# Patient Record
Sex: Male | Born: 1945
Health system: Southern US, Community
[De-identification: ages and names within clinical notes are randomized; demographics above are authoritative.]

## PROBLEM LIST (undated history)

## (undated) DIAGNOSIS — F32A Depression, unspecified: Secondary | ICD-10-CM

## (undated) DIAGNOSIS — H50012 Monocular esotropia, left eye: Secondary | ICD-10-CM

## (undated) DIAGNOSIS — G629 Polyneuropathy, unspecified: Secondary | ICD-10-CM

## (undated) DIAGNOSIS — K5909 Other constipation: Secondary | ICD-10-CM

## (undated) DIAGNOSIS — I639 Cerebral infarction, unspecified: Secondary | ICD-10-CM

## (undated) DIAGNOSIS — M1990 Unspecified osteoarthritis, unspecified site: Secondary | ICD-10-CM

## (undated) DIAGNOSIS — K2270 Barrett's esophagus without dysplasia: Secondary | ICD-10-CM

## (undated) DIAGNOSIS — K30 Functional dyspepsia: Secondary | ICD-10-CM

## (undated) DIAGNOSIS — R609 Edema, unspecified: Secondary | ICD-10-CM

## (undated) DIAGNOSIS — R600 Localized edema: Secondary | ICD-10-CM

## (undated) DIAGNOSIS — G8929 Other chronic pain: Secondary | ICD-10-CM

## (undated) DIAGNOSIS — N400 Enlarged prostate without lower urinary tract symptoms: Secondary | ICD-10-CM

## (undated) DIAGNOSIS — Z923 Personal history of irradiation: Secondary | ICD-10-CM

## (undated) DIAGNOSIS — K635 Polyp of colon: Secondary | ICD-10-CM

## (undated) DIAGNOSIS — R29810 Facial weakness: Secondary | ICD-10-CM

## (undated) DIAGNOSIS — K449 Diaphragmatic hernia without obstruction or gangrene: Secondary | ICD-10-CM

## (undated) DIAGNOSIS — K759 Inflammatory liver disease, unspecified: Secondary | ICD-10-CM

## (undated) DIAGNOSIS — K7581 Nonalcoholic steatohepatitis (NASH): Secondary | ICD-10-CM

## (undated) DIAGNOSIS — C8590 Non-Hodgkin lymphoma, unspecified, unspecified site: Secondary | ICD-10-CM

## (undated) DIAGNOSIS — N1831 Chronic kidney disease, stage 3a: Secondary | ICD-10-CM

## (undated) DIAGNOSIS — K529 Noninfective gastroenteritis and colitis, unspecified: Secondary | ICD-10-CM

## (undated) DIAGNOSIS — E785 Hyperlipidemia, unspecified: Secondary | ICD-10-CM

## (undated) DIAGNOSIS — I700 Atherosclerosis of aorta: Secondary | ICD-10-CM

## (undated) DIAGNOSIS — C07 Malignant neoplasm of parotid gland: Principal | ICD-10-CM

## (undated) DIAGNOSIS — S34112A 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, initial encounter: Secondary | ICD-10-CM

## (undated) DIAGNOSIS — J45909 Unspecified asthma, uncomplicated: Secondary | ICD-10-CM

## (undated) DIAGNOSIS — R1310 Dysphagia, unspecified: Secondary | ICD-10-CM

## (undated) DIAGNOSIS — H5000 Unspecified esotropia: Secondary | ICD-10-CM

## (undated) DIAGNOSIS — M109 Gout, unspecified: Secondary | ICD-10-CM

## (undated) DIAGNOSIS — C3490 Malignant neoplasm of unspecified part of unspecified bronchus or lung: Secondary | ICD-10-CM

## (undated) DIAGNOSIS — H409 Unspecified glaucoma: Secondary | ICD-10-CM

## (undated) DIAGNOSIS — G4700 Insomnia, unspecified: Secondary | ICD-10-CM

## (undated) DIAGNOSIS — K219 Gastro-esophageal reflux disease without esophagitis: Secondary | ICD-10-CM

## (undated) DIAGNOSIS — R21 Rash and other nonspecific skin eruption: Secondary | ICD-10-CM

## (undated) DIAGNOSIS — E119 Type 2 diabetes mellitus without complications: Secondary | ICD-10-CM

## (undated) DIAGNOSIS — K5790 Diverticulosis of intestine, part unspecified, without perforation or abscess without bleeding: Secondary | ICD-10-CM

## (undated) DIAGNOSIS — K648 Other hemorrhoids: Secondary | ICD-10-CM

## (undated) DIAGNOSIS — I10 Essential (primary) hypertension: Secondary | ICD-10-CM

## (undated) DIAGNOSIS — R109 Unspecified abdominal pain: Secondary | ICD-10-CM

## (undated) DIAGNOSIS — N289 Disorder of kidney and ureter, unspecified: Secondary | ICD-10-CM

## (undated) HISTORY — DX: Essential (primary) hypertension: I10

## (undated) HISTORY — PX: CHOLECYSTECTOMY: SHX55

## (undated) HISTORY — DX: Polyp of colon: K63.5

## (undated) HISTORY — DX: Gastro-esophageal reflux disease without esophagitis: K21.9

## (undated) HISTORY — DX: Inflammatory liver disease, unspecified: K75.9

## (undated) HISTORY — DX: Other hemorrhoids: K64.8

## (undated) HISTORY — DX: Cerebral infarction, unspecified: I63.9

## (undated) HISTORY — PX: OTHER SURGICAL HISTORY: SHX169

## (undated) HISTORY — DX: Diverticulosis of intestine, part unspecified, without perforation or abscess without bleeding: K57.90

## (undated) HISTORY — DX: Barrett's esophagus without dysplasia: K22.70

## (undated) HISTORY — DX: Hyperlipidemia, unspecified: E78.5

## (undated) HISTORY — PX: PLEURECTOMY: SHX5081

## (undated) HISTORY — DX: Non-Hodgkin lymphoma, unspecified, unspecified site: C85.90

## (undated) HISTORY — DX: 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, initial encounter: S34.112A

## (undated) HISTORY — DX: Type 2 diabetes mellitus without complications: E11.9

## (undated) HISTORY — DX: Nonalcoholic steatohepatitis (NASH): K75.81

## (undated) HISTORY — DX: Diaphragmatic hernia without obstruction or gangrene: K44.9

---

## 1972-12-12 DIAGNOSIS — C8590 Non-Hodgkin lymphoma, unspecified, unspecified site: Secondary | ICD-10-CM

## 1972-12-12 DIAGNOSIS — Z923 Personal history of irradiation: Secondary | ICD-10-CM

## 1972-12-12 HISTORY — DX: Personal history of irradiation: Z92.3

## 1972-12-12 HISTORY — DX: Non-Hodgkin lymphoma, unspecified, unspecified site: C85.90

## 1996-12-12 DIAGNOSIS — I639 Cerebral infarction, unspecified: Secondary | ICD-10-CM

## 1996-12-12 HISTORY — DX: Cerebral infarction, unspecified: I63.9

## 1998-12-03 ENCOUNTER — Ambulatory Visit (HOSPITAL_COMMUNITY): Admission: RE | Admit: 1998-12-03 | Discharge: 1998-12-03 | Payer: Self-pay | Admitting: Family Medicine

## 1998-12-03 ENCOUNTER — Encounter: Payer: Self-pay | Admitting: Family Medicine

## 2001-12-07 ENCOUNTER — Emergency Department (HOSPITAL_COMMUNITY): Admission: EM | Admit: 2001-12-07 | Discharge: 2001-12-07 | Payer: Self-pay | Admitting: *Deleted

## 2002-01-22 ENCOUNTER — Encounter: Payer: Self-pay | Admitting: Internal Medicine

## 2002-01-22 ENCOUNTER — Ambulatory Visit (HOSPITAL_COMMUNITY): Admission: RE | Admit: 2002-01-22 | Discharge: 2002-01-22 | Payer: Self-pay | Admitting: Internal Medicine

## 2002-02-18 ENCOUNTER — Encounter: Payer: Self-pay | Admitting: Family Medicine

## 2002-02-18 ENCOUNTER — Ambulatory Visit (HOSPITAL_COMMUNITY): Admission: RE | Admit: 2002-02-18 | Discharge: 2002-02-18 | Payer: Self-pay | Admitting: Family Medicine

## 2002-05-02 ENCOUNTER — Encounter: Payer: Self-pay | Admitting: Family Medicine

## 2002-05-02 ENCOUNTER — Ambulatory Visit (HOSPITAL_COMMUNITY): Admission: RE | Admit: 2002-05-02 | Discharge: 2002-05-02 | Payer: Self-pay | Admitting: Family Medicine

## 2002-05-29 ENCOUNTER — Ambulatory Visit (HOSPITAL_COMMUNITY): Admission: RE | Admit: 2002-05-29 | Discharge: 2002-05-29 | Payer: Self-pay | Admitting: General Surgery

## 2002-09-11 ENCOUNTER — Encounter: Payer: Self-pay | Admitting: Specialist

## 2002-09-11 ENCOUNTER — Ambulatory Visit (HOSPITAL_COMMUNITY): Admission: RE | Admit: 2002-09-11 | Discharge: 2002-09-11 | Payer: Self-pay | Admitting: Specialist

## 2002-10-03 ENCOUNTER — Ambulatory Visit (HOSPITAL_COMMUNITY): Admission: RE | Admit: 2002-10-03 | Discharge: 2002-10-03 | Payer: Self-pay | Admitting: Family Medicine

## 2002-10-03 ENCOUNTER — Encounter: Payer: Self-pay | Admitting: Family Medicine

## 2002-10-20 ENCOUNTER — Emergency Department (HOSPITAL_COMMUNITY): Admission: EM | Admit: 2002-10-20 | Discharge: 2002-10-20 | Payer: Self-pay | Admitting: Emergency Medicine

## 2003-06-10 ENCOUNTER — Encounter: Payer: Self-pay | Admitting: Internal Medicine

## 2003-06-10 ENCOUNTER — Ambulatory Visit (HOSPITAL_COMMUNITY): Admission: RE | Admit: 2003-06-10 | Discharge: 2003-06-10 | Payer: Self-pay | Admitting: Internal Medicine

## 2003-07-26 ENCOUNTER — Emergency Department (HOSPITAL_COMMUNITY): Admission: EM | Admit: 2003-07-26 | Discharge: 2003-07-26 | Payer: Self-pay | Admitting: Emergency Medicine

## 2003-07-27 ENCOUNTER — Encounter: Payer: Self-pay | Admitting: *Deleted

## 2003-07-27 ENCOUNTER — Emergency Department (HOSPITAL_COMMUNITY): Admission: EM | Admit: 2003-07-27 | Discharge: 2003-07-27 | Payer: Self-pay | Admitting: *Deleted

## 2003-07-29 ENCOUNTER — Ambulatory Visit (HOSPITAL_COMMUNITY): Admission: RE | Admit: 2003-07-29 | Discharge: 2003-07-29 | Payer: Self-pay | Admitting: Internal Medicine

## 2003-11-17 ENCOUNTER — Ambulatory Visit (HOSPITAL_COMMUNITY): Admission: RE | Admit: 2003-11-17 | Discharge: 2003-11-17 | Payer: Self-pay | Admitting: Internal Medicine

## 2004-06-01 ENCOUNTER — Emergency Department (HOSPITAL_COMMUNITY): Admission: EM | Admit: 2004-06-01 | Discharge: 2004-06-01 | Payer: Self-pay | Admitting: *Deleted

## 2004-07-03 IMAGING — CT CT ABDOMEN W/ CM
1 of 3 series · 13 of 32 positions shown, 19 images · IV contrast (omnipaque)
Comparison: none

CLINICAL DATA: Right upper quadrant pain.
 CT ABDOMEN WITH CONTRAST
 Scans were performed following the intravenous injection of 100 cc Omnipaque 300 and compared with the prior exam of 10/03/02.  
 Again noted is a tiny cyst in the anterior aspect of the left lobe of the liver measuring approximately 6 mm in diameter, unchanged.  The remainder of the liver parenchyma is normal.  The gallbladder has been removed.  The spleen, pancreas, and adrenal glands demonstrate no significant abnormality.  There are benign appearing cysts on the lower poles of both kidneys.  No adenopathy or other significant abnormality.  The left adrenal gland is minimally more prominent than the right but unchanged.  
 IMPRESSION
 No significant abnormality.  Single tiny stable liver cysts.  Stable cysts in the lower poles of both kidneys.

[Series 9478: — · axial · 0.88mm/px · z∈[+1454,+1704]mm · 13 of 60 slices shown, 19 images]
[im 5/60  soft-tissue]
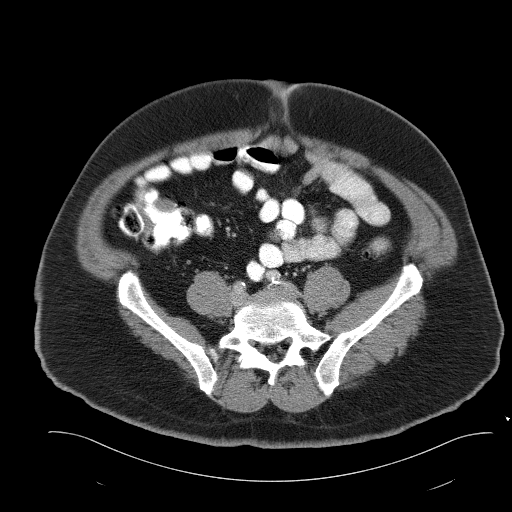
[im 5/60  bone]
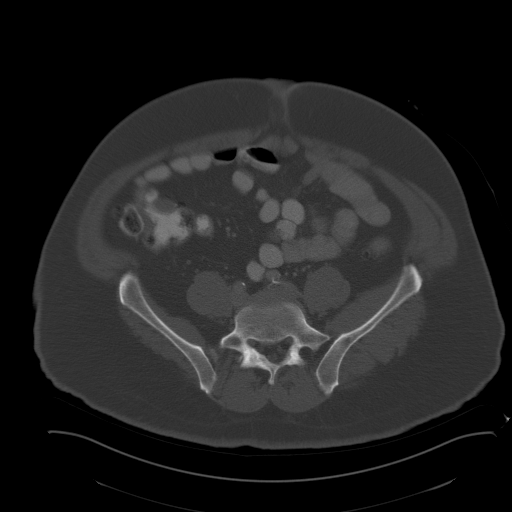
[im 9/60  soft-tissue]
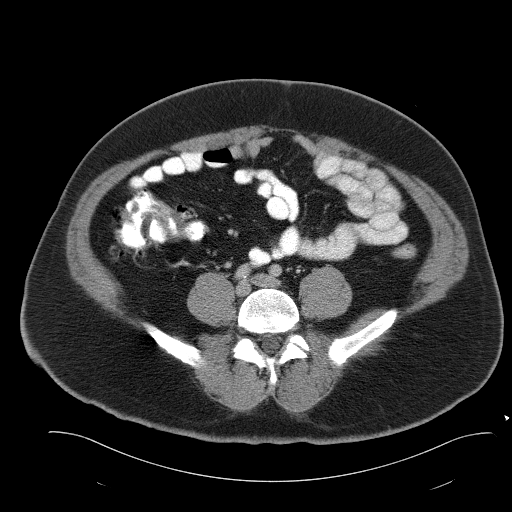
[im 13/60  soft-tissue]
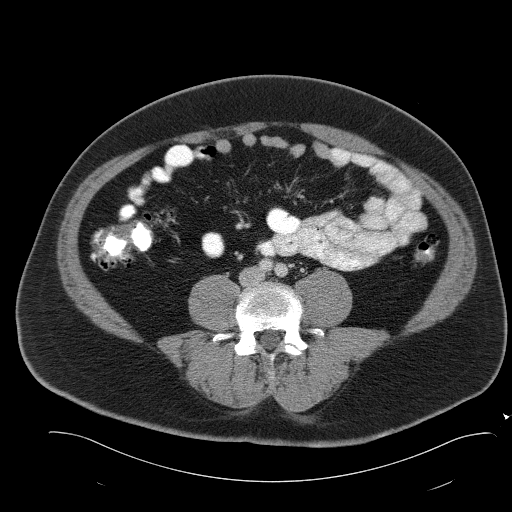
[im 17/60  soft-tissue]
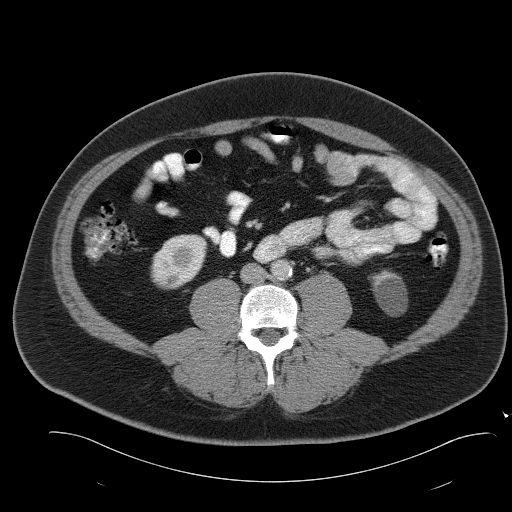
[im 22/60  soft-tissue]
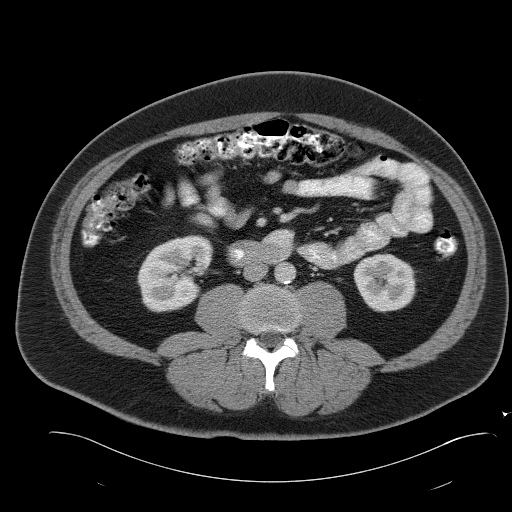
[im 26/60  soft-tissue]
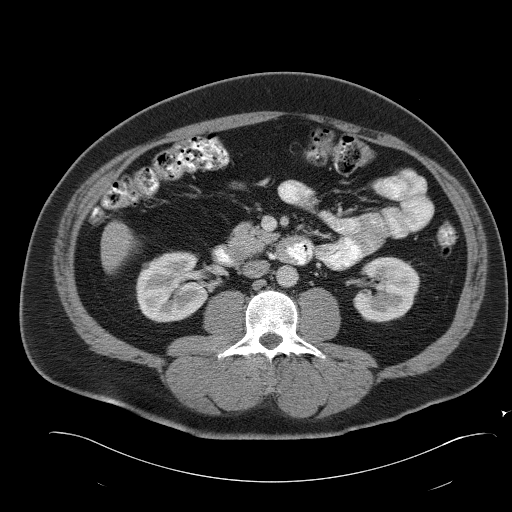
[im 30/60  soft-tissue]
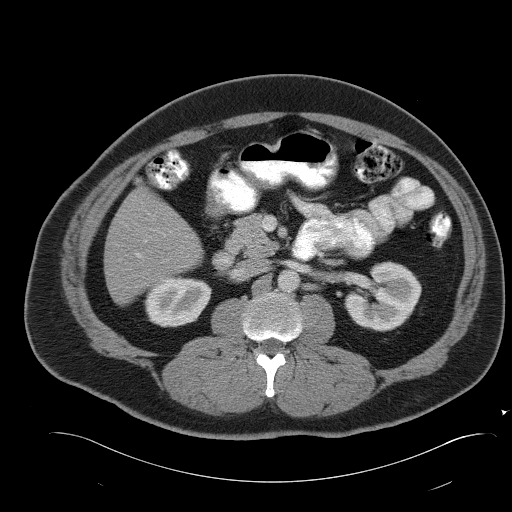
[im 34/60  soft-tissue]
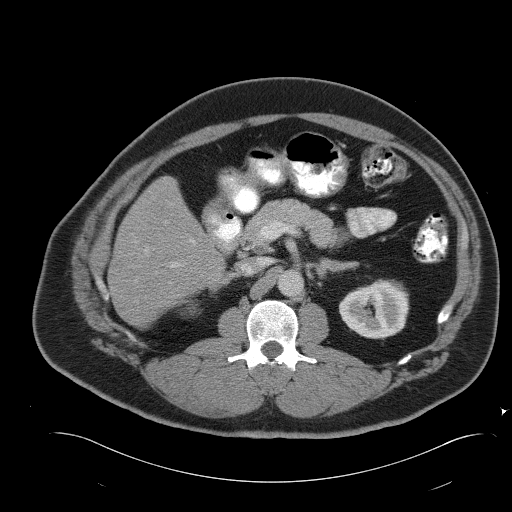
[im 38/60  soft-tissue]
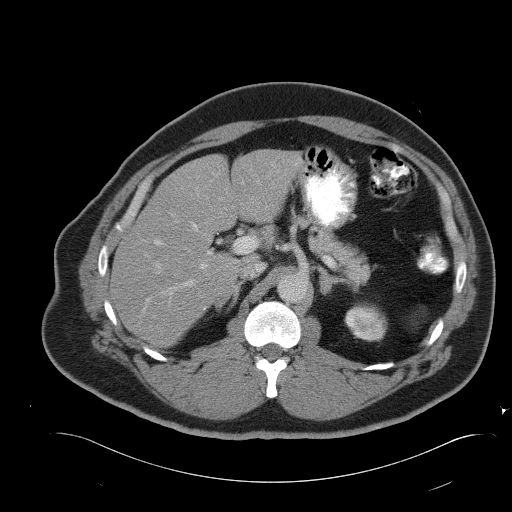
[im 38/60  bone]
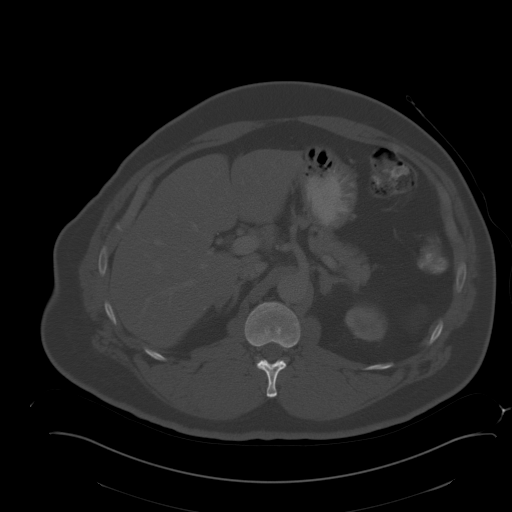
[im 43/60  soft-tissue]
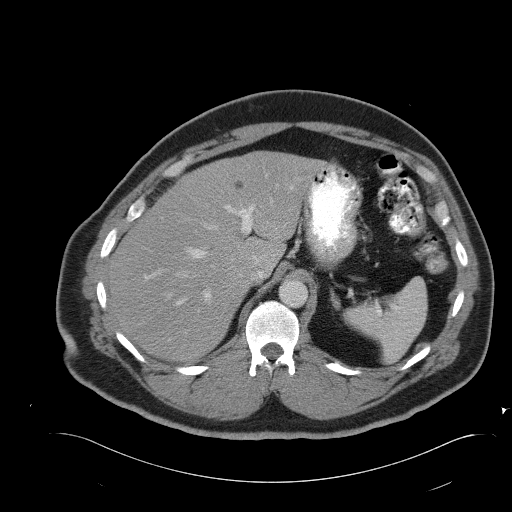
[im 43/60  lung]
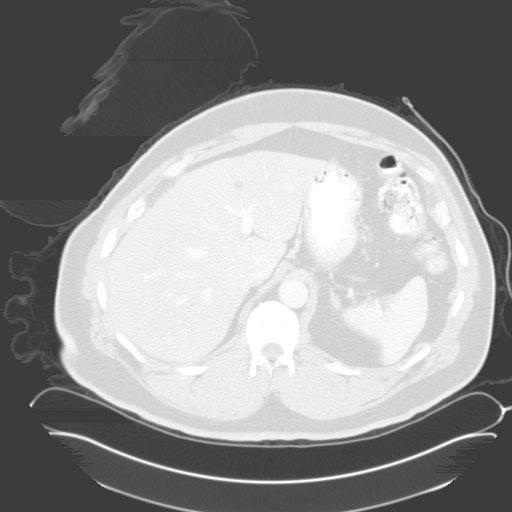
[im 47/60  soft-tissue]
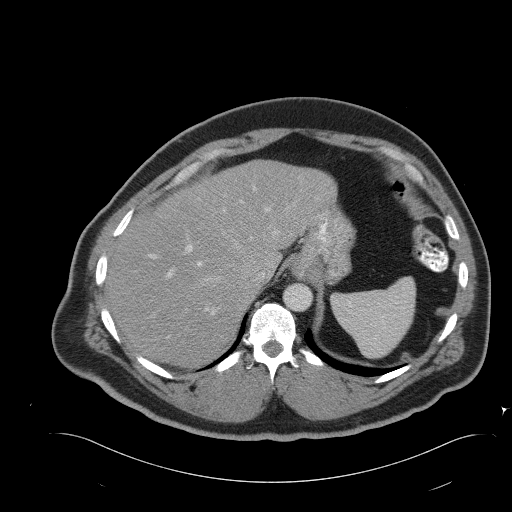
[im 47/60  lung]
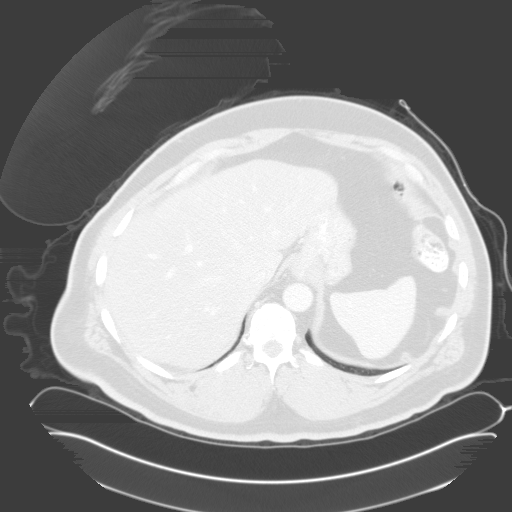
[im 51/60  soft-tissue]
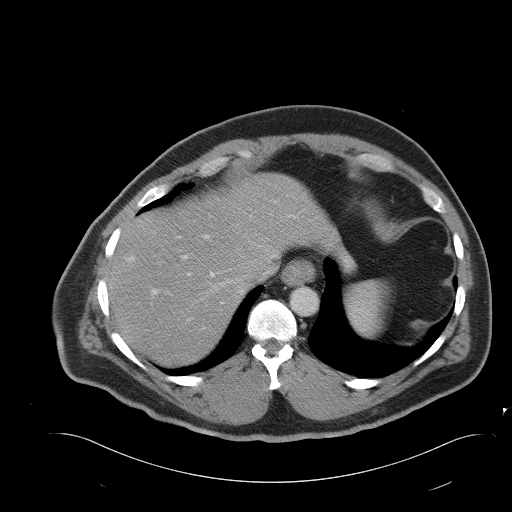
[im 51/60  lung]
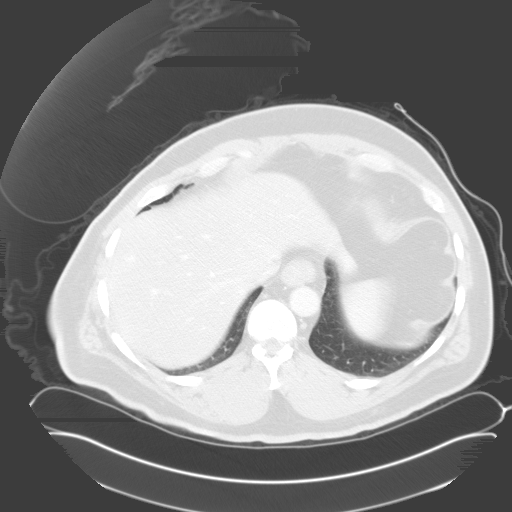
[im 55/60  soft-tissue]
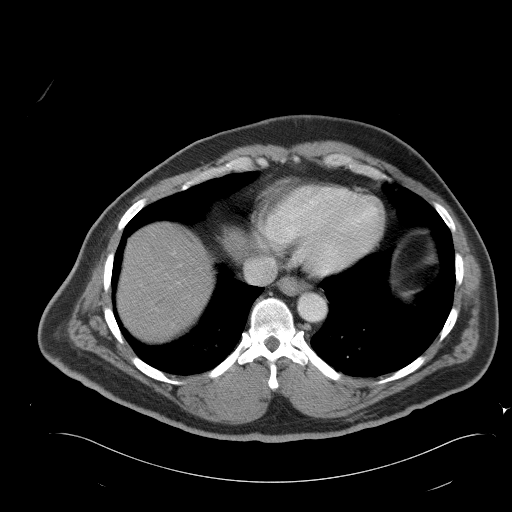
[im 55/60  lung]
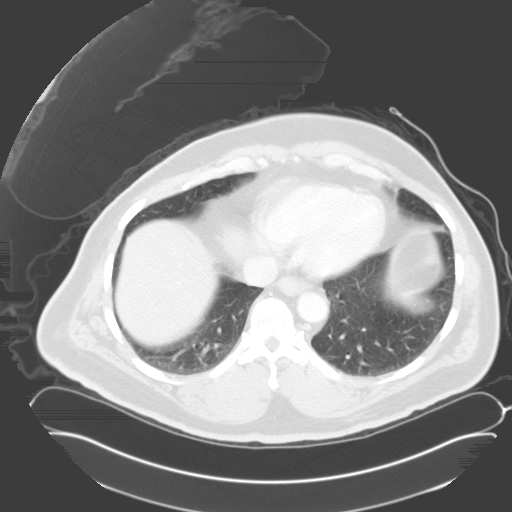

[13 of 32 positions shown; findings below may reference images not displayed]

## 2004-10-12 ENCOUNTER — Emergency Department (HOSPITAL_COMMUNITY): Admission: EM | Admit: 2004-10-12 | Discharge: 2004-10-12 | Payer: Self-pay | Admitting: Emergency Medicine

## 2004-12-09 ENCOUNTER — Emergency Department (HOSPITAL_COMMUNITY): Admission: EM | Admit: 2004-12-09 | Discharge: 2004-12-09 | Payer: Self-pay | Admitting: Emergency Medicine

## 2005-05-29 IMAGING — CT CT PELVIS W/ CM
1 of 3 series · 13 of 32 positions shown, 18 images · IV contrast (CONTRAST)
Comparison: none

CLINICAL DATA: Low mid pelvic pain times two weeks.  Abdominal epigastric pain.  

 ABDOMEN AND PELVIS CT WITH CONTRAST, 10/12/04:
 Comparison 11/17/03.
TECHNIQUE: Contiguous 5 mm axial images were obtained from the lung bases through the pubic symphysis after administration of oral and 100 cc of nonionic intravenous contrast.  
 ABDOMEN:

[Series 521: — · axial · 0.78mm/px · z∈[+1296,+1776]mm · 13 of 109 slices shown, 18 images]
[im 7/109  soft-tissue]
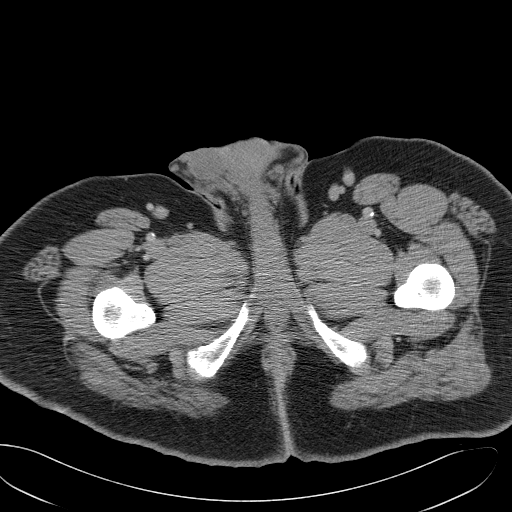
[im 7/109  bone]
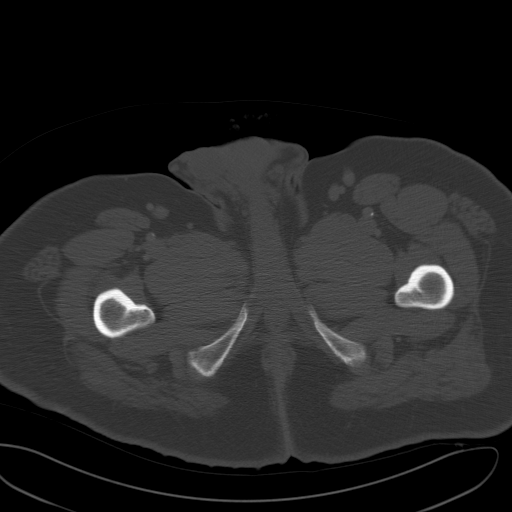
[im 19/109  soft-tissue]
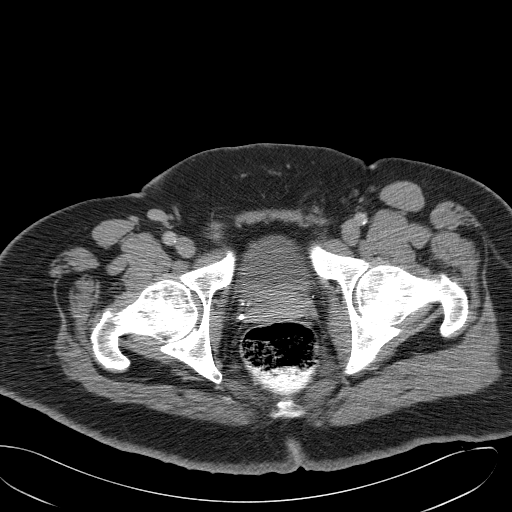
[im 25/109  soft-tissue]
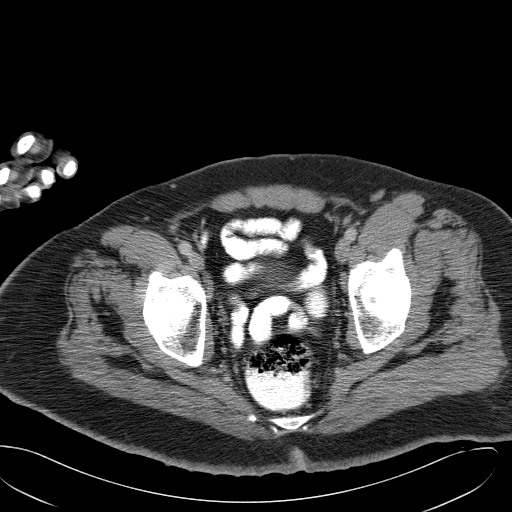
[im 31/109  soft-tissue]
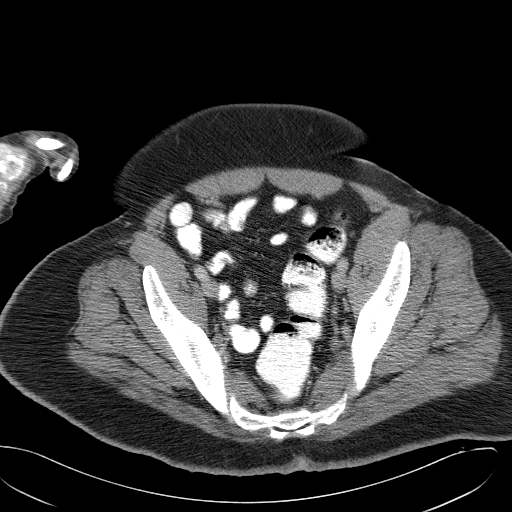
[im 43/109  soft-tissue]
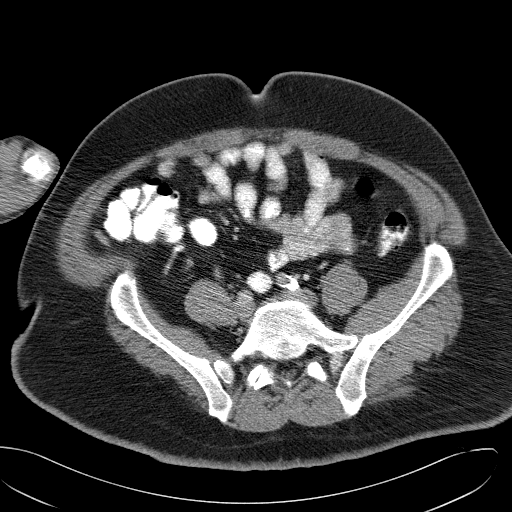
[im 49/109  soft-tissue]
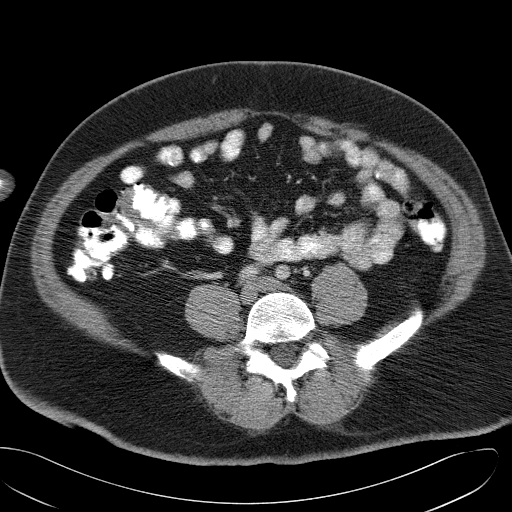
[im 61/109  soft-tissue]
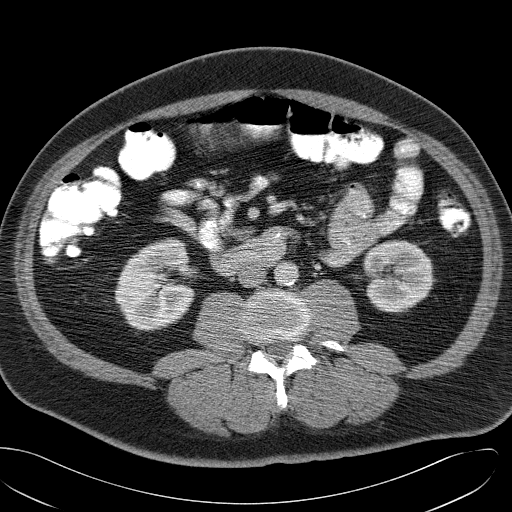
[im 67/109  soft-tissue]
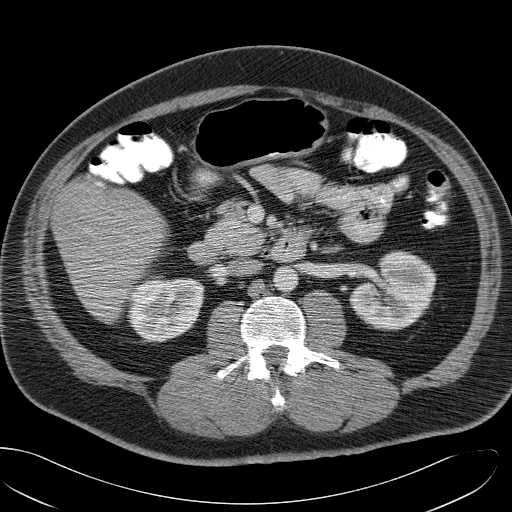
[im 79/109  soft-tissue]
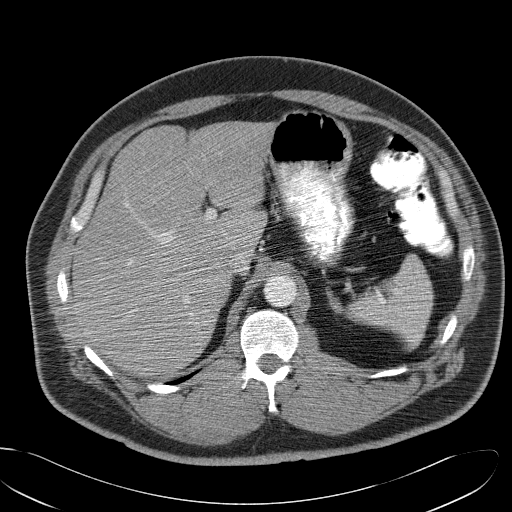
[im 79/109  bone]
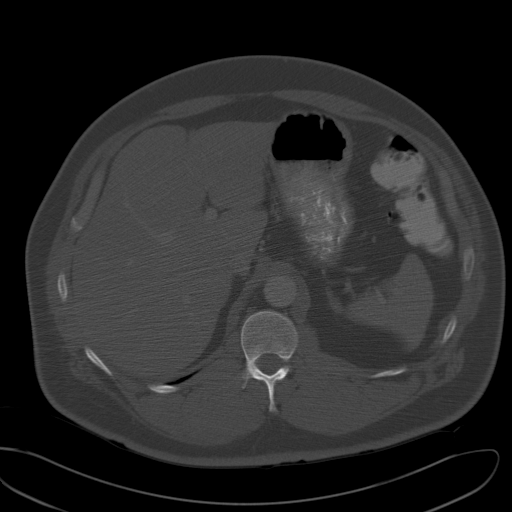
[im 85/109  soft-tissue]
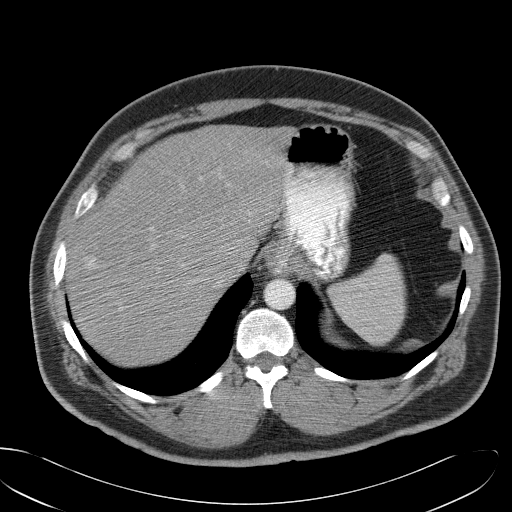
[im 85/109  lung]
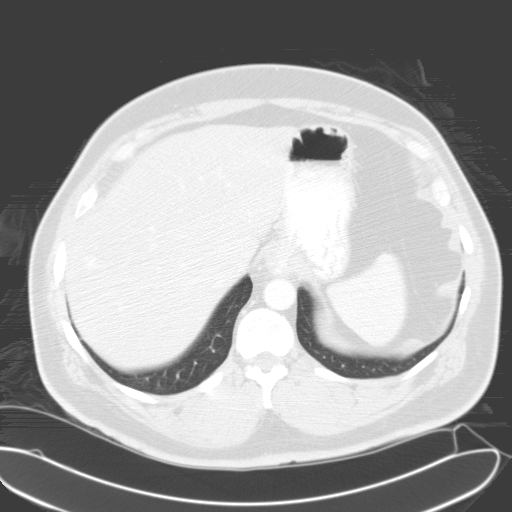
[im 91/109  soft-tissue]
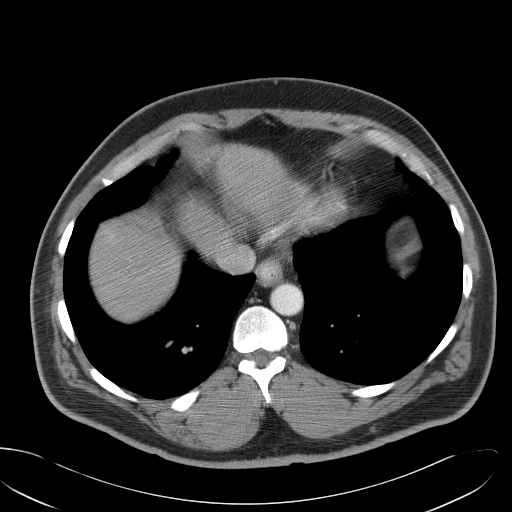
[im 91/109  lung]
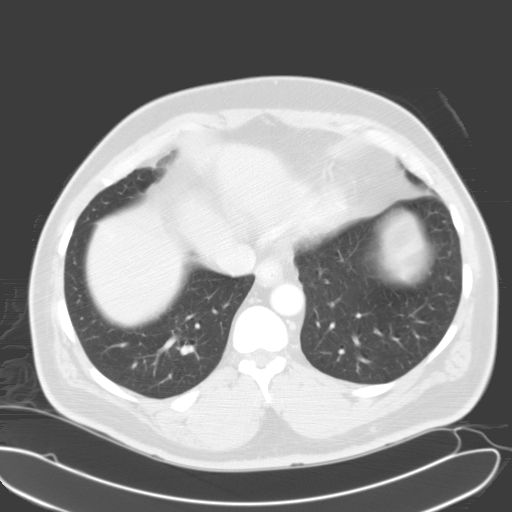
[im 97/109  lung]
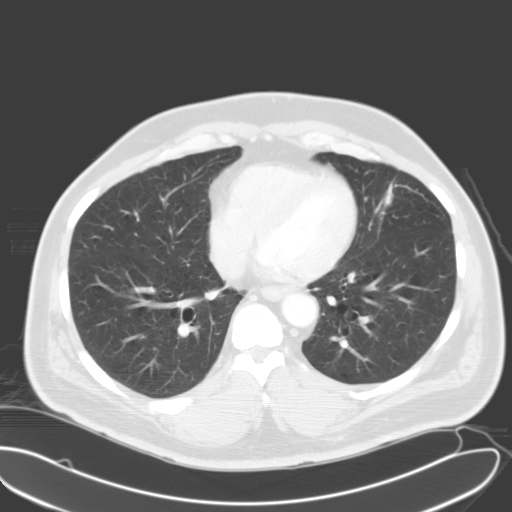
[im 103/109  soft-tissue]
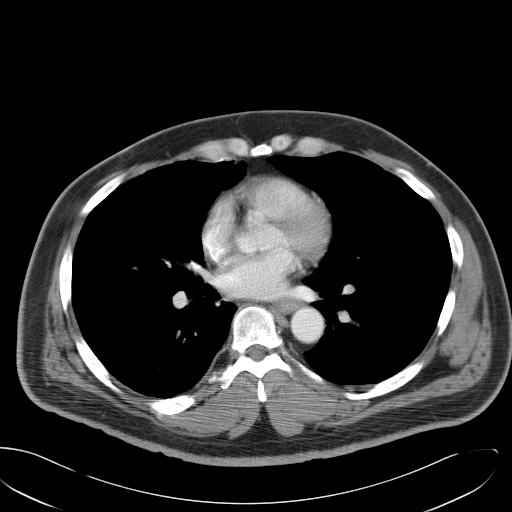
[im 103/109  lung]
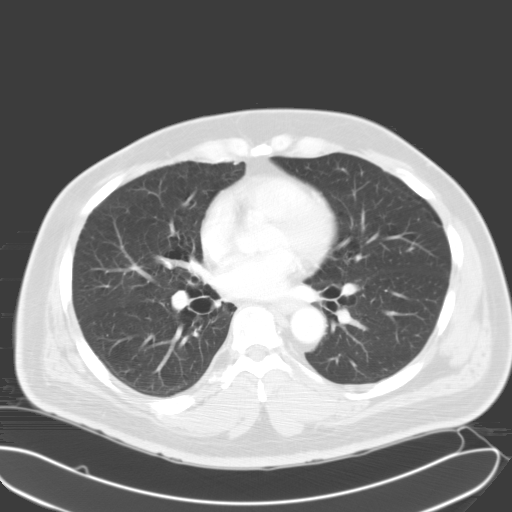

[13 of 32 positions shown; findings below may reference images not displayed]

FINDINGS: Incidental imaging of the lung bases shows bilateral emphysematous change.  There is a 3 mm nodule in the right middle lobe (image 4).  

 Subcentimeter low density lesion in the left liver is unchanged.  11 mm vascular lesion in the anterior segment of the right liver is also stable compared to the study from eleven months ago.  The spleen has normal features.  A hiatal hernia is noted.  Duodenum, pancreas, and right adrenal gland are unremarkable.  There is thickening in the left adrenal gland without a discrete nodule.  1 cm cyst in the lower pole of the right kidney is unchanged. 

 Patient is status post cholecystectomy.
IMPRESSION: Stable exam.  There is a small low density lesion in the left liver most likely a cyst.  An 11 mm hypervascular mass in the anterior segment of the right liver is also unchanged in the one year interval.  This may represent a flash filling hemangioma, FNH, or adenoma.  It is most likely benign given the stability and the nearly one year interval between these two studies.  A follow-up CT scan in one year would be helpful to ensure two years of demonstrated stable follow-up. 

 Bilateral renal cysts. 

 Patient is unable to elevate his right arm which remains at his side generating streak artifact through the provided images and degrading image quality.  

 Tiny nodular opacity in the right middle lobe is most likely related to a calcified granuloma.  A follow-up uninfused CT scan of the chest in three months would be helpful to ensure that this remains stable. 

 PELVIS:
 No free fluid or evidence for lymphadenopathy.  Bladder is decompressed.  Diverticular change is seen in the sigmoid and ascending colon.  The appendix and terminal ileum are unremarkable.
IMPRESSION: Uncomplicated colonic diverticulosis.  Study is otherwise unremarkable. 

 [REDACTED]

## 2005-06-07 ENCOUNTER — Encounter (HOSPITAL_COMMUNITY): Admission: RE | Admit: 2005-06-07 | Discharge: 2005-07-07 | Payer: Self-pay | Admitting: Family Medicine

## 2005-07-26 IMAGING — CR DG KNEE COMPLETE 4+V*L*
4 series · 4 of 4 positions shown · non-contrast
Comparison: none

CLINICAL DATA: Knee pain and swelling.  No known injury.
 LEFT KNEE-FOUR VIEW:
 A joint effusion is present.  There are apparent erosions of the patellofemoral articulation (posterior patella and anterior femur) as seen on the lateral view. No associated soft tissue calcifications.  No fracture.

[view not recorded (1 of 4)]
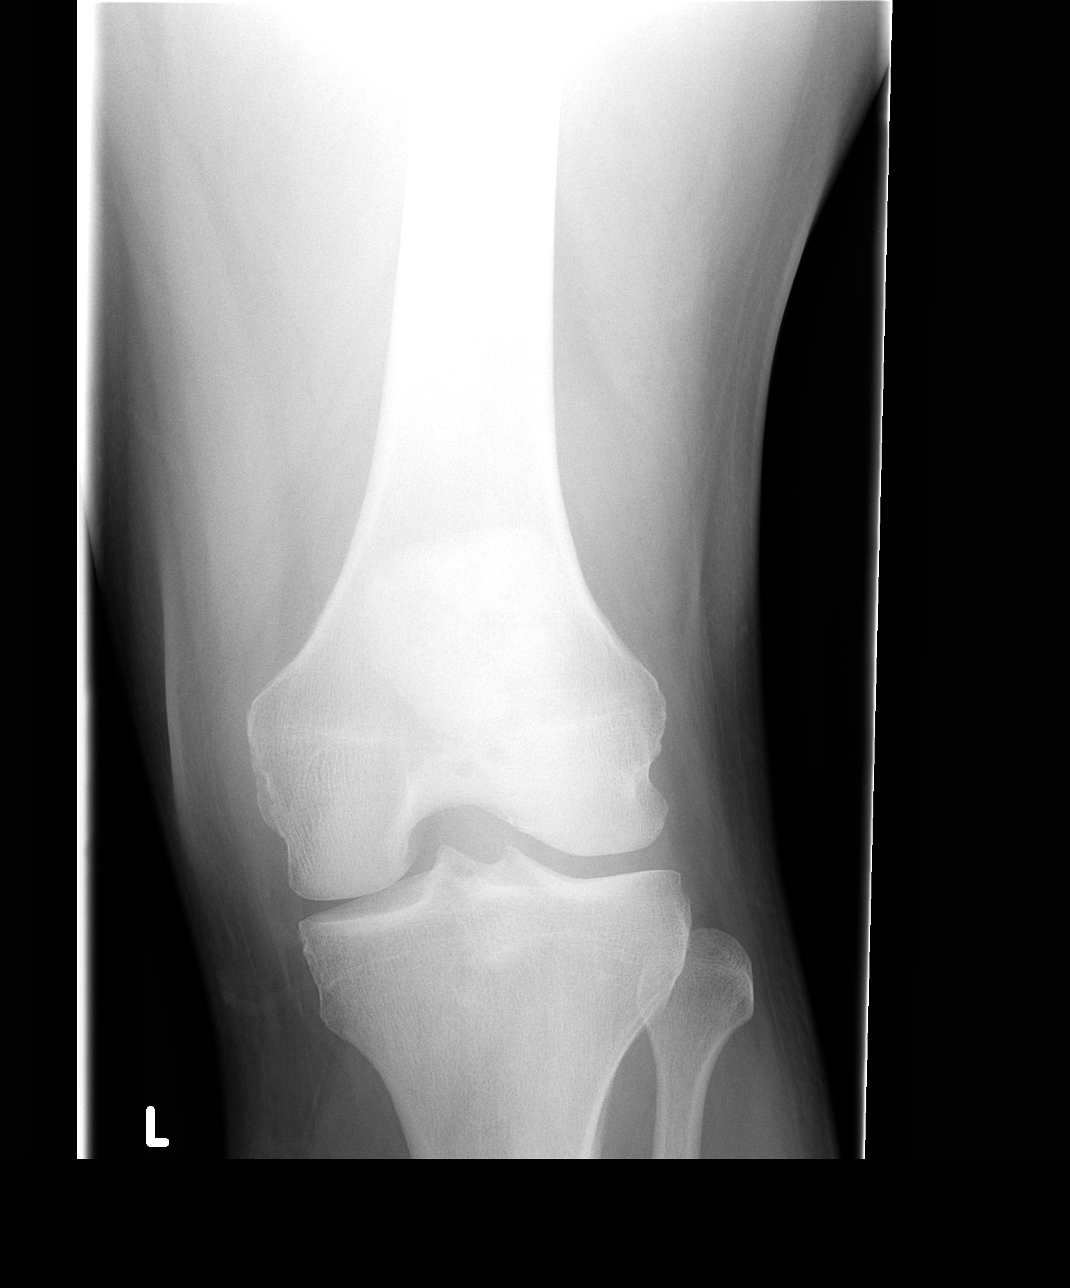

[view not recorded (2 of 4)]
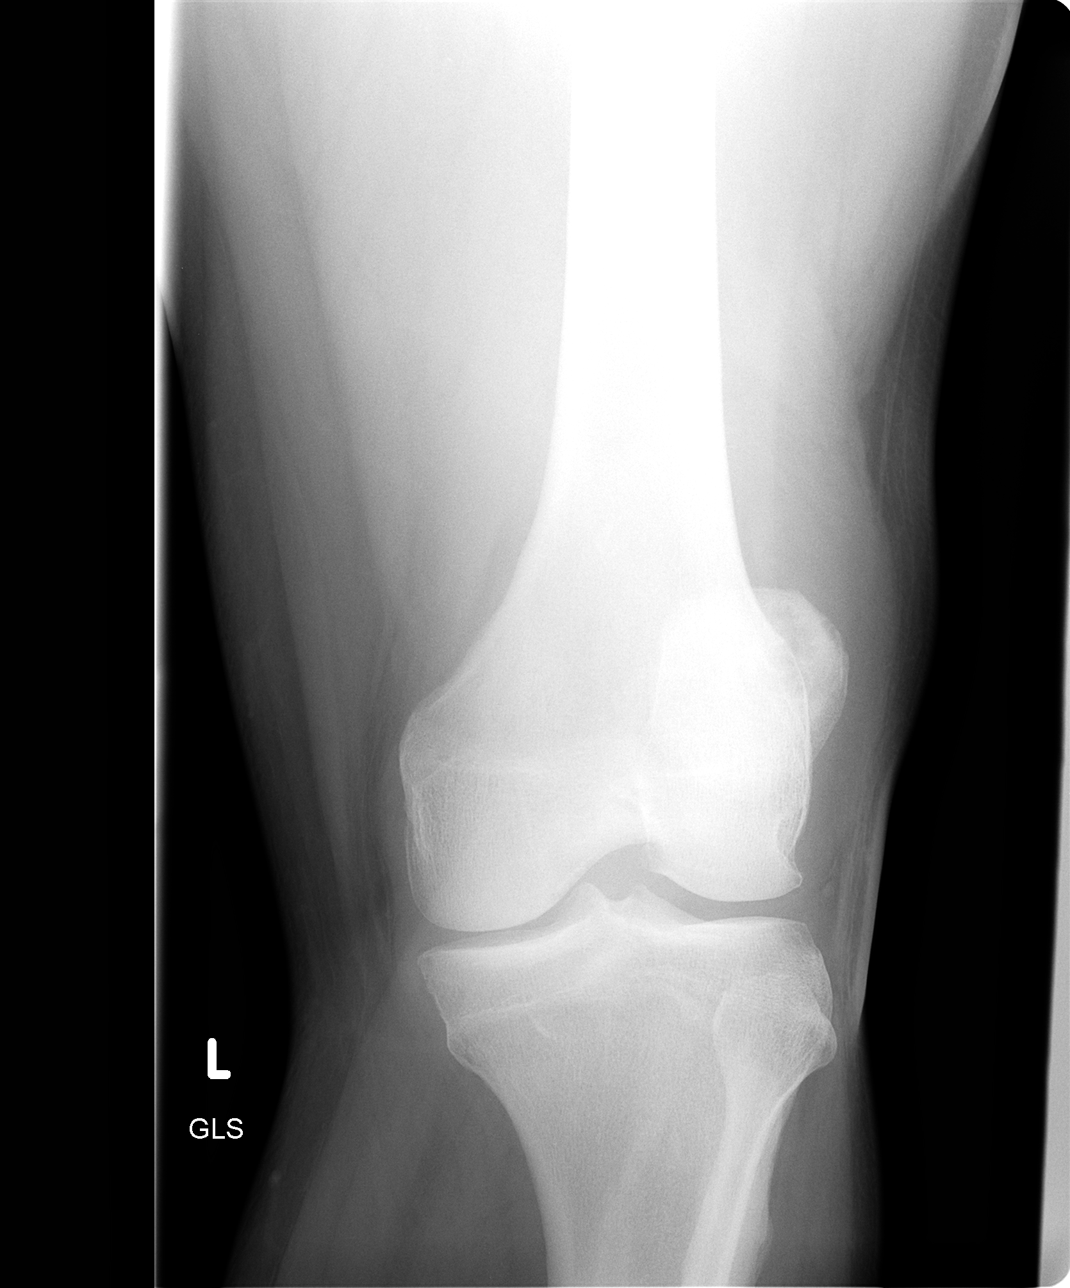

[view not recorded (3 of 4)]
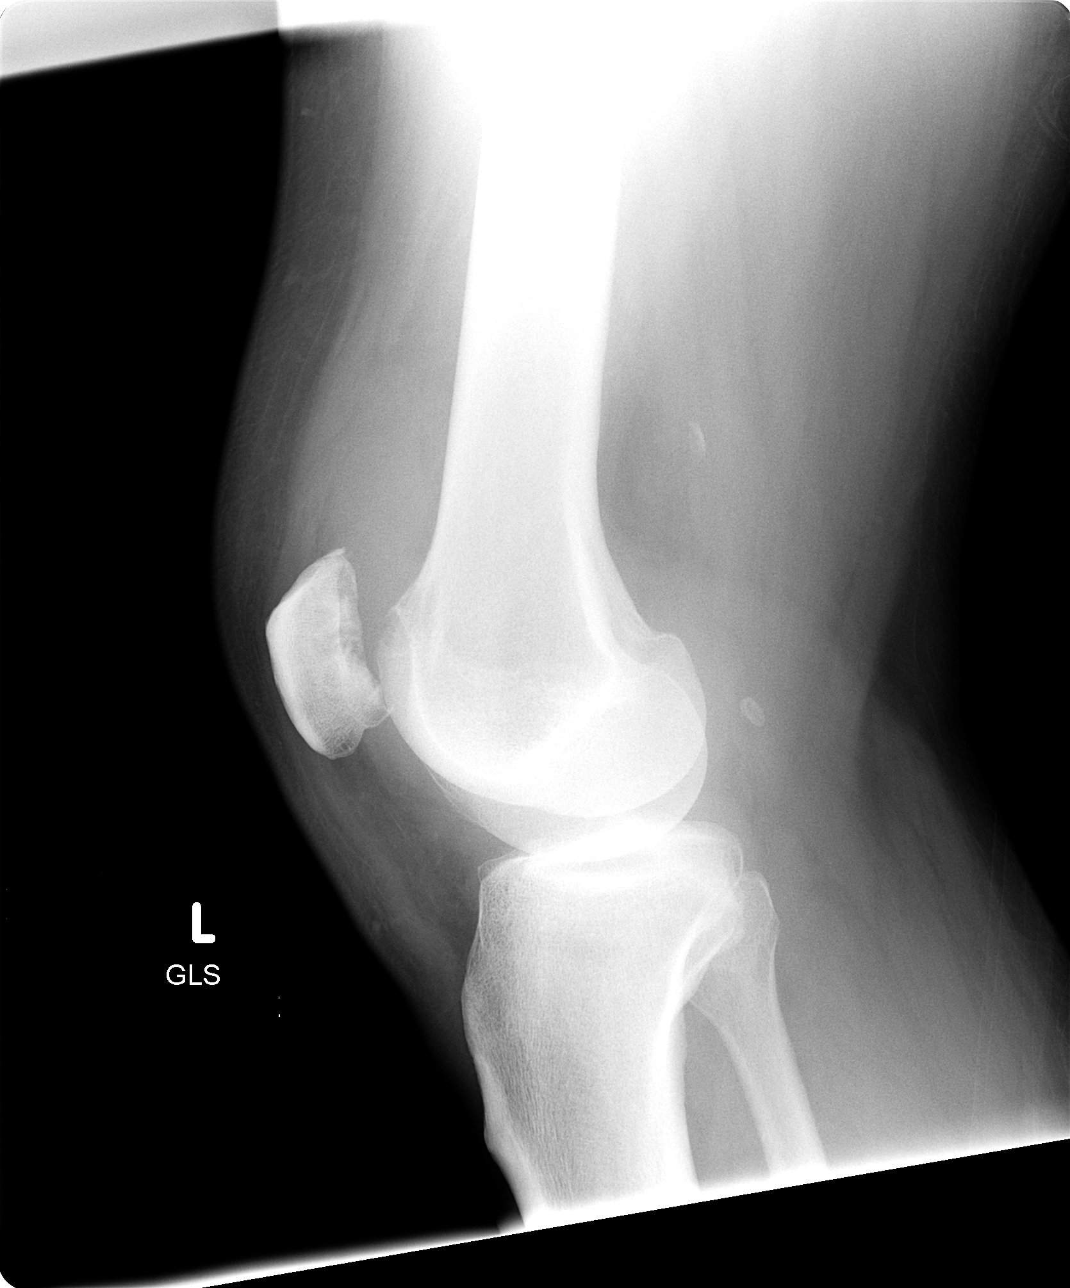

[view not recorded (4 of 4)]
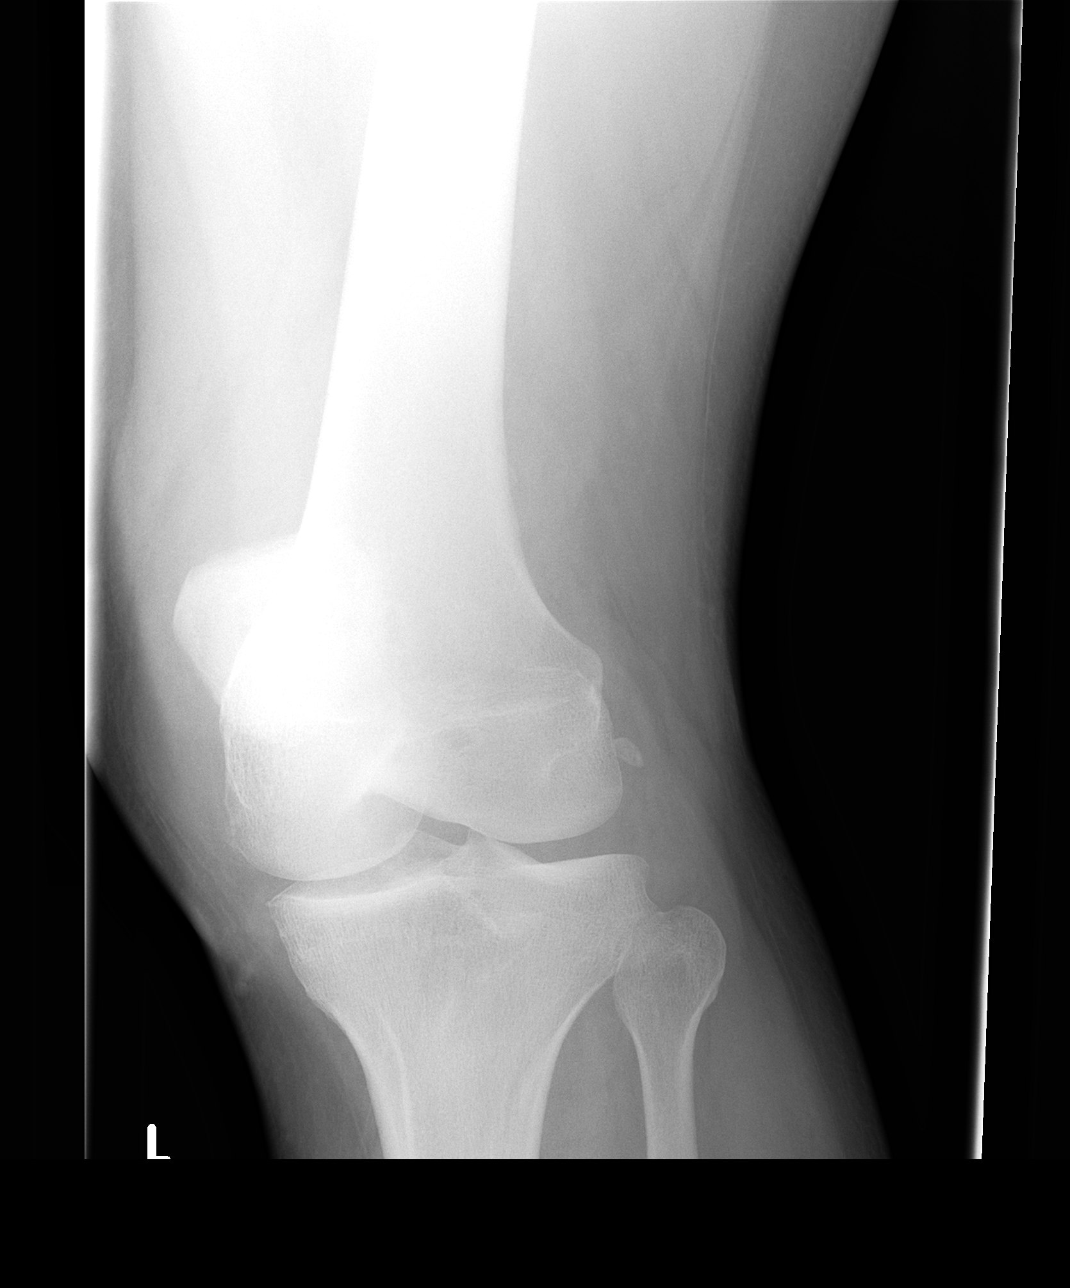

[4 of 4 positions shown; findings below may reference images not displayed]

IMPRESSION: Joint effusion and patellofemoral erosions.  Differential concerns include infection, gout, and pseudogout.

## 2005-08-18 ENCOUNTER — Encounter (HOSPITAL_COMMUNITY): Admission: RE | Admit: 2005-08-18 | Discharge: 2005-09-09 | Payer: Self-pay | Admitting: Family Medicine

## 2005-08-29 ENCOUNTER — Ambulatory Visit (HOSPITAL_COMMUNITY): Admission: RE | Admit: 2005-08-29 | Discharge: 2005-08-29 | Payer: Self-pay | Admitting: Family Medicine

## 2005-09-12 ENCOUNTER — Encounter (HOSPITAL_COMMUNITY): Admission: RE | Admit: 2005-09-12 | Discharge: 2005-09-13 | Payer: Self-pay | Admitting: Family Medicine

## 2006-01-27 ENCOUNTER — Ambulatory Visit (HOSPITAL_COMMUNITY): Admission: RE | Admit: 2006-01-27 | Discharge: 2006-01-27 | Payer: Self-pay | Admitting: Family Medicine

## 2006-02-07 ENCOUNTER — Ambulatory Visit (HOSPITAL_COMMUNITY): Admission: RE | Admit: 2006-02-07 | Discharge: 2006-02-07 | Payer: Self-pay | Admitting: Family Medicine

## 2006-04-11 ENCOUNTER — Emergency Department (HOSPITAL_COMMUNITY): Admission: EM | Admit: 2006-04-11 | Discharge: 2006-04-12 | Payer: Self-pay | Admitting: Emergency Medicine

## 2006-04-15 IMAGING — CR DG FOOT COMPLETE 3+V*L*
3 series · 3 of 3 positions shown · non-contrast
Comparison: none

CLINICAL DATA: Bilateral ankle and foot pain and left wrist pain and stiffness.  
RIGHT FOOT ? 3 VIEWS:

[view not recorded (1 of 3)]
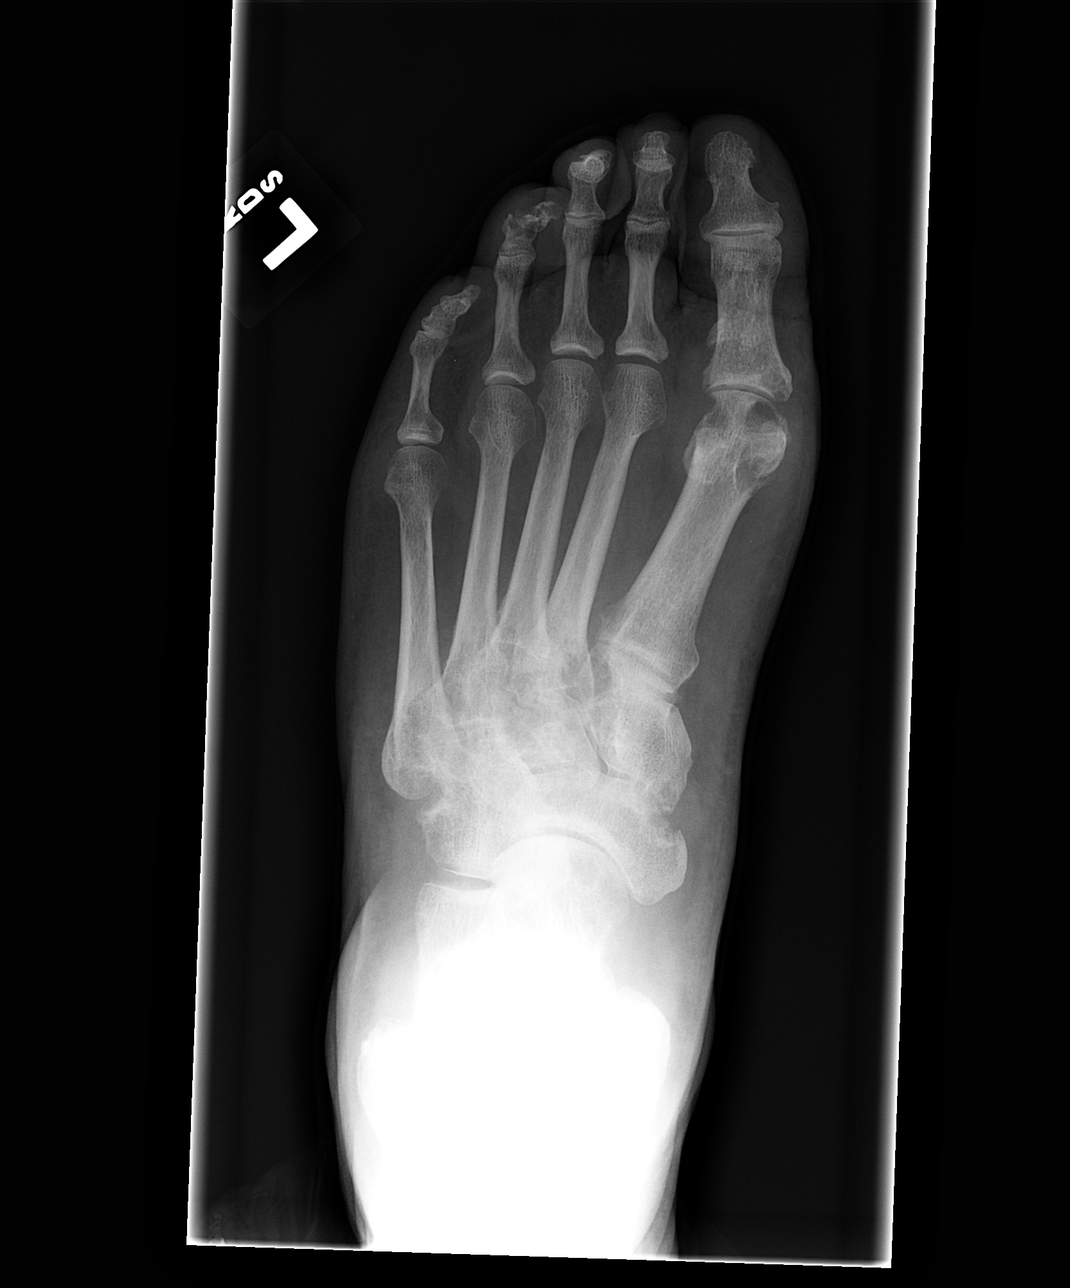

[view not recorded (2 of 3)]
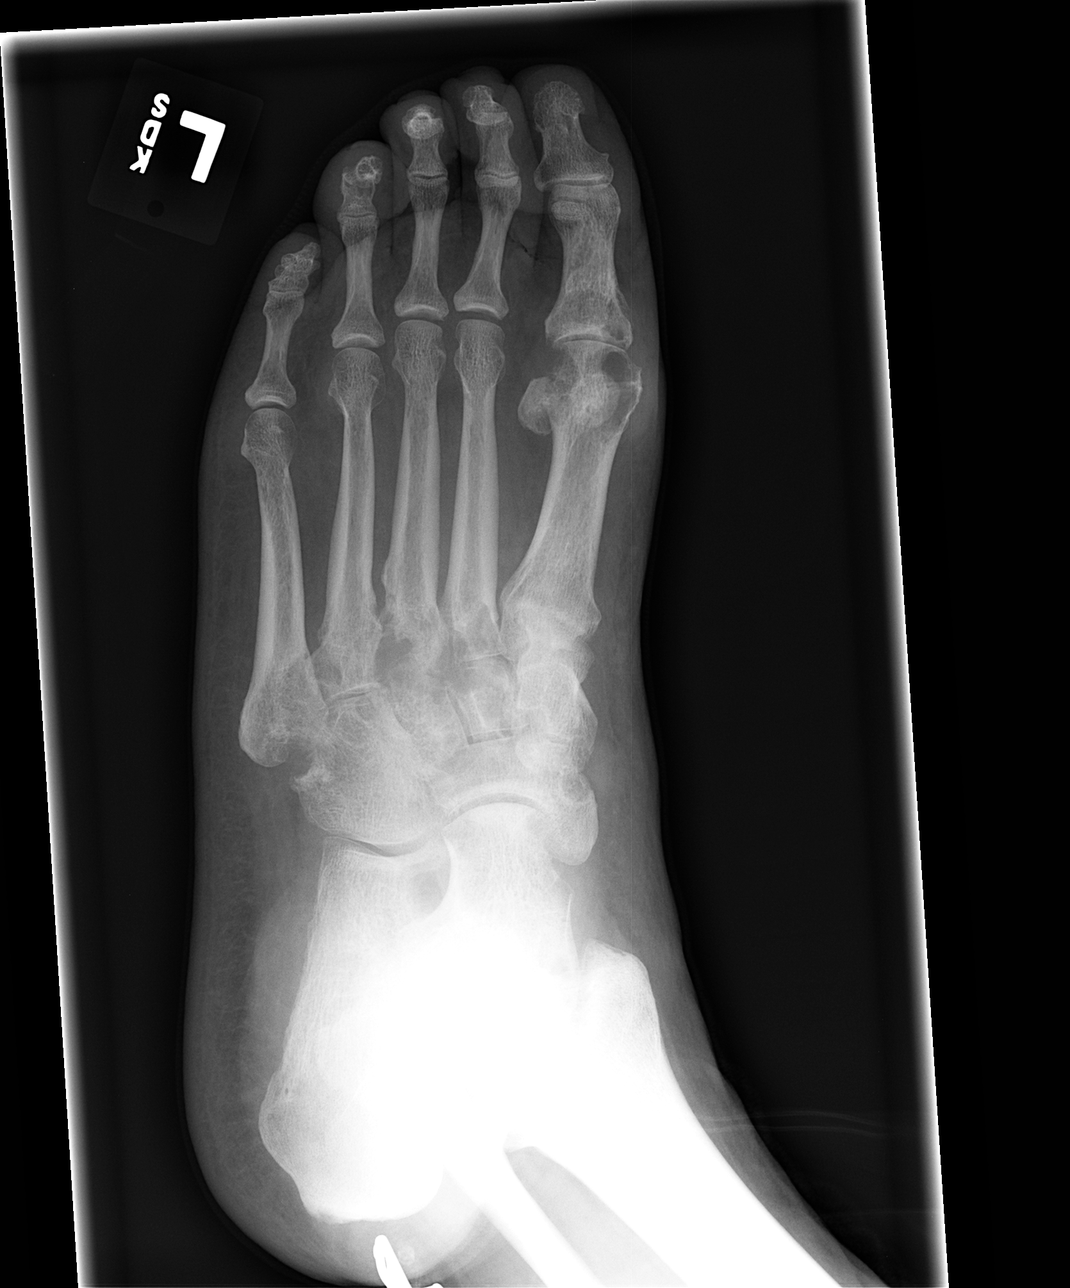

[view not recorded (3 of 3)]
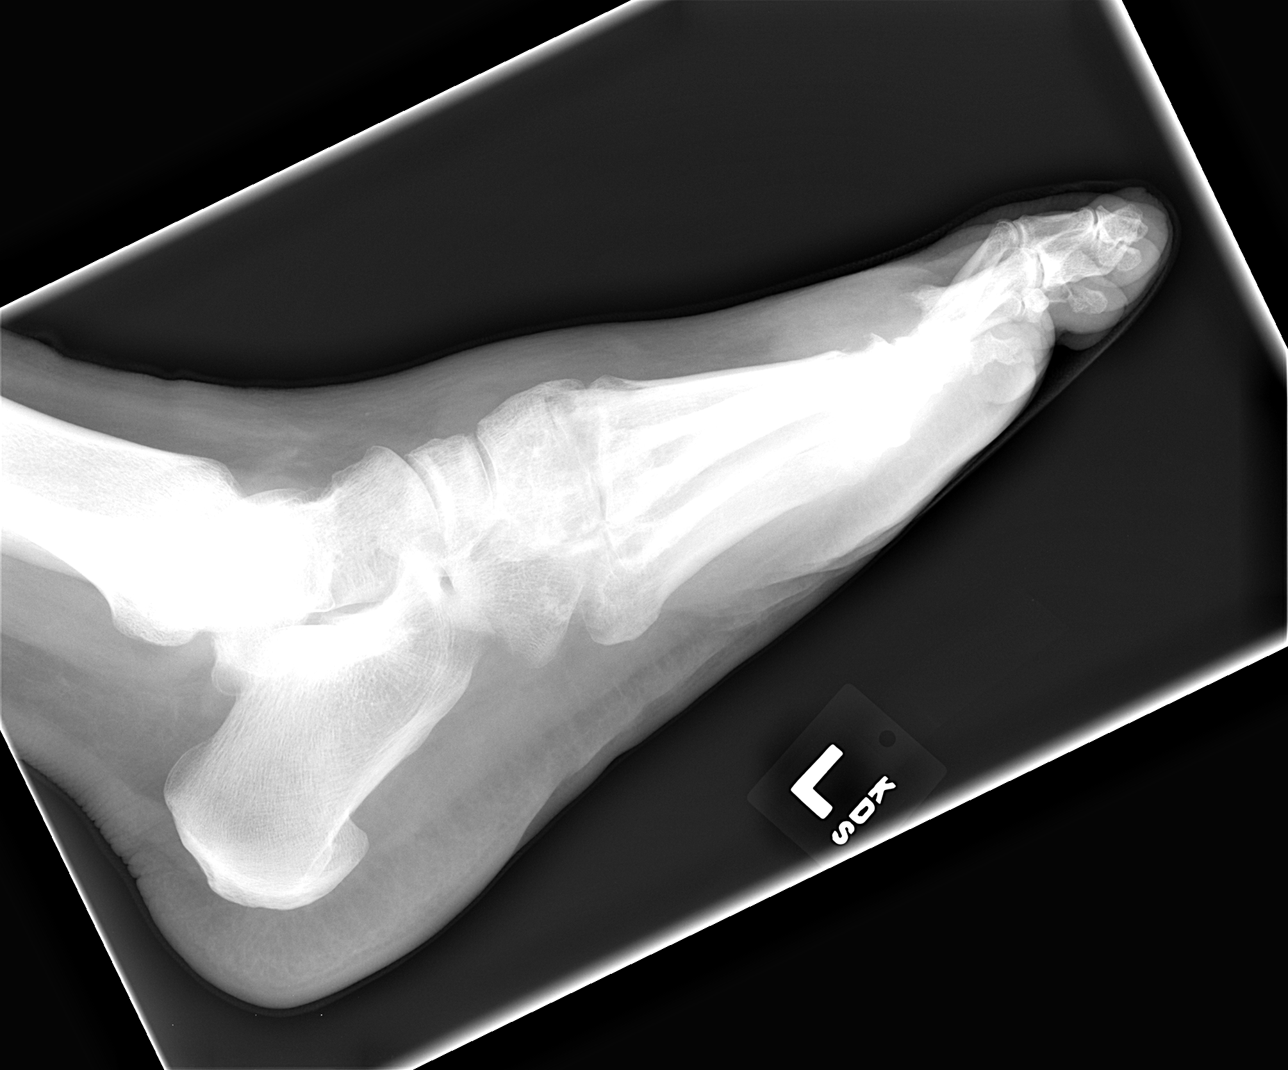

[3 of 3 positions shown; findings below may reference images not displayed]

FINDINGS: There are extensive bony erosions particularly severe at the bases of the 3rd and 4th metatarsals as well as involving the mid and lateral cuneiform.  There are also extensive erosions of the medial aspect of the navicular as well as at the 1st and 2nd metatarsal phalangeal joints.  The head of the 2nd metatarsal is flattened and there appears to be a small pathologic fracture of the head of the 2nd metatarsal.  Findings are consistent with severe gout.  There is a large ankle effusion with soft tissue swelling over the dorsum of the tarsal bones.  There is also marked swelling of the metatarsal phalangeal joints.
IMPRESSION: Severe gout of the foot.  Probable pathologic fracture of the head of the 2nd metatarsal due to the gout.  The bases of the 3rd, 4th, and 5th metatarsals are severely eroded. 
LEFT FOOT ? 3 VIEWS:
FINDINGS: Severe gout is seen at the 1st metatarsal phalangeal joint as well as in the tarsal metatarsal joints with erosions in the navicular, cuneiforms, and bases of the metatarsals.  There is marked soft tissue swelling with marked erosion of the dorsal aspect of the base of the proximal phalangeal bone of the great toe.
IMPRESSION: Severe gout of the left foot.  Numerous large erosions. 
LEFT ANKLE ? 3 VIEWS:
FINDINGS: There is a small ankle effusion without discreet erosions of the ankle joint.  There is some lucency in the medial aspect of the dome of the talus which may represent a subchondral cyst secondary to gout.
IMPRESSION: Joint effusion with probable subchondral cyst of the talus secondary to gout.  Severe gout is seen in the proximal foot as described on the foot x-ray report.
RIGHT ANKLE - 3 VIEWS:
FINDINGS: There is a large right effusion with extensive erosions of the talus, distal tibia, and distal fibula.
IMPRESSION: Severe gout of the right ankle with multiple large erosions and large effusion.
LEFT WRIST - 4 VIEWS:
FINDINGS: There is widening of the scapholunate joint space consistent with scapholunate dissociation.  There is suggestion of some subtle erosions in the dorsal aspect of the triquetrum and lunate.  There is also an erosion of the distal scaphoid seen on the lateral view.
IMPRESSION: Multiple erosions of the carpal bones with scapholunate dissociation.  Findings are consistent with gout.

## 2006-04-15 IMAGING — CR DG WRIST COMPLETE 3+V*L*
2 series · 2 of 2 positions shown · non-contrast
Comparison: none

CLINICAL DATA: Bilateral ankle and foot pain and left wrist pain and stiffness.  
RIGHT FOOT ? 3 VIEWS:

[view not recorded (1 of 2)]
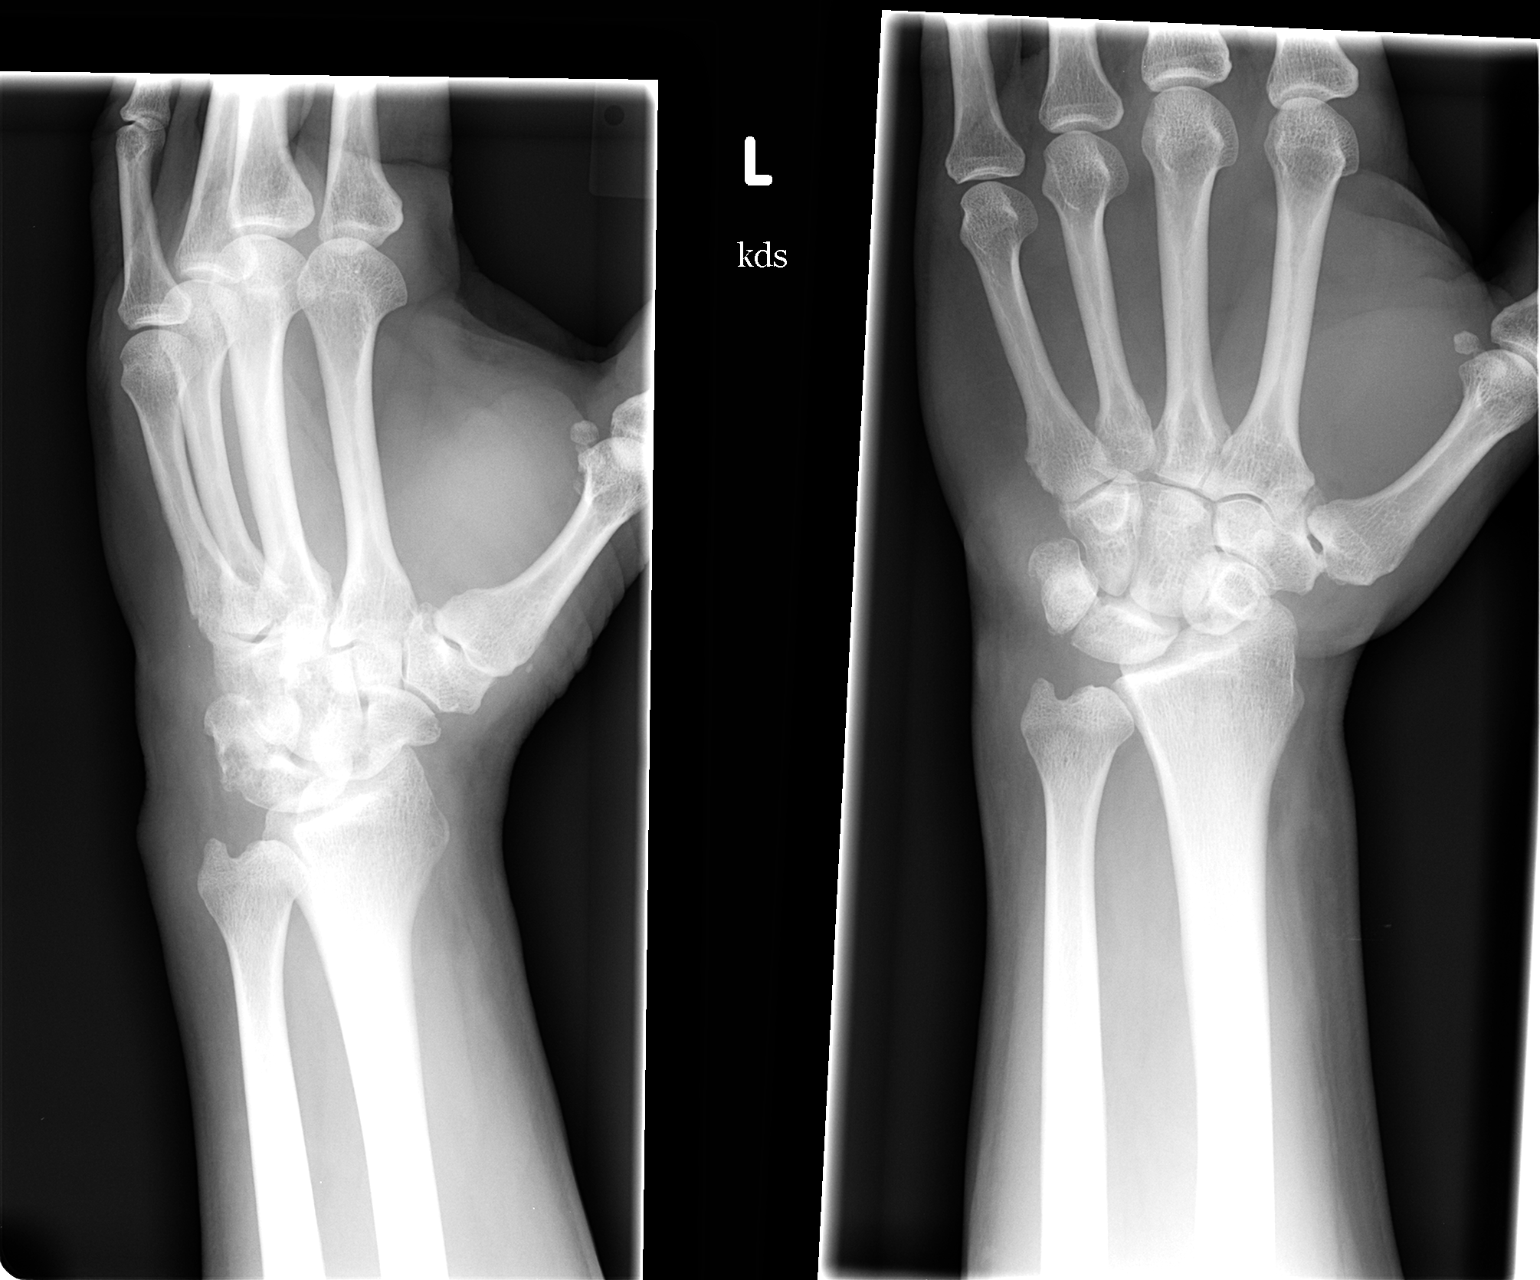

[view not recorded (2 of 2)]
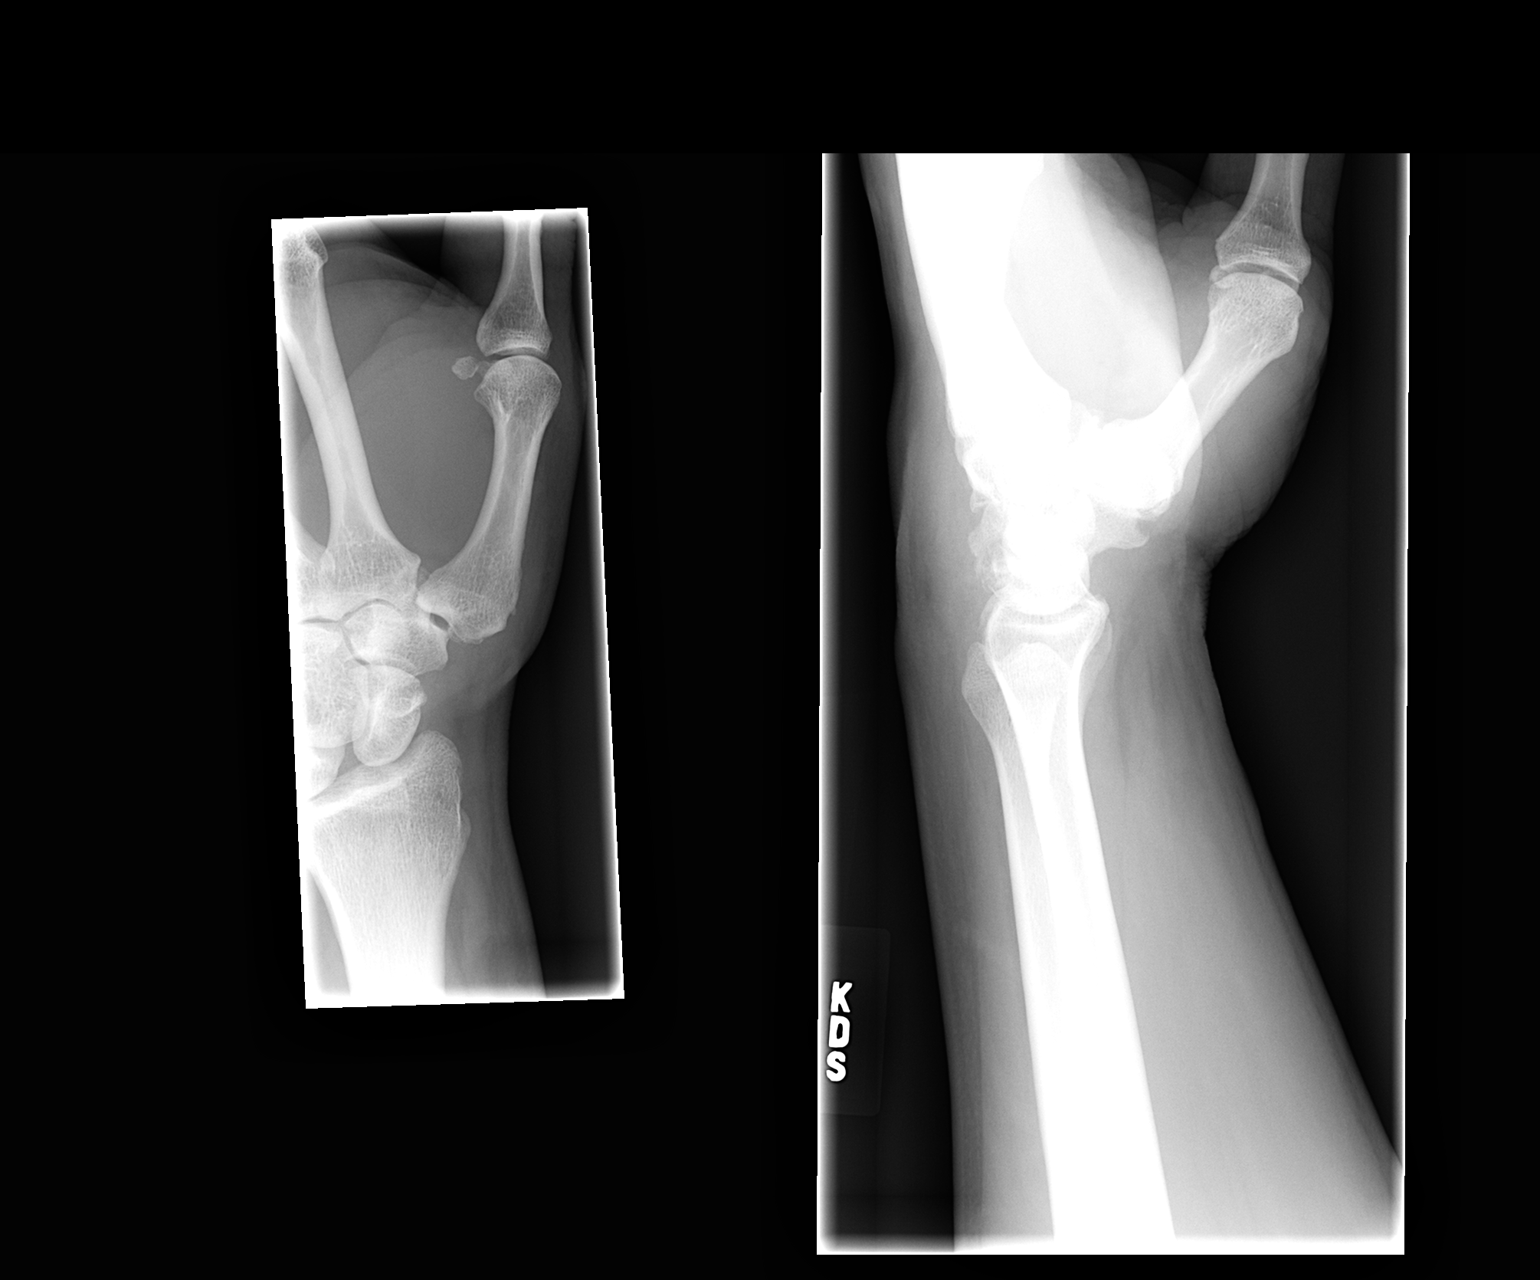

[2 of 2 positions shown; findings below may reference images not displayed]

FINDINGS: There are extensive bony erosions particularly severe at the bases of the 3rd and 4th metatarsals as well as involving the mid and lateral cuneiform.  There are also extensive erosions of the medial aspect of the navicular as well as at the 1st and 2nd metatarsal phalangeal joints.  The head of the 2nd metatarsal is flattened and there appears to be a small pathologic fracture of the head of the 2nd metatarsal.  Findings are consistent with severe gout.  There is a large ankle effusion with soft tissue swelling over the dorsum of the tarsal bones.  There is also marked swelling of the metatarsal phalangeal joints.
IMPRESSION: Severe gout of the foot.  Probable pathologic fracture of the head of the 2nd metatarsal due to the gout.  The bases of the 3rd, 4th, and 5th metatarsals are severely eroded. 
LEFT FOOT ? 3 VIEWS:
FINDINGS: Severe gout is seen at the 1st metatarsal phalangeal joint as well as in the tarsal metatarsal joints with erosions in the navicular, cuneiforms, and bases of the metatarsals.  There is marked soft tissue swelling with marked erosion of the dorsal aspect of the base of the proximal phalangeal bone of the great toe.
IMPRESSION: Severe gout of the left foot.  Numerous large erosions. 
LEFT ANKLE ? 3 VIEWS:
FINDINGS: There is a small ankle effusion without discreet erosions of the ankle joint.  There is some lucency in the medial aspect of the dome of the talus which may represent a subchondral cyst secondary to gout.
IMPRESSION: Joint effusion with probable subchondral cyst of the talus secondary to gout.  Severe gout is seen in the proximal foot as described on the foot x-ray report.
RIGHT ANKLE - 3 VIEWS:
FINDINGS: There is a large right effusion with extensive erosions of the talus, distal tibia, and distal fibula.
IMPRESSION: Severe gout of the right ankle with multiple large erosions and large effusion.
LEFT WRIST - 4 VIEWS:
FINDINGS: There is widening of the scapholunate joint space consistent with scapholunate dissociation.  There is suggestion of some subtle erosions in the dorsal aspect of the triquetrum and lunate.  There is also an erosion of the distal scaphoid seen on the lateral view.
IMPRESSION: Multiple erosions of the carpal bones with scapholunate dissociation.  Findings are consistent with gout.

## 2006-04-15 IMAGING — CR DG ANKLE COMPLETE 3+V*R*
2 series · 2 of 2 positions shown · non-contrast
Comparison: none

CLINICAL DATA: Bilateral ankle and foot pain and left wrist pain and stiffness.  
RIGHT FOOT ? 3 VIEWS:

[view not recorded (1 of 2)]
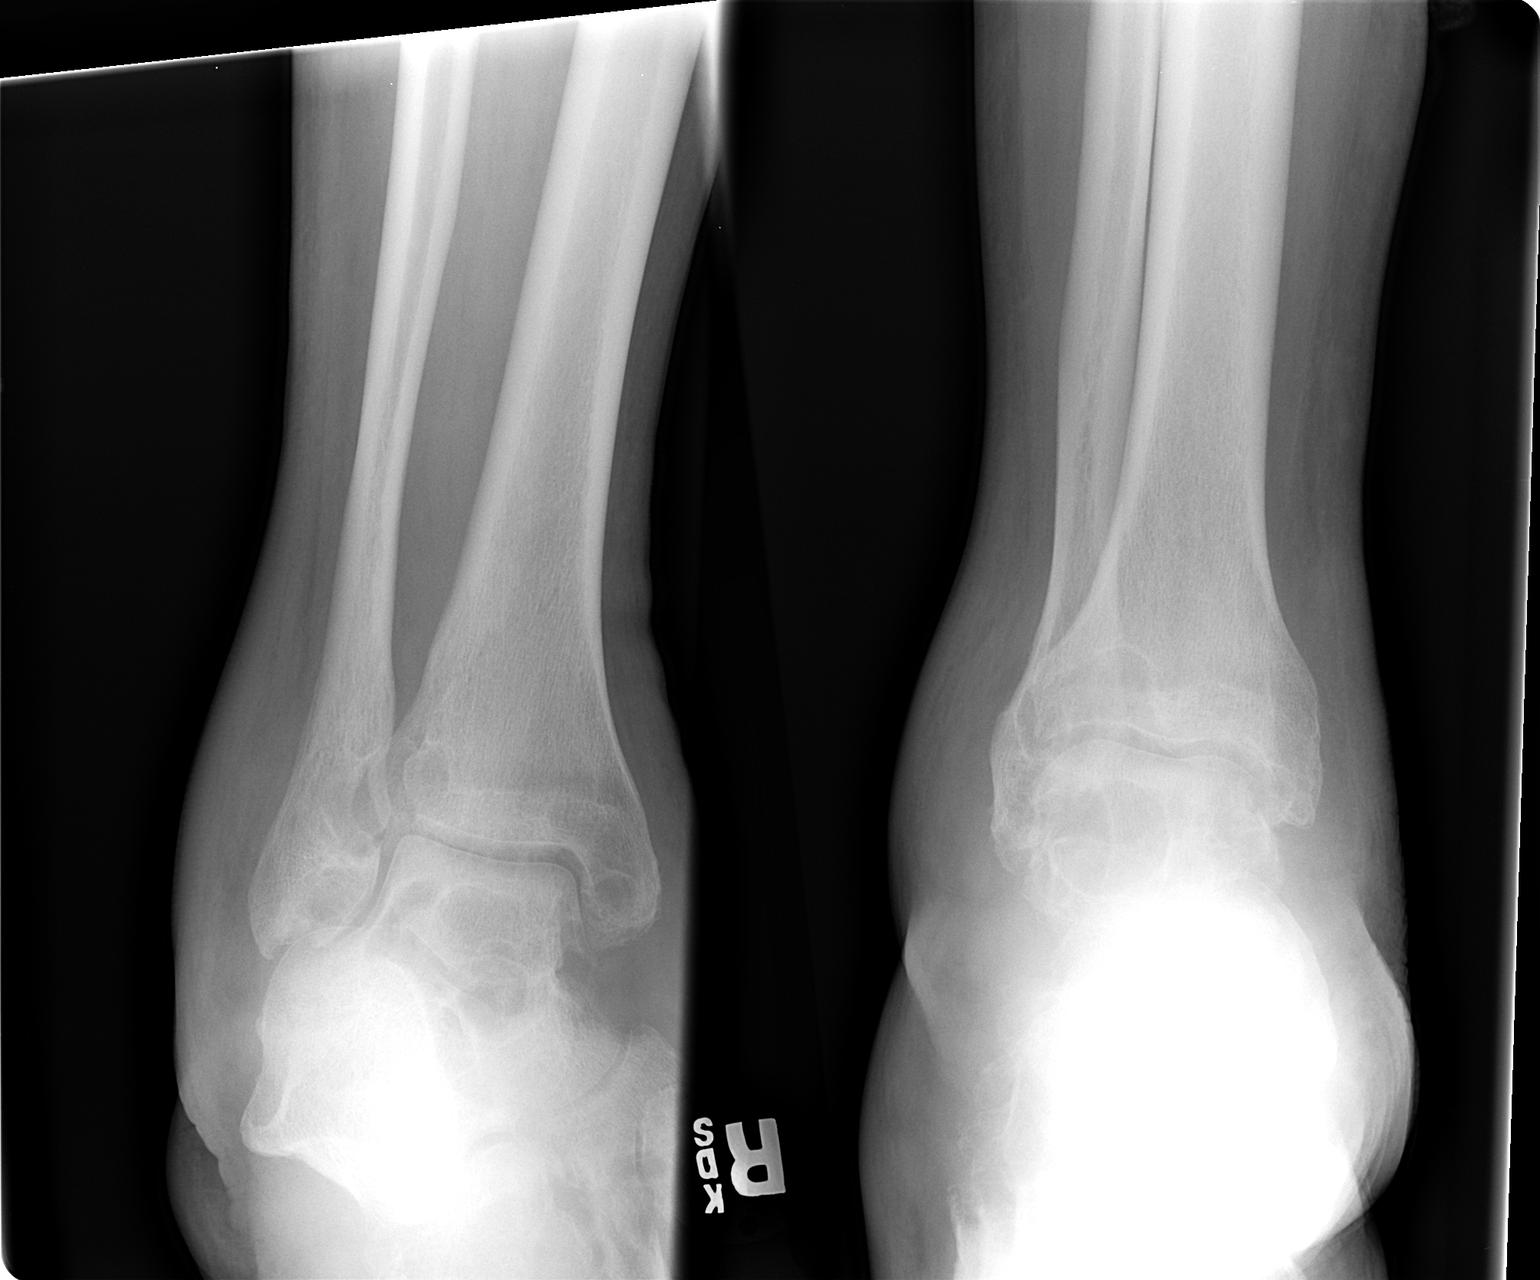

[view not recorded (2 of 2)]
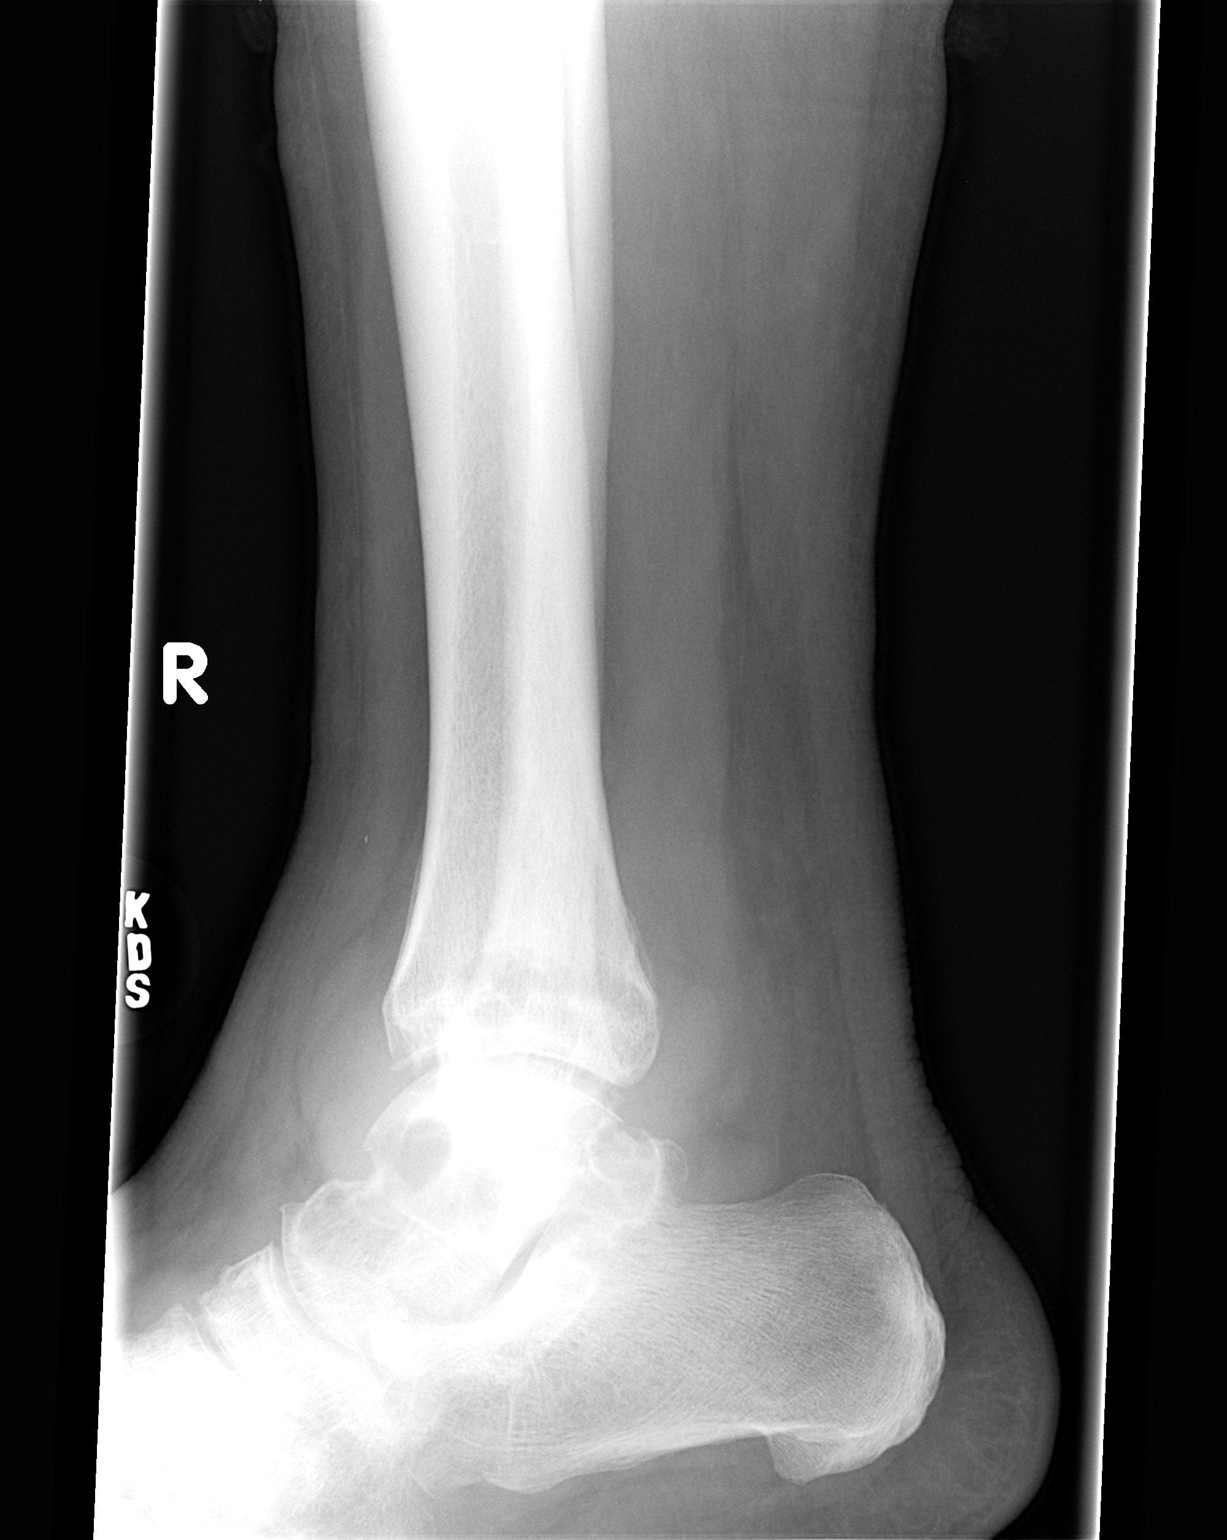

[2 of 2 positions shown; findings below may reference images not displayed]

FINDINGS: There are extensive bony erosions particularly severe at the bases of the 3rd and 4th metatarsals as well as involving the mid and lateral cuneiform.  There are also extensive erosions of the medial aspect of the navicular as well as at the 1st and 2nd metatarsal phalangeal joints.  The head of the 2nd metatarsal is flattened and there appears to be a small pathologic fracture of the head of the 2nd metatarsal.  Findings are consistent with severe gout.  There is a large ankle effusion with soft tissue swelling over the dorsum of the tarsal bones.  There is also marked swelling of the metatarsal phalangeal joints.
IMPRESSION: Severe gout of the foot.  Probable pathologic fracture of the head of the 2nd metatarsal due to the gout.  The bases of the 3rd, 4th, and 5th metatarsals are severely eroded. 
LEFT FOOT ? 3 VIEWS:
FINDINGS: Severe gout is seen at the 1st metatarsal phalangeal joint as well as in the tarsal metatarsal joints with erosions in the navicular, cuneiforms, and bases of the metatarsals.  There is marked soft tissue swelling with marked erosion of the dorsal aspect of the base of the proximal phalangeal bone of the great toe.
IMPRESSION: Severe gout of the left foot.  Numerous large erosions. 
LEFT ANKLE ? 3 VIEWS:
FINDINGS: There is a small ankle effusion without discreet erosions of the ankle joint.  There is some lucency in the medial aspect of the dome of the talus which may represent a subchondral cyst secondary to gout.
IMPRESSION: Joint effusion with probable subchondral cyst of the talus secondary to gout.  Severe gout is seen in the proximal foot as described on the foot x-ray report.
RIGHT ANKLE - 3 VIEWS:
FINDINGS: There is a large right effusion with extensive erosions of the talus, distal tibia, and distal fibula.
IMPRESSION: Severe gout of the right ankle with multiple large erosions and large effusion.
LEFT WRIST - 4 VIEWS:
FINDINGS: There is widening of the scapholunate joint space consistent with scapholunate dissociation.  There is suggestion of some subtle erosions in the dorsal aspect of the triquetrum and lunate.  There is also an erosion of the distal scaphoid seen on the lateral view.
IMPRESSION: Multiple erosions of the carpal bones with scapholunate dissociation.  Findings are consistent with gout.

## 2006-04-15 IMAGING — CR DG FOOT COMPLETE 3+V*R*
3 series · 3 of 3 positions shown · non-contrast
Comparison: none

CLINICAL DATA: Bilateral ankle and foot pain and left wrist pain and stiffness.  
RIGHT FOOT ? 3 VIEWS:

[view not recorded (1 of 3)]
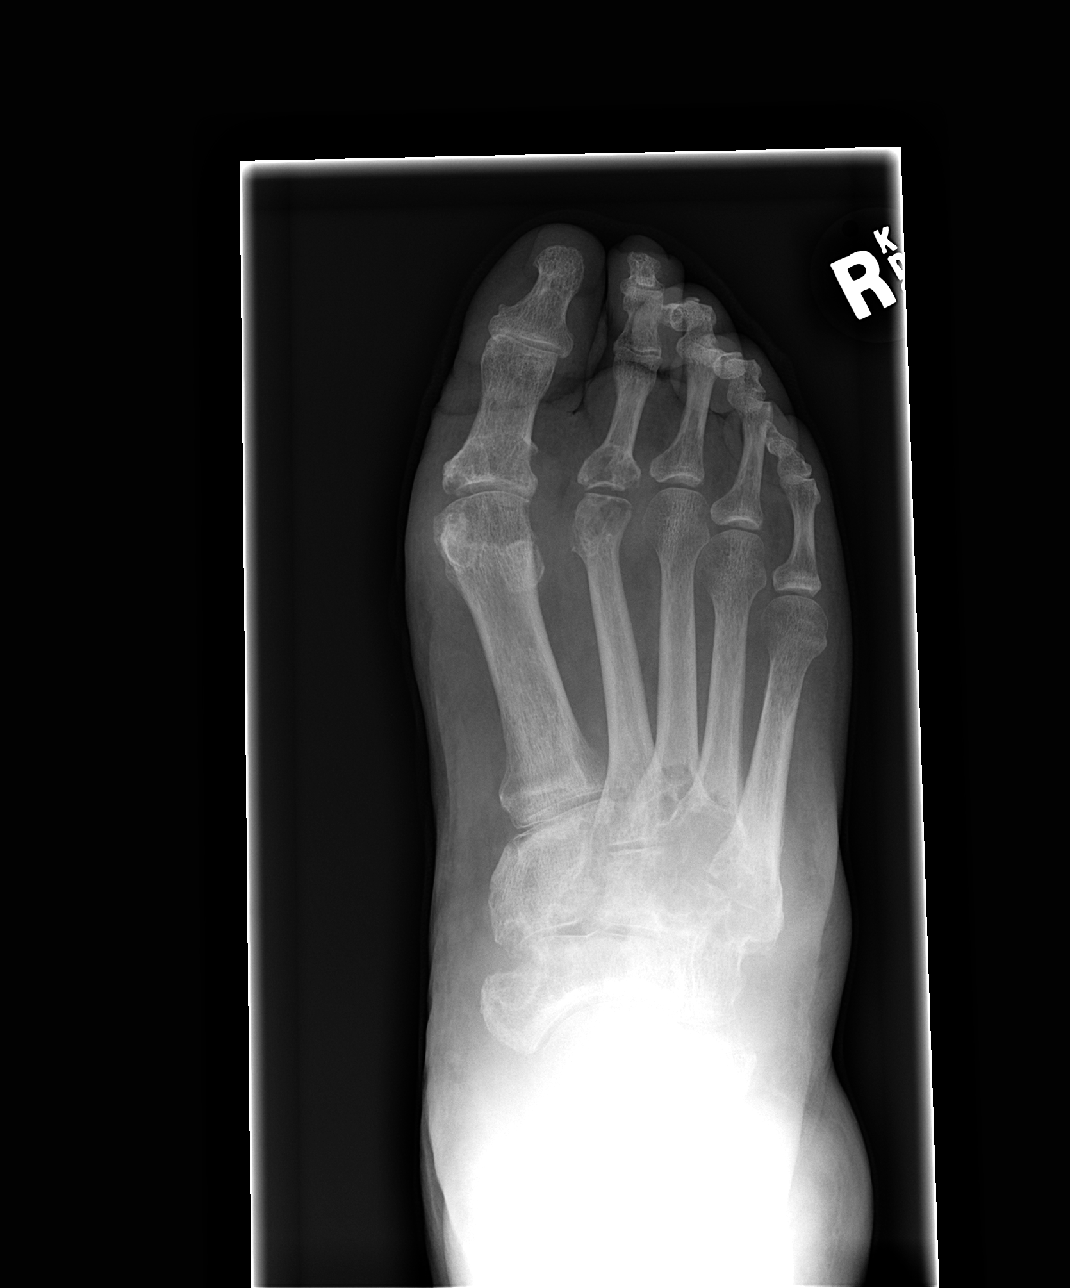

[view not recorded (2 of 3)]
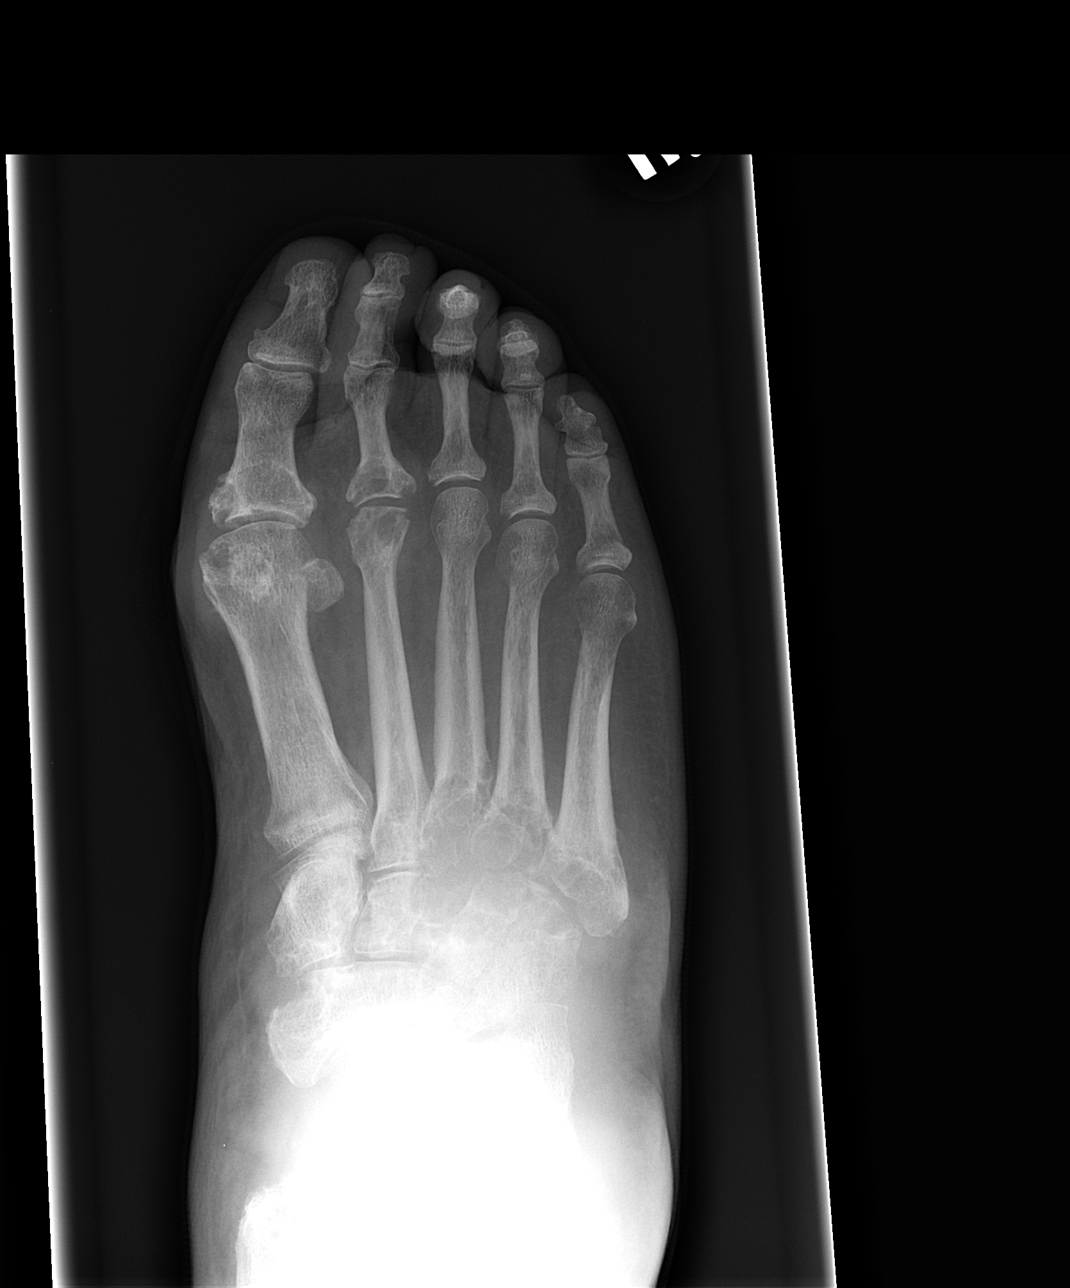

[view not recorded (3 of 3)]
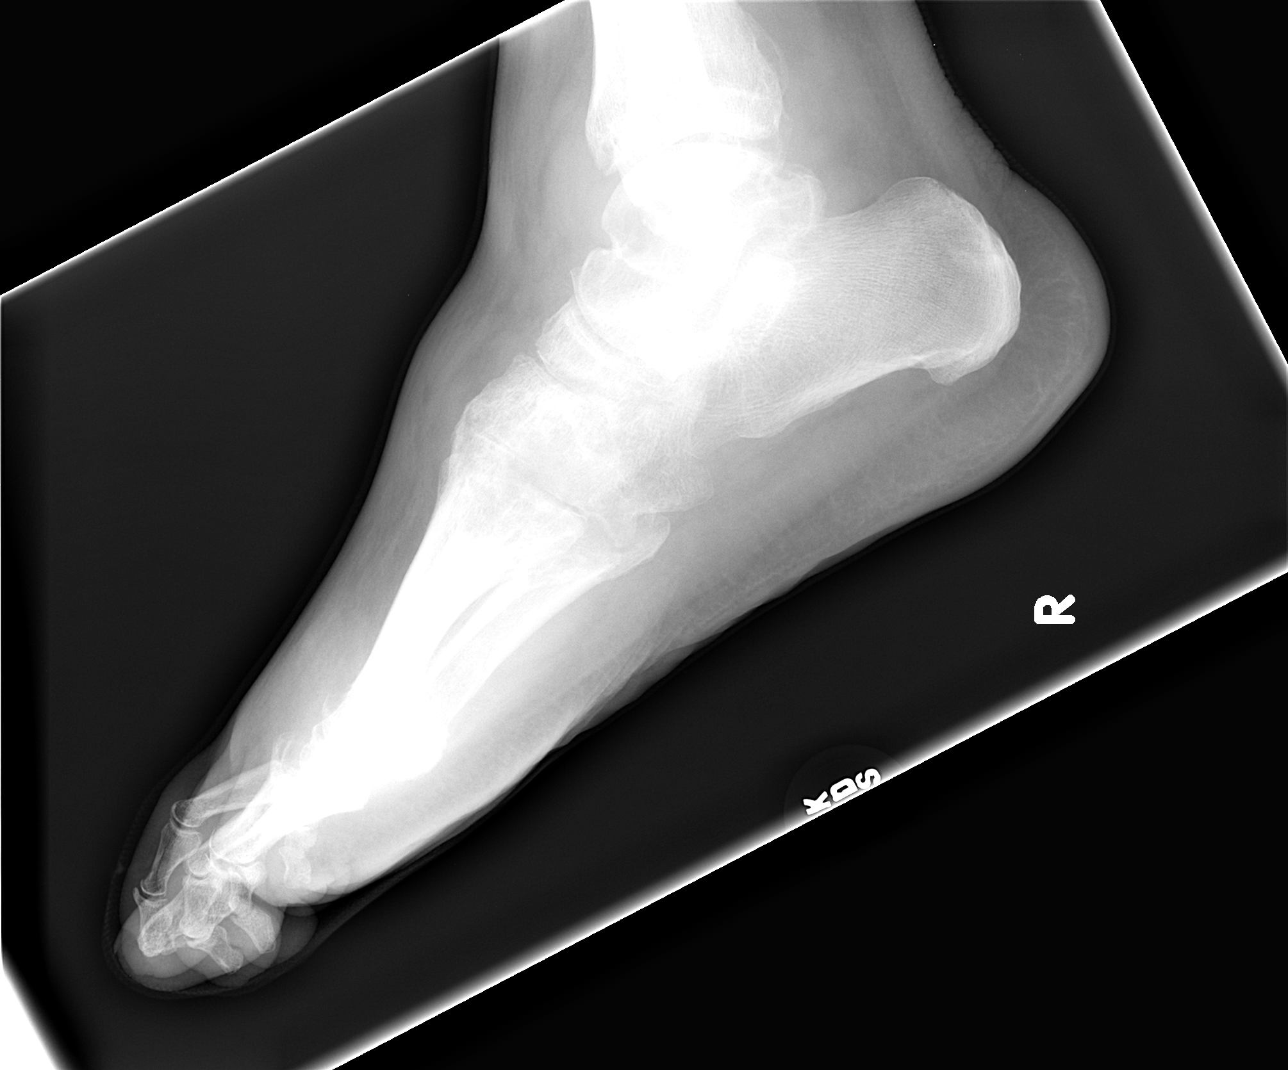

[3 of 3 positions shown; findings below may reference images not displayed]

FINDINGS: There are extensive bony erosions particularly severe at the bases of the 3rd and 4th metatarsals as well as involving the mid and lateral cuneiform.  There are also extensive erosions of the medial aspect of the navicular as well as at the 1st and 2nd metatarsal phalangeal joints.  The head of the 2nd metatarsal is flattened and there appears to be a small pathologic fracture of the head of the 2nd metatarsal.  Findings are consistent with severe gout.  There is a large ankle effusion with soft tissue swelling over the dorsum of the tarsal bones.  There is also marked swelling of the metatarsal phalangeal joints.
IMPRESSION: Severe gout of the foot.  Probable pathologic fracture of the head of the 2nd metatarsal due to the gout.  The bases of the 3rd, 4th, and 5th metatarsals are severely eroded. 
LEFT FOOT ? 3 VIEWS:
FINDINGS: Severe gout is seen at the 1st metatarsal phalangeal joint as well as in the tarsal metatarsal joints with erosions in the navicular, cuneiforms, and bases of the metatarsals.  There is marked soft tissue swelling with marked erosion of the dorsal aspect of the base of the proximal phalangeal bone of the great toe.
IMPRESSION: Severe gout of the left foot.  Numerous large erosions. 
LEFT ANKLE ? 3 VIEWS:
FINDINGS: There is a small ankle effusion without discreet erosions of the ankle joint.  There is some lucency in the medial aspect of the dome of the talus which may represent a subchondral cyst secondary to gout.
IMPRESSION: Joint effusion with probable subchondral cyst of the talus secondary to gout.  Severe gout is seen in the proximal foot as described on the foot x-ray report.
RIGHT ANKLE - 3 VIEWS:
FINDINGS: There is a large right effusion with extensive erosions of the talus, distal tibia, and distal fibula.
IMPRESSION: Severe gout of the right ankle with multiple large erosions and large effusion.
LEFT WRIST - 4 VIEWS:
FINDINGS: There is widening of the scapholunate joint space consistent with scapholunate dissociation.  There is suggestion of some subtle erosions in the dorsal aspect of the triquetrum and lunate.  There is also an erosion of the distal scaphoid seen on the lateral view.
IMPRESSION: Multiple erosions of the carpal bones with scapholunate dissociation.  Findings are consistent with gout.

## 2006-08-16 ENCOUNTER — Ambulatory Visit (HOSPITAL_COMMUNITY): Admission: RE | Admit: 2006-08-16 | Discharge: 2006-08-16 | Payer: Self-pay | Admitting: Pediatrics

## 2006-08-16 ENCOUNTER — Encounter (INDEPENDENT_AMBULATORY_CARE_PROVIDER_SITE_OTHER): Payer: Self-pay | Admitting: Specialist

## 2006-09-13 IMAGING — CR DG CHEST 2V
2 series · 2 of 2 positions shown · non-contrast
Comparison: None.

CLINICAL DATA: Chronic chest congestion for two weeks.  Cough.
 CHEST - 2 VIEW:

[view not recorded (1 of 2)]
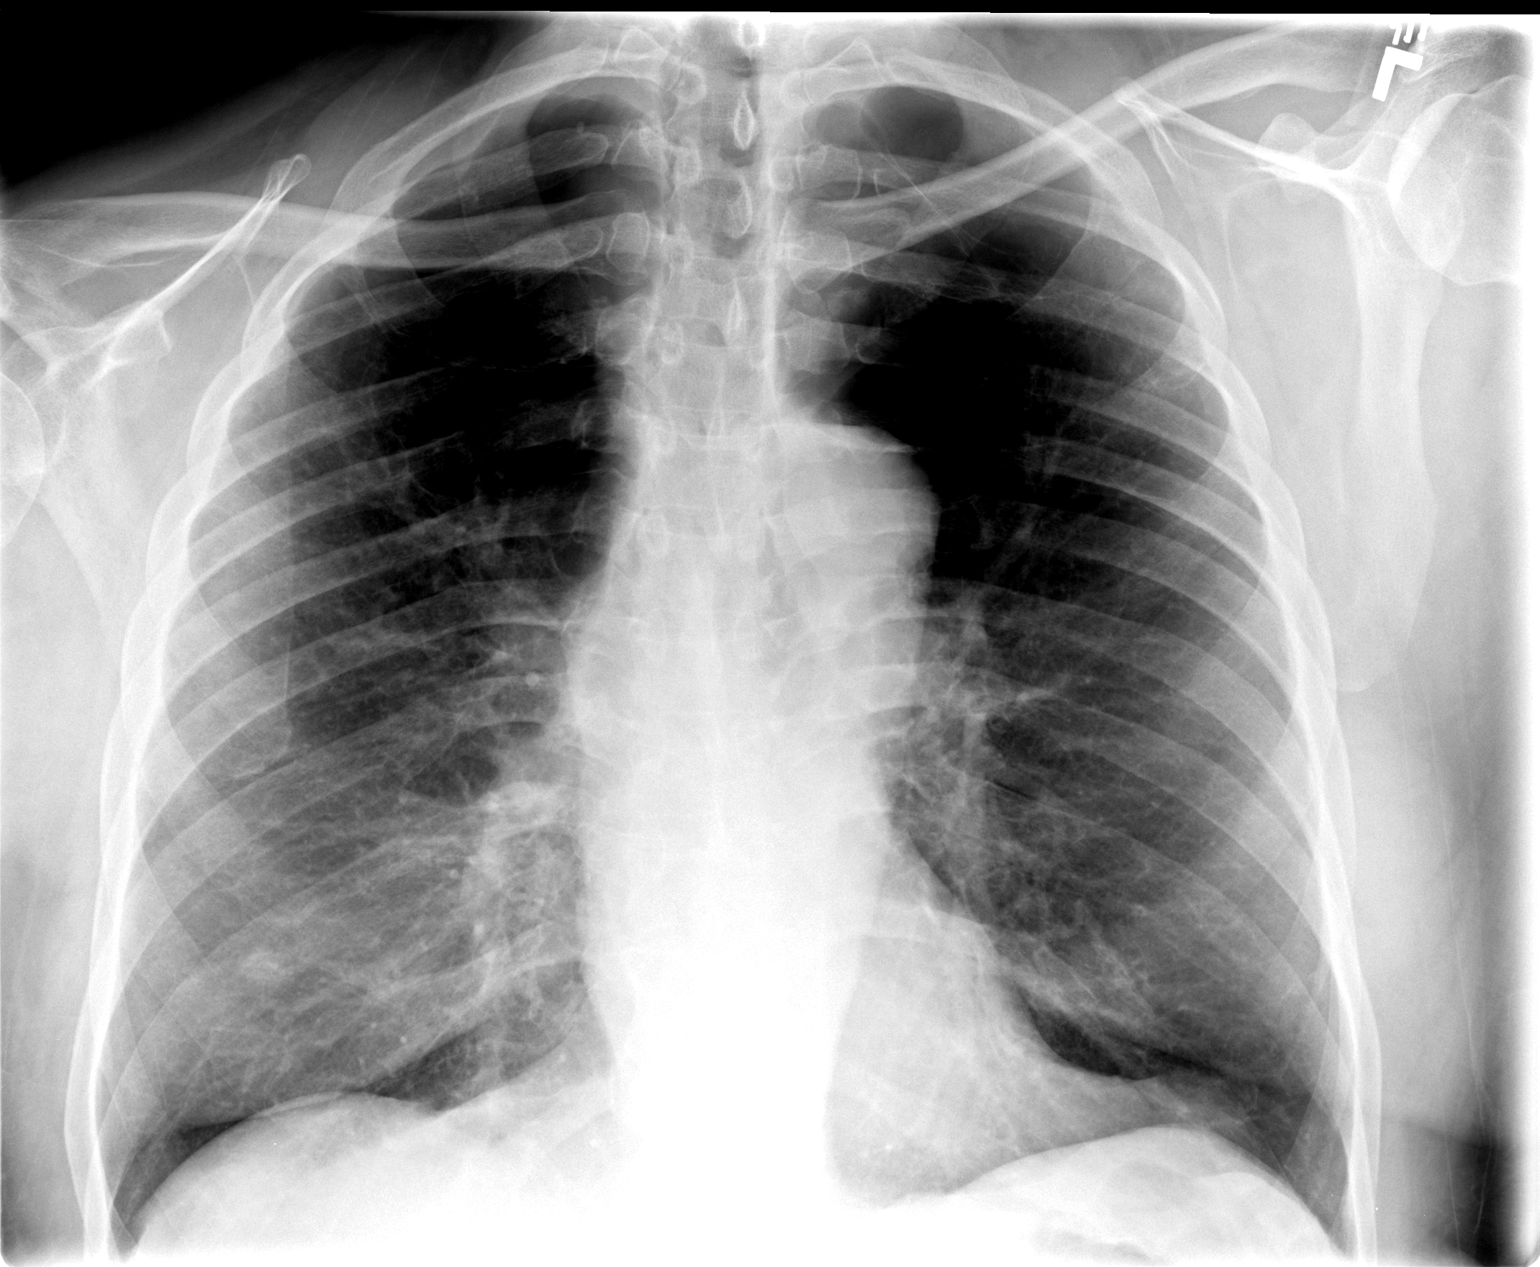

[view not recorded (2 of 2)]
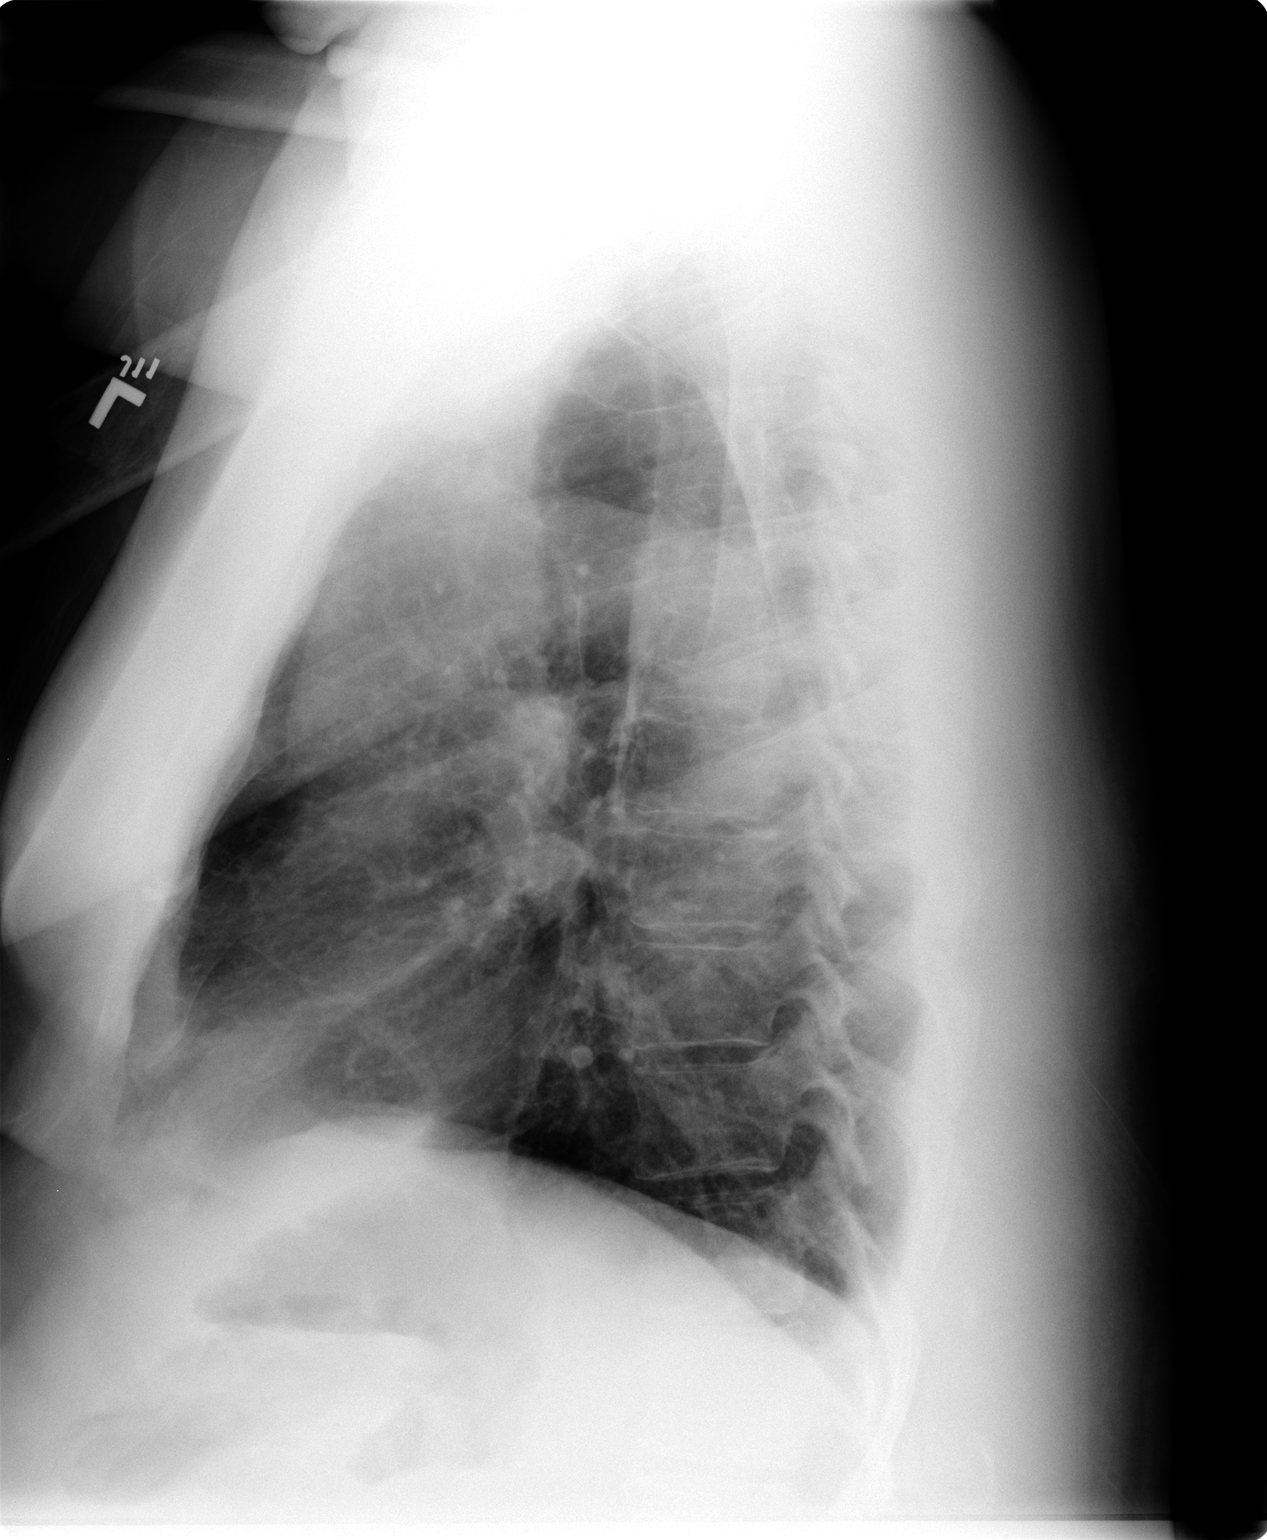

[2 of 2 positions shown; findings below may reference images not displayed]

FINDINGS: The heart size is normal.  The thoracic aorta is unfolded.  The patient has emphysema with marked bullous disease at the apices.  There is crowding of the lung markings at the lung bases.  On the frontal view, there is a question of a 12 mm nodule.  This could be a nipple shadow but a lung nodule is not excluded.  Are old films available for comparison?  If not, I would consider a chest CT.  Bony structures are unremarkable.  No effusions.
IMPRESSION: 1.  Severe chronic lung disease with emphysematous change at the apices.  
 2.  Question 12 mm nodule in the right lower chest.  If old films are not available for comparison at another location, I would consider a limited chest CT.

## 2006-09-24 IMAGING — CT CT CHEST W/ CM
1 of 2 series · 15 of 32 positions shown, 19 images · IV contrast (omnipaque)
Comparison: none

CLINICAL DATA: Follow-up pulmonary nodule seen on chest radiograph.  Patient is a former smoker.    
 CHEST CT WITH CONTRAST:
TECHNIQUE: Multidetector CT imaging of the chest was performed following the standard protocol during bolus administration of intravenous contrast.
 Contrast:  100 cc Omnipaque 300

[Series 4805: — · axial · 0.78mm/px · z∈[+1466,+1850]mm · 15 of 89 slices shown, 19 images]
[im 6/89  mediastinal]
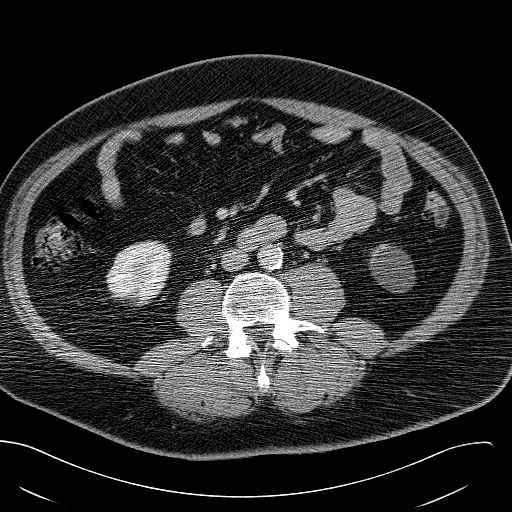
[im 6/89  lung]
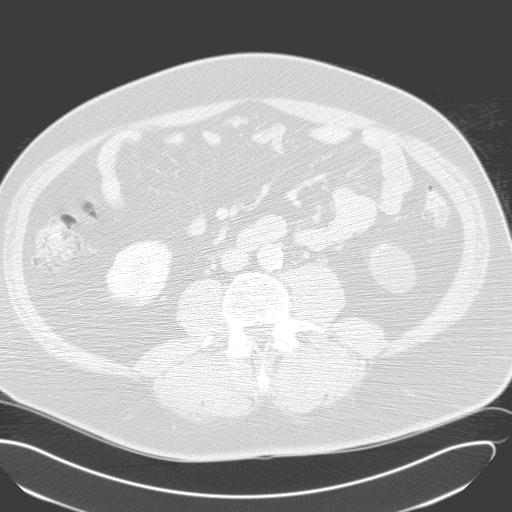
[im 12/89  lung]
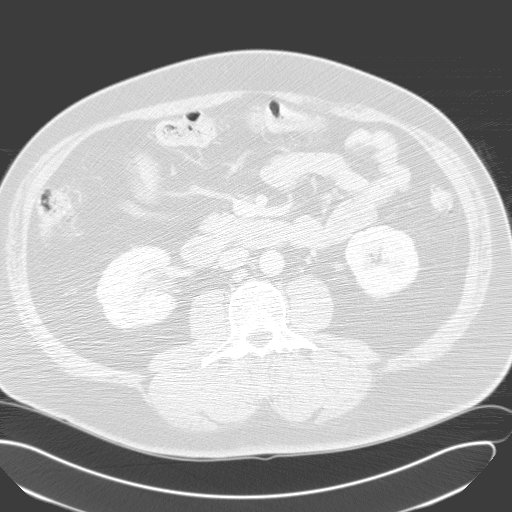
[im 18/89  lung]
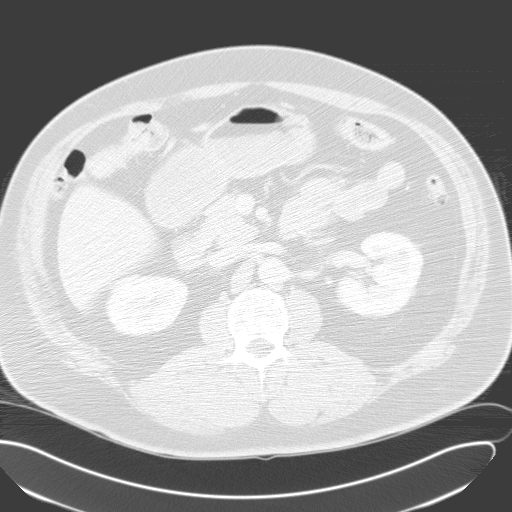
[im 24/89  lung]
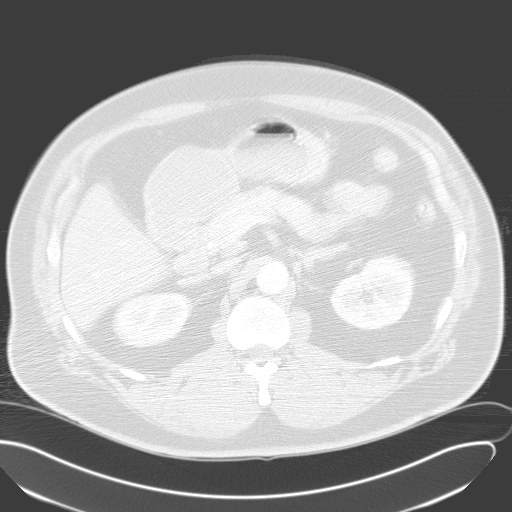
[im 30/89  mediastinal]
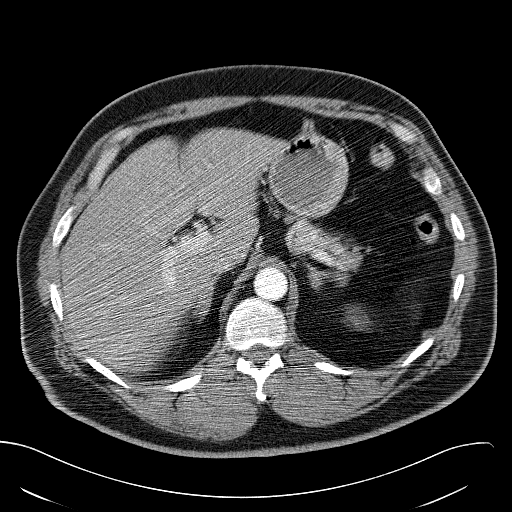
[im 30/89  lung]
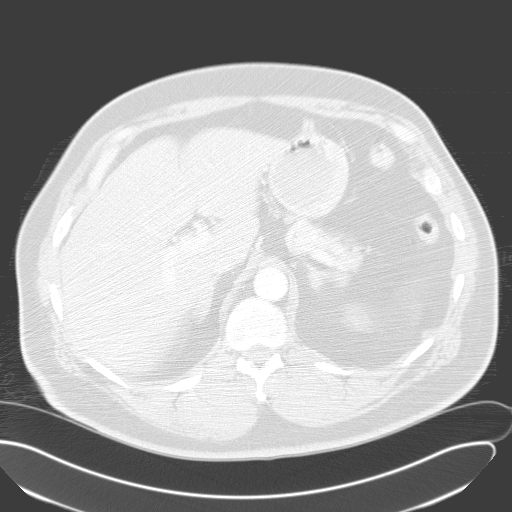
[im 36/89  lung]
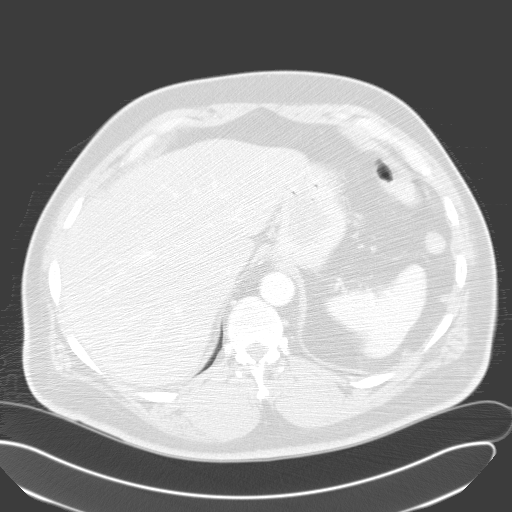
[im 42/89  lung]
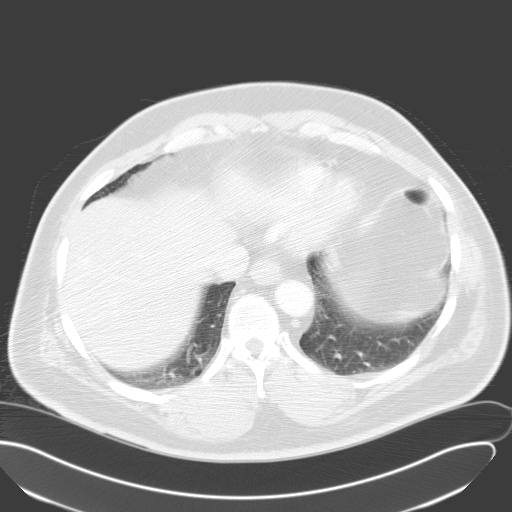
[im 45/89  lung]
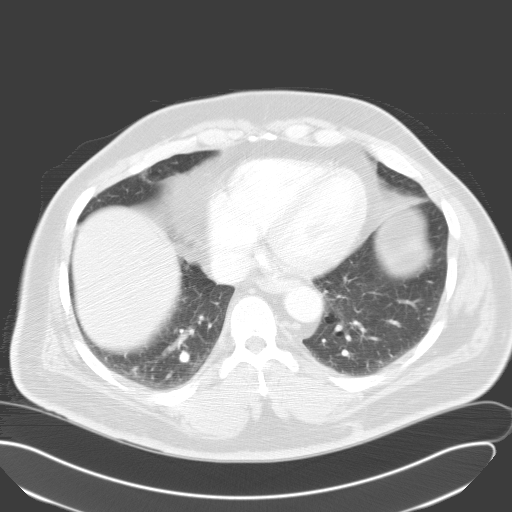
[im 47/89  mediastinal]
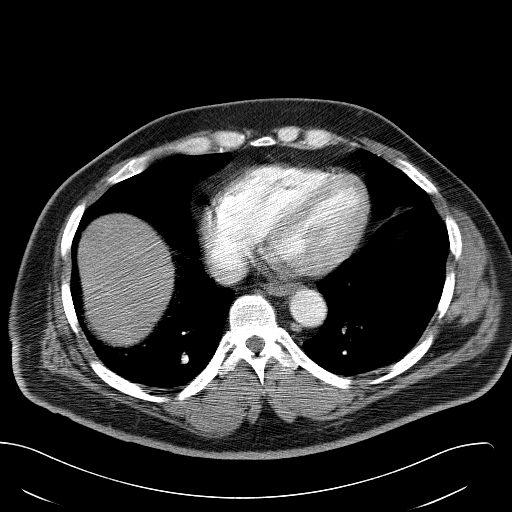
[im 47/89  lung]
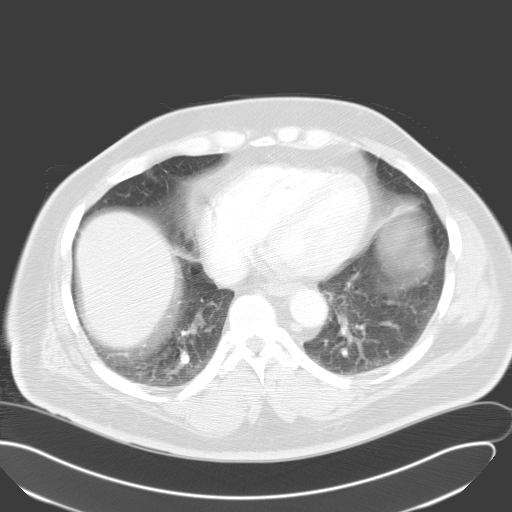
[im 53/89  lung]
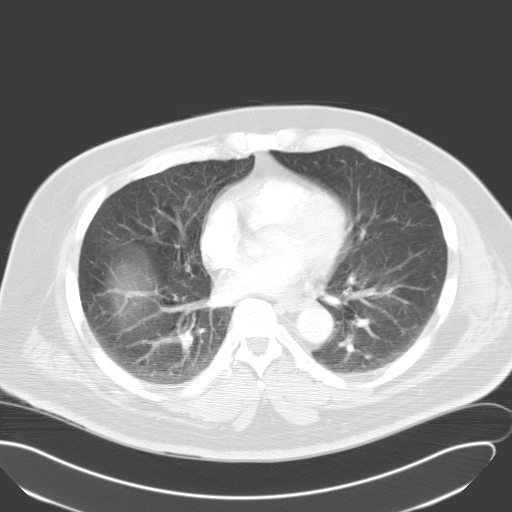
[im 59/89  lung]
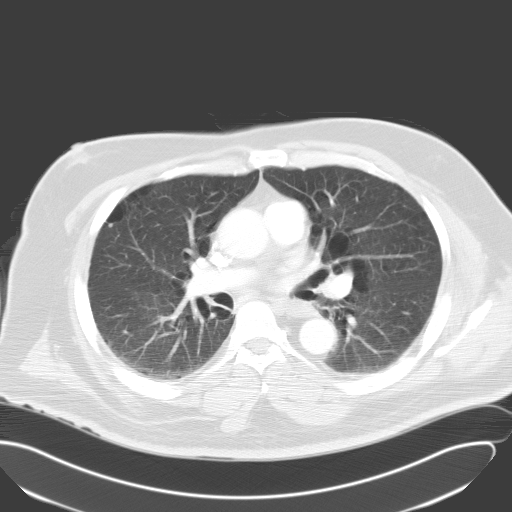
[im 65/89  lung]
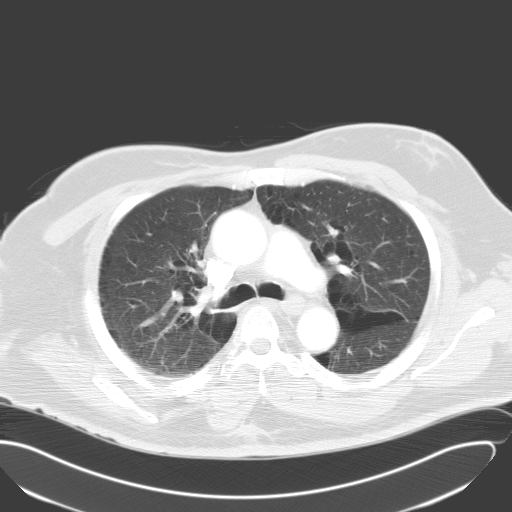
[im 71/89  mediastinal]
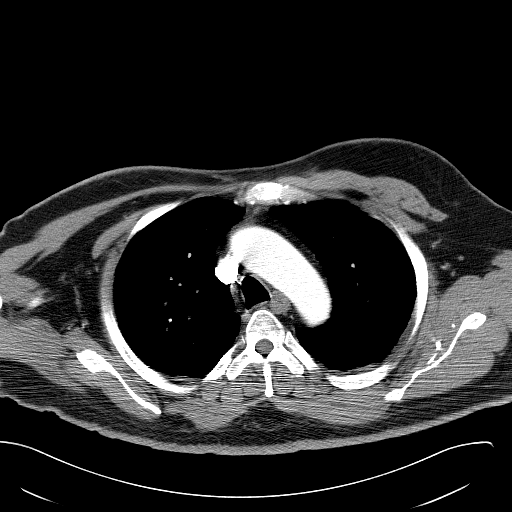
[im 71/89  lung]
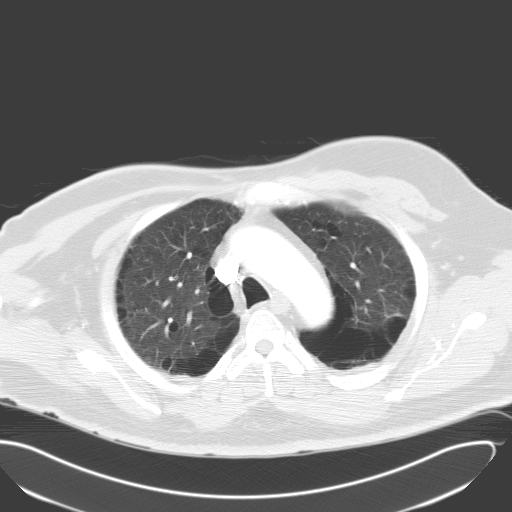
[im 77/89  lung]
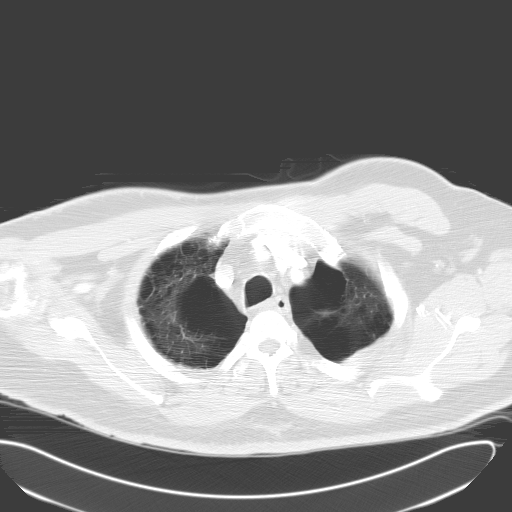
[im 83/89  lung]
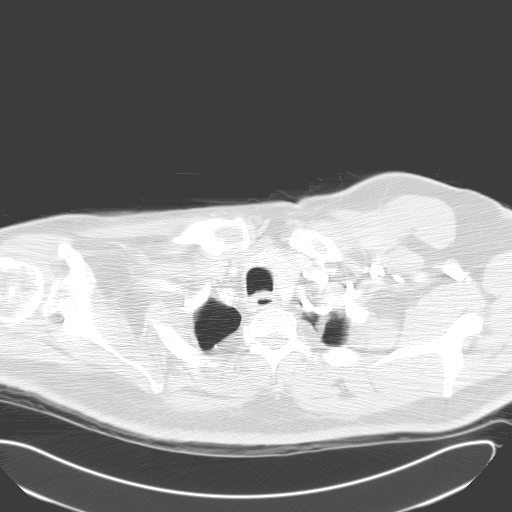

[15 of 32 positions shown; findings below may reference images not displayed]

FINDINGS: Thyroid gland enhances homogeneously.  No enlarged left axillary or right axillary lymphadenopathy.  There is no right paratracheal precarinal, subcarinal, AP window or hilar lymphadenopathy.  No pleural or pericardial effusion.  
 Within the right lobe of the liver there is a 14.1 X 11.7 mm hypervascular lesion.
 Within the left lower pole of the left kidney there is a fluid attenuating mass consistent with a simple cyst.  A low attenuation lesion arises from the lower pole of the right kidney which measures 27 Hounsfield units.  This does not qualify as a simple cyst.  
 There is bullous emphysematous changes affecting both lungs.  
 Scar versus atelectasis seen within the lingular segment of the left lung.  Within the right upper lobe there is a tiny nodule which measures 4.6 mm.  This is directly adjacent to a bulla.  This is best seen on image number 30.  The 12 mm nodule seen on chest radiograph is not identified.
IMPRESSION: 1.  12 mm nodule not seen on chest CT.  There is however a 4.6 mm right upper lobe nodule.  Given the patient?s history of smoking, a follow-up exam in six months is recommended.  
 2.  Hypervascular lesion within right lobe of liver.  This likely represents a liver hemangioma.  Hypervascular metastasis or other hypervascular primary liver tumors are not excluded.  I would recommend a dedicated MRI of the liver for further evaluation and confirm that this is indeed a hemangioma and exclude malignancy.

## 2006-11-26 IMAGING — CT CT ABDOMEN W/ CM
1 of 3 series · 14 of 32 positions shown, 19 images · IV contrast (CONTRAST)
Comparison: 10/12/2004

ABDOMEN CT WITH CONTRAST

CLINICAL DATA: Fall, striking right side of chest and abdomen, right-sided pain
TECHNIQUE: Multidetector CT imaging of the abdomen and pelvis was performed
following the standard protocol during bolus administration of intravenous
contrast.

Contrast:  150 cc Omnipaque 300

[Series 730: — · axial · 0.98mm/px · z∈[+1430,+1930]mm · 14 of 114 slices shown, 19 images]
[im 7/114  soft-tissue]
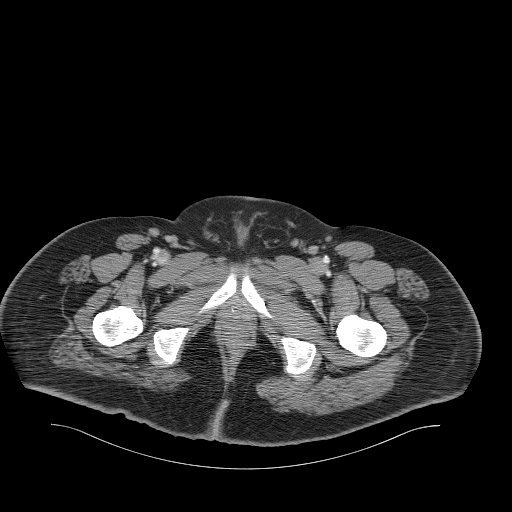
[im 7/114  bone]
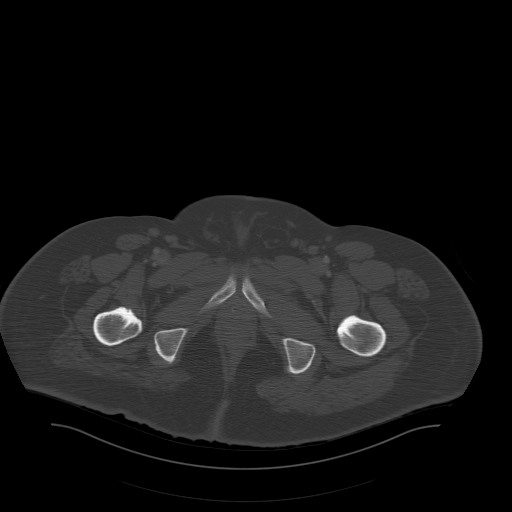
[im 14/114  soft-tissue]
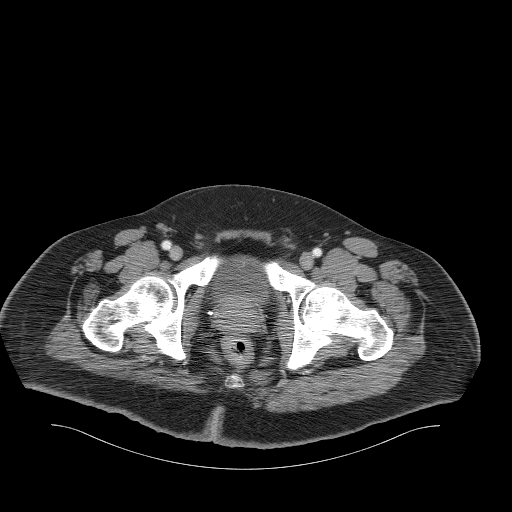
[im 27/114  soft-tissue]
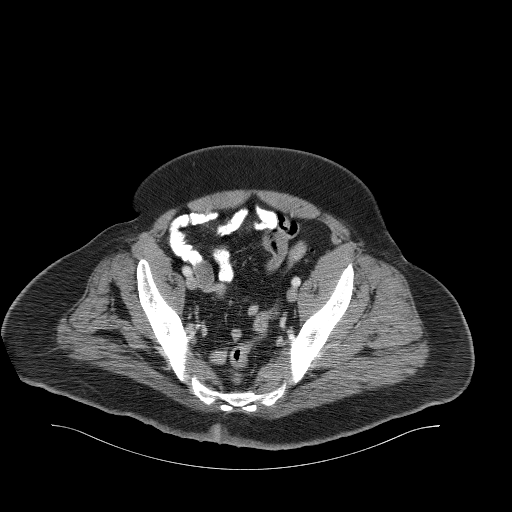
[im 34/114  soft-tissue]
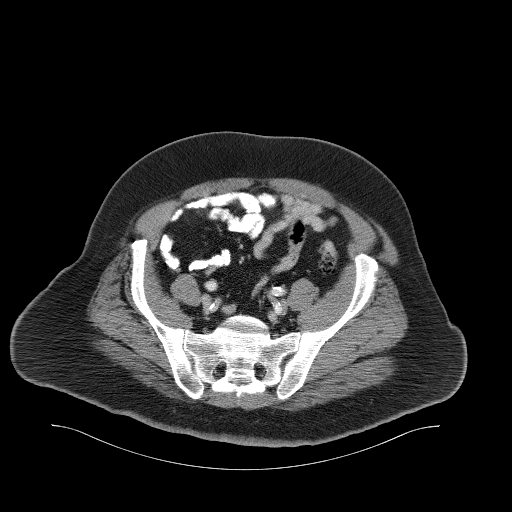
[im 40/114  soft-tissue]
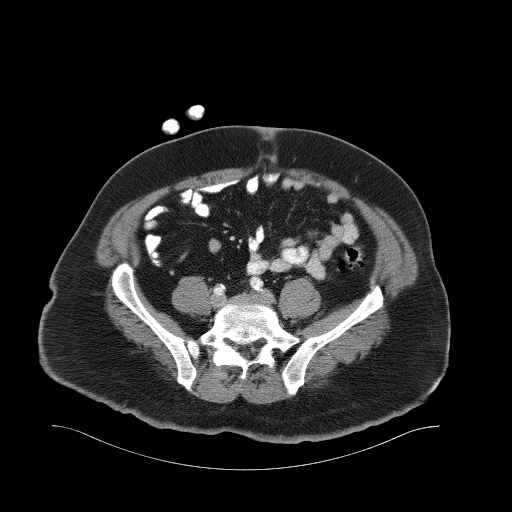
[im 47/114  soft-tissue]
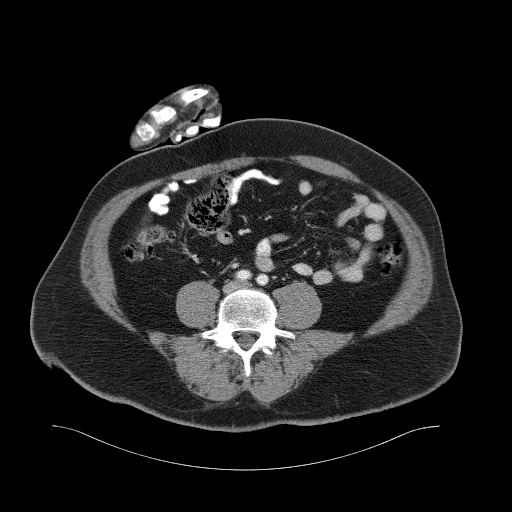
[im 60/114  soft-tissue]
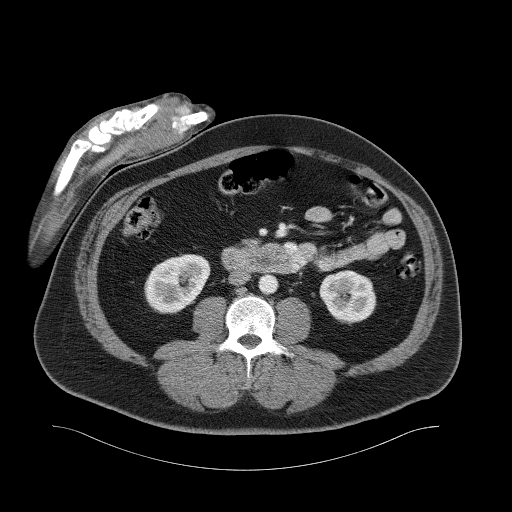
[im 67/114  soft-tissue]
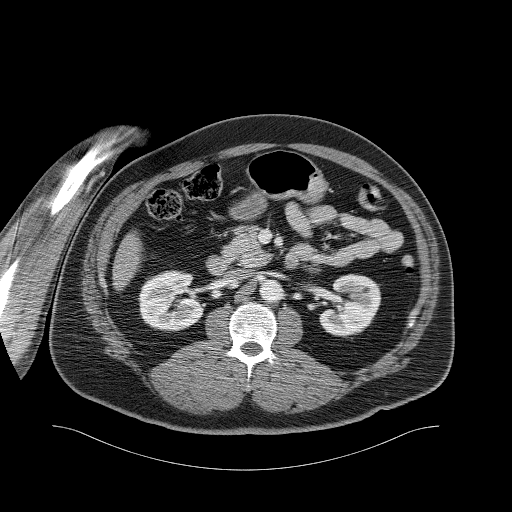
[im 74/114  soft-tissue]
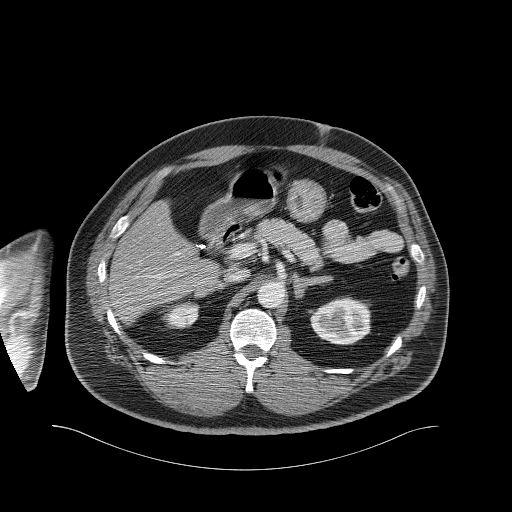
[im 74/114  bone]
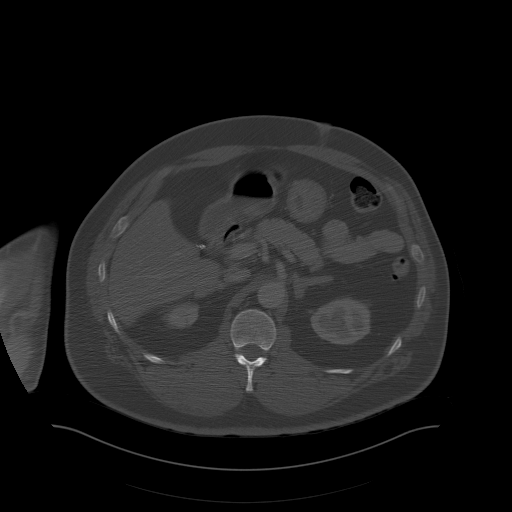
[im 80/114  soft-tissue]
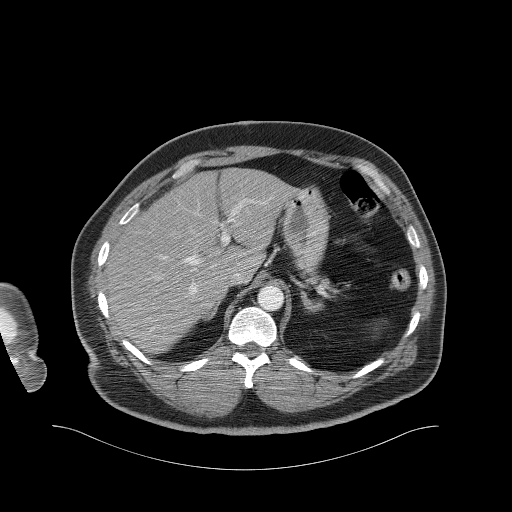
[im 87/114  soft-tissue]
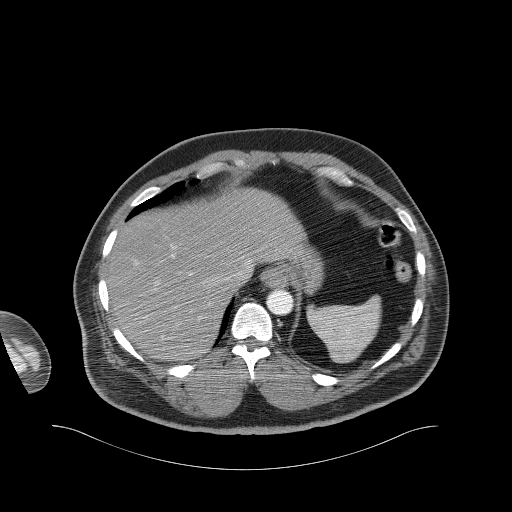
[im 87/114  lung]
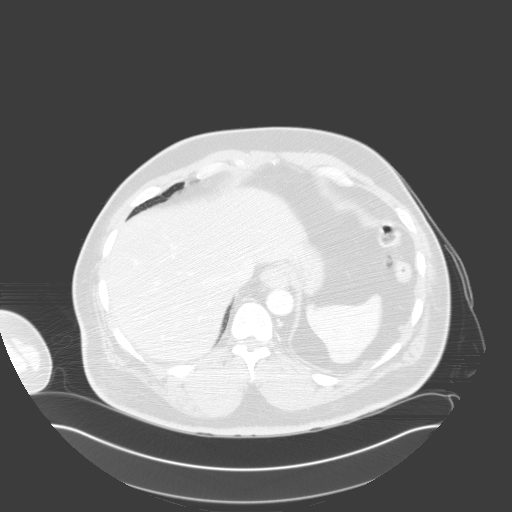
[im 94/114  lung]
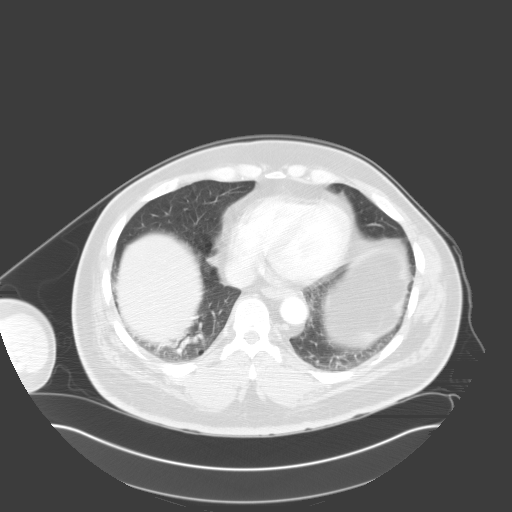
[im 100/114  soft-tissue]
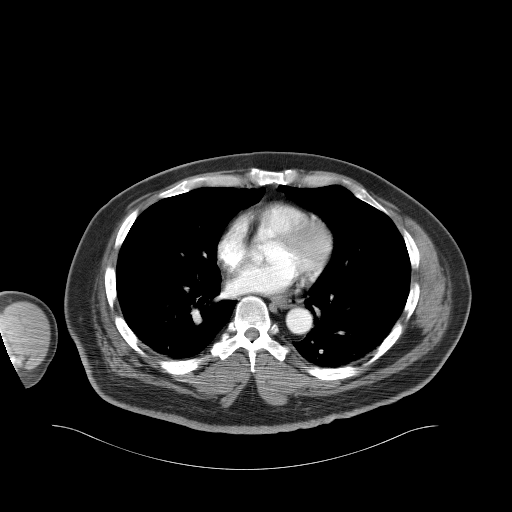
[im 100/114  lung]
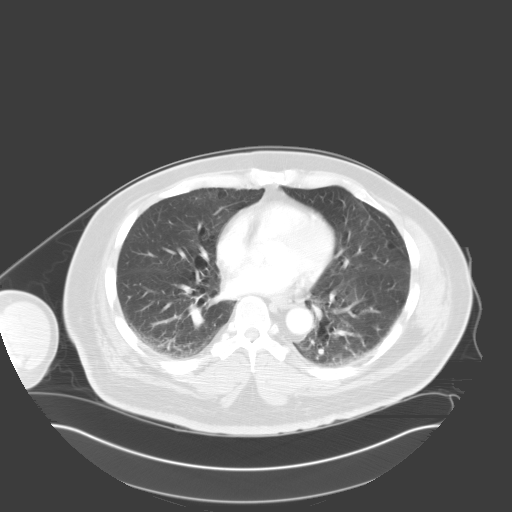
[im 107/114  soft-tissue]
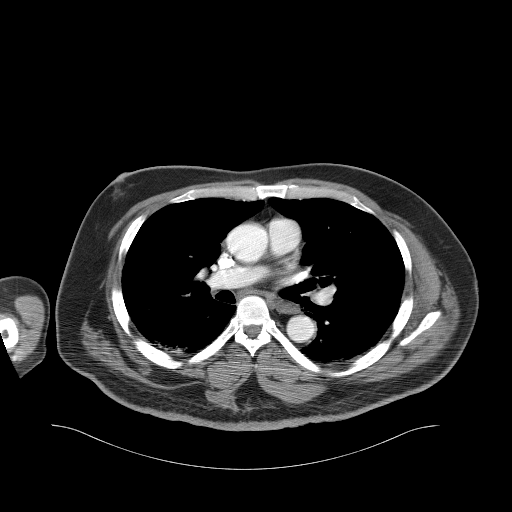
[im 107/114  lung]
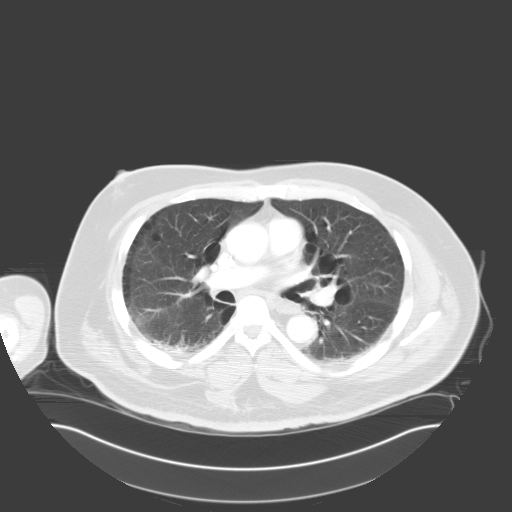

[14 of 32 positions shown; findings below may reference images not displayed]

FINDINGS: Again noted is an 11 mm hypervascular lesion in the dome of the
liver. This is unchanged since prior study. Small cysts noted within the left
lobe of the liver, stable. Patient is status post cholecystectomy. No evidence
of solid organ injury. Extensive colonic diverticular disease without evidence
of active diverticulitis. Benign appearing renal cysts present. There severe
emphysematous changes in the lungs. Bibasilar atelectasis. Bony structures
unremarkable.

IMPRESSION

Stable 11 mm hypervascular lesion within the liver.

Severe COPD. 

No acute findings.

Diverticulosis

PELVIS CT WITH CONTRAST
FINDINGS: There is diverticular disease without evidence of active
diverticulitis. No free fluid, free air, or adenopathy. Visualized skeleton
unremarkable.

IMPRESSION

No acute findings. Diverticulosis.

## 2007-03-28 ENCOUNTER — Ambulatory Visit: Payer: Self-pay | Admitting: Internal Medicine

## 2007-03-30 ENCOUNTER — Ambulatory Visit (HOSPITAL_COMMUNITY): Admission: RE | Admit: 2007-03-30 | Discharge: 2007-03-30 | Payer: Self-pay | Admitting: Internal Medicine

## 2007-04-30 ENCOUNTER — Ambulatory Visit: Payer: Self-pay | Admitting: Internal Medicine

## 2007-04-30 ENCOUNTER — Ambulatory Visit (HOSPITAL_COMMUNITY): Admission: RE | Admit: 2007-04-30 | Discharge: 2007-04-30 | Payer: Self-pay | Admitting: Internal Medicine

## 2007-04-30 ENCOUNTER — Encounter: Payer: Self-pay | Admitting: Internal Medicine

## 2007-05-02 ENCOUNTER — Ambulatory Visit (HOSPITAL_COMMUNITY): Admission: RE | Admit: 2007-05-02 | Discharge: 2007-05-02 | Payer: Self-pay | Admitting: Internal Medicine

## 2007-05-17 ENCOUNTER — Emergency Department (HOSPITAL_COMMUNITY): Admission: EM | Admit: 2007-05-17 | Discharge: 2007-05-17 | Payer: Self-pay | Admitting: Emergency Medicine

## 2007-05-21 ENCOUNTER — Ambulatory Visit: Payer: Self-pay | Admitting: Internal Medicine

## 2007-10-11 ENCOUNTER — Ambulatory Visit: Payer: Self-pay | Admitting: Internal Medicine

## 2007-10-16 ENCOUNTER — Ambulatory Visit (HOSPITAL_COMMUNITY): Admission: RE | Admit: 2007-10-16 | Discharge: 2007-10-16 | Payer: Self-pay | Admitting: Internal Medicine

## 2007-11-14 IMAGING — US US ABDOMEN COMPLETE
1 series · 14 of 25 positions shown · non-contrast
Comparison: none

HISTORY: Transaminitis, elevated LFTs, upper abdominal pain

ULTRASOUND ABDOMEN COMPLETE:
TECHNIQUE: Complete abdominal ultrasound examination was performed including
evaluation of the liver, gallbladder, bile ducts, pancreas, kidneys, spleen,
IVC, and abdominal aorta.

[Series 1: unknown · 0.37mm/px · 14 of 50 slices shown]
[im 1/50]
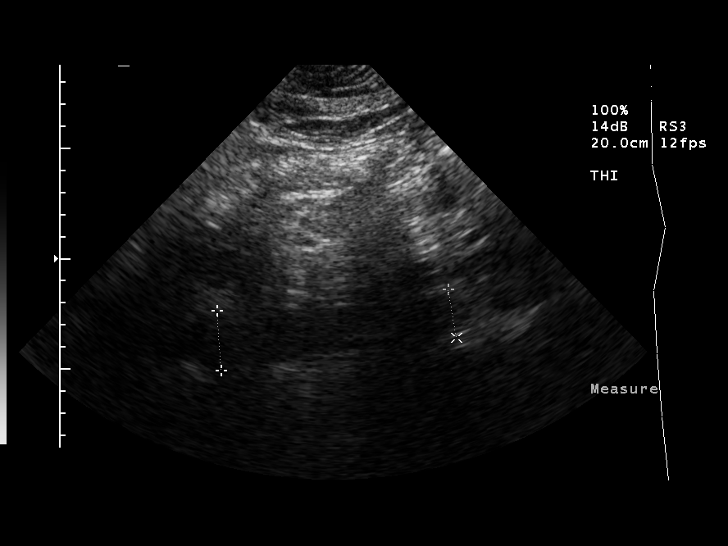
[im 5/50]
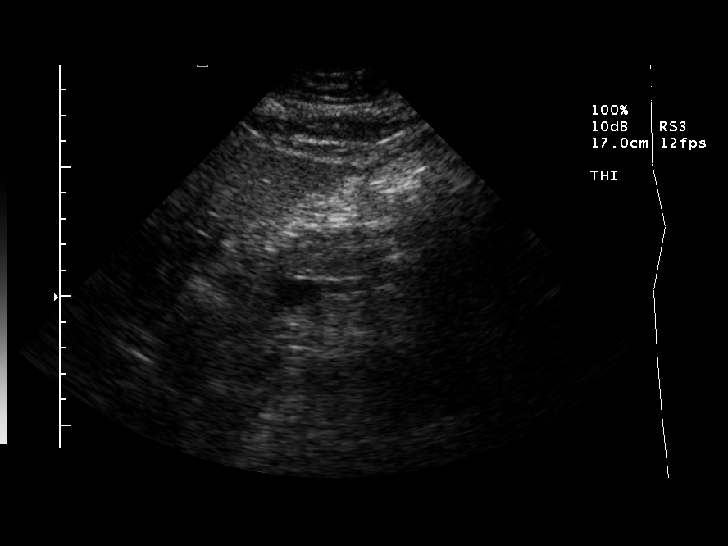
[im 9/50]
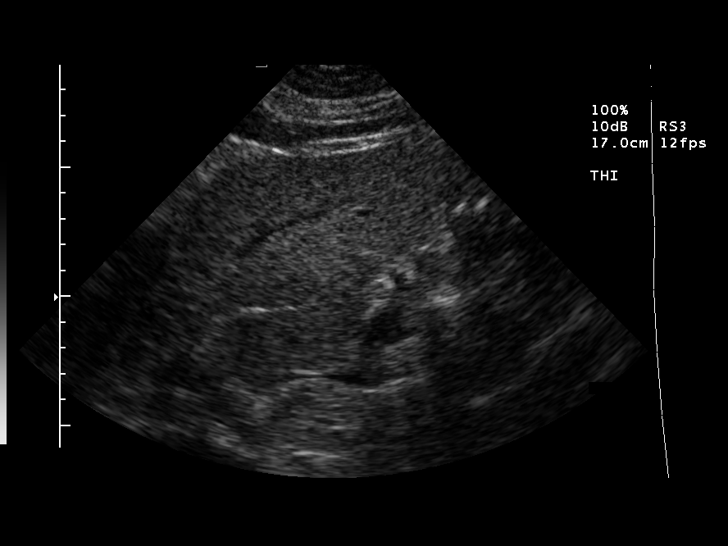
[im 13/50]
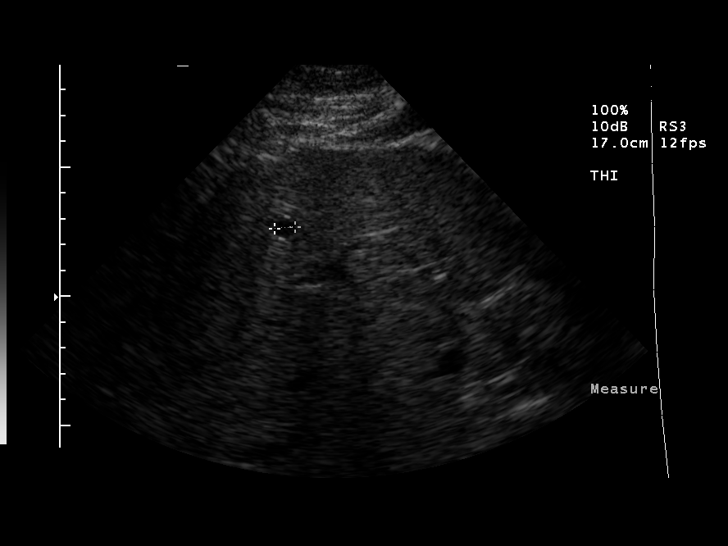
[im 17/50]
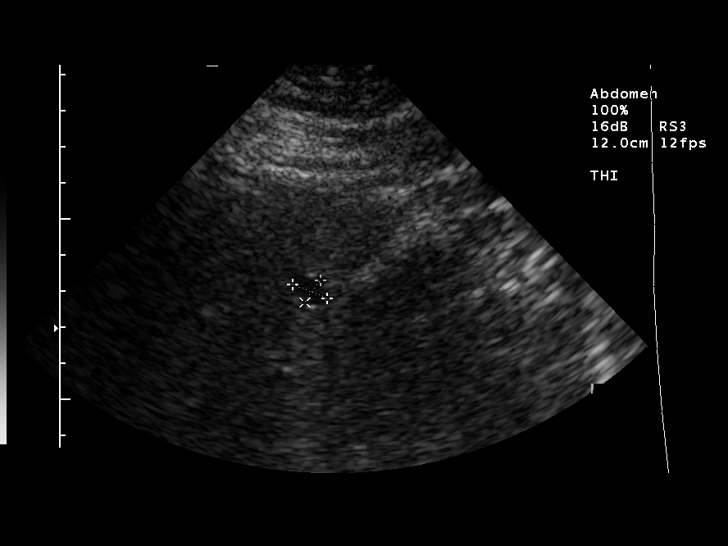
[im 19/50]
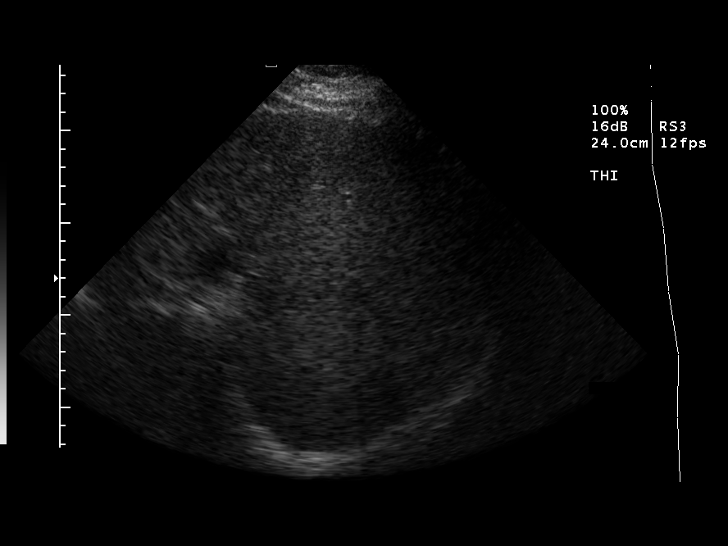
[im 23/50]
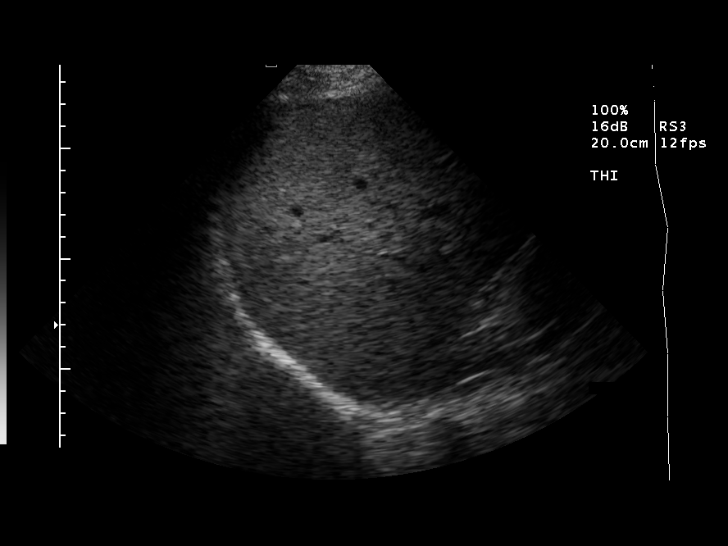
[im 27/50]
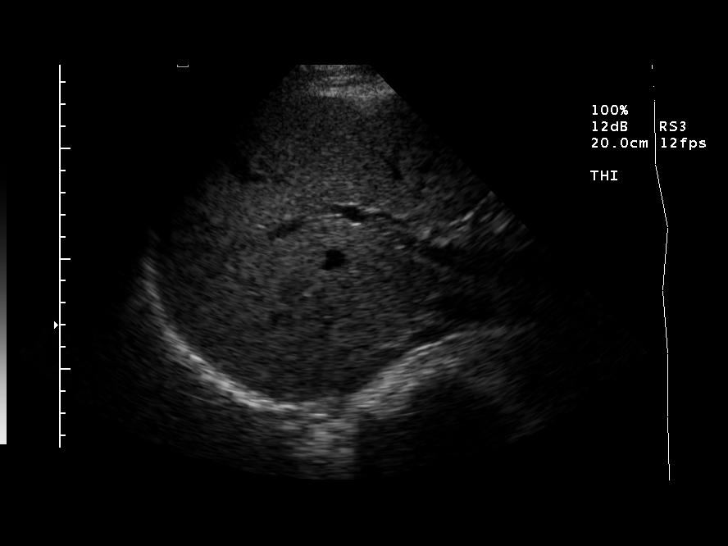
[im 31/50]
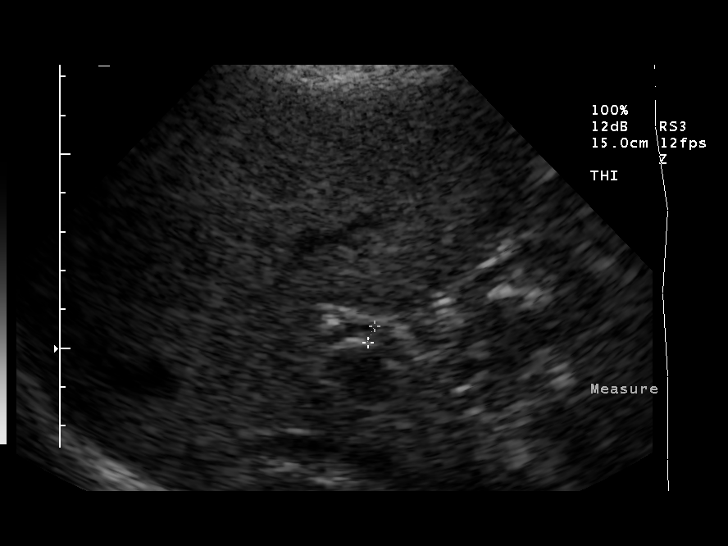
[im 33/50]
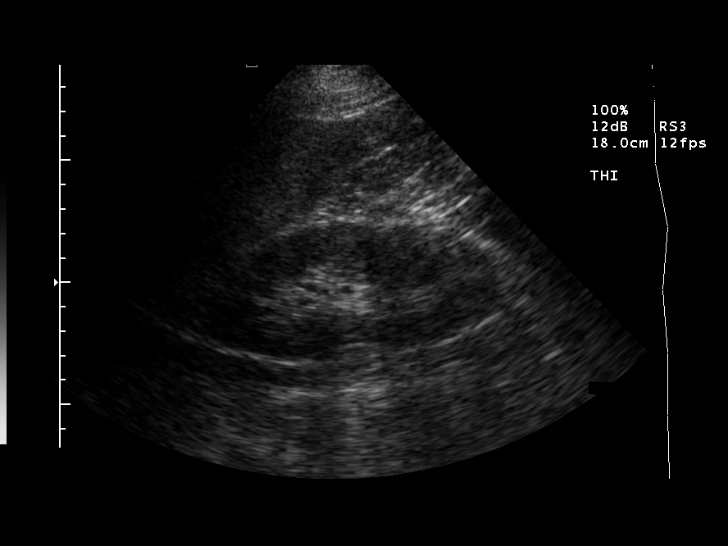
[im 37/50]
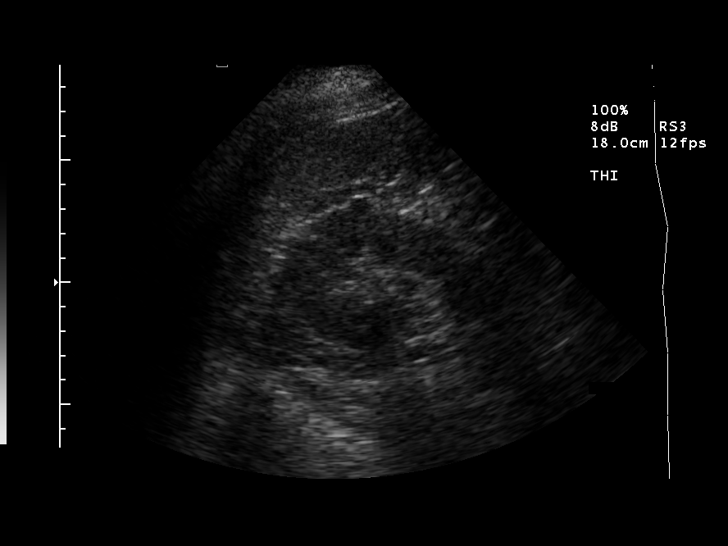
[im 41/50]
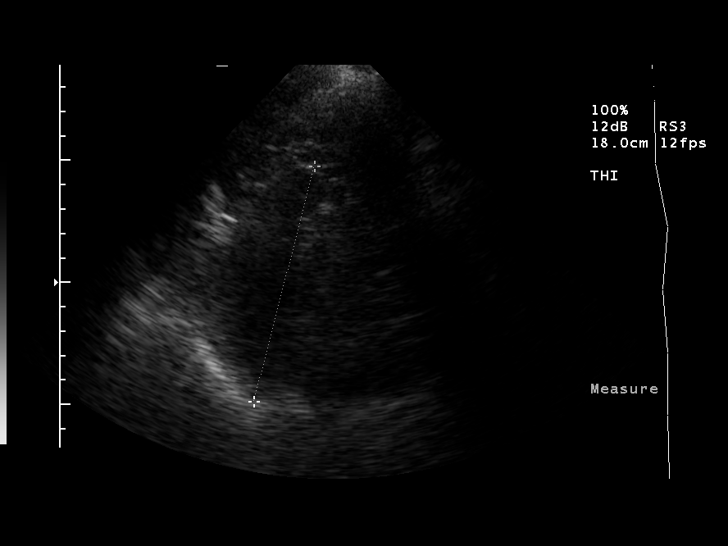
[im 45/50]
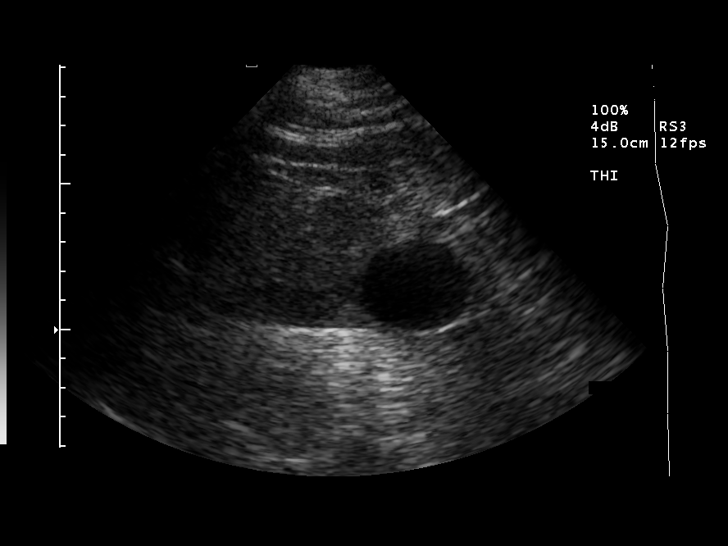
[im 50/50]
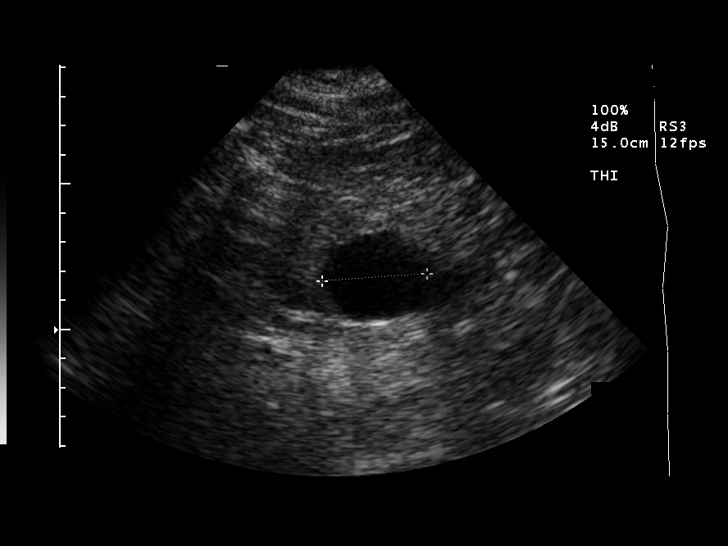

[14 of 25 positions shown; findings below may reference images not displayed]

Gallbladder surgically absent.
Common bile duct normal caliber 5 mm diameter.
Echogenic coarsened appearance of liver with smooth margins, likely fatty
infiltration though this can be seen with cirrhosis and certain infiltrative
disorders.
Tiny cyst left lobe liver, 10 by 8 x 8mm.
Remainder of liver, pancreas, and spleen normal appearance, spleen measuring
cm length.
Kidneys normal size at 12.6 cm length right and 13.7 cm length left.
Small cyst inferior pole left kidney, 3.7 x 3.3 x 3.6 cm.
No evidence of hydronephrosis.
Aorta and IVC normal.
No free fluid.
IMPRESSION: Probable fatty infiltration.
Minimal cystic disease of left kidney and liver.
No acute abnormalities.

## 2007-12-10 ENCOUNTER — Emergency Department (HOSPITAL_COMMUNITY): Admission: EM | Admit: 2007-12-10 | Discharge: 2007-12-11 | Payer: Self-pay | Admitting: Specialist

## 2007-12-11 ENCOUNTER — Emergency Department (HOSPITAL_COMMUNITY): Admission: EM | Admit: 2007-12-11 | Discharge: 2007-12-11 | Payer: Self-pay | Admitting: Emergency Medicine

## 2007-12-17 IMAGING — CT CT ABDOMEN W/ CM
2 of 5 series · 15 of 46 positions shown, 17 images · IV contrast (Omnipaque 300)
Comparison: CT abdomen and pelvis 04/11/2006 10/12/2004.

CLINICAL DATA: Unexplained abdominal pain. History of lymphoma in the past.
Surgical history includes cholecystectomy.

CT ABDOMEN AND PELVIS WITH CONTRAST 05/02/2007:
TECHNIQUE: Multidetector helical CT of the abdomen and pelvis was performed
during bolus administration of intravenous contrast. Oral contrast was given.
Delayed imaging through the kidneys was performed.
Contrast:  100 cc Omnipaque 300.

[Series 2: abd_pel 5.0 b40f · axial · 0.88mm/px · z∈[-449,-9]mm · 12 of 100 slices shown, 14 images]
[im 6/100  soft-tissue]
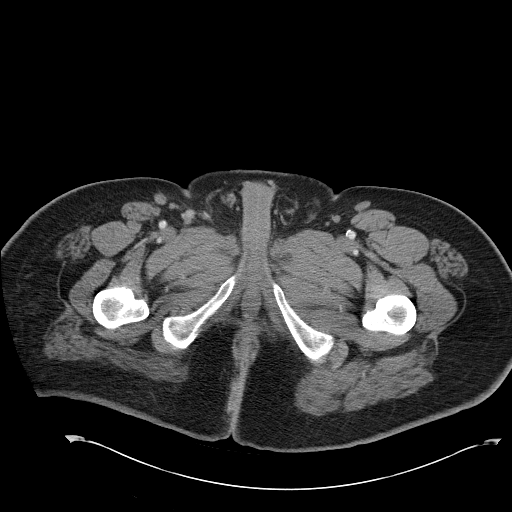
[im 6/100  bone]
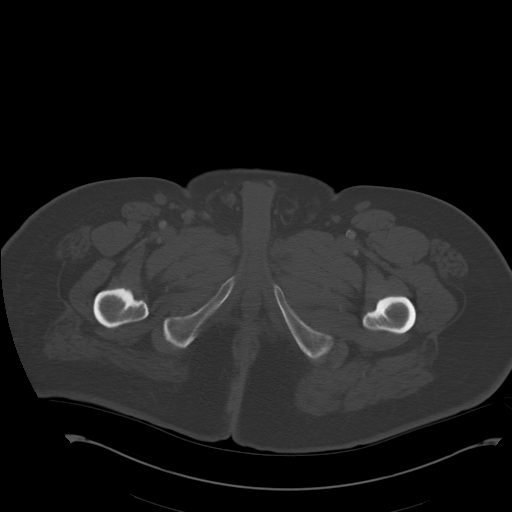
[im 18/100  soft-tissue]
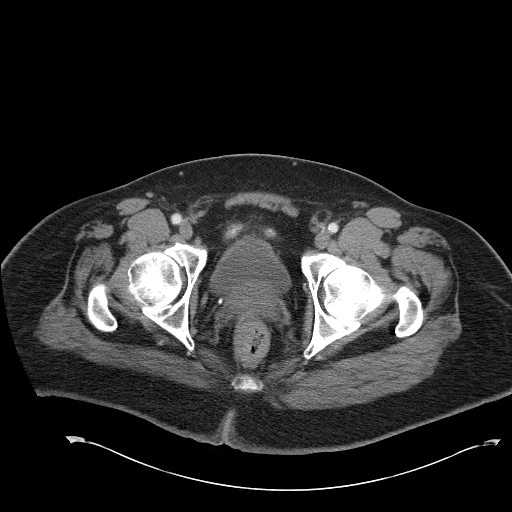
[im 24/100  soft-tissue]
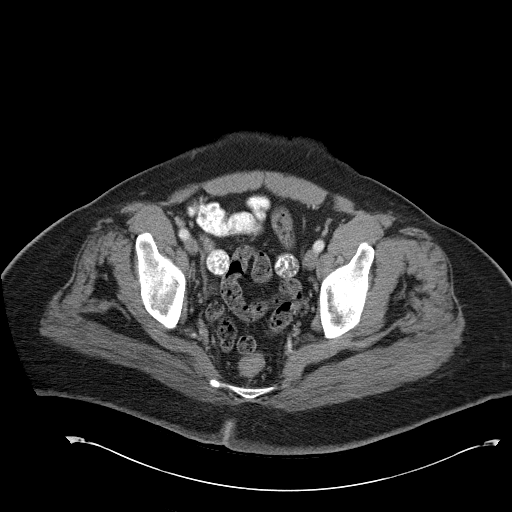
[im 30/100  soft-tissue]
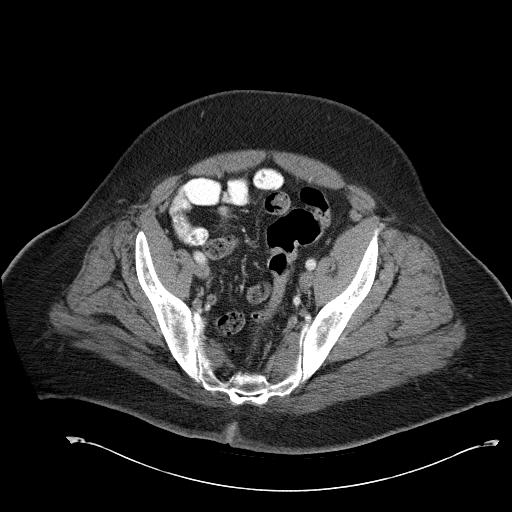
[im 41/100  soft-tissue]
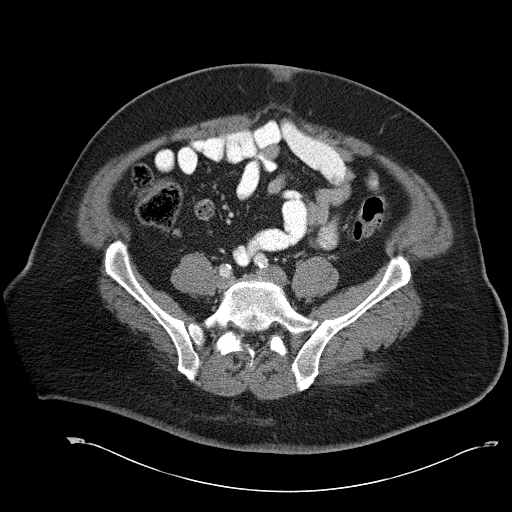
[im 47/100  soft-tissue]
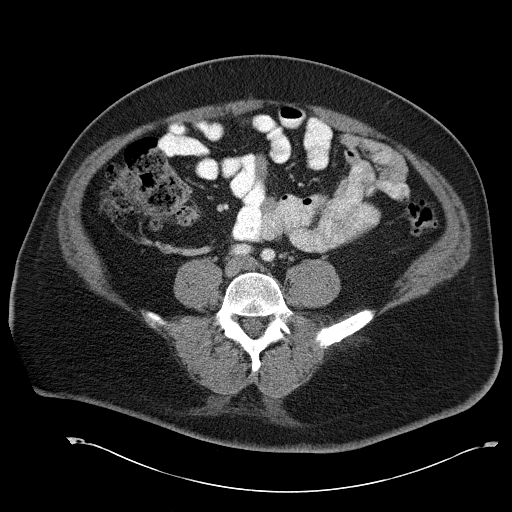
[im 53/100  soft-tissue]
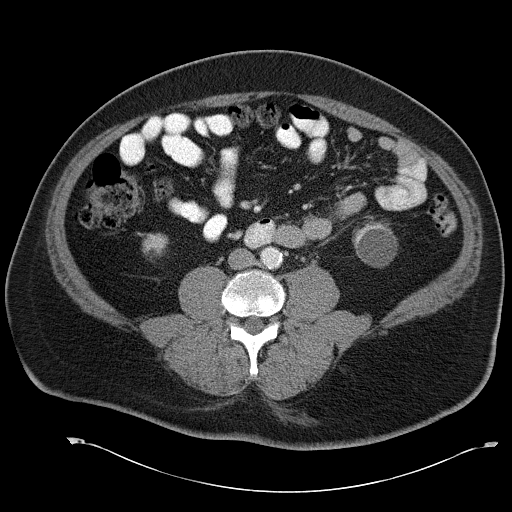
[im 65/100  soft-tissue]
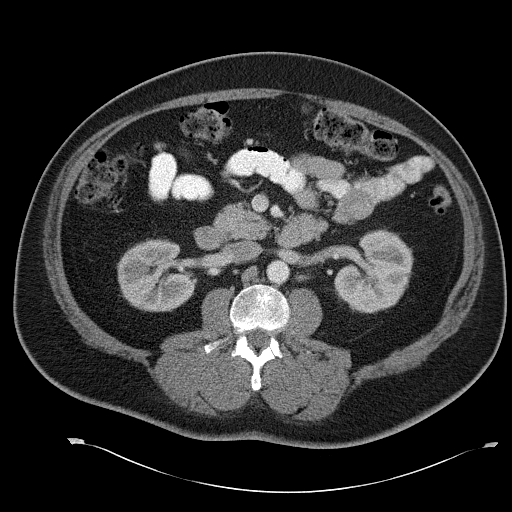
[im 70/100  soft-tissue]
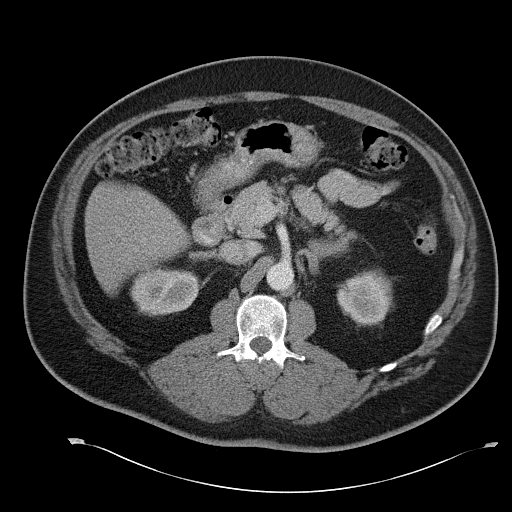
[im 70/100  bone]
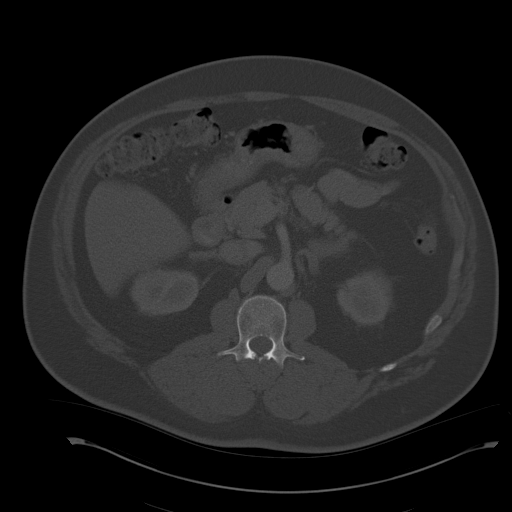
[im 76/100  soft-tissue]
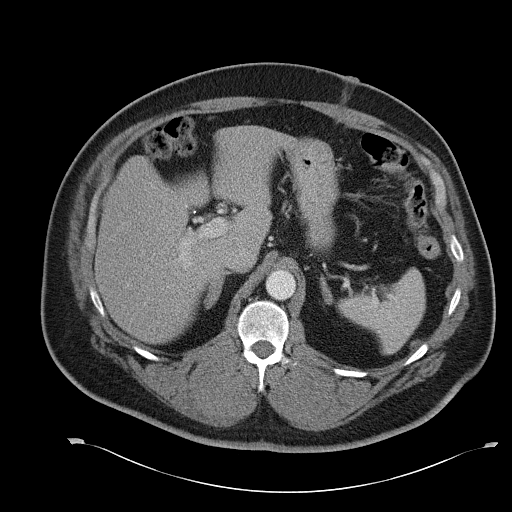
[im 88/100  soft-tissue]
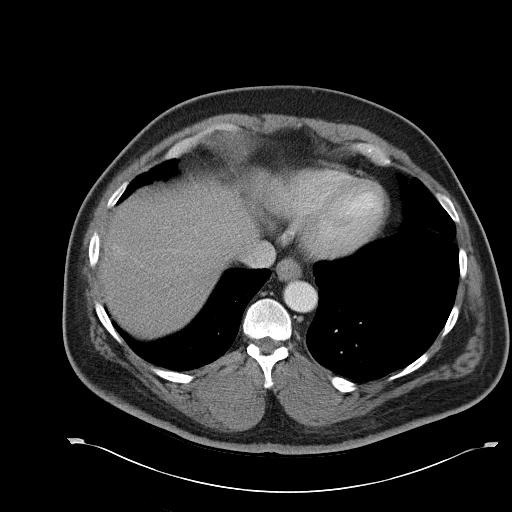
[im 94/100  soft-tissue]
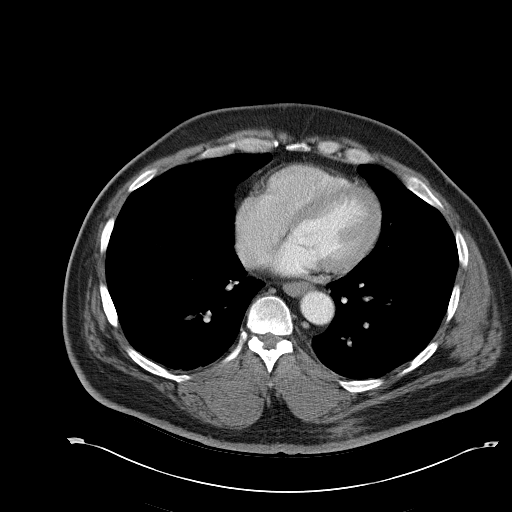

[Series 4: mpr coronal a/p · coronal · 0.78mm/px · 3 of 98 slices shown]
[im 33/98  soft-tissue]
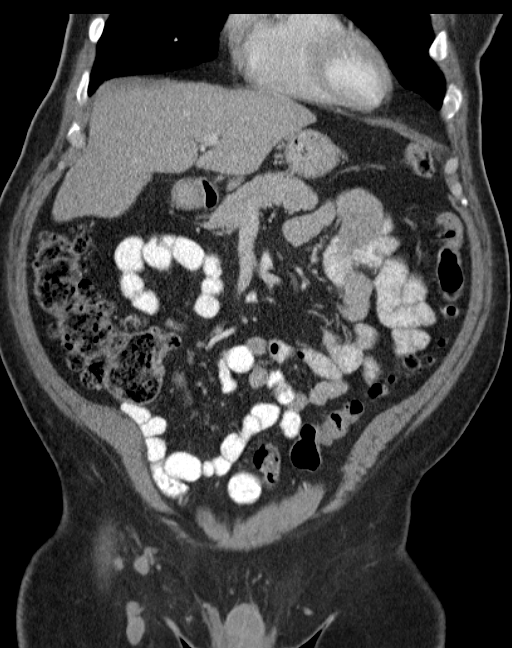
[im 44/98  soft-tissue]
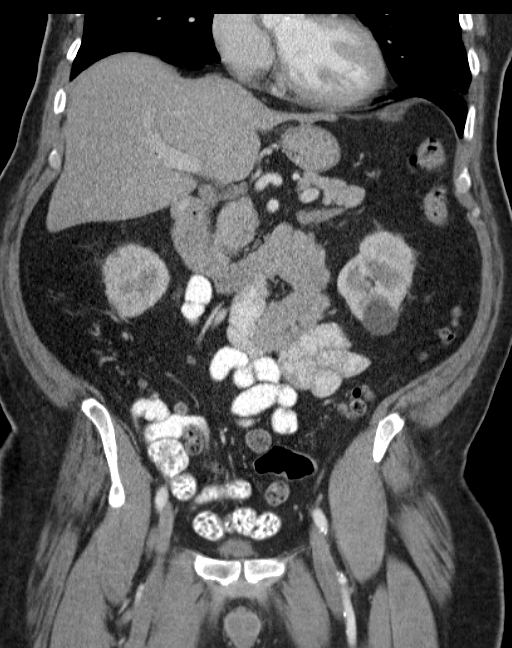
[im 54/98  soft-tissue]
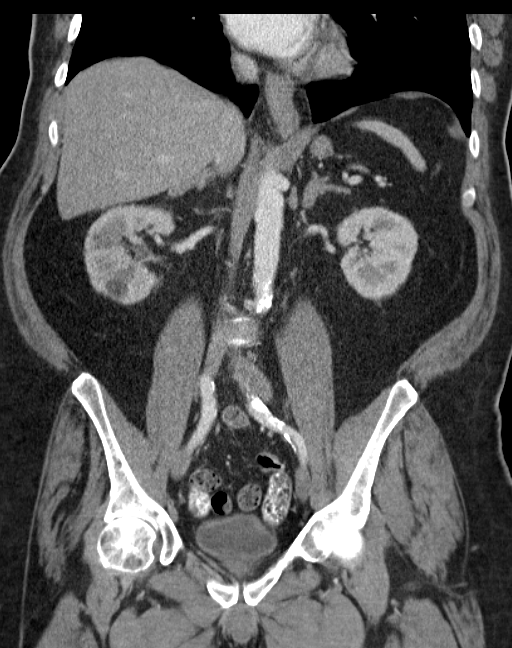

[15 of 46 positions shown; findings below may reference images not displayed]

CT ABDOMEN:

Hypervascular lesion in the anterior segment right lobe near the dome of the
liver less well-seen on the current examination but unchanged. Liver cyst just
superior to the falciform ligament unchanged. No new or significant
abnormalities involving the liver. Normal appearing spleen and pancreas.
Gallbladder surgically absent. No biliary ductal dilation. Mild adrenal
hyperplasia bilaterally, unchanged. Scattered cysts in both kidneys, the largest
arising from the lower pole of the left kidney on the order of 3.5 cm diameter.
No significant abnormalities involving either kidney. Mild abdominal aortic
atherosclerosis without aneurysm. Small hiatal hernia again demonstrated,
unchanged. Scattered diffuse colonic diverticulosis without evidence of
diverticulitis. No ascites. No significant lymphadenopathy. Visualized lung
bases demonstrating lingular scar and bilateral lower lobe bronchiectasis. Mild
degenerative changes again noted in the lumbar spine.
IMPRESSION: 1. No acute abnormalities in the abdomen.
2. Stable approximately 1.1 cm hypervascular right lobe liver lesion consistent
with small vascular malformation or hemangioma.
3. Stable small hiatal hernia.
4. Stable diffuse colonic diverticulosis without diverticulitis.

CT PELVIS:

Iliofemoral atherosclerosis without aneurysm. Distal descending and sigmoid
colon diverticulosis without evidence of diverticulitis. Mild prostate gland
enlargement consistent with age. Unremarkable urinary bladder. Visualize small
bowel unremarkable; stool-like appearance to the luminal contents of the distal
ileum may indicate some degree of small bowel dysmotility. No ascites. No
significant lymphadenopathy. Bone window images unremarkable.
IMPRESSION: 1. No acute abnormalities in the pelvis.
2. Stool-like appearance to the luminal contents of the distal ileum. Is there
clinical evidence of a small bowel motility disorder? The bowel itself is
non-dilated.
3. Mild age-appropriate prostate gland enlargement. 
4. Distal descending and sigmoid colon diverticulosis.

## 2007-12-20 ENCOUNTER — Ambulatory Visit: Payer: Self-pay | Admitting: Internal Medicine

## 2007-12-21 ENCOUNTER — Ambulatory Visit (HOSPITAL_COMMUNITY): Admission: RE | Admit: 2007-12-21 | Discharge: 2007-12-21 | Payer: Self-pay | Admitting: Internal Medicine

## 2007-12-31 ENCOUNTER — Encounter (INDEPENDENT_AMBULATORY_CARE_PROVIDER_SITE_OTHER): Payer: Self-pay | Admitting: Interventional Radiology

## 2007-12-31 ENCOUNTER — Ambulatory Visit (HOSPITAL_COMMUNITY): Admission: RE | Admit: 2007-12-31 | Discharge: 2007-12-31 | Payer: Self-pay | Admitting: Internal Medicine

## 2008-01-29 ENCOUNTER — Ambulatory Visit: Payer: Self-pay | Admitting: Internal Medicine

## 2008-02-05 ENCOUNTER — Encounter: Payer: Self-pay | Admitting: Internal Medicine

## 2008-02-05 ENCOUNTER — Ambulatory Visit (HOSPITAL_COMMUNITY): Admission: RE | Admit: 2008-02-05 | Discharge: 2008-02-05 | Payer: Self-pay | Admitting: Internal Medicine

## 2008-02-05 ENCOUNTER — Ambulatory Visit: Payer: Self-pay | Admitting: Internal Medicine

## 2008-02-05 HISTORY — PX: ESOPHAGOGASTRODUODENOSCOPY: SHX1529

## 2008-06-27 ENCOUNTER — Emergency Department (HOSPITAL_COMMUNITY): Admission: EM | Admit: 2008-06-27 | Discharge: 2008-06-27 | Payer: Self-pay | Admitting: *Deleted

## 2008-07-27 IMAGING — CT CT PELVIS W/O CM
1 of 2 series · 15 of 32 positions shown, 19 images · non-contrast
Comparison: 05/02/2007

CLINICAL DATA: Right-sided abdominal pain.

ABDOMEN CT WITHOUT CONTRAST
TECHNIQUE: Multidetector CT imaging of the abdomen was performed following the
standard protocol without IV contrast.
TECHNIQUE: Multidetector CT imaging of the pelvis was performed following the

[Series 2: stone 5.0 b40f · axial · 0.81mm/px · z∈[-489,-34]mm · 15 of 99 slices shown, 19 images]
[im 4/99  soft-tissue]
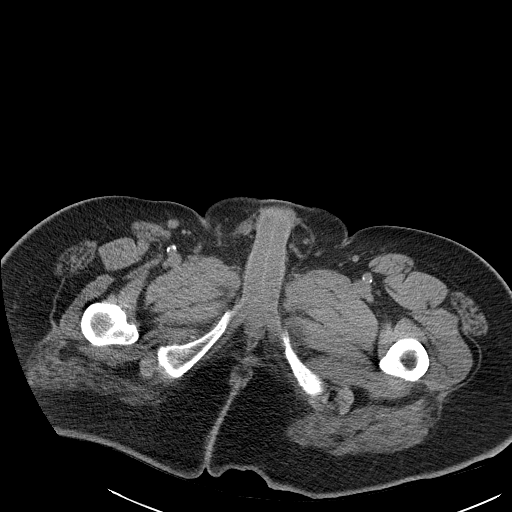
[im 4/99  bone]
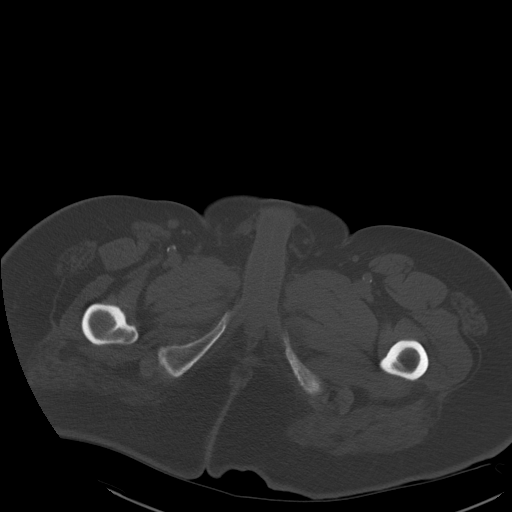
[im 12/99  soft-tissue]
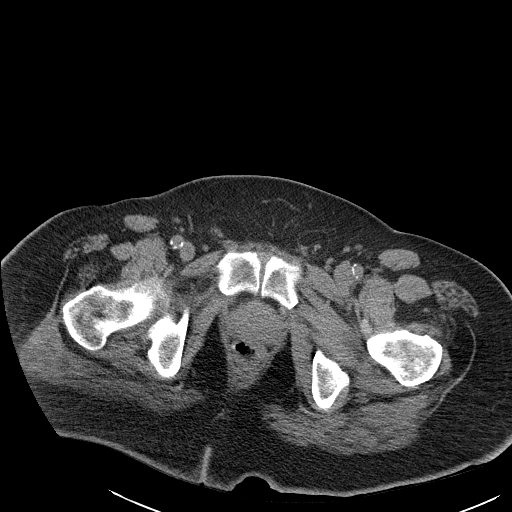
[im 19/99  soft-tissue]
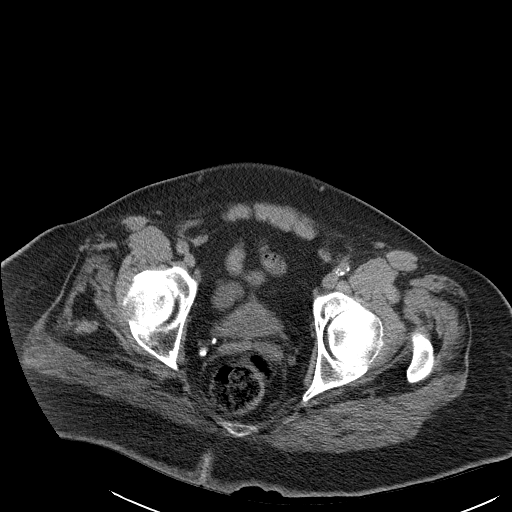
[im 27/99  soft-tissue]
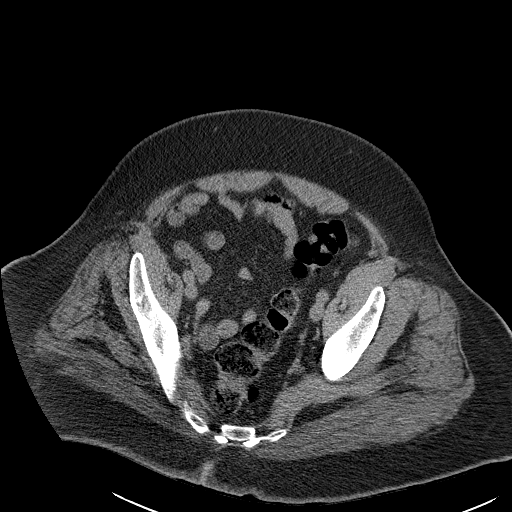
[im 34/99  soft-tissue]
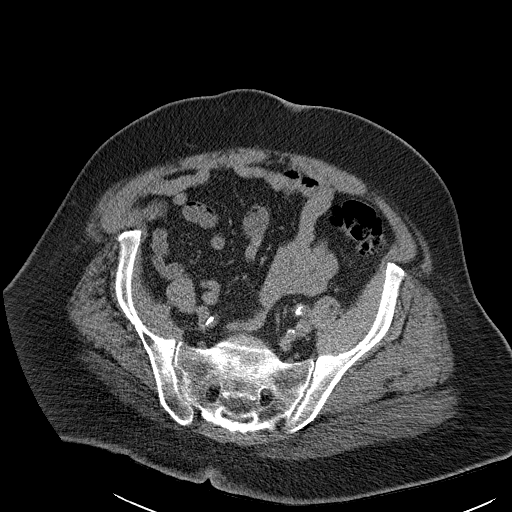
[im 42/99  soft-tissue]
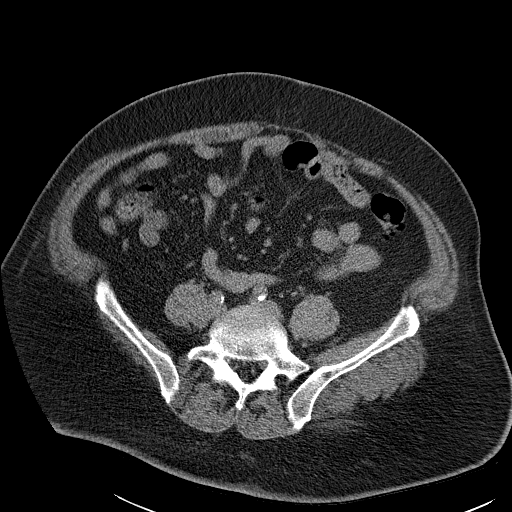
[im 50/99  soft-tissue]
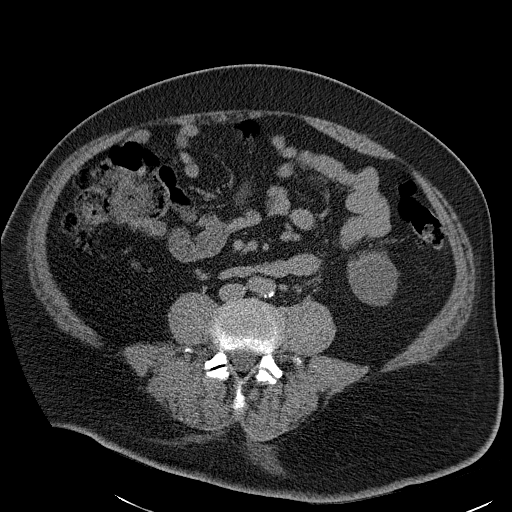
[im 57/99  soft-tissue]
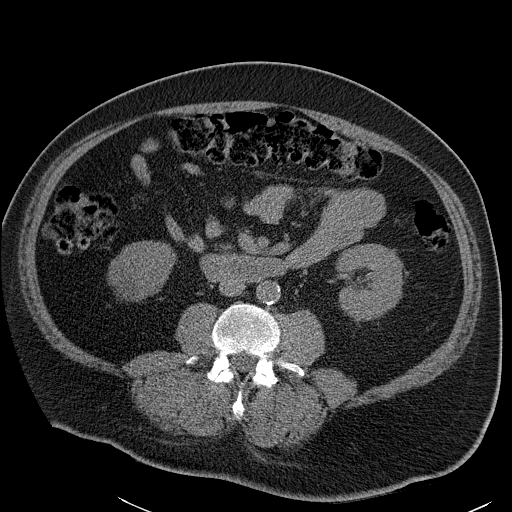
[im 65/99  soft-tissue]
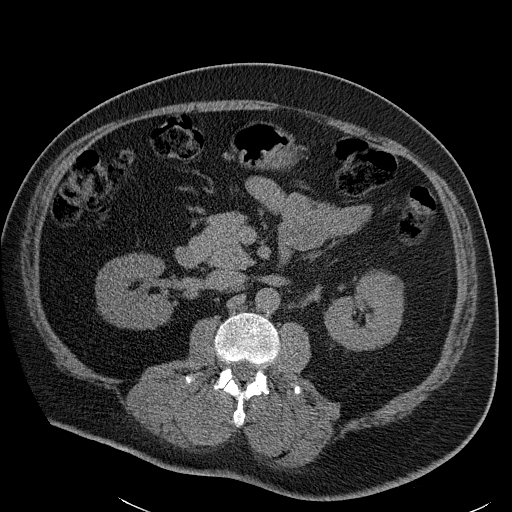
[im 65/99  bone]
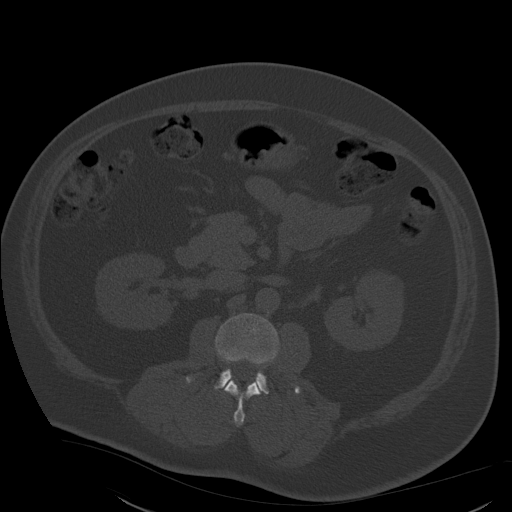
[im 72/99  soft-tissue]
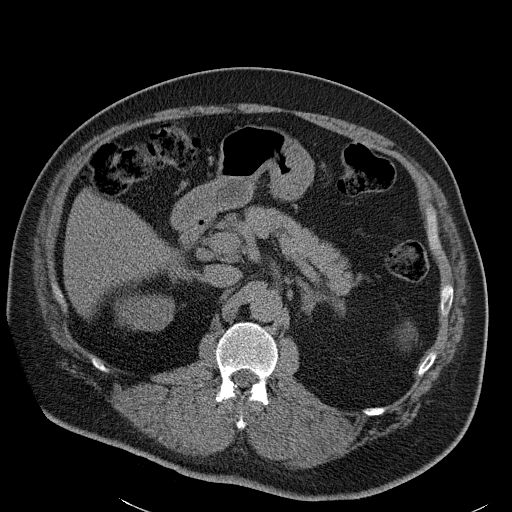
[im 80/99  soft-tissue]
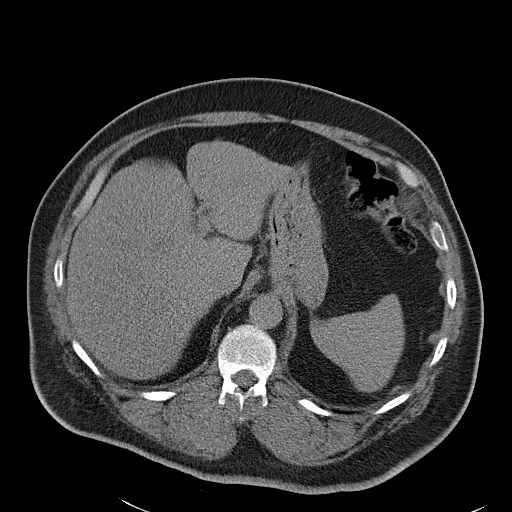
[im 83/99  lung]
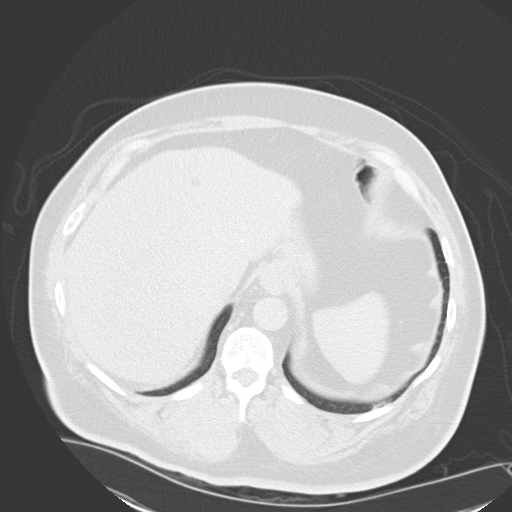
[im 87/99  soft-tissue]
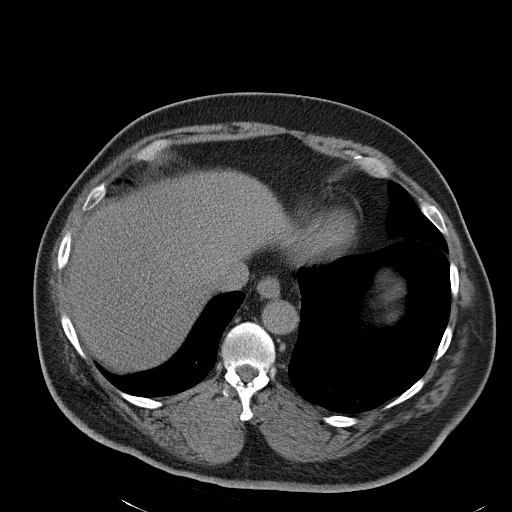
[im 87/99  lung]
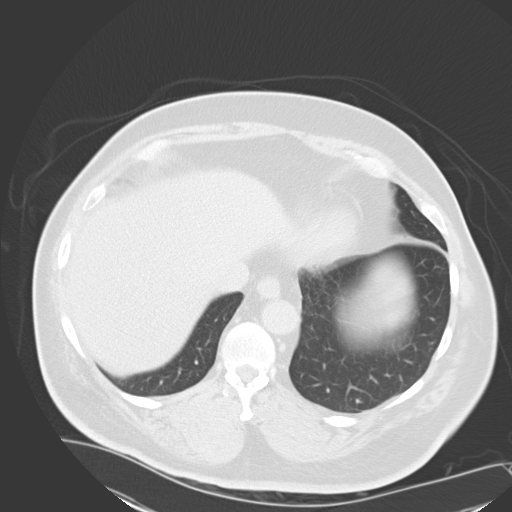
[im 91/99  lung]
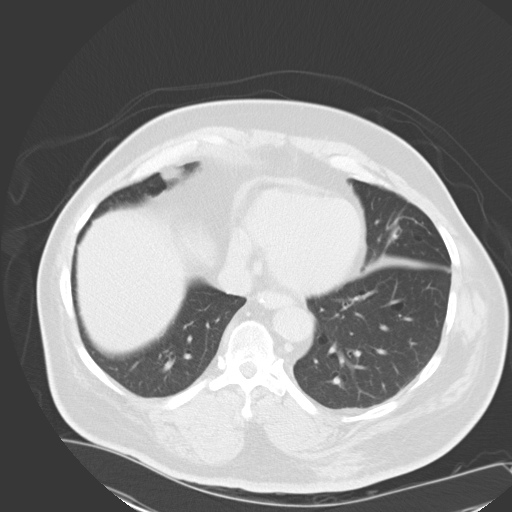
[im 95/99  soft-tissue]
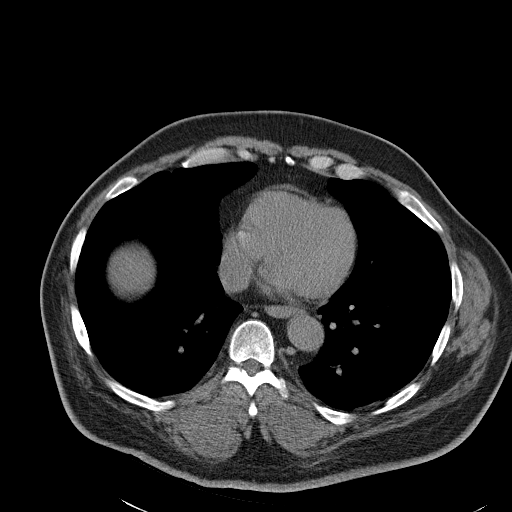
[im 95/99  lung]
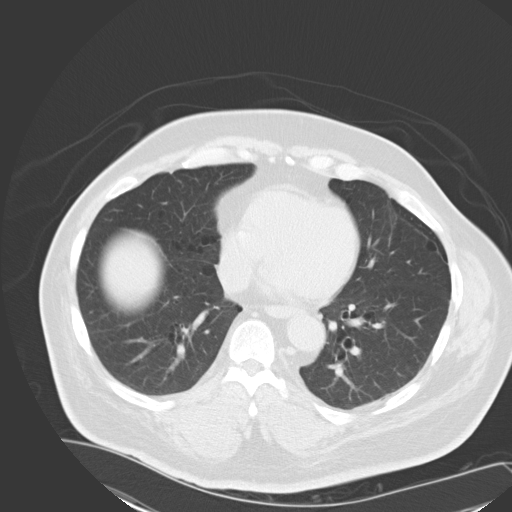

[15 of 32 positions shown; findings below may reference images not displayed]

FINDINGS: Mild lingular scarring is noted. Noncontrast CT appearance of the
liver, spleen, pancreas, and transverse colon is unremarkable. Healed PEG tube
tract noted. Fullness of the adrenal glands appear stable.

A 2 mm right kidney upper pole nonobstructive calculus is present. A small
perirenal stranding on the left, without evidence of hydronephrosis or
hydroureter. No discrete left renal calculus is identified. A hypodense lesion
of the left kidney upper pole is present and most likely represents a cyst. A
left kidney lower pole exophytic cyst is also present.

IMPRESSION

1. Nonobstructive 2 mm right kidney upper pole nonobstructive calculus.
2. Stable fullness of the adrenal glands.

PELVIS CT WITHOUT CONTRAST
FINDINGS: No distal ureteral calculus is identified. No right lower quadrant
inflammatory process is noted. Urinary bladder appears unremarkable. No pelvic
adenopathy identified.

IMPRESSION

No acute pelvic findings are noted.

## 2008-08-06 IMAGING — MR MR MRCP
4 of 7 series · 18 of 48 positions shown · non-contrast
Comparison: none

DUPLICATE COPY for exam association in [facility] - No change from original report,   12/24/07
CLINICAL DATA: Abdominal pain with nausea and vomiting.  Previous cholecystectomy four years ago. 
 ABDOMEN MRI WITHOUT CONTRAST (MRCP):
 3-DIMENSIONAL MR IMAGE RENDERING AT MR SCANNER:
TECHNIQUE: Multiplanar, multisequence MR imaging of the abdomen was performed, including heavily T2-weighted MIP images of the biliary and pancreatic ducts.  3-dimensional MR images were rendered by post-processing of the original MR data at the MR scanner.

[Series 2: coronal haste · coronal · 5.0mm · 0.78mm/px · 4 of 45 slices shown]
[im 1/45]
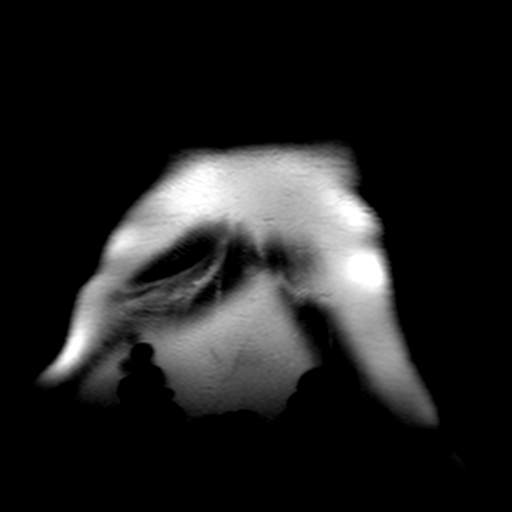
[im 15/45]
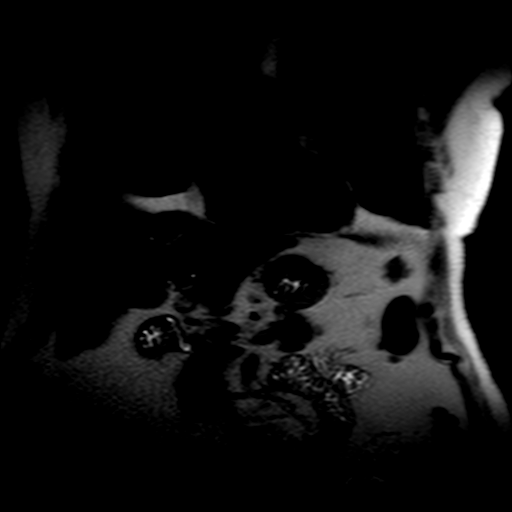
[im 30/45]
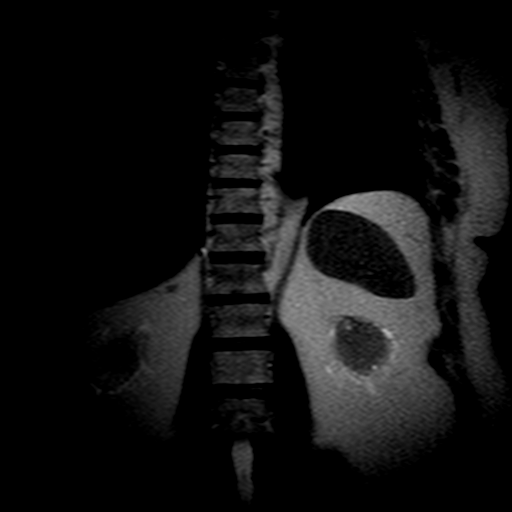
[im 45/45]
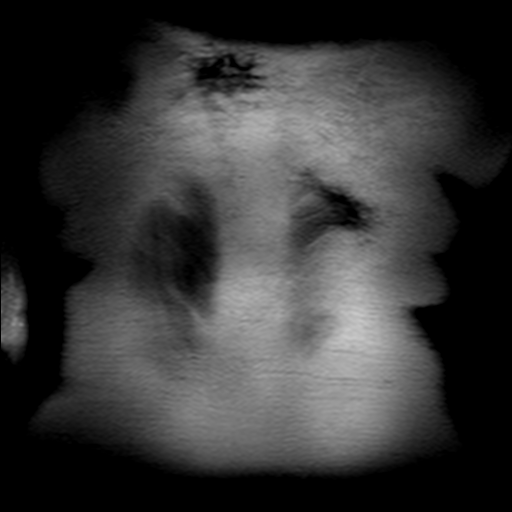

[Series 3: axial haste · axial · 5.0mm · 0.76mm/px · z∈[-141,+101]mm · 5 of 45 slices shown]
[im 1/45]
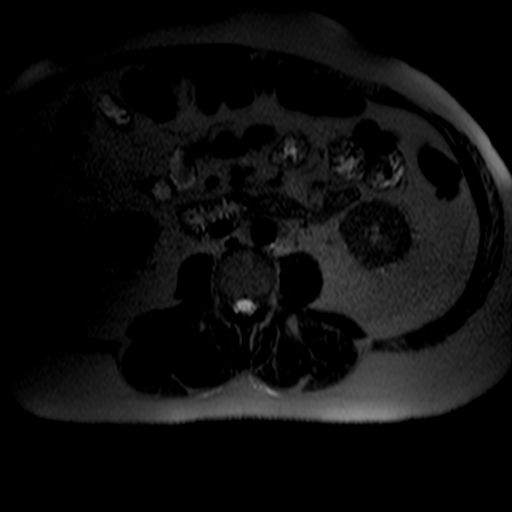
[im 12/45]
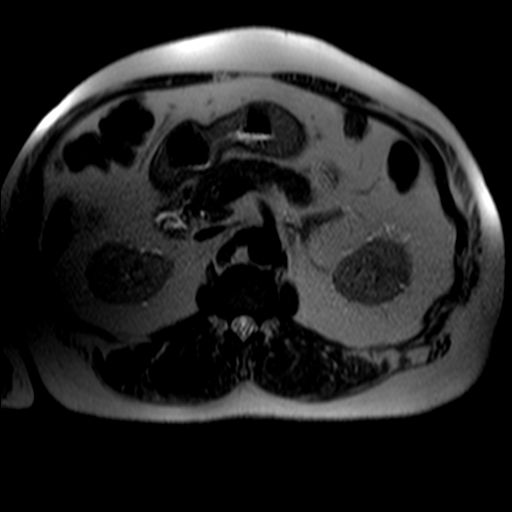
[im 23/45]
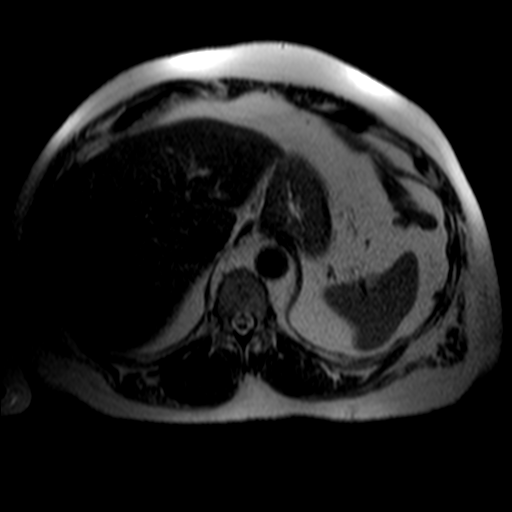
[im 34/45]
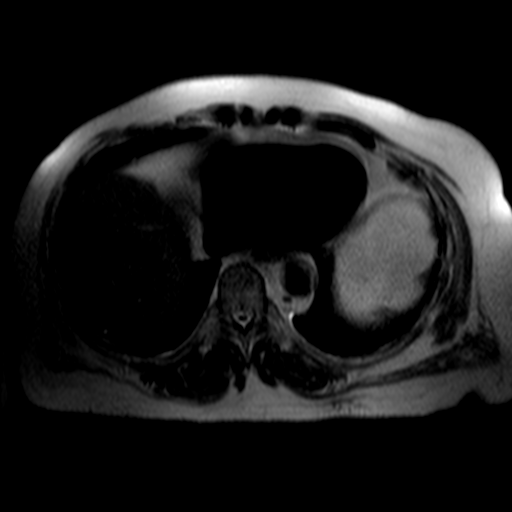
[im 45/45]
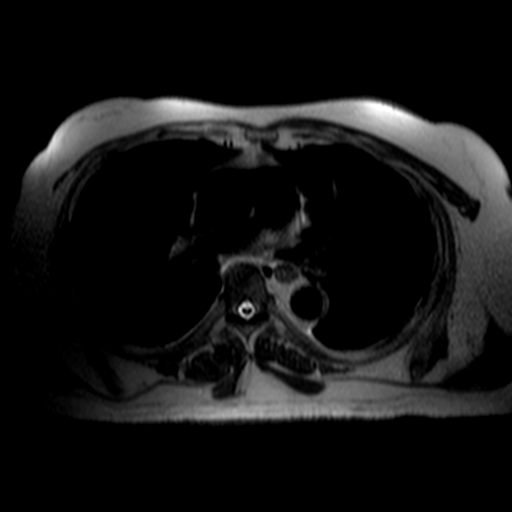

[Series 4: t2fs tse axial · axial · 4.0mm · 0.74mm/px · z∈[-108,+90]mm · 4 of 39 slices shown]
[im 1/39]
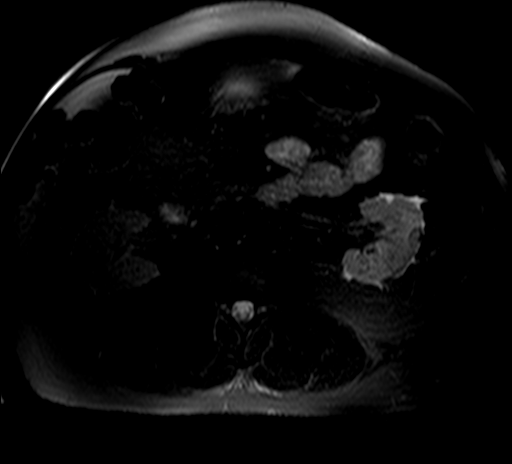
[im 13/39]
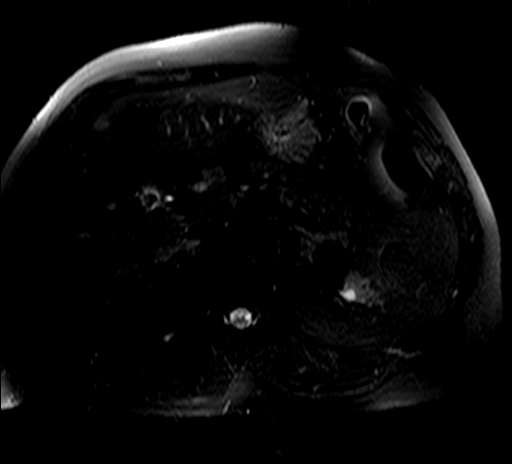
[im 26/39]
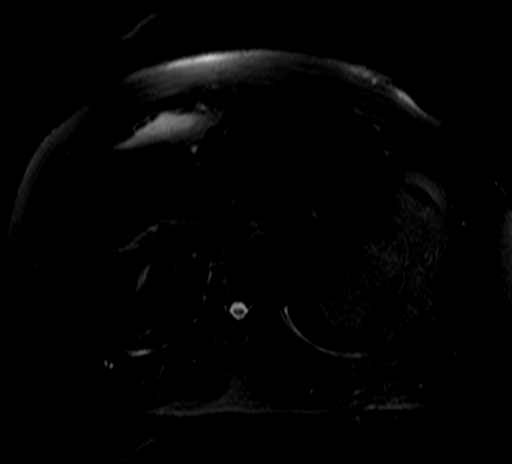
[im 39/39]
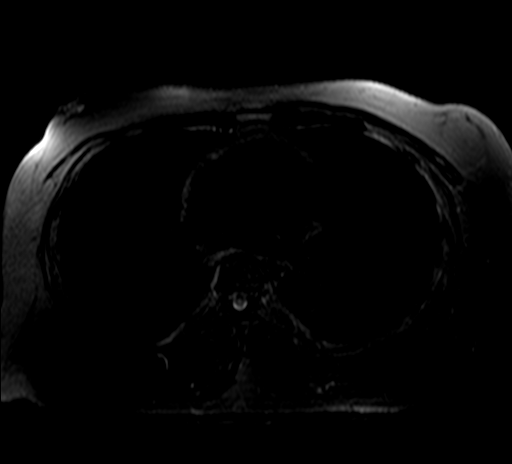

[Series 5: T1 · axial · 6.0mm · 0.61mm/px · z∈[-176,+54]mm · 5 of 80 slices shown]
[im 1/80]
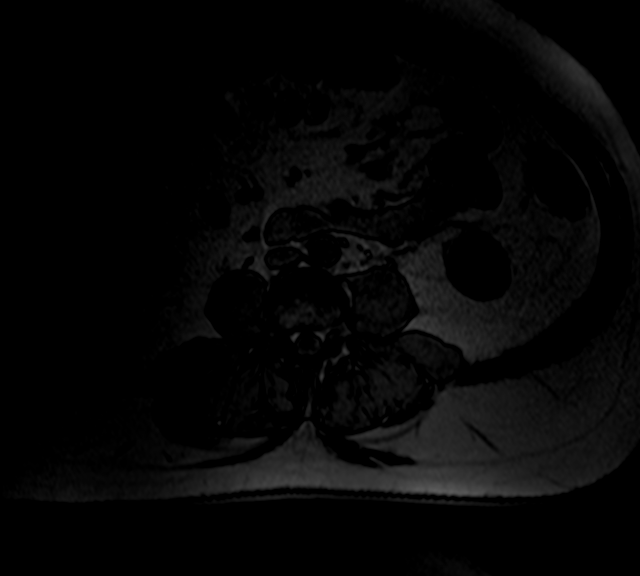
[im 12/80]
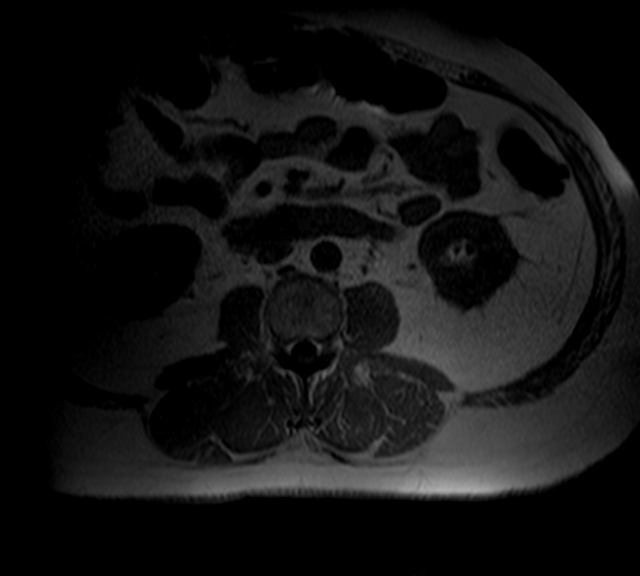
[im 23/80]
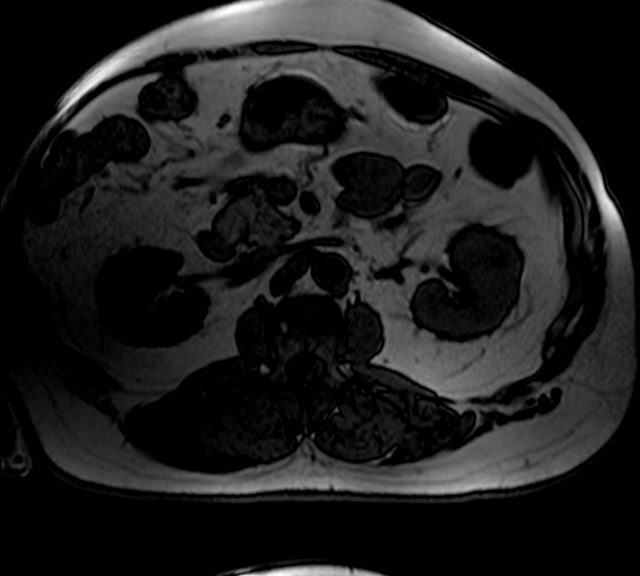
[im 46/80]
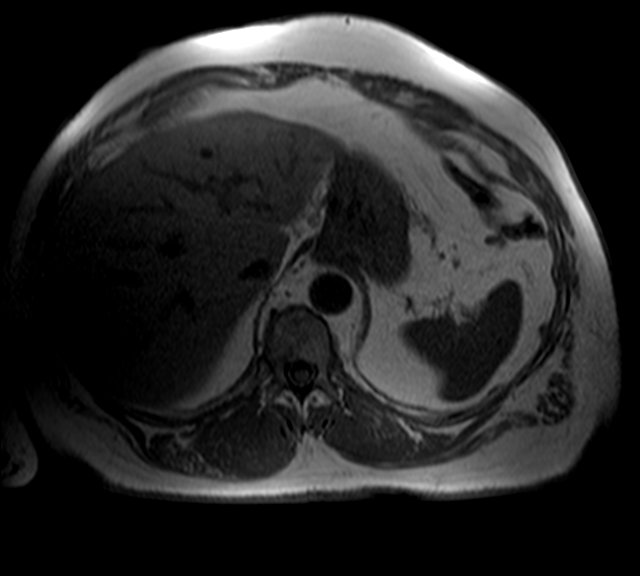
[im 68/80]
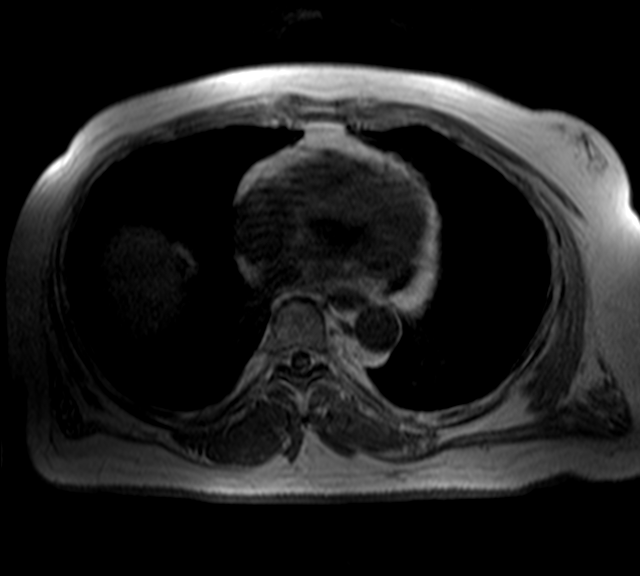

[18 of 48 positions shown; findings below may reference images not displayed]

FINDINGS: The scan demonstrates the biliary tree to be normal. The common hepatic and common bile ducts are well visualized and appear normal.  The pancreatic duct is normal.  Gallbladder has been removed.  There are a few tiny benign-appearing cysts in the liver and there are several benign-appearing cysts in each kidney with the largest being 4.1 cm in size on the lower pole of the left kidney.  The pancreas and adrenal glands are normal. No adenopathy or other significant abnormalities.
IMPRESSION: Normal MRCP.

## 2008-08-16 IMAGING — US US BIOPSY
1 series · 11 of 11 positions shown · non-contrast
Comparison: none

CLINICAL DATA: Elevated liver function tests, remote history of lymphoma; request is made for random core liver biopsy.
ULTRASOUND-GUIDED RANDOM CORE LIVER BIOPSY:

[Series 1: unknown · 0.33mm/px · 11 of 11 slices shown]
[im 1/11]
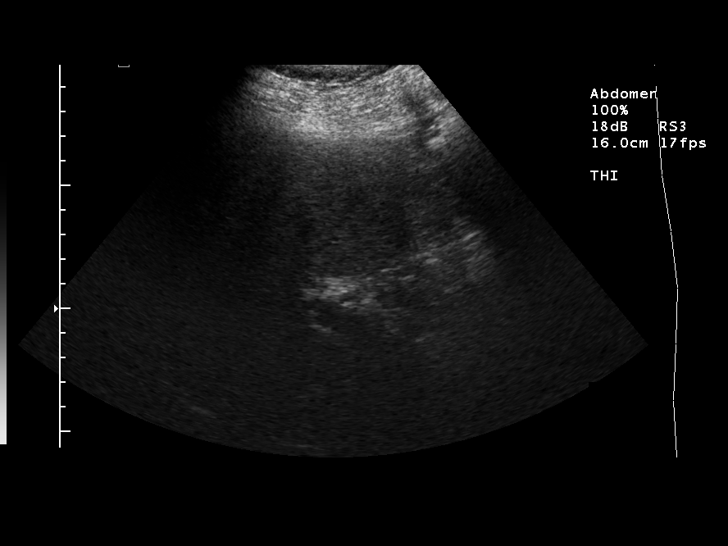
[im 2/11]
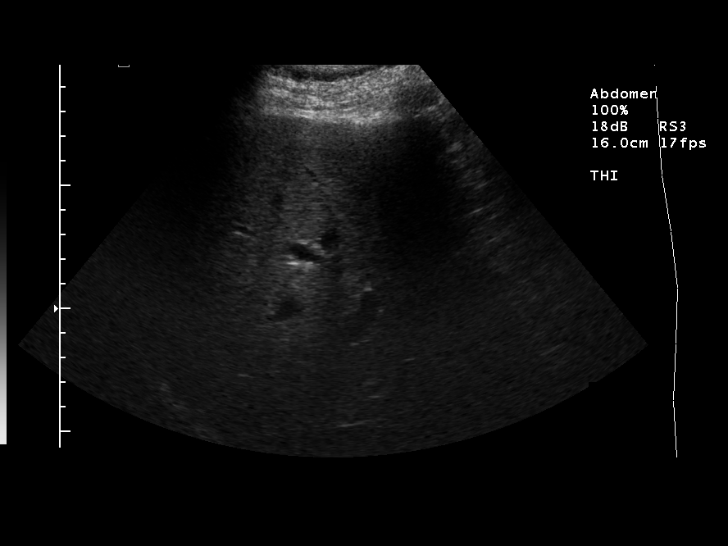
[im 3/11]
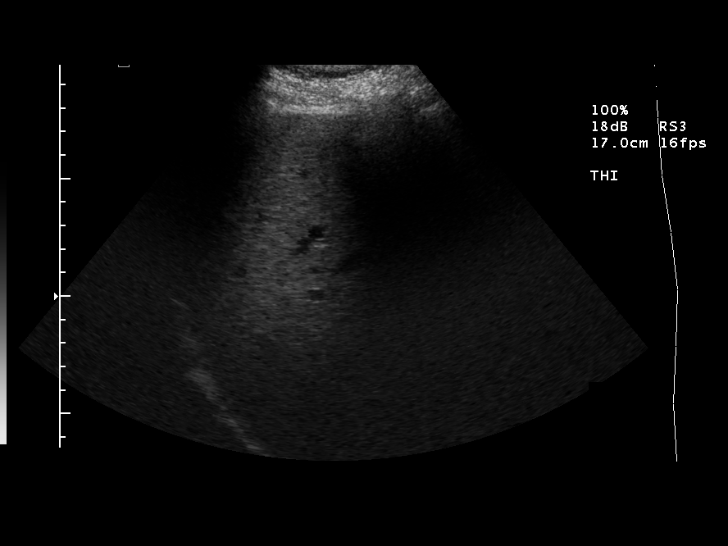
[im 4/11]
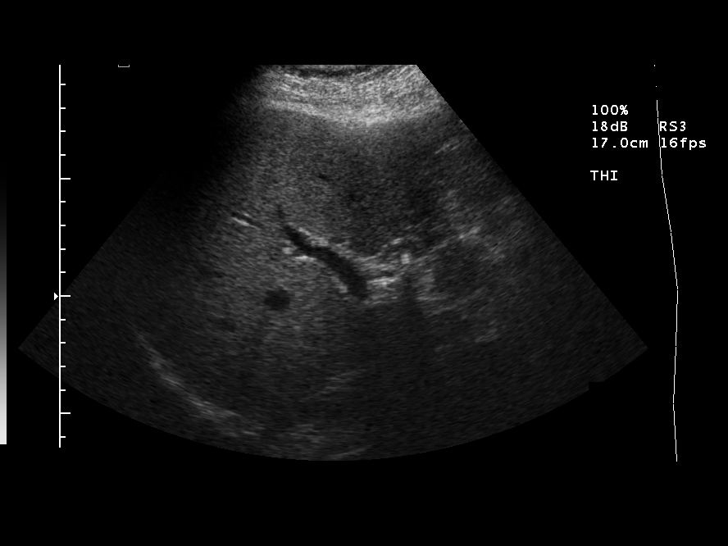
[im 5/11]
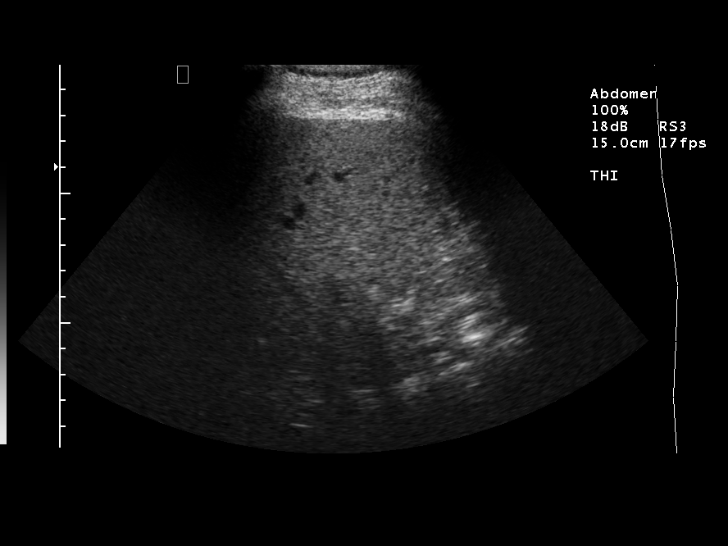
[im 6/11]
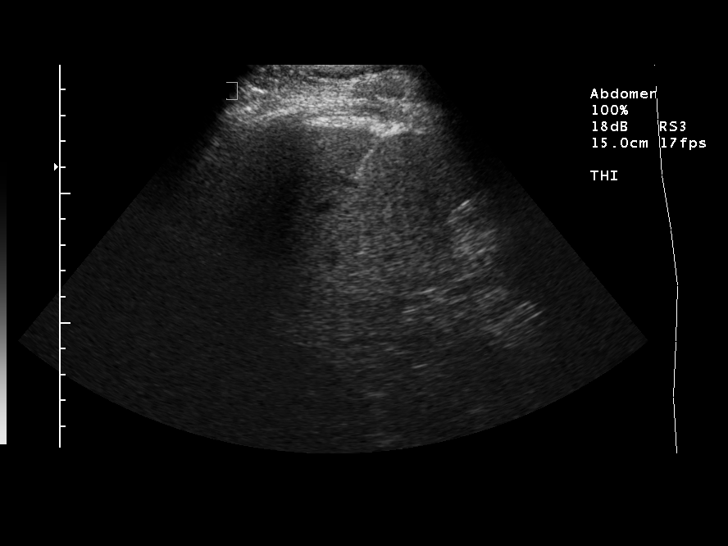
[im 7/11]
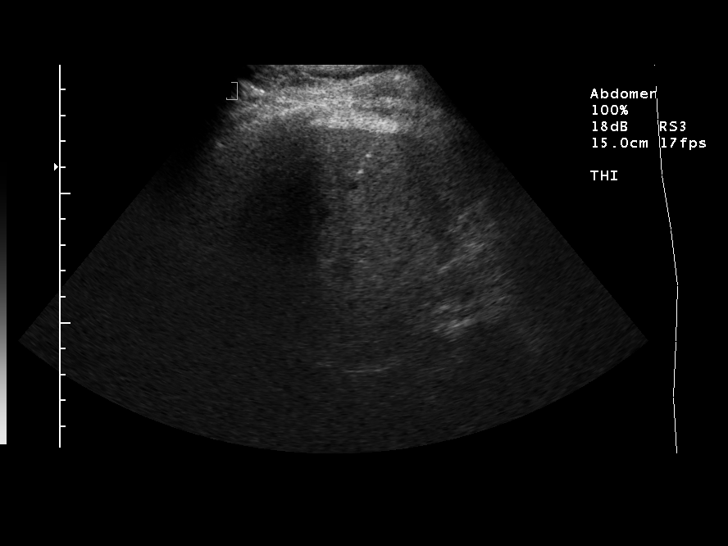
[im 8/11]
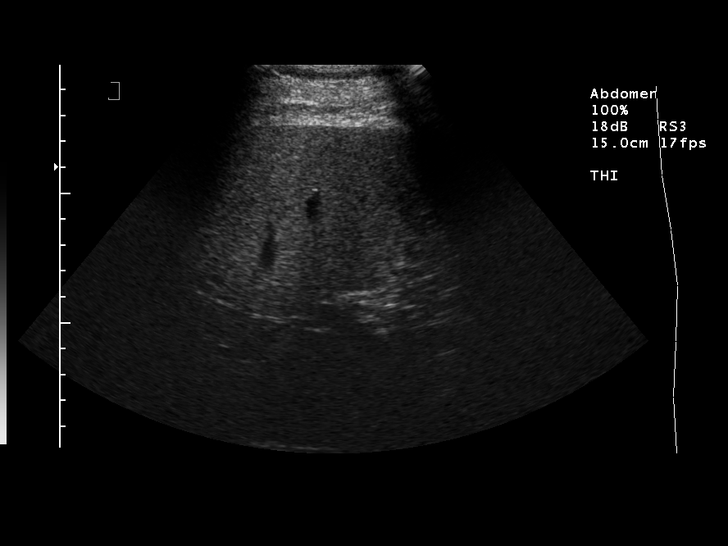
[im 9/11]
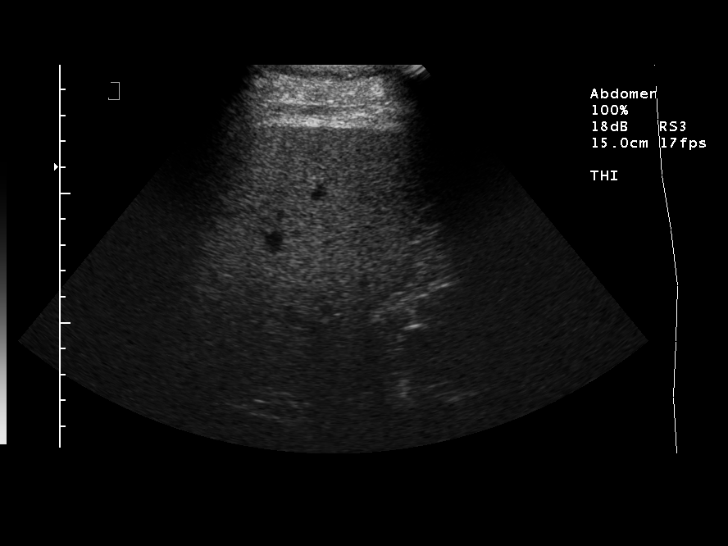
[im 10/11]
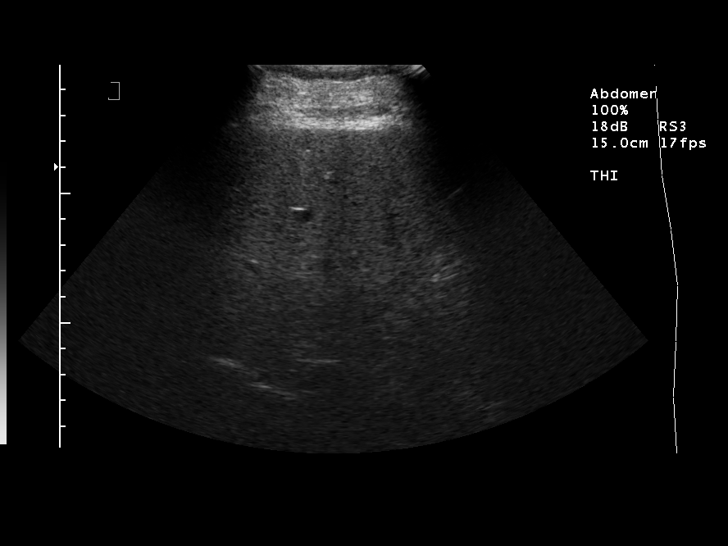
[im 11/11]
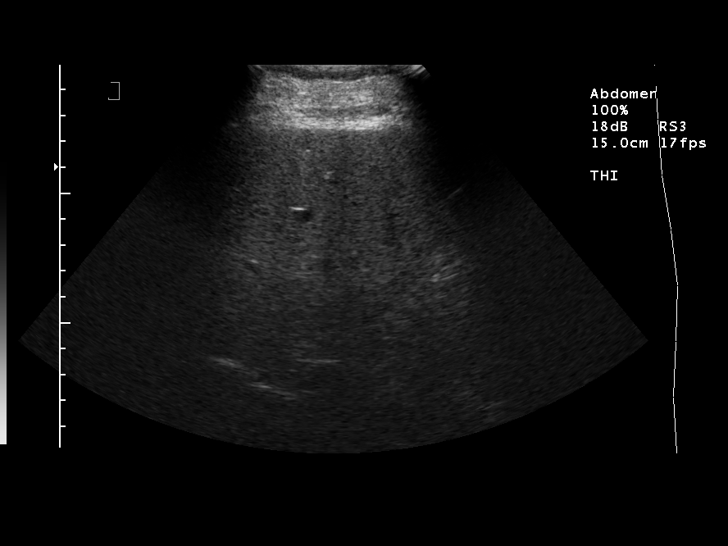

[11 of 11 positions shown; findings below may reference images not displayed]

FINDINGS: An ultrasound-guided random core liver biopsy was thoroughly discussed with the patient and questions were answered.  The benefits, risks, alternatives, and complications were also discussed.  The patient understands and wishes to proceed with the procedure.  Verbal as well as written consent was obtained.
Under ultrasound guidance, an appropriate skin site was marked.  The patient was then prepped and draped in the normal sterile fashion.  1% lidocaine was used for local anesthesia.  Through a 17-gauge guiding trocar, three passes were then made into the right hepatic lobe with an 18-gauge biopsy gun.  Ultrasound imaging confirmed appropriate needle placement in the liver parenchyma.  Specimens were sent to pathology for further evaluation.  The patient tolerated the procedure well and there were no immediate complications.  
Medications utilized:  Versed 2 mg IV, fentanyl 100 micrograms IV.  Cardiorespiratory monitoring was performed by the interventional radiology nurse during the procedure.  The patient?s vital signs remained stable throughout the procedure, and he will be observed in the [REDACTED] for an additional 3 hours post-biopsy and then discharged home afterwards if stable.
Total Time of Sedation:  20 minutes.
Procedure was performed under the supervision of Dr. Rudis Muro.
IMPRESSION: Successful ultrasound-guided random core liver biopsy of the right hepatic lobe as discussed above.

## 2009-01-05 DIAGNOSIS — E1122 Type 2 diabetes mellitus with diabetic chronic kidney disease: Secondary | ICD-10-CM

## 2009-01-05 DIAGNOSIS — Z794 Long term (current) use of insulin: Secondary | ICD-10-CM

## 2009-01-05 DIAGNOSIS — K5730 Diverticulosis of large intestine without perforation or abscess without bleeding: Secondary | ICD-10-CM | POA: Insufficient documentation

## 2009-01-05 DIAGNOSIS — N182 Chronic kidney disease, stage 2 (mild): Secondary | ICD-10-CM

## 2009-01-05 DIAGNOSIS — Z8579 Personal history of other malignant neoplasms of lymphoid, hematopoietic and related tissues: Secondary | ICD-10-CM

## 2009-01-05 DIAGNOSIS — K2270 Barrett's esophagus without dysplasia: Secondary | ICD-10-CM | POA: Insufficient documentation

## 2009-01-05 DIAGNOSIS — I10 Essential (primary) hypertension: Secondary | ICD-10-CM | POA: Insufficient documentation

## 2009-01-05 DIAGNOSIS — Z8572 Personal history of non-Hodgkin lymphomas: Secondary | ICD-10-CM | POA: Insufficient documentation

## 2009-01-05 DIAGNOSIS — M109 Gout, unspecified: Secondary | ICD-10-CM | POA: Insufficient documentation

## 2009-01-05 DIAGNOSIS — E119 Type 2 diabetes mellitus without complications: Secondary | ICD-10-CM | POA: Insufficient documentation

## 2009-04-02 ENCOUNTER — Emergency Department (HOSPITAL_COMMUNITY): Admission: EM | Admit: 2009-04-02 | Discharge: 2009-04-02 | Payer: Self-pay | Admitting: Emergency Medicine

## 2009-10-02 ENCOUNTER — Encounter (INDEPENDENT_AMBULATORY_CARE_PROVIDER_SITE_OTHER): Payer: Self-pay | Admitting: *Deleted

## 2009-10-12 ENCOUNTER — Ambulatory Visit (HOSPITAL_COMMUNITY): Admission: RE | Admit: 2009-10-12 | Discharge: 2009-10-12 | Payer: Self-pay | Admitting: Internal Medicine

## 2009-10-12 ENCOUNTER — Encounter: Payer: Self-pay | Admitting: Internal Medicine

## 2009-10-20 ENCOUNTER — Encounter: Payer: Self-pay | Admitting: Internal Medicine

## 2009-11-16 ENCOUNTER — Encounter: Payer: Self-pay | Admitting: Internal Medicine

## 2009-11-17 IMAGING — CR DG CHEST 1V PORT
1 series · 1 of 1 positions shown · non-contrast
Comparison: 06/27/2008

CLINICAL DATA: Stroke.  Left-sided weakness.

PORTABLE CHEST - 1 VIEW

[view not recorded]
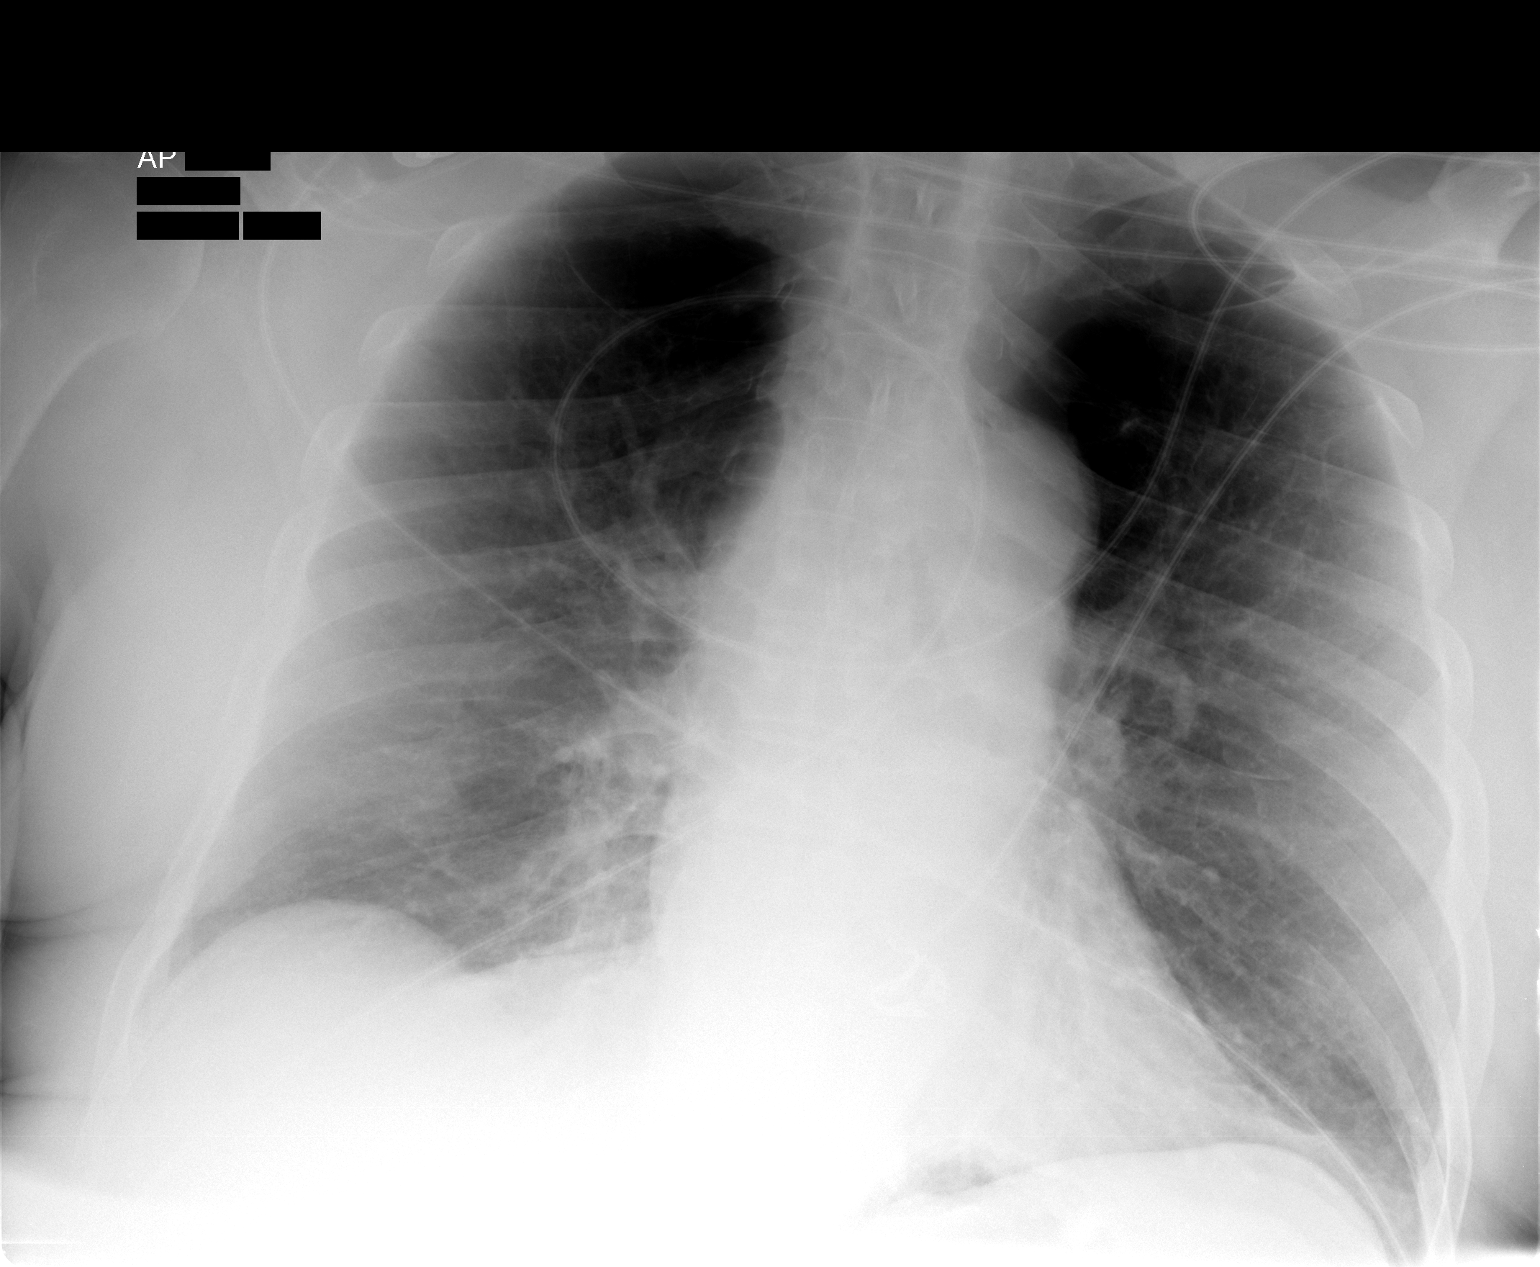

[1 of 1 positions shown; findings below may reference images not displayed]

FINDINGS: Pulmonary emphysema is noted.  There is no evidence of
pulmonary infiltrate or edema.  There is no evidence of pleural
effusion.  Heart size is within normal limits.
IMPRESSION: Pulmonary emphysema.  No acute findings.

## 2009-11-17 IMAGING — CT CT HEAD W/O CM
1 series · 16 of 30 positions shown, 20 images · non-contrast
Comparison: None.

CLINICAL DATA: Left-sided weakness.

CT HEAD WITHOUT CONTRAST
TECHNIQUE: Contiguous axial images were obtained from the base of
the skull through the vertex without contrast.

[Series 2: headseq 4.8 h37s · axial · 0.46mm/px · z∈[+106,+266]mm · 16 of 36 slices shown, 20 images]
[im 2/36  brain]
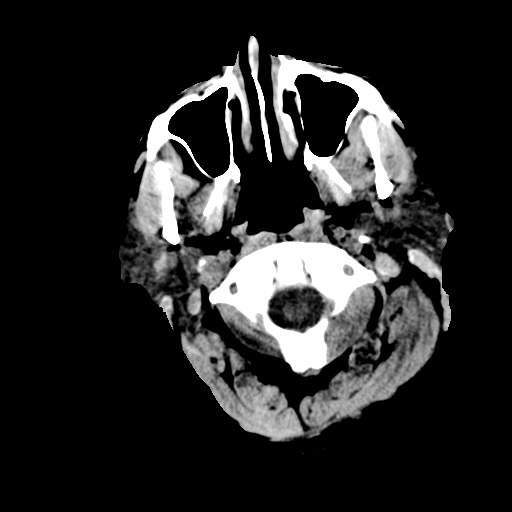
[im 2/36  bone]
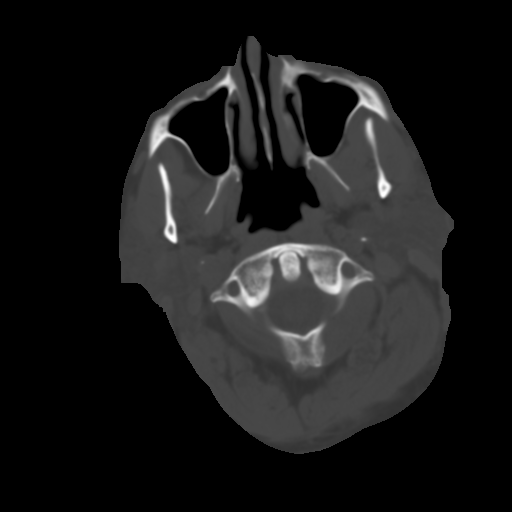
[im 4/36  brain]
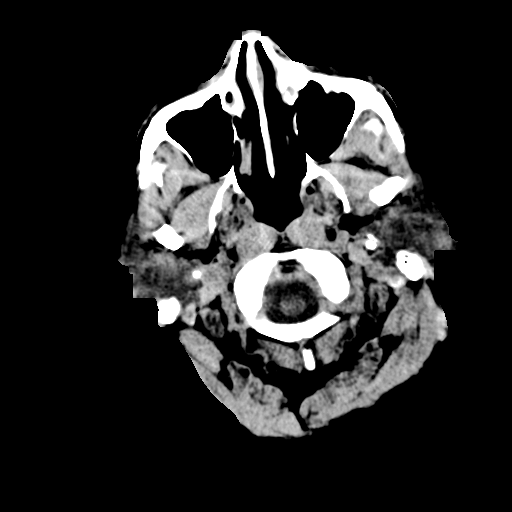
[im 7/36  brain]
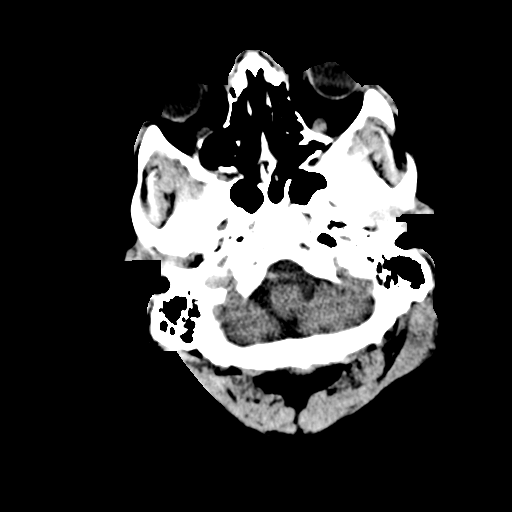
[im 9/36  brain]
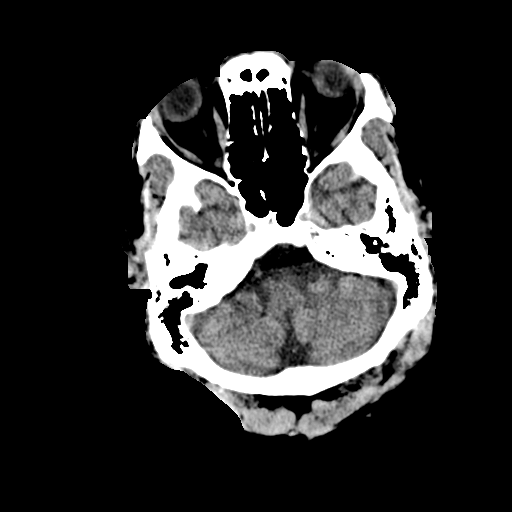
[im 10/36  brain]
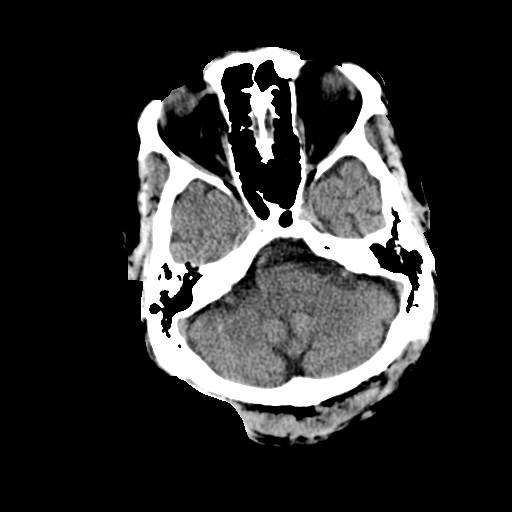
[im 10/36  bone]
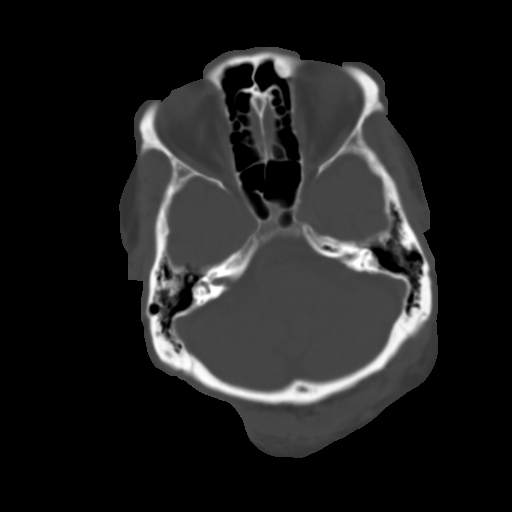
[im 13/36  brain]
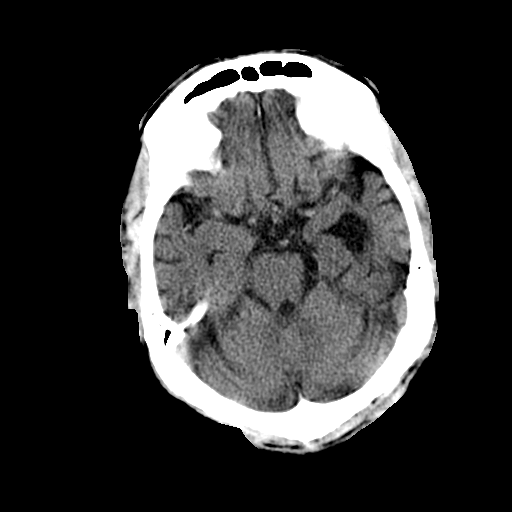
[im 15/36  brain]
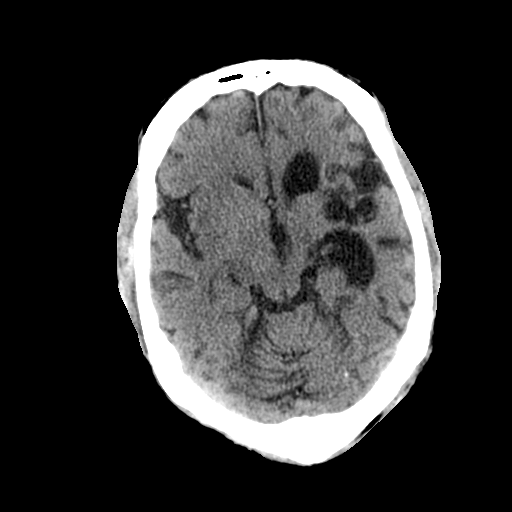
[im 17/36  brain]
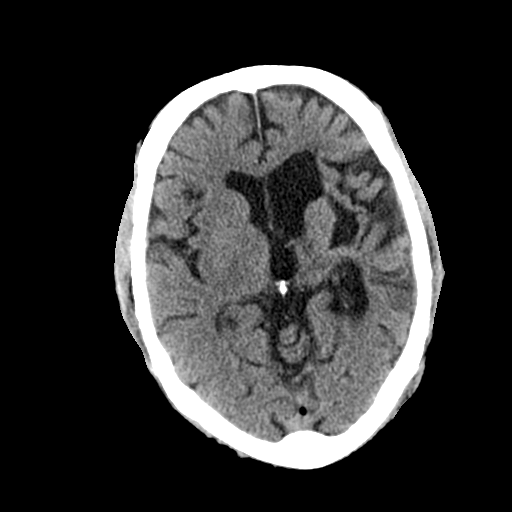
[im 19/36  brain]
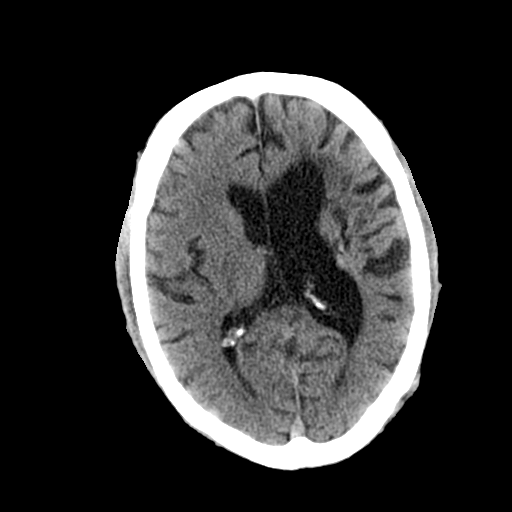
[im 19/36  bone]
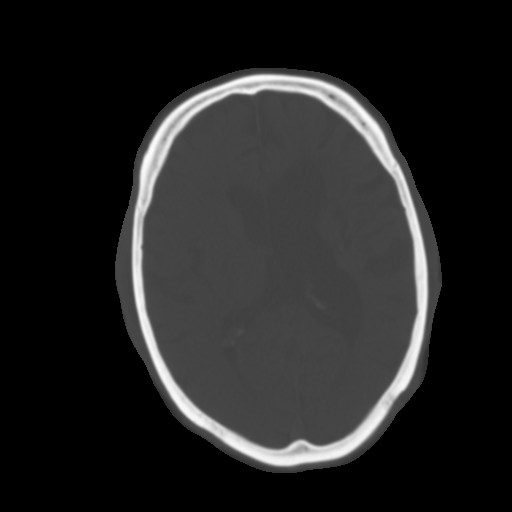
[im 21/36  brain]
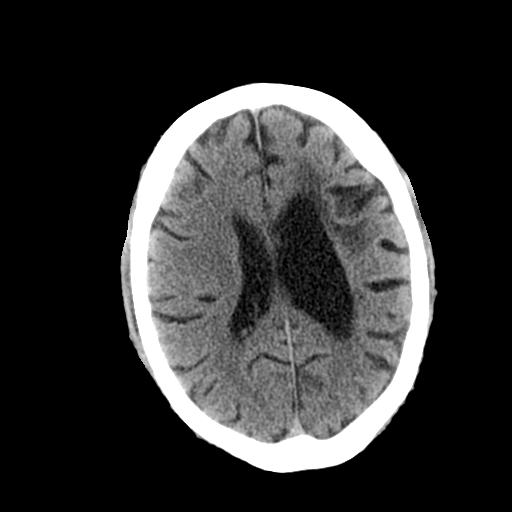
[im 23/36  brain]
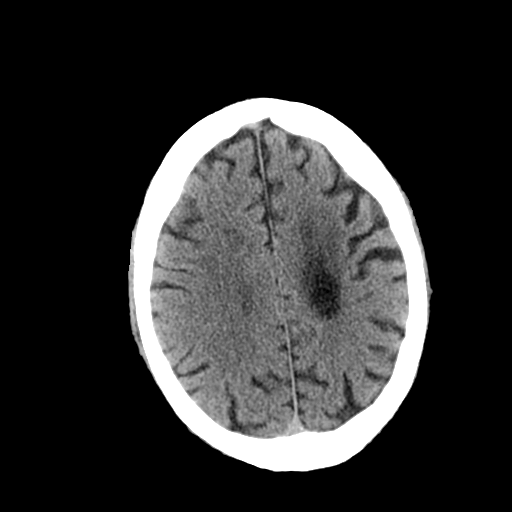
[im 26/36  brain]
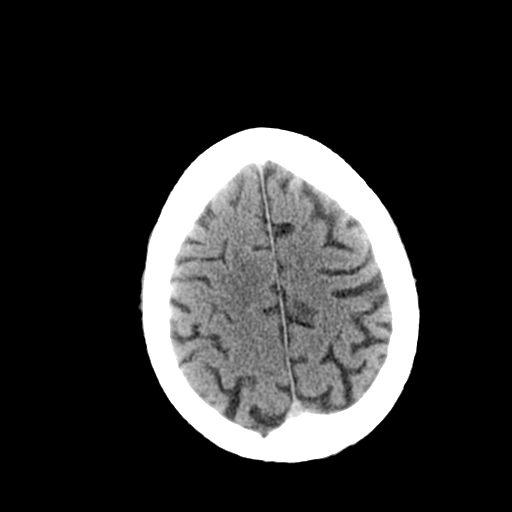
[im 27/36  brain]
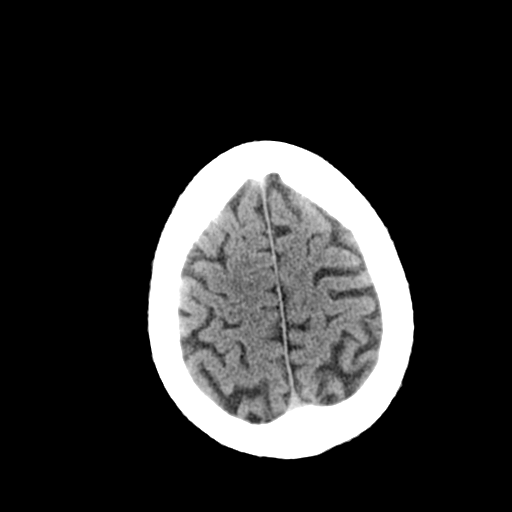
[im 27/36  bone]
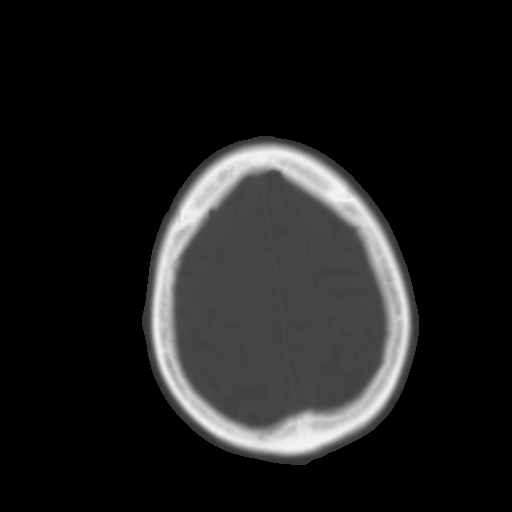
[im 29/36  brain]
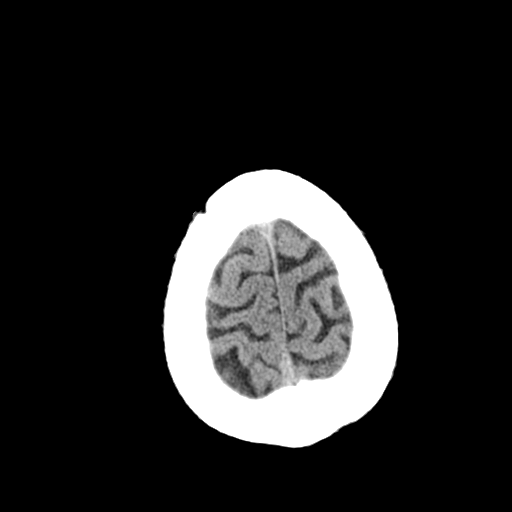
[im 32/36  brain]
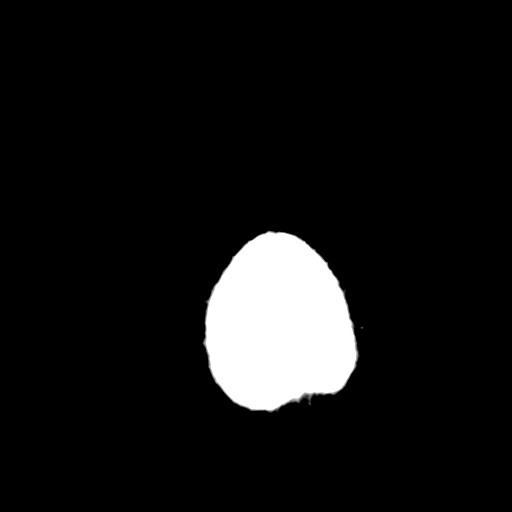
[im 34/36  brain]
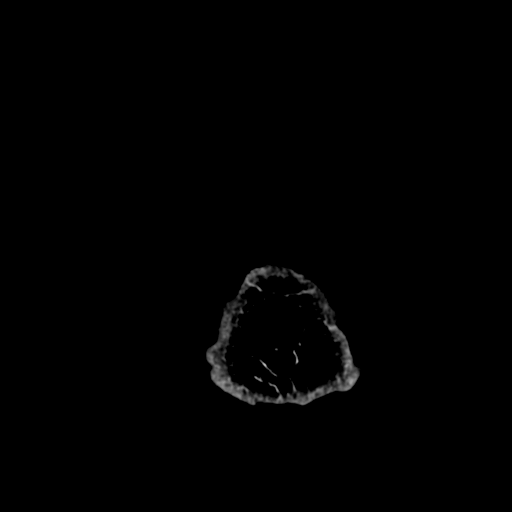

[16 of 30 positions shown; findings below may reference images not displayed]

FINDINGS: Chronic left MCA infarct.  Left ventricle is dilated due
to volume loss.  There is no acute infarct.  There is no hemorrhage
or mass.
IMPRESSION: Chronic left MCA infarct.  No acute abnormality.

## 2009-12-12 HISTORY — PX: OTHER SURGICAL HISTORY: SHX169

## 2009-12-18 ENCOUNTER — Encounter: Payer: Self-pay | Admitting: Internal Medicine

## 2009-12-18 LAB — CONVERTED CEMR LAB
Basophils Relative: 1 % (ref 0–1)
Hemoglobin: 14.3 g/dL (ref 13.0–17.0)
Lymphocytes Relative: 28 % (ref 12–46)
MCHC: 32.8 g/dL (ref 30.0–36.0)
Monocytes Absolute: 0.6 10*3/uL (ref 0.1–1.0)
Monocytes Relative: 8 % (ref 3–12)
Neutro Abs: 3.6 10*3/uL (ref 1.7–7.7)
RBC: 5.89 M/uL — ABNORMAL HIGH (ref 4.22–5.81)

## 2009-12-23 ENCOUNTER — Ambulatory Visit: Payer: Self-pay | Admitting: Gastroenterology

## 2009-12-23 DIAGNOSIS — K6289 Other specified diseases of anus and rectum: Secondary | ICD-10-CM

## 2009-12-23 DIAGNOSIS — K921 Melena: Secondary | ICD-10-CM | POA: Insufficient documentation

## 2009-12-23 DIAGNOSIS — D721 Eosinophilia: Secondary | ICD-10-CM

## 2009-12-23 DIAGNOSIS — R898 Other abnormal findings in specimens from other organs, systems and tissues: Secondary | ICD-10-CM | POA: Insufficient documentation

## 2009-12-25 ENCOUNTER — Encounter: Payer: Self-pay | Admitting: Gastroenterology

## 2010-01-13 ENCOUNTER — Inpatient Hospital Stay (HOSPITAL_COMMUNITY): Admission: EM | Admit: 2010-01-13 | Discharge: 2010-02-01 | Payer: Self-pay | Admitting: Emergency Medicine

## 2010-01-19 ENCOUNTER — Ambulatory Visit: Payer: Self-pay | Admitting: Thoracic Surgery

## 2010-01-20 ENCOUNTER — Encounter: Payer: Self-pay | Admitting: Thoracic Surgery

## 2010-01-25 ENCOUNTER — Encounter: Payer: Self-pay | Admitting: Thoracic Surgery

## 2010-02-04 LAB — CONVERTED CEMR LAB
Albumin: 4.1 g/dL (ref 3.5–5.2)
CO2: 23 meq/L (ref 19–32)
Chloride: 104 meq/L (ref 96–112)
Glucose, Bld: 162 mg/dL — ABNORMAL HIGH (ref 70–99)
Lipase: 20 units/L (ref 0–75)
Potassium: 5.1 meq/L (ref 3.5–5.3)
Sodium: 140 meq/L (ref 135–145)
Total Protein: 7.3 g/dL (ref 6.0–8.3)

## 2010-02-05 ENCOUNTER — Encounter: Admission: RE | Admit: 2010-02-05 | Discharge: 2010-02-05 | Payer: Self-pay | Admitting: Thoracic Surgery

## 2010-02-05 ENCOUNTER — Ambulatory Visit: Payer: Self-pay | Admitting: Thoracic Surgery

## 2010-03-05 ENCOUNTER — Ambulatory Visit: Payer: Self-pay | Admitting: Thoracic Surgery

## 2010-03-05 ENCOUNTER — Encounter: Admission: RE | Admit: 2010-03-05 | Discharge: 2010-03-05 | Payer: Self-pay | Admitting: Thoracic Surgery

## 2010-05-05 ENCOUNTER — Encounter: Admission: RE | Admit: 2010-05-05 | Discharge: 2010-05-05 | Payer: Self-pay | Admitting: Thoracic Surgery

## 2010-05-05 ENCOUNTER — Ambulatory Visit: Payer: Self-pay | Admitting: Thoracic Surgery

## 2010-05-09 ENCOUNTER — Emergency Department (HOSPITAL_COMMUNITY): Admission: EM | Admit: 2010-05-09 | Discharge: 2010-05-10 | Payer: Self-pay | Admitting: Emergency Medicine

## 2010-08-30 IMAGING — CR DG CHEST 1V PORT
1 series · 1 of 1 positions shown · non-contrast
Comparison: Portable exam 6000 hours compared to 04/02/2009

CLINICAL DATA: Chest pain, shortness of breath

PORTABLE CHEST - 1 VIEW

[view not recorded]
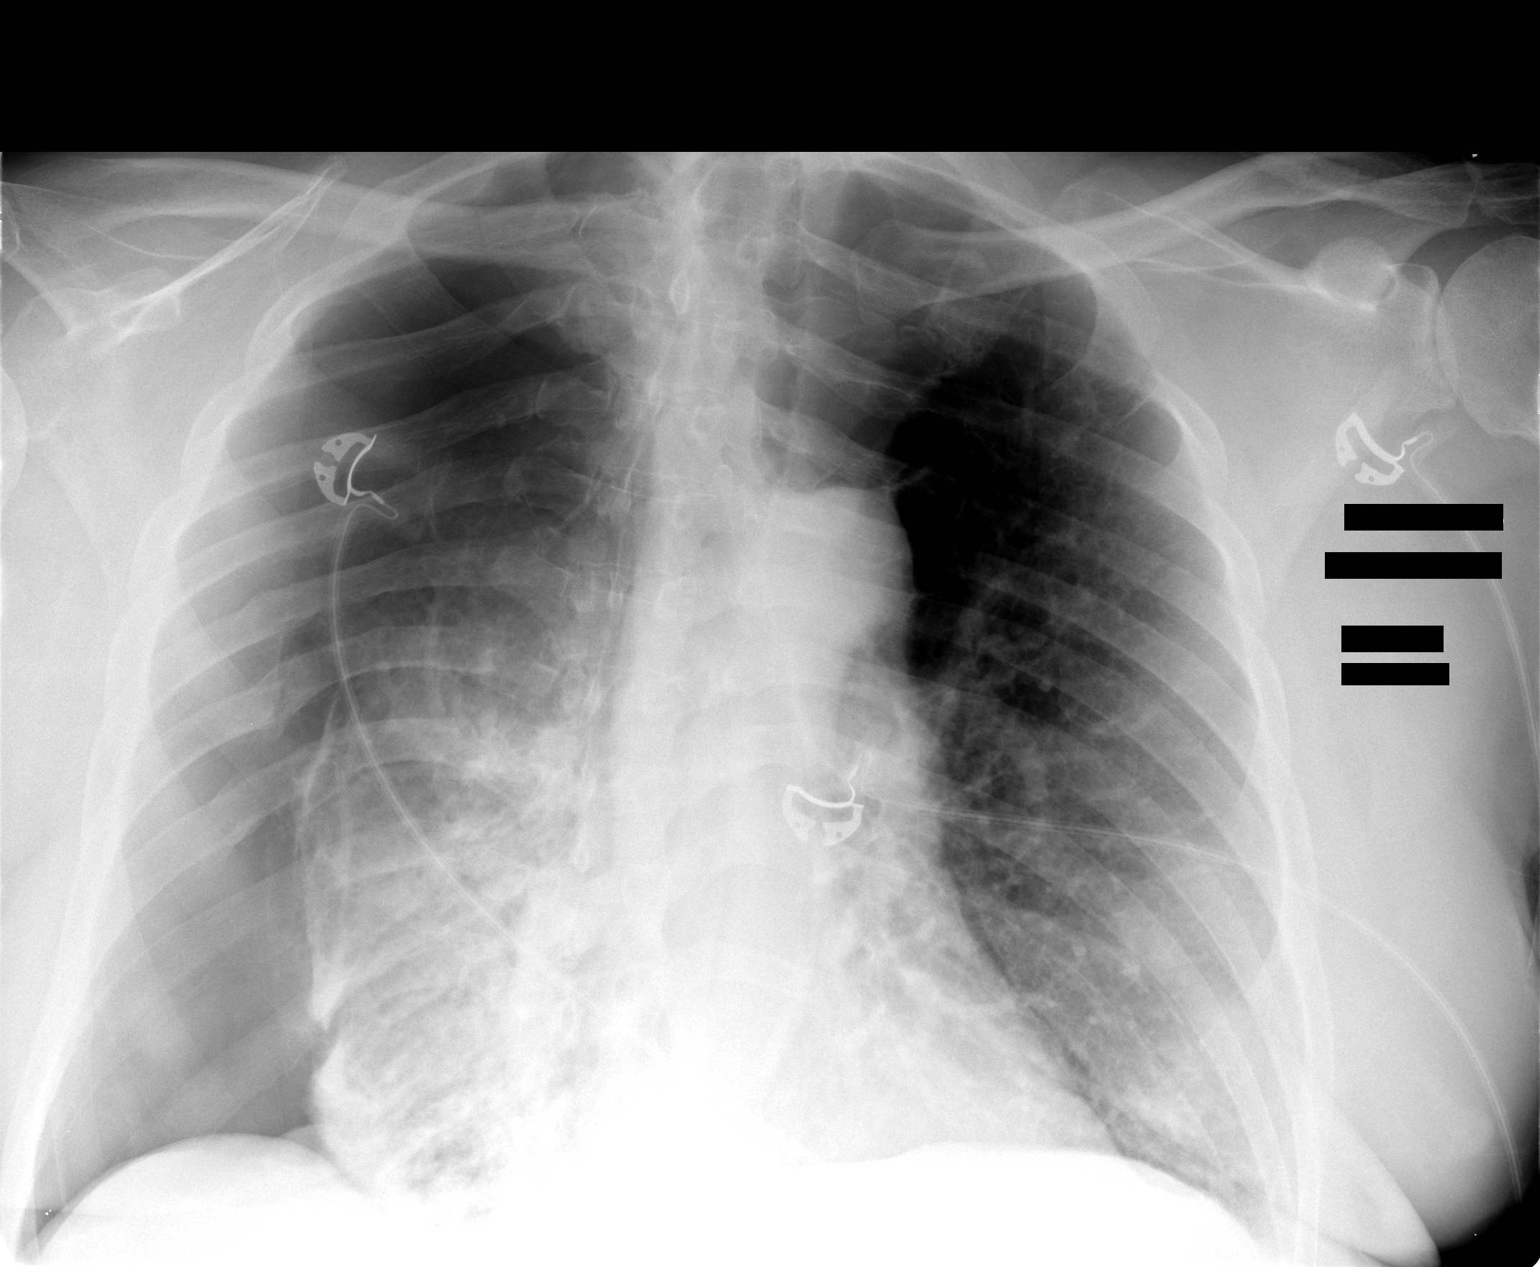

[1 of 1 positions shown; findings below may reference images not displayed]

FINDINGS: Large right pneumothorax with near complete collapse of the right
lung.
No definite mediastinal shift.
Patient rotated to the left.
Heart size normal.
Underlying COPD.
Minimal left basilar atelectasis versus consolidation.
IMPRESSION: Large right pneumothorax with near complete collapse of the right
lung.

Critical test results telephoned to Dr. Sherif at the time of
interpretation on 01/13/2010 at 2220 hours

## 2010-08-30 IMAGING — CR DG CHEST 1V PORT SAME DAY
1 series · 1 of 1 positions shown · non-contrast
Comparison: Portable exam 2754 hours compared to 3333 hours.

CLINICAL DATA: Chest pain, pneumothorax status post chest tube
placement

PORTABLE CHEST - 1 VIEW SAME DAY

[view not recorded]
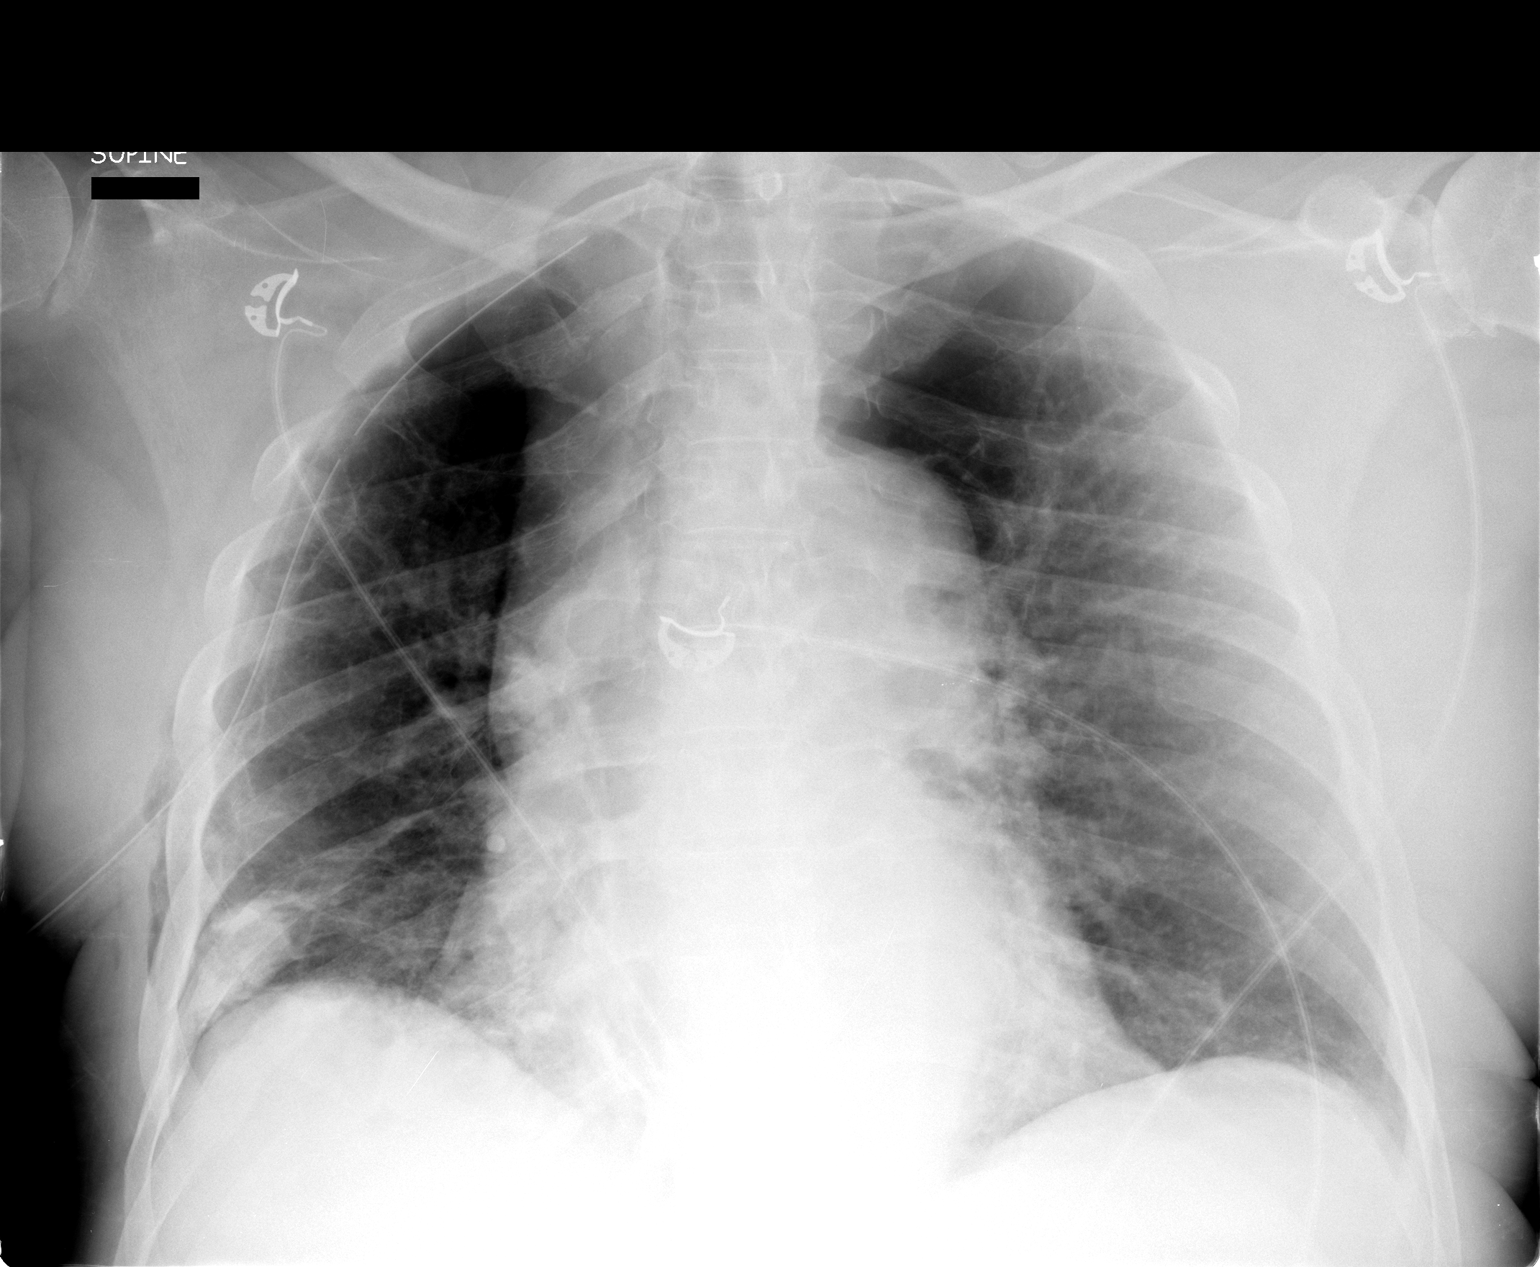

[1 of 1 positions shown; findings below may reference images not displayed]

FINDINGS: Right thoracostomy tube, tip near right apex.
Significant re-expansion of right lung.
Partial atelectasis at right lung base.
No definite residual pneumothorax.
Large bulla at right apex again noted.
Minimal chest wall emphysema.
Left lung remains clear, though bullous changes noted at left apex
as well.
IMPRESSION: Significant re-expansion of right lung post thoracostomy tube
placement.
Underlying COPD with biapical bullous disease.

## 2010-08-31 ENCOUNTER — Ambulatory Visit (HOSPITAL_COMMUNITY): Admission: RE | Admit: 2010-08-31 | Discharge: 2010-08-31 | Payer: Self-pay | Admitting: Internal Medicine

## 2010-08-31 IMAGING — CR DG CHEST 1V PORT
1 series · 1 of 1 positions shown · non-contrast
Comparison: Portable exam 0309 hours compared to 01/13/2010

CLINICAL DATA: Right side pneumothorax status post chest tube
placement, follow-up

PORTABLE CHEST - 1 VIEW

[view not recorded]
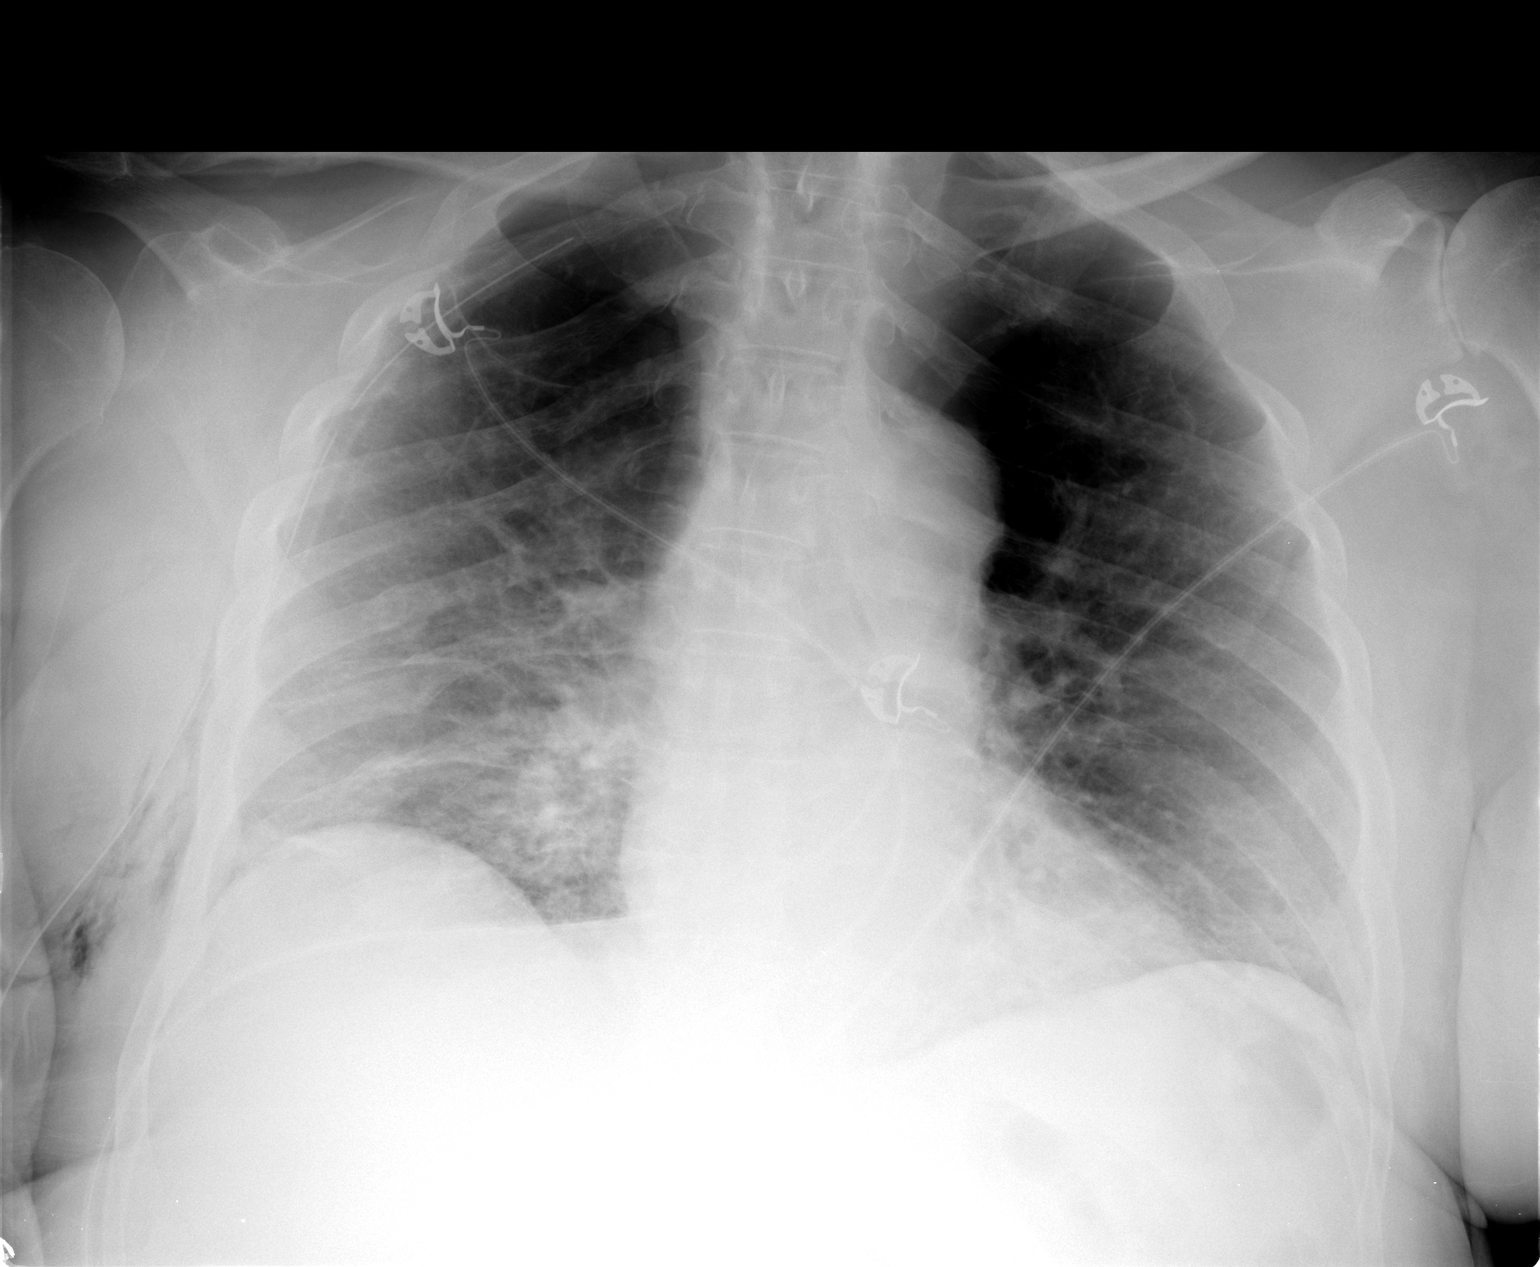

[1 of 1 positions shown; findings below may reference images not displayed]

FINDINGS: Right thoracostomy tube stable.
Stable heart size and mediastinal contours.
Changes of COPD with biapical bullous disease.
Improving atelectasis right lung base.
Remaining lungs clear.
No definite pneumothorax identified.
IMPRESSION: Right thoracostomy tube without pneumothorax.
COPD with biapical bullous disease.
Improving atelectasis right lung base.

## 2010-09-02 IMAGING — CR DG CHEST 1V PORT
1 series · 1 of 1 positions shown · non-contrast
Comparison: [DATE]

CLINICAL DATA: Follow-up pneumothorax.  Chest tube.

PORTABLE CHEST - 1 VIEW

[view not recorded]
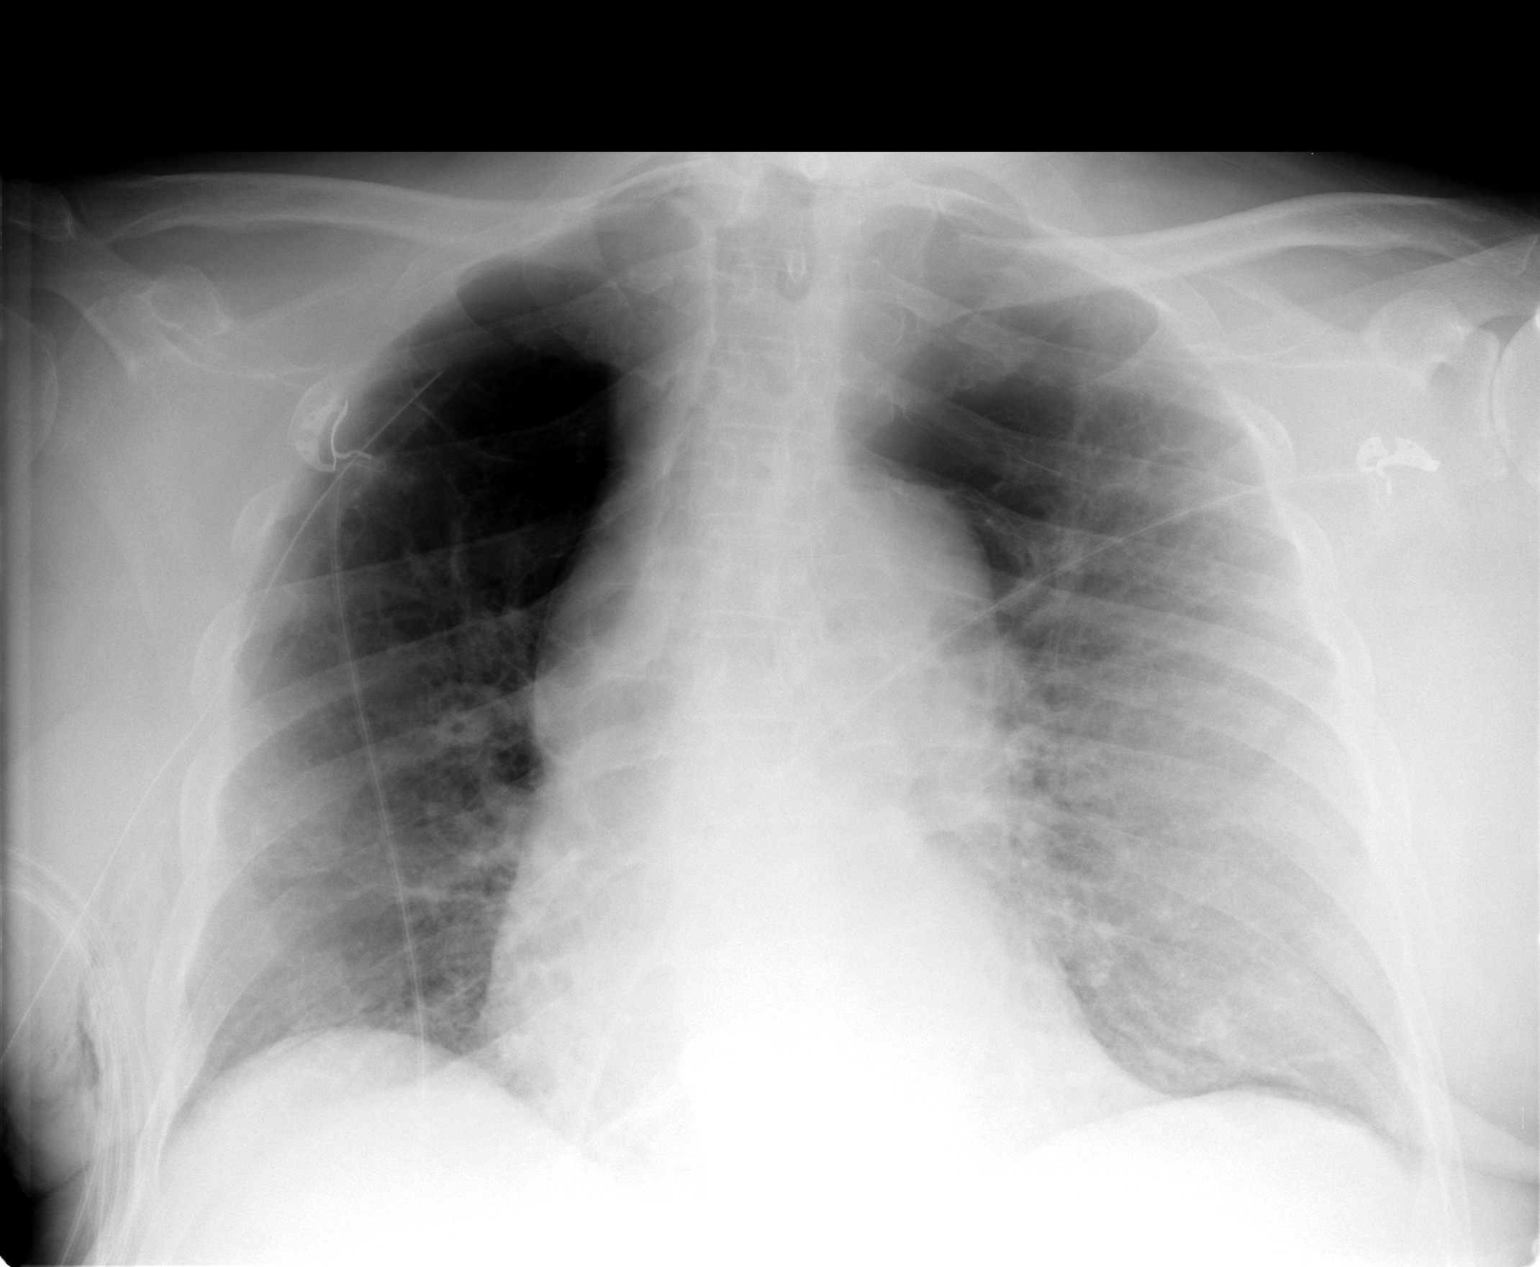

[1 of 1 positions shown; findings below may reference images not displayed]

FINDINGS: Right chest tube remains in place.  Lucency at the apices
persists, right more than left.  I cannot see a discrete pleural
line, in this appearance may relate to emphysema.  There is mild
volume loss at the lung bases.
IMPRESSION: No discernible pleural air.  Lucency at the apices, right more than
left, felt to represent underlying emphysema.  Mild basilar
atelectasis.

## 2010-09-03 IMAGING — CR DG CHEST 1V PORT
1 series · 1 of 1 positions shown · non-contrast
Comparison: 01/16/2010

CLINICAL DATA: Chest tube placement for right pneumothorax.

PORTABLE CHEST - 1 VIEW

[view not recorded]
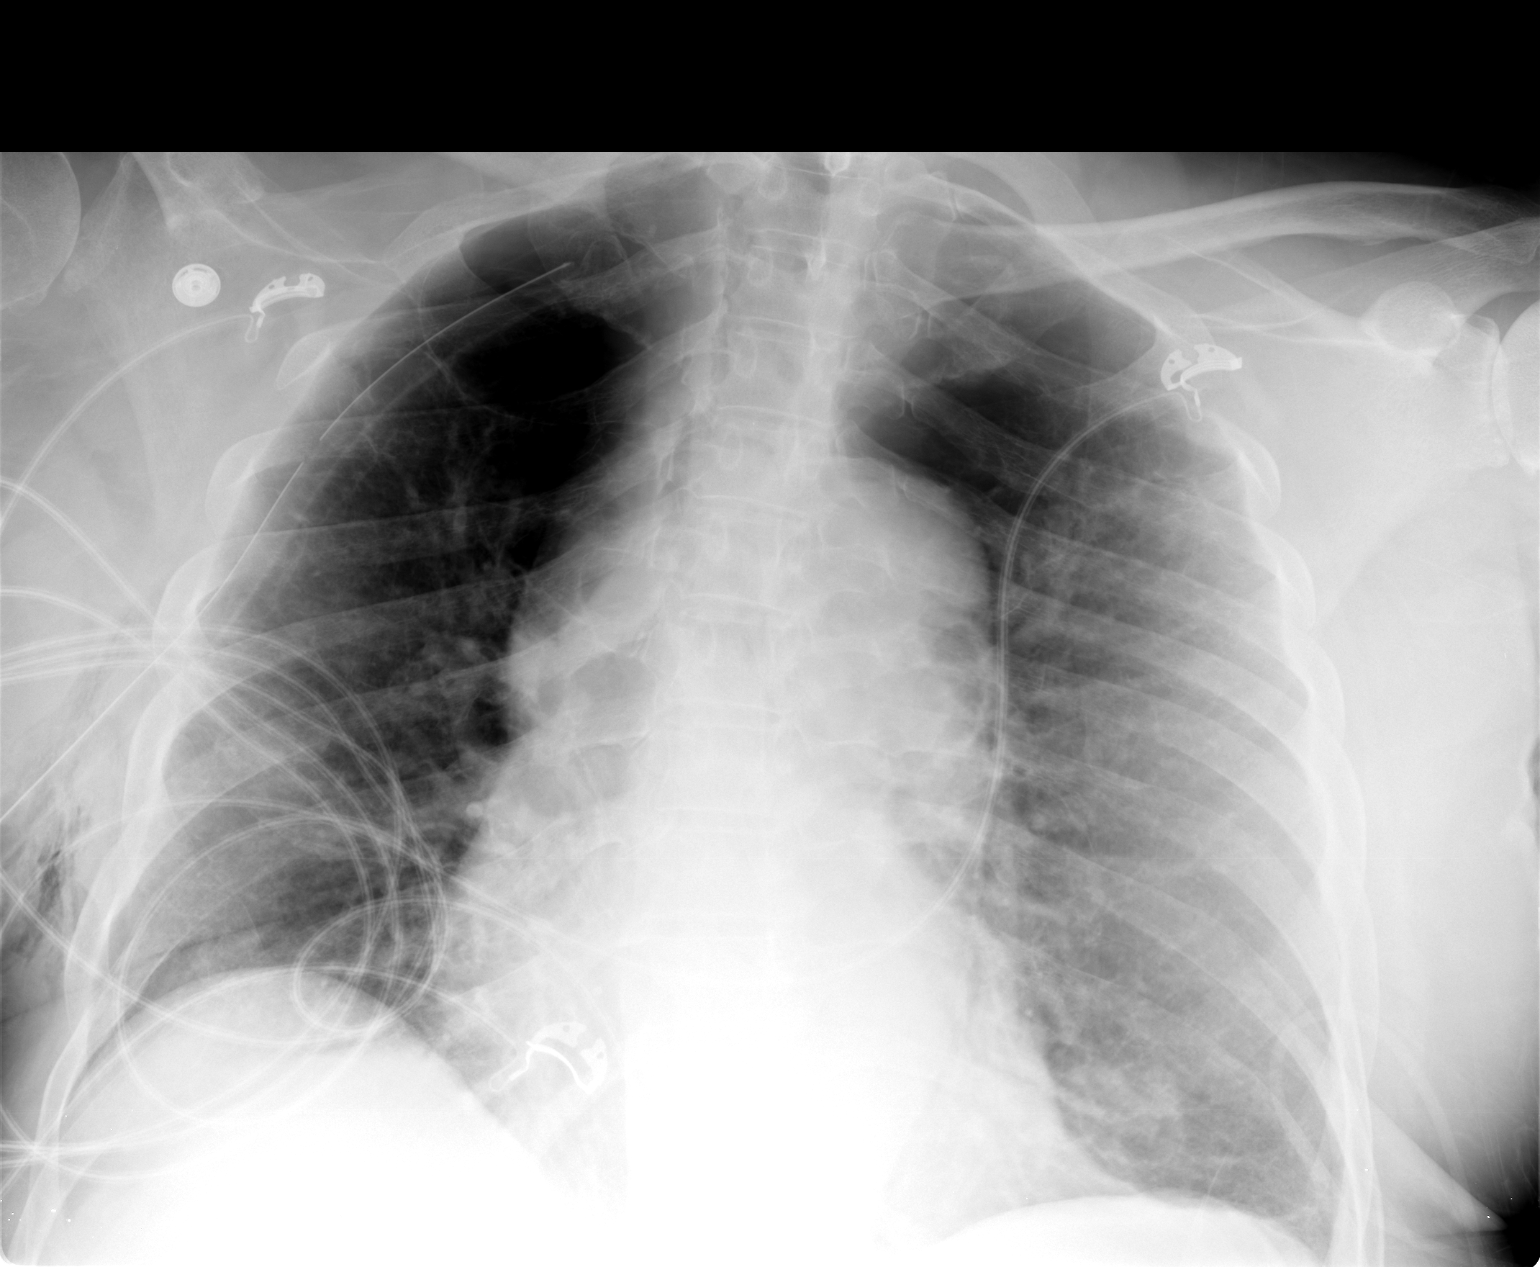

[1 of 1 positions shown; findings below may reference images not displayed]

FINDINGS: Single right chest tube remains.  Apical pneumothorax
visible of approximately 5% volume.  Underlying lungs show stable
COPD.
IMPRESSION: Right apical pneumothorax of approximately 5% volume.

## 2010-09-04 IMAGING — CR DG CHEST 1V PORT
1 series · 1 of 1 positions shown · non-contrast
Comparison: 01/17/2010 and earlier

CLINICAL DATA: Follow-up pneumothorax

PORTABLE CHEST - 1 VIEW

[view not recorded]
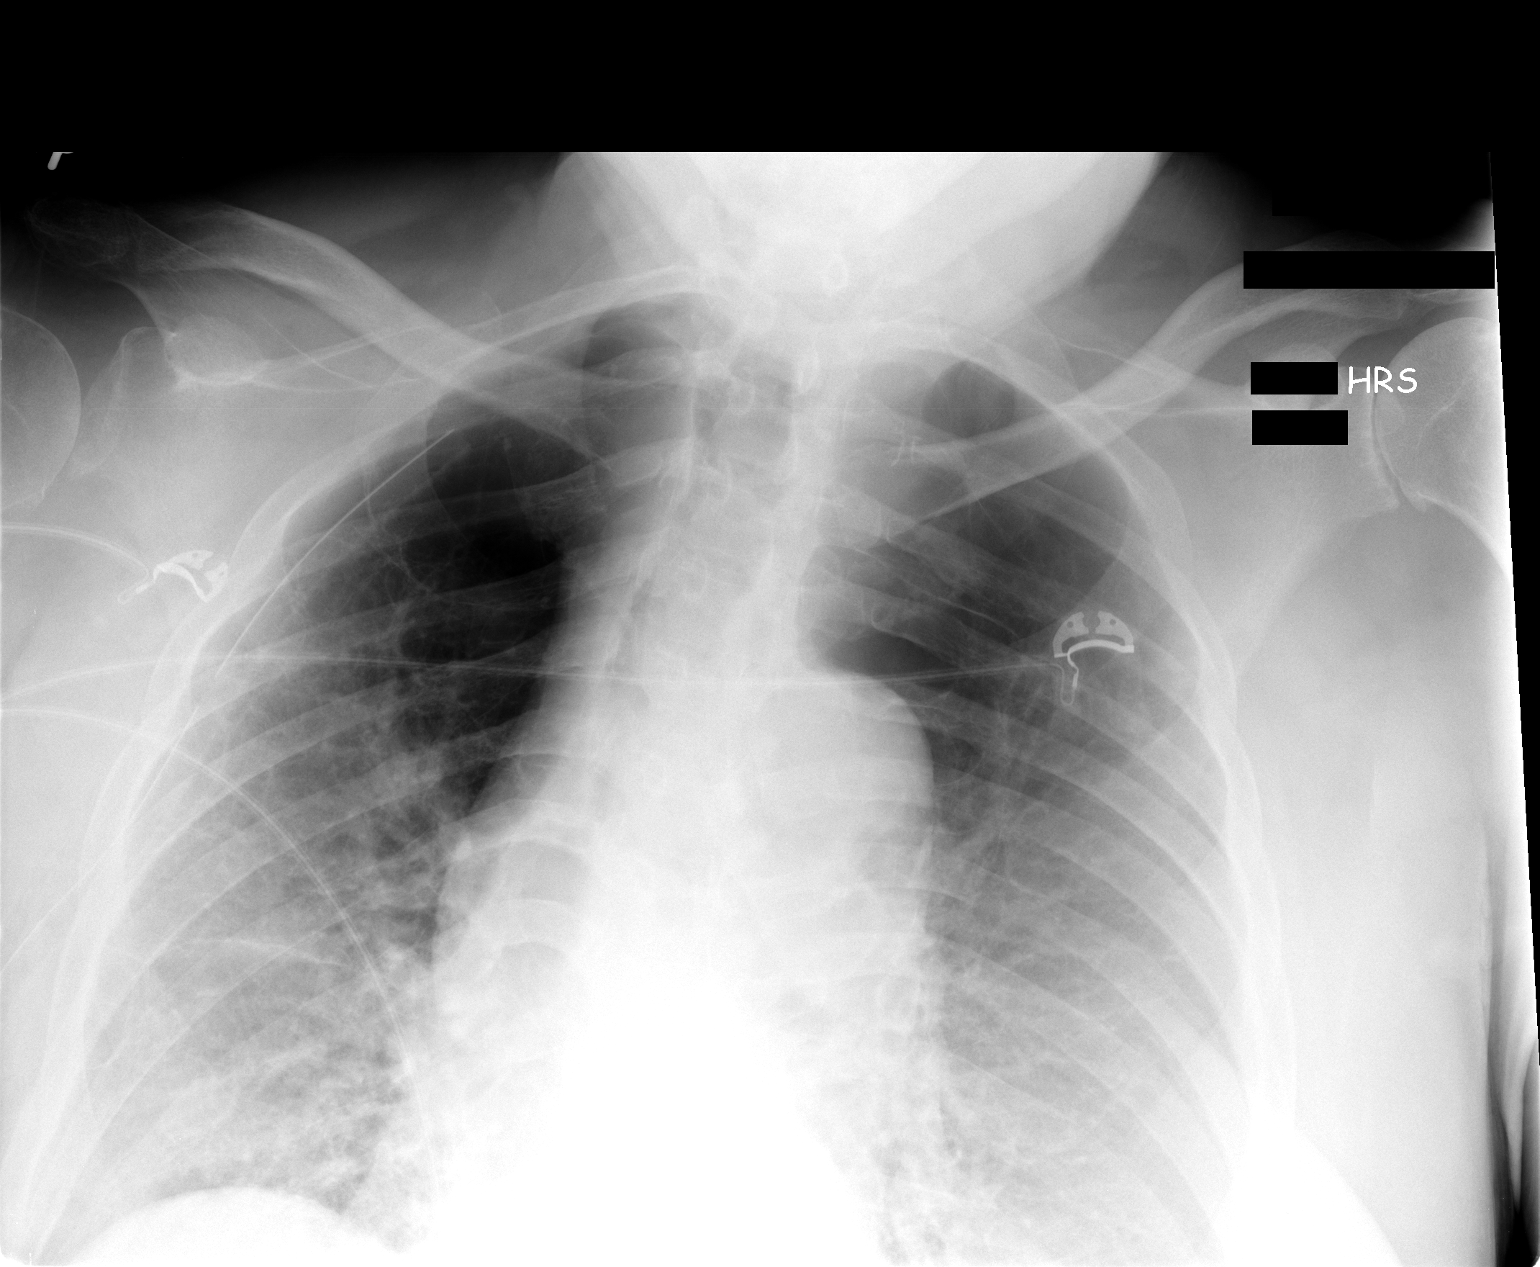

[1 of 1 positions shown; findings below may reference images not displayed]

FINDINGS: Right chest tube remains in place.  The right apical
pneumothorax is smaller but appears to have not completely resolved
at this point.  No definite active lung disease but the lung bases
are incompletely imaged.
IMPRESSION: Right apical pneumothorax smaller but not completely resolved.

## 2010-09-05 IMAGING — CR DG CHEST 1V PORT
1 series · 1 of 1 positions shown · non-contrast
Comparison: Portable chest x-ray of 01/18/2010

CLINICAL DATA: Pneumothorax, follow-up

PORTABLE CHEST - 1 VIEW

[view not recorded]
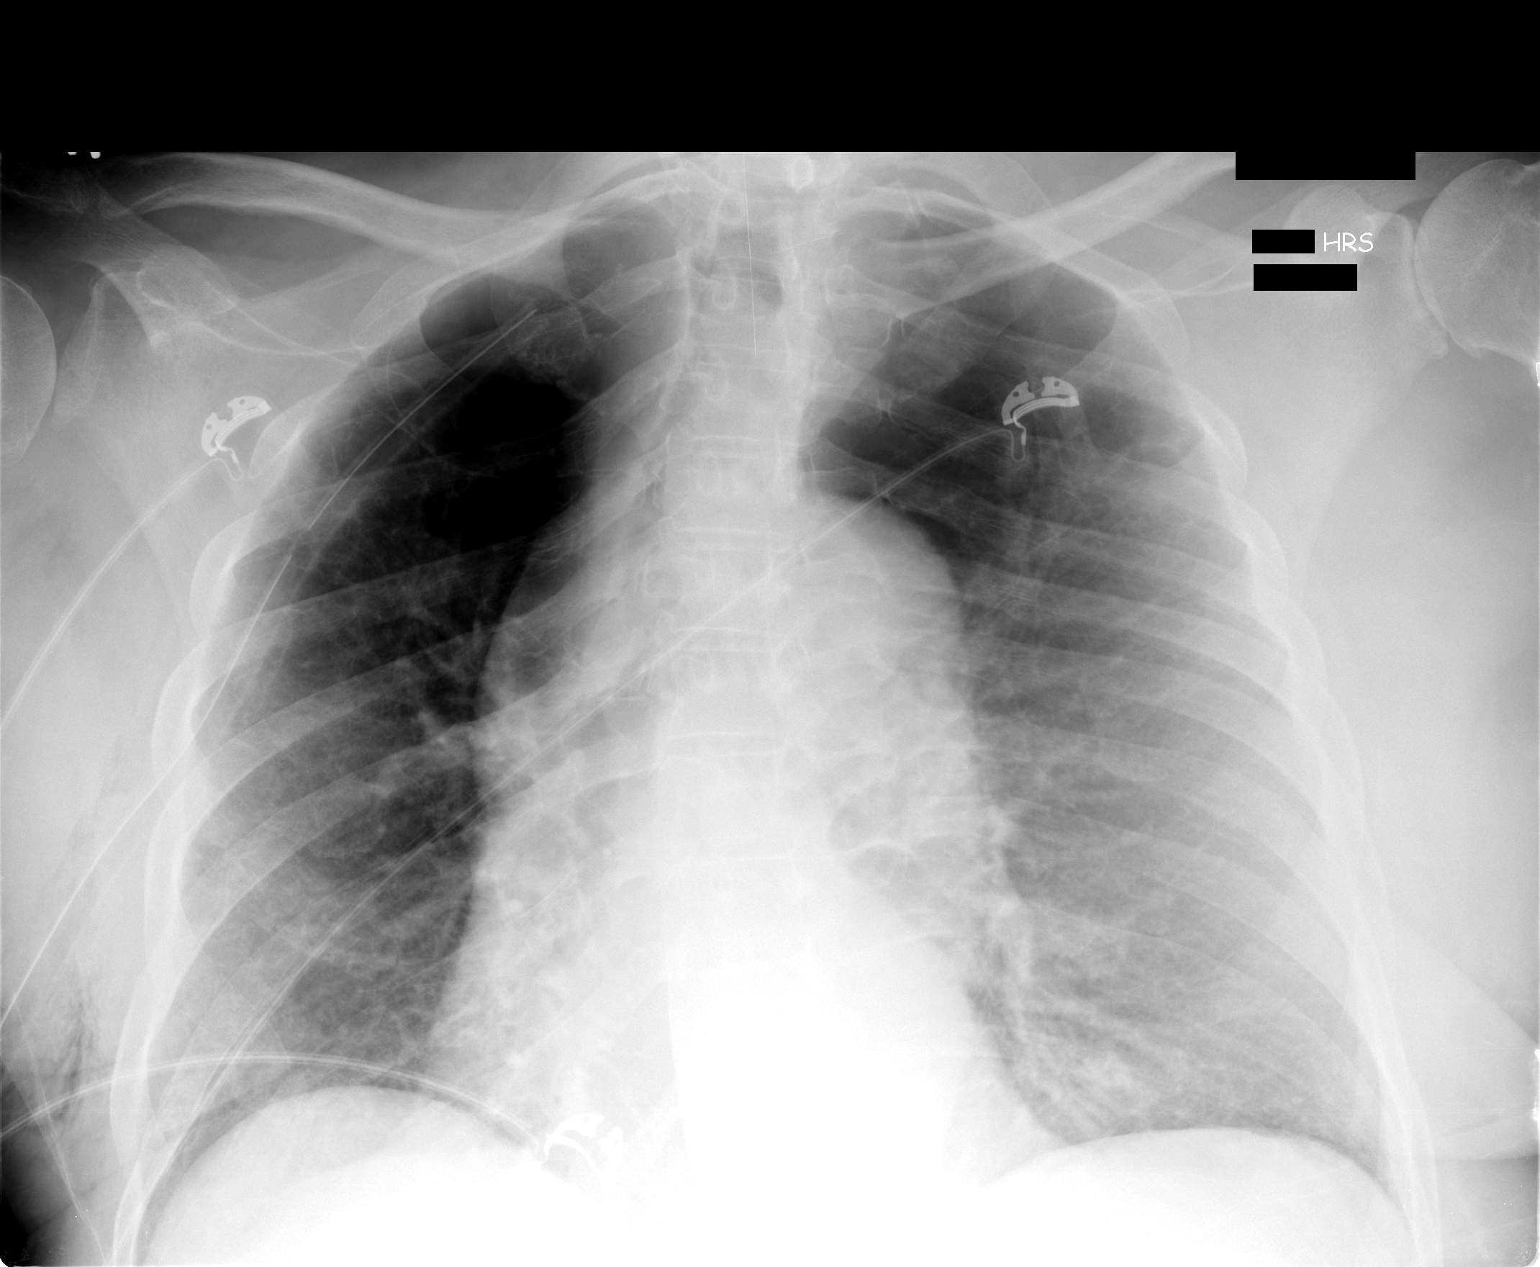

[1 of 1 positions shown; findings below may reference images not displayed]

FINDINGS: There is a large bleb in the right  lung apex and right
chest tube is present.  The previous noted right apical
pneumothorax is not seen with certainty.  The left lung is clear.
Heart size is stable.
IMPRESSION: Right chest tube remains.  No definite right apical pneumothorax
with right apical bleb noted.

## 2010-09-05 IMAGING — CT CT CHEST W/ CM
2 of 4 series · 15 of 36 positions shown, 18 images · IV contrast (APPLIED)
Comparison: None

CLINICAL DATA: Right-sided chest pain and shortness of breath.
History of pneumothorax.

CT CHEST WITH CONTRAST
TECHNIQUE: Multidetector CT imaging of the chest was performed
following the standard protocol during bolus administration of
intravenous contrast.
Contrast: 75 ml [id number]

[Series 2: routine chest 5.0 st · axial · 0.78mm/px · z∈[-39,+236]mm · 12 of 64 slices shown, 15 images]
[im 6/64  mediastinal]
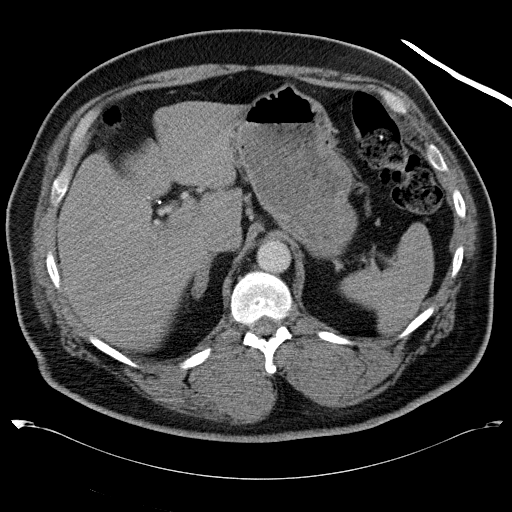
[im 6/64  lung]
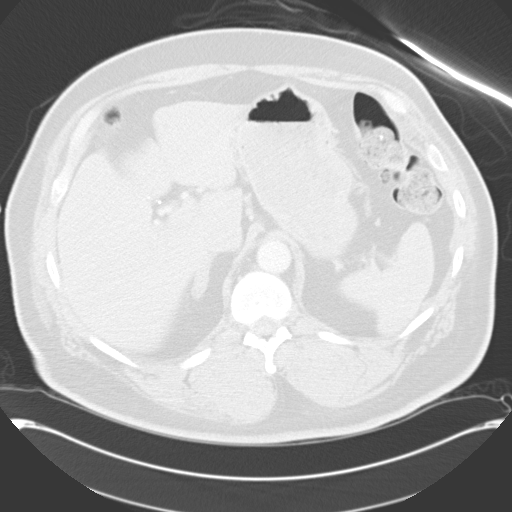
[im 11/64  lung]
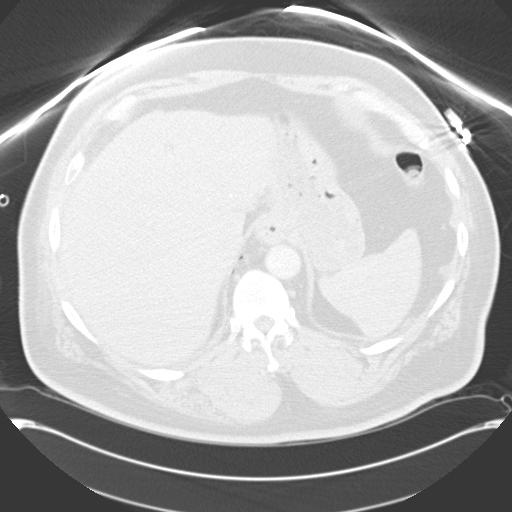
[im 16/64  lung]
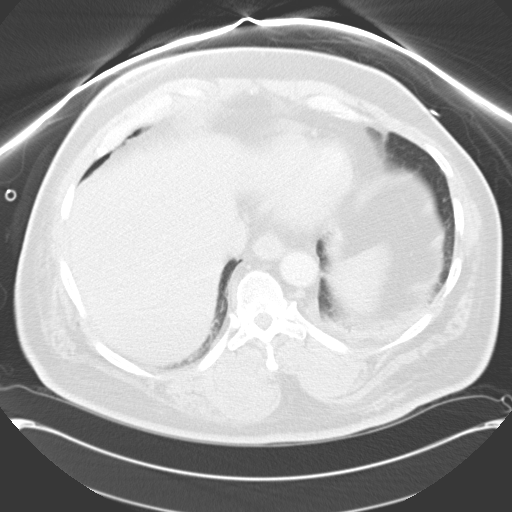
[im 21/64  lung]
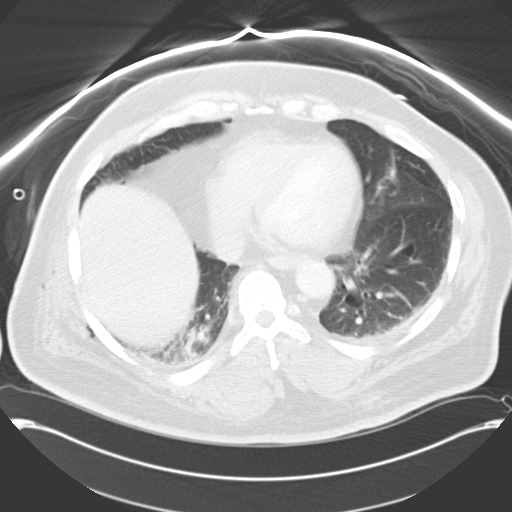
[im 26/64  mediastinal]
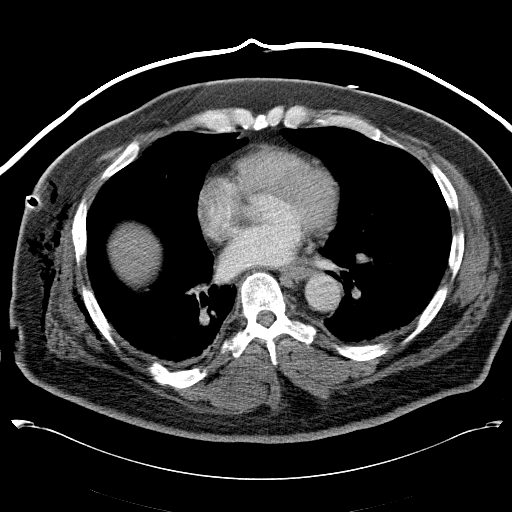
[im 26/64  lung]
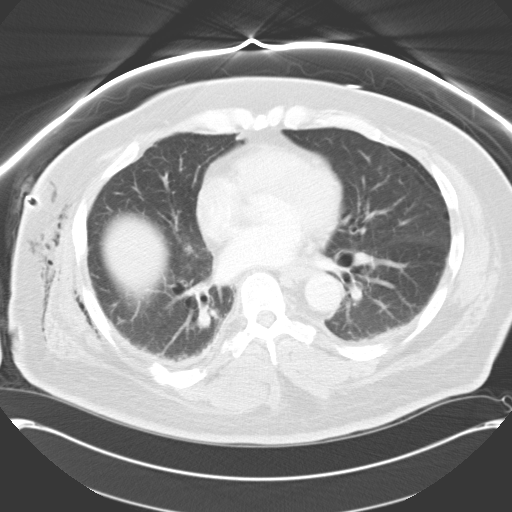
[im 31/64  lung]
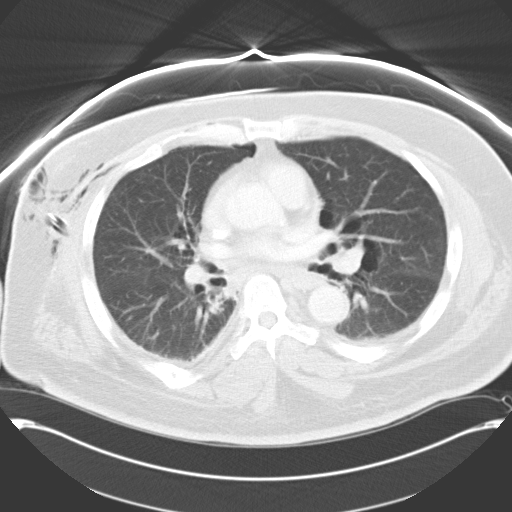
[im 36/64  lung]
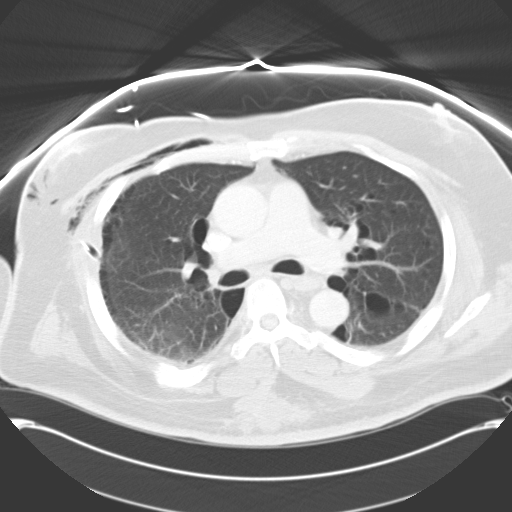
[im 41/64  lung]
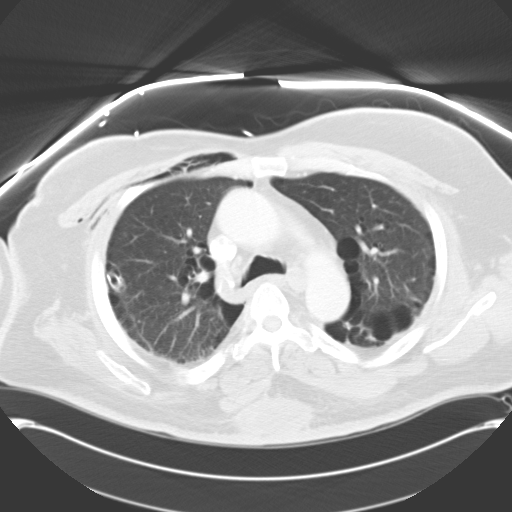
[im 46/64  mediastinal]
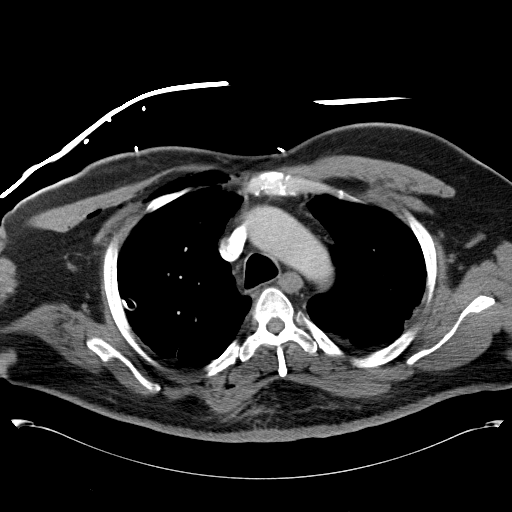
[im 46/64  lung]
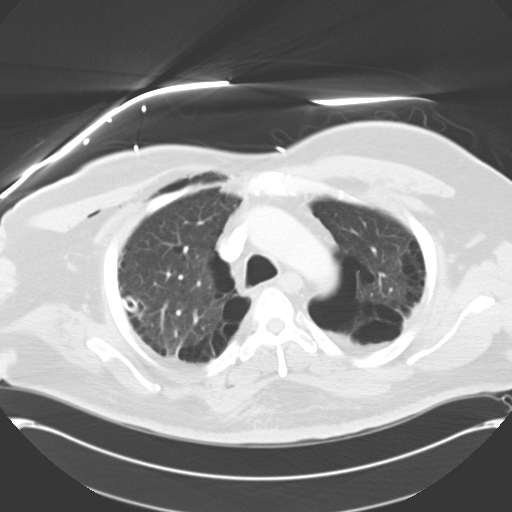
[im 51/64  lung]
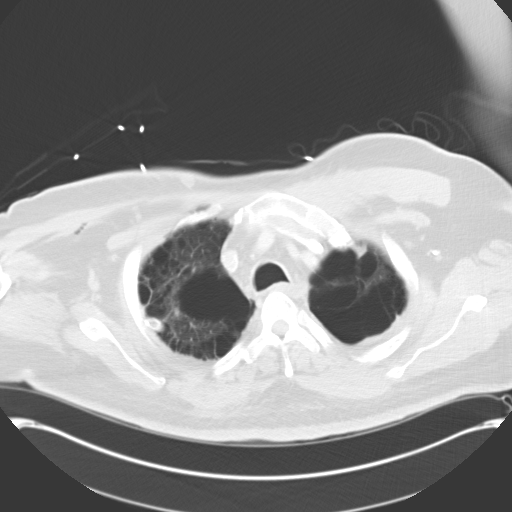
[im 56/64  lung]
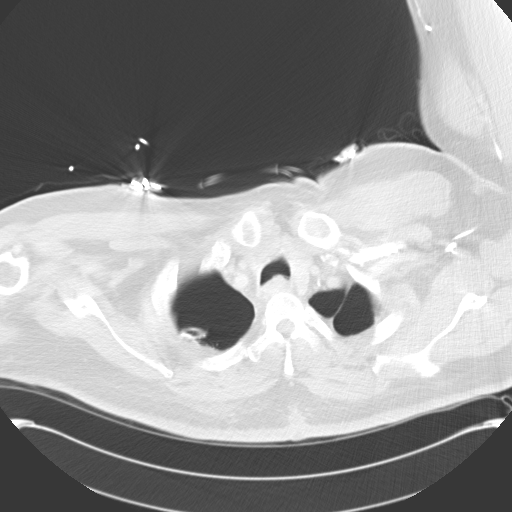
[im 61/64  lung]
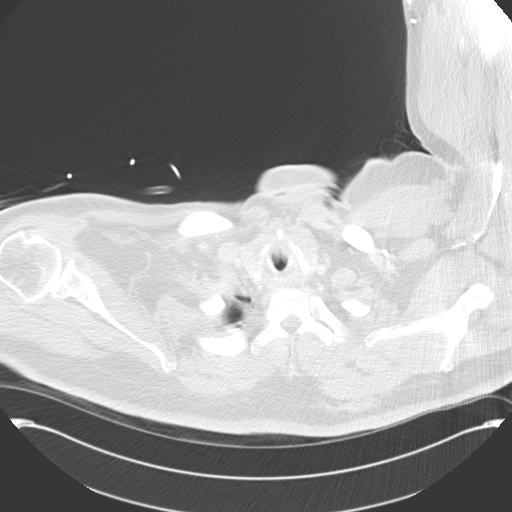

[Series 6: routine chest 3.0 st · coronal · 0.73mm/px · 3 of 86 slices shown]
[im 18/86  lung]
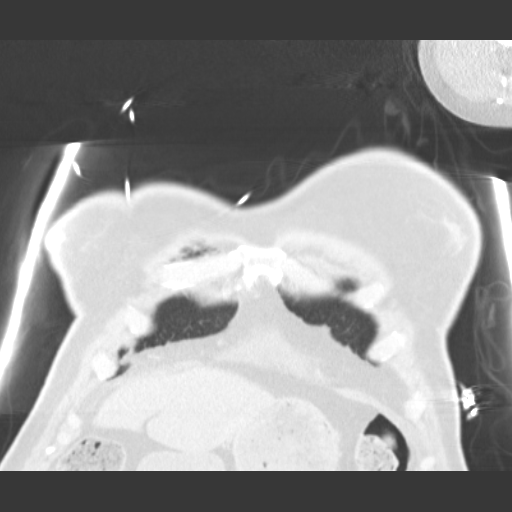
[im 35/86  lung]
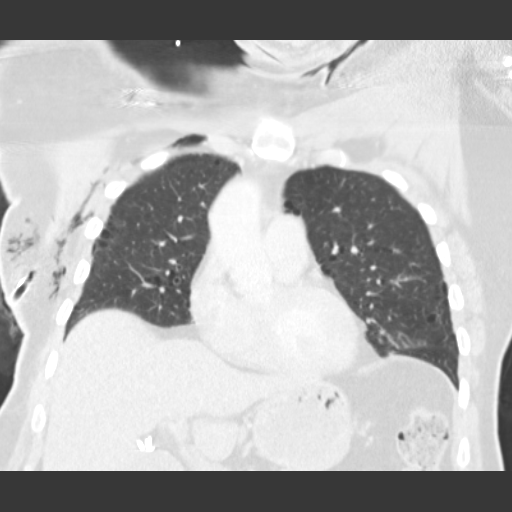
[im 52/86  lung]
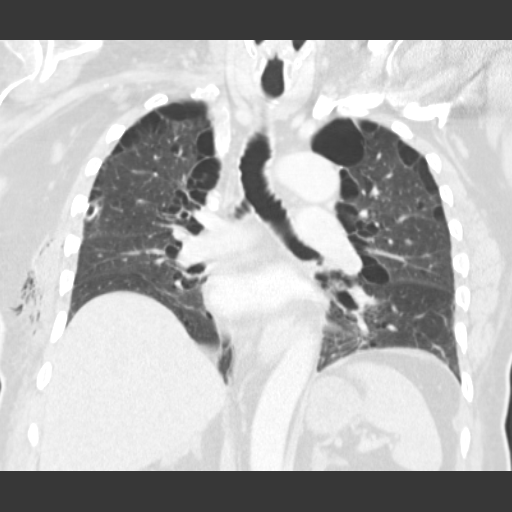

[15 of 36 positions shown; findings below may reference images not displayed]

FINDINGS: There is a right-sided chest tube in place.  No definite
pneumothorax.  There is moderate subcutaneous emphysema noted.
There is extensive bullous changes/COPD in the upper lung zones
bilaterally.  Left lower lobe bronchiectasis is also noted.  No
worrisome pulmonary mass lesions or metastatic nodules.  No pleural
effusions.  Bibasilar atelectasis is noted.

The heart is normal in size.  No pericardial effusion.  There is a
small amount of pericardial fluid.  No mediastinal or hilar
adenopathy.  The aorta is normal in caliber.  No dissection.  The
esophagus is grossly normal.

There is a multi septated cystic appearing lesion in the upper
thorax in the midline.  This may represent a large complex
sebaceous cyst.  Maximal measurement is 6.6 cm.

The bony thorax is intact.  There is mild reversal of the normal
thoracic kyphosis.

The upper abdomen is grossly normal.
IMPRESSION: 1.  Right-sided chest tube in place with no definite pneumothorax.
Right-sided subcutaneous emphysema.
2.  Bullous emphysema.
3.  Coronary artery calcifications.
4.  Bibasilar atelectasis.
5.  Left lower lobe bronchiectasis.

## 2010-09-06 IMAGING — CR DG CHEST 1V PORT
1 series · 1 of 1 positions shown · non-contrast
Comparison: Yesterday

CLINICAL DATA: Pneumothorax

PORTABLE CHEST - 1 VIEW

[view not recorded]
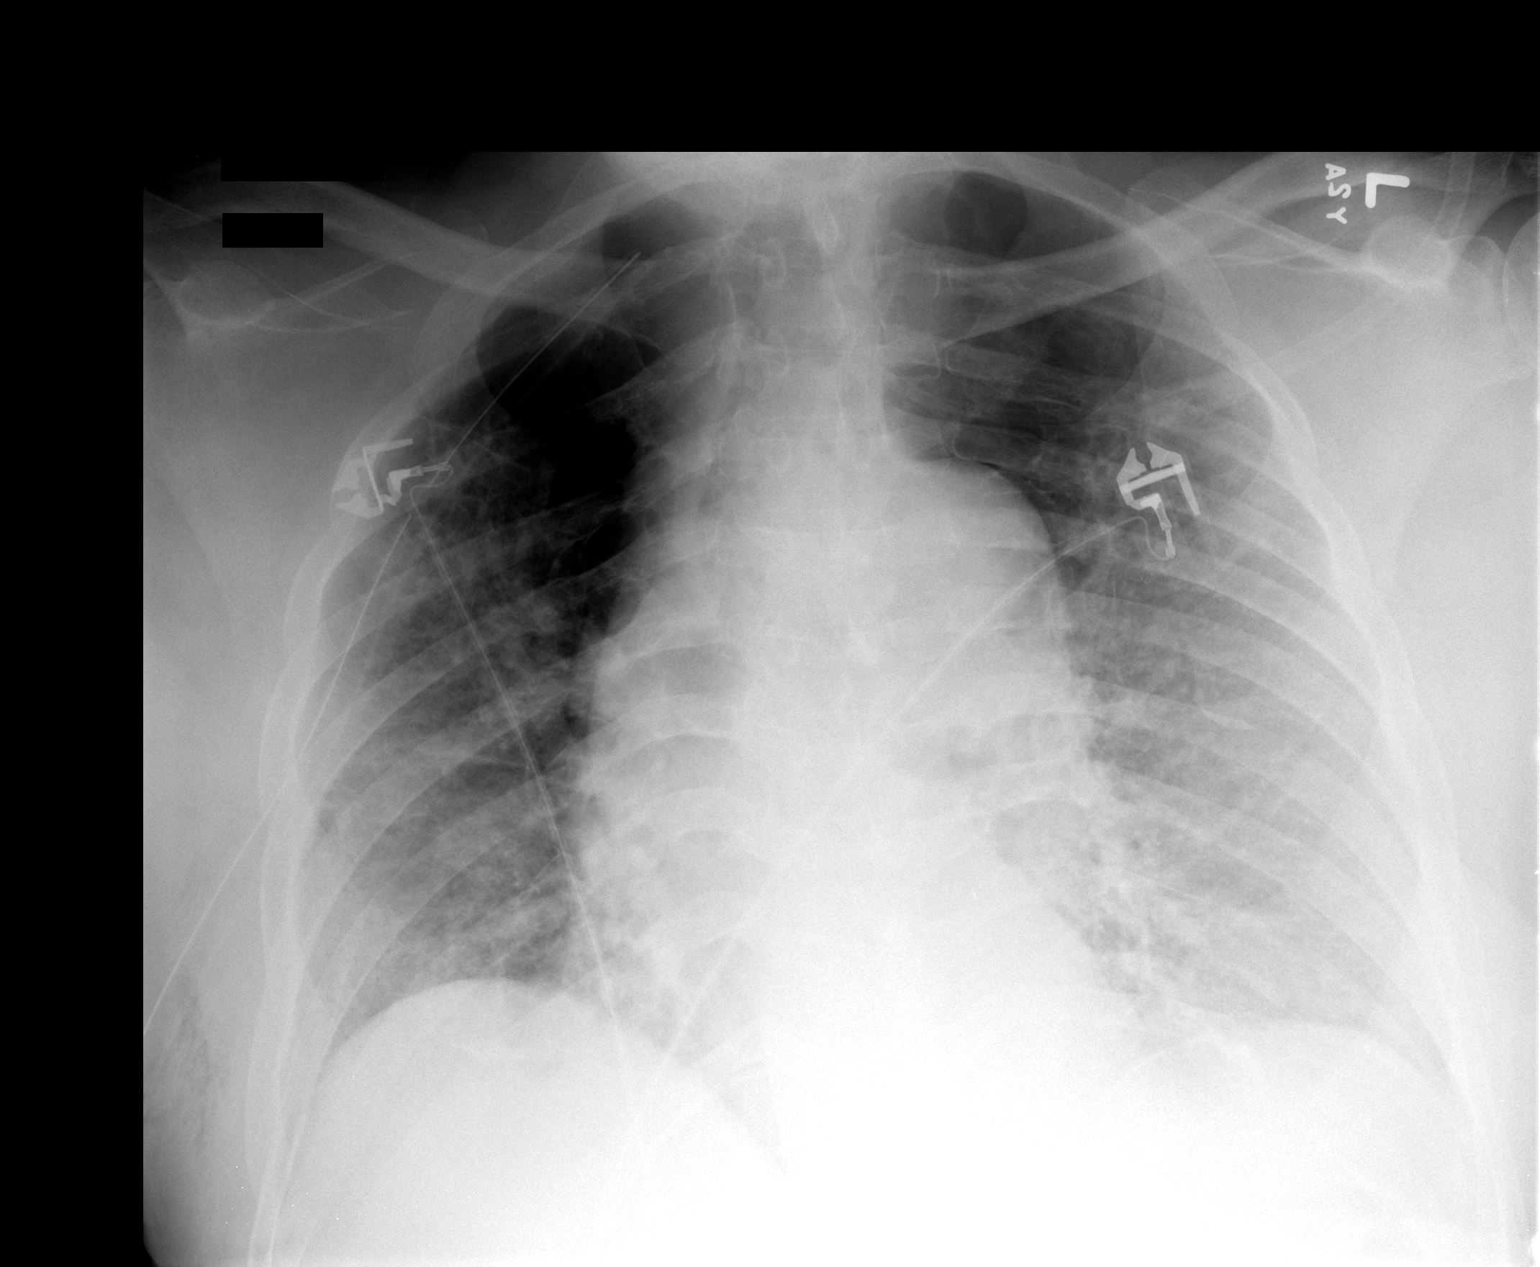

[1 of 1 positions shown; findings below may reference images not displayed]

FINDINGS: The right chest tube is stable.  Extensive bullous
emphysema at the lung apices is redemonstrated but there is no
definite pneumothorax.  The heart is normal in size.  Stable
bibasilar atelectasis.
IMPRESSION: No pneumothorax and no significant interval change.

## 2010-09-07 IMAGING — CR DG CHEST 1V PORT
1 series · 1 of 1 positions shown · non-contrast
Comparison: Portable exam 0823 hours compared to 01/20/2010

CLINICAL DATA: Pneumothorax, chest tube

PORTABLE CHEST - 1 VIEW

[view not recorded]
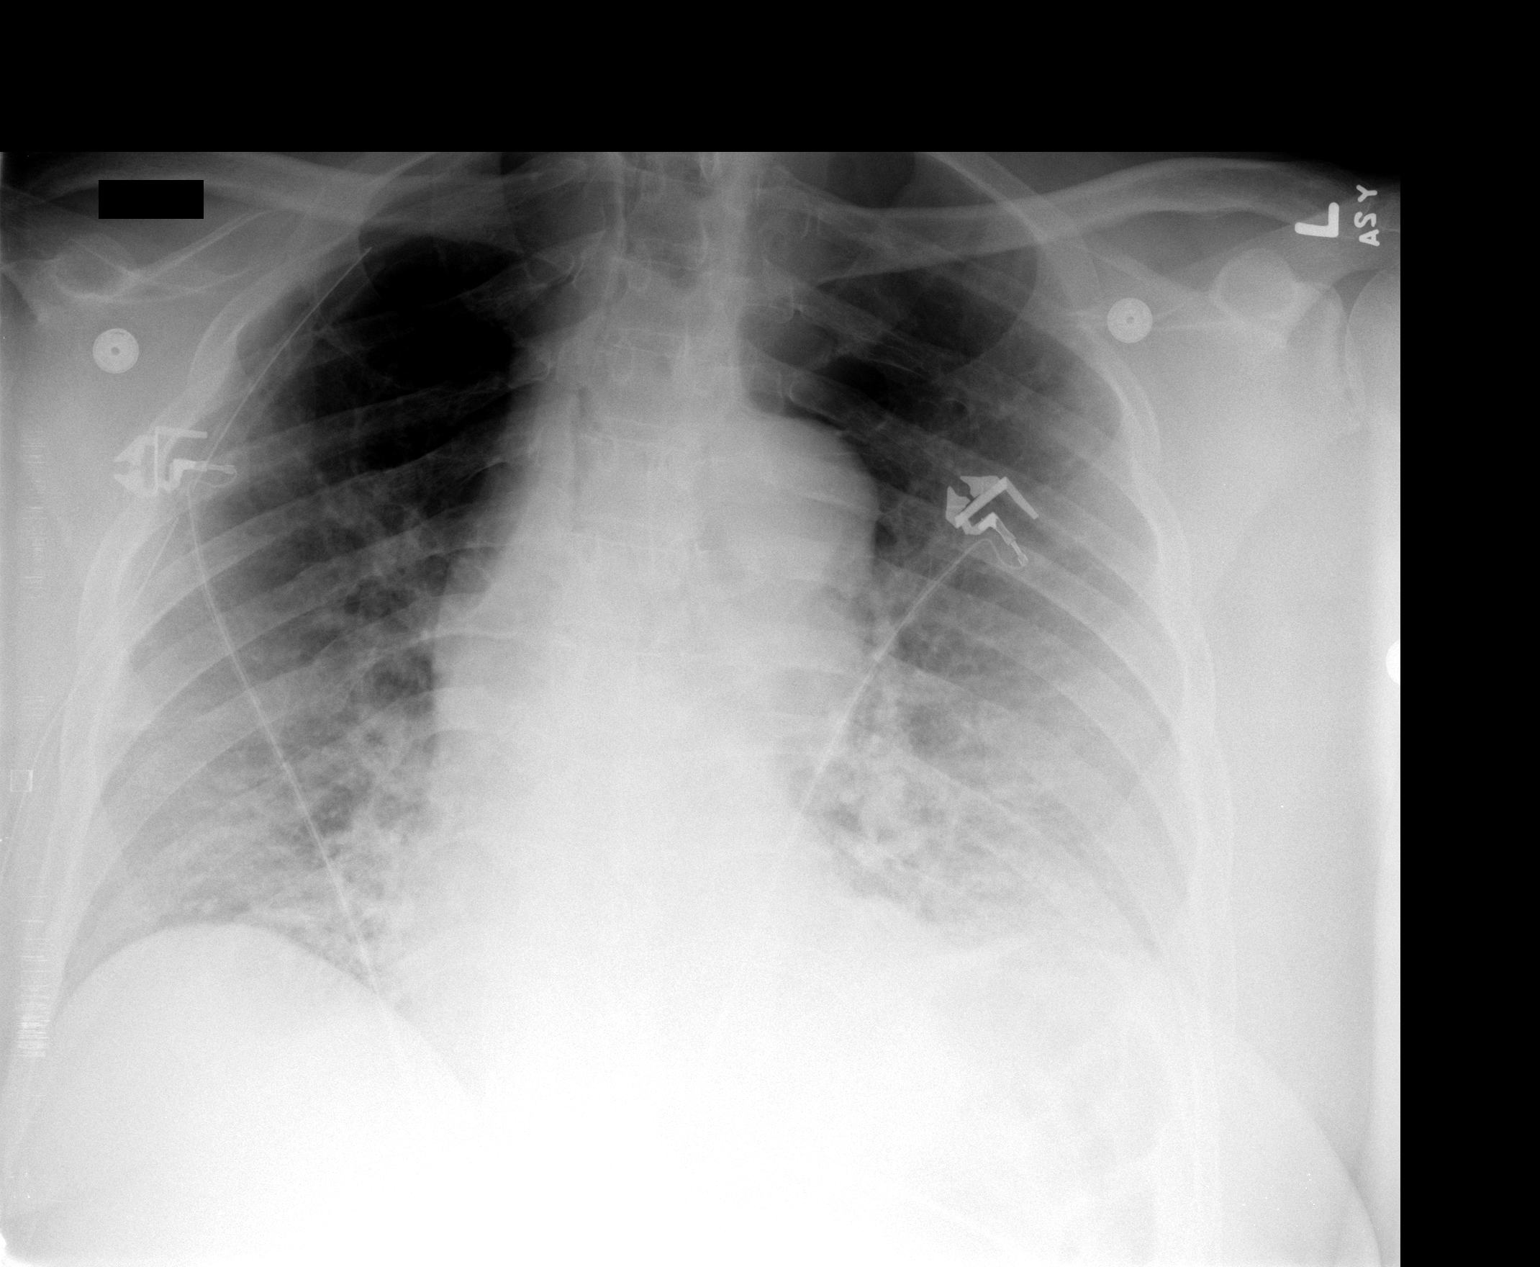

[1 of 1 positions shown; findings below may reference images not displayed]

FINDINGS: Right thoracostomy tube unchanged.
Normal heart size and pulmonary vascularity.
Tortuous thoracic aorta.
Bullous changes at both apices.
Tiny right apex pneumothorax.
Bibasilar atelectasis.
No definite pleural effusion.
IMPRESSION: Right thoracostomy tube with tiny right apex pneumothorax.
Bibasilar atelectasis.

## 2010-09-08 IMAGING — CR DG CHEST 1V PORT
1 series · 1 of 1 positions shown · non-contrast
Comparison: 01/21/2010

CLINICAL DATA: Pneumothorax, right chest tube

PORTABLE CHEST - 1 VIEW

[view not recorded]
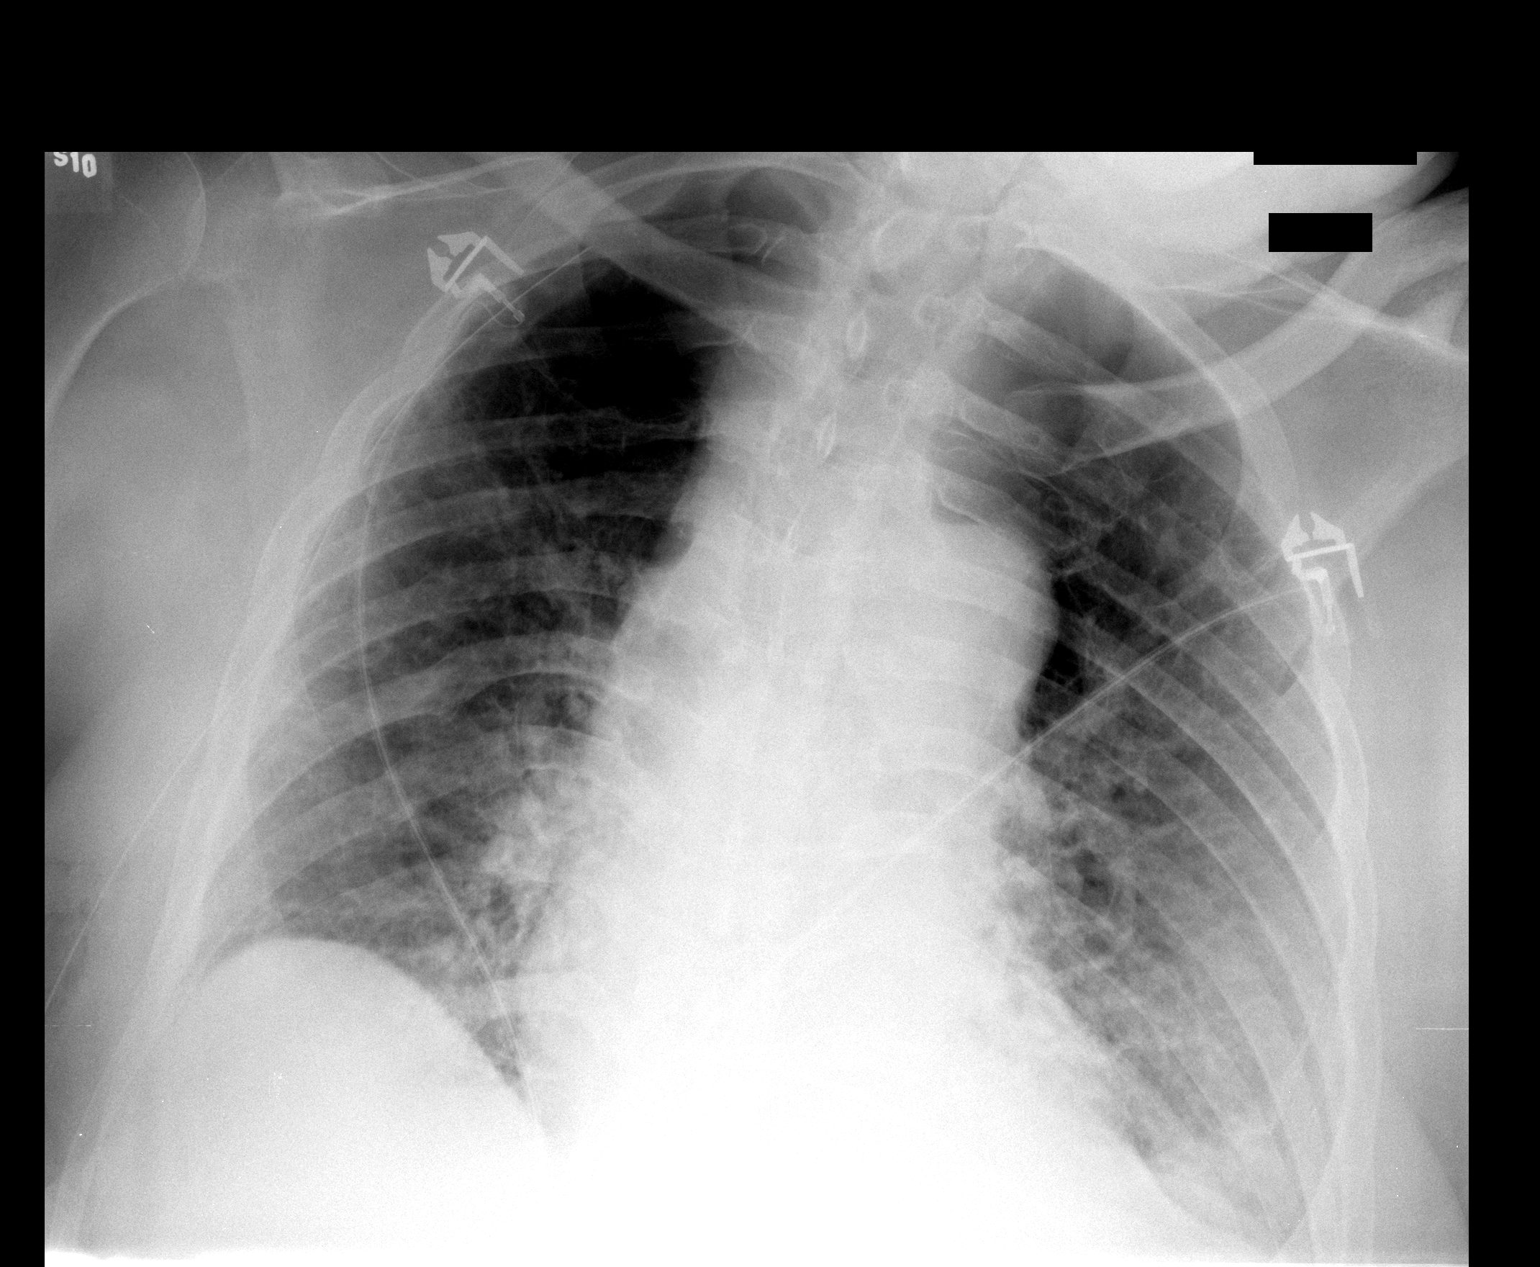

[1 of 1 positions shown; findings below may reference images not displayed]

FINDINGS: Bilateral apical lucencies noted, worse on the right
suspicious for subpleural bullous disease.  No large pneumothorax
demonstrated.  Prominent heart size with vascular congestion and
basilar atelectasis.  Remote right healed rib fractures.  Exam is
overall stable.
IMPRESSION: Stable right chest tube
Biapical subpleural bullous disease without definite pneumothorax
Basilar atelectasis

## 2010-09-09 IMAGING — CR DG CHEST 2V
1 series · 1 of 1 positions shown · non-contrast
Comparison: Portable chest x-ray of 01/22/2010

CLINICAL DATA: Pneumothorax, chest tube placed 1 week ago, cough

CHEST - 2 VIEW

[view not recorded]
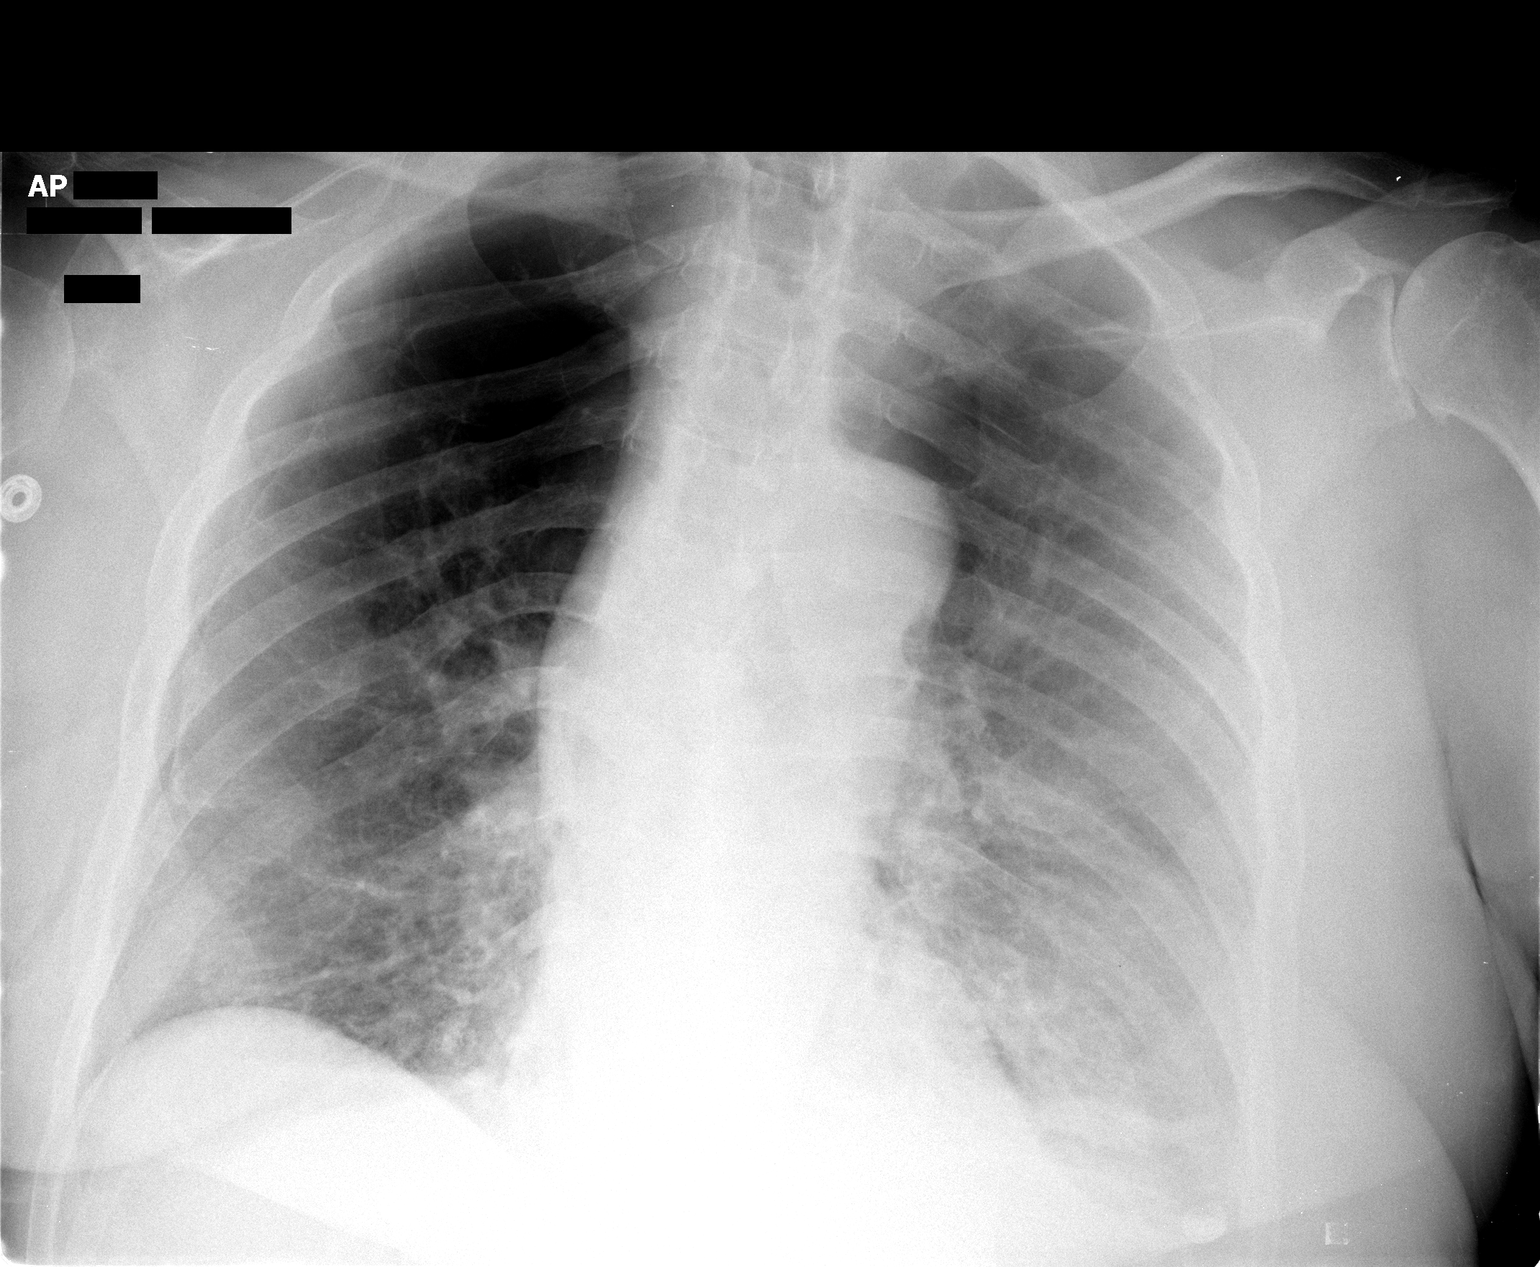

[1 of 1 positions shown; findings below may reference images not displayed]

FINDINGS: The right chest tube is no longer seen.  There has been
an increase in volume of the right pneumothorax now between 15 and
20%.  Basilar atelectasis remains.  Heart size is stable.
IMPRESSION: Increase in right pneumothorax now approximately 15-20%.

## 2010-09-10 IMAGING — CR DG CHEST 1V PORT
1 series · 1 of 1 positions shown · non-contrast
Comparison: Chest x-ray of 01/24/2010

CLINICAL DATA: Right chest tube insertion

PORTABLE CHEST - 1 VIEW

[view not recorded]
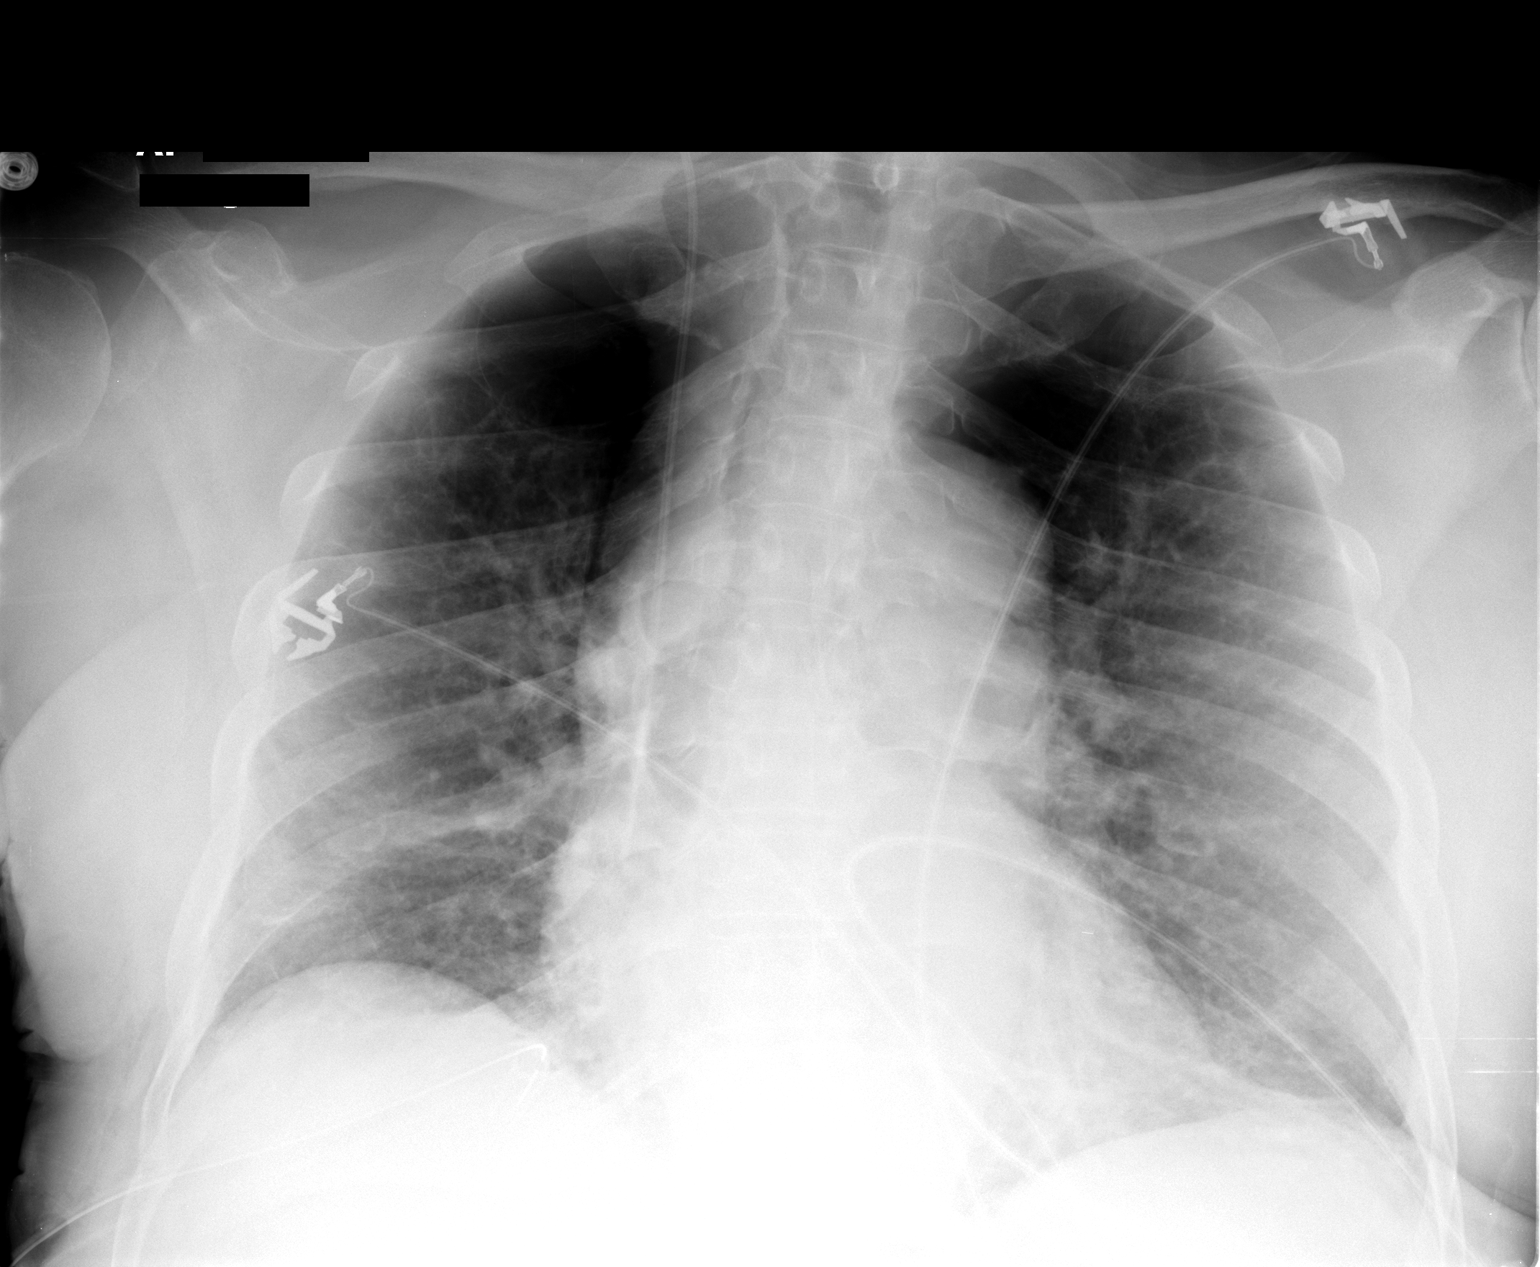

[1 of 1 positions shown; findings below may reference images not displayed]

FINDINGS: A right chest tube has been inserted with the tip
overlying the right lung base.  There has been resolution of the
right pneumothorax with probable bleb remaining in the right upper
lobe but.  The left lung is clear.  Right IJ central venous
catheter is present with the tip near the expected SVC - right
atrial junction.  Mild cardiomegaly is stable.
IMPRESSION: 1.  Right chest of inserted with resolution of right pneumothorax.
No change in large right apical bleb.
2.  Right IJ central venous catheter tip near expected right SVC/RA
junction.

## 2010-09-10 IMAGING — CR DG CHEST 2V
1 series · 1 of 1 positions shown · non-contrast
Comparison: Chest x-ray of 01/23/2010

CLINICAL DATA: Follow up pneumothorax, left arm weakness

CHEST - 2 VIEW

[w chest lat]
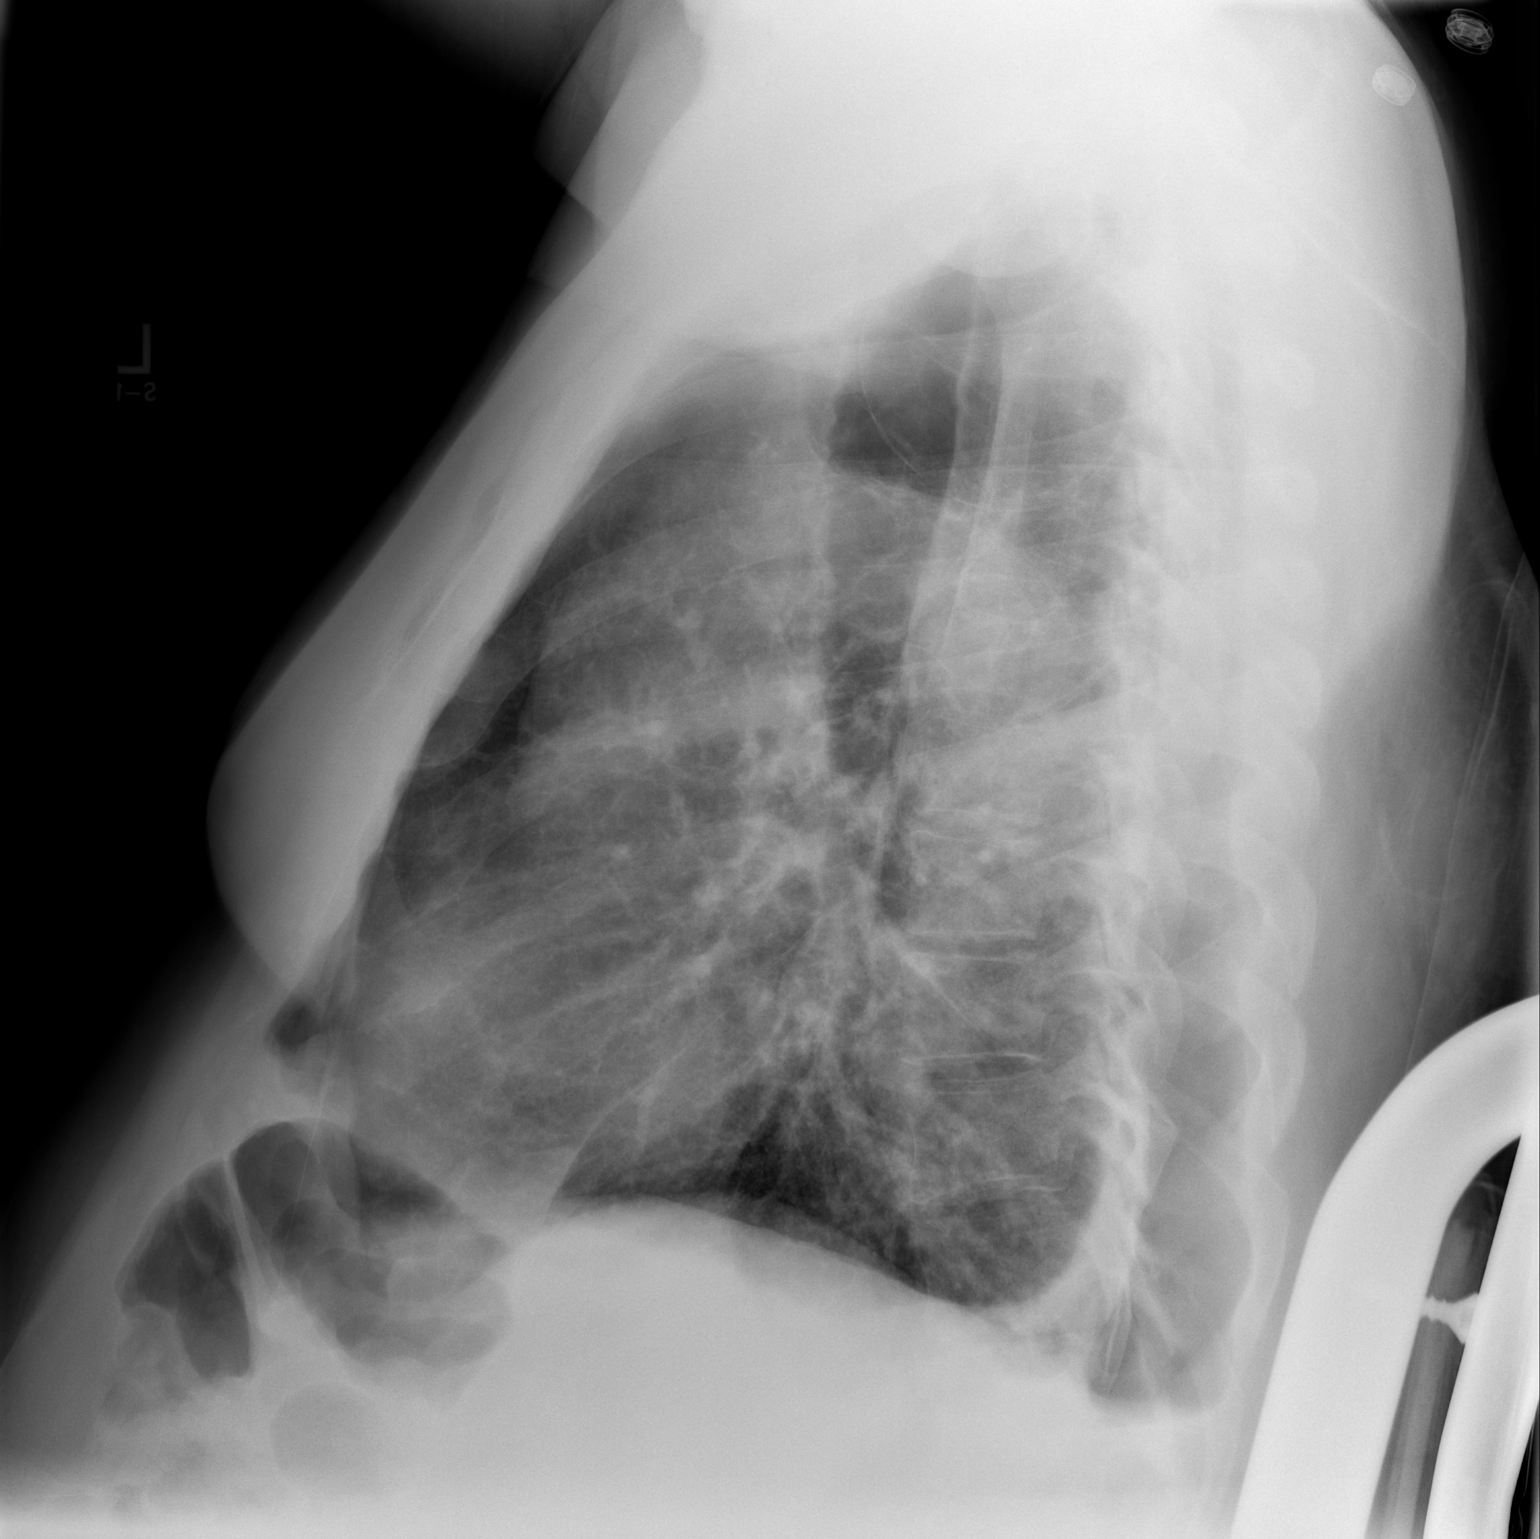

[1 of 1 positions shown; findings below may reference images not displayed]

FINDINGS: There has been slight increase in volume of the right
pneumothorax now approximately 30%.  The left lung is clear.  Heart
size is stable
IMPRESSION: Increase in right pneumothorax, now approximately 30% in volume.

## 2010-09-11 IMAGING — CR DG CHEST 1V PORT
1 series · 1 of 1 positions shown · non-contrast
Comparison: 01/25/2010 at [DATE] a.m.

CLINICAL DATA: Status post thoracotomy.  Pneumothorax.  Right IJ
line placement.

PORTABLE CHEST - 1 VIEW

[view not recorded]
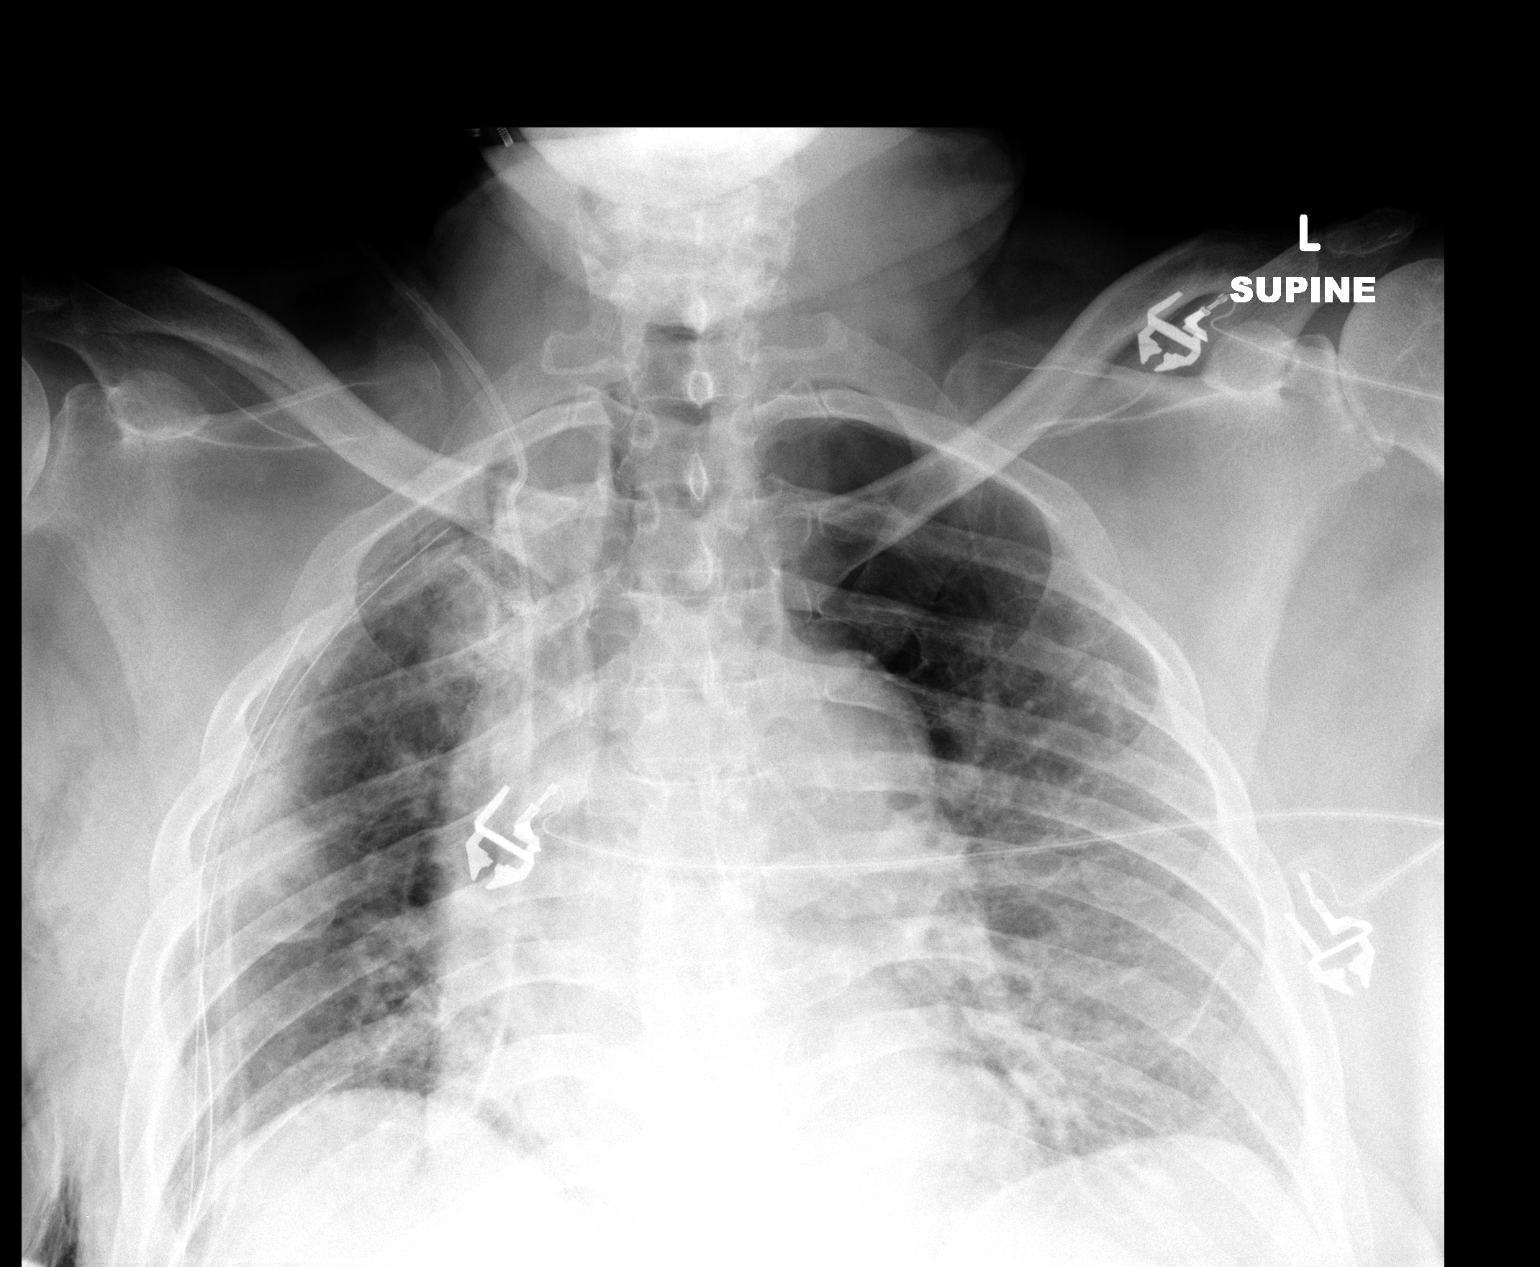

[1 of 1 positions shown; findings below may reference images not displayed]

FINDINGS: The cardiopericardial silhouette is mildly enlarged.
Lung volumes are low.  Two right-sided chest tubes are in place.
No definite residual pneumothorax is seen.  A new right IJ line is
in place.  The tip is in the right atrium, approximately 5 cm below
the cavoatrial junction.
IMPRESSION: 1.  Two right-sided chest tubes in place without evidence for
pneumothorax.
2.  The right IJ line is 5 cm below the cavoatrial junction.

Critical test results telephoned to [email or web address], the patient's nurse on

## 2010-09-11 IMAGING — CR DG CHEST 1V PORT
1 series · 1 of 1 positions shown · non-contrast
Comparison: 01/24/2010

CLINICAL DATA: New right pneumothorax, chest tube

PORTABLE CHEST - 1 VIEW

[AP]
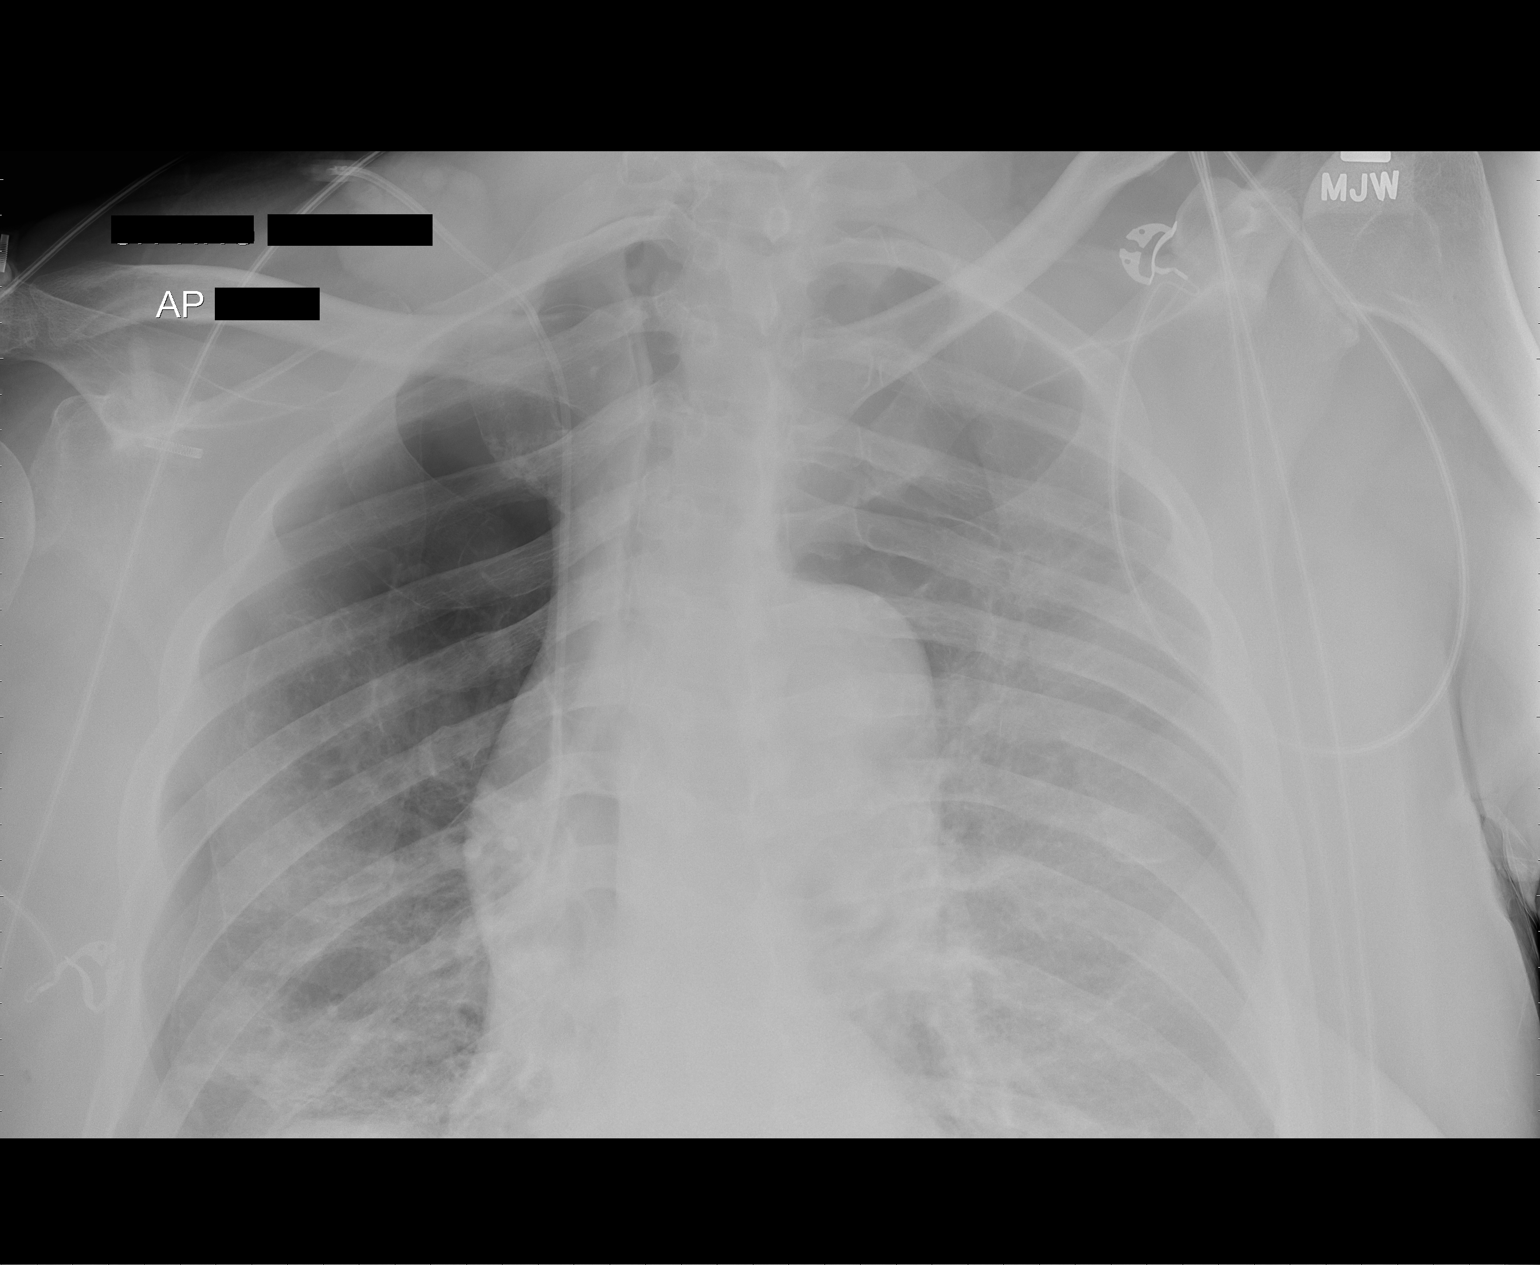

[1 of 1 positions shown; findings below may reference images not displayed]

FINDINGS: Right basilar chest tube partially included on the bottom
of the film.  There is slight interval increase in the right
pneumothorax with bilateral apical bullous disease noted.   right
IJ central line remains in the SVC.  Stable mild diffuse
interstitial prominence as before.  No significant consolidation or
pneumonia.
IMPRESSION: Right base chest tube remains.
Slight increase in right pneumothorax compared yesterday

## 2010-09-12 IMAGING — CR DG CHEST 1V PORT
1 series · 1 of 1 positions shown · non-contrast
Comparison: Chest radiograph 01/25/2010

CLINICAL DATA: Right thoracotomy

PORTABLE CHEST - 1 VIEW

[AP]
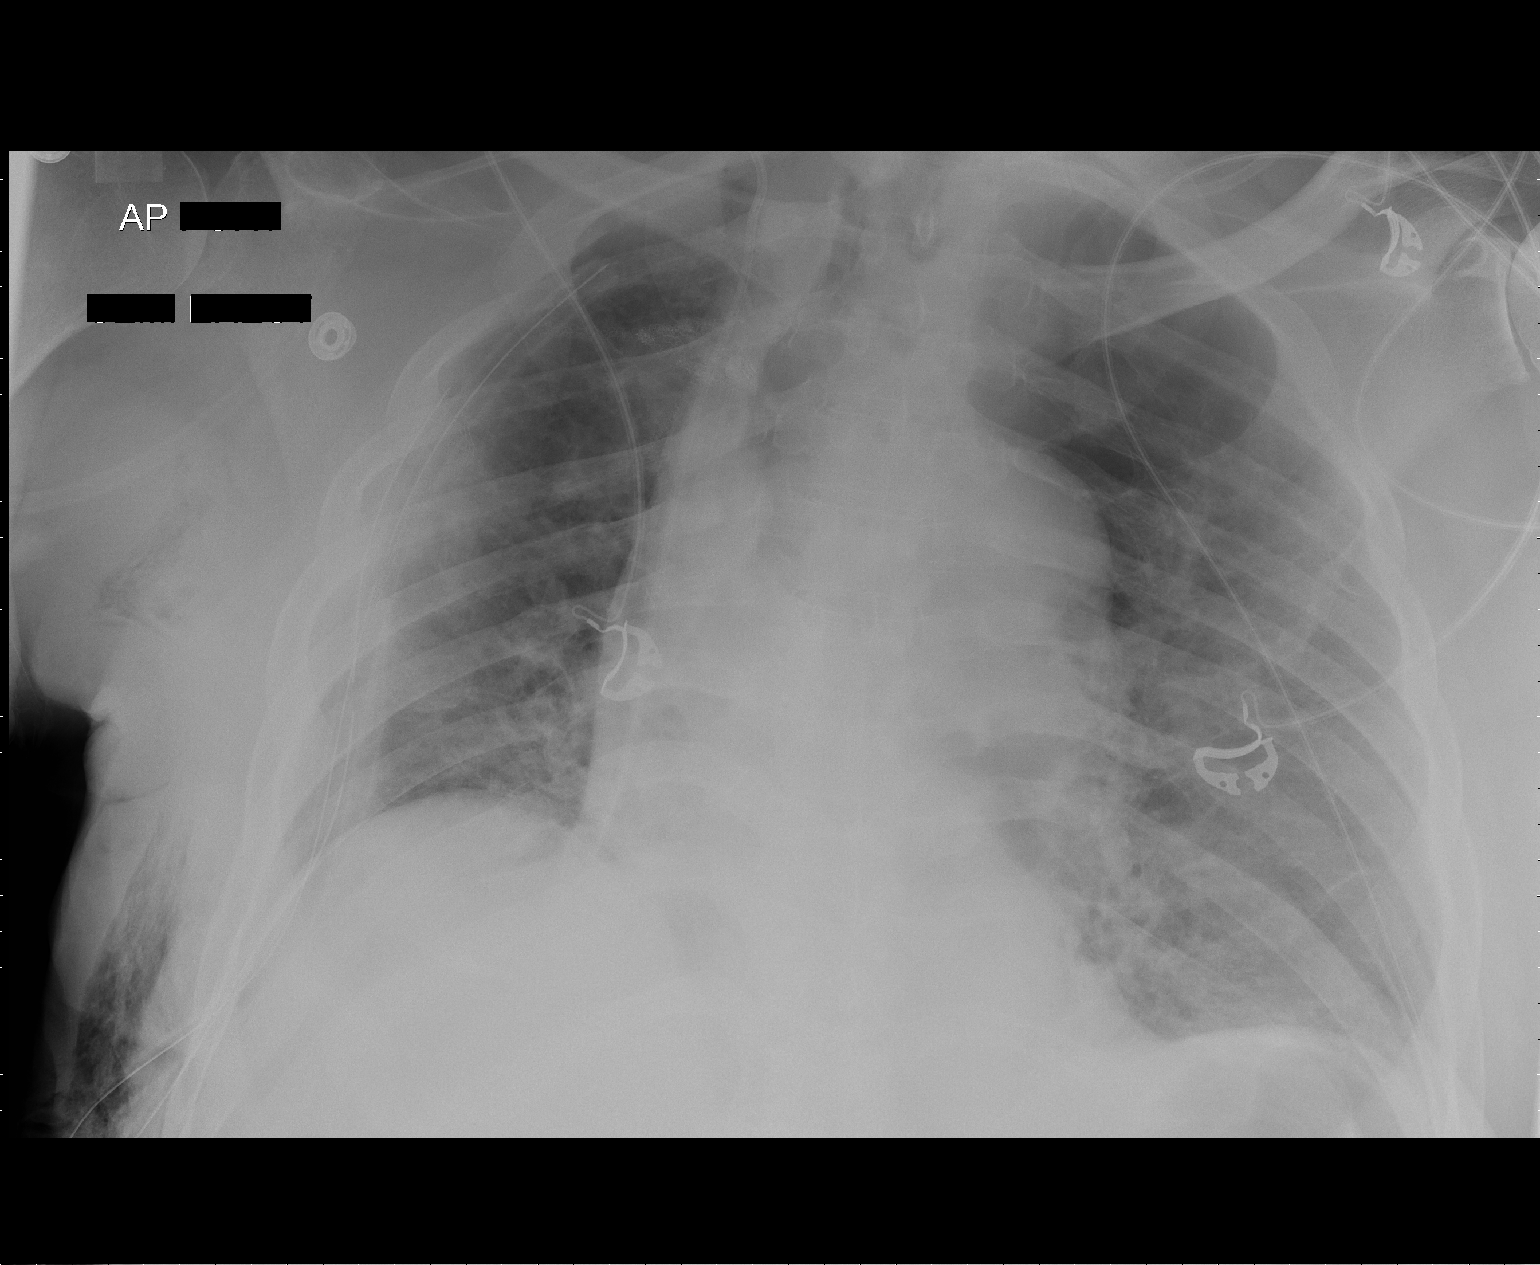

[1 of 1 positions shown; findings below may reference images not displayed]

FINDINGS: Right central venous line  is unchanged with tip in the
right atrium.  Two right-sided chest tubes are in place.  No
evidence of pneumothorax.  Small amount of fluid along the right
lateral chest wall.  Subcutaneous gas in the right chest wall.
Stable cardiac silhouette.  There is mild interstitial edema.
IMPRESSION: 1.  No significant change.
2.  Two left chest tubes in place without evidence of pneumothorax.
3.  Right central venous line with tip in the right atrium is
unchanged

## 2010-09-13 ENCOUNTER — Encounter (HOSPITAL_COMMUNITY)
Admission: RE | Admit: 2010-09-13 | Discharge: 2010-10-13 | Payer: Self-pay | Source: Home / Self Care | Admitting: Internal Medicine

## 2010-09-13 IMAGING — CR DG CHEST 1V PORT
1 series · 1 of 1 positions shown · non-contrast
Comparison: Multiple recent previous exams.

CLINICAL DATA: Pneumothorax.  Status post VATS.

PORTABLE CHEST - 1 VIEW

[AP]
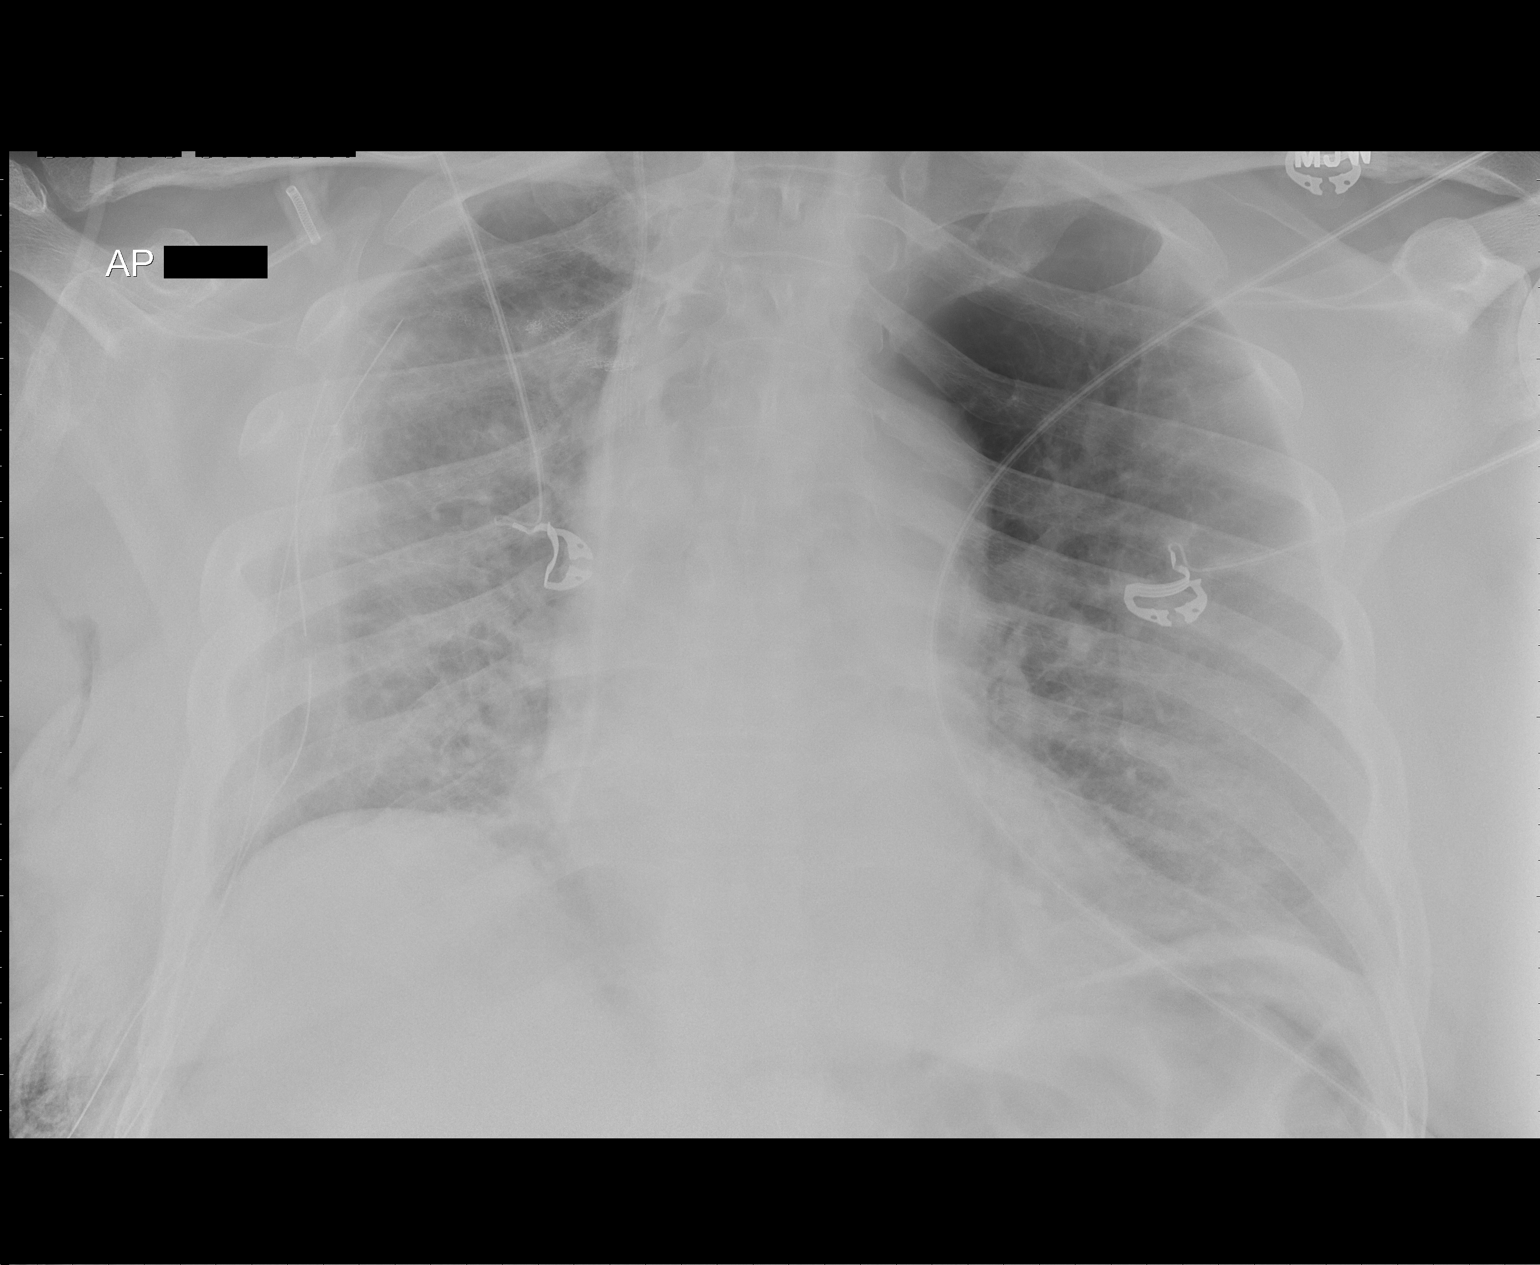

[1 of 1 positions shown; findings below may reference images not displayed]

FINDINGS: 3432 hours. The cardiopericardial silhouette is enlarged.
Underlying chronic interstitial changes consistent with emphysema.
Two right chest tubes remain in place without evidence for residual
right-sided pneumothorax.  Volume loss in the right chest is
stable.  Right IJ central venous catheter tip projects in the upper
right atrium.
IMPRESSION: No substantial interval change in exam.

## 2010-09-14 IMAGING — CR DG CHEST 1V PORT
1 series · 1 of 1 positions shown · non-contrast
Comparison: 01/27/2010

CLINICAL DATA: Evaluate pneumothorax

PORTABLE CHEST - 1 VIEW

[view not recorded]
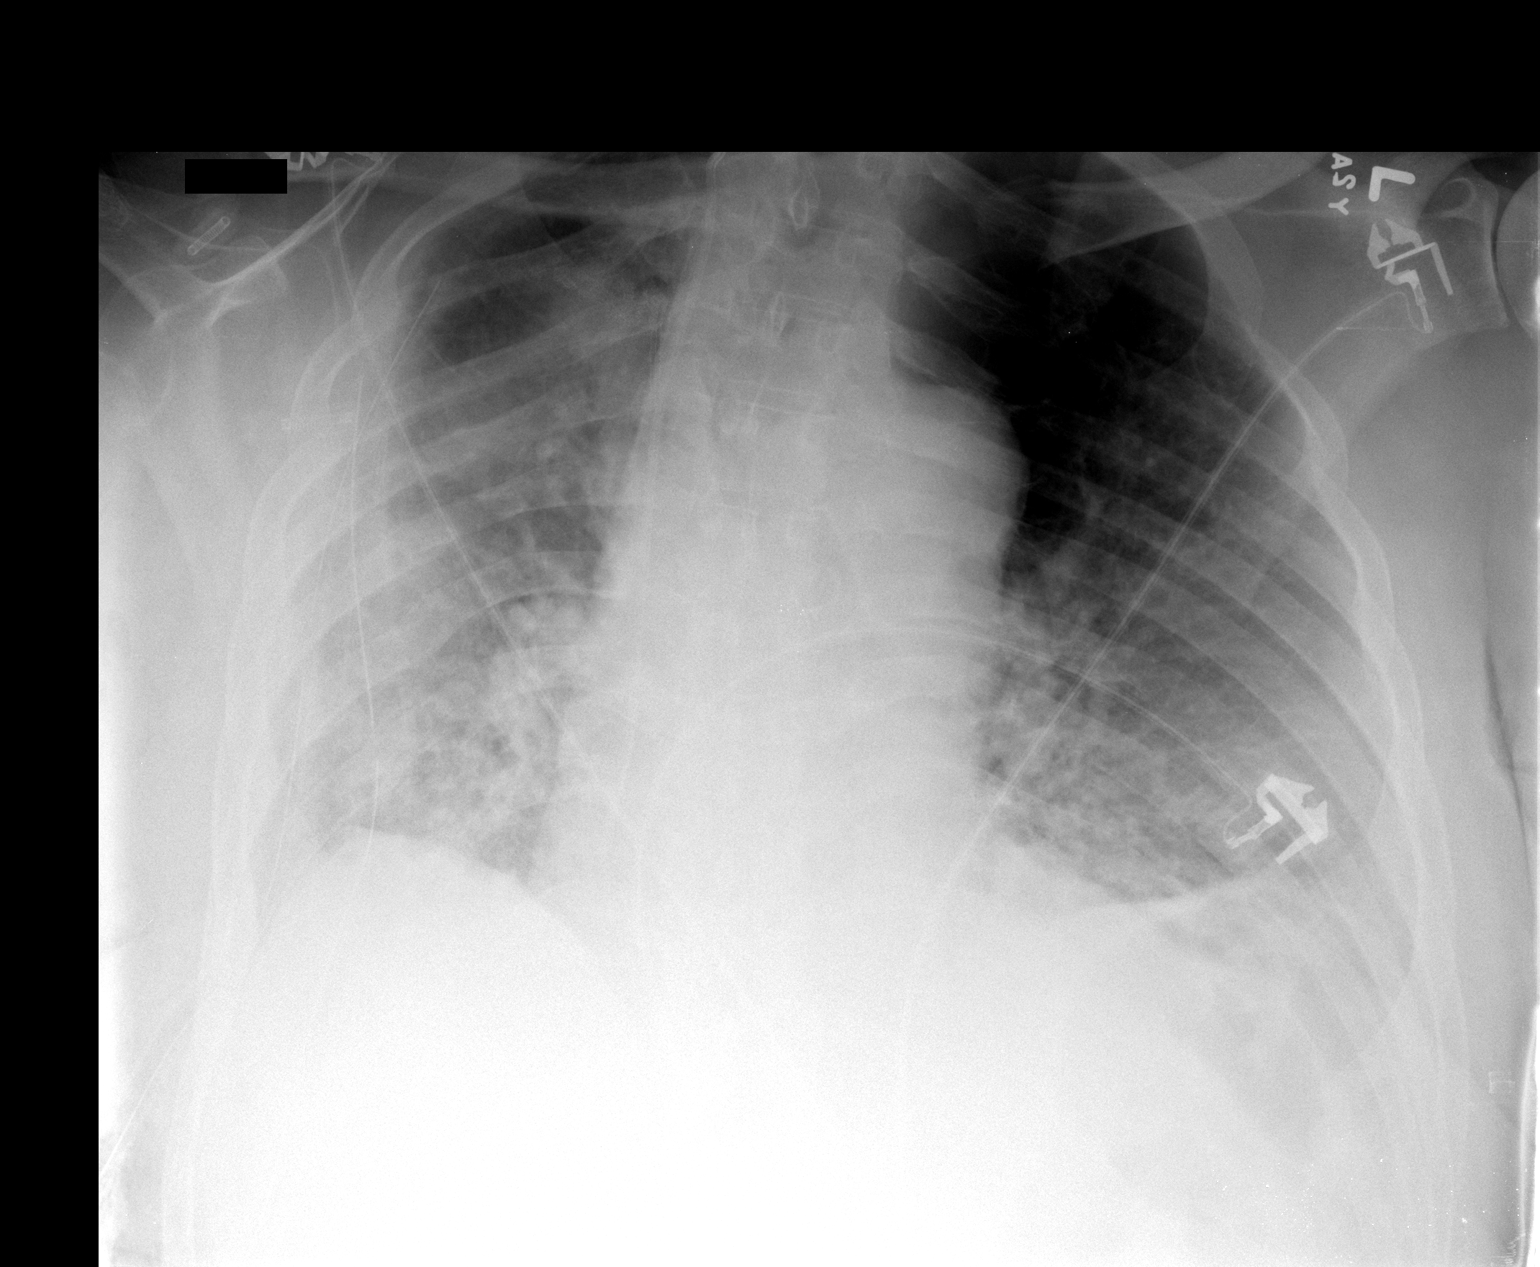

[1 of 1 positions shown; findings below may reference images not displayed]

FINDINGS: There is a right IJ catheter with tip in the right
atrium.

Two right-sided chest tubes are present.

No pneumothorax is identified.

The lung volumes are low.

There is chronic interstitial lung disease with superimposed
bibasilar atelectasis.
IMPRESSION: 1.  Stable right-sided chest tube without visible pneumothorax.

## 2010-09-15 IMAGING — CR DG CHEST 1V PORT
1 series · 1 of 1 positions shown · non-contrast
Comparison: 01/28/2010

CLINICAL DATA: Pneumothorax.

PORTABLE CHEST - 1 VIEW

[view not recorded]
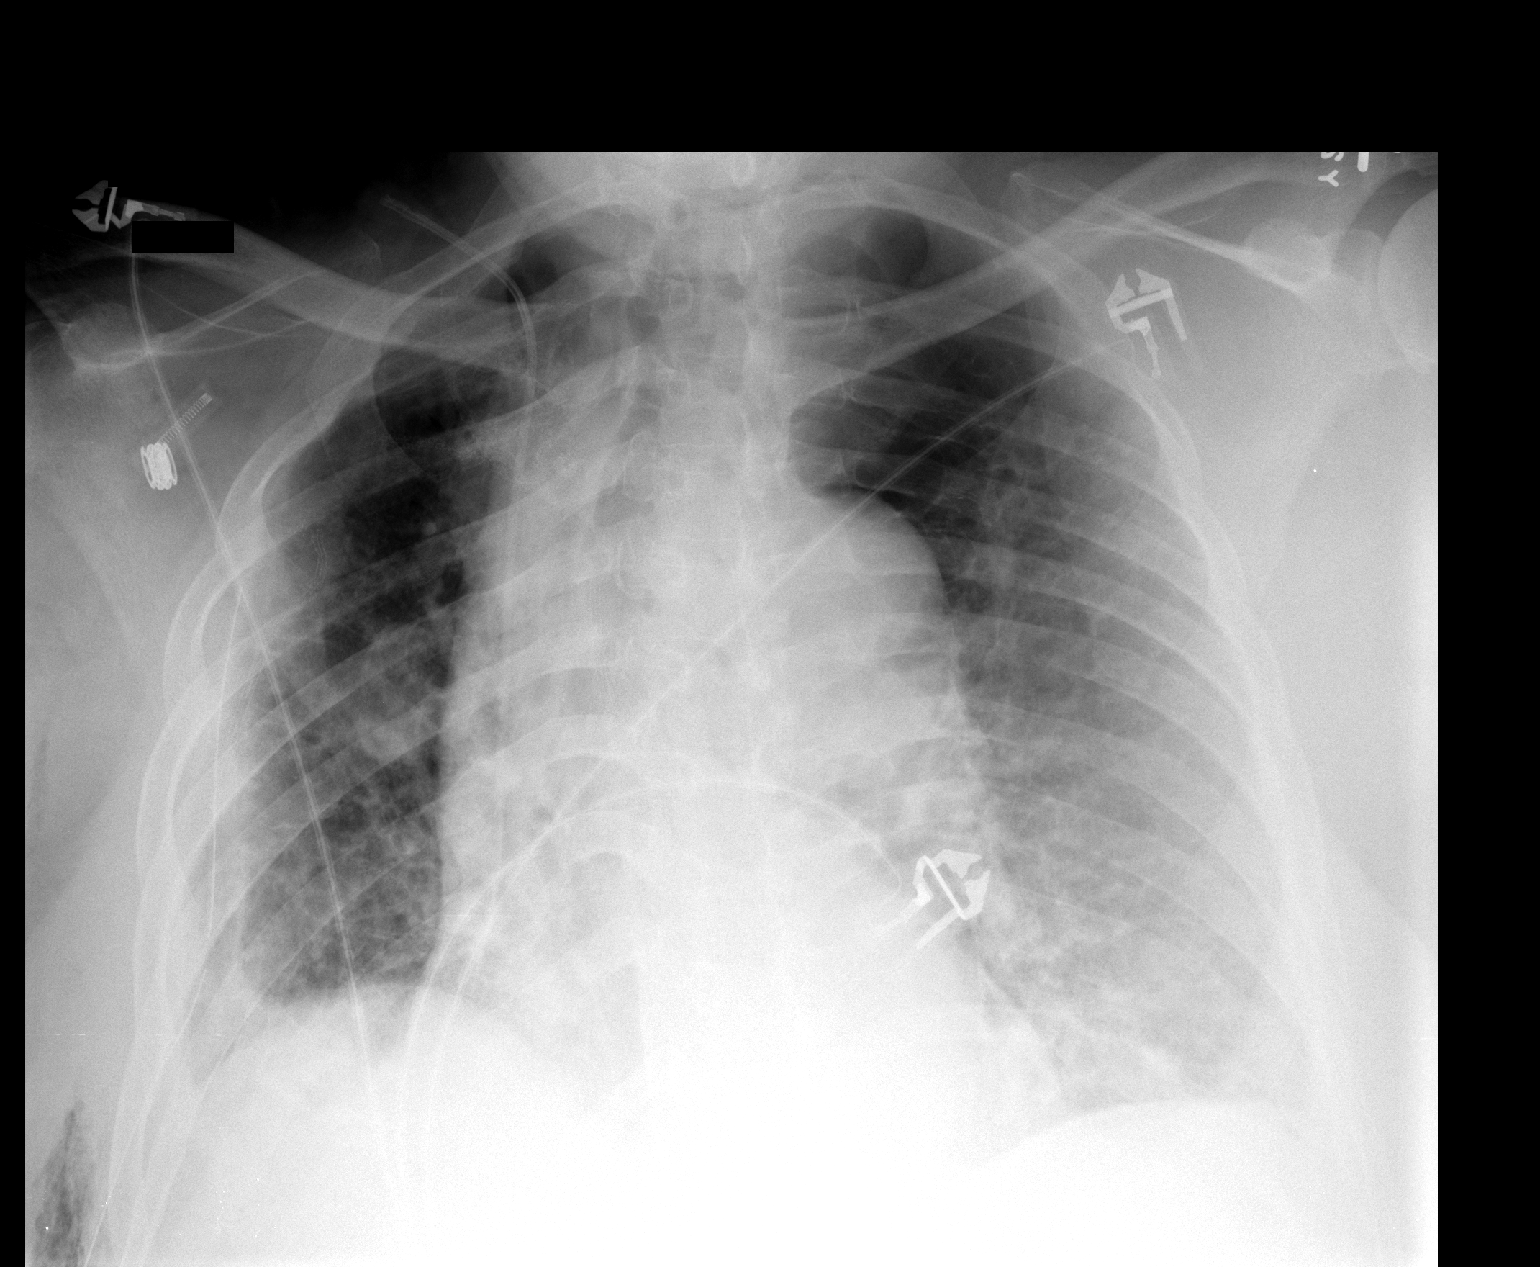

[1 of 1 positions shown; findings below may reference images not displayed]

FINDINGS: Right IJ central line tip projects over the lower SVC.
Heart size is stable.  Right chest tube remains in place.  No
definite pneumothorax.  Subcutaneous emphysema is seen along the
lower right lateral chest wall.  There are postoperative changes
and volume loss in the left hemithorax.  Biapical bullous disease,
left greater than right.  Mild bibasilar air space disease.  Small
right pleural effusion.
IMPRESSION: 1.  Biapical bullous disease, left greater than right.  No
pneumothorax with right chest tube in place.
2.  Bibasilar air space disease and small right pleural effusion.

## 2010-09-15 IMAGING — CR DG CHEST 1V PORT
1 series · 1 of 1 positions shown · non-contrast
Comparison: 01/29/2010

CLINICAL DATA: Right chest tube removal

PORTABLE CHEST - 1 VIEW

[view not recorded]
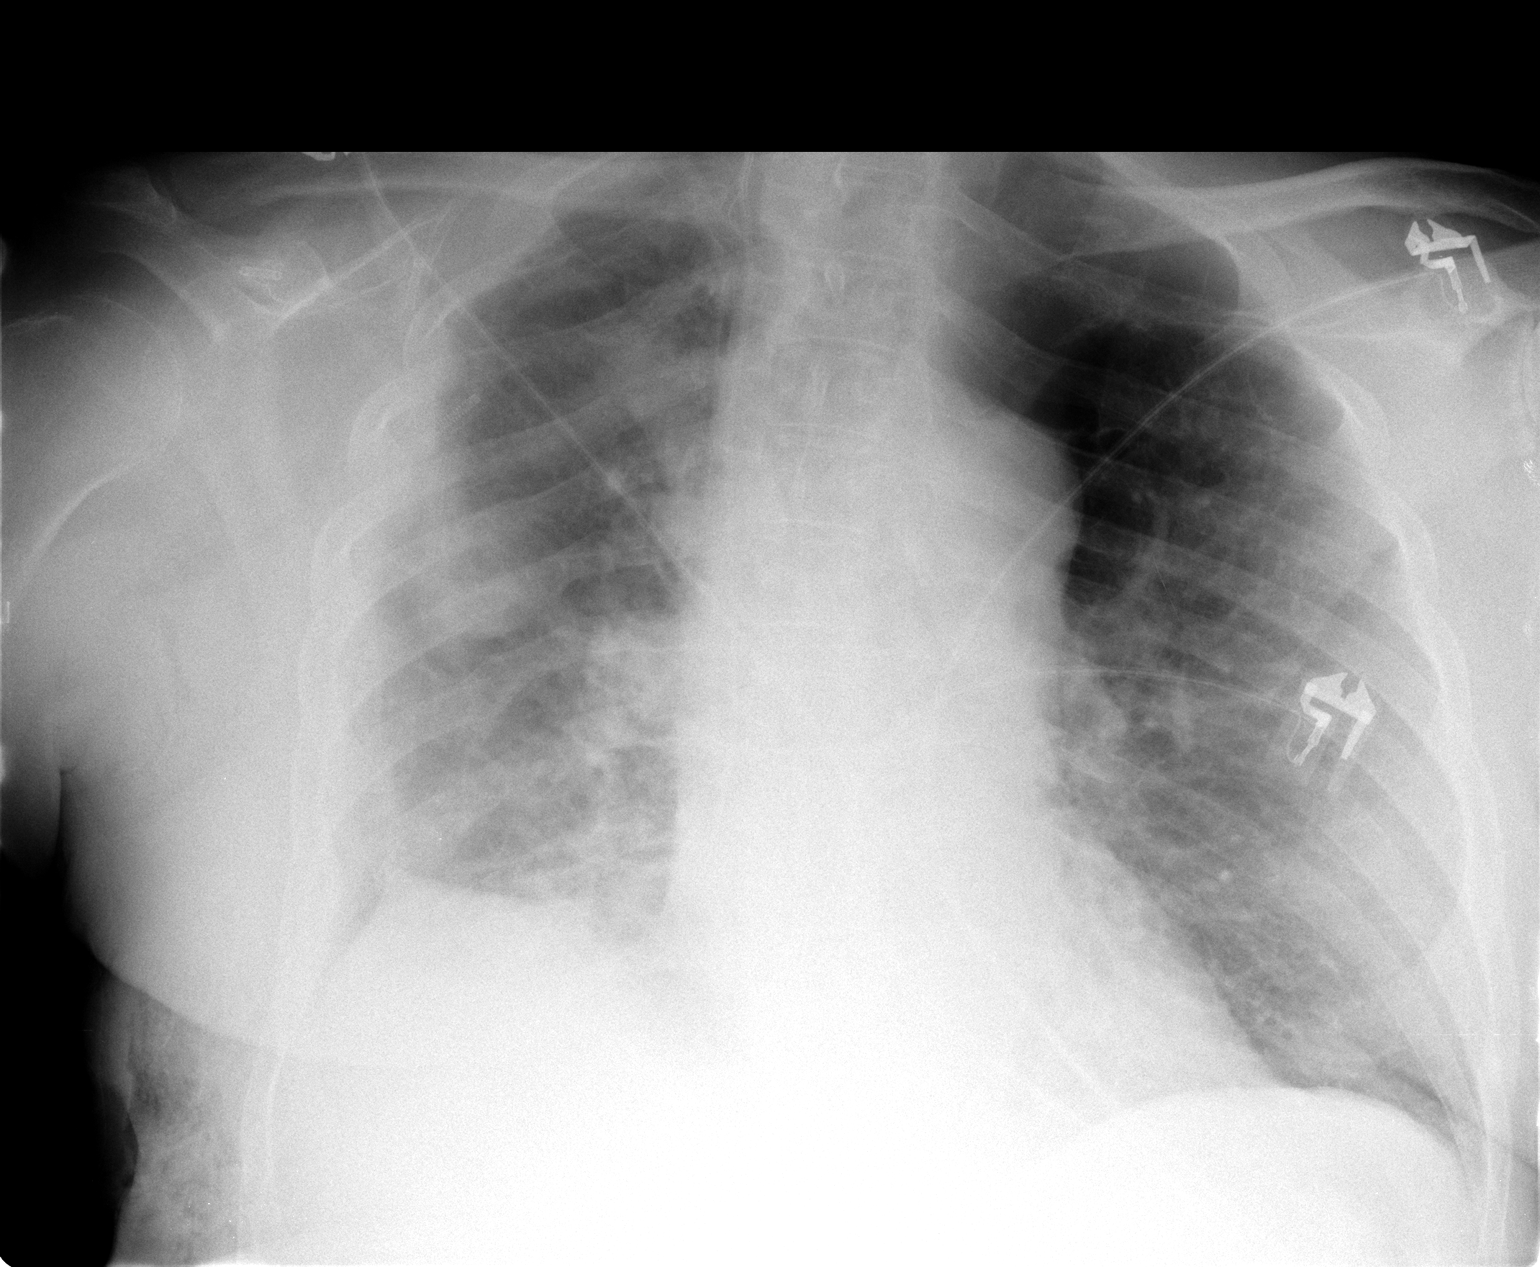

[1 of 1 positions shown; findings below may reference images not displayed]

FINDINGS: Interval right chest tube removal.  No significant
pneumothorax by plain radiography.  Stable postop changes and
atelectasis in the right lung.  Biapical bullous disease noted,
worse in the left apex.  Overall stable exam.  No new finding.
IMPRESSION: Right chest tube removal.  No significant large pneumothorax by
plain radiography.  Stable portable chest exam.

## 2010-09-16 IMAGING — CR DG CHEST 2V
1 series · 1 of 1 positions shown · non-contrast
Comparison: the previous day's study

CLINICAL DATA: Pneumothorax

CHEST - 2 VIEW

[view not recorded]
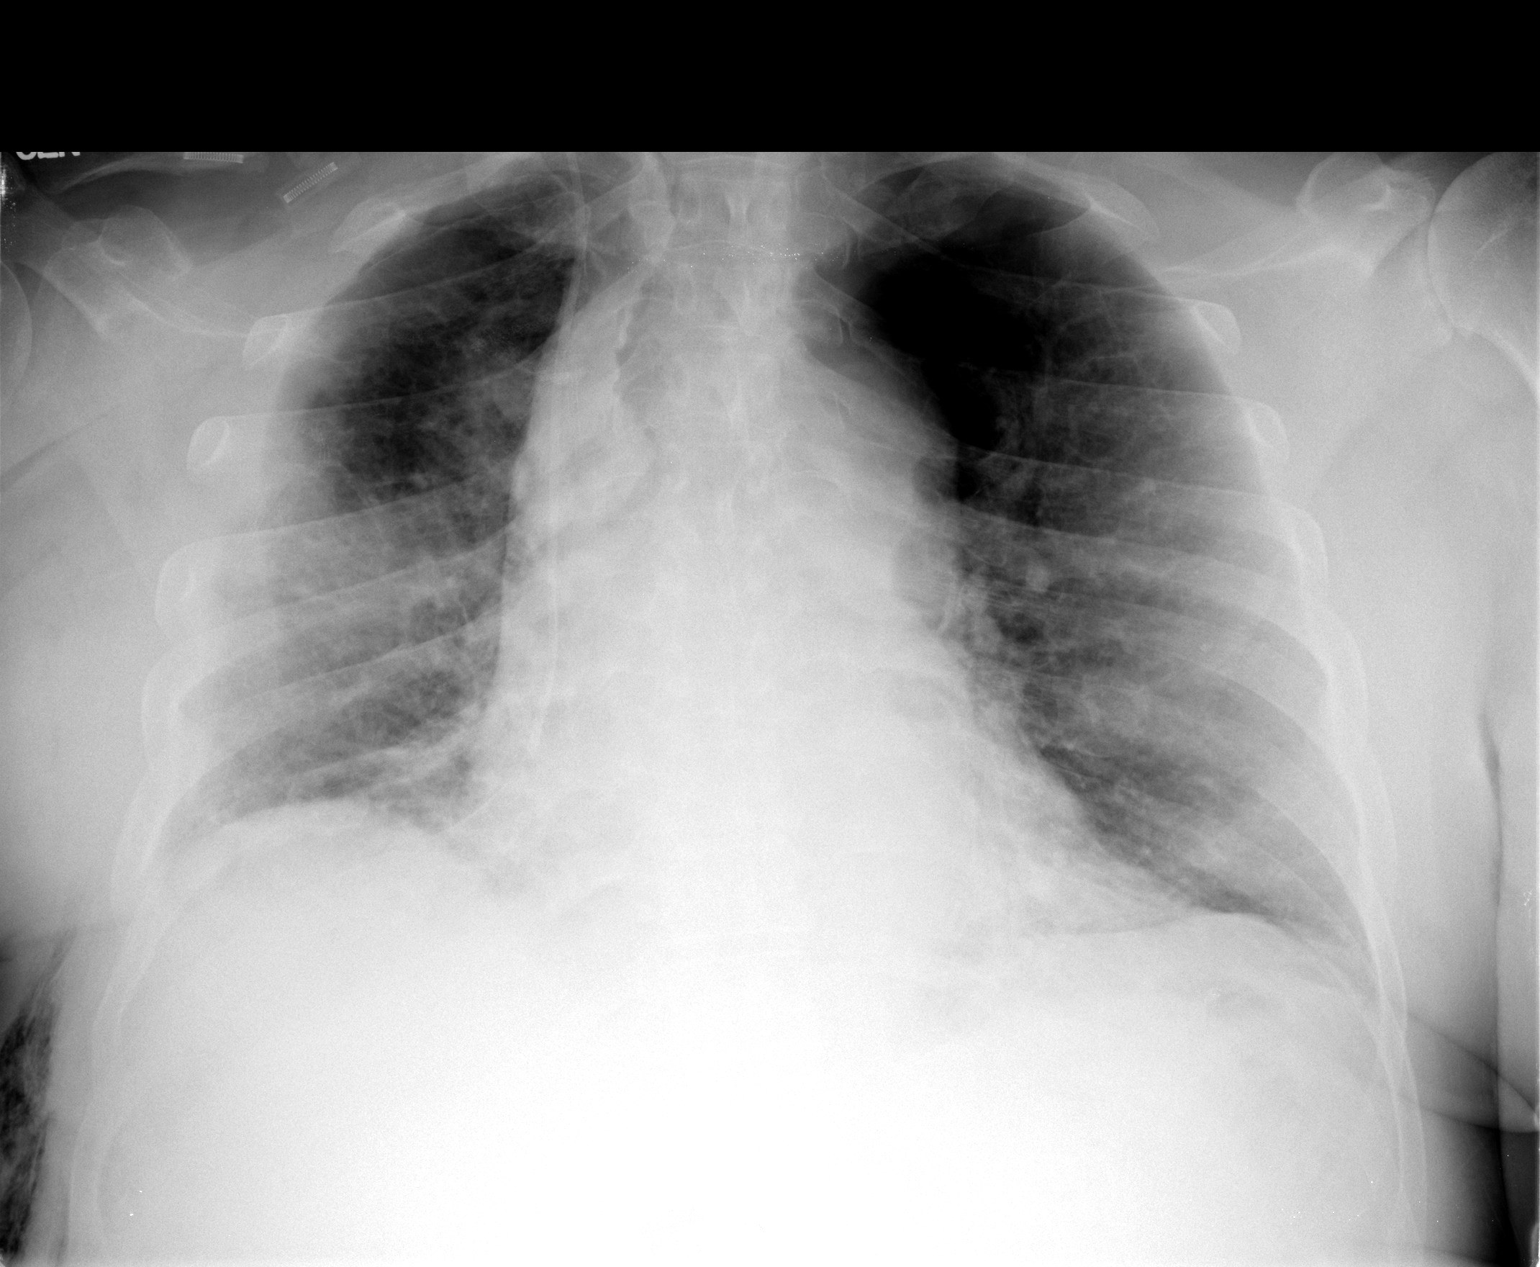

[1 of 1 positions shown; findings below may reference images not displayed]

FINDINGS: Right IJ central line stable position.  No pneumothorax
evident.  Coarse bronchovascular markings throughout the lungs with
attenuated biapical interstitial markings suggesting bullous
disease.  Heart size upper limits normal.  No effusion.
IMPRESSION: Stable appearance since previous day's portable exam

## 2010-09-17 IMAGING — CR DG CHEST 2V
1 series · 1 of 1 positions shown · non-contrast
Comparison: the previous day's study

CLINICAL DATA: Pneumothorax

CHEST - 2 VIEW

[view not recorded]
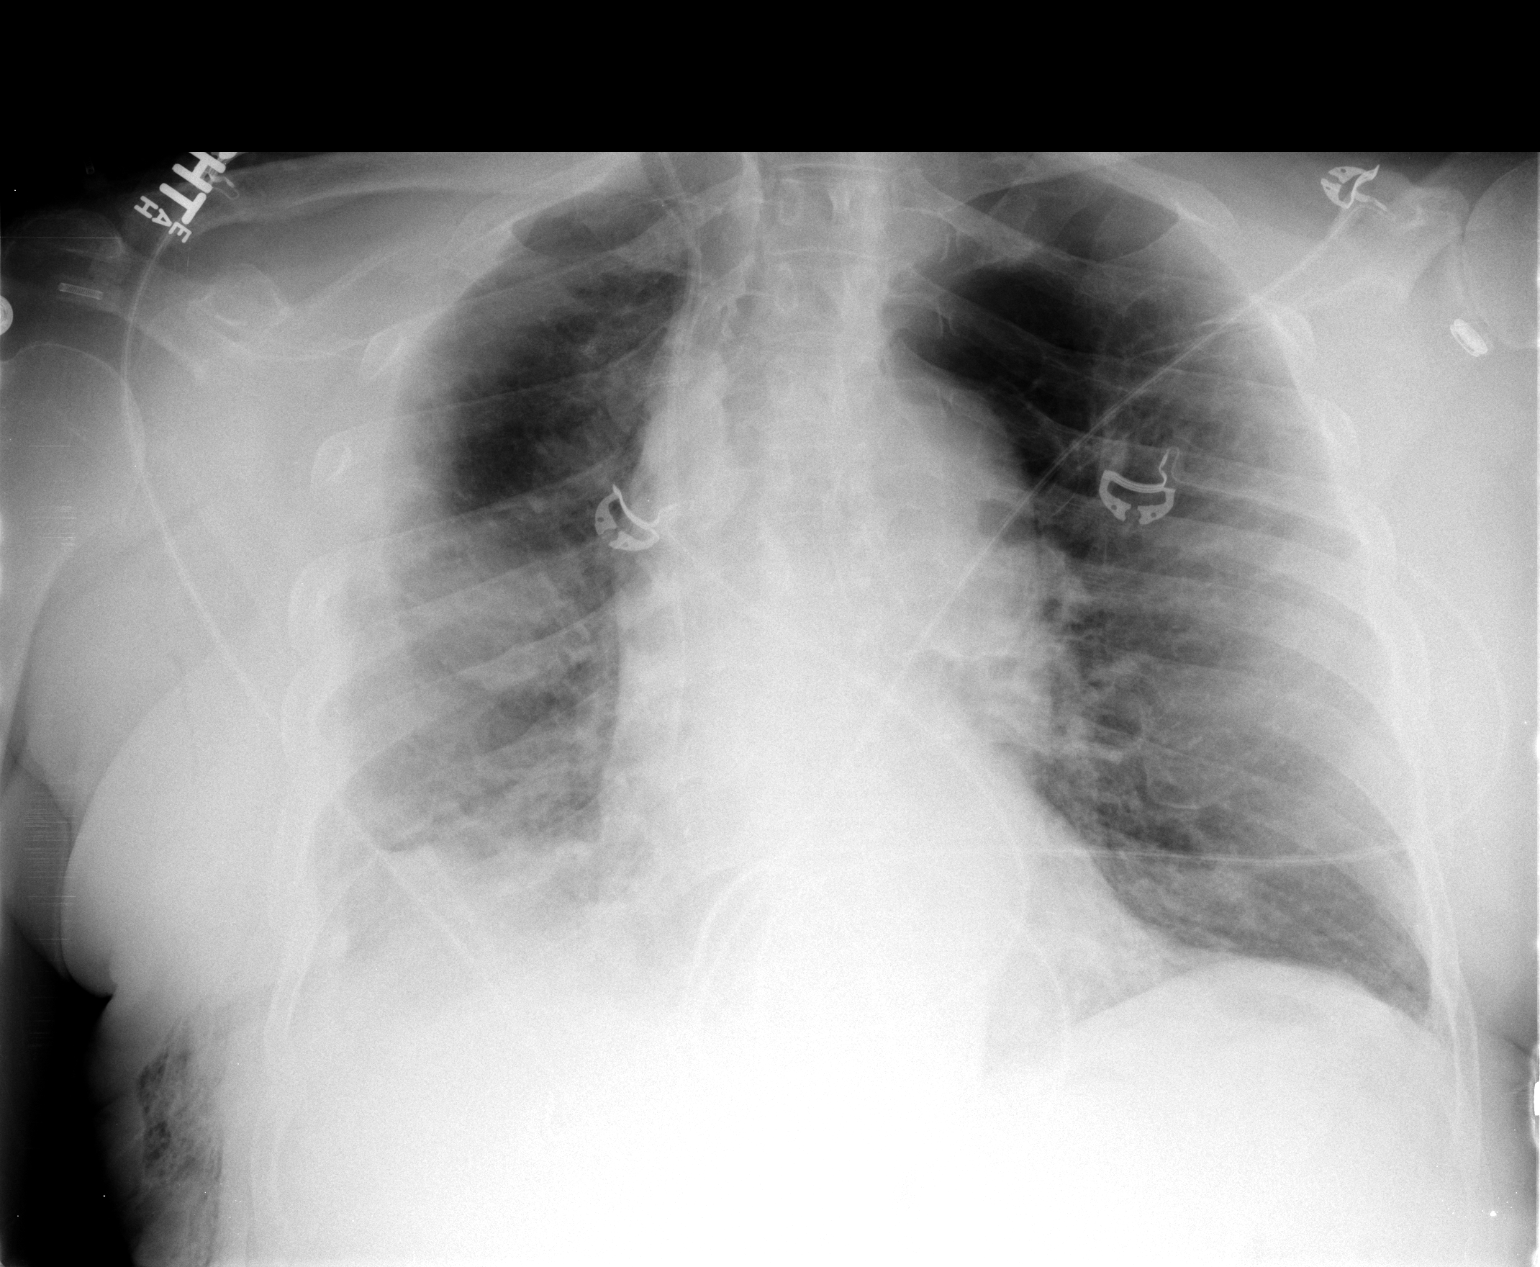

[1 of 1 positions shown; findings below may reference images not displayed]

FINDINGS: Right IJ central line stable in position.  Bullous
changes in the lung apices.  Prominent interstitial opacities in
the lower lung fields as before without confluent airspace
consolidation.  Question small right pleural effusion.  No
pneumothorax evident.  Heart size is normal.
IMPRESSION: Stable chest.

## 2010-09-22 IMAGING — CR DG CHEST 2V
2 series · 2 of 2 positions shown · non-contrast
Comparison: 01/31/2010

CLINICAL DATA: History pneumothorax.

CHEST - 2 VIEW

[w chest lat]
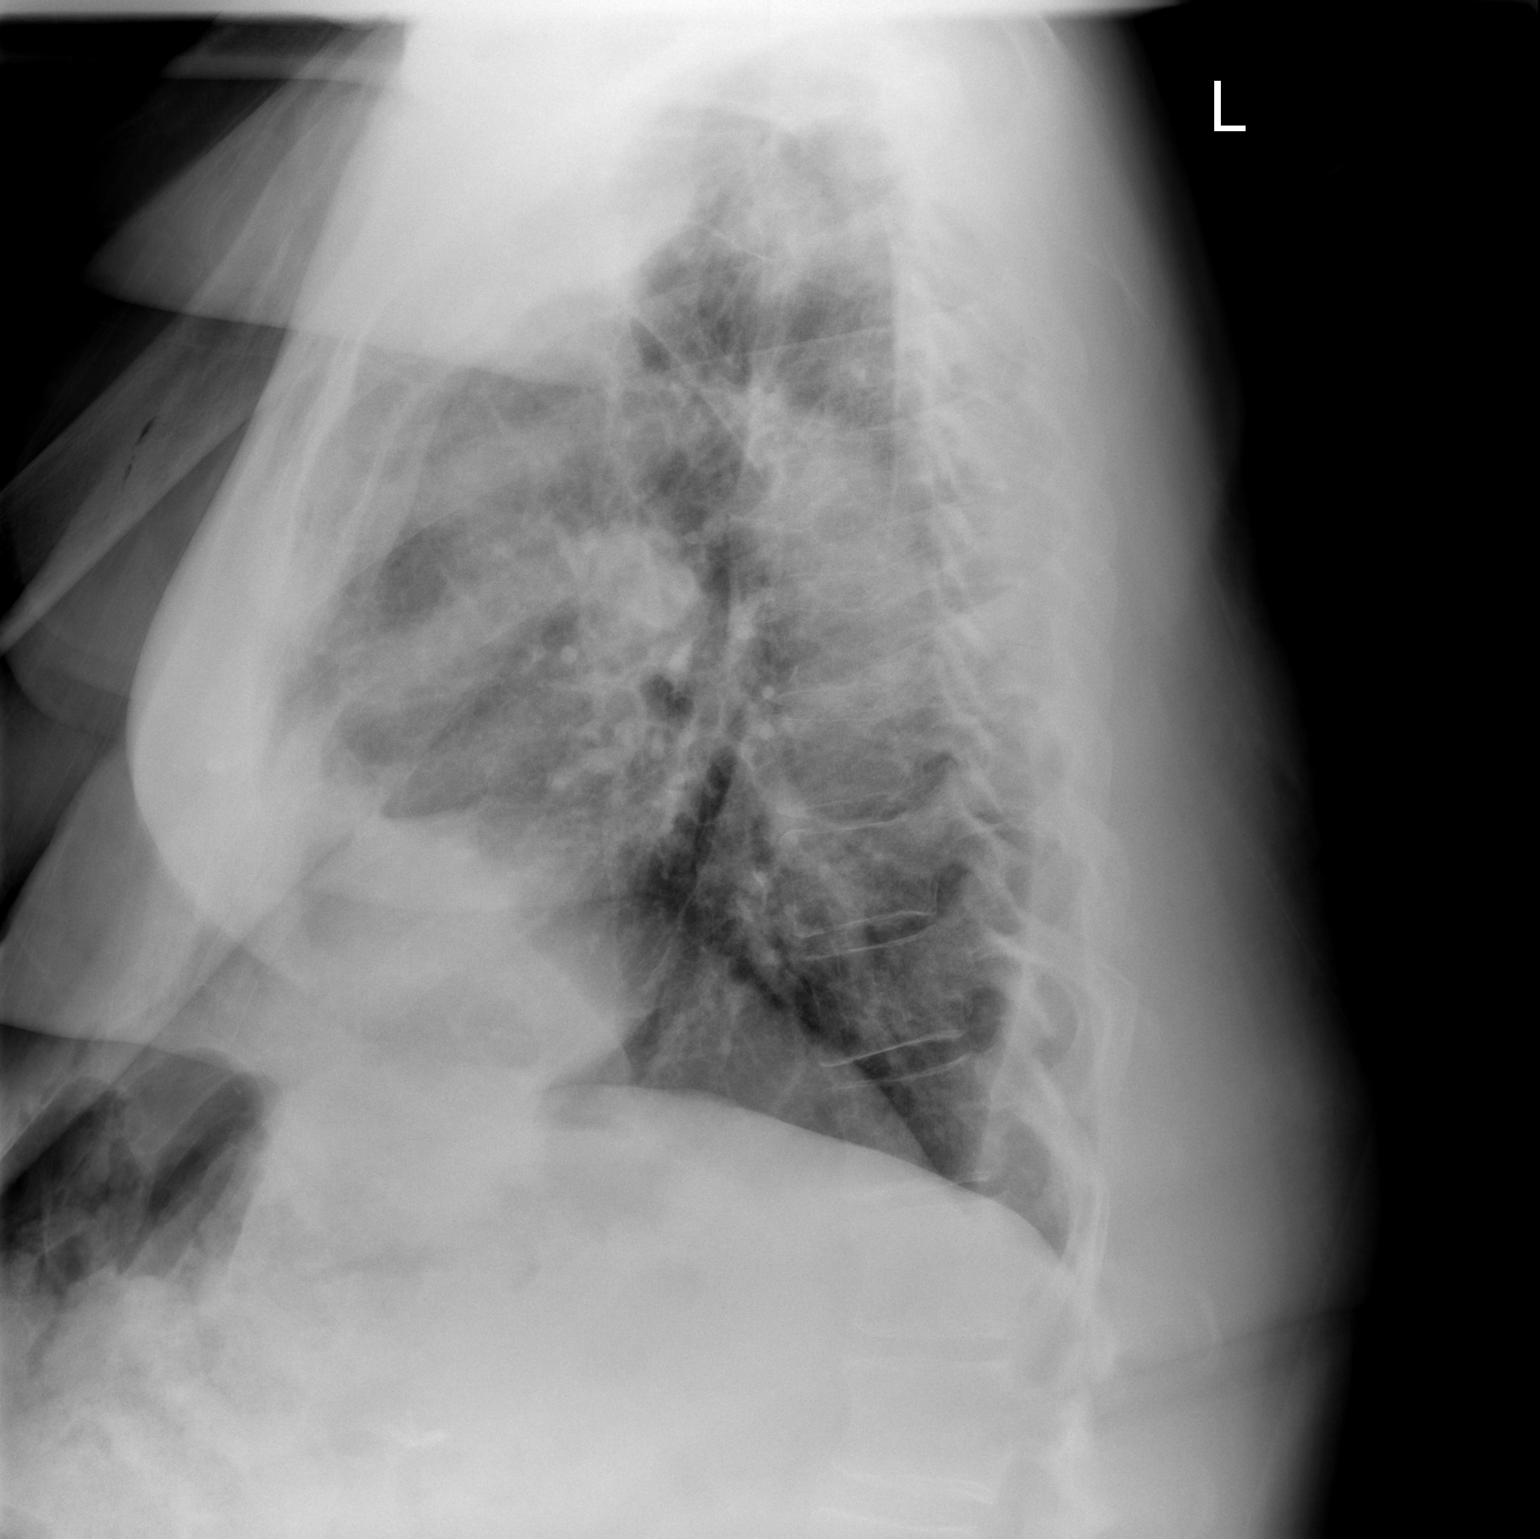

[w chest ap]
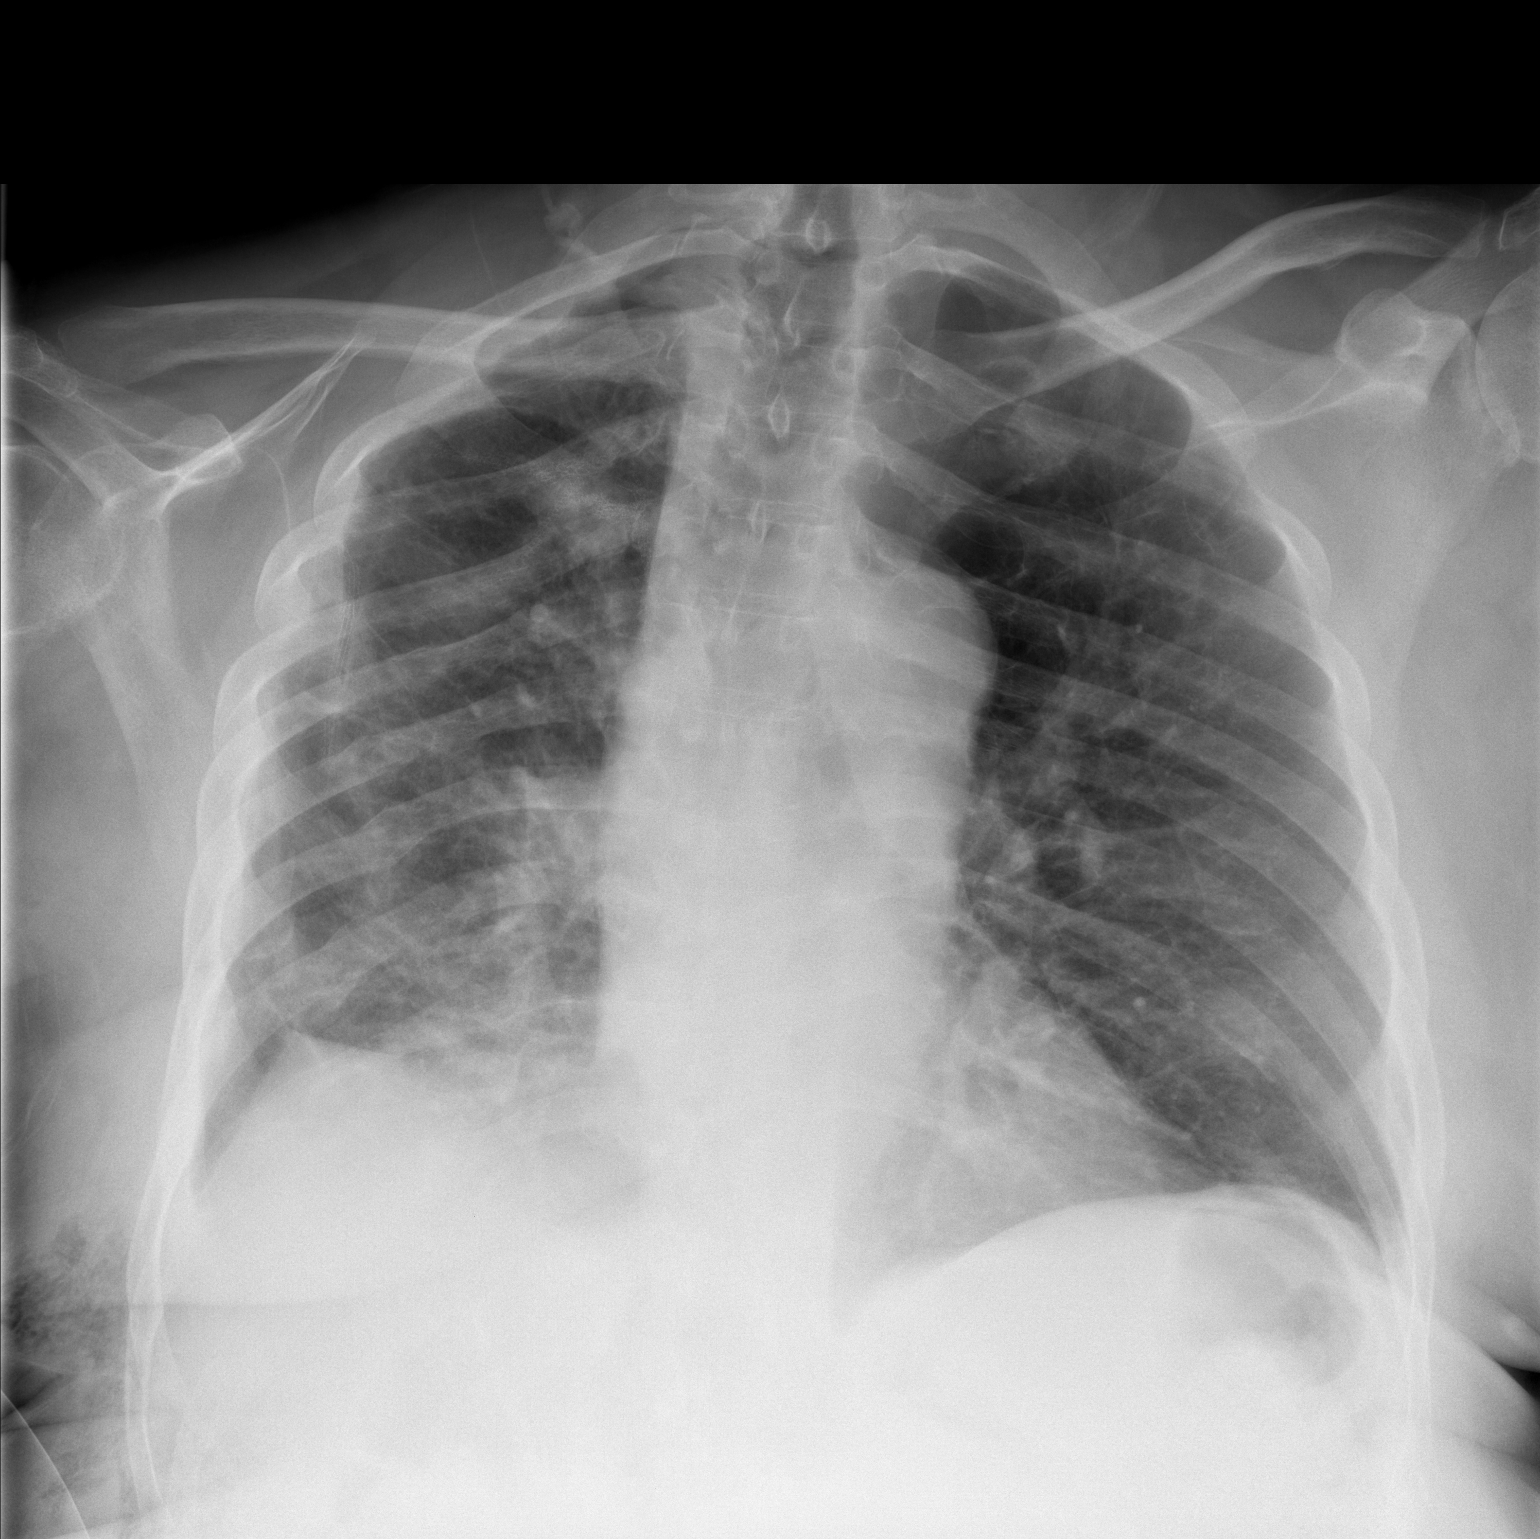

[2 of 2 positions shown; findings below may reference images not displayed]

FINDINGS: Two-view exam shows no evidence for pneumothorax.  The
right greater than left pleural parenchymal scarring is stable.
Cardiopericardial silhouette is at upper limits of normal for size.
No evidence for pulmonary edema or pleural effusion on today's
study. Imaged bony structures of the thorax are intact.
IMPRESSION: Stable exam.

## 2010-10-20 IMAGING — CR DG CHEST 2V
4 series · 4 of 4 positions shown · non-contrast
Comparison: Chest x-ray of 02/05/2010

CLINICAL DATA: Pneumothorax, follow-up

CHEST - 2 VIEW

[w chest lat (1 of 2)]
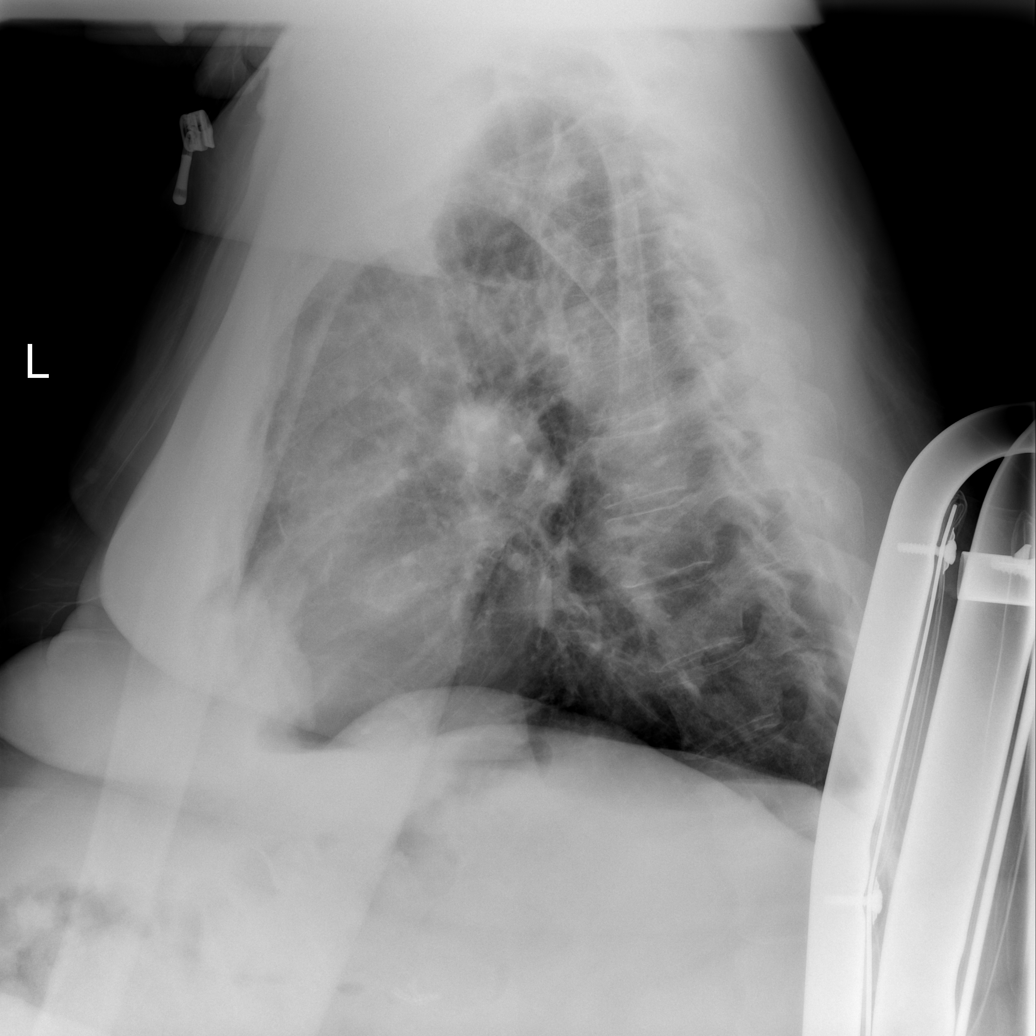

[w chest lat (2 of 2)]
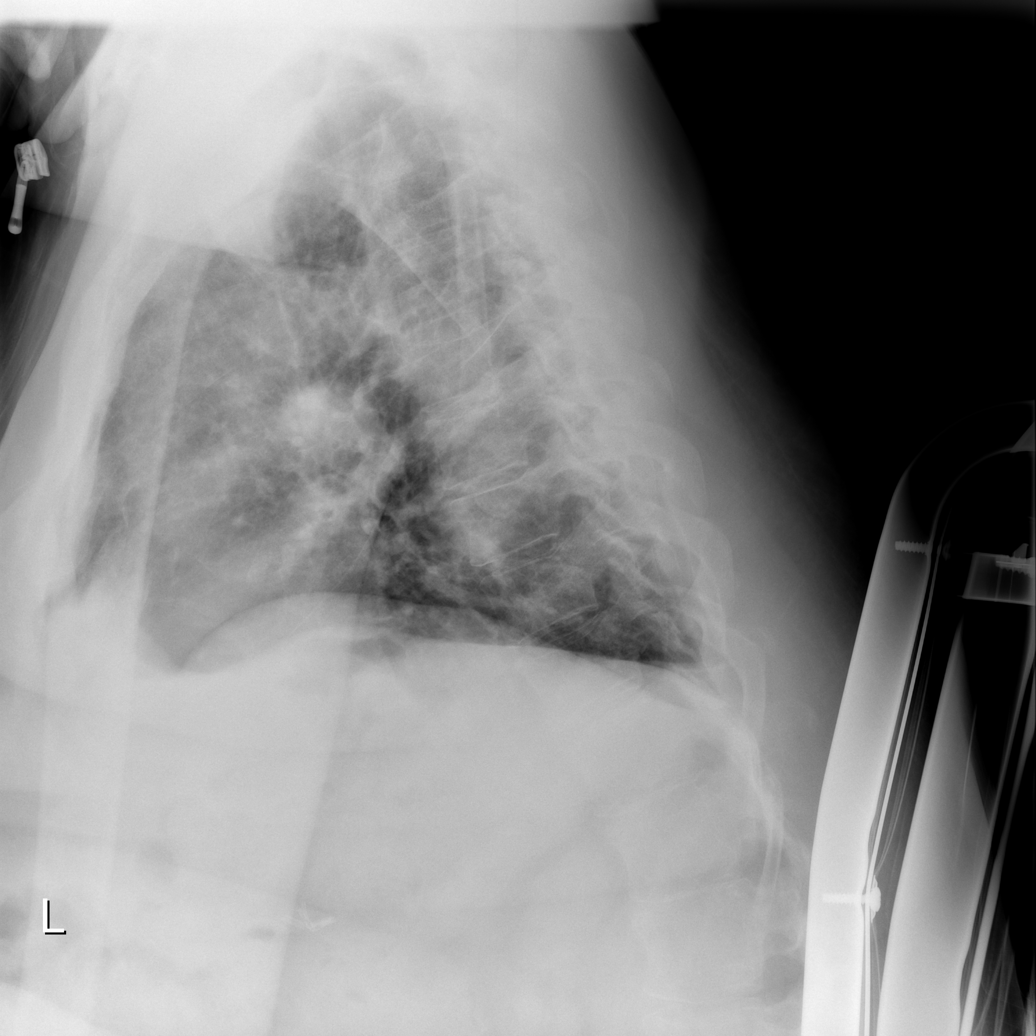

[view not recorded (1 of 2)]
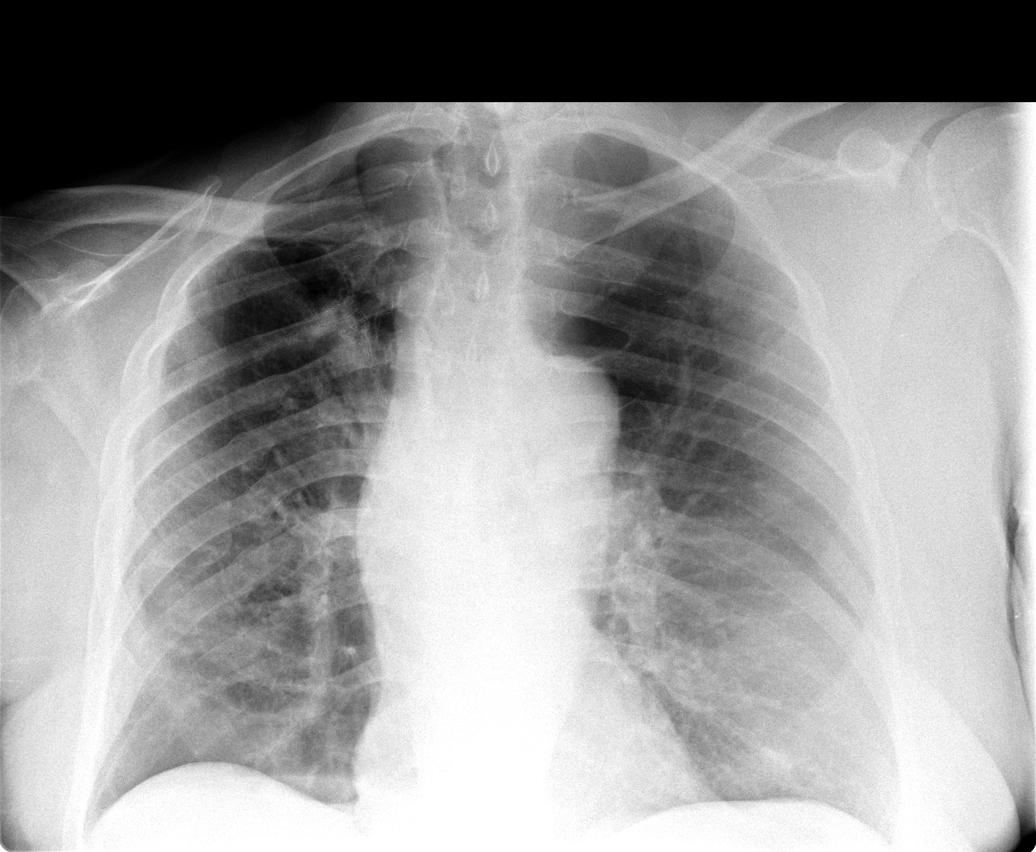

[view not recorded (2 of 2)]
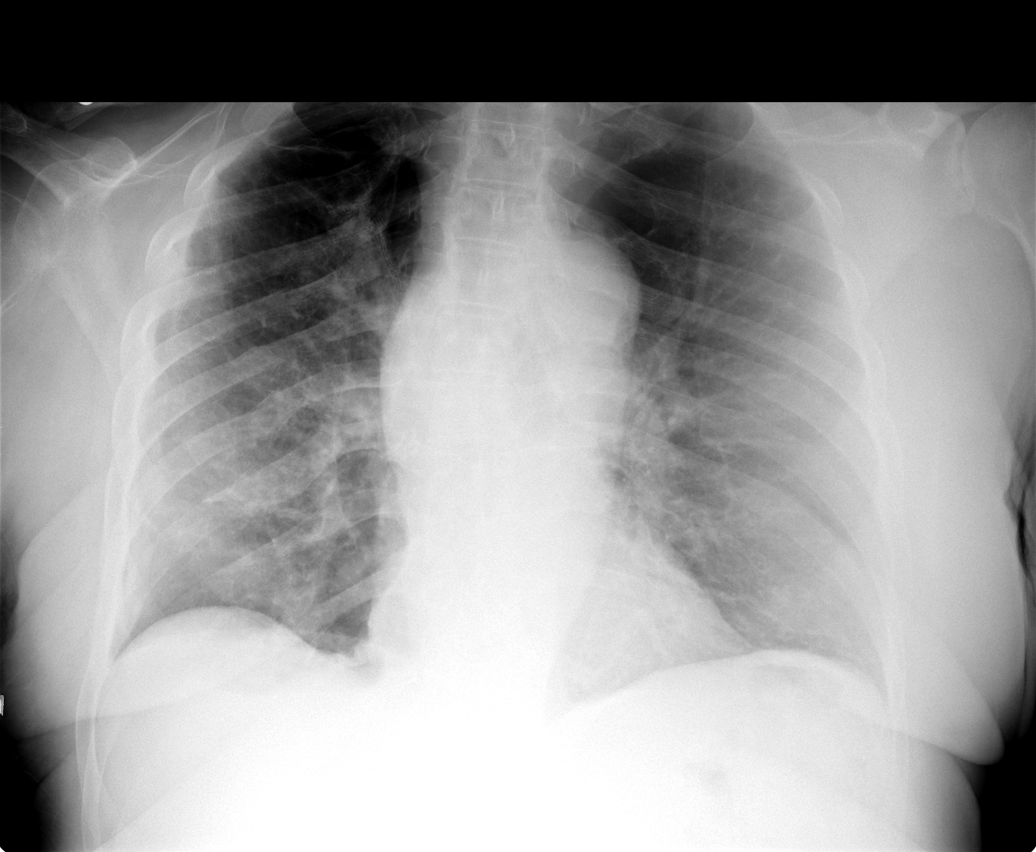

[4 of 4 positions shown; findings below may reference images not displayed]

FINDINGS: The lungs appear better aerated.  Sutures are noted
within the medial right upper lung field.  No definite pneumothorax
is seen.  No pleural effusion is noted.  Heart size is stable.
Ectatic ascending and descending thoracic aorta again is noted.
IMPRESSION: Improved aeration.  No pneumothorax.  Postop changes in the right
upper hemithorax.

## 2010-12-16 DIAGNOSIS — R1011 Right upper quadrant pain: Secondary | ICD-10-CM | POA: Insufficient documentation

## 2010-12-16 DIAGNOSIS — R748 Abnormal levels of other serum enzymes: Secondary | ICD-10-CM | POA: Insufficient documentation

## 2010-12-20 IMAGING — CR DG CHEST 2V
2 series · 2 of 2 positions shown · non-contrast
Comparison: 03/05/2010.

CLINICAL DATA: Follow-up pneumothorax.

CHEST - 2 VIEW

[w chest lat]
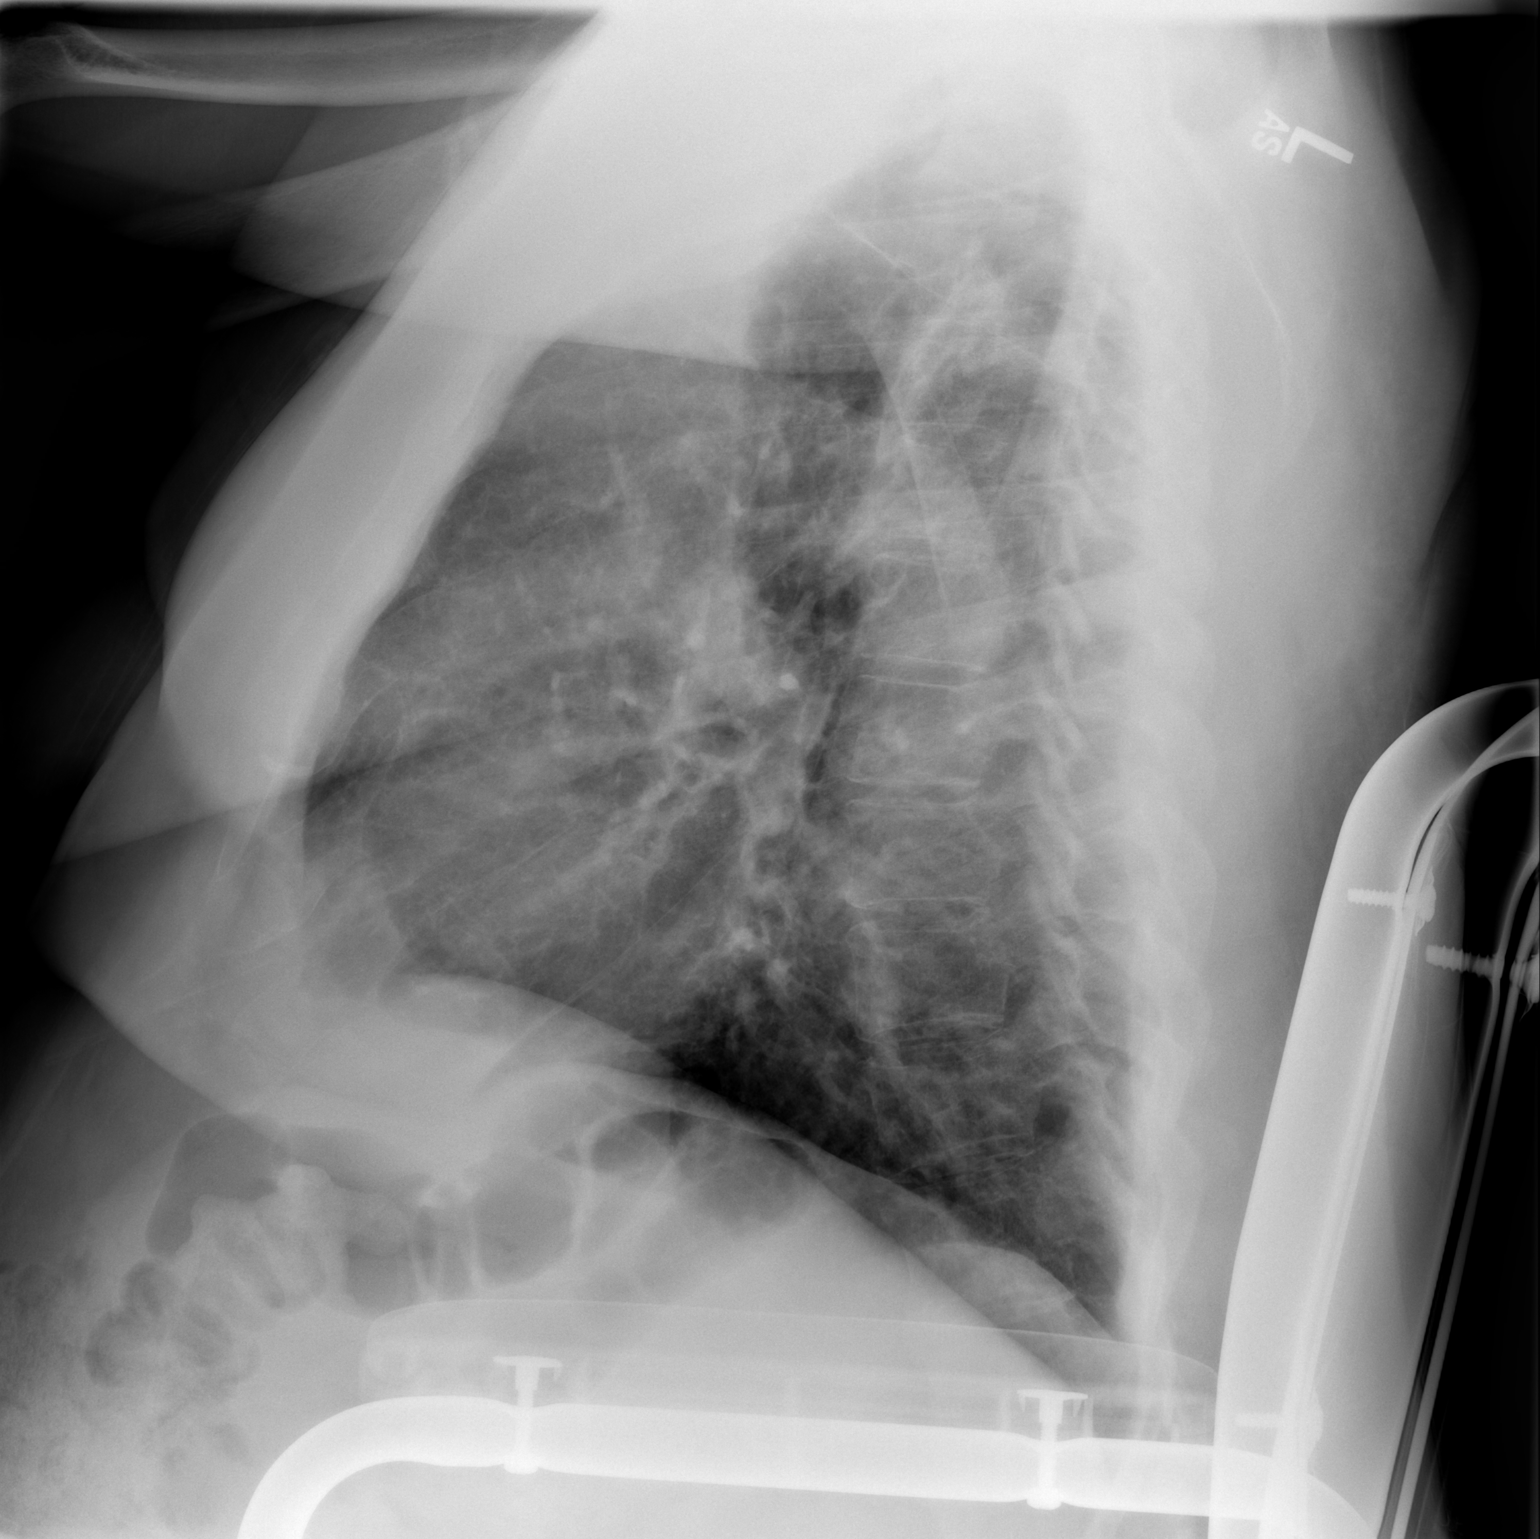

[view not recorded]
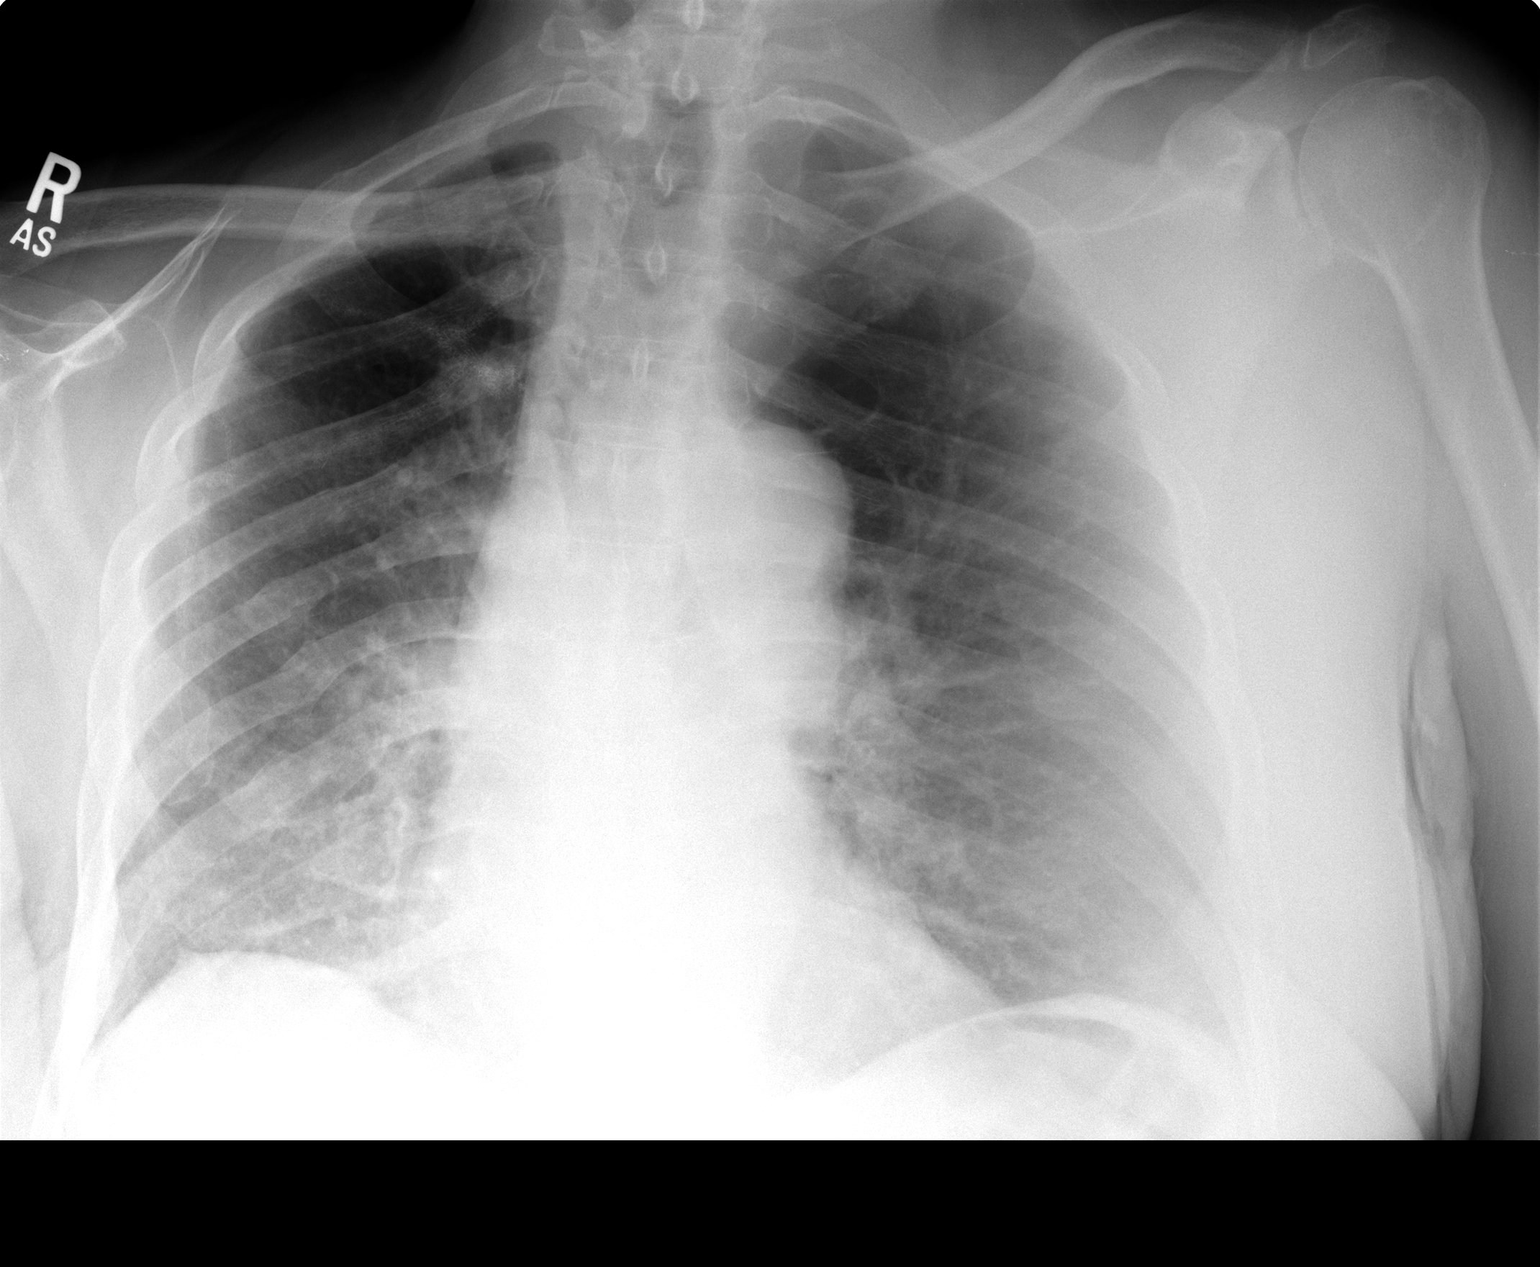

[2 of 2 positions shown; findings below may reference images not displayed]

FINDINGS: Stable right sided postoperative changes and old, healed
rib fractures.  No pneumothorax seen.  The lungs remain clear and
hyperexpanded with diffusely prominent interstitial markings.  The
heart remains normal in size and the aorta remains tortuous.
Thoracic lordosis is noted.
IMPRESSION: Stable changes of COPD.  No acute abnormality.

## 2011-01-02 ENCOUNTER — Encounter: Payer: Self-pay | Admitting: Thoracic Surgery

## 2011-01-13 NOTE — Assessment & Plan Note (Signed)
Summary: RECTAL BLEEDING/ABD PAIN/GU   Visit Type:  f/u Primary Care Provider:  [name], MD  Chief Complaint:  abd pain and rectal bleeding on occasion.  History of Present Illness: Patient is here for f/u.  We recently called him with DEXA Bone density scan results and he c/o intermittent rectal bleeding.  Dr. Gala Romney advised OV and CBC.  His H/H are normal.  He has h/o peripheral eosinophilia, chronic RUQ abd pain, transaminitis. We tried to refer him for second opinion at [facility] 6/09 but patient cancelled his appt, stating he was tired of all the test.    He had liver bx, 1/09 which showed steatohepatitis. MRCP 1/09 for abnormal lfts and ruq abd pain was normal. He has had negative viral markers, ANA, AMA, SMA. Normal alpha 1 antitrypsin, ceruloplasmin, iron. Ferritin has been elevated. 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 Last EGD 2/09, Barrett's esophagus, diffuse mottling of gastric mucosa, circular erosions in the bulb and second portion of duodenum. Bx showed no increased eosinophils from duodenum. There was some adenomatous tissue and peptic duodenitis. Stomach bx benign. No H. Pylori. Esophagus bx c/w Barrett's but no increased eosinophils.  EGD planned for 01/2011 for surveillance of Barrett's.  Patient c/o daily RUQ pain worse with meals. Denies vomiting, heartburn.  BM regular.  C/O blood in stool every few weeks.  May last for several days. No rectal pain. No wt loss.  No dysuria. No dysphagia.   Recent CBC show Hgb 14.3, Platelet Count 149,000, EOS 15%.  Current Medications (verified): 1)  Colchicine 0.6 Mg Tabs (Colchicine) .... Three Times A Day 2)  Hydrocodone-Acetaminophen 5-500 Mg Tabs (Hydrocodone-Acetaminophen) .... As Needed 3)  Actos 15 Mg Tabs (Pioglitazone Hcl) .... Once Daily 4)  Metoprolol Tartrate 50 Mg Tabs (Metoprolol Tartrate) .... Once Daily 5)  Allopurinol 300 Mg Tabs (Allopurinol) .... Once Daily 6)  Flomax 0.4  Mg Xr24h-Cap (Tamsulosin Hcl) .... Once Daily 7)  Amlodipine Besylate 10 Mg Tabs (Amlodipine Besylate) .... Once Daily 8)  Aciphex 20 Mg Tbec (Rabeprazole Sodium) .... Once Daily 9)  Hydrochlorothiazide 25 Mg Tabs (Hydrochlorothiazide) .... Once Daily 10)  Calcium 500 Mg Tabs (Calcium Carbonate) .... Once Daily 11)  Advil 200 Mg Tabs (Ibuprofen) .... 2 Three Times A Day As Needed 12)  Simvastatin 40 Mg Tabs (Simvastatin) .... Once Daily 13)  Glyburide 5 Mg Tabs (Glyburide) .... Two Times A Day 14)  Lantus 100 Unit/ml Soln (Insulin Glargine) .... 20 Units Two Times A Day  Allergies (verified): No Known Drug Allergies  Past History:  Past Surgical History: Last updated: 01/05/2009 cholecystectomy Right lymph node removed  Past Medical History: Barretts Esophagus, last EGD 2/09 H/O peripheral eosinophilia responded to prednisone previously.  ?eosinophilic hepatitis steatohepatitis Diabetes Diverticulosis GERD Hypertension Stroke, right sided deficit, 1998 Gout H/O lymphoma s/p XRT at [facility] Colonoscopy 5/08, pancolonic diverticula EGD 2/09, see HPI.  Family History: No FH of CRC, liver dz, chronic GI illness  Social History: Married. Disabled. One living son. One deceased secondary to MI. No tob,etoh,drug use.  Review of Systems      See HPI  Vital Signs:  Patient profile:   65 year old male Temp:     98.6 degrees F oral Pulse rate:   76 / minute BP sitting:   104 / 70  (left arm) Cuff size:   large  Vitals Entered By: Burnadette Peter LPN (December 23, 9620 1:49 PM)  Physical Exam  General:  Well developed, well nourished,  no acute distress. Head:  Normocephalic and atraumatic. Eyes:  No sclera icterus. Mouth:  OP moist. Lungs:  Clear throughout to auscultation. Heart:  Regular rate and rhythm; no murmurs, rubs,  or bruits. Abdomen:  Obese. Moderate RUQ tenderness but no guarding or rebound. No HSM or masses. No abd hernia or bruit.  Rectal:  Small skin tag  perianal area. No hemorrhoids noted. Tenderness noted with initial digital penetration. Stool brown, heme negative. Exam limited due to pt tenderness.  Impression & Recommendations:  Problem # 1:  HEMATOCHEZIA (ICD-578.1)  Intermittent hematachezia and rectal pain on exam.  ?anorectal fissure vs hemorrhoids.  Last TCS in 2008.  Treat with anusol suppositories.  H/H normal.  Orders: Est. Patient Level III (71062) Hemoccult Guaiac-1 spec.(in office) (82270)  Problem # 2:  RUQ PAIN (ICD-789.01) Chronic RUQ abd pain with peripheral eosinophilia.  Will recheck LFTs. He may need to have reimaging vs referral to [facility] as planned in 2009.  Await labs and discuss with Dr. Gala Romney. Orders: T-Lipase [phone]) T-Comprehensive Metabolic Panel ([id number]) Est. Patient Level III (99371)  Problem # 3:  ABNORMAL TRANSAMINASE, (LFT'S) (ICD-790.4)  See #2.  Orders: Est. Patient Level III (69678)  Problem # 4:  BARRETTS ESOPHAGUS (ICD-530.85)  Due surveillance 01/2011.  Orders: Est. Patient Level III (93810) Prescriptions: HYDROCORTISONE ACETATE 25 MG SUPP (HYDROCORTISONE ACETATE) one pr at bedtime for two weeks  #14 x 0   Entered and Authorized by:   Laureen Ochs. Bernarda Caffey   Signed by:   Laureen Ochs Gagandeep Kossman PA-C on 12/23/2009   Method used:   Print then Give to Patient   RxID:   [id number]

## 2011-01-28 ENCOUNTER — Encounter (INDEPENDENT_AMBULATORY_CARE_PROVIDER_SITE_OTHER): Payer: Self-pay

## 2011-02-02 NOTE — Letter (Signed)
Summary: Recall Colonoscopy/Endoscopy, Change to Office Visit  [facility]  [address]   [place], [place] [address]   Phone: [phone]  Fax: [phone]      January 28, 2011   [name] [address] [place], [place]  [address] 1946-12-12   Dear Mr. BHAKTA,   According to our records, it is time for you to schedule a Colonoscopy/Endoscopy. However, after reviewing your medical record, we recommend an office visit in order to determine your need for a repeat procedure.  Please call [phone] at your convenience to schedule an office visit. If you have any questions or concerns, please feel free to contact our office.   Sincerely,   Waldon Merl LPN  [facility] Ph: [phone]   Fax: [phone]

## 2011-02-23 ENCOUNTER — Ambulatory Visit: Payer: Self-pay | Admitting: Gastroenterology

## 2011-02-28 LAB — URINALYSIS, ROUTINE W REFLEX MICROSCOPIC
Ketones, ur: NEGATIVE mg/dL
Nitrite: NEGATIVE
Protein, ur: 30 mg/dL — AB

## 2011-02-28 LAB — GLUCOSE, CAPILLARY: Glucose-Capillary: 93 mg/dL (ref 70–99)

## 2011-03-02 ENCOUNTER — Encounter: Payer: Self-pay | Admitting: Gastroenterology

## 2011-03-02 ENCOUNTER — Ambulatory Visit (INDEPENDENT_AMBULATORY_CARE_PROVIDER_SITE_OTHER): Payer: Medicare Other | Admitting: Gastroenterology

## 2011-03-02 VITALS — BP 106/78 | HR 80 | Temp 98.0°F | Ht 73.0 in

## 2011-03-02 DIAGNOSIS — R7402 Elevation of levels of lactic acid dehydrogenase (LDH): Secondary | ICD-10-CM

## 2011-03-02 DIAGNOSIS — R1011 Right upper quadrant pain: Secondary | ICD-10-CM

## 2011-03-02 DIAGNOSIS — D721 Eosinophilia, unspecified: Secondary | ICD-10-CM

## 2011-03-02 DIAGNOSIS — K759 Inflammatory liver disease, unspecified: Secondary | ICD-10-CM

## 2011-03-02 DIAGNOSIS — R718 Other abnormality of red blood cells: Secondary | ICD-10-CM

## 2011-03-02 DIAGNOSIS — K7581 Nonalcoholic steatohepatitis (NASH): Secondary | ICD-10-CM | POA: Insufficient documentation

## 2011-03-02 DIAGNOSIS — K921 Melena: Secondary | ICD-10-CM

## 2011-03-02 DIAGNOSIS — K2270 Barrett's esophagus without dysplasia: Secondary | ICD-10-CM

## 2011-03-02 LAB — DIFFERENTIAL
Basophils Absolute: 0.1 10*3/uL (ref 0.0–0.1)
Basophils Relative: 1 % (ref 0–1)
Eosinophils Absolute: 0.5 10*3/uL (ref 0.0–0.7)
Eosinophils Absolute: 0.9 10*3/uL — ABNORMAL HIGH (ref 0.0–0.7)
Eosinophils Relative: 12 % — ABNORMAL HIGH (ref 0–5)
Lymphocytes Relative: 22 % (ref 12–46)
Lymphs Abs: 1.6 10*3/uL (ref 0.7–4.0)
Lymphs Abs: 1.7 10*3/uL (ref 0.7–4.0)
Monocytes Absolute: 0.7 10*3/uL (ref 0.1–1.0)
Monocytes Relative: 14 % — ABNORMAL HIGH (ref 3–12)
Neutro Abs: 4.3 10*3/uL (ref 1.7–7.7)
Neutro Abs: 5.1 10*3/uL (ref 1.7–7.7)
Neutrophils Relative %: 54 % (ref 43–77)
Neutrophils Relative %: 64 % (ref 43–77)

## 2011-03-02 LAB — CBC
HCT: 45.4 % (ref 39.0–52.0)
HCT: 38.6 % — ABNORMAL LOW (ref 39.0–52.0)
HCT: 39.7 % (ref 39.0–52.0)
HCT: 43.3 % (ref 39.0–52.0)
HCT: 45.7 % (ref 39.0–52.0)
Hemoglobin: 14.5 g/dL (ref 13.0–17.0)
Hemoglobin: 14 g/dL (ref 13.0–17.0)
Hemoglobin: 14.1 g/dL (ref 13.0–17.0)
Hemoglobin: 14.8 g/dL (ref 13.0–17.0)
MCHC: 31.4 g/dL (ref 30.0–36.0)
MCHC: 32.1 g/dL (ref 30.0–36.0)
MCHC: 32.3 g/dL (ref 30.0–36.0)
MCHC: 32.4 g/dL (ref 30.0–36.0)
MCHC: 32.7 g/dL (ref 30.0–36.0)
MCV: 76.3 fL — ABNORMAL LOW (ref 78.0–100.0)
MCV: 76.6 fL — ABNORMAL LOW (ref 78.0–100.0)
MCV: 76.7 fL — ABNORMAL LOW (ref 78.0–100.0)
MCV: 76.8 fL — ABNORMAL LOW (ref 78.0–100.0)
MCV: 77.2 fL — ABNORMAL LOW (ref 78.0–100.0)
MCV: 77.3 fL — ABNORMAL LOW (ref 78.0–100.0)
MCV: 77.5 fL — ABNORMAL LOW (ref 78.0–100.0)
Platelets: 172 10*3/uL (ref 150–400)
Platelets: 175 10*3/uL (ref 150–400)
Platelets: 223 10*3/uL (ref 150–400)
Platelets: 226 10*3/uL (ref 150–400)
Platelets: 234 10*3/uL (ref 150–400)
Platelets: 272 10*3/uL (ref 150–400)
RBC: 5.76 MIL/uL (ref 4.22–5.81)
RBC: 5.03 MIL/uL (ref 4.22–5.81)
RBC: 5.14 MIL/uL (ref 4.22–5.81)
RBC: 5.39 MIL/uL (ref 4.22–5.81)
RBC: 5.6 MIL/uL (ref 4.22–5.81)
RDW: 17.9 % — ABNORMAL HIGH (ref 11.5–15.5)
RDW: 17.4 % — ABNORMAL HIGH (ref 11.5–15.5)
RDW: 17.5 % — ABNORMAL HIGH (ref 11.5–15.5)
RDW: 17.6 % — ABNORMAL HIGH (ref 11.5–15.5)
WBC: 7.7 10*3/uL (ref 4.0–10.5)
WBC: 8 10*3/uL (ref 4.0–10.5)
WBC: 13.2 10*3/uL — ABNORMAL HIGH (ref 4.0–10.5)
WBC: 14 10*3/uL — ABNORMAL HIGH (ref 4.0–10.5)
WBC: 14.2 10*3/uL — ABNORMAL HIGH (ref 4.0–10.5)
WBC: 7.3 10*3/uL (ref 4.0–10.5)

## 2011-03-02 LAB — GLUCOSE, CAPILLARY
Glucose-Capillary: 166 mg/dL — ABNORMAL HIGH (ref 70–99)
Glucose-Capillary: 169 mg/dL — ABNORMAL HIGH (ref 70–99)
Glucose-Capillary: 174 mg/dL — ABNORMAL HIGH (ref 70–99)
Glucose-Capillary: 178 mg/dL — ABNORMAL HIGH (ref 70–99)
Glucose-Capillary: 194 mg/dL — ABNORMAL HIGH (ref 70–99)
Glucose-Capillary: 217 mg/dL — ABNORMAL HIGH (ref 70–99)
Glucose-Capillary: 234 mg/dL — ABNORMAL HIGH (ref 70–99)
Glucose-Capillary: 242 mg/dL — ABNORMAL HIGH (ref 70–99)
Glucose-Capillary: 246 mg/dL — ABNORMAL HIGH (ref 70–99)
Glucose-Capillary: 255 mg/dL — ABNORMAL HIGH (ref 70–99)
Glucose-Capillary: 267 mg/dL — ABNORMAL HIGH (ref 70–99)
Glucose-Capillary: 324 mg/dL — ABNORMAL HIGH (ref 70–99)
Glucose-Capillary: 110 mg/dL — ABNORMAL HIGH (ref 70–99)
Glucose-Capillary: 117 mg/dL — ABNORMAL HIGH (ref 70–99)
Glucose-Capillary: 134 mg/dL — ABNORMAL HIGH (ref 70–99)
Glucose-Capillary: 136 mg/dL — ABNORMAL HIGH (ref 70–99)
Glucose-Capillary: 137 mg/dL — ABNORMAL HIGH (ref 70–99)
Glucose-Capillary: 138 mg/dL — ABNORMAL HIGH (ref 70–99)
Glucose-Capillary: 143 mg/dL — ABNORMAL HIGH (ref 70–99)
Glucose-Capillary: 160 mg/dL — ABNORMAL HIGH (ref 70–99)
Glucose-Capillary: 160 mg/dL — ABNORMAL HIGH (ref 70–99)
Glucose-Capillary: 166 mg/dL — ABNORMAL HIGH (ref 70–99)
Glucose-Capillary: 185 mg/dL — ABNORMAL HIGH (ref 70–99)
Glucose-Capillary: 206 mg/dL — ABNORMAL HIGH (ref 70–99)
Glucose-Capillary: 212 mg/dL — ABNORMAL HIGH (ref 70–99)
Glucose-Capillary: 216 mg/dL — ABNORMAL HIGH (ref 70–99)
Glucose-Capillary: 234 mg/dL — ABNORMAL HIGH (ref 70–99)
Glucose-Capillary: 236 mg/dL — ABNORMAL HIGH (ref 70–99)
Glucose-Capillary: 241 mg/dL — ABNORMAL HIGH (ref 70–99)
Glucose-Capillary: 246 mg/dL — ABNORMAL HIGH (ref 70–99)
Glucose-Capillary: 256 mg/dL — ABNORMAL HIGH (ref 70–99)
Glucose-Capillary: 262 mg/dL — ABNORMAL HIGH (ref 70–99)
Glucose-Capillary: 289 mg/dL — ABNORMAL HIGH (ref 70–99)
Glucose-Capillary: 75 mg/dL (ref 70–99)
Glucose-Capillary: 96 mg/dL (ref 70–99)

## 2011-03-02 LAB — POCT I-STAT 3, ART BLOOD GAS (G3+)
O2 Saturation: 94 %
pCO2 arterial: 38.1 mmHg (ref 35.0–45.0)
pH, Arterial: 7.462 — ABNORMAL HIGH (ref 7.350–7.450)
pO2, Arterial: 66 mmHg — ABNORMAL LOW (ref 80.0–100.0)

## 2011-03-02 LAB — HEPATIC FUNCTION PANEL
Alkaline Phosphatase: 84 U/L (ref 39–117)
Bilirubin, Direct: 0.1 mg/dL (ref 0.0–0.3)
Indirect Bilirubin: 0.3 mg/dL (ref 0.0–0.9)
Total Bilirubin: 0.4 mg/dL (ref 0.3–1.2)

## 2011-03-02 LAB — BASIC METABOLIC PANEL
BUN: 13 mg/dL (ref 6–23)
BUN: 18 mg/dL (ref 6–23)
BUN: 12 mg/dL (ref 6–23)
BUN: 17 mg/dL (ref 6–23)
BUN: 18 mg/dL (ref 6–23)
CO2: 26 mEq/L (ref 19–32)
CO2: 25 mEq/L (ref 19–32)
CO2: 28 mEq/L (ref 19–32)
CO2: 31 mEq/L (ref 19–32)
Calcium: 10.3 mg/dL (ref 8.4–10.5)
Calcium: 10.3 mg/dL (ref 8.4–10.5)
Calcium: 10.3 mg/dL (ref 8.4–10.5)
Calcium: 9.3 mg/dL (ref 8.4–10.5)
Calcium: 9.8 mg/dL (ref 8.4–10.5)
Calcium: 9.8 mg/dL (ref 8.4–10.5)
Chloride: 100 mEq/L (ref 96–112)
Chloride: 102 mEq/L (ref 96–112)
Chloride: 104 mEq/L (ref 96–112)
Chloride: 96 mEq/L (ref 96–112)
Chloride: 98 mEq/L (ref 96–112)
Chloride: 99 mEq/L (ref 96–112)
Creatinine, Ser: 0.99 mg/dL (ref 0.4–1.5)
Creatinine, Ser: 1.24 mg/dL (ref 0.4–1.5)
Creatinine, Ser: 0.91 mg/dL (ref 0.4–1.5)
Creatinine, Ser: 0.92 mg/dL (ref 0.4–1.5)
Creatinine, Ser: 1.15 mg/dL (ref 0.4–1.5)
Creatinine, Ser: 1.17 mg/dL (ref 0.4–1.5)
GFR calc Af Amer: >60 mL/min (ref >60)
GFR calc Af Amer: >60 mL/min (ref >60)
GFR calc Af Amer: >60 mL/min (ref >60)
GFR calc Af Amer: >60 mL/min (ref >60)
GFR calc Af Amer: >60 mL/min (ref >60)
GFR calc non Af Amer: >60 mL/min (ref >60)
GFR calc non Af Amer: >60 mL/min (ref >60)
GFR calc non Af Amer: >60 mL/min (ref >60)
Glucose, Bld: 170 mg/dL — ABNORMAL HIGH (ref 70–99)
Glucose, Bld: 195 mg/dL — ABNORMAL HIGH (ref 70–99)
Glucose, Bld: 260 mg/dL — ABNORMAL HIGH (ref 70–99)
Glucose, Bld: 274 mg/dL — ABNORMAL HIGH (ref 70–99)
Glucose, Bld: 99 mg/dL (ref 70–99)
Potassium: 4.4 mEq/L (ref 3.5–5.1)
Potassium: 3.6 mEq/L (ref 3.5–5.1)
Potassium: 4.3 mEq/L (ref 3.5–5.1)
Potassium: 4.6 mEq/L (ref 3.5–5.1)
Sodium: 138 mEq/L (ref 135–145)
Sodium: 130 mEq/L — ABNORMAL LOW (ref 135–145)
Sodium: 131 mEq/L — ABNORMAL LOW (ref 135–145)

## 2011-03-02 LAB — PROTIME-INR
INR: 1.16 (ref 0.00–1.49)
Prothrombin Time: 14.7 seconds (ref 11.6–15.2)

## 2011-03-02 LAB — CROSSMATCH: Antibody Screen: NEGATIVE

## 2011-03-02 LAB — MRSA PCR SCREENING: MRSA by PCR: NEGATIVE

## 2011-03-02 LAB — COMPREHENSIVE METABOLIC PANEL
BUN: 14 mg/dL (ref 6–23)
CO2: 29 mEq/L (ref 19–32)
Chloride: 99 mEq/L (ref 96–112)
Creatinine, Ser: 1 mg/dL (ref 0.4–1.5)
GFR calc non Af Amer: >60 mL/min (ref >60)
Total Bilirubin: 0.6 mg/dL (ref 0.3–1.2)

## 2011-03-02 LAB — APTT: aPTT: 37 seconds (ref 24–37)

## 2011-03-02 LAB — POCT CARDIAC MARKERS: Troponin i, poc: <0.05 ng/mL (ref 0.00–0.09)

## 2011-03-02 MED ORDER — PEG 3350-KCL-NA BICARB-NACL 420 G PO SOLR: ORAL | Status: AC

## 2011-03-02 NOTE — Assessment & Plan Note (Signed)
Due for surveillance endoscopy given history of Barrett's esophagus diagnosed in 2009. I have discussed the risks, alternatives, benefits with regards to but not limited to the risk of reaction to medication, bleeding, infection, perforation and the patient is agreeable to proceed.

## 2011-03-02 NOTE — Patient Instructions (Addendum)
EGD and colonoscopy as planned. Please have your labwork done, we will call you with results.  PREPARATION FOR COLONOSCOPY WITH TRI-LYTE PREP  Please notify us immediately if you are diabetic, take iron supplements, or if you are on Coumadin or any other blood thinners.   Please hold the following medications:    PROCEDURE IS SCHEDULED FOR [name] AS FOLLOWS:  Procedure Date: 03/23/2011 Time to register: 8:45 am Place to register: [place] Stay Scheduled provider: R. Lianne Moris, MD   2 DAYS BEFORE PROCEDURE:  DATE: 03/21/2011   DAY: Monday Begin clear liquid diet AFTER your lunch meal. NO SOLID FOODS!   1 DAY BEFORE PROCEDURE:  DATE: 03/22/2011   DAY: Tuesday  Continue clear liquids the entire day - NO SOLID FOOD.   Diabetic medications adjustments for today: Take half of your Glyburide 2.3m two times daily,Take 10 units of your Lantus twice daily, Take half of your Actos.  At 12:00pm (noon): Take 2 (two) Dulcolax (Bisacodyl) tablets  At 2:00pm: Start drinking your solution. Try to drink 1 (one) 8 ounce glass every 10-15 minutes, or at a pace you are comfortable with, until you have consumed HALF the jug. Wait 30 minutes, then drink 3-4 more glasses of the solution. Your stools should be clear; if not, you may have to consume the rest of the jug.   One hour after completing the solution: take the last 2 (two) Dulcolax (Bisacodyl) tablets, with a clear liquid.  Continue clear liquids only, until midnight. Do not eat or drink anything after midnight. EXCEPTION: If you take medications for your heart, blood pressure or breathing, you may take these medications with a small amount of clear liquid.   YOU MUST DRINK PLENTY OF CLEAR LIQUIDS DURING YOUR PREP TO REDUCE RISKS OF KIDNEY FAILURE.    DAY OF PROCEDURE:   DATE: 03/23/2011       DAY: Wednesday The morning of your procedure give yourself 1 (one) Fleet Enema, at least 1 hour before going to the hospital.   You  may take Tylenol products. Please continue your regular medications unless we have instructed otherwise.   Someone MUST be available to drive you home; the hospital will cancel this appointment if you do not have a driver.   Please call the office if you have any questions (Dept: [phone].  Please see below for Dietary Information.  CLEAR LIQUIDS INCLUDE:  Water Jello (NOT red in color)   Ice Popsicles (NOT red in color)   Tea (sugar ok, no milk/cream) Powdered fruit flavored drinks  Coffee (sugar ok, no milk/cream) Gatorade/ Lemonade/ Kool-Aid  (NOT red in color)   Juice: apple, white grape, white cranberry Soft drinks  Clear bullion, consomme, broth (fat free beef/chicken/vegetable)  Carbonated beverages (any kind)  Strained chicken noodle soup Hard Candy   REMEMBER: Clear liquids are liquids that will allow you to see your fingers on the other side of a clear glass. Be sure liquids are NOT red in color, and not cloudy, but CLEAR.   DO NOT EAT OR DRINK ANY OF THE FOLLOWING:  Dairy products of any kind   Cranberry juice Tomato juice / V8 juice   Grapefruit juice Orange juice     Red grape juice  Do not eat any solid foods, including such foods as: cereal, oatmeal, yogurt, fruits, vegetables, creamed soups, eggs, bread, etc.    HELPFUL HINTS FOR DRINKING PREP SOLUTION:  ** Make sure prep is extremely cold. Refrigerate the night  before. You may also put in the freezer.  ** You may try mixing some Crystal Light or Country Time Lemonade if you prefer. Mix in small amounts; add more if necessary. ** Try drinking through a straw ** Rinse mouth with water or a mouthwash between glasses, to remove after-taste. ** Try sipping on a cold beverage /ice/ popsicles between glasses of prep ** Place a piece of sugar-free hard candy in mouth between glasses II If you become nauseated, try consuming smaller amounts, or stretch out the time between glasses. Stop for 30-60 minutes, then slowly  start back drinking    You may call the office (Dept: [phone]) before 5:00pm, or page the doctor on call after 5:00pm, for further instructions, if necessary.   OTHER INSTRUCTIONS  You will need a responsible adult at least 65 years of age to accompany you and drive you home. This person must remain in the waiting room during your procedure.  Wear loose fitting clothing that is easily removed.  Leave jewelry and other valuables at home. However, you may wish to bring a book to read or an iPod/MP3 player to listen to music as you wait for your procedure to start.  Remove all body piercing jewelry and leave at home.  Total time from sign-in until discharge is approximately 2-3 hours.  You should go home directly after your procedure and rest. You can resume normal activities the day after your procedure.  The day of your procedure you should not:  Drive  Make legal decisions  Operate machinery  Drink alcohol  Return to work

## 2011-03-02 NOTE — Assessment & Plan Note (Signed)
Last colonoscopy nearly 4 years ago. He continues to have frequent hematochezia. Recommend colonoscopy for further evaluation. I have discussed the risks, alternatives, benefits with regards to but not limited to the risk of reaction to medication, bleeding, infection, perforation and the patient is agreeable to proceed.

## 2011-03-02 NOTE — Progress Notes (Signed)
History of present illness:     [name] is a pleasant 65 year old African American gentleman who presents today for further evaluation of abdominal pain , Barrett's esophagus, rectal bleeding. He is due for surveillance EGD given his history of Barrett's esophagus. His last EGD was in February of 2009. At that time he had diffuse mottling of the gastric mucosa, circular erosions in the bulb and second portion of the duodenum. Biopsy showed no increased eosinophils from the duodenum. There was some adenomatous tissue and peptic duodenitis. Stomach biopsy was benign. There H. Pylori. Esophagus biopsy was consistent with Barrett's but no increased eosinophils. At his last office visit we were planning on sending him for tertiary care referral regarding possibility of hepatitis related to his eosinophilia. He has a several year history of intermittent hepatitis with right upper quadrant abdominal pain, elevated transaminases, peripheral eosinophilia which has responded to prednisone. In January 2009 he had a liver biopsy which showed steatohepatitis. Workup also included an MRCP in January of 09 for abnormal LFTs and right upper corner abdominal pain which was normal. He had negative viral markers, ANA, AMA, SMA. Normal alpha one antitrypsin, ceruloplasmin, iron. History of elevated ferritin in the past. At this time he states his heartburn is well controlled on PPI. No dysphagia. No n/v. Mild epigastric pain. BM regular. Intermittent brbpr. Sees bright red blood in depends. Decreased appetite.   Current Outpatient Prescriptions  Medication Sig Dispense Refill  . allopurinol (ZYLOPRIM) 300 MG tablet Take 300 mg by mouth daily.        [name] amLODipine (NORVASC) 10 MG tablet Take 10 mg by mouth daily.        . cloNIDine (CATAPRES) 0.2 MG tablet Take 0.2 mg by mouth 3 (three) times daily.        . febuxostat (ULORIC) 40 MG tablet Take 40 mg by mouth daily.        [name] glyBURIDE (DIABETA) 5 MG tablet Take 5 mg  by mouth 2 (two) times daily with a meal.       . hydrochlorothiazide 25 MG tablet Take 25 mg by mouth daily.        . insulin glargine (LANTUS) 100 UNIT/ML injection Inject 20 Units into the skin 2 (two) times daily.        [name] lisinopril (PRINIVIL,ZESTRIL) 20 MG tablet Take 20 mg by mouth daily.        . metoprolol (TOPROL-XL) 50 MG 24 hr tablet Take 50 mg by mouth daily.        [name] omeprazole (PRILOSEC) 40 MG capsule Take 40 mg by mouth daily.        . pioglitazone (ACTOS) 15 MG tablet Take 15 mg by mouth daily.        . simvastatin (ZOCOR) 20 MG tablet Take 20 mg by mouth at bedtime.        . Tamsulosin HCl (FLOMAX) 0.4 MG CAPS Take 0.4 mg by mouth daily after breakfast.        . polyethylene glycol-electrolytes (TRILYTE) 420 G solution Use as directed Also buy 1 fleet enema & 4 dulcolax tablets to use as directed  4000 mL  0   No Known Allergies  Past Medical History  Diagnosis Date  . Barrett's esophagus     EGD 2/09 bx proven  . Steatohepatitis   . DM (diabetes mellitus)   . Diverticulosis     TCS 5/08, pancolonic diverticula  . GERD (gastroesophageal reflux disease)   . HTN (hypertension)   .  CVA (cerebral infarction) 1998    right sided deficit  . Gout   . Lymphoma     XRT at [facility]  . Hepatitis     esosiniphilic, tx with prednisone   Past Surgical History  Procedure Date  . Cholecystectomy   . Right lymph node removal   . Right video-assisted thoracic surgery, pleurectomy, and pleurodesis 2011   Family History:  No family history of colorectal cancer, liver disease, chronic GI illnesses.  Social History:   reports that he quit smoking about 14 years ago. His smoking use included Cigarettes. He smoked 3 packs per day. He has never used smokeless tobacco. He reports that he does not drink alcohol or use illicit drugs.  ROS:   General: Negative for anorexia weight loss fever chills fatigue weakness.  Eyes: Negative for vision changes.   ENT: Negative for hoarseness,  difficulty swallowing , nasal congestion.  CV: Negative for chest pain, angina, palpitations, dyspnea on exertion, peripheral edema.   Respiratory: Negative for dyspnea at rest, dyspnea on exertion, cough, sputum, wheezing.   GI: See history of present illness.  GU:  Negative for dysuria, hematuria, urinary incontinence, urinary frequency, nocturnal urination.   MS: Negative for joint pain, low back pain.   Derm: Negative for rash or itching.   Neuro: Negative for weakness, abnormal sensation, seizure, frequent headaches, memory loss, confusion.   Psych: Negative for anxiety, depression, suicidal ideation, hallucinations.   Endo: Negative for unusual weight change.   Heme: Negative for bruising or bleeding.  Allergy: Negative for rash or hives.  Physical Examination:   General: Well-nourished, well-developed in no acute distress.   Head: Normocephalic, atraumatic.    Eyes: Conjunctiva pain, no icterus.  Mouth: Oropharyngeal mucosa moist and peak , no lesions erythema or exudate.  Neck: Supple without thyromegaly, masses, or lymphadenopathy.   Lungs: Clear to auscultation bilaterally.   Heart: Regular rate and rhythm, no murmurs rubs or gallops.   Abdomen: Bowel sounds are normal, nontender , nondistended , no hepatosplenomegaly or masses, no              abdominal bruits or hernia , no rebound or guarding.    Extremities: No lower extremity edema.   Neuro: Alert and oriented x 4 , grossly normal neurologically.   Skin: Warm and dry, no rash or jaundice.    Psych: Alert and cooperative, normal mood and affect.

## 2011-03-02 NOTE — Assessment & Plan Note (Signed)
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 Last year during hospitalization his LFTs were normal. He had an abdominal ultrasound last fall which was unremarkable with regards to his liver. Will repeat his labs at this time. He does continue to have some intermittent right upper quadrant pain which tends to go with his transaminitis and peripheral eosinophilia.

## 2011-03-02 NOTE — Assessment & Plan Note (Signed)
Repeat CBC with differential at this time.

## 2011-03-02 NOTE — Assessment & Plan Note (Signed)
Persistent right upper quadrant abdominal pain, Will recheck CBC and LFTs at this time.

## 2011-03-03 LAB — CBC WITH DIFFERENTIAL/PLATELET
Basophils Relative: 1 % (ref 0–1)
Eosinophils Absolute: 0.6 10*3/uL (ref 0.0–0.7)
HCT: 44.6 % (ref 39.0–52.0)
Hemoglobin: 14.4 g/dL (ref 13.0–17.0)
Lymphs Abs: 1.8 10*3/uL (ref 0.7–4.0)
MCH: 24.2 pg — ABNORMAL LOW (ref 26.0–34.0)
MCHC: 32.3 g/dL (ref 30.0–36.0)
Monocytes Absolute: 0.4 10*3/uL (ref 0.1–1.0)
Monocytes Relative: 5 % (ref 3–12)

## 2011-03-06 NOTE — Progress Notes (Signed)
Addended by: Neil Crouch on: 03/06/2011 04:03 PM   Modules accepted: Orders

## 2011-03-09 NOTE — Progress Notes (Signed)
Lab order on file

## 2011-03-23 ENCOUNTER — Encounter: Payer: Medicare Other | Admitting: Internal Medicine

## 2011-03-23 ENCOUNTER — Other Ambulatory Visit: Payer: Self-pay | Admitting: Internal Medicine

## 2011-03-23 ENCOUNTER — Ambulatory Visit (HOSPITAL_COMMUNITY)
Admission: RE | Admit: 2011-03-23 | Discharge: 2011-03-23 | Disposition: A | Discharge status: Home / Self Care | Payer: Medicare Other | Source: Ambulatory Visit | Attending: Internal Medicine | Admitting: Internal Medicine

## 2011-03-23 DIAGNOSIS — K449 Diaphragmatic hernia without obstruction or gangrene: Secondary | ICD-10-CM | POA: Insufficient documentation

## 2011-03-23 DIAGNOSIS — E119 Type 2 diabetes mellitus without complications: Secondary | ICD-10-CM | POA: Insufficient documentation

## 2011-03-23 DIAGNOSIS — I10 Essential (primary) hypertension: Secondary | ICD-10-CM | POA: Insufficient documentation

## 2011-03-23 DIAGNOSIS — K648 Other hemorrhoids: Secondary | ICD-10-CM | POA: Insufficient documentation

## 2011-03-23 DIAGNOSIS — K2270 Barrett's esophagus without dysplasia: Secondary | ICD-10-CM

## 2011-03-23 DIAGNOSIS — K5730 Diverticulosis of large intestine without perforation or abscess without bleeding: Secondary | ICD-10-CM

## 2011-03-23 DIAGNOSIS — D126 Benign neoplasm of colon, unspecified: Secondary | ICD-10-CM

## 2011-03-23 DIAGNOSIS — Z794 Long term (current) use of insulin: Secondary | ICD-10-CM | POA: Insufficient documentation

## 2011-03-23 DIAGNOSIS — K921 Melena: Secondary | ICD-10-CM | POA: Insufficient documentation

## 2011-03-23 DIAGNOSIS — Z79899 Other long term (current) drug therapy: Secondary | ICD-10-CM | POA: Insufficient documentation

## 2011-03-23 DIAGNOSIS — K635 Polyp of colon: Secondary | ICD-10-CM

## 2011-03-23 HISTORY — PX: COLONOSCOPY: SHX174

## 2011-03-23 HISTORY — PX: ESOPHAGOGASTRODUODENOSCOPY: SHX1529

## 2011-03-23 HISTORY — DX: Polyp of colon: K63.5

## 2011-03-23 HISTORY — DX: Other hemorrhoids: K64.8

## 2011-03-23 LAB — URINALYSIS, ROUTINE W REFLEX MICROSCOPIC
Glucose, UA: >1000 mg/dL — AB
Leukocytes, UA: NEGATIVE
Nitrite: NEGATIVE
Protein, ur: 30 mg/dL — AB
pH: 5.5 (ref 5.0–8.0)

## 2011-03-23 LAB — CK TOTAL AND CKMB (NOT AT ARMC): Relative Index: 2.2 (ref 0.0–2.5)

## 2011-03-23 LAB — DIFFERENTIAL
Lymphocytes Relative: 23 % (ref 12–46)
Lymphs Abs: 1.6 10*3/uL (ref 0.7–4.0)
Neutro Abs: 4.4 10*3/uL (ref 1.7–7.7)
Neutrophils Relative %: 63 % (ref 43–77)

## 2011-03-23 LAB — COMPREHENSIVE METABOLIC PANEL
BUN: 17 mg/dL (ref 6–23)
CO2: 22 mEq/L (ref 19–32)
Calcium: 10.7 mg/dL — ABNORMAL HIGH (ref 8.4–10.5)
Creatinine, Ser: 1.45 mg/dL (ref 0.4–1.5)
GFR calc Af Amer: 60 mL/min — ABNORMAL LOW (ref >60)
GFR calc non Af Amer: 49 mL/min — ABNORMAL LOW (ref >60)
Glucose, Bld: 273 mg/dL — ABNORMAL HIGH (ref 70–99)

## 2011-03-23 LAB — GLUCOSE, CAPILLARY: Glucose-Capillary: 128 mg/dL — ABNORMAL HIGH (ref 70–99)

## 2011-03-23 LAB — CBC
HCT: 49.5 % (ref 39.0–52.0)
Hemoglobin: 15.7 g/dL (ref 13.0–17.0)
MCHC: 31.7 g/dL (ref 30.0–36.0)
MCV: 76.6 fL — ABNORMAL LOW (ref 78.0–100.0)
RBC: 6.47 MIL/uL — ABNORMAL HIGH (ref 4.22–5.81)

## 2011-03-23 LAB — URINE MICROSCOPIC-ADD ON

## 2011-03-28 NOTE — Op Note (Signed)
NAME:  [name], [name] NO.:  [id number]  MEDICAL RECORD NO.:  [id number]           PATIENT TYPE:  O  LOCATION:  DAYP                          FACILITY:  [facility]  PHYSICIAN:  R. Garfield Cornea, M.D. DATE OF BIRTH:  [birth year]  DATE OF PROCEDURE:  03/23/2011 DATE OF DISCHARGE:                              OPERATIVE REPORT   INDICATIONS FOR PROCEDURE:  A 65 year old gentleman with history of Barrett esophagus now due for surveillance hematochezia being worked up now with colonoscopy at the time of EGD, history of elevated transaminases with normal LFTs on March 02, 2011 assay.  Also, CBC revealed a normal H and H, but MCV is 75 recently.  EGD and colonoscopy is now being done.  Risks, benefits, limitations, alternatives and imponderables have been discussed, questions answered.  Please see the documentation in the medical record.  PROCEDURE NOTE:  O2 saturation, blood pressure, pulse and respirations were monitored throughout the entire procedure.  CONSCIOUS SEDATION: 1. Versed 5 mg IV. 2. Demerol 100 mg IV in divided doses.  Cetacaine spray for topical     pharyngeal anesthesia.  INSTRUMENT:  Pentax video chip system.  FINDINGS:  EGD examination of the tubular esophagus revealed two small tongues of salmon-colored epithelium coming up in the distal esophagus from the GE junction proximally 2-3 cm in length.  There was a 5-mm "island of salmon-colored epithelium just proximal to one of these tongues."  Please see photos.  Similar appearance to that seen previously.  No esophagitis, no tumor seen.  EGD junction easily traversed.  Stomach:  Gastric cavity was emptied and insufflated well with air.  Thorough examination of the gastric mucosa including retroflexed view of the proximal stomach, esophagogastric junction demonstrated only a small hiatal hernia and some patchy erythema of the antrum.  Pylorus was patent and easily traversed.  Examination of the bulb  and second portion revealed no abnormalities.  THERAPEUTIC/DIAGNOSTIC MANEUVERS PERFORMED:  Biopsies of salmon-colored epithelium were taken for histologic study.  The patient tolerated the procedure well and was prepared for colonoscopy.  Digital rectal exam revealed no abnormalities.  Endoscopic findings:  Prep was marginal to poor with some semi-formed stool and viscous liquid stool throughout the colon which made the exam more difficult.  The patient's colon was elongated and tortuous requiring a number of maneuvers including changing in the patient's position and external bowel pressures to the cecum.  Colon:  Colonic mucosa was surveyed from the rectosigmoid junction through the left transverse right colon to the appendiceal orifice, ileocecal valve/cecum.  These structures were well seen and photographed for the record.  From this level, scope was slowly and cautiously withdrawn.  All previously mentioned mucosal surfaces were again seen.  The patient had pancolonic diverticula.  A 8-mm polyp in the base of the cecum that was hot snare removed.  The remainder of the colonic mucosa appeared normal.  However, smaller lesion may have been obscured by the poor prep.  Scope was pulled down into the rectum, where a thorough examination of the rectal mucosa including retroflexed view of the anal verge on en face view of  the anal canal demonstrates the anal canal hemorrhoids only.  The patient tolerated the procedure well. Cecal withdrawal time 10 minutes.  IMPRESSION: 1. Salmon-colored epithelium in the distal esophagus consistent with     prior diagnosis of Barrett esophagus, status post biopsy. 2. Small hiatal hernia, patchy antral erythema of uncertain     significance, otherwise normal stomach D1, D2 colonoscopy findings.     Anal canal hemorrhoids, otherwise normal rectum. 3. Long tortuous redundant colon, pancolonic diverticula, cecal polyp     status post snare polypectomy.   Poor prep compromised exam.  RECOMMENDATIONS: 1. Continue omeprazole 20 mg orally daily. 2. Followup on path. 3. Polyp and diverticulosis literature provided to Mr. Sisney and also     hemorrhoid literature provided. 4. Daily fiber supplement such as Metamucil, Citrucel and Benefiber. 5. A 10-day course of Anusol-HC suppositories one per rectum at     bedtime. 6. Further recommendations to follow pending review of path.     Bridgette Habermann, M.D.     RMR/MEDQ  D:  03/23/2011  T:  03/24/2011  Job:  [id number]  cc:   Barrie Folk. Berdine Addison, MD Fax: [phone]  Electronically Signed by Jannette Spanner M.D. on 03/28/2011 12:34:56 PM

## 2011-04-03 ENCOUNTER — Encounter: Payer: Self-pay | Admitting: Internal Medicine

## 2011-04-17 IMAGING — US US ABDOMEN COMPLETE
1 series · 13 of 25 positions shown · non-contrast
Comparison: MRCP 12/21/2007

Addendum Begins

In the right kidney section, the second sentence should read
Addendum Ends
CLINICAL DATA: Abdominal pain.  History of cholecystectomy 5 years
ago.
COMPLETE ABDOMINAL ULTRASOUND

[Series 1: us abdomen complete · 0.30mm/px · 13 of 72 slices shown]
[im 1/72]
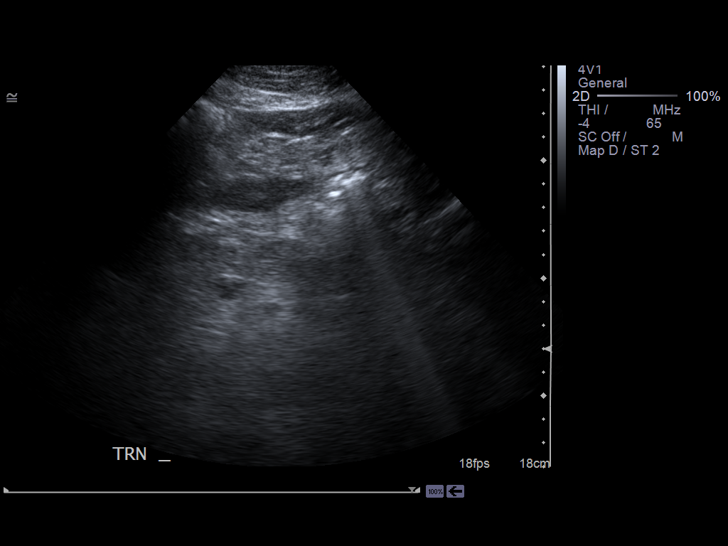
[im 6/72]
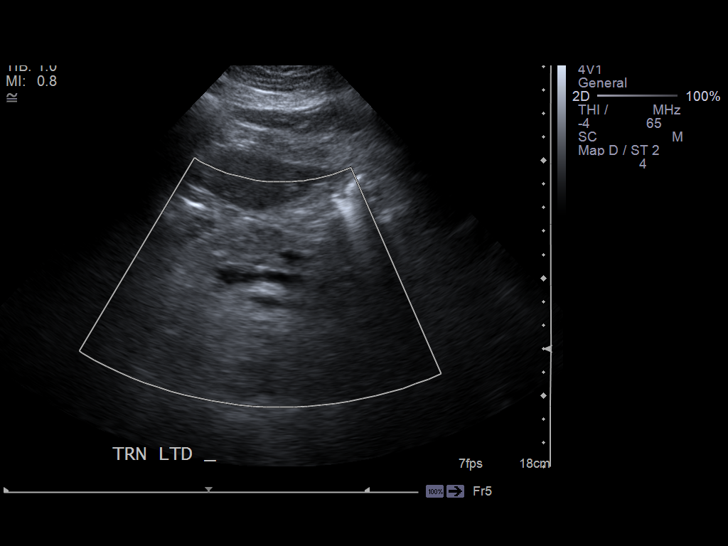
[im 12/72]
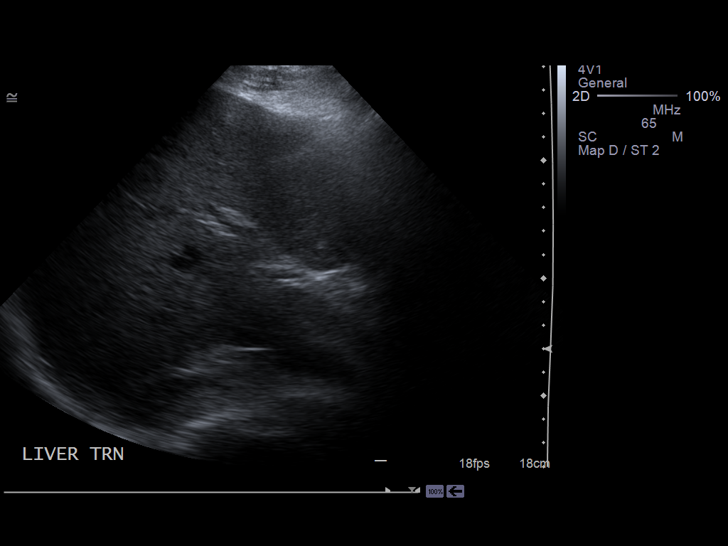
[im 18/72]
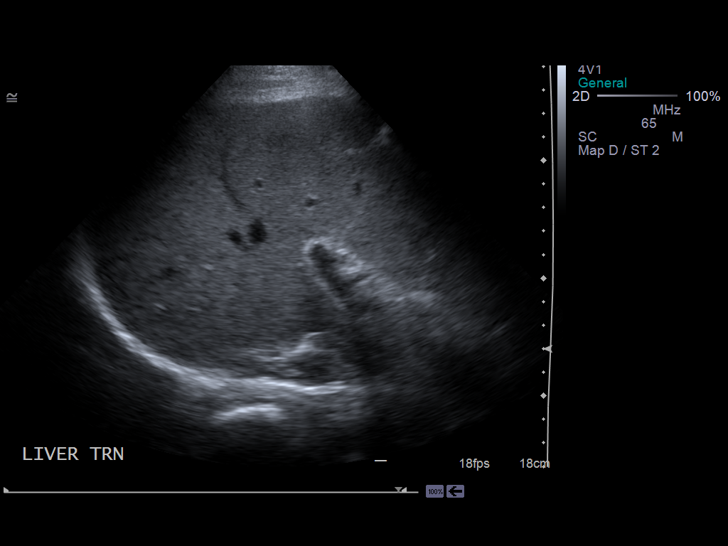
[im 24/72]
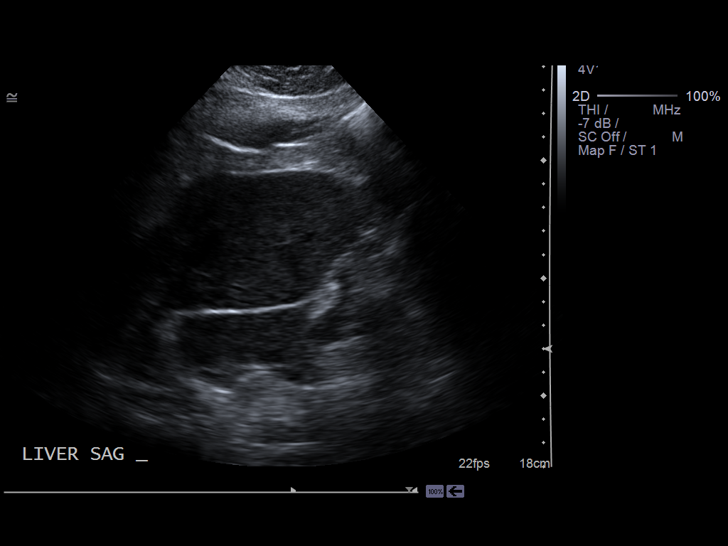
[im 30/72]
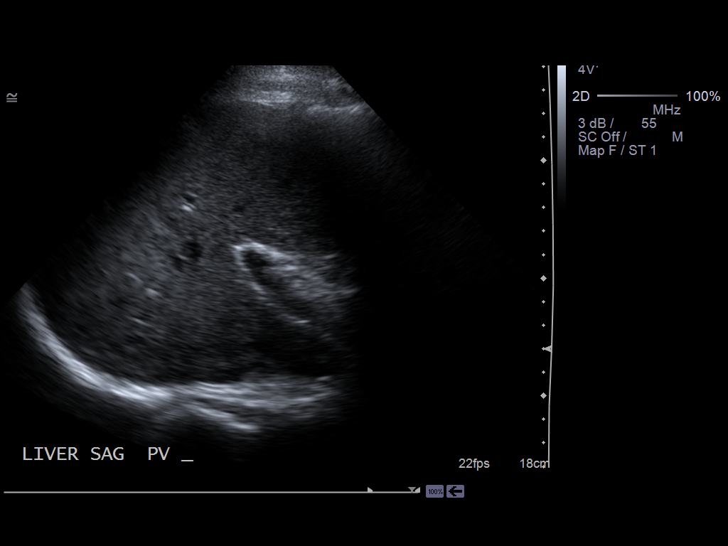
[im 36/72]
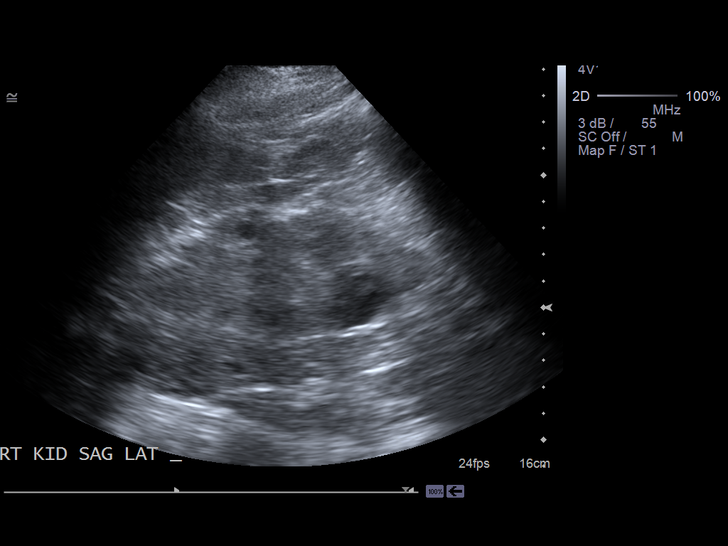
[im 42/72]
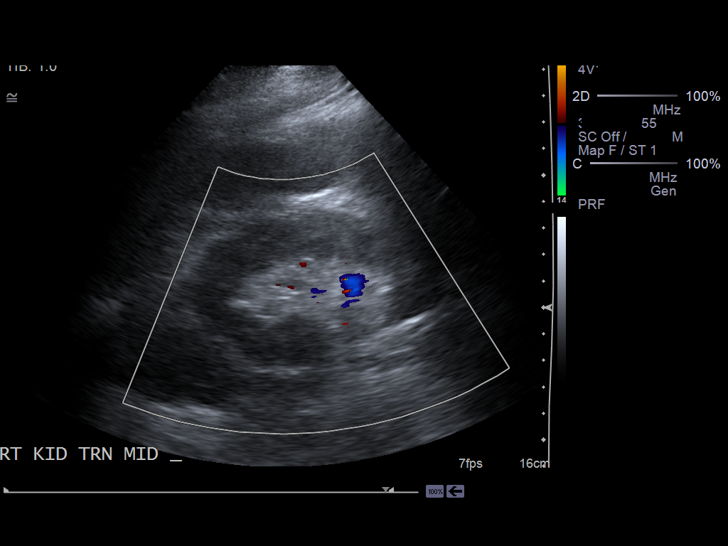
[im 48/72]
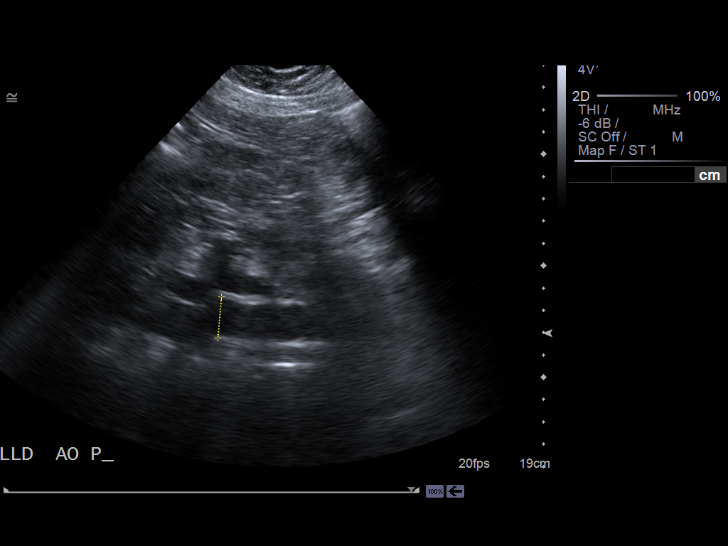
[im 54/72]
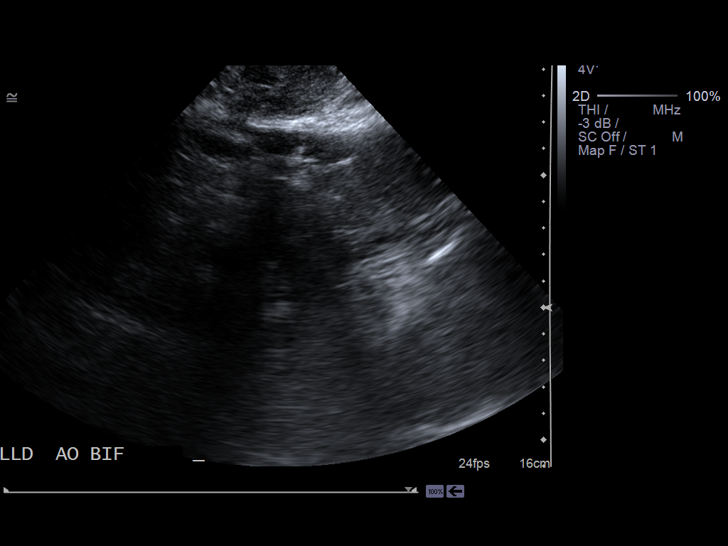
[im 60/72]
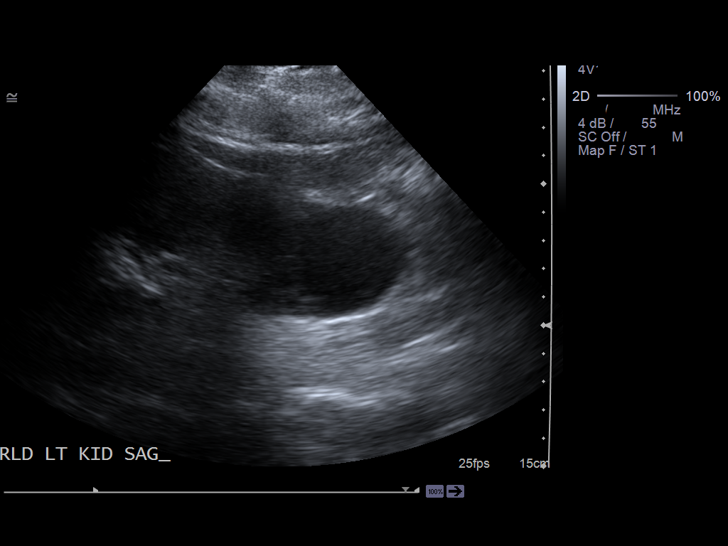
[im 66/72]
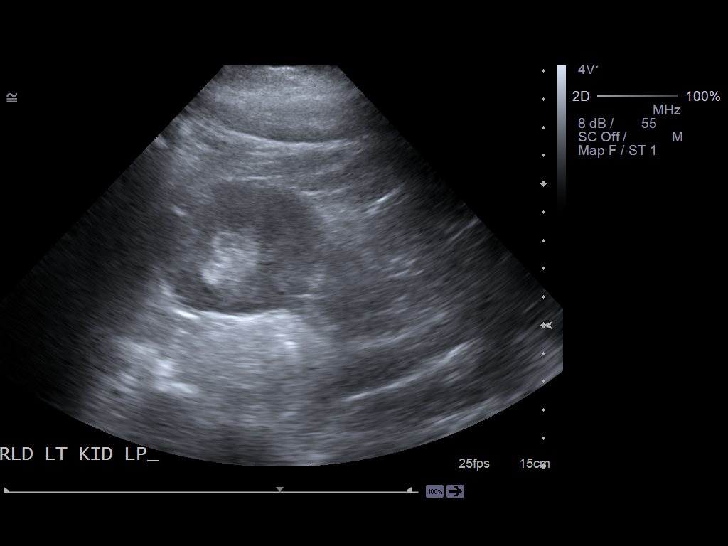
[im 72/72]
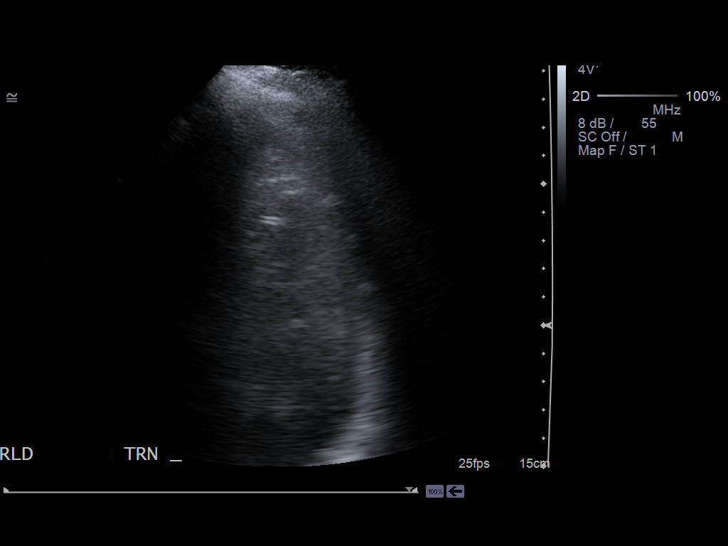

[13 of 25 positions shown; findings below may reference images not displayed]

FINDINGS: Gallbladder:  Surgically absent

Common bile duct: Normal, 6 mm.

Liver: Left liver lobe cyst 1.1 cm.

IVC: Visualized

Pancreas:  Poorly visualized due to overlying bowel gas.

Spleen:  Normal in size and echogenicity.

Right Kidney:  12.0 cm. No hydronephrosis.  Numerous hypoechoic
liver lesions.  The largest is in the inter/lower pole region and
measures 2.4 x 2.7 x 2.4 cm.  This is hypoechoic with enhanced
through transmission.

Left Kidney:  14.0 cm. No hydronephrosis.  The dominant inter/lower
pole left sided renal lesion measures 4.7 x 4.0 x 3.9 cm.  This is
anechoic to hypoechoic with enhanced through transmission.

Abdominal aorta:  Partially obscured by bowel gas.  No aneurysm.
No ascites.
IMPRESSION: 1.  Status post cholecystectomy without biliary ductal dilatation
or other explanation for right upper quadrant pain.
2.  Bilateral renal lesions.  Favored to represent cysts or
minimally complex cysts.  On the MRI of 12/21/2007, bilateral renal
cysts were identified.  Consider ultrasound follow-up at
approximately 6 months to confirm size stability of these lesions.
3.  Mildly degraded due to overlying bowel gas.

## 2011-04-26 NOTE — H&P (Signed)
NAME:  [name], [name]                ACCOUNT NO.:  [id number]   MEDICAL RECORD NO.:  [id number]          PATIENT TYPE:  AMB   LOCATION:  DAY                           FACILITY:  [facility]   PHYSICIAN:  R. Garfield Cornea, M.D. DATE OF BIRTH:  1946/04/06   DATE OF ADMISSION:  DATE OF DISCHARGE:  LH                              HISTORY & PHYSICAL   PRIMARY CARE PHYSICIAN:  Tesfaye D. Legrand Rams, MD.   CHIEF COMPLAINT:  Persistent chronic right upper quadrant  pain/transaminitis/history of eosinophilia.   HISTORY OF PRESENT ILLNESS:  [name] is a 65 year old male.  He has  been extensively evaluated by Dr. Gala Romney.  He has history of  steatohepatitis and elevated LFTs.  He underwent liver biopsy and  December 31, 2007.  He has had chronic upper abdominal pain, mostly right  upper quadrant pain.  He is status post cholecystectomy.  He does have  history of Barrett's esophagus.  He had an MRCP on December 21, 2007,  which was normal.  He had negative hepatitis B and C serologies.  He had  a CT scan on December 12, 2007.  He was found have a 2 cm nonobstructing  right kidney stone and fullness of the adrenal glands.  His last AST was  99, ALT 148.  He has had an elevated ferritin of 589, a normal iron and  iron studies and normal alpha 1 antitrypsin and normal ceruloplasmin,  negative ANA and AMA as well as SMA.  Last EGD was Apr 30, 2007.  He had  diffuse mottling of the gastric mucosa.  No infiltrative process or  ulcer.  He did have Barrett's esophagus which was biopsied without  dysplasia.  Colonoscopy demonstrated pan colonic diverticula.  He  continues to have sharp constant right upper quadrant pain.  It is worse  postprandially.  He denies any vomiting, rare nausea.  His appetite is  good.  His weight has remained stable.  He denies any heartburn or  indigestion.  He is taking Aciphex 20 mg daily.  He also admits to  taking Naprosyn one to two b.i.d. as well as Advil 400 mg t.i.d. on a  regular  basis for joint pain almost every day.  He has been found to  have peripheral eosinophilia and was even treated with a course of  prednisone last year.   PAST MEDICAL/SURGICAL HISTORY:  1. Diabetes.  2. Gout.  3. CVA.  4. Hypertension.  5. Barrett's esophagus, EGD Apr 30, 2007.  6. Diffuse subtle leiomyoma gastric mucosa.  Colonoscopy demonstrated      pancolonic diverticula, chronic abdominal pain, history of      peripheral eosinophilia and treatment with prednisone back in      December of last year.  7. Cholecystectomy in 2000.  8. Right lymph node removed with a history of lymphoma status post      radiation at [facility].  9. History of right sided deficit from CVA in 1998.   CURRENT MEDICATIONS:  1. Lipitor 20 mg daily.  2. Colchicine 0.6 mg b.i.d.  3. Glyburide/metformin 2.5/500  mg b.i.d.  4. Hydrocodone 5/500 mg q.6 h p.r.n.  5. Aspirin 325 mg daily.  6. Actos 50 mg daily .  7. Metoprolol 50 mg daily.  8. Allopurinol 3 mg daily.  9. Catapres 0.2 mg patch q. week.  10.Flomax 0.4 mg daily.  11.Amlodipine 10 mg daily.  12.Aciphex 20 mg daily.  13.Hydrochlorothiazide 25 mg daily.  14.Calcium once daily.  15.Capoten 25 mg daily.  16.Naproxen one to two daily p.r.n.  17.Advil 200-400 mg t.i.d. p.r.n.   ALLERGIES:  NO KNOWN DRUG ALLERGIES.   FAMILY HISTORY:  No known family history of colorectal carcinoma or  chronic GI problems.   SOCIAL HISTORY:  He is married.  He is disabled.  He has one healthy  son, one son deceased secondary to coronary artery disease.  Denies any  tobacco, alcohol or drug use.   REVIEW OF SYSTEMS:  See HPI, otherwise negative.   PHYSICAL EXAMINATION:  VITAL SIGNS:  Height 73 inches, temperature 98.4,  blood pressure 110/78, pulse 72.  Weight:  The patient is wheelchair-  bound.  GENERAL:  [name] is an elderly African American male who is alert,  oriented, pleasant and cooperative in no acute distress.  He is   accompanied by his son today.  He is wheelchair bound.  HEENT:  Sclerae are clear, nonicteric.  Conjunctiva pink.  Oropharynx  pink and moist without any lesions.  NECK: Supple without thyromegaly.  HEART:  Regular rate and rhythm.  Normal B8-G6 no murmurs, clicks, rubs  or gallops.  LUNGS:  Clear to auscultation with decreased breath sounds bilaterally.  ABDOMEN:  Positive bowel sounds x4.  No bruits auscultated.  Soft,  nontender, nondistended without palpable mass or hepatosplenomegaly.  No  rebound tenderness or guarding.  Exam is limited given the fact patient  is wheelchair-bound.  EXTREMITIES:  He has 2+ ankle edema bilaterally.   IMPRESSION:  [name] is a 65 year old male with chronic right upper  quadrant pain as well as peripheral eosinophilia and has previously been  treated with prednisone.  Last EGD back in May 2008 had mottled gastric  mucosa.  This could very well be related to his significant NSAID use.  Also with query eosinophilic gastroenteritis.  He also has  steatohepatitis with transit pneumonitis.  Etiology of his right upper  quadrant pain is obscure and sphincter dysfunction should remain in the  differential as well.   PLAN:  1. Would check LFTs and CBC.  2. He is to follow up with Dr. Legrand Rams regarding better control of his      blood sugars.  3. He is to hold the naproxen and Advil for now until procedures were      done.  4. He may require use of different pain medication.   ADDENDUM:  Laboratory studies were returned on January 30, 2008.  AST  was noted to be 126, ALT 191.  He still has peripheral eosinophilia with  an eos count of 900 and 13%.      Vickey Huger, N.P.      Bridgette Habermann, M.D.  Electronically Signed    KJ/MEDQ  D:  01/30/2008  T:  01/30/2008  Job:  [id number]   cc:   Tesfaye D. Legrand Rams, MD  Fax: [phone]

## 2011-04-26 NOTE — Assessment & Plan Note (Signed)
NAME:  [name], [name]                 CHART#:  [id number]   DATE:  10/11/2007                       DOB:  [birth year]   CHIEF COMPLAINT:  Followup of LFTs.   SUBJECTIVE:  Patient is here for a follow-up visit.  We last saw him in  June, 2008.  He was last seen at that time for mildly elevated  transaminases.  His workup thus far has included negative hepatitis B  and C serologies as well as normal ferritin.  He is on multiple  medications which could induce transaminitis.  He underwent an EGD and  colonoscopy by Dr. Gala Romney for further evaluation of a low MCV and upper  abdominal pain.  This was back in May, 2008.  He had a 3 cm tongue of  Barrett's esophagus confirmed by biopsy.  He also had a moderate-sized  hiatal hernia and some diffuse, subtle mottling of the gastric mucosa of  unclear significance.  On colonoscopy, a pan colonic diverticula with a  marginal prep.  He also had a CT of the abdomen and pelvis to further  evaluate his abdominal pain and elevated LFTs.  He had a stable 1.1 cm  hypervascular right lobe and liver lesion, consistent with a small  vascular malformation and hemangioma.  Otherwise, the liver appeared  normal.  Nothing else to explain pain or the laboratory abnormalities.  He also had an abdominal ultrasound which revealed probable fatty liver  and minimal cystic disease of the left kidney and the liver.   He had repeat LFTs recently, which demonstrated worsening transaminitis.  His alkaline phosphatase is up as well.  His alkaline phosphatase is  146, up from 138 back in September.  It was normal in June, 2008 at a  level of 108.  His AST is up to 124 from 90 and ALT is up to 183 from  142.  His albumin is normal at 4.1.  Interestingly, he also has a  peripheral eosinophilia with an absolute eosinophil count of 1.3.  This  was elevated as well on September 25th and September 2nd.  Back in June,  he had normal eosinophil count with a percentage of 3%.  The  patient was  on prednisone back in June; however, came off at some point since that  time.   He tells me he continues to have chronic right upper quadrant abdominal  pain.  He denies any nausea, vomiting, or heartburn.  He states that he  has had this pain for years.  He states that he has had it since his  stroke, but he cannot really remember how long he has had the right  upper quadrant abdominal pain.  He states that his bowel movements are  regular.  No melena or rectal bleeding.   CURRENT MEDICATIONS:  See updated list.   ALLERGIES:  No known drug allergies.   PHYSICAL EXAMINATION:  Temp 98.7, blood pressure 140/98, pulse 74.  General:  A pleasant elderly black male in no acute distress.  He is  accompanied by his wife.  Skin:  Warm and dry with no jaundice.  HEENT:  Sclerae are anicteric.  Oropharyngeal mucosa is moist and pink.  Abdomen:  Positive bowel sounds.  Soft, nondistended.  He has mild  tenderness along the right upper quadrant region.  No rebound tenderness  or guarding.  No hepatosplenomegaly or masses.  Lower extremities:  No  edema.   IMPRESSION:  Patient is a pleasant 65 year old gentleman with multiple  medical problems who now has worsening transaminitis as well as elevated  alkaline phosphatase.  He has evidence of peripheral eosinophilia, which  seems to have occurred after he has come off of his prednisone.  On EGD,  he had a mottled-appearing stomach; however, we did not have an  eosinophilic count at that time, and this was not biopsied.  It is very  possible that he has an eosinophilic gastritis/hepatitis. I have  discussed the case with Dr. Gala Romney, and we will proceed with a trial of  prednisone therapy and see if he has any response which would be  indicated by a drop in his eosinophil count and LFTs.  Given his history  of chronic intermittent prednisone therapy, we will go ahead and  schedule baseline DEXA bone density study.   PLAN:  1. CBC and  CMET in two weeks.  2. Prednisone 40 mg daily, #100, 0 refills given.  3. Baseline DEXA bone density study.  4. He will need EGD in three years with biopsies, given history of      Barrett's esophagus.  5. Further recommendations follow.       Neil Crouch, P.A.  Electronically Signed     R. Garfield Cornea, M.D.  Electronically Signed    LL/MEDQ  D:  10/11/2007  T:  10/12/2007  Job:  [id number]   cc:   Tesfaye D. Legrand Rams, MD

## 2011-04-26 NOTE — Assessment & Plan Note (Signed)
NAME:  [name], [name]                 CHART#:  [id number]   DATE:  05/21/2007                       DOB:  Sep 13, 1946   DATE OF BIRTH:  12/21/1945.   FOLLOWUP:  Mild elevation of liver function tests, low MCV, normal  hemoglobin.  Also chronic right upper quadrant abdominal pain.  Last  seen on Apr 30, 2007, at which time he underwent an EGD with biopsy,  followed by a colonoscopy.  He was found to have a 3 cm __________ in  the epithelial distal esophagus, suspicious for Barrett's.  There was a  moderate hiatal hernia.  Some mottling of the gastric mucosa, of  doubtful clinical significance.  A patent pylorus, normal D-I and D-II.  The colonoscopy findings revealed a marginal prep, normal rectum, pan-  colonic diverticula.  No other obvious abnormalities were found.  The  biopsies did indeed reveal intestinal metaplasia, consistent with  Barrett's esophagus which is somewhat unusual in African/Americans.  A  CT scan was done on May 02, 2007, which demonstrated stable  hypervascular 1.1 cm right lobe of the liver lesion, consistent with  hemangioma, small hiatal hernia, diffuse colonic diverticula.  No other  abnormalities aside from a tiny non-obstructing renal calculus in the  right renal pelvis.  I spoke to Dr. __________ and re-evaluated the CT  scan when [name] was here, and again there were no additional  findings and nothing to explain his right upper quadrant abdominal pain  which is pretty much with him all the time, not worsened by eating or  having a bowel movement.  He has not had any melena or rectal bleeding.   LABORATORY DATA:  From May 21, 2007:  Demonstrated a slightly depressed  iron of 29, a ferritin of 253, transferring of 260.   [name] is being followed by Dr. Viona Gilmore. [name] currently because of gout  in his right wrist area.  He is on prednisone and diclofenac.  He also  has a bit of a left hemiplegia from a prior CVA.   An ultrasound from March 30, 2007, of the right upper quadrant  demonstrated the gallbladder to be absent.  The common bile duct was 5  mm.  Coarse-appearing liver with smooth  margins, likely representing  fatty infiltration.  (The liver appeared otherwise normal, aside from  hemangioma on the recent CT.)   It is notable that labs from March 29, 2007, demonstrated a GOT and GPT  of 69 and 161 respectively.  Back in March of this year, his GOT and GPT  were 90 and 178.  Hemoglobin A1c at that time was 6.9.  It is also  notable that his hepatitis-B surface antibody and HCV antibody came back  negative, as did his hepatitis-B surface antigen.   His right upper quadrant abdominal pain goes back to at least 2001, and  was not helped in any way by a cholecystectomy.   PAST MEDICAL/SURGICAL HISTORY:  1. Diabetes.  2. Gout.  3. CVA.  4. Hypertension.  5. Peripheral neuropathy.  6. Status post cholecystectomy.  7. History of a low-grade lymphoma.  8. History of radiation therapy at [facility], felt to be cured.  9. History of right-sided deficit from a CVA in 1998.   CURRENT MEDICATIONS:  1. Lipitor 20 mg daily.  2. Colchicine 0.6 mg b.i.d.  3. Glyburide/Metformin 2.5/500 mg b.i.d.  4. Captopril 50 mg t.i.d.  5. ASA 325 mg daily.  6. Actos 15 mg daily.  7. Metoprolol 50 mg daily.  8. Prednisone 20 mg daily.  9. Allopurinol 300 mg daily.  10.Catapres 0.2 mg patch weekly.  11.Flomax 0.4 mg daily.  12.Amlodipine 10 mg daily.  13.Neomycin eye drops.  14.Diclofenac 100 mg daily.  15.Omeprazole 20 mg daily.  16.Aciphex 20 mg daily.  17.Oxycodone/APAP 8/325 mg, one to two tab q.6h.   ALLERGIES:  No known drug allergies.   PHYSICAL EXAMINATION:  GENERAL:  He is accompanied by his wife.  He  appears at his baseline.  VITAL SIGNS:  Height 6 feet 2 inches, temperature 99.5 degrees, blood  pressure 140/100, pulse 84.  SKIN:  Warm and dry.  CHEST:  Lungs clear to auscultation.  HEART:  A regular rate and rhythm without  murmur, gallop or rub.  ABDOMEN:  Non-distended. Positive bowel sounds, soft.  He is a little  tender along his right costal margin.  I do not appreciate any mass or  hepatosplenomegaly.  There is certainly no rash.  There is no CVA  tenderness.   ASSESSMENT/PLAN:  1. [name] is a pleasant 65 year old gentleman with multiple medical      problems, originally referred earlier this year with elevated      transaminases.  The transaminases are coming down.  There is no      evidence of chronic hepatitis-B, hepatitis-C or iron overload.  The      findings on recent esophagogastroduodenoscopy and colonoscopy are      reassuring.  I suspect we are dealing with fatty liver disease,      i.e. steatohepatitis, in part related to diabetes.  Medication      effect is not excluded at this time; however, the trend is      downward.  I do not feel that his elevated transaminases have      anything to do with his right upper quadrant abdominal pain, which      I suspect is more musculoskeletal or neuropathic.  He does have      short-segment Barrett's esophagus.  He is to be on acid suppression      indefinitely.  He will need a repeat EGD in three years with      biopsy.  He has been taking Omeprazole and Aciphex.  Will decide on      either Omeprazole or Aciphex, one or the other, but not both, once      daily indefinitely.  Will repeat a CBC and liver function tests in      three months.  2. As far as right upper quadrant pain is concerned, again I do not      feel that he has any visceral GI pathology ongoing, to explain his      symptoms and I feel at this point he will be well-served by at      least a one-time consultation with a pain management physician,      such as Dr. Allena Napoleon  of [facility].  Will speak with Dr.      Brandon Melnick D. F[place] in the near future to try to get that set up.  FOLLOWUP:  Will plan to follow up on labs in three months, and go from  there.       Bridgette Habermann, M.D.  Electronically Signed  RMR/MEDQ  D:  05/22/2007  T:  05/22/2007  Job:  [id number]   cc:   Tesfaye D. Legrand Rams, MD

## 2011-04-26 NOTE — Assessment & Plan Note (Signed)
OFFICE VISIT   Jason, [name] A  DOB:  Sep 25, 1946                                        March 05, 2010  CHART #:  [id number]   The patient came today for followup.  His chest x-ray showed no evidence  of recurrence of his pneumothorax.  His incisions are well healed after  his pleurectomy and resection of apical blebs.  His sats were 97%.  His  blood pressure is 118/80, pulse 80, and respirations 18.  He is doing  well overall.  He is back to preoperative state.  I will plan to see him  back again in 2 months with a chest x-ray for final check.   Nicanor Alcon, M.D.  Electronically Signed   DPB/MEDQ  D:  03/05/2010  T:  03/06/2010  Job:  [id number]

## 2011-04-26 NOTE — Op Note (Signed)
NAME:  [name], [name] NO.:  [id number]   MEDICAL RECORD NO.:  [id number]          PATIENT TYPE:  AMB   LOCATION:  DAY                           FACILITY:  [facility]   PHYSICIAN:  R. Garfield Cornea, M.D. DATE OF BIRTH:  1946/07/08   DATE OF PROCEDURE:  04/30/2007  DATE OF DISCHARGE:                               OPERATIVE REPORT   PROCEDURES:  EGD (esophagogastroduodenoscopy) with biopsy followed by  diagnostic colonoscopy.   INDICATIONS FOR PROCEDURE:  This patient is a 65 year old gentleman with  intermittent right upper quadrant abdominal pain and epigastric pain.  Gallbladder is out.  Ultrasound demonstrated a fatty-appearing liver.  LFTs mildly elevated.  He has a low MCV with a normal hemoglobin.  EGD  and colonoscopy are now being  done to further evaluate his GI symptoms  and his abnormal labs.  This approach has been discussed with the  patient at length.  The potential risks, benefits and alternatives have  been reviewed and questions answered and he is agreeable.  Please see  documentation in the medical record.   PROCEDURE NOTE:  O2 saturation, blood pressure, pulse and respirations  were monitored throughout the entire procedure.   CONSCIOUS SEDATION:  __________  4 mg IV and Demerol 75 mg IV in divided  doses.   INSTRUMENT:  Pentax video chip system.   ENDOSCOPIC FINDINGS:  EGD examination of the tubular esophagus revealed  a 3-cm segment of salmon-colored epithelium coming up above the EG  junction.  Otherwise, the esophageal mucosa appeared normal.  There were  no varices.  EG junction was easily traversed.  The EG junction was  patulous.  Stomach:  Gastric cavity was emptied and insufflated well  with air.  Thorough examination of the gastric mucosa including  retroflexion of the proximal stomach revealed a small hiatal hernia and  some diffuse mottling of the gastric mucosa.  There were no erosions,  ulcers or infiltrating process seen.  There  were no varices.  This did  not appear to be consistent with portal gastropathy.  Pylorus was patent  and easily traversed.  Examination of the bulb and second portion  revealed no abnormalities.  Therapeutic/diagnostic maneuvers performed:  The salmon-colored epithelium of the distal esophagus was biopsied.  The  patient tolerated the procedure well and was prepared for colonoscopy.   Digital rectal exam revealed no abnormalities.   ENDOSCOPIC FINDINGS:  The prep was marginal.  Colonic mucosa was  surveyed from the rectosigmoid junction through the left transverse,  right colon, appendiceal orifice, ileocecal valve and cecum.  These  structures were well seen and photographed for the record.  From this  level, the scope was slowly withdrawn.  Previous mucosal surfaces were  again seen.  The patient had  pancolonic diverticula and no other  abnormalities were seen.  However, poor prep made it more difficult to  see and a small lesion may not have been visualized.  The scope was  pulled down the rectum.  A thorough examination of the rectal mucosa  including retroflexed view of the anal verge demonstrated no  abnormalities.  The patient tolerated both procedures well and was  reactive after endoscopy.   IMPRESSION:  1. On esophagogastroduodenoscopy, a 3-cm tongue of salmon-colored      epithelium in the distal esophagus, patulous EG (esophagogastric)      junction, status was biopsied, otherwise normal esophagus.      Moderate size hiatal hernia.  Some diffuse subtle mottling of the      gastric mucosa of uncertain significance.  Patent pylorus with      normal D1 and D2.  2. Colonoscopy findings:  Marginal prep.  Normal rectum.  Pancolonic      diverticula.  No other obvious abnormality seen.   It would be unusual for an African American to have Barrett's esophagus  but this is just what it looked like today.  We will see what the  biopsies show.  No explanation for his upper  abdominal pain on today's  examination.  Ultrasound was also unrevealing.   RECOMMENDATIONS:  We will proceed with CT of the abdomen and pelvis.  We  will check H. pylori serology and followup on path.  We plan to see this  nice gentleman back with repeat LFTs in the office in 3 weeks.      Bridgette Habermann, M.D.  Electronically Signed     RMR/MEDQ  D:  04/30/2007  T:  04/30/2007  Job:  [id number]   cc:   Barrie Folk. Berdine Addison, MD  Fax: [phone]

## 2011-04-26 NOTE — Assessment & Plan Note (Signed)
OFFICE VISIT   Jason, [name]  DOB:  1946/06/12                                        May 05, 2010  CHART #:  [id number]   The patient came for [name] final followup today.  Chest x-ray is stable.  No  evidence of recurrence of his pneumothorax.  His lungs well expanded.  His blood pressure is 140/80, pulse 80, respirations 18, sats were 94%.  I will see him back again if he has any further problems.   Nicanor Alcon, M.D.  Electronically Signed   DPB/MEDQ  D:  05/05/2010  T:  05/06/2010  Job:  [id number]   cc:   Estill Bamberg. Karie Kirks, M.D.

## 2011-04-26 NOTE — Op Note (Signed)
NAME:  [name], [name]                ACCOUNT NO.:  [id number]   MEDICAL RECORD NO.:  [id number]          PATIENT TYPE:  AMB   LOCATION:  DAY                           FACILITY:  [facility]   PHYSICIAN:  R. Garfield Cornea, M.D. DATE OF BIRTH:  27-Nov-1946   DATE OF PROCEDURE:  02/05/2008  DATE OF DISCHARGE:                               OPERATIVE REPORT   PROCEDURE PERFORMED:  Esophagogastroduodenoscopy with small bowel,  gastric, and esophageal biopsies.   INDICATIONS FOR PROCEDURE:  65 year old gentleman with intermittent  right upper quadrant abdominal pain, elevated liver enzymes, and a  persistently elevated peripheral eosinophil count.  All the above  symptoms have improved previously with a course of corticosteroids.  He  has a history of short segment Barrett's esophagus on EGD one year ago.  EGD is now being done to further evaluate his symptoms.  He has a  history of peptic ulcer disease and has been on NSAIDs, as well.  This  approach has been discussed with the patient at length.  The potential  risks, benefits and alternatives have been reviewed, questions answered,  he is agreeable.  Please see documentation in the medical record.   PROCEDURE NOTE:  O2 saturation, blood pressure, pulse rate, and  respirations were monitored throughout the entire procedure.  Conscious  sedation with Versed 2 mg IV and Demerol 50 mg IV in divided doses.  Cetacaine spray for topical pharyngeal anesthesia.  Instrument Pentax  video chip system.   FINDINGS:  The tubular esophagus showed a couple short tongues of salmon  colored epithelium coming up the distal esophagus, there was a 1 cm  islands of salmon colored epithelium in the distal esophagus separated  from the squamocolumnar junction with pearly gray normal appearing  esophageal mucosa.  There was no evidence of tumor or obstruction.  The  EG junction was easily traversed.  The gastric cavity was empty and  insufflated well with air.  A  thorough examination of the gastric mucosa  including retroflex view of the proximal stomach and esophagogastric  junction demonstrated a small hiatal hernia and diffuse mottling of the  gastric mucosa.  There was a small anterior wall gastric diverticulum  noted, as well.  There was no infiltrating process. No ulcer was  observed.  The pylorus was patent and easily traversed.  Examination of  the bulb and second portion revealed areas of circular erosions in the  bulb of uncertain significance.  They were multiple extending down to  the second portion of the duodenum, please see photos.   THERAPEUTIC/DIAGNOSTIC MANEUVERS PERFORMED:  1. Biopsies of the abnormal areas of duodenal mucosa were taken for      histologic study.  2. Antral and gastric body mucosal biopsies were taken and finally      biopsies of the salmon colored epithelium of the distal esophagus      were taken.   The patient tolerated the procedure well and was reacted in endoscopy.   IMPRESSION:  1. Salmon colored epithelium in the distal esophagus consistent with      prior diagnosis of Barrett's esophagus  status post rebiopsy.  2. Hiatal hernia.  3. Small anterior wall gastric diverticulum status post gastric mucosa      biopsy.  4. Patent pylorus, abnormal junction between D1 and D2, with numerous      erosions as described above status post biopsy.   RECOMMENDATIONS:  1. Will follow up on path.  2. Further recommendations to follow.      Bridgette Habermann, M.D.  Electronically Signed     RMR/MEDQ  D:  02/05/2008  T:  02/05/2008  Job:  [id number]

## 2011-04-26 NOTE — Assessment & Plan Note (Signed)
NAME:  [name], [name]                 CHART#:  [id number]   DATE:  12/20/2007                       DOB:  Feb 22, 1946   History of elevated peripheral eosinophil count, right upper quadrant  abdominal pain, status post cholecystectomy, chronic, elevated  transaminases, history of Barrett's esophagus.  The patient's right  upper quadrant pain has worsened.  It has been off and on since 2004.  Diagnosis of a sphincter of Oddi dysfunction has been entertained along  the way.  There has been no biliary dilation on numerous imaging  studies, including most recently a CT by Dr. Nira Retort on December 31st.  There was a 2-cm right kidney stone, nonobstructing, fullness of the  adrenal gland, otherwise no explanation for symptoms radiographically.   From December 3rd his white count was normal at 10,000, H&H 14.0, 43.6,  MCV 75.  LFTs again, AST 99, ALT 148, albumin 3.1.  Additional labs done  through this office:  Ferritin 589, Alpha-1 Antitrypsin 161,  ceruloplasmin 33, ANA pending, AMA pending, smooth muscle antibody less  than 20, iron saturation 39%.  He was treated empirically with  prednisone.  His LFTs did improve somewhat.  Has not effected his  abdominal pain in any way.  His eosinophil count has improved from 16 to  1% recently with prednisone therapy.  He does not have any diarrhea,  melena, or constipation.  Of note, back in June of this year, his LFTs  looked entirely normal except for a slightly elevated SGPT in the 80  range.  He is followed by Dr. Justine Null for gout in his right wrist.   Hepatitis B and C serologies came back negative.   PAST MEDICAL HISTORY:  1. Diabetes.  2. Gout.  3. CVA.  4. Hypertension.  5. Barrett's esophagus.  EGD, 04/30/2007, demonstrated Barrett's      esophagus, biopsy without dysplasia, some diffuse subtle mottling      of the gastric mucosa.  No infiltrating process or ulcer was seen.      Colonoscopy demonstrated pancolonic diverticula.   Prior  ultrasound of the liver demonstrated fatty infiltration, minimal  cystic disease, left kidney and liver.  On a prior contrast CT, there  was a 1.1-cm hypervascular lesion right lobe of the liver, most  consistent with a hemangioma.   CURRENT MEDICATIONS:  See updated list.   ALLERGIES:  No known drug allergies.   PHYSICAL EXAMINATION:  Reveals a somewhat feeble 65 year old gentleman  ambulating with a cane, accompanied by his wife.  Weight estimated to be 232 to 240 pounds.  It was difficult for him to  get on the scale.  His height 6 feet 1 inch, temp 99, blood pressure  110/80, pulse 56.  SKIN:  Warm and dry.  HEENT EXAM:  No scleral icterus, conjunctivae are pink.  He has a  disconjugate gaze in the right eye.  CHEST/LUNGS:  Clear to auscultation.  CARDIAC EXAM:  Regular rate and rhythm without murmurs, gallops, or  rubs.  ABDOMEN:  Nondistended, positive bowel sounds, soft.  He is tender in  the right upper quadrant.  I do not appreciate any mass or  hepatosplenomegaly.   ASSESSMENT:  A 65 year old gentleman with numerous medical problems,  chronic right upper quadrant abdominal pain, seems to be worsening  recently.  Now with more elevations in  his liver enzymes and elevation  in his peripheral eosinophil count, the latter of which responded to a  course of prednisone therapy.   No significant findings on upper gastrointestinal tract other than  Barrett's esophagus (which is unusual in an African-American, but  nonetheless present).   No infiltrating process seen in the patient's stomach.  It is difficult  to put his right upper quadrant abdominal pain and elevated  transaminases together unless he were to have a common duct stone, but  he has a paucity of biliary dilations over time on multiple studies.  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   RECOMMENDATIONS:  I feel we ought to go ahead and look at his bile duct  further before proceeding with a liver biopsy.  To  this end, we will  obtain an MRI as soon as can be arranged.  If that study is negative I  told the patient and his wife we would go ahead and proceed on with a  liver biopsy.  I told the patient and his wife even after going through  these next procedures I could not guarantee them that we would get to  the bottom of this situation, nor could I guarantee relief from his  right upper quadrant abdominal pain.  I also told them they may  ultimately be best served by a tertiary referral for a second opinion if  we do not get anywhere with MRI and subsequent liver biopsy.  Further  recommendations to follow in the very near future.       Bridgette Habermann, M.D.  Electronically Signed     RMR/MEDQ  D:  12/20/2007  T:  12/20/2007  Job:  [id number]   cc:   Tesfaye D. Legrand Rams, MD

## 2011-04-26 NOTE — Letter (Signed)
February 05, 2010   Tesfaye D. Legrand Rams, [address]  [place], [place] [address]   Re:  JAXEN, SAMPLES                DOB:  1946/09/09   Dear Dr. Legrand Rams:   This is a followup to the patient who was referred to [place] by Dr. 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  We ended up having to do a resection of the lung disease with a  pleurectomy.  He comes back today and removed his chest tube sutures.  His chest x-ray shows his lungs expanded and he is doing well overall.  His blood pressure was 100/60, pulse 80, respirations 18, and sats were  95%.  I will see him back again in 3 weeks with another chest x-ray.   Nicanor Alcon, M.D.  Electronically Signed   DPB/MEDQ  D:  02/05/2010  T:  02/06/2010  Job:  [id number]   cc:   Felicie Morn, M.D.

## 2011-04-29 NOTE — Op Note (Signed)
   NAME:  [name], [name]                          ACCOUNT NO.:  [id number]   MEDICAL RECORD NO.:  [id number]                   PATIENT TYPE:  AMB   LOCATION:  DAY                                  FACILITY:  [facility]   PHYSICIAN:  R. Garfield Cornea, M.D.              DATE OF BIRTH:  1946/06/10   DATE OF PROCEDURE:  07/29/2003  DATE OF DISCHARGE:                                 OPERATIVE REPORT   PROCEDURE:  Diagnostic esophagogastroduodenoscopy.   INDICATION FOR PROCEDURE:  The patient is a 65 year old gentleman with right  upper quadrant abdominal pain.  He is status post cholecystectomy,  reportedly had a CT and ultrasound of the abdomen that demonstrated no  significant findings.  EGD is now being done to further evaluate his  symptoms.  This approach has been discussed with the patient at length.  Potential risks, benefits, and alternatives have been reviewed, questions  answered.   PROCEDURE NOTE:  O2 saturation, blood pressure, pulse, and respirations were  monitored throughout the entirety of the procedure.   CONSCIOUS SEDATION:  Versed 2 mg IV, Demerol 50 mg IV in divided doses.   INSTRUMENT USED:  Olympus video chip adult gastroscope.   FINDINGS:  Examination of the tubular esophagus revealed no mucosal  abnormalities.  EG junction easily traversed.   Stomach:  Gastric cavity was empty and insufflated well with air.  A  thorough examination of the gastric mucosa, including a retroflexed view of  the proximal stomach and esophagogastric junction, demonstrated a single  diverticulum in the body and multiple tiny antral erosions.  There was no  ulcer crater or infiltrating process seen.  Pylorus patent and easily  traversed.  Examination of the bulb and second portion revealed no  abnormalities.   Therapeutic/diagnostic maneuvers performed:  None.   The patient tolerated the procedure well and was reacted in endoscopy.   IMPRESSION:  1. Normal esophagus.  2. Tiny antral  erosions, single diverticulum in the body.  3. Remainder of gastric mucosa appeared normal.  4. Normal D1, D2.   No endoscopic explanation for patient's symptoms.  The tiny erosions may be  secondary to Indocin or Helicobacter pylori.    RECOMMENDATIONS:  1. Will check amylase, lipase, LFTs, HP serologies today.  2. Further recommendations to follow.                                               Bridgette Habermann, M.D.    RMR/MEDQ  D:  07/29/2003  T:  07/29/2003  Job:  [id number]   cc:   Barrie Folk. Berdine Addison, M.D.  [address]  [place]  [place] [address]  Fax: [phone]

## 2011-04-29 NOTE — Consult Note (Signed)
NAME:  [name], [name] NO.:  [id number]   MEDICAL RECORD NO.:  [id number]                  PATIENT TYPE:   LOCATION:                                       FACILITY:   PHYSICIAN:  R. Garfield Cornea, M.D.              DATE OF BIRTH:  1946-04-30   DATE OF CONSULTATION:  07/17/2003  DATE OF DISCHARGE:                                   CONSULTATION   REFERRING PHYSICIAN:  Barrie Folk. Berdine Addison, M.D.   REASON FOR CONSULTATION:  Chronic right upper quadrant abdominal pain.   HISTORY OF PRESENT ILLNESS:  [name] is a 65 year old black gentleman who  has a 2-3-year history of right upper quadrant abdominal pain.  He had a  stroke in 1998 and has right-sided residual weakness.  He, a couple of years  later, developed right upper abdominal pain.  He underwent a cholecystectomy  but has had no improvement of his abdominal pain.  Last year, he had an EGD  and colonoscopy.  He reports these studies were normal.  Records are not  available.  He complains of postprandial right upper quadrant abdominal  pain.  Denies any nausea or vomiting.  He tends to have pain almost  constantly at this point.  He has been tried on Prilosec in the past with no  help.  He also takes Vicodin for pain.  Denies any heartburn, dysphagia,  odynophagia.  He states his appetite is good.  Denies any weight loss.  His  bowels move regularly.  He denies any melena or rectal bleeding.   CURRENT MEDICATIONS:  1. Vicodin 5/500 1-2 daily.  2. Glucovance 1.25/250 mg twice a day.  3. Hydrochlorothiazide 25 mg daily.  4. Captopril 25 mg three times a day.  5. Indomethacin 25 mg three times a day.  6. Norvasc 5 mg daily.  7. Lipitor 20 mg daily.  8. Ditropan 10 mg daily.  9. Tums p.r.n.   ALLERGIES:  No known drug allergies.   PAST MEDICAL HISTORY:  1. Noninsulin-dependent diabetes mellitus.  2. Hypertension.  3. Hypercholesterolemia.  4. Gout.  5. Weak bladder.  6. CVA in 1998 with  right-sided residual weakness.   PAST SURGICAL HISTORY:  1. Cholecystectomy.  2. Thirty years ago, he had, it sounds like, lymphoma with removal of lymph     node from the right side of his neck.  Status post radiation therapy at     [facility].   FAMILY HISTORY:  Mother is 65 years old and healthy.  Father died of MI.  No  family history of colorectal cancer or chronic GI illnesses.   SOCIAL HISTORY:  He is married for 21 years, has a son.  He is disabled.  He  quit smoking in 1998.  He occasionally consumes alcohol.   REVIEW OF SYSTEMS:  GI:  Please see HPI.  GENERAL:  Please see HPI.  CARDIOPULMONARY:  Denies any chest pain or shortness of breath.   PHYSICAL EXAMINATION:  VITAL SIGNS:  Height 6 feet 1 inch, temperature 98.6,  blood pressure 142/100, pulse 98.  GENERAL:  A pleasant, well-nourished, well-developed black gentleman in no  acute distress.  He has obvious weakness to the right upper extremity.  SKIN:  Warm and dry.  No jaundice.  HEENT:  Conjunctivae are pink.  Sclerae are nonicteric.  Oropharyngeal  mucosa:  Moist and pink.  No lesions, erythema or exudate.  No  lymphadenopathy, thyromegaly.  CHEST/LUNGS:  Clear to auscultation.  CARDIAC:  Exam reveals regular rate and rhythm, normal S1 and S2.  No  murmurs, rubs or gallops.  ABDOMEN:  Positive bowel sounds, soft, nondistended.  In the epigastric  region, he has tenderness to deep palpation.  He also has tenderness with  palpation of the right upper quadrant and right mid abdomen.  No  organomegaly or masses.  No rebound tenderness or guarding.  EXTREMITIES:  No edema.   LABORATORY DATA:  X-rays September 11, 2002:  CT of the abdomen and pelvis  revealed a 15-mm, ill-defined parenchymal density most likely within the  inferior portion of the ______, fatty change in the liver, small hepatic  cyst and 3-cm, simple-appearing left lower pole renal cyst.  CT October 03, 2002 revealed minimal fluid at the gallbladder  fossa, mild adrenal  hyperplasia, left renal cyst and probable tiny hepatic cyst.  Abdominal  ultrasound June 10, 2003 revealed fatty liver, left renal and small hepatic  cyst.   IMPRESSION:  The patient is a pleasant 65 year old black gentleman with  approximately a 3-year history of postprandial right upper quadrant  abdominal pain.  Cholecystectomy did not help his symptoms.  He also,  apparently, had an upper endoscopy a year ago and does not recall any  remarkable findings.  On examination, he is quite tender in the epigastric  region as well.   DIFFERENTIAL DIAGNOSES:  1. Peptic ulcer disease.  2. Sphincter of Oddi dysfunction.   I doubt that we are dealing with choledocholithiasis, given normal common  bile duct diameter.  He did have evidence of gouty liver on ultrasound and  prior CT scans.  Will obtain a set of LFTs.  Initially, would like to begin  workup with upper endoscopy.  If unremarkable findings, he will need to have  further evaluation.   PLAN:  1. EGD in the near future.  2. LFTs, amylase and lipase at time of endoscopy.  3.     Begin Aciphex 20 mg p.o. daily, #30 samples given.  4. Further recommendations to follow.   I would like to thank Dr. Berdine Addison for allowing [place] to take part in the care of  this patient.     Neil Crouch, Richelle Ito, M.D.    LL/MEDQ  D:  07/17/2003  T:  07/17/2003  Job:  [id number]

## 2011-04-29 NOTE — Op Note (Signed)
NAME:  [name], [name] NO.:  [id number]   MEDICAL RECORD NO.:  [id number]          PATIENT TYPE:  AMB   LOCATION:  DAY                           FACILITY:  [facility]   PHYSICIAN:  Felicie Morn, M.D. DATE OF BIRTH:  04-Mar-1946   DATE OF PROCEDURE:  08/16/2006  DATE OF DISCHARGE:                                 OPERATIVE REPORT   SURGEON:  Felicie Morn, M.D.   PREOPERATIVE DIAGNOSES:  1. Phimosis.  2. Status post cerebrovascular accident with significant right-sided      weakness.   NOTE:  This is a 65 year old black male who had a cerebrovascular accident  some time ago, who was referred for a circumcision, as this patient could  not clean himself for urinate without soiling himself due to his phimosis  and his wife could be of no help as well, trying to be clean and help him  with his daily necessities.  We discussed circumcision with this patient in  detail and discussed complications not limited to of bleeding and infection  and informed consent was obtained.   SPECIMEN:  That of foreskin.   TECHNIQUE:  The patient was placed in the supine position after adequate  administration of spinal anesthesia.  He was prepped with Betadine solution  and draped in the usual manner.  The foreskin was circumferentially excised  around the corona of the penis.  We checked for bleeding; this was  controlled with a needle-tip Bovie device, and then approximated the skin in  the mucosal layer of the epithelium with 4-0 Vicryl with a Xeroform and a  stockinette dressing applied afterwards.  We also used approximately 15 mL  of 0.5% Sensorcaine to help with postoperative comfort.  Prior to closure,  all sponge, need and instrument counts were found to be correct.  Estimated  blood loss was minimal.  The patient received 1 L of crystalloids  intraoperatively.  There were no complications.  . Please note that this  patient is to return to Dr. Legrand Rams for any medical problems  he may have in  the future.      Felicie Morn, M.D.  Electronically Signed     WB/MEDQ  D:  08/16/2006  T:  08/16/2006  Job:  [id number]   cc:   Tesfaye D. Legrand Rams, MD  Fax: [phone]

## 2011-04-29 NOTE — H&P (Signed)
[facility]  Patient:    [name], [name] Visit Number: [id number] MRN: [id number]          Service Type: OUT Location: RAD Attending Physician:  Maggie Font Dictated by:   Irving Shows, M.D. Admit Date:  05/02/2002 Discharge Date: 05/02/2002                           History and Physical  HISTORY OF PRESENT ILLNESS:  [age]-year-old male with a history of recurrent epigastric pain, progressive heartburn, symptomatic for greater than one month.  He is admitted for upper and lower endoscopies.  PAST HISTORY:  Hypertension, diabetes mellitus, chronic obstructive lung disease, osteoarthritis, hyperlipidemia, status post CVA with right hemiparesis, diabetic peripheral neuropathy.  PAST SURGICAL HISTORY:  Cholecystectomy.  MEDICATIONS: 1. Norvasc 5 mg q.d. 2. Captopril 25 mg t.i.d. 3. Nexium 40 mg q.d. 4. Hydrochlorothiazide 25 mg q.d. 5. Glucovance 250/1.25 mg b.i.d. 6. Ditropan XL 5 mg q.d. 7. Lipitor 20 mg q.d. 8. Indocin 25 mg t.i.d. 9. Colchicine 0.6 mg q.d.  FAMILY HISTORY:  Family history is positive for hypertension, atherosclerotic heart disease with cardiomyopathy.  ALLERGIES:  No known drug allergies.  PHYSICAL EXAMINATION:  VITAL SIGNS:  Blood pressure 140/92, pulse 80, respirations 18.  Weight 261 pounds.  HEENT:  Unremarkable.  NECK:  Neck is supple without JVD or bruit.  CHEST:  Clear to auscultation.  HEART:  Regular rate and rhythm without murmur, gallop or rub.  ABDOMEN:  Soft and nontender.  No masses.  RECTAL:  Normal.  No masses.  Negative stool guaiac.  EXTREMITIES:  No cyanosis, clubbing or edema.  NEUROLOGIC:  Severe right-sided hemiparesis.  Left third nerve palsy with lateral deviation, OS.  IMPRESSION: 1. Chronic abdominal pain of uncertain etiology. 2. Hypertension. 3. Non-insulin-dependent diabetes mellitus. 4. Chronic obstructive lung disease. 5. Cerebrovascular insufficiency, status post  cerebrovascular accident. 6. Osteoarthritis. 7. Hyperlipidemia.  PLAN:  Upper and lower endoscopies. Dictated by:   Irving Shows, M.D. Attending Physician:  Maggie Font DD:  05/29/02 TD:  05/29/02 Job: [id number]

## 2011-05-03 ENCOUNTER — Other Ambulatory Visit: Payer: Self-pay | Admitting: Gastroenterology

## 2011-05-04 LAB — CBC WITH DIFFERENTIAL/PLATELET
Eosinophils Relative: 8 % — ABNORMAL HIGH (ref 0–5)
HCT: 43.2 % (ref 39.0–52.0)
Lymphocytes Relative: 22 % (ref 12–46)
Lymphs Abs: 1.7 10*3/uL (ref 0.7–4.0)
MCV: 74.1 fL — ABNORMAL LOW (ref 78.0–100.0)
Monocytes Absolute: 0.4 10*3/uL (ref 0.1–1.0)
Neutro Abs: 4.9 10*3/uL (ref 1.7–7.7)
RBC: 5.83 MIL/uL — ABNORMAL HIGH (ref 4.22–5.81)
WBC: 7.7 10*3/uL (ref 4.0–10.5)

## 2011-05-08 ENCOUNTER — Emergency Department (HOSPITAL_COMMUNITY): Payer: Medicare Other

## 2011-05-08 ENCOUNTER — Emergency Department (HOSPITAL_COMMUNITY)
Admission: EM | Admit: 2011-05-08 | Discharge: 2011-05-09 | Disposition: A | Discharge status: Home / Self Care | Payer: Medicare Other | Attending: Emergency Medicine | Admitting: Emergency Medicine

## 2011-05-08 DIAGNOSIS — E119 Type 2 diabetes mellitus without complications: Secondary | ICD-10-CM | POA: Insufficient documentation

## 2011-05-08 DIAGNOSIS — R1013 Epigastric pain: Secondary | ICD-10-CM | POA: Insufficient documentation

## 2011-05-08 DIAGNOSIS — I10 Essential (primary) hypertension: Secondary | ICD-10-CM | POA: Insufficient documentation

## 2011-05-08 DIAGNOSIS — K5900 Constipation, unspecified: Secondary | ICD-10-CM | POA: Insufficient documentation

## 2011-05-08 DIAGNOSIS — Z8673 Personal history of transient ischemic attack (TIA), and cerebral infarction without residual deficits: Secondary | ICD-10-CM | POA: Insufficient documentation

## 2011-05-08 LAB — DIFFERENTIAL
Eosinophils Relative: 7 % — ABNORMAL HIGH (ref 0–5)
Lymphocytes Relative: 24 % (ref 12–46)
Lymphs Abs: 2 10*3/uL (ref 0.7–4.0)
Monocytes Absolute: 0.7 10*3/uL (ref 0.1–1.0)
Neutro Abs: 5.1 10*3/uL (ref 1.7–7.7)

## 2011-05-08 LAB — URINALYSIS, ROUTINE W REFLEX MICROSCOPIC
Bilirubin Urine: NEGATIVE
Glucose, UA: NEGATIVE mg/dL
Hgb urine dipstick: NEGATIVE
Ketones, ur: NEGATIVE mg/dL
Specific Gravity, Urine: >1.03 — ABNORMAL HIGH (ref 1.005–1.030)
pH: 5.5 (ref 5.0–8.0)

## 2011-05-08 LAB — HEPATIC FUNCTION PANEL
ALT: 15 U/L (ref 0–53)
AST: 16 U/L (ref 0–37)
Albumin: 3.9 g/dL (ref 3.5–5.2)
Alkaline Phosphatase: 93 U/L (ref 39–117)
Bilirubin, Direct: 0.1 mg/dL (ref 0.0–0.3)
Total Bilirubin: 0.3 mg/dL (ref 0.3–1.2)

## 2011-05-08 LAB — BASIC METABOLIC PANEL
BUN: 30 mg/dL — ABNORMAL HIGH (ref 6–23)
CO2: 29 mEq/L (ref 19–32)
Chloride: 96 mEq/L (ref 96–112)
GFR calc non Af Amer: 42 mL/min — ABNORMAL LOW (ref >60)
Glucose, Bld: 184 mg/dL — ABNORMAL HIGH (ref 70–99)
Potassium: 4.1 mEq/L (ref 3.5–5.1)
Sodium: 134 mEq/L — ABNORMAL LOW (ref 135–145)

## 2011-05-08 LAB — CBC
HCT: 41.5 % (ref 39.0–52.0)
Hemoglobin: 13.4 g/dL (ref 13.0–17.0)
MCV: 75.5 fL — ABNORMAL LOW (ref 78.0–100.0)
RDW: 16.8 % — ABNORMAL HIGH (ref 11.5–15.5)
WBC: 8.5 10*3/uL (ref 4.0–10.5)

## 2011-05-08 MED ORDER — IOHEXOL 300 MG/ML  SOLN
100.0000 mL | Freq: Once | INTRAMUSCULAR | Status: AC | PRN
  Administered 2011-05-08: 100 mL via INTRAVENOUS

## 2011-05-10 ENCOUNTER — Other Ambulatory Visit: Payer: Self-pay | Admitting: Gastroenterology

## 2011-05-10 DIAGNOSIS — R1011 Right upper quadrant pain: Secondary | ICD-10-CM

## 2011-05-10 DIAGNOSIS — R718 Other abnormality of red blood cells: Secondary | ICD-10-CM

## 2011-05-13 ENCOUNTER — Other Ambulatory Visit (HOSPITAL_COMMUNITY): Payer: Self-pay | Admitting: Internal Medicine

## 2011-05-13 DIAGNOSIS — R9089 Other abnormal findings on diagnostic imaging of central nervous system: Secondary | ICD-10-CM

## 2011-05-17 ENCOUNTER — Ambulatory Visit (HOSPITAL_COMMUNITY)
Admission: RE | Admit: 2011-05-17 | Discharge: 2011-05-17 | Disposition: A | Discharge status: Home / Self Care | Payer: Medicare Other | Source: Ambulatory Visit | Attending: Internal Medicine | Admitting: Internal Medicine

## 2011-05-17 DIAGNOSIS — M5126 Other intervertebral disc displacement, lumbar region: Secondary | ICD-10-CM | POA: Insufficient documentation

## 2011-05-17 DIAGNOSIS — M545 Low back pain, unspecified: Secondary | ICD-10-CM | POA: Insufficient documentation

## 2011-05-17 DIAGNOSIS — M5137 Other intervertebral disc degeneration, lumbosacral region: Secondary | ICD-10-CM | POA: Insufficient documentation

## 2011-05-17 DIAGNOSIS — M51379 Other intervertebral disc degeneration, lumbosacral region without mention of lumbar back pain or lower extremity pain: Secondary | ICD-10-CM | POA: Insufficient documentation

## 2011-05-17 DIAGNOSIS — R9089 Other abnormal findings on diagnostic imaging of central nervous system: Secondary | ICD-10-CM

## 2011-06-02 ENCOUNTER — Ambulatory Visit (HOSPITAL_COMMUNITY): Payer: Medicare Other

## 2011-07-01 ENCOUNTER — Encounter (HOSPITAL_COMMUNITY): Payer: Medicare Other | Attending: Oncology | Admitting: Oncology

## 2011-07-01 ENCOUNTER — Encounter (HOSPITAL_COMMUNITY): Payer: Self-pay | Admitting: Oncology

## 2011-07-01 VITALS — BP 111/73 | HR 71 | Temp 98.4°F | Ht 73.0 in | Wt 230.0 lb

## 2011-07-01 DIAGNOSIS — N400 Enlarged prostate without lower urinary tract symptoms: Secondary | ICD-10-CM | POA: Insufficient documentation

## 2011-07-01 DIAGNOSIS — Z8673 Personal history of transient ischemic attack (TIA), and cerebral infarction without residual deficits: Secondary | ICD-10-CM | POA: Insufficient documentation

## 2011-07-01 DIAGNOSIS — Z87898 Personal history of other specified conditions: Secondary | ICD-10-CM | POA: Insufficient documentation

## 2011-07-01 DIAGNOSIS — Z79899 Other long term (current) drug therapy: Secondary | ICD-10-CM | POA: Insufficient documentation

## 2011-07-01 DIAGNOSIS — E7800 Pure hypercholesterolemia, unspecified: Secondary | ICD-10-CM | POA: Insufficient documentation

## 2011-07-01 DIAGNOSIS — R937 Abnormal findings on diagnostic imaging of other parts of musculoskeletal system: Secondary | ICD-10-CM | POA: Insufficient documentation

## 2011-07-01 DIAGNOSIS — K921 Melena: Secondary | ICD-10-CM | POA: Insufficient documentation

## 2011-07-01 DIAGNOSIS — I10 Essential (primary) hypertension: Secondary | ICD-10-CM | POA: Insufficient documentation

## 2011-07-01 DIAGNOSIS — M949 Disorder of cartilage, unspecified: Secondary | ICD-10-CM

## 2011-07-01 DIAGNOSIS — M899 Disorder of bone, unspecified: Secondary | ICD-10-CM

## 2011-07-01 DIAGNOSIS — E119 Type 2 diabetes mellitus without complications: Secondary | ICD-10-CM | POA: Insufficient documentation

## 2011-07-01 LAB — COMPREHENSIVE METABOLIC PANEL
ALT: 17 U/L (ref 0–53)
AST: 15 U/L (ref 0–37)
CO2: 29 mEq/L (ref 19–32)
Calcium: 11.2 mg/dL — ABNORMAL HIGH (ref 8.4–10.5)
Creatinine, Ser: 1.46 mg/dL — ABNORMAL HIGH (ref 0.50–1.35)
GFR calc Af Amer: 59 mL/min — ABNORMAL LOW (ref >60)
GFR calc non Af Amer: 49 mL/min — ABNORMAL LOW (ref >60)
Sodium: 137 mEq/L (ref 135–145)
Total Protein: 8.5 g/dL — ABNORMAL HIGH (ref 6.0–8.3)

## 2011-07-01 LAB — PROTIME-INR
INR: 0.97 (ref 0.00–1.49)
Prothrombin Time: 13.1 seconds (ref 11.6–15.2)

## 2011-07-01 LAB — CBC
MCH: 24.5 pg — ABNORMAL LOW (ref 26.0–34.0)
MCHC: 32.3 g/dL (ref 30.0–36.0)
MCV: 75.7 fL — ABNORMAL LOW (ref 78.0–100.0)
Platelets: 181 10*3/uL (ref 150–400)
RBC: 5.76 MIL/uL (ref 4.22–5.81)

## 2011-07-01 LAB — APTT: aPTT: 30 seconds (ref 24–37)

## 2011-07-01 NOTE — Patient Instructions (Signed)
[facility]  Discharge Instructions  RECOMMENDATIONS MADE BY THE CONSULTANT AND ANY TEST RESULTS WILL BE SENT TO YOUR REFERRING DOCTOR.   EXAM FINDINGS BY MD TODAY AND SIGNS AND SYMPTOMS TO REPORT TO CLINIC OR PRIMARY HT:[id number] per MD       SPECIAL INSTRUCTIONS/FOLLOW-UP: Xray Studies Needed MRI lumbar spine and bone scan and Return to Clinic in 2 weeks.   I acknowledge that I have been informed and understand all the instructions given to me and received a copy. I do not have any more questions at this time, but understand that I may call the Specialty Clinic at [facility] at [phone] during business hours should I have any further questions or need assistance in obtaining follow-up care.    __________________________________________  _____________  __________ Signature of Patient or Authorized Representative            Date                   Time    __________________________________________ Nurse's Signature

## 2011-07-01 NOTE — Progress Notes (Signed)
CC:   Tesfaye D. Legrand Rams, MD R. Garfield Cornea, MD FACP [facility]  DIAGNOSES: 1. Abnormal L2 possibly consistent with metastatic disease, though     biopsy has not taken place. 2. History of lymphoma in 1974 treated with radiation therapy at [facility]. 3. Stroke, left-sided with right-sided residual weakness and     essentially hemiparesis.  He cannot use his right hand or right     leg.  He cannot walk.  He is in a wheelchair today accompanied by     his wife. 4. Hypertension on multiple medications. 5. Diabetes mellitus on Lantus insulin as well as Glyburide and Actos. 6. History of gout on allopurinol 300 mg a day along with Uloric 40 mg     once a day. 7. Gastroesophageal reflux disease on omeprazole 40 mg a day. 8. Hypercholesterolemia on simvastatin 20 mg once a day. 9. Benign prostatic hypertrophy on tamsulosin 0.4 mg once a day     according to his records. 10.Poor dental hygiene. 11.History of cholecystectomy years ago.  HISTORY:  This is a 65 year old African American gentleman who is from [place], still living here in [place], married to his second wife who is with him today who was found to have on a CT scan done for abdominal pain and constipation, loss of appetite, an abnormal area at L2 that was 2.8 cm "mixed sclerotic and lytic process" very suggestive of cancer, so he is referred here today for that diagnosis to be further evaluated.  He has not lost weight.  He has not lost his appetite.  He has some abdominal pain on the right side which he has had for a couple of years that is not new or different.  He has been evaluated by Dr. Gala Romney in the past.  He is not having night sweats, fevers or chills.  He does have some sweating but it is not a true night sweat.  He is not aware of any lumps anywhere nor is his wife.  He has occasional incontinence of urine.  He does not seem to have incontinence of stool.  He had 2 children by his first marriage.  One son died at age  35 from heart disease.  His living son is 54 who has blood pressure issues and COPD.  FAMILY HISTORY:  Father was 55 when he died of a heart attack and his mother was 46 which she died from heart disease.  He smoked for 38 years and worked his way up to 3-4 packs a day just before quitting.  He quit the day he had his stroke.  He does not use alcohol.  REVIEW OF SYSTEMS:  Unremarkable.  PHYSICAL EXAM:  Vital signs:  He is 6 feet 1 inch tall, 230 pounds, blood pressure 111/73 left arm in sitting position, pulse right around 72 and regular, respirations 16 and unlabored.  General:  He is in no acute distress.  He is afebrile.  Skin:  Warm and dry the touch.  He has swelling of both ankles, 2+ on the right, 1+ on the left.  Pulses in his left foot are 2+ both posterior tibialis and dorsalis pedis pulses.  He has no obvious inguinal nodes.  No obvious hepatosplenomegaly.  Bowel sounds are clearly diminished.  Heart:  His heart shows a regular rhythm and rate without murmur, rub or gallop.  Lungs:  Clear anteriorly.  He has no adenopathy in the axillary, supraclavicular, infraclavicular or cervical areas.  He has multiple missing teeth.  What  teeth he has are in poor shape.  Tongue is unremarkable.  Throat is clear.  His right pupil has had surgery on it for cataract.  The left has a small cataract in it without surgical changes.  He has esotropia but he sees out of both eyes.  He does wear glasses.  Facial symmetry:  He has a little right lower facial weakness.  He has paralysis of the right arm and hand and of the right leg.  He has good strength his left hand and left foot.  This gentleman needs further workup including some lab work, bone scan, MRI and then a biopsy if needed.  We will proceed with that workup.  We will see him back in 2 weeks.  Hopefully we will get the information we need next week and be able to proceed with setting up a biopsy if it  is indicated.    ______________________________ Gaston Islam. Tressie Stalker, MD ESN/MEDQ  D:  07/01/2011  T:  07/01/2011  Job:  [id number]

## 2011-07-01 NOTE — Progress Notes (Signed)
This office note has been dictated.

## 2011-07-05 ENCOUNTER — Ambulatory Visit (HOSPITAL_COMMUNITY)
Admission: RE | Admit: 2011-07-05 | Discharge: 2011-07-05 | Disposition: A | Discharge status: Home / Self Care | Payer: Medicare Other | Source: Ambulatory Visit | Attending: Diagnostic Radiology | Admitting: Diagnostic Radiology

## 2011-07-05 ENCOUNTER — Other Ambulatory Visit: Payer: Self-pay | Admitting: Cardiology

## 2011-07-05 ENCOUNTER — Encounter (HOSPITAL_COMMUNITY): Payer: Self-pay

## 2011-07-05 ENCOUNTER — Other Ambulatory Visit (HOSPITAL_COMMUNITY): Payer: Self-pay | Admitting: Diagnostic Radiology

## 2011-07-05 ENCOUNTER — Other Ambulatory Visit (HOSPITAL_COMMUNITY): Payer: Self-pay | Admitting: Oncology

## 2011-07-05 ENCOUNTER — Encounter (HOSPITAL_COMMUNITY)
Admission: RE | Admit: 2011-07-05 | Discharge: 2011-07-05 | Disposition: A | Discharge status: Home / Self Care | Payer: Medicare Other | Source: Ambulatory Visit | Attending: Oncology | Admitting: Oncology

## 2011-07-05 ENCOUNTER — Ambulatory Visit (HOSPITAL_COMMUNITY)
Admission: RE | Admit: 2011-07-05 | Discharge: 2011-07-05 | Disposition: A | Discharge status: Home / Self Care | Payer: Medicare Other | Source: Ambulatory Visit | Attending: Oncology | Admitting: Oncology

## 2011-07-05 DIAGNOSIS — M899 Disorder of bone, unspecified: Secondary | ICD-10-CM

## 2011-07-05 DIAGNOSIS — Z9289 Personal history of other medical treatment: Secondary | ICD-10-CM

## 2011-07-05 DIAGNOSIS — R937 Abnormal findings on diagnostic imaging of other parts of musculoskeletal system: Secondary | ICD-10-CM | POA: Insufficient documentation

## 2011-07-05 DIAGNOSIS — Z87898 Personal history of other specified conditions: Secondary | ICD-10-CM | POA: Insufficient documentation

## 2011-07-05 DIAGNOSIS — E119 Type 2 diabetes mellitus without complications: Secondary | ICD-10-CM | POA: Insufficient documentation

## 2011-07-05 DIAGNOSIS — I10 Essential (primary) hypertension: Secondary | ICD-10-CM | POA: Insufficient documentation

## 2011-07-05 DIAGNOSIS — M8448XA Pathological fracture, other site, initial encounter for fracture: Secondary | ICD-10-CM | POA: Insufficient documentation

## 2011-07-05 DIAGNOSIS — G522 Disorders of vagus nerve: Secondary | ICD-10-CM

## 2011-07-05 HISTORY — DX: Disorder of kidney and ureter, unspecified: N28.9

## 2011-07-05 MED ORDER — TECHNETIUM TC 99M MEDRONATE IV KIT
25.0000 | PACK | Freq: Once | INTRAVENOUS | Status: AC | PRN
  Administered 2011-07-05: 25 via INTRAVENOUS

## 2011-07-08 ENCOUNTER — Other Ambulatory Visit (HOSPITAL_COMMUNITY): Payer: Self-pay | Admitting: Oncology

## 2011-07-08 DIAGNOSIS — R9389 Abnormal findings on diagnostic imaging of other specified body structures: Secondary | ICD-10-CM

## 2011-07-13 ENCOUNTER — Other Ambulatory Visit: Payer: Self-pay | Admitting: Interventional Radiology

## 2011-07-13 ENCOUNTER — Other Ambulatory Visit (HOSPITAL_COMMUNITY): Payer: Self-pay | Admitting: Oncology

## 2011-07-13 ENCOUNTER — Ambulatory Visit (HOSPITAL_COMMUNITY)
Admission: RE | Admit: 2011-07-13 | Discharge: 2011-07-13 | Disposition: A | Discharge status: Home / Self Care | Payer: Medicare Other | Source: Ambulatory Visit | Attending: Oncology | Admitting: Oncology

## 2011-07-13 DIAGNOSIS — K5730 Diverticulosis of large intestine without perforation or abscess without bleeding: Secondary | ICD-10-CM | POA: Insufficient documentation

## 2011-07-13 DIAGNOSIS — K2270 Barrett's esophagus without dysplasia: Secondary | ICD-10-CM | POA: Insufficient documentation

## 2011-07-13 DIAGNOSIS — I10 Essential (primary) hypertension: Secondary | ICD-10-CM | POA: Insufficient documentation

## 2011-07-13 DIAGNOSIS — C8589 Other specified types of non-Hodgkin lymphoma, extranodal and solid organ sites: Secondary | ICD-10-CM | POA: Insufficient documentation

## 2011-07-13 DIAGNOSIS — R9389 Abnormal findings on diagnostic imaging of other specified body structures: Secondary | ICD-10-CM

## 2011-07-13 DIAGNOSIS — Z87891 Personal history of nicotine dependence: Secondary | ICD-10-CM | POA: Insufficient documentation

## 2011-07-13 DIAGNOSIS — R948 Abnormal results of function studies of other organs and systems: Secondary | ICD-10-CM | POA: Insufficient documentation

## 2011-07-13 DIAGNOSIS — Z8673 Personal history of transient ischemic attack (TIA), and cerebral infarction without residual deficits: Secondary | ICD-10-CM | POA: Insufficient documentation

## 2011-07-13 DIAGNOSIS — J4489 Other specified chronic obstructive pulmonary disease: Secondary | ICD-10-CM | POA: Insufficient documentation

## 2011-07-13 DIAGNOSIS — J449 Chronic obstructive pulmonary disease, unspecified: Secondary | ICD-10-CM | POA: Insufficient documentation

## 2011-07-13 DIAGNOSIS — N400 Enlarged prostate without lower urinary tract symptoms: Secondary | ICD-10-CM | POA: Insufficient documentation

## 2011-07-13 DIAGNOSIS — E119 Type 2 diabetes mellitus without complications: Secondary | ICD-10-CM | POA: Insufficient documentation

## 2011-07-13 DIAGNOSIS — N289 Disorder of kidney and ureter, unspecified: Secondary | ICD-10-CM | POA: Insufficient documentation

## 2011-07-13 DIAGNOSIS — K224 Dyskinesia of esophagus: Secondary | ICD-10-CM | POA: Insufficient documentation

## 2011-07-13 LAB — CBC
MCH: 24.8 pg — ABNORMAL LOW (ref 26.0–34.0)
MCHC: 33.6 g/dL (ref 30.0–36.0)
MCV: 73.9 fL — ABNORMAL LOW (ref 78.0–100.0)
Platelets: 186 10*3/uL (ref 150–400)
RBC: 5.44 MIL/uL (ref 4.22–5.81)

## 2011-07-13 LAB — PROTIME-INR: Prothrombin Time: 13.2 seconds (ref 11.6–15.2)

## 2011-07-13 LAB — GLUCOSE, CAPILLARY: Glucose-Capillary: 174 mg/dL — ABNORMAL HIGH (ref 70–99)

## 2011-07-15 ENCOUNTER — Encounter (HOSPITAL_COMMUNITY): Payer: Self-pay | Admitting: Oncology

## 2011-07-15 ENCOUNTER — Encounter (HOSPITAL_COMMUNITY): Payer: Medicare Other | Attending: Oncology | Admitting: Oncology

## 2011-07-15 DIAGNOSIS — D704 Cyclic neutropenia: Secondary | ICD-10-CM

## 2011-07-15 DIAGNOSIS — E8809 Other disorders of plasma-protein metabolism, not elsewhere classified: Secondary | ICD-10-CM | POA: Insufficient documentation

## 2011-07-15 DIAGNOSIS — Z87898 Personal history of other specified conditions: Secondary | ICD-10-CM

## 2011-07-15 DIAGNOSIS — S34112A 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, initial encounter: Secondary | ICD-10-CM

## 2011-07-15 HISTORY — DX: 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, initial encounter: S34.112A

## 2011-07-15 NOTE — Patient Instructions (Addendum)
[facility]  Discharge Instructions  RECOMMENDATIONS MADE BY THE CONSULTANT AND ANY TEST RESULTS WILL BE SENT TO YOUR REFERRING DOCTOR.   EXAM FINDINGS BY MD TODAY AND SIGNS AND SYMPTOMS TO REPORT TO CLINIC OR PRIMARY MD: Biopsy was negative.   MEDICATIONS PRESCRIBED: Nucynta 44m by mouth every 6 hours as needed for pain. 30 tablets. No refills.  INSTRUCTIONS GIVEN AND DISCUSSED: Please follow up with your primary care provider.   Please return to see Dr. NTressie Stalkerin 1 month.   I acknowledge that I have been informed and understand all the instructions given to me and received a copy. I do not have any more questions at this time, but understand that [facility] [phone] business hours should I have any further questions or need assistance in obtaining follow-up care.    __________________________________________  _____________  __________ Signature of Patient or Authorized Representative            Date                   Time    __________________________________________ Nurse's Signature

## 2011-07-15 NOTE — Progress Notes (Signed)
[name], [name] [id number]  1. 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     INTERVAL HISTORY: [name] 65 y.o. male returns for  regular  visit for followup of L2 abnormality worrisome for metastatic disease.  The patient recently underwent a biopsy of the L2 which demonstrated fragments of benign bone and marrow with trilineage hematopoiesis, no atypical lymphoid proliferation identified. He admits to right abdominal discomfort.   The patient is pleased about his benign test results, but he continues to be concerned about this abdominal pain that has been ongoing for 2 years.  I provided the patient with a Rx for Nucynta 50 mg #30 with no refills.  This is a one-time Rx and if his primary care physician deems it fit, he can refill the pain medication.  I provided the patient with a "no more than $25 co-pay" card.  The patient reports that he is weak on his right side secondary to a stroke.  He maintains bowel and bladder continence, but does admit to occasional loss of control.    Past Medical History  Diagnosis Date  . Barrett's esophagus     EGD 2/09 bx proven  . Steatohepatitis   . DM (diabetes mellitus)   . Diverticulosis     TCS 5/08, pancolonic diverticula  . GERD (gastroesophageal reflux disease)   . HTN (hypertension)   . CVA (cerebral infarction) 1998    right sided deficit  . Gout   . Lymphoma     XRT at [facility]  . Hepatitis     esosiniphilic, tx with prednisone  . Stroke     1998  . Renal insufficiency   . 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 07/15/2011    has DM; GOUT, UNSPECIFIED; EOSINOPHILIA; HYPERTENSION; CVA; BARRETTS ESOPHAGUS; DIVERTICULOSIS OF COLON; RECTAL PAIN; HEMATOCHEZIA; RUQ PAIN; ABNORMAL TRANSAMINASE, (LFT'S); BLOOD IN STOOL, OCCULT; LYMPHOMA, HX OF; Hepatitis; and 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 on his problem list.      has no known allergies.  [name] does not  currently have medications on file.  Past Surgical History  Procedure Date  . Cholecystectomy   . Right lymph node removal   . Right video-assisted thoracic surgery, pleurectomy, and pleurodesis 2011    Denies any headaches, dizziness, double vision, fevers, chills, night sweats, nausea, vomiting, diarrhea, constipation, chest pain, heart palpitations, shortness of breath, blood in stool, black tarry stool, urinary pain, urinary burning, urinary frequency, hematuria.   PHYSICAL EXAMINATION  Filed Vitals:   07/15/11 1338  BP: 87/65  Pulse: 65  Temp: 98.6 F (37 C)    GENERAL:well nourished, well developed, comfortable, cooperative and smiling SKIN: skin color, texture, turgor are normal HEAD: Normocephalic, No masses, lesions, tenderness or abnormalities EYES: normal, PERRLA, EOMI EARS: External ears normal OROPHARYNX:mucous membranes are moist  NECK: trachea midline LYMPH:  No epitrochlear nodes noted BREAST:not examined LUNGS: clear to auscultation and percussion HEART: regular rate & rhythm, no murmurs, no gallops, S1 normal and S2 normal ABDOMEN:abdomen soft, non-tender and normal bowel sounds BACK: Back symmetric, no curvature., No CVA tenderness EXTREMITIES:less then 2 second capillary refill, no joint deformities, effusion, or inflammation, no edema, no skin discoloration, no clubbing, no cyanosis  NEURO: alert & oriented x 3 with fluent speech, no focal motor/sensory deficits, gait normal    LABORATORY DATA:   PATHOLOGY: 1. Biopsy of the L2 which demonstrated fragments of benign bone and marrow  with trilineage hematopoiesis, no atypical lymphoid proliferation identified.    ASSESSMENT:  1. L2 Vertebral body abnormality 2. H/O Lymphoma in 1974 3. Stroke, with right sided residual weakness 4. HTN, DM, GERD, Hypercholesterolemia, BPH   PLAN:  1. Rx for Nucynta 50 mg #30 with no refills 2. Return in one month for follow-up   All questions were answered.  The patient knows to call the clinic with any problems, questions or concerns. We can certainly see the patient much sooner if necessary.  The patient and plan discussed with Marcy Panning, MD and he is in agreement with the aforementioned.  I spent 25 minutes counseling the patient face to face. The total time spent in the appointment was 40 minutes.  [name]

## 2011-07-19 ENCOUNTER — Other Ambulatory Visit (HOSPITAL_COMMUNITY): Payer: Self-pay | Admitting: Oncology

## 2011-07-19 DIAGNOSIS — S34112A 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, initial encounter: Secondary | ICD-10-CM

## 2011-07-19 MED ORDER — OXYCODONE-ACETAMINOPHEN 5-325 MG PO TABS: 1.0000 | ORAL_TABLET | ORAL | Status: DC | PRN

## 2011-07-21 ENCOUNTER — Telehealth (HOSPITAL_COMMUNITY): Payer: Self-pay | Admitting: Oncology

## 2011-07-30 LAB — CBC WITH DIFFERENTIAL/PLATELET
Hemoglobin: 13.5 g/dL (ref 13.5–17.5)
MCV: 78.7 fL
WBC: 7.4
platelet count: 212

## 2011-07-30 LAB — LIPID PANEL
Cholesterol: 150 mg/dL (ref 0–200)
HDL: 47 mg/dL (ref 35–70)
LDL Cholesterol: 72 mg/dL
Triglycerides: 153 mg/dL (ref 40–160)

## 2011-07-30 LAB — HEPATIC FUNCTION PANEL: ALT: 12 U/L (ref 10–40)

## 2011-08-08 ENCOUNTER — Encounter (HOSPITAL_COMMUNITY): Payer: Medicare Other

## 2011-08-12 ENCOUNTER — Encounter (HOSPITAL_COMMUNITY): Payer: Self-pay | Admitting: Oncology

## 2011-08-12 ENCOUNTER — Encounter (HOSPITAL_BASED_OUTPATIENT_CLINIC_OR_DEPARTMENT_OTHER): Payer: Medicare Other | Admitting: Oncology

## 2011-08-12 DIAGNOSIS — E8809 Other disorders of plasma-protein metabolism, not elsewhere classified: Secondary | ICD-10-CM

## 2011-08-12 DIAGNOSIS — Z87898 Personal history of other specified conditions: Secondary | ICD-10-CM

## 2011-08-12 NOTE — Progress Notes (Signed)
This office note has been dictated.

## 2011-08-12 NOTE — Patient Instructions (Signed)
[facility]  Discharge Instructions  RECOMMENDATIONS MADE BY THE CONSULTANT AND ANY TEST RESULTS WILL BE SENT TO YOUR REFERRING DOCTOR.   EXAM FINDINGS BY MD TODAY AND SIGNS AND SYMPTOMS TO REPORT TO CLINIC OR PRIMARY MD:   We will be doing lab work today and having you return in 1 month to see the doctor. Please call us one day next week to get your lab results if no one calls you.   I acknowledge that I have been informed and understand all the instructions given to me and received a copy. I do not have any more questions at this time, but understand that I may call the Specialty Clinic at [facility] at [phone] during business hours should I have any further questions or need assistance in obtaining follow-up care.    __________________________________________  _____________  __________ Signature of Patient or Authorized Representative            Date                   Time    __________________________________________ Nurse's Signature

## 2011-08-12 NOTE — Progress Notes (Signed)
Addended by: Gerhard Perches on: 08/12/2011 01:49 PM   Modules accepted: Orders

## 2011-08-12 NOTE — Progress Notes (Signed)
CC:   RGarfield Cornea, MD FACP [facility] Tesfaye D. Legrand Rams, MD  DIAGNOSES: 1. Abnormal L2, though biopsy was actually negative for evidence for     tumor. 2. History of lymphoma in 1974, treated with radiation therapy at     [facility]. 3. Elevated total protein, unclear as to etiology. 4. Hypercalcemia, unclear as to etiology. 5. History of a left-sided stroke with persistent right-sided     weakness, unable to walk. 6. Hypertension. 7. Diabetes mellitus. 8. History of gout, on allopurinol as well as Uloric. 9. Gastroesophageal reflux disease. 10.Hypercholesterolemia, on simvastatin 20 mg a day. 11.Benign prostatic hypertrophy, on tamsulosin 0.4 mg once a day. 12.Poor dental hygiene. 13.Cholecystectomy many years ago.  [name] is here with his wife to go over the results of the biopsy, which were absolutely negative for any evidence for myeloma or lymphoma.  We therefore have a gentleman who had a normal total protein in the spring, now has one of 8.5 and his calcium which was 11.9 in the spring is elevated at 11.2 in July.  We do not have that explained.  He takes no oral calcium and no vitamin D that he is aware of.  He does take a little hydrochlorothiazide, which might explain his hypercalcemia potentially, but it should not explain the total protein, so I think we need to do serum protein electrophoresis, light chain analysis, etc., and also check a PTH level with a simultaneous calcium and see him back here in a month and if we come up with something sooner we will call them.  I do not think there is any point in sending him for vertebroplasty because he is not symptomatic and he is basically unable to walk, but we will still consider that in the future if need be.  We will see him in a month as I mentioned.  We will get the labs today.    ______________________________ Gaston Islam. Tressie Stalker, MD ESN/MEDQ  D:  08/12/2011  T:  08/12/2011  Job:  [id number]

## 2011-08-16 LAB — BUN
AST: 14 U/L
Albumin: 4.1
Alkaline Phosphatase: 80 U/L
BUN, Bld: 23
Creat: 1.25
Eosinophils Manual: 10
Ferritin: 177 ng/mL (ref 18.0–300.0)
Hemoglobin: 13.5 g/dL (ref 13.5–17.5)
Iron: 85
Saturation Ratios: 29
Sodium: 138 mmol/L (ref 137–147)
TIBC: 293
Total Protein ELP: 7.2
Total bilirubin, fluid: 0.4
platelet count: 212

## 2011-08-17 ENCOUNTER — Telehealth (HOSPITAL_COMMUNITY): Payer: Self-pay

## 2011-08-17 LAB — KAPPA/LAMBDA LIGHT CHAINS
Kappa free light chain: 3.53 mg/dL — ABNORMAL HIGH (ref 0.33–1.94)
Kappa, lambda light chain ratio: 1.8 — ABNORMAL HIGH (ref 0.26–1.65)

## 2011-08-17 NOTE — Telephone Encounter (Signed)
This should be impossible!

## 2011-08-17 NOTE — Telephone Encounter (Signed)
Critical lab of Total Protein Level of 53.6 - call received from lab @ 1500.

## 2011-08-18 LAB — MULTIPLE MYELOMA PANEL, SERUM
Albumin ELP: 53.6 % — ABNORMAL LOW (ref 55.8–66.1)
Alpha-1-Globulin: 4.1 % (ref 2.9–4.9)
Beta Globulin: 5.7 % (ref 4.7–7.2)
IgG (Immunoglobin G), Serum: 1650 mg/dL — ABNORMAL HIGH (ref 650–1600)
Total Protein: 8 g/dL (ref 6.0–8.3)

## 2011-09-02 LAB — CBC
HCT: 44.5
MCHC: 31.8
MCV: 75.9 — ABNORMAL LOW
Platelets: 277
RDW: 18.4 — ABNORMAL HIGH

## 2011-09-02 LAB — PROTIME-INR: Prothrombin Time: 12

## 2011-09-09 ENCOUNTER — Ambulatory Visit (HOSPITAL_COMMUNITY): Payer: Medicare Other | Admitting: Oncology

## 2011-09-15 NOTE — Progress Notes (Signed)
Quick Note:  B12 borderline low.   Recheck B12 in six months.  OV with RMR first available, routine appt. ______

## 2011-09-16 LAB — COMPREHENSIVE METABOLIC PANEL WITH GFR
ALT: 133 — ABNORMAL HIGH
AST: 51 — ABNORMAL HIGH
Albumin: 3.2 — ABNORMAL LOW
Alkaline Phosphatase: 95
BUN: 21
CO2: 27
Calcium: 10.9 — ABNORMAL HIGH
Chloride: 95 — ABNORMAL LOW
Creatinine, Ser: 1.3
GFR calc non Af Amer: 56 — ABNORMAL LOW
Glucose, Bld: 335 — ABNORMAL HIGH
Potassium: 3.9
Sodium: 132 — ABNORMAL LOW
Total Bilirubin: 0.5
Total Protein: 6.7

## 2011-09-16 LAB — DIFFERENTIAL
Basophils Absolute: 0
Basophils Absolute: 0
Basophils Relative: 0
Eosinophils Absolute: 0
Eosinophils Relative: 0
Eosinophils Relative: 2
Lymphocytes Relative: 12
Lymphocytes Relative: 8 — ABNORMAL LOW
Lymphs Abs: 1.2
Lymphs Abs: 1.2
Monocytes Absolute: 0.6
Monocytes Absolute: 0.7
Monocytes Relative: 4
Monocytes Relative: 7
Neutro Abs: 13.9 — ABNORMAL HIGH
Neutro Abs: 8 — ABNORMAL HIGH
Neutrophils Relative %: 88 — ABNORMAL HIGH

## 2011-09-16 LAB — BASIC METABOLIC PANEL
Calcium: 10.5
GFR calc Af Amer: >60
GFR calc non Af Amer: >60
Potassium: 4
Sodium: 135

## 2011-09-16 LAB — URINALYSIS, ROUTINE W REFLEX MICROSCOPIC
Bilirubin Urine: NEGATIVE
Ketones, ur: NEGATIVE
Leukocytes, UA: NEGATIVE
Nitrite: NEGATIVE
Nitrite: NEGATIVE
Specific Gravity, Urine: >1.03 — ABNORMAL HIGH
Urobilinogen, UA: 0.2
Urobilinogen, UA: 0.2
pH: 5.5
pH: 6

## 2011-09-16 LAB — CBC
HCT: 43.6
HCT: 44.5
Hemoglobin: 14
Hemoglobin: 14.2
MCHC: 31.9
MCV: 75.6 — ABNORMAL LOW
Platelets: 163
RBC: 5.78
RBC: 5.89 — ABNORMAL HIGH
RDW: 18.4 — ABNORMAL HIGH
WBC: 10.1
WBC: 15.8 — ABNORMAL HIGH

## 2011-09-16 LAB — HEPATIC FUNCTION PANEL
ALT: 148 — ABNORMAL HIGH
AST: 99 — ABNORMAL HIGH
Alkaline Phosphatase: 62
Bilirubin, Direct: 0.2
Total Bilirubin: 0.9

## 2011-09-16 LAB — URINE MICROSCOPIC-ADD ON

## 2011-09-16 LAB — AMMONIA: Ammonia: 25

## 2011-09-19 ENCOUNTER — Other Ambulatory Visit: Payer: Self-pay | Admitting: Gastroenterology

## 2011-09-19 ENCOUNTER — Encounter: Payer: Self-pay | Admitting: Internal Medicine

## 2011-09-19 DIAGNOSIS — D518 Other vitamin B12 deficiency anemias: Secondary | ICD-10-CM

## 2011-09-29 LAB — BASIC METABOLIC PANEL
BUN: 17
Creatinine, Ser: 0.99
GFR calc non Af Amer: >60
Glucose, Bld: 116 — ABNORMAL HIGH
Potassium: 4

## 2011-09-29 LAB — URIC ACID: Uric Acid, Serum: 5.5

## 2011-09-29 LAB — CBC
HCT: 43.7
Platelets: 309
RDW: 17.8 — ABNORMAL HIGH

## 2011-09-29 LAB — DIFFERENTIAL
Basophils Absolute: 0
Eosinophils Absolute: 0.3
Eosinophils Relative: 3
Lymphocytes Relative: 12
Neutrophils Relative %: 79 — ABNORMAL HIGH

## 2011-10-14 ENCOUNTER — Encounter: Payer: Self-pay | Admitting: Internal Medicine

## 2011-10-17 ENCOUNTER — Ambulatory Visit (INDEPENDENT_AMBULATORY_CARE_PROVIDER_SITE_OTHER): Payer: Medicare Other | Admitting: Internal Medicine

## 2011-10-17 DIAGNOSIS — R1011 Right upper quadrant pain: Secondary | ICD-10-CM

## 2011-10-17 NOTE — Patient Instructions (Signed)
Stop omeprazole; 2 week trial of AcipHex 20 mg orally daily to see if this makes any difference with his abdominal pain-samples provided from the office  If this is not helpful as far as his abdominal pain is concerned, I would recommend seeing a pain management physician.

## 2011-10-17 NOTE — Progress Notes (Signed)
Primary Care Physician:  [name], MD Primary Gastroenterologist:  Dr.   Pre-Procedure History & Physical: HPI:  [name] is a 65 y.o. male here for followup of right-sided abdominal pain. He has known Barrett's esophagus without dysplasia seen on recent EGD also tubular adenoma removed from his colon (poor prep) He has had vague right-sided bowel pain for many years. Gallbladder is long gone. LFTs recently normal. No dilation of his biliary tree; he has hepatic steatosis. Has been on omeprazole for years as well. He feels abdominal pain may be vaguely increased with a meal  but is more or less unrelenting -  not associated with other activities has one bowel movement daily. No trauma.  He's had extensive evaluation for elevated liver enzymes in the past as chronicled in her last office note and, again, his last LFTs were completely normal. He does have a borderline low vitamin B12 level which is being followed up on. Back in August of this year his  white count, hemoglobin and hematocrit came back completely normal; his iron studies were also normal. He is being followed by Dr. Tressie Stalker for an L2 abnormality and  Hypercalcemia.  Past Medical History  Diagnosis Date  . Barrett's esophagus     EGD 03/23/2011 & EGD 2/09 bx proven  . Steatohepatitis   . DM (diabetes mellitus)   . Diverticulosis     TCS 03/23/11 pancolonic diverticula &TCS 5/08, pancolonic diverticula  . GERD (gastroesophageal reflux disease)   . HTN (hypertension)   . CVA (cerebral infarction) 1998    right sided deficit  . Gout   . Lymphoma     XRT at [facility]  . Hepatitis     esosiniphilic, tx with prednisone  . Stroke     1998  . Renal insufficiency   . 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 Hiatal hernia   . Colon polyp 03/23/2011    tubular adenoma, Dr. Gala Romney  . Hemorrhoids, internal 03/23/2011    tcs by Dr. Gala Romney    Past Surgical History  Procedure Date  . Cholecystectomy   . Right  lymph node removal   . Right video-assisted thoracic surgery, pleurectomy, and pleurodesis 2011  . Esophagogastroduodenoscopy 02/05/08    goblet cell metaplasia/negative for H.pylori  . Colonoscopy 03/23/11    Dr. Gala Romney  pancolonic diverticula, hemorrhoids, tubular adenoma.  . Esophagogastroduodenoscopy 03/23/11    Dr. Gala Romney, barretts, hiatal hernia    Prior to Admission medications   Medication Sig Start Date End Date Taking? Authorizing Provider  allopurinol (ZYLOPRIM) 300 MG tablet Take 300 mg by mouth daily.     Yes Historical Provider, MD  amLODipine (NORVASC) 10 MG tablet Take 10 mg by mouth daily.     Yes Historical Provider, MD  cloNIDine (CATAPRES) 0.2 MG tablet Take 0.2 mg by mouth 3 (three) times daily.     Yes Historical Provider, MD  febuxostat (ULORIC) 40 MG tablet Take 40 mg by mouth daily.     Yes Historical Provider, MD  glyBURIDE (DIABETA) 5 MG tablet Take 5 mg by mouth 2 (two) times daily with a meal.    Yes Historical Provider, MD  hydrochlorothiazide 25 MG tablet Take 25 mg by mouth daily.     Yes Historical Provider, MD  ibuprofen (ADVIL,MOTRIN) 200 MG tablet Take 200 mg by mouth every 6 (six) hours as needed.     Yes Historical Provider, MD  insulin glargine (LANTUS) 100 UNIT/ML injection Inject 30 Units into  the skin at bedtime.    Yes Historical Provider, MD  linagliptin (TRADJENTA) 5 MG TABS tablet Take 5 mg by mouth daily.     Yes Historical Provider, MD  lisinopril (PRINIVIL,ZESTRIL) 20 MG tablet Take 20 mg by mouth daily.     Yes Historical Provider, MD  metoprolol (TOPROL-XL) 50 MG 24 hr tablet Take 50 mg by mouth daily.     Yes Historical Provider, MD  omeprazole (PRILOSEC) 40 MG capsule Take 40 mg by mouth daily.     Yes Historical Provider, MD  simvastatin (ZOCOR) 20 MG tablet Take 20 mg by mouth at bedtime.     Yes Historical Provider, MD  Tamsulosin HCl (FLOMAX) 0.4 MG CAPS Take 0.4 mg by mouth daily after breakfast.     Yes Historical Provider, MD     Allergies as of 10/17/2011  . (No Known Allergies)    Family History  Problem Relation Age of Onset  . Heart failure Father   . Heart failure Mother   . Heart failure Sister   . Heart failure Son     History   Social History  . Marital Status: Married    Spouse Name: N/A    Number of Children: 1  . Years of Education: N/A   Occupational History  . disabled    Social History Main Topics  . Smoking status: Former Smoker -- 3.0 packs/day    Types: Cigarettes    Quit date: 03/01/1997  . Smokeless tobacco: Never Used  . Alcohol Use: No  . Drug Use: No  . Sexually Active: No   Other Topics Concern  . Not on file   Social History Narrative  . No narrative on file    Review of Systems: See HPI, otherwise negative ROS  Physical Exam: BP 119/78  Pulse 82  Temp(Src) 98 F (36.7 C) (Temporal)  Ht 6' (1.829 m) General:   Alert,  Well-developed, well-nourished, pleasant and cooperative in NAD Eyes:  Sclera clear, no icterus.   Conjunctiva pink. Ears:  Normal auditory acuity. Neck:  Supple; no masses or thyromegaly. Lungs:  Clear throughout to auscultation.   No wheezes, crackles, or rhonchi. No acute distress. Heart:  Regular rate and rhythm; no murmurs, clicks, rubs,  or gallops. Abdomen: Msk:  Right hemiplegia; listing to the left Pulses:  Normal pulses noted. Extremities:  Without clubbing or edema. Neurologic:  Alert and  oriented x4;  grossly normal neurologically.  Impression/Plan:

## 2011-10-17 NOTE — Assessment & Plan Note (Signed)
Chronic right upper quadrant abdominal pain. I doubt his pain is related to any active visceral gastrointestinal process. I would be more concerned about at least in part a musculoskeletal or neuropathic component related to his posturing and neurological deficit from his CVA. In addition, radicular pain would also remain in the differential. I do not believe his pain is emanating from his liver. He has no other associated GI symptoms.  Recommendations: For completion sake, we'll have him stop omeprazole and try 2 week course of AcipHex 20 mg orally daily just to make sure that this is not drug induced abdominal pain (possible, but not very  Likely).  If abdominal pain persists, would advocate referral to a pain management physician.

## 2011-10-22 ENCOUNTER — Emergency Department (HOSPITAL_COMMUNITY)
Admission: EM | Admit: 2011-10-22 | Discharge: 2011-10-22 | Disposition: A | Discharge status: Home / Self Care | Payer: Medicare Other | Attending: Emergency Medicine | Admitting: Emergency Medicine

## 2011-10-22 ENCOUNTER — Encounter (HOSPITAL_COMMUNITY): Payer: Self-pay | Admitting: *Deleted

## 2011-10-22 ENCOUNTER — Emergency Department (HOSPITAL_COMMUNITY): Payer: Medicare Other

## 2011-10-22 DIAGNOSIS — R4701 Aphasia: Secondary | ICD-10-CM | POA: Insufficient documentation

## 2011-10-22 DIAGNOSIS — Z9889 Other specified postprocedural states: Secondary | ICD-10-CM | POA: Insufficient documentation

## 2011-10-22 DIAGNOSIS — I10 Essential (primary) hypertension: Secondary | ICD-10-CM | POA: Insufficient documentation

## 2011-10-22 DIAGNOSIS — E119 Type 2 diabetes mellitus without complications: Secondary | ICD-10-CM | POA: Insufficient documentation

## 2011-10-22 DIAGNOSIS — R209 Unspecified disturbances of skin sensation: Secondary | ICD-10-CM | POA: Insufficient documentation

## 2011-10-22 DIAGNOSIS — R109 Unspecified abdominal pain: Secondary | ICD-10-CM | POA: Insufficient documentation

## 2011-10-22 DIAGNOSIS — Z8639 Personal history of other endocrine, nutritional and metabolic disease: Secondary | ICD-10-CM | POA: Insufficient documentation

## 2011-10-22 DIAGNOSIS — K219 Gastro-esophageal reflux disease without esophagitis: Secondary | ICD-10-CM | POA: Insufficient documentation

## 2011-10-22 DIAGNOSIS — R10813 Right lower quadrant abdominal tenderness: Secondary | ICD-10-CM | POA: Insufficient documentation

## 2011-10-22 DIAGNOSIS — Z79899 Other long term (current) drug therapy: Secondary | ICD-10-CM | POA: Insufficient documentation

## 2011-10-22 DIAGNOSIS — Z862 Personal history of diseases of the blood and blood-forming organs and certain disorders involving the immune mechanism: Secondary | ICD-10-CM | POA: Insufficient documentation

## 2011-10-22 DIAGNOSIS — Z87898 Personal history of other specified conditions: Secondary | ICD-10-CM | POA: Insufficient documentation

## 2011-10-22 DIAGNOSIS — N390 Urinary tract infection, site not specified: Secondary | ICD-10-CM | POA: Insufficient documentation

## 2011-10-22 DIAGNOSIS — Z8673 Personal history of transient ischemic attack (TIA), and cerebral infarction without residual deficits: Secondary | ICD-10-CM | POA: Insufficient documentation

## 2011-10-22 LAB — URINE MICROSCOPIC-ADD ON

## 2011-10-22 LAB — COMPREHENSIVE METABOLIC PANEL
ALT: 18 U/L (ref 0–53)
AST: 16 U/L (ref 0–37)
Albumin: 3.6 g/dL (ref 3.5–5.2)
Alkaline Phosphatase: 112 U/L (ref 39–117)
Chloride: 97 mEq/L (ref 96–112)
Potassium: 4 mEq/L (ref 3.5–5.1)
Sodium: 135 mEq/L (ref 135–145)
Total Bilirubin: 0.2 mg/dL — ABNORMAL LOW (ref 0.3–1.2)

## 2011-10-22 LAB — URINALYSIS, ROUTINE W REFLEX MICROSCOPIC
Bilirubin Urine: NEGATIVE
Glucose, UA: >1000 mg/dL — AB
Leukocytes, UA: NEGATIVE
pH: 5.5 (ref 5.0–8.0)

## 2011-10-22 LAB — DIFFERENTIAL
Basophils Absolute: 0 10*3/uL (ref 0.0–0.1)
Basophils Relative: 0 % (ref 0–1)
Neutro Abs: 4.1 10*3/uL (ref 1.7–7.7)
Neutrophils Relative %: 59 % (ref 43–77)

## 2011-10-22 LAB — CBC
MCHC: 31.4 g/dL (ref 30.0–36.0)
RDW: 16.4 % — ABNORMAL HIGH (ref 11.5–15.5)

## 2011-10-22 MED ORDER — ONDANSETRON HCL 4 MG/2ML IJ SOLN
4.0000 mg | Freq: Once | INTRAMUSCULAR | Status: AC
  Administered 2011-10-22: 4 mg via INTRAVENOUS
  Filled 2011-10-22: qty 2

## 2011-10-22 MED ORDER — CEFIXIME 400 MG PO TABS: 400.0000 mg | ORAL_TABLET | Freq: Every day | ORAL | Status: AC

## 2011-10-22 MED ORDER — DEXTROSE 5 % IV SOLN
1.0000 g | INTRAVENOUS | Status: DC
  Administered 2011-10-22: 1 g via INTRAVENOUS
  Filled 2011-10-22: qty 10

## 2011-10-22 MED ORDER — SODIUM CHLORIDE 0.9 % IV SOLN
999.0000 mL | INTRAVENOUS | Status: DC
  Administered 2011-10-22: 999 mL via INTRAVENOUS

## 2011-10-22 MED ORDER — SODIUM CHLORIDE 0.9 % IV BOLUS (SEPSIS)
1000.0000 mL | Freq: Once | INTRAVENOUS | Status: AC
  Administered 2011-10-22: 1000 mL via INTRAVENOUS

## 2011-10-22 MED ORDER — IOHEXOL 300 MG/ML  SOLN
80.0000 mL | Freq: Once | INTRAMUSCULAR | Status: AC | PRN
  Administered 2011-10-22: 80 mL via INTRAVENOUS

## 2011-10-22 MED ORDER — HYDROCODONE-ACETAMINOPHEN 5-325 MG PO TABS: 2.0000 | ORAL_TABLET | ORAL | Status: DC | PRN

## 2011-10-22 MED ORDER — HYDROCODONE-ACETAMINOPHEN 5-325 MG PO TABS: 1.0000 | ORAL_TABLET | Freq: Four times a day (QID) | ORAL | Status: AC | PRN

## 2011-10-22 MED ORDER — HYDROMORPHONE HCL PF 1 MG/ML IJ SOLN
1.0000 mg | Freq: Once | INTRAMUSCULAR | Status: AC
  Administered 2011-10-22: 1 mg via INTRAVENOUS
  Filled 2011-10-22: qty 1

## 2011-10-22 NOTE — ED Provider Notes (Signed)
History     CSN: [id number] Arrival date & time: 10/22/2011  5:38 PM   First MD Initiated Contact with Patient 10/22/11 1742      Chief Complaint  Patient presents with  . Abdominal Pain    (Consider location/radiation/quality/duration/timing/severity/associated sxs/prior treatment) Patient is a 65 y.o. male presenting with abdominal pain. The history is provided by the patient.  Abdominal Pain The primary symptoms of the illness include abdominal pain. The primary symptoms of the illness do not include fever, nausea, vomiting, diarrhea or dysuria. Episode onset: today. The onset of the illness was gradual. The problem has not changed since onset. The abdominal pain is located in the RUQ and RLQ. The abdominal pain does not radiate.  Symptoms associated with the illness do not include chills, anorexia, diaphoresis, constipation, urgency, hematuria or back pain.   the family notices that his right abdomen appears to be swollen as well. There has not been any recent trauma.  Past Medical History  Diagnosis Date  . Barrett's esophagus     EGD 03/23/2011 & EGD 2/09 bx proven  . Steatohepatitis   . DM (diabetes mellitus)   . Diverticulosis     TCS 03/23/11 pancolonic diverticula &TCS 5/08, pancolonic diverticula  . GERD (gastroesophageal reflux disease)   . HTN (hypertension)   . CVA (cerebral infarction) 1998    right sided deficit  . Gout   . Lymphoma     XRT at [facility]  . Hepatitis     esosiniphilic, tx with prednisone  . Stroke     1998  . Renal insufficiency   . 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 Hiatal hernia   . Colon polyp 03/23/2011    tubular adenoma, Dr. Gala Romney  . Hemorrhoids, internal 03/23/2011    tcs by Dr. Gala Romney    Past Surgical History  Procedure Date  . Cholecystectomy   . Right lymph node removal   . Right video-assisted thoracic surgery, pleurectomy, and pleurodesis 2011  . Esophagogastroduodenoscopy 02/05/08    goblet cell  metaplasia/negative for H.pylori  . Colonoscopy 03/23/11    Dr. Gala Romney  pancolonic diverticula, hemorrhoids, tubular adenoma.  . Esophagogastroduodenoscopy 03/23/11    Dr. Gala Romney, barretts, hiatal hernia    Family History  Problem Relation Age of Onset  . Heart failure Father   . Heart failure Mother   . Heart failure Sister   . Heart failure Son     History  Substance Use Topics  . Smoking status: Former Smoker -- 3.0 packs/day    Types: Cigarettes    Quit date: 03/01/1997  . Smokeless tobacco: Never Used  . Alcohol Use: No      Review of Systems  Constitutional: Negative for fever, chills and diaphoresis.  Gastrointestinal: Positive for abdominal pain. Negative for nausea, vomiting, diarrhea, constipation and anorexia.  Genitourinary: Negative for dysuria, urgency and hematuria.  Musculoskeletal: Negative for back pain.  All other systems reviewed and are negative.    Allergies  Review of patient's allergies indicates no known allergies.  Home Medications   Current Outpatient Rx  Name Route Sig Dispense Refill  . ALLOPURINOL 300 MG PO TABS Oral Take 300 mg by mouth daily.      [name] AMLODIPINE BESYLATE 10 MG PO TABS Oral Take 10 mg by mouth daily.      [name] CLONIDINE HCL 0.2 MG PO TABS Oral Take 0.2 mg by mouth 3 (three) times daily.      . FEBUXOSTAT 40  MG PO TABS Oral Take 40 mg by mouth daily.      . GLYBURIDE 5 MG PO TABS Oral Take 5 mg by mouth 2 (two) times daily with a meal.     . HYDROCHLOROTHIAZIDE 25 MG PO TABS Oral Take 25 mg by mouth daily.      . IBUPROFEN 200 MG PO TABS Oral Take 200 mg by mouth every 6 (six) hours as needed.      . INSULIN GLARGINE 100 UNIT/ML [place] SOLN Subcutaneous Inject 30 Units into the skin at bedtime.     [name] LINAGLIPTIN 5 MG PO TABS Oral Take 5 mg by mouth daily.      [name] LISINOPRIL 20 MG PO TABS Oral Take 20 mg by mouth daily.      [name] METOPROLOL SUCCINATE 50 MG PO TB24 Oral Take 50 mg by mouth daily.      [name] OMEPRAZOLE 40 MG PO CPDR Oral  Take 40 mg by mouth daily.      [name] SIMVASTATIN 20 MG PO TABS Oral Take 20 mg by mouth at bedtime.      . TAMSULOSIN HCL 0.4 MG PO CAPS Oral Take 0.4 mg by mouth daily after breakfast.        BP 124/77  Pulse 94  SpO2 94%  Physical Exam  Nursing note and vitals reviewed. Constitutional: He appears well-developed and well-nourished. No distress.  HENT:  Head: Normocephalic and atraumatic.  Right Ear: External ear normal.  Left Ear: External ear normal.  Eyes: Conjunctivae are normal. Right eye exhibits no discharge. Left eye exhibits no discharge. No scleral icterus.  Neck: Neck supple. No tracheal deviation present.  Cardiovascular: Normal rate, regular rhythm and intact distal pulses.   Pulmonary/Chest: Effort normal and breath sounds normal. No stridor. No respiratory distress. He has no wheezes. He has no rales.  Abdominal: Soft. Bowel sounds are normal. He exhibits no distension. There is tenderness in the right lower quadrant. There is guarding. There is no rigidity, no rebound and no CVA tenderness. No hernia.  Musculoskeletal: He exhibits no edema and no tenderness.  Neurological: He is alert. He has normal strength. No sensory deficit. He exhibits normal muscle tone. He displays no seizure activity. Coordination normal.       Right hemiparesthesias, slight aphasia  Skin: Skin is warm and dry. No rash noted.  Psychiatric: He has a normal mood and affect.    ED Course  Procedures (including critical care time)  Labs Reviewed  CBC - Abnormal; Notable for the following:    MCV 74.9 (*)    MCH 23.5 (*)    RDW 16.4 (*)    All other components within normal limits  DIFFERENTIAL - Abnormal; Notable for the following:    Eosinophils Relative 8 (*)    All other components within normal limits  COMPREHENSIVE METABOLIC PANEL - Abnormal; Notable for the following:    Glucose, Bld 284 (*)    Creatinine, Ser 1.78 (*)    Calcium 11.3 (*)    Total Bilirubin 0.2 (*)    GFR calc non  Af Amer 38 (*)    GFR calc Af Amer 44 (*)    All other components within normal limits  URINALYSIS, ROUTINE W REFLEX MICROSCOPIC - Abnormal; Notable for the following:    Glucose, UA >1000 (*)    Ketones, ur TRACE (*)    All other components within normal limits  URINE MICROSCOPIC-ADD ON - Abnormal; Notable for the following:  Squamous Epithelial / LPF FEW (*)    Casts HYALINE CASTS (*)    All other components within normal limits  LIPASE, BLOOD   Ct Abdomen Pelvis W Contrast  10/22/2011  *RADIOLOGY REPORT*  Clinical Data: Right-sided abdominal pain.  CT ABDOMEN AND PELVIS WITH CONTRAST  Technique:  Multidetector CT imaging of the abdomen and pelvis was performed following the standard protocol during bolus administration of intravenous contrast.  Contrast: 72m OMNIPAQUE IOHEXOL 300 MG/ML IV SOLN 80 ml Omnipaque- 300  Comparison: CT abdomen 05/08/2011.  Findings: The lung bases are clear.  No pleural effusion.  The solid abdominal organs are intact and appear stable.  Bilateral renal cysts are noted.  The gallbladder is surgically absent.  No common bile duct dilatation.  The stomach, duodenum, small bowel and colon are unremarkable. There is scattered colonic diverticulosis and a moderate amount of stool.  No findings for mass lesion or inflammatory change.  The appendix is normal.  No mesenteric or retroperitoneal masses or lymphadenopathy.  The aorta is normal in caliber.  The major branch vessels are normal.  The bladder, prostate gland and seminal vesicles are unremarkable and stable.  No pelvic mass or adenopathy.  The bony structures are intact.  IMPRESSION: No acute abdominal/pelvic findings, mass lesions or adenopathy.  Original Report Authenticated By: P. MKalman Jewels M.D.    Medications  0.9 %  sodium chloride infusion (999 mL Intravenous New Bag 10/22/11 1853)  RABEprazole (ACIPHEX) 20 MG tablet (not administered)  Misc Natural Products (COLON CLEANSER PO) (not administered)    HYDROcodone-acetaminophen (NORCO) 5-325 MG per tablet (not administered)  sodium chloride 0.9 % bolus 1,000 mL (not administered)  cefTRIAXone (ROCEPHIN) 1 g in dextrose 5 % 50 mL IVPB (not administered)  HYDROmorphone (DILAUDID) injection 1 mg (1 mg Intravenous Given 10/22/11 1852)  ondansetron (ZOFRAN) injection 4 mg (4 mg Intravenous Given 10/22/11 1852)  iohexol (OMNIPAQUE) 300 MG/ML injection 80 mL (80 mL Intravenous Contrast Given 10/22/11 1925)     1. Urinary tract infection       MDM  Patient without any acute abnormalities on the CT scan. He does appear to have a urinary tract infection as noted on the urinalysis. The patient at baseline has some renal insufficiency as well. He was given a dose of antibiotics here in emergent. He'll be discharged home with pain medications and oral antibiotics the        JKathalene Frames MD 10/22/11 2155

## 2011-10-22 NOTE — ED Notes (Signed)
Pt c/o severe abd pain. Pt states right side of abd very tender to touch. Pts abd appears to be swollen on right side.

## 2011-10-22 NOTE — ED Notes (Signed)
Pt reporting improvement in nausea symptoms.  Presently denies any nausea or pain. Pt discharged.

## 2011-10-22 NOTE — ED Notes (Signed)
Pt became nauseated and vomited small amount.  Dr. Tomi Bamberger notified, pt to be discharged, but will delay until verified medication effective.

## 2011-12-23 IMAGING — CT CT ABD-PELV W/ CM
2 of 4 series · 16 of 46 positions shown, 18 images · IV contrast (Omnipaque 300)
Comparison: 12/11/2007

CLINICAL DATA: Abdominal pain, constipation, loss of appetite,
hypertension, diabetes, prior cholecystectomy and stroke

CT ABDOMEN AND PELVIS WITH CONTRAST
TECHNIQUE: Multidetector CT imaging of the abdomen and pelvis was
performed following the standard protocol during bolus
administration of intravenous contrast. Sagittal and coronal MPR
images reconstructed from axial data set.
Contrast: 80 ml Omnipaque 300 IV; No oral contrast administered.

[Series 2: abd_pel_with 5.0 b40f · axial · 0.88mm/px · z∈[+678,+1118]mm · 13 of 100 slices shown, 15 images]
[im 6/100  soft-tissue]
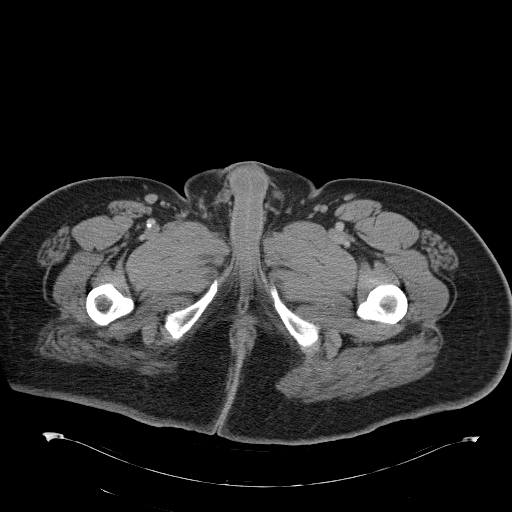
[im 6/100  bone]
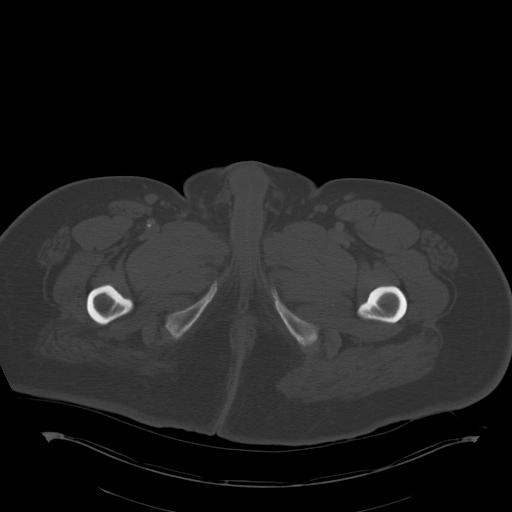
[im 16/100  soft-tissue]
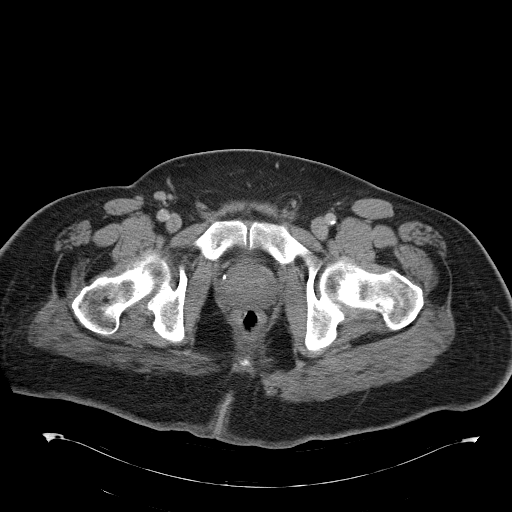
[im 21/100  soft-tissue]
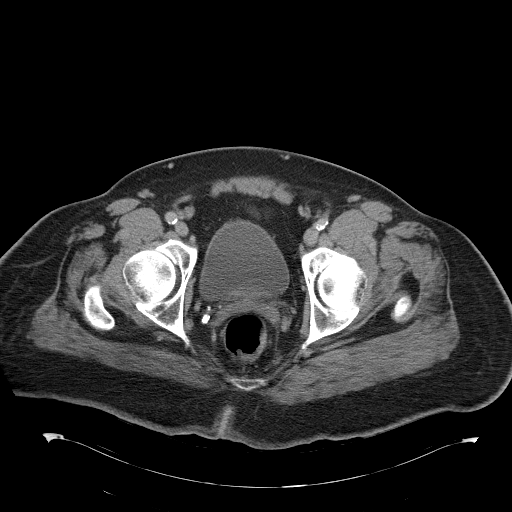
[im 27/100  soft-tissue]
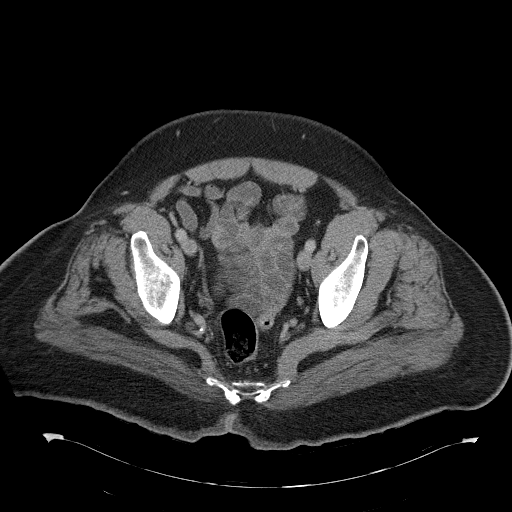
[im 37/100  soft-tissue]
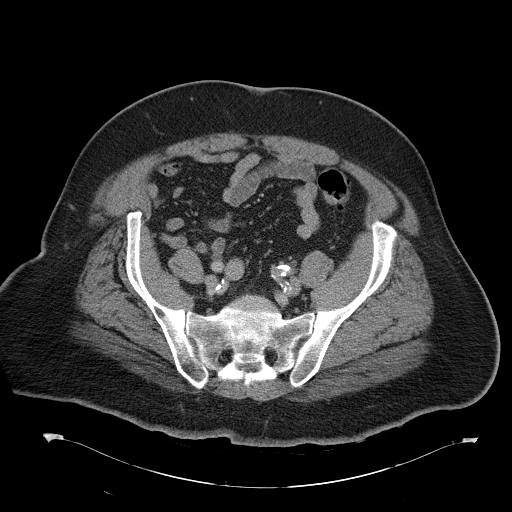
[im 42/100  soft-tissue]
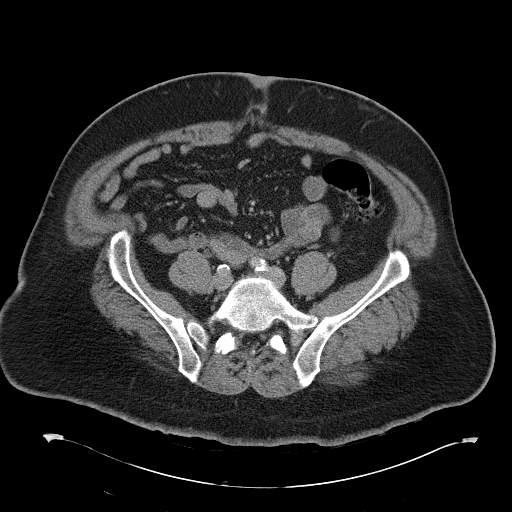
[im 53/100  soft-tissue]
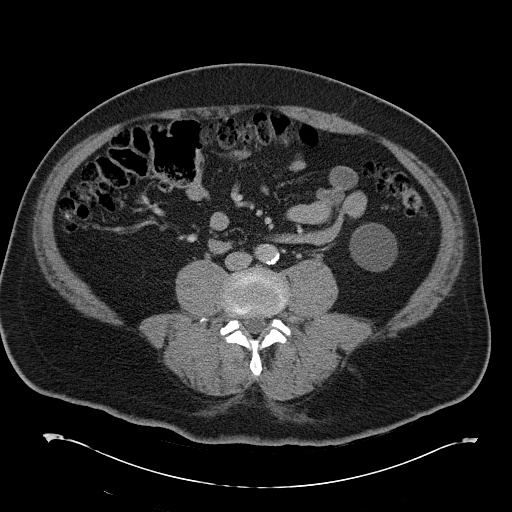
[im 58/100  soft-tissue]
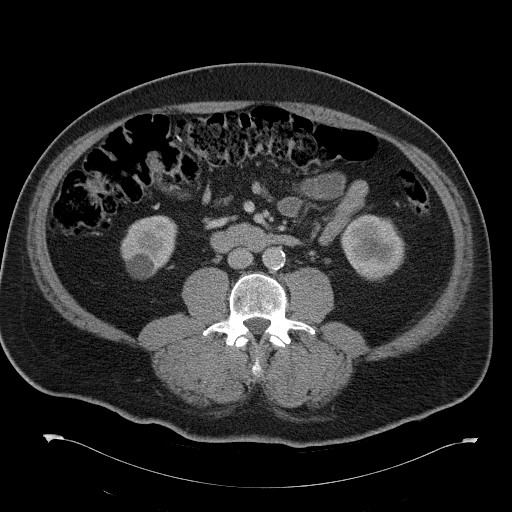
[im 63/100  soft-tissue]
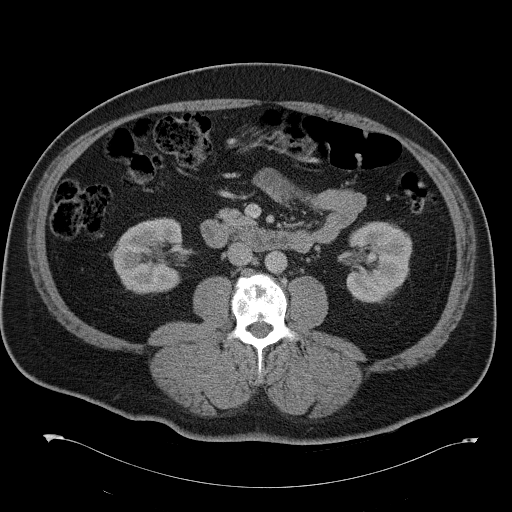
[im 63/100  bone]
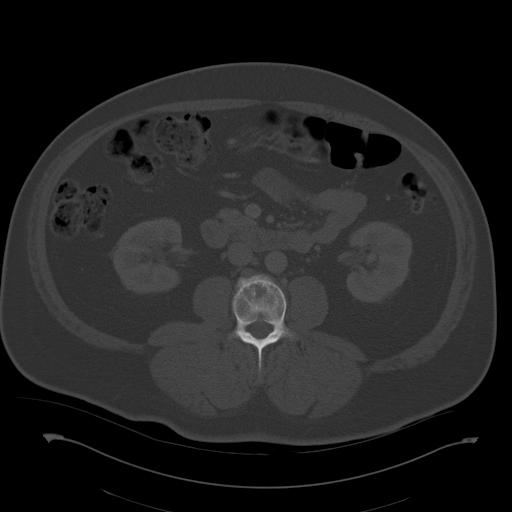
[im 73/100  soft-tissue]
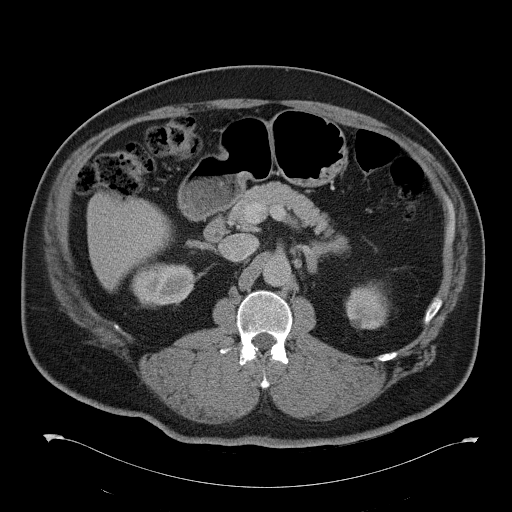
[im 79/100  soft-tissue]
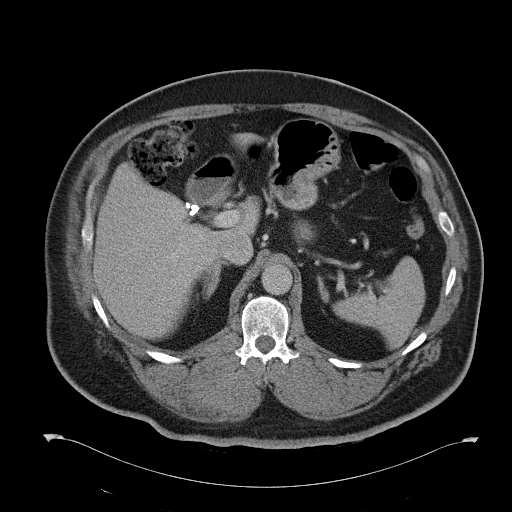
[im 84/100  soft-tissue]
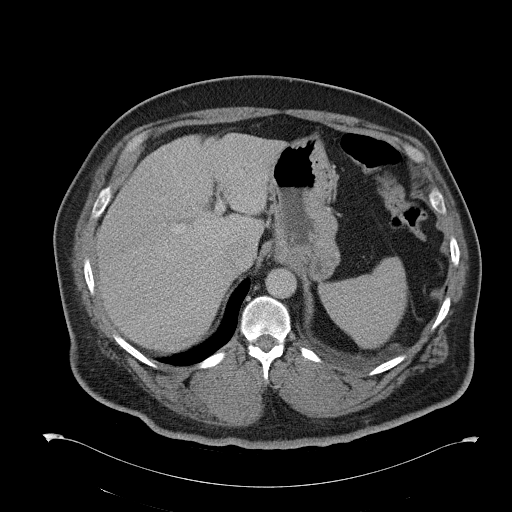
[im 94/100  soft-tissue]
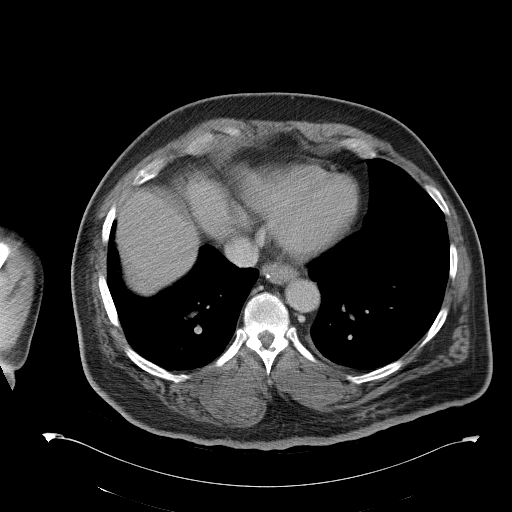

[Series 4: abd_pel_with 3.0 spo cor · coronal · 0.96mm/px · 3 of 117 slices shown]
[im 39/117  soft-tissue]
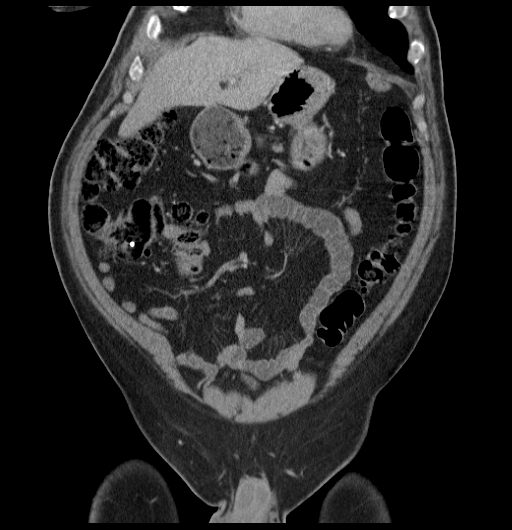
[im 52/117  soft-tissue]
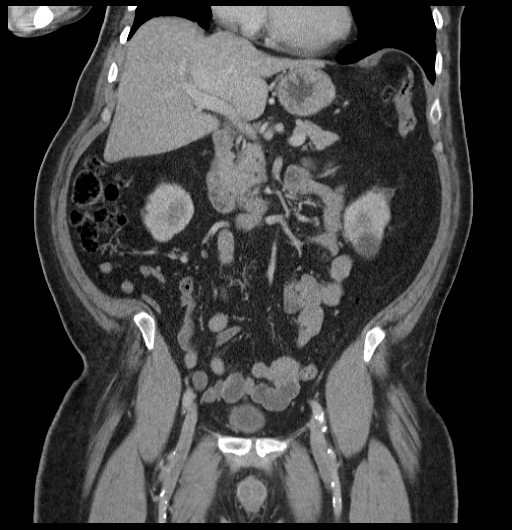
[im 65/117  soft-tissue]
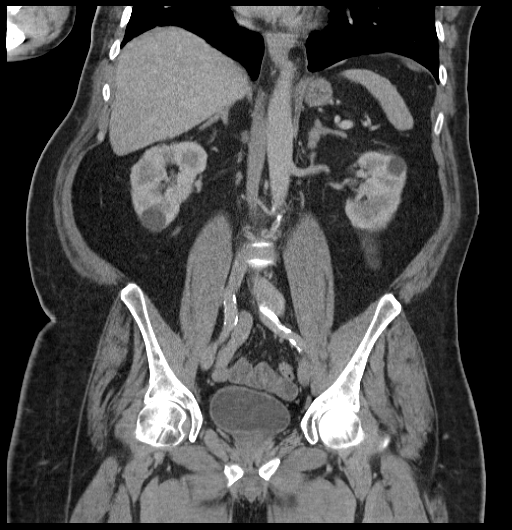

[16 of 46 positions shown; findings below may reference images not displayed]

FINDINGS: Mild atelectasis left lung base.
Gallbladder surgically absent.
Bilateral renal cysts, largest lower pole left kidney 4.1 x 4.0 cm
image 45. Tiny nonobstructing calculus upper pole right kidney
image 32.
Question tiny cyst in the liver 7 mm left lobe image 14.
Remainder of liver, spleen, pancreas, and kidneys unremarkable.
Thickening of bilateral adrenal glands without focal mass.
Stomach incompletely distended, suboptimal assessment of proximal
wall thickness.
Normal appendix.

Scattered atherosclerotic calcifications without aneurysm.
Rectum incompletely distended, cannot exclude distal rectal wall
thickening.
Diverticular changes of descending and proximal sigmoid colon
without definite evidence of diverticulitis.
Small bowel loops unremarkable.
Tiny hiatal hernia.
No mass, adenopathy, free fluid or definite inflammatory process.
Newly identified mixed sclerotic and lytic process involving the L2
vertebral body, 2.8 cm diameter, with central deformity of the
inferior endplate.
Metastatic lesion is not excluded.
No other osseous abnormalities identified.
IMPRESSION: No acute intra abdominal abnormalities.
Renal and hepatic cysts.
Tiny hiatal hernia.
Nonobstructing right renal calculus. Diverticulosis of the distal
colon without definite evidence of diverticulitis.
Question destructive lesion of the L2 vertebral body, new since
3998; metastatic lesion not excluded, recommend non emergent follow-
up MR imaging.

## 2012-01-11 ENCOUNTER — Encounter: Payer: Self-pay | Admitting: Family Medicine

## 2012-01-11 ENCOUNTER — Ambulatory Visit (INDEPENDENT_AMBULATORY_CARE_PROVIDER_SITE_OTHER): Payer: Medicare Other | Admitting: Family Medicine

## 2012-01-11 VITALS — BP 140/74 | HR 79 | Resp 18 | Ht 72.0 in | Wt 268.0 lb

## 2012-01-11 DIAGNOSIS — R1011 Right upper quadrant pain: Secondary | ICD-10-CM

## 2012-01-11 DIAGNOSIS — I10 Essential (primary) hypertension: Secondary | ICD-10-CM

## 2012-01-11 DIAGNOSIS — L0293 Carbuncle, unspecified: Secondary | ICD-10-CM

## 2012-01-11 DIAGNOSIS — L0292 Furuncle, unspecified: Secondary | ICD-10-CM

## 2012-01-11 DIAGNOSIS — E119 Type 2 diabetes mellitus without complications: Secondary | ICD-10-CM

## 2012-01-11 DIAGNOSIS — I6350 Cerebral infarction due to unspecified occlusion or stenosis of unspecified cerebral artery: Secondary | ICD-10-CM

## 2012-01-11 MED ORDER — FEBUXOSTAT 40 MG PO TABS: 40.0000 mg | ORAL_TABLET | Freq: Every day | ORAL | Status: DC

## 2012-01-11 MED ORDER — CLONIDINE HCL 0.2 MG PO TABS: 0.2000 mg | ORAL_TABLET | Freq: Three times a day (TID) | ORAL | Status: DC

## 2012-01-11 MED ORDER — SIMVASTATIN 20 MG PO TABS: 20.0000 mg | ORAL_TABLET | Freq: Every day | ORAL | Status: DC

## 2012-01-11 MED ORDER — HYDROCODONE-ACETAMINOPHEN 5-500 MG PO TABS: 1.0000 | ORAL_TABLET | Freq: Two times a day (BID) | ORAL | Status: DC | PRN

## 2012-01-11 NOTE — Patient Instructions (Signed)
Continue your current medications  I will get records from Dr. Dorris Fetch and Dr. Legrand Rams  Use the hydrocodone for your pain as needed, you can take this twice a day  It was nice to meet you !  F/U in 6 weeks

## 2012-01-12 DIAGNOSIS — L0293 Carbuncle, unspecified: Secondary | ICD-10-CM | POA: Insufficient documentation

## 2012-01-12 NOTE — Assessment & Plan Note (Signed)
Currently on antibiotics. Advised warm compresses

## 2012-01-12 NOTE — Assessment & Plan Note (Signed)
Continue current meds, his BP is likely going to be difficult to get below 130/80

## 2012-01-12 NOTE — Assessment & Plan Note (Signed)
Will obtain records from Dr. Dorris Fetch

## 2012-01-12 NOTE — Assessment & Plan Note (Signed)
Recurrent pain with negative work-up, I think this may be neuropathic pain with otherwise negative work-up. Start hydrocodone daily

## 2012-01-12 NOTE — Assessment & Plan Note (Signed)
Remote history of stroke, residual Right hemiparesis.

## 2012-01-12 NOTE — Progress Notes (Signed)
  Subjective:    Patient ID: [name], male    DOB: 12/14/1945, 66 y.o.   MRN: [id number]  HPI  Pt here to establish care, previous PCP Dr. Legrand Rams. Wife here to give history. Medications and history reviewed. GI- Dr. Gala Romney  Chronic abdominal pain- followed by GI, he has history of Barretts esophagus, but extensive work-up has not found a reason for recurrent pain- it is noted pain is on the side effected by stroke. I reviewed last note which recommended pain management.Currently no pain meds.  HTN-bp has been very difficult to control, on 5 meds  DM- followed by Dr. Dorris Fetch, SSI and lantus, cbg continue to fluctuate in 200's  H/o CVA- residual right hemiparesis, on Simvastatin  Recurrent boils- has been seen by dermatology in the past- has current boil on antibiotic but they did not bring this with them today  Abnormal lesion in lumbar spine- followed by Dr.Neijstrom, also has hypercalcemia     Review of Systems   GEN- denies fatigue, fever, weight loss,weakness, recent illness HEENT- denies eye drainage, change in vision, nasal discharge, CVS- denies chest pain, palpitations, +leg pain RESP- denies SOB, cough, wheeze ABD- denies N/V, change in stools, +abd pain GU- denies dysuria, hematuria, dribbling, incontinence MSK- + joint pain, +muscle aches, injury Neuro- denies headache, dizziness, syncope, seizure activity      Objective:   Physical Exam GEN- NAD, alert and oriented x3, sitting in wheelchair, obese HEENT- PERRL, EOMI, non injected sclera, pink conjunctiva, MMM, oropharynx clear, left eye rolls back often, lazy right eye, wears glasses Neck- Supple, no carotid bruit CVS- RRR, no murmur RESP-CTAB ABD- NABS, soft, mild TTP over RUQ and epigastric, ND EXT- 1+ edema Pulses- Radial, DP- 2+ Skin- small 0.5cm boil on back, multiple well healing lesions on back Neuro- right hemiparesis, unable to use RUE         Assessment & Plan:

## 2012-01-23 ENCOUNTER — Encounter: Payer: Self-pay | Admitting: Family Medicine

## 2012-01-25 LAB — LIPID PANEL
Cholesterol: 132 mg/dL (ref 0–200)
HDL: 47 mg/dL (ref 35–70)
Triglycerides: 136 mg/dL (ref 40–160)

## 2012-01-25 LAB — BASIC METABOLIC PANEL: BUN: 34 mg/dL — AB (ref 4–21)

## 2012-02-02 ENCOUNTER — Encounter: Payer: Self-pay | Admitting: Family Medicine

## 2012-02-13 ENCOUNTER — Ambulatory Visit (INDEPENDENT_AMBULATORY_CARE_PROVIDER_SITE_OTHER): Payer: Medicare Other | Admitting: Family Medicine

## 2012-02-13 ENCOUNTER — Encounter: Payer: Self-pay | Admitting: Family Medicine

## 2012-02-13 VITALS — BP 120/80 | HR 83 | Resp 18

## 2012-02-13 DIAGNOSIS — E1149 Type 2 diabetes mellitus with other diabetic neurological complication: Secondary | ICD-10-CM

## 2012-02-13 DIAGNOSIS — L0292 Furuncle, unspecified: Secondary | ICD-10-CM

## 2012-02-13 DIAGNOSIS — L0293 Carbuncle, unspecified: Secondary | ICD-10-CM

## 2012-02-13 DIAGNOSIS — E1142 Type 2 diabetes mellitus with diabetic polyneuropathy: Secondary | ICD-10-CM

## 2012-02-13 DIAGNOSIS — E119 Type 2 diabetes mellitus without complications: Secondary | ICD-10-CM

## 2012-02-13 DIAGNOSIS — R1011 Right upper quadrant pain: Secondary | ICD-10-CM

## 2012-02-13 DIAGNOSIS — E1140 Type 2 diabetes mellitus with diabetic neuropathy, unspecified: Secondary | ICD-10-CM | POA: Insufficient documentation

## 2012-02-13 DIAGNOSIS — I10 Essential (primary) hypertension: Secondary | ICD-10-CM

## 2012-02-13 MED ORDER — HYDROCODONE-ACETAMINOPHEN 7.5-500 MG PO TABS: 1.0000 | ORAL_TABLET | Freq: Two times a day (BID) | ORAL | Status: DC | PRN

## 2012-02-13 MED ORDER — GABAPENTIN 300 MG PO CAPS: 300.0000 mg | ORAL_CAPSULE | Freq: Three times a day (TID) | ORAL | Status: DC

## 2012-02-13 NOTE — Progress Notes (Signed)
  Subjective:    Patient ID: [name], male    DOB: 1946-03-04, 66 y.o.   MRN: [id number]  HPI Patient here to follow plastic visit Abdominal pain- unchanged with addition of hydrocodone. He does not think it is strong enough.  Boil-continues to have pain over the same boil on his back. He completed antibiotics. His wife has not noticed any drainage from the lesion but it is quite tender to lay on.  Burning in feet- he has had burning in feet for some time as well as tingling. His feet are very sensitive to touch. He's not been tried on any neuropathic medications. He has a podiatrist  Diabetes mellitus- followed by endocrine. Reviewed last note.   Review of Systems    GEN- denies fatigue, fever, weight loss,weakness, recent illness HEENT- denies eye drainage, +change in vision, nasal discharge, CVS- denies chest pain, palpitations, +leg pain RESP- denies SOB, cough, wheeze ABD- denies N/V, change in stools, +abd pain GU- denies dysuria, hematuria, dribbling, incontinence MSK- + joint pain, +muscle aches, injury Neuro- denies headache, dizziness, syncope, seizure activity     Objective:   Physical Exam GEN- NAD, alert and oriented x3, sitting in wheelchair, obese HEENT- PERRL, EOMI, non injected sclera, pink conjunctiva, MMM, oropharynx clear, left eye rolls back often, lazy right eye, wears glasses CVS- RRR, no murmur RESP-CTAB ABD- NABS, soft, mild TTP over RUQ and epigastric, ND EXT- 1+ edema See diabetic foot exam Pulses- Radial, DP- 2+ Skin- chronic small 0.5cm boil on back- no drainage, +induration, multiple well healing lesions on back, keloid like lesion 3cm below right axilla TTP, no erythema beneath axilla, small skin tags beneath left axilla Neuro- right hemiparesis,         Assessment & Plan:

## 2012-02-13 NOTE — Assessment & Plan Note (Signed)
Patient has chronic nonhealing area that continues to be reinfected. I will send him to general surgeon to have this removed.

## 2012-02-13 NOTE — Assessment & Plan Note (Signed)
He has hypersensitivity of his feet I think this is an explain his diabetes which is not well-controlled as well as status post his CVA. We'll start gabapentin at night and titrate upward

## 2012-02-13 NOTE — Patient Instructions (Signed)
I will refer you to Dr.Bradford to have the boils removed  Start the nerve medication at bedtime- Neurontin I have increased the pain medication to 7.60m twice a day as needed  I will send you to a eye doctor  Continue your other medication  F/U in 2 months

## 2012-02-13 NOTE — Assessment & Plan Note (Signed)
Increase hydrocodone 7.5 mg. We'll start gabapentin for neuropathy

## 2012-02-13 NOTE — Assessment & Plan Note (Signed)
Reviewed last note by endocrinologist. His A1c has improved to 9.1%

## 2012-02-13 NOTE — Assessment & Plan Note (Signed)
Continue current medications blood pressure looks good today

## 2012-02-19 IMAGING — CR DG RIBS 2V*R*
4 series · 4 of 4 positions shown · non-contrast
Comparison: The medicine skeletal imaging study of  07/05/2011.

CLINICAL DATA: History of abnormal activity within multiple right
ribs on nuclear medicine skeletal imaging study.

RIGHT RIBS - 2 VIEW

[view not recorded (1 of 4)]
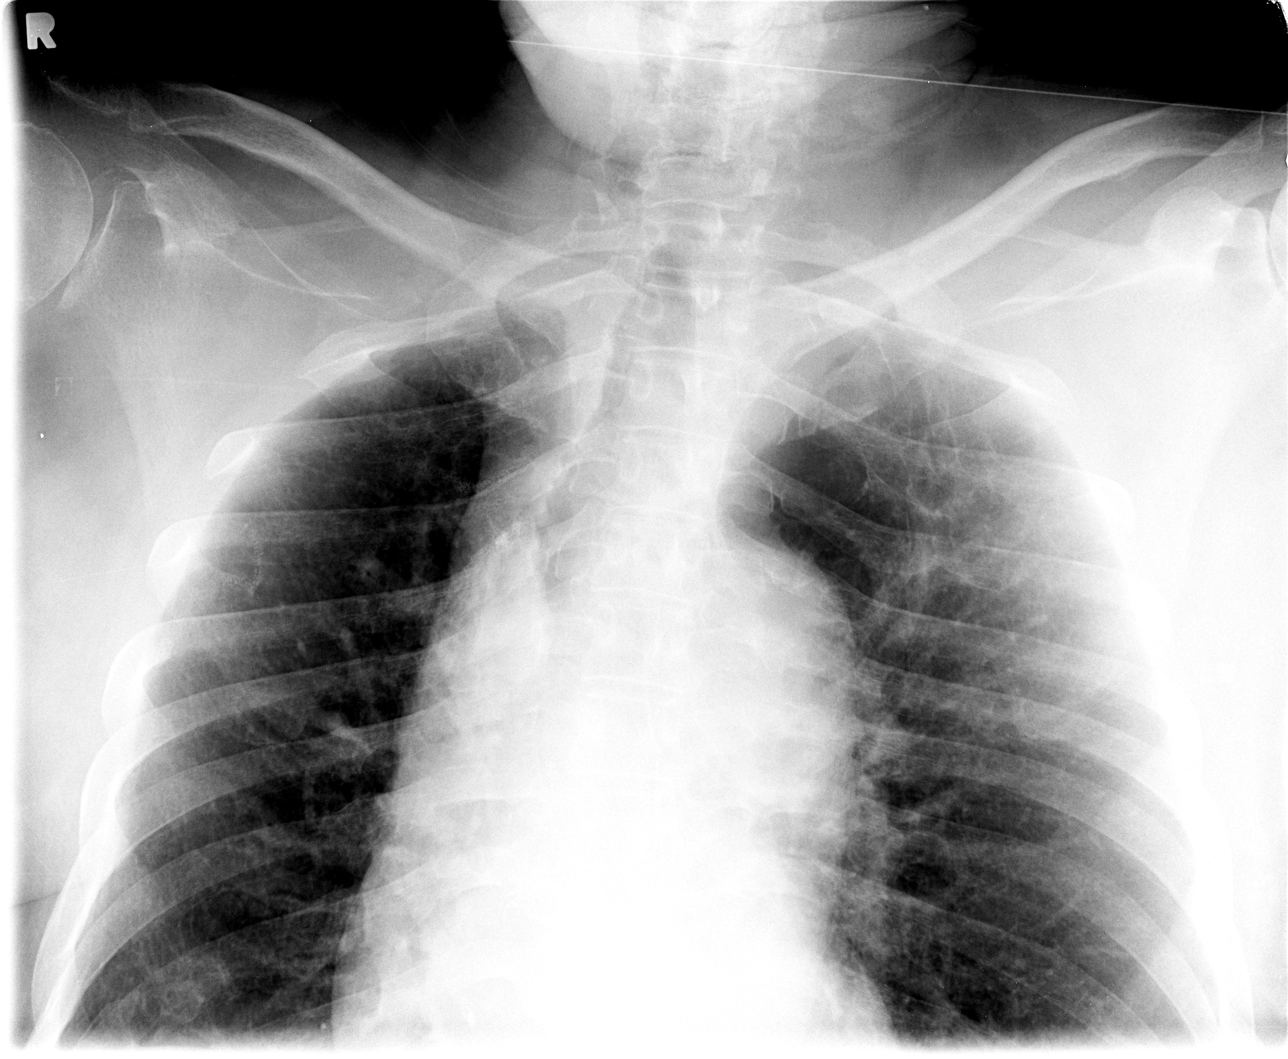

[view not recorded (2 of 4)]
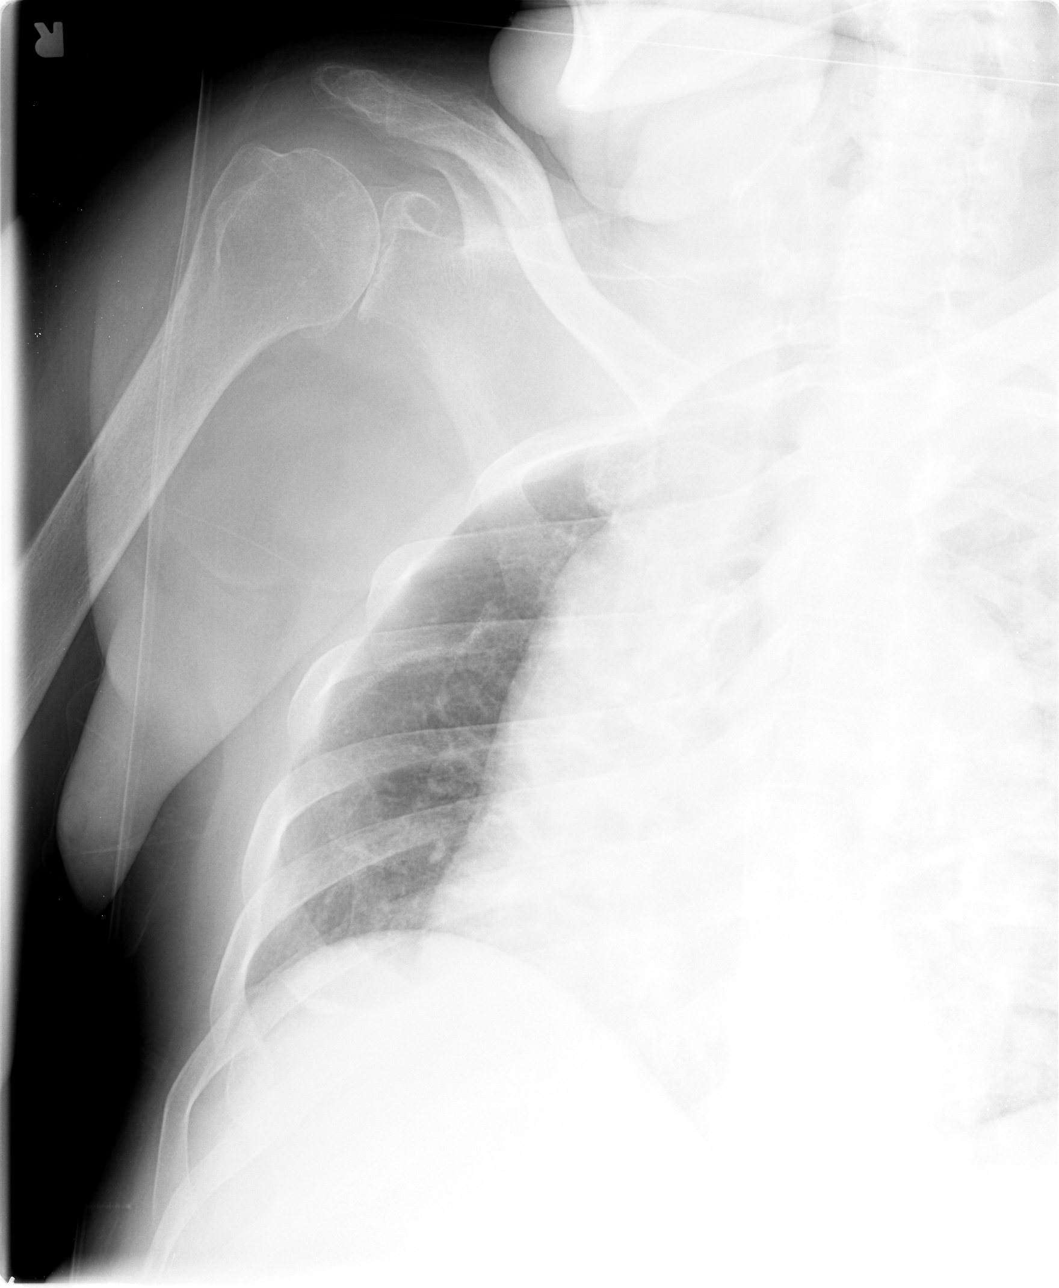

[view not recorded (3 of 4)]
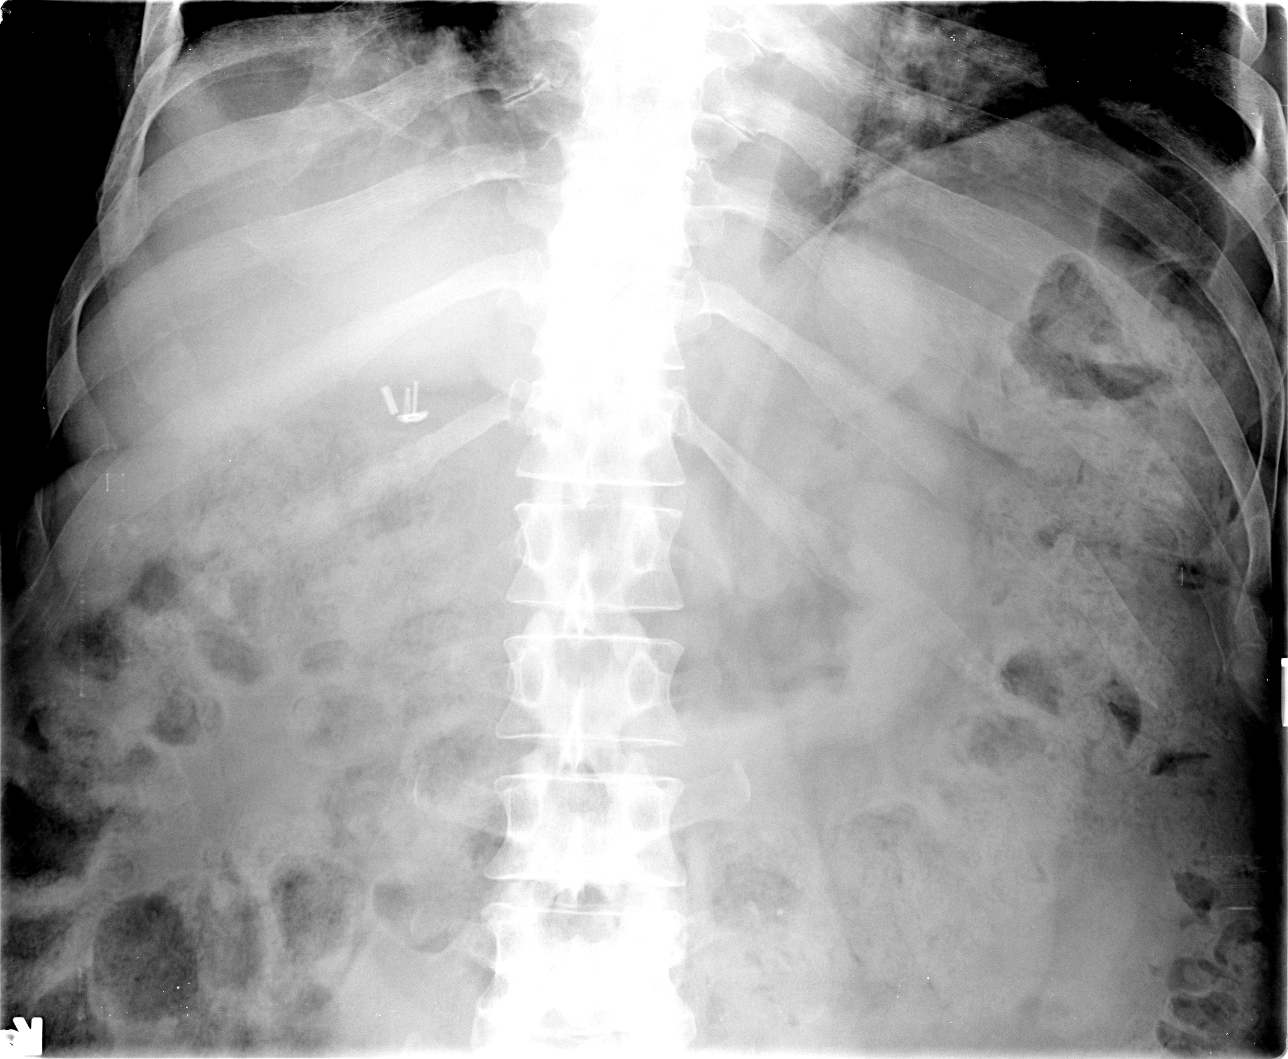

[view not recorded (4 of 4)]
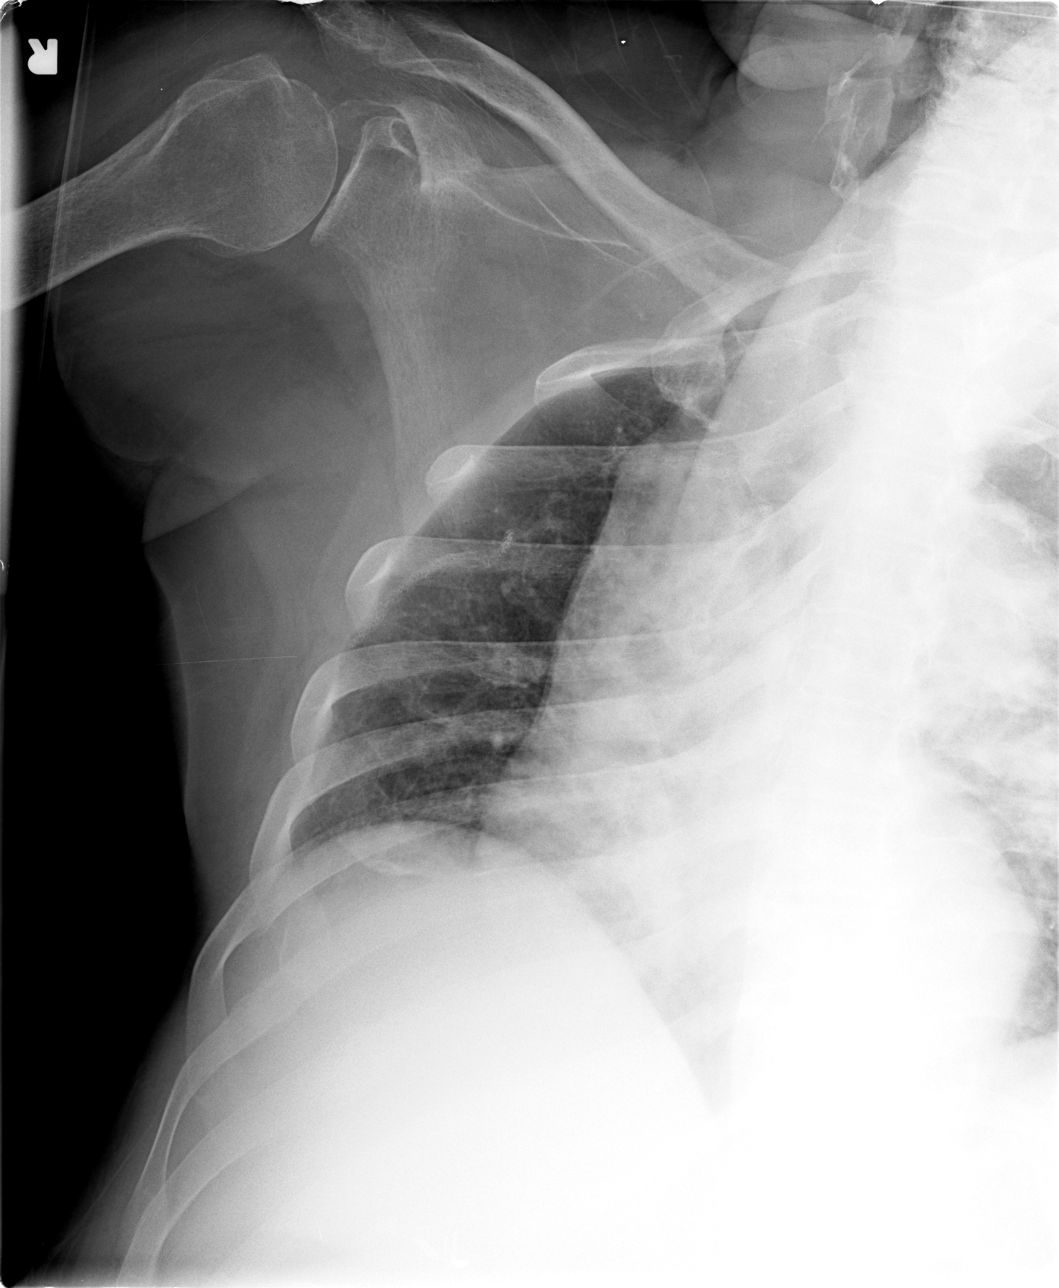

[4 of 4 positions shown; findings below may reference images not displayed]

FINDINGS: On  the nuclear medicine skeletal imaging study abnormal
activity was seen in the posterior aspect of multiple right ribs in
adjacent locations.  There is involvement probably of the posterior
aspects of the the right fifth, sixth, seventh, and eighth ribs.

The patient's body habitus limits detail. There are subtle
deformities of the posterolateral aspects of several lower right
ribs. These are in a similar location to that seen on the nuclear
medicine study.  I feel these are consistent with healing rib
fractures and abnormal activity most likely reflects remodeling
activity as healing rib fractures progressed. No rib destruction or
pneumothorax is evident.
IMPRESSION: Activity on the medicine study is felt to represent several
adjacent minimal healing rib fractures.

## 2012-02-19 IMAGING — CR DG LUMBAR SPINE 2-3V
3 series · 3 of 3 positions shown · non-contrast
Comparison: Nuclear medicine skeletal imaging studies 07/05/2011.
CT of 05/08/2011.  MRI dated 05/17/2011.

CLINICAL DATA: Abnormal activity on nuclear medicine skeletal
imaging study of same date.

LUMBAR SPINE - 2-3 VIEW

[view not recorded (1 of 3)]
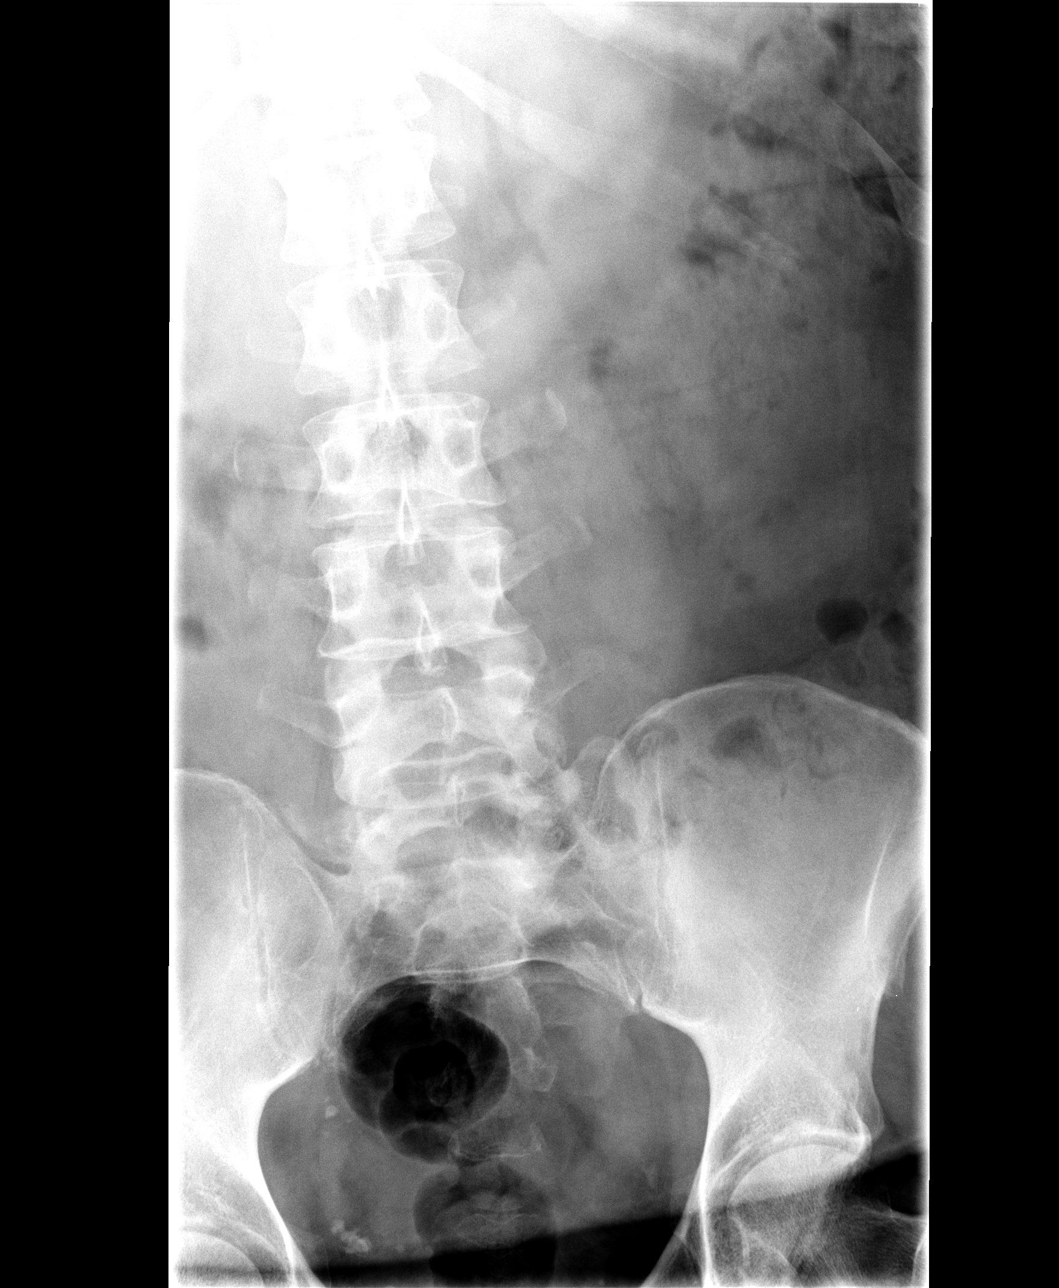

[view not recorded (2 of 3)]
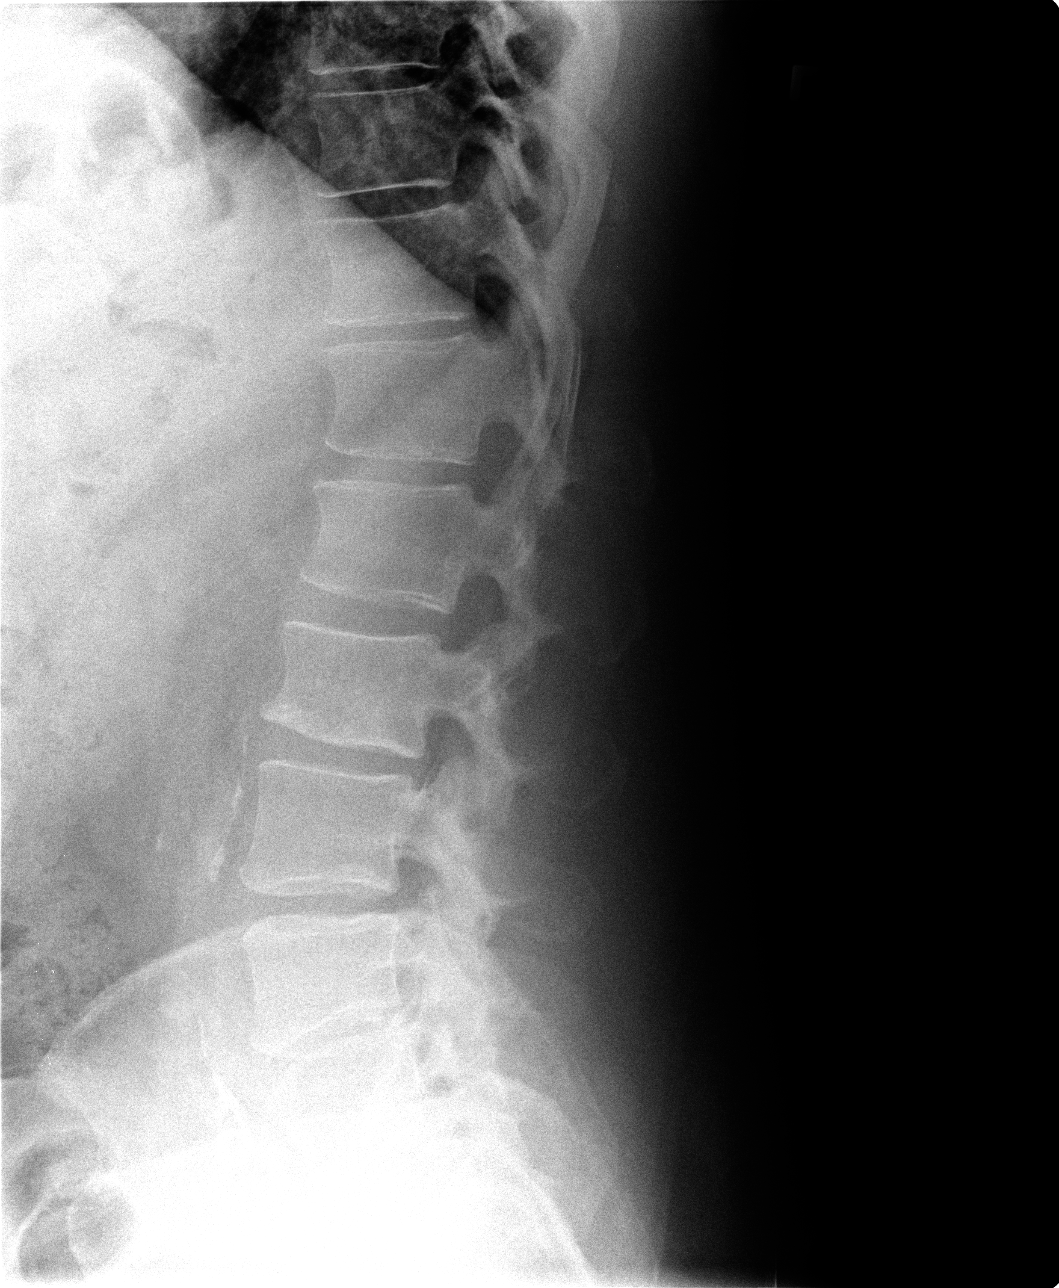

[view not recorded (3 of 3)]
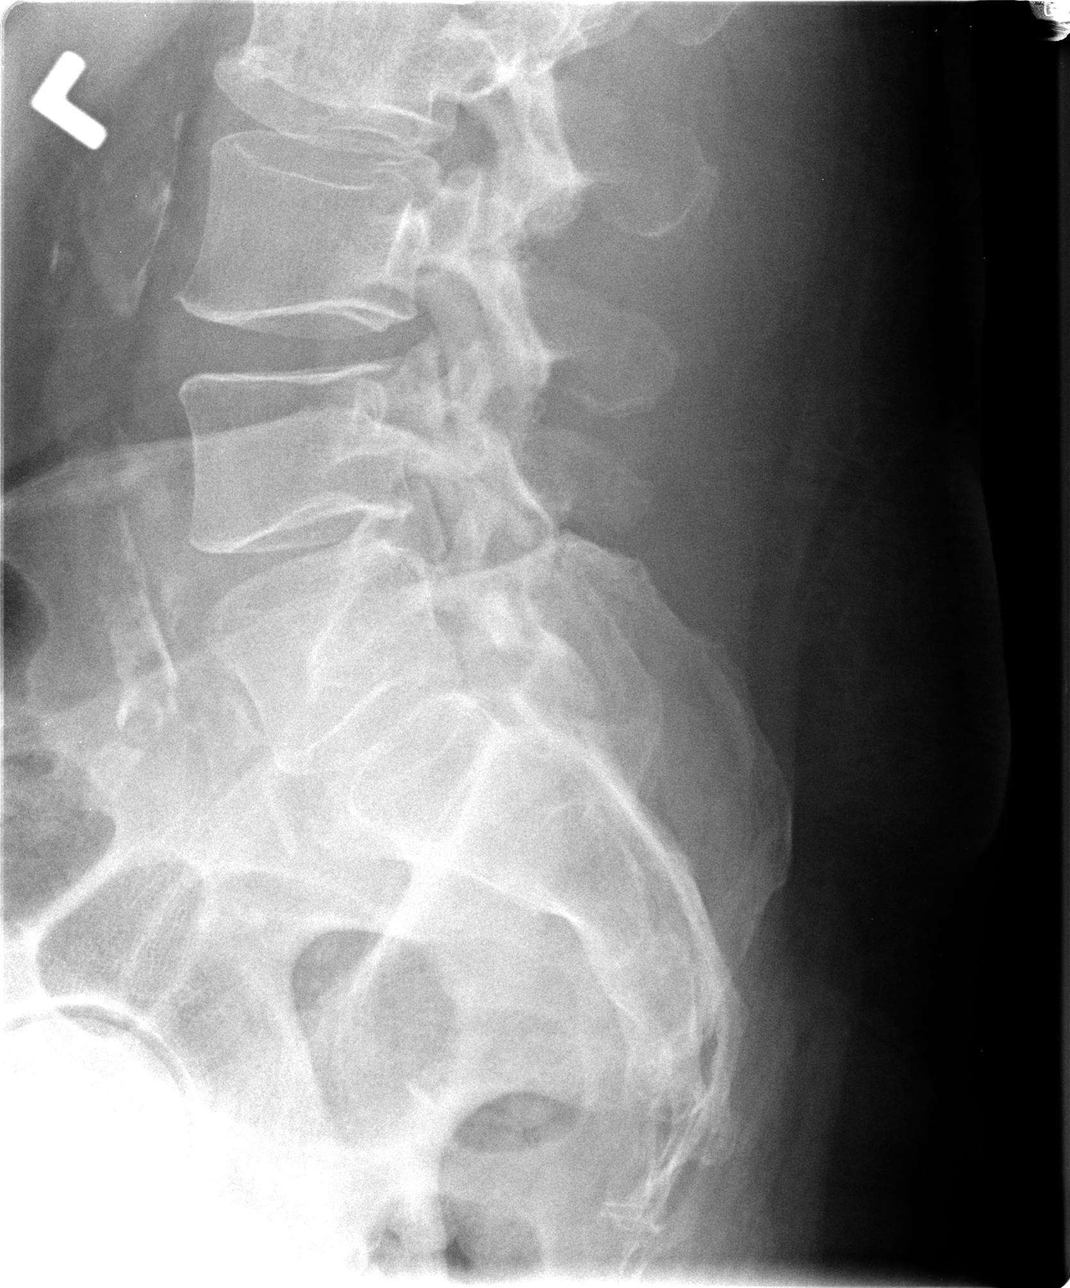

[3 of 3 positions shown; findings below may reference images not displayed]

FINDINGS: On the nuclear medicine study abnormal activity was
identified in the area of the L2 vertebral body.  On the previous
CT there was mixed sclerotic and lytic appearance of L2 vertebral
body with deformity of the inferior endplate.  On MR examination
there was abnormal marrow signal and L2 vertebral body with
inferior endplate compression fracture.  There is bone marrow edema
radiating to the superior endplate of L2.  There was compression
fracture with 25% loss of height.  Deformity of the inferior
endplate suggesting Schmorl's node.

On the current examination the minimal anterior loss of height of
the L2 vertebral body is seen.  There is some is sclerotic or
blastic increased density in the inferior portion of the L2
vertebral body but no definite lytic destruction can be appreciated
on the plain imaging.  Nonaneurysmal aortic iliac calcifications
are present.
IMPRESSION: Abnormal appearance of the L2 vertebral body on the previous CT and
MRI.On the current examination the minimal anterior loss of height
of the L2 vertebral body compression fracture is seen.  There is
some sclerotic or blastic increased density in the inferior portion
of the L2 vertebral body where the previous Schmorl's node and
inferior endplate compression fracture was present. However, no
definite lytic destruction can be appreciated on the plain imaging.

## 2012-02-19 IMAGING — CR DG TIBIA/FIBULA 2V*L*
4 series · 4 of 4 positions shown · non-contrast
Comparison: Bone scan dated 07/05/2011

CLINICAL DATA: Abnormal activity on bone scan dated 07/05/2011.

LEFT TIBIA AND FIBULA - 2 VIEW

[view not recorded (1 of 4)]
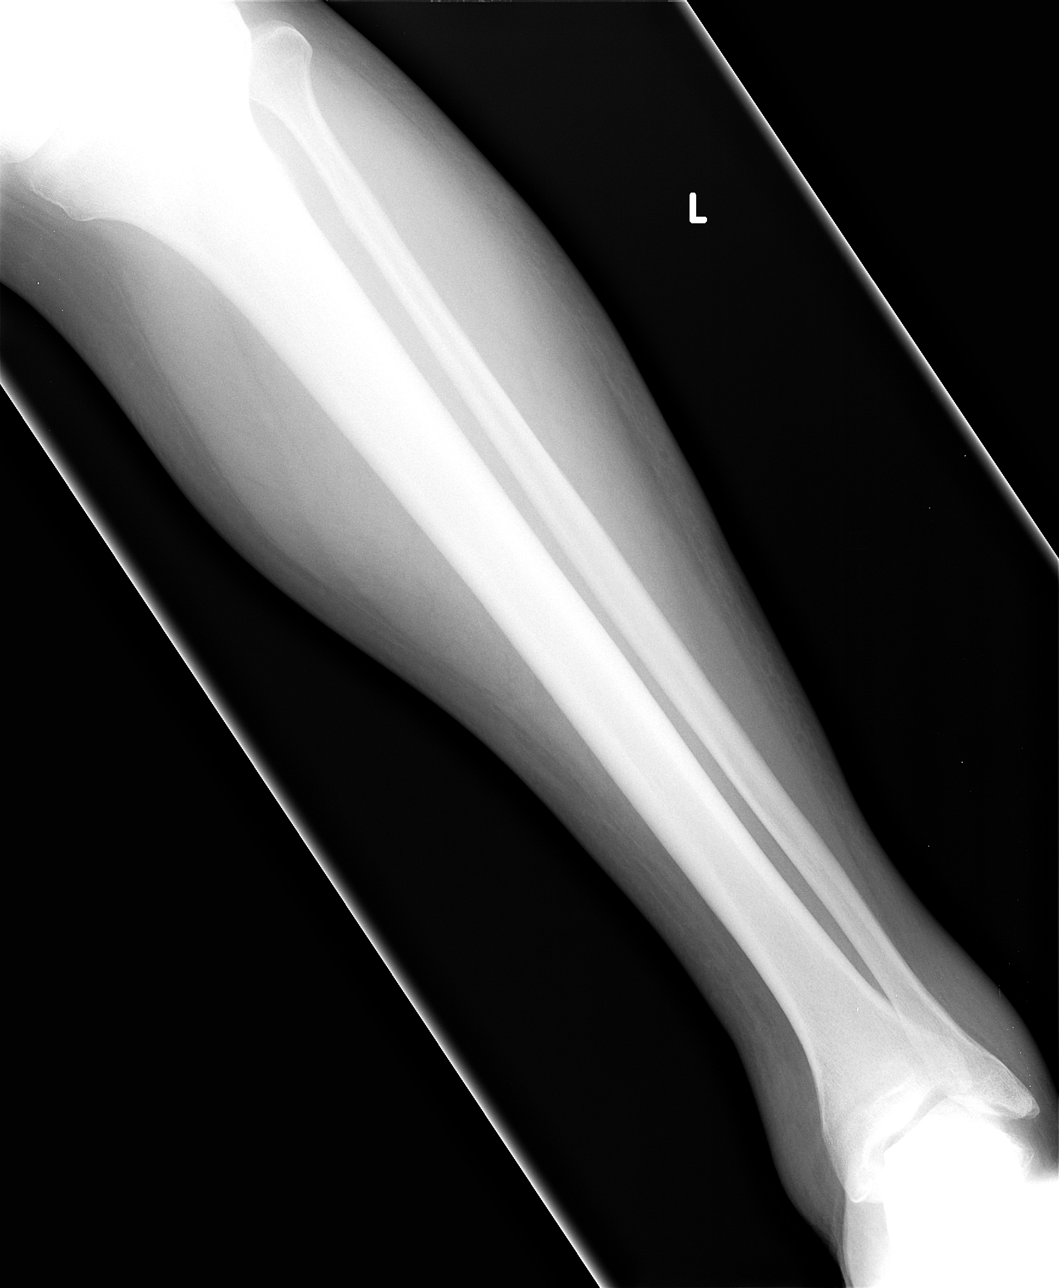

[view not recorded (2 of 4)]
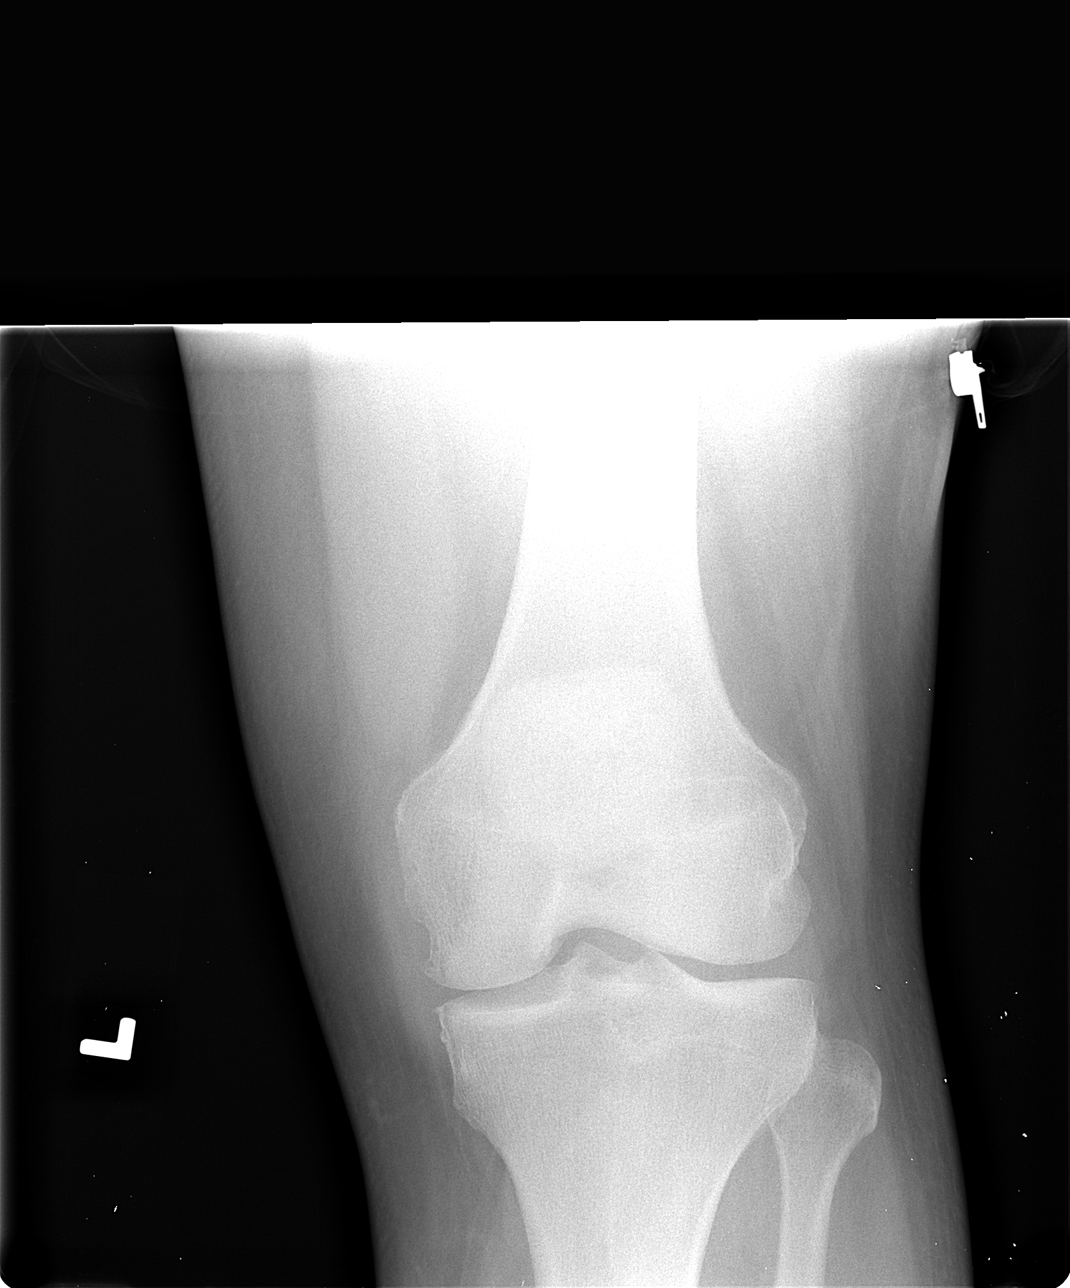

[view not recorded (3 of 4)]
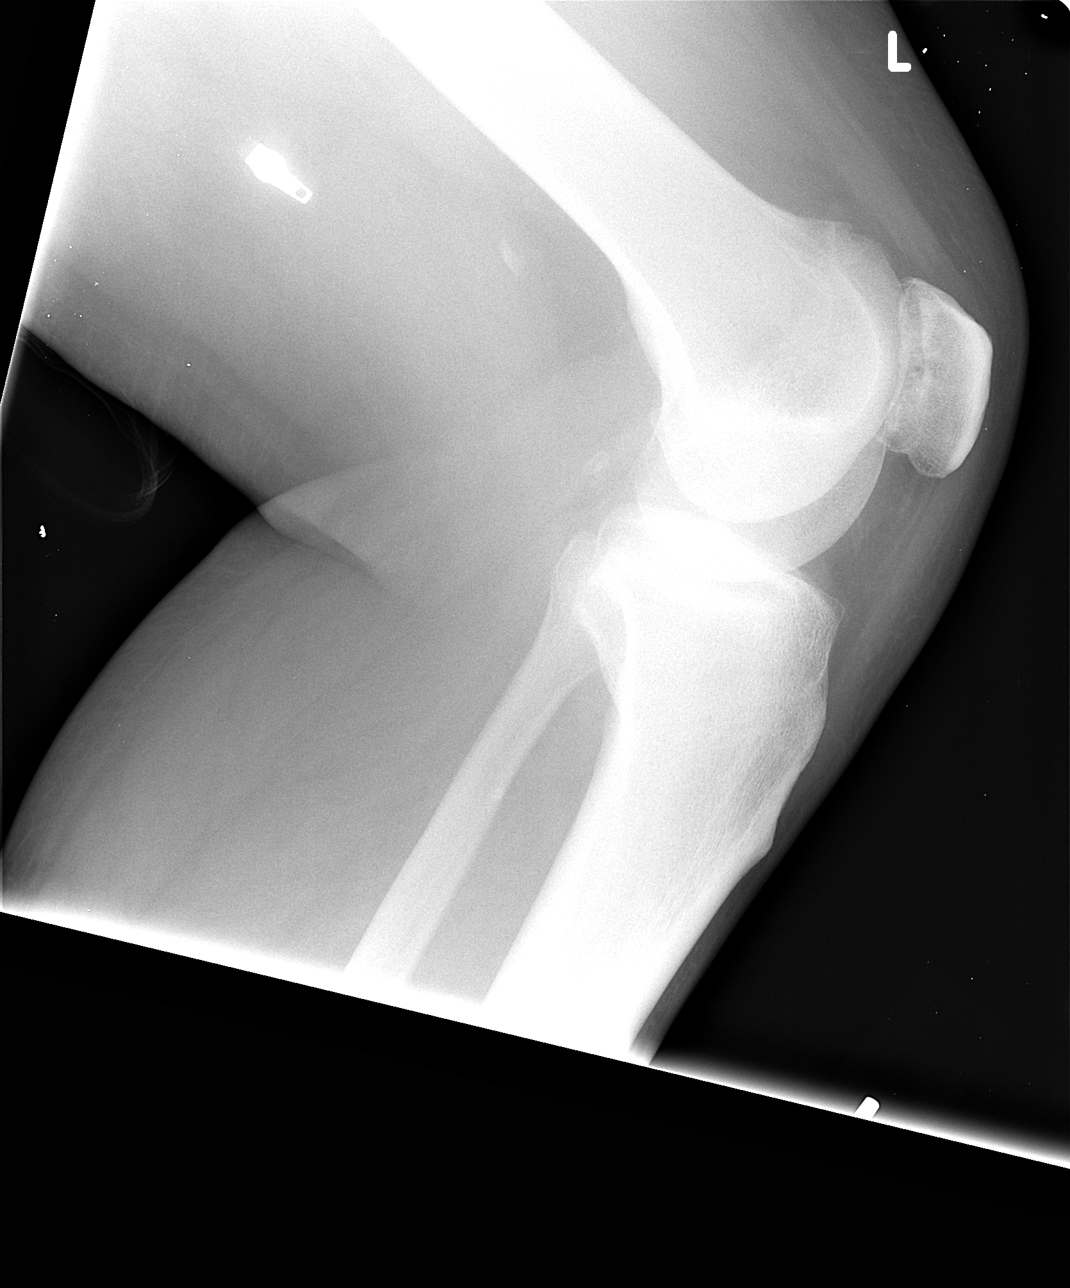

[view not recorded (4 of 4)]
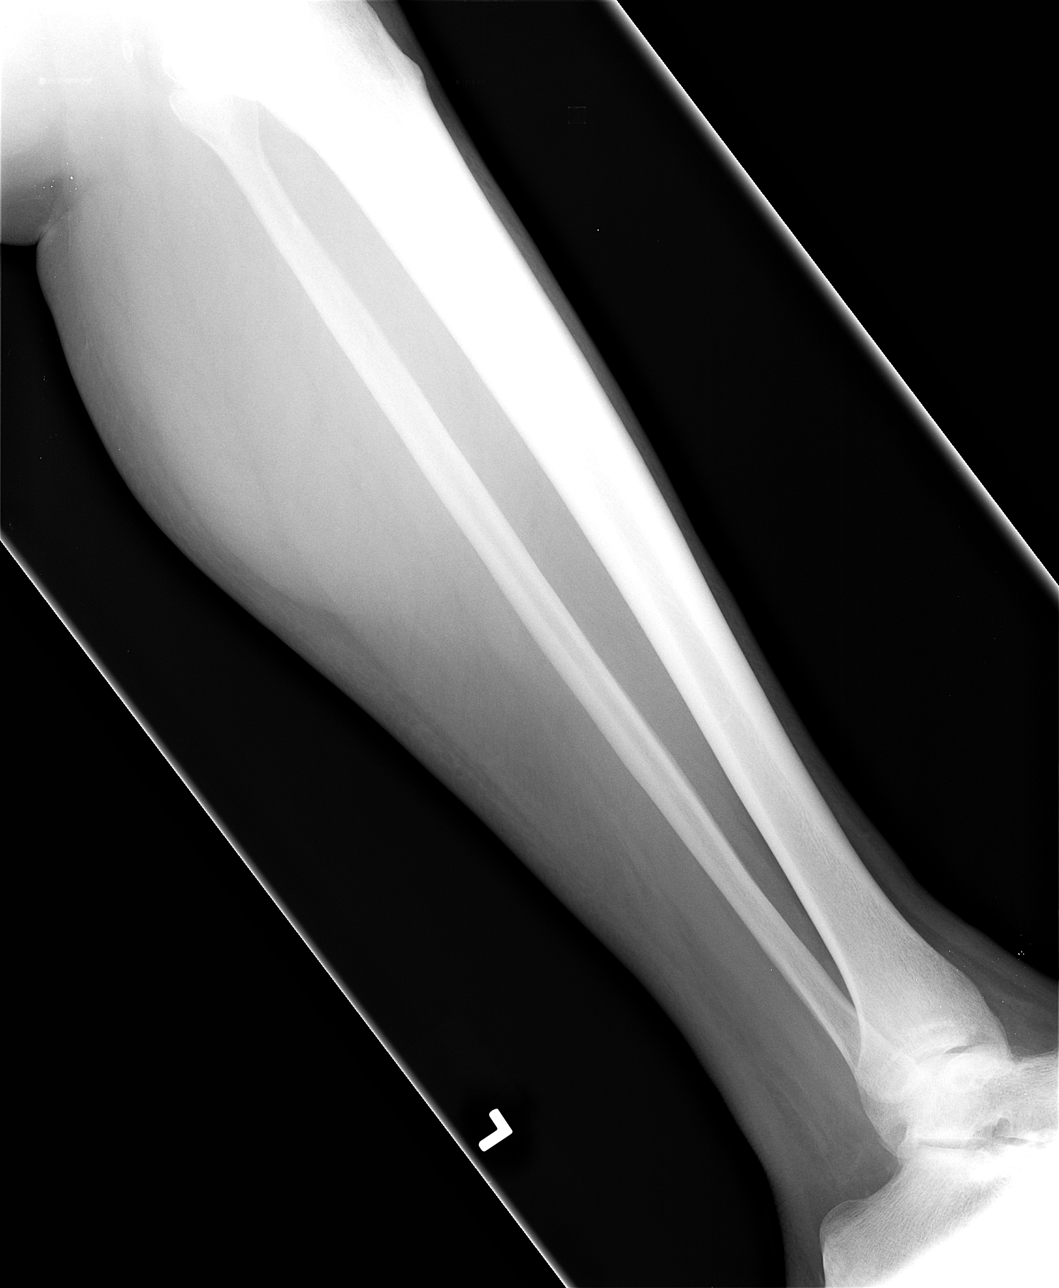

[4 of 4 positions shown; findings below may reference images not displayed]

FINDINGS: There is no significant abnormality of the tibia or
fibula.  The activity on bone scan is felt to have represented
urine contamination.

Note is made of osteoarthritic changes of the patella with slight
marginal spur formation in the medial compartment of the knee.
IMPRESSION: No significant abnormality of the tibia or fibula.  Activity on
bone scan is felt to have represented urine contamination.

Osteoarthritic changes of the left knee as described.

## 2012-02-21 ENCOUNTER — Other Ambulatory Visit (HOSPITAL_COMMUNITY)
Admission: RE | Admit: 2012-02-21 | Discharge: 2012-02-21 | Disposition: A | Discharge status: Home / Self Care | Payer: Medicare Other | Source: Ambulatory Visit | Attending: General Surgery | Admitting: General Surgery

## 2012-02-21 DIAGNOSIS — L723 Sebaceous cyst: Secondary | ICD-10-CM | POA: Insufficient documentation

## 2012-02-22 ENCOUNTER — Ambulatory Visit: Payer: Medicare Other | Admitting: Family Medicine

## 2012-02-22 NOTE — Letter (Signed)
February 21, 2012  ATTN:                     Vic Blackbird, MD [address] [place], [place].  OM:[facility] # [id number] Date of Admission:  10/22/2011 Date of Discharge:  10/22/2011   Dear Dr. Buelah Manis:  I saw [name] in my office on February 21, 2012, at which time, I excised what appeared to be an ulcerated lesion in his mid back area.  I was unsure whether this could possibly be a squamous cell carcinoma.  At any rate, a wide excision was carried out under local anesthesia without complications.  I will make sure you get a copy of the pathology report for your records.  He also had an area of cellulitis under his right axilla, which I treated with doxycycline.  I have made followup arrangements to see him in a couple weeks' time and told return to you for any medical problems that he may have in the future.     [name], M.D.     WB/MEDQ  D:  02/21/2012  T:  02/22/2012  Job:  [id number]  cc:   Vic Blackbird, MD

## 2012-02-27 IMAGING — XA IR FLUORO GUIDE NDL PLMT / BX
1 series · 6 of 6 positions shown · non-contrast
Comparison: MRI scan of the lumbosacral spine of a few days ago.

CLINICAL DATA: History of lymphoma.  Abnormal MRI scan at L2.

 NEEDLE PLACEMENT FOR DEEP CORE BONE BIOPSY AT L2 UNDER FLUOROSCOPY

[Series 300: ir fluoro guide ndl plmt / bx · 6 of 6 slices shown]
[im 1/6]
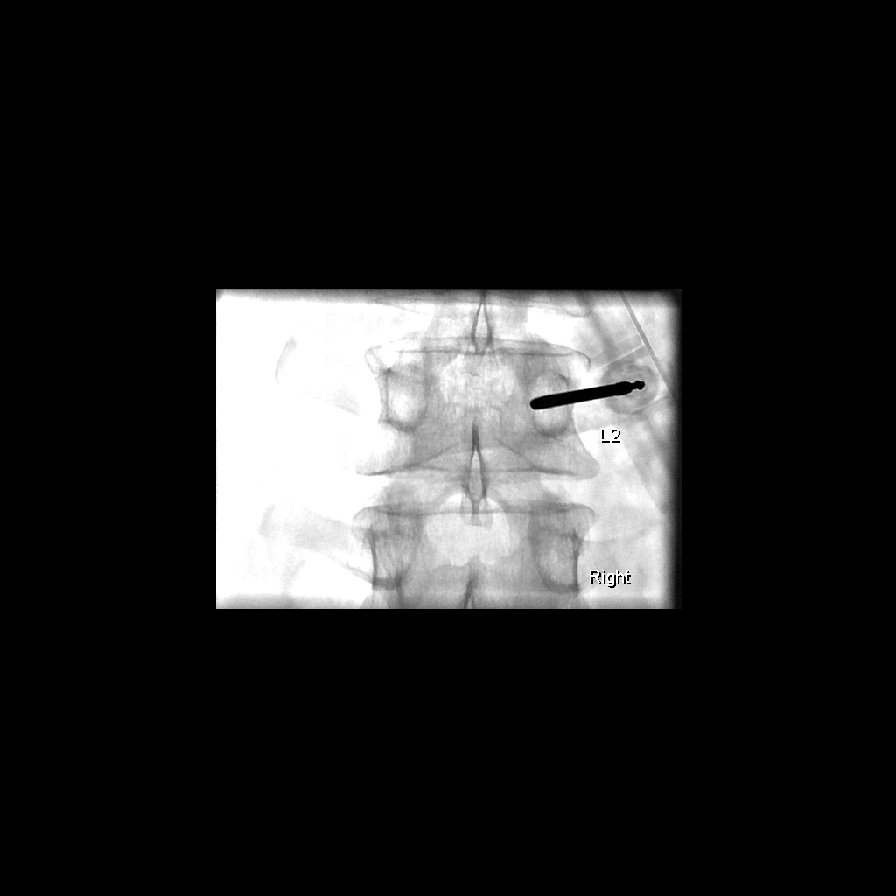
[im 2/6]
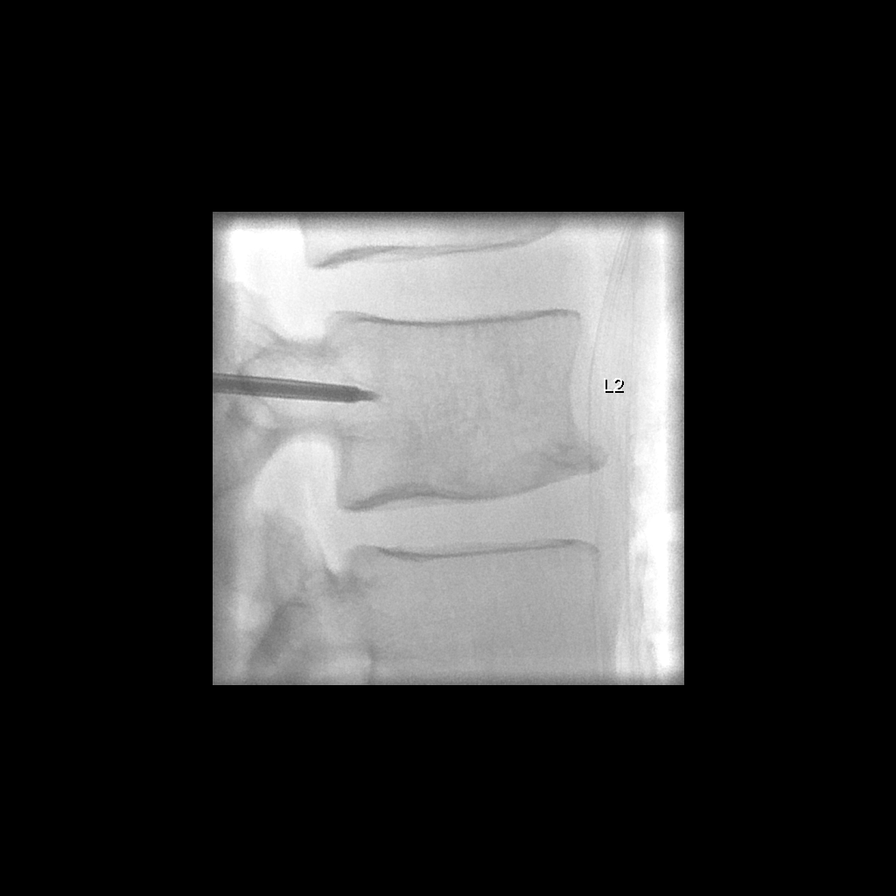
[im 3/6]
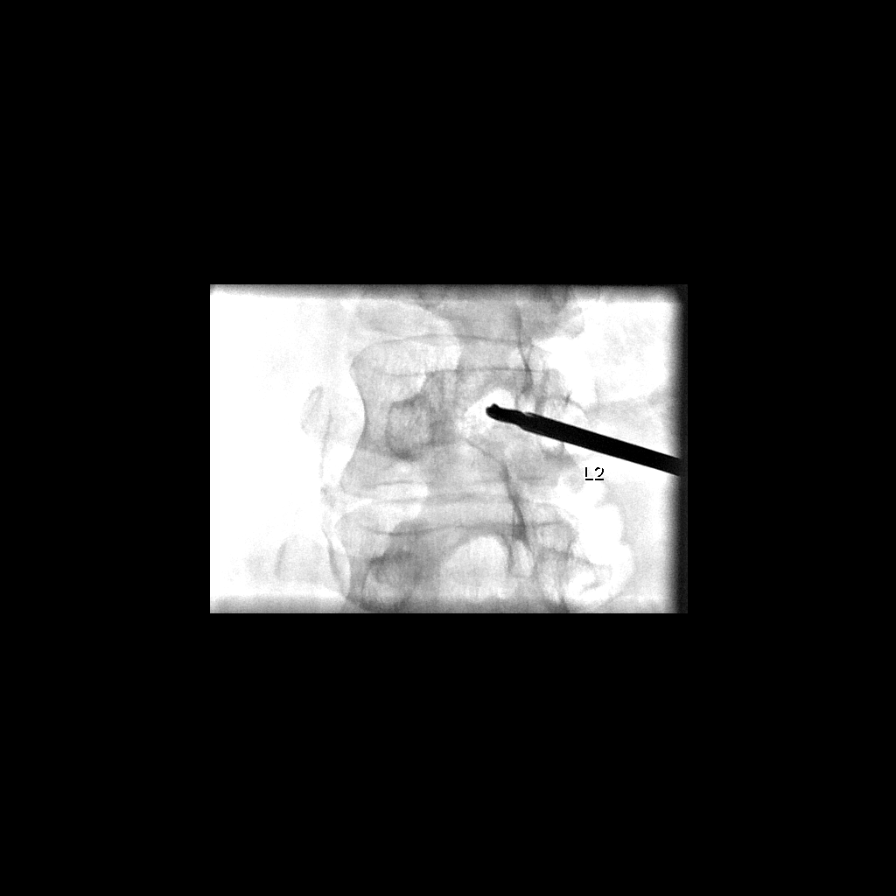
[im 4/6]
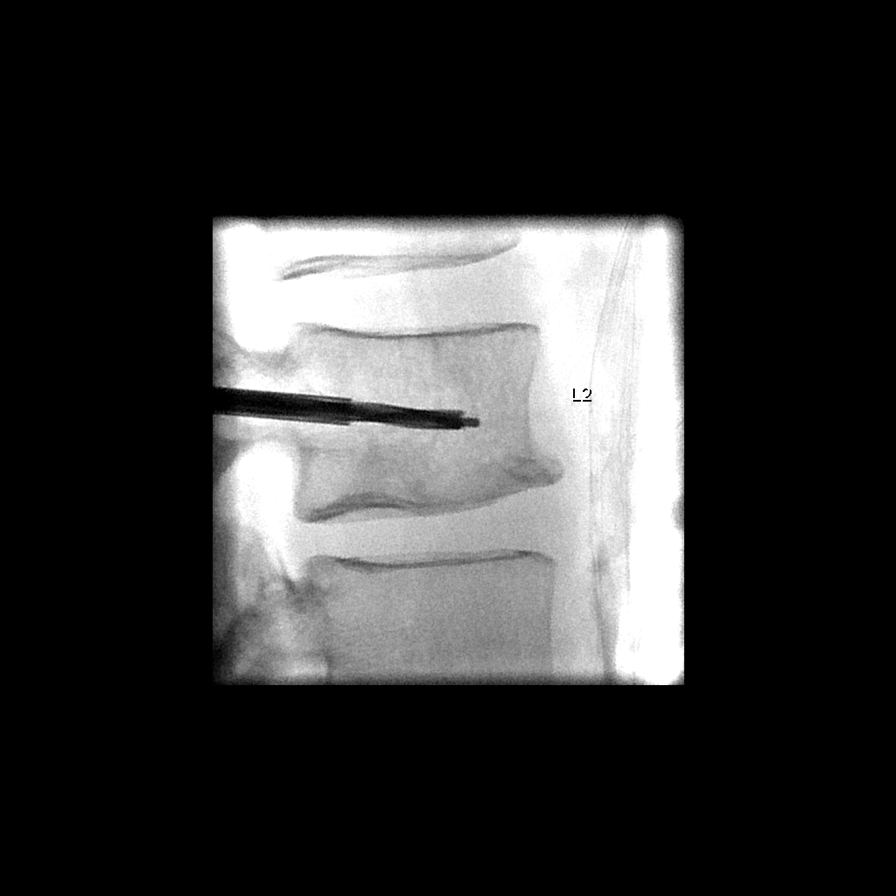
[im 5/6]
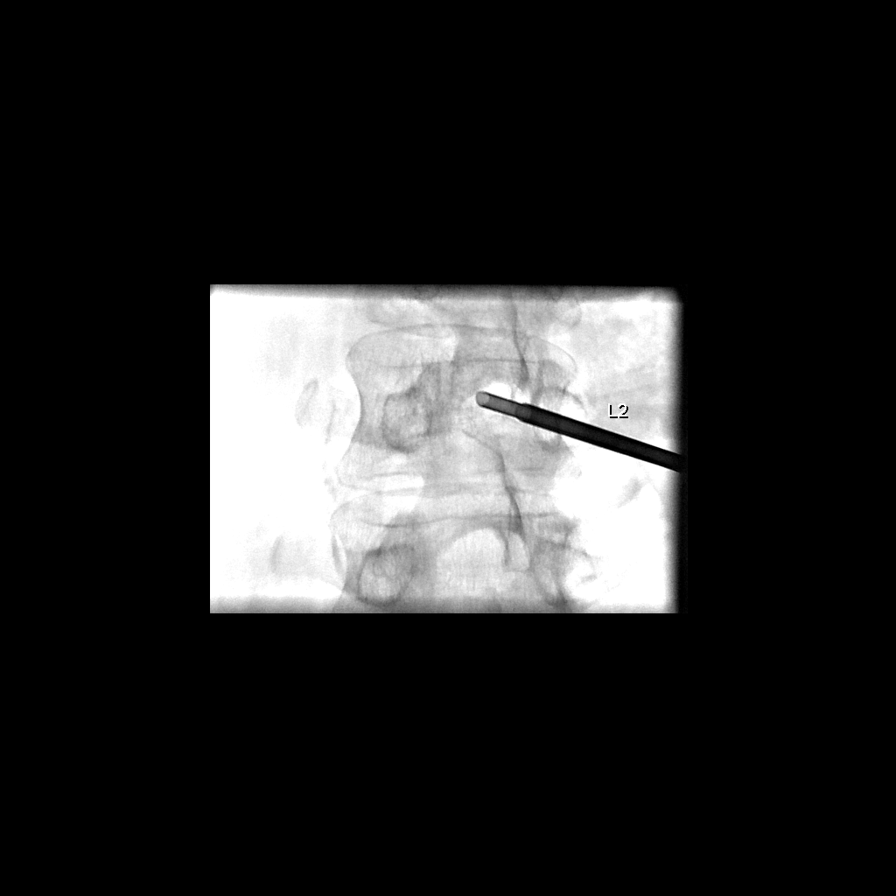
[im 6/6]
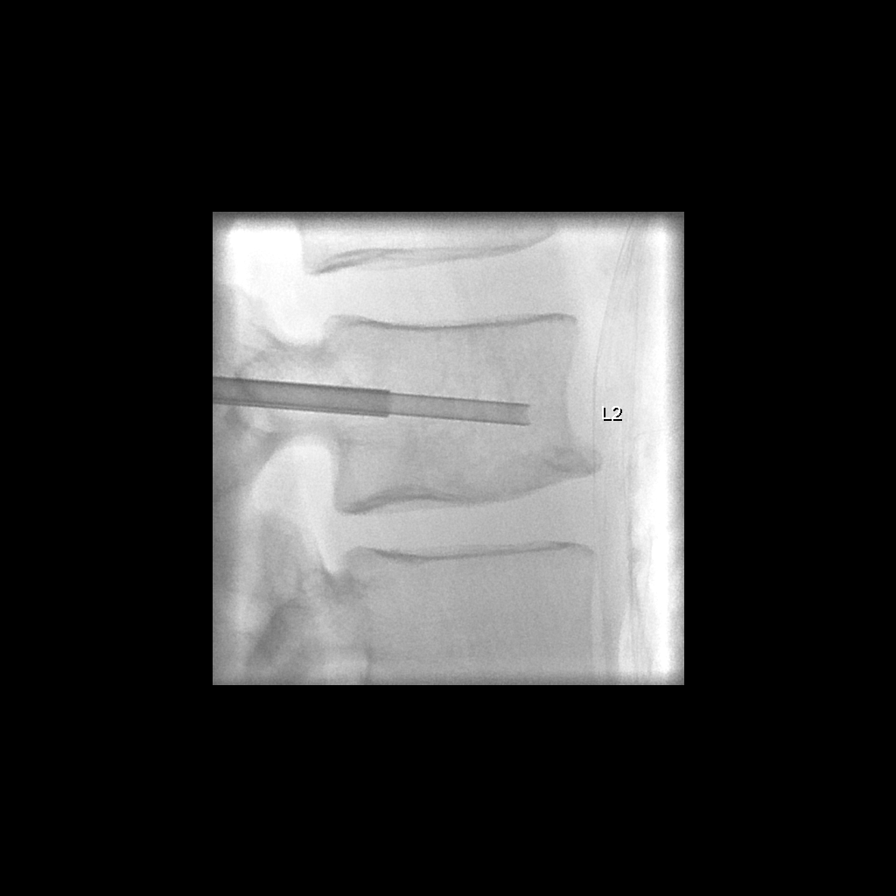

[6 of 6 positions shown; findings below may reference images not displayed]

Following a full explanation of the procedure along with the
potential associated complications, an informed witnessed consent
was obtained.

The patient was placed prone on the fluoroscopic table.  The skin
overlying the thoracolumbar region was then prepped and draped in
the usual sterile fashion.

The right pedicle at L2 was then identified and infiltrated with
0.25% bupivacaine.

Using biplane intermittent fluoroscopy, an 11-gauge Jamshidi needle
was advanced through the right pedicle into the posterior third at
L2.  This was then exchanged out for the Kyphon advanced osteo
introducer system comprised of a working cannula and a Kyphon osteo
drill over a Kyphon osteo bone pin.

The combination was advanced until the tip of the Kyphon osteo
drill was in the posterior third.  The bone pin was removed.

In a medial trajectory the combination was advanced until the
working cannula was in the posterior third at L2.

The Kyphon osteo drill was removed and a core sample sent for
pathologic analysis.

Through the working cannula, two more passes were then made with
the Kyphon bone biopsy device. Using a 20 ml syringe, samples were
obtained and sent for pathologic analysis.

The working cannula was removed.  Hemostasis was achieved at the
skin entry site.

The patient tolerated the procedure well.  There were no acute
complications.

Medications utilized: Versed 3 mg IV.  Fentanyl 75 mcg IV.

IMPRESSION
1.  Status post fluoroscopic-guided needle placement for deep core
bone biopsy at L2 for suspected metastatic involvement.

## 2012-03-13 ENCOUNTER — Telehealth: Payer: Self-pay | Admitting: Internal Medicine

## 2012-03-13 NOTE — Telephone Encounter (Signed)
April 2013 recall list has that patient needs to recheck B12 in 6 months

## 2012-03-14 NOTE — Telephone Encounter (Signed)
Letter and lab order already mailed to pt.

## 2012-04-17 ENCOUNTER — Encounter: Payer: Self-pay | Admitting: Family Medicine

## 2012-04-17 ENCOUNTER — Ambulatory Visit (INDEPENDENT_AMBULATORY_CARE_PROVIDER_SITE_OTHER): Payer: Medicare Other | Admitting: Family Medicine

## 2012-04-17 VITALS — BP 116/74 | HR 77 | Resp 18 | Ht 72.0 in

## 2012-04-17 DIAGNOSIS — R197 Diarrhea, unspecified: Secondary | ICD-10-CM | POA: Insufficient documentation

## 2012-04-17 DIAGNOSIS — E1142 Type 2 diabetes mellitus with diabetic polyneuropathy: Secondary | ICD-10-CM

## 2012-04-17 DIAGNOSIS — E1149 Type 2 diabetes mellitus with other diabetic neurological complication: Secondary | ICD-10-CM

## 2012-04-17 DIAGNOSIS — I6350 Cerebral infarction due to unspecified occlusion or stenosis of unspecified cerebral artery: Secondary | ICD-10-CM

## 2012-04-17 DIAGNOSIS — E1140 Type 2 diabetes mellitus with diabetic neuropathy, unspecified: Secondary | ICD-10-CM

## 2012-04-17 DIAGNOSIS — E669 Obesity, unspecified: Secondary | ICD-10-CM

## 2012-04-17 DIAGNOSIS — I10 Essential (primary) hypertension: Secondary | ICD-10-CM

## 2012-04-17 DIAGNOSIS — E119 Type 2 diabetes mellitus without complications: Secondary | ICD-10-CM

## 2012-04-17 MED ORDER — GABAPENTIN 300 MG PO CAPS: ORAL_CAPSULE | ORAL | Status: DC

## 2012-04-17 NOTE — Progress Notes (Signed)
  Subjective:    Patient ID: [name], male    DOB: November 18, 1946, 66 y.o.   MRN: [id number]  HPI Patient here to followup chronic medical problems. Medications reviewed  Patient seen by surgery status post removal of epidermoid cyst off of back. He has not had any pain in the back area where this was located since. Diabetes mellitus his blood sugars have been running fasting low 100s. He denies any hypoglycemia. Denies polyuria. He is being followed by endocrinology. His wife is concerned that this may have been too low Loose stools-about once a week he has loose stools which resolved with Imodium. He denies any change in his abdominal pain no blood in the stool. His wife is concerned that he was not. Neuropathy-seen by podiatry and he will follow for nail trimming. He is out of gabapentin but needs the dose to be increased as it is wearing off.   Review of Systems   GEN- denies fatigue, fever, weight loss,weakness, recent illness HEENT- denies eye drainage, change in vision, nasal discharge, CVS- denies chest pain, palpitations, +leg pain RESP- denies SOB, cough, wheeze ABD- denies N/V, change in stools, +abd pain GU- denies dysuria, hematuria, dribbling, incontinence MSK- + joint pain, +muscle aches, injury Neuro- denies headache, dizziness, syncope, seizure activity     Objective:   Physical Exam GEN- NAD, alert and oriented x3, sitting in wheelchair, obese HEENT- PERRL, EOMI, non injected sclera, pink conjunctiva, MMM, oropharynx clear,lazy right eye, wears glasses CVS- RRR, no murmur RESP-CTAB ABD- NABS, soft, mild TTP over RUQ and epigastric, ND EXT- trace edema Pulses- Radial, DP- 2+ Skin-incision on back well healed Neuro- right hemiparesis,         Assessment & Plan:

## 2012-04-17 NOTE — Assessment & Plan Note (Signed)
Right hemiparesis

## 2012-04-17 NOTE — Assessment & Plan Note (Signed)
He is very infrequent diarrhea. This is not causing any pain or other significant problems. At this time he will use the Imodium. He's had extensive GI workup for abdominal pain

## 2012-04-17 NOTE — Patient Instructions (Signed)
Continue all medications Nerve pill changed to 2 tablets at bedtime, take 1 tablet in the morning and 1 in the afternoon  Follow-up with Dr.Nida Okay to use to immodium as needed for the diarrhea F/U 3 months

## 2012-04-17 NOTE — Assessment & Plan Note (Signed)
Followed by endocrinology. He has last to be done next week. Advised his wife that blood sugars fasting low 100s is optimal.

## 2012-04-17 NOTE — Assessment & Plan Note (Signed)
Blood pressure at goal no change in medication

## 2012-04-17 NOTE — Assessment & Plan Note (Signed)
I will continue gabapentin 300 mg twice a day and increase to 600 mg at bedtime

## 2012-04-25 ENCOUNTER — Telehealth: Payer: Self-pay | Admitting: Family Medicine

## 2012-04-25 MED ORDER — GABAPENTIN 300 MG PO CAPS: ORAL_CAPSULE | ORAL | Status: DC

## 2012-04-25 NOTE — Telephone Encounter (Signed)
Resent for a 90 day supply

## 2012-04-29 ENCOUNTER — Encounter (HOSPITAL_COMMUNITY): Payer: Self-pay | Admitting: *Deleted

## 2012-04-29 ENCOUNTER — Emergency Department (HOSPITAL_COMMUNITY): Payer: Medicare Other

## 2012-04-29 ENCOUNTER — Emergency Department (HOSPITAL_COMMUNITY)
Admission: EM | Admit: 2012-04-29 | Discharge: 2012-04-29 | Disposition: A | Discharge status: Home / Self Care | Payer: Medicare Other | Attending: Emergency Medicine | Admitting: Emergency Medicine

## 2012-04-29 DIAGNOSIS — K449 Diaphragmatic hernia without obstruction or gangrene: Secondary | ICD-10-CM | POA: Insufficient documentation

## 2012-04-29 DIAGNOSIS — K5730 Diverticulosis of large intestine without perforation or abscess without bleeding: Secondary | ICD-10-CM | POA: Insufficient documentation

## 2012-04-29 DIAGNOSIS — I10 Essential (primary) hypertension: Secondary | ICD-10-CM | POA: Insufficient documentation

## 2012-04-29 DIAGNOSIS — I69959 Hemiplegia and hemiparesis following unspecified cerebrovascular disease affecting unspecified side: Secondary | ICD-10-CM | POA: Insufficient documentation

## 2012-04-29 DIAGNOSIS — K7689 Other specified diseases of liver: Secondary | ICD-10-CM | POA: Insufficient documentation

## 2012-04-29 DIAGNOSIS — J209 Acute bronchitis, unspecified: Secondary | ICD-10-CM | POA: Insufficient documentation

## 2012-04-29 DIAGNOSIS — K2270 Barrett's esophagus without dysplasia: Secondary | ICD-10-CM | POA: Insufficient documentation

## 2012-04-29 DIAGNOSIS — M109 Gout, unspecified: Secondary | ICD-10-CM | POA: Insufficient documentation

## 2012-04-29 DIAGNOSIS — E119 Type 2 diabetes mellitus without complications: Secondary | ICD-10-CM | POA: Insufficient documentation

## 2012-04-29 DIAGNOSIS — E785 Hyperlipidemia, unspecified: Secondary | ICD-10-CM | POA: Insufficient documentation

## 2012-04-29 DIAGNOSIS — K5900 Constipation, unspecified: Secondary | ICD-10-CM | POA: Insufficient documentation

## 2012-04-29 DIAGNOSIS — R109 Unspecified abdominal pain: Secondary | ICD-10-CM | POA: Insufficient documentation

## 2012-04-29 LAB — CBC
HCT: 43.7 % (ref 39.0–52.0)
Hemoglobin: 14 g/dL (ref 13.0–17.0)
MCH: 23.9 pg — ABNORMAL LOW (ref 26.0–34.0)
MCHC: 32 g/dL (ref 30.0–36.0)
MCV: 74.4 fL — ABNORMAL LOW (ref 78.0–100.0)
RBC: 5.87 MIL/uL — ABNORMAL HIGH (ref 4.22–5.81)

## 2012-04-29 LAB — COMPREHENSIVE METABOLIC PANEL
Alkaline Phosphatase: 90 U/L (ref 39–117)
BUN: 24 mg/dL — ABNORMAL HIGH (ref 6–23)
Creatinine, Ser: 1.44 mg/dL — ABNORMAL HIGH (ref 0.50–1.35)
GFR calc Af Amer: 57 mL/min — ABNORMAL LOW (ref >90)
Glucose, Bld: 149 mg/dL — ABNORMAL HIGH (ref 70–99)
Potassium: 4.2 mEq/L (ref 3.5–5.1)
Total Protein: 7.9 g/dL (ref 6.0–8.3)

## 2012-04-29 LAB — DIFFERENTIAL
Basophils Relative: 0 % (ref 0–1)
Eosinophils Absolute: 0.1 10*3/uL (ref 0.0–0.7)
Eosinophils Relative: 1 % (ref 0–5)
Lymphs Abs: 1.5 10*3/uL (ref 0.7–4.0)
Monocytes Absolute: 0.5 10*3/uL (ref 0.1–1.0)
Monocytes Relative: 7 % (ref 3–12)

## 2012-04-29 LAB — URINALYSIS, ROUTINE W REFLEX MICROSCOPIC
Bilirubin Urine: NEGATIVE
Ketones, ur: NEGATIVE mg/dL
Leukocytes, UA: NEGATIVE
Nitrite: NEGATIVE
Specific Gravity, Urine: 1.025 (ref 1.005–1.030)
Urobilinogen, UA: 0.2 mg/dL (ref 0.0–1.0)

## 2012-04-29 LAB — LIPASE, BLOOD: Lipase: 31 U/L (ref 11–59)

## 2012-04-29 MED ORDER — IOHEXOL 300 MG/ML  SOLN
40.0000 mL | Freq: Once | INTRAMUSCULAR | Status: AC | PRN
  Administered 2012-04-29: 40 mL via ORAL

## 2012-04-29 MED ORDER — PEG 3350-KCL-NABCB-NACL-NASULF 236 G PO SOLR: 4.0000 L | Freq: Once | ORAL | Status: AC

## 2012-04-29 NOTE — ED Notes (Signed)
Last bm was Tuesday. Took laxative pills and castor oil with no results

## 2012-04-29 NOTE — ED Provider Notes (Signed)
History  This chart was scribed for Delora Fuel, MD by Valentina Shaggy. This patient was seen in room APA10/APA10 and the patient's care was started at 7:03AM.   CSN: [id number]  Arrival date & time 04/29/12  [id number]   None     Chief Complaint  Patient presents with  . Fecal Impaction    (Consider location/radiation/quality/duration/timing/severity/associated sxs/prior treatment) The history is provided by the patient and the spouse (wife).   [name] is a 66 y.o. male who presents to the Emergency Department complaining of 5 days of gradual onset, gradually worsening constipation. Pt states that his last bowel movement was on Tuesday and can pass gas. Pt rates his current stomach pain as a 6 out of 10. He states that he had episodes of diarrhea last week. Pt's wife states that his stomach is more distended then normal. Pt denies any modifying factors. He states that he took over the counter laxative pills and castor oil to relieve his symptoms with no relief.Pt denies chills, fever, nausea, vomiting but reports abdominal pain as associated symptoms.  Pt has a h/o diabetes, diverticulosis, GERD, HTN, CVA, gout, right sided stroke and internal hemorrhoids. Pt is a former everyday smoker and denies a h/o alcohol use.  Past Medical History  Diagnosis Date  . Barrett's esophagus     EGD 03/23/2011 & EGD 2/09 bx proven  . Steatohepatitis   . DM (diabetes mellitus)   . Diverticulosis     TCS 03/23/11 pancolonic diverticula &TCS 5/08, pancolonic diverticula  . GERD (gastroesophageal reflux disease)   . HTN (hypertension)   . CVA (cerebral infarction) 1998    right sided deficit  . Gout   . Lymphoma     XRT at [facility]  . Hepatitis     esosiniphilic, tx with prednisone  . Stroke     1998  . Renal insufficiency   . 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 Hiatal hernia   . Colon polyp 03/23/2011    tubular adenoma, Dr. Gala Romney  . Hemorrhoids, internal 03/23/2011   tcs by Dr. Gala Romney  . Hyperlipidemia     Past Surgical History  Procedure Date  . Cholecystectomy   . Right lymph node removal   . Right video-assisted thoracic surgery, pleurectomy, and pleurodesis 2011  . Esophagogastroduodenoscopy 02/05/08    goblet cell metaplasia/negative for H.pylori  . Colonoscopy 03/23/11    Dr. Gala Romney  pancolonic diverticula, hemorrhoids, tubular adenoma.  . Esophagogastroduodenoscopy 03/23/11    Dr. Gala Romney, barretts, hiatal hernia    Family History  Problem Relation Age of Onset  . Heart failure Father   . Heart failure Mother   . Heart failure Sister   . Heart failure Son     History  Substance Use Topics  . Smoking status: Former Smoker -- 3.0 packs/day    Types: Cigarettes    Quit date: 03/01/1997  . Smokeless tobacco: Never Used  . Alcohol Use: No      Review of Systems  Constitutional: Negative for fever and chills.  Gastrointestinal: Positive for abdominal pain and diarrhea (last week). Negative for nausea and vomiting.  All other systems reviewed and are negative.    Allergies  Review of patient's allergies indicates no known allergies.  Home Medications   Current Outpatient Rx  Name Route Sig Dispense Refill  . ALLOPURINOL 300 MG PO TABS Oral Take 300 mg by mouth daily.      [name] AMLODIPINE BESYLATE 10 MG PO  TABS Oral Take 10 mg by mouth daily.      [name] CLONIDINE HCL 0.2 MG PO TABS Oral Take 1 tablet (0.2 mg total) by mouth 3 (three) times daily. 270 tablet 1  . FEBUXOSTAT 40 MG PO TABS Oral Take 1 tablet (40 mg total) by mouth daily. 90 tablet 1  . GABAPENTIN 300 MG PO CAPS  1 tablet twice a day and 2 tablets at bedtime 360 capsule 0  . GLYBURIDE 5 MG PO TABS Oral Take 10 mg by mouth daily.     [name] HYDROCHLOROTHIAZIDE 25 MG PO TABS Oral Take 25 mg by mouth daily.      . IBUPROFEN 200 MG PO TABS Oral Take 200 mg by mouth every 6 (six) hours as needed.      . INSULIN GLARGINE 100 UNIT/ML [place] SOLN Subcutaneous Inject 40 Units into the skin  at bedtime.     . INSULIN LISPRO (HUMAN) 100 UNIT/ML  SOLN Subcutaneous Inject into the skin 3 (three) times daily before meals.    [name] LINAGLIPTIN 5 MG PO TABS Oral Take 5 mg by mouth daily.      [name] LISINOPRIL 20 MG PO TABS Oral Take 20 mg by mouth daily.      [name] METOPROLOL SUCCINATE ER 50 MG PO TB24 Oral Take 50 mg by mouth daily. Take with or immediately following a meal.    . COLON CLEANSER PO Oral Take 1 tablet by mouth daily.      [name] OMEPRAZOLE 40 MG PO CPDR Oral Take 40 mg by mouth daily.    [name] SIMVASTATIN 20 MG PO TABS Oral Take 1 tablet (20 mg total) by mouth at bedtime. 90 tablet 1  . TAMSULOSIN HCL 0.4 MG PO CAPS Oral Take 0.4 mg by mouth daily after breakfast.        Triage Vitals: BP 125/76  Pulse 79  Temp(Src) 98 F (36.7 C) (Oral)  Resp 24  Ht 6' (1.829 m)  Wt 268 lb (121.564 kg)  BMI 36.35 kg/m2  SpO2 99%  Physical Exam  Nursing note and vitals reviewed. Constitutional: He is oriented to person, place, and time. He appears well-developed and well-nourished.  HENT:  Head: Normocephalic and atraumatic.  Eyes: Conjunctivae and EOM are normal.  Neck: Normal range of motion. Neck supple.  Cardiovascular: Normal rate, regular rhythm and normal heart sounds.   Pulmonary/Chest: Effort normal and breath sounds normal.  Abdominal: He exhibits distension (slightly). There is tenderness (moderate tenderness difusly). There is no rebound and no guarding.       Bowel sounds decreased  Musculoskeletal: Normal range of motion.       extremities  2+ edema  Neurological: He is alert and oriented to person, place, and time.       Pt has a right facial droop, right hemiparesis and dysarthric speech  Skin: Skin is warm and dry.  Psychiatric: He has a normal mood and affect. His behavior is normal.    ED Course  Procedures (including critical care time) DIAGNOSTIC STUDIES: Oxygen Saturation is 99% on room air, normal by my interpretation.    COORDINATION OF CARE:   7:07AM:  Discussed ordering blood work to check for any abnormalities with pt and pt agreed   Results for orders placed during the hospital encounter of 04/29/12  CBC      Component Value Range   WBC 7.5  4.0 - 10.5 (K/uL)   RBC 5.87 (*) 4.22 - 5.81 (MIL/uL)   Hemoglobin 14.0  13.0 - 17.0 (g/dL)   HCT 43.7  39.0 - 52.0 (%)   MCV 74.4 (*) 78.0 - 100.0 (fL)   MCH 23.9 (*) 26.0 - 34.0 (pg)   MCHC 32.0  30.0 - 36.0 (g/dL)   RDW 16.4 (*) 11.5 - 15.5 (%)   Platelets 195  150 - 400 (K/uL)  DIFFERENTIAL      Component Value Range   Neutrophils Relative 62  43 - 77 (%)   Neutro Abs 4.7  1.7 - 7.7 (K/uL)   Lymphocytes Relative 20  12 - 46 (%)   Lymphs Abs 1.5  0.7 - 4.0 (K/uL)   Monocytes Relative 7  3 - 12 (%)   Monocytes Absolute 0.5  0.1 - 1.0 (K/uL)   Eosinophils Relative 1  0 - 5 (%)   Eosinophils Absolute 0.1  0.0 - 0.7 (K/uL)   Basophils Relative 0  0 - 1 (%)   Basophils Absolute 0.0  0.0 - 0.1 (K/uL)  COMPREHENSIVE METABOLIC PANEL      Component Value Range   Sodium 138  135 - 145 (mEq/L)   Potassium 4.2  3.5 - 5.1 (mEq/L)   Chloride 101  96 - 112 (mEq/L)   CO2 29  19 - 32 (mEq/L)   Glucose, Bld 149 (*) 70 - 99 (mg/dL)   BUN 24 (*) 6 - 23 (mg/dL)   Creatinine, Ser 1.44 (*) 0.50 - 1.35 (mg/dL)   Calcium 11.2 (*) 8.4 - 10.5 (mg/dL)   Total Protein 7.9  6.0 - 8.3 (g/dL)   Albumin 3.8  3.5 - 5.2 (g/dL)   AST 17  0 - 37 (U/L)   ALT 16  0 - 53 (U/L)   Alkaline Phosphatase 90  39 - 117 (U/L)   Total Bilirubin 0.2 (*) 0.3 - 1.2 (mg/dL)   GFR calc non Af Amer 50 (*) >90 (mL/min)   GFR calc Af Amer 57 (*) >90 (mL/min)  LIPASE, BLOOD      Component Value Range   Lipase 31  11 - 59 (U/L)  URINALYSIS, ROUTINE W REFLEX MICROSCOPIC      Component Value Range   Color, Urine YELLOW  YELLOW    APPearance CLEAR  CLEAR    Specific Gravity, Urine 1.025  1.005 - 1.030    pH 6.0  5.0 - 8.0    Glucose, UA NEGATIVE  NEGATIVE (mg/dL)   Hgb urine dipstick NEGATIVE  NEGATIVE    Bilirubin Urine  NEGATIVE  NEGATIVE    Ketones, ur NEGATIVE  NEGATIVE (mg/dL)   Protein, ur NEGATIVE  NEGATIVE (mg/dL)   Urobilinogen, UA 0.2  0.0 - 1.0 (mg/dL)   Nitrite NEGATIVE  NEGATIVE    Leukocytes, UA NEGATIVE  NEGATIVE    Ct Abdomen Pelvis Wo Contrast  04/29/2012  *RADIOLOGY REPORT*  Clinical Data: Fecal impaction  CT ABDOMEN AND PELVIS WITHOUT CONTRAST  Technique:  Multidetector CT imaging of the abdomen and pelvis was performed following the standard protocol without intravenous contrast.  Comparison: CT abdomen pelvis - 10/22/2011; 05/08/2011; fluoroscopic guided L2 vertebral body biopsy - 07/13/2011  Findings:  The lack of intravenous contrast limits the ability to evaluate solid abdominal organs.  Normal hepatic contour.  Post cholecystectomy.  No ascites.  The previously characterized bilateral renal cysts are grossly unchanged, though incompletely evaluated on this noncontrast examination.  There is grossly unchanged mild bilateral likely age related perinephric stranding.  No renal stones.  No urinary obstruction.  There is unchanged mild diffuse thickening of the  bilateral adrenal glands without discrete nodule.  Normal noncontrast appearance of the pancreas and spleen.  Ingested enteric contrast extends to the level of the distal small bowel.  Moderate to large colonic stool burden without definite evidence of obstruction.  Colonic diverticulosis without evidence of diverticulitis on this noncontrast examination.  Normal noncontrast appearance of the retrocecal appendix.  No pneumoperitoneum, pneumatosis or portal venous gas.  Scattered atherosclerotic calcifications within the nonaneurysmal abdominal aorta.  No retroperitoneal, mesenteric, pelvic or inguinal lymphadenopathy.  Pelvic organs are normal.  No free fluid in the pelvis.  Limited visualization of the lower thorax is negative for focal airspace opacity or pleural effusion.  No acute or aggressive osseous abnormalities.  Old right-sided posterior  rib fractures. There is unchanged sclerosis and endplate deformity within the L2 vertebral body, grossly stable since the 04/2011 examination. Small mesenteric fat containing periumbilical hernia.  IMPRESSION: 1.  Moderate to large colonic stool burden without definite evidence of enteric obstruction. 2.  Extensive colonic diverticulosis without definite evidence of diverticulitis. 3.  Unchanged increased sclerosis and deformity inferior endplate of the L2 vertebral body, grossly stable since 01/16/2011 examination, previously biopsied with benign pathology.  Original Report Authenticated By: Rachel Moulds, M.D.      1. Abdominal pain   2. Constipation   3. Hypercalcemia       MDM  Abdominal distention and pain probably secondary to constipation. However, as will get CT scan to rule out other pathology.  Laboratory workup is significant for hypercalcemia and this may be contributing to his abdominal pain and constipation. However, there is not severely high and can be addressed as an outpatient. He did have a small bowel movement in the emergency department. He will be treated with GoLYTELY and is instructed to be more aggressive about proper bowel habits.      I personally performed the services described in this documentation, which was scribed in my presence. The recorded information has been reviewed and considered.      Delora Fuel, MD 82/80/03 [id number]

## 2012-04-29 NOTE — Discharge Instructions (Signed)
Your blood calcium is slightly high today. That may be contributing to abdominal pain and constipation. Talk with your doctor about getting started on some medication to try and lower your calcium.  Constipation in Adults Constipation is having fewer than 2 bowel movements per week. Usually, the stools are hard. As we grow older, constipation is more common. If you try to fix constipation with laxatives, the problem may get worse. This is because laxatives taken over a long period of time make the colon muscles weaker. A low-fiber diet, not taking in enough fluids, and taking some medicines may make these problems worse. MEDICATIONS THAT MAY CAUSE CONSTIPATION  Water pills (diuretics).   Calcium channel blockers (used to control blood pressure and for the heart).   Certain pain medicines (narcotics).   Anticholinergics.   Anti-inflammatory agents.   Antacids that contain aluminum.  DISEASES THAT CONTRIBUTE TO CONSTIPATION  Diabetes.   Parkinson's disease.   Dementia.   Stroke.   Depression.   Illnesses that cause problems with salt and water metabolism.  HOME CARE INSTRUCTIONS   Constipation is usually best cared for without medicines. Increasing dietary fiber and eating more fruits and vegetables is the best way to manage constipation.   Slowly increase fiber intake to 25 to 38 grams per day. Whole grains, fruits, vegetables, and legumes are good sources of fiber. A dietitian can further help you incorporate high-fiber foods into your diet.   Drink enough water and fluids to keep your urine clear or pale yellow.   A fiber supplement may be added to your diet if you cannot get enough fiber from foods.   Increasing your activities also helps improve regularity.   Suppositories, as suggested by your caregiver, will also help. If you are using antacids, such as aluminum or calcium containing products, it will be helpful to switch to products containing magnesium if your  caregiver says it is okay.   If you have been given a liquid injection (enema) today, this is only a temporary measure. It should not be relied on for treatment of longstanding (chronic) constipation.   Stronger measures, such as magnesium sulfate, should be avoided if possible. This may cause uncontrollable diarrhea. Using magnesium sulfate may not allow you time to make it to the bathroom.  SEEK IMMEDIATE MEDICAL CARE IF:   There is bright red blood in the stool.   The constipation stays for more than 4 days.   There is belly (abdominal) or rectal pain.   You do not seem to be getting better.   You have any questions or concerns.  MAKE SURE YOU:   Understand these instructions.   Will watch your condition.   Will get help right away if you are not doing well or get worse.  Document Released: 08/26/2004 Document Revised: 11/17/2011 Document Reviewed: 11/01/2011 [facility] Patient Information 2012 [place], [place].  Hypercalcemia Hypercalcemia means the calcium in your blood is too high. A level above 10.5 milligrams per deciliter of blood is considered high. Calcium in our blood is important for the control of many things, such as:  Blood clotting.   Conducting of nerve impulses.   Muscle contraction.   Maintaining teeth and bone health.   Other body functions.  In the bloodstream, calcium maintains a constant balance with another mineral, phosphate. Calcium is absorbed into the body through the small intestine. This is helped by Vitamin D. Calcium levels are maintained mostly by vitamin D and a hormone (parathyroid hormone). But the kidneys also  help. Hypercalcemia can happen when the concentration of calcium is too high for the kidneys to maintain balance. The body maintains a balance between the calcium we eat and the calcium already in our body. If calcium intake is increased or we cannot use calcium properly, there may be problems. Some common sources of calcium are:    Dairy products.   Nuts.   Eggs.   Whole grains.   Legumes.   Green leafy vegetables.  CAUSES There are many causes of this condition, but some common ones are:  Hyperparathyroidism. This is an over activity of the parathyroid gland.   Cancers of the breast, kidney, lung, head and neck are common causes of calcium increases.   Medications that cause you to urinate more often (diuretics), nausea, vomiting and diarrhea also increase the calcium in the blood.   Overuse of calcium-containing antacids.  SYMPTOMS  Many patients with mild hypercalcemia have no symptoms. For those with symptoms common problems include:  Loss of appetite.   Constipation.   Increased thirst.   Heart rhythm changes.   Abnormal thinking.   Nausea.   Abdominal pain.   Kidney stones.   Mood swings.   Coma and death when severe.   Vomiting.   Increased urination.   High blood pressure.   Confusion.  DIAGNOSIS   Your caregiver will do a medical history and perform a physical exam on you.   Calcium and parathyroid hormone (PTH) may be measured with a blood test.  TREATMENT   The treatment depends on the calcium level and what is causing the higher level. Hypercalcemia can be lifethreatening. Fast lowering of the calcium level may be necessary.   With normal kidney function, fluids can be given by vein to clear the excess calcium. Hemodialysis works well to reduce dangerous calcium levels if there is poor kidney function. This is a procedure in which a machine is used to filter out unwanted substances. The blood is then returned to the body.   Drugs, such as diuretics, can be given after adequate fluid intake is established. These medications help the kidneys get rid of extra calcium. Drugs that lessen (inhibit) bone loss are helpful in gaining long-term control. Phosphate pills help lower high calcium levels caused by a low supply of phosphate. Anti-inflammatory agents such as steroids  are helpful with some cancers and toxic levels of vitamin D.   Treatment of the underlying cause of the hypercalcemia will also correct the imbalance. Hyperparathyroidism is usually treated by surgical removal of one or more of the parathyroid glands and any tissue, other than the glands themselves, that is producing too much hormone.   The hypercalcemia caused by cancer is difficult to treat without controlling the cancer. Symptoms can be improved with fluids and drug therapy as outlined above.  PROGNOSIS   Surgery to remove the parathyroid glands is usually successful. This also depends on the amount of damage to the kidneys and whether or not it can be treated.   Mild hypercalcemia can be controlled with good fluid intake and the use of effective medications.   Hypercalcemia often develops as a late complication of cancer. The expected outlook is poor without effective anticancer therapy.  PREVENTION   If you are at risk for developing hypercalcemia, be familiar with early symptoms. Report these to your caregiver.   Good fluid intake (up to four quarts of liquid a day if possible) is helpful.   Try to control nausea and vomiting, and treat fevers to  avoid dehydration.   Lowering the amount of calcium in your diet is not necessary. High blood calcium reduces absorption of calcium in the intestine.   Stay as active as possible.  SEEK IMMEDIATE MEDICAL CARE IF:   You develop chest pain, sweating, or shortness of breath.   You get confused, feel faint or pass out.   You develop severe nausea and vomiting.  MAKE SURE YOU:   Understand these instructions.   Will watch your condition.   Will get help right away if you are not doing well or get worse.  Document Released: 02/11/2005 Document Revised: 11/17/2011 Document Reviewed: 11/23/2010 [facility] Patient Information 2012 [place].

## 2012-04-29 NOTE — ED Notes (Signed)
Unable to obtain iv access and ct changed to with iv contrast.

## 2012-04-29 NOTE — ED Notes (Signed)
Several nurses have attempted to obtain iv access but were unsuccessful. Nurses are now trying to the aid of the vein finder to find access.

## 2012-05-01 MED ORDER — PEG 3350-KCL-NABCB-NACL-NASULF 236 G PO SOLR: 4.0000 L | Freq: Once | ORAL | Status: AC

## 2012-06-06 ENCOUNTER — Inpatient Hospital Stay (HOSPITAL_COMMUNITY)
Admission: EM | Admit: 2012-06-06 | Discharge: 2012-06-09 | DRG: 392 | Disposition: A | Discharge status: Home / Self Care | Payer: Medicare Other | Attending: Internal Medicine | Admitting: Internal Medicine

## 2012-06-06 ENCOUNTER — Encounter (HOSPITAL_COMMUNITY): Payer: Self-pay | Admitting: *Deleted

## 2012-06-06 ENCOUNTER — Emergency Department (HOSPITAL_COMMUNITY): Payer: Medicare Other

## 2012-06-06 ENCOUNTER — Ambulatory Visit (HOSPITAL_COMMUNITY)
Admit: 2012-06-06 | Discharge: 2012-06-06 | Disposition: A | Discharge status: Home / Self Care | Payer: Medicare Other | Source: Ambulatory Visit | Attending: Emergency Medicine | Admitting: Emergency Medicine

## 2012-06-06 DIAGNOSIS — K6289 Other specified diseases of anus and rectum: Secondary | ICD-10-CM

## 2012-06-06 DIAGNOSIS — K922 Gastrointestinal hemorrhage, unspecified: Secondary | ICD-10-CM

## 2012-06-06 DIAGNOSIS — Z87898 Personal history of other specified conditions: Secondary | ICD-10-CM

## 2012-06-06 DIAGNOSIS — D500 Iron deficiency anemia secondary to blood loss (chronic): Secondary | ICD-10-CM | POA: Diagnosis present

## 2012-06-06 DIAGNOSIS — I69959 Hemiplegia and hemiparesis following unspecified cerebrovascular disease affecting unspecified side: Secondary | ICD-10-CM

## 2012-06-06 DIAGNOSIS — R109 Unspecified abdominal pain: Secondary | ICD-10-CM | POA: Diagnosis present

## 2012-06-06 DIAGNOSIS — K2270 Barrett's esophagus without dysplasia: Secondary | ICD-10-CM

## 2012-06-06 DIAGNOSIS — E119 Type 2 diabetes mellitus without complications: Secondary | ICD-10-CM

## 2012-06-06 DIAGNOSIS — K5289 Other specified noninfective gastroenteritis and colitis: Principal | ICD-10-CM | POA: Diagnosis present

## 2012-06-06 DIAGNOSIS — R197 Diarrhea, unspecified: Secondary | ICD-10-CM

## 2012-06-06 DIAGNOSIS — N289 Disorder of kidney and ureter, unspecified: Secondary | ICD-10-CM | POA: Diagnosis present

## 2012-06-06 DIAGNOSIS — Z794 Long term (current) use of insulin: Secondary | ICD-10-CM

## 2012-06-06 DIAGNOSIS — I10 Essential (primary) hypertension: Secondary | ICD-10-CM

## 2012-06-06 DIAGNOSIS — M109 Gout, unspecified: Secondary | ICD-10-CM | POA: Diagnosis present

## 2012-06-06 DIAGNOSIS — K529 Noninfective gastroenteritis and colitis, unspecified: Secondary | ICD-10-CM

## 2012-06-06 DIAGNOSIS — L0293 Carbuncle, unspecified: Secondary | ICD-10-CM

## 2012-06-06 DIAGNOSIS — E785 Hyperlipidemia, unspecified: Secondary | ICD-10-CM | POA: Diagnosis present

## 2012-06-06 DIAGNOSIS — E669 Obesity, unspecified: Secondary | ICD-10-CM

## 2012-06-06 DIAGNOSIS — Z87891 Personal history of nicotine dependence: Secondary | ICD-10-CM

## 2012-06-06 DIAGNOSIS — E1140 Type 2 diabetes mellitus with diabetic neuropathy, unspecified: Secondary | ICD-10-CM

## 2012-06-06 DIAGNOSIS — R1011 Right upper quadrant pain: Secondary | ICD-10-CM

## 2012-06-06 DIAGNOSIS — I6350 Cerebral infarction due to unspecified occlusion or stenosis of unspecified cerebral artery: Secondary | ICD-10-CM

## 2012-06-06 DIAGNOSIS — K759 Inflammatory liver disease, unspecified: Secondary | ICD-10-CM

## 2012-06-06 DIAGNOSIS — Z79899 Other long term (current) drug therapy: Secondary | ICD-10-CM

## 2012-06-06 DIAGNOSIS — Z8249 Family history of ischemic heart disease and other diseases of the circulatory system: Secondary | ICD-10-CM

## 2012-06-06 DIAGNOSIS — J9819 Other pulmonary collapse: Secondary | ICD-10-CM | POA: Insufficient documentation

## 2012-06-06 DIAGNOSIS — G8929 Other chronic pain: Secondary | ICD-10-CM | POA: Diagnosis present

## 2012-06-06 DIAGNOSIS — J90 Pleural effusion, not elsewhere classified: Secondary | ICD-10-CM | POA: Insufficient documentation

## 2012-06-06 DIAGNOSIS — K219 Gastro-esophageal reflux disease without esophagitis: Secondary | ICD-10-CM | POA: Diagnosis present

## 2012-06-06 DIAGNOSIS — I69920 Aphasia following unspecified cerebrovascular disease: Secondary | ICD-10-CM

## 2012-06-06 DIAGNOSIS — S34112A 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, initial encounter: Secondary | ICD-10-CM

## 2012-06-06 DIAGNOSIS — K5730 Diverticulosis of large intestine without perforation or abscess without bleeding: Secondary | ICD-10-CM | POA: Diagnosis present

## 2012-06-06 DIAGNOSIS — E1122 Type 2 diabetes mellitus with diabetic chronic kidney disease: Secondary | ICD-10-CM | POA: Diagnosis present

## 2012-06-06 DIAGNOSIS — K921 Melena: Secondary | ICD-10-CM

## 2012-06-06 HISTORY — DX: Noninfective gastroenteritis and colitis, unspecified: K52.9

## 2012-06-06 LAB — CBC WITH DIFFERENTIAL/PLATELET
Basophils Relative: 0 % (ref 0–1)
Eosinophils Relative: 5 % (ref 0–5)
HCT: 48.1 % (ref 39.0–52.0)
Hemoglobin: 15.3 g/dL (ref 13.0–17.0)
Lymphs Abs: 0.9 10*3/uL (ref 0.7–4.0)
MCH: 24.2 pg — ABNORMAL LOW (ref 26.0–34.0)
MCV: 76.1 fL — ABNORMAL LOW (ref 78.0–100.0)
Monocytes Absolute: 0.8 10*3/uL (ref 0.1–1.0)
Neutro Abs: 8.7 10*3/uL — ABNORMAL HIGH (ref 1.7–7.7)
Neutrophils Relative %: 80 % — ABNORMAL HIGH (ref 43–77)
RBC: 6.32 MIL/uL — ABNORMAL HIGH (ref 4.22–5.81)

## 2012-06-06 LAB — COMPREHENSIVE METABOLIC PANEL
Alkaline Phosphatase: 70 U/L (ref 39–117)
BUN: 22 mg/dL (ref 6–23)
Creatinine, Ser: 1.16 mg/dL (ref 0.50–1.35)
GFR calc Af Amer: 75 mL/min — ABNORMAL LOW (ref >90)
Glucose, Bld: 121 mg/dL — ABNORMAL HIGH (ref 70–99)
Potassium: 4.8 mEq/L (ref 3.5–5.1)
Total Bilirubin: 0.3 mg/dL (ref 0.3–1.2)
Total Protein: 7.6 g/dL (ref 6.0–8.3)

## 2012-06-06 LAB — LIPASE, BLOOD: Lipase: 19 U/L (ref 11–59)

## 2012-06-06 MED ORDER — IOHEXOL 300 MG/ML  SOLN
100.0000 mL | Freq: Once | INTRAMUSCULAR | Status: AC | PRN
  Administered 2012-06-06: 100 mL via INTRAVENOUS

## 2012-06-06 MED ORDER — METRONIDAZOLE IN NACL 5-0.79 MG/ML-% IV SOLN
500.0000 mg | Freq: Once | INTRAVENOUS | Status: DC
  Administered 2012-06-07: 500 mg via INTRAVENOUS

## 2012-06-06 MED ORDER — ONDANSETRON HCL 4 MG/2ML IJ SOLN
4.0000 mg | Freq: Once | INTRAMUSCULAR | Status: AC
  Administered 2012-06-06: 4 mg via INTRAVENOUS
  Filled 2012-06-06: qty 2

## 2012-06-06 MED ORDER — SODIUM CHLORIDE 0.9 % IV BOLUS (SEPSIS)
250.0000 mL | Freq: Once | INTRAVENOUS | Status: AC
  Administered 2012-06-06: 250 mL via INTRAVENOUS

## 2012-06-06 MED ORDER — SODIUM CHLORIDE 0.9 % IV SOLN: INTRAVENOUS | Status: DC

## 2012-06-06 MED ORDER — CIPROFLOXACIN IN D5W 400 MG/200ML IV SOLN
400.0000 mg | Freq: Once | INTRAVENOUS | Status: AC
  Administered 2012-06-06: 400 mg via INTRAVENOUS
  Filled 2012-06-06: qty 200

## 2012-06-06 MED ORDER — INSULIN ASPART 100 UNIT/ML ~~LOC~~ SOLN: 0.0000 [IU] | Freq: Every day | SUBCUTANEOUS | Status: DC

## 2012-06-06 MED ORDER — HYDROMORPHONE HCL PF 1 MG/ML IJ SOLN
0.5000 mg | Freq: Once | INTRAMUSCULAR | Status: AC
  Administered 2012-06-06: 0.5 mg via INTRAVENOUS
  Filled 2012-06-06: qty 1

## 2012-06-06 MED ORDER — SODIUM CHLORIDE 0.9 % IV SOLN
INTRAVENOUS | Status: DC
  Administered 2012-06-06: 15:00:00 via INTRAVENOUS

## 2012-06-06 MED ORDER — INSULIN ASPART 100 UNIT/ML ~~LOC~~ SOLN
0.0000 [IU] | Freq: Three times a day (TID) | SUBCUTANEOUS | Status: DC
  Administered 2012-06-07 (×3): 2 [IU] via SUBCUTANEOUS
  Administered 2012-06-08: 8 [IU] via SUBCUTANEOUS
  Administered 2012-06-08: 2 [IU] via SUBCUTANEOUS
  Administered 2012-06-08: 3 [IU] via SUBCUTANEOUS
  Administered 2012-06-09: 2 [IU] via SUBCUTANEOUS
  Administered 2012-06-09: 5 [IU] via SUBCUTANEOUS

## 2012-06-06 MED ORDER — HYDROMORPHONE HCL PF 1 MG/ML IJ SOLN: 1.0000 mg | INTRAMUSCULAR | Status: DC | PRN

## 2012-06-06 MED ORDER — ONDANSETRON HCL 4 MG/2ML IJ SOLN: 4.0000 mg | Freq: Three times a day (TID) | INTRAMUSCULAR | Status: DC | PRN

## 2012-06-06 NOTE — ED Notes (Signed)
Pt taken to [facility] for Ct by [employer]

## 2012-06-06 NOTE — ED Notes (Signed)
Abd pain, diarrhea, since Monday. Has been talking imodium without relief.   Nausea, no vomiting.

## 2012-06-06 NOTE — ED Provider Notes (Signed)
History   This chart was scribed for Mervin Kung, MD by Carolyne Littles. The patient was seen in room APA11/APA11. Patient's care was started at 1404.    CSN: [id number]  Arrival date & time 06/06/12  1404   First MD Initiated Contact with Patient 06/06/12 1418      Chief Complaint  Patient presents with  . Abdominal Pain    (Consider location/radiation/quality/duration/timing/severity/associated sxs/prior treatment) HPI [name] is a 66 y.o. male who presents to the Emergency Department complaining of left sided, non-radiating abdominal pain described as cramping onset 2 days ago and persistent since with associated diarrhea. Reports having 10 episodes of diarrhea today. Patient rates abdominal pain a 7 out of 10 currently. Denies nausea, vomiting, hematochezia, fever, HA, sore throat, congestion, neck pain, back pain, chest pain, SOB, cough, dysuria, rash, bleeding easily, myalgias. Patient with h/o diabetes, diverticulitis, GERD, HTN, lymphoma, stroke, renal insufficiency, internal hemorrhoids, colon polyp, barrett's esophagus.   Past Medical History  Diagnosis Date  . Barrett's esophagus     EGD 03/23/2011 & EGD 2/09 bx proven  . Steatohepatitis   . DM (diabetes mellitus)   . Diverticulosis     TCS 03/23/11 pancolonic diverticula &TCS 5/08, pancolonic diverticula  . GERD (gastroesophageal reflux disease)   . HTN (hypertension)   . CVA (cerebral infarction) 1998    right sided deficit  . Gout   . Lymphoma     XRT at [facility]  . Hepatitis     esosiniphilic, tx with prednisone  . Stroke     1998  . Renal insufficiency   . 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 Hiatal hernia   . Colon polyp 03/23/2011    tubular adenoma, Dr. Gala Romney  . Hemorrhoids, internal 03/23/2011    tcs by Dr. Gala Romney  . Hyperlipidemia   . Lymphoma     Past Surgical History  Procedure Date  . Cholecystectomy   . Right lymph node removal   . Right video-assisted thoracic  surgery, pleurectomy, and pleurodesis 2011  . Esophagogastroduodenoscopy 02/05/08    goblet cell metaplasia/negative for H.pylori  . Colonoscopy 03/23/11    Dr. Gala Romney  pancolonic diverticula, hemorrhoids, tubular adenoma.  . Esophagogastroduodenoscopy 03/23/11    Dr. Gala Romney, barretts, hiatal hernia    Family History  Problem Relation Age of Onset  . Heart failure Father   . Heart failure Mother   . Heart failure Sister   . Heart failure Son     History  Substance Use Topics  . Smoking status: Former Smoker -- 3.0 packs/day    Types: Cigarettes    Quit date: 03/01/1997  . Smokeless tobacco: Never Used  . Alcohol Use: No      Review of Systems  Constitutional: Negative for fever and chills.  HENT: Negative for rhinorrhea and neck pain.   Eyes: Negative for pain.  Respiratory: Negative for cough and shortness of breath.   Cardiovascular: Negative for chest pain.  Gastrointestinal: Positive for abdominal pain and diarrhea. Negative for nausea and vomiting.  Genitourinary: Negative for dysuria.  Musculoskeletal: Negative for back pain.  Skin: Negative for rash.  Neurological: Negative for dizziness and weakness.    Allergies  Review of patient's allergies indicates no known allergies.  Home Medications   Current Outpatient Rx  Name Route Sig Dispense Refill  . ALLOPURINOL 300 MG PO TABS Oral Take 300 mg by mouth daily.      [name] AMLODIPINE BESYLATE 10 MG PO  TABS Oral Take 10 mg by mouth daily.      [name] CLONIDINE HCL 0.2 MG PO TABS Oral Take 1 tablet (0.2 mg total) by mouth 3 (three) times daily. 270 tablet 1  . FEBUXOSTAT 40 MG PO TABS Oral Take 1 tablet (40 mg total) by mouth daily. 90 tablet 1  . GABAPENTIN 300 MG PO CAPS Oral Take 300-600 mg by mouth 3 (three) times daily. 1 tablet twice a day and 2 tablets at bedtime    . HYDROCHLOROTHIAZIDE 25 MG PO TABS Oral Take 25 mg by mouth daily.      . INSULIN GLARGINE 100 UNIT/ML [place] SOLN Subcutaneous Inject 60 Units into the skin  at bedtime.     . INSULIN LISPRO (HUMAN) 100 UNIT/ML [place] SOLN Subcutaneous Inject 14-17 Units into the skin 3 (three) times daily before meals. Uses according to sliding scale at home.    [name] LINAGLIPTIN 5 MG PO TABS Oral Take 5 mg by mouth daily.      [name] LISINOPRIL 20 MG PO TABS Oral Take 20 mg by mouth daily.      [name] METOPROLOL SUCCINATE ER 50 MG PO TB24 Oral Take 50 mg by mouth daily. Take with or immediately following a meal.    . OMEPRAZOLE 40 MG PO CPDR Oral Take 40 mg by mouth daily.    [name] SIMVASTATIN 20 MG PO TABS Oral Take 1 tablet (20 mg total) by mouth at bedtime. 90 tablet 1  . TAMSULOSIN HCL 0.4 MG PO CAPS Oral Take 0.4 mg by mouth daily after breakfast.        BP 119/72  Pulse 95  Temp 99.1 F (37.3 C) (Oral)  Resp 20  Ht 6' 1"  (1.854 m)  Wt 260 lb (117.935 kg)  BMI 34.30 kg/m2  SpO2 98%  Physical Exam  Nursing note and vitals reviewed. Constitutional: He is oriented to person, place, and time. He appears well-developed and well-nourished. No distress.  HENT:  Head: Normocephalic and atraumatic.  Mouth/Throat: Oropharynx is clear and moist.       Mucous membranes moist.   Eyes: Conjunctivae and EOM are normal. Pupils are equal, round, and reactive to light.       Left eye does not track.   Neck: Neck supple. No tracheal deviation present.  Cardiovascular: Normal rate, regular rhythm and normal heart sounds.  Exam reveals no gallop and no friction rub.   No murmur heard. Pulmonary/Chest: Effort normal and breath sounds normal. No respiratory distress. He has no wheezes. He has no rales.  Abdominal: Soft. Bowel sounds are normal. He exhibits no distension. There is tenderness (diffuse). There is no guarding.  Musculoskeletal: Normal range of motion. He exhibits edema (1+ bilateral lower extremities).  Neurological: He is alert and oriented to person, place, and time. No sensory deficit.       Right sided hemiplegia c/w reported stroke.   Skin: Skin is warm and dry.    Psychiatric: He has a normal mood and affect. His behavior is normal.    ED Course  Procedures (including critical care time)  DIAGNOSTIC STUDIES: Oxygen Saturation is 98% on room air, normal by my interpretation.    COORDINATION OF CARE: 2:44PM-Patient informed of current plan for treatment and evaluation and agrees with plan at this time.     Labs Reviewed  CBC WITH DIFFERENTIAL - Abnormal; Notable for the following:    WBC 11.0 (*)     RBC 6.32 (*)     MCV  76.1 (*)     MCH 24.2 (*)     RDW 18.0 (*)     Neutrophils Relative 80 (*)     Lymphocytes Relative 8 (*)     Neutro Abs 8.7 (*)     All other components within normal limits  COMPREHENSIVE METABOLIC PANEL - Abnormal; Notable for the following:    Sodium 134 (*)     Glucose, Bld 121 (*)     Calcium 10.7 (*)     GFR calc non Af Amer 64 (*)     GFR calc Af Amer 75 (*)     All other components within normal limits  GLUCOSE, CAPILLARY - Abnormal; Notable for the following:    Glucose-Capillary 124 (*)     All other components within normal limits  LIPASE, BLOOD  GI PATHOGEN PANEL BY PCR, STOOL   Ct Abdomen Pelvis W Contrast  06/06/2012  *RADIOLOGY REPORT*  Clinical Data: Abdominal pain  CT ABDOMEN AND PELVIS WITH CONTRAST  Technique:  Multidetector CT imaging of the abdomen and pelvis was performed following the standard protocol during bolus administration of intravenous contrast.  Contrast: 193m OMNIPAQUE IOHEXOL 300 MG/ML  SOLN  Comparison: 04/29/2012  Findings: Tiny right pleural effusion.  Small left pleural effusion.  Dependent atelectasis.  Cysts in the left lung have a chronic appearance.  Healing right-sided rib fractures with deformity.  No pneumothorax.  Small amount of pericardial fluid is likely physiologic.  Post cholecystectomy.  Liver, pancreas, spleen, adrenal glands stable.  Renal cysts are stable.  There is wall thickening of the sigmoid colon and rectum which is mild.  This is associated with  diverticulosis.  Little if any stranding in the adjacent fat.  No extraluminal bowel gas.  No abscess.  Small umbilical hernia containing adipose tissue.  Normal appendix.  Bladder and prostate are unremarkable.  Stable mixed sclerotic and lytic changes of L2 with an inferior endplate deformity.  IMPRESSION: Wall thickening of the sigmoid colon and rectum worrisome for inflammatory process.  Consider diverticulitis, infectious colitis, inflammatory bowel disease.  Small left pleural effusion.  Tiny right pleural effusion.  Otherwise stable study.  Original Report Authenticated By: AJamas Lav M.D.   Dg Abd Acute W/chest  06/06/2012  *RADIOLOGY REPORT*  Clinical Data: Chest pain.  Abdominal cramping.  Diarrhea.  Back pain.  Shortness of breath.  Dysuria.  ACUTE ABDOMEN SERIES (ABDOMEN 2 VIEW & CHEST 1 VIEW)  Comparison: 04/29/2012  Findings: Tortuous thoracic aorta noted.  Emphysema is present with rib deformities related to remote rib fractures, and mild chronic interstitial accentuation.  Wedge resection clips noted in the right lung.  No free intraperitoneal gas is observed beneath the hemidiaphragms. No abnormal air-fluid levels. Technical factors related to patient body habitus reduce diagnostic sensitivity and specificity.  No dilated bowel observed.  IMPRESSION:  1.  Unremarkable bowel gas pattern. 2.  Emphysema with chronic interstitial accentuation. 3.  Tortuous thoracic aorta.  Original Report Authenticated By: WCarron Curie M.D.   Results for orders placed during the hospital encounter of 06/06/12  CBC WITH DIFFERENTIAL      Component Value Range   WBC 11.0 (*) 4.0 - 10.5 K/uL   RBC 6.32 (*) 4.22 - 5.81 MIL/uL   Hemoglobin 15.3  13.0 - 17.0 g/dL   HCT 48.1  39.0 - 52.0 %   MCV 76.1 (*) 78.0 - 100.0 fL   MCH 24.2 (*) 26.0 - 34.0 pg   MCHC 31.8  30.0 - 36.0  g/dL   RDW 18.0 (*) 11.5 - 15.5 %   Platelets    150 - 400 K/uL   Value: PLATELET CLUMPS NOTED ON SMEAR, COUNT APPEARS  ADEQUATE   Neutrophils Relative 80 (*) 43 - 77 %   Lymphocytes Relative 8 (*) 12 - 46 %   Monocytes Relative 7  3 - 12 %   Eosinophils Relative 5  0 - 5 %   Basophils Relative 0  0 - 1 %   Neutro Abs 8.7 (*) 1.7 - 7.7 K/uL   Lymphs Abs 0.9  0.7 - 4.0 K/uL   Monocytes Absolute 0.8  0.1 - 1.0 K/uL   Eosinophils Absolute 0.6  0.0 - 0.7 K/uL   Basophils Absolute 0.0  0.0 - 0.1 K/uL  COMPREHENSIVE METABOLIC PANEL      Component Value Range   Sodium 134 (*) 135 - 145 mEq/L   Potassium 4.8  3.5 - 5.1 mEq/L   Chloride 101  96 - 112 mEq/L   CO2 19  19 - 32 mEq/L   Glucose, Bld 121 (*) 70 - 99 mg/dL   BUN 22  6 - 23 mg/dL   Creatinine, Ser 1.16  0.50 - 1.35 mg/dL   Calcium 10.7 (*) 8.4 - 10.5 mg/dL   Total Protein 7.6  6.0 - 8.3 g/dL   Albumin 3.6  3.5 - 5.2 g/dL   AST 33  0 - 37 U/L   ALT 15  0 - 53 U/L   Alkaline Phosphatase 70  39 - 117 U/L   Total Bilirubin 0.3  0.3 - 1.2 mg/dL   GFR calc non Af Amer 64 (*) >90 mL/min   GFR calc Af Amer 75 (*) >90 mL/min  LIPASE, BLOOD      Component Value Range   Lipase 19  11 - 59 U/L  GLUCOSE, CAPILLARY      Component Value Range   Glucose-Capillary 124 (*) 70 - 99 mg/dL     1. Abdominal pain   2. Diarrhea   3. Colitis       MDM  Patient with abdominal pain and diarrhea multiple surgeries in the past also history of diverticulosis. Patient will be CT of abdomen due to his age and prior surgeries to make sure nothing acute going on inside the abdomen CT scan is nonfunctional here today patient was retransferred to [facility] for [facility] scan and return here for disposition. [facility] will do the transfer. Labs without significant abnormalities.  CT scan results showing inflammation of the distal part of the colon could be related to some diverticulitis or inflammatory bowel disease or inflammatory infectious process I suspect is most likely inflammatory infectious process do to the significant amount of diarrhea patient is having. Patient prefers to  be admitted because his had to go to the bathroom so much today we'll go head and treat with antibiotics to cover for both infectious diarrhea and perhaps diverticulitis chosen Cipro and Flagyl patient is currently not on an antibiotic but C. difficile has been sent. Will contact the hospitalist team about admission.   I personally performed the services described in this documentation, which was scribed in my presence. The recorded information has been reviewed and considered.     Mervin Kung, MD 06/06/12 [phone]

## 2012-06-06 NOTE — H&P (Signed)
PCP:   Vic Blackbird, MD   Chief Complaint:  Bloody diarrhea for 3 days  HPI: [name] is an 66 y.o. male.  African American gentleman with multiple medical problems, including diabetes hypertension and hyperlipidemia, right hemiplegia and  Dysarthria due to remote stroke, history of recurrent diarrhea, history of internal hemorrhoids, that is post colonoscopy 1 year ago demonstrating  tubulovillous adenoma, has been having bloody diarrhea for 3 days now. No associated fever nausea or vomiting, but eventually brought to the emergency room and a CT scan of the abdomen revealed evidence of proctosigmoiditis, and the hospitalist service was called to assist.  Symptoms are associated with general abdominal pain.  He is cared for at home by his wife and usually has severe baseline status, and is mobile by wheelchair or cane.  Rewiew of Systems:  The patient denies anorexia, fever, weight loss,, vision loss, decreased hearing, hoarseness, chest pain, syncope, dyspnea on exertion,  balance deficits, hemoptysis, , melena,, severe indigestion/heartburn, hematuria, incontinence, genital sores, muscle weakness, suspicious skin lesions, transient blindness,  depression, unusual weight change, abnormal bleeding, enlarged lymph nodes, angioedema, and breast masses.    Past Medical History  Diagnosis Date  . Barrett's esophagus     EGD 03/23/2011 & EGD 2/09 bx proven  . Steatohepatitis   . DM (diabetes mellitus)   . Diverticulosis     TCS 03/23/11 pancolonic diverticula &TCS 5/08, pancolonic diverticula  . GERD (gastroesophageal reflux disease)   . HTN (hypertension)   . CVA (cerebral infarction) 1998    right sided deficit  . Gout   . Lymphoma     XRT at [facility]  . Hepatitis     esosiniphilic, tx with prednisone  . Stroke     1998  . Renal insufficiency   . 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 Hiatal hernia   . Colon polyp 03/23/2011    tubular adenoma, Dr. Gala Romney    . Hemorrhoids, internal 03/23/2011    tcs by Dr. Gala Romney  . Hyperlipidemia   . Lymphoma     Past Surgical History  Procedure Date  . Cholecystectomy   . Right lymph node removal   . Right video-assisted thoracic surgery, pleurectomy, and pleurodesis 2011  . Esophagogastroduodenoscopy 02/05/08    goblet cell metaplasia/negative for H.pylori  . Colonoscopy 03/23/11    Dr. Gala Romney  pancolonic diverticula, hemorrhoids, tubular adenoma.  . Esophagogastroduodenoscopy 03/23/11    Dr. Gala Romney, barretts, hiatal hernia    Medications:  HOME MEDS: Prior to Admission medications   Medication Sig Start Date End Date Taking? Authorizing Provider  allopurinol (ZYLOPRIM) 300 MG tablet Take 300 mg by mouth daily.     Yes Historical Provider, MD  amLODipine (NORVASC) 10 MG tablet Take 10 mg by mouth daily.     Yes Historical Provider, MD  cloNIDine (CATAPRES) 0.2 MG tablet Take 1 tablet (0.2 mg total) by mouth 3 (three) times daily. 01/11/12  Yes [name], MD  febuxostat (ULORIC) 40 MG tablet Take 1 tablet (40 mg total) by mouth daily. 01/11/12  Yes [name], MD  gabapentin (NEURONTIN) 300 MG capsule Take 300-600 mg by mouth 3 (three) times daily. 1 tablet twice a day and 2 tablets at bedtime 04/25/12  Yes [name], MD  hydrochlorothiazide 25 MG tablet Take 25 mg by mouth daily.     Yes Historical Provider, MD  insulin glargine (LANTUS) 100 UNIT/ML injection Inject 60 Units into the skin at  bedtime.    Yes Historical Provider, MD  insulin lispro (HUMALOG) 100 UNIT/ML injection Inject 14-17 Units into the skin 3 (three) times daily before meals. Uses according to sliding scale at home.   Yes Historical Provider, MD  linagliptin (TRADJENTA) 5 MG TABS tablet Take 5 mg by mouth daily.     Yes Historical Provider, MD  lisinopril (PRINIVIL,ZESTRIL) 20 MG tablet Take 20 mg by mouth daily.     Yes Historical Provider, MD  metoprolol succinate (TOPROL-XL) 50 MG 24 hr tablet Take 50 mg by mouth  daily. Take with or immediately following a meal.   Yes Historical Provider, MD  omeprazole (PRILOSEC) 40 MG capsule Take 40 mg by mouth daily.   Yes Historical Provider, MD  simvastatin (ZOCOR) 20 MG tablet Take 1 tablet (20 mg total) by mouth at bedtime. 01/11/12  Yes [name], MD  Tamsulosin HCl (FLOMAX) 0.4 MG CAPS Take 0.4 mg by mouth daily after breakfast.     Yes Historical Provider, MD     Allergies:  No Known Allergies  Social History:   reports that he quit smoking about 15 years ago. His smoking use included Cigarettes. He smoked 3 packs per day. He has never used smokeless tobacco. He reports that he does not drink alcohol or use illicit drugs.  Family History: Family History  Problem Relation Age of Onset  . Heart failure Father   . Heart failure Mother   . Heart failure Sister   . Heart failure Son      Physical Exam: Filed Vitals:   06/06/12 1736 06/06/12 2020 06/06/12 2140 06/06/12 2258  BP: 135/92 144/91 141/88 141/86  Pulse: 93 100 100 88  Temp:  99 F (37.2 C) 98 F (36.7 C) 99.3 F (37.4 C)  TempSrc:  Oral Oral Oral  Resp: 20 16 16 18   Height:      Weight:    121.1 kg (266 lb 15.6 oz)  SpO2: 96% 94% 95% 96%   Blood pressure 141/86, pulse 88, temperature 99.3 F (37.4 C), temperature source Oral, resp. rate 18, height 6' 1"  (1.854 m), weight 121.1 kg (266 lb 15.6 oz), SpO2 96.00%.  GEN:  Pleasant elderly African American gentleman lying in the stretcher in no acute distress; cooperative with exam PSYCH:  alert and oriented; does not appear anxious or depressed; affect is appropriate. HEENT: Mucous membranes pink and anicteric; PERRLA; EOM intact; no cervical lymphadenopathy nor thyromegaly or carotid bruit; no JVD; Breasts:: Not examined CHEST WALL: No tenderness CHEST: Normal respiration, clear to auscultation bilaterally HEART: Regular rate and rhythm; no murmurs rubs or gallops BACK:  no CVA tenderness ABDOMEN: Obese, diffuse mild  tenderness r; no masses, no organomegaly, normal abdominal bowel sounds; no intertriginous candida. Rectal Exam: Not done EXTREMITIES:  age-appropriate arthropathy of the hands and knees; trace edema; no ulcerations. Genitalia: not examined PULSES: 2+ and symmetric SKIN: Normal hydration no rash or ulceration CNS: Cranial nerves 2-12 grossly intact no focal lateralizing neurologic deficit   Labs & Imaging Results for orders placed during the hospital encounter of 06/06/12 (from the past 48 hour(s))  CBC WITH DIFFERENTIAL     Status: Abnormal   Collection Time   06/06/12  3:00 PM      Component Value Range Comment   WBC 11.0 (*) 4.0 - 10.5 K/uL    RBC 6.32 (*) 4.22 - 5.81 MIL/uL    Hemoglobin 15.3  13.0 - 17.0 g/dL    HCT 48.1  39.0 -  52.0 %    MCV 76.1 (*) 78.0 - 100.0 fL    MCH 24.2 (*) 26.0 - 34.0 pg    MCHC 31.8  30.0 - 36.0 g/dL    RDW 18.0 (*) 11.5 - 15.5 %    Platelets    150 - 400 K/uL PLATELET CLUMPING, SUGGEST RECOLLECTION OF SAMPLE IN CITRATE TUBE.   Value: PLATELET CLUMPS NOTED ON SMEAR, COUNT APPEARS ADEQUATE   Neutrophils Relative 80 (*) 43 - 77 %    Lymphocytes Relative 8 (*) 12 - 46 %    Monocytes Relative 7  3 - 12 %    Eosinophils Relative 5  0 - 5 %    Basophils Relative 0  0 - 1 %    Neutro Abs 8.7 (*) 1.7 - 7.7 K/uL    Lymphs Abs 0.9  0.7 - 4.0 K/uL    Monocytes Absolute 0.8  0.1 - 1.0 K/uL    Eosinophils Absolute 0.6  0.0 - 0.7 K/uL    Basophils Absolute 0.0  0.0 - 0.1 K/uL   COMPREHENSIVE METABOLIC PANEL     Status: Abnormal   Collection Time   06/06/12  3:00 PM      Component Value Range Comment   Sodium 134 (*) 135 - 145 mEq/L    Potassium 4.8  3.5 - 5.1 mEq/L MODERATE HEMOLYSIS   Chloride 101  96 - 112 mEq/L    CO2 19  19 - 32 mEq/L    Glucose, Bld 121 (*) 70 - 99 mg/dL    BUN 22  6 - 23 mg/dL    Creatinine, Ser 1.16  0.50 - 1.35 mg/dL    Calcium 10.7 (*) 8.4 - 10.5 mg/dL    Total Protein 7.6  6.0 - 8.3 g/dL    Albumin 3.6  3.5 - 5.2 g/dL    AST  33  0 - 37 U/L MODERATE HEMOLYSIS   ALT 15  0 - 53 U/L MODERATE HEMOLYSIS   Alkaline Phosphatase 70  39 - 117 U/L MODERATE HEMOLYSIS   Total Bilirubin 0.3  0.3 - 1.2 mg/dL    GFR calc non Af Amer 64 (*) >90 mL/min    GFR calc Af Amer 75 (*) >90 mL/min   LIPASE, BLOOD     Status: Normal   Collection Time   06/06/12  3:00 PM      Component Value Range Comment   Lipase 19  11 - 59 U/L   GLUCOSE, CAPILLARY     Status: Abnormal   Collection Time   06/06/12  5:43 PM      Component Value Range Comment   Glucose-Capillary 124 (*) 70 - 99 mg/dL   CLOSTRIDIUM DIFFICILE BY PCR     Status: Normal   Collection Time   06/06/12  9:57 PM      Component Value Range Comment   C difficile by pcr NEGATIVE  NEGATIVE    Ct Abdomen Pelvis W Contrast  06/06/2012  *RADIOLOGY REPORT*  Clinical Data: Abdominal pain  CT ABDOMEN AND PELVIS WITH CONTRAST  Technique:  Multidetector CT imaging of the abdomen and pelvis was performed following the standard protocol during bolus administration of intravenous contrast.  Contrast: 125m OMNIPAQUE IOHEXOL 300 MG/ML  SOLN  Comparison: 04/29/2012  Findings: Tiny right pleural effusion.  Small left pleural effusion.  Dependent atelectasis.  Cysts in the left lung have a chronic appearance.  Healing right-sided rib fractures with deformity.  No pneumothorax.  Small amount of pericardial fluid  is likely physiologic.  Post cholecystectomy.  Liver, pancreas, spleen, adrenal glands stable.  Renal cysts are stable.  There is wall thickening of the sigmoid colon and rectum which is mild.  This is associated with diverticulosis.  Little if any stranding in the adjacent fat.  No extraluminal bowel gas.  No abscess.  Small umbilical hernia containing adipose tissue.  Normal appendix.  Bladder and prostate are unremarkable.  Stable mixed sclerotic and lytic changes of L2 with an inferior endplate deformity.  IMPRESSION: Wall thickening of the sigmoid colon and rectum worrisome for inflammatory  process.  Consider diverticulitis, infectious colitis, inflammatory bowel disease.  Small left pleural effusion.  Tiny right pleural effusion.  Otherwise stable study.  Original Report Authenticated By: Jamas Lav, M.D.   Dg Abd Acute W/chest  06/06/2012  *RADIOLOGY REPORT*  Clinical Data: Chest pain.  Abdominal cramping.  Diarrhea.  Back pain.  Shortness of breath.  Dysuria.  ACUTE ABDOMEN SERIES (ABDOMEN 2 VIEW & CHEST 1 VIEW)  Comparison: 04/29/2012  Findings: Tortuous thoracic aorta noted.  Emphysema is present with rib deformities related to remote rib fractures, and mild chronic interstitial accentuation.  Wedge resection clips noted in the right lung.  No free intraperitoneal gas is observed beneath the hemidiaphragms. No abnormal air-fluid levels. Technical factors related to patient body habitus reduce diagnostic sensitivity and specificity.  No dilated bowel observed.  IMPRESSION:  1.  Unremarkable bowel gas pattern. 2.  Emphysema with chronic interstitial accentuation. 3.  Tortuous thoracic aorta.  Original Report Authenticated By: Carron Curie, M.D.      Assessment Present on Admission:  .Colitis, infectious versus inflammatory .Lower GI bleed  .DM .HYPERTENSION .DIVERTICULOSIS OF COLON  .Gout, unspecified   PLAN: We'll admit this gentleman and manage 4  presumed infectious colitis, and will consult the GI service pending C. difficile evaluation.  Continue management of diabetes but since he'll be on a liquid diet initially, reduce the dose of his Lantus and give sliding scale.  Other plans as per orders.   Leightyn Cina 06/06/2012, 11:26 PM

## 2012-06-07 ENCOUNTER — Encounter (HOSPITAL_COMMUNITY): Payer: Self-pay | Admitting: Gastroenterology

## 2012-06-07 DIAGNOSIS — A09 Infectious gastroenteritis and colitis, unspecified: Secondary | ICD-10-CM

## 2012-06-07 DIAGNOSIS — E1165 Type 2 diabetes mellitus with hyperglycemia: Secondary | ICD-10-CM

## 2012-06-07 DIAGNOSIS — E119 Type 2 diabetes mellitus without complications: Secondary | ICD-10-CM

## 2012-06-07 DIAGNOSIS — K922 Gastrointestinal hemorrhage, unspecified: Secondary | ICD-10-CM

## 2012-06-07 LAB — CBC
HCT: 42.1 % (ref 39.0–52.0)
MCH: 23.9 pg — ABNORMAL LOW (ref 26.0–34.0)
MCHC: 32.3 g/dL (ref 30.0–36.0)
MCV: 73.9 fL — ABNORMAL LOW (ref 78.0–100.0)
Platelets: 207 10*3/uL (ref 150–400)
RDW: 17.5 % — ABNORMAL HIGH (ref 11.5–15.5)

## 2012-06-07 LAB — GLUCOSE, CAPILLARY
Glucose-Capillary: 145 mg/dL — ABNORMAL HIGH (ref 70–99)
Glucose-Capillary: 135 mg/dL — ABNORMAL HIGH (ref 70–99)

## 2012-06-07 LAB — BASIC METABOLIC PANEL
BUN: 17 mg/dL (ref 6–23)
CO2: 24 mEq/L (ref 19–32)
Calcium: 10.1 mg/dL (ref 8.4–10.5)
Chloride: 103 mEq/L (ref 96–112)
Creatinine, Ser: 1.02 mg/dL (ref 0.50–1.35)
Glucose, Bld: 168 mg/dL — ABNORMAL HIGH (ref 70–99)

## 2012-06-07 LAB — APTT: aPTT: 31 seconds (ref 24–37)

## 2012-06-07 LAB — MAGNESIUM: Magnesium: 1.5 mg/dL (ref 1.5–2.5)

## 2012-06-07 IMAGING — CT CT ABD-PELV W/ CM
2 of 4 series · 17 of 46 positions shown, 19 images · IV contrast (Omnipaque 300)
Comparison: CT abdomen 05/08/2011.

CLINICAL DATA: Right-sided abdominal pain.

CT ABDOMEN AND PELVIS WITH CONTRAST
TECHNIQUE: Multidetector CT imaging of the abdomen and pelvis was
performed following the standard protocol during bolus
administration of intravenous contrast.
Contrast: 80mL OMNIPAQUE IOHEXOL 300 MG/ML IV SOLN 80 ml Omnipaque-
300

[Series 2: abd_pel_with 5.0 b40f · axial · 0.92mm/px · z∈[-402,+78]mm · 14 of 106 slices shown, 16 images]
[im 5/106  soft-tissue]
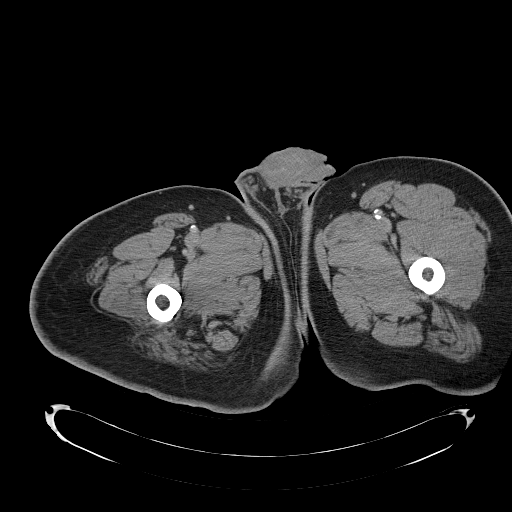
[im 5/106  bone]
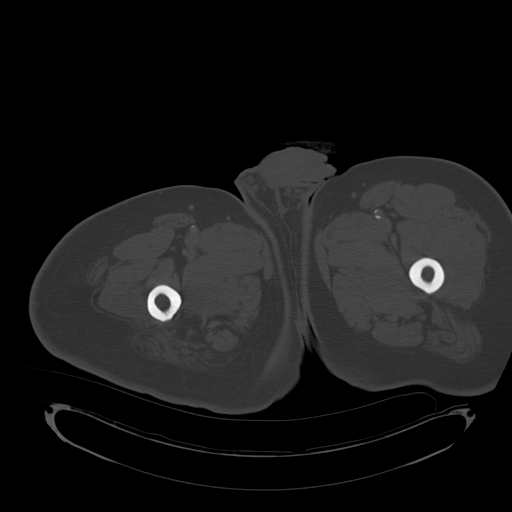
[im 15/106  soft-tissue]
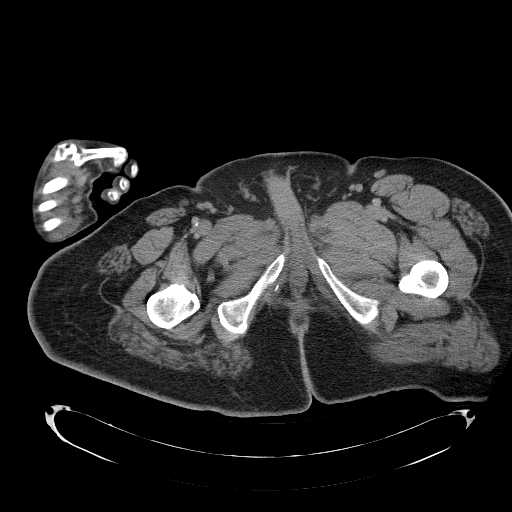
[im 20/106  soft-tissue]
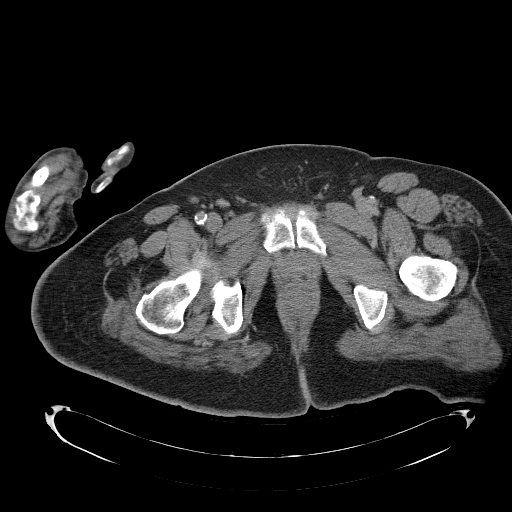
[im 29/106  soft-tissue]
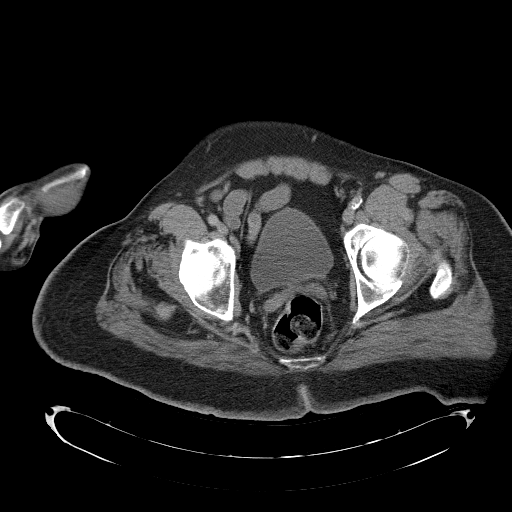
[im 34/106  soft-tissue]
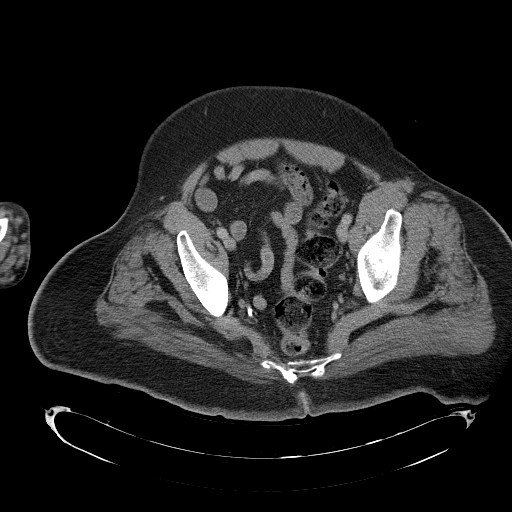
[im 43/106  soft-tissue]
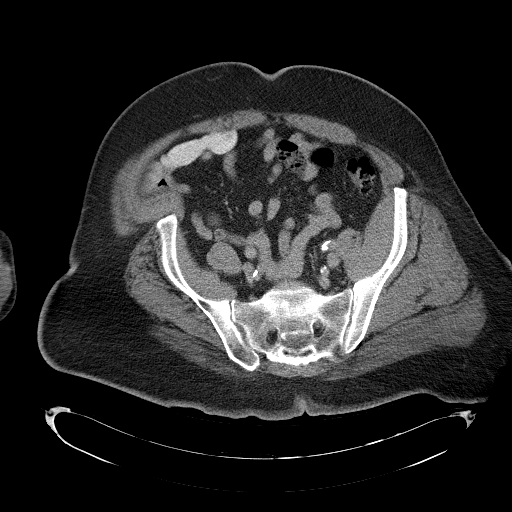
[im 48/106  soft-tissue]
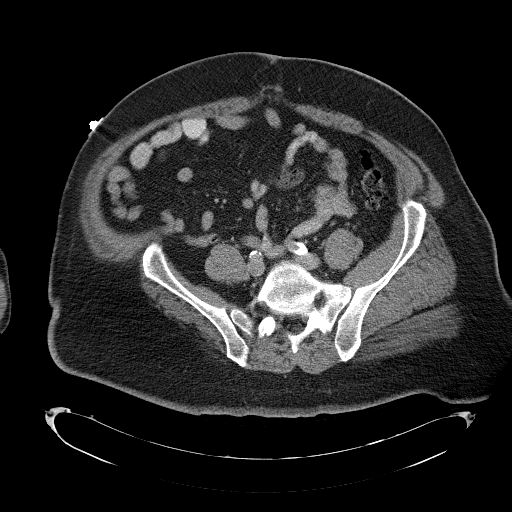
[im 58/106  soft-tissue]
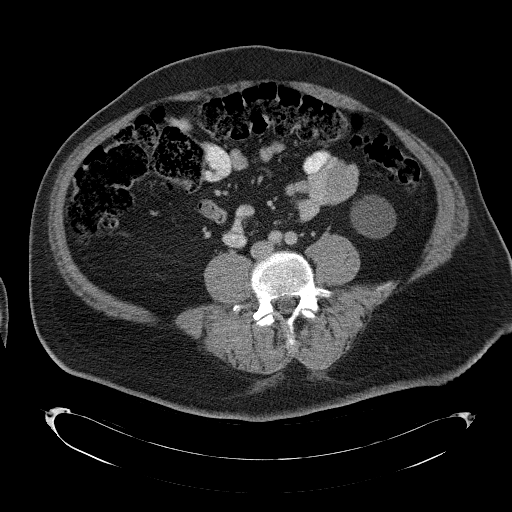
[im 63/106  soft-tissue]
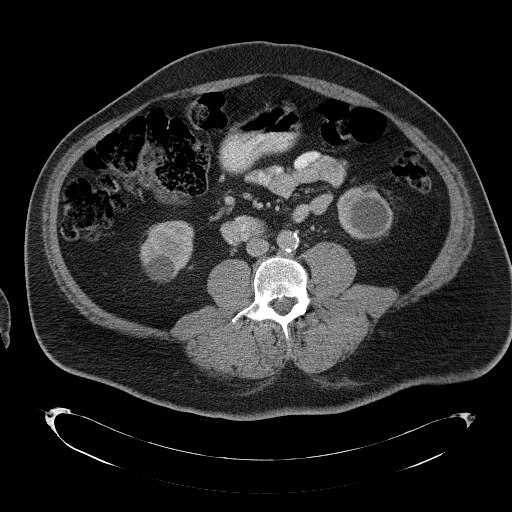
[im 63/106  bone]
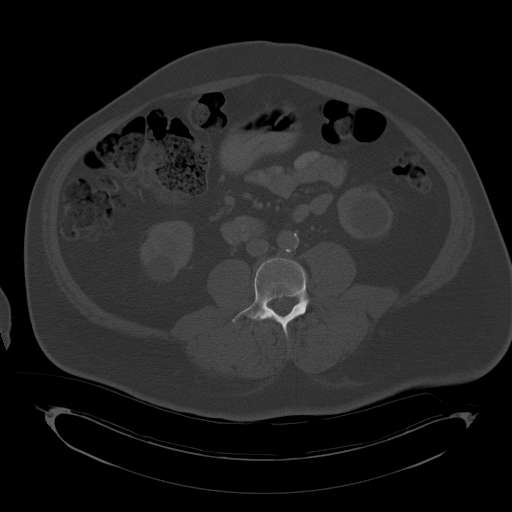
[im 72/106  soft-tissue]
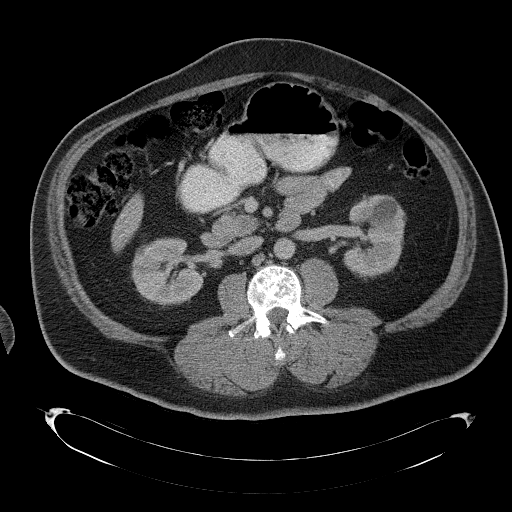
[im 77/106  soft-tissue]
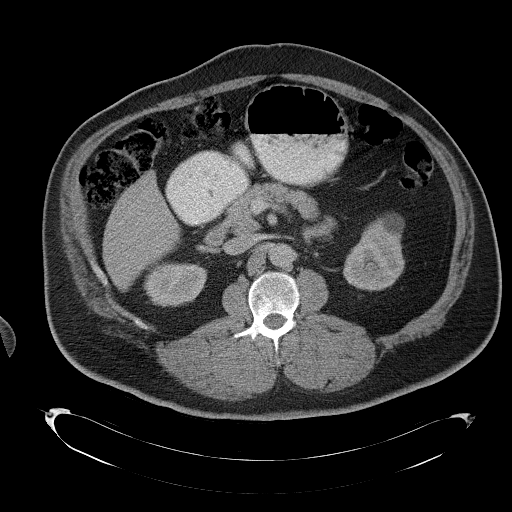
[im 86/106  soft-tissue]
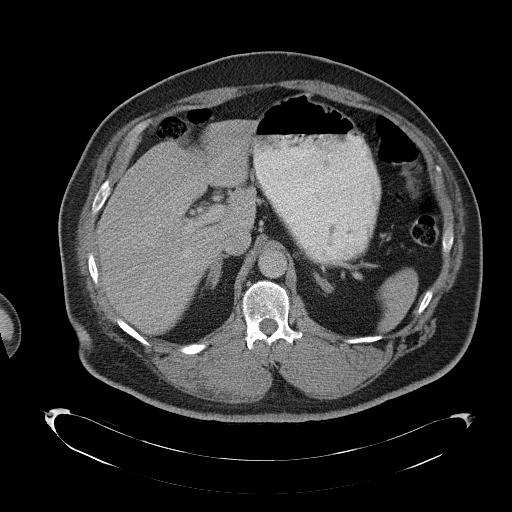
[im 91/106  soft-tissue]
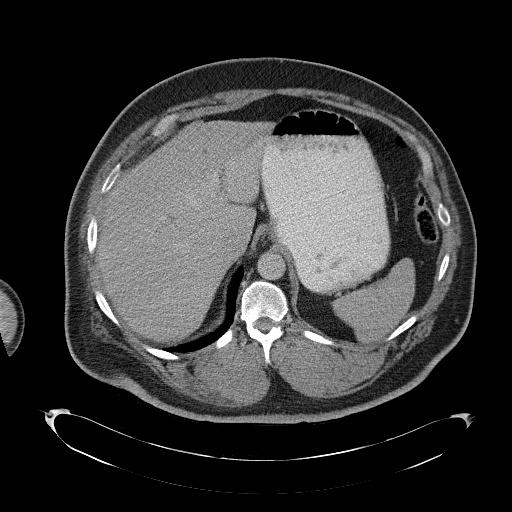
[im 101/106  soft-tissue]
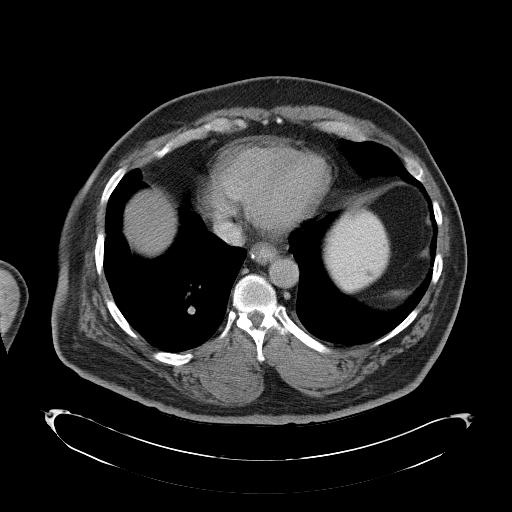

[Series 4: abd_pel_with 3.0 spo · coronal · 0.91mm/px · 3 of 99 slices shown]
[im 33/99  soft-tissue]
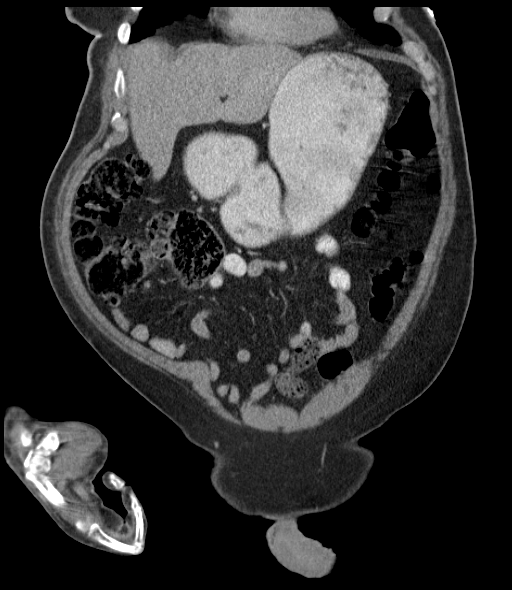
[im 44/99  soft-tissue]
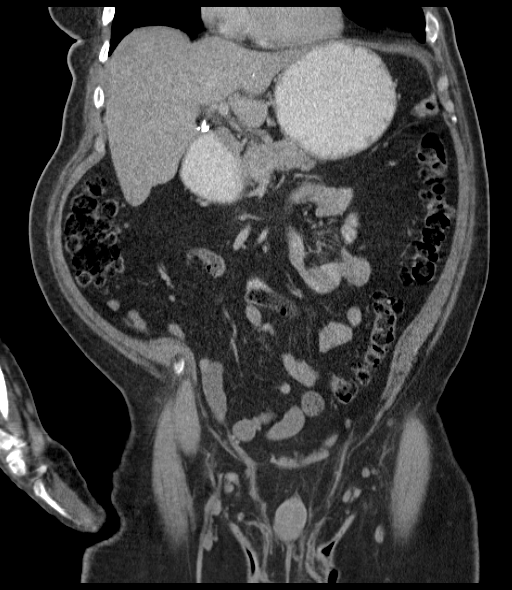
[im 55/99  soft-tissue]
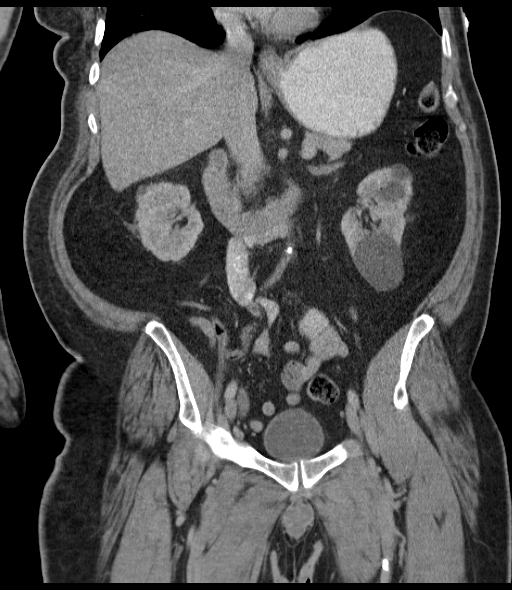

[17 of 46 positions shown; findings below may reference images not displayed]

FINDINGS: The lung bases are clear.  No pleural effusion.

The solid abdominal organs are intact and appear stable.  Bilateral
renal cysts are noted.  The gallbladder is surgically absent.  No
common bile duct dilatation.

The stomach, duodenum, small bowel and colon are unremarkable.
There is scattered colonic diverticulosis and a moderate amount of
stool.  No findings for mass lesion or inflammatory change.  The
appendix is normal.

No mesenteric or retroperitoneal masses or lymphadenopathy.  The
aorta is normal in caliber.  The major branch vessels are normal.

The bladder, prostate gland and seminal vesicles are unremarkable
and stable.  No pelvic mass or adenopathy.

The bony structures are intact.
IMPRESSION: No acute abdominal/pelvic findings, mass lesions or adenopathy.

## 2012-06-07 MED ORDER — ACETAMINOPHEN 650 MG RE SUPP: 650.0000 mg | Freq: Four times a day (QID) | RECTAL | Status: DC | PRN

## 2012-06-07 MED ORDER — PANTOPRAZOLE SODIUM 40 MG PO TBEC
80.0000 mg | DELAYED_RELEASE_TABLET | Freq: Every day | ORAL | Status: DC
  Administered 2012-06-07 – 2012-06-09 (×3): 80 mg via ORAL
  Filled 2012-06-07 (×2): qty 2
  Filled 2012-06-07: qty 1

## 2012-06-07 MED ORDER — LISINOPRIL 10 MG PO TABS
20.0000 mg | ORAL_TABLET | Freq: Every day | ORAL | Status: DC
Start: 2012-06-07 — End: 2012-06-07

## 2012-06-07 MED ORDER — CLONIDINE HCL 0.2 MG PO TABS
0.2000 mg | ORAL_TABLET | Freq: Three times a day (TID) | ORAL | Status: DC
  Administered 2012-06-07 – 2012-06-08 (×7): 0.2 mg via ORAL
  Filled 2012-06-07 (×8): qty 1

## 2012-06-07 MED ORDER — GABAPENTIN 300 MG PO CAPS
300.0000 mg | ORAL_CAPSULE | Freq: Two times a day (BID) | ORAL | Status: DC
  Administered 2012-06-07 – 2012-06-09 (×5): 300 mg via ORAL
  Filled 2012-06-07 (×5): qty 1

## 2012-06-07 MED ORDER — ONDANSETRON HCL 4 MG PO TABS: 4.0000 mg | ORAL_TABLET | ORAL | Status: DC | PRN

## 2012-06-07 MED ORDER — SIMVASTATIN 20 MG PO TABS
20.0000 mg | ORAL_TABLET | Freq: Every day | ORAL | Status: DC
  Administered 2012-06-07 – 2012-06-08 (×2): 20 mg via ORAL
  Filled 2012-06-07 (×3): qty 1

## 2012-06-07 MED ORDER — CIPROFLOXACIN IN D5W 400 MG/200ML IV SOLN
400.0000 mg | Freq: Two times a day (BID) | INTRAVENOUS | Status: DC
  Administered 2012-06-07 – 2012-06-08 (×2): 400 mg via INTRAVENOUS
  Filled 2012-06-07 (×3): qty 200

## 2012-06-07 MED ORDER — AMLODIPINE BESYLATE 5 MG PO TABS: 10.0000 mg | ORAL_TABLET | Freq: Every day | ORAL | Status: DC

## 2012-06-07 MED ORDER — HYDROMORPHONE HCL PF 1 MG/ML IJ SOLN
0.5000 mg | INTRAMUSCULAR | Status: DC | PRN
  Administered 2012-06-07: 0.5 mg via INTRAVENOUS
  Filled 2012-06-07: qty 1

## 2012-06-07 MED ORDER — ACETAMINOPHEN 325 MG PO TABS
650.0000 mg | ORAL_TABLET | Freq: Four times a day (QID) | ORAL | Status: DC | PRN
  Administered 2012-06-07: 650 mg via ORAL
  Filled 2012-06-07: qty 2

## 2012-06-07 MED ORDER — METRONIDAZOLE IN NACL 5-0.79 MG/ML-% IV SOLN
INTRAVENOUS | Status: AC
  Filled 2012-06-07: qty 100

## 2012-06-07 MED ORDER — OXYCODONE HCL 5 MG PO TABS
5.0000 mg | ORAL_TABLET | ORAL | Status: DC | PRN
  Administered 2012-06-08: 5 mg via ORAL
  Filled 2012-06-07 (×2): qty 1

## 2012-06-07 MED ORDER — ONDANSETRON HCL 4 MG/2ML IJ SOLN: 4.0000 mg | Freq: Four times a day (QID) | INTRAMUSCULAR | Status: DC | PRN

## 2012-06-07 MED ORDER — METRONIDAZOLE 500 MG PO TABS
500.0000 mg | ORAL_TABLET | Freq: Three times a day (TID) | ORAL | Status: DC
  Administered 2012-06-07 – 2012-06-09 (×7): 500 mg via ORAL
  Filled 2012-06-07 (×7): qty 1

## 2012-06-07 MED ORDER — GABAPENTIN 300 MG PO CAPS: 300.0000 mg | ORAL_CAPSULE | Freq: Three times a day (TID) | ORAL | Status: DC

## 2012-06-07 MED ORDER — METOPROLOL SUCCINATE ER 50 MG PO TB24
50.0000 mg | ORAL_TABLET | Freq: Every day | ORAL | Status: DC
  Administered 2012-06-07 – 2012-06-09 (×3): 50 mg via ORAL
  Filled 2012-06-07 (×3): qty 1

## 2012-06-07 MED ORDER — AMLODIPINE BESYLATE 5 MG PO TABS
5.0000 mg | ORAL_TABLET | Freq: Every day | ORAL | Status: DC
  Administered 2012-06-07 – 2012-06-09 (×3): 5 mg via ORAL
  Filled 2012-06-07 (×3): qty 1

## 2012-06-07 MED ORDER — FEBUXOSTAT 40 MG PO TABS
40.0000 mg | ORAL_TABLET | Freq: Every day | ORAL | Status: DC
  Administered 2012-06-07 – 2012-06-09 (×3): 40 mg via ORAL
  Filled 2012-06-07 (×5): qty 1

## 2012-06-07 MED ORDER — POTASSIUM CHLORIDE IN NACL 20-0.9 MEQ/L-% IV SOLN
INTRAVENOUS | Status: DC
  Administered 2012-06-07 – 2012-06-09 (×4): via INTRAVENOUS

## 2012-06-07 MED ORDER — METRONIDAZOLE IN NACL 5-0.79 MG/ML-% IV SOLN
500.0000 mg | Freq: Once | INTRAVENOUS | Status: AC
  Administered 2012-06-07: 500 mg via INTRAVENOUS
  Filled 2012-06-07: qty 100

## 2012-06-07 MED ORDER — TAMSULOSIN HCL 0.4 MG PO CAPS
0.4000 mg | ORAL_CAPSULE | Freq: Every day | ORAL | Status: DC
  Administered 2012-06-07 – 2012-06-09 (×3): 0.4 mg via ORAL
  Filled 2012-06-07 (×3): qty 1

## 2012-06-07 MED ORDER — TRAZODONE HCL 50 MG PO TABS: 25.0000 mg | ORAL_TABLET | Freq: Every evening | ORAL | Status: DC | PRN

## 2012-06-07 MED ORDER — GABAPENTIN 300 MG PO CAPS
600.0000 mg | ORAL_CAPSULE | Freq: Every day | ORAL | Status: DC
  Administered 2012-06-07 – 2012-06-08 (×3): 600 mg via ORAL
  Filled 2012-06-07 (×3): qty 2

## 2012-06-07 MED ORDER — ALLOPURINOL 300 MG PO TABS
300.0000 mg | ORAL_TABLET | Freq: Every day | ORAL | Status: DC
  Administered 2012-06-07 – 2012-06-09 (×3): 300 mg via ORAL
  Filled 2012-06-07 (×3): qty 1

## 2012-06-07 MED ORDER — LISINOPRIL 10 MG PO TABS
10.0000 mg | ORAL_TABLET | Freq: Every day | ORAL | Status: DC
  Administered 2012-06-07 – 2012-06-09 (×3): 10 mg via ORAL
  Filled 2012-06-07 (×3): qty 1

## 2012-06-07 MED ORDER — INSULIN GLARGINE 100 UNIT/ML ~~LOC~~ SOLN
30.0000 [IU] | Freq: Every day | SUBCUTANEOUS | Status: DC
Start: 2012-06-07 — End: 2012-06-08
  Administered 2012-06-07 (×2): 30 [IU] via SUBCUTANEOUS

## 2012-06-07 NOTE — Care Management Note (Unsigned)
    Page 1 of 1   06/07/2012     11:25:37 AM   CARE MANAGEMENT NOTE 06/07/2012  Patient:  [name], [name]   Account Number:  [id number]  Date Initiated:  06/07/2012  Documentation initiated by:  Claretha Cooper  Subjective/Objective Assessment:   Pt admitted from home where he lives with his wife. Speech impaired but did state he has had HH in the past. CM looked back in [place] and previously had [facility].     Action/Plan:   Will follow up with pt and spouse to see if they would choose [facility] again if [facility] needs identified.   Anticipated DC Date:  06/10/2012   Anticipated DC Plan:  [place]  CM consult      Choice offered to / List presented to:             Status of service:  In process, will continue to follow Medicare Important Message given?   (If response is "NO", the following Medicare IM given date fields will be blank) Date Medicare IM given:   Date Additional Medicare IM given:    Discharge Disposition:    Per UR Regulation:    If discussed at Long Length of Stay Meetings, dates discussed:    Comments:  06/07/12 Brent

## 2012-06-07 NOTE — Clinical Social Work Note (Signed)
Began advance directives with patient.  Patient did not have contact information for son, Neal Trulson, who he wanted to be POA.  Asked that I contact his wife to get this information.  Process will be continued when information is available.  Justice Britain Clinical Social Worker [phone])

## 2012-06-07 NOTE — Consult Note (Signed)
Referring Provider: Rexene Alberts, MD Primary Care Physician:  Vic Blackbird, MD Primary Gastroenterologist:  Garfield Cornea, MD  Reason for Consultation:  colitis  HPI: [name] is a 66 y.o. male admitted with colitis. He is well-known to our practice for extensive GI evaluation in the past. Last seen in 10/2011. 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   He presents with bloody diarrhea for 3 days duration. Symptoms started over the weekend per patient. Started have bloody diarrhea, described as loose stool with moderate volume fresh blood. C/O diffuse abdominal pain. Reports normal stools prior to this weekend. No ill contacts. He denies recent antibiotics or travel. States diarrhea better since admission. Prior to admission, TNTC. No n/v, fever.   CT revealed wall thickening of the sigmoid colon and rectum, mild. DDX includes diverticulitis, infectious colitis, IBD. WBC 11000 on admission. CDIFF PCR is negative. GI pathogen panel by PCR pending. Currently on IV cipro and PO flagyl. Patient takes ASA daily at home but denies other NSAIDS.    Prior to Admission medications   Medication Sig Start Date End Date Taking? Authorizing Provider  allopurinol (ZYLOPRIM) 300 MG tablet Take 300 mg by mouth daily.     Yes Historical Provider, MD  amLODipine (NORVASC) 10 MG tablet Take 10 mg by mouth daily.     Yes Historical Provider, MD  cloNIDine (CATAPRES) 0.2 MG tablet Take 1 tablet (0.2 mg total) by mouth 3 (three) times daily. 01/11/12  Yes [name], MD  febuxostat (ULORIC) 40 MG tablet Take 1 tablet (40 mg total) by mouth daily. 01/11/12  Yes [name], MD  gabapentin (NEURONTIN) 300 MG capsule Take 300-600 mg by mouth 3 (three) times daily. 1 tablet twice a day and 2 tablets at bedtime 04/25/12   Yes [name], MD  hydrochlorothiazide 25 MG tablet Take 25 mg by mouth daily.     Yes Historical Provider, MD  insulin glargine (LANTUS) 100 UNIT/ML injection Inject 60 Units into the skin at bedtime.    Yes Historical Provider, MD  insulin lispro (HUMALOG) 100 UNIT/ML injection Inject 14-17 Units into the skin 3 (three) times daily before meals. Uses according to sliding scale at home.   Yes Historical Provider, MD  linagliptin (TRADJENTA) 5 MG TABS tablet Take 5 mg by mouth daily.     Yes Historical Provider, MD  lisinopril (PRINIVIL,ZESTRIL) 20 MG tablet Take 20 mg by mouth daily.     Yes Historical Provider, MD  metoprolol succinate (TOPROL-XL) 50 MG 24 hr tablet Take 50 mg by mouth daily. Take with or immediately following a meal.   Yes Historical Provider, MD  omeprazole (PRILOSEC) 40 MG capsule Take 40 mg by mouth daily.   Yes Historical Provider, MD  simvastatin (ZOCOR) 20 MG tablet Take 1 tablet (20 mg total) by mouth at bedtime. 01/11/12  Yes [name], MD  Tamsulosin HCl (FLOMAX) 0.4 MG CAPS Take 0.4 mg by mouth daily after breakfast.     Yes Historical Provider, MD    Current Facility-Administered Medications  Medication Dose Route Frequency Provider Last Rate Last Dose  . 0.9 % NaCl with KCl 20 mEq/ L  infusion   Intravenous Continuous Karlyn Agee, MD 125 mL/hr at 06/07/12 0108    . acetaminophen (TYLENOL) tablet 650 mg  650 mg Oral Q6H PRN Karlyn Agee, MD  Or  . acetaminophen (TYLENOL) suppository 650 mg  650 mg Rectal Q6H PRN Karlyn Agee, MD      . allopurinol (ZYLOPRIM) tablet 300 mg  300 mg Oral Daily Karlyn Agee, MD      . amLODipine (NORVASC) tablet 10 mg  10 mg Oral Daily Karlyn Agee, MD      . ciprofloxacin (CIPRO) IVPB 400 mg  400 mg Intravenous Once Mervin Kung, MD 200 mL/hr at 06/06/12 2107 400 mg at 06/06/12 2107  . ciprofloxacin (CIPRO) IVPB 400 mg  400 mg Intravenous Q12H Karlyn Agee, MD      . cloNIDine  (CATAPRES) tablet 0.2 mg  0.2 mg Oral TID Karlyn Agee, MD   0.2 mg at 06/07/12 0108  . febuxostat (ULORIC) tablet 40 mg  40 mg Oral Daily Karlyn Agee, MD      . gabapentin (NEURONTIN) capsule 300 mg  300 mg Oral BID Karlyn Agee, MD      . gabapentin (NEURONTIN) capsule 600 mg  600 mg Oral QHS Karlyn Agee, MD   600 mg at 06/07/12 0109  . HYDROmorphone (DILAUDID) injection 0.5 mg  0.5 mg Intravenous Once Mervin Kung, MD   0.5 mg at 06/06/12 1527  . HYDROmorphone (DILAUDID) injection 0.5 mg  0.5 mg Intravenous Once Mervin Kung, MD   0.5 mg at 06/06/12 1746  . HYDROmorphone (DILAUDID) injection 0.5 mg  0.5 mg Intravenous Q2H PRN Karlyn Agee, MD   0.5 mg at 06/07/12 0124  . insulin aspart (novoLOG) injection 0-15 Units  0-15 Units Subcutaneous TID WC Karlyn Agee, MD      . insulin aspart (novoLOG) injection 0-5 Units  0-5 Units Subcutaneous QHS Karlyn Agee, MD      . insulin glargine (LANTUS) injection 30 Units  30 Units Subcutaneous QHS Karlyn Agee, MD   30 Units at 06/07/12 0109  . lisinopril (PRINIVIL,ZESTRIL) tablet 20 mg  20 mg Oral Daily Karlyn Agee, MD      . metoprolol succinate (TOPROL-XL) 24 hr tablet 50 mg  50 mg Oral Daily Karlyn Agee, MD      . metroNIDAZOLE (FLAGYL) IVPB 500 mg  500 mg Intravenous Once Karlyn Agee, MD   500 mg at 06/07/12 0109  . metroNIDAZOLE (FLAGYL) tablet 500 mg  500 mg Oral Q8H Karlyn Agee, MD      . ondansetron [facility]) injection 4 mg  4 mg Intravenous Once Mervin Kung, MD   4 mg at 06/06/12 1525  . ondansetron (ZOFRAN) injection 4 mg  4 mg Intravenous Once Mervin Kung, MD   4 mg at 06/06/12 1745  . ondansetron (ZOFRAN) tablet 4 mg  4 mg Oral Q4H PRN Karlyn Agee, MD       Or  . ondansetron [facility]) injection 4 mg  4 mg Intravenous Q6H PRN Karlyn Agee, MD      . oxyCODONE (Oxy IR/ROXICODONE) immediate release tablet 5 mg  5 mg Oral Q4H PRN Karlyn Agee, MD      .  pantoprazole (PROTONIX) EC tablet 80 mg  80 mg Oral Q1200 Karlyn Agee, MD      . simvastatin (ZOCOR) tablet 20 mg  20 mg Oral QHS Karlyn Agee, MD      . sodium chloride 0.9 % bolus 250 mL  250 mL Intravenous Once Mervin Kung, MD   250 mL at 06/06/12 1521  . Tamsulosin HCl (FLOMAX) capsule 0.4 mg  0.4 mg Oral QPC breakfast Karlyn Agee, MD      .  traZODone (DESYREL) tablet 25 mg  25 mg Oral QHS PRN Karlyn Agee, MD      . DISCONTD: 0.9 %  sodium chloride infusion   Intravenous Continuous Mervin Kung, MD      . DISCONTD: 0.9 %  sodium chloride infusion   Intravenous STAT Mervin Kung, MD      . DISCONTD: gabapentin (NEURONTIN) capsule 300-600 mg  300-600 mg Oral TID Karlyn Agee, MD      . DISCONTD: HYDROmorphone (DILAUDID) injection 1 mg  1 mg Intravenous Q4H PRN Mervin Kung, MD      . DISCONTD: metroNIDAZOLE (FLAGYL) IVPB 500 mg  500 mg Intravenous Once Mervin Kung, MD   500 mg at 06/07/12 0030  . DISCONTD: ondansetron (ZOFRAN) injection 4 mg  4 mg Intravenous Q8H PRN Mervin Kung, MD       Facility-Administered Medications Ordered in Other Encounters  Medication Dose Route Frequency Provider Last Rate Last Dose  . iohexol (OMNIPAQUE) 300 MG/ML solution 100 mL  100 mL Intravenous Once PRN Medication Radiologist, MD   100 mL at 06/06/12 1943    Allergies as of 06/06/2012  . (No Known Allergies)    Past Medical History  Diagnosis Date  . Barrett's esophagus     EGD 03/23/2011 & EGD 2/09 bx proven  . Steatohepatitis     liver biopsy 2009  . DM (diabetes mellitus)   . Diverticulosis     TCS 03/23/11 pancolonic diverticula &TCS 5/08, pancolonic diverticula  . GERD (gastroesophageal reflux disease)   . HTN (hypertension)   . CVA (cerebral infarction) 1998    right sided deficit  . Gout   . Lymphoma     XRT at [facility]  . Hepatitis     esosiniphilic, tx with prednisone  . Stroke     1998  . Renal insufficiency   . 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 Hiatal hernia   . Colon polyp 03/23/2011    tubular adenoma, Dr. Gala Romney  . Hemorrhoids, internal 03/23/2011    tcs by Dr. Gala Romney  . Hyperlipidemia   . Lymphoma     Past Surgical History  Procedure Date  . Cholecystectomy   . Right lymph node removal   . Right video-assisted thoracic surgery, pleurectomy, and pleurodesis 2011  . Esophagogastroduodenoscopy 02/05/08    goblet cell metaplasia/negative for H.pylori  . Colonoscopy 03/23/11    Dr. Gala Romney  pancolonic diverticula, hemorrhoids, tubular adenoma.. next tcs 03/2016  . Esophagogastroduodenoscopy 03/23/11    Dr. Gala Romney, barretts, hiatal hernia    Family History  Problem Relation Age of Onset  . Heart failure Father   . Heart failure Mother   . Heart failure Sister   . Heart failure Son     History   Social History  . Marital Status: Married    Spouse Name: N/A    Number of Children: 1  . Years of Education: N/A   Occupational History  . disabled    Social History Main Topics  . Smoking status: Former Smoker -- 3.0 packs/day    Types: Cigarettes    Quit date: 03/01/1997  . Smokeless tobacco: Never Used  . Alcohol Use: No  . Drug Use: No  . Sexually Active: No   Other Topics Concern  . Not on file   Social History Narrative  . No narrative on file     ROS:  General: Negative for anorexia, weight loss, fever, chills, fatigue.  Generalized weakness. Eyes: Negative for vision changes.  ENT: Negative for hoarseness, difficulty swallowing , nasal congestion. CV: Negative for chest pain, angina, palpitations, dyspnea on exertion, peripheral edema.  Respiratory: Negative for dyspnea at rest, dyspnea on exertion, cough, sputum, wheezing.  GI: See history of present illness. GU:  Negative for dysuria, hematuria, urinary incontinence, urinary frequency, nocturnal urination.  MS: Negative for joint pain, low back pain.  Derm: Negative for rash or itching.  Neuro:  Positive for weakness. Neg for  abnormal sensation, seizure, frequent headaches, memory loss, confusion.  Psych: Negative for anxiety, depression, suicidal ideation, hallucinations.  Endo: Negative for unusual weight change.  Heme: Negative for bruising or bleeding. Allergy: Negative for rash or hives.       Physical Examination: Vital signs in last 24 hours: Temp:  [98 F (36.7 C)-99.3 F (37.4 C)] 98.1 F (36.7 C) (06/27 0625) Pulse Rate:  [87-100] 87  (06/27 0625) Resp:  [16-20] 18  (06/27 0625) BP: (118-144)/(72-92) 118/83 mmHg (06/27 0625) SpO2:  [94 %-98 %] 95 % (06/27 0625) Weight:  [260 lb (117.935 kg)-266 lb 15.6 oz (121.1 kg)] 266 lb 15.6 oz (121.1 kg) (06/26 2258) Last BM Date: 06/07/12  General: Well-nourished, well-developed in no acute distress.  Head: Normocephalic, atraumatic.   Eyes: Conjunctiva pink, no icterus. Left eye does not track. Mouth: Oropharyngeal mucosa moist and pink , no lesions erythema or exudate. Neck: Supple without thyromegaly, masses, or lymphadenopathy.  Lungs: Clear to auscultation bilaterally.  Heart: Regular rate and rhythm, no murmurs rubs or gallops.  Abdomen: Bowel sounds are normal, diffuse mild tenderness, nondistended, no hepatosplenomegaly or masses, no abdominal bruits or hernia , no rebound or guarding.   Rectal: not performed. Extremities: Trace lower extremity edema. No clubbing, deformity.  Neuro: Alert and oriented x 4. Hemiparesis.  Skin: Warm and dry, no rash or jaundice.   Psych: Alert and cooperative, normal mood and affect.        Intake/Output from previous day:   Intake/Output this shift:    Lab Results: CBC  Basename 06/07/12 0610 06/06/12 1500  WBC 8.3 11.0*  HGB 13.6 15.3  HCT 42.1 48.1  MCV 73.9* 76.1*  PLT 207 PLATELET CLUMPS NOTED ON SMEAR, COUNT APPEARS ADEQUATE  EOS: 5% normal  BMET  Basename 06/07/12 0610 06/06/12 1500  NA 136 134*  K 4.0 4.8  CL 103 101  CO2 24 19  GLUCOSE 168* 121*  BUN  17 22  CREATININE 1.02 1.16  CALCIUM 10.1 10.7*   LFT  Basename 06/06/12 1500  BILITOT 0.3  BILIDIR --  IBILI --  ALKPHOS 70  AST 33  ALT 15  PROT 7.6  ALBUMIN 3.6   CDIFF PCR negative GI pathogen panel by PCR: pending  @LABLAST2 (lipase:3) PT/INR  Basename 06/07/12 0610  LABPROT 14.6  INR 1.12      Imaging Studies: Ct Abdomen Pelvis W Contrast  06/06/2012  *RADIOLOGY REPORT*  Clinical Data: Abdominal pain  CT ABDOMEN AND PELVIS WITH CONTRAST  Technique:  Multidetector CT imaging of the abdomen and pelvis was performed following the standard protocol during bolus administration of intravenous contrast.  Contrast: 129m OMNIPAQUE IOHEXOL 300 MG/ML  SOLN  Comparison: 04/29/2012  Findings: Tiny right pleural effusion.  Small left pleural effusion.  Dependent atelectasis.  Cysts in the left lung have a chronic appearance.  Healing right-sided rib fractures with deformity.  No pneumothorax.  Small amount of pericardial fluid is likely physiologic.  Post cholecystectomy.  Liver, pancreas, spleen, adrenal glands  stable.  Renal cysts are stable.  There is wall thickening of the sigmoid colon and rectum which is mild.  This is associated with diverticulosis.  Little if any stranding in the adjacent fat.  No extraluminal bowel gas.  No abscess.  Small umbilical hernia containing adipose tissue.  Normal appendix.  Bladder and prostate are unremarkable.  Stable mixed sclerotic and lytic changes of L2 with an inferior endplate deformity.  IMPRESSION: Wall thickening of the sigmoid colon and rectum worrisome for inflammatory process.  Consider diverticulitis, infectious colitis, inflammatory bowel disease.  Small left pleural effusion.  Tiny right pleural effusion.  Otherwise stable study.  Original Report Authenticated By: Jamas Lav, M.D.   Dg Abd Acute W/chest  06/06/2012  *RADIOLOGY REPORT*  Clinical Data: Chest pain.  Abdominal cramping.  Diarrhea.  Back pain.  Shortness of breath.   Dysuria.  ACUTE ABDOMEN SERIES (ABDOMEN 2 VIEW & CHEST 1 VIEW)  Comparison: 04/29/2012  Findings: Tortuous thoracic aorta noted.  Emphysema is present with rib deformities related to remote rib fractures, and mild chronic interstitial accentuation.  Wedge resection clips noted in the right lung.  No free intraperitoneal gas is observed beneath the hemidiaphragms. No abnormal air-fluid levels. Technical factors related to patient body habitus reduce diagnostic sensitivity and specificity.  No dilated bowel observed.  IMPRESSION:  1.  Unremarkable bowel gas pattern. 2.  Emphysema with chronic interstitial accentuation. 3.  Tortuous thoracic aorta.  Original Report Authenticated By: Carron Curie, M.D.  Minnie.[place] week]   Impression: 66 y/o AA male with numerous medical problems presents with acute onset bloody diarrhea with diffuse abdominal tenderness. CT showed mild sigmoid and rectal wall thickening. Last TCS one year ago with marginal to poor prep with viscous liquid stool/semi-formed stool noted throughout the colon. He had cecal adenoma, pancolonic diverticula. Suspect infectious process.  Plan: 1. Await stool studies. 2. Continue cipro/flagyl. 3. To discuss with Dr. Gala Romney. Doubt TCS needed at this point. If not, consider advancing diet.  I would like to thank Dr. Megan Salon for allowing [place] to take part in the care of this nice patient.    LOS: 1 day   Neil Crouch  06/07/2012, 8:19 AM  Patient seen and examined. Agree with treatment for presumed infectious colitis at this time. Will follow with you. We'll also advance to a low residue diet at this time.

## 2012-06-07 NOTE — Progress Notes (Signed)
Attempted to see pt for a PT eval.  He refuses due to abdominal discomfort.  Per his report, he is minimally ambulatory.  His primary mobility is with a w/c.  Will try again tomorrow.

## 2012-06-07 NOTE — Progress Notes (Addendum)
Subjective: The patient has expressive aphasia. However, he is able to tell me that he is having some lower abdominal pain and continued loose stools. He does not know if there is still blood in the stools. He has no complaints of chest pain or shortness of breath.  Objective: Vital signs in last 24 hours: Filed Vitals:   06/06/12 2140 06/06/12 2258 06/07/12 0625 06/07/12 0833  BP: 141/88 141/86 118/83   Pulse: 100 88 87 79  Temp: 98 F (36.7 C) 99.3 F (37.4 C) 98.1 F (36.7 C)   TempSrc: Oral Oral Oral   Resp: 16 18 18    Height:      Weight:  121.1 kg (266 lb 15.6 oz)    SpO2: 95% 96% 95% 92%   No intake or output data in the 24 hours ending 06/07/12 0849  Weight change:  Physical exam: General: Pleasant obese 66 year old African- American man lying in bed, in no acute distress. HEENT: Head is normocephalic, nontraumatic. Oropharynx reveals mildly dry mucous membranes. Lungs clear anteriorly with decreased breath sounds in the bases. Heart: S1, S2, with a 2/6 systolic murmur. Abdomen: Mildly obese, positive bowel sounds, diffusely and mildly tender but more tender in the right lower quadrant and the left lower quadrant. No significant distention. Extremities: Trace of pedal edema bilaterally. Pedal pulses barely palpable. Neurologic: He is alert and oriented to himself and hospital. He has mild to moderate expressive aphasia. He has a left strabismus. He has right hemiplegia, he is unable to lift or move his right arm or his right leg. Strength on the left is 5 over 5.  Lab Results: Basic Metabolic Panel:  Basename 06/07/12 0610 06/06/12 1500  NA 136 134*  K 4.0 4.8  CL 103 101  CO2 24 19  GLUCOSE 168* 121*  BUN 17 22  CREATININE 1.02 1.16  CALCIUM 10.1 10.7*  MG 1.5 --  PHOS -- --   Liver Function Tests:  Basename 06/06/12 1500  AST 33  ALT 15  ALKPHOS 70  BILITOT 0.3  PROT 7.6  ALBUMIN 3.6    Basename 06/06/12 1500  LIPASE 19  AMYLASE --   No results  found for this basename: AMMONIA:2 in the last 72 hours CBC:  Basename 06/07/12 0610 06/06/12 1500  WBC 8.3 11.0*  NEUTROABS -- 8.7*  HGB 13.6 15.3  HCT 42.1 48.1  MCV 73.9* 76.1*  PLT 207 PLATELET CLUMPS NOTED ON SMEAR, COUNT APPEARS ADEQUATE   Cardiac Enzymes: No results found for this basename: CKTOTAL:3,CKMB:3,CKMBINDEX:3,TROPONINI:3 in the last 72 hours BNP: No results found for this basename: PROBNP:3 in the last 72 hours D-Dimer: No results found for this basename: DDIMER:2 in the last 72 hours CBG:  Basename 06/07/12 0723 06/06/12 1743  GLUCAP 145* 124*   Hemoglobin A1C: No results found for this basename: HGBA1C in the last 72 hours Fasting Lipid Panel: No results found for this basename: CHOL,HDL,LDLCALC,TRIG,CHOLHDL,LDLDIRECT in the last 72 hours Thyroid Function Tests: No results found for this basename: TSH,T4TOTAL,FREET4,T3FREE,THYROIDAB in the last 72 hours Anemia Panel: No results found for this basename: VITAMINB12,FOLATE,FERRITIN,TIBC,IRON,RETICCTPCT in the last 72 hours Coagulation:  Basename 06/07/12 0610  LABPROT 14.6  INR 1.12   Urine Drug Screen: Drugs of Abuse  No results found for this basename: labopia, cocainscrnur, labbenz, amphetmu, thcu, labbarb    Alcohol Level: No results found for this basename: ETH:2 in the last 72 hours Urinalysis: No results found for this basename: COLORURINE:2,APPERANCEUR:2,LABSPEC:2,PHURINE:2,GLUCOSEU:2,HGBUR:2,BILIRUBINUR:2,KETONESUR:2,PROTEINUR:2,UROBILINOGEN:2,NITRITE:2,LEUKOCYTESUR:2 in the last 72 hours Misc. Labs:  Micro: Recent Results (from the past 240 hour(s))  CLOSTRIDIUM DIFFICILE BY PCR     Status: Normal   Collection Time   06/06/12  9:57 PM      Component Value Range Status Comment   C difficile by pcr NEGATIVE  NEGATIVE Final     Studies/Results: Ct Abdomen Pelvis W Contrast  06/06/2012  *RADIOLOGY REPORT*  Clinical Data: Abdominal pain  CT ABDOMEN AND PELVIS WITH CONTRAST  Technique:   Multidetector CT imaging of the abdomen and pelvis was performed following the standard protocol during bolus administration of intravenous contrast.  Contrast: 173m OMNIPAQUE IOHEXOL 300 MG/ML  SOLN  Comparison: 04/29/2012  Findings: Tiny right pleural effusion.  Small left pleural effusion.  Dependent atelectasis.  Cysts in the left lung have a chronic appearance.  Healing right-sided rib fractures with deformity.  No pneumothorax.  Small amount of pericardial fluid is likely physiologic.  Post cholecystectomy.  Liver, pancreas, spleen, adrenal glands stable.  Renal cysts are stable.  There is wall thickening of the sigmoid colon and rectum which is mild.  This is associated with diverticulosis.  Little if any stranding in the adjacent fat.  No extraluminal bowel gas.  No abscess.  Small umbilical hernia containing adipose tissue.  Normal appendix.  Bladder and prostate are unremarkable.  Stable mixed sclerotic and lytic changes of L2 with an inferior endplate deformity.  IMPRESSION: Wall thickening of the sigmoid colon and rectum worrisome for inflammatory process.  Consider diverticulitis, infectious colitis, inflammatory bowel disease.  Small left pleural effusion.  Tiny right pleural effusion.  Otherwise stable study.  Original Report Authenticated By: AJamas Lav M.D.   Dg Abd Acute W/chest  06/06/2012  *RADIOLOGY REPORT*  Clinical Data: Chest pain.  Abdominal cramping.  Diarrhea.  Back pain.  Shortness of breath.  Dysuria.  ACUTE ABDOMEN SERIES (ABDOMEN 2 VIEW & CHEST 1 VIEW)  Comparison: 04/29/2012  Findings: Tortuous thoracic aorta noted.  Emphysema is present with rib deformities related to remote rib fractures, and mild chronic interstitial accentuation.  Wedge resection clips noted in the right lung.  No free intraperitoneal gas is observed beneath the hemidiaphragms. No abnormal air-fluid levels. Technical factors related to patient body habitus reduce diagnostic sensitivity and specificity.   No dilated bowel observed.  IMPRESSION:  1.  Unremarkable bowel gas pattern. 2.  Emphysema with chronic interstitial accentuation. 3.  Tortuous thoracic aorta.  Original Report Authenticated By: WCarron Curie M.D.    Medications:  Scheduled:   . allopurinol  300 mg Oral Daily  . amLODipine  10 mg Oral Daily  . ciprofloxacin  400 mg Intravenous Once  . ciprofloxacin  400 mg Intravenous Q12H  . cloNIDine  0.2 mg Oral TID  . febuxostat  40 mg Oral Daily  . gabapentin  300 mg Oral BID  . gabapentin  600 mg Oral QHS  . HYDROmorphone  0.5 mg Intravenous Once  . HYDROmorphone  0.5 mg Intravenous Once  . insulin aspart  0-15 Units Subcutaneous TID WC  . insulin aspart  0-5 Units Subcutaneous QHS  . insulin glargine  30 Units Subcutaneous QHS  . lisinopril  20 mg Oral Daily  . metoprolol succinate  50 mg Oral Daily  . metroNIDAZOLE  500 mg Intravenous Once  . metroNIDAZOLE  500 mg Oral Q8H  . ondansetron  4 mg Intravenous Once  . ondansetron  4 mg Intravenous Once  . pantoprazole  80 mg Oral Q1200  . simvastatin  20 mg Oral QHS  .  sodium chloride  250 mL Intravenous Once  . Tamsulosin HCl  0.4 mg Oral QPC breakfast  . DISCONTD: sodium chloride   Intravenous STAT  . DISCONTD: gabapentin  300-600 mg Oral TID  . DISCONTD: metroNIDAZOLE  500 mg Intravenous Once   Continuous:   . 0.9 % NaCl with KCl 20 mEq / L 125 mL/hr at 06/07/12 0108  . DISCONTD: sodium chloride Stopped (06/07/12 0108)   TVG:[id number], acetaminophen, HYDROmorphone (DILAUDID) injection, ondansetron (ZOFRAN) IV, ondansetron, oxyCODONE, traZODone, DISCONTD:  HYDROmorphone (DILAUDID) injection, DISCONTD: ondansetron (ZOFRAN) IV  Assessment: Principal Problem:  *Colitis Active Problems:  DM  Gout, unspecified  HYPERTENSION  DIVERTICULOSIS OF COLON  Lower GI bleed  Hemiplegia affecting dominant side, late effect of cerebrovascular disease   1. Wall thickening of the sigmoid colon and rectum, treating as  colitis/hemorrhagic colitis. He has a known history of diverticulosis and colon polyps. Stool C.diff PCR is negative. His hemoglobin has fallen a little but this may be in part dilutional from IV fluids. Cipro and Flagyl started empirically. GI consult is pending.   Type 2 diabetes mellitus. Currently stable on Lantus and sliding-scale NovoLog. Dose of Lantus decreased in the setting of marginal intake. We'll adjust accordingly and as his diet is advanced. Holding Tradjenta.   Hypertension. Currently controlled on amlodipine, Toprol-XL, lisinopril and clonidine. Will watch for hypotension.  History of stroke with right-sided hemiplegia and expressive aphasia. Stable.  Plan: 1. Await GI consultation and recommendations. 2. Will followup on the stool studies pending. 3. We'll decrease the IV fluids as the patient appears to be more hydrated. 4. Will decrease the dose of some of his antihypertensive medications to avoid symptomatic hypotension. 5. Advance his diet as his diarrhea subsides. 6. PT consult.     LOS: 1 day   Brighten Buzzelli 06/07/2012, 8:49 AM

## 2012-06-07 NOTE — Progress Notes (Signed)
UR Chart Review Completed  

## 2012-06-08 DIAGNOSIS — E119 Type 2 diabetes mellitus without complications: Secondary | ICD-10-CM

## 2012-06-08 DIAGNOSIS — A09 Infectious gastroenteritis and colitis, unspecified: Secondary | ICD-10-CM

## 2012-06-08 DIAGNOSIS — K922 Gastrointestinal hemorrhage, unspecified: Secondary | ICD-10-CM

## 2012-06-08 DIAGNOSIS — R112 Nausea with vomiting, unspecified: Secondary | ICD-10-CM

## 2012-06-08 LAB — GLUCOSE, CAPILLARY
Glucose-Capillary: 140 mg/dL — ABNORMAL HIGH (ref 70–99)
Glucose-Capillary: 196 mg/dL — ABNORMAL HIGH (ref 70–99)

## 2012-06-08 LAB — BASIC METABOLIC PANEL
CO2: 26 mEq/L (ref 19–32)
Calcium: 9.5 mg/dL (ref 8.4–10.5)
Glucose, Bld: 140 mg/dL — ABNORMAL HIGH (ref 70–99)
Sodium: 139 mEq/L (ref 135–145)

## 2012-06-08 LAB — GI PATHOGEN PANEL BY PCR, STOOL
C difficile toxin A/B: NEGATIVE
G lamblia by PCR: NEGATIVE
Rotavirus A by PCR: NEGATIVE
Salmonella by PCR: NEGATIVE
Shigella by PCR: NEGATIVE

## 2012-06-08 LAB — CBC
MCH: 24.3 pg — ABNORMAL LOW (ref 26.0–34.0)
MCV: 75.1 fL — ABNORMAL LOW (ref 78.0–100.0)
Platelets: 195 10*3/uL (ref 150–400)
RBC: 5.07 MIL/uL (ref 4.22–5.81)

## 2012-06-08 LAB — TSH: TSH: 1.245 u[IU]/mL (ref 0.350–4.500)

## 2012-06-08 LAB — HEMOGLOBIN A1C: Mean Plasma Glucose: 180 mg/dL — ABNORMAL HIGH (ref <117)

## 2012-06-08 MED ORDER — INSULIN GLARGINE 100 UNIT/ML ~~LOC~~ SOLN
40.0000 [IU] | Freq: Every day | SUBCUTANEOUS | Status: DC
  Administered 2012-06-08: 40 [IU] via SUBCUTANEOUS

## 2012-06-08 MED ORDER — SODIUM CHLORIDE 0.9 % IJ SOLN
INTRAMUSCULAR | Status: AC
  Administered 2012-06-08: 10 mL
  Filled 2012-06-08: qty 3

## 2012-06-08 MED ORDER — POTASSIUM CHLORIDE CRYS ER 20 MEQ PO TBCR
20.0000 meq | EXTENDED_RELEASE_TABLET | Freq: Every day | ORAL | Status: DC
  Administered 2012-06-08 – 2012-06-09 (×2): 20 meq via ORAL
  Filled 2012-06-08 (×2): qty 1

## 2012-06-08 MED ORDER — SODIUM CHLORIDE 0.9 % IJ SOLN
INTRAMUSCULAR | Status: AC
  Filled 2012-06-08: qty 6

## 2012-06-08 MED ORDER — FUROSEMIDE 20 MG PO TABS
20.0000 mg | ORAL_TABLET | Freq: Two times a day (BID) | ORAL | Status: AC
  Administered 2012-06-08 – 2012-06-09 (×2): 20 mg via ORAL
  Filled 2012-06-08 (×3): qty 1

## 2012-06-08 MED ORDER — CIPROFLOXACIN HCL 250 MG PO TABS
500.0000 mg | ORAL_TABLET | Freq: Two times a day (BID) | ORAL | Status: DC
  Administered 2012-06-08 – 2012-06-09 (×3): 500 mg via ORAL
  Filled 2012-06-08 (×3): qty 2

## 2012-06-08 MED ORDER — LEVALBUTEROL HCL 0.63 MG/3ML IN NEBU
0.6300 mg | INHALATION_SOLUTION | Freq: Three times a day (TID) | RESPIRATORY_TRACT | Status: DC
  Administered 2012-06-08 – 2012-06-09 (×3): 0.63 mg via RESPIRATORY_TRACT
  Filled 2012-06-08 (×3): qty 3

## 2012-06-08 NOTE — Progress Notes (Signed)
Pt once again refuses PT consult.  He reports no pain but "I just don't feel like it".  I do not anticipate that he will have any difficulty with transfers once he returns home.  Because of refusals, we will d/c PT.

## 2012-06-08 NOTE — Progress Notes (Signed)
Subjective: The patient has expressive aphasia. He has had fewer loose stools. No report of recent bloody stools. Overall, he feels better, but has chronic lower abdominal pain.  Objective: Vital signs in last 24 hours: Filed Vitals:   06/08/12 0247 06/08/12 0510 06/08/12 0707 06/08/12 0951  BP: 107/76 127/83  121/84  Pulse: 73 72 74 78  Temp: 97.7 F (36.5 C) 97.8 F (36.6 C)  97.7 F (36.5 C)  TempSrc: Oral Oral  Oral  Resp: 20 20  18   Height:      Weight:  125.102 kg (275 lb 12.8 oz)    SpO2: 98% 96% 94% 98%    Intake/Output Summary (Last 24 hours) at 06/08/12 1016 Last data filed at 06/08/12 0900  Gross per 24 hour  Intake 4652.5 ml  Output    550 ml  Net 4102.5 ml    Weight change: 7.167 kg (15 lb 12.8 oz) Physical exam: General: Pleasant obese 66 year old African- American man lying in bed, in no acute distress. HEENT: Head is normocephalic, nontraumatic. Oropharynx reveals mildly dry mucous membranes. Lungs: A few upper airway crackles and wheezes.[name] Heart: S1, S2, with a 2/6 systolic murmur. Abdomen: Mildly obese, positive bowel sounds, diffusely and mildly tender but more tender in the right lower quadrant and the left lower quadrant. No significant distention. Extremities: Trace of pedal edema bilaterally. Pedal pulses barely palpable. Neurologic: He is alert and oriented to himself and hospital. He has mild to moderate expressive aphasia. He has a left strabismus. He has right hemiplegia, he is unable to lift or move his right arm or his right leg. Strength on the left is 5 over 5.  Lab Results: Basic Metabolic Panel:  Basename 06/08/12 0450 06/07/12 0610  NA 139 136  K 4.3 4.0  CL 105 103  CO2 26 24  GLUCOSE 140* 168*  BUN 15 17  CREATININE 1.13 1.02  CALCIUM 9.5 10.1  MG -- 1.5  PHOS -- --   Liver Function Tests:  Basename 06/06/12 1500  AST 33  ALT 15  ALKPHOS 70  BILITOT 0.3  PROT 7.6  ALBUMIN 3.6    Basename 06/06/12 1500  LIPASE 19    AMYLASE --   No results found for this basename: AMMONIA:2 in the last 72 hours CBC:  Basename 06/08/12 0450 06/07/12 0610 06/06/12 1500  WBC 7.2 8.3 --  NEUTROABS -- -- 8.7*  HGB 12.3* 13.6 --  HCT 38.1* 42.1 --  MCV 75.1* 73.9* --  PLT 195 207 --   Cardiac Enzymes: No results found for this basename: CKTOTAL:3,CKMB:3,CKMBINDEX:3,TROPONINI:3 in the last 72 hours BNP: No results found for this basename: PROBNP:3 in the last 72 hours D-Dimer: No results found for this basename: DDIMER:2 in the last 72 hours CBG:  Basename 06/08/12 0752 06/07/12 2058 06/07/12 1611 06/07/12 1119 06/07/12 0723 06/06/12 1743  GLUCAP 140* 194* 124* 135* 145* 124*   Hemoglobin A1C:  Basename 06/07/12 0610  HGBA1C 7.9*   Fasting Lipid Panel: No results found for this basename: CHOL,HDL,LDLCALC,TRIG,CHOLHDL,LDLDIRECT in the last 72 hours Thyroid Function Tests:  Basename 06/07/12 0610  TSH 1.245  T4TOTAL --  FREET4 --  T3FREE --  THYROIDAB --   Anemia Panel: No results found for this basename: VITAMINB12,FOLATE,FERRITIN,TIBC,IRON,RETICCTPCT in the last 72 hours Coagulation:  Basename 06/07/12 0610  LABPROT 14.6  INR 1.12   Urine Drug Screen: Drugs of Abuse  No results found for this basename: labopia,  cocainscrnur,  labbenz,  amphetmu,  thcu,  labbarb  Alcohol Level: No results found for this basename: ETH:2 in the last 72 hours Urinalysis: No results found for this basename: COLORURINE:2,APPERANCEUR:2,LABSPEC:2,PHURINE:2,GLUCOSEU:2,HGBUR:2,BILIRUBINUR:2,KETONESUR:2,PROTEINUR:2,UROBILINOGEN:2,NITRITE:2,LEUKOCYTESUR:2 in the last 72 hours Misc. Labs:   Micro: Recent Results (from the past 240 hour(s))  CLOSTRIDIUM DIFFICILE BY PCR     Status: Normal   Collection Time   06/06/12  9:57 PM      Component Value Range Status Comment   C difficile by pcr NEGATIVE  NEGATIVE Final     Studies/Results: Ct Abdomen Pelvis W Contrast  06/06/2012  *RADIOLOGY REPORT*  Clinical Data:  Abdominal pain  CT ABDOMEN AND PELVIS WITH CONTRAST  Technique:  Multidetector CT imaging of the abdomen and pelvis was performed following the standard protocol during bolus administration of intravenous contrast.  Contrast: 148m OMNIPAQUE IOHEXOL 300 MG/ML  SOLN  Comparison: 04/29/2012  Findings: Tiny right pleural effusion.  Small left pleural effusion.  Dependent atelectasis.  Cysts in the left lung have a chronic appearance.  Healing right-sided rib fractures with deformity.  No pneumothorax.  Small amount of pericardial fluid is likely physiologic.  Post cholecystectomy.  Liver, pancreas, spleen, adrenal glands stable.  Renal cysts are stable.  There is wall thickening of the sigmoid colon and rectum which is mild.  This is associated with diverticulosis.  Little if any stranding in the adjacent fat.  No extraluminal bowel gas.  No abscess.  Small umbilical hernia containing adipose tissue.  Normal appendix.  Bladder and prostate are unremarkable.  Stable mixed sclerotic and lytic changes of L2 with an inferior endplate deformity.  IMPRESSION: Wall thickening of the sigmoid colon and rectum worrisome for inflammatory process.  Consider diverticulitis, infectious colitis, inflammatory bowel disease.  Small left pleural effusion.  Tiny right pleural effusion.  Otherwise stable study.  Original Report Authenticated By: AJamas Lav M.D.   Dg Abd Acute W/chest  06/06/2012  *RADIOLOGY REPORT*  Clinical Data: Chest pain.  Abdominal cramping.  Diarrhea.  Back pain.  Shortness of breath.  Dysuria.  ACUTE ABDOMEN SERIES (ABDOMEN 2 VIEW & CHEST 1 VIEW)  Comparison: 04/29/2012  Findings: Tortuous thoracic aorta noted.  Emphysema is present with rib deformities related to remote rib fractures, and mild chronic interstitial accentuation.  Wedge resection clips noted in the right lung.  No free intraperitoneal gas is observed beneath the hemidiaphragms. No abnormal air-fluid levels. Technical factors related to  patient body habitus reduce diagnostic sensitivity and specificity.  No dilated bowel observed.  IMPRESSION:  1.  Unremarkable bowel gas pattern. 2.  Emphysema with chronic interstitial accentuation. 3.  Tortuous thoracic aorta.  Original Report Authenticated By: WCarron Curie M.D.    Medications:  Scheduled:    . allopurinol  300 mg Oral Daily  . amLODipine  5 mg Oral Daily  . ciprofloxacin  500 mg Oral BID  . cloNIDine  0.2 mg Oral TID  . febuxostat  40 mg Oral Daily  . gabapentin  300 mg Oral BID  . gabapentin  600 mg Oral QHS  . insulin aspart  0-15 Units Subcutaneous TID WC  . insulin aspart  0-5 Units Subcutaneous QHS  . insulin glargine  30 Units Subcutaneous QHS  . lisinopril  10 mg Oral Daily  . metoprolol succinate  50 mg Oral Daily  . metroNIDAZOLE  500 mg Oral Q8H  . pantoprazole  80 mg Oral Q1200  . simvastatin  20 mg Oral QHS  . sodium chloride      . Tamsulosin HCl  0.4 mg Oral  QPC breakfast  . DISCONTD: ciprofloxacin  400 mg Intravenous Q12H   Continuous:    . 0.9 % NaCl with KCl 20 mEq / L 50 mL/hr at 06/08/12 0820   LNZ:[id number], acetaminophen, HYDROmorphone (DILAUDID) injection, ondansetron (ZOFRAN) IV, ondansetron, oxyCODONE, traZODone  Assessment: Principal Problem:  *Colitis Active Problems:  DM  Gout, unspecified  HYPERTENSION  DIVERTICULOSIS OF COLON  Lower GI bleed  Hemiplegia affecting dominant side, late effect of cerebrovascular disease   1. Wall thickening of the sigmoid colon and rectum, treating as colitis/hemorrhagic colitis. He has a known history of diverticulosis and colon polyps. Stool C.diff PCR is negative. His hemoglobin has fallen a little but this may be in part dilutional from IV fluids. Cipro and Flagyl started empirically. GI consult noted and appreciated.   Type 2 diabetes mellitus. Currently stable on Lantus and sliding-scale NovoLog. Dose of Lantus decreased in the setting of marginal intake. We'll adjust  accordingly and as his diet is advanced. Holding Tradjenta. Hemoglobin A1c 7.9.  Hypertension. Currently controlled on amlodipine, Toprol-XL, lisinopril and clonidine. Will watch for hypotension.  History of stroke with right-sided hemiplegia and expressive aphasia. Stable.  Plan: 1. His diet was advanced by GI. 2. Will give Xopenex nebulizer and gentle Lasix for a few crackles/wheezes auscultated on exam and mild lower extremity edema. 3. Will increase the dose of Lantus as he is now eating better. 4. Possible discharge tomorrow if he remains stable and improved. We'll change Cipro to by mouth.      LOS: 2 days   Demia Viera 06/08/2012, 10:16 AM

## 2012-06-08 NOTE — Clinical Social Work Note (Signed)
Spoke w wife to get contact information for son, Lennette Bihari.  Wife stated that husband completed financial  POA paperwork for w lawyer several years ago, but wife cannot provide paperwork until Monday.  Wife states that she is "OK w him choosing" Lennette Bihari as healthcare POA, and will discuss w husband.  Could only provide phone number for son, did not have address.  When CSW went to room today, patient was sleeping, when awakened he said he did not want to complete at this time.  Passing on to CSW this afternoon.  Justice Britain Clinical Social Worker [phone])

## 2012-06-08 NOTE — Progress Notes (Signed)
Subjective:  Feels better today. Stools have slowed down. Only one recorded yesterday but patient believes he had more. No n/v. Tolerating diet.   Objective: Vital signs in last 24 hours: Temp:  [97.6 F (36.4 C)-98.2 F (36.8 C)] 97.8 F (36.6 C) (06/28 0510) Pulse Rate:  [70-82] 74  (06/28 0707) Resp:  [18-20] 20  (06/28 0510) BP: (100-127)/(67-83) 127/83 mmHg (06/28 0510) SpO2:  [92 %-99 %] 94 % (06/28 0707) Weight:  [275 lb 12.8 oz (125.102 kg)] 275 lb 12.8 oz (125.102 kg) (06/28 0510) Last BM Date: 06/07/12 General:   Alert,  Well-developed, well-nourished, pleasant and cooperative in NAD Head:  Normocephalic and atraumatic. Eyes:  Sclera clear, no icterus.  Abdomen:  Soft, nontender and nondistended.  Normal bowel sounds, without guarding, and without rebound.   Extremities:  Without clubbing, deformity or edema. Neurologic:  Alert and  oriented x4;  hemiparesis. Skin:  Intact without significant lesions or rashes. Psych:  Alert and cooperative. Normal mood and affect.  Intake/Output from previous day: 06/27 0701 - 06/28 0700 In: 4212.5 [P.O.:360; I.V.:3452.5; IV Piggyback:400] Out: 400 [Urine:400] Intake/Output this shift:    Lab Results: CBC  Basename 06/08/12 0450 06/07/12 0610 06/06/12 1500  WBC 7.2 8.3 11.0*  HGB 12.3* 13.6 15.3  HCT 38.1* 42.1 48.1  MCV 75.1* 73.9* 76.1*  PLT 195 207 PLATELET CLUMPS NOTED ON SMEAR, COUNT APPEARS ADEQUATE   BMET  Basename 06/08/12 0450 06/07/12 0610 06/06/12 1500  NA 139 136 134*  K 4.3 4.0 4.8  CL 105 103 101  CO2 26 24 19   GLUCOSE 140* 168* 121*  BUN 15 17 22   CREATININE 1.13 1.02 1.16  CALCIUM 9.5 10.1 10.7*   LFTs  Basename 06/06/12 1500  BILITOT 0.3  BILIDIR --  IBILI --  ALKPHOS 70  AST 33  ALT 15  PROT 7.6  ALBUMIN 3.6    Basename 06/06/12 1500  LIPASE 19   PT/INR  Basename 06/07/12 0610  LABPROT 14.6  INR 1.12   GI pathogen panel: pending      Assessment: Acute onset bloody diarrhea  with mild sigmoid/rectal wall thickening. Presumed infectious colitis. One stool recorded yesterday. Clinically improved.   Plan: 1. Complete course of Cipro/Flagyl.  2. F/U pending stool PCRs.    LOS: 2 days   Neil Crouch  06/08/2012, 8:03 AM

## 2012-06-09 ENCOUNTER — Encounter (HOSPITAL_COMMUNITY): Payer: Self-pay | Admitting: Internal Medicine

## 2012-06-09 DIAGNOSIS — A09 Infectious gastroenteritis and colitis, unspecified: Secondary | ICD-10-CM

## 2012-06-09 DIAGNOSIS — I10 Essential (primary) hypertension: Secondary | ICD-10-CM

## 2012-06-09 DIAGNOSIS — E119 Type 2 diabetes mellitus without complications: Secondary | ICD-10-CM

## 2012-06-09 DIAGNOSIS — K922 Gastrointestinal hemorrhage, unspecified: Secondary | ICD-10-CM

## 2012-06-09 LAB — GLUCOSE, CAPILLARY: Glucose-Capillary: 212 mg/dL — ABNORMAL HIGH (ref 70–99)

## 2012-06-09 MED ORDER — METRONIDAZOLE 500 MG PO TABS: 500.0000 mg | ORAL_TABLET | Freq: Three times a day (TID) | ORAL | Status: AC

## 2012-06-09 MED ORDER — CIPROFLOXACIN HCL 500 MG PO TABS: 500.0000 mg | ORAL_TABLET | Freq: Two times a day (BID) | ORAL | Status: AC

## 2012-06-09 NOTE — Progress Notes (Addendum)
Met with patient this morning to discuss completion of and Advanced Directive and [place]. Patient stated that he was not interested in completing the documents at this time but he is aware that CSW can assist.  Patient d/c home today per MD.  Patient still declined offer for assistance.  Lorie Phenix. Hitchcock, [place]

## 2012-06-09 NOTE — Progress Notes (Signed)
Discharge instructions reviewed with patient and patient's wife, both voiced understanding. Patient's wife given discharge instructions and prescriptions. Patient in stable condition.

## 2012-06-09 NOTE — Discharge Summary (Signed)
Physician Discharge Summary  SEICHI KAUFHOLD MRN: [id number] DOB/AGE: [birth year] 66 y.o.  PCP: Vic Blackbird, MD   Admit date: 06/06/2012 Discharge date: 06/09/2012  Discharge Diagnoses:  1. Lower GI bleeding secondary to hemorrhagic colitis. 2. Mild blood loss anemia. Hemoglobin was 12.3 at the time of discharge. 3. History of colon polyp (tubular adenoma), diverticulosis, and internal hemorrhoids per colonoscopy in April of 2012. 4. Type 2 diabetes mellitus. Hemoglobin A1c 7.9. 5. Hypertension. 6. Gout. 7. Right sided hemiplegia and expressive aphasia secondary to previous stroke. 8. Chronic abdominal pain. Consider neuropathic in part.    Medication List  As of 06/09/2012 10:23 AM   TAKE these medications         allopurinol 300 MG tablet   Commonly known as: ZYLOPRIM   Take 300 mg by mouth daily.      amLODipine 10 MG tablet   Commonly known as: NORVASC   Take 10 mg by mouth daily.      ciprofloxacin 500 MG tablet   Commonly known as: CIPRO   Take 1 tablet (500 mg total) by mouth 2 (two) times daily. Take for 10 more days to treat colitis.      cloNIDine 0.2 MG tablet   Commonly known as: CATAPRES   Take 1 tablet (0.2 mg total) by mouth 3 (three) times daily.      febuxostat 40 MG tablet   Commonly known as: ULORIC   Take 1 tablet (40 mg total) by mouth daily.      gabapentin 300 MG capsule   Commonly known as: NEURONTIN   Take 300-600 mg by mouth 3 (three) times daily. 1 tablet twice a day and 2 tablets at bedtime      hydrochlorothiazide 25 MG tablet   Commonly known as: HYDRODIURIL   Take 25 mg by mouth daily.      insulin glargine 100 UNIT/ML injection   Commonly known as: LANTUS   Inject 60 Units into the skin at bedtime.      insulin lispro 100 UNIT/ML injection   Commonly known as: HUMALOG   Inject 14-17 Units into the skin 3 (three) times daily before meals. Uses according to sliding scale at home.      linagliptin 5 MG Tabs tablet   Commonly known as: TRADJENTA   Take 5 mg by mouth daily.      lisinopril 20 MG tablet   Commonly known as: PRINIVIL,ZESTRIL   Take 20 mg by mouth daily.      metoprolol succinate 50 MG 24 hr tablet   Commonly known as: TOPROL-XL   Take 50 mg by mouth daily. Take with or immediately following a meal.      metroNIDAZOLE 500 MG tablet   Commonly known as: FLAGYL   Take 1 tablet (500 mg total) by mouth every 8 (eight) hours. Take for 10 more days for treatment of colitis.      omeprazole 40 MG capsule   Commonly known as: PRILOSEC   Take 40 mg by mouth daily.      simvastatin 20 MG tablet   Commonly known as: ZOCOR   Take 1 tablet (20 mg total) by mouth at bedtime.      Tamsulosin HCl 0.4 MG Caps   Commonly known as: FLOMAX   Take 0.4 mg by mouth daily after breakfast.            Discharge Condition: Improved.  Disposition: 01-Home or Self Care   Consults: Gastroenterologist, Garfield Cornea,  M.D.   Significant Diagnostic Studies: Ct Abdomen Pelvis W Contrast  06/06/2012  *RADIOLOGY REPORT*  Clinical Data: Abdominal pain  CT ABDOMEN AND PELVIS WITH CONTRAST  Technique:  Multidetector CT imaging of the abdomen and pelvis was performed following the standard protocol during bolus administration of intravenous contrast.  Contrast: 118m OMNIPAQUE IOHEXOL 300 MG/ML  SOLN  Comparison: 04/29/2012  Findings: Tiny right pleural effusion.  Small left pleural effusion.  Dependent atelectasis.  Cysts in the left lung have a chronic appearance.  Healing right-sided rib fractures with deformity.  No pneumothorax.  Small amount of pericardial fluid is likely physiologic.  Post cholecystectomy.  Liver, pancreas, spleen, adrenal glands stable.  Renal cysts are stable.  There is wall thickening of the sigmoid colon and rectum which is mild.  This is associated with diverticulosis.  Little if any stranding in the adjacent fat.  No extraluminal bowel gas.  No abscess.  Small umbilical hernia  containing adipose tissue.  Normal appendix.  Bladder and prostate are unremarkable.  Stable mixed sclerotic and lytic changes of L2 with an inferior endplate deformity.  IMPRESSION: Wall thickening of the sigmoid colon and rectum worrisome for inflammatory process.  Consider diverticulitis, infectious colitis, inflammatory bowel disease.  Small left pleural effusion.  Tiny right pleural effusion.  Otherwise stable study.  Original Report Authenticated By: AJamas Lav M.D.   Dg Abd Acute W/chest  06/06/2012  *RADIOLOGY REPORT*  Clinical Data: Chest pain.  Abdominal cramping.  Diarrhea.  Back pain.  Shortness of breath.  Dysuria.  ACUTE ABDOMEN SERIES (ABDOMEN 2 VIEW & CHEST 1 VIEW)  Comparison: 04/29/2012  Findings: Tortuous thoracic aorta noted.  Emphysema is present with rib deformities related to remote rib fractures, and mild chronic interstitial accentuation.  Wedge resection clips noted in the right lung.  No free intraperitoneal gas is observed beneath the hemidiaphragms. No abnormal air-fluid levels. Technical factors related to patient body habitus reduce diagnostic sensitivity and specificity.  No dilated bowel observed.  IMPRESSION:  1.  Unremarkable bowel gas pattern. 2.  Emphysema with chronic interstitial accentuation. 3.  Tortuous thoracic aorta.  Original Report Authenticated By: WCarron Curie M.D.     Microbiology: Recent Results (from the past 240 hour(s))  CLOSTRIDIUM DIFFICILE BY PCR     Status: Normal   Collection Time   06/06/12  9:57 PM      Component Value Range Status Comment   C difficile by pcr NEGATIVE  NEGATIVE Final      Labs: Results for orders placed during the hospital encounter of 06/06/12 (from the past 48 hour(s))  GLUCOSE, CAPILLARY     Status: Abnormal   Collection Time   06/07/12 11:19 AM      Component Value Range Comment   Glucose-Capillary 135 (*) 70 - 99 mg/dL    Comment 1 Notify RN     GLUCOSE, CAPILLARY     Status: Abnormal   Collection  Time   06/07/12  4:11 PM      Component Value Range Comment   Glucose-Capillary 124 (*) 70 - 99 mg/dL    Comment 1 Notify RN     GLUCOSE, CAPILLARY     Status: Abnormal   Collection Time   06/07/12  8:58 PM      Component Value Range Comment   Glucose-Capillary 194 (*) 70 - 99 mg/dL    Comment 1 Documented in Chart      Comment 2 Notify RN     BASIC METABOLIC PANEL  Status: Abnormal   Collection Time   06/08/12  4:50 AM      Component Value Range Comment   Sodium 139  135 - 145 mEq/L    Potassium 4.3  3.5 - 5.1 mEq/L    Chloride 105  96 - 112 mEq/L    CO2 26  19 - 32 mEq/L    Glucose, Bld 140 (*) 70 - 99 mg/dL    BUN 15  6 - 23 mg/dL    Creatinine, Ser 1.13  0.50 - 1.35 mg/dL    Calcium 9.5  8.4 - 10.5 mg/dL    GFR calc non Af Amer 66 (*) >90 mL/min    GFR calc Af Amer 77 (*) >90 mL/min   CBC     Status: Abnormal   Collection Time   06/08/12  4:50 AM      Component Value Range Comment   WBC 7.2  4.0 - 10.5 K/uL    RBC 5.07  4.22 - 5.81 MIL/uL    Hemoglobin 12.3 (*) 13.0 - 17.0 g/dL    HCT 38.1 (*) 39.0 - 52.0 %    MCV 75.1 (*) 78.0 - 100.0 fL    MCH 24.3 (*) 26.0 - 34.0 pg    MCHC 32.3  30.0 - 36.0 g/dL    RDW 17.5 (*) 11.5 - 15.5 %    Platelets 195  150 - 400 K/uL   GLUCOSE, CAPILLARY     Status: Abnormal   Collection Time   06/08/12  7:52 AM      Component Value Range Comment   Glucose-Capillary 140 (*) 70 - 99 mg/dL   GLUCOSE, CAPILLARY     Status: Abnormal   Collection Time   06/08/12 11:19 AM      Component Value Range Comment   Glucose-Capillary 273 (*) 70 - 99 mg/dL   GLUCOSE, CAPILLARY     Status: Abnormal   Collection Time   06/08/12  4:21 PM      Component Value Range Comment   Glucose-Capillary 165 (*) 70 - 99 mg/dL   GLUCOSE, CAPILLARY     Status: Abnormal   Collection Time   06/08/12 11:04 PM      Component Value Range Comment   Glucose-Capillary 196 (*) 70 - 99 mg/dL   GLUCOSE, CAPILLARY     Status: Abnormal   Collection Time   06/09/12  7:31 AM       Component Value Range Comment   Glucose-Capillary 123 (*) 70 - 99 mg/dL      HPI : The patient is a 66 year old man with a history significant for type 2 diabetes mellitus, previous stroke, hypertension, diverticulosis, and colon polyps, who presented to the emergency department on 06/06/2012 with a chief complaint of bloody diarrhea for 3 days. In the emergency department, he was noted to be afebrile and hemodynamically stable. His lab data were significant for a WBC of 11.0, hemoglobin of 15.3, sodium of 134, normal liver transaminases, normal lipase, and glucose of 124. CT of his abdomen and pelvis revealed wall thickening of the sigmoid colon and rectum. He was admitted for further evaluation and management.  HOSPITAL COURSE: The patient was started on empiric treatment for infectious versus inflammatory hemorrhagic colitis with Cipro intravenously and Flagyl orally. His antihypertensive medications were modified to avoid symptomatic relative hypotension in the setting of poor oral intake. His diabetes medications were also modified to avoid symptomatic hypoglycemia in the setting of poor oral intake. Sliding scale NovoLog was added  and the dose of Lantus was decreased. IV fluids for hydration were started. Symptomatic treatment was given with as needed analgesics and as needed antiemetics. For further evaluation, stool studies were ordered. C. difficile PCR was negative. PCR for all of the other enteric pathogens on the panel was all negative. Gastroenterologist, Dr. Gala Romney was consulted. He agreed with medical management. There appeared to be no indication for an immediate followup colonoscopy as the patient was improving clinically. Subsequently, the patient's diet was advanced from clear liquids to a soft modified diet which he tolerated well.  He was noted to have chronic abdominal pain. He is being followed by Dr. Oneida Alar and Dr. Gala Romney for this. It is possible that the patient's pain could be in  part neuropathic. This was explained to the patient and his wife. Therefore, I explained that it was imperative that he is compliant with gabapentin therapy.  The patient remained hemodynamically stable and afebrile. His hemoglobin A1c was noted to be 7.9. His capillary blood glucose was well controlled during the hospitalization on a lower dose of Lantus and withholding Tradjenta.. His TSH was within normal limits at 1.2. His hemoglobin did decrease slightly to 12.3, but the decrease was in part secondary to hemodilution from IV fluids. At the time of hospital discharge and 24 hours prior, he had no further bloody stools. He received 3-1/2 days of Flagyl and Cipro. He was discharged to home on 10 more days of therapy. He was advised to followup with Dr. Gala Romney or Dr. Oneida Alar in 3-4 weeks.  Discharge Exam:  Blood pressure 118/83, pulse 77, temperature 98.2 F (36.8 C), temperature source Oral, resp. rate 20, height 6' 1"  (1.854 m), weight 125.193 kg (276 lb), SpO2 95.00%.  Lungs: Clear to auscultation anteriorly, decreased breath sounds in the bases. Heart: S1, S2, with no murmurs rubs or gallops. Abdomen: Positive bowel sounds, soft, obese, mildly tender in the hypogastrium, no distention. Extremities: Trace of pedal edema bilaterally.   Discharge Orders    Future Appointments: Provider: Department: Dept Phone: Center:   06/13/2012 8:30 AM [name], [place] [phone] [facility]   07/18/2012 9:15 AM [name], MD [facility] [phone] [facility]     Future Orders Please Complete By Expires   Diet - low sodium heart healthy      Diet Carb Modified      Increase activity slowly      Discharge instructions      Comments:   You will need to call Dr. Rourk's/ Dr. Nona Dell office on Monday for hospital followup in 3-4 weeks for colitis.      Follow-up Information    Follow up with Vic Blackbird, MD on 06/13/2012. (8:30 AM)    Contact information:   [address]  [place] [phone]       Schedule an appointment as soon as possible for a visit with Barney Drain, MD. (For followup of colitis.)    Contact information:   [address] [place] [address] [place] [phone]          Discharge time: Greater than 35 minutes  Signed: Raquell Richer 06/09/2012, 10:23 AM

## 2012-06-09 NOTE — Progress Notes (Signed)
Patient in stable condition and transported out by tech.

## 2012-06-13 ENCOUNTER — Ambulatory Visit (INDEPENDENT_AMBULATORY_CARE_PROVIDER_SITE_OTHER): Payer: Medicare Other | Admitting: Family Medicine

## 2012-06-13 ENCOUNTER — Encounter: Payer: Self-pay | Admitting: Family Medicine

## 2012-06-13 VITALS — BP 124/76 | HR 83 | Resp 18

## 2012-06-13 DIAGNOSIS — K529 Noninfective gastroenteritis and colitis, unspecified: Secondary | ICD-10-CM

## 2012-06-13 DIAGNOSIS — E119 Type 2 diabetes mellitus without complications: Secondary | ICD-10-CM

## 2012-06-13 DIAGNOSIS — R1011 Right upper quadrant pain: Secondary | ICD-10-CM

## 2012-06-13 DIAGNOSIS — I10 Essential (primary) hypertension: Secondary | ICD-10-CM

## 2012-06-13 DIAGNOSIS — K5289 Other specified noninfective gastroenteritis and colitis: Secondary | ICD-10-CM

## 2012-06-13 LAB — GLUCOSE, POCT (MANUAL RESULT ENTRY): POC Glucose: 150 mg/dl — AB (ref 70–99)

## 2012-06-13 MED ORDER — AMLODIPINE BESYLATE 10 MG PO TABS: 10.0000 mg | ORAL_TABLET | Freq: Every day | ORAL | Status: DC

## 2012-06-13 NOTE — Assessment & Plan Note (Signed)
His pain is now at his baseline. He will continue the gabapentin for likely neuropathic pain status post stroke and with his diabetes

## 2012-06-13 NOTE — Assessment & Plan Note (Signed)
His diabetes is much improved. His wife is concerned that the insulin costs them up to $900 currently because they are in the donut hole regarding his Medicare plan. I will discuss with his endocrinologist to see if we can supply them with some insulin he is on both Humalog and Lantus as well as tradjenta

## 2012-06-13 NOTE — Progress Notes (Signed)
  Subjective:    Patient ID: [name], male    DOB: 01-26-1946, 66 y.o.   MRN: [id number]  HPI Patient presents for hospital followup. He was admitted for 3 days for infectious versus inflammatory hemorrhagic colitis. He was continued on Cipro and Flagyl and still has a few days left on this. His diarrhea has resolved. He now has soft bowel movements once or twice a day. His wife denies seeing any recent blood in the stools. He did not require blood transfusion his hemoglobin was 12.3 on discharge. He's been followed by endocrinology for his diabetes. His Lantus was increased to 60 units and Humalog 14 units with meals. His A1c was 7.9% during hospital admission. Fasting blood sugars one 150-160s. CBG in office today 150. He continues to have his typical right upper quadrant pain which is unchanged.  Review of Systems  GEN- denies fatigue, fever, weight loss,weakness, recent illness HEENT- denies eye drainage, change in vision, nasal discharge, CVS- denies chest pain, palpitations RESP- denies SOB, cough, wheeze ABD- denies N/V, change in stools, +abd pain GU- denies dysuria, hematuria, dribbling, incontinence MSK- denies joint pain, muscle aches, injury Neuro- denies headache, dizziness, syncope, seizure activity      Objective:   Physical Exam GEN- NAD, alert and oriented x3, sitting in wheelchair, obese HEENT- EOMI, non injected sclera, pink conjunctiva, MMM, oropharynx clear, CVS- RRR, no murmur RESP-CTAB ABD- NABS, soft, mild TTP over RUQ and epigastric, ND, no rebound, no gaurding  EXT- trace edema Pulses- Radial, DP- 2+ Neuro- right hemiparesis,          Assessment & Plan:

## 2012-06-13 NOTE — Assessment & Plan Note (Signed)
Complete antibiotics. His stools are now back at baseline. He will followup with gastroenterology. They did not recommend a repeat colonoscopy at this time explained to patient wife red flags to seek care regarding his abdominal pain in his stools.

## 2012-06-13 NOTE — Patient Instructions (Addendum)
Continue your current medications A1C was much improved at 7.9%  Finish your antibiotics Call and f/u with Dr. Gala Romney Call for any concerns  Change f/u to Sept - 2nd week

## 2012-06-13 NOTE — Assessment & Plan Note (Signed)
Well-controlled no change the meds

## 2012-07-18 ENCOUNTER — Ambulatory Visit: Payer: Medicare Other | Admitting: Family Medicine

## 2012-08-08 ENCOUNTER — Telehealth: Payer: Self-pay | Admitting: Family Medicine

## 2012-08-09 ENCOUNTER — Other Ambulatory Visit: Payer: Self-pay

## 2012-08-09 MED ORDER — GABAPENTIN 300 MG PO CAPS: 300.0000 mg | ORAL_CAPSULE | Freq: Three times a day (TID) | ORAL | Status: DC

## 2012-08-10 NOTE — Telephone Encounter (Signed)
Refill sent to mail order.

## 2012-08-11 ENCOUNTER — Emergency Department (HOSPITAL_COMMUNITY): Payer: Medicare Other

## 2012-08-11 ENCOUNTER — Emergency Department (HOSPITAL_COMMUNITY)
Admission: EM | Admit: 2012-08-11 | Discharge: 2012-08-12 | Disposition: A | Discharge status: Home / Self Care | Payer: Medicare Other | Attending: Emergency Medicine | Admitting: Emergency Medicine

## 2012-08-11 ENCOUNTER — Encounter (HOSPITAL_COMMUNITY): Payer: Self-pay | Admitting: Emergency Medicine

## 2012-08-11 DIAGNOSIS — I10 Essential (primary) hypertension: Secondary | ICD-10-CM | POA: Insufficient documentation

## 2012-08-11 DIAGNOSIS — Z87898 Personal history of other specified conditions: Secondary | ICD-10-CM | POA: Insufficient documentation

## 2012-08-11 DIAGNOSIS — Z8673 Personal history of transient ischemic attack (TIA), and cerebral infarction without residual deficits: Secondary | ICD-10-CM | POA: Insufficient documentation

## 2012-08-11 DIAGNOSIS — R110 Nausea: Secondary | ICD-10-CM | POA: Insufficient documentation

## 2012-08-11 DIAGNOSIS — E785 Hyperlipidemia, unspecified: Secondary | ICD-10-CM | POA: Insufficient documentation

## 2012-08-11 DIAGNOSIS — Z79899 Other long term (current) drug therapy: Secondary | ICD-10-CM | POA: Insufficient documentation

## 2012-08-11 DIAGNOSIS — R1031 Right lower quadrant pain: Secondary | ICD-10-CM | POA: Insufficient documentation

## 2012-08-11 DIAGNOSIS — Z8719 Personal history of other diseases of the digestive system: Secondary | ICD-10-CM | POA: Insufficient documentation

## 2012-08-11 DIAGNOSIS — R109 Unspecified abdominal pain: Secondary | ICD-10-CM

## 2012-08-11 DIAGNOSIS — Z794 Long term (current) use of insulin: Secondary | ICD-10-CM | POA: Insufficient documentation

## 2012-08-11 DIAGNOSIS — R1013 Epigastric pain: Secondary | ICD-10-CM | POA: Insufficient documentation

## 2012-08-11 DIAGNOSIS — M109 Gout, unspecified: Secondary | ICD-10-CM | POA: Insufficient documentation

## 2012-08-11 DIAGNOSIS — E119 Type 2 diabetes mellitus without complications: Secondary | ICD-10-CM | POA: Insufficient documentation

## 2012-08-11 LAB — CBC WITH DIFFERENTIAL/PLATELET
Basophils Absolute: 0.1 10*3/uL (ref 0.0–0.1)
Eosinophils Relative: 8 % — ABNORMAL HIGH (ref 0–5)
HCT: 46.9 % (ref 39.0–52.0)
Hemoglobin: 15.5 g/dL (ref 13.0–17.0)
Lymphocytes Relative: 20 % (ref 12–46)
Lymphs Abs: 2 10*3/uL (ref 0.7–4.0)
MCV: 74.9 fL — ABNORMAL LOW (ref 78.0–100.0)
Monocytes Absolute: 0.7 10*3/uL (ref 0.1–1.0)
Monocytes Relative: 7 % (ref 3–12)
Neutro Abs: 6.3 10*3/uL (ref 1.7–7.7)
RBC: 6.26 MIL/uL — ABNORMAL HIGH (ref 4.22–5.81)
WBC: 9.8 10*3/uL (ref 4.0–10.5)

## 2012-08-11 LAB — COMPREHENSIVE METABOLIC PANEL
AST: 17 U/L (ref 0–37)
Albumin: 3.9 g/dL (ref 3.5–5.2)
Chloride: 100 mEq/L (ref 96–112)
Creatinine, Ser: 1.37 mg/dL — ABNORMAL HIGH (ref 0.50–1.35)
Total Bilirubin: 0.3 mg/dL (ref 0.3–1.2)
Total Protein: 7.7 g/dL (ref 6.0–8.3)

## 2012-08-11 LAB — URINALYSIS, ROUTINE W REFLEX MICROSCOPIC
Glucose, UA: NEGATIVE mg/dL
Hgb urine dipstick: NEGATIVE
Leukocytes, UA: NEGATIVE
Protein, ur: NEGATIVE mg/dL
Specific Gravity, Urine: 1.015 (ref 1.005–1.030)
pH: 6 (ref 5.0–8.0)

## 2012-08-11 LAB — LIPASE, BLOOD: Lipase: 23 U/L (ref 11–59)

## 2012-08-11 MED ORDER — HYDROMORPHONE HCL PF 1 MG/ML IJ SOLN: 1.0000 mg | Freq: Once | INTRAMUSCULAR | Status: DC

## 2012-08-11 MED ORDER — PANTOPRAZOLE SODIUM 40 MG IV SOLR
40.0000 mg | Freq: Once | INTRAVENOUS | Status: AC
  Administered 2012-08-11: 40 mg via INTRAVENOUS
  Filled 2012-08-11: qty 40

## 2012-08-11 MED ORDER — ONDANSETRON HCL 4 MG/2ML IJ SOLN
4.0000 mg | Freq: Once | INTRAMUSCULAR | Status: AC
  Administered 2012-08-11: 4 mg via INTRAVENOUS
  Filled 2012-08-11: qty 2

## 2012-08-11 MED ORDER — HYDROMORPHONE HCL PF 1 MG/ML IJ SOLN
1.0000 mg | Freq: Once | INTRAMUSCULAR | Status: AC
  Administered 2012-08-11: 1 mg via INTRAVENOUS
  Filled 2012-08-11: qty 1

## 2012-08-11 MED ORDER — ONDANSETRON 4 MG PO TBDP: 4.0000 mg | ORAL_TABLET | Freq: Once | ORAL | Status: DC

## 2012-08-11 MED ORDER — SODIUM CHLORIDE 0.9 % IV BOLUS (SEPSIS): 1000.0000 mL | Freq: Once | INTRAVENOUS | Status: DC

## 2012-08-11 MED ORDER — IOHEXOL 300 MG/ML  SOLN
100.0000 mL | Freq: Once | INTRAMUSCULAR | Status: AC | PRN
  Administered 2012-08-11: 100 mL via INTRAVENOUS

## 2012-08-11 MED ORDER — HYDROCODONE-ACETAMINOPHEN 5-325 MG PO TABS: 1.0000 | ORAL_TABLET | Freq: Four times a day (QID) | ORAL | Status: AC | PRN

## 2012-08-11 NOTE — ED Provider Notes (Signed)
History   This chart was scribed for Maudry Diego, MD scribed by Mitchell Heir. The patient was seen in room APA19/APA19 at 20:39   CSN: [id number]  Arrival date & time 08/11/12  2024  Chief Complaint  Patient presents with  . Abdominal Pain    (Consider location/radiation/quality/duration/timing/severity/associated sxs/prior treatment) HPI Comments: [name] is a 66 y.o. male who presents to the Emergency Department complaining of constant moderate abd pain at RLQ and epigastrium with associated nausea. Patient has hx of a stroke and is unable to move right upper and lower extremities, and has speech difficulties.   Patient is a 66 y.o. male presenting with abdominal pain. The history is provided by the patient and a relative. No language interpreter was used.  Abdominal Pain The primary symptoms of the illness include abdominal pain and nausea. The primary symptoms of the illness do not include vomiting. The current episode started more than 2 days ago (One week). The onset of the illness was gradual. The problem has not changed since onset. The abdominal pain began 2 days ago. The pain came on gradually. The abdominal pain has been unchanged since its onset. The abdominal pain is generalized. The abdominal pain does not radiate. The severity of the abdominal pain is 6/10. The abdominal pain is relieved by nothing.  Associated with: nothing. The patient states that she believes she is currently not pregnant. The patient has not had a change in bowel habit. Risk factors: stroke. Symptoms associated with the illness do not include chills. Significant associated medical issues include PUD.    Past Medical History  Diagnosis Date  . Barrett's esophagus     EGD 03/23/2011 & EGD 2/09 bx proven  . Steatohepatitis     liver biopsy 2009  . DM (diabetes mellitus)   . Diverticulosis     TCS 03/23/11 pancolonic diverticula &TCS 5/08, pancolonic diverticula  . GERD (gastroesophageal reflux  disease)   . HTN (hypertension)   . CVA (cerebral infarction) 1998    right sided deficit  . Gout   . Lymphoma     XRT at [facility]  . Hepatitis     esosiniphilic, tx with prednisone  . Stroke     1998  . Renal insufficiency   . 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 Hiatal hernia   . Colon polyp 03/23/2011    tubular adenoma, Dr. Gala Romney  . Hemorrhoids, internal 03/23/2011    tcs by Dr. Gala Romney  . Hyperlipidemia   . Lymphoma   . Hemorrhagic colitis 06/06/2012.    Past Surgical History  Procedure Date  . Cholecystectomy   . Right lymph node removal   . Right video-assisted thoracic surgery, pleurectomy, and pleurodesis 2011  . Esophagogastroduodenoscopy 02/05/08    goblet cell metaplasia/negative for H.pylori  . Colonoscopy 03/23/11    Dr. Gala Romney  pancolonic diverticula, hemorrhoids, tubular adenoma.. next tcs 03/2016  . Esophagogastroduodenoscopy 03/23/11    Dr. Gala Romney, barretts, hiatal hernia    Family History  Problem Relation Age of Onset  . Heart failure Father   . Heart failure Mother   . Heart failure Sister   . Heart failure Son     History  Substance Use Topics  . Smoking status: Former Smoker -- 3.0 packs/day    Types: Cigarettes    Quit date: 03/01/1997  . Smokeless tobacco: Never Used  . Alcohol Use: No      Review of Systems  Constitutional: Negative for  chills.  Gastrointestinal: Positive for nausea and abdominal pain. Negative for vomiting.    Allergies  Review of patient's allergies indicates no known allergies.  Home Medications   Current Outpatient Rx  Name Route Sig Dispense Refill  . ALLOPURINOL 300 MG PO TABS Oral Take 300 mg by mouth daily.      [name] AMLODIPINE BESYLATE 10 MG PO TABS Oral Take 1 tablet (10 mg total) by mouth daily. 90 tablet 1  . CLONIDINE HCL 0.2 MG PO TABS Oral Take 1 tablet (0.2 mg total) by mouth 3 (three) times daily. 270 tablet 1  . FEBUXOSTAT 40 MG PO TABS Oral Take 1 tablet (40 mg total) by mouth  daily. 90 tablet 1  . GABAPENTIN 300 MG PO CAPS Oral Take 1-2 capsules (300-600 mg total) by mouth 3 (three) times daily. 1 tablet twice a day and 2 tablets at bedtime 360 capsule 1  . HYDROCHLOROTHIAZIDE 25 MG PO TABS Oral Take 25 mg by mouth daily.      . INSULIN GLARGINE 100 UNIT/ML [place] SOLN Subcutaneous Inject 60 Units into the skin at bedtime.     . INSULIN LISPRO (HUMAN) 100 UNIT/ML [place] SOLN Subcutaneous Inject 14-17 Units into the skin 3 (three) times daily before meals. Uses according to sliding scale at home.    [name] LINAGLIPTIN 5 MG PO TABS Oral Take 5 mg by mouth daily.      [name] LISINOPRIL 20 MG PO TABS Oral Take 20 mg by mouth daily.      [name] METOPROLOL SUCCINATE ER 50 MG PO TB24 Oral Take 50 mg by mouth daily. Take with or immediately following a meal.    . OMEPRAZOLE 40 MG PO CPDR Oral Take 40 mg by mouth daily.    [name] SIMVASTATIN 20 MG PO TABS Oral Take 1 tablet (20 mg total) by mouth at bedtime. 90 tablet 1  . TAMSULOSIN HCL 0.4 MG PO CAPS Oral Take 0.4 mg by mouth daily after breakfast.        BP 141/83  Pulse 75  Temp 98.3 F (36.8 C)  Resp 18  Ht 6' 1"  (1.854 m)  Wt 275 lb (124.739 kg)  BMI 36.28 kg/m2  SpO2 98%  Physical Exam  Constitutional: He is oriented to person, place, and time. He appears well-developed.  HENT:  Head: Normocephalic and atraumatic.  Eyes: Conjunctivae and EOM are normal. No scleral icterus.  Neck: Neck supple. No thyromegaly present.  Cardiovascular: Normal rate and regular rhythm.  Exam reveals no gallop and no friction rub.   No murmur heard. Pulmonary/Chest: No stridor. He has no wheezes. He has no rales. He exhibits no tenderness.  Abdominal: He exhibits no distension. There is tenderness. There is no rebound.       Tenderness to epigastric and RLQ  Musculoskeletal: Normal range of motion. He exhibits no edema.  Lymphadenopathy:    He has no cervical adenopathy.  Neurological: He is alert and oriented to person, place, and time.       Unable  to move right leg or right arm. Slurred speech.  Skin: Skin is warm and dry. No rash noted. No erythema.  Psychiatric: He has a normal mood and affect. His behavior is normal.    ED Course  Procedures (including critical care time) DIAGNOSTIC STUDIES: Oxygen Saturation is 98% on room air, normal by my interpretation.    COORDINATION OF CARE:  20:40: Physical exam started. 20:42: Physical exam completed    Labs Reviewed  COMPREHENSIVE  METABOLIC PANEL - Abnormal; Notable for the following:    Glucose, Bld 138 (*)     Creatinine, Ser 1.37 (*)     Calcium 11.5 (*)     GFR calc non Af Amer 53 (*)     GFR calc Af Amer 61 (*)     All other components within normal limits  CBC WITH DIFFERENTIAL - Abnormal; Notable for the following:    RBC 6.26 (*)     MCV 74.9 (*)     MCH 24.8 (*)     RDW 17.2 (*)     Eosinophils Relative 8 (*)     Eosinophils Absolute 0.8 (*)     All other components within normal limits  LIPASE, BLOOD  URINALYSIS, ROUTINE W REFLEX MICROSCOPIC   No results found.   No diagnosis found.    MDM   The chart was scribed for me under my direct supervision.  I personally performed the history, physical, and medical decision making and all procedures in the evaluation of this patient.Maudry Diego, MD 08/12/12 [phone]

## 2012-08-11 NOTE — ED Notes (Signed)
Patient complaining of abdominal pain off and on x 2 years, worse today. Also complaining of nausea.

## 2012-08-11 NOTE — ED Notes (Signed)
IV access difficult Require several attempts. Pt also c/o nausea and pain MD notified Meds changed to IV due to pt gaining IV access

## 2012-08-12 NOTE — ED Notes (Signed)
Pt stable at discharge pt instructed not to take tylenol with pain medication. All questions answered. Pt verbalizes understanding

## 2012-08-15 ENCOUNTER — Other Ambulatory Visit: Payer: Self-pay

## 2012-08-17 ENCOUNTER — Other Ambulatory Visit: Payer: Self-pay

## 2012-08-17 MED ORDER — SIMVASTATIN 20 MG PO TABS: 20.0000 mg | ORAL_TABLET | Freq: Every day | ORAL | Status: DC

## 2012-08-24 ENCOUNTER — Ambulatory Visit (INDEPENDENT_AMBULATORY_CARE_PROVIDER_SITE_OTHER): Payer: Medicare Other | Admitting: Family Medicine

## 2012-08-24 ENCOUNTER — Encounter: Payer: Self-pay | Admitting: Family Medicine

## 2012-08-24 VITALS — BP 126/72 | HR 73 | Resp 18 | Ht 72.0 in

## 2012-08-24 DIAGNOSIS — Z23 Encounter for immunization: Secondary | ICD-10-CM

## 2012-08-24 DIAGNOSIS — B372 Candidiasis of skin and nail: Secondary | ICD-10-CM

## 2012-08-24 DIAGNOSIS — R609 Edema, unspecified: Secondary | ICD-10-CM

## 2012-08-24 DIAGNOSIS — R600 Localized edema: Secondary | ICD-10-CM

## 2012-08-24 DIAGNOSIS — G8929 Other chronic pain: Secondary | ICD-10-CM

## 2012-08-24 DIAGNOSIS — R109 Unspecified abdominal pain: Secondary | ICD-10-CM

## 2012-08-24 DIAGNOSIS — M542 Cervicalgia: Secondary | ICD-10-CM

## 2012-08-24 DIAGNOSIS — E119 Type 2 diabetes mellitus without complications: Secondary | ICD-10-CM

## 2012-08-24 DIAGNOSIS — I10 Essential (primary) hypertension: Secondary | ICD-10-CM

## 2012-08-24 MED ORDER — FEBUXOSTAT 40 MG PO TABS: 40.0000 mg | ORAL_TABLET | Freq: Every day | ORAL | Status: DC

## 2012-08-24 MED ORDER — HYDROCODONE-ACETAMINOPHEN 7.5-500 MG PO TABS: 1.0000 | ORAL_TABLET | Freq: Two times a day (BID) | ORAL | Status: DC | PRN

## 2012-08-24 MED ORDER — CLOTRIMAZOLE-BETAMETHASONE 1-0.05 % EX CREA: TOPICAL_CREAM | CUTANEOUS | Status: AC

## 2012-08-24 MED ORDER — LINAGLIPTIN 5 MG PO TABS: 5.0000 mg | ORAL_TABLET | Freq: Every day | ORAL | Status: DC

## 2012-08-24 NOTE — Patient Instructions (Signed)
Use the cream twice a day beneath the arm Ted hose sent to [employer] shot given Pain medication to be used twice a day F/U 3 months

## 2012-08-26 ENCOUNTER — Encounter: Payer: Self-pay | Admitting: Family Medicine

## 2012-08-26 DIAGNOSIS — R109 Unspecified abdominal pain: Secondary | ICD-10-CM | POA: Insufficient documentation

## 2012-08-26 DIAGNOSIS — G8929 Other chronic pain: Secondary | ICD-10-CM | POA: Insufficient documentation

## 2012-08-26 DIAGNOSIS — M542 Cervicalgia: Secondary | ICD-10-CM | POA: Insufficient documentation

## 2012-08-26 DIAGNOSIS — B372 Candidiasis of skin and nail: Secondary | ICD-10-CM | POA: Insufficient documentation

## 2012-08-26 NOTE — Assessment & Plan Note (Signed)
Decreased ROM, likley OA set in, no further work-up meds given

## 2012-08-26 NOTE — Assessment & Plan Note (Signed)
Venous statis, try ted hose, I think compression hose will be too difficult for pt

## 2012-08-26 NOTE — Assessment & Plan Note (Signed)
Labs have been improving, medications refilled, f/u endocrine

## 2012-08-26 NOTE — Assessment & Plan Note (Addendum)
lotrisone cream for irritated skin and yeast , wife to try to use towel or pillow to keep arm up some to allow air drying

## 2012-08-26 NOTE — Progress Notes (Signed)
  Subjective:    Patient ID: [name], male    DOB: June 29, 1946, 66 y.o.   MRN: [id number]  HPI Pt here to f/u chronic medical problems.Being followed by endocrine for DM. He has a rash beneath right axilla on side of hemiparesis, wife states he sweats and it rubs. He has continued abdominal pain which is unchanged, but he is out of pain medication Neck pain on and off, no injury   Review of Systems - per above  GEN- denies fatigue, fever, weight loss,weakness, recent illness HEENT- denies eye drainage, change in vision, nasal discharge, CVS- denies chest pain, palpitations RESP- denies SOB, cough, wheeze ABD- denies N/V, change in stools,+ abd pain GU- denies dysuria, hematuria, dribbling, incontinence MSK- + joint pain, muscle aches, injury Neuro- denies headache, dizziness, syncope, seizure activity      Objective:   Physical Exam GEN- NAD, alert and oriented x3, sitting in wheelchair, obese HEENT- EOMI, non injected sclera, pink conjunctiva, MMM, oropharynx clear, CVS- RRR, no murmur RESP-CTAB ABD- NABS, soft, mild TTP over RUQ and epigastric, ND, no rebound, no gaurding  EXT-1+ edema Pulses- Radial, DP- 2+ Neuro- right hemiparesis,  Skin- erythema beneath axilla, no discharge, mild odor, small crack in skin no bleeding at corner of axilla.       Assessment & Plan:

## 2012-08-26 NOTE — Assessment & Plan Note (Signed)
Well controlled 

## 2012-08-26 NOTE — Assessment & Plan Note (Signed)
Refilled pain meds, no change in exam or pattern

## 2012-09-10 ENCOUNTER — Other Ambulatory Visit: Payer: Self-pay

## 2012-09-10 MED ORDER — HYDROCODONE-ACETAMINOPHEN 7.5-500 MG PO TABS: 1.0000 | ORAL_TABLET | Freq: Two times a day (BID) | ORAL | Status: AC | PRN

## 2012-09-11 ENCOUNTER — Telehealth: Payer: Self-pay | Admitting: Family Medicine

## 2012-09-11 NOTE — Telephone Encounter (Signed)
Needs for dr. Dorris Fetch to refill.  Will notify wife

## 2012-09-13 ENCOUNTER — Telehealth: Payer: Self-pay

## 2012-09-13 NOTE — Telephone Encounter (Signed)
She said they got it from [place] (thinks 10+ years ago) It is motorized with a joystick

## 2012-09-13 NOTE — Telephone Encounter (Signed)
Please ask wife where he received his wheelchair from and about how long he has had it. I can send an order for a new chair.  Please ask type of chair- I believe it is the motorized with joystick

## 2012-09-13 NOTE — Telephone Encounter (Signed)
Order written

## 2012-09-13 NOTE — Telephone Encounter (Signed)
NP from [place] was out doing his yearly assessment and he has a place on his right lateral calf that hits against a part of the wheelchair and is bruises and leaves an indention and she is concerned it is at high risk for breakdown. Said he needs to be fitted for a new wheelchair.  [phone]

## 2012-09-14 NOTE — Telephone Encounter (Signed)
Called and left message for pt to return call.  

## 2012-09-17 ENCOUNTER — Other Ambulatory Visit: Payer: Self-pay

## 2012-09-17 MED ORDER — INSULIN GLARGINE 100 UNIT/ML ~~LOC~~ SOLN: 60.0000 [IU] | Freq: Every day | SUBCUTANEOUS | Status: DC

## 2012-09-17 NOTE — Telephone Encounter (Signed)
Noted.  Informed wife that his diabetic meds need to be refilled through Dr. Liliane Channel office. Will refill one time.

## 2012-09-25 ENCOUNTER — Telehealth: Payer: Self-pay | Admitting: Family Medicine

## 2012-09-28 ENCOUNTER — Other Ambulatory Visit: Payer: Self-pay | Admitting: Family Medicine

## 2012-09-28 NOTE — Telephone Encounter (Signed)
Clarified with April LPN at Dr. Liliane Channel office that patient should be taking 60 units of Lantus at bedtime only.

## 2012-09-28 NOTE — Telephone Encounter (Signed)
Courtney called in the lantus but he is supposed to be seeing Dr Dorris Fetch for his diabetes

## 2012-10-04 ENCOUNTER — Ambulatory Visit (INDEPENDENT_AMBULATORY_CARE_PROVIDER_SITE_OTHER): Payer: Medicare Other | Admitting: Family Medicine

## 2012-10-04 ENCOUNTER — Encounter: Payer: Self-pay | Admitting: Family Medicine

## 2012-10-04 VITALS — BP 126/68 | HR 66 | Resp 18

## 2012-10-04 DIAGNOSIS — L03319 Cellulitis of trunk, unspecified: Secondary | ICD-10-CM

## 2012-10-04 DIAGNOSIS — L0292 Furuncle, unspecified: Secondary | ICD-10-CM

## 2012-10-04 DIAGNOSIS — L02219 Cutaneous abscess of trunk, unspecified: Secondary | ICD-10-CM

## 2012-10-04 DIAGNOSIS — S34112A 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, initial encounter: Secondary | ICD-10-CM

## 2012-10-04 DIAGNOSIS — L0293 Carbuncle, unspecified: Secondary | ICD-10-CM

## 2012-10-04 DIAGNOSIS — G8929 Other chronic pain: Secondary | ICD-10-CM

## 2012-10-04 DIAGNOSIS — L02212 Cutaneous abscess of back [any part, except buttock]: Secondary | ICD-10-CM

## 2012-10-04 DIAGNOSIS — R109 Unspecified abdominal pain: Secondary | ICD-10-CM

## 2012-10-04 DIAGNOSIS — L720 Epidermal cyst: Secondary | ICD-10-CM

## 2012-10-04 DIAGNOSIS — R220 Localized swelling, mass and lump, head: Secondary | ICD-10-CM

## 2012-10-04 DIAGNOSIS — L723 Sebaceous cyst: Secondary | ICD-10-CM

## 2012-10-04 DIAGNOSIS — R221 Localized swelling, mass and lump, neck: Secondary | ICD-10-CM

## 2012-10-04 MED ORDER — OXYCODONE-ACETAMINOPHEN 5-325 MG PO TABS: 1.0000 | ORAL_TABLET | Freq: Three times a day (TID) | ORAL | Status: AC | PRN

## 2012-10-04 MED ORDER — SULFAMETHOXAZOLE-TRIMETHOPRIM 800-160 MG PO TABS: 1.0000 | ORAL_TABLET | Freq: Two times a day (BID) | ORAL | Status: DC

## 2012-10-04 NOTE — Progress Notes (Signed)
  Subjective:    Patient ID: [name], male    DOB: 01-05-1946, 66 y.o.   MRN: [id number]  HPI Patient here for acute visit. His back pain and stomach pain or not controlled with the Lortab. His wife is also noticed that he has a few more spots on his back one in the middle which is causing discomfort. She also noticed a knot under his chin. She states that he had a small one day her for some time the past 2 weeks it is been sticking out. He denies discomfort in the area, he does have history of lymphoma   Review of Systems  GEN- denies fatigue, fever, weight loss,weakness, recent illness HEENT- denies eye drainage, change in vision, nasal discharge, CVS- denies chest pain, palpitations RESP- denies SOB, cough, wheeze ABD- denies N/V, change in stools, abd pain GU- denies dysuria, hematuria, dribbling, incontinence MSK- +joint pain, muscle aches, injury Neuro- denies headache, dizziness, syncope, seizure activity      Objective:   Physical Exam GEN- NAD, alert and oriented x3, sitting in wheelchair, obese HEENT- EOMI, non injected sclera, pink conjunctiva, MMM, oropharynx clear, CVS- RRR, no murmur RESP-CTAB ABD- NABS, soft, mild TTP over RUQ and epigastric, ND, no rebound, no gaurding  EXT-1+ edema Pulses- Radial, DP- 2+ Neuro- right hemiparesis,  Skin- large 3 x4 cm fluctuant lesion on mid back, TTP , +warmth mild erythema, 2 small cyst one upper left shoulder, 1 lower left back non tender, no fluctant, right jawline near ear 2cm nodule, non tender non fluctant, +localized swelling       Assessment & Plan:

## 2012-10-04 NOTE — Patient Instructions (Addendum)
Ultrasound of neck to be done Continue current meds New antibiotic for the infection on your back  Warm compresses to area Referral to surgeon to have spots removed New pain pill Percocet  Keep previous f/u appointment

## 2012-10-05 ENCOUNTER — Ambulatory Visit (HOSPITAL_COMMUNITY)
Admission: RE | Admit: 2012-10-05 | Discharge: 2012-10-05 | Disposition: A | Discharge status: Home / Self Care | Payer: Medicare Other | Source: Ambulatory Visit | Attending: Family Medicine | Admitting: Family Medicine

## 2012-10-05 ENCOUNTER — Other Ambulatory Visit: Payer: Self-pay | Admitting: Family Medicine

## 2012-10-05 DIAGNOSIS — Z87898 Personal history of other specified conditions: Secondary | ICD-10-CM | POA: Insufficient documentation

## 2012-10-05 DIAGNOSIS — R221 Localized swelling, mass and lump, neck: Secondary | ICD-10-CM

## 2012-10-05 DIAGNOSIS — R220 Localized swelling, mass and lump, head: Secondary | ICD-10-CM | POA: Insufficient documentation

## 2012-10-05 DIAGNOSIS — L720 Epidermal cyst: Secondary | ICD-10-CM | POA: Insufficient documentation

## 2012-10-05 NOTE — Assessment & Plan Note (Signed)
Pain med changed to percocet

## 2012-10-05 NOTE — Assessment & Plan Note (Signed)
Nodules felt a dull one is very concerning. Ultrasound reviewed and concerning for mass versus abnormal lymph node biopsy is needed therefore will send him to ear nose and throat to have this done. He had a remote treatment at [facility] for lymphoma in the past

## 2012-10-05 NOTE — Assessment & Plan Note (Signed)
He has a good-sized lesion in the center of his back. I will start him on antibiotics he will need general surgery to have this abscess drained, urgent appt scheduled

## 2012-10-05 NOTE — Assessment & Plan Note (Signed)
Refer to general surgery to have these removed

## 2012-10-11 ENCOUNTER — Ambulatory Visit (INDEPENDENT_AMBULATORY_CARE_PROVIDER_SITE_OTHER): Payer: Medicare Other | Admitting: Otolaryngology

## 2012-10-11 DIAGNOSIS — D487 Neoplasm of uncertain behavior of other specified sites: Secondary | ICD-10-CM

## 2012-10-11 DIAGNOSIS — R221 Localized swelling, mass and lump, neck: Secondary | ICD-10-CM

## 2012-10-12 ENCOUNTER — Other Ambulatory Visit: Payer: Self-pay | Admitting: Otolaryngology

## 2012-10-18 ENCOUNTER — Telehealth: Payer: Self-pay | Admitting: Family Medicine

## 2012-10-19 ENCOUNTER — Other Ambulatory Visit: Payer: Self-pay

## 2012-10-19 MED ORDER — TAMSULOSIN HCL 0.4 MG PO CAPS: 0.4000 mg | ORAL_CAPSULE | Freq: Every day | ORAL | Status: DC

## 2012-10-19 MED ORDER — METOPROLOL SUCCINATE ER 50 MG PO TB24: 50.0000 mg | ORAL_TABLET | Freq: Every day | ORAL | Status: DC

## 2012-10-19 NOTE — Telephone Encounter (Signed)
meds refilled to mail order.  Checked on percocet refill and this rx was last given on 10/24.  Patient will have to wait for refill.  Called and left message for wife to return call.

## 2012-10-22 ENCOUNTER — Encounter (HOSPITAL_COMMUNITY): Payer: Self-pay | Admitting: Pharmacy Technician

## 2012-10-22 ENCOUNTER — Telehealth: Payer: Self-pay | Admitting: Family Medicine

## 2012-10-22 MED ORDER — OXYCODONE-ACETAMINOPHEN 5-325 MG PO TABS: ORAL_TABLET | ORAL | Status: DC

## 2012-10-22 NOTE — Telephone Encounter (Signed)
Percocet refilled, pt may fill early, if needed pharmacy can call us to verify

## 2012-10-22 NOTE — Telephone Encounter (Signed)
Wife stated that she cannot find percocet bottle she thinks that she may have left it at another doctors office.  She understands that insurance will not cover it being this early and is asking if maybe something else can be called in for his pain until it is time to get a refill on the percocet which is due 11/24

## 2012-10-22 NOTE — Telephone Encounter (Signed)
Wife aware

## 2012-10-23 ENCOUNTER — Telehealth: Payer: Self-pay

## 2012-10-23 MED ORDER — HYDROCODONE-ACETAMINOPHEN 10-325 MG PO TABS: 1.0000 | ORAL_TABLET | Freq: Four times a day (QID) | ORAL | Status: DC | PRN

## 2012-10-23 NOTE — Telephone Encounter (Signed)
Vicodin sent, pt can then pick up his regular medication on December 1st

## 2012-10-23 NOTE — Telephone Encounter (Signed)
Patient aware.

## 2012-10-23 NOTE — Telephone Encounter (Signed)
[facility] refused to fill the oxycodone early. Will fill hydrocodone today if faxed in

## 2012-10-25 ENCOUNTER — Encounter (HOSPITAL_COMMUNITY)
Admission: RE | Admit: 2012-10-25 | Discharge: 2012-10-25 | Disposition: A | Discharge status: Home / Self Care | Payer: Medicare Other | Source: Ambulatory Visit | Attending: Otolaryngology | Admitting: Otolaryngology

## 2012-10-25 ENCOUNTER — Encounter (HOSPITAL_COMMUNITY): Payer: Self-pay

## 2012-10-25 HISTORY — DX: Unspecified glaucoma: H40.9

## 2012-10-25 HISTORY — DX: Unspecified osteoarthritis, unspecified site: M19.90

## 2012-10-25 LAB — COMPREHENSIVE METABOLIC PANEL
ALT: 18 U/L (ref 0–53)
AST: 15 U/L (ref 0–37)
Alkaline Phosphatase: 92 U/L (ref 39–117)
CO2: 28 mEq/L (ref 19–32)
Chloride: 97 mEq/L (ref 96–112)
GFR calc Af Amer: 66 mL/min — ABNORMAL LOW (ref >90)
GFR calc non Af Amer: 57 mL/min — ABNORMAL LOW (ref >90)
Glucose, Bld: 226 mg/dL — ABNORMAL HIGH (ref 70–99)
Potassium: 4.3 mEq/L (ref 3.5–5.1)
Sodium: 135 mEq/L (ref 135–145)
Total Bilirubin: 0.3 mg/dL (ref 0.3–1.2)

## 2012-10-25 LAB — CBC
Hemoglobin: 14.7 g/dL (ref 13.0–17.0)
MCH: 24.7 pg — ABNORMAL LOW (ref 26.0–34.0)
Platelets: 208 10*3/uL (ref 150–400)
RBC: 5.96 MIL/uL — ABNORMAL HIGH (ref 4.22–5.81)
WBC: 8 10*3/uL (ref 4.0–10.5)

## 2012-10-25 NOTE — Progress Notes (Signed)
10/25/12 1043  OBSTRUCTIVE SLEEP APNEA  Have you ever been diagnosed with sleep apnea through a sleep study? No  Do you snore loudly (loud enough to be heard through closed doors)?  1  Do you often feel tired, fatigued, or sleepy during the daytime? 1  Has anyone observed you stop breathing during your sleep? 0  Do you have, or are you being treated for high blood pressure? 1  BMI more than 35 kg/m2? 0  Age over 66 years old? 1  Neck circumference greater than 40 cm/18 inches? 0  Gender: 1  Obstructive Sleep Apnea Score 5   Score 4 or greater  Results sent to PCP

## 2012-10-25 NOTE — Patient Instructions (Addendum)
Your procedure is scheduled on:  11/01/12  Report to [facility] at     6:15  AM.  Call this number if you have problems the morning of surgery: [phone]   Remember:   Do not drink or eat food:After Midnight.    Take these medicines the morning of surgery with A SIP OF WATER: lisinopril, neurontin,  norvasc, catapres,  Percocet, prilosec   Do not wear jewelry, make-up or nail polish.  Do not wear lotions, powders, deodorant, or perfumes.  Do not shave 48 hours prior to surgery. Men may shave face and neck.  Do not bring valuables to the hospital.  Contacts, dentures or bridgework may not be worn into surgery.  Leave suitcase in the car. After surgery it may be brought to your room.  For patients admitted to the hospital, checkout time is 11:00 AM the day of discharge.   Patients discharged the day of surgery will not be allowed to drive home.   Please read over the following fact sheets that you were given: Pain Booklet, MRSA Information, Surgical Site Infection Prevention, Anesthesia Post-op Instructions and Care and Recovery After Surgery  PATIENT INSTRUCTIONS POST-ANESTHESIA  IMMEDIATELY FOLLOWING SURGERY:  Do not drive or operate machinery for the first twenty four hours after surgery.  Do not make any important decisions for twenty four hours after surgery or while taking narcotic pain medications or sedatives.  If you develop intractable nausea and vomiting or a severe headache please notify your doctor immediately.  FOLLOW-UP:  Please make an appointment with your surgeon as instructed. You do not need to follow up with anesthesia unless specifically instructed to do so.  WOUND CARE INSTRUCTIONS (if applicable):  Keep a dry clean dressing on the anesthesia/puncture wound site if there is drainage.  Once the wound has quit draining you may leave it open to air.  Generally you should leave the bandage intact for twenty four hours unless there is drainage.  If the epidural site  drains for more than 36-48 hours please call the anesthesia department.  QUESTIONS?:  Please feel free to call your physician or the hospital operator if you have any questions, and they will be happy to assist you.     Cyst Removal Your caregiver has removed a cyst. A cyst is a sac containing a semi-solid material. Cysts may occur any place on your body. They may remain small for years or gradually get larger. A sebaceous cyst is an enlarged (dilated) sweat gland filled with old sweat (sebum). Unattended, these may become large (the size of a softball) over several years time. These are often removed for improved appearance (cosmetic) reasons or before they become infected to form an abscess. An abscess is an infected cyst. HOME CARE INSTRUCTIONS   Keep your bandage clean and dry. You may change your bandage after 24 hours. If your bandage sticks, use warm water to gently loosen it. Pat the area dry with a clean towel before putting on another bandage.  If possible, keep the area where the cyst was removed raised to relieve soreness, swelling, and promote healing.  If you have stitches, keep them clean and dry.  You may clean your stitches gently with a cotton swab dipped in warm soapy water.  Do not soak the area where the cyst was removed or go swimming. You may shower.  Do not overuse the area where your cyst was removed.  Return in 7 days or as directed to have your stitches  removed.  Take medicines as instructed by your caregiver. SEEK IMMEDIATE MEDICAL CARE IF:   An oral temperature above 102 F (38.9 C) develops, not controlled by medication.  Blood continues to soak through the bandage.  You have increasing pain in the area where your cyst was removed.  You have redness, swelling, pus, a bad smell, soreness (inflammation), or red streaks coming away from the stitches. These are signs of infection. MAKE SURE YOU:   Understand these instructions.  Will watch your  condition.  Will get help right away if you are not doing well or get worse. Document Released: 11/25/2000 Document Revised: 02/20/2012 Document Reviewed: 03/20/2008 [facility] Patient Information 2013 [place].

## 2012-11-01 ENCOUNTER — Encounter (HOSPITAL_COMMUNITY)
Admission: RE | Disposition: A | Discharge status: Home / Self Care | Payer: Self-pay | Source: Ambulatory Visit | Attending: Otolaryngology

## 2012-11-01 ENCOUNTER — Encounter (HOSPITAL_COMMUNITY): Payer: Self-pay | Admitting: *Deleted

## 2012-11-01 ENCOUNTER — Encounter (HOSPITAL_COMMUNITY): Payer: Self-pay | Admitting: Anesthesiology

## 2012-11-01 ENCOUNTER — Ambulatory Visit (HOSPITAL_COMMUNITY): Payer: Medicare Other | Admitting: Anesthesiology

## 2012-11-01 ENCOUNTER — Ambulatory Visit (HOSPITAL_COMMUNITY)
Admission: RE | Admit: 2012-11-01 | Discharge: 2012-11-01 | Disposition: A | Discharge status: Home / Self Care | Payer: Medicare Other | Source: Ambulatory Visit | Attending: Otolaryngology | Admitting: Otolaryngology

## 2012-11-01 DIAGNOSIS — Z01812 Encounter for preprocedural laboratory examination: Secondary | ICD-10-CM | POA: Insufficient documentation

## 2012-11-01 DIAGNOSIS — I10 Essential (primary) hypertension: Secondary | ICD-10-CM | POA: Insufficient documentation

## 2012-11-01 DIAGNOSIS — E119 Type 2 diabetes mellitus without complications: Secondary | ICD-10-CM | POA: Insufficient documentation

## 2012-11-01 DIAGNOSIS — C760 Malignant neoplasm of head, face and neck: Secondary | ICD-10-CM | POA: Insufficient documentation

## 2012-11-01 DIAGNOSIS — R221 Localized swelling, mass and lump, neck: Secondary | ICD-10-CM

## 2012-11-01 HISTORY — PX: MASS BIOPSY: SHX5445

## 2012-11-01 LAB — GLUCOSE, CAPILLARY: Glucose-Capillary: 162 mg/dL — ABNORMAL HIGH (ref 70–99)

## 2012-11-01 SURGERY — BIOPSY, MASS, NECK
Anesthesia: General | Site: Neck | Laterality: Right | Wound class: Contaminated

## 2012-11-01 MED ORDER — LIDOCAINE-EPINEPHRINE (PF) 1 %-1:200000 IJ SOLN
INTRAMUSCULAR | Status: AC
  Filled 2012-11-01: qty 10

## 2012-11-01 MED ORDER — LIDOCAINE-EPINEPHRINE (PF) 1 %-1:200000 IJ SOLN
INTRAMUSCULAR | Status: DC | PRN
  Administered 2012-11-01: 60 mL

## 2012-11-01 MED ORDER — SUCCINYLCHOLINE CHLORIDE 20 MG/ML IJ SOLN
INTRAMUSCULAR | Status: AC
  Filled 2012-11-01: qty 1

## 2012-11-01 MED ORDER — MIDAZOLAM HCL 2 MG/2ML IJ SOLN
INTRAMUSCULAR | Status: AC
  Filled 2012-11-01: qty 2

## 2012-11-01 MED ORDER — ROCURONIUM BROMIDE 50 MG/5ML IV SOLN
INTRAVENOUS | Status: AC
  Filled 2012-11-01: qty 1

## 2012-11-01 MED ORDER — ONDANSETRON HCL 4 MG/2ML IJ SOLN
INTRAMUSCULAR | Status: AC
  Filled 2012-11-01: qty 2

## 2012-11-01 MED ORDER — OXYCODONE-ACETAMINOPHEN 5-325 MG PO TABS: 1.0000 | ORAL_TABLET | ORAL | Status: DC | PRN

## 2012-11-01 MED ORDER — AMOXICILLIN-POT CLAVULANATE 875-125 MG PO TABS: 1.0000 | ORAL_TABLET | Freq: Two times a day (BID) | ORAL | Status: AC

## 2012-11-01 MED ORDER — HEMOSTATIC AGENTS (NO CHARGE) OPTIME
TOPICAL | Status: DC | PRN
  Administered 2012-11-01: 1 via TOPICAL

## 2012-11-01 MED ORDER — PROPOFOL 10 MG/ML IV EMUL
INTRAVENOUS | Status: AC
  Filled 2012-11-01: qty 20

## 2012-11-01 MED ORDER — FENTANYL CITRATE 0.05 MG/ML IJ SOLN
INTRAMUSCULAR | Status: AC
  Filled 2012-11-01: qty 5

## 2012-11-01 MED ORDER — ONDANSETRON HCL 4 MG/2ML IJ SOLN
4.0000 mg | Freq: Once | INTRAMUSCULAR | Status: AC
  Administered 2012-11-01: 4 mg via INTRAVENOUS

## 2012-11-01 MED ORDER — ONDANSETRON HCL 4 MG/2ML IJ SOLN: 4.0000 mg | Freq: Once | INTRAMUSCULAR | Status: DC | PRN

## 2012-11-01 MED ORDER — FENTANYL CITRATE 0.05 MG/ML IJ SOLN: 25.0000 ug | INTRAMUSCULAR | Status: DC | PRN

## 2012-11-01 MED ORDER — MIDAZOLAM HCL 2 MG/2ML IJ SOLN
1.0000 mg | INTRAMUSCULAR | Status: DC | PRN
  Administered 2012-11-01: 2 mg via INTRAVENOUS

## 2012-11-01 MED ORDER — FENTANYL CITRATE 0.05 MG/ML IJ SOLN
INTRAMUSCULAR | Status: DC | PRN
  Administered 2012-11-01 (×2): 50 ug via INTRAVENOUS

## 2012-11-01 MED ORDER — LACTATED RINGERS IV SOLN
INTRAVENOUS | Status: DC
  Administered 2012-11-01: 08:00:00 via INTRAVENOUS

## 2012-11-01 MED ORDER — LIDOCAINE-EPINEPHRINE (PF) 1 %-1:200000 IJ SOLN
INTRAMUSCULAR | Status: DC | PRN
  Administered 2012-11-01: 30 mL

## 2012-11-01 MED ORDER — 0.9 % SODIUM CHLORIDE (POUR BTL) OPTIME
TOPICAL | Status: DC | PRN
  Administered 2012-11-01: 1000 mL

## 2012-11-01 MED ORDER — LIDOCAINE HCL (PF) 1 % IJ SOLN
INTRAMUSCULAR | Status: AC
  Filled 2012-11-01: qty 5

## 2012-11-01 SURGICAL SUPPLY — 60 items
ADH SKN CLS APL DERMABOND .7 (GAUZE/BANDAGES/DRESSINGS) ×2
BAG HAMPER (MISCELLANEOUS) ×2 IMPLANT
BLADE HEX COATED 2.75 (ELECTRODE) ×1 IMPLANT
BLADE SURG 15 STRL LF DISP TIS (BLADE) ×1 IMPLANT
BLADE SURG 15 STRL SS (BLADE) ×2
CLOTH BEACON ORANGE TIMEOUT ST (SAFETY) ×2 IMPLANT
COAGULATOR SUCT SWTCH 10FR 6 (ELECTROSURGICAL) ×1 IMPLANT
COVER SURGICAL LIGHT HANDLE (MISCELLANEOUS) ×3 IMPLANT
DERMABOND ADVANCED (GAUZE/BANDAGES/DRESSINGS) ×2
DERMABOND ADVANCED .7 DNX12 (GAUZE/BANDAGES/DRESSINGS) ×1 IMPLANT
DRAPE PROXIMA HALF (DRAPES) ×2 IMPLANT
DRESSING TELFA 8X3 (GAUZE/BANDAGES/DRESSINGS) ×1 IMPLANT
ELECT COATED BLADE 2.86 ST (ELECTRODE) ×1 IMPLANT
ELECT NDL BLADE 2-5/6 (NEEDLE) IMPLANT
ELECT NDL TIP 2.8 STRL (NEEDLE) ×1 IMPLANT
ELECT NEEDLE BLADE 2-5/6 (NEEDLE) ×2 IMPLANT
ELECT NEEDLE TIP 2.8 STRL (NEEDLE) IMPLANT
ELECT REM PT RETURN 9FT ADLT (ELECTROSURGICAL) ×2
ELECTRODE REM PT RTRN 9FT ADLT (ELECTROSURGICAL) ×1 IMPLANT
EVACUATOR DRAINAGE 10X20 100CC (DRAIN) ×1 IMPLANT
EVACUATOR SILICONE 100CC (DRAIN)
FORMALIN 10 PREFIL 120ML (MISCELLANEOUS) ×2 IMPLANT
GAUZE SPONGE 4X4 16PLY XRAY LF (GAUZE/BANDAGES/DRESSINGS) ×2 IMPLANT
GLOVE BIO SURGEON STRL SZ7.5 (GLOVE) ×2 IMPLANT
GLOVE BIOGEL PI IND STRL 6.5 (GLOVE) IMPLANT
GLOVE BIOGEL PI INDICATOR 6.5 (GLOVE) ×1
GLOVE INDICATOR 7.0 STRL GRN (GLOVE) ×1 IMPLANT
GLOVE SS BIOGEL STRL SZ 6.5 (GLOVE) IMPLANT
GLOVE SUPERSENSE BIOGEL SZ 6.5 (GLOVE) ×1
GOWN STRL REIN XL XLG (GOWN DISPOSABLE) ×6 IMPLANT
HEMOSTAT SURGICEL 4X8 (HEMOSTASIS) ×1 IMPLANT
KIT BLADEGUARD II DBL (SET/KITS/TRAYS/PACK) ×2 IMPLANT
KIT ROOM TURNOVER APOR (KITS) ×2 IMPLANT
LOCATOR NERVE 3 VOLT (DISPOSABLE) ×1 IMPLANT
MANIFOLD NEPTUNE II (INSTRUMENTS) ×2 IMPLANT
MARKER SKIN DUAL TIP RULER LAB (MISCELLANEOUS) ×2 IMPLANT
NDL HYPO 18GX1.5 BLUNT FILL (NEEDLE) IMPLANT
NDL HYPO 25X1 1.5 SAFETY (NEEDLE) ×1 IMPLANT
NEEDLE HYPO 18GX1.5 BLUNT FILL (NEEDLE) ×4 IMPLANT
NEEDLE HYPO 25X1 1.5 SAFETY (NEEDLE) ×4 IMPLANT
NS IRRIG 1000ML POUR BTL (IV SOLUTION) ×2 IMPLANT
PAD ARMBOARD 7.5X6 YLW CONV (MISCELLANEOUS) ×2 IMPLANT
PATTIES SURGICAL .5 X1 (DISPOSABLE) ×1 IMPLANT
PENCIL FOOT CONTROL (ELECTRODE) ×1 IMPLANT
PENCIL HANDSWITCHING (ELECTRODE) ×3 IMPLANT
SET BASIN LINEN APH (SET/KITS/TRAYS/PACK) ×2 IMPLANT
SHEARS HARMONIC 9CM CVD (BLADE) ×1 IMPLANT
STAPLER VISISTAT 35W (STAPLE) ×1 IMPLANT
SUT SILK 2 0 (SUTURE)
SUT SILK 2 0 SH CR/8 (SUTURE) ×1 IMPLANT
SUT SILK 2-0 18XBRD TIE 12 (SUTURE) ×1 IMPLANT
SUT SILK 3 0 (SUTURE)
SUT SILK 3-0 18XBRD TIE 12 (SUTURE) ×1 IMPLANT
SUT VIC AB 4-0 PS2 27 (SUTURE) ×2 IMPLANT
SYR BULB 3OZ (MISCELLANEOUS) ×2 IMPLANT
SYRINGE 10CC LL (SYRINGE) ×2 IMPLANT
TRAY ENT MC OR (CUSTOM PROCEDURE TRAY) ×1 IMPLANT
TUBE SALEM SUMP 12R W/ARV (TUBING) ×1 IMPLANT
WATER STERILE IRR 250ML POUR (IV SOLUTION) ×1 IMPLANT
YANKAUER SUCT BULB TIP 10FT TU (MISCELLANEOUS) ×2 IMPLANT

## 2012-11-01 NOTE — Anesthesia Preprocedure Evaluation (Addendum)
Anesthesia Evaluation  Patient identified by MRN, date of birth, ID band Patient awake    Reviewed: Allergy & Precautions, H&P , NPO status , Patient's Chart, lab work & pertinent test results, reviewed documented beta blocker date and time   Airway Mallampati: II TM Distance: >3 FB     Dental  (+) Teeth Intact   Pulmonary neg pulmonary ROS,  breath sounds clear to auscultation        Cardiovascular hypertension, Pt. on medications and Pt. on home beta blockers - anginaRhythm:Regular Rate:Normal     Neuro/Psych CVA (R hemiparesis), Residual Symptoms    GI/Hepatic hiatal hernia, GERD-  Medicated and Controlled,  Endo/Other  diabetes, Well Controlled, Type 2, Insulin Dependent  Renal/GU Renal InsufficiencyRenal disease     Musculoskeletal   Abdominal   Peds  Hematology   Anesthesia Other Findings   Reproductive/Obstetrics                           Anesthesia Physical Anesthesia Plan  ASA: III  Anesthesia Plan: General   Post-op Pain Management:    Induction: Intravenous, Rapid sequence and Cricoid pressure planned  Airway Management Planned: Oral ETT  Additional Equipment:   Intra-op Plan:   Post-operative Plan: Extubation in OR  Informed Consent: I have reviewed the patients History and Physical, chart, labs and discussed the procedure including the risks, benefits and alternatives for the proposed anesthesia with the patient or authorized representative who has indicated his/her understanding and acceptance.     Plan Discussed with:   Anesthesia Plan Comments: (10/01/12  0935--general anesthesia planned.  Back to OR with #18 IV in Lt forearm, but noted to be infiltrating prior to induction.  IV assessed preoperatively and appeared ok with good flow.  Placed with ultrasound by pre op nurse.  After multiple attempts in the OR by Dr. Patsey Berthold and Dr. Benjamine Mola, Dr. Benjamine Mola requested proceeding  with mac/local anesthesia.  Biopsy obtained and patient tolerated well.  To short stay for post op care.  )       Anesthesia Quick Evaluation

## 2012-11-01 NOTE — H&P (Signed)
  H&P Update  Pt's original H&P dated 10/11/12 reviewed and placed in chart (to be scanned).  I personally examined the patient today.  No change in health. Proceed with biopsy of the right neck mass.

## 2012-11-01 NOTE — Anesthesia Postprocedure Evaluation (Signed)
  Anesthesia Post-op Note  Patient: [name]  Procedure(s) Performed: Procedure(s) (LRB) with comments: NECK MASS BIOPSY (Right) - Excisional Bx Right Neck Mass; attempted external jugular cutdown of left side  Patient Location: PACU and Short Stay  Anesthesia Type:MAC  Level of Consciousness: awake, alert  and oriented  Airway and Oxygen Therapy: Patient Spontanous Breathing  Post-op Pain: none  Post-op Assessment: Post-op Vital signs reviewed, Patient's Cardiovascular Status Stable, Respiratory Function Stable, Patent Airway and No signs of Nausea or vomiting  Post-op Vital Signs: Reviewed and stable  Complications: No apparent anesthesia complications,  GENeral case changed to local per surgeon request secondary to inability to obtain venous access.  External jugular cutdown per Dr. Benjamine Mola unsuccessful.  Continued case with local anesthetic.

## 2012-11-01 NOTE — Brief Op Note (Signed)
11/01/2012  9:11 AM  PATIENT:  [name]  66 y.o. male  PRE-OPERATIVE DIAGNOSIS:  right neck mass  POST-OPERATIVE DIAGNOSIS:  right neck mass  PROCEDURE:  Procedure(s) (LRB) with comments: NECK MASS BIOPSY (Right) - Excisional Bx Right Neck Mass; attempted external jugular cutdown of left side  SURGEON:  Surgeon(s) and Role:    * Sui W Cliff Damiani, MD - Primary  PHYSICIAN ASSISTANT:   ASSISTANTS: none   ANESTHESIA:   MAC  EBL:     BLOOD ADMINISTERED:none  DRAINS: none   LOCAL MEDICATIONS USED:  LIDOCAINE   SPECIMEN:  Source of Specimen:  Right neck mass  DISPOSITION OF SPECIMEN:  PATHOLOGY  COUNTS:  YES  TOURNIQUET:  * No tourniquets in log *  DICTATION: Dictation done  PLAN OF CARE: Discharge to home after PACU  PATIENT DISPOSITION:  PACU - hemodynamically stable.   Delay start of Pharmacological VTE agent (>24hrs) due to surgical blood loss or risk of bleeding: not applicable

## 2012-11-01 NOTE — Op Note (Signed)
NAME:  DETAVIOUS, [name] NO.:  [id number]  MEDICAL RECORD NO.:  [id number]  LOCATION:  [facility]                          FACILITY:  [facility]  PHYSICIAN:  Leta Baptist, MD            DATE OF BIRTH:  September 29, 1946  DATE OF PROCEDURE:  11/01/2012 DATE OF DISCHARGE:                              OPERATIVE REPORT   SURGEON:  Leta Baptist, MD  PREOPERATIVE DIAGNOSIS:  Right neck mass.  POSTOPERATIVE DIAGNOSIS:  Right neck mass.  PROCEDURE PERFORMED:  Open biopsy of the right neck mass.  ANESTHESIA:  MAC anesthesia.  COMPLICATIONS:  None.  ESTIMATED BLOOD LOSS:  Minimal.  INDICATION FOR PROCEDURE:  The patient is a 66 year old male who recently noted right neck mass.  According to the wife, the size of the mass has increased over the past 3 months.  A recent ultrasound showed a 2.3-cm mass on the right side of his neck.  The mass was concerning for malignancy.  Based on the above findings, the decision was made for the patient to undergo open biopsy of the neck mass.  The risks, benefits, alternatives, and details of the procedure were discussed with the patient.  Questions were invited and answered.  Informed consent was obtained.  DESCRIPTION:  The patient was taken to the operating room and placed supine on the operating table.  It should be noted that the anesthesiologist had significant difficulty obtaining IV access on the patient.  Multiple attempts to obtain peripheral IV were unsuccessful. Attempts were also made to perform a cut down of his left external jugular.  However, a significant amount of hematoma was noted around the left EJ area.  The decision was therefore proceed with a MAC anesthesia and with local lidocaine infusion.  The patient was positioned and prepped and draped in a standard fashion for left neck surgery.  A shoulder roll was placed.  A 1% lidocaine with 1:100:000 epinephrine was infiltrated around the right level 2 neck.  A 2.5 cm mass was  noted inferior to the right angle of the mandible.  The incision was carried down past the level of the platysma muscles.  A superiorly based subplatysmal flap was elevated in a standard fashion.  The sternocleidomastoid muscle was then retracted posteriorly.  The mass was then carefully exposed.  Two large specimens were obtained from the right neck mass with sharp dissection.  The specimen was sent to the pathology department for permanent histologic identification.  The surgical site was copiously irrigated.  A piece of Surgicel was placed. The incision was closed in layers with 4-0 Vicryl and Dermabond.  The care of the patient was turned over to the anesthesiologist.  The patient was transferred to the recovery room in good condition.  OPERATIVE FINDINGS:  A 2.5-cm level 2 neck mass was noted.  Two pieces of specimens were obtained from the right neck mass.  They were sent to the pathology Department for histologic identification.  SPECIMEN:  Right neck mass.  FOLLOWUP CARE:  The patient will be discharged home once he is awake and alert.  He will be placed on Percocet 1 tablet p.o. q.4 hours p.r.n.  pain, and Augmentin 875 mg p.o. b.i.d. for 5 days.  The patient will follow up in my office in 2 weeks.     Leta Baptist, MD     ST/MEDQ  D:  11/01/2012  T:  11/01/2012  Job:  [id number]

## 2012-11-01 NOTE — Transfer of Care (Signed)
Immediate Anesthesia Transfer of Care Note  Patient: [name]  Procedure(s) Performed: Procedure(s) (LRB) with comments: NECK MASS BIOPSY (Right) - Excisional Bx Right Neck Mass; attempted external jugular cutdown of left side  Patient Location: PACU and Short Stay  Anesthesia Type:MAC  Level of Consciousness: awake, alert  and oriented  Airway & Oxygen Therapy: Patient Spontanous Breathing  Post-op Assessment: Report given to PACU RN  Post vital signs: Reviewed and stable  Complications: No apparent anesthesia complications

## 2012-11-02 ENCOUNTER — Encounter (HOSPITAL_COMMUNITY): Payer: Self-pay | Admitting: Otolaryngology

## 2012-11-06 NOTE — Addendum Note (Signed)
Addendum  created 11/06/12 0846 by Vista Deck, CRNA   Modules edited:Charges VN

## 2012-11-07 ENCOUNTER — Other Ambulatory Visit (INDEPENDENT_AMBULATORY_CARE_PROVIDER_SITE_OTHER): Payer: Self-pay | Admitting: Otolaryngology

## 2012-11-07 DIAGNOSIS — R221 Localized swelling, mass and lump, neck: Secondary | ICD-10-CM

## 2012-11-12 ENCOUNTER — Ambulatory Visit (HOSPITAL_COMMUNITY)
Admission: RE | Admit: 2012-11-12 | Discharge: 2012-11-12 | Disposition: A | Discharge status: Home / Self Care | Payer: Medicare Other | Source: Ambulatory Visit | Attending: Otolaryngology | Admitting: Otolaryngology

## 2012-11-12 DIAGNOSIS — R221 Localized swelling, mass and lump, neck: Secondary | ICD-10-CM

## 2012-11-12 DIAGNOSIS — M539 Dorsopathy, unspecified: Secondary | ICD-10-CM | POA: Insufficient documentation

## 2012-11-12 DIAGNOSIS — K119 Disease of salivary gland, unspecified: Secondary | ICD-10-CM | POA: Insufficient documentation

## 2012-11-12 MED ORDER — IOHEXOL 300 MG/ML  SOLN
75.0000 mL | Freq: Once | INTRAMUSCULAR | Status: AC | PRN
  Administered 2012-11-12: 75 mL via INTRAVENOUS

## 2012-11-14 ENCOUNTER — Other Ambulatory Visit: Payer: Self-pay

## 2012-11-14 MED ORDER — OMEPRAZOLE 40 MG PO CPDR: 40.0000 mg | DELAYED_RELEASE_CAPSULE | Freq: Every day | ORAL | Status: DC

## 2012-11-14 MED ORDER — TAMSULOSIN HCL 0.4 MG PO CAPS: 0.4000 mg | ORAL_CAPSULE | Freq: Every day | ORAL | Status: DC

## 2012-11-14 MED ORDER — LISINOPRIL 20 MG PO TABS: 20.0000 mg | ORAL_TABLET | Freq: Every day | ORAL | Status: DC

## 2012-11-14 MED ORDER — HYDROCHLOROTHIAZIDE 25 MG PO TABS: 25.0000 mg | ORAL_TABLET | Freq: Every day | ORAL | Status: DC

## 2012-11-15 ENCOUNTER — Ambulatory Visit (INDEPENDENT_AMBULATORY_CARE_PROVIDER_SITE_OTHER): Payer: Medicare Other | Admitting: Otolaryngology

## 2012-11-15 DIAGNOSIS — C07 Malignant neoplasm of parotid gland: Secondary | ICD-10-CM

## 2012-11-23 ENCOUNTER — Other Ambulatory Visit: Payer: Self-pay | Admitting: Otolaryngology

## 2012-11-23 ENCOUNTER — Encounter: Payer: Self-pay | Admitting: Family Medicine

## 2012-11-23 ENCOUNTER — Encounter (HOSPITAL_COMMUNITY): Payer: Self-pay

## 2012-11-23 ENCOUNTER — Ambulatory Visit (INDEPENDENT_AMBULATORY_CARE_PROVIDER_SITE_OTHER): Payer: Medicare Other | Admitting: Family Medicine

## 2012-11-23 VITALS — BP 120/84 | HR 86 | Resp 15

## 2012-11-23 DIAGNOSIS — R109 Unspecified abdominal pain: Secondary | ICD-10-CM

## 2012-11-23 DIAGNOSIS — G8929 Other chronic pain: Secondary | ICD-10-CM

## 2012-11-23 DIAGNOSIS — C07 Malignant neoplasm of parotid gland: Secondary | ICD-10-CM

## 2012-11-23 DIAGNOSIS — I10 Essential (primary) hypertension: Secondary | ICD-10-CM

## 2012-11-23 DIAGNOSIS — E119 Type 2 diabetes mellitus without complications: Secondary | ICD-10-CM

## 2012-11-23 HISTORY — DX: Malignant neoplasm of parotid gland: C07

## 2012-11-23 LAB — BASIC METABOLIC PANEL
BUN: 20 mg/dL (ref 6–23)
CO2: 28 mEq/L (ref 19–32)
Chloride: 100 mEq/L (ref 96–112)
Glucose, Bld: 78 mg/dL (ref 70–99)
Potassium: 4.5 mEq/L (ref 3.5–5.3)

## 2012-11-23 LAB — CBC
HCT: 45.8 % (ref 39.0–52.0)
Hemoglobin: 15.1 g/dL (ref 13.0–17.0)
MCH: 24 pg — ABNORMAL LOW (ref 26.0–34.0)
MCHC: 33 g/dL (ref 30.0–36.0)
MCV: 72.7 fL — ABNORMAL LOW (ref 78.0–100.0)

## 2012-11-23 LAB — ESTIMATED GFR: GFR, Est Non African American: 74 mL/min

## 2012-11-23 MED ORDER — OXYCODONE-ACETAMINOPHEN 5-325 MG PO TABS: ORAL_TABLET | ORAL | Status: DC

## 2012-11-23 NOTE — Progress Notes (Signed)
  Subjective:    Patient ID: [name], male    DOB: 1946-06-21, 66 y.o.   MRN: [id number]  HPI  Patient here to follow chronic medical problems. 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 He feels increased pain in his neck and feels like the area is enlarging. Diabetes mellitus he was seen by his endocrinologist last month and his diabetes has worsened to 8.6% his Lantus was increased to 60 units his wife is only been getting sliding scale at mealtime she did not know that she's was beginning 14 units plus sliding scale of humalog His pain level is typically a 7 with the medication. Unfortunately we had to decrease and hydrocodone because he lost the prescription for the Percocet however we will resume Percocet at this visit.   Review of Systems  GEN- denies fatigue, fever, weight loss,weakness, recent illness HEENT- denies eye drainage, change in vision, nasal discharge, CVS- denies chest pain, palpitations RESP- denies SOB, cough, wheeze ABD- denies N/V, change in stools, + chronic abd pain GU- denies dysuria, hematuria, dribbling, incontinence MSK- +joint pain, muscle aches, injury Neuro- denies headache, dizziness, syncope, seizure activity      Objective:   Physical Exam GEN- NAD, alert and oriented x3, sitting in wheelchair, obese HEENT- EOMI, non injected sclera, pink conjunctiva, MMM, oropharynx clear, Neck- TTP anterior right neck  CVS- RRR, no murmur RESP-CTAB EXT-1+ edema R >L Pulses- Radial, DP- 2+ Neuro- right hemiparesis,  Skin-  right jawline enlarged gland - , mild TTP,  Right anterior neck near small ingrown hair, no cellulitis      Assessment & Plan:

## 2012-11-23 NOTE — Assessment & Plan Note (Signed)
I will plan to switch him to long acting narcotics after his surgery, for now script for percocet given

## 2012-11-23 NOTE — Progress Notes (Signed)
Did get in touch with pt wife cell-had lm at home. Pt is at Dr Dorian Heckle office this am. Discussed with her if he should have this surgery main hospital or [facility]-Discussed with Dr Chriss Driver also-due to uncontrolled diabetes, very poor iv access,CVA with rt hemiplegia, and possible sleep apnea--should be done main or. Dr Chriss Driver agreed. Dr Buelah Manis agreed. Dr Benjamine Mola office notified.

## 2012-11-23 NOTE — Progress Notes (Signed)
Patient has history of asthma per wife. Unable to log info into history section of epic. Would not accept

## 2012-11-23 NOTE — Assessment & Plan Note (Signed)
Well-controlled on current meds 

## 2012-11-23 NOTE — Assessment & Plan Note (Signed)
Diabetes mellitus uncontrolled. I called his endocrinologist. She be on 14 units of Humalog with meals Lantus 60 units and sliding scale insulin with meals. I instructed his wife on this at the end of the visit

## 2012-11-23 NOTE — Assessment & Plan Note (Signed)
I spoke with Elizebeth Koller RN at [facility] Day surgery [name] is to undergo parotidectomy secondary to adenocarcinoma seen. Because of his comorbidities he is at high risk for complication and needs close monitoring after the procedure. Concerns will be his blood sugars especially in setting of uncontrolled diabetes and he will be n.p.o. for the procedure. He also will have difficulty with positioning and movement as he has hemiparesis and is confined to wheelchair. Pain control will also be an issue as he already has chronic pain and is on oxycodone. I recommend that this be done at the short stay at the hospital for because of closer monitoring. This was discussed with pt Wife

## 2012-11-23 NOTE — Patient Instructions (Addendum)
Get the labs done today for your pre-op Lantus 60 units at bedtime Give 14 units with each meal, plus the sliding scale given by Dr. Dorris Fetch F/U 4-6 weeks after surgery

## 2012-11-24 ENCOUNTER — Encounter (HOSPITAL_COMMUNITY): Payer: Self-pay | Admitting: *Deleted

## 2012-11-24 ENCOUNTER — Observation Stay (HOSPITAL_COMMUNITY)
Admit: 2012-11-24 | Discharge: 2012-11-25 | Disposition: A | Discharge status: Home / Self Care | Payer: Medicare Other | Source: Ambulatory Visit | Attending: Otolaryngology | Admitting: Otolaryngology

## 2012-11-24 ENCOUNTER — Ambulatory Visit (HOSPITAL_COMMUNITY): Payer: Medicare Other | Admitting: *Deleted

## 2012-11-24 ENCOUNTER — Encounter (HOSPITAL_COMMUNITY)
Disposition: A | Discharge status: Home / Self Care | Payer: Self-pay | Source: Ambulatory Visit | Attending: Otolaryngology

## 2012-11-24 DIAGNOSIS — Z23 Encounter for immunization: Secondary | ICD-10-CM | POA: Insufficient documentation

## 2012-11-24 DIAGNOSIS — I10 Essential (primary) hypertension: Secondary | ICD-10-CM | POA: Insufficient documentation

## 2012-11-24 DIAGNOSIS — C07 Malignant neoplasm of parotid gland: Principal | ICD-10-CM | POA: Insufficient documentation

## 2012-11-24 DIAGNOSIS — E119 Type 2 diabetes mellitus without complications: Secondary | ICD-10-CM | POA: Insufficient documentation

## 2012-11-24 DIAGNOSIS — Z794 Long term (current) use of insulin: Secondary | ICD-10-CM | POA: Insufficient documentation

## 2012-11-24 HISTORY — DX: Unspecified asthma, uncomplicated: J45.909

## 2012-11-24 HISTORY — PX: PAROTIDECTOMY: SHX2163

## 2012-11-24 LAB — GLUCOSE, CAPILLARY
Glucose-Capillary: 141 mg/dL — ABNORMAL HIGH (ref 70–99)
Glucose-Capillary: 232 mg/dL — ABNORMAL HIGH (ref 70–99)

## 2012-11-24 SURGERY — EXCISION, PAROTID GLAND
Anesthesia: General | Site: Neck | Wound class: Clean

## 2012-11-24 MED ORDER — MIDAZOLAM HCL 5 MG/5ML IJ SOLN
INTRAMUSCULAR | Status: DC | PRN
  Administered 2012-11-24: .5 mg via INTRAVENOUS

## 2012-11-24 MED ORDER — PROPOFOL 10 MG/ML IV BOLUS
INTRAVENOUS | Status: DC | PRN
  Administered 2012-11-24: 200 mg via INTRAVENOUS

## 2012-11-24 MED ORDER — INSULIN ASPART 100 UNIT/ML ~~LOC~~ SOLN
0.0000 [IU] | Freq: Every day | SUBCUTANEOUS | Status: DC
  Administered 2012-11-24: 2 [IU] via SUBCUTANEOUS

## 2012-11-24 MED ORDER — ZOLPIDEM TARTRATE 5 MG PO TABS: 5.0000 mg | ORAL_TABLET | Freq: Every evening | ORAL | Status: DC | PRN

## 2012-11-24 MED ORDER — ALLOPURINOL 300 MG PO TABS
300.0000 mg | ORAL_TABLET | Freq: Every day | ORAL | Status: DC
  Administered 2012-11-25: 300 mg via ORAL
  Filled 2012-11-24: qty 1

## 2012-11-24 MED ORDER — CEFAZOLIN SODIUM-DEXTROSE 2-3 GM-% IV SOLR
2.0000 g | Freq: Once | INTRAVENOUS | Status: AC
  Administered 2012-11-24: 2 g via INTRAVENOUS
  Filled 2012-11-24: qty 50

## 2012-11-24 MED ORDER — FENTANYL CITRATE 0.05 MG/ML IJ SOLN
INTRAMUSCULAR | Status: AC
  Filled 2012-11-24: qty 2

## 2012-11-24 MED ORDER — FENTANYL CITRATE 0.05 MG/ML IJ SOLN
INTRAMUSCULAR | Status: DC | PRN
  Administered 2012-11-24 (×2): 100 ug via INTRAVENOUS
  Administered 2012-11-24 (×3): 50 ug via INTRAVENOUS

## 2012-11-24 MED ORDER — PROMETHAZINE HCL 25 MG RE SUPP: 25.0000 mg | Freq: Four times a day (QID) | RECTAL | Status: DC | PRN

## 2012-11-24 MED ORDER — LINAGLIPTIN 5 MG PO TABS
5.0000 mg | ORAL_TABLET | Freq: Every day | ORAL | Status: DC
  Administered 2012-11-24 – 2012-11-25 (×2): 5 mg via ORAL
  Filled 2012-11-24 (×2): qty 1

## 2012-11-24 MED ORDER — OXYCODONE-ACETAMINOPHEN 5-325 MG PO TABS
1.0000 | ORAL_TABLET | Freq: Four times a day (QID) | ORAL | Status: DC | PRN
  Administered 2012-11-24: 2 via ORAL
  Filled 2012-11-24: qty 2

## 2012-11-24 MED ORDER — LACTATED RINGERS IV SOLN
INTRAVENOUS | Status: DC | PRN
  Administered 2012-11-24 (×2): via INTRAVENOUS

## 2012-11-24 MED ORDER — TAMSULOSIN HCL 0.4 MG PO CAPS
0.4000 mg | ORAL_CAPSULE | Freq: Every day | ORAL | Status: DC
Start: 2012-11-24 — End: 2012-11-25
  Administered 2012-11-24: 0.4 mg via ORAL
  Filled 2012-11-24 (×3): qty 1

## 2012-11-24 MED ORDER — LIDOCAINE-EPINEPHRINE (PF) 1 %-1:200000 IJ SOLN
INTRAMUSCULAR | Status: AC
  Filled 2012-11-24: qty 10

## 2012-11-24 MED ORDER — CEFAZOLIN SODIUM-DEXTROSE 2-3 GM-% IV SOLR
INTRAVENOUS | Status: AC
  Filled 2012-11-24: qty 50

## 2012-11-24 MED ORDER — LISINOPRIL 20 MG PO TABS
20.0000 mg | ORAL_TABLET | Freq: Every day | ORAL | Status: DC
  Administered 2012-11-24 – 2012-11-25 (×2): 20 mg via ORAL
  Filled 2012-11-24 (×2): qty 1

## 2012-11-24 MED ORDER — INSULIN ASPART 100 UNIT/ML ~~LOC~~ SOLN
0.0000 [IU] | Freq: Three times a day (TID) | SUBCUTANEOUS | Status: DC
  Administered 2012-11-24 – 2012-11-25 (×2): 11 [IU] via SUBCUTANEOUS

## 2012-11-24 MED ORDER — SIMVASTATIN 20 MG PO TABS
20.0000 mg | ORAL_TABLET | Freq: Every day | ORAL | Status: DC
  Administered 2012-11-24: 20 mg via ORAL
  Filled 2012-11-24 (×2): qty 1

## 2012-11-24 MED ORDER — ROCURONIUM BROMIDE 100 MG/10ML IV SOLN
INTRAVENOUS | Status: DC | PRN
  Administered 2012-11-24: 40 mg via INTRAVENOUS

## 2012-11-24 MED ORDER — LIDOCAINE-EPINEPHRINE (PF) 1 %-1:200000 IJ SOLN
INTRAMUSCULAR | Status: DC | PRN
  Administered 2012-11-24: 4 mL via INTRADERMAL

## 2012-11-24 MED ORDER — HYDROCHLOROTHIAZIDE 25 MG PO TABS
25.0000 mg | ORAL_TABLET | Freq: Every day | ORAL | Status: DC
  Administered 2012-11-24 – 2012-11-25 (×2): 25 mg via ORAL
  Filled 2012-11-24 (×2): qty 1

## 2012-11-24 MED ORDER — MORPHINE SULFATE 2 MG/ML IJ SOLN
2.0000 mg | INTRAMUSCULAR | Status: DC | PRN
Start: 2012-11-24 — End: 2012-11-25
  Administered 2012-11-24: 2 mg via INTRAVENOUS
  Administered 2012-11-24: 4 mg via INTRAVENOUS
  Filled 2012-11-24: qty 2

## 2012-11-24 MED ORDER — PNEUMOCOCCAL VAC POLYVALENT 25 MCG/0.5ML IJ INJ
0.5000 mL | INJECTION | INTRAMUSCULAR | Status: AC
  Administered 2012-11-25: 0.5 mL via INTRAMUSCULAR
  Filled 2012-11-24: qty 0.5

## 2012-11-24 MED ORDER — INSULIN GLARGINE 100 UNIT/ML ~~LOC~~ SOLN
60.0000 [IU] | Freq: Every day | SUBCUTANEOUS | Status: DC
  Administered 2012-11-24: 60 [IU] via SUBCUTANEOUS

## 2012-11-24 MED ORDER — ONDANSETRON HCL 4 MG/2ML IJ SOLN
INTRAMUSCULAR | Status: DC | PRN
  Administered 2012-11-24: 4 mg via INTRAVENOUS

## 2012-11-24 MED ORDER — METOPROLOL SUCCINATE ER 50 MG PO TB24
50.0000 mg | ORAL_TABLET | Freq: Every day | ORAL | Status: DC
  Administered 2012-11-25: 50 mg via ORAL
  Filled 2012-11-24: qty 1

## 2012-11-24 MED ORDER — CLONIDINE HCL 0.2 MG PO TABS
0.2000 mg | ORAL_TABLET | Freq: Three times a day (TID) | ORAL | Status: DC
  Administered 2012-11-24 – 2012-11-25 (×3): 0.2 mg via ORAL
  Filled 2012-11-24 (×5): qty 1

## 2012-11-24 MED ORDER — PROMETHAZINE HCL 25 MG PO TABS: 25.0000 mg | ORAL_TABLET | Freq: Four times a day (QID) | ORAL | Status: DC | PRN

## 2012-11-24 MED ORDER — FEBUXOSTAT 40 MG PO TABS
40.0000 mg | ORAL_TABLET | Freq: Every day | ORAL | Status: DC
  Administered 2012-11-24: 40 mg via ORAL
  Filled 2012-11-24 (×2): qty 1

## 2012-11-24 MED ORDER — DEXAMETHASONE SODIUM PHOSPHATE 4 MG/ML IJ SOLN
INTRAMUSCULAR | Status: DC | PRN
  Administered 2012-11-24: 4 mg via INTRAVENOUS

## 2012-11-24 MED ORDER — KCL IN DEXTROSE-NACL 20-5-0.45 MEQ/L-%-% IV SOLN
INTRAVENOUS | Status: DC
  Administered 2012-11-24 – 2012-11-25 (×2): via INTRAVENOUS
  Filled 2012-11-24 (×3): qty 1000

## 2012-11-24 MED ORDER — METOCLOPRAMIDE HCL 5 MG/ML IJ SOLN: 10.0000 mg | Freq: Once | INTRAMUSCULAR | Status: DC | PRN

## 2012-11-24 MED ORDER — GLYCOPYRROLATE 0.2 MG/ML IJ SOLN
INTRAMUSCULAR | Status: DC | PRN
  Administered 2012-11-24: .4 mg via INTRAVENOUS

## 2012-11-24 MED ORDER — MORPHINE SULFATE 2 MG/ML IJ SOLN
INTRAMUSCULAR | Status: AC
  Filled 2012-11-24: qty 1

## 2012-11-24 MED ORDER — BACITRACIN ZINC 500 UNIT/GM EX OINT
TOPICAL_OINTMENT | CUTANEOUS | Status: AC
  Filled 2012-11-24: qty 15

## 2012-11-24 MED ORDER — NEOSTIGMINE METHYLSULFATE 1 MG/ML IJ SOLN
INTRAMUSCULAR | Status: DC | PRN
  Administered 2012-11-24: 3 mg via INTRAVENOUS

## 2012-11-24 MED ORDER — PANTOPRAZOLE SODIUM 40 MG PO TBEC
40.0000 mg | DELAYED_RELEASE_TABLET | Freq: Every day | ORAL | Status: DC
  Administered 2012-11-24: 40 mg via ORAL
  Filled 2012-11-24: qty 1

## 2012-11-24 MED ORDER — FENTANYL CITRATE 0.05 MG/ML IJ SOLN
25.0000 ug | INTRAMUSCULAR | Status: DC | PRN
  Administered 2012-11-24 (×2): 50 ug via INTRAVENOUS

## 2012-11-24 MED ORDER — GABAPENTIN 300 MG PO CAPS
300.0000 mg | ORAL_CAPSULE | Freq: Two times a day (BID) | ORAL | Status: DC
  Administered 2012-11-24 – 2012-11-25 (×2): 300 mg via ORAL
  Filled 2012-11-24 (×4): qty 1

## 2012-11-24 MED ORDER — LIDOCAINE HCL (CARDIAC) 20 MG/ML IV SOLN
INTRAVENOUS | Status: DC | PRN
  Administered 2012-11-24: 100 mg via INTRAVENOUS

## 2012-11-24 MED ORDER — AMLODIPINE BESYLATE 10 MG PO TABS
10.0000 mg | ORAL_TABLET | Freq: Every day | ORAL | Status: DC
  Administered 2012-11-25: 10 mg via ORAL
  Filled 2012-11-24: qty 1

## 2012-11-24 MED ORDER — GABAPENTIN 300 MG PO CAPS
600.0000 mg | ORAL_CAPSULE | Freq: Every day | ORAL | Status: DC
  Administered 2012-11-24: 600 mg via ORAL
  Filled 2012-11-24 (×2): qty 2

## 2012-11-24 MED ORDER — CEFAZOLIN SODIUM 1-5 GM-% IV SOLN
1.0000 g | Freq: Four times a day (QID) | INTRAVENOUS | Status: DC
  Administered 2012-11-24 – 2012-11-25 (×3): 1 g via INTRAVENOUS
  Filled 2012-11-24 (×4): qty 50

## 2012-11-24 MED ORDER — LIDOCAINE HCL 4 % MT SOLN
OROMUCOSAL | Status: DC | PRN
  Administered 2012-11-24: 4 mL via TOPICAL

## 2012-11-24 MED ORDER — PHENYLEPHRINE HCL 10 MG/ML IJ SOLN
10.0000 mg | INTRAVENOUS | Status: DC | PRN
  Administered 2012-11-24: 20 ug/min via INTRAVENOUS

## 2012-11-24 SURGICAL SUPPLY — 53 items
ADH SKN CLS APL DERMABOND .7 (GAUZE/BANDAGES/DRESSINGS) ×1
ATTRACTOMAT 16X20 MAGNETIC DRP (DRAPES) IMPLANT
BLADE SURG 15 STRL LF DISP TIS (BLADE) ×1 IMPLANT
BLADE SURG 15 STRL SS (BLADE) ×2
CANISTER SUCTION 2500CC (MISCELLANEOUS) ×2 IMPLANT
CLEANER TIP ELECTROSURG 2X2 (MISCELLANEOUS) ×2 IMPLANT
CLOTH BEACON ORANGE TIMEOUT ST (SAFETY) ×2 IMPLANT
CONT SPEC 4OZ CLIKSEAL STRL BL (MISCELLANEOUS) ×2 IMPLANT
CORDS BIPOLAR (ELECTRODE) ×2 IMPLANT
COVER SURGICAL LIGHT HANDLE (MISCELLANEOUS) ×2 IMPLANT
DERMABOND ADVANCED (GAUZE/BANDAGES/DRESSINGS) ×1
DERMABOND ADVANCED .7 DNX12 (GAUZE/BANDAGES/DRESSINGS) IMPLANT
DRAIN CHANNEL 10F 3/8 F FF (DRAIN) ×1 IMPLANT
DRAIN JACKSON RD 7FR 3/32 (WOUND CARE) IMPLANT
DRAPE INCISE 13X13 STRL (DRAPES) ×2 IMPLANT
ELECT COATED BLADE 2.86 ST (ELECTRODE) ×2 IMPLANT
ELECT PAIRED SUBDERMAL (MISCELLANEOUS) ×2
ELECT REM PT RETURN 9FT ADLT (ELECTROSURGICAL) ×2
ELECTRODE PAIRED SUBDERMAL (MISCELLANEOUS) IMPLANT
ELECTRODE REM PT RTRN 9FT ADLT (ELECTROSURGICAL) ×1 IMPLANT
EVACUATOR SILICONE 100CC (DRAIN) ×1 IMPLANT
GAUZE SPONGE 4X4 16PLY XRAY LF (GAUZE/BANDAGES/DRESSINGS) ×2 IMPLANT
GLOVE BIOGEL PI IND STRL 8 (GLOVE) IMPLANT
GLOVE BIOGEL PI INDICATOR 8 (GLOVE)
GLOVE ECLIPSE 7.5 STRL STRAW (GLOVE) ×2 IMPLANT
GOWN STRL NON-REIN LRG LVL3 (GOWN DISPOSABLE) ×6 IMPLANT
KIT BASIN OR (CUSTOM PROCEDURE TRAY) ×2 IMPLANT
KIT ROOM TURNOVER OR (KITS) ×2 IMPLANT
NS IRRIG 1000ML POUR BTL (IV SOLUTION) ×2 IMPLANT
PAD ARMBOARD 7.5X6 YLW CONV (MISCELLANEOUS) ×4 IMPLANT
PENCIL BUTTON HOLSTER BLD 10FT (ELECTRODE) ×2 IMPLANT
PROBE NERVBE PRASS .33 (MISCELLANEOUS) ×1 IMPLANT
SHEARS HARMONIC 9CM CVD (BLADE) IMPLANT
SPONGE INTESTINAL PEANUT (DISPOSABLE) IMPLANT
STAPLER VISISTAT 35W (STAPLE) ×2 IMPLANT
SUT BONE WAX W31G (SUTURE) ×1 IMPLANT
SUT CHROMIC 4 0 PS 2 18 (SUTURE) IMPLANT
SUT ETHILON 3 0 PS 1 (SUTURE) IMPLANT
SUT ETHILON 5 0 P 3 18 (SUTURE)
SUT NYLON ETHILON 5-0 P-3 1X18 (SUTURE) IMPLANT
SUT PROLENE 5 0 P 3 (SUTURE) ×1 IMPLANT
SUT PROLENE 5 0 PS 2 (SUTURE) ×1 IMPLANT
SUT SILK 2 0 FS (SUTURE) ×3 IMPLANT
SUT SILK 3 0 (SUTURE) ×2
SUT SILK 3-0 18XBRD TIE 12 (SUTURE) ×1 IMPLANT
SUT SILK 4 0 TIES 17X18 (SUTURE) ×2 IMPLANT
SUT VICRYL 4-0 PS2 18IN ABS (SUTURE) IMPLANT
SYR 5ML LL (SYRINGE) IMPLANT
TOWEL OR 17X24 6PK STRL BLUE (TOWEL DISPOSABLE) ×2 IMPLANT
TOWEL OR 17X26 10 PK STRL BLUE (TOWEL DISPOSABLE) ×2 IMPLANT
TRAY ENT MC OR (CUSTOM PROCEDURE TRAY) ×2 IMPLANT
TRAY FOLEY CATH 14FRSI W/METER (CATHETERS) IMPLANT
WATER STERILE IRR 1000ML POUR (IV SOLUTION) ×2 IMPLANT

## 2012-11-24 NOTE — Transfer of Care (Signed)
Immediate Anesthesia Transfer of Care Note  Patient: [name]  Procedure(s) Performed: Procedure(s) (LRB) with comments: PAROTIDECTOMY (N/A) - Total parotidectomy  Patient Location: PACU  Anesthesia Type:General  Level of Consciousness: awake, alert  and patient cooperative  Airway & Oxygen Therapy: Patient Spontanous Breathing and Patient connected to face mask oxygen  Post-op Assessment: Report given to PACU RN, Post -op Vital signs reviewed and stable and Patient moving all extremities  Post vital signs: Reviewed and stable  Complications: No apparent anesthesia complications

## 2012-11-24 NOTE — Op Note (Signed)
[name], [name] NO.:  [id number]  MEDICAL RECORD NO.:  [id number]  LOCATION:  [facility]                         FACILITY:  [facility]  PHYSICIAN:  Leta Baptist, MD            DATE OF BIRTH:  [birth year]  DATE OF PROCEDURE:  11/24/2012 DATE OF DISCHARGE:                              OPERATIVE REPORT   SURGEON:  Leta Baptist, MD  PREOPERATIVE DIAGNOSIS:  Right parotid adenocarcinoma.  POSTOPERATIVE DIAGNOSIS:  Right parotid adenocarcinoma.  PROCEDURE PERFORMED:  Right total parotidectomy with facial nerve dissection.  ANESTHESIA:  General endotracheal tube anesthesia.  COMPLICATIONS:  None.  ESTIMATED BLOOD LOSS:  100 mL.  INDICATION FOR PROCEDURE:  The patient is a 66 year old male with a history of right neck mass.  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  Subsequent CT shows a poorly defined 2.9 x 3.1 cm tail of the right parotid gland tumor.  No significant adenopathy was noted on the next CT scan.  Based on the above findings, the decision was made for the patient to undergo the right parotidectomy surgery to remove the cancerous mass.  The risks, benefits, alternatives, and details of the procedure were discussed with the patient and his wife.  Questions were invited and answered.  Informed consent was obtained.  DESCRIPTION:  The patient was taken to the operating room and placed supine on the operating table.  General endotracheal tube anesthesia was administered by the anesthesiologist.  The patient was positioned and prepped and draped in the standard fashion for right parotid surgery.  A 1% lidocaine with 1:100,000 epinephrine was injected at the planned site of incision.  Facial nerve monitoring electrodes were placed.  The facial nerve monitoring system was functional throughout the case. Standard facelift incisions was made along the right preauricular crease, extending down to the right lateral neck.  A standard SMAS  flap was elevated in a standard fashion.  Dissection was then carried out along the preauricular cartilage plane, until the main trunk of the facial nerve was identified.  Dissection was then carried out along the trunk of the facial nerve.  The superior division of the facial nerve was identified and preserved.  Attention was then focused on the inferior branch of the facial nerve.  The buccal branch was subsequently dissected free and preserved.  At this point, the parotid tumor was noted to be deep to the marginal and cervical branch of the facial nerve.  Photo documentation of the findings were obtained.  Despite efforts to dissect free the marginal end, cervical branch of the facial nerve from the tumor, the nerve was noted to course straight into the central portion of the tumor.  Dissection along the course of the tumor resulted in opening of the tumor.  As a result, the decision was made to sacrifice the marginal and cervical branch of the facial nerve.  The entire tumor, including the superficial and the deep portion of the parotid gland were then dissected free and sent to the Pathology Department for permanent histologic identification.  The surgical site was copiously irrigated.  A #10 JP drain was placed.  The incision  was then closed in layers with 4-0 Vicryl, 5-0 Prolene, and Dermabond.  The care of the patient was turned over to the anesthesiologist.  The patient was awakened from anesthesia without difficulty.  He was extubated and transferred to the recovery room in good condition.  OPERATIVE FINDINGS:  A 3.5 right tail of the parotid tumor was noted. More than 90% of the tumor was deep to the marginal and cervical branch of the facial nerve.  The entire tumor was removed and sent to the Pathology Department for permanent histologic identification.  SPECIMEN:  Right parotid tumor.  FOLLOWUP CARE:  The patient will be observed overnight in the hospital. He will most  likely be discharged home on postop day #1.     Leta Baptist, MD     ST/MEDQ  D:  11/24/2012  T:  11/24/2012  Job:  [id number]  cc:   Vic Blackbird, MD

## 2012-11-24 NOTE — Anesthesia Preprocedure Evaluation (Addendum)
Anesthesia Evaluation  Patient identified by MRN, date of birth, ID band Patient awake    Reviewed: Allergy & Precautions, H&P , NPO status , Patient's Chart, lab work & pertinent test results, reviewed documented beta blocker date and time   Airway Mallampati: III TM Distance: >3 FB Neck ROM: full    Dental   Pulmonary asthma ,  breath sounds clear to auscultation        Cardiovascular hypertension, On Medications and On Home Beta Blockers + Peripheral Vascular Disease Rhythm:regular     Neuro/Psych  Neuromuscular disease CVA, Residual Symptoms negative psych ROS   GI/Hepatic hiatal hernia, GERD-  Medicated and Controlled,(+) Hepatitis -  Endo/Other  diabetes, Insulin Dependent  Renal/GU Renal disease  negative genitourinary   Musculoskeletal   Abdominal   Peds  Hematology negative hematology ROS (+)   Anesthesia Other Findings See surgeon's H&P   Reproductive/Obstetrics negative OB ROS                          Anesthesia Physical Anesthesia Plan  ASA: III  Anesthesia Plan: General   Post-op Pain Management:    Induction: Intravenous  Airway Management Planned: Oral ETT and Video Laryngoscope Planned  Additional Equipment:   Intra-op Plan:   Post-operative Plan: Extubation in OR  Informed Consent: I have reviewed the patients History and Physical, chart, labs and discussed the procedure including the risks, benefits and alternatives for the proposed anesthesia with the patient or authorized representative who has indicated his/her understanding and acceptance.   Dental Advisory Given  Plan Discussed with: CRNA and Surgeon  Anesthesia Plan Comments:        Anesthesia Quick Evaluation

## 2012-11-24 NOTE — Anesthesia Postprocedure Evaluation (Signed)
Anesthesia Post Note  Patient: [name]  Procedure(s) Performed: Procedure(s) (LRB): PAROTIDECTOMY (N/A)  Anesthesia type: general  Patient location: PACU  Post pain: Pain level controlled  Post assessment: Patient's Cardiovascular Status Stable  Last Vitals:  Filed Vitals:   11/24/12 1215  BP: 142/85  Pulse: 77  Temp: 36.7 C  Resp: 10    Post vital signs: Reviewed and stable  Level of consciousness: sedated  Complications: No apparent anesthesia complications

## 2012-11-24 NOTE — Progress Notes (Signed)
Pt arrived to 6N room 20 via stretcher from the PACU.  Max assist needed to slide patient over to the bed from the stretcher.  R neck incision with minimal oozing of blood.  JP placed to wall suction.  HOB up 30 degrees as ordered.  VSS.  Cont pulse ox applied to patient as ordered.  Family arrived to room and they were all oriented to the dept, the room, and call bell.   Report received from PACU RN at bedside.  Will cont to monitor. Lowella Bandy Ward

## 2012-11-24 NOTE — Progress Notes (Signed)
Chest 2 view not obtained patient with hemiparesis in right arm. Patient had abd. Xray with 1 view chest dated 06/06/12. Based off of assessment patient would not be able to complete requirements to obtain lateral view of chest

## 2012-11-24 NOTE — H&P (Signed)
  H&P Update  Pt's original H&P dated 11/15/12 reviewed and placed in chart (to be scanned).  I personally examined the patient today.  No change in health. Proceed with right parotidectomy with facial nerve dissection.

## 2012-11-24 NOTE — Brief Op Note (Signed)
11/24/2012  11:31 AM  PATIENT:  [name]  66 y.o. male  PRE-OPERATIVE DIAGNOSIS:  right parotid adenocarcinoma  POST-OPERATIVE DIAGNOSIS:  same  PROCEDURE:  Procedure(s) (LRB) with comments: PAROTIDECTOMY (N/A) - Total parotidectomy (Right)  SURGEON:  Surgeon(s) and Role:    * Ascencion Dike, MD - Primary  PHYSICIAN ASSISTANT:   ASSISTANTS: Jene Every, RN   ANESTHESIA:   general  EBL:  Total I/O In: 1300 [I.V.:1300] Out: 100 [Blood:100]  BLOOD ADMINISTERED:none  DRAINS: (#10) Jackson-Pratt drain(s) with closed bulb suction in the right neck   LOCAL MEDICATIONS USED:  LIDOCAINE  and Amount: 4 ml  SPECIMEN:  Source of Specimen:  Right parotid mass (Superficial and deep lobe)  DISPOSITION OF SPECIMEN:  PATHOLOGY  COUNTS:  YES  TOURNIQUET:  * No tourniquets in log *  DICTATION: .Other Dictation: Dictation Number [id number]  PLAN OF CARE: Admit for overnight observation  PATIENT DISPOSITION:  PACU - hemodynamically stable.   Delay start of Pharmacological VTE agent (>24hrs) due to surgical blood loss or risk of bleeding: no

## 2012-11-25 LAB — GLUCOSE, CAPILLARY

## 2012-11-25 NOTE — Discharge Summary (Signed)
Physician Discharge Summary  Patient ID: [name] MRN: [id number] DOB/AGE: [birth year] 66 y.o.  Admit date: 11/24/2012 Discharge date: 11/25/2012  Admission Diagnoses: Right parotid adenocarcinoma  Discharge Diagnoses: Right parotid adenocarcinoma Active Problems:  * No active hospital problems. *    Discharged Condition: good  Hospital Course: Pt has a smooth recovery course.  He tolerated po well.  He does have the expected right lower face paralysis.  Consults: None  Significant Diagnostic Studies: none  Treatments: surgery: Right total parotidectomy.  Discharge Exam: Blood pressure 121/85, pulse 80, temperature 98.1 F (36.7 C), temperature source Oral, resp. rate 18, height 6' 1"  (1.854 m), weight 108.863 kg (240 lb), SpO2 99.00%. Incision/Wound: C/D/I JP drain removed. No bleeding or hematoma. Right lower face paralysis.  Disposition: 01-Home or Self Care  Discharge Orders    Future Appointments: Provider: Department: Dept Phone: Center:   12/28/2012 11:00 AM [name], MD [facility] [phone] [facility]     Future Orders Please Complete By Expires   Diet general      Increase activity slowly          Medication List     As of 11/25/2012 10:34 AM    TAKE these medications         acetaminophen 500 MG tablet   Commonly known as: TYLENOL   Take 500 mg by mouth every 6 (six) hours as needed. Pain.      allopurinol 300 MG tablet   Commonly known as: ZYLOPRIM   Take 300 mg by mouth daily.      amLODipine 10 MG tablet   Commonly known as: NORVASC   Take 1 tablet (10 mg total) by mouth daily.      amoxicillin-clavulanate 875-125 MG per tablet   Commonly known as: AUGMENTIN   Take 1 tablet by mouth 2 (two) times daily. For 5 days for boil infection      cloNIDine 0.2 MG tablet   Commonly known as: CATAPRES   Take 1 tablet (0.2 mg total) by mouth 3 (three) times daily.      clotrimazole-betamethasone cream   Commonly known as:  LOTRISONE   Apply to affected area 2 times daily      febuxostat 40 MG tablet   Commonly known as: ULORIC   Take 1 tablet (40 mg total) by mouth daily.      gabapentin 300 MG capsule   Commonly known as: NEURONTIN   Take 1-2 capsules (300-600 mg total) by mouth 3 (three) times daily. 1 tablet twice a day and 2 tablets at bedtime      hydrochlorothiazide 25 MG tablet   Commonly known as: HYDRODIURIL   Take 1 tablet (25 mg total) by mouth daily.      insulin glargine 100 UNIT/ML injection   Commonly known as: LANTUS   Inject 60 Units into the skin at bedtime.      insulin lispro 100 UNIT/ML injection   Commonly known as: HUMALOG   Inject 14-17 Units into the skin 3 (three) times daily before meals. Uses according to sliding scale at home. And 14 units QAC      linagliptin 5 MG Tabs tablet   Commonly known as: TRADJENTA   Take 1 tablet (5 mg total) by mouth daily.      lisinopril 20 MG tablet   Commonly known as: PRINIVIL,ZESTRIL   Take 1 tablet (20 mg total) by mouth daily.      metoprolol succinate 50 MG 24 hr  tablet   Commonly known as: TOPROL-XL   Take 1 tablet (50 mg total) by mouth daily. Take with or immediately following a meal.      omeprazole 40 MG capsule   Commonly known as: PRILOSEC   Take 1 capsule (40 mg total) by mouth daily.      oxyCODONE-acetaminophen 5-325 MG per tablet   Commonly known as: PERCOCET/ROXICET   1 tablet three times a day as needed for pain      simvastatin 20 MG tablet   Commonly known as: ZOCOR   Take 1 tablet (20 mg total) by mouth at bedtime.      Tamsulosin HCl 0.4 MG Caps   Commonly known as: FLOMAX   Take 1 capsule (0.4 mg total) by mouth daily after breakfast.           Follow-up Information    Follow up with DR Dallana Mavity Jonathon Bellows [place]. On 11/29/2012. (1pm)    Contact information:   [address] [place] [address]          Signed: Ascencion Dike 11/25/2012, 10:34 AM

## 2012-11-26 ENCOUNTER — Encounter (HOSPITAL_COMMUNITY): Payer: Self-pay | Admitting: Otolaryngology

## 2012-11-26 ENCOUNTER — Encounter (HOSPITAL_BASED_OUTPATIENT_CLINIC_OR_DEPARTMENT_OTHER): Admission: RE | Payer: Self-pay | Source: Ambulatory Visit

## 2012-11-26 ENCOUNTER — Ambulatory Visit (HOSPITAL_BASED_OUTPATIENT_CLINIC_OR_DEPARTMENT_OTHER): Admission: RE | Admit: 2012-11-26 | Payer: Medicare Other | Source: Ambulatory Visit | Admitting: Otolaryngology

## 2012-11-26 SURGERY — EXCISION, PAROTID GLAND
Anesthesia: General | Laterality: Right

## 2012-11-29 ENCOUNTER — Ambulatory Visit (INDEPENDENT_AMBULATORY_CARE_PROVIDER_SITE_OTHER): Payer: Medicare Other | Admitting: Otolaryngology

## 2012-11-29 ENCOUNTER — Other Ambulatory Visit: Payer: Self-pay | Admitting: Family Medicine

## 2012-12-14 ENCOUNTER — Other Ambulatory Visit: Payer: Self-pay

## 2012-12-14 ENCOUNTER — Telehealth: Payer: Self-pay | Admitting: Family Medicine

## 2012-12-14 IMAGING — CT CT ABD-PELV W/O CM
2 of 4 series · 16 of 46 positions shown, 18 images · non-contrast
Comparison: CT abdomen pelvis - 10/22/2011; 05/08/2011;
fluoroscopic guided L2 vertebral body biopsy - 07/13/2011

CLINICAL DATA: Fecal impaction

CT ABDOMEN AND PELVIS WITHOUT CONTRAST
TECHNIQUE: Multidetector CT imaging of the abdomen and pelvis was
performed following the standard protocol without intravenous
contrast.

[Series 2: abdomen/pelvis w/o contrast · axial · non-contrast · 0.98mm/px · z∈[-559,-109]mm · 13 of 104 slices shown, 15 images]
[im 7/104  soft-tissue]
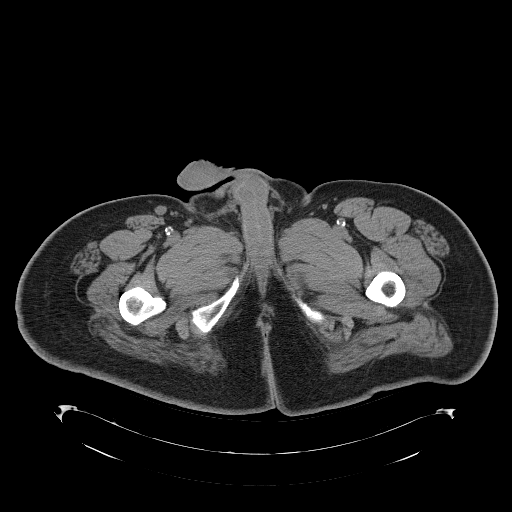
[im 7/104  bone]
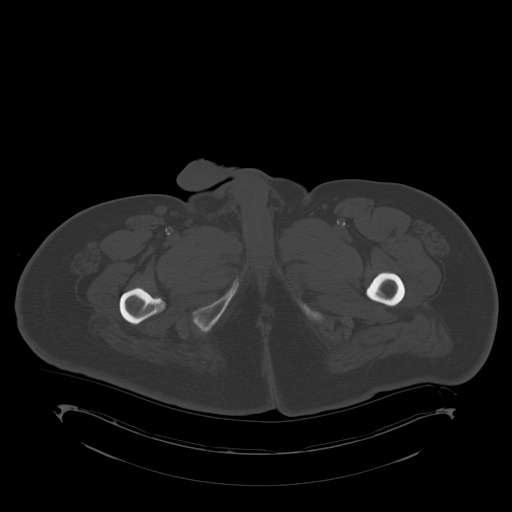
[im 14/104  soft-tissue]
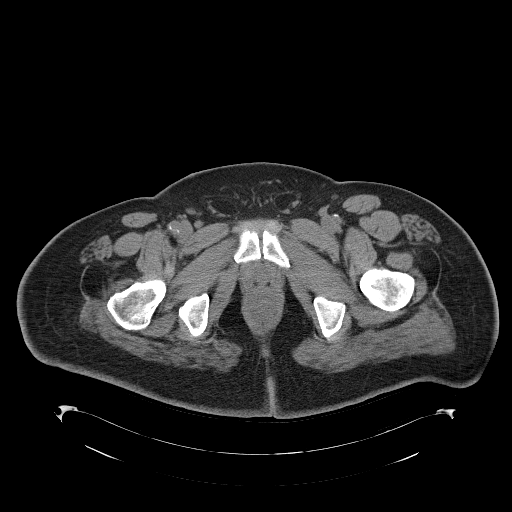
[im 21/104  soft-tissue]
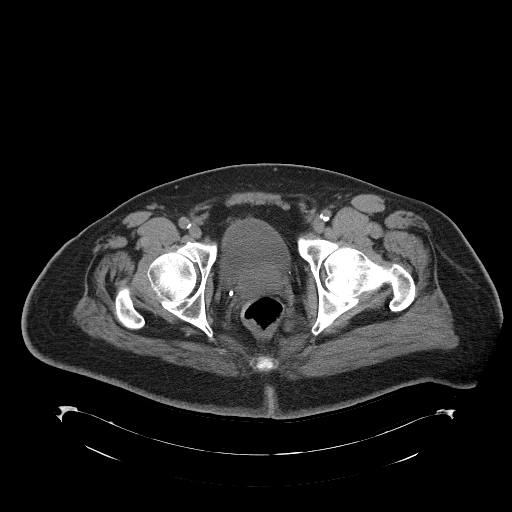
[im 28/104  soft-tissue]
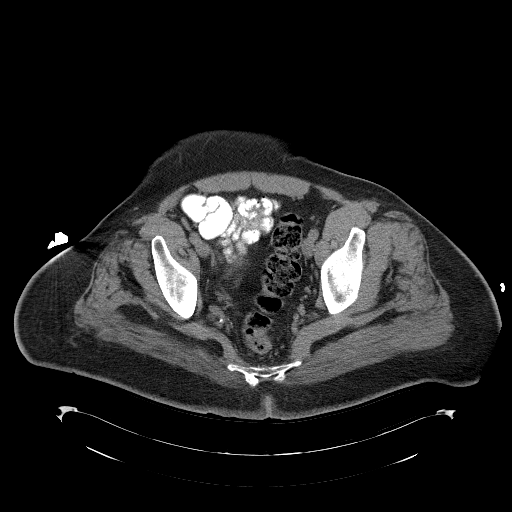
[im 35/104  soft-tissue]
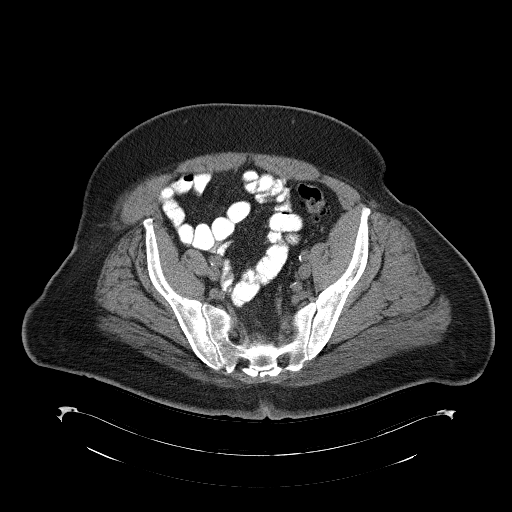
[im 42/104  soft-tissue]
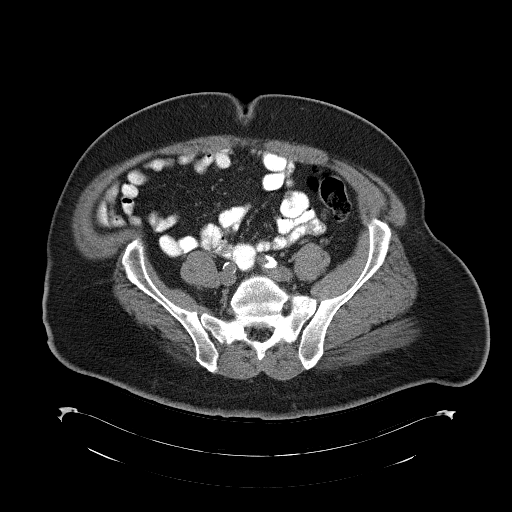
[im 55/104  soft-tissue]
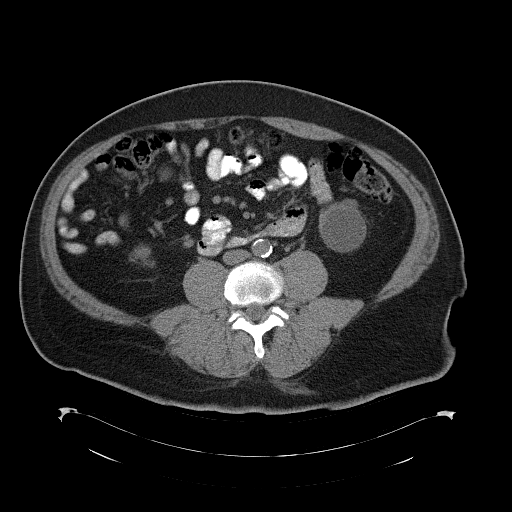
[im 62/104  soft-tissue]
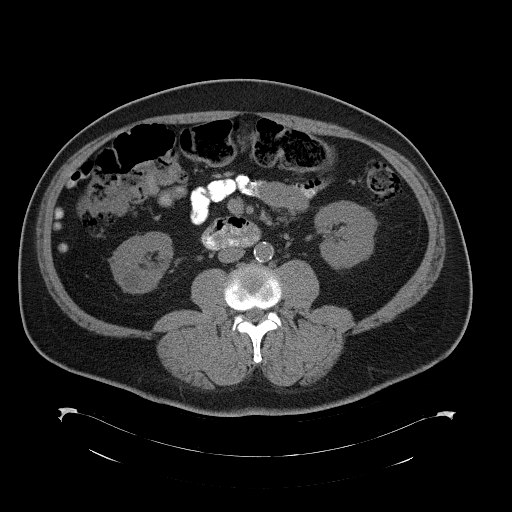
[im 69/104  soft-tissue]
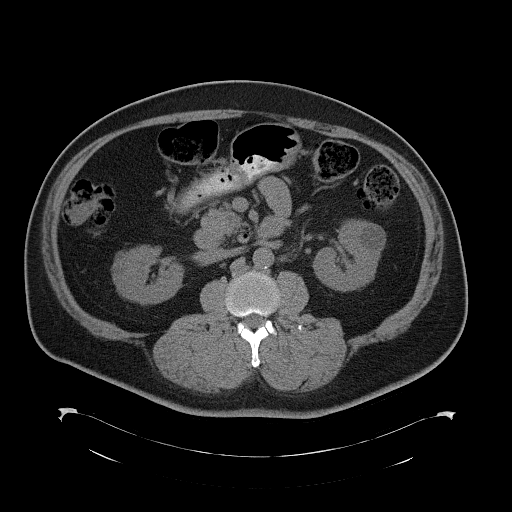
[im 69/104  bone]
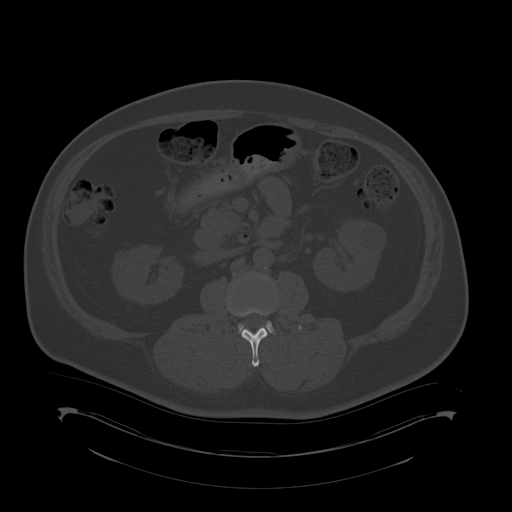
[im 76/104  soft-tissue]
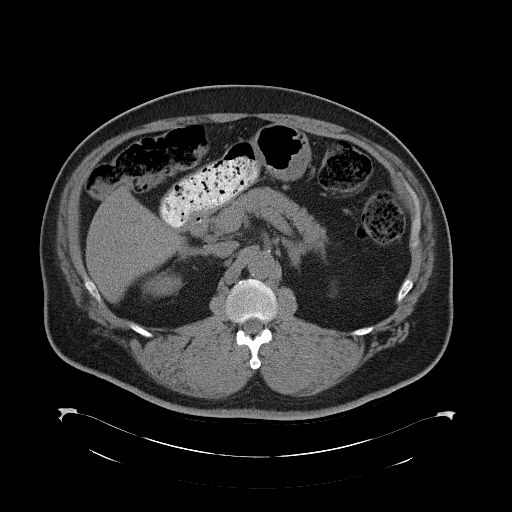
[im 83/104  soft-tissue]
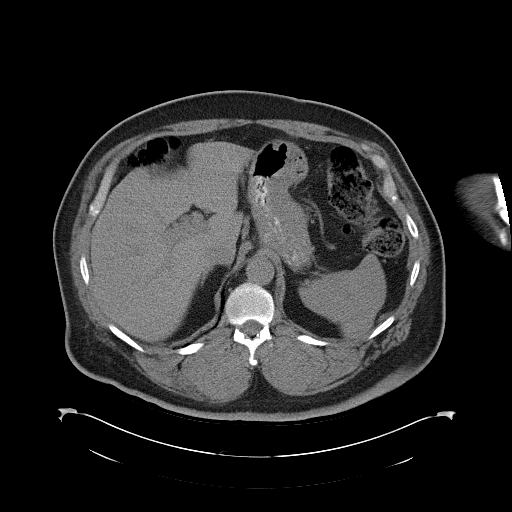
[im 90/104  soft-tissue]
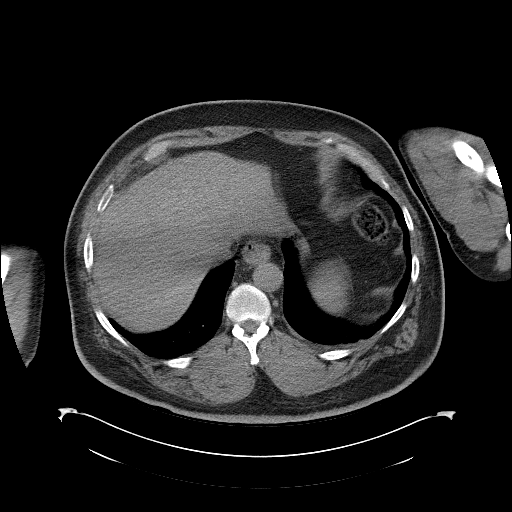
[im 97/104  soft-tissue]
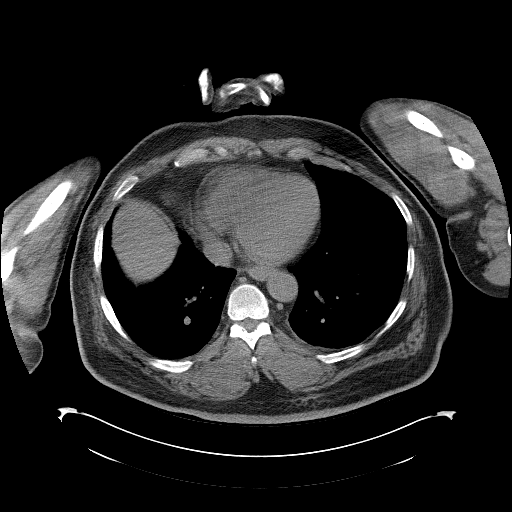

[Series 4: mpr cor (id) · coronal · 1.01mm/px · 3 of 102 slices shown]
[im 34/102  soft-tissue]
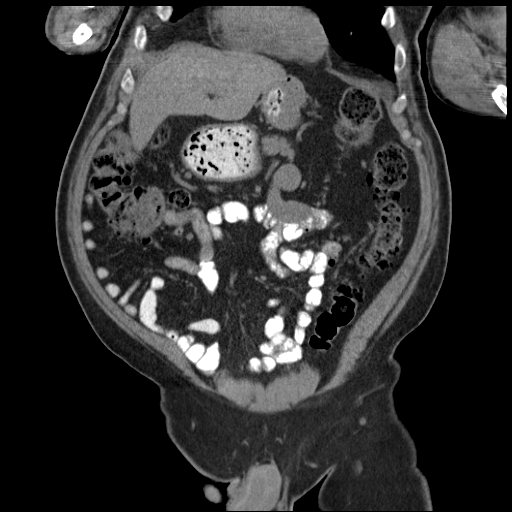
[im 45/102  soft-tissue]
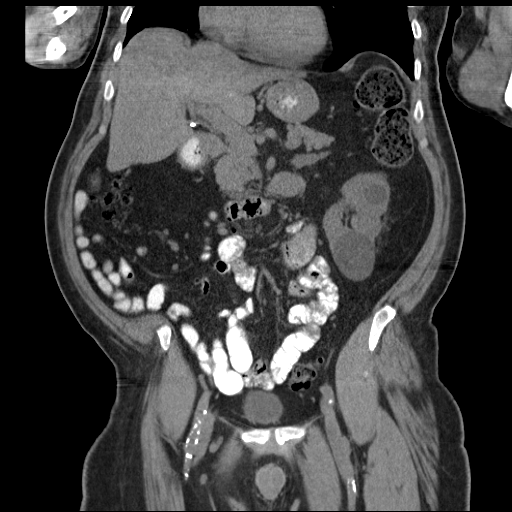
[im 57/102  soft-tissue]
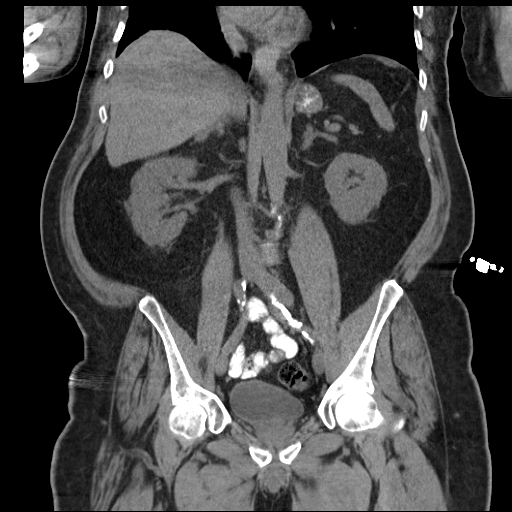

[16 of 46 positions shown; findings below may reference images not displayed]

FINDINGS: The lack of intravenous contrast limits the ability to evaluate
solid abdominal organs.

Normal hepatic contour.  Post cholecystectomy.  No ascites.

The previously characterized bilateral renal cysts are grossly
unchanged, though incompletely evaluated on this noncontrast
examination.  There is grossly unchanged mild bilateral likely age
related perinephric stranding.  No renal stones.  No urinary
obstruction.  There is unchanged mild diffuse thickening of the
bilateral adrenal glands without discrete nodule.  Normal
noncontrast appearance of the pancreas and spleen.

Ingested enteric contrast extends to the level of the distal small
bowel.  Moderate to large colonic stool burden without definite
evidence of obstruction.  Colonic diverticulosis without evidence
of diverticulitis on this noncontrast examination.  Normal
noncontrast appearance of the retrocecal appendix.  No
pneumoperitoneum, pneumatosis or portal venous gas.  Scattered
atherosclerotic calcifications within the nonaneurysmal abdominal
aorta.  No retroperitoneal, mesenteric, pelvic or inguinal
lymphadenopathy.  Pelvic organs are normal.  No free fluid in the
pelvis.

Limited visualization of the lower thorax is negative for focal
airspace opacity or pleural effusion.

No acute or aggressive osseous abnormalities.  Old right-sided
posterior rib fractures. There is unchanged sclerosis and endplate
deformity within the L2 vertebral body, grossly stable since the
[DATE] examination. Small mesenteric fat containing periumbilical
hernia.
IMPRESSION: 1.  Moderate to large colonic stool burden without definite
evidence of enteric obstruction.
2.  Extensive colonic diverticulosis without definite evidence of
diverticulitis.
3.  Unchanged increased sclerosis and deformity inferior endplate
of the L2 vertebral body, grossly stable since 01/16/2011
examination, previously biopsied with benign pathology.

## 2012-12-14 MED ORDER — OXYCODONE-ACETAMINOPHEN 5-325 MG PO TABS: ORAL_TABLET | ORAL | Status: DC

## 2012-12-14 MED ORDER — ALLOPURINOL 300 MG PO TABS: 300.0000 mg | ORAL_TABLET | Freq: Every day | ORAL | Status: DC

## 2012-12-14 NOTE — Telephone Encounter (Signed)
meds refilled 

## 2012-12-21 ENCOUNTER — Encounter (HOSPITAL_COMMUNITY): Payer: Self-pay | Admitting: Oncology

## 2012-12-21 ENCOUNTER — Encounter (HOSPITAL_COMMUNITY): Payer: Medicare Other | Attending: Oncology | Admitting: Oncology

## 2012-12-21 VITALS — BP 123/77 | HR 73 | Temp 98.2°F | Resp 18 | Ht 73.0 in

## 2012-12-21 DIAGNOSIS — C07 Malignant neoplasm of parotid gland: Secondary | ICD-10-CM

## 2012-12-21 DIAGNOSIS — R600 Localized edema: Secondary | ICD-10-CM

## 2012-12-21 HISTORY — DX: Localized edema: R60.0

## 2012-12-21 NOTE — Progress Notes (Signed)
Problem #1 new diagnosis of a polymorphous low-grade adenocarcinoma the right parotid gland status post resection but with apparent positive margins. Problem #2 CVA with residual right sided facial arm and leg weakness, unable to walk Problem #3 history of hypercalcemia in the past unclear etiology Problem #4 lymphoma treated at [facility] in 19/74 radiation therapy either to the neck or the base of the skull. Those records are not available to me Problem #5 diabetes mellitus Problem #6 BPH Problem #7 GERD Problem #8 poor dental hygiene Problem #9 history of gout Problem #10 hypercholesterolemia Problem #11  Elderly gentleman with many many problems who was noticed by himself and his wife have a growth on the right side of his face in front of his ear in October and he grew quickly they state. He was referred to Dr. Benjamine Mola who did definitive surgery recently. After discussing his case with pathology they felt that his margins were clearly suspicious for being positive.  He has not had weight loss, fevers, chills. He has healed well from the surgery and his oncology review of systems is otherwise negative. He still cannot walk still has residual significant weakness in his right leg right foot right hand which is essentially totally dysfunctional and right lower facial weakness.  Vital signs are stable. Lymph nodes are negative throughout. He has no obvious organomegaly on abdominal exam. Heart shows a regular rhythm and rate without murmur rub or gallop. Lungs are clear to auscultation and percussion. Skin exam shows numerous sebaceous cyst on his back so which have been operated on. Facial symmetry is unchanged from when we saw him previously with right lower facial weakness and esotropia of the  left eye. He still has vision out of the left eye however. The right facial/neck surgical scar is well-healed and I cannot feel any distinct masses. He does have a soft mass under his last year at the angle of the  jaw essentially consistent with a cyst. I feel this is benign.  He has edema of both legs 1-2+ on the right and trace to 1+ on the left.  He understands everything we talked about. His wife is with him today. They understand that he needs a radiation therapy consultation. I wonder in retrospect whether the radiation therapy in 1974 could well have been the cause of this unusual tumor. We will see him in 6 months. We will not order any blood work or further testing at this point in time until he sees the radiation therapist in consultation.

## 2012-12-21 NOTE — Patient Instructions (Addendum)
[facility] Discharge Instructions  RECOMMENDATIONS MADE BY THE CONSULTANT AND ANY TEST RESULTS WILL BE SENT TO YOUR REFERRING PHYSICIAN.  EXAM FINDINGS BY THE PHYSICIAN TODAY AND SIGNS OR SYMPTOMS TO REPORT TO CLINIC OR PRIMARY PHYSICIAN: discussion by MD.  Will get you scheduled to be seen by Radiation Oncology in [place].  MEDICATIONS PRESCRIBED:  none  INSTRUCTIONS GIVEN AND DISCUSSED: Report uncontrolled pain, swelling of face, etc.  SPECIAL INSTRUCTIONS/FOLLOW-UP: Radiation therapy consult and to return here for follow-up in 6 months.   Thank you for choosing [facility] to provide your oncology and hematology care.  To afford each patient quality time with our providers, please arrive at least 15 minutes before your scheduled appointment time.  With your help, our goal is to use those 15 minutes to complete the necessary work-up to ensure our physicians have the information they need to help with your evaluation and healthcare recommendations.    Effective January 1st, 2014, we ask that you re-schedule your appointment with our physicians should you arrive 10 or more minutes late for your appointment.  We strive to give you quality time with our providers, and arriving late affects you and other patients whose appointments are after yours.    Again, thank you for choosing [facility]  Our hope is that these requests will decrease the amount of time that you wait before being seen by our physicians.       _____________________________________________________________  Should you have questions after your visit to [facility], please contact our office at [phone] between the hours of 8:30 a.m. and 5:00 p.m.  Voicemails left after 4:30 p.m. will not be returned until the following business day.  For prescription refill requests, have your pharmacy contact our office with your prescription refill request.

## 2012-12-25 ENCOUNTER — Encounter: Payer: Self-pay | Admitting: Radiation Oncology

## 2012-12-26 ENCOUNTER — Encounter: Payer: Self-pay | Admitting: Radiation Oncology

## 2012-12-26 ENCOUNTER — Ambulatory Visit
Admission: RE | Admit: 2012-12-26 | Discharge: 2012-12-26 | Disposition: A | Discharge status: Home / Self Care | Payer: Medicare Other | Source: Ambulatory Visit | Attending: Radiation Oncology | Admitting: Radiation Oncology

## 2012-12-26 VITALS — BP 120/81 | HR 77 | Temp 98.1°F | Resp 20 | Ht 73.0 in

## 2012-12-26 DIAGNOSIS — H409 Unspecified glaucoma: Secondary | ICD-10-CM | POA: Insufficient documentation

## 2012-12-26 DIAGNOSIS — K529 Noninfective gastroenteritis and colitis, unspecified: Secondary | ICD-10-CM | POA: Insufficient documentation

## 2012-12-26 DIAGNOSIS — N289 Disorder of kidney and ureter, unspecified: Secondary | ICD-10-CM | POA: Insufficient documentation

## 2012-12-26 DIAGNOSIS — K5790 Diverticulosis of intestine, part unspecified, without perforation or abscess without bleeding: Secondary | ICD-10-CM | POA: Insufficient documentation

## 2012-12-26 DIAGNOSIS — K219 Gastro-esophageal reflux disease without esophagitis: Secondary | ICD-10-CM | POA: Insufficient documentation

## 2012-12-26 DIAGNOSIS — K449 Diaphragmatic hernia without obstruction or gangrene: Secondary | ICD-10-CM | POA: Insufficient documentation

## 2012-12-26 DIAGNOSIS — I69998 Other sequelae following unspecified cerebrovascular disease: Secondary | ICD-10-CM | POA: Insufficient documentation

## 2012-12-26 DIAGNOSIS — C07 Malignant neoplasm of parotid gland: Secondary | ICD-10-CM

## 2012-12-26 DIAGNOSIS — E782 Mixed hyperlipidemia: Secondary | ICD-10-CM | POA: Insufficient documentation

## 2012-12-26 DIAGNOSIS — N400 Enlarged prostate without lower urinary tract symptoms: Secondary | ICD-10-CM | POA: Insufficient documentation

## 2012-12-26 DIAGNOSIS — R29898 Other symptoms and signs involving the musculoskeletal system: Secondary | ICD-10-CM | POA: Insufficient documentation

## 2012-12-26 DIAGNOSIS — E785 Hyperlipidemia, unspecified: Secondary | ICD-10-CM | POA: Insufficient documentation

## 2012-12-26 DIAGNOSIS — Z923 Personal history of irradiation: Secondary | ICD-10-CM | POA: Insufficient documentation

## 2012-12-26 DIAGNOSIS — Z79899 Other long term (current) drug therapy: Secondary | ICD-10-CM | POA: Insufficient documentation

## 2012-12-26 DIAGNOSIS — E119 Type 2 diabetes mellitus without complications: Secondary | ICD-10-CM | POA: Insufficient documentation

## 2012-12-26 DIAGNOSIS — H5000 Unspecified esotropia: Secondary | ICD-10-CM | POA: Insufficient documentation

## 2012-12-26 DIAGNOSIS — Z87898 Personal history of other specified conditions: Secondary | ICD-10-CM | POA: Insufficient documentation

## 2012-12-26 DIAGNOSIS — K7581 Nonalcoholic steatohepatitis (NASH): Secondary | ICD-10-CM | POA: Insufficient documentation

## 2012-12-26 DIAGNOSIS — I10 Essential (primary) hypertension: Secondary | ICD-10-CM | POA: Insufficient documentation

## 2012-12-26 DIAGNOSIS — Z8673 Personal history of transient ischemic attack (TIA), and cerebral infarction without residual deficits: Secondary | ICD-10-CM | POA: Insufficient documentation

## 2012-12-26 HISTORY — DX: Malignant neoplasm of parotid gland: C07

## 2012-12-26 HISTORY — DX: Monocular esotropia, left eye: H50.012

## 2012-12-26 HISTORY — DX: Benign prostatic hyperplasia without lower urinary tract symptoms: N40.0

## 2012-12-26 HISTORY — DX: Personal history of irradiation: Z92.3

## 2012-12-26 HISTORY — DX: Unspecified esotropia: H50.00

## 2012-12-26 HISTORY — DX: Facial weakness: R29.810

## 2012-12-26 HISTORY — DX: Localized edema: R60.0

## 2012-12-26 NOTE — Progress Notes (Signed)
Please see the Nurse Progress Note in the MD Initial Consult Encounter for this patient. 

## 2012-12-26 NOTE — Progress Notes (Addendum)
Pt c/o soreness of right side of neck, denies loss of appetite, difficulty chewing/swallowing, fatigue. Pt unable to stand, could not be weighed; has right-sided hemiparesis s/p stroke 1998. Pt uses motorized wheelchair at home, gets self into and out of chair per wife. Pt received radiation to right side of base of skull area at [facility] in 1974 for lymphoma. There is a scar in this area. Pt denies further radiation tx.

## 2012-12-26 NOTE — Progress Notes (Signed)
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 male  with multiple comorbidities. Comorbidities include diabetes, stroke which has left him with right sided weakness. He uses a motorized wheelchair at home.   He received radiotherapy to either the neck or the base of skull at [facility] in [place] for lymphoma. He does not believe he received chemotherapy. He believes he received about 3 weeks of radiation. This was on the right side. He has not had recurrence of lymphoma since then.  He most recently presented with a right upper neck mass, approximately in July, which progressively grew. He was referred to Dr. Benjamine Mola; and ultrasound showed a 2.3 cm mass concerning for malignancy. A biopsy on 11/01/2012 showed polymorphous low-grade adenocarcinoma. CT scan of the neck on 11-12-12 demonstrated a Ill-defined 3.1 cm mass at the tail of the right parotid gland. This was most concerning for primary parotid neoplasm. There was no definite adenopathy. On 11/24/2012, he underwent right parotidectomy. The tumor was unifocal, measuring 2.6 cm. It closely approached the cauterized margin by <0.1 cm. There is no perineural invasion or lymphovascular space invasion. Zero out of two lymph nodes are positive.    He denies dry mouth. He reports some postoperative sensory changes and throbbing in the right upper neck. He has bilateral lower ankle swelling, left more than right. He reports some chronic tenderness in his right lower quadrant of the abdomen. He has quit smoking years ago.  The patient has met with medical oncology. No plans for  systemic therapy to my knowledge.   PREVIOUS RADIATION THERAPY: Yes as above  PAST MEDICAL HISTORY:  has a past medical history of Barrett's esophagus; Steatohepatitis; DM (diabetes mellitus); Diverticulosis; GERD (gastroesophageal reflux disease); HTN (hypertension); CVA (cerebral infarction) (1998); Gout; Lymphoma (1974); Hepatitis; Stroke (1998); Renal insufficiency; 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 (07/15/2011); Hiatal hernia; Colon polyp (03/23/2011); Hemorrhoids, internal (03/23/2011); Hyperlipidemia; Hemorrhagic colitis (06/06/2012.); Glaucoma (increased eye pressure); Arthritis; Asthma; Cancer of parotid gland (11/23/12); Lower facial weakness; Esotropia of left eye; Edema of lower extremity (12/21/12); BPH (benign prostatic hyperplasia); and radiation therapy (1974).    PAST SURGICAL HISTORY: Past Surgical History  Procedure Date  . Cholecystectomy   . Right lymph node removal   . Right video-assisted thoracic surgery, pleurectomy, and pleurodesis 2011  . Esophagogastroduodenoscopy 02/05/08    goblet cell metaplasia/negative for H.pylori  . Colonoscopy 03/23/11    Dr. Gala Romney  pancolonic diverticula, hemorrhoids, tubular adenoma.. next tcs 03/2016  . Esophagogastroduodenoscopy 03/23/11    Dr. Gala Romney, barretts, hiatal hernia  . Mass biopsy 11/01/2012    Procedure: NECK MASS BIOPSY;  Surgeon: Ascencion Dike, MD;  Location: [facility];  Service: ENT;  Laterality: Right;  Excisional Bx Right Neck Mass; attempted external jugular cutdown of left side  . Parotidectomy 11/24/2012    Procedure: PAROTIDECTOMY;  Surgeon: Ascencion Dike, MD;  Location: [place];  Service: ENT;  Laterality: N/A;  Total parotidectomy  . Pleurectomy     FAMILY HISTORY: family history includes Heart failure in his father, mother, sister, and son.  SOCIAL HISTORY:  reports that he quit smoking about 15 years  ago. His smoking use included Cigarettes. He smoked 3 packs per day. He has never used smokeless tobacco. He reports  that he does not drink alcohol or use illicit drugs.  ALLERGIES: Review of patient's allergies indicates no known allergies.  MEDICATIONS:  Current Outpatient Prescriptions  Medication Sig Dispense Refill  . allopurinol (ZYLOPRIM) 300 MG tablet Take 1 tablet (300 mg total) by mouth daily.  90 tablet  1  . amLODipine (NORVASC) 10 MG tablet Take 1 tablet by mouth  daily  90 tablet  1  . cloNIDine (CATAPRES) 0.2 MG tablet Take 1 tablet (0.2 mg total) by mouth 3 (three) times daily.  270 tablet  1  . clotrimazole-betamethasone (LOTRISONE) cream Apply to affected area 2 times daily  45 g  1  . gabapentin (NEURONTIN) 300 MG capsule Take 1 capsule by mouth  twice a day and 2 capsules  by mouth at bedtime  360 capsule  1  . hydrochlorothiazide (HYDRODIURIL) 25 MG tablet Take 1 tablet (25 mg total) by mouth daily.  90 tablet  1  . insulin glargine (LANTUS) 100 UNIT/ML injection Inject 60 Units into the skin at bedtime.  15 mL  0  . insulin lispro (HUMALOG) 100 UNIT/ML injection Inject 14-17 Units into the skin 3 (three) times daily before meals. Uses according to sliding scale at home. And 14 units QAC      . linagliptin (TRADJENTA) 5 MG TABS tablet Take 1 tablet (5 mg total) by mouth daily.  90 tablet  1  . lisinopril (PRINIVIL,ZESTRIL) 20 MG tablet Take 1 tablet (20 mg total) by mouth daily.  90 tablet  1  . metoprolol succinate (TOPROL-XL) 50 MG 24 hr tablet Take 1 tablet (50 mg total) by mouth daily. Take with or immediately following a meal.  90 tablet  1  . oxyCODONE-acetaminophen (PERCOCET/ROXICET) 5-325 MG per tablet 1 tablet three times a day as needed for pain  90 tablet  0  . simvastatin (ZOCOR) 20 MG tablet Take 1 tablet by mouth at  bedtime  90 tablet  1  . ULORIC 40 MG tablet Take 1 tablet by mouth  daily  90 each  1    REVIEW OF SYSTEMS: As above    PHYSICAL EXAM:  height is 6' 1"  (1.854 m). His oral temperature is 98.1 F (36.7 C). His blood pressure is 120/81 and his pulse is 77.  His respiration is 20.   General: Alert and oriented, in no acute distress. Sitting in a wheelchair. HEENT: Head is normocephalic. Left eye is severely deviated laterally. Oropharynx is notable for poor dentition. Neck: Postoperative change in the right parotid region. No palpable lymphadenopathy in the upper/lower neck bilaterally.  Heart: Regular in rate and rhythm with no murmurs, rubs, or gallops. Chest: Clear to auscultation bilaterally, with no rhonchi, wheezes, or rales. Abdomen: Mild tenderness in the right lower quadrant- chronic  Extremities: Bilateral lower ankle edema, left greater than right  Lymphatics: No concerning lymphadenopathy. Skin: No concerning lesions. Musculoskeletal: Significant right-sided weakness in the upper and lower extremities  Neurologic: Deviation of left eye as above Psychiatric: Judgment and insight are intact. Affect is appropriate.    LABORATORY DATA:  Lab Results  Component Value Date   WBC 8.1 11/23/2012   HGB 15.1 11/23/2012   HCT 45.8 11/23/2012   MCV 72.7* 11/23/2012   PLT 260 11/23/2012   CMP     Component Value Date/Time   NA 141 11/23/2012 0943   NA 138 08/16/2011  0957   K 4.5 11/23/2012 0943   K 4.3 08/16/2011 0957   CL 100 11/23/2012 0943   CO2 28 11/23/2012 0943   GLUCOSE 78 11/23/2012 0943   BUN 20 11/23/2012 0943   BUN 34* 01/25/2012   BUN 23 08/16/2011 0957   CREATININE 1.05 11/23/2012 0943   CREATININE 1.27 10/25/2012 1044   CREATININE 1.5* 01/25/2012   CALCIUM 11.6* 11/23/2012 0943   CALCIUM 10.3 08/16/2011 0957   CALCIUM 11.4* 08/12/2011 1349   PROT 8.2 10/25/2012 1044   ALBUMIN 3.8 10/25/2012 1044   AST 15 10/25/2012 1044   AST 14 08/16/2011 0957   ALT 18 10/25/2012 1044   ALKPHOS 92 10/25/2012 1044   ALKPHOS 80 08/16/2011 0957   BILITOT 0.3 10/25/2012 1044   BILITOT 0.4 07/30/2011 1546   GFRNONAA 57* 10/25/2012 1044   GFRAA 66* 10/25/2012 1044        RADIOGRAPHY: As above, reviewed at tumor conference      IMPRESSION/PLAN: The patient was reviewed at our multidisciplinary tumor board this morning, where I was present. The consensus is that it is appropriate to follow him with physical exams by otolaryngology. I do not think that radiotherapy is warranted. This is in light of the PLGA histology of his disease.  Furthermore, he has multiple comorbidities and his margins were technically negative. However regardless of his comorbidities and borderline negative margin, polymorphous low-grade adenocarcinoma tends to be a slow-growing cancer that can usually be salvaged surgically if it recurs. There is a lack of data to warrant radiotherapy in this clinical scenario. Also of note, this tumor was in a previously radiated area. It is difficult to know whether this is a radiation-induced malignancy.  I recommended that the patient follow with medical oncology as scheduled, and continue regular followups with otolaryngology.  They're pleased with this plan.   I spent 30 minutes minutes face to face with the patient and more than 50% of that time was spent in counseling and/or coordination of care.   __________________________________________   [name], MD

## 2012-12-26 NOTE — Addendum Note (Signed)
Encounter addended by: Andria Rhein, RN on: 12/26/2012  3:39 PM<BR>     Documentation filed: Charges VN

## 2012-12-28 ENCOUNTER — Encounter: Payer: Self-pay | Admitting: Family Medicine

## 2012-12-28 ENCOUNTER — Ambulatory Visit (INDEPENDENT_AMBULATORY_CARE_PROVIDER_SITE_OTHER): Payer: Medicare Other | Admitting: Family Medicine

## 2012-12-28 VITALS — BP 130/78 | HR 70 | Resp 18 | Ht 72.0 in

## 2012-12-28 DIAGNOSIS — N400 Enlarged prostate without lower urinary tract symptoms: Secondary | ICD-10-CM

## 2012-12-28 DIAGNOSIS — L720 Epidermal cyst: Secondary | ICD-10-CM

## 2012-12-28 DIAGNOSIS — C07 Malignant neoplasm of parotid gland: Secondary | ICD-10-CM

## 2012-12-28 DIAGNOSIS — M25512 Pain in left shoulder: Secondary | ICD-10-CM | POA: Insufficient documentation

## 2012-12-28 DIAGNOSIS — E119 Type 2 diabetes mellitus without complications: Secondary | ICD-10-CM

## 2012-12-28 DIAGNOSIS — L723 Sebaceous cyst: Secondary | ICD-10-CM

## 2012-12-28 DIAGNOSIS — M25519 Pain in unspecified shoulder: Secondary | ICD-10-CM

## 2012-12-28 DIAGNOSIS — M25511 Pain in right shoulder: Secondary | ICD-10-CM | POA: Insufficient documentation

## 2012-12-28 DIAGNOSIS — I10 Essential (primary) hypertension: Secondary | ICD-10-CM

## 2012-12-28 MED ORDER — TAMSULOSIN HCL 0.4 MG PO CAPS: 0.4000 mg | ORAL_CAPSULE | Freq: Every day | ORAL | Status: DC

## 2012-12-28 MED ORDER — OMEPRAZOLE 40 MG PO CPDR: 40.0000 mg | DELAYED_RELEASE_CAPSULE | Freq: Every day | ORAL | Status: DC

## 2012-12-28 NOTE — Assessment & Plan Note (Signed)
He will followup with endocrinology he is due to have repeat labs

## 2012-12-28 NOTE — Assessment & Plan Note (Signed)
Reviewed oncology note no radiation therapy at this time he will be followed with serial exams by ear nose and throat

## 2012-12-28 NOTE — Assessment & Plan Note (Signed)
Blood pressure looks good today no change the medication

## 2012-12-28 NOTE — Patient Instructions (Addendum)
Use the arthritis cream for the shoulder Continue pain medications Restart the flomax for his bladder/prostate Schedule appt with Dr. Dorris Fetch We will schedule with Dr. Romona Curls for the cyst F/U 3 months

## 2012-12-28 NOTE — Progress Notes (Signed)
  Subjective:    Patient ID: [name], male    DOB: December 01, 1946, 67 y.o.   MRN: [id number]  HPI Patient here to follow chronic medical problems. I reviewed his oncology note states that he does not need radiation therapy for his rare adenocarcinoma  of the parotid gland. He complains of bilateral shoulder pain his wife the knees and arthritis cream on the shoulders which is helps. He's using Percocet as prescribed for chronic pain of the abdomen. He has noticed that he is not urinating as much as he used to but denies any dysuria or change in abdominal pain fever odor to the urine. We did find that he has not had his Flomax for the past couple of weeks Diabetes mellitus his fasting blood sugars have been 150 or less his highest has been 187.    Review of Systems  GEN- denies fatigue, fever, weight loss,weakness, recent illness HEENT- denies eye drainage, change in vision, nasal discharge, CVS- denies chest pain, palpitations RESP- denies SOB, cough, wheeze ABD- denies N/V, change in stools,+ abd pain GU- denies dysuria, hematuria, dribbling, incontinence MSK- +joint pain, muscle aches, injury Neuro- denies headache, dizziness, syncope, seizure activity      Objective:   Physical Exam GEN- NAD, alert and oriented x3, sitting in wheelchair, obese HEENT- EOMI, non injected sclera, pink conjunctiva, MMM, oropharynx clear, Neck- TTP anterior right neck  CVS- RRR, no murmur RESP-CTAB EXT-1+ edema R >L Pulses- Radial, 2+ Neuro- right hemiparesis,  Skin-  right jawline s/p excision - scab with thickened skin no mass felt, NT, no bleeding, mild hypopigmentation  Left lower back 2cm epidermoid cyst, NT Axillar- no nodes felt MSK- right hemiparesis UE, left, decreased ROM, both TTP near [facility] joint       Assessment & Plan:

## 2012-12-28 NOTE — Assessment & Plan Note (Signed)
Restart Flomax

## 2012-12-28 NOTE — Assessment & Plan Note (Signed)
Will refer back to general surgery to have this removed in the office

## 2012-12-28 NOTE — Assessment & Plan Note (Signed)
This is secondary to likely arthritis and overuse of the left arm because of his right hemiparesis I do not feel any axillary nodes he has fair range of motion in the left arm. He can continue the topical anti-inflammatory and his current pain regimen

## 2013-01-01 NOTE — Addendum Note (Signed)
Encounter addended by: Deirdre Evener, RN on: 01/01/2013  6:34 PM<BR>     Documentation filed: Charges VN

## 2013-01-21 IMAGING — CR DG ABDOMEN ACUTE W/ 1V CHEST
3 series · 3 of 3 positions shown · non-contrast
Comparison: 04/29/2012

CLINICAL DATA: Chest pain.  Abdominal cramping.  Diarrhea.  Back
pain.  Shortness of breath.  Dysuria.

ACUTE ABDOMEN SERIES (ABDOMEN 2 VIEW & CHEST 1 VIEW)

[view not recorded (1 of 3)]
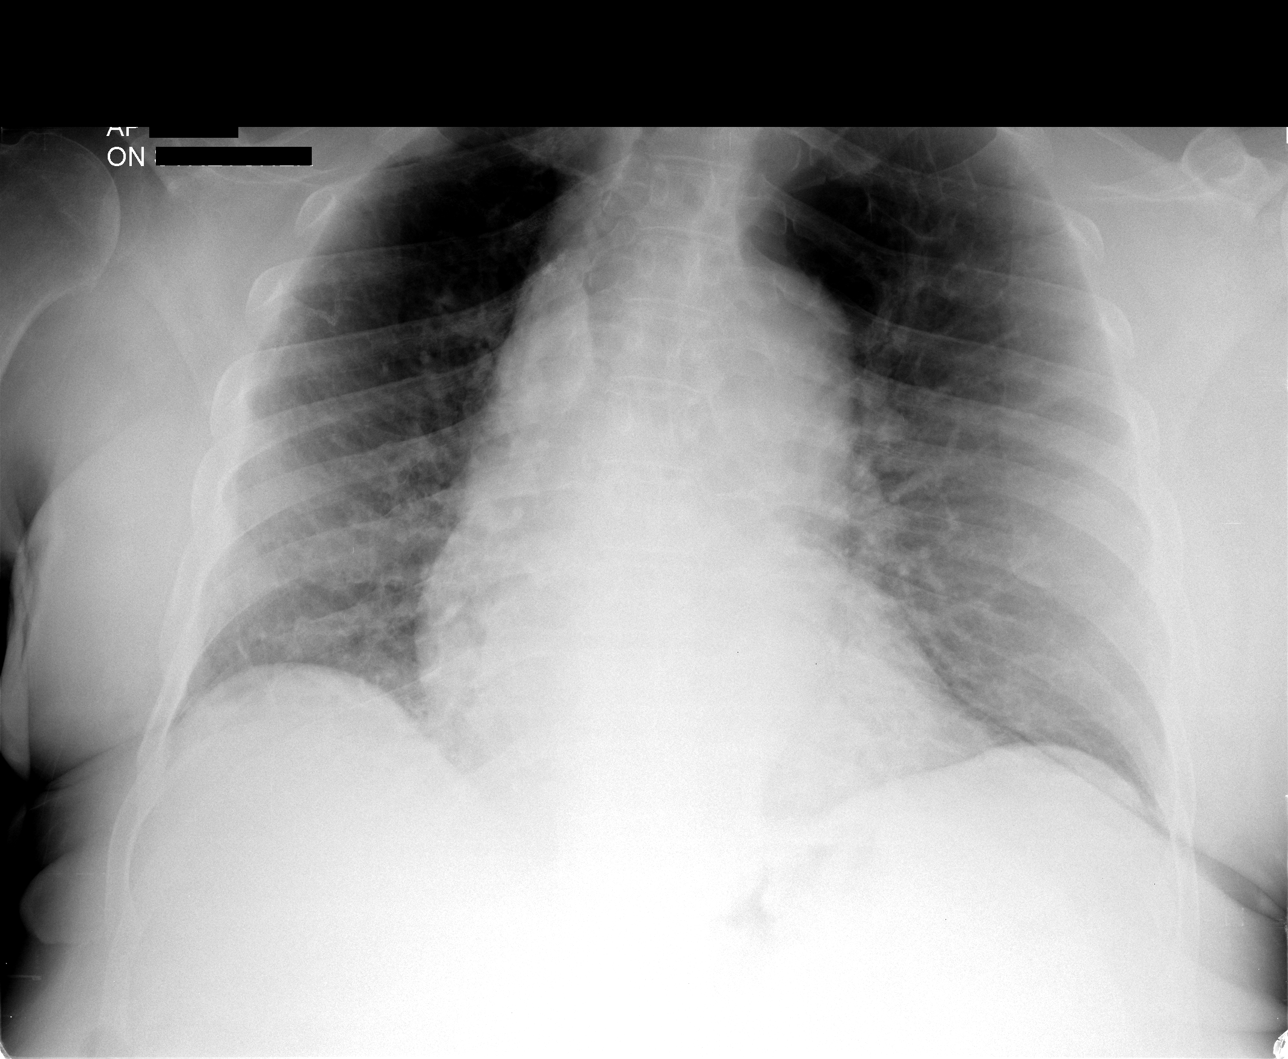

[view not recorded (2 of 3)]
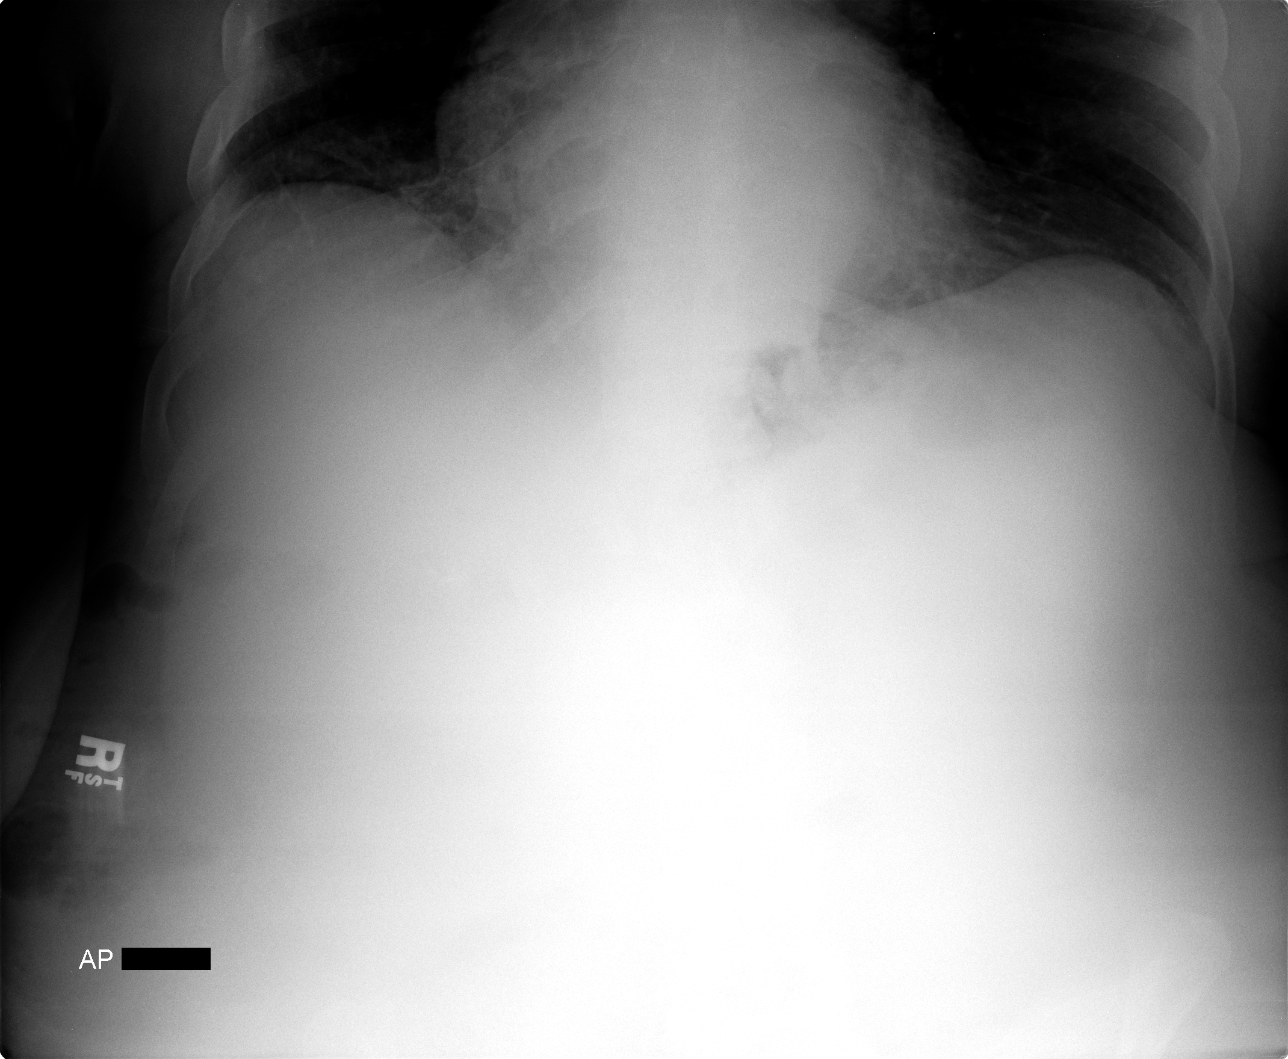

[view not recorded (3 of 3)]
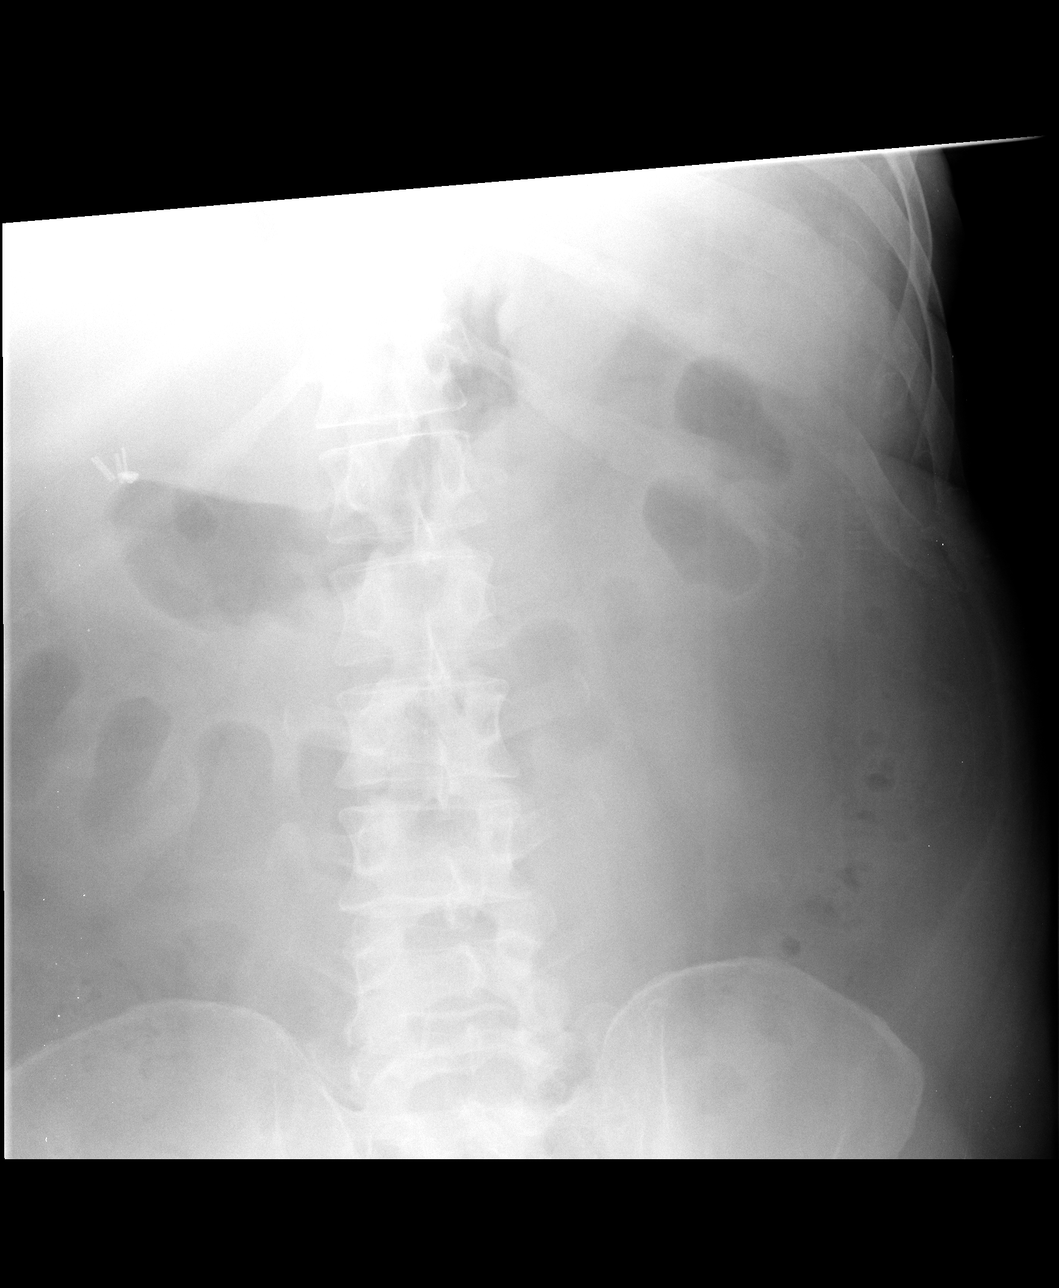

[3 of 3 positions shown; findings below may reference images not displayed]

FINDINGS: Tortuous thoracic aorta noted.  Emphysema is present with
rib deformities related to remote rib fractures, and mild chronic
interstitial accentuation.  Wedge resection clips noted in the
right lung.

No free intraperitoneal gas is observed beneath the hemidiaphragms.
No abnormal air-fluid levels. Technical factors related to patient
body habitus reduce diagnostic sensitivity and specificity.

No dilated bowel observed.
IMPRESSION: 1.  Unremarkable bowel gas pattern.
2.  Emphysema with chronic interstitial accentuation.
3.  Tortuous thoracic aorta.

## 2013-01-21 IMAGING — CT CT ABD-PELV W/ CM
3 of 5 series · 14 of 32 positions shown, 19 images · IV contrast (omnipaque)
Comparison: 04/29/2012

CLINICAL DATA: Abdominal pain

CT ABDOMEN AND PELVIS WITH CONTRAST
TECHNIQUE: Multidetector CT imaging of the abdomen and pelvis was
performed following the standard protocol during bolus
administration of intravenous contrast.
Contrast: 100mL OMNIPAQUE IOHEXOL 300 MG/ML  SOLN

[Series 2: routine abdomen · axial · 0.86mm/px · z∈[-483,-183]mm · 4 of 100 slices shown, 9 images]
[im 20/100  soft-tissue]
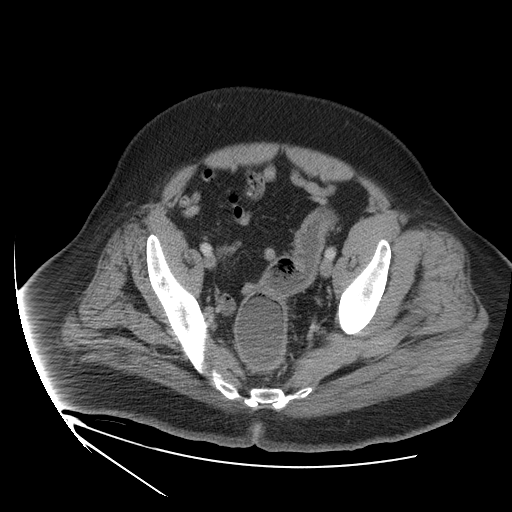
[im 20/100  lung]
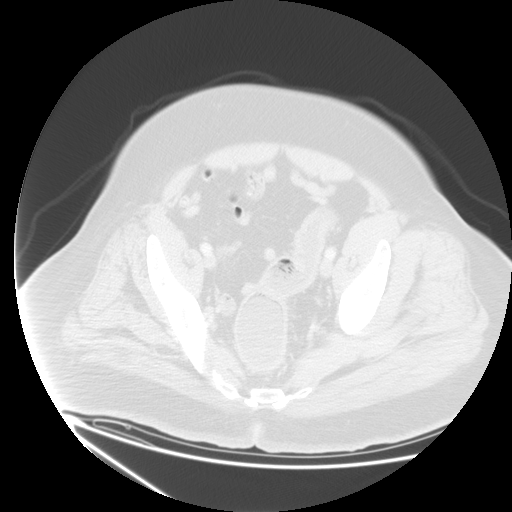
[im 20/100  bone]
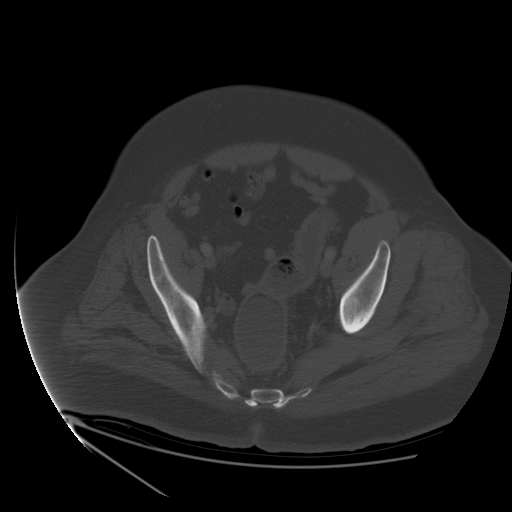
[im 40/100  soft-tissue]
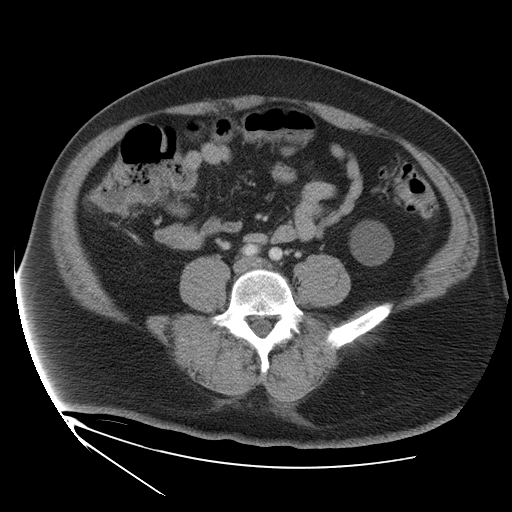
[im 40/100  lung]
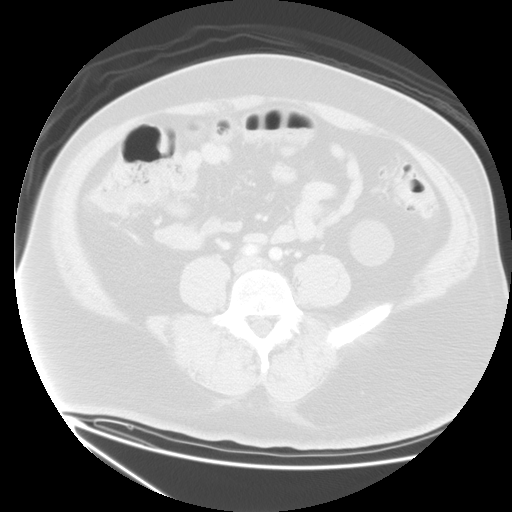
[im 60/100  soft-tissue]
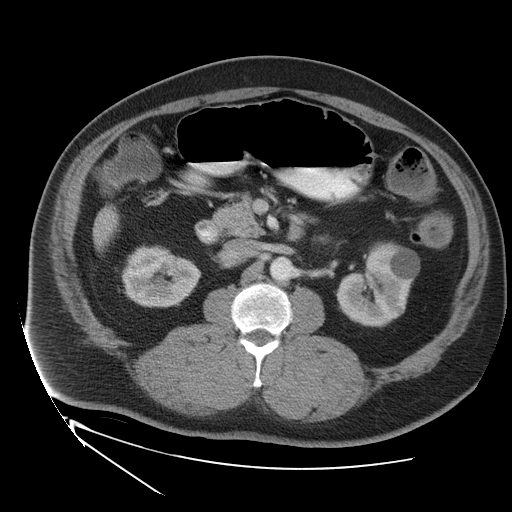
[im 60/100  lung]
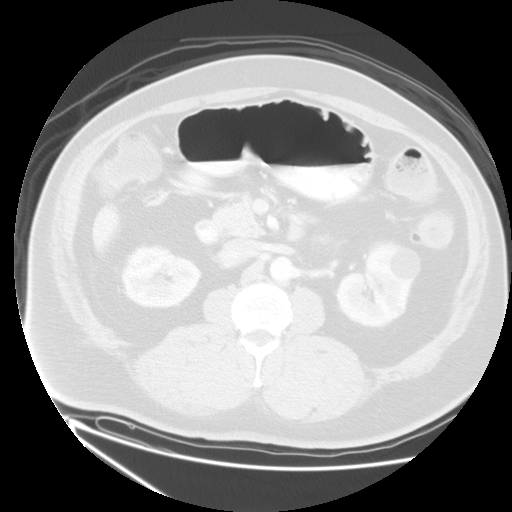
[im 80/100  soft-tissue]
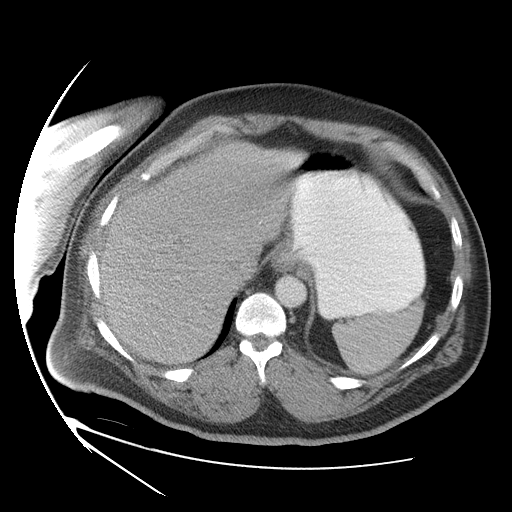
[im 80/100  lung]
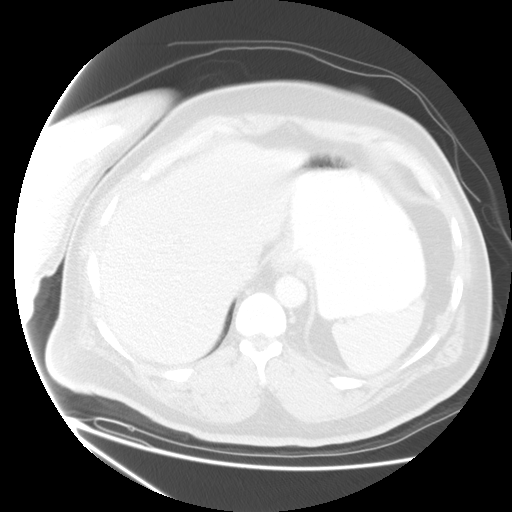

[Series 5: renal delays · axial · 0.77mm/px · z∈[-317,-222]mm · 2 of 59 slices shown]
[im 20/59  soft-tissue]
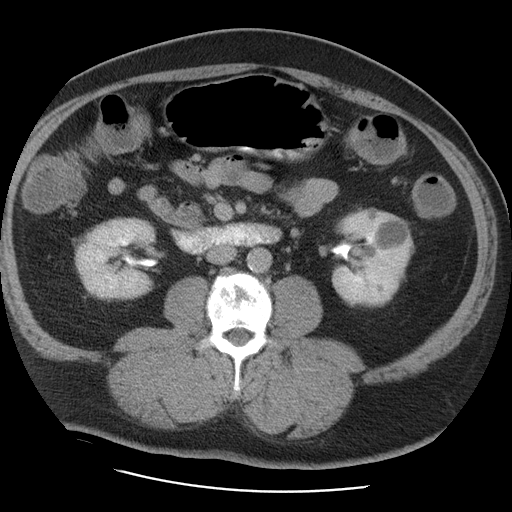
[im 39/59  soft-tissue]
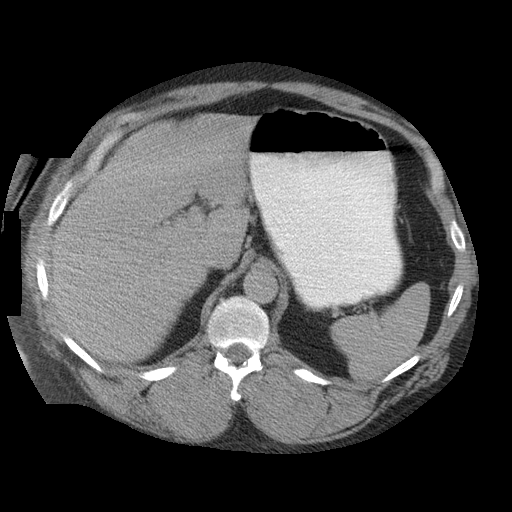

[Series 401: sag · sagittal · 0.99mm/px · 8 of 142 slices shown]
[im 16/142  soft-tissue]
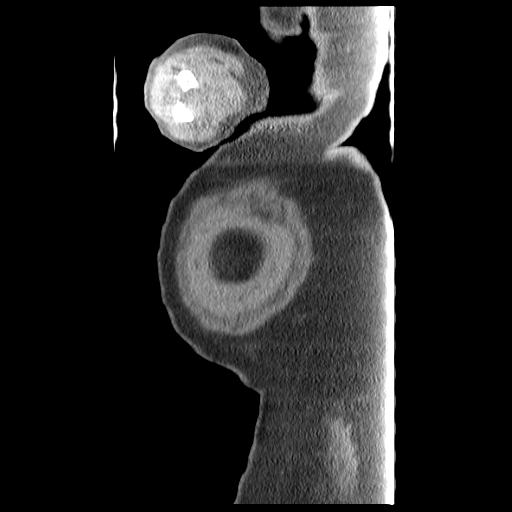
[im 32/142  soft-tissue]
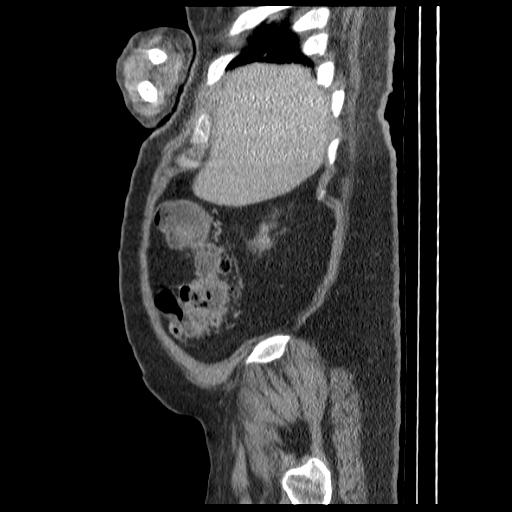
[im 48/142  soft-tissue]
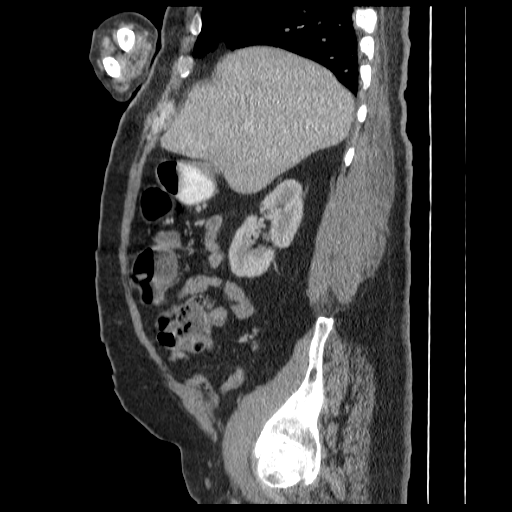
[im 63/142  soft-tissue]
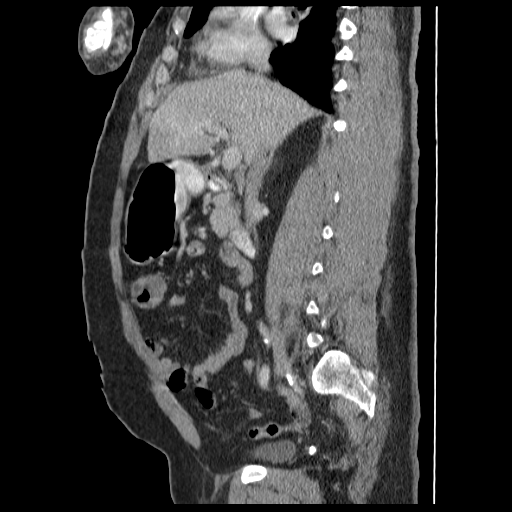
[im 79/142  soft-tissue]
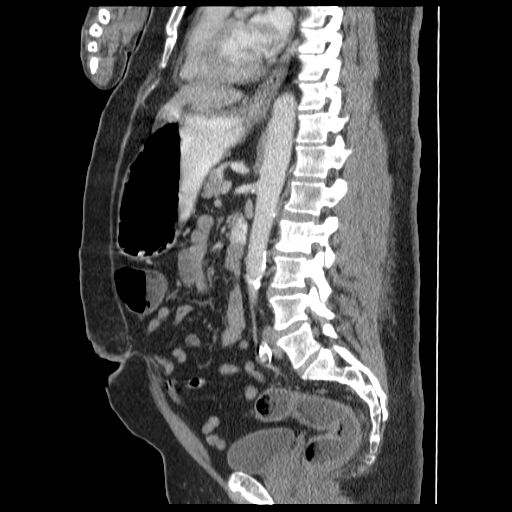
[im 95/142  soft-tissue]
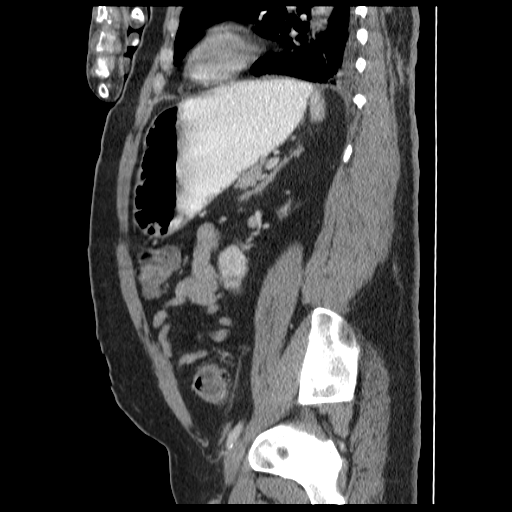
[im 110/142  soft-tissue]
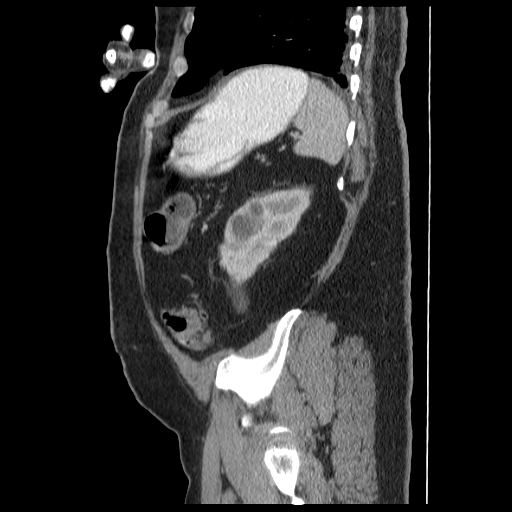
[im 126/142  soft-tissue]
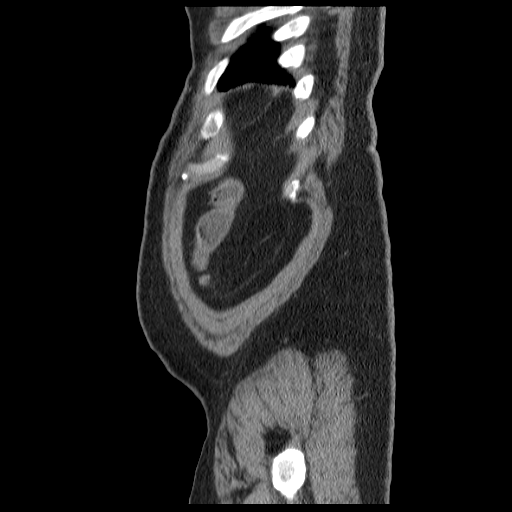

[14 of 32 positions shown; findings below may reference images not displayed]

FINDINGS: Tiny right pleural effusion.  Small left pleural
effusion.  Dependent atelectasis.  Cysts in the left lung have a
chronic appearance.

Healing right-sided rib fractures with deformity.  No pneumothorax.

Small amount of pericardial fluid is likely physiologic.

Post cholecystectomy.  Liver, pancreas, spleen, adrenal glands
stable.  Renal cysts are stable.

There is wall thickening of the sigmoid colon and rectum which is
mild.  This is associated with diverticulosis.  Little if any
stranding in the adjacent fat.  No extraluminal bowel gas.  No
abscess.

Small umbilical hernia containing adipose tissue.

Normal appendix.

Bladder and prostate are unremarkable.

Stable mixed sclerotic and lytic changes of L2 with an inferior
endplate deformity.
IMPRESSION: Wall thickening of the sigmoid colon and rectum worrisome for
inflammatory process.  Consider diverticulitis, infectious colitis,
inflammatory bowel disease.

Small left pleural effusion.  Tiny right pleural effusion.

Otherwise stable study.

## 2013-01-31 ENCOUNTER — Ambulatory Visit: Payer: Medicare Other | Admitting: Family Medicine

## 2013-02-01 ENCOUNTER — Ambulatory Visit: Payer: Medicare Other | Admitting: Family Medicine

## 2013-02-20 ENCOUNTER — Encounter: Payer: Self-pay | Admitting: "Endocrinology

## 2013-03-07 ENCOUNTER — Ambulatory Visit (INDEPENDENT_AMBULATORY_CARE_PROVIDER_SITE_OTHER): Payer: Medicare Other | Admitting: Otolaryngology

## 2013-03-07 DIAGNOSIS — C07 Malignant neoplasm of parotid gland: Secondary | ICD-10-CM

## 2013-03-28 IMAGING — CT CT ABD-PELV W/ CM
2 of 4 series · 16 of 46 positions shown, 18 images · IV contrast (Omnipaque 300)
Comparison: 06/06/2012

CLINICAL DATA: Pain, hepatitis.  Diabetes.

CT ABDOMEN AND PELVIS WITH CONTRAST
TECHNIQUE: Multidetector CT imaging of the abdomen and pelvis was
performed following the standard protocol during bolus
administration of intravenous contrast.
Contrast: 100mL OMNIPAQUE IOHEXOL 300 MG/ML  SOLN

[Series 2: abd_pel_with 5.0 b40s · axial · 0.90mm/px · z∈[+390,+864]mm · 13 of 107 slices shown, 15 images]
[im 6/107  soft-tissue]
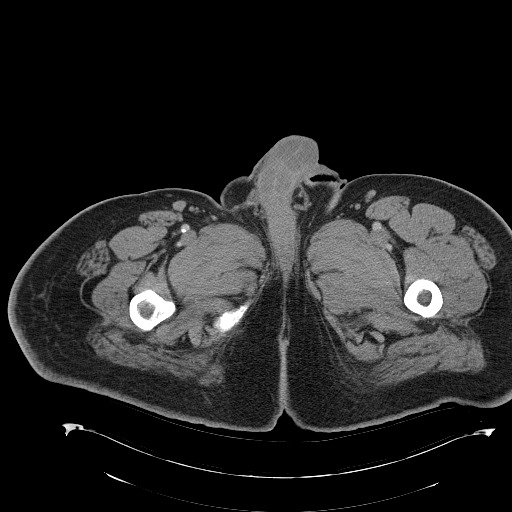
[im 6/107  bone]
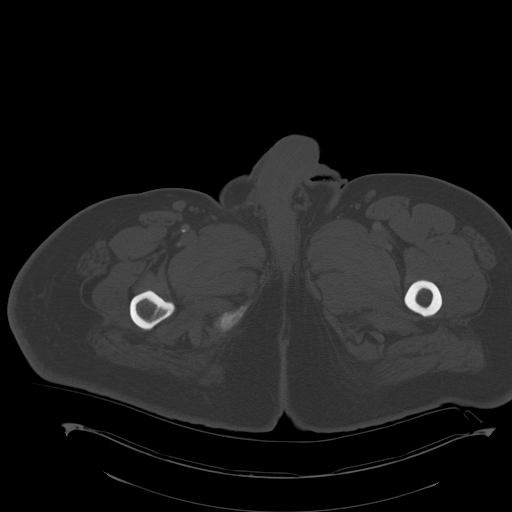
[im 16/107  soft-tissue]
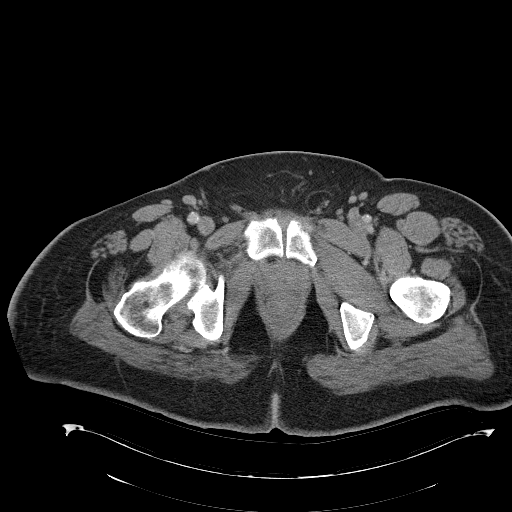
[im 21/107  soft-tissue]
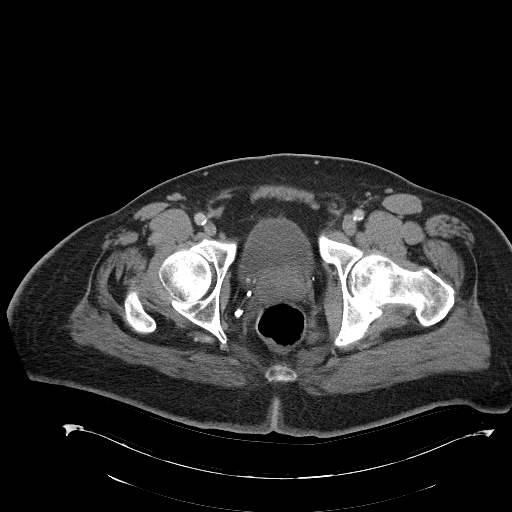
[im 31/107  soft-tissue]
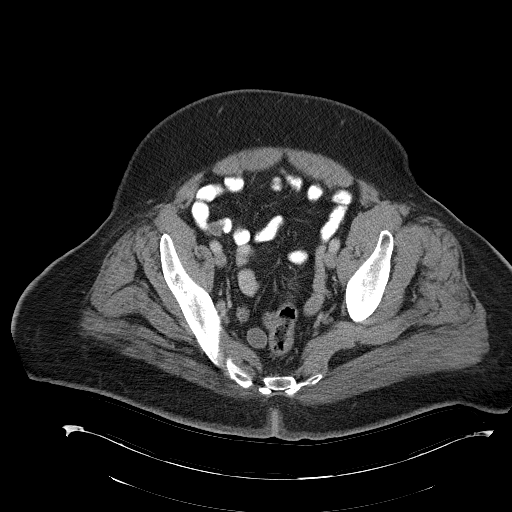
[im 36/107  soft-tissue]
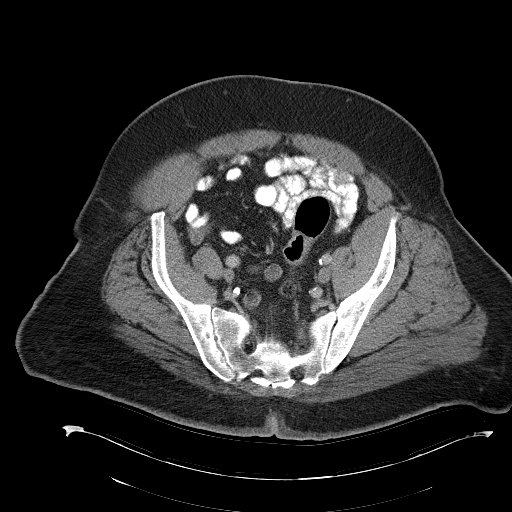
[im 46/107  soft-tissue]
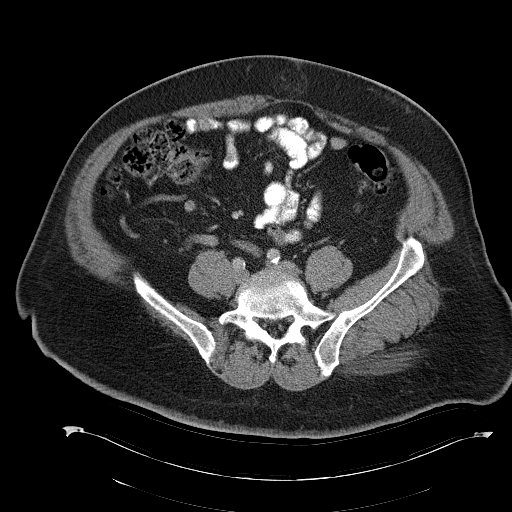
[im 56/107  soft-tissue]
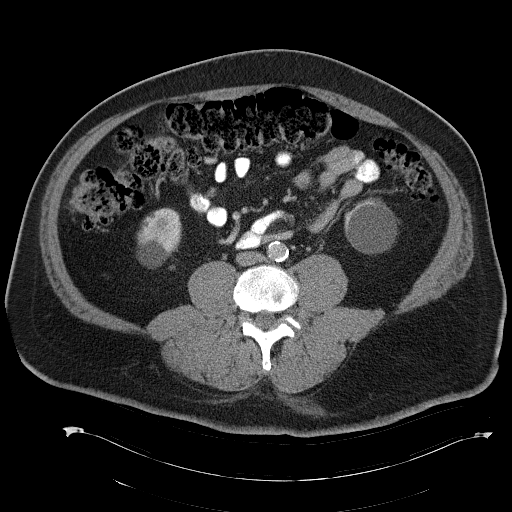
[im 61/107  soft-tissue]
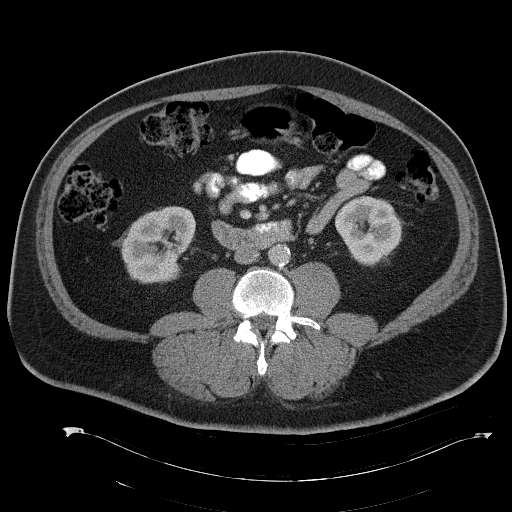
[im 71/107  soft-tissue]
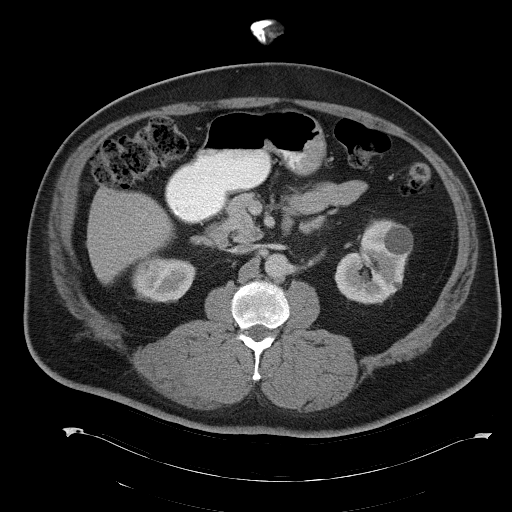
[im 71/107  bone]
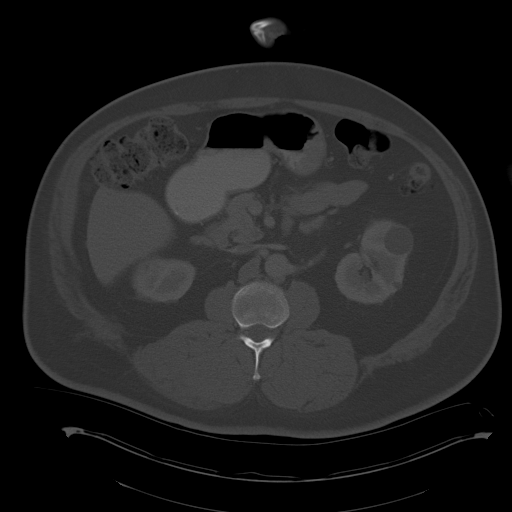
[im 76/107  soft-tissue]
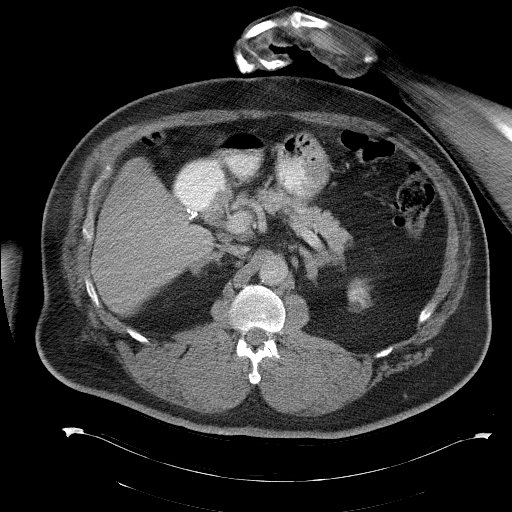
[im 86/107  soft-tissue]
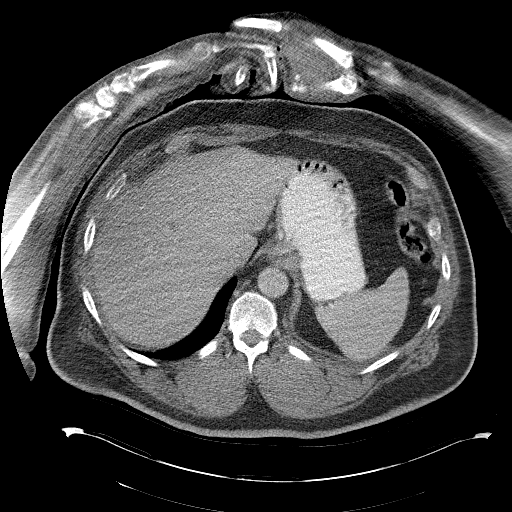
[im 91/107  soft-tissue]
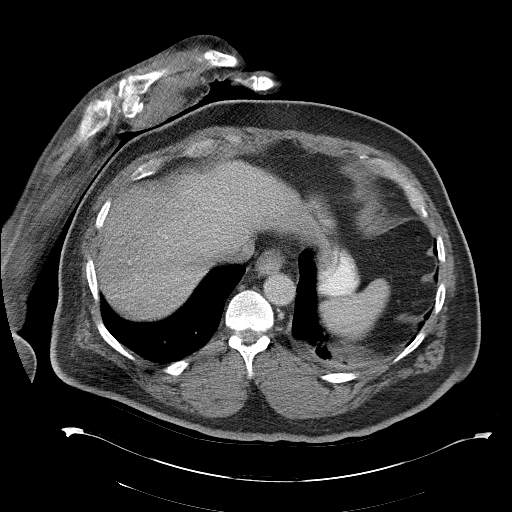
[im 101/107  soft-tissue]
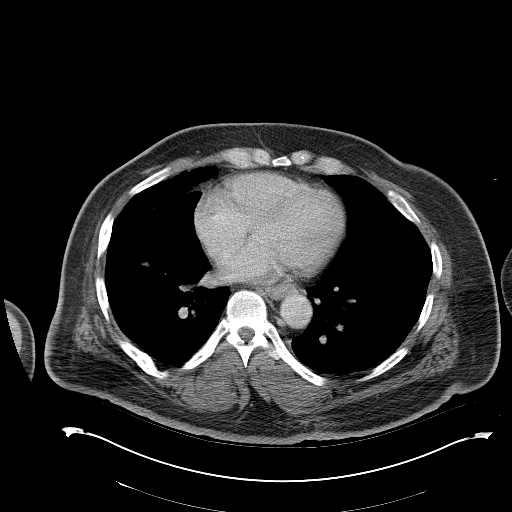

[Series 4: mpr cor post contrast (id) · coronal · 0.91mm/px · 3 of 135 slices shown]
[im 45/135  soft-tissue]
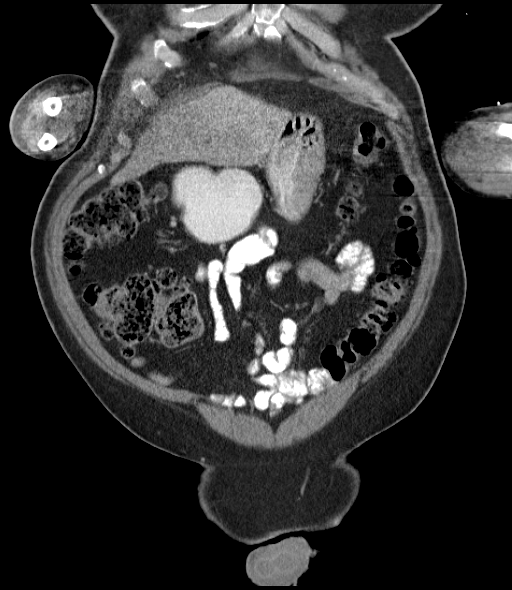
[im 60/135  soft-tissue]
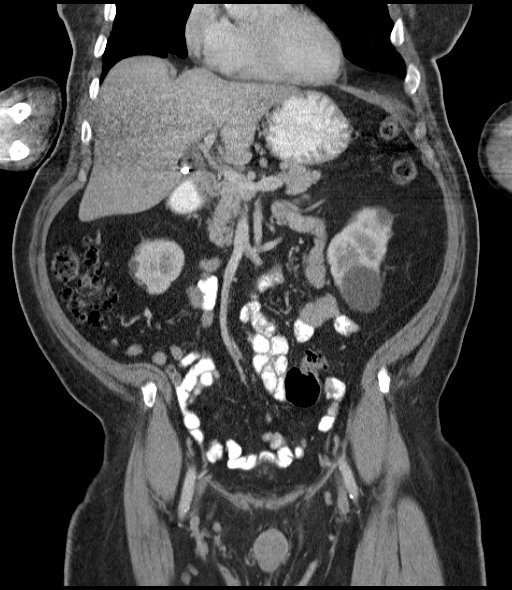
[im 75/135  soft-tissue]
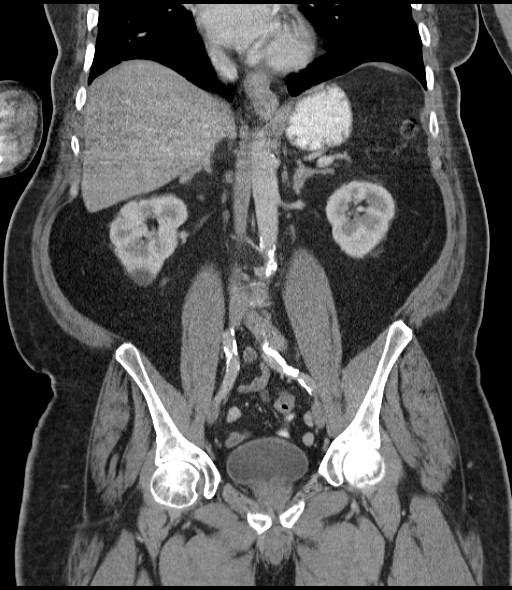

[16 of 46 positions shown; findings below may reference images not displayed]

FINDINGS: Small left effusion.  Prior cholecystectomy.

No significant biliary dilatation.  Several small low density
structures are noted scattered throughout the liver.  These are too
small to reliably characterize.
The pancreas appears within normal limits.  Normal appearance of
the spleen.

Both of the adrenal glands are normal.  Bilateral renal cysts are
identified.
No obstructive uropathy.  The urinary bladder appears normal.
Prostate gland and seminal vesicles are unremarkable.

Calcified atherosclerotic disease affects the abdominal aorta.
There is no adenopathy within the upper abdomen.  There is no
pelvic or inguinal adenopathy.  The stomach and the small bowel
loops appear within normal limits.  The appendix is visualized and
appears normal.  Moderate stool identified throughout the colon.
Multiple left-sided colonic diverticula identified without acute
inflammation.

There is a small periumbilical hernia containing fat only.

Chronic healed rib fracture deformities are noted on the right
posteriorly.  Similar appearance of the L2 vertebra with increase
sclerosis and deformity of the end plate. This has been previously
biopsied with a benign the pathology.
IMPRESSION: 1.  No acute findings identified within the abdomen or pelvis.
2.  Small left pleural effusion.
3.  Prior cholecystectomy.

## 2013-04-02 ENCOUNTER — Ambulatory Visit (INDEPENDENT_AMBULATORY_CARE_PROVIDER_SITE_OTHER): Payer: Medicare Other | Admitting: Family Medicine

## 2013-04-02 ENCOUNTER — Encounter: Payer: Self-pay | Admitting: Family Medicine

## 2013-04-02 VITALS — BP 106/72 | HR 68 | Resp 18 | Ht 72.0 in

## 2013-04-02 DIAGNOSIS — E119 Type 2 diabetes mellitus without complications: Secondary | ICD-10-CM

## 2013-04-02 DIAGNOSIS — H409 Unspecified glaucoma: Secondary | ICD-10-CM

## 2013-04-02 DIAGNOSIS — I69959 Hemiplegia and hemiparesis following unspecified cerebrovascular disease affecting unspecified side: Secondary | ICD-10-CM

## 2013-04-02 DIAGNOSIS — I639 Cerebral infarction, unspecified: Secondary | ICD-10-CM

## 2013-04-02 DIAGNOSIS — M67919 Unspecified disorder of synovium and tendon, unspecified shoulder: Secondary | ICD-10-CM

## 2013-04-02 DIAGNOSIS — I6350 Cerebral infarction due to unspecified occlusion or stenosis of unspecified cerebral artery: Secondary | ICD-10-CM

## 2013-04-02 DIAGNOSIS — L0293 Carbuncle, unspecified: Secondary | ICD-10-CM

## 2013-04-02 DIAGNOSIS — I10 Essential (primary) hypertension: Secondary | ICD-10-CM

## 2013-04-02 DIAGNOSIS — H5000 Unspecified esotropia: Secondary | ICD-10-CM

## 2013-04-02 DIAGNOSIS — H539 Unspecified visual disturbance: Secondary | ICD-10-CM

## 2013-04-02 DIAGNOSIS — M75102 Unspecified rotator cuff tear or rupture of left shoulder, not specified as traumatic: Secondary | ICD-10-CM

## 2013-04-02 DIAGNOSIS — L609 Nail disorder, unspecified: Secondary | ICD-10-CM

## 2013-04-02 DIAGNOSIS — L0292 Furuncle, unspecified: Secondary | ICD-10-CM

## 2013-04-02 MED ORDER — OMEPRAZOLE 40 MG PO CPDR: 40.0000 mg | DELAYED_RELEASE_CAPSULE | Freq: Every day | ORAL | Status: DC

## 2013-04-02 MED ORDER — OXYCODONE-ACETAMINOPHEN 5-325 MG PO TABS: ORAL_TABLET | ORAL | Status: DC

## 2013-04-02 MED ORDER — SULFAMETHOXAZOLE-TRIMETHOPRIM 800-160 MG PO TABS: 1.0000 | ORAL_TABLET | Freq: Two times a day (BID) | ORAL | Status: AC

## 2013-04-02 NOTE — Patient Instructions (Addendum)
Start antibiotics  Pain pill to be filled at [facility] Referral to Dr. Berline Lopes Referral to Dr. Aline Brochure for shoulder Referral to [facility] doctor  F/U 3 months

## 2013-04-05 ENCOUNTER — Telehealth: Payer: Self-pay | Admitting: Family Medicine

## 2013-04-05 NOTE — Telephone Encounter (Signed)
The company is [employer]. Can I get an order to fax to them at [phone]

## 2013-04-05 NOTE — Telephone Encounter (Signed)
Order written, I am sure he will need further documentation,

## 2013-04-07 NOTE — Assessment & Plan Note (Signed)
Concern for significant in ROM of left shoulder, which is his only functioning upper limb. Will have him see orthopedic surgery for evaluation Will also try to get him a new motorized wheelchair

## 2013-04-07 NOTE — Assessment & Plan Note (Signed)
Continues to follow with endocrine Referral to podiatry for nail/foot care othalmology

## 2013-04-07 NOTE — Assessment & Plan Note (Signed)
residual hemiparesis, no use of RUE. Requires assistance with all ADL's, needs new wheelchair

## 2013-04-07 NOTE — Assessment & Plan Note (Signed)
Well controlled 

## 2013-04-07 NOTE — Progress Notes (Addendum)
  Subjective:    Patient ID: [name], male    DOB: [birth year], 67 y.o.   MRN: [id number]  HPI  Pt here to f/u chronic medical problems. Has multiple concerns. Out of pain medication for his chronic abdominal pain Boil draining on back again, in spot where he had one cut off Left shoulder is getting worse, unable to raise as high before, no new injury Needs eye doctor and foot doctor referral- thinks eyes are getting worse, poor vision from both, wife unable to tend to foot care   In need of new motorized wheelchair, unable to walk due to hemiplegia from stroke. No recent falls with transfers. Requires significant assistance to transfer from bed to chair. Unable to use walker, manual wheelchair or scooter due to hemiplegia. Pt lives on single floor in home, Motorized wheelchair will allow him better access to home, including restroom, dining area and bedroom. Motorized wheelchair will help his assist with ADL's, feeding and tolieting.    Review of Systems   GEN- denies fatigue, fever, weight loss,weakness, recent illness HEENT- denies eye drainage, +change in vision, nasal discharge, CVS- denies chest pain, palpitations RESP- denies SOB, cough, wheeze ABD- denies N/V, change in stools, +abd pain GU- denies dysuria, hematuria, dribbling, incontinence MSK- + joint pain, muscle aches, injury Neuro- denies headache, dizziness, syncope, seizure activity      Objective:   Physical Exam GEN- NAD, alert and oriented x3, sitting in wheelchair, obese HEENT- EOMI, non injected sclera, pink conjunctiva, MMM, oropharynx clear, CVS- RRR, no murmur RESP-CTAB EXT-+ edema R >L Pulses- Radial, 2+ Neuro- right hemiplegia. Unable to stand without significant assistance. Motor 0/5 RUE, 1/5 RLE. Decreased tone in Right upper and lower ext , unable to ambulate.   Good grasp Left hand Skin-   Left lower back boil with mild drainage MSK- , left, decreased ROM, both TTP near [facility] joint , no swelling,  +empy can left side Feet- thickened nails bilat, callus formation, decreased sensation bilat       Assessment & Plan:      Pt is motivated to use power wheelchair Pt mentally able to operate power wheelchair  Due to hemiplegia can not use cane/waler, manual wheelchair or scooter   Motorized chair will provide some freedom for patient to assist with ADL's.

## 2013-04-07 NOTE — Assessment & Plan Note (Signed)
Lesion draining at side of previous removal, antibiotics, given,minimal cellulitic changes

## 2013-04-08 NOTE — Telephone Encounter (Signed)
Faxed to company and will await further paperwork

## 2013-04-09 ENCOUNTER — Telehealth: Payer: Self-pay | Admitting: Family Medicine

## 2013-04-09 ENCOUNTER — Other Ambulatory Visit: Payer: Self-pay | Admitting: Family Medicine

## 2013-04-09 ENCOUNTER — Encounter: Payer: Self-pay | Admitting: *Deleted

## 2013-04-09 NOTE — Telephone Encounter (Signed)
DEMOGRAPHICS SHEET SENT

## 2013-04-17 ENCOUNTER — Encounter: Payer: Self-pay | Admitting: Family Medicine

## 2013-04-17 NOTE — Assessment & Plan Note (Signed)
Motorized wheelchair evaluation per above

## 2013-04-17 NOTE — Addendum Note (Signed)
Addended by: Vic Blackbird F on: 04/17/2013 10:05 PM   Modules accepted: Orders, Level of Service

## 2013-05-09 ENCOUNTER — Other Ambulatory Visit: Payer: Self-pay

## 2013-05-09 MED ORDER — OXYCODONE-ACETAMINOPHEN 5-325 MG PO TABS: ORAL_TABLET | ORAL | Status: DC

## 2013-05-14 ENCOUNTER — Ambulatory Visit: Payer: Medicare Other

## 2013-05-14 ENCOUNTER — Encounter: Payer: Self-pay | Admitting: Orthopedic Surgery

## 2013-05-14 ENCOUNTER — Ambulatory Visit (INDEPENDENT_AMBULATORY_CARE_PROVIDER_SITE_OTHER): Payer: Medicare Other | Admitting: Orthopedic Surgery

## 2013-05-14 VITALS — BP 102/82 | Ht 72.0 in | Wt 240.0 lb

## 2013-05-14 DIAGNOSIS — M25519 Pain in unspecified shoulder: Secondary | ICD-10-CM

## 2013-05-14 DIAGNOSIS — M75102 Unspecified rotator cuff tear or rupture of left shoulder, not specified as traumatic: Secondary | ICD-10-CM

## 2013-05-14 DIAGNOSIS — M25512 Pain in left shoulder: Secondary | ICD-10-CM

## 2013-05-14 DIAGNOSIS — M7500 Adhesive capsulitis of unspecified shoulder: Secondary | ICD-10-CM

## 2013-05-14 DIAGNOSIS — M719 Bursopathy, unspecified: Secondary | ICD-10-CM

## 2013-05-14 DIAGNOSIS — M7502 Adhesive capsulitis of left shoulder: Secondary | ICD-10-CM

## 2013-05-14 NOTE — Progress Notes (Signed)
Patient ID: [name], male   DOB: 11-10-1946, 67 y.o.   MRN: [id number] Chief Complaint  Patient presents with  . Shoulder Pain    Stiffness in  left arm can not raise it up. Referred by Dr. Buelah Manis.    This is a 67 year old male who had a stroke with right hemiplegia and presents with pain and decreased range of motion in his left shoulder for month or more. His symptoms started gradually without any trauma. He does use his arm for significant amount of activities secondary to his right hemiplegia. He also uses the arm to help with mobility. His pain is sharp it 7/10 it's constant it's relieved somewhat with pain medication  He has no allergies  His review of systems positive findings include blurred vision constipation diarrhea heartburn and snoring he also has a complaint of frequency and excessive urination 14 systems were reviewed remaining systems were normal  The past, family history and social history have been reviewed and are recorded above  BP 102/82  Ht 6' (1.829 m)  Wt 240 lb (108.863 kg)  BMI 32.54 kg/m2  He is a very large man he is noted to have right upper extremity deformity. He is oriented to person and place questionable about time. His mood is flat his affect is flat as well. He does not ambulate well he transfers but with difficulty. He had to trouble transferring and we cannot do an x-ray  Is decreased external rotation of his left shoulder but the shoulder contour is normal there does not appear to be dislocation. He has normal muscle tone scans intact has a good pulse temperature of the arm is normal there is no lymphadenopathy in neck still and he has normal sensation in the left hand  I suspect he has adhesive capsulitis he also had decreased forward elevation and painful for elevation in the shoulder  Recommend subacromial injection and physical therapy and followup on a two-month basis until this resolves  Diagnosis adhesive capsulitis with rotator cuff  syndrome  Subacromial Shoulder Injection Procedure Note  Pre-operative Diagnosis: left RC Syndrome  Post-operative Diagnosis: same  Indications: pain   Anesthesia: ethyl chloride   Procedure Details   Verbal consent was obtained for the procedure. The shoulder was prepped withalcohol and the skin was anesthetized. A 20 gauge needle was advanced into the subacromial space through posterior approach without difficulty  The space was then injected with 3 ml 1% lidocaine and 1 ml of depomedrol. The injection site was cleansed with isopropyl alcohol and a dressing was applied.  Complications:  None; patient tolerated the procedure well.

## 2013-05-14 NOTE — Patient Instructions (Addendum)
Home Physical Therapy

## 2013-05-22 IMAGING — US US SOFT TISSUE HEAD/NECK
1 series · 13 of 19 positions shown · non-contrast
Comparison: None

CLINICAL DATA: Right neck mass, enlarging per patient; history
lymphoma

ULTRASOUND OF HEAD/NECK SOFT TISSUES
TECHNIQUE: Ultrasound examination of the head and neck soft
tissues was performed in the area of clinical concern.

[Series 1: us soft tissue head/neck · 0.06mm/px · 19 acquisitions, 13 frames shown]
[im 1/19]
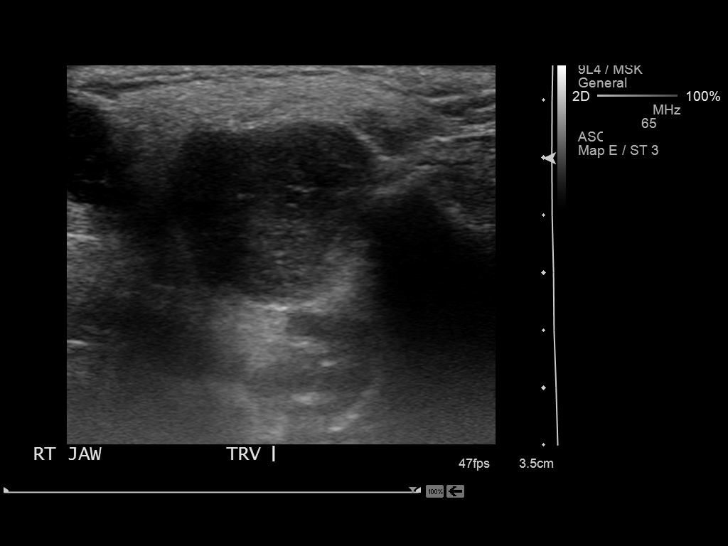
[im 3/19]
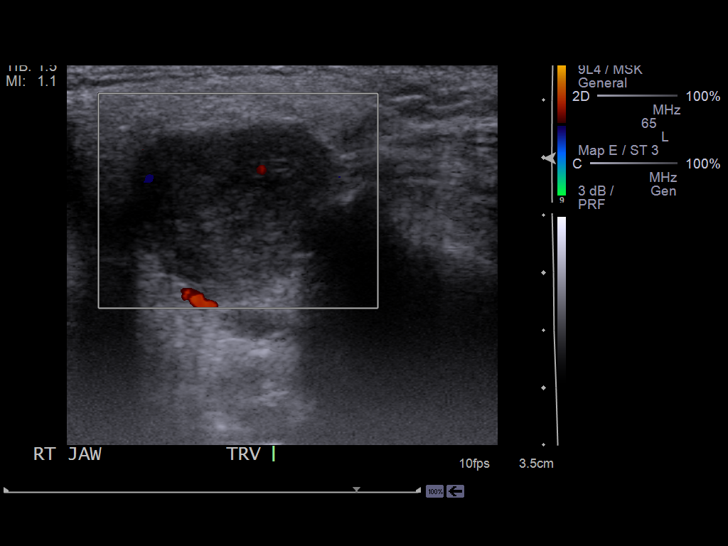
[im 4/19]
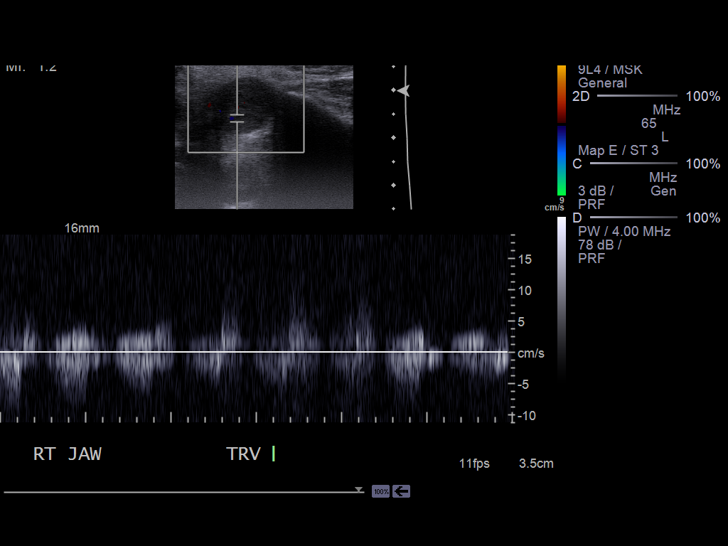
[im 6/19]
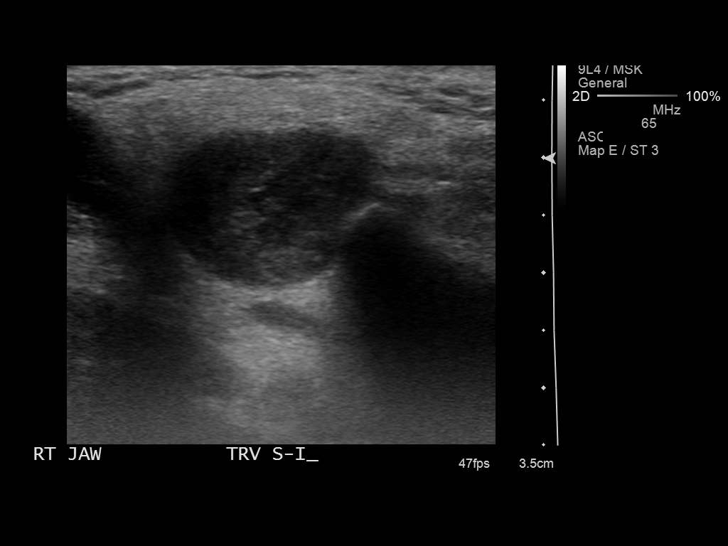
[im 7/19]
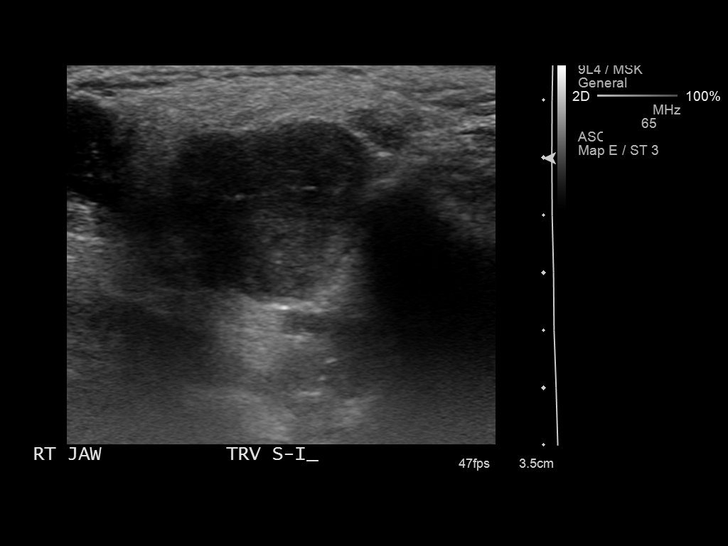
[im 9/19]
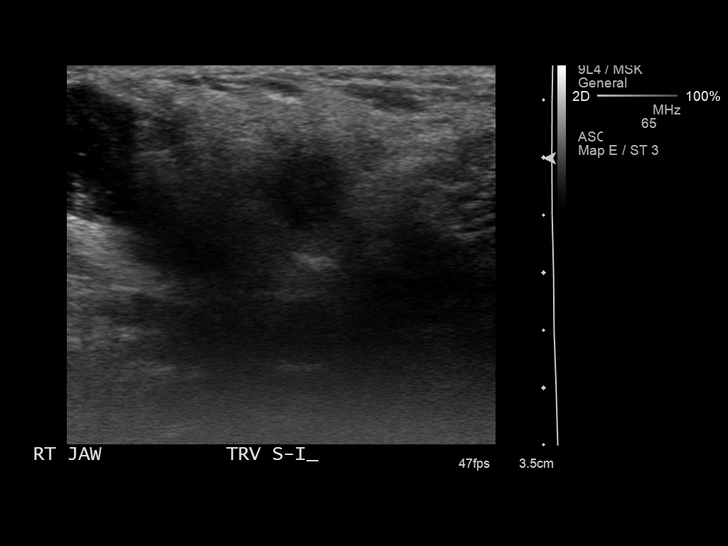
[im 10/19]
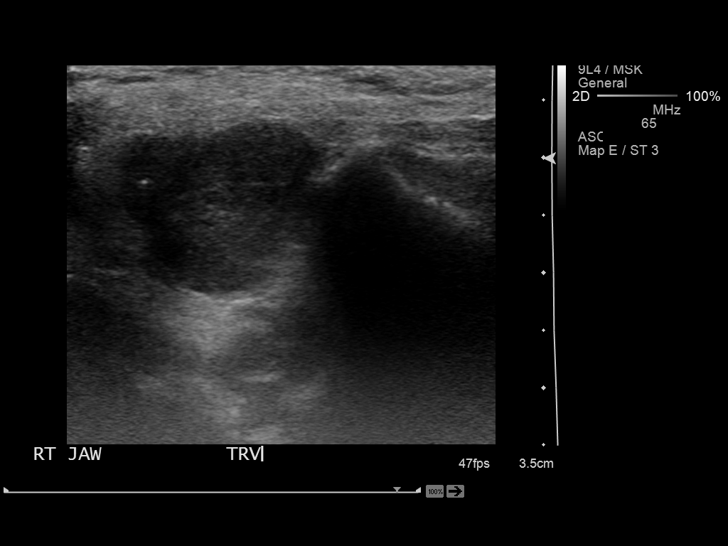
[im 11/19]
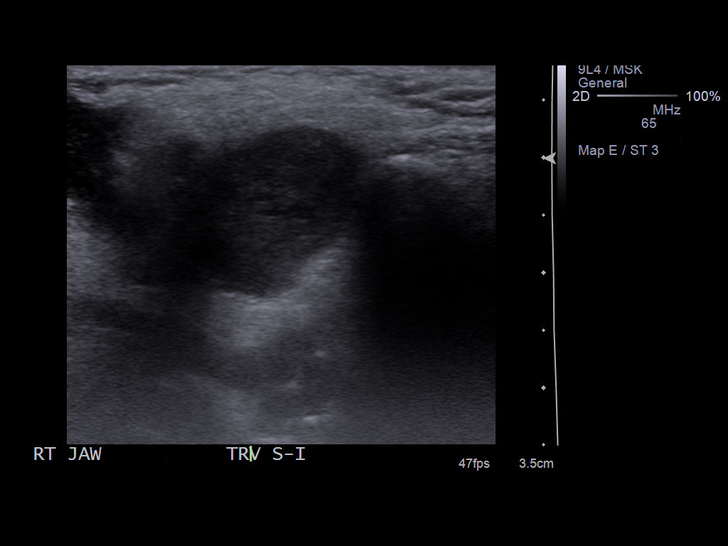
[im 13/19]
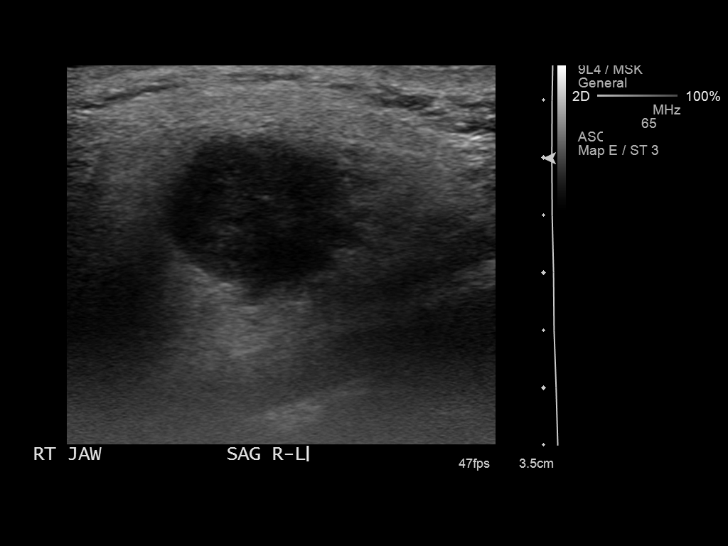
[im 14/19]
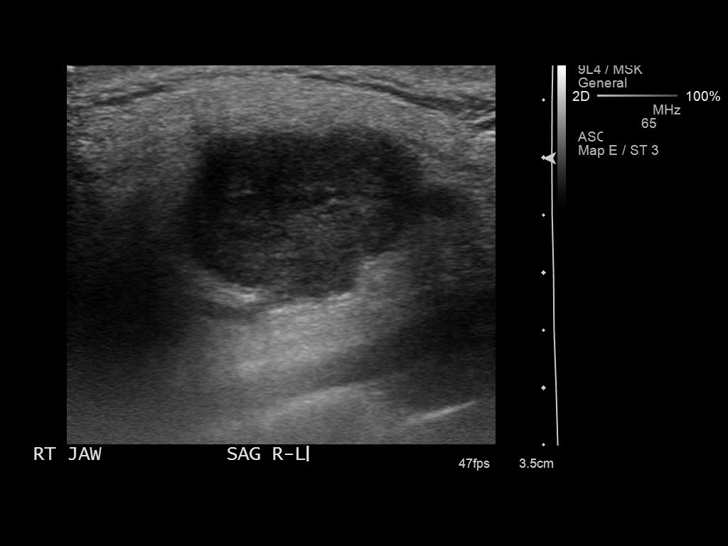
[im 16/19]
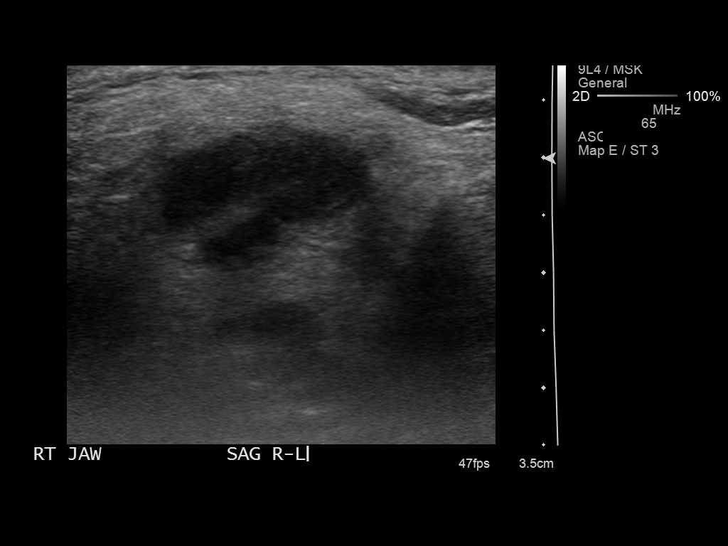
[im 17/19]
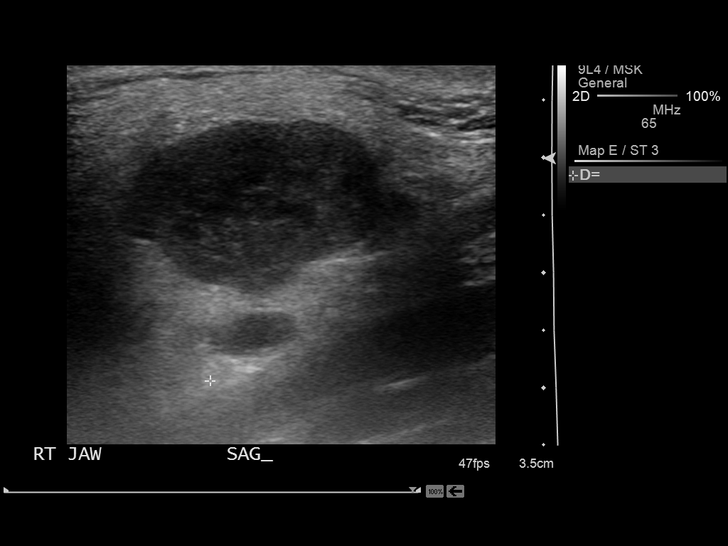
[im 19/19]
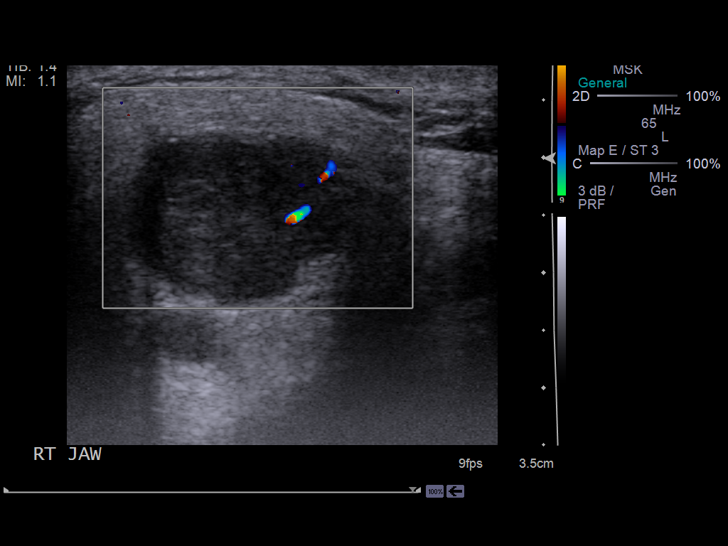

[13 of 19 positions shown; findings below may reference images not displayed]

FINDINGS: At the site clinical concern at the lateral right neck near the
angle of the mandible, a hypoechoic subcutaneous nodule is
identified which measures 2.3 x 1.6 x 1.8 cm.  Observe nodule is
well defined, macrolobulated, without definite calcification or
cyst.
Lesion is firm to palpation and questionably contains a single
small nonshadowing calcification.  Internal blood flow is present
within this nodule on color Doppler imaging. Nodule appears
external to adjacent muscle and bones.
IMPRESSION: Firm 2.3 x 1.6 x 1.8 cm diameter subcutaneous nodule in the
superior lateral right neck adjacent to the angle of the mandible,
macro lobulated and demonstrating internal blood flow on color
Doppler imaging.  This may represent a soft tissue mass or an
enlarged abnormal appearing lymph node lacking normal nodal
architecture. Sonographic appearance is indeterminate.  Lymphoma
recurrence is not excluded; tissue diagnosis recommended.

## 2013-05-23 ENCOUNTER — Other Ambulatory Visit: Payer: Self-pay | Admitting: Family Medicine

## 2013-05-23 ENCOUNTER — Telehealth: Payer: Self-pay | Admitting: Family Medicine

## 2013-05-23 MED ORDER — ACCU-CHEK AVIVA PLUS W/DEVICE KIT: 1.0000 | PACK | Freq: Once | Status: DC

## 2013-05-23 NOTE — Telephone Encounter (Signed)
Diabetic supply test kit ordered

## 2013-05-24 NOTE — Telephone Encounter (Signed)
Medication refilled per protocol. 

## 2013-05-30 ENCOUNTER — Ambulatory Visit (INDEPENDENT_AMBULATORY_CARE_PROVIDER_SITE_OTHER): Payer: Medicare Other | Admitting: Otolaryngology

## 2013-05-30 DIAGNOSIS — C07 Malignant neoplasm of parotid gland: Secondary | ICD-10-CM

## 2013-06-03 ENCOUNTER — Encounter: Payer: Self-pay | Admitting: Family Medicine

## 2013-06-03 ENCOUNTER — Telehealth: Payer: Self-pay | Admitting: Orthopedic Surgery

## 2013-06-03 NOTE — Telephone Encounter (Signed)
Spoke with Pieter Partridge and gave verbal order to extend therapy for one more week per Dr. Aline Brochure.

## 2013-06-03 NOTE — Telephone Encounter (Signed)
[facility] has been seeing Seward Carol for 2 weeks for his frozen shoulder.  Says he is improving but would like to continue his therapy for 1 more week, if you approve. [facility]'s # [phone]

## 2013-06-03 NOTE — Telephone Encounter (Signed)
Send order to extend

## 2013-06-05 ENCOUNTER — Other Ambulatory Visit: Payer: Self-pay | Admitting: Family Medicine

## 2013-06-05 NOTE — Telephone Encounter (Signed)
Med refilled.

## 2013-06-11 ENCOUNTER — Other Ambulatory Visit: Payer: Self-pay | Admitting: Family Medicine

## 2013-06-11 NOTE — Telephone Encounter (Signed)
Med refilled.

## 2013-06-19 NOTE — Progress Notes (Signed)
-  No show, letter sent-

## 2013-06-20 ENCOUNTER — Other Ambulatory Visit: Payer: Self-pay

## 2013-06-20 ENCOUNTER — Ambulatory Visit (HOSPITAL_COMMUNITY): Payer: Medicare Other | Admitting: Oncology

## 2013-06-29 IMAGING — CT CT NECK W/ CM
3 of 4 series · 13 of 33 positions shown, 16 images · IV contrast (Omnipaque 300)
Comparison: Soft tissue neck ultrasound 10/05/2012.

CLINICAL DATA: Neck mass.

CT NECK WITH CONTRAST
TECHNIQUE: Multidetector CT imaging of the neck was performed with
intravenous contrast.
Contrast: 75mL OMNIPAQUE IOHEXOL 300 MG/ML  SOLN

[Series 2: soft tissue neck 2.0 b31s · axial · 0.35mm/px · z∈[+663,+815]mm · 5 of 116 slices shown, 7 images]
[im 20/116  soft-tissue]
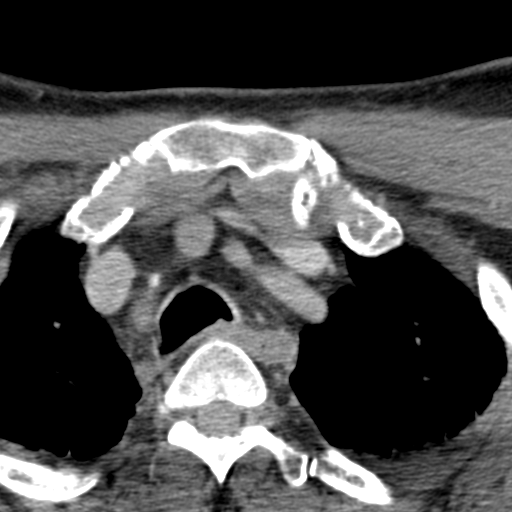
[im 20/116  bone]
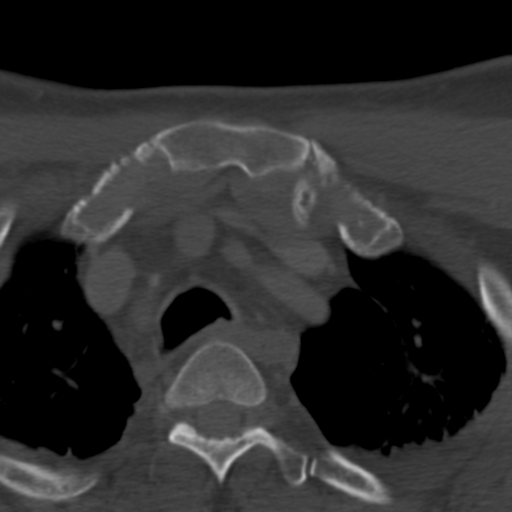
[im 39/116  bone]
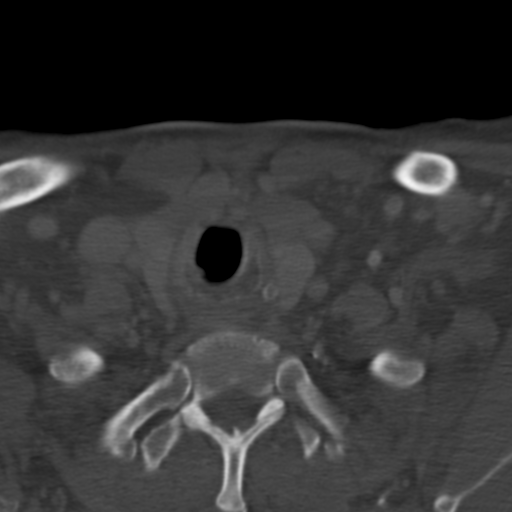
[im 58/116  bone]
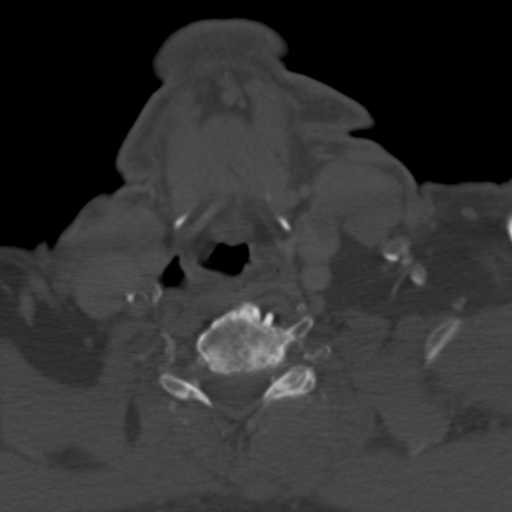
[im 77/116  bone]
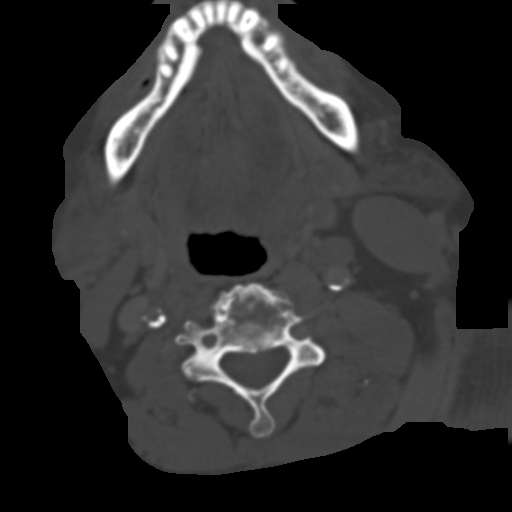
[im 96/116  soft-tissue]
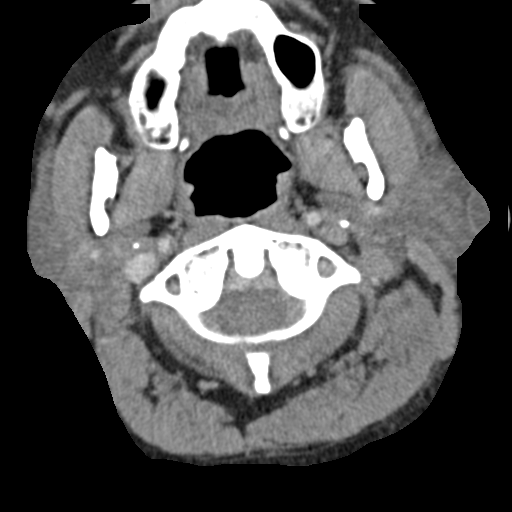
[im 96/116  bone]
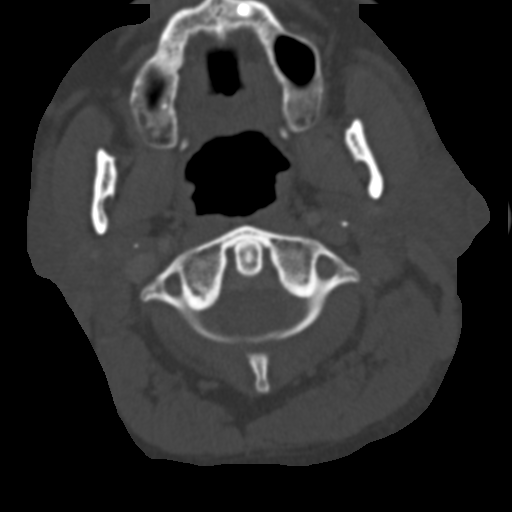

[Series 4: neck 2.0 soft tissue sag · sagittal · 0.34mm/px · 5 of 87 slices shown, 6 images]
[im 29/87  bone]
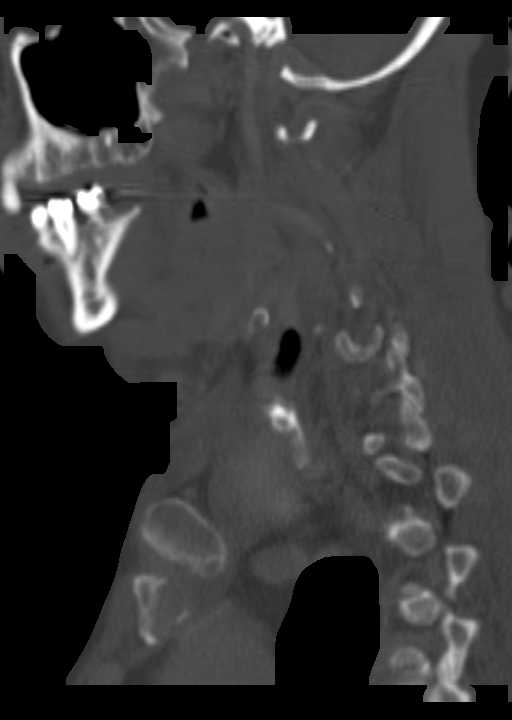
[im 36/87  bone]
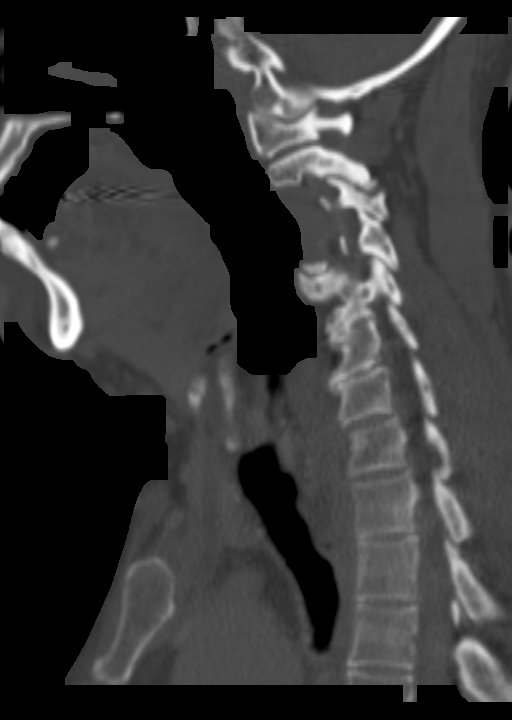
[im 44/87  soft-tissue]
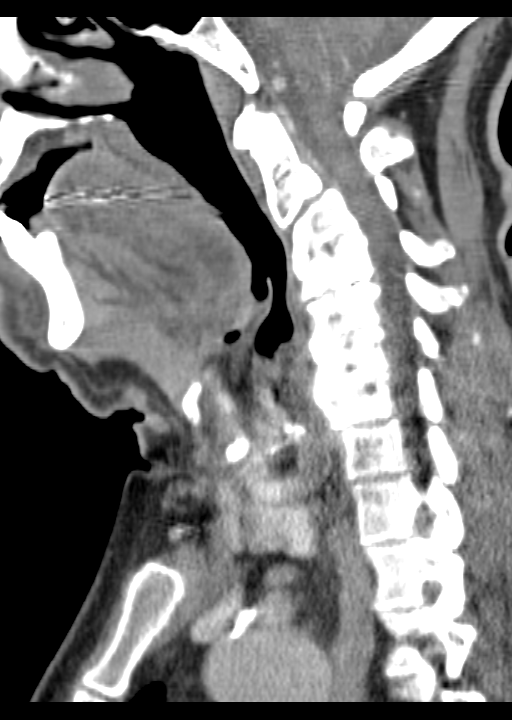
[im 44/87  bone]
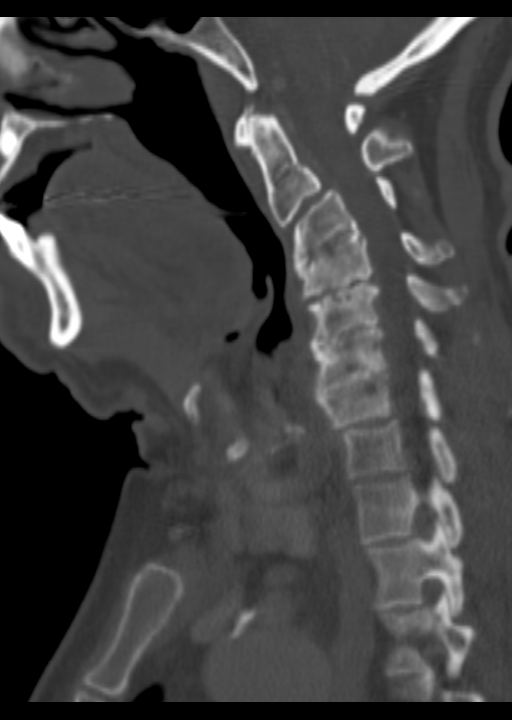
[im 51/87  bone]
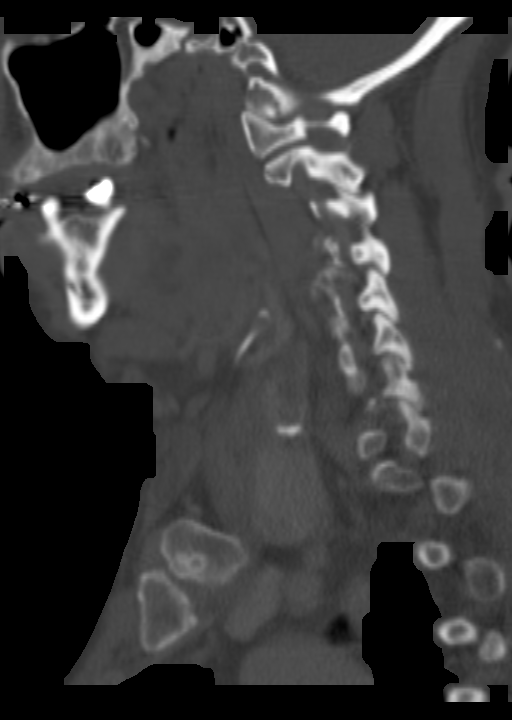
[im 58/87  bone]
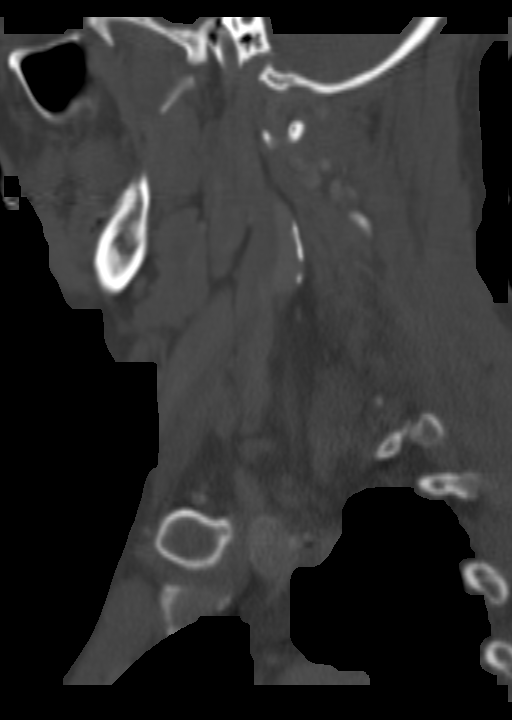

[Series 5: neck 2.0 soft tissue coro · coronal · 0.36mm/px · 3 of 87 slices shown]
[im 18/87  bone]
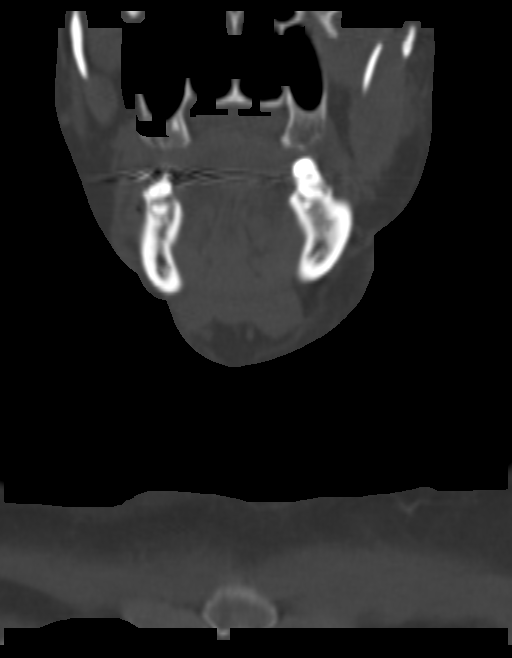
[im 35/87  bone]
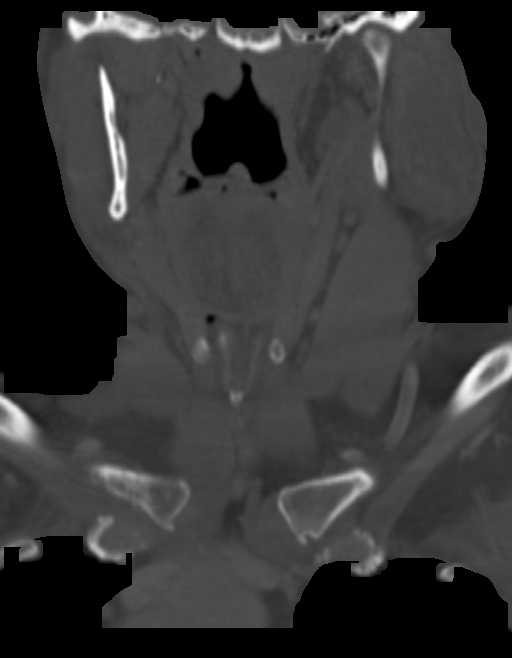
[im 52/87  bone]
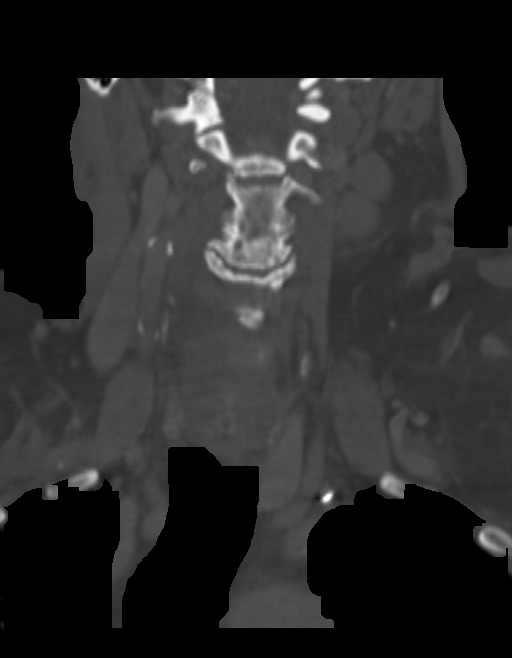

[13 of 33 positions shown; findings below may reference images not displayed]

FINDINGS: An ill-defined enhancing mass lesion is present at the
tail of the right parotid gland.  This lesion cannot be separated
from the gland.  It measures 2.3 x 3.1 x 2.9 cm.  The margins are
indistinct.

No significant adenopathy is present.

A subcutaneous nodule adjacent to the inferior left parotid is
homogeneous, measuring 13 x 17 x 24 mm.

No focal mucosal or submucosal lesions are present.  There is some
heterogeneity of the thyroid without a discrete mass.  The upper
mediastinum is unremarkable.

There is solid osseous fusion of the vertebral bodies from C3-C7.
Slight anterolisthesis is present at C7-T1 with osseous foraminal
narrowing on the left.  Anterolisthesis is present at C2-3.  No
focal lytic or blastic lesions are present.

Dense atherosclerotic calcifications are present at the carotid
bifurcations bilaterally.
IMPRESSION: 1.  Ill-defined 3.1 cm mass at the tail of the right parotid gland.
This is most concerning for primary parotid neoplasm.  Malignant
lesions are considered.
2.  No definite adenopathy.
3.  2.4 cm left subcutaneous lesion of the parotid is most
compatible with a sebaceous cyst.  This is well defined.
4.  Extensive ankylosis of the cervical spine.
5.  Atherosclerosis.

## 2013-07-02 ENCOUNTER — Ambulatory Visit (INDEPENDENT_AMBULATORY_CARE_PROVIDER_SITE_OTHER): Payer: Medicare Other | Admitting: Family Medicine

## 2013-07-02 ENCOUNTER — Encounter: Payer: Self-pay | Admitting: Family Medicine

## 2013-07-02 ENCOUNTER — Ambulatory Visit: Payer: Medicare Other | Admitting: Family Medicine

## 2013-07-02 VITALS — BP 100/70 | HR 68 | Temp 97.1°F | Resp 20

## 2013-07-02 DIAGNOSIS — M7502 Adhesive capsulitis of left shoulder: Secondary | ICD-10-CM

## 2013-07-02 DIAGNOSIS — E119 Type 2 diabetes mellitus without complications: Secondary | ICD-10-CM

## 2013-07-02 DIAGNOSIS — E1142 Type 2 diabetes mellitus with diabetic polyneuropathy: Secondary | ICD-10-CM

## 2013-07-02 DIAGNOSIS — E1149 Type 2 diabetes mellitus with other diabetic neurological complication: Secondary | ICD-10-CM

## 2013-07-02 DIAGNOSIS — E785 Hyperlipidemia, unspecified: Secondary | ICD-10-CM

## 2013-07-02 DIAGNOSIS — R609 Edema, unspecified: Secondary | ICD-10-CM

## 2013-07-02 DIAGNOSIS — M7500 Adhesive capsulitis of unspecified shoulder: Secondary | ICD-10-CM

## 2013-07-02 DIAGNOSIS — R600 Localized edema: Secondary | ICD-10-CM

## 2013-07-02 DIAGNOSIS — E1140 Type 2 diabetes mellitus with diabetic neuropathy, unspecified: Secondary | ICD-10-CM

## 2013-07-02 DIAGNOSIS — M542 Cervicalgia: Secondary | ICD-10-CM

## 2013-07-02 MED ORDER — GABAPENTIN 300 MG PO CAPS: 600.0000 mg | ORAL_CAPSULE | Freq: Three times a day (TID) | ORAL | Status: DC

## 2013-07-02 MED ORDER — OXYCODONE-ACETAMINOPHEN 5-325 MG PO TABS: ORAL_TABLET | ORAL | Status: DC

## 2013-07-02 MED ORDER — TIZANIDINE HCL 2 MG PO TABS: 2.0000 mg | ORAL_TABLET | Freq: Two times a day (BID) | ORAL | Status: DC | PRN

## 2013-07-02 NOTE — Patient Instructions (Addendum)
We will send an order for stockings and diabetic shoes to [facility]  Continue current medications Start muscle relaxer twice a day  Continue pain medications F/U with ortho and foot doctor Gabapentin change to 640m three times a day F/U 3 months We will call about the Wheelchair

## 2013-07-03 NOTE — Assessment & Plan Note (Signed)
Bilat  Venous stasis and pooling Given new script for compression hose

## 2013-07-03 NOTE — Assessment & Plan Note (Signed)
Increase gabapentin to 614m TID

## 2013-07-03 NOTE — Assessment & Plan Note (Signed)
Followed by endocrine New script for diabetic shoes

## 2013-07-03 NOTE — Assessment & Plan Note (Signed)
Secondary to ankylosis and OA, with his stroke poor posture, muscle stiffness and spasm Trial of zanaflex

## 2013-07-03 NOTE — Assessment & Plan Note (Signed)
F/u with ortho, continue percocet He has improved some with PT compared to initial visit

## 2013-07-03 NOTE — Assessment & Plan Note (Signed)
LDL at goal, done in Feb 2014

## 2013-07-03 NOTE — Progress Notes (Signed)
  Subjective:    Patient ID: [name], male    DOB: 08/19/1946, 67 y.o.   MRN: [id number]  HPI  Pt here to f/u chronic medical problems. Left shoulder pain- frozen shoulder syndrome diagnosed, given cortisone shot and placed in PT, minimal relief in pain DM- followed by endocrine, last A1C 7.6%, needs new diabetic shoes  Neuropathy- increased tingling and numbness in bilat feet, neurontin helps some, seems to wear off Has not received motorized wheelchair  Neck pain- persistent neck pain, comes from back of neck, causes headaches some times. Wife notes he is very stiff. Pain meds help   Review of Systems  GEN- denies fatigue, fever, weight loss,weakness, recent illness HEENT- denies eye drainage, change in vision, nasal discharge, CVS- denies chest pain, palpitations RESP- denies SOB, cough, wheeze ABD- denies N/V, change in stools, abd pain GU- denies dysuria, hematuria, dribbling, incontinence MSK- + joint pain, muscle aches, injury Neuro- denies headache, dizziness, syncope, seizure activity      Objective:   Physical Exam GEN- NAD, alert and oriented x3, sitting in wheelchair, obese HEENT- EOMI, non injected sclera, pink conjunctiva, MMM, oropharynx clear, Neck- decreased ROM, +spasm of trapezius CVS- RRR, no murmur RESP-CTAB EXT-+ edema R >L Pulses- Radial, 2+, DP diminished bilat MSK- Decreased ROM Left shoulder, pain with elevation Neuro- right hemiplegia. Unable to stand without significant assistance. Motor 0/5 RUE, 1/5 RLE. Decreased tone in Right upper and lower ext , unable to ambulate.   Good grasp Left hand Skin- in tact , see diabetic foot exam       Assessment & Plan:

## 2013-07-09 ENCOUNTER — Telehealth: Payer: Self-pay | Admitting: Family Medicine

## 2013-07-09 NOTE — Telephone Encounter (Signed)
Did they arrange PT ,[place] I did not refer him for any physical therapy regarding his wheelchair

## 2013-07-09 NOTE — Telephone Encounter (Signed)
Called wheelchair professional on this pt information was received.Jerene Pitch from [employer] is waiting on documentation from PT and once that is obtained information will be sent over to [place].

## 2013-07-09 NOTE — Telephone Encounter (Signed)
Left message for pt to return my call.

## 2013-07-10 NOTE — Telephone Encounter (Signed)
Pt wife called back and pt is goig to PT from Dr. Aline Brochure d/t his frozen shoulder. Pt goes back to Dr. Aline Brochure next tues, I told her that if Wheelchair professionals should be in contact with her as soon as they get documentation from PT if she doesn't hear from them before she goes she is to call me and I will touch base with the W/C professionals

## 2013-07-10 NOTE — Telephone Encounter (Signed)
Call the wheelchair professionals back again, I did not set up any PT for any wheelchair evaluation. His PT for frozen shoulder was prescribed by orthopedic specialist Dr. Aline Brochure. This should not be hindering him from getting his chair. I need better clarification on what the hold up is, get a direct number to who-ever is working on the case if you can not get this clarified and I will call

## 2013-07-12 NOTE — Telephone Encounter (Signed)
Waiting on Brooke fro [employer] to return my call

## 2013-07-16 ENCOUNTER — Ambulatory Visit (INDEPENDENT_AMBULATORY_CARE_PROVIDER_SITE_OTHER): Payer: Medicare Other | Admitting: Orthopedic Surgery

## 2013-07-16 ENCOUNTER — Encounter: Payer: Self-pay | Admitting: Orthopedic Surgery

## 2013-07-16 VITALS — BP 110/82 | Ht 72.0 in | Wt 240.0 lb

## 2013-07-16 DIAGNOSIS — M7502 Adhesive capsulitis of left shoulder: Secondary | ICD-10-CM

## 2013-07-16 DIAGNOSIS — M7500 Adhesive capsulitis of unspecified shoulder: Secondary | ICD-10-CM

## 2013-07-16 NOTE — Patient Instructions (Signed)
Continue therapy

## 2013-07-16 NOTE — Progress Notes (Signed)
Patient ID: [name], male   DOB: [birth year], 67 y.o.   MRN: [id number] Chief Complaint  Patient presents with  . Follow-up    2 month recheck left shoulder Adhesive Capsulitis    BP 110/82  Ht 6' (1.829 m)  Wt 240 lb (108.863 kg)  BMI 32.54 kg/m2   Recheck status post physical therapy adhesive capsulitis Patient says that he is only allowed for therapy visits and then the therapy stopped  [facility]. Not sure why his therapy was stopped he has not improved in any significant manner he still has 20 of external rotation of his shoulder with his arm at his side has painful for elevation to 70 with a stiff shoulder  I reordered this therapy and I'll talk was [facility] to see why they stopped treating him  Followup in 2 months

## 2013-07-17 ENCOUNTER — Telehealth: Payer: Self-pay | Admitting: Family Medicine

## 2013-07-17 NOTE — Telephone Encounter (Signed)
Please follow-up on this and let me know

## 2013-07-18 NOTE — Telephone Encounter (Signed)
Called the wheelchair professionals to check statues of wheelchair waiting on someone to call me back.

## 2013-07-22 ENCOUNTER — Telehealth: Payer: Self-pay | Admitting: Orthopedic Surgery

## 2013-07-22 ENCOUNTER — Encounter (HOSPITAL_COMMUNITY): Payer: Self-pay | Admitting: Oncology

## 2013-07-22 NOTE — Telephone Encounter (Signed)
Reprinted  [facility]ax to [facility]

## 2013-07-22 NOTE — Telephone Encounter (Signed)
[facility] needs an order for in-home OT for [facility].  Stacey's phone # [phone]

## 2013-07-23 ENCOUNTER — Other Ambulatory Visit: Payer: Self-pay | Admitting: *Deleted

## 2013-07-23 DIAGNOSIS — M7502 Adhesive capsulitis of left shoulder: Secondary | ICD-10-CM

## 2013-07-23 NOTE — Telephone Encounter (Signed)
FAXED TO [phone]

## 2013-07-24 ENCOUNTER — Inpatient Hospital Stay (HOSPITAL_COMMUNITY): Payer: Medicare Other

## 2013-07-24 ENCOUNTER — Emergency Department (HOSPITAL_COMMUNITY): Payer: Medicare Other

## 2013-07-24 ENCOUNTER — Inpatient Hospital Stay (HOSPITAL_COMMUNITY)
Admission: EM | Admit: 2013-07-24 | Discharge: 2013-07-31 | DRG: 871 | Disposition: A | Discharge status: Home Health | Payer: Medicare Other | Attending: Family Medicine | Admitting: Family Medicine

## 2013-07-24 ENCOUNTER — Encounter (HOSPITAL_COMMUNITY): Payer: Self-pay | Admitting: Emergency Medicine

## 2013-07-24 DIAGNOSIS — Z6832 Body mass index (BMI) 32.0-32.9, adult: Secondary | ICD-10-CM

## 2013-07-24 DIAGNOSIS — L0293 Carbuncle, unspecified: Secondary | ICD-10-CM | POA: Diagnosis present

## 2013-07-24 DIAGNOSIS — R000 Tachycardia, unspecified: Secondary | ICD-10-CM | POA: Diagnosis present

## 2013-07-24 DIAGNOSIS — A419 Sepsis, unspecified organism: Secondary | ICD-10-CM

## 2013-07-24 DIAGNOSIS — N179 Acute kidney failure, unspecified: Secondary | ICD-10-CM

## 2013-07-24 DIAGNOSIS — R6520 Severe sepsis without septic shock: Secondary | ICD-10-CM | POA: Diagnosis present

## 2013-07-24 DIAGNOSIS — R7881 Bacteremia: Secondary | ICD-10-CM

## 2013-07-24 DIAGNOSIS — C07 Malignant neoplasm of parotid gland: Secondary | ICD-10-CM | POA: Diagnosis present

## 2013-07-24 DIAGNOSIS — E1149 Type 2 diabetes mellitus with other diabetic neurological complication: Secondary | ICD-10-CM | POA: Diagnosis present

## 2013-07-24 DIAGNOSIS — J4489 Other specified chronic obstructive pulmonary disease: Secondary | ICD-10-CM | POA: Diagnosis present

## 2013-07-24 DIAGNOSIS — E785 Hyperlipidemia, unspecified: Secondary | ICD-10-CM | POA: Diagnosis present

## 2013-07-24 DIAGNOSIS — E669 Obesity, unspecified: Secondary | ICD-10-CM | POA: Diagnosis present

## 2013-07-24 DIAGNOSIS — R509 Fever, unspecified: Secondary | ICD-10-CM

## 2013-07-24 DIAGNOSIS — L0292 Furuncle, unspecified: Secondary | ICD-10-CM | POA: Diagnosis present

## 2013-07-24 DIAGNOSIS — N183 Chronic kidney disease, stage 3 unspecified: Secondary | ICD-10-CM | POA: Diagnosis present

## 2013-07-24 DIAGNOSIS — J9600 Acute respiratory failure, unspecified whether with hypoxia or hypercapnia: Secondary | ICD-10-CM | POA: Diagnosis not present

## 2013-07-24 DIAGNOSIS — J449 Chronic obstructive pulmonary disease, unspecified: Secondary | ICD-10-CM | POA: Diagnosis present

## 2013-07-24 DIAGNOSIS — K7689 Other specified diseases of liver: Secondary | ICD-10-CM | POA: Diagnosis present

## 2013-07-24 DIAGNOSIS — E1140 Type 2 diabetes mellitus with diabetic neuropathy, unspecified: Secondary | ICD-10-CM | POA: Diagnosis present

## 2013-07-24 DIAGNOSIS — Z923 Personal history of irradiation: Secondary | ICD-10-CM

## 2013-07-24 DIAGNOSIS — A4151 Sepsis due to Escherichia coli [E. coli]: Principal | ICD-10-CM | POA: Diagnosis present

## 2013-07-24 DIAGNOSIS — Z993 Dependence on wheelchair: Secondary | ICD-10-CM

## 2013-07-24 DIAGNOSIS — K560 Paralytic ileus: Secondary | ICD-10-CM | POA: Diagnosis not present

## 2013-07-24 DIAGNOSIS — Z87891 Personal history of nicotine dependence: Secondary | ICD-10-CM

## 2013-07-24 DIAGNOSIS — E1122 Type 2 diabetes mellitus with diabetic chronic kidney disease: Secondary | ICD-10-CM | POA: Diagnosis present

## 2013-07-24 DIAGNOSIS — R1011 Right upper quadrant pain: Secondary | ICD-10-CM

## 2013-07-24 DIAGNOSIS — I129 Hypertensive chronic kidney disease with stage 1 through stage 4 chronic kidney disease, or unspecified chronic kidney disease: Secondary | ICD-10-CM | POA: Diagnosis present

## 2013-07-24 DIAGNOSIS — N400 Enlarged prostate without lower urinary tract symptoms: Secondary | ICD-10-CM | POA: Diagnosis present

## 2013-07-24 DIAGNOSIS — E119 Type 2 diabetes mellitus without complications: Secondary | ICD-10-CM

## 2013-07-24 DIAGNOSIS — E860 Dehydration: Secondary | ICD-10-CM

## 2013-07-24 DIAGNOSIS — E1142 Type 2 diabetes mellitus with diabetic polyneuropathy: Secondary | ICD-10-CM | POA: Diagnosis present

## 2013-07-24 DIAGNOSIS — J189 Pneumonia, unspecified organism: Secondary | ICD-10-CM

## 2013-07-24 DIAGNOSIS — K219 Gastro-esophageal reflux disease without esophagitis: Secondary | ICD-10-CM | POA: Diagnosis present

## 2013-07-24 DIAGNOSIS — I69959 Hemiplegia and hemiparesis following unspecified cerebrovascular disease affecting unspecified side: Secondary | ICD-10-CM

## 2013-07-24 DIAGNOSIS — D72829 Elevated white blood cell count, unspecified: Secondary | ICD-10-CM

## 2013-07-24 DIAGNOSIS — M109 Gout, unspecified: Secondary | ICD-10-CM | POA: Diagnosis present

## 2013-07-24 DIAGNOSIS — N390 Urinary tract infection, site not specified: Secondary | ICD-10-CM

## 2013-07-24 DIAGNOSIS — N189 Chronic kidney disease, unspecified: Secondary | ICD-10-CM

## 2013-07-24 DIAGNOSIS — I10 Essential (primary) hypertension: Secondary | ICD-10-CM

## 2013-07-24 DIAGNOSIS — N289 Disorder of kidney and ureter, unspecified: Secondary | ICD-10-CM

## 2013-07-24 DIAGNOSIS — M129 Arthropathy, unspecified: Secondary | ICD-10-CM | POA: Diagnosis present

## 2013-07-24 DIAGNOSIS — Z8249 Family history of ischemic heart disease and other diseases of the circulatory system: Secondary | ICD-10-CM

## 2013-07-24 DIAGNOSIS — K759 Inflammatory liver disease, unspecified: Secondary | ICD-10-CM | POA: Diagnosis present

## 2013-07-24 DIAGNOSIS — Z66 Do not resuscitate: Secondary | ICD-10-CM | POA: Diagnosis present

## 2013-07-24 DIAGNOSIS — I959 Hypotension, unspecified: Secondary | ICD-10-CM | POA: Diagnosis present

## 2013-07-24 HISTORY — DX: Other chronic pain: G89.29

## 2013-07-24 HISTORY — DX: Unspecified abdominal pain: R10.9

## 2013-07-24 LAB — HEMOGLOBIN A1C
Hgb A1c MFr Bld: 7.2 % — ABNORMAL HIGH (ref <5.7)
Mean Plasma Glucose: 160 mg/dL — ABNORMAL HIGH (ref <117)

## 2013-07-24 LAB — CBC WITH DIFFERENTIAL/PLATELET
Lymphocytes Relative: 4 % — ABNORMAL LOW (ref 12–46)
Lymphs Abs: 0.6 10*3/uL — ABNORMAL LOW (ref 0.7–4.0)
Neutrophils Relative %: 91 % — ABNORMAL HIGH (ref 43–77)
Platelets: 181 10*3/uL (ref 150–400)
RBC: 6.08 MIL/uL — ABNORMAL HIGH (ref 4.22–5.81)
WBC Morphology: INCREASED
WBC: 14.5 10*3/uL — ABNORMAL HIGH (ref 4.0–10.5)

## 2013-07-24 LAB — URINALYSIS W MICROSCOPIC + REFLEX CULTURE
Bilirubin Urine: NEGATIVE
Nitrite: NEGATIVE
Specific Gravity, Urine: >1.03 — ABNORMAL HIGH (ref 1.005–1.030)
Urobilinogen, UA: 1 mg/dL (ref 0.0–1.0)

## 2013-07-24 LAB — HEPATIC FUNCTION PANEL
Alkaline Phosphatase: 85 U/L (ref 39–117)
Bilirubin, Direct: 0.2 mg/dL (ref 0.0–0.3)
Indirect Bilirubin: 0.3 mg/dL (ref 0.3–0.9)
Total Bilirubin: 0.5 mg/dL (ref 0.3–1.2)

## 2013-07-24 LAB — BASIC METABOLIC PANEL
CO2: 19 mEq/L (ref 19–32)
Chloride: 99 mEq/L (ref 96–112)
Potassium: 4.3 mEq/L (ref 3.5–5.1)
Sodium: 137 mEq/L (ref 135–145)

## 2013-07-24 LAB — GLUCOSE, CAPILLARY
Glucose-Capillary: 158 mg/dL — ABNORMAL HIGH (ref 70–99)
Glucose-Capillary: 249 mg/dL — ABNORMAL HIGH (ref 70–99)

## 2013-07-24 LAB — PRO B NATRIURETIC PEPTIDE: Pro B Natriuretic peptide (BNP): 814.2 pg/mL — ABNORMAL HIGH (ref 0–125)

## 2013-07-24 LAB — TROPONIN I: Troponin I: <0.3 ng/mL (ref <0.30)

## 2013-07-24 MED ORDER — PANTOPRAZOLE SODIUM 40 MG PO TBEC
40.0000 mg | DELAYED_RELEASE_TABLET | Freq: Every day | ORAL | Status: DC
  Administered 2013-07-24: 40 mg via ORAL
  Filled 2013-07-24 (×2): qty 1

## 2013-07-24 MED ORDER — IPRATROPIUM BROMIDE 0.02 % IN SOLN
0.5000 mg | Freq: Two times a day (BID) | RESPIRATORY_TRACT | Status: DC
  Administered 2013-07-24 – 2013-07-31 (×14): 0.5 mg via RESPIRATORY_TRACT
  Filled 2013-07-24 (×15): qty 2.5

## 2013-07-24 MED ORDER — INSULIN ASPART 100 UNIT/ML ~~LOC~~ SOLN
0.0000 [IU] | Freq: Every day | SUBCUTANEOUS | Status: DC
  Administered 2013-07-24 – 2013-07-26 (×3): 2 [IU] via SUBCUTANEOUS
  Administered 2013-07-28 – 2013-07-29 (×2): 3 [IU] via SUBCUTANEOUS

## 2013-07-24 MED ORDER — ONDANSETRON HCL 4 MG/2ML IJ SOLN
4.0000 mg | Freq: Four times a day (QID) | INTRAMUSCULAR | Status: DC | PRN
  Administered 2013-07-24 – 2013-07-25 (×2): 4 mg via INTRAVENOUS
  Filled 2013-07-24 (×2): qty 2

## 2013-07-24 MED ORDER — MORPHINE SULFATE 2 MG/ML IJ SOLN
1.0000 mg | INTRAMUSCULAR | Status: DC | PRN
  Administered 2013-07-25 (×2): 1 mg via INTRAVENOUS
  Filled 2013-07-24 (×2): qty 1

## 2013-07-24 MED ORDER — HEPARIN SODIUM (PORCINE) 5000 UNIT/ML IJ SOLN
5000.0000 [IU] | Freq: Three times a day (TID) | INTRAMUSCULAR | Status: DC
  Administered 2013-07-24 – 2013-07-31 (×20): 5000 [IU] via SUBCUTANEOUS
  Filled 2013-07-24 (×13): qty 1
  Filled 2013-07-24: qty 2
  Filled 2013-07-24 (×4): qty 1

## 2013-07-24 MED ORDER — VANCOMYCIN HCL 10 G IV SOLR
1500.0000 mg | INTRAVENOUS | Status: DC
  Filled 2013-07-24: qty 1500

## 2013-07-24 MED ORDER — ACETAMINOPHEN 325 MG PO TABS
650.0000 mg | ORAL_TABLET | Freq: Four times a day (QID) | ORAL | Status: DC | PRN
  Administered 2013-07-25: 650 mg via ORAL
  Filled 2013-07-24: qty 2

## 2013-07-24 MED ORDER — ONDANSETRON HCL 4 MG PO TABS: 4.0000 mg | ORAL_TABLET | Freq: Four times a day (QID) | ORAL | Status: DC | PRN

## 2013-07-24 MED ORDER — ALUM & MAG HYDROXIDE-SIMETH 200-200-20 MG/5ML PO SUSP
30.0000 mL | Freq: Four times a day (QID) | ORAL | Status: DC | PRN
  Administered 2013-07-27: 30 mL via ORAL
  Filled 2013-07-24: qty 30

## 2013-07-24 MED ORDER — ALBUTEROL SULFATE (5 MG/ML) 0.5% IN NEBU
2.5000 mg | INHALATION_SOLUTION | Freq: Four times a day (QID) | RESPIRATORY_TRACT | Status: DC
  Administered 2013-07-24: 2.5 mg via RESPIRATORY_TRACT
  Filled 2013-07-24: qty 0.5

## 2013-07-24 MED ORDER — INSULIN GLARGINE 100 UNIT/ML ~~LOC~~ SOLN
20.0000 [IU] | Freq: Every day | SUBCUTANEOUS | Status: DC
  Administered 2013-07-24: 20 [IU] via SUBCUTANEOUS
  Filled 2013-07-24 (×2): qty 0.2

## 2013-07-24 MED ORDER — IPRATROPIUM BROMIDE 0.02 % IN SOLN
0.5000 mg | Freq: Four times a day (QID) | RESPIRATORY_TRACT | Status: DC
  Administered 2013-07-24: 0.5 mg via RESPIRATORY_TRACT

## 2013-07-24 MED ORDER — ACETAMINOPHEN 650 MG RE SUPP: 650.0000 mg | Freq: Four times a day (QID) | RECTAL | Status: DC | PRN

## 2013-07-24 MED ORDER — ALBUTEROL SULFATE (5 MG/ML) 0.5% IN NEBU
2.5000 mg | INHALATION_SOLUTION | Freq: Two times a day (BID) | RESPIRATORY_TRACT | Status: DC
  Administered 2013-07-24 – 2013-07-31 (×14): 2.5 mg via RESPIRATORY_TRACT
  Filled 2013-07-24 (×15): qty 0.5

## 2013-07-24 MED ORDER — DEXTROSE 5 % IV SOLN
1.0000 g | INTRAVENOUS | Status: DC
  Administered 2013-07-25 – 2013-07-29 (×5): 1 g via INTRAVENOUS
  Filled 2013-07-24 (×5): qty 10

## 2013-07-24 MED ORDER — INSULIN ASPART 100 UNIT/ML ~~LOC~~ SOLN
0.0000 [IU] | Freq: Three times a day (TID) | SUBCUTANEOUS | Status: DC
  Administered 2013-07-24: 3 [IU] via SUBCUTANEOUS
  Administered 2013-07-24: 8 [IU] via SUBCUTANEOUS
  Administered 2013-07-25 (×2): 3 [IU] via SUBCUTANEOUS
  Administered 2013-07-25: 5 [IU] via SUBCUTANEOUS
  Administered 2013-07-26: 8 [IU] via SUBCUTANEOUS
  Administered 2013-07-26 – 2013-07-27 (×3): 5 [IU] via SUBCUTANEOUS
  Administered 2013-07-27: 8 [IU] via SUBCUTANEOUS
  Administered 2013-07-27: 5 [IU] via SUBCUTANEOUS
  Administered 2013-07-28: 3 [IU] via SUBCUTANEOUS
  Administered 2013-07-28: 5 [IU] via SUBCUTANEOUS
  Administered 2013-07-28: 8 [IU] via SUBCUTANEOUS
  Administered 2013-07-29 (×3): 5 [IU] via SUBCUTANEOUS
  Administered 2013-07-30: 3 [IU] via SUBCUTANEOUS
  Administered 2013-07-30: 2 [IU] via SUBCUTANEOUS
  Administered 2013-07-30: 5 [IU] via SUBCUTANEOUS
  Administered 2013-07-31: 3 [IU] via SUBCUTANEOUS
  Administered 2013-07-31: 5 [IU] via SUBCUTANEOUS

## 2013-07-24 MED ORDER — SODIUM CHLORIDE 0.9 % IV SOLN: INTRAVENOUS | Status: DC

## 2013-07-24 MED ORDER — SODIUM CHLORIDE 0.9 % IV SOLN
INTRAVENOUS | Status: DC
  Administered 2013-07-24: 21:00:00 via INTRAVENOUS

## 2013-07-24 MED ORDER — OXYCODONE-ACETAMINOPHEN 5-325 MG PO TABS
1.0000 | ORAL_TABLET | ORAL | Status: DC | PRN
  Administered 2013-07-24: 1 via ORAL
  Filled 2013-07-24: qty 1

## 2013-07-24 MED ORDER — SODIUM CHLORIDE 0.9 % IV BOLUS (SEPSIS)
500.0000 mL | Freq: Once | INTRAVENOUS | Status: AC
  Administered 2013-07-24: 500 mL via INTRAVENOUS

## 2013-07-24 MED ORDER — VANCOMYCIN HCL 10 G IV SOLR
2500.0000 mg | Freq: Once | INTRAVENOUS | Status: DC
  Filled 2013-07-24: qty 2500

## 2013-07-24 MED ORDER — DEXTROSE 5 % IV SOLN
1.0000 g | Freq: Once | INTRAVENOUS | Status: AC
  Administered 2013-07-24: 1 g via INTRAVENOUS
  Filled 2013-07-24: qty 10

## 2013-07-24 MED ORDER — ACETAMINOPHEN 500 MG PO TABS
1000.0000 mg | ORAL_TABLET | Freq: Once | ORAL | Status: AC
  Administered 2013-07-24: 1000 mg via ORAL
  Filled 2013-07-24: qty 2

## 2013-07-24 NOTE — H&P (Signed)
[facility] History and Physical  [name] CWC:[id number] DOB: June 29, 1946 DOA: 07/24/2013  Referring physician:  PCP: Vic Blackbird, MD  Specialists:   Chief Complaint:   HPI: [name] is a very pleasant 67 y.o. male with a past medical history of CVA 15 years ago with right-sided hemiparesis, lymphoma, chronic renal failure, BPH, diabetes, gout presents to the emergency department with the chief complaint of persistent chills and intermittent cough. Information is obtained from the patient and the wife who is at the bedside. He reports that he was in his usual state of health until yesterday evening when he developed intermittent chills. The wife reports that he went to bed last night he complained of being very cold and then during the night he vacillated between chills and being too hot. When he awakened this morning he had sudden onset of shortness of breath with audible wheezing. Patient reports he did have asthma as a young child but has not had an asthma attack in decades. He states the more short of breath he became the more anxious he became which seemed to make her breathing worse. He does report intermittent coughing during the night nonproductive in nature. He denies chest pain palpitations headache visual disturbances. He denies nausea vomiting constipation diarrhea melena. He denies dysuria hematuria frequency or urgency. Wife does report that this morning when patient urinated that his urine was very dark in color but she denied any odor. He does indicate some diffuse abdominal pain that is not new. Wife reports he has been 2 gastroenterologist and primary care doctor on several occasions with no results in terms of identifying etiology for this pain. Wife reports yesterday patient's glucose level was running a little on the high side. She also reports that in his past he has recurrent boils that have required excision by Dr. Romona Curls. Lab values in the emergency  department are significant for white count of 14.5, lactic acid 2.7, creatinine 1.99, calcium 11.1 and proBNP of 814. Urinalysis yields many bacteria WBCs too numerous to count RBCs too numerous to count. Chest x-ray yields Possible pulmonary venous hypertension. Chronic prominent interstitial markings consistent with chronic lung disease. Cannot rule out an element of interstitial edema. Vital signs significant for a heart rate of 129, rectal temp of original he 105 trending down to 103. EKG yields SVT. Patient was given breathing treatments x2 and route to hospital and upon arrival respiratory distress was resolved. We are asked to admit    Review of Systems: 14 point review of systems negative except as noted in history of present illness appear  Past Medical History  Diagnosis Date  . Barrett's esophagus     EGD 03/23/2011 & EGD 2/09 bx proven  . Steatohepatitis     liver biopsy 2009  . DM (diabetes mellitus)   . Diverticulosis     TCS 03/23/11 pancolonic diverticula &TCS 5/08, pancolonic diverticula  . GERD (gastroesophageal reflux disease)   . HTN (hypertension)   . CVA (cerebral infarction) 1998    right sided deficit  . Gout   . Lymphoma 1974    XRT at [facility], right base of skull area  . Hepatitis     esosiniphilic, tx with prednisone  . Renal insufficiency   . 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 Hiatal hernia   . Colon polyp 03/23/2011    tubular adenoma, Dr. Gala Romney  . Hemorrhoids, internal 03/23/2011    tcs by Dr. Gala Romney  .  Hyperlipidemia   . Hemorrhagic colitis 06/06/2012.  . Glaucoma (increased eye pressure)   . Arthritis   . Asthma     "hx of"  . 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 Lower facial weakness     Right  . Esotropia of left eye   . Edema of lower extremity 12/21/12    bilateral   . BPH (benign prostatic hyperplasia)   . Hx of radiation therapy 1974    right base of skull area-lymphoma  . Stroke 1998    right  hemiparesis/plegia  . Chronic abdominal pain    Past Surgical History  Procedure Laterality Date  . Cholecystectomy    . Right lymph node removal    . Right video-assisted thoracic surgery, pleurectomy, and pleurodesis  2011  . Esophagogastroduodenoscopy  02/05/08    goblet cell metaplasia/negative for H.pylori  . Colonoscopy  03/23/11    Dr. Gala Romney  pancolonic diverticula, hemorrhoids, tubular adenoma.. next tcs 03/2016  . Esophagogastroduodenoscopy  03/23/11    Dr. Gala Romney, barretts, hiatal hernia  . Mass biopsy  11/01/2012    Procedure: NECK MASS BIOPSY;  Surgeon: Ascencion Dike, MD;  Location: [facility];  Service: ENT;  Laterality: Right;  Excisional Bx Right Neck Mass; attempted external jugular cutdown of left side  . Parotidectomy  11/24/2012    Procedure: PAROTIDECTOMY;  Surgeon: Ascencion Dike, MD;  Location: [place];  Service: ENT;  Laterality: N/A;  Total parotidectomy  . Pleurectomy     Social History:  reports that he quit smoking about 16 years ago. His smoking use included Cigarettes. He smoked 3.00 packs per day. He has never used smokeless tobacco. He reports that he does not drink alcohol or use illicit drugs. Patient quit smoking 15 years ago. He is wheelchair bound do to a stroke 15 years ago that left him with right-sided hemiparesis. He needs assistance with ADLs.  No Known Allergies  Family History  Problem Relation Age of Onset  . Heart failure Father   . Heart failure Mother   . Heart failure Sister   . Heart failure Son     Prior to Admission medications   Medication Sig Start Date End Date Taking? Authorizing Provider  allopurinol (ZYLOPRIM) 300 MG tablet Take 1 tablet (300 mg  total) by mouth daily. 05/23/13  Yes [name], MD  amLODipine (NORVASC) 10 MG tablet Take 1 tablet by mouth   daily 05/23/13  Yes [name], MD  cloNIDine (CATAPRES) 0.2 MG tablet Take 1 tablet (0.2 mg total) by mouth 3 (three) times daily. 01/11/12  Yes [name], MD   clotrimazole-betamethasone (LOTRISONE) cream Apply to affected area 2 times daily 08/24/12 08/24/13 Yes [name], MD  gabapentin (NEURONTIN) 300 MG capsule Take 2 capsules (600 mg total) by mouth 3 (three) times daily. 07/02/13  Yes [name], MD  hydrochlorothiazide (HYDRODIURIL) 25 MG tablet Take 1 tablet by mouth  daily 05/23/13  Yes [name], MD  insulin glargine (LANTUS) 100 UNIT/ML injection Inject 60 Units into the skin at bedtime. 09/17/12  Yes [name], MD  insulin lispro (HUMALOG) 100 UNIT/ML injection Inject 14-17 Units into the skin 3 (three) times daily before meals. Uses according to sliding scale at home. And 14 units QAC   Yes Historical Provider, MD  lisinopril (PRINIVIL,ZESTRIL) 20 MG tablet Take 1 tablet by mouth  daily 05/23/13  Yes [name], MD  metoprolol succinate (TOPROL-XL)  50 MG 24 hr tablet Take 1 tablet by mouth  daily with or immediately  following a meal 06/11/13  Yes [name], MD  naproxen sodium (ANAPROX) 220 MG tablet Take 440 mg by mouth daily as needed (pain).   Yes Historical Provider, MD  omeprazole (PRILOSEC) 40 MG capsule Take 1 capsule by mouth  daily 04/09/13  Yes [name], MD  oxyCODONE-acetaminophen (PERCOCET/ROXICET) 5-325 MG per tablet 1 tablet three times a day as needed for pain 07/02/13  Yes [name], MD  simvastatin (ZOCOR) 20 MG tablet Take 1 tablet by mouth at   bedtime 05/23/13  Yes [name], MD  tiZANidine (ZANAFLEX) 2 MG tablet Take 1 tablet (2 mg total) by mouth 2 (two) times daily as needed. 07/02/13  Yes [name], MD  TRADJENTA 5 MG TABS tablet Take 1 tablet by mouth  daily 05/23/13  Yes [name], MD  ULORIC 40 MG tablet Take 1 tablet by mouth   daily 06/05/13  Yes [name], MD   Physical Exam: Filed Vitals:   07/24/13 1018  BP: 104/55  Pulse: 115  Temp: 103.3 F (39.6 C)  Resp: 21     General:  Awake alert well-nourished no acute distress  Eyes:  EOMI, PERRLA, left eye with some drift. No scleral icterus  ENT: Ears clear nose without drainage oropharynx without erythema or exudate. Mucous membranes of his mouth are moist and pink.  Neck: Supple no JVD full range of motion no lymphadenopathy a well-healed scar on the right side of his neck.  Cardiovascular: Tachycardic but regular no gallop no murmur trace lower extremity edema  Respiratory: Normal effort breath sounds clear bilaterally good air movement no wheeze no rhonchi  Abdomen: Obese soft positive bowel sounds nontender to palpation no mass or organomegaly noted  Skin: Warm and dry back with several healed scars from lesion excision. Grape size mass just under left earlobe. Soft and tender to touch slight erythema. No induration no fluctuant  Musculoskeletal: Right-sided hemiparesis otherwise moves extremities no joint swelling or erythema. No clubbing no cyanosis  Psychiatric: Calm cooperative  Neurologic: Alert and oriented x3 slight facial droop some expressive aphasia but clear  Labs on Admission:  Basic Metabolic Panel:  Recent Labs Lab 07/24/13 0854  NA 137  K 4.3  CL 99  CO2 19  GLUCOSE 158*  BUN 40*  CREATININE 1.99*  CALCIUM 11.1*   Liver Function Tests: No results found for this basename: AST, ALT, ALKPHOS, BILITOT, PROT, ALBUMIN,  in the last 168 hours No results found for this basename: LIPASE, AMYLASE,  in the last 168 hours No results found for this basename: AMMONIA,  in the last 168 hours CBC:  Recent Labs Lab 07/24/13 0854  WBC 14.5*  NEUTROABS 13.1*  HGB 15.1  HCT 45.0  MCV 74.0*  PLT 181   Cardiac Enzymes:  Recent Labs Lab 07/24/13 0854  TROPONINI <0.30    BNP (last 3 results)  Recent Labs  07/24/13 0854  PROBNP 814.2*   CBG:  Recent Labs Lab 07/24/13 1150  GLUCAP 158*    Radiological Exams on Admission: Dg Chest Portable 1 View  07/24/2013   *RADIOLOGY REPORT*  Clinical Data: Shortness of breath  PORTABLE  CHEST - 1 VIEW  Comparison: 07/05/2011.  05/05/2010.  Findings: Heart size is normal.  The aorta is unfolded.  I think there is pulmonary venous hypertension.  There are chronically abnormal interstitial lung markings.  Cannot  rule out early interstitial edema.  No consolidation, collapse or effusion.  No acute bony finding.  IMPRESSION: Possible pulmonary venous hypertension.  Chronic prominent interstitial markings consistent with chronic lung disease.  Cannot rule out an element of interstitial edema.  Unfolded aorta.   Original Report Authenticated By: Nelson Chimes, M.D.    EKG: Independently reviewed yields sinus tach  Assessment/Plan Principal Problem:   Sepsis: Likely related to UTI. Will admit to step down. Will provide aggressive IV fluids. Will provide Tylenol for fever. Will check blood cultures and urine culture and start Rocephin and vancomycin. Chest x-ray without any indication of pneumonia. Patient with history of multiple skin lesions requiring excision. Currently with small area under his left ear. Patient currently hemodynamically stable. Will monitor closely Active Problems:  UTI: See #1. Will continue Rocephin that was started in the emergency department. Will check urine culture.    Acute on chronic renal failure: Stage II to 3. I clearly related to #1. Will hold any nephrotoxins. Will provide vigorous IV hydration. Will on its her intake and output. Will recheck in the a.m.    Tachycardia: Related to #1. Patient's heart rate trending downward at time of admission exam. Of note patient is on metoprolol at home but we'll be holding at time of admission do to somewhat soft blood pressure and #1. Will monitor closel    Hypotension: Do to #1 and #2. Will provide IV fluids. Will hold his Norvasc Catapres HCTZ and lisinopril and metoprolol. Systolic blood pressure range 90- 104. Once blood pressure stabilizes will introduce her antihypertensives as indicated   Leukocytosis: Likely  related to #2. Will continue antibiotics as indicated above. Will monitor closely.    DM: Will check hemoglobin A1c we'll provide car modified diet. Patient is on 60 units of Lantus at home will provide one third that dose and use sliding scale for glycemic control    Gout, unspecified: Stable at baseline patient on allopurinol will hold for now    HYPERTENSION: Patient's hypotensive see above    CVA: Patient wheelchair-bound with right hemiparesis. Appears to be stable at baseline.    Recurrent boils: Possible active mass under left earlobe. Tender to touch slightly pink and swollen. Patient reports tenderness and gross over last week or so    Diabetic neuropathy: Stable at baseline     Obesity: Request nutritional consult    Hemiplegia affecting dominant side, late effect of cerebrovascular disease: We'll request PT when patient over acute phase of illness    GERD (gastroesophageal reflux disease); stable at baseline continue PPI    Asthma: Patient reports no asthma issues since he was a child however this a.m. he experienced sudden wheezing that responded positively to nebulizer treatments. Will monitor his oxygen saturation levels and provide nebs. Chest x-ray yields chronic prominent interstitial markings consistent with chronic lung disease    Cancer of parotid gland: Stable at baseline  Code Status: DO NOT RESUSCITATE Family Communication: Wife at bedside Disposition Plan: Home when ready likely 2-3 days Time spent: 61 minutes  [facility] Pager [phone]  If 7PM-7AM, please contact night-coverage [email or web address] Password [facility] 07/24/2013, 11:54 AM  Attending note:  Patient seen and independently examined. Above note reviewed.  Case discussed with Dyanne Carrel, NP. Patient has been admitted with a sepsis picture.  He is febrile, tachycardic, has a leukocytosis and source at this time appears to be urine.  Skin exam reveals 2 subcutaneous nodular areas,  that do not appear to be actively  infected at this time.  Per wife, these findings are somewhat chronic.  Will discontinue vancomycin for now, but continue with rocephin. Follow up cultures.  He is complaining of diffuse abdominal pain on palpation.  Will obtain CT abdomen to further evaluate and add on lfts to labs sent earlier today.  Monitor in stepdown unit for the next 24 hours to ensure stability.  MEMON,JEHANZEB

## 2013-07-24 NOTE — ED Notes (Signed)
EMS called for resp distress. Pt arrived alert/oriented, on nonrebreather. Per ems, pt is much better now than upon their first arrival. Pt c/o sob/fever/chills yesterday. Has had total 3 albuterol and one atrovent en route. cbg 146 in route. 100.0 temporal temp per ems.  Pt denies sob at this time and states he feels better. C/o abd pain

## 2013-07-24 NOTE — Progress Notes (Signed)
UR Chart Review Completed  

## 2013-07-24 NOTE — Progress Notes (Signed)
ANTIBIOTIC CONSULT NOTE - INITIAL  Pharmacy Consult for Vancomycin & Rocephin Indication: UTI, diabetic ulcer, r/o PNA  No Known Allergies  Patient Measurements:   Last measured wt: 108.9kg on 07/16/13  Vital Signs: Temp: 103.3 F (39.6 C) (08/13 1018) Temp src: Rectal (08/13 1018) BP: 104/55 mmHg (08/13 1018) Pulse Rate: 115 (08/13 1018) Intake/Output from previous day:   Intake/Output from this shift: Total I/O In: -  Out: 60 [Urine:60]  Labs:  Recent Labs  07/24/13 0854  WBC 14.5*  HGB 15.1  PLT 181  CREATININE 1.99*   The CrCl is unknown because both a height and weight (above a minimum accepted value) are required for this calculation. No results found for this basename: VANCOTROUGH, VANCOPEAK, VANCORANDOM, GENTTROUGH, GENTPEAK, GENTRANDOM, TOBRATROUGH, TOBRAPEAK, TOBRARND, AMIKACINPEAK, AMIKACINTROU, AMIKACIN,  in the last 72 hours   Microbiology: No results found for this or any previous visit (from the past 720 hour(s)).  Medical History: Past Medical History  Diagnosis Date  . Barrett's esophagus     EGD 03/23/2011 & EGD 2/09 bx proven  . Steatohepatitis     liver biopsy 2009  . DM (diabetes mellitus)   . Diverticulosis     TCS 03/23/11 pancolonic diverticula &TCS 5/08, pancolonic diverticula  . GERD (gastroesophageal reflux disease)   . HTN (hypertension)   . CVA (cerebral infarction) 1998    right sided deficit  . Gout   . Lymphoma 1974    XRT at [facility], right base of skull area  . Hepatitis     esosiniphilic, tx with prednisone  . Renal insufficiency   . 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 Hiatal hernia   . Colon polyp 03/23/2011    tubular adenoma, Dr. Gala Romney  . Hemorrhoids, internal 03/23/2011    tcs by Dr. Gala Romney  . Hyperlipidemia   . Hemorrhagic colitis 06/06/2012.  . Glaucoma (increased eye pressure)   . Arthritis   . Asthma     "hx of"  . 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 Lower facial  weakness     Right  . Esotropia of left eye   . Edema of lower extremity 12/21/12    bilateral   . BPH (benign prostatic hyperplasia)   . Hx of radiation therapy 1974    right base of skull area-lymphoma  . Stroke 1998    right hemiparesis/plegia  . Chronic abdominal pain     Medications:  Scheduled:  . albuterol  2.5 mg Nebulization Q6H  . [START ON 07/25/2013] cefTRIAXone (ROCEPHIN)  IV  1 g Intravenous Q24H  . heparin  5,000 Units Subcutaneous Q8H  . insulin aspart  0-15 Units Subcutaneous TID WC  . insulin aspart  0-5 Units Subcutaneous QHS  . insulin glargine  20 Units Subcutaneous QHS  . ipratropium  0.5 mg Nebulization Q6H  . pantoprazole  40 mg Oral Daily  . vancomycin  2,500 mg Intravenous Once   Followed by  . [START ON 07/25/2013] vancomycin  1,500 mg Intravenous Q24H   Assessment: 67 yo obese M admitted with respiratory distress, fever.  WBC and lactic acid levels are elevated.  Starting on empiric Vanc & Rocephin to cover for UTI, PNA, and diabetic skin lesions.  Scr is elevated above patient's baseline on admission.  Received 1st dose of Rocephin in ED ~ 1000.   Goal of Therapy:  Vancomycin trough level 15-20 mcg/ml  Plan:  1) Rocephin 1gm IV q24 2) Vancomycin  2500 mg IV q24h followed by 1500 mg IV q24h 3) Check Vancomycin trough at steady state 4) Monitor renal function and cx data    Biagio Borg 07/24/2013,12:18 PM

## 2013-07-24 NOTE — ED Provider Notes (Signed)
CSN: [id number]     Arrival date & time 07/24/13  0809 History     First MD Initiated Contact with Patient 07/24/13 0818     Chief Complaint  Patient presents with  . Shortness of Breath    HPI Pt was seen at 0820. Per EMS, pt and his wife, c/o gradual onset and persistence of constant cough, SOB and subjective home fevers/chills since yesterday. Pt's wife states he has been "wheezing" since last night. EMS noted pt to be diaphoretic with his temporal temp to be "100" on their arrival to scene. EMS gave 3 albuterol/1 atrovent en route with improvement in pt's c/o wheezing and SOB. Pt denies any complaints other than his "usual" chronic left upper abd "pain."  Denies CP/palpitations, no rash, no N/V/D, no back pain.     Past Medical History  Diagnosis Date  . Barrett's esophagus     EGD 03/23/2011 & EGD 2/09 bx proven  . Steatohepatitis     liver biopsy 2009  . DM (diabetes mellitus)   . Diverticulosis     TCS 03/23/11 pancolonic diverticula &TCS 5/08, pancolonic diverticula  . GERD (gastroesophageal reflux disease)   . HTN (hypertension)   . CVA (cerebral infarction) 1998    right sided deficit  . Gout   . Lymphoma 1974    XRT at [facility], right base of skull area  . Hepatitis     esosiniphilic, tx with prednisone  . Renal insufficiency   . 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 Hiatal hernia   . Colon polyp 03/23/2011    tubular adenoma, Dr. Gala Romney  . Hemorrhoids, internal 03/23/2011    tcs by Dr. Gala Romney  . Hyperlipidemia   . Hemorrhagic colitis 06/06/2012.  . Glaucoma (increased eye pressure)   . Arthritis   . Asthma     "hx of"  . 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 Lower facial weakness     Right  . Esotropia of left eye   . Edema of lower extremity 12/21/12    bilateral   . BPH (benign prostatic hyperplasia)   . Hx of radiation therapy 1974    right base of skull area-lymphoma  . Stroke 1998    right hemiparesis/plegia  .  Chronic abdominal pain    Past Surgical History  Procedure Laterality Date  . Cholecystectomy    . Right lymph node removal    . Right video-assisted thoracic surgery, pleurectomy, and pleurodesis  2011  . Esophagogastroduodenoscopy  02/05/08    goblet cell metaplasia/negative for H.pylori  . Colonoscopy  03/23/11    Dr. Gala Romney  pancolonic diverticula, hemorrhoids, tubular adenoma.. next tcs 03/2016  . Esophagogastroduodenoscopy  03/23/11    Dr. Gala Romney, barretts, hiatal hernia  . Mass biopsy  11/01/2012    Procedure: NECK MASS BIOPSY;  Surgeon: Ascencion Dike, MD;  Location: [facility];  Service: ENT;  Laterality: Right;  Excisional Bx Right Neck Mass; attempted external jugular cutdown of left side  . Parotidectomy  11/24/2012    Procedure: PAROTIDECTOMY;  Surgeon: Ascencion Dike, MD;  Location: [place];  Service: ENT;  Laterality: N/A;  Total parotidectomy  . Pleurectomy     Family History  Problem Relation Age of Onset  . Heart failure Father   . Heart failure Mother   . Heart failure Sister   . Heart failure Son    History  Substance Use Topics  . Smoking status: Former  Smoker -- 3.00 packs/day    Types: Cigarettes    Quit date: 03/01/1997  . Smokeless tobacco: Never Used  . Alcohol Use: No    Review of Systems ROS: Statement: All systems negative except as marked or noted in the HPI; Constitutional: +subjective fever and chills. ; ; Eyes: Negative for eye pain, redness and discharge. ; ; ENMT: Negative for ear pain, hoarseness, nasal congestion, sinus pressure and sore throat. ; ; Cardiovascular: +diaphoresis. Negative for chest pain, palpitations, and peripheral edema. ; ; Respiratory: +cough, wheezing, SOB. Negative for stridor. ; ; Gastrointestinal: Negative for nausea, vomiting, diarrhea, blood in stool, hematemesis, jaundice and rectal bleeding. . ; ; Genitourinary: Negative for dysuria, flank pain and hematuria. ; ; Musculoskeletal: Negative for back pain and neck pain. Negative for  swelling and trauma.; ; Skin: Negative for pruritus, rash, abrasions, blisters, bruising and skin lesion.; ; Neuro: Negative for headache, lightheadedness and neck stiffness. Negative for weakness, altered level of consciousness , altered mental status, extremity weakness, paresthesias, involuntary movement, seizure and syncope.      Allergies  Review of patient's allergies indicates no known allergies.  Home Medications   Current Outpatient Rx  Name  Route  Sig  Dispense  Refill  . allopurinol (ZYLOPRIM) 300 MG tablet      Take 1 tablet (300 mg  total) by mouth daily.   30 tablet   1   . amLODipine (NORVASC) 10 MG tablet      Take 1 tablet by mouth   daily   30 tablet   1   . cloNIDine (CATAPRES) 0.2 MG tablet   Oral   Take 1 tablet (0.2 mg total) by mouth 3 (three) times daily.   270 tablet   1   . clotrimazole-betamethasone (LOTRISONE) cream      Apply to affected area 2 times daily   45 g   1   . gabapentin (NEURONTIN) 300 MG capsule   Oral   Take 2 capsules (600 mg total) by mouth 3 (three) times daily.   540 capsule   1   . hydrochlorothiazide (HYDRODIURIL) 25 MG tablet      Take 1 tablet by mouth  daily   30 tablet   1   . insulin glargine (LANTUS) 100 UNIT/ML injection   Subcutaneous   Inject 60 Units into the skin at bedtime.   15 mL   0   . insulin lispro (HUMALOG) 100 UNIT/ML injection   Subcutaneous   Inject 14-17 Units into the skin 3 (three) times daily before meals. Uses according to sliding scale at home. And 14 units QAC         . lisinopril (PRINIVIL,ZESTRIL) 20 MG tablet      Take 1 tablet by mouth  daily   30 tablet   1   . metoprolol succinate (TOPROL-XL) 50 MG 24 hr tablet      Take 1 tablet by mouth  daily with or immediately  following a meal   30 tablet   3   . omeprazole (PRILOSEC) 40 MG capsule      Take 1 capsule by mouth  daily   90 capsule   0   . oxyCODONE-acetaminophen (PERCOCET/ROXICET) 5-325 MG per tablet       1 tablet three times a day as needed for pain   90 tablet   0   . simvastatin (ZOCOR) 20 MG tablet      Take 1 tablet by mouth at  bedtime   30 tablet   1   . Tamsulosin HCl (FLOMAX) 0.4 MG CAPS   Oral   Take 1 capsule (0.4 mg total) by mouth daily.   90 capsule   1   . tiZANidine (ZANAFLEX) 2 MG tablet   Oral   Take 1 tablet (2 mg total) by mouth 2 (two) times daily as needed.   60 tablet   2   . TRADJENTA 5 MG TABS tablet      Take 1 tablet by mouth  daily   30 tablet   1   . ULORIC 40 MG tablet      Take 1 tablet by mouth   daily   30 tablet   1    BP 141/89  Pulse 159  Temp(Src) 105 F (40.6 C) (Rectal)  Resp 25  SpO2 98% Filed Vitals:   07/24/13 0837 07/24/13 0915 07/24/13 0924 07/24/13 1018  BP:   107/71 104/55  Pulse:   129 115  Temp: 99.1 F (37.3 C) 105 F (40.6 C)  103.3 F (39.6 C)  TempSrc: Oral Rectal  Rectal  Resp:   22 21  SpO2:   96% 95%    Physical Exam 0825: Physical examination:  Nursing notes reviewed; Vital signs and O2 SAT reviewed; +febrile.;; Constitutional: Well developed, Well nourished, In no acute distress; Head:  Normocephalic, atraumatic; Eyes: EOMI, PERRL, No scleral icterus; ENMT: Mouth and pharynx normal, Mucous membranes dry; Neck: Supple, Full range of motion, No lymphadenopathy; Cardiovascular: Tachycardic rate and rhythm, No gallop; Respiratory: Breath sounds coarse & equal bilaterally, No wheezes.  Speaking full sentences with ease, Normal respiratory effort/excursion; Chest: Nontender, Movement normal; Abdomen: Soft, Nontender, Nondistended, Normal bowel sounds; Genitourinary: No CVA tenderness; Extremities: Pulses normal, No tenderness, No edema, No calf edema or asymmetry.; Neuro: AA&Ox3, Major CN grossly intact. Speech clear. +right sided hemiparesis per hx of previous CVA. Otherwise, no new gross focal motor deficits in extremities.; Skin: Color normal, Warm, Diaphoretic.   ED Course     Procedures      MDM  MDM Reviewed: previous chart, nursing note and vitals Reviewed previous: labs and ECG Interpretation: labs, ECG and x-ray Total time providing critical care: 30-74 minutes. This excludes time spent performing separately reportable procedures and services. Consults: admitting MD   CRITICAL CARE Performed by: Alfonzo Feller Total critical care time: 35 Critical care time was exclusive of separately billable procedures and treating other patients. Critical care was necessary to treat or prevent imminent or life-threatening deterioration. Critical care was time spent personally by me on the following activities: development of treatment plan with patient and/or surrogate as well as nursing, discussions with consultants, evaluation of patient's response to treatment, examination of patient, obtaining history from patient or surrogate, ordering and performing treatments and interventions, ordering and review of laboratory studies, ordering and review of radiographic studies, pulse oximetry and re-evaluation of patient's condition.    Date: 07/24/2013  Rate: 160  Rhythm: sinus tachycardia  QRS Axis: normal  Intervals: normal  ST/T Wave abnormalities: nonspecific ST/T changes  Conduction Disutrbances:none  Narrative Interpretation:   Old EKG Reviewed: changes noted; previous EKG with [facility] dated 10/15/2012 with NSR.  Results for orders placed during the hospital encounter of 07/24/13  CBC WITH DIFFERENTIAL      Result Value Range   WBC 14.5 (*) 4.0 - 10.5 K/uL   RBC 6.08 (*) 4.22 - 5.81 MIL/uL   Hemoglobin 15.1  13.0 - 17.0 g/dL   HCT 45.0  39.0 - 52.0 %   MCV 74.0 (*) 78.0 - 100.0 fL   MCH 24.8 (*) 26.0 - 34.0 pg   MCHC 33.6  30.0 - 36.0 g/dL   RDW 17.3 (*) 11.5 - 15.5 %   Platelets 181  150 - 400 K/uL   Neutrophils Relative % 91 (*) 43 - 77 %   Neutro Abs 13.1 (*) 1.7 - 7.7 K/uL   Lymphocytes Relative 4 (*) 12 - 46 %   Lymphs Abs 0.6 (*) 0.7 - 4.0 K/uL   Monocytes  Relative 5  3 - 12 %   Monocytes Absolute 0.7  0.1 - 1.0 K/uL   Eosinophils Relative 0  0 - 5 %   Eosinophils Absolute 0.0  0.0 - 0.7 K/uL   Basophils Relative 0  0 - 1 %   Basophils Absolute 0.0  0.0 - 0.1 K/uL   WBC Morphology INCREASED BANDS (>20% BANDS)    BASIC METABOLIC PANEL      Result Value Range   Sodium 137  135 - 145 mEq/L   Potassium 4.3  3.5 - 5.1 mEq/L   Chloride 99  96 - 112 mEq/L   CO2 19  19 - 32 mEq/L   Glucose, Bld 158 (*) 70 - 99 mg/dL   BUN 40 (*) 6 - 23 mg/dL   Creatinine, Ser 1.99 (*) 0.50 - 1.35 mg/dL   Calcium 11.1 (*) 8.4 - 10.5 mg/dL   GFR calc non Af Amer 33 (*) >90 mL/min   GFR calc Af Amer 39 (*) >90 mL/min  TROPONIN I      Result Value Range   Troponin I <0.30  <0.30 ng/mL  PRO B NATRIURETIC PEPTIDE      Result Value Range   Pro B Natriuretic peptide (BNP) 814.2 (*) 0 - 125 pg/mL  URINALYSIS W MICROSCOPIC + REFLEX CULTURE      Result Value Range   Color, Urine YELLOW  YELLOW   APPearance HAZY (*) CLEAR   Specific Gravity, Urine >1.030 (*) 1.005 - 1.030   pH 6.0  5.0 - 8.0   Glucose, UA NEGATIVE  NEGATIVE mg/dL   Hgb urine dipstick LARGE (*) NEGATIVE   Bilirubin Urine NEGATIVE  NEGATIVE   Ketones, ur NEGATIVE  NEGATIVE mg/dL   Protein, ur >300 (*) NEGATIVE mg/dL   Urobilinogen, UA 1.0  0.0 - 1.0 mg/dL   Nitrite NEGATIVE  NEGATIVE   Leukocytes, UA NEGATIVE  NEGATIVE   WBC, UA TOO NUMEROUS TO COUNT  <3 WBC/hpf   RBC / HPF TOO NUMEROUS TO COUNT  <3 RBC/hpf   Bacteria, UA MANY (*) RARE   Casts GRANULAR CAST (*) NEGATIVE  LACTIC ACID, PLASMA      Result Value Range   Lactic Acid, Venous 2.7 (*) 0.5 - 2.2 mmol/L   Dg Chest Portable 1 View 07/24/2013   *RADIOLOGY REPORT*  Clinical Data: Shortness of breath  PORTABLE CHEST - 1 VIEW  Comparison: 07/05/2011.  05/05/2010.  Findings: Heart size is normal.  The aorta is unfolded.  I think there is pulmonary venous hypertension.  There are chronically abnormal interstitial lung markings.  Cannot rule  out early interstitial edema.  No consolidation, collapse or effusion.  No acute bony finding.  IMPRESSION: Possible pulmonary venous hypertension.  Chronic prominent interstitial markings consistent with chronic lung disease.  Cannot rule out an element of interstitial edema.  Unfolded aorta.   Original Report Authenticated By: Nelson Chimes, M.D.    Results for Batt, Mikeal Hawthorne  A (MRN [id number]) as of 07/24/2013 10:53  Ref. Range 08/11/2012 21:07 10/25/2012 10:44 11/23/2012 09:43 07/24/2013 08:54  BUN Latest Range: 4-21 mg/dL 21 29 (H) 20 40 (H)  Creatinine Latest Range: 0.50-1.35 mg/dL 1.37 (H) 1.27 1.05 1.99 (H)    1050:  Pt febrile and diaphoretic on arrival; APAP given with improvement. HR trending down with IVF and improvement of fever. Appears clinically dehydrated, IVF continued.  +UTI, UC pending. IV rocephin given. Pt continues A&O, NAD, resps without distress. Neuro status per baseline. Dx and testing d/w pt and family.  Questions answered.  Verb understanding, agreeable to admit.  T/C to [facility] Dr. Roderic Palau, case discussed, including:  HPI, pertinent PM/SHx, VS/PE, dx testing, ED course and treatment:  Agreeable to admit, requests to write temporary orders, obtain stepdown bed to team 1.      Alfonzo Feller, DO 07/25/13 [phone]

## 2013-07-25 ENCOUNTER — Inpatient Hospital Stay (HOSPITAL_COMMUNITY): Payer: Medicare Other

## 2013-07-25 DIAGNOSIS — J189 Pneumonia, unspecified organism: Secondary | ICD-10-CM | POA: Diagnosis present

## 2013-07-25 DIAGNOSIS — R7881 Bacteremia: Secondary | ICD-10-CM | POA: Diagnosis present

## 2013-07-25 DIAGNOSIS — R0989 Other specified symptoms and signs involving the circulatory and respiratory systems: Secondary | ICD-10-CM

## 2013-07-25 DIAGNOSIS — R0609 Other forms of dyspnea: Secondary | ICD-10-CM

## 2013-07-25 LAB — CBC
HCT: 40.7 % (ref 39.0–52.0)
MCHC: 32.9 g/dL (ref 30.0–36.0)
MCV: 74 fL — ABNORMAL LOW (ref 78.0–100.0)
Platelets: 196 10*3/uL (ref 150–400)
RDW: 17 % — ABNORMAL HIGH (ref 11.5–15.5)
WBC: 14.7 10*3/uL — ABNORMAL HIGH (ref 4.0–10.5)

## 2013-07-25 LAB — GLUCOSE, CAPILLARY
Glucose-Capillary: 171 mg/dL — ABNORMAL HIGH (ref 70–99)
Glucose-Capillary: 210 mg/dL — ABNORMAL HIGH (ref 70–99)
Glucose-Capillary: 205 mg/dL — ABNORMAL HIGH (ref 70–99)

## 2013-07-25 LAB — BASIC METABOLIC PANEL
BUN: 42 mg/dL — ABNORMAL HIGH (ref 6–23)
Calcium: 10.5 mg/dL (ref 8.4–10.5)
Chloride: 102 mEq/L (ref 96–112)
Creatinine, Ser: 1.94 mg/dL — ABNORMAL HIGH (ref 0.50–1.35)
GFR calc Af Amer: 40 mL/min — ABNORMAL LOW (ref >90)

## 2013-07-25 MED ORDER — ONDANSETRON HCL 4 MG/2ML IJ SOLN
4.0000 mg | INTRAMUSCULAR | Status: DC | PRN
  Administered 2013-07-25 – 2013-07-26 (×4): 4 mg via INTRAVENOUS
  Filled 2013-07-25 (×4): qty 2

## 2013-07-25 MED ORDER — METOPROLOL TARTRATE 25 MG PO TABS: 12.5000 mg | ORAL_TABLET | Freq: Two times a day (BID) | ORAL | Status: DC

## 2013-07-25 MED ORDER — FUROSEMIDE 10 MG/ML IJ SOLN
40.0000 mg | Freq: Once | INTRAMUSCULAR | Status: AC
  Administered 2013-07-25: 40 mg via INTRAVENOUS
  Filled 2013-07-25: qty 4

## 2013-07-25 MED ORDER — PROMETHAZINE HCL 25 MG/ML IJ SOLN
12.5000 mg | Freq: Three times a day (TID) | INTRAMUSCULAR | Status: DC | PRN
  Administered 2013-07-25 – 2013-07-26 (×3): 12.5 mg via INTRAVENOUS
  Filled 2013-07-25 (×3): qty 1

## 2013-07-25 MED ORDER — INSULIN GLARGINE 100 UNIT/ML ~~LOC~~ SOLN
25.0000 [IU] | Freq: Every day | SUBCUTANEOUS | Status: DC
  Administered 2013-07-25 – 2013-07-30 (×6): 25 [IU] via SUBCUTANEOUS
  Filled 2013-07-25 (×7): qty 0.25

## 2013-07-25 MED ORDER — FEBUXOSTAT 40 MG PO TABS
40.0000 mg | ORAL_TABLET | Freq: Every day | ORAL | Status: DC
  Administered 2013-07-27 – 2013-07-30 (×4): 40 mg via ORAL
  Filled 2013-07-25 (×10): qty 1

## 2013-07-25 MED ORDER — AZITHROMYCIN 500 MG IV SOLR
500.0000 mg | INTRAVENOUS | Status: DC
  Administered 2013-07-25 – 2013-07-30 (×6): 500 mg via INTRAVENOUS
  Filled 2013-07-25 (×7): qty 500

## 2013-07-25 MED ORDER — METOPROLOL TARTRATE 25 MG PO TABS
25.0000 mg | ORAL_TABLET | Freq: Two times a day (BID) | ORAL | Status: DC
  Administered 2013-07-25 (×2): 25 mg via ORAL
  Filled 2013-07-25 (×3): qty 1

## 2013-07-25 MED ORDER — HYDRALAZINE HCL 20 MG/ML IJ SOLN
10.0000 mg | Freq: Four times a day (QID) | INTRAMUSCULAR | Status: DC | PRN
  Administered 2013-07-25 – 2013-07-30 (×10): 10 mg via INTRAVENOUS
  Filled 2013-07-25 (×11): qty 1

## 2013-07-25 NOTE — Progress Notes (Signed)
*  PRELIMINARY RESULTS* Echocardiogram 2D Echocardiogram has been performed.  [name] 07/25/2013, 2:57 PM

## 2013-07-25 NOTE — Progress Notes (Signed)
Inpatient Diabetes Program Recommendations  AACE/ADA: New Consensus Statement on Inpatient Glycemic Control (2013)  Target Ranges:  Prepandial:   less than 140 mg/dL      Peak postprandial:   less than 180 mg/dL (1-2 hours)      Critically ill patients:  140 - 180 mg/dL    Results for TAYVIEN, KANE (MRN [id number]) as of 07/25/2013 10:09  Ref. Range 11/25/2012 07:47 07/24/2013 11:50 07/24/2013 16:50 07/24/2013 20:40 07/25/2013 07:22  Glucose-Capillary Latest Range: 70-99 mg/dL 268 (H) 158 (H) 249 (H) 217 (H) 171 (H)   Inpatient Diabetes Program Recommendations Insulin - Basal: Please consider increasing Lantus to 25 units QHS.  Note: Patient has a history of diabetes and takes Lantus 60 units QHS, Humalog 14-17 units TID with meals, and Tradjenta 5 mg daily at home for diabetes management.  Currently, patient is ordered to receive Lantus 20 units QHS, Novolog 0-15 units AC, and Novolog 0-5 units HS for inpatient glycemic control.  Blood glucose over the past 24 hours has ranged from 158-268 mg/dl.  Lantus 20 units QHS was given last night at 21:22.  Fasting blood glucose this morning was 171 mg/dl.  Please consider increasing Lantus to 25 units QHS.  Will continue to follow.  Thanks, Barnie Alderman, RN, MSN, CCRN Diabetes Coordinator Inpatient Diabetes Program [phone]

## 2013-07-25 NOTE — Progress Notes (Signed)
Blood culture revealed gram negative rods in aerobic bottle; MD notified.

## 2013-07-25 NOTE — Progress Notes (Signed)
[facility] PROGRESS NOTE  [name] JXB:[id number] DOB: Apr 21, 1946 DOA: 07/24/2013 PCP: Vic Blackbird, MD  Assessment/Plan: Sepsis: Likely related to UTI. Blood culture +gm neg rods. Max temp 100.2 orally since admission. Continue Rocephin day #2.  Pt with nausea, vomiting and hypoxia during night. Emesis consisted of undigested food. No coffee ground emesis. CT of abdomen without acute process. Will change diet to clear liquid and provide phenergan low dose for intractable n/v. Will repeat chest xray. Pt does not appear volume overloaded but volume status +1L. Also concern for developing pna given recent hypoxia development. Will monitor closely   Active Problems:  Bacteremia: blood cultures with gm+rods. See #1. Will continue rocephin day #2 and follow chest xray.   Hypoxia: Last night oxygen saturation level dropped to 87% on room air. Oxygen support provided. This am sats 96% on 3L. Will get chest xray. Concern for edema vs infection. Will follow  UTI: See #1. Will continue Rocephin that was started in the emergency department. Await urine culture.   Acute on chronic renal failure: Stage 3. Related to #1. Slight worsening this am.  Continue to l hold any nephrotoxins. IV fluids at lower rate. Urine output marginal.    Tachycardia: Related to #1. Range 96-102. Monitor   Hypertension: During night SBP range139-162 with DBP range 88-100. Continue to  hold his Norvasc Catapres HCTZ and lisinopril. Restart metoprolol.   Leukocytosis: Likely related to #2 and #4. Will continue antibiotics as indicated above. Will monitor closely.   DM: Hemoglobin A1c 7.2. Patient is on 60 units of Lantus at home will provide one third that dose and use sliding scale for glycemic control. Have down graded diet to clear liquids due to nausea/vomiting.   Nausea/vomiting: likely related to #2 and #4. Ct abdomen without acute abdominal process. Provide anti-emetic and clear liquids.   Gout,  unspecified: Stable at baseline. Continue allopurinol.   CVA: Patient wheelchair-bound with right hemiparesis. Appears to be stable at baseline.   Recurrent boils: Stable at baseline  Diabetic neuropathy: Stable at baseline  Obesity: BMI 32.7.  Request nutritional consult when acute phase of illness resolved.   Hemiplegia affecting dominant side, late effect of cerebrovascular disease: We'll request PT when patient over acute phase of illness   GERD (gastroesophageal reflux disease); stable at baseline continue PPI   Asthma: Patient reports no asthma issues since he was a child however on admission he experienced sudden wheezing that responded positively to nebulizer treatments.See #3. Will monitor his oxygen saturation levels and provide nebs. Repeat chest xray  Cancer of parotid gland: Stable at baseline    Code Status: DNR Family Communication: son at bedside Disposition Plan: home when ready   Consultants:  none  Procedures:  none  Antibiotics:  Rocephin 07/24/13>>  HPI/Subjective: Alert, denies pain. States abdominal discomfort no worse than his usual. Complained of nausea and vomited moderate amount undigested food.  Objective: Filed Vitals:   07/25/13 0800  BP: 154/97  Pulse: 101  Temp: 99 F (37.2 C)  Resp: 21    Intake/Output Summary (Last 24 hours) at 07/25/13 0831 Last data filed at 07/25/13 0600  Gross per 24 hour  Intake   1970 ml  Output    260 ml  Net   1710 ml   Filed Weights   07/24/13 1400 07/25/13 0500  Weight: 240 lb 4.8 oz (109 kg) 278 lb 14.1 oz (126.5 kg)    Exam:   General:  Alert somewhat ill appearing  Cardiovascular: tachycardic  but regular No MGR Trace LE edema  Respiratory: normal effort somewhat shallow. BS distant but clear no wheeze or crackles  Abdomen: obese slightly firm, non-tender to palpation sluggish BS  Musculoskeletal:  Right hemiparesis. Other extremities with spontaneous movement. Fair tone  Data  Reviewed: Basic Metabolic Panel:  Recent Labs Lab 07/24/13 0854 07/25/13 0511  NA 137 137  K 4.3 3.9  CL 99 102  CO2 19 24  GLUCOSE 158* 178*  BUN 40* 42*  CREATININE 1.99* 1.94*  CALCIUM 11.1* 10.5   Liver Function Tests:  Recent Labs Lab 07/24/13 1211  AST 20  ALT 13  ALKPHOS 85  BILITOT 0.5  PROT 7.1  ALBUMIN 2.9*   No results found for this basename: LIPASE, AMYLASE,  in the last 168 hours No results found for this basename: AMMONIA,  in the last 168 hours CBC:  Recent Labs Lab 07/24/13 0854 07/25/13 0511  WBC 14.5* 14.7*  NEUTROABS 13.1*  --   HGB 15.1 13.4  HCT 45.0 40.7  MCV 74.0* 74.0*  PLT 181 196   Cardiac Enzymes:  Recent Labs Lab 07/24/13 0854  TROPONINI <0.30   BNP (last 3 results)  Recent Labs  07/24/13 0854  PROBNP 814.2*   CBG:  Recent Labs Lab 07/24/13 1150 07/24/13 1650 07/24/13 2040 07/25/13 0722  GLUCAP 158* 249* 217* 171*    Recent Results (from the past 240 hour(s))  CULTURE, BLOOD (ROUTINE X 2)     Status: None   Collection Time    07/24/13  9:55 AM      Result Value Range Status   Specimen Description BLOOD LEFT HAND   Final   Special Requests BOTTLES DRAWN AEROBIC AND ANAEROBIC 8CC   Final   Culture     Final   Value: GRAM NEGATIVE RODS     Gram Stain Report Called to,Read Back By and Verified With: Myra Gianotti AT 0303 ON [id number] BY FORSYTH K   Report Status PENDING   Incomplete  MRSA PCR SCREENING     Status: None   Collection Time    07/24/13 12:10 PM      Result Value Range Status   MRSA by PCR NEGATIVE  NEGATIVE Final   Comment:            The GeneXpert MRSA Assay (FDA     approved for NASAL specimens     only), is one component of a     comprehensive MRSA colonization     surveillance program. It is not     intended to diagnose MRSA     infection nor to guide or     monitor treatment for     MRSA infections.  CULTURE, BLOOD (ROUTINE X 2)     Status: None   Collection Time    07/24/13 12:11 PM       Result Value Range Status   Specimen Description Blood   Final   Special Requests NONE   Final   Culture NO GROWTH <24 HRS   Final   Report Status PENDING   Incomplete     Studies: Ct Abdomen Pelvis Wo Contrast  07/24/2013   *RADIOLOGY REPORT*  Clinical Data: Abdominal pain, cough, chills.  Chronic renal insufficiency, hypertension.  CT ABDOMEN AND PELVIS WITHOUT CONTRAST  Technique:  Multidetector CT imaging of the abdomen and pelvis was performed following the standard protocol without intravenous contrast.  Comparison: 08/11/2012  Findings: Chronic pleural thickening posteriorly at the left lung base. Surgical clips  in the gallbladder fossa.  Unremarkable uninfused evaluation of liver, spleen, adrenal glands, pancreas.  Stable low- attenuation renal lesions corresponding to a cyst previously identified.  No hydronephrosis.  Streaky inflammatory/edematous changes around bilateral kidneys, more conspicuous than on prior exam.  No hydronephrosis or ureterectasis.  No nephrolithiasis. Patchy aortoiliac arterial calcifications without aneurysm. Stomach, small bowel, and colon are nondilated.  Urinary bladder incompletely distended.  Moderate prostatic enlargement.  The stable coarse right pelvic vascular calcifications.  No ascites. No free air.  No adenopathy.  Scattered ascending, descending and descending colon diverticula without significant adjacent inflammatory/edematous change.  Coarse sclerotic impairment of appearance of the L3 vertebral body with compression deformity involving the inferior endplates centrally without significant loss of height, stable since previous exam.  IMPRESSION:  1.  No acute abdominal process. 2.  Ascending and descending colonic diverticulosis as before.   Original Report Authenticated By: D. Wallace Going, MD   Dg Chest Portable 1 View  07/24/2013   *RADIOLOGY REPORT*  Clinical Data: Shortness of breath  PORTABLE CHEST - 1 VIEW  Comparison: 07/05/2011.  05/05/2010.   Findings: Heart size is normal.  The aorta is unfolded.  I think there is pulmonary venous hypertension.  There are chronically abnormal interstitial lung markings.  Cannot rule out early interstitial edema.  No consolidation, collapse or effusion.  No acute bony finding.  IMPRESSION: Possible pulmonary venous hypertension.  Chronic prominent interstitial markings consistent with chronic lung disease.  Cannot rule out an element of interstitial edema.  Unfolded aorta.   Original Report Authenticated By: Nelson Chimes, M.D.    Scheduled Meds: . albuterol  2.5 mg Nebulization BID  . cefTRIAXone (ROCEPHIN)  IV  1 g Intravenous Q24H  . heparin  5,000 Units Subcutaneous Q8H  . insulin aspart  0-15 Units Subcutaneous TID WC  . insulin aspart  0-5 Units Subcutaneous QHS  . insulin glargine  20 Units Subcutaneous QHS  . ipratropium  0.5 mg Nebulization BID  . metoprolol tartrate  12.5 mg Oral BID  . pantoprazole  40 mg Oral Daily   Continuous Infusions: . sodium chloride 100 mL/hr at 07/25/13 0600    Principal Problem:   Sepsis Active Problems:   DM   Gout, unspecified   HYPERTENSION   CVA   Recurrent boils   Diabetic neuropathy   Obesity   Hemiplegia affecting dominant side, late effect of cerebrovascular disease   GERD (gastroesophageal reflux disease)   Asthma   Cancer of parotid gland   Leukocytosis   Acute on chronic renal failure   Tachycardia   Hypotension   UTI (urinary tract infection)    Time spent: 40 minutes    [facility] Pager [phone]. If 7PM-7AM, please contact night-coverage at [email or web address], password [facility] 07/25/2013, 8:31 AM  LOS: 1 day   Attending note:  Patient seen and independently exam. Above note and amended.  Patient has been admitted with gram-negative sepsis due to urinary source. He is currently on Rocephin. Identifications and sensitivities on cultures is pending and will be followed up. CT of the abdomen and pelvis was  unremarkable. Overnight he has had increasing oxygen requirements. Repeat chest x-ray indicates a possible left lower lobe consolidation. We will add azithromycin to his antibiotic regimen to cover for community-acquired pneumonia. There is also some evidence of volume overload. We will repeat BNP, decrease IV fluids to [facility], give 1 dose of Lasix. We will check 2-D echocardiogram to assess LV function. Patient is somewhat tachycardic today.  We will restart Lopressor. Continue to hold the remainder of antihypertensives at this time. Creatinine has not improved with overnight hydration. Will recheck after a dose of diuretics.  MEMON,JEHANZEB

## 2013-07-25 NOTE — Care Management Note (Signed)
    Page 1 of 2   07/31/2013     1:58:59 PM   CARE MANAGEMENT NOTE 07/31/2013  Patient:  [name], [name]   Account Number:  [id number]  Date Initiated:  07/25/2013  Documentation initiated by:  Theophilus Kinds  Subjective/Objective Assessment:   Pt admitted from home with sepsis. Pt lives with his wife and will return home at discharge. Pt requires [name] moderate amount of assistance with ADL's. Pt has w/c, electric w/c, and cane for home use.     Action/Plan:   Wife is agreeable to [facility] at discharge. Pt is currently active with [facility] for PT/OT for shoulder issues. Will add RN and possible CNA to [facility] at discharge.   Anticipated DC Date:  07/29/2013   Anticipated DC Plan:  [place]  CM consult      [facility] Choice  HOME HEALTH  Resumption Of Svcs/PTA Provider   Choice offered to / List presented to:  C-1 Patient        [place] arranged  HH-1 RN  [place].   Status of service:  Completed, signed off Medicare Important Message given?  YES (If response is "NO", the following Medicare IM given date fields will be blank) Date Medicare IM given:  07/31/2013 Date Additional Medicare IM given:    Discharge Disposition:  [place]  Per UR Regulation:    If discussed at Long Length of Stay Meetings, dates discussed:   07/30/2013    Comments:  07/31/13 [place], RN BSN CM Pt discharged home today with [facility] RN and resumption of [facility] PT/OT. Romualdo Bolk of [facility] is aware and will collect the pts information from the chart. [facility] services to start within 48 hours of discharge. No DME need noted. Pt and pts nurse aware of discharge arrangements.  07/25/13 [place], RN BSN CM

## 2013-07-26 ENCOUNTER — Inpatient Hospital Stay (HOSPITAL_COMMUNITY): Payer: Medicare Other

## 2013-07-26 DIAGNOSIS — J189 Pneumonia, unspecified organism: Secondary | ICD-10-CM

## 2013-07-26 LAB — GLUCOSE, CAPILLARY
Glucose-Capillary: 247 mg/dL — ABNORMAL HIGH (ref 70–99)
Glucose-Capillary: 279 mg/dL — ABNORMAL HIGH (ref 70–99)
Glucose-Capillary: 234 mg/dL — ABNORMAL HIGH (ref 70–99)

## 2013-07-26 LAB — BASIC METABOLIC PANEL
CO2: 25 mEq/L (ref 19–32)
Calcium: 11 mg/dL — ABNORMAL HIGH (ref 8.4–10.5)
Chloride: 103 mEq/L (ref 96–112)
Chloride: 103 mEq/L (ref 96–112)
GFR calc Af Amer: 56 mL/min — ABNORMAL LOW (ref >90)
GFR calc non Af Amer: 48 mL/min — ABNORMAL LOW (ref >90)
Glucose, Bld: 298 mg/dL — ABNORMAL HIGH (ref 70–99)
Potassium: 5.3 mEq/L — ABNORMAL HIGH (ref 3.5–5.1)
Potassium: 3.5 mEq/L (ref 3.5–5.1)
Sodium: 143 mEq/L (ref 135–145)
Sodium: 141 mEq/L (ref 135–145)

## 2013-07-26 LAB — CBC
HCT: 46.8 % (ref 39.0–52.0)
Hemoglobin: 15.4 g/dL (ref 13.0–17.0)
RBC: 6.34 MIL/uL — ABNORMAL HIGH (ref 4.22–5.81)

## 2013-07-26 LAB — LIPASE, BLOOD: Lipase: 15 U/L (ref 11–59)

## 2013-07-26 LAB — URINE CULTURE

## 2013-07-26 MED ORDER — PANTOPRAZOLE SODIUM 40 MG IV SOLR
40.0000 mg | Freq: Two times a day (BID) | INTRAVENOUS | Status: DC
  Administered 2013-07-26 – 2013-07-30 (×9): 40 mg via INTRAVENOUS
  Filled 2013-07-26 (×9): qty 40

## 2013-07-26 MED ORDER — LABETALOL HCL 5 MG/ML IV SOLN
0.5000 mg/min | INTRAVENOUS | Status: DC
  Administered 2013-07-26: 1.5 mg/min via INTRAVENOUS
  Administered 2013-07-26: 1 mg/min via INTRAVENOUS
  Administered 2013-07-26: 0.5 mg/min via INTRAVENOUS
  Administered 2013-07-27: 1 mg/min via INTRAVENOUS
  Filled 2013-07-26 (×2): qty 100

## 2013-07-26 MED ORDER — ONDANSETRON HCL 4 MG/2ML IJ SOLN
4.0000 mg | Freq: Four times a day (QID) | INTRAMUSCULAR | Status: DC
  Administered 2013-07-26 – 2013-07-31 (×19): 4 mg via INTRAVENOUS
  Filled 2013-07-26 (×3): qty 2
  Filled 2013-07-26: qty 4
  Filled 2013-07-26 (×6): qty 2
  Filled 2013-07-26: qty 4
  Filled 2013-07-26 (×8): qty 2

## 2013-07-26 MED ORDER — MILK AND MOLASSES ENEMA
Freq: Once | RECTAL | Status: AC
  Administered 2013-07-26: 21:00:00 via RECTAL

## 2013-07-26 MED ORDER — METOPROLOL TARTRATE 1 MG/ML IV SOLN
5.0000 mg | Freq: Four times a day (QID) | INTRAVENOUS | Status: DC
  Administered 2013-07-26: 5 mg via INTRAVENOUS
  Filled 2013-07-26: qty 5

## 2013-07-26 MED ORDER — LABETALOL HCL 5 MG/ML IV SOLN
INTRAVENOUS | Status: AC
  Filled 2013-07-26: qty 4

## 2013-07-26 MED ORDER — LABETALOL HCL 5 MG/ML IV SOLN
INTRAVENOUS | Status: AC
  Filled 2013-07-26: qty 12

## 2013-07-26 MED ORDER — SODIUM CHLORIDE 0.9 % IV SOLN
INTRAVENOUS | Status: DC
  Administered 2013-07-26: 1000 mL via INTRAVENOUS
  Administered 2013-07-27: 17:00:00 via INTRAVENOUS
  Administered 2013-07-28: 10 mL/h via INTRAVENOUS

## 2013-07-26 MED ORDER — FUROSEMIDE 10 MG/ML IJ SOLN
20.0000 mg | Freq: Two times a day (BID) | INTRAMUSCULAR | Status: DC
  Administered 2013-07-26 (×2): 20 mg via INTRAVENOUS
  Filled 2013-07-26 (×2): qty 2

## 2013-07-26 MED ORDER — CLONIDINE HCL 0.1 MG PO TABS
0.1000 mg | ORAL_TABLET | Freq: Once | ORAL | Status: AC
  Administered 2013-07-26: 0.1 mg via ORAL
  Filled 2013-07-26 (×2): qty 1

## 2013-07-26 NOTE — Progress Notes (Signed)
[facility] PROGRESS NOTE  DUKE WEISENSEL ZTI:[id number] DOB: 04-Sep-1946 DOA: 07/24/2013 PCP: Vic Blackbird, MD  Assessment/Plan: Sepsis: Likely related to UTI. Blood culture +gm neg rods. Continue Rocephin day #3.    Hypoxia: briefly required oxygen supplementation.  Chest xray showed edema and developing pneumonia.  Patient has now been taken off oxygen.  He does appear to be diaphoretic this morning, so will repeat EKG.  CAP.  Patient had evidence of developing pneumonia on chest xray.  Azithromycin added to rocephin.  Continue to monitor.  UTI: See #1. Will continue Rocephin that was started in the emergency department. Urine culture positive for E coli.   Acute on chronic renal failure: Stage 3. Creatinine is improved this morning.  Will gently hydrate and continue IV lasix.    Hypertension: blood pressure uncontrolled.  Having difficulty tolerating oral meds. Will change lopressor to IV for now   Leukocytosis: Likely related to #2 and #4. Will continue antibiotics as indicated above. Will monitor closely.   DM: Hemoglobin A1c 7.2. Patient is on 60 units of Lantus at home will provide one third that dose and use sliding scale for glycemic control. Have down graded diet to clear liquids due to nausea/vomiting.   Nausea/vomiting: Ct abdomen/pelvis on admission without acute abdominal process. Will repeat abdominal xray today. If no signs of ileus/obstruction, may do trial of reglan for suspected gastroparesis.  Change protonix to IV   Gout, unspecified: Stable at baseline. Continue allopurinol.   CVA: Patient wheelchair-bound with right hemiparesis. Appears to be stable at baseline.   Recurrent boils: Stable at baseline  Diabetic neuropathy: Stable at baseline  Obesity: BMI 32.7.  Request nutritional consult when acute phase of illness resolved.   Hemiplegia affecting dominant side, late effect of cerebrovascular disease: We'll request PT when patient over acute phase of  illness   GERD (gastroesophageal reflux disease); stable at baseline continue PPI   Asthma: Patient reports no asthma issues since he was a child however on admission he experienced sudden wheezing that responded positively to nebulizer treatments.See #3. Will monitor his oxygen saturation levels and provide nebs.   Cancer of parotid gland: Stable at baseline    Code Status: DNR Family Communication: son/wife at bedside Disposition Plan: home when ready   Consultants:  none  Procedures:  none  Antibiotics:  Rocephin 07/24/13>>  Azithromycin 8/14>>  HPI/Subjective: Patient has had continued nausea and vomiting yesterday and today.  He complains of diffuse abdominal pain.  He also has had some pain across his chest.  Family reports that they have noted that he has had increased shortness of breath while laying flat, which improves when sitting up.  Last BM was 8/13  Objective: Filed Vitals:   07/26/13 1000  BP: 185/115  Pulse: 109  Temp:   Resp:     Intake/Output Summary (Last 24 hours) at 07/26/13 1143 Last data filed at 07/26/13 1129  Gross per 24 hour  Intake    810 ml  Output   2100 ml  Net  -1290 ml   Filed Weights   07/24/13 1400 07/25/13 0500 07/26/13 0500  Weight: 109 kg (240 lb 4.8 oz) 126.5 kg (278 lb 14.1 oz) 117.8 kg (259 lb 11.2 oz)    Exam:   General:  Alert somewhat ill appearing  Cardiovascular: tachycardic but regular No MGR Trace LE edema  Respiratory: normal effort somewhat shallow. BS distant, crackles at bases  Abdomen: obese slightly firm, diffuse tenderness, sluggish BS  Musculoskeletal:  Right hemiparesis.  Other extremities with spontaneous movement. Fair tone  Data Reviewed: Basic Metabolic Panel:  Recent Labs Lab 07/24/13 0854 07/25/13 0511 07/26/13 0524  NA 137 137 143  K 4.3 3.9 5.3*  CL 99 102 103  CO2 19 24 26   GLUCOSE 158* 178* 223*  BUN 40* 42* 35*  CREATININE 1.99* 1.94* 1.46*  CALCIUM 11.1* 10.5 11.3*    Liver Function Tests:  Recent Labs Lab 07/24/13 1211  AST 20  ALT 13  ALKPHOS 85  BILITOT 0.5  PROT 7.1  ALBUMIN 2.9*   No results found for this basename: LIPASE, AMYLASE,  in the last 168 hours No results found for this basename: AMMONIA,  in the last 168 hours CBC:  Recent Labs Lab 07/24/13 0854 07/25/13 0511 07/26/13 0524  WBC 14.5* 14.7* 14.8*  NEUTROABS 13.1*  --   --   HGB 15.1 13.4 15.4  HCT 45.0 40.7 46.8  MCV 74.0* 74.0* 73.8*  PLT 181 196 197   Cardiac Enzymes:  Recent Labs Lab 07/24/13 0854  TROPONINI <0.30   BNP (last 3 results)  Recent Labs  07/24/13 0854 07/25/13 0511  PROBNP 814.2* 1507.0*   CBG:  Recent Labs Lab 07/25/13 0722 07/25/13 1106 07/25/13 1614 07/25/13 2101 07/26/13 0727  GLUCAP 171* 192* 210* 205* 235*    Recent Results (from the past 240 hour(s))  URINE CULTURE     Status: None   Collection Time    07/24/13  9:20 AM      Result Value Range Status   Specimen Description URINE, CATHETERIZED   Final   Special Requests NONE   Final   Culture  Setup Time     Final   Value: 07/24/2013 21:04     Performed at [facility]     Final   Value: >=100,000 COLONIES/ML     Performed at [facility]   Culture     Final   Value: ESCHERICHIA COLI     Performed at [facility]   Report Status 07/26/2013 FINAL   Final   Organism ID, Bacteria ESCHERICHIA COLI   Final  CULTURE, BLOOD (ROUTINE X 2)     Status: None   Collection Time    07/24/13  9:55 AM      Result Value Range Status   Specimen Description BLOOD LEFT HAND   Final   Special Requests BOTTLES DRAWN AEROBIC AND ANAEROBIC 8CC   Final   Culture  Setup Time     Final   Value: 07/25/2013 13:50     Performed at [facility]   Culture     Final   Value: ESCHERICHIA COLI     Note: Gram Stain Report Called to,Read Back By and Verified With: DOROTHY BREWER RN ON 07/26/2013 AT 1:05A BY WILEJ     Performed at [facility]   Report Status PENDING   Incomplete  MRSA PCR SCREENING     Status: None   Collection Time    07/24/13 12:10 PM      Result Value Range Status   MRSA by PCR NEGATIVE  NEGATIVE Final   Comment:            The GeneXpert MRSA Assay (FDA     approved for NASAL specimens     only), is one component of a     comprehensive MRSA colonization     surveillance program. It is not     intended to diagnose  MRSA     infection nor to guide or     monitor treatment for     MRSA infections.  CULTURE, BLOOD (ROUTINE X 2)     Status: None   Collection Time    07/24/13 12:11 PM      Result Value Range Status   Specimen Description BLOOD LEFT HAND   Final   Special Requests BOTTLES DRAWN AEROBIC AND ANAEROBIC 8CC   Final   Culture NO GROWTH 1 DAY   Final   Report Status PENDING   Incomplete     Studies: Ct Abdomen Pelvis Wo Contrast  07/24/2013   *RADIOLOGY REPORT*  Clinical Data: Abdominal pain, cough, chills.  Chronic renal insufficiency, hypertension.  CT ABDOMEN AND PELVIS WITHOUT CONTRAST  Technique:  Multidetector CT imaging of the abdomen and pelvis was performed following the standard protocol without intravenous contrast.  Comparison: 08/11/2012  Findings: Chronic pleural thickening posteriorly at the left lung base. Surgical clips in the gallbladder fossa.  Unremarkable uninfused evaluation of liver, spleen, adrenal glands, pancreas.  Stable low- attenuation renal lesions corresponding to a cyst previously identified.  No hydronephrosis.  Streaky inflammatory/edematous changes around bilateral kidneys, more conspicuous than on prior exam.  No hydronephrosis or ureterectasis.  No nephrolithiasis. Patchy aortoiliac arterial calcifications without aneurysm. Stomach, small bowel, and colon are nondilated.  Urinary bladder incompletely distended.  Moderate prostatic enlargement.  The stable coarse right pelvic vascular calcifications.  No ascites. No free air.  No adenopathy.  Scattered  ascending, descending and descending colon diverticula without significant adjacent inflammatory/edematous change.  Coarse sclerotic impairment of appearance of the L3 vertebral body with compression deformity involving the inferior endplates centrally without significant loss of height, stable since previous exam.  IMPRESSION:  1.  No acute abdominal process. 2.  Ascending and descending colonic diverticulosis as before.   Original Report Authenticated By: D. Wallace Going, MD   Dg Chest Port 1 View  07/25/2013   *RADIOLOGY REPORT*  Clinical Data: Hypoxia  PORTABLE CHEST - 1 VIEW  Comparison: July 24, 2013  Findings: There is a new area of consolidation in the left base. Elsewhere, there is mild interstitial prominence which is stable but no consolidation.  Heart is upper normal in size.  The pulmonary vascularity appears consistent with a degree of pulmonary venous hypertension, stable.  No adenopathy.  Aorta is mildly prominent but stable.  No pneumothorax.  IMPRESSION: New small area of consolidation left base.  Question a degree of underlying volume overload.  Prominence of the aorta may reflect chronic hypertensive change.   Original Report Authenticated By: Lowella Grip, M.D.    Scheduled Meds: . albuterol  2.5 mg Nebulization BID  . azithromycin  500 mg Intravenous Q24H  . cefTRIAXone (ROCEPHIN)  IV  1 g Intravenous Q24H  . febuxostat  40 mg Oral Daily  . furosemide  20 mg Intravenous BID  . heparin  5,000 Units Subcutaneous Q8H  . insulin aspart  0-15 Units Subcutaneous TID WC  . insulin aspart  0-5 Units Subcutaneous QHS  . insulin glargine  25 Units Subcutaneous QHS  . ipratropium  0.5 mg Nebulization BID  . metoprolol  5 mg Intravenous Q6H  . ondansetron (ZOFRAN) IV  4 mg Intravenous Q6H  . pantoprazole (PROTONIX) IV  40 mg Intravenous Q12H   Continuous Infusions: . sodium chloride 20 mL/hr at 07/26/13 1000  . sodium chloride 50 mL/hr at 07/26/13 1130    Principal Problem:    Sepsis Active Problems:  DM   Gout, unspecified   HYPERTENSION   CVA   Recurrent boils   Diabetic neuropathy   Obesity   Hemiplegia affecting dominant side, late effect of cerebrovascular disease   GERD (gastroesophageal reflux disease)   Asthma   Cancer of parotid gland   Leukocytosis   Acute on chronic renal failure   Tachycardia   Hypotension   UTI (urinary tract infection)   Bacteremia   CAP (community acquired pneumonia)    Time spent: 86 minutes    [place] [facility] Pager [phone]. If 7PM-7AM, please contact night-coverage at [email or web address], password [facility] 07/26/2013, 11:43 AM  LOS: 2 days

## 2013-07-26 NOTE — Progress Notes (Signed)
CRITICAL VALUE ALE Critical value received:  Gram neg rods both aerobic & anaerobic bottles  Date of notification:  07/26/13  Time of notification:  0110  Critical value read back:yes  Nurse who received alert:  Hardin Negus rn  MD notified (1st page):  Not needed, gram neg rods already reported today for blood cultures  Time of first page:    MD notified (2nd page):  Time of second page:  Responding MD:    Time MD responded:

## 2013-07-26 NOTE — Plan of Care (Signed)
Problem: Phase I Progression Outcomes Goal: Vital Signs stable- temperature less than 102 Outcome: Not Progressing Patient remains hypertensive after notification and multiple therapys attempted. Goal: Hemodynamically stable Outcome: Not Progressing Remains hypertensive

## 2013-07-27 DIAGNOSIS — R1011 Right upper quadrant pain: Secondary | ICD-10-CM

## 2013-07-27 DIAGNOSIS — I10 Essential (primary) hypertension: Secondary | ICD-10-CM

## 2013-07-27 LAB — GLUCOSE, CAPILLARY: Glucose-Capillary: 257 mg/dL — ABNORMAL HIGH (ref 70–99)

## 2013-07-27 LAB — BASIC METABOLIC PANEL
Calcium: 10.7 mg/dL — ABNORMAL HIGH (ref 8.4–10.5)
GFR calc Af Amer: 49 mL/min — ABNORMAL LOW (ref >90)
GFR calc non Af Amer: 43 mL/min — ABNORMAL LOW (ref >90)
Glucose, Bld: 278 mg/dL — ABNORMAL HIGH (ref 70–99)
Potassium: 3.6 mEq/L (ref 3.5–5.1)
Sodium: 142 mEq/L (ref 135–145)

## 2013-07-27 LAB — CBC
Hemoglobin: 14.3 g/dL (ref 13.0–17.0)
MCH: 23.9 pg — ABNORMAL LOW (ref 26.0–34.0)
MCHC: 32.6 g/dL (ref 30.0–36.0)
Platelets: 220 10*3/uL (ref 150–400)
RBC: 5.98 MIL/uL — ABNORMAL HIGH (ref 4.22–5.81)

## 2013-07-27 LAB — CULTURE, BLOOD (ROUTINE X 2)

## 2013-07-27 MED ORDER — AMLODIPINE BESYLATE 5 MG PO TABS
10.0000 mg | ORAL_TABLET | Freq: Every day | ORAL | Status: DC
  Administered 2013-07-27 – 2013-07-31 (×5): 10 mg via ORAL
  Filled 2013-07-27 (×6): qty 2

## 2013-07-27 MED ORDER — ALUM & MAG HYDROXIDE-SIMETH 200-200-20 MG/5ML PO SUSP
30.0000 mL | ORAL | Status: DC | PRN
  Administered 2013-07-27 – 2013-07-29 (×6): 30 mL via ORAL
  Filled 2013-07-27 (×9): qty 30

## 2013-07-27 MED ORDER — LABETALOL HCL 5 MG/ML IV SOLN
INTRAVENOUS | Status: AC
  Filled 2013-07-27: qty 12

## 2013-07-27 NOTE — Progress Notes (Signed)
Pt c/o of heartburn.  Last had Maalox at 1530.  Maalox is every six hours prn.  Dr. [place] Pink notified via text page.

## 2013-07-27 NOTE — Progress Notes (Addendum)
[name] UEA:[id number] DOB: 1946-11-07 DOA: 07/24/2013 PCP: Vic Blackbird, MD   Subjective: This man is somewhat better today, does not have any chills or fever. He does not have a cough.           Physical Exam: Blood pressure 148/93, pulse 82, temperature 98.2 F (36.8 C), temperature source Oral, resp. rate 22, height 6' (1.829 m), weight 120.43 kg (265 lb 8 oz), SpO2 95.00%. He looks systemically well. He no longer looks toxic or septic. Heart sounds are present without murmurs or added sounds. He appears to be in sinus rhythm. Lung fields are clinically clear. Abdomen is soft and subjectively diffusely tender. He is alert and orientated. He has right sided weakness secondary to his previous CVA.   Investigations:  Recent Results (from the past 240 hour(s))  URINE CULTURE     Status: None   Collection Time    07/24/13  9:20 AM      Result Value Range Status   Specimen Description URINE, CATHETERIZED   Final   Special Requests NONE   Final   Culture  Setup Time     Final   Value: 07/24/2013 21:04     Performed at [facility]     Final   Value: >=100,000 COLONIES/ML     Performed at [facility]   Culture     Final   Value: ESCHERICHIA COLI     Performed at [facility]   Report Status 07/26/2013 FINAL   Final   Organism ID, Bacteria ESCHERICHIA COLI   Final  CULTURE, BLOOD (ROUTINE X 2)     Status: None   Collection Time    07/24/13  9:55 AM      Result Value Range Status   Specimen Description BLOOD LEFT HAND   Final   Special Requests BOTTLES DRAWN AEROBIC AND ANAEROBIC 8CC   Final   Culture  Setup Time     Final   Value: 07/25/2013 13:50     Performed at [facility]   Culture     Final   Value: ESCHERICHIA COLI     Note: Gram Stain Report Called to,Read Back By and Verified With: DOROTHY BREWER RN ON 07/26/2013 AT 1:05A BY WILEJ     Performed at [facility]   Report Status PENDING    Incomplete  MRSA PCR SCREENING     Status: None   Collection Time    07/24/13 12:10 PM      Result Value Range Status   MRSA by PCR NEGATIVE  NEGATIVE Final   Comment:            The GeneXpert MRSA Assay (FDA     approved for NASAL specimens     only), is one component of a     comprehensive MRSA colonization     surveillance program. It is not     intended to diagnose MRSA     infection nor to guide or     monitor treatment for     MRSA infections.  CULTURE, BLOOD (ROUTINE X 2)     Status: None   Collection Time    07/24/13 12:11 PM      Result Value Range Status   Specimen Description BLOOD LEFT HAND   Final   Special Requests BOTTLES DRAWN AEROBIC AND ANAEROBIC 8CC   Final   Culture NO GROWTH 2 DAYS   Final   Report Status PENDING  Incomplete     Basic Metabolic Panel:  Recent Labs  07/26/13 1435 07/27/13 0524  NA 141 142  K 3.5 3.6  CL 103 105  CO2 25 26  GLUCOSE 298* 278*  BUN 36* 45*  CREATININE 1.34 1.62*  CALCIUM 11.0* 10.7*   Liver Function Tests:  Recent Labs  07/24/13 1211  AST 20  ALT 13  ALKPHOS 85  BILITOT 0.5  PROT 7.1  ALBUMIN 2.9*     CBC:  Recent Labs  07/24/13 0854  07/26/13 0524 07/27/13 0524  WBC 14.5*  < > 14.8* 14.5*  NEUTROABS 13.1*  --   --   --   HGB 15.1  < > 15.4 14.3  HCT 45.0  < > 46.8 43.9  MCV 74.0*  < > 73.8* 73.4*  PLT 181  < > 197 220  < > = values in this interval not displayed.  Dg Chest Port 1 View  07/25/2013   *RADIOLOGY REPORT*  Clinical Data: Hypoxia  PORTABLE CHEST - 1 VIEW  Comparison: July 24, 2013  Findings: There is a new area of consolidation in the left base. Elsewhere, there is mild interstitial prominence which is stable but no consolidation.  Heart is upper normal in size.  The pulmonary vascularity appears consistent with a degree of pulmonary venous hypertension, stable.  No adenopathy.  Aorta is mildly prominent but stable.  No pneumothorax.  IMPRESSION: New small area of consolidation  left base.  Question a degree of underlying volume overload.  Prominence of the aorta may reflect chronic hypertensive change.   Original Report Authenticated By: Lowella Grip, M.D.   Dg Abd Portable 1v  07/26/2013   *RADIOLOGY REPORT*  Clinical Data: Vomiting and fever.  PORTABLE ABDOMEN - 1 VIEW  Comparison: 07/24/2013 CT.  Findings: Interval development of gas filled slightly prominent size small bowel loops measuring up to 3.3 cm.  Gas and partially stool filled colon.  Appearance may be related to an ileus.  The possibility of free intraperitoneal air cannot be addressed on a supine view.  Post cholecystectomy.  IMPRESSION: Interval development of gas filled slightly prominent size small bowel loops measuring up to 3.3 cm.  Gas and partially stool filled colon.  Appearance may be related to an ileus.   Original Report Authenticated By: Genia Del, M.D.      Medications: I have reviewed the patient's current medications.  Impression: 1. Sepsis secondary to Escherichia coli UTI. 2. Escherichia coli UTI. 3. Possible community-acquired pneumonia on chest x-ray. 4. Acute on chronic renal failure, slightly worse today. Patient has been on IV Lasix. 5. Uncontrolled hypertension, much improved. 6. Diabetes mellitus. 7. Nausea and vomiting with possible ileus. 8. Cerebrovascular disease with old CVA affecting the right side. 9. Cancer of the parotid gland, stable at baseline. 10. Hypercalcemia of unknown etiology. Stable.     Plan: 1. Discontinue IV Lasix. 2. Continue intravenous Rocephin and Zithromax. Await sensitivities on urine culture. 3. Restart antihypertensive medications at home, Norvasc 10 mg daily. Discontinue IV labetalol drip. 4. Keep in step down unit today, moved to regular floor tomorrow if continues to be stable  Consultants:  None.   Procedures:  None.   Antibiotics:  Intravenous Rocephin started 07/24/2013.  Intravenous azithromycin started  07/24/2013.                   Code Status: DO NOT RESUSCITATE.  Family Communication: Discussed plan with patient and son at the bedside.   Disposition Plan: Home  when medically stable.  Time spent: 15 minutes.   LOS: 3 days   Doree Albee Pager [phone]  07/27/2013, 8:15 AM

## 2013-07-28 ENCOUNTER — Inpatient Hospital Stay (HOSPITAL_COMMUNITY): Payer: Medicare Other

## 2013-07-28 DIAGNOSIS — E119 Type 2 diabetes mellitus without complications: Secondary | ICD-10-CM

## 2013-07-28 LAB — COMPREHENSIVE METABOLIC PANEL
ALT: 18 U/L (ref 0–53)
AST: 16 U/L (ref 0–37)
Alkaline Phosphatase: 90 U/L (ref 39–117)
CO2: 27 mEq/L (ref 19–32)
Chloride: 107 mEq/L (ref 96–112)
GFR calc non Af Amer: 54 mL/min — ABNORMAL LOW (ref >90)
Glucose, Bld: 203 mg/dL — ABNORMAL HIGH (ref 70–99)
Sodium: 144 mEq/L (ref 135–145)
Total Bilirubin: 0.3 mg/dL (ref 0.3–1.2)

## 2013-07-28 LAB — GLUCOSE, CAPILLARY: Glucose-Capillary: 226 mg/dL — ABNORMAL HIGH (ref 70–99)

## 2013-07-28 LAB — CBC
Hemoglobin: 14.5 g/dL (ref 13.0–17.0)
Platelets: 212 10*3/uL (ref 150–400)
RBC: 6.21 MIL/uL — ABNORMAL HIGH (ref 4.22–5.81)
WBC: 12.6 10*3/uL — ABNORMAL HIGH (ref 4.0–10.5)

## 2013-07-28 MED ORDER — METOCLOPRAMIDE HCL 5 MG/ML IJ SOLN
5.0000 mg | Freq: Four times a day (QID) | INTRAMUSCULAR | Status: DC
  Administered 2013-07-28 – 2013-07-30 (×9): 5 mg via INTRAVENOUS
  Filled 2013-07-28 (×2): qty 2
  Filled 2013-07-28: qty 4
  Filled 2013-07-28: qty 2
  Filled 2013-07-28: qty 4
  Filled 2013-07-28 (×4): qty 2

## 2013-07-28 MED ORDER — BISACODYL 5 MG PO TBEC
10.0000 mg | DELAYED_RELEASE_TABLET | Freq: Two times a day (BID) | ORAL | Status: DC
  Administered 2013-07-28 – 2013-07-31 (×7): 10 mg via ORAL
  Filled 2013-07-28 (×7): qty 2

## 2013-07-28 MED ORDER — METOPROLOL TARTRATE 50 MG PO TABS
50.0000 mg | ORAL_TABLET | Freq: Four times a day (QID) | ORAL | Status: DC
  Administered 2013-07-28 – 2013-07-29 (×5): 50 mg via ORAL
  Filled 2013-07-28 (×5): qty 1

## 2013-07-28 MED ORDER — METOPROLOL TARTRATE 50 MG PO TABS
50.0000 mg | ORAL_TABLET | Freq: Two times a day (BID) | ORAL | Status: DC
  Administered 2013-07-28: 50 mg via ORAL
  Filled 2013-07-28 (×2): qty 1

## 2013-07-28 NOTE — Progress Notes (Signed)
1350 - Pt's BP and heart rate continues to be elevated after receiving Lopressor 50 mg po.  Dr. Roderic Palau notified via text page.  Dr. Roderic Palau responded and gave order to continue to monitor and give the Lopressor more time.  Orders followed.

## 2013-07-28 NOTE — Progress Notes (Signed)
Patient's BP 168/102 and heart rate 88.  Patient received Metoprolol at 1745 and Hydralazine at 1637.  Dr. Roderic Palau notified via text page.

## 2013-07-28 NOTE — Plan of Care (Signed)
Problem: Phase I Progression Outcomes Goal: OOB as tolerated unless otherwise ordered Outcome: Progressing Patient up to chair for approximately one hour this afternoon.  Tolerated well.

## 2013-07-28 NOTE — Progress Notes (Signed)
4 - Dr. Roderic Palau returned page.  No new orders at this time.  Continue to monitor.

## 2013-07-28 NOTE — Progress Notes (Signed)
[facility] PROGRESS NOTE  [name] QQI:[id number] DOB: March 16, 1946 DOA: 07/24/2013 PCP: Vic Blackbird, MD  Assessment/Plan: Sepsis: Likely related to UTI. Blood culture +gm neg rods. Continue Rocephin day #5.    Acute respiratory failure: requiring oxygen supplementation.  Chest xray showed edema and developing pneumonia.  Patient has now been taken off oxygen.  Repeat chest xray today.  CAP.  Patient had evidence of developing pneumonia on chest xray.  Azithromycin added to rocephin.  Continue to monitor.  UTI: See #1. Will continue Rocephin that was started in the emergency department. Urine culture positive for E coli.   Acute on chronic renal failure: Stage 3. Creatinine is improved this morning.  Appears euvolemic.  Discontinue iv fluids.    Hypertension: blood pressure uncontrolled.  Having difficulty tolerating oral meds. Briefly required labetalol infusion, but this was discontinued yesterday.  Patient is very hypertensive again today.  Will increase lopressor and monitor.  Ileus.  Abdominal xray shows ileus.  Didn't have significant response to enema.  Will give trial of reglan and laxatives   Leukocytosis: Likely related to #2 and #4. Will continue antibiotics as indicated above. Will monitor closely. Improving.   DM: Hemoglobin A1c 7.2. Patient is on 60 units of Lantus at home will provide one third that dose and use sliding scale for glycemic control. Have down graded diet to clear liquids due to nausea/vomiting.    Gout, unspecified: Stable at baseline. Continue allopurinol.   CVA: Patient wheelchair-bound with right hemiparesis. Appears to be stable at baseline.   Recurrent boils: Stable at baseline  Diabetic neuropathy: Stable at baseline  Obesity: BMI 32.7.  Request nutritional consult when acute phase of illness resolved.   Hemiplegia affecting dominant side, late effect of cerebrovascular disease: We'll request PT when patient over acute phase of illness    GERD (gastroesophageal reflux disease); stable at baseline continue PPI   Asthma: Patient reports no asthma issues since he was a child however on admission he experienced sudden wheezing that responded positively to nebulizer treatments.See #3. Will monitor his oxygen saturation levels and provide nebs.   Cancer of parotid gland: Stable at baseline    Code Status: DNR Family Communication: son/wife at bedside Disposition Plan: home when ready   Consultants:  none  Procedures:  none  Antibiotics:  Rocephin 07/24/13>>  Azithromycin 8/14>>  HPI/Subjective: Nausea and vomiting are improving today.  He did eat some breakfast last night.  No bowel movement.  Feels breathing is getting better.   Objective: Filed Vitals:   07/28/13 1048  BP: 175/122  Pulse:   Temp:   Resp:     Intake/Output Summary (Last 24 hours) at 07/28/13 1152 Last data filed at 07/28/13 0830  Gross per 24 hour  Intake   1980 ml  Output    870 ml  Net   1110 ml   Filed Weights   07/26/13 0500 07/27/13 0449 07/28/13 0500  Weight: 117.8 kg (259 lb 11.2 oz) 120.43 kg (265 lb 8 oz) 122.6 kg (270 lb 4.5 oz)    Exam:   General:  Alert somewhat ill appearing  Cardiovascular: tachycardic but regular No MGR Trace LE edema  Respiratory: normal effort somewhat shallow. BS distant, crackles at bases  Abdomen: obese diffuse mild tenderness, sluggish BS  Musculoskeletal:  Right hemiparesis. Other extremities with spontaneous movement. Fair tone  Data Reviewed: Basic Metabolic Panel:  Recent Labs Lab 07/25/13 0511 07/26/13 0524 07/26/13 1435 07/27/13 0524 07/28/13 0512  NA 137 143 141 142  144  K 3.9 5.3* 3.5 3.6 3.3*  CL 102 103 103 105 107  CO2 24 26 25 26 27   GLUCOSE 178* 223* 298* 278* 203*  BUN 42* 35* 36* 45* 36*  CREATININE 1.94* 1.46* 1.34 1.62* 1.33  CALCIUM 10.5 11.3* 11.0* 10.7* 10.3   Liver Function Tests:  Recent Labs Lab 07/24/13 1211 07/28/13 0512  AST 20 16  ALT  13 18  ALKPHOS 85 90  BILITOT 0.5 0.3  PROT 7.1 7.3  ALBUMIN 2.9* 2.6*    Recent Labs Lab 07/26/13 1141  LIPASE 15   No results found for this basename: AMMONIA,  in the last 168 hours CBC:  Recent Labs Lab 07/24/13 0854 07/25/13 0511 07/26/13 0524 07/27/13 0524 07/28/13 0512  WBC 14.5* 14.7* 14.8* 14.5* 12.6*  NEUTROABS 13.1*  --   --   --   --   HGB 15.1 13.4 15.4 14.3 14.5  HCT 45.0 40.7 46.8 43.9 45.8  MCV 74.0* 74.0* 73.8* 73.4* 73.8*  PLT 181 196 197 220 212   Cardiac Enzymes:  Recent Labs Lab 07/24/13 0854  TROPONINI <0.30   BNP (last 3 results)  Recent Labs  07/24/13 0854 07/25/13 0511  PROBNP 814.2* 1507.0*   CBG:  Recent Labs Lab 07/27/13 0749 07/27/13 1201 07/27/13 1701 07/27/13 2131 07/28/13 0748  GLUCAP 249* 257* 206* 166* 194*    Recent Results (from the past 240 hour(s))  URINE CULTURE     Status: None   Collection Time    07/24/13  9:20 AM      Result Value Range Status   Specimen Description URINE, CATHETERIZED   Final   Special Requests NONE   Final   Culture  Setup Time     Final   Value: 07/24/2013 21:04     Performed at [facility]     Final   Value: >=100,000 COLONIES/ML     Performed at [facility]   Culture     Final   Value: ESCHERICHIA COLI     Performed at [facility]   Report Status 07/26/2013 FINAL   Final   Organism ID, Bacteria ESCHERICHIA COLI   Final  CULTURE, BLOOD (ROUTINE X 2)     Status: None   Collection Time    07/24/13  9:55 AM      Result Value Range Status   Specimen Description BLOOD LEFT HAND   Final   Special Requests BOTTLES DRAWN AEROBIC AND ANAEROBIC 8CC   Final   Culture  Setup Time     Final   Value: 07/25/2013 13:50     Performed at [facility]   Culture     Final   Value: ESCHERICHIA COLI     Note: Gram Stain Report Called to,Read Back By and Verified With: DOROTHY BREWER RN ON 07/26/2013 AT 1:05A BY Dennard Nip     Performed at [facility]   Report Status 07/27/2013 FINAL   Final   Organism ID, Bacteria ESCHERICHIA COLI   Final  MRSA PCR SCREENING     Status: None   Collection Time    07/24/13 12:10 PM      Result Value Range Status   MRSA by PCR NEGATIVE  NEGATIVE Final   Comment:            The GeneXpert MRSA Assay (FDA     approved for NASAL specimens     only), is one component of a  comprehensive MRSA colonization     surveillance program. It is not     intended to diagnose MRSA     infection nor to guide or     monitor treatment for     MRSA infections.  CULTURE, BLOOD (ROUTINE X 2)     Status: None   Collection Time    07/24/13 12:11 PM      Result Value Range Status   Specimen Description BLOOD LEFT HAND   Final   Special Requests BOTTLES DRAWN AEROBIC AND ANAEROBIC 8CC   Final   Culture NO GROWTH 4 DAYS   Final   Report Status PENDING   Incomplete     Studies: Dg Abd Portable 1v  07/26/2013   *RADIOLOGY REPORT*  Clinical Data: Vomiting and fever.  PORTABLE ABDOMEN - 1 VIEW  Comparison: 07/24/2013 CT.  Findings: Interval development of gas filled slightly prominent size small bowel loops measuring up to 3.3 cm.  Gas and partially stool filled colon.  Appearance may be related to an ileus.  The possibility of free intraperitoneal air cannot be addressed on a supine view.  Post cholecystectomy.  IMPRESSION: Interval development of gas filled slightly prominent size small bowel loops measuring up to 3.3 cm.  Gas and partially stool filled colon.  Appearance may be related to an ileus.   Original Report Authenticated By: Genia Del, M.D.    Scheduled Meds: . albuterol  2.5 mg Nebulization BID  . amLODipine  10 mg Oral Daily  . azithromycin  500 mg Intravenous Q24H  . cefTRIAXone (ROCEPHIN)  IV  1 g Intravenous Q24H  . febuxostat  40 mg Oral Daily  . heparin  5,000 Units Subcutaneous Q8H  . insulin aspart  0-15 Units Subcutaneous TID WC  . insulin aspart  0-5 Units Subcutaneous QHS  .  insulin glargine  25 Units Subcutaneous QHS  . ipratropium  0.5 mg Nebulization BID  . metoprolol tartrate  50 mg Oral Q6H  . ondansetron (ZOFRAN) IV  4 mg Intravenous Q6H  . pantoprazole (PROTONIX) IV  40 mg Intravenous Q12H   Continuous Infusions: . sodium chloride 20 mL/hr at 07/26/13 1000  . sodium chloride 50 mL/hr at 07/27/13 1651    Principal Problem:   Sepsis Active Problems:   DM   Gout, unspecified   HYPERTENSION   CVA   Recurrent boils   Diabetic neuropathy   Obesity   Hemiplegia affecting dominant side, late effect of cerebrovascular disease   GERD (gastroesophageal reflux disease)   Asthma   Cancer of parotid gland   Leukocytosis   Acute on chronic renal failure   Tachycardia   Hypotension   UTI (urinary tract infection)   Bacteremia   CAP (community acquired pneumonia)    Time spent: 76 minutes    [facility] Pager [phone]. If 7PM-7AM, please contact night-coverage at [email or web address], password [facility] 07/28/2013, 11:52 AM  LOS: 4 days

## 2013-07-28 NOTE — Progress Notes (Signed)
Blood pressure remains elevated after Hydralazine IV. Notified MD. Buddy Duty on lopressor 50 mg BID.

## 2013-07-29 LAB — BASIC METABOLIC PANEL
Calcium: 10.8 mg/dL — ABNORMAL HIGH (ref 8.4–10.5)
Creatinine, Ser: 1.19 mg/dL (ref 0.50–1.35)
GFR calc Af Amer: 72 mL/min — ABNORMAL LOW (ref >90)
Sodium: 143 mEq/L (ref 135–145)

## 2013-07-29 LAB — CULTURE, BLOOD (ROUTINE X 2)

## 2013-07-29 LAB — CBC
Platelets: 262 10*3/uL (ref 150–400)
RBC: 6.66 MIL/uL — ABNORMAL HIGH (ref 4.22–5.81)
RDW: 17 % — ABNORMAL HIGH (ref 11.5–15.5)
WBC: 14.6 10*3/uL — ABNORMAL HIGH (ref 4.0–10.5)

## 2013-07-29 LAB — GLUCOSE, CAPILLARY: Glucose-Capillary: 264 mg/dL — ABNORMAL HIGH (ref 70–99)

## 2013-07-29 MED ORDER — DEXTROSE 5 % IV SOLN
2.0000 g | INTRAVENOUS | Status: DC
  Administered 2013-07-30: 2 g via INTRAVENOUS
  Filled 2013-07-29 (×2): qty 2

## 2013-07-29 MED ORDER — SUCRALFATE 1 GM/10ML PO SUSP
1.0000 g | Freq: Three times a day (TID) | ORAL | Status: DC
  Administered 2013-07-29 – 2013-07-31 (×8): 1 g via ORAL
  Filled 2013-07-29 (×8): qty 10

## 2013-07-29 MED ORDER — METOPROLOL TARTRATE 50 MG PO TABS
50.0000 mg | ORAL_TABLET | Freq: Two times a day (BID) | ORAL | Status: DC
  Administered 2013-07-29 – 2013-07-31 (×4): 50 mg via ORAL
  Filled 2013-07-29 (×4): qty 1

## 2013-07-29 MED ORDER — CLONIDINE HCL 0.2 MG PO TABS
0.2000 mg | ORAL_TABLET | Freq: Three times a day (TID) | ORAL | Status: DC
  Administered 2013-07-29 – 2013-07-31 (×7): 0.2 mg via ORAL
  Filled 2013-07-29 (×7): qty 1

## 2013-07-29 NOTE — Progress Notes (Addendum)
[facility] PROGRESS NOTE  [name] ZSM:[id number] DOB: 03/06/1946 DOA: 07/24/2013 PCP: Vic Blackbird, MD   Brief Narrative:  This patient was admitted to the hospital with fever, chills and shortness of breath.  He was found to have UTI and sepsis.  He was started on rocephin.  Urine and blood cultures have grown E coli.  He is now improved and is afebrile.  He did have some acute renal failure which has since improved with IV fluids.  Chest xray indicated possible developing pneumonia, so azithromycin was added to regimen. He began having significant nausea and vomiting and was unable to tolerate po meds.  He became severely hypertensive and briefly required labetalol infusion.  This has since been discontinued and he has been started back on home dose of clonidine. Patient has significantly improved.  If blood pressure remains stable and nausea/vomiting resolves, I anticipate he should be able to discharge home in the next 24-48 hours.  Assessment/Plan: Sepsis: Likely related to UTI. Blood culture E coli, likely from urine. Continue Rocephin day #6.  Will need a total of 14 days of therapy.   Acute respiratory failure: requiring oxygen supplementation.  Chest xray showed edema and developing pneumonia.  Patient has now been taken off oxygen.  Repeat chest xray shows improvement.  CAP.  Patient had evidence of developing pneumonia on chest xray.  Azithromycin added to rocephin.  Continue to monitor. Hypoxia has resolved.  UTI: See #1. Will continue Rocephin that was started in the emergency department. Urine culture positive for E coli.   Acute on chronic renal failure: Stage 3. Creatinine is improved this morning.  Appears euvolemic.  Discontinue iv fluids.    Hypertension: blood pressure uncontrolled.  Start home dose of clonidine, now that nausea and vomiting have improved.  Will likely need to decrease lopressor dosing.  Continue to follow.  Ileus.  Repeat abdominal xray  shows improvement.  Continue reglan and laxatives.  Leukocytosis: etiology is not entire clear.  He is no longer having any fevers.  He does not appear to have an untreated infection.  Will continue to monitor.   DM: Hemoglobin A1c 7.2. Patient is on 60 units of Lantus at home will provide one third that dose and use sliding scale for glycemic control. Have down graded diet to clear liquids due to nausea/vomiting.    Gout, unspecified: Stable at baseline. Continue allopurinol.   CVA: Patient wheelchair-bound with right hemiparesis. Appears to be stable at baseline.   Recurrent boils: Stable at baseline  Diabetic neuropathy: Stable at baseline  Obesity: BMI 32.7.  Request nutritional consult when acute phase of illness resolved.   GERD (gastroesophageal reflux disease); continue PPI bid, add carafate   Asthma: Patient reports no asthma issues since he was a child however on admission he experienced sudden wheezing that responded positively to nebulizer treatments.See #3. Will monitor his oxygen saturation levels and provide nebs.   Cancer of parotid gland: Stable at baseline    Code Status: DNR Family Communication: son/wife at bedside Disposition Plan: home when ready, physical therapy to see, anticipate discharge in the next 24-48 hours.   Consultants:  none  Procedures:  none  Antibiotics:  Rocephin 07/24/13>>  Azithromycin 8/14>>  HPI/Subjective: Complaining of persistent indigestion, worsened after eating, does not have an appetite.  Breathing is improved.  He is coughing.    Objective: Filed Vitals:   07/29/13 1200  BP: 138/93  Pulse:   Temp:   Resp:     Intake/Output  Summary (Last 24 hours) at 07/29/13 1213 Last data filed at 07/29/13 1146  Gross per 24 hour  Intake 563.17 ml  Output   1750 ml  Net -1186.83 ml   Filed Weights   07/27/13 0449 07/28/13 0500 07/29/13 0647  Weight: 120.43 kg (265 lb 8 oz) 122.6 kg (270 lb 4.5 oz) 122.789 kg (270 lb 11.2  oz)    Exam:   General:  Alert somewhat ill appearing  Cardiovascular: tachycardic but regular No MGR Trace LE edema  Respiratory: normal effort somewhat shallow. BS distant, crackles at bases  Abdomen: obese diffuse mild tenderness, sluggish BS  Musculoskeletal:  Right hemiparesis. Other extremities with spontaneous movement. Fair tone  Data Reviewed: Basic Metabolic Panel:  Recent Labs Lab 07/26/13 0524 07/26/13 1435 07/27/13 0524 07/28/13 0512 07/29/13 0510  NA 143 141 142 144 143  K 5.3* 3.5 3.6 3.3* 3.8  CL 103 103 105 107 104  CO2 26 25 26 27 28   GLUCOSE 223* 298* 278* 203* 192*  BUN 35* 36* 45* 36* 31*  CREATININE 1.46* 1.34 1.62* 1.33 1.19  CALCIUM 11.3* 11.0* 10.7* 10.3 10.8*   Liver Function Tests:  Recent Labs Lab 07/24/13 1211 07/28/13 0512  AST 20 16  ALT 13 18  ALKPHOS 85 90  BILITOT 0.5 0.3  PROT 7.1 7.3  ALBUMIN 2.9* 2.6*    Recent Labs Lab 07/26/13 1141  LIPASE 15   No results found for this basename: AMMONIA,  in the last 168 hours CBC:  Recent Labs Lab 07/24/13 0854 07/25/13 0511 07/26/13 0524 07/27/13 0524 07/28/13 0512 07/29/13 0510  WBC 14.5* 14.7* 14.8* 14.5* 12.6* 14.6*  NEUTROABS 13.1*  --   --   --   --   --   HGB 15.1 13.4 15.4 14.3 14.5 16.4  HCT 45.0 40.7 46.8 43.9 45.8 48.8  MCV 74.0* 74.0* 73.8* 73.4* 73.8* 73.3*  PLT 181 196 197 220 212 262   Cardiac Enzymes:  Recent Labs Lab 07/24/13 0854  TROPONINI <0.30   BNP (last 3 results)  Recent Labs  07/24/13 0854 07/25/13 0511  PROBNP 814.2* 1507.0*   CBG:  Recent Labs Lab 07/28/13 1125 07/28/13 1654 07/28/13 2130 07/29/13 0823 07/29/13 1117  GLUCAP 279* 226* 273* 212* 250*    Recent Results (from the past 240 hour(s))  URINE CULTURE     Status: None   Collection Time    07/24/13  9:20 AM      Result Value Range Status   Specimen Description URINE, CATHETERIZED   Final   Special Requests NONE   Final   Culture  Setup Time     Final    Value: 07/24/2013 21:04     Performed at [facility]     Final   Value: >=100,000 COLONIES/ML     Performed at [facility]   Culture     Final   Value: ESCHERICHIA COLI     Performed at [facility]   Report Status 07/26/2013 FINAL   Final   Organism ID, Bacteria ESCHERICHIA COLI   Final  CULTURE, BLOOD (ROUTINE X 2)     Status: None   Collection Time    07/24/13  9:55 AM      Result Value Range Status   Specimen Description BLOOD LEFT HAND   Final   Special Requests BOTTLES DRAWN AEROBIC AND ANAEROBIC 8CC   Final   Culture  Setup Time     Final  Value: 07/25/2013 13:50     Performed at [facility]   Culture     Final   Value: ESCHERICHIA COLI     Note: Gram Stain Report Called to,Read Back By and Verified With: DOROTHY BREWER RN ON 07/26/2013 AT 1:05A BY Dennard Nip     Performed at [facility]   Report Status 07/27/2013 FINAL   Final   Organism ID, Bacteria ESCHERICHIA COLI   Final  MRSA PCR SCREENING     Status: None   Collection Time    07/24/13 12:10 PM      Result Value Range Status   MRSA by PCR NEGATIVE  NEGATIVE Final   Comment:            The GeneXpert MRSA Assay (FDA     approved for NASAL specimens     only), is one component of a     comprehensive MRSA colonization     surveillance program. It is not     intended to diagnose MRSA     infection nor to guide or     monitor treatment for     MRSA infections.  CULTURE, BLOOD (ROUTINE X 2)     Status: None   Collection Time    07/24/13 12:11 PM      Result Value Range Status   Specimen Description BLOOD LEFT HAND   Final   Special Requests BOTTLES DRAWN AEROBIC AND ANAEROBIC 8CC   Final   Culture NO GROWTH 5 DAYS   Final   Report Status 07/29/2013 FINAL   Final     Studies: Dg Chest Port 1 View  07/28/2013   *RADIOLOGY REPORT*  Clinical Data: Hypoxia.  PORTABLE CHEST - 1 VIEW  Comparison: 07/25/2013  Findings: Prominent descending thoracic aorta appears  unchanged. Again noted are slightly prominent lung markings which could represent mild edema or vascular congestion.  No focal airspace disease or consolidation.  Heart size is grossly stable.  IMPRESSION: Stable chest radiograph findings. There may be mild vascular congestion.   Original Report Authenticated By: Markus Daft, M.D.   Dg Abd Portable 1v  07/28/2013   *RADIOLOGY REPORT*  Clinical Data: Nausea and vomiting.  PORTABLE ABDOMEN - 1 VIEW  Comparison: 07/26/2013  Findings: Supine view of the abdomen was obtained.  There is gas within the stomach.  Overall, there is decreased small bowel gaseous distention.  There continues to be gas within the small and large bowel loops.  Stool in the left colon.  IMPRESSION: Nonspecific bowel gas pattern.  Slightly decreased small bowel distention compared to the prior examination.   Original Report Authenticated By: Markus Daft, M.D.    Scheduled Meds: . albuterol  2.5 mg Nebulization BID  . amLODipine  10 mg Oral Daily  . azithromycin  500 mg Intravenous Q24H  . bisacodyl  10 mg Oral BID  . [START ON 07/30/2013] cefTRIAXone (ROCEPHIN)  IV  2 g Intravenous Q24H  . cloNIDine  0.2 mg Oral TID  . febuxostat  40 mg Oral Daily  . heparin  5,000 Units Subcutaneous Q8H  . insulin aspart  0-15 Units Subcutaneous TID WC  . insulin aspart  0-5 Units Subcutaneous QHS  . insulin glargine  25 Units Subcutaneous QHS  . ipratropium  0.5 mg Nebulization BID  . metoCLOPramide (REGLAN) injection  5 mg Intravenous Q6H  . metoprolol tartrate  50 mg Oral BID  . ondansetron (ZOFRAN) IV  4 mg Intravenous Q6H  . pantoprazole (PROTONIX) IV  40 mg Intravenous Q12H  . sucralfate  1 g Oral TID WC & HS   Continuous Infusions: . sodium chloride 20 mL/hr at 07/26/13 1000  . sodium chloride 10 mL/hr at 07/29/13 0800    Principal Problem:   Sepsis Active Problems:   DM   Gout, unspecified   HYPERTENSION   CVA   Recurrent boils   Diabetic neuropathy   Obesity    Hemiplegia affecting dominant side, late effect of cerebrovascular disease   GERD (gastroesophageal reflux disease)   Asthma   Cancer of parotid gland   Leukocytosis   Acute on chronic renal failure   Tachycardia   Hypotension   UTI (urinary tract infection)   Bacteremia   CAP (community acquired pneumonia)    Time spent: 49 minutes    [facility] Pager [phone]. If 7PM-7AM, please contact night-coverage at [email or web address], password [facility] 07/29/2013, 12:13 PM  LOS: 5 days

## 2013-07-29 NOTE — Progress Notes (Signed)
Patient exhibited unusual rhythm on heart monitor. Obtained a 12 lead that revealed sinus rhythm. Sent text page to MD. Patient was awakened and exhibited no symptoms.

## 2013-07-29 NOTE — Evaluation (Signed)
Physical Therapy Evaluation Patient Details Name: [name] MRN: [id number] DOB: 1946/01/20 Today's Date: 07/29/2013 Time: 1320-1350 PT Time Calculation (min): 30 min  PT Assessment / Plan / Recommendation History of Present Illness   [name] is a 67 yo male who was admitted for glycemic control.  [name] Post lives with his wife and mainly uses an IT trainer wheelchair.  The patient will use a cane and ambulate into the bathroom as his wheelchair can not fit through the  Door.  He has been referred to PT to improve his safety with transferring and gait.  Clinical Impression  Pt has decreased strength needing mod to max assist to transfer.  Pt should progress quickly to prior functional level.    PT Assessment  Patient needs continued PT services    Follow Up Recommendations  No PT follow up    Does the patient have the potential to tolerate intense rehabilitation    no  Barriers to Discharge  none      Equipment Recommendations    none   Recommendations for Other Services   none  Frequency Min 3X/week    Precautions / Restrictions Precautions Precautions: Fall Restrictions Weight Bearing Restrictions: No   Pertinent Vitals/Pain None      Mobility  Bed Mobility Bed Mobility: Supine to Sit Supine to Sit: 3: Mod assist Transfers Transfers: Sit to Stand;Stand Pivot Transfers Sit to Stand: 2: Max assist Stand Pivot Transfers: 2: Max assist Ambulation/Gait Ambulation/Gait Assistance: Not tested (comment) Assistive device: None    Exercises     PT Diagnosis: Generalized weakness  PT Problem List:   PT Treatment Interventions: Functional mobility training;Therapeutic exercise;Balance training;Patient/family education     PT Goals(Current goals can be found in the care plan section) Acute Rehab PT Goals PT Goal Formulation: With patient Potential to Achieve Goals: Fair  Visit Information  Last PT Received On: 07/29/13       Prior [place] expects to be discharged to:: Private residence Living Arrangements: Spouse/significant other Available Help at Discharge: Family Type of Home: House Home Access: [place]: One [place]: Wheelchair - power;Cane - quad Prior Function Level of Independence: Needs assistance Gait / Transfers Assistance Needed: mod ADL's / Homemaking Assistance Needed: max Communication / Swallowing Assistance Needed: min Communication Communication: Expressive difficulties Dominant Hand: Right    Cognition  Cognition Arousal/Alertness: Awake/alert Overall Cognitive Status: Within Functional Limits for tasks assessed    Extremity/Trunk Assessment Lower Extremity Assessment Lower Extremity Assessment: Generalized weakness   Balance Balance Balance Assessed: Yes Static Standing Balance Static Standing - Balance Support: Left upper extremity supported Static Standing - Level of Assistance: 3: Mod assist Static Standing - Comment/# of Minutes: less than one.  Pt came sit to stand x 4 to work on activity and strength but pt has a difficulty time standing totally erect for any length of time.  End of Session PT - End of Session Equipment Utilized During Treatment: Gait belt Activity Tolerance: Patient tolerated treatment well Patient left: in chair;with call bell/phone within reach;with family/visitor present  GP     RUSSELL,CINDY 07/29/2013, 2:55 PM

## 2013-07-29 NOTE — Plan of Care (Signed)
Problem: Phase I Progression Outcomes Goal: OOB as tolerated unless otherwise ordered Outcome: Progressing Out of bed and into chair twice today. Tolerated well.

## 2013-07-29 NOTE — Progress Notes (Signed)
ANTIBIOTIC CONSULT NOTE   Pharmacy Consult for Zithromax & Rocephin Indication: UTI, diabetic ulcer, r/o PNA  No Known Allergies  Patient Measurements: Height: 6' (182.9 cm) Weight: 270 lb 11.2 oz (122.789 kg) IBW/kg (Calculated) : 77.6 Last measured wt: 108.9kg on 07/16/13  Vital Signs: Temp: 98.2 F (36.8 C) (08/18 0746) Temp src: Oral (08/18 0746) BP: 181/102 mmHg (08/18 0945) Pulse Rate: 94 (08/18 0800) Intake/Output from previous day: 08/17 0701 - 08/18 0700 In: 943.2 [P.O.:480; I.V.:463.2] Out: 1600 [Urine:1600] Intake/Output from this shift: Total I/O In: -  Out: 175 [Urine:175]  Labs:  Recent Labs  07/27/13 0524 07/28/13 0512 07/29/13 0510  WBC 14.5* 12.6* 14.6*  HGB 14.3 14.5 16.4  PLT 220 212 262  CREATININE 1.62* 1.33 1.19   Estimated Creatinine Clearance: 82.7 ml/min (by C-G formula based on Cr of 1.19). No results found for this basename: VANCOTROUGH, Corlis Leak, VANCORANDOM, GENTTROUGH, GENTPEAK, GENTRANDOM, TOBRATROUGH, TOBRAPEAK, TOBRARND, AMIKACINPEAK, AMIKACINTROU, AMIKACIN,  in the last 72 hours   Microbiology: Recent Results (from the past 720 hour(s))  URINE CULTURE     Status: None   Collection Time    07/24/13  9:20 AM      Result Value Range Status   Specimen Description URINE, CATHETERIZED   Final   Special Requests NONE   Final   Culture  Setup Time     Final   Value: 07/24/2013 21:04     Performed at [place]     Final   Value: >=100,000 COLONIES/ML     Performed at [facility]   Culture     Final   Value: ESCHERICHIA COLI     Performed at [facility]   Report Status 07/26/2013 FINAL   Final   Organism ID, Bacteria ESCHERICHIA COLI   Final  CULTURE, BLOOD (ROUTINE X 2)     Status: None   Collection Time    07/24/13  9:55 AM      Result Value Range Status   Specimen Description BLOOD LEFT HAND   Final   Special Requests BOTTLES DRAWN AEROBIC AND ANAEROBIC [place]   Final   Culture   Setup Time     Final   Value: 07/25/2013 13:50     Performed at [facility]   Culture     Final   Value: ESCHERICHIA COLI     Note: Gram Stain Report Called to,Read Back By and Verified With: DOROTHY BREWER RN ON 07/26/2013 AT 1:05A BY Dennard Nip     Performed at [facility]   Report Status 07/27/2013 FINAL   Final   Organism ID, Bacteria ESCHERICHIA COLI   Final  MRSA PCR SCREENING     Status: None   Collection Time    07/24/13 12:10 PM      Result Value Range Status   MRSA by PCR NEGATIVE  NEGATIVE Final   Comment:            The GeneXpert MRSA Assay (FDA     approved for NASAL specimens     only), is one component of a     comprehensive MRSA colonization     surveillance program. It is not     intended to diagnose MRSA     infection nor to guide or     monitor treatment for     MRSA infections.  CULTURE, BLOOD (ROUTINE X 2)     Status: None   Collection Time  07/24/13 12:11 PM      Result Value Range Status   Specimen Description BLOOD LEFT HAND   Final   Special Requests BOTTLES DRAWN AEROBIC AND ANAEROBIC 8CC   Final   Culture NO GROWTH 4 DAYS   Final   Report Status PENDING   Incomplete   Medical History: Past Medical History  Diagnosis Date  . Barrett's esophagus     EGD 03/23/2011 & EGD 2/09 bx proven  . Steatohepatitis     liver biopsy 2009  . DM (diabetes mellitus)   . Diverticulosis     TCS 03/23/11 pancolonic diverticula &TCS 5/08, pancolonic diverticula  . GERD (gastroesophageal reflux disease)   . HTN (hypertension)   . CVA (cerebral infarction) 1998    right sided deficit  . Gout   . Lymphoma 1974    XRT at [facility], right base of skull area  . Hepatitis     esosiniphilic, tx with prednisone  . Renal insufficiency   . 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 Hiatal hernia   . Colon polyp 03/23/2011    tubular adenoma, Dr. Gala Romney  . Hemorrhoids, internal 03/23/2011    tcs by Dr. Gala Romney  . Hyperlipidemia   . Hemorrhagic  colitis 06/06/2012.  . Glaucoma (increased eye pressure)   . Arthritis   . Asthma     "hx of"  . 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 Lower facial weakness     Right  . Esotropia of left eye   . Edema of lower extremity 12/21/12    bilateral   . BPH (benign prostatic hyperplasia)   . Hx of radiation therapy 1974    right base of skull area-lymphoma  . Stroke 1998    right hemiparesis/plegia  . Chronic abdominal pain    Medications:  Scheduled:  . albuterol  2.5 mg Nebulization BID  . amLODipine  10 mg Oral Daily  . azithromycin  500 mg Intravenous Q24H  . bisacodyl  10 mg Oral BID  . [START ON 07/30/2013] cefTRIAXone (ROCEPHIN)  IV  2 g Intravenous Q24H  . cloNIDine  0.2 mg Oral TID  . febuxostat  40 mg Oral Daily  . heparin  5,000 Units Subcutaneous Q8H  . insulin aspart  0-15 Units Subcutaneous TID WC  . insulin aspart  0-5 Units Subcutaneous QHS  . insulin glargine  25 Units Subcutaneous QHS  . ipratropium  0.5 mg Nebulization BID  . metoCLOPramide (REGLAN) injection  5 mg Intravenous Q6H  . metoprolol tartrate  50 mg Oral Q6H  . ondansetron (ZOFRAN) IV  4 mg Intravenous Q6H  . pantoprazole (PROTONIX) IV  40 mg Intravenous Q12H   Assessment: 67 yo obese M admitted with respiratory distress, fever.  WBC and lactic acid levels elevated on admission.  Pt is now on Zithromax & Rocephin to cover for UTI, PNA, and diabetic skin lesions. Pt is afebrile.  Leukocytosis not improving.  E Coli isolates are sensitive to Rocephin.  Estimated Creatinine Clearance: 82.7 ml/min (by C-G formula based on Cr of 1.19).   Culture & Susceptibility    Antibiotic  Organism Organism Organism     ESCHERICHIA COLI     AMPICILLIN  4 SENSITIVE S Final       CEFAZOLIN  <=4 SENSITIVE S Final       CEFTRIAXONE  <=1 SENSITIVE S Final       CIPROFLOXACIN  <=0.25 SENSITIVE S Final  GENTAMICIN  <=1 SENSITIVE S Final       LEVOFLOXACIN  <=0.12 SENSITIVE S Final        NITROFURANTOIN  <=16 SENSITIVE S Final       PIP/TAZO  <=4 SENSITIVE S Final       TOBRAMYCIN  <=1 SENSITIVE S Final       TRIMETH/SULFA  <=20 SENSITIVE S Final       Goal of Therapy:  Eradicate infection.  Plan:  1) Increase Rocephin to 2gm IV q24 2) Zithromax 525m IV q24h 3) Switch Zithromax to PO when clinically improved 4) Monitor renal function and cx data    HHart RobinsonsA 07/29/2013,10:25 AM

## 2013-07-29 NOTE — Progress Notes (Signed)
Inpatient Diabetes Program Recommendations  AACE/ADA: New Consensus Statement on Inpatient Glycemic Control (2013)  Target Ranges:  Prepandial:   less than 140 mg/dL      Peak postprandial:   less than 180 mg/dL (1-2 hours)      Critically ill patients:  140 - 180 mg/dL  Results for KALEB, SEK (MRN [id number]) as of 07/29/2013 08:13  Ref. Range 07/28/2013 05:12 07/29/2013 05:10  Glucose Latest Range: 70-99 mg/dL 203 (H) 192 (H)   Results for TREVOR, WILKIE (MRN [id number]) as of 07/29/2013 08:13  Ref. Range 07/28/2013 07:48 07/28/2013 11:25 07/28/2013 16:54 07/28/2013 21:30  Glucose-Capillary Latest Range: 70-99 mg/dL 194 (H) 279 (H) 226 (H) 273 (H)    Inpatient Diabetes Program Recommendations Insulin - Basal: Please increase Lantus to 30 units QHS. Correction (SSI): May want to consider increasing Novolog correction to Resistant scale.  Note: Blood glucose ranged from 194-279 mg/dl on 8/17 and fasting blood glucose on labs this morning is 192 mg/dl.  Please consider increasing Lantus to 30 units QHS and may want to increase Novolog correction to Resistant scale.  Will continue to follow.  Thanks, Barnie Alderman, RN, MSN, CCRN Diabetes Coordinator Inpatient Diabetes Program [phone]

## 2013-07-30 DIAGNOSIS — R7881 Bacteremia: Secondary | ICD-10-CM

## 2013-07-30 DIAGNOSIS — D72829 Elevated white blood cell count, unspecified: Secondary | ICD-10-CM

## 2013-07-30 LAB — CBC
HCT: 46.5 % (ref 39.0–52.0)
Hemoglobin: 15.1 g/dL (ref 13.0–17.0)
MCV: 73.7 fL — ABNORMAL LOW (ref 78.0–100.0)
Platelets: 236 10*3/uL (ref 150–400)
RBC: 6.31 MIL/uL — ABNORMAL HIGH (ref 4.22–5.81)
WBC: 14.3 10*3/uL — ABNORMAL HIGH (ref 4.0–10.5)

## 2013-07-30 LAB — GLUCOSE, CAPILLARY
Glucose-Capillary: 247 mg/dL — ABNORMAL HIGH (ref 70–99)
Glucose-Capillary: 151 mg/dL — ABNORMAL HIGH (ref 70–99)

## 2013-07-30 LAB — BASIC METABOLIC PANEL
BUN: 32 mg/dL — ABNORMAL HIGH (ref 6–23)
CO2: 32 mEq/L (ref 19–32)
Chloride: 100 mEq/L (ref 96–112)
Creatinine, Ser: 1.49 mg/dL — ABNORMAL HIGH (ref 0.50–1.35)
Glucose, Bld: 159 mg/dL — ABNORMAL HIGH (ref 70–99)

## 2013-07-30 MED ORDER — PANTOPRAZOLE SODIUM 40 MG PO TBEC
40.0000 mg | DELAYED_RELEASE_TABLET | Freq: Two times a day (BID) | ORAL | Status: DC
  Administered 2013-07-30 – 2013-07-31 (×3): 40 mg via ORAL
  Filled 2013-07-30 (×3): qty 1

## 2013-07-30 MED ORDER — AZITHROMYCIN 250 MG PO TABS
500.0000 mg | ORAL_TABLET | Freq: Every day | ORAL | Status: DC
  Administered 2013-07-31: 500 mg via ORAL
  Filled 2013-07-30: qty 2

## 2013-07-30 NOTE — Progress Notes (Signed)
Inpatient Diabetes Program Recommendations  AACE/ADA: New Consensus Statement on Inpatient Glycemic Control (2013)  Target Ranges:  Prepandial:   less than 140 mg/dL      Peak postprandial:   less than 180 mg/dL (1-2 hours)      Critically ill patients:  140 - 180 mg/dL   Results for [name], [name] (MRN [id number]) as of 07/30/2013 08:58  Ref. Range 07/29/2013 08:23 07/29/2013 11:17 07/29/2013 16:25 07/29/2013 21:31 07/30/2013 07:23  Glucose-Capillary Latest Range: 70-99 mg/dL 212 (H) 250 (H) 246 (H) 264 (H) 133 (H)    Inpatient Diabetes Program Recommendations Correction (SSI): Please consider increasing Novolog correction to Resistant scale.  Note: Blood glucose ranged from 212-264 mg/dl on 8/18 and fasting blood glucose this morning is 133 mg/dl.  Noted from chart review that patient has poor PO intake. Please consider increasing Novolog correction scale to Resistant scale.  Will continue to follow.  Thanks, Barnie Alderman, RN, MSN, CCRN Diabetes Coordinator Inpatient Diabetes Program [phone]

## 2013-07-30 NOTE — Progress Notes (Signed)
UR chart review completed.  

## 2013-07-30 NOTE — Progress Notes (Signed)
[facility] PROGRESS NOTE  [name] SAY:[id number] DOB: Nov 01, 1946 DOA: 07/24/2013 PCP: Vic Blackbird, MD  Assessment/Plan: 1. Sepsis: Secondary to UTI. Appears clinically resolved. Continue antibiotics 2. Escherichia coli bacteremia: Secondary to UTI. Treatment as below. 3. Escherichia coli UTI: Continue antibiotics. 4. Acute respiratory failure with hypoxia: Chest x-ray suggested edema and possibly developing pneumonia. Now stable off oxygen. Repeat chest x-ray showed improvement. 5. Pneumonia: Appears to be improving. Continue ceftriaxone and Zithromax. 6. Acute on chronic kidney disease stage III: Stable. 7. Hypertension: Overall stable. 8. Ileus: X-ray shows improvement. Continue laxatives. 9. Leukocytosis: Etiology unclear. Afebrile. No evidence of untreated infection. Continue to monitor. 10. Diabetes mellitus: Hemoglobin A1c 7.2. Continue Lantus, increased towards usual dosing as that improves. 11. History of stroke: Wheelchair-bound with right hemiparesis. Appears stable. 12. Obesity, BMI 32.7 13. GERD: Continue PPI, Carafate 14. Asthma: Stable.   Change to oral antibiotics 8/20  Transfer to medical floor  Code Status: DO NOT RESUSCITATE  DVT prophylaxis: Heparin Family Communication: None present Disposition Plan: Likely home within 33 hours  Murray Hodgkins, MD  [facility]  Pager [phone] If 7PM-7AM, please contact night-coverage at [email or web address], password [facility] 07/30/2013, 12:00 PM  LOS: 6 days   Consultants:  Physical therapy: No followup.  Procedures:    Antibiotics:  Rocephin 07/24/13>>   Azithromycin 8/14>>  HPI/Subjective: Feels better. Breathing better. Tolerating diet.  Objective: Filed Vitals:   07/30/13 0500 07/30/13 0712 07/30/13 0800 07/30/13 0925  BP: 168/102  137/113 164/97  Pulse: 84  109 119  Temp:   98.1 F (36.7 C)   TempSrc:   Oral   Resp:      Height:      Weight: 120.2 kg (264 lb 15.9 oz)     SpO2: 98%  97% 96%     Intake/Output Summary (Last 24 hours) at 07/30/13 1200 Last data filed at 07/30/13 0925  Gross per 24 hour  Intake    270 ml  Output    150 ml  Net    120 ml     Filed Weights   07/28/13 0500 07/29/13 0647 07/30/13 0500  Weight: 122.6 kg (270 lb 4.5 oz) 122.789 kg (270 lb 11.2 oz) 120.2 kg (264 lb 15.9 oz)    Exam:   Tachycardic, hypertensive, afebrile. Oxygen saturation high 90s on 2 L per minute.  Appears calm and comfortable.  Psychiatric: Grossly normal mood and affect. Speech fluent and appropriate.  Cardiovascular: Regular rate and rhythm. No murmur, rub, gallop. No lower extremity edema.  Respiratory: Clear to auscultation bilaterally. No wheezes, rales, rhonchi. Normal respiratory effort.  Abdomen: Soft, nontender, nondistended.  Right hemiparesis noted  Data Reviewed:  Capillary blood sugars stable  Basic metabolic panel notable for BUN 32, creatinine 1.49 somewhat increased over yesterday. Mild leukocytosis persists without significant change  Pending studies:   None  Scheduled Meds: . albuterol  2.5 mg Nebulization BID  . amLODipine  10 mg Oral Daily  . azithromycin  500 mg Intravenous Q24H  . bisacodyl  10 mg Oral BID  . cefTRIAXone (ROCEPHIN)  IV  2 g Intravenous Q24H  . cloNIDine  0.2 mg Oral TID  . febuxostat  40 mg Oral Daily  . heparin  5,000 Units Subcutaneous Q8H  . insulin aspart  0-15 Units Subcutaneous TID WC  . insulin aspart  0-5 Units Subcutaneous QHS  . insulin glargine  25 Units Subcutaneous QHS  . ipratropium  0.5 mg Nebulization BID  . metoCLOPramide (REGLAN) injection  5  mg Intravenous Q6H  . metoprolol tartrate  50 mg Oral BID  . ondansetron (ZOFRAN) IV  4 mg Intravenous Q6H  . pantoprazole (PROTONIX) IV  40 mg Intravenous Q12H  . sucralfate  1 g Oral TID WC & HS   Continuous Infusions: . sodium chloride 20 mL/hr at 07/26/13 1000  . sodium chloride 10 mL/hr at 07/29/13 1800    Principal Problem:    Sepsis Active Problems:   DM   Gout, unspecified   HYPERTENSION   CVA   Recurrent boils   Diabetic neuropathy   Obesity   Hemiplegia affecting dominant side, late effect of cerebrovascular disease   GERD (gastroesophageal reflux disease)   Asthma   Cancer of parotid gland   Leukocytosis   Acute on chronic renal failure   Tachycardia   Hypotension   UTI (urinary tract infection)   Bacteremia   CAP (community acquired pneumonia)   Time spent 20 minutes

## 2013-07-31 ENCOUNTER — Telehealth: Payer: Self-pay | Admitting: Family Medicine

## 2013-07-31 LAB — GLUCOSE, CAPILLARY: Glucose-Capillary: 217 mg/dL — ABNORMAL HIGH (ref 70–99)

## 2013-07-31 MED ORDER — INSULIN GLARGINE 100 UNIT/ML ~~LOC~~ SOLN: 40.0000 [IU] | Freq: Every day | SUBCUTANEOUS | Status: DC

## 2013-07-31 MED ORDER — DEXTROSE 5 % IV SOLN
INTRAVENOUS | Status: AC
  Filled 2013-07-31: qty 500

## 2013-07-31 MED ORDER — CEFUROXIME AXETIL 500 MG PO TABS: 500.0000 mg | ORAL_TABLET | Freq: Two times a day (BID) | ORAL | Status: DC

## 2013-07-31 MED ORDER — CEFUROXIME AXETIL 250 MG PO TABS: 500.0000 mg | ORAL_TABLET | Freq: Two times a day (BID) | ORAL | Status: DC

## 2013-07-31 NOTE — Progress Notes (Signed)
UR chart review completed.  

## 2013-07-31 NOTE — Progress Notes (Signed)
07/31/13 1417 Patient discharged home with family. Home health set up with [place] per case management. Discharge instructions reviewed with patient per C. Toney Rakes, Therapist, sports. Given copy of instructions, medication list, prescription, f/u information. IV site d/c'd per C. Toney Rakes, RN, site within normal limits. Pt left floor in stable condition via w/c accompanied by nursing staff. Donavan Foil, RN

## 2013-07-31 NOTE — Progress Notes (Signed)
[facility] PROGRESS NOTE  Jason BORAWSKI CXK:[id number] DOB: 1946-02-28 DOA: 07/24/2013 PCP: Vic Blackbird, MD  Endocrinologist: Dr. Dorris Fetch  Assessment/Plan: 1. Sepsis: Secondary to UTI. Appears clinically resolved.Change to oral antibiotics.  2. Escherichia coli bacteremia: Secondary to UTI. Treatment as below. 3. Escherichia coli UTI: Continue antibiotics, Change to oral  4. Acute respiratory failure with hypoxia:  resolved. Chest x-ray suggested edema and possibly developing pneumonia.  Repeat chest x-ray showed improvement.Completed Zithromax.  5. Pneumonia: Appears to be improving.Completed Zithromax. Complete oral antibiotics as an outpatient.  6. Acute on chronic kidney disease stage III: Stable. 7. Hypertension: Overall stable. 8. Ileus:  clinically resolved. X-ray shows improvement. C 9. Leukocytosis: Etiology unclear. Afebrile. No evidence of untreated infection.  followup as an outpatient. Expect spontaneous resolution.  10. Diabetes mellitus: Hemoglobin A1c 7.2. Continue Lantus, increased towards usual dosing as that improves.Discussed with wife and son at bedside.  11. History of stroke with right-sided hemiparesis: Wheelchair-bound with right hemiparesis. Appears stable. 12. Obesity, BMI 32.7 13. GERD: Continue PPI 14. Asthma: Stable. 15. Hypercalcemia:chronic finding, present November 2013, August 2013, 2011. Discontinue hydrochlorothiazide. Followup as an outpatient.   Change to oral antibiotics  Home today with home health RN, resume physical therapy occupational therapy for frozen shoulder as per Dr. Aline Brochure  Discussed with son and wife at bedside. All questions answered.  Code Status: DO NOT RESUSCITATE  DVT prophylaxis: Heparin Family Communication: As above  Disposition Plan: As above  Murray Hodgkins, MD  [facility]  Pager [phone] If 7PM-7AM, please contact night-coverage at [email or web address], password [facility] 07/31/2013, 10:40 AM  LOS: 7 days    Summary: 67 year old man presented with fever, chills and shortness of breath. Initial evaluation was notable for sepsis, UTI and patient was admitted.  Patient was treated empirically with antibiotics for UTI. Subsequent blood cultures and urine culture revealed history she had coli sepsis. He responded well to antibiotics and stabilized. An element of acute renal failure has subsequently improved. He developed some hypoxia, chest x-ray indicated possible pneumonia and patient was treated empirically. He did have some nausea and vomiting which has resolved and imaging was unremarkable. Blood pressure was difficult to control for time but with medication adjustment is now stable.  Consultants:  Physical therapy: No followup.  Procedures:  2-D echocardiogram: Left ventricular ejection fraction 60-65%. Grade 1 diastolic dysfunction.  Antibiotics:  Rocephin 07/24/13>> 8/20  Ceftin 8/21 >> 8/26  Azithromycin 8/14>> 8/20  HPI/Subjective: Feels good. Eating fine. No nausea or vomiting. No pain. Wants to go home. Lab work could not be obtained this morning.  Objective: Filed Vitals:   07/30/13 2054 07/30/13 2156 07/31/13 0500 07/31/13 0719  BP:  150/94 154/93   Pulse:  100 86   Temp:  98.3 F (36.8 C) 99 F (37.2 C)   TempSrc:  Oral Oral   Resp:  20 18   Height:      Weight:   123.832 kg (273 lb)   SpO2: 95% 94% 93% 93%    Intake/Output Summary (Last 24 hours) at 07/31/13 1040 Last data filed at 07/31/13 0500  Gross per 24 hour  Intake    410 ml  Output    325 ml  Net     85 ml     Filed Weights   07/29/13 0647 07/30/13 0500 07/31/13 0500  Weight: 122.789 kg (270 lb 11.2 oz) 120.2 kg (264 lb 15.9 oz) 123.832 kg (273 lb)    Exam:   Afebrile, vital signs stable. No hypoxia.  General: Appears calm and comfortable. Speech fluent and clear. Appears well.  Cardiovascular: Regular rate and rhythm. No murmur, rub, gallop. 1+ extremity edema.  Respiratory: Clear to  auscultation bilaterally.No wheezes, rales, rhonchi. Normal respiratory effort.  Abdomen: Positive bowel sounds. Soft, nontender, nondistended.  Psychiatric: Grossly normal mood and affect.  Data Reviewed:  Capillary blood sugars stable  Repeat lab work could not be obtained  Pending studies:   None  Scheduled Meds: . albuterol  2.5 mg Nebulization BID  . amLODipine  10 mg Oral Daily  . azithromycin  500 mg Oral Daily  . bisacodyl  10 mg Oral BID  . cefTRIAXone (ROCEPHIN)  IV  2 g Intravenous Q24H  . cloNIDine  0.2 mg Oral TID  . febuxostat  40 mg Oral Daily  . heparin  5,000 Units Subcutaneous Q8H  . insulin aspart  0-15 Units Subcutaneous TID WC  . insulin aspart  0-5 Units Subcutaneous QHS  . insulin glargine  25 Units Subcutaneous QHS  . ipratropium  0.5 mg Nebulization BID  . metoprolol tartrate  50 mg Oral BID  . ondansetron (ZOFRAN) IV  4 mg Intravenous Q6H  . pantoprazole  40 mg Oral BID  . sucralfate  1 g Oral TID WC & HS   Continuous Infusions: . sodium chloride 20 mL/hr at 07/26/13 1000  . sodium chloride 10 mL/hr at 07/29/13 1800    Principal Problem:   Sepsis Active Problems:   DM   Gout, unspecified   HYPERTENSION   CVA   Recurrent boils   Diabetic neuropathy   Obesity   Hemiplegia affecting dominant side, late effect of cerebrovascular disease   GERD (gastroesophageal reflux disease)   Asthma   Cancer of parotid gland   Leukocytosis   Acute on chronic renal failure   Tachycardia   Hypotension   UTI (urinary tract infection)   Bacteremia   CAP (community acquired pneumonia)

## 2013-07-31 NOTE — Discharge Summary (Signed)
Physician Discharge Summary  [name] LOV:[id number] DOB: 1946/04/02 DOA: 07/24/2013  PCP: Vic Blackbird, MD  Admit date: 07/24/2013 Discharge date: 07/31/2013  Recommendations for Outpatient Follow-up:  1. Followup resolution of UTI 2. Followup chronic kidney disease stage III is clinically indicated 3. Followup leukocytosis, spontaneous resolution is expected , consider repeat CBC as an outpatient 4. Followup hypercalcemia as clinically indicated, hydrochlorothiazide currently on hold. 5. Resume home health physical and occupational therapy for frozen shoulder as per orthopedics 6. Home health RN   Follow-up Information   Follow up with Vic Blackbird, MD In 1 week.   Specialty:  Family Medicine   Contact information:   [address] [place] [address] [phone]      Discharge Diagnoses:  1. Sepsis 2. E coli bacteremia 3. E coli UTI 4. Acute respiratory failure with hypoxia 5. Pneumonia 6. Acute on chronic kidney disease stage III 7. Leukocytosis 8. Diabetes mellitus 9. Hypercalcemia  Discharge Condition: Improved Disposition: Return home  Diet recommendation: Diabetic diet  Filed Weights   07/29/13 0647 07/30/13 0500 07/31/13 0500  Weight: 122.789 kg (270 lb 11.2 oz) 120.2 kg (264 lb 15.9 oz) 123.832 kg (273 lb)    History of present illness:  67 year old man presented with fever, chills and shortness of breath. Initial evaluation was notable for sepsis, UTI and patient was admitted.  Hospital Course:  Patient was treated empirically with antibiotics for UTI. Subsequent blood cultures and urine culture revealed E coli sepsis. He responded well to antibiotics and stabilized. An element of acute renal failure has subsequently improved. He developed some hypoxia, chest x-ray indicated possible pneumonia and patient was treated empirically. He did have some nausea and vomiting which has resolved and imaging was unremarkable. Blood pressure was  difficult to control for time but with medication adjustment is now stable. He is stable for discharge and will followup as an outpatient.  1. Sepsis: Secondary to UTI. Appears clinically resolved.Change to oral antibiotics.  2. Escherichia coli bacteremia: Secondary to UTI. Treatment as below. 3. Escherichia coli UTI: Continue antibiotics, Change to oral  4. Acute respiratory failure with hypoxia: resolved. Chest x-ray suggested edema and possibly developing pneumonia. Repeat chest x-ray showed improvement.Completed Zithromax.  5. Pneumonia: Appears to be improving.Completed Zithromax. Complete oral antibiotics as an outpatient.  6. Acute on chronic kidney disease stage III: Stable. 7. Hypertension: Overall stable. 8. Ileus: clinically resolved. X-ray shows improvement.  9. Leukocytosis: Etiology unclear. Afebrile. No evidence of untreated infection. followup as an outpatient. Expect spontaneous resolution.  10. Diabetes mellitus: Hemoglobin A1c 7.2. Continue Lantus, increased towards usual dosing as that improves.Discussed with wife and son at bedside.  11. History of stroke with right-sided hemiparesis: Wheelchair-bound with right hemiparesis. Appears stable. 12. Obesity, BMI 32.7 13. GERD: Continue PPI 14. Asthma: Stable. 15. Hypercalcemia:chronic finding, present November 2013, August 2013, 2011. Discontinue hydrochlorothiazide. Followup as an outpatient.  Consultants:  Physical therapy: No followup. Procedures:  2-D echocardiogram: Left ventricular ejection fraction 60-65%. Grade 1 diastolic dysfunction. Antibiotics:  Rocephin 07/24/13>> 8/20  Ceftin 8/21 >> 8/26 Azithromycin 8/14>> 8/20   Discharge Instructions  Discharge Orders   Future Appointments Provider Department Dept Phone   09/17/2013 10:30 AM Carole Civil, MD [facility] [phone]   10/02/2013 9:00 AM [name], MD [facility] [phone]   Future Orders  Complete By Expires   Diet Carb Modified  As directed    Discharge instructions  As directed  Comments:     Be sure to finish antibiotic (Ceftin). Stop hydrochlorothiazide. Monitor your blood sugars, increase your Lantus dose back to your usual dose of 60 units each night as your diet improves. Call your physician or seek immediate medical attention for fever or worsening of your symptoms.   Increase activity slowly  As directed        Medication List    STOP taking these medications       hydrochlorothiazide 25 MG tablet  Commonly known as:  HYDRODIURIL     naproxen sodium 220 MG tablet  Commonly known as:  ANAPROX      TAKE these medications       allopurinol 300 MG tablet  Commonly known as:  ZYLOPRIM  Take 1 tablet (300 mg  total) by mouth daily.     amLODipine 10 MG tablet  Commonly known as:  NORVASC  Take 1 tablet by mouth   daily     cefUROXime 500 MG tablet  Commonly known as:  CEFTIN  Take 1 tablet (500 mg total) by mouth 2 (two) times daily with a meal.     cloNIDine 0.2 MG tablet  Commonly known as:  CATAPRES  Take 1 tablet (0.2 mg total) by mouth 3 (three) times daily.     clotrimazole-betamethasone cream  Commonly known as:  LOTRISONE  Apply to affected area 2 times daily     gabapentin 300 MG capsule  Commonly known as:  NEURONTIN  Take 2 capsules (600 mg total) by mouth 3 (three) times daily.     insulin glargine 100 UNIT/ML injection  Commonly known as:  LANTUS  Inject 0.4 mL (40 Units total) into the skin at bedtime.     insulin lispro 100 UNIT/ML injection  Commonly known as:  HUMALOG  Inject 14-17 Units into the skin 3 (three) times daily before meals. Uses according to sliding scale at home. And 14 units QAC     lisinopril 20 MG tablet  Commonly known as:  PRINIVIL,ZESTRIL  Take 1 tablet by mouth  daily     metoprolol succinate 50 MG 24 hr tablet  Commonly known as:  TOPROL-XL  Take 1 tablet by mouth  daily with or immediately   following a meal     omeprazole 40 MG capsule  Commonly known as:  PRILOSEC  Take 1 capsule by mouth  daily     oxyCODONE-acetaminophen 5-325 MG per tablet  Commonly known as:  PERCOCET/ROXICET  1 tablet three times a day as needed for pain     simvastatin 20 MG tablet  Commonly known as:  ZOCOR  Take 1 tablet by mouth at   bedtime     tiZANidine 2 MG tablet  Commonly known as:  ZANAFLEX  Take 1 tablet (2 mg total) by mouth 2 (two) times daily as needed.     TRADJENTA 5 MG Tabs tablet  Generic drug:  linagliptin  Take 1 tablet by mouth  daily     ULORIC 40 MG tablet  Generic drug:  febuxostat  Take 1 tablet by mouth   daily       No Known Allergies  The results of significant diagnostics from this hospitalization (including imaging, microbiology, ancillary and laboratory) are listed below for reference.    Significant Diagnostic Studies: Ct Abdomen Pelvis Wo Contrast  07/24/2013   *RADIOLOGY REPORT*  Clinical Data: Abdominal pain, cough, chills.  Chronic renal insufficiency, hypertension.  CT ABDOMEN AND PELVIS WITHOUT CONTRAST  Technique:  Multidetector CT imaging of the abdomen and pelvis was performed following the standard protocol without intravenous contrast.  Comparison: 08/11/2012  Findings: Chronic pleural thickening posteriorly at the left lung base. Surgical clips in the gallbladder fossa.  Unremarkable uninfused evaluation of liver, spleen, adrenal glands, pancreas.  Stable low- attenuation renal lesions corresponding to a cyst previously identified.  No hydronephrosis.  Streaky inflammatory/edematous changes around bilateral kidneys, more conspicuous than on prior exam.  No hydronephrosis or ureterectasis.  No nephrolithiasis. Patchy aortoiliac arterial calcifications without aneurysm. Stomach, small bowel, and colon are nondilated.  Urinary bladder incompletely distended.  Moderate prostatic enlargement.  The stable coarse right pelvic vascular calcifications.  No  ascites. No free air.  No adenopathy.  Scattered ascending, descending and descending colon diverticula without significant adjacent inflammatory/edematous change.  Coarse sclerotic impairment of appearance of the L3 vertebral body with compression deformity involving the inferior endplates centrally without significant loss of height, stable since previous exam.  IMPRESSION:  1.  No acute abdominal process. 2.  Ascending and descending colonic diverticulosis as before.   Original Report Authenticated By: D. Wallace Going, MD   Dg Chest Port 1 View  07/28/2013   *RADIOLOGY REPORT*  Clinical Data: Hypoxia.  PORTABLE CHEST - 1 VIEW  Comparison: 07/25/2013  Findings: Prominent descending thoracic aorta appears unchanged. Again noted are slightly prominent lung markings which could represent mild edema or vascular congestion.  No focal airspace disease or consolidation.  Heart size is grossly stable.  IMPRESSION: Stable chest radiograph findings. There may be mild vascular congestion.   Original Report Authenticated By: Markus Daft, M.D.   Dg Chest Portable 1 View  07/24/2013   *RADIOLOGY REPORT*  Clinical Data: Shortness of breath  PORTABLE CHEST - 1 VIEW  Comparison: 07/05/2011.  05/05/2010.  Findings: Heart size is normal.  The aorta is unfolded.  I think there is pulmonary venous hypertension.  There are chronically abnormal interstitial lung markings.  Cannot rule out early interstitial edema.  No consolidation, collapse or effusion.  No acute bony finding.  IMPRESSION: Possible pulmonary venous hypertension.  Chronic prominent interstitial markings consistent with chronic lung disease.  Cannot rule out an element of interstitial edema.  Unfolded aorta.   Original Report Authenticated By: Nelson Chimes, M.D.   Dg Abd Portable 1v  07/28/2013   *RADIOLOGY REPORT*  Clinical Data: Nausea and vomiting.  PORTABLE ABDOMEN - 1 VIEW  Comparison: 07/26/2013  Findings: Supine view of the abdomen was obtained.  There is gas  within the stomach.  Overall, there is decreased small bowel gaseous distention.  There continues to be gas within the small and large bowel loops.  Stool in the left colon.  IMPRESSION: Nonspecific bowel gas pattern.  Slightly decreased small bowel distention compared to the prior examination.   Original Report Authenticated By: Markus Daft, M.D.   Dg Abd Portable 1v  07/26/2013   *RADIOLOGY REPORT*  Clinical Data: Vomiting and fever.  PORTABLE ABDOMEN - 1 VIEW  Comparison: 07/24/2013 CT.  Findings: Interval development of gas filled slightly prominent size small bowel loops measuring up to 3.3 cm.  Gas and partially stool filled colon.  Appearance may be related to an ileus.  The possibility of free intraperitoneal air cannot be addressed on a supine view.  Post cholecystectomy.  IMPRESSION: Interval development of gas filled slightly prominent size small bowel loops measuring up to 3.3 cm.  Gas and partially stool filled colon.  Appearance may be related to an ileus.   Original Report  Authenticated By: Genia Del, M.D.    Microbiology: Recent Results (from the past 240 hour(s))  URINE CULTURE     Status: None   Collection Time    07/24/13  9:20 AM      Result Value Range Status   Specimen Description URINE, CATHETERIZED   Final   Special Requests NONE   Final   Culture  Setup Time     Final   Value: 07/24/2013 21:04     Performed at [facility]     Final   Value: >=100,000 COLONIES/ML     Performed at [facility]   Culture     Final   Value: ESCHERICHIA COLI     Performed at [facility]   Report Status 07/26/2013 FINAL   Final   Organism ID, Bacteria ESCHERICHIA COLI   Final  CULTURE, BLOOD (ROUTINE X 2)     Status: None   Collection Time    07/24/13  9:55 AM      Result Value Range Status   Specimen Description BLOOD LEFT HAND   Final   Special Requests BOTTLES DRAWN AEROBIC AND ANAEROBIC 8CC   Final   Culture  Setup Time     Final    Value: 07/25/2013 13:50     Performed at [facility]   Culture     Final   Value: ESCHERICHIA COLI     Note: Gram Stain Report Called to,Read Back By and Verified With: DOROTHY BREWER RN ON 07/26/2013 AT 1:05A BY Dennard Nip     Performed at [facility]   Report Status 07/27/2013 FINAL   Final   Organism ID, Bacteria ESCHERICHIA COLI   Final  MRSA PCR SCREENING     Status: None   Collection Time    07/24/13 12:10 PM      Result Value Range Status   MRSA by PCR NEGATIVE  NEGATIVE Final   Comment:            The GeneXpert MRSA Assay (FDA     approved for NASAL specimens     only), is one component of a     comprehensive MRSA colonization     surveillance program. It is not     intended to diagnose MRSA     infection nor to guide or     monitor treatment for     MRSA infections.  CULTURE, BLOOD (ROUTINE X 2)     Status: None   Collection Time    07/24/13 12:11 PM      Result Value Range Status   Specimen Description BLOOD LEFT HAND   Final   Special Requests BOTTLES DRAWN AEROBIC AND ANAEROBIC 8CC   Final   Culture NO GROWTH 5 DAYS   Final   Report Status 07/29/2013 FINAL   Final     Labs: Basic Metabolic Panel:  Recent Labs Lab 07/26/13 1435 07/27/13 0524 07/28/13 0512 07/29/13 0510 07/30/13 0422  NA 141 142 144 143 139  K 3.5 3.6 3.3* 3.8 3.6  CL 103 105 107 104 100  CO2 25 26 27 28  32  GLUCOSE 298* 278* 203* 192* 159*  BUN 36* 45* 36* 31* 32*  CREATININE 1.34 1.62* 1.33 1.19 1.49*  CALCIUM 11.0* 10.7* 10.3 10.8* 10.6*   Liver Function Tests:  Recent Labs Lab 07/24/13 1211 07/28/13 0512  AST 20 16  ALT 13 18  ALKPHOS 85 90  BILITOT 0.5 0.3  PROT  7.1 7.3  ALBUMIN 2.9* 2.6*    Recent Labs Lab 07/26/13 1141 07/29/13 0555  LIPASE 15 47   CBC:  Recent Labs Lab 07/26/13 0524 07/27/13 0524 07/28/13 0512 07/29/13 0510 07/30/13 0422  WBC 14.8* 14.5* 12.6* 14.6* 14.3*  HGB 15.4 14.3 14.5 16.4 15.1  HCT 46.8 43.9 45.8 48.8 46.5  MCV  73.8* 73.4* 73.8* 73.3* 73.7*  PLT 197 220 212 262 236     Recent Labs  07/24/13 0854 07/25/13 0511  PROBNP 814.2* 1507.0*   CBG:  Recent Labs Lab 07/30/13 0723 07/30/13 1109 07/30/13 1706 07/30/13 2155 07/31/13 0750  GLUCAP 133* 247* 151* 165* 161*    Principal Problem:   Sepsis Active Problems:   DM   Gout, unspecified   HYPERTENSION   CVA   Recurrent boils   Diabetic neuropathy   Obesity   Hemiplegia affecting dominant side, late effect of cerebrovascular disease   GERD (gastroesophageal reflux disease)   Asthma   Cancer of parotid gland   Leukocytosis   Acute on chronic renal failure   Tachycardia   Hypotension   UTI (urinary tract infection)   Bacteremia   CAP (community acquired pneumonia)   Time coordinating discharge: 40 minutes  Signed:  Murray Hodgkins, MD [facility] 07/31/2013, 11:30 AM

## 2013-08-02 MED ORDER — SIMVASTATIN 20 MG PO TABS: ORAL_TABLET | ORAL | Status: DC

## 2013-08-02 MED ORDER — TAMSULOSIN HCL 0.4 MG PO CAPS: 0.4000 mg | ORAL_CAPSULE | Freq: Every day | ORAL | Status: DC

## 2013-08-02 NOTE — Telephone Encounter (Signed)
Rx Refilled  

## 2013-08-07 ENCOUNTER — Other Ambulatory Visit: Payer: Self-pay | Admitting: Family Medicine

## 2013-08-07 ENCOUNTER — Encounter: Payer: Self-pay | Admitting: Family Medicine

## 2013-08-07 ENCOUNTER — Ambulatory Visit (INDEPENDENT_AMBULATORY_CARE_PROVIDER_SITE_OTHER): Payer: Medicare Other | Admitting: Family Medicine

## 2013-08-07 VITALS — BP 126/86 | HR 78 | Temp 99.4°F | Resp 20 | Wt 271.0 lb

## 2013-08-07 DIAGNOSIS — R609 Edema, unspecified: Secondary | ICD-10-CM

## 2013-08-07 DIAGNOSIS — N400 Enlarged prostate without lower urinary tract symptoms: Secondary | ICD-10-CM

## 2013-08-07 DIAGNOSIS — N183 Chronic kidney disease, stage 3 unspecified: Secondary | ICD-10-CM | POA: Insufficient documentation

## 2013-08-07 DIAGNOSIS — I10 Essential (primary) hypertension: Secondary | ICD-10-CM

## 2013-08-07 DIAGNOSIS — N390 Urinary tract infection, site not specified: Secondary | ICD-10-CM

## 2013-08-07 DIAGNOSIS — R600 Localized edema: Secondary | ICD-10-CM

## 2013-08-07 DIAGNOSIS — A419 Sepsis, unspecified organism: Secondary | ICD-10-CM

## 2013-08-07 NOTE — Patient Instructions (Addendum)
Restart flomax We will call with lab results [place] once a day  F/U as previous

## 2013-08-08 ENCOUNTER — Encounter: Payer: Self-pay | Admitting: Family Medicine

## 2013-08-08 DIAGNOSIS — A419 Sepsis, unspecified organism: Secondary | ICD-10-CM | POA: Insufficient documentation

## 2013-08-08 DIAGNOSIS — N390 Urinary tract infection, site not specified: Secondary | ICD-10-CM | POA: Insufficient documentation

## 2013-08-08 MED ORDER — TAMSULOSIN HCL 0.4 MG PO CAPS: 0.4000 mg | ORAL_CAPSULE | Freq: Every day | ORAL | Status: DC

## 2013-08-08 NOTE — Assessment & Plan Note (Signed)
Chronic LE edema,uses compression hose I think UE edema is due to recent IV use especially in RUE which he is paralyzed in I expect this will resolve over next week, will have Nurse check this

## 2013-08-08 NOTE — Assessment & Plan Note (Signed)
Difficulty controlling BP during admission, improved today, will restart low dose HCTZ

## 2013-08-08 NOTE — Assessment & Plan Note (Signed)
Mildly elevated Ca which has been present > 1 year, based on risk factors and renal function he does better with HCTZ. Will continue to monitor if he trends > 11 will consider D/C

## 2013-08-08 NOTE — Assessment & Plan Note (Signed)
Recheck metabolic panel Will stick with low dose HCTZ instead of changing to lasix

## 2013-08-08 NOTE — Progress Notes (Signed)
  Subjective:    Patient ID: [name], male    DOB: [birth year], 67 y.o.   MRN: [id number]  HPI  Pt here for hospital follow up, recent admission for E.coli urosepsis. Treated with antibiotics, completed Cefdinir yesterday. No UTI symptoms,no change in chronic abdominal pain. He is out of flomax.  2 days after discharge noticed swelling in both hands and worse in feet. They had to put IV in his right arm which is non functional. HCTZ was also stopped due to mild hypercalcemia.  DM- his A1C looked good at 7.2% Hospital discharge reviewed Wife also notes with little exertion ambulating he tends to break out in a sweat and is easily fatigued  Review of Systems   GEN- + fatigue, fever, weight loss,weakness, recent illness HEENT- denies eye drainage, change in vision, nasal discharge, CVS- denies chest pain, palpitations RESP- denies SOB, cough, wheeze ABD- denies N/V, change in stools,+ chronic abd pain GU- denies dysuria, hematuria, dribbling, incontinence MSK- +joint pain, muscle aches, injury Neuro- denies headache, dizziness, syncope, seizure activity      Objective:   Physical Exam GEN- NAD, alert and oriented x3, sitting in wheelchair, obese HEENT- EOMI, non injected sclera, pink conjunctiva, MMM, oropharynx clear, Neck- decreased ROM, no LAD CVS- RRR, no murmur RESP-CTAB ABD-NABS,soft, TTP Right side, no rebound, no gaurding, no suprapubic tenderness EXT-+ edema R >L , bilateral hands/wrist non pitting edema R>L Pulses- Radial, 2+, DP diminished bilat Neuro- right hemiplegia. Unable to stand without significant assistance.        Assessment & Plan:

## 2013-08-08 NOTE — Assessment & Plan Note (Signed)
S/p completion of antibiotics Symptoms resolved Attempted to get CBC w diff and metabolic panel but unable to get blood, will have him go to lab in [place] tomorrow

## 2013-08-08 NOTE — Assessment & Plan Note (Signed)
Restart flomax

## 2013-08-09 LAB — BASIC METABOLIC PANEL
Calcium: 10.5 mg/dL (ref 8.4–10.5)
Sodium: 136 mEq/L (ref 135–145)

## 2013-08-09 LAB — CBC WITH DIFFERENTIAL/PLATELET
Basophils Absolute: 0 10*3/uL (ref 0.0–0.1)
Basophils Relative: 1 % (ref 0–1)
Eosinophils Absolute: 0.2 10*3/uL (ref 0.0–0.7)
Lymphs Abs: 1.2 10*3/uL (ref 0.7–4.0)
MCH: 23.1 pg — ABNORMAL LOW (ref 26.0–34.0)
Neutrophils Relative %: 67 % (ref 43–77)
Platelets: 291 10*3/uL (ref 150–400)
RBC: 5.53 MIL/uL (ref 4.22–5.81)
RDW: 18.9 % — ABNORMAL HIGH (ref 11.5–15.5)

## 2013-08-13 ENCOUNTER — Telehealth: Payer: Self-pay | Admitting: Family Medicine

## 2013-08-13 NOTE — Telephone Encounter (Signed)
Clonidine 0.2 mg tab 1 TID #270

## 2013-08-14 ENCOUNTER — Telehealth: Payer: Self-pay | Admitting: Orthopedic Surgery

## 2013-08-14 MED ORDER — CLONIDINE HCL 0.2 MG PO TABS: 0.2000 mg | ORAL_TABLET | Freq: Three times a day (TID) | ORAL | Status: DC

## 2013-08-14 NOTE — Telephone Encounter (Signed)
Gave verbal order to Merrilee Seashore the therapist, to extend therapy.

## 2013-08-14 NOTE — Telephone Encounter (Signed)
Call received from [facility], occupational therapist for [place], direct ph# [phone], states she has seen patient again, and is requesting an updated order for occupational therapy to reflect: "2 x per week for 3 weeks"   (Previous order from 07/23/13 had been for:)   Comments:  Referred to [facility] for in home OT left shoulder adhesive capsulitis. 3 times a week for 6 weeks   Please advise.

## 2013-08-14 NOTE — Telephone Encounter (Signed)
Meds refilled.

## 2013-08-21 ENCOUNTER — Encounter (HOSPITAL_COMMUNITY): Payer: Self-pay | Admitting: Oncology

## 2013-08-23 ENCOUNTER — Telehealth: Payer: Self-pay | Admitting: Family Medicine

## 2013-08-23 MED ORDER — OXYCODONE-ACETAMINOPHEN 5-325 MG PO TABS: ORAL_TABLET | ORAL | Status: DC

## 2013-08-23 NOTE — Telephone Encounter (Signed)
?  ok to refill °

## 2013-08-23 NOTE — Telephone Encounter (Signed)
refilled 

## 2013-08-29 ENCOUNTER — Ambulatory Visit (INDEPENDENT_AMBULATORY_CARE_PROVIDER_SITE_OTHER): Payer: Medicare Other | Admitting: Otolaryngology

## 2013-09-17 ENCOUNTER — Ambulatory Visit (INDEPENDENT_AMBULATORY_CARE_PROVIDER_SITE_OTHER): Payer: Medicare Other | Admitting: Orthopedic Surgery

## 2013-09-17 VITALS — BP 115/78 | Ht 72.0 in | Wt 271.0 lb

## 2013-09-17 DIAGNOSIS — M7502 Adhesive capsulitis of left shoulder: Secondary | ICD-10-CM

## 2013-09-17 DIAGNOSIS — M7500 Adhesive capsulitis of unspecified shoulder: Secondary | ICD-10-CM

## 2013-09-17 NOTE — Progress Notes (Signed)
Patient ID: [name], male   DOB: 09/26/1946, 67 y.o.   MRN: [id number]  Chief Complaint  Patient presents with  . Follow-up    2 month recheck left shoulder adhesive capsulitis    Followup adhesive capsulitis patient completed home therapy therapist recommend outpatient treatment  He still has a stiff painful shoulder recommend outpatient treatment followup in 3 months

## 2013-09-17 NOTE — Patient Instructions (Signed)
Call to arrange therapy

## 2013-09-24 ENCOUNTER — Telehealth: Payer: Self-pay | Admitting: Family Medicine

## 2013-09-24 NOTE — Telephone Encounter (Signed)
Received documenation pt wheelchair denied. I called insurance company 22-Oct-2023 there were many codes listed that were above the standardized motor wheel chair which is what I ordered, instead they had advanced codes [employer], states wheelchair professionals needs to re-submit with the correct codes  List of codes they did [place] are Z0092, Z3007, M2263, F3545, G2563, S9373, E1028  I called Wheelchair professionals and left a message

## 2013-09-25 ENCOUNTER — Ambulatory Visit (HOSPITAL_COMMUNITY)
Admission: RE | Admit: 2013-09-25 | Discharge: 2013-09-25 | Disposition: A | Discharge status: Home / Self Care | Payer: Medicare Other | Source: Ambulatory Visit | Attending: Orthopedic Surgery | Admitting: Orthopedic Surgery

## 2013-09-25 DIAGNOSIS — M6281 Muscle weakness (generalized): Secondary | ICD-10-CM | POA: Insufficient documentation

## 2013-09-25 DIAGNOSIS — M7502 Adhesive capsulitis of left shoulder: Secondary | ICD-10-CM

## 2013-09-25 DIAGNOSIS — M25519 Pain in unspecified shoulder: Secondary | ICD-10-CM | POA: Insufficient documentation

## 2013-09-25 DIAGNOSIS — E119 Type 2 diabetes mellitus without complications: Secondary | ICD-10-CM | POA: Insufficient documentation

## 2013-09-25 DIAGNOSIS — G8929 Other chronic pain: Secondary | ICD-10-CM

## 2013-09-25 DIAGNOSIS — I10 Essential (primary) hypertension: Secondary | ICD-10-CM | POA: Insufficient documentation

## 2013-09-25 DIAGNOSIS — IMO0001 Reserved for inherently not codable concepts without codable children: Secondary | ICD-10-CM | POA: Insufficient documentation

## 2013-09-25 NOTE — Evaluation (Signed)
Occupational Therapy Evaluation  Patient Details  Name: DESTINY TRICKEY MRN: [id number] Date of Birth: 09-28-1946  Today's Date: 09/25/2013 Time: 2505-3976 OT Time Calculation (min): 40 min OT Evaluation 20' 1105-1125' Manual Therapy 20' 1125-1145  Visit#: 1 of 12  Re-eval: 09/25/13  Assessment Diagnosis: Left Adhesive Capsulitis Next MD Visit: unknown Prior Therapy: home health OT  Authorization: [facility] Medicare  Authorization Time Period: before 10th visit  Authorization Visit#: 1 of 10   Past Medical History:  Past Medical History  Diagnosis Date  . Barrett's esophagus     EGD 03/23/2011 & EGD 2/09 bx proven  . Steatohepatitis     liver biopsy 2009  . DM (diabetes mellitus)   . Diverticulosis     TCS 03/23/11 pancolonic diverticula &TCS 5/08, pancolonic diverticula  . GERD (gastroesophageal reflux disease)   . HTN (hypertension)   . CVA (cerebral infarction) 1998    right sided deficit  . Gout   . Lymphoma 1974    XRT at [facility], right base of skull area  . Hepatitis     esosiniphilic, tx with prednisone  . Renal insufficiency   . 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 Hiatal hernia   . Colon polyp 03/23/2011    tubular adenoma, Dr. Gala Romney  . Hemorrhoids, internal 03/23/2011    tcs by Dr. Gala Romney  . Hyperlipidemia   . Hemorrhagic colitis 06/06/2012.  . Glaucoma (increased eye pressure)   . Arthritis   . Asthma     "hx of"  . 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 Lower facial weakness     Right  . Esotropia of left eye   . Edema of lower extremity 12/21/12    bilateral   . BPH (benign prostatic hyperplasia)   . Hx of radiation therapy 1974    right base of skull area-lymphoma  . Stroke 1998    right hemiparesis/plegia  . Chronic abdominal pain    Past Surgical History:  Past Surgical History  Procedure Laterality Date  . Cholecystectomy    . Right lymph node removal    . Right video-assisted thoracic surgery,  pleurectomy, and pleurodesis  2011  . Esophagogastroduodenoscopy  02/05/08    goblet cell metaplasia/negative for H.pylori  . Colonoscopy  03/23/11    Dr. Gala Romney  pancolonic diverticula, hemorrhoids, tubular adenoma.. next tcs 03/2016  . Esophagogastroduodenoscopy  03/23/11    Dr. Gala Romney, barretts, hiatal hernia  . Mass biopsy  11/01/2012    Procedure: NECK MASS BIOPSY;  Surgeon: Ascencion Dike, MD;  Location: [facility];  Service: ENT;  Laterality: Right;  Excisional Bx Right Neck Mass; attempted external jugular cutdown of left side  . Parotidectomy  11/24/2012    Procedure: PAROTIDECTOMY;  Surgeon: Ascencion Dike, MD;  Location: [place];  Service: ENT;  Laterality: N/A;  Total parotidectomy  . Pleurectomy      Subjective Symptoms/Limitations Symptoms: S:  I want to be able to get my own shirts on and off again. Pertinent History: Mr. Hepburn has been experiencing increased pain and mobility limitations in his left shoulder for at least 6 months.  He consulted with Dr. Aline Brochure, who referred him to home health therapy.  He has completed his home health therapy and is now referred to occupational therapy for evaluation and treatment.  He has no functional use of his RUE due to hemiparesis from a CVA in 1998. Special Tests: FOTO scored 27/100 Patient Stated Goals:  I want to decrease pain and increase mobiity Pain Assessment Currently in Pain?: Yes Pain Score: 9  Pain Location: Shoulder Pain Orientation: Right;Left Pain Type: Chronic pain  Precautions/Restrictions  Precautions Precautions: Fall Restrictions Weight Bearing Restrictions: No  Balance Screening Balance Screen Has the patient fallen in the past 6 months: No Has the patient had a decrease in activity level because of a fear of falling? : No Is the patient reluctant to leave their home because of a fear of falling? : No  Prior Functioning  Home Living Additional Comments: lives with his wife.  Prior Function Level of Independence: Needs  assistance with tranfers;Needs assistance with gait;Needs assistance with ADLs;Needs assistance with homemaking  Able to Take Stairs?: No Driving: No Vocation: Retired Comments: enjoys watching television  Assessment ADL/Vision/Perception ADL ADL Comments: He requires assistance with donning and doffing shirts due to increased shoulder pain.  He requires mod-max assist with bathing and grooming.  He is able to toilet independently.  Dominant Hand: Left Vision - History Baseline Vision:  (left eye is non functional)  Cognition/Observation Cognition Overall Cognitive Status: Within Functional Limits for tasks assessed Observation/Other Assessments Observations: max crepitis noted with left shoulder A/PROM  Sensation/Coordination/Edema Sensation Light Touch: Appears Intact Coordination Gross Motor Movements are Fluid and Coordinated: Yes Fine Motor Movements are Fluid and Coordinated: Yes  Additional Assessments LUE AROM (degrees) LUE Overall AROM Comments: assessed in seated ER/IR with shoulder adducted Left Shoulder Flexion: 80 Degrees Left Shoulder ABduction: 100 Degrees Left Shoulder Internal Rotation: 62 Degrees Left Shoulder External Rotation: 30 Degrees LUE PROM (degrees) LUE Overall PROM Comments: assessed in seated ER/IR with shoulder adducted Left Shoulder Flexion: 100 Degrees Left Shoulder ABduction: 100 Degrees Left Shoulder Internal Rotation: 62 Degrees Left Shoulder External Rotation: 30 Degrees LUE Strength Left Shoulder Flexion: 3+/5 Left Shoulder ABduction: 3+/5 Left Shoulder Internal Rotation: 3+/5 Left Shoulder External Rotation: 3+/5 Palpation Palpation: mod-max fascial restrictions in shoulder and scapular region.       Exercise/Treatments    Manual Therapy Manual Therapy: Myofascial release Myofascial Release: MFR and massage to left shoulder region, upper arm, and scapular region this date to decrease pain and restrictions and improve pain free  mobiilty.  Completed in seated, per patient request.   Occupational Therapy Assessment and Plan OT Assessment and Plan Clinical Impression Statement: A:  Patient presents with increased pain and restrictions in his left shoulder region, causing decreased independence with his BADLs. Patient will benefit from skilled OT intervention to decrease pain and restrictions and improve pain free mobilty.   Pt will benefit from skilled therapeutic intervention in order to improve on the following deficits: Decreased range of motion;Decreased strength;Increased muscle spasms;Increased fascial restricitons;Pain Rehab Potential: Good OT Frequency: Min 2X/week OT Duration: 6 weeks OT Treatment/Interventions: Self-care/ADL training;Therapeutic exercise;Modalities;Manual therapy;Therapeutic activities;Patient/family education OT Plan: P:  Skilled OT intervention to decrease pain and fascial restrictions and increase pain free moblity in his left shoulder needed to return to prior level of independence with all B/IADLs.  Treatment Plan:  Manual therapy seated, per patient request.  PROM and AAROM in seated, ball stretches, elev, ext, row, progress as tolerated.   Goals Short Term Goals Time to Complete Short Term Goals: 3 weeks Short Term Goal 1: Patient will be educated on a HEP. Short Term Goal 2: Patient will increase PROM by 10 degrees in his left shoulder for greater independence donning and doffing shirts.  Short Term Goal 3: Patient will increase left shoulder strength to 4-/5 for greater ability  to assist with transfers.  Short Term Goal 4: Patient will decrease pain in his left shoulder to 6/10 with functional activities.  Short Term Goal 5: Patient will decrease fascial restrictions to moderate in his left shoulder region.  Long Term Goals Time to Complete Long Term Goals: 6 weeks Long Term Goal 1: Patient will return to prior level of function with his daily activities.  Long Term Goal 2: Patient  will increase AROM by 15 degrees in his left shoulder for greater independence donning and doffing shirts.  Long Term Goal 3: Patient will increase left shoulder strength to 4/5 for greater ability to assist with transfers.  Long Term Goal 4: Patient will decrease pain in his left shoulder to 3/10 with functional activities.  Long Term Goal 5: Patient will decrease fascial restrictions to moderate in his left shoulder region.   Problem List Patient Active Problem List   Diagnosis Date Noted  . Chronic left shoulder pain 09/25/2013  . Muscle weakness (generalized) 09/25/2013  . Urosepsis 08/08/2013  . Hypercalcemia 08/08/2013  . CKD (chronic kidney disease), stage III 08/07/2013  . Leukocytosis 07/24/2013  . Hypotension 07/24/2013  . Adhesive capsulitis of left shoulder 05/14/2013  . Pain in joint, shoulder region 05/14/2013  . Adhesive capsulitis of shoulder 05/14/2013  . Shoulder pain, bilateral 12/28/2012  . Steatohepatitis   . Diverticulosis   . GERD (gastroesophageal reflux disease)   . CVA (cerebral infarction)   . Lymphoma   . Stroke   . Hiatal hernia   . Hyperlipidemia   . Hemorrhagic colitis   . Glaucoma (increased eye pressure)   . Asthma   . Cancer of parotid gland   . Lower facial weakness   . Esotropia of left eye   . BPH (benign prostatic hyperplasia)   . Hx of radiation therapy   . Edema of lower extremity 12/21/2012  . Parotid gland adenocarcinoma 11/23/2012  . Epidermoid cyst 10/05/2012  . Neck mass 10/05/2012  . Neck pain 08/26/2012  . Chronic abdominal pain 08/26/2012  . Leg edema 08/24/2012  . Colitis 06/06/2012  . Hemiplegia affecting dominant side, late effect of cerebrovascular disease 06/06/2012  . Diarrhea 04/17/2012  . Obesity 04/17/2012  . Diabetic neuropathy 02/13/2012  . Recurrent boils 01/12/2012  . 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 Hemorrhoids, internal 03/23/2011  . Hepatitis 03/02/2011  . EOSINOPHILIA  12/23/2009  . HEMATOCHEZIA 12/23/2009  . RUQ PAIN 12/23/2009  . DM 01/05/2009  . Gout, unspecified 01/05/2009  . HYPERTENSION 01/05/2009  . CVA 01/05/2009  . BARRETTS ESOPHAGUS 01/05/2009  . DIVERTICULOSIS OF COLON 01/05/2009  . LYMPHOMA, HX OF 01/05/2009    End of Session Activity Tolerance: Patient tolerated treatment well General Behavior During Therapy: [facility] for tasks assessed/performed OT Plan of Care OT Home Exercise Plan: towel slides modified for LUE only or with wife assisting  Consulted and Agree with Plan of Care: Patient;Family member/caregiver Family Member Consulted: wife  GO Functional Assessment Tool Used: FOTO shoulder - 27/100 Functional Limitation: Self care Self Care Current Status [phone]): At least 60 percent but less than 80 percent impaired, limited or restricted Self Care Goal Status ([id number]): At least 40 percent but less than 60 percent impaired, limited or restricted  Vangie Bicker, OTR/L  09/25/2013, 12:25 PM  Physician Documentation Your signature is required to indicate approval of the treatment plan as stated above.  Please sign and either send electronically or make a copy of this report for your  files and return this physician signed original.  Please mark one 1.__approve of plan  2. ___approve of plan with the following conditions.   ______________________________                                                          _____________________ Physician Signature                                                                                                             Date

## 2013-09-26 ENCOUNTER — Ambulatory Visit (INDEPENDENT_AMBULATORY_CARE_PROVIDER_SITE_OTHER): Payer: Medicare Other | Admitting: Otolaryngology

## 2013-09-27 ENCOUNTER — Ambulatory Visit (HOSPITAL_COMMUNITY)
Admission: RE | Admit: 2013-09-27 | Discharge: 2013-09-27 | Disposition: A | Discharge status: Home / Self Care | Payer: Medicare Other | Source: Ambulatory Visit | Attending: Family Medicine | Admitting: Family Medicine

## 2013-09-27 DIAGNOSIS — G8929 Other chronic pain: Secondary | ICD-10-CM

## 2013-09-27 DIAGNOSIS — M6281 Muscle weakness (generalized): Secondary | ICD-10-CM

## 2013-09-27 NOTE — Progress Notes (Signed)
Occupational Therapy Treatment Patient Details  Name: [name] MRN: [id number] Date of Birth: March 24, 1946  Today's Date: 09/27/2013 Time: 8527-7824 OT Time Calculation (min): 28 min Manual therapy [phone] 10' Therapeutic exercises [phone] 61' Visit#: 2 of 12  Re-eval: 10/23/13    Authorization: [facility] Medicare  Authorization Time Period: before 10th visit  Authorization Visit#: 2 of 10  Subjective S:  We tried the exercises, my wife helped me.  Pain Assessment Currently in Pain?: Yes Pain Score: 7  Pain Location: Shoulder Pain Orientation: Left Pain Type: Chronic pain  Precautions/Restrictions   fall risk  Exercise/Treatments Seated Elevation: AROM;10 reps Extension: AROM;10 reps Row: AROM;10 reps Protraction: PROM;AAROM;10 reps Horizontal ABduction: PROM;AAROM;10 reps External Rotation: PROM;AAROM;10 reps Internal Rotation: PROM;AAROM;10 reps Flexion: PROM;AAROM;10 reps Abduction: PROM;AAROM;10 reps   ROM / Strengthening / Isometric Strengthening Other ROM/Strengthening Exercises: attempted to do cone activity to work on abduction, however patient requested that we conclude therapy for the day.   Stretches Table Stretch - Flexion:  (15 reps with hand over hand assist) Table Stretch - Abduction:  (15 times with hand over hand assist) Table Stretch - External Rotation:  (15 times with hand over hand assist )    Manual Therapy Manual Therapy: Myofascial release Myofascial Release: MFR and massage to left shoulder region, upper arm, and scapular region this date to decrease pain and restrictions and improve pain free mobiilty. Completed in seated, per patient request.  Occupational Therapy Assessment and Plan OT Assessment and Plan Clinical Impression Statement: A:  Less crepitis with PROM and AAROM, however, with functional activities crepitis worsened and patient requested we discontinue treatment for the day.  OT Plan: P:  Attempt to complete functional  activities with less pain.     Goals Short Term Goals Time to Complete Short Term Goals: 3 weeks Short Term Goal 1: Patient will be educated on a HEP. Short Term Goal 1 Progress: Progressing toward goal Short Term Goal 2: Patient will increase PROM by 10 degrees in his left shoulder for greater independence donning and doffing shirts.  Short Term Goal 2 Progress: Progressing toward goal Short Term Goal 3: Patient will increase left shoulder strength to 4-/5 for greater ability to assist with transfers.  Short Term Goal 3 Progress: Progressing toward goal Short Term Goal 4: Patient will decrease pain in his left shoulder to 6/10 with functional activities.  Short Term Goal 4 Progress: Progressing toward goal Short Term Goal 5: Patient will decrease fascial restrictions to moderate in his left shoulder region.  Short Term Goal 5 Progress: Progressing toward goal Long Term Goals Time to Complete Long Term Goals: 6 weeks Long Term Goal 1: Patient will return to prior level of function with his daily activities.  Long Term Goal 1 Progress: Progressing toward goal Long Term Goal 2: Patient will increase AROM by 15 degrees in his left shoulder for greater independence donning and doffing shirts.  Long Term Goal 2 Progress: Progressing toward goal Long Term Goal 3: Patient will increase left shoulder strength to 4/5 for greater ability to assist with transfers.  Long Term Goal 3 Progress: Progressing toward goal Long Term Goal 4: Patient will decrease pain in his left shoulder to 3/10 with functional activities.  Long Term Goal 4 Progress: Progressing toward goal Long Term Goal 5: Patient will decrease fascial restrictions to moderate in his left shoulder region.  Long Term Goal 5 Progress: Progressing toward goal  Problem List Patient Active Problem List   Diagnosis Date Noted  .  Chronic left shoulder pain 09/25/2013  . Muscle weakness (generalized) 09/25/2013  . Urosepsis 08/08/2013  .  Hypercalcemia 08/08/2013  . CKD (chronic kidney disease), stage III 08/07/2013  . Leukocytosis 07/24/2013  . Hypotension 07/24/2013  . Adhesive capsulitis of left shoulder 05/14/2013  . Pain in joint, shoulder region 05/14/2013  . Adhesive capsulitis of shoulder 05/14/2013  . Shoulder pain, bilateral 12/28/2012  . Steatohepatitis   . Diverticulosis   . GERD (gastroesophageal reflux disease)   . CVA (cerebral infarction)   . Lymphoma   . Stroke   . Hiatal hernia   . Hyperlipidemia   . Hemorrhagic colitis   . Glaucoma (increased eye pressure)   . Asthma   . Cancer of parotid gland   . Lower facial weakness   . Esotropia of left eye   . BPH (benign prostatic hyperplasia)   . Hx of radiation therapy   . Edema of lower extremity 12/21/2012  . Parotid gland adenocarcinoma 11/23/2012  . Epidermoid cyst 10/05/2012  . Neck mass 10/05/2012  . Neck pain 08/26/2012  . Chronic abdominal pain 08/26/2012  . Leg edema 08/24/2012  . Colitis 06/06/2012  . Hemiplegia affecting dominant side, late effect of cerebrovascular disease 06/06/2012  . Diarrhea 04/17/2012  . Obesity 04/17/2012  . Diabetic neuropathy 02/13/2012  . Recurrent boils 01/12/2012  . 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 Hemorrhoids, internal 03/23/2011  . Hepatitis 03/02/2011  . EOSINOPHILIA 12/23/2009  . HEMATOCHEZIA 12/23/2009  . RUQ PAIN 12/23/2009  . DM 01/05/2009  . Gout, unspecified 01/05/2009  . HYPERTENSION 01/05/2009  . CVA 01/05/2009  . BARRETTS ESOPHAGUS 01/05/2009  . DIVERTICULOSIS OF COLON 01/05/2009  . LYMPHOMA, HX OF 01/05/2009    End of Session Activity Tolerance: Patient tolerated treatment well General Behavior During Therapy: [facility] for tasks assessed/performed  [place], OTR/L  09/27/2013, 9:33 AM

## 2013-10-02 ENCOUNTER — Ambulatory Visit: Payer: Medicare Other | Admitting: Family Medicine

## 2013-10-02 ENCOUNTER — Ambulatory Visit (HOSPITAL_COMMUNITY)
Admission: RE | Admit: 2013-10-02 | Discharge: 2013-10-02 | Disposition: A | Discharge status: Home / Self Care | Payer: Medicare Other | Source: Ambulatory Visit | Attending: Family Medicine | Admitting: Family Medicine

## 2013-10-02 NOTE — Progress Notes (Signed)
Occupational Therapy Treatment Patient Details  Name: [name] MRN: [id number] Date of Birth: [birth year]  Today's Date: 10/02/2013 Time: 0946-     Visit#: 3 of 12  Re-eval: 10/23/13    Authorization: [facility] Medicare  Authorization Time Period: before 10th visit  Authorization Visit#: 3 of 10  Subjective  Patient with increased complaints of pain 8/10.  Increased with palpation and motion.  Unable to tolerate MFR today.      Precautions/Restrictions    As stated in eval  Exercise/Treatments Supine   Seated Elevation: 12 reps Extension: AROM;12 reps Row: AROM;12 reps Protraction: PROM;AAROM;12 reps Horizontal ABduction: PROM;AAROM;12 reps External Rotation: PROM;AAROM;12 reps Internal Rotation: PROM;AAROM;12 reps Flexion: PROM;AAROM;12 reps Abduction: PROM;AAROM;12 reps Prone    Sidelying   Standing   Pulleys   Therapy Ball   ROM / Strengthening / Isometric Strengthening Other ROM/Strengthening Exercises: attempted AAROM with dowel, pt declined due to right UE hemiparesis   Stretches   Power Educational psychologist              Modalities Modalities: Cryotherapy Manual Therapy Manual Therapy: Other (comment) Myofascial Release: attemped and pt unable to tolerate due to increased pain  Occupational Therapy Assessment and Plan OT Assessment and Plan Clinical Impression Statement: Paient with increased point tenderness at anterior shoulder, with increased noted with palpation and motion.  Did well will with muscle energy exercises for IR/ER, ABD/ADD seated with elbow at 90 degrees flexion.  Pt tolerated and reported decrease in pain with ice 5/10.  Begain at 8 of 10.   Rehab Potential: Fair OT Treatment/Interventions: Self-care/ADL training;Therapeutic exercise;Neuromuscular education;Manual therapy;Modalities;Therapeutic activities OT Plan: P:  Attempt to complete functional activities with less pain.     Goals Home Exercise Program PT Goal:  Perform Home Exercise Program - Progress: Progressing toward goal  Problem List Patient Active Problem List   Diagnosis Date Noted  . Chronic left shoulder pain 09/25/2013  . Muscle weakness (generalized) 09/25/2013  . Urosepsis 08/08/2013  . Hypercalcemia 08/08/2013  . CKD (chronic kidney disease), stage III 08/07/2013  . Leukocytosis 07/24/2013  . Hypotension 07/24/2013  . Adhesive capsulitis of left shoulder 05/14/2013  . Pain in joint, shoulder region 05/14/2013  . Adhesive capsulitis of shoulder 05/14/2013  . Shoulder pain, bilateral 12/28/2012  . Steatohepatitis   . Diverticulosis   . GERD (gastroesophageal reflux disease)   . CVA (cerebral infarction)   . Lymphoma   . Stroke   . Hiatal hernia   . Hyperlipidemia   . Hemorrhagic colitis   . Glaucoma (increased eye pressure)   . Asthma   . Cancer of parotid gland   . Lower facial weakness   . Esotropia of left eye   . BPH (benign prostatic hyperplasia)   . Hx of radiation therapy   . Edema of lower extremity 12/21/2012  . Parotid gland adenocarcinoma 11/23/2012  . Epidermoid cyst 10/05/2012  . Neck mass 10/05/2012  . Neck pain 08/26/2012  . Chronic abdominal pain 08/26/2012  . Leg edema 08/24/2012  . Colitis 06/06/2012  . Hemiplegia affecting dominant side, late effect of cerebrovascular disease 06/06/2012  . Diarrhea 04/17/2012  . Obesity 04/17/2012  . Diabetic neuropathy 02/13/2012  . Recurrent boils 01/12/2012  . 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 Hemorrhoids, internal 03/23/2011  . Hepatitis 03/02/2011  . EOSINOPHILIA 12/23/2009  . HEMATOCHEZIA 12/23/2009  . RUQ PAIN 12/23/2009  . DM 01/05/2009  . Gout, unspecified 01/05/2009  . HYPERTENSION 01/05/2009  .  CVA 01/05/2009  . BARRETTS ESOPHAGUS 01/05/2009  . DIVERTICULOSIS OF COLON 01/05/2009  . LYMPHOMA, HX OF 01/05/2009    End of Session Activity Tolerance: Patient tolerated treatment well;Patient limited by  pain General Behavior During Therapy: WFL for tasks assessed/performed OT Plan of Care OT Home Exercise Plan: continue OT Patient Instructions: continue, educated caregivers to assess sleeping position, possible point of increased pain and edema for left anterior shoulder.    GO    Jahzaria Vary 10/02/2013, 10:12 AM

## 2013-10-04 ENCOUNTER — Ambulatory Visit (HOSPITAL_COMMUNITY)
Admission: RE | Admit: 2013-10-04 | Discharge: 2013-10-04 | Disposition: A | Discharge status: Home / Self Care | Payer: Medicare Other | Source: Ambulatory Visit | Attending: Family Medicine | Admitting: Family Medicine

## 2013-10-04 ENCOUNTER — Ambulatory Visit (HOSPITAL_COMMUNITY): Payer: Medicare Other

## 2013-10-04 NOTE — Progress Notes (Signed)
Occupational Therapy Treatment Patient Details  Name: [name] MRN: [id number] Date of Birth: 19-Apr-1946  Today's Date: 10/04/2013 Time: 0930-1000 OT Time Calculation (min): 30 min  Visit#: 4 of 12  Re-eval:      Authorization:    Authorization Time Period:    Authorization Visit#:   of    Subjective Pain Assessment Pain Score: 7  Pain Location: Shoulder Pain Orientation: Left Pain Type: Chronic pain  Precautions/Restrictions     Exercise/Treatments Supine   Seated Elevation: AROM;15 reps Extension: AROM;15 reps Row: AROM;15 reps Protraction: PROM Horizontal ABduction: PROM External Rotation: PROM Internal Rotation: PROM Flexion: PROM ( ) Abduction: PROM ( ) Prone    Sidelying   Standing   Pulleys   Therapy Ball Flexion: 20 reps (2 sets of 10) ABduction: 20 reps (2 set of 10 ) ROM / Strengthening / Isometric Strengthening     Stretches   Power Educational psychologist              Modalities Modalities: Cryotherapy  Occupational Therapy Assessment and Plan OT Assessment and Plan Clinical Impression Statement: Pt continues with increased pain with movement and palpation.  Tolerated AAROM exs fairly well today with increased breaks and touch ques to decrease compensation postures.  Crepitus noted with exercises.  Reported benefits of ice from last tx session and at home.   Pt will benefit from skilled therapeutic intervention in order to improve on the following deficits: Impaired UE functional use Rehab Potential: Fair OT Treatment/Interventions: Self-care/ADL training;Therapeutic exercise;Modalities   Goals    Problem List Patient Active Problem List   Diagnosis Date Noted  . Chronic left shoulder pain 09/25/2013  . Muscle weakness (generalized) 09/25/2013  . Urosepsis 08/08/2013  . Hypercalcemia 08/08/2013  . CKD (chronic kidney disease), stage III 08/07/2013  . Leukocytosis 07/24/2013  . Hypotension 07/24/2013  . Adhesive  capsulitis of left shoulder 05/14/2013  . Pain in joint, shoulder region 05/14/2013  . Adhesive capsulitis of shoulder 05/14/2013  . Shoulder pain, bilateral 12/28/2012  . Steatohepatitis   . Diverticulosis   . GERD (gastroesophageal reflux disease)   . CVA (cerebral infarction)   . Lymphoma   . Stroke   . Hiatal hernia   . Hyperlipidemia   . Hemorrhagic colitis   . Glaucoma (increased eye pressure)   . Asthma   . Cancer of parotid gland   . Lower facial weakness   . Esotropia of left eye   . BPH (benign prostatic hyperplasia)   . Hx of radiation therapy   . Edema of lower extremity 12/21/2012  . Parotid gland adenocarcinoma 11/23/2012  . Epidermoid cyst 10/05/2012  . Neck mass 10/05/2012  . Neck pain 08/26/2012  . Chronic abdominal pain 08/26/2012  . Leg edema 08/24/2012  . Colitis 06/06/2012  . Hemiplegia affecting dominant side, late effect of cerebrovascular disease 06/06/2012  . Diarrhea 04/17/2012  . Obesity 04/17/2012  . Diabetic neuropathy 02/13/2012  . Recurrent boils 01/12/2012  . 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 Hemorrhoids, internal 03/23/2011  . Hepatitis 03/02/2011  . EOSINOPHILIA 12/23/2009  . HEMATOCHEZIA 12/23/2009  . RUQ PAIN 12/23/2009  . DM 01/05/2009  . Gout, unspecified 01/05/2009  . HYPERTENSION 01/05/2009  . CVA 01/05/2009  . BARRETTS ESOPHAGUS 01/05/2009  . DIVERTICULOSIS OF COLON 01/05/2009  . LYMPHOMA, HX OF 01/05/2009    End of Session Activity Tolerance: Patient limited by fatigue;Patient limited by pain OT Plan of Care OT Home Exercise  Plan: continue OT Patient Instructions: continue with ice if helping with decreasing resting pain.    GO    [facility] 10/04/2013, 9:57 AM

## 2013-10-08 ENCOUNTER — Ambulatory Visit (HOSPITAL_COMMUNITY)
Admission: RE | Admit: 2013-10-08 | Discharge: 2013-10-08 | Disposition: A | Discharge status: Home / Self Care | Payer: Medicare Other | Source: Ambulatory Visit | Attending: Family Medicine | Admitting: Family Medicine

## 2013-10-09 ENCOUNTER — Ambulatory Visit (INDEPENDENT_AMBULATORY_CARE_PROVIDER_SITE_OTHER): Payer: Medicare Other | Admitting: Family Medicine

## 2013-10-09 ENCOUNTER — Encounter: Payer: Self-pay | Admitting: Family Medicine

## 2013-10-09 VITALS — BP 124/68 | HR 78 | Temp 98.3°F | Resp 18

## 2013-10-09 DIAGNOSIS — R109 Unspecified abdominal pain: Secondary | ICD-10-CM

## 2013-10-09 DIAGNOSIS — L304 Erythema intertrigo: Secondary | ICD-10-CM

## 2013-10-09 DIAGNOSIS — R609 Edema, unspecified: Secondary | ICD-10-CM

## 2013-10-09 DIAGNOSIS — L538 Other specified erythematous conditions: Secondary | ICD-10-CM

## 2013-10-09 DIAGNOSIS — G8929 Other chronic pain: Secondary | ICD-10-CM

## 2013-10-09 DIAGNOSIS — Z23 Encounter for immunization: Secondary | ICD-10-CM

## 2013-10-09 DIAGNOSIS — I10 Essential (primary) hypertension: Secondary | ICD-10-CM

## 2013-10-09 DIAGNOSIS — R600 Localized edema: Secondary | ICD-10-CM

## 2013-10-09 MED ORDER — OXYCODONE-ACETAMINOPHEN 5-325 MG PO TABS: ORAL_TABLET | ORAL | Status: DC

## 2013-10-09 NOTE — Patient Instructions (Signed)
I will f/u on Chair  I will check calcium level Flu shot  Pain medication refilled  F/U 4 months

## 2013-10-10 ENCOUNTER — Ambulatory Visit (HOSPITAL_COMMUNITY)
Admission: RE | Admit: 2013-10-10 | Discharge: 2013-10-10 | Disposition: A | Discharge status: Home / Self Care | Payer: Medicare Other | Source: Ambulatory Visit | Attending: Orthopedic Surgery | Admitting: Orthopedic Surgery

## 2013-10-10 NOTE — Evaluation (Addendum)
Occupational Therapy Progress Note Patient Details  Name: [name] MRN: [id number] Date of Birth: 03-10-1946  Today's Date: 10/08/2013 Time: 1350-1430 OT Time Calculation (min): 40 min Manual 4098-1191 (25') TherExercise 1415-1430 (15')  Visit#: 5 of 12  Re-eval: 10/23/13     Authorization: [facility] Medicare  Authorization Time Period: before 10th visit  Authorization Visit#: 5 of 10   Past Medical History:  Past Medical History  Diagnosis Date  . Barrett's esophagus     EGD 03/23/2011 & EGD 2/09 bx proven  . Steatohepatitis     liver biopsy 2009  . DM (diabetes mellitus)   . Diverticulosis     TCS 03/23/11 pancolonic diverticula &TCS 5/08, pancolonic diverticula  . GERD (gastroesophageal reflux disease)   . HTN (hypertension)   . CVA (cerebral infarction) 1998    right sided deficit  . Gout   . Lymphoma 1974    XRT at [facility], right base of skull area  . Hepatitis     esosiniphilic, tx with prednisone  . Renal insufficiency   . 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 Hiatal hernia   . Colon polyp 03/23/2011    tubular adenoma, Dr. Gala Romney  . Hemorrhoids, internal 03/23/2011    tcs by Dr. Gala Romney  . Hyperlipidemia   . Hemorrhagic colitis 06/06/2012.  . Glaucoma (increased eye pressure)   . Arthritis   . Asthma     "hx of"  . 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 Lower facial weakness     Right  . Esotropia of left eye   . Edema of lower extremity 12/21/12    bilateral   . BPH (benign prostatic hyperplasia)   . Hx of radiation therapy 1974    right base of skull area-lymphoma  . Stroke 1998    right hemiparesis/plegia  . Chronic abdominal pain    Past Surgical History:  Past Surgical History  Procedure Laterality Date  . Cholecystectomy    . Right lymph node removal    . Right video-assisted thoracic surgery, pleurectomy, and pleurodesis  2011  . Esophagogastroduodenoscopy  02/05/08    goblet cell metaplasia/negative for  H.pylori  . Colonoscopy  03/23/11    Dr. Gala Romney  pancolonic diverticula, hemorrhoids, tubular adenoma.. next tcs 03/2016  . Esophagogastroduodenoscopy  03/23/11    Dr. Gala Romney, barretts, hiatal hernia  . Mass biopsy  11/01/2012    Procedure: NECK MASS BIOPSY;  Surgeon: Ascencion Dike, MD;  Location: [facility];  Service: ENT;  Laterality: Right;  Excisional Bx Right Neck Mass; attempted external jugular cutdown of left side  . Parotidectomy  11/24/2012    Procedure: PAROTIDECTOMY;  Surgeon: Ascencion Dike, MD;  Location: [place];  Service: ENT;  Laterality: N/A;  Total parotidectomy  . Pleurectomy      Subjective Symptoms/Limitations Symptoms: S: I am doing my exercises at home and my wife has been helping me  Pain Assessment Currently in Pain?: Yes Pain Score: 7  Pain Location: Shoulder Pain Orientation: Left Pain Type: Acute pain;Chronic pain Multiple Pain Sites: No     Assessment Patient  presents with cont complaint of pain and inflammation in Left shoulder.       Exercise/Treatments    Seated Elevation: AROM;15 reps Extension: AROM;15 reps Row: AROM;15 reps Horizontal ABduction: PROM;10 reps External Rotation: PROM;10 reps Internal Rotation: PROM;10 reps Flexion: PROM;10 reps Abduction: PROM;10 reps           Manual  Therapy Manual Therapy: Myofascial release Myofascial Release: MFR and massage to left shoulder region, upper arm, and scapular region this date to decrease pain and restrictions and improve pain free mobiilty. Completed in seated, per patient request.  Occupational Therapy Assessment and Plan OT Assessment and Plan Clinical Impression Statement: A: Patient continues with increased complaints of pain, stiffness and lack of functional use of LUE.  His complaint of pain at end of session, decreased to 3/10 and declined ice at end of treatment.  encouraged to utilize at home as needed and continue with stretching HEP  Rehab Potential: Fair OT Plan: P:  Attempt to  complete functional activities with less pain.     Goals Short Term Goals Short Term Goal 1: Patient will be educated on a HEP. Short Term Goal 1 Progress: Progressing toward goal Short Term Goal 2: Patient will increase PROM by 10 degrees in his left shoulder for greater independence donning and doffing shirts.  Short Term Goal 2 Progress: Progressing toward goal Short Term Goal 3: Patient will increase left shoulder strength to 4-/5 for greater ability to assist with transfers.  Short Term Goal 3 Progress: Progressing toward goal Short Term Goal 4: Patient will decrease pain in his left shoulder to 6/10 with functional activities.  Short Term Goal 4 Progress: Progressing toward goal Short Term Goal 5: Patient will decrease fascial restrictions to moderate in his left shoulder region.  Short Term Goal 5 Progress: Progressing toward goal Long Term Goals Long Term Goal 1: Patient will return to prior level of function with his daily activities.  Long Term Goal 1 Progress: Progressing toward goal Long Term Goal 2: Patient will increase AROM by 15 degrees in his left shoulder for greater independence donning and doffing shirts.  Long Term Goal 2 Progress: Progressing toward goal Long Term Goal 3: Patient will increase left shoulder strength to 4/5 for greater ability to assist with transfers.  Long Term Goal 3 Progress: Progressing toward goal Long Term Goal 4: Patient will decrease pain in his left shoulder to 3/10 with functional activities.  Long Term Goal 4 Progress: Progressing toward goal Long Term Goal 5: Patient will decrease fascial restrictions to moderate in his left shoulder region.  Long Term Goal 5 Progress: Progressing toward goal  Problem List Patient Active Problem List   Diagnosis Date Noted  . Chronic left shoulder pain 09/25/2013  . Muscle weakness (generalized) 09/25/2013  . Urosepsis 08/08/2013  . Hypercalcemia 08/08/2013  . CKD (chronic kidney disease), stage III  08/07/2013  . Leukocytosis 07/24/2013  . Hypotension 07/24/2013  . Adhesive capsulitis of left shoulder 05/14/2013  . Pain in joint, shoulder region 05/14/2013  . Adhesive capsulitis of shoulder 05/14/2013  . Shoulder pain, bilateral 12/28/2012  . Steatohepatitis   . Diverticulosis   . GERD (gastroesophageal reflux disease)   . CVA (cerebral infarction)   . Lymphoma   . Stroke   . Hiatal hernia   . Hyperlipidemia   . Hemorrhagic colitis   . Glaucoma (increased eye pressure)   . Asthma   . Cancer of parotid gland   . Lower facial weakness   . Esotropia of left eye   . BPH (benign prostatic hyperplasia)   . Hx of radiation therapy   . Edema of lower extremity 12/21/2012  . Parotid gland adenocarcinoma 11/23/2012  . Epidermoid cyst 10/05/2012  . Neck mass 10/05/2012  . Neck pain 08/26/2012  . Chronic abdominal pain 08/26/2012  . Leg edema 08/24/2012  . Colitis  06/06/2012  . Hemiplegia affecting dominant side, late effect of cerebrovascular disease 06/06/2012  . Diarrhea 04/17/2012  . Obesity 04/17/2012  . Diabetic neuropathy 02/13/2012  . Recurrent boils 01/12/2012  . 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 Hemorrhoids, internal 03/23/2011  . Hepatitis 03/02/2011  . EOSINOPHILIA 12/23/2009  . HEMATOCHEZIA 12/23/2009  . RUQ PAIN 12/23/2009  . DM 01/05/2009  . Gout, unspecified 01/05/2009  . HYPERTENSION 01/05/2009  . CVA 01/05/2009  . BARRETTS ESOPHAGUS 01/05/2009  . DIVERTICULOSIS OF COLON 01/05/2009  . LYMPHOMA, HX OF 01/05/2009    End of Session Activity Tolerance: Patient tolerated treatment well General Behavior During Therapy: [facility] for tasks assessed/performed  GO    Donney Rankins, OTR/L  10/09/2013, 12:29 AM  Physician Documentation Your signature is required to indicate approval of the treatment plan as stated above.  Please sign and either send electronically or make a copy of this report for your files and return this  physician signed original.  Please mark one 1.__approve of plan  2. ___approve of plan with the following conditions.   ______________________________                                                          _____________________ Physician Signature                                                                                                             Date

## 2013-10-10 NOTE — Progress Notes (Signed)
Occupational Therapy Treatment Patient Details  Name: [name] MRN: [id number] Date of Birth: [birth year]  Today's Date: 10/10/2013 Time: 1518-1600 OT Time Calculation (min): 42 min  Manual 1518-1535 (17') TherExercise 1535-1600 (25')  Visit#: 6 of 12  Re-eval: 10/23/13    Authorization: [employer] Medicare  Authorization Time Period: before 10th visit  Authorization Visit#: 6 of 10  Subjective Symptoms/Limitations Symptoms: S: I am doing my exercises at home and my wife has been helping me  Pain Assessment Currently in Pain?: Yes Pain Score: 7  Pain Location: Shoulder Pain Orientation: Left Pain Type: Acute pain;Chronic pain Multiple Pain Sites: No   Exercise/Treatments   Seated Elevation: AROM;15 reps Extension: AROM;15 reps Row: AROM;15 reps Horizontal ABduction: PROM;10 reps External Rotation: PROM;10 reps Internal Rotation: PROM;10 reps Flexion: PROM;10 reps;AAROM;12 reps Abduction: PROM;10 reps;AAROM;12 reps Other Seated Exercises: clothes pin activitiy reaching  - 14 on vertical and 10 on horizontal and removing  Pulleys Flexion: 2 minutes (utilized LUE and therapist utilized other pull for range ) ABduction: 2 minutes (utilized LUE and therapist other pull for range )  Manual Myofascial Release:  MFR and massage to left shoulder region, upper arm, and scapular region this date to decrease pain and restrictions and improve pain free mobiilty. Completed in seated, per patient request   Occupational Therapy Assessment and Plan OT Assessment and Plan Clinical Impression Statement: A:  initiated pulleys this date with therapist assistance for increased range to LUE wtih good tolerance.  Patient with decreased complaint of pain following treatment to 3/10 LUE.  Noted complensatory shoulder elevation with reaching tasks.  OT Plan: P:  Attempt to complete functional activities with less pain.     Goals Short Term Goals Short Term Goal 1: Patient will be educated  on a HEP. Short Term Goal 1 Progress: Progressing toward goal Short Term Goal 2: Patient will increase PROM by 10 degrees in his left shoulder for greater independence donning and doffing shirts.  Short Term Goal 2 Progress: Progressing toward goal Short Term Goal 3: Patient will increase left shoulder strength to 4-/5 for greater ability to assist with transfers.  Short Term Goal 3 Progress: Progressing toward goal Short Term Goal 4: Patient will decrease pain in his left shoulder to 6/10 with functional activities.  Short Term Goal 4 Progress: Progressing toward goal Short Term Goal 5: Patient will decrease fascial restrictions to moderate in his left shoulder region.  Short Term Goal 5 Progress: Progressing toward goal Long Term Goals Long Term Goal 1: Patient will return to prior level of function with his daily activities.  Long Term Goal 1 Progress: Progressing toward goal Long Term Goal 2: Patient will increase AROM by 15 degrees in his left shoulder for greater independence donning and doffing shirts.  Long Term Goal 2 Progress: Progressing toward goal Long Term Goal 3: Patient will increase left shoulder strength to 4/5 for greater ability to assist with transfers.  Long Term Goal 3 Progress: Progressing toward goal Long Term Goal 4: Patient will decrease pain in his left shoulder to 3/10 with functional activities.  Long Term Goal 4 Progress: Progressing toward goal Long Term Goal 5: Patient will decrease fascial restrictions to moderate in his left shoulder region.  Long Term Goal 5 Progress: Progressing toward goal  Problem List Patient Active Problem List   Diagnosis Date Noted  . Chronic left shoulder pain 09/25/2013  . Muscle weakness (generalized) 09/25/2013  . Urosepsis 08/08/2013  . Hypercalcemia 08/08/2013  . CKD (chronic kidney  disease), stage III 08/07/2013  . Leukocytosis 07/24/2013  . Hypotension 07/24/2013  . Adhesive capsulitis of left shoulder 05/14/2013  .  Pain in joint, shoulder region 05/14/2013  . Adhesive capsulitis of shoulder 05/14/2013  . Shoulder pain, bilateral 12/28/2012  . Steatohepatitis   . Diverticulosis   . GERD (gastroesophageal reflux disease)   . CVA (cerebral infarction)   . Lymphoma   . Stroke   . Hiatal hernia   . Hyperlipidemia   . Hemorrhagic colitis   . Glaucoma (increased eye pressure)   . Asthma   . Cancer of parotid gland   . Lower facial weakness   . Esotropia of left eye   . BPH (benign prostatic hyperplasia)   . Hx of radiation therapy   . Edema of lower extremity 12/21/2012  . Parotid gland adenocarcinoma 11/23/2012  . Epidermoid cyst 10/05/2012  . Neck mass 10/05/2012  . Neck pain 08/26/2012  . Chronic abdominal pain 08/26/2012  . Leg edema 08/24/2012  . Colitis 06/06/2012  . Hemiplegia affecting dominant side, late effect of cerebrovascular disease 06/06/2012  . Diarrhea 04/17/2012  . Obesity 04/17/2012  . Diabetic neuropathy 02/13/2012  . Recurrent boils 01/12/2012  . 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 Hemorrhoids, internal 03/23/2011  . Hepatitis 03/02/2011  . EOSINOPHILIA 12/23/2009  . HEMATOCHEZIA 12/23/2009  . RUQ PAIN 12/23/2009  . DM 01/05/2009  . Gout, unspecified 01/05/2009  . HYPERTENSION 01/05/2009  . CVA 01/05/2009  . BARRETTS ESOPHAGUS 01/05/2009  . DIVERTICULOSIS OF COLON 01/05/2009  . LYMPHOMA, HX OF 01/05/2009    End of Session Activity Tolerance: Patient tolerated treatment well General Behavior During Therapy: [facility] for tasks assessed/performed  GO    Donney Rankins, OTR/L  10/10/2013, 4:43 PM

## 2013-10-11 ENCOUNTER — Telehealth: Payer: Self-pay | Admitting: Family Medicine

## 2013-10-11 NOTE — Telephone Encounter (Signed)
I spoke with insurance company as well as [employer] and [employer] regarding denial of motorized wheelchair The [employer] companies to one estimated a claims to have a physical therapy evaluation. They are resubmitting so that he can get a different wheelchair at level II. I will followup in one week to make sure that this is been done

## 2013-10-12 ENCOUNTER — Encounter: Payer: Self-pay | Admitting: Family Medicine

## 2013-10-12 DIAGNOSIS — L304 Erythema intertrigo: Secondary | ICD-10-CM | POA: Insufficient documentation

## 2013-10-12 MED ORDER — NYSTATIN 100000 UNIT/GM EX POWD: CUTANEOUS | Status: DC

## 2013-10-12 NOTE — Assessment & Plan Note (Signed)
Apply nystatin powder

## 2013-10-12 NOTE — Assessment & Plan Note (Signed)
Pain meds refilled, unchanged

## 2013-10-12 NOTE — Assessment & Plan Note (Signed)
BMET ordered to re-evaluate calcium, unable to get from our lab , pt sent to [facility] outpatient lab in [place] they were also unable to draw blood As level was not severely elevated will wait until he sees endocrine when labs will be done

## 2013-10-12 NOTE — Assessment & Plan Note (Signed)
Improved with HCTZ back on

## 2013-10-12 NOTE — Assessment & Plan Note (Signed)
BP much improved

## 2013-10-12 NOTE — Progress Notes (Signed)
  Subjective:    Patient ID: [name], male    DOB: 09-Apr-1946, 67 y.o.   MRN: [id number]  HPI   Pt here to f/u previous visit after admission for urosepsis.Doing fairly well Power wheelchair was denied HTN- restarted HCTZ, leg swelling and BP has improved Irritation beneath both underarms, past couple of weeks, feels very sore, has not seen any boils Due for flu shot    Review of Systems  GEN- denies fatigue, fever, weight loss,weakness, recent illness HEENT- denies eye drainage, change in vision, nasal discharge, CVS- denies chest pain, palpitations RESP- denies SOB, cough, wheeze ABD- denies N/V, change in stools, abd pain GU- denies dysuria, hematuria, dribbling, incontinence MSK- + joint pain, muscle aches, injury Neuro- denies headache, dizziness, syncope, seizure activity      Objective:   Physical Exam GEN- NAD, alert and oriented x3, sitting in wheelchair, obese HEENT- EOMI, non injected sclera, pink conjunctiva, MMM, oropharynx clear, Neck- supple  no LAD CVS- RRR, no murmur RESP-CTAB EXT-+ edema R >L , bilateral hands/wrist non pitting edema R>L Pulses- Radial, 2+, DP diminished bilat Skin- bilateral axilla- erythema with cracking , mild odor noted, no abscess seen        Assessment & Plan:

## 2013-10-15 ENCOUNTER — Ambulatory Visit (HOSPITAL_COMMUNITY)
Admission: RE | Admit: 2013-10-15 | Discharge: 2013-10-15 | Disposition: A | Discharge status: Home / Self Care | Payer: Medicare Other | Source: Ambulatory Visit | Attending: Family Medicine | Admitting: Family Medicine

## 2013-10-15 DIAGNOSIS — E119 Type 2 diabetes mellitus without complications: Secondary | ICD-10-CM | POA: Insufficient documentation

## 2013-10-15 DIAGNOSIS — IMO0001 Reserved for inherently not codable concepts without codable children: Secondary | ICD-10-CM | POA: Insufficient documentation

## 2013-10-15 DIAGNOSIS — G8929 Other chronic pain: Secondary | ICD-10-CM

## 2013-10-15 DIAGNOSIS — M25519 Pain in unspecified shoulder: Secondary | ICD-10-CM | POA: Insufficient documentation

## 2013-10-15 DIAGNOSIS — I10 Essential (primary) hypertension: Secondary | ICD-10-CM | POA: Insufficient documentation

## 2013-10-15 DIAGNOSIS — M6281 Muscle weakness (generalized): Secondary | ICD-10-CM | POA: Insufficient documentation

## 2013-10-15 NOTE — Progress Notes (Signed)
Occupational Therapy Treatment Patient Details  Name: [name] MRN: [id number] Date of Birth: [birth year]  Today's Date: 10/15/2013 Time: 4765-4650 OT Time Calculation (min): 36 min Manual therapy  3546-5681 8' Therapeutic exercises 2751-7001 28' Visit#: 7 of 12  Re-eval: 10/23/13    Authorization: [employer] Medicare  Authorization Time Period: before 10th visit  Authorization Visit#: 7 of 10  Subjective S:  Its helping some, I guess Pain Assessment Currently in Pain?: Yes Pain Score: 5  Pain Location: Shoulder Pain Orientation: Left Pain Type: Chronic pain  Precautions/Restrictions   progress as tolerated  Exercise/Treatments Seated Elevation: AROM;15 reps Extension: AROM;15 reps Row: AROM;15 reps Protraction: PROM;AAROM;10 reps Horizontal ABduction: PROM;AAROM;10 reps External Rotation: PROM;AAROM;10 reps Internal Rotation: PROM;AAROM;10 reps Flexion: PROM;AAROM;10 reps Abduction: PROM;AAROM;10 reps Other Seated Exercises: moved yellow and red clothespins from 2nd horizontal row to highest horizontal row, and then to vertical row, and then worked backwards 14 clothespins used Other Seated Exercises: moved 10 cones from table to chair placed to patients left and then back, goal to improve independence reaching into abduction in order to retrieve items from TV table at home.     Manual Therapy Manual Therapy: Myofascial release Myofascial Release: MFR and massage to left shoulder region, upper arm, and scapular region this date to decrease pain and restrictions and improve pain free mobiilty. Completed in seated, per patient request  Occupational Therapy Assessment and Plan OT Assessment and Plan Clinical Impression Statement: A:  Patient able to complete AROM in seated, and functional reaching into flexion and abduction this date with fair form, fatigued after reaching activities and requested ending session for the day.  OT Plan: P:  Attempt wall wash activity and  increase sustained activity tolerance for reaching forward and to side with functional activities.    Goals Short Term Goals Short Term Goal 1: Patient will be educated on a HEP. Short Term Goal 2: Patient will increase PROM by 10 degrees in his left shoulder for greater independence donning and doffing shirts.  Short Term Goal 3: Patient will increase left shoulder strength to 4-/5 for greater ability to assist with transfers.  Short Term Goal 4: Patient will decrease pain in his left shoulder to 6/10 with functional activities.  Short Term Goal 5: Patient will decrease fascial restrictions to moderate in his left shoulder region.  Long Term Goals Long Term Goal 1: Patient will return to prior level of function with his daily activities.  Long Term Goal 2: Patient will increase AROM by 15 degrees in his left shoulder for greater independence donning and doffing shirts.  Long Term Goal 3: Patient will increase left shoulder strength to 4/5 for greater ability to assist with transfers.  Long Term Goal 4: Patient will decrease pain in his left shoulder to 3/10 with functional activities.  Long Term Goal 5: Patient will decrease fascial restrictions to moderate in his left shoulder region.   Problem List Patient Active Problem List   Diagnosis Date Noted  . Intertrigo 10/12/2013  . Chronic left shoulder pain 09/25/2013  . Muscle weakness (generalized) 09/25/2013  . Hypercalcemia 08/08/2013  . CKD (chronic kidney disease), stage III 08/07/2013  . Adhesive capsulitis of left shoulder 05/14/2013  . Steatohepatitis   . Diverticulosis   . GERD (gastroesophageal reflux disease)   . CVA (cerebral infarction)   . Hiatal hernia   . Hyperlipidemia   . Glaucoma (increased eye pressure)   . Esotropia of left eye   . BPH (benign prostatic hyperplasia)   .  Hx of radiation therapy   . Parotid gland adenocarcinoma 11/23/2012  . Epidermoid cyst 10/05/2012  . Neck pain 08/26/2012  . Chronic  abdominal pain 08/26/2012  . Leg edema 08/24/2012  . Hemiplegia affecting dominant side, late effect of cerebrovascular disease 06/06/2012  . Diarrhea 04/17/2012  . Obesity 04/17/2012  . Diabetic neuropathy 02/13/2012  . Recurrent boils 01/12/2012  . 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 Hemorrhoids, internal 03/23/2011  . Hepatitis 03/02/2011  . EOSINOPHILIA 12/23/2009  . DM 01/05/2009  . Gout, unspecified 01/05/2009  . HYPERTENSION 01/05/2009  . BARRETTS ESOPHAGUS 01/05/2009  . DIVERTICULOSIS OF COLON 01/05/2009  . LYMPHOMA, HX OF 01/05/2009    End of Session Activity Tolerance: Patient tolerated treatment well General Behavior During Therapy: [facility] for tasks assessed/performed  GO     10/15/2013, 11:50 AM

## 2013-10-17 ENCOUNTER — Ambulatory Visit (HOSPITAL_COMMUNITY): Payer: Medicare Other | Admitting: Occupational Therapy

## 2013-10-18 ENCOUNTER — Ambulatory Visit (HOSPITAL_COMMUNITY)
Admission: RE | Admit: 2013-10-18 | Discharge: 2013-10-18 | Disposition: A | Discharge status: Home / Self Care | Payer: Medicare Other | Source: Ambulatory Visit | Attending: Family Medicine | Admitting: Family Medicine

## 2013-10-18 DIAGNOSIS — G8929 Other chronic pain: Secondary | ICD-10-CM

## 2013-10-18 DIAGNOSIS — M6281 Muscle weakness (generalized): Secondary | ICD-10-CM

## 2013-10-18 NOTE — Progress Notes (Signed)
Occupational Therapy Treatment Patient Details  Name: [name] MRN: [id number] Date of Birth: 1946-07-17  Today's Date: 10/18/2013 Time: 5009-3818 OT Time Calculation (min): 33 min Manual Therapy 2993-7169 11' Therapeutic exercises 6789-3810 22' Visit#: 8 of 12  Re-eval: 10/23/13    Authorization: [employer] Medicare  Authorization Time Period: before 10th visit  Authorization Visit#: 8 of 10  Subjective S:  My arm has been really sore the last 2 days. Pain Assessment Currently in Pain?: Yes Pain Score: 4  Pain Location: Shoulder Pain Orientation: Left Pain Type: Chronic pain  Precautions/Restrictions   progress as tolerated  Exercise/Treatments Seated Elevation: AROM;15 reps Extension: Theraband;15 reps Theraband Level (Shoulder Extension): Level 2 (Red) Row: Theraband;15 reps Theraband Level (Shoulder Row): Level 2 (Red) Protraction: PROM;AAROM;10 reps Horizontal ABduction: PROM;AAROM;10 reps External Rotation: PROM;AAROM;10 reps Internal Rotation: PROM;AAROM;10 reps Flexion: PROM;AAROM;10 reps Abduction: PROM;AAROM;10 reps Other Seated Exercises: moved yellow and red clothespins from 2nd horizontal row to highest horizontal row, and then to vertical row, and then worked backwards 14 clothespins used   Stretches Table Stretch - Flexion:  (50 reps) Table Stretch - Abduction:  (circular 50 times)    Manual Therapy Manual Therapy: Myofascial release Myofascial Release: MFR and massage to left shoulder region, upper arm, and scapular region this date to decrease pain and restrictions and improve pain free mobiilty. Completed in seated, per patient request  Occupational Therapy Assessment and Plan OT Assessment and Plan Clinical Impression Statement: A:  Added tband for scapular stability extension and row in order to have greater control when reaching forward and to the side.   OT Plan: P:  Reassess for monthly progress, add ball on wall activity for scapular  stability.    Goals Short Term Goals Short Term Goal 1: Patient will be educated on a HEP. Short Term Goal 2: Patient will increase PROM by 10 degrees in his left shoulder for greater independence donning and doffing shirts.  Short Term Goal 3: Patient will increase left shoulder strength to 4-/5 for greater ability to assist with transfers.  Short Term Goal 4: Patient will decrease pain in his left shoulder to 6/10 with functional activities.  Short Term Goal 5: Patient will decrease fascial restrictions to moderate in his left shoulder region.  Long Term Goals Long Term Goal 1: Patient will return to prior level of function with his daily activities.  Long Term Goal 2: Patient will increase AROM by 15 degrees in his left shoulder for greater independence donning and doffing shirts.  Long Term Goal 3: Patient will increase left shoulder strength to 4/5 for greater ability to assist with transfers.  Long Term Goal 4: Patient will decrease pain in his left shoulder to 3/10 with functional activities.  Long Term Goal 5: Patient will decrease fascial restrictions to moderate in his left shoulder region.   Problem List Patient Active Problem List   Diagnosis Date Noted  . Intertrigo 10/12/2013  . Chronic left shoulder pain 09/25/2013  . Muscle weakness (generalized) 09/25/2013  . Hypercalcemia 08/08/2013  . CKD (chronic kidney disease), stage III 08/07/2013  . Adhesive capsulitis of left shoulder 05/14/2013  . Steatohepatitis   . Diverticulosis   . GERD (gastroesophageal reflux disease)   . CVA (cerebral infarction)   . Hiatal hernia   . Hyperlipidemia   . Glaucoma (increased eye pressure)   . Esotropia of left eye   . BPH (benign prostatic hyperplasia)   . Hx of radiation therapy   . Parotid gland adenocarcinoma 11/23/2012  .  Epidermoid cyst 10/05/2012  . Neck pain 08/26/2012  . Chronic abdominal pain 08/26/2012  . Leg edema 08/24/2012  . Hemiplegia affecting dominant side, late  effect of cerebrovascular disease 06/06/2012  . Diarrhea 04/17/2012  . Obesity 04/17/2012  . Diabetic neuropathy 02/13/2012  . Recurrent boils 01/12/2012  . 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 Hemorrhoids, internal 03/23/2011  . Hepatitis 03/02/2011  . EOSINOPHILIA 12/23/2009  . DM 01/05/2009  . Gout, unspecified 01/05/2009  . HYPERTENSION 01/05/2009  . BARRETTS ESOPHAGUS 01/05/2009  . DIVERTICULOSIS OF COLON 01/05/2009  . LYMPHOMA, HX OF 01/05/2009       [place], OTR/L  10/18/2013, 12:11 PM

## 2013-10-22 ENCOUNTER — Ambulatory Visit (INDEPENDENT_AMBULATORY_CARE_PROVIDER_SITE_OTHER): Payer: Medicare Other | Admitting: Family Medicine

## 2013-10-22 ENCOUNTER — Encounter: Payer: Self-pay | Admitting: Family Medicine

## 2013-10-22 VITALS — BP 130/70 | HR 80 | Temp 98.0°F | Resp 20 | Wt 271.0 lb

## 2013-10-22 DIAGNOSIS — I69959 Hemiplegia and hemiparesis following unspecified cerebrovascular disease affecting unspecified side: Secondary | ICD-10-CM

## 2013-10-22 DIAGNOSIS — L0103 Bullous impetigo: Secondary | ICD-10-CM

## 2013-10-22 DIAGNOSIS — L0100 Impetigo, unspecified: Secondary | ICD-10-CM

## 2013-10-22 MED ORDER — SULFAMETHOXAZOLE-TMP DS 800-160 MG PO TABS: 1.0000 | ORAL_TABLET | Freq: Two times a day (BID) | ORAL | Status: DC

## 2013-10-22 NOTE — Patient Instructions (Signed)
Take the antibiotics as prescribed I will call back to update you on the chair F/U as previous  Impetigo Impetigo is an infection of the skin, most common in babies and children.  CAUSES  It is caused by staphylococcal or streptococcal germs (bacteria). Impetigo can start after any damage to the skin. The damage to the skin may be from things like:   Chickenpox.  Scrapes.  Scratches.  Insect bites (common when children scratch the bite).  Cuts.  Nail biting or chewing. Impetigo is contagious. It can be spread from one person to another. Avoid close skin contact, or sharing towels or clothing. SYMPTOMS  Impetigo usually starts out as small blisters or pustules. Then they turn into tiny yellow-crusted sores (lesions).  There may also be:  Large blisters.  Itching or pain.  Pus.  Swollen lymph glands. With scratching, irritation, or non-treatment, these small areas may get larger. Scratching can cause the germs to get under the fingernails; then scratching another part of the skin can cause the infection to be spread there. DIAGNOSIS  Diagnosis of impetigo is usually made by a physical exam. A skin culture (test to grow bacteria) may be done to prove the diagnosis or to help decide the best treatment.  TREATMENT  Mild impetigo can be treated with prescription antibiotic cream. Oral antibiotic medicine may be used in more severe cases. Medicines for itching may be used. HOME CARE INSTRUCTIONS   To avoid spreading impetigo to other body areas:  Keep fingernails short and clean.  Avoid scratching.  Cover infected areas if necessary to keep from scratching.  Gently wash the infected areas with antibiotic soap and water.  Soak crusted areas in warm soapy water using antibiotic soap.  Gently rub the areas to remove crusts. Do not scrub.  Wash hands often to avoid spread this infection.  Keep children with impetigo home from school or daycare until they have used an  antibiotic cream for 48 hours (2 days) or oral antibiotic medicine for 24 hours (1 day), and their skin shows significant improvement.  Children may attend school or daycare if they only have a few sores and if the sores can be covered by a bandage or clothing. SEEK MEDICAL CARE IF:   More blisters or sores show up despite treatment.  Other family members get sores.  Rash is not improving after 48 hours (2 days) of treatment. SEEK IMMEDIATE MEDICAL CARE IF:   You see spreading redness or swelling of the skin around the sores.  You see red streaks coming from the sores.  Your child develops a fever of 100.4 F (37.2 C) or higher.  Your child develops a sore throat.  Your child is acting ill (lethargic, sick to their stomach). Document Released: 11/25/2000 Document Revised: 02/20/2012 Document Reviewed: 09/24/2008 [facility] Patient Information 2014 [place].

## 2013-10-23 ENCOUNTER — Encounter: Payer: Self-pay | Admitting: Family Medicine

## 2013-10-23 DIAGNOSIS — L0103 Bullous impetigo: Secondary | ICD-10-CM | POA: Insufficient documentation

## 2013-10-23 NOTE — Progress Notes (Addendum)
  Subjective:    Patient ID: [name], male    DOB: 12/01/1946, 67 y.o.   MRN: [id number]  HPI  Patient is here secondary to some blistering lesions on his left lower leg. Initially he had a scratch which then became red and the blisters started. He has mild discomfort around the blister lesions. He denies any fever. He did have one lesion on his left upper thigh which burst on its own.  He is in need of motorized wheelchair. I did speak with the representative today that states that they can appeal once again or we will have to restart his whole process for his wheelchair. Therefore I will document in this note today regarding his need for a motorized wheelchair do to Hemiplegia secondary to remote CVA.   No recent falls with transfers. Requires significant assistance to transfer from bed to chair. Unable to use walker, manual wheelchair or scooter due to hemiplegia. Pt lives on single floor in home, Motorized wheelchair will allow him better access to home, including restroom, dining area and bedroom. Motorized wheelchair will help his assist with ADL's, feeding and tolieting. At this time he is unable to leave the home  without great taxing effort from his wife and others  as he does not have the power wheelchair to assist his daily needs and independence.    Review of Systems - per above    GEN- denies fatigue, fever, weight loss,weakness, recent illness HEENT- denies eye drainage, change in vision, nasal discharge, CVS- denies chest pain, palpitations RESP- denies SOB, cough, wheeze ABD- denies N/V, change in stools, abd pain GU- denies dysuria, hematuria, dribbling, incontinence MSK- + joint pain, muscle aches, injury Neuro- denies headache, dizziness, syncope, seizure activity      Objective:   Physical Exam  GEN- NAD, alert and oriented x3, sitting in wheelchair, obese HEENT- EOMI, non injected sclera, pink conjunctiva, MMM, oropharynx clear, CVS- RRR, no  murmur RESP-CTAB EXT-+ edema R >L Pulses- Radial, 2+ Neuro- right hemiplegia. Unable to stand without significant assistance. Motor 0/5 RUE, 1/5 RLE. Decreased tone in Right upper and lower ext , unable to ambulate.   Good grasp Left hand , left UE, decreased ROM, strength 4/5, LLE 4/5 Skin-   Left leg 2 moderate size tense bulla with cloudy fluid,erythema surrounding, hyperpigmented scab to left of bulla, mild TTP around lesions       Assessment & Plan:     Pt is motivated to use power wheelchair Pt mentally able to operate power wheelchair  Due to hemiplegia can not use cane/walker, manual wheelchair or scooter   Motorized chair will provide some freedom for patient to assist with ADL's.  Patient requires a motorized wheelchair that will allow him to operate the chair with minimal effort from his left upper extremity only.

## 2013-10-23 NOTE — Assessment & Plan Note (Signed)
Per above, will need Power wheelchair

## 2013-10-23 NOTE — Assessment & Plan Note (Signed)
Lesions covered at the bedside. I will start him on Bactrim to cover for staphylococcal infection

## 2013-10-24 ENCOUNTER — Ambulatory Visit (HOSPITAL_COMMUNITY)
Admission: RE | Admit: 2013-10-24 | Discharge: 2013-10-24 | Disposition: A | Discharge status: Home / Self Care | Payer: Medicare Other | Source: Ambulatory Visit | Attending: Family Medicine | Admitting: Family Medicine

## 2013-10-24 DIAGNOSIS — M6281 Muscle weakness (generalized): Secondary | ICD-10-CM

## 2013-10-24 DIAGNOSIS — G8929 Other chronic pain: Secondary | ICD-10-CM

## 2013-10-24 NOTE — Evaluation (Signed)
Occupational Therapy Re-Evaluation (Discharge)  Patient Details  Name: [name] MRN: [id number] Date of Birth: 02/23/1946  Today's Date: 10/24/2013 Time: 8372-9021 OT Time Calculation (min): 30 min Reassessment 1436-1451 (15') Manual 1451-1506 (15')  Visit#: 9 of 12  Re-eval: 10/23/13  Assessment Diagnosis: Left Adhesive Capsulitis of shoulder  Next MD Visit: December 2014  Authorization: [facility] Medicare  Authorization Time Period: before 10th visit  Authorization Visit#: 9 of 10   Past Medical History:  Past Medical History  Diagnosis Date  . Barrett's esophagus     EGD 03/23/2011 & EGD 2/09 bx proven  . Steatohepatitis     liver biopsy 2009  . DM (diabetes mellitus)   . Diverticulosis     TCS 03/23/11 pancolonic diverticula &TCS 5/08, pancolonic diverticula  . GERD (gastroesophageal reflux disease)   . HTN (hypertension)   . CVA (cerebral infarction) 1998    right sided deficit  . Gout   . Lymphoma 1974    XRT at [facility], right base of skull area  . Hepatitis     esosiniphilic, tx with prednisone  . Renal insufficiency   . 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 Hiatal hernia   . Colon polyp 03/23/2011    tubular adenoma, Dr. Gala Romney  . Hemorrhoids, internal 03/23/2011    tcs by Dr. Gala Romney  . Hyperlipidemia   . Hemorrhagic colitis 06/06/2012.  . Glaucoma (increased eye pressure)   . Arthritis   . Asthma     "hx of"  . 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 Lower facial weakness     Right  . Esotropia of left eye   . Edema of lower extremity 12/21/12    bilateral   . BPH (benign prostatic hyperplasia)   . Hx of radiation therapy 1974    right base of skull area-lymphoma  . Stroke 1998    right hemiparesis/plegia  . Chronic abdominal pain    Past Surgical History:  Past Surgical History  Procedure Laterality Date  . Cholecystectomy    . Right lymph node removal    . Right video-assisted thoracic surgery, pleurectomy,  and pleurodesis  2011  . Esophagogastroduodenoscopy  02/05/08    goblet cell metaplasia/negative for H.pylori  . Colonoscopy  03/23/11    Dr. Gala Romney  pancolonic diverticula, hemorrhoids, tubular adenoma.. next tcs 03/2016  . Esophagogastroduodenoscopy  03/23/11    Dr. Gala Romney, barretts, hiatal hernia  . Mass biopsy  11/01/2012    Procedure: NECK MASS BIOPSY;  Surgeon: Ascencion Dike, MD;  Location: [facility];  Service: ENT;  Laterality: Right;  Excisional Bx Right Neck Mass; attempted external jugular cutdown of left side  . Parotidectomy  11/24/2012    Procedure: PAROTIDECTOMY;  Surgeon: Ascencion Dike, MD;  Location: [facility];  Service: ENT;  Laterality: N/A;  Total parotidectomy  . Pleurectomy      Subjective Symptoms/Limitations Symptoms: S: this arm is just hurting today  Pain Assessment Currently in Pain?: Yes Pain Score: 7  Pain Location: Shoulder Pain Orientation: Left Pain Type: Chronic pain Multiple Pain Sites: No  Precautions/Restrictions  Precautions Precautions: Fall  Prior Functioning  Home Living Additional Comments: lives with his wife.  Prior Function Level of Independence: Needs assistance with tranfers;Needs assistance with gait;Needs assistance with ADLs;Needs assistance with homemaking  Assessment ADL/Vision/Perception ADL ADL Comments: Patient reports no significant change in ability to perform/assist with dressing/ADL tasks  Dominant Hand: Left  Cognition/Observation Observation/Other Assessments Observations:  significant crepitis noted with left shoulder A/PROM  Additional Assessments LUE AROM (degrees) LUE Overall AROM Comments: assessed in seated ER/IR with shoulder adducted Left Shoulder Flexion: 86 Degrees Left Shoulder ABduction: 104 Degrees Left Shoulder Internal Rotation: 65 Degrees Left Shoulder External Rotation: 50 Degrees LUE PROM (degrees) Left Shoulder Flexion: 112 Degrees Left Shoulder ABduction: 105 Degrees Left Shoulder Internal Rotation: 65  Degrees Left Shoulder External Rotation: 50 Degrees LUE Strength Left Shoulder Flexion: 3+/5 Left Shoulder ABduction: 3+/5 Left Shoulder Internal Rotation: 3+/5 Left Shoulder External Rotation: 3+/5 Palpation Palpation: mod  fascial restrictions in shoulder and scapular region.       Exercise/Treatment    Manual Therapy Manual Therapy: Myofascial release Myofascial Release: MFR and massage to left shoulder region, upper arm, and scapular region this date to decrease pain and restrictions and improve pain free mobiilty. Completed in seated, per patient request  Occupational Therapy Assessment and Plan OT Assessment and Plan Clinical Impression Statement: Reassessment completed this date. Patient continues with significant pain in left shoulder with continued limit movement/mobility. He reports no significant changes in ability to utilize LUE or increased independence with ADL tasks. Wife reports she has not noticed any significant gains in use of LUE. He met 3/5 short term goals and no LTGs and demos > 15 degrees improvement in ROM throughout LUE.  Recommend patient continue with HEPs given and encouraged use of heat on low settings for 30mn a time to assist with tightness/pain at home and follow up with MD.  Discharge from skilled OT services at this time.  OT Plan: P:  Discharge at this time secondary to lack of progress and limited follow through on HEP    Goals Short Term Goals Short Term Goal 1: Patient will be educated on a HEP. Short Term Goal 1 Progress: Met Short Term Goal 2: Patient will increase PROM by 10 degrees in his left shoulder for greater independence donning and doffing shirts.  Short Term Goal 2 Progress: Met Short Term Goal 3: Patient will increase left shoulder strength to 4-/5 for greater ability to assist with transfers.  Short Term Goal 3 Progress: Not met Short Term Goal 4: Patient will decrease pain in his left shoulder to 6/10 with functional activities.   Short Term Goal 4 Progress: Not met Short Term Goal 5: Patient will decrease fascial restrictions to moderate in his left shoulder region.  Short Term Goal 5 Progress: Met Long Term Goals Long Term Goal 1: Patient will return to prior level of function with his daily activities.  Long Term Goal 1 Progress: Not met Long Term Goal 2: Patient will increase AROM by 15 degrees in his left shoulder for greater independence donning and doffing shirts.  Long Term Goal 2 Progress: Not met Long Term Goal 3: Patient will increase left shoulder strength to 4/5 for greater ability to assist with transfers.  Long Term Goal 3 Progress: Not met Long Term Goal 4: Patient will decrease pain in his left shoulder to 3/10 with functional activities.  Long Term Goal 4 Progress: Not met Long Term Goal 5: Patient will decrease fascial restrictions to moderate in his left shoulder region.  Long Term Goal 5 Progress: Not met  Problem List Patient Active Problem List   Diagnosis Date Noted  . Impetigo bullosa 10/23/2013  . Intertrigo 10/12/2013  . Chronic left shoulder pain 09/25/2013  . Muscle weakness (generalized) 09/25/2013  . Hypercalcemia 08/08/2013  . CKD (chronic kidney disease), stage III 08/07/2013  . Adhesive capsulitis  of left shoulder 05/14/2013  . Steatohepatitis   . Diverticulosis   . GERD (gastroesophageal reflux disease)   . CVA (cerebral infarction)   . Hiatal hernia   . Hyperlipidemia   . Glaucoma (increased eye pressure)   . Esotropia of left eye   . BPH (benign prostatic hyperplasia)   . Hx of radiation therapy   . Parotid gland adenocarcinoma 11/23/2012  . Epidermoid cyst 10/05/2012  . Neck pain 08/26/2012  . Chronic abdominal pain 08/26/2012  . Leg edema 08/24/2012  . Hemiplegia affecting dominant side, late effect of cerebrovascular disease 06/06/2012  . Diarrhea 04/17/2012  . Obesity 04/17/2012  . Diabetic neuropathy 02/13/2012  . Recurrent boils 01/12/2012  . 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 Hemorrhoids, internal 03/23/2011  . Hepatitis 03/02/2011  . EOSINOPHILIA 12/23/2009  . DM 01/05/2009  . Gout, unspecified 01/05/2009  . HYPERTENSION 01/05/2009  . BARRETTS ESOPHAGUS 01/05/2009  . DIVERTICULOSIS OF COLON 01/05/2009  . LYMPHOMA, HX OF 01/05/2009    End of Session Activity Tolerance: Patient tolerated treatment well General Behavior During Therapy: [facility] for tasks assessed/performed  GO Functional Assessment Tool Used: FOTO 33/100 Functional Limitation: Self care Self Care Goal Status ([id number]): At least 60 percent but less than 80 percent impaired, limited or restricted Self Care Discharge Status [phone]): At least 60 percent but less than 80 percent impaired, limited or restricted  Donney Rankins, OTR/L  10/24/2013, 4:38 PM  Physician Documentation Your signature is required to indicate approval of the treatment plan as stated above.  Please sign and either send electronically or make a copy of this report for your files and return this physician signed original.  Please mark one 1.__approve of plan  2. ___approve of plan with the following conditions.   ______________________________                                                          _____________________ Physician Signature                                                                                                             Date

## 2013-10-29 ENCOUNTER — Telehealth: Payer: Self-pay | Admitting: Family Medicine

## 2013-10-29 ENCOUNTER — Ambulatory Visit (HOSPITAL_COMMUNITY): Payer: Medicare Other | Admitting: Specialist

## 2013-10-29 NOTE — Telephone Encounter (Signed)
Carli from [facility] is call in regards to Mr. Tolen power Chair .

## 2013-10-29 NOTE — Telephone Encounter (Signed)
Called and LVM

## 2013-10-30 NOTE — Telephone Encounter (Signed)
I spoke with Ms. [name] We will set up a peer peer review between myself and the medical doctor and ATP Erlene Quan at [facility], He meets requirements on our end for a level 3 chair   Insurance Peer review set up # is [phone] Reference # [id number]   3 way conference call [phone]   Code [phone]    -

## 2013-10-30 NOTE — Telephone Encounter (Signed)
Sabrina please call  Insurance # given and set up a peer review meeting for either Friday at 1:30pm or next Tuesday at 1:30pm.  Let them know that It will be a conference call and I will be on the line as well as the ATP from the wheelchair provider that did the [place]  Given the the conference call number and the Code, the Medial Director will need to call this number and put the code in at the appointment time for the conference call.

## 2013-10-31 ENCOUNTER — Ambulatory Visit (HOSPITAL_COMMUNITY): Payer: Medicare Other | Admitting: Occupational Therapy

## 2013-11-01 ENCOUNTER — Other Ambulatory Visit: Payer: Self-pay | Admitting: Family Medicine

## 2013-11-04 ENCOUNTER — Ambulatory Visit (HOSPITAL_COMMUNITY): Payer: Medicare Other | Admitting: Specialist

## 2013-11-06 ENCOUNTER — Ambulatory Visit (HOSPITAL_COMMUNITY): Payer: Medicare Other | Admitting: Occupational Therapy

## 2013-11-19 ENCOUNTER — Telehealth: Payer: Self-pay | Admitting: Family Medicine

## 2013-11-19 NOTE — Telephone Encounter (Signed)
Pt wife is calling today because she is wanting to check on status of the chair and she states her husband needs a refill on his oxycodone  Call back number is [phone]

## 2013-11-20 MED ORDER — OXYCODONE-ACETAMINOPHEN 5-325 MG PO TABS: ORAL_TABLET | ORAL | Status: DC

## 2013-11-20 NOTE — Telephone Encounter (Signed)
Ok to refill 

## 2013-11-20 NOTE — Telephone Encounter (Signed)
Left message on pt voicemail to return my call, script printed and ready for dr. Approval and pt to pick up

## 2013-11-20 NOTE — Telephone Encounter (Signed)
Please let wife know, I did personal interview with his insurance company 2 weeks ago and they denied his chair again, it is the type of chair that was ordered by the therapist. They will pay for a chair that is the same as is current one, but we have to start the process over.  We will let her know if he has to have a separate exam, I am trying to use his appt from November to qualify Send my apologies but there is nothing else I can do to override things.  Okay to refill medications

## 2013-11-21 MED ORDER — FEBUXOSTAT 40 MG PO TABS: ORAL_TABLET | ORAL | Status: DC

## 2013-11-21 NOTE — Telephone Encounter (Signed)
Pt is aware of message and script is ready for pick up

## 2013-11-22 ENCOUNTER — Encounter: Payer: Self-pay | Admitting: Family Medicine

## 2013-12-10 ENCOUNTER — Encounter: Payer: Self-pay | Admitting: Orthopedic Surgery

## 2013-12-10 ENCOUNTER — Ambulatory Visit (INDEPENDENT_AMBULATORY_CARE_PROVIDER_SITE_OTHER): Payer: Medicare Other | Admitting: Orthopedic Surgery

## 2013-12-10 VITALS — BP 125/71 | Ht 72.0 in | Wt 271.0 lb

## 2013-12-10 DIAGNOSIS — M19019 Primary osteoarthritis, unspecified shoulder: Secondary | ICD-10-CM | POA: Insufficient documentation

## 2013-12-10 MED ORDER — OXYCODONE-ACETAMINOPHEN 7.5-325 MG PO TABS: 1.0000 | ORAL_TABLET | ORAL | Status: DC | PRN

## 2013-12-10 NOTE — Patient Instructions (Signed)
Start new pain reliever

## 2013-12-10 NOTE — Progress Notes (Signed)
Patient ID: [name], male   DOB: 10-30-1946, 67 y.o.   MRN: [id number]  Chief Complaint  Patient presents with  . Follow-up    recheck left shoulder adhesive capsulitis s/p therapy   Encounter Diagnosis  Name Primary?  [name] Arthritis, shoulder region Yes   BP 125/71  Ht 6' (1.829 m)  Wt 271 lb (122.925 kg)  BMI 36.75 kg/m2 Followup visit status post physical therapy for adhesive capsulitis and osteoarthritis of the left shoulder the patient did not improve  Review of systems left shoulder stiffness no numbness or tingling weakness loss of motion  He has 45 of external rotation 80 of abduction painful shoulder is stable internal/external rotation strength is normal skin is intact no swelling anterior joint line tenderness distal pulses intact normal sensation left arm  Discussion shoulder replacement versus increase in pain medication  The patient is probably a poor surgical candidate and we decided to go with an increase in his Percocet from 5-7.5 mg No orders of the defined types were placed in this encounter.

## 2014-01-07 ENCOUNTER — Encounter: Payer: Self-pay | Admitting: Family Medicine

## 2014-01-07 ENCOUNTER — Ambulatory Visit (INDEPENDENT_AMBULATORY_CARE_PROVIDER_SITE_OTHER): Payer: Medicare HMO | Admitting: Family Medicine

## 2014-01-07 VITALS — BP 120/90 | HR 80 | Temp 98.0°F | Resp 18

## 2014-01-07 DIAGNOSIS — R109 Unspecified abdominal pain: Secondary | ICD-10-CM

## 2014-01-07 DIAGNOSIS — K2270 Barrett's esophagus without dysplasia: Secondary | ICD-10-CM

## 2014-01-07 DIAGNOSIS — E119 Type 2 diabetes mellitus without complications: Secondary | ICD-10-CM

## 2014-01-07 DIAGNOSIS — I69959 Hemiplegia and hemiparesis following unspecified cerebrovascular disease affecting unspecified side: Secondary | ICD-10-CM

## 2014-01-07 DIAGNOSIS — G8929 Other chronic pain: Secondary | ICD-10-CM

## 2014-01-07 DIAGNOSIS — Z23 Encounter for immunization: Secondary | ICD-10-CM

## 2014-01-07 MED ORDER — AMLODIPINE BESYLATE 10 MG PO TABS: ORAL_TABLET | ORAL | Status: DC

## 2014-01-07 MED ORDER — PANTOPRAZOLE SODIUM 40 MG PO TBEC: 40.0000 mg | DELAYED_RELEASE_TABLET | Freq: Every day | ORAL | Status: DC

## 2014-01-07 NOTE — Addendum Note (Signed)
Addended by: Vic Blackbird F on: 01/07/2014 09:45 PM   Modules accepted: Orders

## 2014-01-07 NOTE — Patient Instructions (Signed)
Protonix new stomach medication Referral to Podiatry, Dr. Dorris Fetch, rehab- [address] Continue all other medications Prevnar 13 given F/U 3 months

## 2014-01-07 NOTE — Assessment & Plan Note (Signed)
Followed by endocrinology he needs updated referral to endocrine as well as podiatry

## 2014-01-07 NOTE — Progress Notes (Signed)
   Subjective:    Patient ID: [name], male    DOB: 11/20/1946, 68 y.o.   MRN: [id number]  HPI Patient here to followup in need of referrals. He recently switched his insurance. He still being followed by endocrinology for his diabetes mellitus and is due to have labs next month he needs a new referral to endocrine as well as podiatry for nail trimming and foot care. His paperwork was recently admitted again for a motorized wheelchair he needs a referral to have physical therapy evaluation this Friday at the neuro- rehabilitation in [place] He continues to have chronic abdominal pain states that he just continued to get acid reflux. His pain medication does help his abdominal pain. He's had significant workup by gastroenterology in the past with no specific cause of his pain he recommended chronic pain management   Review of Systems  GEN- denies fatigue, fever, weight loss,weakness, recent illness HEENT- denies eye drainage, change in vision, nasal discharge, CVS- denies chest pain, palpitations RESP- denies SOB, cough, wheeze ABD- denies N/V, change in stools,+ abd pain GU- denies dysuria, hematuria, dribbling, incontinence MSK- + joint pain, muscle aches, injury Neuro- denies headache, dizziness, syncope, seizure activity      Objective:   Physical Exam  GEN- NAD, alert and oriented x3, sitting in wheelchair/wheelchair bound, obese HEENT- EOMI, non injected sclera, pink conjunctiva, MMM, oropharynx clear, CVS- RRR, no murmur RESP-CTAB ABD-NABS,soft,NT, TTP RUQ, no rebound,  EXT-+ edema R , trace LLE edema Pulses- Radial, 2+ Neuro- right hemiplegia. Unable to stand without significant assistance.       Assessment & Plan:

## 2014-01-07 NOTE — Assessment & Plan Note (Addendum)
His continued chronic abdominal pain I do not see any benefit from return to gastroenterology he's had a lot of workup in the past which has not resulted in any recent with the exception of chronic pain secondary to stroke

## 2014-01-07 NOTE — Assessment & Plan Note (Signed)
He has had some worsening acid reflux therefore will try him on Protonix and discontinue the Prilosec

## 2014-01-07 NOTE — Assessment & Plan Note (Signed)
Physical therapy referral will be placed so that he can have evaluation for his motorized wheelchair

## 2014-01-10 ENCOUNTER — Ambulatory Visit: Payer: Medicare Other | Admitting: Physical Therapy

## 2014-01-20 ENCOUNTER — Ambulatory Visit: Payer: Medicare HMO | Admitting: Podiatry

## 2014-01-22 ENCOUNTER — Telehealth: Payer: Self-pay | Admitting: *Deleted

## 2014-01-22 NOTE — Telephone Encounter (Signed)
Contacted [employer] and pt has appt for evalution with PT [facility] tomorrow Feb 12, [employer] will set him up with wheelchair was PT does eval.

## 2014-01-22 NOTE — Telephone Encounter (Signed)
noted 

## 2014-01-23 ENCOUNTER — Ambulatory Visit: Payer: Medicare HMO | Attending: Family Medicine | Admitting: Physical Therapy

## 2014-01-23 DIAGNOSIS — IMO0001 Reserved for inherently not codable concepts without codable children: Secondary | ICD-10-CM | POA: Insufficient documentation

## 2014-01-23 DIAGNOSIS — R262 Difficulty in walking, not elsewhere classified: Secondary | ICD-10-CM | POA: Insufficient documentation

## 2014-01-23 DIAGNOSIS — I69959 Hemiplegia and hemiparesis following unspecified cerebrovascular disease affecting unspecified side: Secondary | ICD-10-CM | POA: Insufficient documentation

## 2014-01-29 ENCOUNTER — Telehealth: Payer: Self-pay | Admitting: *Deleted

## 2014-01-29 MED ORDER — TAMSULOSIN HCL 0.4 MG PO CAPS: 0.4000 mg | ORAL_CAPSULE | Freq: Every day | ORAL | Status: DC

## 2014-01-29 NOTE — Telephone Encounter (Signed)
Received VM from pt spouse. Call returned and spouse reported that patient requires refills on Tamsulosin and Omeprazole with 90 day supply from [facility].

## 2014-01-29 NOTE — Telephone Encounter (Signed)
Med(s) refilled, refilled only the flomax protonix was refilled on 01/07/14 with 2 refills

## 2014-01-30 ENCOUNTER — Telehealth: Payer: Self-pay | Admitting: Family Medicine

## 2014-01-30 MED ORDER — ALLOPURINOL 300 MG PO TABS: ORAL_TABLET | ORAL | Status: DC

## 2014-01-30 MED ORDER — METOPROLOL SUCCINATE ER 50 MG PO TB24: ORAL_TABLET | ORAL | Status: DC

## 2014-01-30 MED ORDER — TIZANIDINE HCL 2 MG PO TABS: ORAL_TABLET | ORAL | Status: DC

## 2014-01-30 NOTE — Telephone Encounter (Signed)
Call back number is [phone] Pt is needing refill on tiZANidine (ZANAFLEX) 2 MG tablet metoprolol succinate (TOPROL-XL) 50 MG 24 hr tablet allopurinol (ZYLOPRIM) 300 MG tablet Pharmacy is [facility]

## 2014-01-30 NOTE — Telephone Encounter (Signed)
Meds refilled.

## 2014-02-03 ENCOUNTER — Encounter: Payer: Self-pay | Admitting: Podiatry

## 2014-02-03 ENCOUNTER — Ambulatory Visit (INDEPENDENT_AMBULATORY_CARE_PROVIDER_SITE_OTHER): Payer: Commercial Managed Care - HMO | Admitting: Podiatry

## 2014-02-03 ENCOUNTER — Ambulatory Visit: Payer: Medicare HMO | Admitting: Podiatry

## 2014-02-03 VITALS — BP 109/66 | HR 70 | Resp 12

## 2014-02-03 DIAGNOSIS — B351 Tinea unguium: Secondary | ICD-10-CM

## 2014-02-03 DIAGNOSIS — E1049 Type 1 diabetes mellitus with other diabetic neurological complication: Secondary | ICD-10-CM

## 2014-02-03 DIAGNOSIS — M79609 Pain in unspecified limb: Secondary | ICD-10-CM

## 2014-02-03 NOTE — Progress Notes (Signed)
   Subjective:    Patient ID: [name], male    DOB: Jul 04, 1946, 68 y.o.   MRN: [id number]  HPI '' REFERRED BY DR. Buelah Manis CHECK DIABETIC TYPE 2 FEET AND TOENAILS TRIM ON BOTH FEET.''    Review of Systems  Cardiovascular: Positive for leg swelling.  Gastrointestinal: Positive for abdominal pain.  Neurological: Positive for numbness.  All other systems reviewed and are negative.       Objective:   Physical Exam  Orientated x3 lack male presents with his wife. He transfers from wheelchair to the treatment chair.  Vascular: The DP and PT pulses are 2/4 bilaterally  Neurological:  Sensation to 10 g monofilament wire intact 0/10 right and 4/10 left. Manual motor testing right lower extremity dorsi flexion, plantar flexion, inversion, eversion 0/5. The left lower extremity is 5/5.  Dermatological: Elongated, discolored, hypertrophic toenails x10  Musculoskeletal: The right lower extremity is in an abducted position and is flaccid.       Assessment & Plan:  Assessment: sensory and motor neuropathy  Onychomycoses with symptoms  Plan: Nails x10 are debrided back without a bleeding. Reappoint at three-month intervals. Gave patient a brochure on diabetic footcare.

## 2014-02-03 NOTE — Patient Instructions (Signed)
Diabetes and Foot Care Diabetes may cause you to have problems because of poor blood supply (circulation) to your feet and legs. This may cause the skin on your feet to become thinner, break easier, and heal more slowly. Your skin may become dry, and the skin may peel and crack. You may also have nerve damage in your legs and feet causing decreased feeling in them. You may not notice minor injuries to your feet that could lead to infections or more serious problems. Taking care of your feet is one of the most important things you can do for yourself.  HOME CARE INSTRUCTIONS  Wear shoes at all times, even in the house. Do not go barefoot. Bare feet are easily injured.  Check your feet daily for blisters, cuts, and redness. If you cannot see the bottom of your feet, use a mirror or ask someone for help.  Wash your feet with warm water (do not use hot water) and mild soap. Then pat your feet and the areas between your toes until they are completely dry. Do not soak your feet as this can dry your skin.  Apply a moisturizing lotion or petroleum jelly (that does not contain alcohol and is unscented) to the skin on your feet and to dry, brittle toenails. Do not apply lotion between your toes.  Trim your toenails straight across. Do not dig under them or around the cuticle. File the edges of your nails with an emery board or nail file.  Do not cut corns or calluses or try to remove them with medicine.  Wear clean socks or stockings every day. Make sure they are not too tight. Do not wear knee-high stockings since they may decrease blood flow to your legs.  Wear shoes that fit properly and have enough cushioning. To break in new shoes, wear them for just a few hours a day. This prevents you from injuring your feet. Always look in your shoes before you put them on to be sure there are no objects inside.  Do not cross your legs. This may decrease the blood flow to your feet.  If you find a minor scrape,  cut, or break in the skin on your feet, keep it and the skin around it clean and dry. These areas may be cleansed with mild soap and water. Do not cleanse the area with peroxide, alcohol, or iodine.  When you remove an adhesive bandage, be sure not to damage the skin around it.  If you have a wound, look at it several times a day to make sure it is healing.  Do not use heating pads or hot water bottles. They may burn your skin. If you have lost feeling in your feet or legs, you may not know it is happening until it is too late.  Make sure your health care provider performs a complete foot exam at least annually or more often if you have foot problems. Report any cuts, sores, or bruises to your health care provider immediately. SEEK MEDICAL CARE IF:   You have an injury that is not healing.  You have cuts or breaks in the skin.  You have an ingrown nail.  You notice redness on your legs or feet.  You feel burning or tingling in your legs or feet.  You have pain or cramps in your legs and feet.  Your legs or feet are numb.  Your feet always feel cold. SEEK IMMEDIATE MEDICAL CARE IF:   There is increasing redness,   swelling, or pain in or around a wound.  There is a red line that goes up your leg.  Pus is coming from a wound.  You develop a fever or as directed by your health care provider.  You notice a bad smell coming from an ulcer or wound. Document Released: 11/25/2000 Document Revised: 07/31/2013 Document Reviewed: 05/07/2013 ExitCare Patient Information 2014 [employer].  

## 2014-02-04 ENCOUNTER — Encounter: Payer: Self-pay | Admitting: Podiatry

## 2014-02-10 ENCOUNTER — Ambulatory Visit (INDEPENDENT_AMBULATORY_CARE_PROVIDER_SITE_OTHER): Payer: Medicare HMO | Admitting: Family Medicine

## 2014-02-10 ENCOUNTER — Encounter: Payer: Self-pay | Admitting: Family Medicine

## 2014-02-10 VITALS — BP 130/80 | HR 66 | Temp 98.2°F | Resp 18 | Ht 72.0 in | Wt 270.0 lb

## 2014-02-10 DIAGNOSIS — L0100 Impetigo, unspecified: Secondary | ICD-10-CM

## 2014-02-10 DIAGNOSIS — Z8673 Personal history of transient ischemic attack (TIA), and cerebral infarction without residual deficits: Secondary | ICD-10-CM

## 2014-02-10 DIAGNOSIS — I10 Essential (primary) hypertension: Secondary | ICD-10-CM

## 2014-02-10 DIAGNOSIS — L0103 Bullous impetigo: Secondary | ICD-10-CM

## 2014-02-10 DIAGNOSIS — E785 Hyperlipidemia, unspecified: Secondary | ICD-10-CM

## 2014-02-10 MED ORDER — METOPROLOL SUCCINATE ER 50 MG PO TB24: ORAL_TABLET | ORAL | Status: DC

## 2014-02-10 MED ORDER — SIMVASTATIN 20 MG PO TABS: ORAL_TABLET | ORAL | Status: DC

## 2014-02-10 MED ORDER — SULFAMETHOXAZOLE-TMP DS 800-160 MG PO TABS: 1.0000 | ORAL_TABLET | Freq: Two times a day (BID) | ORAL | Status: DC

## 2014-02-10 MED ORDER — CLONIDINE HCL 0.2 MG PO TABS: 0.2000 mg | ORAL_TABLET | Freq: Three times a day (TID) | ORAL | Status: DC

## 2014-02-10 MED ORDER — HYDROCHLOROTHIAZIDE 25 MG PO TABS: ORAL_TABLET | ORAL | Status: DC

## 2014-02-10 MED ORDER — LISINOPRIL 20 MG PO TABS: ORAL_TABLET | ORAL | Status: DC

## 2014-02-10 NOTE — Patient Instructions (Signed)
Take antibiotics as prescribed Continue all other medications I will obtain labs from Dr. Dorris Fetch F/U 4 months

## 2014-02-11 ENCOUNTER — Encounter: Payer: Self-pay | Admitting: Family Medicine

## 2014-02-11 MED ORDER — SIMVASTATIN 20 MG PO TABS: ORAL_TABLET | ORAL | Status: DC

## 2014-02-11 MED ORDER — CLONIDINE HCL 0.2 MG PO TABS: 0.2000 mg | ORAL_TABLET | Freq: Three times a day (TID) | ORAL | Status: DC

## 2014-02-11 MED ORDER — LISINOPRIL 20 MG PO TABS: ORAL_TABLET | ORAL | Status: DC

## 2014-02-11 MED ORDER — AMLODIPINE BESYLATE 10 MG PO TABS: ORAL_TABLET | ORAL | Status: DC

## 2014-02-11 MED ORDER — HYDROCHLOROTHIAZIDE 25 MG PO TABS: ORAL_TABLET | ORAL | Status: DC

## 2014-02-11 MED ORDER — METOPROLOL SUCCINATE ER 50 MG PO TB24: ORAL_TABLET | ORAL | Status: DC

## 2014-02-11 NOTE — Assessment & Plan Note (Signed)
He has recurrence of another bullous lesion on his leg. Based on the appearance of the drainage I will cover him with Bactrim as he is known to have multiple bacterial pustule lesions that we have had to have incised and drained

## 2014-02-11 NOTE — Progress Notes (Signed)
Patient ID: [name], male   DOB: 27-Mar-1946, 68 y.o.   MRN: [id number]   Subjective:    Patient ID: [name], male    DOB: 10/29/1946, 68 y.o.   MRN: [id number]  Patient presents for 4 month F/U and L thigh open area  patient here for routine followup. His wife has noted that he had another blister came out that became infected on his left leg below his knee. It has been draining a yellow pus with a mild odor. He does not have any other lesions. He has blood work to be drawn tomorrow for endocrinology and has followup next week for his diabetes mellitus. His blood sugars have been doing fairly well. He has not had any hypoglycemia  He recently had a physical therapy evaluation for his wheelchair however have not received any paperwork regarding the order for the chair.     Review Of Systems:  GEN- denies fatigue, fever, weight loss,weakness, recent illness HEENT- denies eye drainage, change in vision, nasal discharge, CVS- denies chest pain, palpitations RESP- denies SOB, cough, wheeze ABD- denies N/V, change in stools, abd pain GU- denies dysuria, hematuria, dribbling, incontinence MSK- + joint pain, muscle aches, injury Neuro- denies headache, dizziness, syncope, seizure activity       Objective:    BP 130/80  Pulse 66  Temp(Src) 98.2 F (36.8 C)  Resp 18  Ht 6' (1.829 m)  Wt 270 lb (122.471 kg)  BMI 36.61 kg/m2 GEN- NAD, alert and oriented x3, sitting in wheelchair/wheelchair bound, obese HEENT- EOMI, non injected sclera, pink conjunctiva, MMM, oropharynx clear, CVS- RRR, no murmur RESP-CTAB EXT-Trace  Edema R >L Skin- Left leg - below knee draining blister with yellow pus, mild erythema, dry skin, hyperkeratic pigmented lesions on knees Pulses- Radial, 2+      Assessment & Plan:      Problem List Items Addressed This Visit   None      Note: This dictation was prepared with Dragon dictation along with smaller phrase technology. Any transcriptional  errors that result from this process are unintentional.

## 2014-02-11 NOTE — Assessment & Plan Note (Signed)
Blood pressure is well-controlled with current med

## 2014-02-11 NOTE — Assessment & Plan Note (Signed)
His fasting labs will be done tomorrow with his endocrinology I will have his lipid panel drawn

## 2014-02-11 NOTE — Addendum Note (Signed)
Addended by: Vic Blackbird F on: 02/11/2014 02:07 PM   Modules accepted: Orders

## 2014-02-11 NOTE — Assessment & Plan Note (Signed)
Residual right sided deficits. He is still waiting his motorized wheelchair which is needed to help him perform his activities of daily living, wife is care giver

## 2014-02-19 ENCOUNTER — Other Ambulatory Visit: Payer: Self-pay | Admitting: *Deleted

## 2014-02-19 ENCOUNTER — Telehealth: Payer: Self-pay | Admitting: *Deleted

## 2014-02-19 MED ORDER — SIMVASTATIN 20 MG PO TABS: ORAL_TABLET | ORAL | Status: DC

## 2014-02-19 MED ORDER — CLONIDINE HCL 0.2 MG PO TABS: 0.2000 mg | ORAL_TABLET | Freq: Three times a day (TID) | ORAL | Status: DC

## 2014-02-19 MED ORDER — FEBUXOSTAT 40 MG PO TABS: ORAL_TABLET | ORAL | Status: DC

## 2014-02-19 NOTE — Telephone Encounter (Signed)
Refill appropriate and filled per protocol. 

## 2014-02-19 NOTE — Telephone Encounter (Signed)
Message copied by Sheral Flow on Wed Feb 19, 2014 12:45 PM ------      Message from: [facility], Judson Roch J      Created: Wed Feb 19, 2014 12:16 PM      Contact: [phone]       Needs new 90 day supply Rx's sent to [facility] for:      Clonidine 0.2      Uloric 40 mgs      Simvastatin 20 mgs            This is first time using mail order pharmacy due to new insurance ------

## 2014-02-28 ENCOUNTER — Other Ambulatory Visit: Payer: Self-pay | Admitting: *Deleted

## 2014-02-28 MED ORDER — HYDROCHLOROTHIAZIDE 25 MG PO TABS: ORAL_TABLET | ORAL | Status: DC

## 2014-02-28 MED ORDER — LINAGLIPTIN 5 MG PO TABS: ORAL_TABLET | ORAL | Status: DC

## 2014-02-28 MED ORDER — LISINOPRIL 20 MG PO TABS: ORAL_TABLET | ORAL | Status: DC

## 2014-02-28 NOTE — Telephone Encounter (Signed)
Refill appropriate and filled per protocol. 

## 2014-03-04 ENCOUNTER — Telehealth: Payer: Self-pay | Admitting: *Deleted

## 2014-03-04 NOTE — Telephone Encounter (Signed)
Received e-mail requesting PA for linagliptin.  PA submitted.   PA approved.

## 2014-03-10 IMAGING — CR DG CHEST 1V PORT
1 series · 1 of 1 positions shown · non-contrast
Comparison: 07/05/2011.  05/05/2010.

CLINICAL DATA: Shortness of breath

PORTABLE CHEST - 1 VIEW

[portable]
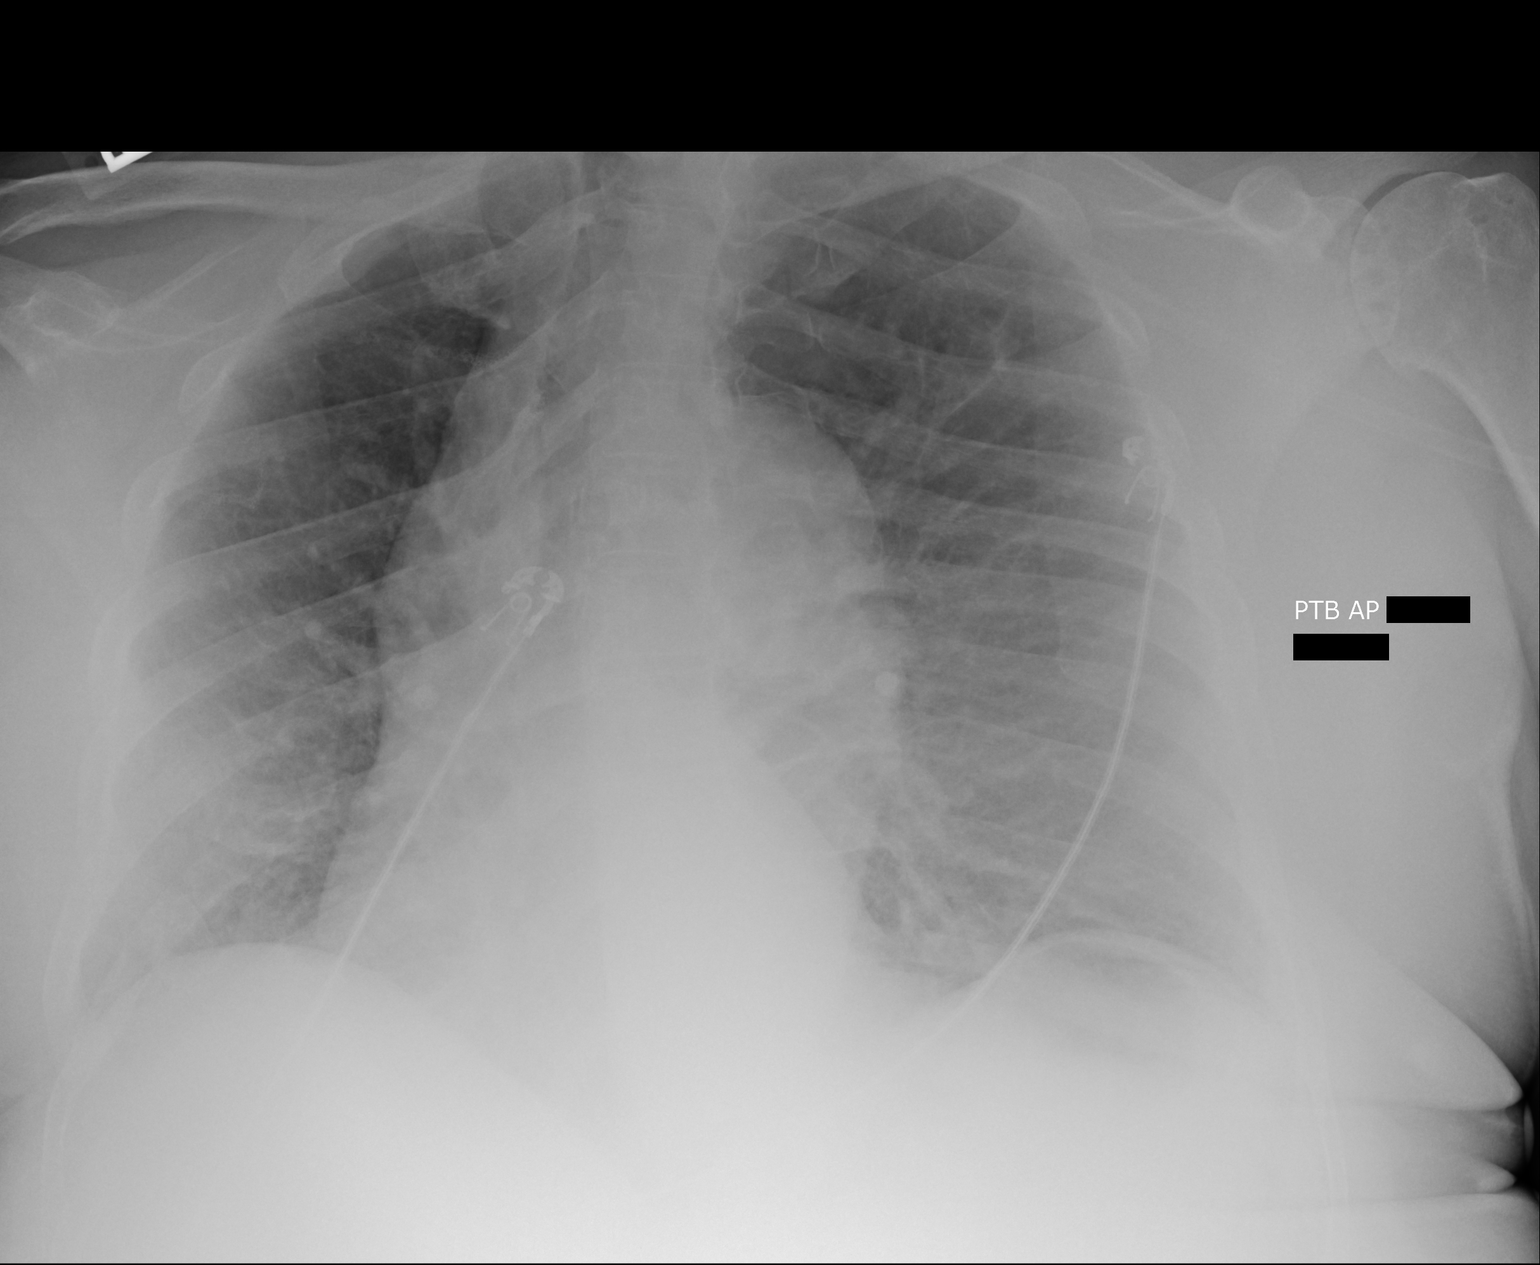

[1 of 1 positions shown; findings below may reference images not displayed]

FINDINGS: Heart size is normal.  The aorta is unfolded.  I think
there is pulmonary venous hypertension.  There are chronically
abnormal interstitial lung markings.  Cannot rule out early
interstitial edema.  No consolidation, collapse or effusion.  No
acute bony finding.
IMPRESSION: Possible pulmonary venous hypertension.  Chronic prominent
interstitial markings consistent with chronic lung disease.  Cannot
rule out an element of interstitial edema.

Unfolded aorta.

## 2014-03-10 IMAGING — CT CT ABD-PELV W/O CM
2 of 4 series · 17 of 46 positions shown, 19 images · non-contrast
Comparison: 08/11/2012

CLINICAL DATA: Abdominal pain, cough, chills.  Chronic renal
insufficiency, hypertension.

CT ABDOMEN AND PELVIS WITHOUT CONTRAST
TECHNIQUE: Multidetector CT imaging of the abdomen and pelvis was
performed following the standard protocol without intravenous
contrast.

[Series 2: abdomen/pelvis w/o contrast · axial · non-contrast · 0.90mm/px · z∈[-452,-18]mm · 14 of 101 slices shown, 16 images]
[im 7/101  soft-tissue]
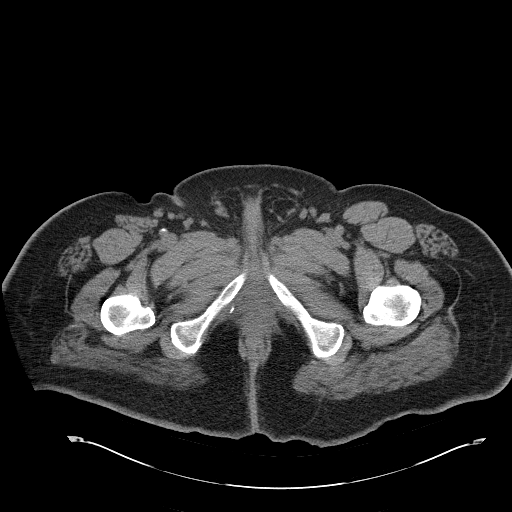
[im 7/101  bone]
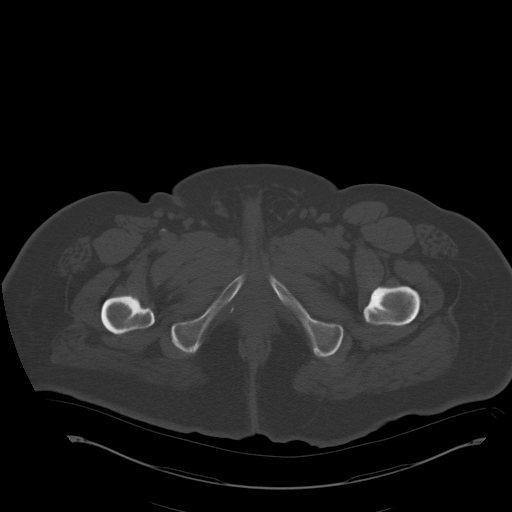
[im 14/101  soft-tissue]
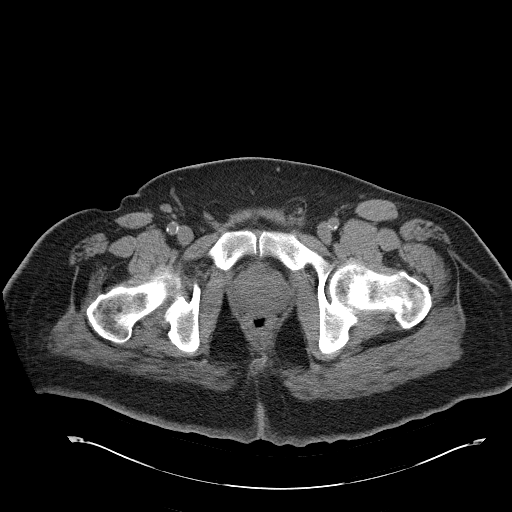
[im 21/101  soft-tissue]
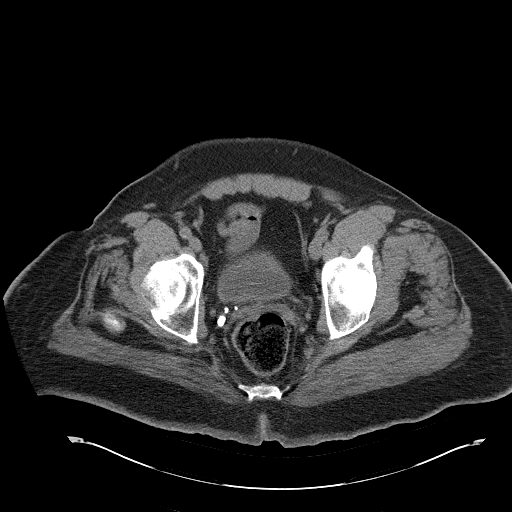
[im 27/101  soft-tissue]
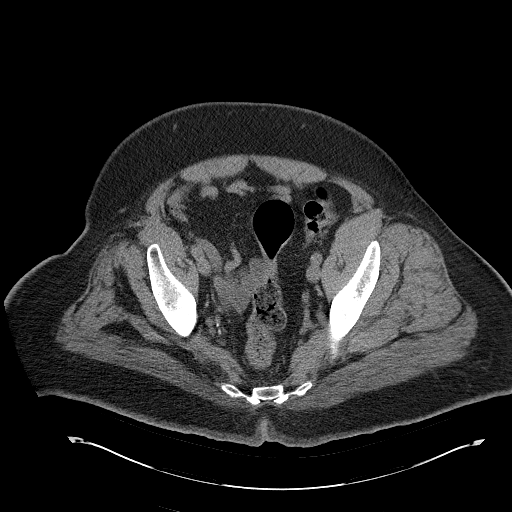
[im 34/101  soft-tissue]
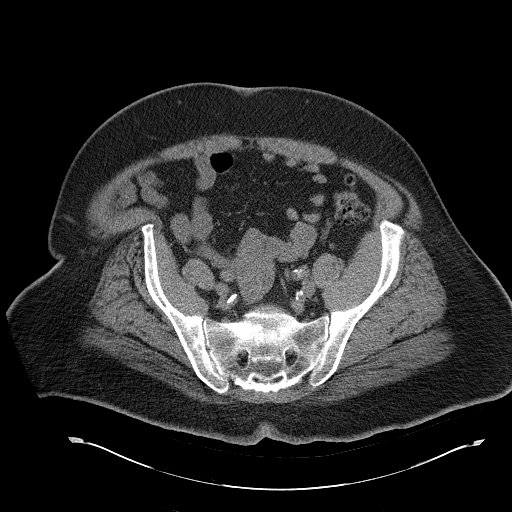
[im 41/101  soft-tissue]
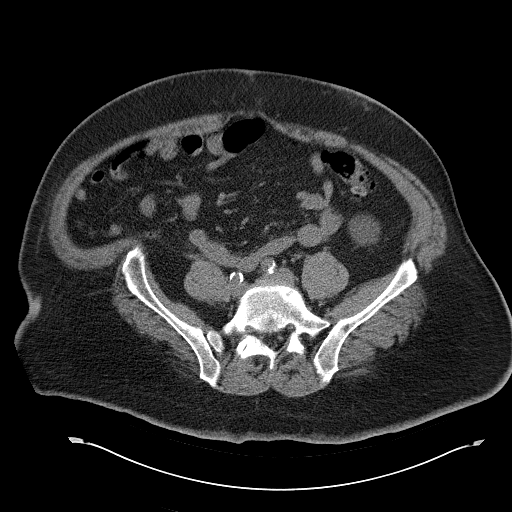
[im 47/101  soft-tissue]
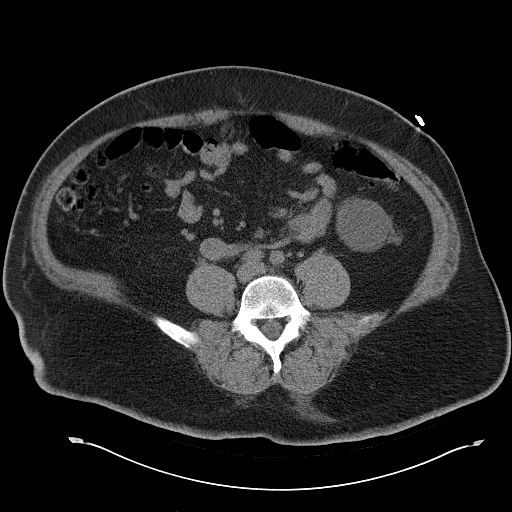
[im 54/101  soft-tissue]
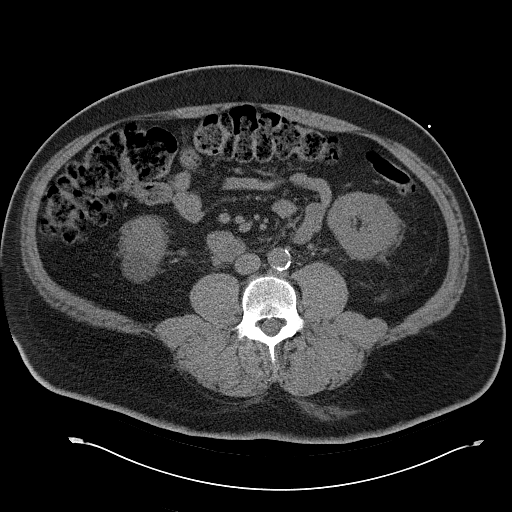
[im 61/101  soft-tissue]
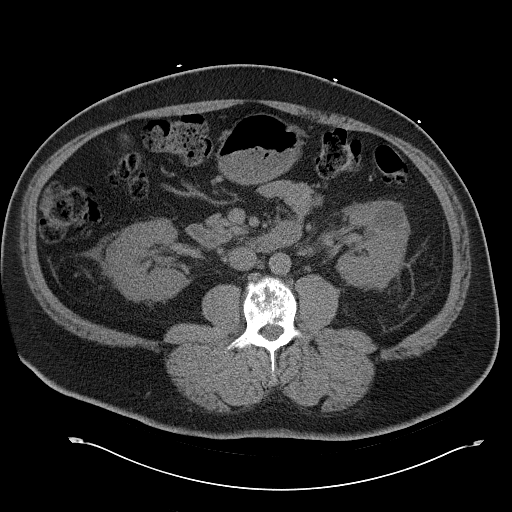
[im 61/101  bone]
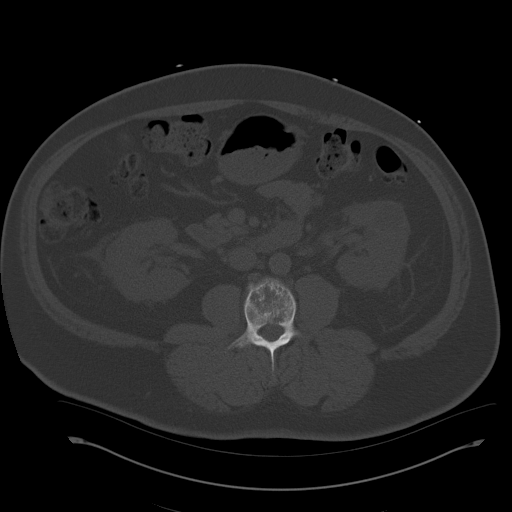
[im 67/101  soft-tissue]
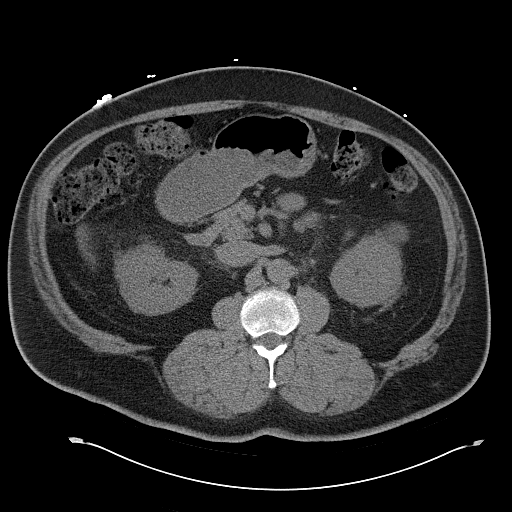
[im 74/101  soft-tissue]
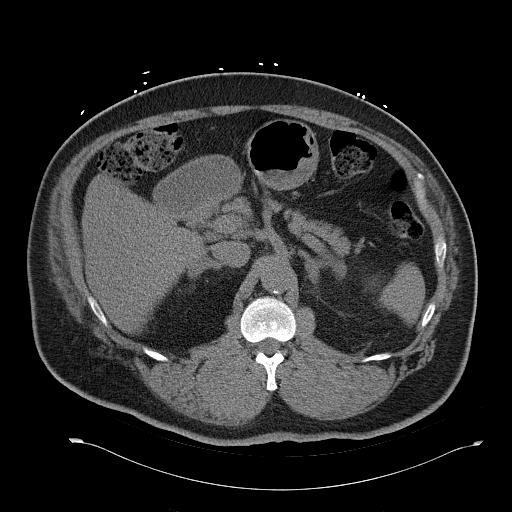
[im 81/101  soft-tissue]
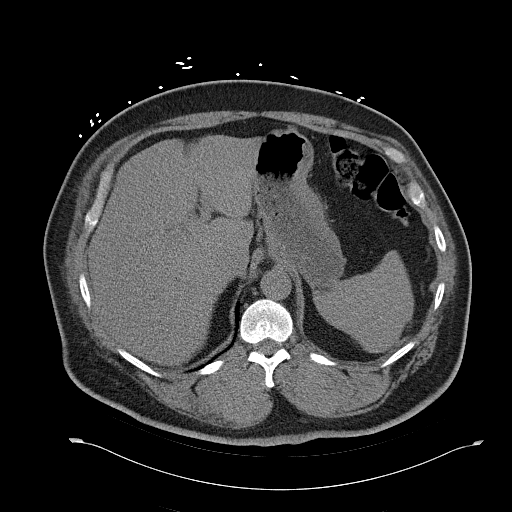
[im 87/101  soft-tissue]
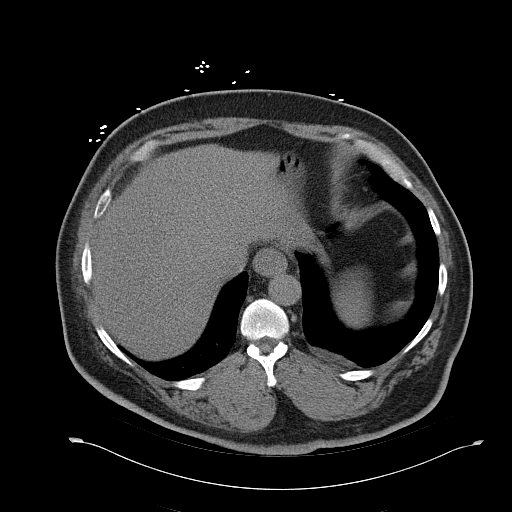
[im 94/101  soft-tissue]
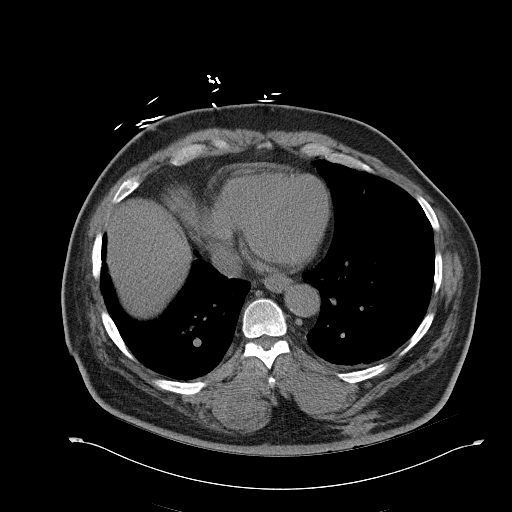

[Series 4: mpr cor (id) · coronal · 0.94mm/px · 3 of 92 slices shown]
[im 31/92  soft-tissue]
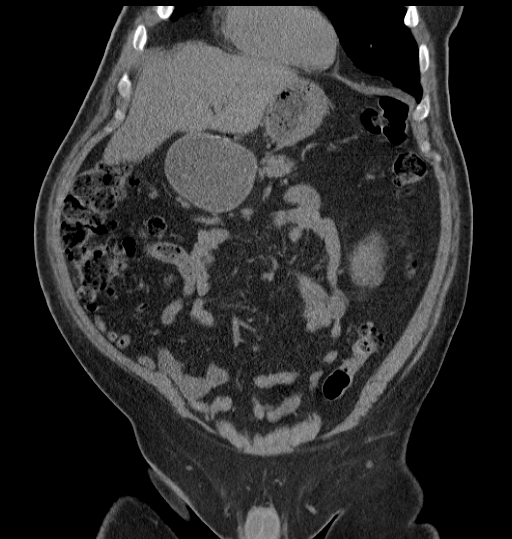
[im 41/92  soft-tissue]
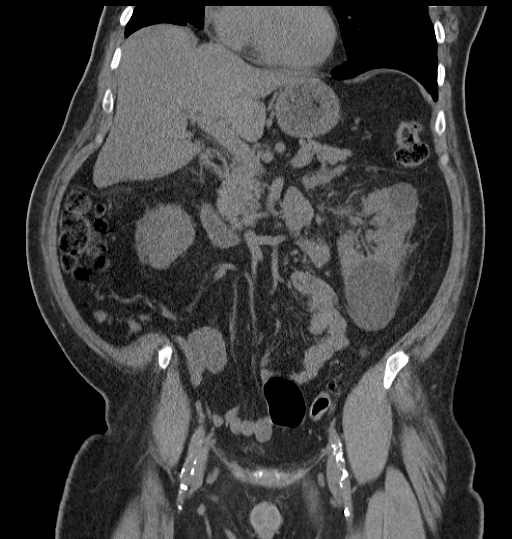
[im 51/92  soft-tissue]
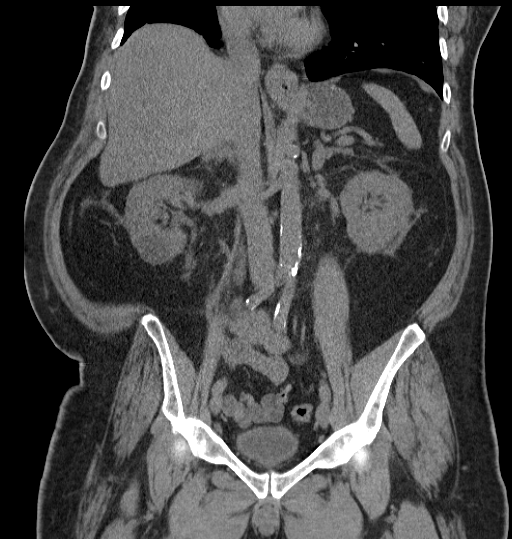

[17 of 46 positions shown; findings below may reference images not displayed]

FINDINGS: Chronic pleural thickening posteriorly at the left lung base.
Surgical clips in the gallbladder fossa.  Unremarkable uninfused
evaluation of liver, spleen, adrenal glands, pancreas.  Stable low-
attenuation renal lesions corresponding to a cyst previously
identified.  No hydronephrosis.  Streaky inflammatory/edematous
changes around bilateral kidneys, more conspicuous than on prior
exam.  No hydronephrosis or ureterectasis.  No nephrolithiasis.
Patchy aortoiliac arterial calcifications without aneurysm.
Stomach, small bowel, and colon are nondilated.  Urinary bladder
incompletely distended.  Moderate prostatic enlargement.  The
stable coarse right pelvic vascular calcifications.  No ascites.
No free air.  No adenopathy.  Scattered ascending, descending and
descending colon diverticula without significant adjacent
inflammatory/edematous change.  Coarse sclerotic impairment of
appearance of the L3 vertebral body with compression deformity
involving the inferior endplates centrally without significant loss
of height, stable since previous exam.
IMPRESSION: 1.  No acute abdominal process.
2.  Ascending and descending colonic diverticulosis as before.

## 2014-03-11 IMAGING — CR DG CHEST 1V PORT
1 series · 1 of 1 positions shown · non-contrast
Comparison: July 24, 2013

CLINICAL DATA: Hypoxia

PORTABLE CHEST - 1 VIEW

[portable]
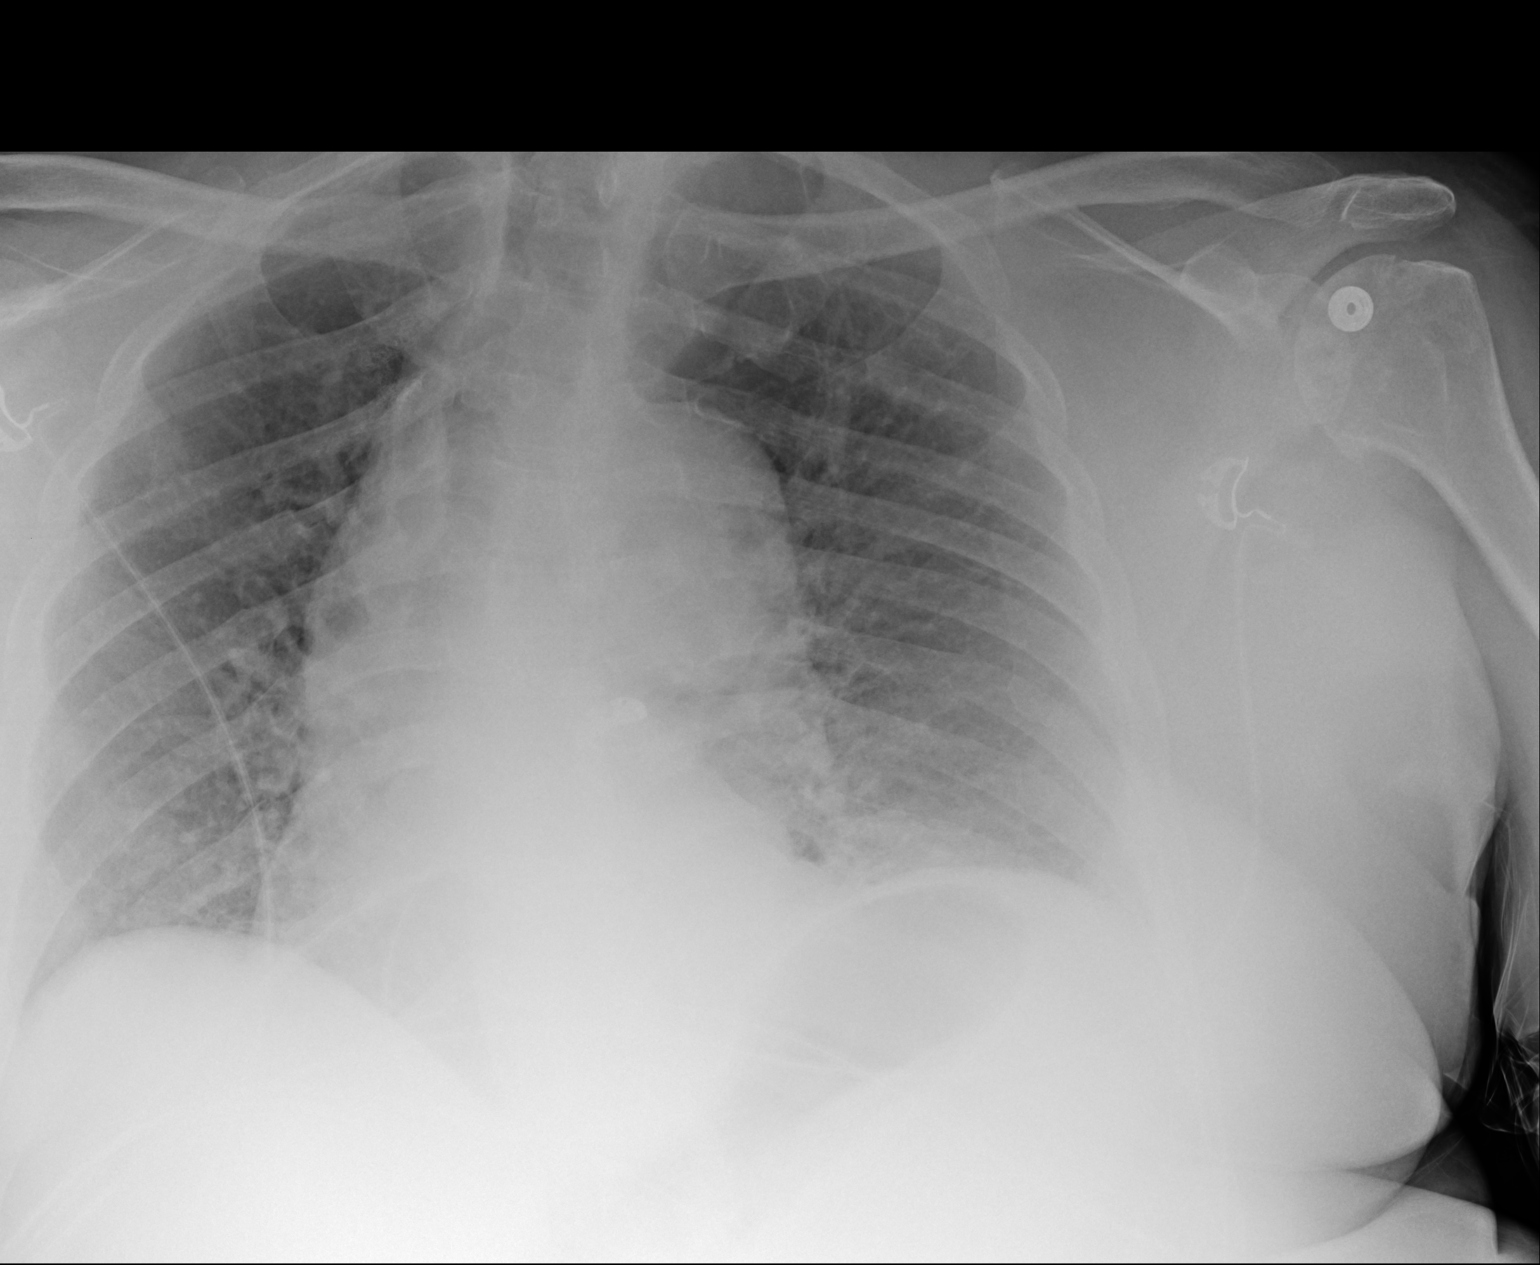

[1 of 1 positions shown; findings below may reference images not displayed]

FINDINGS: There is a new area of consolidation in the left base.
Elsewhere, there is mild interstitial prominence which is stable
but no consolidation.  Heart is upper normal in size.  The
pulmonary vascularity appears consistent with a degree of pulmonary
venous hypertension, stable.  No adenopathy.  Aorta is mildly
prominent but stable.  No pneumothorax.
IMPRESSION: New small area of consolidation left base.  Question a degree of
underlying volume overload.  Prominence of the aorta may reflect
chronic hypertensive change.

## 2014-03-12 IMAGING — CR DG ABD PORTABLE 1V
2 series · 2 of 2 positions shown · non-contrast
Comparison: 07/24/2013 CT.

CLINICAL DATA: Vomiting and fever.

PORTABLE ABDOMEN - 1 VIEW

[supine ap (1 of 2)]
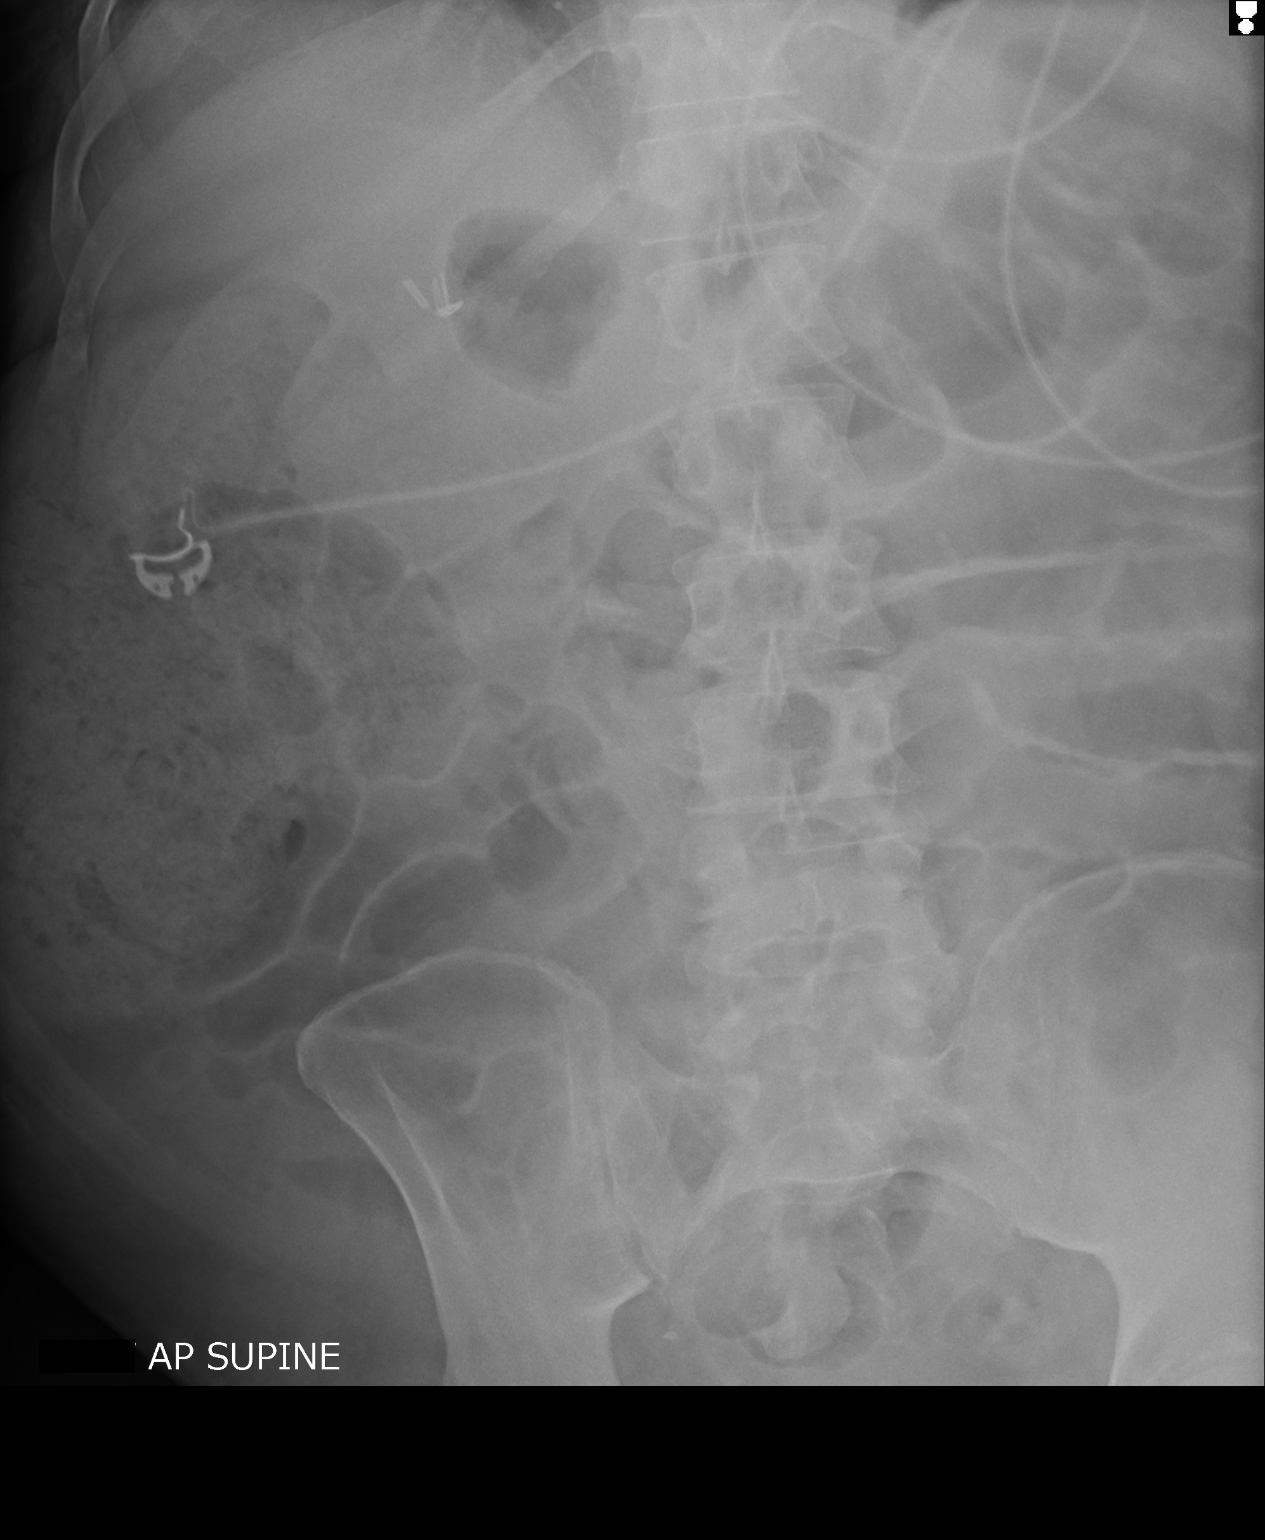

[supine ap (2 of 2)]
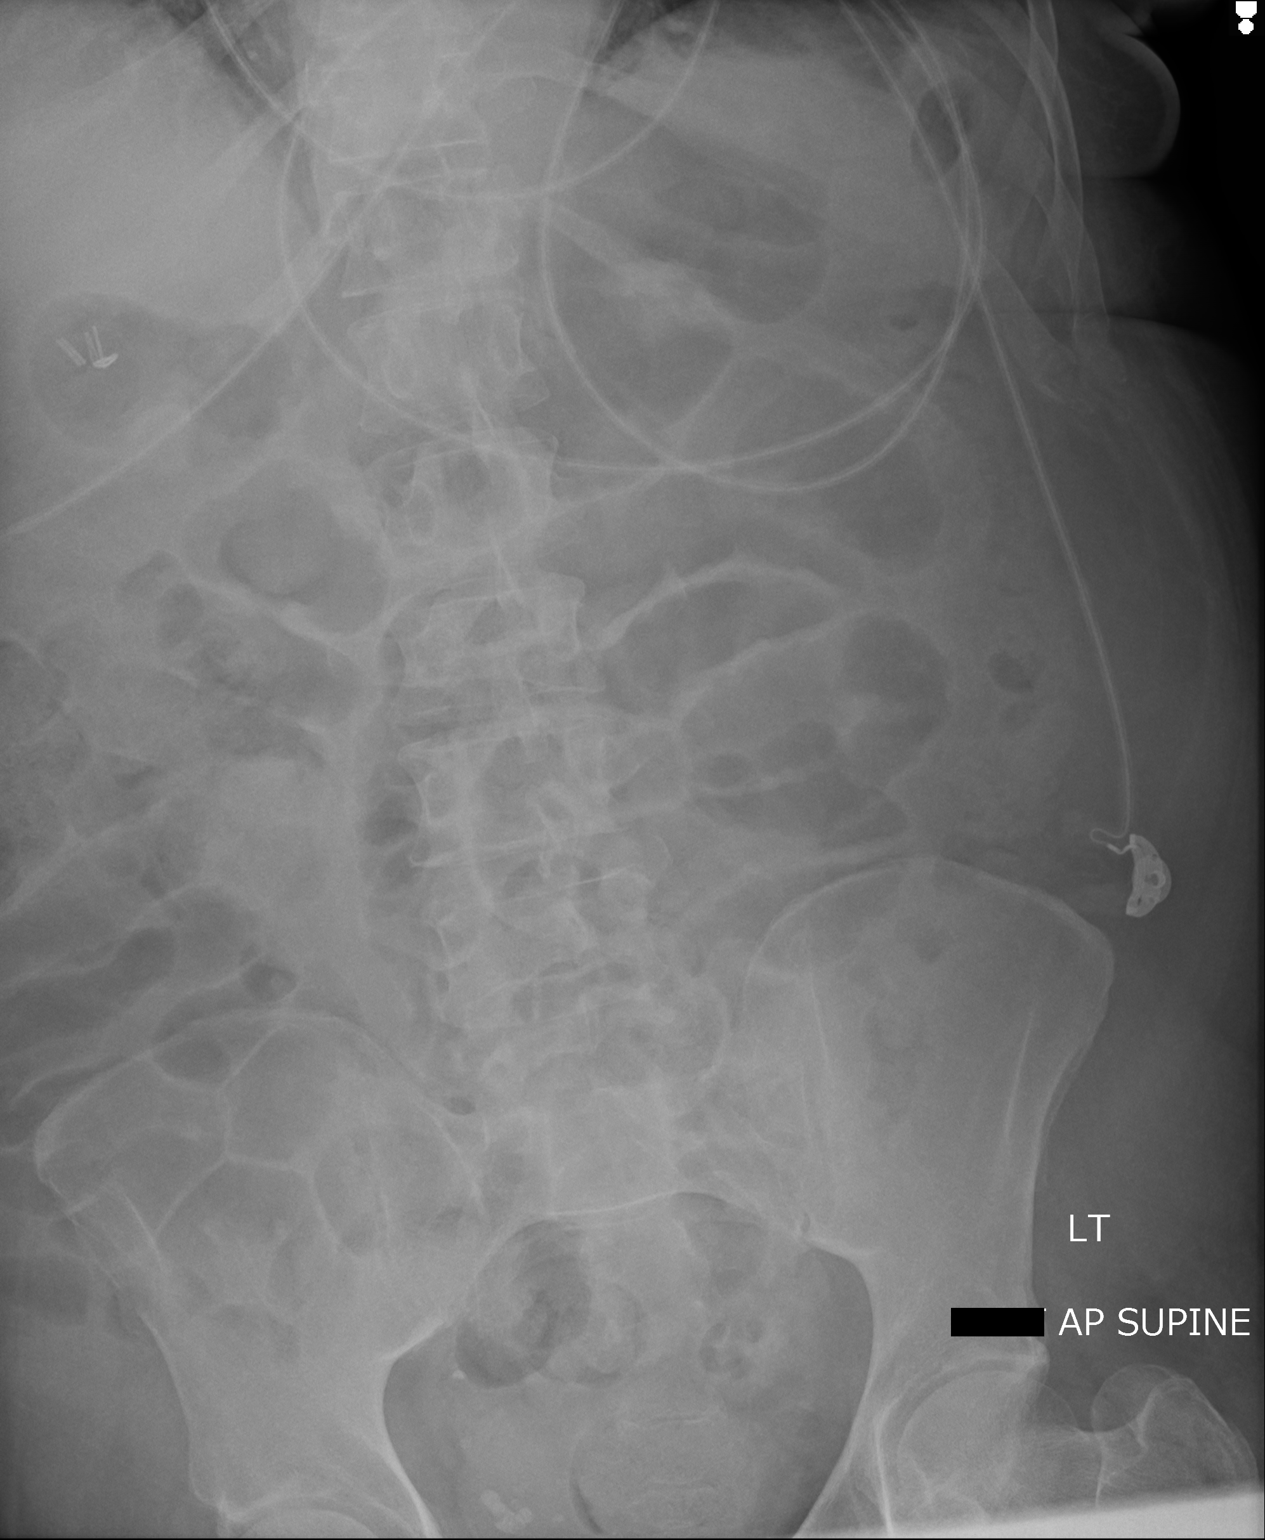

[2 of 2 positions shown; findings below may reference images not displayed]

FINDINGS: Interval development of gas filled slightly prominent
size small bowel loops measuring up to 3.3 cm.  Gas and partially
stool filled colon.  Appearance may be related to an ileus.

The possibility of free intraperitoneal air cannot be addressed on
a supine view..

Post cholecystectomy.
IMPRESSION: Interval development of gas filled slightly prominent size small
bowel loops measuring up to 3.3 cm.  Gas and partially stool filled
colon.  Appearance may be related to an ileus.

## 2014-03-14 IMAGING — CR DG CHEST 1V PORT
1 series · 1 of 1 positions shown · non-contrast
Comparison: 07/25/2013

CLINICAL DATA: Hypoxia.

PORTABLE CHEST - 1 VIEW

[portable]
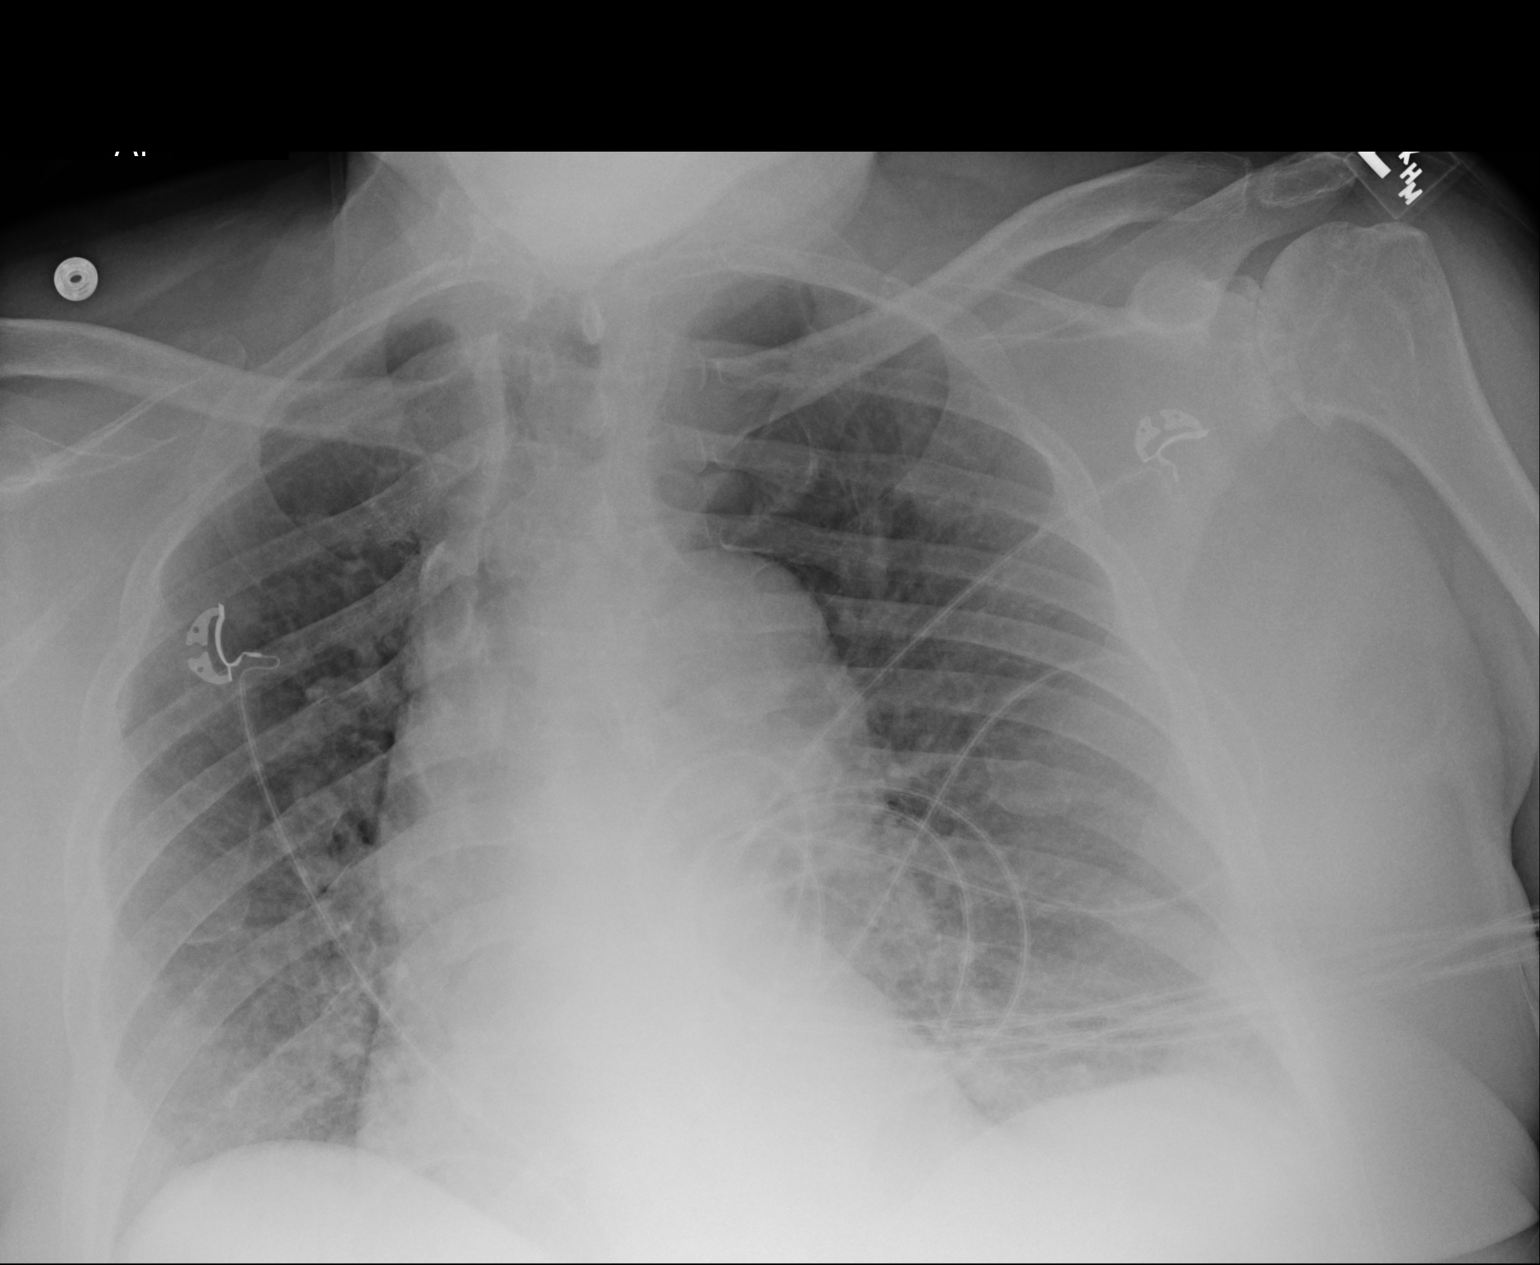

[1 of 1 positions shown; findings below may reference images not displayed]

FINDINGS: Prominent descending thoracic aorta appears unchanged.
Again noted are slightly prominent lung markings which could
represent mild edema or vascular congestion.  No focal airspace
disease or consolidation.  Heart size is grossly stable.
IMPRESSION: Stable chest radiograph findings. There may be mild vascular
congestion.

## 2014-03-20 ENCOUNTER — Other Ambulatory Visit: Payer: Self-pay

## 2014-03-21 ENCOUNTER — Telehealth: Payer: Self-pay | Admitting: *Deleted

## 2014-03-21 NOTE — Telephone Encounter (Signed)
Message copied by Sheral Flow on Fri Mar 21, 2014 12:47 PM ------      Message from: Devoria Glassing      Created: Fri Mar 21, 2014 11:47 AM       [employer] calling to talk with you about a form about patient that dr Buelah Manis made a correction on please call back at [phone] ------

## 2014-03-21 NOTE — Telephone Encounter (Signed)
Returned call to [employer] with [employer].   Reported that MD made correction on date of 7 element written order.   Insurance requires full signature next to corrections.   Requested to have DME fax form back to [facility] and corrections will be made.

## 2014-03-25 ENCOUNTER — Other Ambulatory Visit: Payer: Self-pay | Admitting: *Deleted

## 2014-03-25 ENCOUNTER — Telehealth: Payer: Self-pay | Admitting: Family Medicine

## 2014-03-25 MED ORDER — ALLOPURINOL 300 MG PO TABS: ORAL_TABLET | ORAL | Status: DC

## 2014-03-25 MED ORDER — TIZANIDINE HCL 2 MG PO TABS: ORAL_TABLET | ORAL | Status: DC

## 2014-03-25 NOTE — Telephone Encounter (Signed)
Prescriptions sent to pharmacy

## 2014-03-25 NOTE — Telephone Encounter (Signed)
Wife calling.  Upset about having not received powerchair yet.  Also needing new written refill RX for Percocet.  States refills were sent to mail order for Allopurinol and Tizanidine and were not for 90 day supply.  Asks that his be corrected.  I did call [employer], they were suppose to be returning powerchair form to [place] for corrections and we have not received.  They are going to refax today.

## 2014-03-26 ENCOUNTER — Telehealth: Payer: Self-pay | Admitting: *Deleted

## 2014-03-26 MED ORDER — OXYCODONE-ACETAMINOPHEN 7.5-325 MG PO TABS: 1.0000 | ORAL_TABLET | ORAL | Status: DC | PRN

## 2014-03-26 NOTE — Telephone Encounter (Signed)
Prescription printed and patient made aware to come to office to pick up.

## 2014-03-26 NOTE — Telephone Encounter (Signed)
Returned call to patient wife, [name]. [place].

## 2014-03-26 NOTE — Telephone Encounter (Signed)
Message copied by Sheral Flow on Wed Mar 26, 2014 10:51 AM ------      Message from: Lenore Manner      Created: Wed Mar 26, 2014 10:46 AM      Regarding: rtn call       Contact: [phone]       PT wife is calling back states she did not hear back from you yesterday and was following up  ------

## 2014-03-26 NOTE — Telephone Encounter (Signed)
Received call from patient wife. Requested refill on Ocycodone.   Last refill given on 12/10/2013 by Dr. Aline Brochure, orthopedic with qty of 180.  Ok to refill?

## 2014-03-26 NOTE — Telephone Encounter (Signed)
Okay to refill? 

## 2014-03-26 NOTE — Telephone Encounter (Signed)
Patient returned call and made aware.

## 2014-04-07 ENCOUNTER — Ambulatory Visit: Payer: Medicare HMO | Admitting: Family Medicine

## 2014-04-09 ENCOUNTER — Telehealth: Payer: Self-pay | Admitting: *Deleted

## 2014-04-09 NOTE — Telephone Encounter (Signed)
noted 

## 2014-04-09 NOTE — Telephone Encounter (Signed)
Message copied by Sheral Flow on Wed Apr 09, 2014 10:38 AM ------      Message from: Devoria Glassing      Created: Wed Apr 09, 2014 10:05 AM       Requesting a call back from you but did not specify in message what the reason was phone number is [phone] ------

## 2014-04-09 NOTE — Telephone Encounter (Signed)
Received call from patient wife.   Reported that [facility] nurse was interested in contacting DME provider for power wheel chair.   Advised that [employer] is handling claim and contact number is [phone].  Call placed to DME provider and states that insurance approved order on 04/08/2014, and wheel chair should be delivered in 2-3 weeks.   Call placed to patient and patient wife made aware.

## 2014-05-12 ENCOUNTER — Ambulatory Visit (INDEPENDENT_AMBULATORY_CARE_PROVIDER_SITE_OTHER): Payer: Commercial Managed Care - HMO | Admitting: Podiatry

## 2014-05-12 ENCOUNTER — Encounter: Payer: Self-pay | Admitting: Podiatry

## 2014-05-12 VITALS — BP 142/89 | HR 69 | Resp 18

## 2014-05-12 DIAGNOSIS — M79609 Pain in unspecified limb: Secondary | ICD-10-CM

## 2014-05-12 DIAGNOSIS — B351 Tinea unguium: Secondary | ICD-10-CM

## 2014-05-13 NOTE — Progress Notes (Signed)
Patient ID: [name], male   DOB: [birth year], 68 y.o.   MRN: [id number]  Subjective: Orientated x3 black male presents with wife requesting nail debridement. He also mentions generalized pain in his feet.  Objective: Patient transfers from wheelchair to treatment chair Elongated, hypertrophic, discolored toenails x10 No skin lesions are noted. No erythema, edema or warmth noted bilaterally  Assessment: Symptomatic onychomycoses x10 foot Diffuse foot pain bilaterally associated with neuropathy  Plan: Nails x10 are debrided Patient advise symptoms are most consistent with neuropathic pain  Reappoint at three-month intervals or sooner if patient has concern

## 2014-05-14 ENCOUNTER — Telehealth: Payer: Self-pay | Admitting: *Deleted

## 2014-05-14 NOTE — Telephone Encounter (Signed)
Authorization for [facility] silverback care Mgmt Josem Kaufmann #[id number] for Dr. Amalia Hailey [facility].

## 2014-05-15 ENCOUNTER — Other Ambulatory Visit: Payer: Self-pay | Admitting: Family Medicine

## 2014-05-15 NOTE — Telephone Encounter (Signed)
Refill appropriate and filled per protocol. 

## 2014-05-19 ENCOUNTER — Telehealth: Payer: Self-pay | Admitting: Family Medicine

## 2014-05-19 MED ORDER — GABAPENTIN 300 MG PO CAPS: 600.0000 mg | ORAL_CAPSULE | Freq: Three times a day (TID) | ORAL | Status: DC

## 2014-05-19 NOTE — Telephone Encounter (Signed)
[phone]  PT is needing a new prescription because they have changed insurance companies   gabapentin (NEURONTIN) 300 MG capsule  [facility]

## 2014-05-19 NOTE — Telephone Encounter (Signed)
Prescription sent to pharmacy.

## 2014-05-28 ENCOUNTER — Other Ambulatory Visit: Payer: Self-pay | Admitting: Family Medicine

## 2014-05-28 NOTE — Telephone Encounter (Signed)
Refill appropriate and filled per protocol. 

## 2014-06-02 ENCOUNTER — Encounter (HOSPITAL_COMMUNITY): Payer: Self-pay | Admitting: Oncology

## 2014-06-16 ENCOUNTER — Ambulatory Visit: Payer: Medicare HMO | Admitting: Family Medicine

## 2014-06-17 ENCOUNTER — Telehealth: Payer: Self-pay | Admitting: *Deleted

## 2014-06-17 NOTE — Telephone Encounter (Signed)
We have samples of Tradjenta 64m and Humalog, however, no samples of Lantus are available. All medications are approved by insurance.  Is there a cheaper alternative?  MD please advise.

## 2014-06-17 NOTE — Telephone Encounter (Signed)
Call placed to patient and patient spouse made aware.

## 2014-06-17 NOTE — Telephone Encounter (Signed)
Message copied by Sheral Flow on Tue Jun 17, 2014  9:42 AM ------      Message from: Lenore Manner      Created: Tue Jun 17, 2014  8:35 AM      Regarding: Med samples      Contact: [phone]       PT wife has called and stated that the insulin glargine (LANTUS) 100 UNIT/ML injection, insulin lispro (HUMALOG) 100 UNIT/ML injection, and  linagliptin (TRADJENTA) 5 MG TABS tablet are becoming to expensive and they are wanting to know if we have samples? I wasn't sure were all of these were and we were all busy at the time I told her I would call back if we did. Could you just let me know were these are at if we have any and I will be more than happy to call them back ------

## 2014-06-17 NOTE — Telephone Encounter (Signed)
Please have pt wife call his endocrinologist Dr. Dorris Fetch They may have samples of Lantus

## 2014-06-18 ENCOUNTER — Ambulatory Visit: Payer: Commercial Managed Care - HMO | Admitting: Family Medicine

## 2014-06-20 ENCOUNTER — Emergency Department (HOSPITAL_COMMUNITY): Payer: Medicare HMO

## 2014-06-20 ENCOUNTER — Emergency Department (HOSPITAL_COMMUNITY)
Admission: EM | Admit: 2014-06-20 | Discharge: 2014-06-20 | Disposition: A | Discharge status: Home / Self Care | Payer: Medicare HMO | Attending: Emergency Medicine | Admitting: Emergency Medicine

## 2014-06-20 ENCOUNTER — Encounter (HOSPITAL_COMMUNITY): Payer: Self-pay | Admitting: Emergency Medicine

## 2014-06-20 DIAGNOSIS — Z792 Long term (current) use of antibiotics: Secondary | ICD-10-CM | POA: Insufficient documentation

## 2014-06-20 DIAGNOSIS — Z923 Personal history of irradiation: Secondary | ICD-10-CM | POA: Insufficient documentation

## 2014-06-20 DIAGNOSIS — Z9889 Other specified postprocedural states: Secondary | ICD-10-CM | POA: Insufficient documentation

## 2014-06-20 DIAGNOSIS — E119 Type 2 diabetes mellitus without complications: Secondary | ICD-10-CM | POA: Insufficient documentation

## 2014-06-20 DIAGNOSIS — Z794 Long term (current) use of insulin: Secondary | ICD-10-CM | POA: Diagnosis not present

## 2014-06-20 DIAGNOSIS — Z79899 Other long term (current) drug therapy: Secondary | ICD-10-CM | POA: Diagnosis not present

## 2014-06-20 DIAGNOSIS — Z8673 Personal history of transient ischemic attack (TIA), and cerebral infarction without residual deficits: Secondary | ICD-10-CM | POA: Diagnosis not present

## 2014-06-20 DIAGNOSIS — M129 Arthropathy, unspecified: Secondary | ICD-10-CM | POA: Insufficient documentation

## 2014-06-20 DIAGNOSIS — E785 Hyperlipidemia, unspecified: Secondary | ICD-10-CM | POA: Diagnosis not present

## 2014-06-20 DIAGNOSIS — J45909 Unspecified asthma, uncomplicated: Secondary | ICD-10-CM | POA: Insufficient documentation

## 2014-06-20 DIAGNOSIS — N289 Disorder of kidney and ureter, unspecified: Secondary | ICD-10-CM

## 2014-06-20 DIAGNOSIS — Z85819 Personal history of malignant neoplasm of unspecified site of lip, oral cavity, and pharynx: Secondary | ICD-10-CM | POA: Insufficient documentation

## 2014-06-20 DIAGNOSIS — N390 Urinary tract infection, site not specified: Secondary | ICD-10-CM | POA: Insufficient documentation

## 2014-06-20 DIAGNOSIS — H409 Unspecified glaucoma: Secondary | ICD-10-CM | POA: Diagnosis not present

## 2014-06-20 DIAGNOSIS — Z87828 Personal history of other (healed) physical injury and trauma: Secondary | ICD-10-CM | POA: Diagnosis not present

## 2014-06-20 DIAGNOSIS — R109 Unspecified abdominal pain: Secondary | ICD-10-CM | POA: Diagnosis present

## 2014-06-20 DIAGNOSIS — Z860100 Personal history of colon polyps, unspecified: Secondary | ICD-10-CM | POA: Insufficient documentation

## 2014-06-20 DIAGNOSIS — R809 Proteinuria, unspecified: Secondary | ICD-10-CM

## 2014-06-20 DIAGNOSIS — G8929 Other chronic pain: Secondary | ICD-10-CM | POA: Insufficient documentation

## 2014-06-20 DIAGNOSIS — Z9089 Acquired absence of other organs: Secondary | ICD-10-CM | POA: Diagnosis not present

## 2014-06-20 DIAGNOSIS — K219 Gastro-esophageal reflux disease without esophagitis: Secondary | ICD-10-CM | POA: Diagnosis not present

## 2014-06-20 DIAGNOSIS — D72829 Elevated white blood cell count, unspecified: Secondary | ICD-10-CM | POA: Diagnosis not present

## 2014-06-20 DIAGNOSIS — Z87891 Personal history of nicotine dependence: Secondary | ICD-10-CM | POA: Diagnosis not present

## 2014-06-20 DIAGNOSIS — I10 Essential (primary) hypertension: Secondary | ICD-10-CM | POA: Diagnosis not present

## 2014-06-20 LAB — CBC WITH DIFFERENTIAL/PLATELET
Basophils Absolute: 0 10*3/uL (ref 0.0–0.1)
Basophils Relative: 0 % (ref 0–1)
Eosinophils Absolute: 0.6 10*3/uL (ref 0.0–0.7)
Eosinophils Relative: 4 % (ref 0–5)
HEMATOCRIT: 45.1 % (ref 39.0–52.0)
Hemoglobin: 15 g/dL (ref 13.0–17.0)
LYMPHS PCT: 9 % — AB (ref 12–46)
Lymphs Abs: 1.3 10*3/uL (ref 0.7–4.0)
MCH: 24.8 pg — ABNORMAL LOW (ref 26.0–34.0)
MCHC: 33.3 g/dL (ref 30.0–36.0)
MCV: 74.7 fL — ABNORMAL LOW (ref 78.0–100.0)
MONOS PCT: 8 % (ref 3–12)
Monocytes Absolute: 1.1 10*3/uL — ABNORMAL HIGH (ref 0.1–1.0)
Neutro Abs: 11.2 10*3/uL — ABNORMAL HIGH (ref 1.7–7.7)
Neutrophils Relative %: 79 % — ABNORMAL HIGH (ref 43–77)
PLATELETS: 252 10*3/uL (ref 150–400)
RBC: 6.04 MIL/uL — AB (ref 4.22–5.81)
RDW: 16.7 % — ABNORMAL HIGH (ref 11.5–15.5)
WBC: 14.2 10*3/uL — AB (ref 4.0–10.5)

## 2014-06-20 LAB — URINALYSIS, ROUTINE W REFLEX MICROSCOPIC
Bilirubin Urine: NEGATIVE
Glucose, UA: NEGATIVE mg/dL
Ketones, ur: NEGATIVE mg/dL
NITRITE: NEGATIVE
PH: 6 (ref 5.0–8.0)
Protein, ur: 100 mg/dL — AB
SPECIFIC GRAVITY, URINE: 1.02 (ref 1.005–1.030)
Urobilinogen, UA: 0.2 mg/dL (ref 0.0–1.0)

## 2014-06-20 LAB — COMPREHENSIVE METABOLIC PANEL
ALBUMIN: 4 g/dL (ref 3.5–5.2)
ALK PHOS: 100 U/L (ref 39–117)
ALT: 18 U/L (ref 0–53)
AST: 16 U/L (ref 0–37)
Anion gap: 13 (ref 5–15)
BILIRUBIN TOTAL: 0.2 mg/dL — AB (ref 0.3–1.2)
BUN: 28 mg/dL — ABNORMAL HIGH (ref 6–23)
CHLORIDE: 103 meq/L (ref 96–112)
CO2: 25 mEq/L (ref 19–32)
Calcium: 10.8 mg/dL — ABNORMAL HIGH (ref 8.4–10.5)
Creatinine, Ser: 1.39 mg/dL — ABNORMAL HIGH (ref 0.50–1.35)
GFR calc Af Amer: 59 mL/min — ABNORMAL LOW (ref >90)
GFR calc non Af Amer: 51 mL/min — ABNORMAL LOW (ref >90)
Glucose, Bld: 153 mg/dL — ABNORMAL HIGH (ref 70–99)
POTASSIUM: 4.3 meq/L (ref 3.7–5.3)
SODIUM: 141 meq/L (ref 137–147)
Total Protein: 8.3 g/dL (ref 6.0–8.3)

## 2014-06-20 LAB — URINE MICROSCOPIC-ADD ON

## 2014-06-20 LAB — LIPASE, BLOOD: LIPASE: 20 U/L (ref 11–59)

## 2014-06-20 MED ORDER — MORPHINE SULFATE 4 MG/ML IJ SOLN
4.0000 mg | Freq: Once | INTRAMUSCULAR | Status: AC
  Administered 2014-06-20: 4 mg via INTRAVENOUS
  Filled 2014-06-20: qty 1

## 2014-06-20 MED ORDER — CEPHALEXIN 500 MG PO CAPS: 500.0000 mg | ORAL_CAPSULE | Freq: Four times a day (QID) | ORAL | Status: DC

## 2014-06-20 MED ORDER — ONDANSETRON HCL 4 MG PO TABS: 4.0000 mg | ORAL_TABLET | Freq: Three times a day (TID) | ORAL | Status: DC | PRN

## 2014-06-20 MED ORDER — SODIUM CHLORIDE 0.9 % IV SOLN
Freq: Once | INTRAVENOUS | Status: AC
  Administered 2014-06-20: 02:00:00 via INTRAVENOUS

## 2014-06-20 MED ORDER — DEXTROSE 5 % IV SOLN
1.0000 g | Freq: Once | INTRAVENOUS | Status: AC
  Administered 2014-06-20: 1 g via INTRAVENOUS
  Filled 2014-06-20: qty 10

## 2014-06-20 MED ORDER — HYDROCODONE-ACETAMINOPHEN 5-325 MG PO TABS: 2.0000 | ORAL_TABLET | ORAL | Status: DC | PRN

## 2014-06-20 MED ORDER — ONDANSETRON HCL 4 MG/2ML IJ SOLN
4.0000 mg | Freq: Once | INTRAMUSCULAR | Status: AC
  Administered 2014-06-20: 4 mg via INTRAVENOUS
  Filled 2014-06-20: qty 2

## 2014-06-20 NOTE — Discharge Instructions (Signed)
Your testing shows that you have a Urinary infection which has caused some protein in your urine - this should be rechecked in 7 days.  Your CT scan does not show any other acute or new findings.    Keflex for 7 days (4 times a day)  Zofran for nausea  Hydrocodone for severe pain  Stay hydrated!

## 2014-06-20 NOTE — ED Provider Notes (Signed)
CSN: [id number]     Arrival date & time 06/20/14  0042 History   First MD Initiated Contact with Patient 06/20/14 0108     Chief Complaint  Patient presents with  . Abdominal Pain     (Consider location/radiation/quality/duration/timing/severity/associated sxs/prior Treatment) HPI Comments: 68 year old male with a history of stroke affecting his right side as well as a history of chronic abdominal pain. He has diabetes and approximately one year ago was admitted to the hospital with a hemorrhagic colitis from which she completely healed. He states that this evening he developed recurrent abdominal pain which was acute in onset, located in the right midabdomen and right flank, no radiation but was associated with nausea, vomiting times one and in voluntary shaking of his right upper extremity (affected by stroke in the past).  Currently he denies pain but states he has significant nausea. He does have a history of urinary tract infections and does endorse some dysuria but the patient is incontinent of urine at baseline. There has been no fevers or chills.  Patient is a 68 y.o. male presenting with abdominal pain. The history is provided by the patient, a relative and medical records.  Abdominal Pain   Past Medical History  Diagnosis Date  . Barrett's esophagus     EGD 03/23/2011 & EGD 2/09 bx proven  . Steatohepatitis     liver biopsy 2009  . DM (diabetes mellitus)   . Diverticulosis     TCS 03/23/11 pancolonic diverticula &TCS 5/08, pancolonic diverticula  . GERD (gastroesophageal reflux disease)   . HTN (hypertension)   . CVA (cerebral infarction) 1998    right sided deficit  . Gout   . Lymphoma 1974    XRT at [facility], right base of skull area  . Hepatitis     esosiniphilic, tx with prednisone  . Renal insufficiency   . 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 Hiatal hernia   . Colon polyp 03/23/2011    tubular adenoma, Dr. Gala Romney  . Hemorrhoids, internal  03/23/2011    tcs by Dr. Gala Romney  . Hyperlipidemia   . Hemorrhagic colitis 06/06/2012.  . Glaucoma (increased eye pressure)   . Arthritis   . Asthma     "hx of"  . 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 Lower facial weakness     Right  . Esotropia of left eye   . Edema of lower extremity 12/21/12    bilateral   . BPH (benign prostatic hyperplasia)   . Hx of radiation therapy 1974    right base of skull area-lymphoma  . Stroke 1998    right hemiparesis/plegia  . Chronic abdominal pain    Past Surgical History  Procedure Laterality Date  . Cholecystectomy    . Right lymph node removal    . Right video-assisted thoracic surgery, pleurectomy, and pleurodesis  2011  . Esophagogastroduodenoscopy  02/05/08    goblet cell metaplasia/negative for H.pylori  . Colonoscopy  03/23/11    Dr. Gala Romney  pancolonic diverticula, hemorrhoids, tubular adenoma.. next tcs 03/2016  . Esophagogastroduodenoscopy  03/23/11    Dr. Gala Romney, barretts, hiatal hernia  . Mass biopsy  11/01/2012    Procedure: NECK MASS BIOPSY;  Surgeon: Ascencion Dike, MD;  Location: [facility];  Service: ENT;  Laterality: Right;  Excisional Bx Right Neck Mass; attempted external jugular cutdown of left side  . Parotidectomy  11/24/2012    Procedure: PAROTIDECTOMY;  Surgeon: Juliane Poot  Benjamine Mola, MD;  Location: [place];  Service: ENT;  Laterality: N/A;  Total parotidectomy  . Pleurectomy     Family History  Problem Relation Age of Onset  . Heart failure Father   . Heart failure Mother   . Heart failure Sister   . Heart failure Son    History  Substance Use Topics  . Smoking status: Former Smoker -- 3.00 packs/day    Types: Cigarettes    Quit date: 03/01/1997  . Smokeless tobacco: Never Used  . Alcohol Use: No    Review of Systems  Gastrointestinal: Positive for abdominal pain.  All other systems reviewed and are negative.     Allergies  Review of patient's allergies indicates no known allergies.  Home Medications    Prior to Admission medications   Medication Sig Start Date End Date Taking? Authorizing Provider  allopurinol (ZYLOPRIM) 300 MG tablet Take 1 tablet (300 mg  total) by mouth daily. 03/25/14  Yes [name], MD  amLODipine (NORVASC) 10 MG tablet Take 1 tablet by mouth  daily 02/11/14  Yes [name], MD  Blood Glucose Calibration [facility] CONTROL) LOW SOLN  02/24/14  Yes Historical Provider, MD  Blood Glucose Monitoring Suppl (EMBRACE BLOOD GLUCOSE MONITOR) [place]  02/24/14  Yes Historical Provider, MD  cloNIDine (CATAPRES) 0.2 MG tablet Take 1 tablet (0.2 mg total) by mouth 3 (three) times daily. 02/19/14  Yes [name], MD  [facility] BLOOD GLUCOSE TEST test strip  02/24/14  Yes Historical Provider, MD  febuxostat (ULORIC) 40 MG tablet Take 1 tablet by mouth   daily 02/19/14  Yes [name], MD  gabapentin (NEURONTIN) 300 MG capsule Take 2 capsules (600 mg total) by mouth 3 (three) times daily. 05/19/14  Yes [name], MD  hydrochlorothiazide (HYDRODIURIL) 25 MG tablet Take 1 tablet by mouth  daily 02/28/14  Yes [name], MD  insulin glargine (LANTUS) 100 UNIT/ML injection Inject 0.4 mL (40 Units total) into the skin at bedtime. 07/31/13  Yes Samuella Cota, MD  insulin lispro (HUMALOG) 100 UNIT/ML injection Inject 20 Units into the skin 3 (three) times daily before meals. Uses according to sliding scale at home. And 14 units QAC   Yes Historical Provider, MD  Lancet Devices (SIMPLE DIAGNOSTICS LANCING DEV) [place]  02/24/14  Yes Historical Provider, MD  linagliptin (TRADJENTA) 5 MG TABS tablet Take 1 tablet by mouth  daily 02/28/14  Yes [name], MD  lisinopril (PRINIVIL,ZESTRIL) 20 MG tablet Take 1 tablet by mouth  daily 02/28/14  Yes [name], MD  metoprolol succinate (TOPROL-XL) 50 MG 24 hr tablet TAKE 1 TABLET ONE TIME DAILY  WITH WITH A MEAL  OR  IMMEDIATELY FOLLOWING A MEAL  05/15/14  Yes [name], MD  nystatin (MYCOSTATIN/NYSTOP) 100000 UNIT/GM  POWD Apply to bilateral axilla twice a day as needed 10/12/13  Yes [name], MD  oxyCODONE-acetaminophen (PERCOCET) 7.5-325 MG per tablet Take 1 tablet by mouth every 4 (four) hours as needed for pain. 03/26/14  Yes [name], MD  pantoprazole (PROTONIX) 40 MG tablet Take 1 tablet (40 mg total) by mouth daily. 01/07/14  Yes [name], MD  PHARMACIST CHOICE LANCETS [place]  02/24/14  Yes Historical Provider, MD  simvastatin (ZOCOR) 20 MG tablet Take 1 tablet by mouth at   bedtime 02/19/14  Yes [name], MD  sulfamethoxazole-trimethoprim (BACTRIM DS) 800-160 MG per tablet Take 1 tablet by mouth 2 (two)  times daily. 02/10/14  Yes [name], MD  tamsulosin (FLOMAX) 0.4 MG CAPS capsule TAKE 1 CAPSULE EVERY DAY 05/28/14  Yes [name], MD  tiZANidine (ZANAFLEX) 2 MG tablet Take 1 tablet by mouth  twice a day as needed 03/25/14  Yes [name], MD  cephALEXin (KEFLEX) 500 MG capsule Take 1 capsule (500 mg total) by mouth 4 (four) times daily. 06/20/14   Johnna Acosta, MD  HYDROcodone-acetaminophen (NORCO/VICODIN) 5-325 MG per tablet Take 2 tablets by mouth every 4 (four) hours as needed. 06/20/14   Johnna Acosta, MD  ondansetron (ZOFRAN) 4 MG tablet Take 1 tablet (4 mg total) by mouth every 8 (eight) hours as needed for nausea or vomiting. 06/20/14   Johnna Acosta, MD   BP 135/93  Pulse 89  Temp(Src) 98.2 F (36.8 C) (Oral)  Resp 16  Ht 6' (1.829 m)  Wt 265 lb (120.203 kg)  BMI 35.93 kg/m2  SpO2 95% Physical Exam  Nursing note and vitals reviewed. Constitutional: He appears well-developed and well-nourished. No distress.  HENT:  Head: Normocephalic and atraumatic.  Mouth/Throat: Oropharynx is clear and moist. No oropharyngeal exudate.  Eyes: Conjunctivae and EOM are normal. Pupils are equal, round, and reactive to light. Right eye exhibits no discharge. Left eye exhibits no discharge. No scleral icterus.  Neck: Normal range of motion. Neck supple. No JVD  present. No thyromegaly present.  Cardiovascular: Normal rate, regular rhythm, normal heart sounds and intact distal pulses.  Exam reveals no gallop and no friction rub.   No murmur heard. Pulmonary/Chest: Effort normal and breath sounds normal. No respiratory distress. He has no wheezes. He has no rales.  Abdominal: Soft. Bowel sounds are normal. He exhibits no distension and no mass. There is tenderness ( Tenderness with percussion of his right flank and side, no tenderness of the right upper quadrant or right lower quadrant).  Genitourinary:  Normal appearing penis and testicles, adult diaper with strong -smelling urine  Musculoskeletal: Normal range of motion. He exhibits no edema and no tenderness.  Lymphadenopathy:    He has no cervical adenopathy.  Neurological: He is alert. Coordination normal.  Some contracture of the right upper extremity, able to assist in movement, some involuntary muscle contraction of the right upper extremity, and disconjugate gaze, according to family member has been present since birth  Skin: Skin is warm and dry. No rash noted. No erythema.  Psychiatric: He has a normal mood and affect. His behavior is normal.    ED Course  Procedures (including critical care time) Labs Review Labs Reviewed  CBC WITH DIFFERENTIAL - Abnormal; Notable for the following:    WBC 14.2 (*)    RBC 6.04 (*)    MCV 74.7 (*)    MCH 24.8 (*)    RDW 16.7 (*)    Neutrophils Relative % 79 (*)    Lymphocytes Relative 9 (*)    Neutro Abs 11.2 (*)    Monocytes Absolute 1.1 (*)    All other components within normal limits  COMPREHENSIVE METABOLIC PANEL - Abnormal; Notable for the following:    Glucose, Bld 153 (*)    BUN 28 (*)    Creatinine, Ser 1.39 (*)    Calcium 10.8 (*)    Total Bilirubin 0.2 (*)    GFR calc non Af Amer 51 (*)    GFR calc Af Amer 59 (*)    All other components within normal limits  URINALYSIS, ROUTINE W REFLEX MICROSCOPIC -  Abnormal; Notable for the  following:    Hgb urine dipstick TRACE (*)    Protein, ur 100 (*)    Leukocytes, UA SMALL (*)    All other components within normal limits  URINE MICROSCOPIC-ADD ON - Abnormal; Notable for the following:    Squamous Epithelial / LPF FEW (*)    Bacteria, UA MANY (*)    All other components within normal limits  URINE CULTURE  LIPASE, BLOOD    Imaging Review Ct Abdomen Pelvis Wo Contrast  06/20/2014   CLINICAL DATA:  Right-sided flank and abdominal pain. Lymphoma. Parotid gland carcinoma  EXAM: CT ABDOMEN AND PELVIS WITHOUT CONTRAST  TECHNIQUE: Multidetector CT imaging of the abdomen and pelvis was performed following the standard protocol without IV contrast.  COMPARISON:  07/24/2013  FINDINGS: No evidence of renal calculi or hydronephrosis. No evidence of ureteral calculi or dilatation. No bladder calculi identified. Bilateral renal cysts again noted.  Surgical clips from prior cholecystectomy noted. Noncontrast images of the liver, pancreas, spleen, adrenal glands are unremarkable. No soft tissue masses or lymphadenopathy identified.  Diverticulosis again seen predominately involving the left colon. No evidence of diverticulitis. No other inflammatory process or abnormal fluid collections identified. Mixed lytic and sclerotic bone lesion involving the L3 vertebral body is stable in appearance. No other suspicious bone lesions identified. Old rib fracture deformities again noted.  IMPRESSION: No evidence of urolithiasis of, hydronephrosis, or other acute findings.  Diverticulosis. No radiographic evidence of diverticulitis.  Stable mixed lytic and sclerotic bone lesion involving the L3 vertebral body.   Electronically Signed   By: Earle Gell M.D.   On: 06/20/2014 02:38     EKG Interpretation None      MDM   Final diagnoses:  UTI (lower urinary tract infection)  Leukocytosis  Renal insufficiency  Proteinuria    The patient has abdominal pain which has increased, he does endorse having  a chronic abdominal pain but states this feels slightly different. He is not overtly tender to palpation in the upper abdomen but does have some flank tenderness. Will perform CT scan of the abdomen without contrast to evaluate for kidney stone. Possibly related to chronic pain, less likely appendicitis given no anterior or right lower quadrant abdominal tenderness. He does have some nausea and is obese though his abdomen is not distended or tympanitic I would also consider but less likely a small bowel obstruction. He has had prior surgery including gastrostomy tubes after surgery as well as a prior cholecystectomy according to the family members.  Is in CT scan reviewed, no signs of intra-abdominal or renal abnormalities, urinalysis confirms urinary tract infection. Pulse 89 on my exam, has been less than 100 since arrival. Patient informed of all of his results, leukocytosis the only concerning finding but in this area with hemodynamic stability the patient appears stable after having been given antibiotics, IV fluids, antiemetics and pain medications. He states that he feels better, he understands the indications for return and expresses his understanding. Family members at the bedside agreeable as well.   Meds given in ED:  Medications  ondansetron (ZOFRAN) injection 4 mg (4 mg Intravenous Given 06/20/14 0144)  0.9 %  sodium chloride infusion ( Intravenous New Bag/Given 06/20/14 0144)  cefTRIAXone (ROCEPHIN) 1 g in dextrose 5 % 50 mL IVPB (0 g Intravenous Stopped 06/20/14 0414)  morphine 4 MG/ML injection 4 mg (4 mg Intravenous Given 06/20/14 0312)    New Prescriptions   CEPHALEXIN (KEFLEX) 500 MG CAPSULE  Take 1 capsule (500 mg total) by mouth 4 (four) times daily.   HYDROCODONE-ACETAMINOPHEN (NORCO/VICODIN) 5-325 MG PER TABLET    Take 2 tablets by mouth every 4 (four) hours as needed.   ONDANSETRON (ZOFRAN) 4 MG TABLET    Take 1 tablet (4 mg total) by mouth every 8 (eight) hours as needed for  nausea or vomiting.      Johnna Acosta, MD 06/20/14 [phone]

## 2014-06-20 NOTE — ED Notes (Signed)
Patient states that he needed to vomit, vomit bag given. Patient spitting up a lot of fleam like substance.

## 2014-06-20 NOTE — ED Notes (Signed)
Gave patient water to drink for fluid challenge as verbally ordered by MD. Patient drinking water with no difficulty at this time.

## 2014-06-24 ENCOUNTER — Encounter: Payer: Self-pay | Admitting: Family Medicine

## 2014-06-24 ENCOUNTER — Ambulatory Visit (INDEPENDENT_AMBULATORY_CARE_PROVIDER_SITE_OTHER): Payer: Commercial Managed Care - HMO | Admitting: Family Medicine

## 2014-06-24 VITALS — BP 132/78 | HR 68 | Temp 98.2°F | Resp 16

## 2014-06-24 DIAGNOSIS — R109 Unspecified abdominal pain: Secondary | ICD-10-CM

## 2014-06-24 DIAGNOSIS — N183 Chronic kidney disease, stage 3 unspecified: Secondary | ICD-10-CM

## 2014-06-24 DIAGNOSIS — N390 Urinary tract infection, site not specified: Secondary | ICD-10-CM

## 2014-06-24 DIAGNOSIS — Z87898 Personal history of other specified conditions: Secondary | ICD-10-CM

## 2014-06-24 DIAGNOSIS — I10 Essential (primary) hypertension: Secondary | ICD-10-CM

## 2014-06-24 DIAGNOSIS — E119 Type 2 diabetes mellitus without complications: Secondary | ICD-10-CM

## 2014-06-24 DIAGNOSIS — G8929 Other chronic pain: Secondary | ICD-10-CM

## 2014-06-24 LAB — URINE CULTURE

## 2014-06-24 MED ORDER — OXYCODONE-ACETAMINOPHEN 7.5-325 MG PO TABS: 1.0000 | ORAL_TABLET | ORAL | Status: DC | PRN

## 2014-06-24 NOTE — Patient Instructions (Signed)
Take Levemir instead of lantus for now Continue humaolog Complete antibiotics Elevate the feet If swelling not improved- I will send in water pill Increase water intake  F/U 4 months

## 2014-06-24 NOTE — Progress Notes (Signed)
Patient ID: [name], male   DOB: [birth year], 68 y.o.   MRN: [id number]   Subjective:    Patient ID: [name], male    DOB: 01/01/1946, 68 y.o.   MRN: [id number]  Patient presents for 6 month F/U and BLE Feet edema/ pain        Patient here to follow chronic medical problems. He has history of diabetes mellitus chronic pain right upper quadrant status post stroke with hemiparesis Will chair bound. He was seen in emergency room over the weekend secondary to fever chills nausea vomiting diagnosed with urinary tract infection his culture showed Escherichia coli greater than 100,000 colonies. He's currently on Keflex and is much improved. He does continue to complain of right upper quadrant pain is chronic. Diabetes mellitus he's still being followed by endocrinology he had labs in about 2 weeks ago which I need to obtain. Unfortunately he is in the donut hole and none of his medications are being covered right now. His wife is no evidence of the last about 2 weeks. She's able to get most of his oral medications with exception of the church and to as this caused about $300 a month.    Review Of Systems:  GEN- denies fatigue, fever, weight loss,weakness, recent illness HEENT- denies eye drainage, change in vision, nasal discharge, CVS- denies chest pain, palpitations RESP- denies SOB, cough, wheeze ABD- denies N/V, change in stools, abd pain GU- denies dysuria, hematuria, dribbling, incontinence MSK-+ joint pain, +muscle aches, injury Neuro- denies headache, dizziness, syncope, seizure activity       Objective:    BP 132/78  Pulse 68  Temp(Src) 98.2 F (36.8 C) (Oral)  Resp 16 GEN- NAD, alert and oriented x3, sitting in wheelchair/wheelchair bound, obese HEENT- EOMI, non injected sclera, pink conjunctiva, MMM, oropharynx clear, CVS- RRR, no murmur RESP-CTAB ABD-NABS,soft,NT, TTP RUQ, no rebound,  EXT-+ edema R , trace LLE edema MSK- Decreased ROM Upper and Lower ext,  hemiplegia Right arm, Left shoulder/arm Decreased ROM, decreased tone Pulses- Radial, 2+       Assessment & Plan:      Problem List Items Addressed This Visit   None      Note: This dictation was prepared with Dragon dictation along with smaller phrase technology. Any transcriptional errors that result from this process are unintentional.

## 2014-06-24 NOTE — Assessment & Plan Note (Signed)
His white blood cell count has been stable. He also follows yearly regarding the parotid adenocarcinoma

## 2014-06-24 NOTE — Assessment & Plan Note (Signed)
Will continue Keflex based on sensitivities. Systemically he is improving

## 2014-06-24 NOTE — Assessment & Plan Note (Signed)
His blood pressure is well-controlled continue current medication.

## 2014-06-24 NOTE — Assessment & Plan Note (Signed)
Diabetes is controlled by endocrinology. Since he is in the donut hole or go ahead and give him Levemir which she can use 40 units of this. Also given him a sample of Humalog. If samples of Tradjenta

## 2014-06-24 NOTE — Assessment & Plan Note (Signed)
Kidney function is stable review labs from ER

## 2014-06-24 NOTE — Assessment & Plan Note (Signed)
Chronic abdominal pain. His CT scan did not show anything acute. I refilled his pain medication this is all we found that helps his pain.

## 2014-06-26 ENCOUNTER — Telehealth (HOSPITAL_BASED_OUTPATIENT_CLINIC_OR_DEPARTMENT_OTHER): Payer: Self-pay | Admitting: Emergency Medicine

## 2014-06-26 NOTE — Telephone Encounter (Signed)
Post ED Visit - Positive Culture Follow-up  Culture report reviewed by antimicrobial stewardship pharmacist: []  Wes Dulaney, Pharm.D., BCPS []  Heide Guile, Pharm.D., BCPS []  [name], Pharm.D., BCPS [x]  [place], Pharm.D., BCPS, AAHIVP []  Legrand Como, Pharm.D., BCPS, AAHIVP  Positive urine culture Treated with Keflex, organism sensitive to the same and no further patient follow-up is required at this time.  [place], Rex Kras 06/26/2014, 5:30 PM

## 2014-08-06 ENCOUNTER — Other Ambulatory Visit: Payer: Self-pay | Admitting: Family Medicine

## 2014-08-07 NOTE — Telephone Encounter (Signed)
Refill appropriate and filled per protocol. 

## 2014-08-25 ENCOUNTER — Telehealth: Payer: Self-pay | Admitting: Family Medicine

## 2014-08-25 NOTE — Telephone Encounter (Signed)
Patient would like referral left message but that is all that was said please call patient back at [phone]

## 2014-08-27 NOTE — Telephone Encounter (Signed)
Called Dr.Nida office and they stated that pt is already EP there and do not need referral from provider but from [facility]. I submitted referral thru acuity connect for authorization to Dr. Dorris Fetch, authorization number is [id number], Dr. Dorris Fetch office is needing hard copy of authorization once receive will fax to their office.

## 2014-08-27 NOTE — Telephone Encounter (Signed)
FYI: Contacted pt and wife stated that she would like a referral again to see Dr. Dorris Fetch for care of diabetes. I have put in a referral to endocrinologist for NIDA.

## 2014-08-27 NOTE — Telephone Encounter (Signed)
Okay to do referral, he was already being followed by Dr. Dorris Fetch, so this may be an insurance issue

## 2014-09-05 ENCOUNTER — Ambulatory Visit (INDEPENDENT_AMBULATORY_CARE_PROVIDER_SITE_OTHER): Payer: Commercial Managed Care - HMO | Admitting: Family Medicine

## 2014-09-05 VITALS — BP 128/64 | HR 72 | Temp 97.9°F | Resp 14

## 2014-09-05 DIAGNOSIS — I10 Essential (primary) hypertension: Secondary | ICD-10-CM

## 2014-09-05 DIAGNOSIS — Z23 Encounter for immunization: Secondary | ICD-10-CM

## 2014-09-05 DIAGNOSIS — R609 Edema, unspecified: Secondary | ICD-10-CM

## 2014-09-05 DIAGNOSIS — R600 Localized edema: Secondary | ICD-10-CM

## 2014-09-05 DIAGNOSIS — B372 Candidiasis of skin and nail: Secondary | ICD-10-CM | POA: Insufficient documentation

## 2014-09-05 DIAGNOSIS — E119 Type 2 diabetes mellitus without complications: Secondary | ICD-10-CM

## 2014-09-05 MED ORDER — FUROSEMIDE 40 MG PO TABS: 40.0000 mg | ORAL_TABLET | Freq: Every day | ORAL | Status: DC

## 2014-09-05 MED ORDER — OXYCODONE-ACETAMINOPHEN 7.5-325 MG PO TABS: 1.0000 | ORAL_TABLET | ORAL | Status: DC | PRN

## 2014-09-05 NOTE — Assessment & Plan Note (Signed)
Blood sugars being monitored by endocrinology. I did give him 2 samples of Humalog insulin today

## 2014-09-05 NOTE — Assessment & Plan Note (Signed)
Nystatin powder to groin and axilla

## 2014-09-05 NOTE — Assessment & Plan Note (Addendum)
Hold HCTZ, chane to Lasix 34m due to dependent edema from his paralysis and sitting in a chair with his legs down all day long

## 2014-09-05 NOTE — Patient Instructions (Signed)
Stop the HCTZ Start lasix 21m in the morning  Nystatin powder Flu shot  We will call in 1 week  F/U 3 Months

## 2014-09-05 NOTE — Progress Notes (Signed)
Patient ID: [name], male   DOB: Apr 27, 1946, 68 y.o.   MRN: [id number]   Subjective:    Patient ID: [name], male    DOB: 03-03-1946, 68 y.o.   MRN: [id number]  Patient presents for Retaining Fluid in [place], [place] and Flu [facility]  patient here retaining fluid worse over the past couple weeks. She is dependent in a wheelchair secondary to his remote stroke. His legs very much pain all day long. He is on hydrochlorothiazide for his blood pressure at baseline. He's not had any significant chest pain or shortness of breath. He also complains of a rash in his groin bilaterally there is a lot of moisture date his wife because of him to sitting it is difficult to keep him from sweating and to the area. He was seen by his endocrinologist also really for his diabetes mellitus and his medications were adjusted they're planning to take him off of the Tradjenta as there is no signficant change in his A1C and due to cost. They are still in the donut hole therefore having difficulty getting his insulin at times but will be in a program at the health department to get his Lantus  Request refill on pain meds    Review Of Systems:  GEN- denies fatigue, fever, weight loss,weakness, recent illness HEENT- denies eye drainage, change in vision, nasal discharge, CVS- denies chest pain, palpitations RESP- denies SOB, cough, wheeze ABD- denies N/V, change in stools, abd pain GU- denies dysuria, hematuria, dribbling, incontinence MSK- +joint pain, muscle aches, injury Neuro- denies headache, dizziness, syncope, seizure activity       Objective:    BP 128/64  Pulse 72  Temp(Src) 97.9 F (36.6 C) (Oral)  Resp 14 GEN- NAD, alert and oriented x3, sitting in wheelchair/wheelchair bound, obese HEENT- EOMI, non injected sclera, pink conjunctiva, MMM, oropharynx clear, CVS- RRR, no murmur RESP-CTAB EXT-1+ pittng edema bilat  Skin- erythema with maceration bilat groin, no boils or abscess seen, significant  moisture noted Pulses- Radial, 2+        Assessment & Plan:      Problem List Items Addressed This Visit   Leg edema     Hold HCTZ, chane to Lasix 28m due to dependent edema from his paralysis and sitting in a chair with his legs down all day long    HYPERTENSION   Relevant Medications      furosemide (LASIX) tablet   DM     Blood sugars being monitored by endocrinology. I did give him 2 samples of Humalog insulin today    Candidiasis, intertrigo     Nystatin powder to groin and axilla     Other Visit Diagnoses   Need for prophylactic vaccination and inoculation against influenza    -  Primary    Relevant Orders       Flu Vaccine QUAD 36+ mos PF IM (Fluarix Quad PF) (Completed)       Note: This dictation was prepared with Dragon dictation along with smaller phrase technology. Any transcriptional errors that result from this process are unintentional.

## 2014-09-08 ENCOUNTER — Other Ambulatory Visit: Payer: Self-pay | Admitting: *Deleted

## 2014-09-08 ENCOUNTER — Telehealth: Payer: Self-pay | Admitting: *Deleted

## 2014-09-08 DIAGNOSIS — E119 Type 2 diabetes mellitus without complications: Secondary | ICD-10-CM

## 2014-09-08 NOTE — Telephone Encounter (Signed)
Submitted [employer] referral thru [facility] for authorization to [facility] with authorization number [id number], once receive paper form hard copy will fax to [facility].

## 2014-09-09 ENCOUNTER — Encounter: Payer: Self-pay | Admitting: *Deleted

## 2014-09-09 LAB — BLOOD PRESSURE CHECK BP00: A1c: 7.2

## 2014-09-09 NOTE — Telephone Encounter (Signed)
Received fax from [facility] with authorization number and it was faxed to [facility]

## 2014-09-17 ENCOUNTER — Telehealth: Payer: Self-pay | Admitting: *Deleted

## 2014-09-17 NOTE — Telephone Encounter (Signed)
Message copied by Sheral Flow on Wed Sep 17, 2014  4:33 PM ------      Message from: Vic Blackbird F      Created: Wed Sep 17, 2014  2:12 PM      Regarding: RE: f/u leg swelling       Please turn into phone note, increase lasix to 1 tablet BID for the next week, take the second dose by 5pm            Call us in 1 week to see if improved,      Try to elevate his feet some                  ----- Message -----         From: Eden Lathe Six, LPN         Sent: 60/05/3015   5:10 PM           To: [name], MD      Subject: RE: f/u leg swelling                                     Call placed to patient wife, [name].             Reports that his legs continue to swell, and she does not see much difference in the edema since starting Lasix.             MD please advise.             ----- Message -----         From: [name], MD         Sent: 09/15/2014   3:53 PM           To: Eden Lathe Six, LPN      Subject: FW: f/u leg swelling                                     Convert to phone note            Call pt, told to hold HCTZ, take lasix 65m once a day, how is leg swelling doing.                   ----- Message -----         From: KAlycia Rossetti MD         Sent: 09/10/2014           To: KAlycia Rossetti MD      Subject: f/u leg swelling                                                            ------

## 2014-09-17 NOTE — Telephone Encounter (Signed)
Call placed to patient wife. [place].

## 2014-09-18 NOTE — Telephone Encounter (Signed)
Call placed to patient and patient made aware.  

## 2014-09-19 ENCOUNTER — Ambulatory Visit (INDEPENDENT_AMBULATORY_CARE_PROVIDER_SITE_OTHER): Payer: Commercial Managed Care - HMO | Admitting: Podiatry

## 2014-09-19 VITALS — BP 123/81 | HR 74 | Resp 15 | Ht 73.0 in

## 2014-09-19 DIAGNOSIS — B351 Tinea unguium: Secondary | ICD-10-CM

## 2014-09-19 DIAGNOSIS — E1140 Type 2 diabetes mellitus with diabetic neuropathy, unspecified: Secondary | ICD-10-CM

## 2014-09-19 DIAGNOSIS — E1149 Type 2 diabetes mellitus with other diabetic neurological complication: Secondary | ICD-10-CM

## 2014-09-19 NOTE — Progress Notes (Signed)
   Subjective:    Patient ID: [name], male    DOB: February 11, 1946, 68 y.o.   MRN: [id number]  HPI Comments: [name], 68 year old male, presents the office they for diabetic risk assessment and for symptomatic elongated nails. Patient states that he is diabetic and his blood sugar is typically "extremely high". Patient is nonambulatory. Denies any history of ulceration. No other complaints at this time.  Diabetes Hypoglycemia symptoms include headaches.      Review of Systems  Neurological: Positive for headaches.  All other systems reviewed and are negative.      Objective:   Physical Exam AAO x3, NAD DP/PT pulses decreased bilaterally, CRT less than 3 seconds. Decreased protective sensation with Derrel Nip monofilament, vibratory sensation, Achilles tendon reflex. Bilateral lower extremity edema (patient recently seen by his PCP for this). Nails hypertrophic, dystrophic, elongated, brittle, yellow-brown discoloration x10.  No surrounding erythema or drainage from the nails. No open lesions or pre-ulcerative lesions. No calf pain, warmth, erythema.       Assessment & Plan:  68 year old male presents for diabetic risk assessment, neuropathy, onychomycosis  -Treatment options discussed including alternatives, risks, complications. -Nail sharply debrided W85 without complications. -Importance daily foot inspection discussed the patient's wife. -Followup in 3 months or sooner if any problems are to arise or any change in symptoms. In the meantime call the office with any questions, concerns. Followup with PCP for other issues mentioned and review of systems as is her ongoing in no acute changes.

## 2014-09-21 ENCOUNTER — Encounter: Payer: Self-pay | Admitting: Podiatry

## 2014-09-29 ENCOUNTER — Other Ambulatory Visit: Payer: Self-pay | Admitting: Family Medicine

## 2014-09-29 NOTE — Telephone Encounter (Signed)
Medication refilled per protocol. 

## 2014-10-17 ENCOUNTER — Other Ambulatory Visit: Payer: Self-pay | Admitting: Family Medicine

## 2014-10-20 NOTE — Telephone Encounter (Signed)
Refill appropriate and filled per protocol. 

## 2014-10-28 ENCOUNTER — Ambulatory Visit: Payer: Commercial Managed Care - HMO | Admitting: Family Medicine

## 2014-11-25 ENCOUNTER — Other Ambulatory Visit: Payer: Self-pay | Admitting: Family Medicine

## 2014-11-26 NOTE — Telephone Encounter (Signed)
Refill appropriate and filled per protocol. 

## 2014-12-03 ENCOUNTER — Ambulatory Visit (INDEPENDENT_AMBULATORY_CARE_PROVIDER_SITE_OTHER): Payer: Commercial Managed Care - HMO | Admitting: Family Medicine

## 2014-12-03 ENCOUNTER — Encounter: Payer: Self-pay | Admitting: Family Medicine

## 2014-12-03 VITALS — BP 136/80 | HR 78 | Temp 98.7°F | Resp 18

## 2014-12-03 DIAGNOSIS — N183 Chronic kidney disease, stage 3 unspecified: Secondary | ICD-10-CM

## 2014-12-03 DIAGNOSIS — E785 Hyperlipidemia, unspecified: Secondary | ICD-10-CM

## 2014-12-03 DIAGNOSIS — E669 Obesity, unspecified: Secondary | ICD-10-CM

## 2014-12-03 DIAGNOSIS — E119 Type 2 diabetes mellitus without complications: Secondary | ICD-10-CM

## 2014-12-03 DIAGNOSIS — I10 Essential (primary) hypertension: Secondary | ICD-10-CM

## 2014-12-03 MED ORDER — OXYCODONE-ACETAMINOPHEN 7.5-325 MG PO TABS: 1.0000 | ORAL_TABLET | ORAL | Status: DC | PRN

## 2014-12-03 NOTE — Assessment & Plan Note (Signed)
Due for lipid if not done with Endocrine labs

## 2014-12-03 NOTE — Assessment & Plan Note (Signed)
Well controlled, no change to meds 

## 2014-12-03 NOTE — Progress Notes (Signed)
Patient ID: [name], male   DOB: Mar 20, 1946, 68 y.o.   MRN: [id number]   Subjective:    Patient ID: [name], male    DOB: 1946/01/02, 68 y.o.   MRN: [id number]  Patient presents for 3 month F/U  Patient here to follow-up on medical problems. His wife is no specific concerns today. He is at his baseline currently. They do request a refill on pain medication as well as samples of his Humalog insulin. It appears that he is being taken off of his Tradjenta this will be replaced with something different since his renal function has improved but the wife does not know what. He also had fasting labs done a week ago which was reviewed by endocrinology I do not have copies of these. His medications were reviewed in detail. He has his flu shot.   Review Of Systems:  GEN- denies fatigue, fever, weight loss,weakness, recent illness HEENT- denies eye drainage, change in vision, nasal discharge, CVS- denies chest pain, palpitations RESP- denies SOB, cough, wheeze ABD- denies N/V, change in stools, chronic abd pain GU- denies dysuria, hematuria, dribbling, incontinence MSK- + joint pain, muscle aches, injury Neuro- denies headache, dizziness, syncope, seizure activity       Objective:    BP 136/80 mmHg  Pulse 78  Temp(Src) 98.7 F (37.1 C) (Oral)  Resp 18 GEN- NAD, alert and oriented x3, sitting in wheelchair, obese HEENT- EOMI, non injected sclera, Left eye reactive, pink conjunctiva, MMM, oropharynx clear, CVS- RRR, no murmur RESP-CTAB EXT-1+ pittng edema bilat  Skin- dry skin  no boils or abscess seen,  Pulses- Radial, 2+       Assessment & Plan:      Problem List Items Addressed This Visit    None      Note: This dictation was prepared with Dragon dictation along with smaller phrase technology. Any transcriptional errors that result from this process are unintentional.

## 2014-12-03 NOTE — Assessment & Plan Note (Signed)
Followed by endocrine, 2 humalog pens given today - samples Obtain records from endocrine

## 2014-12-03 NOTE — Patient Instructions (Signed)
Continue current medications Pain medicine refilled I will call with cholesterol needs to be drawn F/U 4 months

## 2014-12-04 ENCOUNTER — Other Ambulatory Visit: Payer: Self-pay | Admitting: Family Medicine

## 2014-12-07 NOTE — Telephone Encounter (Signed)
Refill appropriate and filled per protocol. 

## 2014-12-25 ENCOUNTER — Telehealth: Payer: Self-pay | Admitting: Family Medicine

## 2014-12-25 MED ORDER — PANTOPRAZOLE SODIUM 40 MG PO TBEC: 40.0000 mg | DELAYED_RELEASE_TABLET | Freq: Every day | ORAL | Status: DC

## 2014-12-25 MED ORDER — FUROSEMIDE 40 MG PO TABS: 40.0000 mg | ORAL_TABLET | Freq: Every day | ORAL | Status: DC

## 2014-12-25 MED ORDER — METOPROLOL SUCCINATE ER 50 MG PO TB24
50.0000 mg | ORAL_TABLET | Freq: Every day | ORAL | Status: DC
Start: 2014-12-25 — End: 2015-06-23

## 2014-12-25 MED ORDER — METOPROLOL SUCCINATE ER 50 MG PO TB24: 50.0000 mg | ORAL_TABLET | Freq: Every day | ORAL | Status: DC

## 2014-12-25 NOTE — Telephone Encounter (Signed)
[facility] is needing a refill on furosemide (LASIX) 40 MG tablet metoprolol succinate (TOPROL-XL) 50 MG 24 hr tablet pantoprazole (PROTONIX) 40 MG tablet

## 2014-12-25 NOTE — Telephone Encounter (Signed)
Medication refilled per protocol. 

## 2014-12-26 ENCOUNTER — Encounter: Payer: Self-pay | Admitting: Podiatry

## 2014-12-26 ENCOUNTER — Ambulatory Visit (INDEPENDENT_AMBULATORY_CARE_PROVIDER_SITE_OTHER): Payer: Medicare Other | Admitting: Podiatry

## 2014-12-26 DIAGNOSIS — B351 Tinea unguium: Secondary | ICD-10-CM

## 2014-12-26 DIAGNOSIS — E1149 Type 2 diabetes mellitus with other diabetic neurological complication: Secondary | ICD-10-CM

## 2014-12-26 DIAGNOSIS — T148XXA Other injury of unspecified body region, initial encounter: Secondary | ICD-10-CM

## 2014-12-26 DIAGNOSIS — T148 Other injury of unspecified body region: Secondary | ICD-10-CM

## 2014-12-26 DIAGNOSIS — E1140 Type 2 diabetes mellitus with diabetic neuropathy, unspecified: Secondary | ICD-10-CM

## 2014-12-26 NOTE — Patient Instructions (Signed)
Monitor for any signs/symptoms of infection. Call the office immediately if any occur or go directly to the emergency room. Call with any questions/concerns. Apply antibiotic ointment and a band-aid over the right 2nd toe abrasion. If the area has not healed in 2 weeks, call the office. Call immediately if there is any changes.   Diabetes and Foot Care Diabetes may cause you to have problems because of poor blood supply (circulation) to your feet and legs. This may cause the skin on your feet to become thinner, break easier, and heal more slowly. Your skin may become dry, and the skin may peel and crack. You may also have nerve damage in your legs and feet causing decreased feeling in them. You may not notice minor injuries to your feet that could lead to infections or more serious problems. Taking care of your feet is one of the most important things you can do for yourself.  HOME CARE INSTRUCTIONS  Wear shoes at all times, even in the house. Do not go barefoot. Bare feet are easily injured.  Check your feet daily for blisters, cuts, and redness. If you cannot see the bottom of your feet, use a mirror or ask someone for help.  Wash your feet with warm water (do not use hot water) and mild soap. Then pat your feet and the areas between your toes until they are completely dry. Do not soak your feet as this can dry your skin.  Apply a moisturizing lotion or petroleum jelly (that does not contain alcohol and is unscented) to the skin on your feet and to dry, brittle toenails. Do not apply lotion between your toes.  Trim your toenails straight across. Do not dig under them or around the cuticle. File the edges of your nails with an emery board or nail file.  Do not cut corns or calluses or try to remove them with medicine.  Wear clean socks or stockings every day. Make sure they are not too tight. Do not wear knee-high stockings since they may decrease blood flow to your legs.  Wear shoes that fit  properly and have enough cushioning. To break in new shoes, wear them for just a few hours a day. This prevents you from injuring your feet. Always look in your shoes before you put them on to be sure there are no objects inside.  Do not cross your legs. This may decrease the blood flow to your feet.  If you find a minor scrape, cut, or break in the skin on your feet, keep it and the skin around it clean and dry. These areas may be cleansed with mild soap and water. Do not cleanse the area with peroxide, alcohol, or iodine.  When you remove an adhesive bandage, be sure not to damage the skin around it.  If you have a wound, look at it several times a day to make sure it is healing.  Do not use heating pads or hot water bottles. They may burn your skin. If you have lost feeling in your feet or legs, you may not know it is happening until it is too late.  Make sure your health care provider performs a complete foot exam at least annually or more often if you have foot problems. Report any cuts, sores, or bruises to your health care provider immediately. SEEK MEDICAL CARE IF:   You have an injury that is not healing.  You have cuts or breaks in the skin.  You have an  ingrown nail.  You notice redness on your legs or feet.  You feel burning or tingling in your legs or feet.  You have pain or cramps in your legs and feet.  Your legs or feet are numb.  Your feet always feel cold. SEEK IMMEDIATE MEDICAL CARE IF:   There is increasing redness, swelling, or pain in or around a wound.  There is a red line that goes up your leg.  Pus is coming from a wound.  You develop a fever or as directed by your health care provider.  You notice a bad smell coming from an ulcer or wound. Document Released: 11/25/2000 Document Revised: 07/31/2013 Document Reviewed: 05/07/2013 [facility] Patient Information 2015 [place], [place]. This information is not intended to replace advice given to you by your  health care provider. Make sure you discuss any questions you have with your health care provider.

## 2014-12-28 NOTE — Progress Notes (Signed)
Patient ID: [name], male   DOB: October 12, 1946, 69 y.o.   MRN: [id number]  Subjective: 69 year old male presents the office today for elongated toenails. His family member who was present with him today states that his nails rub in his shoes. No other complaints at this time in no acute changes since last appointment. Denies any systemic complaints such as fevers, chills, nausea, vomiting.  Objective: AAO 3, NAD  DP/PT pulses decreased bilaterally, CRT less than 3 seconds Decreased protective sensation with Simms Weinstein monofilament, decreased vibratory sensation, decreased Achilles tendon reflex. Nails hypertrophic, dystrophic, elongated, brittle, discolored 10. No surrounding erythema or drainage from the nail sites. On the dorsal aspect of the right second digit there does appear to be a superficial abrasion/skin sluff along the dorsal PIPJ. This appears to be an area of rubbing within the shoes and due to friction. There is no surrounding erythema or any other clinical signs of infection at this time.  No other open lesions or pre-ulcerative lesions are identified. Hammertoe contractures are present bilaterally with mild HAV. No pain with calf compression, swelling, warmth, erythema.  Assessment: 69 year old male h/o CVA, diabetes/neuropathy, with symptomatic onychomycosis   Plan: -Treatment options were discussed including alternatives, risks, complications. -Nail sharply debrided 10 without complication/bleeding. -For the right dorsal second superficial abrasion discussed apply a small amount antibody ointment and a bandage daily. Monitoring clinical signs or symptoms of infection. -Discussed daily foot inspection. -Follow-up in 2 weeks if the lesion on the right second toe is not healed or sooner should any palms arise. Otherwise follow-up in 3 months. In the meantime encouraged to call the office with any questions, concerns, change in symptoms.

## 2015-01-12 ENCOUNTER — Telehealth: Payer: Self-pay | Admitting: Family Medicine

## 2015-01-12 MED ORDER — ALLOPURINOL 300 MG PO TABS: 300.0000 mg | ORAL_TABLET | Freq: Every day | ORAL | Status: DC

## 2015-01-12 NOTE — Telephone Encounter (Signed)
Prescription sent to pharmacy.

## 2015-01-12 NOTE — Telephone Encounter (Signed)
Patient is calling to get refill on allopurinol, says that she had to call us to get a new rx  Needs to go to [facility]   i

## 2015-01-22 ENCOUNTER — Telehealth: Payer: Self-pay | Admitting: Family Medicine

## 2015-01-22 MED ORDER — AMLODIPINE BESYLATE 10 MG PO TABS: 10.0000 mg | ORAL_TABLET | Freq: Every day | ORAL | Status: DC

## 2015-01-22 MED ORDER — TIZANIDINE HCL 2 MG PO TABS: 2.0000 mg | ORAL_TABLET | Freq: Two times a day (BID) | ORAL | Status: DC | PRN

## 2015-01-22 NOTE — Telephone Encounter (Signed)
[facility] [place]  Patient is calling to get refills on his amlodipine and tizanidine   [phone] if any questions

## 2015-01-22 NOTE — Telephone Encounter (Signed)
Prescription sent to pharmacy.

## 2015-02-04 IMAGING — CT CT ABD-PELV W/O CM
2 of 4 series · 17 of 46 positions shown, 19 images · non-contrast
Comparison: 07/24/2013

CLINICAL DATA: Right-sided flank and abdominal pain. Lymphoma.
Parotid gland carcinoma

EXAM:
CT ABDOMEN AND PELVIS WITHOUT CONTRAST
TECHNIQUE: Multidetector CT imaging of the abdomen and pelvis was performed
following the standard protocol without IV contrast.

[Series 2: standard/full over (age)lbs 5.0 · axial · 0.95mm/px · z∈[-504,-28]mm · 14 of 105 slices shown, 16 images]
[im 5/105  soft-tissue]
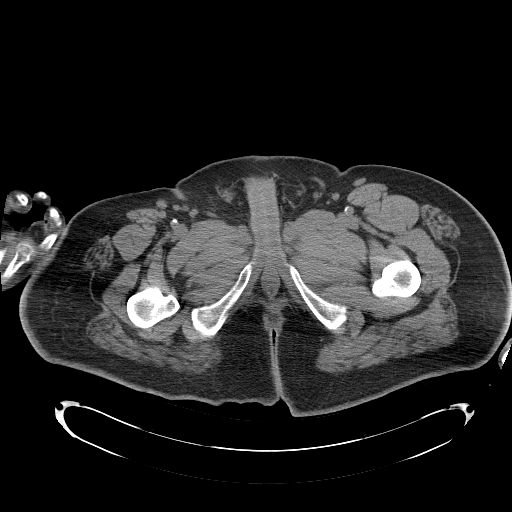
[im 5/105  bone]
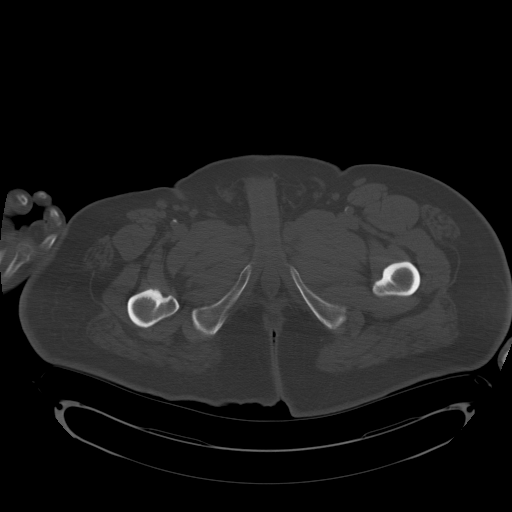
[im 14/105  soft-tissue]
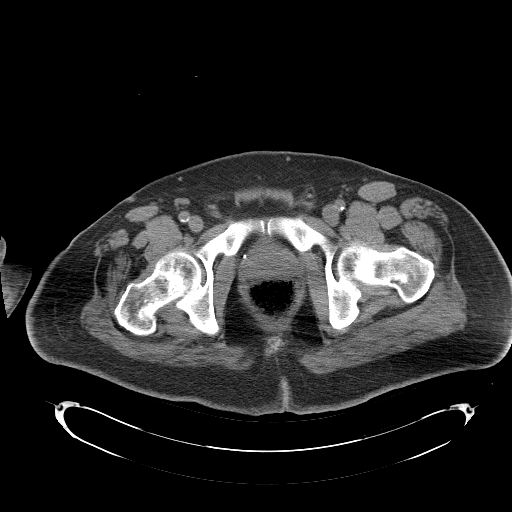
[im 22/105  soft-tissue]
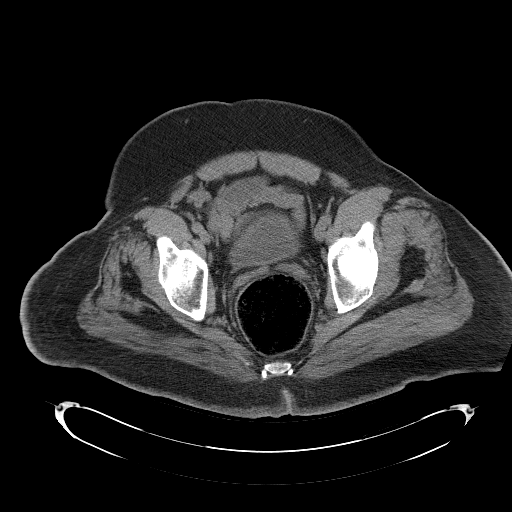
[im 27/105  soft-tissue]
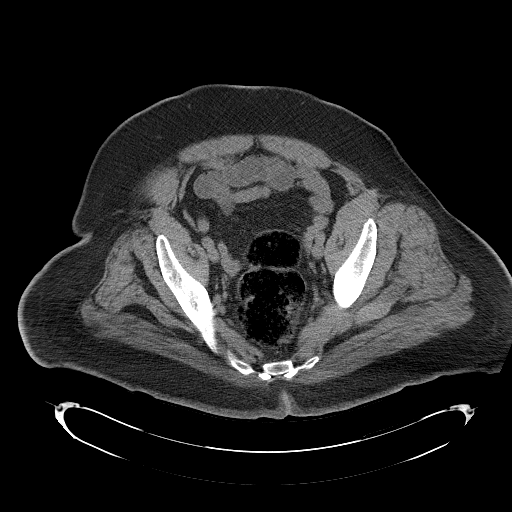
[im 35/105  soft-tissue]
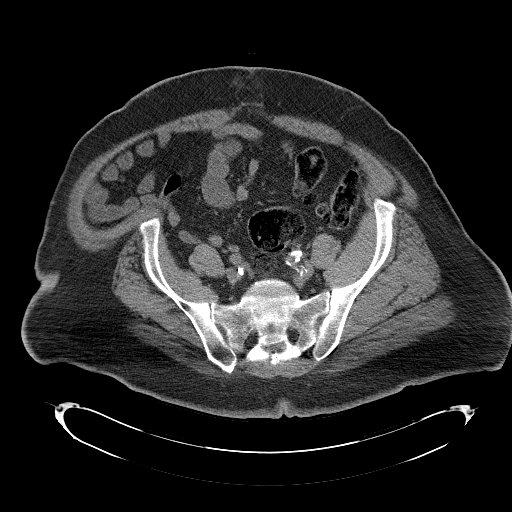
[im 44/105  soft-tissue]
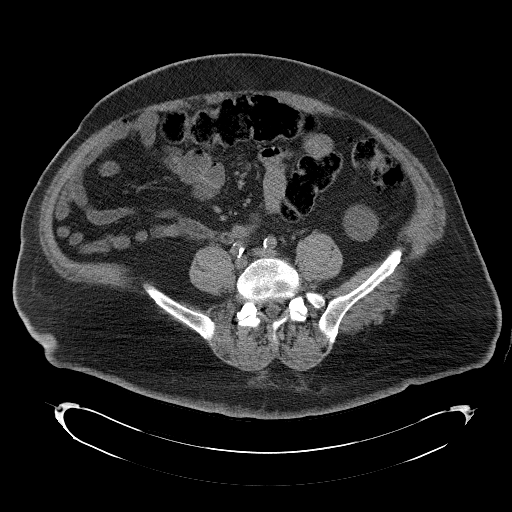
[im 48/105  soft-tissue]
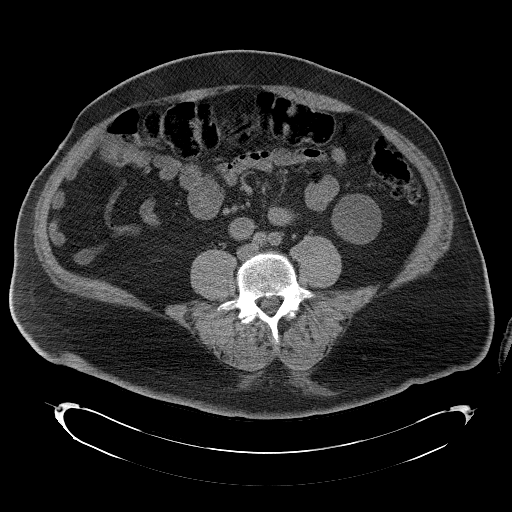
[im 57/105  soft-tissue]
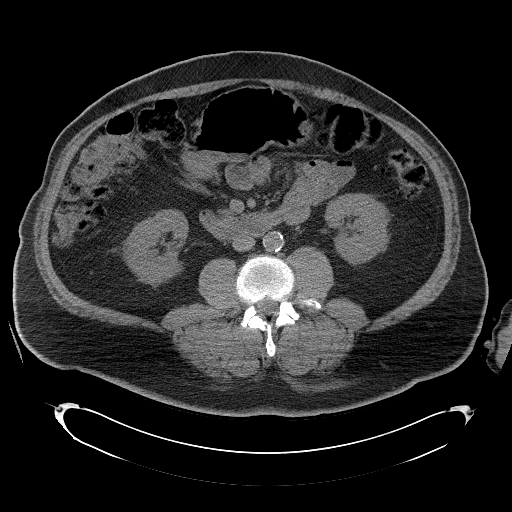
[im 61/105  soft-tissue]
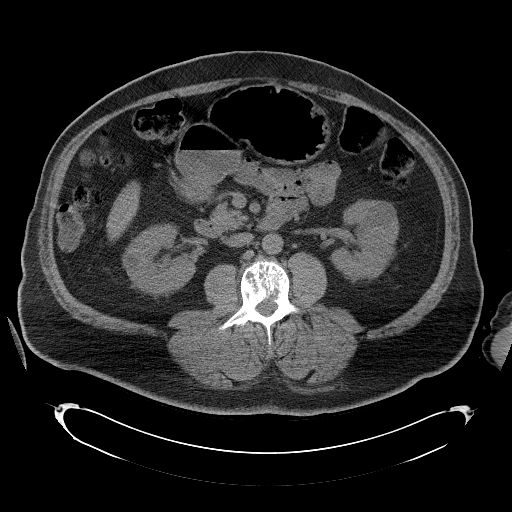
[im 61/105  bone]
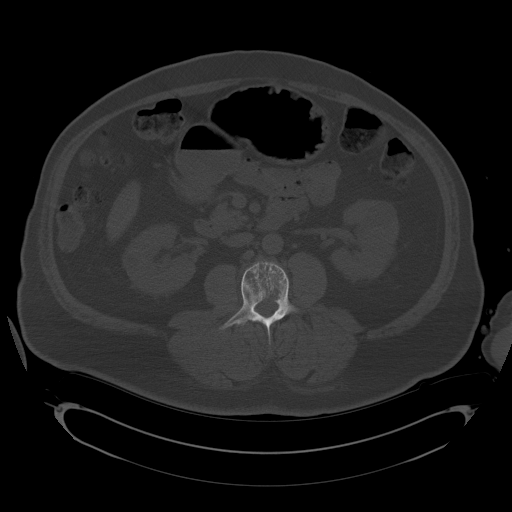
[im 70/105  soft-tissue]
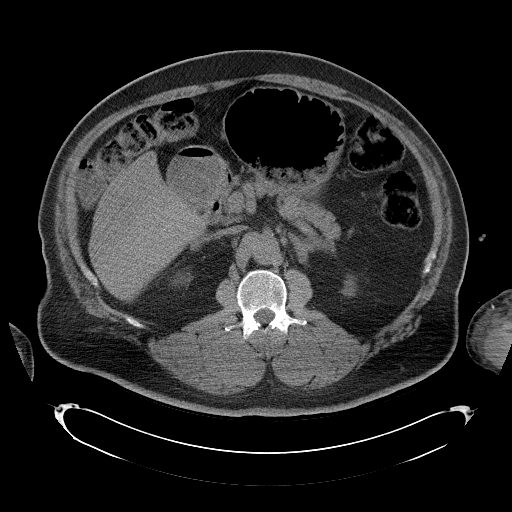
[im 79/105  soft-tissue]
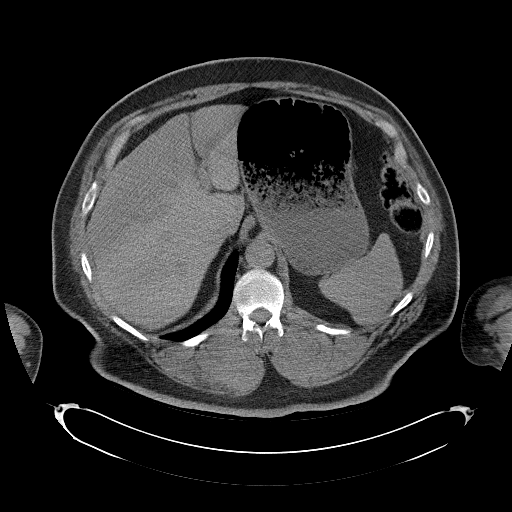
[im 83/105  soft-tissue]
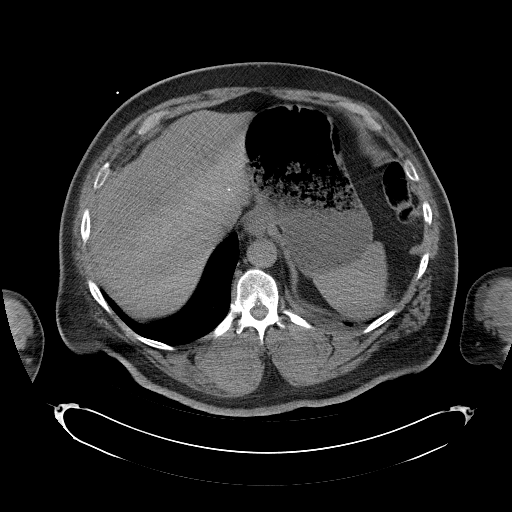
[im 92/105  soft-tissue]
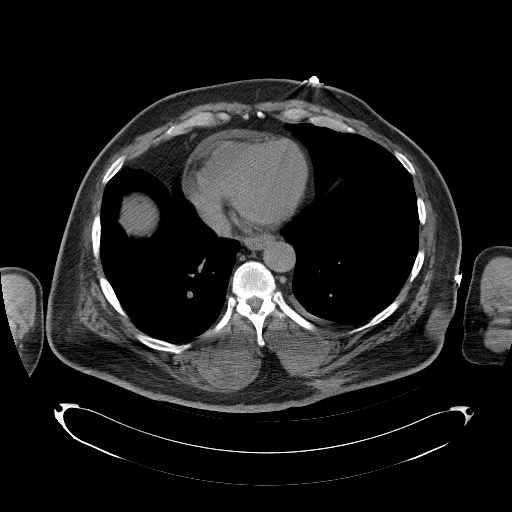
[im 100/105  soft-tissue]
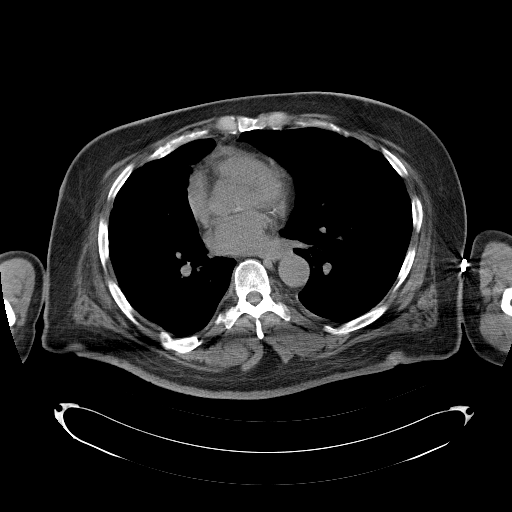

[Series 3: mpr coronal · coronal · 1.03mm/px · 3 of 120 slices shown]
[im 40/120  soft-tissue]
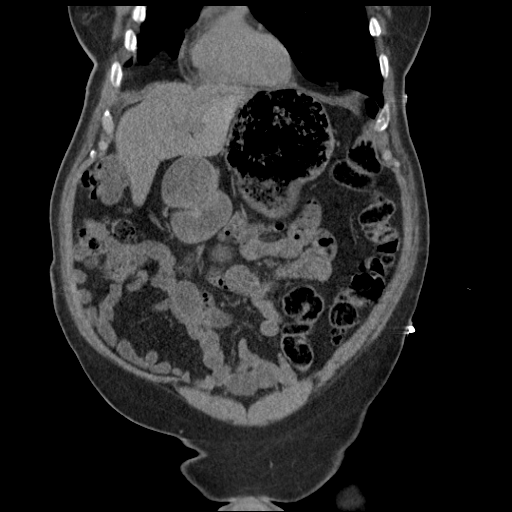
[im 53/120  soft-tissue]
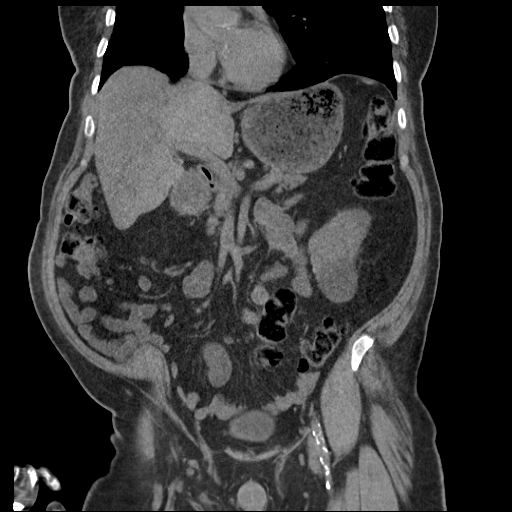
[im 67/120  soft-tissue]
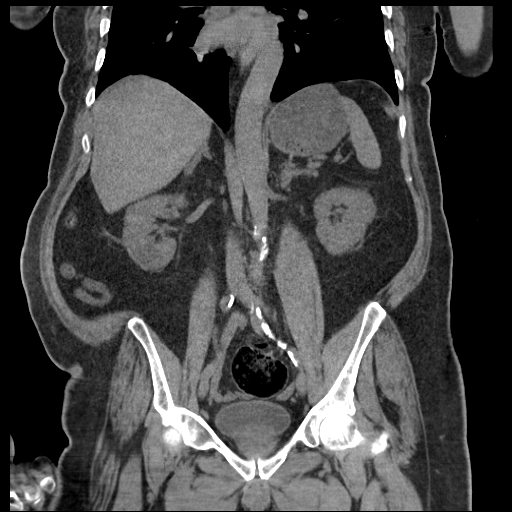

[17 of 46 positions shown; findings below may reference images not displayed]

FINDINGS: No evidence of renal calculi or hydronephrosis. No evidence of
ureteral calculi or dilatation. No bladder calculi identified.
Bilateral renal cysts again noted.

Surgical clips from prior cholecystectomy noted. Noncontrast images
of the liver, pancreas, spleen, adrenal glands are unremarkable. No
soft tissue masses or lymphadenopathy identified.

Diverticulosis again seen predominately involving the left colon. No
evidence of diverticulitis. No other inflammatory process or
abnormal fluid collections identified. Mixed lytic and sclerotic
bone lesion involving the L3 vertebral body is stable in appearance.
No other suspicious bone lesions identified. Old rib fracture
deformities again noted.
IMPRESSION: No evidence of urolithiasis of, hydronephrosis, or other acute
findings.

Diverticulosis. No radiographic evidence of diverticulitis.

Stable mixed lytic and sclerotic bone lesion involving the L3
vertebral body.

## 2015-02-13 ENCOUNTER — Telehealth: Payer: Self-pay | Admitting: Family Medicine

## 2015-02-13 MED ORDER — TAMSULOSIN HCL 0.4 MG PO CAPS: 0.4000 mg | ORAL_CAPSULE | Freq: Every day | ORAL | Status: DC

## 2015-02-13 MED ORDER — CLONIDINE HCL 0.2 MG PO TABS: 0.2000 mg | ORAL_TABLET | Freq: Three times a day (TID) | ORAL | Status: DC

## 2015-02-13 MED ORDER — SIMVASTATIN 20 MG PO TABS: 20.0000 mg | ORAL_TABLET | Freq: Every day | ORAL | Status: DC

## 2015-02-13 MED ORDER — LISINOPRIL 20 MG PO TABS
20.0000 mg | ORAL_TABLET | Freq: Every day | ORAL | Status: DC
Start: 2015-02-13 — End: 2015-11-03

## 2015-02-13 NOTE — Telephone Encounter (Signed)
Prescription sent to pharmacy.

## 2015-02-13 NOTE — Telephone Encounter (Signed)
Patient needs new rx for these medications: 1. Simvastatin  2. Clonidine  3.tamsulosin  4. Lisinopril To be called into the [facility] [place]  If any questions call [phone]

## 2015-02-18 ENCOUNTER — Ambulatory Visit (INDEPENDENT_AMBULATORY_CARE_PROVIDER_SITE_OTHER): Payer: Medicare Other | Admitting: Family Medicine

## 2015-02-18 ENCOUNTER — Encounter: Payer: Self-pay | Admitting: Family Medicine

## 2015-02-18 VITALS — BP 138/74 | HR 76 | Temp 98.2°F | Resp 16

## 2015-02-18 DIAGNOSIS — M129 Arthropathy, unspecified: Secondary | ICD-10-CM

## 2015-02-18 DIAGNOSIS — E1143 Type 2 diabetes mellitus with diabetic autonomic (poly)neuropathy: Secondary | ICD-10-CM

## 2015-02-18 DIAGNOSIS — M19019 Primary osteoarthritis, unspecified shoulder: Secondary | ICD-10-CM

## 2015-02-18 DIAGNOSIS — L0293 Carbuncle, unspecified: Secondary | ICD-10-CM

## 2015-02-18 DIAGNOSIS — B372 Candidiasis of skin and nail: Secondary | ICD-10-CM

## 2015-02-18 MED ORDER — DICLOFENAC SODIUM 1 % TD GEL: TRANSDERMAL | Status: DC

## 2015-02-18 MED ORDER — OXYCODONE-ACETAMINOPHEN 7.5-325 MG PO TABS: 1.0000 | ORAL_TABLET | ORAL | Status: DC | PRN

## 2015-02-18 MED ORDER — NYSTATIN 100000 UNIT/GM EX POWD: CUTANEOUS | Status: DC

## 2015-02-18 MED ORDER — SULFAMETHOXAZOLE-TRIMETHOPRIM 800-160 MG PO TABS: 1.0000 | ORAL_TABLET | Freq: Two times a day (BID) | ORAL | Status: DC

## 2015-02-18 NOTE — Assessment & Plan Note (Signed)
Known arthritis in shoulder and neck, he has some CKD due to his comorbidities. I will give him Voltaren gel to use he will continue his pain medication which was also refilled because of his current state of health neurosurgical intervention can be done and only chronic pain management

## 2015-02-18 NOTE — Progress Notes (Signed)
Patient ID: [name], male   DOB: 08-27-1946, 69 y.o.   MRN: [id number]   Subjective:    Patient ID: [name], male    DOB: Dec 03, 1946, 69 y.o.   MRN: [id number]  Patient presents for 4 month F/U; Skin Irritation; and L shoulder pain   patient here for follow-up. He was seen by his endocrinologist a couple months ago his A1c is down to 7.7% base which insurance carriers and his medications are now covered. Wife is concerned as he has some small knots have come out which looked like the beginnings of his typical boils one is on his chest he is one in his groin and one on right lower leg, he also has some moisture in the creases because he sits all day but the wife has noted no skin breakdown and no drainage from any lesions.  He pretty much spends most of his time sitting in his wheelchair in his legs are not elevated therefore he has chronic edema despite the Lasix. He also complains of neck and shoulder pain which are all chronic    Review Of Systems:  GEN- denies fatigue, fever, weight loss,weakness, recent illness HEENT- denies eye drainage, change in vision, nasal discharge, CVS- denies chest pain, palpitations RESP- denies SOB, cough, wheeze ABD- denies N/V, change in stools, abd pain GU- denies dysuria, hematuria, dribbling, incontinence MSK- + joint pain, muscle aches, injury Neuro- denies headache, dizziness, syncope, seizure activity       Objective:    BP 138/74 mmHg  Pulse 76  Temp(Src) 98.2 F (36.8 C) (Oral)  Resp 16 GEN- NAD, alert and oriented x3 HEENT- PERRL, EOMI, non injected sclera, pink conjunctiva, MMM, oropharynx clear Neck- STIFF rom, mild TTP over C spine, neg spurlings, TTP along trapeizus bilat,  CVS- RRR, no murmur RESP-CTAB MSK - Decreased ROM left shoulder/arm (unchanged) Neuro -Right hemiparesis, wheelchair bound EXT- 1+ pitting  Edema R>l  Pulses- Radial 2+ Skin- small infected hair follicle left chest wall, small cystic lesion Right  leg, NT, pt declines examination of groin        Assessment & Plan:      Problem List Items Addressed This Visit    None      Note: This dictation was prepared with Dragon dictation along with smaller phrase technology. Any transcriptional errors that result from this process are unintentional.

## 2015-02-18 NOTE — Patient Instructions (Signed)
Antibiotics as prescribed Nystatin powder Topical arthritis rub Recliner and elevate feet  Podiatry shoes will be sent in  F/U 3 months

## 2015-02-18 NOTE — Assessment & Plan Note (Signed)
Nystatin powder as prescribed

## 2015-02-18 NOTE — Assessment & Plan Note (Signed)
He has known diabetic neuropathy and is followed by endocrinology for his diabetes. He is due for a new pair of diabetic shoes we will help assist with the paperwork for this Per his wife the podiatrist sent this to his endocrinologist

## 2015-02-18 NOTE — Assessment & Plan Note (Signed)
He has recurrent skin infections for sleep today these are very small and are not like his typical large abscesses when he comes in. He declines examination in the groin but I suspect that he has the same thing as well as some intertrigo which he typically gets from moisture from sitting all day. I will give him Bactrim twice a day do not think the lesions need to be opened up based on how small they are he will also use nystatin powder in the groin area

## 2015-02-27 LAB — HEMOGLOBIN A1C: HEMOGLOBIN A1C: 7 % — AB (ref 4.0–6.0)

## 2015-03-10 ENCOUNTER — Ambulatory Visit: Payer: Medicare Other | Admitting: *Deleted

## 2015-03-27 ENCOUNTER — Encounter: Payer: Self-pay | Admitting: Podiatrist

## 2015-03-27 ENCOUNTER — Ambulatory Visit (INDEPENDENT_AMBULATORY_CARE_PROVIDER_SITE_OTHER): Payer: Medicare Other | Admitting: Podiatrist

## 2015-03-27 DIAGNOSIS — E1149 Type 2 diabetes mellitus with other diabetic neurological complication: Secondary | ICD-10-CM

## 2015-03-27 DIAGNOSIS — R609 Edema, unspecified: Secondary | ICD-10-CM

## 2015-03-27 DIAGNOSIS — E1140 Type 2 diabetes mellitus with diabetic neuropathy, unspecified: Secondary | ICD-10-CM

## 2015-03-27 DIAGNOSIS — M79673 Pain in unspecified foot: Secondary | ICD-10-CM

## 2015-03-27 DIAGNOSIS — B351 Tinea unguium: Secondary | ICD-10-CM | POA: Diagnosis not present

## 2015-03-27 NOTE — Patient Instructions (Signed)
Diabetes and Foot Care Diabetes may cause you to have problems because of poor blood supply (circulation) to your feet and legs. This may cause the skin on your feet to become thinner, break easier, and heal more slowly. Your skin may become dry, and the skin may peel and crack. You may also have nerve damage in your legs and feet causing decreased feeling in them. You may not notice minor injuries to your feet that could lead to infections or more serious problems. Taking care of your feet is one of the most important things you can do for yourself.  HOME CARE INSTRUCTIONS  Wear shoes at all times, even in the house. Do not go barefoot. Bare feet are easily injured.  Check your feet daily for blisters, cuts, and redness. If you cannot see the bottom of your feet, use a mirror or ask someone for help.  Wash your feet with warm water (do not use hot water) and mild soap. Then pat your feet and the areas between your toes until they are completely dry. Do not soak your feet as this can dry your skin.  Apply a moisturizing lotion or petroleum jelly (that does not contain alcohol and is unscented) to the skin on your feet and to dry, brittle toenails. Do not apply lotion between your toes.  Trim your toenails straight across. Do not dig under them or around the cuticle. File the edges of your nails with an emery board or nail file.  Do not cut corns or calluses or try to remove them with medicine.  Wear clean socks or stockings every day. Make sure they are not too tight. Do not wear knee-high stockings since they may decrease blood flow to your legs.  Wear shoes that fit properly and have enough cushioning. To break in new shoes, wear them for just a few hours a day. This prevents you from injuring your feet. Always look in your shoes before you put them on to be sure there are no objects inside.  Do not cross your legs. This may decrease the blood flow to your feet.  If you find a minor scrape,  cut, or break in the skin on your feet, keep it and the skin around it clean and dry. These areas may be cleansed with mild soap and water. Do not cleanse the area with peroxide, alcohol, or iodine.  When you remove an adhesive bandage, be sure not to damage the skin around it.  If you have a wound, look at it several times a day to make sure it is healing.  Do not use heating pads or hot water bottles. They may burn your skin. If you have lost feeling in your feet or legs, you may not know it is happening until it is too late.  Make sure your health care provider performs a complete foot exam at least annually or more often if you have foot problems. Report any cuts, sores, or bruises to your health care provider immediately. SEEK MEDICAL CARE IF:   You have an injury that is not healing.  You have cuts or breaks in the skin.  You have an ingrown nail.  You notice redness on your legs or feet.  You feel burning or tingling in your legs or feet.  You have pain or cramps in your legs and feet.  Your legs or feet are numb.  Your feet always feel cold. SEEK IMMEDIATE MEDICAL CARE IF:   There is increasing redness,   swelling, or pain in or around a wound.  There is a red line that goes up your leg.  Pus is coming from a wound.  You develop a fever or as directed by your health care provider.  You notice a bad smell coming from an ulcer or wound. Document Released: 11/25/2000 Document Revised: 07/31/2013 Document Reviewed: 05/07/2013 ExitCare Patient Information 2015 [employer]. This information is not intended to replace advice given to you by your health care provider. Make sure you discuss any questions you have with your health care provider.  

## 2015-03-27 NOTE — Progress Notes (Signed)
HPI: Patient presents today for follow up of diabetic foot and nail care. Past medical history, meds, and allergies reviewed. Patient had a stroke that affected his right side. He has significant swelling on the right foot and lower leg and relates pain in the bottom of the right foot today. He is wearing his diabetic shoes.  Objective:   Objective:  Patients chart is reviewed.  Vascular status reveals pedal pulses noted at  1 out of 4 dp and pt bilateral .  Neurological sensation is Decreased to [employer] monofilament bilateral at 2/5 sites bilateral.  Dermatological exam reveals  absence of pre ulcerative/ hyperkeratotic lesions.   Toenails are elongated, incurvated, discolored, dystrophic with ingrown deformity present.    Assessment: Diabetes with Neuropathy/Angiopathy ,symptomatic, mycotic toenails  Plan: Discussed treatment options and alternatives. Debrided nails without complication. Dispensed surgigrip for the right sided swelling.  Return appointment recommended at routine intervals of 3 months.   Trudie Buckler, DPM

## 2015-04-06 ENCOUNTER — Ambulatory Visit: Payer: Commercial Managed Care - HMO | Admitting: Family Medicine

## 2015-04-15 ENCOUNTER — Ambulatory Visit: Payer: Medicare Other | Admitting: *Deleted

## 2015-04-15 DIAGNOSIS — M79673 Pain in unspecified foot: Secondary | ICD-10-CM

## 2015-04-15 NOTE — Patient Instructions (Signed)

## 2015-04-16 NOTE — Progress Notes (Signed)
Patient ID: [name], male   DOB: 08-20-1946, 69 y.o.   MRN: [id number] Patient presents for diabetic shoe pick up shoes are not wide enough will reorder

## 2015-05-22 ENCOUNTER — Ambulatory Visit (INDEPENDENT_AMBULATORY_CARE_PROVIDER_SITE_OTHER): Payer: Medicare Other | Admitting: Family Medicine

## 2015-05-22 ENCOUNTER — Encounter: Payer: Self-pay | Admitting: Family Medicine

## 2015-05-22 VITALS — BP 136/78 | HR 78 | Temp 98.5°F | Resp 16

## 2015-05-22 DIAGNOSIS — K5909 Other constipation: Secondary | ICD-10-CM | POA: Insufficient documentation

## 2015-05-22 DIAGNOSIS — R197 Diarrhea, unspecified: Secondary | ICD-10-CM | POA: Diagnosis not present

## 2015-05-22 DIAGNOSIS — I10 Essential (primary) hypertension: Secondary | ICD-10-CM

## 2015-05-22 DIAGNOSIS — E785 Hyperlipidemia, unspecified: Secondary | ICD-10-CM

## 2015-05-22 DIAGNOSIS — G8929 Other chronic pain: Secondary | ICD-10-CM | POA: Diagnosis not present

## 2015-05-22 DIAGNOSIS — E669 Obesity, unspecified: Secondary | ICD-10-CM

## 2015-05-22 DIAGNOSIS — K5900 Constipation, unspecified: Secondary | ICD-10-CM

## 2015-05-22 DIAGNOSIS — N183 Chronic kidney disease, stage 3 unspecified: Secondary | ICD-10-CM

## 2015-05-22 DIAGNOSIS — E119 Type 2 diabetes mellitus without complications: Secondary | ICD-10-CM | POA: Diagnosis not present

## 2015-05-22 DIAGNOSIS — E1143 Type 2 diabetes mellitus with diabetic autonomic (poly)neuropathy: Secondary | ICD-10-CM | POA: Diagnosis not present

## 2015-05-22 DIAGNOSIS — R109 Unspecified abdominal pain: Secondary | ICD-10-CM

## 2015-05-22 LAB — CBC WITH DIFFERENTIAL/PLATELET
Basophils Absolute: 0.1 10*3/uL (ref 0.0–0.1)
Basophils Relative: 1 % (ref 0–1)
EOS ABS: 0.6 10*3/uL (ref 0.0–0.7)
Eosinophils Relative: 7 % — ABNORMAL HIGH (ref 0–5)
HEMATOCRIT: 39.8 % (ref 39.0–52.0)
HEMOGLOBIN: 13 g/dL (ref 13.0–17.0)
Lymphocytes Relative: 12 % (ref 12–46)
Lymphs Abs: 1 10*3/uL (ref 0.7–4.0)
MCH: 23.8 pg — AB (ref 26.0–34.0)
MCHC: 32.7 g/dL (ref 30.0–36.0)
MCV: 72.8 fL — ABNORMAL LOW (ref 78.0–100.0)
Monocytes Absolute: 0.8 10*3/uL (ref 0.1–1.0)
Monocytes Relative: 9 % (ref 3–12)
Neutro Abs: 6 10*3/uL (ref 1.7–7.7)
Neutrophils Relative %: 71 % (ref 43–77)
Platelets: 275 10*3/uL (ref 150–400)
RBC: 5.47 MIL/uL (ref 4.22–5.81)
RDW: 16.8 % — ABNORMAL HIGH (ref 11.5–15.5)
WBC: 8.5 10*3/uL (ref 4.0–10.5)

## 2015-05-22 LAB — COMPLETE METABOLIC PANEL WITH GFR
ALT: 24 U/L (ref 0–53)
AST: 19 U/L (ref 0–37)
Albumin: 3.6 g/dL (ref 3.5–5.2)
Alkaline Phosphatase: 85 U/L (ref 39–117)
BUN: 26 mg/dL — ABNORMAL HIGH (ref 6–23)
CALCIUM: 10.4 mg/dL (ref 8.4–10.5)
CHLORIDE: 104 meq/L (ref 96–112)
CO2: 22 mEq/L (ref 19–32)
Creat: 1.31 mg/dL (ref 0.50–1.35)
GFR, EST AFRICAN AMERICAN: 64 mL/min
GFR, Est Non African American: 56 mL/min — ABNORMAL LOW
Glucose, Bld: 135 mg/dL — ABNORMAL HIGH (ref 70–99)
Potassium: 4.6 mEq/L (ref 3.5–5.3)
SODIUM: 137 meq/L (ref 135–145)
Total Bilirubin: 0.2 mg/dL (ref 0.2–1.2)
Total Protein: 6.6 g/dL (ref 6.0–8.3)

## 2015-05-22 LAB — LIPID PANEL
CHOL/HDL RATIO: 3 ratio
Cholesterol: 133 mg/dL (ref 0–200)
HDL: 45 mg/dL (ref >=40)
LDL Cholesterol: 45 mg/dL (ref 0–99)
Triglycerides: 216 mg/dL — ABNORMAL HIGH (ref <150)
VLDL: 43 mg/dL — ABNORMAL HIGH (ref 0–40)

## 2015-05-22 MED ORDER — OXYCODONE-ACETAMINOPHEN 7.5-325 MG PO TABS: 1.0000 | ORAL_TABLET | ORAL | Status: DC | PRN

## 2015-05-22 MED ORDER — LUBIPROSTONE 24 MCG PO CAPS: 24.0000 ug | ORAL_CAPSULE | Freq: Two times a day (BID) | ORAL | Status: DC

## 2015-05-22 NOTE — Patient Instructions (Addendum)
Continue current medications Try the Amitza for the next week, call if this helps We will call with lab results F/U 6 months

## 2015-05-22 NOTE — Assessment & Plan Note (Signed)
Blood pressure well controlled, no change to meds

## 2015-05-22 NOTE — Assessment & Plan Note (Signed)
Trial of Amitiza BID, given samples, getting a medication to keep them out of the donut hole may be difficult Movantik is another good option as I do think this is mostly  OIC

## 2015-05-22 NOTE — Progress Notes (Signed)
Patient ID: [name], male   DOB: 10/05/1946, 69 y.o.   MRN: [id number]   Subjective:    Patient ID: [name], male    DOB: Apr 09, 1946, 69 y.o.   MRN: [id number]  Patient presents for 3 month F/U and Chronic Constipation  patient here to follow-up chronic medical problems. He still being followed by endocrinology for his diabetes mellitus. He is due today for fasting last for cholesterol and kidney function. His only concern today is constipation which is been worsening for the past couple months. He is on chronic pain medication due to chronic abdominal pain. His wife is tried [place] lax she's also tried over-the-counter Dulcolax and Senokot these will help move his bowels eventually. He denies any blood in the stool.  Medications refilled    Review Of Systems:  GEN- denies fatigue, fever, weight loss,weakness, recent illness HEENT- denies eye drainage, change in vision, nasal discharge, CVS- denies chest pain, palpitations RESP- denies SOB, cough, wheeze ABD- denies N/V, change in stools, abd pain GU- denies dysuria, hematuria, dribbling, incontinence MSK- +joint pain, muscle aches, injury Neuro- denies headache, dizziness, syncope, seizure activity       Objective:    BP 136/78 mmHg  Pulse 78  Temp(Src) 98.5 F (36.9 C) (Oral)  Resp 16 GEN- NAD, alert and oriented x3, sitting in wheelchair, obese HEENT- EOMI, non injected sclera, Left eye reactive, pink conjunctiva, MMM, oropharynx clear, CVS- RRR, no murmur RESP-CTAB ABD-NABS,soft,midl distension, mild TTP RUQ (chronic), no rebound EXT-1+ pittng edema bilat  Pulses- Radial, 2+      Assessment & Plan:      Problem List Items Addressed This Visit    None      Note: This dictation was prepared with Dragon dictation along with smaller phrase technology. Any transcriptional errors that result from this process are unintentional.

## 2015-05-22 NOTE — Assessment & Plan Note (Signed)
Followed by endocrinology, Goal A1C , 7% We will add on A1C is due per endocrine orders, I do not have the last one on file

## 2015-05-22 NOTE — Assessment & Plan Note (Signed)
Recheck lipids on zocor, check LFT

## 2015-05-27 ENCOUNTER — Ambulatory Visit (INDEPENDENT_AMBULATORY_CARE_PROVIDER_SITE_OTHER): Payer: Medicare Other | Admitting: Podiatry

## 2015-05-27 DIAGNOSIS — E1149 Type 2 diabetes mellitus with other diabetic neurological complication: Secondary | ICD-10-CM

## 2015-05-27 DIAGNOSIS — M2041 Other hammer toe(s) (acquired), right foot: Secondary | ICD-10-CM

## 2015-05-27 DIAGNOSIS — M2042 Other hammer toe(s) (acquired), left foot: Secondary | ICD-10-CM

## 2015-05-27 DIAGNOSIS — E1140 Type 2 diabetes mellitus with diabetic neuropathy, unspecified: Secondary | ICD-10-CM | POA: Diagnosis not present

## 2015-05-27 NOTE — Progress Notes (Signed)
Patient ID: [name], male   DOB: 04/27/1946, 69 y.o.   MRN: [id number] Patient presents for diabetic shoe pick up, shoes are tried on for good fit.  Patient received 1 pair Hush puppies Artis Delay men's 13 extra wide and 3 pairs custom diabetic inserts.  Written break in and wear instructions given. No new complaints at this time and there are no acute changes since last appointment. Follow-up as scheduled or directed to call sooner if there are any changes to his feet, or if there are any questions/concerns/change in symptoms.

## 2015-05-27 NOTE — Patient Instructions (Signed)

## 2015-06-02 ENCOUNTER — Telehealth: Payer: Self-pay | Admitting: *Deleted

## 2015-06-02 ENCOUNTER — Other Ambulatory Visit: Payer: Self-pay | Admitting: *Deleted

## 2015-06-02 MED ORDER — LUBIPROSTONE 24 MCG PO CAPS: 24.0000 ug | ORAL_CAPSULE | Freq: Two times a day (BID) | ORAL | Status: DC

## 2015-06-02 NOTE — Telephone Encounter (Signed)
Received call from patient wife, [name].   Reports that Amitiza is >$100.00 per month.   MD please advise.

## 2015-06-02 NOTE — Telephone Encounter (Signed)
Call placed to pharmacy.   Linzess is slightly cheaper, but still >$100.00. Patient currently in "Donut hole".   MD made aware and states that patient can try samples or pay out of pocket, or patient can change to OTC Miralax.   Patient wife [name] aware.

## 2015-06-02 NOTE — Telephone Encounter (Signed)
Try Linzess 147mg once a day

## 2015-06-08 ENCOUNTER — Other Ambulatory Visit: Payer: Self-pay

## 2015-06-22 ENCOUNTER — Telehealth: Payer: Self-pay | Admitting: Family Medicine

## 2015-06-22 NOTE — Telephone Encounter (Signed)
Ok with bactrim ds bid for 1 week but may need I+D

## 2015-06-22 NOTE — Telephone Encounter (Signed)
[phone] Pt wife is calling because the pt has some boils and she would like to have the antibiotic called in that Dr Buelah Manis has called in for him before (I offered an apt but pt wife denied apt stating that he has the frequently and would like for [place] to call him something in)   [employer]

## 2015-06-22 NOTE — Telephone Encounter (Signed)
Patient has recurrent boils that do sometimes require I&D and intertrigo.   PCP routinely covers with Bactrim DS.   MD please advise.

## 2015-06-23 ENCOUNTER — Other Ambulatory Visit: Payer: Self-pay | Admitting: Family Medicine

## 2015-06-23 MED ORDER — SULFAMETHOXAZOLE-TRIMETHOPRIM 800-160 MG PO TABS: 1.0000 | ORAL_TABLET | Freq: Two times a day (BID) | ORAL | Status: DC

## 2015-06-23 NOTE — Telephone Encounter (Signed)
Call placed to patient and patient wife [name] made aware.

## 2015-06-23 NOTE — Telephone Encounter (Signed)
Medication refilled per protocol. 

## 2015-06-26 ENCOUNTER — Encounter: Payer: Self-pay | Admitting: Podiatry

## 2015-06-26 ENCOUNTER — Ambulatory Visit (INDEPENDENT_AMBULATORY_CARE_PROVIDER_SITE_OTHER): Payer: Medicare Other | Admitting: Podiatry

## 2015-06-26 ENCOUNTER — Encounter (HOSPITAL_COMMUNITY): Payer: Self-pay | Admitting: *Deleted

## 2015-06-26 ENCOUNTER — Emergency Department (HOSPITAL_COMMUNITY)
Admission: EM | Admit: 2015-06-26 | Discharge: 2015-06-26 | Disposition: A | Discharge status: Home / Self Care | Payer: Medicare Other | Attending: Emergency Medicine | Admitting: Emergency Medicine

## 2015-06-26 VITALS — BP 119/68 | HR 79 | Resp 18

## 2015-06-26 DIAGNOSIS — Z86010 Personal history of colonic polyps: Secondary | ICD-10-CM | POA: Insufficient documentation

## 2015-06-26 DIAGNOSIS — M1990 Unspecified osteoarthritis, unspecified site: Secondary | ICD-10-CM | POA: Diagnosis not present

## 2015-06-26 DIAGNOSIS — Z8579 Personal history of other malignant neoplasms of lymphoid, hematopoietic and related tissues: Secondary | ICD-10-CM | POA: Insufficient documentation

## 2015-06-26 DIAGNOSIS — B351 Tinea unguium: Secondary | ICD-10-CM

## 2015-06-26 DIAGNOSIS — E119 Type 2 diabetes mellitus without complications: Secondary | ICD-10-CM | POA: Diagnosis not present

## 2015-06-26 DIAGNOSIS — L02212 Cutaneous abscess of back [any part, except buttock]: Secondary | ICD-10-CM | POA: Insufficient documentation

## 2015-06-26 DIAGNOSIS — G8929 Other chronic pain: Secondary | ICD-10-CM | POA: Diagnosis not present

## 2015-06-26 DIAGNOSIS — Z87891 Personal history of nicotine dependence: Secondary | ICD-10-CM | POA: Insufficient documentation

## 2015-06-26 DIAGNOSIS — I10 Essential (primary) hypertension: Secondary | ICD-10-CM | POA: Diagnosis not present

## 2015-06-26 DIAGNOSIS — E1143 Type 2 diabetes mellitus with diabetic autonomic (poly)neuropathy: Secondary | ICD-10-CM

## 2015-06-26 DIAGNOSIS — Z923 Personal history of irradiation: Secondary | ICD-10-CM | POA: Diagnosis not present

## 2015-06-26 DIAGNOSIS — Z8673 Personal history of transient ischemic attack (TIA), and cerebral infarction without residual deficits: Secondary | ICD-10-CM | POA: Insufficient documentation

## 2015-06-26 DIAGNOSIS — E785 Hyperlipidemia, unspecified: Secondary | ICD-10-CM | POA: Diagnosis not present

## 2015-06-26 DIAGNOSIS — M79673 Pain in unspecified foot: Secondary | ICD-10-CM | POA: Diagnosis not present

## 2015-06-26 DIAGNOSIS — E1140 Type 2 diabetes mellitus with diabetic neuropathy, unspecified: Secondary | ICD-10-CM

## 2015-06-26 DIAGNOSIS — Z8669 Personal history of other diseases of the nervous system and sense organs: Secondary | ICD-10-CM | POA: Diagnosis not present

## 2015-06-26 DIAGNOSIS — K219 Gastro-esophageal reflux disease without esophagitis: Secondary | ICD-10-CM | POA: Diagnosis not present

## 2015-06-26 DIAGNOSIS — M109 Gout, unspecified: Secondary | ICD-10-CM | POA: Insufficient documentation

## 2015-06-26 DIAGNOSIS — Z8509 Personal history of malignant neoplasm of other digestive organs: Secondary | ICD-10-CM | POA: Insufficient documentation

## 2015-06-26 DIAGNOSIS — Z794 Long term (current) use of insulin: Secondary | ICD-10-CM | POA: Insufficient documentation

## 2015-06-26 DIAGNOSIS — N400 Enlarged prostate without lower urinary tract symptoms: Secondary | ICD-10-CM | POA: Diagnosis not present

## 2015-06-26 DIAGNOSIS — Z792 Long term (current) use of antibiotics: Secondary | ICD-10-CM | POA: Insufficient documentation

## 2015-06-26 DIAGNOSIS — J45909 Unspecified asthma, uncomplicated: Secondary | ICD-10-CM | POA: Diagnosis not present

## 2015-06-26 DIAGNOSIS — E1149 Type 2 diabetes mellitus with other diabetic neurological complication: Secondary | ICD-10-CM

## 2015-06-26 DIAGNOSIS — Z79899 Other long term (current) drug therapy: Secondary | ICD-10-CM | POA: Diagnosis not present

## 2015-06-26 MED ORDER — SODIUM CHLORIDE 0.9 % IV BOLUS (SEPSIS): 500.0000 mL | Freq: Once | INTRAVENOUS | Status: DC

## 2015-06-26 MED ORDER — VANCOMYCIN HCL 10 G IV SOLR
1500.0000 mg | Freq: Once | INTRAVENOUS | Status: DC
  Filled 2015-06-26: qty 1500

## 2015-06-26 MED ORDER — LIDOCAINE-EPINEPHRINE (PF) 2 %-1:200000 IJ SOLN
20.0000 mL | Freq: Once | INTRAMUSCULAR | Status: AC
  Administered 2015-06-26: 20 mL via INTRADERMAL
  Filled 2015-06-26: qty 20

## 2015-06-26 NOTE — ED Notes (Signed)
Pt states multiple abscess and is currently on antibiotics. States large abscess noted to upper back today which has been draining a little. NAD.

## 2015-06-26 NOTE — ED Notes (Signed)
Unable to obtain IV, informed Dr. Lacinda Axon, who said to hold off for now.

## 2015-06-26 NOTE — Discharge Instructions (Signed)
Continue antibiotic. You will need to be rechecked by your primary care doctor on Monday. Ultimately, you will need a surgical procedure to remove the cyst sac on your back.

## 2015-06-27 NOTE — ED Provider Notes (Signed)
CSN: [id number]     Arrival date & time 06/26/15  1658 History   First MD Initiated Contact with Patient 06/26/15 1856     Chief Complaint  Patient presents with  . Abscess     (Consider location/radiation/quality/duration/timing/severity/associated sxs/prior Treatment) HPI ...... large abscess on left upper back for unknown period time. Patient has long history of multiple abscesses secondary to immobility and diabetes. No fever, sweats, chills. This particular area has been drained 2 in the past by general surgery.  Severity is moderate. Palpation makes pain worse  Past Medical History  Diagnosis Date  . Barrett's esophagus     EGD 03/23/2011 & EGD 2/09 bx proven  . Steatohepatitis     liver biopsy 2009  . DM (diabetes mellitus)   . Diverticulosis     TCS 03/23/11 pancolonic diverticula &TCS 5/08, pancolonic diverticula  . GERD (gastroesophageal reflux disease)   . HTN (hypertension)   . CVA (cerebral infarction) 1998    right sided deficit  . Gout   . Lymphoma 1974    XRT at [facility], right base of skull area  . Hepatitis     esosiniphilic, tx with prednisone  . Renal insufficiency   . 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 Hiatal hernia   . Colon polyp 03/23/2011    tubular adenoma, Dr. Gala Romney  . Hemorrhoids, internal 03/23/2011    tcs by Dr. Gala Romney  . Hyperlipidemia   . Hemorrhagic colitis 06/06/2012.  . Glaucoma (increased eye pressure)   . Arthritis   . Asthma     "hx of"  . 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 Lower facial weakness     Right  . Esotropia of left eye   . Edema of lower extremity 12/21/12    bilateral   . BPH (benign prostatic hyperplasia)   . Hx of radiation therapy 1974    right base of skull area-lymphoma  . Stroke 1998    right hemiparesis/plegia  . Chronic abdominal pain    Past Surgical History  Procedure Laterality Date  . Cholecystectomy    . Right lymph node removal    . Right video-assisted  thoracic surgery, pleurectomy, and pleurodesis  2011  . Esophagogastroduodenoscopy  02/05/08    goblet cell metaplasia/negative for H.pylori  . Colonoscopy  03/23/11    Dr. Gala Romney  pancolonic diverticula, hemorrhoids, tubular adenoma.. next tcs 03/2016  . Esophagogastroduodenoscopy  03/23/11    Dr. Gala Romney, barretts, hiatal hernia  . Mass biopsy  11/01/2012    Procedure: NECK MASS BIOPSY;  Surgeon: Ascencion Dike, MD;  Location: [facility];  Service: ENT;  Laterality: Right;  Excisional Bx Right Neck Mass; attempted external jugular cutdown of left side  . Parotidectomy  11/24/2012    Procedure: PAROTIDECTOMY;  Surgeon: Ascencion Dike, MD;  Location: [place];  Service: ENT;  Laterality: N/A;  Total parotidectomy  . Pleurectomy     Family History  Problem Relation Age of Onset  . Heart failure Father   . Heart failure Mother   . Heart failure Sister   . Heart failure Son    History  Substance Use Topics  . Smoking status: Former Smoker -- 3.00 packs/day    Types: Cigarettes    Quit date: 03/01/1997  . Smokeless tobacco: Never Used  . Alcohol Use: No    Review of Systems  All other systems reviewed and are negative.     Allergies  Review of patient's allergies indicates no known allergies.  Home Medications   Prior to Admission medications   Medication Sig Start Date End Date Taking? Authorizing Provider  allopurinol (ZYLOPRIM) 300 MG tablet Take 1 tablet (300 mg total) by mouth daily. 01/12/15  Yes [name], MD  amLODipine (NORVASC) 10 MG tablet Take 1 tablet (10 mg total) by mouth daily. 01/22/15  Yes [name], MD  cloNIDine (CATAPRES) 0.2 MG tablet Take 1 tablet (0.2 mg total) by mouth 3 (three) times daily. 02/13/15  Yes [name], MD  diclofenac sodium (VOLTAREN) 1 % GEL Apply dime size four times a day Patient taking differently: Apply 1 application topically 4 (four) times daily. Apply dime size four times a day 02/18/15  Yes [name], MD  furosemide (LASIX) 40  MG tablet Take 1 tablet (40 mg total) by mouth daily. 12/25/14  Yes [name], MD  gabapentin (NEURONTIN) 300 MG capsule TAKE 2 CAPSULES THREE TIMES DAILY 11/26/14  Yes [name], MD  hydrochlorothiazide (HYDRODIURIL) 25 MG tablet Take 1 tablet by mouth  daily Patient taking differently: Take 25 mg by mouth daily. Take 1 tablet by mouth  daily 02/28/14  Yes [name], MD  insulin glargine (LANTUS) 100 UNIT/ML injection Inject 0.4 mL (40 Units total) into the skin at bedtime. 07/31/13  Yes Samuella Cota, MD  insulin lispro (HUMALOG) 100 UNIT/ML injection Inject 20 Units into the skin 3 (three) times daily before meals. Uses according to sliding scale at home. And 14 units QAC   Yes Historical Provider, MD  levothyroxine (SYNTHROID, LEVOTHROID) 50 MCG tablet TAKE ONE TABLET BY MOUTH ONCE DAILY IN THE MORNING 06/08/15  Yes Historical Provider, MD  LINZESS 145 MCG CAPS capsule TAKE ONE CAPSULE BY  DAILY DAILY AS NEEDED FOR CONSTIPATION 06/02/15  Yes Historical Provider, MD  lisinopril (PRINIVIL,ZESTRIL) 20 MG tablet Take 1 tablet (20 mg total) by mouth daily. 02/13/15  Yes [name], MD  metFORMIN (GLUCOPHAGE) 500 MG tablet Take 500 mg by mouth 2 (two) times daily with a meal.   Yes Historical Provider, MD  metoprolol succinate (TOPROL-XL) 50 MG 24 hr tablet TAKE 1 TABLET BY MOUTH DAILY WITH OR IMMEDIATELY FOLLOWING A MEAL 06/23/15  Yes [name], MD  nystatin (MYCOSTATIN/NYSTOP) 100000 UNIT/GM POWD Apply to bilateral axilla twice a day as needed 02/18/15  Yes [name], MD  oxyCODONE-acetaminophen (PERCOCET) 7.5-325 MG per tablet Take 1 tablet by mouth every 4 (four) hours as needed. 05/22/15  Yes [name], MD  pantoprazole (PROTONIX) 40 MG tablet Take 1 tablet (40 mg total) by mouth daily. 12/25/14  Yes [name], MD  simvastatin (ZOCOR) 20 MG tablet Take 1 tablet (20 mg total) by mouth at bedtime. 02/13/15  Yes [name], MD   sulfamethoxazole-trimethoprim (BACTRIM DS,SEPTRA DS) 800-160 MG per tablet Take 1 tablet by mouth 2 (two) times daily. 06/23/15  Yes [name], MD  tamsulosin (FLOMAX) 0.4 MG CAPS capsule Take 1 capsule (0.4 mg total) by mouth daily. 02/13/15  Yes [name], MD  linagliptin (TRADJENTA) 5 MG TABS tablet Take 1 tablet by mouth  daily Patient taking differently: Take 5 mg by mouth daily. Take 1 tablet by mouth  daily 02/28/14   [name], MD  lubiprostone (AMITIZA) 24 MCG capsule Take 1 capsule (24 mcg total) by mouth 2 (two) times daily with a meal. Patient not taking: Reported on 06/26/2015  06/02/15   [name], MD   BP 138/88 mmHg  Pulse 79  Temp(Src) 98.7 F (37.1 C) (Oral)  Resp 18  Ht 6' (1.829 m)  Wt 265 lb (120.203 kg)  BMI 35.93 kg/m2  SpO2 99% Physical Exam  Constitutional: He is oriented to person, place, and time. He appears well-developed and well-nourished.  HENT:  Head: Normocephalic and atraumatic.  Eyes: Conjunctivae and EOM are normal. Pupils are equal, round, and reactive to light.  Neck: Normal range of motion. Neck supple.  Cardiovascular: Normal rate and regular rhythm.   Pulmonary/Chest: Effort normal and breath sounds normal.  Abdominal: Soft. Bowel sounds are normal.  Musculoskeletal: Normal range of motion.  Neurological: He is alert and oriented to person, place, and time.  Skin:  2.5 x 1.5 cm raised abscess on left upper back with area of induration surrounding abscess  Psychiatric: He has a normal mood and affect. His behavior is normal.  Nursing note and vitals reviewed.   ED Course  INCISION AND DRAINAGE Date/Time: 06/26/2015 10:00 PM Performed by: Nat Christen Authorized by: Nat Christen Consent: Verbal consent obtained. Risks and benefits: risks, benefits and alternatives were discussed Consent given by: patient and spouse Patient understanding: patient states understanding of the procedure being performed Site marked: the  operative site was marked Patient identity confirmed: verbally with patient Time out: Immediately prior to procedure a "time out" was called to verify the correct patient, procedure, equipment, support staff and site/side marked as required. Type: abscess Location: left upper back. Anesthesia: local infiltration Local anesthetic: lidocaine 1% with epinephrine Anesthetic total: 8 ml Patient sedated: no Scalpel size: 11 Incision type: single straight Complexity: complex Drainage: purulent and  serosanguinous Drainage amount: moderate Wound treatment: drain placed Packing material: 1/4 in iodoform gauze Patient tolerance: Patient tolerated the procedure well with no immediate complications   (including critical care time) Labs Review Labs Reviewed - No data to display  Imaging Review No results found.   EKG Interpretation None      MDM   Final diagnoses:  Abscess of back    Patient is presently on oral antibiotics for his abscess. Incision and drainage per examiner. I advised patient to continue antibiotic. Recheck on Monday. I also suggested that he might need a surgical procedure to remove the cystic sac.    Nat Christen, MD 06/27/15 [phone]

## 2015-06-28 NOTE — Progress Notes (Signed)
Patient ID: [name], male   DOB: 19-Dec-1945, 69 y.o.   MRN: [id number]  Complaint:  Visit Type: Patient returns to my office for continued preventative foot care services. Complaint: Patient states" my nails have grown long and thick and become painful to walk and wear shoes" Patient has been diagnosed with DM with  neuropathy.  Post CVA with significant swelling both feet.[name] He presents for preventative foot care services. No changes to ROS  Podiatric Exam: Vascular: dorsalis pedis and posterior tibial pulses are not palpable due to swelling both feet.. Capillary return is immediate. Temperature gradient is WNL. Skin turgor WNL  Sensorium: Diminished  [employer] monofilament test. Normal tactile sensation. Nail Exam: Pt has thick disfigured discolored nails with subungual debris noted bilateral entire nail hallux through fifth toenails Ulcer Exam: There is no evidence of ulcer or pre-ulcerative changes or infection. Orthopedic Exam: Muscle tone and strength are WNL. No limitations in general ROM. No crepitus or effusions noted. Foot type and digits show no abnormalities. Bony prominences are unremarkable. Skin: No Porokeratosis. No infection or ulcers  Diagnosis:  Tinea unguium, Pain in right toe, pain in left toes  Treatment & Plan Procedures and Treatment: Consent by patient was obtained for treatment procedures. The patient understood the discussion of treatment and procedures well. All questions were answered thoroughly reviewed. Debridement of mycotic and hypertrophic toenails, 1 through 5 bilateral and clearing of subungual debris. No ulceration, no infection noted.  Return Visit-Office Procedure: Patient instructed to return to the office for a follow up visit 3 months for continued evaluation and treatment.

## 2015-06-29 ENCOUNTER — Encounter: Payer: Self-pay | Admitting: Family Medicine

## 2015-06-29 ENCOUNTER — Ambulatory Visit (INDEPENDENT_AMBULATORY_CARE_PROVIDER_SITE_OTHER): Payer: Medicare Other | Admitting: Family Medicine

## 2015-06-29 VITALS — BP 130/72 | HR 76 | Temp 98.5°F | Resp 16

## 2015-06-29 DIAGNOSIS — L02212 Cutaneous abscess of back [any part, except buttock]: Secondary | ICD-10-CM

## 2015-06-29 NOTE — Progress Notes (Signed)
Patient ID: [name], male   DOB: 1946/04/26, 69 y.o.   MRN: [id number]   Subjective:    Patient ID: [name], male    DOB: [birth year], 69 y.o.   MRN: [id number]  Patient presents for ER F/U patient here to follow-up ER visit. He was seen in the ER on 06/26/2015 secondary to access on his back. He was started on bactrim however a few hours later he had a very large hole and his wife took him to the emergency room. He has had multiple boils drained from his back in the past. He has not had any fever. He has some tenderness over to another smaller lesion on his back. He still has some drainage from the one that was opened in the emergency room he does not appear to be healing well    Review Of Systems:  GEN- denies fatigue, fever, weight loss,weakness, recent illness HEENT- denies eye drainage, change in vision, nasal discharge, CVS- denies chest pain, palpitations RESP- denies SOB, cough, wheeze ABD- denies N/V, change in stools, abd pain Neuro- denies headache, dizziness, syncope, seizure activity       Objective:    BP 130/72 mmHg  Pulse 76  Temp(Src) 98.5 F (36.9 C) (Oral)  Resp 16 GEN- NAD, alert and oriented x3 CVS- RRR, no murmur RESP-CTAB Skin- mid back large 3cm opening, borders of wound heaped up, yellow drainage, TTP,  Right lower back- scar- TTP, upper back, small nodule TTP, no erythema          Assessment & Plan:      Problem List Items Addressed This Visit    None    Visit Diagnoses    Abscess of back    -  Primary    recurrent abscess to skin, the one on mid Back that was opened looks different from previous infections, / large infected cyst, will send to surgery for further debridement     Relevant Orders    Ambulatory referral to General Surgery       Note: This dictation was prepared with Dragon dictation along with smaller phrase technology. Any transcriptional errors that result from this process are unintentional.

## 2015-06-29 NOTE — Patient Instructions (Addendum)
Continue antibiotics Keep clean and covered Pain medicine as needed F/U as previous

## 2015-07-14 ENCOUNTER — Telehealth: Payer: Self-pay | Admitting: *Deleted

## 2015-07-14 NOTE — Telephone Encounter (Signed)
Received a call from Pain Management stating that this pt has refused his appt for Pain Clinic.

## 2015-07-15 ENCOUNTER — Other Ambulatory Visit: Payer: Self-pay | Admitting: Family Medicine

## 2015-07-15 NOTE — Telephone Encounter (Signed)
Refill appropriate and filled per protocol. 

## 2015-07-16 ENCOUNTER — Other Ambulatory Visit: Payer: Self-pay | Admitting: Family Medicine

## 2015-07-16 NOTE — Telephone Encounter (Signed)
Refill appropriate and filled per protocol. 

## 2015-08-06 ENCOUNTER — Other Ambulatory Visit: Payer: Self-pay | Admitting: Family Medicine

## 2015-08-06 NOTE — Telephone Encounter (Signed)
Refill appropriate and filled per protocol. 

## 2015-08-24 ENCOUNTER — Telehealth: Payer: Self-pay | Admitting: Family Medicine

## 2015-08-24 NOTE — Telephone Encounter (Signed)
Patient asking for rx on oxycodone  [phone]

## 2015-08-28 MED ORDER — OXYCODONE-ACETAMINOPHEN 7.5-325 MG PO TABS: 1.0000 | ORAL_TABLET | ORAL | Status: DC | PRN

## 2015-08-28 NOTE — Telephone Encounter (Signed)
Okay to refill? 

## 2015-08-28 NOTE — Telephone Encounter (Signed)
Prescription printed and patient made aware to come to office to pick up on 08/28/2015 at 3 pm.

## 2015-08-28 NOTE — Telephone Encounter (Signed)
Ok to refill??  Last office visit 06/29/2015.  Last refill 05/22/2015.

## 2015-09-09 ENCOUNTER — Ambulatory Visit (INDEPENDENT_AMBULATORY_CARE_PROVIDER_SITE_OTHER): Payer: Medicare Other | Admitting: "Endocrinology

## 2015-09-09 ENCOUNTER — Encounter: Payer: Self-pay | Admitting: "Endocrinology

## 2015-09-09 VITALS — BP 106/75 | HR 86 | Ht 70.0 in

## 2015-09-09 DIAGNOSIS — E785 Hyperlipidemia, unspecified: Secondary | ICD-10-CM | POA: Diagnosis not present

## 2015-09-09 DIAGNOSIS — I10 Essential (primary) hypertension: Secondary | ICD-10-CM | POA: Diagnosis not present

## 2015-09-09 DIAGNOSIS — N182 Chronic kidney disease, stage 2 (mild): Secondary | ICD-10-CM

## 2015-09-09 DIAGNOSIS — E039 Hypothyroidism, unspecified: Secondary | ICD-10-CM | POA: Insufficient documentation

## 2015-09-09 DIAGNOSIS — E1122 Type 2 diabetes mellitus with diabetic chronic kidney disease: Secondary | ICD-10-CM

## 2015-09-09 MED ORDER — LINAGLIPTIN 5 MG PO TABS
ORAL_TABLET | ORAL | Status: DC
Start: 2015-09-09 — End: 2017-06-26

## 2015-09-09 NOTE — Progress Notes (Signed)
Subjective:    Patient ID: [name], male    DOB: 12-Jul-1946,    Past Medical History  Diagnosis Date  . Barrett's esophagus     EGD 03/23/2011 & EGD 2/09 bx proven  . Steatohepatitis     liver biopsy 2009  . DM (diabetes mellitus)   . Diverticulosis     TCS 03/23/11 pancolonic diverticula &TCS 5/08, pancolonic diverticula  . GERD (gastroesophageal reflux disease)   . HTN (hypertension)   . CVA (cerebral infarction) 1998    right sided deficit  . Gout   . Lymphoma 1974    XRT at [facility], right base of skull area  . Hepatitis     esosiniphilic, tx with prednisone  . Renal insufficiency   . 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 Hiatal hernia   . Colon polyp 03/23/2011    tubular adenoma, Dr. Gala Romney  . Hemorrhoids, internal 03/23/2011    tcs by Dr. Gala Romney  . Hyperlipidemia   . Hemorrhagic colitis 06/06/2012.  . Glaucoma (increased eye pressure)   . Arthritis   . Asthma     "hx of"  . 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 Lower facial weakness     Right  . Esotropia of left eye   . Edema of lower extremity 12/21/12    bilateral   . BPH (benign prostatic hyperplasia)   . Hx of radiation therapy 1974    right base of skull area-lymphoma  . Stroke 1998    right hemiparesis/plegia  . Chronic abdominal pain    Past Surgical History  Procedure Laterality Date  . Cholecystectomy    . Right lymph node removal    . Right video-assisted thoracic surgery, pleurectomy, and pleurodesis  2011  . Esophagogastroduodenoscopy  02/05/08    goblet cell metaplasia/negative for H.pylori  . Colonoscopy  03/23/11    Dr. Gala Romney  pancolonic diverticula, hemorrhoids, tubular adenoma.. next tcs 03/2016  . Esophagogastroduodenoscopy  03/23/11    Dr. Gala Romney, barretts, hiatal hernia  . Mass biopsy  11/01/2012    Procedure: NECK MASS BIOPSY;  Surgeon: Ascencion Dike, MD;  Location: [facility];  Service: ENT;  Laterality: Right;  Excisional Bx Right Neck Mass;  attempted external jugular cutdown of left side  . Parotidectomy  11/24/2012    Procedure: PAROTIDECTOMY;  Surgeon: Ascencion Dike, MD;  Location: [facility];  Service: ENT;  Laterality: N/A;  Total parotidectomy  . Pleurectomy     Social History   Social History  . Marital Status: Married    Spouse Name: N/A  . Number of Children: 1  . Years of Education: N/A   Occupational History  . disabled    Social History Main Topics  . Smoking status: Former Smoker -- 3.00 packs/day    Types: Cigarettes    Quit date: 03/01/1997  . Smokeless tobacco: Never Used  . Alcohol Use: No  . Drug Use: No  . Sexual Activity: No   Other Topics Concern  . None   Social History Narrative   Outpatient Encounter Prescriptions as of 09/09/2015  Medication Sig  . allopurinol (ZYLOPRIM) 300 MG tablet Take 1 tablet (300 mg total) by mouth daily.  [name] amLODipine (NORVASC) 10 MG tablet Take 1 tablet (10 mg total) by mouth daily.  . cloNIDine (CATAPRES) 0.2 MG tablet Take 1 tablet (0.2 mg total) by mouth 3 (three) times daily.  . furosemide (LASIX) 40 MG  tablet TAKE 1 TABLET BY MOUTH DAILY  . gabapentin (NEURONTIN) 300 MG capsule TAKE 2 CAPSULES THREE TIMES DAILY  . insulin glargine (LANTUS) 100 UNIT/ML injection Inject 0.4 mL (40 Units total) into the skin at bedtime.  . insulin lispro (HUMALOG) 100 UNIT/ML injection Inject 10 Units into the skin 3 (three) times daily before meals. Uses according to sliding scale at home. And 14 units QAC  . levothyroxine (SYNTHROID, LEVOTHROID) 50 MCG tablet TAKE ONE TABLET BY MOUTH ONCE DAILY IN THE MORNING  . linagliptin (TRADJENTA) 5 MG TABS tablet Take 1 tablet by mouth  daily  . lisinopril (PRINIVIL,ZESTRIL) 20 MG tablet Take 1 tablet (20 mg total) by mouth daily.  . metFORMIN (GLUCOPHAGE) 500 MG tablet Take 500 mg by mouth 2 (two) times daily with a meal.  . metoprolol succinate (TOPROL-XL) 50 MG 24 hr tablet TAKE 1 TABLET BY MOUTH DAILY WITH OR IMMEDIATELY FOLLOWING A MEAL   . nystatin (MYCOSTATIN/NYSTOP) 100000 UNIT/GM POWD Apply to bilateral axilla twice a day as needed  . oxyCODONE-acetaminophen (PERCOCET) 7.5-325 MG per tablet Take 1 tablet by mouth every 4 (four) hours as needed.  . pantoprazole (PROTONIX) 40 MG tablet TAKE 1 TABLET BY MOUTH DAILY  . simvastatin (ZOCOR) 20 MG tablet Take 1 tablet (20 mg total) by mouth at bedtime.  . tamsulosin (FLOMAX) 0.4 MG CAPS capsule TAKE 1 CAPSULE BY MOUTH DAILY  . [DISCONTINUED] linagliptin (TRADJENTA) 5 MG TABS tablet Take 1 tablet by mouth  daily (Patient taking differently: Take 5 mg by mouth daily. Take 1 tablet by mouth  daily)  . diclofenac sodium (VOLTAREN) 1 % GEL Apply dime size four times a day (Patient not taking: Reported on 09/09/2015)  . hydrochlorothiazide (HYDRODIURIL) 25 MG tablet Take 1 tablet by mouth  daily (Patient not taking: Reported on 09/09/2015)  . LINZESS 145 MCG CAPS capsule TAKE ONE CAPSULE BY  DAILY DAILY AS NEEDED FOR CONSTIPATION  . lubiprostone (AMITIZA) 24 MCG capsule Take 1 capsule (24 mcg total) by mouth 2 (two) times daily with a meal. (Patient not taking: Reported on 09/09/2015)  . sulfamethoxazole-trimethoprim (BACTRIM DS,SEPTRA DS) 800-160 MG per tablet Take 1 tablet by mouth 2 (two) times daily. (Patient not taking: Reported on 09/09/2015)   No facility-administered encounter medications on file as of 09/09/2015.   ALLERGIES: No Known Allergies VACCINATION STATUS: Immunization History  Administered Date(s) Administered  . Influenza Split 08/24/2012  . Influenza,inj,Quad PF,36+ Mos 10/09/2013, 09/05/2014  . Pneumococcal Conjugate-13 01/07/2014  . Pneumococcal Polysaccharide-23 11/25/2012    Diabetes He presents for his follow-up diabetic visit. He has type 2 diabetes mellitus. Onset time: diagnosed approx at age 48. There are no hypoglycemic associated symptoms. Pertinent negatives for hypoglycemia include no confusion, headaches, pallor or seizures. Associated symptoms include  weakness. Pertinent negatives for diabetes include no chest pain, no fatigue, no polydipsia, no polyphagia and no polyuria. Symptoms are stable. Diabetic complications include a CVA. He never participates in exercise. His overall blood glucose range is 140-180 mg/dl. An ACE inhibitor/angiotensin II receptor blocker is being taken.  Hyperlipidemia Pertinent negatives include no chest pain, myalgias or shortness of breath.  Hypertension Pertinent negatives include no chest pain, headaches, neck pain, palpitations or shortness of breath. Hypertensive end-organ damage includes CVA.     Review of Systems  Constitutional: Negative for fatigue and unexpected weight change.  HENT: Negative for dental problem, mouth sores and trouble swallowing.   Eyes: Negative for visual disturbance.  Respiratory: Negative for cough, choking, chest tightness,  shortness of breath and wheezing.   Cardiovascular: Negative for chest pain, palpitations and leg swelling.  Gastrointestinal: Negative for nausea, vomiting, abdominal pain, diarrhea, constipation and abdominal distention.  Endocrine: Negative for polydipsia, polyphagia and polyuria.  Genitourinary: Negative for dysuria, urgency, hematuria and flank pain.  Musculoskeletal: Negative for myalgias, back pain, joint swelling, gait problem and neck pain.  Skin: Negative for pallor, rash and wound.  Neurological: Positive for weakness. Negative for seizures, numbness and headaches.  Psychiatric/Behavioral: Negative for suicidal ideas, hallucinations, confusion and dysphoric mood.    Objective:    BP 106/75 mmHg  Pulse 86  Ht 5' 10"  (1.778 m)  SpO2 98%  Wt Readings from Last 3 Encounters:  06/26/15 265 lb (120.203 kg)  06/20/14 265 lb (120.203 kg)  02/10/14 270 lb (122.471 kg)    Physical Exam  Constitutional: He is oriented to person, place, and time. He appears well-developed and well-nourished. He is cooperative.  HENT:  Head: Normocephalic and  atraumatic.  Eyes: EOM are normal.  Neck: Normal range of motion. Neck supple. No tracheal deviation present. No thyromegaly present.  Cardiovascular: Normal rate and normal heart sounds.  Exam reveals no gallop.   No murmur heard. Pulses:      Dorsalis pedis pulses are 1+ on the right side.       Posterior tibial pulses are 1+ on the right side.  Pulmonary/Chest: Breath sounds normal. No respiratory distress. He has no wheezes.  Abdominal: Soft. Bowel sounds are normal. He exhibits no distension.  Musculoskeletal: He exhibits no edema.  Neurological: He is alert and oriented to person, place, and time. A sensory deficit is present. No cranial nerve deficit. He exhibits normal muscle tone. Gait normal.  Pt is wheelchair bound due to past recurrent CVA  Skin: Skin is warm and dry. No rash noted. No cyanosis. Nails show no clubbing.  Psychiatric: He has a normal mood and affect. His speech is normal and behavior is normal. Judgment and thought content normal. Cognition and memory are normal.    Results for orders placed or performed in visit on 05/22/15  COMPLETE METABOLIC PANEL WITH GFR  Result Value Ref Range   Sodium 137 135 - 145 mEq/L   Potassium 4.6 3.5 - 5.3 mEq/L   Chloride 104 96 - 112 mEq/L   CO2 22 19 - 32 mEq/L   Glucose, Bld 135 (H) 70 - 99 mg/dL   BUN 26 (H) 6 - 23 mg/dL   Creat 1.31 0.50 - 1.35 mg/dL   Total Bilirubin 0.2 0.2 - 1.2 mg/dL   Alkaline Phosphatase 85 39 - 117 U/L   AST 19 0 - 37 U/L   ALT 24 0 - 53 U/L   Total Protein 6.6 6.0 - 8.3 g/dL   Albumin 3.6 3.5 - 5.2 g/dL   Calcium 10.4 8.4 - 10.5 mg/dL   GFR, Est African American 64 mL/min   GFR, Est Non African American 56 (L) mL/min  CBC with Differential/Platelet  Result Value Ref Range   WBC 8.5 4.0 - 10.5 K/uL   RBC 5.47 4.22 - 5.81 MIL/uL   Hemoglobin 13.0 13.0 - 17.0 g/dL   HCT 39.8 39.0 - 52.0 %   MCV 72.8 (L) 78.0 - 100.0 fL   MCH 23.8 (L) 26.0 - 34.0 pg   MCHC 32.7 30.0 - 36.0 g/dL   RDW 16.8  (H) 11.5 - 15.5 %   Platelets 275 150 - 400 K/uL   MPV Not Performed 8.6 - 12.4  fL   Neutrophils Relative % 71 43 - 77 %   Neutro Abs 6.0 1.7 - 7.7 K/uL   Lymphocytes Relative 12 12 - 46 %   Lymphs Abs 1.0 0.7 - 4.0 K/uL   Monocytes Relative 9 3 - 12 %   Monocytes Absolute 0.8 0.1 - 1.0 K/uL   Eosinophils Relative 7 (H) 0 - 5 %   Eosinophils Absolute 0.6 0.0 - 0.7 K/uL   Basophils Relative 1 0 - 1 %   Basophils Absolute 0.1 0.0 - 0.1 K/uL   Smear Review Criteria for review not met   Lipid panel  Result Value Ref Range   Cholesterol 133 0 - 200 mg/dL   Triglycerides 216 (H) <150 mg/dL   HDL 45 >=40 mg/dL   Total CHOL/HDL Ratio 3.0 Ratio   VLDL 43 (H) 0 - 40 mg/dL   LDL Cholesterol 45 0 - 99 mg/dL   Complete Blood Count (Most recent): Lab Results  Component Value Date   WBC 8.5 05/22/2015   HGB 13.0 05/22/2015   HCT 39.8 05/22/2015   MCV 72.8* 05/22/2015   PLT 275 05/22/2015   Chemistry (most recent): Lab Results  Component Value Date   NA 137 05/22/2015   K 4.6 05/22/2015   CL 104 05/22/2015   CO2 22 05/22/2015   BUN 26* 05/22/2015   CREATININE 1.31 05/22/2015   Diabetic Labs (most recent): Lab Results  Component Value Date   HGBA1C 7.0* 02/27/2015   HGBA1C 7.2* 07/24/2013   HGBA1C 7.9* 06/07/2012   Lipid profile (most recent): Lab Results  Component Value Date   TRIG 216* 05/22/2015   CHOL 133 05/22/2015        LABORATORY REVIEW: Complete Blood Count (Most recent): Lab Results  Component Value Date   WBC 8.5 05/22/2015   HGB 13.0 05/22/2015   HCT 39.8 05/22/2015   MCV 72.8* 05/22/2015   PLT 275 05/22/2015   Chemistry (most recent): Lab Results  Component Value Date   NA 137 05/22/2015   K 4.6 05/22/2015   CL 104 05/22/2015   CO2 22 05/22/2015   BUN 26* 05/22/2015   CREATININE 1.31 05/22/2015   Diabetic Labs (most recent): Lab Results  Component Value Date   HGBA1C 7.0* 02/27/2015   HGBA1C 7.2* 07/24/2013   HGBA1C 7.9* 06/07/2012    Lipid profile (most recent): Lab Results  Component Value Date   TRIG 216* 05/22/2015   CHOL 133 05/22/2015      Assessment & Plan:   1. Type 2 diabetes mellitus with stage 2 chronic kidney disease a1c is stable at 7.5%. I will  Continue Lantus 40 units daily at bedtime, and Humalog 10 units TIDAC plus correction. Continue Tradjenta 59m po qday. - Comprehensive metabolic panel; Future - Hemoglobin A1c; Future  2. Essential hypertension Controlled. Continuemeds including ACEI.  3. Hyperlipidemia Continue Simvastatin 20 mg po qhs. 4. Primary hypothyroidism Adequate LT4 at 50 mcg po qam . Continue . - T4, free; Future - TSH; Future   Follow up plan: Return in about 3 months (around 12/09/2015) for diabetes, high blood pressure, high cholesterol, underactive thyroid, with pre-visit labs, meter, and logs.  GGlade Lloyd MD Phone: [phone] Fax: [phone]  09/09/2015, 3:53 PM

## 2015-09-22 ENCOUNTER — Encounter: Payer: Self-pay | Admitting: Podiatry

## 2015-09-22 ENCOUNTER — Ambulatory Visit (INDEPENDENT_AMBULATORY_CARE_PROVIDER_SITE_OTHER): Payer: Medicare Other | Admitting: Podiatry

## 2015-09-22 DIAGNOSIS — E1149 Type 2 diabetes mellitus with other diabetic neurological complication: Secondary | ICD-10-CM

## 2015-09-22 DIAGNOSIS — B351 Tinea unguium: Secondary | ICD-10-CM | POA: Diagnosis not present

## 2015-09-22 DIAGNOSIS — M79673 Pain in unspecified foot: Secondary | ICD-10-CM | POA: Diagnosis not present

## 2015-09-22 NOTE — Progress Notes (Signed)
Patient ID: [name], male   DOB: 05-19-1946, 69 y.o.   MRN: [id number]  Complaint:  Visit Type: Patient returns to my office for continued preventative foot care services. Complaint: Patient states" my nails have grown long and thick and become painful to walk and wear shoes" Patient has been diagnosed with DM with  neuropathy.  Post CVA with significant swelling both feet.[name] He presents for preventative foot care services. No changes to ROS  Podiatric Exam: Vascular: dorsalis pedis and posterior tibial pulses are not palpable due to swelling both feet.. Capillary return is immediate. Temperature gradient is WNL. Skin turgor WNL  Sensorium: Diminished  Semmes Weinstein monofilament test. Normal tactile sensation. Nail Exam: Pt has thick disfigured discolored nails with subungual debris noted bilateral entire nail hallux through fifth toenails Ulcer Exam: There is no evidence of ulcer or pre-ulcerative changes or infection. Orthopedic Exam: Muscle tone and strength are WNL. No limitations in general ROM. No crepitus or effusions noted. Foot type and digits show no abnormalities. Bony prominences are unremarkable. Skin: No Porokeratosis. No infection or ulcers  Diagnosis:  Tinea unguium, Pain in right toe, pain in left toes  Treatment & Plan Procedures and Treatment: Consent by patient was obtained for treatment procedures. The patient understood the discussion of treatment and procedures well. All questions were answered thoroughly reviewed. Debridement of mycotic and hypertrophic toenails, 1 through 5 bilateral and clearing of subungual debris. No ulceration, no infection noted.  Return Visit-Office Procedure: Patient instructed to return to the office for a follow up visit 3 months for continued evaluation and treatment.

## 2015-10-18 ENCOUNTER — Other Ambulatory Visit: Payer: Self-pay | Admitting: Family Medicine

## 2015-10-19 NOTE — Telephone Encounter (Signed)
Refill appropriate and filled per protocol. 

## 2015-11-03 ENCOUNTER — Other Ambulatory Visit: Payer: Self-pay | Admitting: Family Medicine

## 2015-11-04 NOTE — Telephone Encounter (Signed)
Medication refilled per protocol. 

## 2015-11-13 ENCOUNTER — Other Ambulatory Visit: Payer: Self-pay | Admitting: Family Medicine

## 2015-11-13 ENCOUNTER — Other Ambulatory Visit: Payer: Self-pay | Admitting: "Endocrinology

## 2015-11-13 NOTE — Telephone Encounter (Signed)
Medication refilled per protocol. 

## 2015-11-16 ENCOUNTER — Telehealth: Payer: Self-pay | Admitting: Family Medicine

## 2015-11-16 MED ORDER — OXYCODONE-ACETAMINOPHEN 7.5-325 MG PO TABS: 1.0000 | ORAL_TABLET | ORAL | Status: DC | PRN

## 2015-11-16 NOTE — Telephone Encounter (Signed)
Ok to refill??  Last office visit 09/22/2015.  Last refill 08/28/2015.

## 2015-11-16 NOTE — Telephone Encounter (Signed)
okay

## 2015-11-16 NOTE — Telephone Encounter (Signed)
Patient calling to get rx for her oxycodone [phone]

## 2015-11-16 NOTE — Telephone Encounter (Signed)
Prescription printed and patient made aware to come to office to pick up on 11/17/2015.

## 2015-11-17 ENCOUNTER — Other Ambulatory Visit: Payer: Self-pay | Admitting: *Deleted

## 2015-11-17 MED ORDER — OXYCODONE-ACETAMINOPHEN 7.5-325 MG PO TABS: 1.0000 | ORAL_TABLET | ORAL | Status: DC | PRN

## 2015-11-23 ENCOUNTER — Ambulatory Visit (INDEPENDENT_AMBULATORY_CARE_PROVIDER_SITE_OTHER): Payer: Medicare Other | Admitting: Family Medicine

## 2015-11-23 ENCOUNTER — Encounter: Payer: Self-pay | Admitting: Family Medicine

## 2015-11-23 VITALS — BP 130/78 | HR 78 | Temp 97.7°F | Resp 18

## 2015-11-23 DIAGNOSIS — L0292 Furuncle, unspecified: Secondary | ICD-10-CM

## 2015-11-23 DIAGNOSIS — E785 Hyperlipidemia, unspecified: Secondary | ICD-10-CM

## 2015-11-23 DIAGNOSIS — I10 Essential (primary) hypertension: Secondary | ICD-10-CM | POA: Diagnosis not present

## 2015-11-23 DIAGNOSIS — Z125 Encounter for screening for malignant neoplasm of prostate: Secondary | ICD-10-CM

## 2015-11-23 DIAGNOSIS — N182 Chronic kidney disease, stage 2 (mild): Secondary | ICD-10-CM

## 2015-11-23 DIAGNOSIS — Z23 Encounter for immunization: Secondary | ICD-10-CM | POA: Diagnosis not present

## 2015-11-23 DIAGNOSIS — Z794 Long term (current) use of insulin: Secondary | ICD-10-CM | POA: Diagnosis not present

## 2015-11-23 DIAGNOSIS — N183 Chronic kidney disease, stage 3 unspecified: Secondary | ICD-10-CM

## 2015-11-23 DIAGNOSIS — L0293 Carbuncle, unspecified: Secondary | ICD-10-CM

## 2015-11-23 DIAGNOSIS — E1122 Type 2 diabetes mellitus with diabetic chronic kidney disease: Secondary | ICD-10-CM

## 2015-11-23 MED ORDER — OXYCODONE-ACETAMINOPHEN 7.5-325 MG PO TABS: 1.0000 | ORAL_TABLET | ORAL | Status: DC | PRN

## 2015-11-23 MED ORDER — SULFAMETHOXAZOLE-TRIMETHOPRIM 800-160 MG PO TABS: 1.0000 | ORAL_TABLET | Freq: Two times a day (BID) | ORAL | Status: DC

## 2015-11-23 NOTE — Assessment & Plan Note (Signed)
Well controlled 

## 2015-11-23 NOTE — Assessment & Plan Note (Signed)
Bactrim DS x 10 days, will call general surgery if no improvement

## 2015-11-23 NOTE — Patient Instructions (Signed)
Continue current medication Use the Tujeo instead of lantus Pain medication given for January  Take antibiotics We will call with lab results  F/U 4 months

## 2015-11-23 NOTE — Progress Notes (Signed)
Patient ID: [name], male   DOB: [birth year], 69 y.o.   MRN: [id number]   Subjective:    Patient ID: [name], male    DOB: August 04, 1946, 69 y.o.   MRN: [id number]  Patient presents for 6 month F/U and Abscess Pt  Here to follow-up.  His blood sugar ws running in the 300s last week.his wife did note that an abscess  Popped up on his lower back, no fever, no drainage, but feels tender.   Continues to have chronic abdominal pain but at times has lower back pain as well, which is chronic. Uses pain meds as needed   He is in [place] whole needs help with insulin. Still followed by Endocrinology  Requested PSA with testing   Due for flu shot    Review Of Systems:  GEN- denies fatigue, fever, weight loss,weakness, recent illness HEENT- denies eye drainage, change in vision, nasal discharge, CVS- denies chest pain, palpitations RESP- denies SOB, cough, wheeze ABD- denies N/V, change in stools, +abd pain GU- denies dysuria, hematuria, dribbling, incontinence MSK- + joint pain, muscle aches, injury Neuro- denies headache, dizziness, syncope, seizure activity       Objective:    BP 130/78 mmHg  Pulse 78  Temp(Src) 97.7 F (36.5 C) (Oral)  Resp 18 GEN- NAD, alert and oriented x3,sitting in wheelchair CVS- RRR, no murmur RESP-CTAB Skin- lower right  back - small abscess draining in center of previous I and D site,TTP,  , no erythema , at bedside moderate amount of pus expressed.  EXT- non pitting  Edema bilat- chronic  Pulses- Radial, DP- 2+        Assessment & Plan:      Problem List Items Addressed This Visit    Type 2 diabetes mellitus with stage 2 chronic kidney disease ([place])   Relevant Orders   Lipid panel   CBC with Differential/Platelet   Hyperlipidemia   Relevant Orders   Lipid panel    Other Visit Diagnoses    Need for prophylactic vaccination and inoculation against influenza    -  Primary    Relevant Orders    Flu Vaccine QUAD 36+ mos PF IM (Fluarix  & Fluzone Quad PF) (Completed)    Prostate cancer screening        Relevant Orders    PSA, Medicare       Note: This dictation was prepared with Dragon dictation along with smaller phrase technology. Any transcriptional errors that result from this process are unintentional.

## 2015-11-23 NOTE — Assessment & Plan Note (Signed)
Check lipids, goal LDL less than 100

## 2015-11-23 NOTE — Assessment & Plan Note (Signed)
Followed by endocrine, given Tujeo from office, can use in place of Lantus F/u with endocrine for labs in 2 weeks

## 2015-12-03 LAB — CBC WITH DIFFERENTIAL/PLATELET
BASOS PCT: 1 % (ref 0–1)
Basophils Absolute: 0.1 10*3/uL (ref 0.0–0.1)
Eosinophils Absolute: 0.9 10*3/uL — ABNORMAL HIGH (ref 0.0–0.7)
Eosinophils Relative: 12 % — ABNORMAL HIGH (ref 0–5)
HEMATOCRIT: 42.2 % (ref 39.0–52.0)
Hemoglobin: 13.4 g/dL (ref 13.0–17.0)
LYMPHS PCT: 25 % (ref 12–46)
Lymphs Abs: 1.8 10*3/uL (ref 0.7–4.0)
MCH: 22.9 pg — ABNORMAL LOW (ref 26.0–34.0)
MCHC: 31.8 g/dL (ref 30.0–36.0)
MCV: 72.3 fL — ABNORMAL LOW (ref 78.0–100.0)
Monocytes Absolute: 0.4 10*3/uL (ref 0.1–1.0)
Monocytes Relative: 5 % (ref 3–12)
NEUTROS PCT: 57 % (ref 43–77)
Neutro Abs: 4 10*3/uL (ref 1.7–7.7)
Platelets: 282 10*3/uL (ref 150–400)
RBC: 5.84 MIL/uL — AB (ref 4.22–5.81)
RDW: 18.9 % — ABNORMAL HIGH (ref 11.5–15.5)
WBC: 7.1 10*3/uL (ref 4.0–10.5)

## 2015-12-03 LAB — HEMOGLOBIN A1C: Hemoglobin A1C: 6.7

## 2015-12-04 LAB — LIPID PANEL
CHOLESTEROL: 146 mg/dL (ref 125–200)
HDL: 50 mg/dL (ref >=40)
LDL Cholesterol: 60 mg/dL (ref <130)
TRIGLYCERIDES: 180 mg/dL — AB (ref <150)
Total CHOL/HDL Ratio: 2.9 Ratio (ref <=5.0)
VLDL: 36 mg/dL — ABNORMAL HIGH (ref <30)

## 2015-12-04 LAB — PSA, MEDICARE: PSA: 1.67 ng/mL (ref <=4.00)

## 2015-12-09 ENCOUNTER — Other Ambulatory Visit: Payer: Self-pay | Admitting: Family Medicine

## 2015-12-10 ENCOUNTER — Encounter: Payer: Self-pay | Admitting: "Endocrinology

## 2015-12-10 ENCOUNTER — Ambulatory Visit (INDEPENDENT_AMBULATORY_CARE_PROVIDER_SITE_OTHER): Payer: Medicare Other | Admitting: "Endocrinology

## 2015-12-10 VITALS — BP 135/83 | HR 82 | Ht 70.0 in

## 2015-12-10 DIAGNOSIS — E039 Hypothyroidism, unspecified: Secondary | ICD-10-CM | POA: Diagnosis not present

## 2015-12-10 DIAGNOSIS — I10 Essential (primary) hypertension: Secondary | ICD-10-CM

## 2015-12-10 DIAGNOSIS — Z794 Long term (current) use of insulin: Secondary | ICD-10-CM | POA: Diagnosis not present

## 2015-12-10 DIAGNOSIS — E1122 Type 2 diabetes mellitus with diabetic chronic kidney disease: Secondary | ICD-10-CM | POA: Diagnosis not present

## 2015-12-10 DIAGNOSIS — E785 Hyperlipidemia, unspecified: Secondary | ICD-10-CM | POA: Diagnosis not present

## 2015-12-10 DIAGNOSIS — N182 Chronic kidney disease, stage 2 (mild): Secondary | ICD-10-CM | POA: Diagnosis not present

## 2015-12-10 NOTE — Patient Instructions (Signed)

## 2015-12-10 NOTE — Progress Notes (Signed)
Subjective:    Patient ID: [name], male    DOB: 08/09/1946,    Past Medical History  Diagnosis Date  . Barrett's esophagus     EGD 03/23/2011 & EGD 2/09 bx proven  . Steatohepatitis     liver biopsy 2009  . DM (diabetes mellitus) ([place])   . Diverticulosis     TCS 03/23/11 pancolonic diverticula &TCS 5/08, pancolonic diverticula  . GERD (gastroesophageal reflux disease)   . HTN (hypertension)   . CVA (cerebral infarction) 1998    right sided deficit  . Gout   . Lymphoma ([place]) 1974    XRT at [facility], right base of skull area  . Hepatitis     esosiniphilic, tx with prednisone  . Renal insufficiency   . 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 ([place]) 07/15/2011  . Hiatal hernia   . Colon polyp 03/23/2011    tubular adenoma, Dr. Gala Romney  . Hemorrhoids, internal 03/23/2011    tcs by Dr. Gala Romney  . Hyperlipidemia   . Hemorrhagic colitis 06/06/2012.  . Glaucoma (increased eye pressure)   . Arthritis   . Asthma     "hx of"  . Cancer of parotid gland ([place]) 11/23/12    Adenocarcinoma  . Lower facial weakness     Right  . Esotropia of left eye   . Edema of lower extremity 12/21/12    bilateral   . BPH (benign prostatic hyperplasia)   . Hx of radiation therapy 1974    right base of skull area-lymphoma  . Stroke [facility]) 1998    right hemiparesis/plegia  . Chronic abdominal pain    Past Surgical History  Procedure Laterality Date  . Cholecystectomy    . Right lymph node removal    . Right video-assisted thoracic surgery, pleurectomy, and pleurodesis  2011  . Esophagogastroduodenoscopy  02/05/08    goblet cell metaplasia/negative for H.pylori  . Colonoscopy  03/23/11    Dr. Gala Romney  pancolonic diverticula, hemorrhoids, tubular adenoma.. next tcs 03/2016  . Esophagogastroduodenoscopy  03/23/11    Dr. Gala Romney, barretts, hiatal hernia  . Mass biopsy  11/01/2012    Procedure: NECK MASS BIOPSY;  Surgeon: Ascencion Dike, MD;  Location: [facility];  Service: ENT;  Laterality: Right;   Excisional Bx Right Neck Mass; attempted external jugular cutdown of left side  . Parotidectomy  11/24/2012    Procedure: PAROTIDECTOMY;  Surgeon: Ascencion Dike, MD;  Location: [facility];  Service: ENT;  Laterality: N/A;  Total parotidectomy  . Pleurectomy     Social History   Social History  . Marital Status: Married    Spouse Name: N/A  . Number of Children: 1  . Years of Education: N/A   Occupational History  . disabled    Social History Main Topics  . Smoking status: Former Smoker -- 3.00 packs/day    Types: Cigarettes    Quit date: 03/01/1997  . Smokeless tobacco: Never Used  . Alcohol Use: No  . Drug Use: No  . Sexual Activity: No   Other Topics Concern  . None   Social History Narrative   Outpatient Encounter Prescriptions as of 12/10/2015  Medication Sig  . allopurinol (ZYLOPRIM) 300 MG tablet TAKE 1 TABLET BY MOUTH EVERY DAY  . amLODipine (NORVASC) 10 MG tablet TAKE 1 TABLET BY MOUTH DAILY  . cloNIDine (CATAPRES) 0.2 MG tablet TAKE 1 TABLET BY MOUTH THREE TIMES DAILY  . furosemide (LASIX) 40 MG tablet TAKE 1 TABLET  BY MOUTH DAILY  . gabapentin (NEURONTIN) 300 MG capsule TAKE 2 CAPSULES BY MOUTH THREE TIMES DAILY  . HUMALOG KWIKPEN 100 UNIT/ML KiwkPen INJECT UP TO 18 UNITS SUBCUTANEOUS THREE TIMES DAILY BEFORE A MEAL (Patient taking differently: INJECT UP TO 16 UNITS SUBCUTANEOUS THREE TIMES DAILY BEFORE A MEAL)  . hydrochlorothiazide (HYDRODIURIL) 25 MG tablet Take 1 tablet by mouth  daily  . LANTUS SOLOSTAR 100 UNIT/ML Solostar Pen INJECT 50 UNITS SUBCUTANEOUSLY EVERY NIGHT AT BEDTIME (Patient taking differently: INJECT 40 UNITS SUBCUTANEOUSLY EVERY NIGHT AT BEDTIME)  . levothyroxine (SYNTHROID, LEVOTHROID) 50 MCG tablet TAKE ONE TABLET BY MOUTH ONCE DAILY IN THE MORNING  . linagliptin (TRADJENTA) 5 MG TABS tablet Take 1 tablet by mouth  daily  . LINZESS 145 MCG CAPS capsule TAKE ONE CAPSULE BY  DAILY DAILY AS NEEDED FOR CONSTIPATION  . lisinopril (PRINIVIL,ZESTRIL) 20  MG tablet TAKE 1 TABLET BY MOUTH DAILY  . metFORMIN (GLUCOPHAGE) 500 MG tablet Take 500 mg by mouth 2 (two) times daily with a meal.  . metoprolol succinate (TOPROL-XL) 50 MG 24 hr tablet TAKE 1 TABLET BY MOUTH DAILY WITH OR IMMEDIATELY FOLLOWING A MEAL  . oxyCODONE-acetaminophen (PERCOCET) 7.5-325 MG tablet Take 1 tablet by mouth every 4 (four) hours as needed.  . pantoprazole (PROTONIX) 40 MG tablet TAKE 1 TABLET BY MOUTH DAILY  . simvastatin (ZOCOR) 20 MG tablet TAKE 1 TABLET BY MOUTH EVERY NIGHT AT BEDTIME  . sulfamethoxazole-trimethoprim (BACTRIM DS,SEPTRA DS) 800-160 MG tablet Take 1 tablet by mouth 2 (two) times daily.  . tamsulosin (FLOMAX) 0.4 MG CAPS capsule TAKE 1 CAPSULE BY MOUTH DAILY  . diclofenac sodium (VOLTAREN) 1 % GEL Apply dime size four times a day (Patient not taking: Reported on 12/10/2015)  . lubiprostone (AMITIZA) 24 MCG capsule Take 1 capsule (24 mcg total) by mouth 2 (two) times daily with a meal. (Patient not taking: Reported on 12/10/2015)  . nystatin (MYCOSTATIN/NYSTOP) 100000 UNIT/GM POWD Apply to bilateral axilla twice a day as needed (Patient not taking: Reported on 12/10/2015)   No facility-administered encounter medications on file as of 12/10/2015.   ALLERGIES: No Known Allergies VACCINATION STATUS: Immunization History  Administered Date(s) Administered  . Influenza Split 08/24/2012  . Influenza,inj,Quad PF,36+ Mos 10/09/2013, 09/05/2014, 11/23/2015  . Pneumococcal Conjugate-13 01/07/2014  . Pneumococcal Polysaccharide-23 11/25/2012    Diabetes He presents for his follow-up diabetic visit. He has type 2 diabetes mellitus. Onset time: diagnosed approx at age 66. There are no hypoglycemic associated symptoms. Pertinent negatives for hypoglycemia include no confusion, headaches, pallor or seizures. Associated symptoms include weakness. Pertinent negatives for diabetes include no chest pain, no fatigue, no polydipsia, no polyphagia and no polyuria. Symptoms  are stable. Diabetic complications include a CVA. He never participates in exercise. His overall blood glucose range is 140-180 mg/dl. An ACE inhibitor/angiotensin II receptor blocker is being taken.  Hyperlipidemia Pertinent negatives include no chest pain, myalgias or shortness of breath.  Hypertension Pertinent negatives include no chest pain, headaches, neck pain, palpitations or shortness of breath. Hypertensive end-organ damage includes CVA.     Review of Systems  Constitutional: Negative for fever, chills, fatigue and unexpected weight change.  HENT: Negative for dental problem, mouth sores and trouble swallowing.   Eyes: Negative for visual disturbance.  Respiratory: Negative for cough, choking, chest tightness, shortness of breath and wheezing.   Cardiovascular: Negative for chest pain, palpitations and leg swelling.  Gastrointestinal: Negative for nausea, vomiting, abdominal pain, diarrhea, constipation and abdominal distention.  Endocrine: Negative  for polydipsia, polyphagia and polyuria.  Genitourinary: Negative for dysuria, urgency, hematuria and flank pain.  Musculoskeletal: Negative for myalgias, back pain, joint swelling, gait problem and neck pain.  Skin: Negative for pallor, rash and wound.  Neurological: Positive for weakness. Negative for seizures, numbness and headaches.  Psychiatric/Behavioral: Negative for suicidal ideas, hallucinations, confusion and dysphoric mood.    Objective:    BP 135/83 mmHg  Pulse 82  Ht 5' 10"  (1.778 m)  SpO2 96%  Wt Readings from Last 3 Encounters:  06/26/15 265 lb (120.203 kg)  06/20/14 265 lb (120.203 kg)  02/10/14 270 lb (122.471 kg)    Physical Exam  Constitutional: He is oriented to person, place, and time. He appears well-developed and well-nourished. He is cooperative.  HENT:  Head: Normocephalic and atraumatic.  Eyes: EOM are normal.  Neck: Normal range of motion. Neck supple. No tracheal deviation present. No  thyromegaly present.  Cardiovascular: Normal rate and normal heart sounds.  Exam reveals no gallop.   No murmur heard. Pulses:      Dorsalis pedis pulses are 1+ on the right side.       Posterior tibial pulses are 1+ on the right side.  Pulmonary/Chest: Breath sounds normal. No respiratory distress. He has no wheezes.  Abdominal: Soft. Bowel sounds are normal. He exhibits no distension.  Musculoskeletal: He exhibits no edema.  Neurological: He is alert and oriented to person, place, and time. A sensory deficit is present. No cranial nerve deficit. He exhibits normal muscle tone. Gait normal.  Pt is wheelchair bound due to past recurrent CVA  Skin: Skin is warm and dry. No rash noted. No cyanosis. Nails show no clubbing.  Psychiatric: He has a normal mood and affect. His speech is normal and behavior is normal. Judgment and thought content normal. Cognition and memory are normal.    CMP  CMP Latest Ref Rng 05/22/2015 06/20/2014 08/08/2013  Glucose 70 - 99 mg/dL 135(H) 153(H) 74  BUN 6 - 23 mg/dL 26(H) 28(H) 13  Creatinine 0.50 - 1.35 mg/dL 1.31 1.39(H) 1.12  Sodium 135 - 145 mEq/L 137 141 136  Potassium 3.5 - 5.3 mEq/L 4.6 4.3 4.1  Chloride 96 - 112 mEq/L 104 103 104  CO2 19 - 32 mEq/L 22 25 23   Calcium 8.4 - 10.5 mg/dL 10.4 10.8(H) 10.5  Total Protein 6.0 - 8.3 g/dL 6.6 8.3 -  Total Bilirubin 0.2 - 1.2 mg/dL 0.2 0.2(L) -  Alkaline Phos 39 - 117 U/L 85 100 -  AST 0 - 37 U/L 19 16 -  ALT 0 - 53 U/L 24 18 -    Diabetic Labs (most recent): Lab Results  Component Value Date   HGBA1C 6.7 12/03/2015   HGBA1C 7.0* 02/27/2015   HGBA1C 7.2* 07/24/2013   Lipid Panel     Component Value Date/Time   CHOL 146 12/03/2015 0821   TRIG 180* 12/03/2015 0821   HDL 50 12/03/2015 0821   CHOLHDL 2.9 12/03/2015 0821   VLDL 36* 12/03/2015 0821   LDLCALC 60 12/03/2015 0821     Assessment & Plan:   1. Type 2 diabetes mellitus with stage 2 chronic kidney disease and CVA - patient remains at a  high risk for more acute and chronic complications of diabetes which include CAD, CVA, CKD, retinopathy, and neuropathy. These are all discussed in detail with the patient.  Patient came with controlled glucose profile, and  recent A1c of 6.7 %.  Glucose logs and insulin administration records pertaining to  this visit,  to be scanned into patient's records.  Recent labs reviewed.   - I have re-counseled the patient on diet management and weight loss  by adopting a carbohydrate restricted / protein rich  Diet.  - Suggestion is made for patient to avoid simple carbohydrates   from their diet including Cakes , Desserts, Ice Cream,  Soda (  diet and regular) , Sweet Tea , Candies,  Chips, Cookies, Artificial Sweeteners,   and "Sugar-free" Products .  This will help patient to have stable blood glucose profile and potentially avoid unintended  Weight gain.  - Patient is advised to stick to a routine mealtimes to eat 3 meals  a day and avoid unnecessary snacks ( to snack only to correct hypoglycemia).    - I have approached patient with the following individualized plan to manage diabetes and patient agrees.  - I will proceed with basal insulin Lantus 40 units QHS, and prandial insulin Humalog 10 units TIDAC for pre-meal BG readings of 90-135m/dl, plus patient specific correction dose of rapid acting insulin  for unexpected hyperglycemia above 1572mdl, associated with strict monitoring of glucose  AC and HS. - Patient is warned not to take insulin without proper monitoring per orders. -Adjustment parameters are given for hypo and hyperglycemia in writing. -Patient is encouraged to call clinic for blood glucose levels less than 70 or above 300 mg /dl. - I will continue Tradjenta 5 mg by mouth daily, therapeutically suitable for patient. -Patient is not a candidate for metformin andSGLT2 inhibitors due to CKD.  - Patient specific target  for A1c; LDL, HDL, Triglycerides, and  Waist Circumference were  discussed in detail.  2) BP/HTN: Controlled. Continue current medications including ACEI/ARB. 3) Lipids/HPL:  continue statins. 4)  Weight/Diet: He is limited on how much she can exercise and carbohydrates information provided. 5) hypothyroidism: He is euthyroid on levothyroxine 50 g by mouth every morning.  - We discussed about correct intake of levothyroxine, at fasting, with water, separated by at least 30 minutes from breakfast, and separated by more than 4 hours from calcium, iron, multivitamins, acid reflux medications (PPIs). -Patient is made aware of the fact that thyroid hormone replacement is needed for life, dose to be adjusted by periodic monitoring of thyroid function tests.  6) Chronic Care/Health Maintenance:  -Patient is on ACEI/ARB and Statin medications and encouraged to continue to follow up with Ophthalmology, Podiatrist at least yearly or according to recommendations, and advised to  stay away from smoking. I have recommended yearly flu vaccine and pneumonia vaccination at least every 5 years; moderate intensity exercise for up to 150 minutes weekly; and  sleep for at least 7 hours a day.  - 25 minutes of time was spent on the care of this patient , 50% of which was applied for counseling on diabetes complications and their preventions.  - I advised patient to maintain close follow up with Dr. DuBuelah Manisor primary care needs.  Patient is asked to bring meter and  blood glucose logs during their next visit.   Follow up plan: Return in about 3 months (around 03/09/2016) for diabetes, high blood pressure, high cholesterol, underactive thyroid, follow up with pre-visit labs, meter, and logs.  GeGlade LloydMD Phone: [phone]: [phone] 12/10/2015, 10:55 AM

## 2015-12-29 ENCOUNTER — Ambulatory Visit (INDEPENDENT_AMBULATORY_CARE_PROVIDER_SITE_OTHER): Payer: Medicare Other | Admitting: Podiatry

## 2015-12-29 ENCOUNTER — Encounter: Payer: Self-pay | Admitting: Podiatry

## 2015-12-29 DIAGNOSIS — E1149 Type 2 diabetes mellitus with other diabetic neurological complication: Secondary | ICD-10-CM

## 2015-12-29 DIAGNOSIS — B351 Tinea unguium: Secondary | ICD-10-CM

## 2015-12-29 DIAGNOSIS — M79673 Pain in unspecified foot: Secondary | ICD-10-CM | POA: Diagnosis not present

## 2015-12-29 NOTE — Progress Notes (Signed)
Patient ID: [name], male   DOB: Aug 25, 1946, 70 y.o.   MRN: [id number]  Complaint:  Visit Type: Patient returns to my office for continued preventative foot care services. Complaint: Patient states" my nails have grown long and thick and become painful to walk and wear shoes" Patient has been diagnosed with DM with  neuropathy.  Post CVA with significant swelling both feet.[name] He presents for preventative foot care services. No changes to ROS  Podiatric Exam: Vascular: dorsalis pedis and posterior tibial pulses are not palpable due to swelling both feet.. Capillary return is immediate. Temperature gradient is WNL. Skin turgor WNL  Sensorium: Diminished  Semmes Weinstein monofilament test. Normal tactile sensation. Nail Exam: Pt has thick disfigured discolored nails with subungual debris noted bilateral entire nail hallux through fifth toenails Ulcer Exam: There is no evidence of ulcer or pre-ulcerative changes or infection. Orthopedic Exam: Muscle tone and strength are WNL. No limitations in general ROM. No crepitus or effusions noted. Foot type and digits show no abnormalities. Bony prominences are unremarkable. Skin: No Porokeratosis. No infection or ulcers  Diagnosis:  Tinea unguium, Pain in right toe, pain in left toes  Treatment & Plan Procedures and Treatment: Consent by patient was obtained for treatment procedures. The patient understood the discussion of treatment and procedures well. All questions were answered thoroughly reviewed. Debridement of mycotic and hypertrophic toenails, 1 through 5 bilateral and clearing of subungual debris. No ulceration, no infection noted.  Return Visit-Office Procedure: Patient instructed to return to the office for a follow up visit 3 months for continued evaluation and treatment.   Gardiner Barefoot DPM

## 2016-01-15 ENCOUNTER — Other Ambulatory Visit: Payer: Self-pay | Admitting: Family Medicine

## 2016-01-15 NOTE — Telephone Encounter (Signed)
Refill appropriate and filled per protocol. 

## 2016-02-08 ENCOUNTER — Other Ambulatory Visit: Payer: Self-pay | Admitting: Family Medicine

## 2016-02-08 NOTE — Telephone Encounter (Signed)
Script sent to [facility]

## 2016-02-28 ENCOUNTER — Other Ambulatory Visit: Payer: Self-pay | Admitting: "Endocrinology

## 2016-03-04 ENCOUNTER — Other Ambulatory Visit: Payer: Self-pay | Admitting: "Endocrinology

## 2016-03-04 DIAGNOSIS — N182 Chronic kidney disease, stage 2 (mild): Secondary | ICD-10-CM | POA: Diagnosis not present

## 2016-03-04 DIAGNOSIS — E039 Hypothyroidism, unspecified: Secondary | ICD-10-CM | POA: Diagnosis not present

## 2016-03-04 DIAGNOSIS — E1122 Type 2 diabetes mellitus with diabetic chronic kidney disease: Secondary | ICD-10-CM | POA: Diagnosis not present

## 2016-03-04 DIAGNOSIS — Z794 Long term (current) use of insulin: Secondary | ICD-10-CM | POA: Diagnosis not present

## 2016-03-04 LAB — T4, FREE: Free T4: 1.4 ng/dL (ref 0.8–1.8)

## 2016-03-04 LAB — BASIC METABOLIC PANEL
BUN: 19 mg/dL (ref 7–25)
CALCIUM: 10.6 mg/dL — AB (ref 8.6–10.3)
CHLORIDE: 104 mmol/L (ref 98–110)
CO2: 22 mmol/L (ref 20–31)
Creat: 1.25 mg/dL (ref 0.70–1.25)
GLUCOSE: 69 mg/dL (ref 65–99)
Potassium: 4.4 mmol/L (ref 3.5–5.3)
SODIUM: 139 mmol/L (ref 135–146)

## 2016-03-04 LAB — HEMOGLOBIN A1C
Hgb A1c MFr Bld: 6.9 % — ABNORMAL HIGH (ref <5.7)
MEAN PLASMA GLUCOSE: 151 mg/dL — AB (ref <117)

## 2016-03-04 LAB — TSH: TSH: 2.66 mIU/L (ref 0.40–4.50)

## 2016-03-11 ENCOUNTER — Other Ambulatory Visit: Payer: Self-pay | Admitting: Family Medicine

## 2016-03-11 ENCOUNTER — Ambulatory Visit (INDEPENDENT_AMBULATORY_CARE_PROVIDER_SITE_OTHER): Payer: Medicare Other | Admitting: "Endocrinology

## 2016-03-11 ENCOUNTER — Encounter: Payer: Self-pay | Admitting: "Endocrinology

## 2016-03-11 VITALS — BP 113/80 | HR 80 | Ht 70.0 in

## 2016-03-11 DIAGNOSIS — Z794 Long term (current) use of insulin: Secondary | ICD-10-CM

## 2016-03-11 DIAGNOSIS — E1122 Type 2 diabetes mellitus with diabetic chronic kidney disease: Secondary | ICD-10-CM

## 2016-03-11 DIAGNOSIS — I10 Essential (primary) hypertension: Secondary | ICD-10-CM

## 2016-03-11 DIAGNOSIS — E039 Hypothyroidism, unspecified: Secondary | ICD-10-CM

## 2016-03-11 DIAGNOSIS — E785 Hyperlipidemia, unspecified: Secondary | ICD-10-CM

## 2016-03-11 DIAGNOSIS — N182 Chronic kidney disease, stage 2 (mild): Secondary | ICD-10-CM

## 2016-03-11 MED ORDER — INSULIN GLARGINE 100 UNIT/ML SOLOSTAR PEN: 40.0000 [IU] | PEN_INJECTOR | Freq: Every day | SUBCUTANEOUS | Status: DC

## 2016-03-11 MED ORDER — INSULIN LISPRO 100 UNIT/ML (KWIKPEN)
5.0000 [IU] | PEN_INJECTOR | Freq: Three times a day (TID) | SUBCUTANEOUS | Status: DC
Start: 2016-03-11 — End: 2017-05-11

## 2016-03-11 NOTE — Progress Notes (Signed)
Subjective:    Patient ID: [name], male    DOB: 1946/01/29,    Past Medical History  Diagnosis Date  . Barrett's esophagus     EGD 03/23/2011 & EGD 2/09 bx proven  . Steatohepatitis     liver biopsy 2009  . DM (diabetes mellitus) ([place])   . Diverticulosis     TCS 03/23/11 pancolonic diverticula &TCS 5/08, pancolonic diverticula  . GERD (gastroesophageal reflux disease)   . HTN (hypertension)   . CVA (cerebral infarction) 1998    right sided deficit  . Gout   . Lymphoma ([place]) 1974    XRT at [facility], right base of skull area  . Hepatitis     esosiniphilic, tx with prednisone  . Renal insufficiency   . 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 ([place]) 07/15/2011  . Hiatal hernia   . Colon polyp 03/23/2011    tubular adenoma, Dr. Gala Romney  . Hemorrhoids, internal 03/23/2011    tcs by Dr. Gala Romney  . Hyperlipidemia   . Hemorrhagic colitis 06/06/2012.  . Glaucoma (increased eye pressure)   . Arthritis   . Asthma     "hx of"  . Cancer of parotid gland ([place]) 11/23/12    Adenocarcinoma  . Lower facial weakness     Right  . Esotropia of left eye   . Edema of lower extremity 12/21/12    bilateral   . BPH (benign prostatic hyperplasia)   . Hx of radiation therapy 1974    right base of skull area-lymphoma  . Stroke [facility]) 1998    right hemiparesis/plegia  . Chronic abdominal pain    Past Surgical History  Procedure Laterality Date  . Cholecystectomy    . Right lymph node removal    . Right video-assisted thoracic surgery, pleurectomy, and pleurodesis  2011  . Esophagogastroduodenoscopy  02/05/08    goblet cell metaplasia/negative for H.pylori  . Colonoscopy  03/23/11    Dr. Gala Romney  pancolonic diverticula, hemorrhoids, tubular adenoma.. next tcs 03/2016  . Esophagogastroduodenoscopy  03/23/11    Dr. Gala Romney, barretts, hiatal hernia  . Mass biopsy  11/01/2012    Procedure: NECK MASS BIOPSY;  Surgeon: Ascencion Dike, MD;  Location: [facility];  Service: ENT;  Laterality: Right;   Excisional Bx Right Neck Mass; attempted external jugular cutdown of left side  . Parotidectomy  11/24/2012    Procedure: PAROTIDECTOMY;  Surgeon: Ascencion Dike, MD;  Location: [facility];  Service: ENT;  Laterality: N/A;  Total parotidectomy  . Pleurectomy     Social History   Social History  . Marital Status: Married    Spouse Name: N/A  . Number of Children: 1  . Years of Education: N/A   Occupational History  . disabled    Social History Main Topics  . Smoking status: Former Smoker -- 3.00 packs/day    Types: Cigarettes    Quit date: 03/01/1997  . Smokeless tobacco: Never Used  . Alcohol Use: No  . Drug Use: No  . Sexual Activity: No   Other Topics Concern  . None   Social History Narrative   Outpatient Encounter Prescriptions as of 03/11/2016  Medication Sig  . Insulin Glargine (LANTUS SOLOSTAR) 100 UNIT/ML Solostar Pen Inject 40 Units into the skin at bedtime.  . insulin lispro (HUMALOG KWIKPEN) 100 UNIT/ML KiwkPen Inject 0.05-0.11 mLs (5-11 Units total) into the skin 3 (three) times daily with meals.  . [DISCONTINUED] Insulin Glargine (LANTUS SOLOSTAR [place]) Inject 40  Units into the skin at bedtime.  . [DISCONTINUED] Insulin Lispro (HUMALOG KWIKPEN [place]) Inject 5-11 Units into the skin 3 (three) times daily with meals.  [name] allopurinol (ZYLOPRIM) 300 MG tablet TAKE 1 TABLET BY MOUTH EVERY DAY  . amLODipine (NORVASC) 10 MG tablet TAKE 1 TABLET BY MOUTH DAILY  . cloNIDine (CATAPRES) 0.2 MG tablet TAKE 1 TABLET BY MOUTH THREE TIMES DAILY  . diclofenac sodium (VOLTAREN) 1 % GEL Apply dime size four times a day  . furosemide (LASIX) 40 MG tablet TAKE 1 TABLET BY MOUTH DAILY  . gabapentin (NEURONTIN) 300 MG capsule TAKE 2 CAPSULES BY MOUTH THREE TIMES DAILY  . hydrochlorothiazide (HYDRODIURIL) 25 MG tablet Take 1 tablet by mouth  daily  . levothyroxine (SYNTHROID, LEVOTHROID) 50 MCG tablet TAKE 1 TABLET BY MOUTH EVERY MORNING  . linagliptin (TRADJENTA) 5 MG TABS tablet Take 1 tablet by  mouth  daily  . LINZESS 145 MCG CAPS capsule TAKE ONE CAPSULE BY  DAILY DAILY AS NEEDED FOR CONSTIPATION  . lisinopril (PRINIVIL,ZESTRIL) 20 MG tablet TAKE 1 TABLET BY MOUTH DAILY  . lubiprostone (AMITIZA) 24 MCG capsule Take 1 capsule (24 mcg total) by mouth 2 (two) times daily with a meal.  . metFORMIN (GLUCOPHAGE) 500 MG tablet Take 500 mg by mouth 2 (two) times daily with a meal.  . nystatin (MYCOSTATIN/NYSTOP) 100000 UNIT/GM POWD Apply to bilateral axilla twice a day as needed  . oxyCODONE-acetaminophen (PERCOCET) 7.5-325 MG tablet Take 1 tablet by mouth every 4 (four) hours as needed.  . pantoprazole (PROTONIX) 40 MG tablet TAKE 1 TABLET BY MOUTH DAILY  . simvastatin (ZOCOR) 20 MG tablet TAKE 1 TABLET BY MOUTH EVERY NIGHT AT BEDTIME  . sulfamethoxazole-trimethoprim (BACTRIM DS,SEPTRA DS) 800-160 MG tablet Take 1 tablet by mouth 2 (two) times daily.  . tamsulosin (FLOMAX) 0.4 MG CAPS capsule TAKE 1 CAPSULE BY MOUTH DAILY  . tiZANidine (ZANAFLEX) 2 MG tablet TAKE 1 TABLET BY MOUTH TWICE DAILY AS NEEDED  . [DISCONTINUED] HUMALOG KWIKPEN 100 UNIT/ML KiwkPen INJECT UP TO 18 UNITS SUBCUTANEOUS THREE TIMES DAILY BEFORE A MEAL (Patient taking differently: INJECT UP TO 16 UNITS SUBCUTANEOUS THREE TIMES DAILY BEFORE A MEAL)  . [DISCONTINUED] LANTUS SOLOSTAR 100 UNIT/ML Solostar Pen INJECT 50 UNITS SUBCUTANEOUSLY EVERY NIGHT AT BEDTIME (Patient taking differently: INJECT 40 UNITS SUBCUTANEOUSLY EVERY NIGHT AT BEDTIME)  . [DISCONTINUED] metoprolol succinate (TOPROL-XL) 50 MG 24 hr tablet TAKE 1 TABLET BY MOUTH DAILY WITH OR IMMEDIATELY FOLLOWING A MEAL   No facility-administered encounter medications on file as of 03/11/2016.   ALLERGIES: No Known Allergies VACCINATION STATUS: Immunization History  Administered Date(s) Administered  . Influenza Split 08/24/2012  . Influenza,inj,Quad PF,36+ Mos 10/09/2013, 09/05/2014, 11/23/2015  . Pneumococcal Conjugate-13 01/07/2014  . Pneumococcal  Polysaccharide-23 11/25/2012    Diabetes He presents for his follow-up diabetic visit. He has type 2 diabetes mellitus. Onset time: diagnosed approx at age 31. There are no hypoglycemic associated symptoms. Pertinent negatives for hypoglycemia include no confusion, headaches, pallor or seizures. Associated symptoms include weakness. Pertinent negatives for diabetes include no chest pain, no fatigue, no polydipsia, no polyphagia and no polyuria. Symptoms are stable. Diabetic complications include a CVA. He never participates in exercise. His overall blood glucose range is 140-180 mg/dl. An ACE inhibitor/angiotensin II receptor blocker is being taken.  Hyperlipidemia Pertinent negatives include no chest pain, myalgias or shortness of breath.  Hypertension Pertinent negatives include no chest pain, headaches, neck pain, palpitations or shortness of breath. Hypertensive end-organ damage includes CVA.  Review of Systems  Constitutional: Negative for fever, chills, fatigue and unexpected weight change.  HENT: Negative for dental problem, mouth sores and trouble swallowing.   Eyes: Negative for visual disturbance.  Respiratory: Negative for cough, choking, chest tightness, shortness of breath and wheezing.   Cardiovascular: Negative for chest pain, palpitations and leg swelling.  Gastrointestinal: Negative for nausea, vomiting, abdominal pain, diarrhea, constipation and abdominal distention.  Endocrine: Negative for polydipsia, polyphagia and polyuria.  Genitourinary: Negative for dysuria, urgency, hematuria and flank pain.  Musculoskeletal: Negative for myalgias, back pain, joint swelling, gait problem and neck pain.  Skin: Negative for pallor, rash and wound.  Neurological: Positive for weakness. Negative for seizures, numbness and headaches.  Psychiatric/Behavioral: Negative for suicidal ideas, hallucinations, confusion and dysphoric mood.    Objective:    BP 113/80 mmHg  Pulse 80  Ht  5' 10"  (1.778 m)  SpO2 98%  Wt Readings from Last 3 Encounters:  06/26/15 265 lb (120.203 kg)  06/20/14 265 lb (120.203 kg)  02/10/14 270 lb (122.471 kg)    Physical Exam  Constitutional: He is oriented to person, place, and time. He appears well-developed and well-nourished. He is cooperative.  HENT:  Head: Normocephalic and atraumatic.  Eyes: EOM are normal.  Neck: Normal range of motion. Neck supple. No tracheal deviation present. No thyromegaly present.  Cardiovascular: Normal rate and normal heart sounds.  Exam reveals no gallop.   No murmur heard. Pulses:      Dorsalis pedis pulses are 1+ on the right side.       Posterior tibial pulses are 1+ on the right side.  Pulmonary/Chest: Breath sounds normal. No respiratory distress. He has no wheezes.  Abdominal: Soft. Bowel sounds are normal. He exhibits no distension.  Musculoskeletal: He exhibits no edema.  Neurological: He is alert and oriented to person, place, and time. A sensory deficit is present. No cranial nerve deficit. He exhibits normal muscle tone. Gait normal.  Pt is wheelchair bound due to past recurrent CVA  Skin: Skin is warm and dry. No rash noted. No cyanosis. Nails show no clubbing.  Psychiatric: He has a normal mood and affect. His speech is normal and behavior is normal. Judgment and thought content normal. Cognition and memory are normal.    CMP  CMP Latest Ref Rng 03/04/2016 05/22/2015 06/20/2014  Glucose 65 - 99 mg/dL 69 135(H) 153(H)  BUN 7 - 25 mg/dL 19 26(H) 28(H)  Creatinine 0.70 - 1.25 mg/dL 1.25 1.31 1.39(H)  Sodium 135 - 146 mmol/L 139 137 141  Potassium 3.5 - 5.3 mmol/L 4.4 4.6 4.3  Chloride 98 - 110 mmol/L 104 104 103  CO2 20 - 31 mmol/L 22 22 25   Calcium 8.6 - 10.3 mg/dL 10.6(H) 10.4 10.8(H)  Total Protein 6.0 - 8.3 g/dL - 6.6 8.3  Total Bilirubin 0.2 - 1.2 mg/dL - 0.2 0.2(L)  Alkaline Phos 39 - 117 U/L - 85 100  AST 0 - 37 U/L - 19 16  ALT 0 - 53 U/L - 24 18    Diabetic Labs (most  recent): Lab Results  Component Value Date   HGBA1C 6.9* 03/04/2016   HGBA1C 6.7 12/03/2015   HGBA1C 7.0* 02/27/2015   Lipid Panel     Component Value Date/Time   CHOL 146 12/03/2015 0821   TRIG 180* 12/03/2015 0821   HDL 50 12/03/2015 0821   CHOLHDL 2.9 12/03/2015 0821   VLDL 36* 12/03/2015 0821   LDLCALC 60 12/03/2015 0821     Assessment &  Plan:   1. Type 2 diabetes mellitus with stage 2 chronic kidney disease and CVA - patient remains at a high risk for more acute and chronic complications of diabetes which include CAD, CVA, CKD, retinopathy, and neuropathy. These are all discussed in detail with the patient.  Patient came with controlled glucose profile, and  recent A1c of 6.7 %.  Glucose logs and insulin administration records pertaining to this visit,  to be scanned into patient's records.  Recent labs reviewed.   - I have re-counseled the patient on diet management and weight loss  by adopting a carbohydrate restricted / protein rich  Diet.  - Suggestion is made for patient to avoid simple carbohydrates   from their diet including Cakes , Desserts, Ice Cream,  Soda (  diet and regular) , Sweet Tea , Candies,  Chips, Cookies, Artificial Sweeteners,   and "Sugar-free" Products .  This will help patient to have stable blood glucose profile and potentially avoid unintended  Weight gain.  - Patient is advised to stick to a routine mealtimes to eat 3 meals  a day and avoid unnecessary snacks ( to snack only to correct hypoglycemia).    - I have approached patient with the following individualized plan to manage diabetes and patient agrees.  - I will proceed with basal insulin Lantus 40 units QHS, and lower  Humalog to 5 units TIDAC for pre-meal BG readings of 90-1104m/dl, plus patient specific correction dose of rapid acting insulin  for unexpected hyperglycemia above 1553mdl, associated with strict monitoring of glucose  AC and HS. - Patient is warned not to take insulin  without proper monitoring per orders. -Adjustment parameters are given for hypo and hyperglycemia in writing. -Patient is encouraged to call clinic for blood glucose levels less than 70 or above 300 mg /dl. - I will continue  Metformin 50056mo BID, Tradjenta 5 mg by mouth daily, therapeutically suitable for patient. -Patient is not a candidate for metformin andSGLT2 inhibitors due to CKD.  - Patient specific target  for A1c; LDL, HDL, Triglycerides, and  Waist Circumference were discussed in detail.  2) BP/HTN: Controlled. Continue current medications including ACEI/ARB. 3) Lipids/HPL:  continue statins. 4)  Weight/Diet: He is limited on how much she can exercise and carbohydrates information provided.  5) hypothyroidism: He is euthyroid on levothyroxine 50 g by mouth every morning.  - We discussed about correct intake of levothyroxine, at fasting, with water, separated by at least 30 minutes from breakfast, and separated by more than 4 hours from calcium, iron, multivitamins, acid reflux medications (PPIs). -Patient is made aware of the fact that thyroid hormone replacement is needed for life, dose to be adjusted by periodic monitoring of thyroid function tests.  6) Chronic Care/Health Maintenance:  -Patient is on ACEI/ARB and Statin medications and encouraged to continue to follow up with Ophthalmology, Podiatrist at least yearly or according to recommendations, and advised to  stay away from smoking. I have recommended yearly flu vaccine and pneumonia vaccination at least every 5 years; moderate intensity exercise for up to 150 minutes weekly; and  sleep for at least 7 hours a day.  - 25 minutes of time was spent on the care of this patient , 50% of which was applied for counseling on diabetes complications and their preventions.  - I advised patient to maintain close follow up with Dr. DurBuelah Manisr primary care needs.  Patient is asked to bring meter and  blood glucose logs during their  next visit.   Follow up plan: Return in about 3 months (around 06/10/2016) for diabetes, high blood pressure, high cholesterol, underactive thyroid, follow up with pre-visit labs, meter, and logs.  Glade Lloyd, MD Phone: [phone]  Fax: [phone]   03/11/2016, 11:55 AM

## 2016-03-11 NOTE — Telephone Encounter (Signed)
Refill appropriate and filled per protocol. 

## 2016-03-11 NOTE — Patient Instructions (Signed)

## 2016-03-23 ENCOUNTER — Ambulatory Visit (INDEPENDENT_AMBULATORY_CARE_PROVIDER_SITE_OTHER): Payer: Medicare Other | Admitting: Family Medicine

## 2016-03-23 ENCOUNTER — Encounter: Payer: Self-pay | Admitting: Family Medicine

## 2016-03-23 VITALS — BP 118/74 | HR 68 | Temp 98.2°F | Resp 16

## 2016-03-23 DIAGNOSIS — I69959 Hemiplegia and hemiparesis following unspecified cerebrovascular disease affecting unspecified side: Secondary | ICD-10-CM

## 2016-03-23 DIAGNOSIS — R600 Localized edema: Secondary | ICD-10-CM | POA: Diagnosis not present

## 2016-03-23 DIAGNOSIS — L309 Dermatitis, unspecified: Secondary | ICD-10-CM | POA: Diagnosis not present

## 2016-03-23 DIAGNOSIS — H409 Unspecified glaucoma: Secondary | ICD-10-CM | POA: Diagnosis not present

## 2016-03-23 MED ORDER — POTASSIUM CHLORIDE CRYS ER 20 MEQ PO TBCR: 20.0000 meq | EXTENDED_RELEASE_TABLET | Freq: Every day | ORAL | Status: DC

## 2016-03-23 MED ORDER — OXYCODONE-ACETAMINOPHEN 7.5-325 MG PO TABS: 1.0000 | ORAL_TABLET | ORAL | Status: DC | PRN

## 2016-03-23 MED ORDER — CLOTRIMAZOLE-BETAMETHASONE 1-0.05 % EX CREA: 1.0000 "application " | TOPICAL_CREAM | Freq: Two times a day (BID) | CUTANEOUS | Status: DC

## 2016-03-23 MED ORDER — GABAPENTIN 300 MG PO CAPS: 600.0000 mg | ORAL_CAPSULE | Freq: Three times a day (TID) | ORAL | Status: DC

## 2016-03-23 NOTE — Assessment & Plan Note (Signed)
Chronic leg edema he sits about 80% of the time. I'm going to increase his Lasix to 40 mg twice a day 5 since elevating his feet and his recliner. We will also add potassium to this. He will do this for about a week he will return for recheck on his legs and the rash per above.

## 2016-03-23 NOTE — Patient Instructions (Addendum)
Use lotrisone cream to rash  Increase lasix 20m to twice a day  For 1 week Take potassium with the lasix  Referral to eye doctor  F/U 2 weeks

## 2016-03-23 NOTE — Progress Notes (Signed)
Patient ID: [name], male   DOB: 02/14/1946, 70 y.o.   MRN: [id number]    Subjective:    Patient ID: [name], male    DOB: [birth year], 70 y.o.   MRN: [id number]  Patient presents for 4 month F/U and Skin Irritation  Patient here for follow-up. He was recently seen by endocrinology his diabetes is very well controlled his A1c was at 6.9%. His renal function was preserved.  His wife is noted a rash on his arm in his legs over the past month or so. She stated initially started as 2 red spots on his left leg he started scratching at the areas now it is a flaky dry crusty rash that is spreading on his legs and now he has 2 spots on his left arm  He also continues to have his chronic abdominal pain which we're treating with Percocet. His bowel movements are good with his laxative.  He's been taking all of his medications as prescribed otherwise. He is able to walk very short distances with his cane   Review Of Systems:  GEN- denies fatigue, fever, weight loss,weakness, recent illness HEENT- denies eye drainage, change in vision, nasal discharge, CVS- denies chest pain, palpitations RESP- denies SOB, cough, wheeze ABD- denies N/V, change in stools,+ chronic abd pain GU- denies dysuria, hematuria, dribbling, incontinence MSK- denies joint pain, muscle aches, injury Neuro- denies headache, dizziness, syncope, seizure activity       Objective:    BP 118/74 mmHg  Pulse 68  Temp(Src) 98.2 F (36.8 C) (Oral)  Resp 16 GEN- NAD, alert and oriented x3 HEENT- Right PERR, EOMI, non injected sclera, pink conjunctiva, MMM, oropharynx clear CVS- RRR, no murmur RESP-CTAB ABD-NABS,soft,NT,ND Skin- left arm- scaley hyperkatotic rash on left forarm patch 3x4, small patch below elbow, with cracking in skin and underyling erythema,  Left leg, large patch of similar rash with erythema and some cracking of skin , very dry skin EXT- 1+ non pitting  edema Pulses- Radial- 2+         Assessment & Plan:      Problem List Items Addressed This Visit    None    Visit Diagnoses    Dermatitis    -  Primary    ? type of dermatitis, started on leg now spreading, ? infectious, psoriatic, or just inflammatory. Leg is worsened by the chronic edema. Trial of lotrisone, return in 2 weeks for biopsy if not improved       Note: This dictation was prepared with Dragon dictation along with smaller phrase technology. Any transcriptional errors that result from this process are unintentional.

## 2016-03-23 NOTE — Assessment & Plan Note (Signed)
History of glaucoma as well as diabetes mellitus. He has not been to the eye doctor in about 2 years we'll set up by appointment

## 2016-03-29 ENCOUNTER — Ambulatory Visit (INDEPENDENT_AMBULATORY_CARE_PROVIDER_SITE_OTHER): Payer: Medicare Other | Admitting: Podiatry

## 2016-03-29 ENCOUNTER — Encounter: Payer: Self-pay | Admitting: Podiatry

## 2016-03-29 DIAGNOSIS — L03031 Cellulitis of right toe: Secondary | ICD-10-CM

## 2016-03-29 DIAGNOSIS — B351 Tinea unguium: Secondary | ICD-10-CM | POA: Diagnosis not present

## 2016-03-29 DIAGNOSIS — M79673 Pain in unspecified foot: Secondary | ICD-10-CM

## 2016-03-29 DIAGNOSIS — E1149 Type 2 diabetes mellitus with other diabetic neurological complication: Secondary | ICD-10-CM

## 2016-03-29 NOTE — Progress Notes (Signed)
Patient ID: MARCELLOUS SNARSKI, male   DOB: 1946/09/08, 70 y.o.   MRN: [id number]  Complaint:  Visit Type: Patient returns to my office for continued preventative foot care services. Complaint: Patient states" my nails have grown long and thick and become painful to walk and wear shoes" Patient has been diagnosed with DM with  neuropathy.  Post CVA with significant swelling both feet.[name] He presents for preventative foot care services. No changes to ROS.  He says he has pain on his third toe right foot for a long time and wants the toe to be looked at.  Podiatric Exam: Vascular: dorsalis pedis and posterior tibial pulses are not palpable due to swelling both feet.. Capillary return is immediate. Temperature gradient is WNL. Skin turgor WNL  Sensorium: Diminished  Semmes Weinstein monofilament test. Normal tactile sensation. Nail Exam: Pt has thick disfigured discolored nails with subungual debris noted bilateral entire nail hallux through fifth toenails.  Paronychia medial border third toe right foot.  Necrotic tissue noted medially with minimal drainage. Ulcer Exam: There is no evidence of ulcer or pre-ulcerative changes or infection. Orthopedic Exam: Muscle tone and strength are WNL. No limitations in general ROM. No crepitus or effusions noted. Foot type and digits show no abnormalities. Bony prominences are unremarkable. Skin: No Porokeratosis. No infection or ulcers  Diagnosis:  Tinea unguium, Pain in right toe, pain in left toes Paronychia third toe right foot.  Treatment & Plan Procedures and Treatment: Consent by patient was obtained for treatment procedures. The patient understood the discussion of treatment and procedures well. All questions were answered thoroughly reviewed. Debridement of mycotic and hypertrophic toenails, 1 through 5 bilateral and clearing of subungual debris. No ulceration, no infection noted. Incision and drainage third toenail right foot. Home instructions include peroxide  wash to paronychia. No cellulitis noted therefore no antibiotics prescribed.  Told them to return to the office if toe worsens. Return Visit-Office Procedure: Patient instructed to return to the office for a follow up visit 3 months for continued evaluation and treatment.   Gardiner Barefoot DPM

## 2016-04-06 ENCOUNTER — Ambulatory Visit (INDEPENDENT_AMBULATORY_CARE_PROVIDER_SITE_OTHER): Payer: Medicare Other | Admitting: Family Medicine

## 2016-04-06 VITALS — BP 126/74 | HR 78 | Temp 98.9°F | Resp 16

## 2016-04-06 DIAGNOSIS — N183 Chronic kidney disease, stage 3 unspecified: Secondary | ICD-10-CM

## 2016-04-06 DIAGNOSIS — A499 Bacterial infection, unspecified: Secondary | ICD-10-CM

## 2016-04-06 DIAGNOSIS — R600 Localized edema: Secondary | ICD-10-CM

## 2016-04-06 DIAGNOSIS — R609 Edema, unspecified: Secondary | ICD-10-CM | POA: Diagnosis not present

## 2016-04-06 DIAGNOSIS — B9689 Other specified bacterial agents as the cause of diseases classified elsewhere: Secondary | ICD-10-CM

## 2016-04-06 DIAGNOSIS — L089 Local infection of the skin and subcutaneous tissue, unspecified: Secondary | ICD-10-CM

## 2016-04-06 MED ORDER — SULFAMETHOXAZOLE-TRIMETHOPRIM 800-160 MG PO TABS
1.0000 | ORAL_TABLET | Freq: Two times a day (BID) | ORAL | Status: DC
Start: 2016-04-06 — End: 2016-06-23

## 2016-04-06 NOTE — Patient Instructions (Signed)
Kidney levels to be checked  Take antibiotics as prescribed Use heating pad to side  F/U 4 months

## 2016-04-06 NOTE — Progress Notes (Signed)
Patient ID: [name], male   DOB: December 14, 1945, 70 y.o.   MRN: [id number]   Subjective:    Patient ID: [name], male    DOB: 05-31-1946, 70 y.o.   MRN: [id number]  Patient presents for Skin Irritation and L Lumbar Back Pain Here for interim follow-up on rash and leg edema. Was seen 2 weeks ago at that time he had increased lower [place] edema. Increase his Lasix to 40 mg twice a day for 5 days with the addition of potassium. Also gave him Lotrisone cream to apply to the rash on his forearms as well as his left leg. He's had multiple skin infections and boils over the years. Unclear if this was just dermatitis versus some type of inflammatory reaction. Plan to biopsy if it has not improved.  Wife is continued him on the Lasix 40 mg twice a day and has helped his legs. Also the rash on his arm and legs are significantly improved. He does complain of some left flank pain he denies any particular injury denies any dysuria denies any hematuria no changes bowels. He denies any cough or congestion but he does feel the pain when he takes a deep breath. His wife is also noted that he has had some drainage from his toe he was seen by the podiatrist had some trimming done they advised him to use some peroxide. His blood sugars however started to spike of the past couple days.  Review Of Systems:  GEN- denies fatigue, fever, weight loss,weakness, recent illness HEENT- denies eye drainage, change in vision, nasal discharge, CVS- denies chest pain, palpitations RESP- denies SOB, cough, wheeze ABD- denies N/V, change in stools, abd pain GU- denies dysuria, hematuria, dribbling, incontinence MSK- denies joint pain, muscle aches, injury Neuro- denies headache, dizziness, syncope, seizure activity       Objective:    BP 126/74 mmHg  Pulse 78  Temp(Src) 98.9 F (37.2 C) (Oral)  Resp 16 GEN- NAD, alert and oriented x3, sitting in wheelchair  CVS- RRR, no murmur RESP-CTAB, normal  WOB ABD-NABS,soft, NT,ND, mild TTP left flank, below a boil- but no fluctuance, no erythema Skin- left arm- previous rash now hyperpigmented macule, no erythema, NT Left leg rash resolved , left foot 3rd digit, erythema with foul odored drainage from web space- no discrete ulcer, maceration between toes  EXT-  Chronic LE  edema -improved some Pulses- Radial- 2+       Assessment & Plan:      Problem List Items Addressed This Visit    Leg edema    Improved, but pt is not compliant with elevating legs, at home Which would help. Unable to keep hose on       Relevant Orders   CBC with Differential/Platelet   Basic metabolic panel   CKD (chronic kidney disease), stage III - Primary    Unable to get labs in office Will send out Need Creatine with increased dose of lasix       Relevant Orders   CBC with Differential/Platelet   Basic metabolic panel    Other Visit Diagnoses    Bacterial skin infection        Treat with bactrim, most likley MSK pain in flank to UTI symptoms, the boil is not active, chest is clear normal breathing     Relevant Medications    sulfamethoxazole-trimethoprim (BACTRIM DS,SEPTRA DS) 800-160 MG tablet       Note: This dictation was prepared with Dragon dictation along with  smaller phrase technology. Any transcriptional errors that result from this process are unintentional.

## 2016-04-07 ENCOUNTER — Encounter: Payer: Self-pay | Admitting: Family Medicine

## 2016-04-07 NOTE — Assessment & Plan Note (Signed)
Improved, but pt is not compliant with elevating legs, at home Which would help. Unable to keep hose on

## 2016-04-07 NOTE — Assessment & Plan Note (Signed)
Unable to get labs in office Will send out Need Creatine with increased dose of lasix

## 2016-04-08 LAB — BASIC METABOLIC PANEL
BUN: 20 mg/dL (ref 7–25)
CHLORIDE: 100 mmol/L (ref 98–110)
CO2: 24 mmol/L (ref 20–31)
CREATININE: 1.74 mg/dL — AB (ref 0.70–1.25)
Calcium: 10.7 mg/dL — ABNORMAL HIGH (ref 8.6–10.3)
Glucose, Bld: 117 mg/dL — ABNORMAL HIGH (ref 70–99)
POTASSIUM: 4.6 mmol/L (ref 3.5–5.3)
Sodium: 137 mmol/L (ref 135–146)

## 2016-04-08 LAB — CBC WITH DIFFERENTIAL/PLATELET
BASOS PCT: 1 %
Basophils Absolute: 83 cells/uL (ref 0–200)
EOS ABS: 1245 {cells}/uL — AB (ref 15–500)
Eosinophils Relative: 15 %
HEMATOCRIT: 39.8 % (ref 38.5–50.0)
Hemoglobin: 12.7 g/dL — ABNORMAL LOW (ref 13.0–17.0)
LYMPHS PCT: 20 %
Lymphs Abs: 1660 cells/uL (ref 850–3900)
MCH: 23.3 pg — ABNORMAL LOW (ref 27.0–33.0)
MCHC: 31.9 g/dL — ABNORMAL LOW (ref 32.0–36.0)
MCV: 72.9 fL — AB (ref 80.0–100.0)
MONO ABS: 498 {cells}/uL (ref 200–950)
MONOS PCT: 6 %
NEUTROS ABS: 4814 {cells}/uL (ref 1500–7800)
Neutrophils Relative %: 58 %
Platelets: 248 10*3/uL (ref 140–400)
RBC: 5.46 MIL/uL (ref 4.20–5.80)
RDW: 18.5 % — AB (ref 11.0–15.0)
WBC: 8.3 10*3/uL (ref 3.8–10.8)

## 2016-04-11 ENCOUNTER — Other Ambulatory Visit: Payer: Self-pay | Admitting: Family Medicine

## 2016-04-11 ENCOUNTER — Other Ambulatory Visit: Payer: Self-pay | Admitting: *Deleted

## 2016-04-11 DIAGNOSIS — N289 Disorder of kidney and ureter, unspecified: Secondary | ICD-10-CM

## 2016-04-11 NOTE — Telephone Encounter (Signed)
Refill appropriate and filled per protocol. 

## 2016-04-15 ENCOUNTER — Other Ambulatory Visit: Payer: Self-pay | Admitting: Family Medicine

## 2016-04-15 DIAGNOSIS — N289 Disorder of kidney and ureter, unspecified: Secondary | ICD-10-CM | POA: Diagnosis not present

## 2016-04-15 LAB — BASIC METABOLIC PANEL
BUN: 29 mg/dL — ABNORMAL HIGH (ref 7–25)
CALCIUM: 10.9 mg/dL — AB (ref 8.6–10.3)
CO2: 22 mmol/L (ref 20–31)
CREATININE: 1.57 mg/dL — AB (ref 0.70–1.25)
Chloride: 101 mmol/L (ref 98–110)
GLUCOSE: 79 mg/dL (ref 70–99)
Potassium: 4.7 mmol/L (ref 3.5–5.3)
Sodium: 135 mmol/L (ref 135–146)

## 2016-04-18 ENCOUNTER — Telehealth: Payer: Self-pay | Admitting: *Deleted

## 2016-04-18 DIAGNOSIS — M549 Dorsalgia, unspecified: Secondary | ICD-10-CM

## 2016-04-18 NOTE — Telephone Encounter (Signed)
Received call from patient wife in regards to lower back pain.   States that patient continues to voice C/O severe pain and is requesting order for x-ray.   MD please advise.

## 2016-04-19 NOTE — Telephone Encounter (Signed)
X- rays ordered.   Call placed to patient and patient wife made aware.

## 2016-04-19 NOTE — Telephone Encounter (Signed)
Okay to order, order L spine and T spine xray

## 2016-04-20 ENCOUNTER — Ambulatory Visit (HOSPITAL_COMMUNITY)
Admission: RE | Admit: 2016-04-20 | Discharge: 2016-04-20 | Disposition: A | Discharge status: Home / Self Care | Payer: Medicare Other | Source: Ambulatory Visit | Attending: Family Medicine | Admitting: Family Medicine

## 2016-04-20 DIAGNOSIS — M546 Pain in thoracic spine: Secondary | ICD-10-CM | POA: Diagnosis not present

## 2016-04-20 DIAGNOSIS — M549 Dorsalgia, unspecified: Secondary | ICD-10-CM | POA: Insufficient documentation

## 2016-04-20 DIAGNOSIS — M545 Low back pain: Secondary | ICD-10-CM | POA: Diagnosis not present

## 2016-04-27 ENCOUNTER — Other Ambulatory Visit: Payer: Self-pay | Admitting: Family Medicine

## 2016-04-28 ENCOUNTER — Other Ambulatory Visit: Payer: Self-pay | Admitting: Family Medicine

## 2016-04-28 NOTE — Telephone Encounter (Signed)
Refill appropriate and filled per protocol. 

## 2016-05-13 ENCOUNTER — Other Ambulatory Visit: Payer: Self-pay | Admitting: "Endocrinology

## 2016-05-17 ENCOUNTER — Other Ambulatory Visit: Payer: Self-pay | Admitting: Family Medicine

## 2016-05-17 NOTE — Telephone Encounter (Signed)
Prescription sent to pharmacy.

## 2016-05-17 NOTE — Telephone Encounter (Signed)
Ok to refill 

## 2016-05-17 NOTE — Telephone Encounter (Signed)
okay

## 2016-05-23 ENCOUNTER — Emergency Department (HOSPITAL_COMMUNITY)
Admission: EM | Admit: 2016-05-23 | Discharge: 2016-05-24 | Disposition: A | Discharge status: Home / Self Care | Payer: Medicare Other | Attending: Emergency Medicine | Admitting: Emergency Medicine

## 2016-05-23 ENCOUNTER — Emergency Department (HOSPITAL_COMMUNITY): Payer: Medicare Other

## 2016-05-23 ENCOUNTER — Encounter (HOSPITAL_COMMUNITY): Payer: Self-pay | Admitting: Emergency Medicine

## 2016-05-23 DIAGNOSIS — I10 Essential (primary) hypertension: Secondary | ICD-10-CM | POA: Insufficient documentation

## 2016-05-23 DIAGNOSIS — Z85818 Personal history of malignant neoplasm of other sites of lip, oral cavity, and pharynx: Secondary | ICD-10-CM | POA: Diagnosis not present

## 2016-05-23 DIAGNOSIS — Z87891 Personal history of nicotine dependence: Secondary | ICD-10-CM | POA: Diagnosis not present

## 2016-05-23 DIAGNOSIS — M1990 Unspecified osteoarthritis, unspecified site: Secondary | ICD-10-CM | POA: Insufficient documentation

## 2016-05-23 DIAGNOSIS — E785 Hyperlipidemia, unspecified: Secondary | ICD-10-CM | POA: Diagnosis not present

## 2016-05-23 DIAGNOSIS — Z7984 Long term (current) use of oral hypoglycemic drugs: Secondary | ICD-10-CM | POA: Insufficient documentation

## 2016-05-23 DIAGNOSIS — E119 Type 2 diabetes mellitus without complications: Secondary | ICD-10-CM | POA: Diagnosis not present

## 2016-05-23 DIAGNOSIS — J45909 Unspecified asthma, uncomplicated: Secondary | ICD-10-CM | POA: Diagnosis not present

## 2016-05-23 DIAGNOSIS — Z794 Long term (current) use of insulin: Secondary | ICD-10-CM | POA: Insufficient documentation

## 2016-05-23 DIAGNOSIS — R109 Unspecified abdominal pain: Secondary | ICD-10-CM | POA: Diagnosis not present

## 2016-05-23 DIAGNOSIS — Z8673 Personal history of transient ischemic attack (TIA), and cerebral infarction without residual deficits: Secondary | ICD-10-CM | POA: Diagnosis not present

## 2016-05-23 DIAGNOSIS — Z79891 Long term (current) use of opiate analgesic: Secondary | ICD-10-CM | POA: Insufficient documentation

## 2016-05-23 DIAGNOSIS — N390 Urinary tract infection, site not specified: Secondary | ICD-10-CM | POA: Insufficient documentation

## 2016-05-23 DIAGNOSIS — Z79899 Other long term (current) drug therapy: Secondary | ICD-10-CM | POA: Insufficient documentation

## 2016-05-23 LAB — COMPREHENSIVE METABOLIC PANEL
ALBUMIN: 4.1 g/dL (ref 3.5–5.0)
ALT: 17 U/L (ref 17–63)
ANION GAP: 10 (ref 5–15)
AST: 19 U/L (ref 15–41)
Alkaline Phosphatase: 74 U/L (ref 38–126)
BUN: 22 mg/dL — ABNORMAL HIGH (ref 6–20)
CHLORIDE: 107 mmol/L (ref 101–111)
CO2: 22 mmol/L (ref 22–32)
Calcium: 10.4 mg/dL — ABNORMAL HIGH (ref 8.9–10.3)
Creatinine, Ser: 1.51 mg/dL — ABNORMAL HIGH (ref 0.61–1.24)
GFR calc non Af Amer: 45 mL/min — ABNORMAL LOW (ref >60)
GFR, EST AFRICAN AMERICAN: 53 mL/min — AB (ref >60)
Glucose, Bld: 166 mg/dL — ABNORMAL HIGH (ref 65–99)
Potassium: 4.3 mmol/L (ref 3.5–5.1)
SODIUM: 139 mmol/L (ref 135–145)
Total Bilirubin: 0.5 mg/dL (ref 0.3–1.2)
Total Protein: 7.8 g/dL (ref 6.5–8.1)

## 2016-05-23 LAB — CBC
HEMATOCRIT: 43 % (ref 39.0–52.0)
HEMOGLOBIN: 13.7 g/dL (ref 13.0–17.0)
MCH: 23.8 pg — AB (ref 26.0–34.0)
MCHC: 31.9 g/dL (ref 30.0–36.0)
MCV: 74.7 fL — AB (ref 78.0–100.0)
Platelets: 212 10*3/uL (ref 150–400)
RBC: 5.76 MIL/uL (ref 4.22–5.81)
RDW: 17.8 % — ABNORMAL HIGH (ref 11.5–15.5)
WBC: 14.5 10*3/uL — ABNORMAL HIGH (ref 4.0–10.5)

## 2016-05-23 LAB — LIPASE, BLOOD: LIPASE: 22 U/L (ref 11–51)

## 2016-05-23 MED ORDER — DIATRIZOATE MEGLUMINE & SODIUM 66-10 % PO SOLN
ORAL | Status: AC
  Filled 2016-05-23: qty 30

## 2016-05-23 MED ORDER — SODIUM CHLORIDE 0.9 % IV BOLUS (SEPSIS)
500.0000 mL | Freq: Once | INTRAVENOUS | Status: AC
  Administered 2016-05-23: 500 mL via INTRAVENOUS

## 2016-05-23 MED ORDER — IOPAMIDOL (ISOVUE-300) INJECTION 61%
100.0000 mL | Freq: Once | INTRAVENOUS | Status: AC | PRN
  Administered 2016-05-23: 100 mL via INTRAVENOUS

## 2016-05-23 NOTE — ED Provider Notes (Signed)
CSN: [id number]     Arrival date & time 05/23/16  1841 History   By signing my name below, I, Ephriam Jenkins, attest that this documentation has been prepared under the direction and in the presence of Davonna Belling, MD. Electronically signed, Ephriam Jenkins, ED Scribe. 05/23/2016. 9:06 PM.    Chief Complaint  Patient presents with  . Fever  . Abdominal Pain   The history is provided by the patient and the spouse. No language interpreter was used.  [name] HPI Comments: WENDELL NICOSON is a 70 y.o. male with a PMHx of CVA, HTN, who presents to the Emergency Department complaining of sudden onset, constant right sided abdominal pain onset 4 hours ago. Pt's wife states they were eating dinner when he started shaking and complained of feeling cold. Pt reports associated symptoms of nausea. Pt's wife also states he has not been eating well lately. Pt denies increased hunger, vomiting, bowel or bladder incontinence.  Past Medical History  Diagnosis Date  . Barrett's esophagus     EGD 03/23/2011 & EGD 2/09 bx proven  . Steatohepatitis     liver biopsy 2009  . DM (diabetes mellitus) ([place])   . Diverticulosis     TCS 03/23/11 pancolonic diverticula &TCS 5/08, pancolonic diverticula  . GERD (gastroesophageal reflux disease)   . HTN (hypertension)   . CVA (cerebral infarction) 1998    right sided deficit  . Gout   . Lymphoma ([place]) 1974    XRT at [facility], right base of skull area  . Hepatitis     esosiniphilic, tx with prednisone  . Renal insufficiency   . 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 ([place]) 07/15/2011  . Hiatal hernia   . Colon polyp 03/23/2011    tubular adenoma, Dr. Gala Romney  . Hemorrhoids, internal 03/23/2011    tcs by Dr. Gala Romney  . Hyperlipidemia   . Hemorrhagic colitis 06/06/2012.  . Glaucoma (increased eye pressure)   . Arthritis   . Asthma     "hx of"  . Cancer of parotid gland ([place]) 11/23/12    Adenocarcinoma  . Lower facial weakness     Right  . Esotropia of left eye   .  Edema of lower extremity 12/21/12    bilateral   . BPH (benign prostatic hyperplasia)   . Hx of radiation therapy 1974    right base of skull area-lymphoma  . Stroke [facility]) 1998    right hemiparesis/plegia  . Chronic abdominal pain    Past Surgical History  Procedure Laterality Date  . Cholecystectomy    . Right lymph node removal    . Right video-assisted thoracic surgery, pleurectomy, and pleurodesis  2011  . Esophagogastroduodenoscopy  02/05/08    goblet cell metaplasia/negative for H.pylori  . Colonoscopy  03/23/11    Dr. Gala Romney  pancolonic diverticula, hemorrhoids, tubular adenoma.. next tcs 03/2016  . Esophagogastroduodenoscopy  03/23/11    Dr. Gala Romney, barretts, hiatal hernia  . Mass biopsy  11/01/2012    Procedure: NECK MASS BIOPSY;  Surgeon: Ascencion Dike, MD;  Location: [facility];  Service: ENT;  Laterality: Right;  Excisional Bx Right Neck Mass; attempted external jugular cutdown of left side  . Parotidectomy  11/24/2012    Procedure: PAROTIDECTOMY;  Surgeon: Ascencion Dike, MD;  Location: [place];  Service: ENT;  Laterality: N/A;  Total parotidectomy  . Pleurectomy     Family History  Problem Relation Age of Onset  . Heart failure Father   .  Heart failure Mother   . Heart failure Sister   . Heart failure Son    Social History  Substance Use Topics  . Smoking status: Former Smoker -- 3.00 packs/day    Types: Cigarettes    Quit date: 03/01/1997  . Smokeless tobacco: Never Used  . Alcohol Use: No    Review of Systems    Allergies  Review of patient's allergies indicates no known allergies.  Home Medications   Prior to Admission medications   Medication Sig Start Date End Date Taking? Authorizing Provider  allopurinol (ZYLOPRIM) 300 MG tablet TAKE 1 TABLET BY MOUTH EVERY DAY 04/11/16  Yes [name], MD  amLODipine (NORVASC) 10 MG tablet TAKE 1 TABLET BY MOUTH DAILY 04/11/16  Yes [name], MD  cloNIDine (CATAPRES) 0.2 MG tablet TAKE 1 TABLET BY MOUTH THREE TIMES  DAILY 04/28/16  Yes [name], MD  clotrimazole-betamethasone (LOTRISONE) cream Apply 1 application topically 2 (two) times daily. 03/23/16  Yes [name], MD  furosemide (LASIX) 40 MG tablet TAKE 1 TABLET BY MOUTH DAILY 04/11/16  Yes [name], MD  gabapentin (NEURONTIN) 300 MG capsule Take 2 capsules (600 mg total) by mouth 3 (three) times daily. 03/23/16  Yes [name], MD  Insulin Glargine (LANTUS SOLOSTAR) 100 UNIT/ML Solostar Pen Inject 40 Units into the skin at bedtime. 03/11/16  Yes Cassandria Anger, MD  insulin lispro (HUMALOG KWIKPEN) 100 UNIT/ML KiwkPen Inject 0.05-0.11 mLs (5-11 Units total) into the skin 3 (three) times daily with meals. 03/11/16  Yes Cassandria Anger, MD  levothyroxine (SYNTHROID, LEVOTHROID) 50 MCG tablet TAKE 1 TABLET BY MOUTH EVERY MORNING 02/29/16  Yes Cassandria Anger, MD  linagliptin (TRADJENTA) 5 MG TABS tablet Take 1 tablet by mouth  daily 09/09/15  Yes Cassandria Anger, MD  lisinopril (PRINIVIL,ZESTRIL) 20 MG tablet TAKE 1 TABLET BY MOUTH DAILY 11/04/15  Yes [name], MD  metFORMIN (GLUCOPHAGE) 500 MG tablet TAKE 1 TABLET BY MOUTH TWICE DAILY 05/13/16  Yes Cassandria Anger, MD  metoprolol succinate (TOPROL-XL) 50 MG 24 hr tablet TAKE 1 TABLET BY MOUTH DAILY WITH OR IMMEDIATELY FOLLOWING A MEAL 03/11/16  Yes [name], MD  oxyCODONE-acetaminophen (PERCOCET) 7.5-325 MG tablet Take 1 tablet by mouth every 4 (four) hours as needed. Patient taking differently: Take 1 tablet by mouth every 4 (four) hours as needed for moderate pain.  03/23/16  Yes [name], MD  pantoprazole (PROTONIX) 40 MG tablet TAKE 1 TABLET BY MOUTH DAILY 04/11/16  Yes [name], MD  potassium chloride SA (K-DUR,KLOR-CON) 20 MEQ tablet Take 1 tablet (20 mEq total) by mouth daily. 03/23/16  Yes [name], MD  simvastatin (ZOCOR) 20 MG tablet TAKE 1 TABLET BY MOUTH EVERY NIGHT AT BEDTIME 04/28/16  Yes [name], MD   tamsulosin (FLOMAX) 0.4 MG CAPS capsule TAKE 1 CAPSULE BY MOUTH DAILY 08/06/15  Yes [name], MD  tiZANidine (ZANAFLEX) 2 MG tablet TAKE 1 TABLET BY MOUTH TWICE DAILY AS NEEDED Patient taking differently: TAKE 1 TABLET BY MOUTH TWICE DAILY AS NEEDED FOR MUSCLE SPASMS 05/17/16  Yes [name], MD  cephALEXin (KEFLEX) 500 MG capsule Take 1 capsule (500 mg total) by mouth 4 (four) times daily. 05/24/16   Davonna Belling, MD  sulfamethoxazole-trimethoprim (BACTRIM DS,SEPTRA DS) 800-160 MG tablet Take 1 tablet by mouth 2 (two) times daily. Patient not taking: Reported on 05/23/2016 04/06/16   [name], MD  BP 142/93 mmHg  Pulse 115  Temp(Src) 99.7 F (37.6 C) (Rectal)  Resp 17  Ht 6' (1.829 m)  Wt 260 lb (117.935 kg)  BMI 35.25 kg/m2  SpO2 98% Physical Exam  Pulmonary/Chest: Breath sounds normal.  Abdominal: He exhibits distension. There is tenderness.  Abdomen mildly distended  Tender right mid abdomen No hernias palpated   Musculoskeletal: He exhibits edema.  Moderate edema on both legs  Neurological:  Left sided facial droop Chronic decreased movement of left eye     ED Course  Procedures   COORDINATION OF CARE: 9:03 PM-Will order blood work. Discussed treatment plan with pt at bedside and pt agreed to plan.   Labs Review Labs Reviewed  COMPREHENSIVE METABOLIC PANEL - Abnormal; Notable for the following:    Glucose, Bld 166 (*)    BUN 22 (*)    Creatinine, Ser 1.51 (*)    Calcium 10.4 (*)    GFR calc non Af Amer 45 (*)    GFR calc Af Amer 53 (*)    All other components within normal limits  CBC - Abnormal; Notable for the following:    WBC 14.5 (*)    MCV 74.7 (*)    MCH 23.8 (*)    RDW 17.8 (*)    All other components within normal limits  URINALYSIS, ROUTINE W REFLEX MICROSCOPIC (NOT AT [facility]) - Abnormal; Notable for the following:    Color, Urine STRAW (*)    Specific Gravity, Urine <1.005 (*)    Glucose, UA 100 (*)    Hgb urine dipstick  SMALL (*)    Protein, ur 100 (*)    Leukocytes, UA TRACE (*)    All other components within normal limits  URINE MICROSCOPIC-ADD ON - Abnormal; Notable for the following:    Squamous Epithelial / LPF 0-5 (*)    Bacteria, UA MANY (*)    All other components within normal limits  URINE CULTURE  LIPASE, BLOOD    Imaging Review Ct Abdomen Pelvis W Contrast  05/23/2016  CLINICAL DATA:  Patient developed right-sided abdominal pain and chills while eating dinner. Nausea. History of diabetes. EXAM: CT ABDOMEN AND PELVIS WITH CONTRAST TECHNIQUE: Multidetector CT imaging of the abdomen and pelvis was performed using the standard protocol following bolus administration of intravenous contrast. CONTRAST:  111m ISOVUE-300 IOPAMIDOL (ISOVUE-300) INJECTION 61% COMPARISON:  06/20/2014 FINDINGS: Interstitial fibrosis and scattered emphysematous changes in the lung bases. Small esophageal hiatal hernia. Coronary artery calcifications. Surgical absence of the gallbladder. No bile duct dilatation. Bilateral renal cysts are similar to prior study. No hydronephrosis. The liver, spleen, pancreas, adrenal glands, abdominal aorta, inferior vena cava, and retroperitoneal lymph nodes are unremarkable. Stomach, small bowel, and colon are not abnormally distended. No free air or free fluid in the abdomen. Ventral periumbilical abdominal wall hernias containing fat. No bowel herniation. Pelvis: Prostate gland is enlarged, measuring 5.5 cm diameter. Bladder wall is not thickened. No free or loculated pelvic fluid collections. No pelvic mass or lymphadenopathy. Diverticulosis of the sigmoid colon without evidence of diverticulitis. The appendix is normal. Mixed lytic and sclerotic bone lesion within the L3 vertebral body with Schmorl's node at the inferior endplate. Appearance is unchanged since previous study. Mild degenerative changes in the spine. IMPRESSION: No acute abnormality demonstrated within the abdomen or pelvis.  Periumbilical abdominal wall hernia containing fat. Prostate gland enlargement. Unchanged appearance of vague mixed lytic and sclerotic lesion in the L3 vertebra. Electronically Signed   By: WOren BeckmannD.  On: 05/23/2016 23:38   I have personally reviewed and evaluated these images and lab results as part of my medical decision-making.   EKG Interpretation None      MDM   Final diagnoses:  Lower urinary tract infection    Patient presented with abdominal pain and low fever. Feeling cold. Feels better after treatment. Tolerated orals. CT scan reassuring. Fevers resolved. Patient is eager to go home. Urine culture sent. Follow with PCP in 1-2 days.  I personally performed the services described in this documentation, which was scribed in my presence. The recorded information has been reviewed and is accurate.      Davonna Belling, MD 05/24/16 [phone]

## 2016-05-23 NOTE — ED Notes (Signed)
Pt states he felt fine earlier and then while eating dinner developed right sided abd pain and chills.

## 2016-05-24 LAB — URINE MICROSCOPIC-ADD ON

## 2016-05-24 LAB — URINALYSIS, ROUTINE W REFLEX MICROSCOPIC
BILIRUBIN URINE: NEGATIVE
Glucose, UA: 100 mg/dL — AB
KETONES UR: NEGATIVE mg/dL
NITRITE: NEGATIVE
PH: 6 (ref 5.0–8.0)
PROTEIN: 100 mg/dL — AB
Specific Gravity, Urine: <1.005 — ABNORMAL LOW (ref 1.005–1.030)

## 2016-05-24 MED ORDER — CEPHALEXIN 500 MG PO CAPS: 500.0000 mg | ORAL_CAPSULE | Freq: Four times a day (QID) | ORAL | Status: DC

## 2016-05-24 MED ORDER — SODIUM CHLORIDE 0.9 % IV BOLUS (SEPSIS)
500.0000 mL | Freq: Once | INTRAVENOUS | Status: AC
  Administered 2016-05-24: via INTRAVENOUS

## 2016-05-24 NOTE — Discharge Instructions (Signed)

## 2016-05-26 LAB — URINE CULTURE: Culture: >=100000 — AB

## 2016-05-27 ENCOUNTER — Telehealth (HOSPITAL_BASED_OUTPATIENT_CLINIC_OR_DEPARTMENT_OTHER): Payer: Self-pay

## 2016-05-27 ENCOUNTER — Other Ambulatory Visit: Payer: Self-pay | Admitting: "Endocrinology

## 2016-05-27 NOTE — Telephone Encounter (Signed)
Post ED Visit - Positive Culture Follow-up  Culture report reviewed by antimicrobial stewardship pharmacist:  []  Elenor Quinones, Pharm.D. []  Heide Guile, Pharm.D., BCPS []  Parks Neptune, Pharm.D. []  [name], Pharm.D., BCPS [x]  B[place]aine Neighbors, [place].D., BCPS, AAHIVP []  Legrand Como, Pharm.D., BCPS, AAHIVP []  Milus Glazier, Pharm.D. []  Stephens November, Pharm.D.  Positive urine culture Treated with Cephalexin, organism sensitive to the same and no further patient follow-up is required at this time.  Genia Del 05/27/2016, 10:30 AM

## 2016-06-07 ENCOUNTER — Other Ambulatory Visit: Payer: Self-pay | Admitting: Family Medicine

## 2016-06-17 ENCOUNTER — Other Ambulatory Visit: Payer: Self-pay | Admitting: "Endocrinology

## 2016-06-17 DIAGNOSIS — N182 Chronic kidney disease, stage 2 (mild): Secondary | ICD-10-CM | POA: Diagnosis not present

## 2016-06-17 DIAGNOSIS — Z794 Long term (current) use of insulin: Secondary | ICD-10-CM | POA: Diagnosis not present

## 2016-06-17 DIAGNOSIS — E1122 Type 2 diabetes mellitus with diabetic chronic kidney disease: Secondary | ICD-10-CM | POA: Diagnosis not present

## 2016-06-17 LAB — HEMOGLOBIN A1C
HEMOGLOBIN A1C: 7.2 % — AB (ref <5.7)
Mean Plasma Glucose: 160 mg/dL

## 2016-06-17 LAB — BASIC METABOLIC PANEL
BUN: 14 mg/dL (ref 7–25)
CALCIUM: 9.9 mg/dL (ref 8.6–10.3)
CO2: 24 mmol/L (ref 20–31)
Chloride: 107 mmol/L (ref 98–110)
Creat: 1.14 mg/dL (ref 0.70–1.25)
Glucose, Bld: 105 mg/dL — ABNORMAL HIGH (ref 65–99)
Potassium: 4.6 mmol/L (ref 3.5–5.3)
SODIUM: 138 mmol/L (ref 135–146)

## 2016-06-23 ENCOUNTER — Encounter: Payer: Self-pay | Admitting: "Endocrinology

## 2016-06-23 ENCOUNTER — Ambulatory Visit (INDEPENDENT_AMBULATORY_CARE_PROVIDER_SITE_OTHER): Payer: Medicare Other | Admitting: "Endocrinology

## 2016-06-23 VITALS — BP 103/72 | HR 77 | Ht 70.0 in

## 2016-06-23 DIAGNOSIS — E1122 Type 2 diabetes mellitus with diabetic chronic kidney disease: Secondary | ICD-10-CM | POA: Diagnosis not present

## 2016-06-23 DIAGNOSIS — E039 Hypothyroidism, unspecified: Secondary | ICD-10-CM | POA: Diagnosis not present

## 2016-06-23 DIAGNOSIS — N182 Chronic kidney disease, stage 2 (mild): Secondary | ICD-10-CM

## 2016-06-23 DIAGNOSIS — E785 Hyperlipidemia, unspecified: Secondary | ICD-10-CM | POA: Diagnosis not present

## 2016-06-23 DIAGNOSIS — Z794 Long term (current) use of insulin: Secondary | ICD-10-CM | POA: Diagnosis not present

## 2016-06-23 DIAGNOSIS — I10 Essential (primary) hypertension: Secondary | ICD-10-CM | POA: Diagnosis not present

## 2016-06-23 NOTE — Patient Instructions (Signed)

## 2016-06-23 NOTE — Progress Notes (Signed)
Subjective:    Patient ID: [name], male    DOB: 1946/06/19,    Past Medical History  Diagnosis Date  . Barrett's esophagus     EGD 03/23/2011 & EGD 2/09 bx proven  . Steatohepatitis     liver biopsy 2009  . DM (diabetes mellitus) ([place])   . Diverticulosis     TCS 03/23/11 pancolonic diverticula &TCS 5/08, pancolonic diverticula  . GERD (gastroesophageal reflux disease)   . HTN (hypertension)   . CVA (cerebral infarction) 1998    right sided deficit  . Gout   . Lymphoma ([place]) 1974    XRT at [facility], right base of skull area  . Hepatitis     esosiniphilic, tx with prednisone  . Renal insufficiency   . 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 ([place]) 07/15/2011  . Hiatal hernia   . Colon polyp 03/23/2011    tubular adenoma, Dr. Gala Romney  . Hemorrhoids, internal 03/23/2011    tcs by Dr. Gala Romney  . Hyperlipidemia   . Hemorrhagic colitis 06/06/2012.  . Glaucoma (increased eye pressure)   . Arthritis   . Asthma     "hx of"  . Cancer of parotid gland ([place]) 11/23/12    Adenocarcinoma  . Lower facial weakness     Right  . Esotropia of left eye   . Edema of lower extremity 12/21/12    bilateral   . BPH (benign prostatic hyperplasia)   . Hx of radiation therapy 1974    right base of skull area-lymphoma  . Stroke [facility]) 1998    right hemiparesis/plegia  . Chronic abdominal pain    Past Surgical History  Procedure Laterality Date  . Cholecystectomy    . Right lymph node removal    . Right video-assisted thoracic surgery, pleurectomy, and pleurodesis  2011  . Esophagogastroduodenoscopy  02/05/08    goblet cell metaplasia/negative for H.pylori  . Colonoscopy  03/23/11    Dr. Gala Romney  pancolonic diverticula, hemorrhoids, tubular adenoma.. next tcs 03/2016  . Esophagogastroduodenoscopy  03/23/11    Dr. Gala Romney, barretts, hiatal hernia  . Mass biopsy  11/01/2012    Procedure: NECK MASS BIOPSY;  Surgeon: Ascencion Dike, MD;  Location: [facility];  Service: ENT;  Laterality: Right;   Excisional Bx Right Neck Mass; attempted external jugular cutdown of left side  . Parotidectomy  11/24/2012    Procedure: PAROTIDECTOMY;  Surgeon: Ascencion Dike, MD;  Location: [place];  Service: ENT;  Laterality: N/A;  Total parotidectomy  . Pleurectomy     Social History   Social History  . Marital Status: Married    Spouse Name: N/A  . Number of Children: 1  . Years of Education: N/A   Occupational History  . disabled    Social History Main Topics  . Smoking status: Former Smoker -- 3.00 packs/day    Types: Cigarettes    Quit date: 03/01/1997  . Smokeless tobacco: Never Used  . Alcohol Use: No  . Drug Use: No  . Sexual Activity: No   Other Topics Concern  . None   Social History Narrative   Outpatient Encounter Prescriptions as of 06/23/2016  Medication Sig  . Insulin Glargine (LANTUS SOLOSTAR [place]) Inject 50 Units into the skin at bedtime.  [name] allopurinol (ZYLOPRIM) 300 MG tablet TAKE 1 TABLET BY MOUTH EVERY DAY  . amLODipine (NORVASC) 10 MG tablet TAKE 1 TABLET BY MOUTH DAILY  . cephALEXin (KEFLEX) 500 MG capsule Take 1 capsule (  500 mg total) by mouth 4 (four) times daily.  . cloNIDine (CATAPRES) 0.2 MG tablet TAKE 1 TABLET BY MOUTH THREE TIMES DAILY  . clotrimazole-betamethasone (LOTRISONE) cream Apply 1 application topically 2 (two) times daily.  . furosemide (LASIX) 40 MG tablet TAKE 1 TABLET BY MOUTH DAILY  . gabapentin (NEURONTIN) 300 MG capsule Take 2 capsules (600 mg total) by mouth 3 (three) times daily.  . insulin lispro (HUMALOG KWIKPEN) 100 UNIT/ML KiwkPen Inject 0.05-0.11 mLs (5-11 Units total) into the skin 3 (three) times daily with meals.  [name] levothyroxine (SYNTHROID, LEVOTHROID) 50 MCG tablet TAKE 1 TABLET BY MOUTH EVERY MORNING  . linagliptin (TRADJENTA) 5 MG TABS tablet Take 1 tablet by mouth  daily  . lisinopril (PRINIVIL,ZESTRIL) 20 MG tablet TAKE 1 TABLET BY MOUTH DAILY  . metFORMIN (GLUCOPHAGE) 500 MG tablet TAKE 1 TABLET BY MOUTH TWICE DAILY  .  metoprolol succinate (TOPROL-XL) 50 MG 24 hr tablet TAKE 1 TABLET BY MOUTH DAILY WITH OR IMMEDIATELY FOLLOWING A MEAL  . oxyCODONE-acetaminophen (PERCOCET) 7.5-325 MG tablet Take 1 tablet by mouth every 4 (four) hours as needed. (Patient taking differently: Take 1 tablet by mouth every 4 (four) hours as needed for moderate pain. )  . pantoprazole (PROTONIX) 40 MG tablet TAKE 1 TABLET BY MOUTH DAILY  . potassium chloride SA (K-DUR,KLOR-CON) 20 MEQ tablet Take 1 tablet (20 mEq total) by mouth daily.  . simvastatin (ZOCOR) 20 MG tablet TAKE 1 TABLET BY MOUTH EVERY NIGHT AT BEDTIME  . tamsulosin (FLOMAX) 0.4 MG CAPS capsule TAKE 1 CAPSULE BY MOUTH DAILY  . tiZANidine (ZANAFLEX) 2 MG tablet TAKE 1 TABLET BY MOUTH TWICE DAILY AS NEEDED (Patient taking differently: TAKE 1 TABLET BY MOUTH TWICE DAILY AS NEEDED FOR MUSCLE SPASMS)  . [DISCONTINUED] Insulin Glargine (LANTUS SOLOSTAR) 100 UNIT/ML Solostar Pen Inject 40 Units into the skin at bedtime.  . [DISCONTINUED] sulfamethoxazole-trimethoprim (BACTRIM DS,SEPTRA DS) 800-160 MG tablet Take 1 tablet by mouth 2 (two) times daily. (Patient not taking: Reported on 05/23/2016)   No facility-administered encounter medications on file as of 06/23/2016.   ALLERGIES: No Known Allergies VACCINATION STATUS: Immunization History  Administered Date(s) Administered  . Influenza Split 08/24/2012  . Influenza,inj,Quad PF,36+ Mos 10/09/2013, 09/05/2014, 11/23/2015  . Pneumococcal Conjugate-13 01/07/2014  . Pneumococcal Polysaccharide-23 11/25/2012    Diabetes He presents for his follow-up diabetic visit. He has type 2 diabetes mellitus. Onset time: diagnosed approx at age 70. There are no hypoglycemic associated symptoms. Pertinent negatives for hypoglycemia include no confusion, headaches, pallor or seizures. Associated symptoms include weakness. Pertinent negatives for diabetes include no chest pain, no fatigue, no polydipsia, no polyphagia and no polyuria. Symptoms  are stable. Diabetic complications include a CVA. He never participates in exercise. His breakfast blood glucose range is generally 140-180 mg/dl. His overall blood glucose range is 140-180 mg/dl. An ACE inhibitor/angiotensin II receptor blocker is being taken.  Hyperlipidemia Pertinent negatives include no chest pain, myalgias or shortness of breath.  Hypertension Pertinent negatives include no chest pain, headaches, neck pain, palpitations or shortness of breath. Hypertensive end-organ damage includes CVA.     Review of Systems  Constitutional: Negative for fever, chills, fatigue and unexpected weight change.  HENT: Negative for dental problem, mouth sores and trouble swallowing.   Eyes: Negative for visual disturbance.  Respiratory: Negative for cough, choking, chest tightness, shortness of breath and wheezing.   Cardiovascular: Negative for chest pain, palpitations and leg swelling.  Gastrointestinal: Negative for nausea, vomiting, abdominal pain, diarrhea, constipation and  abdominal distention.  Endocrine: Negative for polydipsia, polyphagia and polyuria.  Genitourinary: Negative for dysuria, urgency, hematuria and flank pain.  Musculoskeletal: Negative for myalgias, back pain, joint swelling, gait problem and neck pain.  Skin: Negative for pallor, rash and wound.  Neurological: Positive for weakness. Negative for seizures, numbness and headaches.  Psychiatric/Behavioral: Negative for suicidal ideas, hallucinations, confusion and dysphoric mood.    Objective:    BP 103/72 mmHg  Pulse 77  Ht 5' 10"  (1.778 m)  Wt Readings from Last 3 Encounters:  05/23/16 260 lb (117.935 kg)  06/26/15 265 lb (120.203 kg)  06/20/14 265 lb (120.203 kg)    Physical Exam  Constitutional: He is oriented to person, place, and time. He appears well-developed and well-nourished. He is cooperative.  HENT:  Head: Normocephalic and atraumatic.  Eyes: EOM are normal.  Neck: Normal range of motion. Neck  supple. No tracheal deviation present. No thyromegaly present.  Cardiovascular: Normal rate and normal heart sounds.  Exam reveals no gallop.   No murmur heard. Pulses:      Dorsalis pedis pulses are 1+ on the right side.       Posterior tibial pulses are 1+ on the right side.  Pulmonary/Chest: Breath sounds normal. No respiratory distress. He has no wheezes.  Abdominal: Soft. Bowel sounds are normal. He exhibits no distension.  Musculoskeletal: He exhibits no edema.  Neurological: He is alert and oriented to person, place, and time. A sensory deficit is present. No cranial nerve deficit. He exhibits normal muscle tone. Gait normal.  Pt is wheelchair bound due to past recurrent CVA  Skin: Skin is warm and dry. No rash noted. No cyanosis. Nails show no clubbing.  Psychiatric: He has a normal mood and affect. His speech is normal and behavior is normal. Judgment and thought content normal. Cognition and memory are normal.    CMP  CMP Latest Ref Rng 06/17/2016 05/23/2016 04/15/2016  Glucose 65 - 99 mg/dL 105(H) 166(H) 79  BUN 7 - 25 mg/dL 14 22(H) 29(H)  Creatinine 0.70 - 1.25 mg/dL 1.14 1.51(H) 1.57(H)  Sodium 135 - 146 mmol/L 138 139 135  Potassium 3.5 - 5.3 mmol/L 4.6 4.3 4.7  Chloride 98 - 110 mmol/L 107 107 101  CO2 20 - 31 mmol/L 24 22 22   Calcium 8.6 - 10.3 mg/dL 9.9 10.4(H) 10.9(H)  Total Protein 6.5 - 8.1 g/dL - 7.8 -  Total Bilirubin 0.3 - 1.2 mg/dL - 0.5 -  Alkaline Phos 38 - 126 U/L - 74 -  AST 15 - 41 U/L - 19 -  ALT 17 - 63 U/L - 17 -    Diabetic Labs (most recent): Lab Results  Component Value Date   HGBA1C 7.2* 06/17/2016   HGBA1C 6.9* 03/04/2016   HGBA1C 6.7 12/03/2015   Lipid Panel     Component Value Date/Time   CHOL 146 12/03/2015 0821   TRIG 180* 12/03/2015 0821   HDL 50 12/03/2015 0821   CHOLHDL 2.9 12/03/2015 0821   VLDL 36* 12/03/2015 0821   LDLCALC 60 12/03/2015 0821     Assessment & Plan:   1. Type 2 diabetes mellitus with stage 2 chronic kidney  disease and CVA - patient remains at a high risk for more acute and chronic complications of diabetes which include CAD, CVA, CKD, retinopathy, and neuropathy. These are all discussed in detail with the patient.  Patient came with controlled glucose profile, and  recent A1c of 6.7 %.  Glucose logs and insulin administration records  pertaining to this visit,  to be scanned into patient's records.  Recent labs reviewed.   - I have re-counseled the patient on diet management and weight loss  by adopting a carbohydrate restricted / protein rich  Diet.  - Suggestion is made for patient to avoid simple carbohydrates   from their diet including Cakes , Desserts, Ice Cream,  Soda (  diet and regular) , Sweet Tea , Candies,  Chips, Cookies, Artificial Sweeteners,   and "Sugar-free" Products .  This will help patient to have stable blood glucose profile and potentially avoid unintended  Weight gain.  - Patient is advised to stick to a routine mealtimes to eat 3 meals  a day and avoid unnecessary snacks ( to snack only to correct hypoglycemia).    - I have approached patient with the following individualized plan to manage diabetes and patient agrees.  - I will increase basal insulin Lantus to 50 units QHS, and  Continue  Humalog  5 units TIDAC for pre-meal BG readings of 90-163m/dl, plus patient specific correction dose of rapid acting insulin  for unexpected hyperglycemia above 1563mdl, associated with strict monitoring of glucose  AC and HS. - Patient is warned not to take insulin without proper monitoring per orders. -Adjustment parameters are given for hypo and hyperglycemia in writing. -Patient is encouraged to call clinic for blood glucose levels less than 70 or above 300 mg /dl. - I will continue  Metformin 50089mo BID, Tradjenta 5 mg by mouth daily, therapeutically suitable for patient. -Patient is not a candidate for metformin andSGLT2 inhibitors due to CKD.  - Patient specific target  for  A1c; LDL, HDL, Triglycerides, and  Waist Circumference were discussed in detail.  2) BP/HTN: Controlled. Continue current medications including ACEI/ARB. 3) Lipids/HPL:  continue statins. 4)  Weight/Diet: He is limited on how much she can exercise and carbohydrates information provided.  5) hypothyroidism: He is euthyroid on levothyroxine 50 g by mouth every morning.  - We discussed about correct intake of levothyroxine, at fasting, with water, separated by at least 30 minutes from breakfast, and separated by more than 4 hours from calcium, iron, multivitamins, acid reflux medications (PPIs). -Patient is made aware of the fact that thyroid hormone replacement is needed for life, dose to be adjusted by periodic monitoring of thyroid function tests.  6) Chronic Care/Health Maintenance:  -Patient is on ACEI/ARB and Statin medications and encouraged to continue to follow up with Ophthalmology, Podiatrist at least yearly or according to recommendations, and advised to  stay away from smoking. I have recommended yearly flu vaccine and pneumonia vaccination at least every 5 years; moderate intensity exercise for up to 150 minutes weekly; and  sleep for at least 7 hours a day.  - 25 minutes of time was spent on the care of this patient , 50% of which was applied for counseling on diabetes complications and their preventions.  - I advised patient to maintain close follow up with Dr. DurBuelah Manisr primary care needs.  Patient is asked to bring meter and  blood glucose logs during their next visit.   Follow up plan: Return for meter, and logs.  GebGlade LloydD Phone: [phone]: [phone]/13/2017, 11:18 AM

## 2016-06-28 ENCOUNTER — Ambulatory Visit (INDEPENDENT_AMBULATORY_CARE_PROVIDER_SITE_OTHER): Payer: Medicare Other | Admitting: Podiatry

## 2016-06-28 ENCOUNTER — Encounter: Payer: Self-pay | Admitting: Podiatry

## 2016-06-28 DIAGNOSIS — B351 Tinea unguium: Secondary | ICD-10-CM | POA: Diagnosis not present

## 2016-06-28 DIAGNOSIS — M79673 Pain in unspecified foot: Secondary | ICD-10-CM

## 2016-06-28 DIAGNOSIS — E1149 Type 2 diabetes mellitus with other diabetic neurological complication: Secondary | ICD-10-CM | POA: Diagnosis not present

## 2016-06-28 NOTE — Progress Notes (Signed)
Patient ID: [name], male   DOB: May 30, 1946, 70 y.o.   MRN: [id number]  Complaint:  Visit Type: Patient returns to my office for continued preventative foot care services. Complaint: Patient states" my nails have grown long and thick and become painful to walk and wear shoes" Patient has been diagnosed with DM with  neuropathy.  Post CVA with significant swelling both feet.[name] He presents for preventative foot care services. No changes to ROS.  He says he has pain on his third toe right foot for a long time and wants the toe to be looked at.  Podiatric Exam: Vascular: dorsalis pedis and posterior tibial pulses are not palpable due to swelling both feet.. Capillary return is immediate. Temperature gradient is WNL. Skin turgor WNL  Sensorium: Diminished  Semmes Weinstein monofilament test. Normal tactile sensation. Nail Exam: Pt has thick disfigured discolored nails with subungual debris noted bilateral entire nail hallux through fifth toenails.  Ulcer Exam: There is no evidence of ulcer or pre-ulcerative changes or infection. Orthopedic Exam: Muscle tone and strength are WNL. No limitations in general ROM. No crepitus or effusions noted. Foot type and digits show no abnormalities. Bony prominences are unremarkable. Skin: No Porokeratosis. No infection or ulcers  Diagnosis:  Tinea unguium, Pain in right toe, pain in left toes Paronychia third toe right foot.  Treatment & Plan Procedures and Treatment: Consent by patient was obtained for treatment procedures. The patient understood the discussion of treatment and procedures well. All questions were answered thoroughly reviewed. Debridement of mycotic and hypertrophic toenails, 1 through 5 bilateral and clearing of subungual debris. No ulceration, no infection noted.  Return Visit-Office Procedure: Patient instructed to return to the office for a follow up visit 3 months for continued evaluation and treatment.   Gardiner Barefoot DPM

## 2016-06-30 ENCOUNTER — Other Ambulatory Visit: Payer: Self-pay | Admitting: Family Medicine

## 2016-07-04 ENCOUNTER — Other Ambulatory Visit: Payer: Self-pay | Admitting: Family Medicine

## 2016-07-04 NOTE — Telephone Encounter (Signed)
Refill appropriate and filled per protocol. 

## 2016-07-09 ENCOUNTER — Other Ambulatory Visit: Payer: Self-pay | Admitting: Family Medicine

## 2016-07-11 NOTE — Telephone Encounter (Signed)
Refill appropriate and filled per protocol. 

## 2016-07-18 ENCOUNTER — Other Ambulatory Visit: Payer: Self-pay | Admitting: "Endocrinology

## 2016-07-25 ENCOUNTER — Other Ambulatory Visit: Payer: Self-pay | Admitting: Family Medicine

## 2016-07-26 NOTE — Telephone Encounter (Signed)
Refill appropriate and filled per protocol. 

## 2016-08-03 ENCOUNTER — Other Ambulatory Visit: Payer: Self-pay | Admitting: Family Medicine

## 2016-08-03 NOTE — Telephone Encounter (Signed)
Refill appropriate and filled per protocol. 

## 2016-08-09 ENCOUNTER — Other Ambulatory Visit: Payer: Self-pay | Admitting: "Endocrinology

## 2016-08-09 DIAGNOSIS — N182 Chronic kidney disease, stage 2 (mild): Secondary | ICD-10-CM | POA: Diagnosis not present

## 2016-08-09 DIAGNOSIS — E1122 Type 2 diabetes mellitus with diabetic chronic kidney disease: Secondary | ICD-10-CM | POA: Diagnosis not present

## 2016-08-09 DIAGNOSIS — Z794 Long term (current) use of insulin: Secondary | ICD-10-CM | POA: Diagnosis not present

## 2016-08-09 DIAGNOSIS — E039 Hypothyroidism, unspecified: Secondary | ICD-10-CM | POA: Diagnosis not present

## 2016-08-09 LAB — COMPLETE METABOLIC PANEL WITH GFR
ALBUMIN: 3.8 g/dL (ref 3.6–5.1)
ALK PHOS: 76 U/L (ref 40–115)
ALT: 14 U/L (ref 9–46)
AST: 15 U/L (ref 10–35)
BUN: 19 mg/dL (ref 7–25)
CO2: 23 mmol/L (ref 20–31)
Calcium: 10.6 mg/dL — ABNORMAL HIGH (ref 8.6–10.3)
Chloride: 104 mmol/L (ref 98–110)
Creat: 1.35 mg/dL — ABNORMAL HIGH (ref 0.70–1.25)
GFR, EST AFRICAN AMERICAN: 61 mL/min (ref >=60)
GFR, EST NON AFRICAN AMERICAN: 53 mL/min — AB (ref >=60)
Glucose, Bld: 110 mg/dL — ABNORMAL HIGH (ref 65–99)
Potassium: 4.5 mmol/L (ref 3.5–5.3)
Sodium: 138 mmol/L (ref 135–146)
Total Bilirubin: 0.3 mg/dL (ref 0.2–1.2)
Total Protein: 6.9 g/dL (ref 6.1–8.1)

## 2016-08-09 LAB — T4, FREE: FREE T4: 1.3 ng/dL (ref 0.8–1.8)

## 2016-08-09 LAB — HEMOGLOBIN A1C
HEMOGLOBIN A1C: 6.8 % — AB (ref <5.7)
Mean Plasma Glucose: 148 mg/dL

## 2016-08-09 LAB — TSH: TSH: 2.59 m[IU]/L (ref 0.40–4.50)

## 2016-08-10 ENCOUNTER — Other Ambulatory Visit: Payer: Self-pay | Admitting: Family Medicine

## 2016-08-16 ENCOUNTER — Encounter: Payer: Self-pay | Admitting: Family Medicine

## 2016-08-16 ENCOUNTER — Ambulatory Visit (INDEPENDENT_AMBULATORY_CARE_PROVIDER_SITE_OTHER): Payer: Medicare Other | Admitting: Family Medicine

## 2016-08-16 VITALS — BP 132/72 | HR 88 | Temp 98.6°F | Resp 18

## 2016-08-16 DIAGNOSIS — L723 Sebaceous cyst: Secondary | ICD-10-CM

## 2016-08-16 DIAGNOSIS — N182 Chronic kidney disease, stage 2 (mild): Secondary | ICD-10-CM | POA: Diagnosis not present

## 2016-08-16 DIAGNOSIS — R109 Unspecified abdominal pain: Secondary | ICD-10-CM

## 2016-08-16 DIAGNOSIS — I10 Essential (primary) hypertension: Secondary | ICD-10-CM

## 2016-08-16 DIAGNOSIS — G8929 Other chronic pain: Secondary | ICD-10-CM

## 2016-08-16 DIAGNOSIS — Z23 Encounter for immunization: Secondary | ICD-10-CM | POA: Diagnosis not present

## 2016-08-16 DIAGNOSIS — E1122 Type 2 diabetes mellitus with diabetic chronic kidney disease: Secondary | ICD-10-CM

## 2016-08-16 DIAGNOSIS — L0293 Carbuncle, unspecified: Secondary | ICD-10-CM | POA: Diagnosis not present

## 2016-08-16 DIAGNOSIS — Z794 Long term (current) use of insulin: Secondary | ICD-10-CM | POA: Diagnosis not present

## 2016-08-16 DIAGNOSIS — N183 Chronic kidney disease, stage 3 unspecified: Secondary | ICD-10-CM

## 2016-08-16 DIAGNOSIS — L0292 Furuncle, unspecified: Secondary | ICD-10-CM

## 2016-08-16 MED ORDER — SULFAMETHOXAZOLE-TRIMETHOPRIM 800-160 MG PO TABS
1.0000 | ORAL_TABLET | Freq: Two times a day (BID) | ORAL | 0 refills | Status: DC
Start: 2016-08-16 — End: 2016-11-09

## 2016-08-16 MED ORDER — OXYCODONE-ACETAMINOPHEN 7.5-325 MG PO TABS: 1.0000 | ORAL_TABLET | ORAL | 0 refills | Status: DC | PRN

## 2016-08-16 MED ORDER — CLOTRIMAZOLE-BETAMETHASONE 1-0.05 % EX CREA: 1.0000 "application " | TOPICAL_CREAM | Freq: Two times a day (BID) | CUTANEOUS | 1 refills | Status: DC

## 2016-08-16 NOTE — Progress Notes (Signed)
Subjective:    Patient ID: [name], male    DOB: 09/16/1946, 70 y.o.   MRN: [id number]  Patient presents for Follow-up (4 months- is not fasting) Patient here to follow-up. His only concern is recurrent boils and cyst on his back. He has had multiple lanced in the past but there is still quite tender when they come up. He often come in the same areas where he has had incisions done. His wife states he also has a spot on his left leg this seems to clear up but then returned. She is still using the Lotrisone cream which helps and if it seems like the skin is breaking down she will use Triple Antibiotic ointment. He does scratch at the area.  He is followed by endocrinology however they accidentally did his labs early his A1c did return at 6.8 his creatinine was stable at 1.3 he is taking all his medicines as prescribed.  He does request a refill on his pain medication.    Review Of Systems:  GEN- denies fatigue, fever, weight loss,weakness, recent illness HEENT- denies eye drainage, change in vision, nasal discharge, CVS- denies chest pain, palpitations RESP- denies SOB, cough, wheeze ABD- denies N/V, change in stools, abd pain GU- denies dysuria, hematuria, dribbling, incontinence MSK- denies joint pain, muscle aches, injury Neuro- denies headache, dizziness, syncope, seizure activity       Objective:    BP 132/72 (BP Location: Left Arm, Patient Position: Sitting, Cuff Size: Large)   Pulse 88   Temp 98.6 F (37 C) (Oral)   Resp 18  GEN- NAD, alert and oriented x3 HEENT- Right pupil reactive EOMI, non injected sclera, pink conjunctiva, MMM, oropharynx clear CVS- RRR, no murmur RESP-CTAB ABD-NABS,soft,NT,ND Ext- Bilat chronic edema unchanged,venous stasis skin changes LE  sKIN- Left leg dry scaley circular lesion, no erythema, no hypopigmentation, few scabs at border Back- small cyst upper center, lower right flank- pus expressed from boil within scar, 2 small pustules  on back  Pulse-radial 2+ unable to palpate DP          Assessment & Plan:      Problem List Items Addressed This Visit    Type 2 diabetes mellitus with stage 2 chronic kidney disease ([place])    Followed by endocrinology his blood sugars well controlled His followed by podiatry Flu shot given      Sebaceous cyst    Noted on upper back      Relevant Orders   Ambulatory referral to Dermatology   Recurrent boils    Recurrent skin infections boils he also gets sebaceous cysts. When referring to dermatology I seem to have to treat him about every 4-5 months for infections on his skin to prevent worsening. He does not want any surgical intervention on these areas. I'm a little concerned about him being on chronic antibiotics as well. I will also have him take a look at the dry scaly lesion on his left leg to see if this needs to be biopsied. Given bactrim today      Relevant Medications   clotrimazole-betamethasone (LOTRISONE) cream   sulfamethoxazole-trimethoprim (BACTRIM DS,SEPTRA DS) 800-160 MG tablet   Other Relevant Orders   Ambulatory referral to Dermatology   Essential hypertension    Controlled no changes to meds      CKD (chronic kidney disease), stage III   Chronic abdominal pain    Pain medication refilled       Relevant Medications   oxyCODONE-acetaminophen (PERCOCET) 7.5-325  MG tablet    Other Visit Diagnoses    Need for prophylactic vaccination and inoculation against influenza    -  Primary   Relevant Orders   Flu Vaccine QUAD 36+ mos PF IM (Fluarix & Fluzone Quad PF) (Completed)      Note: This dictation was prepared with Dragon dictation along with smaller phrase technology. Any transcriptional errors that result from this process are unintentional.

## 2016-08-16 NOTE — Assessment & Plan Note (Signed)
Followed by endocrinology his blood sugars well controlled His followed by podiatry Flu shot given

## 2016-08-16 NOTE — Patient Instructions (Signed)
Antibiotics Referral to dermatology - Dr. Nevada Crane  Continue current medication F/U 4 months

## 2016-08-16 NOTE — Assessment & Plan Note (Signed)
Noted on upper back

## 2016-08-16 NOTE — Assessment & Plan Note (Signed)
Pain medication refilled

## 2016-08-16 NOTE — Assessment & Plan Note (Signed)
Controlled no changes to meds

## 2016-08-16 NOTE — Assessment & Plan Note (Addendum)
Recurrent skin infections boils he also gets sebaceous cysts. When referring to dermatology I seem to have to treat him about every 4-5 months for infections on his skin to prevent worsening. He does not want any surgical intervention on these areas. I'm a little concerned about him being on chronic antibiotics as well. I will also have him take a look at the dry scaly lesion on his left leg to see if this needs to be biopsied. Given bactrim today

## 2016-08-23 ENCOUNTER — Other Ambulatory Visit: Payer: Self-pay | Admitting: "Endocrinology

## 2016-08-30 DIAGNOSIS — H2512 Age-related nuclear cataract, left eye: Secondary | ICD-10-CM | POA: Diagnosis not present

## 2016-08-30 DIAGNOSIS — Z961 Presence of intraocular lens: Secondary | ICD-10-CM | POA: Diagnosis not present

## 2016-08-30 DIAGNOSIS — E119 Type 2 diabetes mellitus without complications: Secondary | ICD-10-CM | POA: Diagnosis not present

## 2016-08-30 LAB — HM DIABETES EYE EXAM

## 2016-09-01 ENCOUNTER — Other Ambulatory Visit: Payer: Self-pay | Admitting: Family Medicine

## 2016-09-06 DIAGNOSIS — L723 Sebaceous cyst: Secondary | ICD-10-CM | POA: Diagnosis not present

## 2016-09-06 DIAGNOSIS — I872 Venous insufficiency (chronic) (peripheral): Secondary | ICD-10-CM | POA: Diagnosis not present

## 2016-09-26 ENCOUNTER — Encounter: Payer: Self-pay | Admitting: "Endocrinology

## 2016-09-26 ENCOUNTER — Ambulatory Visit (INDEPENDENT_AMBULATORY_CARE_PROVIDER_SITE_OTHER): Payer: Medicare Other | Admitting: "Endocrinology

## 2016-09-26 ENCOUNTER — Other Ambulatory Visit: Payer: Self-pay | Admitting: Family Medicine

## 2016-09-26 VITALS — BP 105/74 | HR 78

## 2016-09-26 DIAGNOSIS — E782 Mixed hyperlipidemia: Secondary | ICD-10-CM

## 2016-09-26 DIAGNOSIS — E039 Hypothyroidism, unspecified: Secondary | ICD-10-CM

## 2016-09-26 DIAGNOSIS — N182 Chronic kidney disease, stage 2 (mild): Secondary | ICD-10-CM

## 2016-09-26 DIAGNOSIS — Z794 Long term (current) use of insulin: Secondary | ICD-10-CM | POA: Diagnosis not present

## 2016-09-26 DIAGNOSIS — I10 Essential (primary) hypertension: Secondary | ICD-10-CM

## 2016-09-26 DIAGNOSIS — E1122 Type 2 diabetes mellitus with diabetic chronic kidney disease: Secondary | ICD-10-CM | POA: Diagnosis not present

## 2016-09-26 MED ORDER — LEVOTHYROXINE SODIUM 75 MCG PO TABS: 75.0000 ug | ORAL_TABLET | Freq: Every day | ORAL | 3 refills | Status: DC

## 2016-09-26 NOTE — Progress Notes (Signed)
Subjective:    Patient ID: [name], male    DOB: [birth year],    Past Medical History:  Diagnosis Date  . Arthritis   . Asthma    "hx of"  . Barrett's esophagus    EGD 03/23/2011 & EGD 2/09 bx proven  . BPH (benign prostatic hyperplasia)   . Cancer of parotid gland ([place]) 11/23/12   Adenocarcinoma  . Chronic abdominal pain   . Colon polyp 03/23/2011   tubular adenoma, Dr. Gala Romney  . 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 ([place]) 07/15/2011  . CVA (cerebral infarction) 1998   right sided deficit  . Diverticulosis    TCS 03/23/11 pancolonic diverticula &TCS 5/08, pancolonic diverticula  . DM (diabetes mellitus) ([place])   . Edema of lower extremity 12/21/12   bilateral   . Esotropia of left eye   . GERD (gastroesophageal reflux disease)   . Glaucoma (increased eye pressure)   . Gout   . Hemorrhagic colitis 06/06/2012.  [name] Hemorrhoids, internal 03/23/2011   tcs by Dr. Gala Romney  . Hepatitis    esosiniphilic, tx with prednisone  . Hiatal hernia   . HTN (hypertension)   . Hx of radiation therapy 1974   right base of skull area-lymphoma  . Hyperlipidemia   . Lower facial weakness    Right  . Lymphoma ([place]) 1974   XRT at [facility], right base of skull area  . Renal insufficiency   . Steatohepatitis    liver biopsy 2009  . Stroke [facility]) 1998   right hemiparesis/plegia   Past Surgical History:  Procedure Laterality Date  . CHOLECYSTECTOMY    . COLONOSCOPY  03/23/11   Dr. Gala Romney  pancolonic diverticula, hemorrhoids, tubular adenoma.. next tcs 03/2016  . ESOPHAGOGASTRODUODENOSCOPY  02/05/08   goblet cell metaplasia/negative for H.pylori  . ESOPHAGOGASTRODUODENOSCOPY  03/23/11   Dr. Gala Romney, barretts, hiatal hernia  . MASS BIOPSY  11/01/2012   Procedure: NECK MASS BIOPSY;  Surgeon: Ascencion Dike, MD;  Location: [facility];  Service: ENT;  Laterality: Right;  Excisional Bx Right Neck Mass; attempted external jugular cutdown of left side  . PAROTIDECTOMY  11/24/2012   Procedure:  PAROTIDECTOMY;  Surgeon: Ascencion Dike, MD;  Location: [facility];  Service: ENT;  Laterality: N/A;  Total parotidectomy  . PLEURECTOMY    . right lymph node removal    . Right video-assisted thoracic surgery, pleurectomy, and pleurodesis  2011   Social History   Social History  . Marital status: Married    Spouse name: N/A  . Number of children: 1  . Years of education: N/A   Occupational History  . disabled    Social History Main Topics  . Smoking status: Former Smoker    Packs/day: 3.00    Types: Cigarettes    Quit date: 03/01/1997  . Smokeless tobacco: Never Used  . Alcohol use No  . Drug use: No  . Sexual activity: No   Other Topics Concern  . None   Social History Narrative  . None   Outpatient Encounter Prescriptions as of 09/26/2016  Medication Sig  . allopurinol (ZYLOPRIM) 300 MG tablet TAKE 1 TABLET BY MOUTH EVERY DAY  . amLODipine (NORVASC) 10 MG tablet TAKE 1 TABLET BY MOUTH DAILY  . cloNIDine (CATAPRES) 0.2 MG tablet TAKE 1 TABLET BY MOUTH THREE TIMES DAILY  . clotrimazole-betamethasone (LOTRISONE) cream Apply 1 application topically 2 (two) times daily.  . furosemide (LASIX) 40 MG tablet TAKE 1 TABLET BY  MOUTH DAILY  . gabapentin (NEURONTIN) 300 MG capsule TAKE 2 CAPSULES(600 MG) BY MOUTH THREE TIMES DAILY  . Insulin Glargine (LANTUS SOLOSTAR) 100 UNIT/ML Solostar Pen Inject 50 Units into the skin at bedtime.  . insulin lispro (HUMALOG KWIKPEN) 100 UNIT/ML KiwkPen Inject 0.05-0.11 mLs (5-11 Units total) into the skin 3 (three) times daily with meals.  [name] levothyroxine (SYNTHROID, LEVOTHROID) 75 MCG tablet Take 1 tablet (75 mcg total) by mouth daily.  [name] linagliptin (TRADJENTA) 5 MG TABS tablet Take 1 tablet by mouth  daily  . lisinopril (PRINIVIL,ZESTRIL) 20 MG tablet TAKE 1 TABLET BY MOUTH DAILY  . metFORMIN (GLUCOPHAGE) 500 MG tablet TAKE 1 TABLET BY MOUTH TWICE DAILY  . metoprolol succinate (TOPROL-XL) 50 MG 24 hr tablet TAKE 1 TABLET BY MOUTH DAILY WITH OR  IMMEDIATELY FOLLOWING A MEAL  . oxyCODONE-acetaminophen (PERCOCET) 7.5-325 MG tablet Take 1 tablet by mouth every 4 (four) hours as needed.  . pantoprazole (PROTONIX) 40 MG tablet TAKE 1 TABLET BY MOUTH DAILY  . potassium chloride SA (K-DUR,KLOR-CON) 20 MEQ tablet TAKE 1 TABLET(20 MEQ) BY MOUTH DAILY  . simvastatin (ZOCOR) 20 MG tablet TAKE 1 TABLET BY MOUTH EVERY NIGHT AT BEDTIME  . sulfamethoxazole-trimethoprim (BACTRIM DS,SEPTRA DS) 800-160 MG tablet Take 1 tablet by mouth 2 (two) times daily.  . tamsulosin (FLOMAX) 0.4 MG CAPS capsule TAKE 1 CAPSULE BY MOUTH DAILY  . tiZANidine (ZANAFLEX) 2 MG tablet TAKE 1 TABLET BY MOUTH TWICE DAILY AS NEEDED  . [DISCONTINUED] levothyroxine (SYNTHROID, LEVOTHROID) 50 MCG tablet TAKE 1 TABLET BY MOUTH EVERY MORNING   No facility-administered encounter medications on file as of 09/26/2016.    ALLERGIES: No Known Allergies VACCINATION STATUS: Immunization History  Administered Date(s) Administered  . Influenza Split 08/24/2012  . Influenza,inj,Quad PF,36+ Mos 10/09/2013, 09/05/2014, 11/23/2015, 08/16/2016  . Pneumococcal Conjugate-13 01/07/2014  . Pneumococcal Polysaccharide-23 11/25/2012    Diabetes  He presents for his follow-up diabetic visit. He has type 2 diabetes mellitus. Onset time: diagnosed approx at age 15. His disease course has been stable. There are no hypoglycemic associated symptoms. Pertinent negatives for hypoglycemia include no confusion, headaches, pallor or seizures. Associated symptoms include weakness. Pertinent negatives for diabetes include no chest pain, no fatigue, no polydipsia, no polyphagia and no polyuria. Symptoms are stable. Diabetic complications include a CVA. He never participates in exercise. His breakfast blood glucose range is generally 140-180 mg/dl. His overall blood glucose range is 140-180 mg/dl. An ACE inhibitor/angiotensin II receptor blocker is being taken.  Hyperlipidemia  Pertinent negatives include no  chest pain, myalgias or shortness of breath.  Hypertension  Pertinent negatives include no chest pain, headaches, neck pain, palpitations or shortness of breath. Hypertensive end-organ damage includes CVA.     Review of Systems  Constitutional: Negative for chills, fatigue, fever and unexpected weight change.  HENT: Negative for dental problem, mouth sores and trouble swallowing.   Eyes: Negative for visual disturbance.  Respiratory: Negative for cough, choking, chest tightness, shortness of breath and wheezing.   Cardiovascular: Negative for chest pain, palpitations and leg swelling.  Gastrointestinal: Negative for abdominal distention, abdominal pain, constipation, diarrhea, nausea and vomiting.  Endocrine: Negative for polydipsia, polyphagia and polyuria.  Genitourinary: Negative for dysuria, flank pain, hematuria and urgency.  Musculoskeletal: Negative for back pain, gait problem, joint swelling, myalgias and neck pain.  Skin: Negative for pallor, rash and wound.  Neurological: Positive for weakness. Negative for seizures, numbness and headaches.  Psychiatric/Behavioral: Negative for confusion, dysphoric mood, hallucinations and suicidal ideas.  Objective:    BP 105/74   Pulse 78   Wt Readings from Last 3 Encounters:  05/23/16 260 lb (117.9 kg)  06/26/15 265 lb (120.2 kg)  06/20/14 265 lb (120.2 kg)    Physical Exam  Constitutional: He is oriented to person, place, and time. He appears well-developed and well-nourished. He is cooperative.  HENT:  Head: Normocephalic and atraumatic.  Eyes: EOM are normal.  Neck: Normal range of motion. Neck supple. No tracheal deviation present. No thyromegaly present.  Cardiovascular: Normal rate and normal heart sounds.  Exam reveals no gallop.   No murmur heard. Pulses:      Dorsalis pedis pulses are 1+ on the right side.       Posterior tibial pulses are 1+ on the right side.  Pulmonary/Chest: Breath sounds normal. No respiratory  distress. He has no wheezes.  Abdominal: Soft. Bowel sounds are normal. He exhibits no distension.  Musculoskeletal: He exhibits no edema.  Neurological: He is alert and oriented to person, place, and time. A sensory deficit is present. No cranial nerve deficit. He exhibits normal muscle tone. Gait normal.  Pt is wheelchair bound due to past recurrent CVA  Skin: Skin is warm and dry. No rash noted. No cyanosis. Nails show no clubbing.  Psychiatric: He has a normal mood and affect. His speech is normal and behavior is normal. Judgment and thought content normal. Cognition and memory are normal.    CMP  CMP Latest Ref Rng & Units 08/09/2016 06/17/2016 05/23/2016  Glucose 65 - 99 mg/dL 110(H) 105(H) 166(H)  BUN 7 - 25 mg/dL 19 14 22(H)  Creatinine 0.70 - 1.25 mg/dL 1.35(H) 1.14 1.51(H)  Sodium 135 - 146 mmol/L 138 138 139  Potassium 3.5 - 5.3 mmol/L 4.5 4.6 4.3  Chloride 98 - 110 mmol/L 104 107 107  CO2 20 - 31 mmol/L 23 24 22   Calcium 8.6 - 10.3 mg/dL 10.6(H) 9.9 10.4(H)  Total Protein 6.1 - 8.1 g/dL 6.9 - 7.8  Total Bilirubin 0.2 - 1.2 mg/dL 0.3 - 0.5  Alkaline Phos 40 - 115 U/L 76 - 74  AST 10 - 35 U/L 15 - 19  ALT 9 - 46 U/L 14 - 17    Diabetic Labs (most recent): Lab Results  Component Value Date   HGBA1C 6.8 (H) 08/09/2016   HGBA1C 7.2 (H) 06/17/2016   HGBA1C 6.9 (H) 03/04/2016   Lipid Panel     Component Value Date/Time   CHOL 146 12/03/2015 0821   TRIG 180 (H) 12/03/2015 0821   HDL 50 12/03/2015 0821   CHOLHDL 2.9 12/03/2015 0821   VLDL 36 (H) 12/03/2015 0821   LDLCALC 60 12/03/2015 0821     Assessment & Plan:   1. Type 2 diabetes mellitus with stage 2 chronic kidney disease and CVA - patient remains at a high risk for more acute and chronic complications of diabetes which include CAD, CVA, CKD, retinopathy, and neuropathy. These are all discussed in detail with the patient.  Patient came with controlled glucose profile, and  recent A1c of 6.8 %.  Glucose logs and  insulin administration records pertaining to this visit,  to be scanned into patient's records.  Recent labs reviewed.   - I have re-counseled the patient on diet management and weight loss  by adopting a carbohydrate restricted / protein rich  Diet.  - Suggestion is made for patient to avoid simple carbohydrates   from their diet including Cakes , Desserts, Ice Cream,  Soda (  diet and regular) , Sweet Tea , Candies,  Chips, Cookies, Artificial Sweeteners,   and "Sugar-free" Products .  This will help patient to have stable blood glucose profile and potentially avoid unintended  Weight gain.  - Patient is advised to stick to a routine mealtimes to eat 3 meals  a day and avoid unnecessary snacks ( to snack only to correct hypoglycemia).    - I have approached patient with the following individualized plan to manage diabetes and patient agrees.  - I will continue basal insulin Lantus  50 units QHS, and  Continue  Humalog  5 units TIDAC for pre-meal BG readings of 90-171m/dl, plus patient specific correction dose of rapid acting insulin  for unexpected hyperglycemia above 1569mdl, associated with strict monitoring of glucose  AC and HS. - Patient is warned not to take insulin without proper monitoring per orders. -Adjustment parameters are given for hypo and hyperglycemia in writing. -Patient is encouraged to call clinic for blood glucose levels less than 70 or above 300 mg /dl. - I will continue  Metformin 50056mo BID, Tradjenta 5 mg by mouth daily, therapeutically suitable for patient. -Patient is not a candidate for metformin andSGLT2 inhibitors due to CKD.  - Patient specific target  for A1c; LDL, HDL, Triglycerides, and  Waist Circumference were discussed in detail.  2) BP/HTN: Controlled. Continue current medications including ACEI/ARB. 3) Lipids/HPL:  continue statins. 4)  Weight/Diet: He is limited on how much she can exercise and carbohydrates information provided.  5)  hypothyroidism: - He would benefit from slight increase in his levothyroxine. I will increase levothyroxine to 75 g by mouth every morning.    - We discussed about correct intake of levothyroxine, at fasting, with water, separated by at least 30 minutes from breakfast, and separated by more than 4 hours from calcium, iron, multivitamins, acid reflux medications (PPIs). -Patient is made aware of the fact that thyroid hormone replacement is needed for life, dose to be adjusted by periodic monitoring of thyroid function tests.  6) Chronic Care/Health Maintenance:  -Patient is on ACEI/ARB and Statin medications and encouraged to continue to follow up with Ophthalmology, Podiatrist at least yearly or according to recommendations, and advised to  stay away from smoking. I have recommended yearly flu vaccine and pneumonia vaccination at least every 5 years; moderate intensity exercise for up to 150 minutes weekly; and  sleep for at least 7 hours a day.  - 25 minutes of time was spent on the care of this patient , 50% of which was applied for counseling on diabetes complications and their preventions.  - I advised patient to maintain close follow up with Dr. DurBuelah Manisr primary care needs.  Patient is asked to bring meter and  blood glucose logs during their next visit.   Follow up plan: Return in about 3 months (around 12/27/2016) for follow up with pre-visit labs, meter, and logs.  GebGlade LloydD Phone: [phone]: [phone]/16/2017, 2:48 PM

## 2016-09-27 ENCOUNTER — Ambulatory Visit (INDEPENDENT_AMBULATORY_CARE_PROVIDER_SITE_OTHER): Payer: Medicare Other | Admitting: Podiatry

## 2016-09-27 DIAGNOSIS — B351 Tinea unguium: Secondary | ICD-10-CM

## 2016-09-27 DIAGNOSIS — M79676 Pain in unspecified toe(s): Secondary | ICD-10-CM | POA: Diagnosis not present

## 2016-09-27 DIAGNOSIS — E1149 Type 2 diabetes mellitus with other diabetic neurological complication: Secondary | ICD-10-CM | POA: Diagnosis not present

## 2016-09-27 NOTE — Progress Notes (Signed)
Patient ID: BECKER CHRISTOPHER, male   DOB: Aug 14, 1946, 70 y.o.   MRN: [id number]  Complaint:  Visit Type: Patient returns to my office for continued preventative foot care services. Complaint: Patient states" my nails have grown long and thick and become painful to walk and wear shoes" Patient has been diagnosed with DM with  neuropathy.  Post CVA with significant swelling both feet.[name] He presents for preventative foot care services. No changes to ROS  Podiatric Exam: Vascular: dorsalis pedis and posterior tibial pulses are not palpable due to swelling both feet.. Capillary return is immediate. Temperature gradient is WNL. Skin turgor WNL  Sensorium: Diminished  Semmes Weinstein monofilament test. Normal tactile sensation. Nail Exam: Pt has thick disfigured discolored nails with subungual debris noted bilateral entire nail hallux through fifth toenails Ulcer Exam: There is no evidence of ulcer or pre-ulcerative changes or infection. Orthopedic Exam: Muscle tone and strength are WNL. No limitations in general ROM. No crepitus or effusions noted. Foot type and digits show no abnormalities. Bony prominences are unremarkable. Skin: No Porokeratosis. No infection or ulcers  Diagnosis:  Tinea unguium, Pain in right toe, pain in left toes  Treatment & Plan Procedures and Treatment: Consent by patient was obtained for treatment procedures. The patient understood the discussion of treatment and procedures well. All questions were answered thoroughly reviewed. Debridement of mycotic and hypertrophic toenails, 1 through 5 bilateral and clearing of subungual debris. No ulceration, no infection noted.  Return Visit-Office Procedure: Patient instructed to return to the office for a follow up visit 3 months for continued evaluation and treatment.   Gardiner Barefoot DPM

## 2016-09-29 ENCOUNTER — Other Ambulatory Visit: Payer: Self-pay | Admitting: Family Medicine

## 2016-09-29 ENCOUNTER — Encounter: Payer: Self-pay | Admitting: Family Medicine

## 2016-10-19 ENCOUNTER — Other Ambulatory Visit: Payer: Self-pay | Admitting: Family Medicine

## 2016-10-23 ENCOUNTER — Other Ambulatory Visit: Payer: Self-pay | Admitting: Family Medicine

## 2016-10-28 ENCOUNTER — Other Ambulatory Visit: Payer: Self-pay | Admitting: Family Medicine

## 2016-10-30 ENCOUNTER — Other Ambulatory Visit: Payer: Self-pay | Admitting: "Endocrinology

## 2016-11-06 ENCOUNTER — Other Ambulatory Visit: Payer: Self-pay | Admitting: "Endocrinology

## 2016-11-07 ENCOUNTER — Other Ambulatory Visit: Payer: Self-pay | Admitting: Family Medicine

## 2016-11-09 ENCOUNTER — Ambulatory Visit (INDEPENDENT_AMBULATORY_CARE_PROVIDER_SITE_OTHER): Payer: Medicare Other | Admitting: Gastroenterology

## 2016-11-09 ENCOUNTER — Encounter: Payer: Self-pay | Admitting: Gastroenterology

## 2016-11-09 VITALS — BP 110/75 | HR 83 | Temp 98.5°F | Ht 72.0 in

## 2016-11-09 DIAGNOSIS — R1011 Right upper quadrant pain: Secondary | ICD-10-CM | POA: Diagnosis not present

## 2016-11-09 DIAGNOSIS — K2270 Barrett's esophagus without dysplasia: Secondary | ICD-10-CM | POA: Diagnosis not present

## 2016-11-09 DIAGNOSIS — D126 Benign neoplasm of colon, unspecified: Secondary | ICD-10-CM | POA: Insufficient documentation

## 2016-11-09 NOTE — Patient Instructions (Signed)
1. Please have your labs done. 2. Upper endoscopy and colonoscopy as scheduled. See separate instructions.

## 2016-11-09 NOTE — Progress Notes (Signed)
Primary Care Physician:  Vic Blackbird, MD  Primary Gastroenterologist:  Garfield Cornea, MD   Chief Complaint  Patient presents with  . Abdominal Pain    right side pain    HPI:  [name] is a 70 y.o. male here for further evaluation of chronic right-sided abdominal pain. Patient was last seen in 2012 for the same symptoms. He has a history of Barrett's esophagus without dysplasia and tubular adenoma removed from his colon (poor prep). Overdue for surveillance exam for both of these. He has had vague right-sided abdominal pain for years. Sometimes pain is severe and debilitating. Gallbladder removed remotely. Patient has had episodes of elevated transaminases, peripheral eosinophilia in the past which responded to prednisone, ? Eosinophilic hepatitis. January 2009 he had a liver biopsy showing steatohepatitis. Workup also included MRCP in January 2009 for abnormal LFTs and right upper quadrant abdominal pain which was normal. He has had negative viral markers, ANA, AMA, ASMA. Normal alpha-1 antitrypsin level, ceruloplasmin, iron.  Patient complains of persistent right upper quadrant pain. Pain is constant. Some days worse than others. He believes is worse with meals. No vomiting. Has a bowel movement both days. No melena, rectal bleeding. He is really not sure what he weighs. He has chronic lower extremity edema, minimal ambulation. No heartburn, dysphagia.    Current Outpatient Prescriptions  Medication Sig Dispense Refill  . allopurinol (ZYLOPRIM) 300 MG tablet TAKE 1 TABLET BY MOUTH EVERY DAY 90 tablet 0  . amLODipine (NORVASC) 10 MG tablet TAKE 1 TABLET BY MOUTH DAILY 90 tablet 0  . cloNIDine (CATAPRES) 0.2 MG tablet TAKE 1 TABLET BY MOUTH THREE TIMES DAILY 270 tablet 0  . clotrimazole-betamethasone (LOTRISONE) cream Apply 1 application topically 2 (two) times daily. 45 g 1  . furosemide (LASIX) 40 MG tablet TAKE 1 TABLET BY MOUTH DAILY 90 tablet 0  . gabapentin (NEURONTIN) 300 MG  capsule TAKE 2 CAPSULES(600 MG) BY MOUTH THREE TIMES DAILY 540 capsule 0  . Insulin Glargine (LANTUS SOLOSTAR) 100 UNIT/ML Solostar Pen Inject 50 Units into the skin at bedtime. 15 mL 2  . insulin lispro (HUMALOG KWIKPEN) 100 UNIT/ML KiwkPen Inject 0.05-0.11 mLs (5-11 Units total) into the skin 3 (three) times daily with meals. 15 mL 2  . levothyroxine (SYNTHROID, LEVOTHROID) 75 MCG tablet Take 1 tablet (75 mcg total) by mouth daily. 30 tablet 3  . linagliptin (TRADJENTA) 5 MG TABS tablet Take 1 tablet by mouth  daily 90 tablet 2  . lisinopril (PRINIVIL,ZESTRIL) 20 MG tablet TAKE 1 TABLET BY MOUTH DAILY 90 tablet 0  . metFORMIN (GLUCOPHAGE) 500 MG tablet TAKE 1 TABLET BY MOUTH TWICE DAILY 180 tablet 0  . metoprolol succinate (TOPROL-XL) 50 MG 24 hr tablet TAKE 1 TABLET BY MOUTH DAILY WITH OR IMMEDIATELY FOLLOWING A MEAL 90 tablet 1  . oxyCODONE-acetaminophen (PERCOCET) 7.5-325 MG tablet Take 1 tablet by mouth every 4 (four) hours as needed. 180 tablet 0  . pantoprazole (PROTONIX) 40 MG tablet TAKE 1 TABLET BY MOUTH DAILY 90 tablet 0  . potassium chloride SA (K-DUR,KLOR-CON) 20 MEQ tablet TAKE 1 TABLET(20 MEQ) BY MOUTH DAILY 30 tablet 2  . simvastatin (ZOCOR) 20 MG tablet TAKE 1 TABLET BY MOUTH EVERY NIGHT AT BEDTIME 90 tablet 0  . tamsulosin (FLOMAX) 0.4 MG CAPS capsule TAKE 1 CAPSULE BY MOUTH DAILY 90 capsule 0  . tiZANidine (ZANAFLEX) 2 MG tablet TAKE 1 TABLET BY MOUTH TWICE DAILY AS NEEDED 180 tablet 0  . ONETOUCH VERIO test strip TEST  FOUR TIMES DAILY 150 each 3   No current facility-administered medications for this visit.     Allergies as of 11/09/2016  . (No Known Allergies)    Past Medical History:  Diagnosis Date  . Arthritis   . Asthma    "hx of"  . Barrett's esophagus    EGD 03/23/2011 & EGD 2/09 bx proven  . BPH (benign prostatic hyperplasia)   . Cancer of parotid gland ([place]) 11/23/12   Adenocarcinoma  . Chronic abdominal pain   . Colon polyp 03/23/2011   tubular adenoma,  Dr. Gala Romney  . 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 ([place]) 07/15/2011  . CVA (cerebral infarction) 1998   right sided deficit  . Diverticulosis    TCS 03/23/11 pancolonic diverticula &TCS 5/08, pancolonic diverticula  . DM (diabetes mellitus) ([place])   . Edema of lower extremity 12/21/12   bilateral   . Esotropia of left eye   . GERD (gastroesophageal reflux disease)   . Glaucoma (increased eye pressure)   . Gout   . Hemorrhagic colitis 06/06/2012.  [name] Hemorrhoids, internal 03/23/2011   tcs by Dr. Gala Romney  . Hepatitis    esosiniphilic, tx with prednisone  . Hiatal hernia   . HTN (hypertension)   . Hx of radiation therapy 1974   right base of skull area-lymphoma  . Hyperlipidemia   . Lower facial weakness    Right  . Lymphoma ([place]) 1974   [facility] at [facility], right base of skull area  . Renal insufficiency   . Steatohepatitis    liver biopsy 2009  . Stroke [facility]) 1998   right hemiparesis/plegia    Past Surgical History:  Procedure Laterality Date  . CHOLECYSTECTOMY    . COLONOSCOPY  03/23/11   Dr. Gala Romney  pancolonic diverticula, hemorrhoids, tubular adenoma.. next tcs 03/2016  . ESOPHAGOGASTRODUODENOSCOPY  02/05/08   goblet cell metaplasia/negative for H.pylori  . ESOPHAGOGASTRODUODENOSCOPY  03/23/11   Dr. Gala Romney, barretts, hiatal hernia  . MASS BIOPSY  11/01/2012   Procedure: NECK MASS BIOPSY;  Surgeon: Ascencion Dike, MD;  Location: [facility];  Service: ENT;  Laterality: Right;  Excisional Bx Right Neck Mass; attempted external jugular cutdown of left side  . PAROTIDECTOMY  11/24/2012   Procedure: PAROTIDECTOMY;  Surgeon: Ascencion Dike, MD;  Location: [facility];  Service: ENT;  Laterality: N/A;  Total parotidectomy  . PLEURECTOMY    . right lymph node removal    . Right video-assisted thoracic surgery, pleurectomy, and pleurodesis  2011    Family History  Problem Relation Age of Onset  . Heart failure Mother   . Heart failure Father   . Heart failure Sister   . Heart failure Son      Social History   Social History  . Marital status: Married    Spouse name: N/A  . Number of children: 1  . Years of education: N/A   Occupational History  . disabled    Social History Main Topics  . Smoking status: Former Smoker    Packs/day: 3.00    Types: Cigarettes    Quit date: 03/01/1997  . Smokeless tobacco: Never Used  . Alcohol use No  . Drug use: No  . Sexual activity: No   Other Topics Concern  . Not on file   Social History Narrative  . No narrative on file      ROS:  General: Negative for anorexia, weight loss, fever, chills, fatigue, +weakness. Eyes: Negative for vision changes.  ENT: Negative for hoarseness, difficulty swallowing , nasal congestion. CV: Negative for chest pain, angina, palpitations, dyspnea on exertion, +chronic peripheral edema.  Respiratory: Negative for dyspnea at rest, dyspnea on exertion, cough, sputum, wheezing.  GI: See history of present illness. GU:  Negative for dysuria, hematuria, urinary incontinence, urinary frequency, nocturnal urination.  MS: Negative for joint pain, low back pain.  Derm: Negative for rash or itching.  Neuro: Positive for weakness, abnormal sensation. No seizure, frequent headaches, memory loss, confusion.  Psych: Negative for anxiety, depression, suicidal ideation, hallucinations.  Endo: Negative for unusual weight change.  Heme: Negative for bruising or bleeding. Allergy: Negative for rash or hives.    Physical Examination:  BP 110/75   Pulse 83   Temp 98.5 F (36.9 C) (Oral)   Ht 6' (1.829 m)    General: chronically ill-appearing male in NAD. In a manual wheelchair. accompanied by his wife.   Head: Normocephalic, atraumatic.   Eyes: Conjunctiva pink, no icterus. Mouth: Oropharyngeal mucosa moist and pink , no lesions erythema or exudate. Neck: Supple without thyromegaly, masses, or lymphadenopathy.  Lungs: Clear to auscultation bilaterally.  Heart: Regular rate and rhythm, no murmurs  rubs or gallops.  Abdomen: Bowel sounds are normal, mild to moderate right upper quadrant tenderness, nondistended, no hepatosplenomegaly or masses, no abdominal bruits or    hernia , no rebound or guarding.  Difficult to examine in wheelchair Rectal: not performed Extremities: 2+pitting lower extremity edema. No clubbing or deformities.  Neuro: Alert and oriented x 4 , right upper ext weakness Skin: Warm and dry, no rash or jaundice.   Psych: Alert and cooperative, normal mood and affect.  Labs: Lab Results  Component Value Date   CREATININE 1.35 (H) 08/09/2016   BUN 19 08/09/2016   NA 138 08/09/2016   K 4.5 08/09/2016   CL 104 08/09/2016   CO2 23 08/09/2016   Lab Results  Component Value Date   ALT 14 08/09/2016   AST 15 08/09/2016   ALKPHOS 76 08/09/2016   BILITOT 0.3 08/09/2016   Lab Results  Component Value Date   WBC 14.5 (H) 05/23/2016   HGB 13.7 05/23/2016   HCT 43.0 05/23/2016   MCV 74.7 (L) 05/23/2016   PLT 212 05/23/2016    Lab Results  Component Value Date   TSH 2.59 08/09/2016   Hgb A1C 6.8  Imaging Studies:   Study Result   CLINICAL DATA:  Patient developed right-sided abdominal pain and chills while eating dinner. Nausea. History of diabetes.  EXAM: CT ABDOMEN AND PELVIS WITH CONTRAST  TECHNIQUE: Multidetector CT imaging of the abdomen and pelvis was performed using the standard protocol following bolus administration of intravenous contrast.  CONTRAST:  138m ISOVUE-300 IOPAMIDOL (ISOVUE-300) INJECTION 61%  COMPARISON:  06/20/2014  FINDINGS: Interstitial fibrosis and scattered emphysematous changes in the lung bases. Small esophageal hiatal hernia. Coronary artery calcifications.  Surgical absence of the gallbladder. No bile duct dilatation. Bilateral renal cysts are similar to prior study. No hydronephrosis. The liver, spleen, pancreas, adrenal glands, abdominal aorta, inferior vena cava, and retroperitoneal lymph nodes  are unremarkable. Stomach, small bowel, and colon are not abnormally distended. No free air or free fluid in the abdomen. Ventral periumbilical abdominal wall hernias containing fat. No bowel herniation.  Pelvis: Prostate gland is enlarged, measuring 5.5 cm diameter. Bladder wall is not thickened. No free or loculated pelvic fluid collections. No pelvic mass or lymphadenopathy. Diverticulosis of the sigmoid colon without evidence of diverticulitis. The appendix is normal. Mixed  lytic and sclerotic bone lesion within the L3 vertebral body with Schmorl's node at the inferior endplate. Appearance is unchanged since previous study. Mild degenerative changes in the spine.  IMPRESSION: No acute abnormality demonstrated within the abdomen or pelvis. Periumbilical abdominal wall hernia containing fat. Prostate gland enlargement. Unchanged appearance of vague mixed lytic and sclerotic lesion in the L3 vertebra.   Electronically Signed   By: Lucienne Capers M.D.   On: 05/23/2016 23:38

## 2016-11-10 ENCOUNTER — Other Ambulatory Visit: Payer: Self-pay

## 2016-11-10 DIAGNOSIS — R1011 Right upper quadrant pain: Secondary | ICD-10-CM

## 2016-11-10 DIAGNOSIS — K22719 Barrett's esophagus with dysplasia, unspecified: Secondary | ICD-10-CM

## 2016-11-10 DIAGNOSIS — Z86010 Personal history of colonic polyps: Secondary | ICD-10-CM

## 2016-11-10 LAB — CBC WITH DIFFERENTIAL/PLATELET
BASOS ABS: 0 {cells}/uL (ref 0–200)
Basophils Relative: 0 %
EOS PCT: 11 %
Eosinophils Absolute: 847 cells/uL — ABNORMAL HIGH (ref 15–500)
HCT: 40.5 % (ref 38.5–50.0)
Hemoglobin: 12.8 g/dL — ABNORMAL LOW (ref 13.2–17.1)
LYMPHS PCT: 19 %
Lymphs Abs: 1463 cells/uL (ref 850–3900)
MCH: 23.7 pg — AB (ref 27.0–33.0)
MCHC: 31.6 g/dL — ABNORMAL LOW (ref 32.0–36.0)
MCV: 75.1 fL — ABNORMAL LOW (ref 80.0–100.0)
MONOS PCT: 7 %
Monocytes Absolute: 539 cells/uL (ref 200–950)
NEUTROS ABS: 4851 {cells}/uL (ref 1500–7800)
Neutrophils Relative %: 63 %
PLATELETS: 233 10*3/uL (ref 140–400)
RBC: 5.39 MIL/uL (ref 4.20–5.80)
RDW: 19.1 % — AB (ref 11.0–15.0)
WBC: 7.7 10*3/uL (ref 3.8–10.8)

## 2016-11-10 LAB — HEPATIC FUNCTION PANEL
ALBUMIN: 3.7 g/dL (ref 3.6–5.1)
ALK PHOS: 71 U/L (ref 40–115)
ALT: 11 U/L (ref 9–46)
AST: 13 U/L (ref 10–35)
BILIRUBIN TOTAL: 0.3 mg/dL (ref 0.2–1.2)
Bilirubin, Direct: 0 mg/dL (ref <=0.2)
Indirect Bilirubin: 0.3 mg/dL (ref 0.2–1.2)
TOTAL PROTEIN: 6.8 g/dL (ref 6.1–8.1)

## 2016-11-10 MED ORDER — PEG 3350-KCL-NA BICARB-NACL 420 G PO SOLR: 4000.0000 mL | ORAL | 0 refills | Status: DC

## 2016-11-10 NOTE — Assessment & Plan Note (Addendum)
70 year old gentleman with history of stroke, right-sided weakness, who presents with acute on chronic right upper quadrant abdominal pain. Pain is constant with episodes of increased severity. Possibly related to meals. Worse with palpation. Extensive evaluation in the past as outlined. We have previously felt that pain could at least be explained in part due to musculoskeletal or neuropathic component related to his posturing or neurological deficit status post stroke.   Given acute on chronic flare. We will reevaluate his labs including CBC with differential, LFTs. Of note, he is overdue for EGD for Barrett's surveillance as well as colonoscopy for history of adenomatous colon polyps. Patient is interested in pursuing both EGD and colonoscopy at this time. We'll plan on deep sedation in the OR given polypharmacy. I have discussed the risks, alternatives, benefits with regards to but not limited to the risk of reaction to medication, bleeding, infection, perforation and the patient is agreeable to proceed. Written consent to be obtained.  ADDENDUM: PERIPHERAL EOSINOPHILIA AGAIN. RUQ PAIN. CONSIDER BX TO LOOK FOR EOSINOPHILIC GASTROENTERITIS.

## 2016-11-11 NOTE — Progress Notes (Signed)
cc'ed to pcp °

## 2016-11-14 NOTE — Patient Instructions (Signed)
PA for TCS/EGD: [id number]

## 2016-11-15 ENCOUNTER — Telehealth: Payer: Self-pay | Admitting: Internal Medicine

## 2016-11-15 ENCOUNTER — Telehealth: Payer: Self-pay | Admitting: *Deleted

## 2016-11-15 MED ORDER — OXYCODONE-ACETAMINOPHEN 7.5-325 MG PO TABS
1.0000 | ORAL_TABLET | ORAL | 0 refills | Status: DC | PRN
Start: 2016-11-15 — End: 2016-12-20

## 2016-11-15 NOTE — Telephone Encounter (Signed)
Routing to [facility] 

## 2016-11-15 NOTE — Telephone Encounter (Signed)
SEE RESULT NOTE.

## 2016-11-15 NOTE — Telephone Encounter (Signed)
okay

## 2016-11-15 NOTE — Progress Notes (Signed)
LFTs normal. Some peripheral eosinophilia again.  EGD/TCS as planned.  DR. Gala Romney, CONSIDER BIOPSIES FOR EVALUATION OF EOSINOPHILIC GASTROENTERITIS.

## 2016-11-15 NOTE — Telephone Encounter (Signed)
Pt had labs done last Friday and was calling for the results. [phone]

## 2016-11-15 NOTE — Telephone Encounter (Signed)
Prescription printed and patient made aware to come to office to pick up on 11/16/2016 per VM.

## 2016-11-15 NOTE — Telephone Encounter (Signed)
Received call from patient wife.   Requested refill on Percocet.   Ok to refill??  Last office visit/ refill 08/16/2016.

## 2016-11-23 NOTE — Patient Instructions (Signed)
[name]  11/23/2016     @PREFPERIOPPHARMACY @   Your procedure is scheduled on  12/01/2016   Report to [facility] at  1045  A.M.  Call this number if you have problems the morning of surgery:  [phone]   Remember:  Do not eat food or drink liquids after midnight.  Take these medicines the morning of surgery with A SIP OF WATER  Allopurinol, norvasc, catapress, neurontin, levothyroxine, lisinopril, metoprolol, oxycodone, protonix, flomax, zanaflex. Take 1/2 of your usual insulin dosage the night before your procedure. DO NOT take any medicine for diabetes the morning of your procedure.   Do not wear jewelry, make-up or nail polish.  Do not wear lotions, powders, or perfumes, or deoderant.  Do not shave 48 hours prior to surgery.  Men may shave face and neck.  Do not bring valuables to the hospital.  [facility] is not responsible for any belongings or valuables.  Contacts, dentures or bridgework may not be worn into surgery.  Leave your suitcase in the car.  After surgery it may be brought to your room.  For patients admitted to the hospital, discharge time will be determined by your treatment team.  Patients discharged the day of surgery will not be allowed to drive home.   Name and phone number of your driver:   family Special instructions:  Follow the diet and prep instructions given to you by Dr Roseanne Kaufman office.  Please read over the following fact sheets that you were given. Anesthesia Post-op Instructions and Care and Recovery After Surgery       Esophagogastroduodenoscopy Introduction Esophagogastroduodenoscopy (EGD) is a procedure to examine the lining of the esophagus, stomach, and first part of the small intestine (duodenum). This procedure is done to check for problems such as inflammation, bleeding, ulcers, or growths. During this procedure, a long, flexible, lighted tube with a camera attached (endoscope) is inserted down the  throat. Tell a health care provider about:  Any allergies you have.  All medicines you are taking, including vitamins, herbs, eye drops, creams, and over-the-counter medicines.  Any problems you or family members have had with anesthetic medicines.  Any blood disorders you have.  Any surgeries you have had.  Any medical conditions you have.  Whether you are pregnant or may be pregnant. What are the risks? Generally, this is a safe procedure. However, problems may occur, including:  Infection.  Bleeding.  A tear (perforation) in the esophagus, stomach, or duodenum.  Trouble breathing.  Excessive sweating.  Spasms of the larynx.  A slowed heartbeat.  Low blood pressure. What happens before the procedure?  Follow instructions from your health care provider about eating or drinking restrictions.  Ask your health care provider about:  Changing or stopping your regular medicines. This is especially important if you are taking diabetes medicines or blood thinners.  Taking medicines such as aspirin and ibuprofen. These medicines can thin your blood. Do not take these medicines before your procedure if your health care provider instructs you not to.  Plan to have someone take you home after the procedure.  If you wear dentures, be ready to remove them before the procedure. What happens during the procedure?  To reduce your risk of infection, your health care team will wash or sanitize their hands.  An IV tube will be put in a vein in your hand or arm. You will get medicines and  fluids through this tube.  You will be given one or more of the following:  A medicine to help you relax (sedative).  A medicine to numb the area (local anesthetic). This medicine may be sprayed into your throat. It will make you feel more comfortable and keep you from gagging or coughing during the procedure.  A medicine for pain.  A mouth guard may be placed in your mouth to protect your  teeth and to keep you from biting on the endoscope.  You will be asked to lie on your left side.  The endoscope will be lowered down your throat into your esophagus, stomach, and duodenum.  Air will be put into the endoscope. This will help your health care provider see better.  The lining of your esophagus, stomach, and duodenum will be examined.  Your health care provider may:  Take a tissue sample so it can be looked at in a lab (biopsy).  Remove growths.  Remove objects (foreign bodies) that are stuck.  Treat any bleeding with medicines or other devices that stop tissue from bleeding.  Widen (dilate) or stretch narrowed areas of your esophagus and stomach.  The endoscope will be taken out. The procedure may vary among health care providers and hospitals. What happens after the procedure?  Your blood pressure, heart rate, breathing rate, and blood oxygen level will be monitored often until the medicines you were given have worn off.  Do not eat or drink anything until the numbing medicine has worn off and your gag reflex has returned. This information is not intended to replace advice given to you by your health care provider. Make sure you discuss any questions you have with your health care provider. Document Released: 03/31/2005 Document Revised: 05/05/2016 Document Reviewed: 10/22/2015  2017 Elsevier Esophagogastroduodenoscopy, Care After Introduction Refer to this sheet in the next few weeks. These instructions provide you with information about caring for yourself after your procedure. Your health care provider may also give you more specific instructions. Your treatment has been planned according to current medical practices, but problems sometimes occur. Call your health care provider if you have any problems or questions after your procedure. What can I expect after the procedure? After the procedure, it is common to have:  A sore  throat.  Nausea.  Bloating.  Dizziness.  Fatigue. Follow these instructions at home:  Do not eat or drink anything until the numbing medicine (local anesthetic) has worn off and your gag reflex has returned. You will know that the local anesthetic has worn off when you can swallow comfortably.  Do not drive for 24 hours if you received a medicine to help you relax (sedative).  If your health care provider took a tissue sample for testing during the procedure, make sure to get your test results. This is your responsibility. Ask your health care provider or the department performing the test when your results will be ready.  Keep all follow-up visits as told by your health care provider. This is important. Contact a health care provider if:  You cannot stop coughing.  You are not urinating.  You are urinating less than usual. Get help right away if:  You have trouble swallowing.  You cannot eat or drink.  You have throat or chest pain that gets worse.  You are dizzy or light-headed.  You faint.  You have nausea or vomiting.  You have chills.  You have a fever.  You have severe abdominal pain.  You have  black, tarry, or bloody stools. This information is not intended to replace advice given to you by your health care provider. Make sure you discuss any questions you have with your health care provider. Document Released: 11/14/2012 Document Revised: 05/05/2016 Document Reviewed: 10/22/2015  2017 Elsevier  Colonoscopy, Adult A colonoscopy is an exam to look at the entire large intestine. During the exam, a lubricated, bendable tube is inserted into the anus and then passed into the rectum, colon, and other parts of the large intestine. A colonoscopy is often done as a part of normal colorectal screening or in response to certain symptoms, such as anemia, persistent diarrhea, abdominal pain, and blood in the stool. The exam can help screen for and diagnose medical  problems, including:  Tumors.  Polyps.  Inflammation.  Areas of bleeding. Tell a health care provider about:  Any allergies you have.  All medicines you are taking, including vitamins, herbs, eye drops, creams, and over-the-counter medicines.  Any problems you or family members have had with anesthetic medicines.  Any blood disorders you have.  Any surgeries you have had.  Any medical conditions you have.  Any problems you have had passing stool. What are the risks? Generally, this is a safe procedure. However, problems may occur, including:  Bleeding.  A tear in the intestine.  A reaction to medicines given during the exam.  Infection (rare). What happens before the procedure? Eating and drinking restrictions  Follow instructions from your health care provider about eating and drinking, which may include:  A few days before the procedure - follow a low-fiber diet. Avoid nuts, seeds, dried fruit, raw fruits, and vegetables.  1-3 days before the procedure - follow a clear liquid diet. Drink only clear liquids, such as clear broth or bouillon, black coffee or tea, clear juice, clear soft drinks or sports drinks, gelatin desert, and popsicles. Avoid any liquids that contain red or purple dye.  On the day of the procedure - do not eat or drink anything during the 2 hours before the procedure, or within the time period that your health care provider recommends. Bowel prep  If you were prescribed an oral bowel prep to clean out your colon:  Take it as told by your health care provider. Starting the day before your procedure, you will need to drink a large amount of medicated liquid. The liquid will cause you to have multiple loose stools until your stool is almost clear or light green.  If your skin or anus gets irritated from diarrhea, you may use these to relieve the irritation:  Medicated wipes, such as adult wet wipes with aloe and vitamin E.  A skin soothing-product  like petroleum jelly.  If you vomit while drinking the bowel prep, take a break for up to 60 minutes and then begin the bowel prep again. If vomiting continues and you cannot take the bowel prep without vomiting, call your health care provider. General instructions  Ask your health care provider about changing or stopping your regular medicines. This is especially important if you are taking diabetes medicines or blood thinners.  Plan to have someone take you home from the hospital or clinic. What happens during the procedure?  An IV tube may be inserted into one of your veins.  You will be given medicine to help you relax (sedative).  To reduce your risk of infection:  Your health care team will wash or sanitize their hands.  Your anal area will be washed with soap.  You will be asked to lie on your side with your knees bent.  Your health care provider will lubricate a long, thin, flexible tube. The tube will have a camera and a light on the end.  The tube will be inserted into your anus.  The tube will be gently eased through your rectum and colon.  Air will be delivered into your colon to keep it open. You may feel some pressure or cramping.  The camera will be used to take images during the procedure.  A small tissue sample may be removed from your body to be examined under a microscope (biopsy). If any potential problems are found, the tissue will be sent to a lab for testing.  If small polyps are found, your health care provider may remove them and have them checked for cancer cells.  The tube that was inserted into your anus will be slowly removed. The procedure may vary among health care providers and hospitals. What happens after the procedure?  Your blood pressure, heart rate, breathing rate, and blood oxygen level will be monitored until the medicines you were given have worn off.  Do not drive for 24 hours after the exam.  You may have a small amount of blood  in your stool.  You may pass gas and have mild abdominal cramping or bloating due to the air that was used to inflate your colon during the exam.  It is up to you to get the results of your procedure. Ask your health care provider, or the department performing the procedure, when your results will be ready. This information is not intended to replace advice given to you by your health care provider. Make sure you discuss any questions you have with your health care provider. Document Released: 11/25/2000 Document Revised: 06/17/2016 Document Reviewed: 02/09/2016 Elsevier Interactive Patient Education  2017 [employer]  Colonoscopy, Adult, Care After This sheet gives you information about how to care for yourself after your procedure. Your health care provider may also give you more specific instructions. If you have problems or questions, contact your health care provider. What can I expect after the procedure? After the procedure, it is common to have:  A small amount of blood in your stool for 24 hours after the procedure.  Some gas.  Mild abdominal cramping or bloating. Follow these instructions at home: General instructions  For the first 24 hours after the procedure:  Do not drive or use machinery.  Do not sign important documents.  Do not drink alcohol.  Do your regular daily activities at a slower pace than normal.  Eat soft, easy-to-digest foods.  Rest often.  Take over-the-counter or prescription medicines only as told by your health care provider.  It is up to you to get the results of your procedure. Ask your health care provider, or the department performing the procedure, when your results will be ready. Relieving cramping and bloating  Try walking around when you have cramps or feel bloated.  Apply heat to your abdomen as told by your health care provider. Use a heat source that your health care provider recommends, such as a moist heat pack or a heating  pad.  Place a towel between your skin and the heat source.  Leave the heat on for 20-30 minutes.  Remove the heat if your skin turns bright red. This is especially important if you are unable to feel pain, heat, or cold. You may have a greater risk of getting burned. Eating  and drinking  Drink enough fluid to keep your urine clear or pale yellow.  Resume your normal diet as instructed by your health care provider. Avoid heavy or fried foods that are hard to digest.  Avoid drinking alcohol for as long as instructed by your health care provider. Contact a health care provider if:  You have blood in your stool 2-3 days after the procedure. Get help right away if:  You have more than a small spotting of blood in your stool.  You pass large blood clots in your stool.  Your abdomen is swollen.  You have nausea or vomiting.  You have a fever.  You have increasing abdominal pain that is not relieved with medicine. This information is not intended to replace advice given to you by your health care provider. Make sure you discuss any questions you have with your health care provider. Document Released: 07/12/2004 Document Revised: 08/22/2016 Document Reviewed: 02/09/2016 Elsevier Interactive Patient Education  2017 [place] Anesthesia is a term that refers to techniques, procedures, and medicines that help a person stay safe and comfortable during a medical procedure. Monitored anesthesia care, or sedation, is one type of anesthesia. Your anesthesia specialist may recommend sedation if you will be having a procedure that does not require you to be unconscious, such as:  Cataract surgery.  A dental procedure.  A biopsy.  A colonoscopy. During the procedure, you may receive a medicine to help you relax (sedative). There are three levels of sedation:  Mild sedation. At this level, you may feel awake and relaxed. You will be able to follow  directions.  Moderate sedation. At this level, you will be sleepy. You may not remember the procedure.  Deep sedation. At this level, you will be asleep. You will not remember the procedure. The more medicine you are given, the deeper your level of sedation will be. Depending on how you respond to the procedure, the anesthesia specialist may change your level of sedation or the type of anesthesia to fit your needs. An anesthesia specialist will monitor you closely during the procedure. Let your health care provider know about:  Any allergies you have.  All medicines you are taking, including vitamins, herbs, eye drops, creams, and over-the-counter medicines.  Any use of steroids (by mouth or as a cream).  Any problems you or family members have had with sedatives and anesthetic medicines.  Any blood disorders you have.  Any surgeries you have had.  Any medical conditions you have, such as sleep apnea.  Whether you are pregnant or may be pregnant.  Any use of cigarettes, alcohol, or street drugs. What are the risks? Generally, this is a safe procedure. However, problems may occur, including:  Getting too much medicine (oversedation).  Nausea.  Allergic reaction to medicines.  Trouble breathing. If this happens, a breathing tube may be used to help with breathing. It will be removed when you are awake and breathing on your own.  Heart trouble.  Lung trouble. Before the procedure Staying hydrated  Follow instructions from your health care provider about hydration, which may include:  Up to 2 hours before the procedure - you may continue to drink clear liquids, such as water, clear fruit juice, black coffee, and plain tea. Eating and drinking restrictions  Follow instructions from your health care provider about eating and drinking, which may include:  8 hours before the procedure - stop eating heavy meals or foods such as meat, fried foods,  or fatty foods.  6 hours  before the procedure - stop eating light meals or foods, such as toast or cereal.  6 hours before the procedure - stop drinking milk or drinks that contain milk.  2 hours before the procedure - stop drinking clear liquids. Medicines  Ask your health care provider about:  Changing or stopping your regular medicines. This is especially important if you are taking diabetes medicines or blood thinners.  Taking medicines such as aspirin and ibuprofen. These medicines can thin your blood. Do not take these medicines before your procedure if your health care provider instructs you not to. Tests and exams  You will have a physical exam.  You may have blood tests done to show:  How well your kidneys and liver are working.  How well your blood can clot.  General instructions  Plan to have someone take you home from the hospital or clinic.  If you will be going home right after the procedure, plan to have someone with you for 24 hours. What happens during the procedure?  Your blood pressure, heart rate, breathing, level of pain and overall condition will be monitored.  An IV tube will be inserted into one of your veins.  Your anesthesia specialist will give you medicines as needed to keep you comfortable during the procedure. This may mean changing the level of sedation.  The procedure will be performed. After the procedure  Your blood pressure, heart rate, breathing rate, and blood oxygen level will be monitored until the medicines you were given have worn off.  Do not drive for 24 hours if you received a sedative.  You may:  Feel sleepy, clumsy, or nauseous.  Feel forgetful about what happened after the procedure.  Have a sore throat if you had a breathing tube during the procedure.  Vomit. This information is not intended to replace advice given to you by your health care provider. Make sure you discuss any questions you have with your health care provider. Document  Released: 08/24/2005 Document Revised: 05/06/2016 Document Reviewed: 03/20/2016 Elsevier Interactive Patient Education  2017 [place] POST-ANESTHESIA  IMMEDIATELY FOLLOWING SURGERY:  Do not drive or operate machinery for the first twenty four hours after surgery.  Do not make any important decisions for twenty four hours after surgery or while taking narcotic pain medications or sedatives.  If you develop intractable nausea and vomiting or a severe headache please notify your doctor immediately.  FOLLOW-UP:  Please make an appointment with your surgeon as instructed. You do not need to follow up with anesthesia unless specifically instructed to do so.  WOUND CARE INSTRUCTIONS (if applicable):  Keep a dry clean dressing on the anesthesia/puncture wound site if there is drainage.  Once the wound has quit draining you may leave it open to air.  Generally you should leave the bandage intact for twenty four hours unless there is drainage.  If the epidural site drains for more than 36-48 hours please call the anesthesia department.  QUESTIONS?:  Please feel free to call your physician or the hospital operator if you have any questions, and they will be happy to assist you.

## 2016-11-25 ENCOUNTER — Encounter (HOSPITAL_COMMUNITY): Payer: Self-pay

## 2016-11-25 ENCOUNTER — Encounter (HOSPITAL_COMMUNITY)
Admission: RE | Admit: 2016-11-25 | Discharge: 2016-11-25 | Disposition: A | Discharge status: Home / Self Care | Payer: Medicare Other | Source: Ambulatory Visit | Attending: Internal Medicine | Admitting: Internal Medicine

## 2016-11-25 DIAGNOSIS — R1011 Right upper quadrant pain: Secondary | ICD-10-CM | POA: Insufficient documentation

## 2016-11-25 DIAGNOSIS — Z8673 Personal history of transient ischemic attack (TIA), and cerebral infarction without residual deficits: Secondary | ICD-10-CM | POA: Insufficient documentation

## 2016-11-25 DIAGNOSIS — Z01812 Encounter for preprocedural laboratory examination: Secondary | ICD-10-CM | POA: Insufficient documentation

## 2016-11-25 DIAGNOSIS — R531 Weakness: Secondary | ICD-10-CM | POA: Diagnosis not present

## 2016-11-25 HISTORY — DX: Polyneuropathy, unspecified: G62.9

## 2016-11-25 HISTORY — DX: Gout, unspecified: M10.9

## 2016-11-25 LAB — BASIC METABOLIC PANEL
Anion gap: 11 (ref 5–15)
BUN: 22 mg/dL — ABNORMAL HIGH (ref 6–20)
CALCIUM: 10.6 mg/dL — AB (ref 8.9–10.3)
CO2: 19 mmol/L — AB (ref 22–32)
CREATININE: 1.6 mg/dL — AB (ref 0.61–1.24)
Chloride: 105 mmol/L (ref 101–111)
GFR calc Af Amer: 49 mL/min — ABNORMAL LOW (ref >60)
GFR calc non Af Amer: 42 mL/min — ABNORMAL LOW (ref >60)
GLUCOSE: 131 mg/dL — AB (ref 65–99)
Potassium: 4.9 mmol/L (ref 3.5–5.1)
Sodium: 135 mmol/L (ref 135–145)

## 2016-11-25 LAB — CBC WITH DIFFERENTIAL/PLATELET
Basophils Absolute: 0 10*3/uL (ref 0.0–0.1)
Basophils Relative: 1 %
Eosinophils Absolute: 0.8 10*3/uL — ABNORMAL HIGH (ref 0.0–0.7)
Eosinophils Relative: 10 %
HEMATOCRIT: 42.1 % (ref 39.0–52.0)
Hemoglobin: 13.4 g/dL (ref 13.0–17.0)
LYMPHS ABS: 1.4 10*3/uL (ref 0.7–4.0)
LYMPHS PCT: 17 %
MCH: 24.4 pg — AB (ref 26.0–34.0)
MCHC: 31.8 g/dL (ref 30.0–36.0)
MCV: 76.5 fL — AB (ref 78.0–100.0)
MONO ABS: 0.6 10*3/uL (ref 0.1–1.0)
MONOS PCT: 7 %
NEUTROS ABS: 5.3 10*3/uL (ref 1.7–7.7)
Neutrophils Relative %: 66 %
Platelets: 215 10*3/uL (ref 150–400)
RBC: 5.5 MIL/uL (ref 4.22–5.81)
RDW: 17.7 % — AB (ref 11.5–15.5)
WBC: 8.1 10*3/uL (ref 4.0–10.5)

## 2016-11-25 NOTE — Progress Notes (Signed)
   11/25/16 1300  OBSTRUCTIVE SLEEP APNEA  Have you ever been diagnosed with sleep apnea through a sleep study? No  Do you snore loudly (loud enough to be heard through closed doors)?  1  Do you often feel tired, fatigued, or sleepy during the daytime (such as falling asleep during driving or talking to someone)? 0  Has anyone observed you stop breathing during your sleep? 0  Do you have, or are you being treated for high blood pressure? 1  BMI more than 35 kg/m2? 1  Age > 50 (1-yes) 1  Neck circumference greater than:Male 16 inches or larger, Male 17inches or larger? 0  Male Gender (Yes=1) 1  Obstructive Sleep Apnea Score 5

## 2016-11-29 ENCOUNTER — Other Ambulatory Visit: Payer: Self-pay | Admitting: Family Medicine

## 2016-12-01 ENCOUNTER — Ambulatory Visit (HOSPITAL_COMMUNITY)
Admission: RE | Admit: 2016-12-01 | Discharge: 2016-12-01 | Disposition: A | Discharge status: Home / Self Care | Payer: Medicare Other | Source: Ambulatory Visit | Attending: Internal Medicine | Admitting: Internal Medicine

## 2016-12-01 ENCOUNTER — Encounter (HOSPITAL_COMMUNITY)
Admission: RE | Disposition: A | Discharge status: Home / Self Care | Payer: Self-pay | Source: Ambulatory Visit | Attending: Internal Medicine

## 2016-12-01 ENCOUNTER — Ambulatory Visit (HOSPITAL_COMMUNITY): Payer: Medicare Other | Admitting: Anesthesiology

## 2016-12-01 ENCOUNTER — Encounter (HOSPITAL_COMMUNITY): Payer: Self-pay | Admitting: *Deleted

## 2016-12-01 DIAGNOSIS — K22719 Barrett's esophagus with dysplasia, unspecified: Secondary | ICD-10-CM

## 2016-12-01 DIAGNOSIS — Z9049 Acquired absence of other specified parts of digestive tract: Secondary | ICD-10-CM | POA: Diagnosis not present

## 2016-12-01 DIAGNOSIS — G8929 Other chronic pain: Secondary | ICD-10-CM | POA: Diagnosis not present

## 2016-12-01 DIAGNOSIS — N400 Enlarged prostate without lower urinary tract symptoms: Secondary | ICD-10-CM | POA: Diagnosis not present

## 2016-12-01 DIAGNOSIS — K635 Polyp of colon: Secondary | ICD-10-CM | POA: Diagnosis not present

## 2016-12-01 DIAGNOSIS — K2960 Other gastritis without bleeding: Secondary | ICD-10-CM | POA: Insufficient documentation

## 2016-12-01 DIAGNOSIS — E785 Hyperlipidemia, unspecified: Secondary | ICD-10-CM | POA: Diagnosis not present

## 2016-12-01 DIAGNOSIS — Z5309 Procedure and treatment not carried out because of other contraindication: Secondary | ICD-10-CM | POA: Diagnosis not present

## 2016-12-01 DIAGNOSIS — K2270 Barrett's esophagus without dysplasia: Secondary | ICD-10-CM | POA: Insufficient documentation

## 2016-12-01 DIAGNOSIS — Z87891 Personal history of nicotine dependence: Secondary | ICD-10-CM | POA: Diagnosis not present

## 2016-12-01 DIAGNOSIS — Z538 Procedure and treatment not carried out for other reasons: Secondary | ICD-10-CM | POA: Diagnosis not present

## 2016-12-01 DIAGNOSIS — H409 Unspecified glaucoma: Secondary | ICD-10-CM | POA: Insufficient documentation

## 2016-12-01 DIAGNOSIS — M109 Gout, unspecified: Secondary | ICD-10-CM | POA: Insufficient documentation

## 2016-12-01 DIAGNOSIS — Z8249 Family history of ischemic heart disease and other diseases of the circulatory system: Secondary | ICD-10-CM | POA: Diagnosis not present

## 2016-12-01 DIAGNOSIS — J45909 Unspecified asthma, uncomplicated: Secondary | ICD-10-CM | POA: Insufficient documentation

## 2016-12-01 DIAGNOSIS — Z860100 Personal history of colon polyps, unspecified: Secondary | ICD-10-CM

## 2016-12-01 DIAGNOSIS — Z1211 Encounter for screening for malignant neoplasm of colon: Secondary | ICD-10-CM | POA: Diagnosis not present

## 2016-12-01 DIAGNOSIS — I739 Peripheral vascular disease, unspecified: Secondary | ICD-10-CM | POA: Insufficient documentation

## 2016-12-01 DIAGNOSIS — K449 Diaphragmatic hernia without obstruction or gangrene: Secondary | ICD-10-CM | POA: Insufficient documentation

## 2016-12-01 DIAGNOSIS — K3189 Other diseases of stomach and duodenum: Secondary | ICD-10-CM | POA: Diagnosis not present

## 2016-12-01 DIAGNOSIS — K439 Ventral hernia without obstruction or gangrene: Secondary | ICD-10-CM | POA: Insufficient documentation

## 2016-12-01 DIAGNOSIS — K5730 Diverticulosis of large intestine without perforation or abscess without bleeding: Secondary | ICD-10-CM | POA: Insufficient documentation

## 2016-12-01 DIAGNOSIS — I10 Essential (primary) hypertension: Secondary | ICD-10-CM | POA: Diagnosis not present

## 2016-12-01 DIAGNOSIS — K219 Gastro-esophageal reflux disease without esophagitis: Secondary | ICD-10-CM | POA: Insufficient documentation

## 2016-12-01 DIAGNOSIS — Z85818 Personal history of malignant neoplasm of other sites of lip, oral cavity, and pharynx: Secondary | ICD-10-CM | POA: Diagnosis not present

## 2016-12-01 DIAGNOSIS — Z79899 Other long term (current) drug therapy: Secondary | ICD-10-CM | POA: Diagnosis not present

## 2016-12-01 DIAGNOSIS — Z8673 Personal history of transient ischemic attack (TIA), and cerebral infarction without residual deficits: Secondary | ICD-10-CM | POA: Diagnosis not present

## 2016-12-01 DIAGNOSIS — Z794 Long term (current) use of insulin: Secondary | ICD-10-CM | POA: Diagnosis not present

## 2016-12-01 DIAGNOSIS — E119 Type 2 diabetes mellitus without complications: Secondary | ICD-10-CM | POA: Insufficient documentation

## 2016-12-01 DIAGNOSIS — N289 Disorder of kidney and ureter, unspecified: Secondary | ICD-10-CM | POA: Diagnosis not present

## 2016-12-01 DIAGNOSIS — R1011 Right upper quadrant pain: Secondary | ICD-10-CM | POA: Diagnosis not present

## 2016-12-01 HISTORY — PX: BIOPSY: SHX5522

## 2016-12-01 HISTORY — PX: ESOPHAGOGASTRODUODENOSCOPY (EGD) WITH PROPOFOL: SHX5813

## 2016-12-01 HISTORY — PX: COLONOSCOPY WITH PROPOFOL: SHX5780

## 2016-12-01 LAB — GLUCOSE, CAPILLARY
GLUCOSE-CAPILLARY: 93 mg/dL (ref 65–99)
Glucose-Capillary: 127 mg/dL — ABNORMAL HIGH (ref 65–99)

## 2016-12-01 SURGERY — COLONOSCOPY WITH PROPOFOL
Anesthesia: Monitor Anesthesia Care

## 2016-12-01 MED ORDER — LACTATED RINGERS IV SOLN
INTRAVENOUS | Status: DC
  Administered 2016-12-01: 12:00:00 via INTRAVENOUS

## 2016-12-01 MED ORDER — MIDAZOLAM HCL 2 MG/2ML IJ SOLN
INTRAMUSCULAR | Status: AC
  Filled 2016-12-01: qty 2

## 2016-12-01 MED ORDER — LIDOCAINE VISCOUS 2 % MT SOLN
OROMUCOSAL | Status: AC
  Filled 2016-12-01: qty 15

## 2016-12-01 MED ORDER — CHLORHEXIDINE GLUCONATE CLOTH 2 % EX PADS: 6.0000 | MEDICATED_PAD | Freq: Once | CUTANEOUS | Status: DC

## 2016-12-01 MED ORDER — PROPOFOL 10 MG/ML IV BOLUS
INTRAVENOUS | Status: AC
  Filled 2016-12-01: qty 40

## 2016-12-01 MED ORDER — LIDOCAINE VISCOUS 2 % MT SOLN
5.0000 mL | Freq: Two times a day (BID) | OROMUCOSAL | Status: DC
  Administered 2016-12-01: 5 mL via OROMUCOSAL

## 2016-12-01 MED ORDER — FENTANYL CITRATE (PF) 100 MCG/2ML IJ SOLN
25.0000 ug | INTRAMUSCULAR | Status: DC | PRN
  Administered 2016-12-01: 25 ug via INTRAVENOUS

## 2016-12-01 MED ORDER — LIDOCAINE HCL (PF) 1 % IJ SOLN
INTRAMUSCULAR | Status: AC
  Filled 2016-12-01: qty 5

## 2016-12-01 MED ORDER — FENTANYL CITRATE (PF) 100 MCG/2ML IJ SOLN
INTRAMUSCULAR | Status: AC
  Filled 2016-12-01: qty 2

## 2016-12-01 MED ORDER — PROPOFOL 500 MG/50ML IV EMUL
INTRAVENOUS | Status: DC | PRN
  Administered 2016-12-01: 50 ug/kg/min via INTRAVENOUS

## 2016-12-01 MED ORDER — PROPOFOL 10 MG/ML IV BOLUS
INTRAVENOUS | Status: DC | PRN
  Administered 2016-12-01 (×4): 12.5 mg via INTRAVENOUS

## 2016-12-01 MED ORDER — MIDAZOLAM HCL 2 MG/2ML IJ SOLN
1.0000 mg | INTRAMUSCULAR | Status: DC | PRN
  Administered 2016-12-01: 2 mg via INTRAVENOUS
  Filled 2016-12-01: qty 2

## 2016-12-01 NOTE — Interval H&P Note (Signed)
History and Physical Interval Note:  12/01/2016 12:49 PM  [name]  has presented today for surgery, with the diagnosis of RUQ PAIN/COLON POLYPS/BARRETT'S  The various methods of treatment have been discussed with the patient and family. After consideration of risks, benefits and other options for treatment, the patient has consented to  Procedure(s) with comments: COLONOSCOPY WITH PROPOFOL (N/A) - 1230  ESOPHAGOGASTRODUODENOSCOPY (EGD) WITH PROPOFOL (N/A) as a surgical intervention .  The patient's history has been reviewed, patient examined, no change in status, stable for surgery.  I have reviewed the patient's chart and labs.  Questions were answered to the patient's satisfaction.     [name]  No change; EGD and TCS per plan.  The risks, benefits, limitations, imponderables and alternatives regarding both EGD and colonoscopy have been reviewed with the patient. Questions have been answered. All parties agreeable.

## 2016-12-01 NOTE — Anesthesia Preprocedure Evaluation (Signed)
Anesthesia Evaluation  Patient identified by MRN, date of birth, ID band Patient awake    Reviewed: Allergy & Precautions, H&P , NPO status , Patient's Chart, lab work & pertinent test results, reviewed documented beta blocker date and time   Airway Mallampati: III  TM Distance: >3 FB Neck ROM: full    Dental   Pulmonary asthma , former smoker,    breath sounds clear to auscultation       Cardiovascular hypertension, On Medications and On Home Beta Blockers + Peripheral Vascular Disease   Rhythm:regular     Neuro/Psych  Neuromuscular disease CVA, Residual Symptoms negative psych ROS   GI/Hepatic hiatal hernia, GERD  Medicated and Controlled,(+) Hepatitis -  Endo/Other  diabetes, Insulin DependentHypothyroidism   Renal/GU Renal disease  negative genitourinary   Musculoskeletal   Abdominal   Peds  Hematology negative hematology ROS (+)   Anesthesia Other Findings See surgeon's H&P   Reproductive/Obstetrics negative OB ROS                             Anesthesia Physical Anesthesia Plan  ASA: III  Anesthesia Plan: MAC   Post-op Pain Management:    Induction: Intravenous  Airway Management Planned: Simple Face Mask  Additional Equipment:   Intra-op Plan:   Post-operative Plan:   Informed Consent: I have reviewed the patients History and Physical, chart, labs and discussed the procedure including the risks, benefits and alternatives for the proposed anesthesia with the patient or authorized representative who has indicated his/her understanding and acceptance.     Plan Discussed with:   Anesthesia Plan Comments:         Anesthesia Quick Evaluation

## 2016-12-01 NOTE — H&P (View-Only) (Signed)
Primary Care Physician:  Vic Blackbird, MD  Primary Gastroenterologist:  [place] Cornea, MD   Chief Complaint  Patient presents with  . Abdominal Pain    right side pain    HPI:  [name] is a 70 y.o. male here for further evaluation of chronic right-sided abdominal pain. Patient was last seen in 2012 for the same symptoms. He has a history of Barrett's esophagus without dysplasia and tubular adenoma removed from his colon (poor prep). Overdue for surveillance exam for both of these. He has had vague right-sided abdominal pain for years. Sometimes pain is severe and debilitating. Gallbladder removed remotely. Patient has had episodes of elevated transaminases, peripheral eosinophilia in the past which responded to prednisone, ? Eosinophilic hepatitis. January 2009 he had a liver biopsy showing steatohepatitis. Workup also included MRCP in January 2009 for abnormal LFTs and right upper quadrant abdominal pain which was normal. He has had negative viral markers, ANA, AMA, ASMA. Normal alpha-1 antitrypsin level, ceruloplasmin, iron.  Patient complains of persistent right upper quadrant pain. Pain is constant. Some days worse than others. He believes is worse with meals. No vomiting. Has a bowel movement both days. No melena, rectal bleeding. He is really not sure what he weighs. He has chronic lower extremity edema, minimal ambulation. No heartburn, dysphagia.    Current Outpatient Prescriptions  Medication Sig Dispense Refill  . allopurinol (ZYLOPRIM) 300 MG tablet TAKE 1 TABLET BY MOUTH EVERY DAY 90 tablet 0  . amLODipine (NORVASC) 10 MG tablet TAKE 1 TABLET BY MOUTH DAILY 90 tablet 0  . cloNIDine (CATAPRES) 0.2 MG tablet TAKE 1 TABLET BY MOUTH THREE TIMES DAILY 270 tablet 0  . clotrimazole-betamethasone (LOTRISONE) cream Apply 1 application topically 2 (two) times daily. 45 g 1  . furosemide (LASIX) 40 MG tablet TAKE 1 TABLET BY MOUTH DAILY 90 tablet 0  . gabapentin (NEURONTIN) 300 MG  capsule TAKE 2 CAPSULES(600 MG) BY MOUTH THREE TIMES DAILY 540 capsule 0  . Insulin Glargine (LANTUS SOLOSTAR) 100 UNIT/ML Solostar Pen Inject 50 Units into the skin at bedtime. 15 mL 2  . insulin lispro (HUMALOG KWIKPEN) 100 UNIT/ML KiwkPen Inject 0.05-0.11 mLs (5-11 Units total) into the skin 3 (three) times daily with meals. 15 mL 2  . levothyroxine (SYNTHROID, LEVOTHROID) 75 MCG tablet Take 1 tablet (75 mcg total) by mouth daily. 30 tablet 3  . linagliptin (TRADJENTA) 5 MG TABS tablet Take 1 tablet by mouth  daily 90 tablet 2  . lisinopril (PRINIVIL,ZESTRIL) 20 MG tablet TAKE 1 TABLET BY MOUTH DAILY 90 tablet 0  . metFORMIN (GLUCOPHAGE) 500 MG tablet TAKE 1 TABLET BY MOUTH TWICE DAILY 180 tablet 0  . metoprolol succinate (TOPROL-XL) 50 MG 24 hr tablet TAKE 1 TABLET BY MOUTH DAILY WITH OR IMMEDIATELY FOLLOWING A MEAL 90 tablet 1  . oxyCODONE-acetaminophen (PERCOCET) 7.5-325 MG tablet Take 1 tablet by mouth every 4 (four) hours as needed. 180 tablet 0  . pantoprazole (PROTONIX) 40 MG tablet TAKE 1 TABLET BY MOUTH DAILY 90 tablet 0  . potassium chloride SA (K-DUR,KLOR-CON) 20 MEQ tablet TAKE 1 TABLET(20 MEQ) BY MOUTH DAILY 30 tablet 2  . simvastatin (ZOCOR) 20 MG tablet TAKE 1 TABLET BY MOUTH EVERY NIGHT AT BEDTIME 90 tablet 0  . tamsulosin (FLOMAX) 0.4 MG CAPS capsule TAKE 1 CAPSULE BY MOUTH DAILY 90 capsule 0  . tiZANidine (ZANAFLEX) 2 MG tablet TAKE 1 TABLET BY MOUTH TWICE DAILY AS NEEDED 180 tablet 0  . ONETOUCH VERIO test strip TEST  FOUR TIMES DAILY 150 each 3   No current facility-administered medications for this visit.     Allergies as of 11/09/2016  . (No Known Allergies)    Past Medical History:  Diagnosis Date  . Arthritis   . Asthma    "hx of"  . Barrett's esophagus    EGD 03/23/2011 & EGD 2/09 bx proven  . BPH (benign prostatic hyperplasia)   . Cancer of parotid gland ([place]) 11/23/12   Adenocarcinoma  . Chronic abdominal pain   . Colon polyp 03/23/2011   tubular adenoma,  Dr. Gala [facility]  . 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 ([place]) 07/15/2011  . CVA (cerebral infarction) 1998   right sided deficit  . Diverticulosis    TCS 03/23/11 pancolonic diverticula &TCS 5/08, pancolonic diverticula  . DM (diabetes mellitus) ([place])   . Edema of lower extremity 12/21/12   bilateral   . Esotropia of left eye   . GERD (gastroesophageal reflux disease)   . Glaucoma (increased eye pressure)   . Gout   . Hemorrhagic colitis 06/06/2012.  [name] Hemorrhoids, internal 03/23/2011   tcs by Dr. Gala Romney  . Hepatitis    esosiniphilic, tx with prednisone  . Hiatal hernia   . HTN (hypertension)   . Hx of radiation therapy 1974   right base of skull area-lymphoma  . Hyperlipidemia   . Lower facial weakness    Right  . Lymphoma ([place]) 1974   XRT at [facility], right base of skull area  . Renal insufficiency   . Steatohepatitis    liver biopsy 2009  . Stroke [facility]) 1998   right hemiparesis/plegia    Past Surgical History:  Procedure Laterality Date  . CHOLECYSTECTOMY    . COLONOSCOPY  03/23/11   Dr. Gala Romney  pancolonic diverticula, hemorrhoids, tubular adenoma.. next tcs 03/2016  . ESOPHAGOGASTRODUODENOSCOPY  02/05/08   goblet cell metaplasia/negative for H.pylori  . ESOPHAGOGASTRODUODENOSCOPY  03/23/11   Dr. Gala Romney, barretts, hiatal hernia  . MASS BIOPSY  11/01/2012   Procedure: NECK MASS BIOPSY;  Surgeon: Ascencion Dike, MD;  Location: [facility];  Service: ENT;  Laterality: Right;  Excisional Bx Right Neck Mass; attempted external jugular cutdown of left side  . PAROTIDECTOMY  11/24/2012   Procedure: PAROTIDECTOMY;  Surgeon: Ascencion Dike, MD;  Location: [facility];  Service: ENT;  Laterality: N/A;  Total parotidectomy  . PLEURECTOMY    . right lymph node removal    . Right video-assisted thoracic surgery, pleurectomy, and pleurodesis  2011    Family History  Problem Relation Age of Onset  . Heart failure Mother   . Heart failure Father   . Heart failure Sister   . Heart failure Son      Social History   Social History  . Marital status: Married    Spouse name: N/A  . Number of children: 1  . Years of education: N/A   Occupational History  . disabled    Social History Main Topics  . Smoking status: Former Smoker    Packs/day: 3.00    Types: Cigarettes    Quit date: 03/01/1997  . Smokeless tobacco: Never Used  . Alcohol use No  . Drug use: No  . Sexual activity: No   Other Topics Concern  . Not on file   Social History Narrative  . No narrative on file      ROS:  General: Negative for anorexia, weight loss, fever, chills, fatigue, +weakness. Eyes: Negative for vision changes.  ENT: Negative for hoarseness, difficulty swallowing , nasal congestion. CV: Negative for chest pain, angina, palpitations, dyspnea on exertion, +chronic peripheral edema.  Respiratory: Negative for dyspnea at rest, dyspnea on exertion, cough, sputum, wheezing.  GI: See history of present illness. GU:  Negative for dysuria, hematuria, urinary incontinence, urinary frequency, nocturnal urination.  MS: Negative for joint pain, low back pain.  Derm: Negative for rash or itching.  Neuro: Positive for weakness, abnormal sensation. No seizure, frequent headaches, memory loss, confusion.  Psych: Negative for anxiety, depression, suicidal ideation, hallucinations.  Endo: Negative for unusual weight change.  Heme: Negative for bruising or bleeding. Allergy: Negative for rash or hives.    Physical Examination:  BP 110/75   Pulse 83   Temp 98.5 F (36.9 C) (Oral)   Ht 6' (1.829 m)    General: chronically ill-appearing male in NAD. In a manual wheelchair. accompanied by his wife.   Head: Normocephalic, atraumatic.   Eyes: Conjunctiva pink, no icterus. Mouth: Oropharyngeal mucosa moist and pink , no lesions erythema or exudate. Neck: Supple without thyromegaly, masses, or lymphadenopathy.  Lungs: Clear to auscultation bilaterally.  Heart: Regular rate and rhythm, no murmurs  rubs or gallops.  Abdomen: Bowel sounds are normal, mild to moderate right upper quadrant tenderness, nondistended, no hepatosplenomegaly or masses, no abdominal bruits or    hernia , no rebound or guarding.  Difficult to examine in wheelchair Rectal: not performed Extremities: 2+pitting lower extremity edema. No clubbing or deformities.  Neuro: Alert and oriented x 4 , right upper ext weakness Skin: Warm and dry, no rash or jaundice.   Psych: Alert and cooperative, normal mood and affect.  Labs: Lab Results  Component Value Date   CREATININE 1.35 (H) 08/09/2016   BUN 19 08/09/2016   NA 138 08/09/2016   K 4.5 08/09/2016   CL 104 08/09/2016   CO2 23 08/09/2016   Lab Results  Component Value Date   ALT 14 08/09/2016   AST 15 08/09/2016   ALKPHOS 76 08/09/2016   BILITOT 0.3 08/09/2016   Lab Results  Component Value Date   WBC 14.5 (H) 05/23/2016   HGB 13.7 05/23/2016   HCT 43.0 05/23/2016   MCV 74.7 (L) 05/23/2016   PLT 212 05/23/2016    Lab Results  Component Value Date   TSH 2.59 08/09/2016   Hgb A1C 6.8  Imaging Studies:   Study Result   CLINICAL DATA:  Patient developed right-sided abdominal pain and chills while eating dinner. Nausea. History of diabetes.  EXAM: CT ABDOMEN AND PELVIS WITH CONTRAST  TECHNIQUE: Multidetector CT imaging of the abdomen and pelvis was performed using the standard protocol following bolus administration of intravenous contrast.  CONTRAST:  16m ISOVUE-300 IOPAMIDOL (ISOVUE-300) INJECTION 61%  COMPARISON:  06/20/2014  FINDINGS: Interstitial fibrosis and scattered emphysematous changes in the lung bases. Small esophageal hiatal hernia. Coronary artery calcifications.  Surgical absence of the gallbladder. No bile duct dilatation. Bilateral renal cysts are similar to prior study. No hydronephrosis. The liver, spleen, pancreas, adrenal glands, abdominal aorta, inferior vena cava, and retroperitoneal lymph nodes  are unremarkable. Stomach, small bowel, and colon are not abnormally distended. No free air or free fluid in the abdomen. Ventral periumbilical abdominal wall hernias containing fat. No bowel herniation.  Pelvis: Prostate gland is enlarged, measuring 5.5 cm diameter. Bladder wall is not thickened. No free or loculated pelvic fluid collections. No pelvic mass or lymphadenopathy. Diverticulosis of the sigmoid colon without evidence of diverticulitis. The appendix is normal. Mixed  lytic and sclerotic bone lesion within the L3 vertebral body with Schmorl's node at the inferior endplate. Appearance is unchanged since previous study. Mild degenerative changes in the spine.  IMPRESSION: No acute abnormality demonstrated within the abdomen or pelvis. Periumbilical abdominal wall hernia containing fat. Prostate gland enlargement. Unchanged appearance of vague mixed lytic and sclerotic lesion in the L3 vertebra.   Electronically Signed   By: Lucienne Capers M.D.   On: 05/23/2016 23:38

## 2016-12-01 NOTE — Transfer of Care (Signed)
Immediate Anesthesia Transfer of Care Note  Patient: [name]  Procedure(s) Performed: Procedure(s) with comments: COLONOSCOPY WITH PROPOFOL (N/A) - 1230  ESOPHAGOGASTRODUODENOSCOPY (EGD) WITH PROPOFOL (N/A) BIOPSY - duodenum, gastric, esophagus  Patient Location: PACU  Anesthesia Type:MAC  Level of Consciousness: awake and patient cooperative  Airway & Oxygen Therapy: Patient Spontanous Breathing and non-rebreather face mask  Post-op Assessment: Report given to RN and Post -op Vital signs reviewed and stable  Post vital signs: Reviewed and stable  Last Vitals:  Vitals:   12/01/16 1250 12/01/16 1255  BP: 125/83 121/82  Pulse:    Resp: (!) 0 15  Temp:      Last Pain:  Vitals:   12/01/16 1108  TempSrc: Oral         Complications: No apparent anesthesia complications

## 2016-12-01 NOTE — Anesthesia Procedure Notes (Signed)
Procedure Name: MAC Date/Time: 12/01/2016 12:59 PM Performed by: Vista Deck Pre-anesthesia Checklist: Patient identified, Emergency Drugs available, Suction available, Timeout performed and Patient being monitored Patient Re-evaluated:Patient Re-evaluated prior to inductionOxygen Delivery Method: Non-rebreather mask

## 2016-12-01 NOTE — Op Note (Signed)
[facility] Patient Name: [name] Procedure Date: 12/01/2016 1:01 PM MRN: [id number] Date of Birth: [birth year] Attending MD: Norvel Richards , MD CSN: [id number] Age: 70 Admit Type: Outpatient Procedure:                Upper GI endoscopy with esophageal, gastric and                            duodenal biopsy Indications:              Abdominal pain in the right upper quadrant;                            elevated peripheral eosinophil count. Providers:                Norvel Richards, MD, Jeanann Lewandowsky. Sharon Seller, RN,                            Isabella Stalling, Technician Referring MD:              Medicines:                Propofol per Anesthesia Complications:            No immediate complications. Estimated Blood Loss:     Estimated blood loss was minimal. Procedure:                Pre-Anesthesia Assessment:                           - Prior to the procedure, a History and Physical                            was performed, and patient medications and                            allergies were reviewed. The patient's tolerance of                            previous anesthesia was also reviewed. The risks                            and benefits of the procedure and the sedation                            options and risks were discussed with the patient.                            All questions were answered, and informed consent                            was obtained. Prior Anticoagulants: The patient has                            taken no previous anticoagulant or antiplatelet  agents. ASA Grade Assessment: III - A patient with                            severe systemic disease. After reviewing the risks                            and benefits, the patient was deemed in                            satisfactory condition to undergo the procedure.                           After obtaining informed consent, the endoscope was        passed under direct vision. Throughout the                            procedure, the patient's blood pressure, pulse, and                            oxygen saturations were monitored continuously. The                            [id number] ([id number]) scope was introduced through the                            and advanced to the second part of duodenum. The                            upper GI endoscopy was accomplished without                            difficulty. The patient tolerated the procedure                            well. Scope In: 1:45:32 PM Scope Out: 1:55:11 PM Total Procedure Duration: 0 hours 9 minutes 39 seconds  Findings:      The esophagus and gastroesophageal junction were examined with white       light. Barrett's esophagus was present. Skinny tongue extending 2 cm       from the GE junction proximally. No nodularity. no esophagitis.      Large amount of retained gastric contents present precluded comption of       the stomach. Mucosal changes were found in the stomach. Erosions and       somewhat scalloped appearing mucosa present. No ulcer or infiltrating       process. Patent pylorus.      The duodenal bulb and second portion of the duodenum were normal.       Biopsies of abnormal appearing esophagus, stomach and duodenum taken. Impression:               - Barrett's esophagus.                           - Abnormal stomach of uncertain significance.  Retained gastric reported - abnormal with last                            intake of solid food reported nearly 48 hours prior                            to procedure.                           - Normal duodenal bulb and second portion of the                            duodenum.                           biopsies taken as described above.. Moderate Sedation:      Moderate (conscious) sedation was personally administered by an       anesthesia professional. The following parameters were  monitored: oxygen       saturation, heart rate, blood pressure, respiratory rate, EKG, adequacy       of pulmonary ventilation, and response to care. Total physician       intraservice time was 52 minutes. Recommendation:           - Patient has a contact number available for                            emergencies. The signs and symptoms of potential                            delayed complications were discussed with the                            patient. Return to normal activities tomorrow.                            Written discharge instructions were provided to the                            patient.                           - Patient has a contact number available for                            emergencies. The signs and symptoms of potential                            delayed complications were discussed with the                            patient. Return to normal activities tomorrow.                            Written discharge instructions were provided to the  patient.                           - Resume previous diet.                           - Continue present medications.                           - No repeat upper endoscopy.                           - Return to GI office in 6 weeks. See colonoscopy                            report. Procedure Code(s):        --- Professional ---                           [phone], Esophagogastroduodenoscopy, flexible,                            transoral; diagnostic, including collection of                            specimen(s) by brushing or washing, when performed                            (separate procedure) Diagnosis Code(s):        --- Professional ---                           K22.70, Barrett's esophagus without dysplasia                           K31.89, Other diseases of stomach and duodenum                           R10.11, Right upper quadrant pain CPT copyright 2016 American Medical Association.  All rights reserved. The codes documented in this report are preliminary and upon coder review may  be revised to meet current compliance requirements. Cristopher Estimable. Jerimie Mancuso, MD Norvel Richards, MD 12/01/2016 2:12:42 PM This report has been signed electronically. Number of Addenda: 0

## 2016-12-01 NOTE — Discharge Instructions (Signed)
Colonoscopy Discharge Instructions  Read the instructions outlined below and refer to this sheet in the next few weeks. These discharge instructions provide you with general information on caring for yourself after you leave the hospital. Your doctor may also give you specific instructions. While your treatment has been planned according to the most current medical practices available, unavoidable complications occasionally occur. If you have any problems or questions after discharge, call Dr. Gala Romney at [phone]. ACTIVITY  You may resume your regular activity, but move at a slower pace for the next 24 hours.   Take frequent rest periods for the next 24 hours.   Walking will help get rid of the air and reduce the bloated feeling in your belly (abdomen).   No driving for 24 hours (because of the medicine (anesthesia) used during the test).    Do not sign any important legal documents or operate any machinery for 24 hours (because of the anesthesia used during the test).  NUTRITION  Drink plenty of fluids.   You may resume your normal diet as instructed by your doctor.   Begin with a light meal and progress to your normal diet. Heavy or fried foods are harder to digest and may make you feel sick to your stomach (nauseated).   Avoid alcoholic beverages for 24 hours or as instructed.  MEDICATIONS  You may resume your normal medications unless your doctor tells you otherwise.  WHAT YOU CAN EXPECT TODAY  Some feelings of bloating in the abdomen.   Passage of more gas than usual.   Spotting of blood in your stool or on the toilet paper.  IF YOU HAD POLYPS REMOVED DURING THE COLONOSCOPY:  No aspirin products for 7 days or as instructed.   No alcohol for 7 days or as instructed.   Eat a soft diet for the next 24 hours.  FINDING OUT THE RESULTS OF YOUR TEST Not all test results are available during your visit. If your test results are not back during the visit, make an appointment  with your caregiver to find out the results. Do not assume everything is normal if you have not heard from your caregiver or the medical facility. It is important for you to follow up on all of your test results.  SEEK IMMEDIATE MEDICAL ATTENTION IF:  You have more than a spotting of blood in your stool.   Your belly is swollen (abdominal distention).   You are nauseated or vomiting.   You have a temperature over 101.   You have abdominal pain or discomfort that is severe or gets worse throughout the day.     EGD Discharge instructions Please read the instructions outlined below and refer to this sheet in the next few weeks. These discharge instructions provide you with general information on caring for yourself after you leave the hospital. Your doctor may also give you specific instructions. While your treatment has been planned according to the most current medical practices available, unavoidable complications occasionally occur. If you have any problems or questions after discharge, please call your doctor. ACTIVITY  You may resume your regular activity but move at a slower pace for the next 24 hours.   Take frequent rest periods for the next 24 hours.   Walking will help expel (get rid of) the air and reduce the bloated feeling in your abdomen.   No driving for 24 hours (because of the anesthesia (medicine) used during the test).   You may shower.   Do not  sign any important legal documents or operate any machinery for 24 hours (because of the anesthesia used during the test).  NUTRITION  Drink plenty of fluids.   You may resume your normal diet.   Begin with a light meal and progress to your normal diet.   Avoid alcoholic beverages for 24 hours or as instructed by your caregiver.  MEDICATIONS  You may resume your normal medications unless your caregiver tells you otherwise.  WHAT YOU CAN EXPECT TODAY  You may experience abdominal discomfort such as a feeling of  fullness or gas pains.  FOLLOW-UP  Your doctor will discuss the results of your test with you.  SEEK IMMEDIATE MEDICAL ATTENTION IF ANY OF THE FOLLOWING OCCUR:  Excessive nausea (feeling sick to your stomach) and/or vomiting.   Severe abdominal pain and distention (swelling).   Trouble swallowing.   Temperature over 101 F (37.8 C).   Rectal bleeding or vomiting of blood.     Your colon was not prepared for colonoscopy today. Retained stool present.  EGD done. Biopsies taken.  Further recommendations to follow pending review of pathology report

## 2016-12-02 NOTE — Op Note (Signed)
NAME:  LAMARR, FEENSTRA NO.:  [id number]  MEDICAL RECORD NO.:  [id number]  LOCATION:                                FACILITY:  [facility]  PHYSICIAN:  [facility]. [facility] Cornea, MD [place]:  Oct 12, 1946  DATE OF PROCEDURE:  12/01/2016 DATE OF DISCHARGE:  12/01/2016                              OPERATIVE REPORT   PROCEDURE:  Attempted colonoscopy.  INDICATIONS FOR PROCEDURE:  A 70 year old gentleman with history of colonic polyps; here for surveillance examination.  Risks, benefits, limitations, alternatives have been reviewed.  Questions answered. Please see EGD report.  PROCEDURE NOTE:  Deep sedation produced by Dr. Duwayne Heck and associates. Instrument, Pentax video chip system.  Digital rectal exam revealed no abnormalities.  Endoscopic findings and prep were inadequate.  There was semi-formed and formed stool in the rectum trailed up in the sigmoid.  I was unable to advance the scope beyond the sigmoid because of the lack of preparation.  The procedure was terminated.  The patient tolerated the procedure well, was taken to PACU in stable condition.  IMPRESSION: 1. Inadequate preparation precluded colonoscopy today. 2. See EGD report. 3. Further recommendations to follow.     Bridgette Habermann, MD FACP [facility]     RMR/MEDQ  D:  12/01/2016  T:  12/01/2016  Job:  [id number]  cc:   Dr. Deirdre Priest

## 2016-12-05 IMAGING — DX DG LUMBAR SPINE COMPLETE 4+V
5 series · 5 of 5 positions shown · non-contrast
Comparison: Thoracic spine series of today's date for purposes of
vertebral body numbering.

CLINICAL DATA: Mid and low back pain without known injury; history
of previous CVA.

EXAM:
LUMBAR SPINE - COMPLETE 4+ VIEW

[l-spine ap]
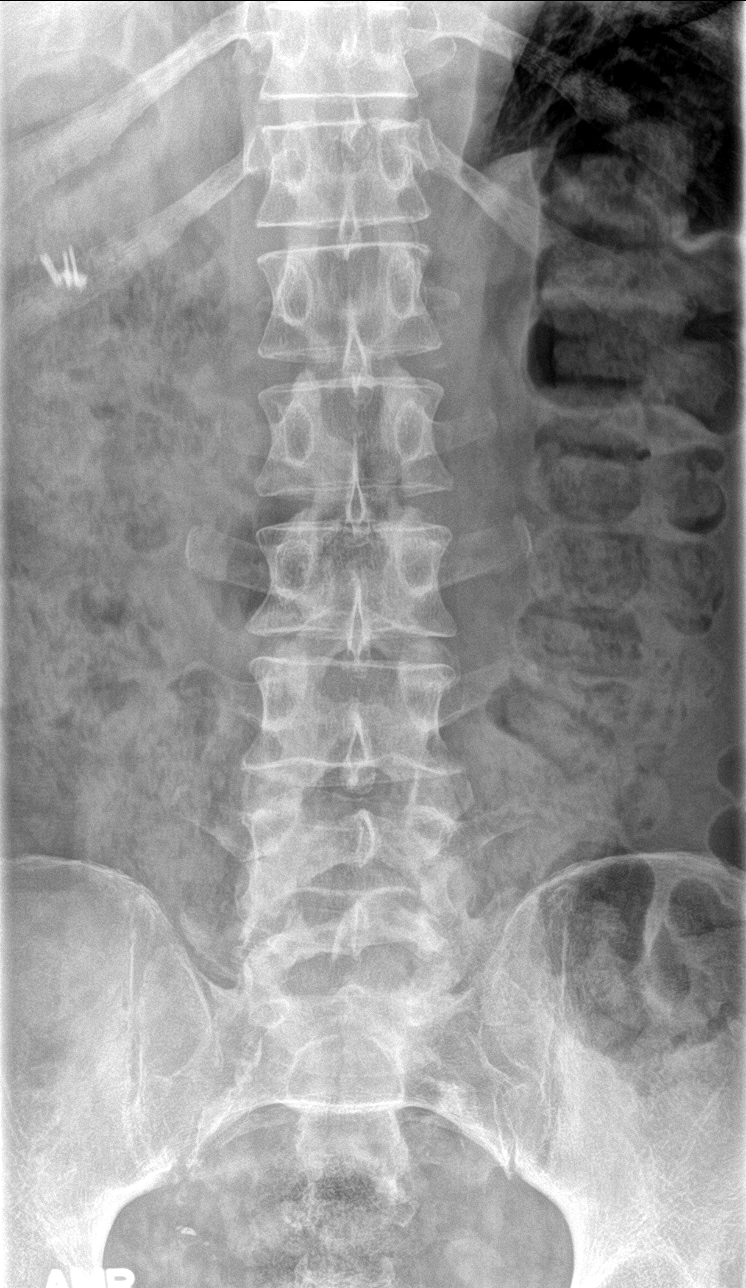

[l-spine obl (1 of 2)]
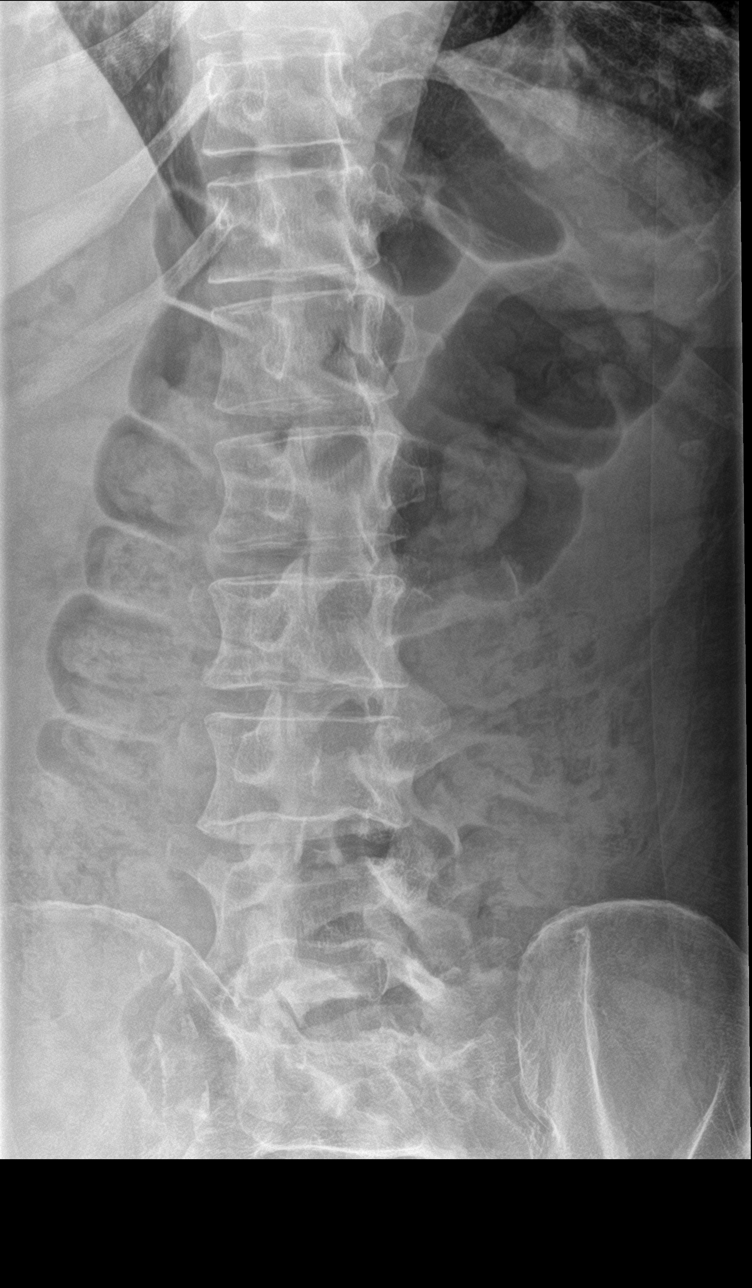

[l-spine obl (2 of 2)]
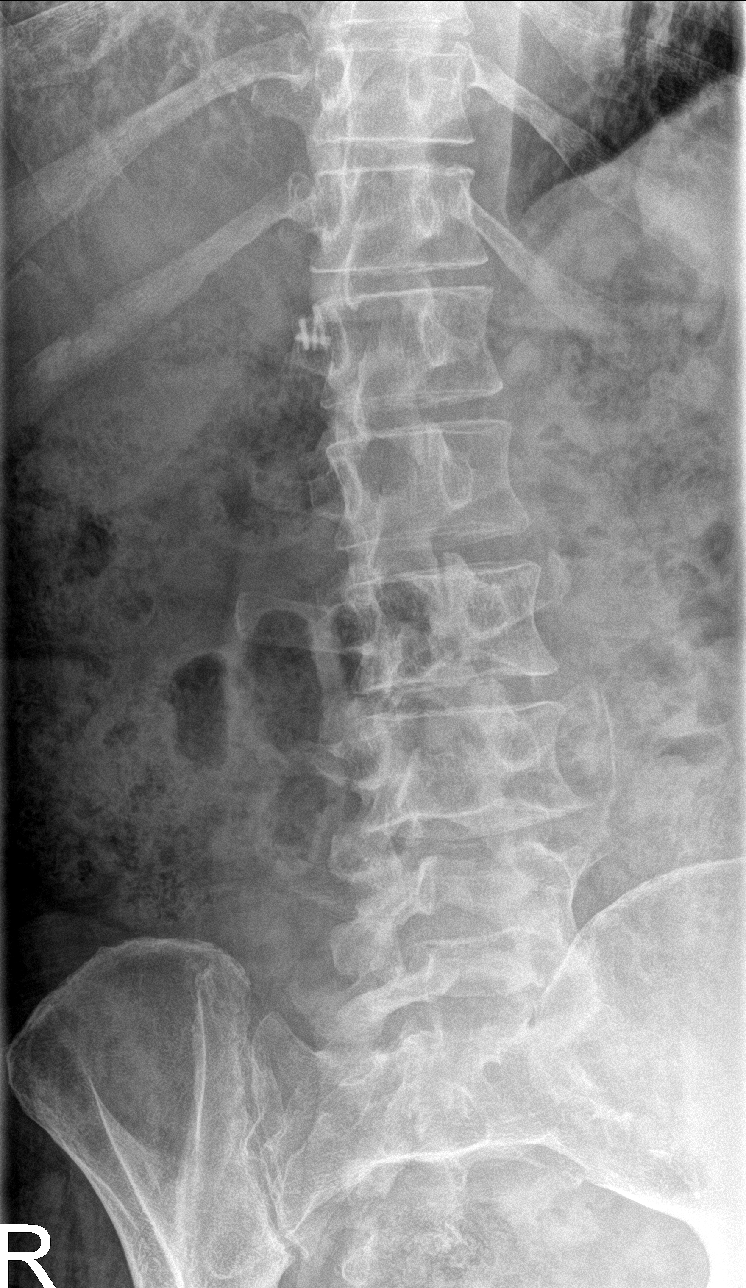

[l-spine lat]
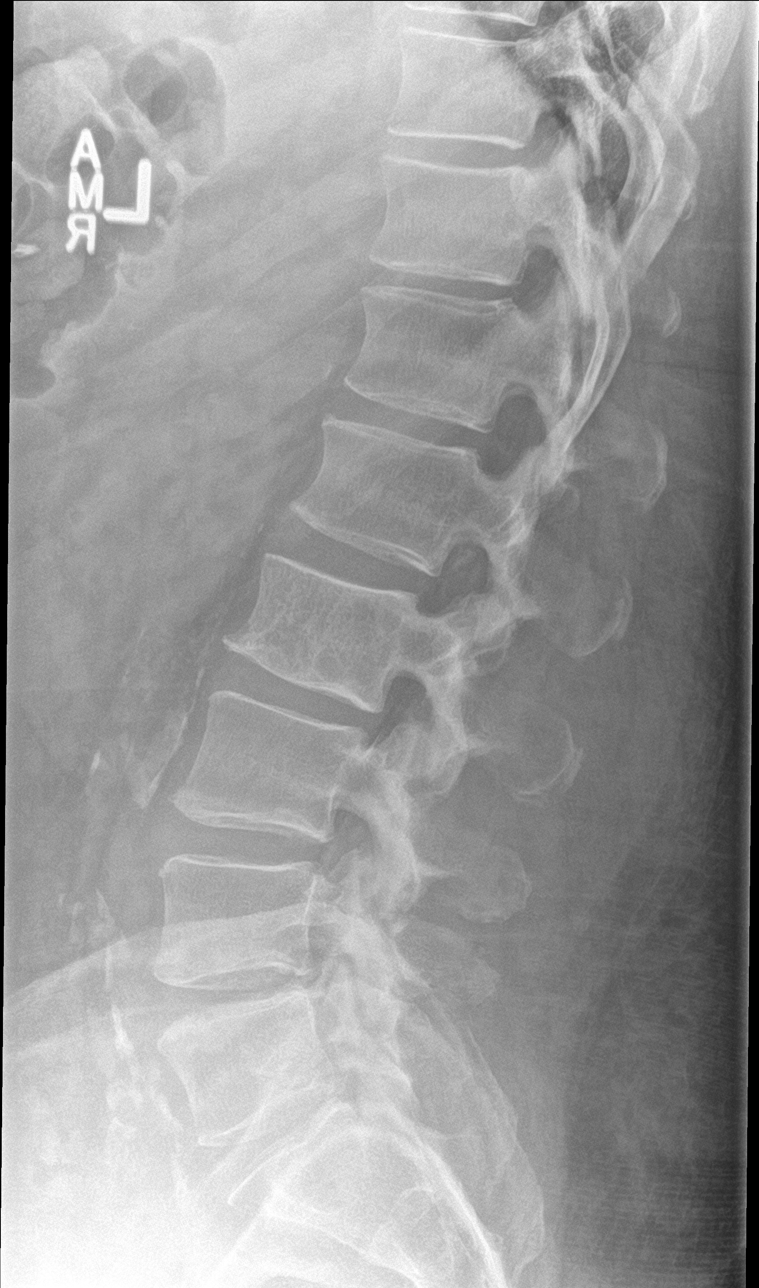

[l-spine spot]
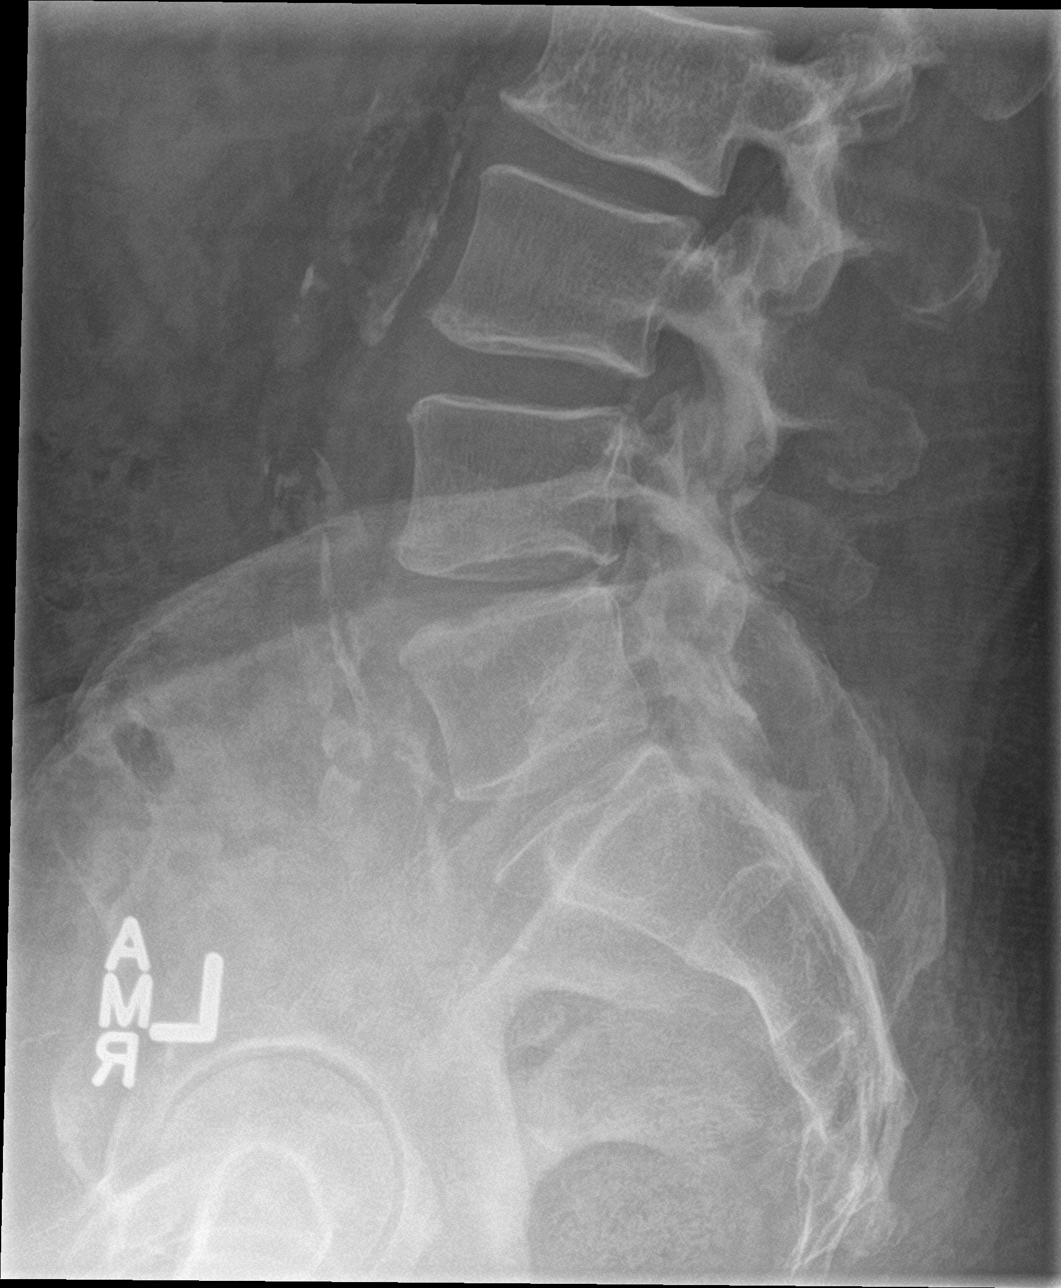

[5 of 5 positions shown; findings below may reference images not displayed]

Coronal and sagittal reconstructed images
through the lumbar spine from a CT scan June 20, 2014.
FINDINGS: S1 is transitional. The lumbar vertebral bodies are preserved in
height. There is mild disc space narrowing at L3-4 and at L5-S1.
There are chronic changes within the body of L3 likely reflecting
the presence of hemangiomas. There is a stable Schmorl node along
the inferior endplate of L3. There is no spondylolisthesis. There is
mild facet joint hypertrophy at L5-S1 and S1-S2.
IMPRESSION: There is no acute bony abnormality of the lumbar spine. Chronic
mixed lytic and sclerotic changes within the L3 vertebral body are
stable since the previous study

If the patient's symptoms persist and remain unexplained, MRI would
be a useful next imaging step.

## 2016-12-05 IMAGING — DX DG THORACIC SPINE 3V
4 series · 4 of 4 positions shown · non-contrast
Comparison: PA and lateral chest x-ray May 05, 2010

CLINICAL DATA: Mid to lower back pain without known injury; history
of previous CVA

EXAM:
THORACIC SPINE - 3 VIEWS

[t-spine lat]
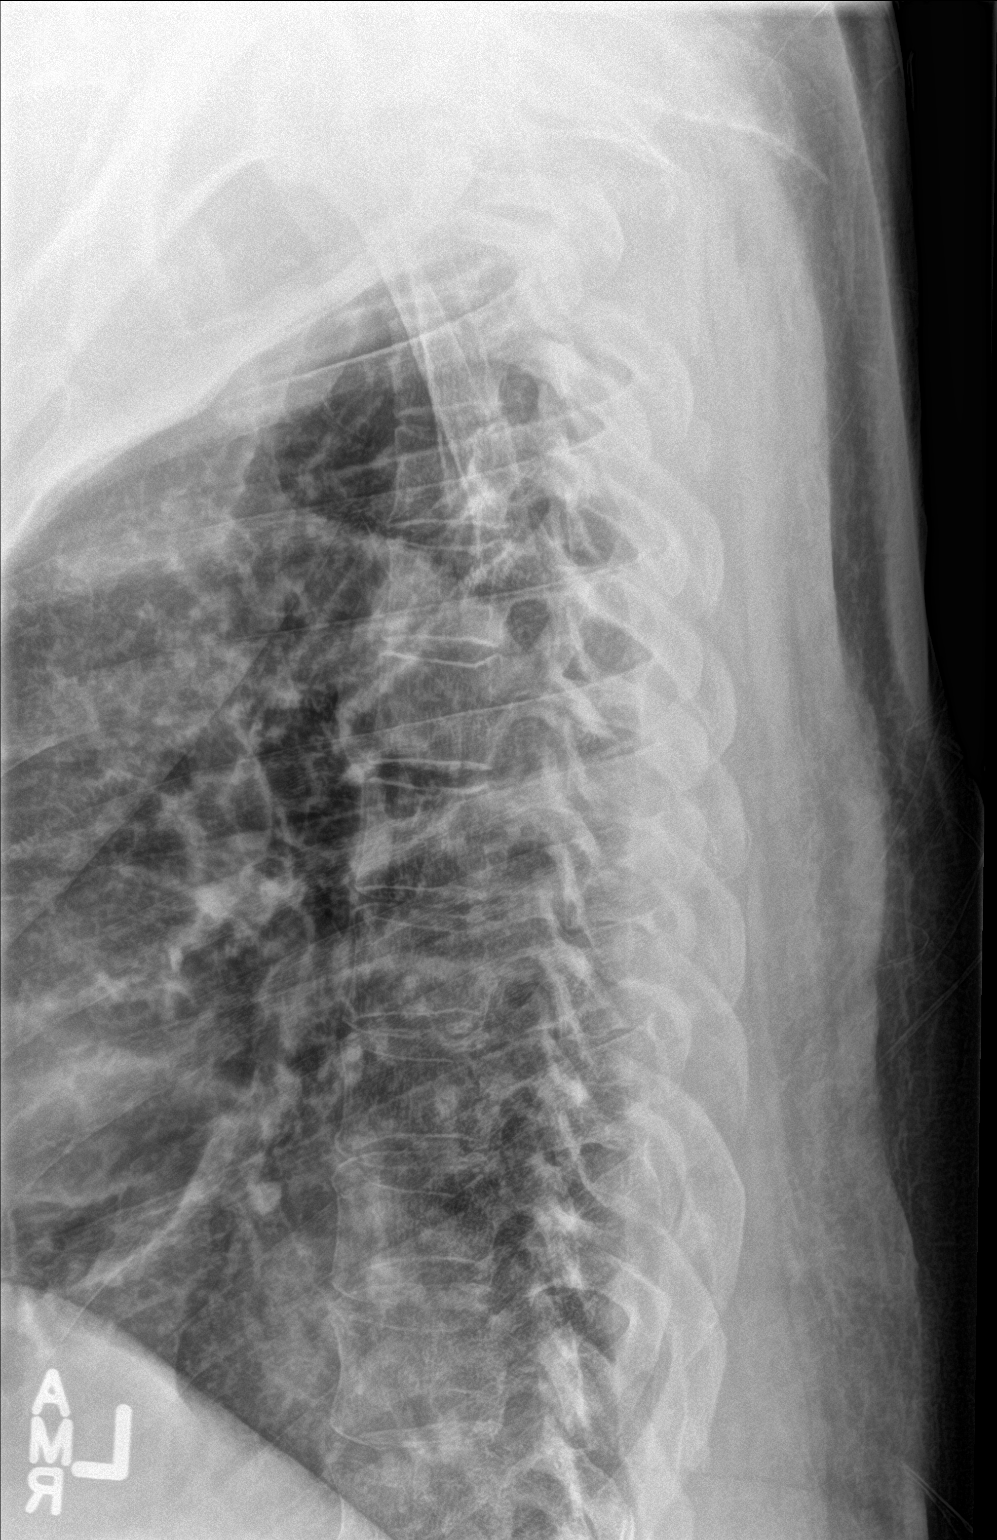

[t-spine swimmers]
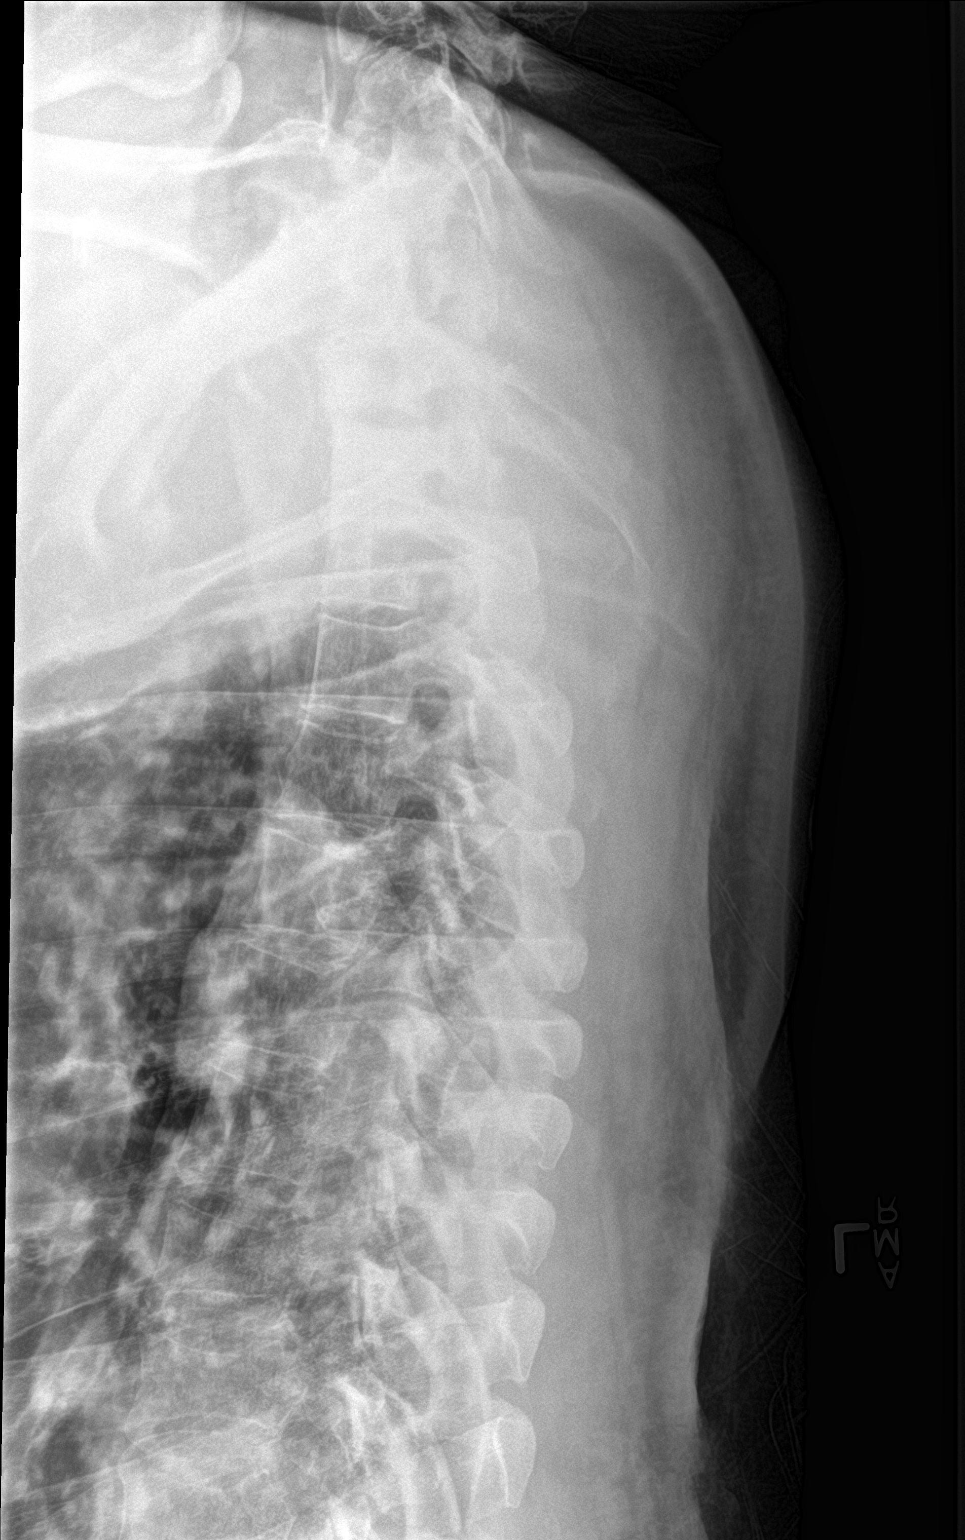

[t-spine ap (1 of 2)]
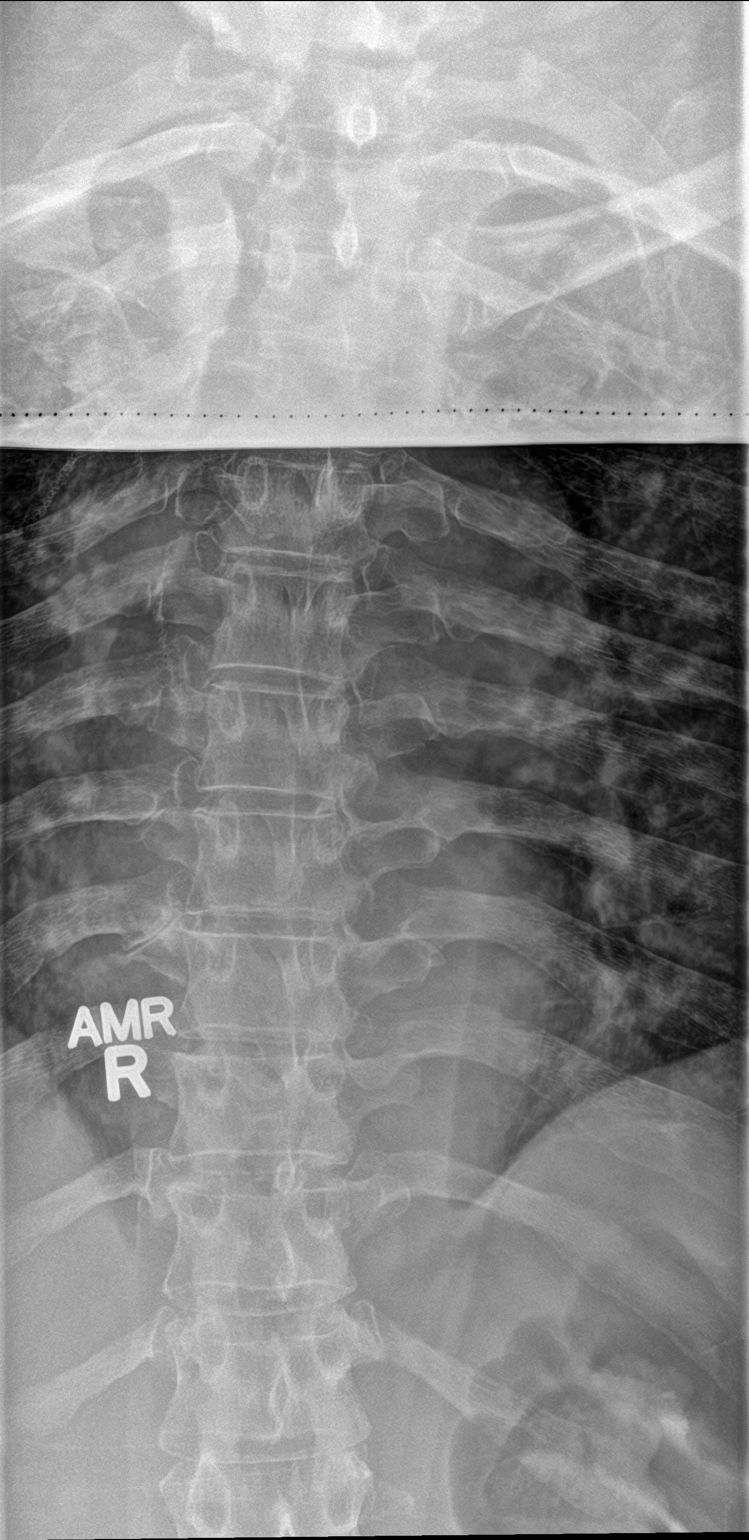

[t-spine ap (2 of 2)]
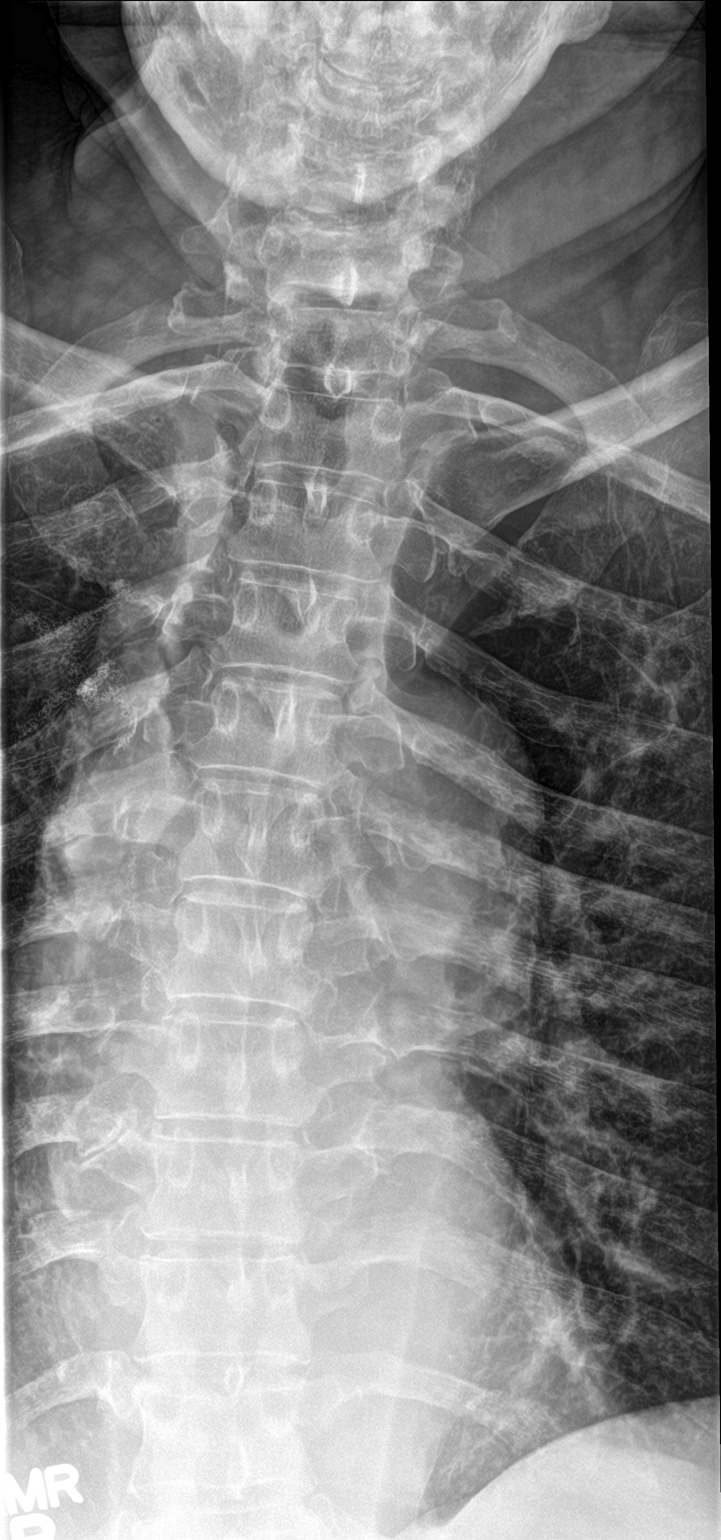

[4 of 4 positions shown; findings below may reference images not displayed]

FINDINGS: The thoracic vertebral bodies are preserved in height. There is no
significant disc space narrowing. The pedicles are intact. There are
no abnormal paravertebral soft tissue densities. There is gentle
dextrocurvature with the convexity toward the right which is not a
new finding. The cervicothoracic junction was difficult to visualize
well.
IMPRESSION: There is no acute or significant chronic bony abnormality of the
thoracic spine.

## 2016-12-06 NOTE — Anesthesia Postprocedure Evaluation (Signed)
Anesthesia Post Note Late entry for 12/01/2016 at 1430  Patient: [name]  Procedure(s) Performed: Procedure(s) (LRB): COLONOSCOPY WITH PROPOFOL (N/A) ESOPHAGOGASTRODUODENOSCOPY (EGD) WITH PROPOFOL (N/A) BIOPSY  Patient location during evaluation: PACU Anesthesia Type: MAC Level of consciousness: awake and alert and oriented Pain management: pain level controlled Vital Signs Assessment: post-procedure vital signs reviewed and stable Respiratory status: spontaneous breathing Cardiovascular status: stable Anesthetic complications: no     Last Vitals:  Vitals:   12/01/16 1445 12/01/16 1516  BP: (!) 120/97 120/86  Pulse: 70 69  Resp: 13 16  Temp:  36.3 C    Last Pain:  Vitals:   12/01/16 1516  TempSrc: Oral                 Laryah Neuser A

## 2016-12-07 ENCOUNTER — Other Ambulatory Visit: Payer: Self-pay | Admitting: Family Medicine

## 2016-12-07 ENCOUNTER — Encounter (HOSPITAL_COMMUNITY): Payer: Self-pay | Admitting: Internal Medicine

## 2016-12-10 ENCOUNTER — Encounter: Payer: Self-pay | Admitting: Internal Medicine

## 2016-12-10 NOTE — Progress Notes (Signed)
Leslie - stomach w food retained 48 hrs after last reported ingestion.  NO eo gastroenteritis on biopsy - I put this specifically on path slip. No mention on report.  I wonder about peri-umbilical hernia - pinching fat as cause of some of pain.   Obsure hx of eos hepatitis rx'd w steroids mentioned in pmhx  - interesting - but repeatedly normal lfts???  Failed to prep for tcs - probably has gastroparesis -needs further evaluation and re-group for tcs. F/u appt In the works

## 2016-12-12 DIAGNOSIS — K30 Functional dyspepsia: Secondary | ICD-10-CM

## 2016-12-12 HISTORY — DX: Functional dyspepsia: K30

## 2016-12-13 ENCOUNTER — Telehealth: Payer: Self-pay

## 2016-12-13 ENCOUNTER — Encounter: Payer: Self-pay | Admitting: Internal Medicine

## 2016-12-13 NOTE — Telephone Encounter (Signed)
Per RMR- Send letter to patient.  Send copy of letter with path to referring provider and PCP.   Needs ov w lsl wn the next month or so.

## 2016-12-13 NOTE — Telephone Encounter (Signed)
APPT MADE AND LETTER SENT  °

## 2016-12-13 NOTE — Telephone Encounter (Signed)
Letter mailed to the pt. Please schedule ov.

## 2016-12-14 ENCOUNTER — Telehealth: Payer: Self-pay | Admitting: Internal Medicine

## 2016-12-14 NOTE — Telephone Encounter (Signed)
Pt had procedure on 12/01/2016 and hasn't heard from the results. Please call [phone]

## 2016-12-14 NOTE — Telephone Encounter (Signed)
Letter was mailed to the pt yesterday. I spoke with the pts wife and read the letter to her and informed her of pts follow up appt. She asked about the possibility of gastroparesis, I informed her that RMR said that was a possibility (per note on path report) and that is why RMR wanted him to come in and he may need further testing. She said she understood. Routing to LSL as [employer]

## 2016-12-15 NOTE — Telephone Encounter (Signed)
Noted, agreed

## 2016-12-16 ENCOUNTER — Encounter (HOSPITAL_COMMUNITY): Payer: Self-pay | Admitting: Emergency Medicine

## 2016-12-16 ENCOUNTER — Emergency Department (HOSPITAL_COMMUNITY)
Admission: EM | Admit: 2016-12-16 | Discharge: 2016-12-16 | Disposition: A | Discharge status: Home / Self Care | Payer: Medicare Other | Attending: Emergency Medicine | Admitting: Emergency Medicine

## 2016-12-16 DIAGNOSIS — N183 Chronic kidney disease, stage 3 (moderate): Secondary | ICD-10-CM | POA: Insufficient documentation

## 2016-12-16 DIAGNOSIS — Z87891 Personal history of nicotine dependence: Secondary | ICD-10-CM | POA: Diagnosis not present

## 2016-12-16 DIAGNOSIS — E1122 Type 2 diabetes mellitus with diabetic chronic kidney disease: Secondary | ICD-10-CM | POA: Insufficient documentation

## 2016-12-16 DIAGNOSIS — I129 Hypertensive chronic kidney disease with stage 1 through stage 4 chronic kidney disease, or unspecified chronic kidney disease: Secondary | ICD-10-CM | POA: Insufficient documentation

## 2016-12-16 DIAGNOSIS — L02212 Cutaneous abscess of back [any part, except buttock]: Secondary | ICD-10-CM | POA: Diagnosis not present

## 2016-12-16 DIAGNOSIS — J45909 Unspecified asthma, uncomplicated: Secondary | ICD-10-CM | POA: Diagnosis not present

## 2016-12-16 DIAGNOSIS — R238 Other skin changes: Secondary | ICD-10-CM | POA: Diagnosis not present

## 2016-12-16 DIAGNOSIS — Z79899 Other long term (current) drug therapy: Secondary | ICD-10-CM | POA: Insufficient documentation

## 2016-12-16 DIAGNOSIS — Z794 Long term (current) use of insulin: Secondary | ICD-10-CM | POA: Diagnosis not present

## 2016-12-16 MED ORDER — CEPHALEXIN 500 MG PO CAPS: 500.0000 mg | ORAL_CAPSULE | Freq: Four times a day (QID) | ORAL | 0 refills | Status: DC

## 2016-12-16 MED ORDER — SULFAMETHOXAZOLE-TRIMETHOPRIM 800-160 MG PO TABS: 1.0000 | ORAL_TABLET | Freq: Two times a day (BID) | ORAL | 0 refills | Status: AC

## 2016-12-16 NOTE — Discharge Instructions (Signed)
Take the entire course of your antibiotics.  Apply warm compresses to the draining abscess on your back as this continues to heal.  You should see a general surgeon about having this site excised (not just lanced) which can prevent it from coming back.  In the interim return here for any worsened sx such as fevers, spreading pain or redness.

## 2016-12-16 NOTE — ED Provider Notes (Signed)
[facility] DEPT Provider Note   CSN: [id number] Arrival date & time: 12/16/16  0907     History   Chief Complaint No chief complaint on file.   HPI [name] is a 71 y.o. male with past medical indicated below, most significant for type 2 DM and recurrent boils presenting With a recurrent abscess on his mid back which wife states has been present for months as a swollen nontender location but ruptured last night injuring copious lidocaine and pus.  This is a location which has been surgically excised in the past by Dr. Romona Curls but continues to return.  Additionally he has developed fluid-filled blisters above and below his left lateral knee this week.  He denies injury, does have a history of bullous impetigo.  However with this prior infection, he does not recall blisters this large.  He denies pain at either site at this time.  Additionally has had no fevers or chills.  His CBG was checked prior to arrival and was 118.[name]  The history is provided by the patient.    Past Medical History:  Diagnosis Date  . Arthritis   . Asthma    "hx of"  . Barrett's esophagus    EGD 03/23/2011 & EGD 2/09 bx proven  . BPH (benign prostatic hyperplasia)   . 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 Chronic abdominal pain   . Colon polyp 03/23/2011   tubular adenoma, Dr. Gala Romney  . 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 ([place]) 07/15/2011  . CVA (cerebral infarction) 1998   right sided deficit  . Diverticulosis    TCS 03/23/11 pancolonic diverticula &TCS 5/08, pancolonic diverticula  . DM (diabetes mellitus) ([place])   . Edema of lower extremity 12/21/12   bilateral   . Esotropia of left eye   . GERD (gastroesophageal reflux disease)   . Glaucoma (increased eye pressure)   . Gout   . Gout   . Hemorrhagic colitis 06/06/2012.  [name] Hemorrhoids, internal 03/23/2011   tcs by Dr. Gala Romney  . Hepatitis    esosiniphilic, tx with prednisone  . Hiatal hernia   . HTN  (hypertension)   . Hx of radiation therapy 1974   right base of skull area-lymphoma  . Hyperlipidemia   . Lower facial weakness    Right  . Lymphoma ([place]) 1974   XRT at [facility], right base of skull area  . Neuropathy ([place])   . Renal insufficiency   . Steatohepatitis    liver biopsy 2009  . Stroke [facility]) 1998   right hemiparesis/plegia    Patient Active Problem List   Diagnosis Date Noted  . RUQ pain 11/09/2016  . Colon adenomas 11/09/2016  . Primary hypothyroidism 09/09/2015  . Chronic constipation 05/22/2015  . Candidiasis, intertrigo 09/05/2014  . Arthritis, shoulder region 12/10/2013  . Impetigo bullosa 10/23/2013  . Intertrigo 10/12/2013  . Chronic left shoulder pain 09/25/2013  . Muscle weakness (generalized) 09/25/2013  . Hypercalcemia 08/08/2013  . CKD (chronic kidney disease), stage III 08/07/2013  . Adhesive capsulitis of left shoulder 05/14/2013  . Steatohepatitis   . Diverticulosis   . GERD (gastroesophageal reflux disease)   . History of CVA (cerebrovascular accident)   . Hiatal hernia   . Hyperlipidemia   . Glaucoma (increased eye pressure)   . Esotropia of left eye   . BPH (benign prostatic hyperplasia)   . Hx of radiation therapy   . 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 Sebaceous  cyst 10/05/2012  . Neck pain 08/26/2012  . Chronic abdominal pain 08/26/2012  . Leg edema 08/24/2012  . Hemiplegia of dominant side as late effect following cerebrovascular disease ([place]) 06/06/2012  . Diarrhea 04/17/2012  . Obesity 04/17/2012  . Diabetic neuropathy ([place]) 02/13/2012  . Recurrent boils 01/12/2012  . 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 Hemorrhoids, internal 03/23/2011  . Hepatitis 03/02/2011  . EOSINOPHILIA 12/23/2009  . Type 2 diabetes mellitus with stage 2 chronic kidney disease ([place]) 01/05/2009  . Gout, unspecified 01/05/2009  . Essential hypertension 01/05/2009  . BARRETTS ESOPHAGUS 01/05/2009  .  DIVERTICULOSIS OF COLON 01/05/2009  . LYMPHOMA, HX OF 01/05/2009    Past Surgical History:  Procedure Laterality Date  . BIOPSY  12/01/2016   Procedure: BIOPSY;  Surgeon: Daneil Dolin, MD;  Location: [facility];  Service: Endoscopy;;  duodenum, gastric, esophagus  . CHOLECYSTECTOMY    . COLONOSCOPY  03/23/11   Dr. Gala Romney  pancolonic diverticula, hemorrhoids, tubular adenoma.. next tcs 03/2016  . COLONOSCOPY WITH PROPOFOL N/A 12/01/2016   Procedure: COLONOSCOPY WITH PROPOFOL;  Surgeon: Daneil Dolin, MD;  Location: [facility];  Service: Endoscopy;  Laterality: N/A;  1230   . ESOPHAGOGASTRODUODENOSCOPY  02/05/08   goblet cell metaplasia/negative for H.pylori  . ESOPHAGOGASTRODUODENOSCOPY  03/23/11   Dr. Gala Romney, barretts, hiatal hernia  . ESOPHAGOGASTRODUODENOSCOPY (EGD) WITH PROPOFOL N/A 12/01/2016   Procedure: ESOPHAGOGASTRODUODENOSCOPY (EGD) WITH PROPOFOL;  Surgeon: Daneil Dolin, MD;  Location: [facility];  Service: Endoscopy;  Laterality: N/A;  . MASS BIOPSY  11/01/2012   Procedure: NECK MASS BIOPSY;  Surgeon: Ascencion Dike, MD;  Location: [facility] ORS;  Service: ENT;  Laterality: Right;  Excisional Bx Right Neck Mass; attempted external jugular cutdown of left side  . PAROTIDECTOMY  11/24/2012   Procedure: PAROTIDECTOMY;  Surgeon: Ascencion Dike, MD;  Location: [facility];  Service: ENT;  Laterality: N/A;  Total parotidectomy  . PLEURECTOMY    . right lymph node removal Right    behind right ear  . Right video-assisted thoracic surgery, pleurectomy, and pleurodesis  2011       Home Medications    Prior to Admission medications   Medication Sig Start Date End Date Taking? Authorizing Provider  allopurinol (ZYLOPRIM) 300 MG tablet TAKE 1 TABLET BY MOUTH EVERY DAY 10/20/16   [name], MD  amLODipine (NORVASC) 10 MG tablet TAKE 1 TABLET BY MOUTH DAILY 09/27/16   [name], MD  cloNIDine (CATAPRES) 0.2 MG tablet TAKE 1 TABLET BY MOUTH THREE TIMES DAILY 07/26/16   [name], MD  clotrimazole-betamethasone (LOTRISONE) cream Apply 1 application topically 2 (two) times daily. Patient taking differently: Apply 1 application topically 2 (two) times daily. Applied to affected areas of skin on legs and arms (do not apply to face and/or groin) 08/16/16   [name], MD  furosemide (LASIX) 40 MG tablet TAKE 1 TABLET BY MOUTH DAILY 10/20/16   [name], MD  gabapentin (NEURONTIN) 300 MG capsule TAKE 2 CAPSULES(600 MG) BY MOUTH THREE TIMES DAILY 11/07/16   [name], MD  Insulin Glargine (LANTUS SOLOSTAR) 100 UNIT/ML Solostar Pen Inject 50 Units into the skin at bedtime. 07/18/16   Cassandria Anger, MD  insulin lispro (HUMALOG KWIKPEN) 100 UNIT/ML KiwkPen Inject 0.05-0.11 mLs (5-11 Units total) into the skin 3 (three) times daily with meals. Patient taking differently: Inject 5-11 Units into the skin 3 (three) times  daily with meals. Sliding scale  90-149=5 units, 150-199=6 units, 200-249=7 units, 250-299=8 units, 300-349=9 units,  350-349=10 units, & greater than 350=11 units 03/11/16   Cassandria Anger, MD  levothyroxine (SYNTHROID, LEVOTHROID) 75 MCG tablet Take 1 tablet (75 mcg total) by mouth daily. 09/26/16   Cassandria Anger, MD  linagliptin (TRADJENTA) 5 MG TABS tablet Take 1 tablet by mouth  daily Patient taking differently: Take 5 mg by mouth daily.  09/09/15   Cassandria Anger, MD  lisinopril (PRINIVIL,ZESTRIL) 20 MG tablet TAKE 1 TABLET BY MOUTH DAILY 09/27/16   [name], MD  metFORMIN (GLUCOPHAGE) 500 MG tablet TAKE 1 TABLET BY MOUTH TWICE DAILY 11/07/16   Cassandria Anger, MD  metoprolol succinate (TOPROL-XL) 50 MG 24 hr tablet TAKE 1 TABLET BY MOUTH DAILY WITH OR IMMEDIATELY FOLLOWING A MEAL 06/08/16   [name], MD  [facility] VERIO test strip TEST FOUR TIMES DAILY 10/31/16   Cassandria Anger, MD  oxyCODONE-acetaminophen (PERCOCET) 7.5-325 MG tablet Take 1 tablet by mouth every 4 (four) hours as  needed. Patient taking differently: Take 1 tablet by mouth 2 (two) times daily.  11/15/16   [name], MD  pantoprazole (PROTONIX) 40 MG tablet TAKE 1 TABLET BY MOUTH DAILY 10/20/16   [name], MD  polyethylene glycol-electrolytes (TRILYTE) 420 g solution Take 4,000 mLs by mouth as directed. 11/10/16   Daneil Dolin, MD  potassium chloride SA (K-DUR,KLOR-CON) 20 MEQ tablet TAKE 1 TABLET(20 MEQ) BY MOUTH DAILY 12/09/16   [name], MD  simvastatin (ZOCOR) 20 MG tablet TAKE 1 TABLET BY MOUTH EVERY NIGHT AT BEDTIME 11/29/16   [name], MD  tamsulosin (FLOMAX) 0.4 MG CAPS capsule TAKE 1 CAPSULE BY MOUTH DAILY 10/24/16   [name], MD  tiZANidine (ZANAFLEX) 2 MG tablet TAKE 1 TABLET BY MOUTH TWICE DAILY AS NEEDED Patient taking differently: TAKE 1 TABLET BY MOUTH TWICE DAILY 11/07/16   [name], MD    Family History Family History  Problem Relation Age of Onset  . Heart failure Mother   . Heart failure Father   . Heart failure Sister   . Heart failure Son     Social History Social History  Substance Use Topics  . Smoking status: Former Smoker    Packs/day: 3.00    Years: 25.00    Types: Cigarettes    Quit date: 03/01/1997  . Smokeless tobacco: Never Used  . Alcohol use No     Allergies   Patient has no known allergies.   Review of Systems Review of Systems  Constitutional: Negative for chills and fever.  Respiratory: Negative for shortness of breath and wheezing.   Skin: Positive for color change, rash and wound.  Neurological: Negative for numbness.     Physical Exam Updated Vital Signs BP 135/89 (BP Location: Left Arm)   Pulse 99   Temp 97.9 F (36.6 C) (Oral)   Resp 18   Ht 6' 1"  (1.854 m)   Wt 124.7 kg   SpO2 96%   BMI 36.28 kg/m   Physical Exam  Constitutional: He appears well-developed and well-nourished. No distress.  HENT:  Head: Normocephalic.  Neck: Neck supple.  Cardiovascular: Normal rate.    Pulmonary/Chest: Effort normal. He has no wheezes.  Musculoskeletal: Normal range of motion. He exhibits no edema.  Skin:  Open abscess site mid upper back with no persistent drainage or induration.  No surrounding erythema.  Nontender.  5  cm x 2 cm bulla which appears clear fluid filled with an isolated area of erythema on the anterior edge of this bulla.  No surrounding erythema.  Small 1 cm blister which is skin colored, no erythema left lateral lower thigh.     ED Treatments / Results  Labs (all labs ordered are listed, but only abnormal results are displayed) Labs Reviewed - No data to display  EKG  EKG Interpretation None       Radiology No results found.  Procedures Procedures (including critical care time)  Medications Ordered in ED Medications - No data to display   Initial Impression / Assessment and Plan / ED Course  I have reviewed the triage vital signs and the nursing notes.  Pertinent labs & imaging results that were available during my care of the patient were reviewed by me and considered in my medical decision making (see chart for details).  Clinical Course     Draining abscess mid back with no need at this time for further surgical intervention.  He will be placed on abx, advised warm soaks.  Referral to general surgery for definitive excision since this abscess continues to return.  Also recommended f/u with his dermatologist given the bullous lesion on his leg.  With h/o bullous impetigo will cover for this.  Prn f/u anticipated.  Pt was also seen by Dr. Dayna Barker during todays visit.  Final Clinical Impressions(s) / ED Diagnoses   Final diagnoses:  None    New Prescriptions New Prescriptions   No medications on file     Evalee Jefferson, PA-C 12/16/16 [place], MD 12/16/16 [phone]

## 2016-12-16 NOTE — ED Provider Notes (Signed)
Medical screening examination/treatment/procedure(s) were conducted as a shared visit with non-physician practitioner(s) and myself.  I personally evaluated the patient during the encounter.  Draining abscess on back with large cavity. Apparently recurrent per wife report. Has some cellulitis in area as well, will start antibiotics. Likely needs an excision, will refer to surgery.  Has a couple bullae to left lower lateral leg that do not have any evidence of infection. Likely needs a biopsy to rule out bullous pemphigus or vulgaris. Would recommend referral to dermatology, hopefully in the next week to get evaluation while bullae present.    Merrily Pew, MD 12/16/16 [phone]

## 2016-12-16 NOTE — ED Triage Notes (Signed)
Patient complains of abscess to middle of back that has ruptured; also abscess and blister above left knee laterally and below left knee laterally. Denies fever/chills/n/v.

## 2016-12-17 ENCOUNTER — Other Ambulatory Visit: Payer: Self-pay | Admitting: Family Medicine

## 2016-12-20 ENCOUNTER — Ambulatory Visit (INDEPENDENT_AMBULATORY_CARE_PROVIDER_SITE_OTHER): Payer: Medicare Other | Admitting: Family Medicine

## 2016-12-20 ENCOUNTER — Encounter: Payer: Self-pay | Admitting: Family Medicine

## 2016-12-20 VITALS — BP 112/72 | HR 84 | Temp 98.2°F

## 2016-12-20 DIAGNOSIS — L0293 Carbuncle, unspecified: Secondary | ICD-10-CM

## 2016-12-20 DIAGNOSIS — K5909 Other constipation: Secondary | ICD-10-CM

## 2016-12-20 DIAGNOSIS — R109 Unspecified abdominal pain: Secondary | ICD-10-CM | POA: Diagnosis not present

## 2016-12-20 DIAGNOSIS — G8929 Other chronic pain: Secondary | ICD-10-CM

## 2016-12-20 MED ORDER — OXYCODONE-ACETAMINOPHEN 7.5-325 MG PO TABS: 1.0000 | ORAL_TABLET | ORAL | 0 refills | Status: DC | PRN

## 2016-12-20 MED ORDER — NALOXEGOL OXALATE 25 MG PO TABS: 25.0000 mg | ORAL_TABLET | Freq: Every day | ORAL | 0 refills | Status: DC

## 2016-12-20 NOTE — Progress Notes (Signed)
Subjective:    Patient ID: [name], male    DOB: [birth year], 71 y.o.   MRN: [id number]  Patient presents for 4 month F/U (is not fasting) and Frequent Abscess (abscesses noted to intermittnelty appear- recently one burst- went to ER and was started on ABTx- has been seen by dermatology)     History of recurrent boils- on Bactrim and Keflex, To include by the emergency room 2 days ago. The lesion on his right upper back and include started to drain this is actually recurred in a previous surgical I and D site. He has numerous surgical ID spots and still gets boils in the same regions or new places. He also had a blistering lesion, one his left lower leg which he's also had in the past this started to drain pus along with clear fluid. Of note he was seen by dermatology a few months ago secondary to these recurrent skin infections they gave him clindamycin solution and he was already using antibacterial soaps. This did not improve     Since her last visit he has been seen by gastroenterology as he is chronic right upper quadrant pain that began having epigastric pain. He had EGD performed that did not do the colonoscopy even after the cleanout he still has significant gastric and bowel contents. There is concern that he has significant gastroparesis. He is also very constipated wife has been given MiraLAX which has not been helping he is scheduled to go back to gastroenterology in a few weeks for further testing for the gastroparesis  He has appointment to see endocrinology next week and will have fasting labs at that time   Review Of Systems:  GEN- denies fatigue, fever, weight loss,weakness, recent illness HEENT- denies eye drainage, change in vision, nasal discharge, CVS- denies chest pain, palpitations RESP- denies SOB, cough, wheeze ABD- denies N/V, change in stools,+ abd pain GU- denies dysuria, hematuria, dribbling, incontinence MSK- denies joint pain, muscle aches, injury Neuro-  denies headache, dizziness, syncope, seizure activity       Objective:    BP 112/72 (BP Location: Left Arm, Patient Position: Sitting, Cuff Size: Large)   Pulse 84   Temp 98.2 F (36.8 C) (Oral)   SpO2 97%  GEN- NAD, alert and oriented x3,sitting in wheel chair  HEENT- Right pupil reactive EOMI, non injected sclera, pink conjunctiva, MMM, oropharynx clear CVS- RRR, no murmur RESP-CTAB ABD-NABS,soft,TTP epigastric region,  Ext- Bilat chronic edema unchanged,venous stasis skin changes LE  sKIN- Left leg flat blister with mild pus draining, mild erythema,  Right upper Back- draining abscess- open, TTP        Assessment & Plan:      Problem List Items Addressed This Visit    Recurrent boils    Recurrent boils and abscesses on the skin sometimes presenting as blistering lesions. We've done antibacterial washes to the skin he has been on clindamycin solution he has had surgical draining and removal of boils and cyst on his back but they continue to recur. The family is very frustrated and rightfully so. We discussed sending him to infectious disease to see if there is any other regimen . Of note his diabetes has been very well controlled.  I did culture both spots today      Relevant Orders   WOUND CULTURE   WOUND CULTURE   Chronic constipation - Primary    Trial of Movantik  given samples from the office follow-up with GI regarding the gastroparesis testing  Chronic abdominal pain   Relevant Medications   oxyCODONE-acetaminophen (PERCOCET) 7.5-325 MG tablet      Note: This dictation was prepared with Dragon dictation along with smaller phrase technology. Any transcriptional errors that result from this process are unintentional.

## 2016-12-20 NOTE — Assessment & Plan Note (Signed)
Trial of Movantik  given samples from the office follow-up with GI regarding the gastroparesis testing

## 2016-12-20 NOTE — Patient Instructions (Signed)
Referral to Infectious disease Trial of Movantik We will call with culture complete antibiotics F/U 6 months Physical

## 2016-12-20 NOTE — Assessment & Plan Note (Addendum)
Recurrent boils and abscesses on the skin sometimes presenting as blistering lesions. We've done antibacterial washes to the skin he has been on clindamycin solution he has had surgical draining and removal of boils and cyst on his back but they continue to recur. The family is very frustrated and rightfully so. We discussed sending him to infectious disease to see if there is any other regimen . Of note his diabetes has been very well controlled.  I did culture both spots today

## 2016-12-23 ENCOUNTER — Other Ambulatory Visit: Payer: Self-pay | Admitting: Family Medicine

## 2016-12-23 LAB — WOUND CULTURE
GRAM STAIN: NONE SEEN
GRAM STAIN: NONE SEEN
GRAM STAIN: NONE SEEN
Gram Stain: NONE SEEN
Gram Stain: NONE SEEN
Organism ID, Bacteria: NO GROWTH
Organism ID, Bacteria: NO GROWTH

## 2016-12-27 ENCOUNTER — Other Ambulatory Visit: Payer: Self-pay | Admitting: "Endocrinology

## 2016-12-27 DIAGNOSIS — Z794 Long term (current) use of insulin: Secondary | ICD-10-CM | POA: Diagnosis not present

## 2016-12-27 DIAGNOSIS — E1122 Type 2 diabetes mellitus with diabetic chronic kidney disease: Secondary | ICD-10-CM | POA: Diagnosis not present

## 2016-12-27 DIAGNOSIS — N182 Chronic kidney disease, stage 2 (mild): Secondary | ICD-10-CM | POA: Diagnosis not present

## 2016-12-27 LAB — COMPREHENSIVE METABOLIC PANEL
ALT: 11 U/L (ref 9–46)
AST: 12 U/L (ref 10–35)
Albumin: 4 g/dL (ref 3.6–5.1)
Alkaline Phosphatase: 74 U/L (ref 40–115)
BUN: 20 mg/dL (ref 7–25)
CALCIUM: 11.1 mg/dL — AB (ref 8.6–10.3)
CHLORIDE: 105 mmol/L (ref 98–110)
CO2: 26 mmol/L (ref 20–31)
Creat: 1.66 mg/dL — ABNORMAL HIGH (ref 0.70–1.18)
Glucose, Bld: 116 mg/dL — ABNORMAL HIGH (ref 65–99)
POTASSIUM: 6.1 mmol/L — AB (ref 3.5–5.3)
SODIUM: 138 mmol/L (ref 135–146)
Total Bilirubin: 0.3 mg/dL (ref 0.2–1.2)
Total Protein: 7.4 g/dL (ref 6.1–8.1)

## 2016-12-28 LAB — HEMOGLOBIN A1C
HEMOGLOBIN A1C: 6.9 % — AB (ref <5.7)
MEAN PLASMA GLUCOSE: 151 mg/dL

## 2016-12-28 NOTE — Progress Notes (Signed)
noted 

## 2017-01-02 ENCOUNTER — Encounter: Payer: Self-pay | Admitting: "Endocrinology

## 2017-01-02 ENCOUNTER — Ambulatory Visit (INDEPENDENT_AMBULATORY_CARE_PROVIDER_SITE_OTHER): Payer: Medicare Other | Admitting: "Endocrinology

## 2017-01-02 VITALS — BP 109/75 | HR 74 | Ht 72.0 in

## 2017-01-02 DIAGNOSIS — Z794 Long term (current) use of insulin: Secondary | ICD-10-CM | POA: Diagnosis not present

## 2017-01-02 DIAGNOSIS — E782 Mixed hyperlipidemia: Secondary | ICD-10-CM

## 2017-01-02 DIAGNOSIS — E039 Hypothyroidism, unspecified: Secondary | ICD-10-CM | POA: Diagnosis not present

## 2017-01-02 DIAGNOSIS — I10 Essential (primary) hypertension: Secondary | ICD-10-CM | POA: Diagnosis not present

## 2017-01-02 DIAGNOSIS — E1122 Type 2 diabetes mellitus with diabetic chronic kidney disease: Secondary | ICD-10-CM

## 2017-01-02 DIAGNOSIS — N182 Chronic kidney disease, stage 2 (mild): Secondary | ICD-10-CM

## 2017-01-02 NOTE — Progress Notes (Signed)
Subjective:    Patient ID: [name], male    DOB: 04-29-1946,    Past Medical History:  Diagnosis Date  . Arthritis   . Asthma    "hx of"  . Barrett's esophagus    EGD 03/23/2011 & EGD 2/09 bx proven  . BPH (benign prostatic hyperplasia)   . Cancer of parotid gland ([place]) 11/23/12   Adenocarcinoma  . Chronic abdominal pain   . Colon polyp 03/23/2011   tubular adenoma, Dr. Gala Romney  . 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 ([place]) 07/15/2011  . CVA (cerebral infarction) 1998   right sided deficit  . Diverticulosis    TCS 03/23/11 pancolonic diverticula &TCS 5/08, pancolonic diverticula  . DM (diabetes mellitus) ([place])   . Edema of lower extremity 12/21/12   bilateral   . Esotropia of left eye   . GERD (gastroesophageal reflux disease)   . Glaucoma (increased eye pressure)   . Gout   . Gout   . Hemorrhagic colitis 06/06/2012.  [name] Hemorrhoids, internal 03/23/2011   tcs by Dr. Gala Romney  . Hepatitis    esosiniphilic, tx with prednisone  . Hiatal hernia   . HTN (hypertension)   . Hx of radiation therapy 1974   right base of skull area-lymphoma  . Hyperlipidemia   . Lower facial weakness    Right  . Lymphoma ([place]) 1974   XRT at [facility], right base of skull area  . Neuropathy ([place])   . Renal insufficiency   . Steatohepatitis    liver biopsy 2009  . Stroke [facility]) 1998   right hemiparesis/plegia   Past Surgical History:  Procedure Laterality Date  . BIOPSY  12/01/2016   Procedure: BIOPSY;  Surgeon: Daneil Dolin, MD;  Location: [facility];  Service: Endoscopy;;  duodenum, gastric, esophagus  . CHOLECYSTECTOMY    . COLONOSCOPY  03/23/11   Dr. Gala Romney  pancolonic diverticula, hemorrhoids, tubular adenoma.. next tcs 03/2016  . COLONOSCOPY WITH PROPOFOL N/A 12/01/2016   Procedure: COLONOSCOPY WITH PROPOFOL;  Surgeon: Daneil Dolin, MD;  Location: [facility];  Service: Endoscopy;  Laterality: N/A;  1230   . ESOPHAGOGASTRODUODENOSCOPY  02/05/08   goblet cell  metaplasia/negative for H.pylori  . ESOPHAGOGASTRODUODENOSCOPY  03/23/11   Dr. Gala Romney, barretts, hiatal hernia  . ESOPHAGOGASTRODUODENOSCOPY (EGD) WITH PROPOFOL N/A 12/01/2016   Procedure: ESOPHAGOGASTRODUODENOSCOPY (EGD) WITH PROPOFOL;  Surgeon: Daneil Dolin, MD;  Location[facility];  Service: Endoscopy;  Laterality: N/A;  . MASS BIOPSY  11/01/2012   Procedure: NECK MASS BIOPSY;  Surgeon: Ascencion Dike, MD;  Location: [facility] ORS;  Service: ENT;  Laterality: Right;  Excisional Bx Right Neck Mass; attempted external jugular cutdown of left side  . PAROTIDECTOMY  11/24/2012   Procedure: PAROTIDECTOMY;  Surgeon: Ascencion Dike, MD;  Location: [place];  Service: ENT;  Laterality: N/A;  Total parotidectomy  . PLEURECTOMY    . right lymph node removal Right    behind right ear  . Right video-assisted thoracic surgery, pleurectomy, and pleurodesis  2011   Social History   Social History  . Marital status: Married    Spouse name: N/A  . Number of children: 1  . Years of education: N/A   Occupational History  . disabled    Social History Main Topics  . Smoking status: Former Smoker    Packs/day: 3.00    Years: 25.00    Types: Cigarettes    Quit date: 03/01/1997  . Smokeless  tobacco: Never Used  . Alcohol use No  . Drug use: No  . Sexual activity: No   Other Topics Concern  . None   Social History Narrative  . None   Outpatient Encounter Prescriptions as of 01/02/2017  Medication Sig  . allopurinol (ZYLOPRIM) 300 MG tablet TAKE 1 TABLET BY MOUTH EVERY DAY  . amLODipine (NORVASC) 10 MG tablet TAKE 1 TABLET BY MOUTH DAILY  . cloNIDine (CATAPRES) 0.2 MG tablet TAKE 1 TABLET BY MOUTH THREE TIMES DAILY  . clotrimazole-betamethasone (LOTRISONE) cream Apply 1 application topically 2 (two) times daily. (Patient taking differently: Apply 1 application topically 2 (two) times daily. Applied to affected areas of skin on legs and arms (do not apply to face and/or groin))  . furosemide (LASIX) 40 MG  tablet TAKE 1 TABLET BY MOUTH DAILY  . gabapentin (NEURONTIN) 300 MG capsule TAKE 2 CAPSULES(600 MG) BY MOUTH THREE TIMES DAILY  . Insulin Glargine (LANTUS SOLOSTAR) 100 UNIT/ML Solostar Pen Inject 50 Units into the skin at bedtime.  . insulin lispro (HUMALOG KWIKPEN) 100 UNIT/ML KiwkPen Inject 0.05-0.11 mLs (5-11 Units total) into the skin 3 (three) times daily with meals. (Patient taking differently: Inject 5-11 Units into the skin 3 (three) times daily with meals. Sliding scale  90-149=5 units, 150-199=6 units, 200-249=7 units, 250-299=8 units, 300-349=9 units,  350-349=10 units, & greater than 350=11 units)  . levothyroxine (SYNTHROID, LEVOTHROID) 75 MCG tablet Take 1 tablet (75 mcg total) by mouth daily.  [name] linagliptin (TRADJENTA) 5 MG TABS tablet Take 1 tablet by mouth  daily (Patient taking differently: Take 5 mg by mouth daily. )  . lisinopril (PRINIVIL,ZESTRIL) 20 MG tablet TAKE 1 TABLET BY MOUTH DAILY  . metFORMIN (GLUCOPHAGE) 500 MG tablet TAKE 1 TABLET BY MOUTH TWICE DAILY  . metoprolol succinate (TOPROL-XL) 50 MG 24 hr tablet TAKE 1 TABLET BY MOUTH DAILY WITH OR IMMEDIATELY FOLLOWING A MEAL  . naloxegol oxalate (MOVANTIK) 25 MG TABS tablet Take 1 tablet (25 mg total) by mouth daily.  [name] VERIO test strip TEST FOUR TIMES DAILY  . oxyCODONE-acetaminophen (PERCOCET) 7.5-325 MG tablet Take 1 tablet by mouth every 4 (four) hours as needed.  . pantoprazole (PROTONIX) 40 MG tablet TAKE 1 TABLET BY MOUTH DAILY  . polyethylene glycol-electrolytes (TRILYTE) 420 g solution Take 4,000 mLs by mouth as directed.  . potassium chloride SA (K-DUR,KLOR-CON) 20 MEQ tablet TAKE 1 TABLET(20 MEQ) BY MOUTH DAILY  . simvastatin (ZOCOR) 20 MG tablet TAKE 1 TABLET BY MOUTH EVERY NIGHT AT BEDTIME  . tamsulosin (FLOMAX) 0.4 MG CAPS capsule TAKE 1 CAPSULE BY MOUTH DAILY  . tiZANidine (ZANAFLEX) 2 MG tablet TAKE 1 TABLET BY MOUTH TWICE DAILY AS NEEDED (Patient taking differently: TAKE 1 TABLET BY MOUTH TWICE  DAILY)  . [DISCONTINUED] cephALEXin (KEFLEX) 500 MG capsule Take 1 capsule (500 mg total) by mouth 4 (four) times daily.   No facility-administered encounter medications on file as of 01/02/2017.    ALLERGIES: No Known Allergies VACCINATION STATUS: Immunization History  Administered Date(s) Administered  . Influenza Split 08/24/2012  . Influenza,inj,Quad PF,36+ Mos 10/09/2013, 09/05/2014, 11/23/2015, 08/16/2016  . Pneumococcal Conjugate-13 01/07/2014  . Pneumococcal Polysaccharide-23 11/25/2012    Diabetes  He presents for his follow-up diabetic visit. He has type 2 diabetes mellitus. Onset time: diagnosed approx at age 87. His disease course has been stable. There are no hypoglycemic associated symptoms. Pertinent negatives for hypoglycemia include no confusion, headaches, pallor or seizures. Associated symptoms include weakness. Pertinent negatives for diabetes  include no chest pain, no fatigue, no polydipsia, no polyphagia and no polyuria. Symptoms are stable. Diabetic complications include a CVA. He never participates in exercise. His breakfast blood glucose range is generally 140-180 mg/dl. His overall blood glucose range is 140-180 mg/dl. An ACE inhibitor/angiotensin II receptor blocker is being taken.  Hyperlipidemia  Pertinent negatives include no chest pain, myalgias or shortness of breath.  Hypertension  Pertinent negatives include no chest pain, headaches, neck pain, palpitations or shortness of breath. Hypertensive end-organ damage includes CVA.     Review of Systems  Constitutional: Negative for chills, fatigue, fever and unexpected weight change.  HENT: Negative for dental problem, mouth sores and trouble swallowing.   Eyes: Negative for visual disturbance.  Respiratory: Negative for cough, choking, chest tightness, shortness of breath and wheezing.   Cardiovascular: Negative for chest pain, palpitations and leg swelling.  Gastrointestinal: Negative for abdominal  distention, abdominal pain, constipation, diarrhea, nausea and vomiting.  Endocrine: Negative for polydipsia, polyphagia and polyuria.  Genitourinary: Negative for dysuria, flank pain, hematuria and urgency.  Musculoskeletal: Negative for back pain, gait problem, joint swelling, myalgias and neck pain.  Skin: Negative for pallor, rash and wound.  Neurological: Positive for weakness. Negative for seizures, numbness and headaches.  Psychiatric/Behavioral: Negative for confusion, dysphoric mood, hallucinations and suicidal ideas.    Objective:    BP 109/75   Pulse 74   Ht 6' (1.829 m)   Wt Readings from Last 3 Encounters:  12/16/16 275 lb (124.7 kg)  12/01/16 275 lb (124.7 kg)  11/25/16 260 lb (117.9 kg)    Physical Exam  Constitutional: He is oriented to person, place, and time. He appears well-developed and well-nourished. He is cooperative.  HENT:  Head: Normocephalic and atraumatic.  Eyes: EOM are normal.  Neck: Normal range of motion. Neck supple. No tracheal deviation present. No thyromegaly present.  Cardiovascular: Normal rate and normal heart sounds.  Exam reveals no gallop.   No murmur heard. Pulses:      Dorsalis pedis pulses are 1+ on the right side.       Posterior tibial pulses are 1+ on the right side.  Pulmonary/Chest: Breath sounds normal. No respiratory distress. He has no wheezes.  Abdominal: Soft. Bowel sounds are normal. He exhibits no distension.  Musculoskeletal: He exhibits no edema.  Neurological: He is alert and oriented to person, place, and time. A sensory deficit is present. No cranial nerve deficit. He exhibits normal muscle tone. Gait normal.  Pt is wheelchair bound due to past recurrent CVA  Skin: Skin is warm and dry. No rash noted. No cyanosis. Nails show no clubbing.  Psychiatric: He has a normal mood and affect. His speech is normal and behavior is normal. Judgment and thought content normal. Cognition and memory are normal.    CMP  CMP Latest  Ref Rng & Units 12/27/2016 11/25/2016 11/09/2016  Glucose 65 - 99 mg/dL 116(H) 131(H) -  BUN 7 - 25 mg/dL 20 22(H) -  Creatinine 0.70 - 1.18 mg/dL 1.66(H) 1.60(H) -  Sodium 135 - 146 mmol/L 138 135 -  Potassium 3.5 - 5.3 mmol/L 6.1(H) 4.9 -  Chloride 98 - 110 mmol/L 105 105 -  CO2 20 - 31 mmol/L 26 19(L) -  Calcium 8.6 - 10.3 mg/dL 11.1(H) 10.6(H) -  Total Protein 6.1 - 8.1 g/dL 7.4 - 6.8  Total Bilirubin 0.2 - 1.2 mg/dL 0.3 - 0.3  Alkaline Phos 40 - 115 U/L 74 - 71  AST 10 - 35 U/L  12 - 13  ALT 9 - 46 U/L 11 - 11    Diabetic Labs (most recent): Lab Results  Component Value Date   HGBA1C 6.9 (H) 12/27/2016   HGBA1C 6.8 (H) 08/09/2016   HGBA1C 7.2 (H) 06/17/2016   Lipid Panel     Component Value Date/Time   CHOL 146 12/03/2015 0821   TRIG 180 (H) 12/03/2015 0821   HDL 50 12/03/2015 0821   CHOLHDL 2.9 12/03/2015 0821   VLDL 36 (H) 12/03/2015 0821   LDLCALC 60 12/03/2015 0821     Assessment & Plan:   1. Type 2 diabetes mellitus with stage 2 chronic kidney disease and CVA - patient remains at a high risk for more acute and chronic complications of diabetes which include CAD, CVA, CKD, retinopathy, and neuropathy. These are all discussed in detail with the patient.  Patient came with controlled glucose profile, and  recent A1c of 6.9 %.  Glucose logs and insulin administration records pertaining to this visit,  to be scanned into patient's records.  Recent labs reviewed.   - I have re-counseled the patient on diet management and weight loss  by adopting a carbohydrate restricted / protein rich  Diet.  - Suggestion is made for patient to avoid simple carbohydrates   from their diet including Cakes , Desserts, Ice Cream,  Soda (  diet and regular) , Sweet Tea , Candies,  Chips, Cookies, Artificial Sweeteners,   and "Sugar-free" Products .  This will help patient to have stable blood glucose profile and potentially avoid unintended  Weight gain.  - Patient is advised to stick to  a routine mealtimes to eat 3 meals  a day and avoid unnecessary snacks ( to snack only to correct hypoglycemia).    - I have approached patient with the following individualized plan to manage diabetes and patient agrees.  - I will continue basal insulin Lantus  50 units QHS, and  Continue  Humalog  5 units TIDAC for pre-meal BG readings of 90-126m/dl, plus patient specific correction dose of rapid acting insulin  for unexpected hyperglycemia above 1533mdl, associated with strict monitoring of glucose  AC and HS. - Patient is warned not to take insulin without proper monitoring per orders. - He admits to dietary indiscretion specifically consumes large quantity of potato chips. -Adjustment parameters are given for hypo and hyperglycemia in writing. -Patient is encouraged to call clinic for blood glucose levels less than 70 or above 300 mg /dl. - I will continue  Metformin 50062mo BID, Tradjenta 5 mg by mouth daily, therapeutically suitable for patient. -Patient is not a candidate for SGLT2 inhibitors due to CKD. - If he maintains this control of diabetes [facility] of stopping his bolus insulin.  - Patient specific target  for A1c; LDL, HDL, Triglycerides, and  Waist Circumference were discussed in detail.  2) BP/HTN: Controlled. Continue current medications including ACEI/ARB. 3) Lipids/HPL:  continue statins. 4)  Weight/Diet: He is limited on how much she can exercise and carbohydrates information provided.  5) hypothyroidism:  I will continue levothyroxine 75 g by mouth every morning.    - We discussed about correct intake of levothyroxine, at fasting, with water, separated by at least 30 minutes from breakfast, and separated by more than 4 hours from calcium, iron, multivitamins, acid reflux medications (PPIs). -Patient is made aware of the fact that thyroid hormone replacement is needed for life, dose to be adjusted by periodic monitoring  of thyroid  function tests.  6) Chronic Care/Health Maintenance:  -Patient is on ACEI/ARB and Statin medications and encouraged to continue to follow up with Ophthalmology, Podiatrist at least yearly or according to recommendations, and advised to  stay away from smoking. I have recommended yearly flu vaccine and pneumonia vaccination at least every 5 years; moderate intensity exercise for up to 150 minutes weekly; and  sleep for at least 7 hours a day.  - 25 minutes of time was spent on the care of this patient , 50% of which was applied for counseling on diabetes complications and their preventions.  - I advised patient to maintain close follow up with Dr. Buelah Manis for primary care needs.  Patient is asked to bring meter and  blood glucose logs during their next visit.   Follow up plan: No Follow-up on file.  Glade Lloyd, MD Phone: [phone]  Fax: [phone]   01/02/2017, 2:10 PM

## 2017-01-03 ENCOUNTER — Ambulatory Visit (INDEPENDENT_AMBULATORY_CARE_PROVIDER_SITE_OTHER): Payer: Medicare Other | Admitting: Podiatry

## 2017-01-03 ENCOUNTER — Encounter: Payer: Self-pay | Admitting: Podiatry

## 2017-01-03 DIAGNOSIS — R609 Edema, unspecified: Secondary | ICD-10-CM

## 2017-01-03 DIAGNOSIS — B351 Tinea unguium: Secondary | ICD-10-CM | POA: Diagnosis not present

## 2017-01-03 DIAGNOSIS — E1149 Type 2 diabetes mellitus with other diabetic neurological complication: Secondary | ICD-10-CM

## 2017-01-03 NOTE — Progress Notes (Signed)
Patient ID: [name], male   DOB: [birth year], 71 y.o.   MRN: [id number]  Complaint:  Visit Type: Patient returns to my office for continued preventative foot care services. Complaint: Patient states" my nails have grown long and thick and become painful to walk and wear shoes" Patient has been diagnosed with DM with  neuropathy.  Post CVA with significant swelling both feet.[name] He presents for preventative foot care services. No changes to ROS  Podiatric Exam: Vascular: dorsalis pedis and posterior tibial pulses are not palpable due to swelling both feet.. Capillary return is immediate. Temperature gradient is WNL. Skin turgor WNL  Sensorium: Diminished  Semmes Weinstein monofilament test. Normal tactile sensation. Nail Exam: Pt has thick disfigured discolored nails with subungual debris noted bilateral entire nail hallux through fifth toenails Ulcer Exam: There is no evidence of ulcer or pre-ulcerative changes or infection. Orthopedic Exam: Muscle tone and strength are WNL. No limitations in general ROM. No crepitus or effusions noted. Foot type and digits show no abnormalities. Bony prominences are unremarkable. Skin: No Porokeratosis. No infection or ulcers  Diagnosis:  Tinea unguium, Pain in right toe, pain in left toes  Treatment & Plan Procedures and Treatment: Consent by patient was obtained for treatment procedures. The patient understood the discussion of treatment and procedures well. All questions were answered thoroughly reviewed. Debridement of mycotic and hypertrophic toenails, 1 through 5 bilateral and clearing of subungual debris. No ulceration, no infection noted. To consider diabetic shoes next visit. Return Visit-Office Procedure: Patient instructed to return to the office for a follow up visit 3 months for continued evaluation and treatment.   Gardiner Barefoot DPM

## 2017-01-05 ENCOUNTER — Telehealth: Payer: Self-pay | Admitting: *Deleted

## 2017-01-05 ENCOUNTER — Ambulatory Visit: Payer: Medicare Other | Admitting: Internal Medicine

## 2017-01-05 NOTE — Telephone Encounter (Signed)
Received request from pharmacy for [place] on [address].   PA submitted.   Dx: opioid-induced constipation K59.9.  PA approved 01/05/2017- 12/11/2017.  [place]- [id number]

## 2017-01-07 IMAGING — CT CT ABD-PELV W/ CM
2 of 5 series · 16 of 46 positions shown, 18 images · IV contrast (iopamidol)
Comparison: 06/20/2014

CLINICAL DATA: Patient developed right-sided abdominal pain and
chills while eating dinner. Nausea. History of diabetes.

EXAM:
CT ABDOMEN AND PELVIS WITH CONTRAST
TECHNIQUE: Multidetector CT imaging of the abdomen and pelvis was performed
using the standard protocol following bolus administration of
intravenous contrast.
CONTRAST:  100mL VXPCGD-IEE IOPAMIDOL (VXPCGD-IEE) INJECTION 61%

[Series 2: routine abd pel with · axial · 0.98mm/px · z∈[-525,-45]mm · 13 of 110 slices shown, 15 images]
[im 7/110  soft-tissue]
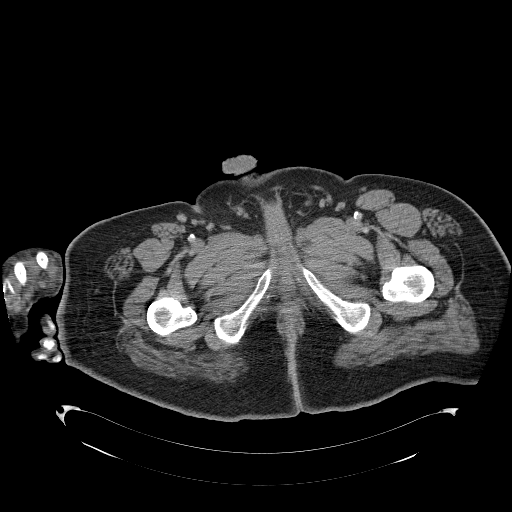
[im 7/110  bone]
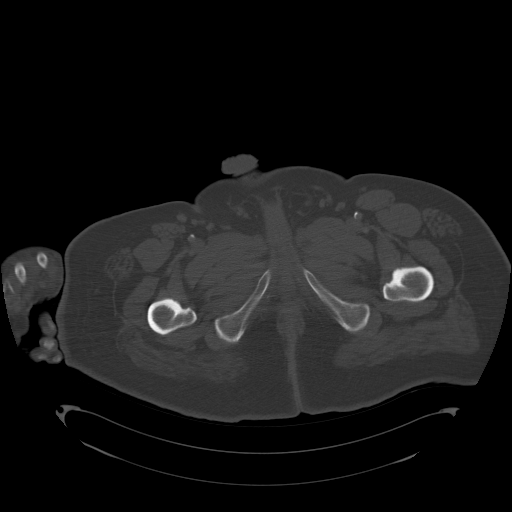
[im 13/110  soft-tissue]
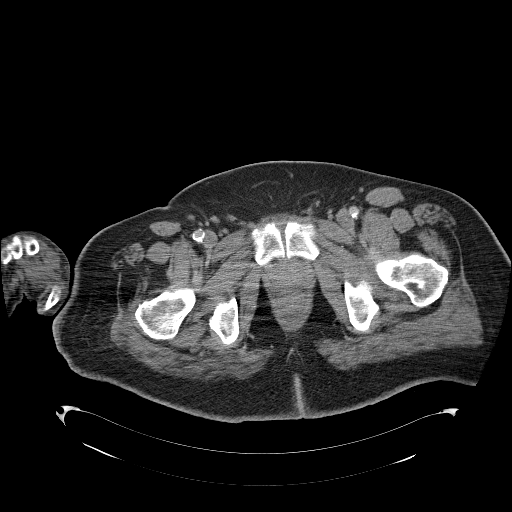
[im 26/110  soft-tissue]
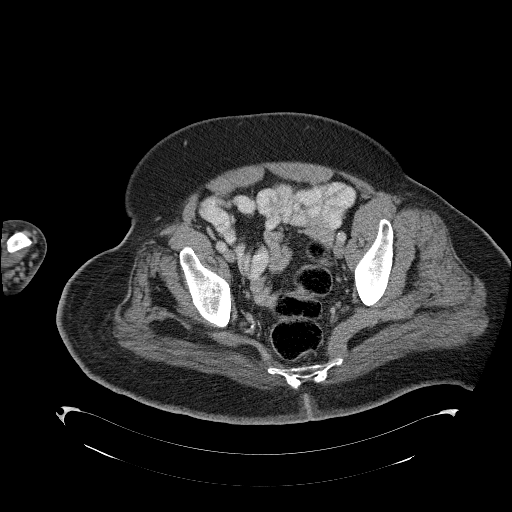
[im 33/110  soft-tissue]
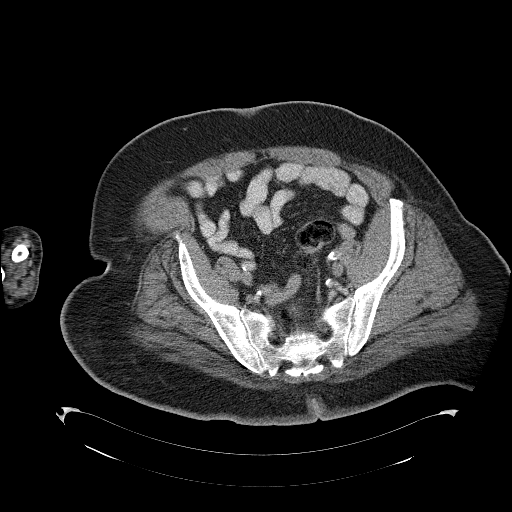
[im 39/110  soft-tissue]
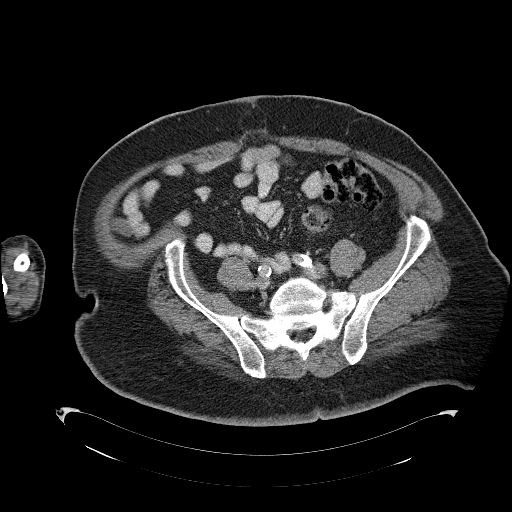
[im 45/110  soft-tissue]
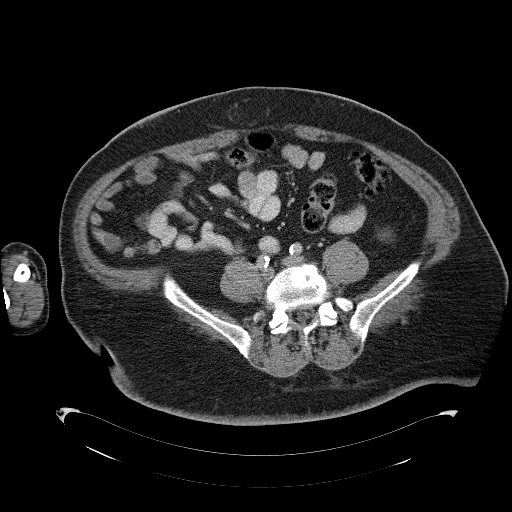
[im 58/110  soft-tissue]
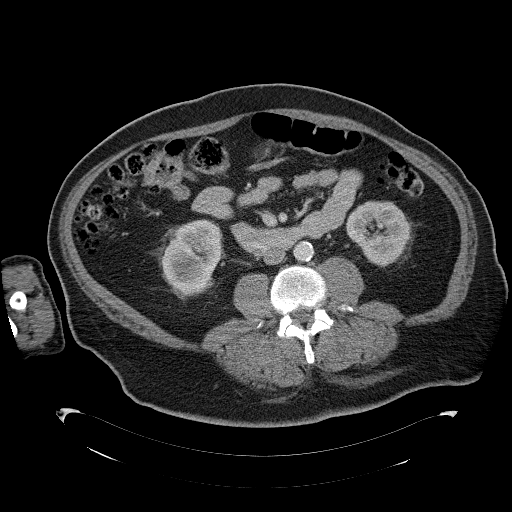
[im 65/110  soft-tissue]
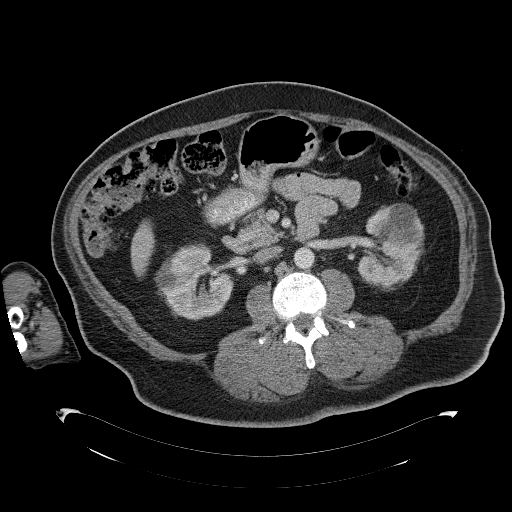
[im 71/110  soft-tissue]
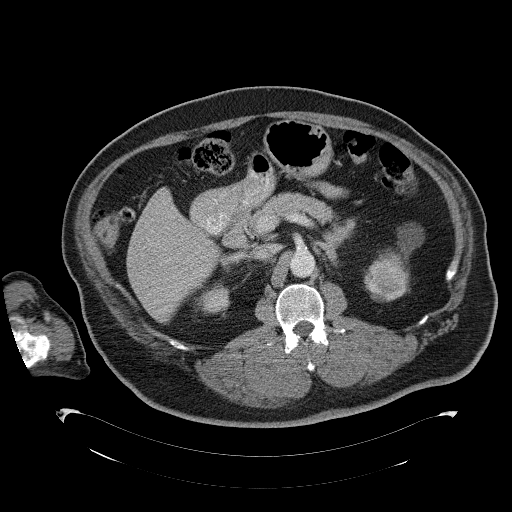
[im 71/110  bone]
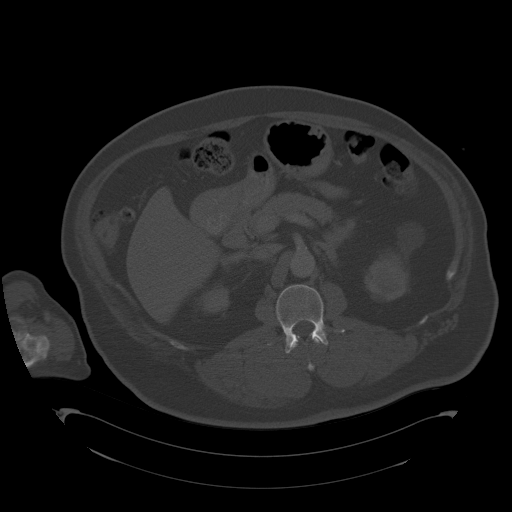
[im 77/110  soft-tissue]
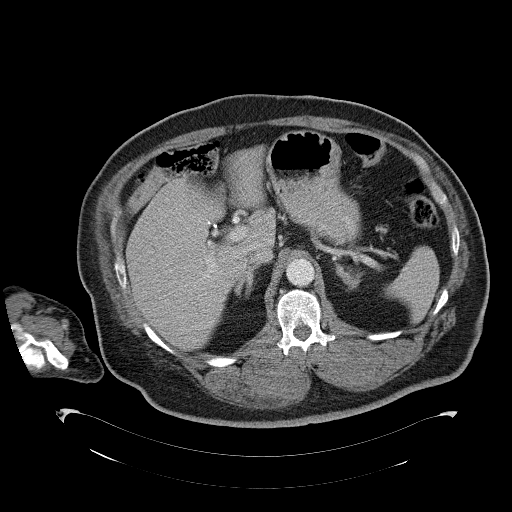
[im 84/110  soft-tissue]
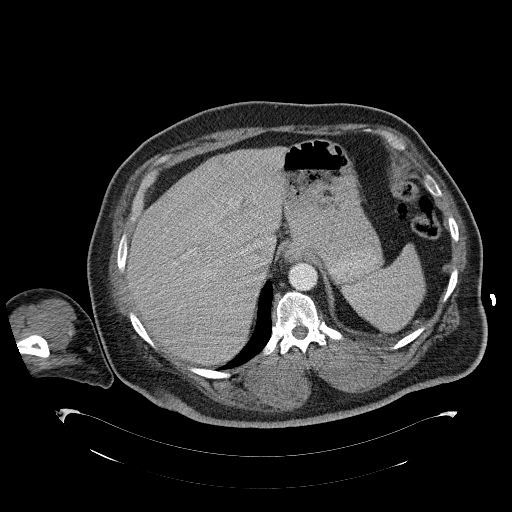
[im 97/110  soft-tissue]
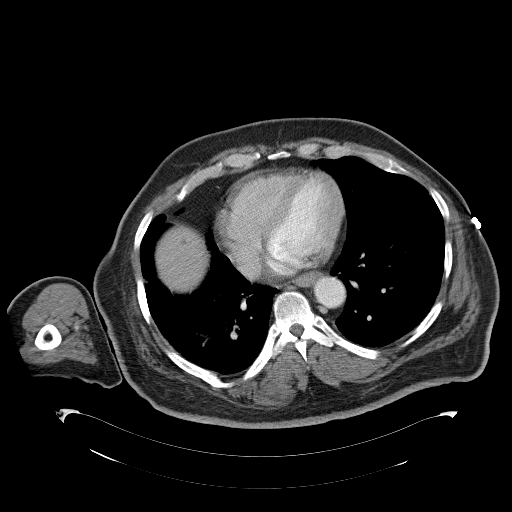
[im 103/110  soft-tissue]
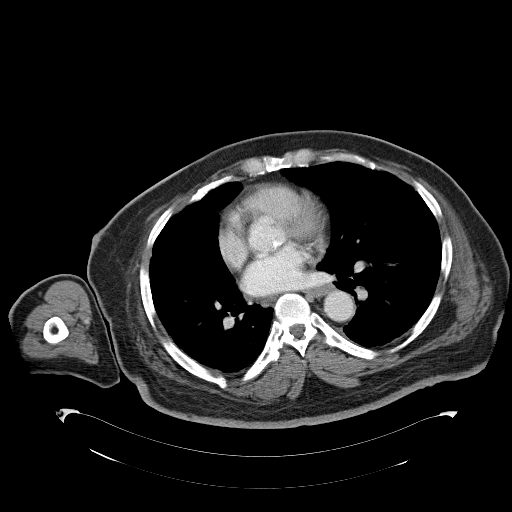

[Series 4: coronal · coronal · 0.91mm/px · 3 of 166 slices shown]
[im 56/166  soft-tissue]
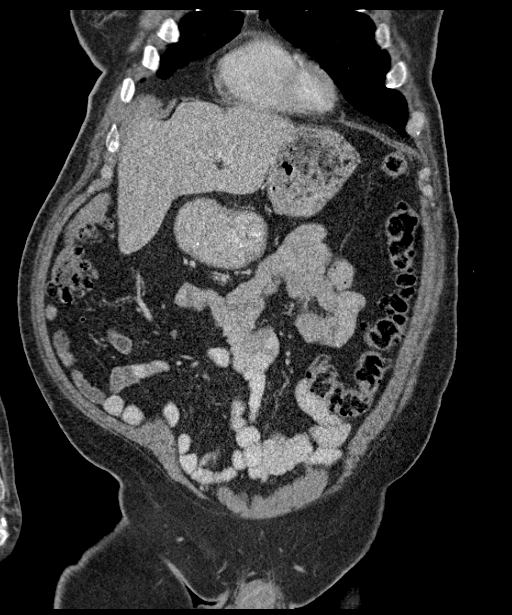
[im 74/166  soft-tissue]
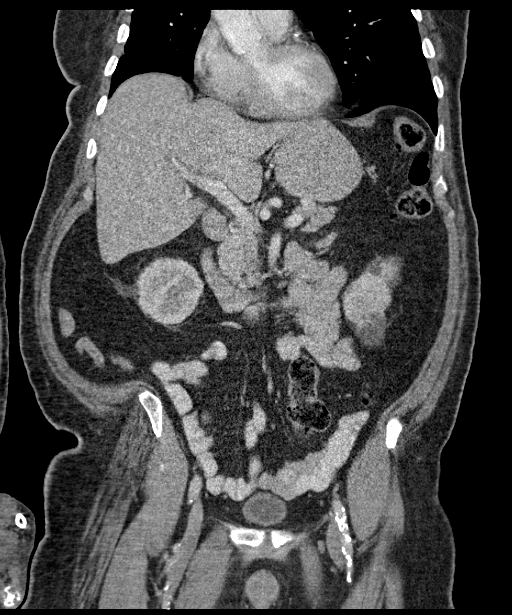
[im 92/166  soft-tissue]
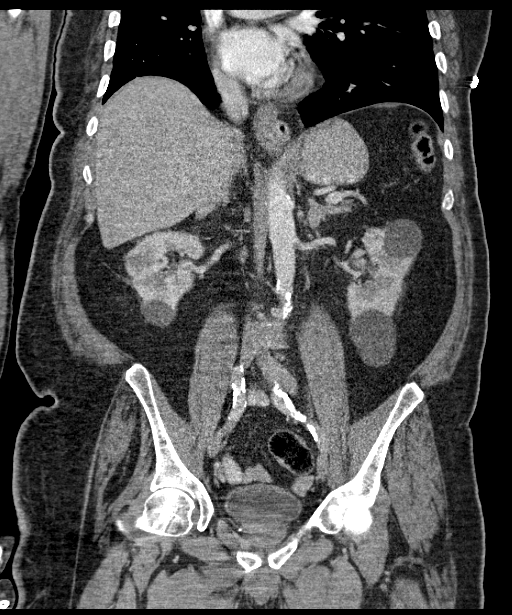

[16 of 46 positions shown; findings below may reference images not displayed]

FINDINGS: Interstitial fibrosis and scattered emphysematous changes in the
lung bases. Small esophageal hiatal hernia. Coronary artery
calcifications.

Surgical absence of the gallbladder. No bile duct dilatation.
Bilateral renal cysts are similar to prior study. No hydronephrosis.
The liver, spleen, pancreas, adrenal glands, abdominal aorta,
inferior vena cava, and retroperitoneal lymph nodes are
unremarkable. Stomach, small bowel, and colon are not abnormally
distended. No free air or free fluid in the abdomen. Ventral
periumbilical abdominal wall hernias containing fat. No bowel
herniation.

Pelvis: Prostate gland is enlarged, measuring 5.5 cm diameter.
Bladder wall is not thickened. No free or loculated pelvic fluid
collections. No pelvic mass or lymphadenopathy. Diverticulosis of
the sigmoid colon without evidence of diverticulitis. The appendix
is normal. Mixed lytic and sclerotic bone lesion within the L3
vertebral body with Schmorl's node at the inferior endplate.
Appearance is unchanged since previous study. Mild degenerative
changes in the spine.
IMPRESSION: No acute abnormality demonstrated within the abdomen or pelvis.
Periumbilical abdominal wall hernia containing fat. Prostate gland
enlargement. Unchanged appearance of vague mixed lytic and sclerotic
lesion in the L3 vertebra.

## 2017-01-11 ENCOUNTER — Other Ambulatory Visit: Payer: Self-pay | Admitting: "Endocrinology

## 2017-01-13 ENCOUNTER — Other Ambulatory Visit: Payer: Self-pay

## 2017-01-13 ENCOUNTER — Ambulatory Visit (INDEPENDENT_AMBULATORY_CARE_PROVIDER_SITE_OTHER): Payer: Medicare Other | Admitting: Gastroenterology

## 2017-01-13 ENCOUNTER — Encounter: Payer: Self-pay | Admitting: Gastroenterology

## 2017-01-13 VITALS — BP 107/78 | HR 85 | Temp 97.9°F | Ht 72.0 in

## 2017-01-13 DIAGNOSIS — R1011 Right upper quadrant pain: Secondary | ICD-10-CM

## 2017-01-13 DIAGNOSIS — K5909 Other constipation: Secondary | ICD-10-CM | POA: Diagnosis not present

## 2017-01-13 NOTE — Assessment & Plan Note (Signed)
71 year old gentleman with history of stroke/right sided weakness, presenting back in December with acute on chronic right upper quadrant pain worse with meals and with palpation. Extensive evaluation past. Previously felt that pain could be related to musculoskeletal or neuropathic component related posturing or neurological deficit status post right. Recent EGD incomplete due to retained gastric contents. Suspected gastroparesis. Also with gastritis. No evidence of eosinophilia. Colonoscopy was not performed due to inadequate bowel prep.   Plan for gastric emptying study in the near future. It is not clear that his right upper quadrant pain is related to gastroparesis at this time. He also has periumbilical hernia which may be contributing to his abdominal pain. Patient is not sure whether he wants to pursue colonoscopy. I have asked him to think about it. If he decides against colonoscopy, we could offer Cologuard Testing with caveat that he would agree to colonoscopy if it were positive. Agree with need to stay on top of constipation. Unfortunately, Movantik is too expensive  For him. When he completes current prescription we will try Amitiza 24 g twice a day with food. Samples provided.

## 2017-01-13 NOTE — Progress Notes (Signed)
Primary Care Physician: Vic Blackbird, MD  Primary Gastroenterologist:  Garfield Cornea, MD   Chief Complaint  Patient presents with  . Abdominal Pain    RUQ  . Gastroesophageal Reflux    HPI: [name] is a 71 y.o. male here For follow-up of recent procedures, EGD, colonoscopy not done because patient failed to prep. He has a history of stroke, right-sided weakness, chronic right upper quadrant abdominal pain, Barrett's esophagus. Previously felt to be due to musculoskeletal or neuropathic component related to his posturing or neurological deficit status post stroke.  To complicate matters, patient has had history of elevated transaminases, peripheral eosinophilia in the past which responded to prednisone. He had a liver biopsy in January 2009 showing steatohepatitis MRCP January 2009 for abnormal LFTs and right upper quadrant pain which was normal. Previous negative viral markers, ANA, AMA, ASMA. Normal alpha-1 antitrypsin level and ceruloplasmin and iron.  CT June 5732 with periumbilical abdominal wall hernia containing fat.    failed attempt for colonoscopy due to inadequate prep. According to patient and wife, he took the prep according to instructions. Was seen clear stools. He had reactive gastritis, stomach full of food which precluded exam, Barrett's esophagus with no dysplasia, normal appearing duodenum with no evidence of eosinophilia on biopsy.  Suspected gastroparesis,  GES in the near future. Patient is unsure whether or not he wants to try prepping again for colonoscopy. He will think about it. He continues to have right upper quadrant pain which is constant. He states it is definitely worse with meals. Rates it a 5 on a scale of 0-10 with 10 being the worst pain. States he was started on movantik  By his PCP after failed colonoscopy. He is having a bowel movement about every 3 days, soft stool. States Movantik  Is very expensive. He just got it filled and started a  few days ago. No melena rectal bleeding. In the past he has failed Metamucil. He has taken Ex-Lax. Never tried MiraLAX.       Current Outpatient Prescriptions  Medication Sig Dispense Refill  . allopurinol (ZYLOPRIM) 300 MG tablet TAKE 1 TABLET BY MOUTH EVERY DAY 90 tablet 0  . amLODipine (NORVASC) 10 MG tablet TAKE 1 TABLET BY MOUTH DAILY 90 tablet 0  . cloNIDine (CATAPRES) 0.2 MG tablet TAKE 1 TABLET BY MOUTH THREE TIMES DAILY 270 tablet 0  . clotrimazole-betamethasone (LOTRISONE) cream Apply 1 application topically 2 (two) times daily. (Patient taking differently: Apply 1 application topically 2 (two) times daily. Applied to affected areas of skin on legs and arms (do not apply to face and/or groin)) 45 g 1  . furosemide (LASIX) 40 MG tablet TAKE 1 TABLET BY MOUTH DAILY 90 tablet 0  . gabapentin (NEURONTIN) 300 MG capsule TAKE 2 CAPSULES(600 MG) BY MOUTH THREE TIMES DAILY 540 capsule 0  . Insulin Glargine (LANTUS SOLOSTAR) 100 UNIT/ML Solostar Pen Inject 50 Units into the skin at bedtime. 15 mL 2  . insulin lispro (HUMALOG KWIKPEN) 100 UNIT/ML KiwkPen Inject 0.05-0.11 mLs (5-11 Units total) into the skin 3 (three) times daily with meals. (Patient taking differently: Inject 5-11 Units into the skin 3 (three) times daily with meals. Sliding scale  90-149=5 units, 150-199=6 units, 200-249=7 units, 250-299=8 units, 300-349=9 units,  350-349=10 units, & greater than 350=11 units) 15 mL 2  . LANTUS SOLOSTAR 100 UNIT/ML Solostar Pen INJECT 50 UNITS SUBCUTANEOUSLY EVERY NIGHT AT BEDTIME 15 mL 2  . levothyroxine (SYNTHROID, LEVOTHROID) 75 MCG  tablet Take 1 tablet (75 mcg total) by mouth daily. 30 tablet 3  . linagliptin (TRADJENTA) 5 MG TABS tablet Take 1 tablet by mouth  daily (Patient taking differently: Take 5 mg by mouth daily. ) 90 tablet 2  . lisinopril (PRINIVIL,ZESTRIL) 20 MG tablet TAKE 1 TABLET BY MOUTH DAILY 90 tablet 0  . metFORMIN (GLUCOPHAGE) 500 MG tablet TAKE 1 TABLET BY MOUTH TWICE  DAILY 180 tablet 0  . metoprolol succinate (TOPROL-XL) 50 MG 24 hr tablet TAKE 1 TABLET BY MOUTH DAILY WITH OR IMMEDIATELY FOLLOWING A MEAL 90 tablet 0  . naloxegol oxalate (MOVANTIK) 25 MG TABS tablet Take 1 tablet (25 mg total) by mouth daily. 30 tablet 0  . ONETOUCH VERIO test strip TEST FOUR TIMES DAILY 150 each 3  . oxyCODONE-acetaminophen (PERCOCET) 7.5-325 MG tablet Take 1 tablet by mouth every 4 (four) hours as needed. 180 tablet 0  . pantoprazole (PROTONIX) 40 MG tablet TAKE 1 TABLET BY MOUTH DAILY 90 tablet 0  . potassium chloride SA (K-DUR,KLOR-CON) 20 MEQ tablet TAKE 1 TABLET(20 MEQ) BY MOUTH DAILY 30 tablet 0  . simvastatin (ZOCOR) 20 MG tablet TAKE 1 TABLET BY MOUTH EVERY NIGHT AT BEDTIME 90 tablet 0  . tamsulosin (FLOMAX) 0.4 MG CAPS capsule TAKE 1 CAPSULE BY MOUTH DAILY 90 capsule 0  . tiZANidine (ZANAFLEX) 2 MG tablet TAKE 1 TABLET BY MOUTH TWICE DAILY AS NEEDED (Patient taking differently: TAKE 1 TABLET BY MOUTH TWICE DAILY) 180 tablet 0   No current facility-administered medications for this visit.     Allergies as of 01/13/2017  . (No Known Allergies)   Past Medical History:  Diagnosis Date  . Arthritis   . Asthma    "hx of"  . Barrett's esophagus    EGD 03/23/2011 & EGD 2/09 bx proven  . BPH (benign prostatic hyperplasia)   . Cancer of parotid gland ([place]) 11/23/12   Adenocarcinoma  . Chronic abdominal pain   . Colon polyp 03/23/2011   tubular adenoma, Dr. Gala Romney  . 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 ([place]) 07/15/2011  . CVA (cerebral infarction) 1998   right sided deficit  . Diverticulosis    TCS 03/23/11 pancolonic diverticula &TCS 5/08, pancolonic diverticula  . DM (diabetes mellitus) ([place])   . Edema of lower extremity 12/21/12   bilateral   . Esotropia of left eye   . GERD (gastroesophageal reflux disease)   . Glaucoma (increased eye pressure)   . Gout   . Gout   . Hemorrhagic colitis 06/06/2012.  [name] Hemorrhoids, internal 03/23/2011   tcs by  Dr. Gala Romney  . Hepatitis    esosiniphilic, tx with prednisone  . Hiatal hernia   . HTN (hypertension)   . Hx of radiation therapy 1974   right base of skull area-lymphoma  . Hyperlipidemia   . Lower facial weakness    Right  . Lymphoma ([place]) 1974   XRT at [facility], right base of skull area  . Neuropathy ([place])   . Renal insufficiency   . Steatohepatitis    liver biopsy 2009  . Stroke [facility]) 1998   right hemiparesis/plegia   Past Surgical History:  Procedure Laterality Date  . BIOPSY  12/01/2016   Procedure: BIOPSY;  Surgeon: Daneil Dolin, MD;  [facility];  Service: Endoscopy;;  duodenum, gastric, esophagus  . CHOLECYSTECTOMY    . COLONOSCOPY  03/23/11   Dr. Gala Romney  pancolonic diverticula, hemorrhoids, tubular adenoma.. next tcs 03/2016  . COLONOSCOPY  WITH PROPOFOL N/A 12/01/2016   inadequate bowel prep precluded exam  . ESOPHAGOGASTRODUODENOSCOPY  02/05/08   goblet cell metaplasia/negative for H.pylori  . ESOPHAGOGASTRODUODENOSCOPY  03/23/11   Dr. Gala Romney, barretts, hiatal hernia  . ESOPHAGOGASTRODUODENOSCOPY (EGD) WITH PROPOFOL N/A 12/01/2016   Dr. Gala Romney: Large amount of retained gastric contents precluded completion of the stomach. Mucosal changes were found in the stomach. Erosions and somewhat scalloped appearing mucosa present, reactive gastritis/no H pylori. Barrett's esophagus noted, no dysplasia on biopsy. Duodenal biopsies taken as well, benign, no evidence of eosinophilia.  [name] MASS BIOPSY  11/01/2012   Procedure: NECK MASS BIOPSY;  Surgeon: Ascencion Dike, MD;  Location: [facility] OR[facility];  Service: ENT;  Laterality: Right;  Excisional Bx Right Neck Mass; attempted external jugular cutdown of left side  . PAROTIDECTOMY  11/24/2012   Procedure: PAROTIDECTOMY;  Surgeon: Ascencion Dike, MD;  Location: [place];  Service: ENT;  Laterality: N/A;  Total parotidectomy  . PLEURECTOMY    . right lymph node removal Right    behind right ear  . Right video-assisted thoracic surgery, pleurectomy, and  pleurodesis  2011    ROS:  General: Negative for anorexia, weight loss, fever, chills, fatigue, weakness. ENT: Negative for hoarseness, difficulty swallowing , nasal congestion. CV: Negative for chest pain, angina, palpitations, dyspnea on exertion, peripheral edema.  Respiratory: Negative for dyspnea at rest, dyspnea on exertion, cough, sputum, wheezing.  GI: See history of present illness. GU:  Negative for dysuria, hematuria, urinary incontinence, urinary frequency, nocturnal urination.  Endo: Negative for unusual weight change.    Physical Examination:   BP 107/78   Pulse 85   Temp 97.9 F (36.6 C) (Oral)   Ht 6' (1.829 m)   General: Well-nourished, well-developed in no acute distress.  Eyes: No icterus. Mouth: Oropharyngeal mucosa moist and pink , no lesions erythema or exudate. Lungs: Clear to auscultation bilaterally.  Heart: Regular rate and rhythm, no murmurs rubs or gallops.  Abdomen: Bowel sounds are normal, tenderness with ruq palpation, nondistended, no hepatosplenomegaly or masses, no abdominal bruits or hernia , no rebound or guarding.  Had to examine in wheelchair given right sided weakness. Patient is barely able to weight bear/pivot to transfer.  Extremities: 1-2+ lower extremity edema. No clubbing or deformities. Neuro: Alert and oriented x 4   Skin: Warm and dry, no jaundice.   Psych: Alert and cooperative, normal mood and affect.  Labs:  Lab Results  Component Value Date   CREATININE 1.66 (H) 12/27/2016   BUN 20 12/27/2016   NA 138 12/27/2016   K 6.1 (H) 12/27/2016   CL 105 12/27/2016   CO2 26 12/27/2016   Lab Results  Component Value Date   ALT 11 12/27/2016   AST 12 12/27/2016   ALKPHOS 74 12/27/2016   BILITOT 0.3 12/27/2016   Lab Results  Component Value Date   HGBA1C 6.9 (H) 12/27/2016    Imaging Studies: No results found.

## 2017-01-13 NOTE — Patient Instructions (Addendum)
1. Gastric emptying study as scheduled. You will need to hold your pain medication (oxycodone) and the Movantik for 24 hours before the study.  2. Think about wether you want to attempt another colonoscopy. Let me know when we touch base with test results. If you do not, we may consider cologuard testing for colon cancer.  3. When you run out of Movantik, try Amitiza 68mg twice daily with food. It may be cheaper for you. I have provided samples today.

## 2017-01-15 ENCOUNTER — Other Ambulatory Visit: Payer: Self-pay | Admitting: Family Medicine

## 2017-01-16 NOTE — Progress Notes (Signed)
CC'ED TO PCP 

## 2017-01-17 ENCOUNTER — Encounter (HOSPITAL_COMMUNITY)
Admission: RE | Admit: 2017-01-17 | Discharge: 2017-01-17 | Disposition: A | Discharge status: Home / Self Care | Payer: Medicare Other | Source: Ambulatory Visit | Attending: Gastroenterology | Admitting: Gastroenterology

## 2017-01-17 ENCOUNTER — Encounter (HOSPITAL_COMMUNITY): Payer: Self-pay

## 2017-01-17 DIAGNOSIS — R1011 Right upper quadrant pain: Secondary | ICD-10-CM | POA: Diagnosis not present

## 2017-01-17 MED ORDER — TECHNETIUM TC 99M SULFUR COLLOID
2.0000 | Freq: Once | INTRAVENOUS | Status: AC | PRN
  Administered 2017-01-17: 2.01 via ORAL

## 2017-01-17 MED ORDER — SODIUM CHLORIDE 0.9% FLUSH
INTRAVENOUS | Status: AC
  Filled 2017-01-17: qty 180

## 2017-01-19 ENCOUNTER — Other Ambulatory Visit: Payer: Self-pay | Admitting: "Endocrinology

## 2017-01-22 NOTE — Progress Notes (Signed)
Please let patient know that he has some delay in his gastric emptying.  Please send him information about gastroparesis.  Please encourage him to eat 5-6 SMALL meals daily instead of 3 bigger meals daily. Eat lowfat foods which are easier to digest. Sometimes raw vegetables will be harder to digest as well.   Return to office to see rmr only in 6 weeks.

## 2017-01-25 ENCOUNTER — Encounter: Payer: Self-pay | Admitting: Internal Medicine

## 2017-01-25 NOTE — Progress Notes (Signed)
APPT MADE AND LETTER SENT  °

## 2017-02-02 ENCOUNTER — Other Ambulatory Visit: Payer: Self-pay | Admitting: Family Medicine

## 2017-02-02 ENCOUNTER — Other Ambulatory Visit: Payer: Self-pay | Admitting: "Endocrinology

## 2017-02-03 NOTE — Telephone Encounter (Signed)
LRF 11/07/16 #180   LOV 12/20/16  Ok refill?

## 2017-02-04 NOTE — Telephone Encounter (Signed)
okay

## 2017-02-08 ENCOUNTER — Telehealth: Payer: Self-pay | Admitting: *Deleted

## 2017-02-08 NOTE — Telephone Encounter (Signed)
Received fax requesting refill on Zanaflex.   Ok to refill?

## 2017-02-08 NOTE — Telephone Encounter (Signed)
Okay to refill? 

## 2017-02-09 MED ORDER — TIZANIDINE HCL 2 MG PO TABS: 2.0000 mg | ORAL_TABLET | Freq: Two times a day (BID) | ORAL | 0 refills | Status: DC | PRN

## 2017-02-09 NOTE — Telephone Encounter (Signed)
Prescription sent to pharmacy.

## 2017-02-10 ENCOUNTER — Telehealth: Payer: Self-pay | Admitting: *Deleted

## 2017-02-10 NOTE — Telephone Encounter (Signed)
KIM please call and see what the issue was, do they need a new provider
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
## 2017-02-14 NOTE — Telephone Encounter (Signed)
Spoke to wife and she is aware of new appt.

## 2017-02-24 ENCOUNTER — Other Ambulatory Visit: Payer: Self-pay | Admitting: Family Medicine

## 2017-03-07 ENCOUNTER — Ambulatory Visit (INDEPENDENT_AMBULATORY_CARE_PROVIDER_SITE_OTHER): Payer: Medicare Other | Admitting: Internal Medicine

## 2017-03-07 ENCOUNTER — Encounter: Payer: Self-pay | Admitting: Internal Medicine

## 2017-03-07 VITALS — BP 126/80 | HR 72 | Temp 97.6°F | Ht 72.0 in

## 2017-03-07 DIAGNOSIS — K3184 Gastroparesis: Secondary | ICD-10-CM | POA: Diagnosis not present

## 2017-03-07 DIAGNOSIS — K5900 Constipation, unspecified: Secondary | ICD-10-CM | POA: Diagnosis not present

## 2017-03-07 DIAGNOSIS — K219 Gastro-esophageal reflux disease without esophagitis: Secondary | ICD-10-CM | POA: Diagnosis not present

## 2017-03-07 MED ORDER — LUBIPROSTONE 24 MCG PO CAPS: 24.0000 ug | ORAL_CAPSULE | Freq: Two times a day (BID) | ORAL | 11 refills | Status: DC

## 2017-03-07 NOTE — Progress Notes (Signed)
Primary Care Physician:  Vic Blackbird, MD Primary Gastroenterologist:  Dr. Gala Romney  Pre-Procedure History & Physical: HPI:  [name] is a 71 y.o. male here for follow-up of abdominal pain, constipation, GERD and gastroparesis. Couldn't afford Movantik. Took Amitiza prescribed here for 2 weeks; did very well with it. Did not get a prescription. Amitiza much more reasonably priced than Movantik. On daily opioid therapy. He snacks throughout the day;  no major meal as previously directed. Hasn't had any rectal bleeding or melena. History of tubular adenoma previously; he was slated for screening surveillance colonoscopy last year. Poor prep precluded that happening. He has multiple comorbidities now and is not interested in having a surveillance colonoscopy.  Reflux symptoms well controlled on Protonix 40 mg daily  Past Medical History:  Diagnosis Date  . Arthritis   . Asthma    "hx of"  . Barrett's esophagus    EGD 03/23/2011 & EGD 2/09 bx proven  . BPH (benign prostatic hyperplasia)   . Cancer of parotid gland ([place]) 11/23/12   Adenocarcinoma  . Chronic abdominal pain   . Colon polyp 03/23/2011   tubular adenoma, Dr. Gala Romney  . 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 ([place]) 07/15/2011  . CVA (cerebral infarction) 1998   right sided deficit  . Diverticulosis    TCS 03/23/11 pancolonic diverticula &TCS 5/08, pancolonic diverticula  . DM (diabetes mellitus) ([place])   . Edema of lower extremity 12/21/12   bilateral   . Esotropia of left eye   . GERD (gastroesophageal reflux disease)   . Glaucoma (increased eye pressure)   . Gout   . Gout   . Hemorrhagic colitis 06/06/2012.  [name] Hemorrhoids, internal 03/23/2011   tcs by Dr. Gala Romney  . Hepatitis    esosiniphilic, tx with prednisone  . Hiatal hernia   . HTN (hypertension)   . Hx of radiation therapy 1974   right base of skull area-lymphoma  . Hyperlipidemia   . Lower facial weakness    Right  . Lymphoma ([place]) 1974   XRT at  [facility], right base of skull area  . Neuropathy ([place])   . Renal insufficiency   . Steatohepatitis    liver biopsy 2009  . Stroke [facility]) 1998   right hemiparesis/plegia    Past Surgical History:  Procedure Laterality Date  . BIOPSY  12/01/2016   Procedure: BIOPSY;  Surgeon: Daneil Dolin, MD;  [facility];  Service: Endoscopy;;  duodenum, gastric, esophagus  . CHOLECYSTECTOMY    . COLONOSCOPY  03/23/11   Dr. Gala Romney  pancolonic diverticula, hemorrhoids, tubular adenoma.. next tcs 03/2016  . COLONOSCOPY WITH PROPOFOL N/A 12/01/2016   inadequate bowel prep precluded exam  . ESOPHAGOGASTRODUODENOSCOPY  02/05/08   goblet cell metaplasia/negative for H.pylori  . ESOPHAGOGASTRODUODENOSCOPY  03/23/11   Dr. Gala Romney, barretts, hiatal hernia  . ESOPHAGOGASTRODUODENOSCOPY (EGD) WITH PROPOFOL N/A 12/01/2016   Dr. Gala Romney: Large amount of retained gastric contents precluded completion of the stomach. Mucosal changes were found in the stomach. Erosions and somewhat scalloped appearing mucosa present, reactive gastritis/no H pylori. Barrett's esophagus noted, no dysplasia on biopsy. Duodenal biopsies taken as well, benign, no evidence of eosinophilia.  [name] MASS BIOPSY  11/01/2012   Procedure: NECK MASS BIOPSY;  Surgeon: Ascencion Dike, MD;  Location: [facility];  Service: ENT;  Laterality: Right;  Excisional Bx Right Neck Mass; attempted external jugular cutdown of left side  . PAROTIDECTOMY  11/24/2012   Procedure: PAROTIDECTOMY;  Surgeon:  Ascencion Dike, MD;  Location: [place];  Service: ENT;  Laterality: N/A;  Total parotidectomy  . PLEURECTOMY    . right lymph node removal Right    behind right ear  . Right video-assisted thoracic surgery, pleurectomy, and pleurodesis  2011    Prior to Admission medications   Medication Sig Start Date End Date Taking? Authorizing Provider  allopurinol (ZYLOPRIM) 300 MG tablet TAKE 1 TABLET BY MOUTH EVERY DAY 01/16/17  Yes [name], MD  amLODipine (NORVASC) 10 MG tablet  TAKE 1 TABLET BY MOUTH DAILY 12/23/16  Yes [name], MD  cloNIDine (CATAPRES) 0.2 MG tablet TAKE 1 TABLET BY MOUTH THREE TIMES DAILY 12/19/16  Yes [name], MD  clotrimazole-betamethasone (LOTRISONE) cream Apply 1 application topically 2 (two) times daily. Patient taking differently: Apply 1 application topically as needed. Applied to affected areas of skin on legs and arms (do not apply to face and/or groin) 08/16/16  Yes [name], MD  furosemide (LASIX) 40 MG tablet TAKE 1 TABLET BY MOUTH DAILY 01/16/17  Yes [name], MD  gabapentin (NEURONTIN) 300 MG capsule TAKE 2 CAPSULES(600 MG) BY MOUTH THREE TIMES DAILY 11/07/16  Yes [name], MD  Insulin Glargine (LANTUS SOLOSTAR) 100 UNIT/ML Solostar Pen Inject 50 Units into the skin at bedtime. 07/18/16  Yes Cassandria Anger, MD  insulin lispro (HUMALOG KWIKPEN) 100 UNIT/ML KiwkPen Inject 0.05-0.11 mLs (5-11 Units total) into the skin 3 (three) times daily with meals. Patient taking differently: Inject 5-11 Units into the skin 3 (three) times daily with meals. Sliding scale  90-149=5 units, 150-199=6 units, 200-249=7 units, 250-299=8 units, 300-349=9 units,  350-349=10 units, & greater than 350=11 units 03/11/16  Yes Cassandria Anger, MD  LANTUS SOLOSTAR 100 UNIT/ML Solostar Pen INJECT 50 UNITS SUBCUTANEOUSLY EVERY NIGHT AT BEDTIME 01/11/17  Yes Cassandria Anger, MD  levothyroxine (SYNTHROID, LEVOTHROID) 75 MCG tablet Take 1 tablet (75 mcg total) by mouth daily. 09/26/16  Yes Cassandria Anger, MD  lisinopril (PRINIVIL,ZESTRIL) 20 MG tablet TAKE 1 TABLET BY MOUTH DAILY 12/23/16  Yes [name], MD  metFORMIN (GLUCOPHAGE) 500 MG tablet TAKE 1 TABLET BY MOUTH TWICE DAILY 02/02/17  Yes Cassandria Anger, MD  metoprolol succinate (TOPROL-XL) 50 MG 24 hr tablet TAKE 1 TABLET BY MOUTH DAILY WITH OR IMMEDIATELY FOLLOWING A MEAL 12/19/16  Yes [name], MD  [facility] DELICA LANCETS FINE MISC USE FOUR TIMES  DAILY 01/19/17  Yes Cassandria Anger, MD  [facility] VERIO test strip TEST FOUR TIMES DAILY 10/31/16  Yes Cassandria Anger, MD  oxyCODONE-acetaminophen (PERCOCET) 7.5-325 MG tablet Take 1 tablet by mouth every 4 (four) hours as needed. 12/20/16  Yes [name], MD  pantoprazole (PROTONIX) 40 MG tablet TAKE 1 TABLET BY MOUTH DAILY 01/16/17  Yes [name], MD  potassium chloride SA (K-DUR,KLOR-CON) 20 MEQ tablet TAKE 1 TABLET(20 MEQ) BY MOUTH DAILY 12/09/16  Yes [name], MD  potassium chloride SA (K-DUR,KLOR-CON) 20 MEQ tablet TAKE 1 TABLET(20 MEQ) BY MOUTH DAILY 02/24/17  Yes [name], MD  simvastatin (ZOCOR) 20 MG tablet TAKE 1 TABLET BY MOUTH EVERY NIGHT AT BEDTIME 02/24/17  Yes [name], MD  tamsulosin (FLOMAX) 0.4 MG CAPS capsule TAKE 1 CAPSULE BY MOUTH DAILY 10/24/16  Yes [name], MD  tiZANidine (ZANAFLEX) 2 MG tablet Take 1 tablet (2 mg total) by mouth 2 (two) times daily as needed. 02/09/17  Yes Lonell Grandchild  Donato Schultz, MD  linagliptin (TRADJENTA) 5 MG TABS tablet Take 1 tablet by mouth  daily Patient not taking: Reported on 03/07/2017 09/09/15   Cassandria Anger, MD  naloxegol oxalate (MOVANTIK) 25 MG TABS tablet Take 1 tablet (25 mg total) by mouth daily. Patient not taking: Reported on 03/07/2017 12/20/16   [name], MD    Allergies as of 03/07/2017  . (No Known Allergies)    Family History  Problem Relation Age of Onset  . Heart failure Mother   . Heart failure Father   . Heart failure Sister   . Heart failure Son     Social History   Social History  . Marital status: Married    Spouse name: N/A  . Number of children: 1  . Years of education: N/A   Occupational History  . disabled    Social History Main Topics  . Smoking status: Former Smoker    Packs/day: 3.00    Years: 25.00    Types: Cigarettes    Quit date: 03/01/1997  . Smokeless tobacco: Never Used  . Alcohol use No  . Drug use: No  . Sexual activity: No    Other Topics Concern  . Not on file   Social History Narrative  . No narrative on file    Review of Systems: See HPI, otherwise negative ROS  Physical Exam: BP 126/80   Pulse 72   Temp 97.6 F (36.4 C) (Oral)   Ht 6' (1.829 m)  General:   Awake alert conversant wheelchair bound. Left facial droop and ptosis present  Abdomen: Nondistended. Positive bowel sounds. No succussion splash. Mild right-sided abdominal tenderness. No appreciable mass or organomegaly. Pulses:  Normal pulses noted. Extremities:  Without clubbing or edema.  Impression:  Pleasant 71 year old gentleman debilitated secondary to CVA with multiple other comorbidities including GERD, multifactorial constipation, gastroparesis and abdominal pain. CT and EGD last year without significant findings. History of colonic adenoma. Colonoscopy could not be accomplished last year because prep issues.  At this time, I'm not sure the benefits outweigh the risks. Moreover, patient really not interested in having a colonoscopy. He seems to understand the risks either way.  Recommendations:  Resume Amitiza 24 microgram gel caps twice daily during meals (disp - 60 with 11 refills).  Continue protonix 40 mg daily  As discussed, will hold off on a colonoscopy at this time at pt request as the benefits may not be worth the risks  Gastroparesis diet  Office visit in 4 months      Notice: This dictation was prepared with Dragon dictation along with smaller phrase technology. Any transcriptional errors that result from this process are unintentional and may not be corrected upon review.

## 2017-03-07 NOTE — Patient Instructions (Addendum)
Resume Amitiza 24 microgram gel caps twice daily during meals (disp - 60 with 11 refills) Continue protonix 40 mg daily  As discussed, will hold off on a colonoscopy at this time at your request as the benefits may not be worth the risks  Gastroparesis diet  Office visit in 4 months

## 2017-03-08 ENCOUNTER — Telehealth: Payer: Self-pay | Admitting: *Deleted

## 2017-03-08 ENCOUNTER — Other Ambulatory Visit: Payer: Self-pay | Admitting: Family Medicine

## 2017-03-08 MED ORDER — SULFAMETHOXAZOLE-TRIMETHOPRIM 800-160 MG PO TABS: 1.0000 | ORAL_TABLET | Freq: Two times a day (BID) | ORAL | 0 refills | Status: DC

## 2017-03-08 NOTE — Telephone Encounter (Signed)
Call placed to patient and patient made aware.   Prescription sent to pharmacy.  

## 2017-03-08 NOTE — Telephone Encounter (Signed)
Received call from patient wife, [name].   States that patient has boil on back that has ruptured and is requesting ABTx.   MD please advise.

## 2017-03-08 NOTE — Telephone Encounter (Signed)
Send Bactrim DS 1 tab BID for 7 days, if not improving, they can call the dermatolgist back directly or come here

## 2017-03-20 ENCOUNTER — Ambulatory Visit: Payer: Medicare Other | Admitting: Internal Medicine

## 2017-03-21 ENCOUNTER — Other Ambulatory Visit: Payer: Self-pay | Admitting: Family Medicine

## 2017-03-28 ENCOUNTER — Other Ambulatory Visit: Payer: Self-pay | Admitting: Family Medicine

## 2017-04-04 ENCOUNTER — Telehealth: Payer: Self-pay | Admitting: Podiatry

## 2017-04-04 ENCOUNTER — Telehealth: Payer: Self-pay | Admitting: *Deleted

## 2017-04-04 ENCOUNTER — Ambulatory Visit: Payer: Medicare Other | Admitting: Podiatry

## 2017-04-04 NOTE — Telephone Encounter (Signed)
Pts wife asked why do they get additional bills after paying the copay. Please call pts wife about billing.  pts wife called and was upset about having to sign the abn. They have never signed it before. I explained that we are having to do this now.She said she called insurance company and they said it would be covered. She was not happy about how the front desk replied to her when they questioned them.(they don't make the rules)

## 2017-04-04 NOTE — Telephone Encounter (Signed)
Received call from patient wife, [name].   States that she was to have patient's nails trimmed at [facility] on 04/04/2017. Reports that while in room, staff asked her to sign form that states that if insurance does not cover cost of nail trimming, the patient will be responsible for the bill. Reports that she found this to be upsetting and would like referral to new podiatrist.   Attempted to advise patient wife that all medical practices have patient's sign this type of form as part of the patient responsibility to the office. Advised that even new podiatry office will have her sign this form as it is protocol for medical office.   Patient wife states that she feels that this could have easily been solved with phone call from MD to insurance to see what will be covered. Apologized to patient that this is not the policy of most medical offices. Advised that while most insurance companies pay what is charged of them, or negotiate for a portion of the bill, it is ultimately the patient responsibility to contact the insurance to determine what will be covered. Advised that unfortunately, the office staff of a medical office usually does not have the amount of time to spare that it would require to contact the insurance company for all patient's for all procedures that could be billed.   Patient wife notably upset and hung up phone. MD made aware.

## 2017-04-05 ENCOUNTER — Other Ambulatory Visit: Payer: Self-pay | Admitting: "Endocrinology

## 2017-04-12 ENCOUNTER — Encounter: Payer: Self-pay | Admitting: Podiatry

## 2017-04-12 ENCOUNTER — Ambulatory Visit (INDEPENDENT_AMBULATORY_CARE_PROVIDER_SITE_OTHER): Payer: Medicare Other | Admitting: Podiatry

## 2017-04-12 DIAGNOSIS — M79676 Pain in unspecified toe(s): Secondary | ICD-10-CM | POA: Diagnosis not present

## 2017-04-12 DIAGNOSIS — B351 Tinea unguium: Secondary | ICD-10-CM | POA: Diagnosis not present

## 2017-04-12 DIAGNOSIS — E1149 Type 2 diabetes mellitus with other diabetic neurological complication: Secondary | ICD-10-CM

## 2017-04-12 NOTE — Progress Notes (Signed)
Patient ID: [name], male   DOB: 1946/06/08, 71 y.o.   MRN: [id number]  Complaint:  Visit Type: Patient returns to my office for continued preventative foot care services. Complaint: Patient states" my nails have grown long and thick and become painful to walk and wear shoes" Patient has been diagnosed with DM with  neuropathy.  Post CVA with significant swelling both feet.[name] He presents for preventative foot care services. No changes to ROS  Podiatric Exam: Vascular: dorsalis pedis and posterior tibial pulses are not palpable due to swelling both feet.. Capillary return is immediate. Temperature gradient is WNL. Skin turgor WNL  Sensorium: Diminished  Semmes Weinstein monofilament test. Normal tactile sensation. Nail Exam: Pt has thick disfigured discolored nails with subungual debris noted bilateral entire nail hallux through fifth toenails Ulcer Exam: There is no evidence of ulcer or pre-ulcerative changes or infection. Orthopedic Exam: Muscle tone and strength are WNL. No limitations in general ROM. No crepitus or effusions noted. Foot type and digits show no abnormalities. Bony prominences are unremarkable. Skin: No Porokeratosis. No infection or ulcers  Diagnosis:  Tinea unguium, Pain in right toe, pain in left toes  Treatment & Plan Procedures and Treatment: Consent by patient was obtained for treatment procedures. The patient understood the discussion of treatment and procedures well. All questions were answered thoroughly reviewed. Debridement of mycotic and hypertrophic toenails, 1 through 5 bilateral and clearing of subungual debris. No ulceration, no infection noted. Patient had difficult time getting into his wheel chair from treatment chair. Return Visit-Office Procedure: Patient instructed to return to the office for a follow up visit 3 months for continued evaluation and treatment.   Gardiner Barefoot DPM

## 2017-04-16 ENCOUNTER — Other Ambulatory Visit: Payer: Self-pay | Admitting: Family Medicine

## 2017-05-01 ENCOUNTER — Other Ambulatory Visit: Payer: Self-pay | Admitting: "Endocrinology

## 2017-05-04 ENCOUNTER — Other Ambulatory Visit: Payer: Self-pay | Admitting: "Endocrinology

## 2017-05-04 DIAGNOSIS — E782 Mixed hyperlipidemia: Secondary | ICD-10-CM | POA: Diagnosis not present

## 2017-05-04 DIAGNOSIS — E039 Hypothyroidism, unspecified: Secondary | ICD-10-CM | POA: Diagnosis not present

## 2017-05-04 DIAGNOSIS — N182 Chronic kidney disease, stage 2 (mild): Secondary | ICD-10-CM | POA: Diagnosis not present

## 2017-05-04 DIAGNOSIS — Z794 Long term (current) use of insulin: Secondary | ICD-10-CM | POA: Diagnosis not present

## 2017-05-04 DIAGNOSIS — E1122 Type 2 diabetes mellitus with diabetic chronic kidney disease: Secondary | ICD-10-CM | POA: Diagnosis not present

## 2017-05-04 LAB — COMPREHENSIVE METABOLIC PANEL
ALBUMIN: 3.9 g/dL (ref 3.6–5.1)
ALK PHOS: 82 U/L (ref 40–115)
ALT: 10 U/L (ref 9–46)
AST: 12 U/L (ref 10–35)
BUN: 16 mg/dL (ref 7–25)
CALCIUM: 10.3 mg/dL (ref 8.6–10.3)
CO2: 23 mmol/L (ref 20–31)
Chloride: 105 mmol/L (ref 98–110)
Creat: 1.32 mg/dL — ABNORMAL HIGH (ref 0.70–1.18)
Glucose, Bld: 111 mg/dL — ABNORMAL HIGH (ref 65–99)
POTASSIUM: 4.3 mmol/L (ref 3.5–5.3)
Sodium: 139 mmol/L (ref 135–146)
TOTAL PROTEIN: 7.3 g/dL (ref 6.1–8.1)
Total Bilirubin: 0.3 mg/dL (ref 0.2–1.2)

## 2017-05-04 LAB — LIPID PANEL
CHOLESTEROL: 164 mg/dL (ref <200)
HDL: 47 mg/dL (ref >40)
LDL Cholesterol: 51 mg/dL (ref <100)
Total CHOL/HDL Ratio: 3.5 Ratio (ref <5.0)
Triglycerides: 328 mg/dL — ABNORMAL HIGH (ref <150)
VLDL: 66 mg/dL — ABNORMAL HIGH (ref <30)

## 2017-05-04 LAB — MICROALBUMIN / CREATININE URINE RATIO
Creatinine, Urine: 232 mg/dL (ref 20–370)
MICROALB/CREAT RATIO: 1001 ug/mg{creat} — AB (ref <30)
Microalb, Ur: 232.3 mg/dL

## 2017-05-04 LAB — TSH: TSH: 4.8 mIU/L — ABNORMAL HIGH (ref 0.40–4.50)

## 2017-05-04 LAB — T4, FREE: FREE T4: 1.2 ng/dL (ref 0.8–1.8)

## 2017-05-05 LAB — HEMOGLOBIN A1C
Hgb A1c MFr Bld: 7.2 % — ABNORMAL HIGH (ref <5.7)
Mean Plasma Glucose: 160 mg/dL

## 2017-05-06 ENCOUNTER — Other Ambulatory Visit: Payer: Self-pay | Admitting: Family Medicine

## 2017-05-09 NOTE — Telephone Encounter (Signed)
okay

## 2017-05-09 NOTE — Telephone Encounter (Signed)
Ok to refill 

## 2017-05-11 ENCOUNTER — Encounter: Payer: Self-pay | Admitting: "Endocrinology

## 2017-05-11 ENCOUNTER — Ambulatory Visit (INDEPENDENT_AMBULATORY_CARE_PROVIDER_SITE_OTHER): Payer: Medicare Other | Admitting: "Endocrinology

## 2017-05-11 VITALS — BP 136/89 | HR 73

## 2017-05-11 DIAGNOSIS — I10 Essential (primary) hypertension: Secondary | ICD-10-CM

## 2017-05-11 DIAGNOSIS — Z794 Long term (current) use of insulin: Secondary | ICD-10-CM

## 2017-05-11 DIAGNOSIS — N182 Chronic kidney disease, stage 2 (mild): Secondary | ICD-10-CM

## 2017-05-11 DIAGNOSIS — E039 Hypothyroidism, unspecified: Secondary | ICD-10-CM | POA: Diagnosis not present

## 2017-05-11 DIAGNOSIS — E1122 Type 2 diabetes mellitus with diabetic chronic kidney disease: Secondary | ICD-10-CM

## 2017-05-11 DIAGNOSIS — E782 Mixed hyperlipidemia: Secondary | ICD-10-CM

## 2017-05-11 MED ORDER — INSULIN GLARGINE 100 UNIT/ML SOLOSTAR PEN: PEN_INJECTOR | SUBCUTANEOUS | 2 refills | Status: DC

## 2017-05-11 MED ORDER — LEVOTHYROXINE SODIUM 88 MCG PO TABS: 88.0000 ug | ORAL_TABLET | Freq: Every day | ORAL | 3 refills | Status: DC

## 2017-05-11 NOTE — Progress Notes (Signed)
Subjective:    Patient ID: [name], male    DOB: [birth year],    Past Medical History:  Diagnosis Date  . Arthritis   . Asthma    "hx of"  . Barrett's esophagus    EGD 03/23/2011 & EGD 2/09 bx proven  . BPH (benign prostatic hyperplasia)   . Cancer of parotid gland ([place]) 11/23/12   Adenocarcinoma  . Chronic abdominal pain   . Colon polyp 03/23/2011   tubular adenoma, Dr. Gala Romney  . 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 ([place]) 07/15/2011  . CVA (cerebral infarction) 1998   right sided deficit  . Diverticulosis    TCS 03/23/11 pancolonic diverticula &TCS 5/08, pancolonic diverticula  . DM (diabetes mellitus) ([place])   . Edema of lower extremity 12/21/12   bilateral   . Esotropia of left eye   . GERD (gastroesophageal reflux disease)   . Glaucoma (increased eye pressure)   . Gout   . Gout   . Hemorrhagic colitis 06/06/2012.  [name] Hemorrhoids, internal 03/23/2011   tcs by Dr. Gala Romney  . Hepatitis    esosiniphilic, tx with prednisone  . Hiatal hernia   . HTN (hypertension)   . Hx of radiation therapy 1974   right base of skull area-lymphoma  . Hyperlipidemia   . Lower facial weakness    Right  . Lymphoma ([place]) 1974   XRT at [facility], right base of skull area  . Neuropathy   . Renal insufficiency   . Steatohepatitis    liver biopsy 2009  . Stroke [facility]) 1998   right hemiparesis/plegia   Past Surgical History:  Procedure Laterality Date  . BIOPSY  12/01/2016   Procedure: BIOPSY;  Surgeon: Daneil Dolin, MD;  Location: [facility];  Service: Endoscopy;;  duodenum, gastric, esophagus  . CHOLECYSTECTOMY    . COLONOSCOPY  03/23/11   Dr. Gala Romney  pancolonic diverticula, hemorrhoids, tubular adenoma.. next tcs 03/2016  . COLONOSCOPY WITH PROPOFOL N/A 12/01/2016   inadequate bowel prep precluded exam  . ESOPHAGOGASTRODUODENOSCOPY  02/05/08   goblet cell metaplasia/negative for H.pylori  . ESOPHAGOGASTRODUODENOSCOPY  03/23/11   Dr. Gala Romney, barretts, hiatal hernia  .  ESOPHAGOGASTRODUODENOSCOPY (EGD) WITH PROPOFOL N/A 12/01/2016   Dr. Gala Romney: Large amount of retained gastric contents precluded completion of the stomach. Mucosal changes were found in the stomach. Erosions and somewhat scalloped appearing mucosa present, reactive gastritis/no H pylori. Barrett's esophagus noted, no dysplasia on biopsy. Duodenal biopsies taken as well, benign, no evidence of eosinophilia.  [name] MASS BIOPSY  11/01/2012   Procedure: NECK MASS BIOPSY;  Surgeon: Ascencion Dike, MD;  Location: [facility] ORS;  Service: ENT;  Laterality: Right;  Excisional Bx Right Neck Mass; attempted external jugular cutdown of left side  . PAROTIDECTOMY  11/24/2012   Procedure: PAROTIDECTOMY;  Surgeon: Ascencion Dike, MD;  Location: [facility];  Service: ENT;  Laterality: N/A;  Total parotidectomy  . PLEURECTOMY    . right lymph node removal Right    behind right ear  . Right video-assisted thoracic surgery, pleurectomy, and pleurodesis  2011   Social History   Social History  . Marital status: Married    Spouse name: N/A  . Number of children: 1  . Years of education: N/A   Occupational History  . disabled    Social History Main Topics  . Smoking status: Former Smoker    Packs/day: 3.00    Years: 25.00    Types: Cigarettes  Quit date: 03/01/1997  . Smokeless tobacco: Never Used  . Alcohol use No  . Drug use: No  . Sexual activity: No   Other Topics Concern  . None   Social History Narrative  . None   Outpatient Encounter Prescriptions as of 05/11/2017  Medication Sig  . allopurinol (ZYLOPRIM) 300 MG tablet TAKE 1 TABLET BY MOUTH EVERY DAY  . amLODipine (NORVASC) 10 MG tablet TAKE 1 TABLET BY MOUTH DAILY  . cloNIDine (CATAPRES) 0.2 MG tablet TAKE 1 TABLET BY MOUTH THREE TIMES DAILY  . clotrimazole-betamethasone (LOTRISONE) cream APPLY EXTERNALLY TO THE AFFECTED AREA TWICE DAILY  . furosemide (LASIX) 40 MG tablet TAKE 1 TABLET BY MOUTH DAILY  . gabapentin (NEURONTIN) 300 MG capsule TAKE 2  CAPSULES(600 MG) BY MOUTH THREE TIMES DAILY  . Insulin Glargine (LANTUS SOLOSTAR) 100 UNIT/ML Solostar Pen Inject 50 Units into the skin at bedtime.  . Insulin Glargine (LANTUS SOLOSTAR) 100 UNIT/ML Solostar Pen INJECT 55 UNITS SUBCUTANEOUSLY EVERY NIGHT AT BEDTIME  . levothyroxine (SYNTHROID, LEVOTHROID) 88 MCG tablet Take 1 tablet (88 mcg total) by mouth daily.  [name] linagliptin (TRADJENTA) 5 MG TABS tablet Take 1 tablet by mouth  daily  . lisinopril (PRINIVIL,ZESTRIL) 20 MG tablet TAKE 1 TABLET BY MOUTH DAILY  . lubiprostone (AMITIZA) 24 MCG capsule Take 1 capsule (24 mcg total) by mouth 2 (two) times daily with a meal.  . metFORMIN (GLUCOPHAGE) 500 MG tablet TAKE 1 TABLET BY MOUTH TWICE DAILY  . metoprolol succinate (TOPROL-XL) 50 MG 24 hr tablet TAKE 1 TABLET BY MOUTH DAILY WITH OR IMMEDIATELY FOLLOWING A MEAL  . naloxegol oxalate (MOVANTIK) 25 MG TABS tablet Take 1 tablet (25 mg total) by mouth daily.  [name] DELICA LANCETS FINE MISC USE FOUR TIMES DAILY  . ONETOUCH VERIO test strip TEST FOUR TIMES DAILY  . oxyCODONE-acetaminophen (PERCOCET) 7.5-325 MG tablet Take 1 tablet by mouth every 4 (four) hours as needed.  . pantoprazole (PROTONIX) 40 MG tablet TAKE 1 TABLET BY MOUTH DAILY  . potassium chloride SA (K-DUR,KLOR-CON) 20 MEQ tablet TAKE 1 TABLET(20 MEQ) BY MOUTH DAILY  . potassium chloride SA (K-DUR,KLOR-CON) 20 MEQ tablet TAKE 1 TABLET(20 MEQ) BY MOUTH DAILY  . simvastatin (ZOCOR) 20 MG tablet TAKE 1 TABLET BY MOUTH EVERY NIGHT AT BEDTIME  . sulfamethoxazole-trimethoprim (BACTRIM DS,SEPTRA DS) 800-160 MG tablet Take 1 tablet by mouth 2 (two) times daily.  . tamsulosin (FLOMAX) 0.4 MG CAPS capsule TAKE 1 CAPSULE BY MOUTH DAILY  . tiZANidine (ZANAFLEX) 2 MG tablet TAKE 1 TABLET(2 MG) BY MOUTH TWICE DAILY AS NEEDED  . [DISCONTINUED] insulin lispro (HUMALOG KWIKPEN) 100 UNIT/ML KiwkPen Inject 0.05-0.11 mLs (5-11 Units total) into the skin 3 (three) times daily with meals. (Patient taking  differently: Inject 5-11 Units into the skin 3 (three) times daily with meals. Sliding scale  90-149=5 units, 150-199=6 units, 200-249=7 units, 250-299=8 units, 300-349=9 units,  350-349=10 units, & greater than 350=11 units)  . [DISCONTINUED] LANTUS SOLOSTAR 100 UNIT/ML Solostar Pen INJECT 50 UNITS SUBCUTANEOUSLY EVERY NIGHT AT BEDTIME  . [DISCONTINUED] levothyroxine (SYNTHROID, LEVOTHROID) 75 MCG tablet Take 1 tablet (75 mcg total) by mouth daily.   No facility-administered encounter medications on file as of 05/11/2017.    ALLERGIES: No Known Allergies VACCINATION STATUS: Immunization History  Administered Date(s) Administered  . Influenza Split 08/24/2012  . Influenza,inj,Quad PF,36+ Mos 10/09/2013, 09/05/2014, 11/23/2015, 08/16/2016  . Pneumococcal Conjugate-13 01/07/2014  . Pneumococcal Polysaccharide-23 11/25/2012    Diabetes  He presents for his follow-up diabetic  visit. He has type 2 diabetes mellitus. Onset time: diagnosed approx at age 46. His disease course has been stable. There are no hypoglycemic associated symptoms. Pertinent negatives for hypoglycemia include no confusion, headaches, pallor or seizures. Associated symptoms include weakness. Pertinent negatives for diabetes include no chest pain, no fatigue, no polydipsia, no polyphagia and no polyuria. Symptoms are stable. Diabetic complications include a CVA. He never participates in exercise. His breakfast blood glucose range is generally 140-180 mg/dl. His overall blood glucose range is 140-180 mg/dl. An ACE inhibitor/angiotensin II receptor blocker is being taken.  Hyperlipidemia  Pertinent negatives include no chest pain, myalgias or shortness of breath.  Hypertension  Pertinent negatives include no chest pain, headaches, neck pain, palpitations or shortness of breath. Hypertensive end-organ damage includes CVA.     Review of Systems  Constitutional: Negative for chills, fatigue, fever and unexpected weight change.   HENT: Negative for dental problem, mouth sores and trouble swallowing.   Eyes: Negative for visual disturbance.  Respiratory: Negative for cough, choking, chest tightness, shortness of breath and wheezing.   Cardiovascular: Negative for chest pain, palpitations and leg swelling.  Gastrointestinal: Negative for abdominal distention, abdominal pain, constipation, diarrhea, nausea and vomiting.  Endocrine: Negative for polydipsia, polyphagia and polyuria.  Genitourinary: Negative for dysuria, flank pain, hematuria and urgency.  Musculoskeletal: Negative for back pain, gait problem, joint swelling, myalgias and neck pain.  Skin: Negative for pallor, rash and wound.  Neurological: Positive for weakness. Negative for seizures, numbness and headaches.  Psychiatric/Behavioral: Negative for confusion, dysphoric mood, hallucinations and suicidal ideas.    Objective:    BP 136/89   Pulse 73   Wt Readings from Last 3 Encounters:  12/16/16 275 lb (124.7 kg)  12/01/16 275 lb (124.7 kg)  11/25/16 260 lb (117.9 kg)    Physical Exam  Constitutional: He is oriented to person, place, and time. He appears well-developed and well-nourished. He is cooperative.  HENT:  Head: Normocephalic and atraumatic.  Eyes: EOM are normal.  Neck: Normal range of motion. Neck supple. No tracheal deviation present. No thyromegaly present.  Cardiovascular: Normal rate and normal heart sounds.  Exam reveals no gallop.   No murmur heard. Pulses:      Dorsalis pedis pulses are 1+ on the right side.       Posterior tibial pulses are 1+ on the right side.  Pulmonary/Chest: Breath sounds normal. No respiratory distress. He has no wheezes.  Abdominal: Soft. Bowel sounds are normal. He exhibits no distension.  Musculoskeletal: He exhibits no edema.  Neurological: He is alert and oriented to person, place, and time. A sensory deficit is present. No cranial nerve deficit. He exhibits normal muscle tone. Gait normal.  Pt is  wheelchair bound due to past recurrent CVA  Skin: Skin is warm and dry. No rash noted. No cyanosis. Nails show no clubbing.  Psychiatric: He has a normal mood and affect. His speech is normal and behavior is normal. Judgment and thought content normal. Cognition and memory are normal.    CMP  CMP Latest Ref Rng & Units 05/04/2017 12/27/2016 11/25/2016  Glucose 65 - 99 mg/dL 111(H) 116(H) 131(H)  BUN 7 - 25 mg/dL 16 20 22(H)  Creatinine 0.70 - 1.18 mg/dL 1.32(H) 1.66(H) 1.60(H)  Sodium 135 - 146 mmol/L 139 138 135  Potassium 3.5 - 5.3 mmol/L 4.3 6.1(H) 4.9  Chloride 98 - 110 mmol/L 105 105 105  CO2 20 - 31 mmol/L 23 26 19(L)  Calcium 8.6 - 10.3 mg/dL  10.3 11.1(H) 10.6(H)  Total Protein 6.1 - 8.1 g/dL 7.3 7.4 -  Total Bilirubin 0.2 - 1.2 mg/dL 0.3 0.3 -  Alkaline Phos 40 - 115 U/L 82 74 -  AST 10 - 35 U/L 12 12 -  ALT 9 - 46 U/L 10 11 -    Diabetic Labs (most recent): Lab Results  Component Value Date   HGBA1C 7.2 (H) 05/04/2017   HGBA1C 6.9 (H) 12/27/2016   HGBA1C 6.8 (H) 08/09/2016   Lipid Panel     Component Value Date/Time   CHOL 164 05/04/2017 0931   TRIG 328 (H) 05/04/2017 0931   HDL 47 05/04/2017 0931   CHOLHDL 3.5 05/04/2017 0931   VLDL 66 (H) 05/04/2017 0931   LDLCALC 51 05/04/2017 0931     Assessment & Plan:   1. Type 2 diabetes mellitus with stage 2 chronic kidney disease and CVA - patient remains at a high risk for more acute and chronic complications of diabetes which include CAD, CVA, CKD, retinopathy, and neuropathy. These are all discussed in detail with the patient.  Patient came with controlled glucose profile, and  recent A1c of 7.2 %.  Glucose logs and insulin administration records pertaining to this visit,  to be scanned into patient's records.  Recent labs reviewed.   - I have re-counseled the patient on diet management and weight loss  by adopting a carbohydrate restricted / protein rich  Diet.  - Suggestion is made for patient to avoid simple  carbohydrates   from his diet including Cakes , Desserts, Ice Cream,  Soda (  diet and regular) , Sweet Tea , Candies,  Chips, Cookies, Artificial Sweeteners,   and "Sugar-free" Products .  This will help patient to have stable blood glucose profile and potentially avoid unintended  Weight gain.  - He admits to dietary indiscretion specifically consumes large quantity of potato chips. - Patient is advised to stick to a routine mealtimes to eat 3 meals  a day and avoid unnecessary snacks ( to snack only to correct hypoglycemia).  - I have approached patient with the following individualized plan to manage diabetes and patient agrees.  - I will  attempt to simplify his treatment. I advised them to increase Lantus to 55 units daily at bedtime, hold Humalog for now, continue to monitor blood glucose strictly 2 times a day-before breakfast and daily at bedtime.    -Patient is encouraged to call clinic for blood glucose levels less than 70 or above 300 mg /dl.   - I will continue  Metformin 529m po BID,  Tradjenta was discontinued due to cost reasons.  -Patient is not a candidate for SGLT2 inhibitors due to CKD.  - If he maintains this control of diabetes [facility]  - Patient specific target  for A1c; LDL, HDL, Triglycerides, and  Waist Circumference were discussed in detail.  2) BP/HTN: Controlled. Continue current medications including ACEI/ARB. 3) Lipids/HPL:  continue statins. 4)  Weight/Diet: He is limited on how much she can exercise and carbohydrates information provided.  5) hypothyroidism:  - Based on his latest thyroid function tests, he would benefit from slight increase in his levothyroxine. I will prescribe levothyroxine 88 g by mouth every morning.   - We discussed about correct intake of levothyroxine, at fasting, with water, separated by at least 30 minutes from breakfast, and separated by more than 4 hours from calcium, iron, multivitamins, acid  reflux medications (PPIs). -Patient is made aware  of the fact that thyroid hormone replacement is needed for life, dose to be adjusted by periodic monitoring of thyroid function tests.  6) Chronic Care/Health Maintenance:  -Patient is on ACEI/ARB and Statin medications and encouraged to continue to follow up with Ophthalmology, Podiatrist at least yearly or according to recommendations, and advised to  stay away from smoking. I have recommended yearly flu vaccine and pneumonia vaccination at least every 5 years; moderate intensity exercise for up to 150 minutes weekly; and  sleep for at least 7 hours a day.  - 25 minutes of time was spent on the care of this patient , 50% of which was applied for counseling on diabetes complications and their preventions.  - I advised patient to maintain close follow up with Dr. Buelah Manis for primary care needs.  Patient is asked to bring meter and  blood glucose logs during his next visit.   Follow up plan: Return in about 3 months (around 08/11/2017) for follow up with pre-visit labs, meter, and logs.  Glade Lloyd, MD Phone: [phone]  Fax: [phone]   05/11/2017, 2:29 PM

## 2017-05-22 ENCOUNTER — Other Ambulatory Visit: Payer: Self-pay | Admitting: Family Medicine

## 2017-06-01 ENCOUNTER — Other Ambulatory Visit: Payer: Self-pay | Admitting: Family Medicine

## 2017-06-01 NOTE — Telephone Encounter (Signed)
Patient is calling to get rx for his oxycodone

## 2017-06-01 NOTE — Telephone Encounter (Signed)
Ok to refill??  Last office visit/ refill 12/20/2016.

## 2017-06-02 MED ORDER — OXYCODONE-ACETAMINOPHEN 7.5-325 MG PO TABS: 1.0000 | ORAL_TABLET | ORAL | 0 refills | Status: DC | PRN

## 2017-06-02 NOTE — Telephone Encounter (Signed)
okay

## 2017-06-02 NOTE — Telephone Encounter (Signed)
Pt can pick  Rx up after 2pm today

## 2017-06-09 ENCOUNTER — Other Ambulatory Visit: Payer: Self-pay | Admitting: Family Medicine

## 2017-06-12 ENCOUNTER — Other Ambulatory Visit: Payer: Self-pay

## 2017-06-18 ENCOUNTER — Other Ambulatory Visit: Payer: Self-pay | Admitting: Family Medicine

## 2017-06-20 ENCOUNTER — Other Ambulatory Visit: Payer: Self-pay | Admitting: Family Medicine

## 2017-06-21 ENCOUNTER — Encounter: Payer: Medicare Other | Admitting: Family Medicine

## 2017-06-24 ENCOUNTER — Other Ambulatory Visit: Payer: Self-pay | Admitting: Family Medicine

## 2017-06-26 ENCOUNTER — Ambulatory Visit (INDEPENDENT_AMBULATORY_CARE_PROVIDER_SITE_OTHER): Payer: Medicare Other | Admitting: Family Medicine

## 2017-06-26 ENCOUNTER — Encounter: Payer: Self-pay | Admitting: Family Medicine

## 2017-06-26 VITALS — BP 134/68 | HR 72 | Temp 98.6°F | Resp 18

## 2017-06-26 DIAGNOSIS — R109 Unspecified abdominal pain: Secondary | ICD-10-CM

## 2017-06-26 DIAGNOSIS — E782 Mixed hyperlipidemia: Secondary | ICD-10-CM

## 2017-06-26 DIAGNOSIS — L0293 Carbuncle, unspecified: Secondary | ICD-10-CM | POA: Diagnosis not present

## 2017-06-26 DIAGNOSIS — I69959 Hemiplegia and hemiparesis following unspecified cerebrovascular disease affecting unspecified side: Secondary | ICD-10-CM

## 2017-06-26 DIAGNOSIS — Z0000 Encounter for general adult medical examination without abnormal findings: Secondary | ICD-10-CM | POA: Diagnosis not present

## 2017-06-26 DIAGNOSIS — E1122 Type 2 diabetes mellitus with diabetic chronic kidney disease: Secondary | ICD-10-CM | POA: Diagnosis not present

## 2017-06-26 DIAGNOSIS — Z794 Long term (current) use of insulin: Secondary | ICD-10-CM

## 2017-06-26 DIAGNOSIS — G8929 Other chronic pain: Secondary | ICD-10-CM | POA: Diagnosis not present

## 2017-06-26 DIAGNOSIS — N182 Chronic kidney disease, stage 2 (mild): Secondary | ICD-10-CM

## 2017-06-26 DIAGNOSIS — Z8572 Personal history of non-Hodgkin lymphomas: Secondary | ICD-10-CM

## 2017-06-26 DIAGNOSIS — Z8579 Personal history of other malignant neoplasms of lymphoid, hematopoietic and related tissues: Secondary | ICD-10-CM

## 2017-06-26 MED ORDER — SULFAMETHOXAZOLE-TRIMETHOPRIM 800-160 MG PO TABS: 1.0000 | ORAL_TABLET | Freq: Two times a day (BID) | ORAL | 0 refills | Status: DC

## 2017-06-26 MED ORDER — OXYCODONE-ACETAMINOPHEN 7.5-325 MG PO TABS: 1.0000 | ORAL_TABLET | ORAL | 0 refills | Status: DC | PRN
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
## 2017-06-26 NOTE — Telephone Encounter (Signed)
Refill appropriate and filled per protocol. 

## 2017-06-26 NOTE — Patient Instructions (Addendum)
We will reschedule Infection disease appointment  Wear the compression hose, elevate feet We will call about Lasix Antibiotics sent to pharmacy Review the advanced directives  F/U Nov

## 2017-06-27 ENCOUNTER — Other Ambulatory Visit: Payer: Self-pay | Admitting: Family Medicine

## 2017-06-27 ENCOUNTER — Encounter: Payer: Self-pay | Admitting: Family Medicine

## 2017-06-27 ENCOUNTER — Other Ambulatory Visit: Payer: Self-pay | Admitting: *Deleted

## 2017-06-27 MED ORDER — FUROSEMIDE 40 MG PO TABS: 40.0000 mg | ORAL_TABLET | Freq: Every day | ORAL | 3 refills | Status: DC

## 2017-06-27 NOTE — Telephone Encounter (Signed)
ID appointment rescheduled with Dr Michel Bickers in [place] office on 07/11/2017 @ 9am.   Call placed to patient wife [name]. [place].

## 2017-06-27 NOTE — Telephone Encounter (Signed)
-----   Message from [name], MD sent at 06/27/2017  7:56 AM EDT ----- Regarding: Lasix Please call wife and see if she has the lasix 73m at home,send refill if needed   Please reschedule there Infectious disease appointment, I [place] They had a death in the family in A05-07-24 wife's brother died, she is caregiver for him

## 2017-06-27 NOTE — Telephone Encounter (Signed)
Per patient wife, no prescription noted at home. Prescription sent to pharmacy.   Patient wife also inquired as to prescription for DM meter that she discussed with MD. Please advise.

## 2017-06-27 NOTE — Telephone Encounter (Signed)
Call her tell her it is not covered around $200 for the sensors

## 2017-06-27 NOTE — Telephone Encounter (Signed)
Patient wife returned call and made aware.

## 2017-06-28 ENCOUNTER — Other Ambulatory Visit: Payer: Self-pay | Admitting: *Deleted

## 2017-06-28 MED ORDER — SULFAMETHOXAZOLE-TRIMETHOPRIM 800-160 MG PO TABS: 1.0000 | ORAL_TABLET | Freq: Two times a day (BID) | ORAL | 0 refills | Status: DC

## 2017-06-29 ENCOUNTER — Other Ambulatory Visit: Payer: Self-pay | Admitting: "Endocrinology

## 2017-07-11 ENCOUNTER — Encounter: Payer: Self-pay | Admitting: Internal Medicine

## 2017-07-11 ENCOUNTER — Ambulatory Visit (INDEPENDENT_AMBULATORY_CARE_PROVIDER_SITE_OTHER): Payer: Medicare Other | Admitting: Internal Medicine

## 2017-07-11 DIAGNOSIS — L0293 Carbuncle, unspecified: Secondary | ICD-10-CM | POA: Diagnosis not present

## 2017-07-11 MED ORDER — MUPIROCIN 2 % EX OINT: 1.0000 "application " | TOPICAL_OINTMENT | Freq: Two times a day (BID) | CUTANEOUS | 0 refills | Status: AC

## 2017-07-11 MED ORDER — MUPIROCIN 2 % EX OINT: TOPICAL_OINTMENT | Freq: Two times a day (BID) | CUTANEOUS | Status: DC

## 2017-07-11 NOTE — Patient Instructions (Signed)
Shower with chlorhexidine soap from the neck down once weekly.  Use moisturizing cream after showers.  Apply mupirocin ointment intranasally with a Q-tip twice daily for 5 days.

## 2017-07-11 NOTE — Assessment & Plan Note (Signed)
There is very little evidence based data to guide management of patients with recurrent boils with regard to what might be done to prevent further recurrences. I suggest trying a trial of weekly chlorhexidine bathing and mupirocin intranasal ointment for decolonization. Because the chlorhexidine may cause further drying of his skin recommended liberal use of moisturizing creams after showering. If he does have recurrences that require incision and drainage I would culture again to help guide future antibiotic therapy. He can follow-up here as needed.

## 2017-07-11 NOTE — Progress Notes (Signed)
[facility] for Infectious Disease  Reason for Consult: Recurrent boils Referring Physician: Dr. Vic Blackbird  Assessment: There is very little evidence based data to guide management of patients with recurrent boils with regard to what might be done to prevent further recurrences. I suggest trying a trial of weekly chlorhexidine bathing and mupirocin intranasal ointment for decolonization. Because the chlorhexidine may cause further drying of his skin recommended liberal use of moisturizing creams after showering. If he does have recurrences that require incision and drainage I would culture again to help guide future antibiotic therapy. He can follow-up here as needed.   Plan: 1. Shower with chlorhexidine soap weekly 2. Liberal use of moisturizing cream 3. Intranasal mupirocin ointment twice daily for 5 days   Patient Active Problem List   Diagnosis Date Noted  . Recurrent boils 01/12/2012    Priority: High  . RUQ pain 11/09/2016  . Colon adenomas 11/09/2016  . Primary hypothyroidism 09/09/2015  . Chronic constipation 05/22/2015  . Candidiasis, intertrigo 09/05/2014  . Arthritis, shoulder region 12/10/2013  . Impetigo bullosa 10/23/2013  . Intertrigo 10/12/2013  . Chronic left shoulder pain 09/25/2013  . Muscle weakness (generalized) 09/25/2013  . Hypercalcemia 08/08/2013  . CKD (chronic kidney disease), stage III 08/07/2013  . Adhesive capsulitis of left shoulder 05/14/2013  . Steatohepatitis   . Diverticulosis   . GERD (gastroesophageal reflux disease)   . History of CVA (cerebrovascular accident)   . Hiatal hernia   . Mixed hyperlipidemia   . Glaucoma (increased eye pressure)   . Esotropia of left eye   . BPH (benign prostatic hyperplasia)   . Hx of radiation therapy   . 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 Sebaceous cyst 10/05/2012  . Neck pain 08/26/2012  . Chronic abdominal pain 08/26/2012  . Leg edema 08/24/2012  . Hemiplegia of  dominant side as late effect following cerebrovascular disease ([place]) 06/06/2012  . Diarrhea 04/17/2012  . Obesity 04/17/2012  . Diabetic neuropathy ([place]) 02/13/2012  . 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 ([place]) 07/15/2011  . Hemorrhoids, internal 03/23/2011  . Hepatitis 03/02/2011  . EOSINOPHILIA 12/23/2009  . Type 2 diabetes mellitus with stage 2 chronic kidney disease ([place]) 01/05/2009  . Gout, unspecified 01/05/2009  . Essential hypertension 01/05/2009  . BARRETTS ESOPHAGUS 01/05/2009  . DIVERTICULOSIS OF COLON 01/05/2009  . History of lymphoma 01/05/2009    Patient's Medications  New Prescriptions   MUPIROCIN OINTMENT (BACTROBAN) 2 %    Place 1 application into the nose 2 (two) times daily.  Previous Medications   ALLOPURINOL (ZYLOPRIM) 300 MG TABLET    TAKE 1 TABLET BY MOUTH EVERY DAY   AMLODIPINE (NORVASC) 10 MG TABLET    TAKE 1 TABLET BY MOUTH DAILY   CLONIDINE (CATAPRES) 0.2 MG TABLET    TAKE 1 TABLET BY MOUTH THREE TIMES DAILY   CLOTRIMAZOLE-BETAMETHASONE (LOTRISONE) CREAM    APPLY EXTERNALLY TO THE AFFECTED AREA TWICE DAILY   FUROSEMIDE (LASIX) 40 MG TABLET    Take 1 tablet (40 mg total) by mouth daily.   FUROSEMIDE (LASIX) 40 MG TABLET    TAKE 1 TABLET BY MOUTH DAILY   GABAPENTIN (NEURONTIN) 300 MG CAPSULE    TAKE 2 CAPSULES(600 MG) BY MOUTH THREE TIMES DAILY   INSULIN GLARGINE (LANTUS SOLOSTAR) 100 UNIT/ML SOLOSTAR PEN    Inject 50 Units into the skin at bedtime.   LEVOTHYROXINE (SYNTHROID, LEVOTHROID) 88 MCG TABLET    Take 1 tablet (  88 mcg total) by mouth daily.   LISINOPRIL (PRINIVIL,ZESTRIL) 20 MG TABLET    TAKE 1 TABLET BY MOUTH DAILY   LUBIPROSTONE (AMITIZA) 24 MCG CAPSULE    Take 1 capsule (24 mcg total) by mouth 2 (two) times daily with a meal.   METFORMIN (GLUCOPHAGE) 500 MG TABLET    TAKE 1 TABLET BY MOUTH TWICE DAILY   METOPROLOL SUCCINATE (TOPROL-XL) 50 MG 24 HR TABLET    TAKE 1 TABLET BY MOUTH DAILY WITH OR IMMEDIATELY FOLLOWING A MEAL   ONETOUCH  DELICA LANCETS FINE MISC    USE FOUR TIMES DAILY   ONETOUCH VERIO TEST STRIP    TEST FOUR TIMES DAILY   OXYCODONE-ACETAMINOPHEN (PERCOCET) 7.5-325 MG TABLET    Take 1 tablet by mouth every 4 (four) hours as needed.   PANTOPRAZOLE (PROTONIX) 40 MG TABLET    TAKE 1 TABLET BY MOUTH DAILY   POTASSIUM CHLORIDE SA (K-DUR,KLOR-CON) 20 MEQ TABLET    TAKE 1 TABLET(20 MEQ) BY MOUTH DAILY   POTASSIUM CHLORIDE SA (K-DUR,KLOR-CON) 20 MEQ TABLET    TAKE 1 TABLET(20 MEQ) BY MOUTH DAILY   SIMVASTATIN (ZOCOR) 20 MG TABLET    TAKE 1 TABLET BY MOUTH EVERY NIGHT AT BEDTIME   TAMSULOSIN (FLOMAX) 0.4 MG CAPS CAPSULE    TAKE 1 CAPSULE BY MOUTH DAILY   TIZANIDINE (ZANAFLEX) 2 MG TABLET    TAKE 1 TABLET(2 MG) BY MOUTH TWICE DAILY AS NEEDED  Modified Medications   No medications on file  Discontinued Medications   SULFAMETHOXAZOLE-TRIMETHOPRIM (BACTRIM DS,SEPTRA DS) 800-160 MG TABLET    Take 1 tablet by mouth 2 (two) times daily.    HPI: [name] is a 71 y.o. male with a long history of recurrent boils. He and his wife have a difficult time telling me with this first began. It seems like it happens several times each year. Most often, the boils or on his back but occasionally in his groin. His wife believes he's had them lanced 3-4 times. A culture was obtained in January. No organisms were seen on Gram stain and the culture was negative. He has been treated with multiple antibiotics. Trimethoprim sulfamethoxazole as listed on his medication list. They are not sure what other antibiotics he may have been on. They use some special type of bar soap but do not know the name of it. He had a previous stroke and has right hemiplegia. He spends most of his day in a wheelchair or recliner.  Review of Systems: Review of Systems  Constitutional: Negative for chills, diaphoresis, fever and weight loss.  Skin:       As noted in history of present illness.  Neurological:       As noted in history of present illness.       Past Medical History:  Diagnosis Date  . Arthritis   . Asthma    "hx of"  . Barrett's esophagus    EGD 03/23/2011 & EGD 2/09 bx proven  . BPH (benign prostatic hyperplasia)   . Cancer of parotid gland ([place]) 11/23/12   Adenocarcinoma  . Chronic abdominal pain   . Colon polyp 03/23/2011   tubular adenoma, Dr. Gala Romney  . 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 ([place]) 07/15/2011  . CVA (cerebral infarction) 1998   right sided deficit  . Diverticulosis    TCS 03/23/11 pancolonic diverticula &TCS 5/08, pancolonic diverticula  . DM (diabetes mellitus) ([place])   . Edema of lower extremity 12/21/12  bilateral   . Esotropia of left eye   . GERD (gastroesophageal reflux disease)   . Glaucoma (increased eye pressure)   . Gout   . Gout   . Hemorrhagic colitis 06/06/2012.  [name] Hemorrhoids, internal 03/23/2011   tcs by Dr. Gala Romney  . Hepatitis    esosiniphilic, tx with prednisone  . Hiatal hernia   . HTN (hypertension)   . Hx of radiation therapy 1974   right base of skull area-lymphoma  . Hyperlipidemia   . Lower facial weakness    Right  . Lymphoma ([place]) 1974   XRT at [facility], right base of skull area  . Neuropathy   . Renal insufficiency   . Steatohepatitis    liver biopsy 2009  . Stroke [facility]) 1998   right hemiparesis/plegia    Social History  Substance Use Topics  . Smoking status: Former Smoker    Packs/day: 3.00    Years: 25.00    Types: Cigarettes    Quit date: 03/01/1997  . Smokeless tobacco: Never Used  . Alcohol use No    Family History  Problem Relation Age of Onset  . Heart failure Mother   . Heart failure Father   . Heart failure Sister   . Heart failure Son    No Known Allergies  OBJECTIVE: Vitals:   07/11/17 0857  BP: 99/64  Pulse: 73  Temp: 98.4 F (36.9 C)  TempSrc: Oral  Weight: 275 lb (124.7 kg)  Height: 6' (1.829 m)   Body mass index is 37.3 kg/m.   Physical Exam  Constitutional:  He is seated in a wheelchair. He is accompanied  by his wife.  Neurological: He is alert.  He has right sided weakness. He has left eyelid drooping.  Skin:  He has multiple hyperpigmented scars on his back from previous boils. He has no active boils at this time.    Microbiology: No results found for this or any previous visit (from the past 240 hour(s)).  Michel Bickers, MD [facility] for [facility] [phone] pager   [phone] cell 07/11/2017, 9:36 AM

## 2017-07-12 ENCOUNTER — Ambulatory Visit (INDEPENDENT_AMBULATORY_CARE_PROVIDER_SITE_OTHER): Payer: Medicare Other | Admitting: Podiatry

## 2017-07-12 DIAGNOSIS — M79676 Pain in unspecified toe(s): Secondary | ICD-10-CM | POA: Diagnosis not present

## 2017-07-12 DIAGNOSIS — B351 Tinea unguium: Secondary | ICD-10-CM

## 2017-07-12 DIAGNOSIS — E1149 Type 2 diabetes mellitus with other diabetic neurological complication: Secondary | ICD-10-CM | POA: Diagnosis not present

## 2017-07-12 DIAGNOSIS — R609 Edema, unspecified: Secondary | ICD-10-CM

## 2017-07-12 NOTE — Progress Notes (Signed)
Patient ID: [name], male   DOB: November 17, 1946, 70 y.o.   MRN: [id number]  Complaint:  Visit Type: Patient returns to my office for continued preventative foot care services. Complaint: Patient states" my nails have grown long and thick and become painful to walk and wear shoes" Patient has been diagnosed with DM with  neuropathy.  Post CVA with significant swelling both feet.[name] He presents for preventative foot care services. No changes to ROS  Podiatric Exam: Vascular: dorsalis pedis and posterior tibial pulses are not palpable due to swelling both feet.. Capillary return is immediate. Temperature gradient is WNL. Skin turgor WNL  Sensorium: Diminished  Semmes Weinstein monofilament test. Normal tactile sensation. Nail Exam: Pt has thick disfigured discolored nails with subungual debris noted bilateral entire nail hallux through fifth toenails Ulcer Exam: There is no evidence of ulcer or pre-ulcerative changes or infection. Orthopedic Exam: Muscle tone and strength are WNL. No limitations in general ROM. No crepitus or effusions noted. Foot type and digits show no abnormalities. Bony prominences are unremarkable. Skin: No Porokeratosis. No infection or ulcers  Diagnosis:  Tinea unguium, Pain in right toe, pain in left toes  Treatment & Plan Procedures and Treatment: Consent by patient was obtained for treatment procedures. The patient understood the discussion of treatment and procedures well. All questions were answered thoroughly reviewed. Debridement of mycotic and hypertrophic toenails, 1 through 5 bilateral and clearing of subungual debris. No ulceration, no infection noted.  Return Visit-Office Procedure: Patient instructed to return to the office for a follow up visit 3 months for continued evaluation and treatment.   Gardiner Barefoot DPM

## 2017-07-18 ENCOUNTER — Other Ambulatory Visit: Payer: Self-pay | Admitting: Family Medicine

## 2017-07-21 ENCOUNTER — Other Ambulatory Visit: Payer: Self-pay | Admitting: Family Medicine

## 2017-07-29 ENCOUNTER — Other Ambulatory Visit: Payer: Self-pay | Admitting: "Endocrinology

## 2017-08-11 ENCOUNTER — Other Ambulatory Visit: Payer: Self-pay | Admitting: Family Medicine

## 2017-08-12 ENCOUNTER — Other Ambulatory Visit: Payer: Self-pay | Admitting: "Endocrinology

## 2017-08-12 DIAGNOSIS — E1122 Type 2 diabetes mellitus with diabetic chronic kidney disease: Secondary | ICD-10-CM | POA: Diagnosis not present

## 2017-08-12 DIAGNOSIS — Z794 Long term (current) use of insulin: Secondary | ICD-10-CM | POA: Diagnosis not present

## 2017-08-12 DIAGNOSIS — E039 Hypothyroidism, unspecified: Secondary | ICD-10-CM | POA: Diagnosis not present

## 2017-08-12 DIAGNOSIS — N182 Chronic kidney disease, stage 2 (mild): Secondary | ICD-10-CM | POA: Diagnosis not present

## 2017-08-13 LAB — RENAL FUNCTION PANEL
Albumin: 4.1 g/dL (ref 3.6–5.1)
BUN: 19 mg/dL (ref 7–25)
CALCIUM: 10.7 mg/dL — AB (ref 8.6–10.3)
CHLORIDE: 105 mmol/L (ref 98–110)
CO2: 21 mmol/L (ref 20–32)
Creat: 1.31 mg/dL — ABNORMAL HIGH (ref 0.70–1.18)
GLUCOSE: 92 mg/dL (ref 65–99)
PHOSPHORUS: 2.9 mg/dL (ref 2.1–4.3)
POTASSIUM: 4.7 mmol/L (ref 3.5–5.3)
Sodium: 140 mmol/L (ref 135–146)

## 2017-08-13 LAB — TSH: TSH: 0.88 mIU/L (ref 0.40–4.50)

## 2017-08-13 LAB — T4, FREE: Free T4: 1.4 ng/dL (ref 0.8–1.8)

## 2017-08-15 ENCOUNTER — Ambulatory Visit (INDEPENDENT_AMBULATORY_CARE_PROVIDER_SITE_OTHER): Payer: Medicare Other | Admitting: "Endocrinology

## 2017-08-15 ENCOUNTER — Encounter: Payer: Self-pay | Admitting: "Endocrinology

## 2017-08-15 VITALS — BP 134/81 | HR 72 | Ht 72.0 in

## 2017-08-15 DIAGNOSIS — Z794 Long term (current) use of insulin: Secondary | ICD-10-CM

## 2017-08-15 DIAGNOSIS — I10 Essential (primary) hypertension: Secondary | ICD-10-CM | POA: Diagnosis not present

## 2017-08-15 DIAGNOSIS — E039 Hypothyroidism, unspecified: Secondary | ICD-10-CM | POA: Diagnosis not present

## 2017-08-15 DIAGNOSIS — E782 Mixed hyperlipidemia: Secondary | ICD-10-CM

## 2017-08-15 DIAGNOSIS — N182 Chronic kidney disease, stage 2 (mild): Secondary | ICD-10-CM | POA: Diagnosis not present

## 2017-08-15 DIAGNOSIS — E1122 Type 2 diabetes mellitus with diabetic chronic kidney disease: Secondary | ICD-10-CM

## 2017-08-15 LAB — HEMOGLOBIN A1C
HEMOGLOBIN A1C: 6.7 % — AB (ref <5.7)
Mean Plasma Glucose: 146 mg/dL

## 2017-08-15 NOTE — Telephone Encounter (Signed)
Refill appropriate 

## 2017-08-15 NOTE — Progress Notes (Signed)
Subjective:    Patient ID: [name], male    DOB: 08-15-1946,    Past Medical History:  Diagnosis Date  . Arthritis   . Asthma    "hx of"  . Barrett's esophagus    EGD 03/23/2011 & EGD 2/09 bx proven  . BPH (benign prostatic hyperplasia)   . Cancer of parotid gland ([place]) 11/23/12   Adenocarcinoma  . Chronic abdominal pain   . Colon polyp 03/23/2011   tubular adenoma, Dr. Gala Romney  . 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 ([place]) 07/15/2011  . CVA (cerebral infarction) 1998   right sided deficit  . Diverticulosis    TCS 03/23/11 pancolonic diverticula &TCS 5/08, pancolonic diverticula  . DM (diabetes mellitus) ([place])   . Edema of lower extremity 12/21/12   bilateral   . Esotropia of left eye   . GERD (gastroesophageal reflux disease)   . Glaucoma (increased eye pressure)   . Gout   . Gout   . Hemorrhagic colitis 06/06/2012.  [name] Hemorrhoids, internal 03/23/2011   tcs by Dr. Gala Romney  . Hepatitis    esosiniphilic, tx with prednisone  . Hiatal hernia   . HTN (hypertension)   . Hx of radiation therapy 1974   right base of skull area-lymphoma  . Hyperlipidemia   . Lower facial weakness    Right  . Lymphoma ([place]) 1974   XRT at [facility], right base of skull area  . Neuropathy   . Renal insufficiency   . Steatohepatitis    liver biopsy 2009  . Stroke [facility]) 1998   right hemiparesis/plegia   Past Surgical History:  Procedure Laterality Date  . BIOPSY  12/01/2016   Procedure: BIOPSY;  Surgeon: Daneil Dolin, MD;  Location: [facility] END[facility] SUITE;  Service: Endoscopy;;  duodenum, gastric, esophagus  . CHOLECYSTECTOMY    . COLONOSCOPY  03/23/11   Dr. Gala Romney  pancolonic diverticula, hemorrhoids, tubular adenoma.. next tcs 03/2016  . COLONOSCOPY WITH PROPOFOL N/A 12/01/2016   inadequate bowel prep precluded exam  . ESOPHAGOGASTRODUODENOSCOPY  02/05/08   goblet cell metaplasia/negative for H.pylori  . ESOPHAGOGASTRODUODENOSCOPY  03/23/11   Dr. Gala Romney, barretts, hiatal hernia  .  ESOPHAGOGASTRODUODENOSCOPY (EGD) WITH PROPOFOL N/A 12/01/2016   Dr. Gala Romney: Large amount of retained gastric contents precluded completion of the stomach. Mucosal changes were found in the stomach. Erosions and somewhat scalloped appearing mucosa present, reactive gastritis/no H pylori. Barrett's esophagus noted, no dysplasia on biopsy. Duodenal biopsies taken as well, benign, no evidence of eosinophilia.  [name] MASS BIOPSY  11/01/2012   Procedure: NECK MASS BIOPSY;  Surgeon: Ascencion Dike, MD;  Location: [facility] ORS;  Service: ENT;  Laterality: Right;  Excisional Bx Right Neck Mass; attempted external jugular cutdown of left side  . PAROTIDECTOMY  11/24/2012   Procedure: PAROTIDECTOMY;  Surgeon: Ascencion Dike, MD;  Location: [place];  Service: ENT;  Laterality: N/A;  Total parotidectomy  . PLEURECTOMY    . right lymph node removal Right    behind right ear  . Right video-assisted thoracic surgery, pleurectomy, and pleurodesis  2011   Social History   Social History  . Marital status: Married    Spouse name: N/A  . Number of children: 1  . Years of education: N/A   Occupational History  . disabled    Social History Main Topics  . Smoking status: Former Smoker    Packs/day: 3.00    Years: 25.00    Types: Cigarettes  Quit date: 03/01/1997  . Smokeless tobacco: Never Used  . Alcohol use No  . Drug use: No  . Sexual activity: No   Other Topics Concern  . None   Social History Narrative  . None   Outpatient Encounter Prescriptions as of 08/15/2017  Medication Sig  . allopurinol (ZYLOPRIM) 300 MG tablet TAKE 1 TABLET BY MOUTH EVERY DAY  . amLODipine (NORVASC) 10 MG tablet TAKE 1 TABLET BY MOUTH DAILY  . cloNIDine (CATAPRES) 0.2 MG tablet TAKE 1 TABLET BY MOUTH THREE TIMES DAILY  . clotrimazole-betamethasone (LOTRISONE) cream APPLY EXTERNALLY TO THE AFFECTED AREA TWICE DAILY  . furosemide (LASIX) 40 MG tablet Take 1 tablet (40 mg total) by mouth daily.  . furosemide (LASIX) 40 MG tablet TAKE 1  TABLET BY MOUTH DAILY  . gabapentin (NEURONTIN) 300 MG capsule TAKE 2 CAPSULES(600 MG) BY MOUTH THREE TIMES DAILY  . Insulin Glargine (LANTUS SOLOSTAR) 100 UNIT/ML Solostar Pen Inject 50 Units into the skin at bedtime. (Patient taking differently: Inject 55 Units into the skin at bedtime. )  . levothyroxine (SYNTHROID, LEVOTHROID) 88 MCG tablet Take 1 tablet (88 mcg total) by mouth daily.  [name] lisinopril (PRINIVIL,ZESTRIL) 20 MG tablet TAKE 1 TABLET BY MOUTH DAILY  . lubiprostone (AMITIZA) 24 MCG capsule Take 1 capsule (24 mcg total) by mouth 2 (two) times daily with a meal.  . metFORMIN (GLUCOPHAGE) 500 MG tablet TAKE 1 TABLET BY MOUTH TWICE DAILY  . metoprolol succinate (TOPROL-XL) 50 MG 24 hr tablet TAKE 1 TABLET BY MOUTH DAILY WITH OR IMMEDIATELY FOLLOWING A MEAL  . ONETOUCH DELICA LANCETS FINE MISC USE FOUR TIMES DAILY  . ONETOUCH VERIO test strip TEST FOUR TIMES DAILY  . oxyCODONE-acetaminophen (PERCOCET) 7.5-325 MG tablet Take 1 tablet by mouth every 4 (four) hours as needed.  . pantoprazole (PROTONIX) 40 MG tablet TAKE 1 TABLET BY MOUTH DAILY  . potassium chloride SA (K-DUR,KLOR-CON) 20 MEQ tablet TAKE 1 TABLET(20 MEQ) BY MOUTH DAILY  . potassium chloride SA (K-DUR,KLOR-CON) 20 MEQ tablet TAKE 1 TABLET(20 MEQ) BY MOUTH DAILY  . simvastatin (ZOCOR) 20 MG tablet TAKE 1 TABLET BY MOUTH EVERY NIGHT AT BEDTIME  . tamsulosin (FLOMAX) 0.4 MG CAPS capsule TAKE 1 CAPSULE BY MOUTH DAILY (Patient not taking: Reported on 07/11/2017)  . tiZANidine (ZANAFLEX) 2 MG tablet TAKE 1 TABLET(2 MG) BY MOUTH TWICE DAILY AS NEEDED   No facility-administered encounter medications on file as of 08/15/2017.    ALLERGIES: No Known Allergies VACCINATION STATUS: Immunization History  Administered Date(s) Administered  . Influenza Split 08/24/2012  . Influenza,inj,Quad PF,6+ Mos 10/09/2013, 09/05/2014, 11/23/2015, 08/16/2016  . Pneumococcal Conjugate-13 01/07/2014  . Pneumococcal Polysaccharide-23 11/25/2012     Diabetes  He presents for his follow-up diabetic visit. He has type 2 diabetes mellitus. Onset time: diagnosed approx at age 22. His disease course has been improving. There are no hypoglycemic associated symptoms. Pertinent negatives for hypoglycemia include no confusion, headaches, pallor or seizures. Associated symptoms include weakness. Pertinent negatives for diabetes include no chest pain, no fatigue, no polydipsia, no polyphagia and no polyuria. Symptoms are improving. Diabetic complications include a CVA. He never participates in exercise. His breakfast blood glucose range is generally 140-180 mg/dl. His overall blood glucose range is 140-180 mg/dl. An ACE inhibitor/angiotensin II receptor blocker is being taken.  Hyperlipidemia  Pertinent negatives include no chest pain, myalgias or shortness of breath.  Hypertension  Pertinent negatives include no chest pain, headaches, neck pain, palpitations or shortness of breath. Hypertensive end-organ damage  includes CVA.     Review of Systems  Constitutional: Negative for chills, fatigue, fever and unexpected weight change.  HENT: Negative for dental problem, mouth sores and trouble swallowing.   Eyes: Negative for visual disturbance.  Respiratory: Negative for cough, choking, chest tightness, shortness of breath and wheezing.   Cardiovascular: Negative for chest pain, palpitations and leg swelling.  Gastrointestinal: Negative for abdominal distention, abdominal pain, constipation, diarrhea, nausea and vomiting.  Endocrine: Negative for polydipsia, polyphagia and polyuria.  Genitourinary: Negative for dysuria, flank pain, hematuria and urgency.  Musculoskeletal: Negative for back pain, gait problem, joint swelling, myalgias and neck pain.  Skin: Negative for pallor, rash and wound.  Neurological: Positive for weakness. Negative for seizures, numbness and headaches.  Psychiatric/Behavioral: Negative for confusion, dysphoric mood,  hallucinations and suicidal ideas.    Objective:    BP 134/81   Pulse 72   Ht 6' (1.829 m)   Wt Readings from Last 3 Encounters:  07/11/17 275 lb (124.7 kg)  12/16/16 275 lb (124.7 kg)  12/01/16 275 lb (124.7 kg)    Physical Exam  Constitutional: He is oriented to person, place, and time. He appears well-developed and well-nourished. He is cooperative.  HENT:  Head: Normocephalic and atraumatic.  Eyes: EOM are normal.  Neck: Normal range of motion. Neck supple. No tracheal deviation present. No thyromegaly present.  Cardiovascular: Normal rate and normal heart sounds.  Exam reveals no gallop.   No murmur heard. Pulses:      Dorsalis pedis pulses are 1+ on the right side.       Posterior tibial pulses are 1+ on the right side.  Pulmonary/Chest: Breath sounds normal. No respiratory distress. He has no wheezes.  Abdominal: Soft. Bowel sounds are normal. He exhibits no distension.  Musculoskeletal: He exhibits no edema.  Neurological: He is alert and oriented to person, place, and time. A sensory deficit is present. No cranial nerve deficit. He exhibits normal muscle tone. Gait normal.  Pt is wheelchair bound due to past recurrent CVA  Skin: Skin is warm and dry. No rash noted. No cyanosis. Nails show no clubbing.  Psychiatric: He has a normal mood and affect. His speech is normal and behavior is normal. Judgment and thought content normal. Cognition and memory are normal.    CMP  CMP Latest Ref Rng & Units 08/12/2017 05/04/2017 12/27/2016  Glucose 65 - 99 mg/dL 92 111(H) 116(H)  BUN 7 - 25 mg/dL 19 16 20   Creatinine 0.70 - 1.18 mg/dL 1.31(H) 1.32(H) 1.66(H)  Sodium 135 - 146 mmol/L 140 139 138  Potassium 3.5 - 5.3 mmol/L 4.7 4.3 6.1(H)  Chloride 98 - 110 mmol/L 105 105 105  CO2 20 - 32 mmol/L 21 23 26   Calcium 8.6 - 10.3 mg/dL 10.7(H) 10.3 11.1(H)  Total Protein 6.1 - 8.1 g/dL - 7.3 7.4  Total Bilirubin 0.2 - 1.2 mg/dL - 0.3 0.3  Alkaline Phos 40 - 115 U/L - 82 74  AST 10 - 35  U/L - 12 12  ALT 9 - 46 U/L - 10 11    Diabetic Labs (most recent): Lab Results  Component Value Date   HGBA1C 6.7 (H) 08/12/2017   HGBA1C 7.2 (H) 05/04/2017   HGBA1C 6.9 (H) 12/27/2016   Lipid Panel     Component Value Date/Time   CHOL 164 05/04/2017 0931   TRIG 328 (H) 05/04/2017 0931   HDL 47 05/04/2017 0931   CHOLHDL 3.5 05/04/2017 0931   VLDL 66 (H) 05/04/2017 3810  [place] 51 05/04/2017 0931     Assessment & Plan:   1. Type 2 diabetes mellitus with stage 2 chronic kidney disease and CVA - patient remains at a high risk for more acute and chronic complications of diabetes which include CAD, CVA, CKD, retinopathy, and neuropathy. These are all discussed in detail with the patient.  Patient came with controlled glucose profile, and  recent A1c of  6.7%.  Glucose logs and insulin administration records pertaining to this visit,  to be scanned into patient's records.  Recent labs reviewed.   - I have re-counseled the patient on diet management and weight loss  by adopting a carbohydrate restricted / protein rich  Diet.  - Suggestion is made for him to avoid simple carbohydrates  from his diet including Cakes, Sweet Desserts, Ice Cream, Soda (diet and regular), Sweet Tea, Candies, Chips, Cookies, Store Bought Juices, Alcohol in Excess of  1-2 drinks a day, Artificial Sweeteners, and "Sugar-free" Products. This will help patient to have stable blood glucose profile and potentially avoid unintended weight gain.  - He admits to dietary indiscretion specifically consumes large quantity of potato chips. - Patient is advised to stick to a routine mealtimes to eat 3 meals  a day and avoid unnecessary snacks ( to snack only to correct hypoglycemia).  - I have approached patient with the following individualized plan to manage diabetes and patient agrees.  - I will   continue with Lantus  55 units daily at bedtime, continue to hold Humalog for now. - I advised for him to continue  monitoring blood glucose 2 times a day-before breakfast and daily at bedtime.  -Patient is encouraged to call clinic for blood glucose levels less than 70 or above 300 mg /dl.  - I will continue  Metformin 564m po BID,  Tradjenta was discontinued due to cost reasons.  -Patient is not a candidate for SGLT2 inhibitors due to CKD.  - If he maintains this control of diabetes [facility]  - Patient specific target  for A1c; LDL, HDL, Triglycerides, and  Waist Circumference were discussed in detail.  2) BP/HTN: Controlled. Continue current medications including ACEI/ARB. 3) Lipids/HPL:  continue statins. 4)  Weight/Diet: He is limited on how much he can exercise and carbohydrates information provided.  5) hypothyroidism:  I will continue  levothyroxine 88 g by mouth every morning.  - We discussed about correct intake of levothyroxine, at fasting, with water, separated by at least 30 minutes from breakfast, and separated by more than 4 hours from calcium, iron, multivitamins, acid reflux medications (PPIs). -Patient is made aware of the fact that thyroid hormone replacement is needed for life, dose to be adjusted by periodic monitoring of thyroid function tests. 6) Chronic Care/Health Maintenance:  -Patient is on ACEI/ARB and Statin medications and encouraged to continue to follow up with Ophthalmology, Podiatrist at least yearly or according to recommendations, and advised to  stay away from smoking. I have recommended yearly flu vaccine and pneumonia vaccination at least every 5 years; moderate intensity exercise for up to 150 minutes weekly; and  sleep for at least 7 hours a day.  -- Time spent with the patient: 25 min, of which >50% was spent in reviewing his sugar logs , discussing his hypo- and hyper-glycemic episodes, reviewing his current and  previous labs and insulin doses and developing a plan to avoid hypo- and hyper-glycemia.    - I advised patient to  maintain close follow  up with Dr. Buelah Manis for primary care needs.  Patient is asked to bring meter and  blood glucose logs during his next visit.   Follow up plan: Return in about 3 months (around 11/14/2017) for follow up with pre-visit labs, meter, and logs.  Glade Lloyd, MD Phone: [phone]  Fax: [phone]  This note was partially dictated with voice recognition software. Similar sounding words can be transcribed inadequately or may not  be corrected upon review.  08/15/2017, 3:26 PM

## 2017-08-15 NOTE — Patient Instructions (Signed)

## 2017-08-18 ENCOUNTER — Other Ambulatory Visit: Payer: Self-pay | Admitting: Family Medicine

## 2017-08-21 NOTE — Telephone Encounter (Signed)
Refill appropriate 

## 2017-08-28 ENCOUNTER — Other Ambulatory Visit: Payer: Self-pay | Admitting: "Endocrinology

## 2017-09-02 ENCOUNTER — Other Ambulatory Visit: Payer: Self-pay | Admitting: "Endocrinology

## 2017-09-03 IMAGING — NM NM GASTRIC EMPTYING
10 series · 10 of 10 positions shown · non-contrast
Comparison: CT scan from 05/23/2016

CLINICAL DATA: Right upper quadrant abdominal pain for 4 years.

EXAM:
NUCLEAR MEDICINE GASTRIC EMPTYING SCAN
TECHNIQUE: After oral ingestion of radiolabeled meal, sequential abdominal
images were obtained for 4 hours. Percentage of activity emptying
the stomach was calculated at 1 hour, 2 hour, 3 hour, and 4 hours.
RADIOPHARMACEUTICALS:  2.1 mCi Gc-VVm sulfur colloid in standardized
meal

[Series 1: 0 min · 4.14mm/px · 1 of 1 slices shown (1 of 2)]
[im 1/1]
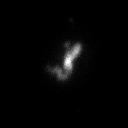

[Series 1: 0 min · 4.14mm/px · 1 of 1 slices shown (2 of 2)]
[im 1/1]
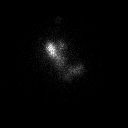

[Series 1: 4 hour · 4.14mm/px · 1 of 1 slices shown (1 of 2)]
[im 1/1]
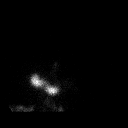

[Series 1: 4 hour · 4.14mm/px · 1 of 1 slices shown (2 of 2)]
[im 1/1  full-range]
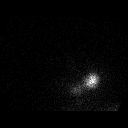

[Series 2: 60 min · 4.14mm/px · 1 of 1 slices shown (1 of 2)]
[im 1/1]
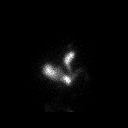

[Series 2: 60 min · 4.14mm/px · 1 of 1 slices shown (2 of 2)]
[im 1/1  full-range]
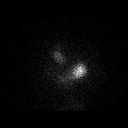

[Series 3: 120 min · 4.14mm/px · 1 of 1 slices shown (1 of 2)]
[im 1/1]
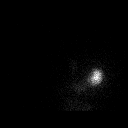

[Series 3: 120 min · 4.14mm/px · 1 of 1 slices shown (2 of 2)]
[im 1/1]
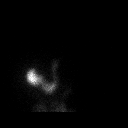

[Series 4: 180 min · 4.14mm/px · 1 of 1 slices shown (1 of 2)]
[im 1/1  full-range]
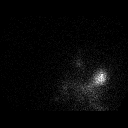

[Series 4: 180 min · 4.14mm/px · 1 of 1 slices shown (2 of 2)]
[im 1/1]
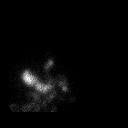

[10 of 10 positions shown; findings below may reference images not displayed]

FINDINGS: Expected location of the stomach in the left upper quadrant.
Ingested meal empties the stomach gradually over the course of the
study.

23% emptied at 1 hr ( normal >= 10%)

31% emptied at 2 hr ( normal >= 40%)

40% emptied at 3 hr ( normal >= 70%)

52% emptied at 4 hr ( normal >= 90%)
IMPRESSION: Delayed gastric emptying study.

## 2017-09-07 ENCOUNTER — Other Ambulatory Visit: Payer: Self-pay | Admitting: Family Medicine

## 2017-09-13 ENCOUNTER — Other Ambulatory Visit: Payer: Self-pay | Admitting: Family Medicine

## 2017-09-15 ENCOUNTER — Other Ambulatory Visit: Payer: Self-pay | Admitting: Family Medicine

## 2017-09-16 ENCOUNTER — Other Ambulatory Visit: Payer: Self-pay | Admitting: Family Medicine

## 2017-09-30 ENCOUNTER — Other Ambulatory Visit: Payer: Self-pay | Admitting: Family Medicine

## 2017-10-09 ENCOUNTER — Telehealth: Payer: Self-pay | Admitting: *Deleted

## 2017-10-09 MED ORDER — SULFAMETHOXAZOLE-TRIMETHOPRIM 800-160 MG PO TABS: 1.0000 | ORAL_TABLET | Freq: Two times a day (BID) | ORAL | 0 refills | Status: DC

## 2017-10-09 NOTE — Telephone Encounter (Signed)
Call placed to patient and patient made aware.   Prescription sent to pharmacy.  

## 2017-10-09 NOTE — Telephone Encounter (Signed)
Received call from patient wife, [name].   Reports that patient has (3) boil like areas to back. Requested ABTx.   MD please advise.

## 2017-10-09 NOTE — Telephone Encounter (Signed)
Bactrim DS 1 tab BID for 7 days

## 2017-10-10 DIAGNOSIS — H524 Presbyopia: Secondary | ICD-10-CM | POA: Diagnosis not present

## 2017-10-10 DIAGNOSIS — Z794 Long term (current) use of insulin: Secondary | ICD-10-CM | POA: Diagnosis not present

## 2017-10-10 DIAGNOSIS — E119 Type 2 diabetes mellitus without complications: Secondary | ICD-10-CM | POA: Diagnosis not present

## 2017-10-10 DIAGNOSIS — H5213 Myopia, bilateral: Secondary | ICD-10-CM | POA: Diagnosis not present

## 2017-10-10 DIAGNOSIS — H2512 Age-related nuclear cataract, left eye: Secondary | ICD-10-CM | POA: Diagnosis not present

## 2017-10-11 ENCOUNTER — Other Ambulatory Visit: Payer: Self-pay | Admitting: Family Medicine

## 2017-10-17 ENCOUNTER — Ambulatory Visit: Payer: Medicare Other | Admitting: Podiatry

## 2017-10-17 ENCOUNTER — Encounter: Payer: Self-pay | Admitting: Podiatry

## 2017-10-17 DIAGNOSIS — E1149 Type 2 diabetes mellitus with other diabetic neurological complication: Secondary | ICD-10-CM | POA: Diagnosis not present

## 2017-10-17 DIAGNOSIS — M79676 Pain in unspecified toe(s): Secondary | ICD-10-CM | POA: Diagnosis not present

## 2017-10-17 DIAGNOSIS — R609 Edema, unspecified: Secondary | ICD-10-CM

## 2017-10-17 DIAGNOSIS — B351 Tinea unguium: Secondary | ICD-10-CM

## 2017-10-17 NOTE — Progress Notes (Addendum)
Patient ID: [name], male   DOB: Jul 02, 1946, 71 y.o.   MRN: [id number]  Complaint:  Visit Type: Patient returns to my office for continued preventative foot care services. Complaint: Patient states" my nails have grown long and thick and become painful to walk and wear shoes" Patient has been diagnosed with DM with  neuropathy.  Post CVA with significant swelling both feet.[name] He presents for preventative foot care services. No changes to ROS  Podiatric Exam: Vascular: dorsalis pedis and posterior tibial pulses are not palpable due to swelling both feet.. Capillary return is immediate. Temperature gradient is WNL. Skin turgor WNL  Sensorium: Diminished  Semmes Weinstein monofilament test. Normal tactile sensation. Nail Exam: Pt has thick disfigured discolored nails with subungual debris noted bilateral entire nail hallux through fifth toenails Ulcer Exam: There is no evidence of ulcer or pre-ulcerative changes or infection. Orthopedic Exam: Muscle tone and strength are WNL. No limitations in general ROM. No crepitus or effusions noted. Foot type and digits show no abnormalities. Bony prominences are unremarkable. Skin: No Porokeratosis. No infection or ulcers  Diagnosis:  Tinea unguium, Pain in right toe, pain in left toes  Treatment & Plan Procedures and Treatment: Consent by patient was obtained for treatment procedures. The patient understood the discussion of treatment and procedures well. All questions were answered thoroughly reviewed. Debridement of mycotic and hypertrophic toenails, 1 through 5 bilateral and clearing of subungual debris. No ulceration, no infection noted. Patient relates having feet pain. Told him to talk to [facility] cal doctor.about treatment. Return Visit-Office Procedure: Patient instructed to return to the office for a follow up visit 3 months for continued evaluation and treatment.   Gardiner Barefoot DPM

## 2017-10-20 ENCOUNTER — Other Ambulatory Visit: Payer: Self-pay | Admitting: Family Medicine

## 2017-10-27 ENCOUNTER — Other Ambulatory Visit: Payer: Self-pay | Admitting: "Endocrinology

## 2017-10-30 ENCOUNTER — Ambulatory Visit: Payer: Medicare Other | Admitting: Family Medicine

## 2017-10-31 ENCOUNTER — Ambulatory Visit (INDEPENDENT_AMBULATORY_CARE_PROVIDER_SITE_OTHER): Payer: Medicare Other | Admitting: Family Medicine

## 2017-10-31 ENCOUNTER — Other Ambulatory Visit: Payer: Self-pay

## 2017-10-31 ENCOUNTER — Encounter: Payer: Self-pay | Admitting: Family Medicine

## 2017-10-31 VITALS — BP 130/78 | HR 74 | Temp 98.2°F | Resp 18

## 2017-10-31 DIAGNOSIS — L0293 Carbuncle, unspecified: Secondary | ICD-10-CM | POA: Diagnosis not present

## 2017-10-31 DIAGNOSIS — N182 Chronic kidney disease, stage 2 (mild): Secondary | ICD-10-CM

## 2017-10-31 DIAGNOSIS — R109 Unspecified abdominal pain: Secondary | ICD-10-CM

## 2017-10-31 DIAGNOSIS — Z23 Encounter for immunization: Secondary | ICD-10-CM | POA: Diagnosis not present

## 2017-10-31 DIAGNOSIS — G8929 Other chronic pain: Secondary | ICD-10-CM | POA: Diagnosis not present

## 2017-10-31 DIAGNOSIS — E1143 Type 2 diabetes mellitus with diabetic autonomic (poly)neuropathy: Secondary | ICD-10-CM | POA: Diagnosis not present

## 2017-10-31 DIAGNOSIS — I10 Essential (primary) hypertension: Secondary | ICD-10-CM | POA: Diagnosis not present

## 2017-10-31 DIAGNOSIS — N183 Chronic kidney disease, stage 3 unspecified: Secondary | ICD-10-CM

## 2017-10-31 DIAGNOSIS — E1122 Type 2 diabetes mellitus with diabetic chronic kidney disease: Secondary | ICD-10-CM

## 2017-10-31 DIAGNOSIS — E782 Mixed hyperlipidemia: Secondary | ICD-10-CM | POA: Diagnosis not present

## 2017-10-31 DIAGNOSIS — I69959 Hemiplegia and hemiparesis following unspecified cerebrovascular disease affecting unspecified side: Secondary | ICD-10-CM

## 2017-10-31 DIAGNOSIS — Z794 Long term (current) use of insulin: Secondary | ICD-10-CM

## 2017-10-31 DIAGNOSIS — Z8673 Personal history of transient ischemic attack (TIA), and cerebral infarction without residual deficits: Secondary | ICD-10-CM | POA: Diagnosis not present

## 2017-10-31 MED ORDER — OXYCODONE-ACETAMINOPHEN 7.5-325 MG PO TABS: 1.0000 | ORAL_TABLET | ORAL | 0 refills | Status: DC | PRN

## 2017-10-31 MED ORDER — DOXYCYCLINE HYCLATE 100 MG PO TABS
100.0000 mg | ORAL_TABLET | Freq: Two times a day (BID) | ORAL | 0 refills | Status: DC
Start: 2017-10-31 — End: 2017-11-27

## 2017-10-31 NOTE — Assessment & Plan Note (Signed)
Check cholesterol.  On statin drug

## 2017-10-31 NOTE — Patient Instructions (Addendum)
Take antibiotics as prescribed  F/U 6 months Physical  Flu shot given

## 2017-10-31 NOTE — Assessment & Plan Note (Signed)
His chronic pain difficulties with his stroke he cannot ambulate have all caused some decreased mood over the years.  He is already on multiple medications we will not add anything else at this time.

## 2017-10-31 NOTE — Progress Notes (Signed)
Subjective:    Patient ID: [name], male    DOB: [birth year], 71 y.o.   MRN: [id number]  Patient presents for Follow-up (is not fasting) and Skin Irritation (has boil like areas to back- has completed ABTx, but has not resolved)   Pt here to f/u chronic medical problems Last visit in July He has recurrent boils, given bactrim recently, still has not resolved, he was referred to Infectious disease back in July. Note reviewed recommended Chlorhexidine soap weekly, and given bactroban to nares  DM- followed by endocrinology , A1C  6.7% in Sept   HTN- BP controlled on current medications, no side effects   Chronic abdominal pain- on percocet  Seen by podiatry told nothing else can be done for his neuropathy.  He is on gabapentin.  Would not use Lyrica secondary to side effects and his other medications.  Follow-up by ophthalmology recent visit his vision is stable.    Review Of Systems:  GEN- denies fatigue, fever, weight loss,weakness, recent illness HEENT- denies eye drainage, change in vision, nasal discharge, CVS- denies chest pain, palpitations RESP- denies SOB, cough, wheeze ABD- denies N/V, change in stools, +abd pain GU- denies dysuria, hematuria, dribbling, incontinence MSK- denies joint pain, muscle aches, injury Neuro- denies headache, dizziness, syncope, seizure activity       Objective:    BP 130/78   Pulse 74   Temp 98.2 F (36.8 C) (Oral)   Resp 18   SpO2 99%  GEN- NAD, alert and oriented x3, sitting in wheelchair, right hemiplegia HEENT- PERRL, EOMI, non injected sclera, pink conjunctiva, MMM, oropharynx clear Neck- Supple, no thryomegaly, no LAD CVS- RRR, no murmur RESP-CTAB ABD-NABS,soft, mild TTP RUQ, no rebound, no guarding  Skin- small cyst right  upper back, multiple scarring areas, few pits from cyst  EXT- chronic non pitting edema bilat  Pulses- Radial 2+l, DP- 1+  Verbal consent obtained Anti-septic ETOH Applied cold spray, small nick  with 11 blade Very small amoount of thick Cystic content expressed  Bandage applied       Assessment & Plan:      Problem List Items Addressed This Visit      Unprioritized   History of CVA (cerebrovascular accident)   Diabetic neuropathy ([place])   Hemiplegia of dominant side as late effect following cerebrovascular disease ([place]) (Chronic)   CKD (chronic kidney disease), stage III ([place]) - Primary   Type 2 diabetes mellitus with stage 2 chronic kidney disease ([place])   Relevant Orders   Comprehensive metabolic panel   CBC with Differential/Platelet   Recurrent boils    Recurrent boils however the one that was of concern is an epidermal cyst which she also has multiple of these on his back as well.  At this time because he has so many and they are small does not need to be excised.  I did give them a refill of doxycycline to have on hand in case he does get another pustular boil      Mixed hyperlipidemia    Check cholesterol.  On statin drug      Relevant Orders   Lipid panel   Essential hypertension    Blood pressure is controlled and patient medication.  Labs drawn today diabetes is well controlled Followed by endocrinology      Chronic abdominal pain    His chronic pain difficulties with his stroke he cannot ambulate have all caused some decreased mood over the years.  He is already on multiple  medications we will not add anything else at this time.       Relevant Medications   oxyCODONE-acetaminophen (PERCOCET) 7.5-325 MG tablet    Other Visit Diagnoses    Need for prophylactic vaccination and inoculation against influenza       Relevant Orders   Flu vaccine HIGH DOSE PF (Fluzone High dose) (Completed)      Note: This dictation was prepared with Dragon dictation along with smaller phrase technology. Any transcriptional errors that result from this process are unintentional.

## 2017-10-31 NOTE — Assessment & Plan Note (Signed)
Blood pressure is controlled and patient medication.  Labs drawn today diabetes is well controlled Followed by endocrinology

## 2017-10-31 NOTE — Assessment & Plan Note (Addendum)
Recurrent boils however the one that was of concern is an epidermal cyst which she also has multiple of these on his back as well.  At this time because he has so many and they are small does not need to be excised.  I did give them a refill of doxycycline to have on hand in case he does get another pustular boil

## 2017-11-01 DIAGNOSIS — E1122 Type 2 diabetes mellitus with diabetic chronic kidney disease: Secondary | ICD-10-CM | POA: Diagnosis not present

## 2017-11-01 DIAGNOSIS — N182 Chronic kidney disease, stage 2 (mild): Secondary | ICD-10-CM | POA: Diagnosis not present

## 2017-11-01 DIAGNOSIS — Z794 Long term (current) use of insulin: Secondary | ICD-10-CM | POA: Diagnosis not present

## 2017-11-01 DIAGNOSIS — E782 Mixed hyperlipidemia: Secondary | ICD-10-CM | POA: Diagnosis not present

## 2017-11-01 LAB — CBC WITH DIFFERENTIAL/PLATELET
BASOS ABS: 71 {cells}/uL (ref 0–200)
Basophils Relative: 0.8 %
EOS PCT: 7.9 %
Eosinophils Absolute: 703 cells/uL — ABNORMAL HIGH (ref 15–500)
HCT: 43 % (ref 38.5–50.0)
HEMOGLOBIN: 13.5 g/dL (ref 13.2–17.1)
Lymphs Abs: 1335 cells/uL (ref 850–3900)
MCH: 23.4 pg — ABNORMAL LOW (ref 27.0–33.0)
MCHC: 31.4 g/dL — AB (ref 32.0–36.0)
MCV: 74.4 fL — ABNORMAL LOW (ref 80.0–100.0)
MONOS PCT: 6.6 %
NEUTROS ABS: 6203 {cells}/uL (ref 1500–7800)
Neutrophils Relative %: 69.7 %
Platelets: 275 10*3/uL (ref 140–400)
RBC: 5.78 10*6/uL (ref 4.20–5.80)
RDW: 17.9 % — AB (ref 11.0–15.0)
Total Lymphocyte: 15 %
WBC mixed population: 587 cells/uL (ref 200–950)
WBC: 8.9 10*3/uL (ref 3.8–10.8)

## 2017-11-01 LAB — COMPREHENSIVE METABOLIC PANEL
AG Ratio: 1.3 (calc) (ref 1.0–2.5)
ALBUMIN MSPROF: 4.1 g/dL (ref 3.6–5.1)
ALKALINE PHOSPHATASE (APISO): 78 U/L (ref 40–115)
ALT: 14 U/L (ref 9–46)
AST: 15 U/L (ref 10–35)
BUN/Creatinine Ratio: 12 (calc) (ref 6–22)
BUN: 18 mg/dL (ref 7–25)
CALCIUM: 10.8 mg/dL — AB (ref 8.6–10.3)
CO2: 30 mmol/L (ref 20–32)
Chloride: 104 mmol/L (ref 98–110)
Creat: 1.46 mg/dL — ABNORMAL HIGH (ref 0.70–1.18)
Globulin: 3.2 g/dL (calc) (ref 1.9–3.7)
Glucose, Bld: 68 mg/dL (ref 65–99)
Potassium: 5.2 mmol/L (ref 3.5–5.3)
Sodium: 140 mmol/L (ref 135–146)
Total Bilirubin: 0.4 mg/dL (ref 0.2–1.2)
Total Protein: 7.3 g/dL (ref 6.1–8.1)

## 2017-11-01 LAB — LIPID PANEL
CHOL/HDL RATIO: 2.7 (calc) (ref <5.0)
CHOLESTEROL: 151 mg/dL (ref <200)
HDL: 56 mg/dL (ref >40)
LDL Cholesterol (Calc): 66 mg/dL (calc)
Non-HDL Cholesterol (Calc): 95 mg/dL (calc) (ref <130)
TRIGLYCERIDES: 219 mg/dL — AB (ref <150)

## 2017-11-13 ENCOUNTER — Other Ambulatory Visit: Payer: Self-pay | Admitting: "Endocrinology

## 2017-11-15 ENCOUNTER — Encounter: Payer: Self-pay | Admitting: "Endocrinology

## 2017-11-15 ENCOUNTER — Ambulatory Visit: Payer: Medicare Other | Admitting: "Endocrinology

## 2017-11-22 ENCOUNTER — Other Ambulatory Visit: Payer: Self-pay | Admitting: Family Medicine

## 2017-11-22 NOTE — Telephone Encounter (Signed)
Okay to refill? 

## 2017-11-22 NOTE — Telephone Encounter (Signed)
Ok to refill Zanaflex?

## 2017-11-24 ENCOUNTER — Other Ambulatory Visit: Payer: Self-pay | Admitting: Family Medicine

## 2017-11-27 ENCOUNTER — Telehealth: Payer: Self-pay | Admitting: *Deleted

## 2017-11-27 MED ORDER — SULFAMETHOXAZOLE-TRIMETHOPRIM 800-160 MG PO TABS: 1.0000 | ORAL_TABLET | Freq: Two times a day (BID) | ORAL | 0 refills | Status: DC

## 2017-11-27 NOTE — Telephone Encounter (Signed)
Received call from patient wife, [name].   States that patient has boil to back x3. Requested order for ABTx.   MD please advise.

## 2017-11-27 NOTE — Telephone Encounter (Signed)
noted 

## 2017-11-27 NOTE — Telephone Encounter (Signed)
Call placed to patient and patient wife [name] made aware.   States that area has pus draining from it and patient has new area. States that she thinks where he is sitting on it keeps it from healing correctly.   Prescription sent to pharmacy.   MD to be made aware.

## 2017-11-27 NOTE — Telephone Encounter (Signed)
He was given doxycycline, if it is not going down, this may be more of a cyst, like he had before and the antibiotic wont work it has to be cut open or squeezed out  If there is any redness, and true pus, odor- she can try bactrim DS again 1 tab BID for 7 days

## 2017-12-02 ENCOUNTER — Other Ambulatory Visit: Payer: Self-pay | Admitting: Family Medicine

## 2017-12-02 ENCOUNTER — Other Ambulatory Visit: Payer: Self-pay | Admitting: "Endocrinology

## 2017-12-07 DIAGNOSIS — Z794 Long term (current) use of insulin: Secondary | ICD-10-CM | POA: Diagnosis not present

## 2017-12-07 DIAGNOSIS — N182 Chronic kidney disease, stage 2 (mild): Secondary | ICD-10-CM | POA: Diagnosis not present

## 2017-12-07 DIAGNOSIS — E039 Hypothyroidism, unspecified: Secondary | ICD-10-CM | POA: Diagnosis not present

## 2017-12-07 DIAGNOSIS — E1122 Type 2 diabetes mellitus with diabetic chronic kidney disease: Secondary | ICD-10-CM | POA: Diagnosis not present

## 2017-12-08 LAB — HEMOGLOBIN A1C
HEMOGLOBIN A1C: 6.4 %{Hb} — AB (ref <5.7)
Mean Plasma Glucose: 137 (calc)
eAG (mmol/L): 7.6 (calc)

## 2017-12-08 LAB — RENAL FUNCTION PANEL
Albumin: 4.1 g/dL (ref 3.6–5.1)
BUN/Creatinine Ratio: 12 (calc) (ref 6–22)
BUN: 19 mg/dL (ref 7–25)
CO2: 27 mmol/L (ref 20–32)
CREATININE: 1.56 mg/dL — AB (ref 0.70–1.18)
Calcium: 11.1 mg/dL — ABNORMAL HIGH (ref 8.6–10.3)
Chloride: 103 mmol/L (ref 98–110)
Glucose, Bld: 76 mg/dL (ref 65–99)
POTASSIUM: 4.9 mmol/L (ref 3.5–5.3)
Phosphorus: 2.7 mg/dL (ref 2.1–4.3)
SODIUM: 138 mmol/L (ref 135–146)

## 2017-12-08 LAB — TSH: TSH: 1.88 m[IU]/L (ref 0.40–4.50)

## 2017-12-08 LAB — T4, FREE: FREE T4: 1.2 ng/dL (ref 0.8–1.8)

## 2017-12-13 ENCOUNTER — Other Ambulatory Visit: Payer: Self-pay | Admitting: Family Medicine

## 2017-12-14 ENCOUNTER — Other Ambulatory Visit: Payer: Self-pay | Admitting: Family Medicine

## 2017-12-19 ENCOUNTER — Ambulatory Visit (INDEPENDENT_AMBULATORY_CARE_PROVIDER_SITE_OTHER): Payer: Medicare Other | Admitting: "Endocrinology

## 2017-12-19 ENCOUNTER — Encounter: Payer: Self-pay | Admitting: "Endocrinology

## 2017-12-19 VITALS — BP 148/83 | HR 67 | Ht 72.0 in

## 2017-12-19 DIAGNOSIS — E1122 Type 2 diabetes mellitus with diabetic chronic kidney disease: Secondary | ICD-10-CM

## 2017-12-19 DIAGNOSIS — E039 Hypothyroidism, unspecified: Secondary | ICD-10-CM | POA: Diagnosis not present

## 2017-12-19 DIAGNOSIS — I10 Essential (primary) hypertension: Secondary | ICD-10-CM

## 2017-12-19 DIAGNOSIS — Z794 Long term (current) use of insulin: Secondary | ICD-10-CM | POA: Diagnosis not present

## 2017-12-19 DIAGNOSIS — N182 Chronic kidney disease, stage 2 (mild): Secondary | ICD-10-CM | POA: Diagnosis not present

## 2017-12-19 DIAGNOSIS — E782 Mixed hyperlipidemia: Secondary | ICD-10-CM | POA: Diagnosis not present

## 2017-12-19 NOTE — Progress Notes (Signed)
Subjective:    Patient ID: [name], male    DOB: [birth year],    Past Medical History:  Diagnosis Date  . Arthritis   . Asthma    "hx of"  . Barrett's esophagus    EGD 03/23/2011 & EGD 2/09 bx proven  . BPH (benign prostatic hyperplasia)   . Cancer of parotid gland ([place]) 11/23/12   Adenocarcinoma  . Chronic abdominal pain   . Colon polyp 03/23/2011   tubular adenoma, Dr. Gala Romney  . 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 ([place]) 07/15/2011  . CVA (cerebral infarction) 1998   right sided deficit  . Diverticulosis    TCS 03/23/11 pancolonic diverticula &TCS 5/08, pancolonic diverticula  . DM (diabetes mellitus) ([place])   . Edema of lower extremity 12/21/12   bilateral   . Esotropia of left eye   . GERD (gastroesophageal reflux disease)   . Glaucoma (increased eye pressure)   . Gout   . Gout   . Hemorrhagic colitis 06/06/2012.  [name] Hemorrhoids, internal 03/23/2011   tcs by Dr. Gala Romney  . Hepatitis    esosiniphilic, tx with prednisone  . Hiatal hernia   . HTN (hypertension)   . Hx of radiation therapy 1974   right base of skull area-lymphoma  . Hyperlipidemia   . Lower facial weakness    Right  . Lymphoma ([place]) 1974   XRT at [facility], right base of skull area  . Neuropathy   . Renal insufficiency   . Steatohepatitis    liver biopsy 2009  . Stroke [facility]) 1998   right hemiparesis/plegia   Past Surgical History:  Procedure Laterality Date  . BIOPSY  12/01/2016   Procedure: BIOPSY;  Surgeon: Daneil Dolin, MD;  Location: [facility];  Service: Endoscopy;;  duodenum, gastric, esophagus  . CHOLECYSTECTOMY    . COLONOSCOPY  03/23/11   Dr. Gala Romney  pancolonic diverticula, hemorrhoids, tubular adenoma.. next tcs 03/2016  . COLONOSCOPY WITH PROPOFOL N/A 12/01/2016   inadequate bowel prep precluded exam  . ESOPHAGOGASTRODUODENOSCOPY  02/05/08   goblet cell metaplasia/negative for H.pylori  . ESOPHAGOGASTRODUODENOSCOPY  03/23/11   Dr. Gala Romney, barretts, hiatal hernia  .  ESOPHAGOGASTRODUODENOSCOPY (EGD) WITH PROPOFOL N/A 12/01/2016   Dr. Gala Romney: Large amount of retained gastric contents precluded completion of the stomach. Mucosal changes were found in the stomach. Erosions and somewhat scalloped appearing mucosa present, reactive gastritis/no H pylori. Barrett's esophagus noted, no dysplasia on biopsy. Duodenal biopsies taken as well, benign, no evidence of eosinophilia.  [name] MASS BIOPSY  11/01/2012   Procedure: NECK MASS BIOPSY;  Surgeon: Ascencion Dike, MD;  Location: [facility] ORS;  Service: ENT;  Laterality: Right;  Excisional Bx Right Neck Mass; attempted external jugular cutdown of left side  . PAROTIDECTOMY  11/24/2012   Procedure: PAROTIDECTOMY;  Surgeon: Ascencion Dike, MD;  Location: [place];  Service: ENT;  Laterality: N/A;  Total parotidectomy  . PLEURECTOMY    . right lymph node removal Right    behind right ear  . Right video-assisted thoracic surgery, pleurectomy, and pleurodesis  2011   Social History   Socioeconomic History  . Marital status: Married    Spouse name: None  . Number of children: 1  . Years of education: None  . Highest education level: None  Social Needs  . Financial resource strain: None  . Food insecurity - worry: None  . Food insecurity - inability: None  . Transportation needs - medical: None  .  Transportation needs - non-medical: None  Occupational History  . Occupation: disabled  Tobacco Use  . Smoking status: Former Smoker    Packs/day: 3.00    Years: 25.00    Pack years: 75.00    Types: Cigarettes    Last attempt to quit: 03/01/1997    Years since quitting: 20.8  . Smokeless tobacco: Never Used  Substance and Sexual Activity  . Alcohol use: No  . Drug use: No  . Sexual activity: No  Other Topics Concern  . None  Social History Narrative  . None   Outpatient Encounter Medications as of 12/19/2017  Medication Sig  . allopurinol (ZYLOPRIM) 300 MG tablet TAKE 1 TABLET BY MOUTH EVERY DAY  . amLODipine (NORVASC) 10 MG  tablet TAKE 1 TABLET BY MOUTH DAILY  . cloNIDine (CATAPRES) 0.2 MG tablet TAKE 1 TABLET BY MOUTH THREE TIMES DAILY  . clotrimazole-betamethasone (LOTRISONE) cream APPLY EXTERNALLY TO THE AFFECTED AREA TWICE DAILY  . furosemide (LASIX) 40 MG tablet Take 1 tablet (40 mg total) by mouth daily.  [name] gabapentin (NEURONTIN) 300 MG capsule TAKE 2 CAPSULES(600 MG) BY MOUTH THREE TIMES DAILY  . Insulin Glargine (LANTUS SOLOSTAR) 100 UNIT/ML Solostar Pen Inject 50 Units into the skin at bedtime. (Patient taking differently: Inject 55 Units into the skin at bedtime. )  . levothyroxine (SYNTHROID, LEVOTHROID) 88 MCG tablet TAKE 1 TABLET(88 MCG) BY MOUTH DAILY  . lisinopril (PRINIVIL,ZESTRIL) 20 MG tablet TAKE 1 TABLET BY MOUTH DAILY  . lubiprostone (AMITIZA) 24 MCG capsule Take 1 capsule (24 mcg total) by mouth 2 (two) times daily with a meal.  . metFORMIN (GLUCOPHAGE) 500 MG tablet TAKE 1 TABLET BY MOUTH TWICE DAILY  . metoprolol succinate (TOPROL-XL) 50 MG 24 hr tablet TAKE 1 TABLET BY MOUTH DAILY WITH OR IMMEDIATELY FOLLOWING A MEAL  . ONETOUCH DELICA LANCETS FINE MISC USE AS DIRECTED FOUR TIMES DAILY  . ONETOUCH VERIO test strip TEST FOUR TIMES DAILY  . ONETOUCH VERIO test strip USE TO TEST BLOOD SUGAR FOUR TIMES DAILY  . oxyCODONE-acetaminophen (PERCOCET) 7.5-325 MG tablet Take 1 tablet by mouth every 4 (four) hours as needed.  . pantoprazole (PROTONIX) 40 MG tablet TAKE 1 TABLET BY MOUTH DAILY  . potassium chloride SA (K-DUR,KLOR-CON) 20 MEQ tablet TAKE 1 TABLET(20 MEQ) BY MOUTH DAILY  . simvastatin (ZOCOR) 20 MG tablet TAKE 1 TABLET BY MOUTH EVERY NIGHT AT BEDTIME  . sulfamethoxazole-trimethoprim (BACTRIM DS,SEPTRA DS) 800-160 MG tablet Take 1 tablet by mouth 2 (two) times daily.  . tamsulosin (FLOMAX) 0.4 MG CAPS capsule TAKE 1 CAPSULE BY MOUTH DAILY  . tiZANidine (ZANAFLEX) 2 MG tablet TAKE 1 TABLET(2 MG) BY MOUTH TWICE DAILY AS NEEDED  . [DISCONTINUED] furosemide (LASIX) 40 MG tablet TAKE 1 TABLET  BY MOUTH DAILY   No facility-administered encounter medications on file as of 12/19/2017.    ALLERGIES: No Known Allergies VACCINATION STATUS: Immunization History  Administered Date(s) Administered  . Influenza Split 08/24/2012  . Influenza, High Dose Seasonal PF 10/31/2017  . Influenza,inj,Quad PF,6+ Mos 10/09/2013, 09/05/2014, 11/23/2015, 08/16/2016  . Pneumococcal Conjugate-13 01/07/2014  . Pneumococcal Polysaccharide-23 11/25/2012    Diabetes  He presents for his follow-up diabetic visit. He has type 2 diabetes mellitus. Onset time: diagnosed approx at age 79. His disease course has been worsening. There are no hypoglycemic associated symptoms. Pertinent negatives for hypoglycemia include no confusion, headaches, pallor or seizures. Associated symptoms include weakness. Pertinent negatives for diabetes include no chest pain, no fatigue, no polydipsia, no polyphagia  and no polyuria. Symptoms are improving. Diabetic complications include a CVA. Risk factors for coronary artery disease include diabetes mellitus, dyslipidemia, hypertension, male sex, obesity, sedentary lifestyle and tobacco exposure. Current diabetic treatment includes insulin injections. He is following a generally unhealthy diet. When asked about meal planning, he reported none. He has had a previous visit with a dietitian. He never participates in exercise. His breakfast blood glucose range is generally 140-180 mg/dl. His overall blood glucose range is 140-180 mg/dl. An ACE inhibitor/angiotensin II receptor blocker is being taken.  Hyperlipidemia  Pertinent negatives include no chest pain, myalgias or shortness of breath.  Hypertension  Pertinent negatives include no chest pain, headaches, neck pain, palpitations or shortness of breath. Hypertensive end-organ damage includes CVA.     Review of Systems  Constitutional: Negative for chills, fatigue, fever and unexpected weight change.  HENT: Negative for dental problem,  mouth sores and trouble swallowing.   Eyes: Negative for visual disturbance.  Respiratory: Negative for cough, choking, chest tightness, shortness of breath and wheezing.   Cardiovascular: Negative for chest pain, palpitations and leg swelling.  Gastrointestinal: Negative for abdominal distention, abdominal pain, constipation, diarrhea, nausea and vomiting.  Endocrine: Negative for polydipsia, polyphagia and polyuria.  Genitourinary: Negative for dysuria, flank pain, hematuria and urgency.  Musculoskeletal: Negative for back pain, gait problem, joint swelling, myalgias and neck pain.  Skin: Negative for pallor, rash and wound.  Neurological: Positive for weakness. Negative for seizures, numbness and headaches.  Psychiatric/Behavioral: Negative for confusion, dysphoric mood, hallucinations and suicidal ideas.    Objective:    BP (!) 148/83   Pulse 67   Ht 6' (1.829 m)   BMI 37.30 kg/m   Wt Readings from Last 3 Encounters:  07/11/17 275 lb (124.7 kg)  12/16/16 275 lb (124.7 kg)  12/01/16 275 lb (124.7 kg)    Physical Exam  Constitutional: He is oriented to person, place, and time. He appears well-developed and well-nourished. He is cooperative.  HENT:  Head: Normocephalic and atraumatic.  Eyes: EOM are normal.  Neck: Normal range of motion. Neck supple. No tracheal deviation present. No thyromegaly present.  Cardiovascular: Normal rate and normal heart sounds. Exam reveals no gallop.  No murmur heard. Pulses:      Dorsalis pedis pulses are 1+ on the right side.       Posterior tibial pulses are 1+ on the right side.  Pulmonary/Chest: Breath sounds normal. No respiratory distress. He has no wheezes.  Abdominal: Soft. Bowel sounds are normal. He exhibits no distension.  Musculoskeletal: He exhibits no edema.  Neurological: He is alert and oriented to person, place, and time. A sensory deficit is present. No cranial nerve deficit. He exhibits normal muscle tone. Gait normal.  Pt is  wheelchair bound due to past recurrent CVA  Skin: Skin is warm and dry. No rash noted. No cyanosis. Nails show no clubbing.  Psychiatric: He has a normal mood and affect. His speech is normal and behavior is normal. Judgment and thought content normal. Cognition and memory are normal.    CMP  CMP Latest Ref Rng & Units 12/07/2017 11/01/2017 08/12/2017  Glucose 65 - 99 mg/dL 76 68 92  BUN 7 - 25 mg/dL 19 18 19   Creatinine 0.70 - 1.18 mg/dL 1.56(H) 1.46(H) 1.31(H)  Sodium 135 - 146 mmol/L 138 140 140  Potassium 3.5 - 5.3 mmol/L 4.9 5.2 4.7  Chloride 98 - 110 mmol/L 103 104 105  CO2 20 - 32 mmol/L 27 30 21   Calcium  8.6 - 10.3 mg/dL 11.1(H) 10.8(H) 10.7(H)  Total Protein 6.1 - 8.1 g/dL - 7.3 -  Total Bilirubin 0.2 - 1.2 mg/dL - 0.4 -  Alkaline Phos 40 - 115 U/L - - -  AST 10 - 35 U/L - 15 -  ALT 9 - 46 U/L - 14 -    Diabetic Labs (most recent): Lab Results  Component Value Date   HGBA1C 6.4 (H) 12/07/2017   HGBA1C 6.7 (H) 08/12/2017   HGBA1C 7.2 (H) 05/04/2017   Lipid Panel     Component Value Date/Time   CHOL 151 11/01/2017 0828   TRIG 219 (H) 11/01/2017 0828   HDL 56 11/01/2017 0828   CHOLHDL 2.7 11/01/2017 0828   VLDL 66 (H) 05/04/2017 0931   LDLCALC 51 05/04/2017 0931     Assessment & Plan:   1. Type 2 diabetes mellitus with stage 2 chronic kidney disease and CVA - patient remains at a high risk for more acute and chronic complications of diabetes which include CAD, CVA, CKD, retinopathy, and neuropathy. These are all discussed in detail with the patient.  Patient came with controlled glucose profile, and  recent A1c of   6.4%. No documented or reported hypoglycemia.   Glucose logs and insulin administration records pertaining to this visit,  to be scanned into patient's records.  Recent labs reviewed.   - I have re-counseled the patient on diet management and weight loss  by adopting a carbohydrate restricted / protein rich  Diet.  -  Suggestion is made for him to  avoid simple carbohydrates  from his diet including Cakes, Sweet Desserts / Pastries, Ice Cream, Soda (diet and regular), Sweet Tea, Candies, Chips, Cookies, Store Bought Juices, Alcohol in Excess of  1-2 drinks a day, Artificial Sweeteners, and "Sugar-free" Products. This will help patient to have stable blood glucose profile and potentially avoid unintended weight gain.   - He admits to dietary indiscretion specifically consumes large quantity of potato chips. - Patient is advised to stick to a routine mealtimes to eat 3 meals  a day and avoid unnecessary snacks ( to snack only to correct hypoglycemia).  - I have approached patient with the following individualized plan to manage diabetes and patient agrees.  - I will   continue with Lantus  55 units daily at bedtime, continue to hold Humalog for now. - I advised for him to continue monitoring blood glucose 2 times a day-before breakfast and daily at bedtime.  -Patient is encouraged to call clinic for blood glucose levels less than 70 or above 300 mg /dl.  - I will continue  Metformin 563m  by mouth twice a day with meals.   -Patient is not a candidate for SGLT2 inhibitors due to CKD.  - If he maintains this control of diabetes he will be [facility] for [facility].  - Patient specific target  for A1c; LDL, HDL, Triglycerides, and  Waist Circumference were discussed in detail.  2) BP/HTN: Controlled. Continue current medications including ACEI/ARB. 3) Lipids/HPL:  continue statins. 4)  Weight/Diet: He is limited on how much he can exercise and carbohydrates information provided.  5) hypothyroidism: - He is thyroid function tests are consistent with appropriate replacement.  - I advised him to continue   levothyroxine 88 g by mouth every morning.  - We discussed about correct intake of levothyroxine, at fasting, with water, separated by at least 30 minutes from breakfast, and separated by more than 4 hours from calcium, iron,  multivitamins,  acid reflux medications (PPIs). -Patient is made aware of the fact that thyroid hormone replacement is needed for life, dose to be adjusted by periodic monitoring of thyroid function tests.  6) Chronic Care/Health Maintenance:  -Patient is on ACEI/ARB and Statin medications and encouraged to continue to follow up with Ophthalmology, Podiatrist at least yearly or according to recommendations, and advised to  stay away from smoking. I have recommended yearly flu vaccine and pneumonia vaccination at least every 5 years; moderate intensity exercise for up to 150 minutes weekly; and  sleep for at least 7 hours a day. Leg elevation when sitting and lying in bed is advised to prevent dependent bilateral lower extremity edema.   - Time spent with the patient: 25 min, of which >50% was spent in reviewing his blood glucose logs , discussing his hypo- and hyper-glycemic episodes, reviewing his current and  previous labs and insulin doses and developing a plan to avoid hypo- and hyper-glycemia. Please refer to Patient Instructions for Blood Glucose Monitoring and Insulin/Medications Dosing Guide"  in media tab for additional information.   - I advised patient to maintain close follow up with Dr. Buelah Manis for primary care needs.  Follow up plan: Return in about 4 months (around 04/18/2018) for meter, and logs.  Glade Lloyd, MD Phone: [phone]  Fax: [phone]  This note was partially dictated with voice recognition software. Similar sounding words can be transcribed inadequately or may not  be corrected upon review.  12/19/2017, 3:37 PM

## 2017-12-19 NOTE — Patient Instructions (Signed)

## 2017-12-22 ENCOUNTER — Other Ambulatory Visit: Payer: Self-pay | Admitting: Family Medicine

## 2017-12-29 ENCOUNTER — Other Ambulatory Visit: Payer: Self-pay | Admitting: Family Medicine

## 2018-01-12 ENCOUNTER — Other Ambulatory Visit: Payer: Self-pay | Admitting: Family Medicine

## 2018-01-12 ENCOUNTER — Other Ambulatory Visit: Payer: Self-pay | Admitting: "Endocrinology

## 2018-01-12 ENCOUNTER — Other Ambulatory Visit: Payer: Self-pay | Admitting: *Deleted

## 2018-01-15 ENCOUNTER — Other Ambulatory Visit: Payer: Self-pay | Admitting: "Endocrinology

## 2018-01-15 ENCOUNTER — Other Ambulatory Visit: Payer: Self-pay | Admitting: Family Medicine

## 2018-01-17 ENCOUNTER — Ambulatory Visit: Payer: Medicare Other | Admitting: Podiatry

## 2018-01-17 ENCOUNTER — Encounter: Payer: Self-pay | Admitting: Podiatry

## 2018-01-17 DIAGNOSIS — B351 Tinea unguium: Secondary | ICD-10-CM

## 2018-01-17 DIAGNOSIS — L03031 Cellulitis of right toe: Secondary | ICD-10-CM

## 2018-01-17 DIAGNOSIS — E1149 Type 2 diabetes mellitus with other diabetic neurological complication: Secondary | ICD-10-CM | POA: Diagnosis not present

## 2018-01-17 DIAGNOSIS — M79676 Pain in unspecified toe(s): Secondary | ICD-10-CM | POA: Diagnosis not present

## 2018-01-17 DIAGNOSIS — R609 Edema, unspecified: Secondary | ICD-10-CM

## 2018-01-17 NOTE — Progress Notes (Addendum)
Patient ID: [name], male   DOB: 13-May-1946, 72 y.o.   MRN: [id number]  Complaint:  Visit Type: Patient returns to my office for continued preventative foot care services. Complaint: Patient states" my nails have grown long and thick and become painful to walk and wear shoes" Patient has been diagnosed with DM with  neuropathy.  Post CVA with significant swelling both feet.[name] He presents for preventative foot care services. No changes to ROS.    Podiatric Exam: Vascular: dorsalis pedis and posterior tibial pulses are not palpable due to swelling both feet.. Capillary return is immediate. Temperature gradient is WNL. Skin turgor WNL  Sensorium: Diminished  Semmes Weinstein monofilament test. Normal tactile sensation. Nail Exam: Pt has thick disfigured discolored nails with subungual debris noted bilateral entire nail hallux through fifth toenails.  Marked incurvation noted third toe right foot medially. Ulcer Exam: There is no evidence of ulcer or pre-ulcerative changes or infection. Orthopedic Exam: Muscle tone and strength are WNL. No limitations in general ROM. No crepitus or effusions noted. Foot type and digits show no abnormalities. Bony prominences are unremarkable. Skin: No Porokeratosis. No infection or ulcers  Diagnosis:  Tinea unguium, Pain in right toe, pain in left toes  Treatment & Plan Procedures and Treatment: Consent by patient was obtained for treatment procedures. The patient understood the discussion of treatment and procedures well. All questions were answered thoroughly reviewed. Debridement of mycotic and hypertrophic toenails, 1 through 5 bilateral and clearing of subungual debris. Incision and drainage medial border third toe right. Neosporin/DSD.  Home soak instructions dispensed. ABN signed for 2019. Return Visit-Office Procedure: Patient instructed to return to the office for a follow up visit 3 months for continued evaluation and treatment.   Gardiner Barefoot DPM

## 2018-01-31 ENCOUNTER — Ambulatory Visit: Payer: Medicare Other | Admitting: Family Medicine

## 2018-02-06 ENCOUNTER — Other Ambulatory Visit: Payer: Self-pay

## 2018-02-06 ENCOUNTER — Encounter: Payer: Self-pay | Admitting: Family Medicine

## 2018-02-06 ENCOUNTER — Ambulatory Visit (INDEPENDENT_AMBULATORY_CARE_PROVIDER_SITE_OTHER): Payer: Medicare Other | Admitting: Family Medicine

## 2018-02-06 VITALS — BP 130/74 | HR 70 | Temp 98.1°F | Resp 16

## 2018-02-06 DIAGNOSIS — N183 Chronic kidney disease, stage 3 unspecified: Secondary | ICD-10-CM

## 2018-02-06 DIAGNOSIS — L853 Xerosis cutis: Secondary | ICD-10-CM | POA: Diagnosis not present

## 2018-02-06 DIAGNOSIS — B351 Tinea unguium: Secondary | ICD-10-CM | POA: Diagnosis not present

## 2018-02-06 DIAGNOSIS — Z85818 Personal history of malignant neoplasm of other sites of lip, oral cavity, and pharynx: Secondary | ICD-10-CM

## 2018-02-06 DIAGNOSIS — R600 Localized edema: Secondary | ICD-10-CM

## 2018-02-06 MED ORDER — DOXYCYCLINE HYCLATE 100 MG PO TABS: 100.0000 mg | ORAL_TABLET | Freq: Two times a day (BID) | ORAL | 0 refills | Status: DC

## 2018-02-06 MED ORDER — CLOTRIMAZOLE 1 % EX CREA: 1.0000 "application " | TOPICAL_CREAM | Freq: Two times a day (BID) | CUTANEOUS | 0 refills | Status: DC

## 2018-02-06 MED ORDER — TRIAMCINOLONE 0.1 % CREAM:EUCERIN CREAM 1:1: 1.0000 "application " | TOPICAL_CREAM | Freq: Two times a day (BID) | CUTANEOUS | 2 refills | Status: DC | PRN

## 2018-02-06 NOTE — Progress Notes (Signed)
   Subjective:    Patient ID: [name], male    DOB: 1946/07/01, 72 y.o.   MRN: [id number]  Patient presents for Recurrent Boils (to back); Pain to face (pain on R side of face near ear); and Foot Pain (R foot middle toe- has been seen by podiatrist)  Pt here with wife. He complained of pain around his previous parotid gland adenocaricoma region, he has scar tissue, as gland nodes were removed about 6 years ago. Not sure how long he has been complaining of the pain, wife has not noticed any swelling  Continues to get cyst on back, chronic tenderness all over back, has seen dermatology, surgeon   Has increaed swelling in his legs, Right foot so swollen that podiatrist cant get him new shoes, also has fungus on feet, has sore spot on toe podiatrist has been watching    Review Of Systems:  GEN- denies fatigue, fever, weight loss,weakness, recent illness HEENT- denies eye drainage, change in vision, nasal discharge, CVS- denies chest pain, palpitations RESP- denies SOB, cough, wheeze ABD- denies N/V, change in stools, abd pain GU- denies dysuria, hematuria, dribbling, incontinence MSK- +joint pain, muscle aches, injury Neuro- denies headache, dizziness, syncope, seizure activity       Objective:    BP 130/74   Pulse 70   Temp 98.1 F (36.7 C) (Oral)   Resp 16   SpO2 98%  GEN- NAD, alert and oriented x3, sitting in wheelchair, right hemiplegia HEENT- PERRL, EOMI, non injected sclera, pink conjunctiva, MMM, oropharynx clear Neck- Supple, no thryomegaly, no LAD, mild TTP over scar in crease of neck right side CVS- RRR, no murmur RESP-CTAB ABD-NABS,soft, mild TTP RUQ, no rebound, no guarding  Skin- Dry skin, multiple scarring areas, few pits from cyst   emaceration between toes, no erythema, 3rd digit callus side of nail, mild erythema  EXT- chronic non pitting edema bilat  R>l Pulses- Radial 2+ diminished         Assessment & Plan:      Problem List Items Addressed  This Visit      Unprioritized   CKD (chronic kidney disease), stage III ([place]) - Primary   Relevant Orders   Comprehensive metabolic panel   Leg edema    He sits all day, wheelchair bound Increase lasix to 41m BID Difficult to get compression hose on      History of parotid cancer    Nothing felt in region of pain, has scar tissue, advised we can get CT scan to evaluate as he did have cancer in this area He wants to hold off, recheck next week Unclear how long actually bothering him       Other Visit Diagnoses    Onychomycosis       topical anti-fungal , no sign of bacterial infection more healed callus on side of nail   Relevant Medications   clotrimazole (LOTRIMIN) 1 % cream   Dry skin dermatitis       Eucerin mixed with TAC cream. cyst are stable no sign of infection, refilled doxy to keep on hand      Note: This dictation was prepared with Dragon dictation along with smaller phrase technology. Any transcriptional errors that result from this process are unintentional.

## 2018-02-06 NOTE — Patient Instructions (Addendum)
Increase lasix to 53m twice a day for 1 week  Fungal cream for feets  Eucerin cream for itching  F/U 1 week

## 2018-02-07 ENCOUNTER — Encounter: Payer: Self-pay | Admitting: Family Medicine

## 2018-02-07 DIAGNOSIS — Z85818 Personal history of malignant neoplasm of other sites of lip, oral cavity, and pharynx: Secondary | ICD-10-CM | POA: Insufficient documentation

## 2018-02-07 NOTE — Assessment & Plan Note (Signed)
He sits all day, wheelchair bound Increase lasix to 69m BID Difficult to get compression hose on

## 2018-02-07 NOTE — Assessment & Plan Note (Signed)
Nothing felt in region of pain, has scar tissue, advised we can get CT scan to evaluate as he did have cancer in this area He wants to hold off, recheck next week Unclear how long actually bothering him

## 2018-02-10 DIAGNOSIS — N183 Chronic kidney disease, stage 3 (moderate): Secondary | ICD-10-CM | POA: Diagnosis not present

## 2018-02-10 LAB — COMPREHENSIVE METABOLIC PANEL
AG Ratio: 1.4 (calc) (ref 1.0–2.5)
ALBUMIN MSPROF: 4 g/dL (ref 3.6–5.1)
ALT: 12 U/L (ref 9–46)
AST: 11 U/L (ref 10–35)
Alkaline phosphatase (APISO): 86 U/L (ref 40–115)
BUN/Creatinine Ratio: 18 (calc) (ref 6–22)
BUN: 25 mg/dL (ref 7–25)
CALCIUM: 10.9 mg/dL — AB (ref 8.6–10.3)
CO2: 27 mmol/L (ref 20–32)
CREATININE: 1.42 mg/dL — AB (ref 0.70–1.18)
Chloride: 105 mmol/L (ref 98–110)
Globulin: 2.8 g/dL (calc) (ref 1.9–3.7)
Glucose, Bld: 153 mg/dL — ABNORMAL HIGH (ref 65–139)
POTASSIUM: 4.8 mmol/L (ref 3.5–5.3)
Sodium: 141 mmol/L (ref 135–146)
TOTAL PROTEIN: 6.8 g/dL (ref 6.1–8.1)
Total Bilirubin: 0.2 mg/dL (ref 0.2–1.2)

## 2018-02-13 ENCOUNTER — Other Ambulatory Visit: Payer: Self-pay

## 2018-02-13 ENCOUNTER — Ambulatory Visit (INDEPENDENT_AMBULATORY_CARE_PROVIDER_SITE_OTHER): Payer: Medicare Other | Admitting: Family Medicine

## 2018-02-13 ENCOUNTER — Encounter: Payer: Self-pay | Admitting: Family Medicine

## 2018-02-13 VITALS — BP 128/76 | HR 98 | Temp 98.0°F | Resp 18

## 2018-02-13 DIAGNOSIS — R51 Headache: Secondary | ICD-10-CM | POA: Diagnosis not present

## 2018-02-13 DIAGNOSIS — M542 Cervicalgia: Secondary | ICD-10-CM

## 2018-02-13 DIAGNOSIS — R600 Localized edema: Secondary | ICD-10-CM

## 2018-02-13 DIAGNOSIS — N183 Chronic kidney disease, stage 3 unspecified: Secondary | ICD-10-CM

## 2018-02-13 DIAGNOSIS — Z85818 Personal history of malignant neoplasm of other sites of lip, oral cavity, and pharynx: Secondary | ICD-10-CM

## 2018-02-13 DIAGNOSIS — R519 Headache, unspecified: Secondary | ICD-10-CM

## 2018-02-13 MED ORDER — OXYCODONE-ACETAMINOPHEN 7.5-325 MG PO TABS: 1.0000 | ORAL_TABLET | ORAL | 0 refills | Status: DC | PRN

## 2018-02-13 NOTE — Progress Notes (Signed)
   Subjective:    Patient ID: [name], male    DOB: 06/02/1946, 72 y.o.   MRN: [id number]  Patient presents for Follow-up (LE Edema) and Abscess (has area starting to R side of face under ear)  She here for interim follow-up.  Seen last week at increased edema in his lower extremities he does sit in a wheelchair all day long.  I increase his Lasix to twice a day dosing dates that his legs are improved and the swelling has gone down his wife also agrees with this.   However continued to have pain near his previous parotic cancer removal.  There is no abscess or anything swollen that is, but he still has a lot of pain in the area.  He would like to proceed with getting the imaging.  Did have some more drainage from the cyst on his back wife states that one looked more like pus so she went ahead and started the antibiotics 4 days ago.   Review Of Systems:  GEN- denies fatigue, fever, weight loss,weakness, recent illness HEENT- denies eye drainage, change in vision, nasal discharge, CVS- denies chest pain, palpitations RESP- denies SOB, cough, wheeze ABD- denies N/V, change in stools, abd pain GU- denies dysuria, hematuria, dribbling, incontinence MSK-+ joint pain, muscle aches, injury Neuro- denies headache, dizziness, syncope, seizure activity       Objective:    BP 128/76   Pulse 98   Temp 98 F (36.7 C) (Oral)   Resp 18   SpO2 98%  GEN- NAD, alert and oriented x3, sitting in wheelchair, right hemiplegia HEENT- PERRL, EOMI, non injected sclera, pink conjunctiva, MMM, oropharynx clear Neck- Supple, no thryomegaly, no LAD, mild TTP over scar in crease of neck right side CVS- RRR, no murmur RESP-CTAB EXT- chronic non pitting edema bilat  R>l improved swelling  Pulses- Radial 2+ diminished       Assessment & Plan:      Problem List Items Addressed This Visit      Unprioritized   CKD (chronic kidney disease), stage III ([facility]) - Primary   Relevant Orders   Basic  metabolic panel (Completed)   Leg edema    Edema has improved.  His renal function is rechecked today to make sure it is tolerated twice a day Lasix dosing him to continue him on this for now.      History of parotid cancer    Concerned about his pain in the same region as his previous parotid cancer coronary artery not feel any abnormal lymph nodes or swelling.  With all of the scar tissue I think this can be difficult to tease out think he needs CT of his neck         Note: This dictation was prepared with Dragon dictation along with smaller phrase technology. Any transcriptional errors that result from this process are unintentional.

## 2018-02-13 NOTE — Patient Instructions (Addendum)
F/U 3 months  CT of neck to be done  Continue lasix Complete antibiotics

## 2018-02-14 ENCOUNTER — Encounter: Payer: Self-pay | Admitting: Family Medicine

## 2018-02-14 DIAGNOSIS — N183 Chronic kidney disease, stage 3 (moderate): Secondary | ICD-10-CM | POA: Diagnosis not present

## 2018-02-14 LAB — BASIC METABOLIC PANEL
BUN / CREAT RATIO: 14 (calc) (ref 6–22)
BUN: 18 mg/dL (ref 7–25)
CO2: 30 mmol/L (ref 20–32)
CREATININE: 1.32 mg/dL — AB (ref 0.70–1.18)
Calcium: 10.8 mg/dL — ABNORMAL HIGH (ref 8.6–10.3)
Chloride: 101 mmol/L (ref 98–110)
Glucose, Bld: 84 mg/dL (ref 65–99)
POTASSIUM: 4.3 mmol/L (ref 3.5–5.3)
SODIUM: 140 mmol/L (ref 135–146)

## 2018-02-14 NOTE — Assessment & Plan Note (Signed)
Concerned about his pain in the same region as his previous parotid cancer coronary artery not feel any abnormal lymph nodes or swelling.  With all of the scar tissue I think this can be difficult to tease out think he needs CT of his neck

## 2018-02-14 NOTE — Assessment & Plan Note (Signed)
Edema has improved.  His renal function is rechecked today to make sure it is tolerated twice a day Lasix dosing him to continue him on this for now.

## 2018-02-19 ENCOUNTER — Other Ambulatory Visit: Payer: Self-pay | Admitting: Family Medicine

## 2018-02-20 NOTE — Telephone Encounter (Signed)
Ok to refill zanaflex?

## 2018-02-28 ENCOUNTER — Other Ambulatory Visit: Payer: Self-pay | Admitting: Family Medicine

## 2018-02-28 ENCOUNTER — Ambulatory Visit (HOSPITAL_COMMUNITY)
Admission: RE | Admit: 2018-02-28 | Discharge: 2018-02-28 | Disposition: A | Discharge status: Home / Self Care | Payer: Medicare Other | Source: Ambulatory Visit | Attending: Family Medicine | Admitting: Family Medicine

## 2018-02-28 DIAGNOSIS — I700 Atherosclerosis of aorta: Secondary | ICD-10-CM | POA: Insufficient documentation

## 2018-02-28 DIAGNOSIS — R911 Solitary pulmonary nodule: Secondary | ICD-10-CM | POA: Diagnosis not present

## 2018-02-28 DIAGNOSIS — J439 Emphysema, unspecified: Secondary | ICD-10-CM | POA: Diagnosis not present

## 2018-02-28 DIAGNOSIS — M542 Cervicalgia: Secondary | ICD-10-CM

## 2018-02-28 DIAGNOSIS — L723 Sebaceous cyst: Secondary | ICD-10-CM | POA: Diagnosis not present

## 2018-02-28 DIAGNOSIS — R51 Headache: Secondary | ICD-10-CM

## 2018-02-28 DIAGNOSIS — R918 Other nonspecific abnormal finding of lung field: Secondary | ICD-10-CM

## 2018-02-28 DIAGNOSIS — R600 Localized edema: Secondary | ICD-10-CM

## 2018-02-28 DIAGNOSIS — Z85818 Personal history of malignant neoplasm of other sites of lip, oral cavity, and pharynx: Secondary | ICD-10-CM

## 2018-02-28 DIAGNOSIS — R519 Headache, unspecified: Secondary | ICD-10-CM

## 2018-02-28 DIAGNOSIS — E890 Postprocedural hypothyroidism: Secondary | ICD-10-CM | POA: Diagnosis not present

## 2018-02-28 MED ORDER — IOPAMIDOL (ISOVUE-300) INJECTION 61%: 75.0000 mL | Freq: Once | INTRAVENOUS | Status: DC | PRN

## 2018-02-28 NOTE — Progress Notes (Signed)
Spoke with radiology, based on appearance, recommend PET scan Concerning for primary lung cancer

## 2018-03-02 ENCOUNTER — Other Ambulatory Visit: Payer: Self-pay | Admitting: Family Medicine

## 2018-03-05 ENCOUNTER — Other Ambulatory Visit: Payer: Self-pay | Admitting: "Endocrinology

## 2018-03-11 ENCOUNTER — Other Ambulatory Visit: Payer: Self-pay | Admitting: Family Medicine

## 2018-03-12 ENCOUNTER — Encounter (HOSPITAL_COMMUNITY)
Admission: RE | Admit: 2018-03-12 | Discharge: 2018-03-12 | Disposition: A | Discharge status: Home / Self Care | Payer: Medicare Other | Source: Ambulatory Visit | Attending: Family Medicine | Admitting: Family Medicine

## 2018-03-12 ENCOUNTER — Other Ambulatory Visit: Payer: Self-pay | Admitting: Family Medicine

## 2018-03-12 DIAGNOSIS — R918 Other nonspecific abnormal finding of lung field: Secondary | ICD-10-CM | POA: Insufficient documentation

## 2018-03-14 ENCOUNTER — Other Ambulatory Visit: Payer: Self-pay | Admitting: Family Medicine

## 2018-03-21 ENCOUNTER — Ambulatory Visit (HOSPITAL_COMMUNITY)
Admission: RE | Admit: 2018-03-21 | Discharge: 2018-03-21 | Disposition: A | Discharge status: Home / Self Care | Payer: Medicare Other | Source: Ambulatory Visit | Attending: Family Medicine | Admitting: Family Medicine

## 2018-03-21 ENCOUNTER — Other Ambulatory Visit: Payer: Self-pay | Admitting: Family Medicine

## 2018-03-21 ENCOUNTER — Encounter (HOSPITAL_COMMUNITY): Payer: Self-pay

## 2018-03-21 ENCOUNTER — Telehealth: Payer: Self-pay | Admitting: Family Medicine

## 2018-03-21 DIAGNOSIS — R918 Other nonspecific abnormal finding of lung field: Secondary | ICD-10-CM

## 2018-03-21 DIAGNOSIS — C799 Secondary malignant neoplasm of unspecified site: Secondary | ICD-10-CM

## 2018-03-21 DIAGNOSIS — J439 Emphysema, unspecified: Secondary | ICD-10-CM | POA: Insufficient documentation

## 2018-03-21 DIAGNOSIS — C3412 Malignant neoplasm of upper lobe, left bronchus or lung: Secondary | ICD-10-CM

## 2018-03-21 LAB — GLUCOSE, CAPILLARY: Glucose-Capillary: 120 mg/dL — ABNORMAL HIGH (ref 65–99)

## 2018-03-21 IMAGING — PT NM PET TUM IMG INITIAL (PI) SKULL BASE T - THIGH
2 series · 8 of 8 positions shown · non-contrast
Comparison: Neck CT 02/28/2018

CLINICAL DATA: Initial treatment strategy for left upper lobe lung
mass.

EXAM:
NUCLEAR MEDICINE PET SKULL BASE TO THIGH
TECHNIQUE: 13.4 mCi F-18 FDG was injected intravenously. Full-ring PET imaging
was performed from the skull base to thigh after the radiotracer. CT
data was obtained and used for attenuation correction and anatomic
localization.
Fasting blood glucose: 120 mg/dl

[Series 1079: results mm oncology reading · 1.2mm · 1.20mm/px · 4 of 4 slices shown (1 of 2)]
[im 1/4]
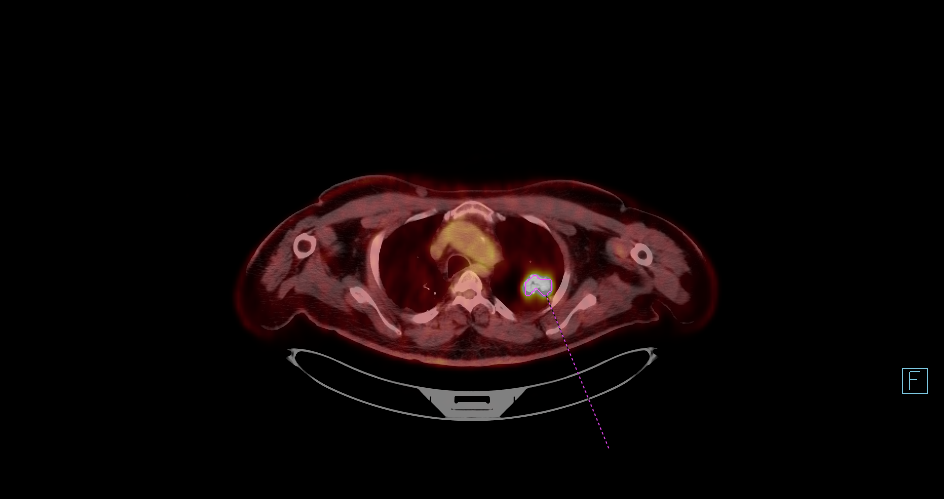
[im 2/4]
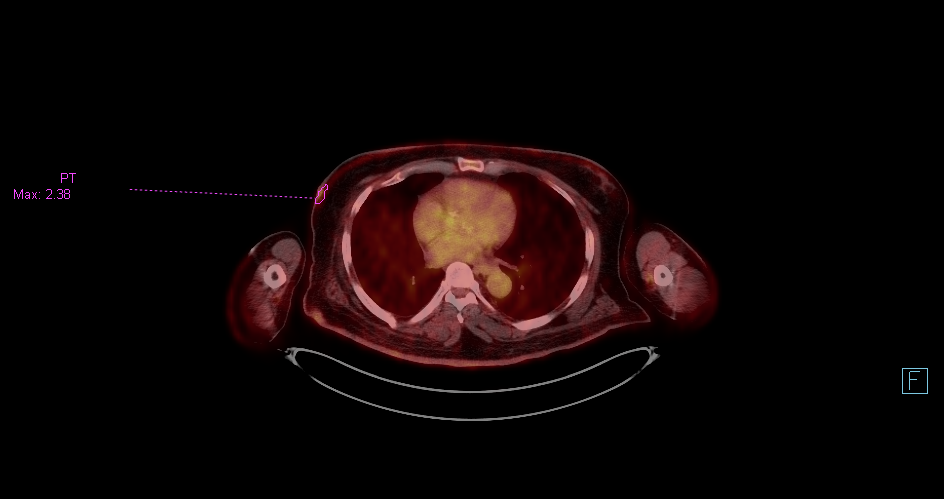
[im 3/4]
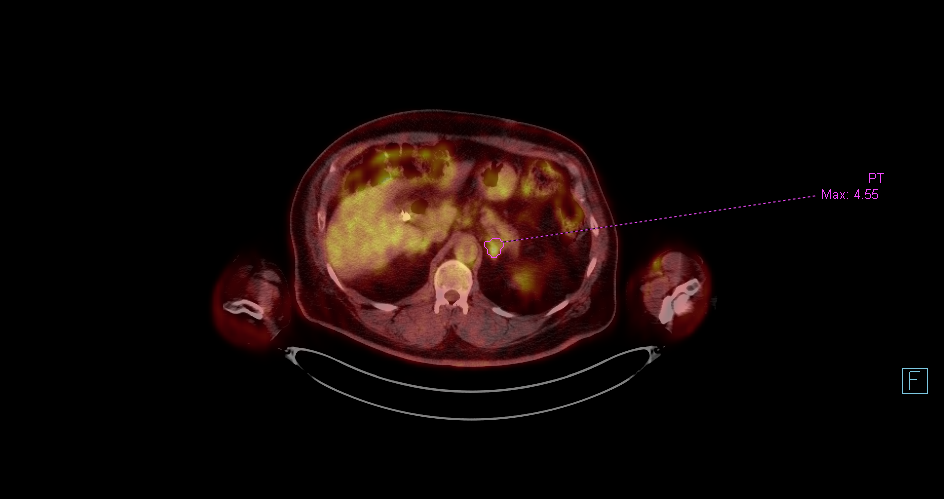
[im 4/4]
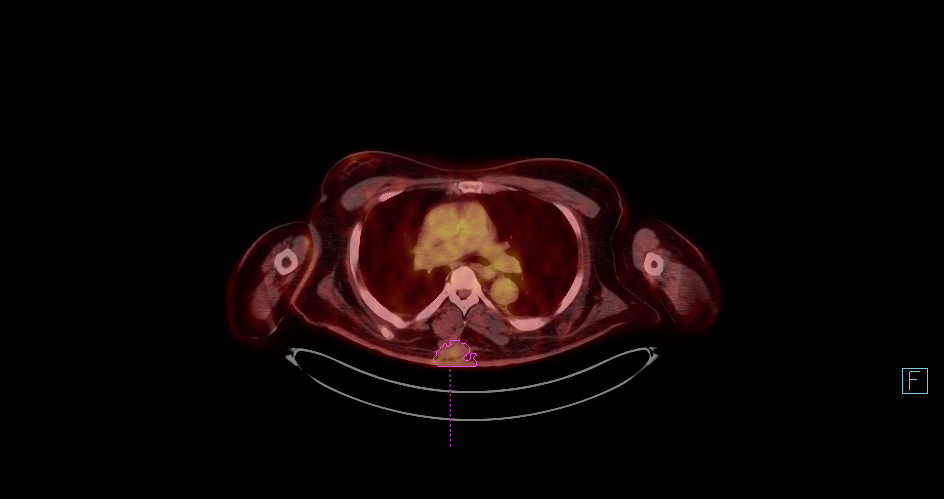

[Series 1222: results mm oncology reading · 5.0mm · 0.92mm/px · 4 of 4 slices shown (2 of 2)]
[im 1/4]
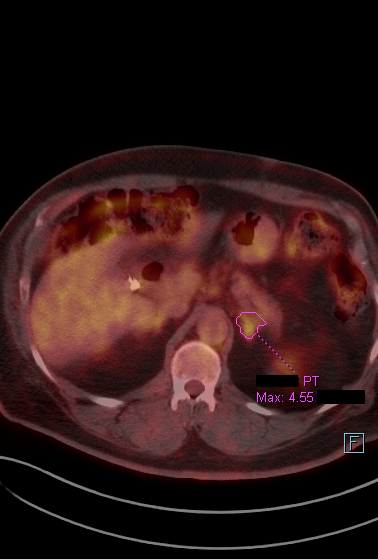
[im 2/4]
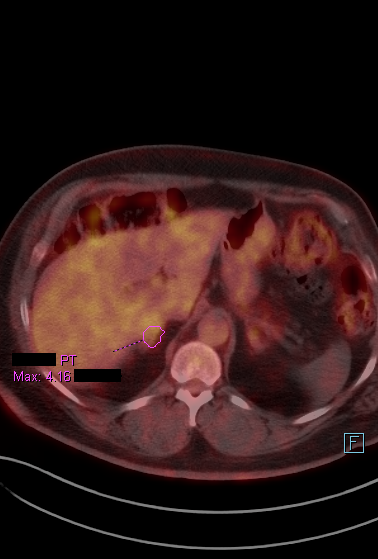
[im 3/4]
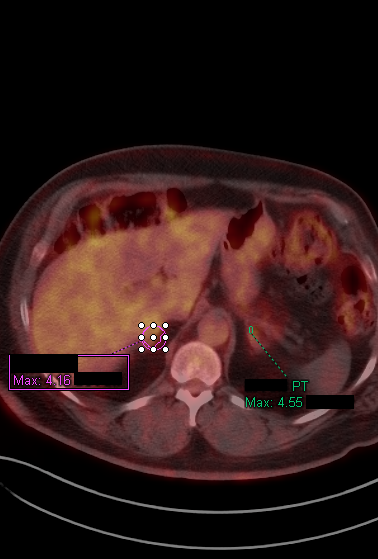
[im 4/4]
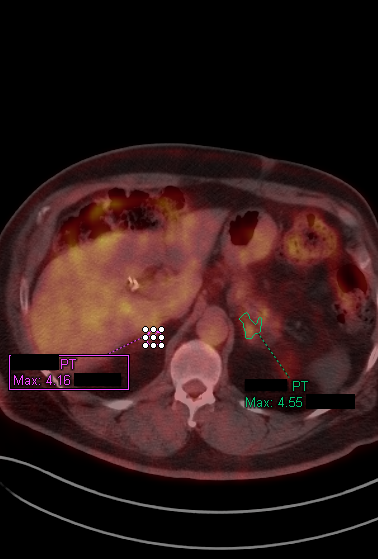

[8 of 8 positions shown; findings below may reference images not displayed]

FINDINGS: Mediastinal blood pool activity: SUV max

NECK: No hypermetabolic lymph nodes in the neck.

Incidental CT findings: Simple appearing cyst superficial to the
left parotid gland.

CHEST: The lobulated 3.6 cm left upper lobe lung mass is
hypermetabolic with SUV max of 14.75 and consistent with neoplasm.

No enlarged or hypermetabolic mediastinal/hilar lymph nodes.

Incidental CT findings: No other pulmonary nodules are identified.
Surgical scarring changes noted in the right upper lobe and there
are emphysematous changes.

ABDOMEN/PELVIS: 9 mm left adrenal gland nodule is mildly
hypermetabolic with SUV max of 4.55. Could not exclude a metastatic
focus.

No hepatic lesions and no abdominal/pelvic lymphadenopathy.

Incidental CT findings: Advanced atherosclerotic calcifications
involving the aorta and iliac arteries. Multiple bilateral renal
cysts are noted.

SKELETON: There is a small skin lesion along the lateral aspect of
the right breast which is mildly hypermetabolic with SUV max of
2.38. Recommend visual inspection. No findings suspicious for
osseous metastatic disease. Marked arthropathic changes involving
the left shoulder with some hypermetabolism.

Incidental CT findings: none
IMPRESSION: 1. 3.6 cm lobulated left upper lobe lung mass is hypermetabolic and
consistent with neoplasm. No mediastinal or hilar adenopathy.
2. 9 mm left adrenal gland nodule is mildly hypermetabolic and could
represent a metastatic focus. Close surveillance is suggested.
3. No other findings for metastatic disease.
4. Small hypermetabolic skin lesion involving the lateral aspect of
the right breast. Recommend correlation with clinical exam.
5. Emphysematous changes and pulmonary scarring.

## 2018-03-21 MED ORDER — FLUDEOXYGLUCOSE F - 18 (FDG) INJECTION
13.4000 | Freq: Once | INTRAVENOUS | Status: AC | PRN
  Administered 2018-03-21: 13.4 via INTRAVENOUS

## 2018-03-21 NOTE — Telephone Encounter (Signed)
Spoke with patient and his wife Given results of his PET scan was concerning for neoplasm of the lung with possible metastasis to his adrenal gland.  He is in agreement with going for testing and only speaking to the oncologist.  He is not sure about treatment is because of his state of health.  We will go ahead and set up the interdisciplinary clinic for the lung cancer.

## 2018-03-22 ENCOUNTER — Telehealth: Payer: Self-pay | Admitting: Internal Medicine

## 2018-03-22 NOTE — Telephone Encounter (Signed)
Appointment scheduled left message on voicemail

## 2018-03-23 ENCOUNTER — Telehealth: Payer: Self-pay | Admitting: Family Medicine

## 2018-03-23 NOTE — Telephone Encounter (Signed)
Called patient and informed him that we referred him to [facility] and he is scheduled to see them on Tuesday 03/27/18 at 2:15 pm. Patient verbalized understanding.

## 2018-03-27 ENCOUNTER — Telehealth: Payer: Self-pay | Admitting: Internal Medicine

## 2018-03-27 ENCOUNTER — Encounter: Payer: Self-pay | Admitting: Internal Medicine

## 2018-03-27 ENCOUNTER — Inpatient Hospital Stay: Payer: Medicare Other | Attending: Internal Medicine | Admitting: Internal Medicine

## 2018-03-27 VITALS — BP 136/77 | HR 77 | Temp 98.3°F | Resp 17 | Ht 72.0 in | Wt 267.7 lb

## 2018-03-27 DIAGNOSIS — R918 Other nonspecific abnormal finding of lung field: Secondary | ICD-10-CM | POA: Diagnosis not present

## 2018-03-27 DIAGNOSIS — Z87891 Personal history of nicotine dependence: Secondary | ICD-10-CM

## 2018-03-27 DIAGNOSIS — I10 Essential (primary) hypertension: Secondary | ICD-10-CM

## 2018-03-27 DIAGNOSIS — C3490 Malignant neoplasm of unspecified part of unspecified bronchus or lung: Secondary | ICD-10-CM

## 2018-03-27 DIAGNOSIS — M109 Gout, unspecified: Secondary | ICD-10-CM

## 2018-03-27 DIAGNOSIS — Z85858 Personal history of malignant neoplasm of other endocrine glands: Secondary | ICD-10-CM

## 2018-03-27 DIAGNOSIS — C07 Malignant neoplasm of parotid gland: Secondary | ICD-10-CM

## 2018-03-27 DIAGNOSIS — E279 Disorder of adrenal gland, unspecified: Secondary | ICD-10-CM | POA: Diagnosis not present

## 2018-03-27 DIAGNOSIS — Z8673 Personal history of transient ischemic attack (TIA), and cerebral infarction without residual deficits: Secondary | ICD-10-CM | POA: Diagnosis not present

## 2018-03-27 DIAGNOSIS — E119 Type 2 diabetes mellitus without complications: Secondary | ICD-10-CM | POA: Diagnosis not present

## 2018-03-27 NOTE — Telephone Encounter (Signed)
Scheduled appt per 4/16 los - Gave patient AVS and calender per los. - Central radiology to contact patient for CT biopsy and radiation oncology to contact patient .

## 2018-03-27 NOTE — Progress Notes (Signed)
[place] [phone]   [phone]  CONSULT NOTE  REFERRING PHYSICIAN: Dr. Vic Blackbird  REASON FOR CONSULTATION:  72 years old African-American male with suspicious lung cancer.  HPI [name] is a 72 y.o. 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  The patient also had wedge resection of the right upper lobe bullae on January 25, 2010 under the care of Dr. Arlyce Dice.  He was seen recently by his primary care physician complaining of pain on the right side of the face near the ear.  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  There was partially visualized 4.0 cm left upper lobe lung mass concerning for primary lung malignancy.  A PET scan was performed on 03/21/2018 and that showed lobulated 3.6 cm left upper lobe lung mass that was hypermetabolic with SUV max of 80.16 consistent with neoplasm.  There was no enlarged or hypermetabolic mediastinal/hilar lymph nodes.  Scan of the abdomen showed 0.9 cm left adrenal gland nodules that was mildly hypermetabolic with SUV max of 5.53.  Could not exclude a metastatic focus. The patient was referred to me today for further evaluation and recommendation regarding his condition.  When seen today he continues to complain of pain on the right side of the face but no significant chest pain, shortness breath, cough or hemoptysis.  He denied having any recent weight loss or night sweats.  He has no nausea, vomiting, diarrhea or constipation.  He continues to have headaches. Family history significant for mother and father with heart  disease. The patient is married and has 1 living son.  He was accompanied today by his wife [name] and his son Lennette Bihari.  The patient he was to work in [employer].  He has a history for smoking for around 30 years but quit in 1998.  Has no history of alcohol or drug abuse.  HPI  Past Medical History:  Diagnosis Date  . Arthritis   . Asthma    "hx of"  . Barrett's esophagus    EGD 03/23/2011 & EGD 2/09 bx proven  . BPH (benign prostatic hyperplasia)   . Cancer of parotid gland ([place]) 11/23/12   Adenocarcinoma  . Chronic abdominal pain   . Colon polyp 03/23/2011   tubular adenoma, Dr. Gala Romney  . 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 ([place]) 07/15/2011  . CVA (cerebral infarction) 1998   right sided deficit  . Diverticulosis    TCS 03/23/11 pancolonic diverticula &TCS 5/08, pancolonic diverticula  . DM (diabetes mellitus) ([place])   . Edema of lower extremity 12/21/12   bilateral   . Esotropia of left eye   . GERD (gastroesophageal reflux disease)   . Glaucoma (increased eye pressure)   . Gout   . Gout   . Hemorrhagic colitis 06/06/2012.  [name] Hemorrhoids, internal 03/23/2011   tcs by Dr. Gala Romney  . Hepatitis    esosiniphilic, tx with prednisone  . Hiatal hernia   . HTN (hypertension)   . Hx of radiation therapy 1974   right base of skull area-lymphoma  . Hyperlipidemia   . Lower facial weakness    Right  .  Lymphoma ([place]) 1974   XRT at [facility], right base of skull area  . Neuropathy   . Renal insufficiency   . Steatohepatitis    liver biopsy 2009  . Stroke [facility] 1998   right hemiparesis/plegia    Past Surgical History:  Procedure Laterality Date  . BIOPSY  12/01/2016   Procedure: BIOPSY;  Surgeon: Daneil Dolin, MD;  Location: [facility];  Service: Endoscopy;;  duodenum, gastric, esophagus  . CHOLECYSTECTOMY    . COLONOSCOPY  03/23/11   Dr. Gala Romney  pancolonic diverticula, hemorrhoids, tubular adenoma.. next tcs 03/2016  . COLONOSCOPY WITH PROPOFOL N/A 12/01/2016    inadequate bowel prep precluded exam  . ESOPHAGOGASTRODUODENOSCOPY  02/05/08   goblet cell metaplasia/negative for H.pylori  . ESOPHAGOGASTRODUODENOSCOPY  03/23/11   Dr. Gala Romney, barretts, hiatal hernia  . ESOPHAGOGASTRODUODENOSCOPY (EGD) WITH PROPOFOL N/A 12/01/2016   Dr. Gala Romney: Large amount of retained gastric contents precluded completion of the stomach. Mucosal changes were found in the stomach. Erosions and somewhat scalloped appearing mucosa present, reactive gastritis/no H pylori. Barrett's esophagus noted, no dysplasia on biopsy. Duodenal biopsies taken as well, benign, no evidence of eosinophilia.  [name] MASS BIOPSY  11/01/2012   Procedure: NECK MASS BIOPSY;  Surgeon: Ascencion Dike, MD;  Location: [facility] ORS;  Service: ENT;  Laterality: Right;  Excisional Bx Right Neck Mass; attempted external jugular cutdown of left side  . PAROTIDECTOMY  11/24/2012   Procedure: PAROTIDECTOMY;  Surgeon: Ascencion Dike, MD;  Location: [place];  Service: ENT;  Laterality: N/A;  Total parotidectomy  . PLEURECTOMY    . right lymph node removal Right    behind right ear  . Right video-assisted thoracic surgery, pleurectomy, and pleurodesis  2011    Family History  Problem Relation Age of Onset  . Heart failure Mother   . Heart failure Father   . Heart failure Sister   . Heart failure Son     Social History Social History   Tobacco Use  . Smoking status: Former Smoker    Packs/day: 3.00    Years: 25.00    Pack years: 75.00    Types: Cigarettes    Last attempt to quit: 03/01/1997    Years since quitting: 21.0  . Smokeless tobacco: Never Used  Substance Use Topics  . Alcohol use: No  . Drug use: No    No Known Allergies  Current Outpatient Medications  Medication Sig Dispense Refill  . allopurinol (ZYLOPRIM) 300 MG tablet TAKE 1 TABLET BY MOUTH EVERY DAY 90 tablet 0  . amLODipine (NORVASC) 10 MG tablet TAKE 1 TABLET BY MOUTH DAILY 90 tablet 0  . cloNIDine (CATAPRES) 0.2 MG tablet TAKE 1 TABLET BY MOUTH  THREE TIMES DAILY 270 tablet 0  . clotrimazole (LOTRIMIN) 1 % cream Apply 1 application topically 2 (two) times daily. 30 g 0  . clotrimazole-betamethasone (LOTRISONE) cream APPLY EXTERNALLY TO THE AFFECTED AREA TWICE DAILY 45 g 0  . furosemide (LASIX) 40 MG tablet Take 1 tablet (40 mg total) by mouth daily. 90 tablet 3  . gabapentin (NEURONTIN) 300 MG capsule TAKE 2 CAPSULES(600 MG) BY MOUTH THREE TIMES DAILY 540 capsule 0  . Insulin Glargine (LANTUS SOLOSTAR) 100 UNIT/ML Solostar Pen Inject 50 Units into the skin at bedtime. (Patient taking differently: Inject 55 Units into the skin at bedtime. ) 15 mL 2  . levothyroxine (SYNTHROID, LEVOTHROID) 88 MCG tablet TAKE 1 TABLET(88 MCG) BY MOUTH DAILY 30 tablet 2  . lisinopril (PRINIVIL,ZESTRIL) 20 MG tablet  TAKE 1 TABLET BY MOUTH DAILY 90 tablet 0  . lubiprostone (AMITIZA) 24 MCG capsule Take 1 capsule (24 mcg total) by mouth 2 (two) times daily with a meal. 60 capsule 11  . metFORMIN (GLUCOPHAGE) 500 MG tablet TAKE 1 TABLET BY MOUTH TWICE DAILY 180 tablet 0  . metoprolol succinate (TOPROL-XL) 50 MG 24 hr tablet TAKE 1 TABLET BY MOUTH DAILY WITH OR IMMEDIATELY FOLLOWING A MEAL 90 tablet 0  . ONETOUCH DELICA LANCETS FINE MISC USE AS DIRECTED FOUR TIMES DAILY 200 each 2  . ONETOUCH VERIO test strip TEST FOUR TIMES DAILY 150 each 5  . oxyCODONE-acetaminophen (PERCOCET) 7.5-325 MG tablet Take 1 tablet by mouth every 4 (four) hours as needed. 180 tablet 0  . pantoprazole (PROTONIX) 40 MG tablet TAKE 1 TABLET BY MOUTH DAILY 90 tablet 0  . potassium chloride SA (K-DUR,KLOR-CON) 20 MEQ tablet TAKE 1 TABLET(20 MEQ) BY MOUTH DAILY 30 tablet 0  . simvastatin (ZOCOR) 20 MG tablet TAKE 1 TABLET BY MOUTH EVERY NIGHT AT BEDTIME 90 tablet 0  . tamsulosin (FLOMAX) 0.4 MG CAPS capsule TAKE 1 CAPSULE BY MOUTH DAILY 90 capsule 0  . tiZANidine (ZANAFLEX) 2 MG tablet TAKE 1 TABLET(2 MG) BY MOUTH TWICE DAILY AS NEEDED 180 tablet 2  . Triamcinolone Acetonide (TRIAMCINOLONE  0.1 % CREAM : EUCERIN) CREA Apply 1 application topically 2 (two) times daily as needed. 1 each 2   No current facility-administered medications for this visit.     Review of Systems  Constitutional: positive for fatigue Eyes: negative Ears, nose, mouth, throat, and face: positive for Soreness in the right side of the face. Respiratory: negative Cardiovascular: negative Gastrointestinal: negative Genitourinary:negative Integument/breast: negative Hematologic/lymphatic: negative Musculoskeletal:negative Neurological: negative Behavioral/Psych: negative Endocrine: negative Allergic/Immunologic: negative  Physical Exam  MEQ:ASTMH, healthy, no distress, well nourished and well developed SKIN: skin color, texture, turgor are normal, no rashes or significant lesions HEAD: Normocephalic, No masses, lesions, tenderness or abnormalities EYES: normal, PERRLA, Conjunctiva are pink and non-injected EARS: External ears normal, Canals clear OROPHARYNX:no exudate, no erythema and lips, buccal mucosa, and tongue normal  NECK: supple, no adenopathy, no JVD LYMPH:  no palpable lymphadenopathy, no hepatosplenomegaly LUNGS: clear to auscultation , and palpation HEART: regular rate & rhythm, no murmurs and no gallops ABDOMEN:abdomen soft, non-tender, normal bowel sounds and no masses or organomegaly BACK: Back symmetric, no curvature., No CVA tenderness EXTREMITIES:no joint deformities, effusion, or inflammation, no edema  NEURO: alert & oriented x 3   PERFORMANCE STATUS: ECOG 2  LABORATORY DATA: Lab Results  Component Value Date   WBC 8.9 11/01/2017   HGB 13.5 11/01/2017   HCT 43.0 11/01/2017   MCV 74.4 (L) 11/01/2017   PLT 275 11/01/2017      Chemistry      Component Value Date/Time   NA 140 02/14/2018 0835   NA 138 08/16/2011 0957   K 4.3 02/14/2018 0835   K 4.3 08/16/2011 0957   CL 101 02/14/2018 0835   CO2 30 02/14/2018 0835   BUN 18 02/14/2018 0835   BUN 34 (A)  01/25/2012   BUN 23 08/16/2011 0957   CREATININE 1.32 (H) 02/14/2018 0835      Component Value Date/Time   CALCIUM 10.8 (H) 02/14/2018 0835   CALCIUM 10.3 08/16/2011 0957   CALCIUM 11.4 (H) 08/12/2011 1349   ALKPHOS 82 05/04/2017 0931   ALKPHOS 80 08/16/2011 0957   AST 11 02/10/2018 1100   AST 14 08/16/2011 0957   ALT 12 02/10/2018 1100  BILITOT 0.2 02/10/2018 1100   BILITOT 0.4 07/30/2011 1546       RADIOGRAPHIC STUDIES: Ct Soft Tissue Neck Wo Contrast  Result Date: 02/28/2018 CLINICAL DATA:  Right-sided facial pain near the ear. History of parotid malignancy. History of lymphoma. EXAM: CT NECK WITHOUT CONTRAST TECHNIQUE: Multidetector CT imaging of the neck was performed following the standard protocol without intravenous contrast. COMPARISON:  11/12/2012 FINDINGS: IV access could not be obtained, and contrast was not administered. This reduces sensitivity for detection of pathology including recurrent malignancy. Pharynx and larynx: No evidence of pharyngeal mass or swelling. Small volume secretions in the lower pharynx and subglottic larynx/trachea. Salivary glands: Postsurgical changes from interval partial right parotidectomy for resection of the parotid tail mass on the prior CT. There is a soft tissue defect in this region with skin thickening which extends to the superficial aspect of the sternocleidomastoid muscle. No discrete soft tissue mass is identified within limitations of noncontrast technique, although the skin thickening appears slightly nodular superiorly. Residual right parotid gland tissue is unremarkable aside from an unchanged punctate calcification anteriorly. A 1.8 x 1.4 cm well-circumscribed low-density mass in the subcutaneous tissues overlying the left parotid gland is unchanged and likely represents a sebaceous cyst. The left parotid gland itself is unremarkable, as are the submandibular glands. Thyroid: Unchanged subcentimeter low-density right thyroid nodule.  Lymph nodes: No enlarged or suspicious lymph nodes in the neck. Vascular: Extensive right and moderate left carotid bifurcation calcified atherosclerotic plaque. Aortic arch atherosclerosis. Limited intracranial: Unremarkable. Visualized orbits: Right cataract extraction. Mastoids and visualized paranasal sinuses: Mild right maxillary sinus mucosal thickening. Clear mastoid air cells. Skeleton: Advanced diffuse cervical disc degeneration with multilevel degenerative vertebral fusion. Facet arthrosis most severe on the right at C2-3 where there is unchanged slight anterolisthesis. Unchanged trace anterolisthesis of C7 on T1. Unchanged subchondral cyst or erosion involving the right T1 inferior articular process. Upper chest: Emphysema in the lung apices with postsurgical changes on the right. Partially visualized 4.0 x 2.9 cm mass in the apicoposterior left upper lobe, greatly enlarged from the prior study where there was only a small amount of nodular opacity. Other: None. IMPRESSION: 1. Interval partial right parotidectomy. Skin thickening in this area without definite recurrent mass lesion on this unenhanced examination (IV access could not be obtained). No cervical lymphadenopathy. 2. Partially visualized 4 cm left upper lobe lung mass concerning for primary lung malignancy. Consider PET-CT for further evaluation. 3. Unchanged sebaceous cyst overlying the left parotid gland. 4. Aortic Atherosclerosis (ICD10-I70.0) and Emphysema (ICD10-J43.9). Electronically Signed   By: Logan Bores M.D.   On: 02/28/2018 11:51   Nm Pet Image Initial (pi) Skull Base To Thigh  Result Date: 03/21/2018 CLINICAL DATA:  Initial treatment strategy for left upper lobe lung mass. EXAM: NUCLEAR MEDICINE PET SKULL BASE TO THIGH TECHNIQUE: 13.4 mCi F-18 FDG was injected intravenously. Full-ring PET imaging was performed from the skull base to thigh after the radiotracer. CT data was obtained and used for attenuation correction and  anatomic localization. Fasting blood glucose: 120 mg/dl COMPARISON:  Neck CT 02/28/2018 FINDINGS: Mediastinal blood pool activity: SUV max 3.09 NECK: No hypermetabolic lymph nodes in the neck. Incidental CT findings: Simple appearing cyst superficial to the left parotid gland. CHEST: The lobulated 3.6 cm left upper lobe lung mass is hypermetabolic with SUV max of 02.77 and consistent with neoplasm. No enlarged or hypermetabolic mediastinal/hilar lymph nodes. Incidental CT findings: No other pulmonary nodules are identified. Surgical scarring changes noted in the right upper  lobe and there are emphysematous changes. ABDOMEN/PELVIS: 9 mm left adrenal gland nodule is mildly hypermetabolic with SUV max of 1.06. Could not exclude a metastatic focus. No hepatic lesions and no abdominal/pelvic lymphadenopathy. Incidental CT findings: Advanced atherosclerotic calcifications involving the aorta and iliac arteries. Multiple bilateral renal cysts are noted. SKELETON: There is a small skin lesion along the lateral aspect of the right breast which is mildly hypermetabolic with SUV max of 2.69. Recommend visual inspection. No findings suspicious for osseous metastatic disease. Marked arthropathic changes involving the left shoulder with some hypermetabolism. Incidental CT findings: none IMPRESSION: 1. 3.6 cm lobulated left upper lobe lung mass is hypermetabolic and consistent with neoplasm. No mediastinal or hilar adenopathy. 2. 9 mm left adrenal gland nodule is mildly hypermetabolic and could represent a metastatic focus. Close surveillance is suggested. 3. No other findings for metastatic disease. 4. Small hypermetabolic skin lesion involving the lateral aspect of the right breast. Recommend correlation with clinical exam. 5. Emphysematous changes and pulmonary scarring. Electronically Signed   By: Marijo Sanes M.D.   On: 03/21/2018 15:35    ASSESSMENT: This is a very pleasant 72 years old African-American male presented  with large left upper lobe lung mass likely stage Ib lung cancer.  The patient also has a suspicious left adrenal gland nodule that is mildly hypermetabolic, nonspecific but could also represent a metastatic focus.  He has a history of right parotid gland adenocarcinoma status post partial parotidectomy.   PLAN: I had a lengthy discussion with the patient and his family today about his current disease stage, prognosis and treatment options. I personally and independently reviewed the PET scan images and discussed the results and showed the images to the patient and his family. I recommended for the patient to have a CT-guided core biopsy of the left upper lobe lung mass for confirmation of tissue diagnosis. I do not think the patient is a good surgical candidate because of his comorbidities and performance status. I would refer the patient back to Dr. Isidore Moos for consideration of curative radiotherapy to the left upper lobe lung mass after the tissue diagnosis is confirmed.  We will continue to monitor the left adrenal gland nodule closely on upcoming imaging studies. I will see him back for follow-up visit in 3 weeks for reevaluation and more detailed discussion of his treatment options. I will complete the staging work-up by ordering MRI of the brain to rule out brain metastasis. He was advised to call immediately if he has any concerning symptoms in the interval. The patient voices understanding of current disease status and treatment options and is in agreement with the current care plan.  All questions were answered. The patient knows to call the clinic with any problems, questions or concerns. We can certainly see the patient much sooner if necessary.  Thank you so much for allowing me to participate in the care of [name]. I will continue to follow up the patient with you and assist in his care.  I spent 40 minutes counseling the patient face to face. The total time spent in the  appointment was 60 minutes.  Disclaimer: This note was dictated with voice recognition software. Similar sounding words can inadvertently be transcribed and may not be corrected upon review.   Eilleen Kempf March 27, 2018, 2:50 PM

## 2018-03-28 ENCOUNTER — Encounter: Payer: Self-pay | Admitting: Radiation Oncology

## 2018-03-28 NOTE — Progress Notes (Signed)
Thoracic Location of Tumor / Histology:  Left Upper Lobe lung mass.   Patient presented months ago with symptoms of:  Dr. Julien Nordmann documents 03/27/18: He was seen recently by his primary care physician complaining of pain on the right side of the face near the ear.  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  There was partially visualized 4.0 cm left upper lobe lung mass concerning for primary lung malignancy.  Biopsies of revealed: No biopsy yet.   Tobacco/Marijuana/Snuff/ETOH use: He is a former smoker. He quit in 1998.  Past/Anticipated interventions by cardiothoracic surgery, if any:  Dr. Roxan Hockey Thursday 04/05/18  Past/Anticipated interventions by medical oncology, if any:  03/27/18 Dr. Julien Nordmann PLAN: I had a lengthy discussion with the patient and his family today about his current disease stage, prognosis and treatment options. I personally and independently reviewed the PET scan images and discussed the results and showed the images to the patient and his family. I recommended for the patient to have a CT-guided core biopsy of the left upper lobe lung mass for confirmation of tissue diagnosis. I do not think the patient is a good surgical candidate because of his comorbidities and performance status. I would refer the patient back to Dr. Isidore Moos for consideration of curative radiotherapy to the left upper lobe lung mass after the tissue diagnosis is confirmed.  We will continue to monitor the left adrenal gland nodule closely on upcoming imaging studies. I will see him back for follow-up visit in 3 weeks for reevaluation and more detailed discussion of his treatment options. I will complete the staging work-up by ordering MRI of the brain to rule out brain metastasis    =Signs/Symptoms  Weight  changes, if any: He has not lost weight.   Respiratory complaints, if any: He denies  Hemoptysis, if any: No  Pain issues, if any:  No  SAFETY ISSUES:  Prior radiation? Per Dr.Squire's note from 12/26/12 during consult for a parotid neoplasm: He received radiotherapy to either the neck or the base of skull at [facility] in [place] for lymphoma. He does not believe he received chemotherapy. He believes he received about 3 weeks of radiation. This was on the right side. He has not had recurrence of lymphoma since then.  Pacemaker/ICD? No  Possible current pregnancy? N/A  Is the patient on methotrexate? No  Current Complaints / other details:    BP 137/78   Pulse 75   Temp 98.3 F (36.8 C)   Resp 18   Ht 6' (1.829 m)   SpO2 97% Comment: room air  BMI 36.31 kg/m    Wt Readings from Last 3 Encounters:  03/27/18 267 lb 11.2 oz (121.4 kg)  07/11/17 275 lb (124.7 kg)  12/16/16 275 lb (124.7 kg)  He declined a weight today. He has difficulty standing

## 2018-03-29 ENCOUNTER — Encounter: Payer: Self-pay | Admitting: *Deleted

## 2018-03-29 ENCOUNTER — Other Ambulatory Visit: Payer: Self-pay | Admitting: Family Medicine

## 2018-03-29 ENCOUNTER — Telehealth: Payer: Self-pay | Admitting: *Deleted

## 2018-03-29 DIAGNOSIS — R918 Other nonspecific abnormal finding of lung field: Secondary | ICD-10-CM

## 2018-03-29 NOTE — Telephone Encounter (Signed)
Returned call to pt, discussed per MD, IR will not be doing biopsy. Dr. Leonarda Salon office will do biopsy. His office will call with pt appt time/date

## 2018-03-29 NOTE — Progress Notes (Signed)
Oncology Nurse Navigator Documentation  Oncology Nurse Navigator Flowsheets 03/29/2018  Navigator Location [facility]  Navigator Encounter Type Other/per Dr. Julien Nordmann, referral to Dr. Roxan Hockey completed. I will update his office.   Treatment Phase Abnormal Scans  Barriers/Navigation Needs Coordination of Care  Interventions Coordination of Care  Coordination of Care Other  Acuity Level 2  Time Spent with Patient 15

## 2018-04-02 ENCOUNTER — Other Ambulatory Visit: Payer: Self-pay | Admitting: Internal Medicine

## 2018-04-02 ENCOUNTER — Ambulatory Visit (HOSPITAL_COMMUNITY)
Admission: RE | Admit: 2018-04-02 | Discharge: 2018-04-02 | Disposition: A | Discharge status: Home / Self Care | Payer: Medicare Other | Source: Ambulatory Visit | Attending: Internal Medicine | Admitting: Internal Medicine

## 2018-04-02 ENCOUNTER — Telehealth: Payer: Self-pay | Admitting: Medical Oncology

## 2018-04-02 DIAGNOSIS — C3490 Malignant neoplasm of unspecified part of unspecified bronchus or lung: Secondary | ICD-10-CM | POA: Insufficient documentation

## 2018-04-02 MED ORDER — GADOBENATE DIMEGLUMINE 529 MG/ML IV SOLN: 20.0000 mL | Freq: Once | INTRAVENOUS | Status: DC | PRN

## 2018-04-02 NOTE — Telephone Encounter (Signed)
MRI done without contrast due to no venous access. Mohamed notified.

## 2018-04-03 ENCOUNTER — Other Ambulatory Visit: Payer: Self-pay

## 2018-04-03 ENCOUNTER — Ambulatory Visit
Admission: RE | Admit: 2018-04-03 | Discharge: 2018-04-03 | Disposition: A | Discharge status: Home / Self Care | Payer: Medicare Other | Source: Ambulatory Visit | Attending: Radiation Oncology | Admitting: Radiation Oncology

## 2018-04-03 ENCOUNTER — Encounter: Payer: Self-pay | Admitting: Radiation Oncology

## 2018-04-03 VITALS — BP 137/78 | HR 75 | Temp 98.3°F | Resp 18 | Ht 72.0 in

## 2018-04-03 DIAGNOSIS — Z85858 Personal history of malignant neoplasm of other endocrine glands: Secondary | ICD-10-CM | POA: Diagnosis not present

## 2018-04-03 DIAGNOSIS — C3412 Malignant neoplasm of upper lobe, left bronchus or lung: Secondary | ICD-10-CM | POA: Diagnosis not present

## 2018-04-03 DIAGNOSIS — R918 Other nonspecific abnormal finding of lung field: Secondary | ICD-10-CM

## 2018-04-03 NOTE — Progress Notes (Signed)
Radiation Oncology         [phone] ________________________________  Name: [name] MRN: [id number]  Date: 04/03/2018  DOB: Apr 30, 1946  Reconsult Note  Outpatient  CC: [name], MD  [name], MD  Diagnosis and Prior Radiotherapy:    ICD-10-CM   1. Lung mass R91.8 Ambulatory referral to Social Work  2. 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  Since consultation however, the patient presented to PCP complaining of pain on the right side of the face near the ear. 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 There was partially visualized 4.0 cm left upper lobe lung mass concerning for primary lung malignancy.  PET scan on 03/21/18 showed 3.6 cm lobulated left upper lobe lung mass hypermetabolic and consistent with neoplasm. No mediastinal or hilar adenopathy. Also seen was 9 mm left adrenal gland nodule mildly hypermetabolic and could represent a metastatic focus. Close surveillance is suggested.  Patient underwent brain MRI w/o contrast yesterday. There were no mass-like findings.   I have personally reviewed the patient's imaging.  The patient met with Dr. Julien Nordmann on 03/27/18. He does not feel that [name] is a good surgical candidate but that he would benefit from radiotherapy to the left upper  lobe lung mass. He is scheduled to meet with radiology for possible needle biopsy and he is scheduled to meet with Dr. Roxan Hockey on Thursday.                             On review of systems, the patient endorses stroke history in 1998. He continues to experience right sided paralysis. Patient endorses expressive aphasia. Endorses improvement with pain at left ear. Endorses chronic headaches. Endorses weight gain. Denies SOB, cough. Endorses gastroparesis. Endorses diabetes. Endorses urinary incontinence and dribbling. Endorses mild depressive symptoms . Endorses smoking history but quit 21 years ago. Patient denies strong family history of cancer.   ALLERGIES:  has No Known Allergies.  Meds: Current Outpatient Medications  Medication Sig Dispense Refill  . allopurinol (ZYLOPRIM) 300 MG tablet TAKE 1 TABLET BY MOUTH EVERY DAY 90 tablet 0  . amLODipine (NORVASC) 10 MG tablet TAKE 1 TABLET BY MOUTH DAILY 90 tablet 0  . cloNIDine (CATAPRES) 0.2 MG tablet TAKE 1 TABLET BY MOUTH THREE TIMES DAILY 270 tablet 0  . clotrimazole (LOTRIMIN) 1 % cream Apply 1 application topically 2 (two) times daily. 30 g 0  . clotrimazole-betamethasone (LOTRISONE) cream APPLY EXTERNALLY TO THE AFFECTED AREA TWICE DAILY 45 g 0  . furosemide (LASIX) 40 MG tablet Take 1 tablet (40 mg total) by mouth daily. 90 tablet 3  . furosemide (LASIX) 40 MG tablet TAKE 1 TABLET BY MOUTH DAILY 90 tablet 0  . gabapentin (NEURONTIN) 300 MG capsule  TAKE 2 CAPSULES(600 MG) BY MOUTH THREE TIMES DAILY 540 capsule 0  . Insulin Glargine (LANTUS SOLOSTAR) 100 UNIT/ML Solostar Pen Inject 50 Units into the skin at bedtime. (Patient taking differently: Inject 55 Units into the skin at bedtime. ) 15 mL 2  . levothyroxine (SYNTHROID, LEVOTHROID) 88 MCG tablet TAKE 1 TABLET(88 MCG) BY MOUTH DAILY 30 tablet 2  . lisinopril (PRINIVIL,ZESTRIL) 20 MG tablet TAKE 1 TABLET BY MOUTH DAILY 90 tablet 0  . lubiprostone (AMITIZA) 24 MCG capsule Take 1 capsule  (24 mcg total) by mouth 2 (two) times daily with a meal. 60 capsule 11  . metFORMIN (GLUCOPHAGE) 500 MG tablet TAKE 1 TABLET BY MOUTH TWICE DAILY 180 tablet 0  . metoprolol succinate (TOPROL-XL) 50 MG 24 hr tablet TAKE 1 TABLET BY MOUTH DAILY WITH OR IMMEDIATELY FOLLOWING A MEAL 90 tablet 0  . ONETOUCH DELICA LANCETS FINE MISC USE AS DIRECTED FOUR TIMES DAILY 200 each 2  . ONETOUCH VERIO test strip TEST FOUR TIMES DAILY 150 each 5  . oxyCODONE-acetaminophen (PERCOCET) 7.5-325 MG tablet Take 1 tablet by mouth every 4 (four) hours as needed. 180 tablet 0  . pantoprazole (PROTONIX) 40 MG tablet TAKE 1 TABLET BY MOUTH DAILY 90 tablet 0  . simvastatin (ZOCOR) 20 MG tablet TAKE 1 TABLET BY MOUTH EVERY NIGHT AT BEDTIME 90 tablet 0  . tamsulosin (FLOMAX) 0.4 MG CAPS capsule TAKE 1 CAPSULE BY MOUTH DAILY 90 capsule 0  . tiZANidine (ZANAFLEX) 2 MG tablet TAKE 1 TABLET(2 MG) BY MOUTH TWICE DAILY AS NEEDED 180 tablet 2  . Triamcinolone Acetonide (TRIAMCINOLONE 0.1 % CREAM : EUCERIN) CREA Apply 1 application topically 2 (two) times daily as needed. 1 each 2  . potassium chloride SA (K-DUR,KLOR-CON) 20 MEQ tablet TAKE 1 TABLET(20 MEQ) BY MOUTH DAILY 30 tablet 0   No current facility-administered medications for this encounter.    ROS: A 10+ POINT REVIEW OF SYSTEMS WAS OBTAINED including neurology, dermatology, psychiatry, cardiac, respiratory, lymph, extremities, GI, GU, Musculoskeletal, constitutional,  HEENT.  All pertinent positives are noted in the HPI.  All others are negative.  Physical Findings:  height is 6' (1.829 m). His temperature is 98.3 F (36.8 C). His blood pressure is 137/78 and his pulse is 75. His respiration is 18 and oxygen saturation is 97%.    The patient is in no acute distress. Patient is alert and oriented. General: Alert and oriented, in no acute distress, presents in wheelchair HEENT: Head is normocephalic. Oropharynx is clear. Moist mucus membranes. No periparotid masses  bilaterally. Neck: Neck is supple, no palpable cervical or supraclavicular lymphadenopathy.  Heart: No rubs or gallops. Systolic murmur loudest at aortic region. Regular rate and rhythm.  Chest: Clear to auscultation bilaterally, with no rhonchi, wheezes, or rales. Abdomen: Soft, nontender, nondistended, with no rigidity or guarding. Extremities: Pitting edema bilaterally. Lymphatics: see Neck Exam Skin: No concerning lesions. Superficial peeling in 3 spots in level II region of right neck. These patches of bleeding, peeling skin are less than 1 cm each. Suggests this could be result of shaving accident. Musculoskeletal:  Right sided paralysis, in [facility] Neurologic: Right sided paralysis, left eye deviates laterally. Psychiatric: Judgment and insight are intact.    Lab Findings: Lab Results  Component Value Date   WBC 8.9 11/01/2017   HGB 13.5 11/01/2017   HCT 43.0 11/01/2017   MCV 74.4 (L) 11/01/2017   PLT 275 11/01/2017    Radiographic Findings: Mr Brain Wo Contrast  Result Date: 04/02/2018 CLINICAL  DATA:  Lung cancer staging. EXAM: MRI HEAD WITHOUT CONTRAST TECHNIQUE: Multiplanar, multiecho pulse sequences of the brain and surrounding structures were obtained without intravenous contrast. COMPARISON:  None. FINDINGS: Brain: IV contrast could not be obtained despite attempts by 3 individuals. There is no masslike finding or signal abnormality suggestive of metastatic disease. Remote hemorrhage centered in the left basal ganglia with surrounding gliosis, hemosiderin staining, and wallerian atrophy seen to the brainstem. Remote perforator infarct in the right centrum semiovale with gliosis crossing the corpus callosum. No acute hemorrhage, hydrocephalus, or collection. Vascular: Major flow voids are preserved. Skull and upper cervical spine: No visible marrow lesion. C3-4 intervertebral ankylosis or fusion. Advanced C2-3 facet degeneration. Sinuses/Orbits: Dysconjugate gaze, nonspecific.  Cataract resection on the right. Other: Probable dermal inclusion cyst along the superficial left parotid. Postoperative superficial right parotid. IMPRESSION: 1. Despite multiple attempts IV access could not be secured and the study was performed without contrast. 2. No masslike finding to suggest metastatic disease. 3. Large remote hemorrhagic stroke centered in the left basal ganglia. Electronically Signed   By: Monte Fantasia M.D.   On: 04/02/2018 10:40   Nm Pet Image Initial (pi) Skull Base To Thigh  Result Date: 03/21/2018 CLINICAL DATA:  Initial treatment strategy for left upper lobe lung mass. EXAM: NUCLEAR MEDICINE PET SKULL BASE TO THIGH TECHNIQUE: 13.4 mCi F-18 FDG was injected intravenously. Full-ring PET imaging was performed from the skull base to thigh after the radiotracer. CT data was obtained and used for attenuation correction and anatomic localization. Fasting blood glucose: 120 mg/dl COMPARISON:  Neck CT 02/28/2018 FINDINGS: Mediastinal blood pool activity: SUV max 3.09 NECK: No hypermetabolic lymph nodes in the neck. Incidental CT findings: Simple appearing cyst superficial to the left parotid gland. CHEST: The lobulated 3.6 cm left upper lobe lung mass is hypermetabolic with SUV max of 30.16 and consistent with neoplasm. No enlarged or hypermetabolic mediastinal/hilar lymph nodes. Incidental CT findings: No other pulmonary nodules are identified. Surgical scarring changes noted in the right upper lobe and there are emphysematous changes. ABDOMEN/PELVIS: 9 mm left adrenal gland nodule is mildly hypermetabolic with SUV max of 0.10. Could not exclude a metastatic focus. No hepatic lesions and no abdominal/pelvic lymphadenopathy. Incidental CT findings: Advanced atherosclerotic calcifications involving the aorta and iliac arteries. Multiple bilateral renal cysts are noted. SKELETON: There is a small skin lesion along the lateral aspect of the right breast which is mildly hypermetabolic with  SUV max of 2.38. Recommend visual inspection. No findings suspicious for osseous metastatic disease. Marked arthropathic changes involving the left shoulder with some hypermetabolism. Incidental CT findings: none IMPRESSION: 1. 3.6 cm lobulated left upper lobe lung mass is hypermetabolic and consistent with neoplasm. No mediastinal or hilar adenopathy. 2. 9 mm left adrenal gland nodule is mildly hypermetabolic and could represent a metastatic focus. Close surveillance is suggested. 3. No other findings for metastatic disease. 4. Small hypermetabolic skin lesion involving the lateral aspect of the right breast. Recommend correlation with clinical exam. 5. Emphysematous changes and pulmonary scarring. Electronically Signed   By: Marijo Sanes M.D.   On: 03/21/2018 15:35    Impression/Plan:  LUL mass, possible new primary, cannot exclude metastasis  It was a pleasure meeting the patient today. We discussed the risks, benefits, and side effects of radiotherapy. Short term side effects could include fatigue and darkening of skin near radiation site. Potential long term side effects include scarring of lung tissue   No guarantees of treatment were given.   Depressive symptoms  since stroke years ago: encouraged the patient to discuss his mood with PCP. May benefit from antidepressants  The patient is a candidate for 5 weeks of daily radiation (26 treatments) vs 1.5 weeks (5 treatments). He will meet with Dr. Roxan Hockey beforehand to discuss potential for surgical intervention or biopsy. Dr. Roxan Hockey will refer the patient back to me if/when appropriate. If biopsy is not possible due to severe emphysema, I would still be willing to treat with radiation.  About 3 months after treatment it would be prudent to perform PET scan to ensure satisfactory response to treatment in lung and also monitor the adrenal lesion.  I have contacted Dr. Roxan Hockey to discuss case - they are scheduled to meet on Thursday  I  spent 30 minutes face to face with the patient and more than 50% of that time was spent in counseling and/or coordination of care.   _____________________________________   [name], MD  This document serves as a record of services personally performed by [name], MD. It was created on his behalf by Linward Natal, a trained medical scribe. The creation of this record is based on the scribe's personal observations and the provider's statements to them. This document has been checked and approved by the attending provider.

## 2018-04-05 ENCOUNTER — Encounter: Payer: Medicare Other | Admitting: Thoracic Surgery (Cardiothoracic Vascular Surgery)

## 2018-04-05 ENCOUNTER — Other Ambulatory Visit: Payer: Self-pay | Admitting: Family Medicine

## 2018-04-06 ENCOUNTER — Encounter: Payer: Self-pay | Admitting: *Deleted

## 2018-04-06 ENCOUNTER — Encounter: Payer: Self-pay | Admitting: Radiation Oncology

## 2018-04-06 DIAGNOSIS — C3412 Malignant neoplasm of upper lobe, left bronchus or lung: Secondary | ICD-10-CM | POA: Insufficient documentation

## 2018-04-06 NOTE — Progress Notes (Signed)
[place] Psychosocial Distress Screening Clinical Social Work  Clinical Social Work was referred by distress screening protocol.  The patient scored a 5 on the Psychosocial Distress Thermometer which indicates moderate distress. Clinical Social Worker attempted to contact patient to assess for distress and other psychosocial needs. CSW left voicemail to return call.  ONCBCN DISTRESS SCREENING 04/03/2018  Screening Type Initial Screening  Distress experienced in past week (1-10) 5  Practical problem type Insurance  Emotional problem type Isolation/feeling alone  Spiritual/Religous concerns type Loss of sense of purpose  Physical Problem type Pain    [name], MSW, LCSW, OSW-C Clinical Social Worker [facility] [phone]

## 2018-04-09 ENCOUNTER — Other Ambulatory Visit: Payer: Self-pay | Admitting: *Deleted

## 2018-04-09 ENCOUNTER — Institutional Professional Consult (permissible substitution): Payer: Medicare Other | Admitting: Thoracic Surgery (Cardiothoracic Vascular Surgery)

## 2018-04-09 VITALS — BP 126/83 | HR 70 | Resp 20 | Ht 72.0 in | Wt 267.0 lb

## 2018-04-09 DIAGNOSIS — R911 Solitary pulmonary nodule: Secondary | ICD-10-CM | POA: Diagnosis not present

## 2018-04-09 DIAGNOSIS — J984 Other disorders of lung: Secondary | ICD-10-CM

## 2018-04-09 NOTE — H&P (View-Only) (Signed)
PCP is [facility], [name], MD Referring Provider is [name], MD  Chief Complaint  Patient presents with  . Lung Lesion    Surgical eval, Ct Neck 02/28/18, Pet Scan 03/21/18, MR Brain 04/02/18    HPI: [name] is a 72 year old gentleman sent for consultation regarding a left upper lobe mass.  [name] is a 72 year old gentleman with a complex medical history including tobacco abuse, COPD, right pneumothorax requiring VATS, stroke with right hemiparesis, Barrett's esophagus, asthma, severe peripheral edema, diabetes mellitus, hypertension, hyperlipidemia, hepatitis, gout, lymphoma, and a parotid tumor.  He recently presented with pain on the right side of his face near his ear.  He had a CT of the neck which showed no recurrence of his right parotid lesion but there was a partially visualized left upper lobe lung mass.  A PET/CT showed a hypermetabolic 3.6 cm spiculated left upper lobe mass with surrounding bullae.  There was a 9 mm left adrenal nodule that was mildly hypermetabolic.  This was indeterminate.  He was seen in consultation by Dr. Julien [name].  He referred to Dr. Isidore [name] for radiation.  She wishes to have a biopsy done prior to initiating radiation.  He smoked about 2 packs of cigarettes daily for 35 years prior to quitting 20 years ago.  (70-pack-year history).  Physical activity very limited due to stroke with right hemiparesis.  In wheelchair majority of time.  Can stand to go to bed and bathroom.  No change in appetite or weight loss.  Zubrod Score: At the time of surgery this patient's most appropriate activity status/level should be described as: []     0    Normal activity, no symptoms []     1    Restricted in physical strenuous activity but ambulatory, able to do out light work []     2    Ambulatory and capable of self care, unable to do work activities, up and about >50 % of waking hours                              [x]     3    Only limited self care, in bed greater than  50% of waking hours []     4    Completely disabled, no self care, confined to bed or chair []     5    Moribund  Past Medical History:  Diagnosis Date  . Arthritis   . Asthma    "hx of"  . Barrett's esophagus    EGD 03/23/2011 & EGD 2/09 bx proven  . BPH (benign prostatic hyperplasia)   . Cancer of parotid gland ([place]) 11/23/12   Adenocarcinoma  . Chronic abdominal pain   . Colon polyp 03/23/2011   tubular adenoma, Dr. Gala Romney  . 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 ([place]) 07/15/2011  . CVA (cerebral infarction) 1998   right sided deficit  . Diverticulosis    TCS 03/23/11 pancolonic diverticula &TCS 5/08, pancolonic diverticula  . DM (diabetes mellitus) ([place])   . Edema of lower extremity 12/21/12   bilateral   . Esotropia of left eye   . GERD (gastroesophageal reflux disease)   . Glaucoma (increased eye pressure)   . Gout   . Gout   . Hemorrhagic colitis 06/06/2012.  [name] Hemorrhoids, internal 03/23/2011   tcs by Dr. Gala Romney  . Hepatitis    esosiniphilic, tx with prednisone  . Hiatal hernia   . HTN (  hypertension)   . Hx of radiation therapy 1974   right base of skull area-lymphoma  . Hyperlipidemia   . Lower facial weakness    Right  . Lymphoma ([place]) 1974   XRT at [facility], right base of skull area  . Neuropathy   . Renal insufficiency   . Steatohepatitis    liver biopsy 2009  . Stroke [facility]) 1998   right hemiparesis/plegia    Past Surgical History:  Procedure Laterality Date  . BIOPSY  12/01/2016   Procedure: BIOPSY;  Surgeon: Daneil Dolin, MD;  Location: [facility];  Service: Endoscopy;;  duodenum, gastric, esophagus  . CHOLECYSTECTOMY    . COLONOSCOPY  03/23/11   Dr. Gala Romney  pancolonic diverticula, hemorrhoids, tubular adenoma.. next tcs 03/2016  . COLONOSCOPY WITH PROPOFOL N/A 12/01/2016   inadequate bowel prep precluded exam  . ESOPHAGOGASTRODUODENOSCOPY  02/05/08   goblet cell metaplasia/negative for H.pylori  . ESOPHAGOGASTRODUODENOSCOPY  03/23/11   Dr.  Gala Romney, barretts, hiatal hernia  . ESOPHAGOGASTRODUODENOSCOPY (EGD) WITH PROPOFOL N/A 12/01/2016   Dr. Gala Romney: Large amount of retained gastric contents precluded completion of the stomach. Mucosal changes were found in the stomach. Erosions and somewhat scalloped appearing mucosa present, reactive gastritis/no H pylori. Barrett's esophagus noted, no dysplasia on biopsy. Duodenal biopsies taken as well, benign, no evidence of eosinophilia.  [name] MASS BIOPSY  11/01/2012   Procedure: NECK MASS BIOPSY;  Surgeon: Ascencion Dike, MD;  Location: [facility] OR[facility];  Service: ENT;  Laterality: Right;  Excisional Bx Right Neck Mass; attempted external jugular cutdown of left side  . PAROTIDECTOMY  11/24/2012   Procedure: PAROTIDECTOMY;  Surgeon: Ascencion Dike, MD;  Location: [facility];  Service: ENT;  Laterality: N/A;  Total parotidectomy  . PLEURECTOMY    . right lymph node removal Right    behind right ear  . Right video-assisted thoracic surgery, pleurectomy, and pleurodesis  2011    Family History  Problem Relation Age of Onset  . Heart failure Mother   . Heart failure Father   . Heart failure Sister   . Heart failure Son     Social History Social History   Tobacco Use  . Smoking status: Former Smoker    Packs/day: 3.00    Years: 25.00    Pack years: 75.00    Types: Cigarettes    Last attempt to quit: 03/01/1997    Years since quitting: 21.1  . Smokeless tobacco: Never Used  Substance Use Topics  . Alcohol use: No  . Drug use: No    Current Outpatient Medications  Medication Sig Dispense Refill  . allopurinol (ZYLOPRIM) 300 MG tablet TAKE 1 TABLET BY MOUTH EVERY DAY 90 tablet 0  . amLODipine (NORVASC) 10 MG tablet TAKE 1 TABLET BY MOUTH DAILY 90 tablet 0  . cloNIDine (CATAPRES) 0.2 MG tablet TAKE 1 TABLET BY MOUTH THREE TIMES DAILY 270 tablet 0  . clotrimazole (LOTRIMIN) 1 % cream Apply 1 application topically 2 (two) times daily. 30 g 0  . clotrimazole-betamethasone (LOTRISONE) cream APPLY EXTERNALLY TO  THE AFFECTED AREA TWICE DAILY 45 g 0  . furosemide (LASIX) 40 MG tablet Take 1 tablet (40 mg total) by mouth daily. (Patient taking differently: Take 40 mg by mouth 2 (two) times daily. ) 90 tablet 3  . gabapentin (NEURONTIN) 300 MG capsule TAKE 2 CAPSULES(600 MG) BY MOUTH THREE TIMES DAILY 540 capsule 0  . Insulin Glargine (LANTUS SOLOSTAR) 100 UNIT/ML Solostar Pen Inject 50 Units into the skin at bedtime. (  Patient taking differently: Inject 55 Units into the skin at bedtime. ) 15 mL 2  . levothyroxine (SYNTHROID, LEVOTHROID) 88 MCG tablet TAKE 1 TABLET(88 MCG) BY MOUTH DAILY 30 tablet 2  . lisinopril (PRINIVIL,ZESTRIL) 20 MG tablet TAKE 1 TABLET BY MOUTH DAILY 90 tablet 0  . lubiprostone (AMITIZA) 24 MCG capsule Take 1 capsule (24 mcg total) by mouth 2 (two) times daily with a meal. 60 capsule 11  . metFORMIN (GLUCOPHAGE) 500 MG tablet TAKE 1 TABLET BY MOUTH TWICE DAILY 180 tablet 0  . metoprolol succinate (TOPROL-XL) 50 MG 24 hr tablet TAKE 1 TABLET BY MOUTH DAILY WITH OR IMMEDIATELY FOLLOWING A MEAL 90 tablet 0  . ONETOUCH DELICA LANCETS FINE MISC USE AS DIRECTED FOUR TIMES DAILY 200 each 2  . ONETOUCH VERIO test strip TEST FOUR TIMES DAILY 150 each 5  . oxyCODONE-acetaminophen (PERCOCET) 7.5-325 MG tablet Take 1 tablet by mouth every 4 (four) hours as needed. 180 tablet 0  . pantoprazole (PROTONIX) 40 MG tablet TAKE 1 TABLET BY MOUTH DAILY 90 tablet 0  . potassium chloride SA (K-DUR,KLOR-CON) 20 MEQ tablet TAKE 1 TABLET(20 MEQ) BY MOUTH DAILY 30 tablet 0  . simvastatin (ZOCOR) 20 MG tablet TAKE 1 TABLET BY MOUTH EVERY NIGHT AT BEDTIME 90 tablet 0  . tamsulosin (FLOMAX) 0.4 MG CAPS capsule TAKE 1 CAPSULE BY MOUTH DAILY 90 capsule 0  . tiZANidine (ZANAFLEX) 2 MG tablet TAKE 1 TABLET(2 MG) BY MOUTH TWICE DAILY AS NEEDED 180 tablet 2  . Triamcinolone Acetonide (TRIAMCINOLONE 0.1 % CREAM : EUCERIN) CREA Apply 1 application topically 2 (two) times daily as needed. 1 each 2   No current  facility-administered medications for this visit.     No Known Allergies  Review of Systems  Constitutional: Negative for unexpected weight change.  HENT: Positive for hearing loss.   Eyes: Negative for visual disturbance.  Respiratory: Positive for shortness of breath.   Cardiovascular: Positive for leg swelling. Negative for chest pain.  Gastrointestinal: Positive for abdominal pain and blood in stool.  Genitourinary: Negative for difficulty urinating and dysuria.  Musculoskeletal: Positive for arthralgias, gait problem and joint swelling.  Skin:       Recurrent infections  Neurological: Positive for weakness (right hemiparesis) and headaches.    BP 126/83   Pulse 70   Resp 20   Ht 6' (1.829 m)   Wt 267 lb (121.1 kg)   SpO2 98% Comment: RA  BMI 36.21 kg/m  Physical Exam  Constitutional: He is oriented to person, place, and time. No distress.  72 year old obese male in wheelchair  HENT:  Mouth/Throat: No oropharyngeal exudate.  Eyes: Conjunctivae and EOM are normal.  Neck: No tracheal deviation present.  Pulmonary/Chest: No respiratory distress. He has no wheezes.  Diminished breath sounds bilaterally  Abdominal: Soft. He exhibits no distension. There is no tenderness.  Musculoskeletal: He exhibits edema (4+ both lower extremities) and deformity (Contractures right upper extremity).  Lymphadenopathy:    He has no cervical adenopathy.  Neurological: He is alert and oriented to person, place, and time.  Right hemiparesis, contractures right upper extremity  Vitals reviewed.    Diagnostic Tests: NUCLEAR MEDICINE PET SKULL BASE TO THIGH  TECHNIQUE: 13.4 mCi F-18 FDG was injected intravenously. Full-ring PET imaging was performed from the skull base to thigh after the radiotracer. CT data was obtained and used for attenuation correction and anatomic localization.  Fasting blood glucose: 120 mg/dl  COMPARISON:  Neck CT 02/28/2018  FINDINGS: Mediastinal blood  pool activity: SUV max 3.09  NECK: No hypermetabolic lymph nodes in the neck.  Incidental CT findings: Simple appearing cyst superficial to the left parotid gland.  CHEST: The lobulated 3.6 cm left upper lobe lung mass is hypermetabolic with SUV max of 07.68 and consistent with neoplasm.  No enlarged or hypermetabolic mediastinal/hilar lymph nodes.  Incidental CT findings: No other pulmonary nodules are identified. Surgical scarring changes noted in the right upper lobe and there are emphysematous changes.  ABDOMEN/PELVIS: 9 mm left adrenal gland nodule is mildly hypermetabolic with SUV max of 0.88. Could not exclude a metastatic focus.  No hepatic lesions and no abdominal/pelvic lymphadenopathy.  Incidental CT findings: Advanced atherosclerotic calcifications involving the aorta and iliac arteries. Multiple bilateral renal cysts are noted.  SKELETON: There is a small skin lesion along the lateral aspect of the right breast which is mildly hypermetabolic with SUV max of 1.10. Recommend visual inspection. No findings suspicious for osseous metastatic disease. Marked arthropathic changes involving the left shoulder with some hypermetabolism.  Incidental CT findings: none  IMPRESSION: 1. 3.6 cm lobulated left upper lobe lung mass is hypermetabolic and consistent with neoplasm. No mediastinal or hilar adenopathy. 2. 9 mm left adrenal gland nodule is mildly hypermetabolic and could represent a metastatic focus. Close surveillance is suggested. 3. No other findings for metastatic disease. 4. Small hypermetabolic skin lesion involving the lateral aspect of the right breast. Recommend correlation with clinical exam. 5. Emphysematous changes and pulmonary scarring.   Electronically Signed   By: Marijo Sanes M.D.   On: 03/21/2018 15:35 I personally reviewed the PET/CT images and concur with the findings noted above  Impression: [name] is a 72 year old man  with a history of tobacco abuse and multiple significant medical problems.  He has been found to have a 3.6 cm spiculated mass in the left upper lobe that is markedly hypermetabolic on PET/CT.  There is also a question of a left adrenal nodule, but that is felt to be indeterminate at this time.  Findings are consistent with a new primary bronchogenic carcinoma, clinical stage IB (T1, N0, M0).  He is not a candidate for surgical resection due to his limited functional status secondary to his previous stroke.  He is potentially a candidate for radiation.  I discussed the possibility of doing a navigational bronchoscopy for biopsy of his left upper lobe lung mass.  I reviewed the indications, risks, benefits, and alternatives with [name] and his family.  They understand the procedure would be done in the operating room under general anesthesia.  We would plan to do it on an outpatient basis.  There is no guarantee will get a definitive diagnosis.  They understand the risks include, but not limited to those associated with general anesthesia including death, MI, stroke, DVT, PE, as well as procedure specific risks such as pneumothorax, bleeding, and failure to make a diagnosis.  He accepts the risks and wishes to proceed.  Plan: CT chest super D protocol-for preoperative planning for navigational bronchoscopy.  Electromagnetic navigational bronchoscopy on Wednesday 04/18/2018  Melrose Nakayama, MD [facility] [phone]

## 2018-04-09 NOTE — Progress Notes (Signed)
PCP is Fate, [name], MD Referring Provider is [name], MD  Chief Complaint  Patient presents with  . Lung Lesion    Surgical eval, Ct Neck 02/28/18, Pet Scan 03/21/18, MR Brain 04/02/18    HPI: Mr. Kinner is a 72 year old gentleman sent for consultation regarding a left upper lobe mass.  Mr. Dion is a 72 year old gentleman with a complex medical history including tobacco abuse, COPD, right pneumothorax requiring VATS, stroke with right hemiparesis, Barrett's esophagus, asthma, severe peripheral edema, diabetes mellitus, hypertension, hyperlipidemia, hepatitis, gout, lymphoma, and a parotid tumor.  He recently presented with pain on the right side of his face near his ear.  He had a CT of the neck which showed no recurrence of his right parotid lesion but there was a partially visualized left upper lobe lung mass.  A PET/CT showed a hypermetabolic 3.6 cm spiculated left upper lobe mass with surrounding bullae.  There was a 9 mm left adrenal nodule that was mildly hypermetabolic.  This was indeterminate.  He was seen in consultation by Dr. Julien Nordmann.  He referred to Dr. Isidore Moos for radiation.  She wishes to have a biopsy done prior to initiating radiation.  He smoked about 2 packs of cigarettes daily for 35 years prior to quitting 20 years ago.  (70-pack-year history).  Physical activity very limited due to stroke with right hemiparesis.  In wheelchair majority of time.  Can stand to go to bed and bathroom.  No change in appetite or weight loss.  Zubrod Score: At the time of surgery this patient's most appropriate activity status/level should be described as: []     0    Normal activity, no symptoms []     1    Restricted in physical strenuous activity but ambulatory, able to do out light work []     2    Ambulatory and capable of self care, unable to do work activities, up and about >50 % of waking hours                              [x]     3    Only limited self care, in bed greater than  50% of waking hours []     4    Completely disabled, no self care, confined to bed or chair []     5    Moribund  Past Medical History:  Diagnosis Date  . Arthritis   . Asthma    "hx of"  . Barrett's esophagus    EGD 03/23/2011 & EGD 2/09 bx proven  . BPH (benign prostatic hyperplasia)   . Cancer of parotid gland ([place]) 11/23/12   Adenocarcinoma  . Chronic abdominal pain   . Colon polyp 03/23/2011   tubular adenoma, Dr. Gala Romney  . 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 ([place]) 07/15/2011  . CVA (cerebral infarction) 1998   right sided deficit  . Diverticulosis    TCS 03/23/11 pancolonic diverticula &TCS 5/08, pancolonic diverticula  . DM (diabetes mellitus) ([place])   . Edema of lower extremity 12/21/12   bilateral   . Esotropia of left eye   . GERD (gastroesophageal reflux disease)   . Glaucoma (increased eye pressure)   . Gout   . Gout   . Hemorrhagic colitis 06/06/2012.  [name] Hemorrhoids, internal 03/23/2011   tcs by Dr. Gala Romney  . Hepatitis    esosiniphilic, tx with prednisone  . Hiatal hernia   . HTN (  hypertension)   . Hx of radiation therapy 1974   right base of skull area-lymphoma  . Hyperlipidemia   . Lower facial weakness    Right  . Lymphoma ([place]) 1974   XRT at [facility], right base of skull area  . Neuropathy   . Renal insufficiency   . Steatohepatitis    liver biopsy 2009  . Stroke [facility]) 1998   right hemiparesis/plegia    Past Surgical History:  Procedure Laterality Date  . BIOPSY  12/01/2016   Procedure: BIOPSY;  Surgeon: Daneil Dolin, MD;  Location: [facility];  Service: Endoscopy;;  duodenum, gastric, esophagus  . CHOLECYSTECTOMY    . COLONOSCOPY  03/23/11   Dr. Gala Romney  pancolonic diverticula, hemorrhoids, tubular adenoma.. next tcs 03/2016  . COLONOSCOPY WITH PROPOFOL N/A 12/01/2016   inadequate bowel prep precluded exam  . ESOPHAGOGASTRODUODENOSCOPY  02/05/08   goblet cell metaplasia/negative for H.pylori  . ESOPHAGOGASTRODUODENOSCOPY  03/23/11   Dr.  Gala Romney, barretts, hiatal hernia  . ESOPHAGOGASTRODUODENOSCOPY (EGD) WITH PROPOFOL N/A 12/01/2016   Dr. Gala Romney: Large amount of retained gastric contents precluded completion of the stomach. Mucosal changes were found in the stomach. Erosions and somewhat scalloped appearing mucosa present, reactive gastritis/no H pylori. Barrett's esophagus noted, no dysplasia on biopsy. Duodenal biopsies taken as well, benign, no evidence of eosinophilia.  [name] MASS BIOPSY  11/01/2012   Procedure: NECK MASS BIOPSY;  Surgeon: Ascencion Dike, MD;  Location: [facility] OR[facility];  Service: ENT;  Laterality: Right;  Excisional Bx Right Neck Mass; attempted external jugular cutdown of left side  . PAROTIDECTOMY  11/24/2012   Procedure: PAROTIDECTOMY;  Surgeon: Ascencion Dike, MD;  Location: [place];  Service: ENT;  Laterality: N/A;  Total parotidectomy  . PLEURECTOMY    . right lymph node removal Right    behind right ear  . Right video-assisted thoracic surgery, pleurectomy, and pleurodesis  2011    Family History  Problem Relation Age of Onset  . Heart failure Mother   . Heart failure Father   . Heart failure Sister   . Heart failure Son     Social History Social History   Tobacco Use  . Smoking status: Former Smoker    Packs/day: 3.00    Years: 25.00    Pack years: 75.00    Types: Cigarettes    Last attempt to quit: 03/01/1997    Years since quitting: 21.1  . Smokeless tobacco: Never Used  Substance Use Topics  . Alcohol use: No  . Drug use: No    Current Outpatient Medications  Medication Sig Dispense Refill  . allopurinol (ZYLOPRIM) 300 MG tablet TAKE 1 TABLET BY MOUTH EVERY DAY 90 tablet 0  . amLODipine (NORVASC) 10 MG tablet TAKE 1 TABLET BY MOUTH DAILY 90 tablet 0  . cloNIDine (CATAPRES) 0.2 MG tablet TAKE 1 TABLET BY MOUTH THREE TIMES DAILY 270 tablet 0  . clotrimazole (LOTRIMIN) 1 % cream Apply 1 application topically 2 (two) times daily. 30 g 0  . clotrimazole-betamethasone (LOTRISONE) cream APPLY EXTERNALLY TO  THE AFFECTED AREA TWICE DAILY 45 g 0  . furosemide (LASIX) 40 MG tablet Take 1 tablet (40 mg total) by mouth daily. (Patient taking differently: Take 40 mg by mouth 2 (two) times daily. ) 90 tablet 3  . gabapentin (NEURONTIN) 300 MG capsule TAKE 2 CAPSULES(600 MG) BY MOUTH THREE TIMES DAILY 540 capsule 0  . Insulin Glargine (LANTUS SOLOSTAR) 100 UNIT/ML Solostar Pen Inject 50 Units into the skin at bedtime. (  Patient taking differently: Inject 55 Units into the skin at bedtime. ) 15 mL 2  . levothyroxine (SYNTHROID, LEVOTHROID) 88 MCG tablet TAKE 1 TABLET(88 MCG) BY MOUTH DAILY 30 tablet 2  . lisinopril (PRINIVIL,ZESTRIL) 20 MG tablet TAKE 1 TABLET BY MOUTH DAILY 90 tablet 0  . lubiprostone (AMITIZA) 24 MCG capsule Take 1 capsule (24 mcg total) by mouth 2 (two) times daily with a meal. 60 capsule 11  . metFORMIN (GLUCOPHAGE) 500 MG tablet TAKE 1 TABLET BY MOUTH TWICE DAILY 180 tablet 0  . metoprolol succinate (TOPROL-XL) 50 MG 24 hr tablet TAKE 1 TABLET BY MOUTH DAILY WITH OR IMMEDIATELY FOLLOWING A MEAL 90 tablet 0  . ONETOUCH DELICA LANCETS FINE MISC USE AS DIRECTED FOUR TIMES DAILY 200 each 2  . ONETOUCH VERIO test strip TEST FOUR TIMES DAILY 150 each 5  . oxyCODONE-acetaminophen (PERCOCET) 7.5-325 MG tablet Take 1 tablet by mouth every 4 (four) hours as needed. 180 tablet 0  . pantoprazole (PROTONIX) 40 MG tablet TAKE 1 TABLET BY MOUTH DAILY 90 tablet 0  . potassium chloride SA (K-DUR,KLOR-CON) 20 MEQ tablet TAKE 1 TABLET(20 MEQ) BY MOUTH DAILY 30 tablet 0  . simvastatin (ZOCOR) 20 MG tablet TAKE 1 TABLET BY MOUTH EVERY NIGHT AT BEDTIME 90 tablet 0  . tamsulosin (FLOMAX) 0.4 MG CAPS capsule TAKE 1 CAPSULE BY MOUTH DAILY 90 capsule 0  . tiZANidine (ZANAFLEX) 2 MG tablet TAKE 1 TABLET(2 MG) BY MOUTH TWICE DAILY AS NEEDED 180 tablet 2  . Triamcinolone Acetonide (TRIAMCINOLONE 0.1 % CREAM : EUCERIN) CREA Apply 1 application topically 2 (two) times daily as needed. 1 each 2   No current  facility-administered medications for this visit.     No Known Allergies  Review of Systems  Constitutional: Negative for unexpected weight change.  HENT: Positive for hearing loss.   Eyes: Negative for visual disturbance.  Respiratory: Positive for shortness of breath.   Cardiovascular: Positive for leg swelling. Negative for chest pain.  Gastrointestinal: Positive for abdominal pain and blood in stool.  Genitourinary: Negative for difficulty urinating and dysuria.  Musculoskeletal: Positive for arthralgias, gait problem and joint swelling.  Skin:       Recurrent infections  Neurological: Positive for weakness (right hemiparesis) and headaches.    BP 126/83   Pulse 70   Resp 20   Ht 6' (1.829 m)   Wt 267 lb (121.1 kg)   SpO2 98% Comment: RA  BMI 36.21 kg/m  Physical Exam  Constitutional: He is oriented to person, place, and time. No distress.  72 year old obese male in wheelchair  HENT:  Mouth/Throat: No oropharyngeal exudate.  Eyes: Conjunctivae and EOM are normal.  Neck: No tracheal deviation present.  Pulmonary/Chest: No respiratory distress. He has no wheezes.  Diminished breath sounds bilaterally  Abdominal: Soft. He exhibits no distension. There is no tenderness.  Musculoskeletal: He exhibits edema (4+ both lower extremities) and deformity (Contractures right upper extremity).  Lymphadenopathy:    He has no cervical adenopathy.  Neurological: He is alert and oriented to person, place, and time.  Right hemiparesis, contractures right upper extremity  Vitals reviewed.    Diagnostic Tests: NUCLEAR MEDICINE PET SKULL BASE TO THIGH  TECHNIQUE: 13.4 mCi F-18 FDG was injected intravenously. Full-ring PET imaging was performed from the skull base to thigh after the radiotracer. CT data was obtained and used for attenuation correction and anatomic localization.  Fasting blood glucose: 120 mg/dl  COMPARISON:  Neck CT 02/28/2018  FINDINGS: Mediastinal blood  pool activity: SUV max 3.09  NECK: No hypermetabolic lymph nodes in the neck.  Incidental CT findings: Simple appearing cyst superficial to the left parotid gland.  CHEST: The lobulated 3.6 cm left upper lobe lung mass is hypermetabolic with SUV max of 92.01 and consistent with neoplasm.  No enlarged or hypermetabolic mediastinal/hilar lymph nodes.  Incidental CT findings: No other pulmonary nodules are identified. Surgical scarring changes noted in the right upper lobe and there are emphysematous changes.  ABDOMEN/PELVIS: 9 mm left adrenal gland nodule is mildly hypermetabolic with SUV max of 0.07. Could not exclude a metastatic focus.  No hepatic lesions and no abdominal/pelvic lymphadenopathy.  Incidental CT findings: Advanced atherosclerotic calcifications involving the aorta and iliac arteries. Multiple bilateral renal cysts are noted.  SKELETON: There is a small skin lesion along the lateral aspect of the right breast which is mildly hypermetabolic with SUV max of 1.21. Recommend visual inspection. No findings suspicious for osseous metastatic disease. Marked arthropathic changes involving the left shoulder with some hypermetabolism.  Incidental CT findings: none  IMPRESSION: 1. 3.6 cm lobulated left upper lobe lung mass is hypermetabolic and consistent with neoplasm. No mediastinal or hilar adenopathy. 2. 9 mm left adrenal gland nodule is mildly hypermetabolic and could represent a metastatic focus. Close surveillance is suggested. 3. No other findings for metastatic disease. 4. Small hypermetabolic skin lesion involving the lateral aspect of the right breast. Recommend correlation with clinical exam. 5. Emphysematous changes and pulmonary scarring.   Electronically Signed   By: Marijo Sanes M.D.   On: 03/21/2018 15:35 I personally reviewed the PET/CT images and concur with the findings noted above  Impression: Mr. Ohm is a 72 year old man  with a history of tobacco abuse and multiple significant medical problems.  He has been found to have a 3.6 cm spiculated mass in the left upper lobe that is markedly hypermetabolic on PET/CT.  There is also a question of a left adrenal nodule, but that is felt to be indeterminate at this time.  Findings are consistent with a new primary bronchogenic carcinoma, clinical stage IB (T1, N0, M0).  He is not a candidate for surgical resection due to his limited functional status secondary to his previous stroke.  He is potentially a candidate for radiation.  I discussed the possibility of doing a navigational bronchoscopy for biopsy of his left upper lobe lung mass.  I reviewed the indications, risks, benefits, and alternatives with Mr. Hewitt and his family.  They understand the procedure would be done in the operating room under general anesthesia.  We would plan to do it on an outpatient basis.  There is no guarantee will get a definitive diagnosis.  They understand the risks include, but not limited to those associated with general anesthesia including death, MI, stroke, DVT, PE, as well as procedure specific risks such as pneumothorax, bleeding, and failure to make a diagnosis.  He accepts the risks and wishes to proceed.  Plan: CT chest super D protocol-for preoperative planning for navigational bronchoscopy.  Electromagnetic navigational bronchoscopy on Wednesday 04/18/2018  Melrose Nakayama, MD [facility] [phone]

## 2018-04-11 ENCOUNTER — Ambulatory Visit
Admission: RE | Admit: 2018-04-11 | Discharge: 2018-04-11 | Disposition: A | Discharge status: Home / Self Care | Payer: Medicare Other | Source: Ambulatory Visit | Attending: Thoracic Surgery (Cardiothoracic Vascular Surgery) | Admitting: Thoracic Surgery (Cardiothoracic Vascular Surgery)

## 2018-04-11 DIAGNOSIS — R918 Other nonspecific abnormal finding of lung field: Secondary | ICD-10-CM | POA: Diagnosis not present

## 2018-04-11 DIAGNOSIS — R911 Solitary pulmonary nodule: Secondary | ICD-10-CM

## 2018-04-12 ENCOUNTER — Other Ambulatory Visit: Payer: Self-pay | Admitting: Family Medicine

## 2018-04-12 DIAGNOSIS — Z794 Long term (current) use of insulin: Secondary | ICD-10-CM | POA: Diagnosis not present

## 2018-04-12 DIAGNOSIS — N182 Chronic kidney disease, stage 2 (mild): Secondary | ICD-10-CM | POA: Diagnosis not present

## 2018-04-12 DIAGNOSIS — E1122 Type 2 diabetes mellitus with diabetic chronic kidney disease: Secondary | ICD-10-CM | POA: Diagnosis not present

## 2018-04-12 LAB — COMPLETE METABOLIC PANEL WITH GFR
AG RATIO: 1.4 (calc) (ref 1.0–2.5)
ALT: 12 U/L (ref 9–46)
AST: 12 U/L (ref 10–35)
Albumin: 4 g/dL (ref 3.6–5.1)
Alkaline phosphatase (APISO): 85 U/L (ref 40–115)
BUN/Creatinine Ratio: 16 (calc) (ref 6–22)
BUN: 22 mg/dL (ref 7–25)
CO2: 23 mmol/L (ref 20–32)
CREATININE: 1.36 mg/dL — AB (ref 0.70–1.18)
Calcium: 10.5 mg/dL — ABNORMAL HIGH (ref 8.6–10.3)
Chloride: 105 mmol/L (ref 98–110)
GFR, EST AFRICAN AMERICAN: 60 mL/min/{1.73_m2} (ref >=60)
GFR, EST NON AFRICAN AMERICAN: 52 mL/min/{1.73_m2} — AB (ref >=60)
GLOBULIN: 2.8 g/dL (ref 1.9–3.7)
Glucose, Bld: 101 mg/dL — ABNORMAL HIGH (ref 65–99)
POTASSIUM: 4.4 mmol/L (ref 3.5–5.3)
SODIUM: 137 mmol/L (ref 135–146)
TOTAL PROTEIN: 6.8 g/dL (ref 6.1–8.1)
Total Bilirubin: 0.3 mg/dL (ref 0.2–1.2)

## 2018-04-12 NOTE — Pre-Procedure Instructions (Addendum)
[name]  04/12/2018      [facility] - [place], [place] - [place]. [name] [place] [address] Phone: [phone] Fax: [phone]    Your procedure is scheduled on May 8  Report to [place] at [place].M.  Call this number if you have problems the morning of surgery:  [phone]   Remember:  Do not eat food or drink liquids after midnight.  Continue all medications as directed by your physician except follow these medication instructions before surgery below   Take these medicines the morning of surgery with A SIP OF WATER  allopurinol (ZYLOPRIM) amLODipine (NORVASC)  cloNIDine (CATAPRES) gabapentin (NEURONTIN)  levothyroxine (SYNTHROID, LEVOTHROID) lubiprostone (AMITIZA metoprolol succinate (TOPROL-XL)  oxyCODONE-acetaminophen (PERCOCET) pantoprazole (PROTONIX)  tamsulosin (FLOMAX)  7 days prior to surgery STOP taking any Aspirin(unless otherwise instructed by your surgeon), Aleve, Naproxen, Ibuprofen, Motrin, Advil, Goody's, BC's, all herbal medications, fish oil, and all vitamins   WHAT DO I DO ABOUT MY DIABETES MEDICATION?   [name] Do not take oral diabetes medicines (pills) the morning of surgery. metFORMIN (GLUCOPHAGE  . THE NIGHT BEFORE SURGERY, take _____25______ units of _Insulin Glargine (LANTUS SOLOSTAR)__________insulin.      . If your CBG is greater than 220 mg/dL, you may take  of your sliding scale (correction) dose of insulin.   How to Manage Your Diabetes Before and After Surgery  Why is it important to control my blood sugar before and after surgery? . Improving blood sugar levels before and after surgery helps healing and can limit problems. . A way of improving blood sugar control is eating a healthy diet by: o  Eating less sugar and carbohydrates o  Increasing activity/exercise o  Talking with your doctor about reaching your blood sugar  goals . High blood sugars (greater than 180 mg/dL) can raise your risk of infections and slow your recovery, so you will need to focus on controlling your diabetes during the weeks before surgery. . Make sure that the doctor who takes care of your diabetes knows about your planned surgery including the date and location.  How do I manage my blood sugar before surgery? . Check your blood sugar at least 4 times a day, starting 2 days before surgery, to make sure that the level is not too high or low. o Check your blood sugar the morning of your surgery when you wake up and every 2 hours until you get to the Short Stay unit. . If your blood sugar is less than 70 mg/dL, you will need to treat for low blood sugar: o Do not take insulin. o Treat a low blood sugar (less than 70 mg/dL) with  cup of clear juice (cranberry or apple), 4 glucose tablets, OR glucose gel. o Recheck blood sugar in 15 minutes after treatment (to make sure it is greater than 70 mg/dL). If your blood sugar is not greater than 70 mg/dL on recheck, call [phone] for further instructions. . Report your blood sugar to the short stay nurse when you get to Short Stay.  . If you are admitted to the hospital after surgery: o Your blood sugar will be checked by the staff and you will probably be given insulin after surgery (instead of oral diabetes medicines) to make sure you have good blood sugar levels. o The goal for blood sugar control after surgery is 80-180 mg/dL.  Do not wear jewelry  Do not wear lotions, powders, or cologne, or deodorant.   Men may shave face and neck.  Do not bring valuables to the hospital.  [facility] is not responsible for any belongings or valuables.  Contacts, dentures or bridgework may not be worn into surgery.  Leave your suitcase in the car.  After surgery it may be brought to your room.  For patients admitted to the hospital, discharge time will be determined by your treatment  team.  Patients discharged the day of surgery will not be allowed to drive home.    Special instructions:   [name]- Preparing For Surgery  Before surgery, you can play an important role. Because skin is not sterile, your skin needs to be as free of germs as possible. You can reduce the number of germs on your skin by washing with CHG (chlorahexidine gluconate) Soap before surgery.  CHG is an antiseptic cleaner which kills germs and bonds with the skin to continue killing germs even after washing.  Please do not use if you have an allergy to CHG or antibacterial soaps. If your skin becomes reddened/irritated stop using the CHG.  Do not shave (including legs and underarms) for at least 48 hours prior to first CHG shower. It is OK to shave your face.  Please follow these instructions carefully.   1. Shower the NIGHT BEFORE SURGERY and the MORNING OF SURGERY with CHG.   2. If you chose to wash your hair, wash your hair first as usual with your normal shampoo.  3. After you shampoo, rinse your hair and body thoroughly to remove the shampoo.  4. Use CHG as you would any other liquid soap. You can apply CHG directly to the skin and wash gently with a scrungie or a clean washcloth.   5. Apply the CHG Soap to your body ONLY FROM THE NECK DOWN.  Do not use on open wounds or open sores. Avoid contact with your eyes, ears, mouth and genitals (private parts). Wash Face and genitals (private parts)  with your normal soap.  6. Wash thoroughly, paying special attention to the area where your surgery will be performed.  7. Thoroughly rinse your body with warm water from the neck down.  8. DO NOT shower/wash with your normal soap after using and rinsing off the CHG Soap.  9. Pat yourself dry with a CLEAN TOWEL.  10. Wear CLEAN PAJAMAS to bed the night before surgery, wear comfortable clothes the morning of surgery  11. Place CLEAN SHEETS on your bed the night of your first shower and DO NOT  SLEEP WITH PETS.    Day of Surgery: Do not apply any deodorants/lotions. Please wear clean clothes to the hospital/surgery center.      Please read over the following fact sheets that you were given.

## 2018-04-13 ENCOUNTER — Telehealth: Payer: Self-pay | Admitting: Medical Oncology

## 2018-04-13 ENCOUNTER — Encounter (HOSPITAL_COMMUNITY)
Admission: RE | Admit: 2018-04-13 | Discharge: 2018-04-13 | Disposition: A | Discharge status: Home / Self Care | Payer: Medicare Other | Source: Ambulatory Visit | Attending: Thoracic Surgery (Cardiothoracic Vascular Surgery) | Admitting: Thoracic Surgery (Cardiothoracic Vascular Surgery)

## 2018-04-13 DIAGNOSIS — I10 Essential (primary) hypertension: Secondary | ICD-10-CM | POA: Diagnosis not present

## 2018-04-13 DIAGNOSIS — J984 Other disorders of lung: Secondary | ICD-10-CM

## 2018-04-13 DIAGNOSIS — Z01812 Encounter for preprocedural laboratory examination: Secondary | ICD-10-CM | POA: Insufficient documentation

## 2018-04-13 DIAGNOSIS — R9431 Abnormal electrocardiogram [ECG] [EKG]: Secondary | ICD-10-CM | POA: Insufficient documentation

## 2018-04-13 DIAGNOSIS — Z01810 Encounter for preprocedural cardiovascular examination: Secondary | ICD-10-CM | POA: Diagnosis not present

## 2018-04-13 LAB — CBC
HEMATOCRIT: 40.4 % (ref 39.0–52.0)
HEMOGLOBIN: 12.7 g/dL — AB (ref 13.0–17.0)
MCH: 23.7 pg — AB (ref 26.0–34.0)
MCHC: 31.4 g/dL (ref 30.0–36.0)
MCV: 75.5 fL — ABNORMAL LOW (ref 78.0–100.0)
Platelets: 194 10*3/uL (ref 150–400)
RBC: 5.35 MIL/uL (ref 4.22–5.81)
RDW: 17.3 % — AB (ref 11.5–15.5)
WBC: 8.2 10*3/uL (ref 4.0–10.5)

## 2018-04-13 LAB — COMPREHENSIVE METABOLIC PANEL
ALK PHOS: 76 U/L (ref 38–126)
ALT: 16 U/L — ABNORMAL LOW (ref 17–63)
ANION GAP: 9 (ref 5–15)
AST: 15 U/L (ref 15–41)
Albumin: 3.5 g/dL (ref 3.5–5.0)
BILIRUBIN TOTAL: 0.2 mg/dL — AB (ref 0.3–1.2)
BUN: 18 mg/dL (ref 6–20)
CALCIUM: 10.5 mg/dL — AB (ref 8.9–10.3)
CO2: 23 mmol/L (ref 22–32)
Chloride: 106 mmol/L (ref 101–111)
Creatinine, Ser: 1.39 mg/dL — ABNORMAL HIGH (ref 0.61–1.24)
GFR calc Af Amer: 57 mL/min — ABNORMAL LOW (ref >60)
GFR calc non Af Amer: 49 mL/min — ABNORMAL LOW (ref >60)
Glucose, Bld: 146 mg/dL — ABNORMAL HIGH (ref 65–99)
POTASSIUM: 4.7 mmol/L (ref 3.5–5.1)
Sodium: 138 mmol/L (ref 135–145)
TOTAL PROTEIN: 7.2 g/dL (ref 6.5–8.1)

## 2018-04-13 LAB — HEMOGLOBIN A1C
EAG (MMOL/L): 9 (calc)
Hgb A1c MFr Bld: 7.3 % of total Hgb — ABNORMAL HIGH (ref <5.7)
MEAN PLASMA GLUCOSE: 163 (calc)

## 2018-04-13 LAB — APTT: aPTT: 31 seconds (ref 24–36)

## 2018-04-13 LAB — PROTIME-INR
INR: 0.97
PROTHROMBIN TIME: 12.8 s (ref 11.4–15.2)

## 2018-04-13 LAB — GLUCOSE, CAPILLARY: GLUCOSE-CAPILLARY: 134 mg/dL — AB (ref 65–99)

## 2018-04-13 NOTE — Telephone Encounter (Signed)
Schedule request sent.

## 2018-04-16 ENCOUNTER — Other Ambulatory Visit: Payer: Self-pay | Admitting: "Endocrinology

## 2018-04-16 ENCOUNTER — Encounter (HOSPITAL_COMMUNITY): Payer: Self-pay | Admitting: Emergency Medicine

## 2018-04-16 ENCOUNTER — Encounter (HOSPITAL_COMMUNITY): Payer: Self-pay | Admitting: Anesthesiology

## 2018-04-17 ENCOUNTER — Inpatient Hospital Stay: Payer: Medicare Other | Admitting: Internal Medicine

## 2018-04-18 ENCOUNTER — Ambulatory Visit (HOSPITAL_COMMUNITY)
Admission: RE | Admit: 2018-04-18 | Discharge: 2018-04-18 | Disposition: A | Discharge status: Home / Self Care | Payer: Medicare Other | Source: Ambulatory Visit | Attending: Thoracic Surgery (Cardiothoracic Vascular Surgery) | Admitting: Thoracic Surgery (Cardiothoracic Vascular Surgery)

## 2018-04-18 ENCOUNTER — Other Ambulatory Visit: Payer: Self-pay

## 2018-04-18 ENCOUNTER — Encounter (HOSPITAL_COMMUNITY): Payer: Self-pay | Admitting: General Practice

## 2018-04-18 ENCOUNTER — Ambulatory Visit (HOSPITAL_COMMUNITY): Payer: Medicare Other

## 2018-04-18 ENCOUNTER — Encounter (HOSPITAL_COMMUNITY)
Admission: RE | Disposition: A | Discharge status: Home / Self Care | Payer: Self-pay | Source: Ambulatory Visit | Attending: Thoracic Surgery (Cardiothoracic Vascular Surgery)

## 2018-04-18 DIAGNOSIS — C3490 Malignant neoplasm of unspecified part of unspecified bronchus or lung: Secondary | ICD-10-CM | POA: Diagnosis not present

## 2018-04-18 DIAGNOSIS — J984 Other disorders of lung: Secondary | ICD-10-CM

## 2018-04-18 DIAGNOSIS — Z539 Procedure and treatment not carried out, unspecified reason: Secondary | ICD-10-CM | POA: Insufficient documentation

## 2018-04-18 DIAGNOSIS — C3412 Malignant neoplasm of upper lobe, left bronchus or lung: Secondary | ICD-10-CM | POA: Diagnosis not present

## 2018-04-18 LAB — GLUCOSE, CAPILLARY
GLUCOSE-CAPILLARY: 122 mg/dL — AB (ref 65–99)
Glucose-Capillary: 107 mg/dL — ABNORMAL HIGH (ref 65–99)

## 2018-04-18 SURGERY — VIDEO BRONCHOSCOPY WITH ENDOBRONCHIAL NAVIGATION
Anesthesia: General

## 2018-04-18 MED ORDER — LACTATED RINGERS IV SOLN
INTRAVENOUS | Status: DC
  Administered 2018-04-18: 13:00:00 via INTRAVENOUS

## 2018-04-18 SURGICAL SUPPLY — 42 items
ADAPTER BRONCH F/PENTAX (ADAPTER) ×4 IMPLANT
ADPR BSCP EDG PNTX (ADAPTER) ×1
BRUSH BIOPSY BRONCH 10 SDTNB (MISCELLANEOUS) IMPLANT
BRUSH BIOPSY BRONCH 10MM SDTNB (MISCELLANEOUS)
BRUSH SUPERTRAX BIOPSY (INSTRUMENTS) IMPLANT
BRUSH SUPERTRAX NDL-TIP CYTO (INSTRUMENTS) ×4 IMPLANT
CANISTER SUCT 3000ML PPV (MISCELLANEOUS) ×4 IMPLANT
CHANNEL WORK EXTEND EDGE 180 (KITS) IMPLANT
CHANNEL WORK EXTEND EDGE 45 (KITS) IMPLANT
CHANNEL WORK EXTEND EDGE 90 (KITS) IMPLANT
CONT SPEC 4OZ CLIKSEAL STRL BL (MISCELLANEOUS) ×8 IMPLANT
COVER BACK TABLE 60X90IN (DRAPES) ×4 IMPLANT
FILTER STRAW FLUID ASPIR (MISCELLANEOUS) IMPLANT
FORCEPS BIOP SUPERTRX PREMAR (INSTRUMENTS) IMPLANT
GAUZE SPONGE 4X4 12PLY STRL (GAUZE/BANDAGES/DRESSINGS) ×4 IMPLANT
GLOVE SURG SIGNA 7.5 PF LTX (GLOVE) ×4 IMPLANT
GOWN STRL REUS W/ TWL XL LVL3 (GOWN DISPOSABLE) ×2 IMPLANT
GOWN STRL REUS W/TWL XL LVL3 (GOWN DISPOSABLE) ×3
KIT CLEAN ENDO COMPLIANCE (KITS) ×4 IMPLANT
KIT PROCEDURE EDGE 180 (KITS) IMPLANT
KIT PROCEDURE EDGE 45 (KITS) IMPLANT
KIT PROCEDURE EDGE 90 (KITS) IMPLANT
KIT TURNOVER KIT B (KITS) ×4 IMPLANT
MARKER SKIN DUAL TIP RULER LAB (MISCELLANEOUS) ×4 IMPLANT
NDL SUPERTRX PREMARK BIOPSY (NEEDLE) IMPLANT
NEEDLE SUPERTRX PREMARK BIOPSY (NEEDLE) IMPLANT
NS IRRIG 1000ML POUR BTL (IV SOLUTION) ×4 IMPLANT
OIL SILICONE PENTAX (PARTS (SERVICE/REPAIRS)) ×4 IMPLANT
PAD ARMBOARD 7.5X6 YLW CONV (MISCELLANEOUS) ×8 IMPLANT
PATCHES PATIENT (LABEL) ×12 IMPLANT
SYR 20CC LL (SYRINGE) ×4 IMPLANT
SYR 20ML ECCENTRIC (SYRINGE) ×4 IMPLANT
SYR 30ML LL (SYRINGE) ×4 IMPLANT
SYR 5ML LL (SYRINGE) ×4 IMPLANT
TOWEL GREEN STERILE (TOWEL DISPOSABLE) ×4 IMPLANT
TOWEL GREEN STERILE FF (TOWEL DISPOSABLE) ×4 IMPLANT
TRAP SPECIMEN MUCOUS 40CC (MISCELLANEOUS) ×4 IMPLANT
TUBE CONNECTING 20'X1/4 (TUBING) ×2
TUBE CONNECTING 20X1/4 (TUBING) ×6 IMPLANT
UNDERPAD 30X30 (UNDERPADS AND DIAPERS) ×4 IMPLANT
VALVE DISPOSABLE (MISCELLANEOUS) ×4 IMPLANT
WATER STERILE IRR 1000ML POUR (IV SOLUTION) ×4 IMPLANT

## 2018-04-18 NOTE — Progress Notes (Signed)
Procedure canceled.  IV removed, site clean, dry, and intact.  Patient taken to main entrance via wheelchair by nurse, nurse tech, and family.

## 2018-04-19 ENCOUNTER — Ambulatory Visit (INDEPENDENT_AMBULATORY_CARE_PROVIDER_SITE_OTHER): Payer: Medicare Other | Admitting: "Endocrinology

## 2018-04-19 ENCOUNTER — Encounter: Payer: Self-pay | Admitting: "Endocrinology

## 2018-04-19 ENCOUNTER — Other Ambulatory Visit: Payer: Self-pay | Admitting: *Deleted

## 2018-04-19 VITALS — BP 120/81 | HR 68 | Ht 72.0 in

## 2018-04-19 DIAGNOSIS — E1122 Type 2 diabetes mellitus with diabetic chronic kidney disease: Secondary | ICD-10-CM

## 2018-04-19 DIAGNOSIS — N182 Chronic kidney disease, stage 2 (mild): Secondary | ICD-10-CM | POA: Diagnosis not present

## 2018-04-19 DIAGNOSIS — E782 Mixed hyperlipidemia: Secondary | ICD-10-CM | POA: Diagnosis not present

## 2018-04-19 DIAGNOSIS — I10 Essential (primary) hypertension: Secondary | ICD-10-CM

## 2018-04-19 DIAGNOSIS — R918 Other nonspecific abnormal finding of lung field: Secondary | ICD-10-CM

## 2018-04-19 DIAGNOSIS — Z794 Long term (current) use of insulin: Secondary | ICD-10-CM

## 2018-04-19 DIAGNOSIS — E039 Hypothyroidism, unspecified: Secondary | ICD-10-CM | POA: Diagnosis not present

## 2018-04-19 MED ORDER — INSULIN GLARGINE 100 UNIT/ML SOLOSTAR PEN: 60.0000 [IU] | PEN_INJECTOR | Freq: Every day | SUBCUTANEOUS | 2 refills | Status: DC

## 2018-04-19 NOTE — Progress Notes (Signed)
Subjective:    Patient ID: [name], male    DOB: 16-Apr-1946,    Past Medical History:  Diagnosis Date  . Arthritis   . Asthma    "hx of"  . Barrett's esophagus    EGD 03/23/2011 & EGD 2/09 bx proven  . BPH (benign prostatic hyperplasia)   . Cancer of parotid gland ([place]) 11/23/12   Adenocarcinoma  . Chronic abdominal pain   . Colon polyp 03/23/2011   tubular adenoma, Dr. Gala Romney  . 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 ([place]) 07/15/2011  . CVA (cerebral infarction) 1998   right sided deficit  . Diverticulosis    TCS 03/23/11 pancolonic diverticula &TCS 5/08, pancolonic diverticula  . DM (diabetes mellitus) ([place])   . Edema of lower extremity 12/21/12   bilateral   . Esotropia of left eye   . GERD (gastroesophageal reflux disease)   . Glaucoma (increased eye pressure)   . Gout   . Gout   . Hemorrhagic colitis 06/06/2012.  [name] Hemorrhoids, internal 03/23/2011   tcs by Dr. Gala Romney  . Hepatitis    esosiniphilic, tx with prednisone  . Hiatal hernia   . HTN (hypertension)   . Hx of radiation therapy 1974   right base of skull area-lymphoma  . Hyperlipidemia   . Lower facial weakness    Right  . Lymphoma ([place]) 1974   XRT at [facility], right base of skull area  . Neuropathy   . Renal insufficiency   . Steatohepatitis    liver biopsy 2009  . Stroke [facility]) 1998   right hemiparesis/plegia   Past Surgical History:  Procedure Laterality Date  . BIOPSY  12/01/2016   Procedure: BIOPSY;  Surgeon: Daneil Dolin, MD;  Location: [facility] ENDO SUITE;  Service: Endoscopy;;  duodenum, gastric, esophagus  . CHOLECYSTECTOMY    . COLONOSCOPY  03/23/11   Dr. Gala Romney  pancolonic diverticula, hemorrhoids, tubular adenoma.. next tcs 03/2016  . COLONOSCOPY WITH PROPOFOL N/A 12/01/2016   inadequate bowel prep precluded exam  . ESOPHAGOGASTRODUODENOSCOPY  02/05/08   goblet cell metaplasia/negative for H.pylori  . ESOPHAGOGASTRODUODENOSCOPY  03/23/11   Dr. Gala Romney, barretts, hiatal hernia  .  ESOPHAGOGASTRODUODENOSCOPY (EGD) WITH PROPOFOL N/A 12/01/2016   Dr. Gala Romney: Large amount of retained gastric contents precluded completion of the stomach. Mucosal changes were found in the stomach. Erosions and somewhat scalloped appearing mucosa present, reactive gastritis/no H pylori. Barrett's esophagus noted, no dysplasia on biopsy. Duodenal biopsies taken as well, benign, no evidence of eosinophilia.  [name] MASS BIOPSY  11/01/2012   Procedure: NECK MASS BIOPSY;  Surgeon: Ascencion Dike, MD;  Location: [facility] ORS;  Service: ENT;  Laterality: Right;  Excisional Bx Right Neck Mass; attempted external jugular cutdown of left side  . PAROTIDECTOMY  11/24/2012   Procedure: PAROTIDECTOMY;  Surgeon: Ascencion Dike, MD;  Location: [place];  Service: ENT;  Laterality: N/A;  Total parotidectomy  . PLEURECTOMY    . right lymph node removal Right    behind right ear  . Right video-assisted thoracic surgery, pleurectomy, and pleurodesis  2011   Social History   Socioeconomic History  . Marital status: Married    Spouse name: Not on file  . Number of children: 1  . Years of education: Not on file  . Highest education level: Not on file  Occupational History  . Occupation: disabled  Social Needs  . Financial resource strain: Not on file  . Food insecurity:    Worry:  Not on file    Inability: Not on file  . Transportation needs:    Medical: Not on file    Non-medical: Not on file  Tobacco Use  . Smoking status: Former Smoker    Packs/day: 3.00    Years: 25.00    Pack years: 75.00    Types: Cigarettes    Last attempt to quit: 03/01/1997    Years since quitting: 21.1  . Smokeless tobacco: Never Used  Substance and Sexual Activity  . Alcohol use: No  . Drug use: No  . Sexual activity: Never  Lifestyle  . Physical activity:    Days per week: Not on file    Minutes per session: Not on file  . Stress: Not on file  Relationships  . Social connections:    Talks on phone: Not on file    Gets together: Not  on file    Attends religious service: Not on file    Active member of club or organization: Not on file    Attends meetings of clubs or organizations: Not on file    Relationship status: Not on file  Other Topics Concern  . Not on file  Social History Narrative  . Not on file   Outpatient Encounter Medications as of 04/19/2018  Medication Sig  . allopurinol (ZYLOPRIM) 300 MG tablet TAKE 1 TABLET BY MOUTH EVERY DAY  . amLODipine (NORVASC) 10 MG tablet TAKE 1 TABLET BY MOUTH DAILY  . cloNIDine (CATAPRES) 0.2 MG tablet TAKE 1 TABLET BY MOUTH THREE TIMES DAILY  . furosemide (LASIX) 40 MG tablet Take 1 tablet (40 mg total) by mouth daily. (Patient taking differently: Take 40 mg by mouth 2 (two) times daily. )  . gabapentin (NEURONTIN) 300 MG capsule TAKE 2 CAPSULES(600 MG) BY MOUTH THREE TIMES DAILY (Patient taking differently: TAKE 2 CAPSULES(600 MG) BY MOUTH TWICE DAILY, MAY TAKE AN ADDITIONAL 600 MG MID DAY AS NEEDED FOR PAIN)  . Insulin Glargine (LANTUS SOLOSTAR) 100 UNIT/ML Solostar Pen Inject 60 Units into the skin at bedtime.  [name] levothyroxine (SYNTHROID, LEVOTHROID) 88 MCG tablet TAKE 1 TABLET(88 MCG) BY MOUTH DAILY  . lisinopril (PRINIVIL,ZESTRIL) 20 MG tablet TAKE 1 TABLET BY MOUTH DAILY  . lubiprostone (AMITIZA) 24 MCG capsule Take 1 capsule (24 mcg total) by mouth 2 (two) times daily with a meal. (Patient taking differently: Take 24 mcg by mouth every other day. )  . metFORMIN (GLUCOPHAGE) 500 MG tablet TAKE 1 TABLET BY MOUTH TWICE DAILY  . metoprolol succinate (TOPROL-XL) 50 MG 24 hr tablet TAKE 1 TABLET BY MOUTH DAILY WITH OR IMMEDIATELY FOLLOWING A MEAL  . ONETOUCH DELICA LANCETS FINE MISC USE AS DIRECTED FOUR TIMES DAILY  . ONETOUCH VERIO test strip TEST FOUR TIMES DAILY  . oxyCODONE-acetaminophen (PERCOCET) 7.5-325 MG tablet Take 1 tablet by mouth every 4 (four) hours as needed. (Patient taking differently: Take 1 tablet by mouth 3 (three) times daily as needed for severe pain. )   . pantoprazole (PROTONIX) 40 MG tablet TAKE 1 TABLET BY MOUTH DAILY  . potassium chloride SA (K-DUR,KLOR-CON) 20 MEQ tablet TAKE 1 TABLET(20 MEQ) BY MOUTH DAILY  . simvastatin (ZOCOR) 20 MG tablet TAKE 1 TABLET BY MOUTH EVERY NIGHT AT BEDTIME  . tamsulosin (FLOMAX) 0.4 MG CAPS capsule TAKE 1 CAPSULE BY MOUTH DAILY  . tiZANidine (ZANAFLEX) 2 MG tablet TAKE 1 TABLET(2 MG) BY MOUTH TWICE DAILY AS NEEDED (Patient taking differently: TAKE 1 TABLET(2 MG) BY MOUTH TWICE DAILY AS NEEDED MUSCLE SPASMS)  .  Triamcinolone Acetonide (TRIAMCINOLONE 0.1 % CREAM : EUCERIN) CREA Apply 1 application topically 2 (two) times daily as needed. (Patient taking differently: Apply 1 application topically 2 (two) times daily as needed for rash. )  . [DISCONTINUED] clotrimazole (LOTRIMIN) 1 % cream Apply 1 application topically 2 (two) times daily. (Patient not taking: Reported on 04/10/2018)  . [DISCONTINUED] clotrimazole-betamethasone (LOTRISONE) cream APPLY EXTERNALLY TO THE AFFECTED AREA TWICE DAILY (Patient not taking: Reported on 04/10/2018)  . [DISCONTINUED] Insulin Glargine (LANTUS SOLOSTAR) 100 UNIT/ML Solostar Pen Inject 50 Units into the skin at bedtime.   No facility-administered encounter medications on file as of 04/19/2018.    ALLERGIES: No Known Allergies VACCINATION STATUS: Immunization History  Administered Date(s) Administered  . Influenza Split 08/24/2012  . Influenza, High Dose Seasonal PF 10/31/2017  . Influenza,inj,Quad PF,6+ Mos 10/09/2013, 09/05/2014, 11/23/2015, 08/16/2016  . Pneumococcal Conjugate-13 01/07/2014  . Pneumococcal Polysaccharide-23 11/25/2012    Diabetes  He presents for his follow-up diabetic visit. He has type 2 diabetes mellitus. Onset time: diagnosed approx at age 28. His disease course has been stable. There are no hypoglycemic associated symptoms. Pertinent negatives for hypoglycemia include no confusion, headaches, pallor or seizures. Pertinent negatives for diabetes  include no chest pain, no fatigue, no polydipsia, no polyphagia, no polyuria and no weakness. Symptoms are stable. Diabetic complications include a CVA. Risk factors for coronary artery disease include diabetes mellitus, dyslipidemia, hypertension, male sex, obesity, sedentary lifestyle and tobacco exposure. Current diabetic treatment includes insulin injections. He is following a generally unhealthy diet. When asked about meal planning, he reported none. He has had a previous visit with a dietitian. He never participates in exercise. His breakfast blood glucose range is generally 140-180 mg/dl. His lunch blood glucose range is generally 140-180 mg/dl. His dinner blood glucose range is generally 140-180 mg/dl. His overall blood glucose range is 140-180 mg/dl. An ACE inhibitor/angiotensin II receptor blocker is being taken.  Hyperlipidemia  This is a chronic problem. The current episode started more than 1 year ago. The problem is uncontrolled. Recent lipid tests were reviewed and are variable. Exacerbating diseases include diabetes and obesity. Pertinent negatives include no chest pain, myalgias or shortness of breath. Risk factors for coronary artery disease include diabetes mellitus, dyslipidemia, hypertension, male sex and a sedentary lifestyle.  Hypertension  This is a chronic problem. The current episode started more than 1 year ago. The problem is controlled. Pertinent negatives include no chest pain, headaches, neck pain, palpitations or shortness of breath. Risk factors for coronary artery disease include dyslipidemia, diabetes mellitus, male gender, sedentary lifestyle and obesity. Past treatments include ACE inhibitors. Hypertensive end-organ damage includes CVA.     Review of Systems  Constitutional: Negative for chills, fatigue, fever and unexpected weight change.  HENT: Negative for dental problem, mouth sores and trouble swallowing.   Eyes: Negative for visual disturbance.  Respiratory:  Negative for cough, choking, chest tightness, shortness of breath and wheezing.   Cardiovascular: Negative for chest pain, palpitations and leg swelling.  Gastrointestinal: Negative for abdominal distention, abdominal pain, constipation, diarrhea, nausea and vomiting.  Endocrine: Negative for polydipsia, polyphagia and polyuria.  Genitourinary: Negative for dysuria, flank pain, hematuria and urgency.  Musculoskeletal: Negative for back pain, gait problem, joint swelling, myalgias and neck pain.  Skin: Negative for pallor, rash and wound.  Neurological: Negative for seizures, weakness, numbness and headaches.  Psychiatric/Behavioral: Negative for confusion, dysphoric mood, hallucinations and suicidal ideas.    Objective:    BP 120/81   Pulse 68  Ht 6' (1.829 m)   BMI 36.21 kg/m   Wt Readings from Last 3 Encounters:  04/18/18 267 lb (121.1 kg)  04/13/18 267 lb (121.1 kg)  04/09/18 267 lb (121.1 kg)    Physical Exam  Constitutional: He is oriented to person, place, and time. He appears well-developed. He is cooperative.  HENT:  Head: Normocephalic and atraumatic.  Eyes: EOM are normal.  Neck: Normal range of motion. Neck supple. No tracheal deviation present. No thyromegaly present.  Cardiovascular: Normal rate. Exam reveals no gallop.  No murmur heard. Pulses:      Dorsalis pedis pulses are 1+ on the right side.       Posterior tibial pulses are 1+ on the right side.  Pulmonary/Chest: Effort normal. No respiratory distress. He has no wheezes.  Abdominal: He exhibits no distension.  Musculoskeletal: He exhibits no edema.  Neurological: He is alert and oriented to person, place, and time. A sensory deficit is present. No cranial nerve deficit. He exhibits normal muscle tone. Gait normal.  Pt is wheelchair bound due to past recurrent CVA  Skin: Skin is warm and dry. No rash noted. No cyanosis. Nails show no clubbing.  Psychiatric: He has a normal mood and affect. His speech is  normal. Judgment normal. Cognition and memory are normal.    CMP  CMP Latest Ref Rng & Units 04/13/2018 04/12/2018 02/14/2018  Glucose 65 - 99 mg/dL 146(H) 101(H) 84  BUN 6 - 20 mg/dL 18 22 18   Creatinine 0.61 - 1.24 mg/dL 1.39(H) 1.36(H) 1.32(H)  Sodium 135 - 145 mmol/L 138 137 140  Potassium 3.5 - 5.1 mmol/L 4.7 4.4 4.3  Chloride 101 - 111 mmol/L 106 105 101  CO2 22 - 32 mmol/L 23 23 30   Calcium 8.9 - 10.3 mg/dL 10.5(H) 10.5(H) 10.8(H)  Total Protein 6.5 - 8.1 g/dL 7.2 6.8 -  Total Bilirubin 0.3 - 1.2 mg/dL 0.2(L) 0.3 -  Alkaline Phos 38 - 126 U/L 76 - -  AST 15 - 41 U/L 15 12 -  ALT 17 - 63 U/L 16(L) 12 -    Diabetic Labs (most recent): Lab Results  Component Value Date   HGBA1C 7.3 (H) 04/12/2018   HGBA1C 6.4 (H) 12/07/2017   HGBA1C 6.7 (H) 08/12/2017   Lipid Panel     Component Value Date/Time   CHOL 151 11/01/2017 0828   TRIG 219 (H) 11/01/2017 0828   HDL 56 11/01/2017 0828   CHOLHDL 2.7 11/01/2017 0828   VLDL 66 (H) 05/04/2017 0931   LDLCALC 66 11/01/2017 0828     Assessment & Plan:   1. Type 2 diabetes mellitus with stage 2 chronic kidney disease and CVA - patient remains at a high risk for more acute and chronic complications of diabetes which include CAD, CVA, CKD, retinopathy, and neuropathy. These are all discussed in detail with the patient.  Patient came with controlled glucose profile, and  recent A1c of 7.3%.   No documented or reported hypoglycemia.   Glucose logs and insulin administration records pertaining to this visit,  to be scanned into patient's records.  Recent labs reviewed.   - I have re-counseled the patient on diet management and weight loss  by adopting a carbohydrate restricted / protein rich  Diet.  -  Suggestion is made for him to avoid simple carbohydrates  from his diet including Cakes, Sweet Desserts / Pastries, Ice Cream, Soda (diet and regular), Sweet Tea, Candies, Chips, Cookies, Store Bought Juices, Alcohol in Excess of  1-2 drinks  a day, Artificial Sweeteners, and "Sugar-free" Products. This will help patient to have stable blood glucose profile and potentially avoid unintended weight gain.   - He admits to dietary indiscretion specifically consumes large quantity of potato chips. - Patient is advised to stick to a routine mealtimes to eat 3 meals  a day and avoid unnecessary snacks ( to snack only to correct hypoglycemia).  - I have approached patient with the following individualized plan to manage diabetes and patient agrees.  -He will benefit from simplified treatment regimen.  I would proceed to increase his Lantus to 60 units units daily at bedtime, continue to hold Humalog for now. - I advised for him to continue monitoring blood glucose 2 times a day-before breakfast and daily at bedtime.  -Patient is encouraged to call clinic for blood glucose levels less than 70 or above 300 mg /dl.  - I will continue  Metformin 531m  by mouth twice a day with meals.   -Patient is not a candidate for SGLT2 inhibitors due to CKD.  - If he maintains this control of diabetes [facility]  - Patient specific target  for A1c; LDL, HDL, Triglycerides, and  Waist Circumference were discussed in detail.  2) BP/HTN: His blood pressure is controlled to target.   Continue current medications including ACEI/ARB. 3) Lipids/HPL: Recent lipid panel showed controlled LDL at 66.  He is advised to continue simvastatin 20 mg p.o. nightly.    4)  Weight/Diet: He is limited on how much he can exercise and carbohydrates information provided.   5) hypothyroidism: -His thyroid function tests are consistent with appropriate replacement. - I advised him to continue   levothyroxine 88 g by mouth every morning.  - We discussed about correct intake of levothyroxine, at fasting, with water, separated by at least 30 minutes from breakfast, and separated by more than 4 hours from calcium, iron, multivitamins, acid reflux  medications (PPIs). -Patient is made aware of the fact that thyroid hormone replacement is needed for life, dose to be adjusted by periodic monitoring of thyroid function tests.  6) Chronic Care/Health Maintenance:  -Patient is on ACEI/ARB and Statin medications and encouraged to continue to follow up with Ophthalmology, Podiatrist at least yearly or according to recommendations, and advised to  stay away from smoking. I have recommended yearly flu vaccine and pneumonia vaccination at least every 5 years; moderate intensity exercise for up to 150 minutes weekly; and  sleep for at least 7 hours a day. Leg elevation when sitting and lying in bed is advised to prevent dependent bilateral lower extremity edema.   -Continues to have mild hypercalcemia, currently at 10.5.  Etiology unclear, possibly immobilization hypercalcemia.  If he returns with calcium greater than 11.5,  he will be considered for Reclast therapy.  - Time spent with the patient: 25 min, of which >50% was spent in reviewing his blood glucose logs , discussing his hypo- and hyper-glycemic episodes, reviewing his current and  previous labs and insulin doses and developing a plan to avoid hypo- and hyper-glycemia. Please refer to Patient Instructions for Blood Glucose Monitoring and Insulin/Medications Dosing Guide"  in media tab for additional information.   - I advised patient to maintain close follow up with Dr. DBuelah Manisfor primary care needs. - Time spent with the patient: 25 min, of which >50% was spent in reviewing his blood glucose logs , discussing his hypo- and hyper-glycemic episodes, reviewing his current and  previous labs and insulin doses and developing a plan to avoid hypo- and hyper-glycemia. Please refer to Patient Instructions for Blood Glucose Monitoring and Insulin/Medications Dosing Guide"  in media tab for additional information. [name] participated in the discussions, expressed understanding, and voiced  agreement with the above plans.  All questions were answered to his satisfaction. he is encouraged to contact clinic should he have any questions or concerns prior to his return visit.  Follow up plan: Return in about 3 months (around 07/20/2018) for meter, and logs.  Glade Lloyd, MD Phone: [phone]  Fax: [phone]  This note was partially dictated with voice recognition software. Similar sounding words can be transcribed inadequately or may not  be corrected upon review.  04/19/2018, 3:17 PM

## 2018-04-19 NOTE — Patient Instructions (Signed)

## 2018-04-20 ENCOUNTER — Inpatient Hospital Stay: Payer: Medicare Other | Attending: Internal Medicine | Admitting: Internal Medicine

## 2018-04-25 ENCOUNTER — Other Ambulatory Visit: Payer: Self-pay

## 2018-04-25 ENCOUNTER — Encounter: Payer: Self-pay | Admitting: Podiatry

## 2018-04-25 ENCOUNTER — Ambulatory Visit: Payer: Medicare Other | Admitting: Podiatry

## 2018-04-25 ENCOUNTER — Encounter (HOSPITAL_COMMUNITY): Payer: Self-pay | Admitting: *Deleted

## 2018-04-25 DIAGNOSIS — B351 Tinea unguium: Secondary | ICD-10-CM | POA: Diagnosis not present

## 2018-04-25 DIAGNOSIS — E1149 Type 2 diabetes mellitus with other diabetic neurological complication: Secondary | ICD-10-CM

## 2018-04-25 DIAGNOSIS — M79676 Pain in unspecified toe(s): Secondary | ICD-10-CM | POA: Diagnosis not present

## 2018-04-25 NOTE — Anesthesia Preprocedure Evaluation (Addendum)
Anesthesia Evaluation  Patient identified by MRN, date of birth, ID band Patient awake    Reviewed: Allergy & Precautions, NPO status , Patient's Chart, lab work & pertinent test results  Airway Mallampati: III  TM Distance: >3 FB Neck ROM: Full    Dental  (+) Dental Advisory Given   Pulmonary asthma , former smoker,    breath sounds clear to auscultation       Cardiovascular hypertension, Pt. on medications and Pt. on home beta blockers  Rhythm:Regular Rate:Normal     Neuro/Psych CVA    GI/Hepatic hiatal hernia, GERD  ,(+) Hepatitis -  Endo/Other  diabetes, Type 2Hypothyroidism   Renal/GU CRFRenal disease     Musculoskeletal  (+) Arthritis ,   Abdominal   Peds  Hematology negative hematology ROS (+)   Anesthesia Other Findings   Reproductive/Obstetrics                            Lab Results  Component Value Date   WBC 8.2 04/13/2018   HGB 12.7 (L) 04/13/2018   HCT 40.4 04/13/2018   MCV 75.5 (L) 04/13/2018   PLT 194 04/13/2018   Lab Results  Component Value Date   CREATININE 1.39 (H) 04/13/2018   BUN 18 04/13/2018   NA 138 04/13/2018   K 4.7 04/13/2018   CL 106 04/13/2018   CO2 23 04/13/2018    Anesthesia Physical Anesthesia Plan  ASA: III  Anesthesia Plan: General   Post-op Pain Management:    Induction: Intravenous  PONV Risk Score and Plan: 2 and Ondansetron, Dexamethasone and Treatment may vary due to age or medical condition  Airway Management Planned: Oral ETT  Additional Equipment: CVP  Intra-op Plan:   Post-operative Plan: Extubation in OR  Informed Consent: I have reviewed the patients History and Physical, chart, labs and discussed the procedure including the risks, benefits and alternatives for the proposed anesthesia with the patient or authorized representative who has indicated his/her understanding and acceptance.   Dental advisory given  Plan  Discussed with:   Anesthesia Plan Comments: (Extremely difficult IV access. Multiple failed attempts at PIV placement. Central line placed for access. )       Anesthesia Quick Evaluation

## 2018-04-25 NOTE — Progress Notes (Signed)
Spoke with pt's wife, [name] for pre-op call. DPR on file. Pt was here on 04/18/18 and surgery was cancelled. [name] states that pt's medications, medical and surgical hx have not changed. Pt is a type 2 diabetic. Last A1C was 7.3 on 04/12/18. She states his fasting blood sugar has been running between 120-145. Instructed her to have pt take 1/2 of his regular dose of Lantus Insulin this evening (to take 30 units).  Instructed her to have pt check his blood sugar in the AM when he gets up. If blood sugar is >220 take 1/2 of usual correction dose of Novolog insulin. If blood sugar is 70 or below, treat with 1/2 cup of clear juice (apple or cranberry) and recheck blood sugar 15 minutes after drinking juice. [name] voiced understanding.

## 2018-04-25 NOTE — Progress Notes (Signed)
Patient ID: [name], male   DOB: 1946-09-06, 72 y.o.   MRN: [id number]  Complaint:  Visit Type: Patient returns to my office for continued preventative foot care services. Complaint: Patient states" my nails have grown long and thick and become painful to walk and wear shoes" Patient has been diagnosed with DM with  neuropathy.  Post CVA with significant swelling both feet.[name] He presents for preventative foot care services. No changes to ROS.    Podiatric Exam: Vascular: dorsalis pedis and posterior tibial pulses are not palpable due to swelling both feet.. Capillary return is immediate. Temperature gradient is WNL. Skin turgor WNL  Sensorium: Diminished  Semmes Weinstein monofilament test. Normal tactile sensation. Nail Exam: Pt has thick disfigured discolored nails with subungual debris noted bilateral entire nail hallux through fifth toenails.   Ulcer Exam: There is no evidence of ulcer or pre-ulcerative changes or infection. Orthopedic Exam: Muscle tone and strength are WNL. No limitations in general ROM. No crepitus or effusions noted. Foot type and digits show no abnormalities. Bony prominences are unremarkable. Skin: No Porokeratosis. No infection or ulcers  Diagnosis:  Tinea unguium, Pain in right toe, pain in left toes  Treatment & Plan Procedures and Treatment: Consent by patient was obtained for treatment procedures. The patient understood the discussion of treatment and procedures well. All questions were answered thoroughly reviewed. Debridement of mycotic and hypertrophic toenails, 1 through 5 bilateral and clearing of subungual debris.  ABN signed for 2019. Return Visit-Office Procedure: Patient instructed to return to the office for a follow up visit 3 months for continued evaluation and treatment.   Gardiner Barefoot DPM

## 2018-04-26 ENCOUNTER — Ambulatory Visit (HOSPITAL_COMMUNITY): Payer: Medicare Other

## 2018-04-26 ENCOUNTER — Encounter (HOSPITAL_COMMUNITY): Payer: Self-pay | Admitting: *Deleted

## 2018-04-26 ENCOUNTER — Ambulatory Visit (HOSPITAL_COMMUNITY): Payer: Medicare Other | Admitting: Certified Registered"

## 2018-04-26 ENCOUNTER — Encounter (HOSPITAL_COMMUNITY)
Admission: RE | Disposition: A | Discharge status: Home / Self Care | Payer: Self-pay | Source: Ambulatory Visit | Attending: Thoracic Surgery (Cardiothoracic Vascular Surgery)

## 2018-04-26 ENCOUNTER — Ambulatory Visit (HOSPITAL_COMMUNITY)
Admission: RE | Admit: 2018-04-26 | Discharge: 2018-04-26 | Disposition: A | Discharge status: Home / Self Care | Payer: Medicare Other | Source: Ambulatory Visit | Attending: Thoracic Surgery (Cardiothoracic Vascular Surgery) | Admitting: Thoracic Surgery (Cardiothoracic Vascular Surgery)

## 2018-04-26 DIAGNOSIS — E1140 Type 2 diabetes mellitus with diabetic neuropathy, unspecified: Secondary | ICD-10-CM | POA: Insufficient documentation

## 2018-04-26 DIAGNOSIS — C3412 Malignant neoplasm of upper lobe, left bronchus or lung: Secondary | ICD-10-CM | POA: Insufficient documentation

## 2018-04-26 DIAGNOSIS — Z419 Encounter for procedure for purposes other than remedying health state, unspecified: Secondary | ICD-10-CM

## 2018-04-26 DIAGNOSIS — Z923 Personal history of irradiation: Secondary | ICD-10-CM | POA: Diagnosis not present

## 2018-04-26 DIAGNOSIS — J449 Chronic obstructive pulmonary disease, unspecified: Secondary | ICD-10-CM | POA: Insufficient documentation

## 2018-04-26 DIAGNOSIS — Z79890 Hormone replacement therapy (postmenopausal): Secondary | ICD-10-CM | POA: Insufficient documentation

## 2018-04-26 DIAGNOSIS — N400 Enlarged prostate without lower urinary tract symptoms: Secondary | ICD-10-CM | POA: Diagnosis not present

## 2018-04-26 DIAGNOSIS — E785 Hyperlipidemia, unspecified: Secondary | ICD-10-CM | POA: Insufficient documentation

## 2018-04-26 DIAGNOSIS — Z87891 Personal history of nicotine dependence: Secondary | ICD-10-CM | POA: Diagnosis not present

## 2018-04-26 DIAGNOSIS — R918 Other nonspecific abnormal finding of lung field: Secondary | ICD-10-CM | POA: Diagnosis not present

## 2018-04-26 DIAGNOSIS — I10 Essential (primary) hypertension: Secondary | ICD-10-CM | POA: Insufficient documentation

## 2018-04-26 DIAGNOSIS — Z8589 Personal history of malignant neoplasm of other organs and systems: Secondary | ICD-10-CM | POA: Diagnosis not present

## 2018-04-26 DIAGNOSIS — R848 Other abnormal findings in specimens from respiratory organs and thorax: Secondary | ICD-10-CM | POA: Diagnosis not present

## 2018-04-26 DIAGNOSIS — Z8572 Personal history of non-Hodgkin lymphomas: Secondary | ICD-10-CM | POA: Insufficient documentation

## 2018-04-26 DIAGNOSIS — Z993 Dependence on wheelchair: Secondary | ICD-10-CM | POA: Diagnosis not present

## 2018-04-26 DIAGNOSIS — Z79899 Other long term (current) drug therapy: Secondary | ICD-10-CM | POA: Insufficient documentation

## 2018-04-26 DIAGNOSIS — M109 Gout, unspecified: Secondary | ICD-10-CM | POA: Insufficient documentation

## 2018-04-26 DIAGNOSIS — J9811 Atelectasis: Secondary | ICD-10-CM | POA: Diagnosis not present

## 2018-04-26 DIAGNOSIS — C3411 Malignant neoplasm of upper lobe, right bronchus or lung: Secondary | ICD-10-CM | POA: Diagnosis not present

## 2018-04-26 DIAGNOSIS — N183 Chronic kidney disease, stage 3 (moderate): Secondary | ICD-10-CM | POA: Diagnosis not present

## 2018-04-26 DIAGNOSIS — Z9889 Other specified postprocedural states: Secondary | ICD-10-CM

## 2018-04-26 DIAGNOSIS — Z794 Long term (current) use of insulin: Secondary | ICD-10-CM | POA: Diagnosis not present

## 2018-04-26 DIAGNOSIS — K219 Gastro-esophageal reflux disease without esophagitis: Secondary | ICD-10-CM | POA: Insufficient documentation

## 2018-04-26 DIAGNOSIS — I129 Hypertensive chronic kidney disease with stage 1 through stage 4 chronic kidney disease, or unspecified chronic kidney disease: Secondary | ICD-10-CM | POA: Diagnosis not present

## 2018-04-26 DIAGNOSIS — I69351 Hemiplegia and hemiparesis following cerebral infarction affecting right dominant side: Secondary | ICD-10-CM | POA: Diagnosis not present

## 2018-04-26 DIAGNOSIS — J439 Emphysema, unspecified: Secondary | ICD-10-CM | POA: Diagnosis not present

## 2018-04-26 DIAGNOSIS — Z79891 Long term (current) use of opiate analgesic: Secondary | ICD-10-CM | POA: Diagnosis not present

## 2018-04-26 DIAGNOSIS — E1122 Type 2 diabetes mellitus with diabetic chronic kidney disease: Secondary | ICD-10-CM | POA: Diagnosis not present

## 2018-04-26 HISTORY — PX: VIDEO BRONCHOSCOPY WITH ENDOBRONCHIAL NAVIGATION: SHX6175

## 2018-04-26 LAB — GLUCOSE, CAPILLARY
Glucose-Capillary: 123 mg/dL — ABNORMAL HIGH (ref 65–99)
Glucose-Capillary: 114 mg/dL — ABNORMAL HIGH (ref 65–99)

## 2018-04-26 SURGERY — VIDEO BRONCHOSCOPY WITH ENDOBRONCHIAL NAVIGATION
Anesthesia: General

## 2018-04-26 MED ORDER — PROPOFOL 500 MG/50ML IV EMUL
INTRAVENOUS | Status: DC | PRN
  Administered 2018-04-26: 75 ug/kg/min via INTRAVENOUS

## 2018-04-26 MED ORDER — PHENYLEPHRINE 40 MCG/ML (10ML) SYRINGE FOR IV PUSH (FOR BLOOD PRESSURE SUPPORT)
PREFILLED_SYRINGE | INTRAVENOUS | Status: DC | PRN
  Administered 2018-04-26 (×2): 80 ug via INTRAVENOUS

## 2018-04-26 MED ORDER — SUGAMMADEX SODIUM 200 MG/2ML IV SOLN
INTRAVENOUS | Status: DC | PRN
  Administered 2018-04-26: 240 mg via INTRAVENOUS

## 2018-04-26 MED ORDER — EPHEDRINE SULFATE-NACL 50-0.9 MG/10ML-% IV SOSY
PREFILLED_SYRINGE | INTRAVENOUS | Status: DC | PRN
  Administered 2018-04-26: 5 mg via INTRAVENOUS

## 2018-04-26 MED ORDER — PROPOFOL 10 MG/ML IV BOLUS
INTRAVENOUS | Status: DC | PRN
  Administered 2018-04-26: 130 mg via INTRAVENOUS

## 2018-04-26 MED ORDER — PHENYLEPHRINE HCL 10 MG/ML IJ SOLN
INTRAVENOUS | Status: DC | PRN
  Administered 2018-04-26: 20 ug/min via INTRAVENOUS

## 2018-04-26 MED ORDER — LACTATED RINGERS IV SOLN
INTRAVENOUS | Status: DC
  Administered 2018-04-26: 09:00:00 via INTRAVENOUS

## 2018-04-26 MED ORDER — LIDOCAINE 2% (20 MG/ML) 5 ML SYRINGE
INTRAMUSCULAR | Status: DC | PRN
  Administered 2018-04-26: 60 mg via INTRAVENOUS

## 2018-04-26 MED ORDER — 0.9 % SODIUM CHLORIDE (POUR BTL) OPTIME
TOPICAL | Status: DC | PRN
  Administered 2018-04-26: 1000 mL

## 2018-04-26 MED ORDER — FENTANYL CITRATE (PF) 100 MCG/2ML IJ SOLN: 25.0000 ug | INTRAMUSCULAR | Status: DC | PRN

## 2018-04-26 MED ORDER — ONDANSETRON HCL 4 MG/2ML IJ SOLN
INTRAMUSCULAR | Status: DC | PRN
  Administered 2018-04-26: 4 mg via INTRAVENOUS

## 2018-04-26 MED ORDER — FENTANYL CITRATE (PF) 250 MCG/5ML IJ SOLN
INTRAMUSCULAR | Status: DC | PRN
  Administered 2018-04-26 (×2): 75 ug via INTRAVENOUS

## 2018-04-26 MED ORDER — ROCURONIUM BROMIDE 10 MG/ML (PF) SYRINGE
PREFILLED_SYRINGE | INTRAVENOUS | Status: DC | PRN
  Administered 2018-04-26: 10 mg via INTRAVENOUS
  Administered 2018-04-26: 50 mg via INTRAVENOUS

## 2018-04-26 MED ORDER — DEXAMETHASONE SODIUM PHOSPHATE 10 MG/ML IJ SOLN
INTRAMUSCULAR | Status: DC | PRN
  Administered 2018-04-26: 10 mg via INTRAVENOUS

## 2018-04-26 MED ORDER — MIDAZOLAM HCL 2 MG/2ML IJ SOLN
INTRAMUSCULAR | Status: AC
  Filled 2018-04-26: qty 2

## 2018-04-26 MED ORDER — EPINEPHRINE PF 1 MG/ML IJ SOLN
INTRAMUSCULAR | Status: DC | PRN
  Administered 2018-04-26: 1 mg via ENDOTRACHEOPULMONARY

## 2018-04-26 MED ORDER — EPINEPHRINE PF 1 MG/ML IJ SOLN
INTRAMUSCULAR | Status: AC
  Filled 2018-04-26: qty 1

## 2018-04-26 MED ORDER — GLYCOPYRROLATE 0.2 MG/ML IV SOSY
PREFILLED_SYRINGE | INTRAVENOUS | Status: DC | PRN
  Administered 2018-04-26: .2 mg via INTRAVENOUS

## 2018-04-26 MED ORDER — PROPOFOL 10 MG/ML IV BOLUS
INTRAVENOUS | Status: AC
  Filled 2018-04-26: qty 40

## 2018-04-26 MED ORDER — FENTANYL CITRATE (PF) 250 MCG/5ML IJ SOLN
INTRAMUSCULAR | Status: AC
  Filled 2018-04-26: qty 5

## 2018-04-26 SURGICAL SUPPLY — 38 items
ADAPTER BRONCH F/PENTAX (ADAPTER) ×3 IMPLANT
ADPR BSCP EDG PNTX (ADAPTER) ×1
BRUSH SUPERTRAX BIOPSY (INSTRUMENTS) ×2 IMPLANT
BRUSH SUPERTRAX NDL-TIP CYTO (INSTRUMENTS) ×3 IMPLANT
CANISTER SUCT 3000ML PPV (MISCELLANEOUS) ×3 IMPLANT
CONT SPEC 4OZ CLIKSEAL STRL BL (MISCELLANEOUS) ×6 IMPLANT
COVER BACK TABLE 60X90IN (DRAPES) ×3 IMPLANT
FILTER STRAW FLUID ASPIR (MISCELLANEOUS) ×2 IMPLANT
FORCEPS BIOP SUPERTRX PREMAR (INSTRUMENTS) ×2 IMPLANT
GAUZE SPONGE 4X4 12PLY STRL (GAUZE/BANDAGES/DRESSINGS) ×3 IMPLANT
GLOVE BIOGEL PI IND STRL 6.5 (GLOVE) IMPLANT
GLOVE BIOGEL PI INDICATOR 6.5 (GLOVE) ×2
GLOVE SURG SIGNA 7.5 PF LTX (GLOVE) ×3 IMPLANT
GOWN STRL REUS W/ TWL XL LVL3 (GOWN DISPOSABLE) ×1 IMPLANT
GOWN STRL REUS W/TWL XL LVL3 (GOWN DISPOSABLE) ×3
KIT CLEAN ENDO COMPLIANCE (KITS) ×3 IMPLANT
KIT PROCEDURE EDGE 180 (KITS) ×2 IMPLANT
KIT TURNOVER KIT B (KITS) ×3 IMPLANT
MARKER SKIN DUAL TIP RULER LAB (MISCELLANEOUS) ×3 IMPLANT
NDL SUPERTRX PREMARK BIOPSY (NEEDLE) IMPLANT
NEEDLE SUPERTRX PREMARK BIOPSY (NEEDLE) ×3 IMPLANT
NS IRRIG 1000ML POUR BTL (IV SOLUTION) ×3 IMPLANT
OIL SILICONE PENTAX (PARTS (SERVICE/REPAIRS)) ×3 IMPLANT
PAD ARMBOARD 7.5X6 YLW CONV (MISCELLANEOUS) ×6 IMPLANT
PATCHES PATIENT (LABEL) ×9 IMPLANT
SYR 20CC LL (SYRINGE) ×3 IMPLANT
SYR 20ML ECCENTRIC (SYRINGE) ×5 IMPLANT
SYR 30ML LL (SYRINGE) ×3 IMPLANT
SYR 3ML LL SCALE MARK (SYRINGE) ×2 IMPLANT
SYR 5ML LL (SYRINGE) ×3 IMPLANT
TOWEL GREEN STERILE (TOWEL DISPOSABLE) ×3 IMPLANT
TOWEL GREEN STERILE FF (TOWEL DISPOSABLE) ×3 IMPLANT
TRAP SPECIMEN MUCOUS 40CC (MISCELLANEOUS) ×3 IMPLANT
TUBE CONNECTING 20'X1/4 (TUBING) ×2
TUBE CONNECTING 20X1/4 (TUBING) ×4 IMPLANT
UNDERPAD 30X30 (UNDERPADS AND DIAPERS) ×3 IMPLANT
VALVE DISPOSABLE (MISCELLANEOUS) ×3 IMPLANT
WATER STERILE IRR 1000ML POUR (IV SOLUTION) ×3 IMPLANT

## 2018-04-26 NOTE — Transfer of Care (Signed)
Immediate Anesthesia Transfer of Care Note  Patient: [name]  Procedure(s) Performed: VIDEO BRONCHOSCOPY WITH ENDOBRONCHIAL NAVIGATION (N/A )  Patient Location: PACU  Anesthesia Type:General  Level of Consciousness: awake and alert   Airway & Oxygen Therapy: Patient Spontanous Breathing and Patient connected to nasal cannula oxygen  Post-op Assessment: Report given to RN and Post -op Vital signs reviewed and stable  Post vital signs: Reviewed and stable  Last Vitals:  Vitals Value Taken Time  BP 137/89 04/26/2018 10:25 AM  Temp 36.2 C 04/26/2018 10:25 AM  Pulse 88 04/26/2018 10:27 AM  Resp 18 04/26/2018 10:27 AM  SpO2 100 % 04/26/2018 10:27 AM  Vitals shown include unvalidated device data.  Last Pain:  Vitals:   04/26/18 0607  TempSrc: Oral         Complications: No apparent anesthesia complications

## 2018-04-26 NOTE — Interval H&P Note (Signed)
History and Physical Interval Note:  04/26/2018 7:54 AM  [name]  has presented today for surgery, with the diagnosis of LUL MASS  The various methods of treatment have been discussed with the patient and family. After consideration of risks, benefits and other options for treatment, the patient has consented to  Procedure(s): [place] (N/A) as a surgical intervention .  The patient's history has been reviewed, patient examined, no change in status, stable for surgery.  I have reviewed the patient's chart and labs.  Questions were answered to the patient's satisfaction.     Melrose Nakayama

## 2018-04-26 NOTE — Anesthesia Procedure Notes (Signed)
Procedure Name: Intubation Date/Time: 04/26/2018 9:03 AM Performed by: Imagene Riches, CRNA Pre-anesthesia Checklist: Patient identified, Emergency Drugs available, Suction available and Patient being monitored Patient Re-evaluated:Patient Re-evaluated prior to induction Oxygen Delivery Method: Circle System Utilized Preoxygenation: Pre-oxygenation with 100% oxygen Induction Type: IV induction Ventilation: Mask ventilation without difficulty and Oral airway inserted - appropriate to patient size Laryngoscope Size: Miller and 3 Grade View: Grade II Tube type: Oral Tube size: 8.5 mm Number of attempts: 1 Airway Equipment and Method: Stylet and Oral airway Placement Confirmation: ETT inserted through vocal cords under direct vision,  positive ETCO2 and breath sounds checked- equal and bilateral Secured at: 23 cm Tube secured with: Tape Dental Injury: Teeth and Oropharynx as per pre-operative assessment and Injury to lip  Comments: Small upper lip laceration.

## 2018-04-26 NOTE — Brief Op Note (Signed)
04/26/2018  10:34 AM  PATIENT:  [name]  72 y.o. male  PRE-OPERATIVE DIAGNOSIS:  LUL MASS  POST-OPERATIVE DIAGNOSIS:   ADENOCARCINOMA LEFT UPPER LOBE  PROCEDURE:  Procedure(s): VIDEO BRONCHOSCOPY WITH ENDOBRONCHIAL NAVIGATION (N/A)- needle aspirations, brushings and transbronchial biopsies  SURGEON:  Surgeon(s) and Role:    * Melrose Nakayama, MD - Primary  PHYSICIAN ASSISTANT: none  ASSISTANTS: none   ANESTHESIA:   general  EBL:  10 mL   BLOOD ADMINISTERED:none  DRAINS: none   LOCAL MEDICATIONS USED:  NONE  SPECIMEN:  Source of Specimen:  LUL mass  DISPOSITION OF SPECIMEN:  PATHOLOGY  COUNTS:  NO endoscopic  TOURNIQUET:  * No tourniquets in log *  DICTATION: .Other Dictation: Dictation Number -  PLAN OF CARE: Discharge to home after PACU  PATIENT DISPOSITION:  PACU - hemodynamically stable.   Delay start of Pharmacological VTE agent (>24hrs) due to surgical blood loss or risk of bleeding: not applicable

## 2018-04-26 NOTE — Progress Notes (Signed)
Attempted to start IV times 2. CRNA called for IV start and labs.

## 2018-04-26 NOTE — Discharge Instructions (Addendum)
Do NOT drive or engage in heavy physical activity for 24 hours.  You may resume normal activities tomorrow.  You may cough up small amounts of blood over the next few days. Call if you cough up more than 2 tablespoons of blood.  You may use acetaminophen (Tylenol) if needed for discomfort. You may use an over the counter cough medication if needed.  Follow up with Drs. Isidore Moos and [place].  Call [phone] if you develop chest pain, shortness of breath, fever > 101 F or cough up more than 2 tablespoons of blood.

## 2018-04-26 NOTE — Anesthesia Postprocedure Evaluation (Signed)
Anesthesia Post Note  Patient: [name]  Procedure(s) Performed: VIDEO BRONCHOSCOPY WITH ENDOBRONCHIAL NAVIGATION (N/A )     Patient location during evaluation: PACU Anesthesia Type: General Level of consciousness: awake and alert Pain management: pain level controlled Vital Signs Assessment: post-procedure vital signs reviewed and stable Respiratory status: spontaneous breathing, nonlabored ventilation, respiratory function stable and patient connected to nasal cannula oxygen Cardiovascular status: blood pressure returned to baseline and stable Postop Assessment: no apparent nausea or vomiting Anesthetic complications: no    Last Vitals:  Vitals:   04/26/18 1025 04/26/18 1038  BP: 137/89 (!) 142/91  Pulse: 86 80  Resp: 15 (!) 21  Temp: (!) 36.2 C   SpO2: 100% 100%    Last Pain:  Vitals:   04/26/18 1025  TempSrc:   PainSc: 0-No pain                 Tiajuana Amass

## 2018-04-26 NOTE — Anesthesia Procedure Notes (Signed)
Central Venous Catheter Insertion Performed by: Suzette Battiest, MD, anesthesiologist Start/End5/16/2019 8:35 AM, 04/26/2018 8:45 AM Patient location: Pre-op. Preanesthetic checklist: patient identified, IV checked, site marked, risks and benefits discussed, surgical consent, monitors and equipment checked, pre-op evaluation, timeout performed and anesthesia consent Position: Trendelenburg Lidocaine 1% used for infiltration and patient sedated Hand hygiene performed , maximum sterile barriers used  and Seldinger technique used Catheter size: 8 Fr Total catheter length 16. Central line was placed.Double lumen Procedure performed using ultrasound guided technique. Ultrasound Notes:anatomy identified, needle tip was noted to be adjacent to the nerve/plexus identified, no ultrasound evidence of intravascular and/or intraneural injection and image(s) printed for medical record Attempts: 1 Following insertion, dressing applied, line sutured and Biopatch. Post procedure assessment: blood return through all ports  Patient tolerated the procedure well with no immediate complications.

## 2018-04-27 ENCOUNTER — Encounter (HOSPITAL_COMMUNITY): Payer: Self-pay | Admitting: Thoracic Surgery (Cardiothoracic Vascular Surgery)

## 2018-04-27 LAB — ACID FAST SMEAR (AFB, MYCOBACTERIA): Acid Fast Smear: NEGATIVE

## 2018-04-27 NOTE — Op Note (Signed)
NAME: [name], DESILETS MEDICAL RECORD XB:[id number] ACCOUNT [id number] DATE OF BIRTH:1946/05/14 FACILITY: [facility] LOCATION: [facility] PHYSICIAN:Tanvir Hipple Chaya Jan, MD  OPERATIVE REPORT  DATE OF PROCEDURE:  04/26/2018  PREOPERATIVE DIAGNOSIS:  Left upper lobe mass.  POSTOPERATIVE DIAGNOSIS:  Adenocarcinoma, left upper lobe.  PROCEDURES:  Electromagnetic navigational bronchoscopy with needle aspirations, brushings and transbronchial biopsies.  SURGEON:   Modesto Charon, MD  ASSISTANT:  None.  ANESTHESIA:  General.  FINDINGS:  Initial stains negative.  Second set of brushings positive for adenocarcinoma.  CLINICAL NOTE:  The patient is a 72 year old gentleman with numerous medical problems and a history of an adenocarcinoma of the parotid gland.  He also has a history of tobacco abuse.  A recent CT of the neck showed a partially visualized left upper lobe  lung mass.  A PET CT showed a hypermetabolic 3.6 cm spiculated left upper lobe mass.  He was seen in consultation by Dr. Earlie Server and then referred to Dr. Isidore Moos for radiation.  She wished to have a biopsy performed prior to initiating radiation.  He  was not a candidate for a CT-guided biopsy due to extensive bullous disease around the tumor.  He was advised to undergo navigational bronchoscopy.  The indications, risks, benefits, alternatives and limitations of the study were discussed in detail with  the patient.  He accepted the risks and agreed to proceed.    DESCRIPTION OF PROCEDURE:  Preoperative planning was done with two separate approaches to the left upper lobe mass mapped.  The patient was brought to the operating room on 04/26/2018.  He had induction of general anesthesia and was intubated.  A timeout was performed.  Flexible fiberoptic bronchoscopy was performed via the endotracheal tube.  There were thick yellowish  secretions in both sides of the bronchial tree.  These were cleared with saline.  There was normal  endobronchial anatomy and no endobronchial lesions seen to the level of the subsegmental bronchi.  Locatable guide for navigation was placed.  The bronchoscope was advanced into the left upper lobe bronchus and the appropriate subsegmental bronchus was cannulated.  Locatable guide was navigated to within a centimeter of the center of the lesion with  good alignment and sampling was performed.  All sampling was done with fluoroscopy.  Three needle aspirations were performed followed by 3 passes with the needle brush.  The locatable guide was reinserted periodically to ensure continued good alignment  with the nodule.  These specimens were sent for quick prep.  While awaiting those results multiple biopsies were obtained.  The catheter then was navigated into the second bronchus selected for approach to the lesion and this appeared more centered on the lesion.  The initial stains came back negative for tumor.  Brushings were repeated and these came back showing nonsmall  cell carcinoma, probably a well-differentiated adenocarcinoma.  Multiple biopsies were taken from the second approach.  These were all sent for permanent pathology.  There was some bleeding with biopsies.  Dilute epinephrine was applied.  With saline  irrigation, the blood cleared and there was no ongoing bleeding.  Fluoroscopy showed no signs of a pneumothorax.  Total fluoroscopy time was 5 minutes.    The patient then was extubated in the operating room and taken to the [facility] in good condition.  AN/NUANCE  D:04/26/2018 T:04/27/2018 JOB:[id number]

## 2018-04-30 ENCOUNTER — Encounter: Payer: Medicare Other | Admitting: Family Medicine

## 2018-04-30 ENCOUNTER — Encounter: Payer: Self-pay | Admitting: Radiation Oncology

## 2018-05-03 NOTE — Progress Notes (Signed)
Thoracic Location of Tumor / Histology:  04/26/18 Diagnosis Lung, biopsy, Left upper lobe - NON-SMALL CELL CARCINOMA, SEE COMMENT.  Patient presented months ago with symptoms of:  Dr. Julien Nordmann documents 03/27/18: He was seen recently by his primary care physician complaining of pain on the right side of the face near the ear. 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 There was partially visualized 4.0 cm left upper lobe lung mass concerning for primary lung malignancy.  Biopsies of left upper lobe revealed: non small cell carcinoma.   Tobacco/Marijuana/Snuff/ETOH use: He is a former smoker. He quit in 1998.  Past/Anticipated interventions by cardiothoracic surgery, if any:  Dr. Roxan Hockey 04/26/18 PROCEDURES:  Electromagnetic navigational bronchoscopy with needle aspirations, brushings and transbronchial biopsies. SURGEON:   Modesto Charon, MD   Past/Anticipated interventions by medical oncology, if any:  03/27/18 Dr. Julien Nordmann PLAN:I had a lengthy discussion with the patient and his family today about his current disease stage, prognosis and treatment options. I personally and independently reviewed the PET scan images and discussed the results and showed the images to the patient and his family. I recommended for the patient to have a CT-guided core biopsy of the left upper lobe lung mass for confirmation of tissue diagnosis. I do not think the patient is a good surgical candidate because of his comorbidities and performance status. I would refer the patient back to Dr. Isidore Moos for consideration of curative radiotherapy to the left upper lobe lung mass after the tissue diagnosis is confirmed. We will continue to monitor the left adrenal gland nodule closely on upcoming imaging studies. I will see  him back for follow-up visit in 3 weeks for reevaluation and more detailed discussion of his treatment options. I will complete the staging work-up by ordering MRI of the brain to rule out brain metastasis  --No new appointment scheduled at this time.     Signs/Symptoms  Weight changes, if any: He has not lost weight.   Respiratory complaints, if any: He denies  Hemoptysis, if any: No  Pain issues, if any:  He reports pain a 7/10 to his right abdomen area. He also reports throat pain since having his biopsy on 04/26/18.   SAFETY ISSUES:  Prior radiation? Per Dr.Squire's note from 12/26/12 during consult for a parotid neoplasm: He received radiotherapy to either the neck or the base of skull at [facility] in [place] for lymphoma. He does not believe he received chemotherapy. He believes he received about 3 weeks of radiation. This was on the right side. He has not had recurrence of lymphoma since then.  Pacemaker/ICD? No  Possible current pregnancy? N/A  Is the patient on methotrexate? No  Current Complaints / other details:     BP 97/69   Pulse 80   Temp 98.5 F (36.9 C)   Resp 20   Ht 6' (1.829 m)   SpO2 95% Comment: room air  BMI 36.21 kg/m

## 2018-05-05 ENCOUNTER — Other Ambulatory Visit: Payer: Self-pay | Admitting: Internal Medicine

## 2018-05-05 ENCOUNTER — Other Ambulatory Visit: Payer: Self-pay | Admitting: Family Medicine

## 2018-05-11 ENCOUNTER — Other Ambulatory Visit: Payer: Self-pay

## 2018-05-11 ENCOUNTER — Encounter: Payer: Self-pay | Admitting: Radiation Oncology

## 2018-05-11 ENCOUNTER — Ambulatory Visit
Admission: RE | Admit: 2018-05-11 | Discharge: 2018-05-11 | Disposition: A | Discharge status: Home / Self Care | Payer: Medicare Other | Source: Ambulatory Visit | Attending: Radiation Oncology | Admitting: Radiation Oncology

## 2018-05-11 ENCOUNTER — Other Ambulatory Visit: Payer: Self-pay | Admitting: Radiation Oncology

## 2018-05-11 VITALS — BP 97/69 | HR 80 | Temp 98.5°F | Resp 20 | Ht 72.0 in

## 2018-05-11 DIAGNOSIS — Q433 Congenital malformations of intestinal fixation: Secondary | ICD-10-CM | POA: Diagnosis not present

## 2018-05-11 DIAGNOSIS — R1011 Right upper quadrant pain: Secondary | ICD-10-CM | POA: Diagnosis not present

## 2018-05-11 DIAGNOSIS — Z85818 Personal history of malignant neoplasm of other sites of lip, oral cavity, and pharynx: Secondary | ICD-10-CM | POA: Diagnosis not present

## 2018-05-11 DIAGNOSIS — K3184 Gastroparesis: Secondary | ICD-10-CM | POA: Insufficient documentation

## 2018-05-11 DIAGNOSIS — Z79899 Other long term (current) drug therapy: Secondary | ICD-10-CM | POA: Diagnosis not present

## 2018-05-11 DIAGNOSIS — C3412 Malignant neoplasm of upper lobe, left bronchus or lung: Secondary | ICD-10-CM

## 2018-05-11 DIAGNOSIS — Z794 Long term (current) use of insulin: Secondary | ICD-10-CM | POA: Diagnosis not present

## 2018-05-11 DIAGNOSIS — Z923 Personal history of irradiation: Secondary | ICD-10-CM | POA: Insufficient documentation

## 2018-05-11 DIAGNOSIS — Z510 Encounter for antineoplastic radiation therapy: Secondary | ICD-10-CM | POA: Diagnosis not present

## 2018-05-11 NOTE — Progress Notes (Signed)
  Radiation Oncology         [phone] ________________________________  Name: [name] MRN: [id number]  Date: 05/11/2018  DOB: 1946/11/04  4DCT COMPLEX SIMULATION / TREATMENT PLANNING NOTE / SPECIAL TREATMENT PROCEDURE  Outpatient    ICD-10-CM   1. Malignant neoplasm of upper lobe of left lung ([place]) C34.12     The patient was positioned on the CT simulator in a complex treatment device custom fitted to their body: A body fix blue bag. The patient's head was in an Accuform.  The patient's arms were over their head. An abdominal compression device was snugly fitted to decrease the patient's intrathoracic movements.   RESPIRATORY MOTION MANAGEMENT SIMULATION  NARRATIVE:  In order to account for effect of respiratory motion on target structures and other organs in the planning and delivery of radiotherapy, this patient underwent respiratory motion management simulation.  To accomplish this, when the patient was brought to the CT simulation planning suite, 4D respiratory motion management CT images were obtained.  The CT images were loaded into the planning software.  Then, using a variety of tools including Cine, MIP, and standard views, the target volume and planning target volumes (PTV) were delineated.  Avoidance structures were contoured.  Treatment planning then occurred.  Dose volume histograms will be generated and reviewed for each of the requested structure.  I contoured the patient's left upper lung  ITV and increased ITV with tight margins for the PTV. I will prescribe 50-54 Gy in 3-5 fractions to be delivered every other day.  Exact dosing depends upon ability to spare organs at risk. I requested a DVH of the patient's lungs, target volumes, esophagus, heart, spinal cord, chest wall, and airways for 3D conformal planning.  Cone beam CT scans will be performed prior to each fraction to allow close PTV margins and sparing of normal tissues from high doses.  SPECIAL TREATMENT  PROCEDURE NOTE:   This constitutes a special treatment procedure due to the ablative dose delivered and the technical nature of treatment.  This highly technical modality of treatment ensures that the ablative dose is centered on the patient's tumor while sparing normal tissues from excessive dose and risk of detrimental effects. -----------------------------------  [name], MD

## 2018-05-11 NOTE — Progress Notes (Signed)
Radiation Oncology         [phone] ________________________________  Name: [name] MRN: [id number]  Date: 05/11/2018  DOB: 1946-09-02  Follow-Up Visit Note  Outpatient  CC: [place], [name], MD  [name], MD  Diagnosis and Prior Radiotherapy:    ICD-10-CM   1. Malignant neoplasm of upper lobe of left lung ([facility]) C34.12    Non small cell lung cancer - adenocarcinoma Cancer Staging Malignant neoplasm of upper lobe of left lung ([place]) Staging form: Lung, AJCC 8th Edition - Clinical: Stage IB (cT2a, cN0, cM0) - Signed by [name], MD on 04/06/2018   CHIEF COMPLAINT: Here for Lung cancer  Narrative:  The patient returns today for a lung mass. Since reconsultation the patient underwent a electromagnetic navigational bronchoscopy with needle aspirations, brushings and transbronchial biospsy on 04/26/2018 with Dr. Roxan Hockey. Our pathologist specifically noted that the lung tumor consistent with adenocarcinoma and different from his prior parotid histology. Lung cancer is consider a new primary, unrelated cancer to previous parotid gland cancer.  As previously noted, the patient is not felt to be a surgical candidate due to his comorbidities.  Patient reported having chronic  7 out of 10 for right upper abdomen pain. Patients family said that he was diagnosed with gastroparesis by Dr. Gala Romney. Last visit with Dr. Gala Romney was at least a year ago where he reported probable gastroparesis but needed further evaluation.   As I am looking at his serial CT scans of the abdomen, he has developed a positioning of the colon anterior to the liver over the years   ALLERGIES:  has No Known Allergies.  Meds: Current Outpatient Medications  Medication Sig Dispense Refill  . allopurinol (ZYLOPRIM) 300 MG tablet TAKE 1 TABLET BY MOUTH EVERY DAY 90 tablet 0  . AMITIZA 24 MCG capsule TAKE 1 CAPSULE(24 MCG) BY MOUTH TWICE DAILY WITH A MEAL 60 capsule 3  . amLODipine (NORVASC) 10 MG  tablet TAKE 1 TABLET BY MOUTH DAILY 90 tablet 0  . cloNIDine (CATAPRES) 0.2 MG tablet TAKE 1 TABLET BY MOUTH THREE TIMES DAILY 270 tablet 0  . furosemide (LASIX) 40 MG tablet Take 1 tablet (40 mg total) by mouth daily. (Patient taking differently: Take 40 mg by mouth 2 (two) times daily. ) 90 tablet 3  . gabapentin (NEURONTIN) 300 MG capsule TAKE 2 CAPSULES(600 MG) BY MOUTH THREE TIMES DAILY (Patient taking differently: TAKE 2 CAPSULES(600 MG) BY MOUTH TWICE DAILY, MAY TAKE AN ADDITIONAL 600 MG MID DAY AS NEEDED FOR PAIN) 540 capsule 0  . Insulin Glargine (LANTUS SOLOSTAR) 100 UNIT/ML Solostar Pen Inject 60 Units into the skin at bedtime. 15 mL 2  . levothyroxine (SYNTHROID, LEVOTHROID) 88 MCG tablet TAKE 1 TABLET(88 MCG) BY MOUTH DAILY 30 tablet 0  . lisinopril (PRINIVIL,ZESTRIL) 20 MG tablet TAKE 1 TABLET BY MOUTH DAILY 90 tablet 0  . metFORMIN (GLUCOPHAGE) 500 MG tablet TAKE 1 TABLET BY MOUTH TWICE DAILY 180 tablet 0  . metoprolol succinate (TOPROL-XL) 50 MG 24 hr tablet TAKE 1 TABLET BY MOUTH DAILY WITH OR IMMEDIATELY FOLLOWING A MEAL 90 tablet 0  . ONETOUCH DELICA LANCETS FINE MISC USE AS DIRECTED FOUR TIMES DAILY 200 each 2  . ONETOUCH VERIO test strip TEST FOUR TIMES DAILY 150 each 5  . oxyCODONE-acetaminophen (PERCOCET) 7.5-325 MG tablet Take 1 tablet by mouth every 4 (four) hours as needed. (Patient taking differently: Take 1 tablet by mouth 3 (three) times daily as needed for severe pain. ) 180 tablet  0  . pantoprazole (PROTONIX) 40 MG tablet TAKE 1 TABLET BY MOUTH DAILY 90 tablet 0  . potassium chloride SA (K-DUR,KLOR-CON) 20 MEQ tablet TAKE 1 TABLET(20 MEQ) BY MOUTH DAILY 30 tablet 0  . simvastatin (ZOCOR) 20 MG tablet TAKE 1 TABLET BY MOUTH EVERY NIGHT AT BEDTIME 90 tablet 0  . tamsulosin (FLOMAX) 0.4 MG CAPS capsule TAKE 1 CAPSULE BY MOUTH DAILY 90 capsule 0  . tiZANidine (ZANAFLEX) 2 MG tablet TAKE 1 TABLET(2 MG) BY MOUTH TWICE DAILY AS NEEDED (Patient taking differently: TAKE 1  TABLET(2 MG) BY MOUTH TWICE DAILY AS NEEDED MUSCLE SPASMS) 180 tablet 2  . Triamcinolone Acetonide (TRIAMCINOLONE 0.1 % CREAM : EUCERIN) CREA Apply 1 application topically 2 (two) times daily as needed. (Patient taking differently: Apply 1 application topically 2 (two) times daily as needed for rash. ) 1 each 2   No current facility-administered medications for this encounter.     Physical Findings: The patient is in no acute distress. Patient is alert and oriented. In a WC  height is 6' (1.829 m). His temperature is 98.5 F (36.9 C). His blood pressure is 97/69 and his pulse is 80. His respiration is 20 and oxygen saturation is 95%. .   Lungs are clear and has a soft systolic murmur at the aortic valve, regular in rate and rhythm.   Lab Findings: Lab Results  Component Value Date   WBC 8.2 04/13/2018   HGB 12.7 (L) 04/13/2018   HCT 40.4 04/13/2018   MCV 75.5 (L) 04/13/2018   PLT 194 04/13/2018    Radiographic Findings: Dg Chest 2 View  Result Date: 04/26/2018 CLINICAL DATA:  Left upper lobe mass EXAM: CHEST - 2 VIEW COMPARISON:  04/18/2018 FINDINGS: Left upper lobe mass lesion approximately 3 cm is unchanged. Left apical blebs noted. Bibasilar interstitial prominent lung markings have developed since the prior study and most likely due to atelectasis. Decreased lung volume bilaterally. No effusion. Surgical staples right apex. IMPRESSION: Left upper lobe mass lesion unchanged.  Left apical blebs. Post surgical staples right apex Hypoventilation with bibasilar atelectasis. Electronically Signed   By: Franchot Gallo M.D.   On: 04/26/2018 10:10   Dg Chest 2 View  Result Date: 04/18/2018 CLINICAL DATA:  Preop lung cancer. EXAM: CHEST - 2 VIEW COMPARISON:  None. FINDINGS: The heart is enlarged. Calcified tortuous aorta. Degenerative change thoracic spine. LEFT upper lobe mass, representing lung cancer. Subsegmental atelectasis both bases. IMPRESSION: LEFT upper lobe lung cancer. Bibasilar  subsegmental atelectasis, but no infiltrates or edema. Electronically Signed   By: Staci Righter M.D.   On: 04/18/2018 13:29   Dg Chest Port 1 View  Result Date: 04/26/2018 CLINICAL DATA:  Status post bronchoscopy with biopsy. EXAM: PORTABLE CHEST 1 VIEW COMPARISON:  Radiographs of same day. FINDINGS: Stable cardiomediastinal silhouette. Interval placement of left internal jugular catheter with distal tip in expected position of left innominate vein. Stable left upper lobe mass is noted. Stable emphysematous disease is noted in both upper lobes. Postsurgical changes are noted in the right upper lobe. No pneumothorax or pleural effusion is noted. Old right rib fractures are noted. Severe degenerative changes seen involving the left glenohumeral joint. IMPRESSION: Stable left upper lobe mass is noted.  No pneumothorax is noted. Emphysema (ICD10-J43.9). Electronically Signed   By: Marijo Conception, M.D.   On: 04/26/2018 11:05   Dg C-arm Bronchoscopy  Result Date: 04/26/2018 C-ARM BRONCHOSCOPY: Fluoroscopy was utilized by the requesting physician.  No radiographic interpretation.  Impression/Plan:  [name] is a 72 y.o. gentleman with non small cell LUL carcinoma. Willl simulate SRBT to his tumor, I anticipate that this will be treated in 3 to 5 fractions with curative intent. A consent form was previously signed at last appointment, however we reviewed logistics benefits risks and side effects of treatment of SRBT today.   I will send a note to Dr. Gala Romney and his PCP re: my observation that over his serial CT scans of the abdomen from the past years, he has developed a positioning of the colon anterior to the liver.  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     Advised patient to continue care with his PCP and GI.   I spent over 20 minutes face to face with the patient and more than 50% of that time was spent in counseling and/or coordination of care.      _____________________________________   [name], MD  This document serves as a record of services personally performed by [name] MD. It was created on her behalf by Delton Coombes, a trained medical scribe. The creation of this record is based on the scribe's personal observations and the provider's statements to them.

## 2018-05-12 DIAGNOSIS — C3412 Malignant neoplasm of upper lobe, left bronchus or lung: Secondary | ICD-10-CM | POA: Insufficient documentation

## 2018-05-12 DIAGNOSIS — Z510 Encounter for antineoplastic radiation therapy: Secondary | ICD-10-CM | POA: Insufficient documentation

## 2018-05-15 ENCOUNTER — Telehealth: Payer: Self-pay | Admitting: *Deleted

## 2018-05-15 NOTE — Telephone Encounter (Signed)
CALLED PATIENT TO INFORM OF PFT APPT. FOR 05-21-18- ARRIVAL TIME - 10:45 AM @ [facility] ADMITTING, LET THE ATTENDANT KNOW THAT YOUR HERE FOR CARDIOPULMONARY, NO INHALERS, NO SMOKING, NO CAFFEFINE - 4 HRS. PRIOR TO THIS TEST, LVM FOR A RETURN CALL

## 2018-05-16 DIAGNOSIS — C3412 Malignant neoplasm of upper lobe, left bronchus or lung: Secondary | ICD-10-CM | POA: Diagnosis not present

## 2018-05-16 DIAGNOSIS — Z510 Encounter for antineoplastic radiation therapy: Secondary | ICD-10-CM | POA: Diagnosis not present

## 2018-05-17 ENCOUNTER — Telehealth: Payer: Self-pay | Admitting: *Deleted

## 2018-05-17 NOTE — Telephone Encounter (Signed)
Received forms from [employer] in regards to disability.    Forms routed to provider.

## 2018-05-18 NOTE — Telephone Encounter (Signed)
Call placed to patient and patient spouse made aware. Will pick up forms

## 2018-05-18 NOTE — Telephone Encounter (Signed)
These forms are for the patient to complete

## 2018-05-21 ENCOUNTER — Ambulatory Visit (HOSPITAL_COMMUNITY)
Admission: RE | Admit: 2018-05-21 | Discharge: 2018-05-21 | Disposition: A | Discharge status: Home / Self Care | Payer: Medicare Other | Source: Ambulatory Visit | Attending: Radiation Oncology | Admitting: Radiation Oncology

## 2018-05-21 ENCOUNTER — Ambulatory Visit
Admission: RE | Admit: 2018-05-21 | Discharge: 2018-05-21 | Disposition: A | Discharge status: Home / Self Care | Payer: Medicare Other | Source: Ambulatory Visit | Attending: Radiation Oncology | Admitting: Radiation Oncology

## 2018-05-21 DIAGNOSIS — C3412 Malignant neoplasm of upper lobe, left bronchus or lung: Secondary | ICD-10-CM | POA: Insufficient documentation

## 2018-05-21 DIAGNOSIS — Z510 Encounter for antineoplastic radiation therapy: Secondary | ICD-10-CM | POA: Diagnosis not present

## 2018-05-21 LAB — PULMONARY FUNCTION TEST
DL/VA % pred: 97 %
DL/VA: 4.61 ml/min/mmHg/L
DLCO UNC % PRED: 57 %
DLCO UNC: 20.05 ml/min/mmHg
FEF 25-75 Post: 0.47 L/sec
FEF 25-75 Pre: 1.67 L/sec
FEF2575-%CHANGE-POST: -71 %
FEF2575-%PRED-POST: 18 %
FEF2575-%Pred-Pre: 63 %
FEV1-%CHANGE-POST: -18 %
FEV1-%Pred-Post: 53 %
FEV1-%Pred-Pre: 65 %
FEV1-POST: 1.66 L
FEV1-Pre: 2.03 L
FEV1FVC-%CHANGE-POST: 2 %
FEV1FVC-%Pred-Pre: 100 %
FEV6-%Change-Post: -20 %
FEV6-%PRED-POST: 53 %
FEV6-%Pred-Pre: 67 %
FEV6-PRE: 2.64 L
FEV6-Post: 2.1 L
FEV6FVC-%Change-Post: 0 %
FEV6FVC-%PRED-PRE: 103 %
FEV6FVC-%Pred-Post: 104 %
FVC-%Change-Post: -20 %
FVC-%Pred-Post: 51 %
FVC-%Pred-Pre: 64 %
FVC-POST: 2.11 L
FVC-Pre: 2.65 L
POST FEV1/FVC RATIO: 79 %
PRE FEV6/FVC RATIO: 100 %
Post FEV6/FVC ratio: 100 %
Pre FEV1/FVC ratio: 77 %

## 2018-05-21 MED ORDER — ALBUTEROL SULFATE (2.5 MG/3ML) 0.083% IN NEBU
2.5000 mg | INHALATION_SOLUTION | Freq: Once | RESPIRATORY_TRACT | Status: AC
  Administered 2018-05-21: 2.5 mg via RESPIRATORY_TRACT

## 2018-05-23 ENCOUNTER — Ambulatory Visit
Admission: RE | Admit: 2018-05-23 | Discharge: 2018-05-23 | Disposition: A | Discharge status: Home / Self Care | Payer: Medicare Other | Source: Ambulatory Visit | Attending: Radiation Oncology | Admitting: Radiation Oncology

## 2018-05-23 DIAGNOSIS — C3412 Malignant neoplasm of upper lobe, left bronchus or lung: Secondary | ICD-10-CM | POA: Diagnosis not present

## 2018-05-23 DIAGNOSIS — Z510 Encounter for antineoplastic radiation therapy: Secondary | ICD-10-CM | POA: Diagnosis not present

## 2018-05-24 ENCOUNTER — Ambulatory Visit: Payer: Medicare Other | Admitting: Radiation Oncology

## 2018-05-24 ENCOUNTER — Other Ambulatory Visit: Payer: Self-pay | Admitting: Family Medicine

## 2018-05-25 ENCOUNTER — Ambulatory Visit
Admission: RE | Admit: 2018-05-25 | Discharge: 2018-05-25 | Disposition: A | Discharge status: Home / Self Care | Payer: Medicare Other | Source: Ambulatory Visit | Attending: Radiation Oncology | Admitting: Radiation Oncology

## 2018-05-25 ENCOUNTER — Telehealth: Payer: Self-pay | Admitting: *Deleted

## 2018-05-25 DIAGNOSIS — C3412 Malignant neoplasm of upper lobe, left bronchus or lung: Secondary | ICD-10-CM | POA: Diagnosis not present

## 2018-05-25 DIAGNOSIS — Z510 Encounter for antineoplastic radiation therapy: Secondary | ICD-10-CM | POA: Diagnosis not present

## 2018-05-25 MED ORDER — DOXYCYCLINE HYCLATE 100 MG PO CAPS: 100.0000 mg | ORAL_CAPSULE | Freq: Two times a day (BID) | ORAL | 0 refills | Status: DC

## 2018-05-25 NOTE — Telephone Encounter (Signed)
Prescription sent to pharmacy.   Call placed to patient and patient wife [name] made aware.

## 2018-05-25 NOTE — Telephone Encounter (Signed)
Received call from patient wife, [name].   Requested refill on ABTx for abscessed areas. States that boil like areas on back are not improving.   MD please advise.

## 2018-05-25 NOTE — Telephone Encounter (Signed)
Okay to send doxycycline 163m BID for 7 days If they are not improving they need to be seen, they can have some one at cancer center look at this back, if not schedule OV

## 2018-05-28 ENCOUNTER — Ambulatory Visit
Admission: RE | Admit: 2018-05-28 | Discharge: 2018-05-28 | Disposition: A | Discharge status: Home / Self Care | Payer: Medicare Other | Source: Ambulatory Visit | Attending: Radiation Oncology | Admitting: Radiation Oncology

## 2018-05-28 DIAGNOSIS — C3412 Malignant neoplasm of upper lobe, left bronchus or lung: Secondary | ICD-10-CM | POA: Diagnosis not present

## 2018-05-28 DIAGNOSIS — Z510 Encounter for antineoplastic radiation therapy: Secondary | ICD-10-CM | POA: Diagnosis not present

## 2018-05-29 LAB — FUNGUS CULTURE WITH STAIN

## 2018-05-29 LAB — FUNGAL ORGANISM REFLEX

## 2018-05-29 LAB — FUNGUS CULTURE RESULT

## 2018-05-30 ENCOUNTER — Ambulatory Visit
Admission: RE | Admit: 2018-05-30 | Discharge: 2018-05-30 | Disposition: A | Discharge status: Home / Self Care | Payer: Medicare Other | Source: Ambulatory Visit | Attending: Radiation Oncology | Admitting: Radiation Oncology

## 2018-05-30 DIAGNOSIS — Z510 Encounter for antineoplastic radiation therapy: Secondary | ICD-10-CM | POA: Diagnosis not present

## 2018-05-30 DIAGNOSIS — C3412 Malignant neoplasm of upper lobe, left bronchus or lung: Secondary | ICD-10-CM | POA: Diagnosis not present

## 2018-06-05 ENCOUNTER — Other Ambulatory Visit: Payer: Self-pay | Admitting: Family Medicine

## 2018-06-05 ENCOUNTER — Encounter: Payer: Self-pay | Admitting: Radiation Oncology

## 2018-06-05 NOTE — Progress Notes (Signed)
  Radiation Oncology         [phone] ________________________________  Name: [name] MRN: [id number]  Date: 06/05/2018  DOB: 10-25-1946  End of Treatment Note  Diagnosis:   Malignant neoplasm of upper lobe of left lung ([place]) Staging form: Lung, AJCC 8th Edition - Clinical: Stage IB (cT2a, cN0, cM0)     Indication for treatment:  Curative       Radiation treatment dates:   05/21/2018 - 05/30/2018  Site/dose:   Lung_Lt Upper/ 50 Gy delivered in 5 fractions of 10 Gy  Beams/energy:   SBRT/ 6X-FFF  Narrative: The patient tolerated radiation treatment relatively well.   He did not experience any symptoms throughout treatment.   Plan: The patient has completed radiation treatment. The patient will return to radiation oncology clinic for routine followup in one month. I advised them to call or return sooner if they have any questions or concerns related to their recovery or treatment.  -----------------------------------  [name], MD

## 2018-06-08 ENCOUNTER — Other Ambulatory Visit: Payer: Self-pay | Admitting: *Deleted

## 2018-06-08 ENCOUNTER — Other Ambulatory Visit: Payer: Self-pay | Admitting: Family Medicine

## 2018-06-08 LAB — ACID FAST CULTURE WITH REFLEXED SENSITIVITIES (MYCOBACTERIA): Acid Fast Culture: NEGATIVE

## 2018-06-08 MED ORDER — OXYCODONE-ACETAMINOPHEN 7.5-325 MG PO TABS: 1.0000 | ORAL_TABLET | ORAL | 0 refills | Status: DC | PRN

## 2018-06-08 NOTE — Telephone Encounter (Signed)
Received call from patient wife, [name].   Requested refill on Oxycodone/APAP.   Ok to refill??  Last office visit  Last refill

## 2018-06-12 ENCOUNTER — Other Ambulatory Visit: Payer: Self-pay | Admitting: Family Medicine

## 2018-06-25 ENCOUNTER — Other Ambulatory Visit: Payer: Self-pay | Admitting: Family Medicine

## 2018-06-26 ENCOUNTER — Ambulatory Visit (INDEPENDENT_AMBULATORY_CARE_PROVIDER_SITE_OTHER): Payer: Medicare Other | Admitting: Family Medicine

## 2018-06-26 ENCOUNTER — Other Ambulatory Visit: Payer: Self-pay

## 2018-06-26 ENCOUNTER — Encounter: Payer: Self-pay | Admitting: Family Medicine

## 2018-06-26 VITALS — BP 122/68 | HR 82 | Temp 98.1°F | Resp 20

## 2018-06-26 DIAGNOSIS — C3412 Malignant neoplasm of upper lobe, left bronchus or lung: Secondary | ICD-10-CM

## 2018-06-26 DIAGNOSIS — R600 Localized edema: Secondary | ICD-10-CM | POA: Diagnosis not present

## 2018-06-26 DIAGNOSIS — N62 Hypertrophy of breast: Secondary | ICD-10-CM | POA: Diagnosis not present

## 2018-06-26 DIAGNOSIS — L0103 Bullous impetigo: Secondary | ICD-10-CM

## 2018-06-26 MED ORDER — SULFAMETHOXAZOLE-TRIMETHOPRIM 800-160 MG PO TABS: 1.0000 | ORAL_TABLET | Freq: Two times a day (BID) | ORAL | 0 refills | Status: DC

## 2018-06-26 NOTE — Patient Instructions (Addendum)
We will call with lab results  Take the  Bactrim antibiotic  Elevate legs  Take Lasix twice a day for next 3 days  F/U 4 months

## 2018-06-26 NOTE — Progress Notes (Signed)
Subjective:    Patient ID: [name], male    DOB: 1946-06-20, 72 y.o.   MRN: [id number]  Patient presents for R LE Wound (pt wife states that area opens and drains when he has a lot of fluid on LE)  Patient here with his wife.  Over the past week he had a large blister come up on his right lower leg he has had bullous impetigo multiple times before.  This will look however spreading.  Wife cleaned it and used wound cleaning solution as well as topical antibiotics.  He still draining in certain areas for the most part has stopped.  He does complain of pain at that site.  He now has a few blisters below the original wound.  He has recently completed his radiation for his lung cancer he is not a candidate for surgery or chemotherapy.  He has follow-up with radiation oncologist on Friday.  His wife is noted that his breast tissue has enlarged the past couple months.  He states that he has soreness across his chest but does not have a specific area he has chronic abdominal pain which she also often complains about.  He has not had any difficulty breathing.  Medications reviewed he still following up with endocrinology for his diabetes.  He is due for labs.  Wife concerned he is growing breast  Review Of Systems:  GEN- denies fatigue, fever, weight loss,weakness, recent illness HEENT- denies eye drainage, change in vision, nasal discharge, CVS- denies chest pain, palpitations RESP- denies SOB, cough, wheeze ABD- denies N/V, change in stools, + chronic abd pain GU- denies dysuria, hematuria, dribbling, incontinence MSK- denies joint pain, muscle aches, injury Neuro- denies headache, dizziness, syncope, seizure activity       Objective:    BP 122/68   Pulse 82   Temp 98.1 F (36.7 C) (Oral)   Resp 20   SpO2 98%  GEN- NAD, alert and oriented x3, sitting in wheelchair, right hemiplegia HEENT- PERRL, EOMI, non injected sclera, pink conjunctiva, MMM, oropharynx clear Neck- Supple,  no thryomegaly, no LAD, mild TTP over scar in crease of neck right side CVS- RRR, no murmur Chest wall- mild gynocemastia, no masses palpated  RESP-CTAB EXT- chronic non pitting edema bilat  R>l improved swelling  Skin- REight leg 6x7cm round wound, scabbing on medial aspect with mild yellow drainage, no odor, no surrounding erythema, below 2 smaller blistesr TTP  Pulses- Radial 2+ diminished      Assessment & Plan:      Problem List Items Addressed This Visit      Unprioritized   Gynecomastia    This may be multifactorial he could deftly be gaining weight as he is immobile.  I do not think that this is related to the radiation but will defer to his radiation oncologist on Friday.  I do not feel any not in the chest area just fatty tissue but discussed with his wife that he went to have a mammogram if there was some concern.      Impetigo bullosa - Primary    Based on history we will treat as previous infection.  He still has an area of the wound that has some mild drainage.  There is no sign of cellulitis surrounding but he does have 2 smaller blisters that have already come up.  We will start him on Bactrim we will do wound care which is wife is used to. Bandage applied in office  Relevant Medications   sulfamethoxazole-trimethoprim (BACTRIM DS,SEPTRA DS) 800-160 MG tablet   Other Relevant Orders   CBC with Differential/Platelet (Completed)   Basic metabolic panel (Completed)   Leg edema    Increase lasix to BID dosing for 3 days due to renal function We discussed elevating his feet as he sits in a wheelchair all day long but he is not very compliant with this.  They have difficulty getting compression hose on. With ongoing swelling this is making it more difficult for the wound and blisters to heal.      Malignant neoplasm of upper lobe of left lung ([facility])   Relevant Medications   sulfamethoxazole-trimethoprim (BACTRIM DS,SEPTRA DS) 800-160 MG tablet      Note: This  dictation was prepared with Dragon dictation along with smaller phrase technology. Any transcriptional errors that result from this process are unintentional.

## 2018-06-27 ENCOUNTER — Encounter: Payer: Self-pay | Admitting: Radiation Oncology

## 2018-06-27 ENCOUNTER — Encounter: Payer: Self-pay | Admitting: Family Medicine

## 2018-06-27 DIAGNOSIS — N62 Hypertrophy of breast: Secondary | ICD-10-CM | POA: Insufficient documentation

## 2018-06-27 LAB — BASIC METABOLIC PANEL
BUN/Creatinine Ratio: 15 (calc) (ref 6–22)
BUN: 22 mg/dL (ref 7–25)
CALCIUM: 10.6 mg/dL — AB (ref 8.6–10.3)
CHLORIDE: 106 mmol/L (ref 98–110)
CO2: 26 mmol/L (ref 20–32)
Creat: 1.48 mg/dL — ABNORMAL HIGH (ref 0.70–1.18)
Glucose, Bld: 124 mg/dL — ABNORMAL HIGH (ref 65–99)
POTASSIUM: 5.2 mmol/L (ref 3.5–5.3)
Sodium: 140 mmol/L (ref 135–146)

## 2018-06-27 LAB — CBC WITH DIFFERENTIAL/PLATELET
Basophils Absolute: 65 cells/uL (ref 0–200)
Basophils Relative: 0.6 %
EOS PCT: 3.7 %
Eosinophils Absolute: 400 cells/uL (ref 15–500)
HCT: 36.9 % — ABNORMAL LOW (ref 38.5–50.0)
Hemoglobin: 11.7 g/dL — ABNORMAL LOW (ref 13.2–17.1)
Lymphs Abs: 745 cells/uL — ABNORMAL LOW (ref 850–3900)
MCH: 23.4 pg — ABNORMAL LOW (ref 27.0–33.0)
MCHC: 31.7 g/dL — ABNORMAL LOW (ref 32.0–36.0)
MCV: 73.9 fL — ABNORMAL LOW (ref 80.0–100.0)
MPV: 11.7 fL (ref 7.5–12.5)
Monocytes Relative: 7.1 %
NEUTROS PCT: 81.7 %
Neutro Abs: 8824 cells/uL — ABNORMAL HIGH (ref 1500–7800)
Platelets: 267 10*3/uL (ref 140–400)
RBC: 4.99 10*6/uL (ref 4.20–5.80)
RDW: 17 % — AB (ref 11.0–15.0)
TOTAL LYMPHOCYTE: 6.9 %
WBC mixed population: 767 cells/uL (ref 200–950)
WBC: 10.8 10*3/uL (ref 3.8–10.8)

## 2018-06-27 NOTE — Progress Notes (Signed)
[name] presents for follow up of radiation completed 05/30/18 to his Left Lung in 3 fractions. He reports occasional pain to his anterior chest/ upper abdomen. He denies shortness of breath. He denies a cough. He reports that he is eating well. He does take percocet in the morning and night for arthritis pain. He is currently taking an antibiotics for boils on his right leg. He fell today while getting out of bed. Paramedics were called for evaluation and to help get him up off the floor. He denies any injuries related to this.   BP 93/63   Pulse 81   Temp 98.4 F (36.9 C)   Resp 20   Ht 6' (1.829 m)   SpO2 99% Comment: room air  BMI 36.21 kg/m    Wt Readings from Last 3 Encounters:  04/18/18 267 lb (121.1 kg)  04/13/18 267 lb (121.1 kg)  04/09/18 267 lb (121.1 kg)

## 2018-06-27 NOTE — Assessment & Plan Note (Signed)
This may be multifactorial he could deftly be gaining weight as he is immobile.  I do not think that this is related to the radiation but will defer to his radiation oncologist on Friday.  I do not feel any not in the chest area just fatty tissue but discussed with his wife that he went to have a mammogram if there was some concern.

## 2018-06-27 NOTE — Assessment & Plan Note (Addendum)
Based on history we will treat as previous infection.  He still has an area of the wound that has some mild drainage.  There is no sign of cellulitis surrounding but he does have 2 smaller blisters that have already come up.  We will start him on Bactrim we will do wound care which is wife is used to. Bandage applied in office

## 2018-06-27 NOTE — Assessment & Plan Note (Signed)
Increase lasix to BID dosing for 3 days due to renal function We discussed elevating his feet as he sits in a wheelchair all day long but he is not very compliant with this.  They have difficulty getting compression hose on. With ongoing swelling this is making it more difficult for the wound and blisters to heal.

## 2018-06-29 ENCOUNTER — Encounter: Payer: Self-pay | Admitting: Radiation Oncology

## 2018-06-29 ENCOUNTER — Ambulatory Visit
Admission: RE | Admit: 2018-06-29 | Discharge: 2018-06-29 | Disposition: A | Discharge status: Home / Self Care | Payer: Medicare Other | Source: Ambulatory Visit | Attending: Radiation Oncology | Admitting: Radiation Oncology

## 2018-06-29 ENCOUNTER — Other Ambulatory Visit: Payer: Self-pay

## 2018-06-29 VITALS — BP 93/63 | HR 81 | Temp 98.4°F | Resp 20 | Ht 72.0 in

## 2018-06-29 DIAGNOSIS — Z08 Encounter for follow-up examination after completed treatment for malignant neoplasm: Secondary | ICD-10-CM | POA: Insufficient documentation

## 2018-06-29 DIAGNOSIS — C3412 Malignant neoplasm of upper lobe, left bronchus or lung: Secondary | ICD-10-CM | POA: Diagnosis not present

## 2018-06-29 DIAGNOSIS — N62 Hypertrophy of breast: Secondary | ICD-10-CM | POA: Diagnosis not present

## 2018-06-29 DIAGNOSIS — G8929 Other chronic pain: Secondary | ICD-10-CM | POA: Insufficient documentation

## 2018-06-29 DIAGNOSIS — Z923 Personal history of irradiation: Secondary | ICD-10-CM | POA: Diagnosis not present

## 2018-06-29 DIAGNOSIS — Z794 Long term (current) use of insulin: Secondary | ICD-10-CM | POA: Diagnosis not present

## 2018-06-29 DIAGNOSIS — E278 Other specified disorders of adrenal gland: Secondary | ICD-10-CM

## 2018-06-29 DIAGNOSIS — Z79890 Hormone replacement therapy (postmenopausal): Secondary | ICD-10-CM | POA: Diagnosis not present

## 2018-06-29 DIAGNOSIS — Z79899 Other long term (current) drug therapy: Secondary | ICD-10-CM | POA: Diagnosis not present

## 2018-06-29 HISTORY — DX: Personal history of irradiation: Z92.3

## 2018-06-29 NOTE — Progress Notes (Addendum)
Radiation Oncology         [phone] ________________________________  Name: [name] MRN: [id number]  Date: 06/29/2018  DOB: 07-13-1946  Follow-Up Visit Note  Outpatient  CC: [place], [name], MD  [name], MD  Diagnosis and Prior Radiotherapy:    ICD-10-CM   1. Malignant neoplasm of upper lobe of left lung ([place]) C34.12      Non small cell lung cancer - adenocarcinoma Cancer Staging Malignant neoplasm of upper lobe of left lung ([place]) Staging form: Lung, AJCC 8th Edition - Clinical: Stage IB (cT2a, cN0, cM0) - Signed by [name], MD on 04/06/2018  05/21/2018 - 05/30/2018: Lung_Lt/ 54 Gy delivered in 3 fractions of 18 Gy  CHIEF COMPLAINT: Here for follow-up and surveillance of lung cancer  Narrative:  The patient returns today for routine follow-up of radiation completed 05/30/2018 to his left lung in 3 fractions. He is accompanied by his wife and presents in a wheelchair. He reports occasional chronic pain to his anterior chest/upper abdomen. This pain is chronic. He denies shortness of breath. He denies cough. He reports that he is eating well. He does take percocet in the morning and night for arthritis pain. He is currently taking an antibiotic for boils on his right leg. He fell today while getting out of bed. Paramedics were called for evaluation and to help get him off the floor. He denies any injuries related to this. His wife states his breasts appear to be getting bigger.   ALLERGIES:  has No Known Allergies.  Meds: Current Outpatient Medications  Medication Sig Dispense Refill  . allopurinol (ZYLOPRIM) 300 MG tablet TAKE 1 TABLET BY MOUTH EVERY DAY 90 tablet 0  . AMITIZA 24 MCG capsule TAKE 1 CAPSULE(24 MCG) BY MOUTH TWICE DAILY WITH A MEAL 60 capsule 3  . amLODipine (NORVASC) 10 MG tablet TAKE 1 TABLET BY MOUTH DAILY 90 tablet 0  . cloNIDine (CATAPRES) 0.2 MG tablet TAKE 1 TABLET BY MOUTH THREE TIMES DAILY 270 tablet 0  . furosemide (LASIX) 40 MG  tablet TAKE 1 TABLET BY MOUTH DAILY 90 tablet 0  . gabapentin (NEURONTIN) 300 MG capsule TAKE 2 CAPSULES(600 MG) BY MOUTH THREE TIMES DAILY (Patient taking differently: TAKE 2 CAPSULES(600 MG) BY MOUTH TWICE DAILY, MAY TAKE AN ADDITIONAL 600 MG MID DAY AS NEEDED FOR PAIN) 540 capsule 0  . Insulin Glargine (LANTUS SOLOSTAR) 100 UNIT/ML Solostar Pen Inject 60 Units into the skin at bedtime. 15 mL 2  . levothyroxine (SYNTHROID, LEVOTHROID) 88 MCG tablet TAKE 1 TABLET(88 MCG) BY MOUTH DAILY 30 tablet 0  . lisinopril (PRINIVIL,ZESTRIL) 20 MG tablet TAKE 1 TABLET BY MOUTH DAILY 90 tablet 0  . metFORMIN (GLUCOPHAGE) 500 MG tablet TAKE 1 TABLET BY MOUTH TWICE DAILY 180 tablet 0  . metoprolol succinate (TOPROL-XL) 50 MG 24 hr tablet TAKE 1 TABLET BY MOUTH DAILY WITH OR IMMEDIATELY FOLLOWING A MEAL 90 tablet 0  . ONETOUCH DELICA LANCETS FINE MISC USE AS DIRECTED FOUR TIMES DAILY 200 each 2  . ONETOUCH VERIO test strip TEST FOUR TIMES DAILY 150 each 5  . oxyCODONE-acetaminophen (PERCOCET) 7.5-325 MG tablet Take 1 tablet by mouth every 4 (four) hours as needed. 180 tablet 0  . pantoprazole (PROTONIX) 40 MG tablet TAKE 1 TABLET BY MOUTH DAILY 90 tablet 0  . potassium chloride SA (K-DUR,KLOR-CON) 20 MEQ tablet TAKE 1 TABLET(20 MEQ) BY MOUTH DAILY 30 tablet 0  . simvastatin (ZOCOR) 20 MG tablet TAKE 1 TABLET BY MOUTH EVERY NIGHT  AT BEDTIME 90 tablet 0  . sulfamethoxazole-trimethoprim (BACTRIM DS,SEPTRA DS) 800-160 MG tablet Take 1 tablet by mouth 2 (two) times daily. 14 tablet 0  . tamsulosin (FLOMAX) 0.4 MG CAPS capsule TAKE 1 CAPSULE BY MOUTH DAILY 90 capsule 0  . tiZANidine (ZANAFLEX) 2 MG tablet TAKE 1 TABLET(2 MG) BY MOUTH TWICE DAILY AS NEEDED (Patient taking differently: TAKE 1 TABLET(2 MG) BY MOUTH TWICE DAILY AS NEEDED MUSCLE SPASMS) 180 tablet 2  . Triamcinolone Acetonide (TRIAMCINOLONE 0.1 % CREAM : EUCERIN) CREA Apply 1 application topically 2 (two) times daily as needed. (Patient taking differently:  Apply 1 application topically 2 (two) times daily as needed for rash. ) 1 each 2   No current facility-administered medications for this encounter.     Physical Findings: The patient is in no acute distress. Patient is alert and oriented. Presents in wheelchair. Accompanied by his wife today.  height is 6' (1.829 m). His temperature is 98.4 F (36.9 C). His blood pressure is 93/63 and his pulse is 81. His respiration is 20 and oxygen saturation is 99%. .    Lungs are clear to auscultation bilaterally. Heart has regular rate and rhythm. Systolic murmur at the aortic region.   Lab Findings: Lab Results  Component Value Date   WBC 10.8 06/26/2018   HGB 11.7 (L) 06/26/2018   HCT 36.9 (L) 06/26/2018   MCV 73.9 (L) 06/26/2018   PLT 267 06/26/2018    Radiographic Findings: No results found.  Impression/Plan:  Recovering well from lung SBRT  Repeat PET scan in a couple months and follow-up in our clinic to discuss the results.  Re: Mr Vavrek's gynecomastia, I asked Dr Buelah Manis to order labs to test his hormone levels. Not a side effort of radiotherapy.  He did however have a borderline adrenal nodule noted on PET months ago.  I will order another PET to restage this at next followup in 2 mo. If he has a secreting adrenal tumor, that could theoretically cause gynecomastia.  I wrote in my note to Dr Buelah Manis that I am alternatively happy to refer the patient to endocrinology - just let me know.  _____________________________________   [name], MD   This document serves as a record of services personally performed by [name], MD. It was created on her behalf by Arlyce Harman, a trained medical scribe. The creation of this record is based on the scribe's personal observations and the provider's statements to them. This document has been checked and approved by the attending provider.

## 2018-07-02 ENCOUNTER — Other Ambulatory Visit: Payer: Self-pay | Admitting: Family Medicine

## 2018-07-02 DIAGNOSIS — N62 Hypertrophy of breast: Secondary | ICD-10-CM

## 2018-07-02 NOTE — Progress Notes (Signed)
Pt has concern for gynecomastia Will get hormone labs  Per oncology, no effect from radiation/therapy   Call pt wife,for the breast enlargement, we are going to need some hormone labs, as it was not caused by his cancer treatments per Dr. Isidore Moos.  I have placed future orders

## 2018-07-02 NOTE — Progress Notes (Signed)
Call placed to patient and patient wife made aware.   Will bring patient to office for labs.

## 2018-07-03 ENCOUNTER — Other Ambulatory Visit: Payer: Self-pay | Admitting: Family Medicine

## 2018-07-03 ENCOUNTER — Other Ambulatory Visit: Payer: Medicare Other

## 2018-07-03 DIAGNOSIS — N62 Hypertrophy of breast: Secondary | ICD-10-CM

## 2018-07-04 DIAGNOSIS — N62 Hypertrophy of breast: Secondary | ICD-10-CM | POA: Diagnosis not present

## 2018-07-04 DIAGNOSIS — R6889 Other general symptoms and signs: Secondary | ICD-10-CM | POA: Diagnosis not present

## 2018-07-05 LAB — TSH: TSH: 3.19 mIU/L (ref 0.40–4.50)

## 2018-07-05 LAB — HCG, TOTAL, QUANTITATIVE: hCG, Beta Chain, Quant, S: <2 m[IU]/mL (ref <5)

## 2018-07-05 LAB — TESTOSTERONE: Testosterone: 73 ng/dL — ABNORMAL LOW (ref 250–827)

## 2018-07-05 LAB — ESTRADIOL: Estradiol: 26 pg/mL (ref <=39)

## 2018-07-05 LAB — LUTEINIZING HORMONE: LH: 14.6 m[IU]/mL (ref 1.6–15.2)

## 2018-07-15 ENCOUNTER — Other Ambulatory Visit: Payer: Self-pay | Admitting: "Endocrinology

## 2018-07-19 DIAGNOSIS — N182 Chronic kidney disease, stage 2 (mild): Secondary | ICD-10-CM | POA: Diagnosis not present

## 2018-07-19 DIAGNOSIS — Z794 Long term (current) use of insulin: Secondary | ICD-10-CM | POA: Diagnosis not present

## 2018-07-19 DIAGNOSIS — E1122 Type 2 diabetes mellitus with diabetic chronic kidney disease: Secondary | ICD-10-CM | POA: Diagnosis not present

## 2018-07-20 LAB — COMPLETE METABOLIC PANEL WITH GFR
AG RATIO: 1.1 (calc) (ref 1.0–2.5)
ALKALINE PHOSPHATASE (APISO): 75 U/L (ref 40–115)
ALT: 9 U/L (ref 9–46)
AST: 10 U/L (ref 10–35)
Albumin: 3.6 g/dL (ref 3.6–5.1)
BILIRUBIN TOTAL: 0.2 mg/dL (ref 0.2–1.2)
BUN / CREAT RATIO: 15 (calc) (ref 6–22)
BUN: 20 mg/dL (ref 7–25)
CHLORIDE: 106 mmol/L (ref 98–110)
CO2: 24 mmol/L (ref 20–32)
Calcium: 10.4 mg/dL — ABNORMAL HIGH (ref 8.6–10.3)
Creat: 1.35 mg/dL — ABNORMAL HIGH (ref 0.70–1.18)
GFR, EST AFRICAN AMERICAN: 61 mL/min/{1.73_m2} (ref >=60)
GFR, Est Non African American: 52 mL/min/{1.73_m2} — ABNORMAL LOW (ref >=60)
Globulin: 3.3 g/dL (calc) (ref 1.9–3.7)
Glucose, Bld: 89 mg/dL (ref 65–139)
POTASSIUM: 4.3 mmol/L (ref 3.5–5.3)
Sodium: 139 mmol/L (ref 135–146)
TOTAL PROTEIN: 6.9 g/dL (ref 6.1–8.1)

## 2018-07-20 LAB — HEMOGLOBIN A1C
EAG (MMOL/L): 8.2 (calc)
HEMOGLOBIN A1C: 6.8 %{Hb} — AB (ref <5.7)
Mean Plasma Glucose: 148 (calc)

## 2018-07-21 ENCOUNTER — Other Ambulatory Visit: Payer: Self-pay | Admitting: Family Medicine

## 2018-07-26 ENCOUNTER — Ambulatory Visit (INDEPENDENT_AMBULATORY_CARE_PROVIDER_SITE_OTHER): Payer: Medicare Other | Admitting: "Endocrinology

## 2018-07-26 ENCOUNTER — Encounter: Payer: Self-pay | Admitting: "Endocrinology

## 2018-07-26 VITALS — BP 110/75 | HR 66

## 2018-07-26 DIAGNOSIS — I10 Essential (primary) hypertension: Secondary | ICD-10-CM | POA: Diagnosis not present

## 2018-07-26 DIAGNOSIS — E039 Hypothyroidism, unspecified: Secondary | ICD-10-CM

## 2018-07-26 DIAGNOSIS — N62 Hypertrophy of breast: Secondary | ICD-10-CM

## 2018-07-26 DIAGNOSIS — E1122 Type 2 diabetes mellitus with diabetic chronic kidney disease: Secondary | ICD-10-CM | POA: Diagnosis not present

## 2018-07-26 DIAGNOSIS — Z794 Long term (current) use of insulin: Secondary | ICD-10-CM

## 2018-07-26 DIAGNOSIS — N182 Chronic kidney disease, stage 2 (mild): Secondary | ICD-10-CM

## 2018-07-26 DIAGNOSIS — E782 Mixed hyperlipidemia: Secondary | ICD-10-CM | POA: Diagnosis not present

## 2018-07-26 DIAGNOSIS — E291 Testicular hypofunction: Secondary | ICD-10-CM | POA: Insufficient documentation

## 2018-07-26 NOTE — Patient Instructions (Signed)

## 2018-07-26 NOTE — Progress Notes (Signed)
Endocrinology follow-up note   Subjective:    Patient ID: [name], male    DOB: [birth year],    Past Medical History:  Diagnosis Date  . Arthritis   . Asthma    "hx of"  . Barrett's esophagus    EGD 03/23/2011 & EGD 2/09 bx proven  . BPH (benign prostatic hyperplasia)   . Cancer of parotid gland ([place]) 11/23/12   Adenocarcinoma  . Chronic abdominal pain   . Colon polyp 03/23/2011   tubular adenoma, Dr. Gala Romney  . 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 ([place]) 07/15/2011  . CVA (cerebral infarction) 1998   right sided deficit  . Diverticulosis    TCS 03/23/11 pancolonic diverticula &TCS 5/08, pancolonic diverticula  . DM (diabetes mellitus) ([place])   . Edema of lower extremity 12/21/12   bilateral   . Esotropia of left eye   . GERD (gastroesophageal reflux disease)   . Glaucoma (increased eye pressure)   . Gout   . Gout   . Hemorrhagic colitis 06/06/2012.  [name] Hemorrhoids, internal 03/23/2011   tcs by Dr. Gala Romney  . Hepatitis    esosiniphilic, tx with prednisone  . Hiatal hernia   . History of radiation therapy 05/21/18- 05/30/18   Left Lung/ 54 Gy delivered in 3 fractions of 18 Gy. SBRT  . HTN (hypertension)   . Hx of radiation therapy 1974   right base of skull area-lymphoma  . Hyperlipidemia   . Lower facial weakness    Right  . Lymphoma ([place]) 1974   XRT at [facility], right base of skull area  . Neuropathy   . Renal insufficiency   . Steatohepatitis    liver biopsy 2009  . Stroke [facility]) 1998   right hemiparesis/plegia   Past Surgical History:  Procedure Laterality Date  . BIOPSY  12/01/2016   Procedure: BIOPSY;  Surgeon: Daneil Dolin, MD;  Location: [facility];  Service: Endoscopy;;  duodenum, gastric, esophagus  . CHOLECYSTECTOMY    . COLONOSCOPY  03/23/11   Dr. Gala Romney  pancolonic diverticula, hemorrhoids, tubular adenoma.. next tcs 03/2016  . COLONOSCOPY WITH PROPOFOL N/A 12/01/2016   inadequate bowel prep precluded exam  . ESOPHAGOGASTRODUODENOSCOPY   02/05/08   goblet cell metaplasia/negative for H.pylori  . ESOPHAGOGASTRODUODENOSCOPY  03/23/11   Dr. Gala Romney, barretts, hiatal hernia  . ESOPHAGOGASTRODUODENOSCOPY (EGD) WITH PROPOFOL N/A 12/01/2016   Dr. Gala Romney: Large amount of retained gastric contents precluded completion of the stomach. Mucosal changes were found in the stomach. Erosions and somewhat scalloped appearing mucosa present, reactive gastritis/no H pylori. Barrett's esophagus noted, no dysplasia on biopsy. Duodenal biopsies taken as well, benign, no evidence of eosinophilia.  [name] MASS BIOPSY  11/01/2012   Procedure: NECK MASS BIOPSY;  Surgeon: Ascencion Dike, MD;  Location: [facility] OR[facility];  Service: ENT;  Laterality: Right;  Excisional Bx Right Neck Mass; attempted external jugular cutdown of left side  . PAROTIDECTOMY  11/24/2012   Procedure: PAROTIDECTOMY;  Surgeon: Ascencion Dike, MD;  Location: [place];  Service: ENT;  Laterality: N/A;  Total parotidectomy  . PLEURECTOMY    . right lymph node removal Right    behind right ear  . Right video-assisted thoracic surgery, pleurectomy, and pleurodesis  2011  . VIDEO BRONCHOSCOPY WITH ENDOBRONCHIAL NAVIGATION N/A 04/26/2018   Procedure: VIDEO BRONCHOSCOPY WITH ENDOBRONCHIAL NAVIGATION;  Surgeon: Melrose Nakayama, MD;  Location: [place];  Service: Thoracic;  Laterality: N/A;   Social History   Socioeconomic History  .  Marital status: Married    Spouse name: Not on file  . Number of children: 1  . Years of education: Not on file  . Highest education level: Not on file  Occupational History  . Occupation: disabled  Social Needs  . Financial resource strain: Not on file  . Food insecurity:    Worry: Not on file    Inability: Not on file  . Transportation needs:    Medical: Not on file    Non-medical: Not on file  Tobacco Use  . Smoking status: Former Smoker    Packs/day: 3.00    Years: 25.00    Pack years: 75.00    Types: Cigarettes    Last attempt to quit: 03/01/1997    Years since  quitting: 21.4  . Smokeless tobacco: Never Used  Substance and Sexual Activity  . Alcohol use: No  . Drug use: No  . Sexual activity: Not Currently  Lifestyle  . Physical activity:    Days per week: Not on file    Minutes per session: Not on file  . Stress: Not on file  Relationships  . Social connections:    Talks on phone: Not on file    Gets together: Not on file    Attends religious service: Not on file    Active member of club or organization: Not on file    Attends meetings of clubs or organizations: Not on file    Relationship status: Not on file  Other Topics Concern  . Not on file  Social History Narrative  . Not on file   Outpatient Encounter Medications as of 07/26/2018  Medication Sig  . allopurinol (ZYLOPRIM) 300 MG tablet TAKE 1 TABLET BY MOUTH EVERY DAY  . AMITIZA 24 MCG capsule TAKE 1 CAPSULE(24 MCG) BY MOUTH TWICE DAILY WITH A MEAL  . amLODipine (NORVASC) 10 MG tablet TAKE 1 TABLET BY MOUTH DAILY  . cloNIDine (CATAPRES) 0.2 MG tablet TAKE 1 TABLET BY MOUTH THREE TIMES DAILY  . furosemide (LASIX) 40 MG tablet TAKE 1 TABLET BY MOUTH DAILY  . gabapentin (NEURONTIN) 300 MG capsule TAKE 2 CAPSULES(600 MG) BY MOUTH THREE TIMES DAILY (Patient taking differently: TAKE 2 CAPSULES(600 MG) BY MOUTH TWICE DAILY, MAY TAKE AN ADDITIONAL 600 MG MID DAY AS NEEDED FOR PAIN)  . Insulin Glargine (LANTUS SOLOSTAR) 100 UNIT/ML Solostar Pen Inject 60 Units into the skin at bedtime.  [name] levothyroxine (SYNTHROID, LEVOTHROID) 88 MCG tablet TAKE 1 TABLET(88 MCG) BY MOUTH DAILY  . lisinopril (PRINIVIL,ZESTRIL) 20 MG tablet TAKE 1 TABLET BY MOUTH DAILY  . metFORMIN (GLUCOPHAGE) 500 MG tablet TAKE 1 TABLET BY MOUTH TWICE DAILY  . metoprolol succinate (TOPROL-XL) 50 MG 24 hr tablet TAKE 1 TABLET BY MOUTH DAILY WITH OR IMMEDIATELY FOLLOWING A MEAL  . ONETOUCH DELICA LANCETS FINE MISC USE AS DIRECTED FOUR TIMES DAILY  . ONETOUCH VERIO test strip TEST FOUR TIMES DAILY  . oxyCODONE-acetaminophen  (PERCOCET) 7.5-325 MG tablet Take 1 tablet by mouth every 4 (four) hours as needed.  . pantoprazole (PROTONIX) 40 MG tablet TAKE 1 TABLET BY MOUTH DAILY  . potassium chloride SA (K-DUR,KLOR-CON) 20 MEQ tablet TAKE 1 TABLET(20 MEQ) BY MOUTH DAILY  . simvastatin (ZOCOR) 20 MG tablet TAKE 1 TABLET BY MOUTH EVERY NIGHT AT BEDTIME  . sulfamethoxazole-trimethoprim (BACTRIM DS,SEPTRA DS) 800-160 MG tablet Take 1 tablet by mouth 2 (two) times daily.  . tamsulosin (FLOMAX) 0.4 MG CAPS capsule TAKE 1 CAPSULE BY MOUTH DAILY  . tiZANidine (ZANAFLEX) 2  MG tablet TAKE 1 TABLET(2 MG) BY MOUTH TWICE DAILY AS NEEDED (Patient taking differently: TAKE 1 TABLET(2 MG) BY MOUTH TWICE DAILY AS NEEDED MUSCLE SPASMS)  . Triamcinolone Acetonide (TRIAMCINOLONE 0.1 % CREAM : EUCERIN) CREA Apply 1 application topically 2 (two) times daily as needed. (Patient taking differently: Apply 1 application topically 2 (two) times daily as needed for rash. )   No facility-administered encounter medications on file as of 07/26/2018.    ALLERGIES: No Known Allergies VACCINATION STATUS: Immunization History  Administered Date(s) Administered  . Influenza Split 08/24/2012  . Influenza, High Dose Seasonal PF 10/31/2017  . Influenza,inj,Quad PF,6+ Mos 10/09/2013, 09/05/2014, 11/23/2015, 08/16/2016  . Pneumococcal Conjugate-13 01/07/2014  . Pneumococcal Polysaccharide-23 11/25/2012    Diabetes  He presents for his follow-up diabetic visit. He has type 2 diabetes mellitus. Onset time: diagnosed approx at age 41. His disease course has been improving. There are no hypoglycemic associated symptoms. Pertinent negatives for hypoglycemia include no confusion, headaches, pallor or seizures. Pertinent negatives for diabetes include no chest pain, no fatigue, no polydipsia, no polyphagia, no polyuria and no weakness. Symptoms are improving. Diabetic complications include a CVA. Risk factors for coronary artery disease include diabetes mellitus,  dyslipidemia, hypertension, male sex, obesity, sedentary lifestyle and tobacco exposure. Current diabetic treatment includes insulin injections. He is following a generally unhealthy diet. When asked about meal planning, he reported none. He has had a previous visit with a dietitian. He never participates in exercise. His breakfast blood glucose range is generally 140-180 mg/dl. His bedtime blood glucose range is generally 140-180 mg/dl. His overall blood glucose range is 140-180 mg/dl. An ACE inhibitor/angiotensin II receptor blocker is being taken.  Hyperlipidemia  This is a chronic problem. The current episode started more than 1 year ago. The problem is uncontrolled. Recent lipid tests were reviewed and are variable. Exacerbating diseases include diabetes and obesity. Pertinent negatives include no chest pain, myalgias or shortness of breath. Risk factors for coronary artery disease include diabetes mellitus, dyslipidemia, hypertension, male sex and a sedentary lifestyle.  Hypertension  This is a chronic problem. The current episode started more than 1 year ago. The problem is controlled. Pertinent negatives include no chest pain, headaches, neck pain, palpitations or shortness of breath. Risk factors for coronary artery disease include dyslipidemia, diabetes mellitus, male gender, sedentary lifestyle and obesity. Past treatments include ACE inhibitors. Hypertensive end-organ damage includes CVA.     Review of Systems  Constitutional: Negative for chills, fatigue, fever and unexpected weight change.  HENT: Negative for dental problem, mouth sores and trouble swallowing.   Eyes: Negative for visual disturbance.  Respiratory: Negative for cough, choking, chest tightness, shortness of breath and wheezing.   Cardiovascular: Negative for chest pain, palpitations and leg swelling.  Gastrointestinal: Negative for abdominal distention, abdominal pain, constipation, diarrhea, nausea and vomiting.   Endocrine: Negative for polydipsia, polyphagia and polyuria.  Genitourinary: Negative for dysuria, flank pain, hematuria and urgency.  Musculoskeletal: Negative for back pain, gait problem, joint swelling, myalgias and neck pain.  Skin: Negative for pallor, rash and wound.  Neurological: Negative for seizures, weakness, numbness and headaches.  Psychiatric/Behavioral: Negative for confusion, dysphoric mood, hallucinations and suicidal ideas.    Objective:    BP 110/75   Pulse 66   Wt Readings from Last 3 Encounters:  04/18/18 267 lb (121.1 kg)  04/13/18 267 lb (121.1 kg)  04/09/18 267 lb (121.1 kg)    Physical Exam  Constitutional: He is oriented to person, place, and time. He  appears well-developed. He is cooperative.  HENT:  Head: Normocephalic and atraumatic.  Eyes: EOM are normal.  Neck: Normal range of motion. Neck supple. No tracheal deviation present. No thyromegaly present.  Cardiovascular: Normal rate. Exam reveals no gallop.  No murmur heard. Pulses:      Dorsalis pedis pulses are 1+ on the right side.       Posterior tibial pulses are 1+ on the right side.  Pulmonary/Chest: Effort normal. No respiratory distress. He has no wheezes.  Abdominal: He exhibits no distension.  Musculoskeletal: He exhibits no edema.  Neurological: He is alert and oriented to person, place, and time. A sensory deficit is present. No cranial nerve deficit. He exhibits normal muscle tone. Gait normal.  Pt is wheelchair bound due to past recurrent CVA  Skin: Skin is warm and dry. No rash noted. No cyanosis. Nails show no clubbing.  He has bilateral gynecomastia, palpation did not reveal any mass lesions.  Nipple discharge.  Psychiatric: He has a normal mood and affect. His speech is normal. Judgment normal. Cognition and memory are normal.    CMP  CMP Latest Ref Rng & Units 07/19/2018 06/26/2018 04/13/2018  Glucose 65 - 139 mg/dL 89 124(H) 146(H)  BUN 7 - 25 mg/dL 20 22 18   Creatinine 0.70 -  1.18 mg/dL 1.35(H) 1.48(H) 1.39(H)  Sodium 135 - 146 mmol/L 139 140 138  Potassium 3.5 - 5.3 mmol/L 4.3 5.2 4.7  Chloride 98 - 110 mmol/L 106 106 106  CO2 20 - 32 mmol/L 24 26 23   Calcium 8.6 - 10.3 mg/dL 10.4(H) 10.6(H) 10.5(H)  Total Protein 6.1 - 8.1 g/dL 6.9 - 7.2  Total Bilirubin 0.2 - 1.2 mg/dL 0.2 - 0.2(L)  Alkaline Phos 38 - 126 U/L - - 76  AST 10 - 35 U/L 10 - 15  ALT 9 - 46 U/L 9 - 16(L)    Diabetic Labs (most recent): Lab Results  Component Value Date   HGBA1C 6.8 (H) 07/19/2018   HGBA1C 7.3 (H) 04/12/2018   HGBA1C 6.4 (H) 12/07/2017   Lipid Panel     Component Value Date/Time   CHOL 151 11/01/2017 0828   TRIG 219 (H) 11/01/2017 0828   HDL 56 11/01/2017 0828   CHOLHDL 2.7 11/01/2017 0828   VLDL 66 (H) 05/04/2017 0931   LDLCALC 66 11/01/2017 0828     Assessment & Plan:   1. Type 2 diabetes mellitus with stage 2 chronic kidney disease and CVA -He presents with controlled diabetes with A1c of 6.8%.  His diabetes is complicated by recurrent CVA, stage 2-3 renal insufficiency, and he remains at extremely high risk for more acute and chronic complications of diabetes which include CAD, CVA, CKD, retinopathy, and neuropathy. These are all discussed in detail with the patient.  -In May 2019, he was diagnosed with non-small cell lung cancer , status post multiple rounds of radiation treatment completed on May 30, 2018.   -    No documented or reported hypoglycemia.   Glucose logs and insulin administration records pertaining to this visit,  to be scanned into patient's records.  Recent labs reviewed.   - I have re-counseled the patient on diet management and weight loss  by adopting a carbohydrate restricted / protein rich  Diet.  -  Suggestion is made for him to avoid simple carbohydrates  from his diet including Cakes, Sweet Desserts / Pastries, Ice Cream, Soda (diet and regular), Sweet Tea, Candies, Chips, Cookies, Store Bought Juices, Alcohol in Excess of  1-2  drinks a day, Artificial Sweeteners, and "Sugar-free" Products. This will help patient to have stable blood glucose profile and potentially avoid unintended weight gain.   - Patient is advised to stick to a routine mealtimes to eat 3 meals  a day and avoid unnecessary snacks ( to snack only to correct hypoglycemia).  - I have approached patient with the following individualized plan to manage diabetes and patient agrees.  -Did fairly well with simplified treatment program, with A1c of 6.8% and no hypoglycemia.   -I advised his wife who cares for him, to continue Lantus 60 units nightly associated with monitoring of blood glucose 2 times daily-before breakfast and at bedtime, continue to hold Humalog for now.    -Patient is encouraged to call clinic for blood glucose levels less than 70 or above 300 mg /dl.  - I will continue  Metformin 566m  by mouth twice a day with meals.   -Patient is not a candidate for SGLT2 inhibitors due to CKD, not a suitable candidate for incretin therapy.  - Patient specific target  for A1c; LDL, HDL, Triglycerides, and  Waist Circumference were discussed in detail.  2) BP/HTN: His blood pressure is controlled to target.  He is advised to continue his current medications including lisinopril 20 mg p.o. daily. ,  Amlodipine 10 mg p.o. daily, clonidine 0.2 mg p.o. twice daily.    3) Lipids/HPL: Recent lipid panel showed controlled LDL at 66.  He is advised to continue simvastatin 20 mg p.o. nightly.    4)  Weight/Diet: He is limited on how much he can exercise and carbohydrates information provided.   5) hypothyroidism: -His thyroid function tests are consistent with appropriate replacement. - I advised him to continue   levothyroxine 88 g by mouth every morning.  - We discussed about correct intake of levothyroxine, at fasting, with water, separated by at least 30 minutes from breakfast, and separated by more than 4 hours from calcium, iron, multivitamins, acid  reflux medications (PPIs). -Patient is made aware of the fact that thyroid hormone replacement is needed for life, dose to be adjusted by periodic monitoring of thyroid function tests.  6) hypogonadism/gynecomastia -new diagnosis   -His wife reports that his gynecomastia is that of insidious onset progressing over several years. -Gynecomastia could be of multiple etiologies including unopposed effect of estrogen due to significantly low testosterone of 73. -Patient is not a suitable candidate for testosterone replacement. -I will add prolactin measurement along with his next blood work. -Examination of his bilateral breasts did not reveal mass lesions, just generalized enlargement of his bilateral breast, no nipple discharge. -I instructed patient to continue to fill his breasts on a regular basis, if he notices mass lesion or continues to enlarge, he may need breast ultrasound for better evaluation.  7) Chronic Care/Health Maintenance:  -Patient is on ACEI/ARB and Statin medications and encouraged to continue to follow up with Ophthalmology, Podiatrist at least yearly or according to recommendations, and advised to  stay away from smoking. I have recommended yearly flu vaccine and pneumonia vaccination at least every 5 years; moderate intensity exercise for up to 150 minutes weekly; and  sleep for at least 7 hours a day. Leg elevation when sitting and lying in bed is advised to prevent dependent bilateral lower extremity edema.   8) hypercalcemia  - continues to have mild hypercalcemia, currently at 10.4.  Could be related to his non-small cell lung cancer.  At PTH, PTH related peptide along  with repeat calcium before his next visit.  -He is also at risk of immobilization hypercalcemia due to his prolonged immobilization secondary to his stroke.   - I advised patient to maintain close follow up with Dr. Buelah Manis for primary care needs.   - Time spent with the patient: 25 min, of which >50%  was spent in reviewing his blood glucose logs , discussing his hypo- and hyper-glycemic episodes, reviewing his current and  previous labs and insulin doses and developing a plan to avoid hypo- and hyper-glycemia. Please refer to Patient Instructions for Blood Glucose Monitoring and Insulin/Medications Dosing Guide"  in media tab for additional information. [name] participated in the discussions, expressed understanding, and voiced agreement with the above plans.  All questions were answered to his satisfaction. he is encouraged to contact clinic should he have any questions or concerns prior to his return visit.   Follow up plan: Return in about 3 months (around 10/26/2018) for Follow up with Pre-visit Labs, Meter, and Logs.  Glade Lloyd, MD Phone: [phone]  Fax: [phone]  This note was partially dictated with voice recognition software. Similar sounding words can be transcribed inadequately or may not  be corrected upon review.  07/26/2018, 12:07 PM

## 2018-07-27 ENCOUNTER — Emergency Department (HOSPITAL_COMMUNITY): Payer: Medicare Other

## 2018-07-27 ENCOUNTER — Ambulatory Visit: Payer: Medicare Other | Admitting: Podiatry

## 2018-07-27 ENCOUNTER — Encounter (HOSPITAL_COMMUNITY): Payer: Self-pay

## 2018-07-27 ENCOUNTER — Emergency Department (HOSPITAL_COMMUNITY)
Admission: EM | Admit: 2018-07-27 | Discharge: 2018-07-27 | Disposition: A | Discharge status: Home / Self Care | Payer: Medicare Other | Attending: Emergency Medicine | Admitting: Emergency Medicine

## 2018-07-27 DIAGNOSIS — F17200 Nicotine dependence, unspecified, uncomplicated: Secondary | ICD-10-CM | POA: Insufficient documentation

## 2018-07-27 DIAGNOSIS — N183 Chronic kidney disease, stage 3 (moderate): Secondary | ICD-10-CM | POA: Diagnosis not present

## 2018-07-27 DIAGNOSIS — I129 Hypertensive chronic kidney disease with stage 1 through stage 4 chronic kidney disease, or unspecified chronic kidney disease: Secondary | ICD-10-CM | POA: Insufficient documentation

## 2018-07-27 DIAGNOSIS — I10 Essential (primary) hypertension: Secondary | ICD-10-CM | POA: Diagnosis not present

## 2018-07-27 DIAGNOSIS — Z794 Long term (current) use of insulin: Secondary | ICD-10-CM | POA: Insufficient documentation

## 2018-07-27 DIAGNOSIS — Z79899 Other long term (current) drug therapy: Secondary | ICD-10-CM | POA: Diagnosis not present

## 2018-07-27 DIAGNOSIS — J984 Other disorders of lung: Secondary | ICD-10-CM | POA: Diagnosis not present

## 2018-07-27 DIAGNOSIS — G8929 Other chronic pain: Secondary | ICD-10-CM | POA: Insufficient documentation

## 2018-07-27 DIAGNOSIS — J45909 Unspecified asthma, uncomplicated: Secondary | ICD-10-CM | POA: Insufficient documentation

## 2018-07-27 DIAGNOSIS — R2241 Localized swelling, mass and lump, right lower limb: Secondary | ICD-10-CM | POA: Insufficient documentation

## 2018-07-27 DIAGNOSIS — R1011 Right upper quadrant pain: Secondary | ICD-10-CM

## 2018-07-27 DIAGNOSIS — R109 Unspecified abdominal pain: Secondary | ICD-10-CM

## 2018-07-27 DIAGNOSIS — E1122 Type 2 diabetes mellitus with diabetic chronic kidney disease: Secondary | ICD-10-CM | POA: Diagnosis not present

## 2018-07-27 DIAGNOSIS — E119 Type 2 diabetes mellitus without complications: Secondary | ICD-10-CM | POA: Diagnosis not present

## 2018-07-27 DIAGNOSIS — R600 Localized edema: Secondary | ICD-10-CM | POA: Diagnosis not present

## 2018-07-27 DIAGNOSIS — K449 Diaphragmatic hernia without obstruction or gangrene: Secondary | ICD-10-CM | POA: Diagnosis not present

## 2018-07-27 HISTORY — DX: Functional dyspepsia: K30

## 2018-07-27 HISTORY — DX: Rash and other nonspecific skin eruption: R21

## 2018-07-27 HISTORY — DX: Edema, unspecified: R60.9

## 2018-07-27 HISTORY — DX: Localized edema: R60.0

## 2018-07-27 HISTORY — DX: Other constipation: K59.09

## 2018-07-27 LAB — DIFFERENTIAL
Basophils Absolute: 0.1 10*3/uL (ref 0.0–0.1)
Basophils Relative: 1 %
EOS ABS: 0.6 10*3/uL (ref 0.0–0.7)
Eosinophils Relative: 8 %
LYMPHS ABS: 0.9 10*3/uL (ref 0.7–4.0)
Lymphocytes Relative: 12 %
Monocytes Absolute: 0.4 10*3/uL (ref 0.1–1.0)
Monocytes Relative: 6 %
NEUTROS ABS: 5.1 10*3/uL (ref 1.7–7.7)
Neutrophils Relative %: 73 %

## 2018-07-27 LAB — COMPREHENSIVE METABOLIC PANEL
ALBUMIN: 3.9 g/dL (ref 3.5–5.0)
ALK PHOS: 79 U/L (ref 38–126)
ALT: 14 U/L (ref 0–44)
AST: 15 U/L (ref 15–41)
Anion gap: 10 (ref 5–15)
BILIRUBIN TOTAL: 0.7 mg/dL (ref 0.3–1.2)
BUN: 19 mg/dL (ref 8–23)
CALCIUM: 10.8 mg/dL — AB (ref 8.9–10.3)
CO2: 26 mmol/L (ref 22–32)
CREATININE: 1.3 mg/dL — AB (ref 0.61–1.24)
Chloride: 104 mmol/L (ref 98–111)
GFR calc Af Amer: >60 mL/min (ref >60)
GFR, EST NON AFRICAN AMERICAN: 54 mL/min — AB (ref >60)
Glucose, Bld: 148 mg/dL — ABNORMAL HIGH (ref 70–99)
POTASSIUM: 4.4 mmol/L (ref 3.5–5.1)
Sodium: 140 mmol/L (ref 135–145)
TOTAL PROTEIN: 8 g/dL (ref 6.5–8.1)

## 2018-07-27 LAB — CBC
HCT: 41.7 % (ref 39.0–52.0)
HEMOGLOBIN: 13.2 g/dL (ref 13.0–17.0)
MCH: 24.1 pg — ABNORMAL LOW (ref 26.0–34.0)
MCHC: 31.7 g/dL (ref 30.0–36.0)
MCV: 76.1 fL — AB (ref 78.0–100.0)
Platelets: 244 10*3/uL (ref 150–400)
RBC: 5.48 MIL/uL (ref 4.22–5.81)
RDW: 18.2 % — ABNORMAL HIGH (ref 11.5–15.5)
WBC: 7.6 10*3/uL (ref 4.0–10.5)

## 2018-07-27 LAB — URINALYSIS, ROUTINE W REFLEX MICROSCOPIC
BACTERIA UA: NONE SEEN
BILIRUBIN URINE: NEGATIVE
Glucose, UA: NEGATIVE mg/dL
KETONES UR: NEGATIVE mg/dL
LEUKOCYTES UA: NEGATIVE
Nitrite: NEGATIVE
Protein, ur: >=300 mg/dL — AB
Specific Gravity, Urine: 1.015 (ref 1.005–1.030)
pH: 5 (ref 5.0–8.0)

## 2018-07-27 LAB — TROPONIN I

## 2018-07-27 LAB — LIPASE, BLOOD: Lipase: 26 U/L (ref 11–51)

## 2018-07-27 MED ORDER — FAMOTIDINE 20 MG PO TABS: 20.0000 mg | ORAL_TABLET | Freq: Every day | ORAL | 0 refills | Status: DC

## 2018-07-27 MED ORDER — MORPHINE SULFATE (PF) 2 MG/ML IV SOLN
2.0000 mg | Freq: Once | INTRAVENOUS | Status: AC
  Administered 2018-07-27: 2 mg via INTRAMUSCULAR
  Filled 2018-07-27: qty 1

## 2018-07-27 MED ORDER — ACETAMINOPHEN 325 MG PO TABS
650.0000 mg | ORAL_TABLET | Freq: Once | ORAL | Status: AC
  Administered 2018-07-27: 650 mg via ORAL
  Filled 2018-07-27: qty 2

## 2018-07-27 NOTE — Discharge Instructions (Signed)
Eat a bland diet, avoiding greasy, fatty, fried foods, as well as spicy and acidic foods or beverages.  Avoid eating within 2 to 3 hours before going to bed or laying down.  Also avoid teas, colas, coffee, chocolate, pepermint and spearment. Take the prescription as directed.  Call your regular medical doctor and your GI doctor today to schedule a follow up appointment within the next week.  Return to the Emergency Department immediately if worsening.

## 2018-07-27 NOTE — ED Notes (Signed)
Unable to obtain peripheral IV x 2 sticks

## 2018-07-27 NOTE — ED Notes (Signed)
Bilateral fetal pulses noted with doppler at 74-77bpm

## 2018-07-27 NOTE — ED Provider Notes (Signed)
[facility] EMERGENCY DEPARTMENT Provider Note   CSN: [id number] Arrival date & time: 07/27/18  0802     History   Chief Complaint Chief Complaint  Patient presents with  . Abdominal Pain    HPI [name] is a 72 y.o. male.  HPI  Pt was seen at 0850.  Per pt, c/o gradual onset and persistence of constant acute flair of his chronic RUQ abd "pain" for the past several years. Has been associated with no other symptoms.  Describes the abd pain as "aching."  Denies any change in his usual chronic pain pattern. Denies N/V, no diarrhea, no fevers, no back pain, no rash, no CP/SOB, no black or blood in stools.      Past Medical History:  Diagnosis Date  . Arthritis   . Asthma    "hx of"  . Barrett's esophagus    EGD 03/23/2011 & EGD 2/09 bx proven  . BPH (benign prostatic hyperplasia)   . Cancer of parotid gland ([place]) 11/23/12   Adenocarcinoma  . Chronic abdominal pain   . Chronic constipation   . Colon polyp 03/23/2011   tubular adenoma, Dr. Gala Romney  . 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 ([place]) 07/15/2011  . CVA (cerebral infarction) 1998   right sided deficit  . Delayed gastric emptying 2018  . Diverticulosis    TCS 03/23/11 pancolonic diverticula &TCS 5/08, pancolonic diverticula  . DM (diabetes mellitus) ([place])   . Edema of lower extremity 12/21/12   bilateral   . Esotropia of left eye   . GERD (gastroesophageal reflux disease)   . Glaucoma (increased eye pressure)   . Gout   . Gout   . Hemorrhagic colitis 06/06/2012.  [name] Hemorrhoids, internal 03/23/2011   tcs by Dr. Gala Romney  . Hepatitis    esosiniphilic, tx with prednisone  . Hiatal hernia   . History of radiation therapy 05/21/18- 05/30/18   Left Lung/ 54 Gy delivered in 3 fractions of 18 Gy. SBRT  . HTN (hypertension)   . Hx of radiation therapy 1974   right base of skull area-lymphoma  . Hyperlipidemia   . Lower facial weakness    Right  . Lymphoma ([place]) 1974   XRT at [facility], right base of skull area    . Neuropathy   . Peripheral edema    R>L legs  . Rash    chronic, recurrent, R>L legs  . Renal insufficiency   . Steatohepatitis    liver biopsy 2009  . Stroke [facility]) 1998   right hemiparesis/plegia    Patient Active Problem List   Diagnosis Date Noted  . Hypogonadism, male 07/26/2018  . Gynecomastia, male 06/27/2018  . Malignant neoplasm of upper lobe of left lung ([place]) 04/06/2018  . History of parotid cancer 02/07/2018  . RUQ pain 11/09/2016  . Colon adenomas 11/09/2016  . Primary hypothyroidism 09/09/2015  . Chronic constipation 05/22/2015  . Candidiasis, intertrigo 09/05/2014  . Arthritis, shoulder region 12/10/2013  . Impetigo bullosa 10/23/2013  . Intertrigo 10/12/2013  . Chronic left shoulder pain 09/25/2013  . Muscle weakness (generalized) 09/25/2013  . Hypercalcemia 08/08/2013  . CKD (chronic kidney disease), stage III ([place]) 08/07/2013  . Adhesive capsulitis of left shoulder 05/14/2013  . Steatohepatitis   . Diverticulosis   . GERD (gastroesophageal reflux disease)   . History of CVA (cerebrovascular accident)   . Hiatal hernia   . Mixed hyperlipidemia   . Glaucoma (increased eye pressure)   . Esotropia of left eye   .  BPH (benign prostatic hyperplasia)   . Hx of radiation therapy   . Parotid gland adenocarcinoma ([place]) 11/23/2012  . Epidermal inclusion cyst 10/05/2012  . Neck pain 08/26/2012  . Chronic abdominal pain 08/26/2012  . Leg edema 08/24/2012  . Hemiplegia of dominant side as late effect following cerebrovascular disease ([place]) 06/06/2012  . Diarrhea 04/17/2012  . Obesity 04/17/2012  . Diabetic neuropathy ([place]) 02/13/2012  . Recurrent boils 01/12/2012  . 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 ([place]) 07/15/2011  . Hemorrhoids, internal 03/23/2011  . Hepatitis 03/02/2011  . EOSINOPHILIA 12/23/2009  . Type 2 diabetes mellitus with stage 2 chronic kidney disease, with long-term current use of insulin ([place]) 01/05/2009  . Gout, unspecified  01/05/2009  . Essential hypertension 01/05/2009  . BARRETTS ESOPHAGUS 01/05/2009  . DIVERTICULOSIS OF COLON 01/05/2009  . History of lymphoma 01/05/2009    Past Surgical History:  Procedure Laterality Date  . BIOPSY  12/01/2016   Procedure: BIOPSY;  Surgeon: Daneil Dolin, MD;  Location: [facility];  Service: Endoscopy;;  duodenum, gastric, esophagus  . CHOLECYSTECTOMY    . COLONOSCOPY  03/23/11   Dr. Gala Romney  pancolonic diverticula, hemorrhoids, tubular adenoma.. next tcs 03/2016  . COLONOSCOPY WITH PROPOFOL N/A 12/01/2016   inadequate bowel prep precluded exam  . ESOPHAGOGASTRODUODENOSCOPY  02/05/08   goblet cell metaplasia/negative for H.pylori  . ESOPHAGOGASTRODUODENOSCOPY  03/23/11   Dr. Gala Romney, barretts, hiatal hernia  . ESOPHAGOGASTRODUODENOSCOPY (EGD) WITH PROPOFOL N/A 12/01/2016   Dr. Gala Romney: Large amount of retained gastric contents precluded completion of the stomach. Mucosal changes were found in the stomach. Erosions and somewhat scalloped appearing mucosa present, reactive gastritis/no H pylori. Barrett's esophagus noted, no dysplasia on biopsy. Duodenal biopsies taken as well, benign, no evidence of eosinophilia.  [name] MASS BIOPSY  11/01/2012   Procedure: NECK MASS BIOPSY;  Surgeon: Ascencion Dike, MD;  Location: [facility] ORS;  Service: ENT;  Laterality: Right;  Excisional Bx Right Neck Mass; attempted external jugular cutdown of left side  . PAROTIDECTOMY  11/24/2012   Procedure: PAROTIDECTOMY;  Surgeon: Ascencion Dike, MD;  Location: [facility];  Service: ENT;  Laterality: N/A;  Total parotidectomy  . PLEURECTOMY    . right lymph node removal Right    behind right ear  . Right video-assisted thoracic surgery, pleurectomy, and pleurodesis  2011  . VIDEO BRONCHOSCOPY WITH ENDOBRONCHIAL NAVIGATION N/A 04/26/2018   Procedure: VIDEO BRONCHOSCOPY WITH ENDOBRONCHIAL NAVIGATION;  Surgeon: Melrose Nakayama, MD;  Location: [facility];  Service: Thoracic;  Laterality: N/A;        Home Medications     Prior to Admission medications   Medication Sig Start Date End Date Taking? Authorizing Provider  linagliptin (TRADJENTA) 5 MG TABS tablet Take 1 tablet by mouth daily. 09/09/15  Yes [provider]  allopurinol (ZYLOPRIM) 300 MG tablet TAKE 1 TABLET BY MOUTH EVERY DAY 07/23/18   [name], MD  AMITIZA 24 MCG capsule TAKE 1 CAPSULE(24 MCG) BY MOUTH TWICE DAILY WITH A MEAL 05/08/18   Annitta Needs, NP  amLODipine (NORVASC) 10 MG tablet TAKE 1 TABLET BY MOUTH DAILY 06/12/18   [name], MD  cloNIDine (CATAPRES) 0.2 MG tablet TAKE 1 TABLET BY MOUTH THREE TIMES DAILY 06/08/18   [name], MD  furosemide (LASIX) 40 MG tablet TAKE 1 TABLET BY MOUTH DAILY 06/25/18   [name], MD  gabapentin (NEURONTIN) 300 MG capsule TAKE 2 CAPSULES(600 MG) BY MOUTH THREE TIMES DAILY Patient  taking differently: TAKE 2 CAPSULES(600 MG) BY MOUTH TWICE DAILY, MAY TAKE AN ADDITIONAL 600 MG MID DAY AS NEEDED FOR PAIN 03/12/18   [name], MD  Insulin Glargine (LANTUS SOLOSTAR) 100 UNIT/ML Solostar Pen Inject 60 Units into the skin at bedtime. 04/19/18   Cassandria Anger, MD  levothyroxine (SYNTHROID, LEVOTHROID) 88 MCG tablet TAKE 1 TABLET(88 MCG) BY MOUTH DAILY 05/08/18   [name], MD  lisinopril (PRINIVIL,ZESTRIL) 20 MG tablet TAKE 1 TABLET BY MOUTH DAILY 07/03/18   [name], MD  metFORMIN (GLUCOPHAGE) 500 MG tablet TAKE 1 TABLET BY MOUTH TWICE DAILY 07/18/18   Cassandria Anger, MD  metoprolol succinate (TOPROL-XL) 50 MG 24 hr tablet TAKE 1 TABLET BY MOUTH DAILY WITH OR IMMEDIATELY FOLLOWING A MEAL 06/08/18   [place], [name], MD  [facility] DELICA LANCETS FINE MISC USE AS DIRECTED FOUR TIMES DAILY 11/14/17   Cassandria Anger, MD  [facility] VERIO test strip TEST FOUR TIMES DAILY 06/30/17   Cassandria Anger, MD  oxyCODONE-acetaminophen (PERCOCET) 7.5-325 MG tablet Take 1 tablet by mouth every 4 (four) hours as needed. 06/08/18   [name], MD   pantoprazole (PROTONIX) 40 MG tablet TAKE 1 TABLET BY MOUTH DAILY 07/23/18   [name], MD  potassium chloride SA (K-DUR,KLOR-CON) 20 MEQ tablet TAKE 1 TABLET(20 MEQ) BY MOUTH DAILY 07/03/18   [name], MD  simvastatin (ZOCOR) 20 MG tablet TAKE 1 TABLET BY MOUTH EVERY NIGHT AT BEDTIME 05/24/18   [place], [name], MD  sulfamethoxazole-trimethoprim (BACTRIM DS,SEPTRA DS) 800-160 MG tablet Take 1 tablet by mouth 2 (two) times daily. 06/26/18   [name], MD  tamsulosin (FLOMAX) 0.4 MG CAPS capsule TAKE 1 CAPSULE BY MOUTH DAILY 07/03/18   [name], MD  tiZANidine (ZANAFLEX) 2 MG tablet TAKE 1 TABLET(2 MG) BY MOUTH TWICE DAILY AS NEEDED Patient taking differently: TAKE 1 TABLET(2 MG) BY MOUTH TWICE DAILY AS NEEDED MUSCLE SPASMS 02/20/18   [place], [name], MD  Triamcinolone Acetonide (TRIAMCINOLONE 0.1 % CREAM : EUCERIN) CREA Apply 1 application topically 2 (two) times daily as needed. Patient taking differently: Apply 1 application topically 2 (two) times daily as needed for rash.  02/06/18   [name], MD    Family History Family History  Problem Relation Age of Onset  . Heart failure Mother   . Heart failure Father   . Heart failure Sister   . Heart failure Son     Social History Social History   Tobacco Use  . Smoking status: Former Smoker    Packs/day: 3.00    Years: 25.00    Pack years: 75.00    Types: Cigarettes    Last attempt to quit: 03/01/1997    Years since quitting: 21.4  . Smokeless tobacco: Never Used  Substance Use Topics  . Alcohol use: No  . Drug use: No     Allergies   Patient has no known allergies.   Review of Systems Review of Systems ROS: Statement: All systems negative except as marked or noted in the HPI; Constitutional: Negative for fever and chills. ; ; Eyes: Negative for eye pain, redness and discharge. ; ; ENMT: Negative for ear pain, hoarseness, nasal congestion, sinus pressure and sore throat. ; ;  Cardiovascular: Negative for chest pain, palpitations, diaphoresis, dyspnea and peripheral edema. ; ; Respiratory: Negative for cough, wheezing and stridor. ; ; Gastrointestinal: +chronic abd pain. Negative for nausea, vomiting, diarrhea, blood in stool,  hematemesis, jaundice and rectal bleeding. . ; ; Genitourinary: Negative for dysuria, flank pain and hematuria. ; ; Musculoskeletal: Negative for back pain and neck pain. Negative for swelling and trauma.; ; Skin: Negative for pruritus, rash, abrasions, blisters, bruising and skin lesion.; ; Neuro: Negative for headache, lightheadedness and neck stiffness. Negative for weakness, altered level of consciousness, altered mental status, extremity weakness, paresthesias, involuntary movement, seizure and syncope.       Physical Exam Updated Vital Signs BP (!) 163/104 (BP Location: Left Arm)   Pulse 95   Temp 98.5 F (36.9 C) (Oral)   Resp 20   Wt 121.1 kg   SpO2 97%   BMI 36.21 kg/m   Physical Exam 0855: Physical examination:  Nursing notes reviewed; Vital signs and O2 SAT reviewed;  Constitutional: Well developed, Well nourished, Well hydrated, In no acute distress; Head:  Normocephalic, atraumatic; Eyes: EOMI, PERRL, No scleral icterus; ENMT: Mouth and pharynx normal, Mucous membranes moist; Neck: Supple, Full range of motion, No lymphadenopathy; Cardiovascular: Regular rate and rhythm, No gallop; Respiratory: Breath sounds clear & equal bilaterally, No wheezes.  Speaking full sentences with ease, Normal respiratory effort/excursion; Chest: Nontender, Movement normal; Abdomen: Soft, +mild RUQ tenderness to palp. No rebound or guarding. Nondistended, Normal bowel sounds; Genitourinary: No CVA tenderness; Extremities: Peripheral pulses normal, pedal pulses palp and dopplered.  No tenderness, +2 LLE edema with shallow ulcer with granulation tissue left mid-tibial area without surrounding erythema, no streaking, no drainage. +3 RLE edema with chronic  wound right mid-tibial area, no erythema, no drainage..; Neuro: AA&Ox3, Speech clear. +chronic right sided weakness and RUE contraction per hx, otherwise no new gross focal motor deficits in extremities.; Skin: Color normal, Warm, Dry.   ED Treatments / Results  Labs (all labs ordered are listed, but only abnormal results are displayed)   EKG EKG Interpretation  Date/Time:  Friday July 27 2018 08:31:30 EDT Ventricular Rate:  92 PR Interval:    QRS Duration: 73 QT Interval:  407 QTC Calculation: 504 R Axis:   17 Text Interpretation:  Sinus rhythm Borderline prolonged PR interval Probable left atrial enlargement Abnormal R-wave progression, early transition Left ventricular hypertrophy Prolonged QT interval When compared with ECG of 07/24/2013 and 05/24/2016 Rate slower QT has lengthened Confirmed by Francine Graven [phone]) on 07/27/2018 10:21:16 AM   EKG Interpretation  Date/Time:  Friday July 27 2018 09:54:20 EDT Ventricular Rate:  78 PR Interval:    QRS Duration: 78 QT Interval:  354 QTC Calculation: 404 R Axis:   17 Text Interpretation:  Sinus rhythm Prolonged PR interval LVH by voltage Tall R wave in V2, consider RVH or PMI Since last tracing of earlier today QT has shortened Otherwise no significant change Confirmed by Francine Graven [phone]) on 07/27/2018 10:22:40 AM         Radiology   Procedures Procedures (including critical care time)  Medications Ordered in ED Medications  morphine 2 MG/ML injection 2 mg (has no administration in time range)     Initial Impression / Assessment and Plan / ED Course  I have reviewed the triage vital signs and the nursing notes.  Pertinent labs & imaging results that were available during my care of the patient were reviewed by me and considered in my medical decision making (see chart for details).  MDM Reviewed: previous chart, nursing note and vitals Reviewed previous: labs and ECG Interpretation: labs, ECG,  x-ray, ultrasound and CT scan   Results for orders placed or performed during the hospital  encounter of 07/27/18  Lipase, blood  Result Value Ref Range   Lipase 26 11 - 51 U/L  Comprehensive metabolic panel  Result Value Ref Range   Sodium 140 135 - 145 mmol/L   Potassium 4.4 3.5 - 5.1 mmol/L   Chloride 104 98 - 111 mmol/L   CO2 26 22 - 32 mmol/L   Glucose, Bld 148 (H) 70 - 99 mg/dL   BUN 19 8 - 23 mg/dL   Creatinine, Ser 1.30 (H) 0.61 - 1.24 mg/dL   Calcium 10.8 (H) 8.9 - 10.3 mg/dL   Total Protein 8.0 6.5 - 8.1 g/dL   Albumin 3.9 3.5 - 5.0 g/dL   AST 15 15 - 41 U/L   ALT 14 0 - 44 U/L   Alkaline Phosphatase 79 38 - 126 U/L   Total Bilirubin 0.7 0.3 - 1.2 mg/dL   GFR calc non Af Amer 54 (L) >60 mL/min   GFR calc Af Amer >60 >60 mL/min   Anion gap 10 5 - 15  CBC  Result Value Ref Range   WBC 7.6 4.0 - 10.5 K/uL   RBC 5.48 4.22 - 5.81 MIL/uL   Hemoglobin 13.2 13.0 - 17.0 g/dL   HCT 41.7 39.0 - 52.0 %   MCV 76.1 (L) 78.0 - 100.0 fL   MCH 24.1 (L) 26.0 - 34.0 pg   MCHC 31.7 30.0 - 36.0 g/dL   RDW 18.2 (H) 11.5 - 15.5 %   Platelets 244 150 - 400 K/uL  Urinalysis, Routine w reflex microscopic  Result Value Ref Range   Color, Urine YELLOW YELLOW   APPearance CLEAR CLEAR   Specific Gravity, Urine 1.015 1.005 - 1.030   pH 5.0 5.0 - 8.0   Glucose, UA NEGATIVE NEGATIVE mg/dL   Hgb urine dipstick SMALL (A) NEGATIVE   Bilirubin Urine NEGATIVE NEGATIVE   Ketones, ur NEGATIVE NEGATIVE mg/dL   Protein, ur >=300 (A) NEGATIVE mg/dL   Nitrite NEGATIVE NEGATIVE   Leukocytes, UA NEGATIVE NEGATIVE   RBC / HPF 0-5 0 - 5 RBC/hpf   WBC, UA 0-5 0 - 5 WBC/hpf   Bacteria, UA NONE SEEN NONE SEEN   Squamous Epithelial / LPF 0-5 0 - 5   Mucus PRESENT   Differential  Result Value Ref Range   Neutrophils Relative % 73 %   Lymphocytes Relative 12 %   Monocytes Relative 6 %   Eosinophils Relative 8 %   Basophils Relative 1 %   RBC Morphology Schistocytes present    Neutro Abs 5.1 1.7 -  7.7 K/uL   Lymphs Abs 0.9 0.7 - 4.0 K/uL   Monocytes Absolute 0.4 0.1 - 1.0 K/uL   Eosinophils Absolute 0.6 0.0 - 0.7 K/uL   Basophils Absolute 0.1 0.0 - 0.1 K/uL  Troponin I  Result Value Ref Range   Troponin I <0.03 <0.03 ng/mL   Ct Abdomen Pelvis Wo Contrast Result Date: 07/27/2018 CLINICAL DATA:  Acute on chronic abdominal pain. History of left lung cancer. EXAM: CT ABDOMEN AND PELVIS WITHOUT CONTRAST TECHNIQUE: Multidetector CT imaging of the abdomen and pelvis was performed following the standard protocol without IV contrast. COMPARISON:  03/21/2018 PET-CT.  05/23/2016 CT abdomen/pelvis. FINDINGS: Lower chest: No significant pulmonary nodules or acute consolidative airspace disease. Hepatobiliary: Normal liver size. Two subcentimeter left superior liver lobe lesions are too small to characterize and are stable since 05/23/2016, considered benign. No new liver lesions. Cholecystectomy. No biliary ductal dilatation. Pancreas: Normal, with no mass or duct dilation. Spleen:  Normal size. No mass. Adrenals/Urinary Tract: No discrete adrenal nodules. No hydronephrosis. No renal stones. Several simple renal cysts in both kidneys, largest 4.0 cm in the posterior lower right kidney and 4.8 cm in the lower left kidney. Normal bladder. Stomach/Bowel: Small hiatal hernia. Otherwise normal nondistended stomach. Normal caliber small bowel with no small bowel wall thickening. Normal appendix. Marked diffuse colonic diverticulosis, with no definite large bowel wall thickening or significant pericolonic fat stranding. Vascular/Lymphatic: Atherosclerotic nonaneurysmal abdominal aorta. No pathologically enlarged lymph nodes in the abdomen or pelvis. Reproductive: Moderately enlarged prostate. Other: No pneumoperitoneum, ascites or focal fluid collection. Small to moderate fat containing midline supraumbilical ventral abdominal hernia, stable. Musculoskeletal: Chronic lytic change throughout L2 vertebral body is  unchanged since 05/23/2016 CT and was not hypermetabolic on recent PET-CT. Moderate thoracolumbar spondylosis. Healed deformities in the lower right ribs. IMPRESSION: 1. No acute abnormality. No evidence of bowel obstruction or acute bowel inflammation. Normal appendix. Marked colonic diverticulosis, with no evidence of acute diverticulitis. 2. Small hiatal hernia. 3. Moderately enlarged prostate. 4. Stable small to moderate fat containing supraumbilical ventral abdominal hernia. 5.  Aortic Atherosclerosis (ICD10-I70.0). Electronically Signed   By: Ilona Sorrel M.D.   On: 07/27/2018 10:02   Dg Chest 2 View Result Date: 07/27/2018 CLINICAL DATA:  Chronic abdominal pain EXAM: CHEST - 2 VIEW COMPARISON:  04/26/2018 FINDINGS: There is mild left apical scarring. There is no focal consolidation. There is no pleural effusion or pneumothorax. The heart and mediastinal contours are unremarkable. The osseous structures are unremarkable. IMPRESSION: No active cardiopulmonary disease. Electronically Signed   By: Kathreen Devoid   On: 07/27/2018 09:33   US Venous Img Lower Right (dvt Study) Result Date: 07/27/2018 CLINICAL DATA:  72 year old with lower extremity edema, right side greater than left. EXAM: RIGHT LOWER EXTREMITY VENOUS DOPPLER ULTRASOUND TECHNIQUE: Gray-scale sonography with graded compression, as well as color Doppler and duplex ultrasound were performed to evaluate the lower extremity deep venous systems from the level of the common femoral vein and including the common femoral, femoral, profunda femoral, popliteal and calf veins including the posterior tibial, peroneal and gastrocnemius veins when visible. The superficial great saphenous vein was also interrogated. Spectral Doppler was utilized to evaluate flow at rest and with distal augmentation maneuvers in the common femoral, femoral and popliteal veins. COMPARISON:  None. FINDINGS: Contralateral Common Femoral Vein: Respiratory phasicity is normal and  symmetric with the symptomatic side. No evidence of thrombus. Normal compressibility. Common Femoral Vein: No evidence of thrombus. Normal compressibility, respiratory phasicity and response to augmentation. Saphenofemoral Junction: No evidence of thrombus. Normal compressibility and flow on color Doppler imaging. Profunda Femoral Vein: No evidence of thrombus. Normal compressibility and flow on color Doppler imaging. Femoral Vein: No evidence of thrombus. Normal compressibility, respiratory phasicity and response to augmentation. Popliteal Vein: No evidence of thrombus. Normal compressibility, respiratory phasicity and response to augmentation. Calf Veins: Visualized right deep calf veins are patent without thrombus. Limited evaluation of the deep calf veins. Other Findings:  Subcutaneous edema. IMPRESSION: Negative for deep venous thrombosis in right lower extremity. Electronically Signed   By: Markus Daft M.D.   On: 07/27/2018 09:49    1300:  Labs per baseline. Workup reassuring. Pt has tol PO well while in the ED without N/V.  No stooling while in the ED.  Abd benign, VSS. Feels better and wants to go home now. Tx symptomatically at this time. Pt already has rx pain meds. Dx and testing d/w pt and family.  Questions answered.  Verb understanding, agreeable to d/c home with outpt f/u.    Final Clinical Impressions(s) / ED Diagnoses   Final diagnoses:  None    ED Discharge Orders    None       Francine Graven, DO 07/29/18 1538

## 2018-07-27 NOTE — ED Triage Notes (Signed)
Wife reports that pt has had problems with stomach pain for " awhile". Pain seems worse this morning. Pain in located right side and hurts to touch. Wife feels that right side looks swollen. Normal BM yesterday. NO vomitng/ nausea

## 2018-08-01 ENCOUNTER — Other Ambulatory Visit: Payer: Self-pay | Admitting: Family Medicine

## 2018-08-16 ENCOUNTER — Other Ambulatory Visit: Payer: Self-pay | Admitting: *Deleted

## 2018-08-16 MED ORDER — OXYCODONE-ACETAMINOPHEN 7.5-325 MG PO TABS: 1.0000 | ORAL_TABLET | ORAL | 0 refills | Status: DC | PRN

## 2018-08-16 NOTE — Telephone Encounter (Signed)
Ok to refill??  Last office visit 06/26/2018.  Last refill 06/08/2018.

## 2018-08-28 NOTE — Progress Notes (Signed)
[name] presents for follow up of radiation completed 05/30/18 to his left lung in 3 fractions. He had a PET scan on 08/30/18 and is here for the results. He reports chronic pain to his left shoulder. He reports a pain to his right chest that comes and goes daily. He denies difficulty breathing. He is eating and drinking well.   BP 135/79 (BP Location: Left Arm)   Pulse 70   Temp 98.3 F (36.8 C) (Oral)   SpO2 100% Comment: room air   Wt Readings from Last 3 Encounters:  07/27/18 266 lb 15.6 oz (121.1 kg)  04/18/18 267 lb (121.1 kg)  04/13/18 267 lb (121.1 kg)

## 2018-08-29 ENCOUNTER — Other Ambulatory Visit: Payer: Self-pay | Admitting: Family Medicine

## 2018-08-30 ENCOUNTER — Encounter (HOSPITAL_COMMUNITY)
Admission: RE | Admit: 2018-08-30 | Discharge: 2018-08-30 | Disposition: A | Discharge status: Home / Self Care | Payer: Medicare Other | Source: Ambulatory Visit | Attending: Radiation Oncology | Admitting: Radiation Oncology

## 2018-08-30 DIAGNOSIS — C3490 Malignant neoplasm of unspecified part of unspecified bronchus or lung: Secondary | ICD-10-CM | POA: Diagnosis not present

## 2018-08-30 DIAGNOSIS — C3412 Malignant neoplasm of upper lobe, left bronchus or lung: Secondary | ICD-10-CM | POA: Diagnosis not present

## 2018-08-30 DIAGNOSIS — N62 Hypertrophy of breast: Secondary | ICD-10-CM | POA: Insufficient documentation

## 2018-08-30 DIAGNOSIS — E278 Other specified disorders of adrenal gland: Secondary | ICD-10-CM

## 2018-08-30 DIAGNOSIS — E279 Disorder of adrenal gland, unspecified: Secondary | ICD-10-CM | POA: Diagnosis not present

## 2018-08-30 LAB — GLUCOSE, CAPILLARY: GLUCOSE-CAPILLARY: 92 mg/dL (ref 70–99)

## 2018-08-30 MED ORDER — FLUDEOXYGLUCOSE F - 18 (FDG) INJECTION
13.2000 | Freq: Once | INTRAVENOUS | Status: AC
  Administered 2018-08-30: 13.2 via INTRAVENOUS

## 2018-08-31 ENCOUNTER — Encounter: Payer: Self-pay | Admitting: Radiation Oncology

## 2018-08-31 ENCOUNTER — Other Ambulatory Visit: Payer: Self-pay

## 2018-08-31 ENCOUNTER — Ambulatory Visit
Admission: RE | Admit: 2018-08-31 | Discharge: 2018-08-31 | Disposition: A | Discharge status: Home / Self Care | Payer: Medicare Other | Source: Ambulatory Visit | Attending: Radiation Oncology | Admitting: Radiation Oncology

## 2018-08-31 VITALS — BP 135/79 | HR 70 | Temp 98.3°F

## 2018-08-31 DIAGNOSIS — I2510 Atherosclerotic heart disease of native coronary artery without angina pectoris: Secondary | ICD-10-CM | POA: Diagnosis not present

## 2018-08-31 DIAGNOSIS — Z923 Personal history of irradiation: Secondary | ICD-10-CM | POA: Insufficient documentation

## 2018-08-31 DIAGNOSIS — Z794 Long term (current) use of insulin: Secondary | ICD-10-CM | POA: Diagnosis not present

## 2018-08-31 DIAGNOSIS — Z79899 Other long term (current) drug therapy: Secondary | ICD-10-CM | POA: Insufficient documentation

## 2018-08-31 DIAGNOSIS — Z85118 Personal history of other malignant neoplasm of bronchus and lung: Secondary | ICD-10-CM | POA: Diagnosis not present

## 2018-08-31 DIAGNOSIS — Z08 Encounter for follow-up examination after completed treatment for malignant neoplasm: Secondary | ICD-10-CM | POA: Diagnosis not present

## 2018-08-31 DIAGNOSIS — C3412 Malignant neoplasm of upper lobe, left bronchus or lung: Secondary | ICD-10-CM | POA: Insufficient documentation

## 2018-08-31 NOTE — Progress Notes (Signed)
Radiation Oncology         [phone] ________________________________  Name: [name] MRN: [id number]  Date: 08/31/2018  DOB: 05/25/1946  Follow-Up Visit Note  Outpatient  CC: Cecil, [name], MD  [name], MD  Diagnosis and Prior Radiotherapy:    ICD-10-CM   1. Malignant neoplasm of upper lobe of left lung ([place]) C34.12      Non small cell lung cancer - adenocarcinoma Cancer Staging Malignant neoplasm of upper lobe of left lung ([place]) Staging form: Lung, AJCC 8th Edition - Clinical: Stage IB (cT2a, cN0, cM0) - Signed by [name], MD on 04/06/2018  05/21/2018 - 05/30/2018: Lung_Lt/ 54 Gy delivered in 3 fractions of 18 Gy  CHIEF COMPLAINT: Here for follow-up and surveillance of lung cancer  Narrative:  The patient returns today for routine follow-up of radiation completed 05/30/2018 to his left lung in 3 fractions. He is accompanied by his wife today. He was recently seen by his PCP. He denies dry cough, increased SOB, and any other new symptoms.   Since his last visit to the office, he underwent a restaging PET scan on 08/30/2018 that showed: Reduced size and activity in the left upper lobe mass, current SUV 3.5 and previously 14.8. However, there surrounding airspace opacities with maximum SUV up to 6.4, most likely from radiation pneumonitis. Both adrenal glands continue to demonstrate slight thickening without overt nodularity and low-grade activity, maximum SUV on the left 5.1 (formerly 4.6) and on the right 4.3 (formerly 4.2). Looking back at the prior CT scans all the way back through 11/17/2003, the adrenal glands have a similar contour. Accordingly, I favor the appearance as being probably benign but meriting surveillance. 3. Hypo activity in the left cerebral hemisphere compatible with the prior infarct. There is some stable relative hypo activity in the right cerebellum compared to the left, significance uncertain, but not changed from prior. Other imaging  findings of potential clinical significance: Aortic Atherosclerosis (ICD10-I70.0) and Emphysema (ICD10-J43.9). Coronary atherosclerosis. Small type 1 hiatal hernia. Prominent stool throughout the colon favors constipation. Prostatomegaly. Severeleft glenohumeral arthropathy.  ALLERGIES:  has No Known Allergies.  Meds: Current Outpatient Medications  Medication Sig Dispense Refill  . allopurinol (ZYLOPRIM) 300 MG tablet TAKE 1 TABLET BY MOUTH EVERY DAY 90 tablet 0  . AMITIZA 24 MCG capsule TAKE 1 CAPSULE(24 MCG) BY MOUTH TWICE DAILY WITH A MEAL 60 capsule 3  . amLODipine (NORVASC) 10 MG tablet TAKE 1 TABLET BY MOUTH DAILY 90 tablet 0  . cloNIDine (CATAPRES) 0.2 MG tablet TAKE 1 TABLET BY MOUTH THREE TIMES DAILY 270 tablet 0  . famotidine (PEPCID) 20 MG tablet Take 1 tablet (20 mg total) by mouth daily. 15 tablet 0  . furosemide (LASIX) 40 MG tablet TAKE 1 TABLET BY MOUTH DAILY 90 tablet 0  . gabapentin (NEURONTIN) 300 MG capsule TAKE 2 CAPSULES(600 MG) BY MOUTH THREE TIMES DAILY (Patient taking differently: TAKE 2 CAPSULES(600 MG) BY MOUTH TWICE DAILY, MAY TAKE AN ADDITIONAL 600 MG MID DAY AS NEEDED FOR PAIN) 540 capsule 0  . Insulin Glargine (LANTUS SOLOSTAR) 100 UNIT/ML Solostar Pen Inject 60 Units into the skin at bedtime. 15 mL 2  . lisinopril (PRINIVIL,ZESTRIL) 20 MG tablet TAKE 1 TABLET BY MOUTH DAILY 90 tablet 0  . metFORMIN (GLUCOPHAGE) 500 MG tablet TAKE 1 TABLET BY MOUTH TWICE DAILY 180 tablet 0  . ONETOUCH DELICA LANCETS FINE MISC USE AS DIRECTED FOUR TIMES DAILY 200 each 2  . ONETOUCH VERIO test strip TEST  FOUR TIMES DAILY 150 each 5  . oxyCODONE-acetaminophen (PERCOCET) 7.5-325 MG tablet Take 1 tablet by mouth every 4 (four) hours as needed. 180 tablet 0  . pantoprazole (PROTONIX) 40 MG tablet TAKE 1 TABLET BY MOUTH DAILY 90 tablet 0  . potassium chloride SA (K-DUR,KLOR-CON) 20 MEQ tablet TAKE 1 TABLET(20 MEQ) BY MOUTH DAILY 30 tablet 0  . simvastatin (ZOCOR) 20 MG tablet TAKE 1  TABLET BY MOUTH EVERY NIGHT AT BEDTIME 90 tablet 0  . tamsulosin (FLOMAX) 0.4 MG CAPS capsule TAKE 1 CAPSULE BY MOUTH DAILY 90 capsule 0  . Triamcinolone Acetonide (TRIAMCINOLONE 0.1 % CREAM : EUCERIN) CREA Apply 1 application topically 2 (two) times daily as needed. (Patient taking differently: Apply 1 application topically 2 (two) times daily as needed for rash. ) 1 each 2   No current facility-administered medications for this encounter.     Physical Findings: The patient is in no acute distress. Patient is alert and oriented. Patient in wheelchair.   oral temperature is 98.3 F (36.8 C). His blood pressure is 135/79 and his pulse is 70. His oxygen saturation is 100%. [name]    Heart: Systolic murmur in the aortic valve region. Regular in rate and rhythm with no rubs, or gallops. Chest: Clear to auscultation bilaterally, with no rhonchi, wheezes, or rales.   Lab Findings: Lab Results  Component Value Date   WBC 7.6 07/27/2018   HGB 13.2 07/27/2018   HCT 41.7 07/27/2018   MCV 76.1 (L) 07/27/2018   PLT 244 07/27/2018    Radiographic Findings: Nm Pet Image Restag (ps) Skull Base To Thigh  Result Date: 08/31/2018 CLINICAL DATA:  Subsequent treatment strategy for non-small cell lung cancer with adrenal mass. EXAM: NUCLEAR MEDICINE PET SKULL BASE TO THIGH TECHNIQUE: 13.2 mCi F-18 FDG was injected intravenously. Full-ring PET imaging was performed from the skull base to thigh after the radiotracer. CT data was obtained and used for attenuation correction and anatomic localization. Fasting blood glucose: 92 mg/dl COMPARISON:  Multiple exams, including 03/21/2018 FINDINGS: Mediastinal blood pool activity: SUV max 3.1 NECK: Hypo activity in the left cerebral hemisphere compatible with prior infarct. There is stable relative hypo activity in the right cerebellum compared to the left, significance uncertain. Physiologic muscular activity in the neck. Stable hypodense lesion superficial to the left  parotid gland, photopenic likely a cyst. Incidental CT findings: Suspected postoperative findings along the inferior margin of the right parotid gland and overlying neck. Atherosclerotic calcification of the common carotid arteries. CHEST: Prior left upper lobe mass currently measures 2.9 by 1.8 cm (formerly 3.9 by 2.6 cm) and has a maximum SUV of 3.5 (previously 14.8). However, there are surrounding airspace opacities in the left upper lobe and superior segment left lower lobe probably from radiation pneumonitis with a maximum SUV of 6.4. Incidental CT findings: Emphysema. Coronary, aortic arch, and branch vessel atherosclerotic vascular disease. Small type 1 hiatal hernia. ABDOMEN/PELVIS: Stable thickening without overt nodularity of both adrenal glands, left adrenal maximum SUV 5.1 (formerly 4.6), right adrenal activity 4.3 (formerly 4.2). Scattered bowel activity, likely physiologic. Photopenic renal cysts. Incidental CT findings: Prominent stool throughout the colon favors constipation. Prostatomegaly. Aortoiliac atherosclerotic vascular disease. SKELETON: Accentuated activity along the margins of the left glenohumeral joint along with severe osteoarthritis, appearance compatible with local synovitis and inflammation. Incidental note is made of radiopharmaceutical in the left antecubital subcutaneous tissues. This is confirmed to be the site of injection and likely represents local extravasation of radiopharmaceutical. Incidental CT findings: Trabecular coarsening  in the L3 vertebral body favoring hemangioma. IMPRESSION: 1. Reduced size and activity in the left upper lobe mass, current SUV 3.5 and previously 14.8. However, there surrounding airspace opacities with maximum SUV up to 6.4, most likely from radiation pneumonitis. 2. Both adrenal glands continue to demonstrate slight thickening without overt nodularity and low-grade activity, maximum SUV on the left 5.1 (formerly 4.6) and on the right 4.3 (formerly  4.2). Looking back at the prior CT scans all the way back through 11/17/2003, the adrenal glands have a similar contour. Accordingly, I favor the appearance as being probably benign but meriting surveillance. 3. Hypo activity in the left cerebral hemisphere compatible with the prior infarct. There is some stable relative hypo activity in the right cerebellum compared to the left, significance uncertain, but not changed from prior. 4. Other imaging findings of potential clinical significance: Aortic Atherosclerosis (ICD10-I70.0) and Emphysema (ICD10-J43.9). Coronary atherosclerosis. Small type 1 hiatal hernia. Prominent stool throughout the colon favors constipation. Prostatomegaly. Severe left glenohumeral arthropathy. Electronically Signed   By: Van Clines M.D.   On: 08/31/2018 08:56    Impression/Plan:  Good response to radiotherapy without any lingering side effects from treatment.   I reviewed the recent restaging PET scan with the patient and his wife today in great detail.   Follow up in 4 months with a repeat Chest CT at that time.   _____________________________________   [name], MD   This document serves as a record of services personally performed by [name], MD. It was created on her behalf by [facility], a trained medical scribe. The creation of this record is based on the scribe's personal observations and the provider's statements to them. This document has been checked and approved by the attending provider.

## 2018-09-01 ENCOUNTER — Other Ambulatory Visit: Payer: Self-pay | Admitting: "Endocrinology

## 2018-09-01 ENCOUNTER — Other Ambulatory Visit: Payer: Self-pay | Admitting: Family Medicine

## 2018-09-03 ENCOUNTER — Encounter: Payer: Self-pay | Admitting: Radiation Oncology

## 2018-09-03 ENCOUNTER — Other Ambulatory Visit: Payer: Self-pay | Admitting: Radiation Oncology

## 2018-09-03 DIAGNOSIS — C3412 Malignant neoplasm of upper lobe, left bronchus or lung: Secondary | ICD-10-CM

## 2018-09-04 ENCOUNTER — Other Ambulatory Visit: Payer: Self-pay | Admitting: Family Medicine

## 2018-09-07 ENCOUNTER — Other Ambulatory Visit: Payer: Self-pay | Admitting: Family Medicine

## 2018-09-15 ENCOUNTER — Other Ambulatory Visit: Payer: Self-pay | Admitting: Family Medicine

## 2018-09-16 ENCOUNTER — Other Ambulatory Visit: Payer: Self-pay | Admitting: Family Medicine

## 2018-09-17 ENCOUNTER — Other Ambulatory Visit: Payer: Self-pay | Admitting: Family Medicine

## 2018-09-25 ENCOUNTER — Other Ambulatory Visit: Payer: Self-pay | Admitting: Family Medicine

## 2018-09-29 ENCOUNTER — Other Ambulatory Visit: Payer: Self-pay | Admitting: Family Medicine

## 2018-09-30 ENCOUNTER — Other Ambulatory Visit: Payer: Self-pay | Admitting: Family Medicine

## 2018-10-01 DIAGNOSIS — I6789 Other cerebrovascular disease: Secondary | ICD-10-CM | POA: Diagnosis not present

## 2018-10-01 DIAGNOSIS — I6350 Cerebral infarction due to unspecified occlusion or stenosis of unspecified cerebral artery: Secondary | ICD-10-CM | POA: Diagnosis not present

## 2018-10-02 ENCOUNTER — Other Ambulatory Visit: Payer: Self-pay | Admitting: Family Medicine

## 2018-10-02 ENCOUNTER — Ambulatory Visit (INDEPENDENT_AMBULATORY_CARE_PROVIDER_SITE_OTHER): Payer: Medicare Other

## 2018-10-02 DIAGNOSIS — Z23 Encounter for immunization: Secondary | ICD-10-CM

## 2018-10-02 NOTE — Progress Notes (Signed)
Patient was in office for high dose flu vaccine.Patient received vaccine in his left deltoid patient tolerated well.

## 2018-10-13 ENCOUNTER — Other Ambulatory Visit: Payer: Self-pay | Admitting: "Endocrinology

## 2018-10-16 DIAGNOSIS — E119 Type 2 diabetes mellitus without complications: Secondary | ICD-10-CM | POA: Diagnosis not present

## 2018-10-16 DIAGNOSIS — H52201 Unspecified astigmatism, right eye: Secondary | ICD-10-CM | POA: Diagnosis not present

## 2018-10-16 DIAGNOSIS — H524 Presbyopia: Secondary | ICD-10-CM | POA: Diagnosis not present

## 2018-10-16 DIAGNOSIS — Z794 Long term (current) use of insulin: Secondary | ICD-10-CM | POA: Diagnosis not present

## 2018-10-16 DIAGNOSIS — H5213 Myopia, bilateral: Secondary | ICD-10-CM | POA: Diagnosis not present

## 2018-10-16 LAB — HM DIABETES EYE EXAM

## 2018-10-20 ENCOUNTER — Other Ambulatory Visit: Payer: Self-pay | Admitting: Family Medicine

## 2018-10-25 DIAGNOSIS — E1122 Type 2 diabetes mellitus with diabetic chronic kidney disease: Secondary | ICD-10-CM | POA: Diagnosis not present

## 2018-10-25 DIAGNOSIS — E039 Hypothyroidism, unspecified: Secondary | ICD-10-CM | POA: Diagnosis not present

## 2018-10-25 DIAGNOSIS — N182 Chronic kidney disease, stage 2 (mild): Secondary | ICD-10-CM | POA: Diagnosis not present

## 2018-10-25 DIAGNOSIS — Z794 Long term (current) use of insulin: Secondary | ICD-10-CM | POA: Diagnosis not present

## 2018-10-27 ENCOUNTER — Encounter: Payer: Self-pay | Admitting: *Deleted

## 2018-10-29 ENCOUNTER — Other Ambulatory Visit: Payer: Self-pay | Admitting: "Endocrinology

## 2018-10-29 ENCOUNTER — Encounter: Payer: Self-pay | Admitting: "Endocrinology

## 2018-10-29 ENCOUNTER — Ambulatory Visit (INDEPENDENT_AMBULATORY_CARE_PROVIDER_SITE_OTHER): Payer: Medicare Other | Admitting: "Endocrinology

## 2018-10-29 ENCOUNTER — Other Ambulatory Visit: Payer: Self-pay | Admitting: Family Medicine

## 2018-10-29 VITALS — BP 152/89 | HR 71 | Ht 72.0 in

## 2018-10-29 DIAGNOSIS — E1122 Type 2 diabetes mellitus with diabetic chronic kidney disease: Secondary | ICD-10-CM | POA: Diagnosis not present

## 2018-10-29 DIAGNOSIS — E782 Mixed hyperlipidemia: Secondary | ICD-10-CM | POA: Diagnosis not present

## 2018-10-29 DIAGNOSIS — E039 Hypothyroidism, unspecified: Secondary | ICD-10-CM | POA: Diagnosis not present

## 2018-10-29 DIAGNOSIS — N182 Chronic kidney disease, stage 2 (mild): Secondary | ICD-10-CM | POA: Diagnosis not present

## 2018-10-29 DIAGNOSIS — I10 Essential (primary) hypertension: Secondary | ICD-10-CM | POA: Diagnosis not present

## 2018-10-29 DIAGNOSIS — Z794 Long term (current) use of insulin: Secondary | ICD-10-CM

## 2018-10-29 MED ORDER — INSULIN GLARGINE 100 UNIT/ML SOLOSTAR PEN: 50.0000 [IU] | PEN_INJECTOR | Freq: Every day | SUBCUTANEOUS | 2 refills | Status: DC

## 2018-10-29 NOTE — Progress Notes (Signed)
Endocrinology follow-up note   Subjective:    Patient ID: [name], male    DOB: 25-Oct-1946,    Past Medical History:  Diagnosis Date  . Arthritis   . Asthma    "hx of"  . Barrett's esophagus    EGD 03/23/2011 & EGD 2/09 bx proven  . BPH (benign prostatic hyperplasia)   . Cancer of parotid gland ([place]) 11/23/12   Adenocarcinoma  . Chronic abdominal pain   . Chronic constipation   . Colon polyp 03/23/2011   tubular adenoma, Dr. Gala Romney  . 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 ([place]) 07/15/2011  . CVA (cerebral infarction) 1998   right sided deficit  . Delayed gastric emptying 2018  . Diverticulosis    TCS 03/23/11 pancolonic diverticula &TCS 5/08, pancolonic diverticula  . DM (diabetes mellitus) ([place])   . Edema of lower extremity 12/21/12   bilateral   . Esotropia of left eye   . GERD (gastroesophageal reflux disease)   . Glaucoma (increased eye pressure)   . Gout   . Gout   . Hemorrhagic colitis 06/06/2012.  [name] Hemorrhoids, internal 03/23/2011   tcs by Dr. Gala Romney  . Hepatitis    esosiniphilic, tx with prednisone  . Hiatal hernia   . History of radiation therapy 05/21/18- 05/30/18   Left Lung/ 54 Gy delivered in 3 fractions of 18 Gy. SBRT  . HTN (hypertension)   . Hx of radiation therapy 1974   right base of skull area-lymphoma  . Hyperlipidemia   . Lower facial weakness    Right  . Lymphoma ([place]) 1974   XRT at [facility], right base of skull area  . Neuropathy   . Peripheral edema    R>L legs  . Rash    chronic, recurrent, R>L legs  . Renal insufficiency   . Steatohepatitis    liver biopsy 2009  . Stroke [facility]) 1998   right hemiparesis/plegia   Past Surgical History:  Procedure Laterality Date  . BIOPSY  12/01/2016   Procedure: BIOPSY;  Surgeon: Daneil Dolin, MD;  Location: [facility] ENDO SUITE;  Service: Endoscopy;;  duodenum, gastric, esophagus  . CHOLECYSTECTOMY    . COLONOSCOPY  03/23/11   Dr. Gala Romney  pancolonic diverticula, hemorrhoids, tubular  adenoma.. next tcs 03/2016  . COLONOSCOPY WITH PROPOFOL N/A 12/01/2016   inadequate bowel prep precluded exam  . ESOPHAGOGASTRODUODENOSCOPY  02/05/08   goblet cell metaplasia/negative for H.pylori  . ESOPHAGOGASTRODUODENOSCOPY  03/23/11   Dr. Gala Romney, barretts, hiatal hernia  . ESOPHAGOGASTRODUODENOSCOPY (EGD) WITH PROPOFOL N/A 12/01/2016   Dr. Gala Romney: Large amount of retained gastric contents precluded completion of the stomach. Mucosal changes were found in the stomach. Erosions and somewhat scalloped appearing mucosa present, reactive gastritis/no H pylori. Barrett's esophagus noted, no dysplasia on biopsy. Duodenal biopsies taken as well, benign, no evidence of eosinophilia.  [name] MASS BIOPSY  11/01/2012   Procedure: NECK MASS BIOPSY;  Surgeon: Ascencion Dike, MD;  Location: [facility] ORS;  Service: ENT;  Laterality: Right;  Excisional Bx Right Neck Mass; attempted external jugular cutdown of left side  . PAROTIDECTOMY  11/24/2012   Procedure: PAROTIDECTOMY;  Surgeon: Ascencion Dike, MD;  Location: [facility];  Service: ENT;  Laterality: N/A;  Total parotidectomy  . PLEURECTOMY    . right lymph node removal Right    behind right ear  . Right video-assisted thoracic surgery, pleurectomy, and pleurodesis  2011  . VIDEO BRONCHOSCOPY WITH ENDOBRONCHIAL NAVIGATION N/A 04/26/2018   Procedure: VIDEO  BRONCHOSCOPY WITH ENDOBRONCHIAL NAVIGATION;  Surgeon: Melrose Nakayama, MD;  Location: [facility] OR;  Service: Thoracic;  Laterality: N/A;   Social History   Socioeconomic History  . Marital status: Married    Spouse name: Not on file  . Number of children: 1  . Years of education: Not on file  . Highest education level: Not on file  Occupational History  . Occupation: disabled  Social Needs  . Financial resource strain: Not on file  . Food insecurity:    Worry: Not on file    Inability: Not on file  . Transportation needs:    Medical: Not on file    Non-medical: Not on file  Tobacco Use  . Smoking status: Former  Smoker    Packs/day: 3.00    Years: 25.00    Pack years: 75.00    Types: Cigarettes    Last attempt to quit: 03/01/1997    Years since quitting: 21.6  . Smokeless tobacco: Never Used  Substance and Sexual Activity  . Alcohol use: No  . Drug use: No  . Sexual activity: Not Currently  Lifestyle  . Physical activity:    Days per week: Not on file    Minutes per session: Not on file  . Stress: Not on file  Relationships  . Social connections:    Talks on phone: Not on file    Gets together: Not on file    Attends religious service: Not on file    Active member of club or organization: Not on file    Attends meetings of clubs or organizations: Not on file    Relationship status: Not on file  Other Topics Concern  . Not on file  Social History Narrative  . Not on file   Outpatient Encounter Medications as of 10/29/2018  Medication Sig  . allopurinol (ZYLOPRIM) 300 MG tablet TAKE 1 TABLET BY MOUTH EVERY DAY  . AMITIZA 24 MCG capsule TAKE 1 CAPSULE(24 MCG) BY MOUTH TWICE DAILY WITH A MEAL  . amLODipine (NORVASC) 10 MG tablet TAKE 1 TABLET BY MOUTH DAILY  . cloNIDine (CATAPRES) 0.2 MG tablet TAKE 1 TABLET BY MOUTH THREE TIMES DAILY  . famotidine (PEPCID) 20 MG tablet Take 1 tablet (20 mg total) by mouth daily.  . furosemide (LASIX) 40 MG tablet TAKE 1 TABLET BY MOUTH DAILY  . gabapentin (NEURONTIN) 300 MG capsule TAKE 2 CAPSULES(600 MG) BY MOUTH TWICE DAILY, MAY TAKE AN ADDITIONAL 600 MG MID DAY AS NEEDED FOR PAIN  . Insulin Glargine (LANTUS SOLOSTAR) 100 UNIT/ML Solostar Pen Inject 50 Units into the skin at bedtime.  [name] lisinopril (PRINIVIL,ZESTRIL) 20 MG tablet TAKE 1 TABLET BY MOUTH DAILY  . metFORMIN (GLUCOPHAGE) 500 MG tablet TAKE 1 TABLET BY MOUTH TWICE DAILY  . metoprolol succinate (TOPROL-XL) 50 MG 24 hr tablet TAKE 1 TABLET BY MOUTH DAILY WITH OR IMMEDIATELY FOLLOWING A MEAL  . ONETOUCH DELICA LANCETS FINE MISC USE AS DIRECTED FOUR TIMES DAILY  . oxyCODONE-acetaminophen  (PERCOCET) 7.5-325 MG tablet Take 1 tablet by mouth every 4 (four) hours as needed.  . pantoprazole (PROTONIX) 40 MG tablet TAKE 1 TABLET BY MOUTH DAILY  . simvastatin (ZOCOR) 20 MG tablet TAKE 1 TABLET BY MOUTH EVERY NIGHT AT BEDTIME  . tamsulosin (FLOMAX) 0.4 MG CAPS capsule TAKE 1 CAPSULE BY MOUTH DAILY  . Triamcinolone Acetonide (TRIAMCINOLONE 0.1 % CREAM : EUCERIN) CREA Apply 1 application topically 2 (two) times daily as needed. (Patient taking differently: Apply 1 application topically 2 (two)  times daily as needed for rash. )  . [DISCONTINUED] LANTUS SOLOSTAR 100 UNIT/ML Solostar Pen ADMINISTER 60 UNITS UNDER THE SKIN AT BEDTIME  . [DISCONTINUED] ONETOUCH VERIO test strip TEST FOUR TIMES DAILY  . [DISCONTINUED] potassium chloride SA (K-DUR,KLOR-CON) 20 MEQ tablet TAKE 1 TABLET(20 MEQ) BY MOUTH DAILY   No facility-administered encounter medications on file as of 10/29/2018.    ALLERGIES: No Known Allergies VACCINATION STATUS: Immunization History  Administered Date(s) Administered  . Influenza Split 08/24/2012  . Influenza, High Dose Seasonal PF 10/31/2017, 10/02/2018  . Influenza,inj,Quad PF,6+ Mos 10/09/2013, 09/05/2014, 11/23/2015, 08/16/2016  . Pneumococcal Conjugate-13 01/07/2014  . Pneumococcal Polysaccharide-23 11/25/2012    Diabetes  He presents for his follow-up diabetic visit. He has type 2 diabetes mellitus. Onset time: diagnosed approx at age 78. His disease course has been stable. There are no hypoglycemic associated symptoms. Pertinent negatives for hypoglycemia include no confusion, headaches, pallor or seizures. Pertinent negatives for diabetes include no chest pain, no fatigue, no polydipsia, no polyphagia, no polyuria and no weakness. Symptoms are improving. Diabetic complications include a CVA. Risk factors for coronary artery disease include diabetes mellitus, dyslipidemia, hypertension, male sex, obesity, sedentary lifestyle and tobacco exposure. Current diabetic  treatment includes insulin injections. He is following a generally unhealthy diet. When asked about meal planning, he reported none. He has had a previous visit with a dietitian. He never participates in exercise. His breakfast blood glucose range is generally 140-180 mg/dl. His bedtime blood glucose range is generally 140-180 mg/dl. His overall blood glucose range is 140-180 mg/dl. An ACE inhibitor/angiotensin II receptor blocker is being taken.  Hyperlipidemia  This is a chronic problem. The current episode started more than 1 year ago. The problem is uncontrolled. Recent lipid tests were reviewed and are variable. Exacerbating diseases include diabetes and obesity. Pertinent negatives include no chest pain, myalgias or shortness of breath. Risk factors for coronary artery disease include diabetes mellitus, dyslipidemia, hypertension, male sex and a sedentary lifestyle.  Hypertension  This is a chronic problem. The current episode started more than 1 year ago. The problem is controlled. Pertinent negatives include no chest pain, headaches, neck pain, palpitations or shortness of breath. Risk factors for coronary artery disease include dyslipidemia, diabetes mellitus, male gender, sedentary lifestyle and obesity. Past treatments include ACE inhibitors. Hypertensive end-organ damage includes CVA.     Review of Systems  Constitutional: Negative for chills, fatigue, fever and unexpected weight change.  HENT: Negative for dental problem, mouth sores and trouble swallowing.   Eyes: Negative for visual disturbance.  Respiratory: Negative for cough, choking, chest tightness, shortness of breath and wheezing.   Cardiovascular: Negative for chest pain, palpitations and leg swelling.  Gastrointestinal: Negative for abdominal distention, abdominal pain, constipation, diarrhea, nausea and vomiting.  Endocrine: Negative for polydipsia, polyphagia and polyuria.  Genitourinary: Negative for dysuria, flank pain,  hematuria and urgency.  Musculoskeletal: Negative for back pain, gait problem, joint swelling, myalgias and neck pain.  Skin: Negative for pallor, rash and wound.  Neurological: Negative for seizures, weakness, numbness and headaches.  Psychiatric/Behavioral: Negative for confusion, dysphoric mood, hallucinations and suicidal ideas.    Objective:    BP (!) 152/89   Pulse 71   Ht 6' (1.829 m)   BMI 36.21 kg/m   Wt Readings from Last 3 Encounters:  07/27/18 266 lb 15.6 oz (121.1 kg)  04/18/18 267 lb (121.1 kg)  04/13/18 267 lb (121.1 kg)    Physical Exam  Constitutional: He is oriented to person, place,  and time. He appears well-developed. He is cooperative.  HENT:  Head: Normocephalic and atraumatic.  Eyes: EOM are normal.  Neck: Normal range of motion. Neck supple. No tracheal deviation present. No thyromegaly present.  Cardiovascular: Normal rate. Exam reveals no gallop.  No murmur heard. Pulses:      Dorsalis pedis pulses are 1+ on the right side.       Posterior tibial pulses are 1+ on the right side.  Pulmonary/Chest: Effort normal. No respiratory distress. He has no wheezes.  Abdominal: He exhibits no distension.  Musculoskeletal: He exhibits edema.  He is wheelchair-bound due to his prior CVA.  He has significant dependent edema on bilateral lower extremities.  Neurological: He is alert and oriented to person, place, and time. A sensory deficit is present. No cranial nerve deficit. He exhibits normal muscle tone. Gait normal.  Pt is wheelchair bound due to past recurrent CVA  Skin: Skin is warm and dry. No rash noted. No cyanosis. Nails show no clubbing.  He has bilateral gynecomastia, palpation did not reveal any mass lesions.  Nipple discharge.  Psychiatric: He has a normal mood and affect. His speech is normal. Judgment normal. Cognition and memory are normal.    CMP  CMP Latest Ref Rng & Units 10/25/2018 07/27/2018 07/19/2018  Glucose 65 - 99 mg/dL 66 148(H) 89  BUN  7 - 25 mg/dL 15 19 20   Creatinine 0.70 - 1.18 mg/dL 1.34(H) 1.30(H) 1.35(H)  Sodium 135 - 146 mmol/L 141 140 139  Potassium 3.5 - 5.3 mmol/L 4.5 4.4 4.3  Chloride 98 - 110 mmol/L 105 104 106  CO2 20 - 32 mmol/L 25 26 24   Calcium 8.6 - 10.3 mg/dL 10.6(H) 10.8(H) 10.4(H)  Total Protein 6.1 - 8.1 g/dL 7.2 8.0 6.9  Total Bilirubin 0.2 - 1.2 mg/dL 0.3 0.7 0.2  Alkaline Phos 38 - 126 U/L - 79 -  AST 10 - 35 U/L 11 15 10   ALT 9 - 46 U/L 9 14 9     Diabetic Labs (most recent): Lab Results  Component Value Date   HGBA1C 6.3 (H) 10/25/2018   HGBA1C 6.8 (H) 07/19/2018   HGBA1C 7.3 (H) 04/12/2018   Lipid Panel     Component Value Date/Time   CHOL 151 11/01/2017 0828   TRIG 219 (H) 11/01/2017 0828   HDL 56 11/01/2017 0828   CHOLHDL 2.7 11/01/2017 0828   VLDL 66 (H) 05/04/2017 0931   LDLCALC 66 11/01/2017 0828     Assessment & Plan:   1. Type 2 diabetes mellitus with stage 2 chronic kidney disease and CVA -He presents with controlled diabetes with A1c of 6.3%.  His diabetes is complicated by recurrent CVA, stage 2-3 renal insufficiency, and he remains at extremely high risk for more acute and chronic complications of diabetes which include CAD, CVA, CKD, retinopathy, and neuropathy. These are all discussed in detail with the patient.  -In May 2019, he was diagnosed with non-small cell lung cancer , status post multiple rounds of radiation treatment completed on May 30, 2018.   -His wife reports rare and random morning hypoglycemia in 260s.   Glucose logs and insulin administration records pertaining to this visit,  to be scanned into patient's records.  Recent labs reviewed.   - I have re-counseled the patient on diet management and weight loss  by adopting a carbohydrate restricted / protein rich  Diet.  -  Suggestion is made for him to avoid simple carbohydrates  from his diet including Cakes,  Sweet Desserts / Pastries, Ice Cream, Soda (diet and regular), Sweet Tea, Candies, Chips,  Cookies, Store Bought Juices, Alcohol in Excess of  1-2 drinks a day, Artificial Sweeteners, and "Sugar-free" Products. This will help patient to have stable blood glucose profile and potentially avoid unintended weight gain.  - Patient is advised to stick to a routine mealtimes to eat 3 meals  a day and avoid unnecessary snacks ( to snack only to correct hypoglycemia).  - I have approached patient with the following individualized plan to manage diabetes and patient agrees.  -Did fairly well with simplified treatment program, with A1c of 6.3% and some reported mild hypoglycemia.   -He is advised to lower her Lantus to 50 units nightly associated with monitoring of blood glucose 2 times daily-before breakfast and at bedtime, continue to hold Humalog for now.    -Patient is encouraged to call clinic for blood glucose levels less than 70 or above 300 mg /dl.  - I will continue  Metformin 530m  by mouth twice a day with meals.   -Patient is not a candidate for SGLT2 inhibitors due to CKD, not a suitable candidate for incretin therapy.  - Patient specific target  for A1c; LDL, HDL, Triglycerides, and  Waist Circumference were discussed in detail.  2) BP/HTN: His blood pressure is not controlled to target.    He is advised to continue his current medications including lisinopril 20 mg p.o. daily.   Amlodipine 10 mg p.o. daily, clonidine 0.2 mg p.o. twice daily, and continue with PMD.  3) Lipids/HPL: Recent lipid panel showed controlled LDL at 66.  He is advised to continue simvastatin 20 mg p.o. nightly.    4)  Weight/Diet: He is limited on how much he can exercise and carbohydrates information provided.   5) hypothyroidism: -His thyroid function tests are consistent with appropriate replacement. - I advised him to continue   levothyroxine 88 g by mouth every morning.  - We discussed about correct intake of levothyroxine, at fasting, with water, separated by at least 30 minutes from breakfast,  and separated by more than 4 hours from calcium, iron, multivitamins, acid reflux medications (PPIs). -Patient is made aware of the fact that thyroid hormone replacement is needed for life, dose to be adjusted by periodic monitoring of thyroid function tests.  6) hypogonadism/gynecomastia -new diagnosis   -His wife reports that his gynecomastia is that of insidious onset progressing over several years. -Gynecomastia could be of multiple etiologies including unopposed effect of estrogen due to significantly low testosterone of 73.  His prolactin is normal at 8.4. -Patient is not a suitable candidate for testosterone replacement. -I will add prolactin measurement along with his next blood work. -Examination of his bilateral breasts did not reveal mass lesions, just generalized enlargement of his bilateral breast, no nipple discharge. -I instructed patient to continue to feel his breasts on a regular basis, if he notices mass lesion or continues to enlarge, he may need breast ultrasound for better evaluation.  7) Chronic Care/Health Maintenance:  -Patient is on ACEI/ARB and Statin medications and encouraged to continue to follow up with Ophthalmology, Podiatrist at least yearly or according to recommendations, and advised to  stay away from smoking. I have recommended yearly flu vaccine and pneumonia vaccination at least every 5 years; moderate intensity exercise for up to 150 minutes weekly; and  sleep for at least 7 hours a day. Leg elevation when sitting and lying in bed is advised to prevent dependent bilateral lower  extremity edema.   8) hypercalcemia  - continues to have mild hypercalcemia, currently at 10.6.  Could be related to his non-small cell lung cancer.  His PTH is mildly elevated at 121, not significant in the face of CKD.  He will not require suppressive therapy for this mildly elevated PTH.    -He is also at risk of immobilization hypercalcemia due to his prolonged immobilization  secondary to his stroke.  If his calcium continues to increase to above 11 mg/dL, would be considered for 1 dose of zoledronic acid infusion after next visit.   - I advised patient to maintain close follow up with Dr. Buelah Manis for primary care needs.   - Time spent with the patient: 25 min, of which >50% was spent in reviewing his blood glucose logs , discussing his hypo- and hyper-glycemic episodes, reviewing his current and  previous labs and insulin doses and developing a plan to avoid hypo- and hyper-glycemia. Please refer to Patient Instructions for Blood Glucose Monitoring and Insulin/Medications Dosing Guide"  in media tab for additional information. [name] participated in the discussions, expressed understanding, and voiced agreement with the above plans.  All questions were answered to his satisfaction. he is encouraged to contact clinic should he have any questions or concerns prior to his return visit.   Follow up plan: Return in about 4 months (around 02/27/2019) for Meter, and Logs.  Glade Lloyd, MD Phone: [phone]  Fax: [phone]  This note was partially dictated with voice recognition software. Similar sounding words can be transcribed inadequately or may not  be corrected upon review.  10/29/2018, 6:27 PM

## 2018-10-29 NOTE — Patient Instructions (Signed)

## 2018-11-01 LAB — HEMOGLOBIN A1C
Hgb A1c MFr Bld: 6.3 % of total Hgb — ABNORMAL HIGH (ref <5.7)
MEAN PLASMA GLUCOSE: 134 (calc)
eAG (mmol/L): 7.4 (calc)

## 2018-11-01 LAB — TSH: TSH: 2.6 m[IU]/L (ref 0.40–4.50)

## 2018-11-01 LAB — COMPLETE METABOLIC PANEL WITH GFR
AG RATIO: 1.3 (calc) (ref 1.0–2.5)
ALT: 9 U/L (ref 9–46)
AST: 11 U/L (ref 10–35)
Albumin: 4 g/dL (ref 3.6–5.1)
Alkaline phosphatase (APISO): 83 U/L (ref 40–115)
BUN/Creatinine Ratio: 11 (calc) (ref 6–22)
BUN: 15 mg/dL (ref 7–25)
CALCIUM: 10.6 mg/dL — AB (ref 8.6–10.3)
CO2: 25 mmol/L (ref 20–32)
Chloride: 105 mmol/L (ref 98–110)
Creat: 1.34 mg/dL — ABNORMAL HIGH (ref 0.70–1.18)
GFR, EST NON AFRICAN AMERICAN: 53 mL/min/{1.73_m2} — AB (ref >=60)
GFR, Est African American: 61 mL/min/{1.73_m2} (ref >=60)
GLUCOSE: 66 mg/dL (ref 65–99)
Globulin: 3.2 g/dL (calc) (ref 1.9–3.7)
POTASSIUM: 4.5 mmol/L (ref 3.5–5.3)
Sodium: 141 mmol/L (ref 135–146)
Total Bilirubin: 0.3 mg/dL (ref 0.2–1.2)
Total Protein: 7.2 g/dL (ref 6.1–8.1)

## 2018-11-01 LAB — T4, FREE: FREE T4: 1.3 ng/dL (ref 0.8–1.8)

## 2018-11-01 LAB — PROLACTIN: Prolactin: 8.4 ng/mL (ref 2.0–18.0)

## 2018-11-01 LAB — PTH-RELATED PEPTIDE: PTH-RELATED PROTEIN (PTH-RP): 14 pg/mL (ref 14–27)

## 2018-11-01 LAB — PTH, INTACT AND CALCIUM
CALCIUM: 10.6 mg/dL — AB (ref 8.6–10.3)
PTH: 121 pg/mL — ABNORMAL HIGH (ref 14–64)

## 2018-11-05 ENCOUNTER — Ambulatory Visit (INDEPENDENT_AMBULATORY_CARE_PROVIDER_SITE_OTHER): Payer: Medicare Other | Admitting: Family Medicine

## 2018-11-05 ENCOUNTER — Other Ambulatory Visit: Payer: Self-pay

## 2018-11-05 ENCOUNTER — Encounter: Payer: Self-pay | Admitting: Family Medicine

## 2018-11-05 VITALS — BP 138/74 | HR 78 | Temp 98.1°F | Resp 16

## 2018-11-05 DIAGNOSIS — R109 Unspecified abdominal pain: Secondary | ICD-10-CM | POA: Diagnosis not present

## 2018-11-05 DIAGNOSIS — I10 Essential (primary) hypertension: Secondary | ICD-10-CM | POA: Diagnosis not present

## 2018-11-05 DIAGNOSIS — N183 Chronic kidney disease, stage 3 unspecified: Secondary | ICD-10-CM

## 2018-11-05 DIAGNOSIS — Z8673 Personal history of transient ischemic attack (TIA), and cerebral infarction without residual deficits: Secondary | ICD-10-CM

## 2018-11-05 DIAGNOSIS — L729 Follicular cyst of the skin and subcutaneous tissue, unspecified: Secondary | ICD-10-CM

## 2018-11-05 DIAGNOSIS — Z1159 Encounter for screening for other viral diseases: Secondary | ICD-10-CM

## 2018-11-05 DIAGNOSIS — L089 Local infection of the skin and subcutaneous tissue, unspecified: Secondary | ICD-10-CM

## 2018-11-05 DIAGNOSIS — E782 Mixed hyperlipidemia: Secondary | ICD-10-CM

## 2018-11-05 DIAGNOSIS — E1143 Type 2 diabetes mellitus with diabetic autonomic (poly)neuropathy: Secondary | ICD-10-CM

## 2018-11-05 DIAGNOSIS — R600 Localized edema: Secondary | ICD-10-CM

## 2018-11-05 DIAGNOSIS — K5909 Other constipation: Secondary | ICD-10-CM

## 2018-11-05 DIAGNOSIS — G8929 Other chronic pain: Secondary | ICD-10-CM

## 2018-11-05 MED ORDER — DOXYCYCLINE HYCLATE 100 MG PO TABS: 100.0000 mg | ORAL_TABLET | Freq: Two times a day (BID) | ORAL | 0 refills | Status: DC

## 2018-11-05 MED ORDER — OXYCODONE-ACETAMINOPHEN 7.5-325 MG PO TABS: 1.0000 | ORAL_TABLET | ORAL | 0 refills | Status: DC | PRN

## 2018-11-05 NOTE — Progress Notes (Signed)
   Subjective:    Patient ID: [name], male    DOB: Aug 04, 1946, 72 y.o.   MRN: [id number]  Patient presents for Follow-up (is fasting); Abscesses to Back; and Abdominal Pain Here to follow-up chronic medical problems.   He has history of impetigo bullosa as well as recurrent infected sebaceous cyst on his back and abscesses.  He is wheelchair-bound  Has sore cyst on his back, no drainage, no fever  Chronic abdominal pain he is on oxycodone.  His wife manages his medications.  He also has gabapentin.  Is on amitiza  for bowels, however PET Scan showed severe constipation   At end of visit, he has not been taking amitiza daily   Diabetes mellitus managed by endocrinology. On Statin drug   Hypertension-he is currently on lisinopril 20 mg once a day also on Lasix for fluid control  Due for Hep C screening   Recent labs from endocrine reviewed, CKD, Cr  1.36 , A1C 6.3%   HE IS CURRENTLY being followed by Oncology for lung cancer   Review Of Systems:  GEN- denies fatigue, fever, weight loss,weakness, recent illness HEENT- denies eye drainage, change in vision, nasal discharge, CVS- denies chest pain, palpitations RESP- denies SOB, cough, wheeze ABD- denies N/V, change in stools, +abd pain GU- denies dysuria, hematuria, dribbling, incontinence MSK- denies joint pain, muscle aches, injury Neuro- denies headache, dizziness, syncope, seizure activity       Objective:    BP 138/74   Pulse 78   Temp 98.1 F (36.7 C) (Oral)   Resp 16   SpO2 99%  GEN- NAD, alert and oriented x3, sitting in wheelchair, right hemiplegia HEENT- PERRL, EOMI, non injected sclera, pink conjunctiva, MMM, oropharynx clear Neck- Supple, no thryomegaly, no LAD, CVS- RRR, no murmur RESP-CTAB EXT- chronic non pitting edema bilat  R>l  Skin- cyst right upper back, palpated pus and cystic material expressed, no erythema, mild TTP Pulses- Radial 2+          Assessment & Plan:      Problem List  Items Addressed This Visit      Unprioritized   Chronic abdominal pain    Chronic constipation as well Will take amitiza daily to move bowels Discussed he is on chronic naroctics Neg CT/PET otherwise recently      Relevant Medications   oxyCODONE-acetaminophen (PERCOCET) 7.5-325 MG tablet   Chronic constipation   CKD (chronic kidney disease), stage III (HCC) - Primary    Recheck renal function      Diabetic neuropathy (HCC)   Essential hypertension    Well controlled no changes      History of CVA (cerebrovascular accident)   Leg edema    Increase lasix to 56m BID for 1 week, then return to baseline discusse ddiet, elevating feet- which he often declines       Mixed hyperlipidemia   Relevant Orders   Lipid panel    Other Visit Diagnoses    Need for hepatitis C screening test       Relevant Orders   Hepatitis C antibody   Infected cyst of skin       expressed at bedside, given doxycycline x 7 days, chronic recurrent problem has seen derm/ID      Note: This dictation was prepared with Dragon dictation along with smaller phrase technology. Any transcriptional errors that result from this process are unintentional.

## 2018-11-05 NOTE — Assessment & Plan Note (Signed)
Chronic constipation as well Will take amitiza daily to move bowels Discussed he is on chronic naroctics Neg CT/PET otherwise recently

## 2018-11-05 NOTE — Assessment & Plan Note (Signed)
Recheck renal function

## 2018-11-05 NOTE — Patient Instructions (Addendum)
Take the lasix 80m twice a day for a week for fluid  Pain medication refilled  Take the doxycycline twice a day  Take amitiza every day  We will call with lab results F/U 4 months

## 2018-11-05 NOTE — Assessment & Plan Note (Signed)
Increase lasix to 42m BID for 1 week, then return to baseline discusse ddiet, elevating feet- which he often declines

## 2018-11-05 NOTE — Assessment & Plan Note (Signed)
Well controlled no changes 

## 2018-11-19 ENCOUNTER — Other Ambulatory Visit: Payer: Self-pay | Admitting: Family Medicine

## 2018-11-25 ENCOUNTER — Other Ambulatory Visit: Payer: Self-pay | Admitting: Family Medicine

## 2018-11-26 IMAGING — CT CT CHEST W/O CM
1 of 2 series · 14 of 30 positions shown, 18 images · non-contrast
Comparison: PET 03/21/2018

ADDENDUM:
When compared with CT abdomen pelvis 05/23/2016, low-attenuation
lesions in the liver are grossly stable.
CLINICAL DATA: Pulmonary nodule.

EXAM:
CT CHEST WITHOUT CONTRAST
TECHNIQUE: Multidetector CT imaging of the chest was performed following the
standard protocol without IV contrast.

[Series 2: chest w/(date) · axial · 0.74mm/px · z∈[+934,+1248]mm · 14 of 185 slices shown, 18 images]
[im 14/185  mediastinal]
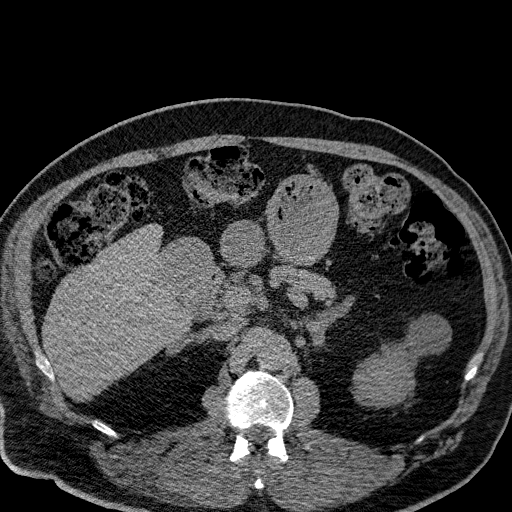
[im 14/185  lung]
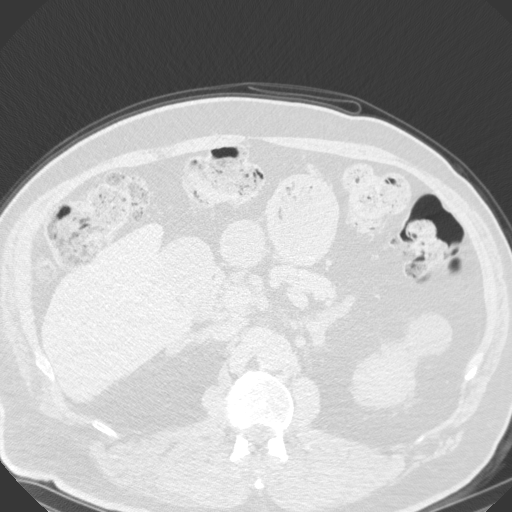
[im 27/185  lung]
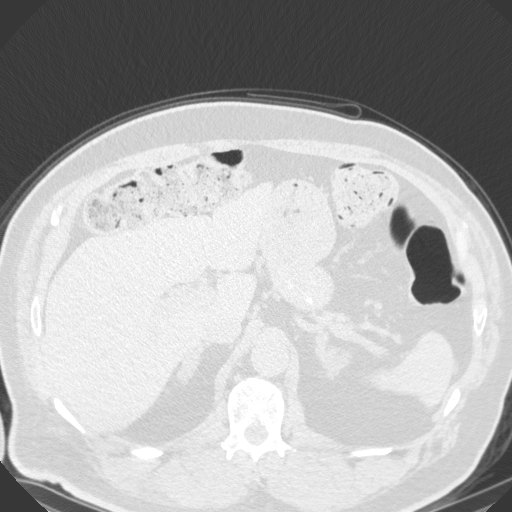
[im 40/185  lung]
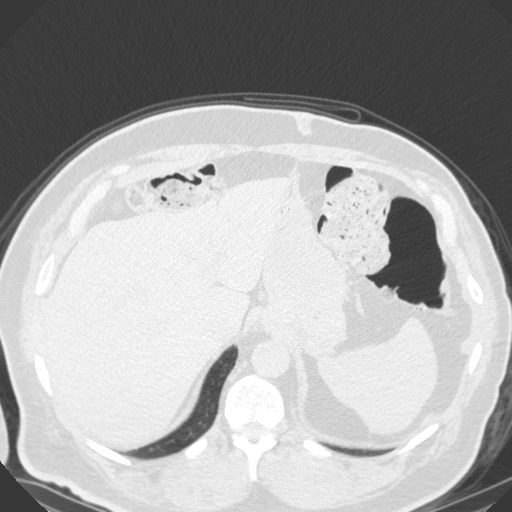
[im 53/185  lung]
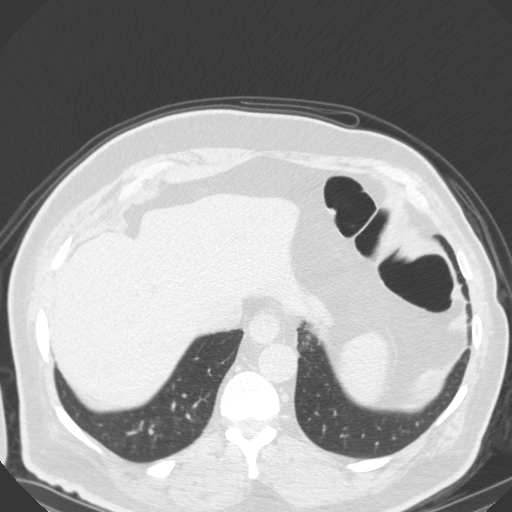
[im 66/185  mediastinal]
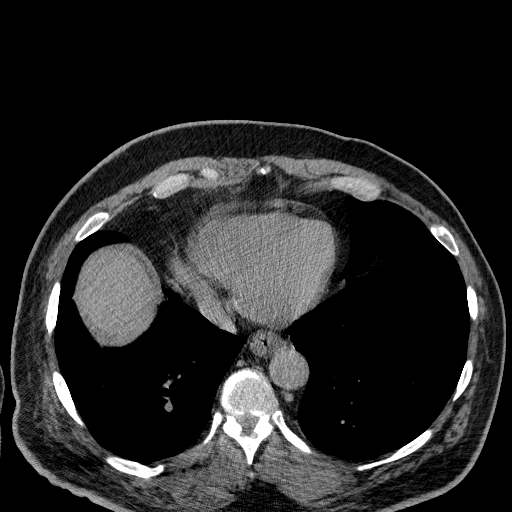
[im 66/185  lung]
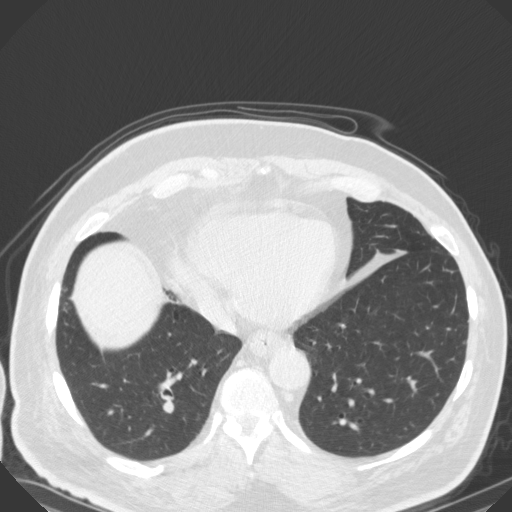
[im 79/185  lung]
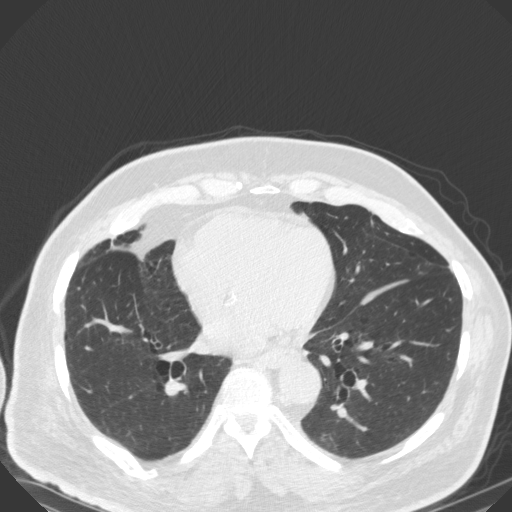
[im 88/185  lung]
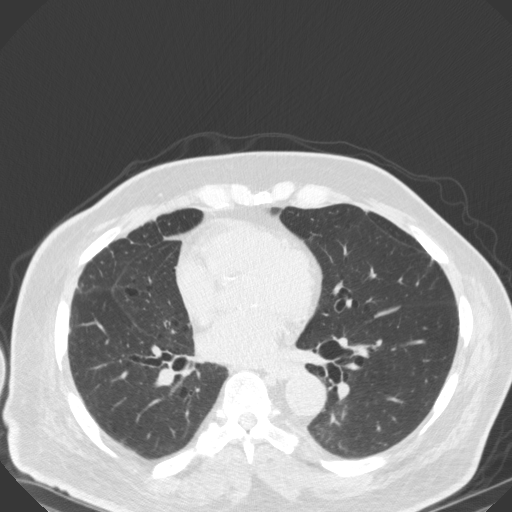
[im 93/185  lung]
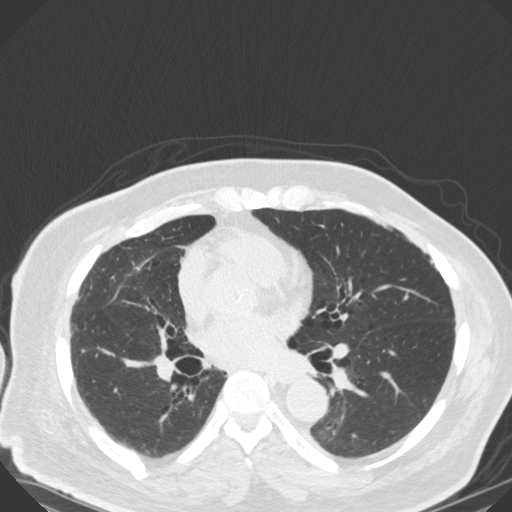
[im 106/185  mediastinal]
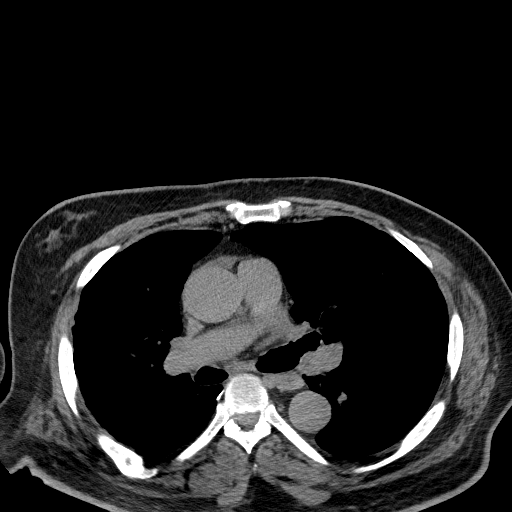
[im 106/185  lung]
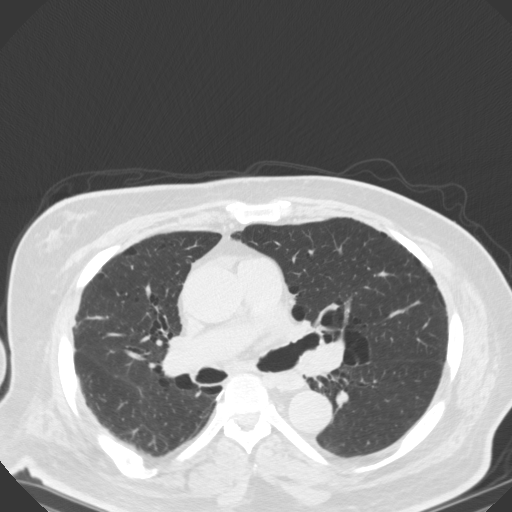
[im 119/185  lung]
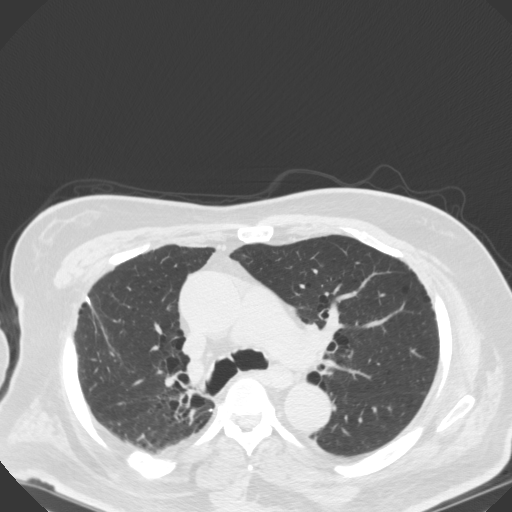
[im 132/185  lung]
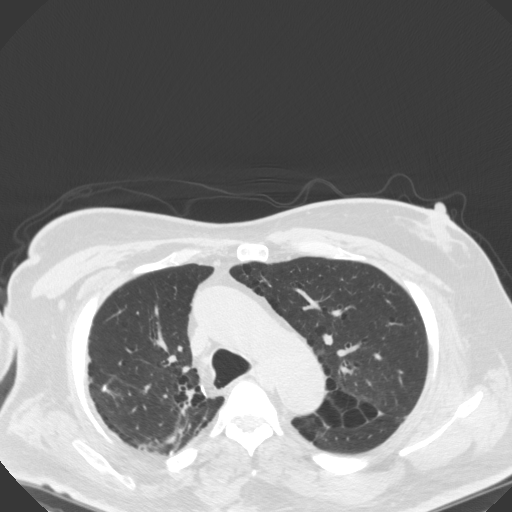
[im 145/185  lung]
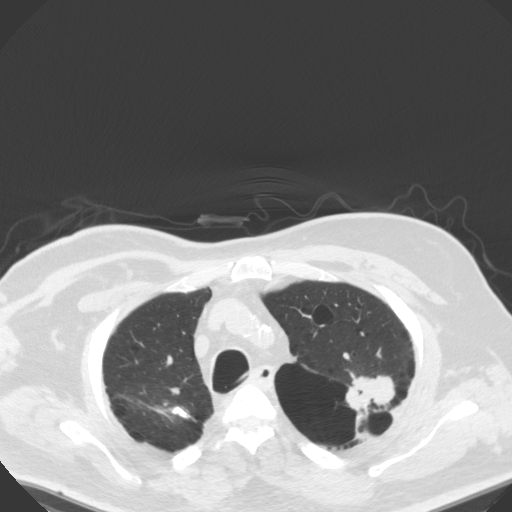
[im 158/185  mediastinal]
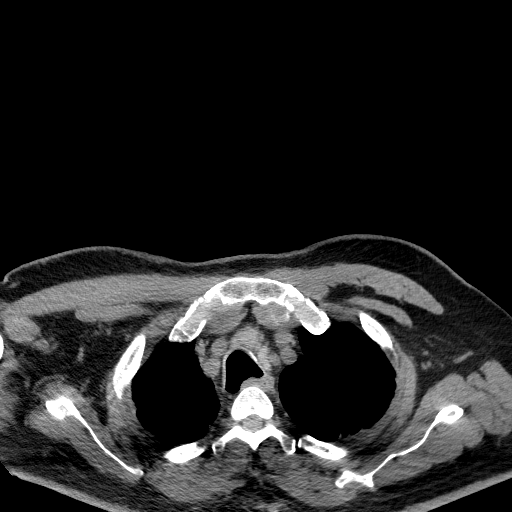
[im 158/185  lung]
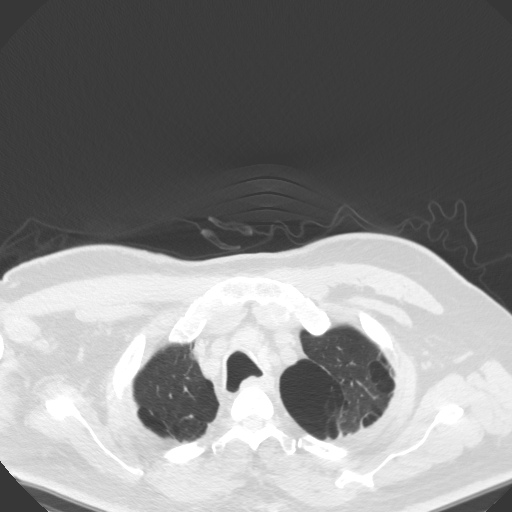
[im 171/185  lung]
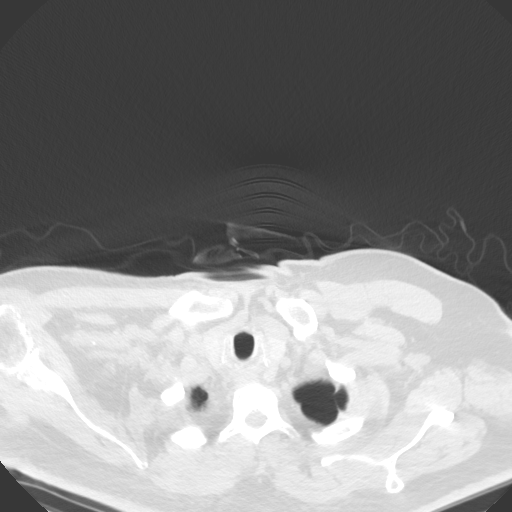

[14 of 30 positions shown; findings below may reference images not displayed]

FINDINGS: Cardiovascular: Atherosclerotic calcification of the arterial
vasculature, including coronary arteries and aortic valve. Ascending
aorta measures 4.2 cm. Heart size normal. No pericardial effusion.

Mediastinum/Nodes: No pathologically enlarged mediastinal or
axillary lymph nodes. Hilar regions are difficult to evaluate
without IV contrast. Esophagus is grossly unremarkable.

Lungs/Pleura: Bullous emphysema. Spiculated mass in the left upper
lobe measures 2.7 x 3.7 cm, as on 03/21/2018. Postoperative changes
in the right upper lobe. Mucoid impaction in the right lower lobe.
No pleural fluid. Tracheomegaly. Airway is otherwise unremarkable.

Upper Abdomen: Subcentimeter low-attenuation lesions in the liver
are too small to characterize. Cholecystectomy. Right adrenal gland
is unremarkable. 11 mm nodule in the lateral limb left adrenal gland
measures 12.3 Hounsfield units. Visualized portion of the right
kidney is unremarkable. 3.8 cm low-attenuation lesion in the
interpolar left kidney is incompletely imaged. Smaller hyperdense
and hypodense lesions in the left kidney are too small to
characterize. Visualized portions of the spleen, pancreas, stomach
and bowel are grossly unremarkable.

Musculoskeletal: Degenerative changes in the spine. No worrisome
lytic or sclerotic lesions. Right thoracotomy changes. Focal skin
thickening along the lateral aspect of the right breast, as on
03/21/2018.
IMPRESSION: 1. Left upper lobe mass is most consistent with primary bronchogenic
carcinoma.
2. Left adrenal nodule has imaging characteristics of an adenoma but
is borderline too small for definitive characterization. Lesion was
shown to be hypermetabolic on 03/21/2018.
3. Aortic atherosclerosis (OCKCY-170.0). Coronary artery
calcification.
4. Aortic aneurysm NOS (OCKCY-OF0.1). Recommend annual imaging
followup by CTA or MRA. This recommendation follows 9959
ACCF/AHA/AATS/ACR/ASA/SCA/REINHARDT/NOMASIBULELE/KANCHAN/CISMAYANA Guidelines for the
Diagnosis and Management of Patients with Thoracic Aortic Disease.
Circulation. 9959; 121: [id number].
5.  Emphysema (OCKCY-QNU.A).
6. Focal skin thickening along the lateral aspect of the right
breast, shown to be hypermetabolic on 03/21/2018. Please correlate
clinically.

## 2018-11-26 NOTE — Telephone Encounter (Signed)
Ok to refill Zanaflex?

## 2018-12-01 ENCOUNTER — Emergency Department (HOSPITAL_COMMUNITY)
Admission: EM | Admit: 2018-12-01 | Discharge: 2018-12-01 | Disposition: A | Discharge status: Home / Self Care | Payer: Medicare Other | Attending: Emergency Medicine | Admitting: Emergency Medicine

## 2018-12-01 ENCOUNTER — Emergency Department (HOSPITAL_COMMUNITY): Payer: Medicare Other

## 2018-12-01 ENCOUNTER — Encounter (HOSPITAL_COMMUNITY): Payer: Self-pay | Admitting: Emergency Medicine

## 2018-12-01 ENCOUNTER — Other Ambulatory Visit: Payer: Self-pay

## 2018-12-01 DIAGNOSIS — W19XXXA Unspecified fall, initial encounter: Secondary | ICD-10-CM | POA: Diagnosis not present

## 2018-12-01 DIAGNOSIS — R0902 Hypoxemia: Secondary | ICD-10-CM | POA: Diagnosis not present

## 2018-12-01 DIAGNOSIS — J45909 Unspecified asthma, uncomplicated: Secondary | ICD-10-CM | POA: Insufficient documentation

## 2018-12-01 DIAGNOSIS — W010XXA Fall on same level from slipping, tripping and stumbling without subsequent striking against object, initial encounter: Secondary | ICD-10-CM | POA: Diagnosis not present

## 2018-12-01 DIAGNOSIS — Y9301 Activity, walking, marching and hiking: Secondary | ICD-10-CM | POA: Insufficient documentation

## 2018-12-01 DIAGNOSIS — Y999 Unspecified external cause status: Secondary | ICD-10-CM | POA: Diagnosis not present

## 2018-12-01 DIAGNOSIS — Y929 Unspecified place or not applicable: Secondary | ICD-10-CM | POA: Insufficient documentation

## 2018-12-01 DIAGNOSIS — R531 Weakness: Secondary | ICD-10-CM | POA: Diagnosis present

## 2018-12-01 DIAGNOSIS — Z87891 Personal history of nicotine dependence: Secondary | ICD-10-CM | POA: Insufficient documentation

## 2018-12-01 DIAGNOSIS — Z79899 Other long term (current) drug therapy: Secondary | ICD-10-CM | POA: Insufficient documentation

## 2018-12-01 DIAGNOSIS — R109 Unspecified abdominal pain: Secondary | ICD-10-CM | POA: Diagnosis not present

## 2018-12-01 DIAGNOSIS — S3991XA Unspecified injury of abdomen, initial encounter: Secondary | ICD-10-CM | POA: Diagnosis not present

## 2018-12-01 DIAGNOSIS — S32020A Wedge compression fracture of second lumbar vertebra, initial encounter for closed fracture: Secondary | ICD-10-CM | POA: Insufficient documentation

## 2018-12-01 DIAGNOSIS — R1084 Generalized abdominal pain: Secondary | ICD-10-CM | POA: Insufficient documentation

## 2018-12-01 DIAGNOSIS — S199XXA Unspecified injury of neck, initial encounter: Secondary | ICD-10-CM | POA: Diagnosis not present

## 2018-12-01 DIAGNOSIS — E119 Type 2 diabetes mellitus without complications: Secondary | ICD-10-CM | POA: Insufficient documentation

## 2018-12-01 DIAGNOSIS — M5489 Other dorsalgia: Secondary | ICD-10-CM | POA: Diagnosis not present

## 2018-12-01 DIAGNOSIS — Z794 Long term (current) use of insulin: Secondary | ICD-10-CM | POA: Insufficient documentation

## 2018-12-01 DIAGNOSIS — S0990XA Unspecified injury of head, initial encounter: Secondary | ICD-10-CM | POA: Diagnosis not present

## 2018-12-01 DIAGNOSIS — I10 Essential (primary) hypertension: Secondary | ICD-10-CM | POA: Diagnosis not present

## 2018-12-01 DIAGNOSIS — S32028A Other fracture of second lumbar vertebra, initial encounter for closed fracture: Secondary | ICD-10-CM | POA: Diagnosis not present

## 2018-12-01 LAB — URINALYSIS, ROUTINE W REFLEX MICROSCOPIC
Bacteria, UA: NONE SEEN
Bilirubin Urine: NEGATIVE
Glucose, UA: NEGATIVE mg/dL
Hgb urine dipstick: NEGATIVE
Ketones, ur: NEGATIVE mg/dL
Leukocytes, UA: NEGATIVE
Nitrite: NEGATIVE
PROTEIN: 100 mg/dL — AB
Specific Gravity, Urine: 1.012 (ref 1.005–1.030)
pH: 6 (ref 5.0–8.0)

## 2018-12-01 LAB — CBC WITH DIFFERENTIAL/PLATELET
Abs Immature Granulocytes: 0.05 10*3/uL (ref 0.00–0.07)
Basophils Absolute: 0.1 10*3/uL (ref 0.0–0.1)
Basophils Relative: 1 %
Eosinophils Absolute: 0.3 10*3/uL (ref 0.0–0.5)
Eosinophils Relative: 3 %
HCT: 44.9 % (ref 39.0–52.0)
Hemoglobin: 13.4 g/dL (ref 13.0–17.0)
IMMATURE GRANULOCYTES: 1 %
LYMPHS ABS: 0.8 10*3/uL (ref 0.7–4.0)
Lymphocytes Relative: 8 %
MCH: 23 pg — ABNORMAL LOW (ref 26.0–34.0)
MCHC: 29.8 g/dL — ABNORMAL LOW (ref 30.0–36.0)
MCV: 77.1 fL — ABNORMAL LOW (ref 80.0–100.0)
Monocytes Absolute: 0.5 10*3/uL (ref 0.1–1.0)
Monocytes Relative: 5 %
NEUTROS PCT: 82 %
NRBC: 0 % (ref 0.0–0.2)
Neutro Abs: 8.4 10*3/uL — ABNORMAL HIGH (ref 1.7–7.7)
Platelets: 214 10*3/uL (ref 150–400)
RBC: 5.82 MIL/uL — ABNORMAL HIGH (ref 4.22–5.81)
RDW: 18.2 % — ABNORMAL HIGH (ref 11.5–15.5)
WBC: 10.2 10*3/uL (ref 4.0–10.5)

## 2018-12-01 LAB — COMPREHENSIVE METABOLIC PANEL
ALT: 15 U/L (ref 0–44)
AST: 18 U/L (ref 15–41)
Albumin: 3.8 g/dL (ref 3.5–5.0)
Alkaline Phosphatase: 72 U/L (ref 38–126)
Anion gap: 8 (ref 5–15)
BILIRUBIN TOTAL: 0.2 mg/dL — AB (ref 0.3–1.2)
BUN: 20 mg/dL (ref 8–23)
CHLORIDE: 108 mmol/L (ref 98–111)
CO2: 24 mmol/L (ref 22–32)
Calcium: 10.5 mg/dL — ABNORMAL HIGH (ref 8.9–10.3)
Creatinine, Ser: 1.31 mg/dL — ABNORMAL HIGH (ref 0.61–1.24)
GFR calc Af Amer: >60 mL/min (ref >60)
GFR calc non Af Amer: 54 mL/min — ABNORMAL LOW (ref >60)
Glucose, Bld: 106 mg/dL — ABNORMAL HIGH (ref 70–99)
POTASSIUM: 4.2 mmol/L (ref 3.5–5.1)
Sodium: 140 mmol/L (ref 135–145)
TOTAL PROTEIN: 7.5 g/dL (ref 6.5–8.1)

## 2018-12-01 LAB — CBG MONITORING, ED: GLUCOSE-CAPILLARY: 91 mg/dL (ref 70–99)

## 2018-12-01 MED ORDER — ACETAMINOPHEN 325 MG PO TABS: 650.0000 mg | ORAL_TABLET | Freq: Once | ORAL | Status: DC

## 2018-12-01 MED ORDER — ONDANSETRON 4 MG PO TBDP
4.0000 mg | ORAL_TABLET | Freq: Once | ORAL | Status: AC
  Administered 2018-12-01: 4 mg via ORAL
  Filled 2018-12-01: qty 1

## 2018-12-01 MED ORDER — OXYCODONE-ACETAMINOPHEN 5-325 MG PO TABS
2.0000 | ORAL_TABLET | Freq: Once | ORAL | Status: AC
  Administered 2018-12-01: 2 via ORAL
  Filled 2018-12-01: qty 2

## 2018-12-01 MED ORDER — IOPAMIDOL (ISOVUE-300) INJECTION 61%
100.0000 mL | Freq: Once | INTRAVENOUS | Status: AC | PRN
  Administered 2018-12-01: 100 mL via INTRAVENOUS

## 2018-12-01 MED ORDER — OXYCODONE HCL 5 MG PO TABS: 5.0000 mg | ORAL_TABLET | ORAL | 0 refills | Status: DC | PRN

## 2018-12-01 NOTE — Discharge Instructions (Signed)
As discussed, you may be a good candidate for vertebroplasty to repair your fractured vertebrae and to give you much quicker relief of pain from this injury.  Dr. Pasty Arch office should be contacting you for an office visit to discuss this possibility.  I have printed information about this procedure for you to read.  You have also been given his information above.  Continue taking your home pain medication.  I have prescribed additional oxycodone that you may take in between your regular doses if needed for additional pain relief.  Use caution as this medication as it can make you drowsy.  Your CT imaging of your head, cervical spine and abdomen are negative for any other acute injury.

## 2018-12-01 NOTE — ED Notes (Signed)
Patient transported to CT 

## 2018-12-01 NOTE — ED Triage Notes (Addendum)
Patient brought in via EMS from home. Airway patent. Alert and to person,event, and place. Patient fell this morning while trying to ambulate to bathroom. Denies hitting head or LOC. Per patient family heard fall and helped him immediately back into bed. Denies taking any type of anticoagulant. Patient c/o lower back pain only. Patient had stroke in past with inability to use right arm at all.

## 2018-12-01 NOTE — ED Provider Notes (Signed)
[facility] EMERGENCY DEPARTMENT Provider Note   CSN: [id number] Arrival date & time: 12/01/18  0830     History   Chief Complaint Chief Complaint  Patient presents with  . Fall    HPI [name] is a 72 y.o. male with a history as outlined below, most significant for diabetes, chronic abdominal pain secondary to gastroparesis, renal insufficiency and prior CVA with right-sided chronic weakness presenting with a fall which occurred just prior to arrival.  He is mostly sedentary, being wheelchair bound since his stroke but can transfer and walk short distances using a cane.  He was walking to the bathroom this morning when he fell and sustained pain in his lower back with radiation of pain around to his left lower abdomen.  He denies hitting his head, he denies syncope or LOC, but is unable to explain the reason for the fall.  Wife was home but did not witness the fall.  He denies feeling dizzy or lightheaded prior to the event.  He endorses posterior neck pain, denies headache, chest pain or shortness of breath, also denies hip pain or extremity pain including knees ankles shoulders or elbows.  The history is provided by the patient and the spouse.    Past Medical History:  Diagnosis Date  . Arthritis   . Asthma    "hx of"  . Barrett's esophagus    EGD 03/23/2011 & EGD 2/09 bx proven  . BPH (benign prostatic hyperplasia)   . Cancer of parotid gland ([place]) 11/23/12   Adenocarcinoma  . Chronic abdominal pain   . Chronic constipation   . Colon polyp 03/23/2011   tubular adenoma, Dr. Gala Romney  . 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 ([place]) 07/15/2011  . CVA (cerebral infarction) 1998   right sided deficit  . Delayed gastric emptying 2018  . Diverticulosis    TCS 03/23/11 pancolonic diverticula &TCS 5/08, pancolonic diverticula  . DM (diabetes mellitus) ([place])   . Edema of lower extremity 12/21/12   bilateral   . Esotropia of left eye   . GERD (gastroesophageal reflux  disease)   . Glaucoma (increased eye pressure)   . Gout   . Gout   . Hemorrhagic colitis 06/06/2012.  [name] Hemorrhoids, internal 03/23/2011   tcs by Dr. Gala Romney  . Hepatitis    esosiniphilic, tx with prednisone  . Hiatal hernia   . History of radiation therapy 05/21/18- 05/30/18   Left Lung/ 54 Gy delivered in 3 fractions of 18 Gy. SBRT  . HTN (hypertension)   . Hx of radiation therapy 1974   right base of skull area-lymphoma  . Hyperlipidemia   . Lower facial weakness    Right  . Lymphoma ([place]) 1974   [facility] at [facility], right base of skull area  . Neuropathy   . Peripheral edema    R>L legs  . Rash    chronic, recurrent, R>L legs  . Renal insufficiency   . Steatohepatitis    liver biopsy 2009  . Stroke [facility]) 1998   right hemiparesis/plegia    Patient Active Problem List   Diagnosis Date Noted  . Hypogonadism, male 07/26/2018  . Gynecomastia, male 06/27/2018  . Malignant neoplasm of upper lobe of left lung ([place]) 04/06/2018  . History of parotid cancer 02/07/2018  . RUQ pain 11/09/2016  . Colon adenomas 11/09/2016  . Primary hypothyroidism 09/09/2015  . Chronic constipation 05/22/2015  . Candidiasis, intertrigo 09/05/2014  . Arthritis, shoulder region 12/10/2013  . Impetigo  bullosa 10/23/2013  . Intertrigo 10/12/2013  . Chronic left shoulder pain 09/25/2013  . Muscle weakness (generalized) 09/25/2013  . Hypercalcemia 08/08/2013  . CKD (chronic kidney disease), stage III ([place]) 08/07/2013  . Adhesive capsulitis of left shoulder 05/14/2013  . Steatohepatitis   . Diverticulosis   . GERD (gastroesophageal reflux disease)   . History of CVA (cerebrovascular accident)   . Hiatal hernia   . Mixed hyperlipidemia   . Glaucoma (increased eye pressure)   . Esotropia of left eye   . BPH (benign prostatic hyperplasia)   . Hx of radiation therapy   . Parotid gland adenocarcinoma ([place]) 11/23/2012  . Epidermal inclusion cyst 10/05/2012  . Neck pain 08/26/2012  . Chronic abdominal pain  08/26/2012  . Leg edema 08/24/2012  . Hemiplegia of dominant side as late effect following cerebrovascular disease ([place]) 06/06/2012  . Diarrhea 04/17/2012  . Obesity 04/17/2012  . Diabetic neuropathy ([place]) 02/13/2012  . Recurrent boils 01/12/2012  . 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 ([place]) 07/15/2011  . Hemorrhoids, internal 03/23/2011  . Hepatitis 03/02/2011  . EOSINOPHILIA 12/23/2009  . Type 2 diabetes mellitus with stage 2 chronic kidney disease, with long-term current use of insulin ([place]) 01/05/2009  . Gout, unspecified 01/05/2009  . Essential hypertension 01/05/2009  . BARRETTS ESOPHAGUS 01/05/2009  . DIVERTICULOSIS OF COLON 01/05/2009  . History of lymphoma 01/05/2009    Past Surgical History:  Procedure Laterality Date  . BIOPSY  12/01/2016   Procedure: BIOPSY;  Surgeon: Daneil Dolin, MD;  Location: [facility] ENDO SUITE;  Service: Endoscopy;;  duodenum, gastric, esophagus  . CHOLECYSTECTOMY    . COLONOSCOPY  03/23/11   Dr. Gala Romney  pancolonic diverticula, hemorrhoids, tubular adenoma.. next tcs 03/2016  . COLONOSCOPY WITH PROPOFOL N/A 12/01/2016   inadequate bowel prep precluded exam  . ESOPHAGOGASTRODUODENOSCOPY  02/05/08   goblet cell metaplasia/negative for H.pylori  . ESOPHAGOGASTRODUODENOSCOPY  03/23/11   Dr. Gala Romney, barretts, hiatal hernia  . ESOPHAGOGASTRODUODENOSCOPY (EGD) WITH PROPOFOL N/A 12/01/2016   Dr. Gala Romney: Large amount of retained gastric contents precluded completion of the stomach. Mucosal changes were found in the stomach. Erosions and somewhat scalloped appearing mucosa present, reactive gastritis/no H pylori. Barrett's esophagus noted, no dysplasia on biopsy. Duodenal biopsies taken as well, benign, no evidence of eosinophilia.  [name] MASS BIOPSY  11/01/2012   Procedure: NECK MASS BIOPSY;  Surgeon: Ascencion Dike, MD;  Location: [facility] ORS;  Service: ENT;  Laterality: Right;  Excisional Bx Right Neck Mass; attempted external jugular cutdown of left side  .  PAROTIDECTOMY  11/24/2012   Procedure: PAROTIDECTOMY;  Surgeon: Ascencion Dike, MD;  Location: [place];  Service: ENT;  Laterality: N/A;  Total parotidectomy  . PLEURECTOMY    . right lymph node removal Right    behind right ear  . Right video-assisted thoracic surgery, pleurectomy, and pleurodesis  2011  . VIDEO BRONCHOSCOPY WITH ENDOBRONCHIAL NAVIGATION N/A 04/26/2018   Procedure: VIDEO BRONCHOSCOPY WITH ENDOBRONCHIAL NAVIGATION;  Surgeon: Melrose Nakayama, MD;  Location: [place];  Service: Thoracic;  Laterality: N/A;        Home Medications    Prior to Admission medications   Medication Sig Start Date End Date Taking? Authorizing Provider  allopurinol (ZYLOPRIM) 300 MG tablet TAKE 1 TABLET BY MOUTH EVERY DAY 10/23/18  Yes [place], [name], MD  AMITIZA 24 MCG capsule TAKE 1 CAPSULE(24 MCG) BY MOUTH TWICE DAILY WITH A MEAL 05/08/18  Yes Annitta Needs, NP  amLODipine (NORVASC) 10  MG tablet TAKE 1 TABLET BY MOUTH DAILY 09/07/18  Yes [place], [name], MD  cloNIDine (CATAPRES) 0.2 MG tablet TAKE 1 TABLET BY MOUTH THREE TIMES DAILY 09/18/18  Yes [place], [name], MD  famotidine (PEPCID) 20 MG tablet Take 1 tablet (20 mg total) by mouth daily. 07/27/18  Yes Francine Graven, DO  furosemide (LASIX) 40 MG tablet TAKE 1 TABLET BY MOUTH DAILY 09/17/18  Yes [place], [name], MD  gabapentin (NEURONTIN) 300 MG capsule TAKE 2 CAPSULES(600 MG) BY MOUTH TWICE DAILY, MAY TAKE AN ADDITIONAL 600 MG MID DAY AS NEEDED FOR PAIN 09/25/18  Yes [place], [name], MD  Insulin Glargine (LANTUS SOLOSTAR) 100 UNIT/ML Solostar Pen Inject 50 Units into the skin at bedtime. 10/29/18  Yes [place], Marella Chimes, MD  lisinopril (PRINIVIL,ZESTRIL) 20 MG tablet TAKE 1 TABLET BY MOUTH DAILY 10/01/18  Yes [place], [name], MD  metFORMIN (GLUCOPHAGE) 500 MG tablet TAKE 1 TABLET BY MOUTH TWICE DAILY 10/15/18  Yes [place], Marella Chimes, MD  metoprolol succinate (TOPROL-XL) 50 MG 24 hr tablet TAKE 1 TABLET BY MOUTH DAILY WITH OR  IMMEDIATELY FOLLOWING A MEAL 09/04/18  Yes [place], [name], MD  [facility] DELICA LANCETS FINE MISC USE AS DIRECTED FOUR TIMES DAILY 11/14/17  Yes [place], Marella Chimes, MD  [facility] VERIO test strip TEST FOUR TIMES DAILY 10/29/18  Yes Cassandria Anger, MD  oxyCODONE-acetaminophen (PERCOCET) 7.5-325 MG tablet Take 1 tablet by mouth every 4 (four) hours as needed. 11/05/18  Yes [place], [name], MD  pantoprazole (PROTONIX) 40 MG tablet TAKE 1 TABLET BY MOUTH DAILY 10/23/18  Yes [place], [name], MD  potassium chloride SA (K-DUR,KLOR-CON) 20 MEQ tablet TAKE 1 TABLET(20 MEQ) BY MOUTH DAILY 10/29/18  Yes [place], [name], MD  simvastatin (ZOCOR) 20 MG tablet TAKE 1 TABLET BY MOUTH EVERY NIGHT AT BEDTIME 11/26/18  Yes [place], [name], MD  tamsulosin (FLOMAX) 0.4 MG CAPS capsule TAKE 1 CAPSULE BY MOUTH DAILY 10/02/18  Yes [place], [name], MD  tiZANidine (ZANAFLEX) 2 MG tablet TAKE 1 TABLET(2 MG) BY MOUTH TWICE DAILY AS NEEDED 11/26/18  Yes [place], [name], MD  Triamcinolone Acetonide (TRIAMCINOLONE 0.1 % CREAM : EUCERIN) CREA Apply 1 application topically 2 (two) times daily as needed. Patient taking differently: Apply 1 application topically 2 (two) times daily as needed for rash.  02/06/18  Yes [place], [name], MD  doxycycline (VIBRA-TABS) 100 MG tablet Take 1 tablet (100 mg total) by mouth 2 (two) times daily. Patient not taking: Reported on 12/01/2018 11/05/18   [name], MD  oxyCODONE (ROXICODONE) 5 MG immediate release tablet Take 1 tablet (5 mg total) by mouth every 4 (four) hours as needed for breakthrough pain. 12/01/18   Evalee Jefferson, PA-C    Family History Family History  Problem Relation Age of Onset  . Heart failure Mother   . Heart failure Father   . Heart failure Sister   . Heart failure Son     Social History Social History   Tobacco Use  . Smoking status: Former Smoker    Packs/day: 3.00    Years: 25.00    Pack years: 75.00    Types: Cigarettes     Last attempt to quit: 03/01/1997    Years since quitting: 21.7  . Smokeless tobacco: Never Used  Substance Use Topics  . Alcohol use: No  . Drug use: No     Allergies   Patient has no known allergies.   Review of Systems Review  of Systems  Constitutional: Negative for chills and fever.  HENT: Negative.   Gastrointestinal: Positive for abdominal pain. Negative for nausea and vomiting.  Musculoskeletal: Positive for arthralgias, back pain and neck pain. Negative for joint swelling and myalgias.  Neurological: Negative for weakness, numbness and headaches.     Physical Exam Updated Vital Signs BP (!) 178/106 (BP Location: Left Arm)   Pulse 90   Temp 99 F (37.2 C) (Oral)   Resp 18   Ht 6' (1.829 m)   Wt 121.1 kg   SpO2 98%   BMI 36.21 kg/m   Physical Exam Vitals signs and nursing note reviewed.  Constitutional:      General: He is not in acute distress.    Appearance: Normal appearance. He is well-developed. He is obese.  HENT:     Head: Normocephalic and atraumatic.  Eyes:     Conjunctiva/sclera: Conjunctivae normal.  Neck:     Musculoskeletal: Muscular tenderness present.     Comments: ttp bilateral paracervical musculature.   Cardiovascular:     Rate and Rhythm: Normal rate and regular rhythm.     Heart sounds: Normal heart sounds.  Pulmonary:     Effort: Pulmonary effort is normal.     Breath sounds: Normal breath sounds. No wheezing.  Abdominal:     General: Bowel sounds are normal.     Palpations: Abdomen is soft.     Tenderness: There is abdominal tenderness in the left lower quadrant. There is left CVA tenderness.  Musculoskeletal: Normal range of motion.     Right hip: Normal.     Left hip: Normal.     Lumbar back: He exhibits bony tenderness. He exhibits no deformity.     Comments: Pt without extremity pain.   Skin:    General: Skin is warm and dry.  Neurological:     Mental Status: He is alert.     Comments: Weakness right upper and lower  extremities (baseline).      ED Treatments / Results  Labs (all labs ordered are listed, but only abnormal results are displayed) Labs Reviewed  CBC WITH DIFFERENTIAL/PLATELET - Abnormal; Notable for the following components:      Result Value   RBC 5.82 (*)    MCV 77.1 (*)    MCH 23.0 (*)    MCHC 29.8 (*)    RDW 18.2 (*)    Neutro Abs 8.4 (*)    All other components within normal limits  COMPREHENSIVE METABOLIC PANEL - Abnormal; Notable for the following components:   Glucose, Bld 106 (*)    Creatinine, Ser 1.31 (*)    Calcium 10.5 (*)    Total Bilirubin 0.2 (*)    GFR calc non Af Amer 54 (*)    All other components within normal limits  URINALYSIS, ROUTINE W REFLEX MICROSCOPIC - Abnormal; Notable for the following components:   Color, Urine STRAW (*)    Protein, ur 100 (*)    All other components within normal limits  CBG MONITORING, ED    EKG None  Radiology Ct Head Wo Contrast  Result Date: 12/01/2018 CLINICAL DATA:  [place] in the home today. EXAM: CT HEAD WITHOUT CONTRAST CT CERVICAL SPINE WITHOUT CONTRAST TECHNIQUE: Multidetector CT imaging of the head and cervical spine was performed following the standard protocol without intravenous contrast. Multiplanar CT image reconstructions of the cervical spine were also generated. COMPARISON:  MRI 04/02/2018.  CT 02/28/2018 FINDINGS: CT HEAD FINDINGS Brain: Old infarction in the left basal  ganglia and deep white matter tracts with atrophy, encephalomalacia and gliosis. No sign of acute infarction, mass lesion, hemorrhage, hydrocephalus or extra-axial collection. Pronounced wall air in degeneration of the brainstem on the left. Vascular: There is atherosclerotic calcification of the major vessels at the base of the brain. Skull: Negative Sinuses/Orbits: No significant sinus inflammatory disease. Other: Chronic thickening the bone at the junction of the anterior wall maxillary sinus and nasal bones on the left, possibly due to a  hemangioma or some focal fibrous dysplasia. CT CERVICAL SPINE FINDINGS Alignment: Mild kyphotic curvature in the cervical region which is chronic. 2 mm degenerative anterolisthesis C7-T1. Skull base and vertebrae: No fracture or acute finding. Chronic fusion at C3-4 and from C5 through C7. Soft tissues and spinal canal: Negative Disc levels: Foramen magnum is sufficiently patent. There is osteoarthritis at the C1-2 articulation but no encroachment upon the neural structures. C2-3: Facet osteoarthritis right worse than left. No compressive stenosis. C3-4: Chronic fusion.  No stenosis. C4-5: Disc degeneration with endplate osteophytes and bulging of the disc. Narrowing of the canal with AP diameter as narrow as 7.5 mm. Bilateral bony foraminal narrowing. C5 through C7: Distant fusion. Sufficient patency of the canal and foramina. C7-T1: Chronic facet arthropathy with 2 mm of anterolisthesis. Chronic disc degeneration with loss of disc height and endplate sclerosis. No central canal stenosis. Bilateral foraminal narrowing that could affect either C8 nerve. Upper chest: Emphysema and scarring. Other: None IMPRESSION: Head CT: No acute or traumatic finding. Old infarction in the left basal ganglia and radiating white matter tracts. Cervical spine CT: No acute or traumatic finding. Extensive and chronic degenerative changes as outlined above. Potential for significant canal and foraminal stenosis at C4-5. Potential for significant foraminal stenosis at C7-T1. Electronically Signed   By: Nelson Chimes M.D.   On: 12/01/2018 13:43   Ct Cervical Spine Wo Contrast  Result Date: 12/01/2018 CLINICAL DATA:  [place] in the home today. EXAM: CT HEAD WITHOUT CONTRAST CT CERVICAL SPINE WITHOUT CONTRAST TECHNIQUE: Multidetector CT imaging of the head and cervical spine was performed following the standard protocol without intravenous contrast. Multiplanar CT image reconstructions of the cervical spine were also generated. COMPARISON:   MRI 04/02/2018.  CT 02/28/2018 FINDINGS: CT HEAD FINDINGS Brain: Old infarction in the left basal ganglia and deep white matter tracts with atrophy, encephalomalacia and gliosis. No sign of acute infarction, mass lesion, hemorrhage, hydrocephalus or extra-axial collection. Pronounced wall air in degeneration of the brainstem on the left. Vascular: There is atherosclerotic calcification of the major vessels at the base of the brain. Skull: Negative Sinuses/Orbits: No significant sinus inflammatory disease. Other: Chronic thickening the bone at the junction of the anterior wall maxillary sinus and nasal bones on the left, possibly due to a hemangioma or some focal fibrous dysplasia. CT CERVICAL SPINE FINDINGS Alignment: Mild kyphotic curvature in the cervical region which is chronic. 2 mm degenerative anterolisthesis C7-T1. Skull base and vertebrae: No fracture or acute finding. Chronic fusion at C3-4 and from C5 through C7. Soft tissues and spinal canal: Negative Disc levels: Foramen magnum is sufficiently patent. There is osteoarthritis at the C1-2 articulation but no encroachment upon the neural structures. C2-3: Facet osteoarthritis right worse than left. No compressive stenosis. C3-4: Chronic fusion.  No stenosis. C4-5: Disc degeneration with endplate osteophytes and bulging of the disc. Narrowing of the canal with AP diameter as narrow as 7.5 mm. Bilateral bony foraminal narrowing. C5 through C7: Distant fusion. Sufficient patency of the canal and foramina. C7-T1:  Chronic facet arthropathy with 2 mm of anterolisthesis. Chronic disc degeneration with loss of disc height and endplate sclerosis. No central canal stenosis. Bilateral foraminal narrowing that could affect either C8 nerve. Upper chest: Emphysema and scarring. Other: None IMPRESSION: Head CT: No acute or traumatic finding. Old infarction in the left basal ganglia and radiating white matter tracts. Cervical spine CT: No acute or traumatic finding.  Extensive and chronic degenerative changes as outlined above. Potential for significant canal and foraminal stenosis at C4-5. Potential for significant foraminal stenosis at C7-T1. Electronically Signed   By: Nelson Chimes M.D.   On: 12/01/2018 13:43   Ct Abdomen Pelvis W Contrast  Result Date: 12/01/2018 CLINICAL DATA:  Fall this morning. Blunt abdominal trauma. Abdominal pain. Initial encounter. EXAM: CT ABDOMEN AND PELVIS WITH CONTRAST TECHNIQUE: Multidetector CT imaging of the abdomen and pelvis was performed using the standard protocol following bolus administration of intravenous contrast. CONTRAST:  184m ISOVUE-300 IOPAMIDOL (ISOVUE-300) INJECTION 61% COMPARISON:  Noncontrast CT on 07/27/2018 FINDINGS: Lower chest: No acute findings. Bilateral lower lobe bronchiectasis again noted. Hepatobiliary: No hepatic laceration or mass identified. Tiny sub-cm cysts remain stable. Prior cholecystectomy. No evidence of biliary obstruction. Pancreas: No parenchymal laceration, mass, or inflammatory changes identified. Spleen: No evidence of splenic laceration. Adrenal/Urinary Tract: No hemorrhage or parenchymal lacerations identified. Multiple small renal cysts are again seen bilaterally. No evidence of mass or hydronephrosis. Unremarkable unopacified urinary bladder. Stomach/Bowel: No evidence of hemoperitoneum. Colonic diverticulosis again seen, without evidence of diverticulitis. Normal appendix visualized. Vascular/Lymphatic: No evidence of abdominal aortic injury. Aortic atherosclerosis. No pathologically enlarged lymph nodes identified. Reproductive:  No mass or other significant abnormality identified. Other:  None. Musculoskeletal: No acute fractures or suspicious bone lesions identified. IMPRESSION: No evidence of visceral injury or other acute findings. Colonic diverticulosis, without radiographic evidence of diverticulitis. Electronically Signed   By: JEarle GellM.D.   On: 12/01/2018 13:57   Ct L-spine  No Charge  Result Date: 12/01/2018 CLINICAL DATA:  [place] the [place] today.  Back pain. EXAM: CT LUMBAR SPINE WITHOUT CONTRAST TECHNIQUE: Multidetector CT imaging of the lumbar spine was performed without intravenous contrast administration. Multiplanar CT image reconstructions were also generated. COMPARISON:  Previous abdominal CT scans as distant as 06/20/2014 FINDINGS: Segmentation: 5 lumbar type vertebral bodies.  S1 is transitional. Alignment: Normal Vertebrae: Acute superior endplate fracture at L2 with loss of height of 20%. No retropulsed bone. Chronic lytic and sclerotic changes of the L3 vertebral body, unchanged since 2015 and most consistent with previous treated disease. No acute exacerbation at this level. Chronic degenerative endplate changes at LJ4-G9 Paraspinal and other soft tissues: See results of abdominal CT. Disc levels: No canal or foraminal stenosis. Mild facet osteoarthritis at L3-4, L4-5 and L5-S1. Chronic disc degeneration at L5-S1 but without disc herniation or neural compressive stenosis. IMPRESSION: Acute superior endplate fracture at L2 with loss of height of 20%. No retropulsed bone. Chronic lytic and sclerotic changes of L3, unchanged since 2015. No evidence of traumatic exacerbate shin. This is probably a site of previously treated disease. Electronically Signed   By: MNelson ChimesM.D.   On: 12/01/2018 13:56    Procedures Procedures (including critical care time)  Medications Ordered in ED Medications  ondansetron (ZOFRAN-ODT) disintegrating tablet 4 mg (4 mg Oral Given 12/01/18 0941)  oxyCODONE-acetaminophen (PERCOCET/ROXICET) 5-325 MG per tablet 2 tablet (2 tablets Oral Given 12/01/18 0941)  iopamidol (ISOVUE-300) 61 % injection 100 mL (100 mLs Intravenous Contrast Given 12/01/18 1233)  Initial Impression / Assessment and Plan / ED Course  I have reviewed the triage vital signs and the nursing notes.  Pertinent labs & imaging results that were available  during my care of the patient were reviewed by me and considered in my medical decision making (see chart for details).     Imaging reviewed and discussed with patient and his family.  He has complaints of persistent pain although somewhat improved.  He is chronically on oxycodone 7.5 mg twice daily.  We have added additional OxyIR 5 mg tablets which she can take for breakthrough pain.  I have discussed patient's CT findings with Dr. Earleen Newport with [facility] imaging who will have his office contact pt for OV to discuss intervention/kyphoplasty.  This was discussed with family and patient and they are interested in pursuing this intervention.    Final Clinical Impressions(s) / ED Diagnoses   Final diagnoses:  Fall  Fall, initial encounter  Compression fracture of L2 vertebra, initial encounter [facility])    ED Discharge Orders         Ordered    oxyCODONE (ROXICODONE) 5 MG immediate release tablet  Every 4 hours PRN     12/01/18 1437           Evalee Jefferson, PA-C 12/01/18 1502    Sherwood Gambler, MD 12/02/18 [phone]

## 2018-12-03 ENCOUNTER — Other Ambulatory Visit: Payer: Self-pay | Admitting: Family Medicine

## 2018-12-03 ENCOUNTER — Other Ambulatory Visit: Payer: Self-pay | Admitting: Interventional Radiology

## 2018-12-03 ENCOUNTER — Telehealth: Payer: Self-pay | Admitting: Family Medicine

## 2018-12-03 DIAGNOSIS — S32000A Wedge compression fracture of unspecified lumbar vertebra, initial encounter for closed fracture: Secondary | ICD-10-CM

## 2018-12-03 DIAGNOSIS — S32020A Wedge compression fracture of second lumbar vertebra, initial encounter for closed fracture: Secondary | ICD-10-CM

## 2018-12-03 IMAGING — CR DG CHEST 2V
2 series · 2 of 2 positions shown · non-contrast
Comparison: None.

CLINICAL DATA: Preop lung cancer.

EXAM:
CHEST - 2 VIEW

[w chest lat]
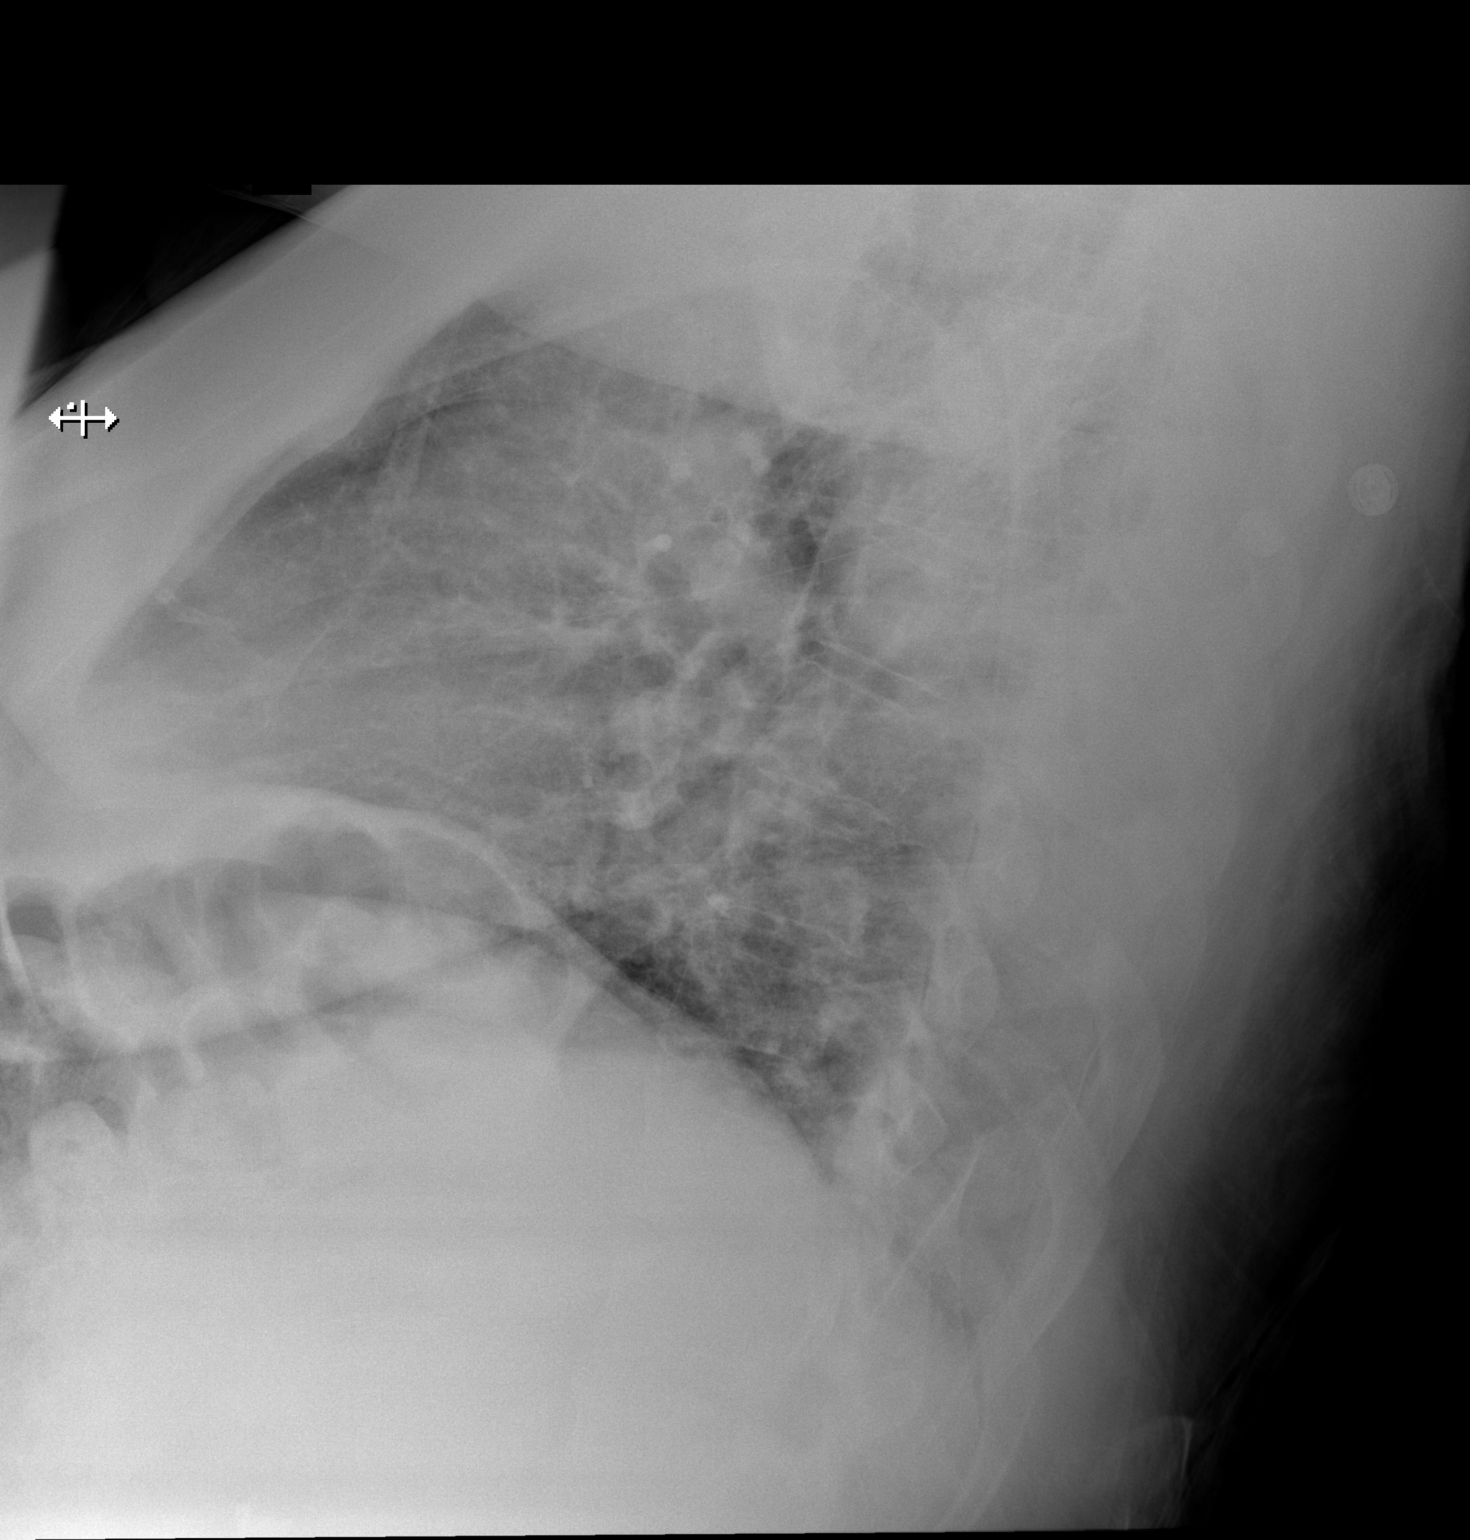

[x chest ap]
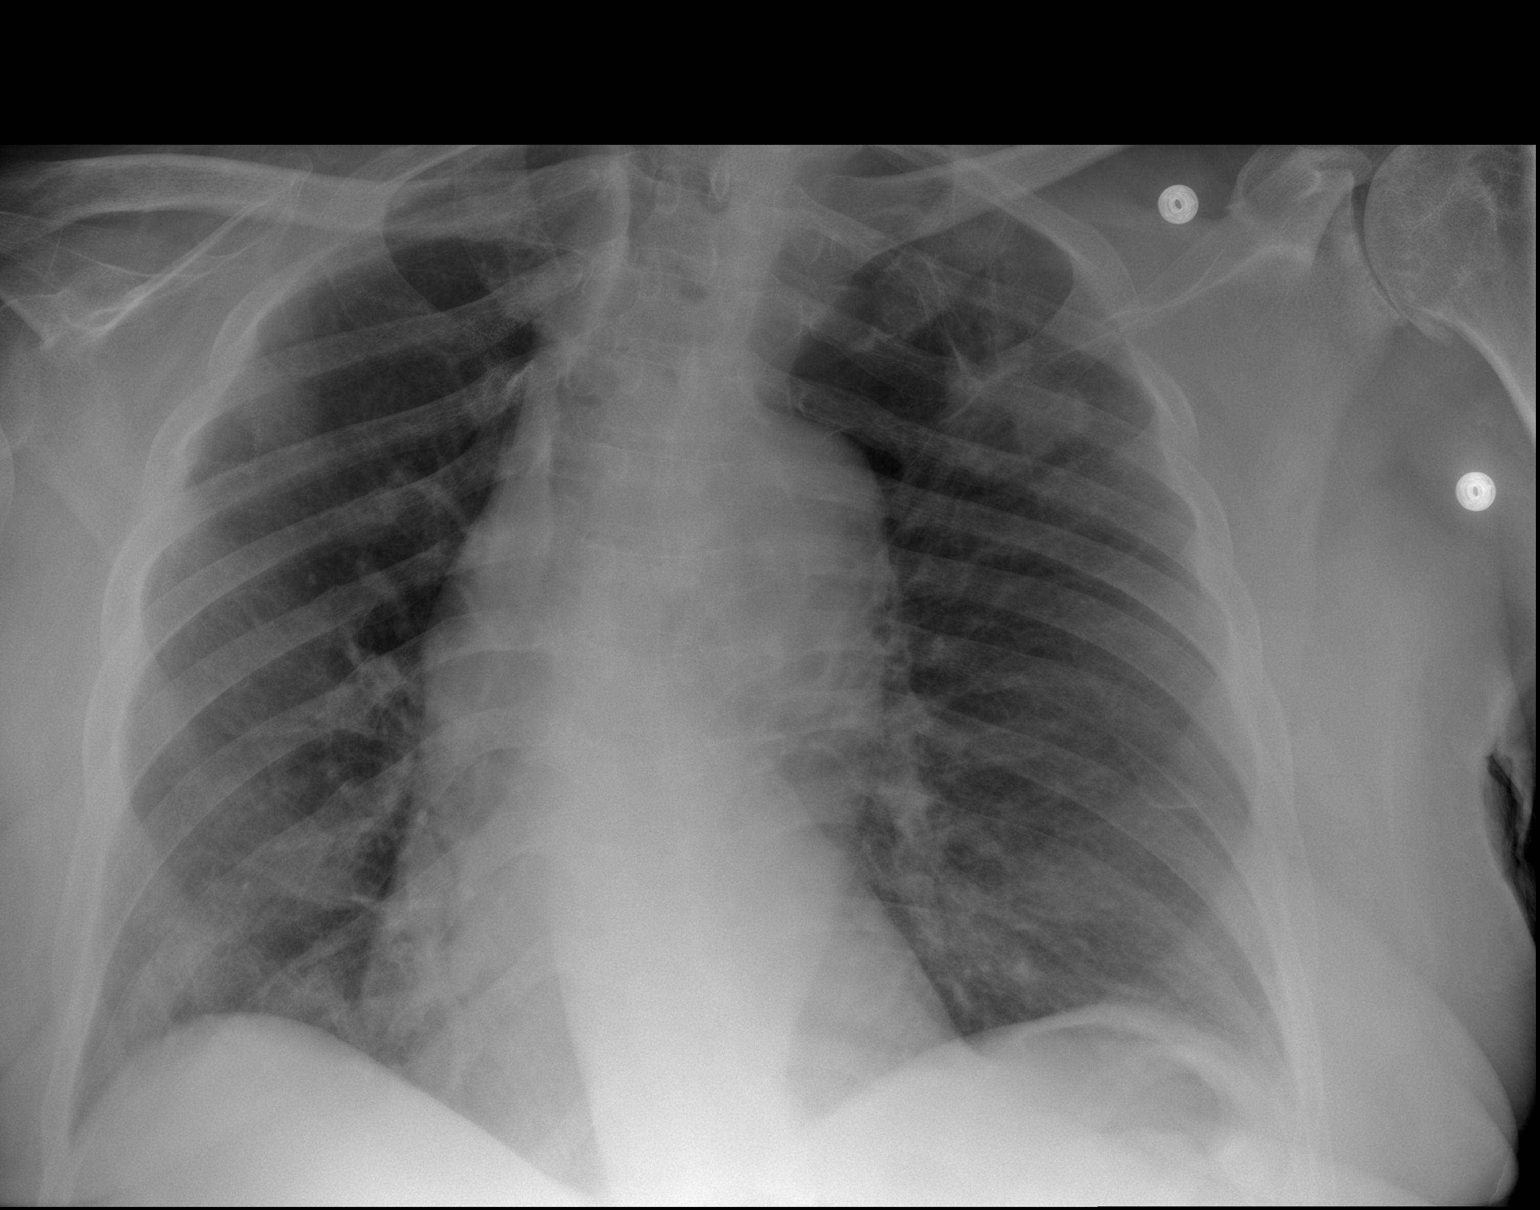

[2 of 2 positions shown; findings below may reference images not displayed]

FINDINGS: The heart is enlarged. Calcified tortuous aorta. Degenerative change
thoracic spine. LEFT upper lobe mass, representing lung cancer.
Subsegmental atelectasis both bases.
IMPRESSION: LEFT upper lobe lung cancer. Bibasilar subsegmental atelectasis, but
no infiltrates or edema.

## 2018-12-03 NOTE — Telephone Encounter (Signed)
Patient wife called in stating that patient was seen on 12/01/18 at [facility] for a fall. CT was performed and patient has a compression fracture at L2. ER recommended patient go to [place] for  Kyphoplasty. Patient's wife stated that she called them and was told that they will not have a interventional radiologist in at [place] until January 9th. She wanted to know if you were willing to order it and we call [facility] and see if we can get it scheduled there sooner. Please advise?

## 2018-12-03 NOTE — Progress Notes (Signed)
Patient to see interventional radiology for L2 fracture

## 2018-12-03 NOTE — Telephone Encounter (Signed)
Order placed for interventional radiology eval and treat. I called interventional radiology at [facility] and left a message to see how we are to proceed with getting patient scheduled. Awaiting phone call also will place call to patient's wife to notify her.

## 2018-12-03 NOTE — Telephone Encounter (Signed)
Okay to place orders, I am not sure exactly what they need Dx- Lumbar compression fracture

## 2018-12-04 ENCOUNTER — Ambulatory Visit (HOSPITAL_COMMUNITY)
Admission: RE | Admit: 2018-12-04 | Discharge: 2018-12-04 | Disposition: A | Discharge status: Home / Self Care | Payer: Medicare Other | Source: Ambulatory Visit | Attending: Interventional Radiology | Admitting: Interventional Radiology

## 2018-12-04 ENCOUNTER — Other Ambulatory Visit (HOSPITAL_COMMUNITY): Payer: Self-pay | Admitting: Interventional Radiology

## 2018-12-04 DIAGNOSIS — S32020A Wedge compression fracture of second lumbar vertebra, initial encounter for closed fracture: Secondary | ICD-10-CM | POA: Diagnosis not present

## 2018-12-04 DIAGNOSIS — M47816 Spondylosis without myelopathy or radiculopathy, lumbar region: Secondary | ICD-10-CM | POA: Insufficient documentation

## 2018-12-04 DIAGNOSIS — S32000A Wedge compression fracture of unspecified lumbar vertebra, initial encounter for closed fracture: Secondary | ICD-10-CM

## 2018-12-04 DIAGNOSIS — M4856XA Collapsed vertebra, not elsewhere classified, lumbar region, initial encounter for fracture: Secondary | ICD-10-CM | POA: Diagnosis not present

## 2018-12-04 DIAGNOSIS — S32010A Wedge compression fracture of first lumbar vertebra, initial encounter for closed fracture: Secondary | ICD-10-CM | POA: Diagnosis not present

## 2018-12-04 NOTE — Consult Note (Signed)
Chief Complaint: Patient was seen in consultation today for L2 compression fracture.  Referring Physician(s): Corrie Mckusick  Supervising Physician: Luanne Bras  Patient Status: [facility] - Out-pt  History of Present Illness: [name] is a 72 y.o. male with a past medical history of hypertension, hyperlipidemia, CVA 1998, asthma, lung cancer, GERD, Barrett's esophagus, hiatal hernia, gastroparesis, steatohepatitis, eosinophilic hepatitis, internal hemorrhoids, hemorrhagic colitis, diverticulosis, renal insufficiency, BPH, lymphoma, parotid gland cancer (adenocarcinoma), diabetes mellitus, peripheral neuropathy, gout, arthritis, and glaucoma. He presented to [facility] ED 12/01/2018 after suffering a fall while walking to the bathroom. He is mostly sedentary, being wheelchair bound due to chronic right-sided weakness secondary to CVA 1998, buyt can transfer and walk short distances with a cane. After the fall, he immediately felt low back pain with radiation around to his left lower abdomen. Upon work-up, he was found to have a L2 compression fracture. IR was consulted, and Dr. Earleen Newport recommended consult to Dr. Estanislado Pandy for further management.  CT lumbar spine 12/01/2018 1. Acute superior endplate fracture at L2 with loss of height of 20%. No retropulsed bone. 2. Chronic lytic and sclerotic changes of L3, unchanged since 2015. No evidence of traumatic exacerbate shin. This is probably a site of previously treated disease.  IR requested by Dr. Earleen Newport for management of L2 compression fracture. Patient awake and alert sitting in wheelchair. Accompanied by wife and brother-in-law. Complains of low back pain, rated anywhere from 5-8/10. States he takes Percocet and Roxicodone with little relief of pain. Denies fever, chills, numbness/tingling/pain shooting down legs, bladder/bowel incontinence, or urinary symptoms including dysuria or frequency.   Past Medical History:  Diagnosis Date  .  Arthritis   . Asthma    "hx of"  . Barrett's esophagus    EGD 03/23/2011 & EGD 2/09 bx proven  . BPH (benign prostatic hyperplasia)   . Cancer of parotid gland ([place]) 11/23/12   Adenocarcinoma  . Chronic abdominal pain   . Chronic constipation   . Colon polyp 03/23/2011   tubular adenoma, Dr. Gala Romney  . 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 [place]) 07/15/2011  . CVA (cerebral infarction) 1998   right sided deficit  . Delayed gastric emptying 2018  . Diverticulosis    TCS 03/23/11 pancolonic diverticula &TCS 5/08, pancolonic diverticula  . DM (diabetes mellitus) ([place])   . Edema of lower extremity 12/21/12   bilateral   . Esotropia of left eye   . GERD (gastroesophageal reflux disease)   . Glaucoma (increased eye pressure)   . Gout   . Gout   . Hemorrhagic colitis 06/06/2012.  [name] Hemorrhoids, internal 03/23/2011   tcs by Dr. Gala Romney  . Hepatitis    esosiniphilic, tx with prednisone  . Hiatal hernia   . History of radiation therapy 05/21/18- 05/30/18   Left Lung/ 54 Gy delivered in 3 fractions of 18 Gy. SBRT  . HTN (hypertension)   . Hx of radiation therapy 1974   right base of skull area-lymphoma  . Hyperlipidemia   . Lower facial weakness    Right  . Lymphoma ([place]) 1974   XRT at [facility], right base of skull area  . Neuropathy   . Peripheral edema    R>L legs  . Rash    chronic, recurrent, R>L legs  . Renal insufficiency   . Steatohepatitis    liver biopsy 2009  . Stroke [facility]) 1998   right hemiparesis/plegia    Past Surgical History:  Procedure Laterality Date  . BIOPSY  12/01/2016   Procedure: BIOPSY;  Surgeon: Daneil Dolin, MD;  Location: [facility] ENDO SUITE;  Service: Endoscopy;;  duodenum, gastric, esophagus  . CHOLECYSTECTOMY    . COLONOSCOPY  03/23/11   Dr. Gala Romney  pancolonic diverticula, hemorrhoids, tubular adenoma.. next tcs 03/2016  . COLONOSCOPY WITH PROPOFOL N/A 12/01/2016   inadequate bowel prep precluded exam  . ESOPHAGOGASTRODUODENOSCOPY  02/05/08   goblet  cell metaplasia/negative for H.pylori  . ESOPHAGOGASTRODUODENOSCOPY  03/23/11   Dr. Gala Romney, barretts, hiatal hernia  . ESOPHAGOGASTRODUODENOSCOPY (EGD) WITH PROPOFOL N/A 12/01/2016   Dr. Gala Romney: Large amount of retained gastric contents precluded completion of the stomach. Mucosal changes were found in the stomach. Erosions and somewhat scalloped appearing mucosa present, reactive gastritis/no H pylori. Barrett's esophagus noted, no dysplasia on biopsy. Duodenal biopsies taken as well, benign, no evidence of eosinophilia.  [name] MASS BIOPSY  11/01/2012   Procedure: NECK MASS BIOPSY;  Surgeon: Ascencion Dike, MD;  Location: [facility] ORS;  Service: ENT;  Laterality: Right;  Excisional Bx Right Neck Mass; attempted external jugular cutdown of left side  . PAROTIDECTOMY  11/24/2012   Procedure: PAROTIDECTOMY;  Surgeon: Ascencion Dike, MD;  Location: [place];  Service: ENT;  Laterality: N/A;  Total parotidectomy  . PLEURECTOMY    . right lymph node removal Right    behind right ear  . Right video-assisted thoracic surgery, pleurectomy, and pleurodesis  2011  . VIDEO BRONCHOSCOPY WITH ENDOBRONCHIAL NAVIGATION N/A 04/26/2018   Procedure: VIDEO BRONCHOSCOPY WITH ENDOBRONCHIAL NAVIGATION;  Surgeon: Melrose Nakayama, MD;  Location: [facility] OR;  Service: Thoracic;  Laterality: N/A;    Allergies: Patient has no known allergies.  Medications: Prior to Admission medications   Medication Sig Start Date End Date Taking? Authorizing Provider  allopurinol (ZYLOPRIM) 300 MG tablet TAKE 1 TABLET BY MOUTH EVERY DAY 10/23/18   [name], MD  AMITIZA 24 MCG capsule TAKE 1 CAPSULE(24 MCG) BY MOUTH TWICE DAILY WITH A MEAL 05/08/18   Annitta Needs, NP  amLODipine (NORVASC) 10 MG tablet TAKE 1 TABLET BY MOUTH DAILY 09/07/18   [name], MD  cloNIDine (CATAPRES) 0.2 MG tablet TAKE 1 TABLET BY MOUTH THREE TIMES DAILY 09/18/18   [name], MD  doxycycline (VIBRA-TABS) 100 MG tablet Take 1 tablet (100 mg total) by mouth 2  (two) times daily. Patient not taking: Reported on 12/01/2018 11/05/18   [name], MD  famotidine (PEPCID) 20 MG tablet Take 1 tablet (20 mg total) by mouth daily. 07/27/18   Francine Graven, DO  furosemide (LASIX) 40 MG tablet TAKE 1 TABLET BY MOUTH DAILY 09/17/18   [place], [name], MD  gabapentin (NEURONTIN) 300 MG capsule TAKE 2 CAPSULES(600 MG) BY MOUTH TWICE DAILY, MAY TAKE AN ADDITIONAL 600 MG MID DAY AS NEEDED FOR PAIN 09/25/18   [name], MD  Insulin Glargine (LANTUS SOLOSTAR) 100 UNIT/ML Solostar Pen Inject 50 Units into the skin at bedtime. 10/29/18   Cassandria Anger, MD  lisinopril (PRINIVIL,ZESTRIL) 20 MG tablet TAKE 1 TABLET BY MOUTH DAILY 10/01/18   [name], MD  metFORMIN (GLUCOPHAGE) 500 MG tablet TAKE 1 TABLET BY MOUTH TWICE DAILY 10/15/18   Cassandria Anger, MD  metoprolol succinate (TOPROL-XL) 50 MG 24 hr tablet TAKE 1 TABLET BY MOUTH DAILY WITH OR IMMEDIATELY FOLLOWING A MEAL 09/04/18   [place], [name], MD  [facility] DELICA LANCETS FINE MISC USE AS DIRECTED FOUR TIMES DAILY 11/14/17   Cassandria Anger, MD  [facility]  VERIO test strip TEST FOUR TIMES DAILY 10/29/18   Cassandria Anger, MD  oxyCODONE (ROXICODONE) 5 MG immediate release tablet Take 1 tablet (5 mg total) by mouth every 4 (four) hours as needed for breakthrough pain. 12/01/18   Evalee Jefferson, PA-C  oxyCODONE-acetaminophen (PERCOCET) 7.5-325 MG tablet Take 1 tablet by mouth every 4 (four) hours as needed. 11/05/18   [name], MD  pantoprazole (PROTONIX) 40 MG tablet TAKE 1 TABLET BY MOUTH DAILY 10/23/18   [name], MD  potassium chloride SA (K-DUR,KLOR-CON) 20 MEQ tablet TAKE 1 TABLET(20 MEQ) BY MOUTH DAILY 10/29/18   [name], MD  simvastatin (ZOCOR) 20 MG tablet TAKE 1 TABLET BY MOUTH EVERY NIGHT AT BEDTIME 11/26/18   [name], MD  tamsulosin (FLOMAX) 0.4 MG CAPS capsule TAKE 1 CAPSULE BY MOUTH DAILY 10/02/18   [place], [name], MD    tiZANidine (ZANAFLEX) 2 MG tablet TAKE 1 TABLET(2 MG) BY MOUTH TWICE DAILY AS NEEDED 11/26/18   [name], MD  Triamcinolone Acetonide (TRIAMCINOLONE 0.1 % CREAM : EUCERIN) CREA Apply 1 application topically 2 (two) times daily as needed. Patient taking differently: Apply 1 application topically 2 (two) times daily as needed for rash.  02/06/18   [name], MD     Family History  Problem Relation Age of Onset  . Heart failure Mother   . Heart failure Father   . Heart failure Sister   . Heart failure Son     Social History   Socioeconomic History  . Marital status: Married    Spouse name: Not on file  . Number of children: 1  . Years of education: Not on file  . Highest education level: Not on file  Occupational History  . Occupation: disabled  Social Needs  . Financial resource strain: Not on file  . Food insecurity:    Worry: Not on file    Inability: Not on file  . Transportation needs:    Medical: Not on file    Non-medical: Not on file  Tobacco Use  . Smoking status: Former Smoker    Packs/day: 3.00    Years: 25.00    Pack years: 75.00    Types: Cigarettes    Last attempt to quit: 03/01/1997    Years since quitting: 21.7  . Smokeless tobacco: Never Used  Substance and Sexual Activity  . Alcohol use: No  . Drug use: No  . Sexual activity: Not Currently  Lifestyle  . Physical activity:    Days per week: Not on file    Minutes per session: Not on file  . Stress: Not on file  Relationships  . Social connections:    Talks on phone: Not on file    Gets together: Not on file    Attends religious service: Not on file    Active member of club or organization: Not on file    Attends meetings of clubs or organizations: Not on file    Relationship status: Not on file  Other Topics Concern  . Not on file  Social History Narrative  . Not on file     Review of Systems: A 12 point ROS discussed and pertinent positives are indicated in the HPI above.   All other systems are negative.  Review of Systems  Constitutional: Negative for chills and fever.  Respiratory: Negative for shortness of breath and wheezing.   Cardiovascular: Negative for chest pain and palpitations.  Gastrointestinal:  Negative for bowel incontinence.  Genitourinary: Negative for dysuria and frequency.       Negative for bladder incontinence.  Musculoskeletal: Positive for back pain.  Neurological: Negative for numbness.  Psychiatric/Behavioral: Negative for behavioral problems and confusion.    Vital Signs: There were no vitals taken for this visit.  Physical Exam Vitals signs and nursing note reviewed.  Constitutional:      General: He is not in acute distress.    Appearance: Normal appearance.  Pulmonary:     Effort: Pulmonary effort is normal. No respiratory distress.  Musculoskeletal:     Comments: Moderate midline low back pain at approximate level of L2.  Skin:    General: Skin is warm and dry.  Neurological:     Mental Status: He is alert and oriented to person, place, and time.  Psychiatric:        Mood and Affect: Mood normal.        Behavior: Behavior normal.        Thought Content: Thought content normal.        Judgment: Judgment normal.      Imaging: Ct Head Wo Contrast  Result Date: 12/01/2018 CLINICAL DATA:  [place] in the home today. EXAM: CT HEAD WITHOUT CONTRAST CT CERVICAL SPINE WITHOUT CONTRAST TECHNIQUE: Multidetector CT imaging of the head and cervical spine was performed following the standard protocol without intravenous contrast. Multiplanar CT image reconstructions of the cervical spine were also generated. COMPARISON:  MRI 04/02/2018.  CT 02/28/2018 FINDINGS: CT HEAD FINDINGS Brain: Old infarction in the left basal ganglia and deep white matter tracts with atrophy, encephalomalacia and gliosis. No sign of acute infarction, mass lesion, hemorrhage, hydrocephalus or extra-axial collection. Pronounced wall air in  degeneration of the brainstem on the left. Vascular: There is atherosclerotic calcification of the major vessels at the base of the brain. Skull: Negative Sinuses/Orbits: No significant sinus inflammatory disease. Other: Chronic thickening the bone at the junction of the anterior wall maxillary sinus and nasal bones on the left, possibly due to a hemangioma or some focal fibrous dysplasia. CT CERVICAL SPINE FINDINGS Alignment: Mild kyphotic curvature in the cervical region which is chronic. 2 mm degenerative anterolisthesis C7-T1. Skull base and vertebrae: No fracture or acute finding. Chronic fusion at C3-4 and from C5 through C7. Soft tissues and spinal canal: Negative Disc levels: Foramen magnum is sufficiently patent. There is osteoarthritis at the C1-2 articulation but no encroachment upon the neural structures. C2-3: Facet osteoarthritis right worse than left. No compressive stenosis. C3-4: Chronic fusion.  No stenosis. C4-5: Disc degeneration with endplate osteophytes and bulging of the disc. Narrowing of the canal with AP diameter as narrow as 7.5 mm. Bilateral bony foraminal narrowing. C5 through C7: Distant fusion. Sufficient patency of the canal and foramina. C7-T1: Chronic facet arthropathy with 2 mm of anterolisthesis. Chronic disc degeneration with loss of disc height and endplate sclerosis. No central canal stenosis. Bilateral foraminal narrowing that could affect either C8 nerve. Upper chest: Emphysema and scarring. Other: None IMPRESSION: Head CT: No acute or traumatic finding. Old infarction in the left basal ganglia and radiating white matter tracts. Cervical spine CT: No acute or traumatic finding. Extensive and chronic degenerative changes as outlined above. Potential for significant canal and foraminal stenosis at C4-5. Potential for significant foraminal stenosis at C7-T1. Electronically Signed   By: Nelson Chimes M.D.   On: 12/01/2018 13:43   Ct Cervical Spine Wo Contrast  Result Date:  12/01/2018 CLINICAL DATA:  [place] in the  home today. EXAM: CT HEAD WITHOUT CONTRAST CT CERVICAL SPINE WITHOUT CONTRAST TECHNIQUE: Multidetector CT imaging of the head and cervical spine was performed following the standard protocol without intravenous contrast. Multiplanar CT image reconstructions of the cervical spine were also generated. COMPARISON:  MRI 04/02/2018.  CT 02/28/2018 FINDINGS: CT HEAD FINDINGS Brain: Old infarction in the left basal ganglia and deep white matter tracts with atrophy, encephalomalacia and gliosis. No sign of acute infarction, mass lesion, hemorrhage, hydrocephalus or extra-axial collection. Pronounced wall air in degeneration of the brainstem on the left. Vascular: There is atherosclerotic calcification of the major vessels at the base of the brain. Skull: Negative Sinuses/Orbits: No significant sinus inflammatory disease. Other: Chronic thickening the bone at the junction of the anterior wall maxillary sinus and nasal bones on the left, possibly due to a hemangioma or some focal fibrous dysplasia. CT CERVICAL SPINE FINDINGS Alignment: Mild kyphotic curvature in the cervical region which is chronic. 2 mm degenerative anterolisthesis C7-T1. Skull base and vertebrae: No fracture or acute finding. Chronic fusion at C3-4 and from C5 through C7. Soft tissues and spinal canal: Negative Disc levels: Foramen magnum is sufficiently patent. There is osteoarthritis at the C1-2 articulation but no encroachment upon the neural structures. C2-3: Facet osteoarthritis right worse than left. No compressive stenosis. C3-4: Chronic fusion.  No stenosis. C4-5: Disc degeneration with endplate osteophytes and bulging of the disc. Narrowing of the canal with AP diameter as narrow as 7.5 mm. Bilateral bony foraminal narrowing. C5 through C7: Distant fusion. Sufficient patency of the canal and foramina. C7-T1: Chronic facet arthropathy with 2 mm of anterolisthesis. Chronic disc degeneration with loss of disc  height and endplate sclerosis. No central canal stenosis. Bilateral foraminal narrowing that could affect either C8 nerve. Upper chest: Emphysema and scarring. Other: None IMPRESSION: Head CT: No acute or traumatic finding. Old infarction in the left basal ganglia and radiating white matter tracts. Cervical spine CT: No acute or traumatic finding. Extensive and chronic degenerative changes as outlined above. Potential for significant canal and foraminal stenosis at C4-5. Potential for significant foraminal stenosis at C7-T1. Electronically Signed   By: Nelson Chimes M.D.   On: 12/01/2018 13:43   Ct Abdomen Pelvis W Contrast  Result Date: 12/01/2018 CLINICAL DATA:  Fall this morning. Blunt abdominal trauma. Abdominal pain. Initial encounter. EXAM: CT ABDOMEN AND PELVIS WITH CONTRAST TECHNIQUE: Multidetector CT imaging of the abdomen and pelvis was performed using the standard protocol following bolus administration of intravenous contrast. CONTRAST:  157m ISOVUE-300 IOPAMIDOL (ISOVUE-300) INJECTION 61% COMPARISON:  Noncontrast CT on 07/27/2018 FINDINGS: Lower chest: No acute findings. Bilateral lower lobe bronchiectasis again noted. Hepatobiliary: No hepatic laceration or mass identified. Tiny sub-cm cysts remain stable. Prior cholecystectomy. No evidence of biliary obstruction. Pancreas: No parenchymal laceration, mass, or inflammatory changes identified. Spleen: No evidence of splenic laceration. Adrenal/Urinary Tract: No hemorrhage or parenchymal lacerations identified. Multiple small renal cysts are again seen bilaterally. No evidence of mass or hydronephrosis. Unremarkable unopacified urinary bladder. Stomach/Bowel: No evidence of hemoperitoneum. Colonic diverticulosis again seen, without evidence of diverticulitis. Normal appendix visualized. Vascular/Lymphatic: No evidence of abdominal aortic injury. Aortic atherosclerosis. No pathologically enlarged lymph nodes identified. Reproductive:  No mass or other  significant abnormality identified. Other:  None. Musculoskeletal: No acute fractures or suspicious bone lesions identified. IMPRESSION: No evidence of visceral injury or other acute findings. Colonic diverticulosis, without radiographic evidence of diverticulitis. Electronically Signed   By: JEarle GellM.D.   On: 12/01/2018 13:57   Ct L-spine  No Charge  Result Date: 12/01/2018 CLINICAL DATA:  [place] in the home today.  Back pain. EXAM: CT LUMBAR SPINE WITHOUT CONTRAST TECHNIQUE: Multidetector CT imaging of the lumbar spine was performed without intravenous contrast administration. Multiplanar CT image reconstructions were also generated. COMPARISON:  Previous abdominal CT scans as distant as 06/20/2014 FINDINGS: Segmentation: 5 lumbar type vertebral bodies.  S1 is transitional. Alignment: Normal Vertebrae: Acute superior endplate fracture at L2 with loss of height of 20%. No retropulsed bone. Chronic lytic and sclerotic changes of the L3 vertebral body, unchanged since 2015 and most consistent with previous treated disease. No acute exacerbation at this level. Chronic degenerative endplate changes at G6-Y4. Paraspinal and other soft tissues: See results of abdominal CT. Disc levels: No canal or foraminal stenosis. Mild facet osteoarthritis at L3-4, L4-5 and L5-S1. Chronic disc degeneration at L5-S1 but without disc herniation or neural compressive stenosis. IMPRESSION: Acute superior endplate fracture at L2 with loss of height of 20%. No retropulsed bone. Chronic lytic and sclerotic changes of L3, unchanged since 2015. No evidence of traumatic exacerbate shin. This is probably a site of previously treated disease. Electronically Signed   By: Nelson Chimes M.D.   On: 12/01/2018 13:56    Labs:  CBC: Recent Labs    04/13/18 1018 06/26/18 1516 07/27/18 0846 12/01/18 1103  WBC 8.2 10.8 7.6 10.2  HGB 12.7* 11.7* 13.2 13.4  HCT 40.4 36.9* 41.7 44.9  PLT 194 267 244 214    COAGS: Recent Labs     04/13/18 1018  INR 0.97  APTT 31    BMP: Recent Labs    07/19/18 0911 07/27/18 0846 10/25/18 0935 12/01/18 1103  NA 139 140 141 140  K 4.3 4.4 4.5 4.2  CL 106 104 105 108  CO2 24 26 25 24   GLUCOSE 89 148* 66 106*  BUN 20 19 15 20   CALCIUM 10.4* 10.8* 10.6*  10.6* 10.5*  CREATININE 1.35* 1.30* 1.34* 1.31*  GFRNONAA 52* 54* 53* 54*  GFRAA 61 >60 61 >60    LIVER FUNCTION TESTS: Recent Labs    04/13/18 1018 07/19/18 0911 07/27/18 0846 10/25/18 0935 12/01/18 1103  BILITOT 0.2* 0.2 0.7 0.3 0.2*  AST 15 10 15 11 18   ALT 16* 9 14 9 15   ALKPHOS 76  --  79  --  72  PROT 7.2 6.9 8.0 7.2 7.5  ALBUMIN 3.5  --  3.9  --  3.8    TUMOR MARKERS: No results for input(s): AFPTM, CEA, CA199, CHROMGRNA in the last 8760 hours.  Assessment and Plan:  L2 compression fracture. Reviewed imaging with patient and family. Brought to their attention was patient's L2 compression fracture. Explained that it is possible that patient's L1 vertebral body is also fractured. Discussed patient's cancer history and explained that these could be pathological fractures. Explained that the best course of management at this time is to obtain a MRI of lumbar spine. This will help [place] determine if patient's L1 vertebral body is fractured and if either fractures are pathologic. Discussed procedure options. If they are not pathologic fractures, discussed image-guided kyphoplasty/vertebroplasty, including risks and benefits. If they are pathologic fractures, discussed image-guided osteocool ablation with kyphoplasty/vertebroplasty, including risks and benefits. Patient expresses desire to move forward with MRI and procedure, whichever is indicated based on MRI.  Plan for MRI lumbar spine (T11-S1, without contrast) today. Informed patient that MRI will occur today, and they will hear from [place] regarding results and which procedure to move forward with.  Will send biopsy request form to Dr. Isidore Moos.  All questions  answered and concerns addressed. Patient and family convey understanding and agree with plan.  Thank you for this interesting consult.  I greatly enjoyed meeting PORTER MOES and look forward to participating in their care.  A copy of this report was sent to the requesting provider on this date.  Electronically Signed: Earley Abide, PA-C 12/04/2018, 9:30 AM   I spent a total of 30 Minutes in face to face in clinical consultation, greater than 50% of which was counseling/coordinating care for L2 compression fracture.

## 2018-12-06 ENCOUNTER — Telehealth: Payer: Self-pay | Admitting: *Deleted

## 2018-12-06 NOTE — Telephone Encounter (Signed)
Per appointment desk patient had appointment on 12/04/18

## 2018-12-06 NOTE — Telephone Encounter (Signed)
Received call from patient wife, [name].   Reports that she has some concerns about MRI and patient has some concerns about having back surgery.   Requested MD to recommend course of action.

## 2018-12-07 ENCOUNTER — Other Ambulatory Visit: Payer: Self-pay | Admitting: Family Medicine

## 2018-12-07 NOTE — Telephone Encounter (Signed)
Spoke to patient wife, they were concerned about a possible tumor as there was a lytic lesion noted on the CT scan however Dr. Verneda Skill ordered an MRI which I reviewed with him.  Appears that he has a acute subacute fracture which they need kyphoplasty on and he has a chronic vertebral fracture below that.  Of note his PET scan from a couple months ago did not show any abnormal lytic lesions.  Discussed with the kyphoplasty procedure entails he was concerned he was having a major back surgery.  They want to proceed with a kyphoplasty to help with his pain.    [place]- please call IR- see previous referral, patient has decided to have the kyphoplasty for the compression fracture. Has seen Dr. Bronson Curb once

## 2018-12-10 ENCOUNTER — Other Ambulatory Visit (HOSPITAL_COMMUNITY): Payer: Self-pay | Admitting: Interventional Radiology

## 2018-12-10 DIAGNOSIS — S32020A Wedge compression fracture of second lumbar vertebra, initial encounter for closed fracture: Secondary | ICD-10-CM

## 2018-12-10 NOTE — Telephone Encounter (Signed)
Per office notes and appointment desk patient is going to Interventional radiology on 12/13/17 for an ablation and biopsy of spine. Patient was notified of this by Interventional radiology today at [facility]

## 2018-12-10 NOTE — Telephone Encounter (Signed)
noted 

## 2018-12-11 ENCOUNTER — Other Ambulatory Visit: Payer: Self-pay | Admitting: Radiology

## 2018-12-11 IMAGING — CR DG CHEST 2V
2 series · 2 of 2 positions shown · non-contrast
Comparison: 04/18/2018

CLINICAL DATA: Left upper lobe mass

EXAM:
CHEST - 2 VIEW

[chest lat]
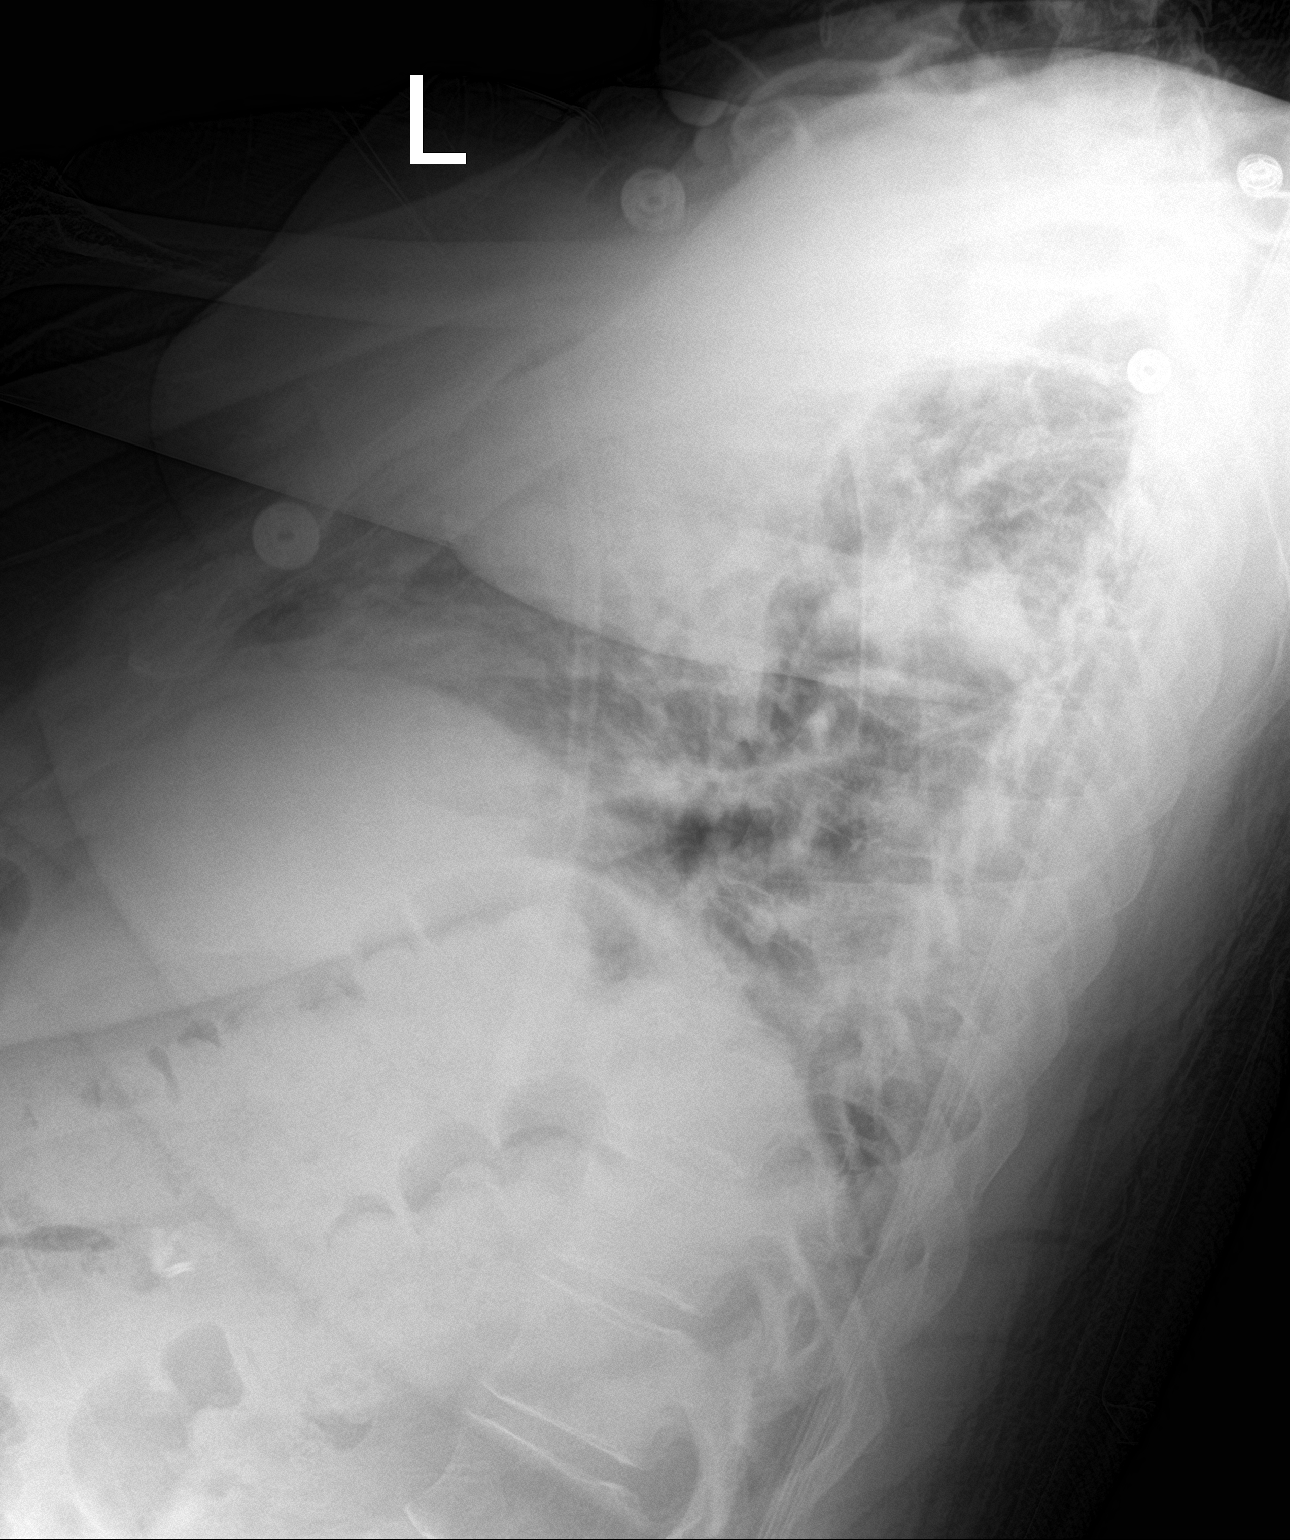

[chest ap]
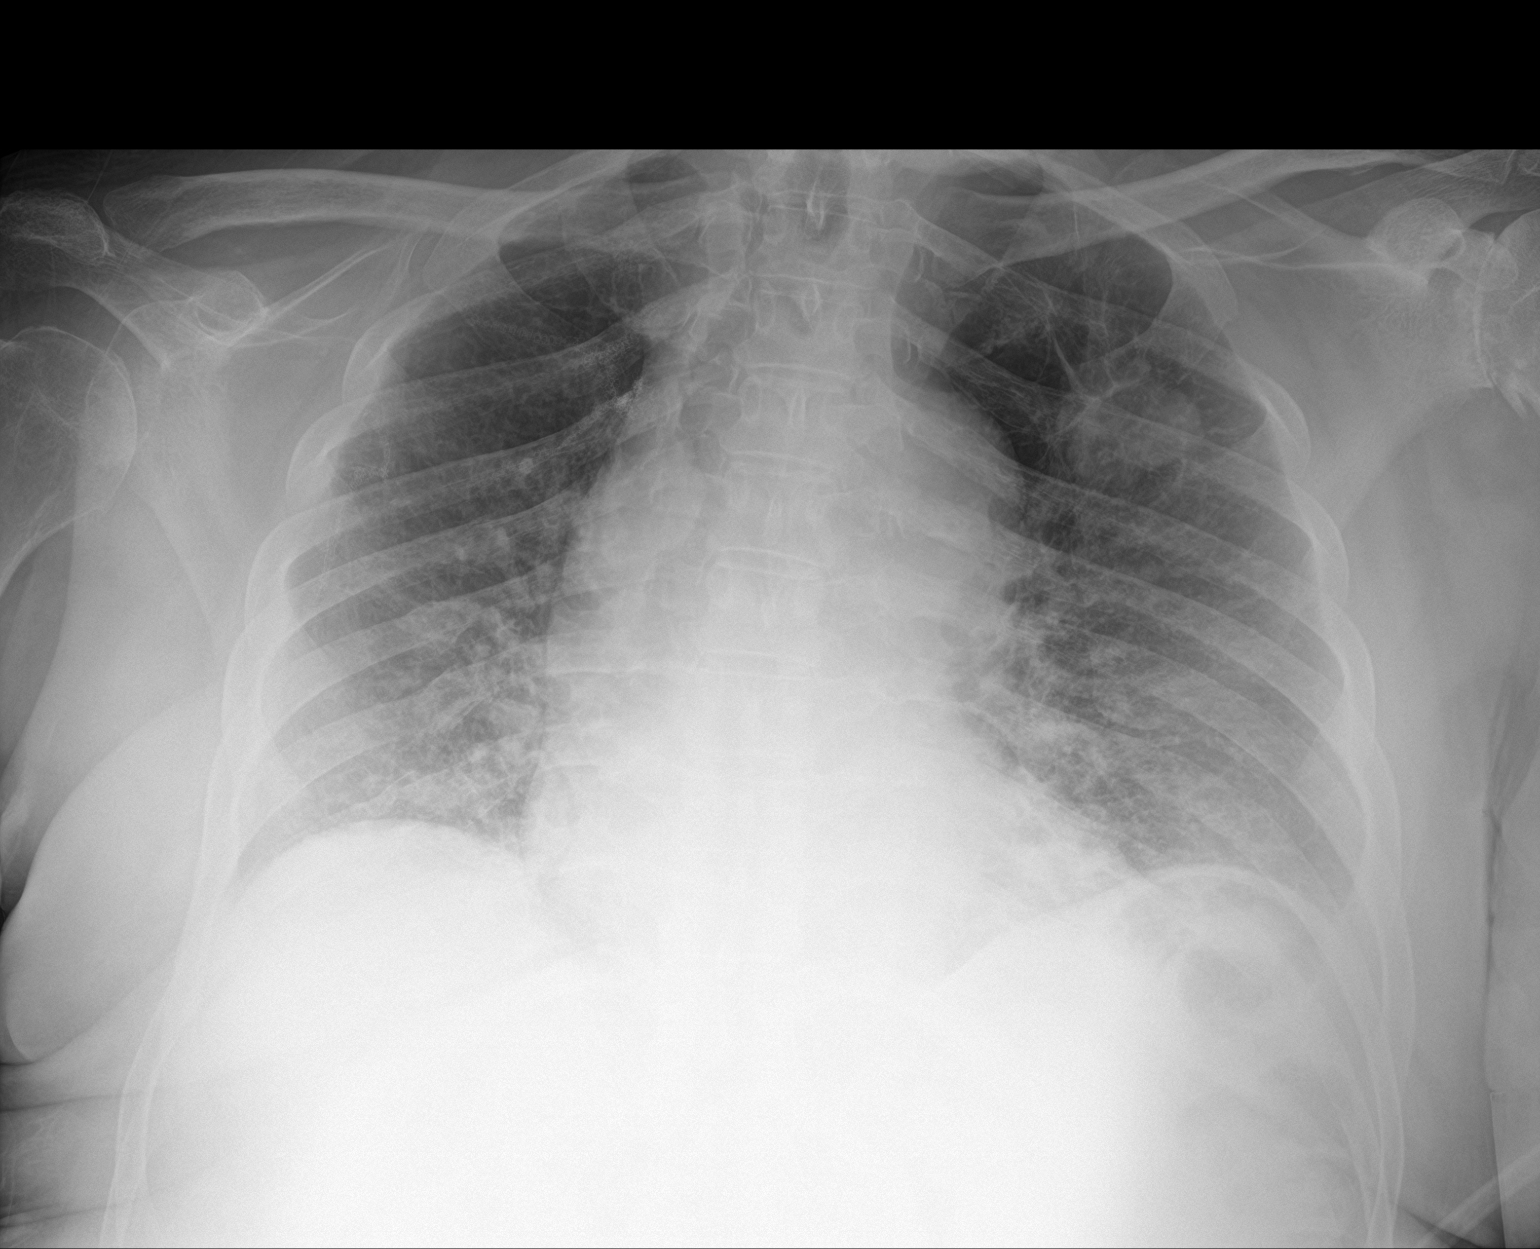

[2 of 2 positions shown; findings below may reference images not displayed]

FINDINGS: Left upper lobe mass lesion approximately 3 cm is unchanged. Left
apical blebs noted.

Bibasilar interstitial prominent lung markings have developed since
the prior study and most likely due to atelectasis. Decreased lung
volume bilaterally. No effusion.

Surgical staples right apex.
IMPRESSION: Left upper lobe mass lesion unchanged.  Left apical blebs.

Post surgical staples right apex

Hypoventilation with bibasilar atelectasis.

## 2018-12-11 IMAGING — DX DG CHEST 1V PORT
1 series · 1 of 1 positions shown · non-contrast
Comparison: Radiographs of same day.

CLINICAL DATA: Status post bronchoscopy with biopsy.

EXAM:
PORTABLE CHEST 1 VIEW

[chest]
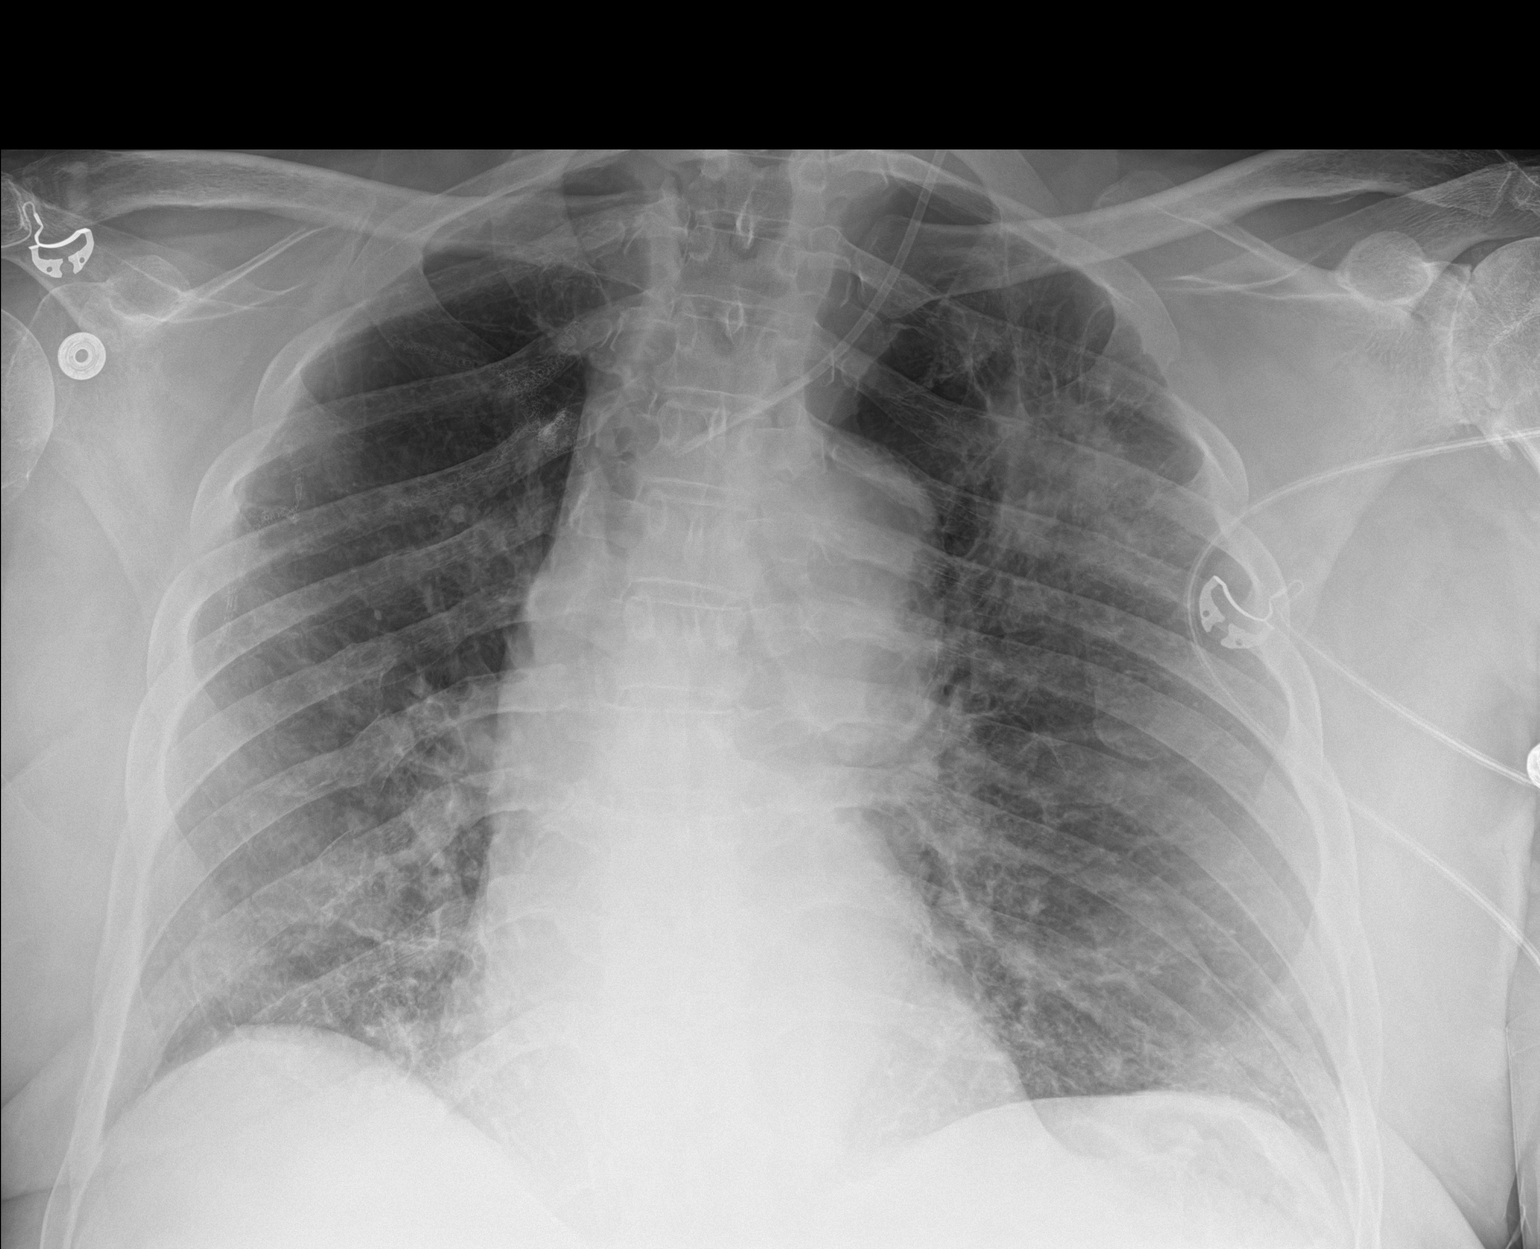

[1 of 1 positions shown; findings below may reference images not displayed]

FINDINGS: Stable cardiomediastinal silhouette. Interval placement of left
internal jugular catheter with distal tip in expected position of
left innominate vein. Stable left upper lobe mass is noted. Stable
emphysematous disease is noted in both upper lobes. Postsurgical
changes are noted in the right upper lobe. No pneumothorax or
pleural effusion is noted. Old right rib fractures are noted. Severe
degenerative changes seen involving the left glenohumeral joint.
IMPRESSION: Stable left upper lobe mass is noted.  No pneumothorax is noted.

Emphysema ([id number]).

## 2018-12-13 ENCOUNTER — Other Ambulatory Visit (HOSPITAL_COMMUNITY): Payer: Self-pay | Admitting: Interventional Radiology

## 2018-12-13 ENCOUNTER — Ambulatory Visit (HOSPITAL_COMMUNITY)
Admission: RE | Admit: 2018-12-13 | Discharge: 2018-12-13 | Disposition: A | Discharge status: Home / Self Care | Payer: Medicare Other | Source: Ambulatory Visit | Attending: Interventional Radiology | Admitting: Interventional Radiology

## 2018-12-13 ENCOUNTER — Encounter (HOSPITAL_COMMUNITY): Payer: Self-pay

## 2018-12-13 ENCOUNTER — Other Ambulatory Visit: Payer: Self-pay

## 2018-12-13 DIAGNOSIS — M109 Gout, unspecified: Secondary | ICD-10-CM | POA: Diagnosis not present

## 2018-12-13 DIAGNOSIS — K219 Gastro-esophageal reflux disease without esophagitis: Secondary | ICD-10-CM | POA: Diagnosis not present

## 2018-12-13 DIAGNOSIS — Z794 Long term (current) use of insulin: Secondary | ICD-10-CM | POA: Diagnosis not present

## 2018-12-13 DIAGNOSIS — Z85858 Personal history of malignant neoplasm of other endocrine glands: Secondary | ICD-10-CM | POA: Diagnosis not present

## 2018-12-13 DIAGNOSIS — M1990 Unspecified osteoarthritis, unspecified site: Secondary | ICD-10-CM | POA: Diagnosis not present

## 2018-12-13 DIAGNOSIS — S32020A Wedge compression fracture of second lumbar vertebra, initial encounter for closed fracture: Secondary | ICD-10-CM | POA: Insufficient documentation

## 2018-12-13 DIAGNOSIS — Z452 Encounter for adjustment and management of vascular access device: Secondary | ICD-10-CM | POA: Diagnosis not present

## 2018-12-13 DIAGNOSIS — Z8572 Personal history of non-Hodgkin lymphomas: Secondary | ICD-10-CM | POA: Diagnosis not present

## 2018-12-13 DIAGNOSIS — Z85118 Personal history of other malignant neoplasm of bronchus and lung: Secondary | ICD-10-CM | POA: Insufficient documentation

## 2018-12-13 DIAGNOSIS — E785 Hyperlipidemia, unspecified: Secondary | ICD-10-CM | POA: Diagnosis not present

## 2018-12-13 DIAGNOSIS — R937 Abnormal findings on diagnostic imaging of other parts of musculoskeletal system: Secondary | ICD-10-CM | POA: Diagnosis not present

## 2018-12-13 DIAGNOSIS — E1140 Type 2 diabetes mellitus with diabetic neuropathy, unspecified: Secondary | ICD-10-CM | POA: Insufficient documentation

## 2018-12-13 DIAGNOSIS — Z8673 Personal history of transient ischemic attack (TIA), and cerebral infarction without residual deficits: Secondary | ICD-10-CM | POA: Insufficient documentation

## 2018-12-13 DIAGNOSIS — Z87891 Personal history of nicotine dependence: Secondary | ICD-10-CM | POA: Diagnosis not present

## 2018-12-13 DIAGNOSIS — Z79899 Other long term (current) drug therapy: Secondary | ICD-10-CM | POA: Diagnosis not present

## 2018-12-13 DIAGNOSIS — X58XXXA Exposure to other specified factors, initial encounter: Secondary | ICD-10-CM | POA: Insufficient documentation

## 2018-12-13 DIAGNOSIS — Z8249 Family history of ischemic heart disease and other diseases of the circulatory system: Secondary | ICD-10-CM | POA: Diagnosis not present

## 2018-12-13 DIAGNOSIS — E119 Type 2 diabetes mellitus without complications: Secondary | ICD-10-CM | POA: Insufficient documentation

## 2018-12-13 DIAGNOSIS — M4856XA Collapsed vertebra, not elsewhere classified, lumbar region, initial encounter for fracture: Secondary | ICD-10-CM | POA: Diagnosis not present

## 2018-12-13 DIAGNOSIS — I878 Other specified disorders of veins: Secondary | ICD-10-CM

## 2018-12-13 DIAGNOSIS — I10 Essential (primary) hypertension: Secondary | ICD-10-CM | POA: Diagnosis not present

## 2018-12-13 DIAGNOSIS — M899 Disorder of bone, unspecified: Secondary | ICD-10-CM | POA: Diagnosis not present

## 2018-12-13 DIAGNOSIS — M4850XA Collapsed vertebra, not elsewhere classified, site unspecified, initial encounter for fracture: Secondary | ICD-10-CM | POA: Diagnosis not present

## 2018-12-13 DIAGNOSIS — H409 Unspecified glaucoma: Secondary | ICD-10-CM | POA: Insufficient documentation

## 2018-12-13 DIAGNOSIS — I252 Old myocardial infarction: Secondary | ICD-10-CM | POA: Diagnosis not present

## 2018-12-13 HISTORY — PX: IR FLUORO GUIDED NEEDLE PLC ASPIRATION/INJECTION LOC: IMG2395

## 2018-12-13 HISTORY — PX: IR US GUIDE VASC ACCESS LEFT: IMG2389

## 2018-12-13 HISTORY — PX: IR RADIOLOGY PERIPHERAL GUIDED IV START: IMG5598

## 2018-12-13 HISTORY — PX: IR KYPHO LUMBAR INC FX REDUCE BONE BX UNI/BIL CANNULATION INC/IMAGING: IMG5519

## 2018-12-13 LAB — BASIC METABOLIC PANEL
Anion gap: 9 (ref 5–15)
BUN: 17 mg/dL (ref 8–23)
CO2: 25 mmol/L (ref 22–32)
Calcium: 10.5 mg/dL — ABNORMAL HIGH (ref 8.9–10.3)
Chloride: 105 mmol/L (ref 98–111)
Creatinine, Ser: 1.54 mg/dL — ABNORMAL HIGH (ref 0.61–1.24)
GFR calc Af Amer: 51 mL/min — ABNORMAL LOW (ref >60)
GFR, EST NON AFRICAN AMERICAN: 44 mL/min — AB (ref >60)
Glucose, Bld: 82 mg/dL (ref 70–99)
POTASSIUM: 5 mmol/L (ref 3.5–5.1)
Sodium: 139 mmol/L (ref 135–145)

## 2018-12-13 LAB — CBC
HCT: 40.8 % (ref 39.0–52.0)
HEMOGLOBIN: 12.1 g/dL — AB (ref 13.0–17.0)
MCH: 22.5 pg — ABNORMAL LOW (ref 26.0–34.0)
MCHC: 29.7 g/dL — ABNORMAL LOW (ref 30.0–36.0)
MCV: 75.8 fL — ABNORMAL LOW (ref 80.0–100.0)
Platelets: 256 10*3/uL (ref 150–400)
RBC: 5.38 MIL/uL (ref 4.22–5.81)
RDW: 16.9 % — ABNORMAL HIGH (ref 11.5–15.5)
WBC: 6.4 10*3/uL (ref 4.0–10.5)
nRBC: 0 % (ref 0.0–0.2)

## 2018-12-13 LAB — PROTIME-INR
INR: 1.01
Prothrombin Time: 13.2 seconds (ref 11.4–15.2)

## 2018-12-13 MED ORDER — CEFAZOLIN SODIUM-DEXTROSE 2-4 GM/100ML-% IV SOLN
INTRAVENOUS | Status: AC
  Filled 2018-12-13: qty 100

## 2018-12-13 MED ORDER — FENTANYL CITRATE (PF) 100 MCG/2ML IJ SOLN
INTRAMUSCULAR | Status: AC
  Filled 2018-12-13: qty 2

## 2018-12-13 MED ORDER — MIDAZOLAM HCL 2 MG/2ML IJ SOLN
INTRAMUSCULAR | Status: AC | PRN
  Administered 2018-12-13 (×2): 1 mg via INTRAVENOUS

## 2018-12-13 MED ORDER — IOPAMIDOL (ISOVUE-300) INJECTION 61%
INTRAVENOUS | Status: AC
  Filled 2018-12-13: qty 50

## 2018-12-13 MED ORDER — SODIUM CHLORIDE 0.9 % IV SOLN: INTRAVENOUS | Status: DC

## 2018-12-13 MED ORDER — MIDAZOLAM HCL 2 MG/2ML IJ SOLN
INTRAMUSCULAR | Status: AC
  Filled 2018-12-13: qty 4

## 2018-12-13 MED ORDER — FENTANYL CITRATE (PF) 100 MCG/2ML IJ SOLN
INTRAMUSCULAR | Status: AC
  Filled 2018-12-13: qty 4

## 2018-12-13 MED ORDER — FENTANYL CITRATE (PF) 100 MCG/2ML IJ SOLN
INTRAMUSCULAR | Status: AC | PRN
  Administered 2018-12-13 (×2): 25 ug via INTRAVENOUS
  Administered 2018-12-13: 50 ug via INTRAVENOUS

## 2018-12-13 MED ORDER — CEFAZOLIN SODIUM-DEXTROSE 2-4 GM/100ML-% IV SOLN
INTRAVENOUS | Status: AC | PRN
  Administered 2018-12-13: 2 g via INTRAVENOUS

## 2018-12-13 MED ORDER — MIDAZOLAM HCL 2 MG/2ML IJ SOLN
INTRAMUSCULAR | Status: AC
  Filled 2018-12-13: qty 2

## 2018-12-13 MED ORDER — LIDOCAINE HCL (PF) 1 % IJ SOLN
INTRAMUSCULAR | Status: AC
  Filled 2018-12-13: qty 30

## 2018-12-13 MED ORDER — LIDOCAINE HCL (PF) 1 % IJ SOLN
INTRAMUSCULAR | Status: DC | PRN
  Administered 2018-12-13: 5 mL

## 2018-12-13 MED ORDER — CEFAZOLIN SODIUM-DEXTROSE 2-4 GM/100ML-% IV SOLN: 2.0000 g | INTRAVENOUS | Status: DC

## 2018-12-13 NOTE — Consult Note (Signed)
Chief Complaint: Patient was seen in consultation today for No chief complaint on file.  at the request of [place]  Referring Physician(s): Dr [name] Dr Vic Blackbird  Supervising Physician: Corrie Mckusick  Patient Status: [facility] - Out-pt  History of Present Illness: [name] is a 73 y.o. male   CVA-- wc and cane use Lung Ca Parotid cancer Lymphoma  Fell at home 12/01/18 Work up revealed L2 fx MRI 12/24:  IMPRESSION: 1. Acute-subacute L1 vertebral body compression fracture. 2. Chronic pathologic compression fracture of the L2 vertebral body unchanged compared with prior CT abdomen dated 06/20/2014.  Referred to IR to discuss KP Now scheduled for L2 Kyphoplasty; possible ablation and biopsy per Dr Pearlie Oyster Dr Rolla Plate to perform today    Past Medical History:  Diagnosis Date  . Arthritis   . Asthma    "hx of"  . Barrett's esophagus    EGD 03/23/2011 & EGD 2/09 bx proven  . BPH (benign prostatic hyperplasia)   . Cancer of parotid gland ([place]) 11/23/12   Adenocarcinoma  . Chronic abdominal pain   . Chronic constipation   . Colon polyp 03/23/2011   tubular adenoma, Dr. Gala Romney  . 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 ([place]) 07/15/2011  . CVA (cerebral infarction) 1998   right sided deficit  . Delayed gastric emptying 2018  . Diverticulosis    TCS 03/23/11 pancolonic diverticula &TCS 5/08, pancolonic diverticula  . DM (diabetes mellitus) ([place])   . Edema of lower extremity 12/21/12   bilateral   . Esotropia of left eye   . GERD (gastroesophageal reflux disease)   . Glaucoma (increased eye pressure)   . Gout   . Gout   . Hemorrhagic colitis 06/06/2012.  [name] Hemorrhoids, internal 03/23/2011   tcs by Dr. Gala Romney  . Hepatitis    esosiniphilic, tx with prednisone  . Hiatal hernia   . History of radiation therapy 05/21/18- 05/30/18   Left Lung/ 54 Gy delivered in 3 fractions of 18 Gy. SBRT  . HTN (hypertension)   . Hx of radiation therapy 1974     right base of skull area-lymphoma  . Hyperlipidemia   . Lower facial weakness    Right  . Lymphoma ([place]) 1974   XRT at [facility], right base of skull area  . Neuropathy   . Peripheral edema    R>L legs  . Rash    chronic, recurrent, R>L legs  . Renal insufficiency   . Steatohepatitis    liver biopsy 2009  . Stroke [facility]) 1998   right hemiparesis/plegia    Past Surgical History:  Procedure Laterality Date  . BIOPSY  12/01/2016   Procedure: BIOPSY;  Surgeon: Daneil Dolin, MD;  Location: [facility] ENDO SUITE;  Service: Endoscopy;;  duodenum, gastric, esophagus  . CHOLECYSTECTOMY    . COLONOSCOPY  03/23/11   Dr. Gala Romney  pancolonic diverticula, hemorrhoids, tubular adenoma.. next tcs 03/2016  . COLONOSCOPY WITH PROPOFOL N/A 12/01/2016   inadequate bowel prep precluded exam  . ESOPHAGOGASTRODUODENOSCOPY  02/05/08   goblet cell metaplasia/negative for H.pylori  . ESOPHAGOGASTRODUODENOSCOPY  03/23/11   Dr. Gala Romney, barretts, hiatal hernia  . ESOPHAGOGASTRODUODENOSCOPY (EGD) WITH PROPOFOL N/A 12/01/2016   Dr. Gala Romney: Large amount of retained gastric contents precluded completion of the stomach. Mucosal changes were found in the stomach. Erosions and somewhat scalloped appearing mucosa present, reactive gastritis/no H pylori. Barrett's esophagus noted, no dysplasia on biopsy. Duodenal biopsies taken as well, benign, no evidence of  eosinophilia.  [name] MASS BIOPSY  11/01/2012   Procedure: NECK MASS BIOPSY;  Surgeon: Ascencion Dike, MD;  Location: [facility] ORS;  Service: ENT;  Laterality: Right;  Excisional Bx Right Neck Mass; attempted external jugular cutdown of left side  . PAROTIDECTOMY  11/24/2012   Procedure: PAROTIDECTOMY;  Surgeon: Ascencion Dike, MD;  Location: [place];  Service: ENT;  Laterality: N/A;  Total parotidectomy  . PLEURECTOMY    . right lymph node removal Right    behind right ear  . Right video-assisted thoracic surgery, pleurectomy, and pleurodesis  2011  . VIDEO BRONCHOSCOPY WITH ENDOBRONCHIAL  NAVIGATION N/A 04/26/2018   Procedure: VIDEO BRONCHOSCOPY WITH ENDOBRONCHIAL NAVIGATION;  Surgeon: Melrose Nakayama, MD;  Location: [facility] OR;  Service: Thoracic;  Laterality: N/A;    Allergies: Patient has no known allergies.  Medications: Prior to Admission medications   Medication Sig Start Date End Date Taking? Authorizing Provider  allopurinol (ZYLOPRIM) 300 MG tablet TAKE 1 TABLET BY MOUTH EVERY DAY 10/23/18  Yes [place], [name], MD  AMITIZA 24 MCG capsule TAKE 1 CAPSULE(24 MCG) BY MOUTH TWICE DAILY WITH A MEAL 05/08/18  Yes Annitta Needs, NP  amLODipine (NORVASC) 10 MG tablet TAKE 1 TABLET BY MOUTH DAILY 12/07/18  Yes [name], MD  cloNIDine (CATAPRES) 0.2 MG tablet TAKE 1 TABLET BY MOUTH THREE TIMES DAILY 09/18/18  Yes [place], [name], MD  doxycycline (VIBRA-TABS) 100 MG tablet Take 1 tablet (100 mg total) by mouth 2 (two) times daily. 11/05/18  Yes [place], [name], MD  famotidine (PEPCID) 20 MG tablet Take 1 tablet (20 mg total) by mouth daily. 07/27/18  Yes Francine Graven, DO  furosemide (LASIX) 40 MG tablet TAKE 1 TABLET BY MOUTH DAILY 09/17/18  Yes [place], [name], MD  gabapentin (NEURONTIN) 300 MG capsule TAKE 2 CAPSULES(600 MG) BY MOUTH TWICE DAILY, MAY TAKE AN ADDITIONAL 600 MG MID DAY AS NEEDED FOR PAIN 09/25/18  Yes [place], [name], MD  Insulin Glargine (LANTUS SOLOSTAR) 100 UNIT/ML Solostar Pen Inject 50 Units into the skin at bedtime. 10/29/18  Yes [place], Marella Chimes, MD  lisinopril (PRINIVIL,ZESTRIL) 20 MG tablet TAKE 1 TABLET BY MOUTH DAILY 10/01/18  Yes Door, [name], MD  metFORMIN (GLUCOPHAGE) 500 MG tablet TAKE 1 TABLET BY MOUTH TWICE DAILY 10/15/18  Yes Nida, Marella Chimes, MD  metoprolol succinate (TOPROL-XL) 50 MG 24 hr tablet TAKE 1 TABLET BY MOUTH DAILY WITH OR IMMEDIATELY FOLLOWING A MEAL 12/07/18  Yes [place], [name], MD  pantoprazole (PROTONIX) 40 MG tablet TAKE 1 TABLET BY MOUTH DAILY 10/23/18  Yes [place], [name], MD  potassium  chloride SA (K-DUR,KLOR-CON) 20 MEQ tablet TAKE 1 TABLET(20 MEQ) BY MOUTH DAILY 10/29/18  Yes [place], [name], MD  simvastatin (ZOCOR) 20 MG tablet TAKE 1 TABLET BY MOUTH EVERY NIGHT AT BEDTIME 11/26/18  Yes [place], [name], MD  tamsulosin (FLOMAX) 0.4 MG CAPS capsule TAKE 1 CAPSULE BY MOUTH DAILY 10/02/18  Yes , [name], MD  [facility] DELICA LANCETS FINE MISC USE AS DIRECTED FOUR TIMES DAILY 11/14/17   Cassandria Anger, MD  [facility] VERIO test strip TEST FOUR TIMES DAILY 10/29/18   Cassandria Anger, MD  oxyCODONE (ROXICODONE) 5 MG immediate release tablet Take 1 tablet (5 mg total) by mouth every 4 (four) hours as needed for breakthrough pain. 12/01/18   Evalee Jefferson, PA-C  oxyCODONE-acetaminophen (PERCOCET) 7.5-325 MG tablet Take 1 tablet by mouth every 4 (four) hours as needed. 11/05/18  [place], [name], MD  tiZANidine (ZANAFLEX) 2 MG tablet TAKE 1 TABLET(2 MG) BY MOUTH TWICE DAILY AS NEEDED 11/26/18   [name], MD  Triamcinolone Acetonide (TRIAMCINOLONE 0.1 % CREAM : EUCERIN) CREA Apply 1 application topically 2 (two) times daily as needed. Patient taking differently: Apply 1 application topically 2 (two) times daily as needed for rash.  02/06/18   [name], MD     Family History  Problem Relation Age of Onset  . Heart failure Mother   . Heart failure Father   . Heart failure Sister   . Heart failure Son     Social History   Socioeconomic History  . Marital status: Married    Spouse name: Not on file  . Number of children: 1  . Years of education: Not on file  . Highest education level: Not on file  Occupational History  . Occupation: disabled  Social Needs  . Financial resource strain: Not on file  . Food insecurity:    Worry: Not on file    Inability: Not on file  . Transportation needs:    Medical: Not on file    Non-medical: Not on file  Tobacco Use  . Smoking status: Former Smoker    Packs/day: 3.00    Years: 25.00    Pack  years: 75.00    Types: Cigarettes    Last attempt to quit: 03/01/1997    Years since quitting: 21.8  . Smokeless tobacco: Never Used  Substance and Sexual Activity  . Alcohol use: No  . Drug use: No  . Sexual activity: Not Currently  Lifestyle  . Physical activity:    Days per week: Not on file    Minutes per session: Not on file  . Stress: Not on file  Relationships  . Social connections:    Talks on phone: Not on file    Gets together: Not on file    Attends religious service: Not on file    Active member of club or organization: Not on file    Attends meetings of clubs or organizations: Not on file    Relationship status: Not on file  Other Topics Concern  . Not on file  Social History Narrative  . Not on file     Review of Systems: A 12 point ROS discussed and pertinent positives are indicated in the HPI above.  All other systems are negative.  Review of Systems  Constitutional: Positive for activity change and fatigue.  Respiratory: Negative for shortness of breath.   Cardiovascular: Negative for chest pain.  Gastrointestinal: Negative for abdominal pain.  Musculoskeletal: Positive for back pain and gait problem.  Neurological: Positive for weakness.  Psychiatric/Behavioral: Positive for confusion. Negative for behavioral problems.    Vital Signs: Pulse 96   Temp 97.8 F (36.6 C)   Resp 18   Ht 6' (1.829 m)   SpO2 96%   BMI 36.21 kg/m   Physical Exam Vitals signs reviewed.  Cardiovascular:     Rate and Rhythm: Normal rate and regular rhythm.  Pulmonary:     Effort: Pulmonary effort is normal.     Breath sounds: Normal breath sounds.  Abdominal:     General: Bowel sounds are normal.     Palpations: Abdomen is soft.  Musculoskeletal:     Comments: Pt is confused Difficult to assess movement  Skin:    General: Skin is warm and dry.  Psychiatric:     Comments: Consented with wife at  bedside     Imaging: Ct Head Wo Contrast  Result Date:  12/01/2018 CLINICAL DATA:  [place] in the home today. EXAM: CT HEAD WITHOUT CONTRAST CT CERVICAL SPINE WITHOUT CONTRAST TECHNIQUE: Multidetector CT imaging of the head and cervical spine was performed following the standard protocol without intravenous contrast. Multiplanar CT image reconstructions of the cervical spine were also generated. COMPARISON:  MRI 04/02/2018.  CT 02/28/2018 FINDINGS: CT HEAD FINDINGS Brain: Old infarction in the left basal ganglia and deep white matter tracts with atrophy, encephalomalacia and gliosis. No sign of acute infarction, mass lesion, hemorrhage, hydrocephalus or extra-axial collection. Pronounced wall air in degeneration of the brainstem on the left. Vascular: There is atherosclerotic calcification of the major vessels at the base of the brain. Skull: Negative Sinuses/Orbits: No significant sinus inflammatory disease. Other: Chronic thickening the bone at the junction of the anterior wall maxillary sinus and nasal bones on the left, possibly due to a hemangioma or some focal fibrous dysplasia. CT CERVICAL SPINE FINDINGS Alignment: Mild kyphotic curvature in the cervical region which is chronic. 2 mm degenerative anterolisthesis C7-T1. Skull base and vertebrae: No fracture or acute finding. Chronic fusion at C3-4 and from C5 through C7. Soft tissues and spinal canal: Negative Disc levels: Foramen magnum is sufficiently patent. There is osteoarthritis at the C1-2 articulation but no encroachment upon the neural structures. C2-3: Facet osteoarthritis right worse than left. No compressive stenosis. C3-4: Chronic fusion.  No stenosis. C4-5: Disc degeneration with endplate osteophytes and bulging of the disc. Narrowing of the canal with AP diameter as narrow as 7.5 mm. Bilateral bony foraminal narrowing. C5 through C7: Distant fusion. Sufficient patency of the canal and foramina. C7-T1: Chronic facet arthropathy with 2 mm of anterolisthesis. Chronic disc degeneration with loss of disc  height and endplate sclerosis. No central canal stenosis. Bilateral foraminal narrowing that could affect either C8 nerve. Upper chest: Emphysema and scarring. Other: None IMPRESSION: Head CT: No acute or traumatic finding. Old infarction in the left basal ganglia and radiating white matter tracts. Cervical spine CT: No acute or traumatic finding. Extensive and chronic degenerative changes as outlined above. Potential for significant canal and foraminal stenosis at C4-5. Potential for significant foraminal stenosis at C7-T1. Electronically Signed   By: Nelson Chimes M.D.   On: 12/01/2018 13:43   Ct Cervical Spine Wo Contrast  Result Date: 12/01/2018 CLINICAL DATA:  [place] in the home today. EXAM: CT HEAD WITHOUT CONTRAST CT CERVICAL SPINE WITHOUT CONTRAST TECHNIQUE: Multidetector CT imaging of the head and cervical spine was performed following the standard protocol without intravenous contrast. Multiplanar CT image reconstructions of the cervical spine were also generated. COMPARISON:  MRI 04/02/2018.  CT 02/28/2018 FINDINGS: CT HEAD FINDINGS Brain: Old infarction in the left basal ganglia and deep white matter tracts with atrophy, encephalomalacia and gliosis. No sign of acute infarction, mass lesion, hemorrhage, hydrocephalus or extra-axial collection. Pronounced wall air in degeneration of the brainstem on the left. Vascular: There is atherosclerotic calcification of the major vessels at the base of the brain. Skull: Negative Sinuses/Orbits: No significant sinus inflammatory disease. Other: Chronic thickening the bone at the junction of the anterior wall maxillary sinus and nasal bones on the left, possibly due to a hemangioma or some focal fibrous dysplasia. CT CERVICAL SPINE FINDINGS Alignment: Mild kyphotic curvature in the cervical region which is chronic. 2 mm degenerative anterolisthesis C7-T1. Skull base and vertebrae: No fracture or acute finding. Chronic fusion at C3-4 and from C5 through C7. Soft  tissues  and spinal canal: Negative Disc levels: Foramen magnum is sufficiently patent. There is osteoarthritis at the C1-2 articulation but no encroachment upon the neural structures. C2-3: Facet osteoarthritis right worse than left. No compressive stenosis. C3-4: Chronic fusion.  No stenosis. C4-5: Disc degeneration with endplate osteophytes and bulging of the disc. Narrowing of the canal with AP diameter as narrow as 7.5 mm. Bilateral bony foraminal narrowing. C5 through C7: Distant fusion. Sufficient patency of the canal and foramina. C7-T1: Chronic facet arthropathy with 2 mm of anterolisthesis. Chronic disc degeneration with loss of disc height and endplate sclerosis. No central canal stenosis. Bilateral foraminal narrowing that could affect either C8 nerve. Upper chest: Emphysema and scarring. Other: None IMPRESSION: Head CT: No acute or traumatic finding. Old infarction in the left basal ganglia and radiating white matter tracts. Cervical spine CT: No acute or traumatic finding. Extensive and chronic degenerative changes as outlined above. Potential for significant canal and foraminal stenosis at C4-5. Potential for significant foraminal stenosis at C7-T1. Electronically Signed   By: Nelson Chimes M.D.   On: 12/01/2018 13:43   Mr Lumbar Spine Wo Contrast  Result Date: 12/04/2018 CLINICAL DATA:  Status post fall, severe back pain EXAM: MRI LUMBAR SPINE WITHOUT CONTRAST TECHNIQUE: Multiplanar, multisequence MR imaging of the lumbar spine was performed. No intravenous contrast was administered. COMPARISON:  CT abdomen 06/20/2014, PET-CT 08/30/2018, CT abdomen 05/23/2016 FINDINGS: Segmentation:  Standard. Alignment:  Physiologic. Vertebrae: No discitis or osteomyelitis. L1 vertebral body compression fracture with approximately 20% anterior height loss and marrow edema along the superior endplate most concerning for an acute versus subacute compression fracture. Abnormal heterogeneous T1 and T2 marrow signal  throughout L2 vertebral body with approximately 20% anterior height loss most concerning for a pathologic compression fracture. Marrow heterogeneity is present and although this can be caused by marrow infiltrative processes, the most common causes include anemia, smoking, obesity, or advancing age. Conus medullaris and cauda equina: Conus extends to the T12 level. Conus and cauda equina appear normal. Paraspinal and other soft tissues: No acute paraspinal abnormality. Disc levels: Disc spaces: Degenerative disc disease with disc height loss at L4-5. T12-L1: No significant disc bulge. No evidence of neural foraminal stenosis. No central canal stenosis. L1-L2: No significant disc bulge. No evidence of neural foraminal stenosis. No central canal stenosis. L2-L3: No significant disc bulge. No evidence of neural foraminal stenosis. No central canal stenosis. L3-L4: No significant disc bulge with a shallow right foraminal disc protrusion. Moderate bilateral facet arthropathy with bilateral facet effusions. No evidence of neural foraminal stenosis. No central canal stenosis. L4-L5: Broad-based disc bulge. Mild bilateral facet arthropathy. No evidence of neural foraminal stenosis. No central canal stenosis. L5-S1: No significant disc bulge. No evidence of neural foraminal stenosis. No central canal stenosis. IMPRESSION: 1. Acute-subacute L1 vertebral body compression fracture. 2. Chronic pathologic compression fracture of the L2 vertebral body unchanged compared with prior CT abdomen dated 06/20/2014. 3. Lumbar spine spondylosis as described above. Electronically Signed   By: Kathreen Devoid   On: 12/04/2018 13:40   Ct Abdomen Pelvis W Contrast  Result Date: 12/01/2018 CLINICAL DATA:  Fall this morning. Blunt abdominal trauma. Abdominal pain. Initial encounter. EXAM: CT ABDOMEN AND PELVIS WITH CONTRAST TECHNIQUE: Multidetector CT imaging of the abdomen and pelvis was performed using the standard protocol following bolus  administration of intravenous contrast. CONTRAST:  152m ISOVUE-300 IOPAMIDOL (ISOVUE-300) INJECTION 61% COMPARISON:  Noncontrast CT on 07/27/2018 FINDINGS: Lower chest: No acute findings. Bilateral lower lobe bronchiectasis again noted. Hepatobiliary:  No hepatic laceration or mass identified. Tiny sub-cm cysts remain stable. Prior cholecystectomy. No evidence of biliary obstruction. Pancreas: No parenchymal laceration, mass, or inflammatory changes identified. Spleen: No evidence of splenic laceration. Adrenal/Urinary Tract: No hemorrhage or parenchymal lacerations identified. Multiple small renal cysts are again seen bilaterally. No evidence of mass or hydronephrosis. Unremarkable unopacified urinary bladder. Stomach/Bowel: No evidence of hemoperitoneum. Colonic diverticulosis again seen, without evidence of diverticulitis. Normal appendix visualized. Vascular/Lymphatic: No evidence of abdominal aortic injury. Aortic atherosclerosis. No pathologically enlarged lymph nodes identified. Reproductive:  No mass or other significant abnormality identified. Other:  None. Musculoskeletal: No acute fractures or suspicious bone lesions identified. IMPRESSION: No evidence of visceral injury or other acute findings. Colonic diverticulosis, without radiographic evidence of diverticulitis. Electronically Signed   By: Earle Gell M.D.   On: 12/01/2018 13:57   Ct L-spine No Charge  Result Date: 12/01/2018 CLINICAL DATA:  [place] in the home today.  Back pain. EXAM: CT LUMBAR SPINE WITHOUT CONTRAST TECHNIQUE: Multidetector CT imaging of the lumbar spine was performed without intravenous contrast administration. Multiplanar CT image reconstructions were also generated. COMPARISON:  Previous abdominal CT scans as distant as 06/20/2014 FINDINGS: Segmentation: 5 lumbar type vertebral bodies.  S1 is transitional. Alignment: Normal Vertebrae: Acute superior endplate fracture at L2 with loss of height of 20%. No retropulsed bone.  Chronic lytic and sclerotic changes of the L3 vertebral body, unchanged since 2015 and most consistent with previous treated disease. No acute exacerbation at this level. Chronic degenerative endplate changes at P3-X9. Paraspinal and other soft tissues: See results of abdominal CT. Disc levels: No canal or foraminal stenosis. Mild facet osteoarthritis at L3-4, L4-5 and L5-S1. Chronic disc degeneration at L5-S1 but without disc herniation or neural compressive stenosis. IMPRESSION: Acute superior endplate fracture at L2 with loss of height of 20%. No retropulsed bone. Chronic lytic and sclerotic changes of L3, unchanged since 2015. No evidence of traumatic exacerbate shin. This is probably a site of previously treated disease. Electronically Signed   By: Nelson Chimes M.D.   On: 12/01/2018 13:56    Labs:  CBC: Recent Labs    04/13/18 1018 06/26/18 1516 07/27/18 0846 12/01/18 1103  WBC 8.2 10.8 7.6 10.2  HGB 12.7* 11.7* 13.2 13.4  HCT 40.4 36.9* 41.7 44.9  PLT 194 267 244 214    COAGS: Recent Labs    04/13/18 1018  INR 0.97  APTT 31    BMP: Recent Labs    07/19/18 0911 07/27/18 0846 10/25/18 0935 12/01/18 1103  NA 139 140 141 140  K 4.3 4.4 4.5 4.2  CL 106 104 105 108  CO2 24 26 25 24   GLUCOSE 89 148* 66 106*  BUN 20 19 15 20   CALCIUM 10.4* 10.8* 10.6*  10.6* 10.5*  CREATININE 1.35* 1.30* 1.34* 1.31*  GFRNONAA 52* 54* 53* 54*  GFRAA 61 >60 61 >60    LIVER FUNCTION TESTS: Recent Labs    04/13/18 1018 07/19/18 0911 07/27/18 0846 10/25/18 0935 12/01/18 1103  BILITOT 0.2* 0.2 0.7 0.3 0.2*  AST 15 10 15 11 18   ALT 16* 9 14 9 15   ALKPHOS 76  --  79  --  72  PROT 7.2 6.9 8.0 7.2 7.5  ALBUMIN 3.5  --  3.9  --  3.8    TUMOR MARKERS: No results for input(s): AFPTM, CEA, CA199, CHROMGRNA in the last 8760 hours.  Assessment and Plan:  L2 acute painful fracture Hx Lung and parotid cancer; Hx Lymphoma Scheduled  for Kyphoplasty/possible biopsy and ablation Risks  and benefits of L2 KP were discussed with the patient including, but not limited to education regarding the natural healing process of compression fractures without intervention, bleeding, infection, cement migration which may cause spinal cord damage, paralysis, pulmonary embolism or even death.  This interventional procedure involves the use of X-rays and because of the nature of the planned procedure, it is possible that we will have prolonged use of X-ray fluoroscopy.  Potential radiation risks to you include (but are not limited to) the following: - A slightly elevated risk for cancer  several years later in life. This risk is typically less than 0.5% percent. This risk is low in comparison to the normal incidence of human cancer, which is 33% for women and 50% for men according to the [place]. - Radiation induced injury can include skin redness, resembling a rash, tissue breakdown / ulcers and hair loss (which can be temporary or permanent).   The likelihood of either of these occurring depends on the difficulty of the procedure and whether you are sensitive to radiation due to previous procedures, disease, or genetic conditions.   IF your procedure requires a prolonged use of radiation, you will be notified and given written instructions for further action.  It is your responsibility to monitor the irradiated area for the 2 weeks following the procedure and to notify your physician if you are concerned that you have suffered a radiation induced injury.    All of the patient's questions were answered, patient is agreeable to proceed.  Consent signed and in chart.  Thank you for this interesting consult.  I greatly enjoyed meeting [name] and look forward to participating in their care.  A copy of this report was sent to the requesting provider on this date.  Electronically Signed: Lavonia Drafts, PA-C 12/13/2018, 9:59 AM   I spent a total of  30 Minutes   in face  to face in clinical consultation, greater than 50% of which was counseling/coordinating care for L2 Kyphoplasty

## 2018-12-13 NOTE — Procedures (Signed)
Interventional Radiology Procedure Note  Procedure: Image guided unipedicular kyphoplasty of L2 fracture level via the right.  Biopsy performed Image guided biopsy of the L3 vertebral body.   Complications: None  Recommendations:  - Supine x 3 hours, then dc home - follow up pathology - routine wound care - follow up in clinic with Dr Earleen Newport in 4-8 weeks - dc in 3 hours   Signed,  Dulcy Fanny. Earleen Newport, DO

## 2018-12-13 NOTE — Discharge Instructions (Signed)
KYPHOPLASTY/VERTEBROPLASTY DISCHARGE INSTRUCTIONS  Medications: (check all that apply)     Resume all home medications as before procedure.                    Continue your pain medications as prescribed as needed.  Over the next 3-5 days, decrease your pain medication as tolerated.  Over the counter medications (i.e. Tylenol, ibuprofen, and aleve) may be substituted once severe/moderate pain symptoms have subsided.   Wound Care: - Bandages may be removed the day following your procedure.  You may get your incision wet once bandages are removed.  Bandaids may be used to cover the incisions until scab formation.  Topical ointments are optional.  - If you develop a fever greater than 101 degrees, have increased skin redness at the incision sites or pus-like oozing from incisions occurring within 1 week of the procedure, contact radiology at [phone] or [phone].  - Ice pack to back for 15-20 minutes 2-3 time per day for first 2-3 days post procedure.  The ice will expedite muscle healing and help with the pain from the incisions.   Activity: - Bedrest today with limited activity for 24 hours post procedure.  - No driving for 48 hours.  - Increase your activity as tolerated after bedrest (with assistance if necessary).  - Refrain from any strenuous activity or heavy lifting (greater than 10 lbs.).   Follow up: - Contact radiology at [phone] or [phone] if any questions/concerns.  - A physician assistant from radiology will contact you in approximately 1 week.  - If a biopsy was performed at the time of your procedure, your referring physician should receive the results in usually 2-3 days.

## 2018-12-18 ENCOUNTER — Telehealth: Payer: Self-pay | Admitting: *Deleted

## 2018-12-18 NOTE — Telephone Encounter (Signed)
Received call from patient wife, [name].   Inquired as to if MD had received chart notes from Dr. Jacqualyn Posey.   MD please advise.

## 2018-12-18 NOTE — Telephone Encounter (Signed)
I have a note from him for the Kyphoplasty was there a question or concern?

## 2018-12-20 NOTE — Telephone Encounter (Signed)
Call placed to patient wife. States that she was concerned because area on L3 was removed from biopsy, but she has not heard from them in regards to the results.   Advised to contact surgeon office to discuss.

## 2018-12-26 ENCOUNTER — Other Ambulatory Visit: Payer: Self-pay | Admitting: Family Medicine

## 2018-12-26 ENCOUNTER — Telehealth: Payer: Self-pay

## 2018-12-26 NOTE — Telephone Encounter (Signed)
Mrs. Butler called concerning the biopsy her husband recently had of his lumbar bone. Dr. Isidore Moos asked that I inform her that the biopsy was negative for cancer. I called her back and informed her of the above. She voiced her appreciation and knows to call me if she has any further concerns or questions.

## 2018-12-29 ENCOUNTER — Other Ambulatory Visit: Payer: Self-pay | Admitting: Family Medicine

## 2018-12-30 ENCOUNTER — Other Ambulatory Visit: Payer: Self-pay | Admitting: Family Medicine

## 2018-12-31 ENCOUNTER — Other Ambulatory Visit: Payer: Self-pay | Admitting: Family Medicine

## 2019-01-02 ENCOUNTER — Other Ambulatory Visit: Payer: Self-pay | Admitting: Interventional Radiology

## 2019-01-02 DIAGNOSIS — S32020A Wedge compression fracture of second lumbar vertebra, initial encounter for closed fracture: Secondary | ICD-10-CM

## 2019-01-03 NOTE — Progress Notes (Signed)
[name] presents for follow up of radiation completed 05/30/18 to his left lung. He is here for results of a CT chest completed. CT completed on 01/07/2019. Patient denies SOB, and cough.  No difficulty eating at this time.  BP (!) 141/89 (BP Location: Left Arm, Patient Position: Sitting)   Pulse 85   Temp 98.2 F (36.8 C) (Oral)   Resp 20   SpO2 100%    Wt Readings from Last 3 Encounters:  12/01/18 266 lb 15.6 oz (121.1 kg)  07/27/18 266 lb 15.6 oz (121.1 kg)  04/18/18 267 lb (121.1 kg)

## 2019-01-04 ENCOUNTER — Telehealth: Payer: Self-pay | Admitting: *Deleted

## 2019-01-04 NOTE — Telephone Encounter (Signed)
CALLED PATIENT TO INFORM OF CT SCAN FOR 01-07-19 - ARRIVAL TIME - 8:15 AM @ [facility], NO RESTRICTIONS TO TEST, PT. TO GET RESULTS FROM DR. SQUIRE ON 01/08/19, LVM FOR A RETURN CALL

## 2019-01-07 ENCOUNTER — Ambulatory Visit (HOSPITAL_COMMUNITY)
Admission: RE | Admit: 2019-01-07 | Discharge: 2019-01-07 | Disposition: A | Discharge status: Home / Self Care | Payer: Medicare Other | Source: Ambulatory Visit | Attending: Radiation Oncology | Admitting: Radiation Oncology

## 2019-01-07 ENCOUNTER — Other Ambulatory Visit: Payer: Self-pay | Admitting: Family Medicine

## 2019-01-07 DIAGNOSIS — J439 Emphysema, unspecified: Secondary | ICD-10-CM | POA: Diagnosis not present

## 2019-01-07 DIAGNOSIS — I6789 Other cerebrovascular disease: Secondary | ICD-10-CM | POA: Diagnosis not present

## 2019-01-07 DIAGNOSIS — I6350 Cerebral infarction due to unspecified occlusion or stenosis of unspecified cerebral artery: Secondary | ICD-10-CM | POA: Diagnosis not present

## 2019-01-07 DIAGNOSIS — C3412 Malignant neoplasm of upper lobe, left bronchus or lung: Secondary | ICD-10-CM | POA: Diagnosis not present

## 2019-01-08 ENCOUNTER — Other Ambulatory Visit: Payer: Self-pay

## 2019-01-08 ENCOUNTER — Ambulatory Visit
Admission: RE | Admit: 2019-01-08 | Discharge: 2019-01-08 | Disposition: A | Discharge status: Home / Self Care | Payer: Medicare Other | Source: Ambulatory Visit | Attending: Radiation Oncology | Admitting: Radiation Oncology

## 2019-01-08 DIAGNOSIS — I712 Thoracic aortic aneurysm, without rupture: Secondary | ICD-10-CM | POA: Diagnosis not present

## 2019-01-08 DIAGNOSIS — Z79899 Other long term (current) drug therapy: Secondary | ICD-10-CM | POA: Insufficient documentation

## 2019-01-08 DIAGNOSIS — C3412 Malignant neoplasm of upper lobe, left bronchus or lung: Secondary | ICD-10-CM

## 2019-01-08 DIAGNOSIS — Z794 Long term (current) use of insulin: Secondary | ICD-10-CM | POA: Insufficient documentation

## 2019-01-08 DIAGNOSIS — Z08 Encounter for follow-up examination after completed treatment for malignant neoplasm: Secondary | ICD-10-CM | POA: Diagnosis not present

## 2019-01-08 DIAGNOSIS — Z85118 Personal history of other malignant neoplasm of bronchus and lung: Secondary | ICD-10-CM | POA: Insufficient documentation

## 2019-01-08 DIAGNOSIS — Z8781 Personal history of (healed) traumatic fracture: Secondary | ICD-10-CM | POA: Diagnosis not present

## 2019-01-08 DIAGNOSIS — J439 Emphysema, unspecified: Secondary | ICD-10-CM | POA: Insufficient documentation

## 2019-01-08 DIAGNOSIS — I700 Atherosclerosis of aorta: Secondary | ICD-10-CM | POA: Diagnosis not present

## 2019-01-08 NOTE — Progress Notes (Signed)
Radiation Oncology         [phone] ________________________________  Name: [name] MRN: [id number]  Date: 01/08/2019  DOB: [birth year]  Follow-Up Visit Note  Outpatient  CC: Kings, [name], MD  [name], MD  Diagnosis and Prior Radiotherapy:    ICD-10-CM   1. Malignant neoplasm of upper lobe of left lung ([place]) C34.12      Non small cell lung cancer - adenocarcinoma Cancer Staging Malignant neoplasm of upper lobe of left lung ([place]) Staging form: Lung, AJCC 8th Edition - Clinical: Stage IB (cT2a, cN0, cM0) - Signed by [name], MD on 04/06/2018  05/21/2018 - 05/30/2018: Lung, Left/ 54 Gy delivered in 3 fractions of 18 Gy  CHIEF COMPLAINT: Here for follow-up and surveillance of lung cancer  Narrative:  The patient returns today for routine follow-up of radiation completed 05/30/2018 to his left lung in 3 fractions. He is accompanied by his wife today. He reports feeling well overall.  He underwent follow up chest CT scan on 01/07/2019, which showed no evidence of recurrence or metastatic disease.  Since his last visit, he fell on 12/01/2018 which resulted in an L2 compression fracture. A questionable pathologic lesion was noted at L3. This lesion was biopsied on 12/13/2018 and revealed no metastatic carcinoma.   ALLERGIES:  has No Known Allergies.  Meds: Current Outpatient Medications  Medication Sig Dispense Refill  . allopurinol (ZYLOPRIM) 300 MG tablet TAKE 1 TABLET BY MOUTH EVERY DAY 90 tablet 0  . AMITIZA 24 MCG capsule TAKE 1 CAPSULE(24 MCG) BY MOUTH TWICE DAILY WITH A MEAL (Patient taking differently: daily. ) 60 capsule 3  . amLODipine (NORVASC) 10 MG tablet TAKE 1 TABLET BY MOUTH DAILY 90 tablet 0  . cloNIDine (CATAPRES) 0.2 MG tablet TAKE 1 TABLET BY MOUTH THREE TIMES DAILY 270 tablet 0  . doxycycline (VIBRA-TABS) 100 MG tablet Take 1 tablet (100 mg total) by mouth 2 (two) times daily. 14 tablet 0  . famotidine (PEPCID) 20 MG tablet Take 1 tablet  (20 mg total) by mouth daily. 15 tablet 0  . furosemide (LASIX) 40 MG tablet TAKE 1 TABLET BY MOUTH DAILY 90 tablet 0  . gabapentin (NEURONTIN) 300 MG capsule TAKE 2 CAPSULES(600 MG) BY MOUTH TWICE DAILY, MAY TAKE AN ADDITIONAL 600 MG MID DAY AS NEEDED FOR PAIN 540 capsule 0  . Insulin Glargine (LANTUS SOLOSTAR) 100 UNIT/ML Solostar Pen Inject 50 Units into the skin at bedtime. 15 mL 2  . lisinopril (PRINIVIL,ZESTRIL) 20 MG tablet TAKE 1 TABLET BY MOUTH DAILY 90 tablet 0  . metFORMIN (GLUCOPHAGE) 500 MG tablet TAKE 1 TABLET BY MOUTH TWICE DAILY 180 tablet 0  . metoprolol succinate (TOPROL-XL) 50 MG 24 hr tablet TAKE 1 TABLET BY MOUTH DAILY WITH OR IMMEDIATELY FOLLOWING A MEAL 90 tablet 0  . ONETOUCH DELICA LANCETS FINE MISC USE AS DIRECTED FOUR TIMES DAILY 200 each 2  . ONETOUCH VERIO test strip TEST FOUR TIMES DAILY 150 each 5  . oxyCODONE (ROXICODONE) 5 MG immediate release tablet Take 1 tablet (5 mg total) by mouth every 4 (four) hours as needed for breakthrough pain. 20 tablet 0  . oxyCODONE-acetaminophen (PERCOCET) 7.5-325 MG tablet Take 1 tablet by mouth every 4 (four) hours as needed. 180 tablet 0  . pantoprazole (PROTONIX) 40 MG tablet TAKE 1 TABLET BY MOUTH DAILY 90 tablet 0  . potassium chloride SA (K-DUR,KLOR-CON) 20 MEQ tablet TAKE 1 TABLET(20 MEQ) BY MOUTH DAILY 90 tablet 1  . simvastatin (ZOCOR)  20 MG tablet TAKE 1 TABLET BY MOUTH EVERY NIGHT AT BEDTIME 90 tablet 0  . tamsulosin (FLOMAX) 0.4 MG CAPS capsule TAKE 1 CAPSULE BY MOUTH DAILY 90 capsule 0  . tiZANidine (ZANAFLEX) 2 MG tablet TAKE 1 TABLET(2 MG) BY MOUTH TWICE DAILY AS NEEDED 180 tablet 2  . Triamcinolone Acetonide (TRIAMCINOLONE 0.1 % CREAM : EUCERIN) CREA Apply 1 application topically 2 (two) times daily as needed. (Patient taking differently: Apply 1 application topically 2 (two) times daily as needed for rash. ) 1 each 2   No current facility-administered medications for this encounter.     Physical Findings:    Patient in wheelchair.   oral temperature is 98.2 F (36.8 C). His blood pressure is 141/89 (abnormal) and his pulse is 85. His respiration is 20 and oxygen saturation is 100%.    General: in no acute distress HEENT: Head is normocephalic.   Neck: Neck is supple, no palpable cervical or supraclavicular lymphadenopathy. Heart: Systolic murmur in the aortic valve region. Regular in rate and rhythm with no rubs or gallops.  Chest: Clear to auscultation bilaterally, with no rhonchi, wheezes, or rales. Lymphatics: see Neck Exam Psychiatric:  Affect is appropriate.   Lab Findings: Lab Results  Component Value Date   WBC 6.4 12/13/2018   HGB 12.1 (L) 12/13/2018   HCT 40.8 12/13/2018   MCV 75.8 (L) 12/13/2018   PLT 256 12/13/2018    Radiographic Findings: Ct Chest Wo Contrast  Result Date: 01/07/2019 CLINICAL DATA:  Non-small cell lung cancer, completed radiation therapy in June. EXAM: CT CHEST WITHOUT CONTRAST TECHNIQUE: Multidetector CT imaging of the chest was performed following the standard protocol without IV contrast. COMPARISON:  PET 08/30/2018 and CT chest 04/11/2018. FINDINGS: Cardiovascular: Atherosclerotic calcification of the aorta, aortic valve and coronary arteries. Ascending aorta measures 4.3 cm (coronal series 6, image 63). Heart is at the upper limits of normal in size. No pericardial effusion. Mediastinum/Nodes: No pathologically enlarged mediastinal or axillary lymph nodes. Hilar regions are difficult to evaluate without IV contrast. Esophagus is grossly unremarkable. Lungs/Pleura: Bullous emphysema. Postoperative scarring in the right upper lobe. Patchy consolidation and architectural distortion in the left upper lobe are similar to the prior exam and indicative of radiation therapy. Trace loculated left pleural fluid. Postoperative changes in the lingula. Lungs are otherwise clear. Airway is otherwise unremarkable. Upper Abdomen: Visualized portion of the liver is  unremarkable. Slight adrenal thickening, chronic. Visualized portion of the right kidney is unremarkable. 4.2 cm low-attenuation lesion in the left kidney is incompletely visualized. Visualized portions of the spleen, pancreas, stomach and bowel are grossly unremarkable. No upper abdominal adenopathy. Musculoskeletal: Degenerative changes in the spine and shoulders. Thoracotomy changes on the right. Lumbar vertebral body augmentation. No worrisome lytic or sclerotic lesions. Probable sebaceous cyst overlies the midthoracic spine. IMPRESSION: 1. Postoperative and post radiation scarring in the hemi thoraces bilaterally. No evidence of metastatic disease. 2. Ascending Aortic aneurysm NOS (ICD10-I71.9). Recommend annual imaging followup by CTA or MRA. This recommendation follows 2010 ACCF/AHA/AATS/ACR/ASA/SCA/SCAI/SIR/STS/SVM Guidelines for the Diagnosis and Management of Patients with Thoracic Aortic Disease. Circulation. 2010; 121: W413-K440. Aortic aneurysm NOS (ICD10-I71.9). 3. Aortic atherosclerosis (ICD10-170.0). Coronary artery calcification. 4.  Emphysema (ICD10-J43.9). Electronically Signed   By: Lorin Picket M.D.   On: 01/07/2019 09:31   Ir US Guide Vasc Access Left  Result Date: 12/13/2018 INDICATION: 73 year old male presents for treatment of symptomatic compression fracture EXAM: ULTRASOUND-GUIDED VENA PUNCTURE MEDICATIONS: NONE ANESTHESIA/SEDATION: NONE FLUOROSCOPY TIME:  None COMPLICATIONS: None PROCEDURE: Informed  written consent was obtained from the patient after a thorough discussion of the procedural risks, benefits and alternatives. All questions were addressed. Sterile Barrier Technique was utilized including caps, mask, sterile gloves, sterile drape, hand hygiene and skin antiseptic. A timeout was performed prior to the initiation of the procedure. Ultrasound survey of the upper extremity was performed with images stored and sent to PACs. A micropuncture needle was used access the left  brachial vein under ultrasound. With venous blood flow returned, an .018 micro wire was passed through the needle into the vein. The needle was removed, and a micropuncture sheath was placed over the wire. The inner dilator and wire were removed, and the catheter was secured in position after attaching to saline flush. Patient tolerated the procedure well and remained hemodynamically stable throughout. No complications were encountered and no significant blood loss. IMPRESSION: Status post ultrasound-guided IV access. Signed, Dulcy Fanny. Dellia Nims, RPVI [facility] Rad[facility] Electronically Signed   By: Corrie Mckusick D.O.   On: 12/13/2018 15:12   Ir Fluoro Guide Ndl Plmt / Bx  Result Date: 12/13/2018 INDICATION: 73 year old male with a history of symptomatic compression fracture of the L2 level. Note that the patient has 6 non-rib-bearing lumbar type vertebral bodies and the acute fracture is at L2 with a questionable pathologic lesion at L3. The patient has not had significant symptoms before recent fall with the new L2 compression fracture. Today we will proceed with treatment of the L2 fracture and biopsy of the L3 level. EXAM: IR KYPHO VERTEBRAL LUMBAR AUGMENTATION; IR FLUORO GUIDE NEEDLE PLACEMENT /BIOPSY COMPARISON:  MRI 12/04/2018, CT 12/01/2018 MEDICATIONS: As antibiotic prophylaxis, 2 g Ancef was ordered pre-procedure and administered intravenously within 1 hour of incision. ANESTHESIA/SEDATION: Moderate (conscious) sedation was employed during this procedure. A total of Versed 2.0 mg and Fentanyl 100 mcg was administered intravenously. Moderate Sedation Time: 25 minutes. The patient's level of consciousness and vital signs were monitored continuously by radiology nursing throughout the procedure under my direct supervision. FLUOROSCOPY TIME:  Fluoroscopy Time: 4 minutes 30 seconds (3,536 mGy) COMPLICATIONS: None PROCEDURE: Following a full explanation of the  procedure along with the potentially associated complications, a witnessed informed consent was obtained. Specific risks that were discussed included bleeding, infection, injury to adjacent structures, neurologic injury, embolization of cement within the veins, failure of the procedure to improve pain, need for further procedure/ surgery, cardiopulmonary collapse, death. The patient understands the risks and wishes to proceed. The patient was placed prone on the fluoroscopic table. Nasal oxygen was administered. Physiologic monitoring was performed throughout the duration of the procedure. The skin overlying the L2 region was prepped and draped in the usual sterile fashion. The L2 vertebral body was identified and the right pedicle was infiltrated with 1% lidocaine. This was then followed by the advancement of an express Medtronic needle through the right pedicle into the anterior one-third at the vertebral body. The bone auger was advanced and a biopsy was retrieved and placed into formalin sent to the lab labeled L2. The right L3 pedicle was then infiltrated with 1% lidocaine. A small incision was made with 11 blade scalpel, and then a Medtronic 8 gauge needle was advanced through the right pedicle into the posterior 1/3 of the vertebral body. Bone auger was advanced and a biopsy was retrieved placed into formalin sent to the lab labeled L3. Cannula was removed at the L3 level. Balloon was inserted at the L2 level and inflated under fluoroscopic observation. Methylmethacrylate  mixture was then reconstituted. Under biplane intermittent fluoroscopy, the methylmethacrylate was then injected into the cavity at L2 vertebral body with filling of the fracture cleft and good intercalation within the interstices No extravasation was noted posteriorly into the spinal canal. Minimal right-sided epidural venous contamination was seen. The needles were then removed. Hemostasis was achieved at the skin entry site. There were no  acute complications. Patient tolerated the procedure well. The patient was observed for 30 minutes and returned to her room in good condition. IMPRESSION: Status post image guided treatment of L2 compression fracture with unipedicular right pedicle approach for bone biopsy and kyphoplasty, as well as right-sided transpedicular approach to the L3 vertebral body for biopsy. Signed, Dulcy Fanny. Dellia Nims, RPVI [facility] Radiology Electronically Signed   By: Corrie Mckusick D.O.   On: 12/13/2018 15:18   Ir Kypho Lumbar Inc Fx Reduce Bone Bx Uni/bil Cannulation Inc/imaging  Result Date: 12/13/2018 INDICATION: 73 year old male with a history of symptomatic compression fracture of the L2 level. Note that the patient has 6 non-rib-bearing lumbar type vertebral bodies and the acute fracture is at L2 with a questionable pathologic lesion at L3. The patient has not had significant symptoms before recent fall with the new L2 compression fracture. Today we will proceed with treatment of the L2 fracture and biopsy of the L3 level. EXAM: IR KYPHO VERTEBRAL LUMBAR AUGMENTATION; IR FLUORO GUIDE NEEDLE PLACEMENT /BIOPSY COMPARISON:  MRI 12/04/2018, CT 12/01/2018 MEDICATIONS: As antibiotic prophylaxis, 2 g Ancef was ordered pre-procedure and administered intravenously within 1 hour of incision. ANESTHESIA/SEDATION: Moderate (conscious) sedation was employed during this procedure. A total of Versed 2.0 mg and Fentanyl 100 mcg was administered intravenously. Moderate Sedation Time: 25 minutes. The patient's level of consciousness and vital signs were monitored continuously by radiology nursing throughout the procedure under my direct supervision. FLUOROSCOPY TIME:  Fluoroscopy Time: 4 minutes 30 seconds (1,025 mGy) COMPLICATIONS: None PROCEDURE: Following a full explanation of the procedure along with the potentially associated complications, a witnessed informed consent was obtained.  Specific risks that were discussed included bleeding, infection, injury to adjacent structures, neurologic injury, embolization of cement within the veins, failure of the procedure to improve pain, need for further procedure/ surgery, cardiopulmonary collapse, death. The patient understands the risks and wishes to proceed. The patient was placed prone on the fluoroscopic table. Nasal oxygen was administered. Physiologic monitoring was performed throughout the duration of the procedure. The skin overlying the L2 region was prepped and draped in the usual sterile fashion. The L2 vertebral body was identified and the right pedicle was infiltrated with 1% lidocaine. This was then followed by the advancement of an express Medtronic needle through the right pedicle into the anterior one-third at the vertebral body. The bone auger was advanced and a biopsy was retrieved and placed into formalin sent to the lab labeled L2. The right L3 pedicle was then infiltrated with 1% lidocaine. A small incision was made with 11 blade scalpel, and then a Medtronic 8 gauge needle was advanced through the right pedicle into the posterior 1/3 of the vertebral body. Bone auger was advanced and a biopsy was retrieved placed into formalin sent to the lab labeled L3. Cannula was removed at the L3 level. Balloon was inserted at the L2 level and inflated under fluoroscopic observation. Methylmethacrylate mixture was then reconstituted. Under biplane intermittent fluoroscopy, the methylmethacrylate was then injected into the cavity at L2 vertebral body with filling of the fracture cleft and good  intercalation within the interstices No extravasation was noted posteriorly into the spinal canal. Minimal right-sided epidural venous contamination was seen. The needles were then removed. Hemostasis was achieved at the skin entry site. There were no acute complications. Patient tolerated the procedure well. The patient was observed for 30 minutes and  returned to her room in good condition. IMPRESSION: Status post image guided treatment of L2 compression fracture with unipedicular right pedicle approach for bone biopsy and kyphoplasty, as well as right-sided transpedicular approach to the L3 vertebral body for biopsy. Signed, Dulcy Fanny. Dellia Nims, RPVI [facility] Radiology Electronically Signed   By: Corrie Mckusick D.O.   On: 12/13/2018 15:18   Ir Radiology Peripheral Guided Iv Start  Result Date: 12/13/2018 INDICATION: 73 year old male presents for treatment of symptomatic compression fracture EXAM: ULTRASOUND-GUIDED VENA PUNCTURE MEDICATIONS: NONE ANESTHESIA/SEDATION: NONE FLUOROSCOPY TIME:  None COMPLICATIONS: None PROCEDURE: Informed written consent was obtained from the patient after a thorough discussion of the procedural risks, benefits and alternatives. All questions were addressed. Sterile Barrier Technique was utilized including caps, mask, sterile gloves, sterile drape, hand hygiene and skin antiseptic. A timeout was performed prior to the initiation of the procedure. Ultrasound survey of the upper extremity was performed with images stored and sent to PACs. A micropuncture needle was used access the left brachial vein under ultrasound. With venous blood flow returned, an .018 micro wire was passed through the needle into the vein. The needle was removed, and a micropuncture sheath was placed over the wire. The inner dilator and wire were removed, and the catheter was secured in position after attaching to saline flush. Patient tolerated the procedure well and remained hemodynamically stable throughout. No complications were encountered and no significant blood loss. IMPRESSION: Status post ultrasound-guided IV access. Signed, Dulcy Fanny. Dellia Nims, RPVI [facility] Radiology Electronically Signed   By: Corrie Mckusick D.O.   On: 12/13/2018 15:12     Impression/Plan:  NSCLC, in remission after radiation treatment  I reviewed the recent chest CT scan with the patient and his wife today. No evidence of recurrence was seen.  Plan: Follow up in 4 months with a repeat Chest CT at that time.   _____________________________________   [name], MD  This document serves as a record of services personally performed by [name], MD. It was created on her behalf by Wilburn Mylar, a trained medical scribe. The creation of this record is based on the scribe's personal observations and the provider's statements to them. This document has been checked and approved by the attending provider.

## 2019-01-09 ENCOUNTER — Other Ambulatory Visit: Payer: Self-pay | Admitting: Radiation Oncology

## 2019-01-09 ENCOUNTER — Encounter: Payer: Self-pay | Admitting: Radiation Oncology

## 2019-01-09 DIAGNOSIS — C3412 Malignant neoplasm of upper lobe, left bronchus or lung: Secondary | ICD-10-CM

## 2019-01-10 ENCOUNTER — Other Ambulatory Visit: Payer: Self-pay | Admitting: "Endocrinology

## 2019-01-14 ENCOUNTER — Other Ambulatory Visit: Payer: Self-pay | Admitting: Family Medicine

## 2019-01-19 ENCOUNTER — Other Ambulatory Visit: Payer: Self-pay | Admitting: Family Medicine

## 2019-02-11 ENCOUNTER — Other Ambulatory Visit: Payer: Self-pay | Admitting: *Deleted

## 2019-02-11 MED ORDER — OXYCODONE-ACETAMINOPHEN 7.5-325 MG PO TABS: 1.0000 | ORAL_TABLET | ORAL | 0 refills | Status: DC | PRN

## 2019-02-11 NOTE — Telephone Encounter (Signed)
Received call from patient wife.   Requested refill on Oxycodone/APAP.  Ok to refill??  Last office visit/ refill 11/05/2018.

## 2019-02-17 ENCOUNTER — Other Ambulatory Visit: Payer: Self-pay | Admitting: Gastroenterology

## 2019-02-23 ENCOUNTER — Other Ambulatory Visit: Payer: Self-pay | Admitting: Family Medicine

## 2019-02-27 ENCOUNTER — Ambulatory Visit: Payer: Medicare Other | Admitting: "Endocrinology

## 2019-02-28 DIAGNOSIS — N182 Chronic kidney disease, stage 2 (mild): Secondary | ICD-10-CM | POA: Diagnosis not present

## 2019-02-28 DIAGNOSIS — Z794 Long term (current) use of insulin: Secondary | ICD-10-CM | POA: Diagnosis not present

## 2019-02-28 DIAGNOSIS — E1122 Type 2 diabetes mellitus with diabetic chronic kidney disease: Secondary | ICD-10-CM | POA: Diagnosis not present

## 2019-02-28 DIAGNOSIS — E559 Vitamin D deficiency, unspecified: Secondary | ICD-10-CM | POA: Diagnosis not present

## 2019-03-01 LAB — COMPLETE METABOLIC PANEL WITH GFR
AG RATIO: 1.3 (calc) (ref 1.0–2.5)
ALKALINE PHOSPHATASE (APISO): 80 U/L (ref 35–144)
ALT: 7 U/L — AB (ref 9–46)
AST: 10 U/L (ref 10–35)
Albumin: 3.7 g/dL (ref 3.6–5.1)
BILIRUBIN TOTAL: 0.2 mg/dL (ref 0.2–1.2)
BUN/Creatinine Ratio: 15 (calc) (ref 6–22)
BUN: 19 mg/dL (ref 7–25)
CHLORIDE: 106 mmol/L (ref 98–110)
CO2: 25 mmol/L (ref 20–32)
Calcium: 10.4 mg/dL — ABNORMAL HIGH (ref 8.6–10.3)
Creat: 1.27 mg/dL — ABNORMAL HIGH (ref 0.70–1.18)
GFR, EST AFRICAN AMERICAN: 65 mL/min/{1.73_m2} (ref >=60)
GFR, Est Non African American: 56 mL/min/{1.73_m2} — ABNORMAL LOW (ref >=60)
Globulin: 2.8 g/dL (calc) (ref 1.9–3.7)
Glucose, Bld: 67 mg/dL (ref 65–99)
POTASSIUM: 4.4 mmol/L (ref 3.5–5.3)
Sodium: 138 mmol/L (ref 135–146)
TOTAL PROTEIN: 6.5 g/dL (ref 6.1–8.1)

## 2019-03-01 LAB — HEMOGLOBIN A1C
HEMOGLOBIN A1C: 6.4 %{Hb} — AB (ref <5.7)
MEAN PLASMA GLUCOSE: 137 (calc)
eAG (mmol/L): 7.6 (calc)

## 2019-03-01 LAB — VITAMIN D 25 HYDROXY (VIT D DEFICIENCY, FRACTURES): Vit D, 25-Hydroxy: 19 ng/mL — ABNORMAL LOW (ref 30–100)

## 2019-03-04 ENCOUNTER — Other Ambulatory Visit: Payer: Self-pay | Admitting: Family Medicine

## 2019-03-07 ENCOUNTER — Ambulatory Visit (INDEPENDENT_AMBULATORY_CARE_PROVIDER_SITE_OTHER): Payer: Medicare Other | Admitting: "Endocrinology

## 2019-03-07 ENCOUNTER — Encounter: Payer: Self-pay | Admitting: "Endocrinology

## 2019-03-07 DIAGNOSIS — N182 Chronic kidney disease, stage 2 (mild): Secondary | ICD-10-CM | POA: Diagnosis not present

## 2019-03-07 DIAGNOSIS — E1122 Type 2 diabetes mellitus with diabetic chronic kidney disease: Secondary | ICD-10-CM

## 2019-03-07 DIAGNOSIS — Z794 Long term (current) use of insulin: Secondary | ICD-10-CM

## 2019-03-07 DIAGNOSIS — E782 Mixed hyperlipidemia: Secondary | ICD-10-CM | POA: Diagnosis not present

## 2019-03-07 DIAGNOSIS — E559 Vitamin D deficiency, unspecified: Secondary | ICD-10-CM | POA: Diagnosis not present

## 2019-03-07 DIAGNOSIS — E039 Hypothyroidism, unspecified: Secondary | ICD-10-CM

## 2019-03-07 MED ORDER — VITAMIN D3 125 MCG (5000 UT) PO CAPS: 5000.0000 [IU] | ORAL_CAPSULE | Freq: Every day | ORAL | 0 refills | Status: DC

## 2019-03-07 NOTE — Progress Notes (Signed)
Endocrinology Telephone Visit Follow up Note -During COVID -19 Pandemic    Subjective:    Patient ID: [name], male    DOB: September 05, 1946,    Past Medical History:  Diagnosis Date  . Arthritis   . Asthma    "hx of"  . Barrett's esophagus    EGD 03/23/2011 & EGD 2/09 bx proven  . BPH (benign prostatic hyperplasia)   . Cancer of parotid gland ([place]) 11/23/12   Adenocarcinoma  . Chronic abdominal pain   . Chronic constipation   . Colon polyp 03/23/2011   tubular adenoma, Dr. Gala Romney  . 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 ([place]) 07/15/2011  . CVA (cerebral infarction) 1998   right sided deficit  . Delayed gastric emptying 2018  . Diverticulosis    TCS 03/23/11 pancolonic diverticula &TCS 5/08, pancolonic diverticula  . DM (diabetes mellitus) ([place])   . Edema of lower extremity 12/21/12   bilateral   . Esotropia of left eye   . GERD (gastroesophageal reflux disease)   . Glaucoma (increased eye pressure)   . Gout   . Gout   . Hemorrhagic colitis 06/06/2012.  [name] Hemorrhoids, internal 03/23/2011   tcs by Dr. Gala Romney  . Hepatitis    esosiniphilic, tx with prednisone  . Hiatal hernia   . History of radiation therapy 05/21/18- 05/30/18   Left Lung/ 54 Gy delivered in 3 fractions of 18 Gy. SBRT  . HTN (hypertension)   . Hx of radiation therapy 1974   right base of skull area-lymphoma  . Hyperlipidemia   . Lower facial weakness    Right  . Lymphoma ([place]) 1974   XRT at [facility], right base of skull area  . Neuropathy   . Peripheral edema    R>L legs  . Rash    chronic, recurrent, R>L legs  . Renal insufficiency   . Steatohepatitis    liver biopsy 2009  . Stroke [facility]) 1998   right hemiparesis/plegia   Past Surgical History:  Procedure Laterality Date  . BIOPSY  12/01/2016   Procedure: BIOPSY;  Surgeon: Daneil Dolin, MD;  Location: [facility] END[facility] SUITE;  Service: Endoscopy;;  duodenum, gastric, esophagus  .  CHOLECYSTECTOMY    . COLONOSCOPY  03/23/11   Dr. Gala Romney  pancolonic diverticula, hemorrhoids, tubular adenoma.. next tcs 03/2016  . COLONOSCOPY WITH PROPOFOL N/A 12/01/2016   inadequate bowel prep precluded exam  . ESOPHAGOGASTRODUODENOSCOPY  02/05/08   goblet cell metaplasia/negative for H.pylori  . ESOPHAGOGASTRODUODENOSCOPY  03/23/11   Dr. Gala Romney, barretts, hiatal hernia  . ESOPHAGOGASTRODUODENOSCOPY (EGD) WITH PROPOFOL N/A 12/01/2016   Dr. Gala Romney: Large amount of retained gastric contents precluded completion of the stomach. Mucosal changes were found in the stomach. Erosions and somewhat scalloped appearing mucosa present, reactive gastritis/no H pylori. Barrett's esophagus noted, no dysplasia on biopsy. Duodenal biopsies taken as well, benign, no evidence of eosinophilia.  . IR FLUORO GUIDED NEEDLE PLC ASPIRATION/INJECTION LOC  12/13/2018  . IR KYPHO LUMBAR INC FX REDUCE BONE BX UNI/BIL CANNULATION INC/IMAGING  12/13/2018  . IR RADIOLOGY PERIPHERAL GUIDED IV START  12/13/2018  . IR US GUIDE VASC ACCESS LEFT  12/13/2018  . MASS BIOPSY  11/01/2012  Procedure: NECK MASS BIOPSY;  Surgeon: Ascencion Dike, MD;  Location: [facility] ORS;  Service: ENT;  Laterality: Right;  Excisional Bx Right Neck Mass; attempted external jugular cutdown of left side  . PAROTIDECTOMY  11/24/2012   Procedure: PAROTIDECTOMY;  Surgeon: Ascencion Dike, MD;  Location: [place];  Service: ENT;  Laterality: N/A;  Total parotidectomy  . PLEURECTOMY    . right lymph node removal Right    behind right ear  . Right video-assisted thoracic surgery, pleurectomy, and pleurodesis  2011  . VIDEO BRONCHOSCOPY WITH ENDOBRONCHIAL NAVIGATION N/A 04/26/2018   Procedure: VIDEO BRONCHOSCOPY WITH ENDOBRONCHIAL NAVIGATION;  Surgeon: Melrose Nakayama, MD;  Location: [place];  Service: Thoracic;  Laterality: N/A;   Social History   Socioeconomic History  . Marital status: Married    Spouse name: Not on file  . Number of children: 1  . Years of education: Not  on file  . Highest education level: Not on file  Occupational History  . Occupation: disabled  Social Needs  . Financial resource strain: Not on file  . Food insecurity:    Worry: Not on file    Inability: Not on file  . Transportation needs:    Medical: Not on file    Non-medical: Not on file  Tobacco Use  . Smoking status: Former Smoker    Packs/day: 3.00    Years: 25.00    Pack years: 75.00    Types: Cigarettes    Last attempt to quit: 03/01/1997    Years since quitting: 22.0  . Smokeless tobacco: Never Used  Substance and Sexual Activity  . Alcohol use: No  . Drug use: No  . Sexual activity: Not Currently  Lifestyle  . Physical activity:    Days per week: Not on file    Minutes per session: Not on file  . Stress: Not on file  Relationships  . Social connections:    Talks on phone: Not on file    Gets together: Not on file    Attends religious service: Not on file    Active member of club or organization: Not on file    Attends meetings of clubs or organizations: Not on file    Relationship status: Not on file  Other Topics Concern  . Not on file  Social History Narrative  . Not on file   Outpatient Encounter Medications as of 03/07/2019  Medication Sig  . allopurinol (ZYLOPRIM) 300 MG tablet TAKE 1 TABLET BY MOUTH EVERY DAY  . AMITIZA 24 MCG capsule TAKE 1 CAPSULE(24 MCG) BY MOUTH TWICE DAILY WITH A MEAL  . amLODipine (NORVASC) 10 MG tablet TAKE 1 TABLET BY MOUTH DAILY  . Cholecalciferol (VITAMIN D3) 125 MCG (5000 UT) CAPS Take 1 capsule (5,000 Units total) by mouth daily.  . cloNIDine (CATAPRES) 0.2 MG tablet TAKE 1 TABLET BY MOUTH THREE TIMES DAILY  . doxycycline (VIBRA-TABS) 100 MG tablet Take 1 tablet (100 mg total) by mouth 2 (two) times daily.  . famotidine (PEPCID) 20 MG tablet Take 1 tablet (20 mg total) by mouth daily.  . furosemide (LASIX) 40 MG tablet TAKE 1 TABLET BY MOUTH DAILY  . gabapentin (NEURONTIN) 300 MG capsule TAKE 2 CAPSULES BY MOUTH TWICE  DAILY. MAY TAKE AN ADDITIONAL 2 CAPSULES MIDDAY AS NEEDED FOR PAIN  . Insulin Glargine (LANTUS SOLOSTAR) 100 UNIT/ML Solostar Pen Inject 50 Units into the skin at bedtime.  [name] lisinopril (PRINIVIL,ZESTRIL) 20 MG tablet TAKE 1 TABLET BY MOUTH DAILY  .  metFORMIN (GLUCOPHAGE) 500 MG tablet TAKE 1 TABLET BY MOUTH TWICE DAILY  . metoprolol succinate (TOPROL-XL) 50 MG 24 hr tablet TAKE 1 TABLET BY MOUTH EVERY DAY WITH OR IMMEDIATELY FOLLOWING A MEAL  . ONETOUCH DELICA LANCETS FINE MISC USE AS DIRECTED FOUR TIMES DAILY  . ONETOUCH VERIO test strip TEST FOUR TIMES DAILY  . oxyCODONE (ROXICODONE) 5 MG immediate release tablet Take 1 tablet (5 mg total) by mouth every 4 (four) hours as needed for breakthrough pain.  [name] oxyCODONE-acetaminophen (PERCOCET) 7.5-325 MG tablet Take 1 tablet by mouth every 4 (four) hours as needed.  . pantoprazole (PROTONIX) 40 MG tablet TAKE 1 TABLET BY MOUTH DAILY  . potassium chloride SA (K-DUR,KLOR-CON) 20 MEQ tablet TAKE 1 TABLET(20 MEQ) BY MOUTH DAILY  . simvastatin (ZOCOR) 20 MG tablet TAKE 1 TABLET BY MOUTH EVERY NIGHT AT BEDTIME  . tamsulosin (FLOMAX) 0.4 MG CAPS capsule TAKE 1 CAPSULE BY MOUTH DAILY  . tiZANidine (ZANAFLEX) 2 MG tablet TAKE 1 TABLET(2 MG) BY MOUTH TWICE DAILY AS NEEDED  . Triamcinolone Acetonide (TRIAMCINOLONE 0.1 % CREAM : EUCERIN) CREA Apply 1 application topically 2 (two) times daily as needed. (Patient taking differently: Apply 1 application topically 2 (two) times daily as needed for rash. )   No facility-administered encounter medications on file as of 03/07/2019.    ALLERGIES: No Known Allergies VACCINATION STATUS: Immunization History  Administered Date(s) Administered  . Influenza Split 08/24/2012  . Influenza, High Dose Seasonal PF 10/31/2017, 10/02/2018  . Influenza,inj,Quad PF,6+ Mos 10/09/2013, 09/05/2014, 11/23/2015, 08/16/2016  . Pneumococcal Conjugate-13 01/07/2014  . Pneumococcal Polysaccharide-23 11/25/2012    Diabetes  He  presents for his follow-up diabetic visit. He has type 2 diabetes mellitus. Onset time: diagnosed approx at age 5. His disease course has been stable. There are no hypoglycemic associated symptoms. Pertinent negatives for hypoglycemia include no confusion, pallor or seizures. Pertinent negatives for diabetes include no fatigue, no polydipsia, no polyphagia, no polyuria and no weakness. Symptoms are stable. Risk factors for coronary artery disease include diabetes mellitus, dyslipidemia, hypertension, male sex, obesity, sedentary lifestyle and tobacco exposure. Current diabetic treatment includes insulin injections. He is following a generally unhealthy diet. When asked about meal planning, he reported none. He has had a previous visit with a dietitian. He never participates in exercise. His breakfast blood glucose range is generally 140-180 mg/dl. His bedtime blood glucose range is generally 140-180 mg/dl. His overall blood glucose range is 140-180 mg/dl. An ACE inhibitor/angiotensin II receptor blocker is being taken.  Hyperlipidemia  This is a chronic problem. The current episode started more than 1 year ago. The problem is uncontrolled. Recent lipid tests were reviewed and are variable. Exacerbating diseases include diabetes and obesity. Pertinent negatives include no myalgias. Risk factors for coronary artery disease include diabetes mellitus, dyslipidemia, hypertension, male sex and a sedentary lifestyle.   Objective:    CMP  CMP Latest Ref Rng & Units 02/28/2019 12/13/2018 12/01/2018  Glucose 65 - 99 mg/dL 67 82 106(H)  BUN 7 - 25 mg/dL 19 17 20   Creatinine 0.70 - 1.18 mg/dL 1.27(H) 1.54(H) 1.31(H)  Sodium 135 - 146 mmol/L 138 139 140  Potassium 3.5 - 5.3 mmol/L 4.4 5.0 4.2  Chloride 98 - 110 mmol/L 106 105 108  CO2 20 - 32 mmol/L 25 25 24   Calcium 8.6 - 10.3 mg/dL 10.4(H) 10.5(H) 10.5(H)  Total Protein 6.1 - 8.1 g/dL 6.5 - 7.5  Total Bilirubin 0.2 - 1.2 mg/dL 0.2 - 0.2(L)  Alkaline Phos 38 -  126 U/L - - 72  AST 10 - 35 U/L 10 - 18  ALT 9 - 46 U/L 7(L) - 15    Diabetic Labs (most recent): Lab Results  Component Value Date   HGBA1C 6.4 (H) 02/28/2019   HGBA1C 6.3 (H) 10/25/2018   HGBA1C 6.8 (H) 07/19/2018   Lipid Panel     Component Value Date/Time   CHOL 151 11/01/2017 0828   TRIG 219 (H) 11/01/2017 0828   HDL 56 11/01/2017 0828   CHOLHDL 2.7 11/01/2017 0828   VLDL 66 (H) 05/04/2017 0931   LDLCALC 66 11/01/2017 0828     Assessment & Plan:   1. Type 2 diabetes mellitus with stage 2 chronic kidney disease and CVA -This is a telephone visit due to the coronavirus pandemic -His previsit labs show A1c stable at 6.4%.  His blood glucose readings are reported to be near target.   -  His diabetes is complicated by recurrent CVA, stage 2-3 renal insufficiency, and he remains at extremely high risk for more acute and chronic complications of diabetes which include CAD, CVA, CKD, retinopathy, and neuropathy. -In May 2019, he was diagnosed with non-small cell lung cancer , status post multiple rounds of radiation treatment completed on May 30, 2018.   -His wife reports rare and random morning hypoglycemia in 260s.   Glucose logs and insulin administration records pertaining to this visit,  to be scanned into patient's records.  Recent labs reviewed.     -I spoke with his wife who is his caretaker and advised to continue Lantus 50 units nightly associated with strict monitoring of blood glucose 2 times a day-daily before breakfast and at bedtime.  He will not need prandial insulin for now.  He is advised to continue metformin 500 mg p.o. twice daily-daily after breakfast and after supper.    -Patient is not a candidate for SGLT2 inhibitors due to CKD, not a suitable candidate for incretin therapy.  2) Lipids/HPL: Recent lipid panel showed controlled LDL at 66.  He is advised to continue simvastatin 20 mg p.o. nightly.     3) hypothyroidism:  - I advised him to continue    levothyroxine 88 g by mouth every morning.  - We discussed about the correct intake of his thyroid hormone, on empty stomach at fasting, with water, separated by at least 30 minutes from breakfast and other medications,  and separated by more than 4 hours from calcium, iron, multivitamins, acid reflux medications (PPIs). -Patient is made aware of the fact that thyroid hormone replacement is needed for life, dose to be adjusted by periodic monitoring of thyroid function tests. 4) vitamin D deficiency I discussed and initiated vitamin D3 5000 units daily for the next 90 days.    5) hypercalcemia -stable with current calcium of 10.4.   Could be related to his non-small cell lung cancer.  His PTH is mildly elevated at 121, not significant in the face of CKD.  He will not require suppressive therapy for this mildly elevated PTH.    -He is also at risk of immobilization hypercalcemia due to his prolonged immobilization secondary to his stroke.  If his calcium continues to increase to above 11 mg/dL, would be considered for 1 dose of zoledronic acid infusion after next visit.   - I advised patient to maintain close follow up with Dr. Buelah Manis for primary care needs.  - Patient Care Time Today:  25 min, of which >50% was spent in reviewing his  current  and  previous labs/studies, blood glucose readings, previous treatments, and medications doses and developing a plan for long-term care based on the latest recommendations for standards of care.   [name] and his wife participated in the discussions, expressed understanding, and voiced agreement with the above plans.  All questions were answered to his satisfaction. he is encouraged to contact clinic should he have any questions or concerns prior to his return visit.   Follow up plan: Return in about 3 months (around 06/07/2019) for Follow up with Pre-visit Labs, Meter, and Logs.  Glade Lloyd, MD Phone: [phone]  Fax: [phone]  This note  was partially dictated with voice recognition software. Similar sounding words can be transcribed inadequately or may not  be corrected upon review.  03/07/2019, 8:52 AM

## 2019-03-08 ENCOUNTER — Other Ambulatory Visit: Payer: Self-pay

## 2019-03-08 ENCOUNTER — Ambulatory Visit (INDEPENDENT_AMBULATORY_CARE_PROVIDER_SITE_OTHER): Payer: Medicare Other | Admitting: Family Medicine

## 2019-03-08 DIAGNOSIS — L0292 Furuncle, unspecified: Secondary | ICD-10-CM

## 2019-03-08 DIAGNOSIS — I10 Essential (primary) hypertension: Secondary | ICD-10-CM | POA: Diagnosis not present

## 2019-03-08 DIAGNOSIS — N182 Chronic kidney disease, stage 2 (mild): Secondary | ICD-10-CM | POA: Diagnosis not present

## 2019-03-08 DIAGNOSIS — G8929 Other chronic pain: Secondary | ICD-10-CM

## 2019-03-08 DIAGNOSIS — R109 Unspecified abdominal pain: Secondary | ICD-10-CM | POA: Diagnosis not present

## 2019-03-08 DIAGNOSIS — E1122 Type 2 diabetes mellitus with diabetic chronic kidney disease: Secondary | ICD-10-CM | POA: Diagnosis not present

## 2019-03-08 DIAGNOSIS — L0293 Carbuncle, unspecified: Secondary | ICD-10-CM | POA: Diagnosis not present

## 2019-03-08 DIAGNOSIS — Z794 Long term (current) use of insulin: Secondary | ICD-10-CM

## 2019-03-08 MED ORDER — VITAMIN D3 125 MCG (5000 UT) PO CAPS: 5000.0000 [IU] | ORAL_CAPSULE | Freq: Every day | ORAL | 0 refills | Status: DC

## 2019-03-08 MED ORDER — DOXYCYCLINE HYCLATE 100 MG PO TABS: 100.0000 mg | ORAL_TABLET | Freq: Two times a day (BID) | ORAL | 0 refills | Status: DC

## 2019-03-08 NOTE — Progress Notes (Signed)
Virtual Visit via Telephone Note  I connected with [name] on 03/08/19 at 9:30 by telephone and verified that I am speaking with the correct person using two identifiers.    Pt Location: Home   Provider Location: Vic Blackbird MD, [facility]   On Call: Patient- [name] and Wife - [name]    I discussed the limitations, risks, security and privacy concerns of performing an evaluation and management service by telephone and the availability of in person appointments. I also discussed with the patient that there may be a patient responsible charge related to this service. The patient expressed understanding and agreed to proceed.   History of Present Illness:  No Current illness - no respiratory problems , wife feels he is overall doing well, he does have a spot on his back draining a little for past few days, no fever.     His wife manages his medications.  DM- had phoen with with endocrinology yesterday, A1C 6.4%,taking meds as prescribed, reviewed the labs with them over the phone  Low Vitamin D  Told by endocrinology but they have not received the prescription   HTN- Bp  120-125/79 -80's, no difficulty with the medication  Appetite is good- eats at least 2 good a meals day   He has history of impetigo bullosa as well as recurrent infected sebaceous cyst on his back and abscesses.Has recurrent boils and and abscess- has one that has pusand tenderness started a couple days ago, no fever- start antibiotics   Has chronic pain- taking oxycodone  Chronic constipation/OID- taking amitiza once a day, some days twice a day, BM ever 2 year   Peripheral edema- taking Lasix 27m / with potassium , but keeps his legs dangled down   BPH- taking flomax, has a good urine stream      CKD, Cr  1.27 in March    Squamous Cell Carcinoma of Lung- in remission     Observations/Objective:  No obvious distress over the phone   Assessment and  Plan:   DM- per endocrinology, well controlled   HTN- controlled, no changes   Recurrent abscess/cyst- treat with doxycyline has had multiple, no sign of systemic infection   Peripheral edema- continue lasix, can not get hose on, will not keep legs elevated, chronic issue    Vitamin D def- resent vitamin D 5000IU daily    Chronic pain- continue percocet      Follow Up Instructions:    I discussed the assessment and treatment plan with the patient. The patient was provided an opportunity to ask questions and all were answered. The patient agreed with the plan and demonstrated an understanding of the instructions.   The patient was advised to call back or seek an in-person evaluation if the symptoms worsen or if the condition fails to improve as anticipated.  I provided 15  minutes of non-face-to-face time during this encounter.  End Time 9:45am  KVic Blackbird MD

## 2019-03-09 ENCOUNTER — Encounter: Payer: Self-pay | Admitting: Family Medicine

## 2019-03-13 IMAGING — DX DG CHEST 2V
2 series · 2 of 2 positions shown · non-contrast
Comparison: 04/26/2018

CLINICAL DATA: Chronic abdominal pain

EXAM:
CHEST - 2 VIEW

[chest lat]
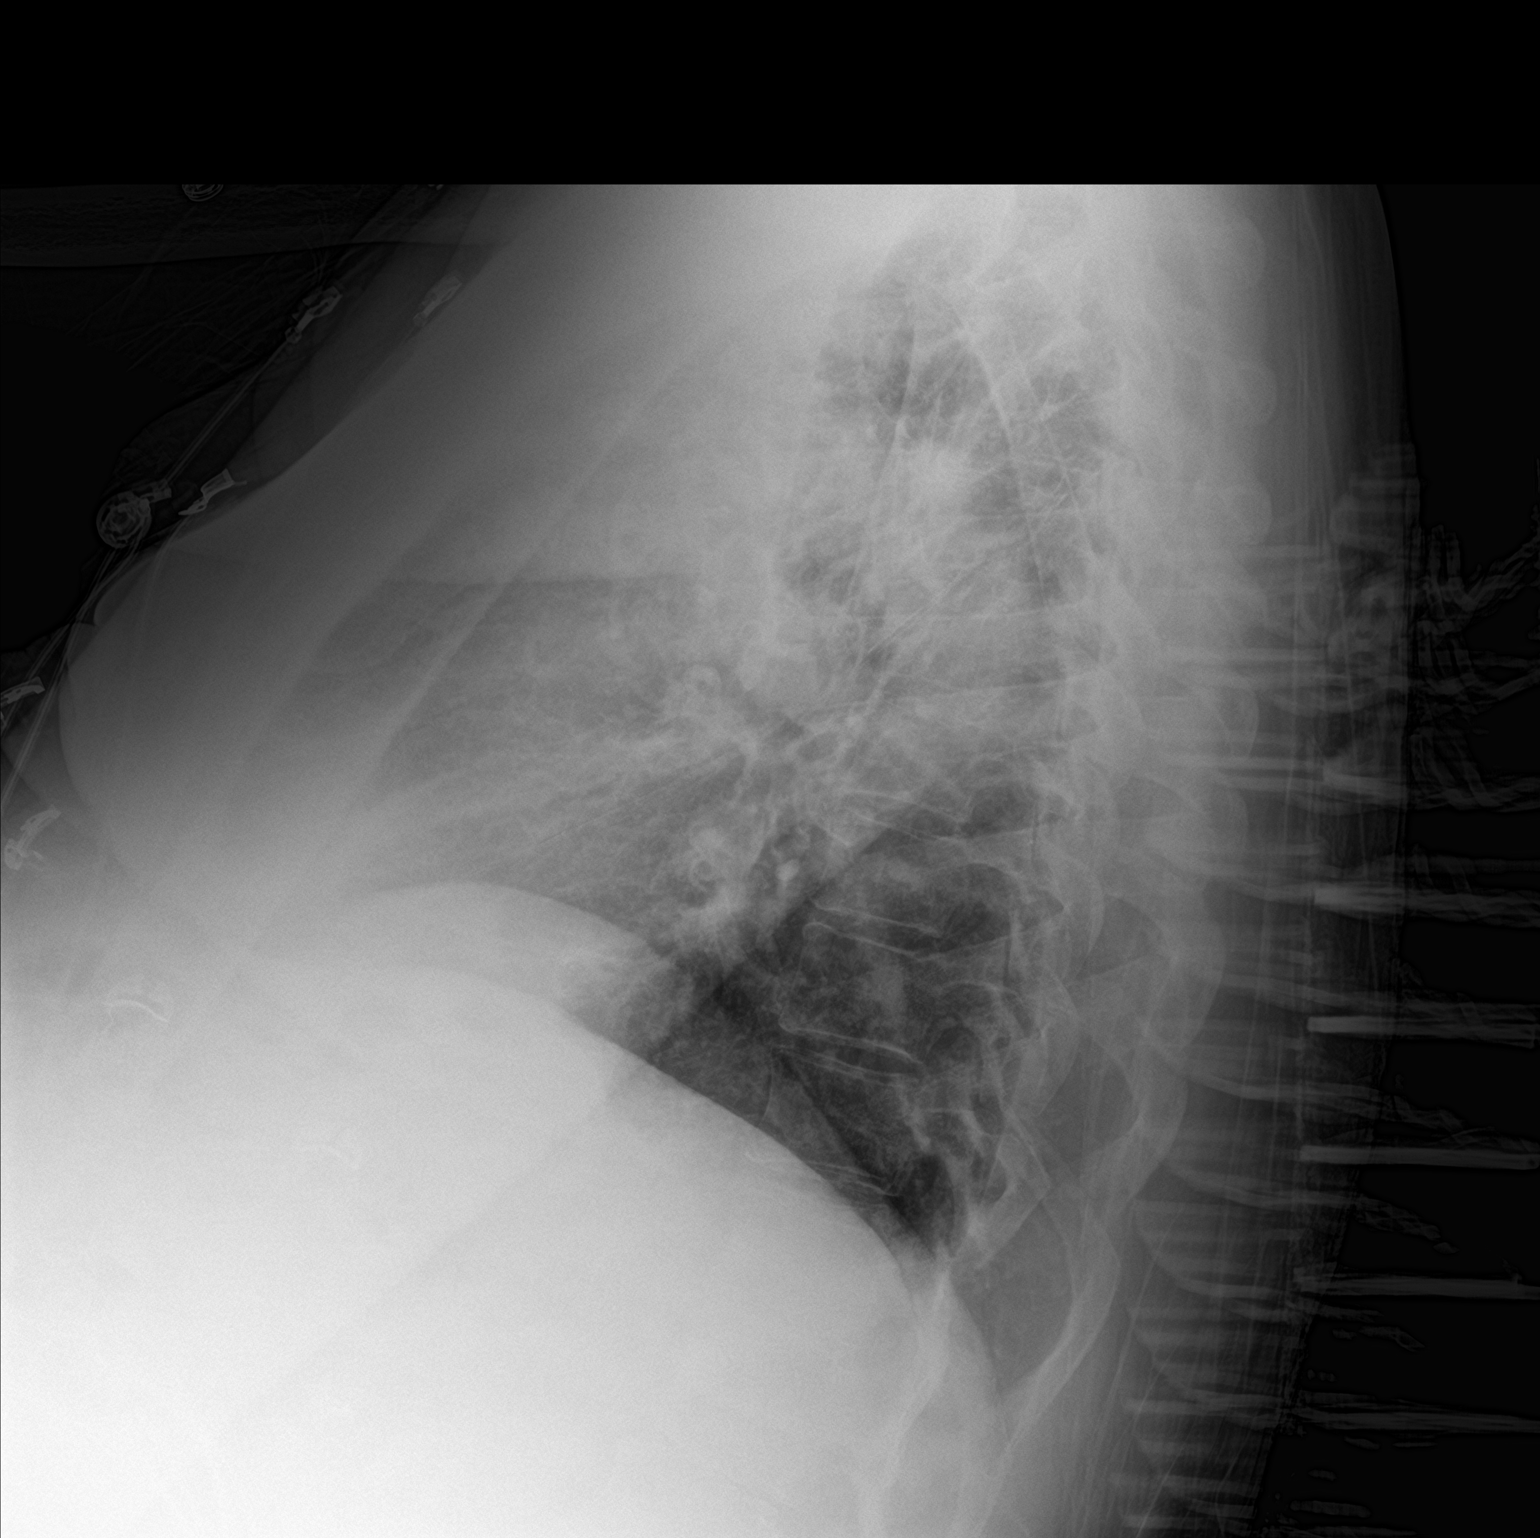

[chest ap]
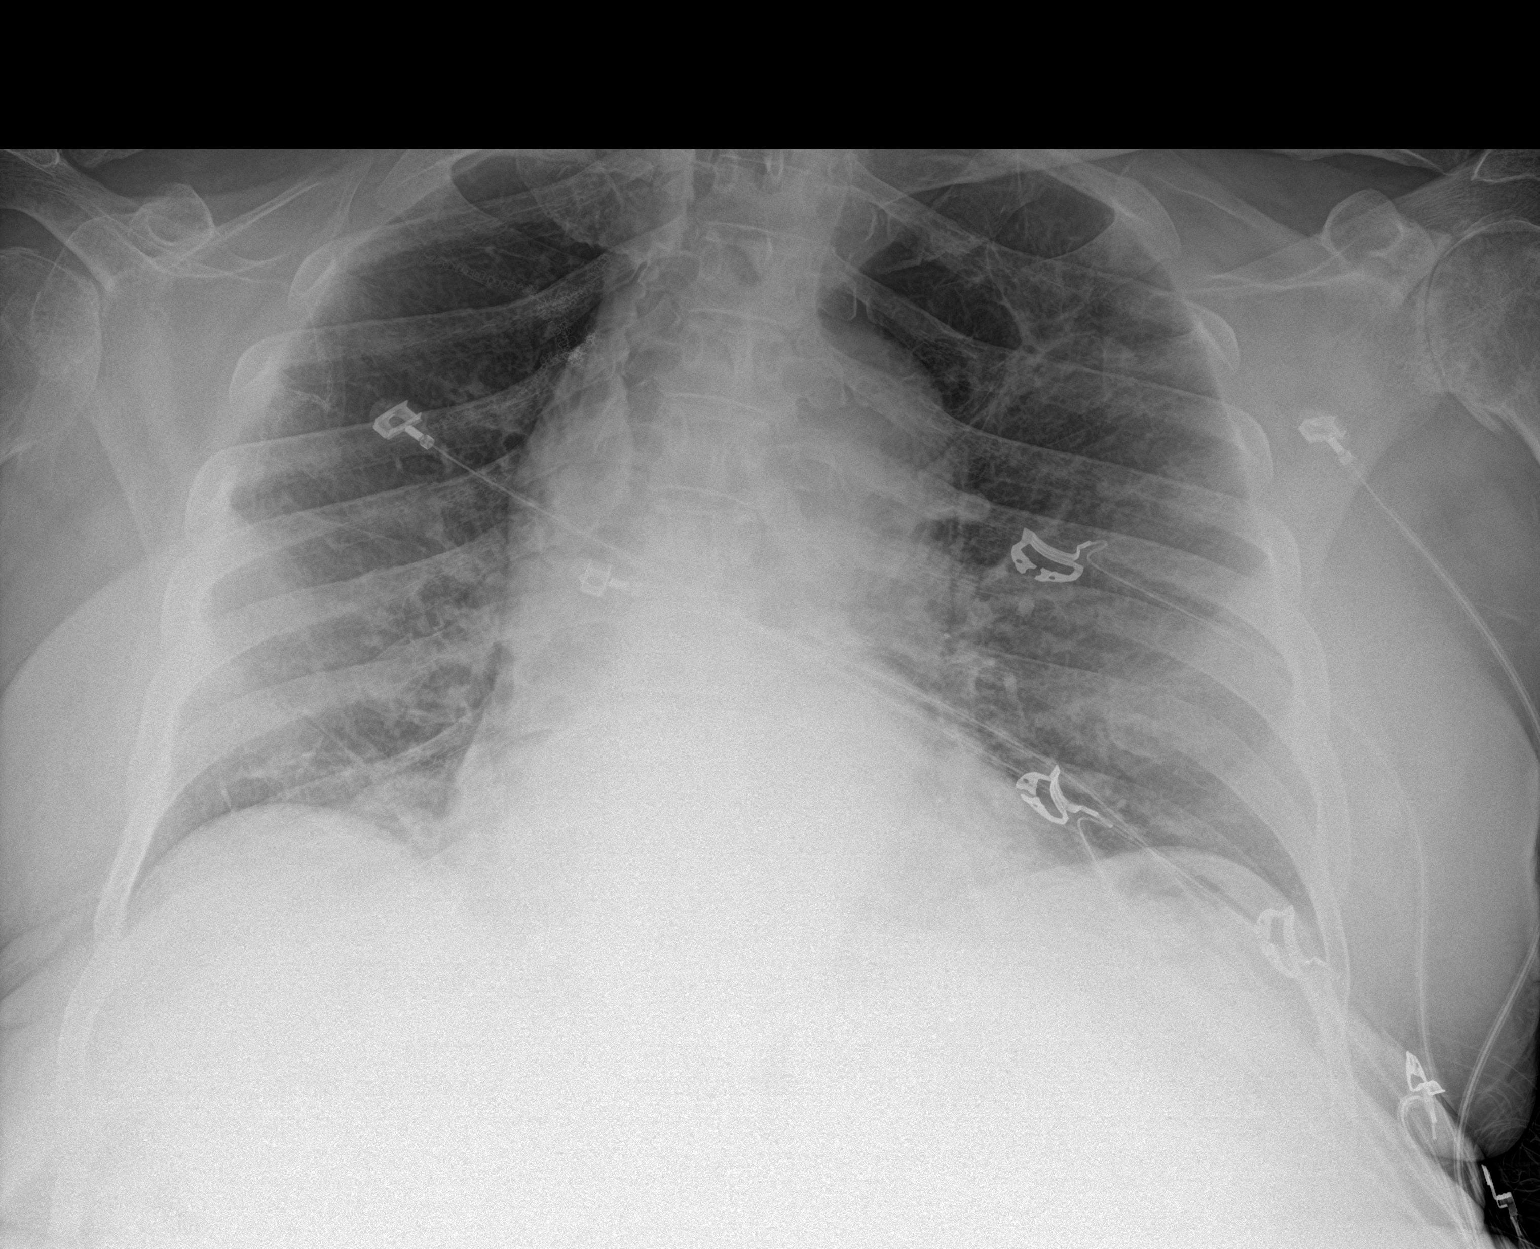

[2 of 2 positions shown; findings below may reference images not displayed]

FINDINGS: There is mild left apical scarring. There is no focal consolidation.
There is no pleural effusion or pneumothorax. The heart and
mediastinal contours are unremarkable.

The osseous structures are unremarkable.
IMPRESSION: No active cardiopulmonary disease.

## 2019-03-13 IMAGING — CT CT ABD-PELV W/O CM
2 of 4 series · 16 of 46 positions shown, 18 images · non-contrast
Comparison: 03/21/2018 PET-CT.  05/23/2016 CT abdomen/pelvis.

CLINICAL DATA: Acute on chronic abdominal pain. History of left
lung cancer.

EXAM:
CT ABDOMEN AND PELVIS WITHOUT CONTRAST
TECHNIQUE: Multidetector CT imaging of the abdomen and pelvis was performed
following the standard protocol without IV contrast.

[Series 2: axial st · axial · 0.95mm/px · z∈[+834,+1289]mm · 13 of 99 slices shown, 15 images]
[im 4/99  soft-tissue]
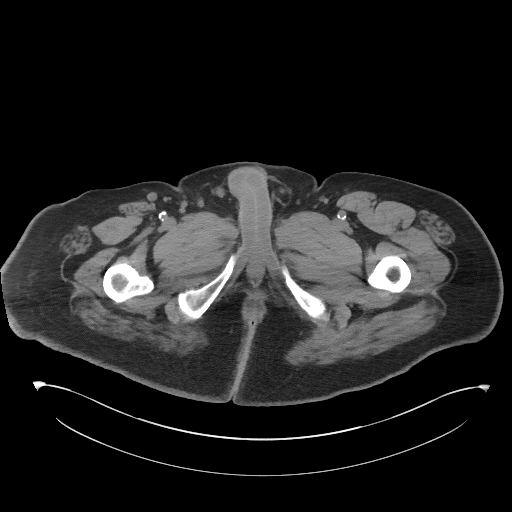
[im 4/99  bone]
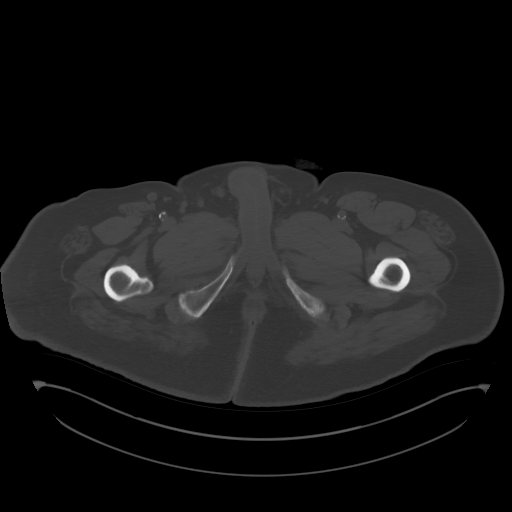
[im 12/99  soft-tissue]
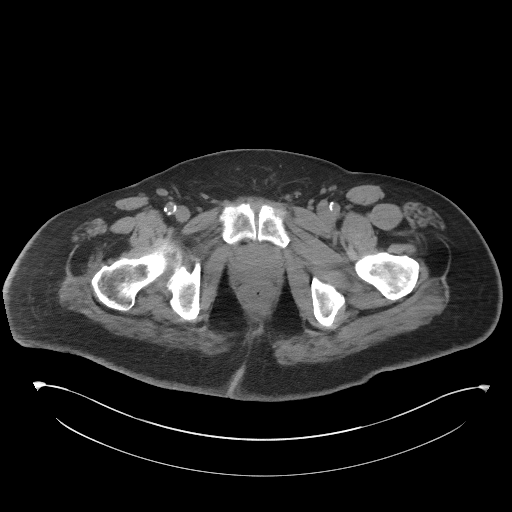
[im 20/99  soft-tissue]
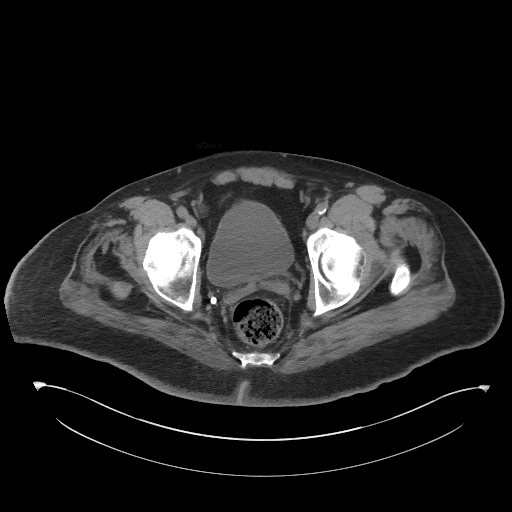
[im 28/99  soft-tissue]
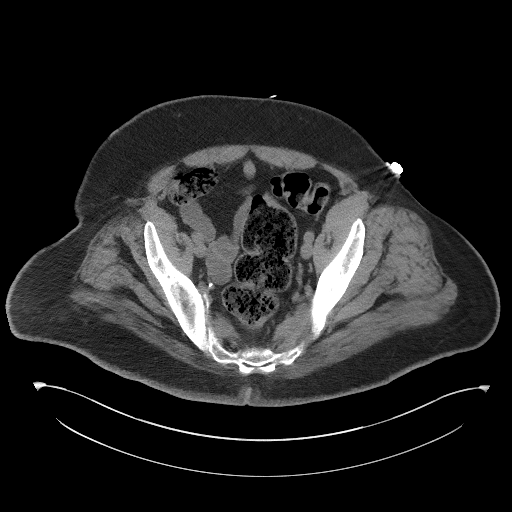
[im 36/99  soft-tissue]
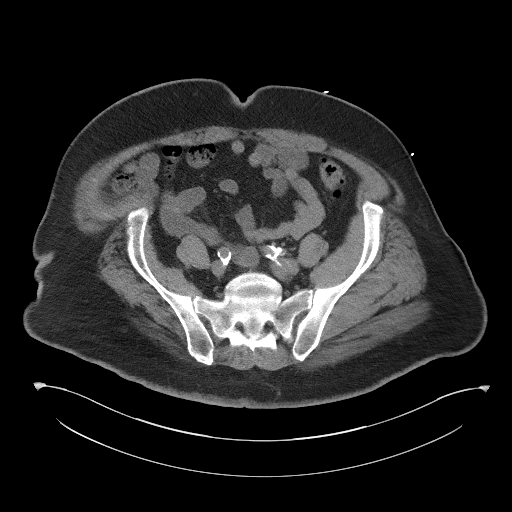
[im 44/99  soft-tissue]
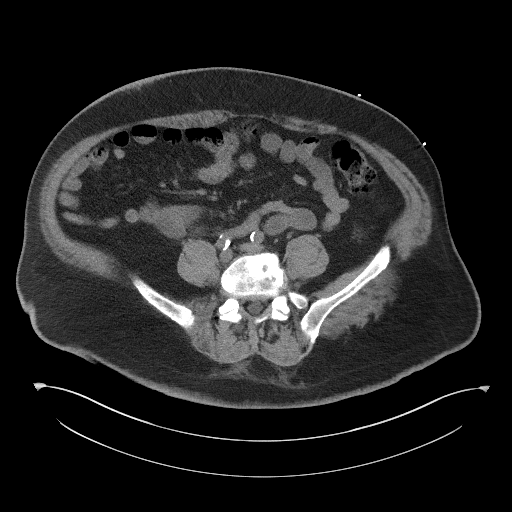
[im 51/99  soft-tissue]
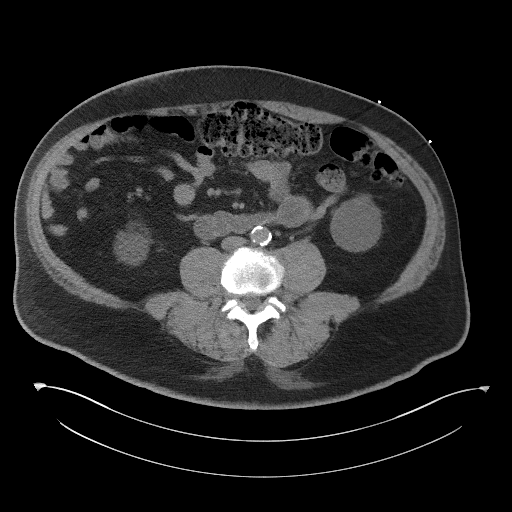
[im 55/99  soft-tissue]
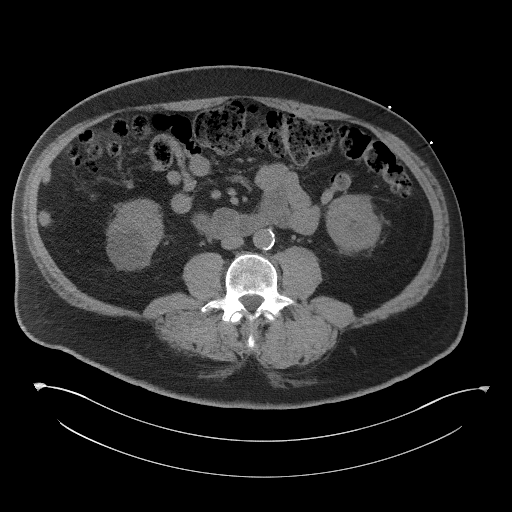
[im 63/99  soft-tissue]
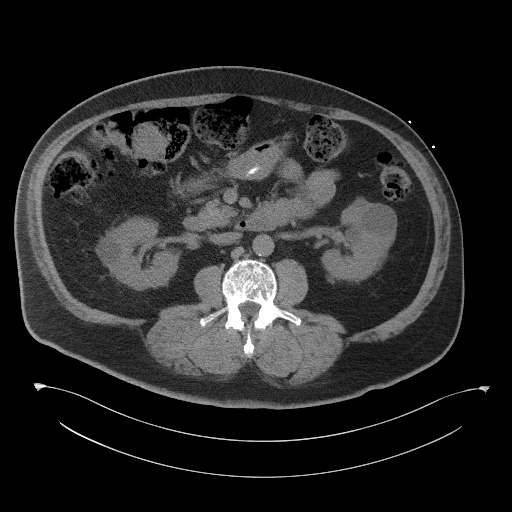
[im 63/99  bone]
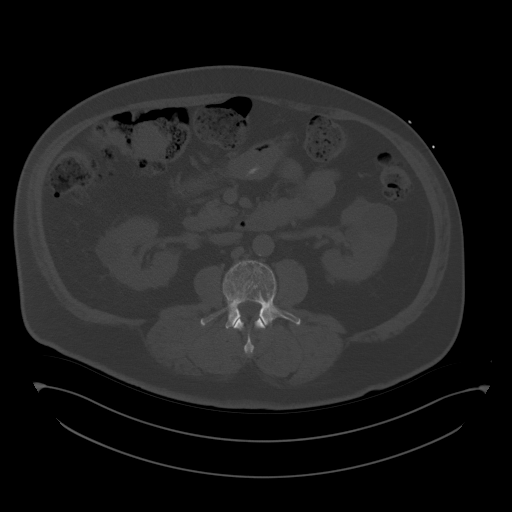
[im 71/99  soft-tissue]
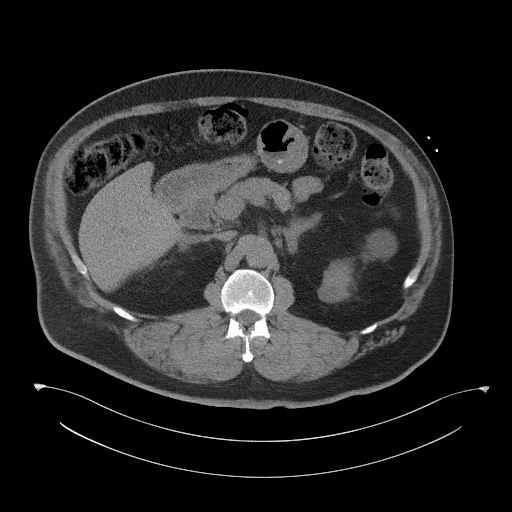
[im 79/99  soft-tissue]
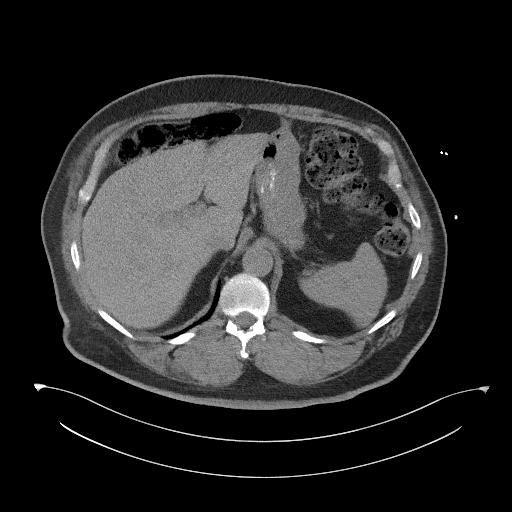
[im 87/99  soft-tissue]
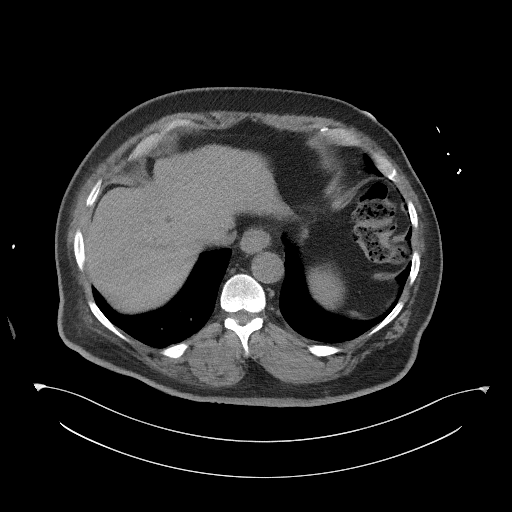
[im 95/99  soft-tissue]
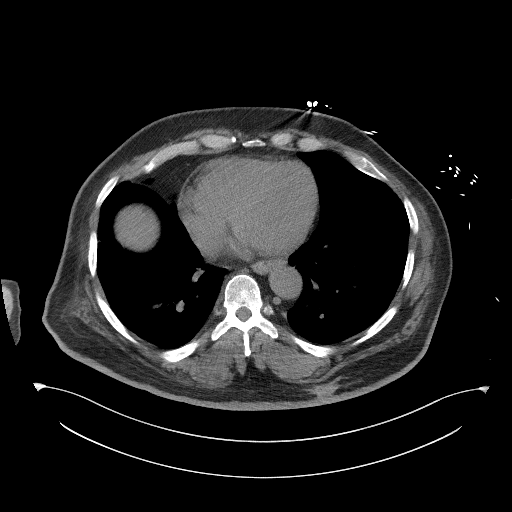

[Series 5: coronal st · coronal · 0.96mm/px · 3 of 107 slices shown]
[im 36/107  soft-tissue]
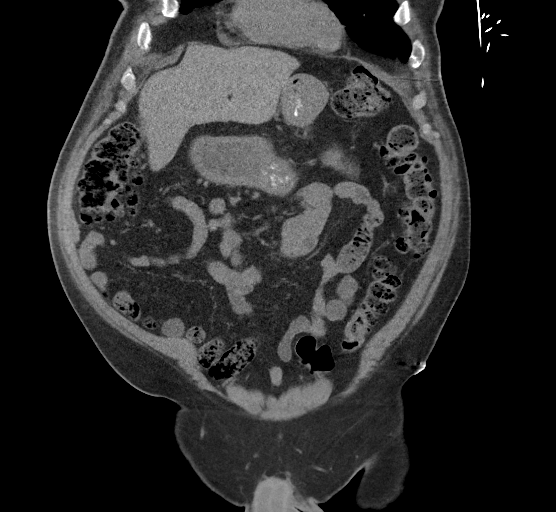
[im 48/107  soft-tissue]
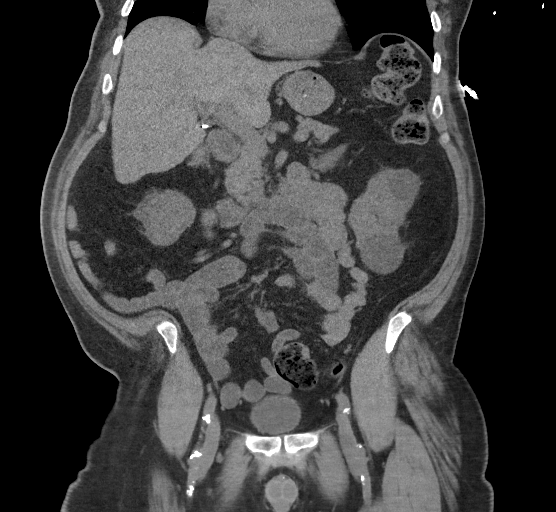
[im 59/107  soft-tissue]
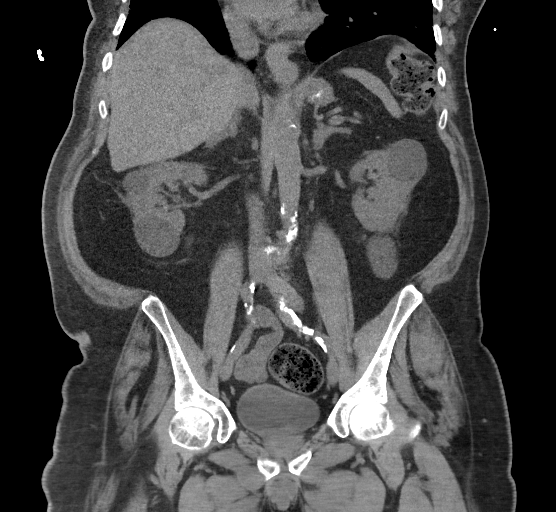

[16 of 46 positions shown; findings below may reference images not displayed]

FINDINGS: Lower chest: No significant pulmonary nodules or acute consolidative
airspace disease.

Hepatobiliary: Normal liver size. Two subcentimeter left superior
liver lobe lesions are too small to characterize and are stable
since 05/23/2016, considered benign. No new liver lesions.
Cholecystectomy. No biliary ductal dilatation.

Pancreas: Normal, with no mass or duct dilation.

Spleen: Normal size. No mass.

Adrenals/Urinary Tract: No discrete adrenal nodules. No
hydronephrosis. No renal stones. Several simple renal cysts in both
kidneys, largest 4.0 cm in the posterior lower right kidney and
cm in the lower left kidney. Normal bladder.

Stomach/Bowel: Small hiatal hernia. Otherwise normal nondistended
stomach. Normal caliber small bowel with no small bowel wall
thickening. Normal appendix. Marked diffuse colonic diverticulosis,
with no definite large bowel wall thickening or significant
pericolonic fat stranding.

Vascular/Lymphatic: Atherosclerotic nonaneurysmal abdominal aorta.
No pathologically enlarged lymph nodes in the abdomen or pelvis.

Reproductive: Moderately enlarged prostate.

Other: No pneumoperitoneum, ascites or focal fluid collection. Small
to moderate fat containing midline supraumbilical ventral abdominal
hernia, stable.

Musculoskeletal: Chronic lytic change throughout L2 vertebral body
is unchanged since 05/23/2016 CT and was not hypermetabolic on
recent PET-CT. Moderate thoracolumbar spondylosis. Healed
deformities in the lower right ribs.
IMPRESSION: 1. No acute abnormality. No evidence of bowel obstruction or acute
bowel inflammation. Normal appendix. Marked colonic diverticulosis,
with no evidence of acute diverticulitis.
2. Small hiatal hernia.
3. Moderately enlarged prostate.
4. Stable small to moderate fat containing supraumbilical ventral
abdominal hernia.
5.  Aortic Atherosclerosis ([id number]).

## 2019-03-15 ENCOUNTER — Telehealth: Payer: Self-pay | Admitting: Family Medicine

## 2019-03-15 NOTE — Telephone Encounter (Signed)
Patient would like a new one touch meter  If possible  [facility] [place]

## 2019-03-16 ENCOUNTER — Other Ambulatory Visit: Payer: Self-pay | Admitting: Family Medicine

## 2019-03-18 ENCOUNTER — Other Ambulatory Visit: Payer: Self-pay | Admitting: Family Medicine

## 2019-03-18 ENCOUNTER — Ambulatory Visit: Payer: Medicare Other | Admitting: Family Medicine

## 2019-03-18 ENCOUNTER — Telehealth: Payer: Self-pay | Admitting: "Endocrinology

## 2019-03-18 ENCOUNTER — Other Ambulatory Visit: Payer: Self-pay

## 2019-03-18 ENCOUNTER — Ambulatory Visit (INDEPENDENT_AMBULATORY_CARE_PROVIDER_SITE_OTHER): Payer: Medicare Other | Admitting: Family Medicine

## 2019-03-18 ENCOUNTER — Encounter: Payer: Self-pay | Admitting: Family Medicine

## 2019-03-18 DIAGNOSIS — E162 Hypoglycemia, unspecified: Secondary | ICD-10-CM | POA: Diagnosis not present

## 2019-03-18 DIAGNOSIS — R5383 Other fatigue: Secondary | ICD-10-CM | POA: Diagnosis not present

## 2019-03-18 DIAGNOSIS — R531 Weakness: Secondary | ICD-10-CM | POA: Diagnosis not present

## 2019-03-18 MED ORDER — AMLODIPINE BESYLATE 10 MG PO TABS: 10.0000 mg | ORAL_TABLET | Freq: Every day | ORAL | 1 refills | Status: DC

## 2019-03-18 NOTE — Telephone Encounter (Signed)
Request needs to go to Endo (Dr. Dorris Fetch).

## 2019-03-18 NOTE — Telephone Encounter (Signed)
Returned call from Pts wife concerning low BG levels in the a.m. No Answer. Left message for her to call back with at least 3 days of readings so we can get this information to Dr Dorris Fetch.

## 2019-03-18 NOTE — Telephone Encounter (Signed)
Pt states he has low BG readings in the mornings.    Date Before breakfast Before lunch Before supper Bedtime                            Pt taking:

## 2019-03-18 NOTE — Progress Notes (Signed)
Virtual Visit via Telephone Note  I connected with [name] on 03/18/19 at 2:26pm by telephone and verified that I am speaking with the correct person using two identifiers.   Pt location:at home    Physician location: In office, [place], [facility]     On call; Pt, wife and Physician  I discussed the limitations, risks, security and privacy concerns of performing an evaluation and management service by telephone and the availability of in person appointments. I also discussed with the patient that there may be a patient responsible charge related to this service. The patient expressed understanding and agreed to proceed.   History of Present Illness:  Over the past few days, has been more sluggish and balance has been off usually able to transfer himself to the toilet into his bed without significant difficulty but not the past few days.  No fever, cough , congestion, chills, CP, SOB  Taking amitza, so bowels moving regulary , no vomiting , No changes in speech and face   Taking the antibiotics for the infected cyst on back- the cyst has gone down  Appetite has been decreased -Has been eating  breakfast, but then not eating as much lunch or dinner   DM- CBG has been low 59,64 over the weekend Wife gave  45 units last night , still had fasting of 90 this morning  He cannot pinpoint anything particular that is causing him discomfort or pain     Observations/Objective: Telephone visit   Assessment and Plan:  Fatigue- likley factorial however he has history of CVA, cancer he has history of chronic kidney disease diabetes mellitus his infection has been treated on his back that he states that has improved.  It can be very difficult to tease out. Not want to go to the hospital be evaluated.  Wife does admit that he looks better today.  His appetite is also improved.    For the hypoglycemia symptoms-  decrease his insulin to 40 units continue all other oral  medications.  He will complete the antibiotics they both agree that the infected cyst has gone down he is not have any fever or chills.  He is trying to eat more today also recommend that he increase his fluids.  If his symptoms do not improve or he is unable to tolerate p.o. recommend that they call EMS to transport him to the hospital for evaluation.  Patient and wife in agreement with this plan.  Follow Up Instructions:    I discussed the assessment and treatment plan with the patient. The patient was provided an opportunity to ask questions and all were answered. The patient agreed with the plan and demonstrated an understanding of the instructions.   The patient was advised to call back or seek an in-person evaluation if the symptoms worsen or if the condition fails to improve as anticipated.  I provided 12 minutes of non-face-to-face time during this encounter. End time 2:38  Vic Blackbird, MD

## 2019-03-24 ENCOUNTER — Other Ambulatory Visit: Payer: Self-pay | Admitting: Family Medicine

## 2019-03-26 NOTE — Telephone Encounter (Signed)
Attempted to call pt several times. Left several messages. Awaiting for return call.

## 2019-03-29 ENCOUNTER — Other Ambulatory Visit: Payer: Self-pay | Admitting: Family Medicine

## 2019-04-06 ENCOUNTER — Other Ambulatory Visit: Payer: Self-pay | Admitting: Family Medicine

## 2019-04-16 ENCOUNTER — Other Ambulatory Visit: Payer: Self-pay | Admitting: "Endocrinology

## 2019-04-16 ENCOUNTER — Other Ambulatory Visit: Payer: Self-pay | Admitting: Family Medicine

## 2019-04-16 IMAGING — CT NM PET TUM IMG RESTAG (PS) SKULL BASE T - THIGH
8 series · 25 of 25 positions shown · non-contrast
Comparison: Multiple exams, including 03/21/2018

CLINICAL DATA: Subsequent treatment strategy for non-small cell
lung cancer with adrenal mass..

EXAM:
NUCLEAR MEDICINE PET SKULL BASE TO THIGH
TECHNIQUE: 13.2 mCi F-18 FDG was injected intravenously. Full-ring PET imaging
was performed from the skull base to thigh after the radiotracer. CT
data was obtained and used for attenuation correction and anatomic
localization.
Fasting blood glucose: 92 mg/dl

[Series 3: pet sk_thigh ac · axial · 5.0mm · 4.07mm/px · z∈[-1356,-356]mm · 6 of 251 slices shown]
[im 1/251]
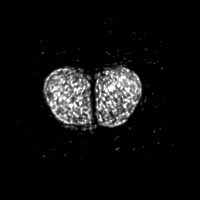
[im 51/251]
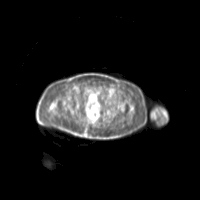
[im 101/251]
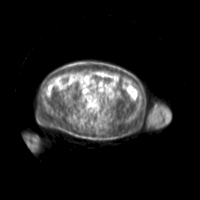
[im 151/251]
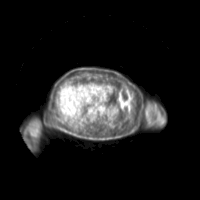
[im 201/251]
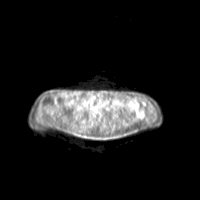
[im 251/251]
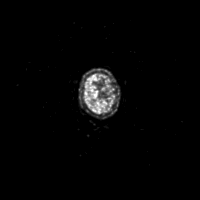

[Series 4: ct sk_thigh 5.0 b31f · axial · 5.0mm · 0.98mm/px · z∈[-1356,-356]mm · 5 of 251 slices shown]
[im 1/251]
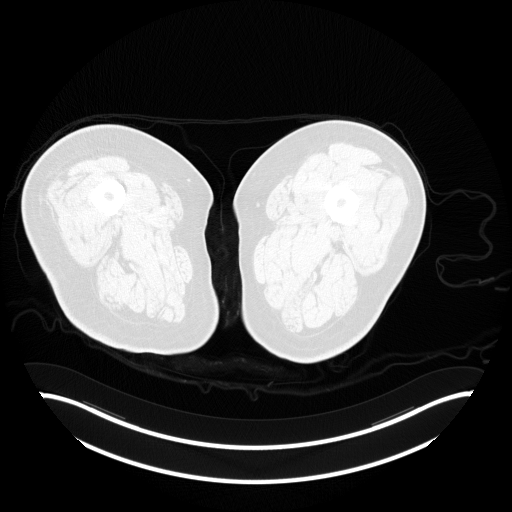
[im 63/251]
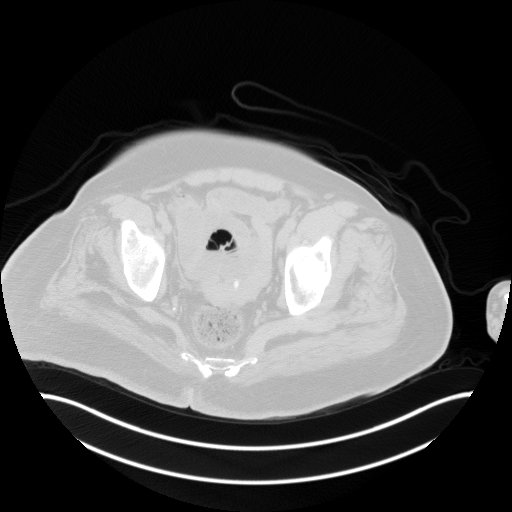
[im 126/251]
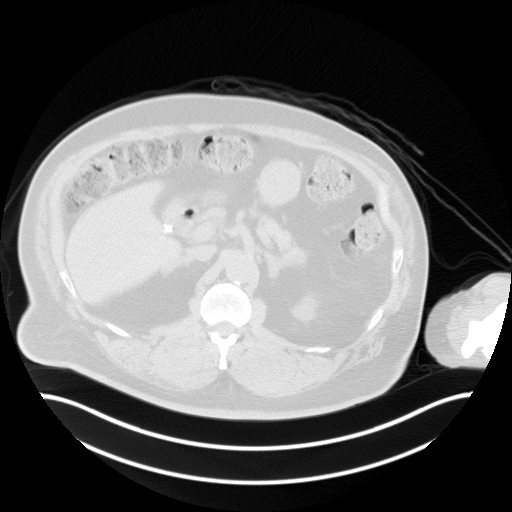
[im 188/251]
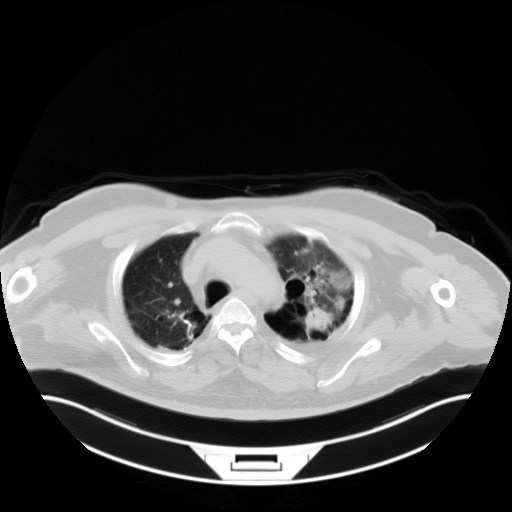
[im 251/251  brain]
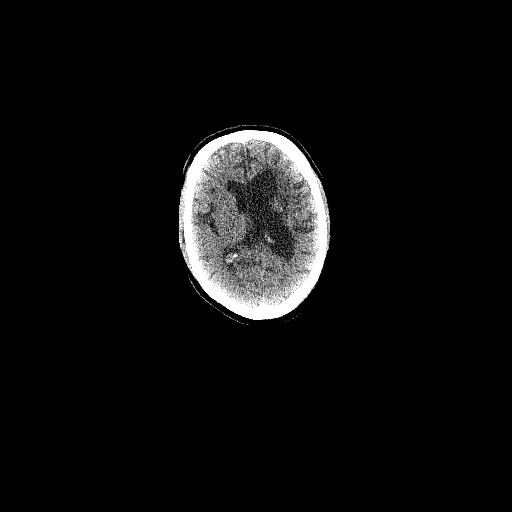

[Series 5: pet sk_thigh nac · axial · 5.0mm · 4.07mm/px · z∈[-1356,-356]mm · 5 of 251 slices shown]
[im 1/251]
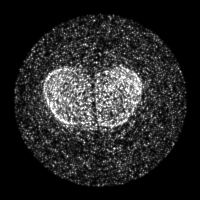
[im 63/251]
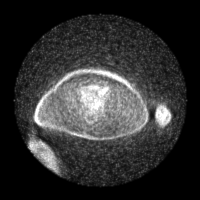
[im 126/251]
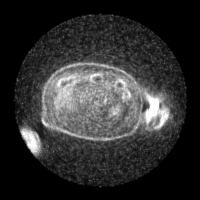
[im 188/251]
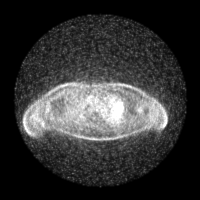
[im 251/251]
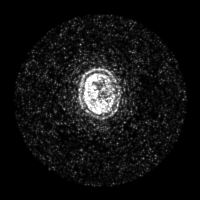

[Series 8: ct sk_thigh 5.0 (id) lung_bone · axial · 5.0mm · 0.62mm/px · 1 of 65 slices shown]
[im 1/65  bone]
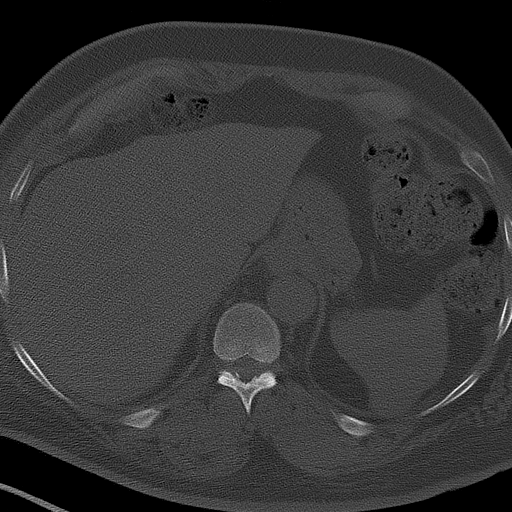

[Series 603: mip range · coronal · 2.07mm/px · 1 of 32 slices shown]
[im 1/32]
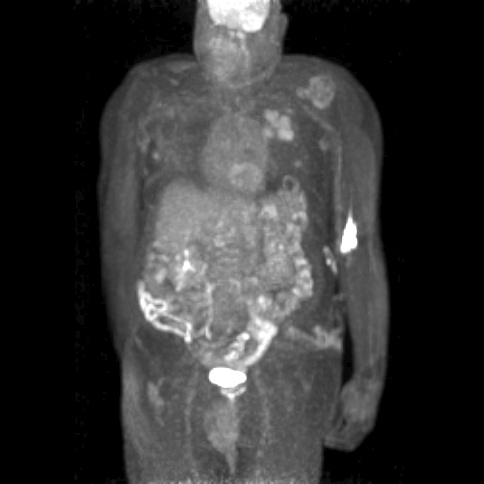

[Series 604: range-ct sk_thigh 5.0 (id)<alpha range> · 1 of 53 slices shown (1 of 2)]
[im 1/53]
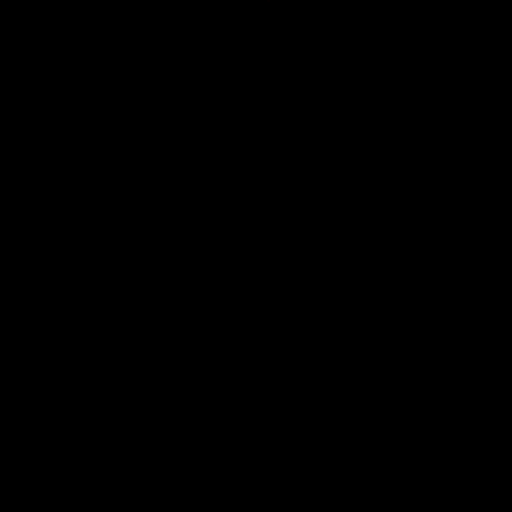

[Series 605: range-ct sk_thigh 5.0 (id)<alpha range> · 5 of 237 slices shown (2 of 2)]
[im 1/237]
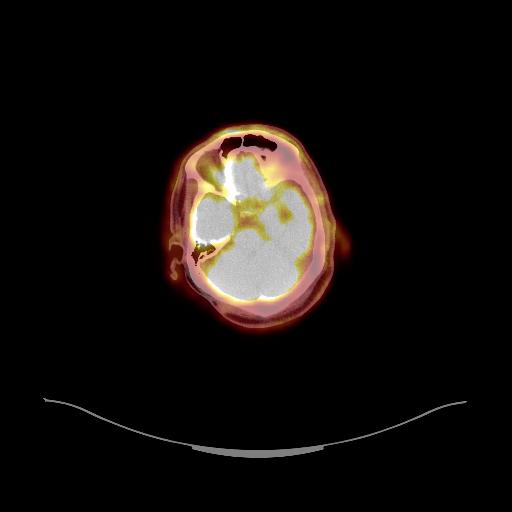
[im 60/237]
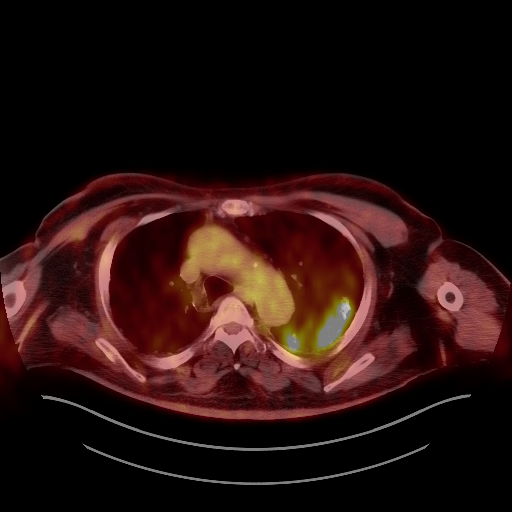
[im 119/237]
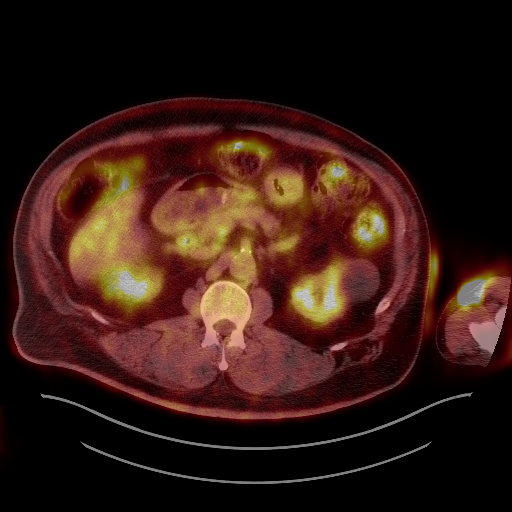
[im 178/237]
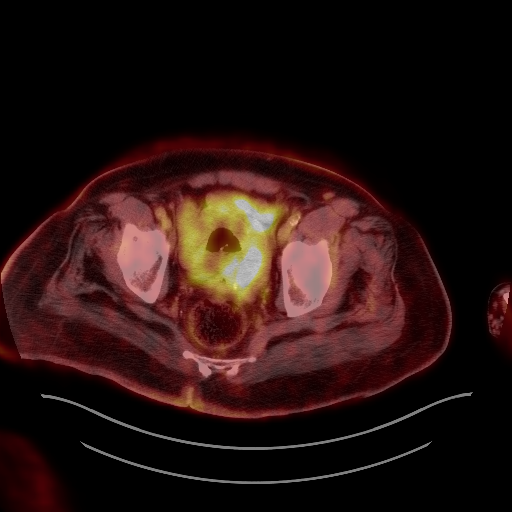
[im 237/237]
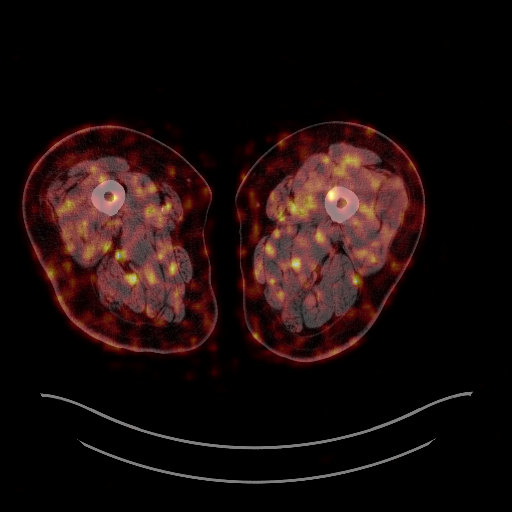

[Series 1219: results mm oncology reading · 5.0mm · 0.91mm/px · 1 of 6 slices shown]
[im 1/6]
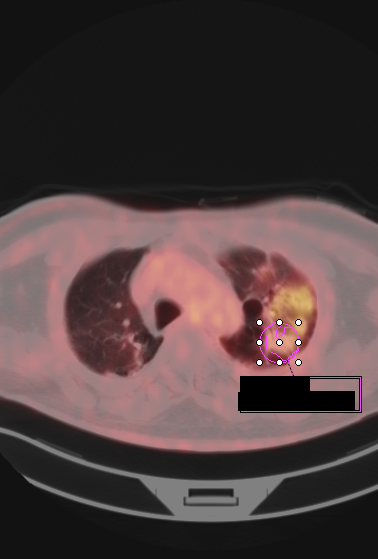

[25 of 25 positions shown; findings below may reference images not displayed]

FINDINGS: Mediastinal blood pool activity: SUV max

NECK: Hypo activity in the left cerebral hemisphere compatible with
prior infarct. There is stable relative hypo activity in the right
cerebellum compared to the left, significance uncertain. Physiologic
muscular activity in the neck. Stable hypodense lesion superficial
to the left parotid gland, photopenic likely a cyst.

Incidental CT findings: Suspected postoperative findings along the
inferior margin of the right parotid gland and overlying neck.
Atherosclerotic calcification of the common carotid arteries.

CHEST: Prior left upper lobe mass currently measures 2.9 by 1.8 cm
(formerly 3.9 by 2.6 cm) and has a maximum SUV of 3.5 (previously
14.8). However, there are surrounding airspace opacities in the left
upper lobe and superior segment left lower lobe probably from
radiation pneumonitis with a maximum SUV of 6.4.

Incidental CT findings: Emphysema. Coronary, aortic arch, and branch
vessel atherosclerotic vascular disease. Small type 1 hiatal hernia.

ABDOMEN/PELVIS: Stable thickening without overt nodularity of both
adrenal glands, left adrenal maximum SUV 5.1 (formerly 4.6), right
adrenal activity 4.3 (formerly 4.2).

Scattered bowel activity, likely physiologic. Photopenic renal
cysts.

Incidental CT findings: Prominent stool throughout the colon favors
constipation. Prostatomegaly. Aortoiliac atherosclerotic vascular
disease.

SKELETON: Accentuated activity along the margins of the left
glenohumeral joint along with severe osteoarthritis, appearance
compatible with local synovitis and inflammation.

Incidental note is made of radiopharmaceutical in the left
antecubital subcutaneous tissues. This is confirmed to be the site
of injection and likely represents local extravasation of
radiopharmaceutical.

Incidental CT findings: Trabecular coarsening in the L3 vertebral
body favoring hemangioma.
IMPRESSION: 1. Reduced size and activity in the left upper lobe mass, current
SUV 3.5 and previously 14.8. However, there surrounding airspace
opacities with maximum SUV up to 6.4, most likely from radiation
pneumonitis.
2. Both adrenal glands continue to demonstrate slight thickening
without overt nodularity and low-grade activity, maximum SUV on the
left 5.1 (formerly 4.6) and on the right 4.3 (formerly 4.2). Looking
back at the prior CT scans all the way back through 11/17/2003, the
adrenal glands have a similar contour. Accordingly, I favor the
appearance as being probably benign but meriting surveillance.
3. Hypo activity in the left cerebral hemisphere compatible with the
prior infarct. There is some stable relative hypo activity in the
right cerebellum compared to the left, significance uncertain, but
not changed from prior.
4. Other imaging findings of potential clinical significance: Aortic
Atherosclerosis (QNCIW-LYM.M) and Emphysema (QNCIW-ZL6.V). Coronary
atherosclerosis. Small type 1 hiatal hernia. Prominent stool
throughout the colon favors constipation. Prostatomegaly. Severe
left glenohumeral arthropathy.

## 2019-04-20 ENCOUNTER — Other Ambulatory Visit: Payer: Self-pay | Admitting: Family Medicine

## 2019-04-20 ENCOUNTER — Other Ambulatory Visit: Payer: Self-pay | Admitting: "Endocrinology

## 2019-05-08 ENCOUNTER — Telehealth: Payer: Self-pay | Admitting: *Deleted

## 2019-05-08 NOTE — Telephone Encounter (Signed)
CALLED PATIENT TO INFORM OF STAT LABS FOR 05-09-19 @ 11:30 AM  @ [place], AND HIS CT FOR 05-09-19 - ARRIVAL TIME- 12:15 PM @ [facility], NO RESTRICTIONS TO TEST, PT. TO GET RESULTS FROM DR. SQUIRE ON 05-10-19 VIA WEB-EX, SPOKE WITH PATIENT'S WIFE AND SHE IS AWARE OF THESE APPTS.

## 2019-05-09 ENCOUNTER — Ambulatory Visit
Admission: RE | Admit: 2019-05-09 | Discharge: 2019-05-09 | Disposition: A | Discharge status: Home / Self Care | Payer: Medicare Other | Source: Ambulatory Visit | Attending: Radiation Oncology | Admitting: Radiation Oncology

## 2019-05-09 ENCOUNTER — Other Ambulatory Visit: Payer: Self-pay | Admitting: *Deleted

## 2019-05-09 ENCOUNTER — Other Ambulatory Visit: Payer: Self-pay

## 2019-05-09 ENCOUNTER — Encounter (HOSPITAL_COMMUNITY): Payer: Self-pay

## 2019-05-09 ENCOUNTER — Ambulatory Visit (HOSPITAL_COMMUNITY)
Admission: RE | Admit: 2019-05-09 | Discharge: 2019-05-09 | Disposition: A | Discharge status: Home / Self Care | Payer: Medicare Other | Source: Ambulatory Visit | Attending: Radiation Oncology | Admitting: Radiation Oncology

## 2019-05-09 DIAGNOSIS — C3412 Malignant neoplasm of upper lobe, left bronchus or lung: Secondary | ICD-10-CM | POA: Insufficient documentation

## 2019-05-09 DIAGNOSIS — Z85118 Personal history of other malignant neoplasm of bronchus and lung: Secondary | ICD-10-CM | POA: Diagnosis not present

## 2019-05-09 DIAGNOSIS — C8190 Hodgkin lymphoma, unspecified, unspecified site: Secondary | ICD-10-CM | POA: Diagnosis not present

## 2019-05-09 HISTORY — DX: Malignant neoplasm of unspecified part of unspecified bronchus or lung: C34.90

## 2019-05-09 LAB — BUN & CREATININE (CHCC)
BUN: 18 mg/dL (ref 8–23)
Creatinine: 1.41 mg/dL — ABNORMAL HIGH (ref 0.61–1.24)
GFR, Est AFR Am: 57 mL/min — ABNORMAL LOW (ref >60)
GFR, Estimated: 49 mL/min — ABNORMAL LOW (ref >60)

## 2019-05-09 NOTE — Telephone Encounter (Signed)
Requested Prescriptions   Pending Prescriptions Disp Refills  . oxyCODONE-acetaminophen (PERCOCET) 7.5-325 MG tablet 180 tablet 0    Sig: Take 1 tablet by mouth every 4 (four) hours as needed.   Last OV 03/18/2019 Last written 02/11/2019

## 2019-05-10 ENCOUNTER — Ambulatory Visit
Admission: RE | Admit: 2019-05-10 | Discharge: 2019-05-10 | Disposition: A | Discharge status: Home / Self Care | Payer: Medicare Other | Source: Ambulatory Visit | Attending: Radiation Oncology | Admitting: Radiation Oncology

## 2019-05-10 DIAGNOSIS — C3412 Malignant neoplasm of upper lobe, left bronchus or lung: Secondary | ICD-10-CM

## 2019-05-10 DIAGNOSIS — Z08 Encounter for follow-up examination after completed treatment for malignant neoplasm: Secondary | ICD-10-CM | POA: Diagnosis not present

## 2019-05-10 DIAGNOSIS — Z85118 Personal history of other malignant neoplasm of bronchus and lung: Secondary | ICD-10-CM | POA: Diagnosis not present

## 2019-05-10 MED ORDER — OXYCODONE-ACETAMINOPHEN 7.5-325 MG PO TABS: 1.0000 | ORAL_TABLET | ORAL | 0 refills | Status: DC | PRN

## 2019-05-10 NOTE — Progress Notes (Addendum)
Radiation Oncology         [phone] ________________________________  Name: [name] MRN: [id number]  Date: 05/10/2019  DOB: March 31, 1946  Follow-Up  PHONE NOTE  CC: [name], MD  [name], MD  Diagnosis and Prior Radiotherapy:    ICD-10-CM   1. Malignant neoplasm of upper lobe of left lung (HCC) C34.12      Non small cell lung cancer - adenocarcinoma Cancer Staging Malignant neoplasm of upper lobe of left lung ([place]) Staging form: Lung, AJCC 8th Edition - Clinical: Stage IB (cT2a, cN0, cM0) - Signed by [name], MD on 04/06/2018  05/21/2018 - 05/30/2018: Lung, Left/ 54 Gy delivered in 3 fractions of 18 Gy  CHIEF COMPLAINT:  lung cancer  Narrative:  I spoke with patient and his wife by phone today due to pandemic precautions.  He has no new complaints, breathing is stable.  I reviewed his CT chest images and informed them he does not have signs of any cancer at this time.   ALLERGIES:  has No Known Allergies.  Meds: Current Outpatient Medications  Medication Sig Dispense Refill   allopurinol (ZYLOPRIM) 300 MG tablet TAKE 1 TABLET BY MOUTH EVERY DAY 90 tablet 0   AMITIZA 24 MCG capsule TAKE 1 CAPSULE(24 MCG) BY MOUTH TWICE DAILY WITH A MEAL 60 capsule 3   amLODipine (NORVASC) 10 MG tablet Take 1 tablet (10 mg total) by mouth daily. 90 tablet 1   Cholecalciferol (VITAMIN D3) 125 MCG (5000 UT) CAPS Take 1 capsule (5,000 Units total) by mouth daily. 90 capsule 0   cloNIDine (CATAPRES) 0.2 MG tablet TAKE 1 TABLET BY MOUTH THREE TIMES DAILY 270 tablet 0   doxycycline (VIBRA-TABS) 100 MG tablet Take 1 tablet (100 mg total) by mouth 2 (two) times daily. 14 tablet 0   famotidine (PEPCID) 20 MG tablet Take 1 tablet (20 mg total) by mouth daily. 15 tablet 0   furosemide (LASIX) 40 MG tablet TAKE 1 TABLET BY MOUTH EVERY DAY 90 tablet 0   gabapentin (NEURONTIN) 300 MG capsule TAKE 2 CAPSULES BY MOUTH TWICE DAILY(MAY TAKE AN ADDITIONAL 2 CAPSULES MIDDAY  AS NEEDED FOR PAIN) 540 capsule 0   Insulin Glargine (LANTUS SOLOSTAR) 100 UNIT/ML Solostar Pen Inject 50 Units into the skin at bedtime. 15 mL 2   lisinopril (PRINIVIL,ZESTRIL) 20 MG tablet TAKE 1 TABLET BY MOUTH DAILY 90 tablet 0   metFORMIN (GLUCOPHAGE) 500 MG tablet TAKE 1 TABLET BY MOUTH TWICE DAILY 180 tablet 0   metFORMIN (GLUCOPHAGE) 500 MG tablet TAKE 1 TABLET BY MOUTH TWICE DAILY 180 tablet 0   metoprolol succinate (TOPROL-XL) 50 MG 24 hr tablet TAKE 1 TABLET BY MOUTH EVERY DAY WITH OR IMMEDIATELY FOLLOWING A MEAL 90 tablet 0   OneTouch Delica Lancets 30S MISC USE AS DIRECTED FOUR TIMES DAILY 200 each 2   ONETOUCH VERIO test strip TEST FOUR TIMES DAILY 150 each 5   oxyCODONE-acetaminophen (PERCOCET) 7.5-325 MG tablet Take 1 tablet by mouth every 4 (four) hours as needed. 180 tablet 0   pantoprazole (PROTONIX) 40 MG tablet TAKE 1 TABLET BY MOUTH DAILY 90 tablet 0   potassium chloride SA (K-DUR,KLOR-CON) 20 MEQ tablet TAKE 1 TABLET(20 MEQ) BY MOUTH DAILY 90 tablet 1   simvastatin (ZOCOR) 20 MG tablet TAKE 1 TABLET BY MOUTH EVERY NIGHT AT BEDTIME 90 tablet 0   tamsulosin (FLOMAX) 0.4 MG CAPS capsule TAKE 1 CAPSULE BY MOUTH DAILY 90 capsule 0   tiZANidine (ZANAFLEX) 2 MG tablet  TAKE 1 TABLET(2 MG) BY MOUTH TWICE DAILY AS NEEDED 180 tablet 2   Triamcinolone Acetonide (TRIAMCINOLONE 0.1 % CREAM : EUCERIN) CREA Apply 1 application topically 2 (two) times daily as needed. (Patient taking differently: Apply 1 application topically 2 (two) times daily as needed for rash. ) 1 each 2   No current facility-administered medications for this encounter.     Physical Findings:    vitals were not taken for this visit.      Lab Findings: Lab Results  Component Value Date   WBC 6.4 12/13/2018   HGB 12.1 (L) 12/13/2018   HCT 40.8 12/13/2018   MCV 75.8 (L) 12/13/2018   PLT 256 12/13/2018    Radiographic Findings: Ct Chest Wo Contrast  Result Date: 05/09/2019 CLINICAL DATA:  Left  upper lobe non-small cell lung cancer status post radiation therapy completed 05/30/2018. Restaging. 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 EXAM: CT CHEST WITHOUT CONTRAST TECHNIQUE: Multidetector CT imaging of the chest was performed following the standard protocol without IV contrast. COMPARISON:  01/07/2019 chest CT.  08/30/2018 PET-CT. FINDINGS: Cardiovascular: Normal heart size. Stable small pericardial effusion. Left anterior descending coronary atherosclerosis. Atherosclerotic thoracic aorta with stable ectatic 4.4 cm ascending thoracic aorta. Stable dilated main pulmonary artery (3.7 cm diameter). Mediastinum/Nodes: Stable subcentimeter hypodense right thyroid lobe nodule. Unremarkable esophagus. No pathologically enlarged axillary, mediastinal or hilar lymph nodes, noting limited sensitivity for the detection of hilar adenopathy on this noncontrast study. Lungs/Pleura: No pneumothorax. No pleural effusion. Wedge resection suture line again noted in the posterior right upper lobe. Moderate centrilobular and paraseptal emphysema with diffuse bronchial wall thickening. Basilar right lower lobe 4 mm solid pulmonary nodule (series 7/image 127) is stable since 04/11/2018 chest CT, presumably benign. Treated posterior apical left upper lobe 2.0 x 1.7 cm nodule (series 7/image 42), previously 2.2 x 1.7 cm, slightly decreased, with stable surrounding sharply marginated bandlike consolidation, volume loss and distortion, compatible with radiation fibrosis. No acute consolidative airspace disease or new significant pulmonary nodules. Stable diffuse central bronchiectasis. Upper abdomen: Small hiatal hernia. Cholecystectomy. Simple 3.7 cm exophytic partially visualized upper left renal cyst. Colonic diverticulosis. Musculoskeletal: No aggressive appearing focal osseous lesions. Stable healed deformities within multiple posterior right ribs. Partially visualized chronic L2 vertebral compression fracture  status post vertebroplasty. IMPRESSION: 1. Stable treated left upper lobe pulmonary nodule with surrounding radiation fibrosis. No findings of local tumor recurrence. 2. No evidence of metastatic disease in the chest. 3. Stable ectatic 4.4 cm ascending thoracic aorta. Recommend annual imaging followup by CTA or MRA. This recommendation follows 2010 ACCF/AHA/AATS/ACR/ASA/SCA/SCAI/SIR/STS/SVM Guidelines for the Diagnosis and Management of Patients with Thoracic Aortic Disease. Circulation. 2010; 121: V291-B166. Aortic aneurysm NOS (ICD10-I71.9). Aortic Atherosclerosis (ICD10-I70.0) and Emphysema (ICD10-J43.9). Electronically Signed   By: Ilona Sorrel M.D.   On: 05/09/2019 14:47    Impression/Plan:  NSCLC, in remission after radiation treatment  I reviewed the recent chest CT scan with the patient and his wife today. No evidence of recurrence was seen.  Plan: Follow up in 6 months with a repeat Chest CT at that time.  This encounter was provided by telephone due to pandemic precautions; pt cannot use WebEx The patient has given verbal consent for this type of encounter and has been advised to only accept a meeting of this type in a secure network environment. The time spent during this encounter was under 5 minutes. The attendants for this meeting include [name]  and JASIEL BELISLE.  During the encounter, [name]  was located at [facility] Radiation Oncology Department.  [name] was located at home.     _____________________________________   [name], MD  This document serves as a record of services personally performed by [name], MD. It was created on her behalf by Wilburn Mylar, a trained medical scribe. The creation of this record is based on the scribe's personal observations and the provider's statements to them. This document has been checked and approved by the attending provider.

## 2019-05-22 ENCOUNTER — Other Ambulatory Visit: Payer: Self-pay | Admitting: Family Medicine

## 2019-05-31 ENCOUNTER — Other Ambulatory Visit: Payer: Self-pay | Admitting: Family Medicine

## 2019-06-06 DIAGNOSIS — E039 Hypothyroidism, unspecified: Secondary | ICD-10-CM | POA: Diagnosis not present

## 2019-06-06 DIAGNOSIS — N182 Chronic kidney disease, stage 2 (mild): Secondary | ICD-10-CM | POA: Diagnosis not present

## 2019-06-06 DIAGNOSIS — Z794 Long term (current) use of insulin: Secondary | ICD-10-CM | POA: Diagnosis not present

## 2019-06-06 DIAGNOSIS — E1122 Type 2 diabetes mellitus with diabetic chronic kidney disease: Secondary | ICD-10-CM | POA: Diagnosis not present

## 2019-06-07 LAB — COMPLETE METABOLIC PANEL WITH GFR
AG Ratio: 1.2 (calc) (ref 1.0–2.5)
ALT: 6 U/L — ABNORMAL LOW (ref 9–46)
AST: 9 U/L — ABNORMAL LOW (ref 10–35)
Albumin: 4.1 g/dL (ref 3.6–5.1)
Alkaline phosphatase (APISO): 81 U/L (ref 35–144)
BUN/Creatinine Ratio: 13 (calc) (ref 6–22)
BUN: 17 mg/dL (ref 7–25)
CO2: 24 mmol/L (ref 20–32)
Calcium: 10.8 mg/dL — ABNORMAL HIGH (ref 8.6–10.3)
Chloride: 103 mmol/L (ref 98–110)
Creat: 1.32 mg/dL — ABNORMAL HIGH (ref 0.70–1.18)
GFR, Est African American: 62 mL/min/{1.73_m2} (ref >=60)
GFR, Est Non African American: 54 mL/min/{1.73_m2} — ABNORMAL LOW (ref >=60)
GLOBULIN: 3.3 g/dL (ref 1.9–3.7)
Glucose, Bld: 76 mg/dL (ref 65–99)
Potassium: 4.4 mmol/L (ref 3.5–5.3)
Sodium: 137 mmol/L (ref 135–146)
Total Bilirubin: 0.3 mg/dL (ref 0.2–1.2)
Total Protein: 7.4 g/dL (ref 6.1–8.1)

## 2019-06-07 LAB — HEMOGLOBIN A1C
Hgb A1c MFr Bld: 6.3 % of total Hgb — ABNORMAL HIGH (ref <5.7)
Mean Plasma Glucose: 134 (calc)
eAG (mmol/L): 7.4 (calc)

## 2019-06-07 LAB — TSH: TSH: 2.39 mIU/L (ref 0.40–4.50)

## 2019-06-07 LAB — T4, FREE: Free T4: 1.6 ng/dL (ref 0.8–1.8)

## 2019-06-10 ENCOUNTER — Other Ambulatory Visit: Payer: Self-pay

## 2019-06-10 ENCOUNTER — Encounter: Payer: Self-pay | Admitting: Family Medicine

## 2019-06-10 ENCOUNTER — Ambulatory Visit (INDEPENDENT_AMBULATORY_CARE_PROVIDER_SITE_OTHER): Payer: Medicare Other | Admitting: Family Medicine

## 2019-06-10 VITALS — BP 136/74 | HR 64 | Temp 98.4°F | Resp 16

## 2019-06-10 DIAGNOSIS — E440 Moderate protein-calorie malnutrition: Secondary | ICD-10-CM | POA: Diagnosis not present

## 2019-06-10 DIAGNOSIS — Z794 Long term (current) use of insulin: Secondary | ICD-10-CM

## 2019-06-10 DIAGNOSIS — N183 Chronic kidney disease, stage 3 unspecified: Secondary | ICD-10-CM

## 2019-06-10 DIAGNOSIS — E1122 Type 2 diabetes mellitus with diabetic chronic kidney disease: Secondary | ICD-10-CM

## 2019-06-10 DIAGNOSIS — E46 Unspecified protein-calorie malnutrition: Secondary | ICD-10-CM | POA: Insufficient documentation

## 2019-06-10 DIAGNOSIS — R109 Unspecified abdominal pain: Secondary | ICD-10-CM | POA: Diagnosis not present

## 2019-06-10 DIAGNOSIS — I10 Essential (primary) hypertension: Secondary | ICD-10-CM

## 2019-06-10 DIAGNOSIS — C3412 Malignant neoplasm of upper lobe, left bronchus or lung: Secondary | ICD-10-CM

## 2019-06-10 DIAGNOSIS — G8929 Other chronic pain: Secondary | ICD-10-CM

## 2019-06-10 DIAGNOSIS — K219 Gastro-esophageal reflux disease without esophagitis: Secondary | ICD-10-CM

## 2019-06-10 DIAGNOSIS — R634 Abnormal weight loss: Secondary | ICD-10-CM

## 2019-06-10 DIAGNOSIS — N182 Chronic kidney disease, stage 2 (mild): Secondary | ICD-10-CM

## 2019-06-10 NOTE — Assessment & Plan Note (Signed)
Well controlled 

## 2019-06-10 NOTE — Progress Notes (Signed)
   Subjective:    Patient ID: [name], male    DOB: 03-Aug-1946, 73 y.o.   MRN: [id number]  Patient presents for Abd Pain (R sided abd pain- reflux) and Abscess to Back (draining pus )    Had CT of chest- for oncology no metasttic disease seen, but CT abdomen not done, He has chronic RUQ pain but now pain on left side, he has lost weight can tell physically looking at him, although unable to weigh as he can not stand on our scale. Has decreased appetite, often only 1 good meal a day. Denies pain with eating, no change in bowels, using amitiza Urine is dark yellow, does not drink a lot of fluids, but no dysuria, no odor. He had recent labs done with endocrinology I reviewed, A1C shows good control at 6.3% he has been having some hypoglycemia episodes, currently on  40units of Lantus   Recurrent cyst on back, was draining pus last week had tenderness, no fever  Review Of Systems:  GEN- denies fatigue, fever, +weight loss,weakness, recent illness HEENT- denies eye drainage, change in vision, nasal discharge, CVS- denies chest pain, palpitations RESP- denies SOB, cough, wheeze ABD- denies N/V, change in stools,+ abd pain GU- denies dysuria, hematuria, dribbling, incontinence MSK- denies joint pain, muscle aches, injury Neuro- denies headache, dizziness, syncope, seizure activity       Objective:    BP 136/74   Pulse 64   Temp 98.4 F (36.9 C) (Oral)   Resp 16   SpO2 98%  GEN- NAD, alert and oriented x3, sitting in wheelchair  HEENT- PERRL, EOMI, non injected sclera, pink conjunctiva, MMM, oropharynx clear Neck- Supple, no thyromegaly, no LAD CVS- RRR, no murmur RESP-CTAB ABD-NABS,soft,TTP RUQ and LUQ, no rebound, no guarding EXT- chronic non pitting  edema Pulses- Radial 2+        Assessment & Plan:      Problem List Items Addressed This Visit      Unprioritized   Chronic abdominal pain   CKD (chronic kidney disease), stage III (HCC)   Relevant Orders   CBC  with Differential/Platelet   Essential hypertension    Well controlled      GERD (gastroesophageal reflux disease)    On PPI no current GERD symptoms Has chronic abd pain but with is weight loss, would obtain CT abdomen to better evaluate difficult to examine in wheelchair Also check CBC      Malignant neoplasm of upper lobe of left lung (HCC)    Currently in remission, recent neg CT of chest       Protein-calorie malnutrition ([place])   Type 2 diabetes mellitus with stage 2 chronic kidney disease, with long-term current use of insulin (HCC) - Primary    Decrease lantus to  38 units Has appt endocrinology this week      Relevant Orders   CBC with Differential/Platelet    Other Visit Diagnoses    Weight loss       weight loss with protein calorie malnutrition recommend protein supplement, check CT, has both lung cancer and parotud gland cancer history      Note: This dictation was prepared with Dragon dictation along with smaller phrase technology. Any transcriptional errors that result from this process are unintentional.

## 2019-06-10 NOTE — Assessment & Plan Note (Signed)
Currently in remission, recent neg CT of chest

## 2019-06-10 NOTE — Assessment & Plan Note (Signed)
Decrease lantus to  38 units Has appt endocrinology this week

## 2019-06-10 NOTE — Patient Instructions (Addendum)
Decrease lantus to  38 units at bedtime  CT scan of abdomen to be done- mid morning  We will call with results  Antibiotics sent  Try the ensure  F/U 4 months

## 2019-06-10 NOTE — Assessment & Plan Note (Signed)
On PPI no current GERD symptoms Has chronic abd pain but with is weight loss, would obtain CT abdomen to better evaluate difficult to examine in wheelchair Also check CBC

## 2019-06-11 ENCOUNTER — Ambulatory Visit (INDEPENDENT_AMBULATORY_CARE_PROVIDER_SITE_OTHER): Payer: Medicare Other | Admitting: "Endocrinology

## 2019-06-11 ENCOUNTER — Encounter: Payer: Self-pay | Admitting: "Endocrinology

## 2019-06-11 DIAGNOSIS — E039 Hypothyroidism, unspecified: Secondary | ICD-10-CM | POA: Diagnosis not present

## 2019-06-11 DIAGNOSIS — N182 Chronic kidney disease, stage 2 (mild): Secondary | ICD-10-CM

## 2019-06-11 DIAGNOSIS — E782 Mixed hyperlipidemia: Secondary | ICD-10-CM

## 2019-06-11 DIAGNOSIS — E559 Vitamin D deficiency, unspecified: Secondary | ICD-10-CM | POA: Diagnosis not present

## 2019-06-11 DIAGNOSIS — Z794 Long term (current) use of insulin: Secondary | ICD-10-CM

## 2019-06-11 DIAGNOSIS — E1122 Type 2 diabetes mellitus with diabetic chronic kidney disease: Secondary | ICD-10-CM

## 2019-06-11 MED ORDER — LANTUS SOLOSTAR 100 UNIT/ML ~~LOC~~ SOPN: 38.0000 [IU] | PEN_INJECTOR | Freq: Every day | SUBCUTANEOUS | 2 refills | Status: DC

## 2019-06-11 NOTE — Progress Notes (Signed)
Endocrinology Telephone Visit Follow up Note -During COVID -19 Pandemic    Subjective:    Patient ID: [name], male    DOB: 1945/12/27,    Past Medical History:  Diagnosis Date  . Arthritis   . Asthma    "hx of"  . Barrett's esophagus    EGD 03/23/2011 & EGD 2/09 bx proven  . BPH (benign prostatic hyperplasia)   . Cancer of parotid gland ([place]) 11/23/12   Adenocarcinoma  . Chronic abdominal pain   . Chronic constipation   . Colon polyp 03/23/2011   tubular adenoma, Dr. Gala Romney  . 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 ([place]) 07/15/2011  . CVA (cerebral infarction) 1998   right sided deficit  . Delayed gastric emptying 2018  . Diverticulosis    TCS 03/23/11 pancolonic diverticula &TCS 5/08, pancolonic diverticula  . DM (diabetes mellitus) ([place])   . Edema of lower extremity 12/21/12   bilateral   . Esotropia of left eye   . GERD (gastroesophageal reflux disease)   . Glaucoma (increased eye pressure)   . Gout   . Gout   . Hemorrhagic colitis 06/06/2012.  [name] Hemorrhoids, internal 03/23/2011   tcs by Dr. Gala Romney  . Hepatitis    esosiniphilic, tx with prednisone  . Hiatal hernia   . History of radiation therapy 05/21/18- 05/30/18   Left Lung/ 54 Gy delivered in 3 fractions of 18 Gy. SBRT  . HTN (hypertension)   . Hx of radiation therapy 1974   right base of skull area-lymphoma  . Hyperlipidemia   . Lower facial weakness    Right  . Lymphoma ([place]) 1974   XRT at [facility], right base of skull area  . Neuropathy   . Non-small cell lung cancer ([place]) dx'd 04/26/18  . Peripheral edema    R>L legs  . Rash    chronic, recurrent, R>L legs  . Renal insufficiency   . Steatohepatitis    liver biopsy 2009  . Stroke [facility]) 1998   right hemiparesis/plegia   Past Surgical History:  Procedure Laterality Date  . BIOPSY  12/01/2016   Procedure: BIOPSY;  Surgeon: Daneil Dolin, MD;  Location: [facility];  Service:  Endoscopy;;  duodenum, gastric, esophagus  . CHOLECYSTECTOMY    . COLONOSCOPY  03/23/11   Dr. Gala Romney  pancolonic diverticula, hemorrhoids, tubular adenoma.. next tcs 03/2016  . COLONOSCOPY WITH PROPOFOL N/A 12/01/2016   inadequate bowel prep precluded exam  . ESOPHAGOGASTRODUODENOSCOPY  02/05/08   goblet cell metaplasia/negative for H.pylori  . ESOPHAGOGASTRODUODENOSCOPY  03/23/11   Dr. Gala Romney, barretts, hiatal hernia  . ESOPHAGOGASTRODUODENOSCOPY (EGD) WITH PROPOFOL N/A 12/01/2016   Dr. Gala Romney: Large amount of retained gastric contents precluded completion of the stomach. Mucosal changes were found in the stomach. Erosions and somewhat scalloped appearing mucosa present, reactive gastritis/no H pylori. Barrett's esophagus noted, no dysplasia on biopsy. Duodenal biopsies taken as well, benign, no evidence of eosinophilia.  . IR FLUORO GUIDED NEEDLE PLC ASPIRATION/INJECTION LOC  12/13/2018  . IR KYPHO LUMBAR INC FX REDUCE BONE BX UNI/BIL CANNULATION INC/IMAGING  12/13/2018  . IR RADIOLOGY PERIPHERAL GUIDED IV START  12/13/2018  . IR US GUIDE VASC ACCESS  LEFT  12/13/2018  . MASS BIOPSY  11/01/2012   Procedure: NECK MASS BIOPSY;  Surgeon: Ascencion Dike, MD;  Location: [facility];  Service: ENT;  Laterality: Right;  Excisional Bx Right Neck Mass; attempted external jugular cutdown of left side  . PAROTIDECTOMY  11/24/2012   Procedure: PAROTIDECTOMY;  Surgeon: Ascencion Dike, MD;  Location: [place];  Service: ENT;  Laterality: N/A;  Total parotidectomy  . PLEURECTOMY    . right lymph node removal Right    behind right ear  . Right video-assisted thoracic surgery, pleurectomy, and pleurodesis  2011  . VIDEO BRONCHOSCOPY WITH ENDOBRONCHIAL NAVIGATION N/A 04/26/2018   Procedure: VIDEO BRONCHOSCOPY WITH ENDOBRONCHIAL NAVIGATION;  Surgeon: Melrose Nakayama, MD;  Location: [place];  Service: Thoracic;  Laterality: N/A;   Social History   Socioeconomic History  . Marital status: Married    Spouse name: Not on file  .  Number of children: 1  . Years of education: Not on file  . Highest education level: Not on file  Occupational History  . Occupation: disabled  Social Needs  . Financial resource strain: Not on file  . Food insecurity    Worry: Not on file    Inability: Not on file  . Transportation needs    Medical: Not on file    Non-medical: Not on file  Tobacco Use  . Smoking status: Former Smoker    Packs/day: 3.00    Years: 25.00    Pack years: 75.00    Types: Cigarettes    Quit date: 03/01/1997    Years since quitting: 22.2  . Smokeless tobacco: Never Used  Substance and Sexual Activity  . Alcohol use: No  . Drug use: No  . Sexual activity: Not Currently  Lifestyle  . Physical activity    Days per week: Not on file    Minutes per session: Not on file  . Stress: Not on file  Relationships  . Social Herbalist on phone: Not on file    Gets together: Not on file    Attends religious service: Not on file    Active member of club or organization: Not on file    Attends meetings of clubs or organizations: Not on file    Relationship status: Not on file  Other Topics Concern  . Not on file  Social History Narrative  . Not on file   Outpatient Encounter Medications as of 06/11/2019  Medication Sig  . allopurinol (ZYLOPRIM) 300 MG tablet TAKE 1 TABLET BY MOUTH EVERY DAY  . AMITIZA 24 MCG capsule TAKE 1 CAPSULE(24 MCG) BY MOUTH TWICE DAILY WITH A MEAL  . amLODipine (NORVASC) 10 MG tablet Take 1 tablet (10 mg total) by mouth daily.  . Cholecalciferol (VITAMIN D3) 125 MCG (5000 UT) CAPS Take 1 capsule (5,000 Units total) by mouth daily.  . cloNIDine (CATAPRES) 0.2 MG tablet TAKE 1 TABLET BY MOUTH THREE TIMES DAILY  . doxycycline (VIBRA-TABS) 100 MG tablet Take 1 tablet (100 mg total) by mouth 2 (two) times daily. (Patient not taking: Reported on 06/10/2019)  . famotidine (PEPCID) 20 MG tablet Take 1 tablet (20 mg total) by mouth daily.  . furosemide (LASIX) 40 MG tablet TAKE 1  TABLET BY MOUTH EVERY DAY  . gabapentin (NEURONTIN) 300 MG capsule TAKE 2 CAPSULES BY MOUTH TWICE DAILY(MAY TAKE AN ADDITIONAL 2 CAPSULES MIDDAY AS NEEDED FOR PAIN)  . Insulin Glargine (LANTUS SOLOSTAR) 100 UNIT/ML Solostar Pen Inject 38 Units into  the skin at bedtime.  [name] lisinopril (PRINIVIL,ZESTRIL) 20 MG tablet TAKE 1 TABLET BY MOUTH DAILY  . metFORMIN (GLUCOPHAGE) 500 MG tablet TAKE 1 TABLET BY MOUTH TWICE DAILY  . metoprolol succinate (TOPROL-XL) 50 MG 24 hr tablet TAKE 1 TABLET BY MOUTH EVERY DAY WITH OR IMMEDIATELY FOLLOWING A MEAL  . OneTouch Delica Lancets 67M MISC USE AS DIRECTED FOUR TIMES DAILY  . ONETOUCH VERIO test strip TEST FOUR TIMES DAILY  . oxyCODONE-acetaminophen (PERCOCET) 7.5-325 MG tablet Take 1 tablet by mouth every 4 (four) hours as needed.  . pantoprazole (PROTONIX) 40 MG tablet TAKE 1 TABLET BY MOUTH DAILY  . potassium chloride SA (K-DUR,KLOR-CON) 20 MEQ tablet TAKE 1 TABLET(20 MEQ) BY MOUTH DAILY  . simvastatin (ZOCOR) 20 MG tablet TAKE 1 TABLET BY MOUTH EVERY NIGHT AT BEDTIME  . tamsulosin (FLOMAX) 0.4 MG CAPS capsule TAKE 1 CAPSULE BY MOUTH DAILY  . tiZANidine (ZANAFLEX) 2 MG tablet TAKE 1 TABLET(2 MG) BY MOUTH TWICE DAILY AS NEEDED  . Triamcinolone Acetonide (TRIAMCINOLONE 0.1 % CREAM : EUCERIN) CREA Apply 1 application topically 2 (two) times daily as needed. (Patient taking differently: Apply 1 application topically 2 (two) times daily as needed for rash. )  . [DISCONTINUED] Insulin Glargine (LANTUS SOLOSTAR) 100 UNIT/ML Solostar Pen Inject 50 Units into the skin at bedtime.  . [DISCONTINUED] metFORMIN (GLUCOPHAGE) 500 MG tablet TAKE 1 TABLET BY MOUTH TWICE DAILY   No facility-administered encounter medications on file as of 06/11/2019.    ALLERGIES: No Known Allergies VACCINATION STATUS: Immunization History  Administered Date(s) Administered  . Influenza Split 08/24/2012  . Influenza, High Dose Seasonal PF 10/31/2017, 10/02/2018  . Influenza,inj,Quad PF,6+  Mos 10/09/2013, 09/05/2014, 11/23/2015, 08/16/2016  . Pneumococcal Conjugate-13 01/07/2014  . Pneumococcal Polysaccharide-23 11/25/2012    Diabetes He presents for his follow-up diabetic visit. He has type 2 diabetes mellitus. Onset time: diagnosed approx at age 46. His disease course has been stable. There are no hypoglycemic associated symptoms. Pertinent negatives for hypoglycemia include no confusion, pallor or seizures. Pertinent negatives for diabetes include no fatigue, no polydipsia, no polyphagia, no polyuria and no weakness. Symptoms are stable. Risk factors for coronary artery disease include diabetes mellitus, dyslipidemia, hypertension, male sex, obesity, sedentary lifestyle and tobacco exposure. Current diabetic treatment includes insulin injections. He is following a generally unhealthy diet. When asked about meal planning, he reported none. He has had a previous visit with a dietitian. He never participates in exercise. There is no change in his home blood glucose trend. His breakfast blood glucose range is generally 140-180 mg/dl. His bedtime blood glucose range is generally 140-180 mg/dl. His overall blood glucose range is 140-180 mg/dl. An ACE inhibitor/angiotensin II receptor blocker is being taken.  Hyperlipidemia This is a chronic problem. The current episode started more than 1 year ago. The problem is uncontrolled. Recent lipid tests were reviewed and are variable. Exacerbating diseases include diabetes and obesity. Pertinent negatives include no myalgias. Risk factors for coronary artery disease include diabetes mellitus, dyslipidemia, hypertension, male sex and a sedentary lifestyle.    Review of systems: Limited as above.  Objective:    CMP  CMP Latest Ref Rng & Units 06/06/2019 05/09/2019 02/28/2019  Glucose 65 - 99 mg/dL 76 - 67  BUN 7 - 25 mg/dL 17 18 19   Creatinine 0.70 - 1.18 mg/dL 1.32(H) 1.41(H) 1.27(H)  Sodium 135 - 146 mmol/L 137 - 138  Potassium 3.5 - 5.3 mmol/L  4.4 - 4.4  Chloride 98 - 110 mmol/L 103 -  106  CO2 20 - 32 mmol/L 24 - 25  Calcium 8.6 - 10.3 mg/dL 10.8(H) - 10.4(H)  Total Protein 6.1 - 8.1 g/dL 7.4 - 6.5  Total Bilirubin 0.2 - 1.2 mg/dL 0.3 - 0.2  Alkaline Phos 38 - 126 U/L - - -  AST 10 - 35 U/L 9(L) - 10  ALT 9 - 46 U/L 6(L) - 7(L)    Diabetic Labs (most recent): Lab Results  Component Value Date   HGBA1C 6.3 (H) 06/06/2019   HGBA1C 6.4 (H) 02/28/2019   HGBA1C 6.3 (H) 10/25/2018   Lipid Panel     Component Value Date/Time   CHOL 151 11/01/2017 0828   TRIG 219 (H) 11/01/2017 0828   HDL 56 11/01/2017 0828   CHOLHDL 2.7 11/01/2017 0828   VLDL 66 (H) 05/04/2017 0931   LDLCALC 66 11/01/2017 0828     Assessment & Plan:   1. Type 2 diabetes mellitus with stage 2 chronic kidney disease and CVA -This is a telephone visit due to the coronavirus pandemic -His previsit labs show A1c stable at 6.3%.  His blood glucose readings are reported to be near target, his Lantus was lowered to 38 units by his PMD in the interim. -  His diabetes is complicated by recurrent CVA, stage 2-3 renal insufficiency, and he remains at extremely high risk for more acute and chronic complications of diabetes which include CAD, CVA, CKD, retinopathy, and neuropathy. -In May 2019, he was diagnosed with non-small cell lung cancer , status post multiple rounds of radiation treatment completed on May 30, 2018.    -I spoke with his wife who is his caretaker and advised to continue Lantus 38 units nightly associated with strict monitoring of blood glucose 2 times a day-daily before breakfast and at bedtime.  He will not need prandial insulin for now.  He is advised to continue metformin 500 mg p.o. twice daily-daily after breakfast and after supper.    -Patient is not a candidate for SGLT2 inhibitors due to CKD, not a suitable candidate for incretin therapy.  2) Lipids/HPL: Recent lipid panel showed controlled LDL at 66.  He is advised to continue  simvastatin 20 mg p.o. nightly.     3) hypothyroidism:  - I advised him to continue   levothyroxine 88 g by mouth every morning.  - We discussed about the correct intake of his thyroid hormone, on empty stomach at fasting, with water, separated by at least 30 minutes from breakfast and other medications,  and separated by more than 4 hours from calcium, iron, multivitamins, acid reflux medications (PPIs). -Patient is made aware of the fact that thyroid hormone replacement is needed for life, dose to be adjusted by periodic monitoring of thyroid function tests.   4) vitamin D deficiency -He is advised to continue vitamin D3 5000 units daily for the next 90 days.    5) hypercalcemia -stable with current calcium of 10.8.   Could be related to his non-small cell lung cancer.  His PTH is mildly elevated at 121, not significant in the face of CKD.  He will not require suppressive therapy for this mildly elevated PTH for now.    -He is also at risk of immobilization hypercalcemia due to his prolonged immobilization secondary to his stroke.  If his calcium continues to increase to above 11 mg/dL, would be considered for 1 dose of zoledronic acid infusion after next visit.   - I advised patient to maintain close follow up with Dr.  Little Flock for primary care needs.  - Patient Care Time Today:  25 min, of which >50% was spent in reviewing his  current and  previous labs/studies, previous treatments, glucose readings, and medications doses and developing a plan for long-term care based on the latest recommendations for standards of care.  [name] participated in the discussions, expressed understanding, and voiced agreement with the above plans.  All questions were answered to his satisfaction. he is encouraged to contact clinic should he have any questions or concerns prior to his return visit.   Follow up plan: Return in about 4 months (around 10/11/2019) for Follow up with Pre-visit Labs,  Meter, and Logs.  Glade Lloyd, MD Phone: [phone]  Fax: [phone]  This note was partially dictated with voice recognition software. Similar sounding words can be transcribed inadequately or may not  be corrected upon review.  06/11/2019, 1:04 PM

## 2019-06-12 DIAGNOSIS — N182 Chronic kidney disease, stage 2 (mild): Secondary | ICD-10-CM | POA: Diagnosis not present

## 2019-06-12 DIAGNOSIS — E1122 Type 2 diabetes mellitus with diabetic chronic kidney disease: Secondary | ICD-10-CM | POA: Diagnosis not present

## 2019-06-12 DIAGNOSIS — Z794 Long term (current) use of insulin: Secondary | ICD-10-CM | POA: Diagnosis not present

## 2019-06-12 DIAGNOSIS — N183 Chronic kidney disease, stage 3 (moderate): Secondary | ICD-10-CM | POA: Diagnosis not present

## 2019-06-12 LAB — CBC WITH DIFFERENTIAL/PLATELET
Absolute Monocytes: 385 cells/uL (ref 200–950)
Basophils Absolute: 57 cells/uL (ref 0–200)
Basophils Relative: 0.7 %
Eosinophils Absolute: 582 cells/uL — ABNORMAL HIGH (ref 15–500)
Eosinophils Relative: 7.1 %
HCT: 37.9 % — ABNORMAL LOW (ref 38.5–50.0)
Hemoglobin: 12 g/dL — ABNORMAL LOW (ref 13.2–17.1)
Lymphs Abs: 976 cells/uL (ref 850–3900)
MCH: 23.1 pg — ABNORMAL LOW (ref 27.0–33.0)
MCHC: 31.7 g/dL — ABNORMAL LOW (ref 32.0–36.0)
MCV: 73 fL — ABNORMAL LOW (ref 80.0–100.0)
Monocytes Relative: 4.7 %
Neutro Abs: 6199 cells/uL (ref 1500–7800)
Neutrophils Relative %: 75.6 %
Platelets: 325 10*3/uL (ref 140–400)
RBC: 5.19 10*6/uL (ref 4.20–5.80)
RDW: 17.4 % — ABNORMAL HIGH (ref 11.0–15.0)
Total Lymphocyte: 11.9 %
WBC: 8.2 10*3/uL (ref 3.8–10.8)

## 2019-06-13 ENCOUNTER — Ambulatory Visit: Payer: Medicare Other | Admitting: "Endocrinology

## 2019-06-13 ENCOUNTER — Other Ambulatory Visit: Payer: Medicare Other

## 2019-06-13 ENCOUNTER — Other Ambulatory Visit: Payer: Self-pay

## 2019-06-13 DIAGNOSIS — Z20822 Contact with and (suspected) exposure to covid-19: Secondary | ICD-10-CM

## 2019-06-13 DIAGNOSIS — R6889 Other general symptoms and signs: Secondary | ICD-10-CM | POA: Diagnosis not present

## 2019-06-18 ENCOUNTER — Other Ambulatory Visit: Payer: Self-pay | Admitting: *Deleted

## 2019-06-18 NOTE — Patient Outreach (Signed)
Referral received from [employer] high risk list, outreach call to pt for screening, no answer to telephone, left voicemail requesting return phone call, mailed unsuccessful outreach letter to pt home.  PLAN Outreach pt in 3-4 business days  Jacqlyn Larsen [facility], [place] [phone]

## 2019-06-19 LAB — NOVEL CORONAVIRUS, NAA: SARS-CoV-2, NAA: NOT DETECTED

## 2019-06-21 ENCOUNTER — Other Ambulatory Visit: Payer: Self-pay | Admitting: *Deleted

## 2019-06-21 NOTE — Patient Outreach (Signed)
Outreach call to pt (2nd attempt) for screening, no answer to telephone, left voicemail requesting return phone call.  PLAN Outreach pt in 3-4 business days  Jacqlyn Larsen [facility], [place] [phone]

## 2019-06-23 ENCOUNTER — Other Ambulatory Visit: Payer: Self-pay | Admitting: Family Medicine

## 2019-06-26 ENCOUNTER — Other Ambulatory Visit: Payer: Self-pay | Admitting: *Deleted

## 2019-06-26 NOTE — Patient Outreach (Signed)
Outreach call- 3rd attempt to pt for screening, no answer to telephone, left voicemail requesting return phone call.  PLAN Close case in 3-4 business days  Jacqlyn Larsen [facility], [place] Coordinator [phone]

## 2019-06-27 ENCOUNTER — Ambulatory Visit (HOSPITAL_COMMUNITY)
Admission: RE | Admit: 2019-06-27 | Discharge: 2019-06-27 | Disposition: A | Discharge status: Home / Self Care | Payer: Medicare Other | Source: Ambulatory Visit | Attending: Family Medicine | Admitting: Family Medicine

## 2019-06-27 ENCOUNTER — Other Ambulatory Visit: Payer: Self-pay

## 2019-06-27 DIAGNOSIS — C3412 Malignant neoplasm of upper lobe, left bronchus or lung: Secondary | ICD-10-CM | POA: Insufficient documentation

## 2019-06-27 DIAGNOSIS — K439 Ventral hernia without obstruction or gangrene: Secondary | ICD-10-CM | POA: Diagnosis not present

## 2019-06-27 DIAGNOSIS — K5730 Diverticulosis of large intestine without perforation or abscess without bleeding: Secondary | ICD-10-CM | POA: Diagnosis not present

## 2019-06-27 DIAGNOSIS — K429 Umbilical hernia without obstruction or gangrene: Secondary | ICD-10-CM | POA: Diagnosis not present

## 2019-06-27 DIAGNOSIS — G8929 Other chronic pain: Secondary | ICD-10-CM | POA: Diagnosis not present

## 2019-06-27 DIAGNOSIS — R634 Abnormal weight loss: Secondary | ICD-10-CM | POA: Diagnosis not present

## 2019-06-27 DIAGNOSIS — R109 Unspecified abdominal pain: Secondary | ICD-10-CM | POA: Diagnosis not present

## 2019-07-01 ENCOUNTER — Other Ambulatory Visit: Payer: Self-pay | Admitting: *Deleted

## 2019-07-01 NOTE — Patient Outreach (Signed)
Unable to reach pt by telephone after multiple attempts.  Case closed  Jacqlyn Larsen [facility], [place] Coordinator [phone]

## 2019-07-08 ENCOUNTER — Other Ambulatory Visit: Payer: Self-pay | Admitting: Family Medicine

## 2019-07-15 ENCOUNTER — Emergency Department (HOSPITAL_COMMUNITY): Payer: Medicare Other

## 2019-07-15 ENCOUNTER — Emergency Department (HOSPITAL_COMMUNITY)
Admission: EM | Admit: 2019-07-15 | Discharge: 2019-07-15 | Disposition: A | Discharge status: Home / Self Care | Payer: Medicare Other | Attending: Emergency Medicine | Admitting: Emergency Medicine

## 2019-07-15 ENCOUNTER — Other Ambulatory Visit: Payer: Self-pay

## 2019-07-15 ENCOUNTER — Encounter (HOSPITAL_COMMUNITY): Payer: Self-pay

## 2019-07-15 DIAGNOSIS — G8191 Hemiplegia, unspecified affecting right dominant side: Secondary | ICD-10-CM | POA: Diagnosis not present

## 2019-07-15 DIAGNOSIS — Z79899 Other long term (current) drug therapy: Secondary | ICD-10-CM | POA: Diagnosis not present

## 2019-07-15 DIAGNOSIS — R509 Fever, unspecified: Secondary | ICD-10-CM | POA: Diagnosis not present

## 2019-07-15 DIAGNOSIS — I129 Hypertensive chronic kidney disease with stage 1 through stage 4 chronic kidney disease, or unspecified chronic kidney disease: Secondary | ICD-10-CM | POA: Insufficient documentation

## 2019-07-15 DIAGNOSIS — I10 Essential (primary) hypertension: Secondary | ICD-10-CM | POA: Diagnosis not present

## 2019-07-15 DIAGNOSIS — Z20828 Contact with and (suspected) exposure to other viral communicable diseases: Secondary | ICD-10-CM | POA: Insufficient documentation

## 2019-07-15 DIAGNOSIS — R300 Dysuria: Secondary | ICD-10-CM | POA: Diagnosis not present

## 2019-07-15 DIAGNOSIS — R1030 Lower abdominal pain, unspecified: Secondary | ICD-10-CM | POA: Diagnosis not present

## 2019-07-15 DIAGNOSIS — R05 Cough: Secondary | ICD-10-CM | POA: Diagnosis not present

## 2019-07-15 DIAGNOSIS — N183 Chronic kidney disease, stage 3 (moderate): Secondary | ICD-10-CM | POA: Diagnosis not present

## 2019-07-15 DIAGNOSIS — E1122 Type 2 diabetes mellitus with diabetic chronic kidney disease: Secondary | ICD-10-CM | POA: Insufficient documentation

## 2019-07-15 DIAGNOSIS — N281 Cyst of kidney, acquired: Secondary | ICD-10-CM | POA: Diagnosis not present

## 2019-07-15 DIAGNOSIS — Z794 Long term (current) use of insulin: Secondary | ICD-10-CM | POA: Insufficient documentation

## 2019-07-15 DIAGNOSIS — Z87891 Personal history of nicotine dependence: Secondary | ICD-10-CM | POA: Insufficient documentation

## 2019-07-15 DIAGNOSIS — J189 Pneumonia, unspecified organism: Secondary | ICD-10-CM | POA: Diagnosis not present

## 2019-07-15 DIAGNOSIS — K0889 Other specified disorders of teeth and supporting structures: Secondary | ICD-10-CM

## 2019-07-15 DIAGNOSIS — R531 Weakness: Secondary | ICD-10-CM | POA: Diagnosis not present

## 2019-07-15 DIAGNOSIS — J45909 Unspecified asthma, uncomplicated: Secondary | ICD-10-CM | POA: Insufficient documentation

## 2019-07-15 LAB — CBC WITH DIFFERENTIAL/PLATELET
Abs Immature Granulocytes: 0.03 10*3/uL (ref 0.00–0.07)
Basophils Absolute: 0 10*3/uL (ref 0.0–0.1)
Basophils Relative: 0 %
Eosinophils Absolute: 0.4 10*3/uL (ref 0.0–0.5)
Eosinophils Relative: 4 %
HCT: 34.2 % — ABNORMAL LOW (ref 39.0–52.0)
Hemoglobin: 10.4 g/dL — ABNORMAL LOW (ref 13.0–17.0)
Immature Granulocytes: 0 %
Lymphocytes Relative: 6 %
Lymphs Abs: 0.6 10*3/uL — ABNORMAL LOW (ref 0.7–4.0)
MCH: 23.3 pg — ABNORMAL LOW (ref 26.0–34.0)
MCHC: 30.4 g/dL (ref 30.0–36.0)
MCV: 76.5 fL — ABNORMAL LOW (ref 80.0–100.0)
Monocytes Absolute: 0.6 10*3/uL (ref 0.1–1.0)
Monocytes Relative: 6 %
Neutro Abs: 8.1 10*3/uL — ABNORMAL HIGH (ref 1.7–7.7)
Neutrophils Relative %: 84 %
Platelets: 195 10*3/uL (ref 150–400)
RBC: 4.47 MIL/uL (ref 4.22–5.81)
RDW: 17.4 % — ABNORMAL HIGH (ref 11.5–15.5)
WBC: 9.8 10*3/uL (ref 4.0–10.5)
nRBC: 0 % (ref 0.0–0.2)

## 2019-07-15 LAB — COMPREHENSIVE METABOLIC PANEL
ALT: 11 U/L (ref 0–44)
AST: 17 U/L (ref 15–41)
Albumin: 3.9 g/dL (ref 3.5–5.0)
Alkaline Phosphatase: 84 U/L (ref 38–126)
Anion gap: 11 (ref 5–15)
BUN: 24 mg/dL — ABNORMAL HIGH (ref 8–23)
CO2: 24 mmol/L (ref 22–32)
Calcium: 10.8 mg/dL — ABNORMAL HIGH (ref 8.9–10.3)
Chloride: 102 mmol/L (ref 98–111)
Creatinine, Ser: 1.74 mg/dL — ABNORMAL HIGH (ref 0.61–1.24)
GFR calc Af Amer: 44 mL/min — ABNORMAL LOW (ref >60)
GFR calc non Af Amer: 38 mL/min — ABNORMAL LOW (ref >60)
Glucose, Bld: 111 mg/dL — ABNORMAL HIGH (ref 70–99)
Potassium: 4.9 mmol/L (ref 3.5–5.1)
Sodium: 137 mmol/L (ref 135–145)
Total Bilirubin: 0.6 mg/dL (ref 0.3–1.2)
Total Protein: 8.3 g/dL — ABNORMAL HIGH (ref 6.5–8.1)

## 2019-07-15 LAB — URINALYSIS, ROUTINE W REFLEX MICROSCOPIC
Bacteria, UA: NONE SEEN
Bilirubin Urine: NEGATIVE
Glucose, UA: NEGATIVE mg/dL
Hgb urine dipstick: NEGATIVE
Ketones, ur: NEGATIVE mg/dL
Leukocytes,Ua: NEGATIVE
Nitrite: NEGATIVE
Protein, ur: 100 mg/dL — AB
Specific Gravity, Urine: 1.009 (ref 1.005–1.030)
pH: 7 (ref 5.0–8.0)

## 2019-07-15 LAB — LACTIC ACID, PLASMA
Lactic Acid, Venous: 2.4 mmol/L (ref 0.5–1.9)
Lactic Acid, Venous: 1.3 mmol/L (ref 0.5–1.9)

## 2019-07-15 LAB — PROTIME-INR
INR: 1.1 (ref 0.8–1.2)
Prothrombin Time: 13.7 seconds (ref 11.4–15.2)

## 2019-07-15 LAB — SARS CORONAVIRUS 2 BY RT PCR (HOSPITAL ORDER, PERFORMED IN ~~LOC~~ HOSPITAL LAB): SARS Coronavirus 2: NEGATIVE

## 2019-07-15 LAB — APTT: aPTT: 32 seconds (ref 24–36)

## 2019-07-15 MED ORDER — SODIUM CHLORIDE 0.9 % IV SOLN
1000.0000 mL | INTRAVENOUS | Status: DC
  Administered 2019-07-15: 1000 mL via INTRAVENOUS

## 2019-07-15 MED ORDER — ACETAMINOPHEN 500 MG PO TABS
1000.0000 mg | ORAL_TABLET | Freq: Once | ORAL | Status: AC
  Administered 2019-07-15: 1000 mg via ORAL
  Filled 2019-07-15: qty 2

## 2019-07-15 MED ORDER — AMOXICILLIN-POT CLAVULANATE 875-125 MG PO TABS: 1.0000 | ORAL_TABLET | Freq: Two times a day (BID) | ORAL | 0 refills | Status: DC

## 2019-07-15 MED ORDER — SODIUM CHLORIDE 0.9 % IV BOLUS
1000.0000 mL | Freq: Once | INTRAVENOUS | Status: AC
  Administered 2019-07-15: 1000 mL via INTRAVENOUS

## 2019-07-15 MED ORDER — SODIUM CHLORIDE 0.9 % IV SOLN
1.0000 g | INTRAVENOUS | Status: DC
  Administered 2019-07-15: 1 g via INTRAVENOUS
  Filled 2019-07-15: qty 10

## 2019-07-15 MED ORDER — MORPHINE SULFATE (PF) 4 MG/ML IV SOLN: 4.0000 mg | Freq: Once | INTRAVENOUS | Status: DC

## 2019-07-15 NOTE — Discharge Instructions (Addendum)
Take Augmentin until completed.  Take Tylenol as prescribed over-the-counter, as needed for fever.  Make sure to drink plenty of water.  Please follow-up with your doctor in 2 days for recheck.  Please follow-up with the dentist as planned.  Please return to the emergency department if you develop any new or worsening symptoms including worsening pain, passing out, or any other new or concerning symptoms.

## 2019-07-15 NOTE — ED Notes (Signed)
Unable to obtain blood cultures after multiple attempts by both lab and RN. Antibiotics started in order to meet sepsis protocol.

## 2019-07-15 NOTE — ED Notes (Signed)
Date and time results received: 07/15/19 2:56 PM  (use smartphrase ".now" to insert current time)  Test: Lactic Critical Value: 2.4  Name of Provider Notified: Eliezer [name] PA  Orders Received? Or Actions Taken?: Orders Received - See Orders for details

## 2019-07-15 NOTE — ED Provider Notes (Signed)
[facility] EMERGENCY DEPARTMENT Provider Note   CSN: [id number] Arrival date & time: 07/15/19  1311    History   Chief Complaint Chief Complaint  Patient presents with   Fever    HPI [name] is a 73 y.o. male with history of stroke with right-sided hemiparesis, renal insufficiency, hypertension, diabetes, lung cancer who presents with a 1 day history of fever, cough, and dysuria.  Patient denies any chest pain or shortness of breath.  He has had some associated lower abdominal pain.  His urine has been darker than normal.  He denies any back pain.  He denies any loss of taste or smell.  He denies any known sick contacts.  He took Tylenol at home for fever.     HPI  Past Medical History:  Diagnosis Date   Arthritis    Asthma    "hx of"   Barrett's esophagus    EGD 03/23/2011 & EGD 2/09 bx proven   BPH (benign prostatic hyperplasia)    Cancer of parotid gland ([place]) 11/23/12   Adenocarcinoma   Chronic abdominal pain    Chronic constipation    Colon polyp 03/23/2011   tubular adenoma, Dr. Gala Romney   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 ([place]) 07/15/2011   CVA (cerebral infarction) 1998   right sided deficit   Delayed gastric emptying 2018   Diverticulosis    TCS 03/23/11 pancolonic diverticula &TCS 5/08, pancolonic diverticula   DM (diabetes mellitus) ([place])    Edema of lower extremity 12/21/12   bilateral    Esotropia of left eye    GERD (gastroesophageal reflux disease)    Glaucoma (increased eye pressure)    Gout    Gout    Hemorrhagic colitis 06/06/2012.   Hemorrhoids, internal 03/23/2011   tcs by Dr. Gala Romney   Hepatitis    esosiniphilic, tx with prednisone   Hiatal hernia    History of radiation therapy 05/21/18- 05/30/18   Left Lung/ 54 Gy delivered in 3 fractions of 18 Gy. SBRT   HTN (hypertension)    Hx of radiation therapy 1974   right base of skull area-lymphoma   Hyperlipidemia    Lower facial weakness    Right    Lymphoma ([place]) 1974   XRT at [facility], right base of skull area   Neuropathy    Non-small cell lung cancer ([place]) dx'd 04/26/18   Peripheral edema    R>L legs   Rash    chronic, recurrent, R>L legs   Renal insufficiency    Steatohepatitis    liver biopsy 2009   Stroke [facility]) 1998   right hemiparesis/plegia    Patient Active Problem List   Diagnosis Date Noted   Protein-calorie malnutrition ([place]) 06/10/2019   Vitamin D deficiency 03/07/2019   Hypogonadism, male 07/26/2018   Gynecomastia, male 06/27/2018   Malignant neoplasm of upper lobe of left lung ([place]) 04/06/2018   History of parotid cancer 02/07/2018   RUQ pain 11/09/2016   Colon adenomas 11/09/2016   Primary hypothyroidism 09/09/2015   Chronic constipation 05/22/2015   Candidiasis, intertrigo 09/05/2014   Arthritis, shoulder region 12/10/2013   Impetigo bullosa 10/23/2013   Intertrigo 10/12/2013   Chronic left shoulder pain 09/25/2013   Muscle weakness (generalized) 09/25/2013   Hypercalcemia 08/08/2013   CKD (chronic kidney disease), stage III ([place]) 08/07/2013   Adhesive capsulitis of left shoulder 05/14/2013   Steatohepatitis    Diverticulosis    GERD (gastroesophageal reflux disease)    History of  CVA (cerebrovascular accident)    Hiatal hernia    Mixed hyperlipidemia    Glaucoma (increased eye pressure)    Esotropia of left eye    BPH (benign prostatic hyperplasia)    Hx of radiation therapy    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   Epidermal inclusion cyst 10/05/2012   Neck pain 08/26/2012   Chronic abdominal pain 08/26/2012   Leg edema 08/24/2012   Hemiplegia of dominant side as late effect following cerebrovascular disease ([place]) 06/06/2012   Diarrhea 04/17/2012   Obesity 04/17/2012   Diabetic neuropathy ([place]) 02/13/2012   Recurrent boils 01/12/2012   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 ([place]) 07/15/2011   Hemorrhoids, internal  03/23/2011   Hepatitis 03/02/2011   EOSINOPHILIA 12/23/2009   Type 2 diabetes mellitus with stage 2 chronic kidney disease, with long-term current use of insulin ([place]) 01/05/2009   Gout, unspecified 01/05/2009   Essential hypertension 01/05/2009   BARRETTS ESOPHAGUS 01/05/2009   DIVERTICULOSIS OF COLON 01/05/2009   History of lymphoma 01/05/2009    Past Surgical History:  Procedure Laterality Date   BIOPSY  12/01/2016   Procedure: BIOPSY;  Surgeon: Daneil Dolin, MD;  Location: [facility];  Service: Endoscopy;;  duodenum, gastric, esophagus   CHOLECYSTECTOMY     COLONOSCOPY  03/23/11   Dr. Gala Romney  pancolonic diverticula, hemorrhoids, tubular adenoma.. next tcs 03/2016   COLONOSCOPY WITH PROPOFOL N/A 12/01/2016   inadequate bowel prep precluded exam   ESOPHAGOGASTRODUODENOSCOPY  02/05/08   goblet cell metaplasia/negative for H.pylori   ESOPHAGOGASTRODUODENOSCOPY  03/23/11   Dr. Gala Romney, barretts, hiatal hernia   ESOPHAGOGASTRODUODENOSCOPY (EGD) WITH PROPOFOL N/A 12/01/2016   Dr. Gala Romney: Large amount of retained gastric contents precluded completion of the stomach. Mucosal changes were found in the stomach. Erosions and somewhat scalloped appearing mucosa present, reactive gastritis/no H pylori. Barrett's esophagus noted, no dysplasia on biopsy. Duodenal biopsies taken as well, benign, no evidence of eosinophilia.   IR FLUORO GUIDED NEEDLE PLC ASPIRATION/INJECTION LOC  12/13/2018   IR KYPHO LUMBAR INC FX REDUCE BONE BX UNI/BIL CANNULATION INC/IMAGING  12/13/2018   IR RADIOLOGY PERIPHERAL GUIDED IV START  12/13/2018   IR US GUIDE VASC ACCESS LEFT  12/13/2018   MASS BIOPSY  11/01/2012   Procedure: NECK MASS BIOPSY;  Surgeon: Ascencion Dike, MD;  Location: [facility] ORS;  Service: ENT;  Laterality: Right;  Excisional Bx Right Neck Mass; attempted external jugular cutdown of left side   PAROTIDECTOMY  11/24/2012   Procedure: PAROTIDECTOMY;  Surgeon: Ascencion Dike, MD;  Location: [place];   Service: ENT;  Laterality: N/A;  Total parotidectomy   PLEURECTOMY     right lymph node removal Right    behind right ear   Right video-assisted thoracic surgery, pleurectomy, and pleurodesis  2011   VIDEO BRONCHOSCOPY WITH ENDOBRONCHIAL NAVIGATION N/A 04/26/2018   Procedure: VIDEO BRONCHOSCOPY WITH ENDOBRONCHIAL NAVIGATION;  Surgeon: Melrose Nakayama, MD;  Location: [facility] OR;  Service: Thoracic;  Laterality: N/A;        Home Medications    Prior to Admission medications   Medication Sig Start Date End Date Taking? Authorizing Provider  allopurinol (ZYLOPRIM) 300 MG tablet TAKE 1 TABLET BY MOUTH EVERY DAY Patient taking differently: Take 300 mg by mouth every morning.  06/24/19  Yes [place], [name], MD  AMITIZA 24 MCG capsule TAKE 1 CAPSULE(24 MCG) BY MOUTH TWICE DAILY WITH A MEAL Patient taking differently: Take 24 mcg by mouth every morning.  02/22/19  Yes  Walden Field A, NP  amLODipine (NORVASC) 10 MG tablet Take 1 tablet (10 mg total) by mouth daily. Patient taking differently: Take 10 mg by mouth every morning.  03/18/19  Yes [place], [name], MD  Cholecalciferol (VITAMIN D3) 125 MCG (5000 UT) CAPS Take 1 capsule (5,000 Units total) by mouth daily. Patient taking differently: Take 5,000 Units by mouth every morning.  03/08/19  Yes [place], [name], MD  cloNIDine (CATAPRES) 0.2 MG tablet TAKE 1 TABLET BY MOUTH THREE TIMES DAILY Patient taking differently: Take 0.2 mg by mouth 3 (three) times daily.  05/22/19  Yes [place], [name], MD  furosemide (LASIX) 40 MG tablet TAKE 1 TABLET BY MOUTH EVERY DAY Patient taking differently: Take 40 mg by mouth every morning.  03/18/19  Yes [place], [name], MD  gabapentin (NEURONTIN) 300 MG capsule TAKE 2 CAPSULES BY MOUTH TWICE DAILY(MAY TAKE AN ADDITIONAL 2 CAPSULES MIDDAY AS NEEDED FOR PAIN) Patient taking differently: Take 600 mg by mouth 2 (two) times daily. TAKE 2 CAPSULES BY MOUTH TWICE DAILY(MAY TAKE AN ADDITIONAL 2 CAPSULES MIDDAY AS  NEEDED FOR PAIN) 04/22/19  Yes [place], [name], MD  Insulin Glargine (LANTUS SOLOSTAR) 100 UNIT/ML Solostar Pen Inject 38 Units into the skin at bedtime. 06/11/19  Yes [place], Marella Chimes, MD  lisinopril (ZESTRIL) 20 MG tablet TAKE 1 TABLET BY MOUTH DAILY Patient taking differently: Take 20 mg by mouth every morning.  06/24/19  Yes [place], [name], MD  metFORMIN (GLUCOPHAGE) 500 MG tablet TAKE 1 TABLET BY MOUTH TWICE DAILY Patient taking differently: Take 500 mg by mouth every morning.  04/22/19  Yes [place], Marella Chimes, MD  metoprolol succinate (TOPROL-XL) 50 MG 24 hr tablet TAKE 1 TABLET BY MOUTH EVERY DAY WITH OR IMMEDIATELY FOLLOWING A MEAL Patient taking differently: Take 50 mg by mouth every morning.  06/03/19  Yes [place], [name], MD  oxyCODONE-acetaminophen (PERCOCET) 7.5-325 MG tablet Take 1 tablet by mouth every 4 (four) hours as needed. Patient taking differently: Take 1 tablet by mouth 2 (two) times daily.  05/10/19  Yes [place], [name], MD  pantoprazole (PROTONIX) 40 MG tablet TAKE 1 TABLET BY MOUTH DAILY Patient taking differently: Take 40 mg by mouth every morning.  04/17/19  Yes [place], [name], MD  potassium chloride SA (K-DUR) 20 MEQ tablet TAKE 1 TABLET(20 MEQ) BY MOUTH DAILY Patient taking differently: Take 20 mEq by mouth daily.  06/24/19  Yes [place], [name], MD  simvastatin (ZOCOR) 20 MG tablet TAKE 1 TABLET BY MOUTH EVERY NIGHT AT BEDTIME Patient taking differently: Take 20 mg by mouth at bedtime.  05/22/19  Yes [place], [name], MD  tamsulosin (FLOMAX) 0.4 MG CAPS capsule TAKE 1 CAPSULE BY MOUTH DAILY Patient taking differently: Take 0.4 mg by mouth every morning.  07/09/19  Yes [place], [name], MD  tiZANidine (ZANAFLEX) 2 MG tablet TAKE 1 TABLET(2 MG) BY MOUTH TWICE DAILY AS NEEDED Patient taking differently: Take 2 mg by mouth 2 (two) times daily.  11/26/18  Yes [place], [name], MD  Triamcinolone Acetonide (TRIAMCINOLONE 0.1 % CREAM : EUCERIN) CREA Apply 1  application topically 2 (two) times daily as needed. Patient taking differently: Apply 1 application topically 2 (two) times daily as needed for rash.  02/06/18  Yes , [name], MD  amoxicillin-clavulanate (AUGMENTIN) 875-125 MG tablet Take 1 tablet by mouth every 12 (twelve) hours. 07/15/19   Frederica Kuster, PA-C    Family History Family History  Problem Relation Age of  Onset   Heart failure Mother    Heart failure Father    Heart failure Sister    Heart failure Son     Social History Social History   Tobacco Use   Smoking status: Former Smoker    Packs/day: 3.00    Years: 25.00    Pack years: 75.00    Types: Cigarettes    Quit date: 03/01/1997    Years since quitting: 22.3   Smokeless tobacco: Never Used  Substance Use Topics   Alcohol use: No   Drug use: No     Allergies   Patient has no known allergies.   Review of Systems Review of Systems  Constitutional: Positive for chills and fever.  HENT: Negative for facial swelling and sore throat.   Respiratory: Positive for cough. Negative for shortness of breath.   Cardiovascular: Negative for chest pain.  Gastrointestinal: Positive for abdominal pain. Negative for diarrhea, nausea and vomiting.  Genitourinary: Positive for dysuria.  Musculoskeletal: Negative for back pain.  Skin: Negative for rash and wound.  Neurological: Negative for headaches.  Psychiatric/Behavioral: The patient is not nervous/anxious.      Physical Exam Updated Vital Signs BP (!) 147/95    Pulse 98    Temp 99.1 F (37.3 C) (Oral)    Resp 20    Ht 6' 2"  (1.88 m)    Wt 90.7 kg    SpO2 98%    BMI 25.68 kg/m   Physical Exam Vitals signs and nursing note reviewed.  Constitutional:      General: He is not in acute distress.    Appearance: He is well-developed. He is not diaphoretic.  HENT:     Head: Normocephalic and atraumatic.     Mouth/Throat:     Pharynx: No oropharyngeal exudate.      Comments: No trismus, no  submandibular tenderness Eyes:     General: No scleral icterus.       Right eye: No discharge.        Left eye: No discharge.     Conjunctiva/sclera: Conjunctivae normal.     Pupils: Pupils are equal, round, and reactive to light.  Neck:     Musculoskeletal: Normal range of motion and neck supple.     Thyroid: No thyromegaly.  Cardiovascular:     Rate and Rhythm: Normal rate and regular rhythm.     Heart sounds: Normal heart sounds. No murmur. No friction rub. No gallop.   Pulmonary:     Effort: Pulmonary effort is normal. No respiratory distress.     Breath sounds: No stridor. Rhonchi present. No wheezing or rales.  Abdominal:     General: Bowel sounds are normal. There is no distension.     Palpations: Abdomen is soft.     Tenderness: There is abdominal tenderness in the suprapubic area. There is no guarding or rebound.  Musculoskeletal:     Right lower leg: No edema.     Left lower leg: No edema.  Lymphadenopathy:     Cervical: No cervical adenopathy.  Skin:    General: Skin is warm and dry.     Coloration: Skin is not pale.     Findings: No rash.  Neurological:     Mental Status: He is alert.     Coordination: Coordination normal.      ED Treatments / Results  Labs (all labs ordered are listed, but only abnormal results are displayed) Labs Reviewed  COMPREHENSIVE METABOLIC PANEL - Abnormal; Notable for the following  components:      Result Value   Glucose, Bld 111 (*)    BUN 24 (*)    Creatinine, Ser 1.74 (*)    Calcium 10.8 (*)    Total Protein 8.3 (*)    GFR calc non Af Amer 38 (*)    GFR calc Af Amer 44 (*)    All other components within normal limits  LACTIC ACID, PLASMA - Abnormal; Notable for the following components:   Lactic Acid, Venous 2.4 (*)    All other components within normal limits  CBC WITH DIFFERENTIAL/PLATELET - Abnormal; Notable for the following components:   Hemoglobin 10.4 (*)    HCT 34.2 (*)    MCV 76.5 (*)    MCH 23.3 (*)    RDW  17.4 (*)    Neutro Abs 8.1 (*)    Lymphs Abs 0.6 (*)    All other components within normal limits  URINALYSIS, ROUTINE W REFLEX MICROSCOPIC - Abnormal; Notable for the following components:   Color, Urine STRAW (*)    Protein, ur 100 (*)    All other components within normal limits  CULTURE, BLOOD (ROUTINE X 2)  SARS CORONAVIRUS 2 (HOSPITAL ORDER, [place] [facility])  URINE CULTURE  LACTIC ACID, PLASMA  APTT  PROTIME-INR    EKG None  Radiology Ct Abdomen Pelvis Wo Contrast  Result Date: 07/15/2019 CLINICAL DATA:  Abdominal pain with fever EXAM: CT ABDOMEN AND PELVIS WITHOUT CONTRAST TECHNIQUE: Multidetector CT imaging of the abdomen and pelvis was performed following the standard protocol without IV contrast. COMPARISON:  CT 06/27/2019, 05/23/2016, 12/01/2018 FINDINGS: Lower chest: Lung bases demonstrate no acute consolidation or effusion. Mild bronchiectasis in the right lower lobe. Heart size within normal limits. Trace pericardial effusion. Hepatobiliary: Calcified granuloma. Status post cholecystectomy. No biliary dilatation Pancreas: Unremarkable. No pancreatic ductal dilatation or surrounding inflammatory changes. Spleen: Normal in size without focal abnormality. Adrenals/Urinary Tract: Adrenal glands are stable. Mild nodularity and thickening of left adrenal gland as before. No hydronephrosis. Multiple cysts within the bilateral kidneys. The bladder is unremarkable Stomach/Bowel: The stomach is nonenlarged. No dilated small bowel. Diffuse diverticular disease of the colon without acute inflammatory change. Negative appendix. Vascular/Lymphatic: Nonaneurysmal aorta. Moderate aortic atherosclerosis. No significantly enlarged lymph nodes. Reproductive: Enlarged prostate with mild mass effect on the bladder Other: Negative for free air or free fluid. Small fat containing periumbilical hernia Musculoskeletal: Treated compression deformity L1. Diffuse lytic changes L2, no  change. IMPRESSION: 1. No CT evidence for acute intra-abdominal or pelvic abnormality. Negative for acute appendicitis. 2. Bilateral renal cysts 3. Colon diverticular disease without acute inflammatory changes. Electronically Signed   By: Donavan Foil M.D.   On: 07/15/2019 21:40   Dg Chest Portable 1 View  Result Date: 07/15/2019 CLINICAL DATA:  Shivering.  History of treated lung cancer EXAM: PORTABLE CHEST 1 VIEW COMPARISON:  CT chest 05/09/2019 FINDINGS: Left upper lobe density compatible with known treated carcinoma. No change from the recent CT No acute pneumonia or effusion. Negative for heart failure. Aortic ectasia IMPRESSION: Treated cancer left upper lobe as noted on recent CT Negative for pneumonia Electronically Signed   By: Franchot Gallo M.D.   On: 07/15/2019 14:01    Procedures Procedures (including critical care time)  Medications Ordered in ED Medications  cefTRIAXone (ROCEPHIN) 1 g in sodium chloride 0.9 % 100 mL IVPB (0 g Intravenous Stopped 07/15/19 1944)  0.9 %  sodium chloride infusion (0 mLs Intravenous Stopped 07/15/19 2211)  morphine  4 MG/ML injection 4 mg (0 mg Intravenous Hold 07/15/19 2129)  acetaminophen (TYLENOL) tablet 1,000 mg (1,000 mg Oral Given 07/15/19 1332)  sodium chloride 0.9 % bolus 1,000 mL (0 mLs Intravenous Stopped 07/15/19 1944)     Initial Impression / Assessment and Plan / ED Course  I have reviewed the triage vital signs and the nursing notes.  Pertinent labs & imaging results that were available during my care of the patient were reviewed by me and considered in my medical decision making (see chart for details).  Clinical Course as of Jul 14 2221  Mon Jul 15, 2019  2020 We were waiting on UA to return for admission and there is no sign of infection on UA.  This was initial thought as cause for patient's sepsis.  Patient is remaining stable with stable vitals and lactic is cleared after fluids and IV Rocephin.  Patient's abdominal pain has now  localized to the right lower quadrant as it was mostly suprapubic before.  Will order CT abdomen pelvis.   [AL]    Clinical Course User Index [AL] Frederica Kuster, PA-C       Patient presenting with a 1 day history of fever.  He has had associated dysuria and dark urine and some suprapubic pain.  Patient's CBC is stable from the past.  CMP shows BUN 24, creatinine 1.74, which is elevated a little bit from the past.  Patient given IV fluids.  It took a very long time to get urine, which I suspected from the start was the cause of patient's fever and elevated lactic acid at 2.4.  UA does not show UTI.  Urine culture sent.  Lactic acid was cleared with 1 L of fluids.  COVID-19 negative.  CT abdomen pelvis is negative for appendicitis or other acute findings.  I found out long time into the patient's visit that he has been having some dental pain, which his wife told the nurse on the phone.  He does have tenderness and this could be a source of infection.  Vitals are stable and patient not appearing septic at this time, although patient was initially worked up and septic protocol given Rocephin.  Blood culture sent and pending.  At this time, we are not finding indication to admit considering vitals are stable and no source is found, other than dental pain.  Will treat with Augmentin which should also cover any diverticular finding not seen on the Noncontrast CT.  Patient advised close follow-up with PCP and given strict return precautions.  Patient has a dental appointment next week.  I called patient's wife and updated her and she is agreeable to plan, as is the patient.  Continue Tylenol for fever.  Increase hydration, as patient's urine became much clearer after IV fluids.  Patient vitals stable and discharged in satisfactory condition.  Patient also evaluated by my attending, Dr. Roderic Palau, who guided the patient's management and agrees with plan.  Final Clinical Impressions(s) / ED Diagnoses   Final  diagnoses:  Fever, unspecified fever cause  Pain, dental    ED Discharge Orders         Ordered    amoxicillin-clavulanate (AUGMENTIN) 875-125 MG tablet  Every 12 hours     07/15/19 2206           Frederica Kuster, PA-C 07/15/19 2223    Milton Ferguson, MD 07/19/19 [id number]

## 2019-07-15 NOTE — ED Triage Notes (Signed)
Pt arrived via EMS c/o 101.1 fever at home. Pt states his wife came home from church and found him in the bed shivering. Reports having dark color urine, burning sensation and abdominal pain.

## 2019-07-15 NOTE — ED Notes (Signed)
Patient attempted to give a urine sample at this time with no success.

## 2019-07-17 ENCOUNTER — Other Ambulatory Visit: Payer: Self-pay | Admitting: Family Medicine

## 2019-07-17 LAB — URINE CULTURE: Culture: NO GROWTH

## 2019-07-18 IMAGING — CT CT ABD-PELV W/ CM
3 of 9 series · 12 of 46 positions shown, 17 images · IV contrast (Isovue)
Comparison: Noncontrast CT on 07/27/2018

CLINICAL DATA: Fall this morning. Blunt abdominal trauma. Abdominal
pain. Initial encounter.

EXAM:
CT ABDOMEN AND PELVIS WITH CONTRAST
TECHNIQUE: Multidetector CT imaging of the abdomen and pelvis was performed
using the standard protocol following bolus administration of
intravenous contrast.
CONTRAST:  100mL [id number] IOPAMIDOL ([id number]) INJECTION 61%

[Series 8: coronal st · coronal · 0.97mm/px · 3 of 123 slices shown]
[im 31/123  soft-tissue]
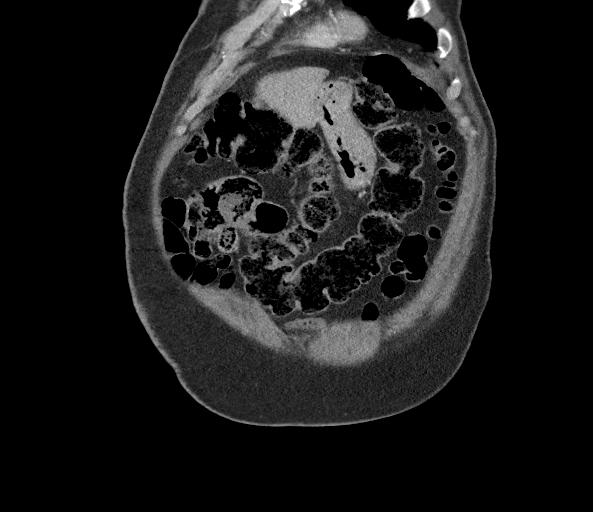
[im 62/123  soft-tissue]
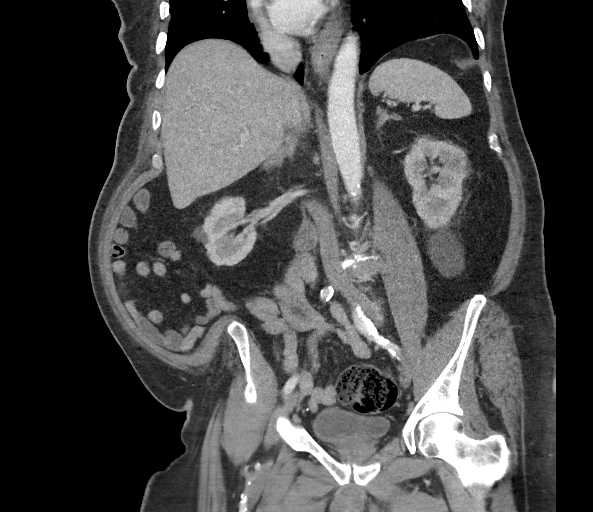
[im 92/123  soft-tissue]
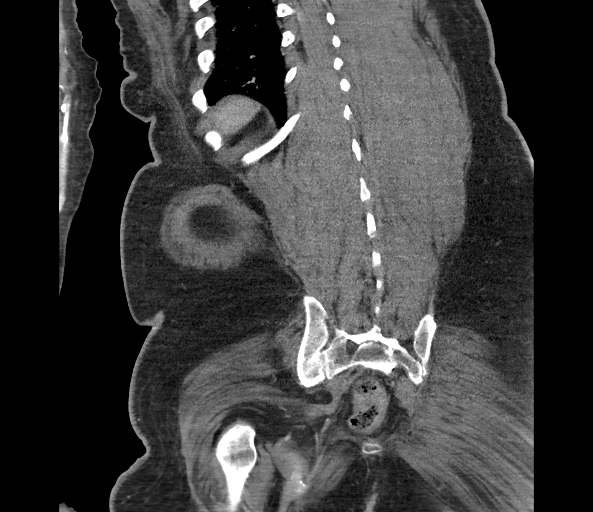

[Series 9: sagittal st · sagittal · 0.78mm/px · 1 of 163 slices shown]
[im 82/163  soft-tissue]
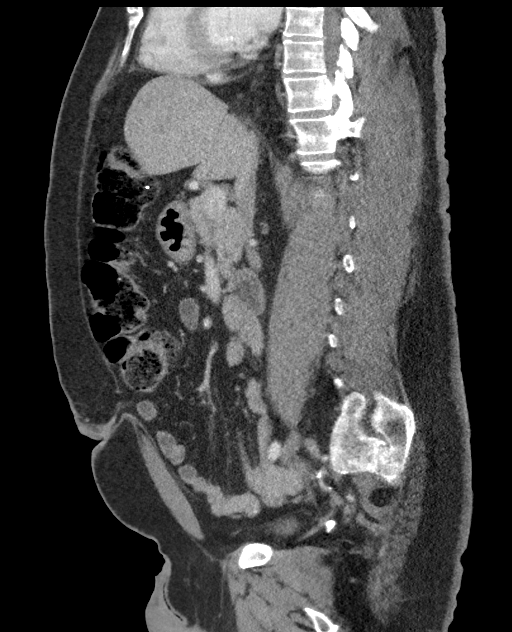

[Series 10: axial st · axial · 0.95mm/px · z∈[-775,-390]mm · 8 of 99 slices shown, 13 images]
[im 11/99  soft-tissue]
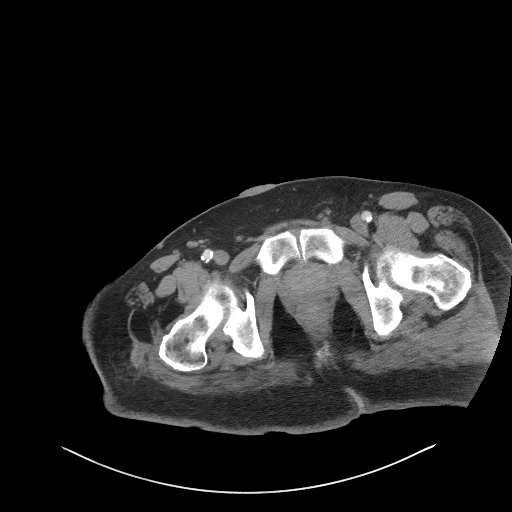
[im 11/99  bone]
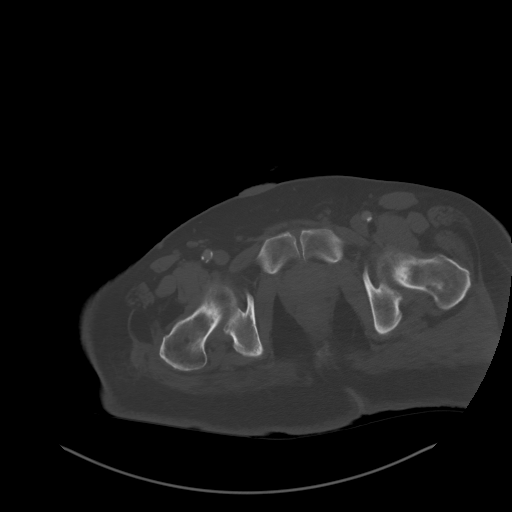
[im 22/99  soft-tissue]
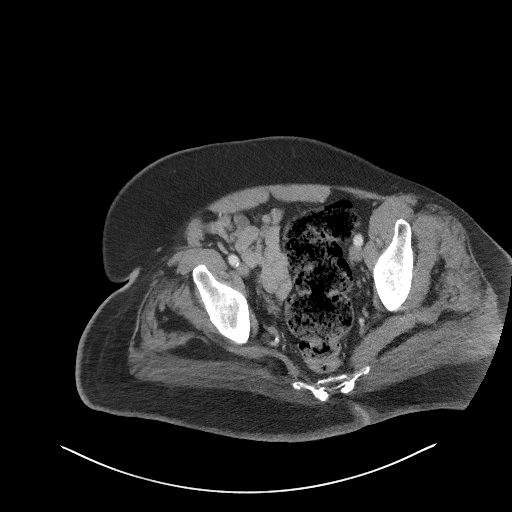
[im 33/99  soft-tissue]
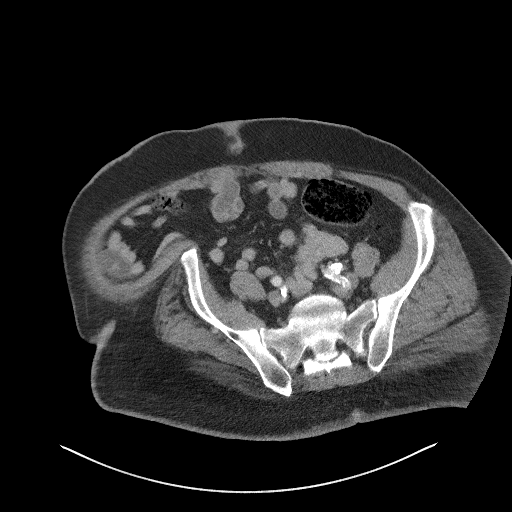
[im 44/99  soft-tissue]
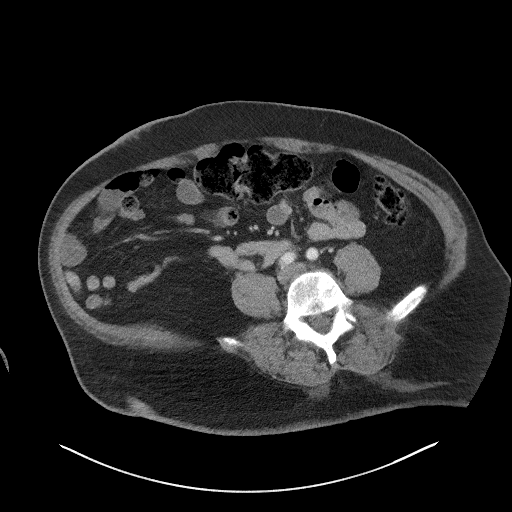
[im 55/99  soft-tissue]
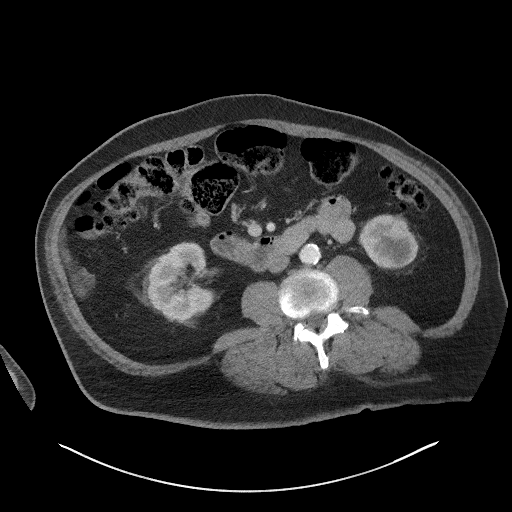
[im 55/99  lung]
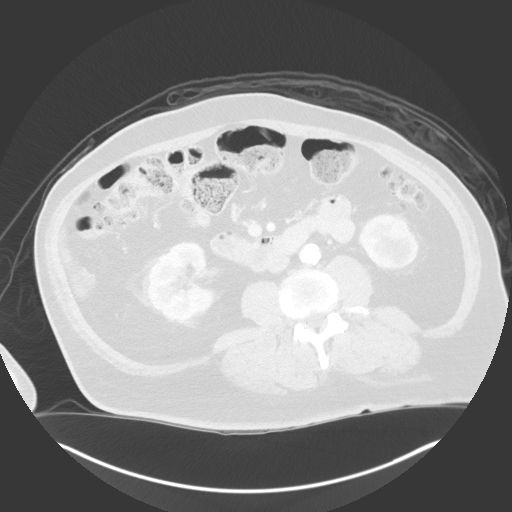
[im 66/99  soft-tissue]
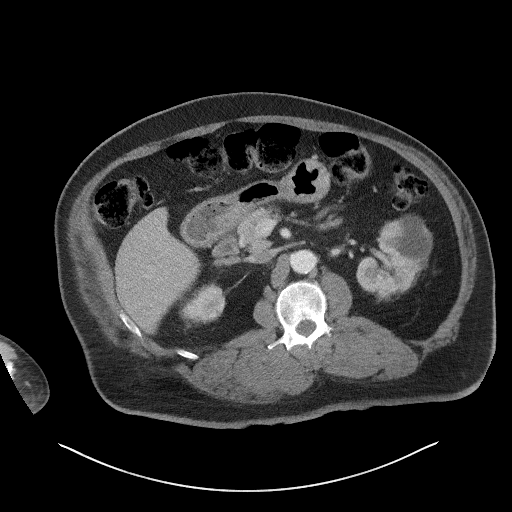
[im 66/99  lung]
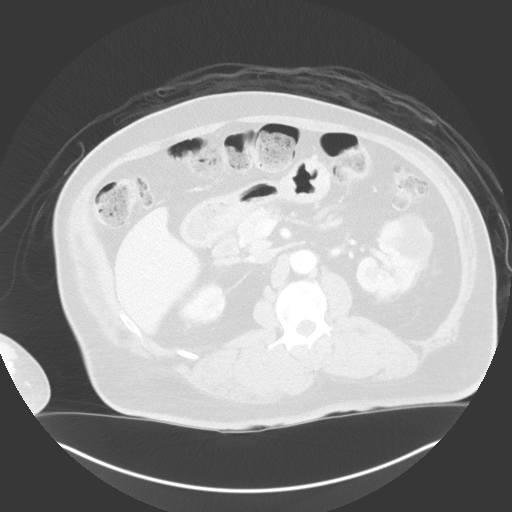
[im 77/99  soft-tissue]
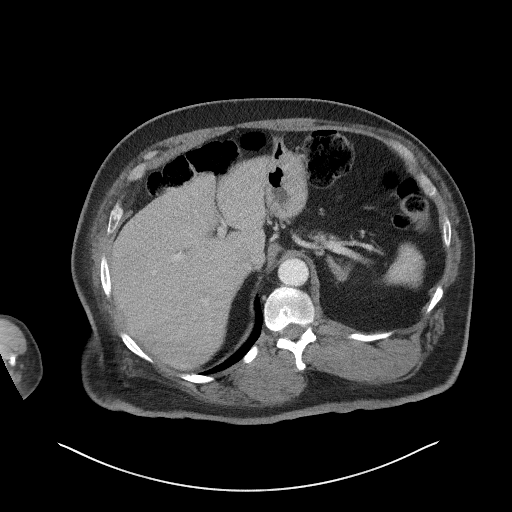
[im 77/99  lung]
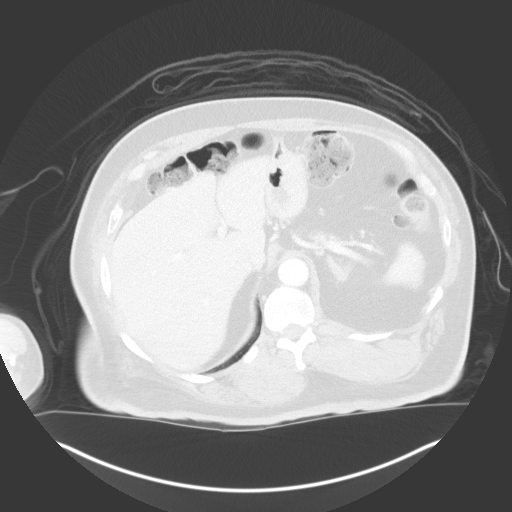
[im 88/99  soft-tissue]
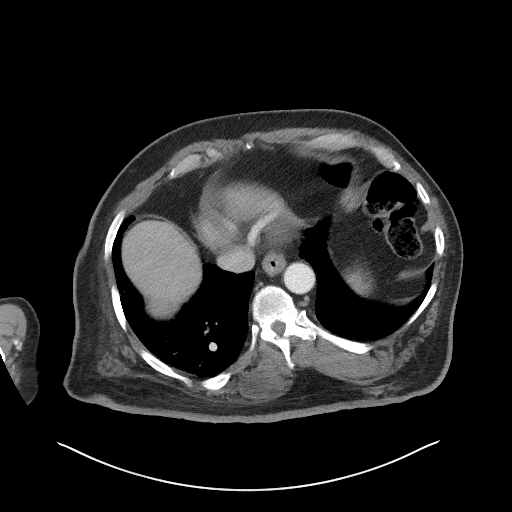
[im 88/99  lung]
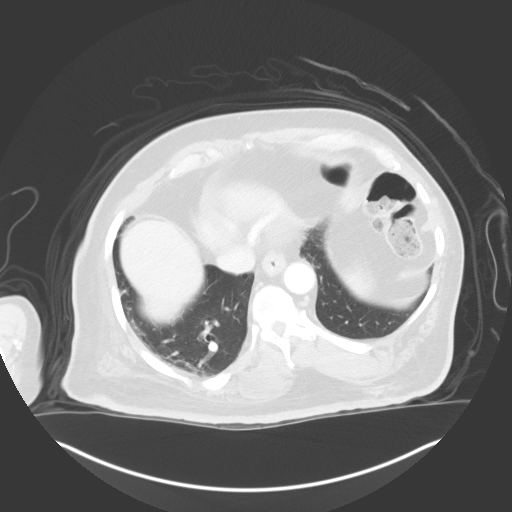

[12 of 46 positions shown; findings below may reference images not displayed]

FINDINGS: Lower chest: No acute findings. Bilateral lower lobe bronchiectasis
again noted.

Hepatobiliary: No hepatic laceration or mass identified. Tiny sub-cm
cysts remain stable. Prior cholecystectomy. No evidence of biliary
obstruction.

Pancreas: No parenchymal laceration, mass, or inflammatory changes
identified.

Spleen: No evidence of splenic laceration.

Adrenal/Urinary Tract: No hemorrhage or parenchymal lacerations
identified. Multiple small renal cysts are again seen bilaterally.
No evidence of mass or hydronephrosis. Unremarkable unopacified
urinary bladder.

Stomach/Bowel: No evidence of hemoperitoneum. Colonic diverticulosis
again seen, without evidence of diverticulitis. Normal appendix
visualized.

Vascular/Lymphatic: No evidence of abdominal aortic injury. Aortic
atherosclerosis. No pathologically enlarged lymph nodes identified.

Reproductive:  No mass or other significant abnormality identified.

Other:  None.

Musculoskeletal: No acute fractures or suspicious bone lesions
identified.
IMPRESSION: No evidence of visceral injury or other acute findings.

Colonic diverticulosis, without radiographic evidence of
diverticulitis.

## 2019-07-20 LAB — CULTURE, BLOOD (ROUTINE X 2)
Culture: NO GROWTH
Special Requests: ADEQUATE

## 2019-07-21 IMAGING — MR MR LUMBAR SPINE W/O CM
4 of 5 series · 18 of 48 positions shown · non-contrast
Comparison: CT abdomen 06/20/2014, PET-CT 08/30/2018, CT abdomen
05/23/2016

CLINICAL DATA: Status post fall, severe back pain

EXAM:
MRI LUMBAR SPINE WITHOUT CONTRAST
TECHNIQUE: Multiplanar, multisequence MR imaging of the lumbar spine was
performed. No intravenous contrast was administered.

[Series 2: T2 · sagittal · 4.0mm · 0.66mm/px · 6 of 14 slices shown (1 of 2)]
[im 1/14]
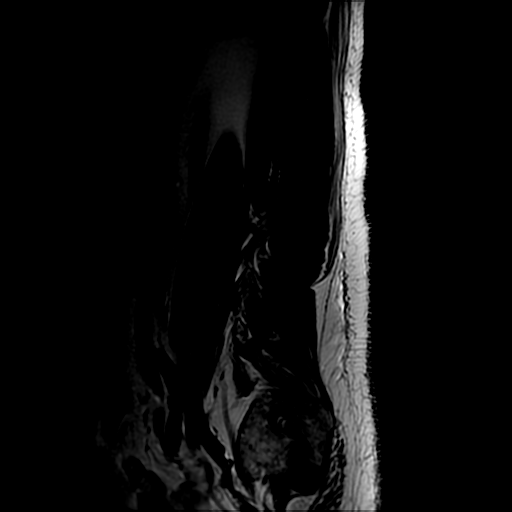
[im 3/14]
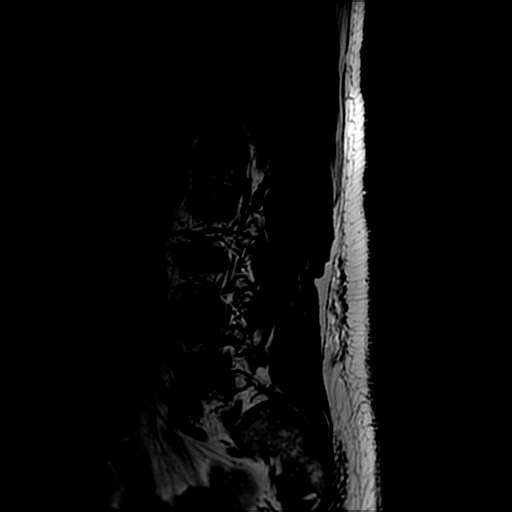
[im 6/14]
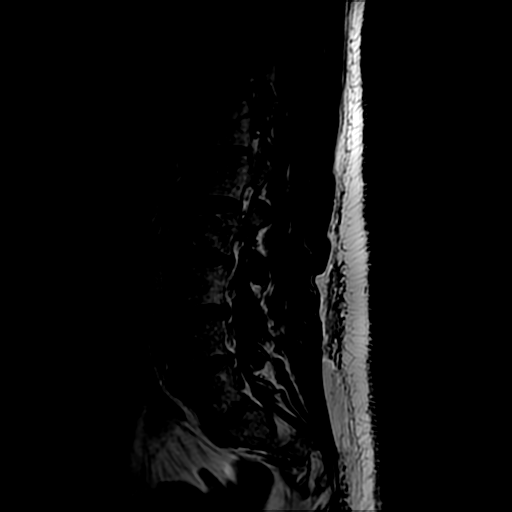
[im 8/14]
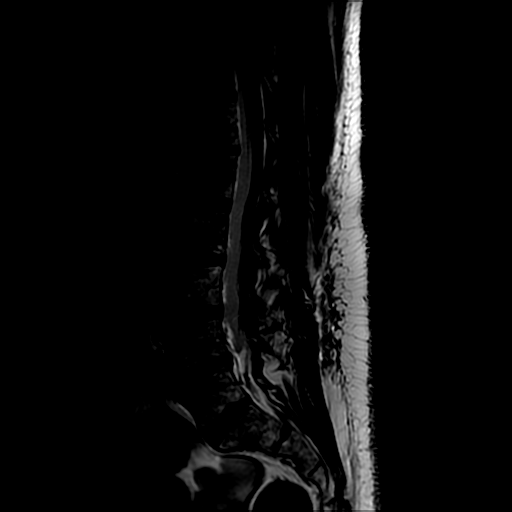
[im 11/14]
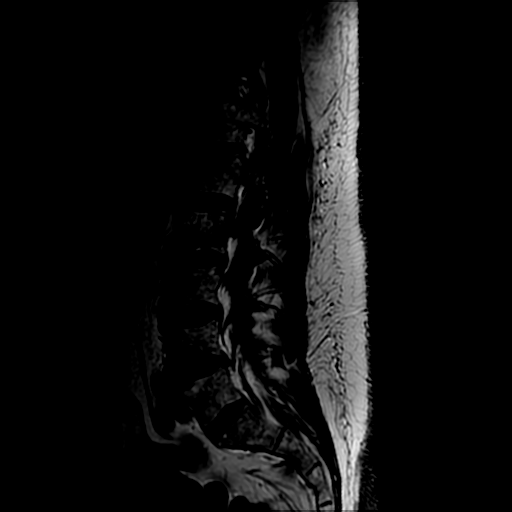
[im 14/14]
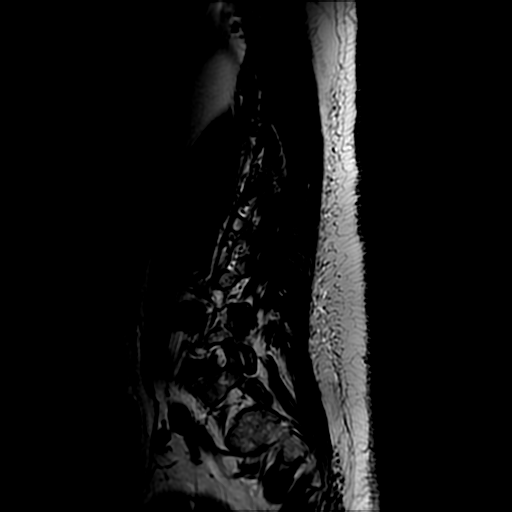

[Series 4: T1 · sagittal · 4.0mm · 0.66mm/px · 3 of 14 slices shown (1 of 2)]
[im 1/14]
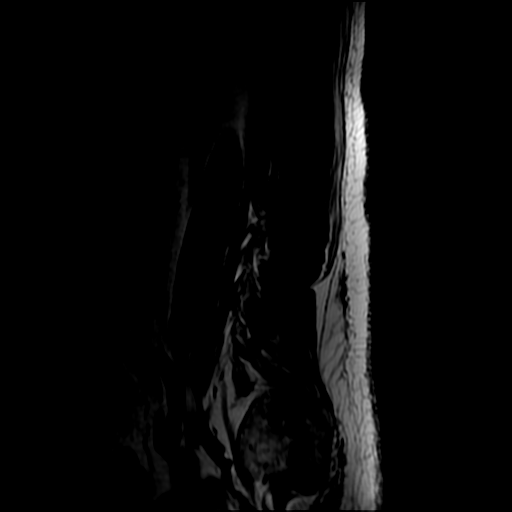
[im 7/14]
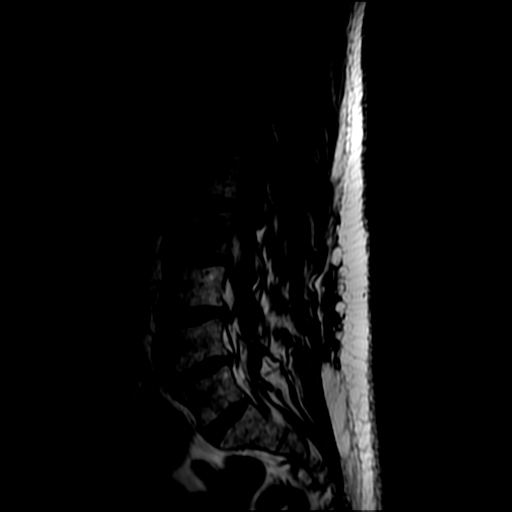
[im 14/14]
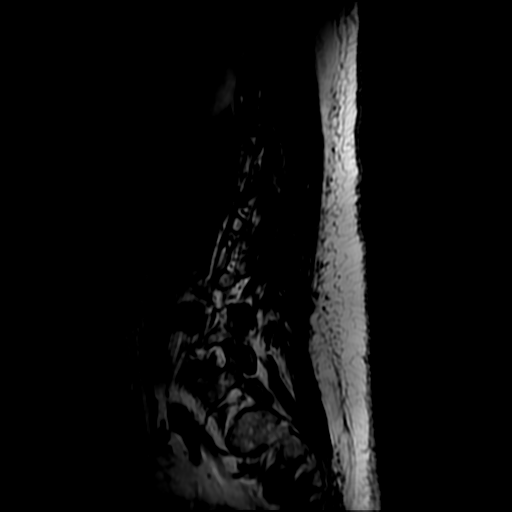

[Series 5: T2 · axial · 4.0mm · 0.39mm/px · z∈[-17,+143]mm · 6 of 41 slices shown (2 of 2)]
[im 3/41]
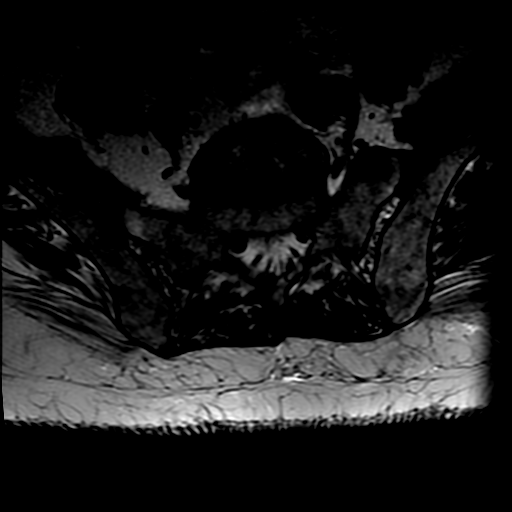
[im 6/41]
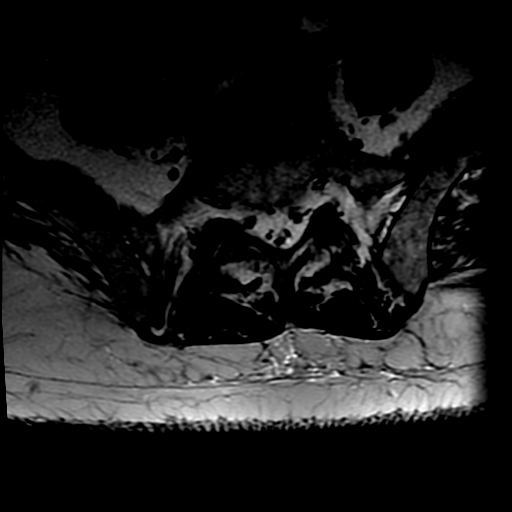
[im 9/41]
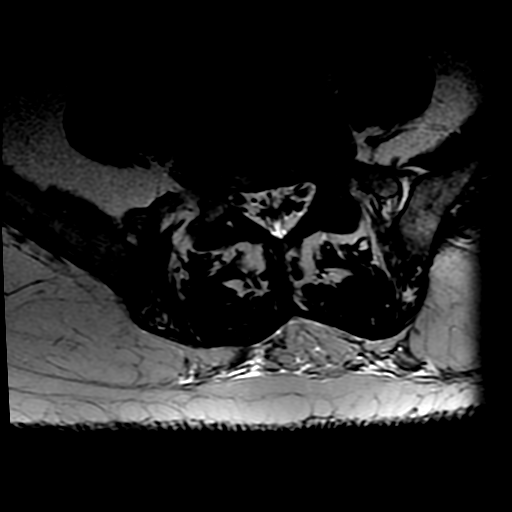
[im 14/41]
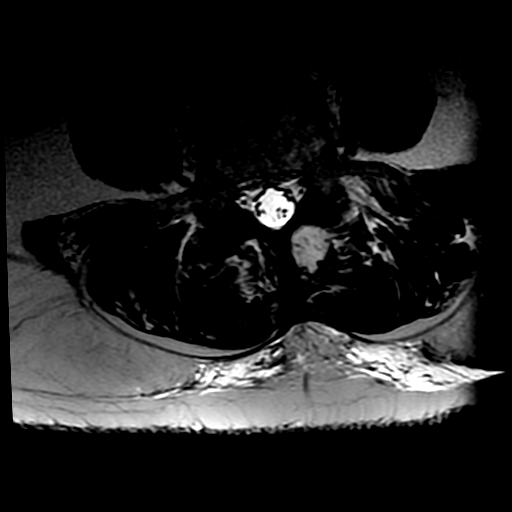
[im 22/41]
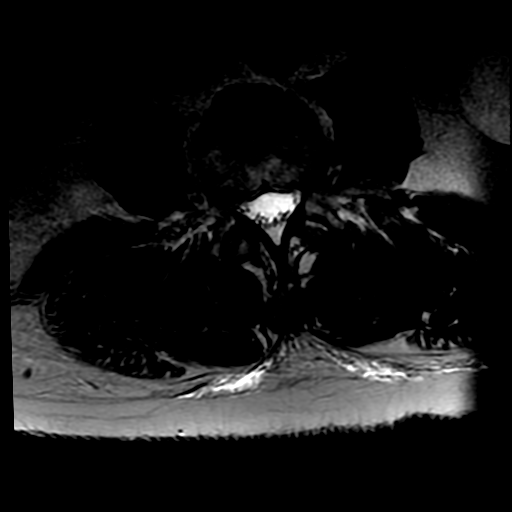
[im 35/41]
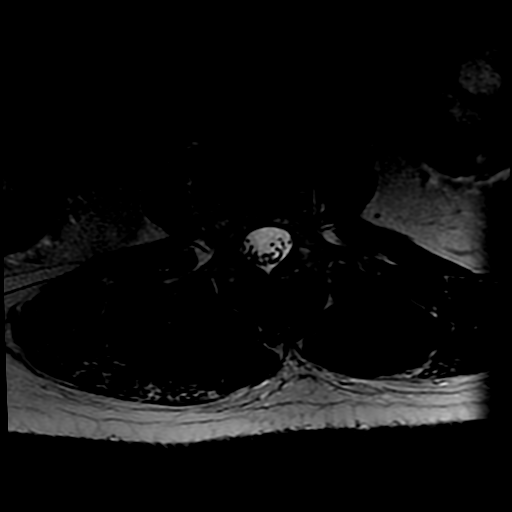

[Series 6: T1 · axial · 4.0mm · 0.39mm/px · z∈[-2,+143]mm · 3 of 41 slices shown (2 of 2)]
[im 6/41]
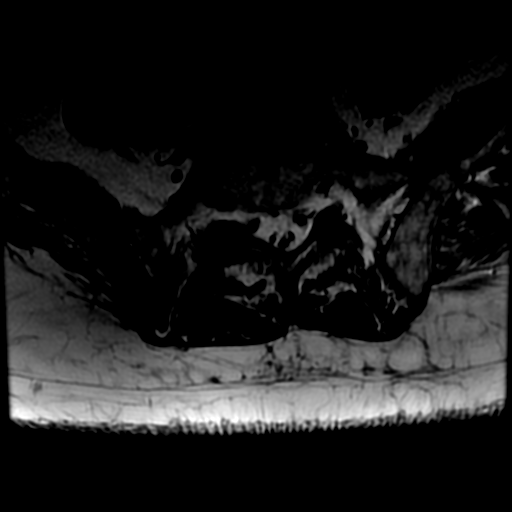
[im 22/41]
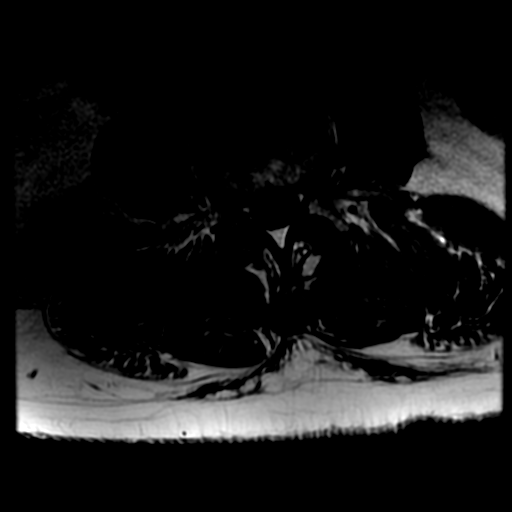
[im 35/41]
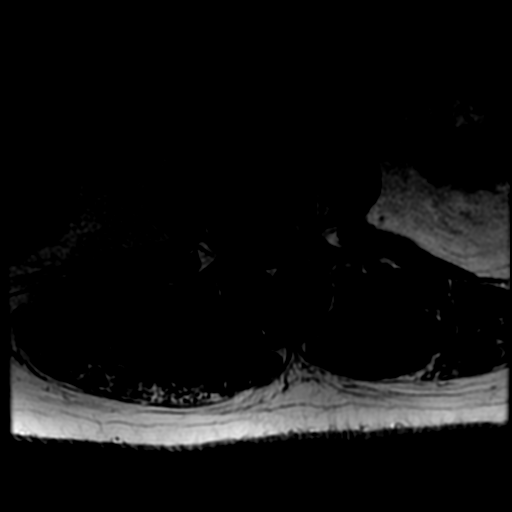

[18 of 48 positions shown; findings below may reference images not displayed]

FINDINGS: Segmentation:  Standard.

Alignment:  Physiologic.

Vertebrae: No discitis or osteomyelitis. L1 vertebral body
compression fracture with approximately 20% anterior height loss and
marrow edema along the superior endplate most concerning for an
acute versus subacute compression fracture. Abnormal heterogeneous
T1 and T2 marrow signal throughout L2 vertebral body with
approximately 20% anterior height loss most concerning for a
pathologic compression fracture. Marrow heterogeneity is present and
although this can be caused by marrow infiltrative processes, the
most common causes include anemia, smoking, obesity, or advancing
age.

Conus medullaris and cauda equina: Conus extends to the T12 level.
Conus and cauda equina appear normal.

Paraspinal and other soft tissues: No acute paraspinal abnormality.

Disc levels:

Disc spaces: Degenerative disc disease with disc height loss at
L4-5.

T12-L1: No significant disc bulge. No evidence of neural foraminal
stenosis. No central canal stenosis.

L1-L2: No significant disc bulge. No evidence of neural foraminal
stenosis. No central canal stenosis.

L2-L3: No significant disc bulge. No evidence of neural foraminal
stenosis. No central canal stenosis.

L3-L4: No significant disc bulge with a shallow right foraminal disc
protrusion. Moderate bilateral facet arthropathy with bilateral
facet effusions. No evidence of neural foraminal stenosis. No
central canal stenosis.

L4-L5: Broad-based disc bulge. Mild bilateral facet arthropathy. No
evidence of neural foraminal stenosis. No central canal stenosis.

L5-S1: No significant disc bulge. No evidence of neural foraminal
stenosis. No central canal stenosis.
IMPRESSION: 1. Acute-subacute L1 vertebral body compression fracture.
2. Chronic pathologic compression fracture of the L2 vertebral body
unchanged compared with prior CT abdomen dated 06/20/2014.
[DATE]. Lumbar spine spondylosis as described above.

## 2019-07-24 ENCOUNTER — Other Ambulatory Visit: Payer: Self-pay

## 2019-07-24 ENCOUNTER — Ambulatory Visit (INDEPENDENT_AMBULATORY_CARE_PROVIDER_SITE_OTHER): Payer: Medicare Other | Admitting: Family Medicine

## 2019-07-24 ENCOUNTER — Encounter: Payer: Self-pay | Admitting: Family Medicine

## 2019-07-24 VITALS — BP 128/64 | HR 74 | Temp 98.8°F | Resp 14

## 2019-07-24 DIAGNOSIS — B372 Candidiasis of skin and nail: Secondary | ICD-10-CM | POA: Diagnosis not present

## 2019-07-24 DIAGNOSIS — L89151 Pressure ulcer of sacral region, stage 1: Secondary | ICD-10-CM | POA: Diagnosis not present

## 2019-07-24 DIAGNOSIS — N179 Acute kidney failure, unspecified: Secondary | ICD-10-CM | POA: Diagnosis not present

## 2019-07-24 MED ORDER — NYSTATIN 100000 UNIT/GM EX POWD: Freq: Three times a day (TID) | CUTANEOUS | 2 refills | Status: DC

## 2019-07-24 NOTE — Patient Instructions (Signed)
F/U as previous Kidney labs will be checked by nurse

## 2019-07-24 NOTE — Progress Notes (Signed)
   Subjective:    Patient ID: [name], male    DOB: 1945-12-20, 73 y.o.   MRN: [id number]  Patient presents for Rectal Pain (states that he has pain in his rectum that he thinks is a boil- pt wife states that she can't see any changes)  Patient here with his wife.  He has been complaining of pain in his bottom near his rectum for the past few weeks.  He states he feels like there is something sore there may be even a lump.  Wife states that she is look she has not noted anything on the buttocks but it is difficult to maneuver him setting of his hemiplegia on the right side from his stroke.  He denies any blood in the stool bowels are normal.  He is urinating normally.  Of note he was also seen in the ER secondary to low-grade fever abdominal pain.  Work-up was fairly benign except for he appeared dehydrated creatinine was up to 1.7 his typical is about 1.31 he did have a low-grade temperature but no source of fever could be found.  His fever resolved after he was given 1 dose of Tylenol in the emergency room.   Review Of Systems:  GEN- denies fatigue, fever, weight loss,weakness, recent illness HEENT- denies eye drainage, change in vision, nasal discharge, CVS- denies chest pain, palpitations RESP- denies SOB, cough, wheeze ABD- denies N/V, change in stools, abd pain GU- denies dysuria, hematuria, dribbling, incontinence MSK- denies joint pain, muscle aches, injury Neuro- denies headache, dizziness, syncope, seizure activity       Objective:    BP 128/64   Pulse 74   Temp 98.8 F (37.1 C) (Oral)   Resp 14   SpO2 96%  GEN- NAD, alert and oriented x3, sitting in wheelchair HEENT- PERRL, EOMI, non injected sclera, pink conjunctiva, MMM, oropharynx clear Neck- Supple, no thyromegaly, no LAD CVS- RRR, no murmur RESP-CTAB ABD-NABS,soft,chronic TTP RUQ  no rebound, no guarding Skin- erythema with moisture maceration gluteal cleft, TTP over sacrum, small cracks in skin  erythema  with moisture beneath pannus right side  Neuro right hemiplegia  EXT- chronic non pitting  edema Pulses- Radial 2+       Assessment & Plan:      Problem List Items Addressed This Visit    None    Visit Diagnoses    Decubitus ulcer of sacral region, stage 1    -  Primary   pressure sore, chair bound, already getting some skin irritation/breakdown also has yeasty smell in gluteal cleft and beneath pannus, nystatin powder, HH Nurse for wound treatment.   Relevant Orders   Ambulatory referral to [place] candidiasis       Relevant Medications   nystatin (MYCOSTATIN/NYSTOP) powder   Other Relevant Orders   Ambulatory referral to [place]   Acute renal failure, unspecified acute renal failure type ([place])       Difficult stick for blood, needs to hydrate more, will see if [facility] can get BMET when they go out      Note: This dictation was prepared with Dragon dictation along with smaller phrase technology. Any transcriptional errors that result from this process are unintentional.

## 2019-07-29 DIAGNOSIS — C3412 Malignant neoplasm of upper lobe, left bronchus or lung: Secondary | ICD-10-CM | POA: Diagnosis not present

## 2019-07-29 DIAGNOSIS — L89321 Pressure ulcer of left buttock, stage 1: Secondary | ICD-10-CM | POA: Diagnosis not present

## 2019-07-29 DIAGNOSIS — Z993 Dependence on wheelchair: Secondary | ICD-10-CM | POA: Diagnosis not present

## 2019-07-29 DIAGNOSIS — E039 Hypothyroidism, unspecified: Secondary | ICD-10-CM | POA: Diagnosis not present

## 2019-07-29 DIAGNOSIS — M25512 Pain in left shoulder: Secondary | ICD-10-CM | POA: Diagnosis not present

## 2019-07-29 DIAGNOSIS — Z794 Long term (current) use of insulin: Secondary | ICD-10-CM | POA: Diagnosis not present

## 2019-07-29 DIAGNOSIS — B372 Candidiasis of skin and nail: Secondary | ICD-10-CM | POA: Diagnosis not present

## 2019-07-29 DIAGNOSIS — I69351 Hemiplegia and hemiparesis following cerebral infarction affecting right dominant side: Secondary | ICD-10-CM | POA: Diagnosis not present

## 2019-07-29 DIAGNOSIS — E46 Unspecified protein-calorie malnutrition: Secondary | ICD-10-CM | POA: Diagnosis not present

## 2019-07-29 DIAGNOSIS — N179 Acute kidney failure, unspecified: Secondary | ICD-10-CM | POA: Diagnosis not present

## 2019-07-29 DIAGNOSIS — E1122 Type 2 diabetes mellitus with diabetic chronic kidney disease: Secondary | ICD-10-CM | POA: Diagnosis not present

## 2019-07-29 DIAGNOSIS — E1140 Type 2 diabetes mellitus with diabetic neuropathy, unspecified: Secondary | ICD-10-CM | POA: Diagnosis not present

## 2019-07-29 DIAGNOSIS — N183 Chronic kidney disease, stage 3 (moderate): Secondary | ICD-10-CM | POA: Diagnosis not present

## 2019-07-29 DIAGNOSIS — L89311 Pressure ulcer of right buttock, stage 1: Secondary | ICD-10-CM | POA: Diagnosis not present

## 2019-07-29 DIAGNOSIS — G8929 Other chronic pain: Secondary | ICD-10-CM | POA: Diagnosis not present

## 2019-07-29 DIAGNOSIS — E782 Mixed hyperlipidemia: Secondary | ICD-10-CM | POA: Diagnosis not present

## 2019-07-29 DIAGNOSIS — I129 Hypertensive chronic kidney disease with stage 1 through stage 4 chronic kidney disease, or unspecified chronic kidney disease: Secondary | ICD-10-CM | POA: Diagnosis not present

## 2019-07-29 DIAGNOSIS — H409 Unspecified glaucoma: Secondary | ICD-10-CM | POA: Diagnosis not present

## 2019-07-30 IMAGING — US IR US GUIDE VASC ACCESS LEFT
1 series · 1 of 1 positions shown · non-contrast
Comparison: none

INDICATION: 72-year-old male presents for treatment of symptomatic compression
fracture

[Series 1: ir bone tumor rf ablation · 1 of 1 slices shown]
[im 1/1]
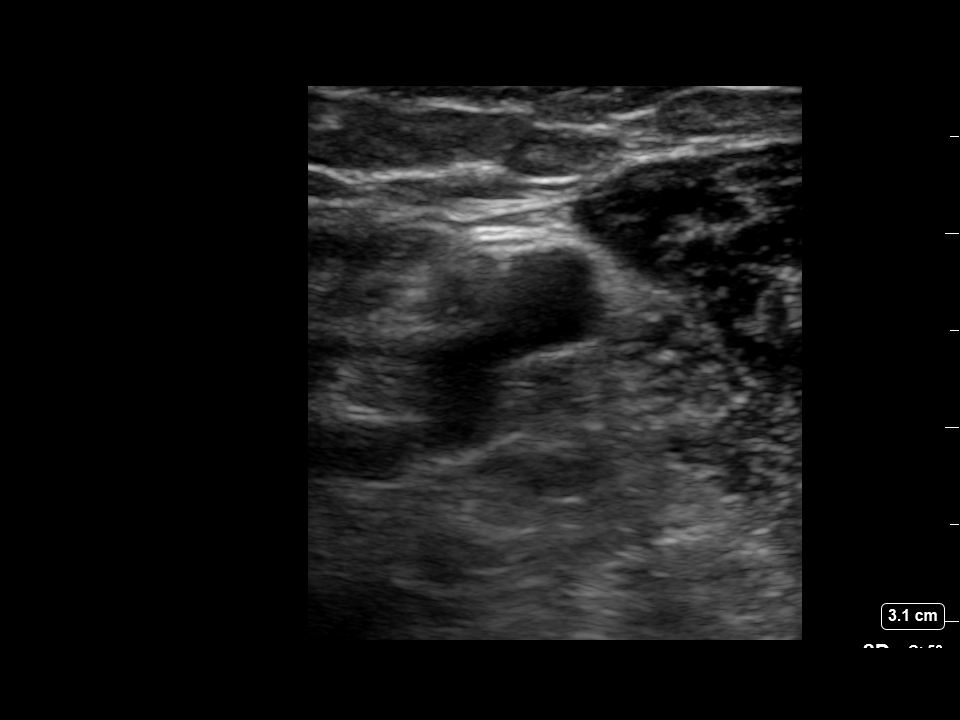

[1 of 1 positions shown; findings below may reference images not displayed]

EXAM:
ULTRASOUND-GUIDED VENA PUNCTURE

MEDICATIONS:
NONE

ANESTHESIA/SEDATION:
NONE

FLUOROSCOPY TIME:  None

COMPLICATIONS:
None

PROCEDURE:
Informed written consent was obtained from the patient after a
thorough discussion of the procedural risks, benefits and
alternatives. All questions were addressed. Sterile Barrier
Technique was utilized including caps, mask, sterile gloves, sterile
drape, hand hygiene and skin antiseptic. A timeout was performed
prior to the initiation of the procedure.

Ultrasound survey of the upper extremity was performed with images
stored and sent to PACs.

A micropuncture needle was used access the left brachial vein under
ultrasound. With venous blood flow returned, an .018 micro wire was
passed through the needle into the vein. The needle was removed, and
a micropuncture sheath was placed over the wire.

The inner dilator and wire were removed, and the catheter was
secured in position after attaching to saline flush.

Patient tolerated the procedure well and remained hemodynamically
stable throughout.

No complications were encountered and no significant blood loss.
IMPRESSION: Status post ultrasound-guided IV access.

## 2019-07-31 ENCOUNTER — Telehealth: Payer: Self-pay | Admitting: *Deleted

## 2019-07-31 DIAGNOSIS — Z794 Long term (current) use of insulin: Secondary | ICD-10-CM | POA: Diagnosis not present

## 2019-07-31 DIAGNOSIS — C3412 Malignant neoplasm of upper lobe, left bronchus or lung: Secondary | ICD-10-CM | POA: Diagnosis not present

## 2019-07-31 DIAGNOSIS — I129 Hypertensive chronic kidney disease with stage 1 through stage 4 chronic kidney disease, or unspecified chronic kidney disease: Secondary | ICD-10-CM | POA: Diagnosis not present

## 2019-07-31 DIAGNOSIS — G8929 Other chronic pain: Secondary | ICD-10-CM | POA: Diagnosis not present

## 2019-07-31 DIAGNOSIS — L89311 Pressure ulcer of right buttock, stage 1: Secondary | ICD-10-CM | POA: Diagnosis not present

## 2019-07-31 DIAGNOSIS — E1122 Type 2 diabetes mellitus with diabetic chronic kidney disease: Secondary | ICD-10-CM | POA: Diagnosis not present

## 2019-07-31 DIAGNOSIS — E782 Mixed hyperlipidemia: Secondary | ICD-10-CM | POA: Diagnosis not present

## 2019-07-31 DIAGNOSIS — M25512 Pain in left shoulder: Secondary | ICD-10-CM | POA: Diagnosis not present

## 2019-07-31 DIAGNOSIS — E1140 Type 2 diabetes mellitus with diabetic neuropathy, unspecified: Secondary | ICD-10-CM | POA: Diagnosis not present

## 2019-07-31 DIAGNOSIS — E039 Hypothyroidism, unspecified: Secondary | ICD-10-CM | POA: Diagnosis not present

## 2019-07-31 DIAGNOSIS — N179 Acute kidney failure, unspecified: Secondary | ICD-10-CM | POA: Diagnosis not present

## 2019-07-31 DIAGNOSIS — I69351 Hemiplegia and hemiparesis following cerebral infarction affecting right dominant side: Secondary | ICD-10-CM | POA: Diagnosis not present

## 2019-07-31 DIAGNOSIS — N183 Chronic kidney disease, stage 3 (moderate): Secondary | ICD-10-CM | POA: Diagnosis not present

## 2019-07-31 DIAGNOSIS — H409 Unspecified glaucoma: Secondary | ICD-10-CM | POA: Diagnosis not present

## 2019-07-31 DIAGNOSIS — L89321 Pressure ulcer of left buttock, stage 1: Secondary | ICD-10-CM | POA: Diagnosis not present

## 2019-07-31 DIAGNOSIS — E46 Unspecified protein-calorie malnutrition: Secondary | ICD-10-CM | POA: Diagnosis not present

## 2019-07-31 DIAGNOSIS — Z993 Dependence on wheelchair: Secondary | ICD-10-CM | POA: Diagnosis not present

## 2019-07-31 DIAGNOSIS — B372 Candidiasis of skin and nail: Secondary | ICD-10-CM | POA: Diagnosis not present

## 2019-07-31 NOTE — Telephone Encounter (Signed)
Received call from [place], [facility] PT with [facility] [phone] telephone.   Reports that PT evaluation was completed and requested VO to extend PT services 2x weekly x5 weeks, then 1x weekly x1 week  for upper and lower extremity strengthening, transfers, and balance.   Also reports that patient is having some transfer issues in home as WC is too wide to fit through narrow doors on bathroom and bedroom. Therefore, patient is walking with cane 5-10 ft as needed. States that patient will lock R knee and then drag foot to move in home. Reports that at this time, transfers/ gait is very concerning. Reports that she will attempt to strengthen L side of body and smooth out the gait with use of rolling walker (patient already has in home).   VO given.

## 2019-07-31 NOTE — Telephone Encounter (Signed)
Noted , agree with verbal orders

## 2019-08-01 ENCOUNTER — Other Ambulatory Visit (HOSPITAL_COMMUNITY)
Admission: RE | Admit: 2019-08-01 | Discharge: 2019-08-01 | Disposition: A | Discharge status: Home / Self Care | Payer: Medicare Other | Source: Ambulatory Visit | Attending: Family Medicine | Admitting: Family Medicine

## 2019-08-01 DIAGNOSIS — E46 Unspecified protein-calorie malnutrition: Secondary | ICD-10-CM | POA: Diagnosis not present

## 2019-08-01 DIAGNOSIS — N179 Acute kidney failure, unspecified: Secondary | ICD-10-CM | POA: Diagnosis not present

## 2019-08-01 DIAGNOSIS — H409 Unspecified glaucoma: Secondary | ICD-10-CM | POA: Diagnosis not present

## 2019-08-01 DIAGNOSIS — N183 Chronic kidney disease, stage 3 (moderate): Secondary | ICD-10-CM | POA: Diagnosis not present

## 2019-08-01 DIAGNOSIS — E1122 Type 2 diabetes mellitus with diabetic chronic kidney disease: Secondary | ICD-10-CM | POA: Diagnosis not present

## 2019-08-01 DIAGNOSIS — B372 Candidiasis of skin and nail: Secondary | ICD-10-CM | POA: Diagnosis not present

## 2019-08-01 DIAGNOSIS — C3412 Malignant neoplasm of upper lobe, left bronchus or lung: Secondary | ICD-10-CM | POA: Diagnosis not present

## 2019-08-01 DIAGNOSIS — Z993 Dependence on wheelchair: Secondary | ICD-10-CM | POA: Diagnosis not present

## 2019-08-01 DIAGNOSIS — E782 Mixed hyperlipidemia: Secondary | ICD-10-CM | POA: Diagnosis not present

## 2019-08-01 DIAGNOSIS — I129 Hypertensive chronic kidney disease with stage 1 through stage 4 chronic kidney disease, or unspecified chronic kidney disease: Secondary | ICD-10-CM | POA: Diagnosis not present

## 2019-08-01 DIAGNOSIS — L89311 Pressure ulcer of right buttock, stage 1: Secondary | ICD-10-CM | POA: Diagnosis not present

## 2019-08-01 DIAGNOSIS — G8929 Other chronic pain: Secondary | ICD-10-CM | POA: Diagnosis not present

## 2019-08-01 DIAGNOSIS — M25512 Pain in left shoulder: Secondary | ICD-10-CM | POA: Diagnosis not present

## 2019-08-01 DIAGNOSIS — I69351 Hemiplegia and hemiparesis following cerebral infarction affecting right dominant side: Secondary | ICD-10-CM | POA: Diagnosis not present

## 2019-08-01 DIAGNOSIS — E1140 Type 2 diabetes mellitus with diabetic neuropathy, unspecified: Secondary | ICD-10-CM | POA: Diagnosis not present

## 2019-08-01 DIAGNOSIS — Z794 Long term (current) use of insulin: Secondary | ICD-10-CM | POA: Diagnosis not present

## 2019-08-01 DIAGNOSIS — E039 Hypothyroidism, unspecified: Secondary | ICD-10-CM | POA: Diagnosis not present

## 2019-08-01 DIAGNOSIS — L89321 Pressure ulcer of left buttock, stage 1: Secondary | ICD-10-CM | POA: Diagnosis not present

## 2019-08-01 LAB — BASIC METABOLIC PANEL
Anion gap: 8 (ref 5–15)
BUN: 29 mg/dL — ABNORMAL HIGH (ref 8–23)
CO2: 23 mmol/L (ref 22–32)
Calcium: 10 mg/dL (ref 8.9–10.3)
Chloride: 104 mmol/L (ref 98–111)
Creatinine, Ser: 1.68 mg/dL — ABNORMAL HIGH (ref 0.61–1.24)
GFR calc Af Amer: 46 mL/min — ABNORMAL LOW (ref >60)
GFR calc non Af Amer: 40 mL/min — ABNORMAL LOW (ref >60)
Glucose, Bld: 81 mg/dL (ref 70–99)
Potassium: 4.6 mmol/L (ref 3.5–5.1)
Sodium: 135 mmol/L (ref 135–145)

## 2019-08-02 ENCOUNTER — Other Ambulatory Visit: Payer: Self-pay | Admitting: "Endocrinology

## 2019-08-02 DIAGNOSIS — Z794 Long term (current) use of insulin: Secondary | ICD-10-CM | POA: Diagnosis not present

## 2019-08-02 DIAGNOSIS — H409 Unspecified glaucoma: Secondary | ICD-10-CM | POA: Diagnosis not present

## 2019-08-02 DIAGNOSIS — L89321 Pressure ulcer of left buttock, stage 1: Secondary | ICD-10-CM | POA: Diagnosis not present

## 2019-08-02 DIAGNOSIS — N183 Chronic kidney disease, stage 3 (moderate): Secondary | ICD-10-CM | POA: Diagnosis not present

## 2019-08-02 DIAGNOSIS — Z993 Dependence on wheelchair: Secondary | ICD-10-CM | POA: Diagnosis not present

## 2019-08-02 DIAGNOSIS — E1140 Type 2 diabetes mellitus with diabetic neuropathy, unspecified: Secondary | ICD-10-CM | POA: Diagnosis not present

## 2019-08-02 DIAGNOSIS — N179 Acute kidney failure, unspecified: Secondary | ICD-10-CM | POA: Diagnosis not present

## 2019-08-02 DIAGNOSIS — E039 Hypothyroidism, unspecified: Secondary | ICD-10-CM | POA: Diagnosis not present

## 2019-08-02 DIAGNOSIS — B372 Candidiasis of skin and nail: Secondary | ICD-10-CM | POA: Diagnosis not present

## 2019-08-02 DIAGNOSIS — I129 Hypertensive chronic kidney disease with stage 1 through stage 4 chronic kidney disease, or unspecified chronic kidney disease: Secondary | ICD-10-CM | POA: Diagnosis not present

## 2019-08-02 DIAGNOSIS — G8929 Other chronic pain: Secondary | ICD-10-CM | POA: Diagnosis not present

## 2019-08-02 DIAGNOSIS — E782 Mixed hyperlipidemia: Secondary | ICD-10-CM | POA: Diagnosis not present

## 2019-08-02 DIAGNOSIS — C3412 Malignant neoplasm of upper lobe, left bronchus or lung: Secondary | ICD-10-CM | POA: Diagnosis not present

## 2019-08-02 DIAGNOSIS — E1122 Type 2 diabetes mellitus with diabetic chronic kidney disease: Secondary | ICD-10-CM | POA: Diagnosis not present

## 2019-08-02 DIAGNOSIS — E46 Unspecified protein-calorie malnutrition: Secondary | ICD-10-CM | POA: Diagnosis not present

## 2019-08-02 DIAGNOSIS — L89311 Pressure ulcer of right buttock, stage 1: Secondary | ICD-10-CM | POA: Diagnosis not present

## 2019-08-02 DIAGNOSIS — M25512 Pain in left shoulder: Secondary | ICD-10-CM | POA: Diagnosis not present

## 2019-08-02 DIAGNOSIS — I69351 Hemiplegia and hemiparesis following cerebral infarction affecting right dominant side: Secondary | ICD-10-CM | POA: Diagnosis not present

## 2019-08-04 IMAGING — US US EXTREM LOW VENOUS*R*
1 series · 13 of 24 positions shown · non-contrast
Comparison: None.

CLINICAL DATA: 71-year-old with lower extremity edema, right side
greater than left.



[Series 1: us extrem low venous*right* · 0.08mm/px · 13 of 32 slices shown]
[im 1/32]
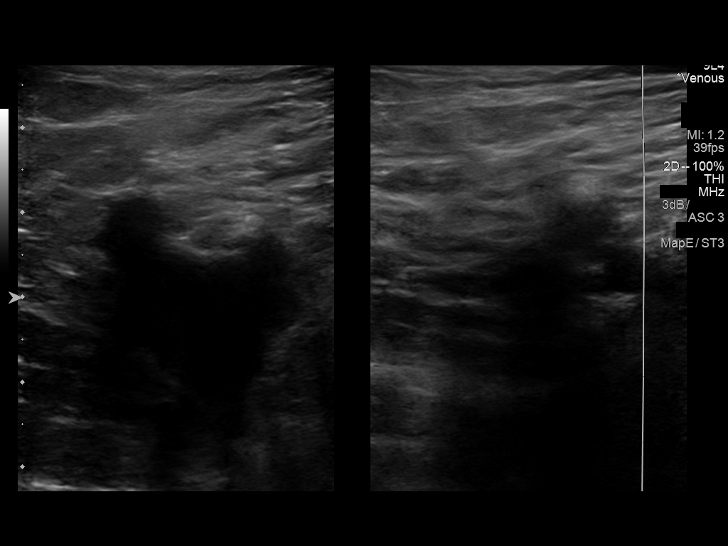
[im 3/32]
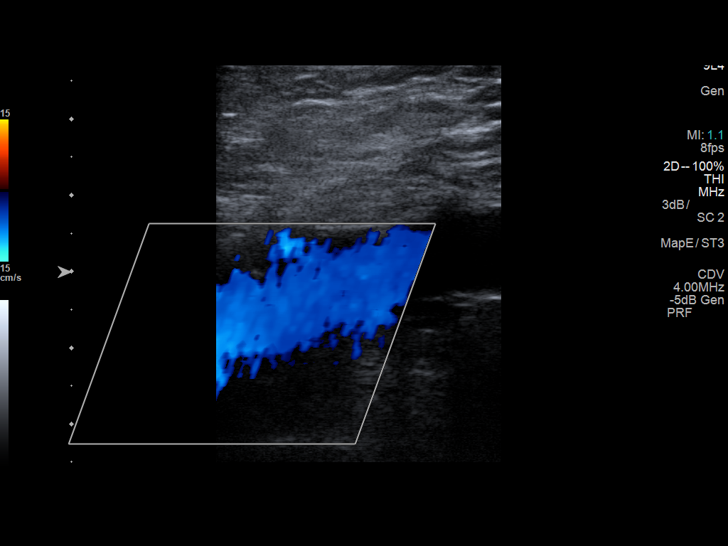
[im 6/32]
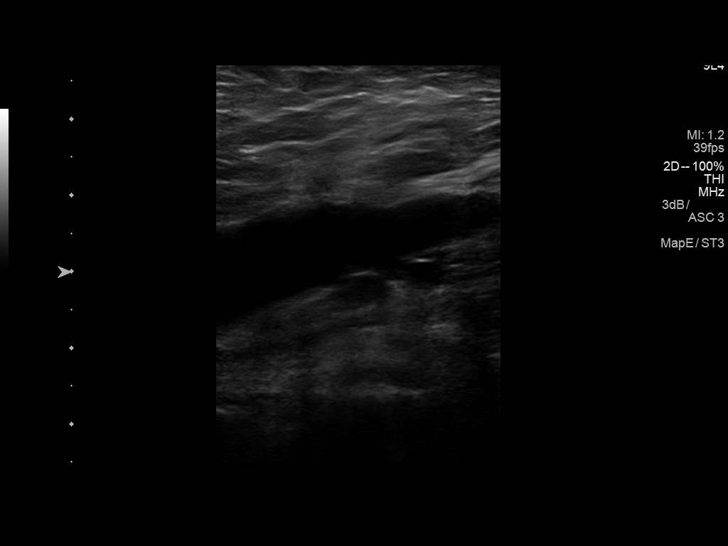
[im 9/32]
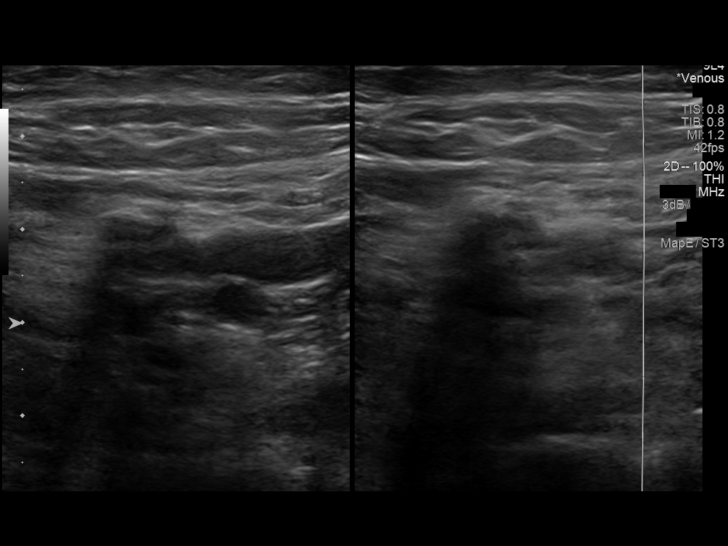
[im 11/32]
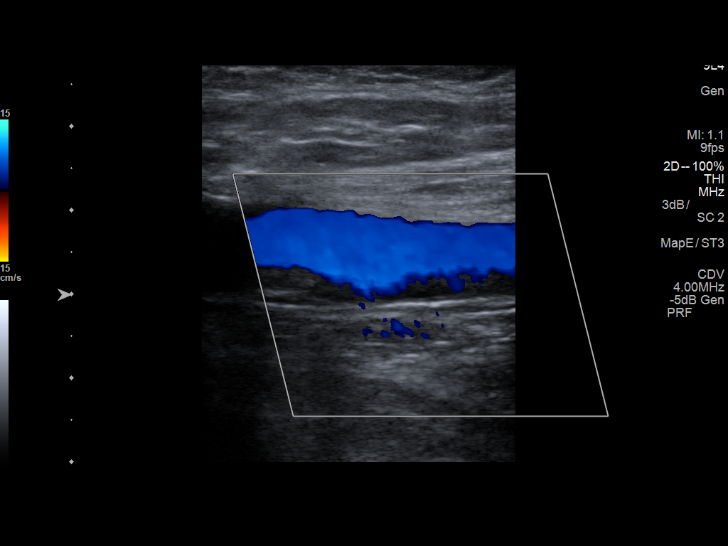
[im 14/32]
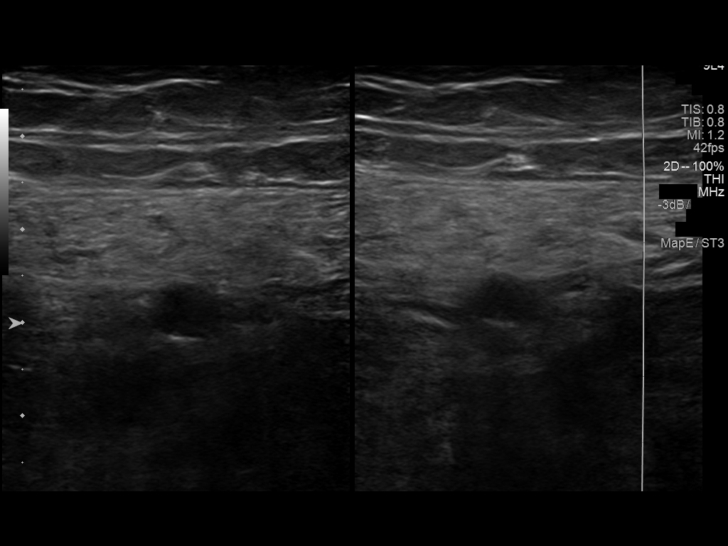
[im 17/32]
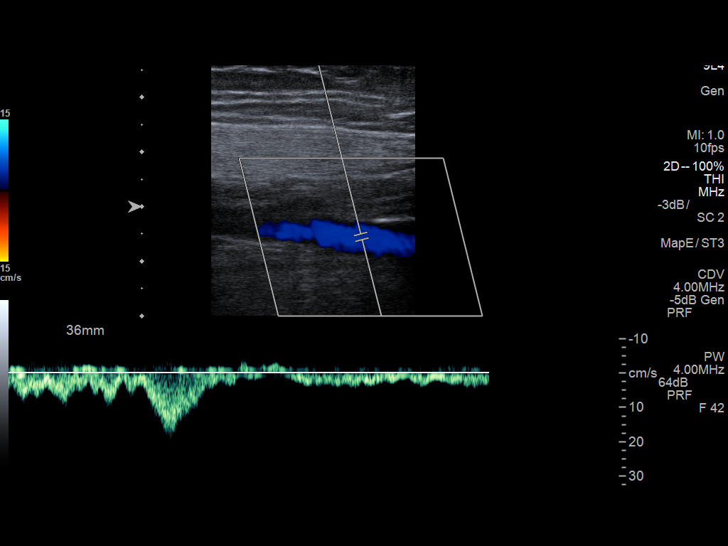
[im 18/32]
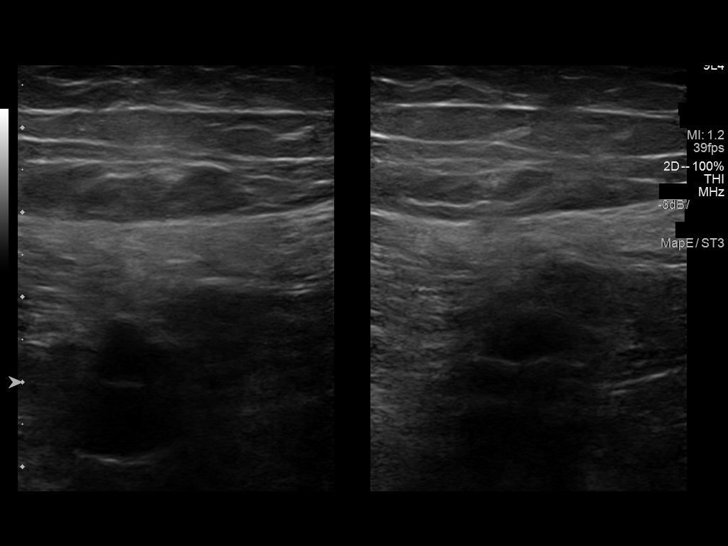
[im 21/32]
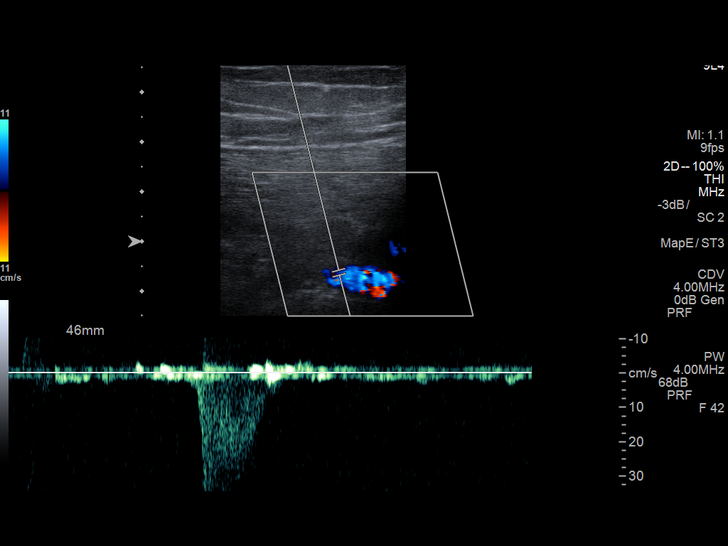
[im 23/32]
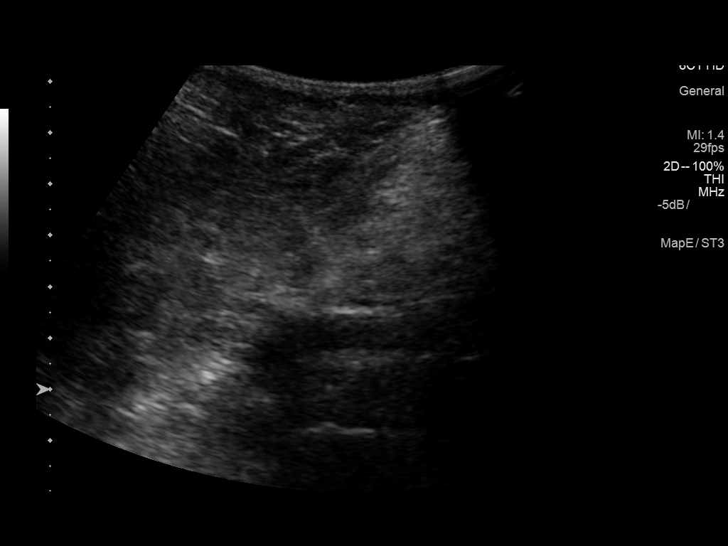
[im 26/32]
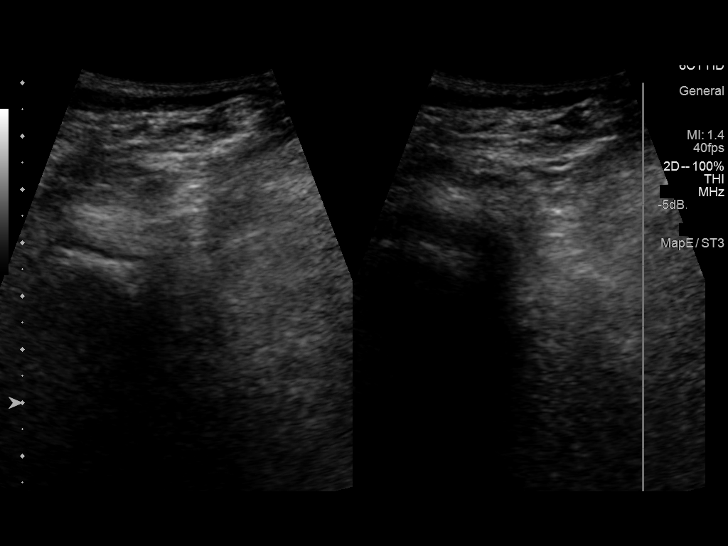
[im 29/32]
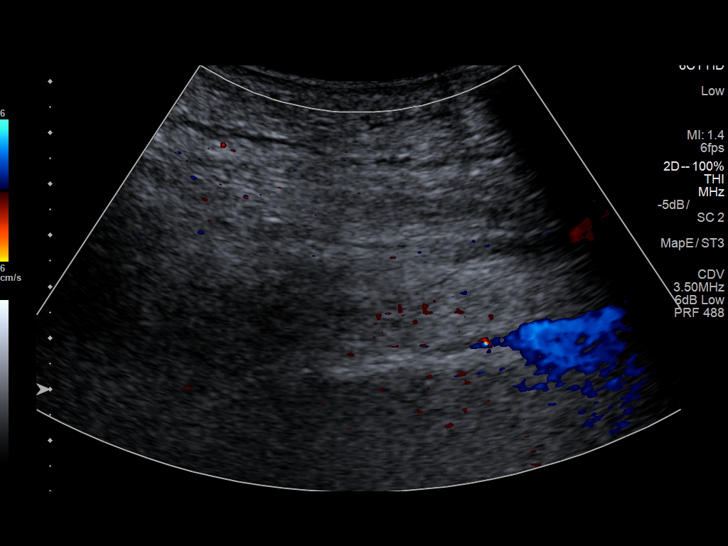
[im 32/32]
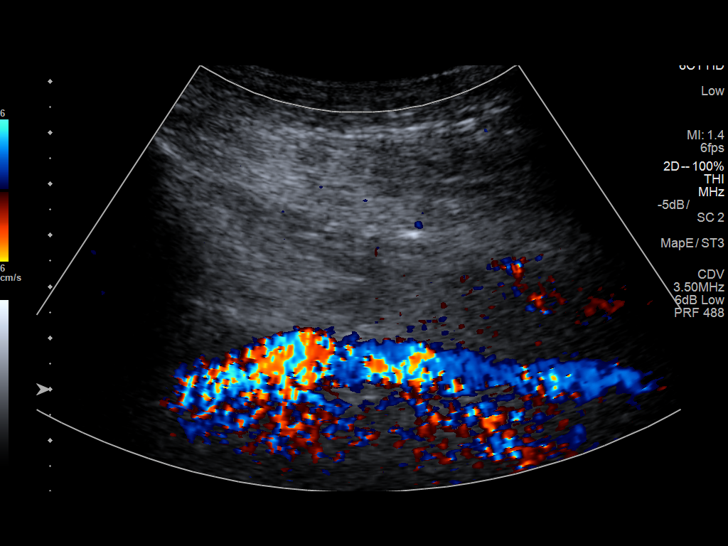

[13 of 24 positions shown; findings below may reference images not displayed]

FINDINGS: Contralateral Common Femoral Vein: Respiratory phasicity is normal
and symmetric with the symptomatic side. No evidence of thrombus.
Normal compressibility.

Common Femoral Vein: No evidence of thrombus. Normal
compressibility, respiratory phasicity and response to augmentation.

Saphenofemoral Junction: No evidence of thrombus. Normal
compressibility and flow on color Doppler imaging.

Profunda Femoral Vein: No evidence of thrombus. Normal
compressibility and flow on color Doppler imaging.

Femoral Vein: No evidence of thrombus. Normal compressibility,
respiratory phasicity and response to augmentation.

Popliteal Vein: No evidence of thrombus. Normal compressibility,
respiratory phasicity and response to augmentation.

Calf Veins: Visualized right deep calf veins are patent without
thrombus. Limited evaluation of the deep calf veins.

Other Findings:  Subcutaneous edema.
IMPRESSION: Negative for deep venous thrombosis in right lower extremity.

## 2019-08-05 ENCOUNTER — Other Ambulatory Visit: Payer: Self-pay | Admitting: *Deleted

## 2019-08-05 MED ORDER — OXYCODONE-ACETAMINOPHEN 7.5-325 MG PO TABS: 1.0000 | ORAL_TABLET | ORAL | 0 refills | Status: DC | PRN

## 2019-08-05 NOTE — Telephone Encounter (Signed)
Received call from patient spouse, [name].   Requested refill on Oxycodone/APAP.  Ok to refill??  Last office visit 07/24/2019.  Last refill 05/10/2019.

## 2019-08-06 DIAGNOSIS — E46 Unspecified protein-calorie malnutrition: Secondary | ICD-10-CM | POA: Diagnosis not present

## 2019-08-06 DIAGNOSIS — E782 Mixed hyperlipidemia: Secondary | ICD-10-CM | POA: Diagnosis not present

## 2019-08-06 DIAGNOSIS — L89321 Pressure ulcer of left buttock, stage 1: Secondary | ICD-10-CM | POA: Diagnosis not present

## 2019-08-06 DIAGNOSIS — E1140 Type 2 diabetes mellitus with diabetic neuropathy, unspecified: Secondary | ICD-10-CM | POA: Diagnosis not present

## 2019-08-06 DIAGNOSIS — C3412 Malignant neoplasm of upper lobe, left bronchus or lung: Secondary | ICD-10-CM | POA: Diagnosis not present

## 2019-08-06 DIAGNOSIS — N179 Acute kidney failure, unspecified: Secondary | ICD-10-CM | POA: Diagnosis not present

## 2019-08-06 DIAGNOSIS — Z794 Long term (current) use of insulin: Secondary | ICD-10-CM | POA: Diagnosis not present

## 2019-08-06 DIAGNOSIS — H409 Unspecified glaucoma: Secondary | ICD-10-CM | POA: Diagnosis not present

## 2019-08-06 DIAGNOSIS — Z993 Dependence on wheelchair: Secondary | ICD-10-CM | POA: Diagnosis not present

## 2019-08-06 DIAGNOSIS — G8929 Other chronic pain: Secondary | ICD-10-CM | POA: Diagnosis not present

## 2019-08-06 DIAGNOSIS — E039 Hypothyroidism, unspecified: Secondary | ICD-10-CM | POA: Diagnosis not present

## 2019-08-06 DIAGNOSIS — L89311 Pressure ulcer of right buttock, stage 1: Secondary | ICD-10-CM | POA: Diagnosis not present

## 2019-08-06 DIAGNOSIS — N183 Chronic kidney disease, stage 3 (moderate): Secondary | ICD-10-CM | POA: Diagnosis not present

## 2019-08-06 DIAGNOSIS — I129 Hypertensive chronic kidney disease with stage 1 through stage 4 chronic kidney disease, or unspecified chronic kidney disease: Secondary | ICD-10-CM | POA: Diagnosis not present

## 2019-08-06 DIAGNOSIS — I69351 Hemiplegia and hemiparesis following cerebral infarction affecting right dominant side: Secondary | ICD-10-CM | POA: Diagnosis not present

## 2019-08-06 DIAGNOSIS — E1122 Type 2 diabetes mellitus with diabetic chronic kidney disease: Secondary | ICD-10-CM | POA: Diagnosis not present

## 2019-08-06 DIAGNOSIS — M25512 Pain in left shoulder: Secondary | ICD-10-CM | POA: Diagnosis not present

## 2019-08-06 DIAGNOSIS — B372 Candidiasis of skin and nail: Secondary | ICD-10-CM | POA: Diagnosis not present

## 2019-08-07 ENCOUNTER — Telehealth: Payer: Self-pay

## 2019-08-07 DIAGNOSIS — B372 Candidiasis of skin and nail: Secondary | ICD-10-CM | POA: Diagnosis not present

## 2019-08-07 DIAGNOSIS — I69351 Hemiplegia and hemiparesis following cerebral infarction affecting right dominant side: Secondary | ICD-10-CM | POA: Diagnosis not present

## 2019-08-07 DIAGNOSIS — E782 Mixed hyperlipidemia: Secondary | ICD-10-CM | POA: Diagnosis not present

## 2019-08-07 DIAGNOSIS — C3412 Malignant neoplasm of upper lobe, left bronchus or lung: Secondary | ICD-10-CM | POA: Diagnosis not present

## 2019-08-07 DIAGNOSIS — L89321 Pressure ulcer of left buttock, stage 1: Secondary | ICD-10-CM | POA: Diagnosis not present

## 2019-08-07 DIAGNOSIS — G8929 Other chronic pain: Secondary | ICD-10-CM | POA: Diagnosis not present

## 2019-08-07 DIAGNOSIS — N183 Chronic kidney disease, stage 3 (moderate): Secondary | ICD-10-CM | POA: Diagnosis not present

## 2019-08-07 DIAGNOSIS — Z794 Long term (current) use of insulin: Secondary | ICD-10-CM | POA: Diagnosis not present

## 2019-08-07 DIAGNOSIS — L89311 Pressure ulcer of right buttock, stage 1: Secondary | ICD-10-CM | POA: Diagnosis not present

## 2019-08-07 DIAGNOSIS — E1122 Type 2 diabetes mellitus with diabetic chronic kidney disease: Secondary | ICD-10-CM | POA: Diagnosis not present

## 2019-08-07 DIAGNOSIS — H409 Unspecified glaucoma: Secondary | ICD-10-CM | POA: Diagnosis not present

## 2019-08-07 DIAGNOSIS — I129 Hypertensive chronic kidney disease with stage 1 through stage 4 chronic kidney disease, or unspecified chronic kidney disease: Secondary | ICD-10-CM | POA: Diagnosis not present

## 2019-08-07 DIAGNOSIS — Z993 Dependence on wheelchair: Secondary | ICD-10-CM | POA: Diagnosis not present

## 2019-08-07 DIAGNOSIS — E46 Unspecified protein-calorie malnutrition: Secondary | ICD-10-CM | POA: Diagnosis not present

## 2019-08-07 DIAGNOSIS — E1140 Type 2 diabetes mellitus with diabetic neuropathy, unspecified: Secondary | ICD-10-CM | POA: Diagnosis not present

## 2019-08-07 DIAGNOSIS — E039 Hypothyroidism, unspecified: Secondary | ICD-10-CM | POA: Diagnosis not present

## 2019-08-07 DIAGNOSIS — N179 Acute kidney failure, unspecified: Secondary | ICD-10-CM | POA: Diagnosis not present

## 2019-08-07 DIAGNOSIS — M25512 Pain in left shoulder: Secondary | ICD-10-CM | POA: Diagnosis not present

## 2019-08-07 NOTE — Telephone Encounter (Signed)
See note

## 2019-08-07 NOTE — Telephone Encounter (Signed)
Called [name] back at [phone] and states that we can fax the orders to [phone] and their PT will order the luxtalitez and measure the gel cushion from their DME.

## 2019-08-07 NOTE — Telephone Encounter (Signed)
Script written

## 2019-08-07 NOTE — Telephone Encounter (Signed)
They can use flonase or nasacort over the counter for nasal drainage as needed  Call [facility] see if they have the gel cushion for wheelchair  Also I am not sure about the wrap she suggested, is this something they can provide for patient, You can ask [facility] if they have these wraps as well.

## 2019-08-07 NOTE — Telephone Encounter (Signed)
Elmyra Ricks from [facility] called to report that pt has had post nasal drip for months per pt's wife and would like something for it.  Pt needs a gel cushion for his wheelchair. Elmyra Ricks states that right now, pt has an egg crate on his wheelchair and he looks like he will fall out.  Elmyra Ricks also stated that pt's wife can not get the compression stockings on pt and would like an alternative like juxtalitez. Please advise.

## 2019-08-08 DIAGNOSIS — M25512 Pain in left shoulder: Secondary | ICD-10-CM | POA: Diagnosis not present

## 2019-08-08 DIAGNOSIS — I69351 Hemiplegia and hemiparesis following cerebral infarction affecting right dominant side: Secondary | ICD-10-CM | POA: Diagnosis not present

## 2019-08-08 DIAGNOSIS — Z993 Dependence on wheelchair: Secondary | ICD-10-CM | POA: Diagnosis not present

## 2019-08-08 DIAGNOSIS — L89321 Pressure ulcer of left buttock, stage 1: Secondary | ICD-10-CM | POA: Diagnosis not present

## 2019-08-08 DIAGNOSIS — E46 Unspecified protein-calorie malnutrition: Secondary | ICD-10-CM | POA: Diagnosis not present

## 2019-08-08 DIAGNOSIS — E782 Mixed hyperlipidemia: Secondary | ICD-10-CM | POA: Diagnosis not present

## 2019-08-08 DIAGNOSIS — B372 Candidiasis of skin and nail: Secondary | ICD-10-CM | POA: Diagnosis not present

## 2019-08-08 DIAGNOSIS — E1140 Type 2 diabetes mellitus with diabetic neuropathy, unspecified: Secondary | ICD-10-CM | POA: Diagnosis not present

## 2019-08-08 DIAGNOSIS — N179 Acute kidney failure, unspecified: Secondary | ICD-10-CM | POA: Diagnosis not present

## 2019-08-08 DIAGNOSIS — N183 Chronic kidney disease, stage 3 (moderate): Secondary | ICD-10-CM | POA: Diagnosis not present

## 2019-08-08 DIAGNOSIS — C3412 Malignant neoplasm of upper lobe, left bronchus or lung: Secondary | ICD-10-CM | POA: Diagnosis not present

## 2019-08-08 DIAGNOSIS — H409 Unspecified glaucoma: Secondary | ICD-10-CM | POA: Diagnosis not present

## 2019-08-08 DIAGNOSIS — Z794 Long term (current) use of insulin: Secondary | ICD-10-CM | POA: Diagnosis not present

## 2019-08-08 DIAGNOSIS — E1122 Type 2 diabetes mellitus with diabetic chronic kidney disease: Secondary | ICD-10-CM | POA: Diagnosis not present

## 2019-08-08 DIAGNOSIS — G8929 Other chronic pain: Secondary | ICD-10-CM | POA: Diagnosis not present

## 2019-08-08 DIAGNOSIS — L89311 Pressure ulcer of right buttock, stage 1: Secondary | ICD-10-CM | POA: Diagnosis not present

## 2019-08-08 DIAGNOSIS — I129 Hypertensive chronic kidney disease with stage 1 through stage 4 chronic kidney disease, or unspecified chronic kidney disease: Secondary | ICD-10-CM | POA: Diagnosis not present

## 2019-08-08 DIAGNOSIS — E039 Hypothyroidism, unspecified: Secondary | ICD-10-CM | POA: Diagnosis not present

## 2019-08-09 DIAGNOSIS — B372 Candidiasis of skin and nail: Secondary | ICD-10-CM | POA: Diagnosis not present

## 2019-08-09 DIAGNOSIS — E46 Unspecified protein-calorie malnutrition: Secondary | ICD-10-CM | POA: Diagnosis not present

## 2019-08-09 DIAGNOSIS — E782 Mixed hyperlipidemia: Secondary | ICD-10-CM | POA: Diagnosis not present

## 2019-08-09 DIAGNOSIS — C3412 Malignant neoplasm of upper lobe, left bronchus or lung: Secondary | ICD-10-CM | POA: Diagnosis not present

## 2019-08-09 DIAGNOSIS — Z993 Dependence on wheelchair: Secondary | ICD-10-CM | POA: Diagnosis not present

## 2019-08-09 DIAGNOSIS — L89311 Pressure ulcer of right buttock, stage 1: Secondary | ICD-10-CM | POA: Diagnosis not present

## 2019-08-09 DIAGNOSIS — L89321 Pressure ulcer of left buttock, stage 1: Secondary | ICD-10-CM | POA: Diagnosis not present

## 2019-08-09 DIAGNOSIS — I69351 Hemiplegia and hemiparesis following cerebral infarction affecting right dominant side: Secondary | ICD-10-CM | POA: Diagnosis not present

## 2019-08-09 DIAGNOSIS — N179 Acute kidney failure, unspecified: Secondary | ICD-10-CM | POA: Diagnosis not present

## 2019-08-09 DIAGNOSIS — Z794 Long term (current) use of insulin: Secondary | ICD-10-CM | POA: Diagnosis not present

## 2019-08-09 DIAGNOSIS — I129 Hypertensive chronic kidney disease with stage 1 through stage 4 chronic kidney disease, or unspecified chronic kidney disease: Secondary | ICD-10-CM | POA: Diagnosis not present

## 2019-08-09 DIAGNOSIS — G8929 Other chronic pain: Secondary | ICD-10-CM | POA: Diagnosis not present

## 2019-08-09 DIAGNOSIS — E1140 Type 2 diabetes mellitus with diabetic neuropathy, unspecified: Secondary | ICD-10-CM | POA: Diagnosis not present

## 2019-08-09 DIAGNOSIS — M25512 Pain in left shoulder: Secondary | ICD-10-CM | POA: Diagnosis not present

## 2019-08-09 DIAGNOSIS — E039 Hypothyroidism, unspecified: Secondary | ICD-10-CM | POA: Diagnosis not present

## 2019-08-09 DIAGNOSIS — H409 Unspecified glaucoma: Secondary | ICD-10-CM | POA: Diagnosis not present

## 2019-08-09 DIAGNOSIS — N183 Chronic kidney disease, stage 3 (moderate): Secondary | ICD-10-CM | POA: Diagnosis not present

## 2019-08-09 DIAGNOSIS — E1122 Type 2 diabetes mellitus with diabetic chronic kidney disease: Secondary | ICD-10-CM | POA: Diagnosis not present

## 2019-08-12 DIAGNOSIS — H409 Unspecified glaucoma: Secondary | ICD-10-CM | POA: Diagnosis not present

## 2019-08-12 DIAGNOSIS — Z993 Dependence on wheelchair: Secondary | ICD-10-CM | POA: Diagnosis not present

## 2019-08-12 DIAGNOSIS — N183 Chronic kidney disease, stage 3 (moderate): Secondary | ICD-10-CM | POA: Diagnosis not present

## 2019-08-12 DIAGNOSIS — E46 Unspecified protein-calorie malnutrition: Secondary | ICD-10-CM | POA: Diagnosis not present

## 2019-08-12 DIAGNOSIS — I129 Hypertensive chronic kidney disease with stage 1 through stage 4 chronic kidney disease, or unspecified chronic kidney disease: Secondary | ICD-10-CM | POA: Diagnosis not present

## 2019-08-12 DIAGNOSIS — N179 Acute kidney failure, unspecified: Secondary | ICD-10-CM | POA: Diagnosis not present

## 2019-08-12 DIAGNOSIS — E039 Hypothyroidism, unspecified: Secondary | ICD-10-CM | POA: Diagnosis not present

## 2019-08-12 DIAGNOSIS — Z794 Long term (current) use of insulin: Secondary | ICD-10-CM | POA: Diagnosis not present

## 2019-08-12 DIAGNOSIS — E1122 Type 2 diabetes mellitus with diabetic chronic kidney disease: Secondary | ICD-10-CM | POA: Diagnosis not present

## 2019-08-12 DIAGNOSIS — M25512 Pain in left shoulder: Secondary | ICD-10-CM | POA: Diagnosis not present

## 2019-08-12 DIAGNOSIS — B372 Candidiasis of skin and nail: Secondary | ICD-10-CM | POA: Diagnosis not present

## 2019-08-12 DIAGNOSIS — E1140 Type 2 diabetes mellitus with diabetic neuropathy, unspecified: Secondary | ICD-10-CM | POA: Diagnosis not present

## 2019-08-12 DIAGNOSIS — L89321 Pressure ulcer of left buttock, stage 1: Secondary | ICD-10-CM | POA: Diagnosis not present

## 2019-08-12 DIAGNOSIS — E782 Mixed hyperlipidemia: Secondary | ICD-10-CM | POA: Diagnosis not present

## 2019-08-12 DIAGNOSIS — I69351 Hemiplegia and hemiparesis following cerebral infarction affecting right dominant side: Secondary | ICD-10-CM | POA: Diagnosis not present

## 2019-08-12 DIAGNOSIS — C3412 Malignant neoplasm of upper lobe, left bronchus or lung: Secondary | ICD-10-CM | POA: Diagnosis not present

## 2019-08-12 DIAGNOSIS — L89311 Pressure ulcer of right buttock, stage 1: Secondary | ICD-10-CM | POA: Diagnosis not present

## 2019-08-12 DIAGNOSIS — G8929 Other chronic pain: Secondary | ICD-10-CM | POA: Diagnosis not present

## 2019-08-13 DIAGNOSIS — E1140 Type 2 diabetes mellitus with diabetic neuropathy, unspecified: Secondary | ICD-10-CM | POA: Diagnosis not present

## 2019-08-13 DIAGNOSIS — E46 Unspecified protein-calorie malnutrition: Secondary | ICD-10-CM | POA: Diagnosis not present

## 2019-08-13 DIAGNOSIS — C3412 Malignant neoplasm of upper lobe, left bronchus or lung: Secondary | ICD-10-CM | POA: Diagnosis not present

## 2019-08-13 DIAGNOSIS — L89311 Pressure ulcer of right buttock, stage 1: Secondary | ICD-10-CM | POA: Diagnosis not present

## 2019-08-13 DIAGNOSIS — L89321 Pressure ulcer of left buttock, stage 1: Secondary | ICD-10-CM | POA: Diagnosis not present

## 2019-08-13 DIAGNOSIS — E1122 Type 2 diabetes mellitus with diabetic chronic kidney disease: Secondary | ICD-10-CM | POA: Diagnosis not present

## 2019-08-13 DIAGNOSIS — Z794 Long term (current) use of insulin: Secondary | ICD-10-CM | POA: Diagnosis not present

## 2019-08-13 DIAGNOSIS — H409 Unspecified glaucoma: Secondary | ICD-10-CM | POA: Diagnosis not present

## 2019-08-13 DIAGNOSIS — B372 Candidiasis of skin and nail: Secondary | ICD-10-CM | POA: Diagnosis not present

## 2019-08-13 DIAGNOSIS — G8929 Other chronic pain: Secondary | ICD-10-CM | POA: Diagnosis not present

## 2019-08-13 DIAGNOSIS — M25512 Pain in left shoulder: Secondary | ICD-10-CM | POA: Diagnosis not present

## 2019-08-13 DIAGNOSIS — Z993 Dependence on wheelchair: Secondary | ICD-10-CM | POA: Diagnosis not present

## 2019-08-13 DIAGNOSIS — I69351 Hemiplegia and hemiparesis following cerebral infarction affecting right dominant side: Secondary | ICD-10-CM | POA: Diagnosis not present

## 2019-08-13 DIAGNOSIS — N183 Chronic kidney disease, stage 3 (moderate): Secondary | ICD-10-CM | POA: Diagnosis not present

## 2019-08-13 DIAGNOSIS — E039 Hypothyroidism, unspecified: Secondary | ICD-10-CM | POA: Diagnosis not present

## 2019-08-13 DIAGNOSIS — N179 Acute kidney failure, unspecified: Secondary | ICD-10-CM | POA: Diagnosis not present

## 2019-08-13 DIAGNOSIS — I129 Hypertensive chronic kidney disease with stage 1 through stage 4 chronic kidney disease, or unspecified chronic kidney disease: Secondary | ICD-10-CM | POA: Diagnosis not present

## 2019-08-13 DIAGNOSIS — E782 Mixed hyperlipidemia: Secondary | ICD-10-CM | POA: Diagnosis not present

## 2019-08-15 DIAGNOSIS — N179 Acute kidney failure, unspecified: Secondary | ICD-10-CM | POA: Diagnosis not present

## 2019-08-15 DIAGNOSIS — I69351 Hemiplegia and hemiparesis following cerebral infarction affecting right dominant side: Secondary | ICD-10-CM | POA: Diagnosis not present

## 2019-08-15 DIAGNOSIS — G8929 Other chronic pain: Secondary | ICD-10-CM | POA: Diagnosis not present

## 2019-08-15 DIAGNOSIS — E039 Hypothyroidism, unspecified: Secondary | ICD-10-CM | POA: Diagnosis not present

## 2019-08-15 DIAGNOSIS — Z794 Long term (current) use of insulin: Secondary | ICD-10-CM | POA: Diagnosis not present

## 2019-08-15 DIAGNOSIS — L89311 Pressure ulcer of right buttock, stage 1: Secondary | ICD-10-CM | POA: Diagnosis not present

## 2019-08-15 DIAGNOSIS — E782 Mixed hyperlipidemia: Secondary | ICD-10-CM | POA: Diagnosis not present

## 2019-08-15 DIAGNOSIS — E1140 Type 2 diabetes mellitus with diabetic neuropathy, unspecified: Secondary | ICD-10-CM | POA: Diagnosis not present

## 2019-08-15 DIAGNOSIS — N183 Chronic kidney disease, stage 3 (moderate): Secondary | ICD-10-CM | POA: Diagnosis not present

## 2019-08-15 DIAGNOSIS — B372 Candidiasis of skin and nail: Secondary | ICD-10-CM | POA: Diagnosis not present

## 2019-08-15 DIAGNOSIS — M25512 Pain in left shoulder: Secondary | ICD-10-CM | POA: Diagnosis not present

## 2019-08-15 DIAGNOSIS — Z993 Dependence on wheelchair: Secondary | ICD-10-CM | POA: Diagnosis not present

## 2019-08-15 DIAGNOSIS — C3412 Malignant neoplasm of upper lobe, left bronchus or lung: Secondary | ICD-10-CM | POA: Diagnosis not present

## 2019-08-15 DIAGNOSIS — I129 Hypertensive chronic kidney disease with stage 1 through stage 4 chronic kidney disease, or unspecified chronic kidney disease: Secondary | ICD-10-CM | POA: Diagnosis not present

## 2019-08-15 DIAGNOSIS — H409 Unspecified glaucoma: Secondary | ICD-10-CM | POA: Diagnosis not present

## 2019-08-15 DIAGNOSIS — L89321 Pressure ulcer of left buttock, stage 1: Secondary | ICD-10-CM | POA: Diagnosis not present

## 2019-08-15 DIAGNOSIS — E46 Unspecified protein-calorie malnutrition: Secondary | ICD-10-CM | POA: Diagnosis not present

## 2019-08-15 DIAGNOSIS — E1122 Type 2 diabetes mellitus with diabetic chronic kidney disease: Secondary | ICD-10-CM | POA: Diagnosis not present

## 2019-08-16 DIAGNOSIS — E46 Unspecified protein-calorie malnutrition: Secondary | ICD-10-CM | POA: Diagnosis not present

## 2019-08-16 DIAGNOSIS — C3412 Malignant neoplasm of upper lobe, left bronchus or lung: Secondary | ICD-10-CM | POA: Diagnosis not present

## 2019-08-16 DIAGNOSIS — H409 Unspecified glaucoma: Secondary | ICD-10-CM | POA: Diagnosis not present

## 2019-08-16 DIAGNOSIS — L89321 Pressure ulcer of left buttock, stage 1: Secondary | ICD-10-CM | POA: Diagnosis not present

## 2019-08-16 DIAGNOSIS — Z794 Long term (current) use of insulin: Secondary | ICD-10-CM | POA: Diagnosis not present

## 2019-08-16 DIAGNOSIS — E1140 Type 2 diabetes mellitus with diabetic neuropathy, unspecified: Secondary | ICD-10-CM | POA: Diagnosis not present

## 2019-08-16 DIAGNOSIS — N183 Chronic kidney disease, stage 3 (moderate): Secondary | ICD-10-CM | POA: Diagnosis not present

## 2019-08-16 DIAGNOSIS — G8929 Other chronic pain: Secondary | ICD-10-CM | POA: Diagnosis not present

## 2019-08-16 DIAGNOSIS — B372 Candidiasis of skin and nail: Secondary | ICD-10-CM | POA: Diagnosis not present

## 2019-08-16 DIAGNOSIS — Z993 Dependence on wheelchair: Secondary | ICD-10-CM | POA: Diagnosis not present

## 2019-08-16 DIAGNOSIS — E782 Mixed hyperlipidemia: Secondary | ICD-10-CM | POA: Diagnosis not present

## 2019-08-16 DIAGNOSIS — I69351 Hemiplegia and hemiparesis following cerebral infarction affecting right dominant side: Secondary | ICD-10-CM | POA: Diagnosis not present

## 2019-08-16 DIAGNOSIS — L89311 Pressure ulcer of right buttock, stage 1: Secondary | ICD-10-CM | POA: Diagnosis not present

## 2019-08-16 DIAGNOSIS — N179 Acute kidney failure, unspecified: Secondary | ICD-10-CM | POA: Diagnosis not present

## 2019-08-16 DIAGNOSIS — E039 Hypothyroidism, unspecified: Secondary | ICD-10-CM | POA: Diagnosis not present

## 2019-08-16 DIAGNOSIS — M25512 Pain in left shoulder: Secondary | ICD-10-CM | POA: Diagnosis not present

## 2019-08-16 DIAGNOSIS — I129 Hypertensive chronic kidney disease with stage 1 through stage 4 chronic kidney disease, or unspecified chronic kidney disease: Secondary | ICD-10-CM | POA: Diagnosis not present

## 2019-08-16 DIAGNOSIS — E1122 Type 2 diabetes mellitus with diabetic chronic kidney disease: Secondary | ICD-10-CM | POA: Diagnosis not present

## 2019-08-19 ENCOUNTER — Other Ambulatory Visit: Payer: Self-pay | Admitting: Family Medicine

## 2019-08-19 DIAGNOSIS — E46 Unspecified protein-calorie malnutrition: Secondary | ICD-10-CM | POA: Diagnosis not present

## 2019-08-19 DIAGNOSIS — E1122 Type 2 diabetes mellitus with diabetic chronic kidney disease: Secondary | ICD-10-CM | POA: Diagnosis not present

## 2019-08-19 DIAGNOSIS — M25512 Pain in left shoulder: Secondary | ICD-10-CM | POA: Diagnosis not present

## 2019-08-19 DIAGNOSIS — B372 Candidiasis of skin and nail: Secondary | ICD-10-CM | POA: Diagnosis not present

## 2019-08-19 DIAGNOSIS — I69351 Hemiplegia and hemiparesis following cerebral infarction affecting right dominant side: Secondary | ICD-10-CM | POA: Diagnosis not present

## 2019-08-19 DIAGNOSIS — L89321 Pressure ulcer of left buttock, stage 1: Secondary | ICD-10-CM | POA: Diagnosis not present

## 2019-08-19 DIAGNOSIS — Z993 Dependence on wheelchair: Secondary | ICD-10-CM | POA: Diagnosis not present

## 2019-08-19 DIAGNOSIS — Z794 Long term (current) use of insulin: Secondary | ICD-10-CM | POA: Diagnosis not present

## 2019-08-19 DIAGNOSIS — L89311 Pressure ulcer of right buttock, stage 1: Secondary | ICD-10-CM | POA: Diagnosis not present

## 2019-08-19 DIAGNOSIS — E782 Mixed hyperlipidemia: Secondary | ICD-10-CM | POA: Diagnosis not present

## 2019-08-19 DIAGNOSIS — H409 Unspecified glaucoma: Secondary | ICD-10-CM | POA: Diagnosis not present

## 2019-08-19 DIAGNOSIS — I129 Hypertensive chronic kidney disease with stage 1 through stage 4 chronic kidney disease, or unspecified chronic kidney disease: Secondary | ICD-10-CM | POA: Diagnosis not present

## 2019-08-19 DIAGNOSIS — E039 Hypothyroidism, unspecified: Secondary | ICD-10-CM | POA: Diagnosis not present

## 2019-08-19 DIAGNOSIS — N183 Chronic kidney disease, stage 3 (moderate): Secondary | ICD-10-CM | POA: Diagnosis not present

## 2019-08-19 DIAGNOSIS — E1140 Type 2 diabetes mellitus with diabetic neuropathy, unspecified: Secondary | ICD-10-CM | POA: Diagnosis not present

## 2019-08-19 DIAGNOSIS — N179 Acute kidney failure, unspecified: Secondary | ICD-10-CM | POA: Diagnosis not present

## 2019-08-19 DIAGNOSIS — G8929 Other chronic pain: Secondary | ICD-10-CM | POA: Diagnosis not present

## 2019-08-19 DIAGNOSIS — C3412 Malignant neoplasm of upper lobe, left bronchus or lung: Secondary | ICD-10-CM | POA: Diagnosis not present

## 2019-08-20 DIAGNOSIS — G8929 Other chronic pain: Secondary | ICD-10-CM | POA: Diagnosis not present

## 2019-08-20 DIAGNOSIS — C3412 Malignant neoplasm of upper lobe, left bronchus or lung: Secondary | ICD-10-CM | POA: Diagnosis not present

## 2019-08-20 DIAGNOSIS — H409 Unspecified glaucoma: Secondary | ICD-10-CM | POA: Diagnosis not present

## 2019-08-20 DIAGNOSIS — E46 Unspecified protein-calorie malnutrition: Secondary | ICD-10-CM | POA: Diagnosis not present

## 2019-08-20 DIAGNOSIS — N179 Acute kidney failure, unspecified: Secondary | ICD-10-CM | POA: Diagnosis not present

## 2019-08-20 DIAGNOSIS — E1140 Type 2 diabetes mellitus with diabetic neuropathy, unspecified: Secondary | ICD-10-CM | POA: Diagnosis not present

## 2019-08-20 DIAGNOSIS — N183 Chronic kidney disease, stage 3 (moderate): Secondary | ICD-10-CM | POA: Diagnosis not present

## 2019-08-20 DIAGNOSIS — Z794 Long term (current) use of insulin: Secondary | ICD-10-CM | POA: Diagnosis not present

## 2019-08-20 DIAGNOSIS — I129 Hypertensive chronic kidney disease with stage 1 through stage 4 chronic kidney disease, or unspecified chronic kidney disease: Secondary | ICD-10-CM | POA: Diagnosis not present

## 2019-08-20 DIAGNOSIS — E782 Mixed hyperlipidemia: Secondary | ICD-10-CM | POA: Diagnosis not present

## 2019-08-20 DIAGNOSIS — M25512 Pain in left shoulder: Secondary | ICD-10-CM | POA: Diagnosis not present

## 2019-08-20 DIAGNOSIS — L89321 Pressure ulcer of left buttock, stage 1: Secondary | ICD-10-CM | POA: Diagnosis not present

## 2019-08-20 DIAGNOSIS — I69351 Hemiplegia and hemiparesis following cerebral infarction affecting right dominant side: Secondary | ICD-10-CM | POA: Diagnosis not present

## 2019-08-20 DIAGNOSIS — E1122 Type 2 diabetes mellitus with diabetic chronic kidney disease: Secondary | ICD-10-CM | POA: Diagnosis not present

## 2019-08-20 DIAGNOSIS — B372 Candidiasis of skin and nail: Secondary | ICD-10-CM | POA: Diagnosis not present

## 2019-08-20 DIAGNOSIS — L89311 Pressure ulcer of right buttock, stage 1: Secondary | ICD-10-CM | POA: Diagnosis not present

## 2019-08-20 DIAGNOSIS — Z993 Dependence on wheelchair: Secondary | ICD-10-CM | POA: Diagnosis not present

## 2019-08-20 DIAGNOSIS — E039 Hypothyroidism, unspecified: Secondary | ICD-10-CM | POA: Diagnosis not present

## 2019-08-23 DIAGNOSIS — E1140 Type 2 diabetes mellitus with diabetic neuropathy, unspecified: Secondary | ICD-10-CM | POA: Diagnosis not present

## 2019-08-23 DIAGNOSIS — M25512 Pain in left shoulder: Secondary | ICD-10-CM | POA: Diagnosis not present

## 2019-08-23 DIAGNOSIS — N183 Chronic kidney disease, stage 3 (moderate): Secondary | ICD-10-CM | POA: Diagnosis not present

## 2019-08-23 DIAGNOSIS — N179 Acute kidney failure, unspecified: Secondary | ICD-10-CM | POA: Diagnosis not present

## 2019-08-23 DIAGNOSIS — E782 Mixed hyperlipidemia: Secondary | ICD-10-CM | POA: Diagnosis not present

## 2019-08-23 DIAGNOSIS — I129 Hypertensive chronic kidney disease with stage 1 through stage 4 chronic kidney disease, or unspecified chronic kidney disease: Secondary | ICD-10-CM | POA: Diagnosis not present

## 2019-08-23 DIAGNOSIS — H409 Unspecified glaucoma: Secondary | ICD-10-CM | POA: Diagnosis not present

## 2019-08-23 DIAGNOSIS — L89311 Pressure ulcer of right buttock, stage 1: Secondary | ICD-10-CM | POA: Diagnosis not present

## 2019-08-23 DIAGNOSIS — B372 Candidiasis of skin and nail: Secondary | ICD-10-CM | POA: Diagnosis not present

## 2019-08-23 DIAGNOSIS — Z993 Dependence on wheelchair: Secondary | ICD-10-CM | POA: Diagnosis not present

## 2019-08-23 DIAGNOSIS — C3412 Malignant neoplasm of upper lobe, left bronchus or lung: Secondary | ICD-10-CM | POA: Diagnosis not present

## 2019-08-23 DIAGNOSIS — G8929 Other chronic pain: Secondary | ICD-10-CM | POA: Diagnosis not present

## 2019-08-23 DIAGNOSIS — E039 Hypothyroidism, unspecified: Secondary | ICD-10-CM | POA: Diagnosis not present

## 2019-08-23 DIAGNOSIS — Z794 Long term (current) use of insulin: Secondary | ICD-10-CM | POA: Diagnosis not present

## 2019-08-23 DIAGNOSIS — L89321 Pressure ulcer of left buttock, stage 1: Secondary | ICD-10-CM | POA: Diagnosis not present

## 2019-08-23 DIAGNOSIS — E46 Unspecified protein-calorie malnutrition: Secondary | ICD-10-CM | POA: Diagnosis not present

## 2019-08-23 DIAGNOSIS — I69351 Hemiplegia and hemiparesis following cerebral infarction affecting right dominant side: Secondary | ICD-10-CM | POA: Diagnosis not present

## 2019-08-23 DIAGNOSIS — E1122 Type 2 diabetes mellitus with diabetic chronic kidney disease: Secondary | ICD-10-CM | POA: Diagnosis not present

## 2019-08-24 IMAGING — CT CT CHEST W/O CM
2 of 4 series · 15 of 36 positions shown, 18 images · non-contrast
Comparison: PET 08/30/2018 and CT chest 04/11/2018.

CLINICAL DATA: Non-small cell lung cancer, completed radiation
therapy in [REDACTED].

EXAM:
CT CHEST WITHOUT CONTRAST
TECHNIQUE: Multidetector CT imaging of the chest was performed following the
standard protocol without IV contrast.

[Series 2: thorax · axial · 0.79mm/px · z∈[-332,-28]mm · 12 of 178 slices shown, 15 images]
[im 13/178  mediastinal]
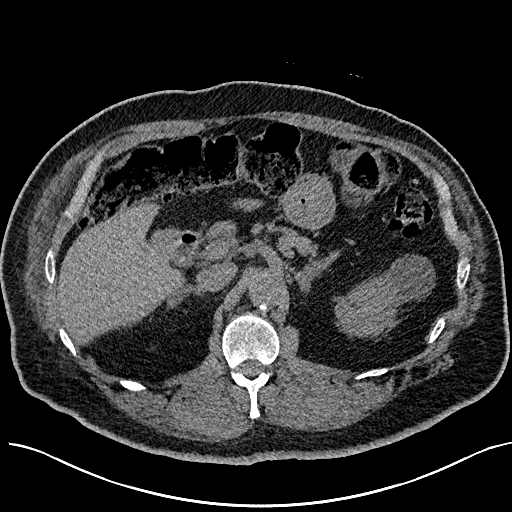
[im 13/178  lung]
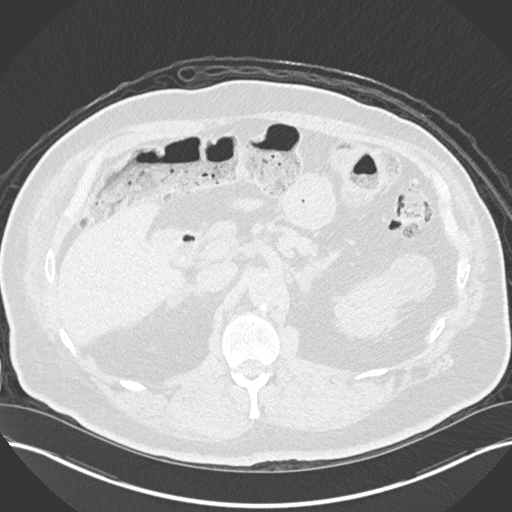
[im 26/178  lung]
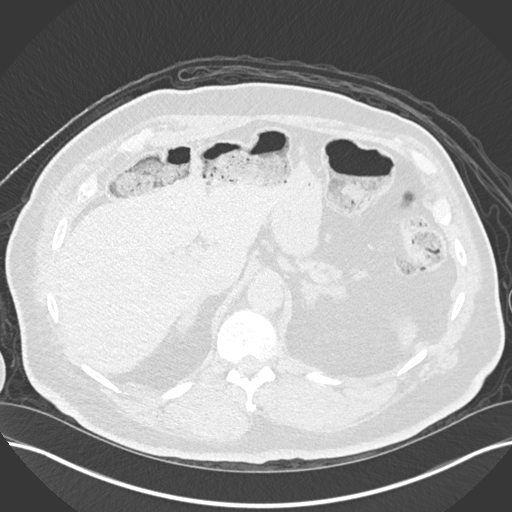
[im 38/178  lung]
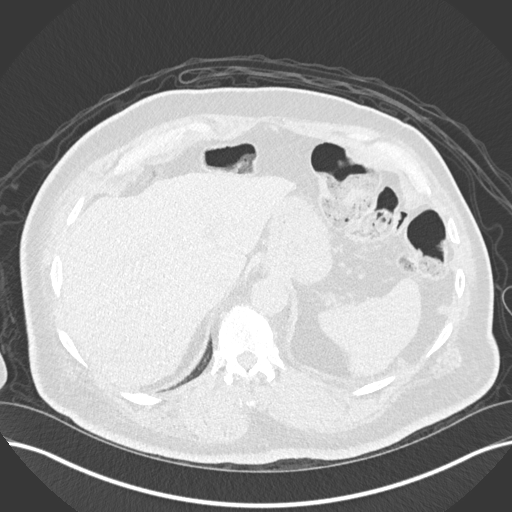
[im 51/178  lung]
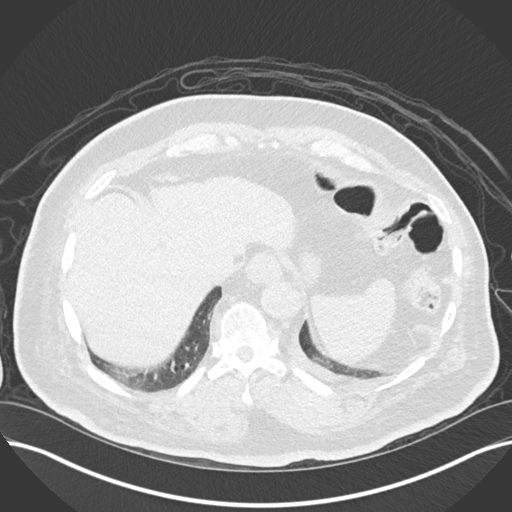
[im 64/178  mediastinal]
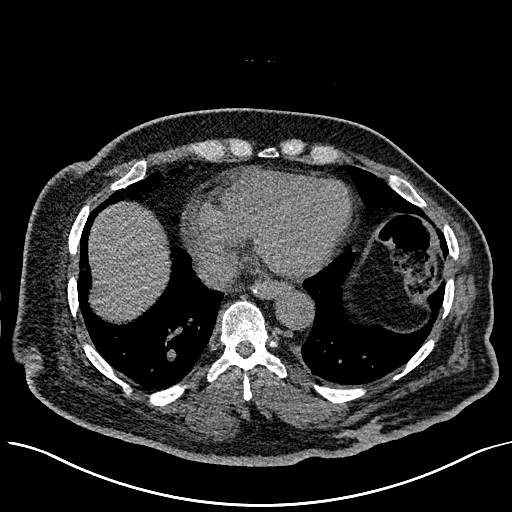
[im 64/178  lung]
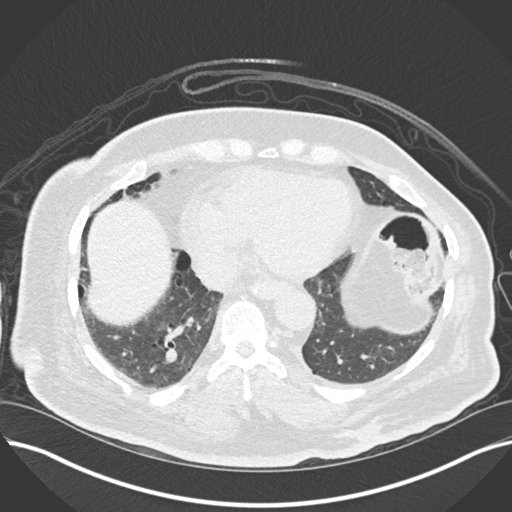
[im 76/178  lung]
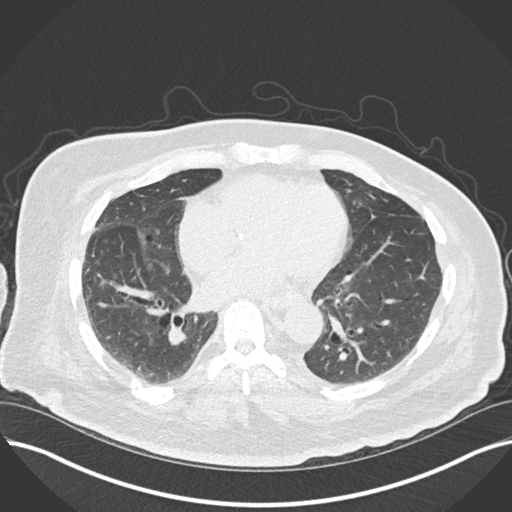
[im 102/178  lung]
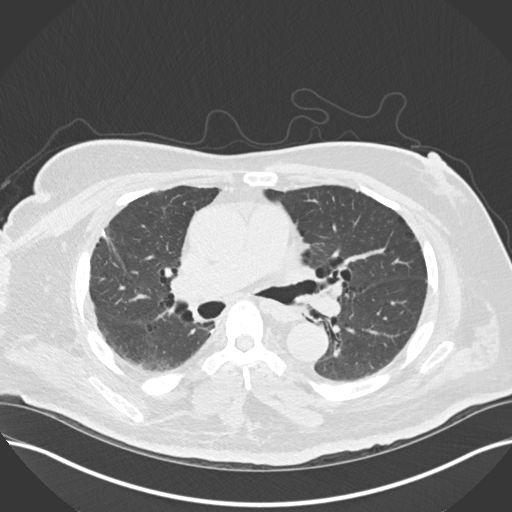
[im 114/178  lung]
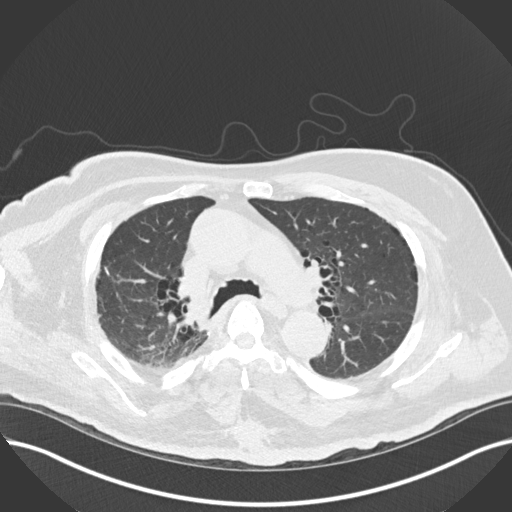
[im 127/178  mediastinal]
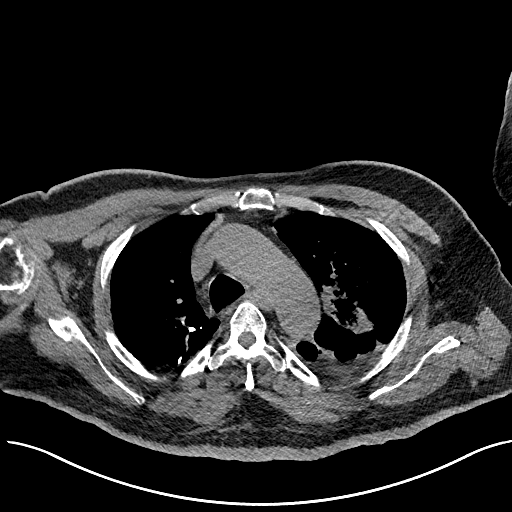
[im 127/178  lung]
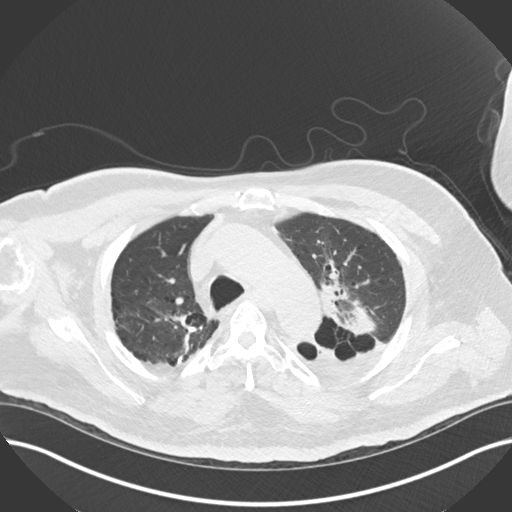
[im 140/178  lung]
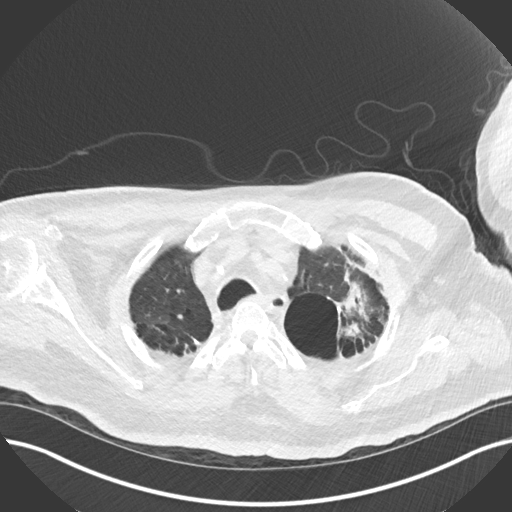
[im 152/178  lung]
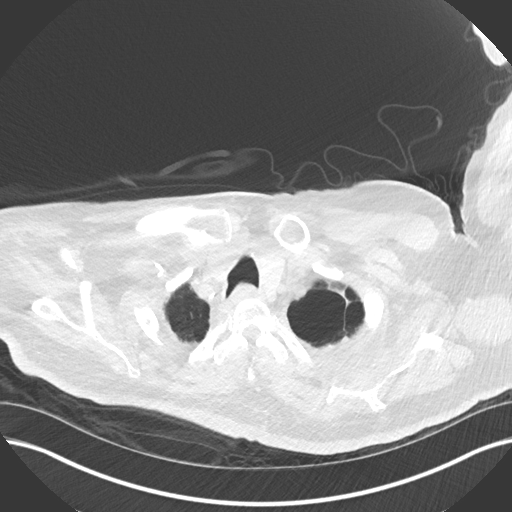
[im 165/178  lung]
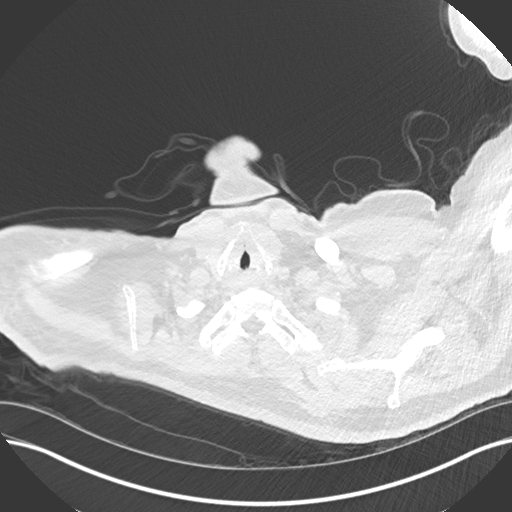

[Series 6: coronal · coronal · 0.70mm/px · 3 of 155 slices shown]
[im 31/155  lung]
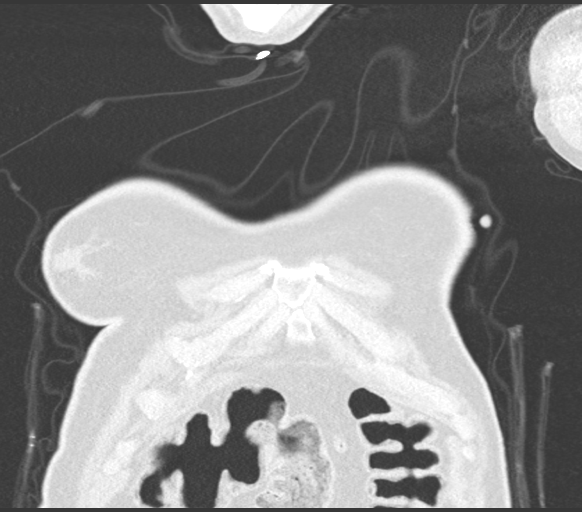
[im 62/155  lung]
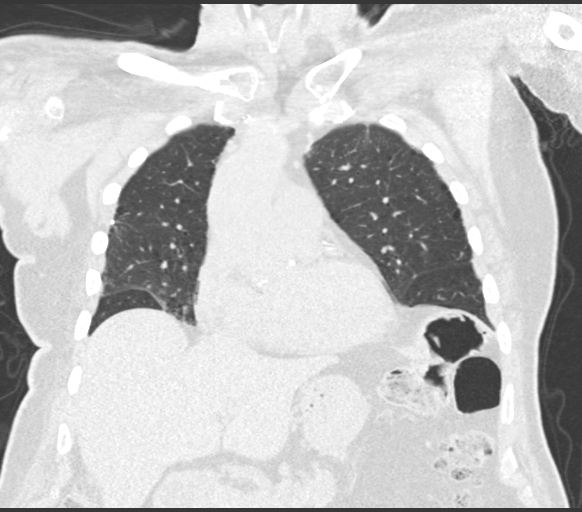
[im 93/155  lung]
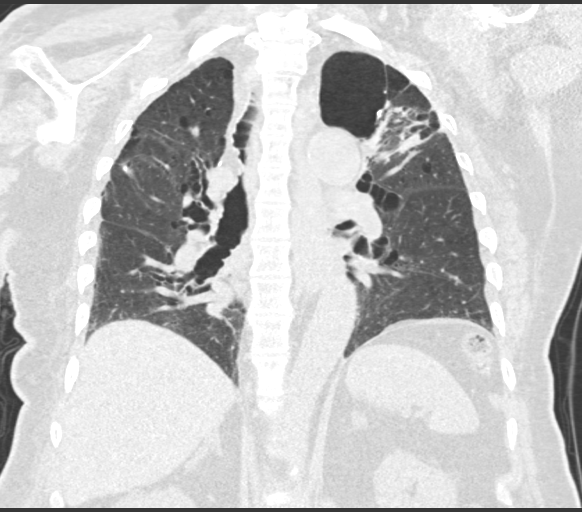

[15 of 36 positions shown; findings below may reference images not displayed]

FINDINGS: Cardiovascular: Atherosclerotic calcification of the aorta, aortic
valve and coronary arteries. Ascending aorta measures 4.3 cm
(coronal series 6, image 63). Heart is at the upper limits of normal
in size. No pericardial effusion.

Mediastinum/Nodes: No pathologically enlarged mediastinal or
axillary lymph nodes. Hilar regions are difficult to evaluate
without IV contrast. Esophagus is grossly unremarkable.

Lungs/Pleura: Bullous emphysema. Postoperative scarring in the right
upper lobe. Patchy consolidation and architectural distortion in the
left upper lobe are similar to the prior exam and indicative of
radiation therapy. Trace loculated left pleural fluid. Postoperative
changes in the lingula. Lungs are otherwise clear. Airway is
otherwise unremarkable.

Upper Abdomen: Visualized portion of the liver is unremarkable.
Slight adrenal thickening, chronic. Visualized portion of the right
kidney is unremarkable. 4.2 cm low-attenuation lesion in the left
kidney is incompletely visualized. Visualized portions of the
spleen, pancreas, stomach and bowel are grossly unremarkable. No
upper abdominal adenopathy.

Musculoskeletal: Degenerative changes in the spine and shoulders.
Thoracotomy changes on the right. Lumbar vertebral body
augmentation. No worrisome lytic or sclerotic lesions. Probable
sebaceous cyst overlies the midthoracic spine.
IMPRESSION: 1. Postoperative and post radiation scarring in the hemi thoraces
bilaterally. No evidence of metastatic disease.
2. Ascending Aortic aneurysm NOS (RMSSP-8M0.L). Recommend annual
imaging followup by CTA or MRA. This recommendation follows 5171
ACCF/AHA/AATS/ACR/ASA/SCA/KWIATOWY/THEVENAU/DEELUN/SOLGLIMT Guidelines for the
Diagnosis and Management of Patients with Thoracic Aortic Disease.
Circulation. 5171; 121: E266-e369. Aortic aneurysm NOS
(RMSSP-8M0.L).
3. Aortic atherosclerosis (RMSSP-170.0). Coronary artery
calcification.
4.  Emphysema (RMSSP-EGS.7).

## 2019-08-26 DIAGNOSIS — I129 Hypertensive chronic kidney disease with stage 1 through stage 4 chronic kidney disease, or unspecified chronic kidney disease: Secondary | ICD-10-CM | POA: Diagnosis not present

## 2019-08-26 DIAGNOSIS — E782 Mixed hyperlipidemia: Secondary | ICD-10-CM | POA: Diagnosis not present

## 2019-08-26 DIAGNOSIS — B372 Candidiasis of skin and nail: Secondary | ICD-10-CM | POA: Diagnosis not present

## 2019-08-26 DIAGNOSIS — N179 Acute kidney failure, unspecified: Secondary | ICD-10-CM | POA: Diagnosis not present

## 2019-08-26 DIAGNOSIS — N183 Chronic kidney disease, stage 3 (moderate): Secondary | ICD-10-CM | POA: Diagnosis not present

## 2019-08-26 DIAGNOSIS — E46 Unspecified protein-calorie malnutrition: Secondary | ICD-10-CM | POA: Diagnosis not present

## 2019-08-26 DIAGNOSIS — E039 Hypothyroidism, unspecified: Secondary | ICD-10-CM | POA: Diagnosis not present

## 2019-08-26 DIAGNOSIS — G8929 Other chronic pain: Secondary | ICD-10-CM | POA: Diagnosis not present

## 2019-08-26 DIAGNOSIS — H409 Unspecified glaucoma: Secondary | ICD-10-CM | POA: Diagnosis not present

## 2019-08-26 DIAGNOSIS — I69351 Hemiplegia and hemiparesis following cerebral infarction affecting right dominant side: Secondary | ICD-10-CM | POA: Diagnosis not present

## 2019-08-26 DIAGNOSIS — E1122 Type 2 diabetes mellitus with diabetic chronic kidney disease: Secondary | ICD-10-CM | POA: Diagnosis not present

## 2019-08-26 DIAGNOSIS — Z794 Long term (current) use of insulin: Secondary | ICD-10-CM | POA: Diagnosis not present

## 2019-08-26 DIAGNOSIS — M25512 Pain in left shoulder: Secondary | ICD-10-CM | POA: Diagnosis not present

## 2019-08-26 DIAGNOSIS — C3412 Malignant neoplasm of upper lobe, left bronchus or lung: Secondary | ICD-10-CM | POA: Diagnosis not present

## 2019-08-26 DIAGNOSIS — E1140 Type 2 diabetes mellitus with diabetic neuropathy, unspecified: Secondary | ICD-10-CM | POA: Diagnosis not present

## 2019-08-26 DIAGNOSIS — Z993 Dependence on wheelchair: Secondary | ICD-10-CM | POA: Diagnosis not present

## 2019-08-26 DIAGNOSIS — L89321 Pressure ulcer of left buttock, stage 1: Secondary | ICD-10-CM | POA: Diagnosis not present

## 2019-08-26 DIAGNOSIS — L89311 Pressure ulcer of right buttock, stage 1: Secondary | ICD-10-CM | POA: Diagnosis not present

## 2019-08-27 DIAGNOSIS — L89311 Pressure ulcer of right buttock, stage 1: Secondary | ICD-10-CM | POA: Diagnosis not present

## 2019-08-27 DIAGNOSIS — B372 Candidiasis of skin and nail: Secondary | ICD-10-CM | POA: Diagnosis not present

## 2019-08-27 DIAGNOSIS — Z993 Dependence on wheelchair: Secondary | ICD-10-CM | POA: Diagnosis not present

## 2019-08-27 DIAGNOSIS — H409 Unspecified glaucoma: Secondary | ICD-10-CM | POA: Diagnosis not present

## 2019-08-27 DIAGNOSIS — E1122 Type 2 diabetes mellitus with diabetic chronic kidney disease: Secondary | ICD-10-CM | POA: Diagnosis not present

## 2019-08-27 DIAGNOSIS — N183 Chronic kidney disease, stage 3 (moderate): Secondary | ICD-10-CM | POA: Diagnosis not present

## 2019-08-27 DIAGNOSIS — E039 Hypothyroidism, unspecified: Secondary | ICD-10-CM | POA: Diagnosis not present

## 2019-08-27 DIAGNOSIS — N179 Acute kidney failure, unspecified: Secondary | ICD-10-CM | POA: Diagnosis not present

## 2019-08-27 DIAGNOSIS — L89321 Pressure ulcer of left buttock, stage 1: Secondary | ICD-10-CM | POA: Diagnosis not present

## 2019-08-27 DIAGNOSIS — E1140 Type 2 diabetes mellitus with diabetic neuropathy, unspecified: Secondary | ICD-10-CM | POA: Diagnosis not present

## 2019-08-27 DIAGNOSIS — I129 Hypertensive chronic kidney disease with stage 1 through stage 4 chronic kidney disease, or unspecified chronic kidney disease: Secondary | ICD-10-CM | POA: Diagnosis not present

## 2019-08-27 DIAGNOSIS — E782 Mixed hyperlipidemia: Secondary | ICD-10-CM | POA: Diagnosis not present

## 2019-08-27 DIAGNOSIS — Z794 Long term (current) use of insulin: Secondary | ICD-10-CM | POA: Diagnosis not present

## 2019-08-27 DIAGNOSIS — C3412 Malignant neoplasm of upper lobe, left bronchus or lung: Secondary | ICD-10-CM | POA: Diagnosis not present

## 2019-08-27 DIAGNOSIS — I69351 Hemiplegia and hemiparesis following cerebral infarction affecting right dominant side: Secondary | ICD-10-CM | POA: Diagnosis not present

## 2019-08-27 DIAGNOSIS — M25512 Pain in left shoulder: Secondary | ICD-10-CM | POA: Diagnosis not present

## 2019-08-27 DIAGNOSIS — E46 Unspecified protein-calorie malnutrition: Secondary | ICD-10-CM | POA: Diagnosis not present

## 2019-08-27 DIAGNOSIS — G8929 Other chronic pain: Secondary | ICD-10-CM | POA: Diagnosis not present

## 2019-08-28 ENCOUNTER — Other Ambulatory Visit: Payer: Self-pay | Admitting: Family Medicine

## 2019-08-28 DIAGNOSIS — N179 Acute kidney failure, unspecified: Secondary | ICD-10-CM | POA: Diagnosis not present

## 2019-08-28 DIAGNOSIS — E1140 Type 2 diabetes mellitus with diabetic neuropathy, unspecified: Secondary | ICD-10-CM | POA: Diagnosis not present

## 2019-08-28 DIAGNOSIS — H409 Unspecified glaucoma: Secondary | ICD-10-CM | POA: Diagnosis not present

## 2019-08-28 DIAGNOSIS — Z794 Long term (current) use of insulin: Secondary | ICD-10-CM | POA: Diagnosis not present

## 2019-08-28 DIAGNOSIS — L89311 Pressure ulcer of right buttock, stage 1: Secondary | ICD-10-CM | POA: Diagnosis not present

## 2019-08-28 DIAGNOSIS — N183 Chronic kidney disease, stage 3 (moderate): Secondary | ICD-10-CM | POA: Diagnosis not present

## 2019-08-28 DIAGNOSIS — I69351 Hemiplegia and hemiparesis following cerebral infarction affecting right dominant side: Secondary | ICD-10-CM | POA: Diagnosis not present

## 2019-08-28 DIAGNOSIS — E1122 Type 2 diabetes mellitus with diabetic chronic kidney disease: Secondary | ICD-10-CM | POA: Diagnosis not present

## 2019-08-28 DIAGNOSIS — Z993 Dependence on wheelchair: Secondary | ICD-10-CM | POA: Diagnosis not present

## 2019-08-28 DIAGNOSIS — E039 Hypothyroidism, unspecified: Secondary | ICD-10-CM | POA: Diagnosis not present

## 2019-08-28 DIAGNOSIS — M25512 Pain in left shoulder: Secondary | ICD-10-CM | POA: Diagnosis not present

## 2019-08-28 DIAGNOSIS — E46 Unspecified protein-calorie malnutrition: Secondary | ICD-10-CM | POA: Diagnosis not present

## 2019-08-28 DIAGNOSIS — E782 Mixed hyperlipidemia: Secondary | ICD-10-CM | POA: Diagnosis not present

## 2019-08-28 DIAGNOSIS — C3412 Malignant neoplasm of upper lobe, left bronchus or lung: Secondary | ICD-10-CM | POA: Diagnosis not present

## 2019-08-28 DIAGNOSIS — B372 Candidiasis of skin and nail: Secondary | ICD-10-CM | POA: Diagnosis not present

## 2019-08-28 DIAGNOSIS — G8929 Other chronic pain: Secondary | ICD-10-CM | POA: Diagnosis not present

## 2019-08-28 DIAGNOSIS — L89321 Pressure ulcer of left buttock, stage 1: Secondary | ICD-10-CM | POA: Diagnosis not present

## 2019-08-28 DIAGNOSIS — I129 Hypertensive chronic kidney disease with stage 1 through stage 4 chronic kidney disease, or unspecified chronic kidney disease: Secondary | ICD-10-CM | POA: Diagnosis not present

## 2019-08-28 NOTE — Telephone Encounter (Signed)
Ok to refill 

## 2019-08-30 ENCOUNTER — Other Ambulatory Visit: Payer: Self-pay

## 2019-08-30 DIAGNOSIS — R6889 Other general symptoms and signs: Secondary | ICD-10-CM | POA: Diagnosis not present

## 2019-08-30 DIAGNOSIS — Z20822 Contact with and (suspected) exposure to covid-19: Secondary | ICD-10-CM

## 2019-08-31 ENCOUNTER — Other Ambulatory Visit: Payer: Self-pay | Admitting: Family Medicine

## 2019-08-31 LAB — NOVEL CORONAVIRUS, NAA: SARS-CoV-2, NAA: NOT DETECTED

## 2019-09-02 DIAGNOSIS — B372 Candidiasis of skin and nail: Secondary | ICD-10-CM | POA: Diagnosis not present

## 2019-09-02 DIAGNOSIS — E1122 Type 2 diabetes mellitus with diabetic chronic kidney disease: Secondary | ICD-10-CM | POA: Diagnosis not present

## 2019-09-02 DIAGNOSIS — L89311 Pressure ulcer of right buttock, stage 1: Secondary | ICD-10-CM | POA: Diagnosis not present

## 2019-09-02 DIAGNOSIS — L89321 Pressure ulcer of left buttock, stage 1: Secondary | ICD-10-CM | POA: Diagnosis not present

## 2019-09-02 DIAGNOSIS — E039 Hypothyroidism, unspecified: Secondary | ICD-10-CM | POA: Diagnosis not present

## 2019-09-02 DIAGNOSIS — E1140 Type 2 diabetes mellitus with diabetic neuropathy, unspecified: Secondary | ICD-10-CM | POA: Diagnosis not present

## 2019-09-02 DIAGNOSIS — Z993 Dependence on wheelchair: Secondary | ICD-10-CM | POA: Diagnosis not present

## 2019-09-02 DIAGNOSIS — E46 Unspecified protein-calorie malnutrition: Secondary | ICD-10-CM | POA: Diagnosis not present

## 2019-09-02 DIAGNOSIS — C3412 Malignant neoplasm of upper lobe, left bronchus or lung: Secondary | ICD-10-CM | POA: Diagnosis not present

## 2019-09-02 DIAGNOSIS — E782 Mixed hyperlipidemia: Secondary | ICD-10-CM | POA: Diagnosis not present

## 2019-09-02 DIAGNOSIS — Z794 Long term (current) use of insulin: Secondary | ICD-10-CM | POA: Diagnosis not present

## 2019-09-02 DIAGNOSIS — I69351 Hemiplegia and hemiparesis following cerebral infarction affecting right dominant side: Secondary | ICD-10-CM | POA: Diagnosis not present

## 2019-09-02 DIAGNOSIS — H409 Unspecified glaucoma: Secondary | ICD-10-CM | POA: Diagnosis not present

## 2019-09-02 DIAGNOSIS — M25512 Pain in left shoulder: Secondary | ICD-10-CM | POA: Diagnosis not present

## 2019-09-02 DIAGNOSIS — N183 Chronic kidney disease, stage 3 (moderate): Secondary | ICD-10-CM | POA: Diagnosis not present

## 2019-09-02 DIAGNOSIS — I129 Hypertensive chronic kidney disease with stage 1 through stage 4 chronic kidney disease, or unspecified chronic kidney disease: Secondary | ICD-10-CM | POA: Diagnosis not present

## 2019-09-02 DIAGNOSIS — N179 Acute kidney failure, unspecified: Secondary | ICD-10-CM | POA: Diagnosis not present

## 2019-09-02 DIAGNOSIS — G8929 Other chronic pain: Secondary | ICD-10-CM | POA: Diagnosis not present

## 2019-09-03 DIAGNOSIS — C3412 Malignant neoplasm of upper lobe, left bronchus or lung: Secondary | ICD-10-CM | POA: Diagnosis not present

## 2019-09-03 DIAGNOSIS — E039 Hypothyroidism, unspecified: Secondary | ICD-10-CM | POA: Diagnosis not present

## 2019-09-03 DIAGNOSIS — N183 Chronic kidney disease, stage 3 (moderate): Secondary | ICD-10-CM | POA: Diagnosis not present

## 2019-09-03 DIAGNOSIS — L89321 Pressure ulcer of left buttock, stage 1: Secondary | ICD-10-CM | POA: Diagnosis not present

## 2019-09-03 DIAGNOSIS — E1140 Type 2 diabetes mellitus with diabetic neuropathy, unspecified: Secondary | ICD-10-CM | POA: Diagnosis not present

## 2019-09-03 DIAGNOSIS — N179 Acute kidney failure, unspecified: Secondary | ICD-10-CM | POA: Diagnosis not present

## 2019-09-03 DIAGNOSIS — B372 Candidiasis of skin and nail: Secondary | ICD-10-CM | POA: Diagnosis not present

## 2019-09-03 DIAGNOSIS — I69351 Hemiplegia and hemiparesis following cerebral infarction affecting right dominant side: Secondary | ICD-10-CM | POA: Diagnosis not present

## 2019-09-03 DIAGNOSIS — E782 Mixed hyperlipidemia: Secondary | ICD-10-CM | POA: Diagnosis not present

## 2019-09-03 DIAGNOSIS — E46 Unspecified protein-calorie malnutrition: Secondary | ICD-10-CM | POA: Diagnosis not present

## 2019-09-03 DIAGNOSIS — H409 Unspecified glaucoma: Secondary | ICD-10-CM | POA: Diagnosis not present

## 2019-09-03 DIAGNOSIS — L89311 Pressure ulcer of right buttock, stage 1: Secondary | ICD-10-CM | POA: Diagnosis not present

## 2019-09-03 DIAGNOSIS — G8929 Other chronic pain: Secondary | ICD-10-CM | POA: Diagnosis not present

## 2019-09-03 DIAGNOSIS — Z993 Dependence on wheelchair: Secondary | ICD-10-CM | POA: Diagnosis not present

## 2019-09-03 DIAGNOSIS — E1122 Type 2 diabetes mellitus with diabetic chronic kidney disease: Secondary | ICD-10-CM | POA: Diagnosis not present

## 2019-09-03 DIAGNOSIS — I129 Hypertensive chronic kidney disease with stage 1 through stage 4 chronic kidney disease, or unspecified chronic kidney disease: Secondary | ICD-10-CM | POA: Diagnosis not present

## 2019-09-03 DIAGNOSIS — Z794 Long term (current) use of insulin: Secondary | ICD-10-CM | POA: Diagnosis not present

## 2019-09-03 DIAGNOSIS — M25512 Pain in left shoulder: Secondary | ICD-10-CM | POA: Diagnosis not present

## 2019-09-05 DIAGNOSIS — I129 Hypertensive chronic kidney disease with stage 1 through stage 4 chronic kidney disease, or unspecified chronic kidney disease: Secondary | ICD-10-CM | POA: Diagnosis not present

## 2019-09-05 DIAGNOSIS — N183 Chronic kidney disease, stage 3 (moderate): Secondary | ICD-10-CM | POA: Diagnosis not present

## 2019-09-05 DIAGNOSIS — N179 Acute kidney failure, unspecified: Secondary | ICD-10-CM | POA: Diagnosis not present

## 2019-09-05 DIAGNOSIS — I69351 Hemiplegia and hemiparesis following cerebral infarction affecting right dominant side: Secondary | ICD-10-CM | POA: Diagnosis not present

## 2019-09-05 DIAGNOSIS — C3412 Malignant neoplasm of upper lobe, left bronchus or lung: Secondary | ICD-10-CM | POA: Diagnosis not present

## 2019-09-05 DIAGNOSIS — L89321 Pressure ulcer of left buttock, stage 1: Secondary | ICD-10-CM | POA: Diagnosis not present

## 2019-09-05 DIAGNOSIS — E1140 Type 2 diabetes mellitus with diabetic neuropathy, unspecified: Secondary | ICD-10-CM | POA: Diagnosis not present

## 2019-09-05 DIAGNOSIS — E1122 Type 2 diabetes mellitus with diabetic chronic kidney disease: Secondary | ICD-10-CM | POA: Diagnosis not present

## 2019-09-05 DIAGNOSIS — E46 Unspecified protein-calorie malnutrition: Secondary | ICD-10-CM | POA: Diagnosis not present

## 2019-09-05 DIAGNOSIS — L89311 Pressure ulcer of right buttock, stage 1: Secondary | ICD-10-CM | POA: Diagnosis not present

## 2019-09-05 DIAGNOSIS — Z794 Long term (current) use of insulin: Secondary | ICD-10-CM | POA: Diagnosis not present

## 2019-09-05 DIAGNOSIS — E039 Hypothyroidism, unspecified: Secondary | ICD-10-CM | POA: Diagnosis not present

## 2019-09-05 DIAGNOSIS — E782 Mixed hyperlipidemia: Secondary | ICD-10-CM | POA: Diagnosis not present

## 2019-09-05 DIAGNOSIS — B372 Candidiasis of skin and nail: Secondary | ICD-10-CM | POA: Diagnosis not present

## 2019-09-05 DIAGNOSIS — Z993 Dependence on wheelchair: Secondary | ICD-10-CM | POA: Diagnosis not present

## 2019-09-05 DIAGNOSIS — G8929 Other chronic pain: Secondary | ICD-10-CM | POA: Diagnosis not present

## 2019-09-05 DIAGNOSIS — M25512 Pain in left shoulder: Secondary | ICD-10-CM | POA: Diagnosis not present

## 2019-09-05 DIAGNOSIS — H409 Unspecified glaucoma: Secondary | ICD-10-CM | POA: Diagnosis not present

## 2019-09-06 ENCOUNTER — Emergency Department (HOSPITAL_COMMUNITY)
Admission: EM | Admit: 2019-09-06 | Discharge: 2019-09-06 | Disposition: A | Discharge status: Home / Self Care | Payer: Medicare Other | Attending: Emergency Medicine | Admitting: Emergency Medicine

## 2019-09-06 ENCOUNTER — Encounter (HOSPITAL_COMMUNITY): Payer: Self-pay | Admitting: Emergency Medicine

## 2019-09-06 ENCOUNTER — Other Ambulatory Visit: Payer: Self-pay

## 2019-09-06 ENCOUNTER — Emergency Department (HOSPITAL_COMMUNITY): Payer: Medicare Other

## 2019-09-06 DIAGNOSIS — Y999 Unspecified external cause status: Secondary | ICD-10-CM | POA: Diagnosis not present

## 2019-09-06 DIAGNOSIS — J45909 Unspecified asthma, uncomplicated: Secondary | ICD-10-CM | POA: Diagnosis not present

## 2019-09-06 DIAGNOSIS — E1122 Type 2 diabetes mellitus with diabetic chronic kidney disease: Secondary | ICD-10-CM | POA: Diagnosis not present

## 2019-09-06 DIAGNOSIS — W19XXXA Unspecified fall, initial encounter: Secondary | ICD-10-CM | POA: Diagnosis not present

## 2019-09-06 DIAGNOSIS — Z87891 Personal history of nicotine dependence: Secondary | ICD-10-CM | POA: Diagnosis not present

## 2019-09-06 DIAGNOSIS — Z23 Encounter for immunization: Secondary | ICD-10-CM

## 2019-09-06 DIAGNOSIS — W050XXA Fall from non-moving wheelchair, initial encounter: Secondary | ICD-10-CM | POA: Diagnosis not present

## 2019-09-06 DIAGNOSIS — Z794 Long term (current) use of insulin: Secondary | ICD-10-CM | POA: Insufficient documentation

## 2019-09-06 DIAGNOSIS — Z79899 Other long term (current) drug therapy: Secondary | ICD-10-CM | POA: Diagnosis not present

## 2019-09-06 DIAGNOSIS — R600 Localized edema: Secondary | ICD-10-CM | POA: Diagnosis not present

## 2019-09-06 DIAGNOSIS — S96912A Strain of unspecified muscle and tendon at ankle and foot level, left foot, initial encounter: Secondary | ICD-10-CM | POA: Diagnosis not present

## 2019-09-06 DIAGNOSIS — Y929 Unspecified place or not applicable: Secondary | ICD-10-CM | POA: Diagnosis not present

## 2019-09-06 DIAGNOSIS — S99912A Unspecified injury of left ankle, initial encounter: Secondary | ICD-10-CM | POA: Diagnosis present

## 2019-09-06 DIAGNOSIS — I129 Hypertensive chronic kidney disease with stage 1 through stage 4 chronic kidney disease, or unspecified chronic kidney disease: Secondary | ICD-10-CM | POA: Insufficient documentation

## 2019-09-06 DIAGNOSIS — Y9389 Activity, other specified: Secondary | ICD-10-CM | POA: Diagnosis not present

## 2019-09-06 DIAGNOSIS — R52 Pain, unspecified: Secondary | ICD-10-CM | POA: Diagnosis not present

## 2019-09-06 DIAGNOSIS — N183 Chronic kidney disease, stage 3 (moderate): Secondary | ICD-10-CM | POA: Diagnosis not present

## 2019-09-06 MED ORDER — ACETAMINOPHEN 500 MG PO TABS
1000.0000 mg | ORAL_TABLET | Freq: Once | ORAL | Status: AC
  Administered 2019-09-06: 12:00:00 1000 mg via ORAL
  Filled 2019-09-06: qty 2

## 2019-09-06 MED ORDER — INFLUENZA VAC A&B SA ADJ QUAD 0.5 ML IM PRSY
0.5000 mL | PREFILLED_SYRINGE | INTRAMUSCULAR | Status: AC
  Administered 2019-09-06: 0.5 mL via INTRAMUSCULAR
  Filled 2019-09-06: qty 0.5

## 2019-09-06 NOTE — Discharge Instructions (Signed)
Use ice and Tylenol as needed for pain. Follow-up if no improvement or worsening symptoms next week.

## 2019-09-06 NOTE — ED Provider Notes (Addendum)
[facility] EMERGENCY DEPARTMENT Provider Note   CSN: [id number] Arrival date & time: 09/06/19  9935     History   Chief Complaint Chief Complaint  Patient presents with  . Fall    HPI [name] is a 73 y.o. male.     Patient with history of stroke, lymphoma, asthma presents with left leg and ankle pain.  Patient was trying to get out of the wheelchair and tripped causing pain.  No other significant injuries.  No syncope, no chest pain, no shortness of breath.  Pain with palpation.     Past Medical History:  Diagnosis Date  . Arthritis   . Asthma    "hx of"  . Barrett's esophagus    EGD 03/23/2011 & EGD 2/09 bx proven  . BPH (benign prostatic hyperplasia)   . Cancer of parotid gland ([place]) 11/23/12   Adenocarcinoma  . Chronic abdominal pain   . Chronic constipation   . Colon polyp 03/23/2011   tubular adenoma, Dr. Gala Romney  . 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 ([place]) 07/15/2011  . CVA (cerebral infarction) 1998   right sided deficit  . Delayed gastric emptying 2018  . Diverticulosis    TCS 03/23/11 pancolonic diverticula &TCS 5/08, pancolonic diverticula  . DM (diabetes mellitus) ([place])   . Edema of lower extremity 12/21/12   bilateral   . Esotropia of left eye   . GERD (gastroesophageal reflux disease)   . Glaucoma (increased eye pressure)   . Gout   . Gout   . Hemorrhagic colitis 06/06/2012.  [name] Hemorrhoids, internal 03/23/2011   tcs by Dr. Gala Romney  . Hepatitis    esosiniphilic, tx with prednisone  . Hiatal hernia   . History of radiation therapy 05/21/18- 05/30/18   Left Lung/ 54 Gy delivered in 3 fractions of 18 Gy. SBRT  . HTN (hypertension)   . Hx of radiation therapy 1974   right base of skull area-lymphoma  . Hyperlipidemia   . Lower facial weakness    Right  . Lymphoma ([place]) 1974   XRT at [facility], right base of skull area  . Neuropathy   . Non-small cell lung cancer ([place]) dx'd 04/26/18  . Peripheral edema    R>L legs  . Rash    chronic,  recurrent, R>L legs  . Renal insufficiency   . Steatohepatitis    liver biopsy 2009  . Stroke [facility]) 1998   right hemiparesis/plegia    Patient Active Problem List   Diagnosis Date Noted  . Protein-calorie malnutrition ([place]) 06/10/2019  . Vitamin D deficiency 03/07/2019  . Hypogonadism, male 07/26/2018  . Gynecomastia, male 06/27/2018  . Malignant neoplasm of upper lobe of left lung ([place]) 04/06/2018  . History of parotid cancer 02/07/2018  . RUQ pain 11/09/2016  . Colon adenomas 11/09/2016  . Primary hypothyroidism 09/09/2015  . Chronic constipation 05/22/2015  . Candidiasis, intertrigo 09/05/2014  . Arthritis, shoulder region 12/10/2013  . Impetigo bullosa 10/23/2013  . Intertrigo 10/12/2013  . Chronic left shoulder pain 09/25/2013  . Muscle weakness (generalized) 09/25/2013  . Hypercalcemia 08/08/2013  . CKD (chronic kidney disease), stage III ([place]) 08/07/2013  . Adhesive capsulitis of left shoulder 05/14/2013  . Steatohepatitis   . Diverticulosis   . GERD (gastroesophageal reflux disease)   . History of CVA (cerebrovascular accident)   . Hiatal hernia   . Mixed hyperlipidemia   . Glaucoma (increased eye pressure)   . Esotropia of left eye   . BPH (benign  prostatic hyperplasia)   . Hx of radiation therapy   . 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 Epidermal inclusion cyst 10/05/2012  . Neck pain 08/26/2012  . Chronic abdominal pain 08/26/2012  . Leg edema 08/24/2012  . Hemiplegia of dominant side as late effect following cerebrovascular disease ([place]) 06/06/2012  . Diarrhea 04/17/2012  . Obesity 04/17/2012  . Diabetic neuropathy ([place]) 02/13/2012  . Recurrent boils 01/12/2012  . 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 ([place]) 07/15/2011  . Hemorrhoids, internal 03/23/2011  . Hepatitis 03/02/2011  . EOSINOPHILIA 12/23/2009  . Type 2 diabetes mellitus with stage 2 chronic kidney disease, with long-term current use of insulin ([place]) 01/05/2009  .  Gout, unspecified 01/05/2009  . Essential hypertension 01/05/2009  . BARRETTS ESOPHAGUS 01/05/2009  . DIVERTICULOSIS OF COLON 01/05/2009  . History of lymphoma 01/05/2009    Past Surgical History:  Procedure Laterality Date  . BIOPSY  12/01/2016   Procedure: BIOPSY;  Surgeon: Daneil Dolin, MD;  Location: [facility] ENDO SUITE;  Service: Endoscopy;;  duodenum, gastric, esophagus  . CHOLECYSTECTOMY    . COLONOSCOPY  03/23/11   Dr. Gala Romney  pancolonic diverticula, hemorrhoids, tubular adenoma.. next tcs 03/2016  . COLONOSCOPY WITH PROPOFOL N/A 12/01/2016   inadequate bowel prep precluded exam  . ESOPHAGOGASTRODUODENOSCOPY  02/05/08   goblet cell metaplasia/negative for H.pylori  . ESOPHAGOGASTRODUODENOSCOPY  03/23/11   Dr. Gala Romney, barretts, hiatal hernia  . ESOPHAGOGASTRODUODENOSCOPY (EGD) WITH PROPOFOL N/A 12/01/2016   Dr. Gala Romney: Large amount of retained gastric contents precluded completion of the stomach. Mucosal changes were found in the stomach. Erosions and somewhat scalloped appearing mucosa present, reactive gastritis/no H pylori. Barrett's esophagus noted, no dysplasia on biopsy. Duodenal biopsies taken as well, benign, no evidence of eosinophilia.  . IR FLUORO GUIDED NEEDLE PLC ASPIRATION/INJECTION LOC  12/13/2018  . IR KYPHO LUMBAR INC FX REDUCE BONE BX UNI/BIL CANNULATION INC/IMAGING  12/13/2018  . IR RADIOLOGY PERIPHERAL GUIDED IV START  12/13/2018  . IR US GUIDE VASC ACCESS LEFT  12/13/2018  . MASS BIOPSY  11/01/2012   Procedure: NECK MASS BIOPSY;  Surgeon: Ascencion Dike, MD;  Location: [facility] ORS;  Service: ENT;  Laterality: Right;  Excisional Bx Right Neck Mass; attempted external jugular cutdown of left side  . PAROTIDECTOMY  11/24/2012   Procedure: PAROTIDECTOMY;  Surgeon: Ascencion Dike, MD;  Location: [place];  Service: ENT;  Laterality: N/A;  Total parotidectomy  . PLEURECTOMY    . right lymph node removal Right    behind right ear  . Right video-assisted thoracic surgery, pleurectomy, and  pleurodesis  2011  . VIDEO BRONCHOSCOPY WITH ENDOBRONCHIAL NAVIGATION N/A 04/26/2018   Procedure: VIDEO BRONCHOSCOPY WITH ENDOBRONCHIAL NAVIGATION;  Surgeon: Melrose Nakayama, MD;  Location: [place];  Service: Thoracic;  Laterality: N/A;        Home Medications    Prior to Admission medications   Medication Sig Start Date End Date Taking? Authorizing Provider  allopurinol (ZYLOPRIM) 300 MG tablet TAKE 1 TABLET BY MOUTH EVERY DAY Patient taking differently: Take 300 mg by mouth every morning.  06/24/19   [place], [name], MD  AMITIZA 24 MCG capsule TAKE 1 CAPSULE(24 MCG) BY MOUTH TWICE DAILY WITH A MEAL Patient taking differently: Take 24 mcg by mouth every morning.  02/22/19   Carlis Stable, NP  amLODipine (NORVASC) 10 MG tablet Take 1 tablet (10 mg total) by mouth daily. Patient taking differently: Take 10 mg by mouth every morning.  03/18/19  [place], [name], MD  Cholecalciferol (VITAMIN D3) 125 MCG (5000 UT) CAPS Take 1 capsule (5,000 Units total) by mouth daily. Patient taking differently: Take 5,000 Units by mouth every morning.  03/08/19   [name], MD  cloNIDine (CATAPRES) 0.2 MG tablet TAKE 1 TABLET BY MOUTH THREE TIMES DAILY 08/20/19   [name], MD  furosemide (LASIX) 40 MG tablet TAKE 1 TABLET BY MOUTH EVERY DAY 09/02/19   [place], [name], MD  gabapentin (NEURONTIN) 300 MG capsule TAKE 2 CAPSULES BY MOUTH TWICE DAILY(MAY TAKE AN ADDITIONAL 2 CAPSULES MIDDAY AS NEEDED FOR PAIN) Patient taking differently: Take 600 mg by mouth 2 (two) times daily. TAKE 2 CAPSULES BY MOUTH TWICE DAILY(MAY TAKE AN ADDITIONAL 2 CAPSULES MIDDAY AS NEEDED FOR PAIN) 04/22/19   [name], MD  Insulin Glargine (LANTUS SOLOSTAR) 100 UNIT/ML Solostar Pen Inject 38 Units into the skin at bedtime. 06/11/19   Cassandria Anger, MD  lisinopril (ZESTRIL) 20 MG tablet TAKE 1 TABLET BY MOUTH DAILY Patient taking differently: Take 20 mg by mouth every morning.  06/24/19   [name],  MD  metFORMIN (GLUCOPHAGE) 500 MG tablet TAKE 1 TABLET BY MOUTH TWICE DAILY 08/05/19   Cassandria Anger, MD  metoprolol succinate (TOPROL-XL) 50 MG 24 hr tablet TAKE 1 TABLET BY MOUTH EVERY DAY WITH OR IMMEDIATELY FOLLOWING A MEAL 09/02/19   [place], [name], MD  nystatin (MYCOSTATIN/NYSTOP) powder Apply topically 3 (three) times daily. 07/24/19   [place], [name], MD  oxyCODONE-acetaminophen (PERCOCET) 7.5-325 MG tablet Take 1 tablet by mouth every 4 (four) hours as needed. 08/05/19   [name], MD  pantoprazole (PROTONIX) 40 MG tablet TAKE 1 TABLET BY MOUTH DAILY 07/17/19   [place], [name], MD  potassium chloride SA (K-DUR) 20 MEQ tablet TAKE 1 TABLET(20 MEQ) BY MOUTH DAILY Patient taking differently: Take 20 mEq by mouth daily.  06/24/19   [name], MD  simvastatin (ZOCOR) 20 MG tablet TAKE 1 TABLET BY MOUTH EVERY NIGHT AT BEDTIME 08/20/19   [place], [name], MD  tamsulosin (FLOMAX) 0.4 MG CAPS capsule TAKE 1 CAPSULE BY MOUTH DAILY Patient taking differently: Take 0.4 mg by mouth every morning.  07/09/19   [name], MD  tiZANidine (ZANAFLEX) 2 MG tablet TAKE 1 TABLET(2 MG) BY MOUTH TWICE DAILY AS NEEDED 08/28/19   [place], [name], MD  Triamcinolone Acetonide (TRIAMCINOLONE 0.1 % CREAM : EUCERIN) CREA Apply 1 application topically 2 (two) times daily as needed. Patient taking differently: Apply 1 application topically 2 (two) times daily as needed for rash.  02/06/18   [name], MD    Family History Family History  Problem Relation Age of Onset  . Heart failure Mother   . Heart failure Father   . Heart failure Sister   . Heart failure Son     Social History Social History   Tobacco Use  . Smoking status: Former Smoker    Packs/day: 3.00    Years: 25.00    Pack years: 75.00    Types: Cigarettes    Quit date: 03/01/1997    Years since quitting: 22.5  . Smokeless tobacco: Never Used  Substance Use Topics  . Alcohol use: No  . Drug use: No      Allergies   Patient has no known allergies.   Review of Systems Review of Systems  Constitutional: Negative for fever.  HENT: Negative for congestion.   Respiratory: Negative for shortness  of breath.   Cardiovascular: Negative for chest pain.  Gastrointestinal: Negative for abdominal pain and vomiting.  Genitourinary: Negative for flank pain.  Musculoskeletal: Positive for gait problem and joint swelling. Negative for back pain, neck pain and neck stiffness.  Skin: Negative for rash.  Neurological: Negative for light-headedness and headaches.     Physical Exam Updated Vital Signs BP (!) 127/105 (BP Location: Right Wrist)   Pulse 71   Temp 97.6 F (36.4 C) (Oral)   Resp 16   Wt 113.4 kg   SpO2 100%   BMI 32.10 kg/m   Physical Exam Vitals signs and nursing note reviewed.  Constitutional:      Appearance: He is well-developed.  HENT:     Head: Normocephalic and atraumatic.  Eyes:     General:        Right eye: No discharge.        Left eye: No discharge.     Conjunctiva/sclera: Conjunctivae normal.  Neck:     Musculoskeletal: Normal range of motion and neck supple.     Trachea: No tracheal deviation.  Cardiovascular:     Rate and Rhythm: Normal rate.  Pulmonary:     Effort: Pulmonary effort is normal.  Abdominal:     General: There is no distension.     Palpations: Abdomen is soft.     Tenderness: There is no guarding.  Musculoskeletal:        General: Swelling and tenderness present. No deformity.     Comments: Patient has tenderness mid tibia, distal tibia and lateral ankle with mild left ankle joint effusion.  Pain with moving ankle flexion extension.  Patient denies left knee tenderness, flexion extension of knee no tenderness.  No tenderness to left hip.  Skin:    General: Skin is warm.     Findings: No rash.  Neurological:     Mental Status: He is alert and oriented to person, place, and time.      ED Treatments / Results  Labs (all labs ordered  are listed, but only abnormal results are displayed) Labs Reviewed - No data to display  EKG None  Radiology Dg Tibia/fibula Left  Result Date: 09/06/2019 CLINICAL DATA:  Fall, pain EXAM: LEFT TIBIA AND FIBULA - 2 VIEW; LEFT FOOT - 2 VIEW; LEFT ANKLE COMPLETE - 3+ VIEW COMPARISON:  Left tibia and fibula, 07/05/2011, left foot and ankle, 08/29/2005 FINDINGS: No fracture or dislocation of the left tibia or fibula. No fracture or dislocation of the left ankle. There is mild ankle mortise arthrosis with a probable nonacute osteochondral lesion of the medial talar dome. There is extensive soft tissue edema about the ankle. No fracture or dislocation of the left foot. There is extensive erosive arthropathy about the first metatarsophalangeal joint and midfoot, most consistent with gouty arthropathy. There is diffuse soft tissue edema about the foot. IMPRESSION: 1.  No fracture or dislocation of the left tibia or fibula. 2. No fracture or dislocation of the left ankle. There is mild ankle mortise arthrosis with a probable nonacute osteochondral lesion of the medial talar dome, similar in appearance compared to prior examination dated 08/30/2015 with slightly worsened fibrosis. There is extensive soft tissue edema about the ankle. 3. No fracture or dislocation of the left foot. There is extensive erosive arthropathy about the first metatarsophalangeal joint and midfoot, most consistent with gouty arthropathy and generally in keeping with appearance on examination dated 08/29/2005. There is diffuse soft tissue edema about the foot. Electronically Signed  By: Eddie Candle M.D.   On: 09/06/2019 12:27   Dg Ankle Complete Left  Result Date: 09/06/2019 CLINICAL DATA:  Fall, pain EXAM: LEFT TIBIA AND FIBULA - 2 VIEW; LEFT FOOT - 2 VIEW; LEFT ANKLE COMPLETE - 3+ VIEW COMPARISON:  Left tibia and fibula, 07/05/2011, left foot and ankle, 08/29/2005 FINDINGS: No fracture or dislocation of the left tibia or fibula. No  fracture or dislocation of the left ankle. There is mild ankle mortise arthrosis with a probable nonacute osteochondral lesion of the medial talar dome. There is extensive soft tissue edema about the ankle. No fracture or dislocation of the left foot. There is extensive erosive arthropathy about the first metatarsophalangeal joint and midfoot, most consistent with gouty arthropathy. There is diffuse soft tissue edema about the foot. IMPRESSION: 1.  No fracture or dislocation of the left tibia or fibula. 2. No fracture or dislocation of the left ankle. There is mild ankle mortise arthrosis with a probable nonacute osteochondral lesion of the medial talar dome, similar in appearance compared to prior examination dated 08/30/2015 with slightly worsened fibrosis. There is extensive soft tissue edema about the ankle. 3. No fracture or dislocation of the left foot. There is extensive erosive arthropathy about the first metatarsophalangeal joint and midfoot, most consistent with gouty arthropathy and generally in keeping with appearance on examination dated 08/29/2005. There is diffuse soft tissue edema about the foot. Electronically Signed   By: Eddie Candle M.D.   On: 09/06/2019 12:27   Dg Foot 2 Views Left  Result Date: 09/06/2019 CLINICAL DATA:  Fall, pain EXAM: LEFT TIBIA AND FIBULA - 2 VIEW; LEFT FOOT - 2 VIEW; LEFT ANKLE COMPLETE - 3+ VIEW COMPARISON:  Left tibia and fibula, 07/05/2011, left foot and ankle, 08/29/2005 FINDINGS: No fracture or dislocation of the left tibia or fibula. No fracture or dislocation of the left ankle. There is mild ankle mortise arthrosis with a probable nonacute osteochondral lesion of the medial talar dome. There is extensive soft tissue edema about the ankle. No fracture or dislocation of the left foot. There is extensive erosive arthropathy about the first metatarsophalangeal joint and midfoot, most consistent with gouty arthropathy. There is diffuse soft tissue edema about the  foot. IMPRESSION: 1.  No fracture or dislocation of the left tibia or fibula. 2. No fracture or dislocation of the left ankle. There is mild ankle mortise arthrosis with a probable nonacute osteochondral lesion of the medial talar dome, similar in appearance compared to prior examination dated 08/30/2015 with slightly worsened fibrosis. There is extensive soft tissue edema about the ankle. 3. No fracture or dislocation of the left foot. There is extensive erosive arthropathy about the first metatarsophalangeal joint and midfoot, most consistent with gouty arthropathy and generally in keeping with appearance on examination dated 08/29/2005. There is diffuse soft tissue edema about the foot. Electronically Signed   By: Eddie Candle M.D.   On: 09/06/2019 12:27    Procedures Procedures (including critical care time)  Medications Ordered in ED Medications  influenza vaccine adjuvanted (FLUAD) injection 0.5 mL (has no administration in time range)  acetaminophen (TYLENOL) tablet 1,000 mg (1,000 mg Oral Given 09/06/19 1209)     Initial Impression / Assessment and Plan / ED Course  I have reviewed the triage vital signs and the nursing notes.  Pertinent labs & imaging results that were available during my care of the patient were reviewed by me and considered in my medical decision making (see chart for details).  Patient presents with left leg and ankle injury.  X-rays pending, pain meds and ice ordered.  X-rays no acute fractures.  Vital signs overall unremarkable mild elevated blood pressure.  Patient stable for outpatient follow-up with supportive care Pt has difficulty getting to appointments, flu shot ordered.  Final Clinical Impressions(s) / ED Diagnoses   Final diagnoses:  Left ankle strain, initial encounter  Fall, initial encounter  Influenza vaccine administered    ED Discharge Orders    None       Elnora Morrison, MD 09/06/19 1235    Elnora Morrison, MD 09/06/19 1402

## 2019-09-06 NOTE — ED Notes (Signed)
ED Provider at bedside. 

## 2019-09-06 NOTE — ED Triage Notes (Signed)
Pt from home with wife.  Pt was attempting to get into his wheelchair, tripped falling on his left knee. Pain to left knee with movement.

## 2019-09-10 DIAGNOSIS — E46 Unspecified protein-calorie malnutrition: Secondary | ICD-10-CM | POA: Diagnosis not present

## 2019-09-10 DIAGNOSIS — N179 Acute kidney failure, unspecified: Secondary | ICD-10-CM | POA: Diagnosis not present

## 2019-09-10 DIAGNOSIS — Z794 Long term (current) use of insulin: Secondary | ICD-10-CM | POA: Diagnosis not present

## 2019-09-10 DIAGNOSIS — B372 Candidiasis of skin and nail: Secondary | ICD-10-CM | POA: Diagnosis not present

## 2019-09-10 DIAGNOSIS — I69351 Hemiplegia and hemiparesis following cerebral infarction affecting right dominant side: Secondary | ICD-10-CM | POA: Diagnosis not present

## 2019-09-10 DIAGNOSIS — L89311 Pressure ulcer of right buttock, stage 1: Secondary | ICD-10-CM | POA: Diagnosis not present

## 2019-09-10 DIAGNOSIS — I129 Hypertensive chronic kidney disease with stage 1 through stage 4 chronic kidney disease, or unspecified chronic kidney disease: Secondary | ICD-10-CM | POA: Diagnosis not present

## 2019-09-10 DIAGNOSIS — C3412 Malignant neoplasm of upper lobe, left bronchus or lung: Secondary | ICD-10-CM | POA: Diagnosis not present

## 2019-09-10 DIAGNOSIS — E039 Hypothyroidism, unspecified: Secondary | ICD-10-CM | POA: Diagnosis not present

## 2019-09-10 DIAGNOSIS — L89321 Pressure ulcer of left buttock, stage 1: Secondary | ICD-10-CM | POA: Diagnosis not present

## 2019-09-10 DIAGNOSIS — E1122 Type 2 diabetes mellitus with diabetic chronic kidney disease: Secondary | ICD-10-CM | POA: Diagnosis not present

## 2019-09-10 DIAGNOSIS — M25512 Pain in left shoulder: Secondary | ICD-10-CM | POA: Diagnosis not present

## 2019-09-10 DIAGNOSIS — N183 Chronic kidney disease, stage 3 (moderate): Secondary | ICD-10-CM | POA: Diagnosis not present

## 2019-09-10 DIAGNOSIS — G8929 Other chronic pain: Secondary | ICD-10-CM | POA: Diagnosis not present

## 2019-09-10 DIAGNOSIS — H409 Unspecified glaucoma: Secondary | ICD-10-CM | POA: Diagnosis not present

## 2019-09-10 DIAGNOSIS — E1140 Type 2 diabetes mellitus with diabetic neuropathy, unspecified: Secondary | ICD-10-CM | POA: Diagnosis not present

## 2019-09-10 DIAGNOSIS — Z993 Dependence on wheelchair: Secondary | ICD-10-CM | POA: Diagnosis not present

## 2019-09-10 DIAGNOSIS — E782 Mixed hyperlipidemia: Secondary | ICD-10-CM | POA: Diagnosis not present

## 2019-09-11 DIAGNOSIS — C3412 Malignant neoplasm of upper lobe, left bronchus or lung: Secondary | ICD-10-CM | POA: Diagnosis not present

## 2019-09-11 DIAGNOSIS — E782 Mixed hyperlipidemia: Secondary | ICD-10-CM | POA: Diagnosis not present

## 2019-09-11 DIAGNOSIS — G8929 Other chronic pain: Secondary | ICD-10-CM | POA: Diagnosis not present

## 2019-09-11 DIAGNOSIS — H409 Unspecified glaucoma: Secondary | ICD-10-CM | POA: Diagnosis not present

## 2019-09-11 DIAGNOSIS — L89311 Pressure ulcer of right buttock, stage 1: Secondary | ICD-10-CM | POA: Diagnosis not present

## 2019-09-11 DIAGNOSIS — Z794 Long term (current) use of insulin: Secondary | ICD-10-CM | POA: Diagnosis not present

## 2019-09-11 DIAGNOSIS — M25512 Pain in left shoulder: Secondary | ICD-10-CM | POA: Diagnosis not present

## 2019-09-11 DIAGNOSIS — E039 Hypothyroidism, unspecified: Secondary | ICD-10-CM | POA: Diagnosis not present

## 2019-09-11 DIAGNOSIS — I129 Hypertensive chronic kidney disease with stage 1 through stage 4 chronic kidney disease, or unspecified chronic kidney disease: Secondary | ICD-10-CM | POA: Diagnosis not present

## 2019-09-11 DIAGNOSIS — B372 Candidiasis of skin and nail: Secondary | ICD-10-CM | POA: Diagnosis not present

## 2019-09-11 DIAGNOSIS — N183 Chronic kidney disease, stage 3 (moderate): Secondary | ICD-10-CM | POA: Diagnosis not present

## 2019-09-11 DIAGNOSIS — E1140 Type 2 diabetes mellitus with diabetic neuropathy, unspecified: Secondary | ICD-10-CM | POA: Diagnosis not present

## 2019-09-11 DIAGNOSIS — E46 Unspecified protein-calorie malnutrition: Secondary | ICD-10-CM | POA: Diagnosis not present

## 2019-09-11 DIAGNOSIS — I69351 Hemiplegia and hemiparesis following cerebral infarction affecting right dominant side: Secondary | ICD-10-CM | POA: Diagnosis not present

## 2019-09-11 DIAGNOSIS — E1122 Type 2 diabetes mellitus with diabetic chronic kidney disease: Secondary | ICD-10-CM | POA: Diagnosis not present

## 2019-09-11 DIAGNOSIS — Z993 Dependence on wheelchair: Secondary | ICD-10-CM | POA: Diagnosis not present

## 2019-09-11 DIAGNOSIS — L89321 Pressure ulcer of left buttock, stage 1: Secondary | ICD-10-CM | POA: Diagnosis not present

## 2019-09-11 DIAGNOSIS — N179 Acute kidney failure, unspecified: Secondary | ICD-10-CM | POA: Diagnosis not present

## 2019-09-17 ENCOUNTER — Other Ambulatory Visit: Payer: Self-pay

## 2019-09-18 ENCOUNTER — Ambulatory Visit (INDEPENDENT_AMBULATORY_CARE_PROVIDER_SITE_OTHER): Payer: Medicare Other | Admitting: Family Medicine

## 2019-09-18 ENCOUNTER — Encounter: Payer: Self-pay | Admitting: Family Medicine

## 2019-09-18 VITALS — BP 126/80 | HR 70 | Temp 98.3°F | Resp 18

## 2019-09-18 DIAGNOSIS — Z23 Encounter for immunization: Secondary | ICD-10-CM

## 2019-09-18 DIAGNOSIS — S61451A Open bite of right hand, initial encounter: Secondary | ICD-10-CM

## 2019-09-18 DIAGNOSIS — W540XXA Bitten by dog, initial encounter: Secondary | ICD-10-CM

## 2019-09-18 DIAGNOSIS — S93402D Sprain of unspecified ligament of left ankle, subsequent encounter: Secondary | ICD-10-CM | POA: Diagnosis not present

## 2019-09-18 MED ORDER — AMOXICILLIN-POT CLAVULANATE 875-125 MG PO TABS: 1.0000 | ORAL_TABLET | Freq: Two times a day (BID) | ORAL | 0 refills | Status: DC

## 2019-09-18 MED ORDER — DICLOFENAC SODIUM 1 % TD GEL: TRANSDERMAL | 1 refills | Status: DC

## 2019-09-18 NOTE — Progress Notes (Signed)
Subjective:    Patient ID: [name], male    DOB: 1946-08-08, 73 y.o.   MRN: [id number]  Patient presents for Ankle Pain (S/P fall- had to wait 50mn for rescue to come get him up- pain in L side) and Dog BIte (neighbor's dog bit R hand- small laceration that is scabbed over- some swelling to hand) Patient here for ER follow-up for left ankle injury.  He fell trying to get into his wheelchair.  He was laying on the floor for about 30 minutes before EMS came he was seen in the emergency room on 9/25 x-rays done did not show any acute fracture. He is already on chronic pain medication,. He has known arthritis as well as gout he is on allopurinol for this. His wife was given an Ace wrap however she does not know how to put this on. He is unable to take any NSAIDs secondary to his chronic kidney disease.  Also state on Saturday he was bitten by a pit bull it was a dog of a family member.  Typically there is no problem with adults.  Is not aggressive but just jumped up on his hand and bit down.  Dog is up-to-date on his shots.  Wife cleaned it with peroxide but he still has some tenderness and swelling at the site.  IMPRESSION Aknle/Foot/TIB/FIB xrays 1.  No fracture or dislocation of the left tibia or fibula.  2. No fracture or dislocation of the left ankle. There is mild ankle mortise arthrosis with a probable nonacute osteochondral lesion of the medial talar dome, similar in appearance compared to prior examination dated 08/30/2015 with slightly worsened fibrosis. There is extensive soft tissue edema about the ankle.  3. No fracture or dislocation of the left foot. There is extensive erosive arthropathy about the first metatarsophalangeal joint and midfoot, most consistent with gouty arthropathy and generally in keeping with appearance on examination dated 08/29/2005. There is diffuse soft tissue edema about the foot.    Review Of Systems:  GEN- denies fatigue, fever, weight  loss,weakness, recent illness HEENT- denies eye drainage, change in vision, nasal discharge, CVS- denies chest pain, palpitations RESP- denies SOB, cough, wheeze ABD- denies N/V, change in stools, abd pain GU- denies dysuria, hematuria, dribbling, incontinence MSK- + joint pain, muscle aches, injury Neuro- denies headache, dizziness, syncope, seizure activity       Objective:    BP 126/80   Pulse 70   Temp 98.3 F (36.8 C) (Oral)   Resp 18   SpO2 99%  GEN- NAD, alert and oriented x3, sitting in wheelchair CVS- RRR, no murmur RESP-CTAB Skin- right hand small jagged bite 1-2 iches long with scab , no fluctuant area mild swelling and erythema, chronic contracture of the hand from his stroke Extremity chronic edema right lower extremity.  Swelling with tenderness around the ankle left side.  Decreased range of motion.  No bruising blisters erythema seen.       Assessment & Plan:      Problem List Items Addressed This Visit    None    Visit Diagnoses    Dog bite, hand, right, initial encounter    -  Primary   He had a superficial bite does not need any suturing and his already scabbed over.  He still has some pain and mild swelling so we will cover him with Augmentin and will also give him a tetanus booster   Relevant Orders   Tdap vaccine greater than or equal  to 7yo IM (Completed)   Sprain of left ankle, unspecified ligament, subsequent encounter       Will take at least 4 to 6 weeks to heal.  He was laying on the ankle for about 30 minutes before EMS came.  Will use topical Voltaren ice packs and Ace wrap which we showed his wife how to put on.      Note: This dictation was prepared with Dragon dictation along with smaller phrase technology. Any transcriptional errors that result from this process are unintentional.

## 2019-09-18 NOTE — Patient Instructions (Addendum)
Ace Wrap during the day  ICE the foot 10-15 minutes twice a day Try to elevate Use the volatren gel  Take antibiotics  TDAP given  Change F/U to December

## 2019-09-28 ENCOUNTER — Other Ambulatory Visit: Payer: Self-pay | Admitting: Family Medicine

## 2019-10-08 DIAGNOSIS — Z794 Long term (current) use of insulin: Secondary | ICD-10-CM | POA: Diagnosis not present

## 2019-10-08 DIAGNOSIS — E1122 Type 2 diabetes mellitus with diabetic chronic kidney disease: Secondary | ICD-10-CM | POA: Diagnosis not present

## 2019-10-08 DIAGNOSIS — N182 Chronic kidney disease, stage 2 (mild): Secondary | ICD-10-CM | POA: Diagnosis not present

## 2019-10-08 DIAGNOSIS — E559 Vitamin D deficiency, unspecified: Secondary | ICD-10-CM | POA: Diagnosis not present

## 2019-10-09 LAB — COMPLETE METABOLIC PANEL WITH GFR
AG Ratio: 1.4 (calc) (ref 1.0–2.5)
ALT: 9 U/L (ref 9–46)
AST: 10 U/L (ref 10–35)
Albumin: 3.7 g/dL (ref 3.6–5.1)
Alkaline phosphatase (APISO): 70 U/L (ref 35–144)
BUN/Creatinine Ratio: 18 (calc) (ref 6–22)
BUN: 25 mg/dL (ref 7–25)
CO2: 27 mmol/L (ref 20–32)
Calcium: 10.3 mg/dL (ref 8.6–10.3)
Chloride: 106 mmol/L (ref 98–110)
Creat: 1.42 mg/dL — ABNORMAL HIGH (ref 0.70–1.18)
GFR, Est African American: 57 mL/min/{1.73_m2} — ABNORMAL LOW (ref >=60)
GFR, Est Non African American: 49 mL/min/{1.73_m2} — ABNORMAL LOW (ref >=60)
Globulin: 2.7 g/dL (calc) (ref 1.9–3.7)
Glucose, Bld: 65 mg/dL (ref 65–99)
Potassium: 4.5 mmol/L (ref 3.5–5.3)
Sodium: 140 mmol/L (ref 135–146)
Total Bilirubin: 0.3 mg/dL (ref 0.2–1.2)
Total Protein: 6.4 g/dL (ref 6.1–8.1)

## 2019-10-09 LAB — HEMOGLOBIN A1C
Hgb A1c MFr Bld: 6.1 % of total Hgb — ABNORMAL HIGH (ref <5.7)
Mean Plasma Glucose: 128 (calc)
eAG (mmol/L): 7.1 (calc)

## 2019-10-09 LAB — VITAMIN D 25 HYDROXY (VIT D DEFICIENCY, FRACTURES): Vit D, 25-Hydroxy: 67 ng/mL (ref 30–100)

## 2019-10-11 ENCOUNTER — Ambulatory Visit: Payer: Medicare Other | Admitting: Family Medicine

## 2019-10-11 ENCOUNTER — Other Ambulatory Visit: Payer: Self-pay | Admitting: Family Medicine

## 2019-10-15 ENCOUNTER — Other Ambulatory Visit: Payer: Self-pay

## 2019-10-15 ENCOUNTER — Ambulatory Visit: Payer: Medicare Other | Admitting: "Endocrinology

## 2019-10-15 ENCOUNTER — Encounter: Payer: Self-pay | Admitting: "Endocrinology

## 2019-10-15 ENCOUNTER — Ambulatory Visit (INDEPENDENT_AMBULATORY_CARE_PROVIDER_SITE_OTHER): Payer: Medicare Other | Admitting: "Endocrinology

## 2019-10-15 DIAGNOSIS — Z794 Long term (current) use of insulin: Secondary | ICD-10-CM

## 2019-10-15 DIAGNOSIS — E559 Vitamin D deficiency, unspecified: Secondary | ICD-10-CM | POA: Diagnosis not present

## 2019-10-15 DIAGNOSIS — E1122 Type 2 diabetes mellitus with diabetic chronic kidney disease: Secondary | ICD-10-CM | POA: Diagnosis not present

## 2019-10-15 DIAGNOSIS — N182 Chronic kidney disease, stage 2 (mild): Secondary | ICD-10-CM

## 2019-10-15 DIAGNOSIS — E782 Mixed hyperlipidemia: Secondary | ICD-10-CM

## 2019-10-15 DIAGNOSIS — E039 Hypothyroidism, unspecified: Secondary | ICD-10-CM

## 2019-10-15 MED ORDER — LANTUS SOLOSTAR 100 UNIT/ML ~~LOC~~ SOPN: 30.0000 [IU] | PEN_INJECTOR | Freq: Every day | SUBCUTANEOUS | 2 refills | Status: DC

## 2019-10-15 NOTE — Progress Notes (Signed)
10/15/2019                                                    Endocrinology Telehealth Visit Follow up Note -During COVID -19 Pandemic  This visit type was conducted due to national recommendations for restrictions regarding the COVID-19 Pandemic  in an effort to limit this patient's exposure and mitigate transmission of the corona virus.  Due to his co-morbid illnesses, [name] is at  moderate to high risk for complications without adequate follow up.  This format is felt to be most appropriate for him at this time.  I connected with this patient on 10/15/2019   by telephone and verified that I am speaking with the correct person using two identifiers. [name], [birth year]. he has verbally consented to this visit. All issues noted in this document were discussed and addressed. The format was not optimal for physical exam.   Subjective:    Patient ID: [name], male    DOB: May 13, 1946,    Past Medical History:  Diagnosis Date  . Arthritis   . Asthma    "hx of"  . Barrett's esophagus    EGD 03/23/2011 & EGD 2/09 bx proven  . BPH (benign prostatic hyperplasia)   . Cancer of parotid gland ([place]) 11/23/12   Adenocarcinoma  . Chronic abdominal pain   . Chronic constipation   . Colon polyp 03/23/2011   tubular adenoma, Dr. Gala Romney  . 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 ([place]) 07/15/2011  . CVA (cerebral infarction) 1998   right sided deficit  . Delayed gastric emptying 2018  . Diverticulosis    TCS 03/23/11 pancolonic diverticula &TCS 5/08, pancolonic diverticula  . DM (diabetes mellitus) ([place])   . Edema of lower extremity 12/21/12   bilateral   . Esotropia of left eye   . GERD (gastroesophageal reflux disease)   . Glaucoma (increased eye pressure)   . Gout   . Gout   . Hemorrhagic colitis 06/06/2012.  [name] Hemorrhoids, internal 03/23/2011   tcs by Dr. Gala Romney  . Hepatitis    esosiniphilic, tx with prednisone  . Hiatal hernia   . History of radiation therapy  05/21/18- 05/30/18   Left Lung/ 54 Gy delivered in 3 fractions of 18 Gy. SBRT  . HTN (hypertension)   . Hx of radiation therapy 1974   right base of skull area-lymphoma  . Hyperlipidemia   . Lower facial weakness    Right  . Lymphoma ([place]) 1974   [facility], right base of skull area  . Neuropathy   . Non-small cell lung cancer ([place]) dx'd 04/26/18  . Peripheral edema    R>L legs  . Rash    chronic, recurrent, R>L legs  . Renal insufficiency   . Steatohepatitis    liver biopsy 2009  . Stroke [facility]) 1998   right hemiparesis/plegia   Past Surgical History:  Procedure Laterality Date  . BIOPSY  12/01/2016   Procedure: BIOPSY;  Surgeon: Daneil Dolin, MD;  Location: [facility];  Service: Endoscopy;;  duodenum, gastric, esophagus  . CHOLECYSTECTOMY    . COLONOSCOPY  03/23/11   Dr. Gala Romney  pancolonic diverticula, hemorrhoids, tubular adenoma.. next tcs 03/2016  . COLONOSCOPY WITH PROPOFOL N/A 12/01/2016   inadequate bowel prep precluded exam  .  ESOPHAGOGASTRODUODENOSCOPY  02/05/08   goblet cell metaplasia/negative for H.pylori  . ESOPHAGOGASTRODUODENOSCOPY  03/23/11   Dr. Gala Romney, barretts, hiatal hernia  . ESOPHAGOGASTRODUODENOSCOPY (EGD) WITH PROPOFOL N/A 12/01/2016   Dr. Gala Romney: Large amount of retained gastric contents precluded completion of the stomach. Mucosal changes were found in the stomach. Erosions and somewhat scalloped appearing mucosa present, reactive gastritis/no H pylori. Barrett's esophagus noted, no dysplasia on biopsy. Duodenal biopsies taken as well, benign, no evidence of eosinophilia.  . IR FLUORO GUIDED NEEDLE PLC ASPIRATION/INJECTION LOC  12/13/2018  . IR KYPHO LUMBAR INC FX REDUCE BONE BX UNI/BIL CANNULATION INC/IMAGING  12/13/2018  . IR RADIOLOGY PERIPHERAL GUIDED IV START  12/13/2018  . IR US GUIDE VASC ACCESS LEFT  12/13/2018  . MASS BIOPSY  11/01/2012   Procedure: NECK MASS BIOPSY;  Surgeon: Ascencion Dike, MD;  Location: [facility] OR[facility];  Service: ENT;  Laterality: Right;   Excisional Bx Right Neck Mass; attempted external jugular cutdown of left side  . PAROTIDECTOMY  11/24/2012   Procedure: PAROTIDECTOMY;  Surgeon: Ascencion Dike, MD;  Location: [place];  Service: ENT;  Laterality: N/A;  Total parotidectomy  . PLEURECTOMY    . right lymph node removal Right    behind right ear  . Right video-assisted thoracic surgery, pleurectomy, and pleurodesis  2011  . VIDEO BRONCHOSCOPY WITH ENDOBRONCHIAL NAVIGATION N/A 04/26/2018   Procedure: VIDEO BRONCHOSCOPY WITH ENDOBRONCHIAL NAVIGATION;  Surgeon: Melrose Nakayama, MD;  Location: [place];  Service: Thoracic;  Laterality: N/A;   Social History   Socioeconomic History  . Marital status: Married    Spouse name: Not on file  . Number of children: 1  . Years of education: Not on file  . Highest education level: Not on file  Occupational History  . Occupation: disabled  Social Needs  . Financial resource strain: Not on file  . Food insecurity    Worry: Not on file    Inability: Not on file  . Transportation needs    Medical: Not on file    Non-medical: Not on file  Tobacco Use  . Smoking status: Former Smoker    Packs/day: 3.00    Years: 25.00    Pack years: 75.00    Types: Cigarettes    Quit date: 03/01/1997    Years since quitting: 22.6  . Smokeless tobacco: Never Used  Substance and Sexual Activity  . Alcohol use: No  . Drug use: No  . Sexual activity: Not Currently  Lifestyle  . Physical activity    Days per week: Not on file    Minutes per session: Not on file  . Stress: Not on file  Relationships  . Social Herbalist on phone: Not on file    Gets together: Not on file    Attends religious service: Not on file    Active member of club or organization: Not on file    Attends meetings of clubs or organizations: Not on file    Relationship status: Not on file  Other Topics Concern  . Not on file  Social History Narrative  . Not on file   Outpatient Encounter Medications as of  10/15/2019  Medication Sig  . allopurinol (ZYLOPRIM) 300 MG tablet TAKE 1 TABLET BY MOUTH EVERY DAY  . AMITIZA 24 MCG capsule TAKE 1 CAPSULE(24 MCG) BY MOUTH TWICE DAILY WITH A MEAL (Patient taking differently: Take 24 mcg by mouth every morning. )  . amLODipine (NORVASC) 10 MG tablet TAKE 1  TABLET(10 MG) BY MOUTH DAILY  . amoxicillin-clavulanate (AUGMENTIN) 875-125 MG tablet Take 1 tablet by mouth 2 (two) times daily.  . Cholecalciferol (VITAMIN D3) 125 MCG (5000 UT) CAPS Take 1 capsule (5,000 Units total) by mouth daily. (Patient taking differently: Take 5,000 Units by mouth every morning. )  . cloNIDine (CATAPRES) 0.2 MG tablet TAKE 1 TABLET BY MOUTH THREE TIMES DAILY  . diclofenac sodium (VOLTAREN) 1 % GEL Apply twice a day for affected area on ankle as needed  . furosemide (LASIX) 40 MG tablet TAKE 1 TABLET BY MOUTH EVERY DAY  . gabapentin (NEURONTIN) 300 MG capsule TAKE 2 CAPSULES BY MOUTH TWICE DAILY, MAY TAKE AN ADDTIONAL 2 CAPSULES MIDDAY AS NEEDED FOR PAIN  . Insulin Glargine (LANTUS SOLOSTAR) 100 UNIT/ML Solostar Pen Inject 30 Units into the skin at bedtime.  [name] lisinopril (ZESTRIL) 20 MG tablet TAKE 1 TABLET BY MOUTH DAILY  . metFORMIN (GLUCOPHAGE) 500 MG tablet TAKE 1 TABLET BY MOUTH TWICE DAILY  . metoprolol succinate (TOPROL-XL) 50 MG 24 hr tablet TAKE 1 TABLET BY MOUTH EVERY DAY WITH OR IMMEDIATELY FOLLOWING A MEAL  . nystatin (MYCOSTATIN/NYSTOP) powder Apply topically 3 (three) times daily.  [name] oxyCODONE-acetaminophen (PERCOCET) 7.5-325 MG tablet Take 1 tablet by mouth every 4 (four) hours as needed.  . pantoprazole (PROTONIX) 40 MG tablet TAKE 1 TABLET BY MOUTH DAILY  . potassium chloride SA (K-DUR) 20 MEQ tablet TAKE 1 TABLET(20 MEQ) BY MOUTH DAILY (Patient taking differently: Take 20 mEq by mouth daily. )  . simvastatin (ZOCOR) 20 MG tablet TAKE 1 TABLET BY MOUTH EVERY NIGHT AT BEDTIME  . tamsulosin (FLOMAX) 0.4 MG CAPS capsule TAKE 1 CAPSULE BY MOUTH DAILY  . tiZANidine  (ZANAFLEX) 2 MG tablet TAKE 1 TABLET(2 MG) BY MOUTH TWICE DAILY AS NEEDED  . Triamcinolone Acetonide (TRIAMCINOLONE 0.1 % CREAM : EUCERIN) CREA Apply 1 application topically 2 (two) times daily as needed. (Patient taking differently: Apply 1 application topically 2 (two) times daily as needed for rash. )  . [DISCONTINUED] Insulin Glargine (LANTUS SOLOSTAR) 100 UNIT/ML Solostar Pen Inject 38 Units into the skin at bedtime.   No facility-administered encounter medications on file as of 10/15/2019.    ALLERGIES: No Known Allergies VACCINATION STATUS: Immunization History  Administered Date(s) Administered  . Fluad Quad(high Dose 65+) 09/06/2019  . Influenza Split 08/24/2012  . Influenza, High Dose Seasonal PF 10/31/2017, 10/02/2018  . Influenza,inj,Quad PF,6+ Mos 10/09/2013, 09/05/2014, 11/23/2015, 08/16/2016  . Pneumococcal Conjugate-13 01/07/2014  . Pneumococcal Polysaccharide-23 11/25/2012  . Tdap 09/18/2019    Diabetes He presents for his follow-up diabetic visit. He has type 2 diabetes mellitus. Onset time: diagnosed approx at age 33. His disease course has been improving. There are no hypoglycemic associated symptoms. Pertinent negatives for hypoglycemia include no confusion, pallor or seizures. Pertinent negatives for diabetes include no fatigue, no polydipsia, no polyphagia, no polyuria and no weakness. Symptoms are improving. Risk factors for coronary artery disease include diabetes mellitus, dyslipidemia, hypertension, male sex, obesity, sedentary lifestyle and tobacco exposure. Current diabetic treatment includes insulin injections. He is following a generally unhealthy diet. When asked about meal planning, he reported none. He has had a previous visit with a dietitian. He never participates in exercise. His home blood glucose trend is decreasing steadily. His breakfast blood glucose range is generally 130-140 mg/dl. His bedtime blood glucose range is generally 140-180 mg/dl. His overall  blood glucose range is 140-180 mg/dl. An ACE inhibitor/angiotensin II receptor blocker is being taken.  Hyperlipidemia This  is a chronic problem. The current episode started more than 1 year ago. The problem is uncontrolled. Recent lipid tests were reviewed and are variable. Exacerbating diseases include diabetes and obesity. Pertinent negatives include no myalgias. Risk factors for coronary artery disease include diabetes mellitus, dyslipidemia, hypertension, male sex and a sedentary lifestyle.    Review of systems: Limited as above.  Objective:    CMP  CMP Latest Ref Rng & Units 10/08/2019 08/01/2019 07/15/2019  Glucose 65 - 99 mg/dL 65 81 111(H)  BUN 7 - 25 mg/dL 25 29(H) 24(H)  Creatinine 0.70 - 1.18 mg/dL 1.42(H) 1.68(H) 1.74(H)  Sodium 135 - 146 mmol/L 140 135 137  Potassium 3.5 - 5.3 mmol/L 4.5 4.6 4.9  Chloride 98 - 110 mmol/L 106 104 102  CO2 20 - 32 mmol/L 27 23 24   Calcium 8.6 - 10.3 mg/dL 10.3 10.0 10.8(H)  Total Protein 6.1 - 8.1 g/dL 6.4 - 8.3(H)  Total Bilirubin 0.2 - 1.2 mg/dL 0.3 - 0.6  Alkaline Phos 38 - 126 U/L - - 84  AST 10 - 35 U/L 10 - 17  ALT 9 - 46 U/L 9 - 11    Diabetic Labs (most recent): Lab Results  Component Value Date   HGBA1C 6.1 (H) 10/08/2019   HGBA1C 6.3 (H) 06/06/2019   HGBA1C 6.4 (H) 02/28/2019   Lipid Panel     Component Value Date/Time   CHOL 151 11/01/2017 0828   TRIG 219 (H) 11/01/2017 0828   HDL 56 11/01/2017 0828   CHOLHDL 2.7 11/01/2017 0828   VLDL 66 (H) 05/04/2017 0931   LDLCALC 66 11/01/2017 0828     Assessment & Plan:   1. Type 2 diabetes mellitus with stage 2 chronic kidney disease and CVA -This is a telephone visit due to the coronavirus pandemic -His  previsit labs show A1c stable at 6.1%.  His blood glucose readings are reported to be near target. -  His diabetes is complicated by recurrent CVA, stage 2-3 renal insufficiency, and he remains at extremely high risk for more acute and chronic complications of diabetes  which include CAD, CVA, CKD, retinopathy, and neuropathy. -In May 2019, he was diagnosed with non-small cell lung cancer , status post multiple rounds of radiation treatment completed on May 30, 2018.    -I spoke with his wife who is  his caretaker and advised to lower her Lantus to 30 units nightly,  associated with strict monitoring of blood glucose 2 times a day-daily before breakfast and at bedtime.  He will not need prandial insulin for now.  He is advised to continue metformin 500 mg p.o. once daily-daily after breakfast , to be advanced if necessary. -Patient is not a candidate for SGLT2 inhibitors due to CKD, not a suitable candidate for incretin therapy.   2) hypertension: he is advised to home monitor blood pressure and report if > 140/90 on 2 separate readings. He is currently on metoprolol 50 mg p.o. daily, lisinopril 20 mg p.o. daily.  3) Lipids/HPL: Recent lipid panel showed controlled LDL at 66.  He is advised to continue simvastatin 20 mg p.o. nightly.    4) hypothyroidism:  - I advised him to continue   levothyroxine 88 g by mouth every morning.  - We discussed about the correct intake of his thyroid hormone, on empty stomach at fasting, with water, separated by at least 30 minutes from breakfast and other medications,  and separated by more than 4 hours from calcium, iron, multivitamins, acid reflux  medications (PPIs). -Patient is made aware of the fact that thyroid hormone replacement is needed for life, dose to be adjusted by periodic monitoring of thyroid function tests.  4) vitamin D deficiency -He is advised to continue vitamin D3 5000 units daily for the next 90 days.    - I advised patient to maintain close follow up with Dr. Buelah Manis for primary care needs.  - Patient Care Time Today:  25 min, of which >50% was spent in  counseling and the rest reviewing his  current and  previous labs/studies, previous treatments, his blood glucose readings, and medications' doses  and developing a plan for long-term care based on the latest recommendations for standards of care.   [name] participated in the discussions, expressed understanding, and voiced agreement with the above plans.  All questions were answered to his satisfaction. he is encouraged to contact clinic should he have any questions or concerns prior to his return visit.   Follow up plan: Return in about 4 months (around 02/12/2020) for Bring Meter and Logs- A1c in Office.  Glade Lloyd, MD Phone: [phone]  Fax: [phone]  This note was partially dictated with voice recognition software. Similar sounding words can be transcribed inadequately or may not  be corrected upon review.  10/15/2019, 3:26 PM

## 2019-10-17 DIAGNOSIS — H47293 Other optic atrophy, bilateral: Secondary | ICD-10-CM | POA: Diagnosis not present

## 2019-10-17 DIAGNOSIS — E119 Type 2 diabetes mellitus without complications: Secondary | ICD-10-CM | POA: Diagnosis not present

## 2019-10-17 DIAGNOSIS — H2512 Age-related nuclear cataract, left eye: Secondary | ICD-10-CM | POA: Diagnosis not present

## 2019-10-17 DIAGNOSIS — Z794 Long term (current) use of insulin: Secondary | ICD-10-CM | POA: Diagnosis not present

## 2019-10-17 DIAGNOSIS — Z961 Presence of intraocular lens: Secondary | ICD-10-CM | POA: Diagnosis not present

## 2019-10-17 LAB — HM DIABETES EYE EXAM

## 2019-10-23 ENCOUNTER — Other Ambulatory Visit: Payer: Self-pay

## 2019-10-23 MED ORDER — OXYCODONE-ACETAMINOPHEN 7.5-325 MG PO TABS: 1.0000 | ORAL_TABLET | ORAL | 0 refills | Status: DC | PRN

## 2019-10-23 NOTE — Telephone Encounter (Signed)
Requested Prescriptions   Pending Prescriptions Disp Refills  . oxyCODONE-acetaminophen (PERCOCET) 7.5-325 MG tablet 180 tablet 0    Sig: Take 1 tablet by mouth every 4 (four) hours as needed.    Last OV 09/18/2019  Last written 08/05/2019

## 2019-10-25 ENCOUNTER — Telehealth: Payer: Self-pay | Admitting: *Deleted

## 2019-10-25 NOTE — Telephone Encounter (Signed)
Called patient to inform of CT for 11-08-19 - arrival time- 5:45 pm @ [facility], no restrictions to test, patient to fu with Dr. Isidore Moos on 11-13-19 @ 3:50 pm for results, lvm for a return call

## 2019-10-25 NOTE — Telephone Encounter (Signed)
Returned patient's phone call, lvm for a return call

## 2019-11-05 ENCOUNTER — Ambulatory Visit: Payer: Self-pay | Admitting: Radiation Oncology

## 2019-11-08 ENCOUNTER — Other Ambulatory Visit: Payer: Self-pay

## 2019-11-08 ENCOUNTER — Ambulatory Visit (HOSPITAL_COMMUNITY)
Admission: RE | Admit: 2019-11-08 | Discharge: 2019-11-08 | Disposition: A | Discharge status: Home / Self Care | Payer: Medicare Other | Source: Ambulatory Visit | Attending: Radiation Oncology | Admitting: Radiation Oncology

## 2019-11-08 DIAGNOSIS — C3412 Malignant neoplasm of upper lobe, left bronchus or lung: Secondary | ICD-10-CM | POA: Diagnosis not present

## 2019-11-08 DIAGNOSIS — J439 Emphysema, unspecified: Secondary | ICD-10-CM | POA: Diagnosis not present

## 2019-11-09 ENCOUNTER — Other Ambulatory Visit: Payer: Self-pay | Admitting: "Endocrinology

## 2019-11-13 ENCOUNTER — Ambulatory Visit
Admission: RE | Admit: 2019-11-13 | Discharge: 2019-11-13 | Disposition: A | Discharge status: Home / Self Care | Payer: Medicare Other | Source: Ambulatory Visit | Attending: Radiation Oncology | Admitting: Radiation Oncology

## 2019-11-13 DIAGNOSIS — Z08 Encounter for follow-up examination after completed treatment for malignant neoplasm: Secondary | ICD-10-CM | POA: Diagnosis not present

## 2019-11-13 DIAGNOSIS — C3412 Malignant neoplasm of upper lobe, left bronchus or lung: Secondary | ICD-10-CM

## 2019-11-13 DIAGNOSIS — Z85118 Personal history of other malignant neoplasm of bronchus and lung: Secondary | ICD-10-CM | POA: Diagnosis not present

## 2019-11-13 NOTE — Progress Notes (Signed)
Radiation Oncology         [phone] ________________________________  Name: [name] MRN: [id number]  Date: 11/13/2019  DOB: 02-13-1946  Follow-Up  PHONE NOTE by telephone as patient was unable to access MyChart video during pandemic precautions  CC: Sonterra, [name], MD  [name], MD  Diagnosis and Prior Radiotherapy:    ICD-10-CM   1. Malignant neoplasm of upper lobe of left lung ([place])  C34.12      Non small cell lung cancer - adenocarcinoma Cancer Staging Malignant neoplasm of upper lobe of left lung ([place]) Staging form: Lung, AJCC 8th Edition - Clinical: Stage IB (cT2a, cN0, cM0) - Signed by [name], MD on 04/06/2018  05/21/2018 - 05/30/2018: Lung, Left/ 54 Gy delivered in 3 fractions of 18 Gy  CHIEF COMPLAINT:  lung cancer  Narrative:  I spoke with patient and his wife by phone today due to pandemic precautions.  He has no new complaints, breathing is stable.  I reviewed his CT chest images and informed them he does not have signs of any cancer at this time.  ALLERGIES:  has No Known Allergies.  Meds: Current Outpatient Medications  Medication Sig Dispense Refill   allopurinol (ZYLOPRIM) 300 MG tablet TAKE 1 TABLET BY MOUTH EVERY DAY 90 tablet 0   AMITIZA 24 MCG capsule TAKE 1 CAPSULE(24 MCG) BY MOUTH TWICE DAILY WITH A MEAL (Patient taking differently: Take 24 mcg by mouth every morning. ) 60 capsule 3   amLODipine (NORVASC) 10 MG tablet TAKE 1 TABLET(10 MG) BY MOUTH DAILY 90 tablet 1   amoxicillin-clavulanate (AUGMENTIN) 875-125 MG tablet Take 1 tablet by mouth 2 (two) times daily. 14 tablet 0   Cholecalciferol (VITAMIN D3) 125 MCG (5000 UT) CAPS Take 1 capsule (5,000 Units total) by mouth daily. (Patient taking differently: Take 5,000 Units by mouth every morning. ) 90 capsule 0   cloNIDine (CATAPRES) 0.2 MG tablet TAKE 1 TABLET BY MOUTH THREE TIMES DAILY 270 tablet 0   diclofenac sodium (VOLTAREN) 1 % GEL Apply twice a day for affected area  on ankle as needed 100 g 1   furosemide (LASIX) 40 MG tablet TAKE 1 TABLET BY MOUTH EVERY DAY 90 tablet 0   gabapentin (NEURONTIN) 300 MG capsule TAKE 2 CAPSULES BY MOUTH TWICE DAILY, MAY TAKE AN ADDTIONAL 2 CAPSULES MIDDAY AS NEEDED FOR PAIN 540 capsule 0   Insulin Glargine (LANTUS SOLOSTAR) 100 UNIT/ML Solostar Pen Inject 30 Units into the skin at bedtime. 15 mL 2   lisinopril (ZESTRIL) 20 MG tablet TAKE 1 TABLET BY MOUTH DAILY 90 tablet 0   metFORMIN (GLUCOPHAGE) 500 MG tablet TAKE 1 TABLET BY MOUTH TWICE DAILY 180 tablet 0   metoprolol succinate (TOPROL-XL) 50 MG 24 hr tablet TAKE 1 TABLET BY MOUTH EVERY DAY WITH OR IMMEDIATELY FOLLOWING A MEAL 90 tablet 0   nystatin (MYCOSTATIN/NYSTOP) powder Apply topically 3 (three) times daily. 60 g 2   oxyCODONE-acetaminophen (PERCOCET) 7.5-325 MG tablet Take 1 tablet by mouth every 4 (four) hours as needed. 180 tablet 0   pantoprazole (PROTONIX) 40 MG tablet TAKE 1 TABLET BY MOUTH DAILY 90 tablet 0   potassium chloride SA (K-DUR) 20 MEQ tablet TAKE 1 TABLET(20 MEQ) BY MOUTH DAILY (Patient taking differently: Take 20 mEq by mouth daily. ) 90 tablet 1   simvastatin (ZOCOR) 20 MG tablet TAKE 1 TABLET BY MOUTH EVERY NIGHT AT BEDTIME 90 tablet 0   tamsulosin (FLOMAX) 0.4 MG CAPS capsule TAKE 1 CAPSULE BY  MOUTH DAILY 90 capsule 0   tiZANidine (ZANAFLEX) 2 MG tablet TAKE 1 TABLET(2 MG) BY MOUTH TWICE DAILY AS NEEDED 180 tablet 2   Triamcinolone Acetonide (TRIAMCINOLONE 0.1 % CREAM : EUCERIN) CREA Apply 1 application topically 2 (two) times daily as needed. (Patient taking differently: Apply 1 application topically 2 (two) times daily as needed for rash. ) 1 each 2   No current facility-administered medications for this encounter.     Physical Findings:    vitals were not taken for this visit.      Lab Findings: Lab Results  Component Value Date   WBC 9.8 07/15/2019   HGB 10.4 (L) 07/15/2019   HCT 34.2 (L) 07/15/2019   MCV 76.5 (L)  07/15/2019   PLT 195 07/15/2019    Radiographic Findings: Ct Chest Wo Contrast  Result Date: 11/08/2019 CLINICAL DATA:  73 year old male with history of non-small cell lung cancer. Follow-up study. EXAM: CT CHEST WITHOUT CONTRAST TECHNIQUE: Multidetector CT imaging of the chest was performed following the standard protocol without IV contrast. COMPARISON:  Chest CT 05/09/2019. FINDINGS: Cardiovascular: Heart size is normal. There is no significant pericardial fluid, thickening or pericardial calcification. There is aortic atherosclerosis, as well as atherosclerosis of the great vessels of the mediastinum and the coronary arteries, including calcified atherosclerotic plaque in the left main, left anterior descending and left circumflex coronary arteries. Calcifications of the aortic valve and inferior mitral annulus. Ectasia of the ascending thoracic aorta (4.4 cm in diameter). Mediastinum/Nodes: No pathologically enlarged mediastinal or hilar lymph nodes. Please note that accurate exclusion of hilar adenopathy is limited on noncontrast CT scans. Esophagus is unremarkable in appearance. No axillary lymphadenopathy. Lungs/Pleura: Again noted is a chronic mass-like area of architectural distortion in the left upper lobe (axial image 44 of series 4) which currently measures 6.2 x 3.0 cm, similar to prior examinations, most compatible with chronic postradiation mass-like fibrosis. 4 mm right lower lobe nodule (axial image 128 of series 4), stable and likely benign. No other definite suspicious appearing pulmonary nodules or masses are noted. No acute consolidative airspace disease. No pleural effusions. Diffuse bronchial wall thickening with mild to moderate centrilobular and paraseptal emphysema. Suture line in the right upper lobe related to prior wedge resection. Upper Abdomen: Aortic atherosclerosis. Status post cholecystectomy. Multiple low-attenuation lesions in both kidneys, incompletely characterized on  today's non-contrast CT examination, but statistically likely to represent cysts, measuring up to at least 4.1 cm in the interpolar region of the left kidney. Musculoskeletal: Chronic compression fracture of L2 with post vertebroplasty changes and 50% loss of anterior vertebral body height, similar to prior examinations. Multiple old healed right-sided posterior rib fractures are incidentally noted. There are no aggressive appearing lytic or blastic lesions noted in the visualized portions of the skeleton. IMPRESSION: 1. Chronic postradiation mass-like fibrosis in the left upper lobe, similar to prior examinations. No definite findings to suggest recurrent or metastatic disease in the thorax on today's examination. 2. Aortic atherosclerosis, in addition to left main and 2 vessel coronary artery disease. Assessment for potential risk factor modification, dietary therapy or pharmacologic therapy may be warranted, if clinically indicated. 3. Ectasia of the ascending thoracic aorta (4.4 cm in diameter), unchanged. Recommend annual imaging followup by CTA or MRA. This recommendation follows 2010 ACCF/AHA/AATS/ACR/ASA/SCA/SCAI/SIR/STS/SVM Guidelines for the Diagnosis and Management of Patients with Thoracic Aortic Disease. Circulation. 2010; 121: P329-J188. Aortic aneurysm NOS (ICD10-I71.9). 4. There are calcifications of the aortic valve and mitral annulus. Echocardiographic correlation for evaluation of potential valvular  dysfunction may be warranted if clinically indicated. 5. Diffuse bronchial wall thickening with mild to moderate centrilobular and paraseptal emphysema. Aortic Atherosclerosis (ICD10-I70.0) and Emphysema (ICD10-J43.9). Electronically Signed   By: Vinnie Langton M.D.   On: 11/08/2019 19:08    Impression/Plan:  NSCLC, in remission after radiation treatment  I reviewed the recent chest CT scan with the patient and his wife today. No evidence of recurrence was seen.  Plan: Follow up in 6 months  with a repeat Chest CT at that time.  Ascending Aortic aneurysm NOS: I discussed this with the patient's wife and let her know that annual CTA versus MRA could be considered but the risks and benefits of such procedures need to be considered in the context of his other significant medical issues.  I recommended that she discuss this with Dr. Buelah Manis, the patient's PCP.  I will also send a note to Dr. Buelah Manis to make sure she is aware of these imaging findings and defer to her and the patient and his family on whether they want to monitor this proactively.  This encounter was provided by telephone due to pandemic precautions; pt cannot use MyChart video. The patient has given verbal consent for this type of encounter and has been advised to only accept a meeting of this type in a secure network environment. The time spent during this encounter was over 5 minutes. The attendants for this meeting include [name]  and [name].  His wife was present as well and provided all of the history and engaged in all of the discussion. During the encounter, [name] was located at [facility] Radiation Oncology Department.  [name] was located at home.     _____________________________________   [name], MD  This document serves as a record of services personally performed by [name], MD. It was created on her behalf by Wilburn Mylar, a trained medical scribe. The creation of this record is based on the scribe's personal observations and the provider's statements to them. This document has been checked and approved by the attending provider.

## 2019-11-15 ENCOUNTER — Other Ambulatory Visit: Payer: Self-pay | Admitting: Radiation Oncology

## 2019-11-15 ENCOUNTER — Encounter: Payer: Self-pay | Admitting: Radiation Oncology

## 2019-11-15 DIAGNOSIS — C3412 Malignant neoplasm of upper lobe, left bronchus or lung: Secondary | ICD-10-CM

## 2019-11-17 ENCOUNTER — Other Ambulatory Visit: Payer: Self-pay | Admitting: Nurse Practitioner

## 2019-11-19 ENCOUNTER — Ambulatory Visit (INDEPENDENT_AMBULATORY_CARE_PROVIDER_SITE_OTHER): Payer: Medicare Other | Admitting: Family Medicine

## 2019-11-19 ENCOUNTER — Other Ambulatory Visit: Payer: Self-pay | Admitting: "Endocrinology

## 2019-11-19 ENCOUNTER — Encounter: Payer: Self-pay | Admitting: Internal Medicine

## 2019-11-19 ENCOUNTER — Other Ambulatory Visit: Payer: Self-pay

## 2019-11-19 ENCOUNTER — Encounter: Payer: Self-pay | Admitting: Family Medicine

## 2019-11-19 DIAGNOSIS — I10 Essential (primary) hypertension: Secondary | ICD-10-CM

## 2019-11-19 DIAGNOSIS — I712 Thoracic aortic aneurysm, without rupture, unspecified: Secondary | ICD-10-CM

## 2019-11-19 DIAGNOSIS — I69959 Hemiplegia and hemiparesis following unspecified cerebrovascular disease affecting unspecified side: Secondary | ICD-10-CM | POA: Diagnosis not present

## 2019-11-19 DIAGNOSIS — I700 Atherosclerosis of aorta: Secondary | ICD-10-CM

## 2019-11-19 DIAGNOSIS — N1831 Chronic kidney disease, stage 3a: Secondary | ICD-10-CM

## 2019-11-19 DIAGNOSIS — N182 Chronic kidney disease, stage 2 (mild): Secondary | ICD-10-CM

## 2019-11-19 DIAGNOSIS — E1122 Type 2 diabetes mellitus with diabetic chronic kidney disease: Secondary | ICD-10-CM

## 2019-11-19 DIAGNOSIS — Z794 Long term (current) use of insulin: Secondary | ICD-10-CM

## 2019-11-19 MED ORDER — ALLOPURINOL 300 MG PO TABS: 300.0000 mg | ORAL_TABLET | Freq: Every day | ORAL | 1 refills | Status: DC

## 2019-11-19 MED ORDER — TAMSULOSIN HCL 0.4 MG PO CAPS: 0.4000 mg | ORAL_CAPSULE | Freq: Every day | ORAL | 2 refills | Status: DC

## 2019-11-19 MED ORDER — SIMVASTATIN 20 MG PO TABS: 20.0000 mg | ORAL_TABLET | Freq: Every day | ORAL | 2 refills | Status: DC

## 2019-11-19 MED ORDER — LISINOPRIL 20 MG PO TABS: 20.0000 mg | ORAL_TABLET | Freq: Every day | ORAL | 2 refills | Status: DC

## 2019-11-19 MED ORDER — PANTOPRAZOLE SODIUM 40 MG PO TBEC: 40.0000 mg | DELAYED_RELEASE_TABLET | Freq: Every day | ORAL | 2 refills | Status: DC

## 2019-11-19 MED ORDER — AMLODIPINE BESYLATE 10 MG PO TABS: ORAL_TABLET | ORAL | 1 refills | Status: DC

## 2019-11-19 MED ORDER — CLONIDINE HCL 0.2 MG PO TABS: 0.2000 mg | ORAL_TABLET | Freq: Three times a day (TID) | ORAL | 2 refills | Status: DC

## 2019-11-19 MED ORDER — FUROSEMIDE 40 MG PO TABS: 40.0000 mg | ORAL_TABLET | Freq: Every day | ORAL | 2 refills | Status: DC

## 2019-11-19 NOTE — Telephone Encounter (Signed)
PATIENT SCHEDULED  °

## 2019-11-19 NOTE — Telephone Encounter (Signed)
Noted  

## 2019-11-19 NOTE — Progress Notes (Signed)
Virtual Visit via Telephone Note  I connected with BRYSTEN REISTER on 11/19/19 at 10:26am by telephone and verified that I am speaking with the correct person using two identifiers.      Pt location: at home   Physician location:  In office, [facility], Vic Blackbird MD     On call: patient wife( Care giver) and physician   I discussed the limitations, risks, security and privacy concerns of performing an evaluation and management service by telephone and the availability of in person appointments. I also discussed with the patient that there may be a patient responsible charge related to this service. The patient expressed understanding and agreed to proceed.   History of Present Illness:   Using powder on sacral decub that has healed up      DM- last A1C 6.1%, this AM cbg was 69, in the evening was up to 916 when he eats certain snaclks, he is also on metformin 525m once a day, his lantus was decreased to 30 units recently due to hypoglycemia and improved A1C  he was seen by Dr. SGershon Cranerecently , no changes , has small cataract on left eye   HTN- blood pressure has been controlled, wife takes bp   Aortic atherosclerosis/ Hyperlipidemia- noted on previous scans, on zocor  Recently seen by oncology secondary to his history of lung cancer.  He had his routine CT of chest which actually showed concerning ectasia of the thoracic aorta/aneurysm this was sent to me to follow-up as he is high risk for surgical intervention.  Discussed this with patient's wife they also had discussion with with Mr. GLarosehimself.  He understands that he is high risk for surgical intervention on this and at this time is chosen to just monitor..   Has a long thick nail on right toenail that is painful , nail does crumble when she cuts -  Last [facility]- Dr. MPrudence Davidson  Previous dog bite infection./ ankle sprain also healed   Meds reviewed   Observations/Objective: Unable to visualize over  the phone  Assessment and Plan: Thoracic aortic ectasia/aneurysm currently 4 cm in diameter he has very high risk for surgical intervention at this time we are just going to monitor.  Family is on board with this.  He is actually have another CT scan in 6 months by oncology already.    Aortic atherosclerosis hyperlipidemia continue simvastatin he will need lipid panel at his next visit  Diabetes mellitus defer to his endocrinologist recent decrease in his insulin.  A1c at goal.  Kidney disease.  His creatinine is at his baseline about 1.4  Pretension blood pressure well controlled.  Hypertrophic nails with likely fungus in the setting of COVID-19 wife prefers not to have to get him out if needed.  She states there is no redness no drainage around the nail.  Advised her to try to trim it back some or file it down to keep it from snacking which may cause some discomfort.  If this does not help and she can definitely go ahead and schedule with podiatry as he is already a patient with right foot cnter  HemiPlegia of the right side status post stroke years ago wheelchair bound  Chronic abd pain- no change to hydrocodone  Follow Up Instructions:    I discussed the assessment and treatment plan with the patient. The patient was provided an opportunity to ask questions and all were answered. The patient agreed with the plan  and demonstrated an understanding of the instructions.   The patient was advised to call back or seek an in-person evaluation if the symptoms worsen or if the condition fails to improve as anticipated.  I provided 18 minutes of non-face-to-face time during this encounter. End Time  10:44am  Vic Blackbird, MD

## 2019-11-19 NOTE — Telephone Encounter (Signed)
Sending in limited 3 month supply of Amitiza 24 mcg BID. Patient has not been seen since 2018. He needs to schedule follow-up appointment to receive additional refills.

## 2019-11-27 ENCOUNTER — Encounter: Payer: Self-pay | Admitting: Internal Medicine

## 2019-12-09 ENCOUNTER — Other Ambulatory Visit: Payer: Self-pay | Admitting: Family Medicine

## 2019-12-26 ENCOUNTER — Ambulatory Visit: Payer: Medicare Other | Admitting: Nurse Practitioner

## 2019-12-28 ENCOUNTER — Other Ambulatory Visit: Payer: Self-pay | Admitting: Family Medicine

## 2020-01-09 ENCOUNTER — Other Ambulatory Visit: Payer: Self-pay | Admitting: *Deleted

## 2020-01-09 NOTE — Telephone Encounter (Signed)
Received call from patient spouse, [name].   Requested refill on Oxycodone/ APAP.    Ok to refill??  Last office visit 11/19/2019.  Last refill 10/23/2019.

## 2020-01-10 MED ORDER — OXYCODONE-ACETAMINOPHEN 7.5-325 MG PO TABS: 1.0000 | ORAL_TABLET | ORAL | 0 refills | Status: DC | PRN

## 2020-01-19 ENCOUNTER — Other Ambulatory Visit: Payer: Self-pay

## 2020-01-19 ENCOUNTER — Ambulatory Visit: Payer: Medicare Other | Attending: Internal Medicine

## 2020-01-19 DIAGNOSIS — Z23 Encounter for immunization: Secondary | ICD-10-CM | POA: Insufficient documentation

## 2020-01-19 NOTE — Progress Notes (Signed)
   Covid-19 Vaccination Clinic  Name:  [name]    MRN: [id number] DOB: 1946-03-26  01/19/2020  [name] was observed post Covid-19 immunization for 15 minutes without incidence. He was provided with Vaccine Information Sheet and instruction to access the V-Safe system.   [name] was instructed to call 911 with any severe reactions post vaccine: [name] Difficulty breathing  . Swelling of your face and throat  . A fast heartbeat  . A bad rash all over your body  . Dizziness and weakness    Immunizations Administered    Name Date Dose VIS Date Route   Moderna COVID-19 Vaccine 01/19/2020  3:26 PM 0.5 mL 11/12/2019 Intramuscular   Manufacturer: Moderna   Lot: [id number]   [place]: [id number]

## 2020-02-10 ENCOUNTER — Other Ambulatory Visit: Payer: Self-pay | Admitting: Family Medicine

## 2020-02-11 IMAGING — CT CT ABDOMEN AND PELVIS WITHOUT CONTRAST
2 of 4 series · 16 of 46 positions shown, 18 images · non-contrast
Comparison: 12/01/2018

CLINICAL DATA: Chronic abdominal pain. Abnormal weight loss.
Personal history of lung carcinoma, parotid carcinoma, and lymphoma.

EXAM:
CT ABDOMEN AND PELVIS WITHOUT CONTRAST
TECHNIQUE: Multidetector CT imaging of the abdomen and pelvis was performed
following the standard protocol without IV contrast.

[Series 2: axial st · axial · 0.85mm/px · z∈[-844,-389]mm · 13 of 103 slices shown, 15 images]
[im 6/103  soft-tissue]
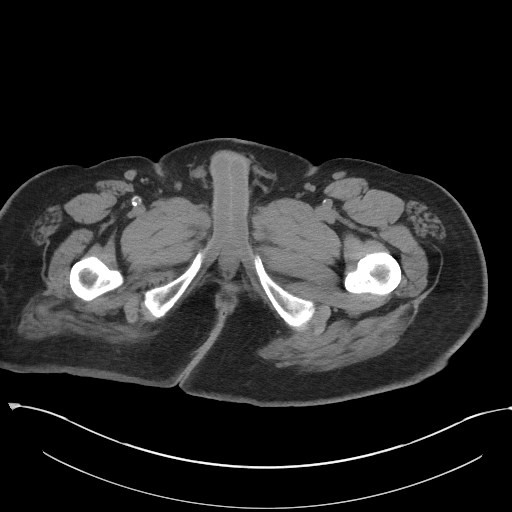
[im 6/103  bone]
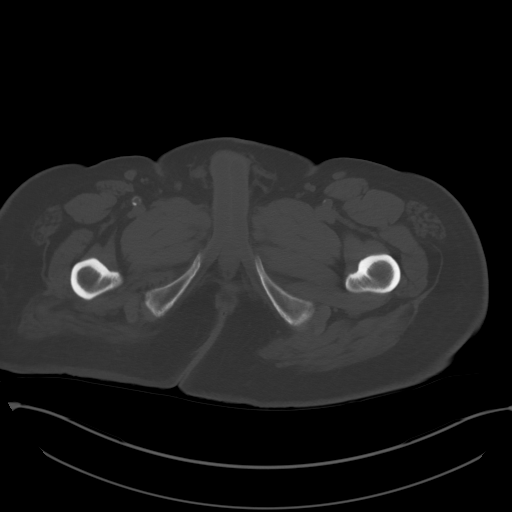
[im 16/103  soft-tissue]
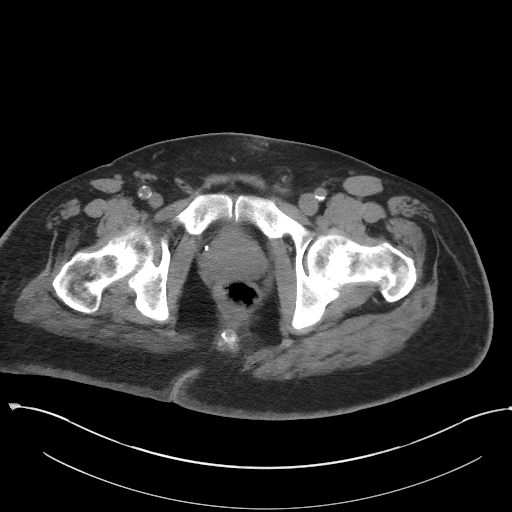
[im 21/103  soft-tissue]
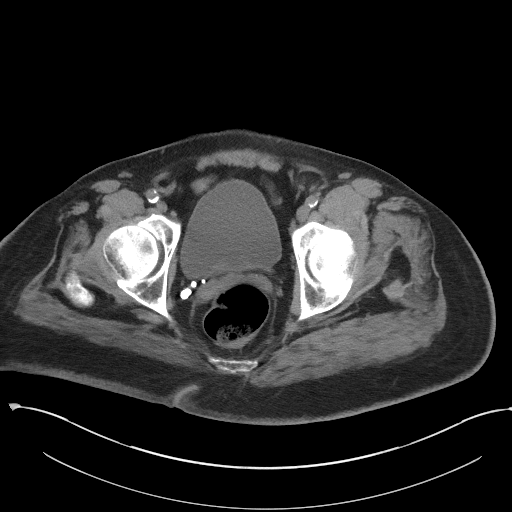
[im 31/103  soft-tissue]
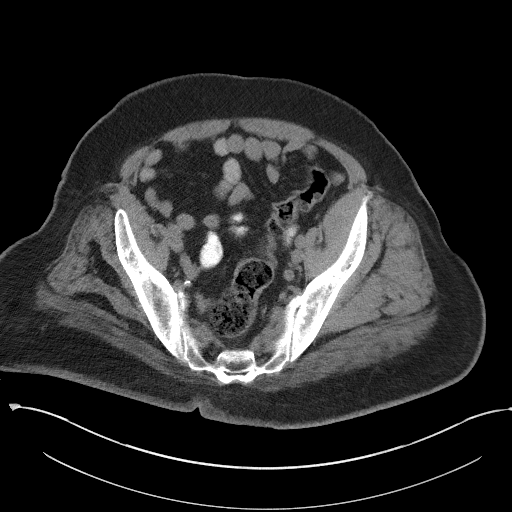
[im 36/103  soft-tissue]
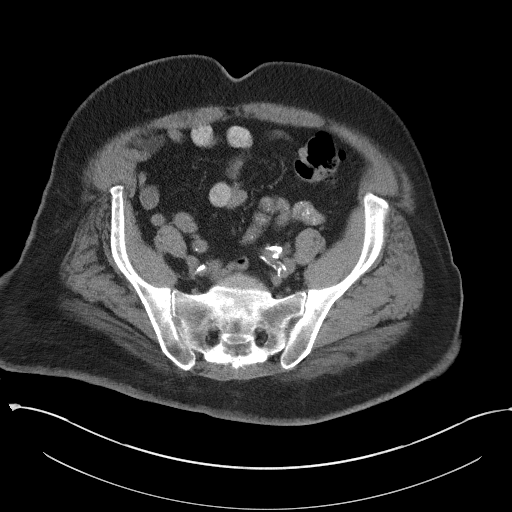
[im 46/103  soft-tissue]
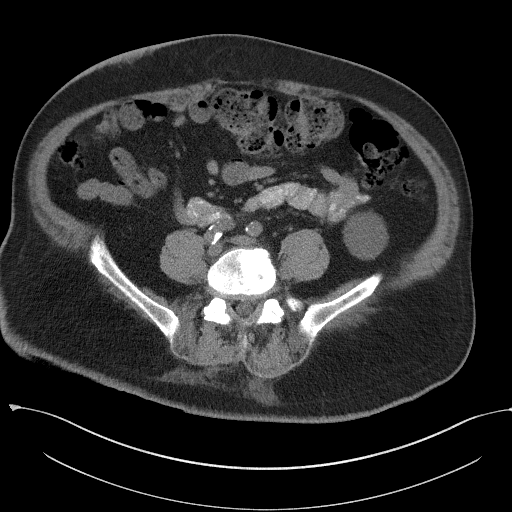
[im 52/103  soft-tissue]
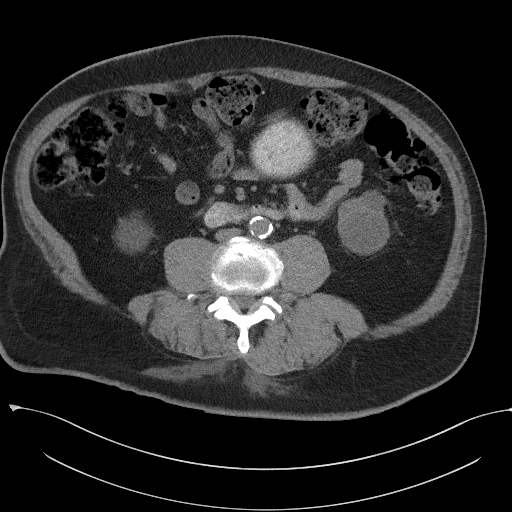
[im 57/103  soft-tissue]
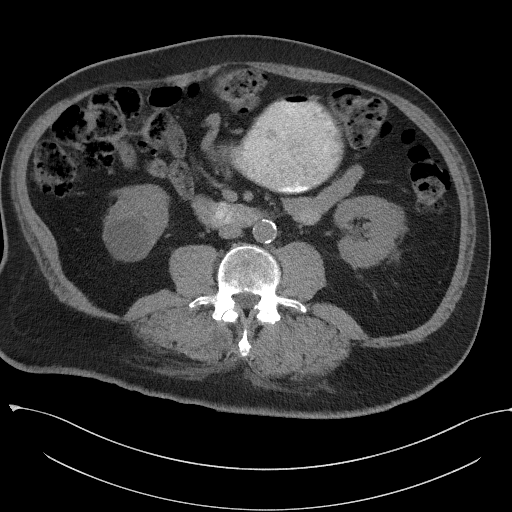
[im 67/103  soft-tissue]
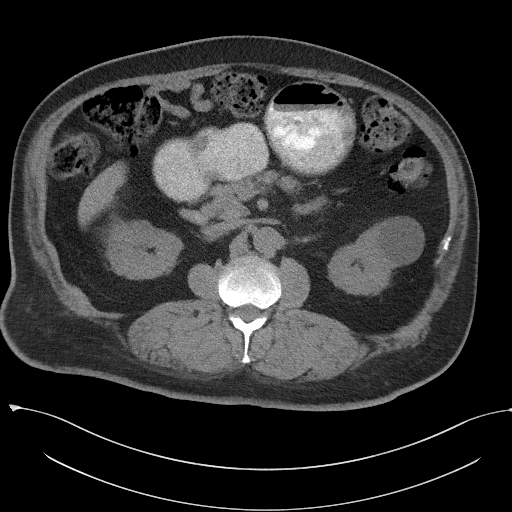
[im 67/103  bone]
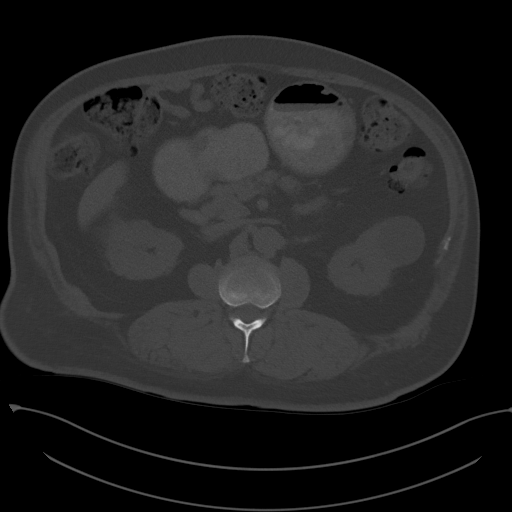
[im 72/103  soft-tissue]
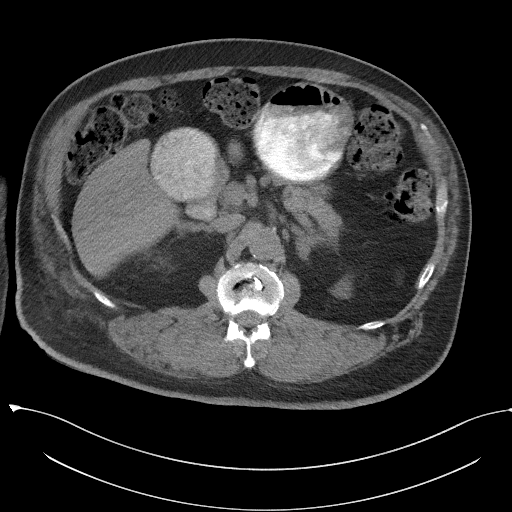
[im 82/103  soft-tissue]
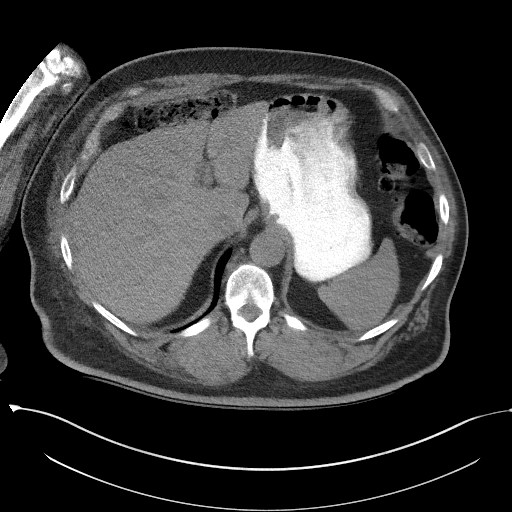
[im 87/103  soft-tissue]
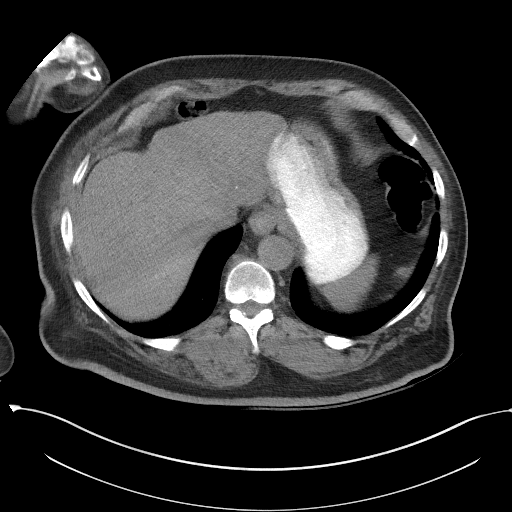
[im 97/103  soft-tissue]
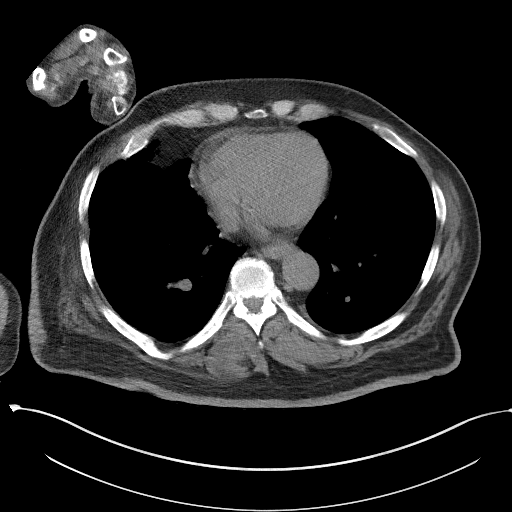

[Series 5: coronal st · coronal · 0.90mm/px · 3 of 134 slices shown]
[im 45/134  soft-tissue]
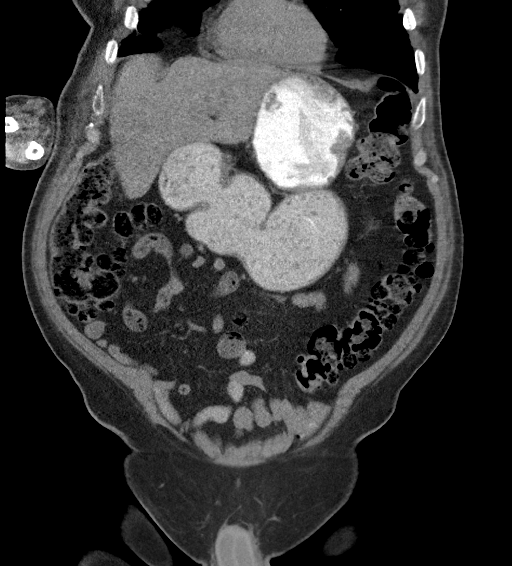
[im 60/134  soft-tissue]
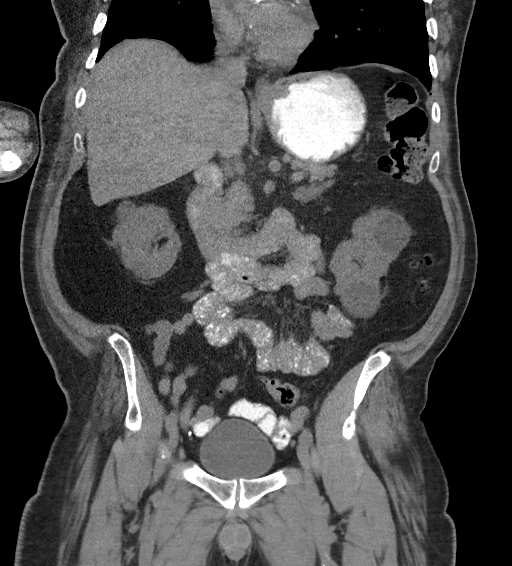
[im 74/134  soft-tissue]
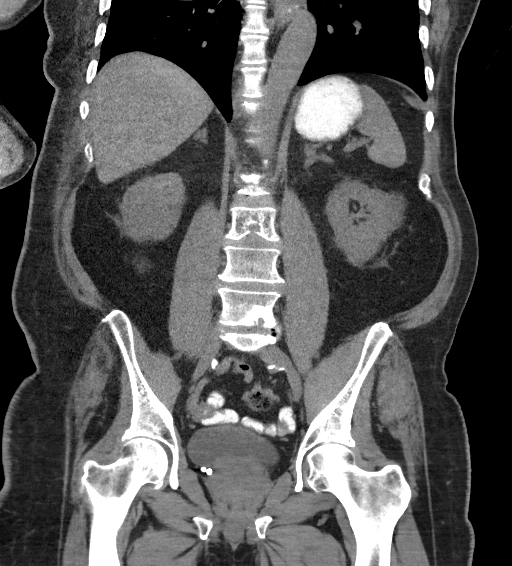

[16 of 46 positions shown; findings below may reference images not displayed]

FINDINGS: Lower chest: No acute findings.

Hepatobiliary: No mass visualized on this unenhanced exam. Prior
cholecystectomy. No evidence of biliary obstruction.

Pancreas: No mass or inflammatory process visualized on this
unenhanced exam.

Spleen:  Within normal limits in size.

Adrenals/Urinary tract: No evidence of urolithiasis or
hydronephrosis. Fluid attenuation renal cysts are again seen
bilaterally. Unremarkable unopacified urinary bladder.

Stomach/Bowel: No evidence of obstruction, inflammatory process, or
abnormal fluid collections. Normal appendix visualized. Colonic
diverticulosis again noted, however there is no evidence of
diverticulitis. Large colonic stool burden also noted.

Vascular/Lymphatic: No pathologically enlarged lymph nodes
identified. No evidence of abdominal aortic aneurysm. Aortic
atherosclerosis.

Reproductive:  Stable mildly enlarged prostate gland.

Other: Stable small paraumbilical ventral hernia containing only
fat.

Musculoskeletal: No suspicious bone lesions identified. Old L1
vertebral body compression fracture and prior vertebroplasty again
noted.
IMPRESSION: 1. No evidence of urolithiasis, hydronephrosis, or other acute
findings.
2. Colonic diverticulosis. No radiographic evidence of
diverticulitis.
3. Large stool burden noted; recommend clinical correlation for
possible constipation.
4. Stable mildly enlarged prostate.
5. Stable small paraumbilical ventral hernia containing only fat.

## 2020-02-13 ENCOUNTER — Ambulatory Visit (INDEPENDENT_AMBULATORY_CARE_PROVIDER_SITE_OTHER): Payer: Medicare Other | Admitting: "Endocrinology

## 2020-02-13 ENCOUNTER — Encounter: Payer: Self-pay | Admitting: "Endocrinology

## 2020-02-13 VITALS — BP 131/89 | HR 75

## 2020-02-13 DIAGNOSIS — E559 Vitamin D deficiency, unspecified: Secondary | ICD-10-CM | POA: Diagnosis not present

## 2020-02-13 DIAGNOSIS — E782 Mixed hyperlipidemia: Secondary | ICD-10-CM

## 2020-02-13 DIAGNOSIS — E039 Hypothyroidism, unspecified: Secondary | ICD-10-CM

## 2020-02-13 DIAGNOSIS — E1122 Type 2 diabetes mellitus with diabetic chronic kidney disease: Secondary | ICD-10-CM

## 2020-02-13 DIAGNOSIS — Z794 Long term (current) use of insulin: Secondary | ICD-10-CM

## 2020-02-13 DIAGNOSIS — N182 Chronic kidney disease, stage 2 (mild): Secondary | ICD-10-CM

## 2020-02-13 MED ORDER — LANTUS SOLOSTAR 100 UNIT/ML ~~LOC~~ SOPN: 42.0000 [IU] | PEN_INJECTOR | Freq: Every day | SUBCUTANEOUS | 2 refills | Status: DC

## 2020-02-13 NOTE — Progress Notes (Signed)
02/13/2020  Endocrinology follow-up note   Subjective:    Patient ID: [name], male    DOB: 05/01/1946,    Past Medical History:  Diagnosis Date  . Arthritis   . Asthma    "hx of"  . Barrett's esophagus    EGD 03/23/2011 & EGD 2/09 bx proven  . BPH (benign prostatic hyperplasia)   . Cancer of parotid gland ([place]) 11/23/12   Adenocarcinoma  . Chronic abdominal pain   . Chronic constipation   . Colon polyp 03/23/2011   tubular adenoma, Dr. Gala Romney  . 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 ([place]) 07/15/2011  . CVA (cerebral infarction) 1998   right sided deficit  . Delayed gastric emptying 2018  . Diverticulosis    TCS 03/23/11 pancolonic diverticula &TCS 5/08, pancolonic diverticula  . DM (diabetes mellitus) ([place])   . Edema of lower extremity 12/21/12   bilateral   . Esotropia of left eye   . GERD (gastroesophageal reflux disease)   . Glaucoma (increased eye pressure)   . Gout   . Gout   . Hemorrhagic colitis 06/06/2012.  [name] Hemorrhoids, internal 03/23/2011   tcs by Dr. Gala Romney  . Hepatitis    esosiniphilic, tx with prednisone  . Hiatal hernia   . History of radiation therapy 05/21/18- 05/30/18   Left Lung/ 54 Gy delivered in 3 fractions of 18 Gy. SBRT  . HTN (hypertension)   . Hx of radiation therapy 1974   right base of skull area-lymphoma  . Hyperlipidemia   . Lower facial weakness    Right  . Lymphoma ([place]) 1974   XRT at [facility], right base of skull area  . Neuropathy   . Non-small cell lung cancer ([place]) dx'd 04/26/18  . Peripheral edema    R>L legs  . Rash    chronic, recurrent, R>L legs  . Renal insufficiency   . Steatohepatitis    liver biopsy 2009  . Stroke [facility]) 1998   right hemiparesis/plegia   Past Surgical History:  Procedure Laterality Date  . BIOPSY  12/01/2016   Procedure: BIOPSY;  Surgeon: Daneil Dolin, MD;  Location: [facility] END[facility] SUITE;  Service: Endoscopy;;  duodenum, gastric, esophagus  . CHOLECYSTECTOMY    . COLONOSCOPY  03/23/11    Dr. Gala Romney  pancolonic diverticula, hemorrhoids, tubular adenoma.. next tcs 03/2016  . COLONOSCOPY WITH PROPOFOL N/A 12/01/2016   inadequate bowel prep precluded exam  . ESOPHAGOGASTRODUODENOSCOPY  02/05/08   goblet cell metaplasia/negative for H.pylori  . ESOPHAGOGASTRODUODENOSCOPY  03/23/11   Dr. Gala Romney, barretts, hiatal hernia  . ESOPHAGOGASTRODUODENOSCOPY (EGD) WITH PROPOFOL N/A 12/01/2016   Dr. Gala Romney: Large amount of retained gastric contents precluded completion of the stomach. Mucosal changes were found in the stomach. Erosions and somewhat scalloped appearing mucosa present, reactive gastritis/no H pylori. Barrett's esophagus noted, no dysplasia on biopsy. Duodenal biopsies taken as well, benign, no evidence of eosinophilia.  . IR FLUORO GUIDED NEEDLE PLC ASPIRATION/INJECTION LOC  12/13/2018  . IR KYPHO LUMBAR INC FX REDUCE BONE BX UNI/BIL CANNULATION INC/IMAGING  12/13/2018  . IR RADIOLOGY PERIPHERAL GUIDED IV START  12/13/2018  . IR US GUIDE VASC ACCESS LEFT  12/13/2018  . MASS BIOPSY  11/01/2012   Procedure: NECK MASS BIOPSY;  Surgeon: Ascencion Dike, MD;  Location: [facility] ORS;  Service: ENT;  Laterality: Right;  Excisional Bx Right Neck Mass; attempted external jugular cutdown of left side  . PAROTIDECTOMY  11/24/2012   Procedure: PAROTIDECTOMY;  Surgeon: Evelena Peat  Cassie Freer, MD;  Location: [place];  Service: ENT;  Laterality: N/A;  Total parotidectomy  . PLEURECTOMY    . right lymph node removal Right    behind right ear  . Right video-assisted thoracic surgery, pleurectomy, and pleurodesis  2011  . VIDEO BRONCHOSCOPY WITH ENDOBRONCHIAL NAVIGATION N/A 04/26/2018   Procedure: VIDEO BRONCHOSCOPY WITH ENDOBRONCHIAL NAVIGATION;  Surgeon: Melrose Nakayama, MD;  Location: [place];  Service: Thoracic;  Laterality: N/A;   Social History   Socioeconomic History  . Marital status: Married    Spouse name: Not on file  . Number of children: 1  . Years of education: Not on file  . Highest education level: Not on  file  Occupational History  . Occupation: disabled  Tobacco Use  . Smoking status: Former Smoker    Packs/day: 3.00    Years: 25.00    Pack years: 75.00    Types: Cigarettes    Quit date: 03/01/1997    Years since quitting: 22.9  . Smokeless tobacco: Never Used  Substance and Sexual Activity  . Alcohol use: No  . Drug use: No  . Sexual activity: Not Currently  Other Topics Concern  . Not on file  Social History Narrative  . Not on file   Social Determinants of Health   Financial Resource Strain:   . Difficulty of Paying Living Expenses: Not on file  Food Insecurity:   . Worried About Charity fundraiser in the Last Year: Not on file  . Ran Out of Food in the Last Year: Not on file  Transportation Needs:   . Lack of Transportation (Medical): Not on file  . Lack of Transportation (Non-Medical): Not on file  Physical Activity:   . Days of Exercise per Week: Not on file  . Minutes of Exercise per Session: Not on file  Stress:   . Feeling of Stress : Not on file  Social Connections:   . Frequency of Communication with Friends and Family: Not on file  . Frequency of Social Gatherings with Friends and Family: Not on file  . Attends Religious Services: Not on file  . Active Member of Clubs or Organizations: Not on file  . Attends Archivist Meetings: Not on file  . Marital Status: Not on file   Outpatient Encounter Medications as of 02/13/2020  Medication Sig  . allopurinol (ZYLOPRIM) 300 MG tablet Take 1 tablet (300 mg total) by mouth daily.  . AMITIZA 24 MCG capsule TAKE 1 CAPSULE(24 MCG) BY MOUTH TWICE DAILY WITH A MEAL  . amLODipine (NORVASC) 10 MG tablet TAKE 1 TABLET(10 MG) BY MOUTH DAILY  . amoxicillin-clavulanate (AUGMENTIN) 875-125 MG tablet Take 1 tablet by mouth 2 (two) times daily.  . Cholecalciferol (VITAMIN D3) 125 MCG (5000 UT) CAPS Take 1 capsule (5,000 Units total) by mouth daily. (Patient taking differently: Take 5,000 Units by mouth every morning. )   . cloNIDine (CATAPRES) 0.2 MG tablet Take 1 tablet (0.2 mg total) by mouth 3 (three) times daily.  . diclofenac sodium (VOLTAREN) 1 % GEL Apply twice a day for affected area on ankle as needed  . furosemide (LASIX) 40 MG tablet Take 1 tablet (40 mg total) by mouth daily.  [name] gabapentin (NEURONTIN) 300 MG capsule TAKE 2 CAPSULES BY MOUTH TWICE DAILY, MAY TAKE AN ADDTIONAL 2 CAPSULES MIDDAY AS NEEDED FOR PAIN  . insulin glargine (LANTUS SOLOSTAR) 100 UNIT/ML Solostar Pen Inject 42 Units into the skin at bedtime.  [name] lisinopril (ZESTRIL)  20 MG tablet Take 1 tablet (20 mg total) by mouth daily.  . metFORMIN (GLUCOPHAGE) 500 MG tablet TAKE 1 TABLET BY MOUTH TWICE DAILY  . metoprolol succinate (TOPROL-XL) 50 MG 24 hr tablet TAKE 1 TABLET BY MOUTH EVERY DAY WITH OR IMMEDIATELY FOLLOWING A MEAL  . nystatin (MYCOSTATIN/NYSTOP) powder Apply topically 3 (three) times daily.  [name] VERIO test strip USE TO CHECK BLOOD SUGAR FOUR TIMES DAILY  . oxyCODONE-acetaminophen (PERCOCET) 7.5-325 MG tablet Take 1 tablet by mouth every 4 (four) hours as needed.  . pantoprazole (PROTONIX) 40 MG tablet Take 1 tablet (40 mg total) by mouth daily.  . potassium chloride SA (KLOR-CON) 20 MEQ tablet TAKE 1 TABLET(20 MEQ) BY MOUTH DAILY  . simvastatin (ZOCOR) 20 MG tablet Take 1 tablet (20 mg total) by mouth at bedtime.  . tamsulosin (FLOMAX) 0.4 MG CAPS capsule Take 1 capsule (0.4 mg total) by mouth daily.  [name] tiZANidine (ZANAFLEX) 2 MG tablet TAKE 1 TABLET(2 MG) BY MOUTH TWICE DAILY AS NEEDED  . Triamcinolone Acetonide (TRIAMCINOLONE 0.1 % CREAM : EUCERIN) CREA Apply 1 application topically 2 (two) times daily as needed. (Patient taking differently: Apply 1 application topically 2 (two) times daily as needed for rash. )  . [DISCONTINUED] Insulin Glargine (LANTUS SOLOSTAR) 100 UNIT/ML Solostar Pen Inject 30 Units into the skin at bedtime.   No facility-administered encounter medications on file as of 02/13/2020.    ALLERGIES: No Known Allergies VACCINATION STATUS: Immunization History  Administered Date(s) Administered  . Fluad Quad(high Dose 65+) 09/06/2019  . Influenza Split 08/24/2012  . Influenza, High Dose Seasonal PF 10/31/2017, 10/02/2018  . Influenza,inj,Quad PF,6+ Mos 10/09/2013, 09/05/2014, 11/23/2015, 08/16/2016  . Moderna SARS-COVID-2 Vaccination 01/19/2020  . Pneumococcal Conjugate-13 01/07/2014  . Pneumococcal Polysaccharide-23 11/25/2012  . Tdap 09/18/2019    Diabetes He presents for his follow-up diabetic visit. He has type 2 diabetes mellitus. Onset time: diagnosed approx at age 37. His disease course has been worsening. There are no hypoglycemic associated symptoms. Pertinent negatives for hypoglycemia include no confusion, pallor or seizures. Pertinent negatives for diabetes include no fatigue, no polydipsia, no polyphagia, no polyuria and no weakness. Symptoms are worsening. Risk factors for coronary artery disease include diabetes mellitus, dyslipidemia, hypertension, male sex, obesity, sedentary lifestyle and tobacco exposure. Current diabetic treatment includes insulin injections. He is following a generally unhealthy diet. When asked about meal planning, he reported none. He has had a previous visit with a dietitian. He never participates in exercise. His home blood glucose trend is decreasing steadily. (His wife who is his caretaker reports his morning blood glucose readings range between 108-154, point-of-care A1c today is 7.7%, increasing from 6.1%.) An ACE inhibitor/angiotensin II receptor blocker is being taken.  Hyperlipidemia This is a chronic problem. The current episode started more than 1 year ago. The problem is uncontrolled. Recent lipid tests were reviewed and are variable. Exacerbating diseases include diabetes and obesity. Pertinent negatives include no myalgias. Risk factors for coronary artery disease include diabetes mellitus, dyslipidemia, hypertension, male sex  and a sedentary lifestyle.    Review of systems: Limited as above.  Objective:    CMP  CMP Latest Ref Rng & Units 10/08/2019 08/01/2019 07/15/2019  Glucose 65 - 99 mg/dL 65 81 111(H)  BUN 7 - 25 mg/dL 25 29(H) 24(H)  Creatinine 0.70 - 1.18 mg/dL 1.42(H) 1.68(H) 1.74(H)  Sodium 135 - 146 mmol/L 140 135 137  Potassium 3.5 - 5.3 mmol/L 4.5 4.6 4.9  Chloride 98 - 110 mmol/L 106  104 102  CO2 20 - 32 mmol/L 27 23 24   Calcium 8.6 - 10.3 mg/dL 10.3 10.0 10.8(H)  Total Protein 6.1 - 8.1 g/dL 6.4 - 8.3(H)  Total Bilirubin 0.2 - 1.2 mg/dL 0.3 - 0.6  Alkaline Phos 38 - 126 U/L - - 84  AST 10 - 35 U/L 10 - 17  ALT 9 - 46 U/L 9 - 11    Diabetic Labs (most recent): Lab Results  Component Value Date   HGBA1C 6.1 (H) 10/08/2019   HGBA1C 6.3 (H) 06/06/2019   HGBA1C 6.4 (H) 02/28/2019   Lipid Panel     Component Value Date/Time   CHOL 151 11/01/2017 0828   TRIG 219 (H) 11/01/2017 0828   HDL 56 11/01/2017 0828   CHOLHDL 2.7 11/01/2017 0828   VLDL 66 (H) 05/04/2017 0931   LDLCALC 66 11/01/2017 0828     Assessment & Plan:   1. Type 2 diabetes mellitus with stage 2 chronic kidney disease and CVA -This is a telephone visit due to the coronavirus pandemic  -His fasting glycemic profile slightly above target.  Point-of-care A1c 7.7% increasing from 6.1%.    -  His diabetes is complicated by recurrent CVA, stage 2-3 renal insufficiency, and he remains at extremely high risk for more acute and chronic complications of diabetes which include CAD, CVA, CKD, retinopathy, and neuropathy. -In May 2019, he was diagnosed with non-small cell lung cancer , status post multiple rounds of radiation treatment completed on May 30, 2018.    -As usual, he is assisted by his wife who is also his caretaker and advised to increase his Lantus to 42 units nightly,   associated with strict monitoring of blood glucose 2 times a day-daily before breakfast and at bedtime.  He will not need prandial insulin for now.   He is advised to continue metformin 500 mg p.o. once daily-daily after breakfast , to be advanced if necessary. -Patient is not a candidate for SGLT2 inhibitors due to CKD, not a suitable candidate for incretin therapy.   2) hypertension: -His blood pressure is controlled to target.  He is currently on metoprolol 50 mg daily, lisinopril 20 mg daily.    3) Lipids/HPL: Recent lipid panel showed controlled LDL at 66.  He is advised to continue simvastatin 20 mg p.o. nightly.  Side effects and precautions discussed with him.     4) hypothyroidism:  -His recent labs on thyroid function tests are consistent with appropriate replacement.  He is advised to continue    levothyroxine 88 g by mouth every morning.   - We discussed about the correct intake of his thyroid hormone, on empty stomach at fasting, with water, separated by at least 30 minutes from breakfast and other medications,  and separated by more than 4 hours from calcium, iron, multivitamins, acid reflux medications (PPIs). -Patient is made aware of the fact that thyroid hormone replacement is needed for life, dose to be adjusted by periodic monitoring of thyroid function tests.   4) vitamin D deficiency -He is advised to continue vitamin D3 5000 units daily for the next 90 days.    - I advised patient to maintain close follow up with Dr. Buelah Manis for primary care needs.  - Time spent on this patient care encounter:  35 min, of which > 50% was spent in  counseling and the rest reviewing his blood glucose logs , discussing his hypoglycemia and hyperglycemia episodes, reviewing his current and  previous labs / studies  (  including abstraction from other facilities) and medications  doses and developing a  long term treatment plan and documenting his care.   Please refer to Patient Instructions for Blood Glucose Monitoring and Insulin/Medications Dosing Guide"  in media tab for additional information. Please  also refer to " Patient Self  Inventory" in the Media  tab for reviewed elements of pertinent patient history.  [name] participated in the discussions, expressed understanding, and voiced agreement with the above plans.  All questions were answered to his satisfaction. he is encouraged to contact clinic should he have any questions or concerns prior to his return visit.    Follow up plan: Return in about 4 months (around 06/14/2020) for Bring Meter and Logs- A1c in Office, Follow up with Pre-visit Labs.  Glade Lloyd, MD Phone: [phone]  Fax: [phone]  This note was partially dictated with voice recognition software. Similar sounding words can be transcribed inadequately or may not  be corrected upon review.  02/13/2020, 3:32 PM

## 2020-02-13 NOTE — Patient Instructions (Signed)

## 2020-02-14 ENCOUNTER — Telehealth: Payer: Self-pay | Admitting: *Deleted

## 2020-02-14 MED ORDER — DOXYCYCLINE HYCLATE 100 MG PO CAPS: 100.0000 mg | ORAL_CAPSULE | Freq: Two times a day (BID) | ORAL | 0 refills | Status: DC

## 2020-02-14 NOTE — Telephone Encounter (Signed)
Received call from patient wife, [name].   Reports that patient has abscess to back.   Requested ABTx.   MD please advise.

## 2020-02-14 NOTE — Telephone Encounter (Signed)
Call placed to patient and patient wife [name] made aware.   Prescription sent to pharmacy.

## 2020-02-14 NOTE — Telephone Encounter (Signed)
Send doxycycline 148m BID x 7 days

## 2020-02-19 ENCOUNTER — Other Ambulatory Visit: Payer: Self-pay

## 2020-02-19 ENCOUNTER — Ambulatory Visit: Payer: Medicare Other | Attending: Internal Medicine

## 2020-02-19 DIAGNOSIS — Z23 Encounter for immunization: Secondary | ICD-10-CM

## 2020-02-19 NOTE — Progress Notes (Signed)
   Covid-19 Vaccination Clinic  Name:  [name]    MRN: [id number] DOB: [birth year]  02/19/2020  [name] was observed post Covid-19 immunization for 15 minutes without incident. He was provided with Vaccine Information Sheet and instruction to access the V-Safe system.   [name] was instructed to call 911 with any severe reactions post vaccine: [name] Difficulty breathing  . Swelling of face and throat  . A fast heartbeat  . A bad rash all over body  . Dizziness and weakness   Immunizations Administered    Name Date Dose VIS Date Route   Moderna COVID-19 Vaccine 02/19/2020  1:49 PM 0.5 mL 11/12/2019 Intramuscular   Manufacturer: Moderna   Lot: [id number]   [place]: [phone]

## 2020-02-27 ENCOUNTER — Other Ambulatory Visit: Payer: Self-pay | Admitting: "Endocrinology

## 2020-02-27 ENCOUNTER — Other Ambulatory Visit: Payer: Self-pay | Admitting: Gastroenterology

## 2020-02-29 IMAGING — CT CT ABDOMEN AND PELVIS WITHOUT CONTRAST
2 of 4 series · 17 of 46 positions shown, 19 images · non-contrast
Comparison: CT 06/27/2019, 05/23/2016, 12/01/2018

CLINICAL DATA: Abdominal pain with fever

EXAM:
CT ABDOMEN AND PELVIS WITHOUT CONTRAST
TECHNIQUE: Multidetector CT imaging of the abdomen and pelvis was performed
following the standard protocol without IV contrast.

[Series 2: axial st · axial · 0.97mm/px · z∈[-434,-28]mm · 14 of 93 slices shown, 16 images]
[im 6/93  soft-tissue]
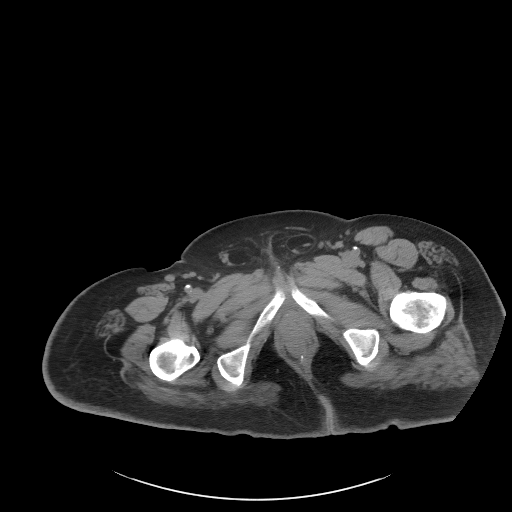
[im 6/93  bone]
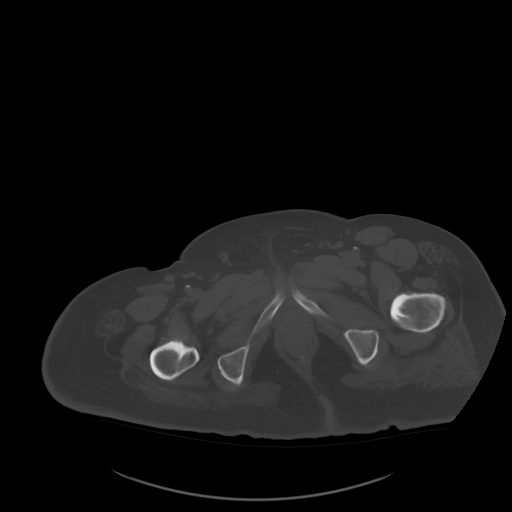
[im 11/93  soft-tissue]
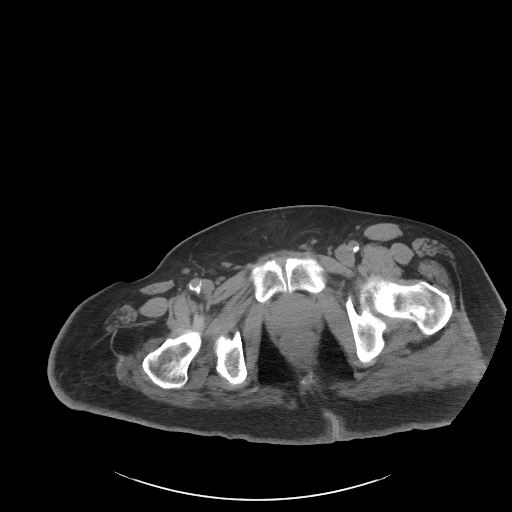
[im 17/93  soft-tissue]
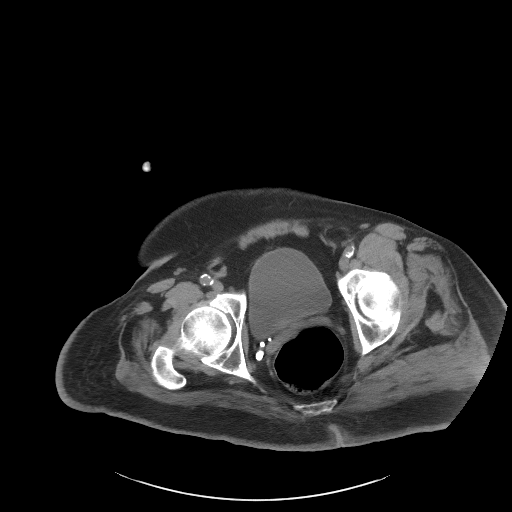
[im 28/93  soft-tissue]
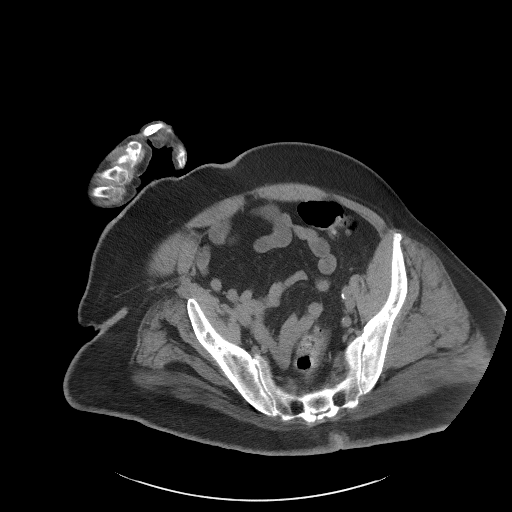
[im 33/93  soft-tissue]
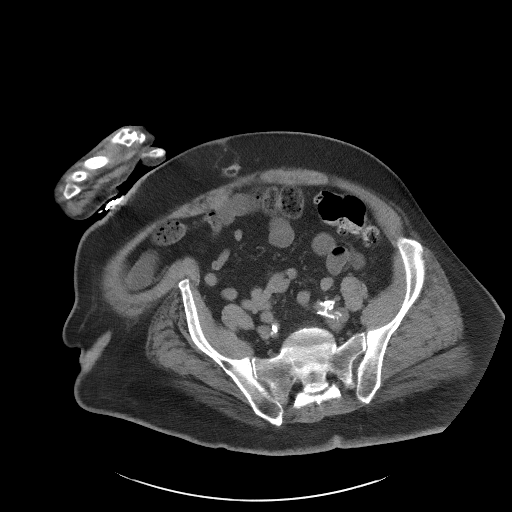
[im 38/93  soft-tissue]
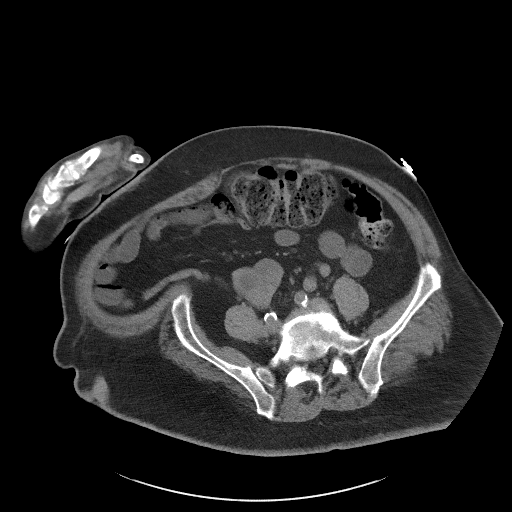
[im 44/93  soft-tissue]
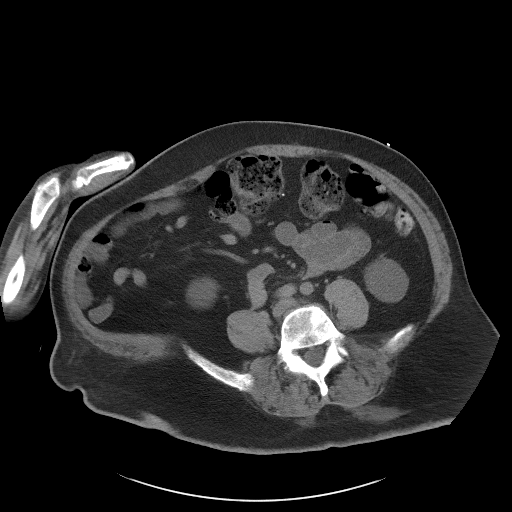
[im 49/93  soft-tissue]
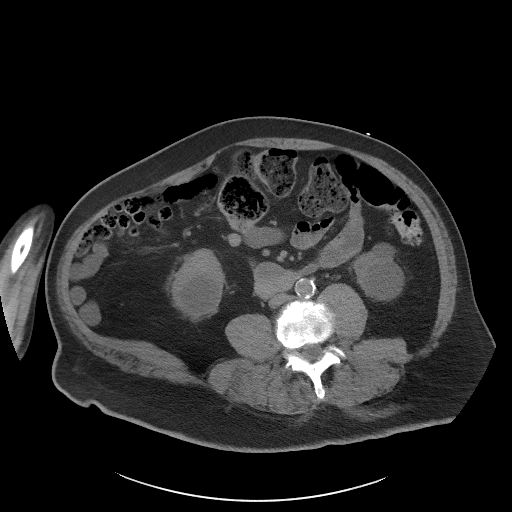
[im 55/93  soft-tissue]
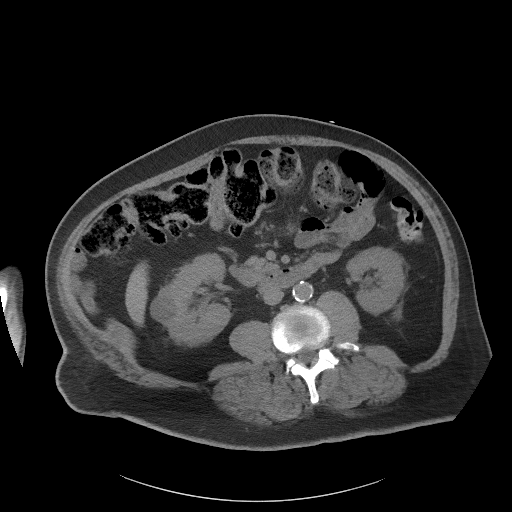
[im 55/93  bone]
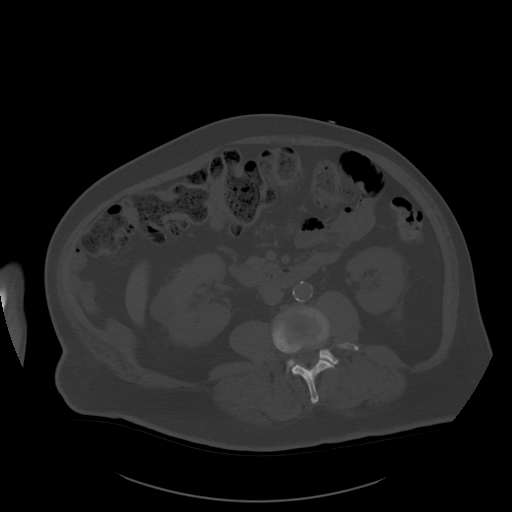
[im 60/93  soft-tissue]
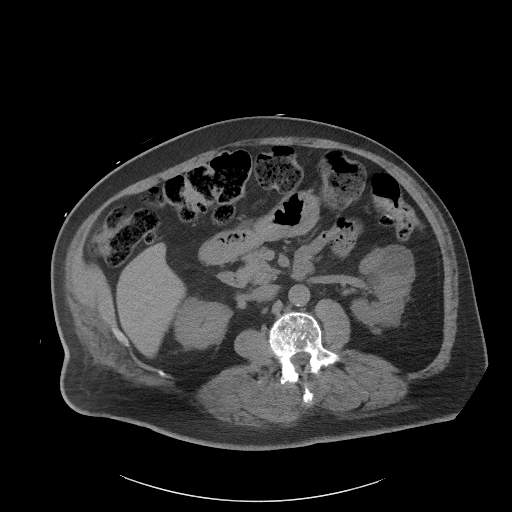
[im 71/93  soft-tissue]
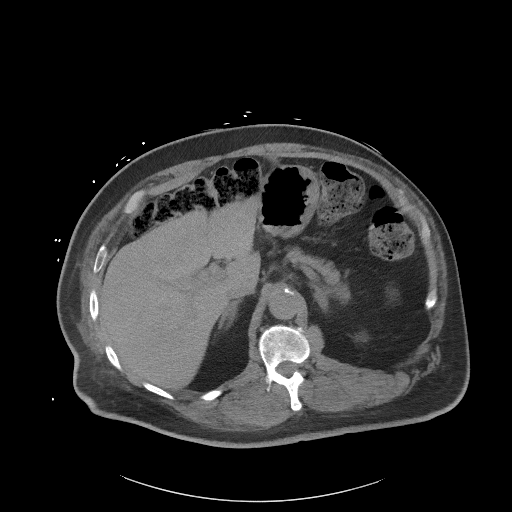
[im 76/93  soft-tissue]
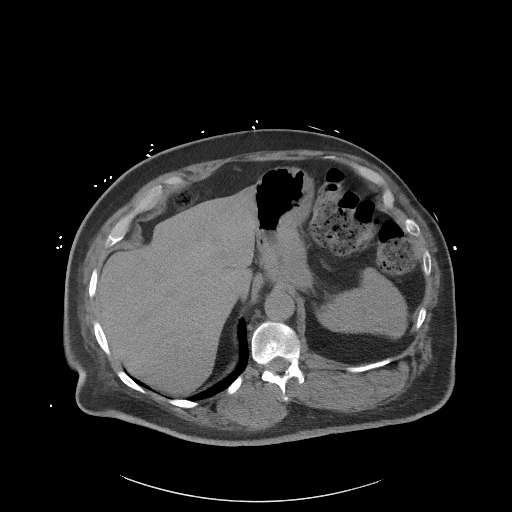
[im 82/93  soft-tissue]
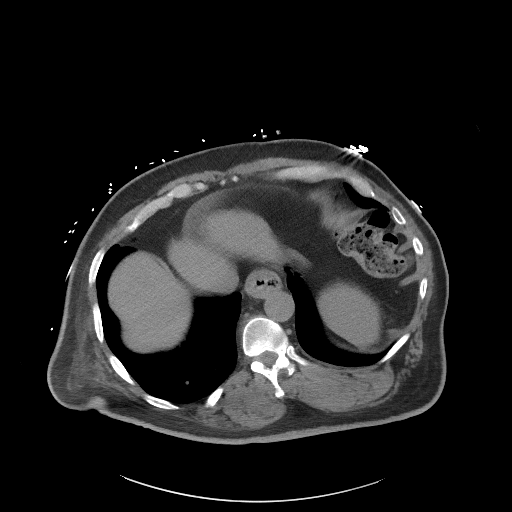
[im 87/93  soft-tissue]
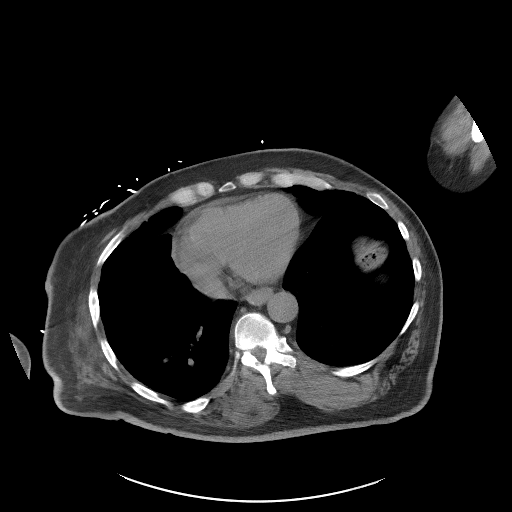

[Series 5: coronal st · coronal · 0.90mm/px · 3 of 116 slices shown]
[im 39/116  soft-tissue]
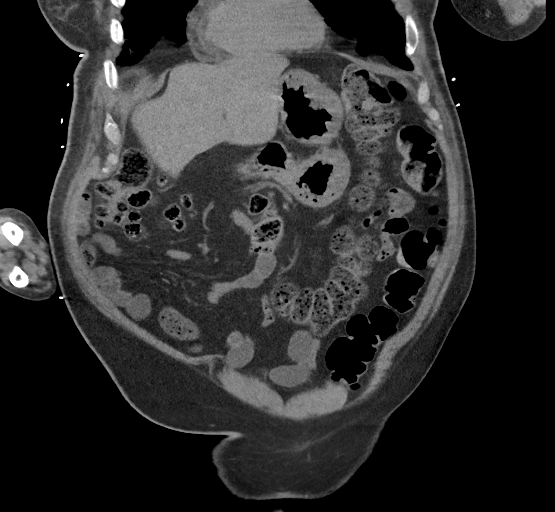
[im 52/116  soft-tissue]
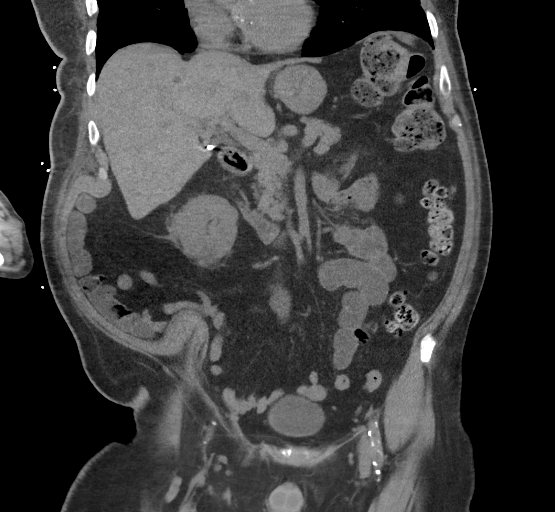
[im 64/116  soft-tissue]
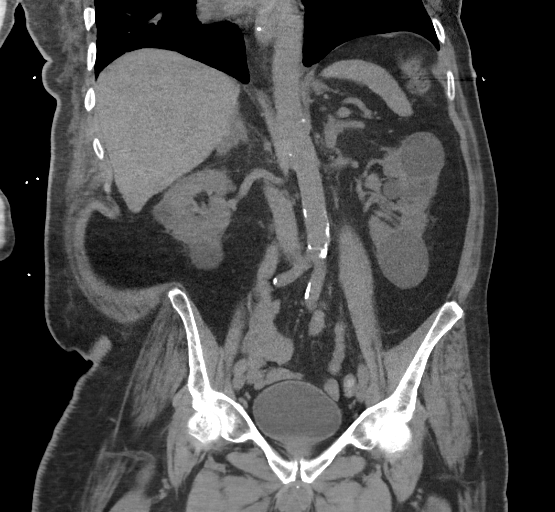

[17 of 46 positions shown; findings below may reference images not displayed]

FINDINGS: Lower chest: Lung bases demonstrate no acute consolidation or
effusion. Mild bronchiectasis in the right lower lobe. Heart size
within normal limits. Trace pericardial effusion.

Hepatobiliary: Calcified granuloma. Status post cholecystectomy. No
biliary dilatation

Pancreas: Unremarkable. No pancreatic ductal dilatation or
surrounding inflammatory changes.

Spleen: Normal in size without focal abnormality.

Adrenals/Urinary Tract: Adrenal glands are stable. Mild nodularity
and thickening of left adrenal gland as before. No hydronephrosis.
Multiple cysts within the bilateral kidneys. The bladder is
unremarkable

Stomach/Bowel: The stomach is nonenlarged. No dilated small bowel.
Diffuse diverticular disease of the colon without acute inflammatory
change. Negative appendix.

Vascular/Lymphatic: Nonaneurysmal aorta. Moderate aortic
atherosclerosis. No significantly enlarged lymph nodes.

Reproductive: Enlarged prostate with mild mass effect on the bladder

Other: Negative for free air or free fluid. Small fat containing
periumbilical hernia

Musculoskeletal: Treated compression deformity L1. Diffuse lytic
changes L2, no change.
IMPRESSION: 1. No CT evidence for acute intra-abdominal or pelvic abnormality.
Negative for acute appendicitis.
2. Bilateral renal cysts
3. Colon diverticular disease without acute inflammatory changes.

## 2020-03-02 ENCOUNTER — Encounter (HOSPITAL_COMMUNITY): Payer: Self-pay | Admitting: Emergency Medicine

## 2020-03-02 ENCOUNTER — Inpatient Hospital Stay (HOSPITAL_COMMUNITY)
Admission: EM | Admit: 2020-03-02 | Discharge: 2020-03-06 | DRG: 393 | Disposition: A | Discharge status: Home / Self Care | Payer: Medicare Other | Attending: Internal Medicine | Admitting: Internal Medicine

## 2020-03-02 ENCOUNTER — Other Ambulatory Visit: Payer: Self-pay

## 2020-03-02 DIAGNOSIS — K219 Gastro-esophageal reflux disease without esophagitis: Secondary | ICD-10-CM | POA: Diagnosis not present

## 2020-03-02 DIAGNOSIS — Z85818 Personal history of malignant neoplasm of other sites of lip, oral cavity, and pharynx: Secondary | ICD-10-CM | POA: Diagnosis not present

## 2020-03-02 DIAGNOSIS — K2270 Barrett's esophagus without dysplasia: Secondary | ICD-10-CM | POA: Diagnosis not present

## 2020-03-02 DIAGNOSIS — D631 Anemia in chronic kidney disease: Secondary | ICD-10-CM | POA: Diagnosis not present

## 2020-03-02 DIAGNOSIS — G8929 Other chronic pain: Secondary | ICD-10-CM | POA: Diagnosis not present

## 2020-03-02 DIAGNOSIS — E785 Hyperlipidemia, unspecified: Secondary | ICD-10-CM

## 2020-03-02 DIAGNOSIS — Z85118 Personal history of other malignant neoplasm of bronchus and lung: Secondary | ICD-10-CM | POA: Diagnosis not present

## 2020-03-02 DIAGNOSIS — R000 Tachycardia, unspecified: Secondary | ICD-10-CM | POA: Diagnosis not present

## 2020-03-02 DIAGNOSIS — E1143 Type 2 diabetes mellitus with diabetic autonomic (poly)neuropathy: Secondary | ICD-10-CM | POA: Diagnosis not present

## 2020-03-02 DIAGNOSIS — Z8249 Family history of ischemic heart disease and other diseases of the circulatory system: Secondary | ICD-10-CM

## 2020-03-02 DIAGNOSIS — K922 Gastrointestinal hemorrhage, unspecified: Secondary | ICD-10-CM

## 2020-03-02 DIAGNOSIS — N179 Acute kidney failure, unspecified: Secondary | ICD-10-CM | POA: Diagnosis not present

## 2020-03-02 DIAGNOSIS — K559 Vascular disorder of intestine, unspecified: Principal | ICD-10-CM | POA: Diagnosis present

## 2020-03-02 DIAGNOSIS — Z87891 Personal history of nicotine dependence: Secondary | ICD-10-CM

## 2020-03-02 DIAGNOSIS — E782 Mixed hyperlipidemia: Secondary | ICD-10-CM | POA: Diagnosis not present

## 2020-03-02 DIAGNOSIS — I69322 Dysarthria following cerebral infarction: Secondary | ICD-10-CM | POA: Diagnosis not present

## 2020-03-02 DIAGNOSIS — L8990 Pressure ulcer of unspecified site, unspecified stage: Secondary | ICD-10-CM | POA: Insufficient documentation

## 2020-03-02 DIAGNOSIS — D649 Anemia, unspecified: Secondary | ICD-10-CM

## 2020-03-02 DIAGNOSIS — Z79899 Other long term (current) drug therapy: Secondary | ICD-10-CM

## 2020-03-02 DIAGNOSIS — I129 Hypertensive chronic kidney disease with stage 1 through stage 4 chronic kidney disease, or unspecified chronic kidney disease: Secondary | ICD-10-CM | POA: Diagnosis not present

## 2020-03-02 DIAGNOSIS — K5909 Other constipation: Secondary | ICD-10-CM | POA: Diagnosis present

## 2020-03-02 DIAGNOSIS — E1122 Type 2 diabetes mellitus with diabetic chronic kidney disease: Secondary | ICD-10-CM | POA: Diagnosis present

## 2020-03-02 DIAGNOSIS — K5730 Diverticulosis of large intestine without perforation or abscess without bleeding: Secondary | ICD-10-CM | POA: Diagnosis not present

## 2020-03-02 DIAGNOSIS — I10 Essential (primary) hypertension: Secondary | ICD-10-CM | POA: Diagnosis not present

## 2020-03-02 DIAGNOSIS — Z743 Need for continuous supervision: Secondary | ICD-10-CM | POA: Diagnosis not present

## 2020-03-02 DIAGNOSIS — D509 Iron deficiency anemia, unspecified: Secondary | ICD-10-CM | POA: Diagnosis not present

## 2020-03-02 DIAGNOSIS — L89312 Pressure ulcer of right buttock, stage 2: Secondary | ICD-10-CM | POA: Diagnosis present

## 2020-03-02 DIAGNOSIS — Z8572 Personal history of non-Hodgkin lymphomas: Secondary | ICD-10-CM

## 2020-03-02 DIAGNOSIS — N1831 Chronic kidney disease, stage 3a: Secondary | ICD-10-CM | POA: Diagnosis present

## 2020-03-02 DIAGNOSIS — N281 Cyst of kidney, acquired: Secondary | ICD-10-CM | POA: Diagnosis not present

## 2020-03-02 DIAGNOSIS — Z20822 Contact with and (suspected) exposure to covid-19: Secondary | ICD-10-CM | POA: Diagnosis present

## 2020-03-02 DIAGNOSIS — Z791 Long term (current) use of non-steroidal anti-inflammatories (NSAID): Secondary | ICD-10-CM | POA: Diagnosis not present

## 2020-03-02 DIAGNOSIS — N400 Enlarged prostate without lower urinary tract symptoms: Secondary | ICD-10-CM | POA: Diagnosis present

## 2020-03-02 DIAGNOSIS — I69351 Hemiplegia and hemiparesis following cerebral infarction affecting right dominant side: Secondary | ICD-10-CM

## 2020-03-02 DIAGNOSIS — Z923 Personal history of irradiation: Secondary | ICD-10-CM

## 2020-03-02 DIAGNOSIS — R1011 Right upper quadrant pain: Secondary | ICD-10-CM | POA: Diagnosis not present

## 2020-03-02 DIAGNOSIS — K5791 Diverticulosis of intestine, part unspecified, without perforation or abscess with bleeding: Secondary | ICD-10-CM | POA: Diagnosis not present

## 2020-03-02 DIAGNOSIS — R1084 Generalized abdominal pain: Secondary | ICD-10-CM | POA: Diagnosis not present

## 2020-03-02 DIAGNOSIS — N183 Chronic kidney disease, stage 3 unspecified: Secondary | ICD-10-CM | POA: Diagnosis present

## 2020-03-02 MED ORDER — SODIUM CHLORIDE 0.9 % IV BOLUS
500.0000 mL | Freq: Once | INTRAVENOUS | Status: AC
  Administered 2020-03-03: 500 mL via INTRAVENOUS

## 2020-03-02 NOTE — ED Provider Notes (Signed)
[facility] EMERGENCY DEPARTMENT Provider Note   CSN: [id number] Arrival date & time: 03/02/20  2342     History Chief Complaint  Patient presents with  . Rectal Bleeding    [name] is a 74 y.o. male.  Patient presents to the emergency department for GI bleed.  Patient has not been feeling well all day.  He has had abdominal pain and distention with nausea and vomiting.  He started to have diarrhea approximately 2 hours ago.  Initially it was diarrhea mixed with some blood, now putting out gross blood and clots.        Past Medical History:  Diagnosis Date  . Arthritis   . Asthma    "hx of"  . Barrett's esophagus    EGD 03/23/2011 & EGD 2/09 bx proven  . BPH (benign prostatic hyperplasia)   . Cancer of parotid gland ([place]) 11/23/12   Adenocarcinoma  . Chronic abdominal pain   . Chronic constipation   . Colon polyp 03/23/2011   tubular adenoma, Dr. Gala Romney  . 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 ([place]) 07/15/2011  . CVA (cerebral infarction) 1998   right sided deficit  . Delayed gastric emptying 2018  . Diverticulosis    TCS 03/23/11 pancolonic diverticula &TCS 5/08, pancolonic diverticula  . DM (diabetes mellitus) ([place])   . Edema of lower extremity 12/21/12   bilateral   . Esotropia of left eye   . GERD (gastroesophageal reflux disease)   . Glaucoma (increased eye pressure)   . Gout   . Gout   . Hemorrhagic colitis 06/06/2012.  [name] Hemorrhoids, internal 03/23/2011   tcs by Dr. Gala Romney  . Hepatitis    esosiniphilic, tx with prednisone  . Hiatal hernia   . History of radiation therapy 05/21/18- 05/30/18   Left Lung/ 54 Gy delivered in 3 fractions of 18 Gy. SBRT  . HTN (hypertension)   . Hx of radiation therapy 1974   right base of skull area-lymphoma  . Hyperlipidemia   . Lower facial weakness    Right  . Lymphoma ([place]) 1974   XRT at [facility], right base of skull area  . Neuropathy   . Non-small cell lung cancer ([place]) dx'd 04/26/18  . Peripheral edema     R>L legs  . Rash    chronic, recurrent, R>L legs  . Renal insufficiency   . Steatohepatitis    liver biopsy 2009  . Stroke [facility]) 1998   right hemiparesis/plegia    Patient Active Problem List   Diagnosis Date Noted  . Acute lower GI bleeding 03/03/2020  . Thoracic aortic aneurysm without rupture ([place]) 11/19/2019  . Aortic atherosclerosis ([place]) 11/19/2019  . Protein-calorie malnutrition ([place]) 06/10/2019  . Vitamin D deficiency 03/07/2019  . Hypogonadism, male 07/26/2018  . Gynecomastia, male 06/27/2018  . Malignant neoplasm of upper lobe of left lung ([place]) 04/06/2018  . History of parotid cancer 02/07/2018  . RUQ pain 11/09/2016  . Colon adenomas 11/09/2016  . Primary hypothyroidism 09/09/2015  . Chronic constipation 05/22/2015  . Candidiasis, intertrigo 09/05/2014  . Arthritis, shoulder region 12/10/2013  . Impetigo bullosa 10/23/2013  . Intertrigo 10/12/2013  . Chronic left shoulder pain 09/25/2013  . Muscle weakness (generalized) 09/25/2013  . Hypercalcemia 08/08/2013  . CKD (chronic kidney disease), stage III 08/07/2013  . Adhesive capsulitis of left shoulder 05/14/2013  . Steatohepatitis   . Diverticulosis   . GERD (gastroesophageal reflux disease)   . History of CVA (cerebrovascular accident)   .  Hiatal hernia   . Mixed hyperlipidemia   . Glaucoma (increased eye pressure)   . Esotropia of left eye   . BPH (benign prostatic hyperplasia)   . Hx of radiation therapy   . 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 Epidermal inclusion cyst 10/05/2012  . Neck pain 08/26/2012  . Chronic abdominal pain 08/26/2012  . Leg edema 08/24/2012  . Hemiplegia of dominant side as late effect following cerebrovascular disease ([place]) 06/06/2012  . Diarrhea 04/17/2012  . Obesity 04/17/2012  . Diabetic neuropathy ([place]) 02/13/2012  . Recurrent boils 01/12/2012  . 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 ([place]) 07/15/2011  . Hemorrhoids, internal 03/23/2011  .  Hepatitis 03/02/2011  . EOSINOPHILIA 12/23/2009  . Type 2 diabetes mellitus with stage 2 chronic kidney disease, with long-term current use of insulin ([place]) 01/05/2009  . Gout, unspecified 01/05/2009  . Essential hypertension 01/05/2009  . BARRETTS ESOPHAGUS 01/05/2009  . DIVERTICULOSIS OF COLON 01/05/2009  . History of lymphoma 01/05/2009    Past Surgical History:  Procedure Laterality Date  . BIOPSY  12/01/2016   Procedure: BIOPSY;  Surgeon: Daneil Dolin, MD;  Location: [facility] ENDO SUITE;  Service: Endoscopy;;  duodenum, gastric, esophagus  . CHOLECYSTECTOMY    . COLONOSCOPY  03/23/11   Dr. Gala Romney  pancolonic diverticula, hemorrhoids, tubular adenoma.. next tcs 03/2016  . COLONOSCOPY WITH PROPOFOL N/A 12/01/2016   inadequate bowel prep precluded exam  . ESOPHAGOGASTRODUODENOSCOPY  02/05/08   goblet cell metaplasia/negative for H.pylori  . ESOPHAGOGASTRODUODENOSCOPY  03/23/11   Dr. Gala Romney, barretts, hiatal hernia  . ESOPHAGOGASTRODUODENOSCOPY (EGD) WITH PROPOFOL N/A 12/01/2016   Dr. Gala Romney: Large amount of retained gastric contents precluded completion of the stomach. Mucosal changes were found in the stomach. Erosions and somewhat scalloped appearing mucosa present, reactive gastritis/no H pylori. Barrett's esophagus noted, no dysplasia on biopsy. Duodenal biopsies taken as well, benign, no evidence of eosinophilia.  . IR FLUORO GUIDED NEEDLE PLC ASPIRATION/INJECTION LOC  12/13/2018  . IR KYPHO LUMBAR INC FX REDUCE BONE BX UNI/BIL CANNULATION INC/IMAGING  12/13/2018  . IR RADIOLOGY PERIPHERAL GUIDED IV START  12/13/2018  . IR US GUIDE VASC ACCESS LEFT  12/13/2018  . MASS BIOPSY  11/01/2012   Procedure: NECK MASS BIOPSY;  Surgeon: Ascencion Dike, MD;  Location: [facility] ORS;  Service: ENT;  Laterality: Right;  Excisional Bx Right Neck Mass; attempted external jugular cutdown of left side  . PAROTIDECTOMY  11/24/2012   Procedure: PAROTIDECTOMY;  Surgeon: Ascencion Dike, MD;  Location: [facility];  Service: ENT;   Laterality: N/A;  Total parotidectomy  . PLEURECTOMY    . right lymph node removal Right    behind right ear  . Right video-assisted thoracic surgery, pleurectomy, and pleurodesis  2011  . VIDEO BRONCHOSCOPY WITH ENDOBRONCHIAL NAVIGATION N/A 04/26/2018   Procedure: VIDEO BRONCHOSCOPY WITH ENDOBRONCHIAL NAVIGATION;  Surgeon: Melrose Nakayama, MD;  Location: [facility] OR;  Service: Thoracic;  Laterality: N/A;       Family History  Problem Relation Age of Onset  . Heart failure Mother   . Heart failure Father   . Heart failure Sister   . Heart failure Son     Social History   Tobacco Use  . Smoking status: Former Smoker    Packs/day: 3.00    Years: 25.00    Pack years: 75.00    Types: Cigarettes    Quit date: 03/01/1997    Years since quitting: 23.0  . Smokeless tobacco: Never Used  Substance Use Topics  . Alcohol use: No  . Drug use: No    Home Medications Prior to Admission medications   Medication Sig Start Date End Date Taking? Authorizing Provider  allopurinol (ZYLOPRIM) 300 MG tablet Take 1 tablet (300 mg total) by mouth daily. 11/19/19   [name], MD  AMITIZA 24 MCG capsule TAKE 1 CAPSULE(24 MCG) BY MOUTH TWICE DAILY WITH A MEAL 11/19/19   Aliene Altes S, PA-C  amLODipine (NORVASC) 10 MG tablet TAKE 1 TABLET(10 MG) BY MOUTH DAILY 11/19/19   [place], [name], MD  Cholecalciferol (VITAMIN D3) 125 MCG (5000 UT) CAPS Take 1 capsule (5,000 Units total) by mouth daily. Patient taking differently: Take 5,000 Units by mouth every morning.  03/08/19   [name], MD  cloNIDine (CATAPRES) 0.2 MG tablet Take 1 tablet (0.2 mg total) by mouth 3 (three) times daily. 11/19/19   [name], MD  diclofenac sodium (VOLTAREN) 1 % GEL Apply twice a day for affected area on ankle as needed 09/18/19   [name], MD  doxycycline (VIBRAMYCIN) 100 MG capsule Take 1 capsule (100 mg total) by mouth 2 (two) times daily. 02/14/20   [name], MD  furosemide (LASIX)  40 MG tablet Take 1 tablet (40 mg total) by mouth daily. 11/19/19   [place], [name], MD  gabapentin (NEURONTIN) 300 MG capsule TAKE 2 CAPSULES BY MOUTH TWICE DAILY, MAY TAKE AN ADDTIONAL 2 CAPSULES MIDDAY AS NEEDED FOR PAIN 02/10/20   [name], MD  insulin glargine (LANTUS SOLOSTAR) 100 UNIT/ML Solostar Pen Inject 42 Units into the skin at bedtime. 02/13/20   Cassandria Anger, MD  Lancets (ONETOUCH DELICA PLUS [id number]) MISC USE TO TEST BLOOD SUGAR FOUR TIMES DAILY 02/27/20   Cassandria Anger, MD  lisinopril (ZESTRIL) 20 MG tablet Take 1 tablet (20 mg total) by mouth daily. 11/19/19   [name], MD  metFORMIN (GLUCOPHAGE) 500 MG tablet TAKE 1 TABLET BY MOUTH TWICE DAILY 11/11/19   [place], Marella Chimes, MD  metoprolol succinate (TOPROL-XL) 50 MG 24 hr tablet TAKE 1 TABLET BY MOUTH EVERY DAY WITH OR IMMEDIATELY FOLLOWING A MEAL 12/09/19   [place], [name], MD  nystatin (MYCOSTATIN/NYSTOP) powder Apply topically 3 (three) times daily. 07/24/19   [name], MD  [facility] VERIO test strip USE TO CHECK BLOOD SUGAR FOUR TIMES DAILY 11/19/19   Cassandria Anger, MD  oxyCODONE-acetaminophen (PERCOCET) 7.5-325 MG tablet Take 1 tablet by mouth every 4 (four) hours as needed. 01/10/20   [name], MD  pantoprazole (PROTONIX) 40 MG tablet Take 1 tablet (40 mg total) by mouth daily. 11/19/19   [place], [name], MD  potassium chloride SA (KLOR-CON) 20 MEQ tablet TAKE 1 TABLET(20 MEQ) BY MOUTH DAILY 12/30/19   [name], MD  simvastatin (ZOCOR) 20 MG tablet Take 1 tablet (20 mg total) by mouth at bedtime. 11/19/19   [name], MD  tamsulosin (FLOMAX) 0.4 MG CAPS capsule Take 1 capsule (0.4 mg total) by mouth daily. 11/19/19   [place], [name], MD  tiZANidine (ZANAFLEX) 2 MG tablet TAKE 1 TABLET(2 MG) BY MOUTH TWICE DAILY AS NEEDED 08/28/19   [place], [name], MD  Triamcinolone Acetonide (TRIAMCINOLONE 0.1 % CREAM : EUCERIN) CREA Apply 1 application topically 2  (two) times daily as needed. Patient taking differently: Apply 1 application topically 2 (two) times daily as needed for rash.  02/06/18   [name], MD  Allergies    Patient has no known allergies.  Review of Systems   Review of Systems  Gastrointestinal: Positive for abdominal pain, anal bleeding, diarrhea, nausea and vomiting.  All other systems reviewed and are negative.   Physical Exam Updated Vital Signs BP (!) 131/92   Pulse 98   Temp 97.6 F (36.4 C) (Oral)   Resp 14   Ht 6' (1.829 m)   Wt 112.9 kg   SpO2 99%   BMI 33.77 kg/m   Physical Exam Vitals and nursing note reviewed.  Constitutional:      General: He is not in acute distress.    Appearance: Normal appearance. He is well-developed.  HENT:     Head: Normocephalic and atraumatic.     Right Ear: Hearing normal.     Left Ear: Hearing normal.     Nose: Nose normal.  Eyes:     Conjunctiva/sclera: Conjunctivae normal.     Pupils: Pupils are equal, round, and reactive to light.  Cardiovascular:     Rate and Rhythm: Regular rhythm.     Heart sounds: S1 normal and S2 normal. No murmur. No friction rub. No gallop.   Pulmonary:     Effort: Pulmonary effort is normal. No respiratory distress.     Breath sounds: Normal breath sounds.  Chest:     Chest wall: No tenderness.  Abdominal:     General: Bowel sounds are decreased. There is distension.     Palpations: Abdomen is soft.     Tenderness: There is generalized abdominal tenderness. There is no guarding or rebound. Negative signs include Murphy's sign and McBurney's sign.     Hernia: No hernia is present.     Comments: Voluminous amount of dark maroon blood oozing from anus  Musculoskeletal:        General: Normal range of motion.     Cervical back: Normal range of motion and neck supple.  Skin:    General: Skin is warm and dry.     Findings: No rash.  Neurological:     Mental Status: He is alert and oriented to person, place, and time.      GCS: GCS eye subscore is 4. GCS verbal subscore is 5. GCS motor subscore is 6.     Cranial Nerves: No cranial nerve deficit.     Comments: Right hemiparesis  Psychiatric:        Speech: Speech normal.        Behavior: Behavior normal.        Thought Content: Thought content normal.     ED Results / Procedures / Treatments   Labs (all labs ordered are listed, but only abnormal results are displayed) Labs Reviewed  CBC WITH DIFFERENTIAL/PLATELET - Abnormal; Notable for the following components:      Result Value   Hemoglobin 10.4 (*)    HCT 35.4 (*)    MCV 78.3 (*)    MCH 23.0 (*)    MCHC 29.4 (*)    RDW 17.9 (*)    Eosinophils Absolute 0.6 (*)    All other components within normal limits  COMPREHENSIVE METABOLIC PANEL - Abnormal; Notable for the following components:   Glucose, Bld 211 (*)    BUN 26 (*)    Creatinine, Ser 1.80 (*)    Albumin 3.3 (*)    AST 14 (*)    GFR calc non Af Amer 37 (*)    GFR calc Af Amer 42 (*)    All other components  within normal limits  LACTIC ACID, PLASMA - Abnormal; Notable for the following components:   Lactic Acid, Venous 2.9 (*)    All other components within normal limits  RESPIRATORY PANEL BY RT PCR (FLU A&B, COVID)  PROTIME-INR  HEMOGLOBIN AND HEMATOCRIT, BLOOD  BASIC METABOLIC PANEL  CBC  HEMOGLOBIN  HEMOGLOBIN  TYPE AND SCREEN    EKG None ED ECG REPORT   Date: 03/03/2020  Rate: 109  Rhythm: sinus tachycardia  QRS Axis: normal  Intervals: normal  ST/T Wave abnormalities: normal  Conduction Disutrbances:none  Narrative Interpretation:   Old EKG Reviewed: unchanged  I have personally reviewed the EKG tracing and agree with the computerized printout as noted.  Radiology CT ABDOMEN PELVIS WO CONTRAST  Result Date: 03/03/2020 CLINICAL DATA:  Abdominal pain and rectal bleeding EXAM: CT ABDOMEN AND PELVIS WITHOUT CONTRAST TECHNIQUE: Multidetector CT imaging of the abdomen and pelvis was performed following the standard  protocol without IV contrast. COMPARISON:  July 15, 2019 FINDINGS: Lower chest: There is a small pericardial effusion present. A small hiatal hernia is present. The visualized portions of the lungs are clear. Hepatobiliary: Although limited due to the lack of intravenous contrast, normal in appearance without gross focal abnormality. The patient is status post cholecystectomy. No biliary ductal dilation. Pancreas:  Unremarkable.  No surrounding inflammatory changes. Spleen: Normal in size. Although limited due to the lack of intravenous contrast, normal in appearance. Adrenals/Urinary Tract: Both adrenal glands appear normal. Multiple bilateral low-density lesions are seen throughout both kidneys. The largest measuring 5 cm off the lower pole of the left kidney and 4.3 cm off the lower pole of the right kidney which are likely renal cysts. Bladder is unremarkable. Stomach/Bowel: The stomach is mildly prominent and filled with foodstuff and air. The small bowel is normal in appearance. There is scattered colonic diverticula without diverticulitis. A moderate amount of colonic stool is present. Vascular/Lymphatic: There are no enlarged abdominal or pelvic lymph nodes. Scattered aortic atherosclerotic calcifications are seen without aneurysmal dilatation. Reproductive: The prostate is unremarkable. Other: Small fat containing inguinal hernias are present. Musculoskeletal: No acute or significant osseous findings. Prior cement fixation seen within the L1 vertebral body. Again noted are lytic changes and irregularity within the L2 vertebral body as on prior exam, and dating back to 2015. IMPRESSION: No acute intra-abdominal or pelvic pathology to explain the patient's symptoms. Diverticulosis without diverticulitis. Aortic Atherosclerosis (ICD10-I70.0). Small pericardial effusion Bilateral renal cysts. Electronically Signed   By: Prudencio Pair M.D.   On: 03/03/2020 03:08    Procedures Procedures (including critical  care time) Angiocath insertion Performed by: Orpah Greek  Consent: Verbal consent obtained. Risks and benefits: risks, benefits and alternatives were discussed Time out: Immediately prior to procedure a "time out" was called to verify the correct patient, procedure, equipment, support staff and site/side marked as required.  Preparation: Patient was prepped and draped in the usual sterile fashion.  Vein Location: R antecub  Ultrasound Guided  Gauge: 20  Normal blood return and flush without difficulty Patient tolerance: Patient tolerated the procedure well with no immediate complications.    Medications Ordered in ED Medications  acetaminophen (TYLENOL) tablet 650 mg (has no administration in time range)    Or  acetaminophen (TYLENOL) suppository 650 mg (has no administration in time range)  ondansetron (ZOFRAN) tablet 4 mg (has no administration in time range)    Or  ondansetron (ZOFRAN) injection 4 mg (has no administration in time range)  0.9 %  sodium  chloride infusion (has no administration in time range)  pantoprazole (PROTONIX) injection 40 mg (has no administration in time range)  sodium chloride 0.9 % bolus 500 mL (0 mLs Intravenous Stopped 03/03/20 0221)    ED Course  I have reviewed the triage vital signs and the nursing notes.  Pertinent labs & imaging results that were available during my care of the patient were reviewed by me and considered in my medical decision making (see chart for details).    MDM Rules/Calculators/A&P                      Patient presents to the emergency department for evaluation of rectal bleeding.  Patient has been feeling poorly most of today, has had some nausea and vomiting and then developed diarrhea.  Prior to arrival to the ER, diarrhea turned bloody.  During transport, however, he passed large amount of maroon blood and clots.  He has been stable, was mildly tachycardic at arrival but this resolved with fluids.  He has  not been hypotensive.  Abdominal exam revealed mild distention and tenderness but no peritoneal signs.  CAT scan does not show any acute pathology.  He has baseline mild anemia and his hemoglobin is on par with previous hemoglobins.  Based on the amount of bleeding, will require hospitalization for further management.  Final Clinical Impression(s) / ED Diagnoses Final diagnoses:  Acute GI bleeding    Rx / DC Orders ED Discharge Orders    None       Hadlee Burback, Gwenyth Allegra, MD 03/03/20 0425

## 2020-03-02 NOTE — ED Triage Notes (Signed)
Pt C/O abdominal pain and rectal bleeding for 2 hours.

## 2020-03-03 ENCOUNTER — Other Ambulatory Visit: Payer: Self-pay | Admitting: Family Medicine

## 2020-03-03 ENCOUNTER — Encounter (HOSPITAL_COMMUNITY): Payer: Self-pay | Admitting: Internal Medicine

## 2020-03-03 ENCOUNTER — Emergency Department (HOSPITAL_COMMUNITY): Payer: Medicare Other

## 2020-03-03 DIAGNOSIS — K5791 Diverticulosis of intestine, part unspecified, without perforation or abscess with bleeding: Secondary | ICD-10-CM | POA: Diagnosis not present

## 2020-03-03 DIAGNOSIS — I69351 Hemiplegia and hemiparesis following cerebral infarction affecting right dominant side: Secondary | ICD-10-CM | POA: Diagnosis not present

## 2020-03-03 DIAGNOSIS — K219 Gastro-esophageal reflux disease without esophagitis: Secondary | ICD-10-CM | POA: Diagnosis not present

## 2020-03-03 DIAGNOSIS — L89312 Pressure ulcer of right buttock, stage 2: Secondary | ICD-10-CM

## 2020-03-03 DIAGNOSIS — Z791 Long term (current) use of non-steroidal anti-inflammatories (NSAID): Secondary | ICD-10-CM | POA: Diagnosis not present

## 2020-03-03 DIAGNOSIS — L8990 Pressure ulcer of unspecified site, unspecified stage: Secondary | ICD-10-CM | POA: Insufficient documentation

## 2020-03-03 DIAGNOSIS — E785 Hyperlipidemia, unspecified: Secondary | ICD-10-CM | POA: Diagnosis not present

## 2020-03-03 DIAGNOSIS — D649 Anemia, unspecified: Secondary | ICD-10-CM | POA: Diagnosis not present

## 2020-03-03 DIAGNOSIS — G8929 Other chronic pain: Secondary | ICD-10-CM | POA: Diagnosis not present

## 2020-03-03 DIAGNOSIS — I129 Hypertensive chronic kidney disease with stage 1 through stage 4 chronic kidney disease, or unspecified chronic kidney disease: Secondary | ICD-10-CM | POA: Diagnosis present

## 2020-03-03 DIAGNOSIS — N179 Acute kidney failure, unspecified: Secondary | ICD-10-CM | POA: Diagnosis not present

## 2020-03-03 DIAGNOSIS — D509 Iron deficiency anemia, unspecified: Secondary | ICD-10-CM | POA: Diagnosis present

## 2020-03-03 DIAGNOSIS — K559 Vascular disorder of intestine, unspecified: Secondary | ICD-10-CM | POA: Diagnosis not present

## 2020-03-03 DIAGNOSIS — N1831 Chronic kidney disease, stage 3a: Secondary | ICD-10-CM | POA: Diagnosis present

## 2020-03-03 DIAGNOSIS — E782 Mixed hyperlipidemia: Secondary | ICD-10-CM

## 2020-03-03 DIAGNOSIS — K922 Gastrointestinal hemorrhage, unspecified: Secondary | ICD-10-CM | POA: Diagnosis present

## 2020-03-03 DIAGNOSIS — N400 Enlarged prostate without lower urinary tract symptoms: Secondary | ICD-10-CM | POA: Diagnosis not present

## 2020-03-03 DIAGNOSIS — Z20822 Contact with and (suspected) exposure to covid-19: Secondary | ICD-10-CM | POA: Diagnosis not present

## 2020-03-03 DIAGNOSIS — R1011 Right upper quadrant pain: Secondary | ICD-10-CM | POA: Diagnosis not present

## 2020-03-03 DIAGNOSIS — E1143 Type 2 diabetes mellitus with diabetic autonomic (poly)neuropathy: Secondary | ICD-10-CM | POA: Diagnosis present

## 2020-03-03 DIAGNOSIS — Z85118 Personal history of other malignant neoplasm of bronchus and lung: Secondary | ICD-10-CM | POA: Diagnosis not present

## 2020-03-03 DIAGNOSIS — Z8249 Family history of ischemic heart disease and other diseases of the circulatory system: Secondary | ICD-10-CM | POA: Diagnosis not present

## 2020-03-03 DIAGNOSIS — K5909 Other constipation: Secondary | ICD-10-CM | POA: Diagnosis present

## 2020-03-03 DIAGNOSIS — I10 Essential (primary) hypertension: Secondary | ICD-10-CM

## 2020-03-03 DIAGNOSIS — E1122 Type 2 diabetes mellitus with diabetic chronic kidney disease: Secondary | ICD-10-CM | POA: Diagnosis not present

## 2020-03-03 DIAGNOSIS — K2270 Barrett's esophagus without dysplasia: Secondary | ICD-10-CM | POA: Diagnosis present

## 2020-03-03 DIAGNOSIS — Z79899 Other long term (current) drug therapy: Secondary | ICD-10-CM | POA: Diagnosis not present

## 2020-03-03 DIAGNOSIS — Z85818 Personal history of malignant neoplasm of other sites of lip, oral cavity, and pharynx: Secondary | ICD-10-CM | POA: Diagnosis not present

## 2020-03-03 DIAGNOSIS — I69322 Dysarthria following cerebral infarction: Secondary | ICD-10-CM | POA: Diagnosis not present

## 2020-03-03 DIAGNOSIS — D631 Anemia in chronic kidney disease: Secondary | ICD-10-CM | POA: Diagnosis present

## 2020-03-03 LAB — BASIC METABOLIC PANEL
Anion gap: 8 (ref 5–15)
BUN: 30 mg/dL — ABNORMAL HIGH (ref 8–23)
CO2: 24 mmol/L (ref 22–32)
Calcium: 9.7 mg/dL (ref 8.9–10.3)
Chloride: 108 mmol/L (ref 98–111)
Creatinine, Ser: 1.67 mg/dL — ABNORMAL HIGH (ref 0.61–1.24)
GFR calc Af Amer: 46 mL/min — ABNORMAL LOW (ref >60)
GFR calc non Af Amer: 40 mL/min — ABNORMAL LOW (ref >60)
Glucose, Bld: 198 mg/dL — ABNORMAL HIGH (ref 70–99)
Potassium: 5.3 mmol/L — ABNORMAL HIGH (ref 3.5–5.1)
Sodium: 140 mmol/L (ref 135–145)

## 2020-03-03 LAB — HEMOGLOBIN
Hemoglobin: 7.9 g/dL — ABNORMAL LOW (ref 13.0–17.0)
Hemoglobin: 7.7 g/dL — ABNORMAL LOW (ref 13.0–17.0)

## 2020-03-03 LAB — COMPREHENSIVE METABOLIC PANEL
ALT: 11 U/L (ref 0–44)
AST: 14 U/L — ABNORMAL LOW (ref 15–41)
Albumin: 3.3 g/dL — ABNORMAL LOW (ref 3.5–5.0)
Alkaline Phosphatase: 78 U/L (ref 38–126)
Anion gap: 11 (ref 5–15)
BUN: 26 mg/dL — ABNORMAL HIGH (ref 8–23)
CO2: 22 mmol/L (ref 22–32)
Calcium: 9.7 mg/dL (ref 8.9–10.3)
Chloride: 108 mmol/L (ref 98–111)
Creatinine, Ser: 1.8 mg/dL — ABNORMAL HIGH (ref 0.61–1.24)
GFR calc Af Amer: 42 mL/min — ABNORMAL LOW (ref >60)
GFR calc non Af Amer: 37 mL/min — ABNORMAL LOW (ref >60)
Glucose, Bld: 211 mg/dL — ABNORMAL HIGH (ref 70–99)
Potassium: 4.8 mmol/L (ref 3.5–5.1)
Sodium: 141 mmol/L (ref 135–145)
Total Bilirubin: 0.3 mg/dL (ref 0.3–1.2)
Total Protein: 6.7 g/dL (ref 6.5–8.1)

## 2020-03-03 LAB — CBC WITH DIFFERENTIAL/PLATELET
Abs Immature Granulocytes: 0.06 10*3/uL (ref 0.00–0.07)
Basophils Absolute: 0.1 10*3/uL (ref 0.0–0.1)
Basophils Relative: 1 %
Eosinophils Absolute: 0.6 10*3/uL — ABNORMAL HIGH (ref 0.0–0.5)
Eosinophils Relative: 6 %
HCT: 35.4 % — ABNORMAL LOW (ref 39.0–52.0)
Hemoglobin: 10.4 g/dL — ABNORMAL LOW (ref 13.0–17.0)
Immature Granulocytes: 1 %
Lymphocytes Relative: 20 %
Lymphs Abs: 2 10*3/uL (ref 0.7–4.0)
MCH: 23 pg — ABNORMAL LOW (ref 26.0–34.0)
MCHC: 29.4 g/dL — ABNORMAL LOW (ref 30.0–36.0)
MCV: 78.3 fL — ABNORMAL LOW (ref 80.0–100.0)
Monocytes Absolute: 0.7 10*3/uL (ref 0.1–1.0)
Monocytes Relative: 7 %
Neutro Abs: 6.6 10*3/uL (ref 1.7–7.7)
Neutrophils Relative %: 65 %
Platelets: 311 10*3/uL (ref 150–400)
RBC: 4.52 MIL/uL (ref 4.22–5.81)
RDW: 17.9 % — ABNORMAL HIGH (ref 11.5–15.5)
WBC: 9.9 10*3/uL (ref 4.0–10.5)
nRBC: 0 % (ref 0.0–0.2)

## 2020-03-03 LAB — CBC
HCT: 31.5 % — ABNORMAL LOW (ref 39.0–52.0)
Hemoglobin: 9.3 g/dL — ABNORMAL LOW (ref 13.0–17.0)
MCH: 22.7 pg — ABNORMAL LOW (ref 26.0–34.0)
MCHC: 29.5 g/dL — ABNORMAL LOW (ref 30.0–36.0)
MCV: 77 fL — ABNORMAL LOW (ref 80.0–100.0)
Platelets: 305 10*3/uL (ref 150–400)
RBC: 4.09 MIL/uL — ABNORMAL LOW (ref 4.22–5.81)
RDW: 17.9 % — ABNORMAL HIGH (ref 11.5–15.5)
WBC: 13.7 10*3/uL — ABNORMAL HIGH (ref 4.0–10.5)
nRBC: 0 % (ref 0.0–0.2)

## 2020-03-03 LAB — GLUCOSE, CAPILLARY
Glucose-Capillary: 187 mg/dL — ABNORMAL HIGH (ref 70–99)
Glucose-Capillary: 147 mg/dL — ABNORMAL HIGH (ref 70–99)
Glucose-Capillary: 156 mg/dL — ABNORMAL HIGH (ref 70–99)
Glucose-Capillary: 112 mg/dL — ABNORMAL HIGH (ref 70–99)

## 2020-03-03 LAB — IRON AND TIBC
Iron: 40 ug/dL — ABNORMAL LOW (ref 45–182)
Saturation Ratios: 19 % (ref 17.9–39.5)
TIBC: 208 ug/dL — ABNORMAL LOW (ref 250–450)
UIBC: 168 ug/dL

## 2020-03-03 LAB — TYPE AND SCREEN
ABO/RH(D): O POS
Antibody Screen: NEGATIVE

## 2020-03-03 LAB — LACTIC ACID, PLASMA: Lactic Acid, Venous: 2.9 mmol/L (ref 0.5–1.9)

## 2020-03-03 LAB — HEMOGLOBIN A1C
Hgb A1c MFr Bld: 7.4 % — ABNORMAL HIGH (ref 4.8–5.6)
Mean Plasma Glucose: 165.68 mg/dL

## 2020-03-03 LAB — MRSA PCR SCREENING: MRSA by PCR: NEGATIVE

## 2020-03-03 LAB — HEMOGLOBIN AND HEMATOCRIT, BLOOD
HCT: 34.2 % — ABNORMAL LOW (ref 39.0–52.0)
Hemoglobin: 10.3 g/dL — ABNORMAL LOW (ref 13.0–17.0)

## 2020-03-03 LAB — PROTIME-INR
INR: 1 (ref 0.8–1.2)
Prothrombin Time: 13.4 seconds (ref 11.4–15.2)

## 2020-03-03 LAB — FERRITIN: Ferritin: 104 ng/mL (ref 24–336)

## 2020-03-03 LAB — RESPIRATORY PANEL BY RT PCR (FLU A&B, COVID)
Influenza A by PCR: NEGATIVE
Influenza B by PCR: NEGATIVE
SARS Coronavirus 2 by RT PCR: NEGATIVE

## 2020-03-03 MED ORDER — SODIUM CHLORIDE 0.9 % IV SOLN: INTRAVENOUS | Status: AC

## 2020-03-03 MED ORDER — PANTOPRAZOLE SODIUM 40 MG IV SOLR: 40.0000 mg | INTRAVENOUS | Status: DC

## 2020-03-03 MED ORDER — LABETALOL HCL 5 MG/ML IV SOLN: 5.0000 mg | INTRAVENOUS | Status: DC | PRN

## 2020-03-03 MED ORDER — ACETAMINOPHEN 650 MG RE SUPP: 650.0000 mg | Freq: Four times a day (QID) | RECTAL | Status: DC | PRN

## 2020-03-03 MED ORDER — TAMSULOSIN HCL 0.4 MG PO CAPS
0.4000 mg | ORAL_CAPSULE | Freq: Every day | ORAL | Status: DC
  Administered 2020-03-03 – 2020-03-05 (×3): 0.4 mg via ORAL
  Filled 2020-03-03 (×3): qty 1

## 2020-03-03 MED ORDER — METOPROLOL SUCCINATE ER 25 MG PO TB24
25.0000 mg | ORAL_TABLET | Freq: Every day | ORAL | Status: DC
  Administered 2020-03-04 – 2020-03-06 (×3): 25 mg via ORAL
  Filled 2020-03-03 (×3): qty 1

## 2020-03-03 MED ORDER — CLONIDINE HCL 0.1 MG PO TABS: 0.1000 mg | ORAL_TABLET | Freq: Two times a day (BID) | ORAL | Status: DC

## 2020-03-03 MED ORDER — ONDANSETRON HCL 4 MG/2ML IJ SOLN: 4.0000 mg | Freq: Four times a day (QID) | INTRAMUSCULAR | Status: DC | PRN

## 2020-03-03 MED ORDER — PANTOPRAZOLE SODIUM 40 MG IV SOLR
40.0000 mg | Freq: Once | INTRAVENOUS | Status: AC
  Administered 2020-03-03: 10:00:00 40 mg via INTRAVENOUS
  Filled 2020-03-03: qty 40

## 2020-03-03 MED ORDER — PANTOPRAZOLE SODIUM 40 MG PO TBEC
40.0000 mg | DELAYED_RELEASE_TABLET | Freq: Every day | ORAL | Status: DC
  Administered 2020-03-04 – 2020-03-06 (×3): 40 mg via ORAL
  Filled 2020-03-03 (×3): qty 1

## 2020-03-03 MED ORDER — INSULIN ASPART 100 UNIT/ML ~~LOC~~ SOLN
0.0000 [IU] | Freq: Three times a day (TID) | SUBCUTANEOUS | Status: DC
  Administered 2020-03-03 (×2): 2 [IU] via SUBCUTANEOUS
  Administered 2020-03-03 – 2020-03-04 (×3): 1 [IU] via SUBCUTANEOUS
  Administered 2020-03-05: 09:00:00 2 [IU] via SUBCUTANEOUS
  Administered 2020-03-05 (×2): 1 [IU] via SUBCUTANEOUS
  Administered 2020-03-06: 11:00:00 2 [IU] via SUBCUTANEOUS

## 2020-03-03 MED ORDER — CHLORHEXIDINE GLUCONATE CLOTH 2 % EX PADS
6.0000 | MEDICATED_PAD | Freq: Every day | CUTANEOUS | Status: DC
  Administered 2020-03-03 – 2020-03-05 (×3): 6 via TOPICAL

## 2020-03-03 MED ORDER — ONDANSETRON HCL 4 MG PO TABS: 4.0000 mg | ORAL_TABLET | Freq: Four times a day (QID) | ORAL | Status: DC | PRN

## 2020-03-03 MED ORDER — PANTOPRAZOLE SODIUM 40 MG IV SOLR: 40.0000 mg | Freq: Once | INTRAVENOUS | Status: DC

## 2020-03-03 MED ORDER — SIMVASTATIN 20 MG PO TABS
20.0000 mg | ORAL_TABLET | Freq: Every day | ORAL | Status: DC
  Administered 2020-03-03 – 2020-03-05 (×3): 20 mg via ORAL
  Filled 2020-03-03 (×3): qty 1

## 2020-03-03 MED ORDER — ACETAMINOPHEN 325 MG PO TABS
650.0000 mg | ORAL_TABLET | Freq: Four times a day (QID) | ORAL | Status: DC | PRN
  Administered 2020-03-04: 15:00:00 650 mg via ORAL
  Filled 2020-03-03: qty 2

## 2020-03-03 MED ORDER — INSULIN ASPART 100 UNIT/ML ~~LOC~~ SOLN: 0.0000 [IU] | Freq: Every day | SUBCUTANEOUS | Status: DC

## 2020-03-03 NOTE — ED Notes (Signed)
Date and time results received: 03/03/20 0315 (use smartphrase ".now" to insert current time)  Test: lactic acid Critical Value: 2.9  Name of Provider Notified: Dr Betsey Holiday  Orders Received? Or Actions Taken?: Actions Taken: no orders received

## 2020-03-03 NOTE — Consult Note (Addendum)
Referring Provider: [facility] Primary Care Physician:  [name], MD Primary Gastroenterologist:  Dr. Gala Romney  Date of Admission: 03/03/20 Date of Consultation: 03/03/20  Reason for Consultation:  Lower GI bleed  HPI:  [name] is a 74 y.o. year old male with history of stroke with residual right-sided hemiparesis, hepatic steatosis, renal insufficiency, non-small cell lung cancer, lymphoma, adenocarcinoma of the parotid gland, asthma, HTN, DM Barrett's esophagus, GERD, delayed stomach emptying hemorrhoids, colon polyps, chronic constipation and hemorrhagic colitis in 2013 presumed infectious.  Last EGD in 2017 with Barrett's esophagus, abnormal stomach of uncertain significance, retained gastric contents, normal examined duodenum.  No recommendations to repeat EGD.  Colonoscopy attempted in 2017 but incomplete due to inadequate prep.  Last complete colonoscopy in 2012 with pancolonic diverticula, hemorrhoids, tubular adenoma. We last saw patient in March 2018 for follow-up. He was doing fairly well at that time with Amitiza 24 mcg BID for chronic constipation and Protonix daily for GERD. He did not desire repeat TCS.   He presented to the ED on 02/29/2020 with abdominal pain, distention, nausea, vomiting, and bloody diarrhea.  ED course: Mildly tachycardic. Remained hemodynamically stable. Afebrile. Labs remarkable for hemoglobin 10.4 (L) with microcytic indices, platelets 311, creatinine 1.8 (H), electrolytes and LFTs unremarkable, INR 1.0, lactic acid 2.9 (H).  CT abdomen and pelvis without IV contrast without acute findings to explain patient's symptoms.  Diverticulosis without diverticulitis, aortic atherosclerosis, small pericardial effusion, bilateral renal cyst. He received Zofran, and IV fluids in the ED. IV Protonix ordered but not given.  Hospitalists consulted for admission for further management   Today: 2 days ago started with abdominal pain, diarrhea, and bloody  stools. Denies black stool. States it was red but can't commend on whether it was bright or dark red. Associated nausea and vomiting. Abdominal pain was diffuse. Prior to th, he has has chronic intermittent RUQ adominal pain that comes goes. Stools were watery . Last BM was in the ED. Can't tell me how many stools he was having a day. Was passing blood with every BM. Prior to 2 days ago, no blood in the stool. No fever. Recently on doxycycline for abscess on his back. No sick contacts. No livestock contacts. Felt he may pass out a couple days ago. Prior to onset of abdominal pain and rectal bleeding. Associated chest pain and shortness of breath. No true syncope. Symptoms didn't last ling. No current lightheadedness/dizziness. Has weakness. This is chronic. Abdominal pain has improved quite a bit. No nausea or vomiting at this time. Last BM was in the ED. No acid reflux or heartburn. Usually takes Protonix daily at home and this works well. No dysphagia. Usually one BM every 2 days. Denies taking Amitiza or stool softeners.   No unintentional weight loss. Appetite is good.  No current chest pain. No shortness of breath. No heart palpitations. Urinating regualrly. No blood in the urine.   Takes Advil a few times a week for "a while". For pain in his feet.   Past Medical History:  Diagnosis Date  . Arthritis   . Asthma    "hx of"  . Barrett's esophagus    EGD 03/23/2011 & EGD 2/09 bx proven  . BPH (benign prostatic hyperplasia)   . Cancer of parotid gland ([place]) 11/23/12   Adenocarcinoma  . Chronic abdominal pain   . Chronic constipation   . Colon polyp 03/23/2011   tubular adenoma, Dr. Gala Romney  . Complete lesion of L2 level of lumbar  spinal cord ([place]) 07/15/2011  . CVA (cerebral infarction) 1998   right sided deficit  . Delayed gastric emptying 2018  . Diverticulosis    TCS 03/23/11 pancolonic diverticula &TCS 5/08, pancolonic diverticula  . DM (diabetes mellitus) ([place])   . Edema of lower extremity  12/21/12   bilateral   . Esotropia of left eye   . GERD (gastroesophageal reflux disease)   . Glaucoma (increased eye pressure)   . Gout   . Gout   . Hemorrhagic colitis 06/06/2012.  [name] Hemorrhoids, internal 03/23/2011   tcs by Dr. Gala Romney  . Hepatitis    esosiniphilic, tx with prednisone  . Hiatal hernia   . History of radiation therapy 05/21/18- 05/30/18   Left Lung/ 54 Gy delivered in 3 fractions of 18 Gy. SBRT  . HTN (hypertension)   . Hx of radiation therapy 1974   right base of skull area-lymphoma  . Hyperlipidemia   . Lower facial weakness    Right  . Lymphoma ([place]) 1974   XRT at [facility], right base of skull area  . Neuropathy   . Non-small cell lung cancer ([place]) dx'd 04/26/18  . Peripheral edema    R>L legs  . Rash    chronic, recurrent, R>L legs  . Renal insufficiency   . Steatohepatitis    liver biopsy 2009  . Stroke [facility]) 1998   right hemiparesis/plegia    Past Surgical History:  Procedure Laterality Date  . BIOPSY  12/01/2016   Procedure: BIOPSY;  Surgeon: Daneil Dolin, MD;  Location: [facility];  Service: Endoscopy;;  duodenum, gastric, esophagus  . CHOLECYSTECTOMY    . COLONOSCOPY  03/23/11   Dr. Gala Romney  pancolonic diverticula, hemorrhoids, tubular adenoma.. next tcs 03/2016  . COLONOSCOPY WITH PROPOFOL N/A 12/01/2016   inadequate bowel prep precluded exam  . ESOPHAGOGASTRODUODENOSCOPY  02/05/08   goblet cell metaplasia/negative for H.pylori  . ESOPHAGOGASTRODUODENOSCOPY  03/23/11   Dr. Gala Romney, barretts, hiatal hernia  . ESOPHAGOGASTRODUODENOSCOPY (EGD) WITH PROPOFOL N/A 12/01/2016   Dr. Gala Romney: Large amount of retained gastric contents precluded completion of the stomach. Mucosal changes were found in the stomach. Erosions and somewhat scalloped appearing mucosa present, reactive gastritis/no H pylori. Barrett's esophagus noted, no dysplasia on biopsy. Duodenal biopsies taken as well, benign, no evidence of eosinophilia.  . IR FLUORO GUIDED NEEDLE PLC  ASPIRATION/INJECTION LOC  12/13/2018  . IR KYPHO LUMBAR INC FX REDUCE BONE BX UNI/BIL CANNULATION INC/IMAGING  12/13/2018  . IR RADIOLOGY PERIPHERAL GUIDED IV START  12/13/2018  . IR US GUIDE VASC ACCESS LEFT  12/13/2018  . MASS BIOPSY  11/01/2012   Procedure: NECK MASS BIOPSY;  Surgeon: Ascencion Dike, MD;  Location: [facility] ORS;  Service: ENT;  Laterality: Right;  Excisional Bx Right Neck Mass; attempted external jugular cutdown of left side  . PAROTIDECTOMY  11/24/2012   Procedure: PAROTIDECTOMY;  Surgeon: Ascencion Dike, MD;  Location: [place];  Service: ENT;  Laterality: N/A;  Total parotidectomy  . PLEURECTOMY    . right lymph node removal Right    behind right ear  . Right video-assisted thoracic surgery, pleurectomy, and pleurodesis  2011  . VIDEO BRONCHOSCOPY WITH ENDOBRONCHIAL NAVIGATION N/A 04/26/2018   Procedure: VIDEO BRONCHOSCOPY WITH ENDOBRONCHIAL NAVIGATION;  Surgeon: Melrose Nakayama, MD;  Location: [place];  Service: Thoracic;  Laterality: N/A;    Prior to Admission medications   Medication Sig Start Date End Date Taking? Authorizing Provider  allopurinol (ZYLOPRIM) 300 MG tablet Take 1 tablet (300  mg total) by mouth daily. 11/19/19   [name], MD  AMITIZA 24 MCG capsule TAKE 1 CAPSULE(24 MCG) BY MOUTH TWICE DAILY WITH A MEAL 11/19/19   Aliene Altes S, PA-C  amLODipine (NORVASC) 10 MG tablet TAKE 1 TABLET(10 MG) BY MOUTH DAILY 11/19/19   [place], [name], MD  Cholecalciferol (VITAMIN D3) 125 MCG (5000 UT) CAPS Take 1 capsule (5,000 Units total) by mouth daily. Patient taking differently: Take 5,000 Units by mouth every morning.  03/08/19   [name], MD  cloNIDine (CATAPRES) 0.2 MG tablet Take 1 tablet (0.2 mg total) by mouth 3 (three) times daily. 11/19/19   [name], MD  diclofenac sodium (VOLTAREN) 1 % GEL Apply twice a day for affected area on ankle as needed 09/18/19   [name], MD  doxycycline (VIBRAMYCIN) 100 MG capsule Take 1 capsule (100 mg total) by  mouth 2 (two) times daily. 02/14/20   [name], MD  furosemide (LASIX) 40 MG tablet Take 1 tablet (40 mg total) by mouth daily. 11/19/19   [place], [name], MD  gabapentin (NEURONTIN) 300 MG capsule TAKE 2 CAPSULES BY MOUTH TWICE DAILY, MAY TAKE AN ADDTIONAL 2 CAPSULES MIDDAY AS NEEDED FOR PAIN 02/10/20   [name], MD  insulin glargine (LANTUS SOLOSTAR) 100 UNIT/ML Solostar Pen Inject 42 Units into the skin at bedtime. 02/13/20   Cassandria Anger, MD  Lancets (ONETOUCH DELICA PLUS [id number]) MISC USE TO TEST BLOOD SUGAR FOUR TIMES DAILY 02/27/20   Cassandria Anger, MD  lisinopril (ZESTRIL) 20 MG tablet Take 1 tablet (20 mg total) by mouth daily. 11/19/19   [name], MD  metFORMIN (GLUCOPHAGE) 500 MG tablet TAKE 1 TABLET BY MOUTH TWICE DAILY 11/11/19   Nida, Marella Chimes, MD  metoprolol succinate (TOPROL-XL) 50 MG 24 hr tablet TAKE 1 TABLET BY MOUTH EVERY DAY WITH OR IMMEDIATELY FOLLOWING A MEAL 12/09/19   [place], [name], MD  nystatin (MYCOSTATIN/NYSTOP) powder Apply topically 3 (three) times daily. 07/24/19   [name], MD  [facility] VERIO test strip USE TO CHECK BLOOD SUGAR FOUR TIMES DAILY 11/19/19   Cassandria Anger, MD  oxyCODONE-acetaminophen (PERCOCET) 7.5-325 MG tablet Take 1 tablet by mouth every 4 (four) hours as needed. 01/10/20   [name], MD  pantoprazole (PROTONIX) 40 MG tablet Take 1 tablet (40 mg total) by mouth daily. 11/19/19   [place], [name], MD  potassium chloride SA (KLOR-CON) 20 MEQ tablet TAKE 1 TABLET(20 MEQ) BY MOUTH DAILY 12/30/19   [name], MD  simvastatin (ZOCOR) 20 MG tablet Take 1 tablet (20 mg total) by mouth at bedtime. 11/19/19   [name], MD  tamsulosin (FLOMAX) 0.4 MG CAPS capsule Take 1 capsule (0.4 mg total) by mouth daily. 11/19/19   [place], [name], MD  tiZANidine (ZANAFLEX) 2 MG tablet TAKE 1 TABLET(2 MG) BY MOUTH TWICE DAILY AS NEEDED 08/28/19   [place], [name], MD  Triamcinolone  Acetonide (TRIAMCINOLONE 0.1 % CREAM : EUCERIN) CREA Apply 1 application topically 2 (two) times daily as needed. Patient taking differently: Apply 1 application topically 2 (two) times daily as needed for rash.  02/06/18   [name], MD    Current Facility-Administered Medications  Medication Dose Route Frequency Provider Last Rate Last Admin  . 0.9 %  sodium chloride infusion   Intravenous Continuous Reubin Milan, MD      . acetaminophen (TYLENOL) tablet 650 mg  650  mg Oral Q6H PRN Reubin Milan, MD       Or  . acetaminophen (TYLENOL) suppository 650 mg  650 mg Rectal Q6H PRN Reubin Milan, MD      . insulin aspart (novoLOG) injection 0-5 Units  0-5 Units Subcutaneous QHS Barton Dubois, MD      . insulin aspart (novoLOG) injection 0-9 Units  0-9 Units Subcutaneous TID WC Barton Dubois, MD      . labetalol (NORMODYNE) injection 5 mg  5 mg Intravenous Q2H PRN Barton Dubois, MD      . ondansetron [facility]) tablet 4 mg  4 mg Oral Q6H PRN Reubin Milan, MD       Or  . ondansetron [facility]) injection 4 mg  4 mg Intravenous Q6H PRN Reubin Milan, MD      . Derrill Memo ON 03/04/2020] pantoprazole (PROTONIX) injection 40 mg  40 mg Intravenous Q24H Reubin Milan, MD      . tamsulosin [facility]) capsule 0.4 mg  0.4 mg Oral QPC supper Barton Dubois, MD        Allergies as of 03/02/2020  . (No Known Allergies)    Family History  Problem Relation Age of Onset  . Heart failure Mother   . Heart failure Father   . Heart failure Sister   . Heart failure Son     Social History   Socioeconomic History  . Marital status: Married    Spouse name: Not on file  . Number of children: 1  . Years of education: Not on file  . Highest education level: Not on file  Occupational History  . Occupation: disabled  Tobacco Use  . Smoking status: Former Smoker    Packs/day: 3.00    Years: 25.00    Pack years: 75.00    Types: Cigarettes    Quit date: 03/01/1997     Years since quitting: 23.0  . Smokeless tobacco: Never Used  Substance and Sexual Activity  . Alcohol use: No  . Drug use: No  . Sexual activity: Not Currently  Other Topics Concern  . Not on file  Social History Narrative  . Not on file   Social Determinants of Health   Financial Resource Strain:   . Difficulty of Paying Living Expenses:   Food Insecurity:   . Worried About Charity fundraiser in the Last Year:   . Arboriculturist in the Last Year:   Transportation Needs:   . Film/video editor (Medical):   [name] Lack of Transportation (Non-Medical):   Physical Activity:   . Days of Exercise per Week:   . Minutes of Exercise per Session:   Stress:   . Feeling of Stress :   Social Connections:   . Frequency of Communication with Friends and Family:   . Frequency of Social Gatherings with Friends and Family:   . Attends Religious Services:   . Active Member of Clubs or Organizations:   . Attends Archivist Meetings:   [name] Marital Status:   Intimate Partner Violence:   . Fear of Current or Ex-Partner:   . Emotionally Abused:   [name] Physically Abused:   . Sexually Abused:     Review of Systems: Gen: See HPI CV: See HPI Resp: See HPI GI: See HPI.  GU : See HPI MS: Reports pain in his feet.  Derm: Denies rash Psych: Denies depression or anxiety Heme: See HPI  Physical Exam: Vital signs in last 24 hours: Temp:  [  97.6 F (36.4 C)-98.5 F (36.9 C)] 98.5 F (36.9 C) (03/23 0641) Pulse Rate:  [96-113] 96 (03/23 0715) Resp:  [14-21] 15 (03/23 0715) BP: (118-138)/(92-98) 132/95 (03/23 0715) SpO2:  [98 %-100 %] 100 % (03/23 0715) Weight:  [111.8 kg-112.9 kg] 111.8 kg (03/23 0641)   General:   Alert,  Well-developed, well-nourished, pleasant and cooperative in NAD.  Head:  Normocephalic and atraumatic. Eyes:  Sclera clear, no icterus. Conjunctiva pink. Ears:  Normal auditory acuity. Nose:  No deformity, discharge,  or lesions. Mouth:  Oral mucosa is moist  without lesions. Lungs:  Clear throughout to auscultation.   No wheezes, crackles, or rhonchi. No acute distress. Heart:  Regular rate and rhythm; no murmurs, clicks, rubs,  or gallops. Abdomen:  Soft, and nondistended. Normal bowel sounds. Mild generalized tenderness to palpation. No masses, hepatosplenomegaly or hernias noted.  No guarding or rebound.   Rectal:  Deferred. Msk:  Symmetrical without gross deformities.  Extremities:  With 2+ pitting edema up to his knees.  Neurologic:  Alert and  oriented x4;  Right hemiparesis. Skin:  Intact without significant lesions or rashes. Psych: Normal mood and affect.  Intake/Output from previous day: No intake/output data recorded. Intake/Output this shift: No intake/output data recorded.  Lab Results: Recent Labs    03/03/20 0017 03/03/20 0500  WBC 9.9  --   HGB 10.4* 10.3*  HCT 35.4* 34.2*  PLT 311  --    BMET Recent Labs    03/03/20 0017  NA 141  K 4.8  CL 108  CO2 22  GLUCOSE 211*  BUN 26*  CREATININE 1.80*  CALCIUM 9.7   LFT Recent Labs    03/03/20 0017  PROT 6.7  ALBUMIN 3.3*  AST 14*  ALT 11  ALKPHOS 78  BILITOT 0.3   PT/INR Recent Labs    03/03/20 0017  LABPROT 13.4  INR 1.0   Hepatitis Panel No results for input(s): HEPBSAG, HCVAB, HEPAIGM, HEPBIGM in the last 72 hours. C-Diff No results for input(s): CDIFFTOX in the last 72 hours.  Studies/Results: CT ABDOMEN PELVIS WO CONTRAST  Result Date: 03/03/2020 CLINICAL DATA:  Abdominal pain and rectal bleeding EXAM: CT ABDOMEN AND PELVIS WITHOUT CONTRAST TECHNIQUE: Multidetector CT imaging of the abdomen and pelvis was performed following the standard protocol without IV contrast. COMPARISON:  July 15, 2019 FINDINGS: Lower chest: There is a small pericardial effusion present. A small hiatal hernia is present. The visualized portions of the lungs are clear. Hepatobiliary: Although limited due to the lack of intravenous contrast, normal in appearance without  gross focal abnormality. The patient is status post cholecystectomy. No biliary ductal dilation. Pancreas:  Unremarkable.  No surrounding inflammatory changes. Spleen: Normal in size. Although limited due to the lack of intravenous contrast, normal in appearance. Adrenals/Urinary Tract: Both adrenal glands appear normal. Multiple bilateral low-density lesions are seen throughout both kidneys. The largest measuring 5 cm off the lower pole of the left kidney and 4.3 cm off the lower pole of the right kidney which are likely renal cysts. Bladder is unremarkable. Stomach/Bowel: The stomach is mildly prominent and filled with foodstuff and air. The small bowel is normal in appearance. There is scattered colonic diverticula without diverticulitis. A moderate amount of colonic stool is present. Vascular/Lymphatic: There are no enlarged abdominal or pelvic lymph nodes. Scattered aortic atherosclerotic calcifications are seen without aneurysmal dilatation. Reproductive: The prostate is unremarkable. Other: Small fat containing inguinal hernias are present. Musculoskeletal: No acute or significant osseous findings. Prior cement  fixation seen within the L1 vertebral body. Again noted are lytic changes and irregularity within the L2 vertebral body as on prior exam, and dating back to 2015. IMPRESSION: No acute intra-abdominal or pelvic pathology to explain the patient's symptoms. Diverticulosis without diverticulitis. Aortic Atherosclerosis (ICD10-I70.0). Small pericardial effusion Bilateral renal cysts. Electronically Signed   By: Prudencio Pair M.D.   On: 03/03/2020 03:08    Impression: 74 year old male with multiple comorbidities as per above.  GI history significant for GERD on Protonix daily, Barrett's esophagus with last EGD in 2017, delayed stomach emptying, hemorrhoids, pancolonic diverticula, tubular adenoma with last complete colonoscopy in 2012, chronic constipation, and hemorrhagic colitis in 2013 presumed  infectious.  He presented to the ED with abdominal pain, nausea, vomiting, and bloody diarrhea.  Mild tachycardia but remained hemodynamically stable.  Initial hemoglobin 10.4 (stable ) with microcytic indices, platelets normal, INR 1.0, bump in creatinine at 1.8, lactic acid 2.9.  CT abdomen and pelvis without contrast without acute abnormalities.   Rectal Bleeding: New onset of bloody diarrhea x 2 days with associated generalized abdominal pain, nausea, and vomiting. Patient denies melena. Per ED note, he has voluminous amounts of dark maroon blood oozing from anus. Initial hemoglobin 10.4 which appears to be at baseline.  Repeat hemoglobin this morning down to 9.3. No obvious colonic abnormalities on CT; however findings are limited due to lack of IV contrast.  Last attempted colonoscopy in 2017 incomplete due to poor prep.  Last completed colonoscopy in 2012 with tubular adenoma, pancolonic diverticula, and hemorrhoids. Seems his rectal bleeding is tapering of as his last bloody BM was in the ED about 5 hours ago. Abdominal pain is also improving. No further N/V.    As patient reports symptoms of presyncope, chest pain, and shortness of breath prior to onset of abdominal pain and rectal bleeding and considering elevated lactic acid at 2.9, query whether this is ischemic colitis.  Cannot rule out infectious colitis as he was recently on antibiotics and reports watery diarrhea. Chronic NSAID use may also be playing a tole. Presentation is not consistent with diverticular bleed. Less likely upper GI bleed as he is on protonix daily although can't rule this out with chronic NSAIDs. BUN just slightly elevated at 26. He will need IV fluids, agree with Protonix IV daily. Patient will likely need TCS for further evaluation. Will discuss role/timing of colonoscopy with Dr. Oneida Alar.  Patient may need extended bowel prep as prior colonoscopies have been incomplete due to poor prep.  Okay to continue clear liquids at  this time.   Anemia: Initial hemoglobin of 10.4 with microcytic indices.  This appears to be consistent with baseline hemoglobin in August 2020 of 10.4.  Appears he has had intermittent mild microcytic anemia over the last several years which I suspect is likely secondary to his chronic kidney disease.  Repeat hemoglobin this morning down to 9.3.  Suspect this is likely equilibration as he had several bloody BMs yesterday and is also received IV fluids. Will update iron panel. Continue to monitor for overt GI bleeding, monitor H&H, and transfuse as necessary.  Will discuss role/timing of colonoscopy with Dr. Oneida Alar.  GERD: Chronic. Well controlled on Protonix. Continue Protonix daily.   Plan: Discuss role/timing of TCS with Dr. Oneida Alar. The risks, benefits, and alternatives have been discussed in detail with patient. They have stated understanding and desire to proceed if needed.  Continue IV Protonix 40 mg daily. Ordered 1 time dose as patient did not receive this  in the ED.  Continue IV fluids.  Monitor for ongoing overt GI bleeding.  Monitor H/H. Transfuse as necessary. Update iron panel.  Monitor for loose/watery BMs. Would consider checking C. diff and GI pathogen panel should he continue with diarrhea.  Follow lactic acid.  Continue clear liquids.      LOS: 0 days    03/03/2020, 7:51 AM   Aliene Altes, [facility]

## 2020-03-03 NOTE — H&P (Signed)
History and Physical    [name] HCW:[id number] DOB: [birth year] DOA: 03/02/2020  Referring MD/NP/PA: Dr. Betsey Holiday PCP: [name], MD  Patient coming from: Home  Chief Complaint: Rectal bleeding.  HPI: [name] is a 74 y.o. male 74 year old male with a past medical history significant for breast esophagus, BPH, parotid gland cancer (adenocarcinoma), chronic abdominal pain, history of CVA with residual right hemiparesis and dysarthria, delayed gastric emptying, type 2 diabetes, GERD, hyperlipidemia and diverticulosis; who presented to the emergency department secondary to rectal bleeding.  Family reports that the patient has not been feeling well for the last 2 to 3 days, specifically reporting sudden worsening of his chronic abdominal pain, vague diffuse mid epigastric region and across lower aspect of his abdomen; around 6 out of 10 in intensity at the [place] and on the day of admission having associated multiple episodes of diarrhea with subsequent feces mixed with blood and then Just frank bloody stools.  Family reports no fever, no new neurologic deficits, no chest pain, no nausea or vomiting, no dysuria, no hematuria, no URI symptoms or other complaints.  In the ED work-up demonstrated positive bloody discharge per rectum, and a CAT scan that ended demonstrating no significant abnormalities and findings of diverticulosis without diverticulitis.  Blood work demonstrated hemoglobin in the 10.3 range and a slightly worsening renal function along with mild elevation of BUN.  GI service was consulted and [facility] has been called admit patient for further evaluation and management.  2 IVs were requested and the patient has type and screen.  Past Medical/Surgical History: Past Medical History:  Diagnosis Date  . Arthritis   . Asthma    "hx of"  . Barrett's esophagus    EGD 03/23/2011 & EGD 2/09 bx proven  . BPH (benign prostatic hyperplasia)   . Cancer of parotid gland ([place]) 11/23/12    Adenocarcinoma  . Chronic abdominal pain   . Chronic constipation   . Colon polyp 03/23/2011   tubular adenoma, Dr. Gala Romney  . 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 ([place]) 07/15/2011  . CVA (cerebral infarction) 1998   right sided deficit  . Delayed gastric emptying 2018  . Diverticulosis    TCS 03/23/11 pancolonic diverticula &TCS 5/08, pancolonic diverticula  . DM (diabetes mellitus) ([place])   . Edema of lower extremity 12/21/12   bilateral   . Esotropia of left eye   . GERD (gastroesophageal reflux disease)   . Glaucoma (increased eye pressure)   . Gout   . Gout   . Hemorrhagic colitis 06/06/2012.  [name] Hemorrhoids, internal 03/23/2011   tcs by Dr. Gala Romney  . Hepatitis    esosiniphilic, tx with prednisone  . Hiatal hernia   . History of radiation therapy 05/21/18- 05/30/18   Left Lung/ 54 Gy delivered in 3 fractions of 18 Gy. SBRT  . HTN (hypertension)   . Hx of radiation therapy 1974   right base of skull area-lymphoma  . Hyperlipidemia   . Lower facial weakness    Right  . Lymphoma ([place]) 1974   XRT at [facility], right base of skull area  . Neuropathy   . Non-small cell lung cancer ([place]) dx'd 04/26/18  . Peripheral edema    R>L legs  . Rash    chronic, recurrent, R>L legs  . Renal insufficiency   . Steatohepatitis    liver biopsy 2009  . Stroke [facility]) 1998   right hemiparesis/plegia    Past Surgical History:  Procedure Laterality  Date  . BIOPSY  12/01/2016   Procedure: BIOPSY;  Surgeon: Daneil Dolin, MD;  Location: [facility];  Service: Endoscopy;;  duodenum, gastric, esophagus  . CHOLECYSTECTOMY    . COLONOSCOPY  03/23/11   Dr. Gala Romney  pancolonic diverticula, hemorrhoids, tubular adenoma.. next tcs 03/2016  . COLONOSCOPY WITH PROPOFOL N/A 12/01/2016   inadequate bowel prep precluded exam  . ESOPHAGOGASTRODUODENOSCOPY  02/05/08   goblet cell metaplasia/negative for H.pylori  . ESOPHAGOGASTRODUODENOSCOPY  03/23/11   Dr. Gala Romney, barretts, hiatal hernia  .  ESOPHAGOGASTRODUODENOSCOPY (EGD) WITH PROPOFOL N/A 12/01/2016   Dr. Gala Romney: Large amount of retained gastric contents precluded completion of the stomach. Mucosal changes were found in the stomach. Erosions and somewhat scalloped appearing mucosa present, reactive gastritis/no H pylori. Barrett's esophagus noted, no dysplasia on biopsy. Duodenal biopsies taken as well, benign, no evidence of eosinophilia.  . IR FLUORO GUIDED NEEDLE PLC ASPIRATION/INJECTION LOC  12/13/2018  . IR KYPHO LUMBAR INC FX REDUCE BONE BX UNI/BIL CANNULATION INC/IMAGING  12/13/2018  . IR RADIOLOGY PERIPHERAL GUIDED IV START  12/13/2018  . IR US GUIDE VASC ACCESS LEFT  12/13/2018  . MASS BIOPSY  11/01/2012   Procedure: NECK MASS BIOPSY;  Surgeon: Ascencion Dike, MD;  Location: [facility] ORS;  Service: ENT;  Laterality: Right;  Excisional Bx Right Neck Mass; attempted external jugular cutdown of left side  . PAROTIDECTOMY  11/24/2012   Procedure: PAROTIDECTOMY;  Surgeon: Ascencion Dike, MD;  Location: [place];  Service: ENT;  Laterality: N/A;  Total parotidectomy  . PLEURECTOMY    . right lymph node removal Right    behind right ear  . Right video-assisted thoracic surgery, pleurectomy, and pleurodesis  2011  . VIDEO BRONCHOSCOPY WITH ENDOBRONCHIAL NAVIGATION N/A 04/26/2018   Procedure: VIDEO BRONCHOSCOPY WITH ENDOBRONCHIAL NAVIGATION;  Surgeon: Melrose Nakayama, MD;  Location: [place];  Service: Thoracic;  Laterality: N/A;    Social History:  reports that he quit smoking about 23 years ago. His smoking use included cigarettes. He has a 75.00 pack-year smoking history. He has never used smokeless tobacco. He reports that he does not drink alcohol or use drugs.  Allergies: No Known Allergies  Family History:  Family History  Problem Relation Age of Onset  . Heart failure Mother   . Heart failure Father   . Heart failure Sister   . Heart failure Son     Prior to Admission medications   Medication Sig Start Date End Date Taking?  Authorizing Provider  allopurinol (ZYLOPRIM) 300 MG tablet Take 1 tablet (300 mg total) by mouth daily. 11/19/19   [name], MD  AMITIZA 24 MCG capsule TAKE 1 CAPSULE(24 MCG) BY MOUTH TWICE DAILY WITH A MEAL 11/19/19   Aliene Altes S, PA-C  amLODipine (NORVASC) 10 MG tablet TAKE 1 TABLET(10 MG) BY MOUTH DAILY 11/19/19   [place], [name], MD  Cholecalciferol (VITAMIN D3) 125 MCG (5000 UT) CAPS Take 1 capsule (5,000 Units total) by mouth daily. Patient taking differently: Take 5,000 Units by mouth every morning.  03/08/19   [name], MD  cloNIDine (CATAPRES) 0.2 MG tablet Take 1 tablet (0.2 mg total) by mouth 3 (three) times daily. 11/19/19   [name], MD  diclofenac sodium (VOLTAREN) 1 % GEL Apply twice a day for affected area on ankle as needed 09/18/19   [name], MD  doxycycline (VIBRAMYCIN) 100 MG capsule Take 1 capsule (100 mg total) by mouth 2 (two) times daily. 02/14/20  [place], [name], MD  furosemide (LASIX) 40 MG tablet Take 1 tablet (40 mg total) by mouth daily. 11/19/19   [place], [name], MD  gabapentin (NEURONTIN) 300 MG capsule TAKE 2 CAPSULES BY MOUTH TWICE DAILY, MAY TAKE AN ADDTIONAL 2 CAPSULES MIDDAY AS NEEDED FOR PAIN 02/10/20   [name], MD  insulin glargine (LANTUS SOLOSTAR) 100 UNIT/ML Solostar Pen Inject 42 Units into the skin at bedtime. 02/13/20   Cassandria Anger, MD  Lancets (ONETOUCH DELICA PLUS [id number]) MISC USE TO TEST BLOOD SUGAR FOUR TIMES DAILY 02/27/20   Cassandria Anger, MD  lisinopril (ZESTRIL) 20 MG tablet Take 1 tablet (20 mg total) by mouth daily. 11/19/19   [name], MD  metFORMIN (GLUCOPHAGE) 500 MG tablet TAKE 1 TABLET BY MOUTH TWICE DAILY 11/11/19   [place], Marella Chimes, MD  metoprolol succinate (TOPROL-XL) 50 MG 24 hr tablet TAKE 1 TABLET BY MOUTH EVERY DAY WITH OR IMMEDIATELY FOLLOWING A MEAL 12/09/19   [place], [name], MD  nystatin (MYCOSTATIN/NYSTOP) powder Apply topically 3 (three) times  daily. 07/24/19   [name], MD  [facility] VERIO test strip USE TO CHECK BLOOD SUGAR FOUR TIMES DAILY 11/19/19   Cassandria Anger, MD  oxyCODONE-acetaminophen (PERCOCET) 7.5-325 MG tablet Take 1 tablet by mouth every 4 (four) hours as needed. 01/10/20   [name], MD  pantoprazole (PROTONIX) 40 MG tablet Take 1 tablet (40 mg total) by mouth daily. 11/19/19   [place], [name], MD  potassium chloride SA (KLOR-CON) 20 MEQ tablet TAKE 1 TABLET(20 MEQ) BY MOUTH DAILY 12/30/19   [name], MD  simvastatin (ZOCOR) 20 MG tablet Take 1 tablet (20 mg total) by mouth at bedtime. 11/19/19   [name], MD  tamsulosin (FLOMAX) 0.4 MG CAPS capsule Take 1 capsule (0.4 mg total) by mouth daily. 11/19/19   [place], [name], MD  tiZANidine (ZANAFLEX) 2 MG tablet TAKE 1 TABLET(2 MG) BY MOUTH TWICE DAILY AS NEEDED 08/28/19   , [name], MD  Triamcinolone Acetonide (TRIAMCINOLONE 0.1 % CREAM : EUCERIN) CREA Apply 1 application topically 2 (two) times daily as needed. Patient taking differently: Apply 1 application topically 2 (two) times daily as needed for rash.  02/06/18   [name], MD    Review of Systems:  Negative except as otherwise mentioned in HPI.   Physical Exam: Vitals:   03/03/20 0600 03/03/20 0641 03/03/20 0704 03/03/20 0715  BP: (!) 135/96  (!) 130/97 (!) 132/95  Pulse: (!) 102  99 96  Resp: 17  17 15   Temp:  98.5 F (36.9 C)    TempSrc:  Oral    SpO2: 100%  100% 100%  Weight:  111.8 kg    Height:  6' (1.829 m)      Constitutional: NAD, calm, comfortable; expressing some mid and lower abdominal discomfort, no fever, no chest pain, no nausea, no vomiting.  Son dysarthria and right-sided hemiparesis unchanged from baseline. Eyes: PERRL, lids and conjunctivae normal, no icterus, no nystagmus. ENMT: Mucous membranes are moist. Posterior pharynx clear of any exudate or lesions. Normal dentition.  Neck: normal, supple, no masses, no thyromegaly, no  JVD. Respiratory: clear to auscultation bilaterally, no wheezing, no crackles. Normal respiratory effort. No accessory muscle use.  Cardiovascular: Regular rate and rhythm, no rubs or gallops.  1-2+ edema in his lower extremities appreciated bilaterally.   Abdomen: Mildly distended, vaguely/but diffusely tender to palpation in his anterior aspect mid section and across  lower quadrants bilaterally.  No masses palpated. Bowel sounds positive.  No guarding. Musculoskeletal: no clubbing / cyanosis. No joint deformity upper and lower extremities.   Skin: no rashes, no petechiae; right buttocks pressure injury appreciated; no signs of superimposed infection or drainage appreciated on exam.  This injury was present prior to admission. Neurologic: No new focal deficits appreciated on exam; patient with right hemiparesis, unable to move his upper limb, some dysarthria and decreased muscle strength in his right lower extremity.  Per wife at bedside they have been also description of ongoing wobbly gait and poor balance. (not new) Psychiatric: Alert and oriented x 3. Normal mood.    Labs on Admission: I have personally reviewed the following labs and imaging studies  CBC: Recent Labs  Lab 03/03/20 0017 03/03/20 0500  WBC 9.9  --   NEUTROABS 6.6  --   HGB 10.4* 10.3*  HCT 35.4* 34.2*  MCV 78.3*  --   PLT 311  --    Basic Metabolic Panel: Recent Labs  Lab 03/03/20 0017  NA 141  K 4.8  CL 108  CO2 22  GLUCOSE 211*  BUN 26*  CREATININE 1.80*  CALCIUM 9.7   GFR: Estimated Creatinine Clearance: 47.2 mL/min (A) (by C-G formula based on SCr of 1.8 mg/dL (H)).   Liver Function Tests: Recent Labs  Lab 03/03/20 0017  AST 14*  ALT 11  ALKPHOS 78  BILITOT 0.3  PROT 6.7  ALBUMIN 3.3*   Coagulation Profile: Recent Labs  Lab 03/03/20 0017  INR 1.0   CBG: Recent Labs  Lab 03/03/20 0744  GLUCAP 187*   Urine analysis:    Component Value Date/Time   COLORURINE STRAW (A) 07/15/2019  1337   APPEARANCEUR CLEAR 07/15/2019 1337   LABSPEC 1.009 07/15/2019 1337   PHURINE 7.0 07/15/2019 1337   GLUCOSEU NEGATIVE 07/15/2019 1337   HGBUR NEGATIVE 07/15/2019 1337   BILIRUBINUR NEGATIVE 07/15/2019 1337   KETONESUR NEGATIVE 07/15/2019 1337   PROTEINUR 100 (A) 07/15/2019 1337   UROBILINOGEN 0.2 06/20/2014 0135   NITRITE NEGATIVE 07/15/2019 1337   LEUKOCYTESUR NEGATIVE 07/15/2019 1337    Recent Results (from the past 240 hour(s))  Respiratory Panel by RT PCR (Flu A&B, Covid) - Nasopharyngeal Swab     Status: None   Collection Time: 03/03/20  4:42 AM   Specimen: Nasopharyngeal Swab  Result Value Ref Range Status   SARS Coronavirus 2 by RT PCR NEGATIVE NEGATIVE Final    Comment: (NOTE) SARS-CoV-2 target nucleic acids are NOT DETECTED. The SARS-CoV-2 RNA is generally detectable in upper respiratoy specimens during the acute phase of infection. The lowest concentration of SARS-CoV-2 viral copies this assay can detect is 131 copies/mL. A negative result does not preclude SARS-Cov-2 infection and should not be used as the sole basis for treatment or other patient management decisions. A negative result may occur with  improper specimen collection/handling, submission of specimen other than nasopharyngeal swab, presence of viral mutation(s) within the areas targeted by this assay, and inadequate number of viral copies (<131 copies/mL). A negative result must be combined with clinical observations, patient history, and epidemiological information. The expected result is Negative. Fact Sheet for Patients:  PinkCheek.be Fact Sheet for Healthcare Providers:  GravelBags.it This test is not yet ap proved or cleared by the [place] FDA and  has been authorized for detection and/or diagnosis of SARS-CoV-2 by FDA under an Emergency Use Authorization (EUA). This EUA will remain  in effect (meaning this test can be used)  for  the duration of the COVID-19 declaration under Section 564(b)(1) of the Act, 21 U.S.C. section 360bbb-3(b)(1), unless the authorization is terminated or revoked sooner.    Influenza A by PCR NEGATIVE NEGATIVE Final   Influenza B by PCR NEGATIVE NEGATIVE Final    Comment: (NOTE) The Xpert Xpress SARS-CoV-2/FLU/RSV assay is intended as an aid in  the diagnosis of influenza from Nasopharyngeal swab specimens and  should not be used as a sole basis for treatment. Nasal washings and  aspirates are unacceptable for Xpert Xpress SARS-CoV-2/FLU/RSV  testing. Fact Sheet for Patients: PinkCheek.be Fact Sheet for Healthcare Providers: GravelBags.it This test is not yet approved or cleared by the [place] FDA and  has been authorized for detection and/or diagnosis of SARS-CoV-2 by  FDA under an Emergency Use Authorization (EUA). This EUA will remain  in effect (meaning this test can be used) for the duration of the  Covid-19 declaration under Section 564(b)(1) of the Act, 21  U.S.C. section 360bbb-3(b)(1), unless the authorization is  terminated or revoked. Performed at [facility], [address], [place], [place] [address]   MRSA PCR Screening     Status: None   Collection Time: 03/03/20  6:31 AM   Specimen: Nasal Mucosa; Nasopharyngeal  Result Value Ref Range Status   MRSA by PCR NEGATIVE NEGATIVE Final    Comment:        The GeneXpert MRSA Assay (FDA approved for NASAL specimens only), is one component of a comprehensive MRSA colonization surveillance program. It is not intended to diagnose MRSA infection nor to guide or monitor treatment for MRSA infections. Performed at [facility], [address], [place], [place] [address]      Radiological Exams on Admission: CT ABDOMEN PELVIS WO CONTRAST  Result Date: 03/03/2020 CLINICAL DATA:  Abdominal pain and rectal bleeding EXAM: CT ABDOMEN AND PELVIS WITHOUT CONTRAST  TECHNIQUE: Multidetector CT imaging of the abdomen and pelvis was performed following the standard protocol without IV contrast. COMPARISON:  July 15, 2019 FINDINGS: Lower chest: There is a small pericardial effusion present. A small hiatal hernia is present. The visualized portions of the lungs are clear. Hepatobiliary: Although limited due to the lack of intravenous contrast, normal in appearance without gross focal abnormality. The patient is status post cholecystectomy. No biliary ductal dilation. Pancreas:  Unremarkable.  No surrounding inflammatory changes. Spleen: Normal in size. Although limited due to the lack of intravenous contrast, normal in appearance. Adrenals/Urinary Tract: Both adrenal glands appear normal. Multiple bilateral low-density lesions are seen throughout both kidneys. The largest measuring 5 cm off the lower pole of the left kidney and 4.3 cm off the lower pole of the right kidney which are likely renal cysts. Bladder is unremarkable. Stomach/Bowel: The stomach is mildly prominent and filled with foodstuff and air. The small bowel is normal in appearance. There is scattered colonic diverticula without diverticulitis. A moderate amount of colonic stool is present. Vascular/Lymphatic: There are no enlarged abdominal or pelvic lymph nodes. Scattered aortic atherosclerotic calcifications are seen without aneurysmal dilatation. Reproductive: The prostate is unremarkable. Other: Small fat containing inguinal hernias are present. Musculoskeletal: No acute or significant osseous findings. Prior cement fixation seen within the L1 vertebral body. Again noted are lytic changes and irregularity within the L2 vertebral body as on prior exam, and dating back to 2015. IMPRESSION: No acute intra-abdominal or pelvic pathology to explain the patient's symptoms. Diverticulosis without diverticulitis. Aortic Atherosclerosis (ICD10-I70.0). Small pericardial effusion Bilateral renal cysts. Electronically  Signed  By: Prudencio Pair M.D.   On: 03/03/2020 03:08    EKG: Independently reviewed.  No acute ischemic changes appreciated.  Assessment/Plan 1-acute lower GI bleeding: With concern for ischemic colitis versus diverticulosis -GI service has been consulted and will follow recommendations -Follow hemoglobin trend and transfuse as needed -Discontinue IV Protonix and only use oral daily dosage. -As needed antiemetics and analgesics has been ordered. -Continue supportive care and IV fluids. -Patient was typed and screen. -Clear liquid diet has been allowed.  2-HTN (hypertension) -Blood pressure stable and well-controlled -Continue to monitor and be judicious about antihypertensive regimen.  3-GERD (gastroesophageal reflux disease) -As mentioned above continue oral PPI.  4-HLD (hyperlipidemia) -Resume the use of statins.  5-BPH (benign prostatic hyperplasia) -Continue Flomax  6-AKI (acute kidney injury) ([place]) on CKD (chronic kidney disease), stage IIIa -In the setting of poor perfusion with acute bleeding and continue use of nephrotoxic agents -There is no signs of UTI -Will hold nephrotoxic agents and provide fluid resuscitation -Follow renal function trend.   7-type 2 diabetes with nephropathy -Sliding scale insulin will be use while inpatient -Holding oral hypoglycemic agents.   DVT prophylaxis: SCDs Code Status: Full code Family Communication: Wife at bedside Disposition Plan: To be determined; anticipate discharge home once medically stable and with controlled bleeding process. Consults called: Service Admission status: Inpatient, length of stay more than 2 midnights; stepdown unit.   Time Spent: 70 minutes.  Barton Dubois MD [facility] Pager [phone]   03/03/2020, 7:59 AM

## 2020-03-03 NOTE — Progress Notes (Signed)
Patient alert and oriented x3. Patient has not verbalized to writer any pain, shortness of breath, chest pain, dizziness, nausea or vomiting. Patient tolerated clear liquid diet well. No stools noted since 7am. Patient incontinent of urine at times, but is now tolerating condom cath well. Patient bathed and linens changed. Hemoglobin lab result at 1505 noted to be 7.9. Dr Oneida Alar, K. Jodi Mourning PA and Dr Dyann Kief all made aware. Wife and son in to visit, emotional support provided and all questions answered.

## 2020-03-04 DIAGNOSIS — N1831 Chronic kidney disease, stage 3a: Secondary | ICD-10-CM

## 2020-03-04 DIAGNOSIS — N179 Acute kidney failure, unspecified: Secondary | ICD-10-CM

## 2020-03-04 DIAGNOSIS — R1084 Generalized abdominal pain: Secondary | ICD-10-CM

## 2020-03-04 DIAGNOSIS — K219 Gastro-esophageal reflux disease without esophagitis: Secondary | ICD-10-CM

## 2020-03-04 LAB — GLUCOSE, CAPILLARY
Glucose-Capillary: 119 mg/dL — ABNORMAL HIGH (ref 70–99)
Glucose-Capillary: 124 mg/dL — ABNORMAL HIGH (ref 70–99)
Glucose-Capillary: 133 mg/dL — ABNORMAL HIGH (ref 70–99)
Glucose-Capillary: 126 mg/dL — ABNORMAL HIGH (ref 70–99)
Glucose-Capillary: 127 mg/dL — ABNORMAL HIGH (ref 70–99)

## 2020-03-04 LAB — BASIC METABOLIC PANEL
Anion gap: 7 (ref 5–15)
BUN: 26 mg/dL — ABNORMAL HIGH (ref 8–23)
CO2: 23 mmol/L (ref 22–32)
Calcium: 9.6 mg/dL (ref 8.9–10.3)
Chloride: 110 mmol/L (ref 98–111)
Creatinine, Ser: 1.42 mg/dL — ABNORMAL HIGH (ref 0.61–1.24)
GFR calc Af Amer: 56 mL/min — ABNORMAL LOW (ref >60)
GFR calc non Af Amer: 49 mL/min — ABNORMAL LOW (ref >60)
Glucose, Bld: 115 mg/dL — ABNORMAL HIGH (ref 70–99)
Potassium: 4.2 mmol/L (ref 3.5–5.1)
Sodium: 140 mmol/L (ref 135–145)

## 2020-03-04 LAB — CBC
HCT: 25 % — ABNORMAL LOW (ref 39.0–52.0)
Hemoglobin: 7.5 g/dL — ABNORMAL LOW (ref 13.0–17.0)
MCH: 23.2 pg — ABNORMAL LOW (ref 26.0–34.0)
MCHC: 30 g/dL (ref 30.0–36.0)
MCV: 77.4 fL — ABNORMAL LOW (ref 80.0–100.0)
Platelets: 240 10*3/uL (ref 150–400)
RBC: 3.23 MIL/uL — ABNORMAL LOW (ref 4.22–5.81)
RDW: 17.6 % — ABNORMAL HIGH (ref 11.5–15.5)
WBC: 10.5 10*3/uL (ref 4.0–10.5)
nRBC: 0 % (ref 0.0–0.2)

## 2020-03-04 LAB — HEMOGLOBIN AND HEMATOCRIT, BLOOD
HCT: 25.9 % — ABNORMAL LOW (ref 39.0–52.0)
HCT: 25.2 % — ABNORMAL LOW (ref 39.0–52.0)
Hemoglobin: 7.7 g/dL — ABNORMAL LOW (ref 13.0–17.0)
Hemoglobin: 7.8 g/dL — ABNORMAL LOW (ref 13.0–17.0)

## 2020-03-04 NOTE — Progress Notes (Signed)
Subjective: Abdominal pain better today. No N/V. No bowel movement in the last 24 hours (confirmed with nursing staff) and subsequently no further GI bleed. Tolerating clear liquids, did have some worsening pain with food intake.Feet more swollen then normal. No other overt GI complaints.  Objective: Vital signs in last 24 hours: Temp:  [98 F (36.7 C)-98.6 F (37 C)] 98.2 F (36.8 C) (03/24 0746) Pulse Rate:  [77-142] 110 (03/24 0706) Resp:  [12-22] 13 (03/24 0706) BP: (106-166)/(67-97) 139/89 (03/24 0706) SpO2:  [93 %-100 %] 100 % (03/24 0706) Weight:  [112.6 kg] 112.6 kg (03/24 0500) Last BM Date: 03/02/20 General:   Alert and oriented, pleasant Head:  Normocephalic and atraumatic. Eyes:  No icterus, sclera clear. Conjuctiva pink.  Neck:  Supple, without obvious thyromegaly or masses.  Heart:  S1, S2 present, no murmurs noted.  Lungs: Clear to auscultation bilaterally, without wheezing, rales, or rhonchi.  Abdomen:  Bowel rounded but soft, non-distended. Mild generalized TTP noted. No HSM or hernias noted. No rebound or guarding. No masses appreciated  Msk:  Symmetrical without gross deformities. Pulses:  Normal bilateral DP pulses noted. Extremities:  Without clubbing. Noted 2-3+ bilateral pitting edema of the feet; no significant edema above that. Neurologic:  Alert and  oriented x4;  Stutters with responses intermittently Skin:  Warm and dry, intact without significant lesions.  Cervical Nodes:  No significant cervical adenopathy. Psych:  Alert and cooperative. Normal mood and affect.  Intake/Output from previous day: 03/23 0701 - 03/24 0700 In: 871.8 [P.O.:120; I.V.:751.8] Out: 625 [Urine:625] Intake/Output this shift: No intake/output data recorded.  Lab Results: Recent Labs    03/03/20 0017 03/03/20 0017 03/03/20 0500 03/03/20 0500 03/03/20 0815 03/03/20 0815 03/03/20 1505 03/03/20 1919 03/04/20 0555  WBC 9.9  --   --   --  13.7*  --   --   --  10.5   HGB 10.4*   < > 10.3*   < > 9.3*   < > 7.9* 7.7* 7.5*  HCT 35.4*   < > 34.2*  --  31.5*  --   --   --  25.0*  PLT 311  --   --   --  305  --   --   --  240   < > = values in this interval not displayed.   BMET Recent Labs    03/03/20 0017 03/03/20 0815 03/04/20 0555  NA 141 140 140  K 4.8 5.3* 4.2  CL 108 108 110  CO2 22 24 23   GLUCOSE 211* 198* 115*  BUN 26* 30* 26*  CREATININE 1.80* 1.67* 1.42*  CALCIUM 9.7 9.7 9.6   LFT Recent Labs    03/03/20 0017  PROT 6.7  ALBUMIN 3.3*  AST 14*  ALT 11  ALKPHOS 78  BILITOT 0.3   PT/INR Recent Labs    03/03/20 0017  LABPROT 13.4  INR 1.0   Hepatitis Panel No results for input(s): HEPBSAG, HCVAB, HEPAIGM, HEPBIGM in the last 72 hours.   Studies/Results: CT ABDOMEN PELVIS WO CONTRAST  Result Date: 03/03/2020 CLINICAL DATA:  Abdominal pain and rectal bleeding EXAM: CT ABDOMEN AND PELVIS WITHOUT CONTRAST TECHNIQUE: Multidetector CT imaging of the abdomen and pelvis was performed following the standard protocol without IV contrast. COMPARISON:  July 15, 2019 FINDINGS: Lower chest: There is a small pericardial effusion present. A small hiatal hernia is present. The visualized portions of the lungs are clear. Hepatobiliary: Although limited due to the lack of intravenous contrast,  normal in appearance without gross focal abnormality. The patient is status post cholecystectomy. No biliary ductal dilation. Pancreas:  Unremarkable.  No surrounding inflammatory changes. Spleen: Normal in size. Although limited due to the lack of intravenous contrast, normal in appearance. Adrenals/Urinary Tract: Both adrenal glands appear normal. Multiple bilateral low-density lesions are seen throughout both kidneys. The largest measuring 5 cm off the lower pole of the left kidney and 4.3 cm off the lower pole of the right kidney which are likely renal cysts. Bladder is unremarkable. Stomach/Bowel: The stomach is mildly prominent and filled with foodstuff  and air. The small bowel is normal in appearance. There is scattered colonic diverticula without diverticulitis. A moderate amount of colonic stool is present. Vascular/Lymphatic: There are no enlarged abdominal or pelvic lymph nodes. Scattered aortic atherosclerotic calcifications are seen without aneurysmal dilatation. Reproductive: The prostate is unremarkable. Other: Small fat containing inguinal hernias are present. Musculoskeletal: No acute or significant osseous findings. Prior cement fixation seen within the L1 vertebral body. Again noted are lytic changes and irregularity within the L2 vertebral body as on prior exam, and dating back to 2015. IMPRESSION: No acute intra-abdominal or pelvic pathology to explain the patient's symptoms. Diverticulosis without diverticulitis. Aortic Atherosclerosis (ICD10-I70.0). Small pericardial effusion Bilateral renal cysts. Electronically Signed   By: Prudencio Pair M.D.   On: 03/03/2020 03:08    Assessment: 74 year old male with multiple comorbidities including GERD on Protonix daily, Barrett's esophagus with last EGD in 2017, delayed stomach emptying, hemorrhoids, pancolonic diverticula, tubular adenoma with last complete colonoscopy in 2012, chronic constipation, and hemorrhagic colitis in 2013 presumed infectious.  He presented to the ED with abdominal pain, nausea, vomiting, and bloody diarrhea.  Mild tachycardia but remained hemodynamically stable.  Initial hemoglobin 10.4 (stable ) with microcytic indices, platelets normal, INR 1.0, bump in creatinine at 1.8, lactic acid 2.9.  CT abdomen and pelvis without contrast without acute abnormalities.   Rectal Bleeding: New onset of bloody diarrhea x 2 days with associated generalized abdominal pain, nausea, and vomiting, denies melena. Initial hemoglobin 10.4 (baseline)  but trending down. No obvious colonic abnormalities on CT; however findings are limited due to lack of IV contrast.  Previous colonoscopy in 2017  incomplete due to poor prep.  Colonoscopy in 2012 with tubular adenoma, pancolonic diverticula, and hemorrhoids. Rectal bleeding and abdominal pain seem to be tapering, no further N/V. Started on clears yesterday. Today denies any further rectal bleeding (no BM) in the last 24 hours.   Given symptoms and lactic acid at 2.9, query whether this is ischemic colitis but cannot rule out infectious colitis(recent antibiotics and reports watery diarrhea). Less likely diverticular bleed. Less likely upper GI bleed as he is on protonix daily although can't rule this out with chronic NSAIDs and BUN just slightly elevated at 26. Will eventually need TCS for further evaluation (IP vs. OP) with extended bowel prep.   Anemia: Initial hemoglobin of 10.4 with microcytic indices (at baseline) with history of intermittent mild microcytic anemia over the last several years in the setting of CKD.  Hgb trending down: 10.4 -> 9.3 -> 7.9 -> 7.7 -> 7.5 this morning; suspect equilibration from 3 days bloody stools/IV fluids (no bleeding since yesterday morning). Iron studies yesterday with low iron at 40, normal sat at 19, normal ferritin at 104.  GERD: Chronic. Well controlled on Protonix  Plan: 1. Continue PPI 2. Monitor/trend hgb 3. Transfuse as necessary 4. Supportive measures   Thank you for allowing [place] to participate in the  care of [name]  Walden Field, DNP, AGNP-C Adult & Gerontological Nurse Practitioner [facility]     LOS: 1 day    03/04/2020, 8:15 AM

## 2020-03-04 NOTE — Progress Notes (Signed)
PROGRESS NOTE    [name]  INO:[id number] DOB: 1946-07-01 DOA: 03/02/2020 PCP: [name], MD   Brief Narrative:  Per HPI: [name] is a 74 y.o. male 74 year old male with a past medical history significant for breast esophagus, BPH, parotid gland cancer (adenocarcinoma), chronic abdominal pain, history of CVA with residual right hemiparesis and dysarthria, delayed gastric emptying, type 2 diabetes, GERD, hyperlipidemia and diverticulosis; who presented to the emergency department secondary to rectal bleeding.  Family reports that the patient has not been feeling well for the last 2 to 3 days, specifically reporting sudden worsening of his chronic abdominal pain, vague diffuse mid epigastric region and across lower aspect of his abdomen; around 6 out of 10 in intensity at the [place] and on the day of admission having associated multiple episodes of diarrhea with subsequent feces mixed with blood and then Just frank bloody stools.  Family reports no fever, no new neurologic deficits, no chest pain, no nausea or vomiting, no dysuria, no hematuria, no URI symptoms or other complaints.  3/24: Patient was admitted with for rectal bleeding with concern for ischemic colitis versus diverticulosis.  Appreciate ongoing GI evaluation.  His hemoglobin and hematocrit are still trending and he has stable vital signs with no further bloody bowel movements noted.  Plan to transfuse as needed.  Currently on clear liquid diet and will advance as tolerated.  AKI appears to have resolved.  Plan to transfer to telemetry.  Assessment & Plan:   Principal Problem:   Acute lower GI bleeding Active Problems:   HTN (hypertension)   GERD (gastroesophageal reflux disease)   HLD (hyperlipidemia)   BPH (benign prostatic hyperplasia)   AKI (acute kidney injury) ([place])   CKD (chronic kidney disease), stage III   Anemia   Pressure injury of skin   1-acute lower GI bleeding: With concern for ischemic  colitis versus diverticulosis -GI service has been consulted and will follow recommendations -Follow hemoglobin trend and transfuse as needed with hemoglobin at 7.5 currently -Discontinue IV Protonix and only use oral daily dosage. -As needed antiemetics and analgesics has been ordered. -Clear liquid diet and advance as tolerated  2-HTN (hypertension) -Blood pressure stable and well-controlled -Continue to monitor and be judicious about antihypertensive regimen.  3-GERD (gastroesophageal reflux disease) -As mentioned above continue oral PPI.  4-HLD (hyperlipidemia) -Resume the use of statins.  5-BPH (benign prostatic hyperplasia) -Continue Flomax  6-AKI (acute kidney injury) ([place]) on CKD (chronic kidney disease), stage IIIa-resolved -In the setting of poor perfusion with acute bleeding and continue use of nephrotoxic agents -No further need for IV fluid, continue to advance diet as tolerated -Repeat a.m. labs   7-type 2 diabetes with nephropathy-controlled -Sliding scale insulin will be use while inpatient -Holding oral hypoglycemic agents.   DVT prophylaxis: SCDs Code Status: Full code Family Communication: Plan to call wife Disposition Plan: Transfer to telemetry today.  Continue to monitor hemoglobin and hematocrit trend and transfuse as needed.  Further plans per GI.  Advance diet as tolerated.   Consultants:   GI  Procedures:   See below  Antimicrobials:   None   Subjective: Patient seen and evaluated today with no new acute complaints or concerns. No acute concerns or events noted overnight.  No bowel movements or bleeding noted overnight.  Objective: Vitals:   03/04/20 1000 03/04/20 1100 03/04/20 1200 03/04/20 1204  BP: 129/77 132/74 (!) 163/80   Pulse: 90 86 99   Resp: 11 18 17    Temp:  98.1 F (36.7 C)  TempSrc:    Oral  SpO2: 99% 100% 100%   Weight:      Height:        Intake/Output Summary (Last 24 hours) at 03/04/2020 1343 Last data  filed at 03/04/2020 0600 Gross per 24 hour  Intake 871.77 ml  Output 475 ml  Net 396.77 ml   Filed Weights   03/02/20 2352 03/03/20 0641 03/04/20 0500  Weight: 112.9 kg 111.8 kg 112.6 kg    Examination:  General exam: Appears calm and comfortable  Respiratory system: Clear to auscultation. Respiratory effort normal. Cardiovascular system: S1 & S2 heard, RRR. No JVD, murmurs, rubs, gallops or clicks.  Bilateral lower extremity edema noted with SCDs. Gastrointestinal system: Abdomen is nondistended, soft and nontender. No organomegaly or masses felt. Normal bowel sounds heard. Central nervous system: Alert and oriented. No focal neurological deficits. Extremities: Symmetric 5 x 5 power. Skin: No rashes, lesions or ulcers Psychiatry: Judgement and insight appear normal. Mood & affect appropriate.     Data Reviewed: I have personally reviewed following labs and imaging studies  CBC: Recent Labs  Lab 03/03/20 0017 03/03/20 0017 03/03/20 0500 03/03/20 0815 03/03/20 1505 03/03/20 1919 03/04/20 0555  WBC 9.9  --   --  13.7*  --   --  10.5  NEUTROABS 6.6  --   --   --   --   --   --   HGB 10.4*   < > 10.3* 9.3* 7.9* 7.7* 7.5*  HCT 35.4*  --  34.2* 31.5*  --   --  25.0*  MCV 78.3*  --   --  77.0*  --   --  77.4*  PLT 311  --   --  305  --   --  240   < > = values in this interval not displayed.   Basic Metabolic Panel: Recent Labs  Lab 03/03/20 0017 03/03/20 0815 03/04/20 0555  NA 141 140 140  K 4.8 5.3* 4.2  CL 108 108 110  CO2 22 24 23   GLUCOSE 211* 198* 115*  BUN 26* 30* 26*  CREATININE 1.80* 1.67* 1.42*  CALCIUM 9.7 9.7 9.6   GFR: Estimated Creatinine Clearance: 60 mL/min (A) (by C-G formula based on SCr of 1.42 mg/dL (H)). Liver Function Tests: Recent Labs  Lab 03/03/20 0017  AST 14*  ALT 11  ALKPHOS 78  BILITOT 0.3  PROT 6.7  ALBUMIN 3.3*   No results for input(s): LIPASE, AMYLASE in the last 168 hours. No results for input(s): AMMONIA in the last  168 hours. Coagulation Profile: Recent Labs  Lab 03/03/20 0017  INR 1.0   Cardiac Enzymes: No results for input(s): CKTOTAL, CKMB, CKMBINDEX, TROPONINI in the last 168 hours. BNP (last 3 results) No results for input(s): PROBNP in the last 8760 hours. HbA1C: Recent Labs    03/03/20 0815  HGBA1C 7.4*   CBG: Recent Labs  Lab 03/03/20 1115 03/03/20 1606 03/03/20 2102 03/04/20 0747 03/04/20 1113  GLUCAP 147* 156* 112* 119* 124*   Lipid Profile: No results for input(s): CHOL, HDL, LDLCALC, TRIG, CHOLHDL, LDLDIRECT in the last 72 hours. Thyroid Function Tests: No results for input(s): TSH, T4TOTAL, FREET4, T3FREE, THYROIDAB in the last 72 hours. Anemia Panel: Recent Labs    03/03/20 1505  FERRITIN 104  TIBC 208*  IRON 40*   Sepsis Labs: Recent Labs  Lab 03/03/20 0237  LATICACIDVEN 2.9*    Recent Results (from the past 240 hour(s))  Respiratory Panel  by RT PCR (Flu A&B, Covid) - Nasopharyngeal Swab     Status: None   Collection Time: 03/03/20  4:42 AM   Specimen: Nasopharyngeal Swab  Result Value Ref Range Status   SARS Coronavirus 2 by RT PCR NEGATIVE NEGATIVE Final    Comment: (NOTE) SARS-CoV-2 target nucleic acids are NOT DETECTED. The SARS-CoV-2 RNA is generally detectable in upper respiratoy specimens during the acute phase of infection. The lowest concentration of SARS-CoV-2 viral copies this assay can detect is 131 copies/mL. A negative result does not preclude SARS-Cov-2 infection and should not be used as the sole basis for treatment or other patient management decisions. A negative result may occur with  improper specimen collection/handling, submission of specimen other than nasopharyngeal swab, presence of viral mutation(s) within the areas targeted by this assay, and inadequate number of viral copies (<131 copies/mL). A negative result must be combined with clinical observations, patient history, and epidemiological information. The expected  result is Negative. Fact Sheet for Patients:  PinkCheek.be Fact Sheet for Healthcare Providers:  GravelBags.it This test is not yet ap proved or cleared by the [place] FDA and  has been authorized for detection and/or diagnosis of SARS-CoV-2 by FDA under an Emergency Use Authorization (EUA). This EUA will remain  in effect (meaning this test can be used) for the duration of the COVID-19 declaration under Section 564(b)(1) of the Act, 21 U.S.C. section 360bbb-3(b)(1), unless the authorization is terminated or revoked sooner.    Influenza A by PCR NEGATIVE NEGATIVE Final   Influenza B by PCR NEGATIVE NEGATIVE Final    Comment: (NOTE) The Xpert Xpress SARS-CoV-2/FLU/RSV assay is intended as an aid in  the diagnosis of influenza from Nasopharyngeal swab specimens and  should not be used as a sole basis for treatment. Nasal washings and  aspirates are unacceptable for Xpert Xpress SARS-CoV-2/FLU/RSV  testing. Fact Sheet for Patients: PinkCheek.be Fact Sheet for Healthcare Providers: GravelBags.it This test is not yet approved or cleared by the [place] FDA and  has been authorized for detection and/or diagnosis of SARS-CoV-2 by  FDA under an Emergency Use Authorization (EUA). This EUA will remain  in effect (meaning this test can be used) for the duration of the  Covid-19 declaration under Section 564(b)(1) of the Act, 21  U.S.C. section 360bbb-3(b)(1), unless the authorization is  terminated or revoked. Performed at [facility], [address], [place], [place] [address]   MRSA PCR Screening     Status: None   Collection Time: 03/03/20  6:31 AM   Specimen: Nasal Mucosa; Nasopharyngeal  Result Value Ref Range Status   MRSA by PCR NEGATIVE NEGATIVE Final    Comment:        The GeneXpert MRSA Assay (FDA approved for NASAL specimens only), is one component of  a comprehensive MRSA colonization surveillance program. It is not intended to diagnose MRSA infection nor to guide or monitor treatment for MRSA infections. Performed at [facility], [address], [place], [place] [address]          Radiology Studies: CT ABDOMEN PELVIS WO CONTRAST  Result Date: 03/03/2020 CLINICAL DATA:  Abdominal pain and rectal bleeding EXAM: CT ABDOMEN AND PELVIS WITHOUT CONTRAST TECHNIQUE: Multidetector CT imaging of the abdomen and pelvis was performed following the standard protocol without IV contrast. COMPARISON:  July 15, 2019 FINDINGS: Lower chest: There is a small pericardial effusion present. A small hiatal hernia is present. The visualized portions of the lungs are clear. Hepatobiliary: Although limited due  to the lack of intravenous contrast, normal in appearance without gross focal abnormality. The patient is status post cholecystectomy. No biliary ductal dilation. Pancreas:  Unremarkable.  No surrounding inflammatory changes. Spleen: Normal in size. Although limited due to the lack of intravenous contrast, normal in appearance. Adrenals/Urinary Tract: Both adrenal glands appear normal. Multiple bilateral low-density lesions are seen throughout both kidneys. The largest measuring 5 cm off the lower pole of the left kidney and 4.3 cm off the lower pole of the right kidney which are likely renal cysts. Bladder is unremarkable. Stomach/Bowel: The stomach is mildly prominent and filled with foodstuff and air. The small bowel is normal in appearance. There is scattered colonic diverticula without diverticulitis. A moderate amount of colonic stool is present. Vascular/Lymphatic: There are no enlarged abdominal or pelvic lymph nodes. Scattered aortic atherosclerotic calcifications are seen without aneurysmal dilatation. Reproductive: The prostate is unremarkable. Other: Small fat containing inguinal hernias are present. Musculoskeletal: No acute or significant osseous  findings. Prior cement fixation seen within the L1 vertebral body. Again noted are lytic changes and irregularity within the L2 vertebral body as on prior exam, and dating back to 2015. IMPRESSION: No acute intra-abdominal or pelvic pathology to explain the patient's symptoms. Diverticulosis without diverticulitis. Aortic Atherosclerosis (ICD10-I70.0). Small pericardial effusion Bilateral renal cysts. Electronically Signed   By: Prudencio Pair M.D.   On: 03/03/2020 03:08        Scheduled Meds: . Chlorhexidine Gluconate Cloth  6 each Topical Daily  . insulin aspart  0-5 Units Subcutaneous QHS  . insulin aspart  0-9 Units Subcutaneous TID WC  . metoprolol succinate  25 mg Oral Daily  . pantoprazole  40 mg Oral QAC breakfast  . simvastatin  20 mg Oral QHS  . tamsulosin  0.4 mg Oral QPC supper   Continuous Infusions:   LOS: 1 day    Time spent: 35 minutes    Panhia Karl Darleen Crocker, DO [facility]  If 7PM-7AM, please contact night-coverage [email or web address] 03/04/2020, 1:43 PM

## 2020-03-05 ENCOUNTER — Telehealth: Payer: Self-pay | Admitting: Gastroenterology

## 2020-03-05 ENCOUNTER — Encounter: Payer: Self-pay | Admitting: Internal Medicine

## 2020-03-05 DIAGNOSIS — R1011 Right upper quadrant pain: Secondary | ICD-10-CM

## 2020-03-05 LAB — GLUCOSE, CAPILLARY
Glucose-Capillary: 154 mg/dL — ABNORMAL HIGH (ref 70–99)
Glucose-Capillary: 154 mg/dL — ABNORMAL HIGH (ref 70–99)
Glucose-Capillary: 141 mg/dL — ABNORMAL HIGH (ref 70–99)
Glucose-Capillary: 139 mg/dL — ABNORMAL HIGH (ref 70–99)
Glucose-Capillary: 157 mg/dL — ABNORMAL HIGH (ref 70–99)

## 2020-03-05 LAB — CBC
HCT: 25.3 % — ABNORMAL LOW (ref 39.0–52.0)
Hemoglobin: 7.8 g/dL — ABNORMAL LOW (ref 13.0–17.0)
MCH: 23.9 pg — ABNORMAL LOW (ref 26.0–34.0)
MCHC: 30.8 g/dL (ref 30.0–36.0)
MCV: 77.6 fL — ABNORMAL LOW (ref 80.0–100.0)
Platelets: 209 10*3/uL (ref 150–400)
RBC: 3.26 MIL/uL — ABNORMAL LOW (ref 4.22–5.81)
RDW: 17.7 % — ABNORMAL HIGH (ref 11.5–15.5)
WBC: 10 10*3/uL (ref 4.0–10.5)
nRBC: 0 % (ref 0.0–0.2)

## 2020-03-05 LAB — HEMOGLOBIN AND HEMATOCRIT, BLOOD
HCT: 24 % — ABNORMAL LOW (ref 39.0–52.0)
HCT: 25.3 % — ABNORMAL LOW (ref 39.0–52.0)
Hemoglobin: 7.4 g/dL — ABNORMAL LOW (ref 13.0–17.0)
Hemoglobin: 7.7 g/dL — ABNORMAL LOW (ref 13.0–17.0)

## 2020-03-05 LAB — BASIC METABOLIC PANEL
Anion gap: 8 (ref 5–15)
BUN: 17 mg/dL (ref 8–23)
CO2: 25 mmol/L (ref 22–32)
Calcium: 10.2 mg/dL (ref 8.9–10.3)
Chloride: 108 mmol/L (ref 98–111)
Creatinine, Ser: 1.33 mg/dL — ABNORMAL HIGH (ref 0.61–1.24)
GFR calc Af Amer: >60 mL/min (ref >60)
GFR calc non Af Amer: 53 mL/min — ABNORMAL LOW (ref >60)
Glucose, Bld: 167 mg/dL — ABNORMAL HIGH (ref 70–99)
Potassium: 4.1 mmol/L (ref 3.5–5.1)
Sodium: 141 mmol/L (ref 135–145)

## 2020-03-05 MED ORDER — TRAZODONE HCL 50 MG PO TABS
50.0000 mg | ORAL_TABLET | Freq: Every day | ORAL | Status: DC
  Administered 2020-03-05: 50 mg via ORAL
  Filled 2020-03-05: qty 1

## 2020-03-05 MED ORDER — METOPROLOL TARTRATE 5 MG/5ML IV SOLN
2.5000 mg | Freq: Once | INTRAVENOUS | Status: AC
  Administered 2020-03-05: 2.5 mg via INTRAVENOUS
  Filled 2020-03-05: qty 5

## 2020-03-05 NOTE — Progress Notes (Signed)
Patient had increased heart rate.  EKG done and MD notified.  Other vitals stable.  Received order for iv medication.  Will administer per MD order and will continue to monitor patient.

## 2020-03-05 NOTE — Telephone Encounter (Signed)
Needs hospital follow up in 3-4 weeks for ruq pain, gi bleed/anemia, barrett's with [place], EG, or [facility]

## 2020-03-05 NOTE — Telephone Encounter (Signed)
PATIENT SCHEDULED AND LETTER SENT  °

## 2020-03-05 NOTE — Progress Notes (Addendum)
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 Family reports that the patient has not been feeling well for the last 2 to 3 days, specifically reporting sudden worsening of his chronic abdominal pain, vague diffuse mid epigastric region and across lower aspect of his abdomen; around 6 out of 10 in intensity at the [place] and on the day of admission having associated multiple episodes of diarrhea with subsequent feces mixed with blood and then Justfrank bloody stools. Family reports no fever, no new neurologic deficits, no chest pain, no nausea or vomiting, no dysuria, no hematuria, no URI symptoms or other complaints.  3/24: Patient was admitted with for rectal bleeding with concern for ischemic colitis versus diverticulosis.  Appreciate ongoing GI evaluation.  His hemoglobin and hematocrit are still trending and he has stable vital signs with no further bloody bowel movements noted.  Plan to transfuse as needed.  Currently on clear liquid diet and will advance as tolerated.  AKI appears to have resolved.  Plan to transfer to telemetry.  3/25: Patient has not had any further bleeding and has stable hemoglobin levels noted this morning.  Plans are to advance diet today per GI and recheck CBC in a.m.  If stable, may be a candidate for discharge at that time with outpatient endoscopy follow-up for chronic abdominal pain.  Assessment & Plan:   Principal Problem:   Acute lower GI bleeding Active Problems:   HTN  (hypertension)   GERD (gastroesophageal reflux disease)   HLD (hyperlipidemia)   BPH (benign prostatic hyperplasia)   AKI (acute kidney injury) ([place])   CKD (chronic kidney disease), stage III   Anemia   Pressure injury of skin  1-acute lower GI bleeding:With concern for ischemic colitis versus diverticulosis -GI service has been consulted and will follow recommendations -Follow hemoglobin trend and transfuse as needed with hemoglobin at 7.8 currently -Diet advancement today and recheck CBC in a.m.  2-HTN (hypertension) -Blood pressure stable and well-controlled -Continue to monitor and be judicious about antihypertensive regimen.  3-GERD (gastroesophageal reflux disease) -As mentioned above continue oral PPI.  4-HLD (hyperlipidemia) -Resume the use of statins.  5-BPH (benign prostatic hyperplasia) -Continue Flomax  6-AKI (acute kidney injury) ([facility])onCKD (chronic kidney disease), stage IIIa-resolved -In the setting of poor perfusion with acute bleeding and continue use of nephrotoxic agents -No further need for IV fluid, diet advancement today  7-type 2 diabetes with nephropathy-controlled -Sliding scale insulin will be use while inpatient -Holding oral hypoglycemic agents.   DVT prophylaxis: SCDs Code Status: Full code Family Communication:  Discussed with wife at bedside (cell: [phone]) Disposition Plan:  Diet advancement per GI today and monitoring of H&H in a.m.  If stable, he will be a candidate for discharge in a.m.   Consultants:   GI  Procedures:   See below  Antimicrobials:   None  Subjective: Patient seen and evaluated today with no new acute complaints or concerns. No acute concerns or events noted overnight.  He denies any further bowel movements or bleeding.  Continues to have some chronic abdominal pain as prior.  No nausea or  vomiting.  Objective: Vitals:   03/05/20 0222 03/05/20 0535 03/05/20 0650 03/05/20 0837  BP: (!)  155/99 (!) 158/90 (!) 145/80   Pulse: (!) 106 (!) 115 (!) 104   Resp: 16 20 20    Temp: 98.4 F (36.9 C) 98.4 F (36.9 C) 98.3 F (36.8 C)   TempSrc: Oral     SpO2: 100% 100% 100% 96%  Weight:      Height:        Intake/Output Summary (Last 24 hours) at 03/05/2020 1152 Last data filed at 03/05/2020 1048 Gross per 24 hour  Intake --  Output 3175 ml  Net -3175 ml   Filed Weights   03/02/20 2352 03/03/20 0641 03/04/20 0500  Weight: 112.9 kg 111.8 kg 112.6 kg    Examination:  General exam: Appears calm and comfortable  Respiratory system: Clear to auscultation. Respiratory effort normal. Cardiovascular system: S1 & S2 heard, RRR. No JVD, murmurs, rubs, gallops or clicks. No pedal edema. Gastrointestinal system: Abdomen is nondistended, soft and nontender. No organomegaly or masses felt. Normal bowel sounds heard. Central nervous system: Alert and oriented. No focal neurological deficits. Extremities: Symmetric 5 x 5 power. Skin: No rashes, lesions or ulcers Psychiatry: Judgement and insight appear normal. Mood & affect appropriate.     Data Reviewed: I have personally reviewed following labs and imaging studies  CBC: Recent Labs  Lab 03/03/20 0017 03/03/20 0500 03/03/20 0815 03/03/20 1505 03/04/20 0555 03/04/20 1329 03/04/20 1813 03/05/20 0041 03/05/20 0627  WBC 9.9  --  13.7*  --  10.5  --   --   --  10.0  NEUTROABS 6.6  --   --   --   --   --   --   --   --   HGB 10.4*   < > 9.3*   < > 7.5* 7.7* 7.8* 7.4* 7.8*  7.7*  HCT 35.4*   < > 31.5*  --  25.0* 25.9* 25.2* 24.0* 25.3*  25.3*  MCV 78.3*  --  77.0*  --  77.4*  --   --   --  77.6*  PLT 311  --  305  --  240  --   --   --  209   < > = values in this interval not displayed.   Basic Metabolic Panel: Recent Labs  Lab 03/03/20 0017 03/03/20 0815 03/04/20 0555 03/05/20 0627  NA 141 140 140 141  K 4.8 5.3* 4.2 4.1  CL 108 108 110 108  CO2 22 24 23 25   GLUCOSE 211* 198* 115* 167*  BUN 26* 30* 26* 17   CREATININE 1.80* 1.67* 1.42* 1.33*  CALCIUM 9.7 9.7 9.6 10.2   GFR: Estimated Creatinine Clearance: 64.1 mL/min (A) (by C-G formula based on SCr of 1.33 mg/dL (H)). Liver Function Tests: Recent Labs  Lab 03/03/20 0017  AST 14*  ALT 11  ALKPHOS 78  BILITOT 0.3  PROT 6.7  ALBUMIN 3.3*   No results for input(s): LIPASE, AMYLASE in the last 168 hours. No results for input(s): AMMONIA in the last 168 hours. Coagulation Profile: Recent Labs  Lab 03/03/20 0017  INR 1.0   Cardiac Enzymes: No results for input(s): CKTOTAL, CKMB, CKMBINDEX, TROPONINI in the last 168 hours. BNP (last 3 results) No results for input(s): PROBNP in the last 8760 hours. HbA1C: Recent Labs    03/03/20 0815  HGBA1C 7.4*   CBG: Recent Labs  Lab 03/04/20 1626 03/04/20 2147 03/04/20 2200 03/05/20 0552 03/05/20 0732  GLUCAP  133* 127* 126* 154* 154*   Lipid Profile: No results for input(s): CHOL, HDL, LDLCALC, TRIG, CHOLHDL, LDLDIRECT in the last 72 hours. Thyroid Function Tests: No results for input(s): TSH, T4TOTAL, FREET4, T3FREE, THYROIDAB in the last 72 hours. Anemia Panel: Recent Labs    03/03/20 1505  FERRITIN 104  TIBC 208*  IRON 40*   Sepsis Labs: Recent Labs  Lab 03/03/20 0237  LATICACIDVEN 2.9*    Recent Results (from the past 240 hour(s))  Respiratory Panel by RT PCR (Flu A&B, Covid) - Nasopharyngeal Swab     Status: None   Collection Time: 03/03/20  4:42 AM   Specimen: Nasopharyngeal Swab  Result Value Ref Range Status   SARS Coronavirus 2 by RT PCR NEGATIVE NEGATIVE Final    Comment: (NOTE) SARS-CoV-2 target nucleic acids are NOT DETECTED. The SARS-CoV-2 RNA is generally detectable in upper respiratoy specimens during the acute phase of infection. The lowest concentration of SARS-CoV-2 viral copies this assay can detect is 131 copies/mL. A negative result does not preclude SARS-Cov-2 infection and should not be used as the sole basis for treatment or other patient  management decisions. A negative result may occur with  improper specimen collection/handling, submission of specimen other than nasopharyngeal swab, presence of viral mutation(s) within the areas targeted by this assay, and inadequate number of viral copies (<131 copies/mL). A negative result must be combined with clinical observations, patient history, and epidemiological information. The expected result is Negative. Fact Sheet for Patients:  PinkCheek.be Fact Sheet for Healthcare Providers:  GravelBags.it This test is not yet ap proved or cleared by the [place] FDA and  has been authorized for detection and/or diagnosis of SARS-CoV-2 by FDA under an Emergency Use Authorization (EUA). This EUA will remain  in effect (meaning this test can be used) for the duration of the COVID-19 declaration under Section 564(b)(1) of the Act, 21 U.S.C. section 360bbb-3(b)(1), unless the authorization is terminated or revoked sooner.    Influenza A by PCR NEGATIVE NEGATIVE Final   Influenza B by PCR NEGATIVE NEGATIVE Final    Comment: (NOTE) The Xpert Xpress SARS-CoV-2/FLU/RSV assay is intended as an aid in  the diagnosis of influenza from Nasopharyngeal swab specimens and  should not be used as a sole basis for treatment. Nasal washings and  aspirates are unacceptable for Xpert Xpress SARS-CoV-2/FLU/RSV  testing. Fact Sheet for Patients: PinkCheek.be Fact Sheet for Healthcare Providers: GravelBags.it This test is not yet approved or cleared by the [place] FDA and  has been authorized for detection and/or diagnosis of SARS-CoV-2 by  FDA under an Emergency Use Authorization (EUA). This EUA will remain  in effect (meaning this test can be used) for the duration of the  Covid-19 declaration under Section 564(b)(1) of the Act, 21  U.S.C. section 360bbb-3(b)(1), unless the  authorization is  terminated or revoked. Performed at [facility], [address], [place], [place] [address]   MRSA PCR Screening     Status: None   Collection Time: 03/03/20  6:31 AM   Specimen: Nasal Mucosa; Nasopharyngeal  Result Value Ref Range Status   MRSA by PCR NEGATIVE NEGATIVE Final    Comment:        The GeneXpert MRSA Assay (FDA approved for NASAL specimens only), is one component of a comprehensive MRSA colonization surveillance program. It is not intended to diagnose MRSA infection nor to guide or monitor treatment for MRSA infections. Performed at [facility], [address] [place], [place] [address]  Radiology Studies: No results found.      Scheduled Meds: . Chlorhexidine Gluconate Cloth  6 each Topical Daily  . insulin aspart  0-5 Units Subcutaneous QHS  . insulin aspart  0-9 Units Subcutaneous TID WC  . metoprolol succinate  25 mg Oral Daily  . pantoprazole  40 mg Oral QAC breakfast  . simvastatin  20 mg Oral QHS  . tamsulosin  0.4 mg Oral QPC supper  . traZODone  50 mg Oral QHS   Continuous Infusions:   LOS: 2 days    Time spent: 30 minutes    Cesiah Westley Darleen Crocker, DO [facility]  If 7PM-7AM, please contact night-coverage [email or web address] 03/05/2020, 11:52 AM

## 2020-03-05 NOTE — Progress Notes (Signed)
Subjective:  Complains of chronic RUQ pain. No BMs overnight. No N/V. Doesn't like clear liquids. Wants food. States his chronic ruq pain is worse with meals.   Objective: Vital signs in last 24 hours: Temp:  [98.1 F (36.7 C)-98.5 F (36.9 C)] 98.3 F (36.8 C) (03/25 0650) Pulse Rate:  [86-115] 104 (03/25 0650) Resp:  [11-20] 20 (03/25 0650) BP: (126-163)/(71-99) 145/80 (03/25 0650) SpO2:  [98 %-100 %] 100 % (03/25 0650) Last BM Date: 03/02/20 General:   Alert,  Well-developed, well-nourished, pleasant and cooperative in NAD Head:  Normocephalic and atraumatic. Eyes:  Sclera clear, no icterus.  Abdomen:  Soft, mild epig/ruq tenderness. No masses, hepatosplenomegaly or hernias noted. Normal bowel sounds, without guarding, and without rebound.   Extremities:  2+ pitting edema bilaterally. No deformity or edema. Neurologic:  Alert and  oriented x4;  grossly normal neurologically. Skin:  Intact without significant lesions or rashes. Psych:  Alert and cooperative. Normal mood and affect.  Intake/Output from previous day: 03/24 0701 - 03/25 0700 In: -  Out: 2225 [Urine:2225] Intake/Output this shift: No intake/output data recorded.  Lab Results: CBC Recent Labs    03/03/20 0815 03/03/20 1505 03/04/20 0555 03/04/20 1329 03/04/20 1813 03/05/20 0041 03/05/20 0627  WBC 13.7*  --  10.5  --   --   --  10.0  HGB 9.3*   < > 7.5*   < > 7.8* 7.4* 7.8*  7.7*  HCT 31.5*  --  25.0*   < > 25.2* 24.0* 25.3*  25.3*  MCV 77.0*  --  77.4*  --   --   --  77.6*  PLT 305  --  240  --   --   --  209   < > = values in this interval not displayed.   BMET Recent Labs    03/03/20 0815 03/04/20 0555 03/05/20 0627  NA 140 140 141  K 5.3* 4.2 4.1  CL 108 110 108  CO2 24 23 25   GLUCOSE 198* 115* 167*  BUN 30* 26* 17  CREATININE 1.67* 1.42* 1.33*  CALCIUM 9.7 9.6 10.2   LFTs Recent Labs    03/03/20 0017  BILITOT 0.3  ALKPHOS 78  AST 14*  ALT 11  PROT 6.7  ALBUMIN 3.3*   No  results for input(s): LIPASE in the last 72 hours. PT/INR Recent Labs    03/03/20 0017  LABPROT 13.4  INR 1.0      Imaging Studies: CT ABDOMEN PELVIS WO CONTRAST  Result Date: 03/03/2020 CLINICAL DATA:  Abdominal pain and rectal bleeding EXAM: CT ABDOMEN AND PELVIS WITHOUT CONTRAST TECHNIQUE: Multidetector CT imaging of the abdomen and pelvis was performed following the standard protocol without IV contrast. COMPARISON:  July 15, 2019 FINDINGS: Lower chest: There is a small pericardial effusion present. A small hiatal hernia is present. The visualized portions of the lungs are clear. Hepatobiliary: Although limited due to the lack of intravenous contrast, normal in appearance without gross focal abnormality. The patient is status post cholecystectomy. No biliary ductal dilation. Pancreas:  Unremarkable.  No surrounding inflammatory changes. Spleen: Normal in size. Although limited due to the lack of intravenous contrast, normal in appearance. Adrenals/Urinary Tract: Both adrenal glands appear normal. Multiple bilateral low-density lesions are seen throughout both kidneys. The largest measuring 5 cm off the lower pole of the left kidney and 4.3 cm off the lower pole of the right kidney which are likely renal cysts. Bladder is unremarkable. Stomach/Bowel: The stomach is mildly prominent and  filled with foodstuff and air. The small bowel is normal in appearance. There is scattered colonic diverticula without diverticulitis. A moderate amount of colonic stool is present. Vascular/Lymphatic: There are no enlarged abdominal or pelvic lymph nodes. Scattered aortic atherosclerotic calcifications are seen without aneurysmal dilatation. Reproductive: The prostate is unremarkable. Other: Small fat containing inguinal hernias are present. Musculoskeletal: No acute or significant osseous findings. Prior cement fixation seen within the L1 vertebral body. Again noted are lytic changes and irregularity within the L2  vertebral body as on prior exam, and dating back to 2015. IMPRESSION: No acute intra-abdominal or pelvic pathology to explain the patient's symptoms. Diverticulosis without diverticulitis. Aortic Atherosclerosis (ICD10-I70.0). Small pericardial effusion Bilateral renal cysts. Electronically Signed   By: Prudencio Pair M.D.   On: 03/03/2020 03:08  [2 weeks]   Assessment: 74 year old male with multiple comorbidities including GERD, Barrett's esophagus, delayed stomach emptying, hemorrhoids, pancolonic diverticula, tubular adenoma with last complete colonoscopy in 2012, chronic constipation, and hemorrhagic colitis in 2013 presumed infectious. He presented to the ED with abdominal pain, nausea, vomiting, and bloody diarrhea. Also with acute on chronic RUQ pain.   Rectal Bleeding:New onset of bloody diarrheax 2 dayswith associatedgeneralizedabdominal pain, nausea, and vomiting, denies melena. Initial hemoglobin 10.4 (baseline) but trending down. No obvious colonic abnormalities on CT;however findings are limited due to lack of IV contrast. Previous colonoscopy in 2017 incomplete due to poor prep. Colonoscopy in 2012 with tubular adenoma, pancolonic diverticula, and hemorrhoids. Rectal bleeding and abdominal pain seem to be tapering, no further N/V.requesting advanced diet. Will eventually need TCS for further evaluation with extended bowel prep.   Anemia: Initial hemoglobin of 10.4 with microcytic indices (at baseline) with history of intermittent mild microcytic anemia over the last several years in the setting of CKD. Hgb trending down: 10.4 -> 9.3 -> 7.8 this morning; suspect equilibration from 3 days bloody stools/IV fluids (no bleeding in 2 days. Iron studies yesterday with low iron at 40, normal sat at 19, normal ferritin at 104.  GERD/Barretts:Chronic. Well controlled on Protonix. Due for EGD for Barrett's surveillance.  Chronic RUQ pain: for several years. Extensively evaluated in the  past. Previously felt that pain could be related to musculoskeletal or neuropathic component related posturing or neurological deficit status post right. He also describes a postprandial component, query some relationship to his gastroparesis. Consider further evaluation as outpatient ie evaluate again at time of EGD for Barrett's surveillance.    Plan: 1. Advance diet if tolerated.  2. Consider outpatient follow up to discuss possible EGD/colonoscopy to evaluate anemia, bleeding, barrett's surveillance, ruq pain.  3. Continue ppi.  4. Recheck CBC in AM, possible discharge in next 24 hours.   Laureen Ochs. Bernarda Caffey [facility] [phone] 3/25/202111:13 AM     LOS: 2 days

## 2020-03-05 NOTE — Progress Notes (Signed)
Patient had no bowel movements during night shift.

## 2020-03-06 ENCOUNTER — Telehealth: Payer: Self-pay | Admitting: *Deleted

## 2020-03-06 DIAGNOSIS — I69959 Hemiplegia and hemiparesis following unspecified cerebrovascular disease affecting unspecified side: Secondary | ICD-10-CM

## 2020-03-06 DIAGNOSIS — M6281 Muscle weakness (generalized): Secondary | ICD-10-CM

## 2020-03-06 DIAGNOSIS — K922 Gastrointestinal hemorrhage, unspecified: Secondary | ICD-10-CM

## 2020-03-06 DIAGNOSIS — E440 Moderate protein-calorie malnutrition: Secondary | ICD-10-CM

## 2020-03-06 LAB — GLUCOSE, CAPILLARY
Glucose-Capillary: 161 mg/dL — ABNORMAL HIGH (ref 70–99)
Glucose-Capillary: 183 mg/dL — ABNORMAL HIGH (ref 70–99)

## 2020-03-06 LAB — CBC
HCT: 28.4 % — ABNORMAL LOW (ref 39.0–52.0)
Hemoglobin: 8.6 g/dL — ABNORMAL LOW (ref 13.0–17.0)
MCH: 23.3 pg — ABNORMAL LOW (ref 26.0–34.0)
MCHC: 30.3 g/dL (ref 30.0–36.0)
MCV: 77 fL — ABNORMAL LOW (ref 80.0–100.0)
Platelets: 296 10*3/uL (ref 150–400)
RBC: 3.69 MIL/uL — ABNORMAL LOW (ref 4.22–5.81)
RDW: 17.7 % — ABNORMAL HIGH (ref 11.5–15.5)
WBC: 14.4 10*3/uL — ABNORMAL HIGH (ref 4.0–10.5)
nRBC: 0 % (ref 0.0–0.2)

## 2020-03-06 MED ORDER — LISINOPRIL 10 MG PO TABS
20.0000 mg | ORAL_TABLET | Freq: Every day | ORAL | Status: DC
  Administered 2020-03-06: 09:00:00 20 mg via ORAL
  Filled 2020-03-06: qty 2

## 2020-03-06 MED ORDER — AMLODIPINE BESYLATE 5 MG PO TABS
10.0000 mg | ORAL_TABLET | Freq: Every day | ORAL | Status: DC
  Administered 2020-03-06: 09:00:00 10 mg via ORAL
  Filled 2020-03-06 (×2): qty 2

## 2020-03-06 MED ORDER — CLONIDINE HCL 0.2 MG PO TABS
0.2000 mg | ORAL_TABLET | Freq: Three times a day (TID) | ORAL | Status: DC
  Administered 2020-03-06: 11:00:00 0.2 mg via ORAL
  Filled 2020-03-06: qty 1

## 2020-03-06 MED ORDER — FUROSEMIDE 40 MG PO TABS
40.0000 mg | ORAL_TABLET | Freq: Every day | ORAL | Status: DC
  Administered 2020-03-06: 09:00:00 40 mg via ORAL
  Filled 2020-03-06: qty 1

## 2020-03-06 NOTE — Discharge Summary (Signed)
Physician Discharge Summary  [name] FXO:[id number] DOB: 12/04/1946 DOA: 03/02/2020  PCP: [name], MD  Admit date: 03/02/2020  Discharge date: 03/06/2020  Admitted From:Home  Disposition:  Home  Recommendations for Outpatient Follow-up:  1. Follow up with PCP in 1-2 weeks 2. Please obtain BMP/CBC in one week 3. Continue on PPI daily 4. Continue on home antihypertensive and diabetic regimen as prior 5. Follow-up with GI in 3-4 weeks for consideration for further endoscopy to evaluate abdominal pain.  Will follow up with Dr. Roseanne Kaufman office  [place]: None  Equipment/Devices: None  Discharge Condition: Stable  CODE STATUS: Full  Diet recommendation: Heart Healthy/carb modified  Brief/Interim Summary: Per HPI: [name] a 74 y.o.male89 year old male with a past medical history significant for breast esophagus, BPH, parotid gland cancer (adenocarcinoma), chronic abdominal pain, history of CVA with residual right hemiparesis and dysarthria, delayed gastric emptying, type 2 diabetes, GERD, hyperlipidemia and diverticulosis; who presented to the emergency department secondary to rectal bleeding. Family reports that the patient has not been feeling well for the last 2 to 3 days, specifically reporting sudden worsening of his chronic abdominal pain, vague diffuse mid epigastric region and across lower aspect of his abdomen; around 6 out of 10 in intensity at the [facility] and on the day of admission having associated multiple episodes of diarrhea with subsequent feces mixed with blood and then Justfrank bloody stools. Family reports no fever, no new neurologic deficits, no chest pain, no nausea or vomiting, no dysuria, no hematuria, no URI symptoms or other complaints.  3/24:Patient was admitted with for rectal bleeding with concern for ischemic colitis versus diverticulosis. Appreciate ongoing GI evaluation. His hemoglobin and hematocrit are still trending and he  has stable vital signs with no further bloody bowel movements noted. Plan to transfuse as needed. Currently on clear liquid diet and will advance as tolerated. AKI appears to have resolved. Plan to transfer to telemetry.  3/25: Patient has not had any further bleeding and has stable hemoglobin levels noted this morning.  Plans are to advance diet today per GI and recheck CBC in a.m.  If stable, may be a candidate for discharge at that time with outpatient endoscopy follow-up for chronic abdominal pain.  3/26: Patient has had no further bleeding noted and hemoglobin levels are stable at 8.6 this morning.  He is tolerating diet and stable for discharge from GI standpoint with close follow-up that will be arranged in the next 3-4 weeks.  Blood pressures have been elevated this morning, but he has not been getting most of his home medications which will be resumed this morning.  I have discussed with his wife close monitoring of his blood pressures at home and to call PCP office if there are any concerns.  Discharge Diagnoses:  Principal Problem:   Acute lower GI bleeding Active Problems:   HTN (hypertension)   GERD (gastroesophageal reflux disease)   HLD (hyperlipidemia)   BPH (benign prostatic hyperplasia)   AKI (acute kidney injury) ([place])   CKD (chronic kidney disease), stage III   Anemia   Pressure injury of skin    Discharge Instructions  Discharge Instructions    Diet - low sodium heart healthy   Complete by: As directed    Increase activity slowly   Complete by: As directed      Allergies as of 03/06/2020   No Known Allergies     Medication List    TAKE these medications   allopurinol 300 MG  tablet Commonly known as: ZYLOPRIM Take 1 tablet (300 mg total) by mouth daily.   Amitiza 24 MCG capsule Generic drug: lubiprostone TAKE 1 CAPSULE(24 MCG) BY MOUTH TWICE DAILY WITH A MEAL What changed: See the new instructions.   amLODipine 10 MG tablet Commonly known as:  NORVASC TAKE 1 TABLET(10 MG) BY MOUTH DAILY What changed:   how much to take  how to take this  when to take this   cloNIDine 0.2 MG tablet Commonly known as: CATAPRES Take 1 tablet (0.2 mg total) by mouth 3 (three) times daily.   diclofenac sodium 1 % Gel Commonly known as: Voltaren Apply twice a day for affected area on ankle as needed What changed:   how much to take  how to take this  when to take this  reasons to take this   furosemide 40 MG tablet Commonly known as: LASIX Take 1 tablet (40 mg total) by mouth daily.   gabapentin 300 MG capsule Commonly known as: NEURONTIN TAKE 2 CAPSULES BY MOUTH TWICE DAILY, MAY TAKE AN ADDTIONAL 2 CAPSULES MIDDAY AS NEEDED FOR PAIN What changed: See the new instructions.   Lantus SoloStar 100 UNIT/ML Solostar Pen Generic drug: insulin glargine Inject 42 Units into the skin at bedtime.   lisinopril 20 MG tablet Commonly known as: ZESTRIL Take 1 tablet (20 mg total) by mouth daily.   metFORMIN 500 MG tablet Commonly known as: GLUCOPHAGE TAKE 1 TABLET BY MOUTH TWICE DAILY What changed: when to take this   metoprolol succinate 50 MG 24 hr tablet Commonly known as: TOPROL-XL TAKE 1 TABLET BY MOUTH EVERY DAY WITH OR IMMEDIATELY FOLLOWING A MEAL What changed: See the new instructions.   nystatin powder Commonly known as: MYCOSTATIN/NYSTOP Apply topically 3 (three) times daily.   OneTouch Delica Plus [id number] Misc USE TO TEST BLOOD SUGAR FOUR TIMES DAILY What changed: See the new instructions.   OneTouch Verio test strip Generic drug: glucose blood USE TO CHECK BLOOD SUGAR FOUR TIMES DAILY What changed: See the new instructions.   oxyCODONE-acetaminophen 7.5-325 MG tablet Commonly known as: Percocet Take 1 tablet by mouth every 4 (four) hours as needed.   pantoprazole 40 MG tablet Commonly known as: PROTONIX Take 1 tablet (40 mg total) by mouth daily.   potassium chloride SA 20 MEQ tablet Commonly known as:  KLOR-CON TAKE 1 TABLET(20 MEQ) BY MOUTH DAILY What changed: See the new instructions.   simvastatin 20 MG tablet Commonly known as: ZOCOR Take 1 tablet (20 mg total) by mouth at bedtime.   tamsulosin 0.4 MG Caps capsule Commonly known as: FLOMAX Take 1 capsule (0.4 mg total) by mouth daily.   tiZANidine 2 MG tablet Commonly known as: ZANAFLEX TAKE 1 TABLET(2 MG) BY MOUTH TWICE DAILY AS NEEDED What changed: See the new instructions.   triamcinolone 0.1 % cream : eucerin Crea Apply 1 application topically 2 (two) times daily as needed. What changed: reasons to take this   Vitamin D3 125 MCG (5000 UT) Caps Take 1 capsule (5,000 Units total) by mouth daily. What changed: when to take this      [place], [name], MD Follow up in 1 week(s).   Specialty: Family Medicine Contact information: [address] [place] [address] [phone]        Daneil Dolin, MD Follow up in 3 week(s).   Specialty: Gastroenterology Contact information: [address] [place] [address] [phone]  No Known Allergies  Consultations:  GI   Procedures/Studies: CT ABDOMEN PELVIS WO CONTRAST  Result Date: 03/03/2020 CLINICAL DATA:  Abdominal pain and rectal bleeding EXAM: CT ABDOMEN AND PELVIS WITHOUT CONTRAST TECHNIQUE: Multidetector CT imaging of the abdomen and pelvis was performed following the standard protocol without IV contrast. COMPARISON:  July 15, 2019 FINDINGS: Lower chest: There is a small pericardial effusion present. A small hiatal hernia is present. The visualized portions of the lungs are clear. Hepatobiliary: Although limited due to the lack of intravenous contrast, normal in appearance without gross focal abnormality. The patient is status post cholecystectomy. No biliary ductal dilation. Pancreas:  Unremarkable.  No surrounding inflammatory changes. Spleen: Normal in size. Although limited due to the lack of  intravenous contrast, normal in appearance. Adrenals/Urinary Tract: Both adrenal glands appear normal. Multiple bilateral low-density lesions are seen throughout both kidneys. The largest measuring 5 cm off the lower pole of the left kidney and 4.3 cm off the lower pole of the right kidney which are likely renal cysts. Bladder is unremarkable. Stomach/Bowel: The stomach is mildly prominent and filled with foodstuff and air. The small bowel is normal in appearance. There is scattered colonic diverticula without diverticulitis. A moderate amount of colonic stool is present. Vascular/Lymphatic: There are no enlarged abdominal or pelvic lymph nodes. Scattered aortic atherosclerotic calcifications are seen without aneurysmal dilatation. Reproductive: The prostate is unremarkable. Other: Small fat containing inguinal hernias are present. Musculoskeletal: No acute or significant osseous findings. Prior cement fixation seen within the L1 vertebral body. Again noted are lytic changes and irregularity within the L2 vertebral body as on prior exam, and dating back to 2015. IMPRESSION: No acute intra-abdominal or pelvic pathology to explain the patient's symptoms. Diverticulosis without diverticulitis. Aortic Atherosclerosis (ICD10-I70.0). Small pericardial effusion Bilateral renal cysts. Electronically Signed   By: Prudencio Pair M.D.   On: 03/03/2020 03:08     Discharge Exam: Vitals:   03/06/20 0527 03/06/20 0831  BP: (!) 186/119   Pulse: (!) 118   Resp: 16   Temp: 99 F (37.2 C)   SpO2: 100% 98%   Vitals:   03/05/20 1528 03/05/20 2019 03/06/20 0527 03/06/20 0831  BP: (!) 162/97 (!) 180/102 (!) 186/119   Pulse:  (!) 107 (!) 118   Resp:  16 16   Temp:  98.9 F (37.2 C) 99 F (37.2 C)   TempSrc:  Oral Oral   SpO2:  100% 100% 98%  Weight:      Height:        General: Pt is alert, awake, not in acute distress Cardiovascular: RRR, S1/S2 +, no rubs, no gallops Respiratory: CTA bilaterally, no wheezing,  no rhonchi Abdominal: Soft, NT, ND, bowel sounds + Extremities: no edema, no cyanosis    The results of significant diagnostics from this hospitalization (including imaging, microbiology, ancillary and laboratory) are listed below for reference.     Microbiology: Recent Results (from the past 240 hour(s))  Respiratory Panel by RT PCR (Flu A&B, Covid) - Nasopharyngeal Swab     Status: None   Collection Time: 03/03/20  4:42 AM   Specimen: Nasopharyngeal Swab  Result Value Ref Range Status   SARS Coronavirus 2 by RT PCR NEGATIVE NEGATIVE Final    Comment: (NOTE) SARS-CoV-2 target nucleic acids are NOT DETECTED. The SARS-CoV-2 RNA is generally detectable in upper respiratoy specimens during the acute phase of infection. The lowest concentration of SARS-CoV-2 viral copies this assay can detect is 131 copies/mL. A negative result does  not preclude SARS-Cov-2 infection and should not be used as the sole basis for treatment or other patient management decisions. A negative result may occur with  improper specimen collection/handling, submission of specimen other than nasopharyngeal swab, presence of viral mutation(s) within the areas targeted by this assay, and inadequate number of viral copies (<131 copies/mL). A negative result must be combined with clinical observations, patient history, and epidemiological information. The expected result is Negative. Fact Sheet for Patients:  PinkCheek.be Fact Sheet for Healthcare Providers:  GravelBags.it This test is not yet ap proved or cleared by the [place] FDA and  has been authorized for detection and/or diagnosis of SARS-CoV-2 by FDA under an Emergency Use Authorization (EUA). This EUA will remain  in effect (meaning this test can be used) for the duration of the COVID-19 declaration under Section 564(b)(1) of the Act, 21 U.S.C. section 360bbb-3(b)(1), unless the authorization  is terminated or revoked sooner.    Influenza A by PCR NEGATIVE NEGATIVE Final   Influenza B by PCR NEGATIVE NEGATIVE Final    Comment: (NOTE) The Xpert Xpress SARS-CoV-2/FLU/RSV assay is intended as an aid in  the diagnosis of influenza from Nasopharyngeal swab specimens and  should not be used as a sole basis for treatment. Nasal washings and  aspirates are unacceptable for Xpert Xpress SARS-CoV-2/FLU/RSV  testing. Fact Sheet for Patients: PinkCheek.be Fact Sheet for Healthcare Providers: GravelBags.it This test is not yet approved or cleared by the [place] FDA and  has been authorized for detection and/or diagnosis of SARS-CoV-2 by  FDA under an Emergency Use Authorization (EUA). This EUA will remain  in effect (meaning this test can be used) for the duration of the  Covid-19 declaration under Section 564(b)(1) of the Act, 21  U.S.C. section 360bbb-3(b)(1), unless the authorization is  terminated or revoked. Performed at [facility], [address], [place], [place] [address]   MRSA PCR Screening     Status: None   Collection Time: 03/03/20  6:31 AM   Specimen: Nasal Mucosa; Nasopharyngeal  Result Value Ref Range Status   MRSA by PCR NEGATIVE NEGATIVE Final    Comment:        The GeneXpert MRSA Assay (FDA approved for NASAL specimens only), is one component of a comprehensive MRSA colonization surveillance program. It is not intended to diagnose MRSA infection nor to guide or monitor treatment for MRSA infections. Performed at [facility], [address], [place],  [address]      Labs: BNP (last 3 results) No results for input(s): BNP in the last 8760 hours. Basic Metabolic Panel: Recent Labs  Lab 03/03/20 0017 03/03/20 0815 03/04/20 0555 03/05/20 0627  NA 141 140 140 141  K 4.8 5.3* 4.2 4.1  CL 108 108 110 108  CO2 22 24 23 25   GLUCOSE 211* 198* 115* 167*  BUN 26* 30* 26* 17   CREATININE 1.80* 1.67* 1.42* 1.33*  CALCIUM 9.7 9.7 9.6 10.2   Liver Function Tests: Recent Labs  Lab 03/03/20 0017  AST 14*  ALT 11  ALKPHOS 78  BILITOT 0.3  PROT 6.7  ALBUMIN 3.3*   No results for input(s): LIPASE, AMYLASE in the last 168 hours. No results for input(s): AMMONIA in the last 168 hours. CBC: Recent Labs  Lab 03/03/20 0017 03/03/20 0500 03/03/20 0815 03/03/20 1505 03/04/20 0555 03/04/20 0555 03/04/20 1329 03/04/20 1813 03/05/20 0041 03/05/20 0627 03/06/20 0458  WBC 9.9  --  13.7*  --  10.5  --   --   --   --  10.0 14.4*  NEUTROABS 6.6  --   --   --   --   --   --   --   --   --   --   HGB 10.4*   < > 9.3*   < > 7.5*   < > 7.7* 7.8* 7.4* 7.8*  7.7* 8.6*  HCT 35.4*   < > 31.5*  --  25.0*   < > 25.9* 25.2* 24.0* 25.3*  25.3* 28.4*  MCV 78.3*  --  77.0*  --  77.4*  --   --   --   --  77.6* 77.0*  PLT 311  --  305  --  240  --   --   --   --  209 296   < > = values in this interval not displayed.   Cardiac Enzymes: No results for input(s): CKTOTAL, CKMB, CKMBINDEX, TROPONINI in the last 168 hours. BNP: Invalid input(s): POCBNP CBG: Recent Labs  Lab 03/05/20 0732 03/05/20 1156 03/05/20 1629 03/05/20 2020 03/06/20 0712  GLUCAP 154* 141* 139* 157* 161*   D-Dimer No results for input(s): DDIMER in the last 72 hours. Hgb A1c No results for input(s): HGBA1C in the last 72 hours. Lipid Profile No results for input(s): CHOL, HDL, LDLCALC, TRIG, CHOLHDL, LDLDIRECT in the last 72 hours. Thyroid function studies No results for input(s): TSH, T4TOTAL, T3FREE, THYROIDAB in the last 72 hours.  Invalid input(s): FREET3 Anemia work up Recent Labs    03/03/20 1505  FERRITIN 104  TIBC 208*  IRON 40*   Urinalysis    Component Value Date/Time   COLORURINE STRAW (A) 07/15/2019 1337   APPEARANCEUR CLEAR 07/15/2019 1337   LABSPEC 1.009 07/15/2019 1337   PHURINE 7.0 07/15/2019 1337   GLUCOSEU NEGATIVE 07/15/2019 1337   HGBUR NEGATIVE 07/15/2019 1337    BILIRUBINUR NEGATIVE 07/15/2019 1337   KETONESUR NEGATIVE 07/15/2019 1337   PROTEINUR 100 (A) 07/15/2019 1337   UROBILINOGEN 0.2 06/20/2014 0135   NITRITE NEGATIVE 07/15/2019 1337   LEUKOCYTESUR NEGATIVE 07/15/2019 1337   Sepsis Labs Invalid input(s): PROCALCITONIN,  WBC,  LACTICIDVEN Microbiology Recent Results (from the past 240 hour(s))  Respiratory Panel by RT PCR (Flu A&B, Covid) - Nasopharyngeal Swab     Status: None   Collection Time: 03/03/20  4:42 AM   Specimen: Nasopharyngeal Swab  Result Value Ref Range Status   SARS Coronavirus 2 by RT PCR NEGATIVE NEGATIVE Final    Comment: (NOTE) SARS-CoV-2 target nucleic acids are NOT DETECTED. The SARS-CoV-2 RNA is generally detectable in upper respiratoy specimens during the acute phase of infection. The lowest concentration of SARS-CoV-2 viral copies this assay can detect is 131 copies/mL. A negative result does not preclude SARS-Cov-2 infection and should not be used as the sole basis for treatment or other patient management decisions. A negative result may occur with  improper specimen collection/handling, submission of specimen other than nasopharyngeal swab, presence of viral mutation(s) within the areas targeted by this assay, and inadequate number of viral copies (<131 copies/mL). A negative result must be combined with clinical observations, patient history, and epidemiological information. The expected result is Negative. Fact Sheet for Patients:  PinkCheek.be Fact Sheet for Healthcare Providers:  GravelBags.it This test is not yet ap proved or cleared by the [place] FDA and  has been authorized for detection and/or diagnosis of SARS-CoV-2 by FDA under an Emergency Use Authorization (EUA). This EUA will remain  in effect (meaning this test can be used) for the  duration of the COVID-19 declaration under Section 564(b)(1) of the Act, 21 U.S.C. section  360bbb-3(b)(1), unless the authorization is terminated or revoked sooner.    Influenza A by PCR NEGATIVE NEGATIVE Final   Influenza B by PCR NEGATIVE NEGATIVE Final    Comment: (NOTE) The Xpert Xpress SARS-CoV-2/FLU/RSV assay is intended as an aid in  the diagnosis of influenza from Nasopharyngeal swab specimens and  should not be used as a sole basis for treatment. Nasal washings and  aspirates are unacceptable for Xpert Xpress SARS-CoV-2/FLU/RSV  testing. Fact Sheet for Patients: PinkCheek.be Fact Sheet for Healthcare Providers: GravelBags.it This test is not yet approved or cleared by the [place] FDA and  has been authorized for detection and/or diagnosis of SARS-CoV-2 by  FDA under an Emergency Use Authorization (EUA). This EUA will remain  in effect (meaning this test can be used) for the duration of the  Covid-19 declaration under Section 564(b)(1) of the Act, 21  U.S.C. section 360bbb-3(b)(1), unless the authorization is  terminated or revoked. Performed at [facility], [address] [place], [place] [address]   MRSA PCR Screening     Status: None   Collection Time: 03/03/20  6:31 AM   Specimen: Nasal Mucosa; Nasopharyngeal  Result Value Ref Range Status   MRSA by PCR NEGATIVE NEGATIVE Final    Comment:        The GeneXpert MRSA Assay (FDA approved for NASAL specimens only), is one component of a comprehensive MRSA colonization surveillance program. It is not intended to diagnose MRSA infection nor to guide or monitor treatment for MRSA infections. Performed at [facility], [address] [place], [place] [address]      Time coordinating discharge: 35 minutes  SIGNED:   Rodena Goldmann, DO [facility] 03/06/2020, 9:13 AM  If 7PM-7AM, please contact night-coverage [email or web address]

## 2020-03-06 NOTE — Telephone Encounter (Signed)
Received call from patient wife [name].   Reports that patient was in hospital for GI bleed.   States that he was released and is noted extremely weak. Reports that he was picking up his leg, but is unable to do so at this time. Reports that he is very unsteady and she is concerned he may fall.   Inquired as to if there is any food or medication to help hime regain his strength.   Also requested to talk with MD regarding his care.   MD please advise.

## 2020-03-06 NOTE — Progress Notes (Signed)
BP and pulse have been elevated and Dr. Manuella Ghazi has been restarting home bp medications and rechecking blood pressure before discharge.  Last  BP after adding clonidine was 135/89, pulse 120.  Texted Dr. Manuella Ghazi with these values.  IV's removed and discharge instructions reviewed with wife who has been at bedside.  Scripts sent to pharmacy.

## 2020-03-06 NOTE — Telephone Encounter (Signed)
Spoke with patient's wife.  He was discharged from the hospital today but was not sent home with any services.  States that it took 3 people to even get him into the car and she had to call multiple family members to get him back into the house.  He is extremely weak.  He had GI bleed hemoglobin dropped down to low sevens but rebounded back to 8.6 at discharge.  He did not require transfusion.  Concerned that he may have upper GI bleed and will plan for endoscopy in a couple of weeks.  He is not even able to help transfer to his chair to bed like he could before.   Needs labs check next Tuesday or Wednesday for CBC metabolic panel.  He has not been eating very much since he was in the hospital wife is going to give him boost twice a day and try to encourage more p.o. intake and fluids.  We will set up physical therapy to see if he can get him at least back to his baseline to help transfer himself.

## 2020-03-06 NOTE — Care Management Important Message (Signed)
Important Message  Patient Details  Name: [name] MRN: [id number] Date of Birth: [birth year]   Medicare Important Message Given:  Yes     Tommy Medal 03/06/2020, 11:39 AM

## 2020-03-07 DIAGNOSIS — R58 Hemorrhage, not elsewhere classified: Secondary | ICD-10-CM | POA: Diagnosis not present

## 2020-03-07 DIAGNOSIS — R197 Diarrhea, unspecified: Secondary | ICD-10-CM | POA: Diagnosis not present

## 2020-03-07 DIAGNOSIS — R5381 Other malaise: Secondary | ICD-10-CM | POA: Diagnosis not present

## 2020-03-09 ENCOUNTER — Other Ambulatory Visit: Payer: Self-pay | Admitting: *Deleted

## 2020-03-09 NOTE — Patient Outreach (Signed)
[facility]) Care Management  03/09/2020  TEVIN SHILLINGFORD [birth year] [id number]  EMMI-GENERAL DISCHARGE RED ON EMMI ALERT Day # 1 Date:03/08/2020 Red Alert Reason: Questions/Concerns  Outreach #1 RN spoke with pt's spouse [name] due to pt's residual effects from a history of a stroke. Caregiver states pt has difficulty with mobility when attempting to ambulate through the home. Stats she has spoken with pt's provider and home health for  PT services has been ordered. States the agency has confirmed an upcoming appointments this week.  PLAN: No additional questions or concerns with no further needs to address at this time.  Raina Mina, RN Care Management Coordinator [place] Office [phone]

## 2020-03-10 ENCOUNTER — Telehealth: Payer: Self-pay | Admitting: *Deleted

## 2020-03-10 DIAGNOSIS — E1140 Type 2 diabetes mellitus with diabetic neuropathy, unspecified: Secondary | ICD-10-CM | POA: Diagnosis not present

## 2020-03-10 DIAGNOSIS — K2270 Barrett's esophagus without dysplasia: Secondary | ICD-10-CM | POA: Diagnosis not present

## 2020-03-10 DIAGNOSIS — M109 Gout, unspecified: Secondary | ICD-10-CM | POA: Diagnosis not present

## 2020-03-10 DIAGNOSIS — K7581 Nonalcoholic steatohepatitis (NASH): Secondary | ICD-10-CM | POA: Diagnosis not present

## 2020-03-10 DIAGNOSIS — I10 Essential (primary) hypertension: Secondary | ICD-10-CM | POA: Diagnosis not present

## 2020-03-10 DIAGNOSIS — K5790 Diverticulosis of intestine, part unspecified, without perforation or abscess without bleeding: Secondary | ICD-10-CM | POA: Diagnosis not present

## 2020-03-10 DIAGNOSIS — E1122 Type 2 diabetes mellitus with diabetic chronic kidney disease: Secondary | ICD-10-CM | POA: Diagnosis not present

## 2020-03-10 DIAGNOSIS — K625 Hemorrhage of anus and rectum: Secondary | ICD-10-CM | POA: Diagnosis not present

## 2020-03-10 DIAGNOSIS — E785 Hyperlipidemia, unspecified: Secondary | ICD-10-CM | POA: Diagnosis not present

## 2020-03-10 DIAGNOSIS — I69322 Dysarthria following cerebral infarction: Secondary | ICD-10-CM | POA: Diagnosis not present

## 2020-03-10 DIAGNOSIS — I69351 Hemiplegia and hemiparesis following cerebral infarction affecting right dominant side: Secondary | ICD-10-CM | POA: Diagnosis not present

## 2020-03-10 DIAGNOSIS — D631 Anemia in chronic kidney disease: Secondary | ICD-10-CM | POA: Diagnosis not present

## 2020-03-10 DIAGNOSIS — N1831 Chronic kidney disease, stage 3a: Secondary | ICD-10-CM | POA: Diagnosis not present

## 2020-03-10 NOTE — Telephone Encounter (Signed)
Received call from [place] [facility] with [place] [phone] telephone.   Requested VO to extend [facility] SN services 1x weekly x4 weeks for nursing assistance. VO given.   Reports that she did obtain blood this AM, but is unsure if there is enough. States that if not, she will re-draw on next visit.

## 2020-03-12 DIAGNOSIS — I69351 Hemiplegia and hemiparesis following cerebral infarction affecting right dominant side: Secondary | ICD-10-CM | POA: Diagnosis not present

## 2020-03-12 DIAGNOSIS — E1122 Type 2 diabetes mellitus with diabetic chronic kidney disease: Secondary | ICD-10-CM | POA: Diagnosis not present

## 2020-03-12 DIAGNOSIS — I69322 Dysarthria following cerebral infarction: Secondary | ICD-10-CM | POA: Diagnosis not present

## 2020-03-12 DIAGNOSIS — M109 Gout, unspecified: Secondary | ICD-10-CM | POA: Diagnosis not present

## 2020-03-12 DIAGNOSIS — K7581 Nonalcoholic steatohepatitis (NASH): Secondary | ICD-10-CM | POA: Diagnosis not present

## 2020-03-12 DIAGNOSIS — D631 Anemia in chronic kidney disease: Secondary | ICD-10-CM | POA: Diagnosis not present

## 2020-03-12 DIAGNOSIS — E785 Hyperlipidemia, unspecified: Secondary | ICD-10-CM | POA: Diagnosis not present

## 2020-03-12 DIAGNOSIS — E1140 Type 2 diabetes mellitus with diabetic neuropathy, unspecified: Secondary | ICD-10-CM | POA: Diagnosis not present

## 2020-03-12 DIAGNOSIS — K2270 Barrett's esophagus without dysplasia: Secondary | ICD-10-CM | POA: Diagnosis not present

## 2020-03-12 DIAGNOSIS — K625 Hemorrhage of anus and rectum: Secondary | ICD-10-CM | POA: Diagnosis not present

## 2020-03-12 DIAGNOSIS — K5790 Diverticulosis of intestine, part unspecified, without perforation or abscess without bleeding: Secondary | ICD-10-CM | POA: Diagnosis not present

## 2020-03-12 DIAGNOSIS — I10 Essential (primary) hypertension: Secondary | ICD-10-CM | POA: Diagnosis not present

## 2020-03-12 DIAGNOSIS — N1831 Chronic kidney disease, stage 3a: Secondary | ICD-10-CM | POA: Diagnosis not present

## 2020-03-16 ENCOUNTER — Telehealth: Payer: Self-pay | Admitting: Family Medicine

## 2020-03-16 DIAGNOSIS — K7581 Nonalcoholic steatohepatitis (NASH): Secondary | ICD-10-CM | POA: Diagnosis not present

## 2020-03-16 DIAGNOSIS — E1140 Type 2 diabetes mellitus with diabetic neuropathy, unspecified: Secondary | ICD-10-CM | POA: Diagnosis not present

## 2020-03-16 DIAGNOSIS — E785 Hyperlipidemia, unspecified: Secondary | ICD-10-CM | POA: Diagnosis not present

## 2020-03-16 DIAGNOSIS — D631 Anemia in chronic kidney disease: Secondary | ICD-10-CM | POA: Diagnosis not present

## 2020-03-16 DIAGNOSIS — K5790 Diverticulosis of intestine, part unspecified, without perforation or abscess without bleeding: Secondary | ICD-10-CM | POA: Diagnosis not present

## 2020-03-16 DIAGNOSIS — I69351 Hemiplegia and hemiparesis following cerebral infarction affecting right dominant side: Secondary | ICD-10-CM | POA: Diagnosis not present

## 2020-03-16 DIAGNOSIS — K2270 Barrett's esophagus without dysplasia: Secondary | ICD-10-CM | POA: Diagnosis not present

## 2020-03-16 DIAGNOSIS — E1122 Type 2 diabetes mellitus with diabetic chronic kidney disease: Secondary | ICD-10-CM | POA: Diagnosis not present

## 2020-03-16 DIAGNOSIS — K625 Hemorrhage of anus and rectum: Secondary | ICD-10-CM | POA: Diagnosis not present

## 2020-03-16 DIAGNOSIS — M109 Gout, unspecified: Secondary | ICD-10-CM | POA: Diagnosis not present

## 2020-03-16 DIAGNOSIS — I10 Essential (primary) hypertension: Secondary | ICD-10-CM | POA: Diagnosis not present

## 2020-03-16 DIAGNOSIS — N1831 Chronic kidney disease, stage 3a: Secondary | ICD-10-CM | POA: Diagnosis not present

## 2020-03-16 DIAGNOSIS — I69322 Dysarthria following cerebral infarction: Secondary | ICD-10-CM | POA: Diagnosis not present

## 2020-03-16 NOTE — Telephone Encounter (Signed)
Spoke with pt wife, CBC showed Hb 7.8 HR does go up to 90's when he tries to stand and exert himself He has not had any further dark stools and blood clots His appetite is improved  PT is working with him now on transferring him  BMET was unable to be drawn.  This will be drawn tomorrow per his wife.  At this time since he is actually stable at home will defer sending him back to the emergency room.  We will try to move up his GI appointment

## 2020-03-17 ENCOUNTER — Telehealth: Payer: Self-pay | Admitting: *Deleted

## 2020-03-17 DIAGNOSIS — D631 Anemia in chronic kidney disease: Secondary | ICD-10-CM | POA: Diagnosis not present

## 2020-03-17 DIAGNOSIS — K7581 Nonalcoholic steatohepatitis (NASH): Secondary | ICD-10-CM | POA: Diagnosis not present

## 2020-03-17 DIAGNOSIS — E785 Hyperlipidemia, unspecified: Secondary | ICD-10-CM | POA: Diagnosis not present

## 2020-03-17 DIAGNOSIS — E1122 Type 2 diabetes mellitus with diabetic chronic kidney disease: Secondary | ICD-10-CM | POA: Diagnosis not present

## 2020-03-17 DIAGNOSIS — K2270 Barrett's esophagus without dysplasia: Secondary | ICD-10-CM | POA: Diagnosis not present

## 2020-03-17 DIAGNOSIS — N1831 Chronic kidney disease, stage 3a: Secondary | ICD-10-CM | POA: Diagnosis not present

## 2020-03-17 DIAGNOSIS — I69351 Hemiplegia and hemiparesis following cerebral infarction affecting right dominant side: Secondary | ICD-10-CM | POA: Diagnosis not present

## 2020-03-17 DIAGNOSIS — I10 Essential (primary) hypertension: Secondary | ICD-10-CM | POA: Diagnosis not present

## 2020-03-17 DIAGNOSIS — M109 Gout, unspecified: Secondary | ICD-10-CM | POA: Diagnosis not present

## 2020-03-17 DIAGNOSIS — K5790 Diverticulosis of intestine, part unspecified, without perforation or abscess without bleeding: Secondary | ICD-10-CM | POA: Diagnosis not present

## 2020-03-17 DIAGNOSIS — K625 Hemorrhage of anus and rectum: Secondary | ICD-10-CM | POA: Diagnosis not present

## 2020-03-17 DIAGNOSIS — I69322 Dysarthria following cerebral infarction: Secondary | ICD-10-CM | POA: Diagnosis not present

## 2020-03-17 DIAGNOSIS — E1140 Type 2 diabetes mellitus with diabetic neuropathy, unspecified: Secondary | ICD-10-CM | POA: Diagnosis not present

## 2020-03-17 NOTE — Telephone Encounter (Signed)
Call placed to [facility]. Advised that office will contact patient and wife to re-schedule appointment.   Faxed results to [phone].

## 2020-03-17 NOTE — Telephone Encounter (Signed)
Agree with above 

## 2020-03-17 NOTE — Telephone Encounter (Signed)
Received call from [place] PT with [place] [phone] telephone.   Requested VO to extend [facility] PT 2x weekly x3 weeks, and then 1x weekly x3 weeks for strengthening and transfers. Reports that goal is to return patient to baseline prior to hospitalization.  VO given.

## 2020-03-18 ENCOUNTER — Other Ambulatory Visit: Payer: Self-pay

## 2020-03-18 ENCOUNTER — Ambulatory Visit (INDEPENDENT_AMBULATORY_CARE_PROVIDER_SITE_OTHER): Payer: Medicare Other | Admitting: Gastroenterology

## 2020-03-18 ENCOUNTER — Encounter: Payer: Self-pay | Admitting: Gastroenterology

## 2020-03-18 VITALS — BP 125/83 | HR 86 | Temp 96.9°F | Ht 72.0 in

## 2020-03-18 DIAGNOSIS — D126 Benign neoplasm of colon, unspecified: Secondary | ICD-10-CM | POA: Diagnosis not present

## 2020-03-18 DIAGNOSIS — K922 Gastrointestinal hemorrhage, unspecified: Secondary | ICD-10-CM | POA: Diagnosis not present

## 2020-03-18 DIAGNOSIS — D649 Anemia, unspecified: Secondary | ICD-10-CM | POA: Diagnosis not present

## 2020-03-18 DIAGNOSIS — K2270 Barrett's esophagus without dysplasia: Secondary | ICD-10-CM

## 2020-03-18 NOTE — Patient Instructions (Signed)
1. I will review labs from yesterday and discuss possible colonoscopy and upper endoscopy with admission to hospital to help with bowel prep. We will be in touch soon. 2. Call if you have any questions or concerns in the interim.

## 2020-03-18 NOTE — Progress Notes (Signed)
Primary Care Physician: [name], MD  Primary Gastroenterologist:  Garfield Cornea, MD   Chief Complaint  Patient presents with  . GI Bleeding    hospital f/u, last bm was dark    HPI: [name] is a 74 y.o. male with history of stroke with residual right-sided hemiparesis, hepatic steatosis, renal insufficiency, non-small cell lung cancer, lymphoma, adenocarcinoma of the parotid gland, Barrett's esophagus, diabetes, GERD, gastroparesis here for hospital follow-up for GI bleed/anemia, Barrett's.  Patient was hospitalized last month.  Reported abdominal pain, diarrhea, bloody stools.  No black stools.  Associated nausea and vomiting.  He has a history of chronic intermittent right upper quadrant abdominal pain which has been extensively evaluated in the past.  Recent antibiotic exposure, doxycycline for abscess on his back.  Takes Advil several times per week.  Initial hemoglobin was 10.4 which was stable from baseline, microcytic indices noted.  CT abdomen pelvis without contrast without acute abnormalities.  Hemoglobin trended down to 7.4 during hospitalization. At time of discharge his hemoglobin was 8.6.  No blood transfusions during admission.  Iron was slightly low, normal ferritin, low TIBC, normal B12 and folate.  It was suspected that he had ischemic colitis.  Last attempted colonoscopy in December 2017, inadequate bowel prep precluded exam.  Last complete colonoscopy April 2012, pancolonic diverticula, hemorrhoids, tubular adenoma.  Last EGD December 2017 with large amount of retained gastric contents precluding complete examination of the stomach.  Mucosal changes in the stomach, erosions and somewhat scalloped appearing mucosa present, reactive gastritis, no H. pylori.  Barrett's esophagus noted, no dysplasia.  Duodenal biopsies taken which were benign with no evidence of eosinophilia.  Patient had labs on March 10, 2020 through home health.  Hemoglobin 7.8,  hematocrit 23.8, MCV 76, white blood cell count 15,600, platelets 321,000.  He had repeat labs yesterday that we requested.  Day after discharge he had some recurrent diarrhea, stools were dark but not black.  No fresh blood per rectum or dark red blood.  At this time his last bowel movement was 3 days ago.  He takes Amitiza about once daily, does not take twice daily due to the expense.  Took a dose of MiraLAX over the weekend but no results.  Denies any nausea or vomiting.  Appetite remains good.  He has chronic right upper quadrant pain which is present most of the time now.  Extensively evaluated in the past and felt that it could be related to musculoskeletal or neuropathic component related to posturing or neurological deficit from previous stroke.  Patient's wife remains concerned about recent worsening anemia, bleeding, worsening right upper quadrant pain.  We discussed possibility of colonoscopy and upper endoscopy at length.  Patient and patient's children desire further work-up.  We discussed the difficulties of bowel prep in the setting of right hemiparesis.  At this time he does not get up to the bedside commode, for the most part his wife is changing his depends.   Current Outpatient Medications  Medication Sig Dispense Refill  . allopurinol (ZYLOPRIM) 300 MG tablet Take 1 tablet (300 mg total) by mouth daily. 90 tablet 1  . AMITIZA 24 MCG capsule TAKE 1 CAPSULE(24 MCG) BY MOUTH TWICE DAILY WITH A MEAL (Patient taking differently: Take 24 mcg by mouth daily. ) 60 capsule 2  . amLODipine (NORVASC) 10 MG tablet TAKE 1 TABLET(10 MG) BY MOUTH DAILY (Patient taking differently: Take 10 mg by mouth daily. TAKE 1 TABLET(10 MG) BY  MOUTH DAILY) 90 tablet 1  . Cholecalciferol (VITAMIN D3) 125 MCG (5000 UT) CAPS Take 1 capsule (5,000 Units total) by mouth daily. (Patient taking differently: Take 5,000 Units by mouth every morning. ) 90 capsule 0  . cloNIDine (CATAPRES) 0.2 MG tablet Take 1 tablet (0.2  mg total) by mouth 3 (three) times daily. 270 tablet 2  . diclofenac sodium (VOLTAREN) 1 % GEL Apply twice a day for affected area on ankle as needed (Patient taking differently: Apply 4 g topically 2 (two) times daily as needed. Apply twice a day for affected area on ankle as needed) 100 g 1  . furosemide (LASIX) 40 MG tablet Take 1 tablet (40 mg total) by mouth daily. 90 tablet 2  . gabapentin (NEURONTIN) 300 MG capsule TAKE 2 CAPSULES BY MOUTH TWICE DAILY, MAY TAKE AN ADDTIONAL 2 CAPSULES MIDDAY AS NEEDED FOR PAIN (Patient taking differently: Take 600 mg by mouth 3 (three) times daily as needed. TAKE 2 CAPSULES BY MOUTH TWICE DAILY, MAY TAKE AN ADDTIONAL 2 CAPSULES MIDDAY AS NEEDED FOR PAIN) 540 capsule 0  . insulin glargine (LANTUS SOLOSTAR) 100 UNIT/ML Solostar Pen Inject 42 Units into the skin at bedtime. 15 mL 2  . Lancets (ONETOUCH DELICA PLUS [id number]) MISC USE TO TEST BLOOD SUGAR FOUR TIMES DAILY (Patient taking differently: 1 Device by Other route in the morning, at noon, in the evening, and at bedtime. ) 200 each 2  . lisinopril (ZESTRIL) 20 MG tablet Take 1 tablet (20 mg total) by mouth daily. 90 tablet 2  . metFORMIN (GLUCOPHAGE) 500 MG tablet TAKE 1 TABLET BY MOUTH TWICE DAILY (Patient taking differently: Take 500 mg by mouth daily. ) 180 tablet 0  . metoprolol succinate (TOPROL-XL) 50 MG 24 hr tablet TAKE 1 TABLET BY MOUTH EVERY DAY WITH OR IMMEDIATELY FOLLOWING A MEAL 90 tablet 0  . nystatin (MYCOSTATIN/NYSTOP) powder Apply topically 3 (three) times daily. 60 g 2  . ONETOUCH VERIO test strip USE TO CHECK BLOOD SUGAR FOUR TIMES DAILY (Patient taking differently: 1 each by Other route in the morning, at noon, in the evening, and at bedtime. ) 150 strip 5  . oxyCODONE-acetaminophen (PERCOCET) 7.5-325 MG tablet Take 1 tablet by mouth every 4 (four) hours as needed. 180 tablet 0  . pantoprazole (PROTONIX) 40 MG tablet Take 1 tablet (40 mg total) by mouth daily. 90 tablet 2  . potassium  chloride SA (KLOR-CON) 20 MEQ tablet TAKE 1 TABLET(20 MEQ) BY MOUTH DAILY (Patient taking differently: Take 20 mEq by mouth daily. ) 90 tablet 1  . simvastatin (ZOCOR) 20 MG tablet Take 1 tablet (20 mg total) by mouth at bedtime. 90 tablet 2  . tamsulosin (FLOMAX) 0.4 MG CAPS capsule Take 1 capsule (0.4 mg total) by mouth daily. 90 capsule 2  . tiZANidine (ZANAFLEX) 2 MG tablet TAKE 1 TABLET(2 MG) BY MOUTH TWICE DAILY AS NEEDED (Patient taking differently: Take 2 mg by mouth 2 (two) times daily as needed for muscle spasms. ) 180 tablet 2  . Triamcinolone Acetonide (TRIAMCINOLONE 0.1 % CREAM : EUCERIN) CREA Apply 1 application topically 2 (two) times daily as needed. (Patient taking differently: Apply 1 application topically 2 (two) times daily as needed for rash. ) 1 each 2   No current facility-administered medications for this visit.    Allergies as of 03/18/2020  . (No Known Allergies)   Past Medical History:  Diagnosis Date  . Arthritis   . Asthma    "hx of"  .  Barrett's esophagus    EGD 03/23/2011 & EGD 2/09 bx proven  . BPH (benign prostatic hyperplasia)   . Cancer of parotid gland ([place]) 11/23/12   Adenocarcinoma  . Chronic abdominal pain   . Chronic constipation   . Colon polyp 03/23/2011   tubular adenoma, Dr. Gala Romney  . 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 ([place]) 07/15/2011  . CVA (cerebral infarction) 1998   right sided deficit  . Delayed gastric emptying 2018  . Diverticulosis    TCS 03/23/11 pancolonic diverticula &TCS 5/08, pancolonic diverticula  . DM (diabetes mellitus) ([place])   . Edema of lower extremity 12/21/12   bilateral   . Esotropia of left eye   . GERD (gastroesophageal reflux disease)   . Glaucoma (increased eye pressure)   . Gout   . Gout   . Hemorrhagic colitis 06/06/2012.  [name] Hemorrhoids, internal 03/23/2011   tcs by Dr. Gala Romney  . Hepatitis    esosiniphilic, tx with prednisone  . Hiatal hernia   . History of radiation therapy 05/21/18- 05/30/18    Left Lung/ 54 Gy delivered in 3 fractions of 18 Gy. SBRT  . HTN (hypertension)   . Hx of radiation therapy 1974   right base of skull area-lymphoma  . Hyperlipidemia   . Lower facial weakness    Right  . Lymphoma ([place]) 1974   XRT at [facility], right base of skull area  . Neuropathy   . Non-small cell lung cancer ([place]) dx'd 04/26/18  . Peripheral edema    R>L legs  . Rash    chronic, recurrent, R>L legs  . Renal insufficiency   . Steatohepatitis    liver biopsy 2009  . Stroke [facility]) 1998   right hemiparesis/plegia   Past Surgical History:  Procedure Laterality Date  . BIOPSY  12/01/2016   Procedure: BIOPSY;  Surgeon: Daneil Dolin, MD;  Location: [facility] SUITE;  Service: Endoscopy;;  duodenum, gastric, esophagus  . CHOLECYSTECTOMY    . COLONOSCOPY  03/23/11   Dr. Gala Romney  pancolonic diverticula, hemorrhoids, tubular adenoma.. next tcs 03/2016  . COLONOSCOPY WITH PROPOFOL N/A 12/01/2016   inadequate bowel prep precluded exam  . ESOPHAGOGASTRODUODENOSCOPY  02/05/08   goblet cell metaplasia/negative for H.pylori  . ESOPHAGOGASTRODUODENOSCOPY  03/23/11   Dr. Gala Romney, barretts, hiatal hernia  . ESOPHAGOGASTRODUODENOSCOPY (EGD) WITH PROPOFOL N/A 12/01/2016   Dr. Gala Romney: Large amount of retained gastric contents precluded completion of the stomach. Mucosal changes were found in the stomach. Erosions and somewhat scalloped appearing mucosa present, reactive gastritis/no H pylori. Barrett's esophagus noted, no dysplasia on biopsy. Duodenal biopsies taken as well, benign, no evidence of eosinophilia.  . IR FLUORO GUIDED NEEDLE PLC ASPIRATION/INJECTION LOC  12/13/2018  . IR KYPHO LUMBAR INC FX REDUCE BONE BX UNI/BIL CANNULATION INC/IMAGING  12/13/2018  . IR RADIOLOGY PERIPHERAL GUIDED IV START  12/13/2018  . IR US GUIDE VASC ACCESS LEFT  12/13/2018  . MASS BIOPSY  11/01/2012   Procedure: NECK MASS BIOPSY;  Surgeon: Ascencion Dike, MD;  Location: [facility] ORS;  Service: ENT;  Laterality: Right;  Excisional Bx Right Neck  Mass; attempted external jugular cutdown of left side  . PAROTIDECTOMY  11/24/2012   Procedure: PAROTIDECTOMY;  Surgeon: Ascencion Dike, MD;  Location: [facility];  Service: ENT;  Laterality: N/A;  Total parotidectomy  . PLEURECTOMY    . right lymph node removal Right    behind right ear  . Right video-assisted thoracic surgery, pleurectomy, and pleurodesis  2011  .  VIDEO BRONCHOSCOPY WITH ENDOBRONCHIAL NAVIGATION N/A 04/26/2018   Procedure: VIDEO BRONCHOSCOPY WITH ENDOBRONCHIAL NAVIGATION;  Surgeon: Melrose Nakayama, MD;  Location: [facility] OR;  Service: Thoracic;  Laterality: N/A;   Family History  Problem Relation Age of Onset  . Heart failure Mother   . Heart failure Father   . Heart failure Sister   . Heart failure Son   . Colon cancer Neg Hx    Social History   Tobacco Use  . Smoking status: Former Smoker    Packs/day: 3.00    Years: 25.00    Pack years: 75.00    Types: Cigarettes    Quit date: 03/01/1997    Years since quitting: 23.0  . Smokeless tobacco: Never Used  Substance Use Topics  . Alcohol use: No  . Drug use: No    ROS:  General: Negative for anorexia, weight loss, fever, chills, fatigue, +weakness. ENT: Negative for hoarseness, difficulty swallowing , nasal congestion. CV: Negative for chest pain, angina, palpitations, dyspnea on exertion, peripheral edema.  Respiratory: Negative for dyspnea at rest, dyspnea on exertion, cough, sputum, wheezing.  GI: See history of present illness. GU:  Negative for dysuria, hematuria, urinary incontinence, urinary frequency, nocturnal urination.  Endo: Negative for unusual weight change.    Physical Examination:   BP 125/83   Pulse 86   Temp (!) 96.9 F (36.1 C) (Temporal)   Ht 6' (1.829 m)   BMI 33.67 kg/m   General: Well-nourished, well-developed in no acute distress. Accompanied by wife Eyes: No icterus. Mouth: masked Lungs: Clear to auscultation bilaterally.  Heart: Regular rate and rhythm, no murmurs rubs or  gallops.  Abdomen: Bowel sounds are normal, nondistended, no hepatosplenomegaly or masses, no abdominal bruits or hernia , no rebound or guarding.  ruq tenderness Extremities: No lower extremity edema. No clubbing or deformities. Neuro: Alert and oriented x 4   Skin: Warm and dry, no jaundice.   Psych: Alert and cooperative, normal mood and affect.  Labs:  Lab Results  Component Value Date   CREATININE 1.33 (H) 03/05/2020   BUN 17 03/05/2020   NA 141 03/05/2020   K 4.1 03/05/2020   CL 108 03/05/2020   CO2 25 03/05/2020   Lab Results  Component Value Date   WBC 14.4 (H) 03/06/2020   HGB 8.6 (L) 03/06/2020   HCT 28.4 (L) 03/06/2020   MCV 77.0 (L) 03/06/2020   PLT 296 03/06/2020   Lab Results  Component Value Date   IRON 40 (L) 03/03/2020   TIBC 208 (L) 03/03/2020   FERRITIN 104 03/03/2020   Lab Results  Component Value Date   ALT 11 03/03/2020   AST 14 (L) 03/03/2020   ALKPHOS 78 03/03/2020   BILITOT 0.3 03/03/2020     Imaging Studies: CT ABDOMEN PELVIS WO CONTRAST  Result Date: 03/03/2020 CLINICAL DATA:  Abdominal pain and rectal bleeding EXAM: CT ABDOMEN AND PELVIS WITHOUT CONTRAST TECHNIQUE: Multidetector CT imaging of the abdomen and pelvis was performed following the standard protocol without IV contrast. COMPARISON:  July 15, 2019 FINDINGS: Lower chest: There is a small pericardial effusion present. A small hiatal hernia is present. The visualized portions of the lungs are clear. Hepatobiliary: Although limited due to the lack of intravenous contrast, normal in appearance without gross focal abnormality. The patient is status post cholecystectomy. No biliary ductal dilation. Pancreas:  Unremarkable.  No surrounding inflammatory changes. Spleen: Normal in size. Although limited due to the lack of intravenous contrast, normal in appearance. Adrenals/Urinary Tract:  Both adrenal glands appear normal. Multiple bilateral low-density lesions are seen throughout both kidneys.  The largest measuring 5 cm off the lower pole of the left kidney and 4.3 cm off the lower pole of the right kidney which are likely renal cysts. Bladder is unremarkable. Stomach/Bowel: The stomach is mildly prominent and filled with foodstuff and air. The small bowel is normal in appearance. There is scattered colonic diverticula without diverticulitis. A moderate amount of colonic stool is present. Vascular/Lymphatic: There are no enlarged abdominal or pelvic lymph nodes. Scattered aortic atherosclerotic calcifications are seen without aneurysmal dilatation. Reproductive: The prostate is unremarkable. Other: Small fat containing inguinal hernias are present. Musculoskeletal: No acute or significant osseous findings. Prior cement fixation seen within the L1 vertebral body. Again noted are lytic changes and irregularity within the L2 vertebral body as on prior exam, and dating back to 2015. IMPRESSION: No acute intra-abdominal or pelvic pathology to explain the patient's symptoms. Diverticulosis without diverticulitis. Aortic Atherosclerosis (ICD10-I70.0). Small pericardial effusion Bilateral renal cysts. Electronically Signed   By: Prudencio Pair M.D.   On: 03/03/2020 03:08    Impression/plan: Very pleasant 74 year old gentleman with multiple comorbidities as outlined above, Barrett's esophagus, chronic right upper quadrant pain, constipation, colonic adenomas, GI bleeding, gastroparesis presenting for hospital follow-up of recent GI bleeding.  It was felt that he likely had ischemic colitis segmental colitis based on his presentation.  Noncontrast CT was unremarkable.  Bleeding resolved on its own.  Post hospitalization hemoglobin slightly lower.  Had follow-up yesterday, we requested records.  No obvious GI bleeding at home.  We discussed potential for updating colonoscopy along with upper endoscopy predominantly for evaluation of anemia, recent GI bleeding, Barrett's esophagus, right upper quadrant pain.  He  does have a history of colonic adenomas with last complete colonoscopy in 2012.  Attempted upper endoscopy and colonoscopy in 2017 precluded by poor bowel prep and retained gastric contents.  He would be a difficult bowel prep given his gastroparesis, constipation, immobility.  Would likely benefit from inpatient bowel prep assistance.  Will discuss further with Dr. Gala Romney regarding pursuing both upper endoscopy and colonoscopy in this scenario.  Currently constipation and adequately managed.  He is taking Amitiza once daily due to the expense he cannot afford twice daily.  Using MiraLAX as needed but not consistent enough with it.  Would like to try different prescription option for constipation management.

## 2020-03-18 NOTE — Progress Notes (Signed)
Cc'ed to pcp °

## 2020-03-19 DIAGNOSIS — K5790 Diverticulosis of intestine, part unspecified, without perforation or abscess without bleeding: Secondary | ICD-10-CM | POA: Diagnosis not present

## 2020-03-19 DIAGNOSIS — K2270 Barrett's esophagus without dysplasia: Secondary | ICD-10-CM | POA: Diagnosis not present

## 2020-03-19 DIAGNOSIS — I10 Essential (primary) hypertension: Secondary | ICD-10-CM | POA: Diagnosis not present

## 2020-03-19 DIAGNOSIS — M109 Gout, unspecified: Secondary | ICD-10-CM | POA: Diagnosis not present

## 2020-03-19 DIAGNOSIS — E785 Hyperlipidemia, unspecified: Secondary | ICD-10-CM | POA: Diagnosis not present

## 2020-03-19 DIAGNOSIS — E1140 Type 2 diabetes mellitus with diabetic neuropathy, unspecified: Secondary | ICD-10-CM | POA: Diagnosis not present

## 2020-03-19 DIAGNOSIS — I69322 Dysarthria following cerebral infarction: Secondary | ICD-10-CM | POA: Diagnosis not present

## 2020-03-19 DIAGNOSIS — D631 Anemia in chronic kidney disease: Secondary | ICD-10-CM | POA: Diagnosis not present

## 2020-03-19 DIAGNOSIS — N1831 Chronic kidney disease, stage 3a: Secondary | ICD-10-CM | POA: Diagnosis not present

## 2020-03-19 DIAGNOSIS — E1122 Type 2 diabetes mellitus with diabetic chronic kidney disease: Secondary | ICD-10-CM | POA: Diagnosis not present

## 2020-03-19 DIAGNOSIS — K7581 Nonalcoholic steatohepatitis (NASH): Secondary | ICD-10-CM | POA: Diagnosis not present

## 2020-03-19 DIAGNOSIS — K625 Hemorrhage of anus and rectum: Secondary | ICD-10-CM | POA: Diagnosis not present

## 2020-03-19 DIAGNOSIS — I69351 Hemiplegia and hemiparesis following cerebral infarction affecting right dominant side: Secondary | ICD-10-CM | POA: Diagnosis not present

## 2020-03-20 ENCOUNTER — Telehealth: Payer: Self-pay | Admitting: *Deleted

## 2020-03-20 NOTE — Telephone Encounter (Signed)
Received lab results from [facility].   MD reviewed labs and recommendations are as follows: 1. Hgb improved (9.0) 2. F/U on BMET as lab has not resulted   Call placed to [facility]. Will F/U on BMET.  Call placed to patient wife [name]. [place].

## 2020-03-23 NOTE — Telephone Encounter (Signed)
Call placed to patient wife, [name]. Made aware that Hgb has improved.   Call placed to [facility]. Requested results of BMP.

## 2020-03-24 ENCOUNTER — Telehealth: Payer: Self-pay | Admitting: *Deleted

## 2020-03-24 DIAGNOSIS — E1122 Type 2 diabetes mellitus with diabetic chronic kidney disease: Secondary | ICD-10-CM | POA: Diagnosis not present

## 2020-03-24 DIAGNOSIS — E1140 Type 2 diabetes mellitus with diabetic neuropathy, unspecified: Secondary | ICD-10-CM | POA: Diagnosis not present

## 2020-03-24 DIAGNOSIS — K7581 Nonalcoholic steatohepatitis (NASH): Secondary | ICD-10-CM | POA: Diagnosis not present

## 2020-03-24 DIAGNOSIS — N1831 Chronic kidney disease, stage 3a: Secondary | ICD-10-CM | POA: Diagnosis not present

## 2020-03-24 DIAGNOSIS — I69351 Hemiplegia and hemiparesis following cerebral infarction affecting right dominant side: Secondary | ICD-10-CM | POA: Diagnosis not present

## 2020-03-24 DIAGNOSIS — D631 Anemia in chronic kidney disease: Secondary | ICD-10-CM | POA: Diagnosis not present

## 2020-03-24 DIAGNOSIS — I10 Essential (primary) hypertension: Secondary | ICD-10-CM | POA: Diagnosis not present

## 2020-03-24 DIAGNOSIS — M109 Gout, unspecified: Secondary | ICD-10-CM | POA: Diagnosis not present

## 2020-03-24 DIAGNOSIS — E785 Hyperlipidemia, unspecified: Secondary | ICD-10-CM | POA: Diagnosis not present

## 2020-03-24 DIAGNOSIS — K2270 Barrett's esophagus without dysplasia: Secondary | ICD-10-CM | POA: Diagnosis not present

## 2020-03-24 DIAGNOSIS — K625 Hemorrhage of anus and rectum: Secondary | ICD-10-CM | POA: Diagnosis not present

## 2020-03-24 DIAGNOSIS — K5790 Diverticulosis of intestine, part unspecified, without perforation or abscess without bleeding: Secondary | ICD-10-CM | POA: Diagnosis not present

## 2020-03-24 DIAGNOSIS — I69322 Dysarthria following cerebral infarction: Secondary | ICD-10-CM | POA: Diagnosis not present

## 2020-03-24 NOTE — Telephone Encounter (Signed)
  Okay to order chair, does she have the specific chair he has , can she write something in her note for insurance purposes

## 2020-03-24 NOTE — Telephone Encounter (Signed)
Received call from [place], [facility] PT with [place] [phone]~ telephone.   Reports that patient requires an order for a new motorized wheelchair to go to [place] as his current chair is ill fitting and in need of repair. Current chair is also >74 years old.   Ok to order new chair?

## 2020-03-25 DIAGNOSIS — I10 Essential (primary) hypertension: Secondary | ICD-10-CM | POA: Diagnosis not present

## 2020-03-25 DIAGNOSIS — N1831 Chronic kidney disease, stage 3a: Secondary | ICD-10-CM | POA: Diagnosis not present

## 2020-03-25 DIAGNOSIS — K5790 Diverticulosis of intestine, part unspecified, without perforation or abscess without bleeding: Secondary | ICD-10-CM | POA: Diagnosis not present

## 2020-03-25 DIAGNOSIS — D631 Anemia in chronic kidney disease: Secondary | ICD-10-CM | POA: Diagnosis not present

## 2020-03-25 DIAGNOSIS — I69351 Hemiplegia and hemiparesis following cerebral infarction affecting right dominant side: Secondary | ICD-10-CM | POA: Diagnosis not present

## 2020-03-25 DIAGNOSIS — E1122 Type 2 diabetes mellitus with diabetic chronic kidney disease: Secondary | ICD-10-CM | POA: Diagnosis not present

## 2020-03-25 DIAGNOSIS — M109 Gout, unspecified: Secondary | ICD-10-CM | POA: Diagnosis not present

## 2020-03-25 DIAGNOSIS — K7581 Nonalcoholic steatohepatitis (NASH): Secondary | ICD-10-CM | POA: Diagnosis not present

## 2020-03-25 DIAGNOSIS — K2270 Barrett's esophagus without dysplasia: Secondary | ICD-10-CM | POA: Diagnosis not present

## 2020-03-25 DIAGNOSIS — I69322 Dysarthria following cerebral infarction: Secondary | ICD-10-CM | POA: Diagnosis not present

## 2020-03-25 DIAGNOSIS — K625 Hemorrhage of anus and rectum: Secondary | ICD-10-CM | POA: Diagnosis not present

## 2020-03-25 DIAGNOSIS — E785 Hyperlipidemia, unspecified: Secondary | ICD-10-CM | POA: Diagnosis not present

## 2020-03-25 DIAGNOSIS — E1140 Type 2 diabetes mellitus with diabetic neuropathy, unspecified: Secondary | ICD-10-CM | POA: Diagnosis not present

## 2020-03-25 NOTE — Telephone Encounter (Signed)
Call placed to [place] to inquire as to type of chair.   [facility] for [employer].

## 2020-03-26 ENCOUNTER — Telehealth: Payer: Self-pay | Admitting: *Deleted

## 2020-03-26 DIAGNOSIS — K7581 Nonalcoholic steatohepatitis (NASH): Secondary | ICD-10-CM | POA: Diagnosis not present

## 2020-03-26 DIAGNOSIS — N1831 Chronic kidney disease, stage 3a: Secondary | ICD-10-CM | POA: Diagnosis not present

## 2020-03-26 DIAGNOSIS — D631 Anemia in chronic kidney disease: Secondary | ICD-10-CM | POA: Diagnosis not present

## 2020-03-26 DIAGNOSIS — I10 Essential (primary) hypertension: Secondary | ICD-10-CM | POA: Diagnosis not present

## 2020-03-26 DIAGNOSIS — I69322 Dysarthria following cerebral infarction: Secondary | ICD-10-CM | POA: Diagnosis not present

## 2020-03-26 DIAGNOSIS — E1122 Type 2 diabetes mellitus with diabetic chronic kidney disease: Secondary | ICD-10-CM | POA: Diagnosis not present

## 2020-03-26 DIAGNOSIS — E1140 Type 2 diabetes mellitus with diabetic neuropathy, unspecified: Secondary | ICD-10-CM | POA: Diagnosis not present

## 2020-03-26 DIAGNOSIS — K2270 Barrett's esophagus without dysplasia: Secondary | ICD-10-CM | POA: Diagnosis not present

## 2020-03-26 DIAGNOSIS — E785 Hyperlipidemia, unspecified: Secondary | ICD-10-CM | POA: Diagnosis not present

## 2020-03-26 DIAGNOSIS — I69351 Hemiplegia and hemiparesis following cerebral infarction affecting right dominant side: Secondary | ICD-10-CM | POA: Diagnosis not present

## 2020-03-26 DIAGNOSIS — K5790 Diverticulosis of intestine, part unspecified, without perforation or abscess without bleeding: Secondary | ICD-10-CM | POA: Diagnosis not present

## 2020-03-26 DIAGNOSIS — M109 Gout, unspecified: Secondary | ICD-10-CM | POA: Diagnosis not present

## 2020-03-26 DIAGNOSIS — K625 Hemorrhage of anus and rectum: Secondary | ICD-10-CM | POA: Diagnosis not present

## 2020-03-26 NOTE — Telephone Encounter (Signed)
Order to be faxed.

## 2020-03-26 NOTE — Telephone Encounter (Signed)
Patient wife returned call and made aware.

## 2020-03-26 NOTE — Telephone Encounter (Signed)
recived fax from [place] with requested lab results.   Noted patient remains slightly anemic, noted acute on chronic renal failure, and noted K+ elevated at 5.5.  MD made aware and recommendations are as follows: Stop potassium supplement Give more water daily Repeat BMP in (1) week.   Call placed to patient wife, [name]. [place].   Orders faxed to [facility].

## 2020-03-26 NOTE — Telephone Encounter (Signed)
Received call from [place], [facility] PT. Reports that brand name is not needed. States that order is only needed so that patient can be measured again for a new chair.

## 2020-03-27 NOTE — Patient Instructions (Signed)
EP+4AC, CBC results placed in LSL's box.

## 2020-03-30 DIAGNOSIS — K625 Hemorrhage of anus and rectum: Secondary | ICD-10-CM | POA: Diagnosis not present

## 2020-03-30 DIAGNOSIS — D631 Anemia in chronic kidney disease: Secondary | ICD-10-CM | POA: Diagnosis not present

## 2020-03-30 DIAGNOSIS — K2270 Barrett's esophagus without dysplasia: Secondary | ICD-10-CM | POA: Diagnosis not present

## 2020-03-30 DIAGNOSIS — I69351 Hemiplegia and hemiparesis following cerebral infarction affecting right dominant side: Secondary | ICD-10-CM | POA: Diagnosis not present

## 2020-03-30 DIAGNOSIS — K5790 Diverticulosis of intestine, part unspecified, without perforation or abscess without bleeding: Secondary | ICD-10-CM | POA: Diagnosis not present

## 2020-03-30 DIAGNOSIS — K7581 Nonalcoholic steatohepatitis (NASH): Secondary | ICD-10-CM | POA: Diagnosis not present

## 2020-03-30 DIAGNOSIS — N1831 Chronic kidney disease, stage 3a: Secondary | ICD-10-CM | POA: Diagnosis not present

## 2020-03-30 DIAGNOSIS — I69322 Dysarthria following cerebral infarction: Secondary | ICD-10-CM | POA: Diagnosis not present

## 2020-03-30 DIAGNOSIS — M109 Gout, unspecified: Secondary | ICD-10-CM | POA: Diagnosis not present

## 2020-03-30 DIAGNOSIS — E1140 Type 2 diabetes mellitus with diabetic neuropathy, unspecified: Secondary | ICD-10-CM | POA: Diagnosis not present

## 2020-03-30 DIAGNOSIS — E1122 Type 2 diabetes mellitus with diabetic chronic kidney disease: Secondary | ICD-10-CM | POA: Diagnosis not present

## 2020-03-30 DIAGNOSIS — I10 Essential (primary) hypertension: Secondary | ICD-10-CM | POA: Diagnosis not present

## 2020-03-30 DIAGNOSIS — E785 Hyperlipidemia, unspecified: Secondary | ICD-10-CM | POA: Diagnosis not present

## 2020-03-31 ENCOUNTER — Telehealth: Payer: Self-pay | Admitting: Internal Medicine

## 2020-03-31 ENCOUNTER — Encounter: Payer: Self-pay | Admitting: Internal Medicine

## 2020-03-31 DIAGNOSIS — E1140 Type 2 diabetes mellitus with diabetic neuropathy, unspecified: Secondary | ICD-10-CM | POA: Diagnosis not present

## 2020-03-31 DIAGNOSIS — N189 Chronic kidney disease, unspecified: Secondary | ICD-10-CM | POA: Diagnosis not present

## 2020-03-31 DIAGNOSIS — E1122 Type 2 diabetes mellitus with diabetic chronic kidney disease: Secondary | ICD-10-CM | POA: Diagnosis not present

## 2020-03-31 DIAGNOSIS — E875 Hyperkalemia: Secondary | ICD-10-CM | POA: Diagnosis not present

## 2020-03-31 DIAGNOSIS — K922 Gastrointestinal hemorrhage, unspecified: Secondary | ICD-10-CM

## 2020-03-31 DIAGNOSIS — D631 Anemia in chronic kidney disease: Secondary | ICD-10-CM | POA: Diagnosis not present

## 2020-03-31 DIAGNOSIS — I69351 Hemiplegia and hemiparesis following cerebral infarction affecting right dominant side: Secondary | ICD-10-CM | POA: Diagnosis not present

## 2020-03-31 DIAGNOSIS — N1831 Chronic kidney disease, stage 3a: Secondary | ICD-10-CM | POA: Diagnosis not present

## 2020-03-31 DIAGNOSIS — D649 Anemia, unspecified: Secondary | ICD-10-CM

## 2020-03-31 DIAGNOSIS — E785 Hyperlipidemia, unspecified: Secondary | ICD-10-CM | POA: Diagnosis not present

## 2020-03-31 DIAGNOSIS — K2270 Barrett's esophagus without dysplasia: Secondary | ICD-10-CM | POA: Diagnosis not present

## 2020-03-31 DIAGNOSIS — K625 Hemorrhage of anus and rectum: Secondary | ICD-10-CM | POA: Diagnosis not present

## 2020-03-31 DIAGNOSIS — K5790 Diverticulosis of intestine, part unspecified, without perforation or abscess without bleeding: Secondary | ICD-10-CM | POA: Diagnosis not present

## 2020-03-31 DIAGNOSIS — K7581 Nonalcoholic steatohepatitis (NASH): Secondary | ICD-10-CM | POA: Diagnosis not present

## 2020-03-31 DIAGNOSIS — D126 Benign neoplasm of colon, unspecified: Secondary | ICD-10-CM

## 2020-03-31 DIAGNOSIS — N179 Acute kidney failure, unspecified: Secondary | ICD-10-CM | POA: Diagnosis not present

## 2020-03-31 DIAGNOSIS — I10 Essential (primary) hypertension: Secondary | ICD-10-CM | POA: Diagnosis not present

## 2020-03-31 DIAGNOSIS — M109 Gout, unspecified: Secondary | ICD-10-CM | POA: Diagnosis not present

## 2020-03-31 DIAGNOSIS — I69322 Dysarthria following cerebral infarction: Secondary | ICD-10-CM | POA: Diagnosis not present

## 2020-03-31 MED ORDER — LINACLOTIDE 290 MCG PO CAPS: 290.0000 ug | ORAL_CAPSULE | Freq: Every day | ORAL | 1 refills | Status: DC

## 2020-03-31 NOTE — Addendum Note (Signed)
Addended by: Mahala Menghini on: 03/31/2020 02:13 PM   Modules accepted: Orders

## 2020-03-31 NOTE — Progress Notes (Signed)
SEE OTHER PHONE NOTE

## 2020-03-31 NOTE — Telephone Encounter (Signed)
Reviewed labs from 03/17/2020:  BUN 33, creatinine 1.68, white blood cell count 9500, hemoglobin 9.0, hematocrit 29.4, MCV 78, platelets 372,000  Hgb stable to improve since discharge last month.  I am waiting for RMR to return from [facility] to discuss. My need to have anesthesia review case to make sure they are willing to sedate him at our facility prior to bringing him inpatient for bowel prep.   FOR CONSTIPATION HAVE HIM STOP AMITIZA AND TRY LINZESS 290MCG DAILY ON EMPTY STOMACH. I SENT IN RX. HE SHOULD LET us KNOW IF NOT COVERED.

## 2020-03-31 NOTE — Telephone Encounter (Signed)
Pt was seen in the office on 4/7 and wife called today asking when he could get scheduled for his colonoscopy. Please advise. [phone]

## 2020-03-31 NOTE — Telephone Encounter (Signed)
Called and verified name and dob with pt wife.she thanked me for the call and stated she understood and will have him d/c the amatiza and start linzess 268mg tomorrow

## 2020-04-01 DIAGNOSIS — N1831 Chronic kidney disease, stage 3a: Secondary | ICD-10-CM | POA: Diagnosis not present

## 2020-04-01 DIAGNOSIS — I10 Essential (primary) hypertension: Secondary | ICD-10-CM | POA: Diagnosis not present

## 2020-04-01 DIAGNOSIS — I69322 Dysarthria following cerebral infarction: Secondary | ICD-10-CM | POA: Diagnosis not present

## 2020-04-01 DIAGNOSIS — M109 Gout, unspecified: Secondary | ICD-10-CM | POA: Diagnosis not present

## 2020-04-01 DIAGNOSIS — K2270 Barrett's esophagus without dysplasia: Secondary | ICD-10-CM | POA: Diagnosis not present

## 2020-04-01 DIAGNOSIS — E1122 Type 2 diabetes mellitus with diabetic chronic kidney disease: Secondary | ICD-10-CM | POA: Diagnosis not present

## 2020-04-01 DIAGNOSIS — D631 Anemia in chronic kidney disease: Secondary | ICD-10-CM | POA: Diagnosis not present

## 2020-04-01 DIAGNOSIS — K7581 Nonalcoholic steatohepatitis (NASH): Secondary | ICD-10-CM | POA: Diagnosis not present

## 2020-04-01 DIAGNOSIS — E1140 Type 2 diabetes mellitus with diabetic neuropathy, unspecified: Secondary | ICD-10-CM | POA: Diagnosis not present

## 2020-04-01 DIAGNOSIS — K5790 Diverticulosis of intestine, part unspecified, without perforation or abscess without bleeding: Secondary | ICD-10-CM | POA: Diagnosis not present

## 2020-04-01 DIAGNOSIS — E785 Hyperlipidemia, unspecified: Secondary | ICD-10-CM | POA: Diagnosis not present

## 2020-04-01 DIAGNOSIS — I69351 Hemiplegia and hemiparesis following cerebral infarction affecting right dominant side: Secondary | ICD-10-CM | POA: Diagnosis not present

## 2020-04-01 DIAGNOSIS — K625 Hemorrhage of anus and rectum: Secondary | ICD-10-CM | POA: Diagnosis not present

## 2020-04-02 NOTE — Telephone Encounter (Signed)
Received return fax from [employer].   Reports that order for power operated wheelchair will need to be addressed as a new order.   Requested patient to have OT evaluation through [facility] to be measured and determine the best chair for patient. Reports that when OT evaluation is being completed, [facility] can send out ATP to make sure fitting is correct.   Advised that information from ATP and OT evaluation will then be sent to office and Face to Face can be performed with dedicated mobility appointment.   Chart notes, mobility exam, and power mobility device order will then need to be faxed to [facility].   Ok to order OT evaluation?

## 2020-04-02 NOTE — Telephone Encounter (Signed)
Received call from [place], [facility] PT with [facility]. Inquired as to why OT is required vs PT. States that she will F/U with [employer] and will contact office with further recommendations.

## 2020-04-03 ENCOUNTER — Telehealth: Payer: Self-pay | Admitting: *Deleted

## 2020-04-03 NOTE — Telephone Encounter (Signed)
Noted  

## 2020-04-03 NOTE — Telephone Encounter (Signed)
Received lab results from [facility].   Noted that kidney function has improved, but K+ remains elevated (5.4).   PCP made aware and new orders given: Kayexelate 15gm PO x1 dose. Repeat BMET via [facility] SN on Monday/Tuesday.  Call placed to patient wife [name] and she was made aware.   [facility] SN made aware. VO given to [place] to obtain BMET on Monday/Tuesday.

## 2020-04-06 ENCOUNTER — Ambulatory Visit: Payer: Medicare Other | Admitting: Gastroenterology

## 2020-04-06 DIAGNOSIS — K7581 Nonalcoholic steatohepatitis (NASH): Secondary | ICD-10-CM | POA: Diagnosis not present

## 2020-04-06 DIAGNOSIS — E785 Hyperlipidemia, unspecified: Secondary | ICD-10-CM | POA: Diagnosis not present

## 2020-04-06 DIAGNOSIS — I10 Essential (primary) hypertension: Secondary | ICD-10-CM | POA: Diagnosis not present

## 2020-04-06 DIAGNOSIS — N1831 Chronic kidney disease, stage 3a: Secondary | ICD-10-CM | POA: Diagnosis not present

## 2020-04-06 DIAGNOSIS — M109 Gout, unspecified: Secondary | ICD-10-CM | POA: Diagnosis not present

## 2020-04-06 DIAGNOSIS — K5790 Diverticulosis of intestine, part unspecified, without perforation or abscess without bleeding: Secondary | ICD-10-CM | POA: Diagnosis not present

## 2020-04-06 DIAGNOSIS — D631 Anemia in chronic kidney disease: Secondary | ICD-10-CM | POA: Diagnosis not present

## 2020-04-06 DIAGNOSIS — E1140 Type 2 diabetes mellitus with diabetic neuropathy, unspecified: Secondary | ICD-10-CM | POA: Diagnosis not present

## 2020-04-06 DIAGNOSIS — E1122 Type 2 diabetes mellitus with diabetic chronic kidney disease: Secondary | ICD-10-CM | POA: Diagnosis not present

## 2020-04-06 DIAGNOSIS — K2270 Barrett's esophagus without dysplasia: Secondary | ICD-10-CM | POA: Diagnosis not present

## 2020-04-06 DIAGNOSIS — K625 Hemorrhage of anus and rectum: Secondary | ICD-10-CM | POA: Diagnosis not present

## 2020-04-06 DIAGNOSIS — I69322 Dysarthria following cerebral infarction: Secondary | ICD-10-CM | POA: Diagnosis not present

## 2020-04-06 DIAGNOSIS — I69351 Hemiplegia and hemiparesis following cerebral infarction affecting right dominant side: Secondary | ICD-10-CM | POA: Diagnosis not present

## 2020-04-07 ENCOUNTER — Encounter: Payer: Self-pay | Admitting: Family Medicine

## 2020-04-07 DIAGNOSIS — I10 Essential (primary) hypertension: Secondary | ICD-10-CM | POA: Diagnosis not present

## 2020-04-07 DIAGNOSIS — E1122 Type 2 diabetes mellitus with diabetic chronic kidney disease: Secondary | ICD-10-CM | POA: Diagnosis not present

## 2020-04-07 DIAGNOSIS — K7581 Nonalcoholic steatohepatitis (NASH): Secondary | ICD-10-CM | POA: Diagnosis not present

## 2020-04-07 DIAGNOSIS — E785 Hyperlipidemia, unspecified: Secondary | ICD-10-CM | POA: Diagnosis not present

## 2020-04-07 DIAGNOSIS — I69351 Hemiplegia and hemiparesis following cerebral infarction affecting right dominant side: Secondary | ICD-10-CM | POA: Diagnosis not present

## 2020-04-07 DIAGNOSIS — M109 Gout, unspecified: Secondary | ICD-10-CM | POA: Diagnosis not present

## 2020-04-07 DIAGNOSIS — E1140 Type 2 diabetes mellitus with diabetic neuropathy, unspecified: Secondary | ICD-10-CM | POA: Diagnosis not present

## 2020-04-07 DIAGNOSIS — I69322 Dysarthria following cerebral infarction: Secondary | ICD-10-CM | POA: Diagnosis not present

## 2020-04-07 DIAGNOSIS — K5790 Diverticulosis of intestine, part unspecified, without perforation or abscess without bleeding: Secondary | ICD-10-CM | POA: Diagnosis not present

## 2020-04-07 DIAGNOSIS — K625 Hemorrhage of anus and rectum: Secondary | ICD-10-CM | POA: Diagnosis not present

## 2020-04-07 DIAGNOSIS — K2270 Barrett's esophagus without dysplasia: Secondary | ICD-10-CM | POA: Diagnosis not present

## 2020-04-07 DIAGNOSIS — D631 Anemia in chronic kidney disease: Secondary | ICD-10-CM | POA: Diagnosis not present

## 2020-04-07 DIAGNOSIS — N1831 Chronic kidney disease, stage 3a: Secondary | ICD-10-CM | POA: Diagnosis not present

## 2020-04-09 ENCOUNTER — Telehealth: Payer: Self-pay | Admitting: *Deleted

## 2020-04-09 DIAGNOSIS — E785 Hyperlipidemia, unspecified: Secondary | ICD-10-CM | POA: Diagnosis not present

## 2020-04-09 DIAGNOSIS — D631 Anemia in chronic kidney disease: Secondary | ICD-10-CM | POA: Diagnosis not present

## 2020-04-09 DIAGNOSIS — K5790 Diverticulosis of intestine, part unspecified, without perforation or abscess without bleeding: Secondary | ICD-10-CM | POA: Diagnosis not present

## 2020-04-09 DIAGNOSIS — K2270 Barrett's esophagus without dysplasia: Secondary | ICD-10-CM | POA: Diagnosis not present

## 2020-04-09 DIAGNOSIS — M109 Gout, unspecified: Secondary | ICD-10-CM | POA: Diagnosis not present

## 2020-04-09 DIAGNOSIS — I69322 Dysarthria following cerebral infarction: Secondary | ICD-10-CM | POA: Diagnosis not present

## 2020-04-09 DIAGNOSIS — I69351 Hemiplegia and hemiparesis following cerebral infarction affecting right dominant side: Secondary | ICD-10-CM | POA: Diagnosis not present

## 2020-04-09 DIAGNOSIS — N1831 Chronic kidney disease, stage 3a: Secondary | ICD-10-CM | POA: Diagnosis not present

## 2020-04-09 DIAGNOSIS — I10 Essential (primary) hypertension: Secondary | ICD-10-CM | POA: Diagnosis not present

## 2020-04-09 DIAGNOSIS — E1140 Type 2 diabetes mellitus with diabetic neuropathy, unspecified: Secondary | ICD-10-CM | POA: Diagnosis not present

## 2020-04-09 DIAGNOSIS — K7581 Nonalcoholic steatohepatitis (NASH): Secondary | ICD-10-CM | POA: Diagnosis not present

## 2020-04-09 DIAGNOSIS — E1122 Type 2 diabetes mellitus with diabetic chronic kidney disease: Secondary | ICD-10-CM | POA: Diagnosis not present

## 2020-04-09 DIAGNOSIS — K625 Hemorrhage of anus and rectum: Secondary | ICD-10-CM | POA: Diagnosis not present

## 2020-04-09 NOTE — Telephone Encounter (Signed)
Received call from [place], [facility] PT with [facility].   Reports that she did contact [place] in regards to assisting with fit testing with patient. Advised that they will be able to do so.   Also reports that patient is now requiring stage 3 chair to elevate lower extremities due to edema. Was advised that neurological diagnosis will cover stage 3 chair that allows patient to elevated feet and legs.   Also reports that patient is now non-ambulatory. He was able to take about 10 steps previously to assist with toileting, but now is only able to transfer from chair to chair.   Of note, patient has appointment on 04/10/2020 for blisters.

## 2020-04-09 NOTE — Telephone Encounter (Signed)
What exactly do they need form [place]? A new prescription or do they have a specific order form

## 2020-04-09 NOTE — Telephone Encounter (Signed)
noted 

## 2020-04-09 NOTE — Telephone Encounter (Signed)
Received call from patient wife [name].   Reports that patient has blister like areas to R arm.   Call placed to patient wife to inquire. Of note, [facility] PT was available to discuss. Reports that patient has (2) large fluid filled blisters approximately 3 inches above R wrist. States that blisters measure between a quarter size and half dollar size. Patient also has 3 small blisters (pencil eraser size) that have appeared today.  States that areas do not cluster like possible shingles infection.  Appointment scheduled for 04/10/2020 to evaluate. Advised to cover with dry dressing at this time for protection. If blister does burst, use ABTx ointment or A&D ointment, cover with nonstick guaze and dressing.   Patient wife [name] states that the only "new" substance patient has had or been around is Kayexalate.  Advised that skin irritation is not a common side effect of medication.   Of note, labs were unable to be obtained on 04/06/2020. Labs were successfully drawn on 04/07/2020. Janett Billow, [place] contacted [facility] in regards to results and were advised that no results were ready at this time. Advised that they do have (3) days to result labs.   MD to be made aware.

## 2020-04-10 ENCOUNTER — Other Ambulatory Visit: Payer: Self-pay

## 2020-04-10 ENCOUNTER — Encounter: Payer: Self-pay | Admitting: Family Medicine

## 2020-04-10 ENCOUNTER — Ambulatory Visit (INDEPENDENT_AMBULATORY_CARE_PROVIDER_SITE_OTHER): Payer: Medicare Other | Admitting: Family Medicine

## 2020-04-10 VITALS — BP 140/78 | HR 92 | Temp 98.0°F | Resp 16

## 2020-04-10 DIAGNOSIS — S34112D 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, subsequent encounter: Secondary | ICD-10-CM

## 2020-04-10 DIAGNOSIS — Z8673 Personal history of transient ischemic attack (TIA), and cerebral infarction without residual deficits: Secondary | ICD-10-CM | POA: Diagnosis not present

## 2020-04-10 DIAGNOSIS — I69959 Hemiplegia and hemiparesis following unspecified cerebrovascular disease affecting unspecified side: Secondary | ICD-10-CM | POA: Diagnosis not present

## 2020-04-10 DIAGNOSIS — M6281 Muscle weakness (generalized): Secondary | ICD-10-CM | POA: Diagnosis not present

## 2020-04-10 DIAGNOSIS — E875 Hyperkalemia: Secondary | ICD-10-CM

## 2020-04-10 DIAGNOSIS — L89312 Pressure ulcer of right buttock, stage 2: Secondary | ICD-10-CM | POA: Diagnosis not present

## 2020-04-10 DIAGNOSIS — S60821A Blister (nonthermal) of right wrist, initial encounter: Secondary | ICD-10-CM

## 2020-04-10 DIAGNOSIS — R600 Localized edema: Secondary | ICD-10-CM

## 2020-04-10 LAB — CBC WITH DIFFERENTIAL/PLATELET
Absolute Monocytes: 618 cells/uL (ref 200–950)
Basophils Absolute: 72 cells/uL (ref 0–200)
Basophils Relative: 0.7 %
Eosinophils Absolute: 608 cells/uL — ABNORMAL HIGH (ref 15–500)
Eosinophils Relative: 5.9 %
HCT: 31.1 % — ABNORMAL LOW (ref 38.5–50.0)
Hemoglobin: 9.2 g/dL — ABNORMAL LOW (ref 13.2–17.1)
Lymphs Abs: 876 cells/uL (ref 850–3900)
MCH: 22.4 pg — ABNORMAL LOW (ref 27.0–33.0)
MCHC: 29.6 g/dL — ABNORMAL LOW (ref 32.0–36.0)
MCV: 75.9 fL — ABNORMAL LOW (ref 80.0–100.0)
MPV: 10.6 fL (ref 7.5–12.5)
Monocytes Relative: 6 %
Neutro Abs: 8127 cells/uL — ABNORMAL HIGH (ref 1500–7800)
Neutrophils Relative %: 78.9 %
Platelets: 414 10*3/uL — ABNORMAL HIGH (ref 140–400)
RBC: 4.1 10*6/uL — ABNORMAL LOW (ref 4.20–5.80)
RDW: 17.9 % — ABNORMAL HIGH (ref 11.0–15.0)
Total Lymphocyte: 8.5 %
WBC: 10.3 10*3/uL (ref 3.8–10.8)

## 2020-04-10 LAB — BASIC METABOLIC PANEL
BUN/Creatinine Ratio: 13 (calc) (ref 6–22)
BUN: 22 mg/dL (ref 7–25)
CO2: 25 mmol/L (ref 20–32)
Calcium: 10.8 mg/dL — ABNORMAL HIGH (ref 8.6–10.3)
Chloride: 107 mmol/L (ref 98–110)
Creat: 1.67 mg/dL — ABNORMAL HIGH (ref 0.70–1.18)
Glucose, Bld: 105 mg/dL — ABNORMAL HIGH (ref 65–99)
Potassium: 4.2 mmol/L (ref 3.5–5.3)
Sodium: 140 mmol/L (ref 135–146)

## 2020-04-10 MED ORDER — MUPIROCIN 2 % EX OINT: 1.0000 "application " | TOPICAL_OINTMENT | Freq: Two times a day (BID) | CUTANEOUS | 1 refills | Status: DC

## 2020-04-10 MED ORDER — CEPHALEXIN 500 MG PO CAPS: 500.0000 mg | ORAL_CAPSULE | Freq: Two times a day (BID) | ORAL | 0 refills | Status: DC

## 2020-04-10 NOTE — Telephone Encounter (Signed)
Noted  

## 2020-04-10 NOTE — Telephone Encounter (Signed)
Order forms have been placed in your folder.   They will need a face to face visit.

## 2020-04-10 NOTE — Patient Instructions (Addendum)
Take keflex Keep blister covered Nursing orders to be sent for the ulcer and sore on his bottom  Use ointment twice a day to buttocks F/U 4 months

## 2020-04-10 NOTE — Progress Notes (Signed)
Subjective:    Patient ID: [name], male    DOB: [birth year], 74 y.o.   MRN: [id number]  Patient presents for Blisters to R Arm  Pt here with wife, noticed blistering lesions on right wrist and forearm on Tuesday. Wife thought it was due to the kayexalate He does not have any significant feeling this arm due to chronic swelling/hemiplegia No fever, no chills  He does complain of oain in buttocks with bathing, wife noticed some raw spots and redness that have not healed with OTC buttpaste  Needs orders for new wheelchair, has been evaluated by PT/OT   needs Stage 3 chair, to allow feet to be elevated as well, in setting of chronic edema, pt unable to ambulate, transfers with help using sliding board   Hyperkelemia- persistant elevation despite Kayexlat 15g po x 1 dose given last week 4/23, K at that time was still elevated at  5.4. Repeat labs done on 4/27 show potassium of 5.6    Review Of Systems:  GEN- denies fatigue, fever, weight loss,weakness, recent illness HEENT- denies eye drainage, change in vision, nasal discharge, CVS- denies chest pain, palpitations RESP- denies SOB, cough, wheeze ABD- denies N/V, change in stools, abd pain GU- denies dysuria, hematuria, dribbling, incontinence MSK- denies joint pain, muscle aches, injury Neuro- denies headache, dizziness, syncope, seizure activity       Objective:    BP 140/78   Pulse 92   Temp 98 F (36.7 C) (Oral)   Resp 16   SpO2 99%  GEN- NAD, alert and oriented x3 GEN- NAD, alert and oriented x3, sitting in wheelchair HEENT- PERRL, EOMI, non injected sclera, pink conjunctiva, MMM, oropharynx clear Neck- Supple, no thyromegaly, no LAD CVS- RRR, no murmur RESP-CTAB ABD-NABS,soft,chronic TTP RUQ  no rebound, no guarding Skin- erythema with moisture maceration gluteal cleft,  TTP over sacrum, small cracks in skin TTP RIGHT BUTTOCKS  quarter size circular ulceration with erythema( pt unable to feel)  Right wrist -  2 large bulla clear fluid , 3 smaller erythematous cloudy bulla surrounding -right forearm NEURO-  Neuro right hemiplegia , unable to ambulate, able to stand with assistance, decreased tone LE bilat, strength 4/5 LUE  flacid chronically edematous Right forearm/hand Lower ext- decreased ROM, 3/5 LLE , Right LE 1/5 EXT- chronic non pitting  edema Pulses- Radial 2+        Assessment & Plan:      Problem List Items Addressed This Visit      Unprioritized   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)   Hemiplegia of dominant side as late effect following cerebrovascular disease ([name]) (Chronic)    Pt is wheelchair bound, needs new chair in order to maintain quality of life and help assist with ADL'S Due to nature of his neurological state, this can not be achieved with standard wheelchair. Pt unable to ambulate therefore walker or cane is not sufficient  Pt currently has outdated chair and house is equipped for use of chair.  Pt and caregiver, willing and able to operate motorized wheel chair        History of CVA (cerebrovascular accident) - Primary   Leg edema    Chronic edema, chair with elevation of feet will help with edema       Muscle weakness (generalized)   Pressure injury of skin   Relevant Orders   CBC with Differential/Platelet (Completed)    Other Visit Diagnoses    Hyperkalemia  recheck on potassium was normal. expect blood may have been lyzed    Relevant Orders   CBC with Differential/Platelet (Completed)   Basic metabolic panel (Completed)   Blister of right wrist, initial encounter       the smaller ones appear infected, I believe these are friction blisters, he has had on legs, but also Staph found on some blisters, cover with keflex , bandage       Note: This dictation was prepared with Dragon dictation along with smaller phrase technology. Any transcriptional errors that result from this process are unintentional.

## 2020-04-11 DIAGNOSIS — I69322 Dysarthria following cerebral infarction: Secondary | ICD-10-CM | POA: Diagnosis not present

## 2020-04-11 DIAGNOSIS — I10 Essential (primary) hypertension: Secondary | ICD-10-CM | POA: Diagnosis not present

## 2020-04-11 DIAGNOSIS — D631 Anemia in chronic kidney disease: Secondary | ICD-10-CM | POA: Diagnosis not present

## 2020-04-11 DIAGNOSIS — I69351 Hemiplegia and hemiparesis following cerebral infarction affecting right dominant side: Secondary | ICD-10-CM | POA: Diagnosis not present

## 2020-04-11 DIAGNOSIS — K5790 Diverticulosis of intestine, part unspecified, without perforation or abscess without bleeding: Secondary | ICD-10-CM | POA: Diagnosis not present

## 2020-04-11 DIAGNOSIS — N1831 Chronic kidney disease, stage 3a: Secondary | ICD-10-CM | POA: Diagnosis not present

## 2020-04-11 DIAGNOSIS — K2270 Barrett's esophagus without dysplasia: Secondary | ICD-10-CM | POA: Diagnosis not present

## 2020-04-11 DIAGNOSIS — K625 Hemorrhage of anus and rectum: Secondary | ICD-10-CM | POA: Diagnosis not present

## 2020-04-11 DIAGNOSIS — E1122 Type 2 diabetes mellitus with diabetic chronic kidney disease: Secondary | ICD-10-CM | POA: Diagnosis not present

## 2020-04-11 DIAGNOSIS — K7581 Nonalcoholic steatohepatitis (NASH): Secondary | ICD-10-CM | POA: Diagnosis not present

## 2020-04-11 DIAGNOSIS — E1140 Type 2 diabetes mellitus with diabetic neuropathy, unspecified: Secondary | ICD-10-CM | POA: Diagnosis not present

## 2020-04-11 DIAGNOSIS — E785 Hyperlipidemia, unspecified: Secondary | ICD-10-CM | POA: Diagnosis not present

## 2020-04-11 DIAGNOSIS — M109 Gout, unspecified: Secondary | ICD-10-CM | POA: Diagnosis not present

## 2020-04-12 ENCOUNTER — Encounter: Payer: Self-pay | Admitting: Family Medicine

## 2020-04-12 NOTE — Assessment & Plan Note (Signed)
Pt is wheelchair bound, needs new chair in order to maintain quality of life and help assist with ADL'S Due to nature of his neurological state, this can not be achieved with standard wheelchair. Pt unable to ambulate therefore walker or cane is not sufficient  Pt currently has outdated chair and house is equipped for use of chair.  Pt and caregiver, willing and able to operate motorized wheel chair

## 2020-04-12 NOTE — Assessment & Plan Note (Signed)
Chronic edema, chair with elevation of feet will help with edema

## 2020-04-14 ENCOUNTER — Other Ambulatory Visit: Payer: Self-pay | Admitting: *Deleted

## 2020-04-14 ENCOUNTER — Telehealth: Payer: Self-pay | Admitting: *Deleted

## 2020-04-14 DIAGNOSIS — E785 Hyperlipidemia, unspecified: Secondary | ICD-10-CM | POA: Diagnosis not present

## 2020-04-14 DIAGNOSIS — I10 Essential (primary) hypertension: Secondary | ICD-10-CM | POA: Diagnosis not present

## 2020-04-14 DIAGNOSIS — N1831 Chronic kidney disease, stage 3a: Secondary | ICD-10-CM | POA: Diagnosis not present

## 2020-04-14 DIAGNOSIS — K2270 Barrett's esophagus without dysplasia: Secondary | ICD-10-CM | POA: Diagnosis not present

## 2020-04-14 DIAGNOSIS — I69351 Hemiplegia and hemiparesis following cerebral infarction affecting right dominant side: Secondary | ICD-10-CM | POA: Diagnosis not present

## 2020-04-14 DIAGNOSIS — D631 Anemia in chronic kidney disease: Secondary | ICD-10-CM | POA: Diagnosis not present

## 2020-04-14 DIAGNOSIS — M109 Gout, unspecified: Secondary | ICD-10-CM | POA: Diagnosis not present

## 2020-04-14 DIAGNOSIS — I69322 Dysarthria following cerebral infarction: Secondary | ICD-10-CM | POA: Diagnosis not present

## 2020-04-14 DIAGNOSIS — K7581 Nonalcoholic steatohepatitis (NASH): Secondary | ICD-10-CM | POA: Diagnosis not present

## 2020-04-14 DIAGNOSIS — K5790 Diverticulosis of intestine, part unspecified, without perforation or abscess without bleeding: Secondary | ICD-10-CM | POA: Diagnosis not present

## 2020-04-14 DIAGNOSIS — E1140 Type 2 diabetes mellitus with diabetic neuropathy, unspecified: Secondary | ICD-10-CM | POA: Diagnosis not present

## 2020-04-14 DIAGNOSIS — E1122 Type 2 diabetes mellitus with diabetic chronic kidney disease: Secondary | ICD-10-CM | POA: Diagnosis not present

## 2020-04-14 DIAGNOSIS — K625 Hemorrhage of anus and rectum: Secondary | ICD-10-CM | POA: Diagnosis not present

## 2020-04-14 MED ORDER — OXYCODONE-ACETAMINOPHEN 7.5-325 MG PO TABS: 1.0000 | ORAL_TABLET | ORAL | 0 refills | Status: DC | PRN

## 2020-04-14 NOTE — Telephone Encounter (Signed)
Received call from [place], [facility] SN with [facility].   Reports that [facility] SN has evaluated patient and requested [place] SN to see patient 1x1, then 2x2 for wound care.   Also requested wound care orders as follows: 1. R arm blisters- clean with warm soapy water and pat dry- cover with calcium alginate, dry guaze and wrap with Coban for protection. Change dressing QOD. If blisters burst, new orders will need to be given.  2. R Buttock pressure ulcer- clean with warm soapy water. Apply hydrocolloid dressing like DuoDerm. Change PRN if soiled and Q 3 days.   No call back number given.   Call placed to [facility], [place] office [phone]. VO given to [place], [facility] SN case manager.

## 2020-04-14 NOTE — Telephone Encounter (Signed)
-----   Message from [name], MD sent at 04/12/2020 11:04 AM EDT ----- Regarding: I need the actual PT/OT note in order to order the wheelchair

## 2020-04-14 NOTE — Telephone Encounter (Signed)
Call placed to [facility], [facility] PT with [place] [phone]~ telephone.   LM on VM that PCP requires evaluation to complete order.

## 2020-04-14 NOTE — Telephone Encounter (Signed)
Received call from patient spouse, [name].   Requested refill on Oxycodone/APAP.   Ok to refill??  Last office visit 04/10/2020.  Last refill 01/10/2020.

## 2020-04-14 NOTE — Telephone Encounter (Signed)
Agree with below. Addendum:    I believe that she has limits with moving and including, toileting, bathing, feeding, dressing and grooming. I believe the power wheelchair is needed for pt to be able to perform ADL's in her home

## 2020-04-15 DIAGNOSIS — E1122 Type 2 diabetes mellitus with diabetic chronic kidney disease: Secondary | ICD-10-CM | POA: Diagnosis not present

## 2020-04-15 DIAGNOSIS — N1831 Chronic kidney disease, stage 3a: Secondary | ICD-10-CM | POA: Diagnosis not present

## 2020-04-15 DIAGNOSIS — D631 Anemia in chronic kidney disease: Secondary | ICD-10-CM | POA: Diagnosis not present

## 2020-04-15 DIAGNOSIS — K5790 Diverticulosis of intestine, part unspecified, without perforation or abscess without bleeding: Secondary | ICD-10-CM | POA: Diagnosis not present

## 2020-04-15 DIAGNOSIS — I69351 Hemiplegia and hemiparesis following cerebral infarction affecting right dominant side: Secondary | ICD-10-CM | POA: Diagnosis not present

## 2020-04-15 DIAGNOSIS — I69322 Dysarthria following cerebral infarction: Secondary | ICD-10-CM | POA: Diagnosis not present

## 2020-04-15 DIAGNOSIS — K2270 Barrett's esophagus without dysplasia: Secondary | ICD-10-CM | POA: Diagnosis not present

## 2020-04-15 DIAGNOSIS — K7581 Nonalcoholic steatohepatitis (NASH): Secondary | ICD-10-CM | POA: Diagnosis not present

## 2020-04-15 DIAGNOSIS — E1140 Type 2 diabetes mellitus with diabetic neuropathy, unspecified: Secondary | ICD-10-CM | POA: Diagnosis not present

## 2020-04-15 DIAGNOSIS — I10 Essential (primary) hypertension: Secondary | ICD-10-CM | POA: Diagnosis not present

## 2020-04-15 DIAGNOSIS — M109 Gout, unspecified: Secondary | ICD-10-CM | POA: Diagnosis not present

## 2020-04-15 DIAGNOSIS — K625 Hemorrhage of anus and rectum: Secondary | ICD-10-CM | POA: Diagnosis not present

## 2020-04-15 DIAGNOSIS — E785 Hyperlipidemia, unspecified: Secondary | ICD-10-CM | POA: Diagnosis not present

## 2020-04-16 ENCOUNTER — Telehealth: Payer: Self-pay | Admitting: *Deleted

## 2020-04-16 DIAGNOSIS — I69322 Dysarthria following cerebral infarction: Secondary | ICD-10-CM | POA: Diagnosis not present

## 2020-04-16 DIAGNOSIS — K5790 Diverticulosis of intestine, part unspecified, without perforation or abscess without bleeding: Secondary | ICD-10-CM | POA: Diagnosis not present

## 2020-04-16 DIAGNOSIS — E1122 Type 2 diabetes mellitus with diabetic chronic kidney disease: Secondary | ICD-10-CM | POA: Diagnosis not present

## 2020-04-16 DIAGNOSIS — N1831 Chronic kidney disease, stage 3a: Secondary | ICD-10-CM | POA: Diagnosis not present

## 2020-04-16 DIAGNOSIS — D631 Anemia in chronic kidney disease: Secondary | ICD-10-CM | POA: Diagnosis not present

## 2020-04-16 DIAGNOSIS — M109 Gout, unspecified: Secondary | ICD-10-CM | POA: Diagnosis not present

## 2020-04-16 DIAGNOSIS — E1140 Type 2 diabetes mellitus with diabetic neuropathy, unspecified: Secondary | ICD-10-CM | POA: Diagnosis not present

## 2020-04-16 DIAGNOSIS — I69351 Hemiplegia and hemiparesis following cerebral infarction affecting right dominant side: Secondary | ICD-10-CM | POA: Diagnosis not present

## 2020-04-16 DIAGNOSIS — K2270 Barrett's esophagus without dysplasia: Secondary | ICD-10-CM | POA: Diagnosis not present

## 2020-04-16 DIAGNOSIS — K7581 Nonalcoholic steatohepatitis (NASH): Secondary | ICD-10-CM | POA: Diagnosis not present

## 2020-04-16 DIAGNOSIS — I10 Essential (primary) hypertension: Secondary | ICD-10-CM | POA: Diagnosis not present

## 2020-04-16 DIAGNOSIS — E785 Hyperlipidemia, unspecified: Secondary | ICD-10-CM | POA: Diagnosis not present

## 2020-04-16 DIAGNOSIS — K625 Hemorrhage of anus and rectum: Secondary | ICD-10-CM | POA: Diagnosis not present

## 2020-04-16 NOTE — Telephone Encounter (Signed)
Received call from [place], [place]. Reports that she has only one subcontracted ATP to perform evaluation and fit of power operated wheelchair. States that he is unable to see patient soon as his schedule is booked out for weeks.   States that she feels this is a disservice to patient and recommended sending referral for new wheelchair to [employer].   Order placed on desk for signature.   Of note, contacted [facility] PT with [facility]. States that she currently has 1 PT visit remaining. States that she will contact [employer] to determine if PT evaluation is required with [employer]'s ATP evaluation. If needed, she will coordinate with them and try to schedule during last PT visit. VO given for PRN PT visit if unable to coordinate quickly.

## 2020-04-16 NOTE — Telephone Encounter (Signed)
Spoke with RMR who advises TCS/EGD with propofol for Dx: anemia, recent gi bleeding, ruq pain, barrett's, h/o colonic adenomas.   He advises inpatient bowel prep due to limited mobility and gastroparesis, h/o poor bowel prep in the past.   FIRST WE WILL NEED TO HAVE ANESTHESIA REVIEW CASE AND SEE IF THEY WOULD EVEN AGREE TO A TCS/EGD AT [facility] BEFORE WE ADMIT HIM FOR BOWEL PREP.

## 2020-04-16 NOTE — Telephone Encounter (Signed)
Spoke with melanie in endo. She will have anesthesia review and let us know

## 2020-04-17 NOTE — Telephone Encounter (Signed)
Noted, change in company to provide wheelchair

## 2020-04-20 ENCOUNTER — Telehealth: Payer: Self-pay | Admitting: Family Medicine

## 2020-04-20 DIAGNOSIS — I69351 Hemiplegia and hemiparesis following cerebral infarction affecting right dominant side: Secondary | ICD-10-CM | POA: Diagnosis not present

## 2020-04-20 DIAGNOSIS — K7581 Nonalcoholic steatohepatitis (NASH): Secondary | ICD-10-CM | POA: Diagnosis not present

## 2020-04-20 DIAGNOSIS — K2270 Barrett's esophagus without dysplasia: Secondary | ICD-10-CM | POA: Diagnosis not present

## 2020-04-20 DIAGNOSIS — E785 Hyperlipidemia, unspecified: Secondary | ICD-10-CM | POA: Diagnosis not present

## 2020-04-20 DIAGNOSIS — D631 Anemia in chronic kidney disease: Secondary | ICD-10-CM | POA: Diagnosis not present

## 2020-04-20 DIAGNOSIS — E1140 Type 2 diabetes mellitus with diabetic neuropathy, unspecified: Secondary | ICD-10-CM | POA: Diagnosis not present

## 2020-04-20 DIAGNOSIS — K625 Hemorrhage of anus and rectum: Secondary | ICD-10-CM | POA: Diagnosis not present

## 2020-04-20 DIAGNOSIS — I69322 Dysarthria following cerebral infarction: Secondary | ICD-10-CM | POA: Diagnosis not present

## 2020-04-20 DIAGNOSIS — M109 Gout, unspecified: Secondary | ICD-10-CM | POA: Diagnosis not present

## 2020-04-20 DIAGNOSIS — E1122 Type 2 diabetes mellitus with diabetic chronic kidney disease: Secondary | ICD-10-CM | POA: Diagnosis not present

## 2020-04-20 DIAGNOSIS — I10 Essential (primary) hypertension: Secondary | ICD-10-CM | POA: Diagnosis not present

## 2020-04-20 DIAGNOSIS — N1831 Chronic kidney disease, stage 3a: Secondary | ICD-10-CM | POA: Diagnosis not present

## 2020-04-20 DIAGNOSIS — K5790 Diverticulosis of intestine, part unspecified, without perforation or abscess without bleeding: Secondary | ICD-10-CM | POA: Diagnosis not present

## 2020-04-20 NOTE — Chronic Care Management (AMB) (Signed)
  Chronic Care Management   Note  04/20/2020 Name: [name] MRN: [id number] DOB: 04-Nov-1946  [name] is a 74 y.o. year old male who is a primary care patient of [place], [name], MD. I reached out to [name] by phone today in response to a referral sent by [name]'s PCP, Buelah Manis, [name], MD.   [name] was given information about Chronic Care Management services today including:  1. CCM service includes personalized support from designated clinical staff supervised by his physician, including individualized plan of care and coordination with other care providers 2. 24/7 contact phone numbers for assistance for urgent and routine care needs. 3. Service will only be billed when office clinical staff spend 20 minutes or more in a month to coordinate care. 4. Only one practitioner may furnish and bill the service in a calendar month. 5. The patient may stop CCM services at any time (effective at the end of the month) by phone call to the office staff.   Patient agreed to services and verbal consent obtained.   Follow up plan:   [place]

## 2020-04-21 NOTE — Telephone Encounter (Signed)
Called [facility] GI, spoke to [facility] for referral for consult. She will send message to Lorriane Shire to let her know our office will be sending records for review. Lorriane Shire will contact pt to schedule visit.  Records faxed to [facility] GI.

## 2020-04-21 NOTE — Telephone Encounter (Signed)
Called spoke with pts wife mrs Tindel verified name and dob Notified pt's wife mrs Clendenen of providers recommendations Agreeable to referral for [facility] for possible consult  for egd/tcs  MRS. Faiola requested an appointment after 11am due to the commute

## 2020-04-21 NOTE — Addendum Note (Signed)
Addended by: Hassan Rowan on: 04/21/2020 03:11 PM   Modules accepted: Orders

## 2020-04-21 NOTE — Telephone Encounter (Signed)
Spoke with Melanie in endo and she states that Dr. Briant Cedar recommends he be done at a high level care facility and not here.

## 2020-04-21 NOTE — Telephone Encounter (Signed)
Please let pt know that our anesthesiologist recommends patient have EGD/TCS to be done at a higher level facility than [facility] due to anesthesia risks and multiple comorbidities.   I would recommend him going to [facility] for consultation for possible EGD/TCS for anemia, gi bleeding, chronic ruq pain, colonic adenomas, barretts esophagus. "may need inpatient bowel prep and anesthesiology declined to assist with case at [facility], advising higher level of care facility".

## 2020-04-22 IMAGING — DX DG TIBIA/FIBULA 2V*L*
3 series · 3 of 3 positions shown · non-contrast
Comparison: Left tibia and fibula, 07/05/2011, left foot and ankle,
08/29/2005

CLINICAL DATA: Fall, pain

EXAM:
LEFT TIBIA AND FIBULA - 2 VIEW; LEFT FOOT - 2 VIEW; LEFT ANKLE
COMPLETE - 3+ VIEW

[tibia ap]
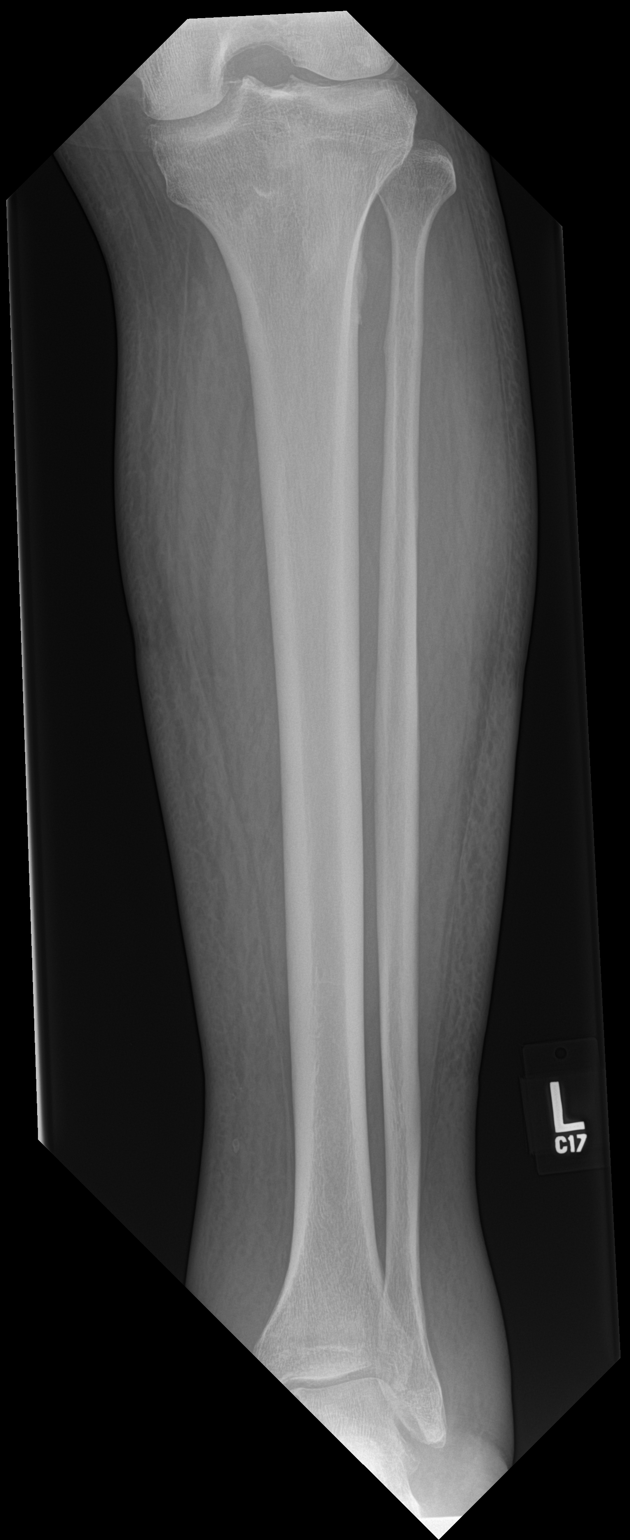

[tibia lat (1 of 2)]
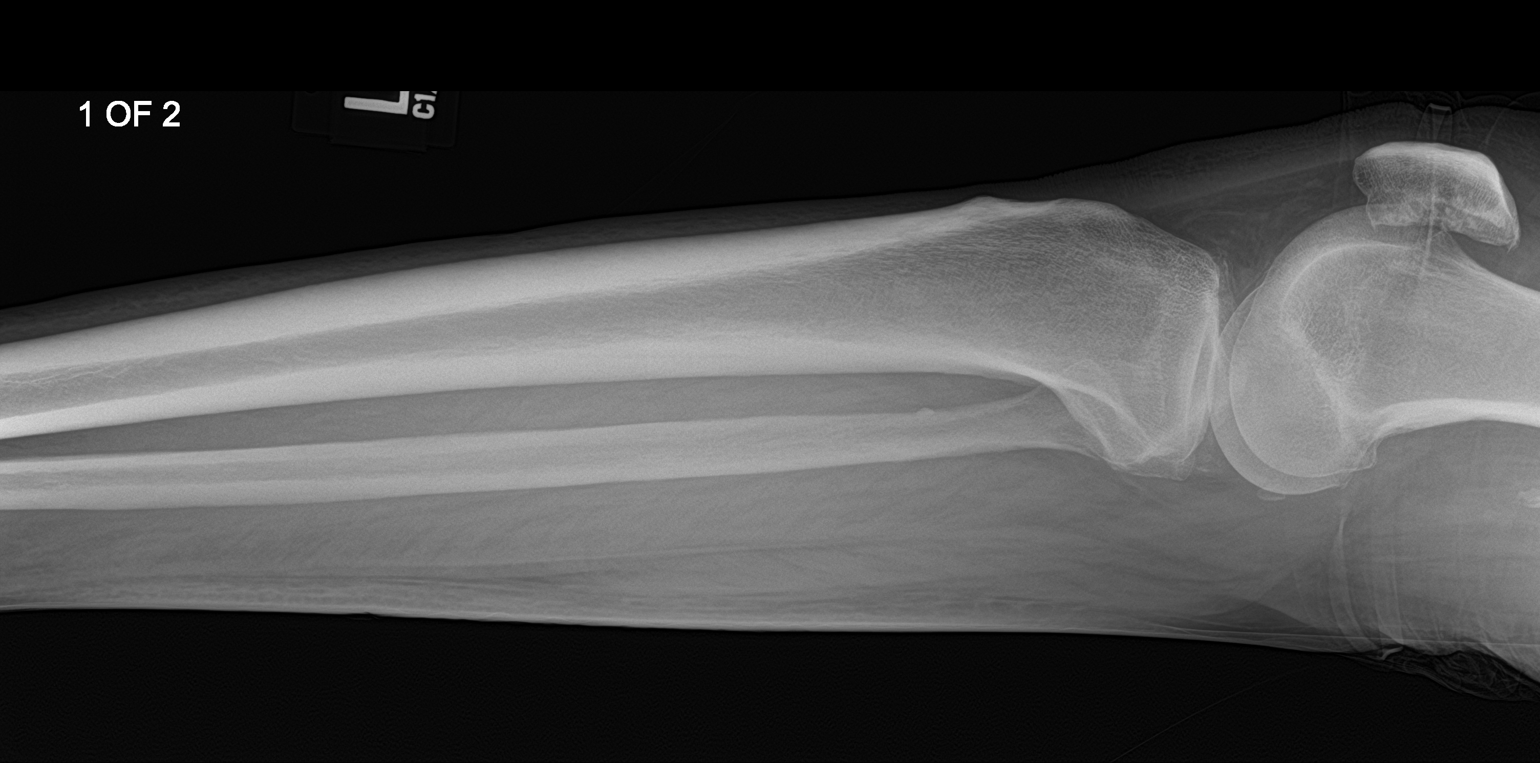

[tibia lat (2 of 2)]
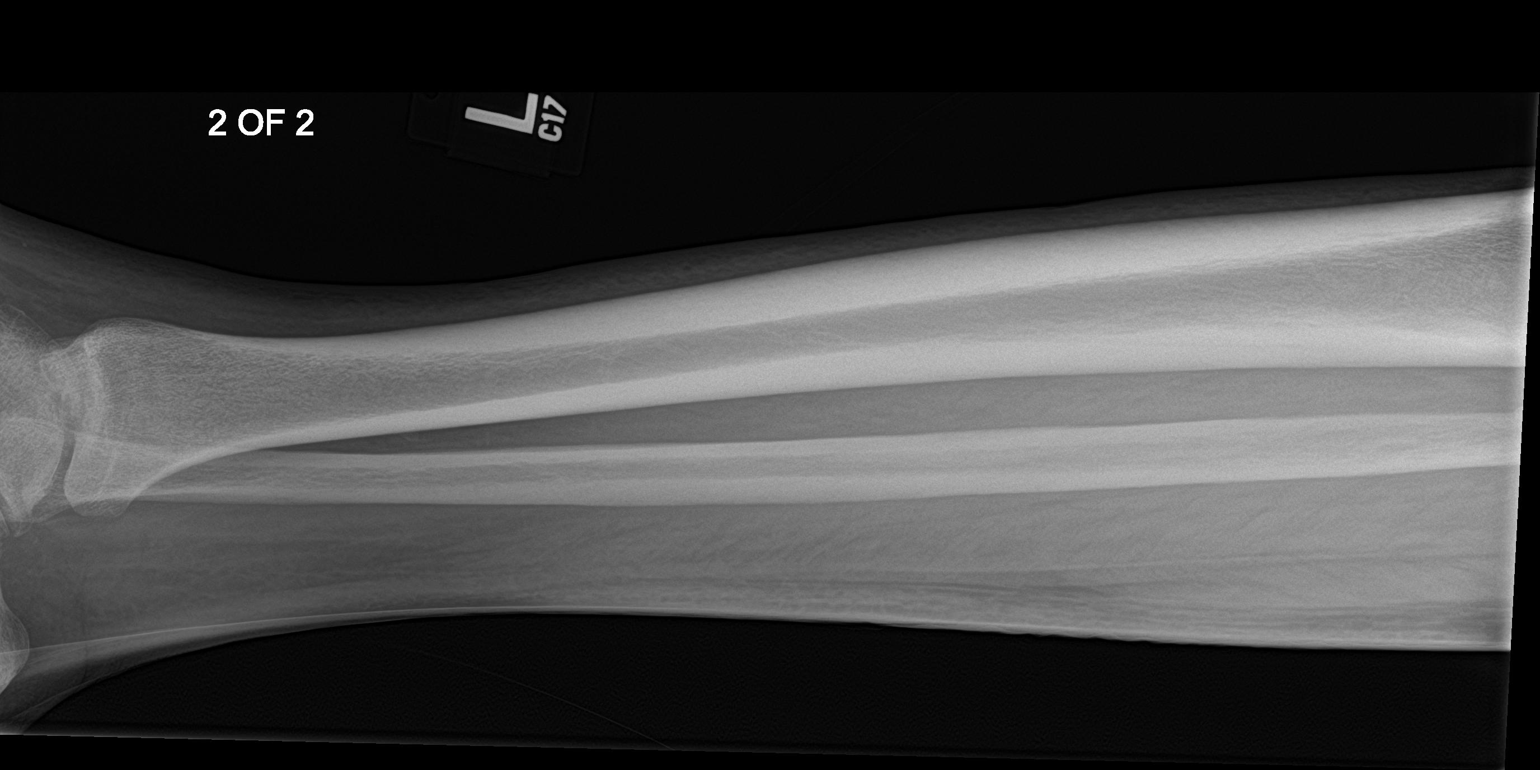

[3 of 3 positions shown; findings below may reference images not displayed]

FINDINGS: No fracture or dislocation of the left tibia or fibula.

No fracture or dislocation of the left ankle. There is mild ankle
mortise arthrosis with a probable nonacute osteochondral lesion of
the medial talar dome. There is extensive soft tissue edema about
the ankle.

No fracture or dislocation of the left foot. There is extensive
erosive arthropathy about the first metatarsophalangeal joint and
midfoot, most consistent with gouty arthropathy. There is diffuse
soft tissue edema about the foot.
IMPRESSION: 1.  No fracture or dislocation of the left tibia or fibula.

2. No fracture or dislocation of the left ankle. There is mild ankle
mortise arthrosis with a probable nonacute osteochondral lesion of
the medial talar dome, similar in appearance compared to prior
examination dated 08/30/2015 with slightly worsened fibrosis. There
is extensive soft tissue edema about the ankle.

3. No fracture or dislocation of the left foot. There is extensive
erosive arthropathy about the first metatarsophalangeal joint and
midfoot, most consistent with gouty arthropathy and generally in
keeping with appearance on examination dated 08/29/2005. There is
diffuse soft tissue edema about the foot.

## 2020-04-22 IMAGING — DX DG FOOT 2V*L*
2 series · 2 of 2 positions shown · non-contrast
Comparison: Left tibia and fibula, 07/05/2011, left foot and ankle,
08/29/2005

CLINICAL DATA: Fall, pain

EXAM:
LEFT TIBIA AND FIBULA - 2 VIEW; LEFT FOOT - 2 VIEW; LEFT ANKLE
COMPLETE - 3+ VIEW

[foot ap]
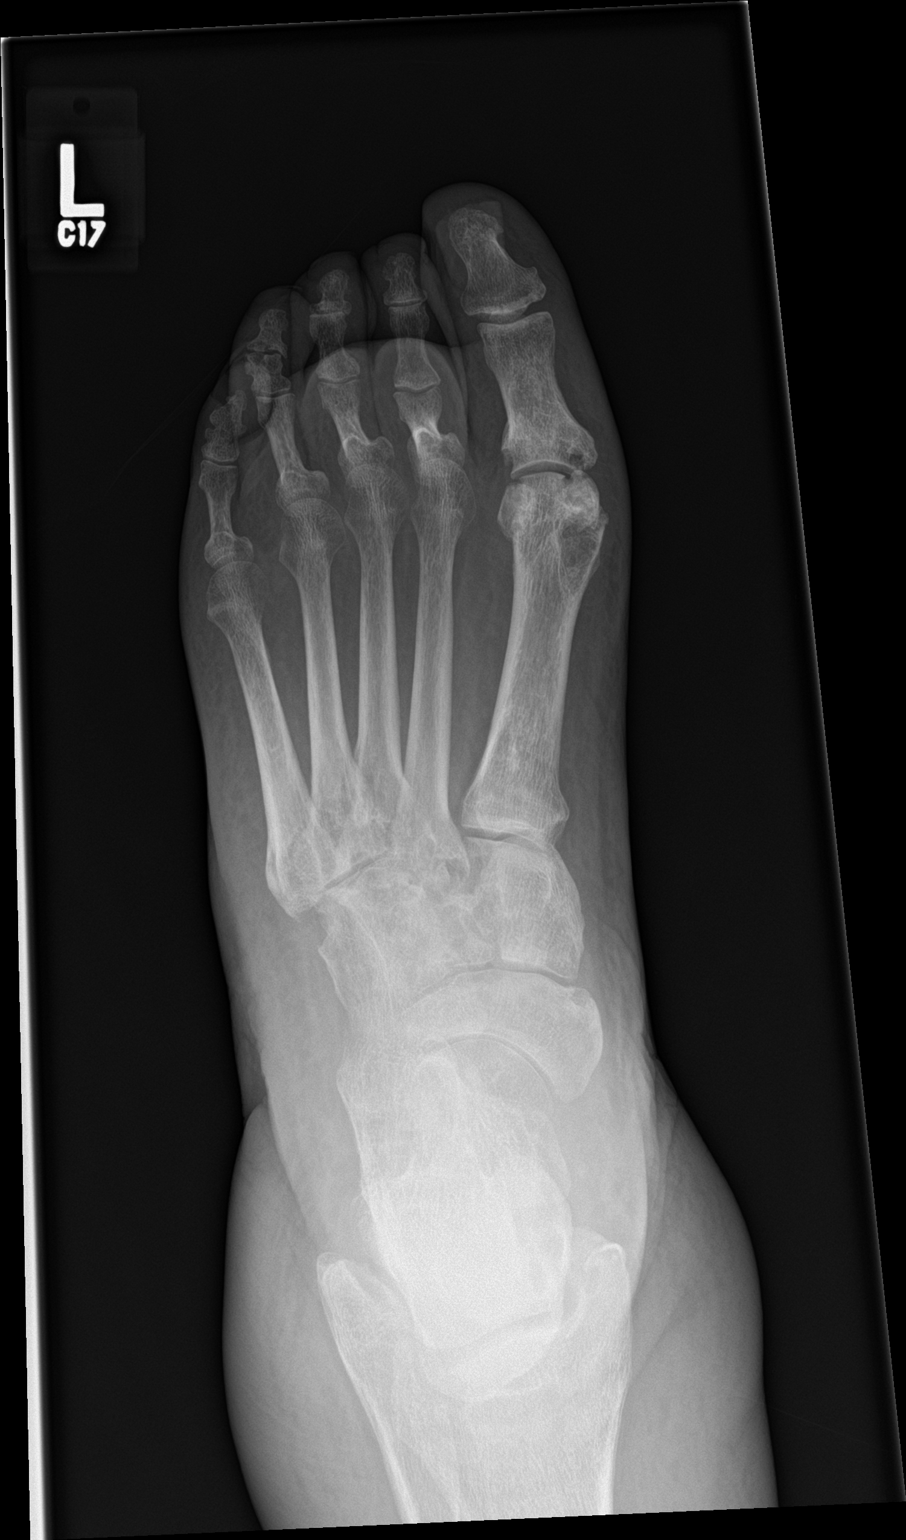

[foot lat]
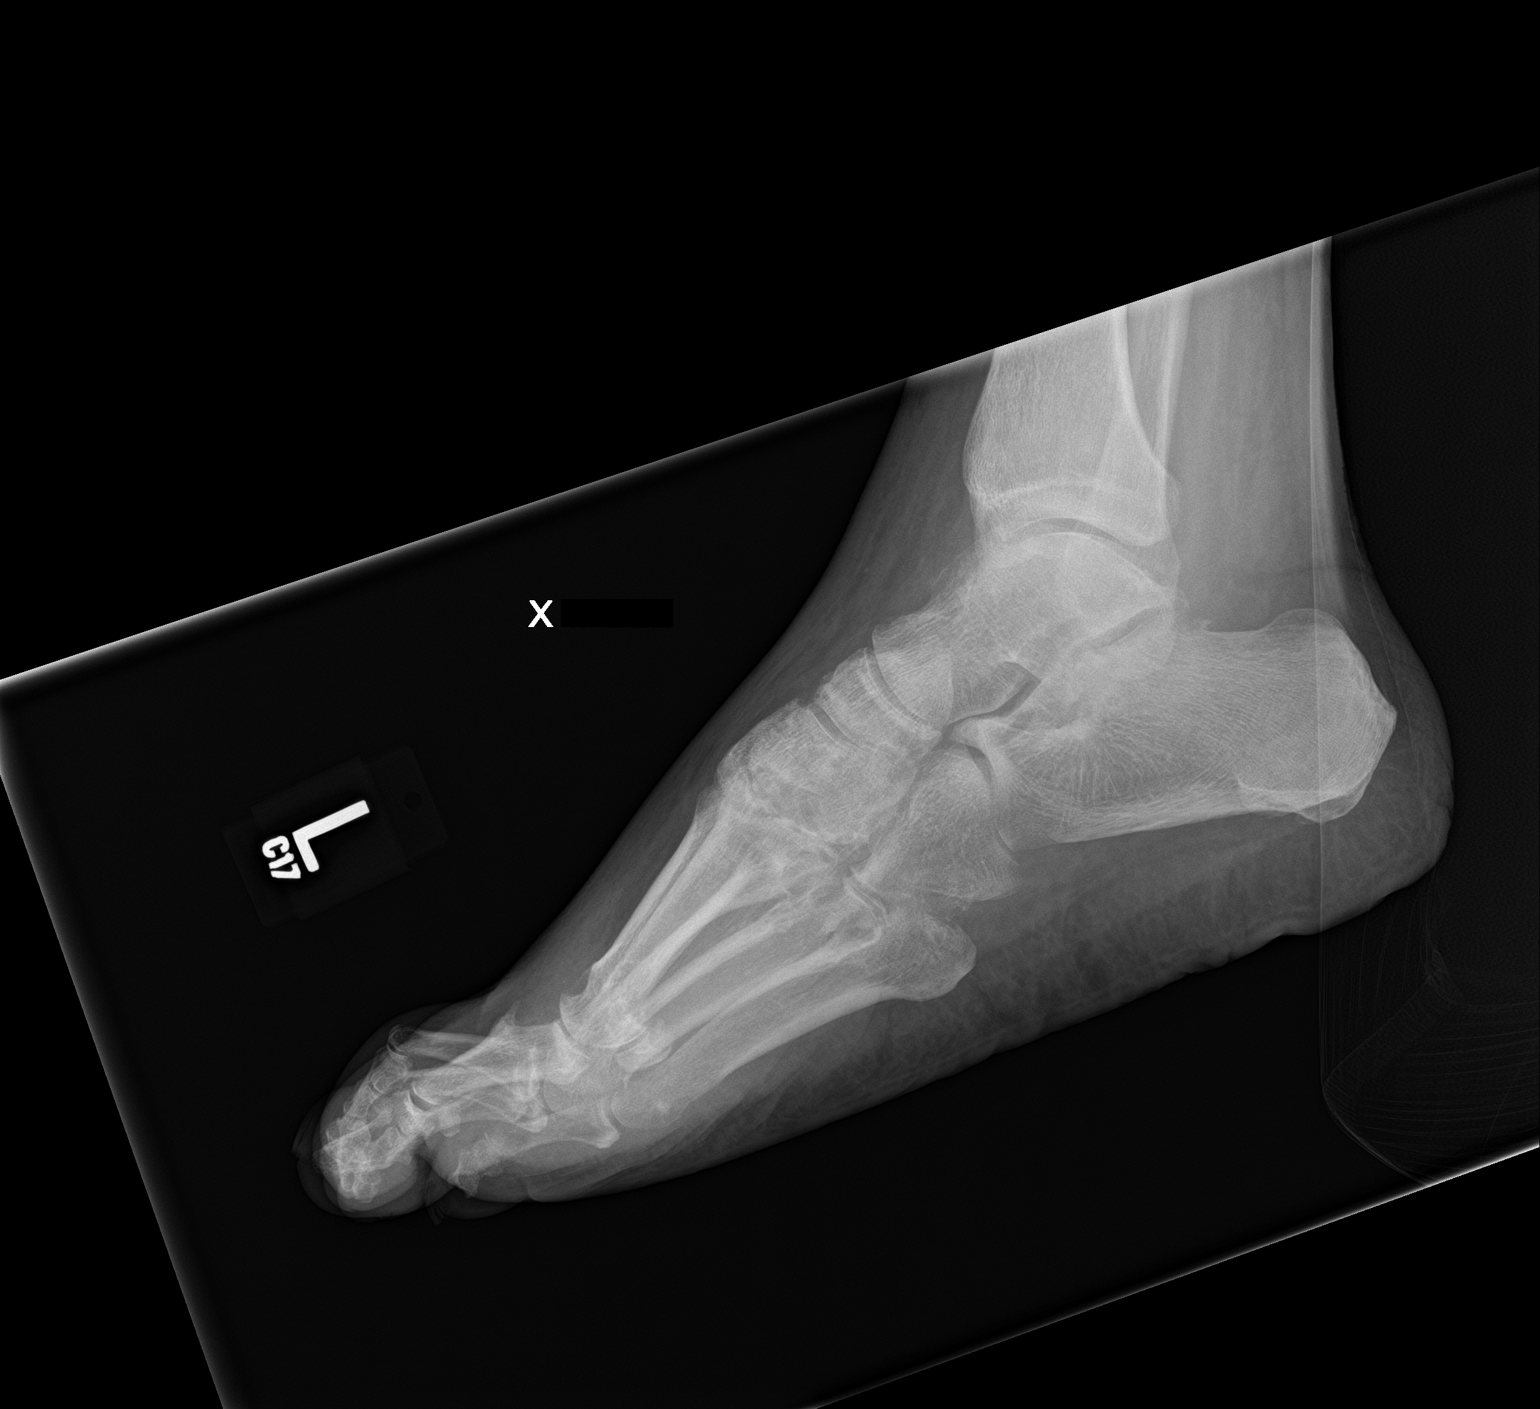

[2 of 2 positions shown; findings below may reference images not displayed]

FINDINGS: No fracture or dislocation of the left tibia or fibula.

No fracture or dislocation of the left ankle. There is mild ankle
mortise arthrosis with a probable nonacute osteochondral lesion of
the medial talar dome. There is extensive soft tissue edema about
the ankle.

No fracture or dislocation of the left foot. There is extensive
erosive arthropathy about the first metatarsophalangeal joint and
midfoot, most consistent with gouty arthropathy. There is diffuse
soft tissue edema about the foot.
IMPRESSION: 1.  No fracture or dislocation of the left tibia or fibula.

2. No fracture or dislocation of the left ankle. There is mild ankle
mortise arthrosis with a probable nonacute osteochondral lesion of
the medial talar dome, similar in appearance compared to prior
examination dated 08/30/2015 with slightly worsened fibrosis. There
is extensive soft tissue edema about the ankle.

3. No fracture or dislocation of the left foot. There is extensive
erosive arthropathy about the first metatarsophalangeal joint and
midfoot, most consistent with gouty arthropathy and generally in
keeping with appearance on examination dated 08/29/2005. There is
diffuse soft tissue edema about the foot.

## 2020-04-23 ENCOUNTER — Telehealth: Payer: Self-pay | Admitting: *Deleted

## 2020-04-23 DIAGNOSIS — E1140 Type 2 diabetes mellitus with diabetic neuropathy, unspecified: Secondary | ICD-10-CM | POA: Diagnosis not present

## 2020-04-23 DIAGNOSIS — E785 Hyperlipidemia, unspecified: Secondary | ICD-10-CM | POA: Diagnosis not present

## 2020-04-23 DIAGNOSIS — K2270 Barrett's esophagus without dysplasia: Secondary | ICD-10-CM | POA: Diagnosis not present

## 2020-04-23 DIAGNOSIS — I69322 Dysarthria following cerebral infarction: Secondary | ICD-10-CM | POA: Diagnosis not present

## 2020-04-23 DIAGNOSIS — K5790 Diverticulosis of intestine, part unspecified, without perforation or abscess without bleeding: Secondary | ICD-10-CM | POA: Diagnosis not present

## 2020-04-23 DIAGNOSIS — N1831 Chronic kidney disease, stage 3a: Secondary | ICD-10-CM | POA: Diagnosis not present

## 2020-04-23 DIAGNOSIS — K625 Hemorrhage of anus and rectum: Secondary | ICD-10-CM | POA: Diagnosis not present

## 2020-04-23 DIAGNOSIS — D631 Anemia in chronic kidney disease: Secondary | ICD-10-CM | POA: Diagnosis not present

## 2020-04-23 DIAGNOSIS — I69351 Hemiplegia and hemiparesis following cerebral infarction affecting right dominant side: Secondary | ICD-10-CM | POA: Diagnosis not present

## 2020-04-23 DIAGNOSIS — K7581 Nonalcoholic steatohepatitis (NASH): Secondary | ICD-10-CM | POA: Diagnosis not present

## 2020-04-23 DIAGNOSIS — M109 Gout, unspecified: Secondary | ICD-10-CM | POA: Diagnosis not present

## 2020-04-23 DIAGNOSIS — E1122 Type 2 diabetes mellitus with diabetic chronic kidney disease: Secondary | ICD-10-CM | POA: Diagnosis not present

## 2020-04-23 DIAGNOSIS — I10 Essential (primary) hypertension: Secondary | ICD-10-CM | POA: Diagnosis not present

## 2020-04-23 NOTE — Telephone Encounter (Signed)
Received call from Janace Hoard [place] Representative [phone], Ext: [phone].  Inquired as to status of order for wheelchair. Advised that new orders have been sent to [employer]. States that she contacted [employer] and was advised that no order has been received.    Contacted [employer] to inquire and to verify fax number. Was advised that orders and notes have been received and that patient is scheduled for fit evaluation on 05/14/2020.  Contacted Angie with [employer] to make aware. LM on VM with details.

## 2020-04-24 ENCOUNTER — Telehealth: Payer: Self-pay | Admitting: *Deleted

## 2020-04-24 DIAGNOSIS — N1831 Chronic kidney disease, stage 3a: Secondary | ICD-10-CM | POA: Diagnosis not present

## 2020-04-24 DIAGNOSIS — D631 Anemia in chronic kidney disease: Secondary | ICD-10-CM | POA: Diagnosis not present

## 2020-04-24 DIAGNOSIS — E1122 Type 2 diabetes mellitus with diabetic chronic kidney disease: Secondary | ICD-10-CM | POA: Diagnosis not present

## 2020-04-24 DIAGNOSIS — E1140 Type 2 diabetes mellitus with diabetic neuropathy, unspecified: Secondary | ICD-10-CM | POA: Diagnosis not present

## 2020-04-24 DIAGNOSIS — E785 Hyperlipidemia, unspecified: Secondary | ICD-10-CM | POA: Diagnosis not present

## 2020-04-24 DIAGNOSIS — I10 Essential (primary) hypertension: Secondary | ICD-10-CM | POA: Diagnosis not present

## 2020-04-24 DIAGNOSIS — K7581 Nonalcoholic steatohepatitis (NASH): Secondary | ICD-10-CM | POA: Diagnosis not present

## 2020-04-24 DIAGNOSIS — K2270 Barrett's esophagus without dysplasia: Secondary | ICD-10-CM | POA: Diagnosis not present

## 2020-04-24 DIAGNOSIS — I69351 Hemiplegia and hemiparesis following cerebral infarction affecting right dominant side: Secondary | ICD-10-CM | POA: Diagnosis not present

## 2020-04-24 DIAGNOSIS — K5790 Diverticulosis of intestine, part unspecified, without perforation or abscess without bleeding: Secondary | ICD-10-CM | POA: Diagnosis not present

## 2020-04-24 DIAGNOSIS — M109 Gout, unspecified: Secondary | ICD-10-CM | POA: Diagnosis not present

## 2020-04-24 DIAGNOSIS — K625 Hemorrhage of anus and rectum: Secondary | ICD-10-CM | POA: Diagnosis not present

## 2020-04-24 DIAGNOSIS — I69322 Dysarthria following cerebral infarction: Secondary | ICD-10-CM | POA: Diagnosis not present

## 2020-04-24 NOTE — Telephone Encounter (Signed)
Noted, okay to resume at end of month for chair evaluation

## 2020-04-24 NOTE — Telephone Encounter (Signed)
Received call from Tressia Danas, [facility] PT with [place]. [phone]~ telephone.   Reports that patient is to be discharged from PT services today as there is no cause to continue.   Reports that patient has evaluation with [employer] for W/C on 05/14/2020 at 11am.   Reports that new orders for [facility] PT will need to be placed that week for PT to be able to assist with wheelchair fit.   MD to be made aware.

## 2020-05-08 ENCOUNTER — Other Ambulatory Visit: Payer: Self-pay | Admitting: *Deleted

## 2020-05-08 DIAGNOSIS — Z0000 Encounter for general adult medical examination without abnormal findings: Secondary | ICD-10-CM

## 2020-05-08 DIAGNOSIS — M6281 Muscle weakness (generalized): Secondary | ICD-10-CM

## 2020-05-08 DIAGNOSIS — I69959 Hemiplegia and hemiparesis following unspecified cerebrovascular disease affecting unspecified side: Secondary | ICD-10-CM

## 2020-05-08 DIAGNOSIS — Z8673 Personal history of transient ischemic attack (TIA), and cerebral infarction without residual deficits: Secondary | ICD-10-CM

## 2020-05-12 DIAGNOSIS — Z794 Long term (current) use of insulin: Secondary | ICD-10-CM | POA: Diagnosis not present

## 2020-05-12 DIAGNOSIS — E1122 Type 2 diabetes mellitus with diabetic chronic kidney disease: Secondary | ICD-10-CM | POA: Diagnosis not present

## 2020-05-12 DIAGNOSIS — N182 Chronic kidney disease, stage 2 (mild): Secondary | ICD-10-CM | POA: Diagnosis not present

## 2020-05-12 DIAGNOSIS — E039 Hypothyroidism, unspecified: Secondary | ICD-10-CM | POA: Diagnosis not present

## 2020-05-12 DIAGNOSIS — E559 Vitamin D deficiency, unspecified: Secondary | ICD-10-CM | POA: Diagnosis not present

## 2020-05-13 LAB — LIPID PANEL
Cholesterol: 138 mg/dL (ref <200)
HDL: 50 mg/dL (ref >=40)
LDL Cholesterol (Calc): 64 mg/dL (calc)
Non-HDL Cholesterol (Calc): 88 mg/dL (calc) (ref <130)
Total CHOL/HDL Ratio: 2.8 (calc) (ref <5.0)
Triglycerides: 153 mg/dL — ABNORMAL HIGH (ref <150)

## 2020-05-13 LAB — COMPLETE METABOLIC PANEL WITH GFR
AG Ratio: 1.3 (calc) (ref 1.0–2.5)
ALT: 8 U/L — ABNORMAL LOW (ref 9–46)
AST: 11 U/L (ref 10–35)
Albumin: 4.3 g/dL (ref 3.6–5.1)
Alkaline phosphatase (APISO): 99 U/L (ref 35–144)
BUN/Creatinine Ratio: 15 (calc) (ref 6–22)
BUN: 28 mg/dL — ABNORMAL HIGH (ref 7–25)
CO2: 27 mmol/L (ref 20–32)
Calcium: 11.2 mg/dL — ABNORMAL HIGH (ref 8.6–10.3)
Chloride: 104 mmol/L (ref 98–110)
Creat: 1.86 mg/dL — ABNORMAL HIGH (ref 0.70–1.18)
GFR, Est African American: 41 mL/min/{1.73_m2} — ABNORMAL LOW (ref >=60)
GFR, Est Non African American: 35 mL/min/{1.73_m2} — ABNORMAL LOW (ref >=60)
Globulin: 3.4 g/dL (calc) (ref 1.9–3.7)
Glucose, Bld: 112 mg/dL — ABNORMAL HIGH (ref 65–99)
Potassium: 4.6 mmol/L (ref 3.5–5.3)
Sodium: 140 mmol/L (ref 135–146)
Total Bilirubin: 0.2 mg/dL (ref 0.2–1.2)
Total Protein: 7.7 g/dL (ref 6.1–8.1)

## 2020-05-13 LAB — VITAMIN D 25 HYDROXY (VIT D DEFICIENCY, FRACTURES): Vit D, 25-Hydroxy: 59 ng/mL (ref 30–100)

## 2020-05-13 LAB — MICROALBUMIN / CREATININE URINE RATIO
Creatinine, Urine: 134 mg/dL (ref 20–320)
Microalb Creat Ratio: 918 mcg/mg creat — ABNORMAL HIGH (ref <30)
Microalb, Ur: 123 mg/dL

## 2020-05-13 LAB — T4, FREE: Free T4: 1.3 ng/dL (ref 0.8–1.8)

## 2020-05-13 LAB — TSH: TSH: 2.68 mIU/L (ref 0.40–4.50)

## 2020-05-18 ENCOUNTER — Ambulatory Visit (INDEPENDENT_AMBULATORY_CARE_PROVIDER_SITE_OTHER): Payer: Medicare Other | Admitting: "Endocrinology

## 2020-05-18 ENCOUNTER — Ambulatory Visit (HOSPITAL_COMMUNITY)
Admission: RE | Admit: 2020-05-18 | Discharge: 2020-05-18 | Disposition: A | Discharge status: Home / Self Care | Payer: Medicare Other | Source: Ambulatory Visit | Attending: Radiation Oncology | Admitting: Radiation Oncology

## 2020-05-18 ENCOUNTER — Encounter: Payer: Self-pay | Admitting: "Endocrinology

## 2020-05-18 ENCOUNTER — Other Ambulatory Visit: Payer: Self-pay

## 2020-05-18 VITALS — BP 120/74 | HR 72

## 2020-05-18 DIAGNOSIS — E782 Mixed hyperlipidemia: Secondary | ICD-10-CM

## 2020-05-18 DIAGNOSIS — E039 Hypothyroidism, unspecified: Secondary | ICD-10-CM

## 2020-05-18 DIAGNOSIS — Z794 Long term (current) use of insulin: Secondary | ICD-10-CM

## 2020-05-18 DIAGNOSIS — C3490 Malignant neoplasm of unspecified part of unspecified bronchus or lung: Secondary | ICD-10-CM | POA: Diagnosis not present

## 2020-05-18 DIAGNOSIS — E1122 Type 2 diabetes mellitus with diabetic chronic kidney disease: Secondary | ICD-10-CM

## 2020-05-18 DIAGNOSIS — N182 Chronic kidney disease, stage 2 (mild): Secondary | ICD-10-CM

## 2020-05-18 DIAGNOSIS — C3412 Malignant neoplasm of upper lobe, left bronchus or lung: Secondary | ICD-10-CM | POA: Diagnosis not present

## 2020-05-18 NOTE — Progress Notes (Signed)
05/18/2020  Endocrinology follow-up note   Subjective:    Patient ID: [name], male    DOB: [birth year],    Past Medical History:  Diagnosis Date   Arthritis    Asthma    "hx of"   Barrett's esophagus    EGD 03/23/2011 & EGD 2/09 bx proven   BPH (benign prostatic hyperplasia)    Cancer of parotid gland ([place]) 11/23/12   Adenocarcinoma   Chronic abdominal pain    Chronic constipation    Colon polyp 03/23/2011   tubular adenoma, Dr. Gala Romney   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 ([place]) 07/15/2011   CVA (cerebral infarction) 1998   right sided deficit   Delayed gastric emptying 2018   Diverticulosis    TCS 03/23/11 pancolonic diverticula &TCS 5/08, pancolonic diverticula   DM (diabetes mellitus) ([place])    Edema of lower extremity 12/21/12   bilateral    Esotropia of left eye    GERD (gastroesophageal reflux disease)    Glaucoma (increased eye pressure)    Gout    Gout    Hemorrhagic colitis 06/06/2012.   Hemorrhoids, internal 03/23/2011   tcs by Dr. Gala Romney   Hepatitis    esosiniphilic, tx with prednisone   Hiatal hernia    History of radiation therapy 05/21/18- 05/30/18   Left Lung/ 54 Gy delivered in 3 fractions of 18 Gy. SBRT   HTN (hypertension)    Hx of radiation therapy 1974   right base of skull area-lymphoma   Hyperlipidemia    Lower facial weakness    Right   Lymphoma ([place]) 1974   XRT at [facility], right base of skull area   Neuropathy    Non-small cell lung cancer ([place]) dx'd 04/26/18   Peripheral edema    R>L legs   Rash    chronic, recurrent, R>L legs   Renal insufficiency    Steatohepatitis    liver biopsy 2009   Stroke [facility]) 1998   right hemiparesis/plegia   Past Surgical History:  Procedure Laterality Date   BIOPSY  12/01/2016   Procedure: BIOPSY;  Surgeon: Daneil Dolin, MD;  Location: [facility] END[facility] SUITE;  Service: Endoscopy;;  duodenum, gastric, esophagus   CHOLECYSTECTOMY     COLONOSCOPY  03/23/11    Dr. Gala Romney  pancolonic diverticula, hemorrhoids, tubular adenoma.. next tcs 03/2016   COLONOSCOPY WITH PROPOFOL N/A 12/01/2016   inadequate bowel prep precluded exam   ESOPHAGOGASTRODUODENOSCOPY  02/05/08   goblet cell metaplasia/negative for H.pylori   ESOPHAGOGASTRODUODENOSCOPY  03/23/11   Dr. Gala Romney, barretts, hiatal hernia   ESOPHAGOGASTRODUODENOSCOPY (EGD) WITH PROPOFOL N/A 12/01/2016   Dr. Gala Romney: Large amount of retained gastric contents precluded completion of the stomach. Mucosal changes were found in the stomach. Erosions and somewhat scalloped appearing mucosa present, reactive gastritis/no H pylori. Barrett's esophagus noted, no dysplasia on biopsy. Duodenal biopsies taken as well, benign, no evidence of eosinophilia.   IR FLUORO GUIDED NEEDLE PLC ASPIRATION/INJECTION LOC  12/13/2018   IR KYPHO LUMBAR INC FX REDUCE BONE BX UNI/BIL CANNULATION INC/IMAGING  12/13/2018   IR RADIOLOGY PERIPHERAL GUIDED IV START  12/13/2018   IR US GUIDE VASC ACCESS LEFT  12/13/2018   MASS BIOPSY  11/01/2012   Procedure: NECK MASS BIOPSY;  Surgeon: Ascencion Dike, MD;  Location: [facility] ORS;  Service: ENT;  Laterality: Right;  Excisional Bx Right Neck Mass; attempted external jugular cutdown of left side   PAROTIDECTOMY  11/24/2012   Procedure: PAROTIDECTOMY;  Surgeon: Evelena Peat  Cassie Freer, MD;  Location: [place];  Service: ENT;  Laterality: N/A;  Total parotidectomy   PLEURECTOMY     right lymph node removal Right    behind right ear   Right video-assisted thoracic surgery, pleurectomy, and pleurodesis  2011   VIDEO BRONCHOSCOPY WITH ENDOBRONCHIAL NAVIGATION N/A 04/26/2018   Procedure: VIDEO BRONCHOSCOPY WITH ENDOBRONCHIAL NAVIGATION;  Surgeon: Melrose Nakayama, MD;  Location: [place];  Service: Thoracic;  Laterality: N/A;   Social History   Socioeconomic History   Marital status: Married    Spouse name: Not on file   Number of children: 1   Years of education: Not on file   Highest education level: Not on  file  Occupational History   Occupation: disabled  Tobacco Use   Smoking status: Former Smoker    Packs/day: 3.00    Years: 25.00    Pack years: 75.00    Types: Cigarettes    Quit date: 03/01/1997    Years since quitting: 23.2   Smokeless tobacco: Never Used  Substance and Sexual Activity   Alcohol use: No   Drug use: No   Sexual activity: Not Currently  Other Topics Concern   Not on file  Social History Narrative   Not on file   Social Determinants of Health   Financial Resource Strain:    Difficulty of Paying Living Expenses:   Food Insecurity:    Worried About Charity fundraiser in the Last Year:    Arboriculturist in the Last Year:   Transportation Needs:    Film/video editor (Medical):    Lack of Transportation (Non-Medical):   Physical Activity:    Days of Exercise per Week:    Minutes of Exercise per Session:   Stress:    Feeling of Stress :   Social Connections:    Frequency of Communication with Friends and Family:    Frequency of Social Gatherings with Friends and Family:    Attends Religious Services:    Active Member of Clubs or Organizations:    Attends Archivist Meetings:    Marital Status:    Outpatient Encounter Medications as of 05/18/2020  Medication Sig   allopurinol (ZYLOPRIM) 300 MG tablet Take 1 tablet (300 mg total) by mouth daily.   amLODipine (NORVASC) 10 MG tablet TAKE 1 TABLET(10 MG) BY MOUTH DAILY (Patient taking differently: Take 10 mg by mouth daily. TAKE 1 TABLET(10 MG) BY MOUTH DAILY)   cephALEXin (KEFLEX) 500 MG capsule Take 1 capsule (500 mg total) by mouth 2 (two) times daily.   cloNIDine (CATAPRES) 0.2 MG tablet Take 1 tablet (0.2 mg total) by mouth 3 (three) times daily.   diclofenac sodium (VOLTAREN) 1 % GEL Apply twice a day for affected area on ankle as needed (Patient taking differently: Apply 4 g topically 2 (two) times daily as needed. Apply twice a day for affected area on ankle as  needed)   furosemide (LASIX) 40 MG tablet Take 1 tablet (40 mg total) by mouth daily.   gabapentin (NEURONTIN) 300 MG capsule TAKE 2 CAPSULES BY MOUTH TWICE DAILY, MAY TAKE AN ADDTIONAL 2 CAPSULES MIDDAY AS NEEDED FOR PAIN (Patient taking differently: Take 600 mg by mouth 3 (three) times daily as needed. TAKE 2 CAPSULES BY MOUTH TWICE DAILY, MAY TAKE AN ADDTIONAL 2 CAPSULES MIDDAY AS NEEDED FOR PAIN)   insulin glargine (LANTUS SOLOSTAR) 100 UNIT/ML Solostar Pen Inject 42 Units into the skin at bedtime.   Lancets [facility]  DELICA PLUS [id number]) MISC USE TO TEST BLOOD SUGAR FOUR TIMES DAILY (Patient taking differently: 1 Device by Other route in the morning, at noon, in the evening, and at bedtime. )   linaclotide (LINZESS) 290 MCG CAPS capsule Take 1 capsule (290 mcg total) by mouth daily before breakfast.   lisinopril (ZESTRIL) 20 MG tablet Take 1 tablet (20 mg total) by mouth daily.   metoprolol succinate (TOPROL-XL) 50 MG 24 hr tablet TAKE 1 TABLET BY MOUTH EVERY DAY WITH OR IMMEDIATELY FOLLOWING A MEAL   mupirocin ointment (BACTROBAN) 2 % Apply 1 application topically 2 (two) times daily. TO AFFECTED ARES ON BUTTOCKS   nystatin (MYCOSTATIN/NYSTOP) powder Apply topically 3 (three) times daily.   ONETOUCH VERIO test strip USE TO CHECK BLOOD SUGAR FOUR TIMES DAILY (Patient taking differently: 1 each by Other route in the morning, at noon, in the evening, and at bedtime. )   oxyCODONE-acetaminophen (PERCOCET) 7.5-325 MG tablet Take 1 tablet by mouth every 4 (four) hours as needed.   pantoprazole (PROTONIX) 40 MG tablet Take 1 tablet (40 mg total) by mouth daily.   simvastatin (ZOCOR) 20 MG tablet Take 1 tablet (20 mg total) by mouth at bedtime.   tamsulosin (FLOMAX) 0.4 MG CAPS capsule Take 1 capsule (0.4 mg total) by mouth daily.   tiZANidine (ZANAFLEX) 2 MG tablet TAKE 1 TABLET(2 MG) BY MOUTH TWICE DAILY AS NEEDED (Patient taking differently: Take 2 mg by mouth 2 (two) times daily as  needed for muscle spasms. )   Triamcinolone Acetonide (TRIAMCINOLONE 0.1 % CREAM : EUCERIN) CREA Apply 1 application topically 2 (two) times daily as needed. (Patient taking differently: Apply 1 application topically 2 (two) times daily as needed for rash. )   [DISCONTINUED] AMITIZA 24 MCG capsule TAKE 1 CAPSULE(24 MCG) BY MOUTH TWICE DAILY WITH A MEAL (Patient taking differently: Take 24 mcg by mouth daily. )   [DISCONTINUED] Cholecalciferol (VITAMIN D3) 125 MCG (5000 UT) CAPS Take 1 capsule (5,000 Units total) by mouth daily. (Patient taking differently: Take 5,000 Units by mouth every morning. )   [DISCONTINUED] metFORMIN (GLUCOPHAGE) 500 MG tablet TAKE 1 TABLET BY MOUTH TWICE DAILY (Patient taking differently: Take 500 mg by mouth daily. )   [DISCONTINUED] potassium chloride SA (KLOR-CON) 20 MEQ tablet TAKE 1 TABLET(20 MEQ) BY MOUTH DAILY (Patient taking differently: Take 20 mEq by mouth daily. )   No facility-administered encounter medications on file as of 05/18/2020.   ALLERGIES: No Known Allergies VACCINATION STATUS: Immunization History  Administered Date(s) Administered   Fluad Quad(high Dose 65+) 09/06/2019   Influenza Split 08/24/2012   Influenza, High Dose Seasonal PF 10/31/2017, 10/02/2018   Influenza,inj,Quad PF,6+ Mos 10/09/2013, 09/05/2014, 11/23/2015, 08/16/2016   Moderna SARS-COVID-2 Vaccination 01/19/2020, 02/19/2020   Pneumococcal Conjugate-13 01/07/2014   Pneumococcal Polysaccharide-23 11/25/2012   Tdap 09/18/2019    Diabetes He presents for his follow-up diabetic visit. He has type 2 diabetes mellitus. Onset time: diagnosed approx at age 85. His disease course has been stable. There are no hypoglycemic associated symptoms. Pertinent negatives for hypoglycemia include no confusion, pallor or seizures. Pertinent negatives for diabetes include no fatigue, no polydipsia, no polyphagia, no polyuria and no weakness. Symptoms are stable. Diabetic complications  include a CVA and nephropathy. Risk factors for coronary artery disease include diabetes mellitus, dyslipidemia, hypertension, male sex, obesity, sedentary lifestyle and tobacco exposure. Current diabetic treatment includes insulin injections. His weight is fluctuating minimally. He is following a generally unhealthy diet. When asked about meal planning, he reported  none. He has had a previous visit with a dietitian. He never participates in exercise. His home blood glucose trend is fluctuating minimally. His breakfast blood glucose range is generally 140-180 mg/dl. His bedtime blood glucose range is generally 180-200 mg/dl. His overall blood glucose range is 140-180 mg/dl. (His wife who is his caretaker presents his logs showing controlled glycemic profile to near target levels.  He is most recent A1c was 7.4%, remaining stable from last visit A1c of 7.7%.  ) An ACE inhibitor/angiotensin II receptor blocker is being taken.  Hyperlipidemia This is a chronic problem. The current episode started more than 1 year ago. The problem is uncontrolled. Recent lipid tests were reviewed and are variable. Exacerbating diseases include diabetes and obesity. Pertinent negatives include no myalgias. Risk factors for coronary artery disease include diabetes mellitus, dyslipidemia, hypertension, male sex and a sedentary lifestyle.    Review of systems: Limited as above.  Objective:    CMP  CMP Latest Ref Rng & Units 05/12/2020 04/10/2020 03/05/2020  Glucose 65 - 99 mg/dL 112(H) 105(H) 167(H)  BUN 7 - 25 mg/dL 28(H) 22 17  Creatinine 0.70 - 1.18 mg/dL 1.86(H) 1.67(H) 1.33(H)  Sodium 135 - 146 mmol/L 140 140 141  Potassium 3.5 - 5.3 mmol/L 4.6 4.2 4.1  Chloride 98 - 110 mmol/L 104 107 108  CO2 20 - 32 mmol/L 27 25 25   Calcium 8.6 - 10.3 mg/dL 11.2(H) 10.8(H) 10.2  Total Protein 6.1 - 8.1 g/dL 7.7 - -  Total Bilirubin 0.2 - 1.2 mg/dL 0.2 - -  Alkaline Phos 38 - 126 U/L - - -  AST 10 - 35 U/L 11 - -  ALT 9 - 46 U/L  8(L) - -    Diabetic Labs (most recent): Lab Results  Component Value Date   HGBA1C 7.4 (H) 03/03/2020   HGBA1C 6.1 (H) 10/08/2019   HGBA1C 6.3 (H) 06/06/2019   Lipid Panel     Component Value Date/Time   CHOL 138 05/12/2020 0948   TRIG 153 (H) 05/12/2020 0948   HDL 50 05/12/2020 0948   CHOLHDL 2.8 05/12/2020 0948   VLDL 66 (H) 05/04/2017 0931   LDLCALC 64 05/12/2020 0948     Assessment & Plan:   1. Type 2 diabetes mellitus with stage 2 chronic kidney disease and CVA -This is a telephone visit due to the coronavirus pandemic  His wife who is his caretaker presents his logs showing controlled glycemic profile to near target levels.  He is most recent A1c was 7.4%, remaining stable from last visit A1c of 7.7%.    -  His diabetes is complicated by recurrent CVA, stage 2-3 renal insufficiency, and he remains at extremely high risk for more acute and chronic complications of diabetes which include CAD, CVA, CKD, retinopathy, and neuropathy. -In May 2019, he was diagnosed with non-small cell lung cancer , status post multiple rounds of radiation treatment completed on May 30, 2018.    -As usual, he is assisted by his wife who is also his caretaker , advised to continue Lantus 42 units nightly.  We will continue to monitor blood glucose twice a day-daily before breakfast and at bedtime.    -Due to declining renal function, he is advised to discontinue Metformin for now.   -Patient is not a candidate for SGLT2 inhibitors due to CKD, nor full dose Metformin, not a suitable candidate for incretin therapy.   2) hypertension: -His blood pressure is controlled to target.  He is currently on  metoprolol 50 mg daily, lisinopril 20 mg daily.    3) Lipids/HPL: Recent lipid panel showed controlled LDL at 66.  He is advised to continue simvastatin 20 mg p.o. nightly.  Side effects and precautions discussed with him.    4) hypercalcemia- -review of his medical records indicate high PTH  associated with high calcium. Round of CKD, is suspect for tertiary hyperparathyroidism.  At this time, I advised to discontinue vitamin D.  Will be considered for Sensipar therapy.  Calcium remains above 11.5 on next measurement.    5) hypothyroidism:  -His recent labs on thyroid function tests are consistent with appropriate replacement.  He is advised to continue  levothyroxine 88 g by mouth every morning.  - We discussed about the correct intake of his thyroid hormone, on empty stomach at fasting, with water, separated by at least 30 minutes from breakfast and other medications,  and separated by more than 4 hours from calcium, iron, multivitamins, acid reflux medications (PPIs). -Patient is made aware of the fact that thyroid hormone replacement is needed for life, dose to be adjusted by periodic monitoring of thyroid function tests.    6) vitamin D deficiency -He is now vitamin D replete at 52.  Given his presentation with clinically significant hypercalcemia, he is advised to discontinue vitamin D3 .   - I advised patient to maintain close follow up with Dr. Buelah Manis for primary care needs.  - Time spent on this patient care encounter:  35 min, of which > 50% was spent in  counseling and the rest reviewing his blood glucose logs , discussing his hypoglycemia and hyperglycemia episodes, reviewing his current and  previous labs / studies  ( including abstraction from other facilities) and medications  doses and developing a  long term treatment plan and documenting his care.   Please refer to Patient Instructions for Blood Glucose Monitoring and Insulin/Medications Dosing Guide"  in media tab for additional information. Please  also refer to " Patient Self Inventory" in the Media  tab for reviewed elements of pertinent patient history.  [name] participated in the discussions, expressed understanding, and voiced agreement with the above plans.  All questions were answered to his  satisfaction. he is encouraged to contact clinic should he have any questions or concerns prior to his return visit.   Follow up plan: Return in about 3 months (around 08/18/2020) for F/U with Pre-visit Labs, NV A1c in Office.  Glade Lloyd, MD Phone: [phone]  Fax: [phone]  This note was partially dictated with voice recognition software. Similar sounding words can be transcribed inadequately or may not  be corrected upon review.  05/18/2020, 12:48 PM

## 2020-05-18 NOTE — Progress Notes (Signed)
Patient presents today for follow up after completing radiation to the left lung on 05/30/2018 and to review CT scan results from 05/18/2020  Patient denies any persistent cough, shortness of breath, or issues swallowing. He reports a healthy appetite and mild fatigue. He did go to ED back in March of this year, and other than right upper abdominal pain he denies any linger concerns/issues.   Vitals:   05/19/20 1351  BP: 120/80  Pulse: 65  Resp: 20  Temp: (!) 97.5 F (36.4 C)  SpO2: 100%    Wt Readings from Last 3 Encounters:  03/04/20 248 lb 3.8 oz (112.6 kg)  09/06/19 250 lb (113.4 kg)  07/15/19 200 lb (90.7 kg)

## 2020-05-19 ENCOUNTER — Other Ambulatory Visit: Payer: Self-pay

## 2020-05-19 ENCOUNTER — Encounter: Payer: Self-pay | Admitting: Radiation Oncology

## 2020-05-19 ENCOUNTER — Ambulatory Visit
Admission: RE | Admit: 2020-05-19 | Discharge: 2020-05-19 | Disposition: A | Discharge status: Home / Self Care | Payer: Medicare Other | Source: Ambulatory Visit | Attending: Radiation Oncology | Admitting: Radiation Oncology

## 2020-05-19 VITALS — BP 120/80 | HR 65 | Temp 97.5°F | Resp 20 | Ht 72.0 in

## 2020-05-19 DIAGNOSIS — Z85118 Personal history of other malignant neoplasm of bronchus and lung: Secondary | ICD-10-CM | POA: Insufficient documentation

## 2020-05-19 DIAGNOSIS — I712 Thoracic aortic aneurysm, without rupture: Secondary | ICD-10-CM | POA: Insufficient documentation

## 2020-05-19 DIAGNOSIS — C3412 Malignant neoplasm of upper lobe, left bronchus or lung: Secondary | ICD-10-CM

## 2020-05-19 DIAGNOSIS — R5383 Other fatigue: Secondary | ICD-10-CM | POA: Diagnosis not present

## 2020-05-19 DIAGNOSIS — I700 Atherosclerosis of aorta: Secondary | ICD-10-CM | POA: Diagnosis not present

## 2020-05-19 DIAGNOSIS — J439 Emphysema, unspecified: Secondary | ICD-10-CM | POA: Diagnosis not present

## 2020-05-19 DIAGNOSIS — Z794 Long term (current) use of insulin: Secondary | ICD-10-CM | POA: Insufficient documentation

## 2020-05-19 DIAGNOSIS — Z08 Encounter for follow-up examination after completed treatment for malignant neoplasm: Secondary | ICD-10-CM | POA: Diagnosis not present

## 2020-05-19 DIAGNOSIS — Z79899 Other long term (current) drug therapy: Secondary | ICD-10-CM | POA: Diagnosis not present

## 2020-05-19 NOTE — Progress Notes (Signed)
Radiation Oncology         [phone] ________________________________  Name: [name] MRN: [id number]  Date: 05/19/2020  DOB: 02-15-1946  Follow-Up outpatient note in person  CC: [place], [name], MD  [name], MD  Diagnosis and Prior Radiotherapy:    ICD-10-CM   1. Malignant neoplasm of upper lobe of left lung ([place])  C34.12      Non small cell lung cancer - adenocarcinoma Cancer Staging Malignant neoplasm of upper lobe of left lung ([place]) Staging form: Lung, AJCC 8th Edition - Clinical: Stage IB (cT2a, cN0, cM0) - Signed by [name], MD on 04/06/2018  05/21/2018 - 05/30/2018: Lung, Left/ 54 Gy delivered in 3 fractions of 18 Gy  CHIEF COMPLAINT:  lung cancer  Narrative: He is feeling relatively well without any new respiratory symptoms.  He reports a healthy appetite and mild fatigue.  He is here with his wife today.  They have both been vaccinated for Covid.  He is not smoking.   ALLERGIES:  has No Known Allergies.  Meds: Current Outpatient Medications  Medication Sig Dispense Refill  . allopurinol (ZYLOPRIM) 300 MG tablet Take 1 tablet (300 mg total) by mouth daily. 90 tablet 1  . amLODipine (NORVASC) 10 MG tablet TAKE 1 TABLET(10 MG) BY MOUTH DAILY (Patient taking differently: Take 10 mg by mouth daily. TAKE 1 TABLET(10 MG) BY MOUTH DAILY) 90 tablet 1  . cephALEXin (KEFLEX) 500 MG capsule Take 1 capsule (500 mg total) by mouth 2 (two) times daily. 14 capsule 0  . cloNIDine (CATAPRES) 0.2 MG tablet Take 1 tablet (0.2 mg total) by mouth 3 (three) times daily. 270 tablet 2  . diclofenac sodium (VOLTAREN) 1 % GEL Apply twice a day for affected area on ankle as needed (Patient taking differently: Apply 4 g topically 2 (two) times daily as needed. Apply twice a day for affected area on ankle as needed) 100 g 1  . furosemide (LASIX) 40 MG tablet Take 1 tablet (40 mg total) by mouth daily. 90 tablet 2  . gabapentin (NEURONTIN) 300 MG capsule TAKE 2 CAPSULES BY  MOUTH TWICE DAILY, MAY TAKE AN ADDTIONAL 2 CAPSULES MIDDAY AS NEEDED FOR PAIN (Patient taking differently: Take 600 mg by mouth 3 (three) times daily as needed. TAKE 2 CAPSULES BY MOUTH TWICE DAILY, MAY TAKE AN ADDTIONAL 2 CAPSULES MIDDAY AS NEEDED FOR PAIN) 540 capsule 0  . insulin glargine (LANTUS SOLOSTAR) 100 UNIT/ML Solostar Pen Inject 42 Units into the skin at bedtime. 15 mL 2  . Lancets (ONETOUCH DELICA PLUS [id number]) MISC USE TO TEST BLOOD SUGAR FOUR TIMES DAILY (Patient taking differently: 1 Device by Other route in the morning, at noon, in the evening, and at bedtime. ) 200 each 2  . linaclotide (LINZESS) 290 MCG CAPS capsule Take 1 capsule (290 mcg total) by mouth daily before breakfast. 30 capsule 1  . lisinopril (ZESTRIL) 20 MG tablet Take 1 tablet (20 mg total) by mouth daily. 90 tablet 2  . metoprolol succinate (TOPROL-XL) 50 MG 24 hr tablet TAKE 1 TABLET BY MOUTH EVERY DAY WITH OR IMMEDIATELY FOLLOWING A MEAL 90 tablet 0  . mupirocin ointment (BACTROBAN) 2 % Apply 1 application topically 2 (two) times daily. TO AFFECTED ARES ON BUTTOCKS 30 g 1  . nystatin (MYCOSTATIN/NYSTOP) powder Apply topically 3 (three) times daily. 60 g 2  . ONETOUCH VERIO test strip USE TO CHECK BLOOD SUGAR FOUR TIMES DAILY (Patient taking differently: 1 each by Other route in the  morning, at noon, in the evening, and at bedtime. ) 150 strip 5  . oxyCODONE-acetaminophen (PERCOCET) 7.5-325 MG tablet Take 1 tablet by mouth every 4 (four) hours as needed. 180 tablet 0  . pantoprazole (PROTONIX) 40 MG tablet Take 1 tablet (40 mg total) by mouth daily. 90 tablet 2  . simvastatin (ZOCOR) 20 MG tablet Take 1 tablet (20 mg total) by mouth at bedtime. 90 tablet 2  . tamsulosin (FLOMAX) 0.4 MG CAPS capsule Take 1 capsule (0.4 mg total) by mouth daily. 90 capsule 2  . tiZANidine (ZANAFLEX) 2 MG tablet TAKE 1 TABLET(2 MG) BY MOUTH TWICE DAILY AS NEEDED (Patient taking differently: Take 2 mg by mouth 2 (two) times daily as  needed for muscle spasms. ) 180 tablet 2  . Triamcinolone Acetonide (TRIAMCINOLONE 0.1 % CREAM : EUCERIN) CREA Apply 1 application topically 2 (two) times daily as needed. (Patient taking differently: Apply 1 application topically 2 (two) times daily as needed for rash. ) 1 each 2   No current facility-administered medications for this encounter.    Physical Findings:    height is 6' (1.829 m). His temperature is 97.5 F (36.4 C) (abnormal). His blood pressure is 120/80 and his pulse is 65. His respiration is 20 and oxygen saturation is 100%.      General: he is in no acute distress sitting in a wheelchair Lungs-decreased breath sounds bilaterally but no rhonchi wheezes or rales Heart- regular in rate and rhythm with a murmur most audible at the aortic valve region. Pitting edema in the bilateral lower extremities  Lab Findings: Lab Results  Component Value Date   WBC 10.3 04/10/2020   HGB 9.2 (L) 04/10/2020   HCT 31.1 (L) 04/10/2020   MCV 75.9 (L) 04/10/2020   PLT 414 (H) 04/10/2020    Radiographic Findings: CT Chest Wo Contrast  Result Date: 05/19/2020 CLINICAL DATA:  Non-small cell lung cancer.  Restaging. EXAM: CT CHEST WITHOUT CONTRAST TECHNIQUE: Multidetector CT imaging of the chest was performed following the standard protocol without IV contrast. COMPARISON:  11/08/2019 FINDINGS: Cardiovascular: Normal heart size. Increase caliber of the ascending thoracic aorta measures 4.2 cm on today's exam, image 76/3. Previously reported at 4.4 cm. Aortic atherosclerosis. Left main, lad, left circumflex coronary artery calcifications noted. Mediastinum/Nodes: Normal appearance of the thyroid gland. The trachea appears patent and is midline. Normal appearance of the esophagus. No enlarged mediastinal or hilar lymph nodes identified. Lungs/Pleura: No pleural effusion. Moderate paraseptal emphysema. Postsurgical changes from previous wedge resections noted within the right upper lobe. Masslike  architectural distortion within the lateral left upper lobe is again identified and appears unchanged in the interval. Adjacent pneumatocele versus chronic loculated pneumothorax is also unchanged in the interval measuring 6.2 cm, image 42/8. No new findings to suggest residual/recurrence of tumor. Upper Abdomen: No acute findings. Cholecystectomy. Bilateral kidney cysts identified. Musculoskeletal: Remote right posterior rib deformities. L2 compression fracture status post kyphoplasty. Unchanged. No new compression fractures. IMPRESSION: 1. Stable CT of the chest. No specific findings identified to suggest residual or recurrence of tumor. 2. Unchanged appearance of masslike architectural distortion within the lateral left upper lobe with adjacent pneumatocele versus chronic loculated pneumothorax. Findings compatible with post treatment/radiation change. 3. Aortic atherosclerosis. Multi vessel coronary artery calcifications noted. Aortic Atherosclerosis (ICD10-I70.0) and Emphysema (ICD10-J43.9). Electronically Signed   By: Kerby Moors M.D.   On: 05/19/2020 12:05    Impression/Plan:  NSCLC, in remission after radiation treatment  I reviewed the recent chest CT scan with  the patient and his wife today. No evidence of recurrence was seen.  Plan: Follow up in 6 months with a repeat Chest CT at that time.  Ascending Aortic aneurysm NOS: I have notified the patient's PCP about this and she will manage as warranted.  On Date of service, in total, I spent 20 minutes on this encounter.  The patient was seen in person.   _____________________________________   [name], MD

## 2020-05-23 ENCOUNTER — Other Ambulatory Visit: Payer: Self-pay | Admitting: Gastroenterology

## 2020-05-26 ENCOUNTER — Other Ambulatory Visit: Payer: Self-pay | Admitting: Family Medicine

## 2020-05-26 DIAGNOSIS — I10 Essential (primary) hypertension: Secondary | ICD-10-CM

## 2020-05-26 DIAGNOSIS — N182 Chronic kidney disease, stage 2 (mild): Secondary | ICD-10-CM

## 2020-05-26 DIAGNOSIS — E782 Mixed hyperlipidemia: Secondary | ICD-10-CM

## 2020-05-26 NOTE — Chronic Care Management (AMB) (Signed)
Chronic Care Management Pharmacy  Name: [name]  MRN: [id number] DOB: 16-Feb-1946  Chief Complaint/ HPI  [name],  74 y.o. , male presents for their Initial CCM visit with the clinical pharmacist via telephone.  PCP : [name], MD  Their chronic conditions include: hypertension, GERD/barretts Esophagus, Type II DM, hypothyroidism, CKD, hyperlipidemia, CVA.  Spent most of the time speaking with his wife [name] who is his primary care taker.  She is in charge of his medications and organizes them all for him.  His main concerns are pain in his buttocks region when touched, as well as constant pain in stomach.  Office Visits:  04/10/2020 [facility]) - wheelchair bound, given keflex for blisters on right wrist with staph  Consult Visit:  05/18/2020 Dorris Fetch, [place]) - diet is generally unhealthy, no exercise, fasting BG ranges 140-180 and bedtime ranges 180-200.  Metformin stopped due to declining renal function  03/18/2020 Bobby Rumpf, gastro) - currently taking Amitiza once daily cannot afford twice daily, also using Miralax prn but is not consistent with taking this, difficult candidate for colonoscopy due to gastroparesis, constipation, and immobility.  Medications: Outpatient Encounter Medications as of 05/29/2020  Medication Sig Note  . allopurinol (ZYLOPRIM) 300 MG tablet Take 1 tablet (300 mg total) by mouth daily.   [name] amLODipine (NORVASC) 10 MG tablet TAKE 1 TABLET(10 MG) BY MOUTH DAILY (Patient taking differently: Take 10 mg by mouth daily. TAKE 1 TABLET(10 MG) BY MOUTH DAILY)   . cloNIDine (CATAPRES) 0.2 MG tablet Take 1 tablet (0.2 mg total) by mouth 3 (three) times daily.   . diclofenac sodium (VOLTAREN) 1 % GEL Apply twice a day for affected area on ankle as needed (Patient taking differently: Apply 4 g topically 2 (two) times daily as needed. Apply twice a day for affected area on ankle as needed)   . furosemide (LASIX) 40 MG tablet Take 1 tablet (40 mg  total) by mouth daily.   [name] gabapentin (NEURONTIN) 300 MG capsule TAKE 2 CAPSULES BY MOUTH TWICE DAILY, MAY TAKE AN ADDTIONAL 2 CAPSULES MIDDAY AS NEEDED FOR PAIN (Patient taking differently: Take 600 mg by mouth 3 (three) times daily as needed. TAKE 2 CAPSULES BY MOUTH TWICE DAILY, MAY TAKE AN ADDTIONAL 2 CAPSULES MIDDAY AS NEEDED FOR PAIN)   . insulin glargine (LANTUS SOLOSTAR) 100 UNIT/ML Solostar Pen Inject 42 Units into the skin at bedtime. 03/03/2020: Patient took 42 units at 2000 on 03/02/20  . Lancets (ONETOUCH DELICA PLUS [id number]) MISC USE TO TEST BLOOD SUGAR FOUR TIMES DAILY (Patient taking differently: 1 Device by Other route in the morning, at noon, in the evening, and at bedtime. )   . LINZESS 290 MCG CAPS capsule TAKE 1 CAPSULE(290 MCG) BY MOUTH DAILY BEFORE BREAKFAST   . lisinopril (ZESTRIL) 20 MG tablet Take 1 tablet (20 mg total) by mouth daily.   . metoprolol succinate (TOPROL-XL) 50 MG 24 hr tablet TAKE 1 TABLET BY MOUTH EVERY DAY WITH OR IMMEDIATELY FOLLOWING A MEAL   . mupirocin ointment (BACTROBAN) 2 % Apply 1 application topically 2 (two) times daily. TO AFFECTED ARES ON BUTTOCKS   . nystatin (MYCOSTATIN/NYSTOP) powder Apply topically 3 (three) times daily.   [name] VERIO test strip USE TO CHECK BLOOD SUGAR FOUR TIMES DAILY (Patient taking differently: 1 each by Other route in the morning, at noon, in the evening, and at bedtime. )   . oxyCODONE-acetaminophen (PERCOCET) 7.5-325 MG tablet Take 1 tablet by mouth every 4 (four)  hours as needed.   . pantoprazole (PROTONIX) 40 MG tablet Take 1 tablet (40 mg total) by mouth daily.   . simvastatin (ZOCOR) 20 MG tablet Take 1 tablet (20 mg total) by mouth at bedtime.   . tamsulosin (FLOMAX) 0.4 MG CAPS capsule Take 1 capsule (0.4 mg total) by mouth daily.   [name] tiZANidine (ZANAFLEX) 2 MG tablet TAKE 1 TABLET(2 MG) BY MOUTH TWICE DAILY AS NEEDED   . Triamcinolone Acetonide (TRIAMCINOLONE 0.1 % CREAM : EUCERIN) CREA Apply 1 application  topically 2 (two) times daily as needed. (Patient taking differently: Apply 1 application topically 2 (two) times daily as needed for rash. )   . cephALEXin (KEFLEX) 500 MG capsule Take 1 capsule (500 mg total) by mouth 2 (two) times daily. (Patient not taking: Reported on 05/29/2020)   . [DISCONTINUED] tiZANidine (ZANAFLEX) 2 MG tablet TAKE 1 TABLET(2 MG) BY MOUTH TWICE DAILY AS NEEDED (Patient taking differently: Take 2 mg by mouth 2 (two) times daily as needed for muscle spasms. )    No facility-administered encounter medications on file as of 05/29/2020.     Current Diagnosis/Assessment:  Goals Addressed            This Visit's Progress   . Pharmacy Care Plan:       CARE PLAN ENTRY (see longitudinal plan of care for additional care plan information)  Current Barriers:  . Chronic Disease Management support, education, and care coordination needs related to Hypertension, Hyperlipidemia, Diabetes, and GERD   Hypertension BP Readings from Last 3 Encounters:  05/19/20 120/80  05/18/20 120/74  04/10/20 140/78   . Pharmacist Clinical Goal(s): o Over the next 90 days, patient will work with PharmD and providers to maintain BP goal <140/90 . Current regimen:  o Amlodipine 46m o Lisinopril 272mo Metoprolol succinate 5074mo Clonidine 0.2mg83mInterventions: o Reviewed medication regimen o Reviewed home BP readings . Patient self care activities - Over the next 90 days, patient will: o Check BP once daily, document, and provide at future appointments o Ensure daily salt intake < 2300 mg/day o Contact pharmacy for fill of metoprolol to continue medication adherence  Hyperlipidemia Lab Results  Component Value Date/Time   LDLCALC 64 05/12/2020 09:48 AM   . Pharmacist Clinical Goal(s): o Over the next 90 days, patient will work with PharmD and providers to maintain LDL goal < 70 . Current regimen:  o Simvastatin 20mg79mnterventions: o Comprehensive medication  review o Reviewed most recent lab results . Patient self care activities - Over the next 90 days, patient will: o Continue to focus on medication adherence by pill box o Contact PharmD or PCP with any questions or concerns  Diabetes Lab Results  Component Value Date/Time   HGBA1C 7.4 (H) 03/03/2020 08:15 AM   HGBA1C 6.1 (H) 10/08/2019 08:29 AM   HGBA1C 6.7 12/03/2015 12:00 AM   . Pharmacist Clinical Goal(s): o Over the next 90 days, patient will work with PharmD and providers to achieve A1c goal <7% . Current regimen:  o Lantus 42 units at bedtime . Interventions: o Reviewed home blood pressure logs o Discussed diet and limiting sweet foods to once or twice weekly . Patient self care activities - Over the next 90 days, patient will: o Check blood sugar twice daily, document, and provide at future appointments o Contact provider with any episodes of hypoglycemia o Contact provider with blood sugar > 130 in the mornings or > 180 2 hours after meals  o Limit consumption of sweet foods to once or twice weekly  GERD . Pharmacist Clinical Goal(s) o Over the next 90 days, patient will work with PharmD and providers to optimize medication and minimize symptoms related to GERD . Current regimen:  o Pantoprazole 60m daily . Interventions: o Discussed medication timing o Reviewed severity of patient symptoms . Patient self care activities - Over the next 90 days, patient will: o Continue to take medication as directed o Contact PharmD or PCP with any change in symptoms   Initial goal documentation        Diabetes   Recent Relevant Labs: Lab Results  Component Value Date/Time   HGBA1C 7.4 (H) 03/03/2020 08:15 AM   HGBA1C 6.1 (H) 10/08/2019 08:29 AM   HGBA1C 6.7 12/03/2015 12:00 AM   MICROALBUR 123.0 05/12/2020 09:48 AM   MICROALBUR 232.3 05/04/2017 09:31 AM     Checking BG: 2x per Day  Recent FBG Readings: 118 this morning, 85-140 is the range   Recent HS BG readings:  182 last night, 161,146 highest was 224 Patient has failed these meds in past:  Patient is currently uncontrolled based on last A1c, controlled based on home BG readings on the following medications: Lantus 100units/mL 42 units hs (appropriate, safe, effective, accessible)  Last diabetic Foot exam:  Lab Results  Component Value Date/Time   HMDIABEYEEXA No Retinopathy 10/16/2018 12:00 AM    Last diabetic Eye exam: No results found for: HMDIABFOOTEX   [facility], reports no episodes of hypoglycemia.  Did d/c metformin per endocrinology. High blood sugars directly related to diet choices.  Plan  Continue current medications, limit servings of sweets to once or twice weekly. Hypertension    Office blood pressures are  BP Readings from Last 3 Encounters:  05/19/20 120/80  05/18/20 120/74  04/10/20 140/78    Patient has failed these meds in the past: none noted  Patient checks BP at home daily  Patient home BP readings are ranging: 135/85 78 today, no logs available [facility]  Patient is currently controlled on the following medications:  Amlodipine 165mdaily - AM  Lisinopril 2040maily - AM  Metoprolol Succinate 50m41mily  - currently none on hand  Clonidine 0.2mg-35mD (All meds, appropriate, safe, effective, accessible)  Currently patient has no metoprolol on hand, reports high BP last week [facility]  Patient reaching out to pharmacy to see about refill on metoprolol.  Plan  Continue current medications.  Refill metoprolol at pharmacy.     Hyperlipidemia   Lipid Panel     Component Value Date/Time   CHOL 138 05/12/2020 0948   TRIG 153 (H) 05/12/2020 0948   HDL 50 05/12/2020 0948   LDLCALC 64 05/12/2020 0948    Hepatic Function Latest Ref Rng & Units 05/12/2020 03/03/2020 10/08/2019  Total Protein 6.1 - 8.1 g/dL 7.7 6.7 6.4  Albumin 3.5 - 5.0 g/dL - 3.3(L) -  AST 10 - 35 U/L  11 14(L) 10  ALT 9 - 46 U/L 8(L) 11 9  Alk Phosphatase 38 - 126 U/L - 78 -  Total Bilirubin 0.2 - 1.2 mg/dL 0.2 0.3 0.3  Bilirubin, Direct <=0.2 mg/dL - - -     The 10-year ASCVD risk score (GoffMikey Bussingr., et al., 2013) is: 29.7%   Values used to calculate the score:     Age: 19 ye60s     Sex:  Male     Is Non-Hispanic African American: Yes     Diabetic: Yes     Tobacco smoker: No     Systolic Blood Pressure: 960 mmHg     Is BP treated: Yes     HDL Cholesterol: 50 mg/dL     Total Cholesterol: 138 mg/dL   Patient has failed these meds in past: none noted Patient is currently controlled on the following medications:  . Simvastatin 34m (appropriate, safe, effective, accessible)  Patient takes as directed, no concerns, LDL at goal.  Plan  Continue current medications Hypothyroidism   Lab Results  Component Value Date/Time   TSH 2.68 05/12/2020 09:48 AM   TSH 2.39 06/06/2019 08:39 AM   TSH 2.60 10/25/2018 09:35 AM    Patient has failed these meds in past: levothyroxine 543m Patient is currently controlled on the following medications:  . None  Recent TSH normal on no medications  Plan  Continue to monitor at least yearly.  GERD/Barretts Esophagus    Patient has failed these meds in past: none noted Patient is currently controlled on the following medications:  Pantoprazole 4027mNo s/sx of acid reflux, valid indication for continued therapy  Plan  Continue current medications  CKD   Kidney Function Lab Results  Component Value Date/Time   CREATININE 1.86 (H) 05/12/2020 09:48 AM   CREATININE 1.67 (H) 04/10/2020 10:30 AM   GFRNONAA 35 (L) 05/12/2020 09:48 AM   GFRAA 41 (L) 05/12/2020 09:48 AM   K 4.6 05/12/2020 09:48 AM   K 4.2 04/10/2020 10:30 AM   K 4.3 08/16/2011 09:57 AM    Patient has failed these meds in past: metformin (d/c by endo d/t renal function)   All medications evaluated for safety with current renal function.  Plan  Continue  current medications.  Vaccines   Reviewed and discussed patient's vaccination history.    Immunization History  Administered Date(s) Administered  . Fluad Quad(high Dose 65+) 09/06/2019  . Influenza Split 08/24/2012  . Influenza, High Dose Seasonal PF 10/31/2017, 10/02/2018  . Influenza,inj,Quad PF,6+ Mos 10/09/2013, 09/05/2014, 11/23/2015, 08/16/2016  . Moderna SARS-COVID-2 Vaccination 01/19/2020, 02/19/2020  . Pneumococcal Conjugate-13 01/07/2014  . Pneumococcal Polysaccharide-23 11/25/2012  . Tdap 09/18/2019    Plan  Up to date on all preventative vaccines.  Medication Management   . Miscellaneous medications: furosemide 5m27mllopurinol 300mg7mbapentin 300mg,49msulosin 0.4mg . 24m's: NONE . [facility].  Phone #  [phone] reports using pill box organized by his wife method to organize medications and promote adherence. . Patient denies missed doses of medication. . Patient interested in [facility] pharmacy delivery and synchronization - performed medication cost review and with current insurance [facility] is preferred and [facility] standard so would incur more cost.  [facility]   ChristiBeverly MilchD Clinical Pharmacist [phone]

## 2020-05-27 ENCOUNTER — Other Ambulatory Visit: Payer: Self-pay | Admitting: Family Medicine

## 2020-05-28 NOTE — Telephone Encounter (Signed)
Ok to refill 

## 2020-05-29 ENCOUNTER — Ambulatory Visit: Payer: Medicare Other | Admitting: Pharmacist

## 2020-05-29 ENCOUNTER — Other Ambulatory Visit: Payer: Self-pay

## 2020-05-29 ENCOUNTER — Other Ambulatory Visit: Payer: Self-pay | Admitting: *Deleted

## 2020-05-29 DIAGNOSIS — I10 Essential (primary) hypertension: Secondary | ICD-10-CM

## 2020-05-29 DIAGNOSIS — N182 Chronic kidney disease, stage 2 (mild): Secondary | ICD-10-CM

## 2020-05-29 DIAGNOSIS — E782 Mixed hyperlipidemia: Secondary | ICD-10-CM

## 2020-05-29 DIAGNOSIS — K219 Gastro-esophageal reflux disease without esophagitis: Secondary | ICD-10-CM

## 2020-05-29 DIAGNOSIS — E1122 Type 2 diabetes mellitus with diabetic chronic kidney disease: Secondary | ICD-10-CM

## 2020-05-29 MED ORDER — METOPROLOL SUCCINATE ER 50 MG PO TB24: ORAL_TABLET | ORAL | 0 refills | Status: DC

## 2020-05-29 NOTE — Patient Instructions (Addendum)
Visit Information Thank you for meeting with me today!  I look forward to working with you to help you meet all of your healthcare goals and answer any questions you may have.  Feel free to contact me anytime!  Goals Addressed            This Visit's Progress   . Pharmacy Care Plan:       CARE PLAN ENTRY (see longitudinal plan of care for additional care plan information)  Current Barriers:  . Chronic Disease Management support, education, and care coordination needs related to Hypertension, Hyperlipidemia, Diabetes, and GERD   Hypertension BP Readings from Last 3 Encounters:  05/19/20 120/80  05/18/20 120/74  04/10/20 140/78   . Pharmacist Clinical Goal(s): o Over the next 90 days, patient will work with PharmD and providers to maintain BP goal <140/90 . Current regimen:  o Amlodipine 38m o Lisinopril 275mo Metoprolol succinate 5013mo Clonidine 0.2mg64mInterventions: o Reviewed medication regimen o Reviewed home BP readings . Patient self care activities - Over the next 90 days, patient will: o Check BP once daily, document, and provide at future appointments o Ensure daily salt intake < 2300 mg/day o Contact pharmacy for fill of metoprolol to continue medication adherence  Hyperlipidemia Lab Results  Component Value Date/Time   LDLCALC 64 05/12/2020 09:48 AM   . Pharmacist Clinical Goal(s): o Over the next 90 days, patient will work with PharmD and providers to maintain LDL goal < 70 . Current regimen:  o Simvastatin 20mg78mnterventions: o Comprehensive medication review o Reviewed most recent lab results . Patient self care activities - Over the next 90 days, patient will: o Continue to focus on medication adherence by pill box o Contact PharmD or PCP with any questions or concerns  Diabetes Lab Results  Component Value Date/Time   HGBA1C 7.4 (H) 03/03/2020 08:15 AM   HGBA1C 6.1 (H) 10/08/2019 08:29 AM   HGBA1C 6.7 12/03/2015 12:00 AM   . Pharmacist  Clinical Goal(s): o Over the next 90 days, patient will work with PharmD and providers to achieve A1c goal <7% . Current regimen:  o Lantus 42 units at bedtime . Interventions: o Reviewed home blood pressure logs o Discussed diet and limiting sweet foods to once or twice weekly . Patient self care activities - Over the next 90 days, patient will: o Check blood sugar twice daily, document, and provide at future appointments o Contact provider with any episodes of hypoglycemia o Contact provider with blood sugar > 130 in the mornings or > 180 2 hours after meals o Limit consumption of sweet foods to once or twice weekly  GERD . Pharmacist Clinical Goal(s) o Over the next 90 days, patient will work with PharmD and providers to optimize medication and minimize symptoms related to GERD . Current regimen:  o Pantoprazole 40mg 81my . Interventions: o Discussed medication timing o Reviewed severity of patient symptoms . Patient self care activities - Over the next 90 days, patient will: o Continue to take medication as directed o Contact PharmD or PCP with any change in symptoms   Initial goal documentation        Mr. Alfredo Alcindoriven information about Chronic Care Management services today including:  1. CCM service includes personalized support from designated clinical staff supervised by his physician, including individualized plan of care and coordination with other care providers 2. 24/7 contact phone numbers for assistance for urgent and routine care needs. 3. Standard  insurance, coinsurance, copays and deductibles apply for chronic care management only during months in which we provide at least 20 minutes of these services. Most insurances cover these services at 100%, however patients may be responsible for any copay, coinsurance and/or deductible if applicable. This service may help you avoid the need for more expensive face-to-face services. 4. Only one practitioner may furnish  and bill the service in a calendar month. 5. The patient may stop CCM services at any time (effective at the end of the month) by phone call to the office staff.  Patient agreed to services and verbal consent obtained.   The patient verbalized understanding of instructions provided today and agreed to receive a mailed copy of patient instruction and/or educational materials. Telephone follow up appointment with pharmacy team member scheduled for: 09/03/2020 @ 12: [place], PharmD Clinical Pharmacist [facility] [phone]  Preventing Hypertension Hypertension, commonly called high blood pressure, is when the force of blood pumping through the arteries is too strong. Arteries are blood vessels that carry blood from the heart throughout the body. Over time, hypertension can damage the arteries and decrease blood flow to important parts of the body, including the brain, heart, and kidneys. Often, hypertension does not cause symptoms until blood pressure is very high. For this reason, it is important to have your blood pressure checked on a regular basis. Hypertension can often be prevented with diet and lifestyle changes. If you already have hypertension, you can control it with diet and lifestyle changes, as well as medicine. What nutrition changes can be made? Maintain a healthy diet. This includes:  Eating less salt (sodium). Ask your health care provider how much sodium is safe for you to have. The general recommendation is to consume less than 1 tsp (2,300 mg) of sodium a day. ? Do not add salt to your food. ? Choose low-sodium options when grocery shopping and eating out.  Limiting fats in your diet. You can do this by eating low-fat or fat-free dairy products and by eating less red meat.  Eating more fruits, vegetables, and whole grains. Make a goal to eat: ? 1-2 cups of fresh fruits and vegetables each day. ? 3-4 servings of whole grains each  day.  Avoiding foods and beverages that have added sugars.  Eating fish that contain healthy fats (omega-3 fatty acids), such as mackerel or salmon. If you need help putting together a healthy eating plan, try the DASH diet. This diet is high in fruits, vegetables, and whole grains. It is low in sodium, red meat, and added sugars. DASH stands for Dietary Approaches to Stop Hypertension. What lifestyle changes can be made?   Lose weight if you are overweight. Losing just 3?5% of your body weight can help prevent or control hypertension. ? For example, if your present weight is 200 lb (91 kg), a loss of 3-5% of your weight means losing 6-10 lb (2.7-4.5 kg). ? Ask your health care provider to help you with a diet and exercise plan to safely lose weight.  Get enough exercise. Do at least 150 minutes of moderate-intensity exercise each week. ? You could do this in short exercise sessions several times a day, or you could do longer exercise sessions a few times a week. For example, you could take a brisk 10-minute walk or bike ride, 3 times a day, for 5 days a week.  Find ways to reduce stress, such as exercising, meditating, listening to music, or  taking a yoga class. If you need help reducing stress, ask your health care provider.  Do not smoke. This includes e-cigarettes. Chemicals in tobacco and nicotine products raise your blood pressure each time you smoke. If you need help quitting, ask your health care provider.  Avoid alcohol. If you drink alcohol, limit alcohol intake to no more than 1 drink a day for nonpregnant women and 2 drinks a day for men. One drink equals 12 oz of beer, 5 oz of wine, or 1 oz of hard liquor. Why are these changes important? Diet and lifestyle changes can help you prevent hypertension, and they may make you feel better overall and improve your quality of life. If you have hypertension, making these changes will help you control it and help prevent major  complications, such as:  Hardening and narrowing of arteries that supply blood to: ? Your heart. This can cause a heart attack. ? Your brain. This can cause a stroke. ? Your kidneys. This can cause kidney failure.  Stress on your heart muscle, which can cause heart failure. What can I do to lower my risk?  Work with your health care provider to make a hypertension prevention plan that works for you. Follow your plan and keep all follow-up visits as told by your health care provider.  Learn how to check your blood pressure at home. Make sure that you know your personal target blood pressure, as told by your health care provider. How is this treated? In addition to diet and lifestyle changes, your health care provider may recommend medicines to help lower your blood pressure. You may need to try a few different medicines to find what works best for you. You also may need to take more than one medicine. Take over-the-counter and prescription medicines only as told by your health care provider. Where to find support Your health care provider can help you prevent hypertension and help you keep your blood pressure at a healthy level. Your local hospital or your community may also provide support services and prevention programs. The American Heart Association offers an online support network at: CheapBootlegs.com.cy Where to find more information Learn more about hypertension from:  [place], Lung, and Blood Institute: ElectronicHangman.is  Centers for Disease Control and Prevention: https://ingram.com/  American Academy of Family Physicians: http://familydoctor.org/familydoctor/en/diseases-conditions/high-blood-pressure.printerview.all.html Learn more about the DASH diet from:  [place], Lung, and [place]: https://www.reyes.com/ Contact a health care provider if:  You think you  are having a reaction to medicines you have taken.  You have recurrent headaches or feel dizzy.  You have swelling in your ankles.  You have trouble with your vision. Summary  Hypertension often does not cause any symptoms until blood pressure is very high. It is important to get your blood pressure checked regularly.  Diet and lifestyle changes are the most important steps in preventing hypertension.  By keeping your blood pressure in a healthy range, you can prevent complications like heart attack, heart failure, stroke, and kidney failure.  Work with your health care provider to make a hypertension prevention plan that works for you. This information is not intended to replace advice given to you by your health care provider. Make sure you discuss any questions you have with your health care provider. Document Revised: 03/22/2019 Document Reviewed: 08/08/2016 Elsevier Patient Education  2020 [employer].

## 2020-06-10 ENCOUNTER — Ambulatory Visit (INDEPENDENT_AMBULATORY_CARE_PROVIDER_SITE_OTHER): Payer: Medicare Other | Admitting: Family Medicine

## 2020-06-10 ENCOUNTER — Encounter: Payer: Self-pay | Admitting: Family Medicine

## 2020-06-10 ENCOUNTER — Telehealth: Payer: Self-pay | Admitting: *Deleted

## 2020-06-10 ENCOUNTER — Other Ambulatory Visit: Payer: Self-pay

## 2020-06-10 VITALS — BP 148/82 | HR 98 | Temp 98.3°F | Resp 18

## 2020-06-10 DIAGNOSIS — D62 Acute posthemorrhagic anemia: Secondary | ICD-10-CM

## 2020-06-10 DIAGNOSIS — L89152 Pressure ulcer of sacral region, stage 2: Secondary | ICD-10-CM | POA: Diagnosis not present

## 2020-06-10 DIAGNOSIS — K5909 Other constipation: Secondary | ICD-10-CM

## 2020-06-10 DIAGNOSIS — I10 Essential (primary) hypertension: Secondary | ICD-10-CM

## 2020-06-10 DIAGNOSIS — R531 Weakness: Secondary | ICD-10-CM | POA: Diagnosis not present

## 2020-06-10 MED ORDER — CEPHALEXIN 500 MG PO CAPS: 500.0000 mg | ORAL_CAPSULE | Freq: Two times a day (BID) | ORAL | 0 refills | Status: DC

## 2020-06-10 MED ORDER — METOPROLOL SUCCINATE ER 50 MG PO TB24: ORAL_TABLET | ORAL | 0 refills | Status: DC

## 2020-06-10 NOTE — Patient Instructions (Signed)
Home Health referral For constipation-increase miralax to twice a day Continue linzess We will call with lab results Antibiotics prescribed  For blood pressure increase metoprolol to 1 and 1/2 tablets once a day  F/U as previous

## 2020-06-10 NOTE — Assessment & Plan Note (Signed)
Blood pressure is uncontrolled.  Will increase metoprolol 79m to 1-1/2 tablets once a day.  He will continue the clonidine norvasc  Lasix lisinopril as prescribed.  We will check his renal function again today.  We will also check his hemoglobin with a concern for his ongoing weakness.

## 2020-06-10 NOTE — Progress Notes (Signed)
Subjective:    Patient ID: [name], male    DOB: 07-01-1946, 74 y.o.   MRN: [id number]  Patient presents for Wound to Bottom   Bp has been high, has been 150 -170's, rarely in 140's / 80-90's  He is taking clonidine three times a day, norvasc, lisinopril, metoprolol as prescribed    No BM for 5 days, has small one today, still feels full   Normal urination          Seen by Endocrinology, taken Off potassium, off metformin /calvium and Vitamin D due to worsening renal function Kidney function increased to 1.86  Nose drains has tried nasal saline /nasacort  Has sore on his bottom is in the gluteal cleft.  His wife noticed that after he had to be picked up on the floor when he slid down trying to get to the bathroom.  He seems to be weaker here the past few weeks.  He complains of abdominal pain all the time.  He is actually been set up at [facility] but that appointment is not going to be until September to decide on doing further work-up for his GI bleed.  Review Of Systems:  GEN- denies fatigue, fever, weight loss,+weakness, recent illness HEENT- denies eye drainage, change in vision, nasal discharge, CVS- denies chest pain, palpitations RESP- denies SOB, cough, wheeze ABD- denies N/V, change in stools,+ abd pain GU- denies dysuria, hematuria, dribbling, incontinence MSK- denies joint pain, muscle aches, injury Neuro- denies headache, dizziness, syncope, seizure activity       Objective:    BP (!) 148/82   Pulse 98   Temp 98.3 F (36.8 C) (Temporal)   Resp 18   SpO2 97%  GEN- NAD, alert and oriented x3, sitting in wheelchair HEENT- PERRL, EOMI, non injected sclera, pink conjunctiva, MMM, oropharynx clear Neck- Supple, no thyromegaly, no LAD CVS- RRR, no murmur RESP-CTAB ABD-NABS,soft,chronic TTP RUQ  no rebound, no guarding Skin- erythema with moisture maceration gluteal cleft,  TTP over sacrum, stage 2 ulceration in gluteal cleft approx2-3 inches  long with macerated skin surrounding, erythema  EXT- chronic non pitting  edema Pulses- Radial 2+      Assessment & Plan:      Problem List Items Addressed This Visit      Unprioritized   Chronic constipation    He is on high-dose Linzess from gastroenterology.  He is also taking MiraLAX once a day.  We will increase this to twice a day to help with his bowel movements.      HTN (hypertension)    Blood pressure is uncontrolled.  Will increase metoprolol 90m to 1-1/2 tablets once a day.  He will continue the clonidine norvasc  Lasix lisinopril as prescribed.  We will check his renal function again today.  We will also check his hemoglobin with a concern for his ongoing weakness.      Relevant Medications   metoprolol succinate (TOPROL-XL) 50 MG 24 hr tablet    Other Visit Diagnoses    Decubitus ulcer of sacral region, stage 2 ([place]    -  Primary   Current stage II ulcer peer we will get home health involved.  His wife is unable to manipulate him because of his size to care for this.  He is barely able to transport or stand.  DuoDERM was applied to the wound today.  There was some odor with mild redness so I will go ahead and cover him with Keflex as  well.   Weakness       Relevant Orders   Basic metabolic panel   Anemia associated with acute blood loss       Recheck hemoglobin per above.   Relevant Orders   CBC with Differential/Platelet   Basic metabolic panel   Iron      Note: This dictation was prepared with Dragon dictation along with smaller phrase technology. Any transcriptional errors that result from this process are unintentional.

## 2020-06-10 NOTE — Assessment & Plan Note (Signed)
He is on high-dose Linzess from gastroenterology.  He is also taking MiraLAX once a day.  We will increase this to twice a day to help with his bowel movements.

## 2020-06-10 NOTE — Telephone Encounter (Signed)
Received call from patient wife, [name].   Reports that patient has open area to rectum. Requested ABTx.   Advised to have patient come to office for evaluation. Appointment scheduled.

## 2020-06-11 LAB — CBC WITH DIFFERENTIAL/PLATELET
Absolute Monocytes: 603 cells/uL (ref 200–950)
Basophils Absolute: 54 cells/uL (ref 0–200)
Basophils Relative: 0.6 %
Eosinophils Absolute: 891 cells/uL — ABNORMAL HIGH (ref 15–500)
Eosinophils Relative: 9.9 %
HCT: 37.3 % — ABNORMAL LOW (ref 38.5–50.0)
Hemoglobin: 10.9 g/dL — ABNORMAL LOW (ref 13.2–17.1)
Lymphs Abs: 1413 cells/uL (ref 850–3900)
MCH: 20.5 pg — ABNORMAL LOW (ref 27.0–33.0)
MCHC: 29.2 g/dL — ABNORMAL LOW (ref 32.0–36.0)
MCV: 70.1 fL — ABNORMAL LOW (ref 80.0–100.0)
Monocytes Relative: 6.7 %
Neutro Abs: 6039 cells/uL (ref 1500–7800)
Neutrophils Relative %: 67.1 %
Platelets: 353 10*3/uL (ref 140–400)
RBC: 5.32 10*6/uL (ref 4.20–5.80)
RDW: 20 % — ABNORMAL HIGH (ref 11.0–15.0)
Total Lymphocyte: 15.7 %
WBC: 9 10*3/uL (ref 3.8–10.8)

## 2020-06-11 LAB — BASIC METABOLIC PANEL
BUN/Creatinine Ratio: 15 (calc) (ref 6–22)
BUN: 26 mg/dL — ABNORMAL HIGH (ref 7–25)
CO2: 22 mmol/L (ref 20–32)
Calcium: 11.1 mg/dL — ABNORMAL HIGH (ref 8.6–10.3)
Chloride: 104 mmol/L (ref 98–110)
Creat: 1.77 mg/dL — ABNORMAL HIGH (ref 0.70–1.18)
Glucose, Bld: 122 mg/dL — ABNORMAL HIGH (ref 65–99)
Potassium: 4.3 mmol/L (ref 3.5–5.3)
Sodium: 140 mmol/L (ref 135–146)

## 2020-06-11 LAB — IRON: Iron: 44 ug/dL — ABNORMAL LOW (ref 50–180)

## 2020-06-16 ENCOUNTER — Other Ambulatory Visit: Payer: Self-pay | Admitting: *Deleted

## 2020-06-16 MED ORDER — POLYSACCHARIDE IRON COMPLEX 150 MG PO CAPS: 150.0000 mg | ORAL_CAPSULE | Freq: Every day | ORAL | 3 refills | Status: DC

## 2020-06-24 IMAGING — CT CT CHEST W/O CM
2 of 4 series · 14 of 36 positions shown, 17 images · non-contrast
Comparison: Chest CT 05/09/2019.

CLINICAL DATA: 73-year-old male with history of non-small cell lung
cancer. Follow-up study.

EXAM:
CT CHEST WITHOUT CONTRAST
TECHNIQUE: Multidetector CT imaging of the chest was performed following the
standard protocol without IV contrast.

[Series 2: routine chest without · axial · non-contrast · 0.87mm/px · z∈[+1194,+1510]mm · 11 of 188 slices shown, 14 images]
[im 15/188  mediastinal]
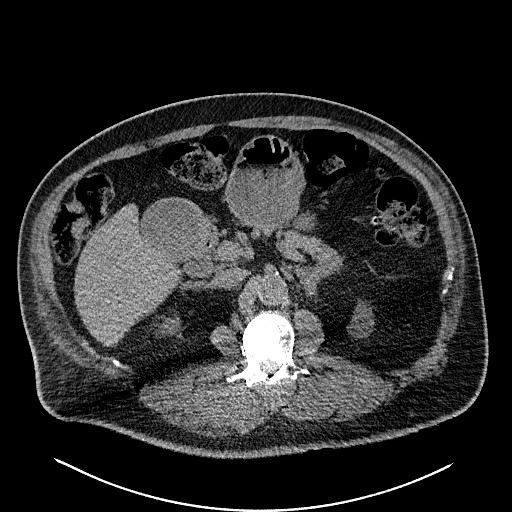
[im 15/188  lung]
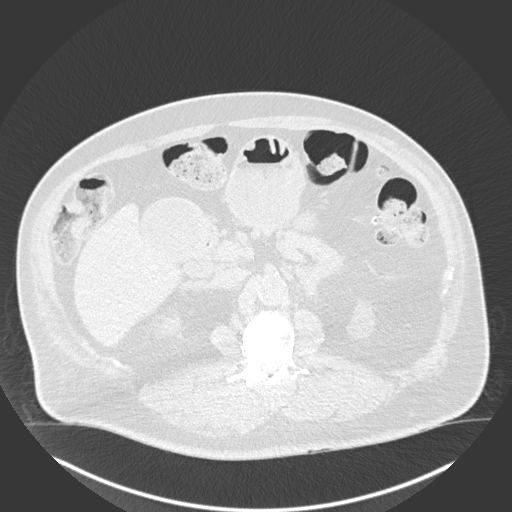
[im 29/188  lung]
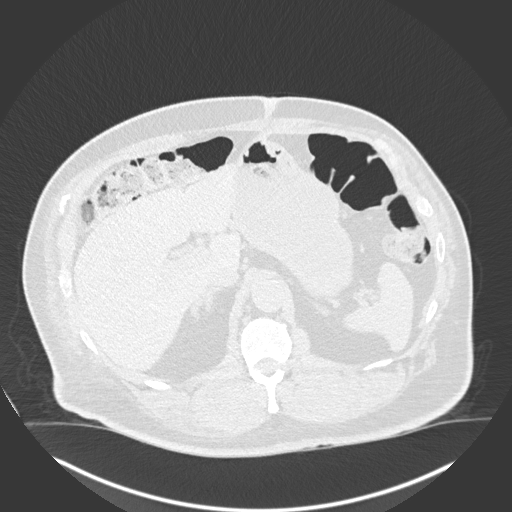
[im 44/188  lung]
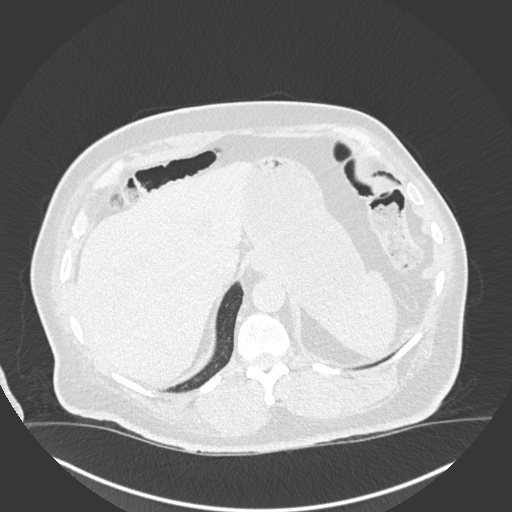
[im 58/188  lung]
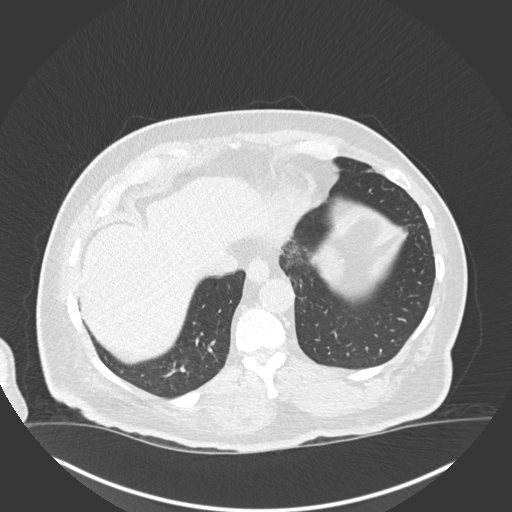
[im 72/188  mediastinal]
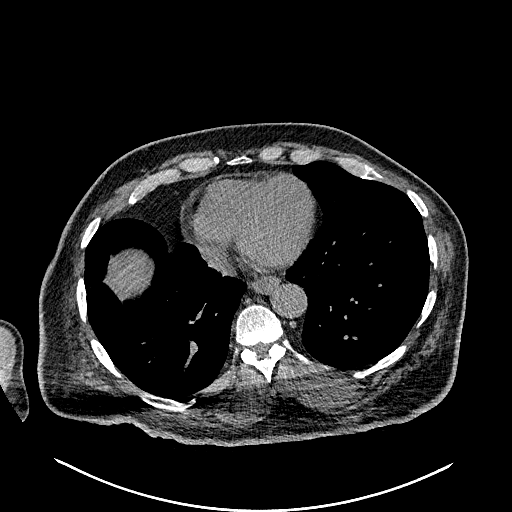
[im 72/188  lung]
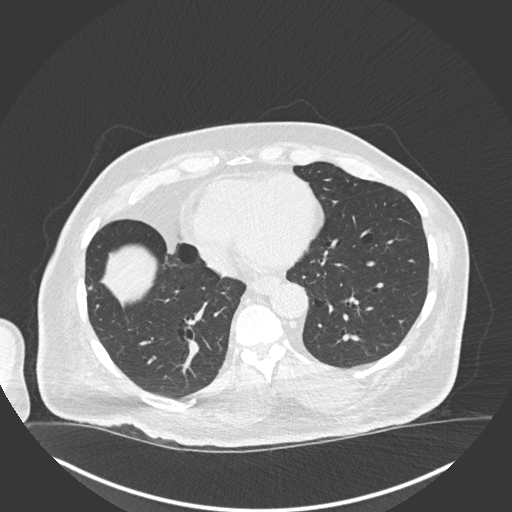
[im 101/188  lung]
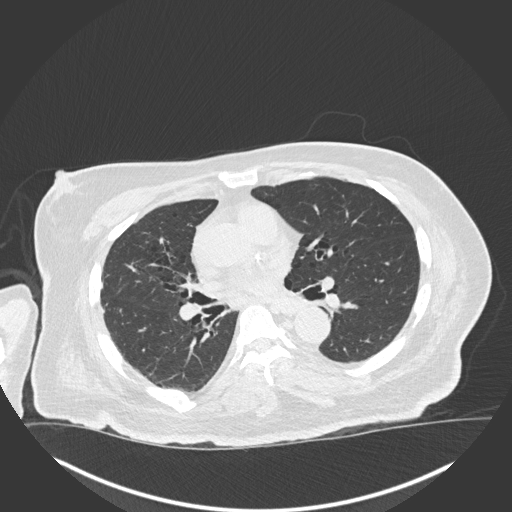
[im 116/188  lung]
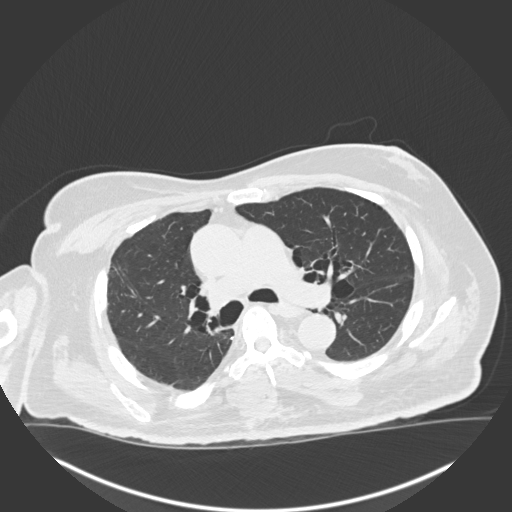
[im 130/188  lung]
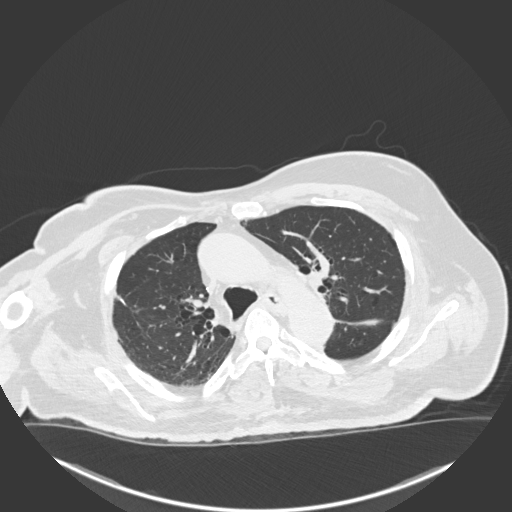
[im 144/188  mediastinal]
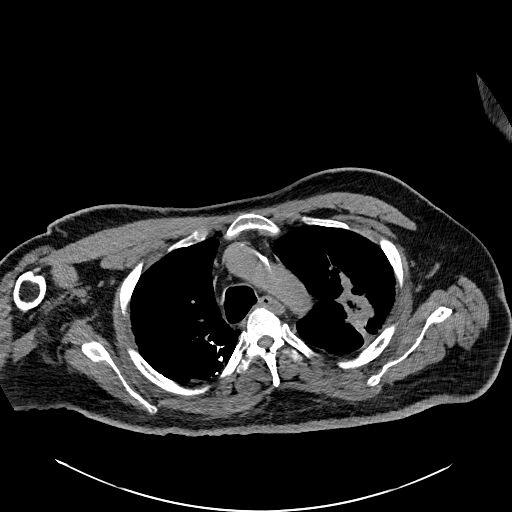
[im 144/188  lung]
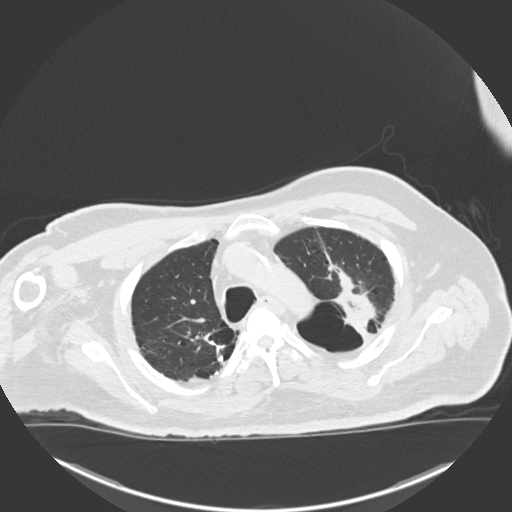
[im 159/188  lung]
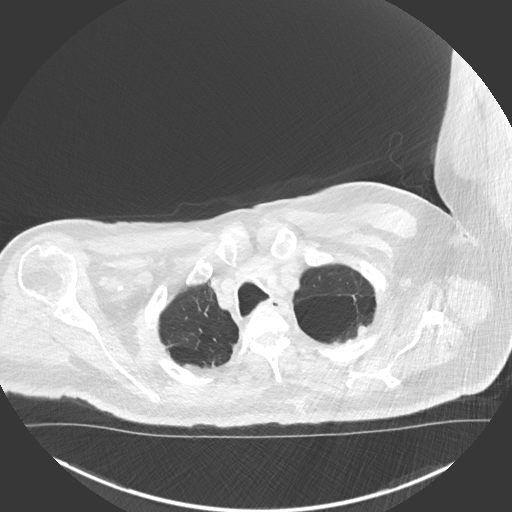
[im 173/188  lung]
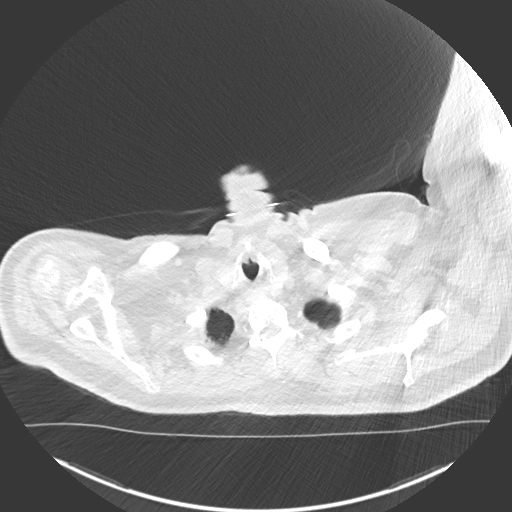

[Series 5: coronal · coronal · 0.77mm/px · 3 of 161 slices shown]
[im 33/161  lung]
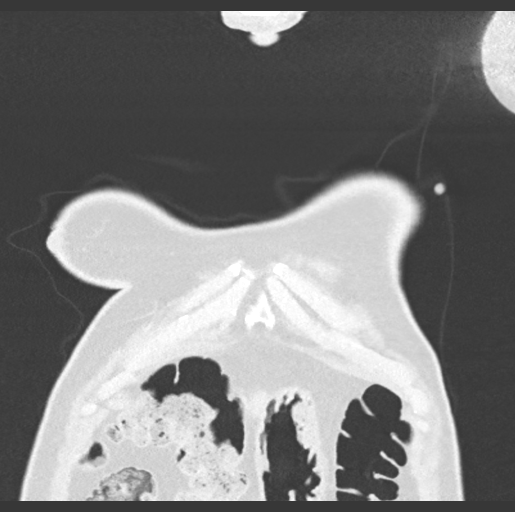
[im 65/161  lung]
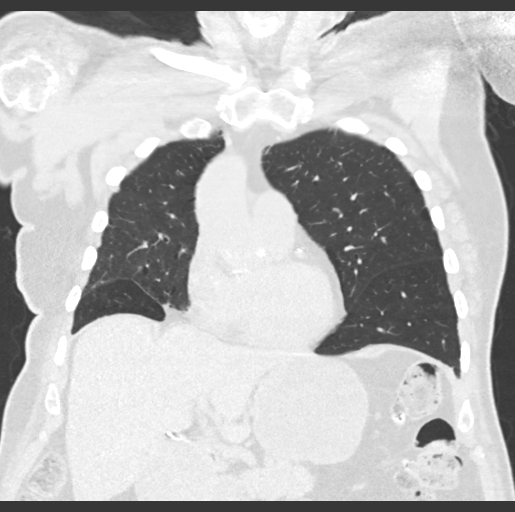
[im 97/161  lung]
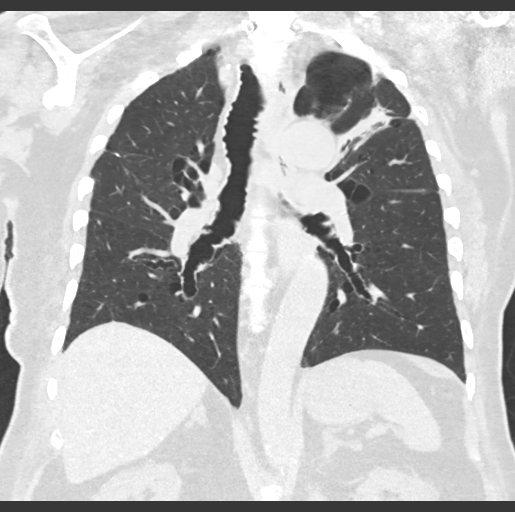

[14 of 36 positions shown; findings below may reference images not displayed]

FINDINGS: Cardiovascular: Heart size is normal. There is no significant
pericardial fluid, thickening or pericardial calcification. There is
aortic atherosclerosis, as well as atherosclerosis of the great
vessels of the mediastinum and the coronary arteries, including
calcified atherosclerotic plaque in the left main, left anterior
descending and left circumflex coronary arteries. Calcifications of
the aortic valve and inferior mitral annulus. Ectasia of the
ascending thoracic aorta (4.4 cm in diameter).

Mediastinum/Nodes: No pathologically enlarged mediastinal or hilar
lymph nodes. Please note that accurate exclusion of hilar adenopathy
is limited on noncontrast CT scans. Esophagus is unremarkable in
appearance. No axillary lymphadenopathy.

Lungs/Pleura: Again noted is a chronic mass-like area of
architectural distortion in the left upper lobe (axial image 44 of
series 4) which currently measures 6.2 x 3.0 cm, similar to prior
examinations, most compatible with chronic postradiation mass-like
fibrosis. 4 mm right lower lobe nodule (axial image 128 of series
4), stable and likely benign. No other definite suspicious appearing
pulmonary nodules or masses are noted. No acute consolidative
airspace disease. No pleural effusions. Diffuse bronchial wall
thickening with mild to moderate centrilobular and paraseptal
emphysema. Suture line in the right upper lobe related to prior
wedge resection.

Upper Abdomen: Aortic atherosclerosis. Status post cholecystectomy.
Multiple low-attenuation lesions in both kidneys, incompletely
characterized on today's non-contrast CT examination, but
statistically likely to represent cysts, measuring up to at least
4.1 cm in the interpolar region of the left kidney.

Musculoskeletal: Chronic compression fracture of L2 with post
vertebroplasty changes and 50% loss of anterior vertebral body
height, similar to prior examinations. Multiple old healed
right-sided posterior rib fractures are incidentally noted. There
are no aggressive appearing lytic or blastic lesions noted in the
visualized portions of the skeleton.
IMPRESSION: 1. Chronic postradiation mass-like fibrosis in the left upper lobe,
similar to prior examinations. No definite findings to suggest
recurrent or metastatic disease in the thorax on today's
examination.
2. Aortic atherosclerosis, in addition to left main and 2 vessel
coronary artery disease. Assessment for potential risk factor
modification, dietary therapy or pharmacologic therapy may be
warranted, if clinically indicated.
3. Ectasia of the ascending thoracic aorta (4.4 cm in diameter),
unchanged. Recommend annual imaging followup by CTA or MRA. This
recommendation follows 9030
ACCF/AHA/AATS/ACR/ASA/SCA/NIKOLASZ/CHIA/CHERRI/TRI Guidelines for the
Diagnosis and Management of Patients with Thoracic Aortic Disease.
Circulation. 9030; 121: E266-e369. Aortic aneurysm NOS
(Y2VE6-DV1.S).
4. There are calcifications of the aortic valve and mitral annulus.
Echocardiographic correlation for evaluation of potential valvular
dysfunction may be warranted if clinically indicated.
5. Diffuse bronchial wall thickening with mild to moderate
centrilobular and paraseptal emphysema.

Aortic Atherosclerosis (Y2VE6-APZ.Z) and Emphysema (Y2VE6-YHS.O).

## 2020-06-25 ENCOUNTER — Encounter (HOSPITAL_COMMUNITY): Payer: Self-pay | Admitting: Specialist

## 2020-06-25 ENCOUNTER — Other Ambulatory Visit: Payer: Self-pay

## 2020-06-25 ENCOUNTER — Ambulatory Visit (HOSPITAL_COMMUNITY): Payer: Medicare Other | Attending: Family Medicine | Admitting: Specialist

## 2020-06-25 DIAGNOSIS — R262 Difficulty in walking, not elsewhere classified: Secondary | ICD-10-CM | POA: Diagnosis not present

## 2020-06-26 NOTE — Therapy (Signed)
[place], [place], [address] Phone: [phone]   Fax:  [phone]  Occupational Therapy Evaluation  Patient Details  Name: [name] MRN: [id number] Date of Birth: 1946/02/17 Referring Provider (OT): Emeterio Reeve   Encounter Date: 06/25/2020   OT End of Session - 06/26/20 0902    Visit Number 1    Number of Visits 1    OT Start Time 4081    OT Stop Time 1430    OT Time Calculation (min) 45 min    Activity Tolerance Patient tolerated treatment well    Behavior During Therapy [name] for tasks assessed/performed           Past Medical History:  Diagnosis Date  . Arthritis   . Asthma    "hx of"  . Barrett's esophagus    EGD 03/23/2011 & EGD 2/09 bx proven  . BPH (benign prostatic hyperplasia)   . 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 Chronic abdominal pain   . Chronic constipation   . Colon polyp 03/23/2011   tubular adenoma, Dr. Gala Romney  . 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 ([place]) 07/15/2011  . CVA (cerebral infarction) 1998   right sided deficit  . Delayed gastric emptying 2018  . Diverticulosis    TCS 03/23/11 pancolonic diverticula &TCS 5/08, pancolonic diverticula  . DM (diabetes mellitus) ([place])   . Edema of lower extremity 12/21/12   bilateral   . Esotropia of left eye   . GERD (gastroesophageal reflux disease)   . Glaucoma (increased eye pressure)   . Gout   . Gout   . Hemorrhagic colitis 06/06/2012.  [name] Hemorrhoids, internal 03/23/2011   tcs by Dr. Gala Romney  . Hepatitis    esosiniphilic, tx with prednisone  . Hiatal hernia   . History of radiation therapy 05/21/18- 05/30/18   Left Lung/ 54 Gy delivered in 3 fractions of 18 Gy. SBRT  . HTN (hypertension)   . Hx of radiation therapy 1974   right base of skull area-lymphoma  . Hyperlipidemia   . Lower facial weakness    Right  . Lymphoma ([place]) 1974   XRT at [facility], right base of skull area  . Neuropathy     . Non-small cell lung cancer ([place]) dx'd 04/26/18  . Peripheral edema    R>L legs  . Rash    chronic, recurrent, R>L legs  . Renal insufficiency   . Steatohepatitis    liver biopsy 2009  . Stroke [facility]) 1998   right hemiparesis/plegia    Past Surgical History:  Procedure Laterality Date  . BIOPSY  12/01/2016   Procedure: BIOPSY;  Surgeon: Daneil Dolin, MD;  Location: [facility] ENDO SUITE;  Service: Endoscopy;;  duodenum, gastric, esophagus  . CHOLECYSTECTOMY    . COLONOSCOPY  03/23/11   Dr. Gala Romney  pancolonic diverticula, hemorrhoids, tubular adenoma.. next tcs 03/2016  . COLONOSCOPY WITH PROPOFOL N/A 12/01/2016   inadequate bowel prep precluded exam  . ESOPHAGOGASTRODUODENOSCOPY  02/05/08   goblet cell metaplasia/negative for H.pylori  . ESOPHAGOGASTRODUODENOSCOPY  03/23/11   Dr. Gala Romney, barretts, hiatal hernia  . ESOPHAGOGASTRODUODENOSCOPY (EGD) WITH PROPOFOL N/A 12/01/2016   Dr. Gala Romney: Large amount of retained gastric contents precluded completion of the stomach. Mucosal changes were found in the stomach. Erosions and somewhat scalloped appearing mucosa present, reactive gastritis/no H pylori. Barrett's esophagus noted, no dysplasia on biopsy. Duodenal biopsies taken as well, benign, no evidence of eosinophilia.  [name]  IR FLUORO GUIDED NEEDLE PLC ASPIRATION/INJECTION LOC  12/13/2018  . IR KYPHO LUMBAR INC FX REDUCE BONE BX UNI/BIL CANNULATION INC/IMAGING  12/13/2018  . IR RADIOLOGY PERIPHERAL GUIDED IV START  12/13/2018  . IR US GUIDE VASC ACCESS LEFT  12/13/2018  . MASS BIOPSY  11/01/2012   Procedure: NECK MASS BIOPSY;  Surgeon: Ascencion Dike, MD;  Location: [facility] ORS;  Service: ENT;  Laterality: Right;  Excisional Bx Right Neck Mass; attempted external jugular cutdown of left side  . PAROTIDECTOMY  11/24/2012   Procedure: PAROTIDECTOMY;  Surgeon: Ascencion Dike, MD;  Location: [place];  Service: ENT;  Laterality: N/A;  Total parotidectomy  . PLEURECTOMY    . right lymph node removal Right    behind right ear   . Right video-assisted thoracic surgery, pleurectomy, and pleurodesis  2011  . VIDEO BRONCHOSCOPY WITH ENDOBRONCHIAL NAVIGATION N/A 04/26/2018   Procedure: VIDEO BRONCHOSCOPY WITH ENDOBRONCHIAL NAVIGATION;  Surgeon: Melrose Nakayama, MD;  Location: [place];  Service: Thoracic;  Laterality: N/A;    There were no vitals filed for this visit.   Subjective Assessment - 06/25/20 2305    Subjective  I need to replace my wc    Patient is accompanied by: Family member             [facility] OT Assessment - 06/26/20 0001      Assessment   Medical Diagnosis decreased mobility    Referring Provider (OT) Emeterio Reeve      Precautions   Precautions Fall      Restrictions   Weight Bearing Restrictions No      Balance Screen   Has the patient fallen in the past 6 months Yes    How many times? 1    Has the patient had a decrease in activity level because of a fear of falling?  Yes    Is the patient reluctant to leave their home because of a fear of falling?  Yes      Home  Environment   Family/patient expects to be discharged to: Private residence                                          Plan - 06/26/20 0902    Clinical Decision Making Limited treatment options, no task modification necessary    Comorbidities Affecting Occupational Performance: Presence of comorbidities impacting occupational performance    Modification or Assistance to Complete Evaluation  No modification of tasks or assist necessary to complete eval    OT Frequency One time visit    OT Treatment/Interventions Self-care/ADL training;Patient/family education    Consulted and Agree with Plan of Care Patient;Family member/caregiver           Patient will benefit from skilled therapeutic intervention in order to improve the following deficits and impairments:           Visit Diagnosis: Difficulty in walking, not elsewhere classified    Problem List Patient Active Problem List    Diagnosis Date Noted  . GI bleed 03/18/2020  . Acute lower GI bleeding 03/03/2020  . Pressure injury of skin 03/03/2020  . Anemia   . Thoracic aortic aneurysm without rupture ([place]) 11/19/2019  . Aortic atherosclerosis ([place]) 11/19/2019  . Protein-calorie malnutrition ([place]) 06/10/2019  . Vitamin D deficiency 03/07/2019  . Hypogonadism, male 07/26/2018  . Gynecomastia, male 06/27/2018  . Malignant neoplasm of  upper lobe of left lung ([place]) 04/06/2018  . History of parotid cancer 02/07/2018  . RUQ pain 11/09/2016  . Colon adenomas 11/09/2016  . Primary hypothyroidism 09/09/2015  . Chronic constipation 05/22/2015  . Candidiasis, intertrigo 09/05/2014  . Arthritis, shoulder region 12/10/2013  . Impetigo bullosa 10/23/2013  . Intertrigo 10/12/2013  . Chronic left shoulder pain 09/25/2013  . Muscle weakness (generalized) 09/25/2013  . Hypercalcemia 08/08/2013  . CKD (chronic kidney disease), stage III 08/07/2013  . AKI (acute kidney injury) ([place]) 07/24/2013  . Adhesive capsulitis of left shoulder 05/14/2013  . Steatohepatitis   . Diverticulosis   . GERD (gastroesophageal reflux disease)   . History of CVA (cerebrovascular accident)   . Hiatal hernia   . HLD (hyperlipidemia)   . Glaucoma (increased eye pressure)   . Esotropia of left eye   . BPH (benign prostatic hyperplasia)   . Hx of radiation therapy   . 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 Epidermal inclusion cyst 10/05/2012  . Neck pain 08/26/2012  . Chronic abdominal pain 08/26/2012  . Leg edema 08/24/2012  . Hemiplegia of dominant side as late effect following cerebrovascular disease ([place]) 06/06/2012  . Diarrhea 04/17/2012  . Obesity 04/17/2012  . Diabetic neuropathy ([place]) 02/13/2012  . Recurrent boils 01/12/2012  . 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 ([place]) 07/15/2011  . Hemorrhoids, internal 03/23/2011  . Hepatitis 03/02/2011  . EOSINOPHILIA 12/23/2009  . Type 2 diabetes mellitus with  stage 2 chronic kidney disease, with long-term current use of insulin ([place]) 01/05/2009  . Gout, unspecified 01/05/2009  . HTN (hypertension) 01/05/2009  . BARRETTS ESOPHAGUS 01/05/2009  . DIVERTICULOSIS OF COLON 01/05/2009  . History of lymphoma 01/05/2009    Vangie Bicker, [place], OTR/L [phone]  06/26/2020, 9:03 AM  [place], [place], [address] Phone: [phone]   Fax:  [phone]  Name: [name] MRN: [id number] Date of Birth: [birth year]

## 2020-06-29 ENCOUNTER — Other Ambulatory Visit: Payer: Self-pay | Admitting: *Deleted

## 2020-06-29 NOTE — Telephone Encounter (Signed)
Received call from patient.   Requested refill on Oxycodone/APAP.   Ok to refill??  Last office visit 06/10/2020.  Last refill 04/14/2020.

## 2020-06-30 MED ORDER — OXYCODONE-ACETAMINOPHEN 7.5-325 MG PO TABS: 1.0000 | ORAL_TABLET | ORAL | 0 refills | Status: DC | PRN

## 2020-07-06 ENCOUNTER — Other Ambulatory Visit: Payer: Self-pay | Admitting: Family Medicine

## 2020-07-21 ENCOUNTER — Ambulatory Visit: Payer: Medicare Other | Admitting: Family Medicine

## 2020-07-24 ENCOUNTER — Other Ambulatory Visit: Payer: Self-pay

## 2020-07-24 ENCOUNTER — Encounter: Payer: Self-pay | Admitting: Family Medicine

## 2020-07-24 ENCOUNTER — Ambulatory Visit (INDEPENDENT_AMBULATORY_CARE_PROVIDER_SITE_OTHER): Payer: Medicare Other | Admitting: Family Medicine

## 2020-07-24 VITALS — BP 128/70 | HR 64 | Temp 98.2°F | Resp 18

## 2020-07-24 DIAGNOSIS — C07 Malignant neoplasm of parotid gland: Secondary | ICD-10-CM

## 2020-07-24 DIAGNOSIS — S34112D 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, subsequent encounter: Secondary | ICD-10-CM

## 2020-07-24 DIAGNOSIS — I69959 Hemiplegia and hemiparesis following unspecified cerebrovascular disease affecting unspecified side: Secondary | ICD-10-CM | POA: Diagnosis not present

## 2020-07-24 DIAGNOSIS — I10 Essential (primary) hypertension: Secondary | ICD-10-CM

## 2020-07-24 DIAGNOSIS — L89152 Pressure ulcer of sacral region, stage 2: Secondary | ICD-10-CM | POA: Diagnosis not present

## 2020-07-24 DIAGNOSIS — R590 Localized enlarged lymph nodes: Secondary | ICD-10-CM | POA: Diagnosis not present

## 2020-07-24 MED ORDER — CLONIDINE HCL 0.2 MG PO TABS: 0.2000 mg | ORAL_TABLET | Freq: Two times a day (BID) | ORAL | 2 refills | Status: DC

## 2020-07-24 NOTE — Patient Instructions (Addendum)
Reduce clonidine to twice a day Continue all other medications Home Health Referral  Physical therapy  CT of neck  F/U 4 months

## 2020-07-24 NOTE — Assessment & Plan Note (Signed)
He does have a small lymph node in the area of his previous parotid gland adenocarcinoma he also has a good amount of scar tissue from his previous radiation therapy.  Will obtain CT of neck with contrast to further evaluate.

## 2020-07-24 NOTE — Progress Notes (Signed)
Subjective:    Patient ID: [name], male    DOB: 01-Dec-1946, 74 y.o.   MRN: [id number]  Patient presents for Rash (on bottom) and Hypertension  Pt here  patient here with his wife.  He has recurrent decubitus ulceration.  He has pain right in the center of his gluteal cleft there is also erythema and irritated skin surrounding.  At times they have noted some blood when he wipes.  His wife states that they are now 2 open areas.  There is been no change in his bowel movements.   HTN- he checks his blood pressure three times a week, he has assistant with his insurance where he uploads his blood pressures.  They have been running low over the past few weeks.  He will get systolics sometimes less than 924 and his diastolic will be in the 26S to 60s.  He is on multiple medications for his blood pressure.  He does occasionally feel dizzy or little woozy headed when his blood pressure is low.  His wife states that she often rechecks it a few hours later and it does come up some.  He has not had any falls with his transfers.  No chest pain or shortness of breath.    Soreness on right side of neck, feels a smalll knot for the past few weeks he does have history of parotid gland adenocarcinoma.  His last imaging was done in 2019    Review Of Systems:  GEN- denies fatigue, fever, weight loss,weakness, recent illness HEENT- denies eye drainage, change in vision, nasal discharge, CVS- denies chest pain, palpitations RESP- denies SOB, cough, wheeze ABD- denies N/V, change in stools, abd pain GU- denies dysuria, hematuria, dribbling, incontinence MSK- + joint pain, muscle aches, injury Neuro- denies headache, dizziness, syncope, seizure activity       Objective:    BP 128/70 (BP Location: Left Arm, Patient Position: Sitting, Cuff Size: Normal)   Pulse 64   Temp 98.2 F (36.8 C) (Temporal)   Resp 18   SpO2 99%  GEN- NAD, alert and oriented x3, sitting in wheelchair HEENT- PERRL, EOMI,  non injected sclera, pink conjunctiva, MMM, oropharynx clear Neck- Supple, no thyromegaly, small Right submanibular node, scarring right neck,parotid region with mild tenderness  CVS- RRR, no murmur RESP-CTAB ABD-NABS,soft,NT, ND Skin- erythema with moisture maceration gluteal cleft,  TTP over sacrum, stage 2 ulceration x 2  in gluteal cleft approx2-3 inches long with macerated skin surrounding, erythema  EXT- chronic non pitting  edema Pulses- Radial 2+        Assessment & Plan:      HTN- reduce clonidine to BID, continue all other meds  Problem List Items Addressed This Visit      Unprioritized   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 [facility])   Relevant Orders   Ambulatory referral to [place] of dominant side as late effect following cerebrovascular disease ([place]) (Chronic)    Physical therapy to be set up at home.  Wife is noted that he is also been dragging his left foot some she is concerned about increased falls when he does try to transfer.      Relevant Orders   Ambulatory referral to [place]   Parotid gland adenocarcinoma [facility])    He does have a small lymph node in the area of his previous parotid gland adenocarcinoma he also has a good amount of scar tissue from his previous radiation therapy.  Will obtain CT of neck with contrast to further evaluate.       Other Visit Diagnoses    Decubitus ulcer of sacral region, stage 2 ([place])    -  Primary   DuoDERM applied.  Wife can also use Desitin for irritation, HH nursing needed, unfortnately he sits all day   there is no sign of cellulitis at this time    Relevant Orders   Ambulatory referral to Home Health      Note: This dictation was prepared with Dragon dictation along with smaller phrase technology. Any transcriptional errors that result from this process are unintentional.

## 2020-07-24 NOTE — Assessment & Plan Note (Signed)
Reduce clonidine to twice a day  Continue all other meds Keep monitoring BP

## 2020-07-24 NOTE — Assessment & Plan Note (Signed)
Physical therapy to be set up at home.  Wife is noted that he is also been dragging his left foot some she is concerned about increased falls when he does try to transfer.

## 2020-07-26 DIAGNOSIS — Z993 Dependence on wheelchair: Secondary | ICD-10-CM | POA: Diagnosis not present

## 2020-07-26 DIAGNOSIS — Z79891 Long term (current) use of opiate analgesic: Secondary | ICD-10-CM | POA: Diagnosis not present

## 2020-07-26 DIAGNOSIS — C07 Malignant neoplasm of parotid gland: Secondary | ICD-10-CM | POA: Diagnosis not present

## 2020-07-26 DIAGNOSIS — L89152 Pressure ulcer of sacral region, stage 2: Secondary | ICD-10-CM | POA: Diagnosis not present

## 2020-07-26 DIAGNOSIS — I10 Essential (primary) hypertension: Secondary | ICD-10-CM | POA: Diagnosis not present

## 2020-07-26 DIAGNOSIS — I69351 Hemiplegia and hemiparesis following cerebral infarction affecting right dominant side: Secondary | ICD-10-CM | POA: Diagnosis not present

## 2020-07-26 DIAGNOSIS — R591 Generalized enlarged lymph nodes: Secondary | ICD-10-CM | POA: Diagnosis not present

## 2020-07-26 DIAGNOSIS — S34112D 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, subsequent encounter: Secondary | ICD-10-CM | POA: Diagnosis not present

## 2020-07-26 DIAGNOSIS — Z794 Long term (current) use of insulin: Secondary | ICD-10-CM | POA: Diagnosis not present

## 2020-07-28 DIAGNOSIS — S34112D 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, subsequent encounter: Secondary | ICD-10-CM | POA: Diagnosis not present

## 2020-07-28 DIAGNOSIS — I69351 Hemiplegia and hemiparesis following cerebral infarction affecting right dominant side: Secondary | ICD-10-CM | POA: Diagnosis not present

## 2020-07-28 DIAGNOSIS — Z993 Dependence on wheelchair: Secondary | ICD-10-CM | POA: Diagnosis not present

## 2020-07-28 DIAGNOSIS — Z79891 Long term (current) use of opiate analgesic: Secondary | ICD-10-CM | POA: Diagnosis not present

## 2020-07-28 DIAGNOSIS — C07 Malignant neoplasm of parotid gland: Secondary | ICD-10-CM | POA: Diagnosis not present

## 2020-07-28 DIAGNOSIS — R591 Generalized enlarged lymph nodes: Secondary | ICD-10-CM | POA: Diagnosis not present

## 2020-07-28 DIAGNOSIS — I10 Essential (primary) hypertension: Secondary | ICD-10-CM | POA: Diagnosis not present

## 2020-07-28 DIAGNOSIS — L89152 Pressure ulcer of sacral region, stage 2: Secondary | ICD-10-CM | POA: Diagnosis not present

## 2020-07-28 DIAGNOSIS — Z794 Long term (current) use of insulin: Secondary | ICD-10-CM | POA: Diagnosis not present

## 2020-07-30 ENCOUNTER — Telehealth: Payer: Self-pay | Admitting: *Deleted

## 2020-07-30 NOTE — Telephone Encounter (Signed)
Received call from [place], [facility] SN with [place] [phone]~ telephone.   Requested VO to extend [facility] SN services 3x weekly x1 week, then 2x weekly x3 weeks for wound care with DuoDerm to sacral ulcer.   VO given.

## 2020-07-31 ENCOUNTER — Telehealth: Payer: Self-pay | Admitting: *Deleted

## 2020-07-31 DIAGNOSIS — Z993 Dependence on wheelchair: Secondary | ICD-10-CM | POA: Diagnosis not present

## 2020-07-31 DIAGNOSIS — S34112D 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, subsequent encounter: Secondary | ICD-10-CM | POA: Diagnosis not present

## 2020-07-31 DIAGNOSIS — Z794 Long term (current) use of insulin: Secondary | ICD-10-CM | POA: Diagnosis not present

## 2020-07-31 DIAGNOSIS — Z79891 Long term (current) use of opiate analgesic: Secondary | ICD-10-CM | POA: Diagnosis not present

## 2020-07-31 DIAGNOSIS — R591 Generalized enlarged lymph nodes: Secondary | ICD-10-CM | POA: Diagnosis not present

## 2020-07-31 DIAGNOSIS — I10 Essential (primary) hypertension: Secondary | ICD-10-CM | POA: Diagnosis not present

## 2020-07-31 DIAGNOSIS — L89152 Pressure ulcer of sacral region, stage 2: Secondary | ICD-10-CM | POA: Diagnosis not present

## 2020-07-31 DIAGNOSIS — I69351 Hemiplegia and hemiparesis following cerebral infarction affecting right dominant side: Secondary | ICD-10-CM | POA: Diagnosis not present

## 2020-07-31 DIAGNOSIS — C07 Malignant neoplasm of parotid gland: Secondary | ICD-10-CM | POA: Diagnosis not present

## 2020-07-31 NOTE — Telephone Encounter (Signed)
Agree with below. Addendum:    I believe that she has limits with moving and including, toileting, bathing, feeding, dressing and grooming. I believe the power wheelchair is needed for pt to be able to perform ADL's in her home

## 2020-07-31 NOTE — Telephone Encounter (Signed)
Received call from [place], [facility] PT with [place] [phone]~ telephone.   Requested VO for HH PT 1x1, 2x4 to work on transfers.   VO given.

## 2020-07-31 NOTE — Telephone Encounter (Signed)
Received call from patient wife [name].   Reports that patient has reached donut hole and LInzess is now unaffordable ($400/ month).   Inquired if there is alternative patient can try.   MD please advise.

## 2020-08-03 NOTE — Telephone Encounter (Signed)
See if we have samples, unfortunately most of the meds will be expensive since he is in the donut hole  You can send Amitiza 37mg 1 tab BID but likely will also be very costly

## 2020-08-03 NOTE — Telephone Encounter (Signed)
Agree with verbal orders

## 2020-08-04 DIAGNOSIS — Z79891 Long term (current) use of opiate analgesic: Secondary | ICD-10-CM | POA: Diagnosis not present

## 2020-08-04 DIAGNOSIS — R591 Generalized enlarged lymph nodes: Secondary | ICD-10-CM | POA: Diagnosis not present

## 2020-08-04 DIAGNOSIS — Z993 Dependence on wheelchair: Secondary | ICD-10-CM | POA: Diagnosis not present

## 2020-08-04 DIAGNOSIS — I10 Essential (primary) hypertension: Secondary | ICD-10-CM | POA: Diagnosis not present

## 2020-08-04 DIAGNOSIS — S34112D 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, subsequent encounter: Secondary | ICD-10-CM | POA: Diagnosis not present

## 2020-08-04 DIAGNOSIS — I69351 Hemiplegia and hemiparesis following cerebral infarction affecting right dominant side: Secondary | ICD-10-CM | POA: Diagnosis not present

## 2020-08-04 DIAGNOSIS — L89152 Pressure ulcer of sacral region, stage 2: Secondary | ICD-10-CM | POA: Diagnosis not present

## 2020-08-04 DIAGNOSIS — C07 Malignant neoplasm of parotid gland: Secondary | ICD-10-CM | POA: Diagnosis not present

## 2020-08-04 DIAGNOSIS — Z794 Long term (current) use of insulin: Secondary | ICD-10-CM | POA: Diagnosis not present

## 2020-08-04 MED ORDER — LUBIPROSTONE 24 MCG PO CAPS
24.0000 ug | ORAL_CAPSULE | Freq: Two times a day (BID) | ORAL | 1 refills | Status: DC
Start: 2020-08-04 — End: 2020-12-14

## 2020-08-04 NOTE — Telephone Encounter (Signed)
Call placed to patient and patient wife made aware.   Agreeable to changing to Amitiza. Prescription sent to pharmacy.

## 2020-08-06 DIAGNOSIS — I69351 Hemiplegia and hemiparesis following cerebral infarction affecting right dominant side: Secondary | ICD-10-CM | POA: Diagnosis not present

## 2020-08-06 DIAGNOSIS — Z794 Long term (current) use of insulin: Secondary | ICD-10-CM | POA: Diagnosis not present

## 2020-08-06 DIAGNOSIS — I10 Essential (primary) hypertension: Secondary | ICD-10-CM | POA: Diagnosis not present

## 2020-08-06 DIAGNOSIS — C07 Malignant neoplasm of parotid gland: Secondary | ICD-10-CM | POA: Diagnosis not present

## 2020-08-06 DIAGNOSIS — Z993 Dependence on wheelchair: Secondary | ICD-10-CM | POA: Diagnosis not present

## 2020-08-06 DIAGNOSIS — Z79891 Long term (current) use of opiate analgesic: Secondary | ICD-10-CM | POA: Diagnosis not present

## 2020-08-06 DIAGNOSIS — L89152 Pressure ulcer of sacral region, stage 2: Secondary | ICD-10-CM | POA: Diagnosis not present

## 2020-08-06 DIAGNOSIS — R591 Generalized enlarged lymph nodes: Secondary | ICD-10-CM | POA: Diagnosis not present

## 2020-08-06 DIAGNOSIS — S34112D 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, subsequent encounter: Secondary | ICD-10-CM | POA: Diagnosis not present

## 2020-08-07 DIAGNOSIS — R591 Generalized enlarged lymph nodes: Secondary | ICD-10-CM | POA: Diagnosis not present

## 2020-08-07 DIAGNOSIS — Z993 Dependence on wheelchair: Secondary | ICD-10-CM | POA: Diagnosis not present

## 2020-08-07 DIAGNOSIS — C07 Malignant neoplasm of parotid gland: Secondary | ICD-10-CM | POA: Diagnosis not present

## 2020-08-07 DIAGNOSIS — L89152 Pressure ulcer of sacral region, stage 2: Secondary | ICD-10-CM | POA: Diagnosis not present

## 2020-08-07 DIAGNOSIS — S34112D 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, subsequent encounter: Secondary | ICD-10-CM | POA: Diagnosis not present

## 2020-08-07 DIAGNOSIS — I10 Essential (primary) hypertension: Secondary | ICD-10-CM | POA: Diagnosis not present

## 2020-08-07 DIAGNOSIS — I69351 Hemiplegia and hemiparesis following cerebral infarction affecting right dominant side: Secondary | ICD-10-CM | POA: Diagnosis not present

## 2020-08-07 DIAGNOSIS — Z794 Long term (current) use of insulin: Secondary | ICD-10-CM | POA: Diagnosis not present

## 2020-08-07 DIAGNOSIS — Z79891 Long term (current) use of opiate analgesic: Secondary | ICD-10-CM | POA: Diagnosis not present

## 2020-08-11 DIAGNOSIS — S34112D 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, subsequent encounter: Secondary | ICD-10-CM | POA: Diagnosis not present

## 2020-08-11 DIAGNOSIS — Z993 Dependence on wheelchair: Secondary | ICD-10-CM | POA: Diagnosis not present

## 2020-08-11 DIAGNOSIS — Z79891 Long term (current) use of opiate analgesic: Secondary | ICD-10-CM | POA: Diagnosis not present

## 2020-08-11 DIAGNOSIS — L89152 Pressure ulcer of sacral region, stage 2: Secondary | ICD-10-CM | POA: Diagnosis not present

## 2020-08-11 DIAGNOSIS — R591 Generalized enlarged lymph nodes: Secondary | ICD-10-CM | POA: Diagnosis not present

## 2020-08-11 DIAGNOSIS — Z794 Long term (current) use of insulin: Secondary | ICD-10-CM | POA: Diagnosis not present

## 2020-08-11 DIAGNOSIS — C07 Malignant neoplasm of parotid gland: Secondary | ICD-10-CM | POA: Diagnosis not present

## 2020-08-11 DIAGNOSIS — I10 Essential (primary) hypertension: Secondary | ICD-10-CM | POA: Diagnosis not present

## 2020-08-11 DIAGNOSIS — I69351 Hemiplegia and hemiparesis following cerebral infarction affecting right dominant side: Secondary | ICD-10-CM | POA: Diagnosis not present

## 2020-08-11 LAB — PHOSPHORUS: Phosphorus: 3 mg/dL (ref 2.1–4.3)

## 2020-08-11 LAB — MAGNESIUM: Magnesium: 1.4 mg/dL — ABNORMAL LOW (ref 1.5–2.5)

## 2020-08-11 LAB — PTH, INTACT AND CALCIUM
Calcium: 10.2 mg/dL (ref 8.6–10.3)
PTH: 162 pg/mL — ABNORMAL HIGH (ref 14–64)

## 2020-08-13 ENCOUNTER — Telehealth: Payer: Self-pay | Admitting: *Deleted

## 2020-08-13 NOTE — Telephone Encounter (Signed)
Received copy of letter from insurance.   States that special power wheelchair with power tile, power recline and seat lift options has been denied.   Letter routed to provider for review.

## 2020-08-13 NOTE — Telephone Encounter (Signed)
Received call from patient wife. [name].   Reports that she received letter with denial in regards to power wheelchair.   Requested patient wife bring in copy of letter to review.

## 2020-08-14 ENCOUNTER — Other Ambulatory Visit: Payer: Self-pay

## 2020-08-14 ENCOUNTER — Telehealth: Payer: Self-pay

## 2020-08-14 ENCOUNTER — Encounter (HOSPITAL_COMMUNITY): Payer: Self-pay

## 2020-08-14 DIAGNOSIS — C07 Malignant neoplasm of parotid gland: Secondary | ICD-10-CM | POA: Diagnosis not present

## 2020-08-14 DIAGNOSIS — I69351 Hemiplegia and hemiparesis following cerebral infarction affecting right dominant side: Secondary | ICD-10-CM | POA: Diagnosis not present

## 2020-08-14 DIAGNOSIS — Z993 Dependence on wheelchair: Secondary | ICD-10-CM | POA: Diagnosis not present

## 2020-08-14 DIAGNOSIS — Z794 Long term (current) use of insulin: Secondary | ICD-10-CM | POA: Diagnosis not present

## 2020-08-14 DIAGNOSIS — R2241 Localized swelling, mass and lump, right lower limb: Secondary | ICD-10-CM | POA: Insufficient documentation

## 2020-08-14 DIAGNOSIS — Z5321 Procedure and treatment not carried out due to patient leaving prior to being seen by health care provider: Secondary | ICD-10-CM | POA: Diagnosis not present

## 2020-08-14 DIAGNOSIS — I10 Essential (primary) hypertension: Secondary | ICD-10-CM | POA: Diagnosis not present

## 2020-08-14 DIAGNOSIS — Z79891 Long term (current) use of opiate analgesic: Secondary | ICD-10-CM | POA: Diagnosis not present

## 2020-08-14 DIAGNOSIS — L89152 Pressure ulcer of sacral region, stage 2: Secondary | ICD-10-CM | POA: Diagnosis not present

## 2020-08-14 DIAGNOSIS — S34112D 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, subsequent encounter: Secondary | ICD-10-CM | POA: Diagnosis not present

## 2020-08-14 DIAGNOSIS — R591 Generalized enlarged lymph nodes: Secondary | ICD-10-CM | POA: Diagnosis not present

## 2020-08-14 NOTE — Telephone Encounter (Signed)
It is unclear why his power wheelchair was denied.  All the appropriate paperwork including my notations and physical therapies notations but a specific wheelchair that he needs was provided to the insurance company.  We will fax this information again.

## 2020-08-14 NOTE — Telephone Encounter (Signed)
Call placed to patient. No answer. No VM.

## 2020-08-14 NOTE — Telephone Encounter (Signed)
Spoke with [place] and she stated that the pt is having swelling and redness in leg and it hurts to move his foot. I advised HH to sent pt to ER to possibly get an [place] to rule out DVT. She agreed with plan.

## 2020-08-14 NOTE — ED Triage Notes (Signed)
Pt to er, pt states that he has a hx of a stroke, pt has R sided deficits.  Pt states that he is here today for some swelling in his R leg.  Wife states that he normally has some swelling, but it normally comes and goes, but it isn't normally this bad.

## 2020-08-14 NOTE — Telephone Encounter (Signed)
See below, letter written

## 2020-08-15 ENCOUNTER — Emergency Department (HOSPITAL_COMMUNITY)
Admission: EM | Admit: 2020-08-15 | Discharge: 2020-08-15 | Disposition: A | Discharge status: Home / Self Care | Payer: Medicare Other | Attending: Emergency Medicine | Admitting: Emergency Medicine

## 2020-08-18 ENCOUNTER — Telehealth (INDEPENDENT_AMBULATORY_CARE_PROVIDER_SITE_OTHER): Payer: Medicare Other | Admitting: Nurse Practitioner

## 2020-08-18 ENCOUNTER — Telehealth: Payer: Self-pay | Admitting: *Deleted

## 2020-08-18 ENCOUNTER — Encounter: Payer: Self-pay | Admitting: Nurse Practitioner

## 2020-08-18 ENCOUNTER — Other Ambulatory Visit: Payer: Self-pay

## 2020-08-18 DIAGNOSIS — S34112D 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, subsequent encounter: Secondary | ICD-10-CM | POA: Diagnosis not present

## 2020-08-18 DIAGNOSIS — I10 Essential (primary) hypertension: Secondary | ICD-10-CM | POA: Diagnosis not present

## 2020-08-18 DIAGNOSIS — E039 Hypothyroidism, unspecified: Secondary | ICD-10-CM

## 2020-08-18 DIAGNOSIS — Z993 Dependence on wheelchair: Secondary | ICD-10-CM | POA: Diagnosis not present

## 2020-08-18 DIAGNOSIS — N1832 Chronic kidney disease, stage 3b: Secondary | ICD-10-CM

## 2020-08-18 DIAGNOSIS — E782 Mixed hyperlipidemia: Secondary | ICD-10-CM

## 2020-08-18 DIAGNOSIS — E559 Vitamin D deficiency, unspecified: Secondary | ICD-10-CM

## 2020-08-18 DIAGNOSIS — Z794 Long term (current) use of insulin: Secondary | ICD-10-CM | POA: Diagnosis not present

## 2020-08-18 DIAGNOSIS — C07 Malignant neoplasm of parotid gland: Secondary | ICD-10-CM | POA: Diagnosis not present

## 2020-08-18 DIAGNOSIS — E1121 Type 2 diabetes mellitus with diabetic nephropathy: Secondary | ICD-10-CM

## 2020-08-18 DIAGNOSIS — L89152 Pressure ulcer of sacral region, stage 2: Secondary | ICD-10-CM | POA: Diagnosis not present

## 2020-08-18 DIAGNOSIS — I69351 Hemiplegia and hemiparesis following cerebral infarction affecting right dominant side: Secondary | ICD-10-CM | POA: Diagnosis not present

## 2020-08-18 DIAGNOSIS — R591 Generalized enlarged lymph nodes: Secondary | ICD-10-CM | POA: Diagnosis not present

## 2020-08-18 DIAGNOSIS — Z79891 Long term (current) use of opiate analgesic: Secondary | ICD-10-CM | POA: Diagnosis not present

## 2020-08-18 NOTE — Telephone Encounter (Signed)
Received call from Janett Billow [facility] SN with [place]. [phone]~ telephone.   Reports that patient family contact triage on 08/14/2020 for R calf swelling and pain. Reports that they were advised to go to ER for possible DVT.   States that patient did go to ER, but left after >10 hours without being seen.   Reports that leg is decreased in swelling and pain has resolved today.   MD to be made aware.

## 2020-08-18 NOTE — Telephone Encounter (Signed)
Appeal faxed.

## 2020-08-18 NOTE — Progress Notes (Signed)
08/18/2020  Endocrinology follow-up note  TELEHEALTH VISIT: The patient is being engaged in telehealth visit due to COVID-19.  This type of visit limits physical examination significantly, and thus is not preferable over face-to-face encounters.  I connected with  [name] on 08/18/20 by a video enabled telemedicine application and verified that I am speaking with the correct person using two identifiers.   I discussed the limitations of evaluation and management by telemedicine. The patient expressed understanding and agreed to proceed.    The participants involved in this visit include: Brita Romp, NP located at [facility] and [name] and his wife, Neilan Rizzo, located at their personal residence listed.   Subjective:    Patient ID: [name], male    DOB: Oct 31, 1946,    Past Medical History:  Diagnosis Date  . Arthritis   . Asthma    "hx of"  . Barrett's esophagus    EGD 03/23/2011 & EGD 2/09 bx proven  . BPH (benign prostatic hyperplasia)   . Cancer of parotid gland ([place]) 11/23/12   Adenocarcinoma  . Chronic abdominal pain   . Chronic constipation   . Colon polyp 03/23/2011   tubular adenoma, Dr. Gala Romney  . 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 ([place]) 07/15/2011  . CVA (cerebral infarction) 1998   right sided deficit  . Delayed gastric emptying 2018  . Diverticulosis    TCS 03/23/11 pancolonic diverticula &TCS 5/08, pancolonic diverticula  . DM (diabetes mellitus) ([place])   . Edema of lower extremity 12/21/12   bilateral   . Esotropia of left eye   . GERD (gastroesophageal reflux disease)   . Glaucoma (increased eye pressure)   . Gout   . Gout   . Hemorrhagic colitis 06/06/2012.  [name] Hemorrhoids, internal 03/23/2011   tcs by Dr. Gala Romney  . Hepatitis    esosiniphilic, tx with prednisone  . Hiatal hernia   . History of radiation therapy 05/21/18- 05/30/18   Left Lung/ 54 Gy delivered in 3 fractions of 18 Gy. SBRT   . HTN (hypertension)   . Hx of radiation therapy 1974   right base of skull area-lymphoma  . Hyperlipidemia   . Lower facial weakness    Right  . Lymphoma ([place]) 1974   [facility] at [facility], right base of skull area  . Neuropathy   . Non-small cell lung cancer ([place]) dx'd 04/26/18  . Peripheral edema    R>L legs  . Rash    chronic, recurrent, R>L legs  . Renal insufficiency   . Steatohepatitis    liver biopsy 2009  . Stroke [facility]) 1998   right hemiparesis/plegia   Past Surgical History:  Procedure Laterality Date  . BIOPSY  12/01/2016   Procedure: BIOPSY;  Surgeon: Daneil Dolin, MD;  Location: [facility] ENDO SUITE;  Service: Endoscopy;;  duodenum, gastric, esophagus  . CHOLECYSTECTOMY    . COLONOSCOPY  03/23/11   Dr. Gala Romney  pancolonic diverticula, hemorrhoids, tubular adenoma.. next tcs 03/2016  . COLONOSCOPY WITH PROPOFOL N/A 12/01/2016   inadequate bowel prep precluded exam  . ESOPHAGOGASTRODUODENOSCOPY  02/05/08   goblet cell metaplasia/negative for H.pylori  . ESOPHAGOGASTRODUODENOSCOPY  03/23/11   Dr. Gala Romney, barretts, hiatal hernia  . ESOPHAGOGASTRODUODENOSCOPY (EGD) WITH PROPOFOL N/A 12/01/2016   Dr. Gala Romney: Large amount of retained gastric contents precluded completion of the stomach. Mucosal changes were found in the stomach. Erosions and somewhat scalloped appearing mucosa present, reactive gastritis/no H pylori. Barrett's esophagus  noted, no dysplasia on biopsy. Duodenal biopsies taken as well, benign, no evidence of eosinophilia.  . IR FLUORO GUIDED NEEDLE PLC ASPIRATION/INJECTION LOC  12/13/2018  . IR KYPHO LUMBAR INC FX REDUCE BONE BX UNI/BIL CANNULATION INC/IMAGING  12/13/2018  . IR RADIOLOGY PERIPHERAL GUIDED IV START  12/13/2018  . IR US GUIDE VASC ACCESS LEFT  12/13/2018  . MASS BIOPSY  11/01/2012   Procedure: NECK MASS BIOPSY;  Surgeon: Ascencion Dike, MD;  Location: [facility] ORS;  Service: ENT;  Laterality: Right;  Excisional Bx Right Neck Mass; attempted external jugular cutdown of left side  .  PAROTIDECTOMY  11/24/2012   Procedure: PAROTIDECTOMY;  Surgeon: Ascencion Dike, MD;  Location: [place];  Service: ENT;  Laterality: N/A;  Total parotidectomy  . PLEURECTOMY    . right lymph node removal Right    behind right ear  . Right video-assisted thoracic surgery, pleurectomy, and pleurodesis  2011  . VIDEO BRONCHOSCOPY WITH ENDOBRONCHIAL NAVIGATION N/A 04/26/2018   Procedure: VIDEO BRONCHOSCOPY WITH ENDOBRONCHIAL NAVIGATION;  Surgeon: Melrose Nakayama, MD;  Location: [place];  Service: Thoracic;  Laterality: N/A;   Social History   Socioeconomic History  . Marital status: Married    Spouse name: Not on file  . Number of children: 1  . Years of education: Not on file  . Highest education level: Not on file  Occupational History  . Occupation: disabled  Tobacco Use  . Smoking status: Former Smoker    Packs/day: 3.00    Years: 25.00    Pack years: 75.00    Types: Cigarettes    Quit date: 03/01/1997    Years since quitting: 23.4  . Smokeless tobacco: Never Used  Vaping Use  . Vaping Use: Never used  Substance and Sexual Activity  . Alcohol use: No  . Drug use: No  . Sexual activity: Not Currently  Other Topics Concern  . Not on file  Social History Narrative  . Not on file   Social Determinants of Health   Financial Resource Strain: Low Risk   . Difficulty of Paying Living Expenses: Not very hard  Food Insecurity:   . Worried About Charity fundraiser in the Last Year: Not on file  . Ran Out of Food in the Last Year: Not on file  Transportation Needs:   . Lack of Transportation (Medical): Not on file  . Lack of Transportation (Non-Medical): Not on file  Physical Activity:   . Days of Exercise per Week: Not on file  . Minutes of Exercise per Session: Not on file  Stress:   . Feeling of Stress : Not on file  Social Connections:   . Frequency of Communication with Friends and Family: Not on file  . Frequency of Social Gatherings with Friends and Family: Not on file   . Attends Religious Services: Not on file  . Active Member of Clubs or Organizations: Not on file  . Attends Archivist Meetings: Not on file  . Marital Status: Not on file   Outpatient Encounter Medications as of 08/18/2020  Medication Sig  . allopurinol (ZYLOPRIM) 300 MG tablet TAKE 1 TABLET(300 MG) BY MOUTH DAILY  . amLODipine (NORVASC) 10 MG tablet TAKE 1 TABLET(10 MG) BY MOUTH DAILY (Patient taking differently: Take 10 mg by mouth daily. TAKE 1 TABLET(10 MG) BY MOUTH DAILY)  . cloNIDine (CATAPRES) 0.2 MG tablet Take 1 tablet (0.2 mg total) by mouth 2 (two) times daily.  . diclofenac sodium (VOLTAREN) 1 %  GEL Apply twice a day for affected area on ankle as needed (Patient taking differently: Apply 4 g topically 2 (two) times daily as needed. Apply twice a day for affected area on ankle as needed)  . furosemide (LASIX) 40 MG tablet Take 1 tablet (40 mg total) by mouth daily.  [name] gabapentin (NEURONTIN) 300 MG capsule TAKE 2 CAPSULES BY MOUTH TWICE DAILY, MAY TAKE AN ADDTIONAL 2 CAPSULES MIDDAY AS NEEDED FOR PAIN  . insulin glargine (LANTUS SOLOSTAR) 100 UNIT/ML Solostar Pen Inject 42 Units into the skin at bedtime.  . iron polysaccharides (NIFEREX) 150 MG capsule Take 1 capsule (150 mg total) by mouth daily.  . Lancets (ONETOUCH DELICA PLUS [id number]) MISC USE TO TEST BLOOD SUGAR FOUR TIMES DAILY (Patient taking differently: 1 Device by Other route in the morning, at noon, in the evening, and at bedtime. )  . lisinopril (ZESTRIL) 20 MG tablet Take 1 tablet (20 mg total) by mouth daily.  [name] lubiprostone (AMITIZA) 24 MCG capsule Take 1 capsule (24 mcg total) by mouth 2 (two) times daily with a meal.  . metoprolol succinate (TOPROL-XL) 50 MG 24 hr tablet TAKE 1.5 TABLETs BY MOUTH EVERY DAY WITH OR IMMEDIATELY FOLLOWING A MEAL  . nystatin (MYCOSTATIN/NYSTOP) powder Apply topically 3 (three) times daily.  [name] VERIO test strip USE TO CHECK BLOOD SUGAR FOUR TIMES DAILY (Patient taking  differently: 1 each by Other route in the morning, at noon, in the evening, and at bedtime. )  . oxyCODONE-acetaminophen (PERCOCET) 7.5-325 MG tablet Take 1 tablet by mouth every 4 (four) hours as needed.  . pantoprazole (PROTONIX) 40 MG tablet Take 1 tablet (40 mg total) by mouth daily.  . simvastatin (ZOCOR) 20 MG tablet Take 1 tablet (20 mg total) by mouth at bedtime.  . tamsulosin (FLOMAX) 0.4 MG CAPS capsule Take 1 capsule (0.4 mg total) by mouth daily.  [name] tiZANidine (ZANAFLEX) 2 MG tablet TAKE 1 TABLET(2 MG) BY MOUTH TWICE DAILY AS NEEDED  . Triamcinolone Acetonide (TRIAMCINOLONE 0.1 % CREAM : EUCERIN) CREA Apply 1 application topically 2 (two) times daily as needed. (Patient taking differently: Apply 1 application topically 2 (two) times daily as needed for rash. )  . [DISCONTINUED] cephALEXin (KEFLEX) 500 MG capsule Take 1 capsule (500 mg total) by mouth 2 (two) times daily.  . [DISCONTINUED] mupirocin ointment (BACTROBAN) 2 % Apply 1 application topically 2 (two) times daily. TO AFFECTED ARES ON BUTTOCKS   No facility-administered encounter medications on file as of 08/18/2020.   ALLERGIES: No Known Allergies VACCINATION STATUS: Immunization History  Administered Date(s) Administered  . Fluad Quad(high Dose 65+) 09/06/2019  . Influenza Split 08/24/2012  . Influenza, High Dose Seasonal PF 10/31/2017, 10/02/2018  . Influenza,inj,Quad PF,6+ Mos 10/09/2013, 09/05/2014, 11/23/2015, 08/16/2016  . Moderna SARS-COVID-2 Vaccination 01/19/2020, 02/19/2020  . Pneumococcal Conjugate-13 01/07/2014  . Pneumococcal Polysaccharide-23 11/25/2012  . Tdap 09/18/2019    Diabetes He presents for his follow-up diabetic visit. He has type 2 diabetes mellitus. Onset time: diagnosed approx at age 27. His disease course has been stable. There are no hypoglycemic associated symptoms. Pertinent negatives for hypoglycemia include no confusion, headaches, nervousness/anxiousness, pallor or tremors. Associated  symptoms include weakness. Pertinent negatives for diabetes include no chest pain, no fatigue, no polydipsia, no polyphagia, no polyuria and no weight loss. There are no hypoglycemic complications. Symptoms are stable. Diabetic complications include a CVA and nephropathy. Risk factors for coronary artery disease include diabetes mellitus, dyslipidemia, hypertension, male sex, obesity, sedentary lifestyle and tobacco  exposure. Current diabetic treatment includes insulin injections. He is compliant with treatment most of the time. His weight is fluctuating minimally. He is following a generally healthy diet. When asked about meal planning, he reported none. He has had a previous visit with a dietitian. He never participates in exercise. His home blood glucose trend is fluctuating minimally. His breakfast blood glucose range is generally 90-110 mg/dl. His bedtime blood glucose range is generally 140-180 mg/dl. (His wife, the patients primary caregiver, presents today for virtual visit with logs showing stable glycemic profile.  She reports his AM blood glucose readings are usually between 90-110 and PM glucose readings are generally less than 150.  She denies any episodes of hypoglycemia.  A1C previously was 7.4%, not checked prior to this visit.) An ACE inhibitor/angiotensin II receptor blocker is being taken. He does not see a podiatrist.Eye exam is current.  Hyperlipidemia This is a chronic problem. The current episode started more than 1 year ago. The problem is controlled. Recent lipid tests were reviewed and are normal. Exacerbating diseases include chronic renal disease, diabetes, hypothyroidism and obesity. Factors aggravating his hyperlipidemia include fatty foods. Pertinent negatives include no chest pain or myalgias. Current antihyperlipidemic treatment includes statins. The current treatment provides moderate improvement of lipids. Compliance problems include adherence to exercise.  Risk factors for  coronary artery disease include diabetes mellitus, dyslipidemia, hypertension, male sex, a sedentary lifestyle and obesity.    Review of systems: Limited as above. Review of Systems  Constitutional: Positive for malaise/fatigue. Negative for fatigue and weight loss.  Cardiovascular: Positive for leg swelling. Negative for chest pain and palpitations.  Gastrointestinal: Negative for constipation and diarrhea.  Musculoskeletal: Negative for myalgias.       Essentially WC bound due to disequilibrium   Skin: Negative for pallor.  Neurological: Positive for weakness. Negative for tremors and headaches.  Endo/Heme/Allergies: Negative for polydipsia and polyphagia.  Psychiatric/Behavioral: Negative for confusion and depression. The patient is not nervous/anxious and does not have insomnia.      Objective:     There were no vitals filed for this visit.  Wt Readings from Last 3 Encounters:  08/14/20 250 lb (113.4 kg)  03/04/20 248 lb 3.8 oz (112.6 kg)  09/06/19 250 lb (113.4 kg)   BP Readings from Last 3 Encounters:  08/14/20 (!) 177/103  07/24/20 128/70  06/10/20 (!) 148/82   Physical Exam- Telehealth- significantly limited due to nature of visit  Constitutional: There is no height or weight on file to calculate BMI. , not in acute distress, normal state of mind Respiratory: Adequate breathing efforts   CMP  CMP Latest Ref Rng & Units 08/10/2020 06/10/2020 05/12/2020  Glucose 65 - 99 mg/dL - 122(H) 112(H)  BUN 7 - 25 mg/dL - 26(H) 28(H)  Creatinine 0.70 - 1.18 mg/dL - 1.77(H) 1.86(H)  Sodium 135 - 146 mmol/L - 140 140  Potassium 3.5 - 5.3 mmol/L - 4.3 4.6  Chloride 98 - 110 mmol/L - 104 104  CO2 20 - 32 mmol/L - 22 27  Calcium 8.6 - 10.3 mg/dL 10.2 11.1(H) 11.2(H)  Total Protein 6.1 - 8.1 g/dL - - 7.7  Total Bilirubin 0.2 - 1.2 mg/dL - - 0.2  Alkaline Phos 38 - 126 U/L - - -  AST 10 - 35 U/L - - 11  ALT 9 - 46 U/L - - 8(L)    Diabetic Labs (most recent): Lab Results   Component Value Date   HGBA1C 7.4 (H) 03/03/2020   HGBA1C 6.1 (  H) 10/08/2019   HGBA1C 6.3 (H) 06/06/2019   Lipid Panel     Component Value Date/Time   CHOL 138 05/12/2020 0948   TRIG 153 (H) 05/12/2020 0948   HDL 50 05/12/2020 0948   CHOLHDL 2.8 05/12/2020 0948   VLDL 66 (H) 05/04/2017 0931   LDLCALC 64 05/12/2020 0948     Assessment & Plan:   1. Type 2 diabetes mellitus with stage 2 chronic kidney disease and CVA  His wife, the patients primary caregiver, presents today for virtual visit with logs showing stable glycemic profile.  She reports his AM blood glucose readings are usually between 90-110 and PM glucose readings are generally less than 150.  She denies any episodes of hypoglycemia.  A1C previously was 7.4%, not checked prior to this visit.  -  His diabetes is complicated by recurrent CVA, stage 3 renal insufficiency, and he remains at extremely high risk for more acute and chronic complications of diabetes which include CAD, CVA, CKD, retinopathy, and neuropathy. -In May 2019, he was diagnosed with non-small cell lung cancer, status post multiple rounds of radiation treatment completed on May 30, 2018.    -Given his stable glycemic profile, he is advised to continue same dose of Lantus 42 units SQ nightly. -He is encouraged to monitor blood glucose readings twice daily, before breakfast and before bed and report findings less than 70 or greater than 200 for 3 tests in a row.  -Patient is not a candidate for SGLT2 inhibitors due to CKD, nor full dose Metformin, not a suitable candidate for incretin therapy.  2) Hypertension: His blood pressure readings are generally well controlled according to previous visit readings and at home measurements.  He is advised to continue Metoprolol 75 mg po daily, lisinopril 20 mg po daily, lasix 40 mg po daily, Clonidine 0.2 mg po twice daily, and Norvasc 10 mg po daily.  As far as leg swelling goes, he is advised to keep legs elevated  and use compression stockings to help manage the swelling.  Advised to also continue working with PT for mobilization.  3) Lipids/HPL:  -His most recent lipid panel from 05/12/20 shows controlled LDL at 64 and triglycerides of 145.  He is advised to continue Simvastatin 20 mg po daily at bedtime.  Side effects and precautions discussed with him.  4) Hypercalcemia- -review of his medical records indicate high PTH associated with high calcium.  His calcium level on 08/10/20 was 10.2, improving. Due to CKD, suspect tertiary hyperparathyroidism.  At this time, I advised to discontinue vitamin D supplements.  Will be considered for Sensipar therapy if calcium is above 11.5 on next measurement.  May benefit from referral to nephrology in the future.  5) Hypothyroidism: -His most recent thyroid function tests on 05/12/20 show appropriate replacement.  He is advised to continue Levothyroxine 88 mcg po daily before breakfast.   - We discussed about the correct intake of his thyroid hormone, on empty stomach at fasting, with water, separated by at least 30 minutes from breakfast and other medications,  and separated by more than 4 hours from calcium, iron, multivitamins, acid reflux medications (PPIs). -Patient is made aware of the fact that thyroid hormone replacement is needed for life, dose to be adjusted by periodic monitoring of thyroid function tests.  6) Vitamin D deficiency -His vitamin D level on 05/12/20 was 59 after replenishment treatment.  He is advised to avoid any additional vitamin D supplementation at this time.  - I advised patient  to maintain close follow up with Dr. Buelah Manis for primary care needs.   I spent 30 minutes dedicated to the care of this patient on the date of this encounter to include pre-visit review of records, face-to-face time with the patient, and post visit ordering of  Testing.    Please refer to Patient Instructions for Blood Glucose Monitoring and Insulin/Medications  Dosing Guide"  in media tab for additional information. Please  also refer to " Patient Self Inventory" in the Media  tab for reviewed elements of pertinent patient history.  [name] and his wife [name] participated in the discussions, expressed understanding, and voiced agreement with the above plans.  All questions were answered to his satisfaction. he is encouraged to contact clinic should he have any questions or concerns prior to his return visit.   Follow up plan: Return in about 4 months (around 12/18/2020) for Diabetes follow up, Thyroid follow up, Previsit labs.  Rayetta Pigg, FNP-BC [facility] Phone: [phone] Fax: [phone]  08/18/2020, 11:48 AM

## 2020-08-18 NOTE — Telephone Encounter (Signed)
noted 

## 2020-08-18 NOTE — Patient Instructions (Signed)

## 2020-08-19 ENCOUNTER — Other Ambulatory Visit: Payer: Self-pay

## 2020-08-19 ENCOUNTER — Ambulatory Visit (HOSPITAL_COMMUNITY)
Admission: RE | Admit: 2020-08-19 | Discharge: 2020-08-19 | Disposition: A | Discharge status: Home / Self Care | Payer: Medicare Other | Source: Ambulatory Visit | Attending: Family Medicine | Admitting: Family Medicine

## 2020-08-19 DIAGNOSIS — M4040 Postural lordosis, site unspecified: Secondary | ICD-10-CM | POA: Diagnosis not present

## 2020-08-19 DIAGNOSIS — R590 Localized enlarged lymph nodes: Secondary | ICD-10-CM | POA: Insufficient documentation

## 2020-08-19 DIAGNOSIS — L989 Disorder of the skin and subcutaneous tissue, unspecified: Secondary | ICD-10-CM | POA: Diagnosis not present

## 2020-08-19 DIAGNOSIS — K118 Other diseases of salivary glands: Secondary | ICD-10-CM | POA: Diagnosis not present

## 2020-08-19 DIAGNOSIS — C07 Malignant neoplasm of parotid gland: Secondary | ICD-10-CM | POA: Insufficient documentation

## 2020-08-19 DIAGNOSIS — I6529 Occlusion and stenosis of unspecified carotid artery: Secondary | ICD-10-CM | POA: Diagnosis not present

## 2020-08-19 LAB — POCT I-STAT CREATININE: Creatinine, Ser: 1.7 mg/dL — ABNORMAL HIGH (ref 0.61–1.24)

## 2020-08-19 MED ORDER — IOHEXOL 300 MG/ML  SOLN
75.0000 mL | Freq: Once | INTRAMUSCULAR | Status: AC | PRN
  Administered 2020-08-19: 60 mL via INTRAVENOUS

## 2020-08-20 ENCOUNTER — Other Ambulatory Visit: Payer: Self-pay | Admitting: *Deleted

## 2020-08-20 DIAGNOSIS — I6521 Occlusion and stenosis of right carotid artery: Secondary | ICD-10-CM

## 2020-08-20 DIAGNOSIS — I10 Essential (primary) hypertension: Secondary | ICD-10-CM | POA: Diagnosis not present

## 2020-08-20 DIAGNOSIS — C07 Malignant neoplasm of parotid gland: Secondary | ICD-10-CM | POA: Diagnosis not present

## 2020-08-20 DIAGNOSIS — I69351 Hemiplegia and hemiparesis following cerebral infarction affecting right dominant side: Secondary | ICD-10-CM | POA: Diagnosis not present

## 2020-08-20 DIAGNOSIS — Z79891 Long term (current) use of opiate analgesic: Secondary | ICD-10-CM | POA: Diagnosis not present

## 2020-08-20 DIAGNOSIS — Z794 Long term (current) use of insulin: Secondary | ICD-10-CM | POA: Diagnosis not present

## 2020-08-20 DIAGNOSIS — L89152 Pressure ulcer of sacral region, stage 2: Secondary | ICD-10-CM | POA: Diagnosis not present

## 2020-08-20 DIAGNOSIS — R591 Generalized enlarged lymph nodes: Secondary | ICD-10-CM | POA: Diagnosis not present

## 2020-08-20 DIAGNOSIS — Z993 Dependence on wheelchair: Secondary | ICD-10-CM | POA: Diagnosis not present

## 2020-08-20 DIAGNOSIS — S34112D 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, subsequent encounter: Secondary | ICD-10-CM | POA: Diagnosis not present

## 2020-08-21 ENCOUNTER — Telehealth: Payer: Self-pay | Admitting: *Deleted

## 2020-08-21 DIAGNOSIS — S34112D 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, subsequent encounter: Secondary | ICD-10-CM | POA: Diagnosis not present

## 2020-08-21 DIAGNOSIS — I10 Essential (primary) hypertension: Secondary | ICD-10-CM | POA: Diagnosis not present

## 2020-08-21 DIAGNOSIS — Z79891 Long term (current) use of opiate analgesic: Secondary | ICD-10-CM | POA: Diagnosis not present

## 2020-08-21 DIAGNOSIS — L89152 Pressure ulcer of sacral region, stage 2: Secondary | ICD-10-CM | POA: Diagnosis not present

## 2020-08-21 DIAGNOSIS — Z993 Dependence on wheelchair: Secondary | ICD-10-CM | POA: Diagnosis not present

## 2020-08-21 DIAGNOSIS — I69351 Hemiplegia and hemiparesis following cerebral infarction affecting right dominant side: Secondary | ICD-10-CM | POA: Diagnosis not present

## 2020-08-21 DIAGNOSIS — Z794 Long term (current) use of insulin: Secondary | ICD-10-CM | POA: Diagnosis not present

## 2020-08-21 DIAGNOSIS — R591 Generalized enlarged lymph nodes: Secondary | ICD-10-CM | POA: Diagnosis not present

## 2020-08-21 DIAGNOSIS — C07 Malignant neoplasm of parotid gland: Secondary | ICD-10-CM | POA: Diagnosis not present

## 2020-08-21 NOTE — Telephone Encounter (Signed)
Received call from Janett Billow [facility] SN with [place]. [phone]~ telephone.   Reports that wound to sacrum has healed. States that [facility] SN was to discharge next week. States that since patient is having increased edema to leg, they would like to extend services 1x weekly x2 weeks.   VO given.

## 2020-08-21 NOTE — Telephone Encounter (Signed)
Noted  

## 2020-08-22 ENCOUNTER — Other Ambulatory Visit: Payer: Self-pay | Admitting: Family Medicine

## 2020-08-22 DIAGNOSIS — C07 Malignant neoplasm of parotid gland: Secondary | ICD-10-CM | POA: Diagnosis not present

## 2020-08-22 DIAGNOSIS — I69351 Hemiplegia and hemiparesis following cerebral infarction affecting right dominant side: Secondary | ICD-10-CM | POA: Diagnosis not present

## 2020-08-22 DIAGNOSIS — Z794 Long term (current) use of insulin: Secondary | ICD-10-CM | POA: Diagnosis not present

## 2020-08-22 DIAGNOSIS — Z993 Dependence on wheelchair: Secondary | ICD-10-CM | POA: Diagnosis not present

## 2020-08-22 DIAGNOSIS — I10 Essential (primary) hypertension: Secondary | ICD-10-CM | POA: Diagnosis not present

## 2020-08-22 DIAGNOSIS — L89152 Pressure ulcer of sacral region, stage 2: Secondary | ICD-10-CM | POA: Diagnosis not present

## 2020-08-22 DIAGNOSIS — S34112D 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, subsequent encounter: Secondary | ICD-10-CM | POA: Diagnosis not present

## 2020-08-22 DIAGNOSIS — R591 Generalized enlarged lymph nodes: Secondary | ICD-10-CM | POA: Diagnosis not present

## 2020-08-22 DIAGNOSIS — Z79891 Long term (current) use of opiate analgesic: Secondary | ICD-10-CM | POA: Diagnosis not present

## 2020-08-25 ENCOUNTER — Telehealth: Payer: Self-pay | Admitting: *Deleted

## 2020-08-25 ENCOUNTER — Other Ambulatory Visit: Payer: Self-pay | Admitting: Family Medicine

## 2020-08-25 DIAGNOSIS — Z79891 Long term (current) use of opiate analgesic: Secondary | ICD-10-CM | POA: Diagnosis not present

## 2020-08-25 DIAGNOSIS — I10 Essential (primary) hypertension: Secondary | ICD-10-CM | POA: Diagnosis not present

## 2020-08-25 DIAGNOSIS — R591 Generalized enlarged lymph nodes: Secondary | ICD-10-CM | POA: Diagnosis not present

## 2020-08-25 DIAGNOSIS — Z794 Long term (current) use of insulin: Secondary | ICD-10-CM | POA: Diagnosis not present

## 2020-08-25 DIAGNOSIS — I69351 Hemiplegia and hemiparesis following cerebral infarction affecting right dominant side: Secondary | ICD-10-CM | POA: Diagnosis not present

## 2020-08-25 DIAGNOSIS — L89152 Pressure ulcer of sacral region, stage 2: Secondary | ICD-10-CM | POA: Diagnosis not present

## 2020-08-25 DIAGNOSIS — Z993 Dependence on wheelchair: Secondary | ICD-10-CM | POA: Diagnosis not present

## 2020-08-25 DIAGNOSIS — C07 Malignant neoplasm of parotid gland: Secondary | ICD-10-CM | POA: Diagnosis not present

## 2020-08-25 DIAGNOSIS — S34112D 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, subsequent encounter: Secondary | ICD-10-CM | POA: Diagnosis not present

## 2020-08-25 NOTE — Telephone Encounter (Signed)
BP after lunch noted at 108/65, HR 64

## 2020-08-25 NOTE — Telephone Encounter (Signed)
Received call from Anne Ng [facility] SN with [place] [phone]~ telephone.   Reports that patient BP noted at 88/60 today. States that patient is asymptomatic. No dizziness, nausea reported.   Cal placed to patient wife. Verified that patient is not voicing any concerns. Verified medications as follows: Amlodipine 238m Furosemide 440mClonidine 0.38m90mID Metoprolol 73m45mAdvised to increase fluids and monitor for Sx. Call back in PM with readings after eating/ pushing fluids.

## 2020-08-25 NOTE — Telephone Encounter (Signed)
Reduce clonidine 0.34m to 1 tablet daily

## 2020-08-25 NOTE — Telephone Encounter (Signed)
Call placed to patient and patient wife [name] made aware.

## 2020-08-27 DIAGNOSIS — L89152 Pressure ulcer of sacral region, stage 2: Secondary | ICD-10-CM | POA: Diagnosis not present

## 2020-08-27 DIAGNOSIS — Z993 Dependence on wheelchair: Secondary | ICD-10-CM | POA: Diagnosis not present

## 2020-08-27 DIAGNOSIS — R591 Generalized enlarged lymph nodes: Secondary | ICD-10-CM | POA: Diagnosis not present

## 2020-08-27 DIAGNOSIS — I10 Essential (primary) hypertension: Secondary | ICD-10-CM | POA: Diagnosis not present

## 2020-08-27 DIAGNOSIS — Z794 Long term (current) use of insulin: Secondary | ICD-10-CM | POA: Diagnosis not present

## 2020-08-27 DIAGNOSIS — C07 Malignant neoplasm of parotid gland: Secondary | ICD-10-CM | POA: Diagnosis not present

## 2020-08-27 DIAGNOSIS — Z79891 Long term (current) use of opiate analgesic: Secondary | ICD-10-CM | POA: Diagnosis not present

## 2020-08-27 DIAGNOSIS — S34112D 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, subsequent encounter: Secondary | ICD-10-CM | POA: Diagnosis not present

## 2020-08-27 DIAGNOSIS — I69351 Hemiplegia and hemiparesis following cerebral infarction affecting right dominant side: Secondary | ICD-10-CM | POA: Diagnosis not present

## 2020-08-28 DIAGNOSIS — Z993 Dependence on wheelchair: Secondary | ICD-10-CM | POA: Diagnosis not present

## 2020-08-28 DIAGNOSIS — I69351 Hemiplegia and hemiparesis following cerebral infarction affecting right dominant side: Secondary | ICD-10-CM | POA: Diagnosis not present

## 2020-08-28 DIAGNOSIS — Z794 Long term (current) use of insulin: Secondary | ICD-10-CM | POA: Diagnosis not present

## 2020-08-28 DIAGNOSIS — S34112D 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, subsequent encounter: Secondary | ICD-10-CM | POA: Diagnosis not present

## 2020-08-28 DIAGNOSIS — L89152 Pressure ulcer of sacral region, stage 2: Secondary | ICD-10-CM | POA: Diagnosis not present

## 2020-08-28 DIAGNOSIS — Z79891 Long term (current) use of opiate analgesic: Secondary | ICD-10-CM | POA: Diagnosis not present

## 2020-08-28 DIAGNOSIS — C07 Malignant neoplasm of parotid gland: Secondary | ICD-10-CM | POA: Diagnosis not present

## 2020-08-28 DIAGNOSIS — R591 Generalized enlarged lymph nodes: Secondary | ICD-10-CM | POA: Diagnosis not present

## 2020-08-28 DIAGNOSIS — I10 Essential (primary) hypertension: Secondary | ICD-10-CM | POA: Diagnosis not present

## 2020-09-01 ENCOUNTER — Ambulatory Visit: Payer: Self-pay | Admitting: Family Medicine

## 2020-09-01 ENCOUNTER — Telehealth: Payer: Self-pay | Admitting: *Deleted

## 2020-09-01 DIAGNOSIS — Z794 Long term (current) use of insulin: Secondary | ICD-10-CM | POA: Diagnosis not present

## 2020-09-01 DIAGNOSIS — R0789 Other chest pain: Secondary | ICD-10-CM | POA: Diagnosis not present

## 2020-09-01 DIAGNOSIS — R109 Unspecified abdominal pain: Secondary | ICD-10-CM | POA: Diagnosis not present

## 2020-09-01 DIAGNOSIS — S34112D 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, subsequent encounter: Secondary | ICD-10-CM | POA: Diagnosis not present

## 2020-09-01 DIAGNOSIS — C07 Malignant neoplasm of parotid gland: Secondary | ICD-10-CM | POA: Diagnosis not present

## 2020-09-01 DIAGNOSIS — Z79891 Long term (current) use of opiate analgesic: Secondary | ICD-10-CM | POA: Diagnosis not present

## 2020-09-01 DIAGNOSIS — I499 Cardiac arrhythmia, unspecified: Secondary | ICD-10-CM | POA: Diagnosis not present

## 2020-09-01 DIAGNOSIS — L89152 Pressure ulcer of sacral region, stage 2: Secondary | ICD-10-CM | POA: Diagnosis not present

## 2020-09-01 DIAGNOSIS — I10 Essential (primary) hypertension: Secondary | ICD-10-CM | POA: Diagnosis not present

## 2020-09-01 DIAGNOSIS — Z743 Need for continuous supervision: Secondary | ICD-10-CM | POA: Diagnosis not present

## 2020-09-01 DIAGNOSIS — Z993 Dependence on wheelchair: Secondary | ICD-10-CM | POA: Diagnosis not present

## 2020-09-01 DIAGNOSIS — R591 Generalized enlarged lymph nodes: Secondary | ICD-10-CM | POA: Diagnosis not present

## 2020-09-01 DIAGNOSIS — I69351 Hemiplegia and hemiparesis following cerebral infarction affecting right dominant side: Secondary | ICD-10-CM | POA: Diagnosis not present

## 2020-09-01 DIAGNOSIS — R079 Chest pain, unspecified: Secondary | ICD-10-CM | POA: Diagnosis not present

## 2020-09-01 NOTE — Telephone Encounter (Signed)
Received call from [facility] SN.   Reports that upon arrival, BP noted at 220/140. States that he had not taken AM medications. Took medications as follows: Amlodipine 77m Furosemide 426mClonidine 0.54m854mMetoprolol 40m43mReports that patient voiced C/O R sided pain, diarrhea, and was notably sweating.   EMS contacted and evaluated patient. Reports that EKG WNL, BP 173/122. Advised to contact PCP as wait at ER is >10hrs.   [facility]   Of note, [facility] was to D/C services today. Requested order to extend X2 weeks. VO given.

## 2020-09-01 NOTE — Telephone Encounter (Signed)
Call pt wife back see what BP is now, that he has had his medications   See if he able to eat and drink  If having any chest pain, SOB, he needs to go to the ER  He has chronic RUQ pain, if this is the same as usual he can wait until tomorrow

## 2020-09-01 NOTE — Telephone Encounter (Signed)
Noted, dont change anything else tonight

## 2020-09-01 NOTE — Telephone Encounter (Signed)
Call placed to patient wife [name]. Reports that BP at 3:45pm noted at 147/98, HR 74. States that patient appears to be at baseline. No reports of chest pain, SOB, no difficulty eating or drinking.

## 2020-09-02 ENCOUNTER — Other Ambulatory Visit: Payer: Self-pay

## 2020-09-02 ENCOUNTER — Encounter: Payer: Self-pay | Admitting: Family Medicine

## 2020-09-02 ENCOUNTER — Ambulatory Visit (INDEPENDENT_AMBULATORY_CARE_PROVIDER_SITE_OTHER): Payer: Medicare Other | Admitting: Family Medicine

## 2020-09-02 ENCOUNTER — Telehealth: Payer: Self-pay | Admitting: Family Medicine

## 2020-09-02 VITALS — BP 136/82 | HR 84 | Temp 97.7°F | Resp 18

## 2020-09-02 DIAGNOSIS — I700 Atherosclerosis of aorta: Secondary | ICD-10-CM | POA: Diagnosis not present

## 2020-09-02 DIAGNOSIS — Z794 Long term (current) use of insulin: Secondary | ICD-10-CM | POA: Diagnosis not present

## 2020-09-02 DIAGNOSIS — Z23 Encounter for immunization: Secondary | ICD-10-CM | POA: Diagnosis not present

## 2020-09-02 DIAGNOSIS — N182 Chronic kidney disease, stage 2 (mild): Secondary | ICD-10-CM

## 2020-09-02 DIAGNOSIS — I10 Essential (primary) hypertension: Secondary | ICD-10-CM | POA: Diagnosis not present

## 2020-09-02 DIAGNOSIS — E1122 Type 2 diabetes mellitus with diabetic chronic kidney disease: Secondary | ICD-10-CM

## 2020-09-02 NOTE — Progress Notes (Signed)
  Chronic Care Management   Note  09/02/2020 Name: [name] MRN: [id number] DOB: February 02, 1946  [name] is a 74 y.o. year old male who is a primary care patient of [place], [name], MD. I reached out to [name] by phone today in response to a referral sent by [name]'s PCP, Buelah Manis, [name], MD.   [name] was given information about Chronic Care Management services today including:  1. CCM service includes personalized support from designated clinical staff supervised by his physician, including individualized plan of care and coordination with other care providers 2. 24/7 contact phone numbers for assistance for urgent and routine care needs. 3. Service will only be billed when office clinical staff spend 20 minutes or more in a month to coordinate care. 4. Only one practitioner may furnish and bill the service in a calendar month. 5. The patient may stop CCM services at any time (effective at the end of the month) by phone call to the office staff.   Patient agreed to services and verbal consent obtained.   Follow up plan:   Carley Perdue [employer] Scheduler

## 2020-09-02 NOTE — Progress Notes (Signed)
Subjective:    Patient ID: [name], male    DOB: April 09, 1946, 74 y.o.   MRN: [id number]  Patient presents for HTN Patient here with elevated blood pressure.  Noted few weeks ago he got a call from his wife as well as home health agency that he was having low blood pressures.  I reduce his clonidine to 0.2 mg once a day and continue his other medications and blood pressure normalized.  Yesterday however he was not feeling well.  Wife felt like his face was swollen above the eye he had 3 loose bowel movements back to back and he felt weak.  EMS was called as there was concern for stroke.  His blood pressure was significantly high with a systolic in the 956O.  He had not had his medication at that time as it was so early in the morning.  EMS did evaluate him they placed an EKG states that they not see anything urgent and advised him that there was over 10-hour wait at the emergency room so they did not go.  He denies any chest pain or shortness of breath.  He does have chronic right upper quadrant pain which he complained to EMS about.  Since then he has not had any further loose bowels.  His wife does state his appetite has been reduced for the past few weeks he also does not eat very much in the evening time.  He does eat breakfast quite well.  He feels better today but still feels a little weak.  His blood pressure was back down after his medications systolic was in the 130Q.   No fever or other signs of infection.  The decubitus ulcer is pretty much resolved and home health will be signing off.   Review Of Systems:  GEN- denies fatigue, fever, weight loss,+weakness, recent illness HEENT- denies eye drainage, change in vision, nasal discharge, CVS- denies chest pain, palpitations RESP- denies SOB, cough, wheeze ABD- denies N/V, +change in stools, abd pain GU- denies dysuria, hematuria, dribbling, incontinence MSK- denies joint pain, muscle aches, injury Neuro- denies headache, dizziness,  syncope, seizure activity       Objective:    BP 136/82   Pulse 84   Temp 97.7 F (36.5 C) (Temporal)   Resp 18   SpO2 97%  GEN- NAD, alert and oriented x3,wheelchair bound  HEENT- PERRL, EOMI, non injected sclera, pink conjunctiva, MMM, oropharynx clear Neck- Supple, no thyromegaly, small Right submanibular node, scarring right neck,CVS- RRR, no murmur RESP-CTAB ABD-NABS,soft, mild palpation right upper quadrant, ND, no CVA tenderness NEURO- no new focal deficits, Right hemiplegia, decreased Tone   EXT- chronic non pitting  edema Pulses- Radial 2+   EKG- NSR, flat T waves     Assessment & Plan:      Problem List Items Addressed This Visit      Unprioritized   Aortic atherosclerosis (HCC)   Relevant Orders   Comprehensive metabolic panel   CBC with Differential/Platelet   HTN (hypertension) - Primary    Pressure is back to his baseline today.  Unclear he just was not feeling well baby had a gastroenteritis that was short-lived as he has a loose stools.  Because of his stroke and being wheelchair-bound his blood pressure just may have rebounded fairly quickly because of abdominal discomfort.  His EKG does not show any significant changes.  I will check metabolic CBC again today we will also check A1c as this was not available through his  endocrinologist from a couple weeks ago.  I am not can make any other changes to the medications.  His wife will continue to monitor his blood pressure as well as his blood sugar.  They will call for any changes.  We did discuss red flags if he starts having chest pain shortness of breath or has recurrent diarrhea that they cannot control that he should be evaluated in the emergency room because of his complexities.      Relevant Orders   EKG 12-Lead (Completed)   Comprehensive metabolic panel   CBC with Differential/Platelet   Type 2 diabetes mellitus with stage 2 chronic kidney disease, with long-term current use of insulin ([place])    Relevant Orders   Hemoglobin A1c      Note: This dictation was prepared with Dragon dictation along with smaller phrase technology. Any transcriptional errors that result from this process are unintentional.

## 2020-09-02 NOTE — Assessment & Plan Note (Signed)
Pressure is back to his baseline today.  Unclear he just was not feeling well baby had a gastroenteritis that was short-lived as he has a loose stools.  Because of his stroke and being wheelchair-bound his blood pressure just may have rebounded fairly quickly because of abdominal discomfort.  His EKG does not show any significant changes.  I will check metabolic CBC again today we will also check A1c as this was not available through his endocrinologist from a couple weeks ago.  I am not can make any other changes to the medications.  His wife will continue to monitor his blood pressure as well as his blood sugar.  They will call for any changes.  We did discuss red flags if he starts having chest pain shortness of breath or has recurrent diarrhea that they cannot control that he should be evaluated in the emergency room because of his complexities.

## 2020-09-02 NOTE — Patient Instructions (Addendum)
F/U as previous  Keep check on blood pressure

## 2020-09-03 ENCOUNTER — Ambulatory Visit: Payer: Medicare Other | Admitting: Pharmacist

## 2020-09-03 ENCOUNTER — Telehealth: Payer: Self-pay

## 2020-09-03 ENCOUNTER — Other Ambulatory Visit: Payer: Self-pay | Admitting: *Deleted

## 2020-09-03 DIAGNOSIS — N182 Chronic kidney disease, stage 2 (mild): Secondary | ICD-10-CM

## 2020-09-03 DIAGNOSIS — I10 Essential (primary) hypertension: Secondary | ICD-10-CM

## 2020-09-03 LAB — CBC WITH DIFFERENTIAL/PLATELET
Absolute Monocytes: 451 cells/uL (ref 200–950)
Basophils Absolute: 51 cells/uL (ref 0–200)
Basophils Relative: 0.6 %
Eosinophils Absolute: 400 cells/uL (ref 15–500)
Eosinophils Relative: 4.7 %
HCT: 40.5 % (ref 38.5–50.0)
Hemoglobin: 12 g/dL — ABNORMAL LOW (ref 13.2–17.1)
Lymphs Abs: 791 cells/uL — ABNORMAL LOW (ref 850–3900)
MCH: 21.7 pg — ABNORMAL LOW (ref 27.0–33.0)
MCHC: 29.6 g/dL — ABNORMAL LOW (ref 32.0–36.0)
MCV: 73.1 fL — ABNORMAL LOW (ref 80.0–100.0)
Monocytes Relative: 5.3 %
Neutro Abs: 6809 cells/uL (ref 1500–7800)
Neutrophils Relative %: 80.1 %
Platelets: 362 10*3/uL (ref 140–400)
RBC: 5.54 10*6/uL (ref 4.20–5.80)
RDW: 24.4 % — ABNORMAL HIGH (ref 11.0–15.0)
Total Lymphocyte: 9.3 %
WBC: 8.5 10*3/uL (ref 3.8–10.8)

## 2020-09-03 LAB — COMPREHENSIVE METABOLIC PANEL
AG Ratio: 1.3 (calc) (ref 1.0–2.5)
ALT: 6 U/L — ABNORMAL LOW (ref 9–46)
AST: 10 U/L (ref 10–35)
Albumin: 3.8 g/dL (ref 3.6–5.1)
Alkaline phosphatase (APISO): 80 U/L (ref 35–144)
BUN/Creatinine Ratio: 9 (calc) (ref 6–22)
BUN: 15 mg/dL (ref 7–25)
CO2: 27 mmol/L (ref 20–32)
Calcium: 10.4 mg/dL — ABNORMAL HIGH (ref 8.6–10.3)
Chloride: 106 mmol/L (ref 98–110)
Creat: 1.58 mg/dL — ABNORMAL HIGH (ref 0.70–1.18)
Globulin: 2.9 g/dL (calc) (ref 1.9–3.7)
Glucose, Bld: 96 mg/dL (ref 65–99)
Potassium: 4.3 mmol/L (ref 3.5–5.3)
Sodium: 143 mmol/L (ref 135–146)
Total Bilirubin: 0.4 mg/dL (ref 0.2–1.2)
Total Protein: 6.7 g/dL (ref 6.1–8.1)

## 2020-09-03 LAB — CBC MORPHOLOGY

## 2020-09-03 LAB — HEMOGLOBIN A1C
Hgb A1c MFr Bld: 6.2 % of total Hgb — ABNORMAL HIGH (ref <5.7)
Mean Plasma Glucose: 131 (calc)
eAG (mmol/L): 7.3 (calc)

## 2020-09-03 MED ORDER — FUROSEMIDE 40 MG PO TABS
40.0000 mg | ORAL_TABLET | Freq: Every day | ORAL | 2 refills | Status: DC
Start: 2020-09-03 — End: 2020-11-24

## 2020-09-03 NOTE — Chronic Care Management (AMB) (Signed)
Chronic Care Management   Follow Up Note   09/03/2020 Name: [name] MRN: [id number] DOB: May 20, 1946  Referred by: Jason Rossetti, MD Reason for referral : Chronic Care Management (PharmD follow up)   [name] is a 74 y.o. year old male who is a primary care patient of [name], [name], MD. The CCM team was consulted for assistance with chronic disease management and care coordination needs.    Review of patient status, including review of consultants reports, relevant laboratory and other test results, and collaboration with appropriate care team members and the patient's provider was performed as part of comprehensive patient evaluation and provision of chronic care management services.    SDOH (Social Determinants of Health) assessments performed: No See Care Plan activities for detailed interventions related to [facility])     Outpatient Encounter Medications as of 09/03/2020  Medication Sig Note  . allopurinol (ZYLOPRIM) 300 MG tablet TAKE 1 TABLET(300 MG) BY MOUTH DAILY   . amLODipine (NORVASC) 10 MG tablet TAKE 1 TABLET(10 MG) BY MOUTH DAILY (Patient taking differently: Take 10 mg by mouth daily. TAKE 1 TABLET(10 MG) BY MOUTH DAILY)   . cloNIDine (CATAPRES) 0.2 MG tablet Take 1 tablet (0.2 mg total) by mouth 2 (two) times daily. (Patient taking differently: Take 0.2 mg by mouth daily. )   . diclofenac sodium (VOLTAREN) 1 % GEL Apply twice a day for affected area on ankle as needed (Patient taking differently: Apply 4 g topically 2 (two) times daily as needed. Apply twice a day for affected area on ankle as needed)   . furosemide (LASIX) 40 MG tablet Take 1 tablet (40 mg total) by mouth daily.   [name] gabapentin (NEURONTIN) 300 MG capsule TAKE 2 CAPSULES BY MOUTH TWICE DAILY, MAY TAKE AN ADDTIONAL 2 CAPSULES MIDDAY AS NEEDED FOR PAIN   . insulin glargine (LANTUS SOLOSTAR) 100 UNIT/ML Solostar Pen Inject 42 Units into the skin at bedtime. 03/03/2020: Patient took 42 units at 2000  on 03/02/20  . iron polysaccharides (NIFEREX) 150 MG capsule Take 1 capsule (150 mg total) by mouth daily.   . Lancets (ONETOUCH DELICA PLUS [id number]) MISC USE TO TEST BLOOD SUGAR FOUR TIMES DAILY (Patient taking differently: 1 Device by Other route in the morning, at noon, in the evening, and at bedtime. )   . lisinopril (ZESTRIL) 20 MG tablet Take 1 tablet (20 mg total) by mouth daily.   [name] lubiprostone (AMITIZA) 24 MCG capsule Take 1 capsule (24 mcg total) by mouth 2 (two) times daily with a meal.   . metoprolol succinate (TOPROL-XL) 50 MG 24 hr tablet TAKE 1 TABLET BY MOUTH EVERY DAY WITH OR IMMEDIATELY FOLLOWING A MEAL   . nystatin (MYCOSTATIN/NYSTOP) powder Apply topically 3 (three) times daily.   [name] VERIO test strip USE TO CHECK BLOOD SUGAR FOUR TIMES DAILY (Patient taking differently: 1 each by Other route in the morning, at noon, in the evening, and at bedtime. )   . oxyCODONE-acetaminophen (PERCOCET) 7.5-325 MG tablet Take 1 tablet by mouth every 4 (four) hours as needed.   . pantoprazole (PROTONIX) 40 MG tablet Take 1 tablet (40 mg total) by mouth daily.   . simvastatin (ZOCOR) 20 MG tablet TAKE 1 TABLET(20 MG) BY MOUTH AT BEDTIME   . tamsulosin (FLOMAX) 0.4 MG CAPS capsule Take 1 capsule (0.4 mg total) by mouth daily.   [name] tiZANidine (ZANAFLEX) 2 MG tablet TAKE 1 TABLET(2 MG) BY MOUTH TWICE DAILY AS NEEDED   .  Triamcinolone Acetonide (TRIAMCINOLONE 0.1 % CREAM : EUCERIN) CREA Apply 1 application topically 2 (two) times daily as needed. (Patient taking differently: Apply 1 application topically 2 (two) times daily as needed for rash. )   . [DISCONTINUED] furosemide (LASIX) 40 MG tablet Take 1 tablet (40 mg total) by mouth daily.    No facility-administered encounter medications on file as of 09/03/2020.    Goals Addressed            This Visit's Progress   . Pharmacy Care Plan:       CARE PLAN ENTRY (see longitudinal plan of care for additional care plan  information)  Current Barriers:  . Chronic Disease Management support, education, and care coordination needs related to Hypertension, Hyperlipidemia, Diabetes, and GERD   Hypertension BP Readings from Last 3 Encounters:  05/19/20 120/80  05/18/20 120/74  04/10/20 140/78   . Pharmacist Clinical Goal(s): o Over the next 180 days, patient will work with PharmD and providers to maintain BP goal <140/90 . Current regimen:  o Amlodipine 32m o Lisinopril 266mo Metoprolol succinate 5014mo Clonidine 0.2mg56mce daily . Interventions: o Reviewed medication regimen o Reviewed home BP readings . Patient self care activities - Over the next 180 days, patient will: o Check BP once daily, document, and provide at future appointments o Ensure daily salt intake < 2300 mg/day   Hyperlipidemia Lab Results  Component Value Date/Time   LDLCALC 64 05/12/2020 09:48 AM   . Pharmacist Clinical Goal(s): o Over the next 180 days, patient will work with PharmD and providers to maintain LDL goal < 70 . Current regimen:  o Simvastatin 20mg34mnterventions: o Comprehensive medication review o Reviewed most recent lab results . Patient self care activities - Over the next 180 days, patient will: o Continue to focus on medication adherence by pill box o Contact PharmD or PCP with any questions or concerns  Diabetes Lab Results  Component Value Date/Time   HGBA1C 7.4 (H) 03/03/2020 08:15 AM   HGBA1C 6.1 (H) 10/08/2019 08:29 AM   HGBA1C 6.7 12/03/2015 12:00 AM   . Pharmacist Clinical Goal(s): o Over the next 180 days, patient will work with PharmD and providers to achieve A1c goal <7% . Current regimen:  o Lantus 42 units at bedtime . Interventions: o Reviewed home blood sugar logs o Discussed diet and limiting sweet foods to once or twice weekly . Patient self care activities - Over the next 180 days, patient will: o Check blood sugar twice daily, document, and provide at future  appointments o Contact provider with any episodes of hypoglycemia < 70 o Contact provider with blood sugar > 130 in the mornings or > 180 2 hours after meals o Limit consumption of sweet foods to once or twice weekly  GERD . Pharmacist Clinical Goal(s) o Over the next 180 days, patient will work with PharmD and providers to optimize medication and minimize symptoms related to GERD . Current regimen:  o Pantoprazole 40mg 82my . Interventions: o Discussed medication timing o Reviewed severity of patient symptoms . Patient self care activities - Over the next 180 days, patient will: o Continue to take medication as directed o Contact PharmD or PCP with any change in symptoms   Initial goal documentation       Diabetes   A1c goal <7%  Recent Relevant Labs: Lab Results  Component Value Date/Time   HGBA1C 6.2 (H) 09/02/2020 10:19 AM   HGBA1C 7.4 (H) 03/03/2020 08:15 AM  HGBA1C 6.7 12/03/2015 12:00 AM   MICROALBUR 123.0 05/12/2020 09:48 AM   MICROALBUR 232.3 05/04/2017 09:31 AM    Last diabetic Eye exam:  Lab Results  Component Value Date/Time   HMDIABEYEEXA No Retinopathy 10/16/2018 12:00 AM    Last diabetic Foot exam: No results found for: HMDIABFOOTEX   Checking BG: Daily  Recent FBG Readings: 84 this AM   Patient has failed these meds in past: none noted Patient is currently controlled on the following medications: . Lantus Solostar 42 units hs  A1c on 9/22 showed tremendous improvement from march.  Patient has d/c Metformin due to declining renal function.  He continues to work on his diet and cut back on sweet snack foods.  Counseled on hypoglycemia and importance of monitoring blood sugar.  Plan  Continue current medications, contact us if he starts having hypoglycemia Hypertension   BP goal is:  <130/80  Office blood pressures are  BP Readings from Last 3 Encounters:  09/02/20 136/82  08/14/20 (!) 177/103  07/24/20 128/70   Patient checks BP at  home daily Patient home BP readings are ranging: 108/77, 79  Patient has failed these meds in the past: none noted Patient is currently controlled on the following medications:  . Amlodipine 51m . Clonidine 0.2 mg once daily . Furosemide 443mdaily . Lisinopril 2076m Metoprolol XL 12m12mily  Pt BP has been uncontrolled lately, however based on last office BP and todays home reading seems to be leveling out.  His wife reports that she does not have lasix at home and based on fill history it does not look like he has had it filled in a while.  Will confirm nobody d/c and will request refill sent in as she reports some swelling in his leg.  Plan  Continue current medications, refill furosemide if appropriate       ChriBeverly MilcharmD Clinical Pharmacist [phone]

## 2020-09-03 NOTE — Patient Instructions (Addendum)
Visit Information Thank you for meeting with me today!  I look forward to working with you to help you meet all of your healthcare goals and answer any questions you may have.  Feel free to contact me anytime!  Goals Addressed            This Visit's Progress   . Pharmacy Care Plan:       CARE PLAN ENTRY (see longitudinal plan of care for additional care plan information)  Current Barriers:  . Chronic Disease Management support, education, and care coordination needs related to Hypertension, Hyperlipidemia, Diabetes, and GERD   Hypertension BP Readings from Last 3 Encounters:  05/19/20 120/80  05/18/20 120/74  04/10/20 140/78   . Pharmacist Clinical Goal(s): o Over the next 180 days, patient will work with PharmD and providers to maintain BP goal <140/90 . Current regimen:  o Amlodipine 92m o Lisinopril 238mo Metoprolol succinate 5069mo Clonidine 0.2mg80mce daily . Interventions: o Reviewed medication regimen o Reviewed home BP readings . Patient self care activities - Over the next 180 days, patient will: o Check BP once daily, document, and provide at future appointments o Ensure daily salt intake < 2300 mg/day   Hyperlipidemia Lab Results  Component Value Date/Time   LDLCALC 64 05/12/2020 09:48 AM   . Pharmacist Clinical Goal(s): o Over the next 180 days, patient will work with PharmD and providers to maintain LDL goal < 70 . Current regimen:  o Simvastatin 20mg48mnterventions: o Comprehensive medication review o Reviewed most recent lab results . Patient self care activities - Over the next 180 days, patient will: o Continue to focus on medication adherence by pill box o Contact PharmD or PCP with any questions or concerns  Diabetes Lab Results  Component Value Date/Time   HGBA1C 7.4 (H) 03/03/2020 08:15 AM   HGBA1C 6.1 (H) 10/08/2019 08:29 AM   HGBA1C 6.7 12/03/2015 12:00 AM   . Pharmacist Clinical Goal(s): o Over the next 180 days, patient will  work with PharmD and providers to achieve A1c goal <7% . Current regimen:  o Lantus 42 units at bedtime . Interventions: o Reviewed home blood sugar logs o Discussed diet and limiting sweet foods to once or twice weekly . Patient self care activities - Over the next 180 days, patient will: o Check blood sugar twice daily, document, and provide at future appointments o Contact provider with any episodes of hypoglycemia < 70 o Contact provider with blood sugar > 130 in the mornings or > 180 2 hours after meals o Limit consumption of sweet foods to once or twice weekly  GERD . Pharmacist Clinical Goal(s) o Over the next 180 days, patient will work with PharmD and providers to optimize medication and minimize symptoms related to GERD . Current regimen:  o Pantoprazole 40mg 51my . Interventions: o Discussed medication timing o Reviewed severity of patient symptoms . Patient self care activities - Over the next 180 days, patient will: o Continue to take medication as directed o Contact PharmD or PCP with any change in symptoms   Initial goal documentation        The patient verbalized understanding of instructions provided today and agreed to receive a mailed copy of patient instruction and/or educational materials.  Telephone follow up appointment with pharmacy team member scheduled for: 6 months  ChristBeverly MilchmD Clinical Pharmacist [place] [phone] Your Hypertension Hypertension is commonly called high blood pressure. This  is when the force of your blood pressing against the walls of your arteries is too strong. Arteries are blood vessels that carry blood from your heart throughout your body. Hypertension forces the heart to work harder to pump blood, and may cause the arteries to become narrow or stiff. Having untreated or uncontrolled hypertension can cause heart attack, stroke, kidney disease, and other problems. What are blood  pressure readings? A blood pressure reading consists of a higher number over a lower number. Ideally, your blood pressure should be below 120/80. The first ("top") number is called the systolic pressure. It is a measure of the pressure in your arteries as your heart beats. The second ("bottom") number is called the diastolic pressure. It is a measure of the pressure in your arteries as the heart relaxes. What does my blood pressure reading mean? Blood pressure is classified into four stages. Based on your blood pressure reading, your health care provider may use the following stages to determine what type of treatment you need, if any. Systolic pressure and diastolic pressure are measured in a unit called mm Hg. Normal  Systolic pressure: below 956.  Diastolic pressure: below 80. Elevated  Systolic pressure: 387-564.  Diastolic pressure: below 80. Hypertension stage 1  Systolic pressure: 332-951.  Diastolic pressure: 88-41. Hypertension stage 2  Systolic pressure: 660 or above.  Diastolic pressure: 90 or above. What health risks are associated with hypertension? Managing your hypertension is an important responsibility. Uncontrolled hypertension can lead to:  A heart attack.  A stroke.  A weakened blood vessel (aneurysm).  Heart failure.  Kidney damage.  Eye damage.  Metabolic syndrome.  Memory and concentration problems. What changes can I make to manage my hypertension? Hypertension can be managed by making lifestyle changes and possibly by taking medicines. Your health care provider will help you make a plan to bring your blood pressure within a normal range. Eating and drinking   Eat a diet that is high in fiber and potassium, and low in salt (sodium), added sugar, and fat. An example eating plan is called the DASH (Dietary Approaches to Stop Hypertension) diet. To eat this way: ? Eat plenty of fresh fruits and vegetables. Try to fill half of your plate at each  meal with fruits and vegetables. ? Eat whole grains, such as whole wheat pasta, brown rice, or whole grain bread. Fill about one quarter of your plate with whole grains. ? Eat low-fat diary products. ? Avoid fatty cuts of meat, processed or cured meats, and poultry with skin. Fill about one quarter of your plate with lean proteins such as fish, chicken without skin, beans, eggs, and tofu. ? Avoid premade and processed foods. These tend to be higher in sodium, added sugar, and fat.  Reduce your daily sodium intake. Most people with hypertension should eat less than 1,500 mg of sodium a day.  Limit alcohol intake to no more than 1 drink a day for nonpregnant women and 2 drinks a day for men. One drink equals 12 oz of beer, 5 oz of wine, or 1 oz of hard liquor. Lifestyle  Work with your health care provider to maintain a healthy body weight, or to lose weight. Ask what an ideal weight is for you.  Get at least 30 minutes of exercise that causes your heart to beat faster (aerobic exercise) most days of the week. Activities may include walking, swimming, or biking.  Include exercise to strengthen your muscles (resistance exercise), such as weight lifting,  as part of your weekly exercise routine. Try to do these types of exercises for 30 minutes at least 3 days a week.  Do not use any products that contain nicotine or tobacco, such as cigarettes and e-cigarettes. If you need help quitting, ask your health care provider.  Control any long-term (chronic) conditions you have, such as high cholesterol or diabetes. Monitoring  Monitor your blood pressure at home as told by your health care provider. Your personal target blood pressure may vary depending on your medical conditions, your age, and other factors.  Have your blood pressure checked regularly, as often as told by your health care provider. Working with your health care provider  Review all the medicines you take with your health care  provider because there may be side effects or interactions.  Talk with your health care provider about your diet, exercise habits, and other lifestyle factors that may be contributing to hypertension.  Visit your health care provider regularly. Your health care provider can help you create and adjust your plan for managing hypertension. Will I need medicine to control my blood pressure? Your health care provider may prescribe medicine if lifestyle changes are not enough to get your blood pressure under control, and if:  Your systolic blood pressure is 130 or higher.  Your diastolic blood pressure is 80 or higher. Take medicines only as told by your health care provider. Follow the directions carefully. Blood pressure medicines must be taken as prescribed. The medicine does not work as well when you skip doses. Skipping doses also puts you at risk for problems. Contact a health care provider if:  You think you are having a reaction to medicines you have taken.  You have repeated (recurrent) headaches.  You feel dizzy.  You have swelling in your ankles.  You have trouble with your vision. Get help right away if:  You develop a severe headache or confusion.  You have unusual weakness or numbness, or you feel faint.  You have severe pain in your chest or abdomen.  You vomit repeatedly.  You have trouble breathing. Summary  Hypertension is when the force of blood pumping through your arteries is too strong. If this condition is not controlled, it may put you at risk for serious complications.  Your personal target blood pressure may vary depending on your medical conditions, your age, and other factors. For most people, a normal blood pressure is less than 120/80.  Hypertension is managed by lifestyle changes, medicines, or both. Lifestyle changes include weight loss, eating a healthy, low-sodium diet, exercising more, and limiting alcohol. This information is not intended to  replace advice given to you by your health care provider. Make sure you discuss any questions you have with your health care provider. Document Revised: 03/22/2019 Document Reviewed: 10/26/2016 Elsevier Patient Education  [place].

## 2020-09-04 DIAGNOSIS — I10 Essential (primary) hypertension: Secondary | ICD-10-CM | POA: Diagnosis not present

## 2020-09-04 DIAGNOSIS — Z993 Dependence on wheelchair: Secondary | ICD-10-CM | POA: Diagnosis not present

## 2020-09-04 DIAGNOSIS — Z79891 Long term (current) use of opiate analgesic: Secondary | ICD-10-CM | POA: Diagnosis not present

## 2020-09-04 DIAGNOSIS — S34112D 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, subsequent encounter: Secondary | ICD-10-CM | POA: Diagnosis not present

## 2020-09-04 DIAGNOSIS — C07 Malignant neoplasm of parotid gland: Secondary | ICD-10-CM | POA: Diagnosis not present

## 2020-09-04 DIAGNOSIS — L89152 Pressure ulcer of sacral region, stage 2: Secondary | ICD-10-CM | POA: Diagnosis not present

## 2020-09-04 DIAGNOSIS — I69351 Hemiplegia and hemiparesis following cerebral infarction affecting right dominant side: Secondary | ICD-10-CM | POA: Diagnosis not present

## 2020-09-04 DIAGNOSIS — R591 Generalized enlarged lymph nodes: Secondary | ICD-10-CM | POA: Diagnosis not present

## 2020-09-04 DIAGNOSIS — Z794 Long term (current) use of insulin: Secondary | ICD-10-CM | POA: Diagnosis not present

## 2020-09-08 DIAGNOSIS — C07 Malignant neoplasm of parotid gland: Secondary | ICD-10-CM | POA: Diagnosis not present

## 2020-09-08 DIAGNOSIS — I10 Essential (primary) hypertension: Secondary | ICD-10-CM | POA: Diagnosis not present

## 2020-09-08 DIAGNOSIS — R591 Generalized enlarged lymph nodes: Secondary | ICD-10-CM | POA: Diagnosis not present

## 2020-09-08 DIAGNOSIS — Z79891 Long term (current) use of opiate analgesic: Secondary | ICD-10-CM | POA: Diagnosis not present

## 2020-09-08 DIAGNOSIS — Z993 Dependence on wheelchair: Secondary | ICD-10-CM | POA: Diagnosis not present

## 2020-09-08 DIAGNOSIS — I69351 Hemiplegia and hemiparesis following cerebral infarction affecting right dominant side: Secondary | ICD-10-CM | POA: Diagnosis not present

## 2020-09-08 DIAGNOSIS — S34112D 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, subsequent encounter: Secondary | ICD-10-CM | POA: Diagnosis not present

## 2020-09-08 DIAGNOSIS — L89152 Pressure ulcer of sacral region, stage 2: Secondary | ICD-10-CM | POA: Diagnosis not present

## 2020-09-08 DIAGNOSIS — Z794 Long term (current) use of insulin: Secondary | ICD-10-CM | POA: Diagnosis not present

## 2020-09-15 ENCOUNTER — Other Ambulatory Visit: Payer: Self-pay | Admitting: *Deleted

## 2020-09-15 MED ORDER — OXYCODONE-ACETAMINOPHEN 7.5-325 MG PO TABS
1.0000 | ORAL_TABLET | ORAL | 0 refills | Status: DC | PRN
Start: 2020-09-15 — End: 2020-11-18

## 2020-09-15 NOTE — Telephone Encounter (Signed)
Received call from patient wife.   Requested refill on Oxycodone/APAP.   Ok to refill??  Last office visit 09/02/2020.  Last refill 06/30/2020.

## 2020-09-17 ENCOUNTER — Other Ambulatory Visit: Payer: Self-pay | Admitting: Family Medicine

## 2020-09-25 ENCOUNTER — Encounter: Payer: Self-pay | Admitting: Family Medicine

## 2020-09-25 ENCOUNTER — Ambulatory Visit (INDEPENDENT_AMBULATORY_CARE_PROVIDER_SITE_OTHER): Payer: Medicare Other | Admitting: Family Medicine

## 2020-09-25 ENCOUNTER — Other Ambulatory Visit: Payer: Self-pay

## 2020-09-25 VITALS — BP 132/82 | HR 82 | Temp 98.0°F | Resp 14

## 2020-09-25 DIAGNOSIS — H1031 Unspecified acute conjunctivitis, right eye: Secondary | ICD-10-CM

## 2020-09-25 DIAGNOSIS — J310 Chronic rhinitis: Secondary | ICD-10-CM | POA: Diagnosis not present

## 2020-09-25 MED ORDER — POLYMYXIN B-TRIMETHOPRIM 10000-0.1 UNIT/ML-% OP SOLN: OPHTHALMIC | 0 refills | Status: DC

## 2020-09-25 MED ORDER — FLUTICASONE PROPIONATE 50 MCG/ACT NA SUSP: 2.0000 | Freq: Every day | NASAL | 2 refills | Status: DC

## 2020-09-25 NOTE — Progress Notes (Signed)
   Subjective:    Patient ID: [name], male    DOB: [birth year], 74 y.o.   MRN: [id number]  Patient presents for Swelling Below Eye (swelling under R eye- will change in size)  Pt here with swelling beneath right eye for the past few days. He has also had increased drainage from right eye.No fever, no tearing, no change in vision, swelling has gone down in the past 24 hours on its own, no injury to the face  No other changes   He also has constant drainge from his nose, always clear, worse first thing in the morning. He has not used any nasal sprays  Review Of Systems:  GEN- denies fatigue, fever, weight loss,weakness, recent illness HEENT- +eye drainage, denies change in vision, nasal discharge, CVS- denies chest pain, palpitations RESP- denies SOB, cough, wheeze ABD- denies N/V, change in stools, abd pain Neuro- denies headache, dizziness, syncope, seizure activity       Objective:    BP 132/82   Pulse 82   Temp 98 F (36.7 C) (Temporal)   Resp 14   SpO2 98%   GEN- NAD, alert and oriented x3,wheelchair bound  HEENT- PERRL, EOMI, non injected sclera,mild injected right lower conjunctiva, mild swelling beneath eyelid (bag) MMM, oropharynx clear, nares clear rhinorrhea  Neck- Supple, no thyromegaly, small Right submanibular node, scarring right neck, RESP-CTAB CVS- RRR, no murmur          Assessment & Plan:      Problem List Items Addressed This Visit    None    Visit Diagnoses    Acute bacterial conjunctivitis of right eye    -  Primary   concern for acute ? infection of eye, mild swelling beneath the eye, use warm compresses, no erythema of lids, topical antibiotic drops given, no sign of peri-orbital cellullitus   Chronic rhinitis       Trial of nasal spray, if not response, may need ENT to r/o CSF leak      Note: This dictation was prepared with Dragon dictation along with smaller phrase technology. Any transcriptional errors that result from this  process are unintentional.

## 2020-09-25 NOTE — Patient Instructions (Signed)
Use Eye drop  Warm compresses Try nasal spray  F/U as previous

## 2020-09-27 ENCOUNTER — Encounter: Payer: Self-pay | Admitting: Family Medicine

## 2020-10-02 ENCOUNTER — Other Ambulatory Visit: Payer: Self-pay | Admitting: Family Medicine

## 2020-10-05 ENCOUNTER — Other Ambulatory Visit: Payer: Self-pay | Admitting: Family Medicine

## 2020-10-08 ENCOUNTER — Telehealth: Payer: Self-pay | Admitting: Pharmacist

## 2020-10-08 NOTE — Progress Notes (Signed)
Chronic Care Management Pharmacy Assistant   Name: [name]  MRN: [id number] DOB: 09-13-1946  Reason for Encounter: Disease State  Patient Questions:  1.  Have you seen any other providers since your last visit? Yes  2.  Any changes in your medicines or health? Yes   PCP : [name], MD  Their chronic conditions include: hypertension, GERD/barretts Esophagus, Type II DM, hypothyroidism, CKD, hyperlipidemia, CVA.  Office Visit: 09-25-2020 (PCP) Patient presented in the office c/o swelling below the right eye for the past few days. A;sp reported increased drainage from the right eye. Was prescribed trimethoprim-polymyxin b (POLYTRIM) ophthalmic solution advised to use a warm compress at the area.  Allergies:  No Known Allergies  Medications: Outpatient Encounter Medications as of 10/08/2020  Medication Sig Note  . allopurinol (ZYLOPRIM) 300 MG tablet TAKE 1 TABLET(300 MG) BY MOUTH DAILY   . amLODipine (NORVASC) 10 MG tablet TAKE 1 TABLET(10 MG) BY MOUTH DAILY   . cloNIDine (CATAPRES) 0.2 MG tablet Take 1 tablet (0.2 mg total) by mouth 2 (two) times daily. (Patient taking differently: Take 0.2 mg by mouth daily. )   . diclofenac sodium (VOLTAREN) 1 % GEL Apply twice a day for affected area on ankle as needed (Patient taking differently: Apply 4 g topically 2 (two) times daily as needed. Apply twice a day for affected area on ankle as needed)   . fluticasone (FLONASE) 50 MCG/ACT nasal spray Place 2 sprays into both nostrils daily.   . furosemide (LASIX) 40 MG tablet Take 1 tablet (40 mg total) by mouth daily.   [name] gabapentin (NEURONTIN) 300 MG capsule TAKE 2 CAPSULES BY MOUTH TWICE DAILY, MAY TAKE AN ADDTIONAL 2 CAPSULES MIDDAY AS NEEDED FOR PAIN   . insulin glargine (LANTUS SOLOSTAR) 100 UNIT/ML Solostar Pen Inject 42 Units into the skin at bedtime. 03/03/2020: Patient took 42 units at 2000 on 03/02/20  . iron polysaccharides (NIFEREX) 150 MG capsule Take 1 capsule (150  mg total) by mouth daily.   . Lancets (ONETOUCH DELICA PLUS [id number]) MISC USE TO TEST BLOOD SUGAR FOUR TIMES DAILY (Patient taking differently: 1 Device by Other route in the morning, at noon, in the evening, and at bedtime. )   . lisinopril (ZESTRIL) 20 MG tablet TAKE 1 TABLET(20 MG) BY MOUTH DAILY   . lubiprostone (AMITIZA) 24 MCG capsule Take 1 capsule (24 mcg total) by mouth 2 (two) times daily with a meal.   . metoprolol succinate (TOPROL-XL) 50 MG 24 hr tablet TAKE 1 TABLET BY MOUTH EVERY DAY WITH OR IMMEDIATELY FOLLOWING A MEAL   . nystatin (MYCOSTATIN/NYSTOP) powder Apply topically 3 (three) times daily.   [name] VERIO test strip USE TO CHECK BLOOD SUGAR FOUR TIMES DAILY (Patient taking differently: 1 each by Other route in the morning, at noon, in the evening, and at bedtime. )   . oxyCODONE-acetaminophen (PERCOCET) 7.5-325 MG tablet Take 1 tablet by mouth every 4 (four) hours as needed.   . pantoprazole (PROTONIX) 40 MG tablet TAKE 1 TABLET(40 MG) BY MOUTH DAILY   . simvastatin (ZOCOR) 20 MG tablet TAKE 1 TABLET(20 MG) BY MOUTH AT BEDTIME   . tamsulosin (FLOMAX) 0.4 MG CAPS capsule TAKE 1 CAPSULE(0.4 MG) BY MOUTH DAILY   . tiZANidine (ZANAFLEX) 2 MG tablet TAKE 1 TABLET(2 MG) BY MOUTH TWICE DAILY AS NEEDED   . Triamcinolone Acetonide (TRIAMCINOLONE 0.1 % CREAM : EUCERIN) CREA Apply 1 application topically 2 (two) times daily as needed. (Patient taking  differently: Apply 1 application topically 2 (two) times daily as needed for rash. )   . trimethoprim-polymyxin b (POLYTRIM) ophthalmic solution 1 drop four times a day to right eye for 5 days    No facility-administered encounter medications on file as of 10/08/2020.    Current Diagnosis: Patient Active Problem List   Diagnosis Date Noted  . GI bleed 03/18/2020  . Acute lower GI bleeding 03/03/2020  . Pressure injury of skin 03/03/2020  . Anemia   . Thoracic aortic aneurysm without rupture ([place]) 11/19/2019  . Aortic  atherosclerosis ([place]) 11/19/2019  . Protein-calorie malnutrition ([place]) 06/10/2019  . Vitamin D deficiency 03/07/2019  . Hypogonadism, male 07/26/2018  . Gynecomastia, male 06/27/2018  . Malignant neoplasm of upper lobe of left lung ([place]) 04/06/2018  . History of parotid cancer 02/07/2018  . RUQ pain 11/09/2016  . Colon adenomas 11/09/2016  . Primary hypothyroidism 09/09/2015  . Chronic constipation 05/22/2015  . Candidiasis, intertrigo 09/05/2014  . Arthritis, shoulder region 12/10/2013  . Impetigo bullosa 10/23/2013  . Intertrigo 10/12/2013  . Chronic left shoulder pain 09/25/2013  . Muscle weakness (generalized) 09/25/2013  . Hypercalcemia 08/08/2013  . CKD (chronic kidney disease), stage III ([place]) 08/07/2013  . AKI (acute kidney injury) ([place]) 07/24/2013  . Adhesive capsulitis of left shoulder 05/14/2013  . Steatohepatitis   . Diverticulosis   . GERD (gastroesophageal reflux disease)   . History of CVA (cerebrovascular accident)   . Hiatal hernia   . HLD (hyperlipidemia)   . Glaucoma (increased eye pressure)   . Esotropia of left eye   . BPH (benign prostatic hyperplasia)   . Hx of radiation therapy   . 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 Epidermal inclusion cyst 10/05/2012  . Neck pain 08/26/2012  . Chronic abdominal pain 08/26/2012  . Leg edema 08/24/2012  . Hemiplegia of dominant side as late effect following cerebrovascular disease ([place]) 06/06/2012  . Diarrhea 04/17/2012  . Obesity 04/17/2012  . Diabetic neuropathy ([place]) 02/13/2012  . Recurrent boils 01/12/2012  . 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 ([place]) 07/15/2011  . Hemorrhoids, internal 03/23/2011  . Hepatitis 03/02/2011  . EOSINOPHILIA 12/23/2009  . Type 2 diabetes mellitus with stage 2 chronic kidney disease, with long-term current use of insulin ([place]) 01/05/2009  . Gout, unspecified 01/05/2009  . HTN (hypertension) 01/05/2009  . BARRETTS ESOPHAGUS 01/05/2009  . DIVERTICULOSIS OF  COLON 01/05/2009  . History of lymphoma 01/05/2009    Goals Addressed   None    Reviewed chart prior to disease state call. Spoke with patient regarding BP  Recent Office Vitals: BP Readings from Last 3 Encounters:  09/25/20 132/82  09/02/20 136/82  08/14/20 (!) 177/103   Pulse Readings from Last 3 Encounters:  09/25/20 82  09/02/20 84  08/14/20 85    Wt Readings from Last 3 Encounters:  08/14/20 250 lb (113.4 kg)  03/04/20 248 lb 3.8 oz (112.6 kg)  09/06/19 250 lb (113.4 kg)     Kidney Function Lab Results  Component Value Date/Time   CREATININE 1.58 (H) 09/02/2020 10:19 AM   CREATININE 1.70 (H) 08/19/2020 09:47 AM   CREATININE 1.77 (H) 06/10/2020 04:22 PM   GFRNONAA 35 (L) 05/12/2020 09:48 AM   GFRAA 41 (L) 05/12/2020 09:48 AM    BMP Latest Ref Rng & Units 09/02/2020 08/19/2020 08/10/2020  Glucose 65 - 99 mg/dL 96 - -  BUN 7 - 25 mg/dL 15 - -  Creatinine 0.70 - 1.18 mg/dL 1.58(H) 1.70(H) -  BUN/Creat Ratio 6 - 22 (calc) 9 - -  Sodium 135 - 146 mmol/L 143 - -  Potassium 3.5 - 5.3 mmol/L 4.3 - -  Chloride 98 - 110 mmol/L 106 - -  CO2 20 - 32 mmol/L 27 - -  Calcium 8.6 - 10.3 mg/dL 10.4(H) - 10.2    . Current antihypertensive regimen:  ? Amlodipine 10 mg ? Lisinopril 20 mg ? Metoprolol succinate 50 mg  ? Clonidine 0.2 mg once daily   A second unsuccessful telephone outreach was attempted today. The patient was referred to pharmacist for assistance with care management and care coordination.    Follow-Up:  Pharmacist Review   Fanny Skates, [place] Pharmacist Assistant [phone]

## 2020-10-09 DIAGNOSIS — L89159 Pressure ulcer of sacral region, unspecified stage: Secondary | ICD-10-CM | POA: Diagnosis not present

## 2020-10-09 DIAGNOSIS — Z8673 Personal history of transient ischemic attack (TIA), and cerebral infarction without residual deficits: Secondary | ICD-10-CM | POA: Diagnosis not present

## 2020-10-09 DIAGNOSIS — I69959 Hemiplegia and hemiparesis following unspecified cerebrovascular disease affecting unspecified side: Secondary | ICD-10-CM | POA: Diagnosis not present

## 2020-10-09 DIAGNOSIS — S34112D 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, subsequent encounter: Secondary | ICD-10-CM | POA: Diagnosis not present

## 2020-10-18 IMAGING — CT CT ABD-PELV W/O CM
2 of 4 series · 16 of 46 positions shown, 18 images · non-contrast
Comparison: July 15, 2019

CLINICAL DATA: Abdominal pain and rectal bleeding

EXAM:
CT ABDOMEN AND PELVIS WITHOUT CONTRAST
TECHNIQUE: Multidetector CT imaging of the abdomen and pelvis was performed
following the standard protocol without IV contrast.

[Series 2: axial st · axial · 0.87mm/px · z∈[-605,-190]mm · 13 of 95 slices shown, 15 images]
[im 6/95  soft-tissue]
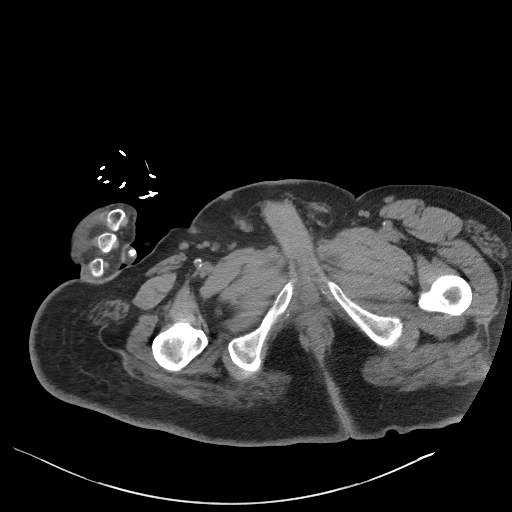
[im 6/95  bone]
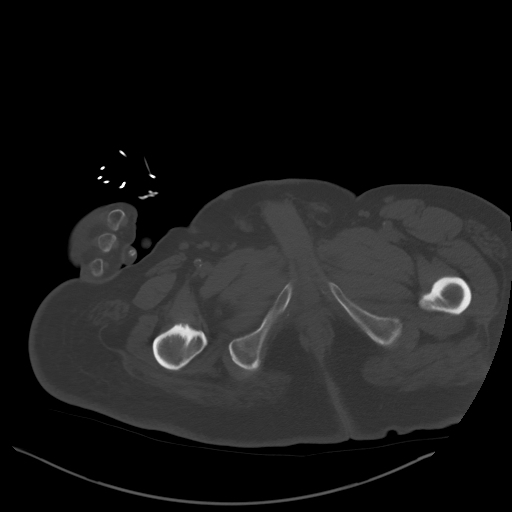
[im 12/95  soft-tissue]
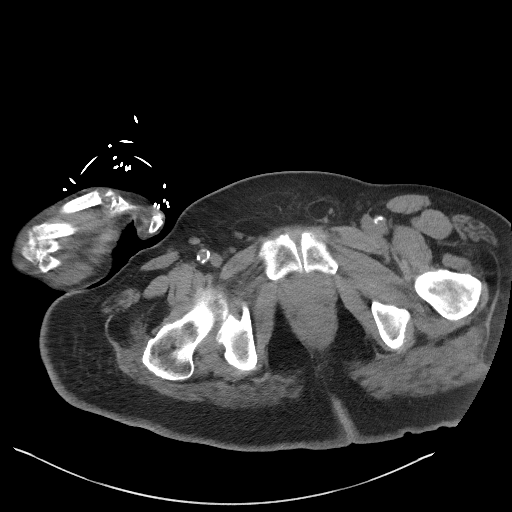
[im 23/95  soft-tissue]
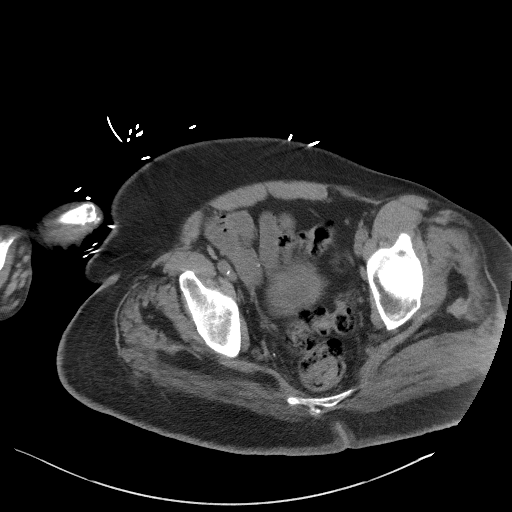
[im 28/95  soft-tissue]
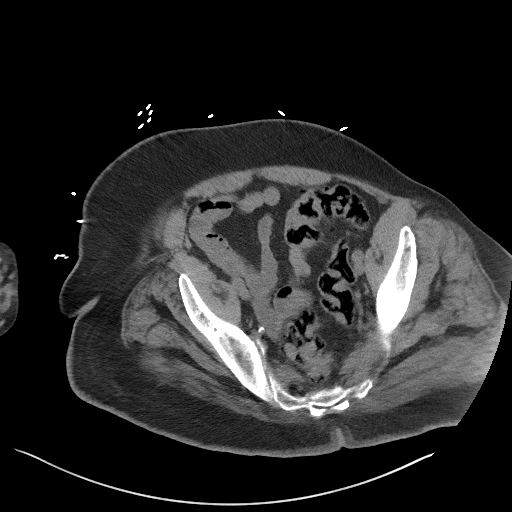
[im 34/95  soft-tissue]
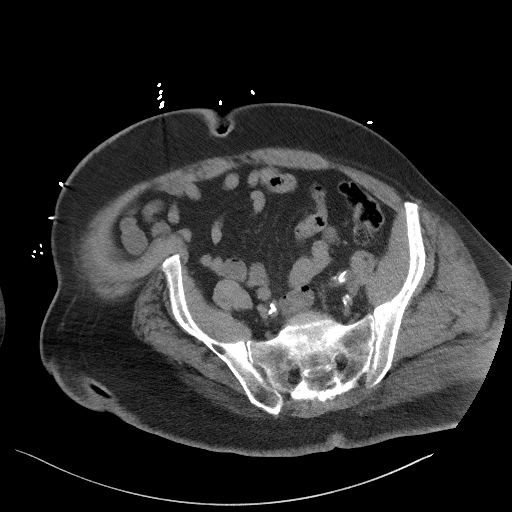
[im 39/95  soft-tissue]
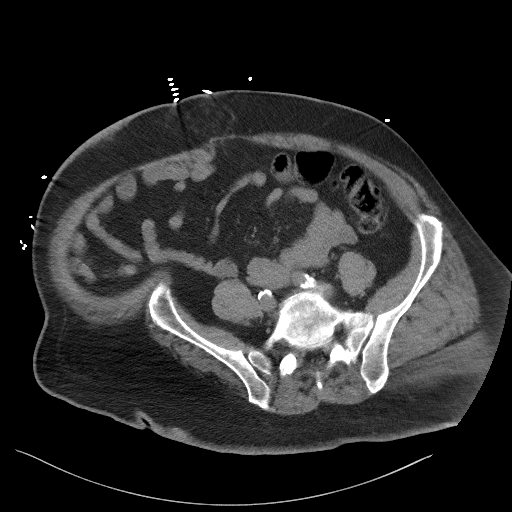
[im 50/95  soft-tissue]
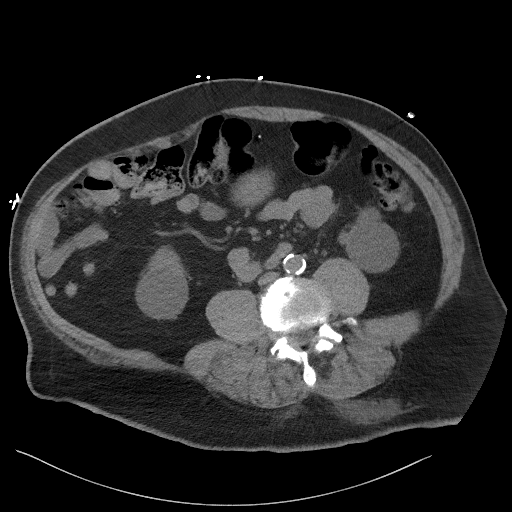
[im 56/95  soft-tissue]
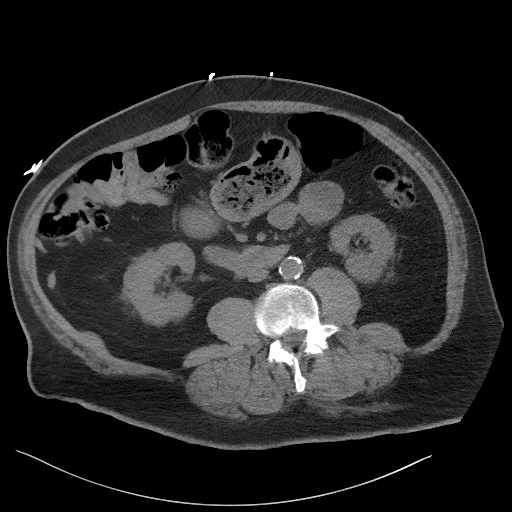
[im 61/95  soft-tissue]
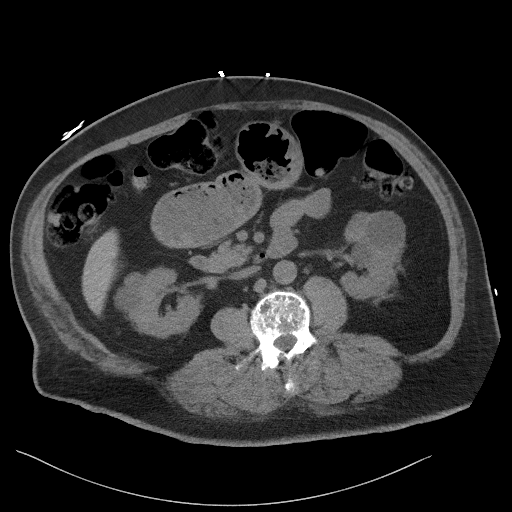
[im 61/95  bone]
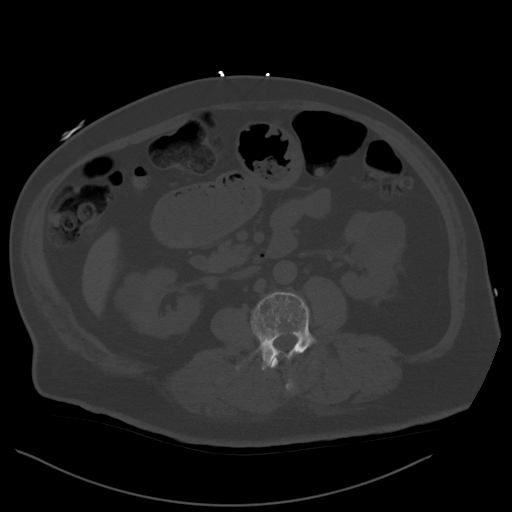
[im 67/95  soft-tissue]
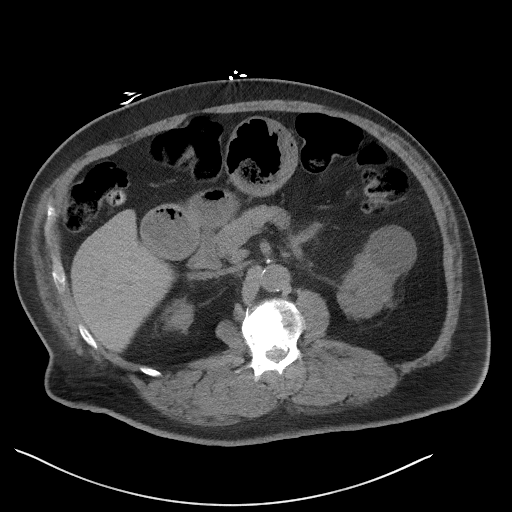
[im 72/95  soft-tissue]
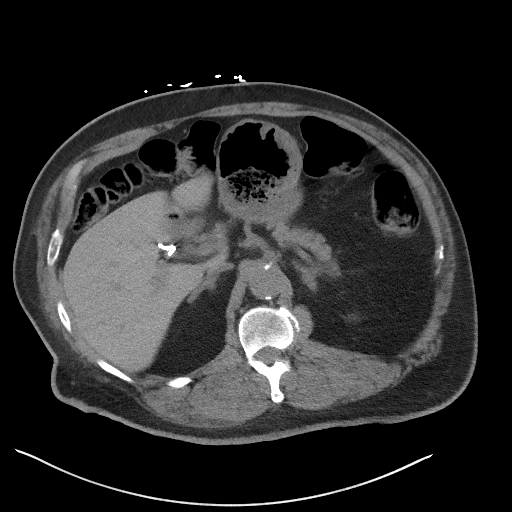
[im 83/95  soft-tissue]
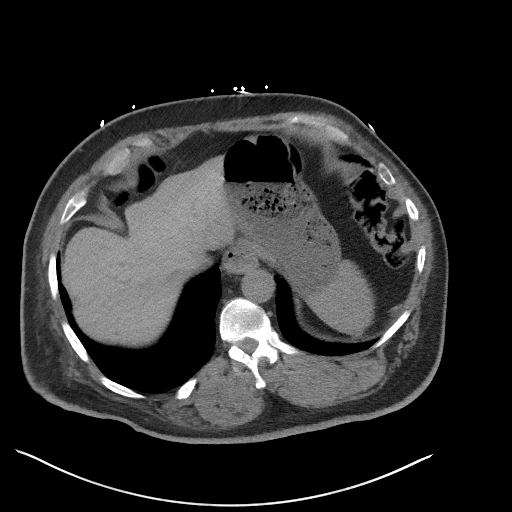
[im 89/95  soft-tissue]
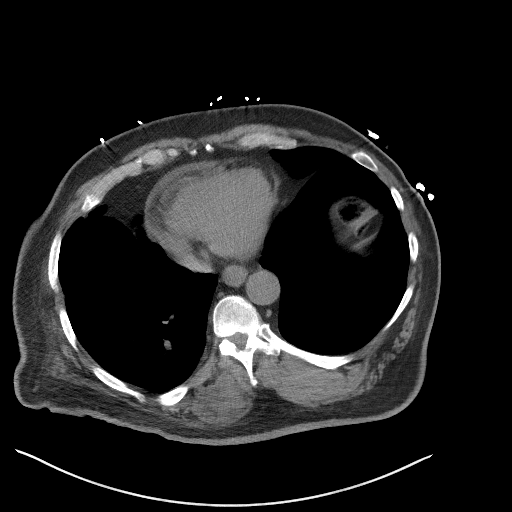

[Series 5: coronal st · coronal · 0.92mm/px · 3 of 112 slices shown]
[im 38/112  soft-tissue]
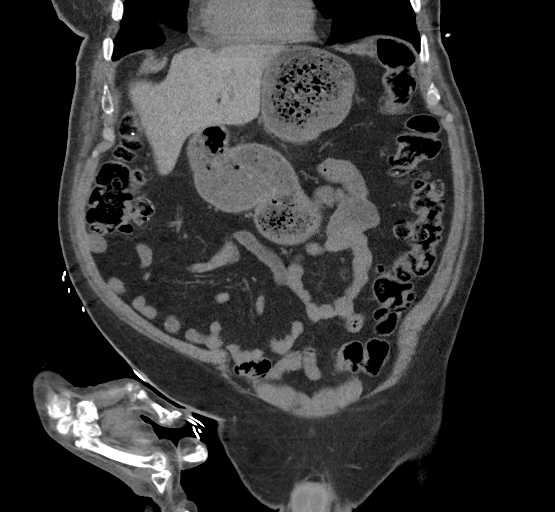
[im 50/112  soft-tissue]
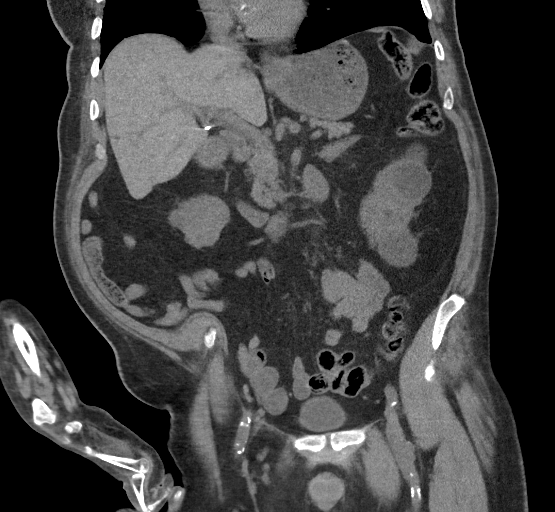
[im 62/112  soft-tissue]
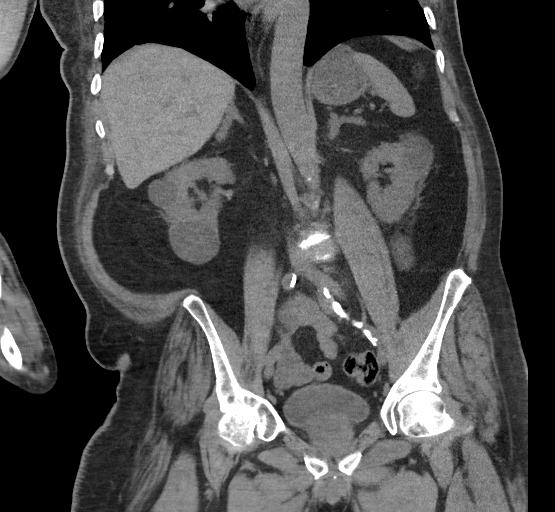

[16 of 46 positions shown; findings below may reference images not displayed]

FINDINGS: Lower chest: There is a small pericardial effusion present.

A small hiatal hernia is present.

The visualized portions of the lungs are clear.

Hepatobiliary: Although limited due to the lack of intravenous
contrast, normal in appearance without gross focal abnormality. The
patient is status post cholecystectomy. No biliary ductal dilation.

Pancreas:  Unremarkable.  No surrounding inflammatory changes.

Spleen: Normal in size. Although limited due to the lack of
intravenous contrast, normal in appearance.

Adrenals/Urinary Tract: Both adrenal glands appear normal. Multiple
bilateral low-density lesions are seen throughout both kidneys. The
largest measuring 5 cm off the lower pole of the left kidney and
cm off the lower pole of the right kidney which are likely renal
cysts. Bladder is unremarkable.

Stomach/Bowel: The stomach is mildly prominent and filled with
foodstuff and air. The small bowel is normal in appearance. There is
scattered colonic diverticula without diverticulitis. A moderate
amount of colonic stool is present.

Vascular/Lymphatic: There are no enlarged abdominal or pelvic lymph
nodes. Scattered aortic atherosclerotic calcifications are seen
without aneurysmal dilatation.

Reproductive: The prostate is unremarkable.

Other: Small fat containing inguinal hernias are present.

Musculoskeletal: No acute or significant osseous findings. Prior
cement fixation seen within the L1 vertebral body. Again noted are
lytic changes and irregularity within the L2 vertebral body as on
prior exam, and dating back to 6298.
IMPRESSION: No acute intra-abdominal or pelvic pathology to explain the
patient's symptoms.

Diverticulosis without diverticulitis.

Aortic Atherosclerosis ([id number]).

Small pericardial effusion

Bilateral renal cysts.

## 2020-10-22 DIAGNOSIS — H524 Presbyopia: Secondary | ICD-10-CM | POA: Diagnosis not present

## 2020-10-22 DIAGNOSIS — H5213 Myopia, bilateral: Secondary | ICD-10-CM | POA: Diagnosis not present

## 2020-10-22 DIAGNOSIS — E119 Type 2 diabetes mellitus without complications: Secondary | ICD-10-CM | POA: Diagnosis not present

## 2020-10-22 DIAGNOSIS — Z794 Long term (current) use of insulin: Secondary | ICD-10-CM | POA: Diagnosis not present

## 2020-10-22 DIAGNOSIS — H2512 Age-related nuclear cataract, left eye: Secondary | ICD-10-CM | POA: Diagnosis not present

## 2020-10-31 ENCOUNTER — Other Ambulatory Visit: Payer: Self-pay | Admitting: Family Medicine

## 2020-11-16 ENCOUNTER — Telehealth: Payer: Self-pay | Admitting: *Deleted

## 2020-11-16 ENCOUNTER — Encounter: Payer: Self-pay | Admitting: Family Medicine

## 2020-11-16 ENCOUNTER — Other Ambulatory Visit: Payer: Self-pay

## 2020-11-16 ENCOUNTER — Ambulatory Visit: Payer: Medicare Other | Admitting: Family Medicine

## 2020-11-16 ENCOUNTER — Ambulatory Visit
Admission: EM | Admit: 2020-11-16 | Discharge: 2020-11-16 | Disposition: A | Discharge status: Home / Self Care | Payer: Medicare Other | Attending: Family Medicine | Admitting: Family Medicine

## 2020-11-16 DIAGNOSIS — S6991XA Unspecified injury of right wrist, hand and finger(s), initial encounter: Secondary | ICD-10-CM

## 2020-11-16 DIAGNOSIS — S59911A Unspecified injury of right forearm, initial encounter: Secondary | ICD-10-CM

## 2020-11-16 DIAGNOSIS — S4991XA Unspecified injury of right shoulder and upper arm, initial encounter: Secondary | ICD-10-CM

## 2020-11-16 DIAGNOSIS — S59901A Unspecified injury of right elbow, initial encounter: Secondary | ICD-10-CM

## 2020-11-16 NOTE — Telephone Encounter (Signed)
Noted patient on schedule S/P fall with pain in side.   Call placed to patient wife to inquire.   Reports that patient had fall out of bed last week on to R side (affected side). States that patient is voicing C/O pain in arm and shoulder. Reports bruising to arm. Denies any notable fx.   Advised that patient will require imaging. Offered to order imaging prior to appointment today, but wife states that she will take patient to UC for evaluation.

## 2020-11-16 NOTE — ED Provider Notes (Signed)
[facility]    CSN: [id number] Arrival date & time: 11/16/20  1501      History   Chief Complaint Chief Complaint  Patient presents with  . Fall    HPI [name] is a 74 y.o. male.   HPI Patient presents today accompanied by his wife, wife is concerned that patient sustained a fracture to upper right extremity following a fall 1.5 weeks ago. Patient is has complained of pain from right shoulder extending down to his hand. Patient has lung cancer and hx of CVA resulting in right sided hemiplegia. He is prescribed chronic pain medication for which he can take around the clock, however, his spouse/caregiver, doesn't like to give him pain medication as she fears medication causes sedation and impairs him from healing.  Past Medical History:  Diagnosis Date  . Arthritis   . Asthma    "hx of"  . Barrett's esophagus    EGD 03/23/2011 & EGD 2/09 bx proven  . BPH (benign prostatic hyperplasia)   . Cancer of parotid gland ([place]) 11/23/12   Adenocarcinoma  . Chronic abdominal pain   . Chronic constipation   . Colon polyp 03/23/2011   tubular adenoma, Dr. Gala Romney  . 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 ([place]) 07/15/2011  . CVA (cerebral infarction) 1998   right sided deficit  . Delayed gastric emptying 2018  . Diverticulosis    TCS 03/23/11 pancolonic diverticula &TCS 5/08, pancolonic diverticula  . DM (diabetes mellitus) ([place])   . Edema of lower extremity 12/21/12   bilateral   . Esotropia of left eye   . GERD (gastroesophageal reflux disease)   . Glaucoma (increased eye pressure)   . Gout   . Gout   . Hemorrhagic colitis 06/06/2012.  [name] Hemorrhoids, internal 03/23/2011   tcs by Dr. Gala Romney  . Hepatitis    esosiniphilic, tx with prednisone  . Hiatal hernia   . History of radiation therapy 05/21/18- 05/30/18   Left Lung/ 54 Gy delivered in 3 fractions of 18 Gy. SBRT  . HTN (hypertension)   . Hx of radiation therapy 1974   right base of skull area-lymphoma   . Hyperlipidemia   . Lower facial weakness    Right  . Lymphoma ([place]) 1974   XRT at [facility], right base of skull area  . Neuropathy   . Non-small cell lung cancer ([place]) dx'd 04/26/18  . Peripheral edema    R>L legs  . Rash    chronic, recurrent, R>L legs  . Renal insufficiency   . Steatohepatitis    liver biopsy 2009  . Stroke [facility]) 1998   right hemiparesis/plegia    Patient Active Problem List   Diagnosis Date Noted  . GI bleed 03/18/2020  . Acute lower GI bleeding 03/03/2020  . Pressure injury of skin 03/03/2020  . Anemia   . Thoracic aortic aneurysm without rupture ([place]) 11/19/2019  . Aortic atherosclerosis ([place]) 11/19/2019  . Protein-calorie malnutrition ([place]) 06/10/2019  . Vitamin D deficiency 03/07/2019  . Hypogonadism, male 07/26/2018  . Gynecomastia, male 06/27/2018  . Malignant neoplasm of upper lobe of left lung ([place]) 04/06/2018  . History of parotid cancer 02/07/2018  . RUQ pain 11/09/2016  . Colon adenomas 11/09/2016  . Primary hypothyroidism 09/09/2015  . Chronic constipation 05/22/2015  . Candidiasis, intertrigo 09/05/2014  . Arthritis, shoulder region 12/10/2013  . Impetigo bullosa 10/23/2013  . Intertrigo 10/12/2013  . Chronic left shoulder pain 09/25/2013  . Muscle weakness (generalized) 09/25/2013  .  Hypercalcemia 08/08/2013  . CKD (chronic kidney disease), stage III ([place]) 08/07/2013  . AKI (acute kidney injury) ([place]) 07/24/2013  . Adhesive capsulitis of left shoulder 05/14/2013  . Steatohepatitis   . Diverticulosis   . GERD (gastroesophageal reflux disease)   . History of CVA (cerebrovascular accident)   . Hiatal hernia   . HLD (hyperlipidemia)   . Glaucoma (increased eye pressure)   . Esotropia of left eye   . BPH (benign prostatic hyperplasia)   . Hx of radiation therapy   . 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 Epidermal inclusion cyst 10/05/2012  . Neck pain 08/26/2012  . Chronic abdominal pain 08/26/2012  . Leg edema  08/24/2012  . Hemiplegia of dominant side as late effect following cerebrovascular disease ([place]) 06/06/2012  . Diarrhea 04/17/2012  . Obesity 04/17/2012  . Diabetic neuropathy ([place]) 02/13/2012  . Recurrent boils 01/12/2012  . 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 ([place]) 07/15/2011  . Hemorrhoids, internal 03/23/2011  . Hepatitis 03/02/2011  . EOSINOPHILIA 12/23/2009  . Type 2 diabetes mellitus with stage 2 chronic kidney disease, with long-term current use of insulin ([place]) 01/05/2009  . Gout, unspecified 01/05/2009  . HTN (hypertension) 01/05/2009  . BARRETTS ESOPHAGUS 01/05/2009  . DIVERTICULOSIS OF COLON 01/05/2009  . History of lymphoma 01/05/2009    Past Surgical History:  Procedure Laterality Date  . BIOPSY  12/01/2016   Procedure: BIOPSY;  Surgeon: Daneil Dolin, MD;  Location[facility];  Service: Endoscopy;;  duodenum, gastric, esophagus  . CHOLECYSTECTOMY    . COLONOSCOPY  03/23/11   Dr. Gala Romney  pancolonic diverticula, hemorrhoids, tubular adenoma.. next tcs 03/2016  . COLONOSCOPY WITH PROPOFOL N/A 12/01/2016   inadequate bowel prep precluded exam  . ESOPHAGOGASTRODUODENOSCOPY  02/05/08   goblet cell metaplasia/negative for H.pylori  . ESOPHAGOGASTRODUODENOSCOPY  03/23/11   Dr. Gala Romney, barretts, hiatal hernia  . ESOPHAGOGASTRODUODENOSCOPY (EGD) WITH PROPOFOL N/A 12/01/2016   Dr. Gala Romney: Large amount of retained gastric contents precluded completion of the stomach. Mucosal changes were found in the stomach. Erosions and somewhat scalloped appearing mucosa present, reactive gastritis/no H pylori. Barrett's esophagus noted, no dysplasia on biopsy. Duodenal biopsies taken as well, benign, no evidence of eosinophilia.  . IR FLUORO GUIDED NEEDLE PLC ASPIRATION/INJECTION LOC  12/13/2018  . IR KYPHO LUMBAR INC FX REDUCE BONE BX UNI/BIL CANNULATION INC/IMAGING  12/13/2018  . IR RADIOLOGY PERIPHERAL GUIDED IV START  12/13/2018  . IR US GUIDE VASC ACCESS LEFT  12/13/2018  . MASS  BIOPSY  11/01/2012   Procedure: NECK MASS BIOPSY;  Surgeon: Ascencion Dike, MD;  Location: [facility];  Service: ENT;  Laterality: Right;  Excisional Bx Right Neck Mass; attempted external jugular cutdown of left side  . PAROTIDECTOMY  11/24/2012   Procedure: PAROTIDECTOMY;  Surgeon: Ascencion Dike, MD;  Location: [place];  Service: ENT;  Laterality: N/A;  Total parotidectomy  . PLEURECTOMY    . right lymph node removal Right    behind right ear  . Right video-assisted thoracic surgery, pleurectomy, and pleurodesis  2011  . VIDEO BRONCHOSCOPY WITH ENDOBRONCHIAL NAVIGATION N/A 04/26/2018   Procedure: VIDEO BRONCHOSCOPY WITH ENDOBRONCHIAL NAVIGATION;  Surgeon: Melrose Nakayama, MD;  Location: [place];  Service: Thoracic;  Laterality: N/A;       Home Medications    Prior to Admission medications   Medication Sig Start Date End Date Taking? Authorizing Provider  allopurinol (ZYLOPRIM) 300 MG tablet TAKE 1 TABLET(300 MG) BY MOUTH DAILY 07/06/20  Yes [place], [name], MD  amLODipine (NORVASC) 10 MG tablet TAKE 1 TABLET(10 MG) BY MOUTH DAILY 10/02/20  Yes [place], [name], MD  cloNIDine (CATAPRES) 0.2 MG tablet Take 1 tablet (0.2 mg total) by mouth 2 (two) times daily. Patient taking differently: Take 0.2 mg by mouth daily.  07/24/20  Yes [place], [name], MD  diclofenac sodium (VOLTAREN) 1 % GEL Apply twice a day for affected area on ankle as needed Patient taking differently: Apply 4 g topically 2 (two) times daily as needed. Apply twice a day for affected area on ankle as needed 09/18/19  Yes [place], [name], MD  fluticasone [facility]) 50 MCG/ACT nasal spray Place 2 sprays into both nostrils daily. 09/25/20  Yes [place], [name], MD  furosemide (LASIX) 40 MG tablet Take 1 tablet (40 mg total) by mouth daily. 09/03/20  Yes [place], [name], MD  gabapentin (NEURONTIN) 300 MG capsule TAKE 2 CAPSULES BY MOUTH TWICE DAILY, MAY TAKE AN ADDTIONAL 2 CAPSULES MIDDAY AS NEEDED FOR PAIN 11/02/20  Yes [place],  [name], MD  insulin glargine (LANTUS SOLOSTAR) 100 UNIT/ML Solostar Pen Inject 42 Units into the skin at bedtime. 02/13/20  Yes [place], Marella Chimes, MD  lisinopril (ZESTRIL) 20 MG tablet TAKE 1 TABLET(20 MG) BY MOUTH DAILY 09/17/20  Yes [place], [name], MD  metoprolol succinate (TOPROL-XL) 50 MG 24 hr tablet TAKE 1 TABLET BY MOUTH EVERY DAY WITH OR IMMEDIATELY FOLLOWING A MEAL 08/25/20  Yes [place], [name], MD  nystatin (MYCOSTATIN/NYSTOP) powder Apply topically 3 (three) times daily. 07/24/19  Yes [place], [name], MD  oxyCODONE-acetaminophen (PERCOCET) 7.5-325 MG tablet Take 1 tablet by mouth every 4 (four) hours as needed. 09/15/20  Yes [place], [name], MD  pantoprazole (PROTONIX) 40 MG tablet TAKE 1 TABLET(40 MG) BY MOUTH DAILY 10/05/20  Yes [place], [name], MD  simvastatin (ZOCOR) 20 MG tablet TAKE 1 TABLET(20 MG) BY MOUTH AT BEDTIME 08/24/20  Yes [place], [name], MD  tamsulosin (FLOMAX) 0.4 MG CAPS capsule TAKE 1 CAPSULE(0.4 MG) BY MOUTH DAILY 10/02/20  Yes [place], [name], MD  tiZANidine (ZANAFLEX) 2 MG tablet TAKE 1 TABLET(2 MG) BY MOUTH TWICE DAILY AS NEEDED 05/29/20  Yes [place], [name], MD  Triamcinolone Acetonide (TRIAMCINOLONE 0.1 % CREAM : EUCERIN) CREA Apply 1 application topically 2 (two) times daily as needed. Patient taking differently: Apply 1 application topically 2 (two) times daily as needed for rash.  02/06/18  Yes [place], [name], MD  trimethoprim-polymyxin b (POLYTRIM) ophthalmic solution 1 drop four times a day to right eye for 5 days 09/25/20  Yes [place], [name], MD  iron polysaccharides (NIFEREX) 150 MG capsule Take 1 capsule (150 mg total) by mouth daily. 06/16/20   [place], [name], MD  Lancets [facility] DELICA PLUS [id number]) MISC USE TO TEST BLOOD SUGAR FOUR TIMES DAILY Patient taking differently: 1 Device by Other route in the morning, at noon, in the evening, and at bedtime.  02/27/20   Cassandria Anger, MD  lubiprostone (AMITIZA) 24 MCG capsule  Take 1 capsule (24 mcg total) by mouth 2 (two) times daily with a meal. 08/04/20   [place], [name], MD  [facility] VERIO test strip USE TO CHECK BLOOD SUGAR FOUR TIMES DAILY Patient taking differently: 1 each by Other route in the morning, at noon, in the evening, and at bedtime.  11/19/19   Cassandria Anger, MD    Family History Family History  Problem Relation Age of Onset  .  Heart failure Mother   . Heart failure Father   . Heart failure Sister   . Heart failure Son   . Colon cancer Neg Hx     Social History Social History   Tobacco Use  . Smoking status: Former Smoker    Packs/day: 3.00    Years: 25.00    Pack years: 75.00    Types: Cigarettes    Quit date: 03/01/1997    Years since quitting: 23.7  . Smokeless tobacco: Never Used  Vaping Use  . Vaping Use: Never used  Substance Use Topics  . Alcohol use: No  . Drug use: No     Allergies   Patient has no known allergies.   Review of Systems Review of Systems Pertinent negatives listed in HPI   Physical Exam Triage Vital Signs ED Triage Vitals  Enc Vitals Group     BP 11/16/20 1545 (!) 169/117     Pulse Rate 11/16/20 1545 98     Resp 11/16/20 1545 17     Temp 11/16/20 1545 98.6 F (37 C)     Temp Source 11/16/20 1545 Tympanic     SpO2 11/16/20 1545 96 %     Weight --      Height --      Head Circumference --      Peak Flow --      Pain Score 11/16/20 1616 9     Pain Loc --      Pain Edu? --      Excl. in [place]? --    No data found.  Updated Vital Signs BP (!) 169/117 (BP Location: Left Wrist)   Pulse 98   Temp 98.6 F (37 C) (Tympanic)   Resp 17   SpO2 96%   Visual Acuity Right Eye Distance:   Left Eye Distance:   Bilateral Distance:    Right Eye Near:   Left Eye Near:    Bilateral Near:     Physical Exam Constitutional:      Appearance: He is obese.     Comments: Chronically-ill appearing.  Cardiovascular:     Rate and Rhythm: Normal rate and regular rhythm.  Musculoskeletal:      Right shoulder: Swelling, deformity and tenderness present. Decreased strength.     Right upper arm: Swelling, deformity and tenderness present.     Right elbow: Deformity and laceration present. Decreased range of motion. Tenderness present in radial head, medial epicondyle, lateral epicondyle and olecranon process.     Right forearm: Swelling, tenderness and bony tenderness present.     Right wrist: Swelling and tenderness present. Decreased range of motion.     Right hand: Swelling and tenderness present.       Arms:     Cervical back: Normal range of motion.  Lymphadenopathy:     Cervical: No cervical adenopathy.  Skin:    Capillary Refill: Capillary refill takes less than 2 seconds.  Neurological:     Cranial Nerves: Cranial nerve deficit present.     Sensory: Sensory deficit present.     Motor: Weakness and abnormal muscle tone present.     Coordination: Coordination abnormal.     Gait: Gait abnormal.     Deep Tendon Reflexes: Reflexes abnormal.     Comments: Aphasia. Immobile, right sided hemiplegia,  Wheelchair-confined.  Psychiatric:        Mood and Affect: Mood normal.     UC Treatments / Results  Labs (all labs ordered are listed, but only  abnormal results are displayed) Labs Reviewed - No data to display  EKG   Radiology No results found.  Procedures Procedures (including critical care time)  Medications Ordered in UC Medications - No data to display  Initial Impression / Assessment and Plan / UC Course  I have reviewed the triage vital signs and the nursing notes.  Pertinent labs & imaging results that were available during my care of the patient were reviewed by me and considered in my medical decision making (see chart for details).    Unable to obtain images here in clinic today given patient immobility. Ordered outpatient images at [facility]. Shoulder immobilizer applied, continue use until fracture is ruled out  Maximize current  pain medication for maximum pain relief. Will notify of results and place referral as warranted   Final Clinical Impressions(s) / UC Diagnoses   Final diagnoses:  Injury of right shoulder, initial encounter  Elbow injury, right, initial encounter  Injury of right forearm, initial encounter  Injury of right wrist, initial encounter  Hand injury, right, initial encounter     Discharge Instructions     After CT of the Chest tomorrow at [facility], ask the technician to direct you to the x-ray department to have imaging studies completed. I will update you via MyChart of the results of xrays. Give pain medication prior to imaging tomorrow.    ED Prescriptions    None     PDMP not reviewed this encounter.   Scot Jun, FNP 11/18/20 1600

## 2020-11-16 NOTE — Telephone Encounter (Signed)
Noted  

## 2020-11-16 NOTE — ED Triage Notes (Addendum)
Was sitting on the side of bed.  Caregiver stepped away, heard a noise, returned to room to find patient on the floor.  Patient was found on floor, lying on right arm.  This occurred about 1 1/2 weeks ago.  Pain includes entire arm.  Patient takes oxycodone BID.  Patient is allowed an extra dose if needed and has taken one extra dose today.

## 2020-11-16 NOTE — Discharge Instructions (Addendum)
After CT of the Chest tomorrow at [facility], ask the technician to direct you to the x-ray department to have imaging studies completed. I will update you via MyChart of the results of xrays. Give pain medication prior to imaging tomorrow.

## 2020-11-17 ENCOUNTER — Other Ambulatory Visit: Payer: Self-pay | Admitting: Family Medicine

## 2020-11-17 ENCOUNTER — Ambulatory Visit (HOSPITAL_COMMUNITY)
Admission: RE | Admit: 2020-11-17 | Discharge: 2020-11-17 | Disposition: A | Discharge status: Home / Self Care | Payer: Medicare Other | Source: Ambulatory Visit | Attending: Family Medicine | Admitting: Family Medicine

## 2020-11-17 ENCOUNTER — Ambulatory Visit (HOSPITAL_COMMUNITY)
Admission: RE | Admit: 2020-11-17 | Discharge: 2020-11-17 | Disposition: A | Discharge status: Home / Self Care | Payer: Medicare Other | Source: Ambulatory Visit | Attending: Radiation Oncology | Admitting: Radiation Oncology

## 2020-11-17 DIAGNOSIS — C3412 Malignant neoplasm of upper lobe, left bronchus or lung: Secondary | ICD-10-CM | POA: Diagnosis not present

## 2020-11-17 DIAGNOSIS — C3490 Malignant neoplasm of unspecified part of unspecified bronchus or lung: Secondary | ICD-10-CM | POA: Diagnosis not present

## 2020-11-17 DIAGNOSIS — S42291A Other displaced fracture of upper end of right humerus, initial encounter for closed fracture: Secondary | ICD-10-CM | POA: Diagnosis not present

## 2020-11-17 DIAGNOSIS — M79641 Pain in right hand: Secondary | ICD-10-CM | POA: Diagnosis not present

## 2020-11-17 DIAGNOSIS — M7989 Other specified soft tissue disorders: Secondary | ICD-10-CM | POA: Diagnosis not present

## 2020-11-17 DIAGNOSIS — Z85118 Personal history of other malignant neoplasm of bronchus and lung: Secondary | ICD-10-CM | POA: Diagnosis not present

## 2020-11-17 DIAGNOSIS — Z8572 Personal history of non-Hodgkin lymphomas: Secondary | ICD-10-CM | POA: Diagnosis not present

## 2020-11-17 DIAGNOSIS — M79601 Pain in right arm: Secondary | ICD-10-CM | POA: Diagnosis not present

## 2020-11-17 NOTE — Progress Notes (Signed)
See encounter note from Urgent Care visit 11/16/20 and images dated 11/17/20

## 2020-11-18 ENCOUNTER — Other Ambulatory Visit: Payer: Self-pay | Admitting: Family Medicine

## 2020-11-18 ENCOUNTER — Other Ambulatory Visit: Payer: Self-pay | Admitting: *Deleted

## 2020-11-18 MED ORDER — OXYCODONE-ACETAMINOPHEN 7.5-325 MG PO TABS
1.0000 | ORAL_TABLET | ORAL | 0 refills | Status: DC | PRN
Start: 2020-11-18 — End: 2021-01-26

## 2020-11-18 NOTE — Telephone Encounter (Signed)
Received call from patient spouse, [name].   Requested refill on Oxycodone/APAP.   Ok to refill?? Last office visit 09/25/2020. Last refill 09/15/2020.  Also, patient wife requesting referral to ortho for fracture. Orders have been placed by UC for [facility]. Appointment scheduled for 11/25/2020. Patient is aware.

## 2020-11-19 ENCOUNTER — Ambulatory Visit: Payer: Self-pay | Admitting: Pharmacist

## 2020-11-19 NOTE — Chronic Care Management (AMB) (Signed)
Chronic Care Management   Follow Up Note   12/10/2020 Name: [name] MRN: [id number] DOB: 20-Aug-1946  Referred by: [name], MD Reason for referral : No chief complaint on file.   [name] is a 74 y.o. year old male who is a primary care patient of [place], [name], MD. The CCM team was consulted for assistance with chronic disease management and care coordination needs.    Review of patient status, including review of consultants reports, relevant laboratory and other test results, and collaboration with appropriate care team members and the patient's provider was performed as part of comprehensive patient evaluation and provision of chronic care management services.    SDOH (Social Determinants of Health) assessments performed: No See Care Plan activities for detailed interventions related to [facility])     Outpatient Encounter Medications as of 11/19/2020  Medication Sig Note  . allopurinol (ZYLOPRIM) 300 MG tablet TAKE 1 TABLET(300 MG) BY MOUTH DAILY   . amLODipine (NORVASC) 10 MG tablet TAKE 1 TABLET(10 MG) BY MOUTH DAILY   . cloNIDine (CATAPRES) 0.2 MG tablet Take 1 tablet (0.2 mg total) by mouth 2 (two) times daily. (Patient taking differently: Take 0.2 mg by mouth daily.)   . diclofenac sodium (VOLTAREN) 1 % GEL Apply twice a day for affected area on ankle as needed (Patient taking differently: Apply 4 g topically 2 (two) times daily as needed. Apply twice a day for affected area on ankle as needed)   . fluticasone (FLONASE) 50 MCG/ACT nasal spray Place 2 sprays into both nostrils daily.   [name] gabapentin (NEURONTIN) 300 MG capsule TAKE 2 CAPSULES BY MOUTH TWICE DAILY, MAY TAKE AN ADDTIONAL 2 CAPSULES MIDDAY AS NEEDED FOR PAIN   . insulin glargine (LANTUS SOLOSTAR) 100 UNIT/ML Solostar Pen Inject 42 Units into the skin at bedtime. 03/03/2020: Patient took 42 units at 2000 on 03/02/20  . iron polysaccharides (NIFEREX) 150 MG capsule Take 1 capsule (150 mg total) by  mouth daily.   . Lancets (ONETOUCH DELICA PLUS [id number]) MISC USE TO TEST BLOOD SUGAR FOUR TIMES DAILY (Patient taking differently: 1 Device by Other route in the morning, at noon, in the evening, and at bedtime.)   . lisinopril (ZESTRIL) 20 MG tablet TAKE 1 TABLET(20 MG) BY MOUTH DAILY   . lubiprostone (AMITIZA) 24 MCG capsule Take 1 capsule (24 mcg total) by mouth 2 (two) times daily with a meal.   . nystatin (MYCOSTATIN/NYSTOP) powder Apply topically 3 (three) times daily.   [name] oxyCODONE-acetaminophen (PERCOCET) 7.5-325 MG tablet Take 1 tablet by mouth every 4 (four) hours as needed.   . pantoprazole (PROTONIX) 40 MG tablet TAKE 1 TABLET(40 MG) BY MOUTH DAILY   . simvastatin (ZOCOR) 20 MG tablet TAKE 1 TABLET(20 MG) BY MOUTH AT BEDTIME   . tamsulosin (FLOMAX) 0.4 MG CAPS capsule TAKE 1 CAPSULE(0.4 MG) BY MOUTH DAILY   . tiZANidine (ZANAFLEX) 2 MG tablet TAKE 1 TABLET(2 MG) BY MOUTH TWICE DAILY AS NEEDED   . Triamcinolone Acetonide (TRIAMCINOLONE 0.1 % CREAM : EUCERIN) CREA Apply 1 application topically 2 (two) times daily as needed. (Patient taking differently: Apply 1 application topically 2 (two) times daily as needed for rash.)   . trimethoprim-polymyxin b (POLYTRIM) ophthalmic solution 1 drop four times a day to right eye for 5 days   . [DISCONTINUED] furosemide (LASIX) 40 MG tablet Take 1 tablet (40 mg total) by mouth daily.   . [DISCONTINUED] metoprolol succinate (TOPROL-XL) 50 MG 24 hr tablet TAKE  1 TABLET BY MOUTH EVERY DAY WITH OR IMMEDIATELY FOLLOWING A MEAL   . [DISCONTINUED] ONETOUCH VERIO test strip USE TO CHECK BLOOD SUGAR FOUR TIMES DAILY (Patient taking differently: 1 each by Other route in the morning, at noon, in the evening, and at bedtime. )    No facility-administered encounter medications on file as of 11/19/2020.     Reviewed chart prior to disease state call. Spoke with patient regarding DM.  Recent OV's 11/16/2020 (ED) - Injury to upper arm, ordered CT at [facility] long.   Patient confirmed to have fracture of right upper arm, right upper forearm.  No fracture in right wrist or hand.  Recent Relevant Labs: Lab Results  Component Value Date/Time   HGBA1C 6.2 (H) 09/02/2020 10:19 AM   HGBA1C 7.4 (H) 03/03/2020 08:15 AM   HGBA1C 6.7 12/03/2015 12:00 AM   MICROALBUR 123.0 05/12/2020 09:48 AM   MICROALBUR 232.3 05/04/2017 09:31 AM    Kidney Function Lab Results  Component Value Date/Time   CREATININE 1.58 (H) 09/02/2020 10:19 AM   CREATININE 1.70 (H) 08/19/2020 09:47 AM   CREATININE 1.77 (H) 06/10/2020 04:22 PM   GFRNONAA 35 (L) 05/12/2020 09:48 AM   GFRAA 41 (L) 05/12/2020 09:48 AM    . Current antihyperglycemic regimen:  o Lantus 100u/ml 42 units hs . What recent interventions/DTPs have been made to improve glycemic control:  o None . Have there been any recent hospitalizations or ED visits since last visit with CPP? Yes, patient fall resulted in ED visit 11/16/20. [name] Patient denies hypoglycemic symptoms, including Shaky, Nervous/irritable and Vision changes . Patient denies hyperglycemic symptoms, including blurry vision, excessive thirst and fatigue . How often are you checking your blood sugar? once daily . What are your blood sugars ranging?  o Fasting: no logs available, she reports some lower ones below 90  o  . During the week, how often does your blood glucose drop below 70? Never . Are you checking your feet daily/regularly? Yes  Adherence Review: Is the patient currently on a STATIN medication? Yes Is the patient currently on ACE/ARB medication? Yes Does the patient have >5 day gap between last estimated fill dates? No  Patient's wife is not currently writing down his blood sugars.  Due to his fall a few weeks ago and the fact that he is on insulin I have asked her to write down his fasting sugars for two weeks.  I will follow up in two weeks to see if he is having hypoglycemia.  I have instructed her to reach out to [place] if she sees  multiple readings in the 70s and 80s.  Also recommended he eats a snack before bed to prevent overnight hypoglycemia.  Follow-up: 2 weeks on blood sugar readings  Beverly Milch, PharmD Clinical Pharmacist [place] [phone]

## 2020-11-24 ENCOUNTER — Ambulatory Visit
Admission: RE | Admit: 2020-11-24 | Discharge: 2020-11-24 | Disposition: A | Discharge status: Home / Self Care | Payer: Medicare Other | Source: Ambulatory Visit | Attending: Radiation Oncology | Admitting: Radiation Oncology

## 2020-11-24 ENCOUNTER — Other Ambulatory Visit: Payer: Self-pay

## 2020-11-24 ENCOUNTER — Ambulatory Visit: Payer: Medicare Other | Admitting: Family Medicine

## 2020-11-24 ENCOUNTER — Inpatient Hospital Stay: Payer: Medicare Other | Attending: Radiation Oncology

## 2020-11-24 ENCOUNTER — Other Ambulatory Visit: Payer: Self-pay | Admitting: *Deleted

## 2020-11-24 VITALS — BP 136/91 | HR 88 | Temp 98.4°F | Resp 17

## 2020-11-24 DIAGNOSIS — Z23 Encounter for immunization: Secondary | ICD-10-CM | POA: Insufficient documentation

## 2020-11-24 DIAGNOSIS — Z85118 Personal history of other malignant neoplasm of bronchus and lung: Secondary | ICD-10-CM | POA: Diagnosis not present

## 2020-11-24 DIAGNOSIS — Z923 Personal history of irradiation: Secondary | ICD-10-CM | POA: Diagnosis not present

## 2020-11-24 DIAGNOSIS — Z79899 Other long term (current) drug therapy: Secondary | ICD-10-CM | POA: Insufficient documentation

## 2020-11-24 DIAGNOSIS — J439 Emphysema, unspecified: Secondary | ICD-10-CM | POA: Insufficient documentation

## 2020-11-24 DIAGNOSIS — I700 Atherosclerosis of aorta: Secondary | ICD-10-CM | POA: Diagnosis not present

## 2020-11-24 DIAGNOSIS — C3412 Malignant neoplasm of upper lobe, left bronchus or lung: Secondary | ICD-10-CM

## 2020-11-24 DIAGNOSIS — Z08 Encounter for follow-up examination after completed treatment for malignant neoplasm: Secondary | ICD-10-CM | POA: Diagnosis not present

## 2020-11-24 MED ORDER — FUROSEMIDE 40 MG PO TABS
40.0000 mg | ORAL_TABLET | Freq: Every day | ORAL | 2 refills | Status: DC
Start: 2020-11-24 — End: 2021-05-21

## 2020-11-24 NOTE — Progress Notes (Signed)
   Covid-19 Vaccination Clinic  Name:  [name]    MRN: [id number] DOB: Jun 26, 1946  11/24/2020  [name] was observed post Covid-19 immunization for 15 minutes without incident. He was provided with Vaccine Information Sheet and instruction to access the V-Safe system.   [name] was instructed to call 911 with any severe reactions post vaccine: [name] Difficulty breathing  . Swelling of face and throat  . A fast heartbeat  . A bad rash all over body  . Dizziness and weakness   Immunizations Administered    Name Date Dose VIS Date Route   Pfizer COVID-19 Vaccine 11/24/2020  3:23 PM 0.3 mL 09/30/2020 Intramuscular   Manufacturer: [place]   Lot: [id number]   [place]: [id number]

## 2020-11-24 NOTE — Progress Notes (Signed)
Patient presents today for follow up after completing radiation to the left lung on 05/30/2018 and to review CT scan results from 11/17/2020  Pain: Reports an 8 out of 10 pain to his right arm from a fracture above and below his elbow. Scheduled to see an orthopedic doctor tomorrow Fatigue: Patient denies any trouble sleeping during the night, or lingering fatigue during the day SOB: Patient denies any worsening symptoms Cough: Patient denies dry or congested cough Appetite: Both patient and family report a big decrease in appetite. Family state he wouldn't eat breakfast today, and other days he just eats very small portions Patient unable to stand for weight today Wt Readings from Last 3 Encounters:  08/14/20 250 lb (113.4 kg)  03/04/20 248 lb 3.8 oz (112.6 kg)  09/06/19 250 lb (113.4 kg)   Other issues of note: Nothing else of note  Vitals:   11/24/20 1414  BP: (!) 136/91  Pulse: 88  Resp: 17  Temp: 98.4 F (36.9 C)  SpO2: 99%

## 2020-11-25 ENCOUNTER — Encounter: Payer: Self-pay | Admitting: Radiation Oncology

## 2020-11-25 ENCOUNTER — Encounter: Payer: Self-pay | Admitting: Orthopedic Surgery

## 2020-11-25 ENCOUNTER — Ambulatory Visit: Payer: Medicare Other | Admitting: Orthopedic Surgery

## 2020-11-25 ENCOUNTER — Other Ambulatory Visit: Payer: Self-pay | Admitting: "Endocrinology

## 2020-11-25 VITALS — BP 140/94 | HR 82 | Ht 72.0 in

## 2020-11-25 DIAGNOSIS — W19XXXA Unspecified fall, initial encounter: Secondary | ICD-10-CM | POA: Diagnosis not present

## 2020-11-25 DIAGNOSIS — S42201A Unspecified fracture of upper end of right humerus, initial encounter for closed fracture: Secondary | ICD-10-CM

## 2020-11-25 NOTE — Progress Notes (Signed)
New Patient Visit  Assessment: [name] is a 74 y.o. male with the following: 1. Closed fracture of proximal end of right humerus, unspecified fracture morphology, initial encounter  Plan: [name] sustained a right proximal humerus fracture, valgus impacted.  Prior to the injury, he did not use his RUE secondary to a previous stroke.  He has multiple medical comorbidities, so he would be a high risk surgical candidate.  Plan to treat his injury in a sling, wear at all times for now.  Follow up in 3-4 weeks for repeat evaluation.   Follow-up: Return for 3-4 weeks.  XR R shoulder at next visit  Subjective:  Chief Complaint  Patient presents with  . Shoulder Pain    Right shoulder pain, some pain,     History of Present Illness: [name] is a 74 y.o. male who presents for evaluation of his right shoulder.  He has a history of stroke, and deficits on his right side.  He fell, approximately 2 weeks ago, onto his right side.  Initially, he was not having much discomfort, but after approximately 1 and half weeks, he presented for evaluation.  X-rays at that time demonstrated a proximal humerus fracture.  He was placed in a sling.  He continues to have some discomfort in his right shoulder, which is most likely related to spasm around the shoulder.  He is on chronic pain medications.  His family is concerned because his appetite has recently decreased.  No other injuries sustained during the fall.   Review of Systems: No fevers or chills No chest pain No shortness of breath No bowel or bladder dysfunction No GI distress No headaches   Medical History:  Past Medical History:  Diagnosis Date  . Arthritis   . Asthma    "hx of"  . Barrett's esophagus    EGD 03/23/2011 & EGD 2/09 bx proven  . BPH (benign prostatic hyperplasia)   . Cancer of parotid gland ([place]) 11/23/12   Adenocarcinoma  . Chronic abdominal pain   . Chronic constipation   . Colon polyp 03/23/2011    tubular adenoma, Dr. Gala Romney  . 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 ([place]) 07/15/2011  . CVA (cerebral infarction) 1998   right sided deficit  . Delayed gastric emptying 2018  . Diverticulosis    TCS 03/23/11 pancolonic diverticula &TCS 5/08, pancolonic diverticula  . DM (diabetes mellitus) ([place])   . Edema of lower extremity 12/21/12   bilateral   . Esotropia of left eye   . GERD (gastroesophageal reflux disease)   . Glaucoma (increased eye pressure)   . Gout   . Gout   . Hemorrhagic colitis 06/06/2012.  [name] Hemorrhoids, internal 03/23/2011   tcs by Dr. Gala Romney  . Hepatitis    esosiniphilic, tx with prednisone  . Hiatal hernia   . History of radiation therapy 05/21/18- 05/30/18   Left Lung/ 54 Gy delivered in 3 fractions of 18 Gy. SBRT  . HTN (hypertension)   . Hx of radiation therapy 1974   right base of skull area-lymphoma  . Hyperlipidemia   . Lower facial weakness    Right  . Lymphoma ([place]) 1974   [facility]RT at [facility], right base of skull area  . Neuropathy   . Non-small cell lung cancer ([place]) dx'd 04/26/18  . Peripheral edema    R>L legs  . Rash    chronic, recurrent, R>L legs  . Renal insufficiency   . Steatohepatitis  liver biopsy 2009  . Stroke [facility]) 1998   right hemiparesis/plegia    Past Surgical History:  Procedure Laterality Date  . BIOPSY  12/01/2016   Procedure: BIOPSY;  Surgeon: Daneil Dolin, MD;  Location: [facility] END[facility] SUITE;  Service: Endoscopy;;  duodenum, gastric, esophagus  . CHOLECYSTECTOMY    . COLONOSCOPY  03/23/11   Dr. Gala Romney  pancolonic diverticula, hemorrhoids, tubular adenoma.. next tcs 03/2016  . COLONOSCOPY WITH PROPOFOL N/A 12/01/2016   inadequate bowel prep precluded exam  . ESOPHAGOGASTRODUODENOSCOPY  02/05/08   goblet cell metaplasia/negative for H.pylori  . ESOPHAGOGASTRODUODENOSCOPY  03/23/11   Dr. Gala Romney, barretts, hiatal hernia  . ESOPHAGOGASTRODUODENOSCOPY (EGD) WITH PROPOFOL N/A 12/01/2016   Dr. Gala Romney: Large amount of retained gastric  contents precluded completion of the stomach. Mucosal changes were found in the stomach. Erosions and somewhat scalloped appearing mucosa present, reactive gastritis/no H pylori. Barrett's esophagus noted, no dysplasia on biopsy. Duodenal biopsies taken as well, benign, no evidence of eosinophilia.  . IR FLUORO GUIDED NEEDLE PLC ASPIRATION/INJECTION LOC  12/13/2018  . IR KYPHO LUMBAR INC FX REDUCE BONE BX UNI/BIL CANNULATION INC/IMAGING  12/13/2018  . IR RADIOLOGY PERIPHERAL GUIDED IV START  12/13/2018  . IR US GUIDE VASC ACCESS LEFT  12/13/2018  . MASS BIOPSY  11/01/2012   Procedure: NECK MASS BIOPSY;  Surgeon: Ascencion Dike, MD;  Location: [facility] ORS;  Service: ENT;  Laterality: Right;  Excisional Bx Right Neck Mass; attempted external jugular cutdown of left side  . PAROTIDECTOMY  11/24/2012   Procedure: PAROTIDECTOMY;  Surgeon: Ascencion Dike, MD;  Location: [place];  Service: ENT;  Laterality: N/A;  Total parotidectomy  . PLEURECTOMY    . right lymph node removal Right    behind right ear  . Right video-assisted thoracic surgery, pleurectomy, and pleurodesis  2011  . VIDEO BRONCHOSCOPY WITH ENDOBRONCHIAL NAVIGATION N/A 04/26/2018   Procedure: VIDEO BRONCHOSCOPY WITH ENDOBRONCHIAL NAVIGATION;  Surgeon: Melrose Nakayama, MD;  Location[facility];  Service: Thoracic;  Laterality: N/A;    Family History  Problem Relation Age of Onset  . Heart failure Mother   . Heart failure Father   . Heart failure Sister   . Heart failure Son   . Colon cancer Neg Hx    Social History   Tobacco Use  . Smoking status: Former Smoker    Packs/day: 3.00    Years: 25.00    Pack years: 75.00    Types: Cigarettes    Quit date: 03/01/1997    Years since quitting: 23.7  . Smokeless tobacco: Never Used  Vaping Use  . Vaping Use: Never used  Substance Use Topics  . Alcohol use: No  . Drug use: No    No Known Allergies  Current Meds  Medication Sig  . allopurinol (ZYLOPRIM) 300 MG tablet TAKE 1 TABLET(300 MG) BY MOUTH  DAILY  . amLODipine (NORVASC) 10 MG tablet TAKE 1 TABLET(10 MG) BY MOUTH DAILY  . cloNIDine (CATAPRES) 0.2 MG tablet Take 1 tablet (0.2 mg total) by mouth 2 (two) times daily. (Patient taking differently: Take 0.2 mg by mouth daily.)  . diclofenac sodium (VOLTAREN) 1 % GEL Apply twice a day for affected area on ankle as needed (Patient taking differently: Apply 4 g topically 2 (two) times daily as needed. Apply twice a day for affected area on ankle as needed)  . fluticasone (FLONASE) 50 MCG/ACT nasal spray Place 2 sprays into both nostrils daily.  . furosemide (LASIX) 40 MG tablet Take  1 tablet (40 mg total) by mouth daily. May give additional dose QD PRN for increased edema.  . gabapentin (NEURONTIN) 300 MG capsule TAKE 2 CAPSULES BY MOUTH TWICE DAILY, MAY TAKE AN ADDTIONAL 2 CAPSULES MIDDAY AS NEEDED FOR PAIN  . glucose blood (ONETOUCH VERIO) test strip Use as directed to check Blood Glucose twice daily  . insulin glargine (LANTUS SOLOSTAR) 100 UNIT/ML Solostar Pen Inject 42 Units into the skin at bedtime.  . iron polysaccharides (NIFEREX) 150 MG capsule Take 1 capsule (150 mg total) by mouth daily.  . Lancets (ONETOUCH DELICA PLUS [id number]) MISC USE TO TEST BLOOD SUGAR FOUR TIMES DAILY (Patient taking differently: 1 Device by Other route in the morning, at noon, in the evening, and at bedtime.)  . lisinopril (ZESTRIL) 20 MG tablet TAKE 1 TABLET(20 MG) BY MOUTH DAILY  . lubiprostone (AMITIZA) 24 MCG capsule Take 1 capsule (24 mcg total) by mouth 2 (two) times daily with a meal.  . metoprolol succinate (TOPROL-XL) 50 MG 24 hr tablet TAKE 1 TABLET BY MOUTH EVERY DAY WITH OR IMMEDIATELY FOLLOWING A MEAL  . nystatin (MYCOSTATIN/NYSTOP) powder Apply topically 3 (three) times daily.  [name] oxyCODONE-acetaminophen (PERCOCET) 7.5-325 MG tablet Take 1 tablet by mouth every 4 (four) hours as needed.  . pantoprazole (PROTONIX) 40 MG tablet TAKE 1 TABLET(40 MG) BY MOUTH DAILY  . simvastatin (ZOCOR) 20 MG tablet  TAKE 1 TABLET(20 MG) BY MOUTH AT BEDTIME  . tamsulosin (FLOMAX) 0.4 MG CAPS capsule TAKE 1 CAPSULE(0.4 MG) BY MOUTH DAILY  . tiZANidine (ZANAFLEX) 2 MG tablet TAKE 1 TABLET(2 MG) BY MOUTH TWICE DAILY AS NEEDED  . Triamcinolone Acetonide (TRIAMCINOLONE 0.1 % CREAM : EUCERIN) CREA Apply 1 application topically 2 (two) times daily as needed. (Patient taking differently: Apply 1 application topically 2 (two) times daily as needed for rash.)  . trimethoprim-polymyxin b (POLYTRIM) ophthalmic solution 1 drop four times a day to right eye for 5 days    Objective: BP (!) 140/94   Pulse 82   Ht 6' (1.829 m)   BMI 33.91 kg/m   Physical Exam:  General: Seated in a wheelchair.  Response to questioning.  Not always able to articulate.  Right arm is in a sling.  He has limited motion in his fingers.  Decreased sensation throughout the right upper extremity.  Some tenderness around the shoulder, primarily medial to the shoulder joint itself.  Range of motion was deferred.  Fingers are warm and well perfused.    IMAGING: I personally reviewed images previously obtained from the ED   X-rays of the right shoulder demonstrate a valgus impacted right proximal humerus fracture in overall acceptable alignment.   New Medications:  No orders of the defined types were placed in this encounter.     Mordecai Rasmussen, MD  11/25/2020 12:06 PM

## 2020-11-25 NOTE — Progress Notes (Signed)
Radiation Oncology         ([phone]) [phone] ________________________________  Name: [name] MRN: [id number]  Date: 11/24/2020  DOB: [birth year]  Follow-Up outpatient note in person  CC: [place], [name], MD  [name], MD  Diagnosis and Prior Radiotherapy:    ICD-10-CM   1. Malignant neoplasm of upper lobe of left lung ([place])  C34.12      Non small cell lung cancer - adenocarcinoma Cancer Staging Malignant neoplasm of upper lobe of left lung ([place]) Staging form: Lung, AJCC 8th Edition - Clinical: Stage IB (cT2a, cN0, cM0) - Signed by [name], MD on 04/06/2018  05/21/2018 - 05/30/2018: Lung, Left/ 54 Gy delivered in 3 fractions of 18 Gy  CHIEF COMPLAINT:  lung cancer  Narrative: He is feeling relatively well without any new respiratory symptoms.   He is here with his wife and another person today.    He is due for his COVID booster.  Pain: Reports an 8 out of 10 pain to his right arm from a fracture above and below his elbow. Scheduled to see an orthopedic doctor tomorrow Fatigue: Patient denies any trouble sleeping during the night, or lingering fatigue during the day SOB: Patient denies any worsening symptoms Cough: Patient denies dry or congested cough Appetite: Both patient and family report a big decrease in appetite. Family state he wouldn't eat breakfast today, and other days he just eats very small portions; weight is stable.  Wt Readings from Last 3 Encounters:  08/14/20 250 lb (113.4 kg)  03/04/20 248 lb 3.8 oz (112.6 kg)  09/06/19 250 lb (113.4 kg)     ALLERGIES:  has No Known Allergies.  Meds: Current Outpatient Medications  Medication Sig Dispense Refill  . allopurinol (ZYLOPRIM) 300 MG tablet TAKE 1 TABLET(300 MG) BY MOUTH DAILY 90 tablet 1  . amLODipine (NORVASC) 10 MG tablet TAKE 1 TABLET(10 MG) BY MOUTH DAILY 90 tablet 1  . cloNIDine (CATAPRES) 0.2 MG tablet Take 1 tablet (0.2 mg total) by mouth 2 (two) times daily. (Patient taking  differently: Take 0.2 mg by mouth daily.) 270 tablet 2  . diclofenac sodium (VOLTAREN) 1 % GEL Apply twice a day for affected area on ankle as needed (Patient taking differently: Apply 4 g topically 2 (two) times daily as needed. Apply twice a day for affected area on ankle as needed) 100 g 1  . fluticasone (FLONASE) 50 MCG/ACT nasal spray Place 2 sprays into both nostrils daily. 16 g 2  . furosemide (LASIX) 40 MG tablet Take 1 tablet (40 mg total) by mouth daily. May give additional dose QD PRN for increased edema. 90 tablet 2  . gabapentin (NEURONTIN) 300 MG capsule TAKE 2 CAPSULES BY MOUTH TWICE DAILY, MAY TAKE AN ADDTIONAL 2 CAPSULES MIDDAY AS NEEDED FOR PAIN 540 capsule 3  . glucose blood (ONETOUCH VERIO) test strip Use as directed to check Blood Glucose twice daily 150 strip 5  . insulin glargine (LANTUS SOLOSTAR) 100 UNIT/ML Solostar Pen Inject 42 Units into the skin at bedtime. 15 mL 2  . iron polysaccharides (NIFEREX) 150 MG capsule Take 1 capsule (150 mg total) by mouth daily. 90 capsule 3  . Lancets (ONETOUCH DELICA PLUS [id number]) MISC USE TO TEST BLOOD SUGAR FOUR TIMES DAILY (Patient taking differently: 1 Device by Other route in the morning, at noon, in the evening, and at bedtime.) 200 each 2  . lisinopril (ZESTRIL) 20 MG tablet TAKE 1 TABLET(20 MG) BY MOUTH DAILY 90 tablet 2  .  lubiprostone (AMITIZA) 24 MCG capsule Take 1 capsule (24 mcg total) by mouth 2 (two) times daily with a meal. 90 capsule 1  . metoprolol succinate (TOPROL-XL) 50 MG 24 hr tablet TAKE 1 TABLET BY MOUTH EVERY DAY WITH OR IMMEDIATELY FOLLOWING A MEAL 90 tablet 0  . nystatin (MYCOSTATIN/NYSTOP) powder Apply topically 3 (three) times daily. 60 g 2  . oxyCODONE-acetaminophen (PERCOCET) 7.5-325 MG tablet Take 1 tablet by mouth every 4 (four) hours as needed. 180 tablet 0  . pantoprazole (PROTONIX) 40 MG tablet TAKE 1 TABLET(40 MG) BY MOUTH DAILY 90 tablet 2  . simvastatin (ZOCOR) 20 MG tablet TAKE 1 TABLET(20 MG) BY  MOUTH AT BEDTIME 90 tablet 2  . tamsulosin (FLOMAX) 0.4 MG CAPS capsule TAKE 1 CAPSULE(0.4 MG) BY MOUTH DAILY 90 capsule 2  . tiZANidine (ZANAFLEX) 2 MG tablet TAKE 1 TABLET(2 MG) BY MOUTH TWICE DAILY AS NEEDED 180 tablet 2  . Triamcinolone Acetonide (TRIAMCINOLONE 0.1 % CREAM : EUCERIN) CREA Apply 1 application topically 2 (two) times daily as needed. (Patient taking differently: Apply 1 application topically 2 (two) times daily as needed for rash.) 1 each 2  . trimethoprim-polymyxin b (POLYTRIM) ophthalmic solution 1 drop four times a day to right eye for 5 days 10 mL 0   No current facility-administered medications for this encounter.    Physical Findings:    oral temperature is 98.4 F (36.9 C). His blood pressure is 136/91 (abnormal) and his pulse is 88. His respiration is 17 and oxygen saturation is 99%.      General: he is in no acute distress sitting in a wheelchair Lungs-decreased breath sounds bilaterally but no rhonchi wheezes or rales Heart- regular in rate and rhythm with a murmur most audible at the aortic valve region. Pitting edema in the bilateral lower extremities  Lab Findings: Lab Results  Component Value Date   WBC 8.5 09/02/2020   HGB 12.0 (L) 09/02/2020   HCT 40.5 09/02/2020   MCV 73.1 (L) 09/02/2020   PLT 362 09/02/2020    Radiographic Findings: DG Shoulder Right  Result Date: 11/17/2020 CLINICAL DATA:  Diffuse right arm pain after a fall from bed 1.5 weeks ago. EXAM: RIGHT SHOULDER - 2+ VIEW COMPARISON:  None. FINDINGS: There is an impacted, mildly comminuted fracture of the humeral neck. There is inferior subluxation of the humeral head relative to the glenoid without dislocation. Degenerative changes are noted at the shoulder including glenohumeral osteophytosis and subchondral sclerosis. Postsurgical changes are noted in the right lung. IMPRESSION: Impacted right humeral neck fracture. Electronically Signed   By: Logan Bores M.D.   On: 11/17/2020 10:56    DG ELBOW COMPLETE RIGHT (3+VIEW)  Result Date: 11/17/2020 CLINICAL DATA:  Diffuse right arm pain after a fall from bed 1.5 weeks ago. History of lung cancer and lymphoma. EXAM: RIGHT ELBOW - COMPLETE 3+ VIEW COMPARISON:  None. FINDINGS: No acute fracture, dislocation, or elbow joint effusion is identified. There is a 1.6 cm lytic lesion in the proximal shaft of the radius without evidence of cortical disruption or periosteal reaction. Soft tissue swelling is noted about the elbow. IMPRESSION: 1. No acute osseous abnormality identified. 2. 1.6 cm lytic lesion in the proximal radius, indeterminate however metastatic disease and multiple myeloma are possible. Electronically Signed   By: Logan Bores M.D.   On: 11/17/2020 10:54   DG Wrist Complete Right  Result Date: 11/17/2020 CLINICAL DATA:  Diffuse right arm pain after a fall from bed 1.5 weeks ago. EXAM:  RIGHT WRIST - COMPLETE 3+ VIEW COMPARISON:  None. FINDINGS: No acute fracture or dislocation is identified. The bones are diffusely osteopenic. Arthropathy in the hand is reported separately. There is diffuse soft tissue swelling about the wrist and included distal forearm. IMPRESSION: Soft tissue swelling without acute osseous abnormality identified. Electronically Signed   By: Logan Bores M.D.   On: 11/17/2020 10:57   CT Chest Wo Contrast  Result Date: 11/18/2020 CLINICAL DATA:  Non-small cell lung cancer. EXAM: CT CHEST WITHOUT CONTRAST TECHNIQUE: Multidetector CT imaging of the chest was performed following the standard protocol without IV contrast. COMPARISON:  CT 05/18/2020 FINDINGS: Cardiovascular: Coronary artery calcification and aortic atherosclerotic calcification. Mediastinum/Nodes: No axillary or supraclavicular adenopathy. No mediastinal or hilar adenopathy. No pericardial fluid. Esophagus normal. Lungs/Pleura: Angular band of pleuroparenchymal thickening in the LEFT lung apex measuring 5.3 x 2.2 cm is unchanged from 6.2 x 1.9 cm. No new  or suspicious nodularity within the lungs. Paraseptal emphysema again noted in the upper lobes. Surgical scarring in the RIGHT upper lobe.  No nodularity. Mild bronchiectasis in lower lobes. Upper Abdomen: Limited view of the liver, kidneys, pancreas are unremarkable. Normal adrenal glands. Thickening of the adrenal glands is unchanged favored hyperplasia. Musculoskeletal: No acute osseous abnormality. IMPRESSION: 1. Stable bandlike pleuroparenchymal thickening in the LEFT lung apex. 2. Stable postsurgical change in the RIGHT upper lobe. 3. No evidence of lung cancer recurrence. 4. Aortic Atherosclerosis (ICD10-I70.0) and Emphysema (ICD10-J43.9). Electronically Signed   By: Suzy Bouchard M.D.   On: 11/18/2020 09:18   DG Humerus Right  Result Date: 11/17/2020 CLINICAL DATA:  Diffuse right arm pain after a fall from bed 1.5 weeks ago. EXAM: RIGHT HUMERUS - 2+ VIEW COMPARISON:  None. FINDINGS: There is an acute impacted fracture of the humeral neck without significant displacement. There is inferior subluxation of the humeral head relative to the glenoid without dislocation. No destructive osseous lesion is identified. The distal humerus is intact. There is diffuse soft tissue swelling. IMPRESSION: Impacted right humeral neck fracture. Electronically Signed   By: Logan Bores M.D.   On: 11/17/2020 10:47   DG Hand Complete Right  Result Date: 11/17/2020 CLINICAL DATA:  Diffuse right arm pain after a fall from bed 1.5 weeks ago. History of gout. EXAM: RIGHT HAND - COMPLETE 3+ VIEW COMPARISON:  None. FINDINGS: No acute fracture or dislocation is identified. There are moderate-sized, well-defined marginal and juxta-articular osseous erosions involving the first through third metacarpal heads and bases of the corresponding proximal phalanges, most severe at the second MCP joint. The bones are diffusely osteopenic. The soft tissues are unremarkable. IMPRESSION: 1. No acute osseous abnormality identified. 2.  Erosive arthropathy involving the first through third MCP joints consistent with the history of gout. Electronically Signed   By: Logan Bores M.D.   On: 11/17/2020 10:44    Impression/Plan:  NSCLC, in remission after radiation treatment  I reviewed the recent chest CT scan personally today. No evidence of recurrence was seen.  Plan: Follow up in 9 months with a repeat Chest CT at that time.  It is cumbersome for the patient to come in for appointments and scan so we are stretching out the interval to 9 months.  We discussed measures to reduce the risk of infection during the COVID-19 pandemic.  He is due for his booster vaccine.  He is eligible for [employer] vaccine.  He would like to receive this.  We will administer this to him in our clinic today.  On date of service, in total, I spent 20 minutes on this encounter.  The patient was seen in person.   _____________________________________   [name], MD

## 2020-11-30 ENCOUNTER — Telehealth: Payer: Self-pay | Admitting: *Deleted

## 2020-11-30 NOTE — Telephone Encounter (Signed)
Received call from patient wife [name].   Reports that patient has received [employer]. Advised to bring summons to office and medical exemption could be written.   Verbalized understanding.

## 2020-11-30 NOTE — Telephone Encounter (Signed)
Agree with write out of jury duty due to medical problems

## 2020-12-02 ENCOUNTER — Encounter: Payer: Self-pay | Admitting: Family Medicine

## 2020-12-07 NOTE — Telephone Encounter (Signed)
Received completed letter.   Call placed to patient wife [name] and made aware to pick up letter at front office.

## 2020-12-13 ENCOUNTER — Other Ambulatory Visit: Payer: Self-pay | Admitting: Family Medicine

## 2020-12-16 DIAGNOSIS — Z794 Long term (current) use of insulin: Secondary | ICD-10-CM | POA: Diagnosis not present

## 2020-12-16 DIAGNOSIS — E039 Hypothyroidism, unspecified: Secondary | ICD-10-CM | POA: Diagnosis not present

## 2020-12-16 DIAGNOSIS — E1121 Type 2 diabetes mellitus with diabetic nephropathy: Secondary | ICD-10-CM | POA: Diagnosis not present

## 2020-12-16 DIAGNOSIS — N1832 Chronic kidney disease, stage 3b: Secondary | ICD-10-CM | POA: Diagnosis not present

## 2020-12-17 LAB — COMPREHENSIVE METABOLIC PANEL
ALT: 10 IU/L (ref 0–44)
AST: 13 IU/L (ref 0–40)
Albumin/Globulin Ratio: 1.2 (ref 1.2–2.2)
Albumin: 3.7 g/dL (ref 3.7–4.7)
Alkaline Phosphatase: 152 IU/L — ABNORMAL HIGH (ref 44–121)
BUN/Creatinine Ratio: 14 (ref 10–24)
BUN: 28 mg/dL — ABNORMAL HIGH (ref 8–27)
Bilirubin Total: 0.2 mg/dL (ref 0.0–1.2)
CO2: 20 mmol/L (ref 20–29)
Calcium: 10.7 mg/dL — ABNORMAL HIGH (ref 8.6–10.2)
Chloride: 105 mmol/L (ref 96–106)
Creatinine, Ser: 1.99 mg/dL — ABNORMAL HIGH (ref 0.76–1.27)
GFR calc Af Amer: 37 mL/min/{1.73_m2} — ABNORMAL LOW (ref >59)
GFR calc non Af Amer: 32 mL/min/{1.73_m2} — ABNORMAL LOW (ref >59)
Globulin, Total: 3.1 g/dL (ref 1.5–4.5)
Glucose: 105 mg/dL — ABNORMAL HIGH (ref 65–99)
Potassium: 4.3 mmol/L (ref 3.5–5.2)
Sodium: 143 mmol/L (ref 134–144)
Total Protein: 6.8 g/dL (ref 6.0–8.5)

## 2020-12-17 LAB — PTH, INTACT AND CALCIUM: PTH: 139 pg/mL — ABNORMAL HIGH (ref 15–65)

## 2020-12-17 LAB — HEMOGLOBIN A1C
Est. average glucose Bld gHb Est-mCnc: 151 mg/dL
Hgb A1c MFr Bld: 6.9 % — ABNORMAL HIGH (ref 4.8–5.6)

## 2020-12-17 LAB — MICROALBUMIN / CREATININE URINE RATIO
Creatinine, Urine: 117.3 mg/dL
Microalb/Creat Ratio: 1805 mg/g creat — ABNORMAL HIGH (ref 0–29)
Microalbumin, Urine: 2117.4 ug/mL

## 2020-12-17 LAB — TSH: TSH: 2.92 u[IU]/mL (ref 0.450–4.500)

## 2020-12-17 LAB — T4, FREE: Free T4: 1.27 ng/dL (ref 0.82–1.77)

## 2020-12-21 ENCOUNTER — Telehealth (INDEPENDENT_AMBULATORY_CARE_PROVIDER_SITE_OTHER): Payer: Medicare Other | Admitting: Nurse Practitioner

## 2020-12-21 ENCOUNTER — Encounter: Payer: Self-pay | Admitting: Nurse Practitioner

## 2020-12-21 ENCOUNTER — Other Ambulatory Visit: Payer: Self-pay

## 2020-12-21 VITALS — BP 132/95 | HR 104

## 2020-12-21 DIAGNOSIS — N1832 Chronic kidney disease, stage 3b: Secondary | ICD-10-CM

## 2020-12-21 DIAGNOSIS — E559 Vitamin D deficiency, unspecified: Secondary | ICD-10-CM

## 2020-12-21 DIAGNOSIS — I10 Essential (primary) hypertension: Secondary | ICD-10-CM | POA: Diagnosis not present

## 2020-12-21 DIAGNOSIS — E039 Hypothyroidism, unspecified: Secondary | ICD-10-CM | POA: Diagnosis not present

## 2020-12-21 DIAGNOSIS — E1122 Type 2 diabetes mellitus with diabetic chronic kidney disease: Secondary | ICD-10-CM

## 2020-12-21 DIAGNOSIS — Z794 Long term (current) use of insulin: Secondary | ICD-10-CM

## 2020-12-21 DIAGNOSIS — E782 Mixed hyperlipidemia: Secondary | ICD-10-CM | POA: Diagnosis not present

## 2020-12-21 MED ORDER — LANTUS SOLOSTAR 100 UNIT/ML ~~LOC~~ SOPN: 36.0000 [IU] | PEN_INJECTOR | Freq: Every day | SUBCUTANEOUS | 2 refills | Status: DC

## 2020-12-21 NOTE — Progress Notes (Signed)
12/21/2020  Endocrinology follow-up note  TELEHEALTH VISIT: The patient is being engaged in telehealth visit due to COVID-19.  This type of visit limits physical examination significantly, and thus is not preferable over face-to-face encounters.  I connected with  [name] on 12/21/20 by a video enabled telemedicine application and verified that I am speaking with the correct person using two identifiers.   I discussed the limitations of evaluation and management by telemedicine. The patient expressed understanding and agreed to proceed.    The participants involved in this visit include: Jason Romp, NP located at [facility] and [name] and his wife, [name], located at their personal residence listed.   Subjective:    Patient ID: [name], male    DOB: Nov 19, 1946,    Past Medical History:  Diagnosis Date  . Arthritis   . Asthma    "hx of"  . Barrett's esophagus    EGD 03/23/2011 & EGD 2/09 bx proven  . BPH (benign prostatic hyperplasia)   . Cancer of parotid gland ([place]) 11/23/12   Adenocarcinoma  . Chronic abdominal pain   . Chronic constipation   . Colon polyp 03/23/2011   tubular adenoma, Dr. Gala Romney  . 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 ([place]) 07/15/2011  . CVA (cerebral infarction) 1998   right sided deficit  . Delayed gastric emptying 2018  . Diverticulosis    TCS 03/23/11 pancolonic diverticula &TCS 5/08, pancolonic diverticula  . DM (diabetes mellitus) ([place])   . Edema of lower extremity 12/21/12   bilateral   . Esotropia of left eye   . GERD (gastroesophageal reflux disease)   . Glaucoma (increased eye pressure)   . Gout   . Gout   . Hemorrhagic colitis 06/06/2012.  [name] Hemorrhoids, internal 03/23/2011   tcs by Dr. Gala Romney  . Hepatitis    esosiniphilic, tx with prednisone  . Hiatal hernia   . History of radiation therapy 05/21/18- 05/30/18   Left Lung/ 54 Gy delivered in 3 fractions of 18 Gy. SBRT   . HTN (hypertension)   . Hx of radiation therapy 1974   right base of skull area-lymphoma  . Hyperlipidemia   . Lower facial weakness    Right  . Lymphoma ([place]) 1974   [facility] at [facility], right base of skull area  . Neuropathy   . Non-small cell lung cancer ([place]) dx'd 04/26/18  . Peripheral edema    R>L legs  . Rash    chronic, recurrent, R>L legs  . Renal insufficiency   . Steatohepatitis    liver biopsy 2009  . Stroke [facility]) 1998   right hemiparesis/plegia   Past Surgical History:  Procedure Laterality Date  . BIOPSY  12/01/2016   Procedure: BIOPSY;  Surgeon: Daneil Dolin, MD;  Location: [facility];  Service: Endoscopy;;  duodenum, gastric, esophagus  . CHOLECYSTECTOMY    . COLONOSCOPY  03/23/11   Dr. Gala Romney  pancolonic diverticula, hemorrhoids, tubular adenoma.. next tcs 03/2016  . COLONOSCOPY WITH PROPOFOL N/A 12/01/2016   inadequate bowel prep precluded exam  . ESOPHAGOGASTRODUODENOSCOPY  02/05/08   goblet cell metaplasia/negative for H.pylori  . ESOPHAGOGASTRODUODENOSCOPY  03/23/11   Dr. Gala Romney, barretts, hiatal hernia  . ESOPHAGOGASTRODUODENOSCOPY (EGD) WITH PROPOFOL N/A 12/01/2016   Dr. Gala Romney: Large amount of retained gastric contents precluded completion of the stomach. Mucosal changes were found in the stomach. Erosions and somewhat scalloped appearing mucosa present, reactive gastritis/no H pylori. Barrett's esophagus  noted, no dysplasia on biopsy. Duodenal biopsies taken as well, benign, no evidence of eosinophilia.  . IR FLUORO GUIDED NEEDLE PLC ASPIRATION/INJECTION LOC  12/13/2018  . IR KYPHO LUMBAR INC FX REDUCE BONE BX UNI/BIL CANNULATION INC/IMAGING  12/13/2018  . IR RADIOLOGY PERIPHERAL GUIDED IV START  12/13/2018  . IR US GUIDE VASC ACCESS LEFT  12/13/2018  . MASS BIOPSY  11/01/2012   Procedure: NECK MASS BIOPSY;  Surgeon: Ascencion Dike, MD;  Location: [facility] ORS;  Service: ENT;  Laterality: Right;  Excisional Bx Right Neck Mass; attempted external jugular cutdown of left side  .  PAROTIDECTOMY  11/24/2012   Procedure: PAROTIDECTOMY;  Surgeon: Ascencion Dike, MD;  Location: [place];  Service: ENT;  Laterality: N/A;  Total parotidectomy  . PLEURECTOMY    . right lymph node removal Right    behind right ear  . Right video-assisted thoracic surgery, pleurectomy, and pleurodesis  2011  . VIDEO BRONCHOSCOPY WITH ENDOBRONCHIAL NAVIGATION N/A 04/26/2018   Procedure: VIDEO BRONCHOSCOPY WITH ENDOBRONCHIAL NAVIGATION;  Surgeon: Melrose Nakayama, MD;  Location: [place];  Service: Thoracic;  Laterality: N/A;   Social History   Socioeconomic History  . Marital status: Married    Spouse name: Not on file  . Number of children: 1  . Years of education: Not on file  . Highest education level: Not on file  Occupational History  . Occupation: disabled  Tobacco Use  . Smoking status: Former Smoker    Packs/day: 3.00    Years: 25.00    Pack years: 75.00    Types: Cigarettes    Quit date: 03/01/1997    Years since quitting: 23.8  . Smokeless tobacco: Never Used  Vaping Use  . Vaping Use: Never used  Substance and Sexual Activity  . Alcohol use: No  . Drug use: No  . Sexual activity: Not Currently  Other Topics Concern  . Not on file  Social History Narrative  . Not on file   Social Determinants of Health   Financial Resource Strain: Low Risk   . Difficulty of Paying Living Expenses: Not very hard  Food Insecurity: Not on file  Transportation Needs: Not on file  Physical Activity: Not on file  Stress: Not on file  Social Connections: Not on file   Outpatient Encounter Medications as of 12/21/2020  Medication Sig  . allopurinol (ZYLOPRIM) 300 MG tablet TAKE 1 TABLET(300 MG) BY MOUTH DAILY  . amLODipine (NORVASC) 10 MG tablet TAKE 1 TABLET(10 MG) BY MOUTH DAILY  . cloNIDine (CATAPRES) 0.2 MG tablet Take 1 tablet (0.2 mg total) by mouth 2 (two) times daily. (Patient taking differently: Take 0.2 mg by mouth daily.)  . diclofenac sodium (VOLTAREN) 1 % GEL Apply twice a  day for affected area on ankle as needed (Patient taking differently: Apply 4 g topically 2 (two) times daily as needed. Apply twice a day for affected area on ankle as needed)  . fluticasone (FLONASE) 50 MCG/ACT nasal spray Place 2 sprays into both nostrils daily.  . furosemide (LASIX) 40 MG tablet Take 1 tablet (40 mg total) by mouth daily. May give additional dose QD PRN for increased edema.  . gabapentin (NEURONTIN) 300 MG capsule TAKE 2 CAPSULES BY MOUTH TWICE DAILY, MAY TAKE AN ADDTIONAL 2 CAPSULES MIDDAY AS NEEDED FOR PAIN  . glucose blood (ONETOUCH VERIO) test strip Use as directed to check Blood Glucose twice daily  . iron polysaccharides (NIFEREX) 150 MG capsule Take 1 capsule (150 mg total)  by mouth daily.  . Lancets (ONETOUCH DELICA PLUS [id number]) MISC USE TO TEST BLOOD SUGAR FOUR TIMES DAILY (Patient taking differently: 1 Device by Other route in the morning, at noon, in the evening, and at bedtime.)  . lisinopril (ZESTRIL) 20 MG tablet TAKE 1 TABLET(20 MG) BY MOUTH DAILY  . lubiprostone (AMITIZA) 24 MCG capsule TAKE 1 CAPSULE(24 MCG) BY MOUTH TWICE DAILY WITH A MEAL  . metoprolol succinate (TOPROL-XL) 50 MG 24 hr tablet TAKE 1 TABLET BY MOUTH EVERY DAY WITH OR IMMEDIATELY FOLLOWING A MEAL  . nystatin (MYCOSTATIN/NYSTOP) powder Apply topically 3 (three) times daily.  [name] oxyCODONE-acetaminophen (PERCOCET) 7.5-325 MG tablet Take 1 tablet by mouth every 4 (four) hours as needed.  . pantoprazole (PROTONIX) 40 MG tablet TAKE 1 TABLET(40 MG) BY MOUTH DAILY  . simvastatin (ZOCOR) 20 MG tablet TAKE 1 TABLET(20 MG) BY MOUTH AT BEDTIME  . tamsulosin (FLOMAX) 0.4 MG CAPS capsule TAKE 1 CAPSULE(0.4 MG) BY MOUTH DAILY  . tiZANidine (ZANAFLEX) 2 MG tablet TAKE 1 TABLET(2 MG) BY MOUTH TWICE DAILY AS NEEDED  . Triamcinolone Acetonide (TRIAMCINOLONE 0.1 % CREAM : EUCERIN) CREA Apply 1 application topically 2 (two) times daily as needed. (Patient taking differently: Apply 1 application topically 2 (two)  times daily as needed for rash.)  . [DISCONTINUED] insulin glargine (LANTUS SOLOSTAR) 100 UNIT/ML Solostar Pen Inject 42 Units into the skin at bedtime.  . insulin glargine (LANTUS SOLOSTAR) 100 UNIT/ML Solostar Pen Inject 36 Units into the skin at bedtime.  . [DISCONTINUED] trimethoprim-polymyxin b (POLYTRIM) ophthalmic solution 1 drop four times a day to right eye for 5 days   No facility-administered encounter medications on file as of 12/21/2020.   ALLERGIES: No Known Allergies VACCINATION STATUS: Immunization History  Administered Date(s) Administered  . Fluad Quad(high Dose 65+) 09/06/2019, 09/02/2020  . Influenza Split 08/24/2012  . Influenza, High Dose Seasonal PF 10/31/2017, 10/02/2018  . Influenza,inj,Quad PF,6+ Mos 10/09/2013, 09/05/2014, 11/23/2015, 08/16/2016  . Moderna Sars-Covid-2 Vaccination 01/19/2020, 02/19/2020  . PFIZER SARS-COV-2 Vaccination 11/24/2020  . Pneumococcal Conjugate-13 01/07/2014  . Pneumococcal Polysaccharide-23 11/25/2012  . Tdap 09/18/2019    Diabetes He presents for his follow-up diabetic visit. He has type 2 diabetes mellitus. Onset time: diagnosed approx at age 36. His disease course has been stable. There are no hypoglycemic associated symptoms. Pertinent negatives for hypoglycemia include no confusion, headaches, nervousness/anxiousness, pallor or tremors. Associated symptoms include weakness. Pertinent negatives for diabetes include no chest pain, no fatigue, no polydipsia, no polyphagia, no polyuria and no weight loss. There are no hypoglycemic complications. Symptoms are stable. Diabetic complications include a CVA and nephropathy. Risk factors for coronary artery disease include diabetes mellitus, dyslipidemia, hypertension, male sex, obesity, sedentary lifestyle and tobacco exposure. Current diabetic treatment includes insulin injections. He is compliant with treatment most of the time. His weight is fluctuating minimally. He is following a  generally healthy diet. When asked about meal planning, he reported none. He has had a previous visit with a dietitian. He never participates in exercise. His home blood glucose trend is fluctuating minimally. His breakfast blood glucose range is generally 90-110 mg/dl. (His wife, the patients primary caregiver, presents today for virtual visit with logs showing tight fasting glycemic profile for his age and comorbidities.  His previsit A1c was 6.9% increasing from 6.2% last visit.  His most recent glucose readings are as follows: 1/6: 94 1/7: 87 1/8: 86 1/9: 86 1/10: 102  She denies any significant episodes of hypoglycemia. ) An ACE inhibitor/angiotensin II receptor  blocker is being taken. He does not see a podiatrist.Eye exam is current.  Hyperlipidemia This is a chronic problem. The current episode started more than 1 year ago. The problem is controlled. Recent lipid tests were reviewed and are normal. Exacerbating diseases include chronic renal disease, diabetes, hypothyroidism and obesity. Factors aggravating his hyperlipidemia include fatty foods and beta blockers. Pertinent negatives include no chest pain or myalgias. Current antihyperlipidemic treatment includes statins. The current treatment provides moderate improvement of lipids. Compliance problems include adherence to exercise.  Risk factors for coronary artery disease include diabetes mellitus, dyslipidemia, hypertension, male sex, a sedentary lifestyle and obesity.    Review of systems  Constitutional: + Minimally fluctuating body weight,  current There is no height or weight on file to calculate BMI. , + fatigue, no subjective hyperthermia, no subjective hypothermia Eyes: no blurry vision, no xerophthalmia ENT: no sore throat, no nodules palpated in throat, no dysphagia/odynophagia, no hoarseness Cardiovascular: no chest pain, no shortness of breath, no palpitations, no leg swelling Respiratory: no cough, no shortness of  breath Gastrointestinal: no nausea/vomiting/diarrhea Musculoskeletal: no muscle/joint aches, + recent fall with right humerus fracture- currently in sling, essentially WC bound due to deconditioning and disequilibrium Skin: no rashes, no hyperemia Neurological: no tremors, no numbness, no tingling, no dizziness Psychiatric: no depression, no anxiety   Objective:     Vitals:   12/21/20 1037  BP: (!) 132/95  Pulse: (!) 104    Wt Readings from Last 3 Encounters:  08/14/20 250 lb (113.4 kg)  03/04/20 248 lb 3.8 oz (112.6 kg)  09/06/19 250 lb (113.4 kg)   BP Readings from Last 3 Encounters:  12/21/20 (!) 132/95  11/25/20 (!) 140/94  11/24/20 (!) 136/91   Physical Exam- Telehealth- significantly limited due to nature of visit  Constitutional: There is no height or weight on file to calculate BMI. , not in acute distress, normal state of mind Respiratory: Adequate breathing efforts   CMP  CMP Latest Ref Rng & Units 12/16/2020 09/02/2020 08/19/2020  Glucose 65 - 99 mg/dL 105(H) 96 -  BUN 8 - 27 mg/dL 28(H) 15 -  Creatinine 0.76 - 1.27 mg/dL 1.99(H) 1.58(H) 1.70(H)  Sodium 134 - 144 mmol/L 143 143 -  Potassium 3.5 - 5.2 mmol/L 4.3 4.3 -  Chloride 96 - 106 mmol/L 105 106 -  CO2 20 - 29 mmol/L 20 27 -  Calcium 8.6 - 10.2 mg/dL 10.7(H) 10.4(H) -  Total Protein 6.0 - 8.5 g/dL 6.8 6.7 -  Total Bilirubin 0.0 - 1.2 mg/dL 0.2 0.4 -  Alkaline Phos 44 - 121 IU/L 152(H) - -  AST 0 - 40 IU/L 13 10 -  ALT 0 - 44 IU/L 10 6(L) -    Diabetic Labs (most recent): Lab Results  Component Value Date   HGBA1C 6.9 (H) 12/16/2020   HGBA1C 6.2 (H) 09/02/2020   HGBA1C 7.4 (H) 03/03/2020   Lipid Panel     Component Value Date/Time   CHOL 138 05/12/2020 0948   TRIG 153 (H) 05/12/2020 0948   HDL 50 05/12/2020 0948   CHOLHDL 2.8 05/12/2020 0948   VLDL 66 (H) 05/04/2017 0931   LDLCALC 64 05/12/2020 0948     Assessment & Plan:   1) Type 2 diabetes mellitus with stage 2 chronic kidney disease  and CVA  His wife, the patients primary caregiver, presents today for virtual visit with logs showing tight fasting glycemic profile for his age and comorbidities.  His previsit A1c was 6.9% increasing  from 6.2% last visit.  His most recent glucose readings are as follows: 1/6: 94 1/7: 87 1/8: 86 1/9: 86 1/10: 102  She denies any significant episodes of hypoglycemia.  -  His diabetes is complicated by recurrent CVA, stage 3 renal insufficiency, and he remains at extremely high risk for more acute and chronic complications of diabetes which include CAD, CVA, CKD, retinopathy, and neuropathy. -In May 2019, he was diagnosed with non-small cell lung cancer, status post multiple rounds of radiation treatment completed on May 30, 2018.    -Given his tight glycemic profile, he is advised to decrease his Lantus to 36 units SQ daily at bedtime. -He is encouraged to monitor blood glucose readings twice daily, before breakfast and before bed and report findings less than 70 or greater than 200 for 3 tests in a row.  -Patient is not a candidate for SGLT2 inhibitors due to CKD, nor full dose Metformin, not a suitable candidate for incretin therapy.  2) Hypertension: His blood pressure readings are generally well controlled according to previous visit readings and at home measurements.  He is advised to continue Metoprolol 75 mg po daily, Lisinopril 20 mg po daily,  Lasix 40 mg po daily, Clonidine 0.2 mg po twice daily, and Norvasc 10 mg po daily.    3) Lipids/HPL:  -His most recent lipid panel from 05/12/20 shows controlled LDL at 64 and triglycerides of 145.  He is advised to continue Simvastatin 20 mg po daily at bedtime.  Side effects and precautions discussed with him.  4) Hypercalcemia- -review of his medical records indicate high PTH associated with high calcium.  His calcium level on 08/10/20 was 10.2, improving. Due to CKD, suspect tertiary hyperparathyroidism.  At this time, I advised to  discontinue vitamin D supplements.  Will be considered for Sensipar therapy if calcium is above 11.5 on next measurement.  May benefit from referral to nephrology, I discussed and initiated this referral today.  5) Hypothyroidism: -His recent thyroid function tests are consistent with appropriate hormone replacement.  He is advised to continue Levothyroxine 88 mcg po daily before breakfast.   - We discussed about the correct intake of his thyroid hormone, on empty stomach at fasting, with water, separated by at least 30 minutes from breakfast and other medications,  and separated by more than 4 hours from calcium, iron, multivitamins, acid reflux medications (PPIs). -Patient is made aware of the fact that thyroid hormone replacement is needed for life, dose to be adjusted by periodic monitoring of thyroid function tests.  6) Vitamin D deficiency -His vitamin D level on 05/12/20 was 59 after replenishment treatment.  He is advised to avoid any additional vitamin D supplementation at this time.  - I advised patient to maintain close follow up with Dr. Buelah Manis for primary care needs.   I spent 30 minutes dedicated to the care of this patient on the date of this encounter to include pre-visit review of records, face-to-face time with the patient, and post visit ordering of  Testing.    Please refer to Patient Instructions for Blood Glucose Monitoring and Insulin/Medications Dosing Guide"  in media tab for additional information. Please  also refer to " Patient Self Inventory" in the Media  tab for reviewed elements of pertinent patient history.  [name] and his wife [name] participated in the discussions, expressed understanding, and voiced agreement with the above plans.  All questions were answered to his satisfaction. he is encouraged to contact clinic should he  have any questions or concerns prior to his return visit.   Follow up plan: Return in about 4 months (around 04/20/2021) for  Diabetes follow up with A1c in office, No previsit labs, Bring glucometer and logs, ABI next visit.  Rayetta Pigg, [facility] [address] [place],  [address] Phone: [phone] Fax: [phone]  12/21/2020, 11:52 AM

## 2020-12-23 ENCOUNTER — Ambulatory Visit: Payer: Medicare Other

## 2020-12-23 ENCOUNTER — Other Ambulatory Visit: Payer: Self-pay

## 2020-12-23 ENCOUNTER — Encounter: Payer: Self-pay | Admitting: Orthopedic Surgery

## 2020-12-23 ENCOUNTER — Telehealth: Payer: Self-pay | Admitting: Pharmacist

## 2020-12-23 ENCOUNTER — Ambulatory Visit (INDEPENDENT_AMBULATORY_CARE_PROVIDER_SITE_OTHER): Payer: Medicare Other | Admitting: Orthopedic Surgery

## 2020-12-23 VITALS — BP 145/96 | HR 105 | Ht 72.0 in

## 2020-12-23 DIAGNOSIS — S42201A Unspecified fracture of upper end of right humerus, initial encounter for closed fracture: Secondary | ICD-10-CM

## 2020-12-23 NOTE — Progress Notes (Addendum)
Chronic Care Management Pharmacy Assistant   Name: [name]  MRN: [id number] DOB: 03-07-1946  Reason for Encounter: Disease State for DM  Patient Questions:  1.  Have you seen any other providers since your last visit? Yes.   2.  Any changes in your medicines or health? Yes.   PCP : [name], MD   Office Visits: None since 11/19/20  Consults: 12/24/19 Ortho No need for the sling patients proximal humerus fracture has healed. No further treatment needed.  No medication changes.  12/22/19 Endo Rayetta Pigg, NP (Video call), DECREASED Insulin Glargine to 36 units subcutaneous daily at bedtime.  11/25/20 Ortho Dr. Alden Hipp a sling to wear to treat his injury check up in 3-4 weeks. No medication changes.  11/24/20 Rad Onc No information given.  Allergies:  No Known Allergies  Medications: Outpatient Encounter Medications as of 12/23/2020  Medication Sig   allopurinol (ZYLOPRIM) 300 MG tablet TAKE 1 TABLET(300 MG) BY MOUTH DAILY   amLODipine (NORVASC) 10 MG tablet TAKE 1 TABLET(10 MG) BY MOUTH DAILY   cloNIDine (CATAPRES) 0.2 MG tablet Take 1 tablet (0.2 mg total) by mouth 2 (two) times daily. (Patient taking differently: Take 0.2 mg by mouth daily.)   diclofenac sodium (VOLTAREN) 1 % GEL Apply twice a day for affected area on ankle as needed (Patient taking differently: Apply 4 g topically 2 (two) times daily as needed. Apply twice a day for affected area on ankle as needed)   fluticasone (FLONASE) 50 MCG/ACT nasal spray Place 2 sprays into both nostrils daily.   furosemide (LASIX) 40 MG tablet Take 1 tablet (40 mg total) by mouth daily. May give additional dose QD PRN for increased edema.   gabapentin (NEURONTIN) 300 MG capsule TAKE 2 CAPSULES BY MOUTH TWICE DAILY, MAY TAKE AN ADDTIONAL 2 CAPSULES MIDDAY AS NEEDED FOR PAIN   glucose blood (ONETOUCH VERIO) test strip Use as directed to check Blood Glucose twice daily   insulin glargine (LANTUS SOLOSTAR) 100  UNIT/ML Solostar Pen Inject 36 Units into the skin at bedtime.   iron polysaccharides (NIFEREX) 150 MG capsule Take 1 capsule (150 mg total) by mouth daily.   Lancets (ONETOUCH DELICA PLUS [id number]) MISC USE TO TEST BLOOD SUGAR FOUR TIMES DAILY (Patient taking differently: 1 Device by Other route in the morning, at noon, in the evening, and at bedtime.)   lisinopril (ZESTRIL) 20 MG tablet TAKE 1 TABLET(20 MG) BY MOUTH DAILY   lubiprostone (AMITIZA) 24 MCG capsule TAKE 1 CAPSULE(24 MCG) BY MOUTH TWICE DAILY WITH A MEAL   metoprolol succinate (TOPROL-XL) 50 MG 24 hr tablet TAKE 1 TABLET BY MOUTH EVERY DAY WITH OR IMMEDIATELY FOLLOWING A MEAL   nystatin (MYCOSTATIN/NYSTOP) powder Apply topically 3 (three) times daily.   oxyCODONE-acetaminophen (PERCOCET) 7.5-325 MG tablet Take 1 tablet by mouth every 4 (four) hours as needed.   pantoprazole (PROTONIX) 40 MG tablet TAKE 1 TABLET(40 MG) BY MOUTH DAILY   simvastatin (ZOCOR) 20 MG tablet TAKE 1 TABLET(20 MG) BY MOUTH AT BEDTIME   tamsulosin (FLOMAX) 0.4 MG CAPS capsule TAKE 1 CAPSULE(0.4 MG) BY MOUTH DAILY   tiZANidine (ZANAFLEX) 2 MG tablet TAKE 1 TABLET(2 MG) BY MOUTH TWICE DAILY AS NEEDED   Triamcinolone Acetonide (TRIAMCINOLONE 0.1 % CREAM : EUCERIN) CREA Apply 1 application topically 2 (two) times daily as needed. (Patient taking differently: Apply 1 application topically 2 (two) times daily as needed for rash.)   No facility-administered encounter medications on file as of 12/23/2020.  Current Diagnosis: Patient Active Problem List   Diagnosis Date Noted   GI bleed 03/18/2020   Acute lower GI bleeding 03/03/2020   Pressure injury of skin 03/03/2020   Anemia    Thoracic aortic aneurysm without rupture ([place]) 11/19/2019   Aortic atherosclerosis ([place]) 11/19/2019   Protein-calorie malnutrition ([place]) 06/10/2019   Vitamin D deficiency 03/07/2019   Hypogonadism, male 07/26/2018   Gynecomastia, male 06/27/2018   Malignant neoplasm of upper lobe  of left lung ([place]) 04/06/2018   History of parotid cancer 02/07/2018   RUQ pain 11/09/2016   Colon adenomas 11/09/2016   Primary hypothyroidism 09/09/2015   Chronic constipation 05/22/2015   Candidiasis, intertrigo 09/05/2014   Arthritis, shoulder region 12/10/2013   Impetigo bullosa 10/23/2013   Intertrigo 10/12/2013   Chronic left shoulder pain 09/25/2013   Muscle weakness (generalized) 09/25/2013   Hypercalcemia 08/08/2013   CKD (chronic kidney disease), stage III ([place]) 08/07/2013   AKI (acute kidney injury) ([place]) 07/24/2013   Adhesive capsulitis of left shoulder 05/14/2013   Steatohepatitis    Diverticulosis    GERD (gastroesophageal reflux disease)    History of CVA (cerebrovascular accident)    Hiatal hernia    HLD (hyperlipidemia)    Glaucoma (increased eye pressure)    Esotropia of left eye    BPH (benign prostatic hyperplasia)    Hx of radiation therapy    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   Epidermal inclusion cyst 10/05/2012   Neck pain 08/26/2012   Chronic abdominal pain 08/26/2012   Leg edema 08/24/2012   Hemiplegia of dominant side as late effect following cerebrovascular disease ([place]) 06/06/2012   Diarrhea 04/17/2012   Obesity 04/17/2012   Diabetic neuropathy ([place]) 02/13/2012   Recurrent boils 01/12/2012   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 ([place]) 07/15/2011   Hemorrhoids, internal 03/23/2011   Hepatitis 03/02/2011   EOSINOPHILIA 12/23/2009   Type 2 diabetes mellitus with stage 2 chronic kidney disease, with long-term current use of insulin ([place]) 01/05/2009   Gout, unspecified 01/05/2009   HTN (hypertension) 01/05/2009   BARRETTS ESOPHAGUS 01/05/2009   DIVERTICULOSIS OF COLON 01/05/2009   History of lymphoma 01/05/2009    Goals Addressed   None    Recent Relevant Labs: Lab Results  Component Value Date/Time   HGBA1C 6.9 (H) 12/16/2020 09:18 AM   HGBA1C 6.2 (H) 09/02/2020 10:19 AM   HGBA1C 6.7 12/03/2015 12:00 AM    MICROALBUR 123.0 05/12/2020 09:48 AM   MICROALBUR 232.3 05/04/2017 09:31 AM    Kidney Function Lab Results  Component Value Date/Time   CREATININE 1.99 (H) 12/16/2020 09:18 AM   CREATININE 1.58 (H) 09/02/2020 10:19 AM   CREATININE 1.70 (H) 08/19/2020 09:47 AM   CREATININE 1.77 (H) 06/10/2020 04:22 PM   GFRNONAA 32 (L) 12/16/2020 09:18 AM   GFRNONAA 35 (L) 05/12/2020 09:48 AM   GFRAA 37 (L) 12/16/2020 09:18 AM   GFRAA 41 (L) 05/12/2020 09:48 AM    Current antihyperglycemic regimen:  Lantus 100u/ml 36 units hs  What recent interventions/DTPs have been made to improve glycemic control:  None.   Have there been any recent hospitalizations or ED visits since last visit with CPP? Patient wife stated he has not been to the hospital.    Patient wife  reports hypoglycemic symptoms, including None   Patient wife reports hyperglycemic symptoms, including none   How often are you checking your blood sugar? Patient wife stated she checks his blood sugar  once daily .  What are your  blood sugars ranging?  Fasting: 12/10/20 83 AM 12/11/20 78 AM 12/13/19 85 AM 12/14/19 90 AM 12/15/19 82 AM 12/16/19 87 AM 12/17/19 97 AM 12/18/19 94 AM 12/19/19 87 AM 12/20/19 86 AM 12/21/19 86 AM The 10 th is when the lantus dose was changed to 42-36 and she didn't check it.  12/22/20 150 AM 12/23/20 115 AM Before meals: N/A After meals: N/A Bedtime: N/A  During the week, how often does your blood glucose drop below 70? Once a week   Are you checking your feet daily/regularly?  Patient wife stated she checks his feet daily.   Adherence Review Is the patient currently on a STATIN medication? Simvastatin 20 mg   Is the patient currently on ACE/ARB medication? Lisinopril 20 mg,   Does the patient have >5 day gap between last estimated fill dates? No, CPP Please Check.   Patient wife stated they are not having any concerns or problems getting his medications at this time.   Follow-Up:   Pharmacist Review   Charlann Lange, RMA Clinical Pharmacist Assistant [phone]  7 minutes spent in review, coordination, and documentation.  Reviewed by: Beverly Milch, PharmD Clinical Pharmacist [facility] [phone]

## 2020-12-23 NOTE — Progress Notes (Addendum)
Orthopaedic Clinic Return  Assessment: [name] is a 75 y.o. male with the following: Right proximal humerus fracture; treated in a sling  Plan: Mr. Mitton has healed his proximal humerus fracture well.  He tolerates gentle range of motion.  According to family, he appears to be more comfortable.  He does not need to continue using the sling.  X-rays look very good compared to previous x-rays.  In addition, he has limited use of his right upper extremity following a stroke.  At this point, there is little value in physical therapy.  He can use his right arm as needed.  No restrictions.  If they have any issues or concerns in the future, I have encouraged him to contact the clinic.  Body mass index is 33.91 kg/m.  Follow-up: Return if symptoms worsen or fail to improve.   Subjective:  Chief Complaint  Patient presents with  . Shoulder Pain    Right shoulder,     History of Present Illness: [name] is a 75 y.o. male who returns to clinic today for repeat evaluation of his right shoulder.  He sustained a proximal humerus fracture in November, 2021.  He was evaluated in clinic a couple of weeks later.  It is now more than 6 weeks out since the injury.  His pain is improved.  He is use the sling as recommended at all times.  He is no longer taking medications.  Occasionally, he will complain of some discomfort in the shoulder.  Prior to this, he did have a stroke, with limited use of his right upper extremity.  Review of Systems: No fevers or chills No chest pain No shortness of breath No bowel or bladder dysfunction No GI distress    Objective: BP (!) 145/96   Pulse (!) 105   Ht 6' (1.829 m)   BMI 33.91 kg/m   Physical Exam:  Seated in a wheelchair.  No active motion in his right upper extremity.  Permanent wrist drop.  Fingers are warm and well-perfused.  He tolerates gentle range of motion of the right shoulder.  No pain with testing.  IMAGING: I personally  ordered and reviewed the following images:  X-ray of the right shoulder demonstrates proximal humerus fracture, with subluxation of the humeral head due to disuse.  Humeral head intact.  No interval displacement.  Evidence of callus formation.   Impression: Healing right proximal humerus fracture.   Jason Rasmussen, MD 12/23/2020 11:59 AM

## 2021-01-02 IMAGING — CT CT CHEST W/O CM
2 of 3 series · 15 of 36 positions shown, 18 images · non-contrast
Comparison: 11/08/2019

CLINICAL DATA: Non-small cell lung cancer.  Restaging.

EXAM:
CT CHEST WITHOUT CONTRAST
TECHNIQUE: Multidetector CT imaging of the chest was performed following the
standard protocol without IV contrast.

[Series 3: thorax · axial · 0.83mm/px · z∈[+46,+360]mm · 12 of 185 slices shown, 15 images]
[im 14/185  mediastinal]
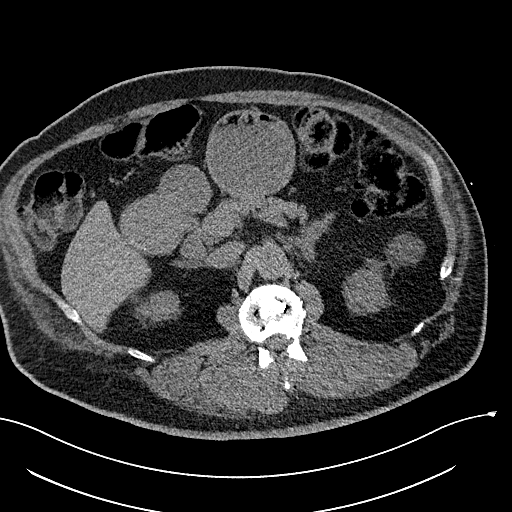
[im 14/185  lung]
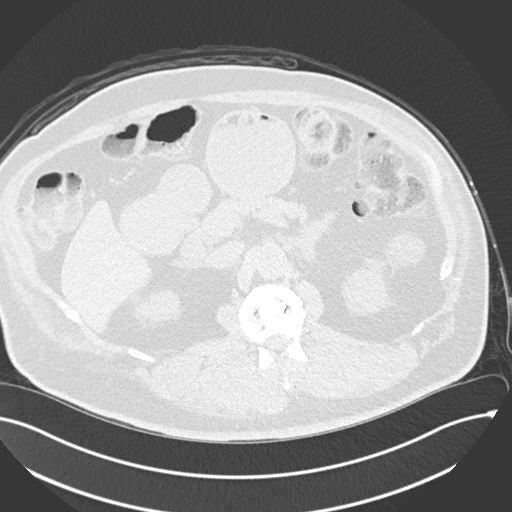
[im 28/185  lung]
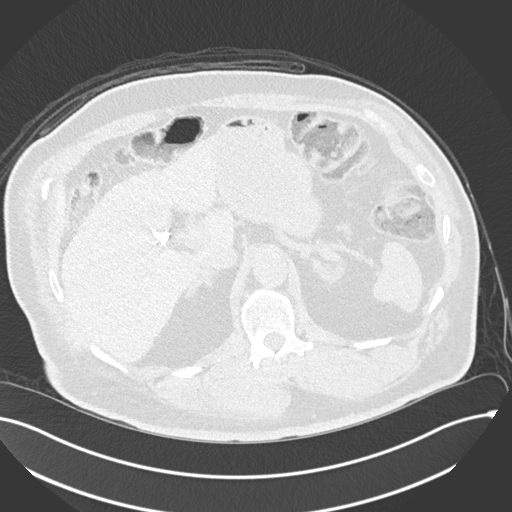
[im 41/185  lung]
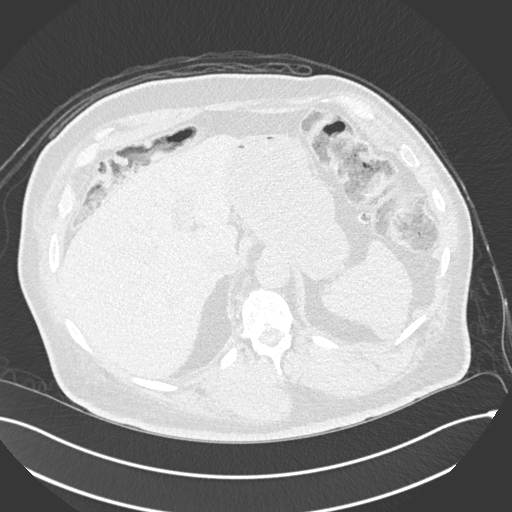
[im 55/185  lung]
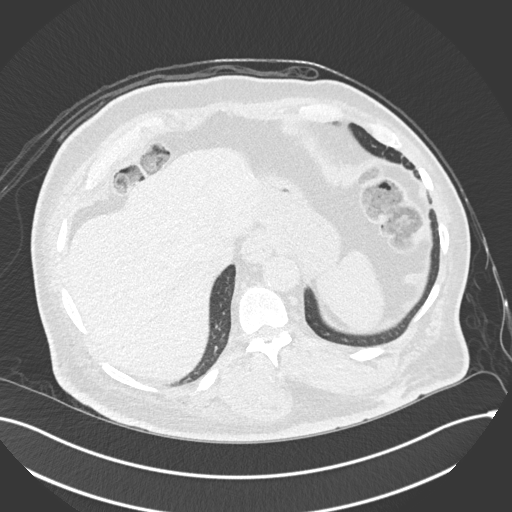
[im 69/185  mediastinal]
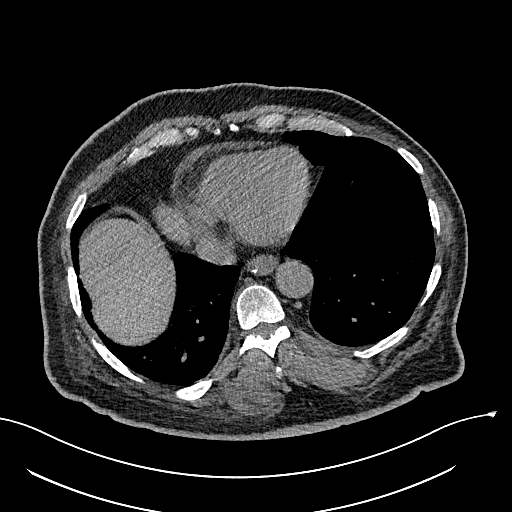
[im 69/185  lung]
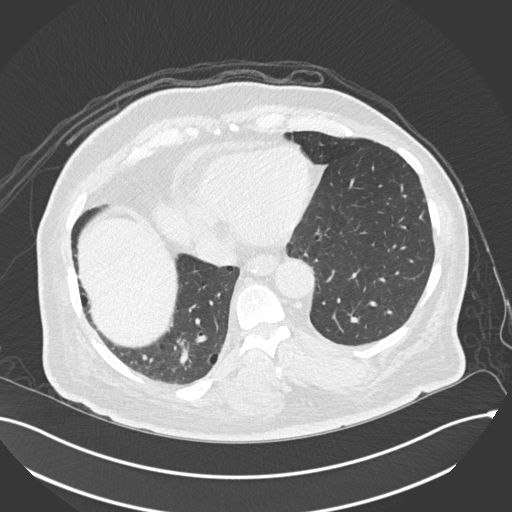
[im 82/185  lung]
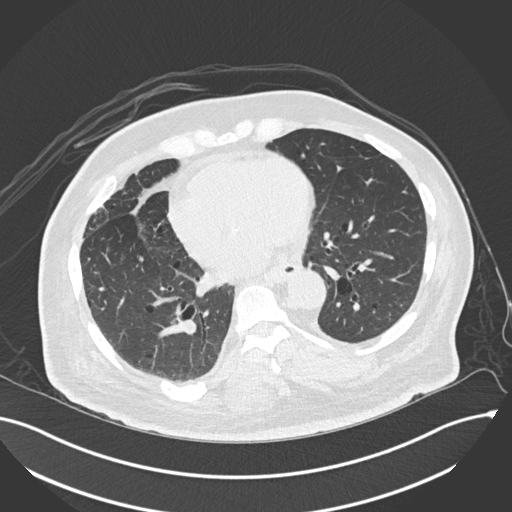
[im 103/185  lung]
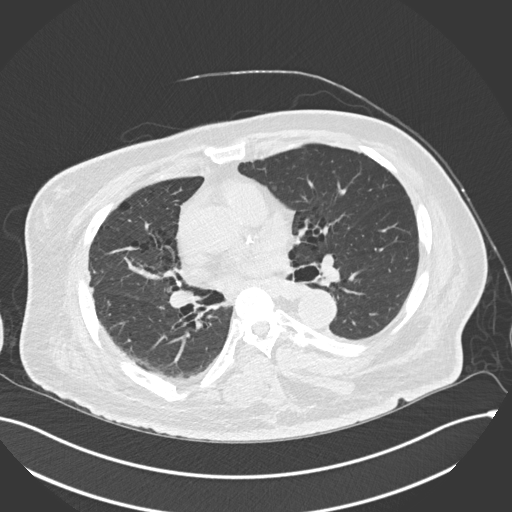
[im 116/185  lung]
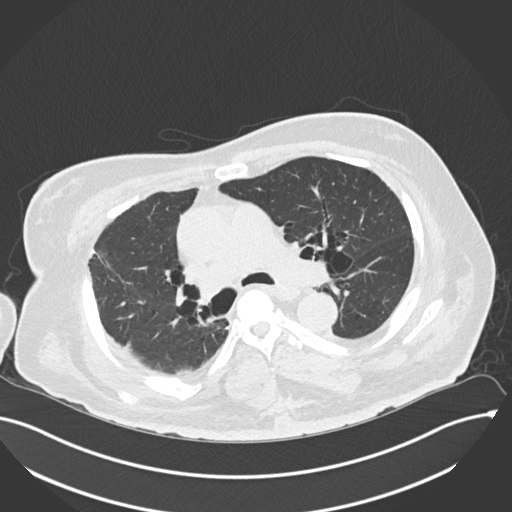
[im 130/185  mediastinal]
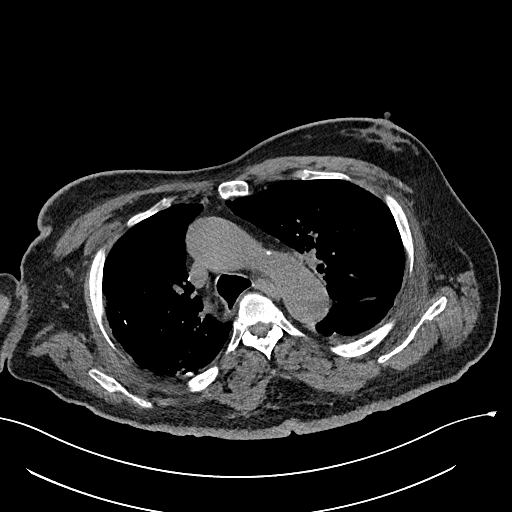
[im 130/185  lung]
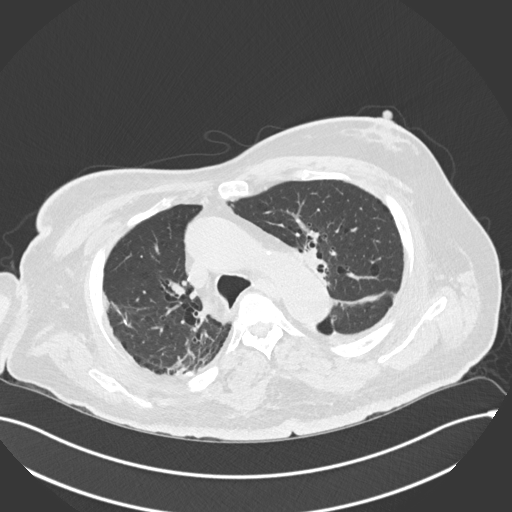
[im 144/185  lung]
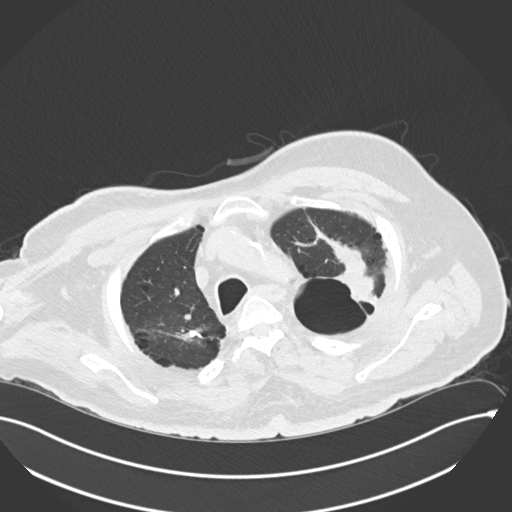
[im 157/185  lung]
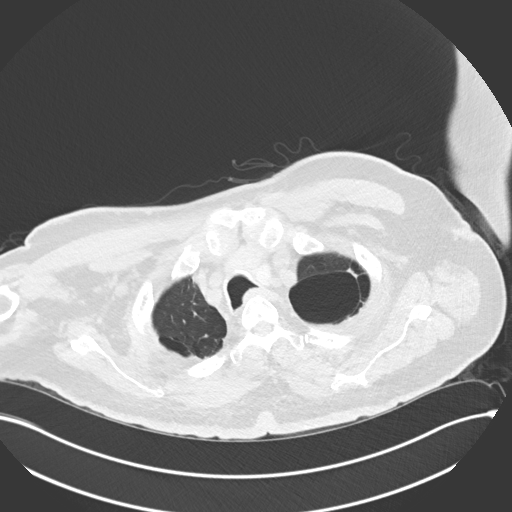
[im 171/185  lung]
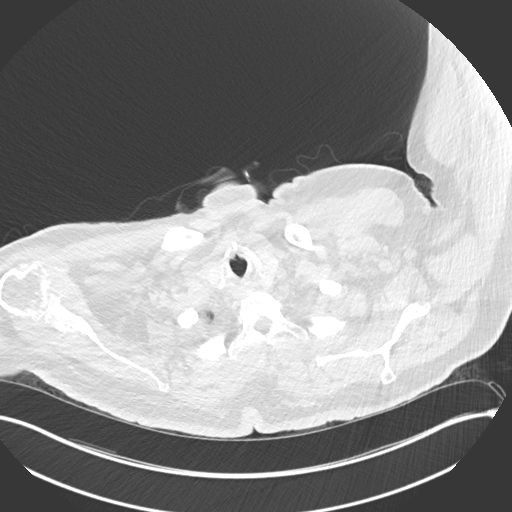

[Series 6: coronal · coronal · 0.70mm/px · 3 of 161 slices shown]
[im 33/161  lung]
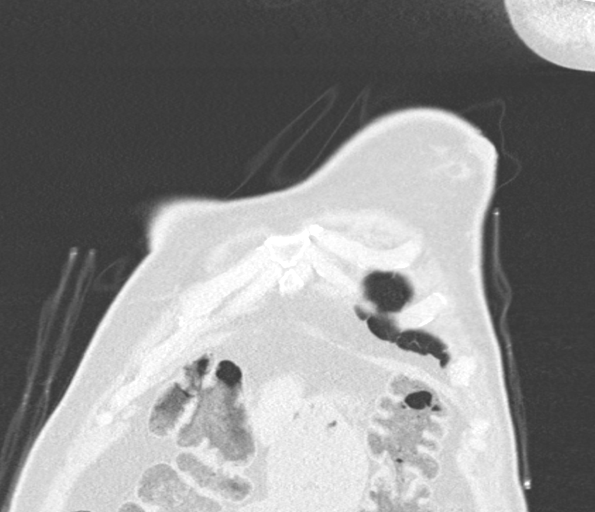
[im 65/161  lung]
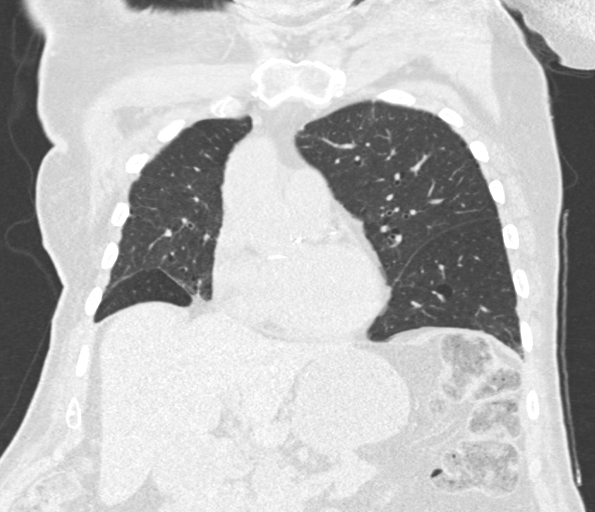
[im 97/161  lung]
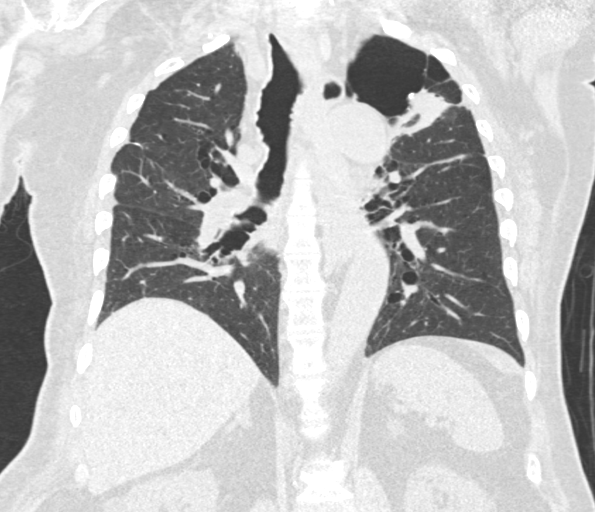

[15 of 36 positions shown; findings below may reference images not displayed]

FINDINGS: Cardiovascular: Normal heart size. Increase caliber of the ascending
thoracic aorta measures 4.2 cm on today's exam, image 76/3.
Previously reported at 4.4 cm. Aortic atherosclerosis. Left main,
lad, left circumflex coronary artery calcifications noted.

Mediastinum/Nodes: Normal appearance of the thyroid gland. The
trachea appears patent and is midline. Normal appearance of the
esophagus. No enlarged mediastinal or hilar lymph nodes identified.

Lungs/Pleura: No pleural effusion. Moderate paraseptal emphysema.
Postsurgical changes from previous wedge resections noted within the
right upper lobe. Masslike architectural distortion within the
lateral left upper lobe is again identified and appears unchanged in
the interval. Adjacent pneumatocele versus chronic loculated
pneumothorax is also unchanged in the interval measuring 6.2 cm,
image 42/8. No new findings to suggest residual/recurrence of
tumor.

Upper Abdomen: No acute findings. Cholecystectomy. Bilateral kidney
cysts identified.

Musculoskeletal: Remote right posterior rib deformities. L2
compression fracture status post kyphoplasty. Unchanged. No new
compression fractures.
IMPRESSION: 1. Stable CT of the chest. No specific findings identified to
suggest residual or recurrence of tumor.
2. Unchanged appearance of masslike architectural distortion within
the lateral left upper lobe with adjacent pneumatocele versus
chronic loculated pneumothorax. Findings compatible with post
treatment/radiation change.
3. Aortic atherosclerosis. Multi vessel coronary artery
calcifications noted.

Aortic Atherosclerosis (YMXJV-ZI9.9) and Emphysema (YMXJV-G5H.C).

## 2021-01-03 ENCOUNTER — Other Ambulatory Visit: Payer: Self-pay | Admitting: Family Medicine

## 2021-01-05 ENCOUNTER — Other Ambulatory Visit: Payer: Self-pay | Admitting: Family Medicine

## 2021-01-05 DIAGNOSIS — D649 Anemia, unspecified: Secondary | ICD-10-CM

## 2021-01-05 DIAGNOSIS — H5000 Unspecified esotropia: Secondary | ICD-10-CM | POA: Insufficient documentation

## 2021-01-05 DIAGNOSIS — K449 Diaphragmatic hernia without obstruction or gangrene: Secondary | ICD-10-CM | POA: Insufficient documentation

## 2021-01-05 DIAGNOSIS — K5790 Diverticulosis of intestine, part unspecified, without perforation or abscess without bleeding: Secondary | ICD-10-CM | POA: Insufficient documentation

## 2021-01-05 DIAGNOSIS — Z8673 Personal history of transient ischemic attack (TIA), and cerebral infarction without residual deficits: Secondary | ICD-10-CM | POA: Insufficient documentation

## 2021-01-05 DIAGNOSIS — Z923 Personal history of irradiation: Secondary | ICD-10-CM | POA: Insufficient documentation

## 2021-01-05 DIAGNOSIS — N400 Enlarged prostate without lower urinary tract symptoms: Secondary | ICD-10-CM | POA: Insufficient documentation

## 2021-01-05 DIAGNOSIS — E785 Hyperlipidemia, unspecified: Secondary | ICD-10-CM | POA: Insufficient documentation

## 2021-01-05 DIAGNOSIS — H409 Unspecified glaucoma: Secondary | ICD-10-CM | POA: Insufficient documentation

## 2021-01-07 DIAGNOSIS — Z79899 Other long term (current) drug therapy: Secondary | ICD-10-CM | POA: Diagnosis not present

## 2021-01-07 DIAGNOSIS — I5032 Chronic diastolic (congestive) heart failure: Secondary | ICD-10-CM | POA: Diagnosis not present

## 2021-01-07 DIAGNOSIS — E210 Primary hyperparathyroidism: Secondary | ICD-10-CM | POA: Diagnosis not present

## 2021-01-07 DIAGNOSIS — N189 Chronic kidney disease, unspecified: Secondary | ICD-10-CM | POA: Diagnosis not present

## 2021-01-07 DIAGNOSIS — R809 Proteinuria, unspecified: Secondary | ICD-10-CM | POA: Diagnosis not present

## 2021-01-07 DIAGNOSIS — N281 Cyst of kidney, acquired: Secondary | ICD-10-CM | POA: Diagnosis not present

## 2021-01-07 DIAGNOSIS — D631 Anemia in chronic kidney disease: Secondary | ICD-10-CM | POA: Diagnosis not present

## 2021-01-07 DIAGNOSIS — E1129 Type 2 diabetes mellitus with other diabetic kidney complication: Secondary | ICD-10-CM | POA: Diagnosis not present

## 2021-01-07 DIAGNOSIS — E1122 Type 2 diabetes mellitus with diabetic chronic kidney disease: Secondary | ICD-10-CM | POA: Diagnosis not present

## 2021-01-11 DIAGNOSIS — R809 Proteinuria, unspecified: Secondary | ICD-10-CM | POA: Diagnosis not present

## 2021-01-11 DIAGNOSIS — E1129 Type 2 diabetes mellitus with other diabetic kidney complication: Secondary | ICD-10-CM | POA: Diagnosis not present

## 2021-01-11 DIAGNOSIS — Z79899 Other long term (current) drug therapy: Secondary | ICD-10-CM | POA: Diagnosis not present

## 2021-01-11 DIAGNOSIS — E1122 Type 2 diabetes mellitus with diabetic chronic kidney disease: Secondary | ICD-10-CM | POA: Diagnosis not present

## 2021-01-11 DIAGNOSIS — N189 Chronic kidney disease, unspecified: Secondary | ICD-10-CM | POA: Diagnosis not present

## 2021-01-11 DIAGNOSIS — E210 Primary hyperparathyroidism: Secondary | ICD-10-CM | POA: Diagnosis not present

## 2021-01-25 ENCOUNTER — Other Ambulatory Visit (HOSPITAL_COMMUNITY): Payer: Self-pay | Admitting: Nephrology

## 2021-01-25 DIAGNOSIS — I5032 Chronic diastolic (congestive) heart failure: Secondary | ICD-10-CM

## 2021-01-25 DIAGNOSIS — E1122 Type 2 diabetes mellitus with diabetic chronic kidney disease: Secondary | ICD-10-CM

## 2021-01-25 DIAGNOSIS — E1129 Type 2 diabetes mellitus with other diabetic kidney complication: Secondary | ICD-10-CM

## 2021-01-25 DIAGNOSIS — D631 Anemia in chronic kidney disease: Secondary | ICD-10-CM

## 2021-01-25 DIAGNOSIS — N390 Urinary tract infection, site not specified: Secondary | ICD-10-CM | POA: Diagnosis not present

## 2021-01-25 DIAGNOSIS — E210 Primary hyperparathyroidism: Secondary | ICD-10-CM

## 2021-01-25 DIAGNOSIS — N281 Cyst of kidney, acquired: Secondary | ICD-10-CM

## 2021-01-26 ENCOUNTER — Ambulatory Visit (INDEPENDENT_AMBULATORY_CARE_PROVIDER_SITE_OTHER): Payer: Medicare Other | Admitting: Family Medicine

## 2021-01-26 ENCOUNTER — Other Ambulatory Visit: Payer: Self-pay

## 2021-01-26 VITALS — BP 148/88 | HR 80 | Temp 98.5°F | Resp 16

## 2021-01-26 DIAGNOSIS — E782 Mixed hyperlipidemia: Secondary | ICD-10-CM | POA: Diagnosis not present

## 2021-01-26 DIAGNOSIS — N1831 Chronic kidney disease, stage 3a: Secondary | ICD-10-CM | POA: Diagnosis not present

## 2021-01-26 DIAGNOSIS — K5909 Other constipation: Secondary | ICD-10-CM

## 2021-01-26 DIAGNOSIS — Z8572 Personal history of non-Hodgkin lymphomas: Secondary | ICD-10-CM

## 2021-01-26 DIAGNOSIS — R109 Unspecified abdominal pain: Secondary | ICD-10-CM | POA: Diagnosis not present

## 2021-01-26 DIAGNOSIS — E1122 Type 2 diabetes mellitus with diabetic chronic kidney disease: Secondary | ICD-10-CM

## 2021-01-26 DIAGNOSIS — I69959 Hemiplegia and hemiparesis following unspecified cerebrovascular disease affecting unspecified side: Secondary | ICD-10-CM | POA: Diagnosis not present

## 2021-01-26 DIAGNOSIS — Z8673 Personal history of transient ischemic attack (TIA), and cerebral infarction without residual deficits: Secondary | ICD-10-CM | POA: Diagnosis not present

## 2021-01-26 DIAGNOSIS — Z8579 Personal history of other malignant neoplasms of lymphoid, hematopoietic and related tissues: Secondary | ICD-10-CM

## 2021-01-26 DIAGNOSIS — I10 Essential (primary) hypertension: Secondary | ICD-10-CM | POA: Diagnosis not present

## 2021-01-26 DIAGNOSIS — G8929 Other chronic pain: Secondary | ICD-10-CM

## 2021-01-26 DIAGNOSIS — Z794 Long term (current) use of insulin: Secondary | ICD-10-CM

## 2021-01-26 DIAGNOSIS — N182 Chronic kidney disease, stage 2 (mild): Secondary | ICD-10-CM | POA: Diagnosis not present

## 2021-01-26 MED ORDER — NYSTATIN 100000 UNIT/GM EX POWD: Freq: Three times a day (TID) | CUTANEOUS | 2 refills | Status: DC

## 2021-01-26 MED ORDER — OXYCODONE-ACETAMINOPHEN 7.5-325 MG PO TABS: 1.0000 | ORAL_TABLET | ORAL | 0 refills | Status: DC | PRN

## 2021-01-26 NOTE — Progress Notes (Signed)
° °  Subjective:    Patient ID: [name], male    DOB: 1946-09-14, 75 y.o.   MRN: [id number]  Patient presents for Follow-up (Is not fasting/)   He was seen by nephrology renal US is pending due to CKD and proteinuria    DM- last A1 6.9 , followed by endocrinology, doing well    Chronic pain/ needs pain meds    Constpation- linzess once a day , BM every coule days    [facility] care in Nov , no retinopathy   He has has some falls/stumbles  trying to get into bathroom, unable to get chair through door, he has bedside commode but doesn't use   Review Of Systems:  GEN- denies fatigue, fever, weight loss,weakness, recent illness HEENT- denies eye drainage, change in vision, nasal discharge, CVS- denies chest pain, palpitations RESP- denies SOB, cough, wheeze ABD- denies N/V, change in stools, + chronic abd pain GU- denies dysuria, hematuria, dribbling, incontinence MSK- + joint pain, muscle aches, injury Neuro- denies headache, dizziness, syncope, seizure activity       Objective:    BP (!) 148/88    Pulse 80    Temp 98.5 F (36.9 C) (Temporal)    Resp 16    SpO2 100%  GEN- NAD, alert and oriented x3,wheelchair bound  HEENT- PERRL, EOMI, chronic tearing right eye non injected sclera, pink conjunctiva, MMM, oropharynx clear Neck- Supple, no thyromegaly, small Right submanibular node, scarring right neck, CVS- RRR, no murmur RESP-CTAB ABD-NABS,soft, mild palpation right upper quadrant, ND, no CVA tenderness NEURO- no new focal deficits, Right hemiplegia, decreased Tone   EXT- chronic non pitting  edema Pulses- Radial 2+       Assessment & Plan:      Problem List Items Addressed This Visit      Unprioritized   Chronic abdominal pain    maintained on pain meds Has seen GI, extensive work up, just pain control       Relevant Medications   oxyCODONE-acetaminophen (PERCOCET) 7.5-325 MG tablet   Chronic constipation    Continue linzess       CKD (chronic  kidney disease), stage III ([place])    Being worked up by nephrology       Hemiplegia of dominant side as late effect following cerebrovascular disease ([place]) (Chronic)    Discussed use of bedside commode to prevent falls Pt willing to do so      History of CVA (cerebrovascular accident)   History of lymphoma    Yearly follow up with oncology , history of parotid adenocarcinoma as well       HLD (hyperlipidemia)    On zocor       HTN (hypertension) - Primary    Mild elevation bp fluctates Wife does check at home       Type 2 diabetes mellitus with stage 2 chronic kidney disease, with long-term current use of insulin ([place])    Controlled followed by endocrinology          Note: This dictation was prepared with Dragon dictation along with smaller phrase technology. Any transcriptional errors that result from this process are unintentional.

## 2021-01-26 NOTE — Patient Instructions (Signed)
Medications refilled

## 2021-01-27 ENCOUNTER — Encounter: Payer: Self-pay | Admitting: Family Medicine

## 2021-01-27 NOTE — Assessment & Plan Note (Signed)
Continue linzess

## 2021-01-27 NOTE — Assessment & Plan Note (Signed)
maintained on pain meds Has seen GI, extensive work up, just pain control

## 2021-01-27 NOTE — Assessment & Plan Note (Signed)
Mild elevation bp fluctates Wife does check at home

## 2021-01-27 NOTE — Assessment & Plan Note (Signed)
Controlled followed by endocrinology

## 2021-01-27 NOTE — Assessment & Plan Note (Signed)
On zocor

## 2021-01-27 NOTE — Assessment & Plan Note (Signed)
Yearly follow up with oncology , history of parotid adenocarcinoma as well

## 2021-01-27 NOTE — Assessment & Plan Note (Signed)
Discussed use of bedside commode to prevent falls Pt willing to do so

## 2021-01-27 NOTE — Assessment & Plan Note (Signed)
Being worked up by nephrology

## 2021-01-29 ENCOUNTER — Ambulatory Visit (HOSPITAL_COMMUNITY)
Admission: RE | Admit: 2021-01-29 | Discharge: 2021-01-29 | Disposition: A | Discharge status: Home / Self Care | Payer: Medicare Other | Source: Ambulatory Visit | Attending: Nephrology | Admitting: Nephrology

## 2021-01-29 ENCOUNTER — Other Ambulatory Visit: Payer: Self-pay

## 2021-01-29 DIAGNOSIS — I5032 Chronic diastolic (congestive) heart failure: Secondary | ICD-10-CM

## 2021-01-29 DIAGNOSIS — E210 Primary hyperparathyroidism: Secondary | ICD-10-CM | POA: Diagnosis not present

## 2021-01-29 DIAGNOSIS — N281 Cyst of kidney, acquired: Secondary | ICD-10-CM | POA: Diagnosis not present

## 2021-01-29 DIAGNOSIS — R809 Proteinuria, unspecified: Secondary | ICD-10-CM | POA: Diagnosis not present

## 2021-01-29 DIAGNOSIS — E1129 Type 2 diabetes mellitus with other diabetic kidney complication: Secondary | ICD-10-CM | POA: Diagnosis not present

## 2021-01-29 DIAGNOSIS — N189 Chronic kidney disease, unspecified: Secondary | ICD-10-CM | POA: Diagnosis not present

## 2021-01-29 DIAGNOSIS — E1122 Type 2 diabetes mellitus with diabetic chronic kidney disease: Secondary | ICD-10-CM | POA: Diagnosis not present

## 2021-01-29 DIAGNOSIS — D631 Anemia in chronic kidney disease: Secondary | ICD-10-CM | POA: Diagnosis not present

## 2021-02-02 ENCOUNTER — Telehealth: Payer: Self-pay | Admitting: Pharmacist

## 2021-02-02 NOTE — Progress Notes (Addendum)
Chronic Care Management Pharmacy Assistant   Name: CALHOUN REICHARDT  MRN: [id number] DOB: 03/06/1946  Reason for Encounter: Disease State For DM.  Patient Questions:  1.  Have you seen any other providers since your last visit? Yes.   2.  Any changes in your medicines or health? No.  PCP : [name], MD   Their chronic conditions include: hypertension, GERD/barretts Esophagus, Type II DM, hypothyroidism, CKD, hyperlipidemia, CVA.  Office Visits: 01/26/21 Dr. Buelah Manis Follow up. No medication changes.  Consults: 01/07/21 Nephrology CKD. Labs drawn. Urine taken. No medication changes.  Allergies:  No Known Allergies  Medications: Outpatient Encounter Medications as of 02/02/2021  Medication Sig   allopurinol (ZYLOPRIM) 300 MG tablet TAKE 1 TABLET(300 MG) BY MOUTH DAILY   amLODipine (NORVASC) 10 MG tablet TAKE 1 TABLET(10 MG) BY MOUTH DAILY   cloNIDine (CATAPRES) 0.2 MG tablet TAKE 1 TABLET(0.2 MG) BY MOUTH THREE TIMES DAILY   diclofenac sodium (VOLTAREN) 1 % GEL Apply twice a day for affected area on ankle as needed (Patient taking differently: Apply 4 g topically 2 (two) times daily as needed. Apply twice a day for affected area on ankle as needed)   fluticasone (FLONASE) 50 MCG/ACT nasal spray Place 2 sprays into both nostrils daily.   furosemide (LASIX) 40 MG tablet Take 1 tablet (40 mg total) by mouth daily. May give additional dose QD PRN for increased edema.   gabapentin (NEURONTIN) 300 MG capsule TAKE 2 CAPSULES BY MOUTH TWICE DAILY, MAY TAKE AN ADDTIONAL 2 CAPSULES MIDDAY AS NEEDED FOR PAIN   glucose blood (ONETOUCH VERIO) test strip Use as directed to check Blood Glucose twice daily   insulin glargine (LANTUS SOLOSTAR) 100 UNIT/ML Solostar Pen Inject 36 Units into the skin at bedtime.   iron polysaccharides (NIFEREX) 150 MG capsule Take 1 capsule (150 mg total) by mouth daily.   Lancets (ONETOUCH DELICA PLUS [id number]) MISC USE TO TEST BLOOD SUGAR FOUR TIMES DAILY  (Patient taking differently: 1 Device by Other route in the morning, at noon, in the evening, and at bedtime.)   lisinopril (ZESTRIL) 20 MG tablet TAKE 1 TABLET(20 MG) BY MOUTH DAILY   lubiprostone (AMITIZA) 24 MCG capsule TAKE 1 CAPSULE(24 MCG) BY MOUTH TWICE DAILY WITH A MEAL   metoprolol succinate (TOPROL-XL) 50 MG 24 hr tablet TAKE 1 TABLET BY MOUTH EVERY DAY WITH OR IMMEDIATELY FOLLOWING A MEAL   nystatin (MYCOSTATIN/NYSTOP) powder Apply topically 3 (three) times daily.   oxyCODONE-acetaminophen (PERCOCET) 7.5-325 MG tablet Take 1 tablet by mouth every 4 (four) hours as needed.   pantoprazole (PROTONIX) 40 MG tablet TAKE 1 TABLET(40 MG) BY MOUTH DAILY   simvastatin (ZOCOR) 20 MG tablet TAKE 1 TABLET(20 MG) BY MOUTH AT BEDTIME   tamsulosin (FLOMAX) 0.4 MG CAPS capsule TAKE 1 CAPSULE(0.4 MG) BY MOUTH DAILY   tiZANidine (ZANAFLEX) 2 MG tablet TAKE 1 TABLET(2 MG) BY MOUTH TWICE DAILY AS NEEDED   Triamcinolone Acetonide (TRIAMCINOLONE 0.1 % CREAM : EUCERIN) CREA Apply 1 application topically 2 (two) times daily as needed. (Patient taking differently: Apply 1 application topically 2 (two) times daily as needed for rash.)   No facility-administered encounter medications on file as of 02/02/2021.    Current Diagnosis: Patient Active Problem List   Diagnosis Date Noted   GI bleed 03/18/2020   Acute lower GI bleeding 03/03/2020   Pressure injury of skin 03/03/2020   Anemia    Thoracic aortic aneurysm without rupture ([place]) 11/19/2019   Aortic atherosclerosis (  [place]) 11/19/2019   Protein-calorie malnutrition ([place]) 06/10/2019   Vitamin D deficiency 03/07/2019   Hypogonadism, male 07/26/2018   Gynecomastia, male 06/27/2018   Malignant neoplasm of upper lobe of left lung ([place]) 04/06/2018   History of parotid cancer 02/07/2018   Colon adenomas 11/09/2016   Primary hypothyroidism 09/09/2015   Chronic constipation 05/22/2015   Impetigo bullosa 10/23/2013   Chronic left shoulder pain 09/25/2013    Muscle weakness (generalized) 09/25/2013   Hypercalcemia 08/08/2013   CKD (chronic kidney disease), stage III ([place]) 08/07/2013   AKI (acute kidney injury) ([place]) 07/24/2013   Adhesive capsulitis of left shoulder 05/14/2013   Steatohepatitis    Diverticulosis    GERD (gastroesophageal reflux disease)    History of CVA (cerebrovascular accident)    Hiatal hernia    HLD (hyperlipidemia)    Glaucoma (increased eye pressure)    Esotropia of left eye    BPH (benign prostatic hyperplasia)    Hx of radiation therapy    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   Epidermal inclusion cyst 10/05/2012   Neck pain 08/26/2012   Chronic abdominal pain 08/26/2012   Leg edema 08/24/2012   Hemiplegia of dominant side as late effect following cerebrovascular disease ([place]) 06/06/2012   Diarrhea 04/17/2012   Obesity 04/17/2012   Diabetic neuropathy ([place]) 02/13/2012   Recurrent boils 01/12/2012   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 ([place]) 07/15/2011   Hemorrhoids, internal 03/23/2011   Hepatitis 03/02/2011   EOSINOPHILIA 12/23/2009   Type 2 diabetes mellitus with stage 2 chronic kidney disease, with long-term current use of insulin ([place]) 01/05/2009   Gout, unspecified 01/05/2009   HTN (hypertension) 01/05/2009   BARRETTS ESOPHAGUS 01/05/2009   DIVERTICULOSIS OF COLON 01/05/2009   History of lymphoma 01/05/2009    Goals Addressed   None    Recent Relevant Labs: Lab Results  Component Value Date/Time   HGBA1C 6.9 (H) 12/16/2020 09:18 AM   HGBA1C 6.2 (H) 09/02/2020 10:19 AM   HGBA1C 6.7 12/03/2015 12:00 AM   MICROALBUR 123.0 05/12/2020 09:48 AM   MICROALBUR 232.3 05/04/2017 09:31 AM    Kidney Function Lab Results  Component Value Date/Time   CREATININE 1.99 (H) 12/16/2020 09:18 AM   CREATININE 1.58 (H) 09/02/2020 10:19 AM   CREATININE 1.70 (H) 08/19/2020 09:47 AM   CREATININE 1.77 (H) 06/10/2020 04:22 PM   GFRNONAA 32 (L) 12/16/2020 09:18 AM   GFRNONAA 35 (L)  05/12/2020 09:48 AM   GFRAA 37 (L) 12/16/2020 09:18 AM   GFRAA 41 (L) 05/12/2020 09:48 AM    Current antihyperglycemic regimen:  Lantus 100u/ml 36 units hs  What recent interventions/DTPs have been made to improve glycemic control:  None.  Have there been any recent hospitalizations or ED visits since last visit with CPP? Patient stated no.  Patients wife stated reports hypoglycemic symptoms, including Hungry   Patients wife stated reports hyperglycemic symptoms, including polyuria   How often are you checking your blood sugar? once daily   What are your blood sugars ranging? Patients wife stated his blood sugars been ranging around 77-83.  During the week, how often does your blood glucose drop below 70? Patients wife stated not within the last two weeks.  Are you checking your feet daily/regularly?  Patients wife stated his feet is doing good but she has noticed some swelling on his right side.   Adherence Review: Is the patient currently on a STATIN medication? Yes, Simvastatin 20 mg  Is the patient currently on ACE/ARB medication?  Yes, Lisinopril 20 mg.  Does the patient have >5 day gap between last estimated fill dates? No  Patients wife stated she does not have any concerns about her medications at this time.   Follow-Up:  Pharmacist Review   Charlann Lange, RMA Clinical Pharmacist Assistant [phone]  10 minutes spent in review, coordination, and documentation.  Reviewed by: Beverly Milch, PharmD Clinical Pharmacist [facility] [phone]

## 2021-02-10 DIAGNOSIS — N189 Chronic kidney disease, unspecified: Secondary | ICD-10-CM | POA: Diagnosis not present

## 2021-02-10 DIAGNOSIS — E210 Primary hyperparathyroidism: Secondary | ICD-10-CM | POA: Diagnosis not present

## 2021-02-10 DIAGNOSIS — R809 Proteinuria, unspecified: Secondary | ICD-10-CM | POA: Diagnosis not present

## 2021-02-10 DIAGNOSIS — D631 Anemia in chronic kidney disease: Secondary | ICD-10-CM | POA: Diagnosis not present

## 2021-02-10 DIAGNOSIS — E1122 Type 2 diabetes mellitus with diabetic chronic kidney disease: Secondary | ICD-10-CM | POA: Diagnosis not present

## 2021-02-10 DIAGNOSIS — D472 Monoclonal gammopathy: Secondary | ICD-10-CM | POA: Diagnosis not present

## 2021-02-10 DIAGNOSIS — N281 Cyst of kidney, acquired: Secondary | ICD-10-CM | POA: Diagnosis not present

## 2021-02-10 DIAGNOSIS — I5032 Chronic diastolic (congestive) heart failure: Secondary | ICD-10-CM | POA: Diagnosis not present

## 2021-02-10 DIAGNOSIS — E1129 Type 2 diabetes mellitus with other diabetic kidney complication: Secondary | ICD-10-CM | POA: Diagnosis not present

## 2021-02-11 ENCOUNTER — Encounter: Payer: Self-pay | Admitting: Internal Medicine

## 2021-02-15 ENCOUNTER — Other Ambulatory Visit: Payer: Self-pay | Admitting: Family Medicine

## 2021-02-17 ENCOUNTER — Encounter: Payer: Self-pay | Admitting: Internal Medicine

## 2021-02-17 ENCOUNTER — Ambulatory Visit (INDEPENDENT_AMBULATORY_CARE_PROVIDER_SITE_OTHER): Payer: Medicare Other | Admitting: Internal Medicine

## 2021-02-17 ENCOUNTER — Other Ambulatory Visit: Payer: Self-pay

## 2021-02-17 VITALS — BP 129/84 | HR 90 | Resp 18 | Ht 72.0 in

## 2021-02-17 DIAGNOSIS — J449 Chronic obstructive pulmonary disease, unspecified: Secondary | ICD-10-CM | POA: Insufficient documentation

## 2021-02-17 DIAGNOSIS — R109 Unspecified abdominal pain: Secondary | ICD-10-CM

## 2021-02-17 DIAGNOSIS — D839 Common variable immunodeficiency, unspecified: Secondary | ICD-10-CM | POA: Insufficient documentation

## 2021-02-17 DIAGNOSIS — M1990 Unspecified osteoarthritis, unspecified site: Secondary | ICD-10-CM | POA: Insufficient documentation

## 2021-02-17 DIAGNOSIS — G8929 Other chronic pain: Secondary | ICD-10-CM

## 2021-02-17 NOTE — Progress Notes (Signed)
New Patient Office Visit  Subjective:  Patient ID: [name], male    DOB: Oct 05, 1946  Age: 75 y.o. MRN: [id number]  CC:  Chief Complaint  Patient presents with  . New Patient (Initial Visit)    New patient has been having a lot of trouble out of pains in the stomach has went to kidney specialist and gastro     HPI [name] presents for establishing care. His wife is present during the visit.  Medications were discussed in detail. Among current medications, patient has been taking Percocet on chronic basis for abdominal pain. Chart review suggests that he had workup for abdominal pain, and was later continued on chronic pain medications. I offered the patient a referral to pain specialist, as we do not offer chronic pain management at the level patient has been prescribed pain medications. Patient and his wife are looking for a primary care who does pain management. She asks which services we provide in general and they were informed of the medical conditions that are treated in the practice and the limitations in general. She states that the patient has specialist for DM, GI and CKD. She asks which specialists they can stop seeing if patient started seeing me as PCP. I explained that it would be unethical for me to advise to stop seeing a specialist provider that he had been already seeing.  Past Medical History:  Diagnosis Date  . Arthritis   . Asthma    "hx of"  . Barrett's esophagus    EGD 03/23/2011 & EGD 2/09 bx proven  . BPH (benign prostatic hyperplasia)   . Cancer of parotid gland ([place]) 11/23/12   Adenocarcinoma  . Chronic abdominal pain   . Chronic constipation   . Colon polyp 03/23/2011   tubular adenoma, Dr. Gala Romney  . 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 ([place]) 07/15/2011  . CVA (cerebral infarction) 1998   right sided deficit  . Delayed gastric emptying 2018  . Diverticulosis    TCS 03/23/11 pancolonic diverticula &TCS 5/08, pancolonic diverticula   . DM (diabetes mellitus) ([place])   . Edema of lower extremity 12/21/12   bilateral   . Esotropia of left eye   . GERD (gastroesophageal reflux disease)   . Glaucoma (increased eye pressure)   . Gout   . Gout   . Hemorrhagic colitis 06/06/2012.  [name] Hemorrhoids, internal 03/23/2011   tcs by Dr. Gala Romney  . Hepatitis    esosiniphilic, tx with prednisone  . Hiatal hernia   . History of radiation therapy 05/21/18- 05/30/18   Left Lung/ 54 Gy delivered in 3 fractions of 18 Gy. SBRT  . HTN (hypertension)   . Hx of radiation therapy 1974   right base of skull area-lymphoma  . Hyperlipidemia   . Lower facial weakness    Right  . Lymphoma ([place]) 1974   XRT at [facility], right base of skull area  . Neuropathy   . Non-small cell lung cancer ([facility]) dx'd 04/26/18  . Peripheral edema    R>L legs  . Rash    chronic, recurrent, R>L legs  . Renal insufficiency   . Steatohepatitis    liver biopsy 2009  . Stroke [facility]) 1998   right hemiparesis/plegia    Past Surgical History:  Procedure Laterality Date  . BIOPSY  12/01/2016   Procedure: BIOPSY;  Surgeon: Daneil Dolin, MD;  Location: [facility];  Service: Endoscopy;;  duodenum, gastric, esophagus  . CHOLECYSTECTOMY    .  COLONOSCOPY  03/23/11   Dr. Gala Romney  pancolonic diverticula, hemorrhoids, tubular adenoma.. next tcs 03/2016  . COLONOSCOPY WITH PROPOFOL N/A 12/01/2016   inadequate bowel prep precluded exam  . ESOPHAGOGASTRODUODENOSCOPY  02/05/08   goblet cell metaplasia/negative for H.pylori  . ESOPHAGOGASTRODUODENOSCOPY  03/23/11   Dr. Gala Romney, barretts, hiatal hernia  . ESOPHAGOGASTRODUODENOSCOPY (EGD) WITH PROPOFOL N/A 12/01/2016   Dr. Gala Romney: Large amount of retained gastric contents precluded completion of the stomach. Mucosal changes were found in the stomach. Erosions and somewhat scalloped appearing mucosa present, reactive gastritis/no H pylori. Barrett's esophagus noted, no dysplasia on biopsy. Duodenal biopsies taken as well, benign, no evidence  of eosinophilia.  . IR FLUORO GUIDED NEEDLE PLC ASPIRATION/INJECTION LOC  12/13/2018  . IR KYPHO LUMBAR INC FX REDUCE BONE BX UNI/BIL CANNULATION INC/IMAGING  12/13/2018  . IR RADIOLOGY PERIPHERAL GUIDED IV START  12/13/2018  . IR US GUIDE VASC ACCESS LEFT  12/13/2018  . MASS BIOPSY  11/01/2012   Procedure: NECK MASS BIOPSY;  Surgeon: Ascencion Dike, MD;  Location: [facility] OR[facility];  Service: ENT;  Laterality: Right;  Excisional Bx Right Neck Mass; attempted external jugular cutdown of left side  . PAROTIDECTOMY  11/24/2012   Procedure: PAROTIDECTOMY;  Surgeon: Ascencion Dike, MD;  Location: [place];  Service: ENT;  Laterality: N/A;  Total parotidectomy  . PLEURECTOMY    . right lymph node removal Right    behind right ear  . Right video-assisted thoracic surgery, pleurectomy, and pleurodesis  2011  . VIDEO BRONCHOSCOPY WITH ENDOBRONCHIAL NAVIGATION N/A 04/26/2018   Procedure: VIDEO BRONCHOSCOPY WITH ENDOBRONCHIAL NAVIGATION;  Surgeon: Melrose Nakayama, MD;  Location: [facility];  Service: Thoracic;  Laterality: N/A;    Family History  Problem Relation Age of Onset  . Heart failure Mother   . Heart failure Father   . Heart failure Sister   . Heart failure Son   . Colon cancer Neg Hx     Social History   Socioeconomic History  . Marital status: Married    Spouse name: Not on file  . Number of children: 1  . Years of education: Not on file  . Highest education level: Not on file  Occupational History  . Occupation: disabled  Tobacco Use  . Smoking status: Former Smoker    Packs/day: 3.00    Years: 25.00    Pack years: 75.00    Types: Cigarettes    Quit date: 03/01/1997    Years since quitting: 23.9  . Smokeless tobacco: Never Used  Vaping Use  . Vaping Use: Never used  Substance and Sexual Activity  . Alcohol use: No  . Drug use: No  . Sexual activity: Not Currently  Other Topics Concern  . Not on file  Social History Narrative  . Not on file   Social Determinants of Health   Financial  Resource Strain: Low Risk   . Difficulty of Paying Living Expenses: Not very hard  Food Insecurity: Not on file  Transportation Needs: Not on file  Physical Activity: Not on file  Stress: Not on file  Social Connections: Not on file  Intimate Partner Violence: Not on file    ROS Review of Systems  Reason unable to perform ROS: deferred.    Objective:   Today's Vitals: BP 129/84 (BP Location: Left Arm, Patient Position: Sitting, Cuff Size: Normal)   Pulse 90   Resp 18   Ht 6' (1.829 m)   SpO2 97%   BMI 33.91 kg/m  Physical Exam Deferred.  Assessment & Plan:   Patient is on Percocet q4h for chronic abdominal pain. Patient and his wife are looking for a PCP who can offer pain management. I explained to them that we do not offer pain management above certain limits. They have been informed of local resources to continue their care. Patient will not be charged for today's visit.  Outpatient Encounter Medications as of 02/17/2021  Medication Sig  . allopurinol (ZYLOPRIM) 300 MG tablet TAKE 1 TABLET(300 MG) BY MOUTH DAILY  . amLODipine (NORVASC) 10 MG tablet TAKE 1 TABLET(10 MG) BY MOUTH DAILY  . cloNIDine (CATAPRES) 0.2 MG tablet TAKE 1 TABLET(0.2 MG) BY MOUTH THREE TIMES DAILY  . diclofenac sodium (VOLTAREN) 1 % GEL Apply twice a day for affected area on ankle as needed (Patient taking differently: Apply 4 g topically 2 (two) times daily as needed. Apply twice a day for affected area on ankle as needed)  . fluticasone (FLONASE) 50 MCG/ACT nasal spray Place 2 sprays into both nostrils daily.  . furosemide (LASIX) 40 MG tablet Take 1 tablet (40 mg total) by mouth daily. May give additional dose QD PRN for increased edema.  . gabapentin (NEURONTIN) 300 MG capsule TAKE 2 CAPSULES BY MOUTH TWICE DAILY, MAY TAKE AN ADDTIONAL 2 CAPSULES MIDDAY AS NEEDED FOR PAIN  . glucose blood (ONETOUCH VERIO) test strip Use as directed to check Blood Glucose twice daily  . insulin glargine (LANTUS  SOLOSTAR) 100 UNIT/ML Solostar Pen Inject 36 Units into the skin at bedtime.  . iron polysaccharides (NIFEREX) 150 MG capsule Take 1 capsule (150 mg total) by mouth daily.  . Lancets (ONETOUCH DELICA PLUS [id number]) MISC USE TO TEST BLOOD SUGAR FOUR TIMES DAILY (Patient taking differently: 1 Device by Other route in the morning, at noon, in the evening, and at bedtime.)  . lisinopril (ZESTRIL) 20 MG tablet TAKE 1 TABLET(20 MG) BY MOUTH DAILY  . lubiprostone (AMITIZA) 24 MCG capsule TAKE 1 CAPSULE(24 MCG) BY MOUTH TWICE DAILY WITH A MEAL  . metoprolol succinate (TOPROL-XL) 50 MG 24 hr tablet TAKE 1 TABLET BY MOUTH EVERY DAY WITH OR IMMEDIATELY FOLLOWING A MEAL  . nystatin (MYCOSTATIN/NYSTOP) powder Apply topically 3 (three) times daily.  [name] oxyCODONE-acetaminophen (PERCOCET) 7.5-325 MG tablet Take 1 tablet by mouth every 4 (four) hours as needed.  . pantoprazole (PROTONIX) 40 MG tablet TAKE 1 TABLET(40 MG) BY MOUTH DAILY  . simvastatin (ZOCOR) 20 MG tablet TAKE 1 TABLET(20 MG) BY MOUTH AT BEDTIME  . tamsulosin (FLOMAX) 0.4 MG CAPS capsule TAKE 1 CAPSULE(0.4 MG) BY MOUTH DAILY  . tiZANidine (ZANAFLEX) 2 MG tablet TAKE 1 TABLET(2 MG) BY MOUTH TWICE DAILY AS NEEDED  . Triamcinolone Acetonide (TRIAMCINOLONE 0.1 % CREAM : EUCERIN) CREA Apply 1 application topically 2 (two) times daily as needed. (Patient taking differently: Apply 1 application topically 2 (two) times daily as needed for rash.)   No facility-administered encounter medications on file as of 02/17/2021.    Follow-up: No follow-ups on file.   Lindell Spar, MD

## 2021-02-19 ENCOUNTER — Telehealth: Payer: Self-pay | Admitting: Physician Assistant

## 2021-02-19 NOTE — Telephone Encounter (Signed)
Scheduled appointments per 3/11 sch msg. Spoke to patient's wife who is aware of appointments date and times.

## 2021-02-24 ENCOUNTER — Ambulatory Visit: Payer: Medicare Other | Admitting: Family Medicine

## 2021-02-25 NOTE — Progress Notes (Addendum)
Chronic Care Management Pharmacy Note  03/04/2021 Name:  [name] MRN:  [id number] DOB:  11/18/1946  Subjective: [name] is an 75 y.o. year old male who is a primary patient of Pickard, Cammie Mcgee, MD.  The CCM team was consulted for assistance with disease management and care coordination needs.    Engaged with patient by telephone for follow up visit in response to provider referral for pharmacy case management and/or care coordination services.   Consent to Services:  The patient was given the following information about Chronic Care Management services today, agreed to services, and gave verbal consent: 1. CCM service includes personalized support from designated clinical staff supervised by the primary care provider, including individualized plan of care and coordination with other care providers 2. 24/7 contact phone numbers for assistance for urgent and routine care needs. 3. Service will only be billed when office clinical staff spend 20 minutes or more in a month to coordinate care. 4. Only one practitioner may furnish and bill the service in a calendar month. 5.The patient may stop CCM services at any time (effective at the end of the month) by phone call to the office staff. 6. The patient will be responsible for cost sharing (co-pay) of up to 20% of the service fee (after annual deductible is met). Patient agreed to services and consent obtained.  Patient Care Team: [name], MD as PCP - General (Family Medicine) Gala Romney Cristopher Estimable, MD (Gastroenterology) Edythe Clarity, [facility] as Pharmacist (Pharmacist)  Recent office visits: 01/26/21 [facility]) - no medication changes.  Consider bedside commode to prevent falls  Recent consult visits: 03/03/21 ([place], Oncology) - referred for bloodwork.  Investigative labs drawn.  12/24/19 Ortho No need for the sling patients proximal humerus fracture has healed. No further treatment needed.  No medication changes.   12/22/19 Endo Rayetta Pigg, NP (Video call), DECREASED Insulin Glargine to 36 units subcutaneous daily at bedtime.  11/25/20 Ortho Dr. Alden Hipp a sling to wear to treat his injury check up in 3-4 weeks. No medication changes.   Hospital visits: None in previous 6 months  Objective:  Lab Results  Component Value Date   CREATININE 2.03 (H) 03/03/2021   BUN 25 (H) 03/03/2021   GFRNONAA 34 (L) 03/03/2021   GFRAA 37 (L) 12/16/2020   NA 141 03/03/2021   K 4.5 03/03/2021   CALCIUM 10.2 03/03/2021   CO2 25 03/03/2021   GLUCOSE 158 (H) 03/03/2021    Lab Results  Component Value Date/Time   HGBA1C 6.9 (H) 12/16/2020 09:18 AM   HGBA1C 6.2 (H) 09/02/2020 10:19 AM   HGBA1C 6.7 12/03/2015 12:00 AM   MICROALBUR 123.0 05/12/2020 09:48 AM   MICROALBUR 232.3 05/04/2017 09:31 AM    Last diabetic Eye exam:  Lab Results  Component Value Date/Time   HMDIABEYEEXA No Retinopathy 10/16/2018 12:00 AM    Last diabetic Foot exam: No results found for: HMDIABFOOTEX   Lab Results  Component Value Date   CHOL 138 05/12/2020   HDL 50 05/12/2020   LDLCALC 64 05/12/2020   TRIG 153 (H) 05/12/2020   CHOLHDL 2.8 05/12/2020    Hepatic Function Latest Ref Rng & Units 03/03/2021 12/16/2020 09/02/2020  Total Protein 6.5 - 8.1 g/dL 7.7 6.8 6.7  Albumin 3.5 - 5.0 g/dL 3.2(L) 3.7 -  AST 15 - 41 U/L 9(L) 13 10  ALT 0 - 44 U/L 10 10 6(L)  Alk Phosphatase 38 - 126 U/L 115 152(H) -  Total  Bilirubin 0.3 - 1.2 mg/dL <0.2(L) 0.2 0.4  Bilirubin, Direct <=0.2 mg/dL - - -    Lab Results  Component Value Date/Time   TSH 2.920 12/16/2020 09:18 AM   TSH 2.68 05/12/2020 09:48 AM   FREET4 1.27 12/16/2020 09:18 AM   FREET4 1.3 05/12/2020 09:48 AM    CBC Latest Ref Rng & Units 03/03/2021 09/02/2020 06/10/2020  WBC 4.0 - 10.5 K/uL 9.0 8.5 9.0  Hemoglobin 13.0 - 17.0 g/dL 11.4(L) 12.0(L) 10.9(L)  Hematocrit 39.0 - 52.0 % 37.0(L) 40.5 37.3(L)  Platelets 150 - 400 K/uL 278 362 353    Lab Results  Component  Value Date/Time   VD25OH 59 05/12/2020 09:48 AM   VD25OH 67 10/08/2019 08:29 AM    Clinical ASCVD: Yes  The 10-year ASCVD risk score Mikey Bussing DC Jr., et al., 2013) is: 32.9%   Values used to calculate the score:     Age: 23 years     Sex: Male     Is Non-Hispanic African American: Yes     Diabetic: Yes     Tobacco smoker: No     Systolic Blood Pressure: 993 mmHg     Is BP treated: Yes     HDL Cholesterol: 50 mg/dL     Total Cholesterol: 138 mg/dL    Depression screen [facility] 2/9 02/17/2021 01/26/2021 11/05/2018  Decreased Interest 0 0 0  Down, Depressed, Hopeless 0 0 0  PHQ - 2 Score 0 0 0  Some recent data might be hidden       Social History   Tobacco Use  Smoking Status Former Smoker   Packs/day: 3.00   Years: 25.00   Pack years: 75.00   Types: Cigarettes   Quit date: 03/01/1997   Years since quitting: 24.0  Smokeless Tobacco Never Used   BP Readings from Last 3 Encounters:  03/03/21 126/73  02/17/21 129/84  01/26/21 (!) 148/88   Pulse Readings from Last 3 Encounters:  03/03/21 63  02/17/21 90  01/26/21 80   Wt Readings from Last 3 Encounters:  08/14/20 250 lb (113.4 kg)  03/04/20 248 lb 3.8 oz (112.6 kg)  09/06/19 250 lb (113.4 kg)   BMI Readings from Last 3 Encounters:  02/17/21 33.91 kg/m  12/23/20 33.91 kg/m  11/25/20 33.91 kg/m    Assessment/Interventions: Review of patient past medical history, allergies, medications, health status, including review of consultants reports, laboratory and other test data, was performed as part of comprehensive evaluation and provision of chronic care management services.   SDOH:  (Social Determinants of Health) assessments and interventions performed: Yes   Financial Resource Strain: Low Risk    Difficulty of Paying Living Expenses: Not very hard       CCM Care Plan  No Known Allergies  Medications Reviewed Today     Reviewed by Edythe Clarity, [facility] (Pharmacist) on 03/04/21 at 1418  Med List Status:  <None>   Medication Order Taking? Sig Documenting Provider Last Dose Status Informant  allopurinol (ZYLOPRIM) 300 MG tablet [id number] Yes TAKE 1 TABLET(300 MG) BY MOUTH DAILY [place], [name], MD Taking Active   amLODipine (NORVASC) 10 MG tablet [id number] Yes TAKE 1 TABLET(10 MG) BY MOUTH DAILY [place], [name], MD Taking Active   cloNIDine (CATAPRES) 0.2 MG tablet [id number] Yes TAKE 1 TABLET(0.2 MG) BY MOUTH THREE TIMES DAILY [place], [name], MD Taking Active   diclofenac sodium (VOLTAREN) 1 % GEL [id number] Yes Apply twice a day for affected area on ankle as needed  Patient taking differently: Apply 4 g topically 2 (two) times daily as needed. Apply twice a day for affected area on ankle as needed   [place], [name], MD Taking Active Spouse/Significant Other  fluticasone (FLONASE) 50 MCG/ACT nasal spray [id number] Yes Place 2 sprays into both nostrils daily. [place], [name], MD Taking Active   furosemide (LASIX) 40 MG tablet [id number] Yes Take 1 tablet (40 mg total) by mouth daily. May give additional dose QD PRN for increased edema. [place], [name], MD Taking Active   gabapentin (NEURONTIN) 300 MG capsule [id number] Yes TAKE 2 CAPSULES BY MOUTH TWICE DAILY, MAY TAKE AN ADDTIONAL 2 CAPSULES MIDDAY AS NEEDED FOR PAIN [place], [name], MD Taking Active   glucose blood [facility] VERIO) test strip [id number] Yes Use as directed to check Blood Glucose twice daily Brita Romp, NP Taking Active   insulin glargine (LANTUS SOLOSTAR) 100 UNIT/ML Solostar Pen [id number] Yes Inject 36 Units into the skin at bedtime. Brita Romp, NP Taking Active   iron polysaccharides (NIFEREX) 150 MG capsule [id number] Yes Take 1 capsule (150 mg total) by mouth daily. [place] Manis, [name], MD Taking Active   Lancets (ONETOUCH DELICA PLUS [id number]) [place] [id number] Yes USE TO TEST BLOOD SUGAR FOUR TIMES DAILY  Patient taking differently: 1 Device by Other route in the morning, at noon, in the evening, and at  bedtime.   [place] Anger, MD Taking Active   lisinopril (ZESTRIL) 20 MG tablet [id number] Yes TAKE 1 TABLET(20 MG) BY MOUTH DAILY [place], [name], MD Taking Active   lubiprostone (AMITIZA) 24 MCG capsule [id number] Yes TAKE 1 CAPSULE(24 MCG) BY MOUTH TWICE DAILY WITH A MEAL [place], [name], MD Taking Active   metoprolol succinate (TOPROL-XL) 50 MG 24 hr tablet [id number] Yes TAKE 1 TABLET BY MOUTH EVERY DAY WITH OR IMMEDIATELY FOLLOWING A MEAL [place], [name], MD Taking Active   nystatin (MYCOSTATIN/NYSTOP) powder [id number] Yes Apply topically 3 (three) times daily. [place], [name], MD Taking Active   oxyCODONE-acetaminophen (PERCOCET) 7.5-325 MG tablet [id number] Yes Take 1 tablet by mouth every 4 (four) hours as needed. [name], MD Taking Active   pantoprazole (PROTONIX) 40 MG tablet [id number] Yes TAKE 1 TABLET(40 MG) BY MOUTH DAILY [place]is, [name], MD Taking Active   simvastatin (ZOCOR) 20 MG tablet [id number] Yes TAKE 1 TABLET(20 MG) BY MOUTH AT BEDTIME [name], MD Taking Active   tamsulosin (FLOMAX) 0.4 MG CAPS capsule [id number] Yes TAKE 1 CAPSULE(0.4 MG) BY MOUTH DAILY [place], [name], MD Taking Active   tiZANidine (ZANAFLEX) 2 MG tablet [id number] Yes TAKE 1 TABLET(2 MG) BY MOUTH TWICE DAILY AS NEEDED [place], [name], MD Taking Active   Triamcinolone Acetonide (TRIAMCINOLONE 0.1 % CREAM : EUCERIN) CREA [id number] Yes Apply 1 application topically 2 (two) times daily as needed.  Patient taking differently: Apply 1 application topically 2 (two) times daily as needed for rash.   [name], MD Taking Active             Patient Active Problem List   Diagnosis Date Noted   MGUS (monoclonal gammopathy of unknown significance) 03/03/2021   Chronic obstructive pulmonary disease ([place]) 02/17/2021   Common variable immunodeficiency ([place]) 02/17/2021   Osteoarthrosis 02/17/2021   Diverticular disease 01/05/2021   History of cerebrovascular accident  01/05/2021   Hiatal hernia 01/05/2021   Hyperlipidemia 01/05/2021   Glaucoma 01/05/2021   Esotropia of left eye 01/05/2021   Benign prostatic hyperplasia 01/05/2021   History  of radiation therapy 01/05/2021   Anemia 01/05/2021   GI bleed 03/18/2020   Acute lower GI bleeding 03/03/2020   Pressure injury of skin 03/03/2020   Thoracic aortic aneurysm without rupture ([place]) 11/19/2019   Hardening of the aorta (main artery of the heart) ([place]) 11/19/2019   Protein-calorie malnutrition ([place]) 06/10/2019   Avitaminosis D 03/07/2019   Male hypogonadism 07/26/2018   Gynecomastia 06/27/2018   Malignant neoplasm of upper lobe of left lung ([place]) 04/06/2018   History of malignant neoplasm of parotid gland 02/07/2018   Adenoma of large intestine 11/09/2016   Adult hypothyroidism 09/09/2015   Chronic constipation 05/22/2015   Impetigo bullosa 10/23/2013   Chronic left shoulder pain 09/25/2013   Muscle weakness (generalized) 09/25/2013   Hypercalcemia 08/08/2013   Chronic kidney disease, stage 3 unspecified ([place]) 08/07/2013   AKI (acute kidney injury) ([place]) 07/24/2013   Acute kidney failure, unspecified ([place]) 07/24/2013   Adhesive capsulitis of left shoulder 05/14/2013   GERD (gastroesophageal reflux disease)    Carcinoma of parotid gland ([place]) 11/23/2012   Epidermoid cyst 10/05/2012   Neck pain 08/26/2012   Chronic abdominal pain 08/26/2012   Edema of lower extremity 08/24/2012   Hemiplegia of dominant side as late effect of cerebrovascular disease ([place]) 06/06/2012   Diarrhea 04/17/2012   Alimentary obesity 04/17/2012   Type 2 diabetes mellitus with diabetic neuropathy, unspecified ([place]) 02/13/2012   Recurrent boils 01/12/2012   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 ([place]) 07/15/2011   Hemorrhoids, internal 03/23/2011   Hepatitis 03/02/2011   Steatohepatitis 03/02/2011   Abdominal pain, right upper quadrant 12/16/2010   Abnormal levels of other serum enzymes 12/16/2010    Eosinophil count raised 12/23/2009   HEMATOCHEZIA 12/23/2009   Type 2 diabetes mellitus ([place]) 01/05/2009   Gout 01/05/2009   Hypertensive disorder 01/05/2009   BARRETTS ESOPHAGUS 01/05/2009   DIVERTICULOSIS OF COLON 01/05/2009   History of malignant lymphoma 01/05/2009    Immunization History  Administered Date(s) Administered   Fluad Quad(high Dose 65+) 09/06/2019, 09/02/2020   Influenza Split 08/24/2012   Influenza, High Dose Seasonal PF 10/31/2017, 10/02/2018   Influenza,inj,Quad PF,6+ Mos 10/09/2013, 09/05/2014, 11/23/2015, 08/16/2016   Moderna Sars-Covid-2 Vaccination 01/19/2020, 02/19/2020, 11/24/2020   PFIZER(Purple Top)SARS-COV-2 Vaccination 11/24/2020   Pneumococcal Conjugate-13 01/07/2014   Pneumococcal Polysaccharide-23 11/25/2012   Tdap 09/18/2019    Conditions to be addressed/monitored:  hypertension, GERD/barretts Esophagus, Type II DM, hypothyroidism, CKD, hyperlipidemia, CVA.  Care Plan : General Pharmacy (Adult)  Updates made by Edythe Clarity, [facility] since 03/04/2021 12:00 AM     Problem: hypertension, GERD/barretts Esophagus, Type II DM, hypothyroidism, CKD, hyperlipidemia, CVA.   Priority: High  Onset Date: 03/04/2021     Long-Range Goal: Patient-Specific Goal   Start Date: 03/04/2021  Expected End Date: 09/04/2021  This Visit's Progress: On track  Priority: High  Note:   Current Barriers:  No specific barriers at this time  Pharmacist Clinical Goal(s):  Patient will achieve adherence to monitoring guidelines and medication adherence to achieve therapeutic efficacy maintain control of blood pressure and blood sugar as evidenced by home monitoring  contact provider office for questions/concerns as evidenced notation of same in electronic health record through collaboration with PharmD and provider.   Interventions: 1:1 collaboration with [name], MD regarding development and update of comprehensive plan of care as evidenced by provider  attestation and co-signature Inter-disciplinary care team collaboration (see longitudinal plan of care) Comprehensive medication review performed; medication list updated in electronic medical record  Hypertension (BP goal <140/90) -Controlled -Current treatment: Amlodipine 32m daily Clonidine 0.239mTID Lisinopril 2028maily Metoprolol XL 67m39mily -Medications previously tried: none noted -Current home readings: 98/79 P70  -  141/100P 91 -Current dietary habits: up and down   -Denies hypotensive/hypertensive symptoms -Educated on BP goals and benefits of medications for prevention of heart attack, stroke and kidney damage; Importance of home blood pressure monitoring; Symptoms of hypotension and importance of maintaining adequate hydration; -Counseled to monitor BP at home daily, document, and provide log at future appointments -Recommended to continue current medication Recommended Contact providers if BP remains elevated above 140/90 consistently.  Diabetes (A1c goal <7%) -Controlled -Current medications: Lantus 100u/ml 36 units at bedtime -Medications previously tried: none noted -Current home glucose readings fasting glucose: 75-85 post prandial glucose: 160 was the highest it has been -Denies hypoglycemic/hyperglycemic symptoms -Current meal patterns:  Some days patient is not eating as much as others -Current exercise: minimal -Educated on A1c and blood sugar goals; Prevention and management of hypoglycemic episodes; Benefits of routine self-monitoring of blood sugar; Confirmed patient taking newly reduced dose of Lantus (36 units) -Counseled to check feet daily and get yearly eye exams -Recommended to continue current medication Some concern for hypoglycemia with patients lack of food intake as well as long acting insulin.  Recently reduced dose of Lantus to 36 units, however patient reports some blood sugars in the low 70s to 80s.  Noting age would consider further  decrease of insulin to see fasting sugars > 90.  Will consult with PCP on proper dosing moving forward.   Patient Goals/Self-Care Activities Patient will:  - take medications as prescribed check glucose daily, document, and provide at future appointments check blood pressure daily, document, and provide at future appointments notify providers of any hypoglycemia.  Follow Up Plan: The care management team will reach out to the patient again over the next 120 days.        Medication Assistance: None required.  Patient affirms current coverage meets needs.  [id number] [place] [facility] [address] [id number]: [phone]: [phone] pill box? Yes -  Organized by wife Pt endorses 100% compliance  We discussed: Benefits of medication synchronization, packaging and delivery as well as enhanced pharmacist oversight with [employer]. Patient decided to: Continue current medication management strategy  Care Plan and Follow Up Patient Decision:  Patient agrees to Care Plan and Follow-up.  Plan: The care management team will reach out to the patient again over the next 120 days.  ChriBeverly MilcharmD Clinical Pharmacist [phone] have collaborated with the care management provider regarding care management and care coordination activities outlined in this encounter and have reviewed this encounter including documentation in the note and care plan. I am certifying that I agree with the content of this note and encounter as supervising physician.

## 2021-03-02 ENCOUNTER — Other Ambulatory Visit: Payer: Self-pay | Admitting: Physician Assistant

## 2021-03-02 DIAGNOSIS — C3412 Malignant neoplasm of upper lobe, left bronchus or lung: Secondary | ICD-10-CM

## 2021-03-02 NOTE — Progress Notes (Signed)
[place] [phone]   [phone]  CONSULT NOTE  REFERRING PHYSICIAN: Dr. Theador Hawthorne  REASON FOR CONSULTATION:  MGUS  HPI [name] is a 75 y.o. male with multiple medical problems including AKI/CKD, DM, hypertension, possible lymphoma (unclear when diagnosed), iron deficiency anemia, parotid gland carcinoma, questionable CHF per patient/wife, GERD, GI bleed 2021, history of stroke in 1998, gout, COPD, and Barrett's esophagitis is referred to the clinic for MGUS. Most of the history was obtained by his wife, [name].   The patient was recently seen by his nephrologist, Dr. Theador Hawthorne, on 02/10/21 for a follow up visit of his CKD.  The patient was undergoing a work-up for his CKD which included a urine protein electrophoresis with immunofixation which showed a IgG kappa monoclonal protein detected.  His CMP demonstrates a creatinine elevated at 1.54.  Albumin low at 3.1.  Low sodium at 132.  Normal calcium level at 9.8.  Normal alk phos level at 102.  His CBC shows a normal white blood cell count at 8.5.  His platelet count is within normal limits at 314 K.  His he has slight anemia with a hemoglobin of 11.  He had slight iron deficiency with a total iron of 44, ferritin of 62, TIBC at 249, and a low saturation ratio at 18%.  He was instructed to start taking oral iron supplements and was referred to gastroenterology and has an appointment with them on 03/05/21. He follows with Dr. Gala Romney from gastroenterology in [place].  He denies any abnormal bleeding or bruising to his knowledge.  The patient also had a CT scan of the abdomen performed in March 2021, per chart review, which did not abnormalities except for left renal cyst.  The patient was subsequently referred to the clinic today for the monoclonal protein detected in his urine.  Of note, the patient was seen in the clinic started 2019 for non-small cell lung cancer. He was lost to medical oncology but he  is followed by Dr. Isidore Moos in radiation oncology and and received radiation to the left lung on 05/30/2018. His next follow up with her is scheduled for September 2021.   Overall, the patient is feeling "alright". He denies significant fatigue. Denies fever or frequent infections except he sometimes gets skin infections in his groin/near rectum due to incontinence. He uses barrier cream to prevent skin breakdown. He also gets what sounds like is folliculitis on his back occasionally. He denies night sweats. Denies recent weight loss but estimates he lost weight gradually over the last few years. They are unable to quantify as they do not weight him. He denies appetite changes or early satiety. He does have constipation which is exacerbated by his supplemental iron use. He has chronic abdominal pain for which it appears he is prescribed percocet for per chart review. As previously mentioned, he is following up with GI later this week. He states he has not had a bowel movement in about a week. He is unsure if he has been taking stool softener. Denies laxative use. He denies lymphadenopathy. Denies abnormal bleeding or bruising. Denies history of fracture except for a fracture in his arm after a fall/injury.  Denies headaches, visual changes, or peripheral neuropathy.  Regarding his anemia, the patient is unsure how long he has been anemic but believes this started a few years ago. The oldest records available to me are from 2008. He did not start showing evidence of anemia until 2019. He had pretty  significant anemia in 2021 in which his Hgb ranged from 7-10.5. During that time, the patient was hospitalized for rectal bleeding. It does not appear he had a colonoscopy to future evaluate this upon discharge. He also has prior labs showing some iron deficiency. He has never needed a blood transfusion or iron infusion to his knowledge. He has been taking prescription iron supplements for about 1 year which he is  compliant with. Denies history of bariatric surgery. He denies changes in his exercise tolerance. He denies lightheadedness. He denies shortness of breath,chest pain or palpitations. He does notcrave ice. He denies any particular dietary habits such as being a vegan or vegetarian. He estimate that he eats red meat 1x per week.   His family history consists of a mother who lived to be 29 and passed away secondary to what sounds like is an aortic dissection. His mother also had hypertension. His father passed away due to a myocardial infarction. His sister has an elarged heart. His son also passed away due to an enlarged heart. His other son has iron deficiency as well on unclear etiology.   The patient used to work at [employer]. He is married to his second wife. He had two sons from his first marriage. His one son passed away. He used to smoke 3 ppd for approximately 40 years. He quit in 1998 when he had a stroke. He also used to drink heavily on the weekends but quit. He denies drug use.     HPI  Past Medical History:  Diagnosis Date  . Arthritis   . Asthma    "hx of"  . Barrett's esophagus    EGD 03/23/2011 & EGD 2/09 bx proven  . BPH (benign prostatic hyperplasia)   . Cancer of parotid gland ([place]) 11/23/12   Adenocarcinoma  . Chronic abdominal pain   . Chronic constipation   . Colon polyp 03/23/2011   tubular adenoma, Dr. Gala Romney  . 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 ([place]) 07/15/2011  . CVA (cerebral infarction) 1998   right sided deficit  . Delayed gastric emptying 2018  . Diverticulosis    TCS 03/23/11 pancolonic diverticula &TCS 5/08, pancolonic diverticula  . DM (diabetes mellitus) ([place])   . Edema of lower extremity 12/21/12   bilateral   . Esotropia of left eye   . GERD (gastroesophageal reflux disease)   . Glaucoma (increased eye pressure)   . Gout   . Gout   . Hemorrhagic colitis 06/06/2012.  [name] Hemorrhoids, internal 03/23/2011   tcs by Dr. Gala Romney  .  Hepatitis    esosiniphilic, tx with prednisone  . Hiatal hernia   . History of radiation therapy 05/21/18- 05/30/18   Left Lung/ 54 Gy delivered in 3 fractions of 18 Gy. SBRT  . HTN (hypertension)   . Hx of radiation therapy 1974   right base of skull area-lymphoma  . Hyperlipidemia   . Lower facial weakness    Right  . Lymphoma ([place]) 1974   XRT at [facility], right base of skull area  . Neuropathy   . Non-small cell lung cancer ([place]) dx'd 04/26/18  . Peripheral edema    R>L legs  . Rash    chronic, recurrent, R>L legs  . Renal insufficiency   . Steatohepatitis    liver biopsy 2009  . Stroke [facility]) 1998   right hemiparesis/plegia    Past Surgical History:  Procedure Laterality Date  . BIOPSY  12/01/2016   Procedure: BIOPSY;  Surgeon: Daneil Dolin, MD;  Location: [facility] ENDO SUITE;  Service: Endoscopy;;  duodenum, gastric, esophagus  . CHOLECYSTECTOMY    . COLONOSCOPY  03/23/11   Dr. Gala Romney  pancolonic diverticula, hemorrhoids, tubular adenoma.. next tcs 03/2016  . COLONOSCOPY WITH PROPOFOL N/A 12/01/2016   inadequate bowel prep precluded exam  . ESOPHAGOGASTRODUODENOSCOPY  02/05/08   goblet cell metaplasia/negative for H.pylori  . ESOPHAGOGASTRODUODENOSCOPY  03/23/11   Dr. Gala Romney, barretts, hiatal hernia  . ESOPHAGOGASTRODUODENOSCOPY (EGD) WITH PROPOFOL N/A 12/01/2016   Dr. Gala Romney: Large amount of retained gastric contents precluded completion of the stomach. Mucosal changes were found in the stomach. Erosions and somewhat scalloped appearing mucosa present, reactive gastritis/no H pylori. Barrett's esophagus noted, no dysplasia on biopsy. Duodenal biopsies taken as well, benign, no evidence of eosinophilia.  . IR FLUORO GUIDED NEEDLE PLC ASPIRATION/INJECTION LOC  12/13/2018  . IR KYPHO LUMBAR INC FX REDUCE BONE BX UNI/BIL CANNULATION INC/IMAGING  12/13/2018  . IR RADIOLOGY PERIPHERAL GUIDED IV START  12/13/2018  . IR US GUIDE VASC ACCESS LEFT  12/13/2018  . MASS BIOPSY  11/01/2012   Procedure:  NECK MASS BIOPSY;  Surgeon: Ascencion Dike, MD;  Location: [facility] ORS;  Service: ENT;  Laterality: Right;  Excisional Bx Right Neck Mass; attempted external jugular cutdown of left side  . PAROTIDECTOMY  11/24/2012   Procedure: PAROTIDECTOMY;  Surgeon: Ascencion Dike, MD;  Location: [place];  Service: ENT;  Laterality: N/A;  Total parotidectomy  . PLEURECTOMY    . right lymph node removal Right    behind right ear  . Right video-assisted thoracic surgery, pleurectomy, and pleurodesis  2011  . VIDEO BRONCHOSCOPY WITH ENDOBRONCHIAL NAVIGATION N/A 04/26/2018   Procedure: VIDEO BRONCHOSCOPY WITH ENDOBRONCHIAL NAVIGATION;  Surgeon: Melrose Nakayama, MD;  Location: [facility] OR;  Service: Thoracic;  Laterality: N/A;    Family History  Problem Relation Age of Onset  . Heart failure Mother   . Heart failure Father   . Heart failure Sister   . Heart failure Son   . Colon cancer Neg Hx     Social History Social History   Tobacco Use  . Smoking status: Former Smoker    Packs/day: 3.00    Years: 25.00    Pack years: 75.00    Types: Cigarettes    Quit date: 03/01/1997    Years since quitting: 24.0  . Smokeless tobacco: Never Used  Vaping Use  . Vaping Use: Never used  Substance Use Topics  . Alcohol use: No  . Drug use: No    No Known Allergies  Current Outpatient Medications  Medication Sig Dispense Refill  . allopurinol (ZYLOPRIM) 300 MG tablet TAKE 1 TABLET(300 MG) BY MOUTH DAILY 90 tablet 1  . amLODipine (NORVASC) 10 MG tablet TAKE 1 TABLET(10 MG) BY MOUTH DAILY 90 tablet 1  . cloNIDine (CATAPRES) 0.2 MG tablet TAKE 1 TABLET(0.2 MG) BY MOUTH THREE TIMES DAILY 270 tablet 2  . diclofenac sodium (VOLTAREN) 1 % GEL Apply twice a day for affected area on ankle as needed (Patient taking differently: Apply 4 g topically 2 (two) times daily as needed. Apply twice a day for affected area on ankle as needed) 100 g 1  . fluticasone (FLONASE) 50 MCG/ACT nasal spray Place 2 sprays into both nostrils daily. 16  g 2  . furosemide (LASIX) 40 MG tablet Take 1 tablet (40 mg total) by mouth daily. May give additional dose QD PRN for increased edema. 90 tablet 2  . gabapentin (  NEURONTIN) 300 MG capsule TAKE 2 CAPSULES BY MOUTH TWICE DAILY, MAY TAKE AN ADDTIONAL 2 CAPSULES MIDDAY AS NEEDED FOR PAIN 540 capsule 3  . glucose blood (ONETOUCH VERIO) test strip Use as directed to check Blood Glucose twice daily 150 strip 5  . insulin glargine (LANTUS SOLOSTAR) 100 UNIT/ML Solostar Pen Inject 36 Units into the skin at bedtime. 15 mL 2  . iron polysaccharides (NIFEREX) 150 MG capsule Take 1 capsule (150 mg total) by mouth daily. 90 capsule 3  . Lancets (ONETOUCH DELICA PLUS [id number]) MISC USE TO TEST BLOOD SUGAR FOUR TIMES DAILY (Patient taking differently: 1 Device by Other route in the morning, at noon, in the evening, and at bedtime.) 200 each 2  . lisinopril (ZESTRIL) 20 MG tablet TAKE 1 TABLET(20 MG) BY MOUTH DAILY 90 tablet 2  . lubiprostone (AMITIZA) 24 MCG capsule TAKE 1 CAPSULE(24 MCG) BY MOUTH TWICE DAILY WITH A MEAL 90 capsule 1  . metoprolol succinate (TOPROL-XL) 50 MG 24 hr tablet TAKE 1 TABLET BY MOUTH EVERY DAY WITH OR IMMEDIATELY FOLLOWING A MEAL 90 tablet 0  . nystatin (MYCOSTATIN/NYSTOP) powder Apply topically 3 (three) times daily. 60 g 2  . oxyCODONE-acetaminophen (PERCOCET) 7.5-325 MG tablet Take 1 tablet by mouth every 4 (four) hours as needed. 180 tablet 0  . pantoprazole (PROTONIX) 40 MG tablet TAKE 1 TABLET(40 MG) BY MOUTH DAILY 90 tablet 2  . simvastatin (ZOCOR) 20 MG tablet TAKE 1 TABLET(20 MG) BY MOUTH AT BEDTIME 90 tablet 2  . tamsulosin (FLOMAX) 0.4 MG CAPS capsule TAKE 1 CAPSULE(0.4 MG) BY MOUTH DAILY 90 capsule 2  . tiZANidine (ZANAFLEX) 2 MG tablet TAKE 1 TABLET(2 MG) BY MOUTH TWICE DAILY AS NEEDED 180 tablet 2  . Triamcinolone Acetonide (TRIAMCINOLONE 0.1 % CREAM : EUCERIN) CREA Apply 1 application topically 2 (two) times daily as needed. (Patient taking differently: Apply 1 application  topically 2 (two) times daily as needed for rash.) 1 each 2   No current facility-administered medications for this visit.    REVIEW OF SYSTEMS:   Review of Systems  Constitutional: Negative for appetite change, chills, fatigue, fever and unexpected weight change.  HENT: Negative for mouth sores, nosebleeds, sore throat and trouble swallowing.   Eyes: Negative for eye problems and icterus.  Respiratory: Negative for cough, hemoptysis, shortness of breath and wheezing.   Cardiovascular: Positive for baseline lower extremity swelling. Negative for chest pain.  Gastrointestinal: Positive for constipation. Negative for abdominal pain, diarrhea, nausea and vomiting.  Genitourinary: Negative for bladder incontinence, difficulty urinating, dysuria, frequency and hematuria.   Musculoskeletal: Negative for back pain, gait problem, neck pain and neck stiffness.  Skin: Negative for itching and rash.  Neurological: Negative for dizziness, extremity weakness, gait problem, headaches, light-headedness and seizures.  Hematological: Negative for adenopathy. Does not bruise/bleed easily.  Psychiatric/Behavioral: Negative for confusion, depression and sleep disturbance. The patient is not nervous/anxious.     PHYSICAL EXAMINATION:  Blood pressure 126/73, pulse 63, temperature 97.9 F (36.6 C), temperature source Tympanic, resp. rate 16, SpO2 100 %.  ECOG PERFORMANCE STATUS: 2 - Symptomatic, <50% confined to bed  Physical Exam  Constitutional: Oriented to person, place, and time and chronically ill appearing and in no distress.   HENT:  Head: Normocephalic and atraumatic.  Mouth/Throat: Oropharynx is clear and moist. No oropharyngeal exudate.  Eyes: Conjunctivae are normal. Right eye exhibits no discharge. Left eye exhibits no discharge. No scleral icterus.  Neck: Normal range of motion. Neck supple.  Cardiovascular: Normal rate,  regular rhythm, normal heart sounds and intact distal pulses.    Pulmonary/Chest: Effort normal and breath sounds normal. No respiratory distress. No wheezes. No rales.  Abdominal: Soft. Bowel sounds are normal. Exhibits no distension and no mass. There is no tenderness.  Musculoskeletal: positive for bilateral lower extremity edema. Hemiplegia noted.  Lymphadenopathy:    No cervical adenopathy.  Neurological: Alert and oriented to person, place, and time. Exhibits muscle wasting. Examined in the wheelchair.  Skin: Skin is warm and dry. No rash noted. Not diaphoretic. No erythema. No pallor.  Psychiatric: Mood, memory and judgment normal.  Vitals reviewed.  LABORATORY DATA: Lab Results  Component Value Date   WBC 9.0 03/03/2021   HGB 11.4 (L) 03/03/2021   HCT 37.0 (L) 03/03/2021   MCV 72.3 (L) 03/03/2021   PLT 278 03/03/2021      Chemistry      Component Value Date/Time   NA 141 03/03/2021 1301   NA 143 12/16/2020 0918   K 4.5 03/03/2021 1301   K 4.3 08/16/2011 0957   CL 106 03/03/2021 1301   CO2 25 03/03/2021 1301   BUN 25 (H) 03/03/2021 1301   BUN 28 (H) 12/16/2020 0918   BUN 23 08/16/2011 0957   CREATININE 2.03 (H) 03/03/2021 1301   CREATININE 1.58 (H) 09/02/2020 1019      Component Value Date/Time   CALCIUM 10.2 03/03/2021 1301   CALCIUM 10.3 08/16/2011 0957   CALCIUM 11.4 (H) 08/12/2011 1349   ALKPHOS 115 03/03/2021 1301   ALKPHOS 80 08/16/2011 0957   AST 9 (L) 03/03/2021 1301   ALT 10 03/03/2021 1301   BILITOT <0.2 (L) 03/03/2021 1301       RADIOGRAPHIC STUDIES: No results found.  ASSESSMENT: This is a very pleasant 75 year old African American male with a prior history of NSCLC and MGUS but cannot exclude multiple myeloma at this point.    PLAN: The patient was seen with Dr. Julien Nordmann today.  The patient had several lab studies performed including CBC, CMP, LDH,  serum kappa/lambda light chains, quantitative immunoglobulins, and beta-2 microglobulin. He also had iron studies and ferritin checked due to his prior anemia  which showed a normal ferritin at 106 and mild iron deficiency with a total iron of 35 and saturation ratio of 13%.  His CBC shows mild stable microcytic anemia with a Hbg of 11.4 and MCV of 72.3. His RDW is elevated at 19.8. His WBC is WNL at 9.0. His CMP continues to show CKD. His myeloma panel is still pending.   Dr. Julien Nordmann recommends that we wait for the results of the myeloma panel. If suspicious for multiple myeloma, we will likely arrange for a bone marrow biopsy and aspirate. If the myeloma panel is not suspicious for multiple myeloma, we will arrange for a repeat myeloma panel in 3-6 months.   I will personally call the patient with the plan once I have all the lab results from today.   In the meantime, he will follow up with GI later this week as planned for his mild anemia and chronic abdominal pain. He was instructed to continue taking his prescription iron supplement. Discussed this may exacerbate constipation so he was advised to take a stool softener to prevention constipation. Since his last bowel movement was ~1 week ago, I advised him to pick up a laxative and take it upon returning home. Discussed the goal is to have a bowel movement daily or every other day.   The patient voices  understanding of current disease status and treatment options and is in agreement with the current care plan.  All questions were answered. The patient knows to call the clinic with any problems, questions or concerns. We can certainly see the patient much sooner if necessary.  Thank you so much for allowing me to participate in the care of [name]. I will continue to follow up the patient with you and assist in his care.  We spent 40-54 minutes in this encounter.   Disclaimer: This note was dictated with voice recognition software. Similar sounding words can inadvertently be transcribed and may not be corrected upon review.   Jaquari Reckner L Lindalee Huizinga March 03, 2021, 3:46  PM  ADDENDUM: Hematology/Oncology Attending: I had a face-to-face encounter with the patient today.  I reviewed his record and recommended his care plan.  This is a very pleasant 75 years old African-American male who has a history of parotid gland carcinoma as well as stage I non-small cell lung cancer treated with SBRT and followed by Dr. Isidore Moos.  He has been followed by scan by Dr. Isidore Moos and no evidence for disease recurrence.  He was seen recently by his nephrologist for evaluation of his renal insufficiency and work-up including urine protein electrophoresis with immunofixation showed elevated IgG kappa monoclonal protein.  The patient was referred to [place] today for evaluation and to rule out underlying multiple myeloma. The patient is feeling fine today with no concerning complaints except for fatigue. We order several studies today for evaluation of his monoclonal gammopathy including repeat CBC, comprehensive metabolic panel, LDH, serum light chain as well as quantitative immunoglobulin and beta-2 microglobulin. We will arrange for the patient a follow-up appointment after the availability of the blood work.  If the blood work is suspicious for any underlying multiple myeloma, may consider The patient for bone marrow biopsy and aspirate to confirm his diagnosis.  We may also consider him for skeletal bone survey if needed. The patient and his wife agreed to the current plan. He was advised to call immediately if he has any other concerning symptoms in the interval. The total time spent in the appointment was 60 minutes. Disclaimer: This note was dictated with voice recognition software. Similar sounding words can inadvertently be transcribed and may be missed upon review. Eilleen Kempf, MD 03/03/21

## 2021-03-03 ENCOUNTER — Inpatient Hospital Stay: Payer: Medicare Other | Attending: Physician Assistant

## 2021-03-03 ENCOUNTER — Inpatient Hospital Stay: Payer: Medicare Other | Admitting: Physician Assistant

## 2021-03-03 ENCOUNTER — Other Ambulatory Visit: Payer: Self-pay

## 2021-03-03 VITALS — BP 126/73 | HR 63 | Temp 97.9°F | Resp 16

## 2021-03-03 DIAGNOSIS — D472 Monoclonal gammopathy: Secondary | ICD-10-CM | POA: Insufficient documentation

## 2021-03-03 DIAGNOSIS — C3412 Malignant neoplasm of upper lobe, left bronchus or lung: Secondary | ICD-10-CM

## 2021-03-03 DIAGNOSIS — N179 Acute kidney failure, unspecified: Secondary | ICD-10-CM | POA: Insufficient documentation

## 2021-03-03 DIAGNOSIS — R109 Unspecified abdominal pain: Secondary | ICD-10-CM | POA: Diagnosis not present

## 2021-03-03 DIAGNOSIS — N189 Chronic kidney disease, unspecified: Secondary | ICD-10-CM | POA: Diagnosis not present

## 2021-03-03 DIAGNOSIS — E1122 Type 2 diabetes mellitus with diabetic chronic kidney disease: Secondary | ICD-10-CM | POA: Insufficient documentation

## 2021-03-03 DIAGNOSIS — E785 Hyperlipidemia, unspecified: Secondary | ICD-10-CM | POA: Insufficient documentation

## 2021-03-03 DIAGNOSIS — Z923 Personal history of irradiation: Secondary | ICD-10-CM | POA: Insufficient documentation

## 2021-03-03 DIAGNOSIS — Z8719 Personal history of other diseases of the digestive system: Secondary | ICD-10-CM | POA: Insufficient documentation

## 2021-03-03 DIAGNOSIS — K219 Gastro-esophageal reflux disease without esophagitis: Secondary | ICD-10-CM | POA: Diagnosis not present

## 2021-03-03 DIAGNOSIS — I129 Hypertensive chronic kidney disease with stage 1 through stage 4 chronic kidney disease, or unspecified chronic kidney disease: Secondary | ICD-10-CM | POA: Diagnosis not present

## 2021-03-03 DIAGNOSIS — Z9049 Acquired absence of other specified parts of digestive tract: Secondary | ICD-10-CM | POA: Diagnosis not present

## 2021-03-03 DIAGNOSIS — Z8249 Family history of ischemic heart disease and other diseases of the circulatory system: Secondary | ICD-10-CM | POA: Insufficient documentation

## 2021-03-03 DIAGNOSIS — K5900 Constipation, unspecified: Secondary | ICD-10-CM | POA: Diagnosis not present

## 2021-03-03 DIAGNOSIS — N401 Enlarged prostate with lower urinary tract symptoms: Secondary | ICD-10-CM | POA: Insufficient documentation

## 2021-03-03 DIAGNOSIS — G8929 Other chronic pain: Secondary | ICD-10-CM | POA: Insufficient documentation

## 2021-03-03 DIAGNOSIS — M7989 Other specified soft tissue disorders: Secondary | ICD-10-CM | POA: Insufficient documentation

## 2021-03-03 DIAGNOSIS — D509 Iron deficiency anemia, unspecified: Secondary | ICD-10-CM | POA: Diagnosis not present

## 2021-03-03 DIAGNOSIS — Z794 Long term (current) use of insulin: Secondary | ICD-10-CM | POA: Diagnosis not present

## 2021-03-03 DIAGNOSIS — Z79899 Other long term (current) drug therapy: Secondary | ICD-10-CM | POA: Diagnosis not present

## 2021-03-03 DIAGNOSIS — Z8673 Personal history of transient ischemic attack (TIA), and cerebral infarction without residual deficits: Secondary | ICD-10-CM | POA: Diagnosis not present

## 2021-03-03 DIAGNOSIS — Z87891 Personal history of nicotine dependence: Secondary | ICD-10-CM | POA: Diagnosis not present

## 2021-03-03 LAB — CBC WITH DIFFERENTIAL (CANCER CENTER ONLY)
Abs Immature Granulocytes: 0.03 10*3/uL (ref 0.00–0.07)
Basophils Absolute: 0.1 10*3/uL (ref 0.0–0.1)
Basophils Relative: 1 %
Eosinophils Absolute: 0.8 10*3/uL — ABNORMAL HIGH (ref 0.0–0.5)
Eosinophils Relative: 9 %
HCT: 37 % — ABNORMAL LOW (ref 39.0–52.0)
Hemoglobin: 11.4 g/dL — ABNORMAL LOW (ref 13.0–17.0)
Immature Granulocytes: 0 %
Lymphocytes Relative: 12 %
Lymphs Abs: 1.1 10*3/uL (ref 0.7–4.0)
MCH: 22.3 pg — ABNORMAL LOW (ref 26.0–34.0)
MCHC: 30.8 g/dL (ref 30.0–36.0)
MCV: 72.3 fL — ABNORMAL LOW (ref 80.0–100.0)
Monocytes Absolute: 0.5 10*3/uL (ref 0.1–1.0)
Monocytes Relative: 5 %
Neutro Abs: 6.6 10*3/uL (ref 1.7–7.7)
Neutrophils Relative %: 73 %
Platelet Count: 278 10*3/uL (ref 150–400)
RBC: 5.12 MIL/uL (ref 4.22–5.81)
RDW: 19.8 % — ABNORMAL HIGH (ref 11.5–15.5)
WBC Count: 9 10*3/uL (ref 4.0–10.5)
nRBC: 0 % (ref 0.0–0.2)

## 2021-03-03 LAB — IRON AND TIBC
Iron: 35 ug/dL — ABNORMAL LOW (ref 42–163)
Saturation Ratios: 13 % — ABNORMAL LOW (ref 20–55)
TIBC: 259 ug/dL (ref 202–409)
UIBC: 224 ug/dL (ref 117–376)

## 2021-03-03 LAB — CMP (CANCER CENTER ONLY)
ALT: 10 U/L (ref 0–44)
AST: 9 U/L — ABNORMAL LOW (ref 15–41)
Albumin: 3.2 g/dL — ABNORMAL LOW (ref 3.5–5.0)
Alkaline Phosphatase: 115 U/L (ref 38–126)
Anion gap: 10 (ref 5–15)
BUN: 25 mg/dL — ABNORMAL HIGH (ref 8–23)
CO2: 25 mmol/L (ref 22–32)
Calcium: 10.2 mg/dL (ref 8.9–10.3)
Chloride: 106 mmol/L (ref 98–111)
Creatinine: 2.03 mg/dL — ABNORMAL HIGH (ref 0.61–1.24)
GFR, Estimated: 34 mL/min — ABNORMAL LOW (ref >60)
Glucose, Bld: 158 mg/dL — ABNORMAL HIGH (ref 70–99)
Potassium: 4.5 mmol/L (ref 3.5–5.1)
Sodium: 141 mmol/L (ref 135–145)
Total Bilirubin: <0.2 mg/dL — ABNORMAL LOW (ref 0.3–1.2)
Total Protein: 7.7 g/dL (ref 6.5–8.1)

## 2021-03-03 LAB — FERRITIN: Ferritin: 106 ng/mL (ref 24–336)

## 2021-03-03 LAB — LACTATE DEHYDROGENASE: LDH: 133 U/L (ref 98–192)

## 2021-03-03 NOTE — Patient Instructions (Signed)
Monoclonal Gammopathy of Undetermined Significance Monoclonal gammopathy of undetermined significance (MGUS) is a condition in which there is too much of a protein called monoclonal protein, or M protein, in the blood. MGUS can cause you to have too many cells in your blood and not enough space for healthy cells. This condition does not cause symptoms, but it may increase your risk of developing multiple myeloma or other blood disorders in the future. What are the causes? The cause of this condition is not known. Genetics and the environment may play a role. What increases the risk? You are more likely to develop this condition if:  You are African American.  You are age 75 or older.  You are male.  You have an autoimmune disease.  You have been exposed to radiation.  You have a family history of MGUS. What are the signs or symptoms? There are no symptoms of this condition. How is this diagnosed? This condition may be diagnosed with a blood test that checks for M protein.   How is this treated? Treatment may involve monitoring your condition. This may include:  Having regular exams. This will allow your health care provider to monitor your health.  Having tests done regularly, such as: ? Blood tests to check for M protein in your body. ? Imaging tests, such as a CT scan. ? A bone marrow biopsy. This test involves taking a sample of bone marrow from your body so it can be looked at under a microscope. Follow these instructions at home:  Keep all follow-up visits as told by your health care provider. This is important. Contact a health care provider if:  You have trouble swallowing.  You have pain in your back or ribs.  You have a fever.  You are bruising easily. Get help right away if:  You break a bone.  You have trouble breathing. Summary  Monoclonal gammopathy of undetermined significance (MGUS) is a condition in which there is too much of a protein called  monoclonal protein, or M protein, in the blood.  This condition may be diagnosed with a blood test that checks for M protein.  Treatment for this condition may involve having tests done regularly. Tests may include blood tests, imaging tests, and a bone marrow biopsy. This information is not intended to replace advice given to you by your health care provider. Make sure you discuss any questions you have with your health care provider. Document Revised: 10/17/2019 Document Reviewed: 10/17/2019 Elsevier Patient Education  2021 [employer].

## 2021-03-04 ENCOUNTER — Telehealth: Payer: Self-pay | Admitting: Pharmacist

## 2021-03-04 ENCOUNTER — Ambulatory Visit (INDEPENDENT_AMBULATORY_CARE_PROVIDER_SITE_OTHER): Payer: Medicare Other | Admitting: Pharmacist

## 2021-03-04 ENCOUNTER — Other Ambulatory Visit: Payer: Self-pay | Admitting: Nurse Practitioner

## 2021-03-04 DIAGNOSIS — E1122 Type 2 diabetes mellitus with diabetic chronic kidney disease: Secondary | ICD-10-CM | POA: Diagnosis not present

## 2021-03-04 DIAGNOSIS — I10 Essential (primary) hypertension: Secondary | ICD-10-CM | POA: Diagnosis not present

## 2021-03-04 DIAGNOSIS — N182 Chronic kidney disease, stage 2 (mild): Secondary | ICD-10-CM | POA: Diagnosis not present

## 2021-03-04 DIAGNOSIS — Z794 Long term (current) use of insulin: Secondary | ICD-10-CM | POA: Diagnosis not present

## 2021-03-04 LAB — IGG, IGA, IGM
IgA: 291 mg/dL (ref 61–437)
IgG (Immunoglobin G), Serum: 1377 mg/dL (ref 603–1613)
IgM (Immunoglobulin M), Srm: 68 mg/dL (ref 15–143)

## 2021-03-04 LAB — KAPPA/LAMBDA LIGHT CHAINS
Kappa free light chain: 152 mg/L — ABNORMAL HIGH (ref 3.3–19.4)
Kappa, lambda light chain ratio: 2.85 — ABNORMAL HIGH (ref 0.26–1.65)
Lambda free light chains: 53.3 mg/L — ABNORMAL HIGH (ref 5.7–26.3)

## 2021-03-04 LAB — BETA 2 MICROGLOBULIN, SERUM: Beta-2 Microglobulin: 4.8 mg/L — ABNORMAL HIGH (ref 0.6–2.4)

## 2021-03-04 MED ORDER — LANTUS SOLOSTAR 100 UNIT/ML ~~LOC~~ SOPN: 30.0000 [IU] | PEN_INJECTOR | Freq: Every day | SUBCUTANEOUS | 2 refills | Status: DC

## 2021-03-04 NOTE — Patient Instructions (Addendum)
Visit Information  Goals Addressed            This Visit's Progress   . Monitor and Manage My Blood Sugar-Diabetes Type 2       Timeframe:  Long-Range Goal Priority:  High Start Date:     03/04/21                        Expected End Date:        09/04/21               Follow Up Date 06/10/21   - check blood sugar at prescribed times - check blood sugar if I feel it is too high or too low - enter blood sugar readings and medication or insulin into daily log    Why is this important?    Checking your blood sugar at home helps to keep it from getting very high or very low.   Writing the results in a diary or log helps the doctor know how to care for you.   Your blood sugar log should have the time, date and the results.   Also, write down the amount of insulin or other medicine that you take.   Other information, like what you ate, exercise done and how you were feeling, will also be helpful.     Notes:       Patient Care Plan: General Pharmacy (Adult)    Problem Identified: hypertension, GERD/barretts Esophagus, Type II DM, hypothyroidism, CKD, hyperlipidemia, CVA.   Priority: High  Onset Date: 03/04/2021    Long-Range Goal: Patient-Specific Goal   Start Date: 03/04/2021  Expected End Date: 09/04/2021  This Visit's Progress: On track  Priority: High  Note:   Current Barriers:  . No specific barriers at this time  Pharmacist Clinical Goal(s):  [name] Patient will achieve adherence to monitoring guidelines and medication adherence to achieve therapeutic efficacy . maintain control of blood pressure and blood sugar as evidenced by home monitoring  . contact provider office for questions/concerns as evidenced notation of same in electronic health record through collaboration with PharmD and provider.   Interventions: . 1:1 collaboration with [name], MD regarding development and update of comprehensive plan of care as evidenced by provider attestation and  co-signature . Inter-disciplinary care team collaboration (see longitudinal plan of care) . Comprehensive medication review performed; medication list updated in electronic medical record  Hypertension (BP goal <140/90) -Controlled -Current treatment: . Amlodipine 46m daily . Clonidine 0.231mTID . Lisinopril 2060maily . Metoprolol XL 82m66mily -Medications previously tried: none noted -Current home readings: 98/79 P70  -  141/100P 91 -Current dietary habits: up and down   -Denies hypotensive/hypertensive symptoms -Educated on BP goals and benefits of medications for prevention of heart attack, stroke and kidney damage; Importance of home blood pressure monitoring; Symptoms of hypotension and importance of maintaining adequate hydration; -Counseled to monitor BP at home daily, document, and provide log at future appointments -Recommended to continue current medication Recommended Contact providers if BP remains elevated above 140/90 consistently.  Diabetes (A1c goal <7%) -Controlled -Current medications: . Lantus 100u/ml 36 units at bedtime -Medications previously tried: none noted -Current home glucose readings . fasting glucose: 75-85 . post prandial glucose: 160 was the highest it has been -Denies hypoglycemic/hyperglycemic symptoms -Current meal patterns:  . Some days patient is not eating as much as others -Current exercise: minimal -Educated on A1c and blood sugar goals; Prevention and management of  hypoglycemic episodes; Benefits of routine self-monitoring of blood sugar; Confirmed patient taking newly reduced dose of Lantus (36 units) -Counseled to check feet daily and get yearly eye exams -Recommended to continue current medication Some concern for hypoglycemia with patients lack of food intake as well as long acting insulin.  Recently reduced dose of Lantus to 36 units, however patient reports some blood sugars in the low 70s to 80s.  Noting age would consider  further decrease of insulin to see fasting sugars > 90.  Will consult with PCP on proper dosing moving forward.   Patient Goals/Self-Care Activities . Patient will:  - take medications as prescribed check glucose daily, document, and provide at future appointments check blood pressure daily, document, and provide at future appointments notify providers of any hypoglycemia.  Follow Up Plan: The care management team will reach out to the patient again over the next 120 days.        The patient verbalized understanding of instructions, educational materials, and care plan provided today and agreed to receive a mailed copy of patient instructions, educational materials, and care plan.  Telephone follow up appointment with pharmacy team member scheduled for: 4 months  Edythe Clarity, [facility]  Blood Glucose Monitoring, Adult Monitoring your blood sugar (glucose) is an important part of managing your diabetes. Blood glucose monitoring involves checking your blood glucose as often as directed and keeping a log or record of your results over time. Checking your blood glucose regularly and keeping a blood glucose log can:  Help you and your health care provider adjust your diabetes management plan as needed, including your medicines or insulin.  Help you understand how food, exercise, illnesses, and medicines affect your blood glucose.  Let you know what your blood glucose is at any time. You can quickly find out if you have low blood glucose (hypoglycemia) or high blood glucose (hyperglycemia). Your health care provider will set individualized treatment goals for you. Your goals will be based on your age, other medical conditions you have, and how you respond to diabetes treatment. Generally, the goal of treatment is to maintain the following blood glucose levels:  Before meals (preprandial): 80-130 mg/dL (4.4-7.2 mmol/L).  After meals (postprandial): below 180 mg/dL (10 mmol/L).  A1C level:  less than 7%. Supplies needed:  Blood glucose meter.  Test strips for your meter. Each meter has its own strips. You must use the strips that came with your meter.  A needle to prick your finger (lancet). Do not use a lancet more than one time.  A device that holds the lancet (lancing device).  A journal or log book to write down your results. How to check your blood glucose Checking your blood glucose 1. Wash your hands for at least 20 seconds with soap and water. 2. Prick the side of your finger (not the tip) with the lancet. Do not use the same finger consecutively. 3. Gently rub the finger until a small drop of blood appears. 4. Follow instructions that come with your meter for inserting the test strip, applying blood to the strip, and using your blood glucose meter. 5. Write down your result and any notes in your log.   Using alternative sites Some meters allow you to use areas of your body other than your finger (alternative sites) to test your blood. The most common alternative sites are the forearm, the thigh, and the palm of your hand. Alternative sites may not be as accurate as the fingers because blood  flow is slower in those areas. This means that the result you get may be delayed, and it may be different from the result that you would get from your finger. Use the finger only, and do not use alternative sites, if:  You think you have hypoglycemia.  You sometimes do not know that your blood glucose is getting low (hypoglycemia unawareness). General tips and recommendations Blood glucose log  Every time you check your blood glucose, write down your result. Also write down any notes about things that may be affecting your blood glucose, such as your diet and exercise for the day. This information can help you and your health care provider: ? Look for patterns in your blood glucose over time. ? Adjust your diabetes management plan as needed.  Check if your meter allows you  to download your records to a computer or if there is an app for the meter. Most glucose meters store a record of glucose readings in the meter.   If you have type 1 diabetes:  Check your blood glucose 4 or more times a day if you are on intensive insulin therapy with multiple daily injections (MDI) or if you are using an insulin pump. Check your blood glucose: ? Before every meal and snack. ? Before bedtime.  Also check your blood glucose: ? If you have symptoms of hypoglycemia. ? After treating low blood glucose. ? Before doing activities that create a risk for injury, like driving or using machinery. ? Before and after exercise. ? Two hours after a meal. ? Occasionally between 2:00 a.m. and 3:00 a.m., as directed.  You may need to check your blood glucose more often, 6-10 times per day, if: ? You have diabetes that is not well controlled. ? You are ill. ? You have a history of severe hypoglycemia. ? You have hypoglycemia unawareness. If you have type 2 diabetes:  Check your blood glucose 2 or more times a day if you take insulin or other diabetes medicines.  Check your blood glucose 4 or more times a day if you are on intensive insulin therapy. Occasionally, you may also need to check your glucose between 2:00 a.m. and 3:00 a.m., as directed.  Also check your blood glucose: ? Before and after exercise. ? Before doing activities that create a risk for injury, like driving or using machinery.  You may need to check your blood glucose more often if: ? Your medicine is being adjusted. ? Your diabetes is not well controlled. ? You are ill. General tips  Make sure you always have your supplies with you.  After you use a few boxes of test strips, adjust (calibrate) your blood glucose meter by following instructions that came with your meter.  If you have questions or need help, all blood glucose meters have a 24-hour hotline phone number available that you can call. Also contact  your health care provider with questions or concerns you may have. Where to find more information  The American Diabetes Association: www.diabetes.org  The Association of Diabetes Care & Education Specialists: www.diabeteseducator.org Contact a health care provider if:  Your blood glucose is at or above 240 mg/dL (13.3 mmol/L) for 2 days in a row.  You have been sick or have had a fever for 2 days or longer, and you are not getting better.  You have any of the following problems for more than 6 hours: ? You cannot eat or drink. ? You have nausea or vomiting. ? You have  diarrhea. Get help right away if:  Your blood glucose is lower than 54 mg/dL (3 mmol/L).  You become confused, or you have trouble thinking clearly.  You have difficulty breathing.  You have moderate or large ketone levels in your urine. These symptoms may represent a serious problem that is an emergency. Do not wait to see if the symptoms will go away. Get medical help right away. Call your local emergency services (911 in the [place] Do not drive yourself to the hospital. Summary  Monitoring your blood glucose is an important part of managing your diabetes.  Blood glucose monitoring involves checking your blood glucose as often as directed and keeping a log or record of your results over time.  Your health care provider will set individualized treatment goals for you. Your goals will be based on your age, other medical conditions you have, and how you respond to diabetes treatment.  Every time you check your blood glucose, write down your result. Also, write down any notes about things that may be affecting your blood glucose, such as your diet and exercise for the day. This information is not intended to replace advice given to you by your health care provider. Make sure you discuss any questions you have with your health care provider. Document Revised: 08/26/2020 Document Reviewed: 08/26/2020 Elsevier Patient  Education  2021 [employer].

## 2021-03-04 NOTE — Telephone Encounter (Signed)
-----   Message from Brita Romp, NP sent at 03/04/2021  3:44 PM EDT ----- Darrick Meigs,  Thank you for sharing this information with me.  It would do some good to lower his dose of Lantus to 30 units as you recommend to avoid hypoglycemia, especially given his age.  I can update his order on my end to reflect this change.  Rayetta Pigg ----- Message ----- From: Edythe Clarity, [facility] Sent: 03/04/2021   3:35 PM EDT To: Brita Romp, NP  Hey!  I saw patient [name] for CCM today and his wife continues to report fasting sugars between 75-85.  Highest post prandial was 160.  I was wondering your thought on further decreasing his Lantus to 30 units daily to minimize hypoglycemia risk.  Thanks!  Beverly Milch, PharmD, CPP Clinical Pharmacist [place] [phone]

## 2021-03-04 NOTE — Telephone Encounter (Unsigned)
Called patient to inform him of dose change.  Approved by Rayetta Pigg, NP  *** minutes spent in review, coordination, and documentation.  Reviewed by: Beverly Milch, PharmD Clinical Pharmacist [facility] [phone]

## 2021-03-05 ENCOUNTER — Ambulatory Visit: Payer: Medicare Other | Admitting: Internal Medicine

## 2021-03-05 ENCOUNTER — Encounter: Payer: Self-pay | Admitting: Internal Medicine

## 2021-03-05 ENCOUNTER — Telehealth: Payer: Self-pay | Admitting: *Deleted

## 2021-03-05 ENCOUNTER — Other Ambulatory Visit: Payer: Self-pay

## 2021-03-05 VITALS — BP 125/89 | HR 75 | Temp 97.3°F | Ht 73.0 in | Wt 225.0 lb

## 2021-03-05 DIAGNOSIS — K2270 Barrett's esophagus without dysplasia: Secondary | ICD-10-CM

## 2021-03-05 DIAGNOSIS — D126 Benign neoplasm of colon, unspecified: Secondary | ICD-10-CM

## 2021-03-05 NOTE — Telephone Encounter (Signed)
Called [facility] and was advised patient was scheduled with Dr. Newman Pies in June 2021 and Oct 2021 and patient called cancelled both appts. Patient rescheduled to 7/21 at 1:00pm with Dr. Newman Pies, [facility] medical center in [place] run. Patient added to cancellation list for sooner appt as well.   I called spouse and left detailed message on VM with appt information. Also [facility] will be sending another packet in the mail with appt information.

## 2021-03-05 NOTE — Progress Notes (Signed)
Patient presents here in referral from [facility] for anemia and history of GI bleeding.  This gentleman was seen here last year - was hospitalized with rectal bleeding.  He is most complex with multiple comorbidities.  His GI issues include Barrett's esophagus and history of multiple colonic adenomas removed in the distant past and he is significantly overdue for surveillance examination.  We discussed with Dr. Briant Cedar, anesthesia, last year and felt that his complexities warranted tertiary referral care.  We agreed.  Patient was referred to [facility] back in May of last year.  In fact, our documentation shows we referred to [facility]; patient was called by [facility] on May 12 and schedule an appointment for June 17 with Dr. Newman Pies.  Patient was a no-show for that visit. I had a sitdown conversation with [name] today.  The importance of following through on the recommended GI evaluation was emphasized.   I realize it is inconvenient to travel to [facility] but this is best practice for this nice patient.  I have offered to rerefer patient to [facility] ASAP.  I stressed the importance of compliance and following through with the evaluation.  Delaying the evaluation further will only jeopardize his health. Patient's GI care is beyond the scope of our practice. No charge for today's encounter.

## 2021-03-08 ENCOUNTER — Telehealth: Payer: Self-pay | Admitting: Internal Medicine

## 2021-03-08 ENCOUNTER — Other Ambulatory Visit: Payer: Self-pay | Admitting: Physician Assistant

## 2021-03-08 ENCOUNTER — Encounter: Payer: Self-pay | Admitting: Internal Medicine

## 2021-03-08 ENCOUNTER — Telehealth: Payer: Self-pay

## 2021-03-08 DIAGNOSIS — R778 Other specified abnormalities of plasma proteins: Secondary | ICD-10-CM

## 2021-03-08 NOTE — Telephone Encounter (Signed)
I spoke with pts wife and advised per Vito Backers, PA-C he will need a bone marrow bx. I advised for her to expect a call from IR to schedule this exam. Ms. [name] expressed understanding of this information.

## 2021-03-08 NOTE — Telephone Encounter (Signed)
Scheduled appt per 3/28 sch msg. Pt's wife is aware.

## 2021-03-09 ENCOUNTER — Telehealth: Payer: Self-pay

## 2021-03-09 NOTE — Telephone Encounter (Signed)
err

## 2021-03-09 NOTE — Telephone Encounter (Signed)
Pts wife wanted to review the renal US results. I advised her to please contact Dr. Toya Smothers office to review those results as that is the provider that ordered it. She expressed understanding of this information.

## 2021-03-12 ENCOUNTER — Other Ambulatory Visit: Payer: Self-pay | Admitting: Radiology

## 2021-03-13 ENCOUNTER — Other Ambulatory Visit: Payer: Self-pay | Admitting: Family Medicine

## 2021-03-15 ENCOUNTER — Other Ambulatory Visit: Payer: Self-pay

## 2021-03-15 ENCOUNTER — Encounter (HOSPITAL_COMMUNITY): Payer: Self-pay

## 2021-03-15 ENCOUNTER — Ambulatory Visit (HOSPITAL_COMMUNITY)
Admission: RE | Admit: 2021-03-15 | Discharge: 2021-03-15 | Disposition: A | Discharge status: Home / Self Care | Payer: Medicare Other | Source: Ambulatory Visit | Attending: Physician Assistant | Admitting: Physician Assistant

## 2021-03-15 ENCOUNTER — Ambulatory Visit (HOSPITAL_COMMUNITY): Payer: Medicare Other

## 2021-03-15 DIAGNOSIS — D72829 Elevated white blood cell count, unspecified: Secondary | ICD-10-CM | POA: Diagnosis not present

## 2021-03-15 DIAGNOSIS — R778 Other specified abnormalities of plasma proteins: Secondary | ICD-10-CM | POA: Diagnosis not present

## 2021-03-15 DIAGNOSIS — D509 Iron deficiency anemia, unspecified: Secondary | ICD-10-CM | POA: Insufficient documentation

## 2021-03-15 DIAGNOSIS — D72828 Other elevated white blood cell count: Secondary | ICD-10-CM | POA: Diagnosis not present

## 2021-03-15 DIAGNOSIS — C9000 Multiple myeloma not having achieved remission: Secondary | ICD-10-CM | POA: Diagnosis not present

## 2021-03-15 DIAGNOSIS — D72822 Plasmacytosis: Secondary | ICD-10-CM | POA: Diagnosis not present

## 2021-03-15 DIAGNOSIS — Z794 Long term (current) use of insulin: Secondary | ICD-10-CM | POA: Diagnosis not present

## 2021-03-15 LAB — CBC WITH DIFFERENTIAL/PLATELET
Abs Immature Granulocytes: 0.05 10*3/uL (ref 0.00–0.07)
Basophils Absolute: 0.1 10*3/uL (ref 0.0–0.1)
Basophils Relative: 1 %
Eosinophils Absolute: 0.7 10*3/uL — ABNORMAL HIGH (ref 0.0–0.5)
Eosinophils Relative: 7 %
HCT: 41.2 % (ref 39.0–52.0)
Hemoglobin: 12.3 g/dL — ABNORMAL LOW (ref 13.0–17.0)
Immature Granulocytes: 1 %
Lymphocytes Relative: 11 %
Lymphs Abs: 1.1 10*3/uL (ref 0.7–4.0)
MCH: 21.9 pg — ABNORMAL LOW (ref 26.0–34.0)
MCHC: 29.9 g/dL — ABNORMAL LOW (ref 30.0–36.0)
MCV: 73.4 fL — ABNORMAL LOW (ref 80.0–100.0)
Monocytes Absolute: 0.5 10*3/uL (ref 0.1–1.0)
Monocytes Relative: 5 %
Neutro Abs: 8.2 10*3/uL — ABNORMAL HIGH (ref 1.7–7.7)
Neutrophils Relative %: 75 %
Platelets: 285 10*3/uL (ref 150–400)
RBC: 5.61 MIL/uL (ref 4.22–5.81)
RDW: 20.8 % — ABNORMAL HIGH (ref 11.5–15.5)
WBC: 10.7 10*3/uL — ABNORMAL HIGH (ref 4.0–10.5)
nRBC: 0 % (ref 0.0–0.2)

## 2021-03-15 LAB — BASIC METABOLIC PANEL
Anion gap: 10 (ref 5–15)
BUN: 25 mg/dL — ABNORMAL HIGH (ref 8–23)
CO2: 25 mmol/L (ref 22–32)
Calcium: 9.8 mg/dL (ref 8.9–10.3)
Chloride: 104 mmol/L (ref 98–111)
Creatinine, Ser: 1.75 mg/dL — ABNORMAL HIGH (ref 0.61–1.24)
GFR, Estimated: 40 mL/min — ABNORMAL LOW (ref >60)
Glucose, Bld: 99 mg/dL (ref 70–99)
Potassium: 3.8 mmol/L (ref 3.5–5.1)
Sodium: 139 mmol/L (ref 135–145)

## 2021-03-15 LAB — PROTIME-INR
INR: 0.9 (ref 0.8–1.2)
Prothrombin Time: 12 seconds (ref 11.4–15.2)

## 2021-03-15 LAB — GLUCOSE, CAPILLARY
Glucose-Capillary: 90 mg/dL (ref 70–99)
Glucose-Capillary: 81 mg/dL (ref 70–99)

## 2021-03-15 MED ORDER — FENTANYL CITRATE (PF) 100 MCG/2ML IJ SOLN
INTRAMUSCULAR | Status: AC | PRN
  Administered 2021-03-15: 50 ug via INTRAVENOUS

## 2021-03-15 MED ORDER — LIDOCAINE HCL (PF) 1 % IJ SOLN
INTRAMUSCULAR | Status: AC | PRN
  Administered 2021-03-15: 10 mL via INTRADERMAL

## 2021-03-15 MED ORDER — MIDAZOLAM HCL 2 MG/2ML IJ SOLN
INTRAMUSCULAR | Status: AC | PRN
  Administered 2021-03-15 (×2): 1 mg via INTRAVENOUS

## 2021-03-15 MED ORDER — FLUMAZENIL 0.5 MG/5ML IV SOLN
INTRAVENOUS | Status: AC
  Filled 2021-03-15: qty 5

## 2021-03-15 MED ORDER — MIDAZOLAM HCL 2 MG/2ML IJ SOLN
INTRAMUSCULAR | Status: AC
  Filled 2021-03-15: qty 2

## 2021-03-15 MED ORDER — FENTANYL CITRATE (PF) 100 MCG/2ML IJ SOLN
INTRAMUSCULAR | Status: AC
  Filled 2021-03-15: qty 2

## 2021-03-15 MED ORDER — NALOXONE HCL 0.4 MG/ML IJ SOLN
INTRAMUSCULAR | Status: AC
  Filled 2021-03-15: qty 1

## 2021-03-15 MED ORDER — SODIUM CHLORIDE 0.9 % IV SOLN: INTRAVENOUS | Status: DC

## 2021-03-15 NOTE — Discharge Instructions (Signed)
 Interventional radiology phone numbers [phone] After hours [phone]  Bone Marrow Aspiration and Bone Marrow Biopsy, Adult, Care After This sheet gives you information about how to care for yourself after your procedure. Your health care provider may also give you more specific instructions. If you have problems or questions, contact your health care provider. What can I expect after the procedure? After the procedure, it is common to have:  Mild pain and tenderness.  Swelling.  Bruising. Follow these instructions at home: Puncture site care 1. Follow instructions from your health care provider about how to take care of the puncture site. Make sure you: ? Wash your hands with soap and water before and after you change your bandage (dressing). If soap and water are not available, use hand sanitizer.  2. Check your puncture site every day for signs of infection. Check for: ? More redness, swelling, or pain. ? Fluid or blood. ? Warmth. ? Pus or a bad smell.   Activity 1. Return to your normal activities as told by your health care provider. Ask your health care provider what activities are safe for you. 2. Do not lift anything that is heavier than 10 lb (4.5 kg), or the limit that you are told, until your health care provider says that it is safe. 3. Do not drive for 24 hours if you were given a sedative during your procedure. General instructions 1. Take over-the-counter and prescription medicines only as told by your health care provider. 2. Do not take baths, swim, or use a hot tub until your health care provider approves. You may remove your dressing 03/13/21 and shower around 10 AM. 3. Put ice on the affected area. To do this: ? Put ice in a plastic bag. ? Place a towel between your skin and the bag. ? Leave the ice on for 20 minutes, 2-3 times a day. 4. Keep all follow-up visits as told by your health care provider. This is important.   Contact a health care provider  if:  Your pain is not controlled with medicine.  You have a fever.  You have more redness, swelling, or pain around the puncture site.  You have fluid or blood coming from the puncture site.  Your puncture site feels warm to the touch.  You have pus or a bad smell coming from the puncture site. Summary  After the procedure, it is common to have mild pain, tenderness, swelling, and bruising.  Follow instructions from your health care provider about how to take care of the puncture site and what activities are safe for you.  Take over-the-counter and prescription medicines only as told by your health care provider.  Contact a health care provider if you have any signs of infection, such as fluid or blood coming from the puncture site. This information is not intended to replace advice given to you by your health care provider. Make sure you discuss any questions you have with your health care provider. Document Revised: 04/16/2019 Document Reviewed: 04/16/2019 Elsevier Patient Education  2021 Elsevier Inc.    Moderate Conscious Sedation, Adult, Care After This sheet gives you information about how to care for yourself after your procedure. Your health care provider may also give you more specific instructions. If you have problems or questions, contact your health care provider. What can I expect after the procedure? After the procedure, it is common to have:  Sleepiness for several hours.  Impaired judgment for several hours.  Difficulty with balance.  Vomiting   if you eat too soon. Follow these instructions at home: For the time period you were told by your health care provider: 3. Rest. 4. Do not participate in activities where you could fall or become injured. 5. Do not drive or use machinery. 6. Do not drink alcohol. 7. Do not take sleeping pills or medicines that cause drowsiness. 8. Do not make important decisions or sign legal documents. 9. Do not take care of  children on your own.      Eating and drinking 4. Follow the diet recommended by your health care provider. 5. Drink enough fluid to keep your urine pale yellow. 6. If you vomit: ? Drink water, juice, or soup when you can drink without vomiting. ? Make sure you have little or no nausea before eating solid foods.   General instructions 5. Take over-the-counter and prescription medicines only as told by your health care provider. 6. Have a responsible adult stay with you for the time you are told. It is important to have someone help care for you until you are awake and alert. 7. Do not smoke. 8. Keep all follow-up visits as told by your health care provider. This is important. Contact a health care provider if:  You are still sleepy or having trouble with balance after 24 hours.  You feel light-headed.  You keep feeling nauseous or you keep vomiting.  You develop a rash.  You have a fever.  You have redness or swelling around the IV site. Get help right away if:  You have trouble breathing.  You have new-onset confusion at home. Summary  After the procedure, it is common to feel sleepy, have impaired judgment, or feel nauseous if you eat too soon.  Rest after you get home. Know the things you should not do after the procedure.  Follow the diet recommended by your health care provider and drink enough fluid to keep your urine pale yellow.  Get help right away if you have trouble breathing or new-onset confusion at home. This information is not intended to replace advice given to you by your health care provider. Make sure you discuss any questions you have with your health care provider. Document Revised: 03/27/2020 Document Reviewed: 10/24/2019 Elsevier Patient Education  2021 Elsevier Inc. 

## 2021-03-15 NOTE — Procedures (Signed)
Interventional Radiology Procedure Note  Procedure: RT ILIAC BM ASP AND CORE    Complications: None  Estimated Blood Loss:  MIN  Findings: 11 G CORE AND ASP    M. Daryll Brod, MD

## 2021-03-15 NOTE — H&P (Signed)
Chief Complaint: Patient was seen in consultation today for bone marrow biopsy at the request of Heilingoetter,Cassandra L  Referring Physician(s): Heilingoetter,Cassandra L  Supervising Physician: Daryll Brod  Patient Status: [facility] - Out-pt  History of Present Illness: [name] is a 75 y.o. male being worked up for abnormal SPEP concerning for possible myeloma. He is referred for bone marrow biopsy PMHx, meds, labs, imaging, allergies reviewed. Feels well, no recent fevers, chills, illness. Has been NPO today as directed. Family at bedside.   Past Medical History:  Diagnosis Date  . Arthritis   . Asthma    "hx of"  . Barrett's esophagus    EGD 03/23/2011 & EGD 2/09 bx proven  . BPH (benign prostatic hyperplasia)   . Cancer of parotid gland ([place]) 11/23/12   Adenocarcinoma  . Chronic abdominal pain   . Chronic constipation   . Colon polyp 03/23/2011   tubular adenoma, Dr. Gala Romney  . 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 ([place]) 07/15/2011  . CVA (cerebral infarction) 1998   right sided deficit  . Delayed gastric emptying 2018  . Diverticulosis    TCS 03/23/11 pancolonic diverticula &TCS 5/08, pancolonic diverticula  . DM (diabetes mellitus) ([place])   . Edema of lower extremity 12/21/12   bilateral   . Esotropia of left eye   . GERD (gastroesophageal reflux disease)   . Glaucoma (increased eye pressure)   . Gout   . Gout   . Hemorrhagic colitis 06/06/2012.  [name] Hemorrhoids, internal 03/23/2011   tcs by Dr. Gala Romney  . Hepatitis    esosiniphilic, tx with prednisone  . Hiatal hernia   . History of radiation therapy 05/21/18- 05/30/18   Left Lung/ 54 Gy delivered in 3 fractions of 18 Gy. SBRT  . HTN (hypertension)   . Hx of radiation therapy 1974   right base of skull area-lymphoma  . Hyperlipidemia   . Lower facial weakness    Right  . Lymphoma ([address]) 1974   XRT at [facility], right base of skull area  . Neuropathy   . Non-small cell lung cancer ([place]) dx'd  04/26/18  . Peripheral edema    R>L legs  . Rash    chronic, recurrent, R>L legs  . Renal insufficiency   . Steatohepatitis    liver biopsy 2009  . Stroke [facility]) 1998   right hemiparesis/plegia    Past Surgical History:  Procedure Laterality Date  . BIOPSY  12/01/2016   Procedure: BIOPSY;  Surgeon: Daneil Dolin, MD;  Location: [facility] ENDO SUITE;  Service: Endoscopy;;  duodenum, gastric, esophagus  . CHOLECYSTECTOMY    . COLONOSCOPY  03/23/11   Dr. Gala Romney  pancolonic diverticula, hemorrhoids, tubular adenoma.. next tcs 03/2016  . COLONOSCOPY WITH PROPOFOL N/A 12/01/2016   inadequate bowel prep precluded exam  . ESOPHAGOGASTRODUODENOSCOPY  02/05/08   goblet cell metaplasia/negative for H.pylori  . ESOPHAGOGASTRODUODENOSCOPY  03/23/11   Dr. Gala Romney, barretts, hiatal hernia  . ESOPHAGOGASTRODUODENOSCOPY (EGD) WITH PROPOFOL N/A 12/01/2016   Dr. Gala Romney: Large amount of retained gastric contents precluded completion of the stomach. Mucosal changes were found in the stomach. Erosions and somewhat scalloped appearing mucosa present, reactive gastritis/no H pylori. Barrett's esophagus noted, no dysplasia on biopsy. Duodenal biopsies taken as well, benign, no evidence of eosinophilia.  . IR FLUORO GUIDED NEEDLE PLC ASPIRATION/INJECTION LOC  12/13/2018  . IR KYPHO LUMBAR INC FX REDUCE BONE BX UNI/BIL CANNULATION INC/IMAGING  12/13/2018  . IR RADIOLOGY PERIPHERAL GUIDED IV START  12/13/2018  .  IR US GUIDE VASC ACCESS LEFT  12/13/2018  . MASS BIOPSY  11/01/2012   Procedure: NECK MASS BIOPSY;  Surgeon: Ascencion Dike, MD;  Location: [facility] ORS;  Service: ENT;  Laterality: Right;  Excisional Bx Right Neck Mass; attempted external jugular cutdown of left side  . PAROTIDECTOMY  11/24/2012   Procedure: PAROTIDECTOMY;  Surgeon: Ascencion Dike, MD;  Location: [place];  Service: ENT;  Laterality: N/A;  Total parotidectomy  . PLEURECTOMY    . right lymph node removal Right    behind right ear  . Right video-assisted thoracic surgery,  pleurectomy, and pleurodesis  2011  . VIDEO BRONCHOSCOPY WITH ENDOBRONCHIAL NAVIGATION N/A 04/26/2018   Procedure: VIDEO BRONCHOSCOPY WITH ENDOBRONCHIAL NAVIGATION;  Surgeon: Melrose Nakayama, MD;  Location: [facility];  Service: Thoracic;  Laterality: N/A;    Allergies: Patient has no known allergies.  Medications: Prior to Admission medications   Medication Sig Start Date End Date Taking? Authorizing Provider  allopurinol (ZYLOPRIM) 300 MG tablet TAKE 1 TABLET(300 MG) BY MOUTH DAILY 01/04/21   [name], MD  amLODipine (NORVASC) 10 MG tablet TAKE 1 TABLET(10 MG) BY MOUTH DAILY 10/02/20   [place], [name], MD  cloNIDine (CATAPRES) 0.2 MG tablet TAKE 1 TABLET(0.2 MG) BY MOUTH THREE TIMES DAILY Patient taking differently: Take 0.2 mg by mouth daily. 01/05/21   [name], MD  diclofenac sodium (VOLTAREN) 1 % GEL Apply twice a day for affected area on ankle as needed Patient taking differently: Apply 4 g topically 2 (two) times daily as needed. Apply twice a day for affected area on ankle as needed 09/18/19   [name], MD  fluticasone [facility]) 50 MCG/ACT nasal spray Place 2 sprays into both nostrils daily. 09/25/20   [place], [name], MD  furosemide (LASIX) 40 MG tablet Take 1 tablet (40 mg total) by mouth daily. May give additional dose QD PRN for increased edema. 11/24/20   [place], [name], MD  gabapentin (NEURONTIN) 300 MG capsule TAKE 2 CAPSULES BY MOUTH TWICE DAILY, MAY TAKE AN ADDTIONAL 2 CAPSULES MIDDAY AS NEEDED FOR PAIN 11/02/20   [name], MD  glucose blood [facility] VERIO) test strip Use as directed to check Blood Glucose twice daily 11/25/20   Brita Romp, NP  insulin glargine (LANTUS SOLOSTAR) 100 UNIT/ML Solostar Pen Inject 30 Units into the skin at bedtime. 03/04/21   Brita Romp, NP  iron polysaccharides (NIFEREX) 150 MG capsule Take 1 capsule (150 mg total) by mouth daily. 06/16/20   [place], [name], MD  Lancets [facility] DELICA  PLUS [id number]) MISC USE TO TEST BLOOD SUGAR FOUR TIMES DAILY Patient taking differently: 1 Device by Other route in the morning, at noon, in the evening, and at bedtime. 02/27/20   Cassandria Anger, MD  lisinopril (ZESTRIL) 20 MG tablet TAKE 1 TABLET(20 MG) BY MOUTH DAILY 09/17/20   [place], [name], MD  lubiprostone (AMITIZA) 24 MCG capsule TAKE 1 CAPSULE(24 MCG) BY MOUTH TWICE DAILY WITH A MEAL 12/14/20   , [name], MD  metoprolol succinate (TOPROL-XL) 50 MG 24 hr tablet TAKE 1 TABLET BY MOUTH EVERY DAY WITH OR IMMEDIATELY FOLLOWING A MEAL 02/15/21   , [name], MD  nystatin (MYCOSTATIN/NYSTOP) powder Apply topically 3 (three) times daily. Patient taking differently: Apply topically as needed. 01/26/21   [name], MD  oxyCODONE-acetaminophen (PERCOCET) 7.5-325 MG tablet Take 1 tablet by mouth every 4 (four) hours as needed. 01/26/21   Vic Blackbird  F, MD  pantoprazole (PROTONIX) 40 MG tablet TAKE 1 TABLET(40 MG) BY MOUTH DAILY 10/05/20   Vic Blackbird F, MD  simvastatin (ZOCOR) 20 MG tablet TAKE 1 TABLET(20 MG) BY MOUTH AT BEDTIME 08/24/20   [place], [name], MD  tamsulosin (FLOMAX) 0.4 MG CAPS capsule TAKE 1 CAPSULE(0.4 MG) BY MOUTH DAILY 10/02/20   [place], [name], MD  tiZANidine (ZANAFLEX) 2 MG tablet TAKE 1 TABLET(2 MG) BY MOUTH TWICE DAILY AS NEEDED 02/15/21   [name], MD  Triamcinolone Acetonide (TRIAMCINOLONE 0.1 % CREAM : EUCERIN) CREA Apply 1 application topically 2 (two) times daily as needed. Patient taking differently: Apply 1 application topically 2 (two) times daily as needed for rash. 02/06/18   [name], MD     Family History  Problem Relation Age of Onset  . Heart failure Mother   . Heart failure Father   . Heart failure Sister   . Heart failure Son   . Colon cancer Neg Hx     Social History   Socioeconomic History  . Marital status: Married    Spouse name: Not on file  . Number of children: 1  . Years of education: Not on  file  . Highest education level: Not on file  Occupational History  . Occupation: disabled  Tobacco Use  . Smoking status: Former Smoker    Packs/day: 3.00    Years: 25.00    Pack years: 75.00    Types: Cigarettes    Quit date: 03/01/1997    Years since quitting: 24.0  . Smokeless tobacco: Never Used  Vaping Use  . Vaping Use: Never used  Substance and Sexual Activity  . Alcohol use: No  . Drug use: No  . Sexual activity: Not Currently  Other Topics Concern  . Not on file  Social History Narrative  . Not on file   Social Determinants of Health   Financial Resource Strain: Low Risk   . Difficulty of Paying Living Expenses: Not very hard  Food Insecurity: Not on file  Transportation Needs: Not on file  Physical Activity: Not on file  Stress: Not on file  Social Connections: Not on file     Review of Systems: A 12 point ROS discussed and pertinent positives are indicated in the HPI above.  All other systems are negative.  Review of Systems  Vital Signs: BP 137/86   Pulse 76   Temp 98.2 F (36.8 C)   Resp 20   Ht 6' 1"  (1.854 m)   Wt 102.1 kg   SpO2 100%   BMI 29.69 kg/m   Physical Exam Constitutional:      Appearance: Normal appearance. He is not ill-appearing.  HENT:     Mouth/Throat:     Mouth: Mucous membranes are moist.     Pharynx: Oropharynx is clear.  Cardiovascular:     Rate and Rhythm: Normal rate and regular rhythm.     Heart sounds: Normal heart sounds.  Pulmonary:     Effort: Pulmonary effort is normal. No respiratory distress.     Breath sounds: Normal breath sounds.  Neurological:     Mental Status: He is alert.  Psychiatric:        Thought Content: Thought content normal.        Judgment: Judgment normal.      Imaging: No results found.  Labs:  CBC: Recent Labs    04/10/20 1030 06/10/20 1622 09/02/20 1019 03/03/21 1301  WBC 10.3 9.0 8.5 9.0  HGB 9.2* 10.9* 12.0* 11.4*  HCT 31.1* 37.3* 40.5 37.0*  PLT 414* 353 362 278     COAGS: No results for input(s): INR, APTT in the last 8760 hours.  BMP: Recent Labs    05/12/20 0948 06/10/20 1622 09/02/20 1019 12/16/20 0918 03/03/21 1301 03/15/21 0930  NA 140   < > 143 143 141 139  K 4.6   < > 4.3 4.3 4.5 3.8  CL 104   < > 106 105 106 104  CO2 27   < > 27 20 25 25   GLUCOSE 112*   < > 96 105* 158* 99  BUN 28*   < > 15 28* 25* 25*  CALCIUM 11.2*   < > 10.4* 10.7* 10.2 9.8  CREATININE 1.86*   < > 1.58* 1.99* 2.03* 1.75*  GFRNONAA 35*  --   --  32* 34* 40*  GFRAA 41*  --   --  37*  --   --    < > = values in this interval not displayed.    LIVER FUNCTION TESTS: Recent Labs    05/12/20 0948 09/02/20 1019 12/16/20 0918 03/03/21 1301  BILITOT 0.2 0.4 0.2 <0.2*  AST 11 10 13  9*  ALT 8* 6* 10 10  ALKPHOS  --   --  152* 115  PROT 7.7 6.7 6.8 7.7  ALBUMIN  --   --  3.7 3.2*    TUMOR MARKERS: No results for input(s): AFPTM, CEA, CA199, CHROMGRNA in the last 8760 hours.  Assessment and Plan: Abnormal SPEP Plan for CT guided bone marrow biopsy Labs reviewed. Risks and benefits of bone marrow bx was discussed with the patient and/or patient's family including, but not limited to bleeding, infection, damage to adjacent structures or low yield requiring additional tests.  All of the questions were answered and there is agreement to proceed.  Consent signed and in chart.    Thank you for this interesting consult.  I greatly enjoyed meeting [name] and look forward to participating in their care.  A copy of this report was sent to the requesting provider on this date.  Electronically Signed: Ascencion Dike, PA-C 03/15/2021, 10:03 AM   I spent a total of 20 minutes in face to face in clinical consultation, greater than 50% of which was counseling/coordinating care for bone marrow bx

## 2021-03-17 ENCOUNTER — Other Ambulatory Visit: Payer: Self-pay

## 2021-03-17 ENCOUNTER — Ambulatory Visit (INDEPENDENT_AMBULATORY_CARE_PROVIDER_SITE_OTHER): Payer: Medicare Other | Admitting: Nurse Practitioner

## 2021-03-17 ENCOUNTER — Encounter: Payer: Self-pay | Admitting: Nurse Practitioner

## 2021-03-17 VITALS — BP 132/68 | HR 70 | Temp 98.7°F | Resp 16

## 2021-03-17 DIAGNOSIS — L98492 Non-pressure chronic ulcer of skin of other sites with fat layer exposed: Secondary | ICD-10-CM | POA: Diagnosis not present

## 2021-03-17 LAB — SURGICAL PATHOLOGY

## 2021-03-17 MED ORDER — MUPIROCIN CALCIUM 2 % EX CREA
1.0000 | TOPICAL_CREAM | Freq: Two times a day (BID) | CUTANEOUS | 0 refills | Status: DC
Start: 2021-03-17 — End: 2021-09-07

## 2021-03-17 NOTE — Progress Notes (Signed)
Subjective:    Patient ID: [name], male    DOB: [birth year], 75 y.o.   MRN: [id number]  HPI: [name] is a 75 y.o. male presenting with wife for skin infection.  Chief Complaint  Patient presents with  . Abscess    X1 week- open area to upper L shoulder- draining   SKIN INFECTION Has been using Neosporin  Duration: weeks - 2 Location: right shoulder  History of trauma in area: no; fell in November and had fractures  Pain: yes Quality: sharp Severity: moderate Redness: no Swelling: no Oozing: yes Pus: no Fevers: no Nausea/vomiting: no Status: worse Treatments attempted: Neosporin  No Known Allergies  Outpatient Encounter Medications as of 03/17/2021  Medication Sig  . allopurinol (ZYLOPRIM) 300 MG tablet TAKE 1 TABLET(300 MG) BY MOUTH DAILY  . amLODipine (NORVASC) 10 MG tablet TAKE 1 TABLET(10 MG) BY MOUTH DAILY  . cloNIDine (CATAPRES) 0.2 MG tablet TAKE 1 TABLET(0.2 MG) BY MOUTH THREE TIMES DAILY (Patient taking differently: Take 0.2 mg by mouth daily.)  . diclofenac sodium (VOLTAREN) 1 % GEL Apply twice a day for affected area on ankle as needed (Patient taking differently: Apply 4 g topically 2 (two) times daily as needed. Apply twice a day for affected area on ankle as needed)  . fluticasone (FLONASE) 50 MCG/ACT nasal spray Place 2 sprays into both nostrils daily.  . furosemide (LASIX) 40 MG tablet Take 1 tablet (40 mg total) by mouth daily. May give additional dose QD PRN for increased edema.  . gabapentin (NEURONTIN) 300 MG capsule TAKE 2 CAPSULES BY MOUTH TWICE DAILY, MAY TAKE AN ADDTIONAL 2 CAPSULES MIDDAY AS NEEDED FOR PAIN  . glucose blood (ONETOUCH VERIO) test strip Use as directed to check Blood Glucose twice daily  . insulin glargine (LANTUS SOLOSTAR) 100 UNIT/ML Solostar Pen Inject 30 Units into the skin at bedtime.  . iron polysaccharides (NIFEREX) 150 MG capsule Take 1 capsule (150 mg total) by mouth daily.  . Lancets (ONETOUCH DELICA PLUS  [id number]) MISC USE TO TEST BLOOD SUGAR FOUR TIMES DAILY (Patient taking differently: 1 Device by Other route in the morning, at noon, in the evening, and at bedtime.)  . lisinopril (ZESTRIL) 20 MG tablet TAKE 1 TABLET(20 MG) BY MOUTH DAILY  . lubiprostone (AMITIZA) 24 MCG capsule TAKE 1 CAPSULE(24 MCG) BY MOUTH TWICE DAILY WITH A MEAL  . metoprolol succinate (TOPROL-XL) 50 MG 24 hr tablet TAKE 1 TABLET BY MOUTH EVERY DAY WITH OR IMMEDIATELY FOLLOWING A MEAL  . mupirocin cream (BACTROBAN) 2 % Apply 1 application topically 2 (two) times daily. Limit use for 2 weeks. Return to clinic with no improvement.  . nystatin (MYCOSTATIN/NYSTOP) powder Apply topically 3 (three) times daily. (Patient taking differently: Apply topically as needed.)  . oxyCODONE-acetaminophen (PERCOCET) 7.5-325 MG tablet Take 1 tablet by mouth every 4 (four) hours as needed.  . pantoprazole (PROTONIX) 40 MG tablet TAKE 1 TABLET(40 MG) BY MOUTH DAILY  . simvastatin (ZOCOR) 20 MG tablet TAKE 1 TABLET(20 MG) BY MOUTH AT BEDTIME  . tamsulosin (FLOMAX) 0.4 MG CAPS capsule TAKE 1 CAPSULE(0.4 MG) BY MOUTH DAILY  . tiZANidine (ZANAFLEX) 2 MG tablet TAKE 1 TABLET(2 MG) BY MOUTH TWICE DAILY AS NEEDED  . Triamcinolone Acetonide (TRIAMCINOLONE 0.1 % CREAM : EUCERIN) CREA Apply 1 application topically 2 (two) times daily as needed. (Patient taking differently: Apply 1 application topically 2 (two) times daily as needed for rash.)   No facility-administered encounter medications on file as  of 03/17/2021.    Patient Active Problem List   Diagnosis Date Noted  . MGUS (monoclonal gammopathy of unknown significance) 03/03/2021  . Chronic obstructive pulmonary disease ([place]) 02/17/2021  . Common variable immunodeficiency ([place]) 02/17/2021  . Osteoarthrosis 02/17/2021  . Diverticular disease 01/05/2021  . History of cerebrovascular accident 01/05/2021  . Hiatal hernia 01/05/2021  . Hyperlipidemia 01/05/2021  . Glaucoma 01/05/2021  . Esotropia  of left eye 01/05/2021  . Benign prostatic hyperplasia 01/05/2021  . History of radiation therapy 01/05/2021  . Anemia 01/05/2021  . GI bleed 03/18/2020  . Acute lower GI bleeding 03/03/2020  . Pressure injury of skin 03/03/2020  . Thoracic aortic aneurysm without rupture ([place]) 11/19/2019  . Hardening of the aorta (main artery of the heart) ([place]) 11/19/2019  . Protein-calorie malnutrition ([place]) 06/10/2019  . Avitaminosis D 03/07/2019  . Male hypogonadism 07/26/2018  . Gynecomastia 06/27/2018  . Malignant neoplasm of upper lobe of left lung ([place]) 04/06/2018  . History of malignant neoplasm of parotid gland 02/07/2018  . Adenoma of large intestine 11/09/2016  . Adult hypothyroidism 09/09/2015  . Chronic constipation 05/22/2015  . Impetigo bullosa 10/23/2013  . Chronic left shoulder pain 09/25/2013  . Muscle weakness (generalized) 09/25/2013  . Hypercalcemia 08/08/2013  . Chronic kidney disease, stage 3 unspecified ([place]) 08/07/2013  . AKI (acute kidney injury) ([place]) 07/24/2013  . Acute kidney failure, unspecified ([place]) 07/24/2013  . Adhesive capsulitis of left shoulder 05/14/2013  . GERD (gastroesophageal reflux disease)   . Carcinoma of parotid gland ([place]) 11/23/2012  . Epidermoid cyst 10/05/2012  . Neck pain 08/26/2012  . Chronic abdominal pain 08/26/2012  . Edema of lower extremity 08/24/2012  . Hemiplegia of dominant side as late effect of cerebrovascular disease ([place]) 06/06/2012  . Diarrhea 04/17/2012  . Alimentary obesity 04/17/2012  . Type 2 diabetes mellitus with diabetic neuropathy, unspecified ([place]) 02/13/2012  . Recurrent boils 01/12/2012  . 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 ([place]) 07/15/2011  . Hemorrhoids, internal 03/23/2011  . Hepatitis 03/02/2011  . Steatohepatitis 03/02/2011  . Abdominal pain, right upper quadrant 12/16/2010  . Abnormal levels of other serum enzymes 12/16/2010  . Eosinophil count raised 12/23/2009  . HEMATOCHEZIA 12/23/2009  .  Type 2 diabetes mellitus ([place]) 01/05/2009  . Gout 01/05/2009  . Hypertensive disorder 01/05/2009  . BARRETTS ESOPHAGUS 01/05/2009  . DIVERTICULOSIS OF COLON 01/05/2009  . History of malignant lymphoma 01/05/2009    Past Medical History:  Diagnosis Date  . Arthritis   . Asthma    "hx of"  . Barrett's esophagus    EGD 03/23/2011 & EGD 2/09 bx proven  . BPH (benign prostatic hyperplasia)   . Cancer of parotid gland ([place]) 11/23/12   Adenocarcinoma  . Chronic abdominal pain   . Chronic constipation   . Colon polyp 03/23/2011   tubular adenoma, Dr. Gala Romney  . 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 ([place]) 07/15/2011  . CVA (cerebral infarction) 1998   right sided deficit  . Delayed gastric emptying 2018  . Diverticulosis    TCS 03/23/11 pancolonic diverticula &TCS 5/08, pancolonic diverticula  . DM (diabetes mellitus) ([place])   . Edema of lower extremity 12/21/12   bilateral   . Esotropia of left eye   . GERD (gastroesophageal reflux disease)   . Glaucoma (increased eye pressure)   . Gout   . Gout   . Hemorrhagic colitis 06/06/2012.  [name] Hemorrhoids, internal 03/23/2011   tcs by Dr. Gala Romney  . Hepatitis  esosiniphilic, tx with prednisone  . Hiatal hernia   . History of radiation therapy 05/21/18- 05/30/18   Left Lung/ 54 Gy delivered in 3 fractions of 18 Gy. SBRT  . HTN (hypertension)   . Hx of radiation therapy 1974   right base of skull area-lymphoma  . Hyperlipidemia   . Lower facial weakness    Right  . Lymphoma ([place]) 1974   XRT at [facility], right base of skull area  . Neuropathy   . Non-small cell lung cancer ([place]) dx'd 04/26/18  . Peripheral edema    R>L legs  . Rash    chronic, recurrent, R>L legs  . Renal insufficiency   . Steatohepatitis    liver biopsy 2009  . Stroke [facility]) 1998   right hemiparesis/plegia    Relevant past medical, surgical, family and social history reviewed and updated as indicated. Interim medical history since our last visit  reviewed.  Review of Systems Per HPI unless specifically indicated above     Objective:    BP 132/68   Pulse 70   Temp 98.7 F (37.1 C) (Temporal)   Resp 16   SpO2 96%   Wt Readings from Last 3 Encounters:  03/15/21 225 lb (102.1 kg)  03/05/21 225 lb (102.1 kg)  08/14/20 250 lb (113.4 kg)    Physical Exam Vitals and nursing note reviewed.  Constitutional:      General: He is not in acute distress.    Appearance: Normal appearance. He is not toxic-appearing.  Skin:    General: Skin is warm and dry.     Capillary Refill: Capillary refill takes less than 2 seconds.     Coloration: Skin is not jaundiced or pale.     Findings: Lesion present. No erythema.       Neurological:     Mental Status: He is alert. Mental status is at baseline.     Motor: Weakness (right sided weakness baseline after stroke) present.     Gait: Gait abnormal (wheelchair bound).  Psychiatric:        Behavior: Behavior normal.       Assessment & Plan:  1. Skin ulcer of shoulder with fat layer exposed ([place]) Acute.  No red flags - no s/s systemic infection today and no abscess on examination.  Likely secondary to pressure and not changing positions frequently given history of stroke and immobility in that arm.  Discussed localized wound care with patient and spouse.  Encouraged cleaning twice daily with antiseptic rinse, then covering with nonadherent bandage and can apply thing layer of Bactroban.  Follow up in 2-4 weeks if wound is not improving.    - mupirocin cream (BACTROBAN) 2 %; Apply 1 application topically 2 (two) times daily. Limit use for 2 weeks. Return to clinic with no improvement.  Dispense: 15 g; Refill: 0    Follow up plan: Return in about 2 weeks (around 03/31/2021), or if symptoms worsen or fail to improve.

## 2021-03-18 ENCOUNTER — Encounter: Payer: Self-pay | Admitting: *Deleted

## 2021-03-25 ENCOUNTER — Encounter (HOSPITAL_COMMUNITY): Payer: Self-pay | Admitting: Internal Medicine

## 2021-03-29 LAB — SURGICAL PATHOLOGY

## 2021-03-30 ENCOUNTER — Inpatient Hospital Stay: Payer: Medicare Other | Attending: Physician Assistant | Admitting: Internal Medicine

## 2021-03-30 ENCOUNTER — Other Ambulatory Visit: Payer: Self-pay

## 2021-03-30 VITALS — BP 142/88 | HR 69 | Temp 97.9°F | Resp 18 | Ht 73.0 in

## 2021-03-30 DIAGNOSIS — Z85858 Personal history of malignant neoplasm of other endocrine glands: Secondary | ICD-10-CM | POA: Insufficient documentation

## 2021-03-30 DIAGNOSIS — Z85118 Personal history of other malignant neoplasm of bronchus and lung: Secondary | ICD-10-CM | POA: Diagnosis not present

## 2021-03-30 DIAGNOSIS — D472 Monoclonal gammopathy: Secondary | ICD-10-CM

## 2021-03-30 DIAGNOSIS — C3412 Malignant neoplasm of upper lobe, left bronchus or lung: Secondary | ICD-10-CM | POA: Diagnosis not present

## 2021-03-30 NOTE — Progress Notes (Signed)
[place] [phone]   [phone]  OFFICE PROGRESS NOTE  [name], MD [address] [place] [address]  DIAGNOSIS:  1) history of non-small cell lung cancer 2) MGUS  PRIOR THERAPY: None  CURRENT THERAPY: Observation  INTERVAL HISTORY: [name] 75 y.o. male returns to the clinic today for follow-up visit accompanied by his wife.  The patient is feeling fine today with no concerning complaints except for intermittent pain in in the right upper quadrant of the abdomen.  He denied having any associated nausea, vomiting, diarrhea or constipation.  He denied having any fever or chills.  He has no chest pain, shortness of breath, cough or hemoptysis.  He had a bone marrow biopsy and aspirate performed recently and the patient is here today for evaluation and discussion of his biopsy results and recommendation regarding his condition.  MEDICAL HISTORY: Past Medical History:  Diagnosis Date  . Arthritis   . Asthma    "hx of"  . Barrett's esophagus    EGD 03/23/2011 & EGD 2/09 bx proven  . BPH (benign prostatic hyperplasia)   . Cancer of parotid gland ([place]) 11/23/12   Adenocarcinoma  . Chronic abdominal pain   . Chronic constipation   . Colon polyp 03/23/2011   tubular adenoma, Dr. Gala Romney  . 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 ([place]) 07/15/2011  . CVA (cerebral infarction) 1998   right sided deficit  . Delayed gastric emptying 2018  . Diverticulosis    TCS 03/23/11 pancolonic diverticula &TCS 5/08, pancolonic diverticula  . DM (diabetes mellitus) ([place])   . Edema of lower extremity 12/21/12   bilateral   . Esotropia of left eye   . GERD (gastroesophageal reflux disease)   . Glaucoma (increased eye pressure)   . Gout   . Gout   . Hemorrhagic colitis 06/06/2012.  [name] Hemorrhoids, internal 03/23/2011   tcs by Dr. Gala Romney  . Hepatitis    esosiniphilic, tx with prednisone  . Hiatal hernia   . History of  radiation therapy 05/21/18- 05/30/18   Left Lung/ 54 Gy delivered in 3 fractions of 18 Gy. SBRT  . HTN (hypertension)   . Hx of radiation therapy 1974   right base of skull area-lymphoma  . Hyperlipidemia   . Lower facial weakness    Right  . Lymphoma ([place]) 1974   XRT at [facility], right base of skull area  . Neuropathy   . Non-small cell lung cancer ([place]) dx'd 04/26/18  . Peripheral edema    R>L legs  . Rash    chronic, recurrent, R>L legs  . Renal insufficiency   . Steatohepatitis    liver biopsy 2009  . Stroke [facility]) 1998   right hemiparesis/plegia    ALLERGIES:  has No Known Allergies.  MEDICATIONS:  Current Outpatient Medications  Medication Sig Dispense Refill  . allopurinol (ZYLOPRIM) 300 MG tablet TAKE 1 TABLET(300 MG) BY MOUTH DAILY 90 tablet 1  . amLODipine (NORVASC) 10 MG tablet TAKE 1 TABLET(10 MG) BY MOUTH DAILY 90 tablet 1  . cloNIDine (CATAPRES) 0.2 MG tablet TAKE 1 TABLET(0.2 MG) BY MOUTH THREE TIMES DAILY (Patient taking differently: Take 0.2 mg by mouth daily.) 270 tablet 2  . diclofenac sodium (VOLTAREN) 1 % GEL Apply twice a day for affected area on ankle as needed (Patient taking differently: Apply 4 g topically 2 (two) times daily as needed. Apply twice a day for affected  area on ankle as needed) 100 g 1  . fluticasone (FLONASE) 50 MCG/ACT nasal spray Place 2 sprays into both nostrils daily. 16 g 2  . furosemide (LASIX) 40 MG tablet Take 1 tablet (40 mg total) by mouth daily. May give additional dose QD PRN for increased edema. 90 tablet 2  . gabapentin (NEURONTIN) 300 MG capsule TAKE 2 CAPSULES BY MOUTH TWICE DAILY, MAY TAKE AN ADDTIONAL 2 CAPSULES MIDDAY AS NEEDED FOR PAIN 540 capsule 3  . glucose blood (ONETOUCH VERIO) test strip Use as directed to check Blood Glucose twice daily 150 strip 5  . insulin glargine (LANTUS SOLOSTAR) 100 UNIT/ML Solostar Pen Inject 30 Units into the skin at bedtime. 15 mL 2  . iron polysaccharides (NIFEREX) 150 MG capsule Take 1  capsule (150 mg total) by mouth daily. 90 capsule 3  . Lancets (ONETOUCH DELICA PLUS [id number]) MISC USE TO TEST BLOOD SUGAR FOUR TIMES DAILY (Patient taking differently: 1 Device by Other route in the morning, at noon, in the evening, and at bedtime.) 200 each 2  . lisinopril (ZESTRIL) 20 MG tablet TAKE 1 TABLET(20 MG) BY MOUTH DAILY 90 tablet 2  . lubiprostone (AMITIZA) 24 MCG capsule TAKE 1 CAPSULE(24 MCG) BY MOUTH TWICE DAILY WITH A MEAL 90 capsule 1  . metoprolol succinate (TOPROL-XL) 50 MG 24 hr tablet TAKE 1 TABLET BY MOUTH EVERY DAY WITH OR IMMEDIATELY FOLLOWING A MEAL 90 tablet 0  . mupirocin cream (BACTROBAN) 2 % Apply 1 application topically 2 (two) times daily. Limit use for 2 weeks. Return to clinic with no improvement. 15 g 0  . nystatin (MYCOSTATIN/NYSTOP) powder Apply topically 3 (three) times daily. (Patient taking differently: Apply topically as needed.) 60 g 2  . oxyCODONE-acetaminophen (PERCOCET) 7.5-325 MG tablet Take 1 tablet by mouth every 4 (four) hours as needed. 180 tablet 0  . pantoprazole (PROTONIX) 40 MG tablet TAKE 1 TABLET(40 MG) BY MOUTH DAILY 90 tablet 2  . simvastatin (ZOCOR) 20 MG tablet TAKE 1 TABLET(20 MG) BY MOUTH AT BEDTIME 90 tablet 2  . tamsulosin (FLOMAX) 0.4 MG CAPS capsule TAKE 1 CAPSULE(0.4 MG) BY MOUTH DAILY 90 capsule 2  . tiZANidine (ZANAFLEX) 2 MG tablet TAKE 1 TABLET(2 MG) BY MOUTH TWICE DAILY AS NEEDED 180 tablet 2  . Triamcinolone Acetonide (TRIAMCINOLONE 0.1 % CREAM : EUCERIN) CREA Apply 1 application topically 2 (two) times daily as needed. (Patient taking differently: Apply 1 application topically 2 (two) times daily as needed for rash.) 1 each 2   No current facility-administered medications for this visit.    SURGICAL HISTORY:  Past Surgical History:  Procedure Laterality Date  . BIOPSY  12/01/2016   Procedure: BIOPSY;  Surgeon: Daneil Dolin, MD;  Location: [facility];  Service: Endoscopy;;  duodenum, gastric, esophagus  .  CHOLECYSTECTOMY    . COLONOSCOPY  03/23/11   Dr. Gala Romney  pancolonic diverticula, hemorrhoids, tubular adenoma.. next tcs 03/2016  . COLONOSCOPY WITH PROPOFOL N/A 12/01/2016   inadequate bowel prep precluded exam  . ESOPHAGOGASTRODUODENOSCOPY  02/05/08   goblet cell metaplasia/negative for H.pylori  . ESOPHAGOGASTRODUODENOSCOPY  03/23/11   Dr. Gala Romney, barretts, hiatal hernia  . ESOPHAGOGASTRODUODENOSCOPY (EGD) WITH PROPOFOL N/A 12/01/2016   Dr. Gala Romney: Large amount of retained gastric contents precluded completion of the stomach. Mucosal changes were found in the stomach. Erosions and somewhat scalloped appearing mucosa present, reactive gastritis/no H pylori. Barrett's esophagus noted, no dysplasia on biopsy. Duodenal biopsies taken as well, benign, no evidence of eosinophilia.  [name]  IR FLUORO GUIDED NEEDLE PLC ASPIRATION/INJECTION LOC  12/13/2018  . IR KYPHO LUMBAR INC FX REDUCE BONE BX UNI/BIL CANNULATION INC/IMAGING  12/13/2018  . IR RADIOLOGY PERIPHERAL GUIDED IV START  12/13/2018  . IR US GUIDE VASC ACCESS LEFT  12/13/2018  . MASS BIOPSY  11/01/2012   Procedure: NECK MASS BIOPSY;  Surgeon: Ascencion Dike, MD;  Location: [facility];  Service: ENT;  Laterality: Right;  Excisional Bx Right Neck Mass; attempted external jugular cutdown of left side  . PAROTIDECTOMY  11/24/2012   Procedure: PAROTIDECTOMY;  Surgeon: Ascencion Dike, MD;  Location: [place];  Service: ENT;  Laterality: N/A;  Total parotidectomy  . PLEURECTOMY    . right lymph node removal Right    behind right ear  . Right video-assisted thoracic surgery, pleurectomy, and pleurodesis  2011  . VIDEO BRONCHOSCOPY WITH ENDOBRONCHIAL NAVIGATION N/A 04/26/2018   Procedure: VIDEO BRONCHOSCOPY WITH ENDOBRONCHIAL NAVIGATION;  Surgeon: Melrose Nakayama, MD;  Location: [place];  Service: Thoracic;  Laterality: N/A;    REVIEW OF SYSTEMS:  A comprehensive review of systems was negative except for: Constitutional: positive for fatigue Gastrointestinal: positive for  abdominal pain   PHYSICAL EXAMINATION: General appearance: alert, cooperative, fatigued and no distress Head: Normocephalic, without obvious abnormality, atraumatic Neck: no adenopathy, no JVD, supple, symmetrical, trachea midline and thyroid not enlarged, symmetric, no tenderness/mass/nodules Lymph nodes: Cervical, supraclavicular, and axillary nodes normal. Resp: clear to auscultation bilaterally Back: symmetric, no curvature. ROM normal. No CVA tenderness. Cardio: regular rate and rhythm, S1, S2 normal, no murmur, click, rub or gallop GI: soft, non-tender; bowel sounds normal; no masses,  no organomegaly Extremities: extremities normal, atraumatic, no cyanosis or edema  ECOG PERFORMANCE STATUS: 1 - Symptomatic but completely ambulatory  Blood pressure (!) 142/88, pulse 69, temperature 97.9 F (36.6 C), temperature source Tympanic, resp. rate 18, height 6' 1"  (1.854 m), SpO2 100 %.  LABORATORY DATA: Lab Results  Component Value Date   WBC 10.7 (H) 2021-03-27   HGB 12.3 (L) 03/27/21   HCT 41.2 03/27/21   MCV 73.4 (L) 03/27/21   PLT 285 03-27-21      Chemistry      Component Value Date/Time   NA 139 03-27-21 0930   NA 143 12/16/2020 0918   K 3.8 03-27-21 0930   K 4.3 08/16/2011 0957   CL 104 03/27/2021 0930   CO2 25 March 27, 2021 0930   BUN 25 (H) 27-Mar-2021 0930   BUN 28 (H) 12/16/2020 0918   BUN 23 08/16/2011 0957   CREATININE 1.75 (H) 2021/03/27 0930   CREATININE 2.03 (H) 03/03/2021 1301   CREATININE 1.58 (H) 09/02/2020 1019      Component Value Date/Time   CALCIUM 9.8 03/27/2021 0930   CALCIUM 10.3 08/16/2011 0957   CALCIUM 11.4 (H) 08/12/2011 1349   ALKPHOS 115 03/03/2021 1301   ALKPHOS 80 08/16/2011 0957   AST 9 (L) 03/03/2021 1301   ALT 10 03/03/2021 1301   BILITOT <0.2 (L) 03/03/2021 1301       RADIOGRAPHIC STUDIES: CT Biopsy  Result Date: 27-Mar-2021 INDICATION: Concern for multiple myeloma, abnormal lab values EXAM: CT GUIDED RIGHT ILIAC  BONE MARROW ASPIRATION AND CORE BIOPSY Date:  03-27-2021 2021/03/27 11:47 am Radiologist:  Jerilynn Mages. Daryll Brod, MD Guidance:  CT FLUOROSCOPY TIME:  Fluoroscopy Time: None. MEDICATIONS: 1% lidocaine local ANESTHESIA/SEDATION: 2.0 mg IV Versed; 50 mcg IV Fentanyl Moderate Sedation Time:  17 minutes The patient was continuously monitored during the procedure by the interventional radiology nurse  under my direct supervision. CONTRAST:  None. COMPLICATIONS: None PROCEDURE: Informed consent was obtained from the patient following explanation of the procedure, risks, benefits and alternatives. The patient understands, agrees and consents for the procedure. All questions were addressed. A time out was performed. The patient was positioned prone and non-contrast localization CT was performed of the pelvis to demonstrate the iliac marrow spaces. Maximal barrier sterile technique utilized including caps, mask, sterile gowns, sterile gloves, large sterile drape, hand hygiene, and Betadine prep. Under sterile conditions and local anesthesia, an 11 gauge coaxial bone biopsy needle was advanced into the right iliac marrow space. Needle position was confirmed with CT imaging. Initially, bone marrow aspiration was performed. Next, the 11 gauge outer cannula was utilized to obtain a right iliac bone marrow core biopsy. Needle was removed. Hemostasis was obtained with compression. The patient tolerated the procedure well. Samples were prepared with the cytotechnologist. No immediate complications. IMPRESSION: CT guided right iliac bone marrow aspiration and core biopsy. Electronically Signed   By: Jerilynn Mages.  Shick M.D.   On: 03/15/2021 13:37   CT BONE MARROW BIOPSY & ASPIRATION  Result Date: 03/15/2021 INDICATION: Concern for multiple myeloma, abnormal lab values EXAM: CT GUIDED RIGHT ILIAC BONE MARROW ASPIRATION AND CORE BIOPSY Date:  03/15/2021 03/15/2021 11:47 am Radiologist:  Jerilynn Mages. Daryll Brod, MD Guidance:  CT FLUOROSCOPY TIME:  Fluoroscopy Time:  None. MEDICATIONS: 1% lidocaine local ANESTHESIA/SEDATION: 2.0 mg IV Versed; 50 mcg IV Fentanyl Moderate Sedation Time:  17 minutes The patient was continuously monitored during the procedure by the interventional radiology nurse under my direct supervision. CONTRAST:  None. COMPLICATIONS: None PROCEDURE: Informed consent was obtained from the patient following explanation of the procedure, risks, benefits and alternatives. The patient understands, agrees and consents for the procedure. All questions were addressed. A time out was performed. The patient was positioned prone and non-contrast localization CT was performed of the pelvis to demonstrate the iliac marrow spaces. Maximal barrier sterile technique utilized including caps, mask, sterile gowns, sterile gloves, large sterile drape, hand hygiene, and Betadine prep. Under sterile conditions and local anesthesia, an 11 gauge coaxial bone biopsy needle was advanced into the right iliac marrow space. Needle position was confirmed with CT imaging. Initially, bone marrow aspiration was performed. Next, the 11 gauge outer cannula was utilized to obtain a right iliac bone marrow core biopsy. Needle was removed. Hemostasis was obtained with compression. The patient tolerated the procedure well. Samples were prepared with the cytotechnologist. No immediate complications. IMPRESSION: CT guided right iliac bone marrow aspiration and core biopsy. Electronically Signed   By: Jerilynn Mages.  Shick M.D.   On: 03/15/2021 13:37    ASSESSMENT AND PLAN: This is a very pleasant 75 years old African-American male with history of stage Ib non-small cell lung cancer status post SBRT under the care of Dr. Isidore Moos and he is followed with her for monitoring of his condition. The patient was referred to [place] for evaluation of MGUS and to rule out multiple myeloma.  His protein study showed elevated light chains but his bone marrow biopsy and aspirate showed only 5% of plasma cells. I discussed the  biopsy results with the patient and his wife and recommended for him to continue on observation with repeat myeloma panel in 6 months. The patient was advised to call immediately if he has any other concerning symptoms in the interval. The patient voices understanding of current disease status and treatment options and is in agreement with the current care plan.  All questions  were answered. The patient knows to call the clinic with any problems, questions or concerns. We can certainly see the patient much sooner if necessary.  Disclaimer: This note was dictated with voice recognition software. Similar sounding words can inadvertently be transcribed and may not be corrected upon review.

## 2021-03-31 ENCOUNTER — Telehealth: Payer: Self-pay | Admitting: Internal Medicine

## 2021-03-31 NOTE — Telephone Encounter (Signed)
Scheduled appt per 4/19 los - mailed letter with appt date and time   

## 2021-04-08 ENCOUNTER — Other Ambulatory Visit: Payer: Self-pay | Admitting: Family Medicine

## 2021-04-20 ENCOUNTER — Encounter: Payer: Self-pay | Admitting: Nurse Practitioner

## 2021-04-20 ENCOUNTER — Ambulatory Visit: Payer: Medicare Other | Admitting: Nurse Practitioner

## 2021-04-20 ENCOUNTER — Other Ambulatory Visit: Payer: Self-pay

## 2021-04-20 VITALS — BP 106/75 | HR 64

## 2021-04-20 DIAGNOSIS — E559 Vitamin D deficiency, unspecified: Secondary | ICD-10-CM | POA: Diagnosis not present

## 2021-04-20 DIAGNOSIS — E782 Mixed hyperlipidemia: Secondary | ICD-10-CM | POA: Diagnosis not present

## 2021-04-20 DIAGNOSIS — E1122 Type 2 diabetes mellitus with diabetic chronic kidney disease: Secondary | ICD-10-CM

## 2021-04-20 DIAGNOSIS — I10 Essential (primary) hypertension: Secondary | ICD-10-CM | POA: Diagnosis not present

## 2021-04-20 DIAGNOSIS — Z794 Long term (current) use of insulin: Secondary | ICD-10-CM

## 2021-04-20 DIAGNOSIS — N1832 Chronic kidney disease, stage 3b: Secondary | ICD-10-CM | POA: Diagnosis not present

## 2021-04-20 DIAGNOSIS — E039 Hypothyroidism, unspecified: Secondary | ICD-10-CM | POA: Diagnosis not present

## 2021-04-20 LAB — POCT GLYCOSYLATED HEMOGLOBIN (HGB A1C): Hemoglobin A1C: 6.9 % — AB (ref 4.0–5.6)

## 2021-04-20 NOTE — Patient Instructions (Signed)

## 2021-04-20 NOTE — Progress Notes (Signed)
04/20/2021  Endocrinology follow-up note   Subjective:    Patient ID: [name], male    DOB: 12/22/1945,    Past Medical History:  Diagnosis Date  . Arthritis   . Asthma    "hx of"  . Barrett's esophagus    EGD 03/23/2011 & EGD 2/09 bx proven  . BPH (benign prostatic hyperplasia)   . Cancer of parotid gland ([place]) 11/23/12   Adenocarcinoma  . Chronic abdominal pain   . Chronic constipation   . Colon polyp 03/23/2011   tubular adenoma, Dr. Gala Romney  . 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 ([place]) 07/15/2011  . CVA (cerebral infarction) 1998   right sided deficit  . Delayed gastric emptying 2018  . Diverticulosis    TCS 03/23/11 pancolonic diverticula &TCS 5/08, pancolonic diverticula  . DM (diabetes mellitus) ([place])   . Edema of lower extremity 12/21/12   bilateral   . Esotropia of left eye   . GERD (gastroesophageal reflux disease)   . Glaucoma (increased eye pressure)   . Gout   . Gout   . Hemorrhagic colitis 06/06/2012.  [name] Hemorrhoids, internal 03/23/2011   tcs by Dr. Gala Romney  . Hepatitis    esosiniphilic, tx with prednisone  . Hiatal hernia   . History of radiation therapy 05/21/18- 05/30/18   Left Lung/ 54 Gy delivered in 3 fractions of 18 Gy. SBRT  . HTN (hypertension)   . Hx of radiation therapy 1974   right base of skull area-lymphoma  . Hyperlipidemia   . Lower facial weakness    Right  . Lymphoma ([place]) 1974   XRT at [facility], right base of skull area  . Neuropathy   . Non-small cell lung cancer ([place]) dx'd 04/26/18  . Peripheral edema    R>L legs  . Rash    chronic, recurrent, R>L legs  . Renal insufficiency   . Steatohepatitis    liver biopsy 2009  . Stroke [facility]) 1998   right hemiparesis/plegia   Past Surgical History:  Procedure Laterality Date  . BIOPSY  12/01/2016   Procedure: BIOPSY;  Surgeon: Daneil Dolin, MD;  Location: [facility] SUITE;  Service: Endoscopy;;  duodenum, gastric, esophagus  . CHOLECYSTECTOMY    . COLONOSCOPY  03/23/11    Dr. Gala Romney  pancolonic diverticula, hemorrhoids, tubular adenoma.. next tcs 03/2016  . COLONOSCOPY WITH PROPOFOL N/A 12/01/2016   inadequate bowel prep precluded exam  . ESOPHAGOGASTRODUODENOSCOPY  02/05/08   goblet cell metaplasia/negative for H.pylori  . ESOPHAGOGASTRODUODENOSCOPY  03/23/11   Dr. Gala Romney, barretts, hiatal hernia  . ESOPHAGOGASTRODUODENOSCOPY (EGD) WITH PROPOFOL N/A 12/01/2016   Dr. Gala Romney: Large amount of retained gastric contents precluded completion of the stomach. Mucosal changes were found in the stomach. Erosions and somewhat scalloped appearing mucosa present, reactive gastritis/no H pylori. Barrett's esophagus noted, no dysplasia on biopsy. Duodenal biopsies taken as well, benign, no evidence of eosinophilia.  . IR FLUORO GUIDED NEEDLE PLC ASPIRATION/INJECTION LOC  12/13/2018  . IR KYPHO LUMBAR INC FX REDUCE BONE BX UNI/BIL CANNULATION INC/IMAGING  12/13/2018  . IR RADIOLOGY PERIPHERAL GUIDED IV START  12/13/2018  . IR US GUIDE VASC ACCESS LEFT  12/13/2018  . MASS BIOPSY  11/01/2012   Procedure: NECK MASS BIOPSY;  Surgeon: Ascencion Dike, MD;  Location: [facility] ORS;  Service: ENT;  Laterality: Right;  Excisional Bx Right Neck Mass; attempted external jugular cutdown of left side  . PAROTIDECTOMY  11/24/2012   Procedure: PAROTIDECTOMY;  Surgeon: Evelena Peat  Cassie Freer, MD;  Location: [place];  Service: ENT;  Laterality: N/A;  Total parotidectomy  . PLEURECTOMY    . right lymph node removal Right    behind right ear  . Right video-assisted thoracic surgery, pleurectomy, and pleurodesis  2011  . VIDEO BRONCHOSCOPY WITH ENDOBRONCHIAL NAVIGATION N/A 04/26/2018   Procedure: VIDEO BRONCHOSCOPY WITH ENDOBRONCHIAL NAVIGATION;  Surgeon: Melrose Nakayama, MD;  Location: [place];  Service: Thoracic;  Laterality: N/A;   Social History   Socioeconomic History  . Marital status: Married    Spouse name: Not on file  . Number of children: 1  . Years of education: Not on file  . Highest education level: Not on  file  Occupational History  . Occupation: disabled  Tobacco Use  . Smoking status: Former Smoker    Packs/day: 3.00    Years: 25.00    Pack years: 75.00    Types: Cigarettes    Quit date: 03/01/1997    Years since quitting: 24.1  . Smokeless tobacco: Never Used  Vaping Use  . Vaping Use: Never used  Substance and Sexual Activity  . Alcohol use: No  . Drug use: No  . Sexual activity: Not Currently  Other Topics Concern  . Not on file  Social History Narrative  . Not on file   Social Determinants of Health   Financial Resource Strain: Low Risk   . Difficulty of Paying Living Expenses: Not very hard  Food Insecurity: Not on file  Transportation Needs: Not on file  Physical Activity: Not on file  Stress: Not on file  Social Connections: Not on file   Outpatient Encounter Medications as of 04/20/2021  Medication Sig  . allopurinol (ZYLOPRIM) 300 MG tablet TAKE 1 TABLET(300 MG) BY MOUTH DAILY  . amLODipine (NORVASC) 10 MG tablet TAKE 1 TABLET(10 MG) BY MOUTH DAILY  . cloNIDine (CATAPRES) 0.2 MG tablet TAKE 1 TABLET(0.2 MG) BY MOUTH THREE TIMES DAILY (Patient taking differently: Take 0.2 mg by mouth daily.)  . diclofenac sodium (VOLTAREN) 1 % GEL Apply twice a day for affected area on ankle as needed (Patient taking differently: Apply 4 g topically 2 (two) times daily as needed. Apply twice a day for affected area on ankle as needed)  . fluticasone (FLONASE) 50 MCG/ACT nasal spray Place 2 sprays into both nostrils daily.  . furosemide (LASIX) 40 MG tablet Take 1 tablet (40 mg total) by mouth daily. May give additional dose QD PRN for increased edema.  . gabapentin (NEURONTIN) 300 MG capsule TAKE 2 CAPSULES BY MOUTH TWICE DAILY, MAY TAKE AN ADDTIONAL 2 CAPSULES MIDDAY AS NEEDED FOR PAIN  . glucose blood (ONETOUCH VERIO) test strip Use as directed to check Blood Glucose twice daily  . insulin glargine (LANTUS SOLOSTAR) 100 UNIT/ML Solostar Pen Inject 30 Units into the skin at bedtime.   . iron polysaccharides (NIFEREX) 150 MG capsule Take 1 capsule (150 mg total) by mouth daily.  . Lancets (ONETOUCH DELICA PLUS [id number]) MISC USE TO TEST BLOOD SUGAR FOUR TIMES DAILY (Patient taking differently: 1 Device by Other route in the morning, at noon, in the evening, and at bedtime.)  . lisinopril (ZESTRIL) 20 MG tablet TAKE 1 TABLET(20 MG) BY MOUTH DAILY  . lubiprostone (AMITIZA) 24 MCG capsule TAKE 1 CAPSULE(24 MCG) BY MOUTH TWICE DAILY WITH A MEAL  . metoprolol succinate (TOPROL-XL) 50 MG 24 hr tablet TAKE 1 TABLET BY MOUTH EVERY DAY WITH OR IMMEDIATELY FOLLOWING A MEAL  . mupirocin cream (BACTROBAN) 2 %  Apply 1 application topically 2 (two) times daily. Limit use for 2 weeks. Return to clinic with no improvement.  . nystatin (MYCOSTATIN/NYSTOP) powder Apply topically 3 (three) times daily. (Patient taking differently: Apply topically as needed.)  . oxyCODONE-acetaminophen (PERCOCET) 7.5-325 MG tablet Take 1 tablet by mouth every 4 (four) hours as needed.  . pantoprazole (PROTONIX) 40 MG tablet TAKE 1 TABLET(40 MG) BY MOUTH DAILY  . simvastatin (ZOCOR) 20 MG tablet TAKE 1 TABLET(20 MG) BY MOUTH AT BEDTIME  . tamsulosin (FLOMAX) 0.4 MG CAPS capsule TAKE 1 CAPSULE(0.4 MG) BY MOUTH DAILY  . tiZANidine (ZANAFLEX) 2 MG tablet TAKE 1 TABLET(2 MG) BY MOUTH TWICE DAILY AS NEEDED  . Triamcinolone Acetonide (TRIAMCINOLONE 0.1 % CREAM : EUCERIN) CREA Apply 1 application topically 2 (two) times daily as needed. (Patient taking differently: Apply 1 application topically 2 (two) times daily as needed for rash.)   No facility-administered encounter medications on file as of 04/20/2021.   ALLERGIES: No Known Allergies VACCINATION STATUS: Immunization History  Administered Date(s) Administered  . Fluad Quad(high Dose 65+) 09/06/2019, 09/02/2020  . Influenza Split 08/24/2012  . Influenza, High Dose Seasonal PF 10/31/2017, 10/02/2018  . Influenza,inj,Quad PF,6+ Mos 10/09/2013, 09/05/2014,  11/23/2015, 08/16/2016  . Moderna Sars-Covid-2 Vaccination 01/19/2020, 02/19/2020, 11/24/2020  . PFIZER(Purple Top)SARS-COV-2 Vaccination 11/24/2020  . Pneumococcal Conjugate-13 01/07/2014  . Pneumococcal Polysaccharide-23 11/25/2012  . Tdap 09/18/2019    Diabetes He presents for his follow-up diabetic visit. He has type 2 diabetes mellitus. Onset time: diagnosed approx at age 82. His disease course has been stable. There are no hypoglycemic associated symptoms. Pertinent negatives for hypoglycemia include no confusion, headaches, nervousness/anxiousness, pallor or tremors. Associated symptoms include weakness. Pertinent negatives for diabetes include no chest pain, no fatigue, no polydipsia, no polyphagia, no polyuria and no weight loss. There are no hypoglycemic complications. Symptoms are stable. Diabetic complications include a CVA and nephropathy. Risk factors for coronary artery disease include diabetes mellitus, dyslipidemia, hypertension, male sex, obesity, sedentary lifestyle and tobacco exposure. Current diabetic treatment includes insulin injections. He is compliant with treatment most of the time. His weight is decreasing steadily. He is following a generally healthy diet. When asked about meal planning, he reported none. He has had a previous visit with a dietitian. He never participates in exercise. His home blood glucose trend is fluctuating minimally. His breakfast blood glucose range is generally 70-90 mg/dl. (He presents today, accompanied by his wife (his primary caregiver), with his meter and logs showing tight fasting glycemic profile.  His POCT A1c today is 6.9% unchanged from previous visit.  There are no episodes of hypoglycemia documented.  ) An ACE inhibitor/angiotensin II receptor blocker is being taken. He does not see a podiatrist.Eye exam is current.  Hyperlipidemia This is a chronic problem. The current episode started more than 1 year ago. The problem is controlled. Recent  lipid tests were reviewed and are normal. Exacerbating diseases include chronic renal disease, diabetes, hypothyroidism and obesity. Factors aggravating his hyperlipidemia include beta blockers and fatty foods. Pertinent negatives include no chest pain or myalgias. Current antihyperlipidemic treatment includes statins. The current treatment provides moderate improvement of lipids. Compliance problems include adherence to exercise.  Risk factors for coronary artery disease include diabetes mellitus, dyslipidemia, hypertension, male sex, a sedentary lifestyle and obesity.    Review of systems  Constitutional: + Minimally fluctuating body weight,  current There is no height or weight on file to calculate BMI. , + fatigue, no subjective hyperthermia, no subjective hypothermia Eyes: no  blurry vision, no xerophthalmia ENT: no sore throat, no nodules palpated in throat, no dysphagia/odynophagia, no hoarseness Cardiovascular: no chest pain, no shortness of breath, no palpitations, + leg swelling and right arm Respiratory: no cough, no shortness of breath Gastrointestinal: no nausea/vomiting/diarrhea Musculoskeletal: no muscle/joint aches, essentially WC bound due to deconditioning and mobility deficits from previous stroke Skin: no rashes, no hyperemia Neurological: no tremors, no numbness, no tingling, no dizziness Psychiatric: no depression, no anxiety   Objective:     Vitals:   04/20/21 1003  BP: 106/75  Pulse: 64    Wt Readings from Last 3 Encounters:  03/15/21 225 lb (102.1 kg)  03/05/21 225 lb (102.1 kg)  08/14/20 250 lb (113.4 kg)   BP Readings from Last 3 Encounters:  04/20/21 106/75  03/30/21 (!) 142/88  03/17/21 132/68    Physical Exam- Limited  Constitutional:  There is no height or weight on file to calculate BMI. , not in acute distress, normal state of mind Eyes:  EOMI, no exophthalmos Neck: Supple Cardiovascular: RRR, no murmers, rubs, or gallops, + pitting edema to BLE  and RUE Respiratory: Adequate breathing efforts, no crackles, rales, rhonchi, or wheezing Musculoskeletal: no gross deformities, strength intact in all four extremities, no gross restriction of joint movements Skin:  no rashes, no hyperemia, skin tear to right upper extremity associated with edema Neurological: no tremor with outstretched hands    CMP  CMP Latest Ref Rng & Units 03/15/2021 03/03/2021 12/16/2020  Glucose 70 - 99 mg/dL 99 158(H) 105(H)  BUN 8 - 23 mg/dL 25(H) 25(H) 28(H)  Creatinine 0.61 - 1.24 mg/dL 1.75(H) 2.03(H) 1.99(H)  Sodium 135 - 145 mmol/L 139 141 143  Potassium 3.5 - 5.1 mmol/L 3.8 4.5 4.3  Chloride 98 - 111 mmol/L 104 106 105  CO2 22 - 32 mmol/L 25 25 20   Calcium 8.9 - 10.3 mg/dL 9.8 10.2 10.7(H)  Total Protein 6.5 - 8.1 g/dL - 7.7 6.8  Total Bilirubin 0.3 - 1.2 mg/dL - <0.2(L) 0.2  Alkaline Phos 38 - 126 U/L - 115 152(H)  AST 15 - 41 U/L - 9(L) 13  ALT 0 - 44 U/L - 10 10    Diabetic Labs (most recent): Lab Results  Component Value Date   HGBA1C 6.9 (H) 12/16/2020   HGBA1C 6.2 (H) 09/02/2020   HGBA1C 7.4 (H) 03/03/2020   Lipid Panel     Component Value Date/Time   CHOL 138 05/12/2020 0948   TRIG 153 (H) 05/12/2020 0948   HDL 50 05/12/2020 0948   CHOLHDL 2.8 05/12/2020 0948   VLDL 66 (H) 05/04/2017 0931   LDLCALC 64 05/12/2020 0948     Assessment & Plan:   1) Type 2 diabetes mellitus with stage 2 chronic kidney disease and CVA  He presents today, accompanied by his wife (his primary caregiver), with his meter and logs showing tight fasting glycemic profile.  His POCT A1c today is 6.9% unchanged from previous visit.  There are no episodes of hypoglycemia documented.    -  His diabetes is complicated by recurrent CVA, stage 3 renal insufficiency, and he remains at extremely high risk for more acute and chronic complications of diabetes which include CAD, CVA, CKD, retinopathy, and neuropathy. -In May 2019, he was diagnosed with non-small cell lung  cancer, status post multiple rounds of radiation treatment completed on May 30, 2018.    -Given his tight glycemic profile, he is advised to decrease his Lantus further to 30 units SQ daily at  bedtime.  -He is encouraged to monitor blood glucose readings twice daily, before breakfast and before bed and report findings less than 70 or greater than 200 for 3 tests in a row.  -Patient is not a candidate for SGLT2 inhibitors due to CKD, nor full dose Metformin, not a suitable candidate for incretin therapy.  2) Hypertension: His blood pressure is controlled to target for his age.  He is advised to continue Metoprolol 50 mg po daily, Lisinopril 20 mg po daily, Lasix 40 mg po daily, Clonidine 0.2 mg po daily, and Norvasc 10 mg po daily.    3) Lipids/HPL:  -His most recent lipid panel from 05/12/20 shows controlled LDL at 64 and triglycerides of 145.  He is advised to continue Simvastatin 20 mg po daily at bedtime.  Side effects and precautions discussed with him.  4) Hypercalcemia- d/t primary hyperparathyroidism -nephrology following His repeat calcium level was normal at 9.8 on 03/15/21.  Was started on Sensipar by nephrology, according to their office notes.  5) Hypothyroidism: -There are no recent TFTs to review.  He is advised to continue Levothyroxine 88 mcg po daily before breakfast.  Will recheck thyroid function tests prior to next visit.   - We discussed about the correct intake of his thyroid hormone, on empty stomach at fasting, with water, separated by at least 30 minutes from breakfast and other medications,  and separated by more than 4 hours from calcium, iron, multivitamins, acid reflux medications (PPIs). -Patient is made aware of the fact that thyroid hormone replacement is needed for life, dose to be adjusted by periodic monitoring of thyroid function tests.  6) Vitamin D deficiency -His vitamin D level on 05/12/20 was 59 after replenishment treatment.  He is advised to avoid any  additional vitamin D supplementation at this time.  - I advised patient to maintain close follow up with Dr. Buelah Manis for primary care needs.     I spent 30 minutes in the care of the patient today including review of labs from [place], Lipids, Thyroid Function, Hematology (current and previous including abstractions from other facilities); face-to-face time discussing  his blood glucose readings/logs, discussing hypoglycemia and hyperglycemia episodes and symptoms, medications doses, his options of short and long term treatment based on the latest standards of care / guidelines;  discussion about incorporating lifestyle medicine;  and documenting the encounter.    Please refer to Patient Instructions for Blood Glucose Monitoring and Insulin/Medications Dosing Guide"  in media tab for additional information. Please  also refer to " Patient Self Inventory" in the Media  tab for reviewed elements of pertinent patient history.  [name] participated in the discussions, expressed understanding, and voiced agreement with the above plans.  All questions were answered to his satisfaction. he is encouraged to contact clinic should he have any questions or concerns prior to his return visit.   Follow up plan: Return in about 4 months (around 08/21/2021) for Diabetes F/U with A1c in office, Thyroid follow up, Previsit labs, Bring meter and logs.  Rayetta Pigg, [facility] [address] [place],  [address] Phone: [phone] Fax: [phone]  04/20/2021, 10:19 AM

## 2021-04-21 DIAGNOSIS — D631 Anemia in chronic kidney disease: Secondary | ICD-10-CM | POA: Diagnosis not present

## 2021-04-21 DIAGNOSIS — Z79899 Other long term (current) drug therapy: Secondary | ICD-10-CM | POA: Diagnosis not present

## 2021-04-21 DIAGNOSIS — E1122 Type 2 diabetes mellitus with diabetic chronic kidney disease: Secondary | ICD-10-CM | POA: Diagnosis not present

## 2021-04-21 DIAGNOSIS — R809 Proteinuria, unspecified: Secondary | ICD-10-CM | POA: Diagnosis not present

## 2021-04-21 DIAGNOSIS — N189 Chronic kidney disease, unspecified: Secondary | ICD-10-CM | POA: Diagnosis not present

## 2021-04-28 DIAGNOSIS — E1129 Type 2 diabetes mellitus with other diabetic kidney complication: Secondary | ICD-10-CM | POA: Diagnosis not present

## 2021-04-28 DIAGNOSIS — I5032 Chronic diastolic (congestive) heart failure: Secondary | ICD-10-CM | POA: Diagnosis not present

## 2021-04-28 DIAGNOSIS — D631 Anemia in chronic kidney disease: Secondary | ICD-10-CM | POA: Diagnosis not present

## 2021-04-28 DIAGNOSIS — N189 Chronic kidney disease, unspecified: Secondary | ICD-10-CM | POA: Diagnosis not present

## 2021-04-28 DIAGNOSIS — E210 Primary hyperparathyroidism: Secondary | ICD-10-CM | POA: Diagnosis not present

## 2021-04-28 DIAGNOSIS — R809 Proteinuria, unspecified: Secondary | ICD-10-CM | POA: Diagnosis not present

## 2021-04-28 DIAGNOSIS — E1122 Type 2 diabetes mellitus with diabetic chronic kidney disease: Secondary | ICD-10-CM | POA: Diagnosis not present

## 2021-05-13 ENCOUNTER — Telehealth: Payer: Self-pay | Admitting: Pharmacist

## 2021-05-13 ENCOUNTER — Other Ambulatory Visit: Payer: Self-pay | Admitting: Family Medicine

## 2021-05-13 NOTE — Progress Notes (Addendum)
Chronic Care Management Pharmacy Assistant   Name: [name]  MRN: [id number] DOB: [birth year]  Reason for Encounter: General Disease State Call   Conditions to be addressed/monitored: Hypertension, GERD/barretts Esophagus, Type II DM, hypothyroidism, CKD, hyperlipidemia, CVA.  Recent office visits:  03/17/21 Eulogio Bear, NP. For skin ulcer of shoulder. STARTED Mupirocin Calcium 2%  1 application Topical 2 times daily, Limit use for 2 weeks. Return to clinic with no improvement.  Recent consult visits:  04/28/21 Nephrology Liana Gerold, MBBS For CKD follow-up. No medication changes. 04/20/21 Endo Brita Romp, NP. For type 2 DM. No medication changes. 03/30/21 Oncology Mohamed, [place]. MD. monoclonal gammopathy of unknown significance. No medication changes. 03/05/21 Tyrone Schimke, MD. For follow-up. No medication changes.  Hospital visits:  None since 03/04/21  Medications: Outpatient Encounter Medications as of 05/13/2021  Medication Sig   allopurinol (ZYLOPRIM) 300 MG tablet TAKE 1 TABLET(300 MG) BY MOUTH DAILY   amLODipine (NORVASC) 10 MG tablet TAKE 1 TABLET(10 MG) BY MOUTH DAILY   cloNIDine (CATAPRES) 0.2 MG tablet TAKE 1 TABLET(0.2 MG) BY MOUTH THREE TIMES DAILY (Patient taking differently: Take 0.2 mg by mouth daily.)   diclofenac sodium (VOLTAREN) 1 % GEL Apply twice a day for affected area on ankle as needed (Patient taking differently: Apply 4 g topically 2 (two) times daily as needed. Apply twice a day for affected area on ankle as needed)   fluticasone (FLONASE) 50 MCG/ACT nasal spray Place 2 sprays into both nostrils daily.   furosemide (LASIX) 40 MG tablet Take 1 tablet (40 mg total) by mouth daily. May give additional dose QD PRN for increased edema.   gabapentin (NEURONTIN) 300 MG capsule TAKE 2 CAPSULES BY MOUTH TWICE DAILY, MAY TAKE AN ADDTIONAL 2 CAPSULES MIDDAY AS NEEDED FOR PAIN   glucose blood (ONETOUCH VERIO) test strip Use as  directed to check Blood Glucose twice daily   insulin glargine (LANTUS SOLOSTAR) 100 UNIT/ML Solostar Pen Inject 30 Units into the skin at bedtime.   iron polysaccharides (NIFEREX) 150 MG capsule Take 1 capsule (150 mg total) by mouth daily.   Lancets (ONETOUCH DELICA PLUS [id number]) MISC USE TO TEST BLOOD SUGAR FOUR TIMES DAILY (Patient taking differently: 1 Device by Other route in the morning, at noon, in the evening, and at bedtime.)   lisinopril (ZESTRIL) 20 MG tablet TAKE 1 TABLET(20 MG) BY MOUTH DAILY   lubiprostone (AMITIZA) 24 MCG capsule TAKE 1 CAPSULE(24 MCG) BY MOUTH TWICE DAILY WITH A MEAL   metoprolol succinate (TOPROL-XL) 50 MG 24 hr tablet TAKE 1 TABLET BY MOUTH EVERY DAY WITH OR IMMEDIATELY FOLLOWING A MEAL   mupirocin cream (BACTROBAN) 2 % Apply 1 application topically 2 (two) times daily. Limit use for 2 weeks. Return to clinic with no improvement.   nystatin (MYCOSTATIN/NYSTOP) powder Apply topically 3 (three) times daily. (Patient taking differently: Apply topically as needed.)   oxyCODONE-acetaminophen (PERCOCET) 7.5-325 MG tablet Take 1 tablet by mouth every 4 (four) hours as needed.   pantoprazole (PROTONIX) 40 MG tablet TAKE 1 TABLET(40 MG) BY MOUTH DAILY   simvastatin (ZOCOR) 20 MG tablet TAKE 1 TABLET(20 MG) BY MOUTH AT BEDTIME   tamsulosin (FLOMAX) 0.4 MG CAPS capsule TAKE 1 CAPSULE(0.4 MG) BY MOUTH DAILY   tiZANidine (ZANAFLEX) 2 MG tablet TAKE 1 TABLET(2 MG) BY MOUTH TWICE DAILY AS NEEDED   Triamcinolone Acetonide (TRIAMCINOLONE 0.1 % CREAM : EUCERIN) CREA Apply 1 application topically 2 (two) times daily as needed. (Patient  taking differently: Apply 1 application topically 2 (two) times daily as needed for rash.)   No facility-administered encounter medications on file as of 05/13/2021.   GEN CALL: She stated his feet is still swelling on the side he's on paralyzed on. She stated the rx states she can give him a extra pill if the swelling is increased but she want sure  how to determine that. We dicussed ways to figure out if the swelling has been increased and we discussed measuring his legs with a measuring tape. She stated he eats well for breakfast and usually a sandwich for lunch. She stated he drinks 4 cups of water and coffee daily.  Star Rating Drugs: Gabapentin 300 mg 90 DS 02/22/21 Lisinopril 20 mg 90 FD 04/29/21, Simvastatin 20 mg 90 DS 02/19/21   Follow-Up:Pharmacist Review  Charlann Lange, RMA Clinical Pharmacist Assistant [phone]  10 minutes spent in review, coordination, and documentation.  Reviewed by: Beverly Milch, PharmD Clinical Pharmacist [facility] [phone]

## 2021-05-17 ENCOUNTER — Other Ambulatory Visit: Payer: Self-pay | Admitting: Family Medicine

## 2021-05-18 ENCOUNTER — Other Ambulatory Visit: Payer: Self-pay | Admitting: *Deleted

## 2021-05-18 DIAGNOSIS — C3490 Malignant neoplasm of unspecified part of unspecified bronchus or lung: Secondary | ICD-10-CM | POA: Diagnosis not present

## 2021-05-18 DIAGNOSIS — D508 Other iron deficiency anemias: Secondary | ICD-10-CM | POA: Diagnosis not present

## 2021-05-18 DIAGNOSIS — R1031 Right lower quadrant pain: Secondary | ICD-10-CM | POA: Diagnosis not present

## 2021-05-18 DIAGNOSIS — I69351 Hemiplegia and hemiparesis following cerebral infarction affecting right dominant side: Secondary | ICD-10-CM | POA: Diagnosis not present

## 2021-05-18 DIAGNOSIS — K5903 Drug induced constipation: Secondary | ICD-10-CM | POA: Diagnosis not present

## 2021-05-18 NOTE — Telephone Encounter (Signed)
Received call from patient spouse, [name].   Requested refill on Oxycodone/APAP.   Ok to refill??  Last office visit 03/17/2021.  Last refill 01/26/2021.

## 2021-05-19 MED ORDER — OXYCODONE-ACETAMINOPHEN 7.5-325 MG PO TABS: 1.0000 | ORAL_TABLET | ORAL | 0 refills | Status: DC | PRN

## 2021-05-19 NOTE — Telephone Encounter (Signed)
PDMP reviewed.  Patient appears to be on this medication chronically.  Refill given in place of PCP who is out of office.

## 2021-05-21 ENCOUNTER — Other Ambulatory Visit: Payer: Self-pay | Admitting: *Deleted

## 2021-05-21 MED ORDER — POLYSACCHARIDE IRON COMPLEX 150 MG PO CAPS: 150.0000 mg | ORAL_CAPSULE | Freq: Every day | ORAL | 2 refills | Status: DC

## 2021-05-21 MED ORDER — TIZANIDINE HCL 2 MG PO TABS: ORAL_TABLET | ORAL | 2 refills | Status: DC

## 2021-05-21 MED ORDER — LISINOPRIL 20 MG PO TABS: ORAL_TABLET | ORAL | 2 refills | Status: DC

## 2021-05-21 MED ORDER — TAMSULOSIN HCL 0.4 MG PO CAPS: ORAL_CAPSULE | ORAL | 2 refills | Status: DC

## 2021-05-21 MED ORDER — CLONIDINE HCL 0.2 MG PO TABS
0.2000 mg | ORAL_TABLET | Freq: Three times a day (TID) | ORAL | 2 refills | Status: DC
Start: 2021-05-21 — End: 2021-12-10

## 2021-05-21 MED ORDER — GABAPENTIN 300 MG PO CAPS: ORAL_CAPSULE | ORAL | 3 refills | Status: DC

## 2021-05-21 MED ORDER — SIMVASTATIN 20 MG PO TABS: ORAL_TABLET | ORAL | 2 refills | Status: DC

## 2021-05-21 MED ORDER — PANTOPRAZOLE SODIUM 40 MG PO TBEC: DELAYED_RELEASE_TABLET | ORAL | 2 refills | Status: DC

## 2021-05-21 MED ORDER — METOPROLOL SUCCINATE ER 50 MG PO TB24: ORAL_TABLET | ORAL | 2 refills | Status: DC

## 2021-05-21 MED ORDER — ALLOPURINOL 300 MG PO TABS: ORAL_TABLET | ORAL | 2 refills | Status: DC

## 2021-05-21 MED ORDER — FUROSEMIDE 40 MG PO TABS: 40.0000 mg | ORAL_TABLET | Freq: Every day | ORAL | 2 refills | Status: DC

## 2021-05-21 MED ORDER — LUBIPROSTONE 24 MCG PO CAPS: ORAL_CAPSULE | ORAL | 2 refills | Status: DC

## 2021-05-21 MED ORDER — AMLODIPINE BESYLATE 10 MG PO TABS: 10.0000 mg | ORAL_TABLET | Freq: Every day | ORAL | 2 refills | Status: DC

## 2021-05-21 NOTE — Telephone Encounter (Signed)
Received fax requesting refill on zanaflex.  Ok to refill?

## 2021-05-24 ENCOUNTER — Telehealth: Payer: Self-pay | Admitting: *Deleted

## 2021-05-24 DIAGNOSIS — R234 Changes in skin texture: Secondary | ICD-10-CM | POA: Diagnosis not present

## 2021-05-24 DIAGNOSIS — N281 Cyst of kidney, acquired: Secondary | ICD-10-CM | POA: Diagnosis not present

## 2021-05-24 DIAGNOSIS — K5730 Diverticulosis of large intestine without perforation or abscess without bleeding: Secondary | ICD-10-CM | POA: Diagnosis not present

## 2021-05-24 MED ORDER — LUBIPROSTONE 24 MCG PO CAPS: ORAL_CAPSULE | ORAL | 2 refills | Status: DC

## 2021-05-24 NOTE — Telephone Encounter (Signed)
Received fax requesting refill on Amitiza.   Prescription sent to pharmacy.

## 2021-06-18 ENCOUNTER — Other Ambulatory Visit: Payer: Self-pay | Admitting: *Deleted

## 2021-06-18 MED ORDER — LISINOPRIL 20 MG PO TABS: ORAL_TABLET | ORAL | 2 refills | Status: DC

## 2021-06-23 ENCOUNTER — Telehealth: Payer: Self-pay | Admitting: *Deleted

## 2021-06-23 DIAGNOSIS — E782 Mixed hyperlipidemia: Secondary | ICD-10-CM

## 2021-06-23 DIAGNOSIS — I10 Essential (primary) hypertension: Secondary | ICD-10-CM

## 2021-06-23 DIAGNOSIS — N182 Chronic kidney disease, stage 2 (mild): Secondary | ICD-10-CM

## 2021-06-23 DIAGNOSIS — N1831 Chronic kidney disease, stage 3a: Secondary | ICD-10-CM

## 2021-06-23 DIAGNOSIS — I69959 Hemiplegia and hemiparesis following unspecified cerebrovascular disease affecting unspecified side: Secondary | ICD-10-CM

## 2021-06-23 DIAGNOSIS — Z8673 Personal history of transient ischemic attack (TIA), and cerebral infarction without residual deficits: Secondary | ICD-10-CM

## 2021-06-23 DIAGNOSIS — M6281 Muscle weakness (generalized): Secondary | ICD-10-CM

## 2021-06-23 NOTE — Telephone Encounter (Signed)
Received call from patient wife, [name].   Reports that she will need to have surgery soon and would need assistance for respite care.   Orders placed for [facility] SW to assist with respite placement.

## 2021-06-24 ENCOUNTER — Telehealth: Payer: Self-pay | Admitting: *Deleted

## 2021-06-24 NOTE — Chronic Care Management (AMB) (Signed)
  Chronic Care Management   Note  06/24/2021 Name: [name] MRN: [id number] DOB: 10-01-1946  [name] is a 75 y.o. year old male who is a primary care patient of Dennard Schaumann, Cammie Mcgee, MD. [name] is currently enrolled in care management services. An additional referral for SW was placed.   Follow up plan: Telephone appointment with care management team member scheduled for:07/06/2021  [place] [facility]

## 2021-06-24 NOTE — Chronic Care Management (AMB) (Signed)
  Chronic Care Management   Note  06/24/2021 Name: [name] MRN: [id number] DOB: [birth year]  [name] is a 75 y.o. year old male who is a primary care patient of Dennard Schaumann, Cammie Mcgee, MD. [name] is currently enrolled in care management services. An additional referral for Social Work was placed.   Follow up plan: Unsuccessful telephone outreach attempt made. A HIPAA compliant phone message was left for the patient providing contact information and requesting a return call.  The care management team will reach out to the patient again over the next 7 days.  If patient returns call to provider office, please advise to call [place] at [phone].  [place] Management

## 2021-06-28 DIAGNOSIS — D631 Anemia in chronic kidney disease: Secondary | ICD-10-CM | POA: Diagnosis not present

## 2021-06-28 DIAGNOSIS — E1129 Type 2 diabetes mellitus with other diabetic kidney complication: Secondary | ICD-10-CM | POA: Diagnosis not present

## 2021-06-28 DIAGNOSIS — R809 Proteinuria, unspecified: Secondary | ICD-10-CM | POA: Diagnosis not present

## 2021-06-28 DIAGNOSIS — N189 Chronic kidney disease, unspecified: Secondary | ICD-10-CM | POA: Diagnosis not present

## 2021-06-28 DIAGNOSIS — E1122 Type 2 diabetes mellitus with diabetic chronic kidney disease: Secondary | ICD-10-CM | POA: Diagnosis not present

## 2021-07-04 IMAGING — DX DG HUMERUS 2V *R*
3 series · 5 of 5 positions shown · non-contrast
Comparison: None.

CLINICAL DATA: Diffuse right arm pain after a fall from bed
weeks ago.

EXAM:
RIGHT HUMERUS - 2+ VIEW

[Series 1: humerus ap · 0.14mm/px · 2 of 2 slices shown]
[im 1/2]
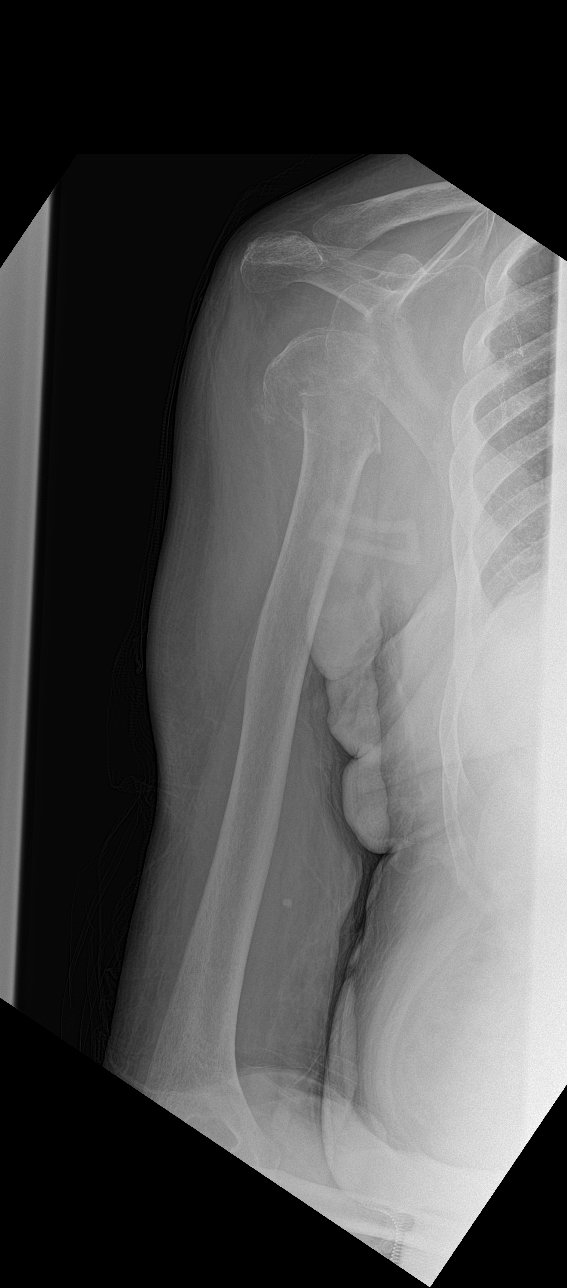
[im 2/2]
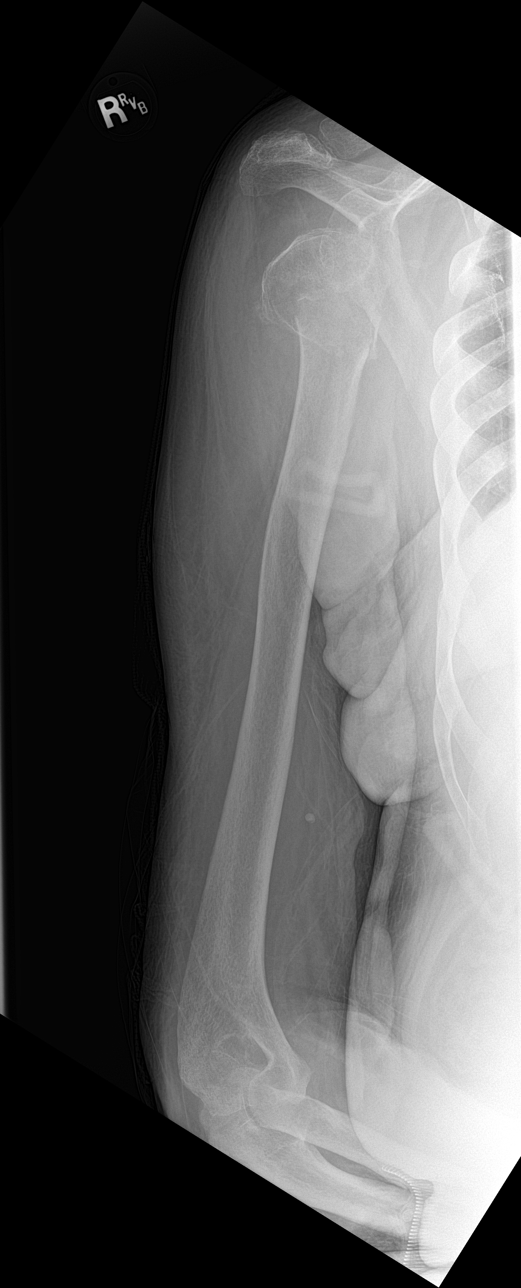

[Series 2: humerus lat · 0.14mm/px · 2 of 2 slices shown (1 of 2)]
[im 1/2]
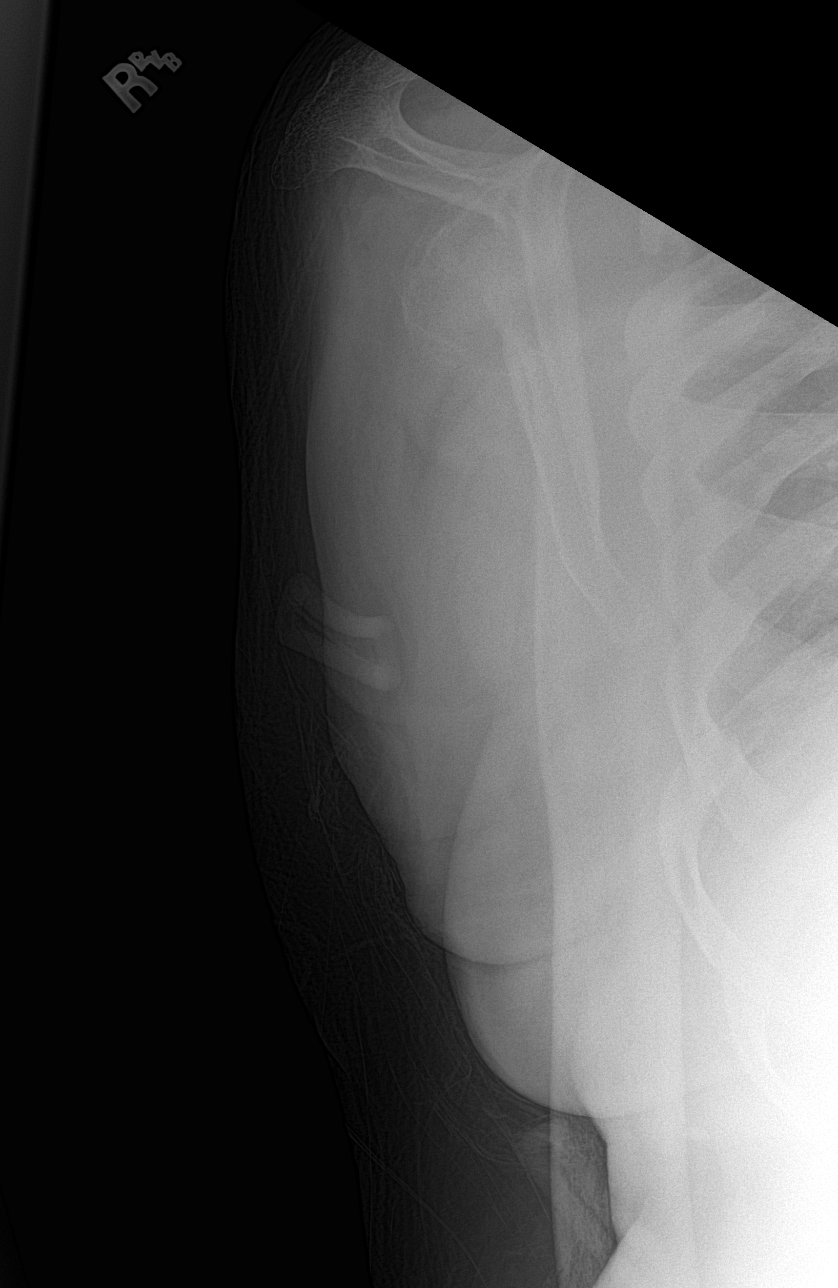
[im 2/2]
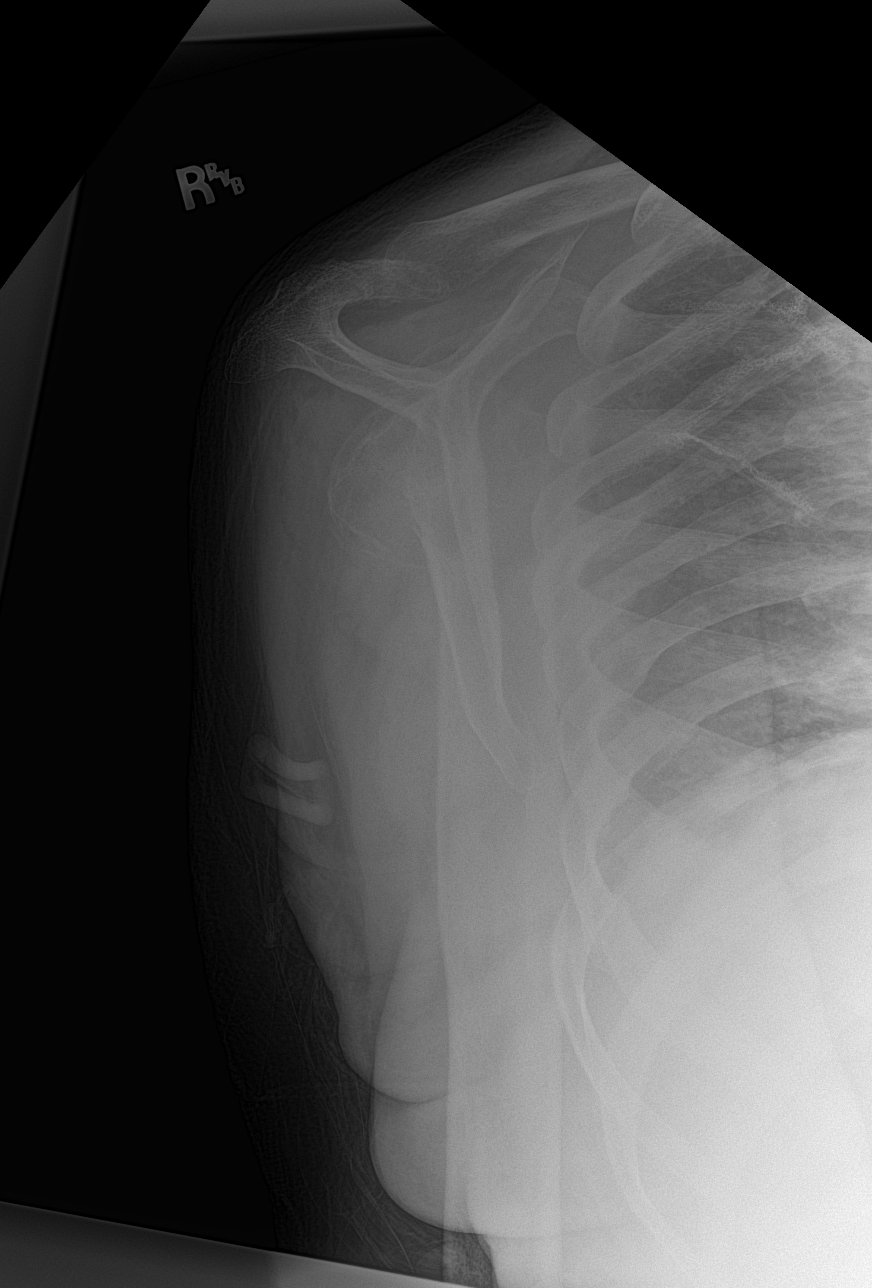

[humerus lat (2 of 2)]
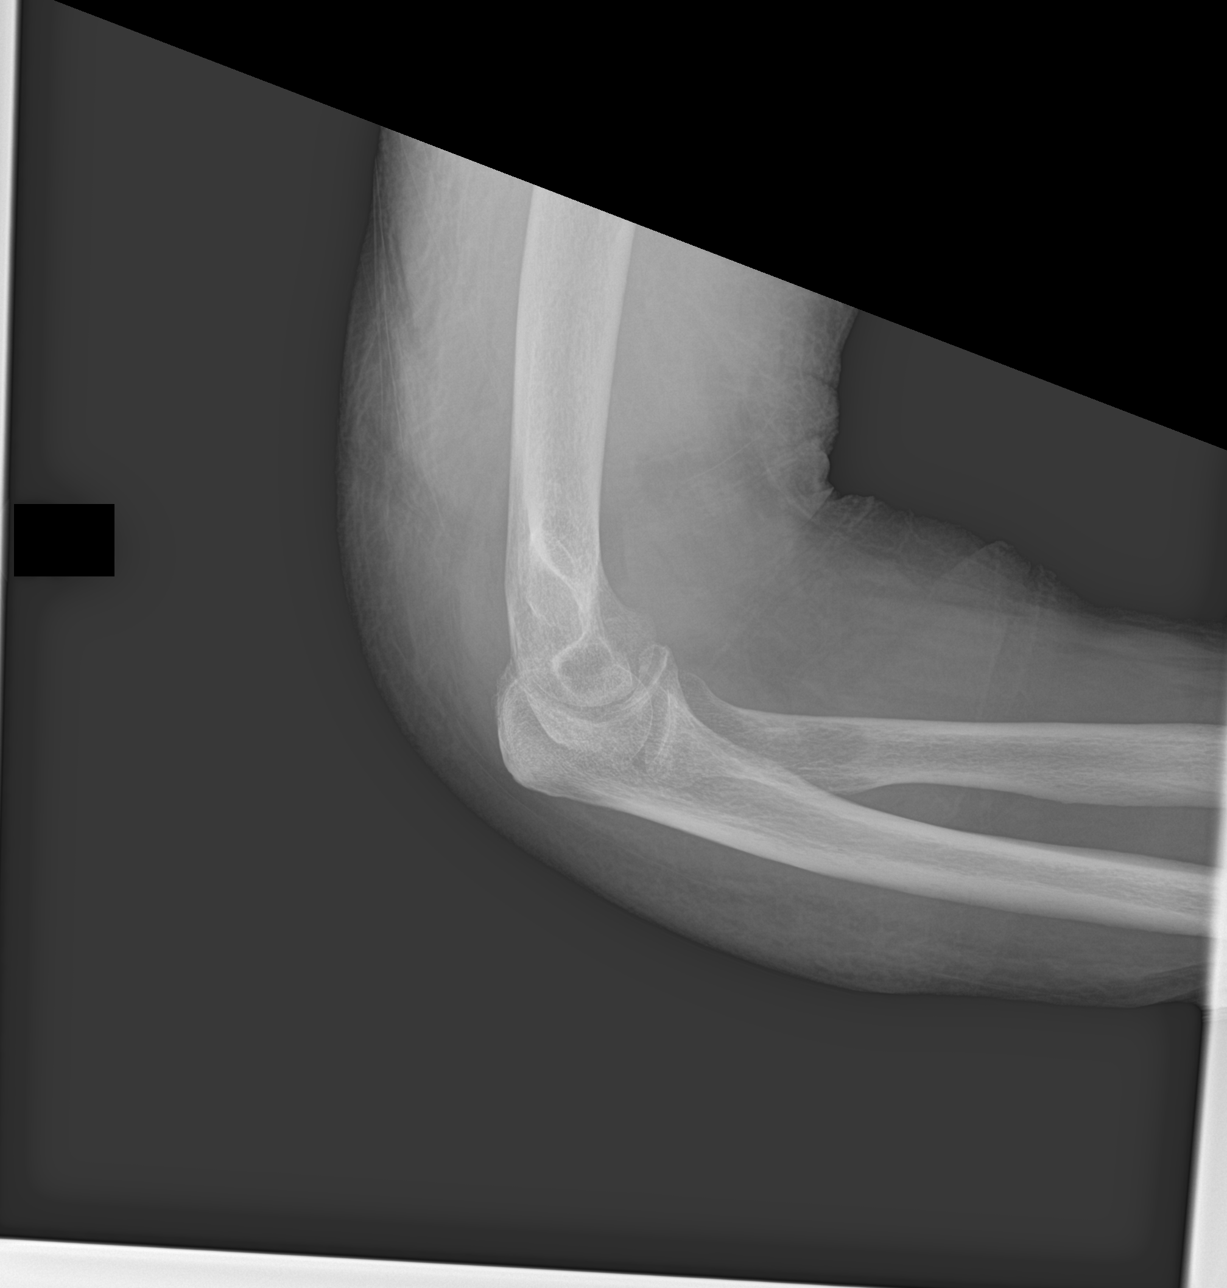

[5 of 5 positions shown; findings below may reference images not displayed]

FINDINGS: There is an acute impacted fracture of the humeral neck without
significant displacement. There is inferior subluxation of the
humeral head relative to the glenoid without dislocation. No
destructive osseous lesion is identified. The distal humerus is
intact. There is diffuse soft tissue swelling.
IMPRESSION: Impacted right humeral neck fracture.

## 2021-07-04 IMAGING — DX DG ELBOW COMPLETE 3+V*R*
4 series · 4 of 4 positions shown · non-contrast
Comparison: None.

CLINICAL DATA: Diffuse right arm pain after a fall from bed
weeks ago. History of lung cancer and lymphoma.

EXAM:
RIGHT ELBOW - COMPLETE 3+ VIEW

[elbow ap]
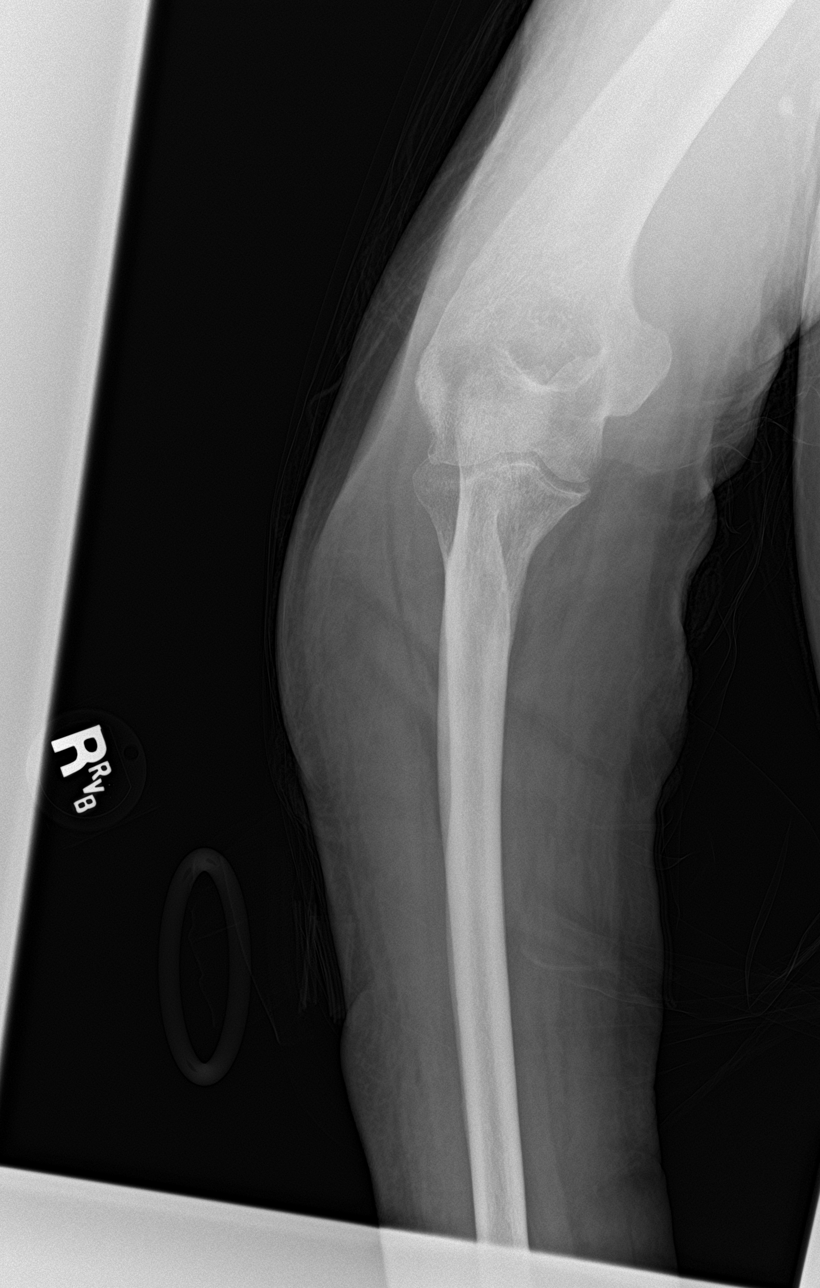

[elbow lat]
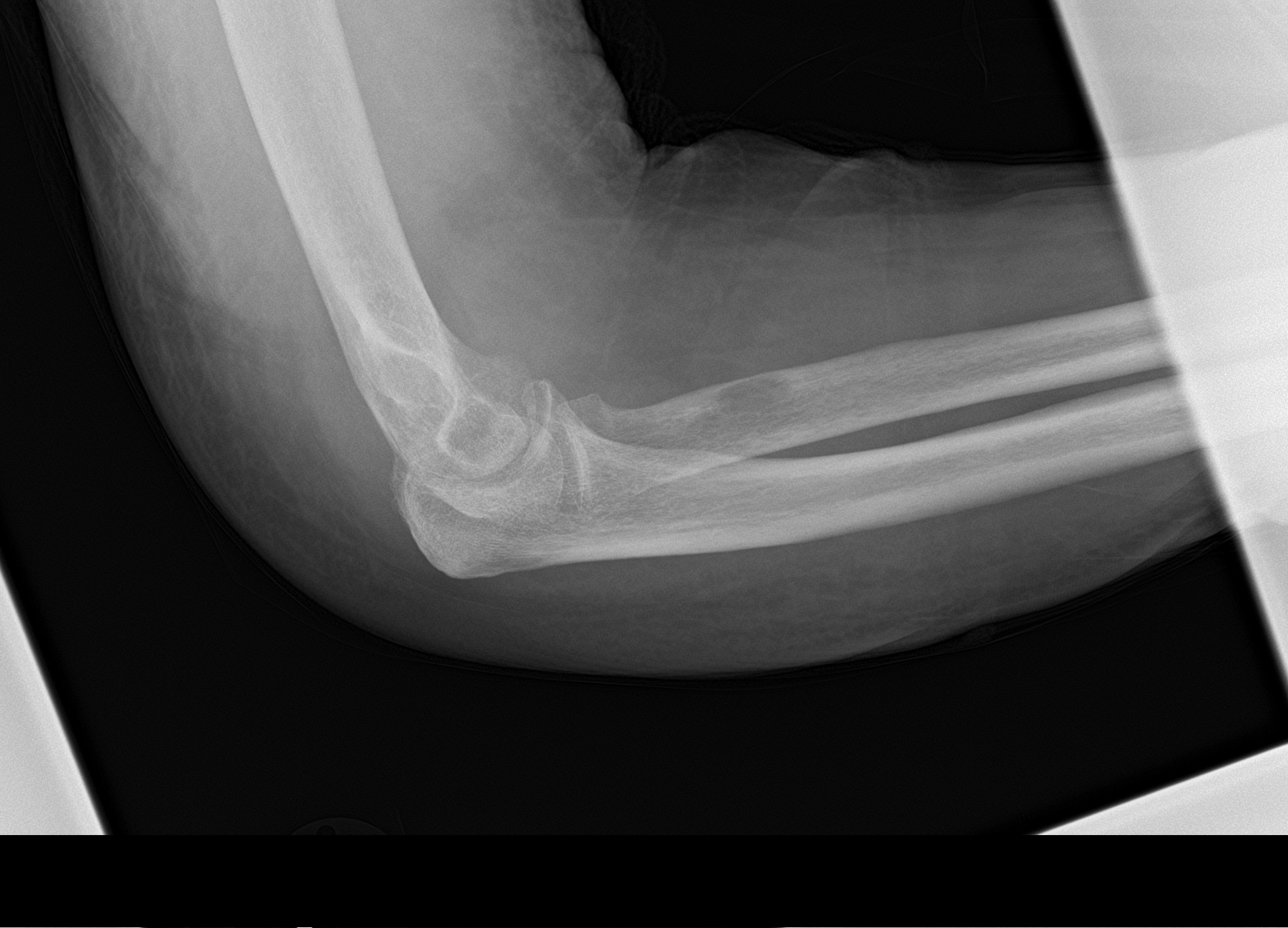

[elbow obl (1 of 2)]
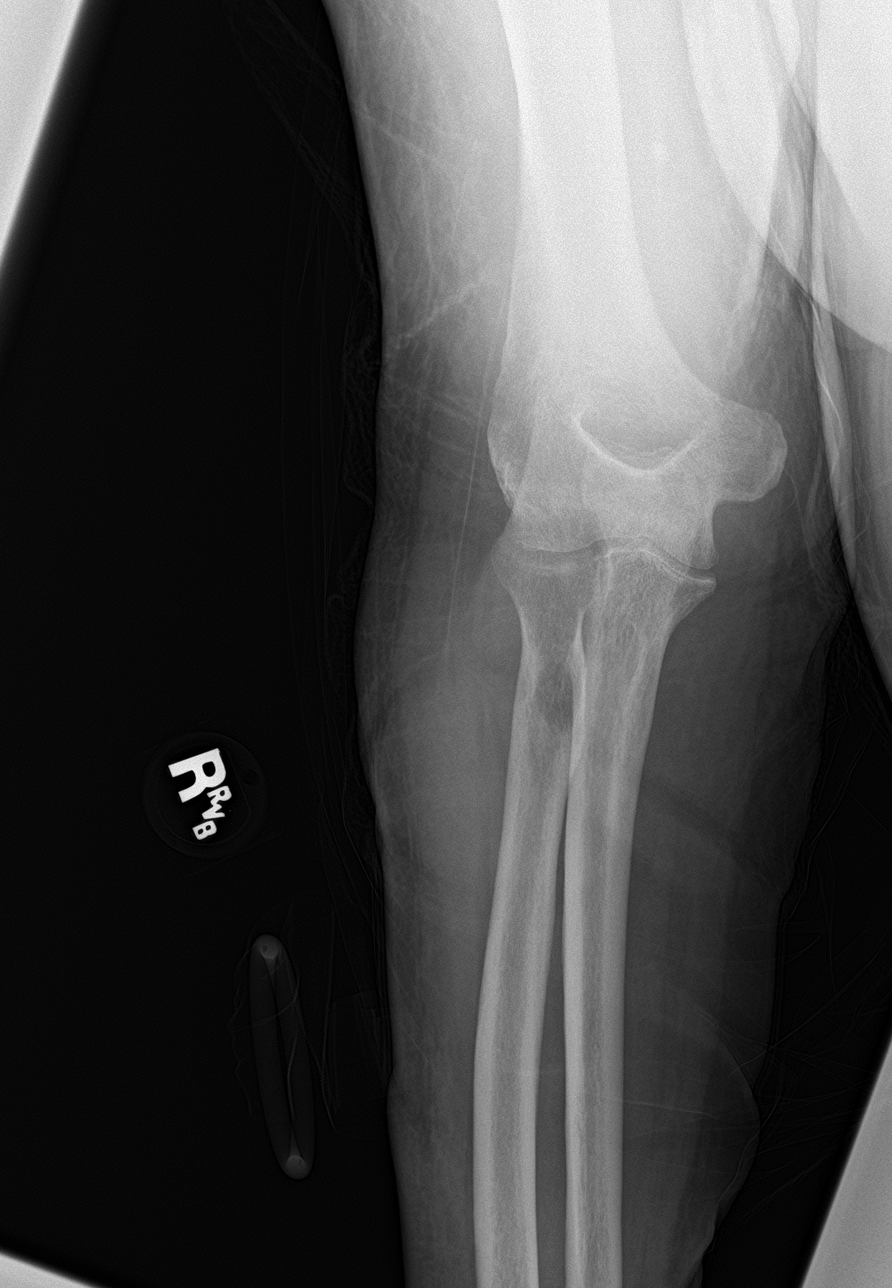

[elbow obl (2 of 2)]
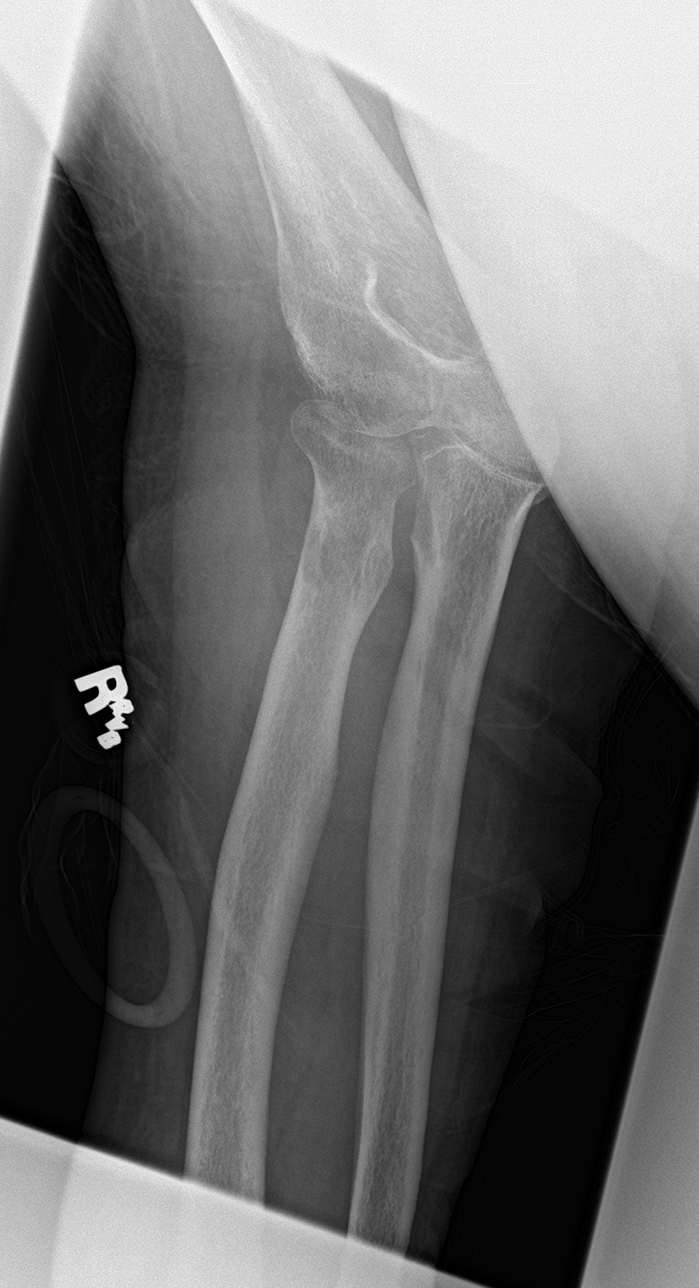

[4 of 4 positions shown; findings below may reference images not displayed]

FINDINGS: No acute fracture, dislocation, or elbow joint effusion is
identified. There is a 1.6 cm lytic lesion in the proximal shaft of
the radius without evidence of cortical disruption or periosteal
reaction. Soft tissue swelling is noted about the elbow.
IMPRESSION: 1. No acute osseous abnormality identified.
2. 1.6 cm lytic lesion in the proximal radius, indeterminate however
metastatic disease and multiple myeloma are possible.

## 2021-07-04 IMAGING — CT CT CHEST W/O CM
2 of 3 series · 15 of 36 positions shown, 18 images · non-contrast
Comparison: CT 05/18/2020

CLINICAL DATA: Non-small cell lung cancer.

EXAM:
CT CHEST WITHOUT CONTRAST
TECHNIQUE: Multidetector CT imaging of the chest was performed following the
standard protocol without IV contrast.

[Series 2: thorax · axial · 0.87mm/px · z∈[-118,+174]mm · 12 of 172 slices shown, 15 images]
[im 13/172  mediastinal]
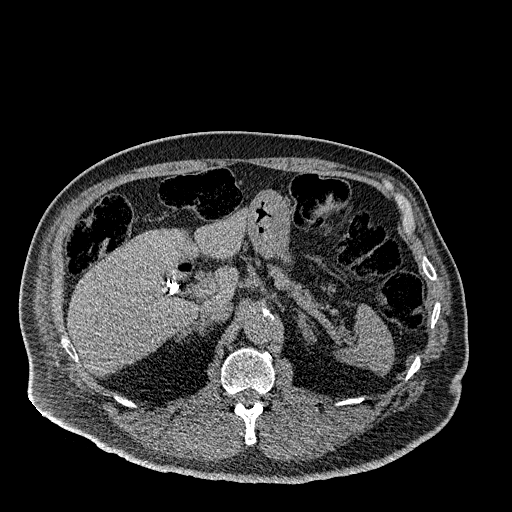
[im 13/172  lung]
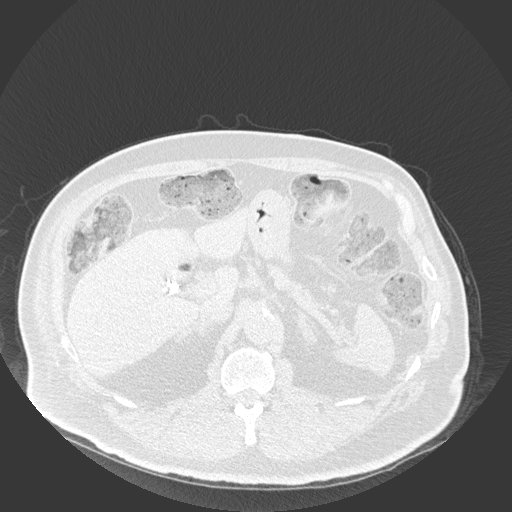
[im 26/172  lung]
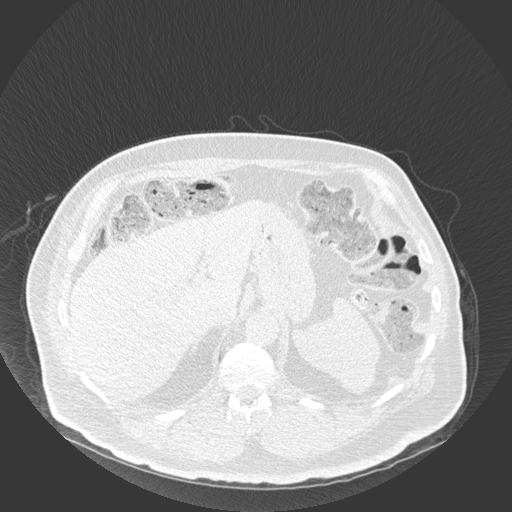
[im 39/172  lung]
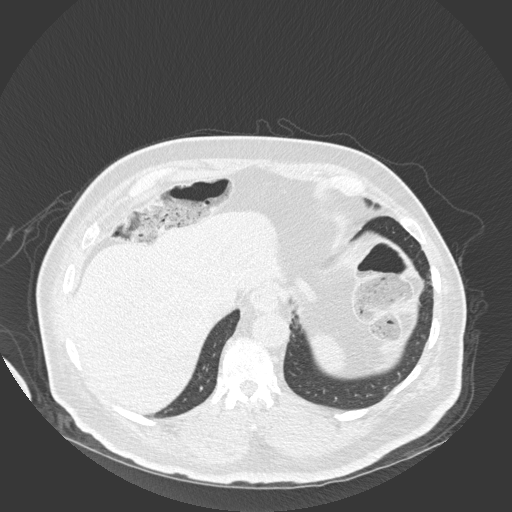
[im 51/172  lung]
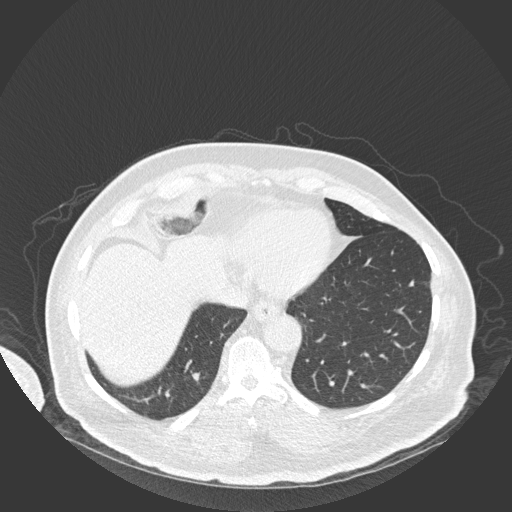
[im 64/172  mediastinal]
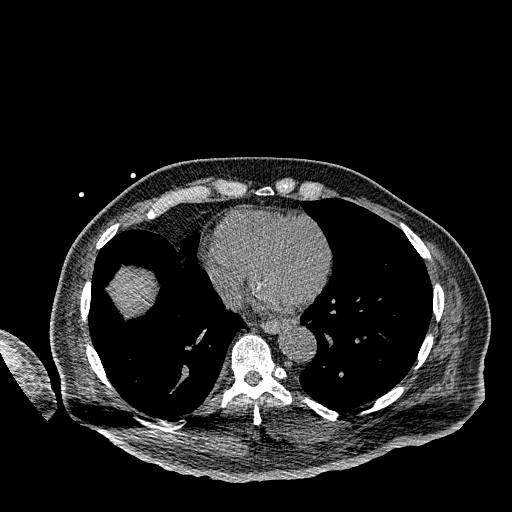
[im 64/172  lung]
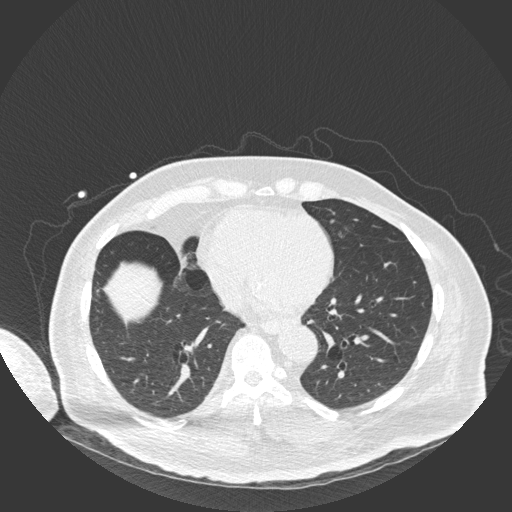
[im 77/172  lung]
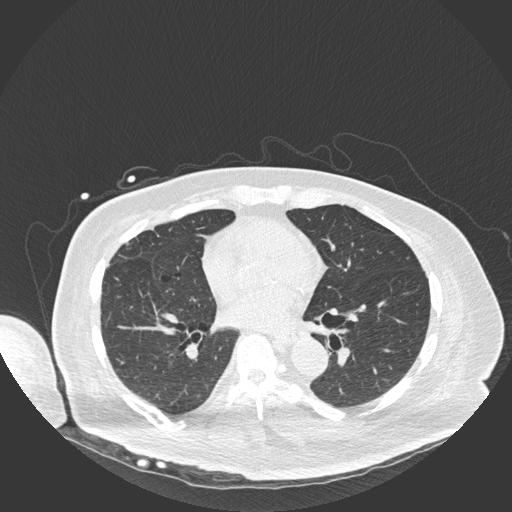
[im 96/172  lung]
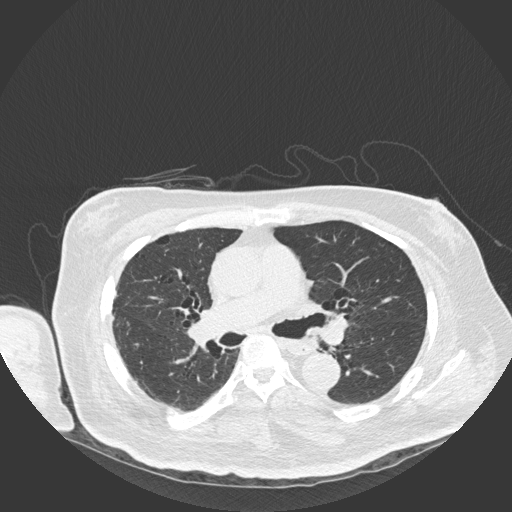
[im 108/172  lung]
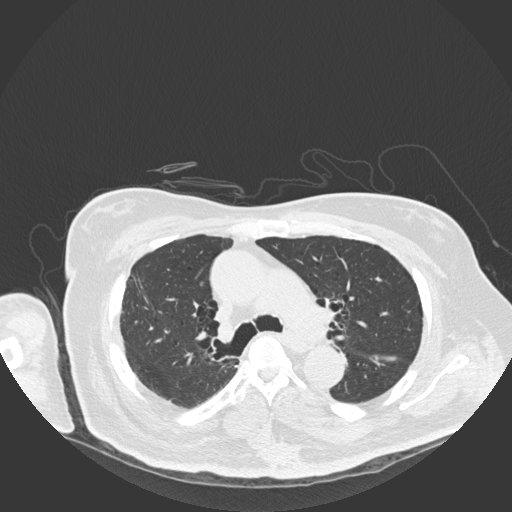
[im 121/172  mediastinal]
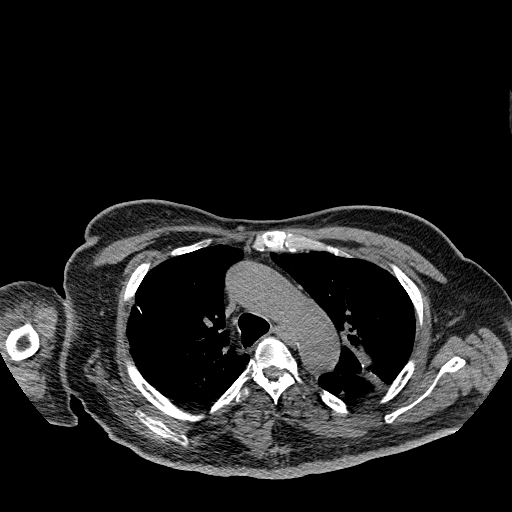
[im 121/172  lung]
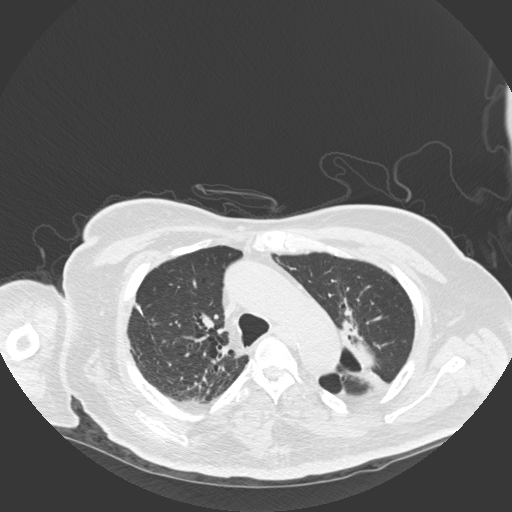
[im 134/172  lung]
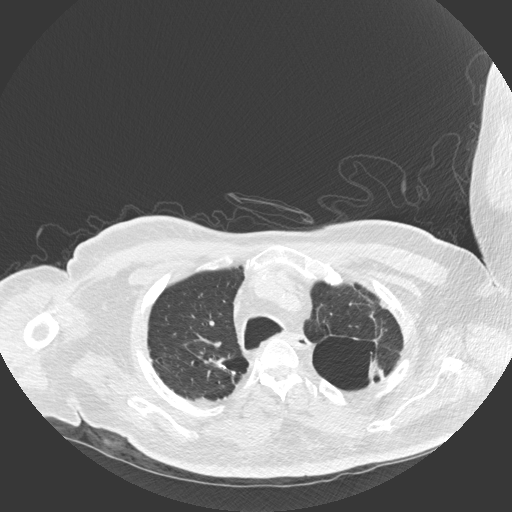
[im 146/172  lung]
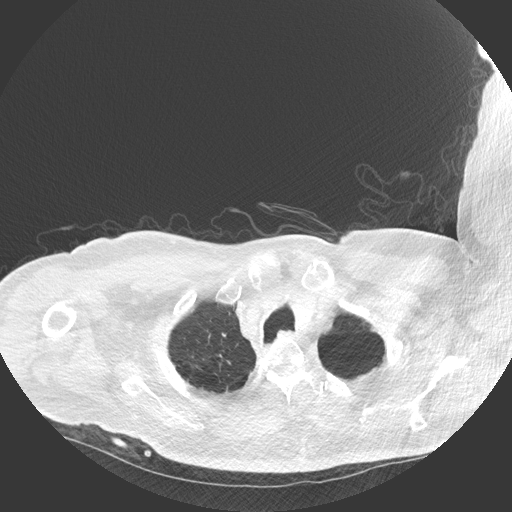
[im 159/172  lung]
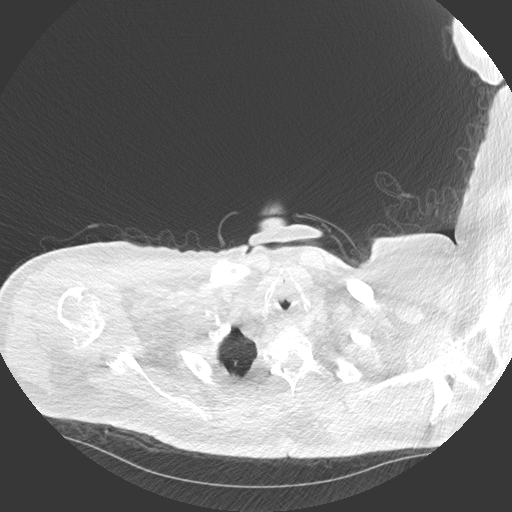

[Series 6: coronal · coronal · 0.57mm/px · 3 of 151 slices shown]
[im 31/151  lung]
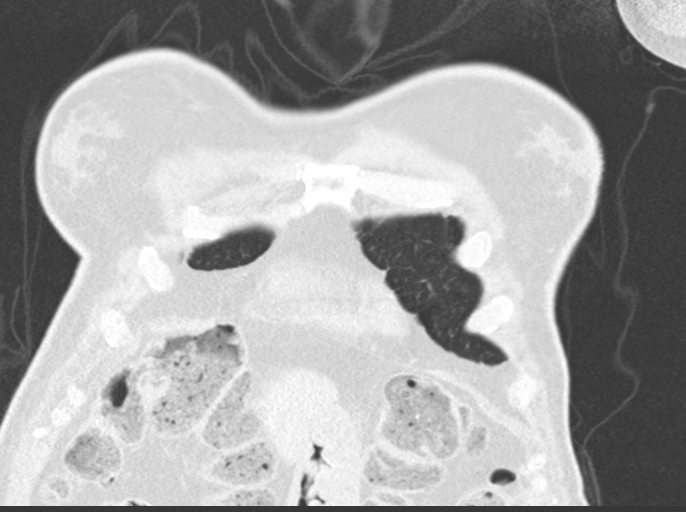
[im 61/151  lung]
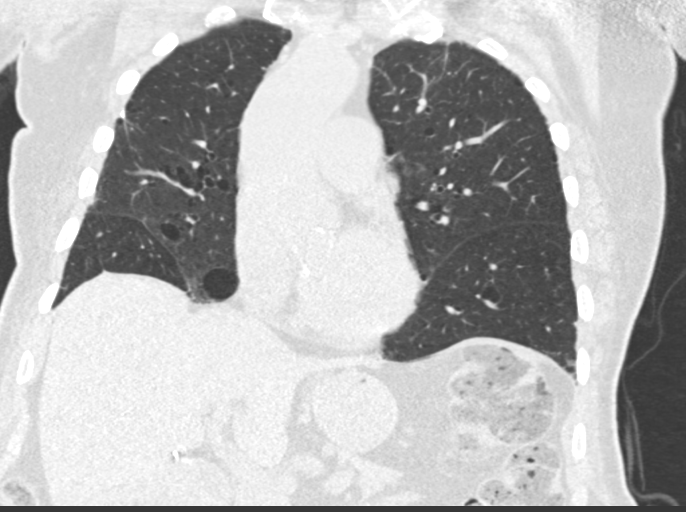
[im 91/151  lung]
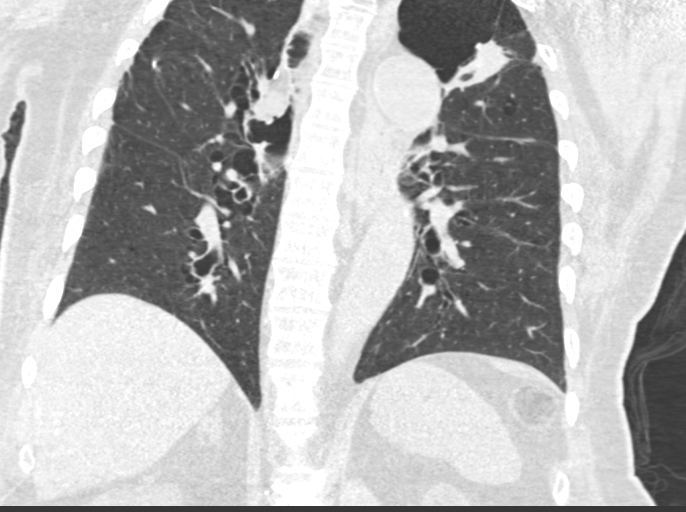

[15 of 36 positions shown; findings below may reference images not displayed]

FINDINGS: Cardiovascular: Coronary artery calcification and aortic
atherosclerotic calcification.

Mediastinum/Nodes: No axillary or supraclavicular adenopathy. No
mediastinal or hilar adenopathy. No pericardial fluid. Esophagus
normal.

Lungs/Pleura: Angular band of pleuroparenchymal thickening in the
LEFT lung apex measuring 5.3 x 2.2 cm is unchanged from 6.2 x
cm. No new or suspicious nodularity within the lungs.

Paraseptal emphysema again noted in the upper lobes.

Surgical scarring in the RIGHT upper lobe.  No nodularity.

Mild bronchiectasis in lower lobes.

Upper Abdomen: Limited view of the liver, kidneys, pancreas are
unremarkable. Normal adrenal glands.

Thickening of the adrenal glands is unchanged favored hyperplasia.

Musculoskeletal: No acute osseous abnormality.
IMPRESSION: 1. Stable bandlike pleuroparenchymal thickening in the LEFT lung
apex.
2. Stable postsurgical change in the RIGHT upper lobe.
3. No evidence of lung cancer recurrence.
4. Aortic Atherosclerosis (KAZS5-AF1.1) and Emphysema ([id number].A).

## 2021-07-04 IMAGING — DX DG WRIST COMPLETE 3+V*R*
3 series · 3 of 3 positions shown · non-contrast
Comparison: None.

CLINICAL DATA: Diffuse right arm pain after a fall from bed
weeks ago.

EXAM:
RIGHT WRIST - COMPLETE 3+ VIEW

[wrist pa]
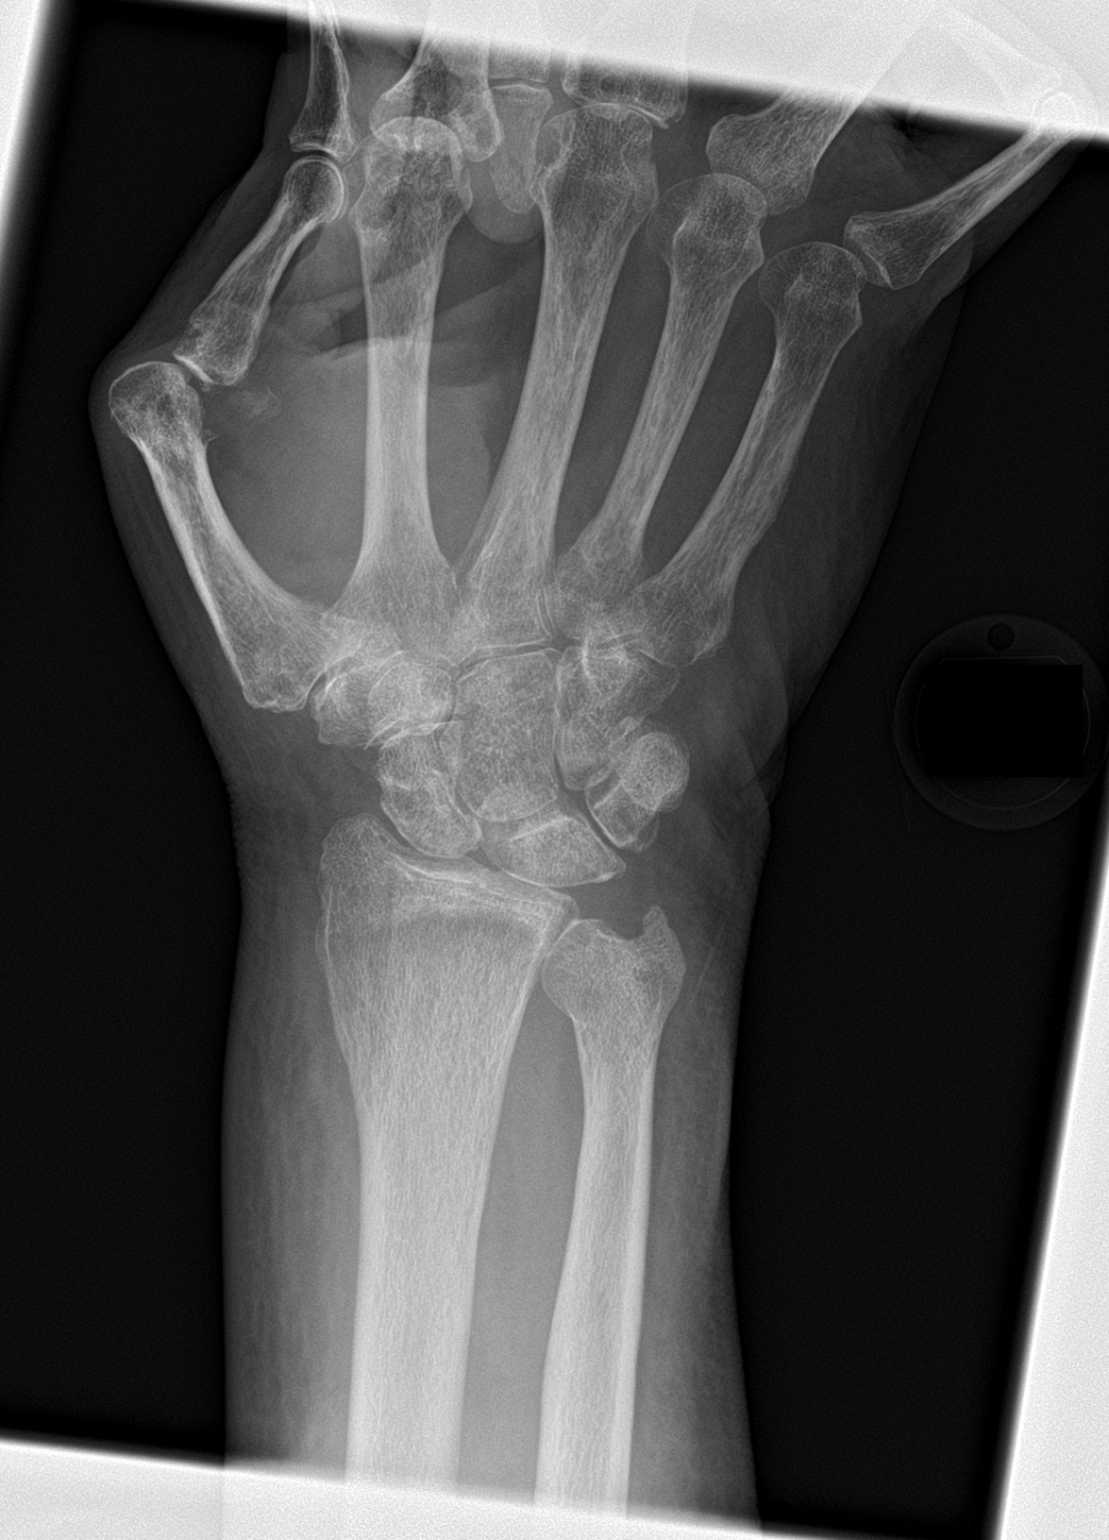

[wrist obl]
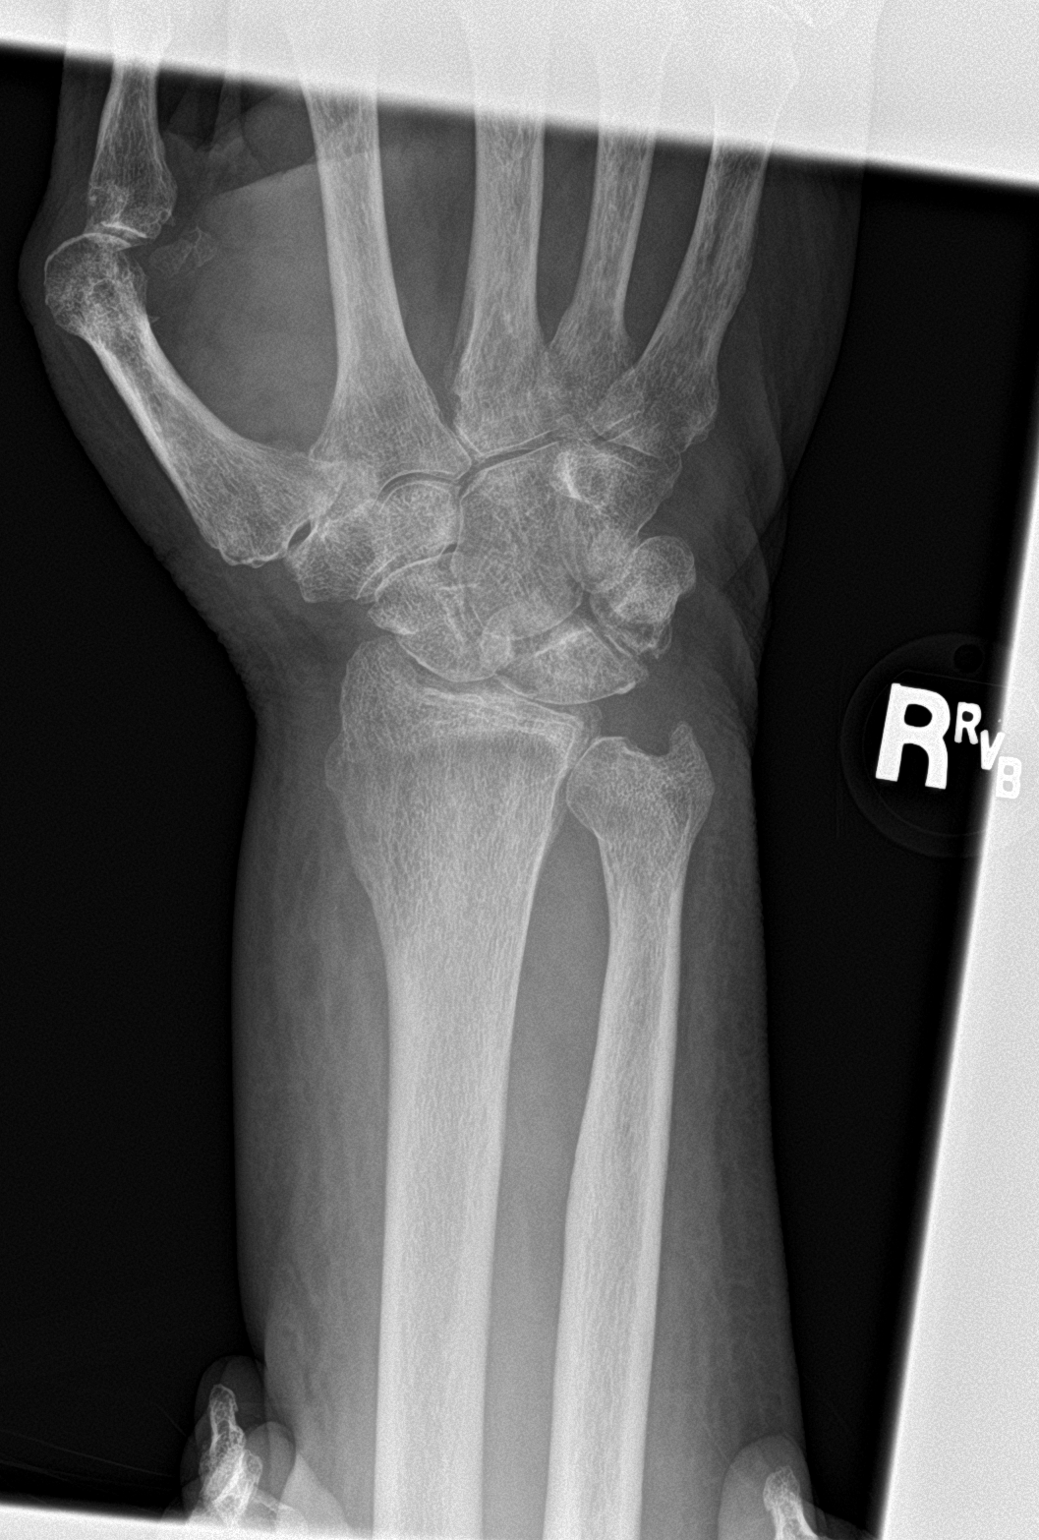

[wrist lat]
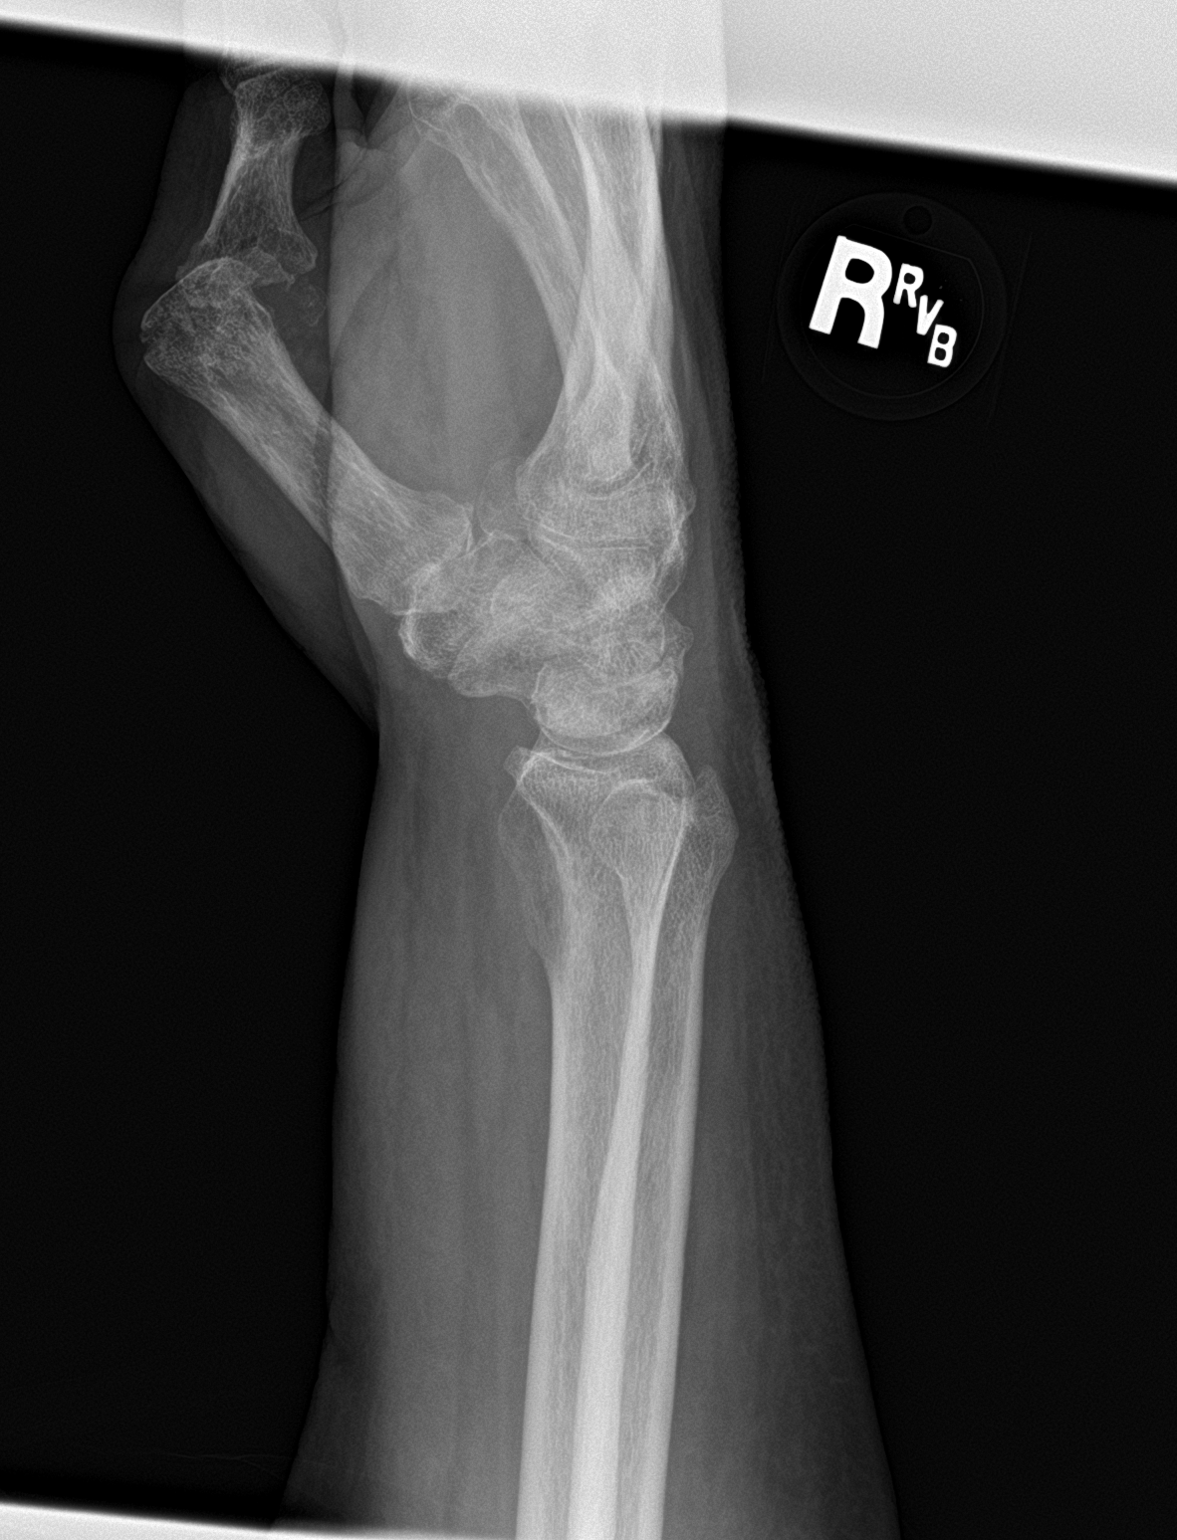

[3 of 3 positions shown; findings below may reference images not displayed]

FINDINGS: No acute fracture or dislocation is identified. The bones are
diffusely osteopenic. Arthropathy in the hand is reported
separately. There is diffuse soft tissue swelling about the wrist
and included distal forearm.
IMPRESSION: Soft tissue swelling without acute osseous abnormality identified.

## 2021-07-04 IMAGING — DX DG SHOULDER 2+V*R*
2 series · 2 of 2 positions shown · non-contrast
Comparison: None.

CLINICAL DATA: Diffuse right arm pain after a fall from bed
weeks ago.

EXAM:
RIGHT SHOULDER - 2+ VIEW

[shoulder grashey]
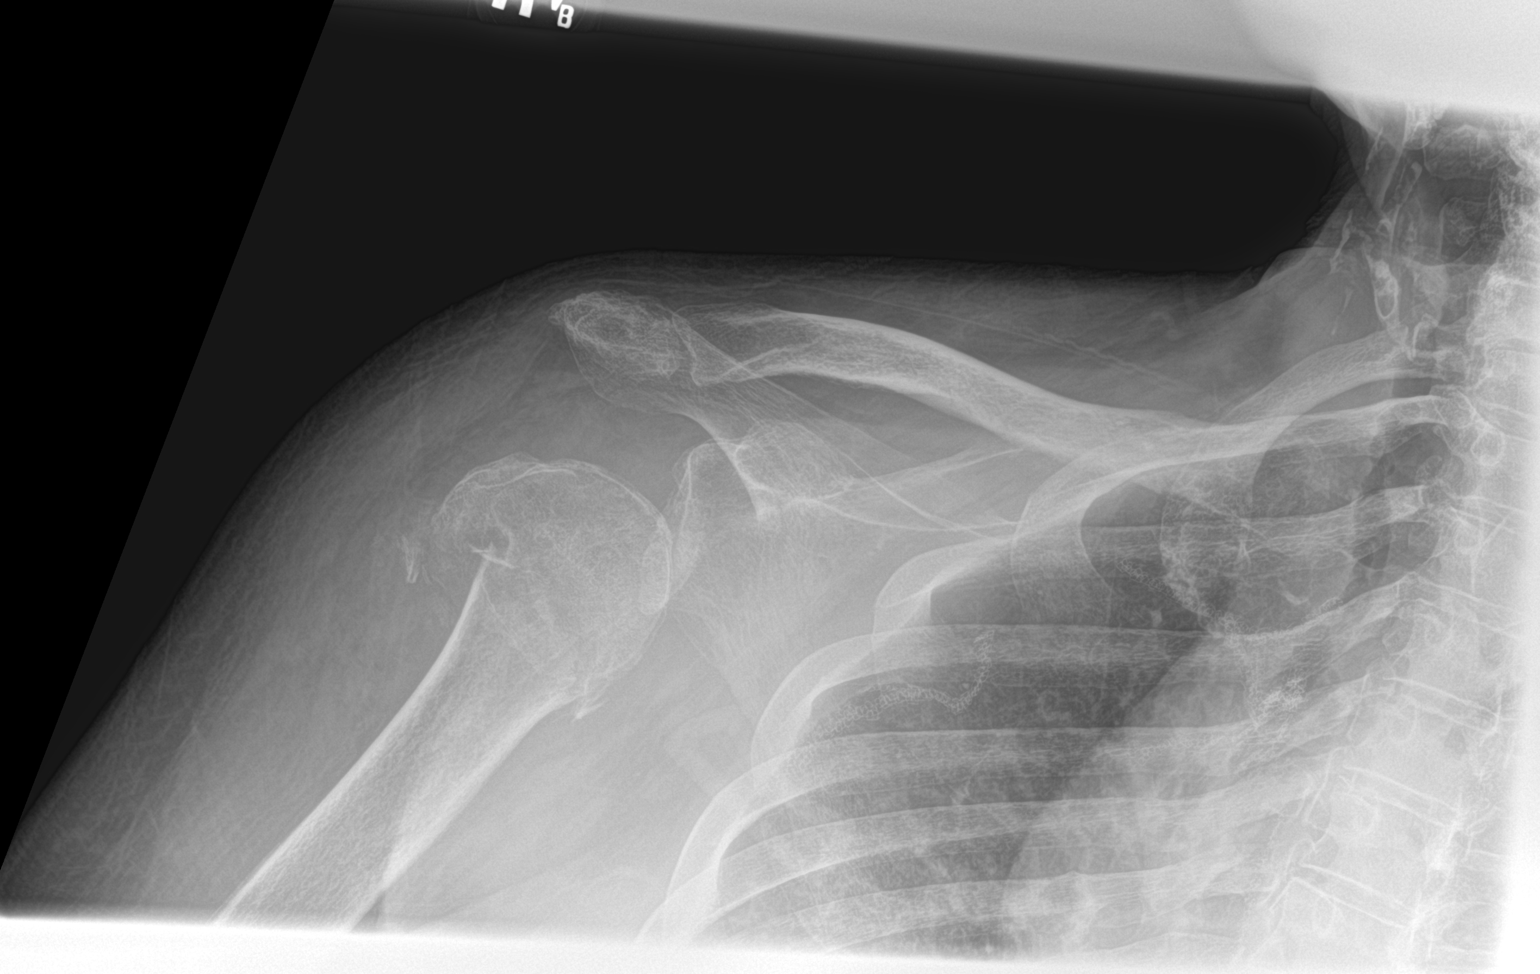

[shoulder y view]
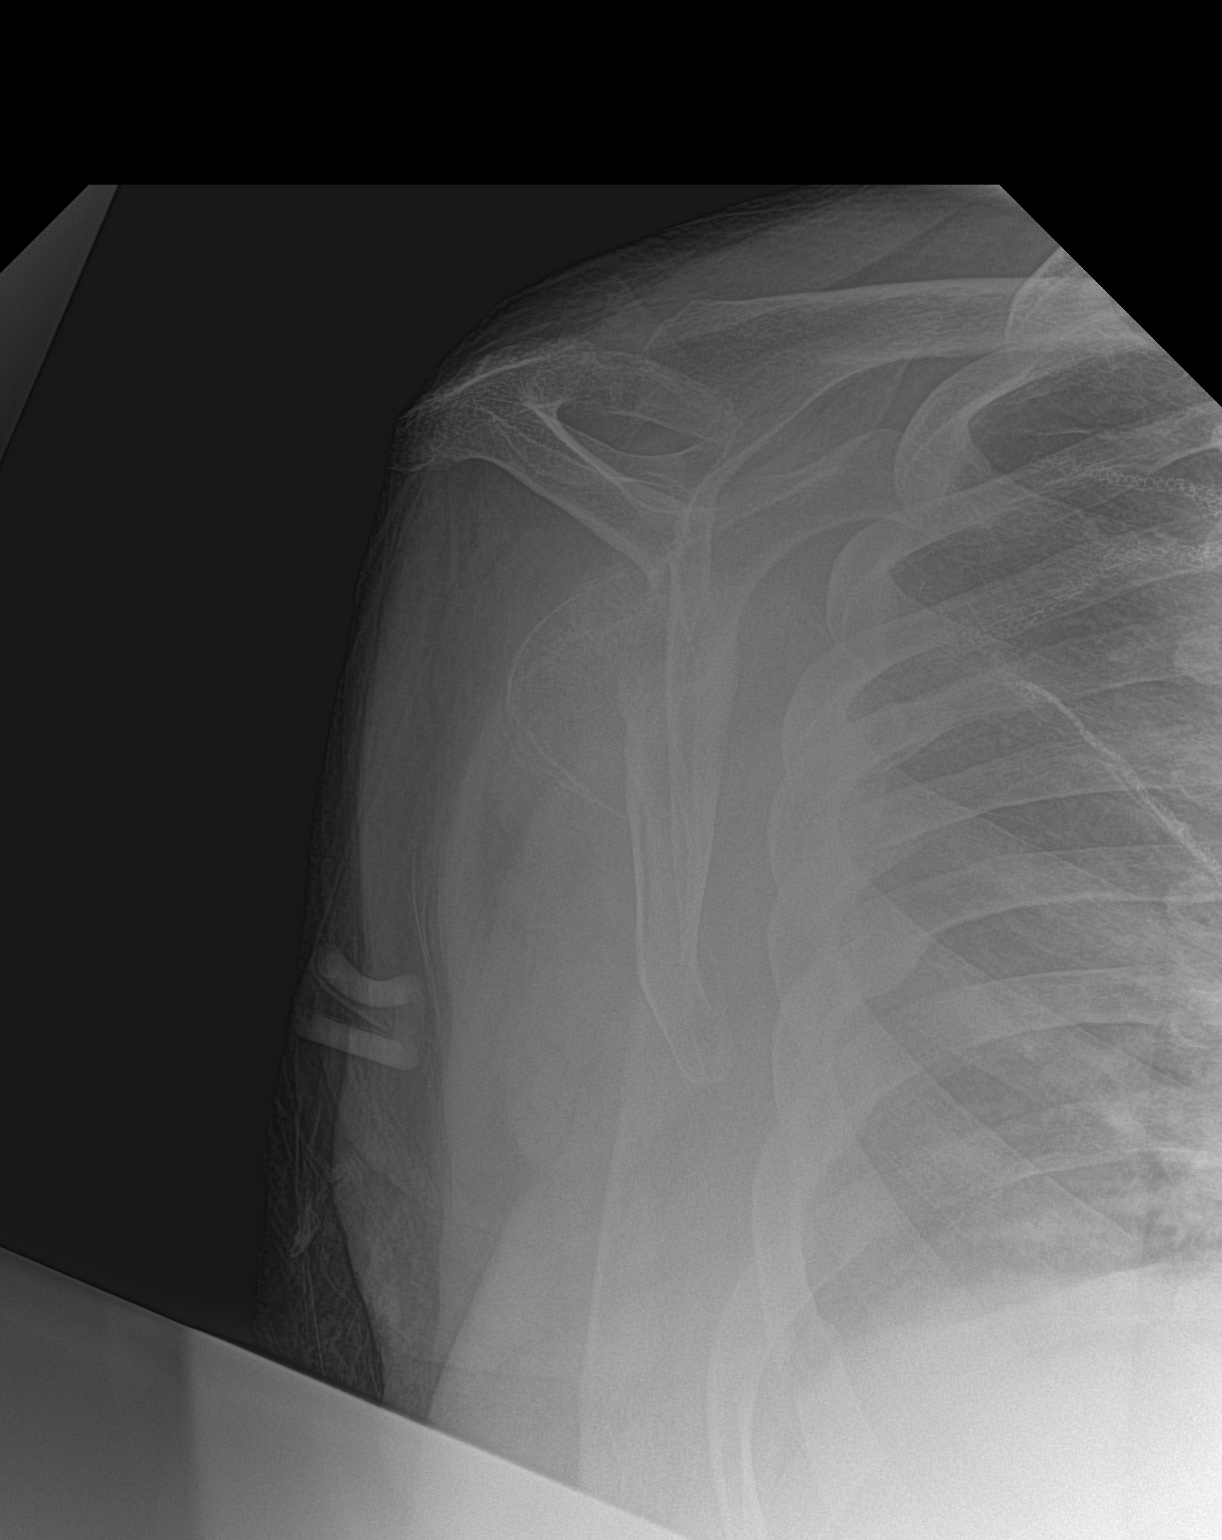

[2 of 2 positions shown; findings below may reference images not displayed]

FINDINGS: There is an impacted, mildly comminuted fracture of the humeral
neck. There is inferior subluxation of the humeral head relative to
the glenoid without dislocation. Degenerative changes are noted at
the shoulder including glenohumeral osteophytosis and subchondral
sclerosis. Postsurgical changes are noted in the right lung.
IMPRESSION: Impacted right humeral neck fracture.

## 2021-07-05 ENCOUNTER — Telehealth: Payer: Self-pay

## 2021-07-05 ENCOUNTER — Ambulatory Visit (INDEPENDENT_AMBULATORY_CARE_PROVIDER_SITE_OTHER): Payer: Medicare Other | Admitting: *Deleted

## 2021-07-05 DIAGNOSIS — E1122 Type 2 diabetes mellitus with diabetic chronic kidney disease: Secondary | ICD-10-CM

## 2021-07-05 DIAGNOSIS — I10 Essential (primary) hypertension: Secondary | ICD-10-CM

## 2021-07-05 DIAGNOSIS — Z794 Long term (current) use of insulin: Secondary | ICD-10-CM | POA: Diagnosis not present

## 2021-07-05 DIAGNOSIS — N182 Chronic kidney disease, stage 2 (mild): Secondary | ICD-10-CM

## 2021-07-05 DIAGNOSIS — Z8673 Personal history of transient ischemic attack (TIA), and cerebral infarction without residual deficits: Secondary | ICD-10-CM

## 2021-07-05 DIAGNOSIS — R531 Weakness: Secondary | ICD-10-CM

## 2021-07-05 NOTE — Patient Instructions (Signed)
Visit Information   PATIENT GOALS:   Goals Addressed             This Visit's Progress    Find Help in My Community       Timeframe:  Long-Range Goal Priority:  High Start Date:     07/05/21                        Expected End Date:    09/10/21                      Follow Up Date 07/19/21  -discuss options with family (in-home support vs facility placement) -discuss options for respite stay at family owned care home - begin a notebook of services in my neighborhood or community - call 211 when I need some help - follow-up on any referrals for help I am given - think ahead to make sure my need does not become an emergency - make a note about what I need to have by the phone or take with me, like an identification card or social security number have a back-up plan - have a back-up plan - make a list of family or friends that I can call    Why is this important?   Knowing how and where to find help for yourself or family in your neighborhood and community is an important skill.  You will want to take some steps to learn how.    Notes:         Consent to CCM Services: Mr. Carrero was given information about Chronic Care Management services today including:  CCM service includes personalized support from designated clinical staff supervised by his physician, including individualized plan of care and coordination with other care providers 24/7 contact phone numbers for assistance for urgent and routine care needs. Service will only be billed when office clinical staff spend 20 minutes or more in a month to coordinate care. Only one practitioner may furnish and bill the service in a calendar month. The patient may stop CCM services at any time (effective at the end of the month) by phone call to the office staff. The patient will be responsible for cost sharing (co-pay) of up to 20% of the service fee (after annual deductible is met).  Patient agreed to services and verbal  consent obtained.   The patient verbalized understanding of instructions, educational materials, and care plan provided today and declined offer to receive copy of patient instructions, educational materials, and care plan.   Telephone follow up appointment with care management team member scheduled for:07/19/21  Eduard Clos MSW, LCSW Licensed Clinical Social Worker [facility] [phone]   CLINICAL CARE PLAN: Patient Care Plan: General Pharmacy (Adult)     Problem Identified: hypertension, GERD/barretts Esophagus, Type II DM, hypothyroidism, CKD, hyperlipidemia, CVA.   Priority: High  Onset Date: 03/04/2021     Long-Range Goal: Patient-Specific Goal   Start Date: 03/04/2021  Expected End Date: 09/04/2021  This Visit's Progress: On track  Priority: High  Note:   Current Barriers:  No specific barriers at this time  Pharmacist Clinical Goal(s):  Patient will achieve adherence to monitoring guidelines and medication adherence to achieve therapeutic efficacy maintain control of blood pressure and blood sugar as evidenced by home monitoring  contact provider office for questions/concerns as evidenced notation of same in electronic health record through collaboration with PharmD and provider.   Interventions: 1:1 collaboration with  [name], MD regarding development and update of comprehensive plan of care as evidenced by provider attestation and co-signature Inter-disciplinary care team collaboration (see longitudinal plan of care) Comprehensive medication review performed; medication list updated in electronic medical record  Hypertension (BP goal <140/90) -Controlled -Current treatment: Amlodipine 58m daily Clonidine 0.233mTID Lisinopril 2041maily Metoprolol XL 3m60mily -Medications previously tried: none noted -Current home readings: 98/79 P70  -  141/100P 91 -Current dietary habits: up and down   -Denies hypotensive/hypertensive  symptoms -Educated on BP goals and benefits of medications for prevention of heart attack, stroke and kidney damage; Importance of home blood pressure monitoring; Symptoms of hypotension and importance of maintaining adequate hydration; -Counseled to monitor BP at home daily, document, and provide log at future appointments -Recommended to continue current medication Recommended Contact providers if BP remains elevated above 140/90 consistently.  Diabetes (A1c goal <7%) -Controlled -Current medications: Lantus 100u/ml 36 units at bedtime -Medications previously tried: none noted -Current home glucose readings fasting glucose: 75-85 post prandial glucose: 160 was the highest it has been -Denies hypoglycemic/hyperglycemic symptoms -Current meal patterns:  Some days patient is not eating as much as others -Current exercise: minimal -Educated on A1c and blood sugar goals; Prevention and management of hypoglycemic episodes; Benefits of routine self-monitoring of blood sugar; Confirmed patient taking newly reduced dose of Lantus (36 units) -Counseled to check feet daily and get yearly eye exams -Recommended to continue current medication Some concern for hypoglycemia with patients lack of food intake as well as long acting insulin.  Recently reduced dose of Lantus to 36 units, however patient reports some blood sugars in the low 70s to 80s.  Noting age would consider further decrease of insulin to see fasting sugars > 90.  Will consult with PCP on proper dosing moving forward.   Patient Goals/Self-Care Activities Patient will:  - take medications as prescribed check glucose daily, document, and provide at future appointments check blood pressure daily, document, and provide at future appointments notify providers of any hypoglycemia.  Follow Up Plan: The care management team will reach out to the patient again over the next 120 days.      Patient Care Plan: LCSW Plan of Care      Problem Identified: Social and Functional Symptoms      Long-Range Goal: Provide knowledge and resources to aide in patient care (stroke patient and caregiver needs)   Start Date: 07/05/2021  Expected End Date: 09/10/2021  This Visit's Progress: On track  Priority: High  Note:   Current barriers:   Patient in need of assistance with connecting to community resources for Limited social support, Transportation, Level of care concerns, ADL IADL limitations, Limited access to caregiver, and Lacks knowledge of community resources Acknowledges deficits with meeting this unmet need Patient is unable to independently navigate community resource options without care coordination support Clinical Goals:  patient will work with SW to address concerns related to level of care needs/options Clinical Interventions:  CSW assessed pt for needs. Per wife, she is in need of back surgery and has been putting it off because of pt's needs- pt is s/p CVA 1998 and is primarily wheelchair level of care at home.  Pt's wife wants to consider options for his care while she is gone for back surgery and during her recovery. Pt does not have [employer]TC [employer] [place]. CSW discussed with wife that pt would be at a custodial level of care and thus would need to  be private pay if placed in a facility (Assisted Living level probably). CSW had lengthy discussion with wife about levels of care, payor/coverage, and private pay options; whether it be at home or in facility. Wife is aware of the significant expense each could be given insurance does not cover.   Pt's wife shared that her step-son and his mother have a "family care home" in [place] and [place] suggested she look into this option if comfortable.  CSW also encouraged wife to discuss with the son and daughter to see if they are able to assist; both work; however, CSW advised wife they may be able to take a FMLA to care for dad in her absence.  CSW  acknowledged wife's commitment to caring for him and the difficulty she is now having due to her back- CSW also stressed to the wife the importance of taking care of herself and her needs so to not get in worse shape or have a fall/injury, etc that would jeopardize her health and ability to long term manage at home with caregiving and self care.  CSW Offered to call the adult children but she declines and will discuss further with them.  CSW offered to follow up after her appointment with back surgeon to determine where things are with planning.   Collaboration with [name], MD regarding development and update of comprehensive plan of care as evidenced by provider attestation and co-signature Inter-disciplinary care team collaboration (see longitudinal plan of care) Assessment of needs, barriers , agencies contacted, as well as how impacting  Review various resources, discussed options and provided patient information about Limited social support, Transportation, ADL IADL limitations, and Limited access to caregiver Collaborated with appropriate clinical care team members regarding patient needs Limited social support, Transportation, Level of care concerns, and ADL IADL limitations,  s/p CVA  Patient interviewed and appropriate assessments performed Discussed plans with patient for ongoing care management follow up and provided patient with direct contact information for care management team Advised patient/wife to discuss options with family; adult children, extended family,etc Provided education to patient/caregiver regarding level of care options. Other interventions provided: Solution-Focused Strategies, Active listening / Reflection utilized , Problem Solving [place] , Caregiver stress acknowledged , and Consideration of in-home help encouraged  Patient Goals:  -discuss options with family (in-home support vs facility placement) -discuss options for respite stay at family owned  care home - begin a notebook of services in my neighborhood or community - call 211 when I need some help - follow-up on any referrals for help I am given - think ahead to make sure my need does not become an emergency - make a note about what I need to have by the phone or take with me, like an identification card or social security number have a back-up plan - have a back-up plan - make a list of family or friends that I can call  -  Follow Up Plan: Appointment scheduled for SW follow up with client by phone on: 07/19/21

## 2021-07-05 NOTE — Chronic Care Management (AMB) (Signed)
Chronic Care Management    Clinical Social Work Note  07/05/2021 Name: [name] MRN: [id number] DOB: 08-24-1946  [name] is a 75 y.o. year old male who is a primary care patient of Pickard, Cammie Mcgee, MD. The CCM team was consulted to assist the patient with chronic disease management and/or care coordination needs related to: Transportation Needs , [employer] , Level of Care Concerns, and Caregiver Stress.   Engaged with patient by telephone for initial visit in response to provider referral for social work chronic care management and care coordination services.   Consent to Services:  The patient was given information about Chronic Care Management services, agreed to services, and gave verbal consent prior to initiation of services.  Please see initial visit note for detailed documentation.   Patient agreed to services and consent obtained.   Assessment: Review of patient past medical history, allergies, medications, and health status, including review of relevant consultants reports was performed today as part of a comprehensive evaluation and provision of chronic care management and care coordination services.     SDOH (Social Determinants of Health) assessments and interventions performed:    Advanced Directives Status: Not addressed in this encounter.  CCM Care Plan  No Known Allergies  Outpatient Encounter Medications as of 07/05/2021  Medication Sig   allopurinol (ZYLOPRIM) 300 MG tablet TAKE 1 TABLET(300 MG) BY MOUTH DAILY   amLODipine (NORVASC) 10 MG tablet Take 1 tablet (10 mg total) by mouth daily.   cloNIDine (CATAPRES) 0.2 MG tablet Take 1 tablet (0.2 mg total) by mouth 3 (three) times daily. TAKE 1 TABLET(0.2 MG) BY MOUTH THREE TIMES DAILY   diclofenac sodium (VOLTAREN) 1 % GEL Apply twice a day for affected area on ankle as needed (Patient taking differently: Apply 4 g topically 2 (two) times daily as needed. Apply twice a day for affected area on  ankle as needed)   fluticasone (FLONASE) 50 MCG/ACT nasal spray Place 2 sprays into both nostrils daily.   furosemide (LASIX) 40 MG tablet Take 1 tablet (40 mg total) by mouth daily. May give additional dose QD PRN for increased edema.   gabapentin (NEURONTIN) 300 MG capsule Take 2 capsules (600 mg total) by mouth 2 (two) times daily. May also take 2 capsules (600 mg total) daily as needed.   glucose blood (ONETOUCH VERIO) test strip Use as directed to check Blood Glucose twice daily   insulin glargine (LANTUS SOLOSTAR) 100 UNIT/ML Solostar Pen Inject 30 Units into the skin at bedtime.   iron polysaccharides (NIFEREX) 150 MG capsule Take 1 capsule (150 mg total) by mouth daily.   Lancets (ONETOUCH DELICA PLUS [id number]) MISC USE TO TEST BLOOD SUGAR FOUR TIMES DAILY (Patient taking differently: 1 Device by Other route in the morning, at noon, in the evening, and at bedtime.)   lisinopril (ZESTRIL) 20 MG tablet TAKE 1 TABLET(20 MG) BY MOUTH DAILY   lubiprostone (AMITIZA) 24 MCG capsule TAKE 1 CAPSULE(24 MCG) BY MOUTH TWICE DAILY WITH A MEAL   metoprolol succinate (TOPROL-XL) 50 MG 24 hr tablet Take with or immediately following a meal.   mupirocin cream (BACTROBAN) 2 % Apply 1 application topically 2 (two) times daily. Limit use for 2 weeks. Return to clinic with no improvement.   nystatin (MYCOSTATIN/NYSTOP) powder Apply topically 3 (three) times daily. (Patient taking differently: Apply topically as needed.)   oxyCODONE-acetaminophen (PERCOCET) 7.5-325 MG tablet Take 1 tablet by mouth every 4 (four) hours as needed.   pantoprazole (PROTONIX)  40 MG tablet TAKE 1 TABLET(40 MG) BY MOUTH DAILY   simvastatin (ZOCOR) 20 MG tablet TAKE 1 TABLET(20 MG) BY MOUTH AT BEDTIME   tamsulosin (FLOMAX) 0.4 MG CAPS capsule TAKE 1 CAPSULE(0.4 MG) BY MOUTH DAILY   tiZANidine (ZANAFLEX) 2 MG tablet TAKE 1 TABLET(2 MG) BY MOUTH TWICE DAILY AS NEEDED   Triamcinolone Acetonide (TRIAMCINOLONE 0.1 % CREAM : EUCERIN) CREA  Apply 1 application topically 2 (two) times daily as needed. (Patient taking differently: Apply 1 application topically 2 (two) times daily as needed for rash.)   No facility-administered encounter medications on file as of 07/05/2021.    Patient Active Problem List   Diagnosis Date Noted   MGUS (monoclonal gammopathy of unknown significance) 03/03/2021   Chronic obstructive pulmonary disease ([place]) 02/17/2021   Common variable immunodeficiency ([place]) 02/17/2021   Osteoarthrosis 02/17/2021   Diverticular disease 01/05/2021   History of cerebrovascular accident 01/05/2021   Hiatal hernia 01/05/2021   Hyperlipidemia 01/05/2021   Glaucoma 01/05/2021   Esotropia of left eye 01/05/2021   Benign prostatic hyperplasia 01/05/2021   History of radiation therapy 01/05/2021   Anemia 01/05/2021   GI bleed 03/18/2020   Acute lower GI bleeding 03/03/2020   Pressure injury of skin 03/03/2020   Thoracic aortic aneurysm without rupture ([place]) 11/19/2019   Hardening of the aorta (main artery of the heart) ([place]) 11/19/2019   Protein-calorie malnutrition ([place]) 06/10/2019   Avitaminosis D 03/07/2019   Male hypogonadism 07/26/2018   Gynecomastia 06/27/2018   Malignant neoplasm of upper lobe of left lung ([place]) 04/06/2018   History of malignant neoplasm of parotid gland 02/07/2018   Adenoma of large intestine 11/09/2016   Adult hypothyroidism 09/09/2015   Chronic constipation 05/22/2015   Impetigo bullosa 10/23/2013   Chronic left shoulder pain 09/25/2013   Muscle weakness (generalized) 09/25/2013   Hypercalcemia 08/08/2013   Chronic kidney disease, stage 3 unspecified ([place]) 08/07/2013   AKI (acute kidney injury) ([place]) 07/24/2013   Acute kidney failure, unspecified ([place]) 07/24/2013   Adhesive capsulitis of left shoulder 05/14/2013   GERD (gastroesophageal reflux disease)    Carcinoma of parotid gland ([place]) 11/23/2012   Epidermoid cyst 10/05/2012   Neck pain 08/26/2012   Chronic abdominal pain  08/26/2012   Edema of lower extremity 08/24/2012   Hemiplegia of dominant side as late effect of cerebrovascular disease ([place]) 06/06/2012   Diarrhea 04/17/2012   Alimentary obesity 04/17/2012   Type 2 diabetes mellitus with diabetic neuropathy, unspecified ([place]) 02/13/2012   Recurrent boils 01/12/2012   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 ([place]) 07/15/2011   Hemorrhoids, internal 03/23/2011   Hepatitis 03/02/2011   Steatohepatitis 03/02/2011   Abdominal pain, right upper quadrant 12/16/2010   Abnormal levels of other serum enzymes 12/16/2010   Eosinophil count raised 12/23/2009   HEMATOCHEZIA 12/23/2009   Type 2 diabetes mellitus ([place]) 01/05/2009   Gout 01/05/2009   Hypertensive disorder 01/05/2009   BARRETTS ESOPHAGUS 01/05/2009   DIVERTICULOSIS OF COLON 01/05/2009   History of malignant lymphoma 01/05/2009    Conditions to be addressed/monitored:  s/p CVA ; Level of care concerns, Limited access to caregiver, Inability to perform ADL's independently, and Inability to perform IADL's independently  Care Plan : LCSW Plan of Care  Updates made by Deirdre Peer, LCSW since 07/05/2021 12:00 AM     Problem: Social and Functional Symptoms      Long-Range Goal: Provide knowledge and resources to aide in patient care (stroke patient and caregiver needs)   Start  Date: 07/05/2021  Expected End Date: 09/10/2021  This Visit's Progress: On track  Priority: High  Note:   Current barriers:   Patient in need of assistance with connecting to community resources for Limited social support, Transportation, Level of care concerns, ADL IADL limitations, Limited access to caregiver, and Lacks knowledge of community resources Acknowledges deficits with meeting this unmet need Patient is unable to independently navigate community resource options without care coordination support Clinical Goals:  patient will work with SW to address concerns related to level of care  needs/options Clinical Interventions:  CSW assessed pt for needs. Per wife, she is in need of back surgery and has been putting it off because of pt's needs- pt is s/p CVA 1998 and is primarily wheelchair level of care at home.  Pt's wife wants to consider options for his care while she is gone for back surgery and during her recovery. Pt does not have [employer], [place] or Veteran's benefits. CSW discussed with wife that pt would be at a custodial level of care and thus would need to be private pay if placed in a facility (Assisted Living level probably). CSW had lengthy discussion with wife about levels of care, payor/coverage, and private pay options; whether it be at home or in facility. Wife is aware of the significant expense each could be given insurance does not cover.   Pt's wife shared that her step-son and his mother have a "family care home" in [place] and [place] suggested she look into this option if comfortable.  CSW also encouraged wife to discuss with the son and daughter to see if they are able to assist; both work; however, CSW advised wife they may be able to take a FMLA to care for dad in her absence.  CSW acknowledged wife's commitment to caring for him and the difficulty she is now having due to her back- CSW also stressed to the wife the importance of taking care of herself and her needs so to not get in worse shape or have a fall/injury, etc that would jeopardize her health and ability to long term manage at home with caregiving and self care.  CSW Offered to call the adult children but she declines and will discuss further with them.  CSW offered to follow up after her appointment with back surgeon to determine where things are with planning.   Collaboration with [name], MD regarding development and update of comprehensive plan of care as evidenced by provider attestation and co-signature Inter-disciplinary care team collaboration (see longitudinal plan of  care) Assessment of needs, barriers , agencies contacted, as well as how impacting  Review various resources, discussed options and provided patient information about Limited social support, Transportation, ADL IADL limitations, and Limited access to caregiver Collaborated with appropriate clinical care team members regarding patient needs Limited social support, Transportation, Level of care concerns, and ADL IADL limitations,  s/p CVA  Patient interviewed and appropriate assessments performed Discussed plans with patient for ongoing care management follow up and provided patient with direct contact information for care management team Advised patient/wife to discuss options with family; adult children, extended family,etc Provided education to patient/caregiver regarding level of care options. Other interventions provided: Solution-Focused Strategies, Active listening / Reflection utilized , Problem Solving [place] , Caregiver stress acknowledged , and Consideration of in-home help encouraged  Patient Goals:  -discuss options with family (in-home support vs facility placement) -discuss options for respite stay at family owned care home - begin a  notebook of services in my neighborhood or community - call 211 when I need some help - follow-up on any referrals for help I am given - think ahead to make sure my need does not become an emergency - make a note about what I need to have by the phone or take with me, like an identification card or social security number have a back-up plan - have a back-up plan - make a list of family or friends that I can call  -  Follow Up Plan: Appointment scheduled for SW follow up with client by phone on: 07/19/21       Follow Up Plan: Appointment scheduled for SW follow up with client by phone on: 07/19/21      Eduard Clos MSW, [place] Licensed Clinical Social Worker [facility] [phone]

## 2021-07-06 ENCOUNTER — Telehealth: Payer: Medicare Other

## 2021-07-07 ENCOUNTER — Other Ambulatory Visit: Payer: Self-pay | Admitting: Family Medicine

## 2021-07-07 ENCOUNTER — Other Ambulatory Visit: Payer: Self-pay | Admitting: *Deleted

## 2021-07-07 MED ORDER — FUROSEMIDE 40 MG PO TABS: 40.0000 mg | ORAL_TABLET | Freq: Every day | ORAL | 2 refills | Status: DC

## 2021-07-07 MED ORDER — POLYSACCHARIDE IRON COMPLEX 150 MG PO CAPS: 150.0000 mg | ORAL_CAPSULE | Freq: Every day | ORAL | 2 refills | Status: DC

## 2021-07-13 ENCOUNTER — Other Ambulatory Visit: Payer: Self-pay | Admitting: *Deleted

## 2021-07-13 MED ORDER — METOPROLOL SUCCINATE ER 50 MG PO TB24: 50.0000 mg | ORAL_TABLET | Freq: Every day | ORAL | 2 refills | Status: DC

## 2021-07-16 ENCOUNTER — Ambulatory Visit (INDEPENDENT_AMBULATORY_CARE_PROVIDER_SITE_OTHER): Payer: Medicare Other | Admitting: Nurse Practitioner

## 2021-07-16 ENCOUNTER — Other Ambulatory Visit: Payer: Self-pay

## 2021-07-16 VITALS — BP 134/86 | HR 91

## 2021-07-16 DIAGNOSIS — R109 Unspecified abdominal pain: Secondary | ICD-10-CM | POA: Diagnosis not present

## 2021-07-16 DIAGNOSIS — U071 COVID-19: Secondary | ICD-10-CM | POA: Diagnosis not present

## 2021-07-16 DIAGNOSIS — K5903 Drug induced constipation: Secondary | ICD-10-CM

## 2021-07-16 DIAGNOSIS — G8929 Other chronic pain: Secondary | ICD-10-CM

## 2021-07-16 DIAGNOSIS — J029 Acute pharyngitis, unspecified: Secondary | ICD-10-CM

## 2021-07-16 DIAGNOSIS — R509 Fever, unspecified: Secondary | ICD-10-CM | POA: Diagnosis not present

## 2021-07-16 NOTE — Progress Notes (Signed)
Subjective:    Patient ID: [name], male    DOB: 08-12-1946, 75 y.o.   MRN: [id number]  HPI: [name] is a 75 y.o. male presenting with wife for constipation.  Chief Complaint  Patient presents with   urine problem    Not able to void having pain in the stomach on right side   Referral    Needing assistance with asst living, wife is having surgery soon, needs assistance with pt wihile in the hospital   CONSTIPATION He is currently taking amitiza 24 mcg twice daily.  Duration: years Usually has a bowel movement once to 2 times weekly.   Pain: no Severity: moderate Aggravating factors: Alleviating factors: Amitiza History of similar: yes Blood in stool: no Treatments attempted: enema Appetite: does not eat "as much as he should" per wife. She tries to give him 1 ensure daily.  Water: drinks about 6 cups per day, drinks coffee  UPPER RESPIRATORY TRACT INFECTION Onset: last weekend COVID-19 testing history: not tested COVID-19 vaccination status: vaccinated with 2 and booster Fever: no Muscle aches: yes Cough: yes Shortness of breath: no Wheezing: no Chest pain: no Chest tightness: no Chest congestion: yes Nasal congestion: yes Runny nose: yes Post nasal drip: yes Sneezing: no Sore throat: yes Swollen glands: no Sinus pressure: no Headache: no Face pain: no Toothache: no Ear pain: no  Ear pressure: no  Eyes red/itching:no Eye drainage/crusting: no  Nausea: no  Vomiting: no Diarrhea: no  Change in appetite: no  Loss of taste/smell: no  Rash: no Fatigue: yes Sick contacts: no Strep contacts: no  Context: stable Recurrent sinusitis: no Treatments attempted: Robitussin Relief with OTC medications: yes  No Known Allergies  Outpatient Encounter Medications as of 07/16/2021  Medication Sig   allopurinol (ZYLOPRIM) 300 MG tablet TAKE 1 TABLET(300 MG) BY MOUTH DAILY   amLODipine (NORVASC) 10 MG tablet Take 1 tablet (10 mg total) by mouth  daily.   cloNIDine (CATAPRES) 0.2 MG tablet Take 1 tablet (0.2 mg total) by mouth 3 (three) times daily. TAKE 1 TABLET(0.2 MG) BY MOUTH THREE TIMES DAILY   diclofenac sodium (VOLTAREN) 1 % GEL Apply twice a day for affected area on ankle as needed (Patient taking differently: Apply 4 g topically 2 (two) times daily as needed. Apply twice a day for affected area on ankle as needed)   fluticasone (FLONASE) 50 MCG/ACT nasal spray Place 2 sprays into both nostrils daily.   furosemide (LASIX) 40 MG tablet TAKE 1 TABLET(40 MG) BY MOUTH DAILY. MAY GIVE ADDITIONAL DOSE EVERY DAY AS NEEDED FOR INCREASE SWELLING   gabapentin (NEURONTIN) 300 MG capsule Take 2 capsules (600 mg total) by mouth 2 (two) times daily. May also take 2 capsules (600 mg total) daily as needed.   glucose blood (ONETOUCH VERIO) test strip Use as directed to check Blood Glucose twice daily   insulin glargine (LANTUS SOLOSTAR) 100 UNIT/ML Solostar Pen Inject 30 Units into the skin at bedtime.   iron polysaccharides (NIFEREX) 150 MG capsule TAKE 1 CAPSULE(150 MG) BY MOUTH DAILY   Lancets (ONETOUCH DELICA PLUS [id number]) MISC USE TO TEST BLOOD SUGAR FOUR TIMES DAILY (Patient taking differently: 1 Device by Other route in the morning, at noon, in the evening, and at bedtime.)   lisinopril (ZESTRIL) 20 MG tablet TAKE 1 TABLET(20 MG) BY MOUTH DAILY   lubiprostone (AMITIZA) 24 MCG capsule TAKE 1 CAPSULE(24 MCG) BY MOUTH TWICE DAILY WITH A MEAL   metoprolol succinate (TOPROL-XL) 50  MG 24 hr tablet Take 1 tablet (50 mg total) by mouth daily. Take with or immediately following a meal.   mupirocin cream (BACTROBAN) 2 % Apply 1 application topically 2 (two) times daily. Limit use for 2 weeks. Return to clinic with no improvement.   nystatin (MYCOSTATIN/NYSTOP) powder Apply topically 3 (three) times daily. (Patient taking differently: Apply topically as needed.)   oxyCODONE-acetaminophen (PERCOCET) 7.5-325 MG tablet Take 1 tablet by mouth every 4 (four)  hours as needed.   pantoprazole (PROTONIX) 40 MG tablet TAKE 1 TABLET(40 MG) BY MOUTH DAILY   simvastatin (ZOCOR) 20 MG tablet TAKE 1 TABLET(20 MG) BY MOUTH AT BEDTIME   tamsulosin (FLOMAX) 0.4 MG CAPS capsule TAKE 1 CAPSULE(0.4 MG) BY MOUTH DAILY   tiZANidine (ZANAFLEX) 2 MG tablet TAKE 1 TABLET(2 MG) BY MOUTH TWICE DAILY AS NEEDED   Triamcinolone Acetonide (TRIAMCINOLONE 0.1 % CREAM : EUCERIN) CREA Apply 1 application topically 2 (two) times daily as needed. (Patient taking differently: Apply 1 application topically 2 (two) times daily as needed for rash.)   No facility-administered encounter medications on file as of 07/16/2021.    Patient Active Problem List   Diagnosis Date Noted   MGUS (monoclonal gammopathy of unknown significance) 03/03/2021   Chronic obstructive pulmonary disease ([place]) 02/17/2021   Common variable immunodeficiency ([place]) 02/17/2021   Osteoarthrosis 02/17/2021   Diverticular disease 01/05/2021   History of cerebrovascular accident 01/05/2021   Hiatal hernia 01/05/2021   Hyperlipidemia 01/05/2021   Glaucoma 01/05/2021   Esotropia of left eye 01/05/2021   Benign prostatic hyperplasia 01/05/2021   History of radiation therapy 01/05/2021   Anemia 01/05/2021   GI bleed 03/18/2020   Acute lower GI bleeding 03/03/2020   Pressure injury of skin 03/03/2020   Thoracic aortic aneurysm without rupture ([place]) 11/19/2019   Hardening of the aorta (main artery of the heart) ([place]) 11/19/2019   Protein-calorie malnutrition ([place]) 06/10/2019   Avitaminosis D 03/07/2019   Male hypogonadism 07/26/2018   Gynecomastia 06/27/2018   Malignant neoplasm of upper lobe of left lung ([place]) 04/06/2018   History of malignant neoplasm of parotid gland 02/07/2018   Adenoma of large intestine 11/09/2016   Adult hypothyroidism 09/09/2015   Chronic constipation 05/22/2015   Impetigo bullosa 10/23/2013   Chronic left shoulder pain 09/25/2013   Muscle weakness (generalized) 09/25/2013    Hypercalcemia 08/08/2013   Chronic kidney disease, stage 3 unspecified ([place]) 08/07/2013   AKI (acute kidney injury) ([place]) 07/24/2013   Acute kidney failure, unspecified ([place]) 07/24/2013   Adhesive capsulitis of left shoulder 05/14/2013   GERD (gastroesophageal reflux disease)    Carcinoma of parotid gland ([place]) 11/23/2012   Epidermoid cyst 10/05/2012   Neck pain 08/26/2012   Chronic abdominal pain 08/26/2012   Edema of lower extremity 08/24/2012   Hemiplegia of dominant side as late effect of cerebrovascular disease ([place]) 06/06/2012   Diarrhea 04/17/2012   Alimentary obesity 04/17/2012   Type 2 diabetes mellitus with diabetic neuropathy, unspecified ([place]) 02/13/2012   Recurrent boils 01/12/2012   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 ([place]) 07/15/2011   Hemorrhoids, internal 03/23/2011   Hepatitis 03/02/2011   Steatohepatitis 03/02/2011   Abdominal pain, right upper quadrant 12/16/2010   Abnormal levels of other serum enzymes 12/16/2010   Eosinophil count raised 12/23/2009   HEMATOCHEZIA 12/23/2009   Type 2 diabetes mellitus ([place]) 01/05/2009   Gout 01/05/2009   Hypertensive disorder 01/05/2009   BARRETTS ESOPHAGUS 01/05/2009   DIVERTICULOSIS OF COLON 01/05/2009   History of  malignant lymphoma 01/05/2009    Past Medical History:  Diagnosis Date   Arthritis    Asthma    "hx of"   Barrett's esophagus    EGD 03/23/2011 & EGD 2/09 bx proven   BPH (benign prostatic hyperplasia)    Cancer of parotid gland ([place]) 11/23/12   Adenocarcinoma   Chronic abdominal pain    Chronic constipation    Colon polyp 03/23/2011   tubular adenoma, Dr. Gala Romney   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 ([place]) 07/15/2011   CVA (cerebral infarction) 1998   right sided deficit   Delayed gastric emptying 2018   Diverticulosis    TCS 03/23/11 pancolonic diverticula &TCS 5/08, pancolonic diverticula   DM (diabetes mellitus) ([place])    Edema of lower extremity 12/21/12   bilateral     Esotropia of left eye    GERD (gastroesophageal reflux disease)    Glaucoma (increased eye pressure)    Gout    Gout    Hemorrhagic colitis 06/06/2012.   Hemorrhoids, internal 03/23/2011   tcs by Dr. Gala Romney   Hepatitis    esosiniphilic, tx with prednisone   Hiatal hernia    History of radiation therapy 05/21/18- 05/30/18   Left Lung/ 54 Gy delivered in 3 fractions of 18 Gy. SBRT   HTN (hypertension)    Hx of radiation therapy 1974   right base of skull area-lymphoma   Hyperlipidemia    Lower facial weakness    Right   Lymphoma ([place]) 1974   XRT at [facility], right base of skull area   Neuropathy    Non-small cell lung cancer ([place]) dx'd 04/26/18   Peripheral edema    R>L legs   Rash    chronic, recurrent, R>L legs   Renal insufficiency    Steatohepatitis    liver biopsy 2009   Stroke [facility]) 1998   right hemiparesis/plegia    Relevant past medical, surgical, family and social history reviewed and updated as indicated. Interim medical history since our last visit reviewed.  Review of Systems Per HPI unless specifically indicated above     Objective:    BP 134/86   Pulse 91   SpO2 97%   Wt Readings from Last 3 Encounters:  03/15/21 225 lb (102.1 kg)  03/05/21 225 lb (102.1 kg)  08/14/20 250 lb (113.4 kg)    Physical Exam Vitals and nursing note reviewed.  Constitutional:      General: He is not in acute distress.    Appearance: Normal appearance. He is not toxic-appearing.  HENT:     Head: Normocephalic and atraumatic.     Right Ear: Tympanic membrane, ear [name] and external ear normal.     Left Ear: Tympanic membrane, ear [name] and external ear normal.     Nose: Congestion present. No rhinorrhea.     Mouth/Throat:     Mouth: Mucous membranes are moist.     Pharynx: Oropharynx is clear. Posterior oropharyngeal erythema present.  Pulmonary:     Effort: Pulmonary effort is normal. No respiratory distress.     Breath sounds: Normal breath sounds. No wheezing, rhonchi or  rales.  Abdominal:     General: Abdomen is flat. Bowel sounds are normal. There is no distension.     Palpations: Abdomen is soft. There is no mass.     Tenderness: There is no abdominal tenderness.  Skin:    General: Skin is warm and dry.     Coloration: Skin is not jaundiced or  pale.     Findings: No erythema.  Neurological:     Mental Status: He is alert. Mental status is at baseline.     Motor: Weakness (patient is sitting in wheelchair favoring right side - baseline) present.     Gait: Gait abnormal.      Assessment & Plan:  1. Sore throat Acute, ongoing.  Suspect viral URI given length of symptoms.  Viral testing obtained.  Reassured patient that symptoms and exam findings are most consistent with a viral upper respiratory infection and explained lack of efficacy of antibiotics against viruses.  Discussed expected course and features suggestive of secondary bacterial infection.  Continue supportive care. Increase fluid intake with water or electrolyte solution like pedialyte. Encouraged acetaminophen as needed for fever/pain. Encouraged salt water gargling, chloraseptic spray and throat lozenges. Encouraged OTC guaifenesin. Encouraged saline sinus flushes and/or neti with humidified air.  If symptoms are not improved by Monday, notify clinic so we can get a chest x-ray/start antibiotics.   - SARS-CoV-2 RNA (COVID-19) and Respiratory Viral Panel, Qualitative NAAT  2. Chronic abdominal pain Patient appears to have chronic abdominal pain and constipation associated with this.  He also takes Percocet 7.5-325 multiple times daily which is likely attributing to his constipation alongside not being mobile secondary to history of stroke.  Encouraged starting twice daily Miralax to help with constipation.  Wife is primary caregiver and is having back surgery soon.  She has requested help with the patient while she is in the hospital.  SW has contacted her to discuss options; it sounds like  assistance is not covered by their insurance and they would need to pay out of pocket for assisted living.    3. Drug-induced constipation      Follow up plan: Return for needs follow up with Dr. Dennard Schaumann.

## 2021-07-17 ENCOUNTER — Encounter: Payer: Self-pay | Admitting: Nurse Practitioner

## 2021-07-18 ENCOUNTER — Other Ambulatory Visit: Payer: Self-pay | Admitting: *Deleted

## 2021-07-18 DIAGNOSIS — N1831 Chronic kidney disease, stage 3a: Secondary | ICD-10-CM

## 2021-07-19 ENCOUNTER — Ambulatory Visit (INDEPENDENT_AMBULATORY_CARE_PROVIDER_SITE_OTHER): Payer: Medicare Other | Admitting: *Deleted

## 2021-07-19 DIAGNOSIS — N1831 Chronic kidney disease, stage 3a: Secondary | ICD-10-CM

## 2021-07-19 DIAGNOSIS — I10 Essential (primary) hypertension: Secondary | ICD-10-CM

## 2021-07-19 DIAGNOSIS — E1122 Type 2 diabetes mellitus with diabetic chronic kidney disease: Secondary | ICD-10-CM

## 2021-07-19 DIAGNOSIS — K2270 Barrett's esophagus without dysplasia: Secondary | ICD-10-CM

## 2021-07-19 DIAGNOSIS — Z8673 Personal history of transient ischemic attack (TIA), and cerebral infarction without residual deficits: Secondary | ICD-10-CM

## 2021-07-19 NOTE — Patient Instructions (Signed)
Visit Information  PATIENT GOALS:  Goals Addressed             This Visit's Progress    COMPLETED: Find Help in My Community.   On track    Timeframe:  Long-Range Goal Priority:  High Start Date:     07/05/2021                        Expected End Date:    07/19/2021                    Follow-Up Date:  No Follow-Up Required, Per Wife.    Patient Goals/Self-Care Activities: Wife, Terald Jump has been able to confirm a 3-5 month stay for patient at [facility] in [place], while she undergoes back surgery and throughout her recovery process. Wife, Stanford Strauch will contact her orthopaedic spine surgeon today to schedule her back surgery. Wife, Alin Chavira will contact Eduard Clos, LCSW directly if she has questions/concerns, or if additional social work needs are identified in the near future.           Patient verbalizes understanding of instructions provided today and agrees to view in [place].   No Follow-Up Required, Per Wife.  Nat Christen, BSW, MSW, [employer]  Licensed Holiday representative  [employer]  Mailing Address-[address], [place], [place] [address] Physical Address-[address] [place], [place] [address] Toll Free Main # [phone] Fax # [phone] Cell # [phone]  [email or web address]

## 2021-07-19 NOTE — Chronic Care Management (AMB) (Signed)
Chronic Care Management    Clinical Social Work Note  07/19/2021 Name: [name] MRN: [id number] DOB: [birth year]  [name] is a 75 y.o. year old male who is a primary care patient of [name], Jason Mcgee, MD. The CCM team was consulted to assist the patient with chronic disease management and/or care coordination needs related to: Level of Care Concerns.   Engaged with patient' wife by telephone for follow-up visit in response to provider referral for social work chronic care management and care coordination services.   Consent to Services:  The patient was given information about Chronic Care Management services, agreed to services, and gave verbal consent prior to initiation of services.  Please see initial visit note for detailed documentation.   Patient agreed to services and consent obtained.   Assessment: Review of patient past medical history, allergies, medications, and health status, including review of relevant consultants reports was performed today as part of a comprehensive evaluation and provision of chronic care management and care coordination services.     SDOH (Social Determinants of Health) assessments and interventions performed:    Advanced Directives Status: Not addressed in this encounter.  CCM Care Plan  No Known Allergies  Outpatient Encounter Medications as of 07/19/2021  Medication Sig   allopurinol (ZYLOPRIM) 300 MG tablet TAKE 1 TABLET(300 MG) BY MOUTH DAILY   amLODipine (NORVASC) 10 MG tablet Take 1 tablet (10 mg total) by mouth daily.   cloNIDine (CATAPRES) 0.2 MG tablet Take 1 tablet (0.2 mg total) by mouth 3 (three) times daily. TAKE 1 TABLET(0.2 MG) BY MOUTH THREE TIMES DAILY   diclofenac sodium (VOLTAREN) 1 % GEL Apply twice a day for affected area on ankle as needed (Patient taking differently: Apply 4 g topically 2 (two) times daily as needed. Apply twice a day for affected area on ankle as needed)   fluticasone (FLONASE) 50 MCG/ACT nasal spray  Place 2 sprays into both nostrils daily.   furosemide (LASIX) 40 MG tablet TAKE 1 TABLET(40 MG) BY MOUTH DAILY. MAY GIVE ADDITIONAL DOSE EVERY DAY AS NEEDED FOR INCREASE SWELLING   gabapentin (NEURONTIN) 300 MG capsule Take 2 capsules (600 mg total) by mouth 2 (two) times daily. May also take 2 capsules (600 mg total) daily as needed.   glucose blood (ONETOUCH VERIO) test strip Use as directed to check Blood Glucose twice daily   insulin glargine (LANTUS SOLOSTAR) 100 UNIT/ML Solostar Pen Inject 30 Units into the skin at bedtime.   iron polysaccharides (NIFEREX) 150 MG capsule TAKE 1 CAPSULE(150 MG) BY MOUTH DAILY   Lancets (ONETOUCH DELICA PLUS DSKAJG81L) MISC USE TO TEST BLOOD SUGAR FOUR TIMES DAILY (Patient taking differently: 1 Device by Other route in the morning, at noon, in the evening, and at bedtime.)   lisinopril (ZESTRIL) 20 MG tablet TAKE 1 TABLET(20 MG) BY MOUTH DAILY   lubiprostone (AMITIZA) 24 MCG capsule TAKE 1 CAPSULE(24 MCG) BY MOUTH TWICE DAILY WITH A MEAL   metoprolol succinate (TOPROL-XL) 50 MG 24 hr tablet Take 1 tablet (50 mg total) by mouth daily. Take with or immediately following a meal.   mupirocin cream (BACTROBAN) 2 % Apply 1 application topically 2 (two) times daily. Limit use for 2 weeks. Return to clinic with no improvement.   nystatin (MYCOSTATIN/NYSTOP) powder Apply topically 3 (three) times daily. (Patient taking differently: Apply topically as needed.)   oxyCODONE-acetaminophen (PERCOCET) 7.5-325 MG tablet Take 1 tablet by mouth every 4 (four) hours as needed.   pantoprazole (PROTONIX) 40  MG tablet TAKE 1 TABLET(40 MG) BY MOUTH DAILY   simvastatin (ZOCOR) 20 MG tablet TAKE 1 TABLET(20 MG) BY MOUTH AT BEDTIME   tamsulosin (FLOMAX) 0.4 MG CAPS capsule TAKE 1 CAPSULE(0.4 MG) BY MOUTH DAILY   tiZANidine (ZANAFLEX) 2 MG tablet TAKE 1 TABLET(2 MG) BY MOUTH TWICE DAILY AS NEEDED   Triamcinolone Acetonide (TRIAMCINOLONE 0.1 % CREAM : EUCERIN) CREA Apply 1 application  topically 2 (two) times daily as needed. (Patient taking differently: Apply 1 application topically 2 (two) times daily as needed for rash.)   No facility-administered encounter medications on file as of 07/19/2021.    Patient Active Problem List   Diagnosis Date Noted   MGUS (monoclonal gammopathy of unknown significance) 03/03/2021   Chronic obstructive pulmonary disease ([place]) 02/17/2021   Common variable immunodeficiency ([place]) 02/17/2021   Osteoarthrosis 02/17/2021   Diverticular disease 01/05/2021   History of cerebrovascular accident 01/05/2021   Hiatal hernia 01/05/2021   Hyperlipidemia 01/05/2021   Glaucoma 01/05/2021   Esotropia of left eye 01/05/2021   Benign prostatic hyperplasia 01/05/2021   History of radiation therapy 01/05/2021   Anemia 01/05/2021   GI bleed 03/18/2020   Acute lower GI bleeding 03/03/2020   Pressure injury of skin 03/03/2020   Thoracic aortic aneurysm without rupture ([place]) 11/19/2019   Hardening of the aorta (main artery of the heart) ([place]) 11/19/2019   Protein-calorie malnutrition ([place]) 06/10/2019   Avitaminosis D 03/07/2019   Male hypogonadism 07/26/2018   Gynecomastia 06/27/2018   Malignant neoplasm of upper lobe of left lung ([place]) 04/06/2018   History of malignant neoplasm of parotid gland 02/07/2018   Adenoma of large intestine 11/09/2016   Adult hypothyroidism 09/09/2015   Chronic constipation 05/22/2015   Impetigo bullosa 10/23/2013   Chronic left shoulder pain 09/25/2013   Muscle weakness (generalized) 09/25/2013   Hypercalcemia 08/08/2013   Chronic kidney disease, stage 3 unspecified ([place]) 08/07/2013   AKI (acute kidney injury) ([place]) 07/24/2013   Acute kidney failure, unspecified ([place]) 07/24/2013   Adhesive capsulitis of left shoulder 05/14/2013   GERD (gastroesophageal reflux disease)    Carcinoma of parotid gland ([place]) 11/23/2012   Epidermoid cyst 10/05/2012   Neck pain 08/26/2012   Chronic abdominal pain 08/26/2012   Edema of  lower extremity 08/24/2012   Hemiplegia of dominant side as late effect of cerebrovascular disease ([place]) 06/06/2012   Diarrhea 04/17/2012   Alimentary obesity 04/17/2012   Type 2 diabetes mellitus with diabetic neuropathy, unspecified ([place]) 02/13/2012   Recurrent boils 01/12/2012   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 ([place]) 07/15/2011   Hemorrhoids, internal 03/23/2011   Hepatitis 03/02/2011   Steatohepatitis 03/02/2011   Abdominal pain, right upper quadrant 12/16/2010   Abnormal levels of other serum enzymes 12/16/2010   Eosinophil count raised 12/23/2009   HEMATOCHEZIA 12/23/2009   Type 2 diabetes mellitus ([place]) 01/05/2009   Gout 01/05/2009   Hypertensive disorder 01/05/2009   BARRETTS ESOPHAGUS 01/05/2009   DIVERTICULOSIS OF COLON 01/05/2009   History of malignant lymphoma 01/05/2009    Conditions to be addressed/monitored: HTN and DMII.  Level of Care Concerns, ADL/IADL Limitations, and Limited Access to Caregiver.  Care Plan : LCSW Plan of Care  Updates made by Francis Gaines, LCSW since 07/19/2021 12:00 AM     Problem: Social and Functional Symptoms Resolved 07/19/2021  Priority: High     Long-Range Goal: Provide knowledge and resources to aide in patient care (stroke patient and caregiver needs) Completed 07/19/2021  Start Date: 07/05/2021  Expected  End Date: 09/10/2021  This Visit's Progress: On track  Recent Progress: On track  Priority: High  Note:   Current Barriers:   Patient in need of assistance with connecting to community resources for Limited social support, Transportation, Level of care concerns, ADL IADL limitations, Limited access to caregiver, and Lacks knowledge of community resources Acknowledges deficits with meeting this unmet need Patient is unable to independently navigate community resource options without care coordination support Clinical Goals:  patient will work with SW to address concerns related to level of care  needs/options Clinical Interventions:  CSW assessed pt for needs. Per wife, she is in need of back surgery and has been putting it off because of pt's needs- pt is s/p CVA 1998 and is primarily wheelchair level of care at home.  Pt's wife wants to consider options for his care while she is gone for back surgery and during her recovery. Pt does not have [employer], [place] or Veteran's benefits. CSW discussed with wife that pt would be at a custodial level of care and thus would need to be private pay if placed in a facility (Assisted Living level probably). CSW had lengthy discussion with wife about levels of care, payor/coverage, and private pay options; whether it be at home or in facility. Wife is aware of the significant expense each could be given insurance does not cover.   Pt's wife shared that her step-son and his mother have a "family care home" in [place] and [place] suggested she look into this option if comfortable.  CSW also encouraged wife to discuss with the son and daughter to see if they are able to assist; both work; however, CSW advised wife they may be able to take a FMLA to care for dad in her absence.  CSW acknowledged wife's commitment to caring for him and the difficulty she is now having due to her back- CSW also stressed to the wife the importance of taking care of herself and her needs so to not get in worse shape or have a fall/injury, etc that would jeopardize her health and ability to long term manage at home with care giving and self care.  CSW Offered to call the adult children but she declines and will discuss further with them.  CSW offered to follow up after her appointment with back surgeon to determine where things are with planning.   Collaboration with [name], MD regarding development and update of comprehensive plan of care as evidenced by provider attestation and co-signature Inter-disciplinary care team collaboration (see longitudinal plan of  care) Assessment of needs, barriers , agencies contacted, as well as how impacting  Review various resources, discussed options and provided patient information about Limited social support, Transportation, ADL IADL limitations, and Limited access to caregiver Collaborated with appropriate clinical care team members regarding patient needs Limited social support, Transportation, Level of care concerns, and ADL IADL limitations,  s/p CVA  Patient interviewed and appropriate assessments performed Discussed plans with patient for ongoing care management follow up and provided patient with direct contact information for care management team Advised patient/wife to discuss options with family; adult children, extended family,etc Provided education to patient/caregiver regarding level of care options. Other interventions provided: Solution-Focused Strategies, Active listening / Reflection utilized , Problem Solving [facility] , Caregiver stress acknowledged , and Consideration of in-home help encouraged  Patient Goals/Self-Care Activities: Wife, Jayjay Littles has been able to confirm a 3-5 month stay for patient at [facility] in  [place], while she undergoes back surgery and throughout her recovery process. Wife, Tanyon Alipio will contact her orthopaedic spine surgeon today to schedule her back surgery. Wife, Essam Lowdermilk will contact Eduard Clos, LCSW directly if she has questions/concerns, or if additional social work needs are identified in the near future. Follow-Up Plan:  No Follow-Up Required, Per Wife.      Follow-Up Plan: No Follow-Up Required, Per Wife.      Nat Christen, BSW, MSW, [employer]  Licensed Holiday representative  [employer]  Mailing Address-[address], [place], [place] [address] Physical Address-[address], [place], [place] [address] Toll Free Main # [phone] Fax # [phone] Cell # [phone]  [email or web address]

## 2021-07-20 LAB — SARS-COV-2 RNA (COVID-19) RESP VIRAL PNL QL NAAT

## 2021-07-21 MED ORDER — MOLNUPIRAVIR EUA 200MG CAPSULE: 4.0000 | ORAL_CAPSULE | Freq: Two times a day (BID) | ORAL | 0 refills | Status: AC

## 2021-07-21 NOTE — Addendum Note (Signed)
Addended by: Noemi Chapel A on: 07/21/2021 01:07 PM   Modules accepted: Orders

## 2021-07-22 ENCOUNTER — Ambulatory Visit: Payer: Medicare Other | Admitting: Pharmacist

## 2021-07-22 DIAGNOSIS — E1122 Type 2 diabetes mellitus with diabetic chronic kidney disease: Secondary | ICD-10-CM

## 2021-07-22 DIAGNOSIS — I10 Essential (primary) hypertension: Secondary | ICD-10-CM

## 2021-07-22 DIAGNOSIS — Z794 Long term (current) use of insulin: Secondary | ICD-10-CM

## 2021-07-22 DIAGNOSIS — N1831 Chronic kidney disease, stage 3a: Secondary | ICD-10-CM | POA: Diagnosis not present

## 2021-07-22 DIAGNOSIS — N182 Chronic kidney disease, stage 2 (mild): Secondary | ICD-10-CM | POA: Diagnosis not present

## 2021-07-22 NOTE — Patient Instructions (Addendum)
Visit Information   Goals Addressed             This Visit's Progress    Monitor and Manage My Blood Sugar-Diabetes Type 2   On track    Timeframe:  Long-Range Goal Priority:  High Start Date:     03/04/21                        Expected End Date:        09/04/21               Follow Up Date 06/10/21   - check blood sugar at prescribed times - check blood sugar if I feel it is too high or too low - enter blood sugar readings and medication or insulin into daily log    Why is this important?   Checking your blood sugar at home helps to keep it from getting very high or very low.  Writing the results in a diary or log helps the doctor know how to care for you.  Your blood sugar log should have the time, date and the results.  Also, write down the amount of insulin or other medicine that you take.  Other information, like what you ate, exercise done and how you were feeling, will also be helpful.     Notes:        Patient Care Plan: General Pharmacy (Adult)     Problem Identified: hypertension, GERD/barretts Esophagus, Type II DM, hypothyroidism, CKD, hyperlipidemia, CVA.   Priority: High  Onset Date: 03/04/2021     Long-Range Goal: Patient-Specific Goal   Start Date: 03/04/2021  Expected End Date: 09/04/2021  Recent Progress: On track  Priority: High  Note:   Current Barriers:  No specific barriers at this time  Pharmacist Clinical Goal(s):  Patient will achieve adherence to monitoring guidelines and medication adherence to achieve therapeutic efficacy maintain control of blood pressure and blood sugar as evidenced by home monitoring  contact provider office for questions/concerns as evidenced notation of same in electronic health record through collaboration with PharmD and provider.   Interventions: 1:1 collaboration with [name], MD regarding development and update of comprehensive plan of care as evidenced by provider attestation and  co-signature Inter-disciplinary care team collaboration (see longitudinal plan of care) Comprehensive medication review performed; medication list updated in electronic medical record  Hypertension (BP goal <140/90) -Controlled -Current treatment: Amlodipine 6m daily Clonidine 0.220mTID Lisinopril 2057maily Metoprolol XL 22m68mily -Medications previously tried: none noted -Current home readings: 98/79 P70  -  141/100P 91 -Current dietary habits: up and down   -Denies hypotensive/hypertensive symptoms -Educated on BP goals and benefits of medications for prevention of heart attack, stroke and kidney damage; Importance of home blood pressure monitoring; Symptoms of hypotension and importance of maintaining adequate hydration; -Counseled to monitor BP at home daily, document, and provide log at future appointments -Recommended to continue current medication Recommended Contact providers if BP remains elevated above 140/90 consistently.  Update 07/22/21 Wife states BP has been in the "normal range" Denies any dizziness or HA's Encouraged to continue to monitor at home and document  No changes to meds at this time   Diabetes (A1c goal <7%) -Controlled -Current medications: Lantus 100u/ml 30 units at bedtime -Medications previously tried: none noted -Current home glucose readings fasting glucose: 75-85 post prandial glucose: 160 was the highest it has been -Denies hypoglycemic/hyperglycemic symptoms -Current meal patterns:  Some days patient is  not eating as much as others -Current exercise: minimal -Educated on A1c and blood sugar goals; Prevention and management of hypoglycemic episodes; Benefits of routine self-monitoring of blood sugar; Confirmed patient taking newly reduced dose of Lantus (36 units) -Counseled to check feet daily and get yearly eye exams -Recommended to continue current medication Some concern for hypoglycemia with patients lack of food intake as  well as long acting insulin.  Recently reduced dose of Lantus to 36 units, however patient reports some blood sugars in the low 70s to 80s.  Noting age would consider further decrease of insulin to see fasting sugars > 90.  Will consult with PCP on proper dosing moving forward.  Update 07/22/21 Last visit decreased Lantus to 30 units hs Fasting Sugars - 90s Post Prandial - 150-160s Denies any recent hypoglycemia He is moving to a nursing home while his wife has surgery and recovers.  Encouraged them to reach out if they need help with meds during this transition process.  Continue current medications for now.  Patient Goals/Self-Care Activities Patient will:  - take medications as prescribed check glucose daily, document, and provide at future appointments check blood pressure daily, document, and provide at future appointments notify providers of any hypoglycemia.  Follow Up Plan: The care management team will reach out to the patient again over the next 120 days.          Patient verbalizes understanding of instructions provided today and agrees to view in [place].  Telephone follow up appointment with pharmacy team member scheduled for: 6 months  Edythe Clarity, [place]

## 2021-07-22 NOTE — Progress Notes (Signed)
Chronic Care Management Pharmacy Note  07/22/2021 Name:  [name] MRN:  [id number] DOB:  1946-11-10  Subjective: [name] is an 75 y.o. year old male who is a primary patient of Pickard, Cammie Mcgee, MD.  The CCM team was consulted for assistance with disease management and care coordination needs.    Engaged with patient by telephone for follow up visit in response to provider referral for pharmacy case management and/or care coordination services.   Consent to Services:  The patient was given the following information about Chronic Care Management services today, agreed to services, and gave verbal consent: 1. CCM service includes personalized support from designated clinical staff supervised by the primary care provider, including individualized plan of care and coordination with other care providers 2. 24/7 contact phone numbers for assistance for urgent and routine care needs. 3. Service will only be billed when office clinical staff spend 20 minutes or more in a month to coordinate care. 4. Only one practitioner may furnish and bill the service in a calendar month. 5.The patient may stop CCM services at any time (effective at the end of the month) by phone call to the office staff. 6. The patient will be responsible for cost sharing (co-pay) of up to 20% of the service fee (after annual deductible is met). Patient agreed to services and consent obtained.  Patient Care Team: [name], MD as PCP - General (Family Medicine) Gala Romney Cristopher Estimable, MD (Gastroenterology) Edythe Clarity, [facility] as Pharmacist (Pharmacist) Carson Myrtle, Maree Erie, LCSW as Social Worker (Licensed Clinical Social Worker)  Recent office visits: 07/16/21 [facility]) - viral sore throat, encouraged supportive care with OTC meds  01/26/21 [facility]) - no medication changes.  Consider bedside commode to prevent falls  Recent consult visits: 03/03/21 ([place], Oncology) - referred for bloodwork.   Investigative labs drawn.  12/24/19 Ortho No need for the sling patients proximal humerus fracture has healed. No further treatment needed.  No medication changes.  12/22/19 Endo Rayetta Pigg, NP (Video call), DECREASED Insulin Glargine to 36 units subcutaneous daily at bedtime.  11/25/20 Ortho Dr. Alden Hipp a sling to wear to treat his injury check up in 3-4 weeks. No medication changes.   Hospital visits: None in previous 6 months  Objective:  Lab Results  Component Value Date   CREATININE 1.75 (H) 03/15/2021   BUN 25 (H) 03/15/2021   GFRNONAA 40 (L) 03/15/2021   GFRAA 37 (L) 12/16/2020   NA 139 03/15/2021   K 3.8 03/15/2021   CALCIUM 9.8 03/15/2021   CO2 25 03/15/2021   GLUCOSE 99 03/15/2021    Lab Results  Component Value Date/Time   HGBA1C 6.9 (A) 04/20/2021 10:38 AM   HGBA1C 6.9 (H) 12/16/2020 09:18 AM   HGBA1C 6.2 (H) 09/02/2020 10:19 AM   HGBA1C 6.7 12/03/2015 12:00 AM   MICROALBUR 123.0 05/12/2020 09:48 AM   MICROALBUR 232.3 05/04/2017 09:31 AM    Last diabetic Eye exam:  Lab Results  Component Value Date/Time   HMDIABEYEEXA No Retinopathy 10/16/2018 12:00 AM    Last diabetic Foot exam: No results found for: HMDIABFOOTEX   Lab Results  Component Value Date   CHOL 138 05/12/2020   HDL 50 05/12/2020   LDLCALC 64 05/12/2020   TRIG 153 (H) 05/12/2020   CHOLHDL 2.8 05/12/2020    Hepatic Function Latest Ref Rng & Units 03/03/2021 12/16/2020 09/02/2020  Total Protein 6.5 - 8.1 g/dL 7.7 6.8 6.7  Albumin 3.5 - 5.0 g/dL 3.2(L) 3.7 -  AST 15 - 41 U/L 9(L) 13 10  ALT 0 - 44 U/L 10 10 6(L)  Alk Phosphatase 38 - 126 U/L 115 152(H) -  Total Bilirubin 0.3 - 1.2 mg/dL <0.2(L) 0.2 0.4  Bilirubin, Direct <=0.2 mg/dL - - -    Lab Results  Component Value Date/Time   TSH 2.920 12/16/2020 09:18 AM   TSH 2.68 05/12/2020 09:48 AM   FREET4 1.27 12/16/2020 09:18 AM   FREET4 1.3 05/12/2020 09:48 AM    CBC Latest Ref Rng & Units 03/15/2021 03/03/2021 09/02/2020  WBC 4.0  - 10.5 K/uL 10.7(H) 9.0 8.5  Hemoglobin 13.0 - 17.0 g/dL 12.3(L) 11.4(L) 12.0(L)  Hematocrit 39.0 - 52.0 % 41.2 37.0(L) 40.5  Platelets 150 - 400 K/uL 285 278 362    Lab Results  Component Value Date/Time   VD25OH 59 05/12/2020 09:48 AM   VD25OH 67 10/08/2019 08:29 AM    Clinical ASCVD: Yes  The 10-year ASCVD risk score Mikey Bussing DC Jr., et al., 2013) is: 36.2%   Values used to calculate the score:     Age: 57 years     Sex: Male     Is Non-Hispanic African American: Yes     Diabetic: Yes     Tobacco smoker: No     Systolic Blood Pressure: 381 mmHg     Is BP treated: Yes     HDL Cholesterol: 50 mg/dL     Total Cholesterol: 138 mg/dL    Depression screen [facility] 2/9 02/17/2021 01/26/2021 11/05/2018  Decreased Interest 0 0 0  Down, Depressed, Hopeless 0 0 0  PHQ - 2 Score 0 0 0  Some recent data might be hidden       Social History   Tobacco Use  Smoking Status Former   Packs/day: 3.00   Years: 25.00   Pack years: 75.00   Types: Cigarettes   Quit date: 03/01/1997   Years since quitting: 24.4  Smokeless Tobacco Never   BP Readings from Last 3 Encounters:  07/16/21 134/86  04/20/21 106/75  03/30/21 (!) 142/88   Pulse Readings from Last 3 Encounters:  07/16/21 91  04/20/21 64  03/30/21 69   Wt Readings from Last 3 Encounters:  03/15/21 225 lb (102.1 kg)  03/05/21 225 lb (102.1 kg)  08/14/20 250 lb (113.4 kg)   BMI Readings from Last 3 Encounters:  03/30/21 29.69 kg/m  03/15/21 29.69 kg/m  03/05/21 29.69 kg/m    Assessment/Interventions: Review of patient past medical history, allergies, medications, health status, including review of consultants reports, laboratory and other test data, was performed as part of comprehensive evaluation and provision of chronic care management services.   SDOH:  (Social Determinants of Health) assessments and interventions performed: Yes   Financial Resource Strain: Not on file      CCM Care Plan  No Known  Allergies  Medications Reviewed Today     Reviewed by Edythe Clarity, [facility] (Pharmacist) on 07/22/21 at [place] List Status: <None>   Medication Order Taking? Sig Documenting Provider Last Dose Status Informant  allopurinol (ZYLOPRIM) 300 MG tablet [id number] Yes TAKE 1 TABLET(300 MG) BY MOUTH DAILY [name], MD Taking Active   amLODipine (NORVASC) 10 MG tablet [id number] Yes Take 1 tablet (10 mg total) by mouth daily. [name], MD Taking Active   cloNIDine (CATAPRES) 0.2 MG tablet [id number] Yes Take 1 tablet (0.2 mg total) by mouth 3 (three) times daily. TAKE 1 TABLET(0.2 MG) BY MOUTH THREE TIMES DAILY  [name], MD Taking Active   diclofenac sodium (VOLTAREN) 1 % GEL [id number] Yes Apply twice a day for affected area on ankle as needed  Patient taking differently: Apply 4 g topically 2 (two) times daily as needed. Apply twice a day for affected area on ankle as needed   Nibbe, [name], MD Taking Active Spouse/Significant Other  fluticasone (FLONASE) 50 MCG/ACT nasal spray [id number] Yes Place 2 sprays into both nostrils daily. [place], [name], MD Taking Active   furosemide (LASIX) 40 MG tablet [id number] Yes TAKE 1 TABLET(40 MG) BY MOUTH DAILY. MAY GIVE ADDITIONAL DOSE EVERY DAY AS NEEDED FOR INCREASE SWELLING Pickard, Cammie Mcgee, MD Taking Active   gabapentin (NEURONTIN) 300 MG capsule [id number] Yes Take 2 capsules (600 mg total) by mouth 2 (two) times daily. May also take 2 capsules (600 mg total) daily as needed. [name], MD Taking Active   glucose blood [facility] VERIO) test strip [id number] Yes Use as directed to check Blood Glucose twice daily Brita Romp, NP Taking Active   insulin glargine (LANTUS SOLOSTAR) 100 UNIT/ML Solostar Pen [id number] Yes Inject 30 Units into the skin at bedtime. Brita Romp, NP Taking Active   iron polysaccharides (NIFEREX) 150 MG capsule [id number] Yes TAKE 1 CAPSULE(150 MG) BY MOUTH DAILY [name], MD  Taking Active   Lancets (ONETOUCH DELICA PLUS [id number]) [place] [id number] Yes USE TO TEST BLOOD SUGAR FOUR TIMES DAILY  Patient taking differently: 1 Device by Other route in the morning, at noon, in the evening, and at bedtime.   Cassandria Anger, MD Taking Active   lisinopril (ZESTRIL) 20 MG tablet [id number] Yes TAKE 1 TABLET(20 MG) BY MOUTH DAILY [name], MD Taking Active   lubiprostone (AMITIZA) 24 MCG capsule [id number] Yes TAKE 1 CAPSULE(24 MCG) BY MOUTH TWICE DAILY WITH A MEAL Pickard, Cammie Mcgee, MD Taking Active   metoprolol succinate (TOPROL-XL) 50 MG 24 hr tablet [id number] Yes Take 1 tablet (50 mg total) by mouth daily. Take with or immediately following a meal. [name], MD Taking Active   molnupiravir EUA 200 mg CAPS [id number] Yes Take 4 capsules (800 mg total) by mouth every 12 (twelve) hours for 5 days. Eulogio Bear, NP Taking Active   mupirocin cream (BACTROBAN) 2 % [id number] Yes Apply 1 application topically 2 (two) times daily. Limit use for 2 weeks. Return to clinic with no improvement. Eulogio Bear, NP Taking Active   nystatin (MYCOSTATIN/NYSTOP) powder [id number] Yes Apply topically 3 (three) times daily.  Patient taking differently: Apply topically as needed.   , [name], MD Taking Active   oxyCODONE-acetaminophen (PERCOCET) 7.5-325 MG tablet [id number] Yes Take 1 tablet by mouth every 4 (four) hours as needed. Eulogio Bear, NP Taking Active   pantoprazole (PROTONIX) 40 MG tablet [id number] Yes TAKE 1 TABLET(40 MG) BY MOUTH DAILY [name], MD Taking Active   simvastatin (ZOCOR) 20 MG tablet [id number] Yes TAKE 1 TABLET(20 MG) BY MOUTH AT BEDTIME [name], MD Taking Active   tamsulosin (FLOMAX) 0.4 MG CAPS capsule [id number] Yes TAKE 1 CAPSULE(0.4 MG) BY MOUTH DAILY [name], MD Taking Active   tiZANidine (ZANAFLEX) 2 MG tablet [id number] Yes TAKE 1 TABLET(2 MG) BY MOUTH TWICE DAILY AS NEEDED Eulogio Bear, NP Taking Active   Triamcinolone Acetonide (TRIAMCINOLONE 0.1 % CREAM : EUCERIN) CREA [id number] Yes Apply 1 application topically 2 (two) times daily as needed.  Patient taking differently: Apply 1  application topically 2 (two) times daily as needed for rash.   [name], MD Taking Active             Patient Active Problem List   Diagnosis Date Noted   MGUS (monoclonal gammopathy of unknown significance) 03/03/2021   Chronic obstructive pulmonary disease ([place]) 02/17/2021   Common variable immunodeficiency ([place]) 02/17/2021   Osteoarthrosis 02/17/2021   Diverticular disease 01/05/2021   History of cerebrovascular accident 01/05/2021   Hiatal hernia 01/05/2021   Hyperlipidemia 01/05/2021   Glaucoma 01/05/2021   Esotropia of left eye 01/05/2021   Benign prostatic hyperplasia 01/05/2021   History of radiation therapy 01/05/2021   Anemia 01/05/2021   GI bleed 03/18/2020   Acute lower GI bleeding 03/03/2020   Pressure injury of skin 03/03/2020   Thoracic aortic aneurysm without rupture ([place]) 11/19/2019   Hardening of the aorta (main artery of the heart) ([place]) 11/19/2019   Protein-calorie malnutrition ([place]) 06/10/2019   Avitaminosis D 03/07/2019   Male hypogonadism 07/26/2018   Gynecomastia 06/27/2018   Malignant neoplasm of upper lobe of left lung ([place]) 04/06/2018   History of malignant neoplasm of parotid gland 02/07/2018   Adenoma of large intestine 11/09/2016   Adult hypothyroidism 09/09/2015   Chronic constipation 05/22/2015   Impetigo bullosa 10/23/2013   Chronic left shoulder pain 09/25/2013   Muscle weakness (generalized) 09/25/2013   Hypercalcemia 08/08/2013   Chronic kidney disease, stage 3 unspecified ([place]) 08/07/2013   AKI (acute kidney injury) ([place]) 07/24/2013   Acute kidney failure, unspecified ([place]) 07/24/2013   Adhesive capsulitis of left shoulder 05/14/2013   GERD (gastroesophageal reflux disease)    Carcinoma of parotid gland ([place]) 11/23/2012    Epidermoid cyst 10/05/2012   Neck pain 08/26/2012   Chronic abdominal pain 08/26/2012   Edema of lower extremity 08/24/2012   Hemiplegia of dominant side as late effect of cerebrovascular disease ([place]) 06/06/2012   Diarrhea 04/17/2012   Alimentary obesity 04/17/2012   Type 2 diabetes mellitus with diabetic neuropathy, unspecified ([place]) 02/13/2012   Recurrent boils 01/12/2012   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 ([place]) 07/15/2011   Hemorrhoids, internal 03/23/2011   Hepatitis 03/02/2011   Steatohepatitis 03/02/2011   Abdominal pain, right upper quadrant 12/16/2010   Abnormal levels of other serum enzymes 12/16/2010   Eosinophil count raised 12/23/2009   HEMATOCHEZIA 12/23/2009   Type 2 diabetes mellitus ([place]) 01/05/2009   Gout 01/05/2009   Hypertensive disorder 01/05/2009   BARRETTS ESOPHAGUS 01/05/2009   DIVERTICULOSIS OF COLON 01/05/2009   History of malignant lymphoma 01/05/2009    Immunization History  Administered Date(s) Administered   Fluad Quad(high Dose 65+) 09/06/2019, 09/02/2020   Influenza Split 08/24/2012   Influenza, High Dose Seasonal PF 10/31/2017, 10/02/2018   Influenza,inj,Quad PF,6+ Mos 10/09/2013, 09/05/2014, 11/23/2015, 08/16/2016   Moderna Sars-Covid-2 Vaccination 01/19/2020, 02/19/2020, 11/24/2020   PFIZER(Purple Top)SARS-COV-2 Vaccination 11/24/2020   Pneumococcal Conjugate-13 01/07/2014   Pneumococcal Polysaccharide-23 11/25/2012   Tdap 09/18/2019    Conditions to be addressed/monitored:  hypertension, GERD/barretts Esophagus, Type II DM, hypothyroidism, CKD, hyperlipidemia, CVA.  Care Plan : General Pharmacy (Adult)  Updates made by Edythe Clarity, RPH since 07/22/2021 12:00 AM     Problem: hypertension, GERD/barretts Esophagus, Type II DM, hypothyroidism, CKD, hyperlipidemia, CVA.   Priority: High  Onset Date: 03/04/2021     Long-Range Goal: Patient-Specific Goal   Start Date: 03/04/2021  Expected End Date: 09/04/2021   Recent Progress: On track  Priority: High  Note:   Current Barriers:  No specific barriers at this time  Pharmacist Clinical Goal(s):  Patient will achieve adherence to monitoring guidelines and medication adherence to achieve therapeutic efficacy maintain control of blood pressure and blood sugar as evidenced by home monitoring  contact provider office for questions/concerns as evidenced notation of same in electronic health record through collaboration with PharmD and provider.   Interventions: 1:1 collaboration with [name], MD regarding development and update of comprehensive plan of care as evidenced by provider attestation and co-signature Inter-disciplinary care team collaboration (see longitudinal plan of care) Comprehensive medication review performed; medication list updated in electronic medical record  Hypertension (BP goal <140/90) -Controlled -Current treatment: Amlodipine 14m daily Clonidine 0.259mTID Lisinopril 2027maily Metoprolol XL 106m67mily -Medications previously tried: none noted -Current home readings: 98/79 P70  -  141/100P 91 -Current dietary habits: up and down   -Denies hypotensive/hypertensive symptoms -Educated on BP goals and benefits of medications for prevention of heart attack, stroke and kidney damage; Importance of home blood pressure monitoring; Symptoms of hypotension and importance of maintaining adequate hydration; -Counseled to monitor BP at home daily, document, and provide log at future appointments -Recommended to continue current medication Recommended Contact providers if BP remains elevated above 140/90 consistently.  Update 07/22/21 Wife states BP has been in the "normal range" Denies any dizziness or HA's Encouraged to continue to monitor at home and document  No changes to meds at this time   Diabetes (A1c goal <7%) -Controlled -Current medications: Lantus 100u/ml 30 units at bedtime -Medications previously  tried: none noted -Current home glucose readings fasting glucose: 75-85 post prandial glucose: 160 was the highest it has been -Denies hypoglycemic/hyperglycemic symptoms -Current meal patterns:  Some days patient is not eating as much as others -Current exercise: minimal -Educated on A1c and blood sugar goals; Prevention and management of hypoglycemic episodes; Benefits of routine self-monitoring of blood sugar; Confirmed patient taking newly reduced dose of Lantus (36 units) -Counseled to check feet daily and get yearly eye exams -Recommended to continue current medication Some concern for hypoglycemia with patients lack of food intake as well as long acting insulin.  Recently reduced dose of Lantus to 36 units, however patient reports some blood sugars in the low 70s to 80s.  Noting age would consider further decrease of insulin to see fasting sugars > 90.  Will consult with PCP on proper dosing moving forward.  Update 07/22/21 Last visit decreased Lantus to 30 units hs Fasting Sugars - 90s Post Prandial - 150-160s Denies any recent hypoglycemia He is moving to a nursing home while his wife has surgery and recovers.  Encouraged them to reach out if they need help with meds during this transition process.  Continue current medications for now.  Patient Goals/Self-Care Activities Patient will:  - take medications as prescribed check glucose daily, document, and provide at future appointments check blood pressure daily, document, and provide at future appointments notify providers of any hypoglycemia.  Follow Up Plan: The care management team will reach out to the patient again over the next 120 days.         Medication Assistance: None required.  Patient affirms current coverage meets needs.  [id number] [place] [facility] [address]: [phone]: [phone] pill box? Yes -  Organized by wife Pt endorses 100% compliance  We discussed: Benefits of medication synchronization, packaging and delivery as well as enhanced pharmacist oversight with [employer]. Patient decided to: Continue current medication management strategy  Care Plan and Follow Up Patient Decision:  Patient agrees to Care Plan and Follow-up.  Plan: The care management team will reach out to the patient again over the next 120 days.  Beverly Milch, PharmD Clinical Pharmacist [place] [phone]

## 2021-07-27 ENCOUNTER — Telehealth: Payer: Self-pay | Admitting: Family Medicine

## 2021-07-27 NOTE — Telephone Encounter (Signed)
   Telephone encounter was:  Successful.  07/27/2021 Name: [name] MRN: [id number] DOB: 01/03/1946  [name] is a 75 y.o. year old male who is a primary care patient of Pickard, Cammie Mcgee, MD . The community resource team was consulted for assistance with  assisted living facilities.  Care guide performed the following interventions: Patient provided with information about care guide support team and interviewed to confirm resource needs Spoke with wife [name], she found a place for [name] through recommendations in her community and he will move in September  .  Follow Up Plan:  No further follow up planned at this time. The patient has been provided with needed resources.  April Green Care Guide, Embedded Care Coordination [place], Care Management Phone: [phone] Email: [email or web address]

## 2021-07-28 ENCOUNTER — Other Ambulatory Visit: Payer: Self-pay | Admitting: "Endocrinology

## 2021-08-09 ENCOUNTER — Other Ambulatory Visit: Payer: Self-pay | Admitting: *Deleted

## 2021-08-09 NOTE — Telephone Encounter (Signed)
Received call from patient wife, [name].   Requested refill on Oxycodone/APAP  Ok to refill??  Last office visit 07/16/2021.  Last refill 05/19/2021.

## 2021-08-10 MED ORDER — OXYCODONE-ACETAMINOPHEN 7.5-325 MG PO TABS: 1.0000 | ORAL_TABLET | ORAL | 0 refills | Status: DC | PRN

## 2021-08-14 ENCOUNTER — Other Ambulatory Visit: Payer: Self-pay | Admitting: Family Medicine

## 2021-08-15 ENCOUNTER — Other Ambulatory Visit: Payer: Self-pay | Admitting: Family Medicine

## 2021-08-17 ENCOUNTER — Ambulatory Visit: Payer: Self-pay | Admitting: Radiation Oncology

## 2021-08-17 ENCOUNTER — Other Ambulatory Visit: Payer: Self-pay | Admitting: Family Medicine

## 2021-08-23 ENCOUNTER — Ambulatory Visit: Payer: Medicare Other | Admitting: Nurse Practitioner

## 2021-09-01 ENCOUNTER — Ambulatory Visit: Payer: Medicare Other | Admitting: Nurse Practitioner

## 2021-09-01 ENCOUNTER — Telehealth: Payer: Self-pay | Admitting: *Deleted

## 2021-09-01 DIAGNOSIS — E782 Mixed hyperlipidemia: Secondary | ICD-10-CM

## 2021-09-01 DIAGNOSIS — E039 Hypothyroidism, unspecified: Secondary | ICD-10-CM

## 2021-09-01 DIAGNOSIS — E559 Vitamin D deficiency, unspecified: Secondary | ICD-10-CM

## 2021-09-01 DIAGNOSIS — N1832 Chronic kidney disease, stage 3b: Secondary | ICD-10-CM

## 2021-09-01 DIAGNOSIS — I10 Essential (primary) hypertension: Secondary | ICD-10-CM

## 2021-09-01 NOTE — Telephone Encounter (Signed)
Received call from [facility]. [phone]~ telephone  Reports that patient requires hospital bed for transfers and positioning. Also requested order for trapeze bar to be sent to [facility].   Ok to place orders?

## 2021-09-01 NOTE — Telephone Encounter (Signed)
DME Requirements  [facility] Bed- must meet one or more of the following criteria and both statements documented in the chart notes: Criteria: The patient has a medical condition that requires positioning of the body not feasible with an ordinary bed.  The patient requires positioning of the body in ways not feasible with an ordinary bed in order to alleviate pain. The patient requires the head of the bed to be elevated more than 30 degrees most of the time due to: CHF Chronic Pulmonary Disease Aspiration Precautions Dysphagia Dementia (increased risk during periods of extreme confusion) The patient requires traction equipment, which can only be attached to the hospital bed. Statements: Pillows and wedges have been tried and ruled out.  The patient requires frequent changes in the body position and/ or the patient has an immediate need for a quick change in body position.   Group 1 Surfaces- prevention and early intervention of pressure ulcer Gel overlay mattress Wheelchair cushions Must meet one of the following criteria from each category: Criteria Category 1: Inability to move Limited ability to move Pressure ulcer on trunk or pelvis Criteria Category 2: Malnutrition Incontinence Altered skin perception Poor circulation

## 2021-09-03 NOTE — Telephone Encounter (Signed)
Appointment scheduled for 09/07/2021.

## 2021-09-07 ENCOUNTER — Other Ambulatory Visit: Payer: Self-pay

## 2021-09-07 ENCOUNTER — Encounter: Payer: Self-pay | Admitting: Family Medicine

## 2021-09-07 ENCOUNTER — Ambulatory Visit (INDEPENDENT_AMBULATORY_CARE_PROVIDER_SITE_OTHER): Payer: Medicare Other | Admitting: Family Medicine

## 2021-09-07 VITALS — BP 134/76 | HR 86 | Temp 97.9°F | Resp 20

## 2021-09-07 DIAGNOSIS — E1122 Type 2 diabetes mellitus with diabetic chronic kidney disease: Secondary | ICD-10-CM | POA: Diagnosis not present

## 2021-09-07 DIAGNOSIS — Z794 Long term (current) use of insulin: Secondary | ICD-10-CM | POA: Diagnosis not present

## 2021-09-07 DIAGNOSIS — N1831 Chronic kidney disease, stage 3a: Secondary | ICD-10-CM | POA: Diagnosis not present

## 2021-09-07 DIAGNOSIS — D472 Monoclonal gammopathy: Secondary | ICD-10-CM

## 2021-09-07 DIAGNOSIS — N182 Chronic kidney disease, stage 2 (mild): Secondary | ICD-10-CM

## 2021-09-07 DIAGNOSIS — M7989 Other specified soft tissue disorders: Secondary | ICD-10-CM | POA: Diagnosis not present

## 2021-09-07 DIAGNOSIS — I10 Essential (primary) hypertension: Secondary | ICD-10-CM

## 2021-09-07 DIAGNOSIS — Z23 Encounter for immunization: Secondary | ICD-10-CM

## 2021-09-07 DIAGNOSIS — Z8673 Personal history of transient ischemic attack (TIA), and cerebral infarction without residual deficits: Secondary | ICD-10-CM | POA: Diagnosis not present

## 2021-09-07 MED ORDER — FUROSEMIDE 80 MG PO TABS: 80.0000 mg | ORAL_TABLET | Freq: Every day | ORAL | 3 refills | Status: DC

## 2021-09-07 NOTE — Progress Notes (Signed)
Subjective:    Patient ID: [name], male    DOB: July 30, 1946, 75 y.o.   MRN: [id number]  HPI Patient is a very pleasant 75 year old African-American gentleman who I am meeting just today.  He has a history of a remote stroke in 1998 leading to right-sided hemiparesis.  He is wheelchair-bound.  He also has a history of type 2 diabetes mellitus.  He is unable to stand without maximum assistance.  He has a history of stage Ib lung cancer currently managed by radiation oncology with Dr. [name].  He also has a history of MGUS currently being monitored by oncology.  He has a history of chronic kidney disease followed by nephrology and also has a history of type 2 diabetes mellitus.  He requires a hospital bed.  He is dependent upon the hospital bed to be fed in bed.  He requires an elevation of greater than 30 degrees to allow him to eat in bed without aspirating.  He also needs a hospital bed because it allows increased mobility and assist his son in getting him out of the bed and putting him in the wheelchair where he spends his entire day.  Also, the hospital bed allows them to help him stand in order to transport him to the bathroom.  He is unable to move his right arm or his right leg due to the hemiparesis caused by his stroke.  His son is concerned due to significant swelling.  He has +2 edema in the right leg and +1 edema in the left leg up to the knee.  He denies any angina.  He does report chronic chest pain that he has had for years.  He does report shortness of breath with activity however that he states has been going on for quite some time.  He has a 3/6 systolic ejection murmur on exam Past Medical History:  Diagnosis Date   Arthritis    Asthma    "hx of"   Barrett's esophagus    EGD 03/23/2011 & EGD 2/09 bx proven   BPH (benign prostatic hyperplasia)    Cancer of parotid gland ([place]) 11/23/12   Adenocarcinoma   Chronic abdominal pain    Chronic constipation    Colon polyp  03/23/2011   tubular adenoma, Dr. Gala Romney   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 ([place]) 07/15/2011   CVA (cerebral infarction) 1998   right sided deficit   Delayed gastric emptying 2018   Diverticulosis    TCS 03/23/11 pancolonic diverticula &TCS 5/08, pancolonic diverticula   DM (diabetes mellitus) ([place])    Edema of lower extremity 12/21/12   bilateral    Esotropia of left eye    GERD (gastroesophageal reflux disease)    Glaucoma (increased eye pressure)    Gout    Gout    Hemorrhagic colitis 06/06/2012.   Hemorrhoids, internal 03/23/2011   tcs by Dr. Gala Romney   Hepatitis    esosiniphilic, tx with prednisone   Hiatal hernia    History of radiation therapy 05/21/18- 05/30/18   Left Lung/ 54 Gy delivered in 3 fractions of 18 Gy. SBRT   HTN (hypertension)    Hx of radiation therapy 1974   right base of skull area-lymphoma   Hyperlipidemia    Lower facial weakness    Right   Lymphoma ([place]) 1974   XRT at [facility], right base of skull area   Neuropathy    Non-small cell lung cancer ([place]) dx'd 04/26/18  Peripheral edema    R>L legs   Rash    chronic, recurrent, R>L legs   Renal insufficiency    Steatohepatitis    liver biopsy 2009   Stroke [facility]) 1998   right hemiparesis/plegia   Past Surgical History:  Procedure Laterality Date   BIOPSY  12/01/2016   Procedure: BIOPSY;  Surgeon: Daneil Dolin, MD;  Location: [facility];  Service: Endoscopy;;  duodenum, gastric, esophagus   CHOLECYSTECTOMY     COLONOSCOPY  03/23/11   Dr. Gala Romney  pancolonic diverticula, hemorrhoids, tubular adenoma.. next tcs 03/2016   COLONOSCOPY WITH PROPOFOL N/A 12/01/2016   inadequate bowel prep precluded exam   ESOPHAGOGASTRODUODENOSCOPY  02/05/08   goblet cell metaplasia/negative for H.pylori   ESOPHAGOGASTRODUODENOSCOPY  03/23/11   Dr. Gala Romney, barretts, hiatal hernia   ESOPHAGOGASTRODUODENOSCOPY (EGD) WITH PROPOFOL N/A 12/01/2016   Dr. Gala Romney: Large amount of retained gastric contents precluded  completion of the stomach. Mucosal changes were found in the stomach. Erosions and somewhat scalloped appearing mucosa present, reactive gastritis/no H pylori. Barrett's esophagus noted, no dysplasia on biopsy. Duodenal biopsies taken as well, benign, no evidence of eosinophilia.   IR FLUORO GUIDED NEEDLE PLC ASPIRATION/INJECTION LOC  12/13/2018   IR KYPHO LUMBAR INC FX REDUCE BONE BX UNI/BIL CANNULATION INC/IMAGING  12/13/2018   IR RADIOLOGY PERIPHERAL GUIDED IV START  12/13/2018   IR US GUIDE VASC ACCESS LEFT  12/13/2018   MASS BIOPSY  11/01/2012   Procedure: NECK MASS BIOPSY;  Surgeon: Ascencion Dike, MD;  Location: [facility];  Service: ENT;  Laterality: Right;  Excisional Bx Right Neck Mass; attempted external jugular cutdown of left side   PAROTIDECTOMY  11/24/2012   Procedure: PAROTIDECTOMY;  Surgeon: Ascencion Dike, MD;  Location: [place];  Service: ENT;  Laterality: N/A;  Total parotidectomy   PLEURECTOMY     right lymph node removal Right    behind right ear   Right video-assisted thoracic surgery, pleurectomy, and pleurodesis  2011   VIDEO BRONCHOSCOPY WITH ENDOBRONCHIAL NAVIGATION N/A 04/26/2018   Procedure: VIDEO BRONCHOSCOPY WITH ENDOBRONCHIAL NAVIGATION;  Surgeon: Melrose Nakayama, MD;  Location: [place];  Service: Thoracic;  Laterality: N/A;   Current Outpatient Medications on File Prior to Visit  Medication Sig Dispense Refill   allopurinol (ZYLOPRIM) 300 MG tablet TAKE 1 TABLET(300 MG) BY MOUTH DAILY 90 tablet 2   amLODipine (NORVASC) 10 MG tablet Take 1 tablet (10 mg total) by mouth daily. 90 tablet 2   cloNIDine (CATAPRES) 0.2 MG tablet Take 1 tablet (0.2 mg total) by mouth 3 (three) times daily. TAKE 1 TABLET(0.2 MG) BY MOUTH THREE TIMES DAILY 270 tablet 2   diclofenac sodium (VOLTAREN) 1 % GEL Apply twice a day for affected area on ankle as needed 100 g 1   fluticasone (FLONASE) 50 MCG/ACT nasal spray Place 2 sprays into both nostrils daily. 16 g 2   gabapentin (NEURONTIN) 300 MG capsule  Take 2 capsules (600 mg total) by mouth 2 (two) times daily. May also take 2 capsules (600 mg total) daily as needed. 540 capsule 3   glucose blood (ONETOUCH VERIO) test strip Use as directed to check Blood Glucose twice daily 150 strip 5   insulin glargine (LANTUS SOLOSTAR) 100 UNIT/ML Solostar Pen Inject 30 Units into the skin at bedtime. 15 mL 2   iron polysaccharides (NIFEREX) 150 MG capsule TAKE 1 CAPSULE(150 MG) BY MOUTH DAILY 90 capsule 2   Lancets (ONETOUCH DELICA PLUS [id number]) MISC USE TO TEST BLOOD  SUGAR FOUR TIMES DAILY 200 each 2   lisinopril (ZESTRIL) 20 MG tablet TAKE 1 TABLET(20 MG) BY MOUTH DAILY 90 tablet 2   lubiprostone (AMITIZA) 24 MCG capsule TAKE 1 CAPSULE(24 MCG) BY MOUTH TWICE DAILY WITH A MEAL 90 capsule 2   metoprolol succinate (TOPROL-XL) 50 MG 24 hr tablet TAKE 1 TABLET BY MOUTH EVERY DAY WITH OR IMMEDIATELY FOLLOWING A MEAL 90 tablet 2   nystatin (MYCOSTATIN/NYSTOP) powder Apply topically 3 (three) times daily. (Patient taking differently: Apply topically as needed.) 60 g 2   oxyCODONE-acetaminophen (PERCOCET) 7.5-325 MG tablet Take 1 tablet by mouth every 4 (four) hours as needed. 180 tablet 0   pantoprazole (PROTONIX) 40 MG tablet TAKE 1 TABLET(40 MG) BY MOUTH DAILY 90 tablet 2   simvastatin (ZOCOR) 20 MG tablet TAKE 1 TABLET(20 MG) BY MOUTH AT BEDTIME 90 tablet 2   tamsulosin (FLOMAX) 0.4 MG CAPS capsule TAKE 1 CAPSULE(0.4 MG) BY MOUTH DAILY 90 capsule 2   tiZANidine (ZANAFLEX) 2 MG tablet TAKE 1 TABLET(2 MG) BY MOUTH TWICE DAILY AS NEEDED 180 tablet 2   Triamcinolone Acetonide (TRIAMCINOLONE 0.1 % CREAM : EUCERIN) CREA Apply 1 application topically 2 (two) times daily as needed. (Patient taking differently: Apply 1 application topically 2 (two) times daily as needed for rash.) 1 each 2   No current facility-administered medications on file prior to visit.   No Known Allergies Social History   Socioeconomic History   Marital status: Married    Spouse name: Not  on file   Number of children: 1   Years of education: Not on file   Highest education level: Not on file  Occupational History   Occupation: disabled  Tobacco Use   Smoking status: Former    Packs/day: 3.00    Years: 25.00    Pack years: 75.00    Types: Cigarettes    Quit date: 03/01/1997    Years since quitting: 24.5   Smokeless tobacco: Never  Vaping Use   Vaping Use: Never used  Substance and Sexual Activity   Alcohol use: No   Drug use: No   Sexual activity: Not Currently  Other Topics Concern   Not on file  Social History Narrative   Not on file   Social Determinants of Health   Financial Resource Strain: Not on file  Food Insecurity: Not on file  Transportation Needs: Not on file  Physical Activity: Not on file  Stress: Not on file  Social Connections: Not on file  Intimate Partner Violence: Not on file       Review of Systems  All other systems reviewed and are negative.     Objective:   Physical Exam Vitals reviewed.  Constitutional:      General: He is not in acute distress.    Appearance: He is not ill-appearing, toxic-appearing or diaphoretic.  Cardiovascular:     Rate and Rhythm: Normal rate and regular rhythm.     Heart sounds: Murmur heard.  Pulmonary:     Effort: Pulmonary effort is normal. No respiratory distress.     Breath sounds: Normal breath sounds. No wheezing, rhonchi or rales.  Abdominal:     General: Abdomen is flat. Bowel sounds are normal.     Palpations: Abdomen is soft.  Musculoskeletal:     Right lower leg: Edema present.     Left lower leg: Edema present.  Skin:    Coloration: Skin is not jaundiced.     Findings: No erythema.  Neurological:  Mental Status: He is alert and oriented to person, place, and time. Mental status is at baseline.     Motor: Weakness present.     Gait: Gait abnormal.    Confined to wheelchair, right hemiparesis unable to move right arm or right leg.  Also has aphasia with slurred speech.   Patient is difficult to understand.      Assessment & Plan:  Leg swelling - Plan: COMPLETE METABOLIC PANEL WITH GFR, Brain natriuretic peptide, CBC with Differential/Platelet  Type 2 diabetes mellitus with stage 2 chronic kidney disease, with long-term current use of insulin (HCC) - Plan: Hemoglobin A1c  Primary hypertension  History of CVA (cerebrovascular accident)  Stage 3a chronic kidney disease (HCC)  MGUS (monoclonal gammopathy of unknown significance) Patient has significant swelling.  Check CMP to monitor renal function but I will also check a BNP.  Given the murmur if the BNP is elevated, I would recommend an echocardiogram to evaluate for congestive heart failure.  May require modifications in his chronic medications to manage congestive heart failure but in the meantime I would increase Lasix to 80 mg a day.  I plan to supplement potassium if necessary based on his potassium level on the labs I am drawing today.  I would also check an A1c and if his A1c is elevated, consider Wilder Glade or Jardiance for possible renal protection as well as the benefit in patients with congestive heart failure.  I will write a prescription for hospital bed as the patient certainly would benefit from 1 and assisting feeding him in bed, bathing in bed, and mobility.  They require a bed that can be lowered to help him stand to his feet and transition into his wheelchair.  He will also require a trapeze bar so that he can help pull himself to a seated position and transition into the wheelchair.  He would also benefit from a gel overlay to protect against pressure sores due to his immobility.

## 2021-09-10 ENCOUNTER — Ambulatory Visit: Payer: Medicare Other | Admitting: Podiatry

## 2021-09-13 ENCOUNTER — Ambulatory Visit: Payer: Medicare Other | Admitting: Podiatry

## 2021-09-14 DIAGNOSIS — Z794 Long term (current) use of insulin: Secondary | ICD-10-CM | POA: Diagnosis not present

## 2021-09-14 DIAGNOSIS — R600 Localized edema: Secondary | ICD-10-CM | POA: Diagnosis not present

## 2021-09-14 DIAGNOSIS — M7989 Other specified soft tissue disorders: Secondary | ICD-10-CM | POA: Diagnosis not present

## 2021-09-14 DIAGNOSIS — E1122 Type 2 diabetes mellitus with diabetic chronic kidney disease: Secondary | ICD-10-CM | POA: Diagnosis not present

## 2021-09-14 DIAGNOSIS — N182 Chronic kidney disease, stage 2 (mild): Secondary | ICD-10-CM | POA: Diagnosis not present

## 2021-09-15 DIAGNOSIS — R600 Localized edema: Secondary | ICD-10-CM | POA: Diagnosis not present

## 2021-09-15 DIAGNOSIS — I69959 Hemiplegia and hemiparesis following unspecified cerebrovascular disease affecting unspecified side: Secondary | ICD-10-CM | POA: Diagnosis not present

## 2021-09-15 LAB — CBC WITH DIFFERENTIAL/PLATELET
Absolute Monocytes: 545 cells/uL (ref 200–950)
Basophils Absolute: 56 cells/uL (ref 0–200)
Basophils Relative: 0.6 %
Eosinophils Absolute: 451 cells/uL (ref 15–500)
Eosinophils Relative: 4.8 %
HCT: 37.7 % — ABNORMAL LOW (ref 38.5–50.0)
Hemoglobin: 12.1 g/dL — ABNORMAL LOW (ref 13.2–17.1)
Lymphs Abs: 1664 cells/uL (ref 850–3900)
MCH: 23.4 pg — ABNORMAL LOW (ref 27.0–33.0)
MCHC: 32.1 g/dL (ref 32.0–36.0)
MCV: 72.8 fL — ABNORMAL LOW (ref 80.0–100.0)
Monocytes Relative: 5.8 %
Neutro Abs: 6683 cells/uL (ref 1500–7800)
Neutrophils Relative %: 71.1 %
Platelets: 408 10*3/uL — ABNORMAL HIGH (ref 140–400)
RBC: 5.18 10*6/uL (ref 4.20–5.80)
RDW: 18.5 % — ABNORMAL HIGH (ref 11.0–15.0)
Total Lymphocyte: 17.7 %
WBC: 9.4 10*3/uL (ref 3.8–10.8)

## 2021-09-15 LAB — COMPLETE METABOLIC PANEL WITH GFR
AG Ratio: 1 (calc) (ref 1.0–2.5)
ALT: 7 U/L — ABNORMAL LOW (ref 9–46)
AST: 9 U/L — ABNORMAL LOW (ref 10–35)
Albumin: 3.7 g/dL (ref 3.6–5.1)
Alkaline phosphatase (APISO): 114 U/L (ref 35–144)
BUN/Creatinine Ratio: 14 (calc) (ref 6–22)
BUN: 30 mg/dL — ABNORMAL HIGH (ref 7–25)
CO2: 27 mmol/L (ref 20–32)
Calcium: 10 mg/dL (ref 8.6–10.3)
Chloride: 106 mmol/L (ref 98–110)
Creat: 2.07 mg/dL — ABNORMAL HIGH (ref 0.70–1.28)
Globulin: 3.7 g/dL (calc) (ref 1.9–3.7)
Glucose, Bld: 95 mg/dL (ref 65–99)
Potassium: 4.9 mmol/L (ref 3.5–5.3)
Sodium: 142 mmol/L (ref 135–146)
Total Bilirubin: 0.4 mg/dL (ref 0.2–1.2)
Total Protein: 7.4 g/dL (ref 6.1–8.1)
eGFR: 33 mL/min/{1.73_m2} — ABNORMAL LOW (ref >=60)

## 2021-09-15 LAB — BRAIN NATRIURETIC PEPTIDE: Brain Natriuretic Peptide: 5 pg/mL (ref <100)

## 2021-09-15 LAB — HEMOGLOBIN A1C
Hgb A1c MFr Bld: 6.9 % of total Hgb — ABNORMAL HIGH (ref <5.7)
Mean Plasma Glucose: 151 mg/dL
eAG (mmol/L): 8.4 mmol/L

## 2021-09-21 ENCOUNTER — Other Ambulatory Visit: Payer: Self-pay

## 2021-09-21 ENCOUNTER — Ambulatory Visit (INDEPENDENT_AMBULATORY_CARE_PROVIDER_SITE_OTHER): Payer: Medicare Other | Admitting: Family Medicine

## 2021-09-21 VITALS — BP 128/74 | HR 76 | Temp 97.2°F | Ht 73.0 in

## 2021-09-21 DIAGNOSIS — I890 Lymphedema, not elsewhere classified: Secondary | ICD-10-CM | POA: Diagnosis not present

## 2021-09-21 DIAGNOSIS — M7989 Other specified soft tissue disorders: Secondary | ICD-10-CM | POA: Diagnosis not present

## 2021-09-21 LAB — BASIC METABOLIC PANEL WITH GFR
BUN/Creatinine Ratio: 15 (calc) (ref 6–22)
BUN: 25 mg/dL (ref 7–25)
CO2: 24 mmol/L (ref 20–32)
Calcium: 9.7 mg/dL (ref 8.6–10.3)
Chloride: 103 mmol/L (ref 98–110)
Creat: 1.69 mg/dL — ABNORMAL HIGH (ref 0.70–1.28)
Glucose, Bld: 116 mg/dL — ABNORMAL HIGH (ref 65–99)
Potassium: 4.4 mmol/L (ref 3.5–5.3)
Sodium: 139 mmol/L (ref 135–146)
eGFR: 42 mL/min/{1.73_m2} — ABNORMAL LOW (ref >=60)

## 2021-09-21 NOTE — Progress Notes (Signed)
Subjective:    Patient ID: [name], male    DOB: 01/11/1946, 75 y.o.   MRN: [id number]  HPI Patient is a very pleasant 75 year old African-American gentleman who I am meeting just today.  He has a history of a remote stroke in 1998 leading to right-sided hemiparesis.  He is wheelchair-bound.  He also has a history of type 2 diabetes mellitus.  He is unable to stand without maximum assistance.  He has a history of stage Ib lung cancer currently managed by radiation oncology with Dr. [name].  He also has a history of MGUS currently being monitored by oncology.  He has a history of chronic kidney disease followed by nephrology and also has a history of type 2 diabetes mellitus.  He requires a hospital bed.  He is dependent upon the hospital bed to be fed in bed.  He requires an elevation of greater than 30 degrees to allow him to eat in bed without aspirating.  He also needs a hospital bed because it allows increased mobility and assist his son in getting him out of the bed and putting him in the wheelchair where he spends his entire day.  Also, the hospital bed allows them to help him stand in order to transport him to the bathroom.  He is unable to move his right arm or his right leg due to the hemiparesis caused by his stroke.  His son is concerned due to significant swelling.  He has +2 edema in the right leg and +1 edema in the left leg up to the knee.  He denies any angina.  He does report chronic chest pain that he has had for years.  He does report shortness of breath with activity however that he states has been going on for quite some time.  He has a 3/6 systolic ejection murmur on exam.  At that time, my plan was:  Patient has significant swelling.  Check CMP to monitor renal function but I will also check a BNP.  Given the murmur if the BNP is elevated, I would recommend an echocardiogram to evaluate for congestive heart failure.  May require modifications in his chronic medications to  manage congestive heart failure but in the meantime I would increase Lasix to 80 mg a day.  I plan to supplement potassium if necessary based on his potassium level on the labs I am drawing today.  I would also check an A1c and if his A1c is elevated, consider Wilder Glade or Jardiance for possible renal protection as well as the benefit in patients with congestive heart failure.  I will write a prescription for hospital bed as the patient certainly would benefit from 1 and assisting feeding him in bed, bathing in bed, and mobility.  They require a bed that can be lowered to help him stand to his feet and transition into his wheelchair.  He will also require a trapeze bar so that he can help pull himself to a seated position and transition into the wheelchair.  He would also benefit from a gel overlay to protect against pressure sores due to his immobility.  09/21/21 Despite increasing his Lasix to 80 mg a day, the patient's swelling in his lower legs has not improved.  Patient has dependent edema in both legs distal to his knees.  He has essentially +2 edema in his right leg and borderline +2 edema in his left leg.  I believe that this is third spaced fluid due to  constantly sitting and lack of circulation.  BNP was normal suggesting against congestive heart failure.  Therefore I feel that this is equivalent to lymphedema.  Office Visit on 09/07/2021  Component Date Value Ref Range Status   Glucose, Bld 09/14/2021 95  65 - 99 mg/dL Final   Comment: .            Fasting reference interval .    BUN 09/14/2021 30 (A) 7 - 25 mg/dL Final   Creat 09/14/2021 2.07 (A) 0.70 - 1.28 mg/dL Final   eGFR 09/14/2021 33 (A) > OR = 60 mL/min/1.109m Final   Comment: The eGFR is based on the CKD-EPI 2021 equation. To calculate  the new eGFR from a previous Creatinine or Cystatin C result, go to https://www.kidney.org/professionals/ kdoqi/gfr%5Fcalculator    BUN/Creatinine Ratio 09/14/2021 14  6 - 22 (calc) Final    Sodium 09/14/2021 142  135 - 146 mmol/L Final   Potassium 09/14/2021 4.9  3.5 - 5.3 mmol/L Final   Chloride 09/14/2021 106  98 - 110 mmol/L Final   CO2 09/14/2021 27  20 - 32 mmol/L Final   Calcium 09/14/2021 10.0  8.6 - 10.3 mg/dL Final   Total Protein 09/14/2021 7.4  6.1 - 8.1 g/dL Final   Albumin 09/14/2021 3.7  3.6 - 5.1 g/dL Final   Globulin 09/14/2021 3.7  1.9 - 3.7 g/dL (calc) Final   AG Ratio 09/14/2021 1.0  1.0 - 2.5 (calc) Final   Total Bilirubin 09/14/2021 0.4  0.2 - 1.2 mg/dL Final   Alkaline phosphatase (APISO) 09/14/2021 114  35 - 144 U/L Final   AST 09/14/2021 9 (A) 10 - 35 U/L Final   ALT 09/14/2021 7 (A) 9 - 46 U/L Final   WBC 09/14/2021 9.4  3.8 - 10.8 Thousand/uL Final   RBC 09/14/2021 5.18  4.20 - 5.80 Million/uL Final   Hemoglobin 09/14/2021 12.1 (A) 13.2 - 17.1 g/dL Final   HCT 09/14/2021 37.7 (A) 38.5 - 50.0 % Final   MCV 09/14/2021 72.8 (A) 80.0 - 100.0 fL Final   MCH 09/14/2021 23.4 (A) 27.0 - 33.0 pg Final   MCHC 09/14/2021 32.1  32.0 - 36.0 g/dL Final   RDW 09/14/2021 18.5 (A) 11.0 - 15.0 % Final   Platelets 09/14/2021 408 (A) 140 - 400 Thousand/uL Final   MPV 09/14/2021   7.5 - 12.5 fL Final   Comment: Due to platelet or RBC variability in size or shape the result cannot be reported accurately.    Neutro Abs 09/14/2021 6,683  1,500 - 7,800 cells/uL Final   Lymphs Abs 09/14/2021 1,664  850 - 3,900 cells/uL Final   Absolute Monocytes 09/14/2021 545  200 - 950 cells/uL Final   Eosinophils Absolute 09/14/2021 451  15 - 500 cells/uL Final   Basophils Absolute 09/14/2021 56  0 - 200 cells/uL Final   Neutrophils Relative % 09/14/2021 71.1  % Final   Total Lymphocyte 09/14/2021 17.7  % Final   Monocytes Relative 09/14/2021 5.8  % Final   Eosinophils Relative 09/14/2021 4.8  % Final   Basophils Relative 09/14/2021 0.6  % Final   Hgb A1c MFr Bld 09/14/2021 6.9 (A) <5.7 % of total Hgb Final   Comment: For someone without known diabetes, a hemoglobin A1c value of  6.5% or greater indicates that they may have  diabetes and this should be confirmed with a follow-up  test. . For someone with known diabetes, a value <7% indicates  that their diabetes is well controlled and a  value  greater than or equal to 7% indicates suboptimal  control. A1c targets should be individualized based on  duration of diabetes, age, comorbid conditions, and  other considerations. . Currently, no consensus exists regarding use of hemoglobin A1c for diagnosis of diabetes for children. .    Mean Plasma Glucose 09/14/2021 151  mg/dL Final   eAG (mmol/L) 09/14/2021 8.4  mmol/L Final   Brain Natriuretic Peptide 09/14/2021 5  <100 pg/mL Final   Comment: . BNP levels increase with age in the general population with the highest values seen in individuals greater than 77 years of age. Reference: J. Am. [place] Ar. Cardiol. 2002; 84:665-993. [name]     Past Medical History:  Diagnosis Date   Arthritis    Asthma    "hx of"   Barrett's esophagus    EGD 03/23/2011 & EGD 2/09 bx proven   BPH (benign prostatic hyperplasia)    Cancer of parotid gland ([place]) 11/23/12   Adenocarcinoma   Chronic abdominal pain    Chronic constipation    Colon polyp 03/23/2011   tubular adenoma, Dr. Gala Romney   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 ([place]) 07/15/2011   CVA (cerebral infarction) 1998   right sided deficit   Delayed gastric emptying 2018   Diverticulosis    TCS 03/23/11 pancolonic diverticula &TCS 5/08, pancolonic diverticula   DM (diabetes mellitus) ([place])    Edema of lower extremity 12/21/12   bilateral    Esotropia of left eye    GERD (gastroesophageal reflux disease)    Glaucoma (increased eye pressure)    Gout    Gout    Hemorrhagic colitis 06/06/2012.   Hemorrhoids, internal 03/23/2011   tcs by Dr. Gala Romney   Hepatitis    esosiniphilic, tx with prednisone   Hiatal hernia    History of radiation therapy 05/21/18- 05/30/18   Left Lung/ 54 Gy delivered in 3 fractions of 18 Gy.  SBRT   HTN (hypertension)    Hx of radiation therapy 1974   right base of skull area-lymphoma   Hyperlipidemia    Lower facial weakness    Right   Lymphoma ([place]) 1974   XRT at [facility], right base of skull area   Neuropathy    Non-small cell lung cancer ([place]) dx'd 04/26/18   Peripheral edema    R>L legs   Rash    chronic, recurrent, R>L legs   Renal insufficiency    Steatohepatitis    liver biopsy 2009   Stroke [facility]) 1998   right hemiparesis/plegia   Past Surgical History:  Procedure Laterality Date   BIOPSY  12/01/2016   Procedure: BIOPSY;  Surgeon: Daneil Dolin, MD;  Location: [facility];  Service: Endoscopy;;  duodenum, gastric, esophagus   CHOLECYSTECTOMY     COLONOSCOPY  03/23/11   Dr. Gala Romney  pancolonic diverticula, hemorrhoids, tubular adenoma.. next tcs 03/2016   COLONOSCOPY WITH PROPOFOL N/A 12/01/2016   inadequate bowel prep precluded exam   ESOPHAGOGASTRODUODENOSCOPY  02/05/08   goblet cell metaplasia/negative for H.pylori   ESOPHAGOGASTRODUODENOSCOPY  03/23/11   Dr. Gala Romney, barretts, hiatal hernia   ESOPHAGOGASTRODUODENOSCOPY (EGD) WITH PROPOFOL N/A 12/01/2016   Dr. Gala Romney: Large amount of retained gastric contents precluded completion of the stomach. Mucosal changes were found in the stomach. Erosions and somewhat scalloped appearing mucosa present, reactive gastritis/no H pylori. Barrett's esophagus noted, no dysplasia on biopsy. Duodenal biopsies taken as well, benign, no evidence of eosinophilia.   IR FLUORO GUIDED NEEDLE PLC ASPIRATION/INJECTION LOC  12/13/2018   IR KYPHO LUMBAR INC FX REDUCE BONE BX UNI/BIL CANNULATION INC/IMAGING  12/13/2018   IR RADIOLOGY PERIPHERAL GUIDED IV START  12/13/2018   IR US GUIDE VASC ACCESS LEFT  12/13/2018   MASS BIOPSY  11/01/2012   Procedure: NECK MASS BIOPSY;  Surgeon: Ascencion Dike, MD;  Location: [facility];  Service: ENT;  Laterality: Right;  Excisional Bx Right Neck Mass; attempted external jugular cutdown of left side   PAROTIDECTOMY   11/24/2012   Procedure: PAROTIDECTOMY;  Surgeon: Ascencion Dike, MD;  Location: [place];  Service: ENT;  Laterality: N/A;  Total parotidectomy   PLEURECTOMY     right lymph node removal Right    behind right ear   Right video-assisted thoracic surgery, pleurectomy, and pleurodesis  2011   VIDEO BRONCHOSCOPY WITH ENDOBRONCHIAL NAVIGATION N/A 04/26/2018   Procedure: VIDEO BRONCHOSCOPY WITH ENDOBRONCHIAL NAVIGATION;  Surgeon: Melrose Nakayama, MD;  Location: [place];  Service: Thoracic;  Laterality: N/A;   Current Outpatient Medications on File Prior to Visit  Medication Sig Dispense Refill   allopurinol (ZYLOPRIM) 300 MG tablet TAKE 1 TABLET(300 MG) BY MOUTH DAILY 90 tablet 2   amLODipine (NORVASC) 10 MG tablet Take 1 tablet (10 mg total) by mouth daily. 90 tablet 2   cloNIDine (CATAPRES) 0.2 MG tablet Take 1 tablet (0.2 mg total) by mouth 3 (three) times daily. TAKE 1 TABLET(0.2 MG) BY MOUTH THREE TIMES DAILY 270 tablet 2   diclofenac sodium (VOLTAREN) 1 % GEL Apply twice a day for affected area on ankle as needed 100 g 1   fluticasone (FLONASE) 50 MCG/ACT nasal spray Place 2 sprays into both nostrils daily. 16 g 2   furosemide (LASIX) 80 MG tablet Take 1 tablet (80 mg total) by mouth daily. 30 tablet 3   gabapentin (NEURONTIN) 300 MG capsule Take 2 capsules (600 mg total) by mouth 2 (two) times daily. May also take 2 capsules (600 mg total) daily as needed. 540 capsule 3   glucose blood (ONETOUCH VERIO) test strip Use as directed to check Blood Glucose twice daily 150 strip 5   insulin glargine (LANTUS SOLOSTAR) 100 UNIT/ML Solostar Pen Inject 30 Units into the skin at bedtime. 15 mL 2   iron polysaccharides (NIFEREX) 150 MG capsule TAKE 1 CAPSULE(150 MG) BY MOUTH DAILY 90 capsule 2   Lancets (ONETOUCH DELICA PLUS [id number]) MISC USE TO TEST BLOOD SUGAR FOUR TIMES DAILY 200 each 2   lisinopril (ZESTRIL) 20 MG tablet TAKE 1 TABLET(20 MG) BY MOUTH DAILY 90 tablet 2   lubiprostone (AMITIZA) 24 MCG  capsule TAKE 1 CAPSULE(24 MCG) BY MOUTH TWICE DAILY WITH A MEAL 90 capsule 2   metoprolol succinate (TOPROL-XL) 50 MG 24 hr tablet TAKE 1 TABLET BY MOUTH EVERY DAY WITH OR IMMEDIATELY FOLLOWING A MEAL 90 tablet 2   nystatin (MYCOSTATIN/NYSTOP) powder Apply topically 3 (three) times daily. (Patient taking differently: Apply topically as needed.) 60 g 2   oxyCODONE-acetaminophen (PERCOCET) 7.5-325 MG tablet Take 1 tablet by mouth every 4 (four) hours as needed. 180 tablet 0   pantoprazole (PROTONIX) 40 MG tablet TAKE 1 TABLET(40 MG) BY MOUTH DAILY 90 tablet 2   simvastatin (ZOCOR) 20 MG tablet TAKE 1 TABLET(20 MG) BY MOUTH AT BEDTIME 90 tablet 2   tamsulosin (FLOMAX) 0.4 MG CAPS capsule TAKE 1 CAPSULE(0.4 MG) BY MOUTH DAILY 90 capsule 2   tiZANidine (ZANAFLEX) 2 MG tablet TAKE 1 TABLET(2 MG) BY MOUTH TWICE DAILY AS NEEDED 180 tablet 2  Triamcinolone Acetonide (TRIAMCINOLONE 0.1 % CREAM : EUCERIN) CREA Apply 1 application topically 2 (two) times daily as needed. (Patient taking differently: Apply 1 application topically 2 (two) times daily as needed for rash.) 1 each 2   No current facility-administered medications on file prior to visit.   No Known Allergies Social History   Socioeconomic History   Marital status: Married    Spouse name: Not on file   Number of children: 1   Years of education: Not on file   Highest education level: Not on file  Occupational History   Occupation: disabled  Tobacco Use   Smoking status: Former    Packs/day: 3.00    Years: 25.00    Pack years: 75.00    Types: Cigarettes    Quit date: 03/01/1997    Years since quitting: 24.5   Smokeless tobacco: Never  Vaping Use   Vaping Use: Never used  Substance and Sexual Activity   Alcohol use: No   Drug use: No   Sexual activity: Not Currently  Other Topics Concern   Not on file  Social History Narrative   Not on file   Social Determinants of Health   Financial Resource Strain: Not on file  Food  Insecurity: Not on file  Transportation Needs: Not on file  Physical Activity: Not on file  Stress: Not on file  Social Connections: Not on file  Intimate Partner Violence: Not on file       Review of Systems  All other systems reviewed and are negative.     Objective:   Physical Exam Vitals reviewed.  Constitutional:      General: He is not in acute distress.    Appearance: He is not ill-appearing, toxic-appearing or diaphoretic.  Cardiovascular:     Rate and Rhythm: Normal rate and regular rhythm.     Heart sounds: Murmur heard.  Pulmonary:     Effort: Pulmonary effort is normal. No respiratory distress.     Breath sounds: Normal breath sounds. No wheezing, rhonchi or rales.  Abdominal:     General: Abdomen is flat. Bowel sounds are normal.     Palpations: Abdomen is soft.  Musculoskeletal:     Right lower leg: Edema present.     Left lower leg: Edema present.  Skin:    Coloration: Skin is not jaundiced.     Findings: No erythema.  Neurological:     Mental Status: He is alert and oriented to person, place, and time. Mental status is at baseline.     Motor: Weakness present.     Gait: Gait abnormal.    Confined to wheelchair, right hemiparesis unable to move right arm or right leg.  Also has aphasia with slurred speech.  Patient is difficult to understand.      Assessment & Plan:   Leg swelling - Plan: BASIC METABOLIC PANEL WITH GFR  Lymphedema Rather than increase his diuretic, I will keep him at 80 mg a day.  Check BMP to ensure potassium and renal function is tolerating the higher dose of the diuretic.  Instead I will encourage him to wear knee-high zip up compression hose 15 to 20 mmHg daily to try to help with lymphedema fluid.  Await to see his response to compression therapy

## 2021-09-23 ENCOUNTER — Encounter: Payer: Self-pay | Admitting: *Deleted

## 2021-09-23 ENCOUNTER — Inpatient Hospital Stay: Payer: Medicare Other | Attending: Family Medicine

## 2021-09-23 DIAGNOSIS — D472 Monoclonal gammopathy: Secondary | ICD-10-CM | POA: Insufficient documentation

## 2021-09-28 ENCOUNTER — Telehealth: Payer: Self-pay | Admitting: Pharmacist

## 2021-09-28 NOTE — Progress Notes (Signed)
Chronic Care Management Pharmacy Assistant   Name: [name]  MRN: [id number] DOB: 1946-11-03  Reason for Encounter: Disease State For DM.   Conditions to be addressed/monitored: hypertension, GERD/barretts Esophagus, Type II DM, hypothyroidism, CKD, hyperlipidemia, CVA.  Recent office visits:  09/21/21 Dr. Dennard Schaumann For leg swelling. No medication changes.  09/07/21 Dr. Dennard Schaumann For leg swelling. Per note:Increase Lasix to 80 mg a day  Recent consult visits:  None since   Hospital visits:  None since   Medications: Outpatient Encounter Medications as of 09/28/2021  Medication Sig   allopurinol (ZYLOPRIM) 300 MG tablet TAKE 1 TABLET(300 MG) BY MOUTH DAILY   amLODipine (NORVASC) 10 MG tablet Take 1 tablet (10 mg total) by mouth daily.   cloNIDine (CATAPRES) 0.2 MG tablet Take 1 tablet (0.2 mg total) by mouth 3 (three) times daily. TAKE 1 TABLET(0.2 MG) BY MOUTH THREE TIMES DAILY   diclofenac sodium (VOLTAREN) 1 % GEL Apply twice a day for affected area on ankle as needed   fluticasone (FLONASE) 50 MCG/ACT nasal spray Place 2 sprays into both nostrils daily.   furosemide (LASIX) 80 MG tablet Take 1 tablet (80 mg total) by mouth daily.   gabapentin (NEURONTIN) 300 MG capsule Take 2 capsules (600 mg total) by mouth 2 (two) times daily. May also take 2 capsules (600 mg total) daily as needed.   glucose blood (ONETOUCH VERIO) test strip Use as directed to check Blood Glucose twice daily   insulin glargine (LANTUS SOLOSTAR) 100 UNIT/ML Solostar Pen Inject 30 Units into the skin at bedtime.   iron polysaccharides (NIFEREX) 150 MG capsule TAKE 1 CAPSULE(150 MG) BY MOUTH DAILY   Lancets (ONETOUCH DELICA PLUS [id number]) MISC USE TO TEST BLOOD SUGAR FOUR TIMES DAILY   lisinopril (ZESTRIL) 20 MG tablet TAKE 1 TABLET(20 MG) BY MOUTH DAILY   lubiprostone (AMITIZA) 24 MCG capsule TAKE 1 CAPSULE(24 MCG) BY MOUTH TWICE DAILY WITH A MEAL   metoprolol succinate (TOPROL-XL) 50 MG 24 hr tablet  TAKE 1 TABLET BY MOUTH EVERY DAY WITH OR IMMEDIATELY FOLLOWING A MEAL   nystatin (MYCOSTATIN/NYSTOP) powder Apply topically 3 (three) times daily. (Patient taking differently: Apply topically as needed.)   oxyCODONE-acetaminophen (PERCOCET) 7.5-325 MG tablet Take 1 tablet by mouth every 4 (four) hours as needed.   pantoprazole (PROTONIX) 40 MG tablet TAKE 1 TABLET(40 MG) BY MOUTH DAILY   simvastatin (ZOCOR) 20 MG tablet TAKE 1 TABLET(20 MG) BY MOUTH AT BEDTIME   tamsulosin (FLOMAX) 0.4 MG CAPS capsule TAKE 1 CAPSULE(0.4 MG) BY MOUTH DAILY   tiZANidine (ZANAFLEX) 2 MG tablet TAKE 1 TABLET(2 MG) BY MOUTH TWICE DAILY AS NEEDED   Triamcinolone Acetonide (TRIAMCINOLONE 0.1 % CREAM : EUCERIN) CREA Apply 1 application topically 2 (two) times daily as needed. (Patient taking differently: Apply 1 application topically 2 (two) times daily as needed for rash.)   No facility-administered encounter medications on file as of 09/28/2021.   Recent Relevant Labs: Lab Results  Component Value Date/Time   HGBA1C 6.9 (H) 09/14/2021 01:18 PM   HGBA1C 6.9 (A) 04/20/2021 10:38 AM   HGBA1C 6.9 (H) 12/16/2020 09:18 AM   HGBA1C 6.7 12/03/2015 12:00 AM   MICROALBUR 123.0 05/12/2020 09:48 AM   MICROALBUR 232.3 05/04/2017 09:31 AM    Kidney Function Lab Results  Component Value Date/Time   CREATININE 1.69 (H) 09/21/2021 12:42 PM   CREATININE 2.07 (H) 09/14/2021 01:18 PM   GFRNONAA 40 (L) 03/15/2021 09:30 AM   GFRNONAA 34 (L) 03/03/2021 01:01 PM  GFRNONAA 35 (L) 05/12/2020 09:48 AM   GFRAA 37 (L) 12/16/2020 09:18 AM   GFRAA 41 (L) 05/12/2020 09:48 AM    Current antihyperglycemic regimen:  Lantus 100u/ml 30 units at bedtime  What recent interventions/DTPs have been made to improve glycemic control:  None.   Have there been any recent hospitalizations or ED visits since last visit with CPP? Patient stated no.   Patient denies hypoglycemic symptoms, including None  Patient denies hyperglycemic symptoms,  including none  How often are you checking your blood sugar? Patients son stated once daily  What are your blood sugars ranging?  Patients son stated his blood sugars are within normal limits.   During the week, how often does your blood glucose drop below 70? Patients son stated never.   Are you checking your feet daily/regularly?  Patients son reminded to monitor his fathers feet.   Adherence Review: Is the patient currently on a STATIN medication? Simvastatin 20 mg  Is the patient currently on ACE/ARB medication? Lisinopril 20 mg  Does the patient have >5 day gap between last estimated fill dates? Per misc rpts, yes.  Care Gaps:Patient is due for his foot exam(Per son Scheduled)  Star Rating Drugs:Lisinopril 20 mg 07/19/21 90 DS, Simvastatin 20 mg 05/17/21 90 DS.   Follow-Up:Pharmacist Review  Charlann Lange, [place] Pharmacist Assistant [phone]

## 2021-09-29 ENCOUNTER — Inpatient Hospital Stay: Payer: Medicare Other

## 2021-09-29 ENCOUNTER — Other Ambulatory Visit: Payer: Self-pay

## 2021-09-29 DIAGNOSIS — D472 Monoclonal gammopathy: Secondary | ICD-10-CM

## 2021-09-29 LAB — CMP (CANCER CENTER ONLY)
ALT: 8 U/L (ref 0–44)
AST: 13 U/L — ABNORMAL LOW (ref 15–41)
Albumin: 3.1 g/dL — ABNORMAL LOW (ref 3.5–5.0)
Alkaline Phosphatase: 141 U/L — ABNORMAL HIGH (ref 38–126)
Anion gap: 12 (ref 5–15)
BUN: 23 mg/dL (ref 8–23)
CO2: 20 mmol/L — ABNORMAL LOW (ref 22–32)
Calcium: 9.8 mg/dL (ref 8.9–10.3)
Chloride: 105 mmol/L (ref 98–111)
Creatinine: 1.78 mg/dL — ABNORMAL HIGH (ref 0.61–1.24)
GFR, Estimated: 40 mL/min — ABNORMAL LOW (ref >60)
Glucose, Bld: 163 mg/dL — ABNORMAL HIGH (ref 70–99)
Potassium: 3.8 mmol/L (ref 3.5–5.1)
Sodium: 137 mmol/L (ref 135–145)
Total Bilirubin: 0.2 mg/dL — ABNORMAL LOW (ref 0.3–1.2)
Total Protein: 7.9 g/dL (ref 6.5–8.1)

## 2021-09-29 LAB — CBC WITH DIFFERENTIAL (CANCER CENTER ONLY)
Abs Immature Granulocytes: 0.04 10*3/uL (ref 0.00–0.07)
Basophils Absolute: 0.1 10*3/uL (ref 0.0–0.1)
Basophils Relative: 1 %
Eosinophils Absolute: 0.5 10*3/uL (ref 0.0–0.5)
Eosinophils Relative: 6 %
HCT: 38.4 % — ABNORMAL LOW (ref 39.0–52.0)
Hemoglobin: 11.8 g/dL — ABNORMAL LOW (ref 13.0–17.0)
Immature Granulocytes: 1 %
Lymphocytes Relative: 17 %
Lymphs Abs: 1.4 10*3/uL (ref 0.7–4.0)
MCH: 22.5 pg — ABNORMAL LOW (ref 26.0–34.0)
MCHC: 30.7 g/dL (ref 30.0–36.0)
MCV: 73.1 fL — ABNORMAL LOW (ref 80.0–100.0)
Monocytes Absolute: 0.5 10*3/uL (ref 0.1–1.0)
Monocytes Relative: 6 %
Neutro Abs: 6.1 10*3/uL (ref 1.7–7.7)
Neutrophils Relative %: 69 %
Platelet Count: 260 10*3/uL (ref 150–400)
RBC: 5.25 MIL/uL (ref 4.22–5.81)
RDW: 17.8 % — ABNORMAL HIGH (ref 11.5–15.5)
WBC Count: 8.6 10*3/uL (ref 4.0–10.5)
nRBC: 0 % (ref 0.0–0.2)

## 2021-09-29 LAB — LACTATE DEHYDROGENASE: LDH: 160 U/L (ref 98–192)

## 2021-09-29 NOTE — Progress Notes (Deleted)
[facility] OFFICE PROGRESS NOTE  Jason Frizzle, MD [address] [place] [address]  DIAGNOSIS:  1) history of non-small cell lung cancer 2) MGUS  PRIOR THERAPY: radiation to the left lung on 05/30/2018 under the care of Dr. Isidore Moos.   CURRENT THERAPY: Observation   INTERVAL HISTORY: [name] 75 y.o. male returns to the clinic today for 64-monthfollow-up visit.  The patient was first seen in the clinic in March 2022 for MGUS.  He does have a history of non-small cell lung cancer diagnosed in 2019 status post radiation to the right lung for which she is followed by Dr. SIsidore Moos  His next appointment with Dr. SLanell Personsscheduled for ***.   The patient was last seen 6 months ago had a bone marrow biopsy and aspirate performed at that time which showed only 5% plasma cells.  Dr. MJulien Nordmannrecommended that the patient continue on observation.  Overall regarding the MGUS, the patient denies change in his fatigue.  Denies any recent signs and symptoms of infection, or abnormal bleeding.  Denies any lymphadenopathy, weight loss, early satiety, appetite change.  He has chronic constipation.  He has chronic abdominal pain for which he is prescribed Percocet.  Denies history of fracture.  Denies any headache, visual changes, or peripheral neuropathy.  The patient denies any shortness of breath, chest pain, palpitations, or headache or visual changes.  He continues to take his iron supplements.  The patient is here today interested few the results of his myeloma panel.     MEDICAL HISTORY: Past Medical History:  Diagnosis Date   Arthritis    Asthma    "hx of"   Barrett's esophagus    EGD 03/23/2011 & EGD 2/09 bx proven   BPH (benign prostatic hyperplasia)    [place]otid gland [place] 11/23[place]   Adenocarcinoma   Chronic abdominal pain    Chronic constipation    Colon polyp 03/23/2011   tubular adenoma, Dr. RGala Romney  [place] lesion of L2 level of lumbar spinal  cord [place] [place] 07/15/2011   CVA (cerebral infarction) 1998   right sided deficit   Delayed gastric emptying 2018   Diverticulosis    TCS 03/23/11 pancolonic diverticula &TCS 5/08, pancolonic diverticula   DM (diabetes mellitus) ([facility]    Edema of lower extremity 12/21/12   bilateral    Esotropia of left eye    GERD (gastroesophageal reflux disease)    Glaucoma (increased eye pressure)    Gout    Gout    Hemorrhagic colitis 06/06/2012.   Hemorrhoids, internal 03/23/2011   tcs by Dr. RGala Romney  Hepatitis    esosiniphilic, tx with prednisone   Hiatal hernia    History of radiation therapy 05/21/18- 05/30/18   Left Lung/ 54 Gy delivered in 3 fractions of 18 Gy. SBRT   HTN (hypertension)    Hx of radiation therapy 1974   right base of skull area-lymphoma   Hyperlipidemia    Lower facial weakness    Right   [place] [place]   [facility]   Neuropathy    [place] cell lung cancer [place] 04/26/18   Peripheral edema    R>L legs   Rash    chronic, recurrent, R>L legs   Renal insufficiency    Steatohepatitis    liver biopsy 2009   Stroke ([facility] 1998   right hemiparesis/plegia    ALLERGIES:  has No Known Allergies.  MEDICATIONS:  Current Outpatient Medications  Medication Sig Dispense Refill   allopurinol (ZYLOPRIM) 300 MG tablet TAKE 1 TABLET(300 MG) BY MOUTH DAILY 90 tablet 2   amLODipine (NORVASC) 10 MG tablet Take 1 tablet (10 mg total) by mouth daily. 90 tablet 2   cloNIDine (CATAPRES) 0.2 MG tablet Take 1 tablet (0.2 mg total) by mouth 3 (three) times daily. TAKE 1 TABLET(0.2 MG) BY MOUTH THREE TIMES DAILY 270 tablet 2   diclofenac sodium (VOLTAREN) 1 % GEL Apply twice a day for affected area on ankle as needed 100 g 1   fluticasone (FLONASE) 50 MCG/ACT nasal spray Place 2 sprays into both nostrils daily. 16 g 2   furosemide (LASIX) 80 MG tablet Take 1 tablet (80 mg total) by mouth daily. 30 tablet 3   gabapentin (NEURONTIN) 300 MG capsule Take 2  capsules (600 mg total) by mouth 2 (two) times daily. May also take 2 capsules (600 mg total) daily as needed. 540 capsule 3   glucose blood (ONETOUCH VERIO) test strip Use as directed to check Blood Glucose twice daily 150 strip 5   insulin glargine (LANTUS SOLOSTAR) 100 UNIT/ML Solostar Pen Inject 30 Units into the skin at bedtime. 15 mL 2   iron polysaccharides (NIFEREX) 150 MG capsule TAKE 1 CAPSULE(150 MG) BY MOUTH DAILY 90 capsule 2   Lancets (ONETOUCH DELICA PLUS OZDGUY40H) MISC USE TO TEST BLOOD SUGAR FOUR TIMES DAILY 200 each 2   lisinopril (ZESTRIL) 20 MG tablet TAKE 1 TABLET(20 MG) BY MOUTH DAILY 90 tablet 2   lubiprostone (AMITIZA) 24 MCG capsule TAKE 1 CAPSULE(24 MCG) BY MOUTH TWICE DAILY WITH A MEAL 90 capsule 2   metoprolol succinate (TOPROL-XL) 50 MG 24 hr tablet TAKE 1 TABLET BY MOUTH EVERY DAY WITH OR IMMEDIATELY FOLLOWING A MEAL 90 tablet 2   nystatin (MYCOSTATIN/NYSTOP) powder Apply topically 3 (three) times daily. (Patient taking differently: Apply topically as needed.) 60 g 2   oxyCODONE-acetaminophen (PERCOCET) 7.5-325 MG tablet Take 1 tablet by mouth every 4 (four) hours as needed. 180 tablet 0   pantoprazole (PROTONIX) 40 MG tablet TAKE 1 TABLET(40 MG) BY MOUTH DAILY 90 tablet 2   simvastatin (ZOCOR) 20 MG tablet TAKE 1 TABLET(20 MG) BY MOUTH AT BEDTIME 90 tablet 2   tamsulosin (FLOMAX) 0.4 MG CAPS capsule TAKE 1 CAPSULE(0.4 MG) BY MOUTH DAILY 90 capsule 2   tiZANidine (ZANAFLEX) 2 MG tablet TAKE 1 TABLET(2 MG) BY MOUTH TWICE DAILY AS NEEDED 180 tablet 2   Triamcinolone Acetonide (TRIAMCINOLONE 0.1 % CREAM : EUCERIN) CREA Apply 1 application topically 2 (two) times daily as needed. (Patient taking differently: Apply 1 application topically 2 (two) times daily as needed for rash.) 1 each 2   No current facility-administered medications for this visit.    SURGICAL HISTORY:  Past Surgical History:  Procedure Laterality Date   BIOPSY  12/01/2016   Procedure: BIOPSY;   Surgeon: Daneil Dolin, MD;  Location: [facility];  Service: Endoscopy;;  duodenum, gastric, esophagus   CHOLECYSTECTOMY     COLONOSCOPY  03/23/11   Dr. Gala Romney  pancolonic diverticula, hemorrhoids, tubular adenoma.. next tcs 03/2016   COLONOSCOPY WITH PROPOFOL N/A 12/01/2016   inadequate bowel prep precluded exam   ESOPHAGOGASTRODUODENOSCOPY  02/05/08   goblet cell metaplasia/negative for H.pylori   ESOPHAGOGASTRODUODENOSCOPY  03/23/11   Dr. Gala Romney, barretts, hiatal hernia   ESOPHAGOGASTRODUODENOSCOPY (EGD) WITH PROPOFOL N/A 12/01/2016   Dr. Gala Romney: Large amount of retained gastric contents precluded completion of the stomach.  Mucosal changes were found in the stomach. Erosions and somewhat scalloped appearing mucosa present, reactive gastritis/no H pylori. Barrett's esophagus noted, no dysplasia on biopsy. Duodenal biopsies taken as well, benign, no evidence of eosinophilia.   IR FLUORO GUIDED NEEDLE PLC ASPIRATION/INJECTION LOC  12/13/2018   IR KYPHO LUMBAR INC FX REDUCE BONE BX UNI/BIL CANNULATION INC/IMAGING  12/13/2018   IR RADIOLOGY PERIPHERAL GUIDED IV START  12/13/2018   IR US GUIDE VASC ACCESS LEFT  12/13/2018   MASS BIOPSY  11/01/2012   Procedure: NECK MASS BIOPSY;  Surgeon: Ascencion Dike, MD;  Location: [facility];  Service: ENT;  Laterality: Right;  Excisional Bx Right Neck Mass; attempted external jugular cutdown of left side   PAROTIDECTOMY  11/24/2012   Procedure: PAROTIDECTOMY;  Surgeon: Ascencion Dike, MD;  Location: [facility];  Service: ENT;  Laterality: N/A;  Total parotidectomy   PLEURECTOMY     right lymph node removal Right    behind right ear   Right video-assisted thoracic surgery, pleurectomy, and pleurodesis  2011   VIDEO BRONCHOSCOPY WITH ENDOBRONCHIAL NAVIGATION N/A 04/26/2018   Procedure: VIDEO BRONCHOSCOPY WITH ENDOBRONCHIAL NAVIGATION;  Surgeon: Melrose Nakayama, MD;  Location: [facility];  Service: Thoracic;  Laterality: N/A;    REVIEW OF SYSTEMS:   Review of Systems   Constitutional: Negative for appetite change, chills, fatigue, fever and unexpected weight change.  HENT:   Negative for mouth sores, nosebleeds, sore throat and trouble swallowing.   Eyes: Negative for eye problems and icterus.  Respiratory: Negative for cough, hemoptysis, shortness of breath and wheezing.   Cardiovascular: Negative for chest pain and leg swelling.  Gastrointestinal: Negative for abdominal pain, constipation, diarrhea, nausea and vomiting.  Genitourinary: Negative for bladder incontinence, difficulty urinating, dysuria, frequency and hematuria.   Musculoskeletal: Negative for back pain, gait problem, neck pain and neck stiffness.  Skin: Negative for itching and rash.  Neurological: Negative for dizziness, extremity weakness, gait problem, headaches, light-headedness and seizures.  Hematological: Negative for adenopathy. Does not bruise/bleed easily.  Psychiatric/Behavioral: Negative for confusion, depression and sleep disturbance. The patient is not nervous/anxious.     PHYSICAL EXAMINATION:  There were no vitals taken for this visit.  ECOG PERFORMANCE STATUS: {CHL ONC ECOG [id number]  Physical Exam  Constitutional: Oriented to person, place, and time and well-developed, well-nourished, and in no distress. No distress.  HENT:  Head: Normocephalic and atraumatic.  Mouth/Throat: Oropharynx is clear and moist. No oropharyngeal exudate.  Eyes: Conjunctivae are normal. Right eye exhibits no discharge. Left eye exhibits no discharge. No scleral icterus.  Neck: Normal range of motion. Neck supple.  Cardiovascular: Normal rate, regular rhythm, normal heart sounds and intact distal pulses.   Pulmonary/Chest: Effort normal and breath sounds normal. No respiratory distress. No wheezes. No rales.  Abdominal: Soft. Bowel sounds are normal. Exhibits no distension and no mass. There is no tenderness.  Musculoskeletal: Normal range of motion. Exhibits no edema.  Lymphadenopathy:     No cervical adenopathy.  Neurological: Alert and oriented to person, place, and time. Exhibits normal muscle tone. Gait normal. Coordination normal.  Skin: Skin is warm and dry. No rash noted. Not diaphoretic. No erythema. No pallor.  Psychiatric: Mood, memory and judgment normal.  Vitals reviewed.  LABORATORY DATA: Lab Results  Component Value Date   WBC 9.4 09/14/2021   HGB 12.1 (L) 09/14/2021   HCT 37.7 (L) 09/14/2021   MCV 72.8 (L) 09/14/2021   PLT 408 (H) 09/14/2021      Chemistry  Component Value Date/Time   NA 139 09/21/2021 1242   NA 143 12/16/2020 0918   K 4.4 09/21/2021 1242   K 4.3 08/16/2011 0957   CL 103 09/21/2021 1242   CO2 24 09/21/2021 1242   BUN 25 09/21/2021 1242   BUN 28 (H) 12/16/2020 0918   BUN 23 08/16/2011 0957   CREATININE 1.69 (H) 09/21/2021 1242      Component Value Date/Time   CALCIUM 9.7 09/21/2021 1242   CALCIUM 10.3 08/16/2011 0957   CALCIUM 11.4 (H) 08/12/2011 1349   ALKPHOS 115 03/03/2021 1301   ALKPHOS 80 08/16/2011 0957   AST 9 (L) 09/14/2021 1318   AST 9 (L) 03/03/2021 1301   ALT 7 (L) 09/14/2021 1318   ALT 10 03/03/2021 1301   BILITOT 0.4 09/14/2021 1318   BILITOT <0.2 (L) 03/03/2021 1301       RADIOGRAPHIC STUDIES:  No results found.   ASSESSMENT/PLAN:  This is a very pleasant 75 year old African-American male with a history of stage Ib non-small cell lung cancer status post SBRT under the care of Dr. Isidore Moos and he is followed by her for monitoring of his condition.  The patient was also found to have MGUS.  His protein studies showed elevated light chain biopsy and aspirate showed only 5% plasma cells.  The patient recently had a repeat myeloma panel performed.  The patient was seen with Dr. Julien Nordmann today.  Dr. Julien Nordmann personally independently reviewed his results and discussed results with the patient and his wife.  The the results showed ***  Dr. Julien Nordmann recommends continuing observation with a repeat myeloma  panel in 6 months.  We will see the patient back for follow-up visit at that time for evaluation and to review his results results.  When does he see Dr. Isidore Moos next?  The patient was advised to call immediately if he has any concerning symptoms in the interval. The patient voices understanding of current disease status and treatment options and is in agreement with the current care plan. All questions were answered. The patient knows to call the clinic with any problems, questions or concerns. We can certainly see the patient much sooner if necessary       No orders of the defined types were placed in this encounter.    I spent {CHL ONC TIME VISIT - RCVEL:[id number]} counseling the patient face to face. The total time spent in the appointment was {CHL ONC TIME VISIT - HENID:[id number]}.  Sharion Grieves L My [place], PA-C 09/29/21

## 2021-09-30 ENCOUNTER — Ambulatory Visit: Payer: Medicare Other | Admitting: Internal Medicine

## 2021-09-30 LAB — IGG, IGA, IGM
IgA: 275 mg/dL (ref 61–437)
IgG (Immunoglobin G), Serum: 1535 mg/dL (ref 603–1613)
IgM (Immunoglobulin M), Srm: 72 mg/dL (ref 15–143)

## 2021-09-30 LAB — KAPPA/LAMBDA LIGHT CHAINS
Kappa free light chain: 177 mg/L — ABNORMAL HIGH (ref 3.3–19.4)
Kappa, lambda light chain ratio: 3.36 — ABNORMAL HIGH (ref 0.26–1.65)
Lambda free light chains: 52.7 mg/L — ABNORMAL HIGH (ref 5.7–26.3)

## 2021-09-30 LAB — BETA 2 MICROGLOBULIN, SERUM: Beta-2 Microglobulin: 4.5 mg/L — ABNORMAL HIGH (ref 0.6–2.4)

## 2021-10-06 ENCOUNTER — Inpatient Hospital Stay: Payer: Medicare Other | Admitting: Physician Assistant

## 2021-10-06 ENCOUNTER — Telehealth: Payer: Self-pay | Admitting: Internal Medicine

## 2021-10-06 DIAGNOSIS — M7989 Other specified soft tissue disorders: Secondary | ICD-10-CM

## 2021-10-06 NOTE — Telephone Encounter (Signed)
Scheduled per sch msg. Called an left msg

## 2021-10-13 ENCOUNTER — Inpatient Hospital Stay: Payer: Medicare Other | Attending: Physician Assistant | Admitting: Physician Assistant

## 2021-10-13 ENCOUNTER — Telehealth: Payer: Self-pay | Admitting: Physician Assistant

## 2021-10-13 NOTE — Telephone Encounter (Signed)
Sch per 11/2 inbasket, called pt wife per 11/1 inbasket, left msg

## 2021-10-15 NOTE — Progress Notes (Deleted)
[place] OFFICE PROGRESS NOTE  Jason Frizzle, MD [address] [place] [address]  DIAGNOSIS: 1) history of non-small cell lung cancer 2) MGUS  PRIOR THERAPY: radiation to the left lung on 05/30/2018 under the care of Dr. Isidore Moos  CURRENT THERAPY: Observation   INTERVAL HISTORY: [name] 75 y.o. male returns to the clinic today for 40-monthfollow-up visit.  The patient was first seen in the clinic in March 2022 for MGUS.  He does have a history of non-small cell lung cancer diagnosed in 2019 status post radiation to the right lung for which she is followed by Dr. SIsidore Moos  His next appointment with Dr. SLanell Personsscheduled for ***.   The patient was last seen 6 months ago had a bone marrow biopsy and aspirate performed at that time which showed only 5% plasma cells.  Dr. MJulien Nordmannrecommended that the patient continue on observation.  Overall regarding the MGUS, the patient denies change in his fatigue.  Denies any recent signs and symptoms of infection, or abnormal bleeding.  Denies any lymphadenopathy, weight loss, early satiety, appetite change.  He has chronic constipation.  He has chronic abdominal pain for which he is prescribed Percocet.  Denies history of fracture.  Denies any headache, visual changes, or peripheral neuropathy.  The patient denies any shortness of breath, chest pain, palpitations, or headache or visual changes.  He continues to take his iron supplements.  The patient is here today interested few the results of his myeloma panel.  MEDICAL HISTORY: Past Medical History:  Diagnosis Date   Arthritis    Asthma    "hx of"   Barrett's esophagus    EGD 03/23/2011 & EGD 2/09 bx proven   BPH (benign prostatic hyperplasia)    [place]id gland [place] 11/23[place]   Adenocarcinoma   Chronic abdominal pain    Chronic constipation    Colon polyp 03/23/2011   tubular adenoma, Dr. RGala Romney  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 [place]  07/15/2011   CVA (cerebral infarction) 1998   right sided deficit   Delayed gastric emptying 2018   Diverticulosis    TCS 03/23/11 pancolonic diverticula &TCS 5/08, pancolonic diverticula   [place] mellitus[place]    Edema of lower extremity 12/21/12   bilateral    Esotropia of left eye    GERD (gastroesophageal reflux disease)    Glaucoma (increased eye pressure)    Gout    Gout    Hemorrhagic colitis 06/06/2012.   Hemorrhoids, internal 03/23/2011   tcs by Dr. RGala Romney  Hepatitis    esosiniphilic, tx with prednisone   Hiatal hernia    History of radiation therapy 05/21/18- 05/30/18   Left Lung/ 54 Gy delivered in 3 fractions of 18 Gy. SBRT   HTN (hypertension)    Hx of radiation therapy 1974   right base of skull area-lymphoma   Hyperlipidemia    Lower facial weakness    Right   [place]oma [facility]   [facility]   Neuropathy    Non-small cell lung cancer ([place] dx'd 04/26/18   Peripheral edema    R>L legs   Rash    chronic, recurrent, R>L legs   Renal insufficiency    Steatohepatitis    liver biopsy 2009   St[facility]   right hemiparesis/plegia    ALLERGIES:  has No Known Allergies.  MEDICATIONS:  Current Outpatient  Medications  Medication Sig Dispense Refill   allopurinol (ZYLOPRIM) 300 MG tablet TAKE 1 TABLET(300 MG) BY MOUTH DAILY 90 tablet 2   amLODipine (NORVASC) 10 MG tablet Take 1 tablet (10 mg total) by mouth daily. 90 tablet 2   cloNIDine (CATAPRES) 0.2 MG tablet Take 1 tablet (0.2 mg total) by mouth 3 (three) times daily. TAKE 1 TABLET(0.2 MG) BY MOUTH THREE TIMES DAILY 270 tablet 2   diclofenac sodium (VOLTAREN) 1 % GEL Apply twice a day for affected area on ankle as needed 100 g 1   fluticasone (FLONASE) 50 MCG/ACT nasal spray Place 2 sprays into both nostrils daily. 16 g 2   furosemide (LASIX) 80 MG tablet Take 1 tablet (80 mg total) by mouth daily. 30 tablet 3   gabapentin (NEURONTIN) 300 MG capsule Take 2 capsules (600 mg  total) by mouth 2 (two) times daily. May also take 2 capsules (600 mg total) daily as needed. 540 capsule 3   glucose blood (ONETOUCH VERIO) test strip Use as directed to check Blood Glucose twice daily 150 strip 5   insulin glargine (LANTUS SOLOSTAR) 100 UNIT/ML Solostar Pen Inject 30 Units into the skin at bedtime. 15 mL 2   iron polysaccharides (NIFEREX) 150 MG capsule TAKE 1 CAPSULE(150 MG) BY MOUTH DAILY 90 capsule 2   Lancets (ONETOUCH DELICA PLUS [id number]) MISC USE TO TEST BLOOD SUGAR FOUR TIMES DAILY 200 each 2   lisinopril (ZESTRIL) 20 MG tablet TAKE 1 TABLET(20 MG) BY MOUTH DAILY 90 tablet 2   lubiprostone (AMITIZA) 24 MCG capsule TAKE 1 CAPSULE(24 MCG) BY MOUTH TWICE DAILY WITH A MEAL 90 capsule 2   metoprolol succinate (TOPROL-XL) 50 MG 24 hr tablet TAKE 1 TABLET BY MOUTH EVERY DAY WITH OR IMMEDIATELY FOLLOWING A MEAL 90 tablet 2   nystatin (MYCOSTATIN/NYSTOP) powder Apply topically 3 (three) times daily. (Patient taking differently: Apply topically as needed.) 60 g 2   oxyCODONE-acetaminophen (PERCOCET) 7.5-325 MG tablet Take 1 tablet by mouth every 4 (four) hours as needed. 180 tablet 0   pantoprazole (PROTONIX) 40 MG tablet TAKE 1 TABLET(40 MG) BY MOUTH DAILY 90 tablet 2   simvastatin (ZOCOR) 20 MG tablet TAKE 1 TABLET(20 MG) BY MOUTH AT BEDTIME 90 tablet 2   tamsulosin (FLOMAX) 0.4 MG CAPS capsule TAKE 1 CAPSULE(0.4 MG) BY MOUTH DAILY 90 capsule 2   tiZANidine (ZANAFLEX) 2 MG tablet TAKE 1 TABLET(2 MG) BY MOUTH TWICE DAILY AS NEEDED 180 tablet 2   Triamcinolone Acetonide (TRIAMCINOLONE 0.1 % CREAM : EUCERIN) CREA Apply 1 application topically 2 (two) times daily as needed. (Patient taking differently: Apply 1 application topically 2 (two) times daily as needed for rash.) 1 each 2   No current facility-administered medications for this visit.    SURGICAL HISTORY:  Past Surgical History:  Procedure Laterality Date   BIOPSY  12/01/2016   Procedure: BIOPSY;  Surgeon: Daneil Dolin, MD;  Location: [facility];  Service: Endoscopy;;  duodenum, gastric, esophagus   CHOLECYSTECTOMY     COLONOSCOPY  03/23/11   Dr. Gala Romney  pancolonic diverticula, hemorrhoids, tubular adenoma.. next tcs 03/2016   COLONOSCOPY WITH PROPOFOL N/A 12/01/2016   inadequate bowel prep precluded exam   ESOPHAGOGASTRODUODENOSCOPY  02/05/08   goblet cell metaplasia/negative for H.pylori   ESOPHAGOGASTRODUODENOSCOPY  03/23/11   Dr. Gala Romney, barretts, hiatal hernia   ESOPHAGOGASTRODUODENOSCOPY (EGD) WITH PROPOFOL N/A 12/01/2016   Dr. Gala Romney: Large amount of retained gastric contents precluded completion of the stomach. Mucosal changes were found  in the stomach. Erosions and somewhat scalloped appearing mucosa present, reactive gastritis/no H pylori. Barrett's esophagus noted, no dysplasia on biopsy. Duodenal biopsies taken as well, benign, no evidence of eosinophilia.   IR FLUORO GUIDED NEEDLE PLC ASPIRATION/INJECTION LOC  12/13/2018   IR KYPHO LUMBAR INC FX REDUCE BONE BX UNI/BIL CANNULATION INC/IMAGING  12/13/2018   IR RADIOLOGY PERIPHERAL GUIDED IV START  12/13/2018   IR US GUIDE VASC ACCESS LEFT  12/13/2018   MASS BIOPSY  11/01/2012   Procedure: NECK MASS BIOPSY;  Surgeon: Ascencion Dike, MD;  Location: [facility];  Service: ENT;  Laterality: Right;  Excisional Bx Right Neck Mass; attempted external jugular cutdown of left side   PAROTIDECTOMY  11/24/2012   Procedure: PAROTIDECTOMY;  Surgeon: Ascencion Dike, MD;  Location: [facility];  Service: ENT;  Laterality: N/A;  Total parotidectomy   PLEURECTOMY     right lymph node removal Right    behind right ear   Right video-assisted thoracic surgery, pleurectomy, and pleurodesis  2011   VIDEO BRONCHOSCOPY WITH ENDOBRONCHIAL NAVIGATION N/A 04/26/2018   Procedure: VIDEO BRONCHOSCOPY WITH ENDOBRONCHIAL NAVIGATION;  Surgeon: Melrose Nakayama, MD;  Location: [facility];  Service: Thoracic;  Laterality: N/A;    REVIEW OF SYSTEMS:   Review of Systems  Constitutional: Negative for  appetite change, chills, fatigue, fever and unexpected weight change.  HENT:   Negative for mouth sores, nosebleeds, sore throat and trouble swallowing.   Eyes: Negative for eye problems and icterus.  Respiratory: Negative for cough, hemoptysis, shortness of breath and wheezing.   Cardiovascular: Negative for chest pain and leg swelling.  Gastrointestinal: Negative for abdominal pain, constipation, diarrhea, nausea and vomiting.  Genitourinary: Negative for bladder incontinence, difficulty urinating, dysuria, frequency and hematuria.   Musculoskeletal: Negative for back pain, gait problem, neck pain and neck stiffness.  Skin: Negative for itching and rash.  Neurological: Negative for dizziness, extremity weakness, gait problem, headaches, light-headedness and seizures.  Hematological: Negative for adenopathy. Does not bruise/bleed easily.  Psychiatric/Behavioral: Negative for confusion, depression and sleep disturbance. The patient is not nervous/anxious.     PHYSICAL EXAMINATION:  There were no vitals taken for this visit.  ECOG PERFORMANCE STATUS: {CHL ONC ECOG [id number]  Physical Exam  Constitutional: Oriented to person, place, and time and well-developed, well-nourished, and in no distress. No distress.  HENT:  Head: Normocephalic and atraumatic.  Mouth/Throat: Oropharynx is clear and moist. No oropharyngeal exudate.  Eyes: Conjunctivae are normal. Right eye exhibits no discharge. Left eye exhibits no discharge. No scleral icterus.  Neck: Normal range of motion. Neck supple.  Cardiovascular: Normal rate, regular rhythm, normal heart sounds and intact distal pulses.   Pulmonary/Chest: Effort normal and breath sounds normal. No respiratory distress. No wheezes. No rales.  Abdominal: Soft. Bowel sounds are normal. Exhibits no distension and no mass. There is no tenderness.  Musculoskeletal: Normal range of motion. Exhibits no edema.  Lymphadenopathy:    No cervical adenopathy.   Neurological: Alert and oriented to person, place, and time. Exhibits normal muscle tone. Gait normal. Coordination normal.  Skin: Skin is warm and dry. No rash noted. Not diaphoretic. No erythema. No pallor.  Psychiatric: Mood, memory and judgment normal.  Vitals reviewed.  LABORATORY DATA: Lab Results  Component Value Date   WBC 8.6 09/29/2021   HGB 11.8 (L) 09/29/2021   HCT 38.4 (L) 09/29/2021   MCV 73.1 (L) 09/29/2021   PLT 260 09/29/2021      Chemistry      Component  Value Date/Time   NA 137 09/29/2021 1500   NA 143 12/16/2020 0918   K 3.8 09/29/2021 1500   K 4.3 08/16/2011 0957   CL 105 09/29/2021 1500   CO2 20 (L) 09/29/2021 1500   BUN 23 09/29/2021 1500   BUN 28 (H) 12/16/2020 0918   BUN 23 08/16/2011 0957   CREATININE 1.78 (H) 09/29/2021 1500   CREATININE 1.69 (H) 09/21/2021 1242      Component Value Date/Time   CALCIUM 9.8 09/29/2021 1500   CALCIUM 10.3 08/16/2011 0957   CALCIUM 11.4 (H) 08/12/2011 1349   ALKPHOS 141 (H) 09/29/2021 1500   ALKPHOS 80 08/16/2011 0957   AST 13 (L) 09/29/2021 1500   ALT 8 09/29/2021 1500   BILITOT 0.2 (L) 09/29/2021 1500       RADIOGRAPHIC STUDIES:  No results found.   ASSESSMENT/PLAN:  This is a very pleasant 75 year old African-American male with a history of stage Ib non-small cell lung cancer status post SBRT under the care of Dr. Isidore Moos and he is followed by her for monitoring of his condition.  The patient was also found to have MGUS.  His protein studies showed elevated light chain biopsy and aspirate showed only 5% plasma cells.  The patient recently had a repeat myeloma panel performed.  The patient was seen with Dr. Julien Nordmann today.  Dr. Julien Nordmann personally independently reviewed his results and discussed results with the patient and his wife.  The the results showed ***  Dr. Julien Nordmann recommends continuing observation with a repeat myeloma panel in 6 months.  We will see the patient back for follow-up visit at that  time for evaluation and to review his results results.  When does he see Dr. Isidore Moos next?  The patient was advised to call immediately if he has any concerning symptoms in the interval. The patient voices understanding of current disease status and treatment options and is in agreement with the current care plan. All questions were answered. The patient knows to call the clinic with any problems, questions or concerns. We can certainly see the patient much sooner if necessary      No orders of the defined types were placed in this encounter.    I spent {CHL ONC TIME VISIT - PFXTK:[id number]} counseling the patient face to face. The total time spent in the appointment was {CHL ONC TIME VISIT - JMEQA:[id number]}.  Nilan Iddings L Liliana Brentlinger, PA-C 10/15/21

## 2021-10-16 DIAGNOSIS — I69959 Hemiplegia and hemiparesis following unspecified cerebrovascular disease affecting unspecified side: Secondary | ICD-10-CM | POA: Diagnosis not present

## 2021-10-16 DIAGNOSIS — R600 Localized edema: Secondary | ICD-10-CM | POA: Diagnosis not present

## 2021-10-19 ENCOUNTER — Inpatient Hospital Stay: Payer: Medicare Other | Admitting: Physician Assistant

## 2021-10-20 ENCOUNTER — Other Ambulatory Visit: Payer: Self-pay | Admitting: Physician Assistant

## 2021-10-20 NOTE — Progress Notes (Signed)
The patient has missed 5 appointments in the past month. We will not reschedule appointments at this time. I have sent a letter in the mail to the patient reminding him of our no show policy. If he wishes to reschedule his appointment, advised to call our scheduling team back to rescheduled.

## 2021-10-30 IMAGING — CT CT BIOPSY
1 of 2 series · 15 of 30 positions shown, 19 images · non-contrast
Comparison: none

INDICATION: Concern for multiple myeloma, abnormal lab values

[Series 4: i-spiral 5.0 br40 · axial · 0.98mm/px · z∈[-174,-108]mm · 15 of 23 slices shown, 19 images]
[im 2/23  mediastinal]
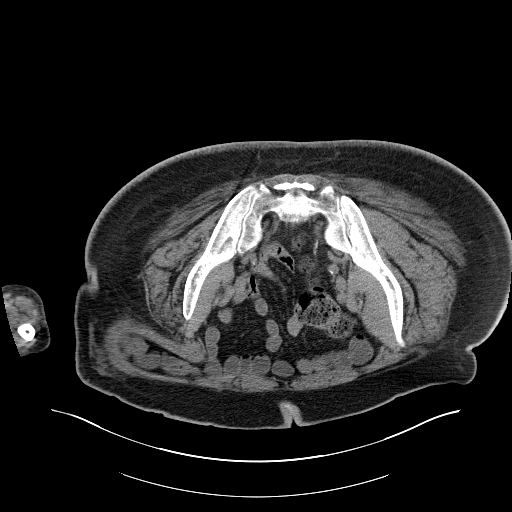
[im 2/23  lung]
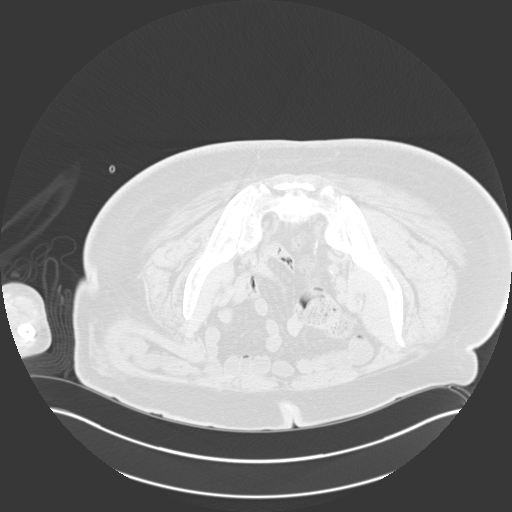
[im 4/23  lung]
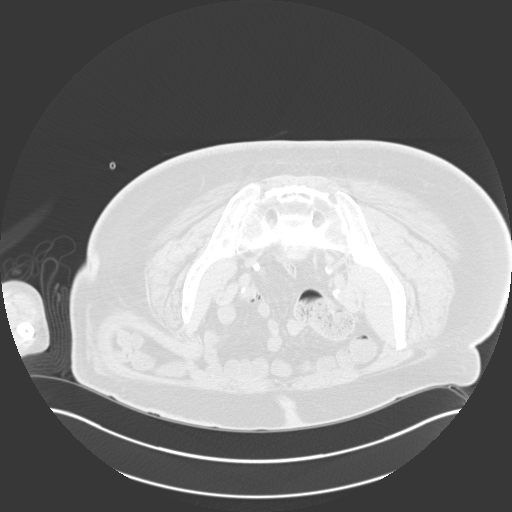
[im 5/23  lung]
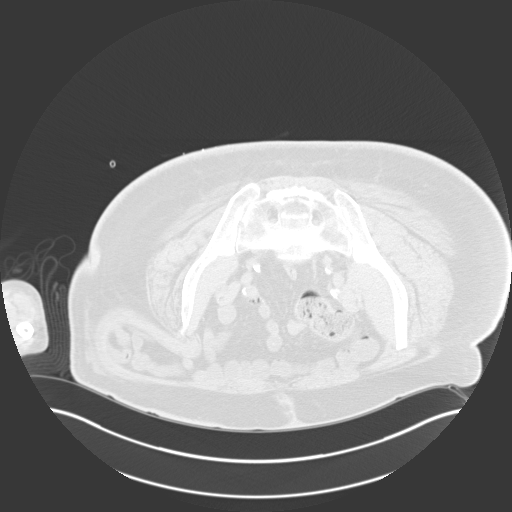
[im 6/23  lung]
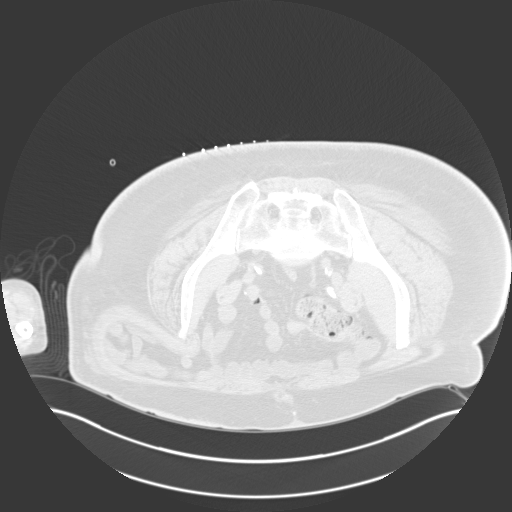
[im 8/23  mediastinal]
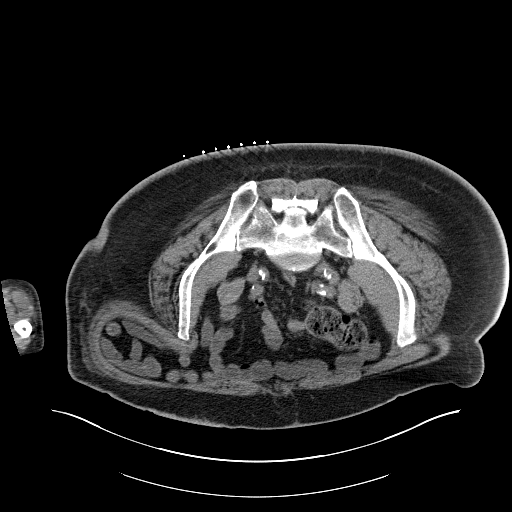
[im 8/23  lung]
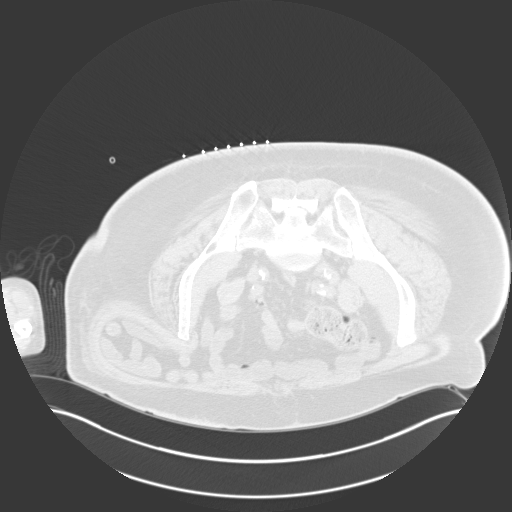
[im 9/23  lung]
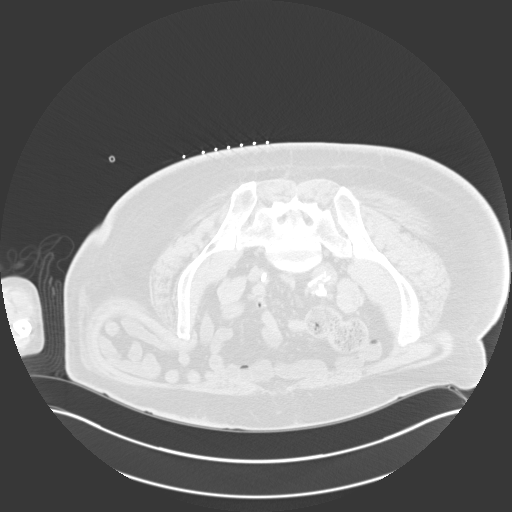
[im 10/23  lung]
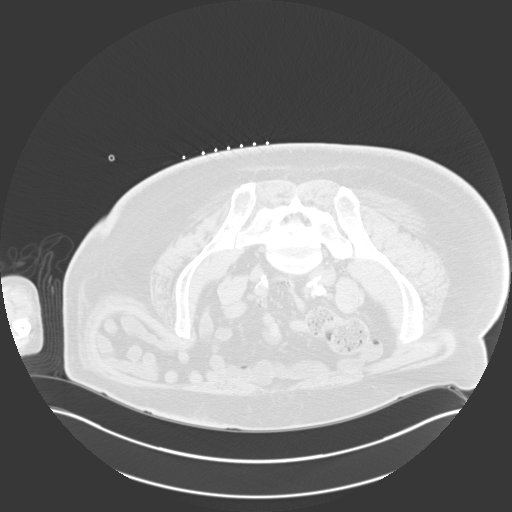
[im 12/23  lung]
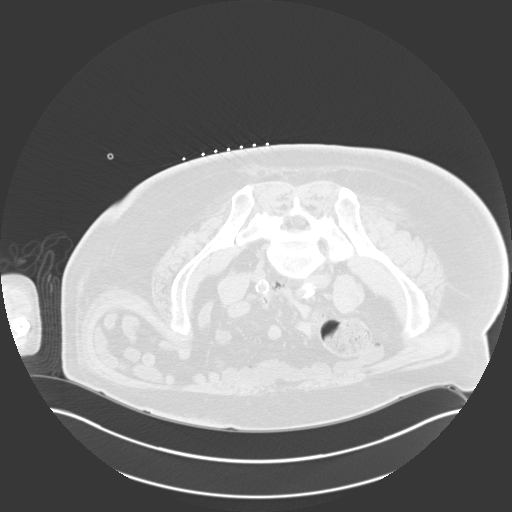
[im 13/23  mediastinal]
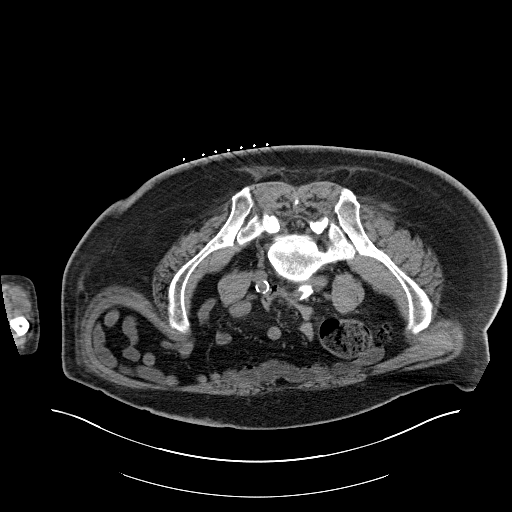
[im 13/23  lung]
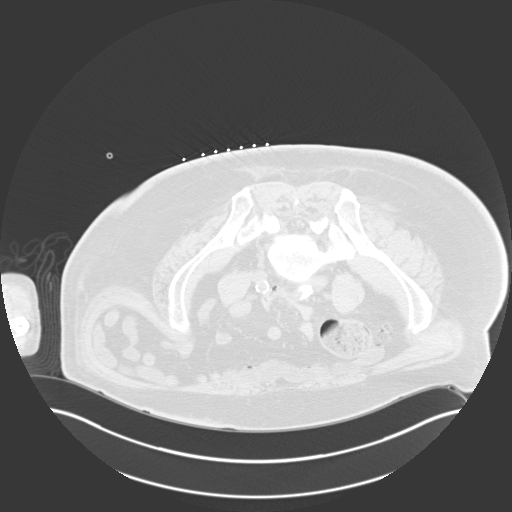
[im 14/23  lung]
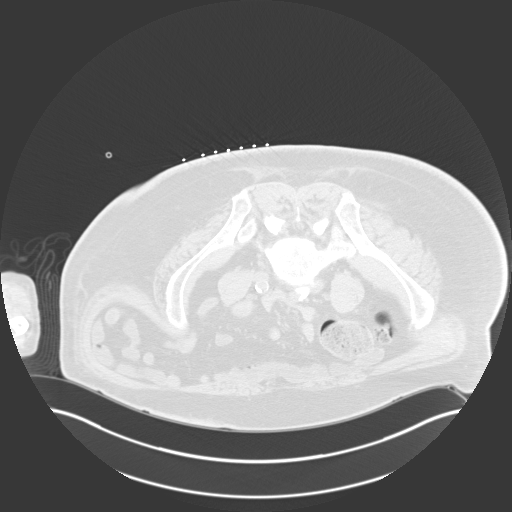
[im 16/23  lung]
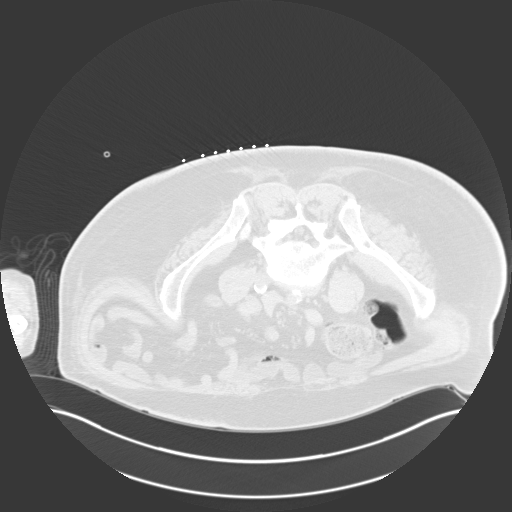
[im 17/23  lung]
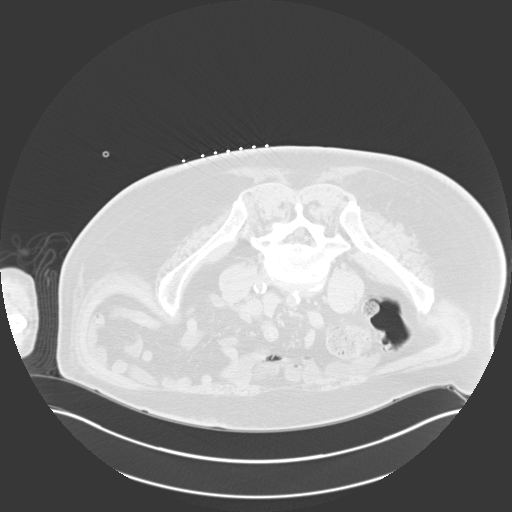
[im 18/23  mediastinal]
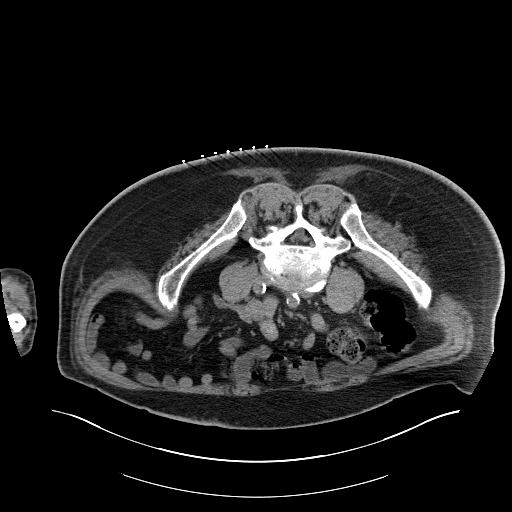
[im 18/23  lung]
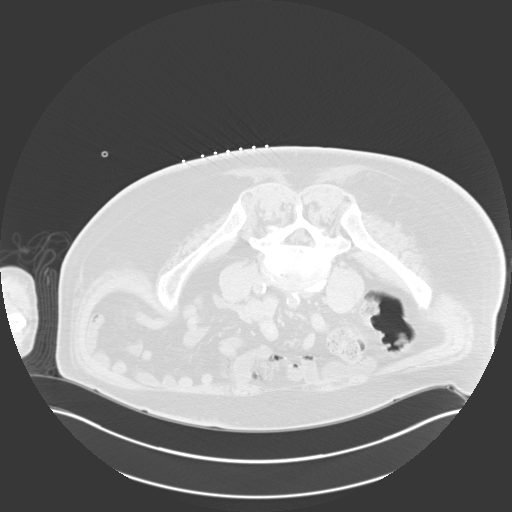
[im 20/23  lung]
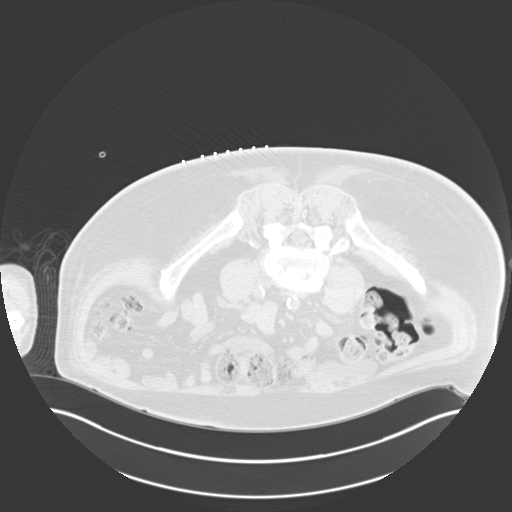
[im 21/23  lung]
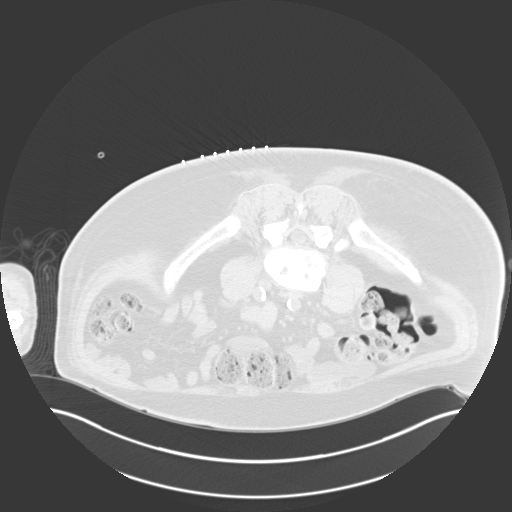

[15 of 30 positions shown; findings below may reference images not displayed]

EXAM:
CT GUIDED RIGHT ILIAC BONE MARROW ASPIRATION AND CORE BIOPSY

Radiologist:  Zainullin, Temorius

Guidance:  CT

FLUOROSCOPY TIME:  Fluoroscopy Time: None.

MEDICATIONS:
1% lidocaine local

ANESTHESIA/SEDATION:
2.0 mg IV Versed; 50 mcg IV Fentanyl

Moderate Sedation Time:  17 minutes

The patient was continuously monitored during the procedure by the
interventional radiology nurse under my direct supervision.

CONTRAST:  None.

COMPLICATIONS:
None

PROCEDURE:
Informed consent was obtained from the patient following explanation
of the procedure, risks, benefits and alternatives. The patient
understands, agrees and consents for the procedure. All questions
were addressed. A time out was performed.

The patient was positioned prone and non-contrast localization CT
was performed of the pelvis to demonstrate the iliac marrow spaces.

Maximal barrier sterile technique utilized including caps, mask,
sterile gowns, sterile gloves, large sterile drape, hand hygiene,
and Betadine prep.

Under sterile conditions and local anesthesia, an 11 gauge coaxial
bone biopsy needle was advanced into the right iliac marrow space.
Needle position was confirmed with CT imaging. Initially, bone
marrow aspiration was performed. Next, the 11 gauge outer cannula
was utilized to obtain a right iliac bone marrow core biopsy. Needle
was removed. Hemostasis was obtained with compression. The patient
tolerated the procedure well. Samples were prepared with the
cytotechnologist. No immediate complications.
IMPRESSION: CT guided right iliac bone marrow aspiration and core biopsy.

## 2021-10-30 IMAGING — CT CT BIOPSY AND ASPIRATION BONE MARROW
1 of 2 series · 15 of 30 positions shown, 19 images · non-contrast
Comparison: none

INDICATION: Concern for multiple myeloma, abnormal lab values

[Series 4: i-spiral 5.0 br40 · axial · 0.98mm/px · z∈[-174,-108]mm · 15 of 23 slices shown, 19 images]
[im 2/23  mediastinal]
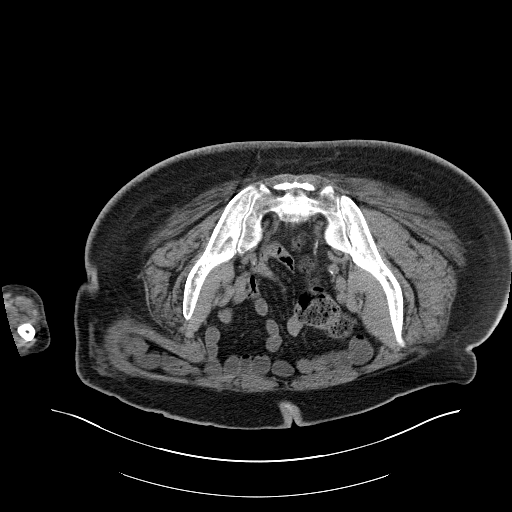
[im 2/23  lung]
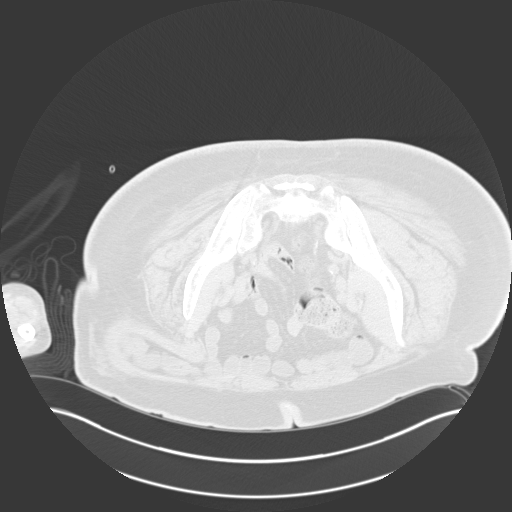
[im 4/23  lung]
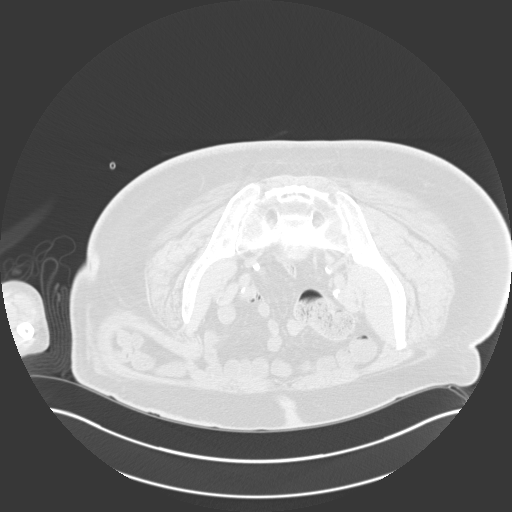
[im 5/23  lung]
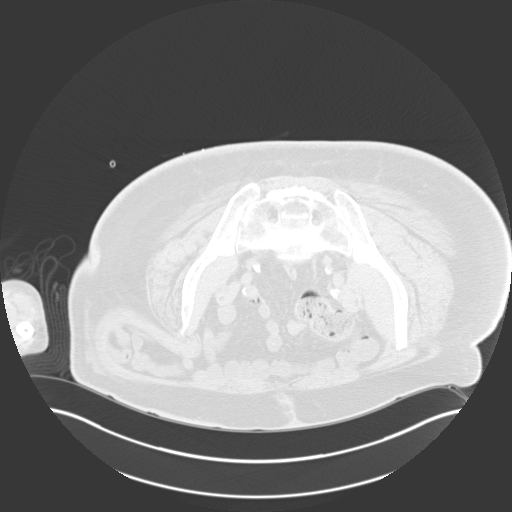
[im 6/23  lung]
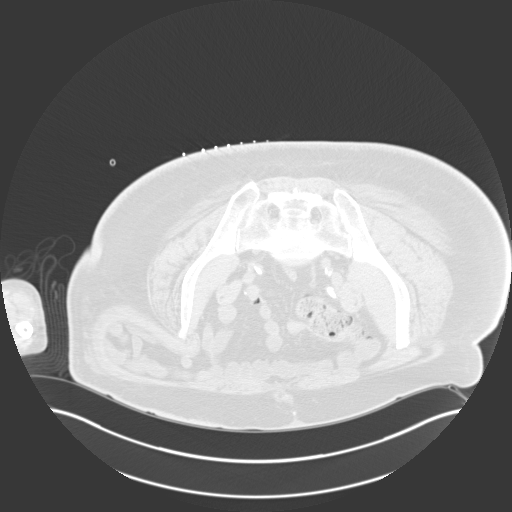
[im 8/23  mediastinal]
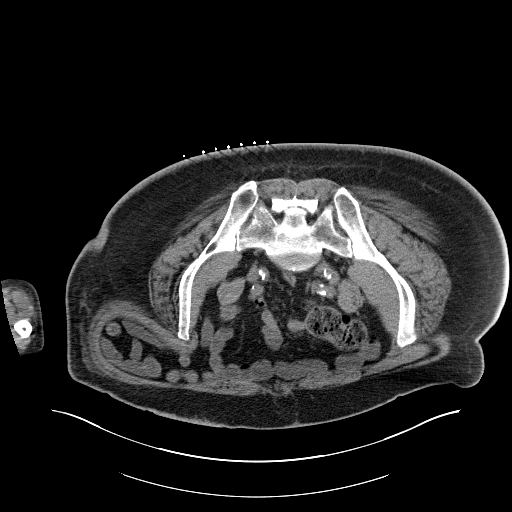
[im 8/23  lung]
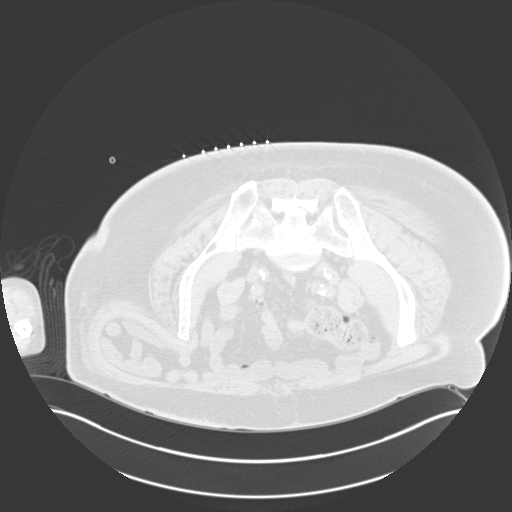
[im 9/23  lung]
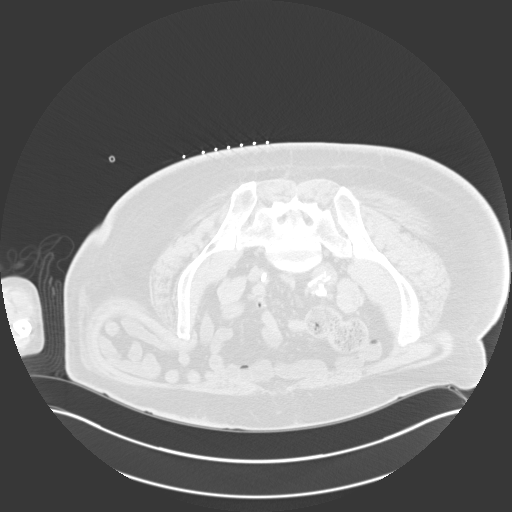
[im 10/23  lung]
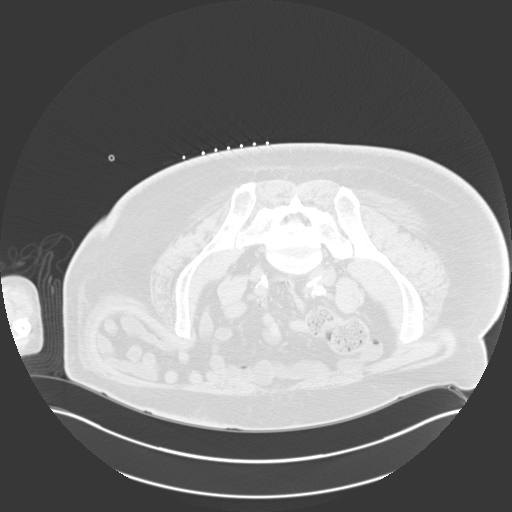
[im 12/23  lung]
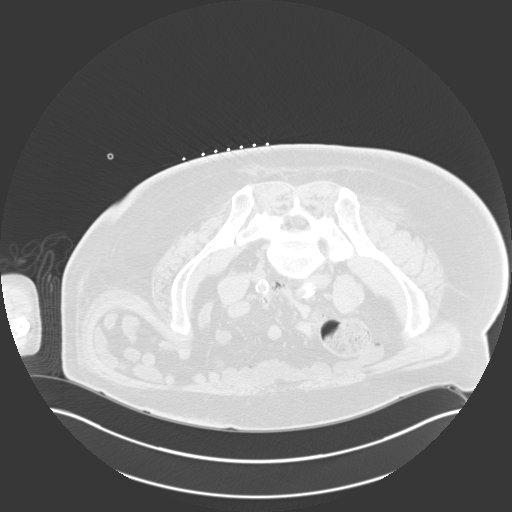
[im 13/23  mediastinal]
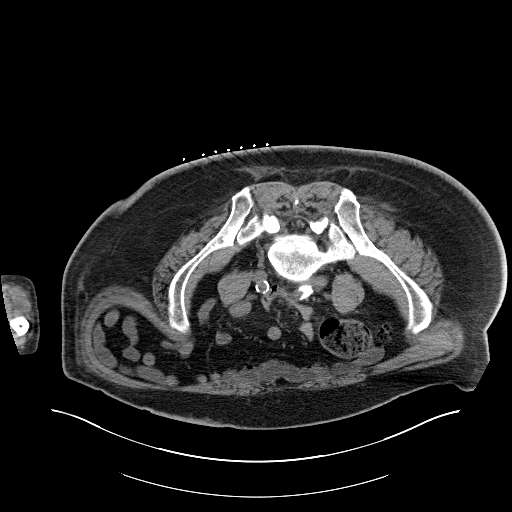
[im 13/23  lung]
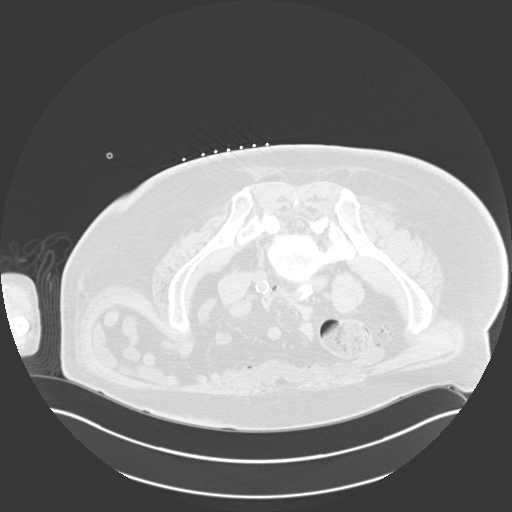
[im 14/23  lung]
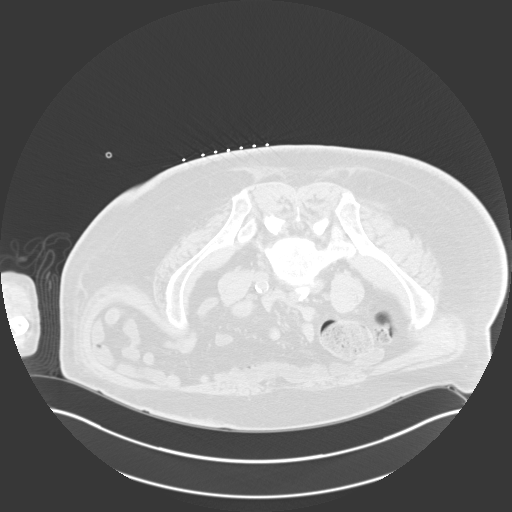
[im 16/23  lung]
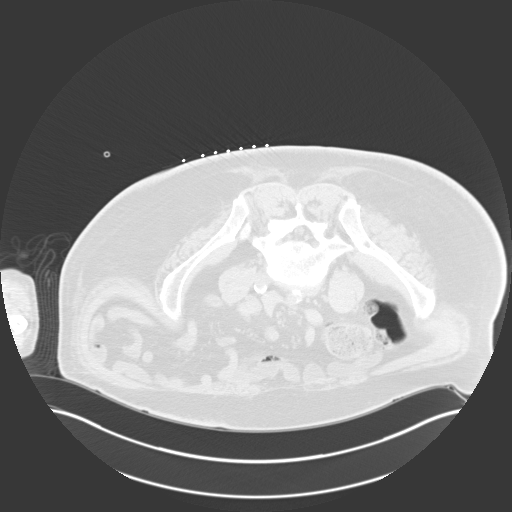
[im 17/23  lung]
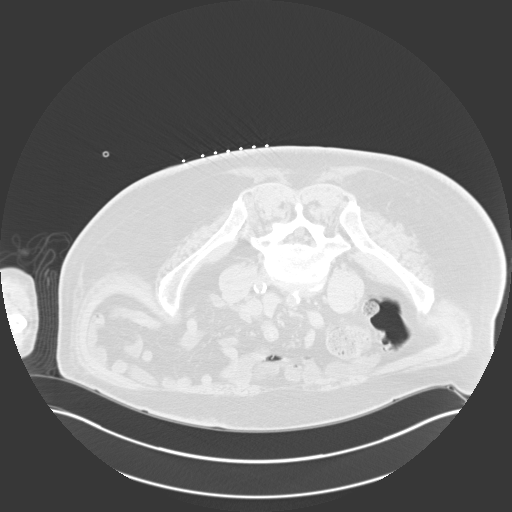
[im 18/23  mediastinal]
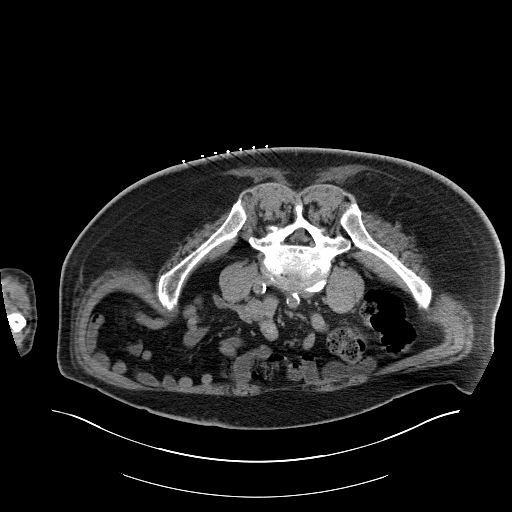
[im 18/23  lung]
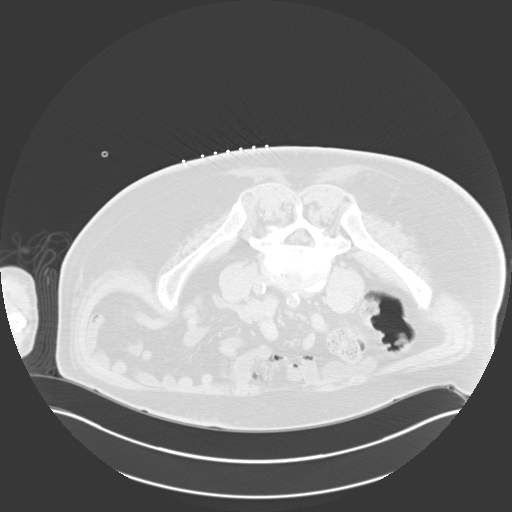
[im 20/23  lung]
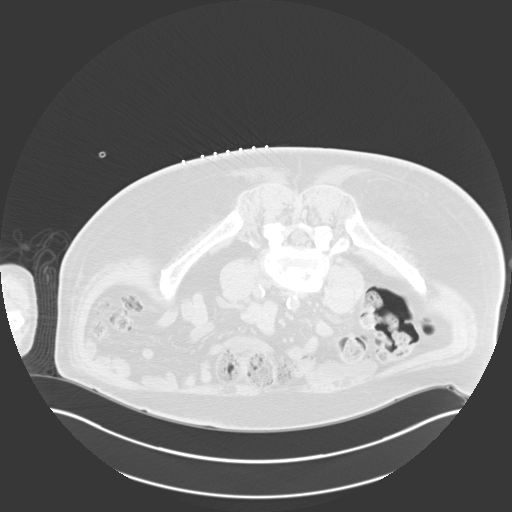
[im 21/23  lung]
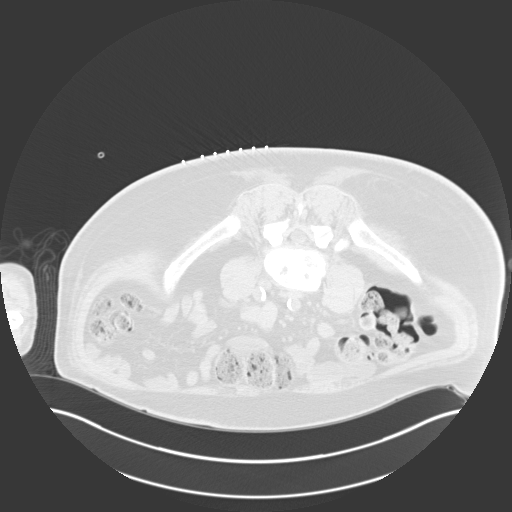

[15 of 30 positions shown; findings below may reference images not displayed]

EXAM:
CT GUIDED RIGHT ILIAC BONE MARROW ASPIRATION AND CORE BIOPSY

Radiologist:  Zainullin, Temorius

Guidance:  CT

FLUOROSCOPY TIME:  Fluoroscopy Time: None.

MEDICATIONS:
1% lidocaine local

ANESTHESIA/SEDATION:
2.0 mg IV Versed; 50 mcg IV Fentanyl

Moderate Sedation Time:  17 minutes

The patient was continuously monitored during the procedure by the
interventional radiology nurse under my direct supervision.

CONTRAST:  None.

COMPLICATIONS:
None

PROCEDURE:
Informed consent was obtained from the patient following explanation
of the procedure, risks, benefits and alternatives. The patient
understands, agrees and consents for the procedure. All questions
were addressed. A time out was performed.

The patient was positioned prone and non-contrast localization CT
was performed of the pelvis to demonstrate the iliac marrow spaces.

Maximal barrier sterile technique utilized including caps, mask,
sterile gowns, sterile gloves, large sterile drape, hand hygiene,
and Betadine prep.

Under sterile conditions and local anesthesia, an 11 gauge coaxial
bone biopsy needle was advanced into the right iliac marrow space.
Needle position was confirmed with CT imaging. Initially, bone
marrow aspiration was performed. Next, the 11 gauge outer cannula
was utilized to obtain a right iliac bone marrow core biopsy. Needle
was removed. Hemostasis was obtained with compression. The patient
tolerated the procedure well. Samples were prepared with the
cytotechnologist. No immediate complications.
IMPRESSION: CT guided right iliac bone marrow aspiration and core biopsy.

## 2021-11-09 ENCOUNTER — Telehealth: Payer: Self-pay | Admitting: *Deleted

## 2021-11-09 NOTE — Telephone Encounter (Signed)
CALLED PATIENT'S SON- KEVIN TO INFORM OF CT FOR 11-16-21- ARRIVAL TIME- 4:15 PM @ [facility], NO RESTRICTIONS TO TEST, PATIENT TO RECEIVE RESULTS FROM DR. SQUIRE ON 11-19-21 @ 3:20 PM, SPOKE WITH PATIENT'S SON - KEVIN AND HE IS AWARE OF THESE APPTS.

## 2021-11-15 DIAGNOSIS — R600 Localized edema: Secondary | ICD-10-CM | POA: Diagnosis not present

## 2021-11-15 DIAGNOSIS — I69959 Hemiplegia and hemiparesis following unspecified cerebrovascular disease affecting unspecified side: Secondary | ICD-10-CM | POA: Diagnosis not present

## 2021-11-16 ENCOUNTER — Ambulatory Visit (HOSPITAL_COMMUNITY): Payer: Medicare Other

## 2021-11-16 ENCOUNTER — Telehealth: Payer: Self-pay | Admitting: *Deleted

## 2021-11-16 NOTE — Telephone Encounter (Signed)
Called patient to inform of CT for 11-24-21- arrival time- 4:15 pm @ [facility], no restrictions to test, patient to fu with Dr. Isidore Moos for results on 11-26-21 @ 10:20 am, lvm for a return call

## 2021-11-19 ENCOUNTER — Ambulatory Visit: Payer: Self-pay | Admitting: Radiation Oncology

## 2021-11-24 ENCOUNTER — Ambulatory Visit (HOSPITAL_COMMUNITY): Payer: Medicare Other

## 2021-11-25 ENCOUNTER — Telehealth: Payer: Self-pay | Admitting: *Deleted

## 2021-11-25 NOTE — Progress Notes (Deleted)
Chronic Care Management Pharmacy Note  12/02/2021 Name:  [name] MRN:  [id number] DOB:  Feb 20, 1946  Subjective: [name] is an 75 y.o. year old male who is a primary patient of Pickard, Cammie Mcgee, MD.  The CCM team was consulted for assistance with disease management and care coordination needs.    Engaged with patient by telephone for follow up visit in response to provider referral for pharmacy case management and/or care coordination services.   Consent to Services:  The patient was given the following information about Chronic Care Management services today, agreed to services, and gave verbal consent: 1. CCM service includes personalized support from designated clinical staff supervised by the primary care provider, including individualized plan of care and coordination with other care providers 2. 24/7 contact phone numbers for assistance for urgent and routine care needs. 3. Service will only be billed when office clinical staff spend 20 minutes or more in a month to coordinate care. 4. Only one practitioner may furnish and bill the service in a calendar month. 5.The patient may stop CCM services at any time (effective at the end of the month) by phone call to the office staff. 6. The patient will be responsible for cost sharing (co-pay) of up to 20% of the service fee (after annual deductible is met). Patient agreed to services and consent obtained.  Patient Care Team: [name], MD as PCP - General (Family Medicine) Gala Romney Cristopher Estimable, MD (Gastroenterology) Edythe Clarity, [facility] as Pharmacist (Pharmacist) Carson Myrtle, Maree Erie, LCSW as Social Worker (Licensed Clinical Social Worker)  Recent office visits:  09/21/21 Dr. Dennard Schaumann For leg swelling. No medication changes.  09/07/21 Dr. Dennard Schaumann For leg swelling. Per note:Increase Lasix to 80 mg a day   Recent consult visits:  None since    Hospital visits:  None since   Objective:  Lab Results  Component Value  Date   CREATININE 1.78 (H) 09/29/2021   BUN 23 09/29/2021   GFRNONAA 40 (L) 09/29/2021   GFRAA 37 (L) 12/16/2020   NA 137 09/29/2021   K 3.8 09/29/2021   CALCIUM 9.8 09/29/2021   CO2 20 (L) 09/29/2021   GLUCOSE 163 (H) 09/29/2021    Lab Results  Component Value Date/Time   HGBA1C 6.9 (H) 09/14/2021 01:18 PM   HGBA1C 6.9 (A) 04/20/2021 10:38 AM   HGBA1C 6.9 (H) 12/16/2020 09:18 AM   HGBA1C 6.7 12/03/2015 12:00 AM   MICROALBUR 123.0 05/12/2020 09:48 AM   MICROALBUR 232.3 05/04/2017 09:31 AM    Last diabetic Eye exam:  Lab Results  Component Value Date/Time   HMDIABEYEEXA No Retinopathy 10/16/2018 12:00 AM    Last diabetic Foot exam: No results found for: HMDIABFOOTEX   Lab Results  Component Value Date   CHOL 138 05/12/2020   HDL 50 05/12/2020   LDLCALC 64 05/12/2020   TRIG 153 (H) 05/12/2020   CHOLHDL 2.8 05/12/2020    Hepatic Function Latest Ref Rng & Units 09/29/2021 09/14/2021 03/03/2021  Total Protein 6.5 - 8.1 g/dL 7.9 7.4 7.7  Albumin 3.5 - 5.0 g/dL 3.1(L) - 3.2(L)  AST 15 - 41 U/L 13(L) 9(L) 9(L)  ALT 0 - 44 U/L 8 7(L) 10  Alk Phosphatase 38 - 126 U/L 141(H) - 115  Total Bilirubin 0.3 - 1.2 mg/dL 0.2(L) 0.4 <0.2(L)  Bilirubin, Direct <=0.2 mg/dL - - -    Lab Results  Component Value Date/Time   TSH 2.920 12/16/2020 09:18 AM   TSH 2.68 05/12/2020 09:48  AM   FREET4 1.27 12/16/2020 09:18 AM   FREET4 1.3 05/12/2020 09:48 AM    CBC Latest Ref Rng & Units 09/29/2021 09/14/2021 03/15/2021  WBC 4.0 - 10.5 K/uL 8.6 9.4 10.7(H)  Hemoglobin 13.0 - 17.0 g/dL 11.8(L) 12.1(L) 12.3(L)  Hematocrit 39.0 - 52.0 % 38.4(L) 37.7(L) 41.2  Platelets 150 - 400 K/uL 260 408(H) 285    Lab Results  Component Value Date/Time   VD25OH 59 05/12/2020 09:48 AM   VD25OH 67 10/08/2019 08:29 AM    Clinical ASCVD: Yes  The 10-year ASCVD risk score (Arnett DK, et al., 2019) is: 34.7%   Values used to calculate the score:     Age: 39 years     Sex: Male     Is Non-Hispanic African  American: Yes     Diabetic: Yes     Tobacco smoker: No     Systolic Blood Pressure: 412 mmHg     Is BP treated: Yes     HDL Cholesterol: 50 mg/dL     Total Cholesterol: 138 mg/dL    Depression screen [facility] 2/9 09/21/2021 02/17/2021 01/26/2021  Decreased Interest 0 0 0  Down, Depressed, Hopeless 0 0 0  PHQ - 2 Score 0 0 0  Some recent data might be hidden       Social History   Tobacco Use  Smoking Status Former   Packs/day: 3.00   Years: 25.00   Pack years: 75.00   Types: Cigarettes   Quit date: 03/01/1997   Years since quitting: 24.7  Smokeless Tobacco Never   BP Readings from Last 3 Encounters:  09/21/21 128/74  09/07/21 134/76  07/16/21 134/86   Pulse Readings from Last 3 Encounters:  09/21/21 76  09/07/21 86  07/16/21 91   Wt Readings from Last 3 Encounters:  03/15/21 225 lb (102.1 kg)  03/05/21 225 lb (102.1 kg)  08/14/20 250 lb (113.4 kg)   BMI Readings from Last 3 Encounters:  09/21/21 29.69 kg/m  03/30/21 29.69 kg/m  03/15/21 29.69 kg/m    Assessment/Interventions: Review of patient past medical history, allergies, medications, health status, including review of consultants reports, laboratory and other test data, was performed as part of comprehensive evaluation and provision of chronic care management services.   SDOH:  (Social Determinants of Health) assessments and interventions performed: Yes   Financial Resource Strain: Not on file      CCM Care Plan  No Known Allergies  Medications Reviewed Today     Reviewed by Leda Min, LPN (Licensed Practical Nurse) on 09/21/21 at [place] List Status: <None>   Medication Order Taking? Sig Documenting Provider Last Dose Status Informant  allopurinol (ZYLOPRIM) 300 MG tablet [id number]  TAKE 1 TABLET(300 MG) BY MOUTH DAILY [name], MD  Active   amLODipine (NORVASC) 10 MG tablet [id number]  Take 1 tablet (10 mg total) by mouth daily. [name], MD  Active   cloNIDine  (CATAPRES) 0.2 MG tablet [id number]  Take 1 tablet (0.2 mg total) by mouth 3 (three) times daily. TAKE 1 TABLET(0.2 MG) BY MOUTH THREE TIMES DAILY [name], MD  Active   diclofenac sodium (VOLTAREN) 1 % GEL [id number]  Apply twice a day for affected area on ankle as needed [name], MD  Active Spouse/Significant Other  fluticasone (FLONASE) 50 MCG/ACT nasal spray [id number]  Place 2 sprays into both nostrils daily. [name], MD  Active   furosemide (LASIX) 80 MG tablet [id number]  Take  1 tablet (80 mg total) by mouth daily. [name], MD  Active   gabapentin (NEURONTIN) 300 MG capsule [id number]  Take 2 capsules (600 mg total) by mouth 2 (two) times daily. May also take 2 capsules (600 mg total) daily as needed. [name], MD  Active   glucose blood [facility] VERIO) test strip [id number]  Use as directed to check Blood Glucose twice daily Brita Romp, NP  Active   insulin glargine (LANTUS SOLOSTAR) 100 UNIT/ML Solostar Pen [id number]  Inject 30 Units into the skin at bedtime. Brita Romp, NP  Active   iron polysaccharides (NIFEREX) 150 MG capsule [id number]  TAKE 1 CAPSULE(150 MG) BY MOUTH DAILY [name], MD  Active   Lancets (ONETOUCH DELICA PLUS [id number]) [place] [id number]  USE TO TEST BLOOD SUGAR FOUR TIMES DAILY Cassandria Anger, MD  Active   lisinopril (ZESTRIL) 20 MG tablet [id number]  TAKE 1 TABLET(20 MG) BY MOUTH DAILY [name], MD  Active   lubiprostone (AMITIZA) 24 MCG capsule [id number]  TAKE 1 CAPSULE(24 MCG) BY MOUTH TWICE DAILY WITH A MEAL [name], MD  Active   metoprolol succinate (TOPROL-XL) 50 MG 24 hr tablet [id number]  TAKE 1 TABLET BY MOUTH EVERY DAY WITH OR IMMEDIATELY FOLLOWING A MEAL [name], MD  Active   nystatin (MYCOSTATIN/NYSTOP) powder [id number]  Apply topically 3 (three) times daily.  Patient taking differently: Apply topically as needed.   Terra Bella, [name], MD  Active    oxyCODONE-acetaminophen (PERCOCET) 7.5-325 MG tablet [id number]  Take 1 tablet by mouth every 4 (four) hours as needed. [name], MD  Active   pantoprazole (PROTONIX) 40 MG tablet [id number]  TAKE 1 TABLET(40 MG) BY MOUTH DAILY [name], MD  Active   simvastatin (ZOCOR) 20 MG tablet [id number]  TAKE 1 TABLET(20 MG) BY MOUTH AT BEDTIME [name], MD  Active   tamsulosin (FLOMAX) 0.4 MG CAPS capsule [id number]  TAKE 1 CAPSULE(0.4 MG) BY MOUTH DAILY [name], MD  Active   tiZANidine (ZANAFLEX) 2 MG tablet [id number]  TAKE 1 TABLET(2 MG) BY MOUTH TWICE DAILY AS NEEDED Eulogio Bear, NP  Active   Triamcinolone Acetonide (TRIAMCINOLONE 0.1 % CREAM : EUCERIN) CREA [id number]  Apply 1 application topically 2 (two) times daily as needed.  Patient taking differently: Apply 1 application topically 2 (two) times daily as needed for rash.   [name], MD  Active             Patient Active Problem List   Diagnosis Date Noted   MGUS (monoclonal gammopathy of unknown significance) 03/03/2021   Chronic obstructive pulmonary disease ([place]) 02/17/2021   Common variable immunodeficiency ([place]) 02/17/2021   Osteoarthrosis 02/17/2021   Diverticular disease 01/05/2021   History of cerebrovascular accident 01/05/2021   Hiatal hernia 01/05/2021   Hyperlipidemia 01/05/2021   Glaucoma 01/05/2021   Esotropia of left eye 01/05/2021   Benign prostatic hyperplasia 01/05/2021   History of radiation therapy 01/05/2021   Anemia 01/05/2021   GI bleed 03/18/2020   Acute lower GI bleeding 03/03/2020   Pressure injury of skin 03/03/2020   Thoracic aortic aneurysm without rupture 11/19/2019   Hardening of the aorta (main artery of the heart) ([place]) 11/19/2019   Protein-calorie malnutrition ([place]) 06/10/2019   Avitaminosis D 03/07/2019   Male hypogonadism 07/26/2018   Gynecomastia 06/27/2018   Malignant neoplasm of upper lobe of left lung ([place]) 04/06/2018   History of  malignant neoplasm of parotid gland 02/07/2018   Adenoma of large intestine 11/09/2016   Adult hypothyroidism 09/09/2015   Chronic constipation 05/22/2015   Impetigo bullosa 10/23/2013   Chronic left shoulder pain 09/25/2013   Muscle weakness (generalized) 09/25/2013   Hypercalcemia 08/08/2013   Chronic kidney disease, stage 3 unspecified ([place]) 08/07/2013   AKI (acute kidney injury) ([place]) 07/24/2013   Acute kidney failure, unspecified ([place]) 07/24/2013   Adhesive capsulitis of left shoulder 05/14/2013   GERD (gastroesophageal reflux disease)    Carcinoma of parotid gland ([place]) 11/23/2012   Epidermoid cyst 10/05/2012   Neck pain 08/26/2012   Chronic abdominal pain 08/26/2012   Edema of lower extremity 08/24/2012   Hemiplegia of dominant side as late effect of cerebrovascular disease ([place]) 06/06/2012   Diarrhea 04/17/2012   Alimentary obesity 04/17/2012   Type 2 diabetes mellitus with diabetic neuropathy, unspecified ([place]) 02/13/2012   Recurrent boils 01/12/2012   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 ([place]) 07/15/2011   Hemorrhoids, internal 03/23/2011   Hepatitis 03/02/2011   Steatohepatitis 03/02/2011   Abdominal pain, right upper quadrant 12/16/2010   Abnormal levels of other serum enzymes 12/16/2010   Eosinophil count raised 12/23/2009   HEMATOCHEZIA 12/23/2009   Type 2 diabetes mellitus ([place]) 01/05/2009   Gout 01/05/2009   Hypertensive disorder 01/05/2009   BARRETTS ESOPHAGUS 01/05/2009   DIVERTICULOSIS OF COLON 01/05/2009   History of malignant lymphoma 01/05/2009    Immunization History  Administered Date(s) Administered   Fluad Quad(high Dose 65+) 09/06/2019, 09/02/2020, 09/07/2021   Influenza Split 08/24/2012   Influenza, High Dose Seasonal PF 10/31/2017, 10/02/2018   Influenza,inj,Quad PF,6+ Mos 10/09/2013, 09/05/2014, 11/23/2015, 08/16/2016   Moderna Sars-Covid-2 Vaccination 01/19/2020, 02/19/2020, 11/24/2020   PFIZER(Purple Top)SARS-COV-2 Vaccination  11/24/2020   Pneumococcal Conjugate-13 01/07/2014   Pneumococcal Polysaccharide-23 11/25/2012   Tdap 09/18/2019    Conditions to be addressed/monitored:  hypertension, GERD/barretts Esophagus, Type II DM, hypothyroidism, CKD, hyperlipidemia, CVA.  There are no care plans that you recently modified to display for this patient.      Medication Assistance: None required.  Patient affirms current coverage meets needs.  Patient's preferred pharmacy is:  [facility] ([facility] - [place], [place] [address] Phone: [phone] Fax: [facility], [place] [address] [place] [address] Phone: [phone] Fax: [phone]   Uses pill box? Yes -  Organized by wife Pt endorses 100% compliance  We discussed: Benefits of medication synchronization, packaging and delivery as well as enhanced pharmacist oversight with [employer]. Patient decided to: Continue current medication management strategy  Care Plan and Follow Up Patient Decision:  Patient agrees to Care Plan and Follow-up.  Plan: The care management team will reach out to the patient again over the next 120 days.  Beverly Milch, PharmD Clinical Pharmacist [facility] [phone]  Patient Care Plan: General Pharmacy (Adult)     Problem Identified: hypertension, GERD/barretts Esophagus, Type II DM, hypothyroidism, CKD, hyperlipidemia, CVA.   Priority: High  Onset Date: 03/04/2021     Long-Range Goal: Patient-Specific Goal   Start Date: 03/04/2021  Expected End Date: 09/04/2021  Recent Progress: On track  Priority: High  Note:   Current Barriers:  No specific barriers at this time  Pharmacist Clinical Goal(s):  Patient will achieve adherence to monitoring guidelines and medication adherence  to achieve therapeutic efficacy maintain control of blood  pressure and blood sugar as evidenced by home monitoring  contact provider office for questions/concerns as evidenced notation of same in electronic health record through collaboration with PharmD and provider.   Interventions: 1:1 collaboration with [name], MD regarding development and update of comprehensive plan of care as evidenced by provider attestation and co-signature Inter-disciplinary care team collaboration (see longitudinal plan of care) Comprehensive medication review performed; medication list updated in electronic medical record  Hypertension (BP goal <140/90) -Controlled -Current treatment: Amlodipine 71m daily Clonidine 0.232mTID Lisinopril 2033maily Metoprolol XL 43m67mily -Medications previously tried: none noted -Current home readings: 98/79 P70  -  141/100P 91 -Current dietary habits: up and down   -Denies hypotensive/hypertensive symptoms -Educated on BP goals and benefits of medications for prevention of heart attack, stroke and kidney damage; Importance of home blood pressure monitoring; Symptoms of hypotension and importance of maintaining adequate hydration; -Counseled to monitor BP at home daily, document, and provide log at future appointments -Recommended to continue current medication Recommended Contact providers if BP remains elevated above 140/90 consistently.  Update 07/22/21 Wife states BP has been in the "normal range" Denies any dizziness or HA's Encouraged to continue to monitor at home and document  No changes to meds at this time   Diabetes (A1c goal <7%) -Controlled -Current medications: Lantus 100u/ml 30 units at bedtime -Medications previously tried: none noted -Current home glucose readings fasting glucose: 75-85 post prandial glucose: 160 was the highest it has been -Denies hypoglycemic/hyperglycemic symptoms -Current meal patterns:  Some days patient is not eating as much as others -Current exercise:  minimal -Educated on A1c and blood sugar goals; Prevention and management of hypoglycemic episodes; Benefits of routine self-monitoring of blood sugar; Confirmed patient taking newly reduced dose of Lantus (36 units) -Counseled to check feet daily and get yearly eye exams -Recommended to continue current medication Some concern for hypoglycemia with patients lack of food intake as well as long acting insulin.  Recently reduced dose of Lantus to 36 units, however patient reports some blood sugars in the low 70s to 80s.  Noting age would consider further decrease of insulin to see fasting sugars > 90.  Will consult with PCP on proper dosing moving forward.  Update 07/22/21 Last visit decreased Lantus to 30 units hs Fasting Sugars - 90s Post Prandial - 150-160s Denies any recent hypoglycemia He is moving to a nursing home while his wife has surgery and recovers.  Encouraged them to reach out if they need help with meds during this transition process.  Continue current medications for now.  Patient Goals/Self-Care Activities Patient will:  - take medications as prescribed check glucose daily, document, and provide at future appointments check blood pressure daily, document, and provide at future appointments notify providers of any hypoglycemia.  Follow Up Plan: The care management team will reach out to the patient again over the next 120 days.

## 2021-11-25 NOTE — Telephone Encounter (Signed)
Called patient to ask about rescheduling missed scan and fu, lvm for a return call

## 2021-11-26 ENCOUNTER — Ambulatory Visit: Payer: Medicare Other | Admitting: Radiation Oncology

## 2021-12-02 ENCOUNTER — Telehealth: Payer: Medicare Other

## 2021-12-10 ENCOUNTER — Other Ambulatory Visit: Payer: Self-pay

## 2021-12-10 MED ORDER — ALLOPURINOL 300 MG PO TABS: ORAL_TABLET | ORAL | 2 refills | Status: DC

## 2021-12-10 MED ORDER — METOPROLOL SUCCINATE ER 50 MG PO TB24: ORAL_TABLET | ORAL | 2 refills | Status: DC

## 2021-12-10 MED ORDER — TAMSULOSIN HCL 0.4 MG PO CAPS: ORAL_CAPSULE | ORAL | 2 refills | Status: AC

## 2021-12-10 MED ORDER — LUBIPROSTONE 24 MCG PO CAPS: ORAL_CAPSULE | ORAL | 2 refills | Status: DC

## 2021-12-10 MED ORDER — CLONIDINE HCL 0.2 MG PO TABS: 0.2000 mg | ORAL_TABLET | Freq: Three times a day (TID) | ORAL | 2 refills | Status: DC

## 2021-12-10 MED ORDER — AMLODIPINE BESYLATE 10 MG PO TABS: 10.0000 mg | ORAL_TABLET | Freq: Every day | ORAL | 2 refills | Status: AC

## 2021-12-10 MED ORDER — GABAPENTIN 300 MG PO CAPS: ORAL_CAPSULE | ORAL | 3 refills | Status: DC

## 2021-12-16 DIAGNOSIS — R600 Localized edema: Secondary | ICD-10-CM | POA: Diagnosis not present

## 2021-12-16 DIAGNOSIS — I69959 Hemiplegia and hemiparesis following unspecified cerebrovascular disease affecting unspecified side: Secondary | ICD-10-CM | POA: Diagnosis not present

## 2021-12-21 ENCOUNTER — Telehealth: Payer: Self-pay

## 2021-12-21 ENCOUNTER — Other Ambulatory Visit: Payer: Self-pay | Admitting: Family Medicine

## 2021-12-21 DIAGNOSIS — Z7409 Other reduced mobility: Secondary | ICD-10-CM

## 2021-12-21 NOTE — Telephone Encounter (Signed)
Spoke with pt's son and advised of referral. Nothing further needed at this time.

## 2021-12-21 NOTE — Telephone Encounter (Signed)
Pt's son called asking for referral for PT to help with pt's mobility.   Please advise, thanks!

## 2022-01-05 ENCOUNTER — Telehealth (HOSPITAL_COMMUNITY): Payer: Self-pay | Admitting: Physical Therapy

## 2022-01-05 NOTE — Telephone Encounter (Signed)
S/w Lennette Bihari he wants to ask the MD to send a Home health referral for PT to go to the facility where his father lives. Lennette Bihari request that we close this referral.

## 2022-01-16 DIAGNOSIS — I69959 Hemiplegia and hemiparesis following unspecified cerebrovascular disease affecting unspecified side: Secondary | ICD-10-CM | POA: Diagnosis not present

## 2022-01-16 DIAGNOSIS — R600 Localized edema: Secondary | ICD-10-CM | POA: Diagnosis not present

## 2022-01-18 ENCOUNTER — Other Ambulatory Visit: Payer: Self-pay | Admitting: Family Medicine

## 2022-01-31 ENCOUNTER — Telehealth: Payer: Self-pay

## 2022-01-31 DIAGNOSIS — I69959 Hemiplegia and hemiparesis following unspecified cerebrovascular disease affecting unspecified side: Secondary | ICD-10-CM

## 2022-01-31 DIAGNOSIS — Z8673 Personal history of transient ischemic attack (TIA), and cerebral infarction without residual deficits: Secondary | ICD-10-CM

## 2022-01-31 DIAGNOSIS — R531 Weakness: Secondary | ICD-10-CM

## 2022-01-31 DIAGNOSIS — Z7409 Other reduced mobility: Secondary | ICD-10-CM

## 2022-01-31 NOTE — Telephone Encounter (Signed)
Patient's son called to report pt has a "boil" on his left buttock. They would like to know what you would recommend.   Please advise, thanks!

## 2022-02-01 NOTE — Telephone Encounter (Signed)
Attempted to reach patient's son, Lennette Bihari. No answer and vm is full.

## 2022-02-04 ENCOUNTER — Other Ambulatory Visit: Payer: Self-pay

## 2022-02-04 ENCOUNTER — Other Ambulatory Visit: Payer: Self-pay | Admitting: Family Medicine

## 2022-02-08 ENCOUNTER — Telehealth: Payer: Self-pay | Admitting: Pharmacist

## 2022-02-08 NOTE — Progress Notes (Signed)
Chronic Care Management Pharmacy Assistant   Name: [name]  MRN: [id number] DOB: 1946/09/27   Reason for Encounter: General Adherence Call    Recent office visits:  None  Recent consult visits:  None  Hospital visits:  None in previous 6 months  Medications: Outpatient Encounter Medications as of 02/08/2022  Medication Sig   allopurinol (ZYLOPRIM) 300 MG tablet TAKE 1 TABLET(300 MG) BY MOUTH DAILY   amLODipine (NORVASC) 10 MG tablet Take 1 tablet (10 mg total) by mouth daily.   ASPIRIN ADULT LOW STRENGTH 81 MG EC tablet Take 81 mg by mouth daily.   buPROPion ER (WELLBUTRIN SR) 100 MG 12 hr tablet Take 100 mg by mouth 2 (two) times daily.   cinacalcet (SENSIPAR) 30 MG tablet Take by mouth.   cloNIDine (CATAPRES) 0.2 MG tablet Take 1 tablet (0.2 mg total) by mouth 3 (three) times daily. TAKE 1 TABLET(0.2 MG) BY MOUTH THREE TIMES DAILY   diclofenac sodium (VOLTAREN) 1 % GEL Apply twice a day for affected area on ankle as needed   fluticasone (FLONASE) 50 MCG/ACT nasal spray Place 2 sprays into both nostrils daily.   furosemide (LASIX) 80 MG tablet TAKE (1) TABLET BY MOUTH ONCE DAILY.   gabapentin (NEURONTIN) 300 MG capsule Take 2 capsules (600 mg total) by mouth 2 (two) times daily. May also take 2 capsules (600 mg total) daily as needed.   glucose blood (ONETOUCH VERIO) test strip Use as directed to check Blood Glucose twice daily   insulin glargine (LANTUS SOLOSTAR) 100 UNIT/ML Solostar Pen Inject 30 Units into the skin at bedtime.   iron polysaccharides (NIFEREX) 150 MG capsule TAKE 1 CAPSULE(150 MG) BY MOUTH DAILY   Lancets (ONETOUCH DELICA PLUS [id number]) MISC USE TO TEST BLOOD SUGAR FOUR TIMES DAILY   lisinopril (ZESTRIL) 20 MG tablet TAKE 1 TABLET(20 MG) BY MOUTH DAILY   lubiprostone (AMITIZA) 24 MCG capsule TAKE 1 CAPSULE(24 MCG) BY MOUTH TWICE DAILY WITH A MEAL   metoprolol tartrate (LOPRESSOR) 50 MG tablet Take 50 mg by mouth daily.   nystatin  (MYCOSTATIN/NYSTOP) powder Apply topically 3 (three) times daily. (Patient taking differently: Apply topically as needed.)   oxyCODONE-acetaminophen (PERCOCET) 7.5-325 MG tablet Take 1 tablet by mouth every 4 (four) hours as needed.   pantoprazole (PROTONIX) 40 MG tablet TAKE 1 TABLET(40 MG) BY MOUTH DAILY   simvastatin (ZOCOR) 20 MG tablet TAKE 1 TABLET(20 MG) BY MOUTH AT BEDTIME   tamsulosin (FLOMAX) 0.4 MG CAPS capsule TAKE 1 CAPSULE(0.4 MG) BY MOUTH DAILY   tiZANidine (ZANAFLEX) 2 MG tablet TAKE 1 TABLET(2 MG) BY MOUTH TWICE DAILY AS NEEDED   Triamcinolone Acetonide (TRIAMCINOLONE 0.1 % CREAM : EUCERIN) CREA Apply 1 application topically 2 (two) times daily as needed. (Patient taking differently: Apply 1 application topically 2 (two) times daily as needed for rash.)   No facility-administered encounter medications on file as of 02/08/2022.   Patient Questions: Have you had any problems recently with your health? Patient's wife states the patient is currently in assisted living at [facility] as patients wife had surgery and is unable to care for him at this time.  Have you had any problems with your pharmacy? [facility] is currently managing his medications.  What issues or side effects are you having with your medications? [facility] is currently managing his medications.  What would you like me to pass along to Leata Mouse, CPP for him to help you with?  Patient's wife did not have anything for me to pass along at this time other than the patient is in an assisted living facility. She states at this time it is unknown how long the patient will remain there.  Care Gaps: Medicare Annual Wellness: Overdue Ophthalmology Exam: Overdue since 10/22/2021 Foot Exam: Overdue since 06/27/2018 Hemoglobin A1C: 6.9% on 09/14/2021 Colonoscopy: Next due on 12/01/2026  No future appointments.   Star Rating Drugs: Lisinopril 30 mg last filled  07/19/2021 90 DS Simvastatin 20 mg last filled 01/04/2022 30 DS  April D Calhoun, [place] Pharmacist Assistant [phone]

## 2022-02-08 NOTE — Telephone Encounter (Signed)
Spoke with pt's son, Lennette Bihari. He reports area has healed and they do not need anything further regarding this.   He also mentioned they would like to have PT referral to a [facility] agency that can come to pt's living facility, as it is difficult to get him out of the facility for appointments. New referral placed.   Nothing further needed at this time.

## 2022-02-13 DIAGNOSIS — R600 Localized edema: Secondary | ICD-10-CM | POA: Diagnosis not present

## 2022-02-13 DIAGNOSIS — I69959 Hemiplegia and hemiparesis following unspecified cerebrovascular disease affecting unspecified side: Secondary | ICD-10-CM | POA: Diagnosis not present

## 2022-03-08 ENCOUNTER — Telehealth: Payer: Self-pay | Admitting: Pharmacist

## 2022-03-08 NOTE — Progress Notes (Signed)
? ? ?Chronic Care Management ?Pharmacy Assistant  ? ?Name: [name]  MRN: [id number] DOB: Jul 11, 1946 ? ? ?Reason for Encounter: Disease State - General Adherence Call  ?  ? ? ?Recent office visits:  ?None noted.  ? ?Recent consult visits:  ?None noted.  ? ?Hospital visits:  ?None in previous 6 months ? ?Medications: ?Outpatient Encounter Medications as of 03/08/2022  ?Medication Sig  ? allopurinol (ZYLOPRIM) 300 MG tablet TAKE 1 TABLET(300 MG) BY MOUTH DAILY  ? amLODipine (NORVASC) 10 MG tablet Take 1 tablet (10 mg total) by mouth daily.  ? ASPIRIN ADULT LOW STRENGTH 81 MG EC tablet Take 81 mg by mouth daily.  ? buPROPion ER (WELLBUTRIN SR) 100 MG 12 hr tablet Take 100 mg by mouth 2 (two) times daily.  ? cinacalcet (SENSIPAR) 30 MG tablet Take by mouth.  ? cloNIDine (CATAPRES) 0.2 MG tablet Take 1 tablet (0.2 mg total) by mouth 3 (three) times daily. TAKE 1 TABLET(0.2 MG) BY MOUTH THREE TIMES DAILY  ? diclofenac sodium (VOLTAREN) 1 % GEL Apply twice a day for affected area on ankle as needed  ? fluticasone (FLONASE) 50 MCG/ACT nasal spray Place 2 sprays into both nostrils daily.  ? furosemide (LASIX) 80 MG tablet TAKE (1) TABLET BY MOUTH ONCE DAILY.  ? gabapentin (NEURONTIN) 300 MG capsule Take 2 capsules (600 mg total) by mouth 2 (two) times daily. May also take 2 capsules (600 mg total) daily as needed.  ? glucose blood (ONETOUCH VERIO) test strip Use as directed to check Blood Glucose twice daily  ? insulin glargine (LANTUS SOLOSTAR) 100 UNIT/ML Solostar Pen Inject 30 Units into the skin at bedtime.  ? iron polysaccharides (NIFEREX) 150 MG capsule TAKE 1 CAPSULE(150 MG) BY MOUTH DAILY  ? Lancets (ONETOUCH DELICA PLUS [id number]) MISC USE TO TEST BLOOD SUGAR FOUR TIMES DAILY  ? lisinopril (ZESTRIL) 20 MG tablet TAKE 1 TABLET(20 MG) BY MOUTH DAILY  ? lubiprostone (AMITIZA) 24 MCG capsule TAKE 1 CAPSULE(24 MCG) BY MOUTH TWICE DAILY WITH A MEAL  ? metoprolol tartrate (LOPRESSOR) 50 MG tablet Take 50 mg by mouth  daily.  ? nystatin (MYCOSTATIN/NYSTOP) powder Apply topically 3 (three) times daily. (Patient taking differently: Apply topically as needed.)  ? oxyCODONE-acetaminophen (PERCOCET) 7.5-325 MG tablet Take 1 tablet by mouth every 4 (four) hours as needed.  ? pantoprazole (PROTONIX) 40 MG tablet TAKE 1 TABLET(40 MG) BY MOUTH DAILY  ? simvastatin (ZOCOR) 20 MG tablet TAKE 1 TABLET(20 MG) BY MOUTH AT BEDTIME  ? tamsulosin (FLOMAX) 0.4 MG CAPS capsule TAKE 1 CAPSULE(0.4 MG) BY MOUTH DAILY  ? tiZANidine (ZANAFLEX) 2 MG tablet TAKE 1 TABLET(2 MG) BY MOUTH TWICE DAILY AS NEEDED  ? Triamcinolone Acetonide (TRIAMCINOLONE 0.1 % CREAM : EUCERIN) CREA Apply 1 application topically 2 (two) times daily as needed. (Patient taking differently: Apply 1 application topically 2 (two) times daily as needed for rash.)  ? ?No facility-administered encounter medications on file as of 03/08/2022.  ? ? ?Have you had any problems recently with your health? ? ? ?Have you had any problems with your pharmacy? ? ? ?What issues or side effects are you having with your medications? ? ? ?What would you like me to pass along to [name], CPP for them to help you with?  ? ? ?What can we do to take care of you better? ? ? ? ?Care Gaps: ?Medicare Annual Wellness: Overdue ?Ophthalmology Exam: Overdue since 10/22/2021 ?Foot Exam: Overdue since 06/27/2018 ?Hemoglobin A1C: 6.9% on 09/14/2021 ?  Colonoscopy: Next due on 12/01/2026 ? ? ?Star Rating Drugs: ?Lisinopril 30 mg last filled 07/19/2021 for 90 days  ?Simvastatin 20 mg last filled 03/02/22 for  30 days ? ? ?No future appointments. ? ? ?Multiple attempts were made to contact patient. Attempts were unsuccessful. / ls,CMA  ? ? ?[name], CCMA ?Clinical Pharmacist Assistant  ?[phone] ? ?

## 2022-03-16 DIAGNOSIS — R600 Localized edema: Secondary | ICD-10-CM | POA: Diagnosis not present

## 2022-03-16 DIAGNOSIS — I69959 Hemiplegia and hemiparesis following unspecified cerebrovascular disease affecting unspecified side: Secondary | ICD-10-CM | POA: Diagnosis not present

## 2022-04-07 ENCOUNTER — Other Ambulatory Visit: Payer: Self-pay

## 2022-04-07 ENCOUNTER — Other Ambulatory Visit: Payer: Self-pay | Admitting: Family Medicine

## 2022-04-13 ENCOUNTER — Telehealth: Payer: Self-pay | Admitting: Family Medicine

## 2022-04-13 NOTE — Telephone Encounter (Signed)
Left message for patient to call back and schedule Medicare Annual Wellness Visit (AWV) in office.  ? ?If not able to come in office, please offer to do virtually or by telephone.  Left office number and my jabber [phone]. ? ?Last AWV:06/26/2017 ? ?Please schedule at anytime with Nurse Health Advisor. ?  ?

## 2022-04-15 DIAGNOSIS — R600 Localized edema: Secondary | ICD-10-CM | POA: Diagnosis not present

## 2022-04-15 DIAGNOSIS — I69959 Hemiplegia and hemiparesis following unspecified cerebrovascular disease affecting unspecified side: Secondary | ICD-10-CM | POA: Diagnosis not present

## 2022-05-02 ENCOUNTER — Telehealth: Payer: Self-pay | Admitting: Pharmacist

## 2022-05-02 NOTE — Progress Notes (Signed)
Chronic Care Management Pharmacy Assistant   Name: [name]  MRN: [id number] DOB: [birth year]  Reason for Encounter: Hypertension Adherence Call    Recent office visits:  None  Recent consult visits:  None  Hospital visits:  None in previous 6 months  Medications: Outpatient Encounter Medications as of 05/02/2022  Medication Sig   allopurinol (ZYLOPRIM) 300 MG tablet TAKE 1 TABLET(300 MG) BY MOUTH DAILY   amLODipine (NORVASC) 10 MG tablet Take 1 tablet (10 mg total) by mouth daily.   ASPIRIN ADULT LOW STRENGTH 81 MG EC tablet Take 81 mg by mouth daily.   buPROPion ER (WELLBUTRIN SR) 100 MG 12 hr tablet Take 100 mg by mouth 2 (two) times daily.   cinacalcet (SENSIPAR) 30 MG tablet Take by mouth.   cloNIDine (CATAPRES) 0.2 MG tablet Take 1 tablet (0.2 mg total) by mouth 3 (three) times daily. TAKE 1 TABLET(0.2 MG) BY MOUTH THREE TIMES DAILY   diclofenac Sodium (VOLTAREN) 1 % GEL Apply 1 application. topically 4 (four) times daily.   fluticasone (FLONASE) 50 MCG/ACT nasal spray Place 2 sprays into both nostrils daily.   furosemide (LASIX) 80 MG tablet TAKE (1) TABLET BY MOUTH ONCE DAILY.   gabapentin (NEURONTIN) 300 MG capsule Take 2 capsules (600 mg total) by mouth 2 (two) times daily. May also take 2 capsules (600 mg total) daily as needed.   glucose blood (ONETOUCH VERIO) test strip Use as directed to check Blood Glucose twice daily   insulin glargine (LANTUS SOLOSTAR) 100 UNIT/ML Solostar Pen Inject 30 Units into the skin at bedtime.   iron polysaccharides (NIFEREX) 150 MG capsule TAKE 1 CAPSULE(150 MG) BY MOUTH DAILY   Lancets (ONETOUCH DELICA PLUS [id number]) MISC USE TO TEST BLOOD SUGAR FOUR TIMES DAILY   lisinopril (ZESTRIL) 20 MG tablet TAKE 1 TABLET(20 MG) BY MOUTH DAILY   lubiprostone (AMITIZA) 24 MCG capsule TAKE 1 CAPSULE BY MOUTH TWICE DAILY WITH MEALS   metoprolol tartrate (LOPRESSOR) 50 MG tablet Take 50 mg by mouth daily.   nystatin (MYCOSTATIN/NYSTOP) powder  Apply topically 3 (three) times daily. (Patient taking differently: Apply topically as needed.)   oxyCODONE-acetaminophen (PERCOCET) 7.5-325 MG tablet Take 1 tablet by mouth every 4 (four) hours as needed.   pantoprazole (PROTONIX) 40 MG tablet TAKE 1 TABLET(40 MG) BY MOUTH DAILY   simvastatin (ZOCOR) 20 MG tablet TAKE 1 TABLET(20 MG) BY MOUTH AT BEDTIME   tamsulosin (FLOMAX) 0.4 MG CAPS capsule TAKE 1 CAPSULE(0.4 MG) BY MOUTH DAILY   tiZANidine (ZANAFLEX) 2 MG tablet TAKE 1 TABLET(2 MG) BY MOUTH TWICE DAILY AS NEEDED   traZODone (DESYREL) 100 MG tablet Take 100 mg by mouth at bedtime.   Triamcinolone Acetonide (TRIAMCINOLONE 0.1 % CREAM : EUCERIN) CREA Apply 1 application topically 2 (two) times daily as needed. (Patient taking differently: Apply 1 application topically 2 (two) times daily as needed for rash.)   No facility-administered encounter medications on file as of 05/02/2022.   Reviewed chart prior to disease state call. Spoke with patient regarding BP  Recent Office Vitals: BP Readings from Last 3 Encounters:  09/21/21 128/74  09/07/21 134/76  07/16/21 134/86   Pulse Readings from Last 3 Encounters:  09/21/21 76  09/07/21 86  07/16/21 91    Wt Readings from Last 3 Encounters:  03/15/21 225 lb (102.1 kg)  03/05/21 225 lb (102.1 kg)  08/14/20 250 lb (113.4 kg)     Kidney Function Lab Results  Component Value Date/Time   CREATININE 1.78 (  H) 09/29/2021 03:00 PM   CREATININE 1.69 (H) 09/21/2021 12:42 PM   CREATININE 2.07 (H) 09/14/2021 01:18 PM   GFRNONAA 40 (L) 09/29/2021 03:00 PM   GFRNONAA 35 (L) 05/12/2020 09:48 AM   GFRAA 37 (L) 12/16/2020 09:18 AM   GFRAA 41 (L) 05/12/2020 09:48 AM       Latest Ref Rng & Units 09/29/2021    3:00 PM 09/21/2021   12:42 PM 09/14/2021    1:18 PM  BMP  Glucose 70 - 99 mg/dL 163   116   95    BUN 8 - 23 mg/dL 23   25   30     Creatinine 0.61 - 1.24 mg/dL 1.78   1.69   2.07    BUN/Creat Ratio 6 - 22 (calc)  15   14    Sodium 135 -  145 mmol/L 137   139   142    Potassium 3.5 - 5.1 mmol/L 3.8   4.4   4.9    Chloride 98 - 111 mmol/L 105   103   106    CO2 22 - 32 mmol/L 20   24   27     Calcium 8.9 - 10.3 mg/dL 9.8   9.7   10.0      Current antihypertensive regimen:  Furosemide 80 mg once daily Metoprolol Tartrate 50 mg daily Amlodipine 10 mg daily Lisinopril 20 mg daily  How often are you checking your Blood Pressure?   Current home BP readings:   What recent interventions/DTPs have been made by any provider to improve Blood Pressure control since last CPP Visit:   Any recent hospitalizations or ED visits since last visit with CPP?   What diet changes have been made to improve Blood Pressure Control?    What exercise is being done to improve your Blood Pressure Control?    Adherence Review: Is the patient currently on ACE/ARB medication?  Does the patient have >5 day gap between last estimated fill dates?   **Unsuccessful attempt at contacting patient to complete this call.**  Care Gaps: Medicare Annual Wellness: Last AWV 06/26/2017 Ophthalmology Exam: Overdue since 10/22/2021 Foot Exam: Overdue since 06/27/2018 Hemoglobin A1C: 6.9% on 09/14/2021 Colonoscopy: Completed 12/01/2016  No future appointments.  Star Rating Drugs: Lisinopril 30 mg last filled 07/19/2021 90 DS Simvastatin 20 mg last filled 04/07/2022 30 DS  April D Calhoun, [place] Pharmacist Assistant [phone]

## 2022-05-11 ENCOUNTER — Other Ambulatory Visit: Payer: Self-pay

## 2022-05-11 NOTE — Telephone Encounter (Signed)
Pharmacy faxed a refill request for tiZANidine (ZANAFLEX) 2 MG tablet [[id number]]    Order Details Dose, Route, Frequency: As Directed  Dispense Quantity: 180 tablet Refills: 2        Sig: TAKE 1 TABLET(2 MG) BY MOUTH TWICE DAILY AS NEEDED       Start Date: 05/21/21 End Date: --  Written Date: 05/21/21 Expiration Date: 05/21/22  Original Order:  tiZANidine (ZANAFLEX) 2 MG tablet

## 2022-05-12 MED ORDER — TIZANIDINE HCL 2 MG PO TABS: ORAL_TABLET | ORAL | 0 refills | Status: DC

## 2022-05-12 NOTE — Telephone Encounter (Signed)
Requested medication (s) are due for refill today: yes  Requested medication (s) are on the active medication list: yes    Last refill: 05/21/21 #180  2 refills  Future visit scheduled no  Notes to clinic: Not delegated, please review.  Requested Prescriptions  Pending Prescriptions Disp Refills   tiZANidine (ZANAFLEX) 2 MG tablet 180 tablet 2    Sig: TAKE 1 TABLET(2 MG) BY MOUTH TWICE DAILY AS NEEDED     Not Delegated - Cardiovascular:  Alpha-2 Agonists - tizanidine Failed - 05/11/2022 11:26 AM      Failed - This refill cannot be delegated      Failed - Valid encounter within last 6 months    Recent Outpatient Visits           7 months ago Leg swelling   [place] [name], MD   8 months ago Leg swelling   [place] [name], MD   10 months ago Sore throat   [place] Eulogio Bear, NP   1 year ago Skin ulcer of shoulder with fat layer exposed ([place])   [place] Eulogio Bear, NP   1 year ago Primary hypertension   [place], [name], MD

## 2022-05-12 NOTE — Telephone Encounter (Signed)
Please call this pt in for 6 month f/u medication eval. And refill, since the med requested was under Noemi Chapel, NP, care.   Thank you.

## 2022-05-16 DIAGNOSIS — I69959 Hemiplegia and hemiparesis following unspecified cerebrovascular disease affecting unspecified side: Secondary | ICD-10-CM | POA: Diagnosis not present

## 2022-05-16 DIAGNOSIS — R600 Localized edema: Secondary | ICD-10-CM | POA: Diagnosis not present

## 2022-05-30 ENCOUNTER — Other Ambulatory Visit: Payer: Self-pay | Admitting: Family Medicine

## 2022-05-31 ENCOUNTER — Other Ambulatory Visit: Payer: Self-pay | Admitting: Family Medicine

## 2022-05-31 NOTE — Telephone Encounter (Signed)
Attempted to call pt and call was answered but no one spoke on the other end. Med last RF 02/04/22 #30 2 RF. Labs are overdue and overdue for OV. Sent pt message on MyChart to call office to make appt.  Routing to office Requested Prescriptions  Pending Prescriptions Disp Refills   furosemide (LASIX) 80 MG tablet [Pharmacy Med Name: FUROSEMIDE 80 MG TABLET] 30 tablet 11    Sig: TAKE (1) TABLET BY MOUTH ONCE DAILY.     Cardiovascular:  Diuretics - Loop Failed - 05/31/2022  3:44 PM      Failed - K in normal range and within 180 days    Potassium  Date Value Ref Range Status  09/29/2021 3.8 3.5 - 5.1 mmol/L Final  08/16/2011 4.3 mmol/L Final         Failed - Ca in normal range and within 180 days    Calcium  Date Value Ref Range Status  09/29/2021 9.8 8.9 - 10.3 mg/dL Final  08/16/2011 10.3 mg/dL Final   Calcium, Total (PTH)  Date Value Ref Range Status  08/12/2011 11.4 (H) 8.4 - 10.5 mg/dL Final         Failed - Na in normal range and within 180 days    Sodium  Date Value Ref Range Status  09/29/2021 137 135 - 145 mmol/L Final  12/16/2020 143 134 - 144 mmol/L Final         Failed - Cr in normal range and within 180 days    Creatinine  Date Value Ref Range Status  09/29/2021 1.78 (H) 0.61 - 1.24 mg/dL Final   Creat  Date Value Ref Range Status  09/21/2021 1.69 (H) 0.70 - 1.28 mg/dL Final   Creatinine, Urine  Date Value Ref Range Status  05/12/2020 134 20 - 320 mg/dL Final         Failed - Cl in normal range and within 180 days    Chloride  Date Value Ref Range Status  09/29/2021 105 98 - 111 mmol/L Final         Failed - Mg Level in normal range and within 180 days    Magnesium  Date Value Ref Range Status  08/10/2020 1.4 (L) 1.5 - 2.5 mg/dL Final         Failed - Valid encounter within last 6 months    Recent Outpatient Visits           8 months ago Leg swelling   [place] [name], MD   8 months ago Leg swelling   [place] [name], MD   10 months ago Sore throat   [place] Eulogio Bear, NP   1 year ago Skin ulcer of shoulder with fat layer exposed ([place])   [place], Jessica A, NP   1 year ago Primary hypertension   [place], [name], MD              Passed - Last BP in normal range    BP Readings from Last 1 Encounters:  09/21/21 128/74

## 2022-06-07 ENCOUNTER — Other Ambulatory Visit: Payer: Self-pay | Admitting: Family Medicine

## 2022-06-07 NOTE — Telephone Encounter (Signed)
Requested medication (s) are due for refill today:   Yes  Requested medication (s) are on the active medication list:   Yes  Future visit scheduled:   No     Last ordered: 05/31/2022 and 05/30/2022 #30, 11 refills each date.  Returned because labs overdue, needs OV, several attempts to contact him have been unsuccessful including a MyChart message.   I attempted to call him and a lady informed me he is living at [facility] now.      Requested Prescriptions  Pending Prescriptions Disp Refills   furosemide (LASIX) 80 MG tablet [Pharmacy Med Name: FUROSEMIDE 80 MG TABLET] 30 tablet 2    Sig: TAKE (1) TABLET BY MOUTH ONCE DAILY.     Cardiovascular:  Diuretics - Loop Failed - 06/07/2022 10:42 AM      Failed - K in normal range and within 180 days    Potassium  Date Value Ref Range Status  09/29/2021 3.8 3.5 - 5.1 mmol/L Final  08/16/2011 4.3 mmol/L Final         Failed - Ca in normal range and within 180 days    Calcium  Date Value Ref Range Status  09/29/2021 9.8 8.9 - 10.3 mg/dL Final  16/09/9603 54.0 mg/dL Final   Calcium, Total (PTH)  Date Value Ref Range Status  08/12/2011 11.4 (H) 8.4 - 10.5 mg/dL Final         Failed - Na in normal range and within 180 days    Sodium  Date Value Ref Range Status  09/29/2021 137 135 - 145 mmol/L Final  12/16/2020 143 134 - 144 mmol/L Final         Failed - Cr in normal range and within 180 days    Creatinine  Date Value Ref Range Status  09/29/2021 1.78 (H) 0.61 - 1.24 mg/dL Final   Creat  Date Value Ref Range Status  09/21/2021 1.69 (H) 0.70 - 1.28 mg/dL Final   Creatinine, Urine  Date Value Ref Range Status  05/12/2020 134 20 - 320 mg/dL Final         Failed - Cl in normal range and within 180 days    Chloride  Date Value Ref Range Status  09/29/2021 105 98 - 111 mmol/L Final         Failed - Mg Level in normal range and within 180 days    Magnesium  Date Value Ref Range Status  08/10/2020 1.4 (L) 1.5  - 2.5 mg/dL Final         Failed - Valid encounter within last 6 months    Recent Outpatient Visits           8 months ago Leg swelling   [facility] Donita Brooks, MD   9 months ago Leg swelling   [facility] Donita Brooks, MD   10 months ago Sore throat   [facility] Valentino Nose, NP   1 year ago Skin ulcer of shoulder with fat layer exposed (H[facility])   [facility] Valentino Nose, NP   1 year ago Primary hypertension   [facility] [place], Velna Hatchet, MD              Passed - Last BP in normal range    BP Readings from Last 1 Encounters:  09/21/21 128/74

## 2022-06-15 DIAGNOSIS — R600 Localized edema: Secondary | ICD-10-CM | POA: Diagnosis not present

## 2022-06-15 DIAGNOSIS — I69959 Hemiplegia and hemiparesis following unspecified cerebrovascular disease affecting unspecified side: Secondary | ICD-10-CM | POA: Diagnosis not present

## 2022-06-22 ENCOUNTER — Telehealth: Payer: Self-pay | Admitting: *Deleted

## 2022-06-23 ENCOUNTER — Telehealth: Payer: Self-pay

## 2022-06-23 NOTE — Telephone Encounter (Signed)
Pt's spouse came in to have FL2 form completed by pcp. Form placed in nurse's folder. Will call pt's spouse when available to pick up.   Cb#: [phone]

## 2022-06-24 ENCOUNTER — Telehealth: Payer: Self-pay

## 2022-06-24 NOTE — Telephone Encounter (Signed)
Dellie Burns w/ [facility] [employer] called wanted to ask you a question as to pt who is a diabetic is not   1) being followed by a podiatrist for his foot/toe nails  2) Pt's toe nails are so long it has curved at the  and not being trimmed/cut  3) if it is not well maintain ie trimmed, what are some concerns that could happen?  Pls advice

## 2022-06-24 NOTE — Telephone Encounter (Signed)
Call Dellie Burns w/ [facility] [employer] and is aware of Dr. Samella Parr advice re pt's toe nails.   Noting at this time

## 2022-06-28 NOTE — Telephone Encounter (Signed)
Its in Dr. Samella Parr file, so when its completed we'll call pt for pick up

## 2022-07-04 NOTE — Telephone Encounter (Signed)
Forms completed per Dr. Dennard Schaumann, sent to front desk; call pt's wife, per wife will pick up forms today

## 2022-07-11 DIAGNOSIS — M6281 Muscle weakness (generalized): Secondary | ICD-10-CM | POA: Diagnosis not present

## 2022-07-11 DIAGNOSIS — I2510 Atherosclerotic heart disease of native coronary artery without angina pectoris: Secondary | ICD-10-CM | POA: Diagnosis not present

## 2022-07-11 DIAGNOSIS — M109 Gout, unspecified: Secondary | ICD-10-CM | POA: Diagnosis not present

## 2022-07-11 DIAGNOSIS — I10 Essential (primary) hypertension: Secondary | ICD-10-CM | POA: Diagnosis not present

## 2022-07-11 DIAGNOSIS — G629 Polyneuropathy, unspecified: Secondary | ICD-10-CM | POA: Diagnosis not present

## 2022-07-11 DIAGNOSIS — M1991 Primary osteoarthritis, unspecified site: Secondary | ICD-10-CM | POA: Diagnosis not present

## 2022-07-11 DIAGNOSIS — G894 Chronic pain syndrome: Secondary | ICD-10-CM | POA: Diagnosis not present

## 2022-07-11 DIAGNOSIS — E119 Type 2 diabetes mellitus without complications: Secondary | ICD-10-CM | POA: Diagnosis not present

## 2022-07-12 DIAGNOSIS — E119 Type 2 diabetes mellitus without complications: Secondary | ICD-10-CM | POA: Diagnosis not present

## 2022-07-13 DIAGNOSIS — M79602 Pain in left arm: Secondary | ICD-10-CM | POA: Diagnosis not present

## 2022-07-13 DIAGNOSIS — M6281 Muscle weakness (generalized): Secondary | ICD-10-CM | POA: Diagnosis not present

## 2022-07-13 DIAGNOSIS — I890 Lymphedema, not elsewhere classified: Secondary | ICD-10-CM | POA: Diagnosis not present

## 2022-07-13 DIAGNOSIS — E119 Type 2 diabetes mellitus without complications: Secondary | ICD-10-CM | POA: Diagnosis not present

## 2022-07-13 DIAGNOSIS — D0220 Carcinoma in situ of unspecified bronchus and lung: Secondary | ICD-10-CM | POA: Diagnosis not present

## 2022-07-13 DIAGNOSIS — R279 Unspecified lack of coordination: Secondary | ICD-10-CM | POA: Diagnosis not present

## 2022-07-13 DIAGNOSIS — I69351 Hemiplegia and hemiparesis following cerebral infarction affecting right dominant side: Secondary | ICD-10-CM | POA: Diagnosis not present

## 2022-07-14 ENCOUNTER — Emergency Department (HOSPITAL_COMMUNITY): Payer: Medicare Other

## 2022-07-14 ENCOUNTER — Encounter (HOSPITAL_COMMUNITY): Payer: Self-pay

## 2022-07-14 ENCOUNTER — Other Ambulatory Visit: Payer: Self-pay

## 2022-07-14 ENCOUNTER — Inpatient Hospital Stay (HOSPITAL_COMMUNITY)
Admission: EM | Admit: 2022-07-14 | Discharge: 2022-07-17 | DRG: 378 | Disposition: A | Discharge status: Skilled Nursing Facility | Payer: Medicare Other | Source: Skilled Nursing Facility | Attending: Internal Medicine | Admitting: Internal Medicine

## 2022-07-14 DIAGNOSIS — D7389 Other diseases of spleen: Secondary | ICD-10-CM | POA: Diagnosis present

## 2022-07-14 DIAGNOSIS — Z8249 Family history of ischemic heart disease and other diseases of the circulatory system: Secondary | ICD-10-CM

## 2022-07-14 DIAGNOSIS — I129 Hypertensive chronic kidney disease with stage 1 through stage 4 chronic kidney disease, or unspecified chronic kidney disease: Secondary | ICD-10-CM | POA: Diagnosis not present

## 2022-07-14 DIAGNOSIS — K219 Gastro-esophageal reflux disease without esophagitis: Secondary | ICD-10-CM | POA: Diagnosis not present

## 2022-07-14 DIAGNOSIS — Z87891 Personal history of nicotine dependence: Secondary | ICD-10-CM

## 2022-07-14 DIAGNOSIS — D62 Acute posthemorrhagic anemia: Secondary | ICD-10-CM | POA: Diagnosis not present

## 2022-07-14 DIAGNOSIS — I69322 Dysarthria following cerebral infarction: Secondary | ICD-10-CM

## 2022-07-14 DIAGNOSIS — E1143 Type 2 diabetes mellitus with diabetic autonomic (poly)neuropathy: Secondary | ICD-10-CM | POA: Diagnosis not present

## 2022-07-14 DIAGNOSIS — R109 Unspecified abdominal pain: Secondary | ICD-10-CM | POA: Diagnosis not present

## 2022-07-14 DIAGNOSIS — E1122 Type 2 diabetes mellitus with diabetic chronic kidney disease: Secondary | ICD-10-CM | POA: Diagnosis not present

## 2022-07-14 DIAGNOSIS — D132 Benign neoplasm of duodenum: Secondary | ICD-10-CM | POA: Diagnosis not present

## 2022-07-14 DIAGNOSIS — E1169 Type 2 diabetes mellitus with other specified complication: Secondary | ICD-10-CM

## 2022-07-14 DIAGNOSIS — Z79899 Other long term (current) drug therapy: Secondary | ICD-10-CM

## 2022-07-14 DIAGNOSIS — M1990 Unspecified osteoarthritis, unspecified site: Secondary | ICD-10-CM | POA: Diagnosis present

## 2022-07-14 DIAGNOSIS — K3189 Other diseases of stomach and duodenum: Secondary | ICD-10-CM | POA: Diagnosis not present

## 2022-07-14 DIAGNOSIS — J449 Chronic obstructive pulmonary disease, unspecified: Secondary | ICD-10-CM | POA: Diagnosis not present

## 2022-07-14 DIAGNOSIS — K2270 Barrett's esophagus without dysplasia: Secondary | ICD-10-CM | POA: Diagnosis not present

## 2022-07-14 DIAGNOSIS — K922 Gastrointestinal hemorrhage, unspecified: Secondary | ICD-10-CM | POA: Diagnosis not present

## 2022-07-14 DIAGNOSIS — K3184 Gastroparesis: Secondary | ICD-10-CM | POA: Diagnosis not present

## 2022-07-14 DIAGNOSIS — Z79891 Long term (current) use of opiate analgesic: Secondary | ICD-10-CM

## 2022-07-14 DIAGNOSIS — Z7982 Long term (current) use of aspirin: Secondary | ICD-10-CM

## 2022-07-14 DIAGNOSIS — K317 Polyp of stomach and duodenum: Secondary | ICD-10-CM | POA: Diagnosis not present

## 2022-07-14 DIAGNOSIS — K449 Diaphragmatic hernia without obstruction or gangrene: Secondary | ICD-10-CM | POA: Diagnosis not present

## 2022-07-14 DIAGNOSIS — H409 Unspecified glaucoma: Secondary | ICD-10-CM | POA: Diagnosis not present

## 2022-07-14 DIAGNOSIS — E1140 Type 2 diabetes mellitus with diabetic neuropathy, unspecified: Secondary | ICD-10-CM | POA: Diagnosis present

## 2022-07-14 DIAGNOSIS — E785 Hyperlipidemia, unspecified: Secondary | ICD-10-CM | POA: Diagnosis present

## 2022-07-14 DIAGNOSIS — K920 Hematemesis: Secondary | ICD-10-CM | POA: Diagnosis not present

## 2022-07-14 DIAGNOSIS — D509 Iron deficiency anemia, unspecified: Secondary | ICD-10-CM | POA: Diagnosis present

## 2022-07-14 DIAGNOSIS — K2970 Gastritis, unspecified, without bleeding: Secondary | ICD-10-CM | POA: Diagnosis not present

## 2022-07-14 DIAGNOSIS — Z66 Do not resuscitate: Secondary | ICD-10-CM | POA: Diagnosis not present

## 2022-07-14 DIAGNOSIS — I10 Essential (primary) hypertension: Secondary | ICD-10-CM | POA: Diagnosis not present

## 2022-07-14 DIAGNOSIS — N400 Enlarged prostate without lower urinary tract symptoms: Secondary | ICD-10-CM | POA: Diagnosis present

## 2022-07-14 DIAGNOSIS — E538 Deficiency of other specified B group vitamins: Secondary | ICD-10-CM | POA: Diagnosis present

## 2022-07-14 DIAGNOSIS — I69351 Hemiplegia and hemiparesis following cerebral infarction affecting right dominant side: Secondary | ICD-10-CM | POA: Diagnosis not present

## 2022-07-14 DIAGNOSIS — Z923 Personal history of irradiation: Secondary | ICD-10-CM

## 2022-07-14 DIAGNOSIS — G8190 Hemiplegia, unspecified affecting unspecified side: Secondary | ICD-10-CM | POA: Diagnosis not present

## 2022-07-14 DIAGNOSIS — K254 Chronic or unspecified gastric ulcer with hemorrhage: Secondary | ICD-10-CM | POA: Diagnosis not present

## 2022-07-14 DIAGNOSIS — D472 Monoclonal gammopathy: Secondary | ICD-10-CM | POA: Diagnosis not present

## 2022-07-14 DIAGNOSIS — N2889 Other specified disorders of kidney and ureter: Secondary | ICD-10-CM | POA: Diagnosis not present

## 2022-07-14 DIAGNOSIS — Z85118 Personal history of other malignant neoplasm of bronchus and lung: Secondary | ICD-10-CM

## 2022-07-14 DIAGNOSIS — R531 Weakness: Secondary | ICD-10-CM | POA: Diagnosis not present

## 2022-07-14 DIAGNOSIS — R6889 Other general symptoms and signs: Secondary | ICD-10-CM | POA: Diagnosis not present

## 2022-07-14 DIAGNOSIS — Z85818 Personal history of malignant neoplasm of other sites of lip, oral cavity, and pharynx: Secondary | ICD-10-CM

## 2022-07-14 DIAGNOSIS — N1832 Chronic kidney disease, stage 3b: Secondary | ICD-10-CM | POA: Diagnosis not present

## 2022-07-14 DIAGNOSIS — K5909 Other constipation: Secondary | ICD-10-CM | POA: Diagnosis present

## 2022-07-14 DIAGNOSIS — K7581 Nonalcoholic steatohepatitis (NASH): Secondary | ICD-10-CM | POA: Diagnosis not present

## 2022-07-14 DIAGNOSIS — M109 Gout, unspecified: Secondary | ICD-10-CM | POA: Diagnosis present

## 2022-07-14 DIAGNOSIS — Z9049 Acquired absence of other specified parts of digestive tract: Secondary | ICD-10-CM

## 2022-07-14 DIAGNOSIS — R52 Pain, unspecified: Secondary | ICD-10-CM | POA: Diagnosis not present

## 2022-07-14 DIAGNOSIS — G8929 Other chronic pain: Secondary | ICD-10-CM | POA: Diagnosis present

## 2022-07-14 DIAGNOSIS — Z794 Long term (current) use of insulin: Secondary | ICD-10-CM

## 2022-07-14 DIAGNOSIS — Z8572 Personal history of non-Hodgkin lymphomas: Secondary | ICD-10-CM

## 2022-07-14 DIAGNOSIS — N281 Cyst of kidney, acquired: Secondary | ICD-10-CM | POA: Diagnosis not present

## 2022-07-14 DIAGNOSIS — E119 Type 2 diabetes mellitus without complications: Secondary | ICD-10-CM

## 2022-07-14 DIAGNOSIS — Z743 Need for continuous supervision: Secondary | ICD-10-CM | POA: Diagnosis not present

## 2022-07-14 DIAGNOSIS — K319 Disease of stomach and duodenum, unspecified: Secondary | ICD-10-CM | POA: Diagnosis not present

## 2022-07-14 LAB — BPAM RBC
Blood Product Expiration Date: 202309092359
Unit Type and Rh: 5100

## 2022-07-14 LAB — URINALYSIS, ROUTINE W REFLEX MICROSCOPIC
Bilirubin Urine: NEGATIVE
Glucose, UA: NEGATIVE mg/dL
Hgb urine dipstick: NEGATIVE
Ketones, ur: NEGATIVE mg/dL
Nitrite: NEGATIVE
Protein, ur: 100 mg/dL — AB
Specific Gravity, Urine: 1.016 (ref 1.005–1.030)
pH: 7 (ref 5.0–8.0)

## 2022-07-14 LAB — TROPONIN I (HIGH SENSITIVITY)
Troponin I (High Sensitivity): 5 ng/L (ref <18)
Troponin I (High Sensitivity): 4 ng/L (ref <18)

## 2022-07-14 LAB — CBC
HCT: 32.4 % — ABNORMAL LOW (ref 39.0–52.0)
HCT: 29.7 % — ABNORMAL LOW (ref 39.0–52.0)
Hemoglobin: 9.7 g/dL — ABNORMAL LOW (ref 13.0–17.0)
Hemoglobin: 9.1 g/dL — ABNORMAL LOW (ref 13.0–17.0)
MCH: 21.8 pg — ABNORMAL LOW (ref 26.0–34.0)
MCH: 22.4 pg — ABNORMAL LOW (ref 26.0–34.0)
MCHC: 29.9 g/dL — ABNORMAL LOW (ref 30.0–36.0)
MCHC: 30.6 g/dL (ref 30.0–36.0)
MCV: 73 fL — ABNORMAL LOW (ref 80.0–100.0)
MCV: 73 fL — ABNORMAL LOW (ref 80.0–100.0)
Platelets: 321 10*3/uL (ref 150–400)
Platelets: 345 10*3/uL (ref 150–400)
RBC: 4.44 MIL/uL (ref 4.22–5.81)
RBC: 4.07 MIL/uL — ABNORMAL LOW (ref 4.22–5.81)
RDW: 19.4 % — ABNORMAL HIGH (ref 11.5–15.5)
RDW: 18.7 % — ABNORMAL HIGH (ref 11.5–15.5)
WBC: 14.6 10*3/uL — ABNORMAL HIGH (ref 4.0–10.5)
WBC: 10.3 10*3/uL (ref 4.0–10.5)
nRBC: 0 % (ref 0.0–0.2)
nRBC: 0 % (ref 0.0–0.2)

## 2022-07-14 LAB — COMPREHENSIVE METABOLIC PANEL
ALT: 8 U/L (ref 0–44)
AST: 9 U/L — ABNORMAL LOW (ref 15–41)
Albumin: 3 g/dL — ABNORMAL LOW (ref 3.5–5.0)
Alkaline Phosphatase: 98 U/L (ref 38–126)
Anion gap: 5 (ref 5–15)
BUN: 23 mg/dL (ref 8–23)
CO2: 28 mmol/L (ref 22–32)
Calcium: 8.9 mg/dL (ref 8.9–10.3)
Chloride: 107 mmol/L (ref 98–111)
Creatinine, Ser: 1.73 mg/dL — ABNORMAL HIGH (ref 0.61–1.24)
GFR, Estimated: 41 mL/min — ABNORMAL LOW (ref >60)
Glucose, Bld: 163 mg/dL — ABNORMAL HIGH (ref 70–99)
Potassium: 4 mmol/L (ref 3.5–5.1)
Sodium: 140 mmol/L (ref 135–145)
Total Bilirubin: 0.4 mg/dL (ref 0.3–1.2)
Total Protein: 7.3 g/dL (ref 6.5–8.1)

## 2022-07-14 LAB — TYPE AND SCREEN
ABO/RH(D): O POS
Antibody Screen: NEGATIVE
Unit division: 0

## 2022-07-14 LAB — LIPASE, BLOOD: Lipase: 27 U/L (ref 11–51)

## 2022-07-14 LAB — PREPARE RBC (CROSSMATCH)

## 2022-07-14 MED ORDER — POLYETHYLENE GLYCOL 3350 17 G PO PACK
17.0000 g | PACK | Freq: Two times a day (BID) | ORAL | Status: AC
  Administered 2022-07-14 – 2022-07-16 (×3): 17 g via ORAL
  Filled 2022-07-14 (×3): qty 1

## 2022-07-14 MED ORDER — TAMSULOSIN HCL 0.4 MG PO CAPS
0.4000 mg | ORAL_CAPSULE | Freq: Every day | ORAL | Status: DC
  Administered 2022-07-14 – 2022-07-17 (×4): 0.4 mg via ORAL
  Filled 2022-07-14 (×4): qty 1

## 2022-07-14 MED ORDER — AMLODIPINE BESYLATE 5 MG PO TABS
10.0000 mg | ORAL_TABLET | Freq: Every day | ORAL | Status: DC
  Administered 2022-07-15 – 2022-07-17 (×3): 10 mg via ORAL
  Filled 2022-07-14 (×3): qty 2

## 2022-07-14 MED ORDER — CINACALCET HCL 30 MG PO TABS
30.0000 mg | ORAL_TABLET | Freq: Every day | ORAL | Status: DC
  Administered 2022-07-15 – 2022-07-17 (×3): 30 mg via ORAL
  Filled 2022-07-14 (×3): qty 1

## 2022-07-14 MED ORDER — PANTOPRAZOLE SODIUM 40 MG IV SOLR: 40.0000 mg | Freq: Two times a day (BID) | INTRAVENOUS | Status: DC

## 2022-07-14 MED ORDER — SIMVASTATIN 20 MG PO TABS
20.0000 mg | ORAL_TABLET | Freq: Every day | ORAL | Status: DC
  Administered 2022-07-14 – 2022-07-16 (×2): 20 mg via ORAL
  Filled 2022-07-14 (×2): qty 1

## 2022-07-14 MED ORDER — METOPROLOL TARTRATE 50 MG PO TABS
50.0000 mg | ORAL_TABLET | Freq: Every day | ORAL | Status: DC
  Administered 2022-07-15 – 2022-07-17 (×3): 50 mg via ORAL
  Filled 2022-07-14 (×3): qty 1

## 2022-07-14 MED ORDER — ONDANSETRON HCL 4 MG PO TABS: 4.0000 mg | ORAL_TABLET | Freq: Four times a day (QID) | ORAL | Status: DC | PRN

## 2022-07-14 MED ORDER — GADOBUTROL 1 MMOL/ML IV SOLN
10.0000 mL | Freq: Once | INTRAVENOUS | Status: AC | PRN
  Administered 2022-07-14: 10 mL via INTRAVENOUS

## 2022-07-14 MED ORDER — IOHEXOL 350 MG/ML SOLN
100.0000 mL | Freq: Once | INTRAVENOUS | Status: AC | PRN
  Administered 2022-07-14: 100 mL via INTRAVENOUS

## 2022-07-14 MED ORDER — PANTOPRAZOLE 80MG IVPB - SIMPLE MED
80.0000 mg | Freq: Once | INTRAVENOUS | Status: AC
  Administered 2022-07-14: 80 mg via INTRAVENOUS
  Filled 2022-07-14: qty 100

## 2022-07-14 MED ORDER — SODIUM CHLORIDE 0.9% IV SOLUTION: Freq: Once | INTRAVENOUS | Status: DC

## 2022-07-14 MED ORDER — PANTOPRAZOLE INFUSION (NEW) - SIMPLE MED
8.0000 mg/h | INTRAVENOUS | Status: DC
  Administered 2022-07-14 – 2022-07-15 (×4): 8 mg/h via INTRAVENOUS
  Filled 2022-07-14: qty 80
  Filled 2022-07-14 (×4): qty 100

## 2022-07-14 MED ORDER — SODIUM CHLORIDE 0.9 % IV SOLN: INTRAVENOUS | Status: DC

## 2022-07-14 MED ORDER — ONDANSETRON HCL 4 MG/2ML IJ SOLN: 4.0000 mg | Freq: Four times a day (QID) | INTRAMUSCULAR | Status: DC | PRN

## 2022-07-14 MED ORDER — IOHEXOL 300 MG/ML  SOLN
100.0000 mL | Freq: Once | INTRAMUSCULAR | Status: DC | PRN
Start: 2022-07-14 — End: 2022-07-17

## 2022-07-14 MED ORDER — LISINOPRIL 10 MG PO TABS
20.0000 mg | ORAL_TABLET | Freq: Every day | ORAL | Status: DC
  Administered 2022-07-14 – 2022-07-17 (×4): 20 mg via ORAL
  Filled 2022-07-14 (×4): qty 2

## 2022-07-14 MED ORDER — OXYCODONE-ACETAMINOPHEN 5-325 MG PO TABS
1.5000 | ORAL_TABLET | Freq: Four times a day (QID) | ORAL | Status: DC | PRN
  Administered 2022-07-16 – 2022-07-17 (×3): 1.5 via ORAL
  Filled 2022-07-14 (×3): qty 2

## 2022-07-14 MED ORDER — CLONIDINE HCL 0.1 MG PO TABS
0.1000 mg | ORAL_TABLET | Freq: Two times a day (BID) | ORAL | Status: DC
  Administered 2022-07-14 – 2022-07-17 (×6): 0.1 mg via ORAL
  Filled 2022-07-14 (×6): qty 1

## 2022-07-14 MED ORDER — ACETAMINOPHEN 650 MG RE SUPP: 650.0000 mg | Freq: Four times a day (QID) | RECTAL | Status: DC | PRN

## 2022-07-14 MED ORDER — SENNOSIDES-DOCUSATE SODIUM 8.6-50 MG PO TABS
2.0000 | ORAL_TABLET | Freq: Two times a day (BID) | ORAL | Status: DC
  Administered 2022-07-14 – 2022-07-17 (×6): 2 via ORAL
  Filled 2022-07-14 (×6): qty 2

## 2022-07-14 MED ORDER — ALLOPURINOL 100 MG PO TABS
300.0000 mg | ORAL_TABLET | Freq: Every day | ORAL | Status: DC
  Administered 2022-07-14 – 2022-07-17 (×4): 300 mg via ORAL
  Filled 2022-07-14 (×4): qty 3

## 2022-07-14 MED ORDER — INSULIN GLARGINE-YFGN 100 UNIT/ML ~~LOC~~ SOLN
10.0000 [IU] | Freq: Every day | SUBCUTANEOUS | Status: DC
  Administered 2022-07-14 – 2022-07-16 (×3): 10 [IU] via SUBCUTANEOUS
  Filled 2022-07-14 (×4): qty 0.1

## 2022-07-14 MED ORDER — ACETAMINOPHEN 325 MG PO TABS: 650.0000 mg | ORAL_TABLET | Freq: Four times a day (QID) | ORAL | Status: DC | PRN

## 2022-07-14 NOTE — ED Triage Notes (Signed)
Right sided ABD pain, with coffee ground emesis x 1 occurrance today

## 2022-07-14 NOTE — ED Provider Notes (Signed)
[facility] Provider Note  CSN: [id number] Arrival date & time: 07/14/22 7341  Chief Complaint(s) Abdominal Pain  HPI [name] is a 76 y.o. 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  Patient currently lives in a facility and staff at the facility called EMS after the patient had brown coffee-ground emesis x1 today.  Patient denies chest pain, shortness of breath, diarrhea, headache, fever or other systemic symptoms.   Past Medical History Past Medical History:  Diagnosis Date   Arthritis    Asthma    "hx of"   Barrett's esophagus    EGD 03/23/2011 & EGD 2/09 bx proven   BPH (benign prostatic hyperplasia)    Cancer of parotid gland ([place]) 11/23/12   Adenocarcinoma   Chronic abdominal pain    Chronic constipation    Colon polyp 03/23/2011   tubular adenoma, Dr. Gala Romney   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 ([place]) 07/15/2011   CVA (cerebral infarction) 1998   right sided deficit   Delayed gastric emptying 2018   Diverticulosis    TCS 03/23/11 pancolonic diverticula &TCS 5/08, pancolonic diverticula   DM (diabetes mellitus) ([place])    Edema of lower extremity 12/21/12   bilateral    Esotropia of left eye    GERD (gastroesophageal reflux disease)    Glaucoma (increased eye pressure)    Gout    Gout    Hemorrhagic colitis 06/06/2012.   Hemorrhoids, internal 03/23/2011   tcs by Dr. Gala Romney   Hepatitis    esosiniphilic, tx with prednisone   Hiatal hernia    History of radiation therapy 05/21/18- 05/30/18   Left Lung/ 54 Gy delivered in 3 fractions of 18 Gy. SBRT   HTN (hypertension)    Hx of radiation therapy 1974   right base of skull area-lymphoma   Hyperlipidemia    Lower facial weakness    Right   Lymphoma ([place]) 1974   XRT at [facility], right base of skull area   Neuropathy     Non-small cell lung cancer ([place]) dx'd 04/26/18   Peripheral edema    R>L legs   Rash    chronic, recurrent, R>L legs   Renal insufficiency    Steatohepatitis    liver biopsy 2009   Stroke [facility]) 1998   right hemiparesis/plegia   Patient Active Problem List   Diagnosis Date Noted   Coffee ground emesis 07/14/2022   MGUS (monoclonal gammopathy of unknown significance) 03/03/2021   Chronic obstructive pulmonary disease ([place]) 02/17/2021   Common variable immunodeficiency ([place]) 02/17/2021   Osteoarthrosis 02/17/2021   Diverticular disease 01/05/2021   History of cerebrovascular accident 01/05/2021   Hiatal hernia 01/05/2021   Hyperlipidemia 01/05/2021   Glaucoma 01/05/2021   Esotropia of left eye 01/05/2021   Benign prostatic hyperplasia 01/05/2021   History of radiation therapy 01/05/2021   Anemia 01/05/2021   GI bleed 03/18/2020   Acute lower GI bleeding 03/03/2020   Pressure injury of skin 03/03/2020   Thoracic aortic aneurysm without rupture ([place]) 11/19/2019   Hardening of the aorta (main artery of the heart) ([place]) 11/19/2019   Protein-calorie malnutrition ([place]) 06/10/2019   Avitaminosis D 03/07/2019   Male hypogonadism 07/26/2018   Gynecomastia 06/27/2018   Malignant neoplasm of upper lobe of left lung ([place]) 04/06/2018   History of malignant neoplasm of parotid gland 02/07/2018   Adenoma  of large intestine 11/09/2016   Adult hypothyroidism 09/09/2015   Chronic constipation 05/22/2015   Impetigo bullosa 10/23/2013   Chronic left shoulder pain 09/25/2013   Muscle weakness (generalized) 09/25/2013   Hypercalcemia 08/08/2013   Chronic kidney disease, stage 3 unspecified ([place]) 08/07/2013   AKI (acute kidney injury) ([place]) 07/24/2013   Acute kidney failure, unspecified ([place]) 07/24/2013   Adhesive capsulitis of left shoulder 05/14/2013   GERD (gastroesophageal reflux disease)    Carcinoma of parotid gland ([place]) 11/23/2012   Epidermoid cyst 10/05/2012   Neck pain  08/26/2012   Chronic abdominal pain 08/26/2012   Edema of lower extremity 08/24/2012   Hemiplegia of dominant side as late effect of cerebrovascular disease ([place]) 06/06/2012   Diarrhea 04/17/2012   Alimentary obesity 04/17/2012   Type 2 diabetes mellitus with diabetic neuropathy, unspecified ([place]) 02/13/2012   Recurrent boils 01/12/2012   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 ([place]) 07/15/2011   Hemorrhoids, internal 03/23/2011   Hepatitis 03/02/2011   Steatohepatitis 03/02/2011   Abdominal pain, right upper quadrant 12/16/2010   Abnormal levels of other serum enzymes 12/16/2010   Eosinophil count raised 12/23/2009   HEMATOCHEZIA 12/23/2009   Type 2 diabetes mellitus ([place]) 01/05/2009   Gout 01/05/2009   Hypertensive disorder 01/05/2009   BARRETTS ESOPHAGUS 01/05/2009   DIVERTICULOSIS OF COLON 01/05/2009   History of malignant lymphoma 01/05/2009   Home Medication(s) Prior to Admission medications   Medication Sig Start Date End Date Taking? Authorizing Provider  allopurinol (ZYLOPRIM) 300 MG tablet TAKE 1 TABLET(300 MG) BY MOUTH DAILY Patient taking differently: Take 300 mg by mouth daily. 12/10/21  Yes [name], MD  amLODipine (NORVASC) 10 MG tablet Take 1 tablet (10 mg total) by mouth daily. 12/10/21  Yes [name], MD  ASPIRIN ADULT LOW STRENGTH 81 MG EC tablet Take 81 mg by mouth daily. 02/03/22  Yes [provider]  cinacalcet (SENSIPAR) 30 MG tablet Take 30 mg by mouth daily with breakfast. 02/03/22  Yes [provider]  cloNIDine (CATAPRES) 0.2 MG tablet Take 1 tablet (0.2 mg total) by mouth 3 (three) times daily. TAKE 1 TABLET(0.2 MG) BY MOUTH THREE TIMES DAILY Patient taking differently: Take 0.1 mg by mouth 2 (two) times daily. 12/10/21  Yes [name], MD  furosemide (LASIX) 80 MG tablet TAKE (1) TABLET BY MOUTH ONCE DAILY. Patient not taking: Reported on 07/14/2022 07/01/22   [name], MD  gabapentin (NEURONTIN)  300 MG capsule Take 2 capsules (600 mg total) by mouth 2 (two) times daily. May also take 2 capsules (600 mg total) daily as needed. Patient taking differently: 600MG Twice a day 12/10/21  Yes [name], MD  insulin glargine (LANTUS SOLOSTAR) 100 UNIT/ML Solostar Pen Inject 30 Units into the skin at bedtime. Patient taking differently: Inject 15 Units into the skin at bedtime. 03/04/21  Yes Reardon, Juanetta Beets, NP  iron polysaccharides (NIFEREX) 150 MG capsule TAKE 1 CAPSULE(150 MG) BY MOUTH DAILY Patient taking differently: Take 150 mg by mouth daily. 07/07/21  Yes [name], MD  lisinopril (ZESTRIL) 20 MG tablet TAKE 1 TABLET(20 MG) BY MOUTH DAILY Patient taking differently: Take 20 mg by mouth daily. 06/18/21  Yes [name], MD  lubiprostone (AMITIZA) 24 MCG capsule TAKE 1 CAPSULE BY MOUTH TWICE DAILY WITH MEALS Patient taking differently: Take 24 mcg by mouth 2 (two) times daily with a meal. 04/07/22  Yes [name], MD  metoprolol tartrate (LOPRESSOR) 50  MG tablet Take 50 mg by mouth daily. 12/10/21  Yes [provider]  oxyCODONE-acetaminophen (PERCOCET) 7.5-325 MG tablet Take 1 tablet by mouth every 4 (four) hours as needed. Patient taking differently: Take 1 tablet by mouth every 6 (six) hours as needed for moderate pain. 08/10/21  Yes [name], MD  pantoprazole (PROTONIX) 40 MG tablet TAKE 1 TABLET(40 MG) BY MOUTH DAILY Patient taking differently: Take 40 mg by mouth daily. 05/21/21  Yes [name], MD  simvastatin (ZOCOR) 20 MG tablet TAKE 1 TABLET(20 MG) BY MOUTH AT BEDTIME Patient taking differently: Take 20 mg by mouth daily at 6 PM. 05/21/21  Yes Pickard, Cammie Mcgee, MD  tamsulosin (FLOMAX) 0.4 MG CAPS capsule TAKE 1 CAPSULE(0.4 MG) BY MOUTH DAILY Patient taking differently: Take 0.4 mg by mouth daily. 12/10/21  Yes [name], MD  traZODone (DESYREL) 100 MG tablet Take 100 mg by mouth at bedtime. 03/07/22  Yes [provider]  buPROPion ER (WELLBUTRIN SR) 100 MG 12 hr tablet Take 100 mg by mouth 2 (two) times daily. Patient not taking: Reported on 07/14/2022 02/03/22   [provider]  glucose blood (ONETOUCH VERIO) test strip Use as directed to check Blood Glucose twice daily 11/25/20   Brita Romp, NP  Lancets (ONETOUCH DELICA PLUS [id number]) [place] USE TO TEST BLOOD SUGAR FOUR TIMES DAILY 07/28/21   Cassandria Anger, MD                                                                                                                                    Past Surgical History Past Surgical History:  Procedure Laterality Date   BIOPSY  12/01/2016   Procedure: BIOPSY;  Surgeon: Daneil Dolin, MD;  Location: [facility];  Service: Endoscopy;;  duodenum, gastric, esophagus   CHOLECYSTECTOMY     COLONOSCOPY  03/23/11   Dr. Gala Romney  pancolonic diverticula, hemorrhoids, tubular adenoma.. next tcs 03/2016   COLONOSCOPY WITH PROPOFOL N/A 12/01/2016   inadequate bowel prep precluded exam   ESOPHAGOGASTRODUODENOSCOPY  02/05/08   goblet cell metaplasia/negative for H.pylori   ESOPHAGOGASTRODUODENOSCOPY  03/23/11   Dr. Gala Romney, barretts, hiatal hernia   ESOPHAGOGASTRODUODENOSCOPY (EGD) WITH PROPOFOL N/A 12/01/2016   Dr. Gala Romney: Large amount of retained gastric contents precluded completion of the stomach. Mucosal changes were found in the stomach. Erosions and somewhat scalloped appearing mucosa present, reactive gastritis/no H pylori. Barrett's esophagus noted, no dysplasia on biopsy. Duodenal biopsies taken as well, benign, no evidence of eosinophilia.   IR FLUORO GUIDED NEEDLE PLC ASPIRATION/INJECTION LOC  12/13/2018   IR KYPHO LUMBAR INC FX REDUCE BONE BX UNI/BIL CANNULATION INC/IMAGING  12/13/2018   IR RADIOLOGY PERIPHERAL GUIDED IV START  12/13/2018   IR US GUIDE VASC ACCESS LEFT  12/13/2018   MASS BIOPSY  11/01/2012   Procedure: NECK MASS BIOPSY;  Surgeon: Ascencion Dike, MD;  Location: [facility];  Service:  ENT;   Laterality: Right;  Excisional Bx Right Neck Mass; attempted external jugular cutdown of left side   PAROTIDECTOMY  11/24/2012   Procedure: PAROTIDECTOMY;  Surgeon: Ascencion Dike, MD;  Location: [place];  Service: ENT;  Laterality: N/A;  Total parotidectomy   PLEURECTOMY     right lymph node removal Right    behind right ear   Right video-assisted thoracic surgery, pleurectomy, and pleurodesis  2011   VIDEO BRONCHOSCOPY WITH ENDOBRONCHIAL NAVIGATION N/A 04/26/2018   Procedure: VIDEO BRONCHOSCOPY WITH ENDOBRONCHIAL NAVIGATION;  Surgeon: Melrose Nakayama, MD;  Location: [facility];  Service: Thoracic;  Laterality: N/A;   Family History Family History  Problem Relation Age of Onset   Heart failure Mother    Heart failure Father    Heart failure Sister    Heart failure Son    Colon cancer Neg Hx     Social History Social History   Tobacco Use   Smoking status: Former    Packs/day: 3.00    Years: 25.00    Total pack years: 75.00    Types: Cigarettes    Quit date: 03/01/1997    Years since quitting: 25.3   Smokeless tobacco: Never  Vaping Use   Vaping Use: Never used  Substance Use Topics   Alcohol use: No   Drug use: No   Allergies Patient has no known allergies.  Review of Systems Review of Systems  Gastrointestinal:  Positive for abdominal pain and vomiting.    Physical Exam Vital Signs  I have reviewed the triage vital signs BP (!) 146/86 (BP Location: Right Wrist)   Pulse 98   Temp 98.4 F (36.9 C) (Oral)   Resp 18   Ht 6' (1.829 m)   Wt 102.1 kg   SpO2 100%   BMI 30.52 kg/m   Physical Exam Constitutional:      General: He is not in acute distress.    Appearance: Normal appearance.  HENT:     Head: Normocephalic and atraumatic.     Nose: No congestion or rhinorrhea.  Eyes:     General:        Right eye: No discharge.        Left eye: No discharge.     Extraocular Movements: Extraocular movements intact.     Pupils: Pupils are equal, round, and reactive  to light.  Cardiovascular:     Rate and Rhythm: Normal rate and regular rhythm.     Heart sounds: No murmur heard. Pulmonary:     Effort: No respiratory distress.     Breath sounds: No wheezing or rales.  Abdominal:     General: There is no distension.     Tenderness: There is abdominal tenderness.  Musculoskeletal:        General: Normal range of motion.     Cervical back: Normal range of motion.  Skin:    General: Skin is warm and dry.  Neurological:     Mental Status: He is alert.     Comments: Baseline right-sided hemiparesis.     ED Results and Treatments Labs (all labs ordered are listed, but only abnormal results are displayed) Labs Reviewed  COMPREHENSIVE METABOLIC PANEL - Abnormal; Notable for the following components:      Result Value   Glucose, Bld 163 (*)    Creatinine, Ser 1.73 (*)    Albumin 3.0 (*)    AST 9 (*)    GFR, Estimated 41 (*)    All other components  within normal limits  CBC - Abnormal; Notable for the following components:   WBC 14.6 (*)    Hemoglobin 9.7 (*)    HCT 32.4 (*)    MCV 73.0 (*)    MCH 21.8 (*)    MCHC 29.9 (*)    RDW 19.4 (*)    All other components within normal limits  URINALYSIS, ROUTINE W REFLEX MICROSCOPIC - Abnormal; Notable for the following components:   APPearance HAZY (*)    Protein, ur 100 (*)    Leukocytes,Ua TRACE (*)    Bacteria, UA FEW (*)    All other components within normal limits  LIPASE, BLOOD  CBC  BASIC METABOLIC PANEL  CBC  TYPE AND SCREEN  PREPARE RBC (CROSSMATCH)  TROPONIN I (HIGH SENSITIVITY)  TROPONIN I (HIGH SENSITIVITY)                                                                                                                          Radiology MR Abdomen W or Wo Contrast  Result Date: 07/14/2022 CLINICAL DATA:  A 76 year old male presents for evaluation of renal lesions that were discovered on a recent CT evaluation. EXAM: MRI ABDOMEN WITHOUT AND WITH CONTRAST TECHNIQUE: Multiplanar  multisequence MR imaging of the abdomen was performed both before and after the administration of intravenous contrast. CONTRAST:  72m GADAVIST GADOBUTROL 1 MMOL/ML IV SOLN COMPARISON:  Is CT angiography evaluation of the same date. FINDINGS: Lower chest: No effusion or consolidative process. Limited assessment by MRI of the lung bases. Hepatobiliary: Post cholecystectomy.  No biliary duct dilation. Portal vein is patent. Numerous tiny hepatic cysts or biliary hamartoma de, benign lesions which require no additional follow-up are scattered throughout the hepatic parenchyma. In the area of concern in the posterior RIGHT hemiliver there is no visible lesion, presumably a small transient hepatic attenuation difference on the prior CT. Pancreas: Normal intrinsic T1 signal. No ductal dilation or sign of inflammation. No focal lesion. Spleen: Normal contour of the spleen. Subtle hypointense lesion in the splenic parenchyma on T2, not visible on baseline precontrast T1 weighted imaging and faintly visible on early postcontrast imaging becoming less conspicuous on later phase imaging. Not seen in 2019 and hypointense relative to adjacent spleen on T2 weighted images at 8 mm, smaller than the measured lesion on CT. Adrenals/Urinary Tract: In the bilateral kidneys there are simple appearing renal cysts compatible with Bosniak category 1 cysts bilaterally. Largest of these cysts is present in the RIGHT kidney measuring 4.5 cm greatest axial dimension. There is no mural nodularity or internal enhancement. These are compatible with benign lesions for which no additional follow-up imaging dedicated is recommended Stomach/Bowel: No acute gastrointestinal process to the extent evaluated on this abdominal MRI with small to moderate hiatal hernia. Vascular/Lymphatic: No signs of vascular dilation. No adenopathy in the abdomen. Other:  No ascites Musculoskeletal: Compression fracture at L1 and vertebral hemangioma at L2 similar to  previous imaging. IMPRESSION: 1. Benign lesions in both  liver and kidneys which require no additional follow-up as outlined above. 2. Subtle hypointense lesion in the splenic parenchyma on T2, not visible in 2019 and hypointense relative to adjacent spleen on T2 weighted images. This may represent a benign lesion such as a splenic hamartoma or hemangioma. The possibility of sclerosing angiomatous nodular transformation of the spleen is considered. Consider six-month follow-up to ensure stability. 3. Small to moderate hiatal hernia. Electronically Signed   By: Zetta Bills M.D.   On: 07/14/2022 14:29   CT ANGIO ABDOMEN PELVIS  W &/OR WO CONTRAST  Result Date: 07/14/2022 CLINICAL DATA:  76 year old coffee-ground emesis. Right abdominal pain. EXAM: CTA ABDOMEN AND PELVIS WITHOUT AND WITH CONTRAST TECHNIQUE: Multidetector CT imaging of the abdomen and pelvis was performed using the standard protocol during bolus administration of intravenous contrast. Multiplanar reconstructed images and MIPs were obtained and reviewed to evaluate the vascular anatomy. RADIATION DOSE REDUCTION: This exam was performed according to the departmental dose-optimization program which includes automated exposure control, adjustment of the mA and/or kV according to patient size and/or use of iterative reconstruction technique. CONTRAST:  158m OMNIPAQUE IOHEXOL 350 MG/ML SOLN COMPARISON:  CT abdomen and pelvis 03/03/2020 FINDINGS: VASCULAR Aorta: Atherosclerotic calcifications in the infrarenal abdominal aorta. Negative for an abdominal aortic aneurysm. No evidence for dissection or stenosis in the abdominal aorta. Celiac: Patent without evidence of aneurysm, dissection, vasculitis or significant stenosis. SMA: Patent without evidence of aneurysm, dissection, vasculitis or significant stenosis. Renals: Both renal arteries are patent without evidence of aneurysm, dissection, vasculitis, fibromuscular dysplasia or significant stenosis. IMA:  Patent Inflow: Atherosclerotic calcifications involving bilateral iliac arteries. Right common and external iliac arteries are patent without significant stenosis. Stenosis at the origin of the right internal iliac artery. At least mild stenosis in the distal left common iliac artery. Left internal iliac artery is patent. At least mild narrowing in the proximal left external iliac artery. Proximal Outflow: Proximal femoral arteries are patent bilaterally. Veins: IVC and iliac veins are patent. Portal venous system is patent. Review of the MIP images confirms the above findings. NON-VASCULAR Lower chest: Bronchiectasis in the right lower lobe. No pleural effusions. Hepatobiliary: Subtle small hypodensities in liver probably represent small cysts but too small to definitively characterize. Peripheral hypervascular area in the right hepatic lobe on the arterial phase imaging on sequence 5 image 48. This is probably related to transient hepatic attenuation difference. Subtle enhancing exophytic area or lesion in segment 4 B on sequence 5 image 68 that measures roughly 0.8 cm. Gallbladder has been removed. Pancreas: Unremarkable. No pancreatic ductal dilatation or surrounding inflammatory changes. Spleen: Normal in size. 1.5 cm hypodensity along the superior aspect of the spleen which is not clearly identified on the portal venous phase. This is probably an incidental finding but indeterminate. Adrenals/Urinary Tract: Chronic bilateral adrenal gland thickening which may represent hyperplasia. Evidence for numerous bilateral renal cysts. Small hyperdensities along the right kidney lower pole could represent a complex or hyperdense cyst but difficult to characterize due to small size. 7 mm exophytic hypodensity in the right kidney lower pole on sequence 9 image 58 has Hounsfield units of around 41 on noncontrast images and Hounsfield units are 62 on the portal venous phase imaging. Difficult to exclude minimal enhancement  in this area. There also hyperdense structures involving the left kidney. Negative for kidney stones or hydronephrosis. No ureter calcifications. Minimal enhancement or wall thickening along the posterolateral aspect of the bladder near the right ureterovesical junction on sequence 6, image 74.  Stomach/Bowel: Rectum is distended with stool. Large amount of stool throughout the colon. Prominent colonic diverticula involving the descending colon and sigmoid colon. Normal appendix without inflammatory changes. Normal appearance of the stomach. No evidence for bowel obstruction. No focal bowel inflammation. No evidence for active GI bleeding. Lymphatic: No lymph node enlargement in the abdomen or pelvis. Reproductive: Prostate is slightly enlarged. Other: Probable bilateral small inguinal hernias containing fat. Small periumbilical hernia containing fat. Negative for free fluid. Negative for free air. Musculoskeletal: Bone cement within the L1 vertebral body and evidence of previous vertebral body compression fracture. Again noted is heterogeneity throughout the L2 vertebral body with prominent inferior Schmorl's node. Progressive degenerative endplate changes at T0-W4. No acute bone abnormality. IMPRESSION: VASCULAR 1. No evidence for active GI bleeding. 2. Atherosclerotic disease involving the aorta and iliac arteries. At least mild stenosis in left common iliac artery and left external iliac artery as described. NON-VASCULAR 1. Large amount of stool throughout the colon, particularly in the rectum. No evidence for acute bowel inflammation. 2. Indeterminate findings involving the liver, kidneys and spleen. Hypervascular areas in the liver could represent transient attenuation differences but indeterminate. There are multiple bilateral renal cysts but some of these presumed cysts are hyperdense and concern for mild enhancement involving a right kidney lower pole hyperdense structure as described. There is also an  indeterminate splenic lesion on the arterial phase imaging. Recommend further characterization of these indeterminate abdominal findings with an abdominal MRI, with and without contrast. 3. Subtle enhancement and question mild wall thickening along the right posterolateral bladder near the right ureterovesical junction. This finding is nonspecific and consider non emergent urology consultation. Electronically Signed   By: Markus Daft M.D.   On: 07/14/2022 13:11    Pertinent labs & imaging results that were available during my care of the patient were reviewed by me and considered in my medical decision making (see MDM for details).  Medications Ordered in ED Medications  iohexol (OMNIPAQUE) 300 MG/ML solution 100 mL (has no administration in time range)  pantoprozole (PROTONIX) 80 mg /NS 100 mL infusion (8 mg/hr Intravenous New Bag/Given 07/14/22 1732)  pantoprazole (PROTONIX) injection 40 mg (has no administration in time range)  0.9 %  sodium chloride infusion (Manually program via Guardrails IV Fluids) (has no administration in time range)  allopurinol (ZYLOPRIM) tablet 300 mg (300 mg Oral Given 07/14/22 1734)  oxyCODONE-acetaminophen (PERCOCET/ROXICET) 5-325 MG per tablet 1.5 tablet (has no administration in time range)  amLODipine (NORVASC) tablet 10 mg (has no administration in time range)  cloNIDine (CATAPRES) tablet 0.1 mg (has no administration in time range)  lisinopril (ZESTRIL) tablet 20 mg (20 mg Oral Given 07/14/22 1735)  simvastatin (ZOCOR) tablet 20 mg (20 mg Oral Given 07/14/22 1735)  cinacalcet (SENSIPAR) tablet 30 mg (has no administration in time range)  insulin glargine-yfgn (SEMGLEE) injection 10 Units (has no administration in time range)  tamsulosin (FLOMAX) capsule 0.4 mg (0.4 mg Oral Given 07/14/22 1735)  0.9 %  sodium chloride infusion ( Intravenous New Bag/Given 07/14/22 1734)  acetaminophen (TYLENOL) tablet 650 mg (has no administration in time range)    Or  acetaminophen  (TYLENOL) suppository 650 mg (has no administration in time range)  ondansetron (ZOFRAN) tablet 4 mg (has no administration in time range)    Or  ondansetron (ZOFRAN) injection 4 mg (has no administration in time range)  metoprolol tartrate (LOPRESSOR) tablet 50 mg (has no administration in time range)  polyethylene glycol (MIRALAX / GLYCOLAX) packet 17  g (has no administration in time range)  senna-docusate (Senokot-S) tablet 2 tablet (has no administration in time range)  iohexol (OMNIPAQUE) 350 MG/ML injection 100 mL (100 mLs Intravenous Contrast Given 07/14/22 1115)  gadobutrol (GADAVIST) 1 MMOL/ML injection 10 mL (10 mLs Intravenous Contrast Given 07/14/22 1410)  pantoprazole (PROTONIX) 80 mg /NS 100 mL IVPB (0 mg Intravenous Stopped 07/14/22 1626)                                                                                                                                     Procedures Procedures  (including critical care time)  Medical Decision Making / ED Course   This patient presents to the ED for concern of coffee-ground emesis, abdominal pain, this involves an extensive number of treatment options, and is a complaint that carries with it a high risk of complications and morbidity.  The differential diagnosis includes upper GI bleed, lower GI bleed, esophageal cancer, Barrett's esophagus, gastritis, PUD  MDM: Patient seen emergency room for evaluation of abdominal pain and coffee-ground emesis.  Physical exam with baseline right-sided hemiparesis and mild epigastric tenderness palpation.  Laboratory evaluation with a leukocytosis to 14.6, hemoglobin 9.7 with an MCV of 73 that is only slightly downtrending from 9 months ago with last hemoglobin 11.8.  Unclear if this is secondary to persistent iron deficiency anemia or current acute blood loss anemia.  Creatinine 1.73 which is near baseline for this patient.  Patient started on pantoprazole and a CT angio abdomen pelvis was obtained that  shows no active GI bleed, mild stenosis in the left common iliac artery and left external iliac artery, large volume stool throughout the colon and indeterminate findings involving the liver, kidneys and spleen with a recommendation for an abdominal MRI with and without contrast.  This MRI was obtained that shows benign lesions in both the liver and kidneys as well as subtle hypointense lesion in the splenic parenchyma on T2 which is likely a benign hemangioma.  Spoke with Dr. Abbey Chatters of GI who recommends hospital admission for EGD tomorrow in the setting of downtrending hemoglobin and an elderly patient with coffee-ground emesis.  Patient admitted to medicine.   Additional history obtained: -Additional history obtained from wife -External records from outside source obtained and reviewed including: Chart review including previous notes, labs, imaging, consultation notes   Lab Tests: -I ordered, reviewed, and interpreted labs.   The pertinent results include:   Labs Reviewed  COMPREHENSIVE METABOLIC PANEL - Abnormal; Notable for the following components:      Result Value   Glucose, Bld 163 (*)    Creatinine, Ser 1.73 (*)    Albumin 3.0 (*)    AST 9 (*)    GFR, Estimated 41 (*)    All other components within normal limits  CBC - Abnormal; Notable for the following components:   WBC 14.6 (*)    Hemoglobin 9.7 (*)  HCT 32.4 (*)    MCV 73.0 (*)    MCH 21.8 (*)    MCHC 29.9 (*)    RDW 19.4 (*)    All other components within normal limits  URINALYSIS, ROUTINE W REFLEX MICROSCOPIC - Abnormal; Notable for the following components:   APPearance HAZY (*)    Protein, ur 100 (*)    Leukocytes,Ua TRACE (*)    Bacteria, UA FEW (*)    All other components within normal limits  LIPASE, BLOOD  CBC  BASIC METABOLIC PANEL  CBC  TYPE AND SCREEN  PREPARE RBC (CROSSMATCH)  TROPONIN I (HIGH SENSITIVITY)  TROPONIN I (HIGH SENSITIVITY)      EKG   EKG Interpretation  Date/Time:  Thursday  July 14 2022 09:09:23 EDT Ventricular Rate:  91 PR Interval:  193 QRS Duration: 86 QT Interval:  349 QTC Calculation: 430 R Axis:   27 Text Interpretation: Sinus rhythm Confirmed by [place] ([id number]) on 07/14/2022 8:22:13 PM         Imaging Studies ordered: I ordered imaging studies including CT angio abdomen pelvis, MRI abdomen pelvis I independently visualized and interpreted imaging. I agree with the radiologist interpretation   Medicines ordered and prescription drug management: Meds ordered this encounter  Medications   iohexol (OMNIPAQUE) 300 MG/ML solution 100 mL   iohexol (OMNIPAQUE) 350 MG/ML injection 100 mL   gadobutrol (GADAVIST) 1 MMOL/ML injection 10 mL   pantoprazole (PROTONIX) 80 mg /NS 100 mL IVPB   pantoprozole (PROTONIX) 80 mg /NS 100 mL infusion   pantoprazole (PROTONIX) injection 40 mg   0.9 %  sodium chloride infusion (Manually program via Guardrails IV Fluids)   allopurinol (ZYLOPRIM) tablet 300 mg    TAKE 1 TABLET(300 MG) BY MOUTH DAILY     oxyCODONE-acetaminophen (PERCOCET/ROXICET) 5-325 MG per tablet 1.5 tablet   amLODipine (NORVASC) tablet 10 mg   cloNIDine (CATAPRES) tablet 0.1 mg    TAKE 1 TABLET(0.2 MG) BY MOUTH THREE TIMES DAILY     lisinopril (ZESTRIL) tablet 20 mg    TAKE 1 TABLET(20 MG) BY MOUTH DAILY     simvastatin (ZOCOR) tablet 20 mg    TAKE 1 TABLET(20 MG) BY MOUTH AT BEDTIME     cinacalcet (SENSIPAR) tablet 30 mg   insulin glargine-yfgn (SEMGLEE) injection 10 Units   tamsulosin (FLOMAX) capsule 0.4 mg    TAKE 1 CAPSULE(0.4 MG) BY MOUTH DAILY     0.9 %  sodium chloride infusion   OR Linked Order Group    acetaminophen (TYLENOL) tablet 650 mg    acetaminophen (TYLENOL) suppository 650 mg   OR Linked Order Group    ondansetron (ZOFRAN) tablet 4 mg    ondansetron (ZOFRAN) injection 4 mg   metoprolol tartrate (LOPRESSOR) tablet 50 mg   polyethylene glycol (MIRALAX / GLYCOLAX) packet 17 g   senna-docusate (Senokot-S) tablet 2  tablet    -I have reviewed the patients home medicines and have made adjustments as needed  Critical interventions none  Consultations Obtained: I requested consultation with the gastroenterologist,  and discussed lab and imaging findings as well as pertinent plan - they recommend: Hospital admission for EGD tomorrow   Cardiac Monitoring: The patient was maintained on a cardiac monitor.  I personally viewed and interpreted the cardiac monitored which showed an underlying rhythm of: NSR  Social Determinants of Health:  Factors impacting patients care include: Currently living in a skilled nursing facility   Reevaluation: After the interventions noted above, I reevaluated the patient  and found that they have :improved  Co morbidities that complicate the patient evaluation  Past Medical History:  Diagnosis Date   Arthritis    Asthma    "hx of"   Barrett's esophagus    EGD 03/23/2011 & EGD 2/09 bx proven   BPH (benign prostatic hyperplasia)    Cancer of parotid gland ([place]) 11/23/12   Adenocarcinoma   Chronic abdominal pain    Chronic constipation    Colon polyp 03/23/2011   tubular adenoma, Dr. 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   Hemorrhoids, internal 03/23/2011   tcs by Dr. Gala Romney   Hepatitis    esosiniphilic, tx with prednisone   Hiatal hernia    History of radiation therapy 05/21/18- 05/30/18   Left Lung/ 54 Gy delivered in 3 fractions of 18 Gy. SBRT   HTN (hypertension)    Hx of radiation therapy 1974   right base of skull  area-lymphoma   Hyperlipidemia    Lower facial weakness    Right   Lymphoma ([place]) 1974   XRT at [facility], right base of skull area   Neuropathy    Non-small cell lung cancer ([place]) dx'd 04/26/18   Peripheral edema    R>L legs   Rash    chronic, recurrent, R>L legs   Renal insufficiency    Steatohepatitis    liver biopsy 2009   Stroke [facility]) 1998   right hemiparesis/plegia      Dispostion: I considered admission for this patient, and due to coffee-ground emesis with need for EGD tomorrow, patient will require hospital admission.     Final Clinical Impression(s) / ED Diagnoses Final diagnoses:  Gastrointestinal hemorrhage, unspecified gastrointestinal hemorrhage type     @PCDICTATION @    Teressa Lower, MD 07/14/22 2023

## 2022-07-14 NOTE — H&P (Signed)
TRH H&P   Patient Demographics:    [name], is a 76 y.o. male  MRN: [id number]   DOB - [birth year]  Admit Date - 07/14/2022  Outpatient Primary MD for the patient is Hal Morales, DO  Referring MD/NP/PA: Dr Matilde Sprang   Patient coming from: [facility]  Chief Complaint  Patient presents with   Abdominal Pain      HPI:    [name]  is a 76 y.o. male, with a past medical history significant for Barret's esophagus, BPH, parotid gland cancer (adenocarcinoma), chronic abdominal pain, history of CVA with residual right hemiparesis and dysarthria, delayed gastric emptying, type 2 diabetes, GERD, hyperlipidemia and diverticulosis. -Patient presents to ED secondary to complaints of coffee-ground emesis episode x1, patient reports she is on aspirin, denies any NSAIDs use, presents with 1 episode of coffee-ground emesis, some nausea, currently denies any further nausea, no diarrhea, has chronic constipation and abdominal pain, no fever or chills, no epistaxis, no chest pain or shortness of breath, she is recently moved to [place] given limited mobility is likely from right-sided hemiparesis from previous CVA. - in ED creatinine was at baseline 1.73, hemoglobin is 9.7, down from 11.8 last year, signs are stable, ED discussed with GI who recommended clear liquids for today, and endoscopy in a.m., [facility]ist consulted to admit.   Review of systems:      A full 10 point Review of Systems was done, except as stated above, all other Review of Systems were negative.   With Past History of the following :    Past Medical History:  Diagnosis Date   Arthritis    Asthma    "hx of"   Barrett's esophagus    EGD 03/23/2011 & EGD 2/09 bx proven   BPH (benign prostatic hyperplasia)    Cancer of parotid gland ([place]) 11/23/12   Adenocarcinoma   Chronic abdominal  pain    Chronic constipation    Colon polyp 03/23/2011   tubular adenoma, Dr. Gala Romney   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 ([place]) 07/15/2011   CVA (cerebral infarction) 1998   right sided deficit   Delayed gastric emptying 2018   Diverticulosis    TCS 03/23/11 pancolonic diverticula &TCS 5/08, pancolonic diverticula   DM (diabetes mellitus) ([place])    Edema of lower extremity 12/21/12   bilateral    Esotropia of left eye    GERD (gastroesophageal reflux disease)    Glaucoma (increased eye pressure)    Gout    Gout    Hemorrhagic colitis 06/06/2012.   Hemorrhoids, internal 03/23/2011   tcs by Dr. Gala Romney   Hepatitis    esosiniphilic, tx with prednisone   Hiatal hernia    History of radiation therapy 05/21/18- 05/30/18   Left Lung/ 54 Gy delivered in 3 fractions of 18 Gy. SBRT   HTN (hypertension)  Hx of radiation therapy 1974   right base of skull area-lymphoma   Hyperlipidemia    Lower facial weakness    Right   Lymphoma ([place]) 1974   XRT at [facility], right base of skull area   Neuropathy    Non-small cell lung cancer ([place]) dx'd 04/26/18   Peripheral edema    R>L legs   Rash    chronic, recurrent, R>L legs   Renal insufficiency    Steatohepatitis    liver biopsy 2009   Stroke [facility]) 1998   right hemiparesis/plegia      Past Surgical History:  Procedure Laterality Date   BIOPSY  12/01/2016   Procedure: BIOPSY;  Surgeon: Daneil Dolin, MD;  Location: [facility];  Service: Endoscopy;;  duodenum, gastric, esophagus   CHOLECYSTECTOMY     COLONOSCOPY  03/23/11   Dr. Gala Romney  pancolonic diverticula, hemorrhoids, tubular adenoma.. next tcs 03/2016   COLONOSCOPY WITH PROPOFOL N/A 12/01/2016   inadequate bowel prep precluded exam   ESOPHAGOGASTRODUODENOSCOPY  02/05/08   goblet cell metaplasia/negative for H.pylori   ESOPHAGOGASTRODUODENOSCOPY  03/23/11   Dr. Gala Romney, barretts, hiatal hernia   ESOPHAGOGASTRODUODENOSCOPY (EGD) WITH PROPOFOL N/A 12/01/2016   Dr. Gala Romney: Large  amount of retained gastric contents precluded completion of the stomach. Mucosal changes were found in the stomach. Erosions and somewhat scalloped appearing mucosa present, reactive gastritis/no H pylori. Barrett's esophagus noted, no dysplasia on biopsy. Duodenal biopsies taken as well, benign, no evidence of eosinophilia.   IR FLUORO GUIDED NEEDLE PLC ASPIRATION/INJECTION LOC  12/13/2018   IR KYPHO LUMBAR INC FX REDUCE BONE BX UNI/BIL CANNULATION INC/IMAGING  12/13/2018   IR RADIOLOGY PERIPHERAL GUIDED IV START  12/13/2018   IR US GUIDE VASC ACCESS LEFT  12/13/2018   MASS BIOPSY  11/01/2012   Procedure: NECK MASS BIOPSY;  Surgeon: Ascencion Dike, MD;  Location: [facility] OR[facility];  Service: ENT;  Laterality: Right;  Excisional Bx Right Neck Mass; attempted external jugular cutdown of left side   PAROTIDECTOMY  11/24/2012   Procedure: PAROTIDECTOMY;  Surgeon: Ascencion Dike, MD;  Location: [facility];  Service: ENT;  Laterality: N/A;  Total parotidectomy   PLEURECTOMY     right lymph node removal Right    behind right ear   Right video-assisted thoracic surgery, pleurectomy, and pleurodesis  2011   VIDEO BRONCHOSCOPY WITH ENDOBRONCHIAL NAVIGATION N/A 04/26/2018   Procedure: VIDEO BRONCHOSCOPY WITH ENDOBRONCHIAL NAVIGATION;  Surgeon: Melrose Nakayama, MD;  Location: [facility];  Service: Thoracic;  Laterality: N/A;      Social History:     Social History   Tobacco Use   Smoking status: Former    Packs/day: 3.00    Years: 25.00    Total pack years: 75.00    Types: Cigarettes    Quit date: 03/01/1997    Years since quitting: 25.3   Smokeless tobacco: Never  Substance Use Topics   Alcohol use: No      Family History :     Family History  Problem Relation Age of Onset   Heart failure Mother    Heart failure Father    Heart failure Sister    Heart failure Son    Colon cancer Neg Hx       Home Medications:   Prior to Admission medications   Medication Sig Start Date End Date Taking? Authorizing Provider   allopurinol (ZYLOPRIM) 300 MG tablet TAKE 1 TABLET(300 MG) BY MOUTH DAILY Patient taking differently: Take 300 mg by mouth daily.  12/10/21  Yes [name], MD  amLODipine (NORVASC) 10 MG tablet Take 1 tablet (10 mg total) by mouth daily. 12/10/21  Yes [name], MD  ASPIRIN ADULT LOW STRENGTH 81 MG EC tablet Take 81 mg by mouth daily. 02/03/22  Yes [provider]  cinacalcet (SENSIPAR) 30 MG tablet Take 30 mg by mouth daily with breakfast. 02/03/22  Yes [provider]  cloNIDine (CATAPRES) 0.2 MG tablet Take 1 tablet (0.2 mg total) by mouth 3 (three) times daily. TAKE 1 TABLET(0.2 MG) BY MOUTH THREE TIMES DAILY Patient taking differently: Take 0.1 mg by mouth 2 (two) times daily. 12/10/21  Yes [name], MD  furosemide (LASIX) 80 MG tablet TAKE (1) TABLET BY MOUTH ONCE DAILY. Patient not taking: Reported on 07/14/2022 07/01/22   [name], MD  gabapentin (NEURONTIN) 300 MG capsule Take 2 capsules (600 mg total) by mouth 2 (two) times daily. May also take 2 capsules (600 mg total) daily as needed. Patient taking differently: 600MG Twice a day 12/10/21  Yes [name], MD  insulin glargine (LANTUS SOLOSTAR) 100 UNIT/ML Solostar Pen Inject 30 Units into the skin at bedtime. Patient taking differently: Inject 15 Units into the skin at bedtime. 03/04/21  Yes Reardon, Juanetta Beets, NP  iron polysaccharides (NIFEREX) 150 MG capsule TAKE 1 CAPSULE(150 MG) BY MOUTH DAILY Patient taking differently: Take 150 mg by mouth daily. 07/07/21  Yes [name], MD  lisinopril (ZESTRIL) 20 MG tablet TAKE 1 TABLET(20 MG) BY MOUTH DAILY Patient taking differently: Take 20 mg by mouth daily. 06/18/21  Yes [name], MD  lubiprostone (AMITIZA) 24 MCG capsule TAKE 1 CAPSULE BY MOUTH TWICE DAILY WITH MEALS Patient taking differently: Take 24 mcg by mouth 2 (two) times daily with a meal. 04/07/22  Yes [name], MD  metoprolol tartrate (LOPRESSOR) 50 MG  tablet Take 50 mg by mouth daily. 12/10/21  Yes [provider]  oxyCODONE-acetaminophen (PERCOCET) 7.5-325 MG tablet Take 1 tablet by mouth every 4 (four) hours as needed. Patient taking differently: Take 1 tablet by mouth every 6 (six) hours as needed for moderate pain. 08/10/21  Yes [name], MD  pantoprazole (PROTONIX) 40 MG tablet TAKE 1 TABLET(40 MG) BY MOUTH DAILY Patient taking differently: Take 40 mg by mouth daily. 05/21/21  Yes [name], MD  simvastatin (ZOCOR) 20 MG tablet TAKE 1 TABLET(20 MG) BY MOUTH AT BEDTIME Patient taking differently: Take 20 mg by mouth daily at 6 PM. 05/21/21  Yes Pickard, Cammie Mcgee, MD  tamsulosin (FLOMAX) 0.4 MG CAPS capsule TAKE 1 CAPSULE(0.4 MG) BY MOUTH DAILY Patient taking differently: Take 0.4 mg by mouth daily. 12/10/21  Yes [name], MD  traZODone (DESYREL) 100 MG tablet Take 100 mg by mouth at bedtime. 03/07/22  Yes [provider]  buPROPion ER (WELLBUTRIN SR) 100 MG 12 hr tablet Take 100 mg by mouth 2 (two) times daily. Patient not taking: Reported on 07/14/2022 02/03/22   [provider]  glucose blood (ONETOUCH VERIO) test strip Use as directed to check Blood Glucose twice daily 11/25/20   Brita Romp, NP  Lancets (ONETOUCH DELICA PLUS [id number]) MISC USE TO TEST BLOOD SUGAR FOUR TIMES DAILY 07/28/21   Cassandria Anger, MD     Allergies:    No Known Allergies   Physical Exam:   Vitals  Blood pressure (!) 168/99, pulse 91, temperature 98.7 F (37.1 C), temperature source Oral, resp. rate 18,  height 6' (1.829 m), weight 102.1 kg, SpO2 96 %.   1. General frail elderly male, laying in bed, no apparent distress  2.  Does not male, laying in bed, awake, communicative and appropriate  3.  Right upper extremity contracted, left eye proptosis oing.  4. Ears and Eyes appear Normal, Conjunctivae clear, PERRLA. Moist Oral Mucosa.  5. Supple Neck, No JVD, No cervical lymphadenopathy  appriciated, No Carotid Bruits.  6. Symmetrical Chest wall movement, Good air movement bilaterally, CTAB.  7. RRR, No Gallops, Rubs or Murmurs, No Parasternal Heave.  8. Positive Bowel Sounds, Abdomen Soft, No tenderness, No organomegaly appriciated,No rebound -guarding or rigidity.  9.  No Cyanosis, Normal Skin Turgor, No Skin Rash or Bruise.  10.  joints appear normal , no effusions, Normal ROM.  11. No Palpable Lymph Nodes in Neck or Axillae    Data Review:    CBC Recent Labs  Lab 07/14/22 0904  WBC 14.6*  HGB 9.7*  HCT 32.4*  PLT 321  MCV 73.0*  MCH 21.8*  MCHC 29.9*  RDW 19.4*   ------------------------------------------------------------------------------------------------------------------  Chemistries  Recent Labs  Lab 07/14/22 0904  NA 140  K 4.0  CL 107  CO2 28  GLUCOSE 163*  BUN 23  CREATININE 1.73*  CALCIUM 8.9  AST 9*  ALT 8  ALKPHOS 98  BILITOT 0.4   ------------------------------------------------------------------------------------------------------------------ estimated creatinine clearance is 45.6 mL/min (A) (by C-G formula based on SCr of 1.73 mg/dL (H)). ------------------------------------------------------------------------------------------------------------------ No results for input(s): "TSH", "T4TOTAL", "T3FREE", "THYROIDAB" in the last 72 hours.  Invalid input(s): "FREET3"  Coagulation profile No results for input(s): "INR", "PROTIME" in the last 168 hours. ------------------------------------------------------------------------------------------------------------------- No results for input(s): "DDIMER" in the last 72 hours. -------------------------------------------------------------------------------------------------------------------  Cardiac Enzymes No results for input(s): "CKMB", "TROPONINI", "MYOGLOBIN" in the last 168 hours.  Invalid input(s):  "CK" ------------------------------------------------------------------------------------------------------------------    Component Value Date/Time   BNP 5 09/14/2021 1318     ---------------------------------------------------------------------------------------------------------------  Urinalysis    Component Value Date/Time   COLORURINE STRAW (A) 07/15/2019 1337   APPEARANCEUR CLEAR 07/15/2019 1337   LABSPEC 1.009 07/15/2019 1337   PHURINE 7.0 07/15/2019 1337   GLUCOSEU NEGATIVE 07/15/2019 1337   HGBUR NEGATIVE 07/15/2019 1337   BILIRUBINUR NEGATIVE 07/15/2019 1337   KETONESUR NEGATIVE 07/15/2019 1337   PROTEINUR 100 (A) 07/15/2019 1337   UROBILINOGEN 0.2 06/20/2014 0135   NITRITE NEGATIVE 07/15/2019 1337   LEUKOCYTESUR NEGATIVE 07/15/2019 1337    ----------------------------------------------------------------------------------------------------------------   Imaging Results:    MR Abdomen W or Wo Contrast  Result Date: 07/14/2022 CLINICAL DATA:  A 76 year old male presents for evaluation of renal lesions that were discovered on a recent CT evaluation. EXAM: MRI ABDOMEN WITHOUT AND WITH CONTRAST TECHNIQUE: Multiplanar multisequence MR imaging of the abdomen was performed both before and after the administration of intravenous contrast. CONTRAST:  55m GADAVIST GADOBUTROL 1 MMOL/ML IV SOLN COMPARISON:  Is CT angiography evaluation of the same date. FINDINGS: Lower chest: No effusion or consolidative process. Limited assessment by MRI of the lung bases. Hepatobiliary: Post cholecystectomy.  No biliary duct dilation. Portal vein is patent. Numerous tiny hepatic cysts or biliary hamartoma de, benign lesions which require no additional follow-up are scattered throughout the hepatic parenchyma. In the area of concern in the posterior RIGHT hemiliver there is no visible lesion, presumably a small transient hepatic attenuation difference on the prior CT. Pancreas: Normal intrinsic T1  signal. No ductal dilation or sign of inflammation. No focal lesion. Spleen: Normal contour of the spleen.  Subtle hypointense lesion in the splenic parenchyma on T2, not visible on baseline precontrast T1 weighted imaging and faintly visible on early postcontrast imaging becoming less conspicuous on later phase imaging. Not seen in 2019 and hypointense relative to adjacent spleen on T2 weighted images at 8 mm, smaller than the measured lesion on CT. Adrenals/Urinary Tract: In the bilateral kidneys there are simple appearing renal cysts compatible with Bosniak category 1 cysts bilaterally. Largest of these cysts is present in the RIGHT kidney measuring 4.5 cm greatest axial dimension. There is no mural nodularity or internal enhancement. These are compatible with benign lesions for which no additional follow-up imaging dedicated is recommended Stomach/Bowel: No acute gastrointestinal process to the extent evaluated on this abdominal MRI with small to moderate hiatal hernia. Vascular/Lymphatic: No signs of vascular dilation. No adenopathy in the abdomen. Other:  No ascites Musculoskeletal: Compression fracture at L1 and vertebral hemangioma at L2 similar to previous imaging. IMPRESSION: 1. Benign lesions in both liver and kidneys which require no additional follow-up as outlined above. 2. Subtle hypointense lesion in the splenic parenchyma on T2, not visible in 2019 and hypointense relative to adjacent spleen on T2 weighted images. This may represent a benign lesion such as a splenic hamartoma or hemangioma. The possibility of sclerosing angiomatous nodular transformation of the spleen is considered. Consider six-month follow-up to ensure stability. 3. Small to moderate hiatal hernia. Electronically Signed   By: Zetta Bills M.D.   On: 07/14/2022 14:29   CT ANGIO ABDOMEN PELVIS  W &/OR WO CONTRAST  Result Date: 07/14/2022 CLINICAL DATA:  76 year old coffee-ground emesis. Right abdominal pain. EXAM: CTA ABDOMEN  AND PELVIS WITHOUT AND WITH CONTRAST TECHNIQUE: Multidetector CT imaging of the abdomen and pelvis was performed using the standard protocol during bolus administration of intravenous contrast. Multiplanar reconstructed images and MIPs were obtained and reviewed to evaluate the vascular anatomy. RADIATION DOSE REDUCTION: This exam was performed according to the departmental dose-optimization program which includes automated exposure control, adjustment of the mA and/or kV according to patient size and/or use of iterative reconstruction technique. CONTRAST:  179m OMNIPAQUE IOHEXOL 350 MG/ML SOLN COMPARISON:  CT abdomen and pelvis 03/03/2020 FINDINGS: VASCULAR Aorta: Atherosclerotic calcifications in the infrarenal abdominal aorta. Negative for an abdominal aortic aneurysm. No evidence for dissection or stenosis in the abdominal aorta. Celiac: Patent without evidence of aneurysm, dissection, vasculitis or significant stenosis. SMA: Patent without evidence of aneurysm, dissection, vasculitis or significant stenosis. Renals: Both renal arteries are patent without evidence of aneurysm, dissection, vasculitis, fibromuscular dysplasia or significant stenosis. IMA: Patent Inflow: Atherosclerotic calcifications involving bilateral iliac arteries. Right common and external iliac arteries are patent without significant stenosis. Stenosis at the origin of the right internal iliac artery. At least mild stenosis in the distal left common iliac artery. Left internal iliac artery is patent. At least mild narrowing in the proximal left external iliac artery. Proximal Outflow: Proximal femoral arteries are patent bilaterally. Veins: IVC and iliac veins are patent. Portal venous system is patent. Review of the MIP images confirms the above findings. NON-VASCULAR Lower chest: Bronchiectasis in the right lower lobe. No pleural effusions. Hepatobiliary: Subtle small hypodensities in liver probably represent small cysts but too small to  definitively characterize. Peripheral hypervascular area in the right hepatic lobe on the arterial phase imaging on sequence 5 image 48. This is probably related to transient hepatic attenuation difference. Subtle enhancing exophytic area or lesion in segment 4 B on sequence 5 image 68 that measures roughly 0.8 cm. Gallbladder  has been removed. Pancreas: Unremarkable. No pancreatic ductal dilatation or surrounding inflammatory changes. Spleen: Normal in size. 1.5 cm hypodensity along the superior aspect of the spleen which is not clearly identified on the portal venous phase. This is probably an incidental finding but indeterminate. Adrenals/Urinary Tract: Chronic bilateral adrenal gland thickening which may represent hyperplasia. Evidence for numerous bilateral renal cysts. Small hyperdensities along the right kidney lower pole could represent a complex or hyperdense cyst but difficult to characterize due to small size. 7 mm exophytic hypodensity in the right kidney lower pole on sequence 9 image 58 has Hounsfield units of around 41 on noncontrast images and Hounsfield units are 62 on the portal venous phase imaging. Difficult to exclude minimal enhancement in this area. There also hyperdense structures involving the left kidney. Negative for kidney stones or hydronephrosis. No ureter calcifications. Minimal enhancement or wall thickening along the posterolateral aspect of the bladder near the right ureterovesical junction on sequence 6, image 74. Stomach/Bowel: Rectum is distended with stool. Large amount of stool throughout the colon. Prominent colonic diverticula involving the descending colon and sigmoid colon. Normal appendix without inflammatory changes. Normal appearance of the stomach. No evidence for bowel obstruction. No focal bowel inflammation. No evidence for active GI bleeding. Lymphatic: No lymph node enlargement in the abdomen or pelvis. Reproductive: Prostate is slightly enlarged. Other: Probable  bilateral small inguinal hernias containing fat. Small periumbilical hernia containing fat. Negative for free fluid. Negative for free air. Musculoskeletal: Bone cement within the L1 vertebral body and evidence of previous vertebral body compression fracture. Again noted is heterogeneity throughout the L2 vertebral body with prominent inferior Schmorl's node. Progressive degenerative endplate changes at X5-M8. No acute bone abnormality. IMPRESSION: VASCULAR 1. No evidence for active GI bleeding. 2. Atherosclerotic disease involving the aorta and iliac arteries. At least mild stenosis in left common iliac artery and left external iliac artery as described. NON-VASCULAR 1. Large amount of stool throughout the colon, particularly in the rectum. No evidence for acute bowel inflammation. 2. Indeterminate findings involving the liver, kidneys and spleen. Hypervascular areas in the liver could represent transient attenuation differences but indeterminate. There are multiple bilateral renal cysts but some of these presumed cysts are hyperdense and concern for mild enhancement involving a right kidney lower pole hyperdense structure as described. There is also an indeterminate splenic lesion on the arterial phase imaging. Recommend further characterization of these indeterminate abdominal findings with an abdominal MRI, with and without contrast. 3. Subtle enhancement and question mild wall thickening along the right posterolateral bladder near the right ureterovesical junction. This finding is nonspecific and consider non emergent urology consultation. Electronically Signed   By: Markus Daft M.D.   On: 07/14/2022 13:11    My personal review of EKG: Rhythm NSR, Rate  91 /min, QTc 430 , no Acute ST changes   Assessment & Plan:    Principal Problem:   Coffee ground emesis Active Problems:   Type 2 diabetes mellitus (HCC)   Gout   BARRETTS ESOPHAGUS   GERD (gastroesophageal reflux disease)   Chronic constipation    MGUS (monoclonal gammopathy of unknown significance)   Coffee-ground emesis Upper GI bleed Acute blood loss anemia -Patient presents with coffee-ground emesis, most likely due to upper GI bleed, denies any NSAIDs use, but he is on aspirin. -Endoscopy in 2017 significant for Barrett's esophagus. -Had an episode of lower GI bleed in 2021 thought to be secondary to ischemic colitis, but no colonoscopy was done then -will monitor CBC  closely. -We will start empirically on Protonix drip. -GI consulted by ED, plan for endoscopy tomorrow clear liquid diet today and n.p.o. after midnight. -Continue to hold aspirin. -SCD for DVT prophylaxis  CKD stage IIIb Monoclonal gammopathy of unknown significance -Renal function at baseline, continue to monitor closely  Hypertension -Mildly elevated, continue with home medications   GERD -Continue with PPI  Hyperlipidemia -Continue with home statin  BPH -Continue with Flomax  Type 2 diabetes with nephropathy -will resume on home dose insulin at a lower dose 30> 15 units, and will keep an insulin sliding scale  History  of stroke -Right side contracted  -Resume aspirin once cleared by GI, continue with statin   Abnormal CT abdomen pelvis with abnormal lesion finding in liver, spleen and kidneys. - MRI abdomen was obtained which was reassuring  Constipation -CT abdomen and pelvis with large stool burden, started on bowel regimen   DVT Prophylaxis  - SCDs only due to GI bleed  AM Labs Ordered, also please review Full Orders  Family Communication: Admission, patients condition and plan of care including tests being ordered have been discussed with the patient and wife at bedside and son kevin by [place] indicate understanding and agree with the plan and Code Status.  Code Status DNR, has MOST form at bedside, confirmed with wife  Likely DC to  back to [facility]  Condition GUARDED    Consults called: GI    Admission status:  inpatient    Time spent in minutes : 70 minutes   Phillips Climes M.D on 07/14/2022 at 3:44 PM   [facility] - Office  [phone]

## 2022-07-14 NOTE — Consult Note (Signed)
Gastroenterology Consult   Referring Provider: No ref. provider found Primary Care Physician:  Hal Morales, DO Primary Gastroenterologist:  Perry Mount, MD   Patient ID: [name]; [id number]; [birth year]   Admit date: 07/14/2022  LOS: 0 days   Date of Consultation: 07/14/2022  Reason for Consultation:  coffee ground emesis    History of Present Illness   [name] is a 76 y.o. male with complex medical history including residual right-sided hemiparesis, hepatic steatosis, renal insufficiency, non-small cell lung cancer, lymphoma, adenocarcinoma of the parotid gland, , MGUS, Barrett's esophagus, diabetes, GERD, gastroparesis, h/o eosinophilic esophagitis treated with steroids, chronic right upper quadrant pain presenting to the ED with right-sided abdominal pain, coffee-ground emesis x1.  In the ED: Glucose 163, BUN 23, creatinine 1.73 at baseline, albumin 3, total bilirubin 0.4, alkaline phosphatase 98, AST 9, ALT 8, white blood cell count 14,600, hemoglobin 9.7 (down from 11.8 nine months prior), MCV 73, platelets 321,000, troponin I negative x2.  CT angio abdomen pelvis with and without contrast today showed at least mild stenosis in the left common iliac artery and left external iliac artery, large amount of stool throughout the colon particularly the rectum, indeterminate findings involving the liver, kidney, spleen.  Nonemergent abdominal MRI with and without contrast recommended.  Subtle enhancement and question mild wall thickening along the right posterior lateral bladder near the right ureterovesical junction felt to be nonspecific and consider nonemergent urological consultation.  MRI abdomen with and without contrast today showed benign lesions in both liver and kidneys requiring no additional follow-up.  Subtle hypointense lesion in the splenic parenchyma not visible in 2019 may represent benign lesion such as splenic hamartoma or hemangioma, possibility  of sclerosing angiomatosis nodular transformation of the spleen considered.  Consider 48-monthfollow-up.  Small to moderate hiatal hernia.  GI consult: patient is a limited historian. He reports chronic RUQ pain, worse recently. States he vomited coffee ground emesis prior to presentation.  He states currently his pain is a level 4 but sometimes "gets bad".  Complains of constipation all the time, typically has a bowel movement a couple of times per week.  States his wife said there was some blood in his stool recently.  Denies taking anything to move his bowels.  Amitiza 24 mcg twice daily on his med list.  Also on iron.  Denies heartburn.  States his appetite is good.  Denies any urinary symptoms.  Unable to elaborate regarding right upper quadrant abdominal pain.  Last attempted colonoscopy December 2017, inadequate bowel prep precluded exam.  Last complete colonoscopy April 2012, pancolonic diverticula, hemorrhoids, tubular adenoma.  Last EGD December 2017 with large amount of retained gastric contents precluding complete examination of the stomach.  Mucosal changes in the stomach, erosions and somewhat scalloped appearing mucosa present, reactive gastritis, no H. pylori.  Barrett's esophagus noted, no dysplasia.  Duodenal biopsies taken which were benign with no evidence of eosinophilia.  [facility].  He was seen in evaluation ultimately on June 2022.  Feasibility of EGD/colonoscopy felt to be difficult due to his limited mobility.  They advised noncontrast CT to look for any obvious lesions in the colon with a follow-up office visit to discuss results and if further invasive procedures needed.  Patient also was unsure whether he wanted to pursue EGD and colonoscopy.  He completed CT in June of that year but never went back for follow-up.    Prior to Admission medications   Medication Sig Start Date End Date Taking? Authorizing Provider  allopurinol (ZYLOPRIM) 300 MG tablet TAKE 1 TABLET(300 MG) BY MOUTH DAILY Patient taking differently: Take 300 mg by mouth daily. 12/10/21  Yes [name], MD  amLODipine (NORVASC) 10 MG tablet Take 1 tablet (10 mg total) by mouth daily. 12/10/21  Yes [name], MD  ASPIRIN ADULT LOW STRENGTH 81 MG EC tablet Take 81 mg by mouth daily. 02/03/22  Yes [provider]  cinacalcet (SENSIPAR) 30 MG tablet Take 30 mg by mouth daily with breakfast. 02/03/22  Yes [provider]  cloNIDine (CATAPRES) 0.2 MG tablet Take 1 tablet (0.2 mg total) by mouth 3 (three) times daily. TAKE 1 TABLET(0.2 MG) BY MOUTH THREE TIMES DAILY Patient taking differently: Take 0.1 mg by mouth 2 (two) times daily. 12/10/21  Yes [name], MD        [name], MD       Yes [name], MD  insulin glargine (LANTUS SOLOSTAR) 100 UNIT/ML Solostar Pen Inject 30 Units into the skin at bedtime. Patient taking differently: Inject 15 Units into the skin at bedtime. 03/04/21  Yes Reardon, Juanetta Beets, NP  iron polysaccharides (NIFEREX) 150 MG capsule TAKE 1 CAPSULE(150 MG) BY MOUTH DAILY Patient taking differently: Take 150 mg by mouth daily. 07/07/21  Yes [name], MD  lisinopril (ZESTRIL) 20 MG tablet TAKE 1 TABLET(20 MG) BY MOUTH DAILY Patient taking differently: Take 20 mg by mouth daily. 06/18/21  Yes [name], MD  lubiprostone (AMITIZA) 24 MCG capsule TAKE 1 CAPSULE BY MOUTH TWICE DAILY WITH MEALS Patient taking differently: Take 24 mcg by mouth 2 (two) times daily with a meal. 04/07/22  Yes [name], MD  metoprolol tartrate (LOPRESSOR) 50 MG tablet Take 50 mg by mouth daily. 12/10/21  Yes [provider]  oxyCODONE-acetaminophen (PERCOCET) 7.5-325 MG tablet Take 1 tablet by mouth every 4 (four) hours as needed. Patient taking differently: Take 1  tablet by mouth every 6 (six) hours as needed for moderate pain. 08/10/21  Yes [name], MD  pantoprazole (PROTONIX) 40 MG tablet TAKE 1 TABLET(40 MG) BY MOUTH DAILY Patient taking differently: Take 40 mg by mouth daily. 05/21/21  Yes [name], MD  simvastatin (ZOCOR) 20 MG tablet TAKE 1 TABLET(20 MG) BY MOUTH AT BEDTIME Patient taking differently: Take 20 mg by mouth daily at 6 PM. 05/21/21  Yes Pickard, Cammie Mcgee, MD  tamsulosin (FLOMAX) 0.4 MG CAPS capsule TAKE 1 CAPSULE(0.4 MG) BY MOUTH DAILY Patient taking differently: Take 0.4 mg by mouth daily. 12/10/21  Yes [name], MD  traZODone (DESYREL) 100 MG tablet Take 100 mg by mouth at bedtime. 03/07/22  Yes [provider]  buPROPion ER (WELLBUTRIN SR) 100 MG 12 hr tablet Take 100 mg by mouth 2 (two) times daily. Patient not taking: Reported on 07/14/2022 02/03/22   [provider]  glucose blood (ONETOUCH VERIO) test strip Use as directed to check Blood Glucose twice daily 11/25/20   Brita Romp, NP  Lancets (ONETOUCH DELICA PLUS [id number]) MISC USE TO TEST BLOOD SUGAR FOUR TIMES DAILY 07/28/21   Cassandria Anger, MD    Current Facility-Administered Medications  Medication Dose Route Frequency Provider Last Rate Last Admin   iohexol (OMNIPAQUE) 300 MG/ML solution 100 mL  100 mL Intravenous  Once PRN Kommor, Madison, MD       pantoprazole (PROTONIX) 80 mg /NS 100 mL IVPB  80 mg Intravenous Once Elgergawy, Silver Huguenin, MD       [START ON 07/18/2022] pantoprazole (PROTONIX) injection 40 mg  40 mg Intravenous Q12H Elgergawy, Silver Huguenin, MD       pantoprozole (PROTONIX) 80 mg /NS 100 mL infusion  8 mg/hr Intravenous Continuous Elgergawy, Silver Huguenin, MD       Current Outpatient Medications  Medication Sig Dispense Refill   allopurinol (ZYLOPRIM) 300 MG tablet TAKE 1 TABLET(300 MG) BY MOUTH DAILY (Patient taking differently: Take 300 mg by mouth daily.) 90 tablet 2   amLODipine (NORVASC) 10 MG tablet Take 1  tablet (10 mg total) by mouth daily. 90 tablet 2   ASPIRIN ADULT LOW STRENGTH 81 MG EC tablet Take 81 mg by mouth daily.     cinacalcet (SENSIPAR) 30 MG tablet Take 30 mg by mouth daily with breakfast.     cloNIDine (CATAPRES) 0.2 MG tablet Take 1 tablet (0.2 mg total) by mouth 3 (three) times daily. TAKE 1 TABLET(0.2 MG) BY MOUTH THREE TIMES DAILY (Patient taking differently: Take 0.1 mg by mouth 2 (two) times daily.) 270 tablet 2   furosemide (LASIX) 80 MG tablet TAKE (1) TABLET BY MOUTH ONCE DAILY. (Patient not taking: Reported on 07/14/2022) 30 tablet 0   gabapentin (NEURONTIN) 300 MG capsule Take 2 capsules (600 mg total) by mouth 2 (two) times daily. May also take 2 capsules (600 mg total) daily as needed. (Patient taking differently: 600MG Twice a day) 540 capsule 3   insulin glargine (LANTUS SOLOSTAR) 100 UNIT/ML Solostar Pen Inject 30 Units into the skin at bedtime. (Patient taking differently: Inject 15 Units into the skin at bedtime.) 15 mL 2   iron polysaccharides (NIFEREX) 150 MG capsule TAKE 1 CAPSULE(150 MG) BY MOUTH DAILY (Patient taking differently: Take 150 mg by mouth daily.) 90 capsule 2   lisinopril (ZESTRIL) 20 MG tablet TAKE 1 TABLET(20 MG) BY MOUTH DAILY (Patient taking differently: Take 20 mg by mouth daily.) 90 tablet 2   lubiprostone (AMITIZA) 24 MCG capsule TAKE 1 CAPSULE BY MOUTH TWICE DAILY WITH MEALS (Patient taking differently: Take 24 mcg by mouth 2 (two) times daily with a meal.) 60 capsule 5   metoprolol tartrate (LOPRESSOR) 50 MG tablet Take 50 mg by mouth daily.     oxyCODONE-acetaminophen (PERCOCET) 7.5-325 MG tablet Take 1 tablet by mouth every 4 (four) hours as needed. (Patient taking differently: Take 1 tablet by mouth every 6 (six) hours as needed for moderate pain.) 180 tablet 0   pantoprazole (PROTONIX) 40 MG tablet TAKE 1 TABLET(40 MG) BY MOUTH DAILY (Patient taking differently: Take 40 mg by mouth daily.) 90 tablet 2   simvastatin (ZOCOR) 20 MG tablet TAKE 1  TABLET(20 MG) BY MOUTH AT BEDTIME (Patient taking differently: Take 20 mg by mouth daily at 6 PM.) 90 tablet 2   tamsulosin (FLOMAX) 0.4 MG CAPS capsule TAKE 1 CAPSULE(0.4 MG) BY MOUTH DAILY (Patient taking differently: Take 0.4 mg by mouth daily.) 90 capsule 2   traZODone (DESYREL) 100 MG tablet Take 100 mg by mouth at bedtime.     buPROPion ER (WELLBUTRIN SR) 100 MG 12 hr tablet Take 100 mg by mouth 2 (two) times daily. (Patient not taking: Reported on 07/14/2022)     glucose blood (ONETOUCH VERIO) test strip Use as directed to check Blood Glucose twice daily 150 strip 5   Lancets (ONETOUCH  DELICA PLUS [id number]) MISC USE TO TEST BLOOD SUGAR FOUR TIMES DAILY 200 each 2    Allergies as of 07/14/2022   (No Known Allergies)    Past Medical History:  Diagnosis Date   Arthritis    Asthma    "hx of"   Barrett's esophagus    EGD 03/23/2011 & EGD 2/09 bx proven   BPH (benign prostatic hyperplasia)    Cancer of parotid gland ([place]) 11/23/12   Adenocarcinoma   Chronic abdominal pain    Chronic constipation    Colon polyp 03/23/2011   tubular adenoma, Dr. Gala Romney   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 ([place]) 07/15/2011   CVA (cerebral infarction) 1998   right sided deficit   Delayed gastric emptying 2018   Diverticulosis    TCS 03/23/11 pancolonic diverticula &TCS 5/08, pancolonic diverticula   DM (diabetes mellitus) ([place])    Edema of lower extremity 12/21/12   bilateral    Esotropia of left eye    GERD (gastroesophageal reflux disease)    Glaucoma (increased eye pressure)    Gout    Gout    Hemorrhagic colitis 06/06/2012.   Hemorrhoids, internal 03/23/2011   tcs by Dr. Gala Romney   Hepatitis    esosiniphilic, tx with prednisone   Hiatal hernia    History of radiation therapy 05/21/18- 05/30/18   Left Lung/ 54 Gy delivered in 3 fractions of 18 Gy. SBRT   HTN (hypertension)    Hx of radiation therapy 1974   right base of skull area-lymphoma   Hyperlipidemia    Lower facial weakness     Right   Lymphoma ([place]) 1974   XRT at [facility], right base of skull area   Neuropathy    Non-small cell lung cancer ([place]) dx'd 04/26/18   Peripheral edema    R>L legs   Rash    chronic, recurrent, R>L legs   Renal insufficiency    Steatohepatitis    liver biopsy 2009   Stroke [facility]) 1998   right hemiparesis/plegia    Past Surgical History:  Procedure Laterality Date   BIOPSY  12/01/2016   Procedure: BIOPSY;  Surgeon: Daneil Dolin, MD;  Location: [facility] END[facility] SUITE;  Service: Endoscopy;;  duodenum, gastric, esophagus   CHOLECYSTECTOMY     COLONOSCOPY  03/23/11   Dr. Gala Romney  pancolonic diverticula, hemorrhoids, tubular adenoma.. next tcs 03/2016   COLONOSCOPY WITH PROPOFOL N/A 12/01/2016   inadequate bowel prep precluded exam   ESOPHAGOGASTRODUODENOSCOPY  02/05/08   goblet cell metaplasia/negative for H.pylori   ESOPHAGOGASTRODUODENOSCOPY  03/23/11   Dr. Gala Romney, barretts, hiatal hernia   ESOPHAGOGASTRODUODENOSCOPY (EGD) WITH PROPOFOL N/A 12/01/2016   Dr. Gala Romney: Large amount of retained gastric contents precluded completion of the stomach. Mucosal changes were found in the stomach. Erosions and somewhat scalloped appearing mucosa present, reactive gastritis/no H pylori. Barrett's esophagus noted, no dysplasia on biopsy. Duodenal biopsies taken as well, benign, no evidence of eosinophilia.   IR FLUORO GUIDED NEEDLE PLC ASPIRATION/INJECTION LOC  12/13/2018   IR KYPHO LUMBAR INC FX REDUCE BONE BX UNI/BIL CANNULATION INC/IMAGING  12/13/2018   IR RADIOLOGY PERIPHERAL GUIDED IV START  12/13/2018   IR US GUIDE VASC ACCESS LEFT  12/13/2018   MASS BIOPSY  11/01/2012   Procedure: NECK MASS BIOPSY;  Surgeon: Ascencion Dike, MD;  Location: [facility] ORS;  Service: ENT;  Laterality: Right;  Excisional Bx Right Neck Mass; attempted external jugular cutdown of left side   PAROTIDECTOMY  11/24/2012   Procedure:  PAROTIDECTOMY;  Surgeon: Ascencion Dike, MD;  Location: [facility] OR;  Service: ENT;  Laterality: N/A;  Total parotidectomy    PLEURECTOMY     right lymph node removal Right    behind right ear   Right video-assisted thoracic surgery, pleurectomy, and pleurodesis  2011   VIDEO BRONCHOSCOPY WITH ENDOBRONCHIAL NAVIGATION N/A 04/26/2018   Procedure: VIDEO BRONCHOSCOPY WITH ENDOBRONCHIAL NAVIGATION;  Surgeon: Melrose Nakayama, MD;  Location: [place];  Service: Thoracic;  Laterality: N/A;    Family History  Problem Relation Age of Onset   Heart failure Mother    Heart failure Father    Heart failure Sister    Heart failure Son    Colon cancer Neg Hx     Social History   Socioeconomic History   Marital status: Married    Spouse name: Not on file   Number of children: 1   Years of education: Not on file   Highest education level: Not on file  Occupational History   Occupation: disabled  Tobacco Use   Smoking status: Former    Packs/day: 3.00    Years: 25.00    Total pack years: 75.00    Types: Cigarettes    Quit date: 03/01/1997    Years since quitting: 25.3   Smokeless tobacco: Never  Vaping Use   Vaping Use: Never used  Substance and Sexual Activity   Alcohol use: No   Drug use: No   Sexual activity: Not Currently  Other Topics Concern   Not on file  Social History Narrative   Not on file   Social Determinants of Health   Financial Resource Strain: Low Risk  (05/29/2020)   Overall Financial Resource Strain (CARDIA)    Difficulty of Paying Living Expenses: Not very hard  Food Insecurity: Not on file  Transportation Needs: Not on file  Physical Activity: Not on file  Stress: Not on file  Social Connections: Not on file  Intimate Partner Violence: Not At Risk (08/31/2018)   Humiliation, Afraid, Rape, and Kick questionnaire    Fear of Current or Ex-Partner: No    Emotionally Abused: No    Physically Abused: No    Sexually Abused: No     Review of System:   General: Negative for anorexia, weight loss, fever, chills, fatigue, new weakness. Eyes: Negative for vision changes.  ENT:  Negative for hoarseness, difficulty swallowing , nasal congestion. CV: Negative for chest pain, angina, palpitations, dyspnea on exertion, peripheral edema.  Respiratory: Negative for dyspnea at rest, dyspnea on exertion, cough, sputum, wheezing.  GI: See history of present illness. GU:  Negative for dysuria, hematuria, urinary incontinence, urinary frequency, nocturnal urination.  MS: Negative for joint pain, low back pain.  Derm: Negative for rash or itching.  Neuro: Negative for  abnormal sensation, seizure, frequent headaches, memory loss, confusion. Chronic right sided weakness since cva Psych: Negative for anxiety, depression, suicidal ideation, hallucinations.  Endo: Negative for unusual weight change.  Heme: Negative for bruising or bleeding. Allergy: Negative for rash or hives.      Physical Examination:   Vital signs in last 24 hours: Temp:  [98.7 F (37.1 C)] 98.7 F (37.1 C) (08/03 1315) Pulse Rate:  [82-93] 91 (08/03 1500) Resp:  [12-21] 18 (08/03 1500) BP: (140-172)/(91-146) 168/99 (08/03 1500) SpO2:  [96 %-100 %] 96 % (08/03 1500) Weight:  [102.1 kg] 102.1 kg (08/03 0901)    General: chronically ill appearing male in NAD. Alert and orient. Difficulty elaborating on history. Head:  Normocephalic, atraumatic.   Eyes: Conjunctiva pink, no icterus. Mouth: Oropharyngeal mucosa moist and pink , no lesions erythema or exudate. Neck: Supple without thyromegaly, masses, or lymphadenopathy.  Lungs: Clear to auscultation bilaterally.  Heart: Regular rate and rhythm, no murmurs rubs or gallops.  Abdomen: Bowel sounds are normal, nondistended, no hepatosplenomegaly or masses, no abdominal bruits or hernia , no rebound or guarding. Mild ruq tenderness. Rectal: not performed Extremities: trace to 1+ bilateral lower extremity edema, clubbing, deformity.  Neuro: Alert and oriented x 4 , grossly normal neurologically.  Skin: Warm and dry, no rash or jaundice.   Psych: Alert and  cooperative, normal mood and affect.        Intake/Output from previous day: No intake/output data recorded. Intake/Output this shift: No intake/output data recorded.  Lab Results:   CBC Recent Labs    07/14/22 0904  WBC 14.6*  HGB 9.7*  HCT 32.4*  MCV 73.0*  PLT 321   BMET Recent Labs    07/14/22 0904  NA 140  K 4.0  CL 107  CO2 28  GLUCOSE 163*  BUN 23  CREATININE 1.73*  CALCIUM 8.9   LFT Recent Labs    07/14/22 0904  BILITOT 0.4  ALKPHOS 98  AST 9*  ALT 8  PROT 7.3  ALBUMIN 3.0*    Lipase Recent Labs    07/14/22 0904  LIPASE 27    PT/INR No results for input(s): "LABPROT", "INR" in the last 72 hours.   Hepatitis Panel No results for input(s): "HEPBSAG", "HCVAB", "HEPAIGM", "HEPBIGM" in the last 72 hours.      Imaging Studies:   MR Abdomen W or Wo Contrast  Result Date: 07/14/2022 CLINICAL DATA:  A 76 year old male presents for evaluation of renal lesions that were discovered on a recent CT evaluation. EXAM: MRI ABDOMEN WITHOUT AND WITH CONTRAST TECHNIQUE: Multiplanar multisequence MR imaging of the abdomen was performed both before and after the administration of intravenous contrast. CONTRAST:  59m GADAVIST GADOBUTROL 1 MMOL/ML IV SOLN COMPARISON:  Is CT angiography evaluation of the same date. FINDINGS: Lower chest: No effusion or consolidative process. Limited assessment by MRI of the lung bases. Hepatobiliary: Post cholecystectomy.  No biliary duct dilation. Portal vein is patent. Numerous tiny hepatic cysts or biliary hamartoma de, benign lesions which require no additional follow-up are scattered throughout the hepatic parenchyma. In the area of concern in the posterior RIGHT hemiliver there is no visible lesion, presumably a small transient hepatic attenuation difference on the prior CT. Pancreas: Normal intrinsic T1 signal. No ductal dilation or sign of inflammation. No focal lesion. Spleen: Normal contour of the spleen. Subtle hypointense  lesion in the splenic parenchyma on T2, not visible on baseline precontrast T1 weighted imaging and faintly visible on early postcontrast imaging becoming less conspicuous on later phase imaging. Not seen in 2019 and hypointense relative to adjacent spleen on T2 weighted images at 8 mm, smaller than the measured lesion on CT. Adrenals/Urinary Tract: In the bilateral kidneys there are simple appearing renal cysts compatible with Bosniak category 1 cysts bilaterally. Largest of these cysts is present in the RIGHT kidney measuring 4.5 cm greatest axial dimension. There is no mural nodularity or internal enhancement. These are compatible with benign lesions for which no additional follow-up imaging dedicated is recommended Stomach/Bowel: No acute gastrointestinal process to the extent evaluated on this abdominal MRI with small to moderate hiatal hernia. Vascular/Lymphatic: No signs of vascular dilation. No adenopathy in the abdomen. Other:  No ascites Musculoskeletal:  Compression fracture at L1 and vertebral hemangioma at L2 similar to previous imaging. IMPRESSION: 1. Benign lesions in both liver and kidneys which require no additional follow-up as outlined above. 2. Subtle hypointense lesion in the splenic parenchyma on T2, not visible in 2019 and hypointense relative to adjacent spleen on T2 weighted images. This may represent a benign lesion such as a splenic hamartoma or hemangioma. The possibility of sclerosing angiomatous nodular transformation of the spleen is considered. Consider six-month follow-up to ensure stability. 3. Small to moderate hiatal hernia. Electronically Signed   By: Zetta Bills M.D.   On: 07/14/2022 14:29   CT ANGIO ABDOMEN PELVIS  W &/OR WO CONTRAST  Result Date: 07/14/2022 CLINICAL DATA:  76 year old coffee-ground emesis. Right abdominal pain. EXAM: CTA ABDOMEN AND PELVIS WITHOUT AND WITH CONTRAST TECHNIQUE: Multidetector CT imaging of the abdomen and pelvis was performed using the  standard protocol during bolus administration of intravenous contrast. Multiplanar reconstructed images and MIPs were obtained and reviewed to evaluate the vascular anatomy. RADIATION DOSE REDUCTION: This exam was performed according to the departmental dose-optimization program which includes automated exposure control, adjustment of the mA and/or kV according to patient size and/or use of iterative reconstruction technique. CONTRAST:  169m OMNIPAQUE IOHEXOL 350 MG/ML SOLN COMPARISON:  CT abdomen and pelvis 03/03/2020 FINDINGS: VASCULAR Aorta: Atherosclerotic calcifications in the infrarenal abdominal aorta. Negative for an abdominal aortic aneurysm. No evidence for dissection or stenosis in the abdominal aorta. Celiac: Patent without evidence of aneurysm, dissection, vasculitis or significant stenosis. SMA: Patent without evidence of aneurysm, dissection, vasculitis or significant stenosis. Renals: Both renal arteries are patent without evidence of aneurysm, dissection, vasculitis, fibromuscular dysplasia or significant stenosis. IMA: Patent Inflow: Atherosclerotic calcifications involving bilateral iliac arteries. Right common and external iliac arteries are patent without significant stenosis. Stenosis at the origin of the right internal iliac artery. At least mild stenosis in the distal left common iliac artery. Left internal iliac artery is patent. At least mild narrowing in the proximal left external iliac artery. Proximal Outflow: Proximal femoral arteries are patent bilaterally. Veins: IVC and iliac veins are patent. Portal venous system is patent. Review of the MIP images confirms the above findings. NON-VASCULAR Lower chest: Bronchiectasis in the right lower lobe. No pleural effusions. Hepatobiliary: Subtle small hypodensities in liver probably represent small cysts but too small to definitively characterize. Peripheral hypervascular area in the right hepatic lobe on the arterial phase imaging on  sequence 5 image 48. This is probably related to transient hepatic attenuation difference. Subtle enhancing exophytic area or lesion in segment 4 B on sequence 5 image 68 that measures roughly 0.8 cm. Gallbladder has been removed. Pancreas: Unremarkable. No pancreatic ductal dilatation or surrounding inflammatory changes. Spleen: Normal in size. 1.5 cm hypodensity along the superior aspect of the spleen which is not clearly identified on the portal venous phase. This is probably an incidental finding but indeterminate. Adrenals/Urinary Tract: Chronic bilateral adrenal gland thickening which may represent hyperplasia. Evidence for numerous bilateral renal cysts. Small hyperdensities along the right kidney lower pole could represent a complex or hyperdense cyst but difficult to characterize due to small size. 7 mm exophytic hypodensity in the right kidney lower pole on sequence 9 image 58 has Hounsfield units of around 41 on noncontrast images and Hounsfield units are 62 on the portal venous phase imaging. Difficult to exclude minimal enhancement in this area. There also hyperdense structures involving the left kidney. Negative for kidney stones or hydronephrosis. No ureter calcifications. Minimal enhancement or  wall thickening along the posterolateral aspect of the bladder near the right ureterovesical junction on sequence 6, image 74. Stomach/Bowel: Rectum is distended with stool. Large amount of stool throughout the colon. Prominent colonic diverticula involving the descending colon and sigmoid colon. Normal appendix without inflammatory changes. Normal appearance of the stomach. No evidence for bowel obstruction. No focal bowel inflammation. No evidence for active GI bleeding. Lymphatic: No lymph node enlargement in the abdomen or pelvis. Reproductive: Prostate is slightly enlarged. Other: Probable bilateral small inguinal hernias containing fat. Small periumbilical hernia containing fat. Negative for free fluid.  Negative for free air. Musculoskeletal: Bone cement within the L1 vertebral body and evidence of previous vertebral body compression fracture. Again noted is heterogeneity throughout the L2 vertebral body with prominent inferior Schmorl's node. Progressive degenerative endplate changes at H8-N2. No acute bone abnormality. IMPRESSION: VASCULAR 1. No evidence for active GI bleeding. 2. Atherosclerotic disease involving the aorta and iliac arteries. At least mild stenosis in left common iliac artery and left external iliac artery as described. NON-VASCULAR 1. Large amount of stool throughout the colon, particularly in the rectum. No evidence for acute bowel inflammation. 2. Indeterminate findings involving the liver, kidneys and spleen. Hypervascular areas in the liver could represent transient attenuation differences but indeterminate. There are multiple bilateral renal cysts but some of these presumed cysts are hyperdense and concern for mild enhancement involving a right kidney lower pole hyperdense structure as described. There is also an indeterminate splenic lesion on the arterial phase imaging. Recommend further characterization of these indeterminate abdominal findings with an abdominal MRI, with and without contrast. 3. Subtle enhancement and question mild wall thickening along the right posterolateral bladder near the right ureterovesical junction. This finding is nonspecific and consider non emergent urology consultation. Electronically Signed   By: Markus Daft M.D.   On: 07/14/2022 13:11  [4 week]  Assessment:   Pleasant 75 y/o male with complicated PMH significant for Barrett's esophagus, chronic ruq pain, chronic constipation, gastroparesis, h/o eosinophilic esophagitis, CVA with right sided deficit, DM, HTN, lymphoma, non-small cell lung cancer, renal insufficiency, hepatic steatosis, adenocarcinoma of parotid gland, MGUS who presents with acute on chronic RUQ pain, single episode of coffee ground  emesis. GI consulted for further management.   Coffee ground emesis: possibly due to esophagitis, gastritis in setting of GERD, gastroparesis. Nothing found on CT or MR today to explain his symptoms. Remote attempted EGD in 2017 but complete examination of stomach could not be done due to large amt of retained food. Noted to have reactive gastritis, no hpylori and Barrett's esophagus. Presenting Hgb of 9.7, down from 11.8 in 09/2021 but it is not clear that this was an acute drop without other comparisons.  Microcytic anemia: He has chronic microcytosis. History of MGUS. Need to consider other etiologies of his anemia. Chronically on iron, with last iron studies 02/2021. Last EGD/colonoscopy attempts as outlined above in 2017. Patient declined additional studies when referred to [facility] 2021/2022.   Splenic lesion:noted on imaging today, six month follow up MRI recommended.  Constipation: poorly controlled. It is not clear that he is taking amitiza 59mg bid, patient denies but he was provided RX with refills by his PCP in 03/2022. Large stool load on imaging, could be contributing to his abdominal pain.    Plan:   Clear liquids today. NPO after midnight. IV PPI BID. EGD tomorrow.  CBC with diff to check eosinophil count.  Add Amitiza 229m BID. Can adjust miralax according to response.  Follow  up MRI six months for splenic lesion.   LOS: 0 days   We would like to thank you for the opportunity to participate in the care of [name].  Laureen Ochs. Bernarda Caffey [facility] [phone] 8/3/20233:36 PM

## 2022-07-14 NOTE — H&P (View-Only) (Signed)
Gastroenterology Consult   Referring Provider: No ref. provider found Primary Care Physician:  Hal Morales, DO Primary Gastroenterologist:  Perry Mount, MD   Patient ID: [name]; [id number]; 01-Feb-1946   Admit date: 07/14/2022  LOS: 0 days   Date of Consultation: 07/14/2022  Reason for Consultation:  coffee ground emesis    History of Present Illness   [name] is a 76 y.o. male with complex medical history including residual right-sided hemiparesis, hepatic steatosis, renal insufficiency, non-small cell lung cancer, lymphoma, adenocarcinoma of the parotid gland, , MGUS, Barrett's esophagus, diabetes, GERD, gastroparesis, h/o eosinophilic esophagitis treated with steroids, chronic right upper quadrant pain presenting to the ED with right-sided abdominal pain, coffee-ground emesis x1.  In the ED: Glucose 163, BUN 23, creatinine 1.73 at baseline, albumin 3, total bilirubin 0.4, alkaline phosphatase 98, AST 9, ALT 8, white blood cell count 14,600, hemoglobin 9.7 (down from 11.8 nine months prior), MCV 73, platelets 321,000, troponin I negative x2.  CT angio abdomen pelvis with and without contrast today showed at least mild stenosis in the left common iliac artery and left external iliac artery, large amount of stool throughout the colon particularly the rectum, indeterminate findings involving the liver, kidney, spleen.  Nonemergent abdominal MRI with and without contrast recommended.  Subtle enhancement and question mild wall thickening along the right posterior lateral bladder near the right ureterovesical junction felt to be nonspecific and consider nonemergent urological consultation.  MRI abdomen with and without contrast today showed benign lesions in both liver and kidneys requiring no additional follow-up.  Subtle hypointense lesion in the splenic parenchyma not visible in 2019 may represent benign lesion such as splenic hamartoma or hemangioma, possibility  of sclerosing angiomatosis nodular transformation of the spleen considered.  Consider 81-monthfollow-up.  Small to moderate hiatal hernia.  GI consult: patient is a limited historian. He reports chronic RUQ pain, worse recently. States he vomited coffee ground emesis prior to presentation.  He states currently his pain is a level 4 but sometimes "gets bad".  Complains of constipation all the time, typically has a bowel movement a couple of times per week.  States his wife said there was some blood in his stool recently.  Denies taking anything to move his bowels.  Amitiza 24 mcg twice daily on his med list.  Also on iron.  Denies heartburn.  States his appetite is good.  Denies any urinary symptoms.  Unable to elaborate regarding right upper quadrant abdominal pain.  Last attempted colonoscopy December 2017, inadequate bowel prep precluded exam.  Last complete colonoscopy April 2012, pancolonic diverticula, hemorrhoids, tubular adenoma.  Last EGD December 2017 with large amount of retained gastric contents precluding complete examination of the stomach.  Mucosal changes in the stomach, erosions and somewhat scalloped appearing mucosa present, reactive gastritis, no H. pylori.  Barrett's esophagus noted, no dysplasia.  Duodenal biopsies taken which were benign with no evidence of eosinophilia.  [facility].  He was seen in evaluation ultimately on June 2022.  Feasibility of EGD/colonoscopy felt to be difficult due to his limited mobility.  They advised noncontrast CT to look for any obvious lesions in the colon with a follow-up office visit to discuss results and if further invasive procedures needed.  Patient also was unsure whether he wanted to pursue EGD and colonoscopy.  He completed CT in June of that year but never went back for follow-up.    Prior to Admission medications   Medication Sig Start Date End Date Taking? Authorizing Provider  allopurinol (ZYLOPRIM) 300 MG tablet TAKE 1 TABLET(300 MG) BY MOUTH DAILY Patient taking differently: Take 300 mg by mouth daily. 12/10/21  Yes [name], MD  amLODipine (NORVASC) 10 MG tablet Take 1 tablet (10 mg total) by mouth daily. 12/10/21  Yes [name], MD  ASPIRIN ADULT LOW STRENGTH 81 MG EC tablet Take 81 mg by mouth daily. 02/03/22  Yes [provider]  cinacalcet (SENSIPAR) 30 MG tablet Take 30 mg by mouth daily with breakfast. 02/03/22  Yes [provider]  cloNIDine (CATAPRES) 0.2 MG tablet Take 1 tablet (0.2 mg total) by mouth 3 (three) times daily. TAKE 1 TABLET(0.2 MG) BY MOUTH THREE TIMES DAILY Patient taking differently: Take 0.1 mg by mouth 2 (two) times daily. 12/10/21  Yes [name], MD        [name], MD       Yes [name], MD  insulin glargine (LANTUS SOLOSTAR) 100 UNIT/ML Solostar Pen Inject 30 Units into the skin at bedtime. Patient taking differently: Inject 15 Units into the skin at bedtime. 03/04/21  Yes Reardon, Juanetta Beets, NP  iron polysaccharides (NIFEREX) 150 MG capsule TAKE 1 CAPSULE(150 MG) BY MOUTH DAILY Patient taking differently: Take 150 mg by mouth daily. 07/07/21  Yes [name], MD  lisinopril (ZESTRIL) 20 MG tablet TAKE 1 TABLET(20 MG) BY MOUTH DAILY Patient taking differently: Take 20 mg by mouth daily. 06/18/21  Yes [name], MD  lubiprostone (AMITIZA) 24 MCG capsule TAKE 1 CAPSULE BY MOUTH TWICE DAILY WITH MEALS Patient taking differently: Take 24 mcg by mouth 2 (two) times daily with a meal. 04/07/22  Yes [name], MD  metoprolol tartrate (LOPRESSOR) 50 MG tablet Take 50 mg by mouth daily. 12/10/21  Yes [provider]  oxyCODONE-acetaminophen (PERCOCET) 7.5-325 MG tablet Take 1 tablet by mouth every 4 (four) hours as needed. Patient taking differently: Take 1  tablet by mouth every 6 (six) hours as needed for moderate pain. 08/10/21  Yes [name], MD  pantoprazole (PROTONIX) 40 MG tablet TAKE 1 TABLET(40 MG) BY MOUTH DAILY Patient taking differently: Take 40 mg by mouth daily. 05/21/21  Yes [name], MD  simvastatin (ZOCOR) 20 MG tablet TAKE 1 TABLET(20 MG) BY MOUTH AT BEDTIME Patient taking differently: Take 20 mg by mouth daily at 6 PM. 05/21/21  Yes Pickard, Cammie Mcgee, MD  tamsulosin (FLOMAX) 0.4 MG CAPS capsule TAKE 1 CAPSULE(0.4 MG) BY MOUTH DAILY Patient taking differently: Take 0.4 mg by mouth daily. 12/10/21  Yes [name], MD  traZODone (DESYREL) 100 MG tablet Take 100 mg by mouth at bedtime. 03/07/22  Yes [provider]  buPROPion ER (WELLBUTRIN SR) 100 MG 12 hr tablet Take 100 mg by mouth 2 (two) times daily. Patient not taking: Reported on 07/14/2022 02/03/22   [provider]  glucose blood (ONETOUCH VERIO) test strip Use as directed to check Blood Glucose twice daily 11/25/20   Brita Romp, NP  Lancets (ONETOUCH DELICA PLUS [id number]) MISC USE TO TEST BLOOD SUGAR FOUR TIMES DAILY 07/28/21   Cassandria Anger, MD    Current Facility-Administered Medications  Medication Dose Route Frequency Provider Last Rate Last Admin   iohexol (OMNIPAQUE) 300 MG/ML solution 100 mL  100 mL Intravenous  Once PRN Kommor, Madison, MD       pantoprazole (PROTONIX) 80 mg /NS 100 mL IVPB  80 mg Intravenous Once Elgergawy, Silver Huguenin, MD       [START ON 07/18/2022] pantoprazole (PROTONIX) injection 40 mg  40 mg Intravenous Q12H Elgergawy, Silver Huguenin, MD       pantoprozole (PROTONIX) 80 mg /NS 100 mL infusion  8 mg/hr Intravenous Continuous Elgergawy, Silver Huguenin, MD       Current Outpatient Medications  Medication Sig Dispense Refill   allopurinol (ZYLOPRIM) 300 MG tablet TAKE 1 TABLET(300 MG) BY MOUTH DAILY (Patient taking differently: Take 300 mg by mouth daily.) 90 tablet 2   amLODipine (NORVASC) 10 MG tablet Take 1  tablet (10 mg total) by mouth daily. 90 tablet 2   ASPIRIN ADULT LOW STRENGTH 81 MG EC tablet Take 81 mg by mouth daily.     cinacalcet (SENSIPAR) 30 MG tablet Take 30 mg by mouth daily with breakfast.     cloNIDine (CATAPRES) 0.2 MG tablet Take 1 tablet (0.2 mg total) by mouth 3 (three) times daily. TAKE 1 TABLET(0.2 MG) BY MOUTH THREE TIMES DAILY (Patient taking differently: Take 0.1 mg by mouth 2 (two) times daily.) 270 tablet 2   furosemide (LASIX) 80 MG tablet TAKE (1) TABLET BY MOUTH ONCE DAILY. (Patient not taking: Reported on 07/14/2022) 30 tablet 0   gabapentin (NEURONTIN) 300 MG capsule Take 2 capsules (600 mg total) by mouth 2 (two) times daily. May also take 2 capsules (600 mg total) daily as needed. (Patient taking differently: 600MG Twice a day) 540 capsule 3   insulin glargine (LANTUS SOLOSTAR) 100 UNIT/ML Solostar Pen Inject 30 Units into the skin at bedtime. (Patient taking differently: Inject 15 Units into the skin at bedtime.) 15 mL 2   iron polysaccharides (NIFEREX) 150 MG capsule TAKE 1 CAPSULE(150 MG) BY MOUTH DAILY (Patient taking differently: Take 150 mg by mouth daily.) 90 capsule 2   lisinopril (ZESTRIL) 20 MG tablet TAKE 1 TABLET(20 MG) BY MOUTH DAILY (Patient taking differently: Take 20 mg by mouth daily.) 90 tablet 2   lubiprostone (AMITIZA) 24 MCG capsule TAKE 1 CAPSULE BY MOUTH TWICE DAILY WITH MEALS (Patient taking differently: Take 24 mcg by mouth 2 (two) times daily with a meal.) 60 capsule 5   metoprolol tartrate (LOPRESSOR) 50 MG tablet Take 50 mg by mouth daily.     oxyCODONE-acetaminophen (PERCOCET) 7.5-325 MG tablet Take 1 tablet by mouth every 4 (four) hours as needed. (Patient taking differently: Take 1 tablet by mouth every 6 (six) hours as needed for moderate pain.) 180 tablet 0   pantoprazole (PROTONIX) 40 MG tablet TAKE 1 TABLET(40 MG) BY MOUTH DAILY (Patient taking differently: Take 40 mg by mouth daily.) 90 tablet 2   simvastatin (ZOCOR) 20 MG tablet TAKE 1  TABLET(20 MG) BY MOUTH AT BEDTIME (Patient taking differently: Take 20 mg by mouth daily at 6 PM.) 90 tablet 2   tamsulosin (FLOMAX) 0.4 MG CAPS capsule TAKE 1 CAPSULE(0.4 MG) BY MOUTH DAILY (Patient taking differently: Take 0.4 mg by mouth daily.) 90 capsule 2   traZODone (DESYREL) 100 MG tablet Take 100 mg by mouth at bedtime.     buPROPion ER (WELLBUTRIN SR) 100 MG 12 hr tablet Take 100 mg by mouth 2 (two) times daily. (Patient not taking: Reported on 07/14/2022)     glucose blood (ONETOUCH VERIO) test strip Use as directed to check Blood Glucose twice daily 150 strip 5   Lancets (ONETOUCH  DELICA PLUS [id number]) MISC USE TO TEST BLOOD SUGAR FOUR TIMES DAILY 200 each 2    Allergies as of 07/14/2022   (No Known Allergies)    Past Medical History:  Diagnosis Date   Arthritis    Asthma    "hx of"   Barrett's esophagus    EGD 03/23/2011 & EGD 2/09 bx proven   BPH (benign prostatic hyperplasia)    Cancer of parotid gland ([place]) 11/23/12   Adenocarcinoma   Chronic abdominal pain    Chronic constipation    Colon polyp 03/23/2011   tubular adenoma, Dr. Gala Romney   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 ([place]) 07/15/2011   CVA (cerebral infarction) 1998   right sided deficit   Delayed gastric emptying 2018   Diverticulosis    TCS 03/23/11 pancolonic diverticula &TCS 5/08, pancolonic diverticula   DM (diabetes mellitus) ([place])    Edema of lower extremity 12/21/12   bilateral    Esotropia of left eye    GERD (gastroesophageal reflux disease)    Glaucoma (increased eye pressure)    Gout    Gout    Hemorrhagic colitis 06/06/2012.   Hemorrhoids, internal 03/23/2011   tcs by Dr. Gala Romney   Hepatitis    esosiniphilic, tx with prednisone   Hiatal hernia    History of radiation therapy 05/21/18- 05/30/18   Left Lung/ 54 Gy delivered in 3 fractions of 18 Gy. SBRT   HTN (hypertension)    Hx of radiation therapy 1974   right base of skull area-lymphoma   Hyperlipidemia    Lower facial weakness     Right   Lymphoma ([place]) 1974   XRT at [facility], right base of skull area   Neuropathy    Non-small cell lung cancer ([place]) dx'd 04/26/18   Peripheral edema    R>L legs   Rash    chronic, recurrent, R>L legs   Renal insufficiency    Steatohepatitis    liver biopsy 2009   Stroke [facility]) 1998   right hemiparesis/plegia    Past Surgical History:  Procedure Laterality Date   BIOPSY  12/01/2016   Procedure: BIOPSY;  Surgeon: Daneil Dolin, MD;  Location: [facility];  Service: Endoscopy;;  duodenum, gastric, esophagus   CHOLECYSTECTOMY     COLONOSCOPY  03/23/11   Dr. Gala Romney  pancolonic diverticula, hemorrhoids, tubular adenoma.. next tcs 03/2016   COLONOSCOPY WITH PROPOFOL N/A 12/01/2016   inadequate bowel prep precluded exam   ESOPHAGOGASTRODUODENOSCOPY  02/05/08   goblet cell metaplasia/negative for H.pylori   ESOPHAGOGASTRODUODENOSCOPY  03/23/11   Dr. Gala Romney, barretts, hiatal hernia   ESOPHAGOGASTRODUODENOSCOPY (EGD) WITH PROPOFOL N/A 12/01/2016   Dr. Gala Romney: Large amount of retained gastric contents precluded completion of the stomach. Mucosal changes were found in the stomach. Erosions and somewhat scalloped appearing mucosa present, reactive gastritis/no H pylori. Barrett's esophagus noted, no dysplasia on biopsy. Duodenal biopsies taken as well, benign, no evidence of eosinophilia.   IR FLUORO GUIDED NEEDLE PLC ASPIRATION/INJECTION LOC  12/13/2018   IR KYPHO LUMBAR INC FX REDUCE BONE BX UNI/BIL CANNULATION INC/IMAGING  12/13/2018   IR RADIOLOGY PERIPHERAL GUIDED IV START  12/13/2018   IR US GUIDE VASC ACCESS LEFT  12/13/2018   MASS BIOPSY  11/01/2012   Procedure: NECK MASS BIOPSY;  Surgeon: Ascencion Dike, MD;  Location: [facility] ORS;  Service: ENT;  Laterality: Right;  Excisional Bx Right Neck Mass; attempted external jugular cutdown of left side   PAROTIDECTOMY  11/24/2012   Procedure:  PAROTIDECTOMY;  Surgeon: Ascencion Dike, MD;  Location[facility];  Service: ENT;  Laterality: N/A;  Total parotidectomy    PLEURECTOMY     right lymph node removal Right    behind right ear   Right video-assisted thoracic surgery, pleurectomy, and pleurodesis  2011   VIDEO BRONCHOSCOPY WITH ENDOBRONCHIAL NAVIGATION N/A 04/26/2018   Procedure: VIDEO BRONCHOSCOPY WITH ENDOBRONCHIAL NAVIGATION;  Surgeon: Melrose Nakayama, MD;  Location: [place];  Service: Thoracic;  Laterality: N/A;    Family History  Problem Relation Age of Onset   Heart failure Mother    Heart failure Father    Heart failure Sister    Heart failure Son    Colon cancer Neg Hx     Social History   Socioeconomic History   Marital status: Married    Spouse name: Not on file   Number of children: 1   Years of education: Not on file   Highest education level: Not on file  Occupational History   Occupation: disabled  Tobacco Use   Smoking status: Former    Packs/day: 3.00    Years: 25.00    Total pack years: 75.00    Types: Cigarettes    Quit date: 03/01/1997    Years since quitting: 25.3   Smokeless tobacco: Never  Vaping Use   Vaping Use: Never used  Substance and Sexual Activity   Alcohol use: No   Drug use: No   Sexual activity: Not Currently  Other Topics Concern   Not on file  Social History Narrative   Not on file   Social Determinants of Health   Financial Resource Strain: Low Risk  (05/29/2020)   Overall Financial Resource Strain (CARDIA)    Difficulty of Paying Living Expenses: Not very hard  Food Insecurity: Not on file  Transportation Needs: Not on file  Physical Activity: Not on file  Stress: Not on file  Social Connections: Not on file  Intimate Partner Violence: Not At Risk (08/31/2018)   Humiliation, Afraid, Rape, and Kick questionnaire    Fear of Current or Ex-Partner: No    Emotionally Abused: No    Physically Abused: No    Sexually Abused: No     Review of System:   General: Negative for anorexia, weight loss, fever, chills, fatigue, new weakness. Eyes: Negative for vision changes.  ENT:  Negative for hoarseness, difficulty swallowing , nasal congestion. CV: Negative for chest pain, angina, palpitations, dyspnea on exertion, peripheral edema.  Respiratory: Negative for dyspnea at rest, dyspnea on exertion, cough, sputum, wheezing.  GI: See history of present illness. GU:  Negative for dysuria, hematuria, urinary incontinence, urinary frequency, nocturnal urination.  MS: Negative for joint pain, low back pain.  Derm: Negative for rash or itching.  Neuro: Negative for  abnormal sensation, seizure, frequent headaches, memory loss, confusion. Chronic right sided weakness since cva Psych: Negative for anxiety, depression, suicidal ideation, hallucinations.  Endo: Negative for unusual weight change.  Heme: Negative for bruising or bleeding. Allergy: Negative for rash or hives.      Physical Examination:   Vital signs in last 24 hours: Temp:  [98.7 F (37.1 C)] 98.7 F (37.1 C) (08/03 1315) Pulse Rate:  [82-93] 91 (08/03 1500) Resp:  [12-21] 18 (08/03 1500) BP: (140-172)/(91-146) 168/99 (08/03 1500) SpO2:  [96 %-100 %] 96 % (08/03 1500) Weight:  [102.1 kg] 102.1 kg (08/03 0901)    General: chronically ill appearing male in NAD. Alert and orient. Difficulty elaborating on history. Head:  Normocephalic, atraumatic.   Eyes: Conjunctiva pink, no icterus. Mouth: Oropharyngeal mucosa moist and pink , no lesions erythema or exudate. Neck: Supple without thyromegaly, masses, or lymphadenopathy.  Lungs: Clear to auscultation bilaterally.  Heart: Regular rate and rhythm, no murmurs rubs or gallops.  Abdomen: Bowel sounds are normal, nondistended, no hepatosplenomegaly or masses, no abdominal bruits or hernia , no rebound or guarding. Mild ruq tenderness. Rectal: not performed Extremities: trace to 1+ bilateral lower extremity edema, clubbing, deformity.  Neuro: Alert and oriented x 4 , grossly normal neurologically.  Skin: Warm and dry, no rash or jaundice.   Psych: Alert and  cooperative, normal mood and affect.        Intake/Output from previous day: No intake/output data recorded. Intake/Output this shift: No intake/output data recorded.  Lab Results:   CBC Recent Labs    07/14/22 0904  WBC 14.6*  HGB 9.7*  HCT 32.4*  MCV 73.0*  PLT 321   BMET Recent Labs    07/14/22 0904  NA 140  K 4.0  CL 107  CO2 28  GLUCOSE 163*  BUN 23  CREATININE 1.73*  CALCIUM 8.9   LFT Recent Labs    07/14/22 0904  BILITOT 0.4  ALKPHOS 98  AST 9*  ALT 8  PROT 7.3  ALBUMIN 3.0*    Lipase Recent Labs    07/14/22 0904  LIPASE 27    PT/INR No results for input(s): "LABPROT", "INR" in the last 72 hours.   Hepatitis Panel No results for input(s): "HEPBSAG", "HCVAB", "HEPAIGM", "HEPBIGM" in the last 72 hours.      Imaging Studies:   MR Abdomen W or Wo Contrast  Result Date: 07/14/2022 CLINICAL DATA:  A 76 year old male presents for evaluation of renal lesions that were discovered on a recent CT evaluation. EXAM: MRI ABDOMEN WITHOUT AND WITH CONTRAST TECHNIQUE: Multiplanar multisequence MR imaging of the abdomen was performed both before and after the administration of intravenous contrast. CONTRAST:  23m GADAVIST GADOBUTROL 1 MMOL/ML IV SOLN COMPARISON:  Is CT angiography evaluation of the same date. FINDINGS: Lower chest: No effusion or consolidative process. Limited assessment by MRI of the lung bases. Hepatobiliary: Post cholecystectomy.  No biliary duct dilation. Portal vein is patent. Numerous tiny hepatic cysts or biliary hamartoma de, benign lesions which require no additional follow-up are scattered throughout the hepatic parenchyma. In the area of concern in the posterior RIGHT hemiliver there is no visible lesion, presumably a small transient hepatic attenuation difference on the prior CT. Pancreas: Normal intrinsic T1 signal. No ductal dilation or sign of inflammation. No focal lesion. Spleen: Normal contour of the spleen. Subtle hypointense  lesion in the splenic parenchyma on T2, not visible on baseline precontrast T1 weighted imaging and faintly visible on early postcontrast imaging becoming less conspicuous on later phase imaging. Not seen in 2019 and hypointense relative to adjacent spleen on T2 weighted images at 8 mm, smaller than the measured lesion on CT. Adrenals/Urinary Tract: In the bilateral kidneys there are simple appearing renal cysts compatible with Bosniak category 1 cysts bilaterally. Largest of these cysts is present in the RIGHT kidney measuring 4.5 cm greatest axial dimension. There is no mural nodularity or internal enhancement. These are compatible with benign lesions for which no additional follow-up imaging dedicated is recommended Stomach/Bowel: No acute gastrointestinal process to the extent evaluated on this abdominal MRI with small to moderate hiatal hernia. Vascular/Lymphatic: No signs of vascular dilation. No adenopathy in the abdomen. Other:  No ascites Musculoskeletal:  Compression fracture at L1 and vertebral hemangioma at L2 similar to previous imaging. IMPRESSION: 1. Benign lesions in both liver and kidneys which require no additional follow-up as outlined above. 2. Subtle hypointense lesion in the splenic parenchyma on T2, not visible in 2019 and hypointense relative to adjacent spleen on T2 weighted images. This may represent a benign lesion such as a splenic hamartoma or hemangioma. The possibility of sclerosing angiomatous nodular transformation of the spleen is considered. Consider six-month follow-up to ensure stability. 3. Small to moderate hiatal hernia. Electronically Signed   By: Zetta Bills M.D.   On: 07/14/2022 14:29   CT ANGIO ABDOMEN PELVIS  W &/OR WO CONTRAST  Result Date: 07/14/2022 CLINICAL DATA:  76 year old coffee-ground emesis. Right abdominal pain. EXAM: CTA ABDOMEN AND PELVIS WITHOUT AND WITH CONTRAST TECHNIQUE: Multidetector CT imaging of the abdomen and pelvis was performed using the  standard protocol during bolus administration of intravenous contrast. Multiplanar reconstructed images and MIPs were obtained and reviewed to evaluate the vascular anatomy. RADIATION DOSE REDUCTION: This exam was performed according to the departmental dose-optimization program which includes automated exposure control, adjustment of the mA and/or kV according to patient size and/or use of iterative reconstruction technique. CONTRAST:  127m OMNIPAQUE IOHEXOL 350 MG/ML SOLN COMPARISON:  CT abdomen and pelvis 03/03/2020 FINDINGS: VASCULAR Aorta: Atherosclerotic calcifications in the infrarenal abdominal aorta. Negative for an abdominal aortic aneurysm. No evidence for dissection or stenosis in the abdominal aorta. Celiac: Patent without evidence of aneurysm, dissection, vasculitis or significant stenosis. SMA: Patent without evidence of aneurysm, dissection, vasculitis or significant stenosis. Renals: Both renal arteries are patent without evidence of aneurysm, dissection, vasculitis, fibromuscular dysplasia or significant stenosis. IMA: Patent Inflow: Atherosclerotic calcifications involving bilateral iliac arteries. Right common and external iliac arteries are patent without significant stenosis. Stenosis at the origin of the right internal iliac artery. At least mild stenosis in the distal left common iliac artery. Left internal iliac artery is patent. At least mild narrowing in the proximal left external iliac artery. Proximal Outflow: Proximal femoral arteries are patent bilaterally. Veins: IVC and iliac veins are patent. Portal venous system is patent. Review of the MIP images confirms the above findings. NON-VASCULAR Lower chest: Bronchiectasis in the right lower lobe. No pleural effusions. Hepatobiliary: Subtle small hypodensities in liver probably represent small cysts but too small to definitively characterize. Peripheral hypervascular area in the right hepatic lobe on the arterial phase imaging on  sequence 5 image 48. This is probably related to transient hepatic attenuation difference. Subtle enhancing exophytic area or lesion in segment 4 B on sequence 5 image 68 that measures roughly 0.8 cm. Gallbladder has been removed. Pancreas: Unremarkable. No pancreatic ductal dilatation or surrounding inflammatory changes. Spleen: Normal in size. 1.5 cm hypodensity along the superior aspect of the spleen which is not clearly identified on the portal venous phase. This is probably an incidental finding but indeterminate. Adrenals/Urinary Tract: Chronic bilateral adrenal gland thickening which may represent hyperplasia. Evidence for numerous bilateral renal cysts. Small hyperdensities along the right kidney lower pole could represent a complex or hyperdense cyst but difficult to characterize due to small size. 7 mm exophytic hypodensity in the right kidney lower pole on sequence 9 image 58 has Hounsfield units of around 41 on noncontrast images and Hounsfield units are 62 on the portal venous phase imaging. Difficult to exclude minimal enhancement in this area. There also hyperdense structures involving the left kidney. Negative for kidney stones or hydronephrosis. No ureter calcifications. Minimal enhancement or  wall thickening along the posterolateral aspect of the bladder near the right ureterovesical junction on sequence 6, image 74. Stomach/Bowel: Rectum is distended with stool. Large amount of stool throughout the colon. Prominent colonic diverticula involving the descending colon and sigmoid colon. Normal appendix without inflammatory changes. Normal appearance of the stomach. No evidence for bowel obstruction. No focal bowel inflammation. No evidence for active GI bleeding. Lymphatic: No lymph node enlargement in the abdomen or pelvis. Reproductive: Prostate is slightly enlarged. Other: Probable bilateral small inguinal hernias containing fat. Small periumbilical hernia containing fat. Negative for free fluid.  Negative for free air. Musculoskeletal: Bone cement within the L1 vertebral body and evidence of previous vertebral body compression fracture. Again noted is heterogeneity throughout the L2 vertebral body with prominent inferior Schmorl's node. Progressive degenerative endplate changes at J2-E2. No acute bone abnormality. IMPRESSION: VASCULAR 1. No evidence for active GI bleeding. 2. Atherosclerotic disease involving the aorta and iliac arteries. At least mild stenosis in left common iliac artery and left external iliac artery as described. NON-VASCULAR 1. Large amount of stool throughout the colon, particularly in the rectum. No evidence for acute bowel inflammation. 2. Indeterminate findings involving the liver, kidneys and spleen. Hypervascular areas in the liver could represent transient attenuation differences but indeterminate. There are multiple bilateral renal cysts but some of these presumed cysts are hyperdense and concern for mild enhancement involving a right kidney lower pole hyperdense structure as described. There is also an indeterminate splenic lesion on the arterial phase imaging. Recommend further characterization of these indeterminate abdominal findings with an abdominal MRI, with and without contrast. 3. Subtle enhancement and question mild wall thickening along the right posterolateral bladder near the right ureterovesical junction. This finding is nonspecific and consider non emergent urology consultation. Electronically Signed   By: Markus Daft M.D.   On: 07/14/2022 13:11  [4 week]  Assessment:   Pleasant 76 y/o male with complicated PMH significant for Barrett's esophagus, chronic ruq pain, chronic constipation, gastroparesis, h/o eosinophilic esophagitis, CVA with right sided deficit, DM, HTN, lymphoma, non-small cell lung cancer, renal insufficiency, hepatic steatosis, adenocarcinoma of parotid gland, MGUS who presents with acute on chronic RUQ pain, single episode of coffee ground  emesis. GI consulted for further management.   Coffee ground emesis: possibly due to esophagitis, gastritis in setting of GERD, gastroparesis. Nothing found on CT or MR today to explain his symptoms. Remote attempted EGD in 2017 but complete examination of stomach could not be done due to large amt of retained food. Noted to have reactive gastritis, no hpylori and Barrett's esophagus. Presenting Hgb of 9.7, down from 11.8 in 09/2021 but it is not clear that this was an acute drop without other comparisons.  Microcytic anemia: He has chronic microcytosis. History of MGUS. Need to consider other etiologies of his anemia. Chronically on iron, with last iron studies 02/2021. Last EGD/colonoscopy attempts as outlined above in 2017. Patient declined additional studies when referred to [facility] 2021/2022.   Splenic lesion:noted on imaging today, six month follow up MRI recommended.  Constipation: poorly controlled. It is not clear that he is taking amitiza 73mg bid, patient denies but he was provided RX with refills by his PCP in 03/2022. Large stool load on imaging, could be contributing to his abdominal pain.    Plan:   Clear liquids today. NPO after midnight. IV PPI BID. EGD tomorrow.  CBC with diff to check eosinophil count.  Add Amitiza 233m BID. Can adjust miralax according to response.  Follow  up MRI six months for splenic lesion.   LOS: 0 days   We would like to thank you for the opportunity to participate in the care of [name].  Laureen Ochs. Bernarda Caffey [facility] [phone] 8/3/20233:36 PM

## 2022-07-14 NOTE — ED Notes (Signed)
Attempted to call report for room 311, nurse is busy and will cal back

## 2022-07-14 NOTE — ED Notes (Signed)
Patient transported to CT 

## 2022-07-15 ENCOUNTER — Inpatient Hospital Stay (HOSPITAL_COMMUNITY): Payer: Medicare Other | Admitting: Certified Registered Nurse Anesthetist

## 2022-07-15 ENCOUNTER — Encounter (HOSPITAL_COMMUNITY): Payer: Self-pay | Admitting: Internal Medicine

## 2022-07-15 ENCOUNTER — Encounter (HOSPITAL_COMMUNITY)
Admission: EM | Disposition: A | Discharge status: Skilled Nursing Facility | Payer: Self-pay | Source: Skilled Nursing Facility | Attending: Student

## 2022-07-15 DIAGNOSIS — K920 Hematemesis: Secondary | ICD-10-CM | POA: Diagnosis not present

## 2022-07-15 DIAGNOSIS — K317 Polyp of stomach and duodenum: Secondary | ICD-10-CM

## 2022-07-15 HISTORY — PX: BIOPSY: SHX5522

## 2022-07-15 HISTORY — PX: ESOPHAGOGASTRODUODENOSCOPY (EGD) WITH PROPOFOL: SHX5813

## 2022-07-15 LAB — CBC WITH DIFFERENTIAL/PLATELET
Abs Immature Granulocytes: 0.02 10*3/uL (ref 0.00–0.07)
Basophils Absolute: 0 10*3/uL (ref 0.0–0.1)
Basophils Relative: 1 %
Eosinophils Absolute: 0.5 10*3/uL (ref 0.0–0.5)
Eosinophils Relative: 6 %
HCT: 27.9 % — ABNORMAL LOW (ref 39.0–52.0)
Hemoglobin: 8.3 g/dL — ABNORMAL LOW (ref 13.0–17.0)
Immature Granulocytes: 0 %
Lymphocytes Relative: 14 %
Lymphs Abs: 1.1 10*3/uL (ref 0.7–4.0)
MCH: 21.8 pg — ABNORMAL LOW (ref 26.0–34.0)
MCHC: 29.7 g/dL — ABNORMAL LOW (ref 30.0–36.0)
MCV: 73.4 fL — ABNORMAL LOW (ref 80.0–100.0)
Monocytes Absolute: 0.6 10*3/uL (ref 0.1–1.0)
Monocytes Relative: 8 %
Neutro Abs: 6.1 10*3/uL (ref 1.7–7.7)
Neutrophils Relative %: 71 %
Platelets: 270 10*3/uL (ref 150–400)
RBC: 3.8 MIL/uL — ABNORMAL LOW (ref 4.22–5.81)
RDW: 19.6 % — ABNORMAL HIGH (ref 11.5–15.5)
WBC: 8.4 10*3/uL (ref 4.0–10.5)
nRBC: 0 % (ref 0.0–0.2)

## 2022-07-15 LAB — PHOSPHORUS: Phosphorus: 3.1 mg/dL (ref 2.5–4.6)

## 2022-07-15 LAB — IRON AND TIBC
Iron: 34 ug/dL — ABNORMAL LOW (ref 45–182)
Saturation Ratios: 13 % — ABNORMAL LOW (ref 17.9–39.5)
TIBC: 254 ug/dL (ref 250–450)
UIBC: 220 ug/dL

## 2022-07-15 LAB — BASIC METABOLIC PANEL
Anion gap: 4 — ABNORMAL LOW (ref 5–15)
BUN: 20 mg/dL (ref 8–23)
CO2: 27 mmol/L (ref 22–32)
Calcium: 8.9 mg/dL (ref 8.9–10.3)
Chloride: 110 mmol/L (ref 98–111)
Creatinine, Ser: 1.53 mg/dL — ABNORMAL HIGH (ref 0.61–1.24)
GFR, Estimated: 47 mL/min — ABNORMAL LOW (ref >60)
Glucose, Bld: 97 mg/dL (ref 70–99)
Potassium: 3.7 mmol/L (ref 3.5–5.1)
Sodium: 141 mmol/L (ref 135–145)

## 2022-07-15 LAB — FOLATE: Folate: 16.1 ng/mL (ref >5.9)

## 2022-07-15 LAB — GLUCOSE, CAPILLARY
Glucose-Capillary: 92 mg/dL (ref 70–99)
Glucose-Capillary: 103 mg/dL — ABNORMAL HIGH (ref 70–99)
Glucose-Capillary: 87 mg/dL (ref 70–99)
Glucose-Capillary: 94 mg/dL (ref 70–99)
Glucose-Capillary: 129 mg/dL — ABNORMAL HIGH (ref 70–99)

## 2022-07-15 LAB — HEMOGLOBIN A1C
Hgb A1c MFr Bld: 5 % (ref 4.8–5.6)
Mean Plasma Glucose: 96.8 mg/dL

## 2022-07-15 LAB — VITAMIN B12: Vitamin B-12: 124 pg/mL — ABNORMAL LOW (ref 180–914)

## 2022-07-15 LAB — MAGNESIUM: Magnesium: 1.8 mg/dL (ref 1.7–2.4)

## 2022-07-15 LAB — HEMOGLOBIN AND HEMATOCRIT, BLOOD
HCT: 31.8 % — ABNORMAL LOW (ref 39.0–52.0)
Hemoglobin: 9.5 g/dL — ABNORMAL LOW (ref 13.0–17.0)

## 2022-07-15 LAB — VITAMIN D 25 HYDROXY (VIT D DEFICIENCY, FRACTURES): Vit D, 25-Hydroxy: 44.53 ng/mL (ref 30–100)

## 2022-07-15 SURGERY — ESOPHAGOGASTRODUODENOSCOPY (EGD) WITH PROPOFOL
Anesthesia: General

## 2022-07-15 MED ORDER — PANTOPRAZOLE SODIUM 40 MG IV SOLR
40.0000 mg | Freq: Two times a day (BID) | INTRAVENOUS | Status: DC
  Administered 2022-07-15 – 2022-07-17 (×4): 40 mg via INTRAVENOUS
  Filled 2022-07-15 (×4): qty 10

## 2022-07-15 MED ORDER — LACTATED RINGERS IV SOLN: INTRAVENOUS | Status: DC | PRN

## 2022-07-15 MED ORDER — LACTATED RINGERS IV SOLN: INTRAVENOUS | Status: DC

## 2022-07-15 MED ORDER — INSULIN ASPART 100 UNIT/ML IJ SOLN
0.0000 [IU] | INTRAMUSCULAR | Status: DC
  Administered 2022-07-15 – 2022-07-17 (×3): 2 [IU] via SUBCUTANEOUS

## 2022-07-15 MED ORDER — SODIUM CHLORIDE 0.9 % IV SOLN: INTRAVENOUS | Status: DC

## 2022-07-15 MED ORDER — PROPOFOL 10 MG/ML IV BOLUS
INTRAVENOUS | Status: DC | PRN
  Administered 2022-07-15: 220 mg via INTRAVENOUS

## 2022-07-15 NOTE — Transfer of Care (Signed)
Immediate Anesthesia Transfer of Care Note  Patient: LENON KUENNEN  Procedure(s) Performed: ESOPHAGOGASTRODUODENOSCOPY (EGD) WITH PROPOFOL BIOPSY  Patient Location: PACU  Anesthesia Type:General  Level of Consciousness: awake and alert   Airway & Oxygen Therapy: Patient Spontanous Breathing  Post-op Assessment: Report given to RN and Post -op Vital signs reviewed and stable  Post vital signs: Reviewed and stable  Last Vitals:  Vitals Value Taken Time  BP 81/65 07/15/22 1730  Temp    Pulse    Resp 17 07/15/22 1734  SpO2    Vitals shown include unvalidated device data.  Last Pain:  Vitals:   07/15/22 1535  TempSrc:   PainSc: 0-No pain         Complications: No notable events documented.

## 2022-07-15 NOTE — TOC Initial Note (Signed)
Transition of Care [facility]) - Initial/Assessment Note    Patient Details  Name: [name] MRN: [id number] Date of Birth: 01-30-1946  [facility] of [facility]) CM/SW Contact:    Ihor Gully, LCSW Phone Number: 07/15/2022, 2:27 PM  Clinical Narrative:                 Patient from [facility], has been there since 07/11/22. Admitted for Coffee ground emesis. Spouse and facility agree to his return to the facility when medically stable for d/c. Ins. Auth started. Facility says he can return even if Josem Kaufmann is denied.   Expected Discharge Plan: Skilled Nursing Facility Barriers to Discharge: Continued Medical Work up   Patient Goals and CMS Choice Patient states their goals for this hospitalization and ongoing recovery are:: return to facility      Expected Discharge Plan and Services Expected Discharge Plan: [place] Acute Care Choice: [place] arrangements for the past 2 months: Single Family Home                                      Prior Living Arrangements/Services Living arrangements for the past 2 months: Single Family Home Lives with:: Facility Resident Patient language and need for interpreter reviewed:: Yes Do you feel safe going back to the place where you live?: Yes      Need for Family Participation in Patient Care: Yes (Comment) Care giver support system in place?: Yes (comment)   Criminal Activity/Legal Involvement Pertinent to Current Situation/Hospitalization: No - Comment as needed  Activities of Daily Living Home Assistive Devices/Equipment: Hoyer Lift ADL Screening (condition at time of admission) Patient's cognitive ability adequate to safely complete daily activities?: Yes Is the patient deaf or have difficulty hearing?: No Does the patient have difficulty seeing, even when wearing glasses/contacts?: Yes Does the patient have difficulty concentrating, remembering, or making decisions?: Yes Patient able  to express need for assistance with ADLs?: Yes Does the patient have difficulty dressing or bathing?: Yes Independently performs ADLs?: No Communication: Independent Dressing (OT): Needs assistance Is this a change from baseline?: Pre-admission baseline Grooming: Needs assistance Is this a change from baseline?: Pre-admission baseline Feeding: Independent with device (comment) Bathing: Needs assistance Is this a change from baseline?: Pre-admission baseline Toileting: Dependent Is this a change from baseline?: Pre-admission baseline In/Out Bed: Dependent Is this a change from baseline?: Pre-admission baseline Does the patient have difficulty walking or climbing stairs?: Yes Weakness of Legs: Both Weakness of Arms/Hands: Both  Permission Sought/Granted Permission sought to share information with : Family Supports, Customer service manager    Share Information with NAME: Terin Cragle, spouse; Debbie, facility           Emotional Assessment         Alcohol / Substance Use: Not Applicable Psych Involvement: No (comment)  Admission diagnosis:  Coffee ground emesis [K92.0] Gastrointestinal hemorrhage, unspecified gastrointestinal hemorrhage type [K92.2] Patient Active Problem List   Diagnosis Date Noted   Coffee ground emesis 07/14/2022   MGUS (monoclonal gammopathy of unknown significance) 03/03/2021   Chronic obstructive pulmonary disease ([place]) 02/17/2021   Common variable immunodeficiency ([place]) 02/17/2021   Osteoarthrosis 02/17/2021   Diverticular disease 01/05/2021   History of cerebrovascular accident 01/05/2021   Hiatal hernia 01/05/2021   Hyperlipidemia 01/05/2021   Glaucoma 01/05/2021   Esotropia of left eye 01/05/2021   Benign prostatic hyperplasia  01/05/2021   History of radiation therapy 01/05/2021   Anemia 01/05/2021   GI bleed 03/18/2020   Acute lower GI bleeding 03/03/2020   Pressure injury of skin 03/03/2020   Thoracic aortic aneurysm without  rupture ([place]) 11/19/2019   Hardening of the aorta (main artery of the heart) ([place]) 11/19/2019   Protein-calorie malnutrition ([place]) 06/10/2019   Avitaminosis D 03/07/2019   Male hypogonadism 07/26/2018   Gynecomastia 06/27/2018   Malignant neoplasm of upper lobe of left lung ([place]) 04/06/2018   History of malignant neoplasm of parotid gland 02/07/2018   Adenoma of large intestine 11/09/2016   Adult hypothyroidism 09/09/2015   Chronic constipation 05/22/2015   Impetigo bullosa 10/23/2013   Chronic left shoulder pain 09/25/2013   Muscle weakness (generalized) 09/25/2013   Hypercalcemia 08/08/2013   Chronic kidney disease, stage 3 unspecified ([place]) 08/07/2013   AKI (acute kidney injury) ([place]) 07/24/2013   Acute kidney failure, unspecified ([place]) 07/24/2013   Adhesive capsulitis of left shoulder 05/14/2013   GERD (gastroesophageal reflux disease)    Carcinoma of parotid gland ([place]) 11/23/2012   Epidermoid cyst 10/05/2012   Neck pain 08/26/2012   Chronic abdominal pain 08/26/2012   Edema of lower extremity 08/24/2012   Hemiplegia of dominant side as late effect of cerebrovascular disease ([place]) 06/06/2012   Diarrhea 04/17/2012   Alimentary obesity 04/17/2012   Type 2 diabetes mellitus with diabetic neuropathy, unspecified ([place]) 02/13/2012   Recurrent boils 01/12/2012   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 ([place]) 07/15/2011   Hemorrhoids, internal 03/23/2011   Hepatitis 03/02/2011   Steatohepatitis 03/02/2011   Abdominal pain, right upper quadrant 12/16/2010   Abnormal levels of other serum enzymes 12/16/2010   Eosinophil count raised 12/23/2009   HEMATOCHEZIA 12/23/2009   Type 2 diabetes mellitus ([place]) 01/05/2009   Gout 01/05/2009   Hypertensive disorder 01/05/2009   BARRETTS ESOPHAGUS 01/05/2009   DIVERTICULOSIS OF COLON 01/05/2009   History of malignant lymphoma 01/05/2009   PCP:  Hal Morales, DO Pharmacy:   [facility] ([facility] ) -  [place], [place] [address] [address] [place] [address] Phone: [phone] Fax: [place], [address] [place] [address] Phone: [phone] Fax: [phone]     Social Determinants of Health (SDOH) Interventions    Readmission Risk Interventions    07/15/2022    2:16 PM  Readmission Risk Prevention Plan  Transportation Screening Complete  PCP or Specialist Appt within 5-7 Days Complete

## 2022-07-15 NOTE — Progress Notes (Signed)
[facility] Progress Note  Patient: [name]    HMC:[id number]  DOA: 07/14/2022     Date of Service: the patient was seen and examined on 07/15/2022  Chief Complaint  Patient presents with   Abdominal Pain   Brief hospital course: Marik Sedore  is a 76 y.o. male, with a past medical history significant for Barret's esophagus, BPH, parotid gland cancer (adenocarcinoma), chronic abdominal pain, history of CVA with residual right hemiparesis and dysarthria, delayed gastric emptying, type 2 diabetes, GERD, hyperlipidemia and diverticulosis. -Patient presents to ED secondary to complaints of coffee-ground emesis episode x1, patient reports she is on aspirin, denies any NSAIDs use, presents with 1 episode of coffee-ground emesis, some nausea, currently denies any further nausea, no diarrhea, has chronic constipation and abdominal pain, no fever or chills, no epistaxis, no chest pain or shortness of breath, she is recently moved to [facility] given limited mobility is likely from right-sided hemiparesis from previous CVA. - in ED creatinine was at baseline 1.73, hemoglobin is 9.7, down from 11.8 last year, signs are stable, ED discussed with GI who recommended clear liquids for today, and endoscopy in a.m., [facility] consulted to admit.   Assessment and Plan: Principal Problem:   Coffee ground emesis Active Problems:   Type 2 diabetes mellitus (HCC)   Gout   BARRETTS ESOPHAGUS   GERD (gastroesophageal reflux disease)   Chronic constipation   MGUS (monoclonal gammopathy of unknown significance)     Coffee-ground emesis Upper GI bleed Acute blood loss anemia -Patient presents with coffee-ground emesis, most likely due to upper GI bleed, denies any NSAIDs use, but he is on aspirin. -Endoscopy in 2017 significant for Barrett's esophagus. -Had an episode of lower GI bleed in 2021 thought to be secondary to ischemic colitis, but no colonoscopy was done then -will monitor  CBC closely. -We will start empirically on Protonix drip. -GI consulted by ED, plan for endoscopy today, pt is NPO since MN -Continue to hold aspirin. -SCD for DVT prophylaxis Hb 9.1---8.3 continue to monitor H&H and transfuse if hemoglobin less than 7 Follow anemia work-up  CKD stage IIIb Monoclonal gammopathy of unknown significance -Renal function at baseline, continue to monitor closely   Hypertension -Mildly elevated, continue with home medications    GERD -Continue with PPI   Hyperlipidemia -Continue with home statin   BPH -Continue with Flomax   Type 2 diabetes with nephropathy -will resume on home dose insulin at a lower dose 30> 15 units, and will keep an insulin sliding scale   History  of stroke -Right side contracted  -Resume aspirin once cleared by GI, continue with statin    Abnormal CT abdomen pelvis with abnormal lesion finding in liver, spleen and kidneys. - MRI abdomen was obtained which was reassuring Splenic lesion, patient needs follow-up in 6 months, recommended to follow with PCP for further work-up as an outpatient  Constipation -CT abdomen and pelvis with large stool burden, started on bowel regimen    Body mass index is 30.52 kg/m.  Interventions:   Pressure Injury 03/03/20 Buttocks Right;Medial Stage 2 -  Partial thickness loss of dermis presenting as a shallow open injury with a red, pink wound bed without slough. (Active)  03/03/20 0930  Location: Buttocks  Location Orientation: Right;Medial  Staging: Stage 2 -  Partial thickness loss of dermis presenting as a shallow open injury with a red, pink wound bed without slough.  Wound Description (Comments):   Present on Admission: Yes  Pressure Injury 03/03/20 Hip Left Stage 2 -  Partial thickness loss of dermis presenting as a shallow open injury with a red, pink wound bed without slough. (Active)  03/03/20 0930  Location: Hip  Location Orientation: Left  Staging: Stage 2 -  Partial  thickness loss of dermis presenting as a shallow open injury with a red, pink wound bed without slough.  Wound Description (Comments):   Present on Admission: Yes     Diet: Currently n.p.o. DVT Prophylaxis: SCD, pharmacological prophylaxis contraindicated due to upper GI bleeding    Advance goals of care discussion: DNR  Family Communication: family was not present at bedside, at the time of interview.  The pt provided permission to discuss medical plan with the family. Opportunity was given to ask question and all questions were answered satisfactorily.   Disposition:  Pt is from SNF, admitted with coffee-ground emesis, upper GI bleeding, GI work-up pending, H&H is being monitored and patient is on IV PPI infusion, which precludes a safe discharge. Discharge to SNF, when medically stable, bleeding resolved and H&H remained stable, need GI clearance.  Subjective: No significant overnight events, patient was admitted due to coffee-ground emesis.  Patient is complaining of epigastric pain 4/10, no nausea vomiting during hospital stay.  Patient denies any chest pain or palpitation, no shortness of breath.   Physical Exam: General:  alert oriented to time, place, and person.  Appear in mild distress, affect appropriate Eyes: PERRLA ENT: Oral Mucosa Clear, moist  Neck: no JVD,  Cardiovascular: S1 and S2 Present, no Murmur,  Respiratory: good respiratory effort, Bilateral Air entry equal and Decreased, no Crackles, no wheezes Abdomen: Bowel Sound present, Soft and generalized tenderness, more tender in the epigastric area Skin: No rashes Extremities: No pedal edema, no calf tenderness Neurologic: Residual right-sided weakness right arm contracture due to prior CVA, mild dysarthria and expressive aphasia/word finding difficulty Gait not checked due to patient safety concerns  Vitals:   07/14/22 1721 07/14/22 2115 07/14/22 2350 07/15/22 0509  BP: (!) 146/86 (!) 144/95 (!) 140/81 (!)  138/98  Pulse: 98 90 95 95  Resp: 18 18 18 19   Temp: 98.4 F (36.9 C) 98.3 F (36.8 C) 98.2 F (36.8 C) 99.4 F (37.4 C)  TempSrc: Oral  Oral   SpO2: 100% 99% 100% 99%  Weight:      Height:        Intake/Output Summary (Last 24 hours) at 07/15/2022 1043 Last data filed at 07/15/2022 0900 Gross per 24 hour  Intake 1176.84 ml  Output 1800 ml  Net -623.16 ml   Filed Weights   07/14/22 0901  Weight: 102.1 kg    Data Reviewed: I have personally reviewed and interpreted daily labs, tele strips, imagings as discussed above. I reviewed all nursing notes, pharmacy notes, vitals, pertinent old records I have discussed plan of care as described above with RN and patient/family.  CBC: Recent Labs  Lab 07/14/22 0904 07/14/22 2032 07/15/22 0446  WBC 14.6* 10.3 8.4  NEUTROABS  --   --  6.1  HGB 9.7* 9.1* 8.3*  HCT 32.4* 29.7* 27.9*  MCV 73.0* 73.0* 73.4*  PLT 321 345 341   Basic Metabolic Panel: Recent Labs  Lab 07/14/22 0904 07/15/22 0446  NA 140 141  K 4.0 3.7  CL 107 110  CO2 28 27  GLUCOSE 163* 97  BUN 23 20  CREATININE 1.73* 1.53*  CALCIUM 8.9 8.9  MG  --  1.8  PHOS  --  3.1  Studies: MR Abdomen W or Wo Contrast  Result Date: 07/14/2022 CLINICAL DATA:  A 76 year old male presents for evaluation of renal lesions that were discovered on a recent CT evaluation. EXAM: MRI ABDOMEN WITHOUT AND WITH CONTRAST TECHNIQUE: Multiplanar multisequence MR imaging of the abdomen was performed both before and after the administration of intravenous contrast. CONTRAST:  60m GADAVIST GADOBUTROL 1 MMOL/ML IV SOLN COMPARISON:  Is CT angiography evaluation of the same date. FINDINGS: Lower chest: No effusion or consolidative process. Limited assessment by MRI of the lung bases. Hepatobiliary: Post cholecystectomy.  No biliary duct dilation. Portal vein is patent. Numerous tiny hepatic cysts or biliary hamartoma de, benign lesions which require no additional follow-up are scattered  throughout the hepatic parenchyma. In the area of concern in the posterior RIGHT hemiliver there is no visible lesion, presumably a small transient hepatic attenuation difference on the prior CT. Pancreas: Normal intrinsic T1 signal. No ductal dilation or sign of inflammation. No focal lesion. Spleen: Normal contour of the spleen. Subtle hypointense lesion in the splenic parenchyma on T2, not visible on baseline precontrast T1 weighted imaging and faintly visible on early postcontrast imaging becoming less conspicuous on later phase imaging. Not seen in 2019 and hypointense relative to adjacent spleen on T2 weighted images at 8 mm, smaller than the measured lesion on CT. Adrenals/Urinary Tract: In the bilateral kidneys there are simple appearing renal cysts compatible with Bosniak category 1 cysts bilaterally. Largest of these cysts is present in the RIGHT kidney measuring 4.5 cm greatest axial dimension. There is no mural nodularity or internal enhancement. These are compatible with benign lesions for which no additional follow-up imaging dedicated is recommended Stomach/Bowel: No acute gastrointestinal process to the extent evaluated on this abdominal MRI with small to moderate hiatal hernia. Vascular/Lymphatic: No signs of vascular dilation. No adenopathy in the abdomen. Other:  No ascites Musculoskeletal: Compression fracture at L1 and vertebral hemangioma at L2 similar to previous imaging. IMPRESSION: 1. Benign lesions in both liver and kidneys which require no additional follow-up as outlined above. 2. Subtle hypointense lesion in the splenic parenchyma on T2, not visible in 2019 and hypointense relative to adjacent spleen on T2 weighted images. This may represent a benign lesion such as a splenic hamartoma or hemangioma. The possibility of sclerosing angiomatous nodular transformation of the spleen is considered. Consider six-month follow-up to ensure stability. 3. Small to moderate hiatal hernia.  Electronically Signed   By: GZetta BillsM.D.   On: 07/14/2022 14:29   CT ANGIO ABDOMEN PELVIS  W &/OR WO CONTRAST  Result Date: 07/14/2022 CLINICAL DATA:  76year old coffee-ground emesis. Right abdominal pain. EXAM: CTA ABDOMEN AND PELVIS WITHOUT AND WITH CONTRAST TECHNIQUE: Multidetector CT imaging of the abdomen and pelvis was performed using the standard protocol during bolus administration of intravenous contrast. Multiplanar reconstructed images and MIPs were obtained and reviewed to evaluate the vascular anatomy. RADIATION DOSE REDUCTION: This exam was performed according to the departmental dose-optimization program which includes automated exposure control, adjustment of the mA and/or kV according to patient size and/or use of iterative reconstruction technique. CONTRAST:  1015mOMNIPAQUE IOHEXOL 350 MG/ML SOLN COMPARISON:  CT abdomen and pelvis 03/03/2020 FINDINGS: VASCULAR Aorta: Atherosclerotic calcifications in the infrarenal abdominal aorta. Negative for an abdominal aortic aneurysm. No evidence for dissection or stenosis in the abdominal aorta. Celiac: Patent without evidence of aneurysm, dissection, vasculitis or significant stenosis. SMA: Patent without evidence of aneurysm, dissection, vasculitis or significant stenosis. Renals: Both renal arteries are patent without evidence  of aneurysm, dissection, vasculitis, fibromuscular dysplasia or significant stenosis. IMA: Patent Inflow: Atherosclerotic calcifications involving bilateral iliac arteries. Right common and external iliac arteries are patent without significant stenosis. Stenosis at the origin of the right internal iliac artery. At least mild stenosis in the distal left common iliac artery. Left internal iliac artery is patent. At least mild narrowing in the proximal left external iliac artery. Proximal Outflow: Proximal femoral arteries are patent bilaterally. Veins: IVC and iliac veins are patent. Portal venous system is patent. Review  of the MIP images confirms the above findings. NON-VASCULAR Lower chest: Bronchiectasis in the right lower lobe. No pleural effusions. Hepatobiliary: Subtle small hypodensities in liver probably represent small cysts but too small to definitively characterize. Peripheral hypervascular area in the right hepatic lobe on the arterial phase imaging on sequence 5 image 48. This is probably related to transient hepatic attenuation difference. Subtle enhancing exophytic area or lesion in segment 4 B on sequence 5 image 68 that measures roughly 0.8 cm. Gallbladder has been removed. Pancreas: Unremarkable. No pancreatic ductal dilatation or surrounding inflammatory changes. Spleen: Normal in size. 1.5 cm hypodensity along the superior aspect of the spleen which is not clearly identified on the portal venous phase. This is probably an incidental finding but indeterminate. Adrenals/Urinary Tract: Chronic bilateral adrenal gland thickening which may represent hyperplasia. Evidence for numerous bilateral renal cysts. Small hyperdensities along the right kidney lower pole could represent a complex or hyperdense cyst but difficult to characterize due to small size. 7 mm exophytic hypodensity in the right kidney lower pole on sequence 9 image 58 has Hounsfield units of around 41 on noncontrast images and Hounsfield units are 62 on the portal venous phase imaging. Difficult to exclude minimal enhancement in this area. There also hyperdense structures involving the left kidney. Negative for kidney stones or hydronephrosis. No ureter calcifications. Minimal enhancement or wall thickening along the posterolateral aspect of the bladder near the right ureterovesical junction on sequence 6, image 74. Stomach/Bowel: Rectum is distended with stool. Large amount of stool throughout the colon. Prominent colonic diverticula involving the descending colon and sigmoid colon. Normal appendix without inflammatory changes. Normal appearance of the  stomach. No evidence for bowel obstruction. No focal bowel inflammation. No evidence for active GI bleeding. Lymphatic: No lymph node enlargement in the abdomen or pelvis. Reproductive: Prostate is slightly enlarged. Other: Probable bilateral small inguinal hernias containing fat. Small periumbilical hernia containing fat. Negative for free fluid. Negative for free air. Musculoskeletal: Bone cement within the L1 vertebral body and evidence of previous vertebral body compression fracture. Again noted is heterogeneity throughout the L2 vertebral body with prominent inferior Schmorl's node. Progressive degenerative endplate changes at S3-M1. No acute bone abnormality. IMPRESSION: VASCULAR 1. No evidence for active GI bleeding. 2. Atherosclerotic disease involving the aorta and iliac arteries. At least mild stenosis in left common iliac artery and left external iliac artery as described. NON-VASCULAR 1. Large amount of stool throughout the colon, particularly in the rectum. No evidence for acute bowel inflammation. 2. Indeterminate findings involving the liver, kidneys and spleen. Hypervascular areas in the liver could represent transient attenuation differences but indeterminate. There are multiple bilateral renal cysts but some of these presumed cysts are hyperdense and concern for mild enhancement involving a right kidney lower pole hyperdense structure as described. There is also an indeterminate splenic lesion on the arterial phase imaging. Recommend further characterization of these indeterminate abdominal findings with an abdominal MRI, with and without contrast. 3. Subtle enhancement and  question mild wall thickening along the right posterolateral bladder near the right ureterovesical junction. This finding is nonspecific and consider non emergent urology consultation. Electronically Signed   By: Markus Daft M.D.   On: 07/14/2022 13:11    Scheduled Meds:  sodium chloride   Intravenous Once   allopurinol  300  mg Oral Daily   amLODipine  10 mg Oral Daily   cinacalcet  30 mg Oral Q breakfast   cloNIDine  0.1 mg Oral BID   insulin aspart  0-15 Units Subcutaneous Q4H   insulin glargine-yfgn  10 Units Subcutaneous QHS   lisinopril  20 mg Oral Daily   metoprolol tartrate  50 mg Oral Daily   [START ON 07/18/2022] pantoprazole  40 mg Intravenous Q12H   polyethylene glycol  17 g Oral BID   senna-docusate  2 tablet Oral BID   simvastatin  20 mg Oral q1800   tamsulosin  0.4 mg Oral Daily   Continuous Infusions:  sodium chloride 75 mL/hr at 07/14/22 1734   pantoprazole 8 mg/hr (07/15/22 0506)   PRN Meds: acetaminophen **OR** acetaminophen, iohexol, ondansetron **OR** ondansetron (ZOFRAN) IV, oxyCODONE-acetaminophen  Time spent: 35 minutes  Author: Val Riles. MD [facility] 07/15/2022 10:43 AM  To reach On-call, see care teams to locate the attending and reach out to them via [email or web address]. If 7PM-7AM, please contact night-coverage If you still have difficulty reaching the attending provider, please page the [facility] (Director on Call) for [facility] on amion for assistance.

## 2022-07-15 NOTE — Progress Notes (Signed)
Lab resulted for Hematocrit 31.8 and Hemoglobin 9.5, notified Dr. Clearence Ped. No new orders at this time.

## 2022-07-15 NOTE — Progress Notes (Signed)
Gave report to RN patient had 2 clips placed and card sent up with patient and transporters.

## 2022-07-15 NOTE — Evaluation (Signed)
Physical Therapy Evaluation Patient Details Name: [name] MRN: [id number] DOB: [birth year] Today's Date: 07/15/2022  History of Present Illness  [name]  is a 76 y.o. male, with a past medical history significant for Barret's esophagus, BPH, parotid gland cancer (adenocarcinoma), chronic abdominal pain, history of CVA with residual right hemiparesis and dysarthria, delayed gastric emptying, type 2 diabetes, GERD, hyperlipidemia and diverticulosis.  -Patient presents to ED secondary to complaints of coffee-ground emesis episode x1, patient reports she is on aspirin, denies any NSAIDs use, presents with 1 episode of coffee-ground emesis, some nausea, currently denies any further nausea, no diarrhea, has chronic constipation and abdominal pain, no fever or chills, no epistaxis, no chest pain or shortness of breath, she is recently moved to [facility] given limited mobility is likely from right-sided hemiparesis from previous CVA.   Clinical Impression  Patient functioning at baseline for functional mobility which is non-ambulatory and 2 person assist for transfers.  Patient required Max assist to sit up at bedside and unable to stand due to baseline right sided weakness.  Patient put back to bed with Max assist to reposition.  Plan:  Patient discharged from physical therapy to care of nursing for out of bed with use of mechanical lift if necessary or 2 person assist as tolerated for length of stay.        Recommendations for follow up therapy are one component of a multi-disciplinary discharge planning process, led by the attending physician.  Recommendations may be updated based on patient status, additional functional criteria and insurance authorization.  Follow Up Recommendations Long-term institutional care without follow-up therapy Can patient physically be transported by private vehicle: No    Assistance Recommended at Discharge Set up Supervision/Assistance  Patient can  return home with the following  A lot of help with bathing/dressing/bathroom;A lot of help with walking and/or transfers;Assistance with cooking/housework;Help with stairs or ramp for entrance    Equipment Recommendations None recommended by PT  Recommendations for Other Services       Functional Status Assessment Patient has had a recent decline in their functional status and/or demonstrates limited ability to make significant improvements in function in a reasonable and predictable amount of time     Precautions / Restrictions Precautions Precautions: Fall Restrictions Weight Bearing Restrictions: No      Mobility  Bed Mobility Overal bed mobility: Needs Assistance Bed Mobility: Supine to Sit, Sit to Supine     Supine to sit: Max assist Sit to supine: Mod assist   General bed mobility comments: slow labored movement, unable to pull self to sitting with LUE due to weakness    Transfers                        Ambulation/Gait                  Stairs            Wheelchair Mobility    Modified Rankin (Stroke Patients Only)       Balance Overall balance assessment: Needs assistance Sitting-balance support: Feet supported, No upper extremity supported Sitting balance-Leahy Scale: Fair Sitting balance - Comments: fair/good seated at EOB                                     Pertinent Vitals/Pain Pain Assessment Pain Assessment: Faces Faces Pain Scale: Hurts little more Pain  Location: with movement of RUE due to contractures Pain Descriptors / Indicators: Sore, Guarding, Grimacing Pain Intervention(s): Limited activity within patient's tolerance, Monitored during session, Repositioned    Home Living Family/patient expects to be discharged to:: Skilled nursing facility                        Prior Function Prior Level of Function : Needs assist       Physical Assist : Mobility (physical);ADLs (physical) Mobility  (physical): Bed mobility;Transfers;Gait;Stairs   Mobility Comments: Non-ambulatory, 2 person assist for tranfers, uses wheelchair for mobiliy, unable to propel self in wheelchair, "per patient" ADLs Comments: assisted by SNF staff     Hand Dominance   Dominant Hand: Left    Extremity/Trunk Assessment   Upper Extremity Assessment Upper Extremity Assessment: Generalized weakness;RUE deficits/detail RUE Deficits / Details: non functional due to contractures    Lower Extremity Assessment Lower Extremity Assessment: Generalized weakness    Cervical / Trunk Assessment Cervical / Trunk Assessment: Normal  Communication   Communication: Expressive difficulties  Cognition Arousal/Alertness: Awake/alert Behavior During Therapy: WFL for tasks assessed/performed Overall Cognitive Status: Within Functional Limits for tasks assessed                                 General Comments: expressive difficulties due to old CVA        General Comments      Exercises     Assessment/Plan    PT Assessment Patient does not need any further PT services  PT Problem List         PT Treatment Interventions      PT Goals (Current goals can be found in the Care Plan section)  Acute Rehab PT Goals Patient Stated Goal: return to long term care at SNF PT Goal Formulation: With patient Time For Goal Achievement: 07/15/22 Potential to Achieve Goals: Good    Frequency       Co-evaluation               AM-PAC PT "6 Clicks" Mobility  Outcome Measure Help needed turning from your back to your side while in a flat bed without using bedrails?: A Lot Help needed moving from lying on your back to sitting on the side of a flat bed without using bedrails?: A Lot Help needed moving to and from a bed to a chair (including a wheelchair)?: Total Help needed standing up from a chair using your arms (e.g., wheelchair or bedside chair)?: Total Help needed to walk in hospital room?:  Total Help needed climbing 3-5 steps with a railing? : Total 6 Click Score: 8    End of Session   Activity Tolerance: Patient tolerated treatment well;Patient limited by fatigue Patient left: in bed;with call bell/phone within reach Nurse Communication: Mobility status PT Visit Diagnosis: Unsteadiness on feet (R26.81);Other abnormalities of gait and mobility (R26.89);Muscle weakness (generalized) (M62.81)    Time: 9747-1855 PT Time Calculation (min) (ACUTE ONLY): 20 min   Charges:   PT Evaluation $PT Eval Moderate Complexity: 1 Mod PT Treatments $Therapeutic Activity: 8-22 mins        2:45 PM, 07/15/22 Lonell Grandchild, MPT Physical Therapist with [facility] [phone] office [phone] mobile phone

## 2022-07-15 NOTE — Anesthesia Postprocedure Evaluation (Signed)
Anesthesia Post Note  Patient: [name]  Procedure(s) Performed: ESOPHAGOGASTRODUODENOSCOPY (EGD) WITH PROPOFOL BIOPSY  Patient location during evaluation: Phase II Anesthesia Type: General Level of consciousness: awake Pain management: pain level controlled Vital Signs Assessment: post-procedure vital signs reviewed and stable Respiratory status: spontaneous breathing and respiratory function stable Cardiovascular status: blood pressure returned to baseline and stable Postop Assessment: no headache and no apparent nausea or vomiting Anesthetic complications: no Comments: Late entry   No notable events documented.   Last Vitals:  Vitals:   07/15/22 1745 07/15/22 1800  BP: 127/89 133/86  Pulse: 95 72  Resp: 19 17  Temp:    SpO2: 100% 100%    Last Pain:  Vitals:   07/15/22 1800  TempSrc:   PainSc: 0-No pain                 [name]

## 2022-07-15 NOTE — Op Note (Signed)
[facility] Patient Name: [name] Procedure Date: 07/15/2022 4:31 PM MRN: [id number] Date of Birth: 12-Dec-1946 Attending MD: Norvel Richards , MD CSN: [id number] Age: 76 Admit Type: Inpatient Procedure:                Upper GI endoscopy Indications:              Coffee-ground emesis Providers:                Norvel Richards, MD, Gwynneth Albright RN,                            RN, Randa Spike, Technician Referring MD:              Medicines:                Propofol per Anesthesia Complications:            No immediate complications. Estimated Blood Loss:     Estimated blood loss was minimal. Procedure:                Pre-Anesthesia Assessment:                           - Prior to the procedure, a History and Physical                            was performed, and patient medications and                            allergies were reviewed. The patient's tolerance of                            previous anesthesia was also reviewed. The risks                            and benefits of the procedure and the sedation                            options and risks were discussed with the patient.                            All questions were answered, and informed consent                            was obtained. Prior Anticoagulants: The patient has                            taken no previous anticoagulant or antiplatelet                            agents. ASA Grade Assessment: III - A patient with                            severe systemic disease. After reviewing the risks  and benefits, the patient was deemed in                            satisfactory condition to undergo the procedure.                           After obtaining informed consent, the endoscope was                            passed under direct vision. Throughout the                            procedure, the patient's blood pressure, pulse, and                             oxygen saturations were monitored continuously. The                            GIF-H190 ([id number]) scope was introduced through the                            mouth, and advanced to the second part of duodenum. Scope In: 4:58:33 PM Scope Out: 5:25:54 PM Total Procedure Duration: 0 hours 27 minutes 21 seconds  Findings:      The examined esophagus was normal.      A medium-sized hiatal hernia was present. Pedunculated 1 cm hemorrhagic       polyp on the gastric mucosa straddling the diaphragmatic hiatus. Patient       had an elongated stomach. Intensely erythematous, injected gastric       mucosal folds. Some scattered submucosal petechiae. Multiple 1 to 3 mm       hyperplastic appearing polyps scattered throughout the gastric mucosa.       Again, pylorus patent but stomach elongated - I was unable to intubate       the pylorus with the adult gastroscope as I ran out of length. I remove       the scope and obtained a pediatric colonoscope; was able to traverse the       pylorus without difficulty; examine the first and second third portion       of duodenum undertaken;      In the second portion of the duodenum there was a 3 x 2 cm adenomatous       appearing lesion. Please see photos. It was biopsied.      Scope was pulled back into the stomach where mucosal biopsies were taken       for H. pylori testing. The hemorrhagic polyp straddling the       diaphragmatic hiatus was removed cleanly 1 pass of hot snare cautery. 2       hemostasis clips were deployed. Impression:               - Normal esophagus. Multiple gastric polyps. 1                            large hemorrhagic polyp straddling the  diaphragmatic hiatus removed with hot snare                            cautery; polypectomy site clipped                           -Markedly inflamed appearing gastric mucosa -status                            post biopsy.                           Duodenal polypoid  mass?"status post biopsy Moderate Sedation:      Moderate (conscious) sedation was personally administered by an       anesthesia professional. The following parameters were monitored: oxygen       saturation, heart rate, blood pressure, respiratory rate, EKG, adequacy       of pulmonary ventilation, and response to care. Recommendation:           - Patient has a contact number available for                            emergencies. The signs and symptoms of potential                            delayed complications were discussed with the                            patient. Return to normal activities tomorrow.                            Written discharge instructions were provided to the                            patient.                           - Return patient to hospital ward for ongoing care.                           - Clear liquid diet.                           - Continue present medications. Twice daily PPI.                            Follow-up on pathology. No MRI until clips gone. I                            called Herold Salguero at [phone] -I was unable                            to reach. Procedure Code(s):        --- Professional ---  50037, Esophagogastroduodenoscopy, flexible,                            transoral; diagnostic, including collection of                            specimen(s) by brushing or washing, when performed                            (separate procedure) Diagnosis Code(s):        --- Professional ---                           K44.9, Diaphragmatic hernia without obstruction or                            gangrene                           K92.0, Hematemesis CPT copyright 2019 American Medical Association. All rights reserved. The codes documented in this report are preliminary and upon coder review may  be revised to meet current compliance requirements. Cristopher Estimable. Shaylynne Lunt, MD Norvel Richards, MD 07/15/2022 5:48:53  PM This report has been signed electronically. Number of Addenda: 0

## 2022-07-15 NOTE — Anesthesia Preprocedure Evaluation (Signed)
Anesthesia Evaluation  Patient identified by MRN, date of birth, ID band Patient awake    Reviewed: Allergy & Precautions, H&P , NPO status , Patient's Chart, lab work & pertinent test results, reviewed documented beta blocker date and time   Airway Mallampati: II  TM Distance: >3 FB Neck ROM: full    Dental no notable dental hx.    Pulmonary asthma , COPD, former smoker,    Pulmonary exam normal breath sounds clear to auscultation       Cardiovascular Exercise Tolerance: Good hypertension, negative cardio ROS   Rhythm:regular Rate:Normal     Neuro/Psych  Neuromuscular disease CVA, Residual Symptoms negative psych ROS   GI/Hepatic Neg liver ROS, hiatal hernia, GERD  Medicated,  Endo/Other  diabetesHypothyroidism   Renal/GU CRF and ARFRenal disease  negative genitourinary   Musculoskeletal   Abdominal   Peds  Hematology  (+) Blood dyscrasia, anemia ,   Anesthesia Other Findings   Reproductive/Obstetrics negative OB ROS                             Anesthesia Physical Anesthesia Plan  ASA: 4 and emergent  Anesthesia Plan: General   Post-op Pain Management:    Induction:   PONV Risk Score and Plan: Propofol infusion  Airway Management Planned:   Additional Equipment:   Intra-op Plan:   Post-operative Plan:   Informed Consent: I have reviewed the patients History and Physical, chart, labs and discussed the procedure including the risks, benefits and alternatives for the proposed anesthesia with the patient or authorized representative who has indicated his/her understanding and acceptance.     Dental Advisory Given  Plan Discussed with: CRNA  Anesthesia Plan Comments:         Anesthesia Quick Evaluation

## 2022-07-15 NOTE — Interval H&P Note (Signed)
History and Physical Interval Note:  07/15/2022 4:46 PM  [name]  has presented today for surgery, with the diagnosis of coffee ground emesis, ruq pain.  The various methods of treatment have been discussed with the patient and family. After consideration of risks, benefits and other options for treatment, the patient has consented to  Procedure(s): ESOPHAGOGASTRODUODENOSCOPY (EGD) WITH PROPOFOL (N/A) as a surgical intervention.  The patient's history has been reviewed, patient examined, no change in status, stable for surgery.  I have reviewed the patient's chart and labs.  Questions were answered to the patient's satisfaction.     [name]  Patient seen in short stay.  He is remained stable overnight.  Agree with need for diagnostic EGD.  This has been discussed with the patient patient's caregiver.  No dysphagia.  Diagnostic EGD today per plan.  Risk benefits limitations have been reviewed.  All parties agreeable.  Further recommendations to follow.

## 2022-07-15 NOTE — Progress Notes (Signed)
Back from Endo.  Wife at bedside. Tech clean patient and malewick applied.  Drinking and having iccee

## 2022-07-15 NOTE — NC FL2 (Signed)
[place] MEDICAID FL2 LEVEL OF CARE SCREENING TOOL     IDENTIFICATION  Patient Name: [name] Birthdate: [birth year] Sex: male Admission Date (Current Location): 07/14/2022  [facility] [place] Number:  [employer] and Address:  [facility] [address], [place]      Provider Number: [id number]  Attending Physician Name and Address:  Val Riles, MD  Relative Name and Phone Number:  Naji, Mehringer (Spouse)   [phone]    Current Level of Care: Hospital Recommended Level of Care: [place] Prior Approval Number:    Date Approved/Denied:   PASRR Number:    Discharge Plan: SNF    Current Diagnoses: Patient Active Problem List   Diagnosis Date Noted   Coffee ground emesis 07/14/2022   MGUS (monoclonal gammopathy of unknown significance) 03/03/2021   Chronic obstructive pulmonary disease ([place]) 02/17/2021   Common variable immunodeficiency ([place]) 02/17/2021   Osteoarthrosis 02/17/2021   Diverticular disease 01/05/2021   History of cerebrovascular accident 01/05/2021   Hiatal hernia 01/05/2021   Hyperlipidemia 01/05/2021   Glaucoma 01/05/2021   Esotropia of left eye 01/05/2021   Benign prostatic hyperplasia 01/05/2021   History of radiation therapy 01/05/2021   Anemia 01/05/2021   GI bleed 03/18/2020   Acute lower GI bleeding 03/03/2020   Pressure injury of skin 03/03/2020   Thoracic aortic aneurysm without rupture ([place]) 11/19/2019   Hardening of the aorta (main artery of the heart) ([place]) 11/19/2019   Protein-calorie malnutrition ([place]) 06/10/2019   Avitaminosis D 03/07/2019   Male hypogonadism 07/26/2018   Gynecomastia 06/27/2018   Malignant neoplasm of upper lobe of left lung ([place]) 04/06/2018   History of malignant neoplasm of parotid gland 02/07/2018   Adenoma of large intestine 11/09/2016   Adult hypothyroidism 09/09/2015   Chronic constipation 05/22/2015   Impetigo bullosa 10/23/2013   Chronic  left shoulder pain 09/25/2013   Muscle weakness (generalized) 09/25/2013   Hypercalcemia 08/08/2013   Chronic kidney disease, stage 3 unspecified ([place]) 08/07/2013   AKI (acute kidney injury) ([place]) 07/24/2013   Acute kidney failure, unspecified ([place]) 07/24/2013   Adhesive capsulitis of left shoulder 05/14/2013   GERD (gastroesophageal reflux disease)    Carcinoma of parotid gland ([place]) 11/23/2012   Epidermoid cyst 10/05/2012   Neck pain 08/26/2012   Chronic abdominal pain 08/26/2012   Edema of lower extremity 08/24/2012   Hemiplegia of dominant side as late effect of cerebrovascular disease ([place]) 06/06/2012   Diarrhea 04/17/2012   Alimentary obesity 04/17/2012   Type 2 diabetes mellitus with diabetic neuropathy, unspecified ([place]) 02/13/2012   Recurrent boils 01/12/2012   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 ([place]) 07/15/2011   Hemorrhoids, internal 03/23/2011   Hepatitis 03/02/2011   Steatohepatitis 03/02/2011   Abdominal pain, right upper quadrant 12/16/2010   Abnormal levels of other serum enzymes 12/16/2010   Eosinophil count raised 12/23/2009   HEMATOCHEZIA 12/23/2009   Type 2 diabetes mellitus ([place]) 01/05/2009   Gout 01/05/2009   Hypertensive disorder 01/05/2009   BARRETTS ESOPHAGUS 01/05/2009   DIVERTICULOSIS OF COLON 01/05/2009   History of malignant lymphoma 01/05/2009    Orientation RESPIRATION BLADDER Height & Weight     Self, Time, Situation, Place  Normal Incontinent Weight: 225 lb (102.1 kg) Height:  6' (182.9 cm)  BEHAVIORAL SYMPTOMS/MOOD NEUROLOGICAL BOWEL NUTRITION STATUS      Incontinent Diet  AMBULATORY STATUS COMMUNICATION OF NEEDS Skin   Total Care Verbally Normal  Personal Care Assistance Level of Assistance  Total care Bathing Assistance: Maximum assistance Feeding assistance: Limited assistance Dressing Assistance: Maximum assistance Total Care Assistance: Maximum assistance   Functional Limitations Info   Sight, Hearing, Speech Sight Info: Adequate Hearing Info: Adequate Speech Info: Adequate    SPECIAL CARE FACTORS FREQUENCY                       Contractures Contractures Info: Present    Additional Factors Info  Code Status, Allergies, Psychotropic Code Status Info: DNR Allergies Info: NKA Psychotropic Info: trazodone, wellbutrin         Current Medications (07/15/2022):  This is the current hospital active medication list Current Facility-Administered Medications  Medication Dose Route Frequency Provider Last Rate Last Admin   0.9 %  sodium chloride infusion (Manually program via Guardrails IV Fluids)   Intravenous Once Elgergawy, Silver Huguenin, MD       0.9 %  sodium chloride infusion   Intravenous Continuous Elgergawy, Silver Huguenin, MD 75 mL/hr at 07/14/22 1734 New Bag at 07/14/22 1734   acetaminophen (TYLENOL) tablet 650 mg  650 mg Oral Q6H PRN Elgergawy, Silver Huguenin, MD       Or   acetaminophen (TYLENOL) suppository 650 mg  650 mg Rectal Q6H PRN Elgergawy, Silver Huguenin, MD       allopurinol (ZYLOPRIM) tablet 300 mg  300 mg Oral Daily Elgergawy, Silver Huguenin, MD   300 mg at 07/15/22 0856   amLODipine (NORVASC) tablet 10 mg  10 mg Oral Daily Elgergawy, Silver Huguenin, MD   10 mg at 07/15/22 0854   cinacalcet (SENSIPAR) tablet 30 mg  30 mg Oral Q breakfast Elgergawy, Silver Huguenin, MD   30 mg at 07/15/22 3546   cloNIDine (CATAPRES) tablet 0.1 mg  0.1 mg Oral BID Elgergawy, Silver Huguenin, MD   0.1 mg at 07/15/22 0859   insulin aspart (novoLOG) injection 0-15 Units  0-15 Units Subcutaneous Q4H Val Riles, MD       insulin glargine-yfgn [facility]) injection 10 Units  10 Units Subcutaneous QHS Elgergawy, Silver Huguenin, MD   10 Units at 07/14/22 2107   iohexol (OMNIPAQUE) 300 MG/ML solution 100 mL  100 mL Intravenous Once PRN Elgergawy, Silver Huguenin, MD       lisinopril (ZESTRIL) tablet 20 mg  20 mg Oral Daily Elgergawy, Silver Huguenin, MD   20 mg at 07/15/22 0856   metoprolol tartrate (LOPRESSOR) tablet 50 mg  50 mg Oral  Daily Elgergawy, Silver Huguenin, MD   50 mg at 07/15/22 0855   ondansetron (ZOFRAN) tablet 4 mg  4 mg Oral Q6H PRN Elgergawy, Silver Huguenin, MD       Or   ondansetron (ZOFRAN) injection 4 mg  4 mg Intravenous Q6H PRN Elgergawy, Silver Huguenin, MD       oxyCODONE-acetaminophen (PERCOCET/ROXICET) 5-325 MG per tablet 1.5 tablet  1.5 tablet Oral Q6H PRN Elgergawy, Silver Huguenin, MD       [START ON 07/18/2022] pantoprazole (PROTONIX) injection 40 mg  40 mg Intravenous Q12H Elgergawy, Silver Huguenin, MD       pantoprozole (PROTONIX) 80 mg /NS 100 mL infusion  8 mg/hr Intravenous Continuous Elgergawy, Silver Huguenin, MD 10 mL/hr at 07/15/22 0506 8 mg/hr at 07/15/22 0506   polyethylene glycol (MIRALAX / GLYCOLAX) packet 17 g  17 g Oral BID Elgergawy, Silver Huguenin, MD   17 g at 07/15/22 0857   senna-docusate (Senokot-S) tablet 2 tablet  2 tablet Oral BID Elgergawy, Silver Huguenin,  MD   2 tablet at 07/15/22 1000   simvastatin (ZOCOR) tablet 20 mg  20 mg Oral q1800 Elgergawy, Silver Huguenin, MD   20 mg at 07/14/22 1735   tamsulosin (FLOMAX) capsule 0.4 mg  0.4 mg Oral Daily Elgergawy, Silver Huguenin, MD   0.4 mg at 07/15/22 4327     Discharge Medications: Please see discharge summary for a list of discharge medications.  Relevant Imaging Results:  Relevant Lab Results:   Additional Information    Lynzi Meulemans, Clydene Pugh, LCSW

## 2022-07-16 DIAGNOSIS — K920 Hematemesis: Secondary | ICD-10-CM | POA: Diagnosis not present

## 2022-07-16 LAB — BASIC METABOLIC PANEL
Anion gap: 5 (ref 5–15)
BUN: 18 mg/dL (ref 8–23)
CO2: 26 mmol/L (ref 22–32)
Calcium: 8.6 mg/dL — ABNORMAL LOW (ref 8.9–10.3)
Chloride: 110 mmol/L (ref 98–111)
Creatinine, Ser: 1.42 mg/dL — ABNORMAL HIGH (ref 0.61–1.24)
GFR, Estimated: 52 mL/min — ABNORMAL LOW (ref >60)
Glucose, Bld: 80 mg/dL (ref 70–99)
Potassium: 4 mmol/L (ref 3.5–5.1)
Sodium: 141 mmol/L (ref 135–145)

## 2022-07-16 LAB — GLUCOSE, CAPILLARY
Glucose-Capillary: 101 mg/dL — ABNORMAL HIGH (ref 70–99)
Glucose-Capillary: 126 mg/dL — ABNORMAL HIGH (ref 70–99)
Glucose-Capillary: 84 mg/dL (ref 70–99)
Glucose-Capillary: 85 mg/dL (ref 70–99)
Glucose-Capillary: 85 mg/dL (ref 70–99)
Glucose-Capillary: 88 mg/dL (ref 70–99)

## 2022-07-16 LAB — CBC
HCT: 29.5 % — ABNORMAL LOW (ref 39.0–52.0)
Hemoglobin: 8.8 g/dL — ABNORMAL LOW (ref 13.0–17.0)
MCH: 21.8 pg — ABNORMAL LOW (ref 26.0–34.0)
MCHC: 29.8 g/dL — ABNORMAL LOW (ref 30.0–36.0)
MCV: 73.2 fL — ABNORMAL LOW (ref 80.0–100.0)
Platelets: 265 10*3/uL (ref 150–400)
RBC: 4.03 MIL/uL — ABNORMAL LOW (ref 4.22–5.81)
RDW: 19.4 % — ABNORMAL HIGH (ref 11.5–15.5)
WBC: 8 10*3/uL (ref 4.0–10.5)
nRBC: 0 % (ref 0.0–0.2)

## 2022-07-16 LAB — PHOSPHORUS: Phosphorus: 2.9 mg/dL (ref 2.5–4.6)

## 2022-07-16 LAB — MAGNESIUM: Magnesium: 1.7 mg/dL (ref 1.7–2.4)

## 2022-07-16 MED ORDER — CYANOCOBALAMIN 1000 MCG/ML IJ SOLN
1000.0000 ug | Freq: Every day | INTRAMUSCULAR | Status: DC
Start: 2022-07-16 — End: 2022-07-17
  Administered 2022-07-16 – 2022-07-17 (×2): 1000 ug via INTRAMUSCULAR
  Filled 2022-07-16 (×2): qty 1

## 2022-07-16 MED ORDER — POLYSACCHARIDE IRON COMPLEX 150 MG PO CAPS
150.0000 mg | ORAL_CAPSULE | Freq: Every day | ORAL | Status: DC
  Administered 2022-07-16 – 2022-07-17 (×2): 150 mg via ORAL
  Filled 2022-07-16 (×2): qty 1

## 2022-07-16 MED ORDER — VITAMIN B-12 1000 MCG PO TABS: 1000.0000 ug | ORAL_TABLET | Freq: Every day | ORAL | Status: DC

## 2022-07-16 MED ORDER — BACITRACIN-POLYMYXIN B OP OINT
1.0000 | TOPICAL_OINTMENT | Freq: Three times a day (TID) | OPHTHALMIC | Status: DC
  Administered 2022-07-16 – 2022-07-17 (×4): 1 via OPHTHALMIC
  Filled 2022-07-16: qty 0.4

## 2022-07-16 NOTE — Progress Notes (Signed)
Left eye deviation exotropia R sided paralysis RUE and RLE from previous stroke. Patient tolerated liquid diet. No N/V/D present at this time. Will continue to monitor patient.

## 2022-07-16 NOTE — Progress Notes (Signed)
[facility] Progress Note  Patient: [name]    WRU:[id number]  DOA: 07/14/2022     Date of Service: the patient was seen and examined on 07/16/2022  Chief Complaint  Patient presents with   Abdominal Pain   Brief hospital course: Friend Dorfman  is a 76 y.o. male, with a past medical history significant for Barret's esophagus, BPH, parotid gland cancer (adenocarcinoma), chronic abdominal pain, history of CVA with residual right hemiparesis and dysarthria, delayed gastric emptying, type 2 diabetes, GERD, hyperlipidemia and diverticulosis. -Patient presents to ED secondary to complaints of coffee-ground emesis episode x1, patient reports she is on aspirin, denies any NSAIDs use, presents with 1 episode of coffee-ground emesis, some nausea, currently denies any further nausea, no diarrhea, has chronic constipation and abdominal pain, no fever or chills, no epistaxis, no chest pain or shortness of breath, she is recently moved to [facility] given limited mobility is likely from right-sided hemiparesis from previous CVA. - in ED creatinine was at baseline 1.73, hemoglobin is 9.7, down from 11.8 last year, signs are stable, ED discussed with GI who recommended clear liquids for today, and endoscopy in a.m., [facility] consulted to admit.   Assessment and Plan: Principal Problem:   Coffee ground emesis Active Problems:   Type 2 diabetes mellitus (HCC)   Gout   BARRETTS ESOPHAGUS   GERD (gastroesophageal reflux disease)   Chronic constipation   MGUS (monoclonal gammopathy of unknown significance)     Coffee-ground emesis Upper GI bleed Acute blood loss anemia -Patient presents with coffee-ground emesis, most likely due to upper GI bleed, denies any NSAIDs use, but he is on aspirin. -Endoscopy in 2017 significant for Barrett's esophagus. -Had an episode of lower GI bleed in 2021 thought to be secondary to ischemic colitis, but no colonoscopy was done then -will monitor  CBC closely. -s/p Protonix drip, transition to pantoprazole 40 mg IV every 12 hourly. -GI consulted, s/p EGD EGD: done on 07/15/22 - Normal esophagus. Multiple gastric polyps. 1 large hemorrhagic polyp straddling the diaphragmatic hiatus removed with hot snare cautery; polypectomy site clipped -Markedly inflamed appearing gastric mucosa -status post biopsy. Duodenal polypoid mass?status post biopsy - Patient has a contact number available for emergencies. The signs and symptoms of potential delayed complications were discussed with the patient. Return to normal activities tomorrow. Written discharge instructions were provided to the patient. - Return patient to hospital ward for ongoing care. - Clear liquid diet, advance to soft diet - Continue present medications. Twice daily PPI. Follow-up on pathology. No MRI until clips gone. GI called Xavier Munger at [phone] -but he was unable to reach. -Continue to hold aspirin. -SCD for DVT prophylaxis Hb 9.1---8.3--8.8 stable  continue to monitor H&H and transfuse if hemoglobin less than 7    Mild iron deficiency, started oral iron supplement.  Repeat iron profile after 3 months and follow with PCP  Vitamin B12 deficiency, started vitamin B12 1000 mcg IM injection daily during hospital stay followed by oral supplement.  Follow with PCP to repeat a vitamin B12 level after 3 months.   CKD stage IIIb Monoclonal gammopathy of unknown significance -Renal function at baseline, continue to monitor closely   Hypertension -Mildly elevated, continue with home medications    GERD -Continue with PPI   Hyperlipidemia -Continue with home statin   BPH -Continue with Flomax   Type 2 diabetes with nephropathy -will resume on home dose insulin at a lower dose 30> 15 units, and will keep an  insulin sliding scale   History  of stroke -Right side contracted  -Resume aspirin once cleared by GI, continue with statin    Abnormal CT abdomen  pelvis with abnormal lesion finding in liver, spleen and kidneys. - MRI abdomen was obtained which was reassuring Splenic lesion, patient needs follow-up in 6 months, recommended to follow with PCP for further work-up as an outpatient  Constipation -CT abdomen and pelvis with large stool burden, started on bowel regimen Discharge on laxatives to prevent constipation as per GI Patient is not a good candidate for colonoscopy.  Body mass index is 30.52 kg/m.  Interventions:   Pressure Injury 03/03/20 Buttocks Right;Medial Stage 2 -  Partial thickness loss of dermis presenting as a shallow open injury with a red, pink wound bed without slough. (Active)  03/03/20 0930  Location: Buttocks  Location Orientation: Right;Medial  Staging: Stage 2 -  Partial thickness loss of dermis presenting as a shallow open injury with a red, pink wound bed without slough.  Wound Description (Comments):   Present on Admission: Yes     Pressure Injury 03/03/20 Hip Left Stage 2 -  Partial thickness loss of dermis presenting as a shallow open injury with a red, pink wound bed without slough. (Active)  03/03/20 0930  Location: Hip  Location Orientation: Left  Staging: Stage 2 -  Partial thickness loss of dermis presenting as a shallow open injury with a red, pink wound bed without slough.  Wound Description (Comments):   Present on Admission: Yes     Diet: Currently n.p.o. DVT Prophylaxis: SCD, pharmacological prophylaxis contraindicated due to upper GI bleeding    Advance goals of care discussion: DNR  Family Communication: family was not present at bedside, at the time of interview.  The pt provided permission to discuss medical plan with the family. Opportunity was given to ask question and all questions were answered satisfactorily.   Disposition:  Pt is from SNF, admitted with coffee-ground emesis, upper GI bleeding, GI work-up pending, H&H is being monitored and patient is on IV PPI infusion, which  precludes a safe discharge. Discharge to SNF, when medically stable, bleeding resolved and H&H remained stable, need GI clearance.  Subjective: No significant overnight events, patient was admitted due to coffee-ground emesis.  Patient is complaining of epigastric pain 4/10, no nausea vomiting during hospital stay.  Patient denies any chest pain or palpitation, no shortness of breath.   Physical Exam: General:  alert oriented to time, place, and person.  Appear in mild distress, affect appropriate Eyes: PERRLA, left eye deviated laterally which is chronic ENT: Oral Mucosa Clear, moist  Neck: no JVD,  Cardiovascular: S1 and S2 Present, no Murmur,  Respiratory: good respiratory effort, Bilateral Air entry equal and Decreased, no Crackles, no wheezes Abdomen: Bowel Sound present, Soft and mild generalized tenderness. Skin: No rashes Extremities: No pedal edema, no calf tenderness Neurologic: Residual right-sided weakness right arm contracture due to prior CVA, mild dysarthria and expressive aphasia/word finding difficulty Gait not checked due to patient safety concerns  Vitals:   07/15/22 1800 07/15/22 2044 07/16/22 0413 07/16/22 0836  BP: 133/86 (!) 146/98 (!) 163/98 (!) 160/97  Pulse: 72 79 79 91  Resp: 17 18 18    Temp:  98.6 F (37 C)    TempSrc:      SpO2: 100% 100% 100%   Weight:      Height:        Intake/Output Summary (Last 24 hours) at 07/16/2022 0355 Last data filed  at 07/15/2022 2039 Gross per 24 hour  Intake 540 ml  Output 400 ml  Net 140 ml   Filed Weights   07/14/22 0901  Weight: 102.1 kg    Data Reviewed: I have personally reviewed and interpreted daily labs, tele strips, imagings as discussed above. I reviewed all nursing notes, pharmacy notes, vitals, pertinent old records I have discussed plan of care as described above with RN and patient/family.  CBC: Recent Labs  Lab 07/14/22 0904 07/14/22 2032 07/15/22 0446 07/15/22 1953 07/16/22 0454  WBC  14.6* 10.3 8.4  --  8.0  NEUTROABS  --   --  6.1  --   --   HGB 9.7* 9.1* 8.3* 9.5* 8.8*  HCT 32.4* 29.7* 27.9* 31.8* 29.5*  MCV 73.0* 73.0* 73.4*  --  73.2*  PLT 321 345 270  --  160   Basic Metabolic Panel: Recent Labs  Lab 07/14/22 0904 07/15/22 0446 07/16/22 0454  NA 140 141 141  K 4.0 3.7 4.0  CL 107 110 110  CO2 28 27 26   GLUCOSE 163* 97 80  BUN 23 20 18   CREATININE 1.73* 1.53* 1.42*  CALCIUM 8.9 8.9 8.6*  MG  --  1.8 1.7  PHOS  --  3.1 2.9    Studies: No results found.  Scheduled Meds:  sodium chloride   Intravenous Once   allopurinol  300 mg Oral Daily   amLODipine  10 mg Oral Daily   bacitracin-polymyxin b (ophth)   Both Eyes TID   cinacalcet  30 mg Oral Q breakfast   cloNIDine  0.1 mg Oral BID   insulin aspart  0-15 Units Subcutaneous Q4H   insulin glargine-yfgn  10 Units Subcutaneous QHS   lisinopril  20 mg Oral Daily   metoprolol tartrate  50 mg Oral Daily   pantoprazole (PROTONIX) IV  40 mg Intravenous Q12H   polyethylene glycol  17 g Oral BID   senna-docusate  2 tablet Oral BID   simvastatin  20 mg Oral q1800   tamsulosin  0.4 mg Oral Daily   Continuous Infusions:  sodium chloride 75 mL/hr at 07/16/22 0209   PRN Meds: acetaminophen **OR** acetaminophen, iohexol, ondansetron **OR** ondansetron (ZOFRAN) IV, oxyCODONE-acetaminophen  Time spent: 35 minutes  Author: Val Riles. MD [facility] 07/16/2022 8:55 AM  To reach On-call, see care teams to locate the attending and reach out to them via [email or web address]. If 7PM-7AM, please contact night-coverage If you still have difficulty reaching the attending provider, please page the [facility] (Director on Call) for [facility] on amion for assistance.

## 2022-07-16 NOTE — Progress Notes (Signed)
Patient states he feels better today.  Still some right-sided abdominal pain from time to time.  He is hungry; he wants more to eat.  Vital signs in last 24 hours: Temp:  [98.6 F (37 C)-99.2 F (37.3 C)] 98.6 F (37 C) (08/04 2044) Pulse Rate:  [72-95] 91 (08/05 0836) Resp:  [13-19] 18 (08/05 0413) BP: (81-163)/(65-98) 160/97 (08/05 0836) SpO2:  [98 %-100 %] 100 % (08/05 0413) Last BM Date : 07/12/22 General:   Alert,  pleasant and cooperative in NAD; accompanied by his wife. Abdomen: Full.  Positive bowel sounds.  Soft and nontender to palpation. Extremities:  Without clubbing or edema.    Intake/Output from previous day: 08/04 0701 - 08/05 0700 In: 540 [P.O.:240; I.V.:300] Out: 400 [Urine:400] Intake/Output this shift: No intake/output data recorded.  Lab Results: Recent Labs    07/14/22 2032 07/15/22 0446 07/15/22 1953 07/16/22 0454  WBC 10.3 8.4  --  8.0  HGB 9.1* 8.3* 9.5* 8.8*  HCT 29.7* 27.9* 31.8* 29.5*  PLT 345 270  --  265   BMET Recent Labs    07/14/22 0904 07/15/22 0446 07/16/22 0454  NA 140 141 141  K 4.0 3.7 4.0  CL 107 110 110  CO2 28 27 26   GLUCOSE 163* 97 80  BUN 23 20 18   CREATININE 1.73* 1.53* 1.42*  CALCIUM 8.9 8.9 8.6*   LFT Recent Labs    07/14/22 0904  PROT 7.3  ALBUMIN 3.0*  AST 9*  ALT 8  ALKPHOS 98  BILITOT 0.4   Impression: Pleasant 76 year old gentleman with multiple multiple comorbidities including GERD and gastroparesis admitted to the hospital after an episode of coffee-ground emesis.  EGD yesterday demonstrated no significant esophageal abnormalities.  Patient had a moderate-sized hiatal hernia and hemorrhagic polyp straddling the diaphragmatic hiatus; he also had marked diffuse gastric mucosal inflammatory changes without ulcer or obvious gastric tumor seen.  Gastric polyp was removed.  Biopsies were taken for H. pylori.  In the duodenum, there was a sprawling adenomatous appearing lesion in the second portion.    Biopsies  taken.  Clinically stable today.  Hemoglobin 8.8; down from 9.5 yesterday.  No further overt bleeding.  Patient looks pretty good.   Recommendations:  Agree with advancing to a soft diet.  Once daily PPI indefinitely  Amitiza twice daily to maintain regular bowel function.  As discussed with the patient and his wife at the bedside, further invasive GI evaluation including colonoscopy not recommended as his associated comorbidities are felt to be prohibitive.  Continue to follow clinically, trend H&H.  I'll follow up on pathology.  Discussed with Dr. Dwyane Dee.

## 2022-07-16 NOTE — Progress Notes (Signed)
Patient alert and oriented some complaints of stomach pain minimal rates at 4 on pain scale. Oxycodone PO given. Patient has some green/yellow drainage coming out of L eye. MD informed orders for abx ointment. Provided eye care washed both with johnson johnsons baby soap then applied ointment to both eyes as ordered. No N/V/D at this present time. No other complaints at this time. Wife at bedside. Call light within reach.

## 2022-07-16 NOTE — TOC Progression Note (Signed)
Transition of Care [facility]) - Progression Note    Patient Details  Name: [name] MRN: [id number] Date of Birth: 05-16-1946  Transition of [facility]) CM/SW Contact  Salome Arnt, [place] Phone Number: 07/16/2022, 11:11 AM  Clinical Narrative:  Jackelyn Poling at [facility] updated on possible d/c tomorrow. TOC will continue to follow.      Expected Discharge Plan: [place] Barriers to Discharge: Continued Medical Work up  Expected Discharge Plan and Services Expected Discharge Plan: [place]: [place] arrangements for the past 2 months: Single Family Home                                       Social Determinants of Health (SDOH) Interventions    Readmission Risk Interventions    07/15/2022    2:16 PM  Readmission Risk Prevention Plan  Transportation Screening Complete  PCP or Specialist Appt within 5-7 Days Complete

## 2022-07-16 NOTE — Progress Notes (Signed)
Patient slept this shift, only waking when a new Bag of fluids needed to be hung and for vitals. No complaints of pain. Blood pressure at 2244 146/98 and at 0413 BP was 163/98 Notified Dr. Josph MachoOrlin Hilding of patient blood pressures this shift. Patient denise any pain. No new orders at this time. Will continue to monitor patient.

## 2022-07-17 DIAGNOSIS — R293 Abnormal posture: Secondary | ICD-10-CM | POA: Diagnosis not present

## 2022-07-17 DIAGNOSIS — K219 Gastro-esophageal reflux disease without esophagitis: Secondary | ICD-10-CM

## 2022-07-17 DIAGNOSIS — N1831 Chronic kidney disease, stage 3a: Secondary | ICD-10-CM | POA: Diagnosis not present

## 2022-07-17 DIAGNOSIS — D472 Monoclonal gammopathy: Secondary | ICD-10-CM | POA: Diagnosis not present

## 2022-07-17 DIAGNOSIS — Z794 Long term (current) use of insulin: Secondary | ICD-10-CM | POA: Diagnosis not present

## 2022-07-17 DIAGNOSIS — E538 Deficiency of other specified B group vitamins: Secondary | ICD-10-CM | POA: Diagnosis not present

## 2022-07-17 DIAGNOSIS — M109 Gout, unspecified: Secondary | ICD-10-CM | POA: Diagnosis not present

## 2022-07-17 DIAGNOSIS — R69 Illness, unspecified: Secondary | ICD-10-CM | POA: Diagnosis not present

## 2022-07-17 DIAGNOSIS — K5909 Other constipation: Secondary | ICD-10-CM | POA: Diagnosis not present

## 2022-07-17 DIAGNOSIS — G8190 Hemiplegia, unspecified affecting unspecified side: Secondary | ICD-10-CM | POA: Diagnosis not present

## 2022-07-17 DIAGNOSIS — R634 Abnormal weight loss: Secondary | ICD-10-CM | POA: Diagnosis not present

## 2022-07-17 DIAGNOSIS — K922 Gastrointestinal hemorrhage, unspecified: Secondary | ICD-10-CM | POA: Diagnosis not present

## 2022-07-17 DIAGNOSIS — Z79899 Other long term (current) drug therapy: Secondary | ICD-10-CM | POA: Diagnosis not present

## 2022-07-17 DIAGNOSIS — D7389 Other diseases of spleen: Secondary | ICD-10-CM | POA: Diagnosis not present

## 2022-07-17 DIAGNOSIS — R109 Unspecified abdominal pain: Secondary | ICD-10-CM | POA: Diagnosis not present

## 2022-07-17 DIAGNOSIS — K920 Hematemesis: Secondary | ICD-10-CM | POA: Diagnosis not present

## 2022-07-17 DIAGNOSIS — I890 Lymphedema, not elsewhere classified: Secondary | ICD-10-CM | POA: Diagnosis not present

## 2022-07-17 DIAGNOSIS — D0220 Carcinoma in situ of unspecified bronchus and lung: Secondary | ICD-10-CM | POA: Diagnosis not present

## 2022-07-17 DIAGNOSIS — D509 Iron deficiency anemia, unspecified: Secondary | ICD-10-CM | POA: Diagnosis not present

## 2022-07-17 DIAGNOSIS — R52 Pain, unspecified: Secondary | ICD-10-CM | POA: Diagnosis not present

## 2022-07-17 DIAGNOSIS — I10 Essential (primary) hypertension: Secondary | ICD-10-CM | POA: Diagnosis not present

## 2022-07-17 DIAGNOSIS — D649 Anemia, unspecified: Secondary | ICD-10-CM | POA: Diagnosis not present

## 2022-07-17 DIAGNOSIS — L02212 Cutaneous abscess of back [any part, except buttock]: Secondary | ICD-10-CM | POA: Diagnosis not present

## 2022-07-17 DIAGNOSIS — M6281 Muscle weakness (generalized): Secondary | ICD-10-CM | POA: Diagnosis not present

## 2022-07-17 DIAGNOSIS — Z7982 Long term (current) use of aspirin: Secondary | ICD-10-CM | POA: Diagnosis not present

## 2022-07-17 DIAGNOSIS — I2510 Atherosclerotic heart disease of native coronary artery without angina pectoris: Secondary | ICD-10-CM | POA: Diagnosis not present

## 2022-07-17 DIAGNOSIS — E785 Hyperlipidemia, unspecified: Secondary | ICD-10-CM | POA: Diagnosis not present

## 2022-07-17 DIAGNOSIS — E119 Type 2 diabetes mellitus without complications: Secondary | ICD-10-CM | POA: Diagnosis not present

## 2022-07-17 DIAGNOSIS — I69351 Hemiplegia and hemiparesis following cerebral infarction affecting right dominant side: Secondary | ICD-10-CM | POA: Diagnosis not present

## 2022-07-17 LAB — PHOSPHORUS: Phosphorus: 2.8 mg/dL (ref 2.5–4.6)

## 2022-07-17 LAB — CBC
HCT: 29.3 % — ABNORMAL LOW (ref 39.0–52.0)
Hemoglobin: 8.8 g/dL — ABNORMAL LOW (ref 13.0–17.0)
MCH: 22.2 pg — ABNORMAL LOW (ref 26.0–34.0)
MCHC: 30 g/dL (ref 30.0–36.0)
MCV: 73.8 fL — ABNORMAL LOW (ref 80.0–100.0)
Platelets: 256 10*3/uL (ref 150–400)
RBC: 3.97 MIL/uL — ABNORMAL LOW (ref 4.22–5.81)
RDW: 19.4 % — ABNORMAL HIGH (ref 11.5–15.5)
WBC: 8.1 10*3/uL (ref 4.0–10.5)
nRBC: 0 % (ref 0.0–0.2)

## 2022-07-17 LAB — BASIC METABOLIC PANEL
Anion gap: 4 — ABNORMAL LOW (ref 5–15)
BUN: 17 mg/dL (ref 8–23)
CO2: 26 mmol/L (ref 22–32)
Calcium: 8.3 mg/dL — ABNORMAL LOW (ref 8.9–10.3)
Chloride: 108 mmol/L (ref 98–111)
Creatinine, Ser: 1.44 mg/dL — ABNORMAL HIGH (ref 0.61–1.24)
GFR, Estimated: 51 mL/min — ABNORMAL LOW (ref >60)
Glucose, Bld: 86 mg/dL (ref 70–99)
Potassium: 3.9 mmol/L (ref 3.5–5.1)
Sodium: 138 mmol/L (ref 135–145)

## 2022-07-17 LAB — GLUCOSE, CAPILLARY
Glucose-Capillary: 126 mg/dL — ABNORMAL HIGH (ref 70–99)
Glucose-Capillary: 86 mg/dL (ref 70–99)
Glucose-Capillary: 91 mg/dL (ref 70–99)
Glucose-Capillary: 94 mg/dL (ref 70–99)

## 2022-07-17 LAB — MAGNESIUM: Magnesium: 1.6 mg/dL — ABNORMAL LOW (ref 1.7–2.4)

## 2022-07-17 MED ORDER — ASPIRIN ADULT LOW STRENGTH 81 MG PO TBEC: 81.0000 mg | DELAYED_RELEASE_TABLET | Freq: Every day | ORAL | 12 refills | Status: DC

## 2022-07-17 MED ORDER — VITAMIN B-12 1000 MCG PO TABS: 1000.0000 ug | ORAL_TABLET | Freq: Every day | ORAL | Status: DC

## 2022-07-17 MED ORDER — MAGNESIUM SULFATE 2 GM/50ML IV SOLN
2.0000 g | Freq: Once | INTRAVENOUS | Status: AC
Start: 2022-07-17 — End: 2022-07-17
  Administered 2022-07-17: 2 g via INTRAVENOUS
  Filled 2022-07-17: qty 50

## 2022-07-17 NOTE — Discharge Summary (Signed)
Physician Discharge Summary  [name] ALP:[id number] DOB: 08/22/1946 DOA: 07/14/2022  PCP: Jason Morales, DO  Admit date: 07/14/2022 Discharge date: 07/17/2022  Admitted From: SNF  Disposition:  SNF  Recommendations for Outpatient Follow-up:  Follow up with PCP in 1-2 weeks Please obtain BMP/CBC in one week Follow up with GI in the next 3-4 weeks   Discharge Condition:stable CODE STATUS:DNR Diet recommendation: heart healthy, carb modified  Brief/Interim Summary: [name]  is a 76 y.o. male, with a past medical history significant for Barret's esophagus, BPH, parotid gland cancer (adenocarcinoma), chronic abdominal pain, history of CVA with residual right hemiparesis and dysarthria, delayed gastric emptying, type 2 diabetes, GERD, hyperlipidemia and diverticulosis. -Patient presents to ED secondary to complaints of coffee-ground emesis episode x1, patient reports she is on aspirin, denies any NSAIDs use, presents with 1 episode of coffee-ground emesis, some nausea, currently denies any further nausea, no diarrhea, has chronic constipation and abdominal pain, no fever or chills, no epistaxis, no chest pain or shortness of breath, she is recently moved to [facility] given limited mobility is likely from right-sided hemiparesis from previous CVA. - in ED creatinine was at baseline 1.73, hemoglobin is 9.7, down from 11.8 last year, signs are stable, ED discussed with GI who recommended clear liquids for today, and endoscopy in a.m., [facility]ist consulted to admit.  Discharge Diagnoses:  Principal Problem:   Coffee ground emesis Active Problems:   Type 2 diabetes mellitus (HCC)   Gout   BARRETTS ESOPHAGUS   GERD (gastroesophageal reflux disease)   Chronic constipation   MGUS (monoclonal gammopathy of unknown significance)  Coffee-ground emesis Upper GI bleed Acute blood loss anemia -Patient presents with coffee-ground emesis, most likely due to upper GI  bleed, denies any NSAIDs use, but he is on aspirin. -Endoscopy in 2017 significant for Barrett's esophagus. -Had an episode of lower GI bleed in 2021 thought to be secondary to ischemic colitis, but no colonoscopy was done then -he was treated with PPI -GI consulted, s/p EGD EGD: done on 07/15/22 - Normal esophagus. Multiple gastric polyps. 1 large hemorrhagic polyp straddling the diaphragmatic hiatus removed with hot snare cautery; polypectomy site clipped -Markedly inflamed appearing gastric mucosa -status post biopsy. Duodenal polypoid mass?status post biopsy - Patient has a contact number available for emergencies. The signs and symptoms of potential delayed complications were discussed with the patient. Return to normal activities tomorrow. Written discharge instructions were provided to the patient. - Return patient to hospital ward for ongoing care. - Clear liquid diet, advanced to soft diet - Continue present medications. Continue on daily PPI. Follow-up on pathology. No MRI until clips gone. GI called [name] at [phone] -but he was unable to reach. Hb 9.1---8.3--8.8 stable  continue to monitor H&H and transfuse if hemoglobin less than 7       Mild iron deficiency, started oral iron supplement.  Repeat iron profile after 3 months and follow with PCP   Vitamin B12 deficiency, B12 level on admission 124. Started vitamin B12 1000 mcg IM injection daily during hospital stay followed by oral supplement.  Follow with PCP to repeat a vitamin B12 level after 3 months.     CKD stage IIIa Monoclonal gammopathy of unknown significance -Renal function at baseline, continue to monitor closely   Hypertension -Mildly elevated, continue with home medications    GERD -Continue with PPI   Hyperlipidemia -Continue with home statin   BPH -Continue with Flomax   Type 2 diabetes with nephropathy -  continue on basal insulin -blood sugars stable   History  of stroke -Right  side contracted  -Resume aspirin in 1 week,  continue with statin    Abnormal CT abdomen pelvis with abnormal lesion finding in liver, spleen and kidneys. - MRI abdomen was obtained which was reassuring Splenic lesion, patient needs follow-up in 6 months, recommended to follow with PCP for further work-up as an outpatient   Constipation -CT abdomen and pelvis with large stool burden, started on bowel regimen Discharge on Amitiza to prevent constipation as per GI Patient is not a good candidate for colonoscopy.   Body mass index is 30.52 kg/m.  Interventions:   Pressure Injury 03/03/20 Buttocks Right;Medial Stage 2 -  Partial thickness loss of dermis presenting as a shallow open injury with a red, pink wound bed without slough. (Active)  03/03/20 0930  Location: Buttocks  Location Orientation: Right;Medial  Staging: Stage 2 -  Partial thickness loss of dermis presenting as a shallow open injury with a red, pink wound bed without slough.  Wound Description (Comments):   Present on Admission: Yes     Pressure Injury 03/03/20 Hip Left Stage 2 -  Partial thickness loss of dermis presenting as a shallow open injury with a red, pink wound bed without slough. (Active)  03/03/20 0930  Location: Hip  Location Orientation: Left  Staging: Stage 2 -  Partial thickness loss of dermis presenting as a shallow open injury with a red, pink wound bed without slough.  Wound Description (Comments):   Present on Admission: Yes    Discharge Instructions   Allergies as of 07/17/2022   No Known Allergies      Medication List     STOP taking these medications    furosemide 80 MG tablet Commonly known as: LASIX       TAKE these medications    allopurinol 300 MG tablet Commonly known as: ZYLOPRIM TAKE 1 TABLET(300 MG) BY MOUTH DAILY What changed:  how much to take how to take this when to take this additional instructions   amLODipine 10 MG tablet Commonly known as: NORVASC Take 1  tablet (10 mg total) by mouth daily.   Aspirin Adult Low Strength 81 MG tablet Generic drug: aspirin EC Take 1 tablet (81 mg total) by mouth daily. Restart in 1 week What changed: additional instructions   buPROPion ER 100 MG 12 hr tablet Commonly known as: WELLBUTRIN SR Take 100 mg by mouth 2 (two) times daily.   cinacalcet 30 MG tablet Commonly known as: SENSIPAR Take 30 mg by mouth daily with breakfast.   cloNIDine 0.2 MG tablet Commonly known as: CATAPRES Take 1 tablet (0.2 mg total) by mouth 3 (three) times daily. TAKE 1 TABLET(0.2 MG) BY MOUTH THREE TIMES DAILY What changed:  how much to take when to take this additional instructions   cyanocobalamin 1000 MCG tablet Commonly known as: VITAMIN B12 Take 1 tablet (1,000 mcg total) by mouth daily.   gabapentin 300 MG capsule Commonly known as: NEURONTIN Take 2 capsules (600 mg total) by mouth 2 (two) times daily. May also take 2 capsules (600 mg total) daily as needed. What changed: See the new instructions.   iron polysaccharides 150 MG capsule Commonly known as: NIFEREX TAKE 1 CAPSULE(150 MG) BY MOUTH DAILY What changed: See the new instructions.   Lantus SoloStar 100 UNIT/ML Solostar Pen Generic drug: insulin glargine Inject 30 Units into the skin at bedtime. What changed: how much to take   lisinopril 20  MG tablet Commonly known as: ZESTRIL TAKE 1 TABLET(20 MG) BY MOUTH DAILY What changed:  how much to take how to take this when to take this additional instructions   lubiprostone 24 MCG capsule Commonly known as: AMITIZA TAKE 1 CAPSULE BY MOUTH TWICE DAILY WITH MEALS What changed:  how much to take how to take this when to take this additional instructions   metoprolol tartrate 50 MG tablet Commonly known as: LOPRESSOR Take 50 mg by mouth daily.   OneTouch Delica Plus [id number] Misc USE TO TEST BLOOD SUGAR FOUR TIMES DAILY   OneTouch Verio test strip Generic drug: glucose blood Use as  directed to check Blood Glucose twice daily   oxyCODONE-acetaminophen 7.5-325 MG tablet Commonly known as: Percocet Take 1 tablet by mouth every 4 (four) hours as needed. What changed:  when to take this reasons to take this   pantoprazole 40 MG tablet Commonly known as: PROTONIX TAKE 1 TABLET(40 MG) BY MOUTH DAILY What changed:  how much to take how to take this when to take this additional instructions   simvastatin 20 MG tablet Commonly known as: ZOCOR TAKE 1 TABLET(20 MG) BY MOUTH AT BEDTIME What changed:  how much to take how to take this when to take this additional instructions   tamsulosin 0.4 MG Caps capsule Commonly known as: FLOMAX TAKE 1 CAPSULE(0.4 MG) BY MOUTH DAILY What changed:  how much to take how to take this when to take this additional instructions   traZODone 100 MG tablet Commonly known as: DESYREL Take 100 mg by mouth at bedtime.        Contact information for after-discharge care     [facility] .   Service: Skilled Nursing Contact information: [place] [phone]                    No Known Allergies  Consultations: GI   Procedures/Studies: MR Abdomen W or Wo Contrast  Result Date: 07/14/2022 CLINICAL DATA:  A 76 year old male presents for evaluation of renal lesions that were discovered on a recent CT evaluation. EXAM: MRI ABDOMEN WITHOUT AND WITH CONTRAST TECHNIQUE: Multiplanar multisequence MR imaging of the abdomen was performed both before and after the administration of intravenous contrast. CONTRAST:  43m GADAVIST GADOBUTROL 1 MMOL/ML IV SOLN COMPARISON:  Is CT angiography evaluation of the same date. FINDINGS: Lower chest: No effusion or consolidative process. Limited assessment by MRI of the lung bases. Hepatobiliary: Post cholecystectomy.  No biliary duct dilation. Portal vein is patent. Numerous tiny hepatic cysts or  biliary hamartoma de, benign lesions which require no additional follow-up are scattered throughout the hepatic parenchyma. In the area of concern in the posterior RIGHT hemiliver there is no visible lesion, presumably a small transient hepatic attenuation difference on the prior CT. Pancreas: Normal intrinsic T1 signal. No ductal dilation or sign of inflammation. No focal lesion. Spleen: Normal contour of the spleen. Subtle hypointense lesion in the splenic parenchyma on T2, not visible on baseline precontrast T1 weighted imaging and faintly visible on early postcontrast imaging becoming less conspicuous on later phase imaging. Not seen in 2019 and hypointense relative to adjacent spleen on T2 weighted images at 8 mm, smaller than the measured lesion on CT. Adrenals/Urinary Tract: In the bilateral kidneys there are simple appearing renal cysts compatible with Bosniak category 1 cysts bilaterally. Largest of these cysts is present in the RIGHT kidney measuring 4.5 cm  greatest axial dimension. There is no mural nodularity or internal enhancement. These are compatible with benign lesions for which no additional follow-up imaging dedicated is recommended Stomach/Bowel: No acute gastrointestinal process to the extent evaluated on this abdominal MRI with small to moderate hiatal hernia. Vascular/Lymphatic: No signs of vascular dilation. No adenopathy in the abdomen. Other:  No ascites Musculoskeletal: Compression fracture at L1 and vertebral hemangioma at L2 similar to previous imaging. IMPRESSION: 1. Benign lesions in both liver and kidneys which require no additional follow-up as outlined above. 2. Subtle hypointense lesion in the splenic parenchyma on T2, not visible in 2019 and hypointense relative to adjacent spleen on T2 weighted images. This may represent a benign lesion such as a splenic hamartoma or hemangioma. The possibility of sclerosing angiomatous nodular transformation of the spleen is considered. Consider  six-month follow-up to ensure stability. 3. Small to moderate hiatal hernia. Electronically Signed   By: Zetta Bills M.D.   On: 07/14/2022 14:29   CT ANGIO ABDOMEN PELVIS  W &/OR WO CONTRAST  Result Date: 07/14/2022 CLINICAL DATA:  76 year old coffee-ground emesis. Right abdominal pain. EXAM: CTA ABDOMEN AND PELVIS WITHOUT AND WITH CONTRAST TECHNIQUE: Multidetector CT imaging of the abdomen and pelvis was performed using the standard protocol during bolus administration of intravenous contrast. Multiplanar reconstructed images and MIPs were obtained and reviewed to evaluate the vascular anatomy. RADIATION DOSE REDUCTION: This exam was performed according to the departmental dose-optimization program which includes automated exposure control, adjustment of the mA and/or kV according to patient size and/or use of iterative reconstruction technique. CONTRAST:  161m OMNIPAQUE IOHEXOL 350 MG/ML SOLN COMPARISON:  CT abdomen and pelvis 03/03/2020 FINDINGS: VASCULAR Aorta: Atherosclerotic calcifications in the infrarenal abdominal aorta. Negative for an abdominal aortic aneurysm. No evidence for dissection or stenosis in the abdominal aorta. Celiac: Patent without evidence of aneurysm, dissection, vasculitis or significant stenosis. SMA: Patent without evidence of aneurysm, dissection, vasculitis or significant stenosis. Renals: Both renal arteries are patent without evidence of aneurysm, dissection, vasculitis, fibromuscular dysplasia or significant stenosis. IMA: Patent Inflow: Atherosclerotic calcifications involving bilateral iliac arteries. Right common and external iliac arteries are patent without significant stenosis. Stenosis at the origin of the right internal iliac artery. At least mild stenosis in the distal left common iliac artery. Left internal iliac artery is patent. At least mild narrowing in the proximal left external iliac artery. Proximal Outflow: Proximal femoral arteries are patent bilaterally.  Veins: IVC and iliac veins are patent. Portal venous system is patent. Review of the MIP images confirms the above findings. NON-VASCULAR Lower chest: Bronchiectasis in the right lower lobe. No pleural effusions. Hepatobiliary: Subtle small hypodensities in liver probably represent small cysts but too small to definitively characterize. Peripheral hypervascular area in the right hepatic lobe on the arterial phase imaging on sequence 5 image 48. This is probably related to transient hepatic attenuation difference. Subtle enhancing exophytic area or lesion in segment 4 B on sequence 5 image 68 that measures roughly 0.8 cm. Gallbladder has been removed. Pancreas: Unremarkable. No pancreatic ductal dilatation or surrounding inflammatory changes. Spleen: Normal in size. 1.5 cm hypodensity along the superior aspect of the spleen which is not clearly identified on the portal venous phase. This is probably an incidental finding but indeterminate. Adrenals/Urinary Tract: Chronic bilateral adrenal gland thickening which may represent hyperplasia. Evidence for numerous bilateral renal cysts. Small hyperdensities along the right kidney lower pole could represent a complex or hyperdense cyst but difficult to characterize due to small size. 7 mm  exophytic hypodensity in the right kidney lower pole on sequence 9 image 58 has Hounsfield units of around 41 on noncontrast images and Hounsfield units are 62 on the portal venous phase imaging. Difficult to exclude minimal enhancement in this area. There also hyperdense structures involving the left kidney. Negative for kidney stones or hydronephrosis. No ureter calcifications. Minimal enhancement or wall thickening along the posterolateral aspect of the bladder near the right ureterovesical junction on sequence 6, image 74. Stomach/Bowel: Rectum is distended with stool. Large amount of stool throughout the colon. Prominent colonic diverticula involving the descending colon and sigmoid  colon. Normal appendix without inflammatory changes. Normal appearance of the stomach. No evidence for bowel obstruction. No focal bowel inflammation. No evidence for active GI bleeding. Lymphatic: No lymph node enlargement in the abdomen or pelvis. Reproductive: Prostate is slightly enlarged. Other: Probable bilateral small inguinal hernias containing fat. Small periumbilical hernia containing fat. Negative for free fluid. Negative for free air. Musculoskeletal: Bone cement within the L1 vertebral body and evidence of previous vertebral body compression fracture. Again noted is heterogeneity throughout the L2 vertebral body with prominent inferior Schmorl's node. Progressive degenerative endplate changes at V4-Q5. No acute bone abnormality. IMPRESSION: VASCULAR 1. No evidence for active GI bleeding. 2. Atherosclerotic disease involving the aorta and iliac arteries. At least mild stenosis in left common iliac artery and left external iliac artery as described. NON-VASCULAR 1. Large amount of stool throughout the colon, particularly in the rectum. No evidence for acute bowel inflammation. 2. Indeterminate findings involving the liver, kidneys and spleen. Hypervascular areas in the liver could represent transient attenuation differences but indeterminate. There are multiple bilateral renal cysts but some of these presumed cysts are hyperdense and concern for mild enhancement involving a right kidney lower pole hyperdense structure as described. There is also an indeterminate splenic lesion on the arterial phase imaging. Recommend further characterization of these indeterminate abdominal findings with an abdominal MRI, with and without contrast. 3. Subtle enhancement and question mild wall thickening along the right posterolateral bladder near the right ureterovesical junction. This finding is nonspecific and consider non emergent urology consultation. Electronically Signed   By: Markus Daft M.D.   On: 07/14/2022 13:11       Subjective:   Discharge Exam: Vitals:   07/16/22 0836 07/16/22 1248 07/16/22 2110 07/17/22 0511  BP: (!) 160/97 (!) 124/93 (!) 142/92 (!) 153/91  Pulse: 91 71 77 82  Resp:  16 16 16   Temp:  98.5 F (36.9 C) 98.7 F (37.1 C) 98.7 F (37.1 C)  TempSrc:  Oral Oral Oral  SpO2:  100% 98% 100%  Weight:      Height:        General: Pt is alert, awake, not in acute distress Cardiovascular: RRR, S1/S2 +, no rubs, no gallops Respiratory: CTA bilaterally, no wheezing, no rhonchi Abdominal: Soft, NT, ND, bowel sounds + Extremities: no edema, no cyanosis    The results of significant diagnostics from this hospitalization (including imaging, microbiology, ancillary and laboratory) are listed below for reference.     Microbiology: No results found for this or any previous visit (from the past 240 hour(s)).   Labs: BNP (last 3 results) Recent Labs    09/14/21 1318  BNP 5   Basic Metabolic Panel: Recent Labs  Lab 07/14/22 0904 07/15/22 0446 07/16/22 0454 07/17/22 0400  NA 140 141 141 138  K 4.0 3.7 4.0 3.9  CL 107 110 110 108  CO2 28 27 26  26  GLUCOSE 163* 97 80 86  BUN 23 20 18 17   CREATININE 1.73* 1.53* 1.42* 1.44*  CALCIUM 8.9 8.9 8.6* 8.3*  MG  --  1.8 1.7 1.6*  PHOS  --  3.1 2.9 2.8   Liver Function Tests: Recent Labs  Lab 07/14/22 0904  AST 9*  ALT 8  ALKPHOS 98  BILITOT 0.4  PROT 7.3  ALBUMIN 3.0*   Recent Labs  Lab 07/14/22 0904  LIPASE 27   No results for input(s): "AMMONIA" in the last 168 hours. CBC: Recent Labs  Lab 07/14/22 0904 07/14/22 2032 07/15/22 0446 07/15/22 1953 07/16/22 0454 07/17/22 0400  WBC 14.6* 10.3 8.4  --  8.0 8.1  NEUTROABS  --   --  6.1  --   --   --   HGB 9.7* 9.1* 8.3* 9.5* 8.8* 8.8*  HCT 32.4* 29.7* 27.9* 31.8* 29.5* 29.3*  MCV 73.0* 73.0* 73.4*  --  73.2* 73.8*  PLT 321 345 270  --  265 256   Cardiac Enzymes: No results for input(s): "CKTOTAL", "CKMB", "CKMBINDEX", "TROPONINI" in the last 168  hours. BNP: Invalid input(s): "POCBNP" CBG: Recent Labs  Lab 07/16/22 2105 07/17/22 0043 07/17/22 0403 07/17/22 0755 07/17/22 1143  GLUCAP 88 86 91 94 126*   D-Dimer No results for input(s): "DDIMER" in the last 72 hours. Hgb A1c Recent Labs    07/15/22 0446  HGBA1C 5.0   Lipid Profile No results for input(s): "CHOL", "HDL", "LDLCALC", "TRIG", "CHOLHDL", "LDLDIRECT" in the last 72 hours. Thyroid function studies No results for input(s): "TSH", "T4TOTAL", "T3FREE", "THYROIDAB" in the last 72 hours.  Invalid input(s): "FREET3" Anemia work up No results for input(s): "VITAMINB12", "FOLATE", "FERRITIN", "TIBC", "IRON", "RETICCTPCT" in the last 72 hours. Urinalysis    Component Value Date/Time   COLORURINE YELLOW 07/14/2022 1215   APPEARANCEUR HAZY (A) 07/14/2022 1215   LABSPEC 1.016 07/14/2022 1215   PHURINE 7.0 07/14/2022 1215   GLUCOSEU NEGATIVE 07/14/2022 1215   HGBUR NEGATIVE 07/14/2022 1215   BILIRUBINUR NEGATIVE 07/14/2022 1215   KETONESUR NEGATIVE 07/14/2022 1215   PROTEINUR 100 (A) 07/14/2022 1215   UROBILINOGEN 0.2 06/20/2014 0135   NITRITE NEGATIVE 07/14/2022 1215   LEUKOCYTESUR TRACE (A) 07/14/2022 1215   Sepsis Labs Recent Labs  Lab 07/14/22 2032 07/15/22 0446 07/16/22 0454 07/17/22 0400  WBC 10.3 8.4 8.0 8.1   Microbiology No results found for this or any previous visit (from the past 240 hour(s)).   Time coordinating discharge: 57mns  SIGNED:   JKathie Dike MD  [facility] 07/17/2022, 12:00 PM   If 7PM-7AM, please contact night-coverage [email or web address]

## 2022-07-17 NOTE — TOC Transition Note (Addendum)
Transition of Care [facility]) - CM/SW Discharge Note   Patient Details  Name: [name] MRN: [id number] Date of Birth: [birth year]  [facility] CM/SW Contact:  Iona Beard, [place] Phone Number: 07/17/2022, 1:32 PM   Clinical Narrative:    CSW updated that pt is medically ready for D/C back to facility at this time. CSW attempted to reach pts wife for update, unable to at this time. CSW updated Debbie with [facility], she will provide CSW with room and report numbers. CSW provided RN with number for room and report. CSW to call for EMS when RN is ready.   Final next level of care: Long Term Nursing Home Barriers to Discharge: Barriers Resolved   Patient Goals and CMS Choice Patient states their goals for this hospitalization and ongoing recovery are:: return to facility      Discharge Placement                Patient to be transferred to facility by: EMS Name of family member notified: spouse Patient and family notified of of transfer: 07/17/22  Discharge Plan and Services     Post Acute Care Choice: [place]                               Social Determinants of Health (SDOH) Interventions     Readmission Risk Interventions    07/15/2022    2:16 PM  Readmission Risk Prevention Plan  Transportation Screening Complete  PCP or Specialist Appt within 5-7 Days Complete

## 2022-07-18 DIAGNOSIS — I10 Essential (primary) hypertension: Secondary | ICD-10-CM | POA: Diagnosis not present

## 2022-07-18 DIAGNOSIS — D649 Anemia, unspecified: Secondary | ICD-10-CM | POA: Diagnosis not present

## 2022-07-18 DIAGNOSIS — I2510 Atherosclerotic heart disease of native coronary artery without angina pectoris: Secondary | ICD-10-CM | POA: Diagnosis not present

## 2022-07-18 DIAGNOSIS — M6281 Muscle weakness (generalized): Secondary | ICD-10-CM | POA: Diagnosis not present

## 2022-07-18 DIAGNOSIS — M109 Gout, unspecified: Secondary | ICD-10-CM | POA: Diagnosis not present

## 2022-07-18 DIAGNOSIS — I890 Lymphedema, not elsewhere classified: Secondary | ICD-10-CM | POA: Diagnosis not present

## 2022-07-18 DIAGNOSIS — E119 Type 2 diabetes mellitus without complications: Secondary | ICD-10-CM | POA: Diagnosis not present

## 2022-07-18 DIAGNOSIS — K922 Gastrointestinal hemorrhage, unspecified: Secondary | ICD-10-CM | POA: Diagnosis not present

## 2022-07-18 DIAGNOSIS — R293 Abnormal posture: Secondary | ICD-10-CM | POA: Diagnosis not present

## 2022-07-18 DIAGNOSIS — I69351 Hemiplegia and hemiparesis following cerebral infarction affecting right dominant side: Secondary | ICD-10-CM | POA: Diagnosis not present

## 2022-07-18 DIAGNOSIS — D0220 Carcinoma in situ of unspecified bronchus and lung: Secondary | ICD-10-CM | POA: Diagnosis not present

## 2022-07-19 LAB — SURGICAL PATHOLOGY

## 2022-07-20 ENCOUNTER — Encounter (HOSPITAL_COMMUNITY): Payer: Self-pay | Admitting: Internal Medicine

## 2022-07-20 DIAGNOSIS — D0220 Carcinoma in situ of unspecified bronchus and lung: Secondary | ICD-10-CM | POA: Diagnosis not present

## 2022-07-20 DIAGNOSIS — I890 Lymphedema, not elsewhere classified: Secondary | ICD-10-CM | POA: Diagnosis not present

## 2022-07-20 DIAGNOSIS — M6281 Muscle weakness (generalized): Secondary | ICD-10-CM | POA: Diagnosis not present

## 2022-07-20 DIAGNOSIS — I69351 Hemiplegia and hemiparesis following cerebral infarction affecting right dominant side: Secondary | ICD-10-CM | POA: Diagnosis not present

## 2022-07-20 DIAGNOSIS — R293 Abnormal posture: Secondary | ICD-10-CM | POA: Diagnosis not present

## 2022-07-21 ENCOUNTER — Emergency Department (HOSPITAL_COMMUNITY)
Admission: EM | Admit: 2022-07-21 | Discharge: 2022-07-21 | Disposition: A | Discharge status: Skilled Nursing Facility | Payer: Medicare Other | Attending: Emergency Medicine | Admitting: Emergency Medicine

## 2022-07-21 ENCOUNTER — Other Ambulatory Visit: Payer: Self-pay

## 2022-07-21 DIAGNOSIS — L0291 Cutaneous abscess, unspecified: Secondary | ICD-10-CM

## 2022-07-21 DIAGNOSIS — R293 Abnormal posture: Secondary | ICD-10-CM | POA: Diagnosis not present

## 2022-07-21 DIAGNOSIS — E119 Type 2 diabetes mellitus without complications: Secondary | ICD-10-CM | POA: Diagnosis not present

## 2022-07-21 DIAGNOSIS — R109 Unspecified abdominal pain: Secondary | ICD-10-CM | POA: Diagnosis not present

## 2022-07-21 DIAGNOSIS — Z794 Long term (current) use of insulin: Secondary | ICD-10-CM | POA: Diagnosis not present

## 2022-07-21 DIAGNOSIS — Z79899 Other long term (current) drug therapy: Secondary | ICD-10-CM | POA: Insufficient documentation

## 2022-07-21 DIAGNOSIS — D0220 Carcinoma in situ of unspecified bronchus and lung: Secondary | ICD-10-CM | POA: Diagnosis not present

## 2022-07-21 DIAGNOSIS — I890 Lymphedema, not elsewhere classified: Secondary | ICD-10-CM | POA: Diagnosis not present

## 2022-07-21 DIAGNOSIS — R69 Illness, unspecified: Secondary | ICD-10-CM | POA: Diagnosis not present

## 2022-07-21 DIAGNOSIS — R52 Pain, unspecified: Secondary | ICD-10-CM | POA: Diagnosis not present

## 2022-07-21 DIAGNOSIS — Z7982 Long term (current) use of aspirin: Secondary | ICD-10-CM | POA: Diagnosis not present

## 2022-07-21 DIAGNOSIS — M6281 Muscle weakness (generalized): Secondary | ICD-10-CM | POA: Diagnosis not present

## 2022-07-21 DIAGNOSIS — L02212 Cutaneous abscess of back [any part, except buttock]: Secondary | ICD-10-CM | POA: Diagnosis not present

## 2022-07-21 DIAGNOSIS — I69351 Hemiplegia and hemiparesis following cerebral infarction affecting right dominant side: Secondary | ICD-10-CM | POA: Diagnosis not present

## 2022-07-21 LAB — CBG MONITORING, ED: Glucose-Capillary: 115 mg/dL — ABNORMAL HIGH (ref 70–99)

## 2022-07-21 MED ORDER — LIDOCAINE HCL (PF) 1 % IJ SOLN
30.0000 mL | Freq: Once | INTRAMUSCULAR | Status: AC
  Administered 2022-07-21: 30 mL
  Filled 2022-07-21: qty 30

## 2022-07-21 MED ORDER — POVIDONE-IODINE 10 % EX SOLN
CUTANEOUS | Status: AC
  Filled 2022-07-21: qty 14.8

## 2022-07-21 MED ORDER — DOXYCYCLINE HYCLATE 100 MG PO CAPS: 100.0000 mg | ORAL_CAPSULE | Freq: Two times a day (BID) | ORAL | 0 refills | Status: AC

## 2022-07-21 NOTE — Discharge Instructions (Addendum)
You have been seen today for your complaint of abscess incision and drainage on your back. Your discharge medications include doxycycline. This is an antibiotic. Follow the directions on the bottle. You may get an upset stomach with this medication. That is normal. Home care instructions are as follows:  Keep the area clean and dry until the wound is healed. The packing may come out on its own. Otherwise you may take this out in 5 days. You should instruct the assisted living home to roll you onto your side and alternate this every few hours. They may place pillows beneath your buttocks for support and comfort. Follow up with: your PCP in 1 week Please seek immediate medical care if you develop any of the following symptoms: Fever, chills, shortness of breath, chest pain, increase in pain at the abscess site  At this time there does not appear to be the presence of an emergent medical condition, however there is always the potential for conditions to change. Please read and follow the below instructions.  Do not take your medicine if  develop an itchy rash, swelling in your mouth or lips, or difficulty breathing; call 911 and seek immediate emergency medical attention if this occurs.  You may review your lab tests and imaging results in their entirety on your MyChart account.  Please discuss all results of fully with your primary care provider and other specialist at your follow-up visit.  Note: Portions of this text may have been transcribed using voice recognition software. Every effort was made to ensure accuracy; however, inadvertent computerized transcription errors may still be present.

## 2022-07-21 NOTE — ED Notes (Signed)
I&D tray at bedside

## 2022-07-21 NOTE — ED Provider Notes (Signed)
[facility] EMERGENCY DEPARTMENT Provider Note   CSN: [id number] Arrival date & time: 07/21/22  1411     History  Chief Complaint  Patient presents with   Abscess    [name] is a 76 y.o. male.  Patient is a 76 year old with significant past medical history including CVA, insulin-dependent type 2 diabetes, hemiplegia who presents to the ED via EMS from an assisted living home with a complaint of an abscess on the back.  Wife [name] is present and provides most of the history.  She is also his legal guardian.  Patient states that the abscess has been present for 2 days.  Wife states that he was just discharged from the hospital after undergoing an EGD on Sunday.  Patient also reports pain in the buttocks and believes he may have an abscess in that region.  Patient is mostly bedbound but states that he rolls frequently in the assisted living home.  Denies fevers, chills, nausea, vomiting, new or worsening chest pain or shortness of breath.   Abscess      Home Medications Prior to Admission medications   Medication Sig Start Date End Date Taking? Authorizing Provider  allopurinol (ZYLOPRIM) 300 MG tablet TAKE 1 TABLET(300 MG) BY MOUTH DAILY Patient taking differently: Take 300 mg by mouth daily. 12/10/21   [name], MD  amLODipine (NORVASC) 10 MG tablet Take 1 tablet (10 mg total) by mouth daily. 12/10/21   [name], MD  ASPIRIN ADULT LOW STRENGTH 81 MG tablet Take 1 tablet (81 mg total) by mouth daily. Restart in 1 week 07/17/22   Kathie Dike, MD  buPROPion ER [facility] SR) 100 MG 12 hr tablet Take 100 mg by mouth 2 (two) times daily. Patient not taking: Reported on 07/14/2022 02/03/22   [provider]  cinacalcet (SENSIPAR) 30 MG tablet Take 30 mg by mouth daily with breakfast. 02/03/22   [provider]  cloNIDine (CATAPRES) 0.2 MG tablet Take 1 tablet (0.2 mg total) by mouth 3 (three) times daily. TAKE 1 TABLET(0.2 MG) BY MOUTH THREE  TIMES DAILY Patient taking differently: Take 0.1 mg by mouth 2 (two) times daily. 12/10/21   [name], MD  cyanocobalamin (VITAMIN B12) 1000 MCG tablet Take 1 tablet (1,000 mcg total) by mouth daily. 07/17/22   Kathie Dike, MD  gabapentin (NEURONTIN) 300 MG capsule Take 2 capsules (600 mg total) by mouth 2 (two) times daily. May also take 2 capsules (600 mg total) daily as needed. Patient taking differently: 600MG Twice a day 12/10/21   [name], MD  glucose blood [facility] VERIO) test strip Use as directed to check Blood Glucose twice daily 11/25/20   Brita Romp, NP  insulin glargine (LANTUS SOLOSTAR) 100 UNIT/ML Solostar Pen Inject 30 Units into the skin at bedtime. Patient taking differently: Inject 15 Units into the skin at bedtime. 03/04/21   Brita Romp, NP  iron polysaccharides (NIFEREX) 150 MG capsule TAKE 1 CAPSULE(150 MG) BY MOUTH DAILY Patient taking differently: Take 150 mg by mouth daily. 07/07/21   [name], MD  Lancets (ONETOUCH DELICA PLUS [id number]) MISC USE TO TEST BLOOD SUGAR FOUR TIMES DAILY 07/28/21   Cassandria Anger, MD  lisinopril (ZESTRIL) 20 MG tablet TAKE 1 TABLET(20 MG) BY MOUTH DAILY Patient taking differently: Take 20 mg by mouth daily. 06/18/21   [name], MD  lubiprostone (AMITIZA) 24 MCG capsule TAKE 1 CAPSULE BY MOUTH TWICE DAILY WITH MEALS Patient taking differently:  Take 24 mcg by mouth 2 (two) times daily with a meal. 04/07/22   [name], MD  metoprolol tartrate (LOPRESSOR) 50 MG tablet Take 50 mg by mouth daily. 12/10/21   [provider]  oxyCODONE-acetaminophen (PERCOCET) 7.5-325 MG tablet Take 1 tablet by mouth every 4 (four) hours as needed. Patient taking differently: Take 1 tablet by mouth every 6 (six) hours as needed for moderate pain. 08/10/21   [name], MD  pantoprazole (PROTONIX) 40 MG tablet TAKE 1 TABLET(40 MG) BY MOUTH DAILY Patient taking differently: Take 40 mg by  mouth daily. 05/21/21   [name], MD  simvastatin (ZOCOR) 20 MG tablet TAKE 1 TABLET(20 MG) BY MOUTH AT BEDTIME Patient taking differently: Take 20 mg by mouth daily at 6 PM. 05/21/21   [name], MD  tamsulosin (FLOMAX) 0.4 MG CAPS capsule TAKE 1 CAPSULE(0.4 MG) BY MOUTH DAILY Patient taking differently: Take 0.4 mg by mouth daily. 12/10/21   [name], MD  traZODone (DESYREL) 100 MG tablet Take 100 mg by mouth at bedtime. 03/07/22   [provider]      Allergies    Patient has no known allergies.    Review of Systems   Review of Systems  Physical Exam Updated Vital Signs BP (!) 157/99 (BP Location: Left Arm)   Pulse 77   Temp 98.5 F (36.9 C) (Oral)   Resp 18   Ht 6' (1.829 m)   Wt 85.7 kg   SpO2 97%   BMI 25.63 kg/m  Physical Exam Vitals and nursing note reviewed.  Constitutional:      General: He is not in acute distress.    Appearance: He is well-developed. He is not ill-appearing.  HENT:     Head: Normocephalic and atraumatic.     Mouth/Throat:     Mouth: Mucous membranes are moist.     Pharynx: Oropharynx is clear.  Eyes:     Conjunctiva/sclera: Conjunctivae normal.  Cardiovascular:     Rate and Rhythm: Normal rate and regular rhythm.     Heart sounds: No murmur heard. Pulmonary:     Effort: Pulmonary effort is normal. No respiratory distress.     Breath sounds: Normal breath sounds.  Abdominal:     Palpations: Abdomen is soft.     Tenderness: There is abdominal tenderness.  Musculoskeletal:        General: No swelling.     Cervical back: Normal range of motion and neck supple. No tenderness.  Skin:    General: Skin is warm and dry.     Capillary Refill: Capillary refill takes less than 2 seconds.     Findings: Lesion (2 cm x 3 cm fluctuant mass on the back medial to right scapula.  No erythema or induration) present.          Comments: No erythema, abscess, or other signs of cellulitis on patient buttocks.  Neurological:      Mental Status: He is alert. Mental status is at baseline.  Psychiatric:        Mood and Affect: Mood normal.     ED Results / Procedures / Treatments   Labs (all labs ordered are listed, but only abnormal results are displayed) Labs Reviewed  CBG MONITORING, ED    EKG None  Radiology No results found.  Procedures .Marland KitchenIncision and Drainage  Date/Time: 07/21/2022 4:30 PM  Performed by: Roylene Reason, PA-C Authorized by: Roylene Reason, PA-C   Consent:  Consent obtained:  Verbal   Consent given by:  Patient   Risks, benefits, and alternatives were discussed: yes     Risks discussed:  Bleeding, incomplete drainage, pain, damage to other organs and infection   Alternatives discussed:  No treatment Universal protocol:    Procedure explained and questions answered to patient or proxy's satisfaction: yes     Relevant documents present and verified: yes     Test results available : yes     Immediately prior to procedure, a time out was called: yes     Patient identity confirmed:  Verbally with patient Location:    Type:  Abscess   Location:  Trunk   Trunk location:  Back (Medial to right scapula) Pre-procedure details:    Skin preparation:  Betadine Sedation:    Sedation type:  None Anesthesia:    Anesthesia method:  Local infiltration   Local anesthetic:  Lidocaine 1% w/o epi (4 cc) Procedure type:    Complexity:  Complex Procedure details:    Ultrasound guidance: no     Needle aspiration: no     Incision types:  Single straight   Incision depth:  Subcutaneous   Wound management:  Probed and deloculated, irrigated with saline and extensive cleaning   Drainage:  Purulent   Drainage amount:  Copious   Wound treatment:  Wound left open   Packing materials:  1/4 in iodoform gauze   Amount 1/4" iodoform:  1 inch Post-procedure details:    Procedure completion:  Tolerated well, no immediate complications     Medications Ordered in ED Medications   lidocaine (PF) (XYLOCAINE) 1 % injection 30 mL (has no administration in time range)    ED Course/ Medical Decision Making/ A&P                           Medical Decision Making This patient presents to the ED for concern of abscess on the back.  The differential diagnosis includes abscess, cellulitis, epidermal inclusion cyst   Co morbidities that complicate the patient evaluation       Prior CVA with right-sided hemiplegia, previous abscess, insulin-dependent diabetes  My initial workup includes blood glucose and I&D  Additional history obtained from: Nursing notes from this visit. Family wife [name] is a legal guardian and provides much of the history. EMS, patient was brought in via EMS for from assisted living.  I ordered, reviewed and interpreted labs which include: Blood sugar 115  Hemodynamically stable and afebrile, I cleaned the skin with Betadine, anesthetized with lidocaine.  Direct infiltration, incised and expressed purulent drainage, broke up loculations, irrigated with sterile water, packed with iodoform.  Patient handled this well.  I will send a prescription for doxycycline.  Patient is at high risk for infection due to immobility and assisted living setting and insulin-dependent diabetes.  Patient reports possibility of an abscess on his buttock, I assessed the skin on his buttocks with the assistance of nurse tech and found no abscess or erythema.  Patient is mildly tender to the area.  Patient has large amount of talc powder.  I urged wife to talk to assisted living to have him rolled from side-to-side throughout the day.  At this time there does not appear to be any evidence of an acute emergency medical condition and the patient appears stable for discharge with appropriate outpatient follow up. Diagnosis was discussed with patient who verbalizes understanding of care plan and is agreeable to  discharge. I have discussed return precautions with patient and wife  [name] who verbalizes understanding. Patient encouraged to follow-up with their PCP within 1 week and to remove iodine packing in 3 days.. All questions answered.  Patient's case discussed with Dr. Sabra Heck who agrees with plan to discharge with follow-up.   Note: Portions of this report may have been transcribed using voice recognition software. Every effort was made to ensure accuracy; however, inadvertent computerized transcription errors may still be present.   Risk Prescription drug management.           Final Clinical Impression(s) / ED Diagnoses Final diagnoses:  None    Rx / DC Orders ED Discharge Orders     None         Nehemiah Massed 07/21/22 Kaylyn Layer, MD 07/24/22 [phone]

## 2022-07-21 NOTE — ED Notes (Signed)
Attempted to call report twice, unable to reach RN/LPN at at this time

## 2022-07-21 NOTE — ED Triage Notes (Signed)
Pt arrived via [facility] from [facility] c/o abscess on back that needs draining and possible abscess on buttocks

## 2022-07-22 DIAGNOSIS — D0220 Carcinoma in situ of unspecified bronchus and lung: Secondary | ICD-10-CM | POA: Diagnosis not present

## 2022-07-22 DIAGNOSIS — I890 Lymphedema, not elsewhere classified: Secondary | ICD-10-CM | POA: Diagnosis not present

## 2022-07-22 DIAGNOSIS — M6281 Muscle weakness (generalized): Secondary | ICD-10-CM | POA: Diagnosis not present

## 2022-07-22 DIAGNOSIS — I69351 Hemiplegia and hemiparesis following cerebral infarction affecting right dominant side: Secondary | ICD-10-CM | POA: Diagnosis not present

## 2022-07-22 DIAGNOSIS — L02212 Cutaneous abscess of back [any part, except buttock]: Secondary | ICD-10-CM | POA: Diagnosis not present

## 2022-07-22 DIAGNOSIS — R293 Abnormal posture: Secondary | ICD-10-CM | POA: Diagnosis not present

## 2022-07-25 DIAGNOSIS — D0220 Carcinoma in situ of unspecified bronchus and lung: Secondary | ICD-10-CM | POA: Diagnosis not present

## 2022-07-25 DIAGNOSIS — I890 Lymphedema, not elsewhere classified: Secondary | ICD-10-CM | POA: Diagnosis not present

## 2022-07-25 DIAGNOSIS — I69351 Hemiplegia and hemiparesis following cerebral infarction affecting right dominant side: Secondary | ICD-10-CM | POA: Diagnosis not present

## 2022-07-25 DIAGNOSIS — M6281 Muscle weakness (generalized): Secondary | ICD-10-CM | POA: Diagnosis not present

## 2022-07-25 DIAGNOSIS — R293 Abnormal posture: Secondary | ICD-10-CM | POA: Diagnosis not present

## 2022-07-26 DIAGNOSIS — I69351 Hemiplegia and hemiparesis following cerebral infarction affecting right dominant side: Secondary | ICD-10-CM | POA: Diagnosis not present

## 2022-07-26 DIAGNOSIS — I890 Lymphedema, not elsewhere classified: Secondary | ICD-10-CM | POA: Diagnosis not present

## 2022-07-26 DIAGNOSIS — M6281 Muscle weakness (generalized): Secondary | ICD-10-CM | POA: Diagnosis not present

## 2022-07-26 DIAGNOSIS — D0220 Carcinoma in situ of unspecified bronchus and lung: Secondary | ICD-10-CM | POA: Diagnosis not present

## 2022-07-26 DIAGNOSIS — R293 Abnormal posture: Secondary | ICD-10-CM | POA: Diagnosis not present

## 2022-07-26 DIAGNOSIS — G8190 Hemiplegia, unspecified affecting unspecified side: Secondary | ICD-10-CM | POA: Diagnosis not present

## 2022-07-27 DIAGNOSIS — D0220 Carcinoma in situ of unspecified bronchus and lung: Secondary | ICD-10-CM | POA: Diagnosis not present

## 2022-07-27 DIAGNOSIS — I69351 Hemiplegia and hemiparesis following cerebral infarction affecting right dominant side: Secondary | ICD-10-CM | POA: Diagnosis not present

## 2022-07-27 DIAGNOSIS — M6281 Muscle weakness (generalized): Secondary | ICD-10-CM | POA: Diagnosis not present

## 2022-07-27 DIAGNOSIS — R293 Abnormal posture: Secondary | ICD-10-CM | POA: Diagnosis not present

## 2022-07-27 DIAGNOSIS — I890 Lymphedema, not elsewhere classified: Secondary | ICD-10-CM | POA: Diagnosis not present

## 2022-07-28 DIAGNOSIS — K922 Gastrointestinal hemorrhage, unspecified: Secondary | ICD-10-CM | POA: Diagnosis not present

## 2022-07-28 DIAGNOSIS — K219 Gastro-esophageal reflux disease without esophagitis: Secondary | ICD-10-CM | POA: Diagnosis not present

## 2022-07-28 DIAGNOSIS — D0220 Carcinoma in situ of unspecified bronchus and lung: Secondary | ICD-10-CM | POA: Diagnosis not present

## 2022-07-28 DIAGNOSIS — D509 Iron deficiency anemia, unspecified: Secondary | ICD-10-CM | POA: Diagnosis not present

## 2022-07-28 DIAGNOSIS — I890 Lymphedema, not elsewhere classified: Secondary | ICD-10-CM | POA: Diagnosis not present

## 2022-07-28 DIAGNOSIS — R293 Abnormal posture: Secondary | ICD-10-CM | POA: Diagnosis not present

## 2022-07-28 DIAGNOSIS — E785 Hyperlipidemia, unspecified: Secondary | ICD-10-CM | POA: Diagnosis not present

## 2022-07-28 DIAGNOSIS — D7389 Other diseases of spleen: Secondary | ICD-10-CM | POA: Diagnosis not present

## 2022-07-28 DIAGNOSIS — N1831 Chronic kidney disease, stage 3a: Secondary | ICD-10-CM | POA: Diagnosis not present

## 2022-07-28 DIAGNOSIS — D472 Monoclonal gammopathy: Secondary | ICD-10-CM | POA: Diagnosis not present

## 2022-07-28 DIAGNOSIS — I69351 Hemiplegia and hemiparesis following cerebral infarction affecting right dominant side: Secondary | ICD-10-CM | POA: Diagnosis not present

## 2022-07-28 DIAGNOSIS — E538 Deficiency of other specified B group vitamins: Secondary | ICD-10-CM | POA: Diagnosis not present

## 2022-07-28 DIAGNOSIS — M6281 Muscle weakness (generalized): Secondary | ICD-10-CM | POA: Diagnosis not present

## 2022-07-29 DIAGNOSIS — D0220 Carcinoma in situ of unspecified bronchus and lung: Secondary | ICD-10-CM | POA: Diagnosis not present

## 2022-07-29 DIAGNOSIS — I69351 Hemiplegia and hemiparesis following cerebral infarction affecting right dominant side: Secondary | ICD-10-CM | POA: Diagnosis not present

## 2022-07-29 DIAGNOSIS — I890 Lymphedema, not elsewhere classified: Secondary | ICD-10-CM | POA: Diagnosis not present

## 2022-07-29 DIAGNOSIS — R293 Abnormal posture: Secondary | ICD-10-CM | POA: Diagnosis not present

## 2022-07-29 DIAGNOSIS — M6281 Muscle weakness (generalized): Secondary | ICD-10-CM | POA: Diagnosis not present

## 2022-08-01 DIAGNOSIS — M6281 Muscle weakness (generalized): Secondary | ICD-10-CM | POA: Diagnosis not present

## 2022-08-01 DIAGNOSIS — R634 Abnormal weight loss: Secondary | ICD-10-CM | POA: Diagnosis not present

## 2022-08-01 DIAGNOSIS — R293 Abnormal posture: Secondary | ICD-10-CM | POA: Diagnosis not present

## 2022-08-01 DIAGNOSIS — I69351 Hemiplegia and hemiparesis following cerebral infarction affecting right dominant side: Secondary | ICD-10-CM | POA: Diagnosis not present

## 2022-08-01 DIAGNOSIS — D0220 Carcinoma in situ of unspecified bronchus and lung: Secondary | ICD-10-CM | POA: Diagnosis not present

## 2022-08-01 DIAGNOSIS — I890 Lymphedema, not elsewhere classified: Secondary | ICD-10-CM | POA: Diagnosis not present

## 2022-08-02 DIAGNOSIS — M6281 Muscle weakness (generalized): Secondary | ICD-10-CM | POA: Diagnosis not present

## 2022-08-02 DIAGNOSIS — I69351 Hemiplegia and hemiparesis following cerebral infarction affecting right dominant side: Secondary | ICD-10-CM | POA: Diagnosis not present

## 2022-08-02 DIAGNOSIS — I890 Lymphedema, not elsewhere classified: Secondary | ICD-10-CM | POA: Diagnosis not present

## 2022-08-02 DIAGNOSIS — D0220 Carcinoma in situ of unspecified bronchus and lung: Secondary | ICD-10-CM | POA: Diagnosis not present

## 2022-08-02 DIAGNOSIS — R293 Abnormal posture: Secondary | ICD-10-CM | POA: Diagnosis not present

## 2022-08-03 DIAGNOSIS — D0220 Carcinoma in situ of unspecified bronchus and lung: Secondary | ICD-10-CM | POA: Diagnosis not present

## 2022-08-03 DIAGNOSIS — R293 Abnormal posture: Secondary | ICD-10-CM | POA: Diagnosis not present

## 2022-08-03 DIAGNOSIS — I69351 Hemiplegia and hemiparesis following cerebral infarction affecting right dominant side: Secondary | ICD-10-CM | POA: Diagnosis not present

## 2022-08-03 DIAGNOSIS — M6281 Muscle weakness (generalized): Secondary | ICD-10-CM | POA: Diagnosis not present

## 2022-08-03 DIAGNOSIS — I890 Lymphedema, not elsewhere classified: Secondary | ICD-10-CM | POA: Diagnosis not present

## 2022-08-05 DIAGNOSIS — D0220 Carcinoma in situ of unspecified bronchus and lung: Secondary | ICD-10-CM | POA: Diagnosis not present

## 2022-08-05 DIAGNOSIS — M6281 Muscle weakness (generalized): Secondary | ICD-10-CM | POA: Diagnosis not present

## 2022-08-05 DIAGNOSIS — R293 Abnormal posture: Secondary | ICD-10-CM | POA: Diagnosis not present

## 2022-08-05 DIAGNOSIS — I890 Lymphedema, not elsewhere classified: Secondary | ICD-10-CM | POA: Diagnosis not present

## 2022-08-05 DIAGNOSIS — I69351 Hemiplegia and hemiparesis following cerebral infarction affecting right dominant side: Secondary | ICD-10-CM | POA: Diagnosis not present

## 2022-08-08 DIAGNOSIS — M6281 Muscle weakness (generalized): Secondary | ICD-10-CM | POA: Diagnosis not present

## 2022-08-08 DIAGNOSIS — D0220 Carcinoma in situ of unspecified bronchus and lung: Secondary | ICD-10-CM | POA: Diagnosis not present

## 2022-08-08 DIAGNOSIS — I890 Lymphedema, not elsewhere classified: Secondary | ICD-10-CM | POA: Diagnosis not present

## 2022-08-08 DIAGNOSIS — I69351 Hemiplegia and hemiparesis following cerebral infarction affecting right dominant side: Secondary | ICD-10-CM | POA: Diagnosis not present

## 2022-08-08 DIAGNOSIS — R293 Abnormal posture: Secondary | ICD-10-CM | POA: Diagnosis not present

## 2022-08-09 DIAGNOSIS — I890 Lymphedema, not elsewhere classified: Secondary | ICD-10-CM | POA: Diagnosis not present

## 2022-08-09 DIAGNOSIS — I69351 Hemiplegia and hemiparesis following cerebral infarction affecting right dominant side: Secondary | ICD-10-CM | POA: Diagnosis not present

## 2022-08-09 DIAGNOSIS — M6281 Muscle weakness (generalized): Secondary | ICD-10-CM | POA: Diagnosis not present

## 2022-08-09 DIAGNOSIS — D0220 Carcinoma in situ of unspecified bronchus and lung: Secondary | ICD-10-CM | POA: Diagnosis not present

## 2022-08-09 DIAGNOSIS — R293 Abnormal posture: Secondary | ICD-10-CM | POA: Diagnosis not present

## 2022-08-10 DIAGNOSIS — M6281 Muscle weakness (generalized): Secondary | ICD-10-CM | POA: Diagnosis not present

## 2022-08-10 DIAGNOSIS — I890 Lymphedema, not elsewhere classified: Secondary | ICD-10-CM | POA: Diagnosis not present

## 2022-08-10 DIAGNOSIS — D0220 Carcinoma in situ of unspecified bronchus and lung: Secondary | ICD-10-CM | POA: Diagnosis not present

## 2022-08-10 DIAGNOSIS — R293 Abnormal posture: Secondary | ICD-10-CM | POA: Diagnosis not present

## 2022-08-10 DIAGNOSIS — I69351 Hemiplegia and hemiparesis following cerebral infarction affecting right dominant side: Secondary | ICD-10-CM | POA: Diagnosis not present

## 2022-08-11 DIAGNOSIS — D0220 Carcinoma in situ of unspecified bronchus and lung: Secondary | ICD-10-CM | POA: Diagnosis not present

## 2022-08-11 DIAGNOSIS — I69351 Hemiplegia and hemiparesis following cerebral infarction affecting right dominant side: Secondary | ICD-10-CM | POA: Diagnosis not present

## 2022-08-11 DIAGNOSIS — R293 Abnormal posture: Secondary | ICD-10-CM | POA: Diagnosis not present

## 2022-08-11 DIAGNOSIS — I890 Lymphedema, not elsewhere classified: Secondary | ICD-10-CM | POA: Diagnosis not present

## 2022-08-11 DIAGNOSIS — M6281 Muscle weakness (generalized): Secondary | ICD-10-CM | POA: Diagnosis not present

## 2022-08-15 DIAGNOSIS — I890 Lymphedema, not elsewhere classified: Secondary | ICD-10-CM | POA: Diagnosis not present

## 2022-08-15 DIAGNOSIS — R293 Abnormal posture: Secondary | ICD-10-CM | POA: Diagnosis not present

## 2022-08-15 DIAGNOSIS — I69351 Hemiplegia and hemiparesis following cerebral infarction affecting right dominant side: Secondary | ICD-10-CM | POA: Diagnosis not present

## 2022-08-15 DIAGNOSIS — M6281 Muscle weakness (generalized): Secondary | ICD-10-CM | POA: Diagnosis not present

## 2022-08-15 DIAGNOSIS — D0220 Carcinoma in situ of unspecified bronchus and lung: Secondary | ICD-10-CM | POA: Diagnosis not present

## 2022-08-16 DIAGNOSIS — M6281 Muscle weakness (generalized): Secondary | ICD-10-CM | POA: Diagnosis not present

## 2022-08-16 DIAGNOSIS — I890 Lymphedema, not elsewhere classified: Secondary | ICD-10-CM | POA: Diagnosis not present

## 2022-08-16 DIAGNOSIS — D0220 Carcinoma in situ of unspecified bronchus and lung: Secondary | ICD-10-CM | POA: Diagnosis not present

## 2022-08-16 DIAGNOSIS — R293 Abnormal posture: Secondary | ICD-10-CM | POA: Diagnosis not present

## 2022-08-16 DIAGNOSIS — I69351 Hemiplegia and hemiparesis following cerebral infarction affecting right dominant side: Secondary | ICD-10-CM | POA: Diagnosis not present

## 2022-08-17 DIAGNOSIS — R293 Abnormal posture: Secondary | ICD-10-CM | POA: Diagnosis not present

## 2022-08-17 DIAGNOSIS — D0220 Carcinoma in situ of unspecified bronchus and lung: Secondary | ICD-10-CM | POA: Diagnosis not present

## 2022-08-17 DIAGNOSIS — I69351 Hemiplegia and hemiparesis following cerebral infarction affecting right dominant side: Secondary | ICD-10-CM | POA: Diagnosis not present

## 2022-08-17 DIAGNOSIS — M6281 Muscle weakness (generalized): Secondary | ICD-10-CM | POA: Diagnosis not present

## 2022-08-17 DIAGNOSIS — I890 Lymphedema, not elsewhere classified: Secondary | ICD-10-CM | POA: Diagnosis not present

## 2022-08-18 DIAGNOSIS — D0220 Carcinoma in situ of unspecified bronchus and lung: Secondary | ICD-10-CM | POA: Diagnosis not present

## 2022-08-18 DIAGNOSIS — R293 Abnormal posture: Secondary | ICD-10-CM | POA: Diagnosis not present

## 2022-08-18 DIAGNOSIS — I69351 Hemiplegia and hemiparesis following cerebral infarction affecting right dominant side: Secondary | ICD-10-CM | POA: Diagnosis not present

## 2022-08-18 DIAGNOSIS — M6281 Muscle weakness (generalized): Secondary | ICD-10-CM | POA: Diagnosis not present

## 2022-08-18 DIAGNOSIS — I890 Lymphedema, not elsewhere classified: Secondary | ICD-10-CM | POA: Diagnosis not present

## 2022-08-19 DIAGNOSIS — R293 Abnormal posture: Secondary | ICD-10-CM | POA: Diagnosis not present

## 2022-08-19 DIAGNOSIS — I69351 Hemiplegia and hemiparesis following cerebral infarction affecting right dominant side: Secondary | ICD-10-CM | POA: Diagnosis not present

## 2022-08-19 DIAGNOSIS — M6281 Muscle weakness (generalized): Secondary | ICD-10-CM | POA: Diagnosis not present

## 2022-08-19 DIAGNOSIS — D0220 Carcinoma in situ of unspecified bronchus and lung: Secondary | ICD-10-CM | POA: Diagnosis not present

## 2022-08-19 DIAGNOSIS — I890 Lymphedema, not elsewhere classified: Secondary | ICD-10-CM | POA: Diagnosis not present

## 2022-08-22 DIAGNOSIS — R112 Nausea with vomiting, unspecified: Secondary | ICD-10-CM | POA: Diagnosis not present

## 2022-08-22 DIAGNOSIS — R293 Abnormal posture: Secondary | ICD-10-CM | POA: Diagnosis not present

## 2022-08-22 DIAGNOSIS — D0220 Carcinoma in situ of unspecified bronchus and lung: Secondary | ICD-10-CM | POA: Diagnosis not present

## 2022-08-22 DIAGNOSIS — I69351 Hemiplegia and hemiparesis following cerebral infarction affecting right dominant side: Secondary | ICD-10-CM | POA: Diagnosis not present

## 2022-08-22 DIAGNOSIS — I890 Lymphedema, not elsewhere classified: Secondary | ICD-10-CM | POA: Diagnosis not present

## 2022-08-22 DIAGNOSIS — M6281 Muscle weakness (generalized): Secondary | ICD-10-CM | POA: Diagnosis not present

## 2022-08-23 DIAGNOSIS — N390 Urinary tract infection, site not specified: Secondary | ICD-10-CM | POA: Diagnosis not present

## 2022-08-23 DIAGNOSIS — N281 Cyst of kidney, acquired: Secondary | ICD-10-CM | POA: Diagnosis not present

## 2022-08-24 ENCOUNTER — Ambulatory Visit (INDEPENDENT_AMBULATORY_CARE_PROVIDER_SITE_OTHER): Payer: Medicare Other | Admitting: Gastroenterology

## 2022-08-24 ENCOUNTER — Encounter: Payer: Self-pay | Admitting: Gastroenterology

## 2022-08-24 VITALS — BP 118/77 | HR 73 | Temp 97.1°F | Ht 73.0 in | Wt 195.0 lb

## 2022-08-24 DIAGNOSIS — K219 Gastro-esophageal reflux disease without esophagitis: Secondary | ICD-10-CM | POA: Diagnosis not present

## 2022-08-24 DIAGNOSIS — M6281 Muscle weakness (generalized): Secondary | ICD-10-CM | POA: Diagnosis not present

## 2022-08-24 DIAGNOSIS — R109 Unspecified abdominal pain: Secondary | ICD-10-CM | POA: Diagnosis not present

## 2022-08-24 DIAGNOSIS — R112 Nausea with vomiting, unspecified: Secondary | ICD-10-CM | POA: Diagnosis not present

## 2022-08-24 DIAGNOSIS — K5909 Other constipation: Secondary | ICD-10-CM | POA: Diagnosis not present

## 2022-08-24 DIAGNOSIS — G8929 Other chronic pain: Secondary | ICD-10-CM

## 2022-08-24 DIAGNOSIS — I890 Lymphedema, not elsewhere classified: Secondary | ICD-10-CM | POA: Diagnosis not present

## 2022-08-24 DIAGNOSIS — D0220 Carcinoma in situ of unspecified bronchus and lung: Secondary | ICD-10-CM | POA: Diagnosis not present

## 2022-08-24 DIAGNOSIS — D132 Benign neoplasm of duodenum: Secondary | ICD-10-CM | POA: Diagnosis not present

## 2022-08-24 DIAGNOSIS — D649 Anemia, unspecified: Secondary | ICD-10-CM | POA: Diagnosis not present

## 2022-08-24 DIAGNOSIS — R293 Abnormal posture: Secondary | ICD-10-CM | POA: Diagnosis not present

## 2022-08-24 DIAGNOSIS — I69351 Hemiplegia and hemiparesis following cerebral infarction affecting right dominant side: Secondary | ICD-10-CM | POA: Diagnosis not present

## 2022-08-24 MED ORDER — POLYETHYLENE GLYCOL 3350 17 GM/SCOOP PO POWD: ORAL | 5 refills | Status: AC

## 2022-08-24 NOTE — Patient Instructions (Addendum)
Start Miralax one capful daily. Hold for diarrhea. Continue Amitza 44mg twice daily. Continue pantoprazole 431mdaily. Please obtain a CBC with diff, send results to fax [phone] to see Dr. RoGala Romneyn 3 months.

## 2022-08-24 NOTE — Progress Notes (Signed)
GI Office Note    Referring Provider: Hal Morales, * Primary Care Physician:  Hal Morales, DO  Primary Gastroenterologist: Garfield Cornea, MD   Chief Complaint   Chief Complaint  Patient presents with   Follow-up    No bleeding, did have an episode of vomiting on Monday.     History of Present Illness   [name] is a 76 y.o. male presenting today for hospital follow-up.  He has a complex medical history including residual right-sided hemiparesis, hepatic steatosis, renal insufficiency, non-small cell lung cancer, lymphoma, adenocarcinoma of the parotid gland, MGUS, Barrett's esophagus, diabetes, GERD, gastroparesis, history of eosinophilic esophagitis treated with steroids, chronic right upper quadrant pain.  Recent admission in August 2023 for coffee-ground emesis, right-sided abdominal pain.  Work-up as outlined below.  Presents today with similar chronic complaints.  Always with right-sided abdominal pain.  Has had this for years.  Has been stable.  Possibly neurogenic or musculoskeletal.  Continues to struggle with constipation.  Currently taking Amitiza twice daily but patient states his stools are infrequent and hard.  He is not aware of any black or bloody stools.  He denies any heartburn, dysphagia, nausea or vomiting.  CT angio abdomen pelvis with and without contrast today showed at least mild stenosis in the left common iliac artery and left external iliac artery, large amount of stool throughout the colon particularly the rectum, indeterminate findings involving the liver, kidney, spleen.  Nonemergent abdominal MRI with and without contrast recommended.  Subtle enhancement and question mild wall thickening along the right posterior lateral bladder near the right ureterovesical junction felt to be nonspecific and consider nonemergent urological consultation.   MRI abdomen with and without contrast today showed benign lesions in both liver and kidneys  requiring no additional follow-up.  Subtle hypointense lesion in the splenic parenchyma not visible in 2019 may represent benign lesion such as splenic hamartoma or hemangioma, possibility of sclerosing angiomatosis nodular transformation of the spleen considered.  Consider 30-monthfollow-up.  Small to moderate hiatal hernia.   EGD 07/15/22: - Normal esophagus. Multiple gastric polyps. 1 large hemorrhagic polyp straddling the diaphragmatic hiatus removed with hot snare cautery; polypectomy site clipped -Markedly inflamed appearing gastric mucosa -status post biopsy. Duodenal polypoid mass, 3 x 2 cm adenomatous appearing lesion, status post biopsy -Pathology showed gastric biopsies with nonspecific reactive gastropathy, no H. pylori.  Gastric polyp benign.  Duodenal mass/lesion noted to be a precancerous polyp (duodenal adenoma with low-grade dysplasia).  Per Dr. RGala Romney "Would be very risky to remove.  Given Mr. GGalluzzooverall poor health, not really a candidate to pursue this.  I recommend we follow him conservatively".    Medications   Current Outpatient Medications  Medication Sig Dispense Refill   allopurinol (ZYLOPRIM) 300 MG tablet TAKE 1 TABLET(300 MG) BY MOUTH DAILY (Patient taking differently: Take 300 mg by mouth 2 (two) times daily.) 90 tablet 2   amLODipine (NORVASC) 10 MG tablet Take 1 tablet (10 mg total) by mouth daily. 90 tablet 2   ASPIRIN ADULT LOW STRENGTH 81 MG tablet Take 1 tablet (81 mg total) by mouth daily. Restart in 1 week 30 tablet 12   Baclofen 5 MG TABS Take 1 tablet by mouth 3 (three) times daily.     buPROPion ER (WELLBUTRIN SR) 100 MG 12 hr tablet Take 100 mg by mouth 2 (two) times daily.     cinacalcet (SENSIPAR) 30 MG tablet Take 30 mg by mouth daily with breakfast.  cloNIDine (CATAPRES) 0.2 MG tablet Take 1 tablet (0.2 mg total) by mouth 3 (three) times daily. TAKE 1 TABLET(0.2 MG) BY MOUTH THREE TIMES DAILY 270 tablet 2   cyanocobalamin (VITAMIN B12) 1000 MCG  tablet Take 1 tablet (1,000 mcg total) by mouth daily.     gabapentin (NEURONTIN) 300 MG capsule Take 2 capsules (600 mg total) by mouth 2 (two) times daily. May also take 2 capsules (600 mg total) daily as needed. (Patient taking differently: 300 MG Twice a day) 540 capsule 3   glucose blood (ONETOUCH VERIO) test strip Use as directed to check Blood Glucose twice daily 150 strip 5   insulin glargine (LANTUS SOLOSTAR) 100 UNIT/ML Solostar Pen Inject 30 Units into the skin at bedtime. (Patient taking differently: Inject 15 Units into the skin at bedtime.) 15 mL 2   iron polysaccharides (NIFEREX) 150 MG capsule TAKE 1 CAPSULE(150 MG) BY MOUTH DAILY (Patient taking differently: Take 150 mg by mouth daily.) 90 capsule 2   Lancets (ONETOUCH DELICA PLUS [id number]) MISC USE TO TEST BLOOD SUGAR FOUR TIMES DAILY 200 each 2   lisinopril (ZESTRIL) 20 MG tablet TAKE 1 TABLET(20 MG) BY MOUTH DAILY (Patient taking differently: Take 20 mg by mouth daily.) 90 tablet 2   lubiprostone (AMITIZA) 24 MCG capsule TAKE 1 CAPSULE BY MOUTH TWICE DAILY WITH MEALS (Patient taking differently: Take 24 mcg by mouth 2 (two) times daily with a meal.) 60 capsule 5   metoprolol tartrate (LOPRESSOR) 50 MG tablet Take 50 mg by mouth daily.     oxyCODONE-acetaminophen (PERCOCET) 7.5-325 MG tablet Take 1 tablet by mouth every 4 (four) hours as needed. 180 tablet 0   pantoprazole (PROTONIX) 40 MG tablet TAKE 1 TABLET(40 MG) BY MOUTH DAILY (Patient taking differently: Take 40 mg by mouth daily. TAKE 1 TABLET(40 MG) BY MOUTH DAILY) 90 tablet 2   simvastatin (ZOCOR) 20 MG tablet TAKE 1 TABLET(20 MG) BY MOUTH AT BEDTIME (Patient taking differently: Take 20 mg by mouth daily at 6 PM.) 90 tablet 2   tamsulosin (FLOMAX) 0.4 MG CAPS capsule TAKE 1 CAPSULE(0.4 MG) BY MOUTH DAILY (Patient taking differently: Take 0.4 mg by mouth daily.) 90 capsule 2   traZODone (DESYREL) 100 MG tablet Take 100 mg by mouth at bedtime.     No current  facility-administered medications for this visit.    Allergies   Allergies as of 08/24/2022   (No Known Allergies)      Review of Systems   General: Negative for anorexia, weight loss, fever, chills, fatigue.patient is bedbound  ENT: Negative for hoarseness, difficulty swallowing , nasal congestion. CV: Negative for chest pain, angina, palpitations, dyspnea on exertion, peripheral edema.  Respiratory: Negative for dyspnea at rest, dyspnea on exertion, cough, sputum, wheezing.  GI: See history of present illness. GU:  Negative for dysuria, hematuria, urinary incontinence, urinary frequency, nocturnal urination.  Endo: Negative for unusual weight change.     Physical Exam   BP 118/77 (BP Location: Left Arm, Patient Position: Sitting, Cuff Size: Large)   Pulse 73   Temp (!) 97.1 F (36.2 C) (Temporal)   Ht 6' 1"  (1.854 m)   Wt 195 lb (88.5 kg) Comment: 08/16/2022 facility reported  SpO2 96%   BMI 25.73 kg/m    General: Chronic ill-appearing male in no acute distress.  Accompanied by wife.  He presents from the facility.    Eyes: No icterus. Mouth: Oropharyngeal mucosa moist and pink , no lesions erythema or exudate. Lungs: Clear to auscultation  bilaterally.  Heart: Regular rate and rhythm, no murmurs rubs or gallops.  Abdomen: Bowel sounds are normal, nontender, nondistended, no hepatosplenomegaly or masses,  no abdominal bruits or hernia , no rebound or guarding.  Exam limited by him unable to recline adequately, requires [facility] lift for transferring.  Contracture right arm limits ability to fully examine abdomen as well. Rectal: Not performed Extremities: 1-2+ bilateral lower extremity edema. No clubbing or deformities. Neuro: Alert and oriented x 4   Skin: Warm and dry, no jaundice.   Psych: Alert and cooperative, normal mood and affect.  Labs   Lab Results  Component Value Date   CREATININE 1.44 (H) 07/17/2022   BUN 17 07/17/2022   NA 138 07/17/2022   K 3.9  07/17/2022   CL 108 07/17/2022   CO2 26 07/17/2022   Lab Results  Component Value Date   ALT 8 07/14/2022   AST 9 (L) 07/14/2022   ALKPHOS 98 07/14/2022   BILITOT 0.4 07/14/2022   Lab Results  Component Value Date   WBC 8.1 07/17/2022   HGB 8.8 (L) 07/17/2022   HCT 29.3 (L) 07/17/2022   MCV 73.8 (L) 07/17/2022   PLT 256 07/17/2022   Lab Results  Component Value Date   IRON 34 (L) 07/14/2022   TIBC 254 07/14/2022   FERRITIN 106 03/03/2021   Lab Results  Component Value Date   VITAMINB12 124 (L) 07/14/2022   Lab Results  Component Value Date   FOLATE 16.1 07/14/2022    Imaging Studies   No results found.  Assessment   Adenomatous duodenal lesion: Would be difficult to resect endoscopically, patient not felt to be a candidate for further intervention.   Anemia: Need to update labs.  Likely bled from gastric polyp during recent admission.  Gastric polyp has been removed.  Continues to have adenomatous duodenal polyp which was biopsied but not able to be removed.  We will need to follow hemoglobin and monitor for overt GI bleeding.  Chronic right upper quadrant pain: Stable.  Constipation: Poorly managed.  Continue Amitiza 24 mcg twice daily.  Will start MiraLAX 1 capful daily.  GERD: Well-controlled, continue pantoprazole 40 mg daily.   PLAN   Add MiraLAX 1 capful daily. Continue Amitiza 24 mcg twice daily. Continue pantoprazole 40 mg daily. Obtain CBC. Monitor for recurrent GI bleeding. Return to see Dr. Gala Romney in 3 months.   Laureen Ochs. Bobby Rumpf, [place], [facility]

## 2022-08-25 DIAGNOSIS — I890 Lymphedema, not elsewhere classified: Secondary | ICD-10-CM | POA: Diagnosis not present

## 2022-08-25 DIAGNOSIS — D0220 Carcinoma in situ of unspecified bronchus and lung: Secondary | ICD-10-CM | POA: Diagnosis not present

## 2022-08-25 DIAGNOSIS — R293 Abnormal posture: Secondary | ICD-10-CM | POA: Diagnosis not present

## 2022-08-25 DIAGNOSIS — I69351 Hemiplegia and hemiparesis following cerebral infarction affecting right dominant side: Secondary | ICD-10-CM | POA: Diagnosis not present

## 2022-08-25 DIAGNOSIS — M6281 Muscle weakness (generalized): Secondary | ICD-10-CM | POA: Diagnosis not present

## 2022-08-26 DIAGNOSIS — I69351 Hemiplegia and hemiparesis following cerebral infarction affecting right dominant side: Secondary | ICD-10-CM | POA: Diagnosis not present

## 2022-08-26 DIAGNOSIS — I890 Lymphedema, not elsewhere classified: Secondary | ICD-10-CM | POA: Diagnosis not present

## 2022-08-26 DIAGNOSIS — M6281 Muscle weakness (generalized): Secondary | ICD-10-CM | POA: Diagnosis not present

## 2022-08-26 DIAGNOSIS — R293 Abnormal posture: Secondary | ICD-10-CM | POA: Diagnosis not present

## 2022-08-26 DIAGNOSIS — D0220 Carcinoma in situ of unspecified bronchus and lung: Secondary | ICD-10-CM | POA: Diagnosis not present

## 2022-08-29 DIAGNOSIS — K317 Polyp of stomach and duodenum: Secondary | ICD-10-CM | POA: Diagnosis not present

## 2022-08-29 DIAGNOSIS — I890 Lymphedema, not elsewhere classified: Secondary | ICD-10-CM | POA: Diagnosis not present

## 2022-08-29 DIAGNOSIS — M6281 Muscle weakness (generalized): Secondary | ICD-10-CM | POA: Diagnosis not present

## 2022-08-29 DIAGNOSIS — I69351 Hemiplegia and hemiparesis following cerebral infarction affecting right dominant side: Secondary | ICD-10-CM | POA: Diagnosis not present

## 2022-08-29 DIAGNOSIS — R293 Abnormal posture: Secondary | ICD-10-CM | POA: Diagnosis not present

## 2022-08-29 DIAGNOSIS — D0220 Carcinoma in situ of unspecified bronchus and lung: Secondary | ICD-10-CM | POA: Diagnosis not present

## 2022-08-30 DIAGNOSIS — I890 Lymphedema, not elsewhere classified: Secondary | ICD-10-CM | POA: Diagnosis not present

## 2022-08-30 DIAGNOSIS — R293 Abnormal posture: Secondary | ICD-10-CM | POA: Diagnosis not present

## 2022-08-30 DIAGNOSIS — M6281 Muscle weakness (generalized): Secondary | ICD-10-CM | POA: Diagnosis not present

## 2022-08-30 DIAGNOSIS — D0220 Carcinoma in situ of unspecified bronchus and lung: Secondary | ICD-10-CM | POA: Diagnosis not present

## 2022-08-30 DIAGNOSIS — I69351 Hemiplegia and hemiparesis following cerebral infarction affecting right dominant side: Secondary | ICD-10-CM | POA: Diagnosis not present

## 2022-08-31 DIAGNOSIS — R293 Abnormal posture: Secondary | ICD-10-CM | POA: Diagnosis not present

## 2022-08-31 DIAGNOSIS — M6281 Muscle weakness (generalized): Secondary | ICD-10-CM | POA: Diagnosis not present

## 2022-08-31 DIAGNOSIS — D0220 Carcinoma in situ of unspecified bronchus and lung: Secondary | ICD-10-CM | POA: Diagnosis not present

## 2022-08-31 DIAGNOSIS — I69351 Hemiplegia and hemiparesis following cerebral infarction affecting right dominant side: Secondary | ICD-10-CM | POA: Diagnosis not present

## 2022-08-31 DIAGNOSIS — I890 Lymphedema, not elsewhere classified: Secondary | ICD-10-CM | POA: Diagnosis not present

## 2022-09-01 DIAGNOSIS — I890 Lymphedema, not elsewhere classified: Secondary | ICD-10-CM | POA: Diagnosis not present

## 2022-09-01 DIAGNOSIS — E119 Type 2 diabetes mellitus without complications: Secondary | ICD-10-CM | POA: Diagnosis not present

## 2022-09-01 DIAGNOSIS — D0220 Carcinoma in situ of unspecified bronchus and lung: Secondary | ICD-10-CM | POA: Diagnosis not present

## 2022-09-01 DIAGNOSIS — I10 Essential (primary) hypertension: Secondary | ICD-10-CM | POA: Diagnosis not present

## 2022-09-01 DIAGNOSIS — M6281 Muscle weakness (generalized): Secondary | ICD-10-CM | POA: Diagnosis not present

## 2022-09-01 DIAGNOSIS — R293 Abnormal posture: Secondary | ICD-10-CM | POA: Diagnosis not present

## 2022-09-01 DIAGNOSIS — N1831 Chronic kidney disease, stage 3a: Secondary | ICD-10-CM | POA: Diagnosis not present

## 2022-09-01 DIAGNOSIS — I69351 Hemiplegia and hemiparesis following cerebral infarction affecting right dominant side: Secondary | ICD-10-CM | POA: Diagnosis not present

## 2022-09-01 DIAGNOSIS — D649 Anemia, unspecified: Secondary | ICD-10-CM | POA: Diagnosis not present

## 2022-09-02 DIAGNOSIS — R293 Abnormal posture: Secondary | ICD-10-CM | POA: Diagnosis not present

## 2022-09-02 DIAGNOSIS — M6281 Muscle weakness (generalized): Secondary | ICD-10-CM | POA: Diagnosis not present

## 2022-09-02 DIAGNOSIS — D0220 Carcinoma in situ of unspecified bronchus and lung: Secondary | ICD-10-CM | POA: Diagnosis not present

## 2022-09-02 DIAGNOSIS — I69351 Hemiplegia and hemiparesis following cerebral infarction affecting right dominant side: Secondary | ICD-10-CM | POA: Diagnosis not present

## 2022-09-02 DIAGNOSIS — I890 Lymphedema, not elsewhere classified: Secondary | ICD-10-CM | POA: Diagnosis not present

## 2022-09-03 DIAGNOSIS — N390 Urinary tract infection, site not specified: Secondary | ICD-10-CM | POA: Diagnosis not present

## 2022-09-05 DIAGNOSIS — R293 Abnormal posture: Secondary | ICD-10-CM | POA: Diagnosis not present

## 2022-09-05 DIAGNOSIS — M6281 Muscle weakness (generalized): Secondary | ICD-10-CM | POA: Diagnosis not present

## 2022-09-05 DIAGNOSIS — I890 Lymphedema, not elsewhere classified: Secondary | ICD-10-CM | POA: Diagnosis not present

## 2022-09-05 DIAGNOSIS — D0220 Carcinoma in situ of unspecified bronchus and lung: Secondary | ICD-10-CM | POA: Diagnosis not present

## 2022-09-05 DIAGNOSIS — I69351 Hemiplegia and hemiparesis following cerebral infarction affecting right dominant side: Secondary | ICD-10-CM | POA: Diagnosis not present

## 2022-09-06 ENCOUNTER — Ambulatory Visit: Payer: Medicare Other | Admitting: Gastroenterology

## 2022-09-06 DIAGNOSIS — I890 Lymphedema, not elsewhere classified: Secondary | ICD-10-CM | POA: Diagnosis not present

## 2022-09-06 DIAGNOSIS — I69351 Hemiplegia and hemiparesis following cerebral infarction affecting right dominant side: Secondary | ICD-10-CM | POA: Diagnosis not present

## 2022-09-06 DIAGNOSIS — M6281 Muscle weakness (generalized): Secondary | ICD-10-CM | POA: Diagnosis not present

## 2022-09-06 DIAGNOSIS — D0220 Carcinoma in situ of unspecified bronchus and lung: Secondary | ICD-10-CM | POA: Diagnosis not present

## 2022-09-06 DIAGNOSIS — R293 Abnormal posture: Secondary | ICD-10-CM | POA: Diagnosis not present

## 2022-09-07 DIAGNOSIS — I890 Lymphedema, not elsewhere classified: Secondary | ICD-10-CM | POA: Diagnosis not present

## 2022-09-07 DIAGNOSIS — D0220 Carcinoma in situ of unspecified bronchus and lung: Secondary | ICD-10-CM | POA: Diagnosis not present

## 2022-09-07 DIAGNOSIS — R293 Abnormal posture: Secondary | ICD-10-CM | POA: Diagnosis not present

## 2022-09-07 DIAGNOSIS — I69351 Hemiplegia and hemiparesis following cerebral infarction affecting right dominant side: Secondary | ICD-10-CM | POA: Diagnosis not present

## 2022-09-07 DIAGNOSIS — M6281 Muscle weakness (generalized): Secondary | ICD-10-CM | POA: Diagnosis not present

## 2022-09-08 DIAGNOSIS — R293 Abnormal posture: Secondary | ICD-10-CM | POA: Diagnosis not present

## 2022-09-08 DIAGNOSIS — I69351 Hemiplegia and hemiparesis following cerebral infarction affecting right dominant side: Secondary | ICD-10-CM | POA: Diagnosis not present

## 2022-09-08 DIAGNOSIS — D0220 Carcinoma in situ of unspecified bronchus and lung: Secondary | ICD-10-CM | POA: Diagnosis not present

## 2022-09-08 DIAGNOSIS — I890 Lymphedema, not elsewhere classified: Secondary | ICD-10-CM | POA: Diagnosis not present

## 2022-09-08 DIAGNOSIS — M6281 Muscle weakness (generalized): Secondary | ICD-10-CM | POA: Diagnosis not present

## 2022-09-09 DIAGNOSIS — G894 Chronic pain syndrome: Secondary | ICD-10-CM | POA: Diagnosis not present

## 2022-09-10 DIAGNOSIS — I890 Lymphedema, not elsewhere classified: Secondary | ICD-10-CM | POA: Diagnosis not present

## 2022-09-10 DIAGNOSIS — M6281 Muscle weakness (generalized): Secondary | ICD-10-CM | POA: Diagnosis not present

## 2022-09-10 DIAGNOSIS — I69351 Hemiplegia and hemiparesis following cerebral infarction affecting right dominant side: Secondary | ICD-10-CM | POA: Diagnosis not present

## 2022-09-10 DIAGNOSIS — R293 Abnormal posture: Secondary | ICD-10-CM | POA: Diagnosis not present

## 2022-09-10 DIAGNOSIS — D0220 Carcinoma in situ of unspecified bronchus and lung: Secondary | ICD-10-CM | POA: Diagnosis not present

## 2022-09-12 DIAGNOSIS — D0220 Carcinoma in situ of unspecified bronchus and lung: Secondary | ICD-10-CM | POA: Diagnosis not present

## 2022-09-12 DIAGNOSIS — R293 Abnormal posture: Secondary | ICD-10-CM | POA: Diagnosis not present

## 2022-09-12 DIAGNOSIS — I69351 Hemiplegia and hemiparesis following cerebral infarction affecting right dominant side: Secondary | ICD-10-CM | POA: Diagnosis not present

## 2022-09-12 DIAGNOSIS — I890 Lymphedema, not elsewhere classified: Secondary | ICD-10-CM | POA: Diagnosis not present

## 2022-09-12 DIAGNOSIS — M6281 Muscle weakness (generalized): Secondary | ICD-10-CM | POA: Diagnosis not present

## 2022-09-13 DIAGNOSIS — I890 Lymphedema, not elsewhere classified: Secondary | ICD-10-CM | POA: Diagnosis not present

## 2022-09-13 DIAGNOSIS — M6281 Muscle weakness (generalized): Secondary | ICD-10-CM | POA: Diagnosis not present

## 2022-09-13 DIAGNOSIS — I69351 Hemiplegia and hemiparesis following cerebral infarction affecting right dominant side: Secondary | ICD-10-CM | POA: Diagnosis not present

## 2022-09-13 DIAGNOSIS — D0220 Carcinoma in situ of unspecified bronchus and lung: Secondary | ICD-10-CM | POA: Diagnosis not present

## 2022-09-13 DIAGNOSIS — R293 Abnormal posture: Secondary | ICD-10-CM | POA: Diagnosis not present

## 2022-09-14 DIAGNOSIS — R293 Abnormal posture: Secondary | ICD-10-CM | POA: Diagnosis not present

## 2022-09-14 DIAGNOSIS — D0220 Carcinoma in situ of unspecified bronchus and lung: Secondary | ICD-10-CM | POA: Diagnosis not present

## 2022-09-14 DIAGNOSIS — I69351 Hemiplegia and hemiparesis following cerebral infarction affecting right dominant side: Secondary | ICD-10-CM | POA: Diagnosis not present

## 2022-09-14 DIAGNOSIS — I890 Lymphedema, not elsewhere classified: Secondary | ICD-10-CM | POA: Diagnosis not present

## 2022-09-14 DIAGNOSIS — M6281 Muscle weakness (generalized): Secondary | ICD-10-CM | POA: Diagnosis not present

## 2022-09-15 DIAGNOSIS — D0220 Carcinoma in situ of unspecified bronchus and lung: Secondary | ICD-10-CM | POA: Diagnosis not present

## 2022-09-15 DIAGNOSIS — R293 Abnormal posture: Secondary | ICD-10-CM | POA: Diagnosis not present

## 2022-09-15 DIAGNOSIS — M6281 Muscle weakness (generalized): Secondary | ICD-10-CM | POA: Diagnosis not present

## 2022-09-15 DIAGNOSIS — I69351 Hemiplegia and hemiparesis following cerebral infarction affecting right dominant side: Secondary | ICD-10-CM | POA: Diagnosis not present

## 2022-09-15 DIAGNOSIS — I890 Lymphedema, not elsewhere classified: Secondary | ICD-10-CM | POA: Diagnosis not present

## 2022-09-16 DIAGNOSIS — D0220 Carcinoma in situ of unspecified bronchus and lung: Secondary | ICD-10-CM | POA: Diagnosis not present

## 2022-09-16 DIAGNOSIS — I69351 Hemiplegia and hemiparesis following cerebral infarction affecting right dominant side: Secondary | ICD-10-CM | POA: Diagnosis not present

## 2022-09-16 DIAGNOSIS — I890 Lymphedema, not elsewhere classified: Secondary | ICD-10-CM | POA: Diagnosis not present

## 2022-09-16 DIAGNOSIS — R293 Abnormal posture: Secondary | ICD-10-CM | POA: Diagnosis not present

## 2022-09-16 DIAGNOSIS — M6281 Muscle weakness (generalized): Secondary | ICD-10-CM | POA: Diagnosis not present

## 2022-09-16 NOTE — Telephone Encounter (Signed)
error 

## 2022-09-19 DIAGNOSIS — M6281 Muscle weakness (generalized): Secondary | ICD-10-CM | POA: Diagnosis not present

## 2022-09-19 DIAGNOSIS — D0220 Carcinoma in situ of unspecified bronchus and lung: Secondary | ICD-10-CM | POA: Diagnosis not present

## 2022-09-19 DIAGNOSIS — R293 Abnormal posture: Secondary | ICD-10-CM | POA: Diagnosis not present

## 2022-09-19 DIAGNOSIS — I69351 Hemiplegia and hemiparesis following cerebral infarction affecting right dominant side: Secondary | ICD-10-CM | POA: Diagnosis not present

## 2022-09-19 DIAGNOSIS — I890 Lymphedema, not elsewhere classified: Secondary | ICD-10-CM | POA: Diagnosis not present

## 2022-09-22 DIAGNOSIS — I69351 Hemiplegia and hemiparesis following cerebral infarction affecting right dominant side: Secondary | ICD-10-CM | POA: Diagnosis not present

## 2022-09-22 DIAGNOSIS — M6281 Muscle weakness (generalized): Secondary | ICD-10-CM | POA: Diagnosis not present

## 2022-09-22 DIAGNOSIS — R293 Abnormal posture: Secondary | ICD-10-CM | POA: Diagnosis not present

## 2022-09-22 DIAGNOSIS — D0220 Carcinoma in situ of unspecified bronchus and lung: Secondary | ICD-10-CM | POA: Diagnosis not present

## 2022-09-22 DIAGNOSIS — I890 Lymphedema, not elsewhere classified: Secondary | ICD-10-CM | POA: Diagnosis not present

## 2022-09-23 DIAGNOSIS — M6281 Muscle weakness (generalized): Secondary | ICD-10-CM | POA: Diagnosis not present

## 2022-09-23 DIAGNOSIS — D0220 Carcinoma in situ of unspecified bronchus and lung: Secondary | ICD-10-CM | POA: Diagnosis not present

## 2022-09-23 DIAGNOSIS — I890 Lymphedema, not elsewhere classified: Secondary | ICD-10-CM | POA: Diagnosis not present

## 2022-09-23 DIAGNOSIS — I69351 Hemiplegia and hemiparesis following cerebral infarction affecting right dominant side: Secondary | ICD-10-CM | POA: Diagnosis not present

## 2022-09-23 DIAGNOSIS — R293 Abnormal posture: Secondary | ICD-10-CM | POA: Diagnosis not present

## 2022-09-26 DIAGNOSIS — R293 Abnormal posture: Secondary | ICD-10-CM | POA: Diagnosis not present

## 2022-09-26 DIAGNOSIS — I890 Lymphedema, not elsewhere classified: Secondary | ICD-10-CM | POA: Diagnosis not present

## 2022-09-26 DIAGNOSIS — I69351 Hemiplegia and hemiparesis following cerebral infarction affecting right dominant side: Secondary | ICD-10-CM | POA: Diagnosis not present

## 2022-09-26 DIAGNOSIS — M6281 Muscle weakness (generalized): Secondary | ICD-10-CM | POA: Diagnosis not present

## 2022-09-26 DIAGNOSIS — D0220 Carcinoma in situ of unspecified bronchus and lung: Secondary | ICD-10-CM | POA: Diagnosis not present

## 2022-09-27 DIAGNOSIS — D472 Monoclonal gammopathy: Secondary | ICD-10-CM | POA: Diagnosis not present

## 2022-09-27 DIAGNOSIS — I69351 Hemiplegia and hemiparesis following cerebral infarction affecting right dominant side: Secondary | ICD-10-CM | POA: Diagnosis not present

## 2022-09-27 DIAGNOSIS — D631 Anemia in chronic kidney disease: Secondary | ICD-10-CM | POA: Diagnosis not present

## 2022-09-27 DIAGNOSIS — N189 Chronic kidney disease, unspecified: Secondary | ICD-10-CM | POA: Diagnosis not present

## 2022-09-27 DIAGNOSIS — I890 Lymphedema, not elsewhere classified: Secondary | ICD-10-CM | POA: Diagnosis not present

## 2022-09-27 DIAGNOSIS — I5032 Chronic diastolic (congestive) heart failure: Secondary | ICD-10-CM | POA: Diagnosis not present

## 2022-09-27 DIAGNOSIS — D0220 Carcinoma in situ of unspecified bronchus and lung: Secondary | ICD-10-CM | POA: Diagnosis not present

## 2022-09-27 DIAGNOSIS — R809 Proteinuria, unspecified: Secondary | ICD-10-CM | POA: Diagnosis not present

## 2022-09-27 DIAGNOSIS — E1122 Type 2 diabetes mellitus with diabetic chronic kidney disease: Secondary | ICD-10-CM | POA: Diagnosis not present

## 2022-09-27 DIAGNOSIS — M6281 Muscle weakness (generalized): Secondary | ICD-10-CM | POA: Diagnosis not present

## 2022-09-27 DIAGNOSIS — E1129 Type 2 diabetes mellitus with other diabetic kidney complication: Secondary | ICD-10-CM | POA: Diagnosis not present

## 2022-09-27 DIAGNOSIS — R293 Abnormal posture: Secondary | ICD-10-CM | POA: Diagnosis not present

## 2022-09-28 DIAGNOSIS — D0220 Carcinoma in situ of unspecified bronchus and lung: Secondary | ICD-10-CM | POA: Diagnosis not present

## 2022-09-28 DIAGNOSIS — M6281 Muscle weakness (generalized): Secondary | ICD-10-CM | POA: Diagnosis not present

## 2022-09-28 DIAGNOSIS — I890 Lymphedema, not elsewhere classified: Secondary | ICD-10-CM | POA: Diagnosis not present

## 2022-09-28 DIAGNOSIS — I69351 Hemiplegia and hemiparesis following cerebral infarction affecting right dominant side: Secondary | ICD-10-CM | POA: Diagnosis not present

## 2022-09-28 DIAGNOSIS — R293 Abnormal posture: Secondary | ICD-10-CM | POA: Diagnosis not present

## 2022-09-29 ENCOUNTER — Telehealth: Payer: Self-pay | Admitting: Gastroenterology

## 2022-09-29 NOTE — Telephone Encounter (Signed)
Received labs from SNF dated 09/01/22:  H/H 9.2/29.7, MCV 66.7. overall Hgb improved but MCV down. Previous labs from 07/17/22: H/H 8.8/29.3, MCV 73.8    Tammy,   Can we verify patient is on iron supplement at the nursing home.     Mandy,   At time of last ov, I requested he have ov with Rourk only in 3 months. Currently he has an appt with me at the end of the month. He does not need appt this soon. But can we please get him on Rourk's schedule.

## 2022-09-30 DIAGNOSIS — H938X2 Other specified disorders of left ear: Secondary | ICD-10-CM | POA: Diagnosis not present

## 2022-10-04 DIAGNOSIS — I890 Lymphedema, not elsewhere classified: Secondary | ICD-10-CM | POA: Diagnosis not present

## 2022-10-04 DIAGNOSIS — D0220 Carcinoma in situ of unspecified bronchus and lung: Secondary | ICD-10-CM | POA: Diagnosis not present

## 2022-10-04 DIAGNOSIS — I69351 Hemiplegia and hemiparesis following cerebral infarction affecting right dominant side: Secondary | ICD-10-CM | POA: Diagnosis not present

## 2022-10-04 DIAGNOSIS — R293 Abnormal posture: Secondary | ICD-10-CM | POA: Diagnosis not present

## 2022-10-04 DIAGNOSIS — M6281 Muscle weakness (generalized): Secondary | ICD-10-CM | POA: Diagnosis not present

## 2022-10-05 DIAGNOSIS — I890 Lymphedema, not elsewhere classified: Secondary | ICD-10-CM | POA: Diagnosis not present

## 2022-10-05 DIAGNOSIS — R293 Abnormal posture: Secondary | ICD-10-CM | POA: Diagnosis not present

## 2022-10-05 DIAGNOSIS — I69351 Hemiplegia and hemiparesis following cerebral infarction affecting right dominant side: Secondary | ICD-10-CM | POA: Diagnosis not present

## 2022-10-05 DIAGNOSIS — M6281 Muscle weakness (generalized): Secondary | ICD-10-CM | POA: Diagnosis not present

## 2022-10-05 DIAGNOSIS — D0220 Carcinoma in situ of unspecified bronchus and lung: Secondary | ICD-10-CM | POA: Diagnosis not present

## 2022-10-06 DIAGNOSIS — R293 Abnormal posture: Secondary | ICD-10-CM | POA: Diagnosis not present

## 2022-10-06 DIAGNOSIS — I69351 Hemiplegia and hemiparesis following cerebral infarction affecting right dominant side: Secondary | ICD-10-CM | POA: Diagnosis not present

## 2022-10-06 DIAGNOSIS — D0220 Carcinoma in situ of unspecified bronchus and lung: Secondary | ICD-10-CM | POA: Diagnosis not present

## 2022-10-06 DIAGNOSIS — I890 Lymphedema, not elsewhere classified: Secondary | ICD-10-CM | POA: Diagnosis not present

## 2022-10-06 DIAGNOSIS — M6281 Muscle weakness (generalized): Secondary | ICD-10-CM | POA: Diagnosis not present

## 2022-10-07 DIAGNOSIS — M6281 Muscle weakness (generalized): Secondary | ICD-10-CM | POA: Diagnosis not present

## 2022-10-07 DIAGNOSIS — R293 Abnormal posture: Secondary | ICD-10-CM | POA: Diagnosis not present

## 2022-10-07 DIAGNOSIS — I69351 Hemiplegia and hemiparesis following cerebral infarction affecting right dominant side: Secondary | ICD-10-CM | POA: Diagnosis not present

## 2022-10-07 DIAGNOSIS — D0220 Carcinoma in situ of unspecified bronchus and lung: Secondary | ICD-10-CM | POA: Diagnosis not present

## 2022-10-07 DIAGNOSIS — I890 Lymphedema, not elsewhere classified: Secondary | ICD-10-CM | POA: Diagnosis not present

## 2022-10-10 ENCOUNTER — Other Ambulatory Visit: Payer: Self-pay | Admitting: Physician Assistant

## 2022-10-10 DIAGNOSIS — D649 Anemia, unspecified: Secondary | ICD-10-CM

## 2022-10-10 DIAGNOSIS — R293 Abnormal posture: Secondary | ICD-10-CM | POA: Diagnosis not present

## 2022-10-10 DIAGNOSIS — D472 Monoclonal gammopathy: Secondary | ICD-10-CM

## 2022-10-10 DIAGNOSIS — M6281 Muscle weakness (generalized): Secondary | ICD-10-CM | POA: Diagnosis not present

## 2022-10-10 DIAGNOSIS — I890 Lymphedema, not elsewhere classified: Secondary | ICD-10-CM | POA: Diagnosis not present

## 2022-10-10 DIAGNOSIS — D0220 Carcinoma in situ of unspecified bronchus and lung: Secondary | ICD-10-CM | POA: Diagnosis not present

## 2022-10-10 DIAGNOSIS — I69351 Hemiplegia and hemiparesis following cerebral infarction affecting right dominant side: Secondary | ICD-10-CM | POA: Diagnosis not present

## 2022-10-10 NOTE — Progress Notes (Signed)
[facility] OFFICE PROGRESS NOTE  [name], DO [address] [place] [address]  DIAGNOSIS:  1) history of non-small cell lung cancer 2) MGUS  PRIOR THERAPY: SBRT under the care of Dr. Basilio Cairo in 2019  CURRENT THERAPY: 1) B12 p.o. daily  2) Iron supplement p.o. daily   INTERVAL HISTORY: [name] 76 y.o. male returns for a follow up  visit for MGUS and non-small cell lung cancer history.   He is accompanied by his wife, [name]. The patient has multiple medical problems including chronic kidney disease, diabetes, hypertension, iron deficiency anemia, GI bleed, GERD, history of stroke, hemiparesis, gout, and COPD.  He was first seen in clinic on 03/03/2021.  The patient was referred by his nephrologist Dr. Wolfgang Phoenix, who he most recently saw last week. He was being followed for MGUS.  We are following him every 6 months with repeat myeloma testing.  The patient also had a history of iron deficiency anemia.  He follows with Dr. Jena Gauss from gastroenterology in [place].  He presented to the hospital in August 2023 with coffee-ground emesis and right-sided chronic abdominal pain.  He had EGD which showed a large hemorrhagic polyp.   The patient also has a history of non-small cell lung cancer 2019 for which he received SBRT in 2019.   The patient was last seen in our clinic by Dr. Arbutus Ped on 03/30/22.  For his MGUS, his protein study showed elevated light chains but his bone marrow biopsy and aspirate showed only 5% of plasma cells.  Since last being seen the patient states that he is feeling "pretty good".  Denies any shortness of breath, cough. Denies current chest pain but states it has occurred in the past. Denies nausea, vomiting, diarrhea. Reports some chronic constipation and right sided abdominal pain. He reports the pain has occurred for years and is constant. He reports HA, denies changes in vision.   He currently lives at [facility]. His wife  accompanies him today. She is concerned he is depressed.   He denies any abnormal bleeding or bruising. From reviewing his medication list from [facility], he is taking his iron supplement daily.    He has not had any recent CT imaging.    He is here to re-establish care for his hx NSCLC and MGUS     MEDICAL HISTORY: Past Medical History:  Diagnosis Date   Arthritis    Asthma    "hx of"   Barrett's esophagus    EGD 03/23/2011 & EGD 2/09 bx proven   BPH (benign prostatic hyperplasia)    Cancer of parotid gland (HCC) 11/23/12   Adenocarcinoma   Chronic abdominal pain    Chronic constipation    Colon polyp 03/23/2011   tubular adenoma, Dr. Jena Gauss   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) 07/15/2011   CVA (cerebral infarction) 1998   right sided deficit   Delayed gastric emptying 2018   Diverticulosis    TCS 03/23/11 pancolonic diverticula &TCS 5/08, pancolonic diverticula   DM (diabetes mellitus) (HCC)    Edema of lower extremity 12/21/12   bilateral    Esotropia of left eye    GERD (gastroesophageal reflux disease)    Glaucoma (increased eye pressure)    Gout    Gout    Hemorrhagic colitis 06/06/2012.   Hemorrhoids, internal 03/23/2011   tcs by Dr. Jena Gauss   Hepatitis    esosiniphilic, tx with prednisone   Hiatal hernia  History of radiation therapy 05/21/18- 05/30/18   Left Lung/ 54 Gy delivered in 3 fractions of 18 Gy. SBRT   HTN (hypertension)    Hx of radiation therapy 1974   right base of skull area-lymphoma   Hyperlipidemia    Lower facial weakness    Right   Lymphoma (HCC) 1974   XRT at [facility], right base of skull area   Neuropathy    Non-small cell lung cancer (HCC) dx'd 04/26/18   Peripheral edema    R>L legs   Rash    chronic, recurrent, R>L legs   Renal insufficiency    Steatohepatitis    liver biopsy 2009   Stroke [facility]) 1998   right hemiparesis/plegia    ALLERGIES:  has No Known Allergies.  MEDICATIONS:  Current Outpatient  Medications  Medication Sig Dispense Refill   allopurinol (ZYLOPRIM) 300 MG tablet TAKE 1 TABLET(300 MG) BY MOUTH DAILY (Patient taking differently: Take 300 mg by mouth 2 (two) times daily.) 90 tablet 2   amLODipine (NORVASC) 10 MG tablet Take 1 tablet (10 mg total) by mouth daily. 90 tablet 2   ASPIRIN ADULT LOW STRENGTH 81 MG tablet Take 1 tablet (81 mg total) by mouth daily. Restart in 1 week 30 tablet 12   Baclofen 5 MG TABS Take 1 tablet by mouth 3 (three) times daily.     buPROPion ER (WELLBUTRIN SR) 100 MG 12 hr tablet Take 100 mg by mouth 2 (two) times daily.     cinacalcet (SENSIPAR) 30 MG tablet Take 30 mg by mouth daily with breakfast.     cloNIDine (CATAPRES) 0.2 MG tablet Take 1 tablet (0.2 mg total) by mouth 3 (three) times daily. TAKE 1 TABLET(0.2 MG) BY MOUTH THREE TIMES DAILY 270 tablet 2   cyanocobalamin (VITAMIN B12) 1000 MCG tablet Take 1 tablet (1,000 mcg total) by mouth daily.     gabapentin (NEURONTIN) 300 MG capsule Take 2 capsules (600 mg total) by mouth 2 (two) times daily. May also take 2 capsules (600 mg total) daily as needed. (Patient taking differently: 300 MG Twice a day) 540 capsule 3   glucose blood (ONETOUCH VERIO) test strip Use as directed to check Blood Glucose twice daily 150 strip 5   insulin glargine (LANTUS SOLOSTAR) 100 UNIT/ML Solostar Pen Inject 30 Units into the skin at bedtime. (Patient taking differently: Inject 15 Units into the skin at bedtime.) 15 mL 2   iron polysaccharides (NIFEREX) 150 MG capsule TAKE 1 CAPSULE(150 MG) BY MOUTH DAILY (Patient taking differently: Take 150 mg by mouth daily.) 90 capsule 2   Lancets (ONETOUCH DELICA PLUS LANCET33G) MISC USE TO TEST BLOOD SUGAR FOUR TIMES DAILY 200 each 2   lisinopril (ZESTRIL) 20 MG tablet TAKE 1 TABLET(20 MG) BY MOUTH DAILY (Patient taking differently: Take 20 mg by mouth daily.) 90 tablet 2   lubiprostone (AMITIZA) 24 MCG capsule TAKE 1 CAPSULE BY MOUTH TWICE DAILY WITH MEALS (Patient taking  differently: Take 24 mcg by mouth 2 (two) times daily with a meal.) 60 capsule 5   metoprolol tartrate (LOPRESSOR) 50 MG tablet Take 50 mg by mouth daily.     oxyCODONE-acetaminophen (PERCOCET) 7.5-325 MG tablet Take 1 tablet by mouth every 4 (four) hours as needed. 180 tablet 0   pantoprazole (PROTONIX) 40 MG tablet TAKE 1 TABLET(40 MG) BY MOUTH DAILY (Patient taking differently: Take 40 mg by mouth daily. TAKE 1 TABLET(40 MG) BY MOUTH DAILY) 90 tablet 2   polyethylene glycol powder (MIRALAX) 17 GM/SCOOP  powder Take one capful daily. Hold for diarrhea. 527 g 5   simvastatin (ZOCOR) 20 MG tablet TAKE 1 TABLET(20 MG) BY MOUTH AT BEDTIME (Patient taking differently: Take 20 mg by mouth daily at 6 PM.) 90 tablet 2   tamsulosin (FLOMAX) 0.4 MG CAPS capsule TAKE 1 CAPSULE(0.4 MG) BY MOUTH DAILY (Patient taking differently: Take 0.4 mg by mouth daily.) 90 capsule 2   traZODone (DESYREL) 100 MG tablet Take 100 mg by mouth at bedtime.     No current facility-administered medications for this visit.    SURGICAL HISTORY:  Past Surgical History:  Procedure Laterality Date   BIOPSY  12/01/2016   Procedure: BIOPSY;  Surgeon: Corbin Ade, MD;  Location: [facility];  Service: Endoscopy;;  duodenum, gastric, esophagus   BIOPSY  07/15/2022   Procedure: BIOPSY;  Surgeon: Corbin Ade, MD;  Location: [facility];  Service: Endoscopy;;  duodenal, mass;   CHOLECYSTECTOMY     COLONOSCOPY  03/23/11   Dr. Jena Gauss  pancolonic diverticula, hemorrhoids, tubular adenoma.. next tcs 03/2016   COLONOSCOPY WITH PROPOFOL N/A 12/01/2016   inadequate bowel prep precluded exam   ESOPHAGOGASTRODUODENOSCOPY  02/05/08   goblet cell metaplasia/negative for H.pylori   ESOPHAGOGASTRODUODENOSCOPY  03/23/11   Dr. Jena Gauss, barretts, hiatal hernia   ESOPHAGOGASTRODUODENOSCOPY (EGD) WITH PROPOFOL N/A 12/01/2016   Dr. Jena Gauss: Large amount of retained gastric contents precluded completion of the stomach. Mucosal changes were found in  the stomach. Erosions and somewhat scalloped appearing mucosa present, reactive gastritis/no H pylori. Barrett's esophagus noted, no dysplasia on biopsy. Duodenal biopsies taken as well, benign, no evidence of eosinophilia.   ESOPHAGOGASTRODUODENOSCOPY (EGD) WITH PROPOFOL N/A 07/15/2022   Procedure: ESOPHAGOGASTRODUODENOSCOPY (EGD) WITH PROPOFOL;  Surgeon: Corbin Ade, MD;  [facility]: [facility];  Service: Endoscopy;  Laterality: N/A;   IR FLUORO GUIDED NEEDLE PLC ASPIRATION/INJECTION LOC  12/13/2018   IR KYPHO LUMBAR INC FX REDUCE BONE BX UNI/BIL CANNULATION INC/IMAGING  12/13/2018   IR RADIOLOGY PERIPHERAL GUIDED IV START  12/13/2018   IR US GUIDE VASC ACCESS LEFT  12/13/2018   MASS BIOPSY  11/01/2012   Procedure: NECK MASS BIOPSY;  Surgeon: Darletta Moll, MD;  Location: [facility];  Service: ENT;  Laterality: Right;  Excisional Bx Right Neck Mass; attempted external jugular cutdown of left side   PAROTIDECTOMY  11/24/2012   Procedure: PAROTIDECTOMY;  Surgeon: Darletta Moll, MD;  Location: [facility] OR;  Service: ENT;  Laterality: N/A;  Total parotidectomy   PLEURECTOMY     right lymph node removal Right    behind right ear   Right video-assisted thoracic surgery, pleurectomy, and pleurodesis  2011   VIDEO BRONCHOSCOPY WITH ENDOBRONCHIAL NAVIGATION N/A 04/26/2018   Procedure: VIDEO BRONCHOSCOPY WITH ENDOBRONCHIAL NAVIGATION;  Surgeon: Loreli Slot, MD;  Location: [facility] OR;  Service: Thoracic;  Laterality: N/A;    REVIEW OF SYSTEMS:   Review of Systems  Constitutional: Negative for appetite change, chills, fatigue, fever and unexpected weight change.  HENT: Positive for left facial droop. Negative for mouth sores, nosebleeds, sore throat and trouble swallowing.   Eyes: Negative for eye problems and icterus.  Respiratory: Negative for cough, hemoptysis, shortness of breath and wheezing.   Cardiovascular: Negative for chest pain and leg swelling.  Gastrointestinal: Positive for frequent constipation and  right sided abdominal pain. Negative for diarrhea, nausea, and vomiting.  Genitourinary: Negative for bladder incontinence, difficulty urinating, dysuria, frequency and hematuria.   Musculoskeletal: Positive for hemiparesis. Negative for back pain, gait  problem, neck pain and neck stiffness.  Skin: Negative for itching and rash.  Neurological: Negative for dizziness, extremity weakness, gait problem, headaches, light-headedness and seizures.  Hematological: Negative for adenopathy. Does not bruise/bleed easily.  Psychiatric/Behavioral: Negative for confusion, depression and sleep disturbance. The patient is not nervous/anxious.     PHYSICAL EXAMINATION:  There were no vitals taken for this visit.  ECOG PERFORMANCE STATUS: 3  Physical Exam  Constitutional: Oriented to person, place, and time and chronically ill appearing male and in no distress.  HENT:  Head: Normocephalic and atraumatic.  Mouth/Throat: Oropharynx is clear and moist. No oropharyngeal exudate.  Eyes: Conjunctivae are normal. Right eye exhibits no discharge. Left eye exhibits no discharge. No scleral icterus.  Neck: Normal range of motion. Neck supple.  Cardiovascular: Normal rate, regular rhythm, normal heart sounds and intact distal pulses.   Pulmonary/Chest: Effort normal and breath sounds normal. No respiratory distress. No wheezes. No rales.  Abdominal: Soft. Bowel sounds are normal. Exhibits no distension and no mass. There is no tenderness.  Musculoskeletal: Exhibits no edema. In wheelchair  Lymphadenopathy:    No cervical adenopathy.  Neurological: Alert and oriented to person, place, and time. Left facial droop. In wheel chair  Skin: Skin is warm and dry. No rash noted. Not diaphoretic. No erythema. No pallor.  Psychiatric: Mood, memory and judgment normal.  Vitals reviewed.  LABORATORY DATA: Lab Results  Component Value Date   WBC 8.1 07/17/2022   HGB 8.8 (L) 07/17/2022   HCT 29.3 (L) 07/17/2022   MCV  73.8 (L) 07/17/2022   PLT 256 07/17/2022      Chemistry      Component Value Date/Time   NA 138 07/17/2022 0400   NA 143 12/16/2020 0918   K 3.9 07/17/2022 0400   K 4.3 08/16/2011 0957   CL 108 07/17/2022 0400   CO2 26 07/17/2022 0400   BUN 17 07/17/2022 0400   BUN 28 (H) 12/16/2020 0918   BUN 23 08/16/2011 0957   CREATININE 1.44 (H) 07/17/2022 0400   CREATININE 1.78 (H) 09/29/2021 1500   CREATININE 1.69 (H) 09/21/2021 1242      Component Value Date/Time   CALCIUM 8.3 (L) 07/17/2022 0400   CALCIUM 10.3 08/16/2011 0957   CALCIUM 11.4 (H) 08/12/2011 1349   ALKPHOS 98 07/14/2022 0904   ALKPHOS 80 08/16/2011 0957   AST 9 (L) 07/14/2022 0904   AST 13 (L) 09/29/2021 1500   ALT 8 07/14/2022 0904   ALT 8 09/29/2021 1500   BILITOT 0.4 07/14/2022 0904   BILITOT 0.2 (L) 09/29/2021 1500       RADIOGRAPHIC STUDIES:  No results found.   ASSESSMENT/PLAN:  This is a very pleasant 76 years old African-American male with history of 1) stage Ib non-small cell lung cancer status post SBRT under the care of Dr. Basilio Cairo in 2019. 2) MGUS.   The patient was last seen on 03/30/2021.  The patient was lost to follow-up since that time.  Letter was sent in the mail to his house.  Regarding the MGUS, the patient previously had a bone marrow biopsy and aspirate which showed only 5% plasma cells in 2022.   The patient was seen with Dr. Arbutus Ped today.  Labs were reviewed.  We will arrange for the patient to have a restaging CT scan of the chest without contrast due to his renal insufficiency.  I will also arrange for repeat myeloma labs.  We will see the patient in about 2-3 weeks to discuss  labs and scans results. If his CT of the chest is stable, we will repeat this annually.    Regarding the anemia, we will repeat his iron studies to ensure does not need an iron infusion. Confirmed he is taking his iron supplement. He will continue to follow with Dr. Jena Gauss from [facility]  gastroenterology.  He will continue to follow with his nephrologist for his chronic kidney disease. He last saw him last week.   He will talk to the PCP at [facility] about his depressed mood.   The patient was advised to call immediately if she has any concerning symptoms in the interval. The patient voices understanding of current disease status and treatment options and is in agreement with the current care plan. All questions were answered. The patient knows to call the clinic with any problems, questions or concerns. We can certainly see the patient much sooner if necessary     No orders of the defined types were placed in this encounter.    Campbell Agramonte L Roux Brandy, PA-C 10/10/22  ADDENDUM: Hematology/Oncology Attending: I had a face-to-face encounter with the patient today.  I reviewed his record, lab and recommended his care plan.  This is a very pleasant 75 years old African-American male with history of stage Ib non-small cell lung cancer status post SBRT in 2019. The patient also has a history of MGUS. He was followed by observation but unfortunately was lost to follow-up since April 2022. He is currently on skilled nursing facility and he was referred back to [place] today for evaluation and recommendation regarding his condition. I had a lengthy discussion with the patient and his wife about his condition. I recommended for him to have repeat CT scan of the chest without contrast and few weeks for evaluation of his lung cancer.  We will also repeat his myeloma panel for evaluation of the MGUS. For the recent anemia and iron deficiency, he is followed by gastroenterology in [place]. The patient will come back for follow-up visit in 2-3 weeks for discussion of his scan and lab results and further recommendation regarding his condition. He was advised to call immediately if he has any other concerning issues in the interval. The total time spent in the appointment was 30  minutes. Disclaimer: This note was dictated with voice recognition software. Similar sounding words can inadvertently be transcribed and may be missed upon review.  Lajuana Matte, MD

## 2022-10-11 ENCOUNTER — Other Ambulatory Visit: Payer: Self-pay

## 2022-10-11 ENCOUNTER — Ambulatory Visit: Payer: Medicare Other | Admitting: Gastroenterology

## 2022-10-11 ENCOUNTER — Inpatient Hospital Stay: Payer: Medicare Other | Attending: Physician Assistant | Admitting: Physician Assistant

## 2022-10-11 ENCOUNTER — Inpatient Hospital Stay: Payer: Medicare Other

## 2022-10-11 VITALS — BP 134/90 | HR 69 | Temp 98.5°F | Resp 14

## 2022-10-11 DIAGNOSIS — D472 Monoclonal gammopathy: Secondary | ICD-10-CM

## 2022-10-11 DIAGNOSIS — D509 Iron deficiency anemia, unspecified: Secondary | ICD-10-CM | POA: Diagnosis not present

## 2022-10-11 DIAGNOSIS — Z85118 Personal history of other malignant neoplasm of bronchus and lung: Secondary | ICD-10-CM | POA: Insufficient documentation

## 2022-10-11 DIAGNOSIS — Z923 Personal history of irradiation: Secondary | ICD-10-CM | POA: Insufficient documentation

## 2022-10-11 DIAGNOSIS — D649 Anemia, unspecified: Secondary | ICD-10-CM | POA: Insufficient documentation

## 2022-10-11 DIAGNOSIS — Z794 Long term (current) use of insulin: Secondary | ICD-10-CM | POA: Diagnosis not present

## 2022-10-11 DIAGNOSIS — N189 Chronic kidney disease, unspecified: Secondary | ICD-10-CM | POA: Insufficient documentation

## 2022-10-11 DIAGNOSIS — I129 Hypertensive chronic kidney disease with stage 1 through stage 4 chronic kidney disease, or unspecified chronic kidney disease: Secondary | ICD-10-CM | POA: Insufficient documentation

## 2022-10-11 DIAGNOSIS — Z79899 Other long term (current) drug therapy: Secondary | ICD-10-CM | POA: Insufficient documentation

## 2022-10-11 DIAGNOSIS — E1122 Type 2 diabetes mellitus with diabetic chronic kidney disease: Secondary | ICD-10-CM | POA: Diagnosis not present

## 2022-10-11 DIAGNOSIS — C3412 Malignant neoplasm of upper lobe, left bronchus or lung: Secondary | ICD-10-CM | POA: Diagnosis not present

## 2022-10-11 DIAGNOSIS — F32A Depression, unspecified: Secondary | ICD-10-CM | POA: Diagnosis not present

## 2022-10-11 LAB — IRON AND IRON BINDING CAPACITY (CC-WL,HP ONLY)
Iron: 41 ug/dL — ABNORMAL LOW (ref 45–182)
Saturation Ratios: 14 % — ABNORMAL LOW (ref 17.9–39.5)
TIBC: 284 ug/dL (ref 250–450)
UIBC: 243 ug/dL (ref 117–376)

## 2022-10-11 LAB — CBC WITH DIFFERENTIAL (CANCER CENTER ONLY)
Abs Immature Granulocytes: 0.05 10*3/uL (ref 0.00–0.07)
Basophils Absolute: 0.1 10*3/uL (ref 0.0–0.1)
Basophils Relative: 1 %
Eosinophils Absolute: 0.5 10*3/uL (ref 0.0–0.5)
Eosinophils Relative: 6 %
HCT: 33.6 % — ABNORMAL LOW (ref 39.0–52.0)
Hemoglobin: 10.2 g/dL — ABNORMAL LOW (ref 13.0–17.0)
Immature Granulocytes: 1 %
Lymphocytes Relative: 13 %
Lymphs Abs: 1.2 10*3/uL (ref 0.7–4.0)
MCH: 20 pg — ABNORMAL LOW (ref 26.0–34.0)
MCHC: 30.4 g/dL (ref 30.0–36.0)
MCV: 65.8 fL — ABNORMAL LOW (ref 80.0–100.0)
Monocytes Absolute: 0.6 10*3/uL (ref 0.1–1.0)
Monocytes Relative: 7 %
Neutro Abs: 7 10*3/uL (ref 1.7–7.7)
Neutrophils Relative %: 72 %
Platelet Count: 336 10*3/uL (ref 150–400)
RBC: 5.11 MIL/uL (ref 4.22–5.81)
RDW: 22 % — ABNORMAL HIGH (ref 11.5–15.5)
WBC Count: 9.5 10*3/uL (ref 4.0–10.5)
nRBC: 0 % (ref 0.0–0.2)

## 2022-10-11 LAB — CMP (CANCER CENTER ONLY)
ALT: 10 U/L (ref 0–44)
AST: 8 U/L — ABNORMAL LOW (ref 15–41)
Albumin: 3.7 g/dL (ref 3.5–5.0)
Alkaline Phosphatase: 103 U/L (ref 38–126)
Anion gap: 5 (ref 5–15)
BUN: 23 mg/dL (ref 8–23)
CO2: 29 mmol/L (ref 22–32)
Calcium: 9.1 mg/dL (ref 8.9–10.3)
Chloride: 104 mmol/L (ref 98–111)
Creatinine: 1.43 mg/dL — ABNORMAL HIGH (ref 0.61–1.24)
GFR, Estimated: 51 mL/min — ABNORMAL LOW (ref >60)
Glucose, Bld: 127 mg/dL — ABNORMAL HIGH (ref 70–99)
Potassium: 4 mmol/L (ref 3.5–5.1)
Sodium: 138 mmol/L (ref 135–145)
Total Bilirubin: 0.3 mg/dL (ref 0.3–1.2)
Total Protein: 7.7 g/dL (ref 6.5–8.1)

## 2022-10-11 LAB — FERRITIN: Ferritin: 23 ng/mL — ABNORMAL LOW (ref 24–336)

## 2022-10-11 LAB — SAMPLE TO BLOOD BANK

## 2022-10-11 LAB — LACTATE DEHYDROGENASE: LDH: 110 U/L (ref 98–192)

## 2022-10-12 DIAGNOSIS — I69351 Hemiplegia and hemiparesis following cerebral infarction affecting right dominant side: Secondary | ICD-10-CM | POA: Diagnosis not present

## 2022-10-12 LAB — KAPPA/LAMBDA LIGHT CHAINS
Kappa free light chain: 185.6 mg/L — ABNORMAL HIGH (ref 3.3–19.4)
Kappa, lambda light chain ratio: 3.33 — ABNORMAL HIGH (ref 0.26–1.65)
Lambda free light chains: 55.7 mg/L — ABNORMAL HIGH (ref 5.7–26.3)

## 2022-10-12 LAB — BETA 2 MICROGLOBULIN, SERUM: Beta-2 Microglobulin: 4.5 mg/L — ABNORMAL HIGH (ref 0.6–2.4)

## 2022-10-12 LAB — IGG, IGA, IGM
IgA: 324 mg/dL (ref 61–437)
IgG (Immunoglobin G), Serum: 1517 mg/dL (ref 603–1613)
IgM (Immunoglobulin M), Srm: 57 mg/dL (ref 15–143)

## 2022-10-14 DIAGNOSIS — I69351 Hemiplegia and hemiparesis following cerebral infarction affecting right dominant side: Secondary | ICD-10-CM | POA: Diagnosis not present

## 2022-10-17 DIAGNOSIS — I69351 Hemiplegia and hemiparesis following cerebral infarction affecting right dominant side: Secondary | ICD-10-CM | POA: Diagnosis not present

## 2022-10-18 DIAGNOSIS — I69351 Hemiplegia and hemiparesis following cerebral infarction affecting right dominant side: Secondary | ICD-10-CM | POA: Diagnosis not present

## 2022-10-19 DIAGNOSIS — I69351 Hemiplegia and hemiparesis following cerebral infarction affecting right dominant side: Secondary | ICD-10-CM | POA: Diagnosis not present

## 2022-10-20 ENCOUNTER — Ambulatory Visit (HOSPITAL_COMMUNITY)
Admission: RE | Admit: 2022-10-20 | Discharge: 2022-10-20 | Disposition: A | Discharge status: Home / Self Care | Payer: Medicare Other | Source: Ambulatory Visit | Attending: Physician Assistant | Admitting: Physician Assistant

## 2022-10-20 DIAGNOSIS — C3490 Malignant neoplasm of unspecified part of unspecified bronchus or lung: Secondary | ICD-10-CM | POA: Diagnosis not present

## 2022-10-20 DIAGNOSIS — I69351 Hemiplegia and hemiparesis following cerebral infarction affecting right dominant side: Secondary | ICD-10-CM | POA: Diagnosis not present

## 2022-10-20 DIAGNOSIS — C3412 Malignant neoplasm of upper lobe, left bronchus or lung: Secondary | ICD-10-CM

## 2022-10-20 DIAGNOSIS — J439 Emphysema, unspecified: Secondary | ICD-10-CM | POA: Diagnosis not present

## 2022-10-20 DIAGNOSIS — R918 Other nonspecific abnormal finding of lung field: Secondary | ICD-10-CM | POA: Diagnosis not present

## 2022-10-20 DIAGNOSIS — J479 Bronchiectasis, uncomplicated: Secondary | ICD-10-CM | POA: Diagnosis not present

## 2022-10-21 DIAGNOSIS — I69351 Hemiplegia and hemiparesis following cerebral infarction affecting right dominant side: Secondary | ICD-10-CM | POA: Diagnosis not present

## 2022-10-25 DIAGNOSIS — I69351 Hemiplegia and hemiparesis following cerebral infarction affecting right dominant side: Secondary | ICD-10-CM | POA: Diagnosis not present

## 2022-10-27 DIAGNOSIS — I69351 Hemiplegia and hemiparesis following cerebral infarction affecting right dominant side: Secondary | ICD-10-CM | POA: Diagnosis not present

## 2022-10-28 DIAGNOSIS — I69351 Hemiplegia and hemiparesis following cerebral infarction affecting right dominant side: Secondary | ICD-10-CM | POA: Diagnosis not present

## 2022-10-29 ENCOUNTER — Telehealth: Payer: Self-pay | Admitting: Internal Medicine

## 2022-10-29 NOTE — Telephone Encounter (Signed)
Called patient regarding upcoming November appointment, spoke with patient's spouse. Patient will be notified.

## 2022-10-30 DIAGNOSIS — I69351 Hemiplegia and hemiparesis following cerebral infarction affecting right dominant side: Secondary | ICD-10-CM | POA: Diagnosis not present

## 2022-10-31 DIAGNOSIS — I69351 Hemiplegia and hemiparesis following cerebral infarction affecting right dominant side: Secondary | ICD-10-CM | POA: Diagnosis not present

## 2022-11-01 ENCOUNTER — Inpatient Hospital Stay: Payer: Medicare Other | Attending: Physician Assistant

## 2022-11-01 ENCOUNTER — Inpatient Hospital Stay (HOSPITAL_BASED_OUTPATIENT_CLINIC_OR_DEPARTMENT_OTHER): Payer: Medicare Other | Admitting: Internal Medicine

## 2022-11-01 ENCOUNTER — Encounter: Payer: Self-pay | Admitting: Internal Medicine

## 2022-11-01 ENCOUNTER — Other Ambulatory Visit: Payer: Self-pay | Admitting: Medical Oncology

## 2022-11-01 ENCOUNTER — Other Ambulatory Visit: Payer: Self-pay

## 2022-11-01 VITALS — BP 130/78 | HR 69 | Temp 98.2°F | Resp 14 | Ht 73.0 in | Wt 189.0 lb

## 2022-11-01 DIAGNOSIS — I69351 Hemiplegia and hemiparesis following cerebral infarction affecting right dominant side: Secondary | ICD-10-CM | POA: Diagnosis not present

## 2022-11-01 DIAGNOSIS — R5383 Other fatigue: Secondary | ICD-10-CM | POA: Diagnosis not present

## 2022-11-01 DIAGNOSIS — Z923 Personal history of irradiation: Secondary | ICD-10-CM | POA: Insufficient documentation

## 2022-11-01 DIAGNOSIS — D472 Monoclonal gammopathy: Secondary | ICD-10-CM

## 2022-11-01 DIAGNOSIS — C3412 Malignant neoplasm of upper lobe, left bronchus or lung: Secondary | ICD-10-CM

## 2022-11-01 DIAGNOSIS — Z8673 Personal history of transient ischemic attack (TIA), and cerebral infarction without residual deficits: Secondary | ICD-10-CM | POA: Diagnosis not present

## 2022-11-01 DIAGNOSIS — C3490 Malignant neoplasm of unspecified part of unspecified bronchus or lung: Secondary | ICD-10-CM | POA: Diagnosis not present

## 2022-11-01 DIAGNOSIS — Z8719 Personal history of other diseases of the digestive system: Secondary | ICD-10-CM | POA: Insufficient documentation

## 2022-11-01 DIAGNOSIS — D509 Iron deficiency anemia, unspecified: Secondary | ICD-10-CM

## 2022-11-01 DIAGNOSIS — Z85118 Personal history of other malignant neoplasm of bronchus and lung: Secondary | ICD-10-CM | POA: Insufficient documentation

## 2022-11-01 DIAGNOSIS — E041 Nontoxic single thyroid nodule: Secondary | ICD-10-CM | POA: Diagnosis not present

## 2022-11-01 DIAGNOSIS — Z79899 Other long term (current) drug therapy: Secondary | ICD-10-CM | POA: Diagnosis not present

## 2022-11-01 LAB — CBC WITH DIFFERENTIAL (CANCER CENTER ONLY)
Abs Immature Granulocytes: 0.04 10*3/uL (ref 0.00–0.07)
Basophils Absolute: 0.1 10*3/uL (ref 0.0–0.1)
Basophils Relative: 1 %
Eosinophils Absolute: 0.4 10*3/uL (ref 0.0–0.5)
Eosinophils Relative: 6 %
HCT: 31.7 % — ABNORMAL LOW (ref 39.0–52.0)
Hemoglobin: 9.6 g/dL — ABNORMAL LOW (ref 13.0–17.0)
Immature Granulocytes: 1 %
Lymphocytes Relative: 17 %
Lymphs Abs: 1.2 10*3/uL (ref 0.7–4.0)
MCH: 19.9 pg — ABNORMAL LOW (ref 26.0–34.0)
MCHC: 30.3 g/dL (ref 30.0–36.0)
MCV: 65.8 fL — ABNORMAL LOW (ref 80.0–100.0)
Monocytes Absolute: 0.6 10*3/uL (ref 0.1–1.0)
Monocytes Relative: 9 %
Neutro Abs: 5 10*3/uL (ref 1.7–7.7)
Neutrophils Relative %: 66 %
Platelet Count: 275 10*3/uL (ref 150–400)
RBC: 4.82 MIL/uL (ref 4.22–5.81)
RDW: 23.1 % — ABNORMAL HIGH (ref 11.5–15.5)
WBC Count: 7.4 10*3/uL (ref 4.0–10.5)
nRBC: 0 % (ref 0.0–0.2)

## 2022-11-01 LAB — CMP (CANCER CENTER ONLY)
ALT: 8 U/L (ref 0–44)
AST: 8 U/L — ABNORMAL LOW (ref 15–41)
Albumin: 3.6 g/dL (ref 3.5–5.0)
Alkaline Phosphatase: 100 U/L (ref 38–126)
Anion gap: 4 — ABNORMAL LOW (ref 5–15)
BUN: 28 mg/dL — ABNORMAL HIGH (ref 8–23)
CO2: 29 mmol/L (ref 22–32)
Calcium: 9.3 mg/dL (ref 8.9–10.3)
Chloride: 103 mmol/L (ref 98–111)
Creatinine: 1.47 mg/dL — ABNORMAL HIGH (ref 0.61–1.24)
GFR, Estimated: 49 mL/min — ABNORMAL LOW (ref >60)
Glucose, Bld: 124 mg/dL — ABNORMAL HIGH (ref 70–99)
Potassium: 4.3 mmol/L (ref 3.5–5.1)
Sodium: 136 mmol/L (ref 135–145)
Total Bilirubin: 0.3 mg/dL (ref 0.3–1.2)
Total Protein: 7.2 g/dL (ref 6.5–8.1)

## 2022-11-01 NOTE — Progress Notes (Signed)
[place] Telephone:([phone]) [phone]   [phone]) [phone]  OFFICE PROGRESS NOTE  Hal Morales, DO [place] [address]  DIAGNOSIS:  1) history of non-small cell lung cancer 2) MGUS  PRIOR THERAPY: None  CURRENT THERAPY: Observation  INTERVAL HISTORY: [name] 76 y.o. male returns to the clinic today for follow-up visit accompanied by his wife.  The patient continues to have the baseline fatigue and weakness.  He denied having any current chest pain, shortness of breath, cough or hemoptysis.  He has no nausea, vomiting, diarrhea or constipation.  He has no headache or visual changes.  He had repeat CT scan of the chest as well as myeloma panel performed recently and he is here for evaluation and discussion of his lab and scan results.[name]  MEDICAL HISTORY: Past Medical History:  Diagnosis Date   Arthritis    Asthma    "hx of"   Barrett's esophagus    EGD 03/23/2011 & EGD 2/09 bx proven   BPH (benign prostatic hyperplasia)    Cancer of parotid gland ([place]) 11/23/12   Adenocarcinoma   Chronic abdominal pain    Chronic constipation    Colon polyp 03/23/2011   tubular adenoma, Dr. Gala Romney   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 ([place]) 07/15/2011   CVA (cerebral infarction) 1998   right sided deficit   Delayed gastric emptying 2018   Diverticulosis    TCS 03/23/11 pancolonic diverticula &TCS 5/08, pancolonic diverticula   DM (diabetes mellitus) ([place])    Edema of lower extremity 12/21/12   bilateral    Esotropia of left eye    GERD (gastroesophageal reflux disease)    Glaucoma (increased eye pressure)    Gout    Gout    Hemorrhagic colitis 06/06/2012.   Hemorrhoids, internal 03/23/2011   tcs by Dr. Gala Romney   Hepatitis    esosiniphilic, tx with prednisone   Hiatal hernia    History of radiation therapy 05/21/18- 05/30/18   Left Lung/ 54 Gy delivered in 3 fractions of 18 Gy. SBRT   HTN (hypertension)    Hx of radiation  therapy 1974   right base of skull area-lymphoma   Hyperlipidemia    Lower facial weakness    Right   Lymphoma ([place]) 1974   XRT at [facility], right base of skull area   Neuropathy    Non-small cell lung cancer ([place]) dx'd 04/26/18   Peripheral edema    R>L legs   Rash    chronic, recurrent, R>L legs   Renal insufficiency    Steatohepatitis    liver biopsy 2009   Stroke [facility]) 1998   right hemiparesis/plegia    ALLERGIES:  has No Known Allergies.  MEDICATIONS:  Current Outpatient Medications  Medication Sig Dispense Refill   allopurinol (ZYLOPRIM) 300 MG tablet TAKE 1 TABLET(300 MG) BY MOUTH DAILY (Patient taking differently: Take 300 mg by mouth 2 (two) times daily.) 90 tablet 2   amLODipine (NORVASC) 10 MG tablet Take 1 tablet (10 mg total) by mouth daily. 90 tablet 2   ASPIRIN ADULT LOW STRENGTH 81 MG tablet Take 1 tablet (81 mg total) by mouth daily. Restart in 1 week 30 tablet 12   Baclofen 5 MG TABS Take 1 tablet by mouth 3 (three) times daily.     buPROPion ER (WELLBUTRIN SR) 100 MG 12 hr tablet Take 100 mg by mouth 2 (two) times daily.     cinacalcet (SENSIPAR)  30 MG tablet Take 30 mg by mouth daily with breakfast.     cloNIDine (CATAPRES) 0.2 MG tablet Take 1 tablet (0.2 mg total) by mouth 3 (three) times daily. TAKE 1 TABLET(0.2 MG) BY MOUTH THREE TIMES DAILY 270 tablet 2   cyanocobalamin (VITAMIN B12) 1000 MCG tablet Take 1 tablet (1,000 mcg total) by mouth daily.     gabapentin (NEURONTIN) 300 MG capsule Take 2 capsules (600 mg total) by mouth 2 (two) times daily. May also take 2 capsules (600 mg total) daily as needed. (Patient taking differently: 300 MG Twice a day) 540 capsule 3   glucose blood (ONETOUCH VERIO) test strip Use as directed to check Blood Glucose twice daily 150 strip 5   insulin glargine (LANTUS SOLOSTAR) 100 UNIT/ML Solostar Pen Inject 30 Units into the skin at bedtime. (Patient taking differently: Inject 15 Units into the skin at bedtime.) 15 mL 2   iron  polysaccharides (NIFEREX) 150 MG capsule TAKE 1 CAPSULE(150 MG) BY MOUTH DAILY (Patient taking differently: Take 150 mg by mouth daily.) 90 capsule 2   Lancets (ONETOUCH DELICA PLUS [id number]) MISC USE TO TEST BLOOD SUGAR FOUR TIMES DAILY 200 each 2   lisinopril (ZESTRIL) 20 MG tablet TAKE 1 TABLET(20 MG) BY MOUTH DAILY (Patient taking differently: Take 20 mg by mouth daily.) 90 tablet 2   lubiprostone (AMITIZA) 24 MCG capsule TAKE 1 CAPSULE BY MOUTH TWICE DAILY WITH MEALS (Patient taking differently: Take 24 mcg by mouth 2 (two) times daily with a meal.) 60 capsule 5   metoprolol tartrate (LOPRESSOR) 50 MG tablet Take 50 mg by mouth daily.     oxyCODONE-acetaminophen (PERCOCET) 7.5-325 MG tablet Take 1 tablet by mouth every 4 (four) hours as needed. 180 tablet 0   pantoprazole (PROTONIX) 40 MG tablet TAKE 1 TABLET(40 MG) BY MOUTH DAILY (Patient taking differently: Take 40 mg by mouth daily. TAKE 1 TABLET(40 MG) BY MOUTH DAILY) 90 tablet 2   polyethylene glycol powder (MIRALAX) 17 GM/SCOOP powder Take one capful daily. Hold for diarrhea. 527 g 5   simvastatin (ZOCOR) 20 MG tablet TAKE 1 TABLET(20 MG) BY MOUTH AT BEDTIME (Patient taking differently: Take 20 mg by mouth daily at 6 PM.) 90 tablet 2   tamsulosin (FLOMAX) 0.4 MG CAPS capsule TAKE 1 CAPSULE(0.4 MG) BY MOUTH DAILY (Patient taking differently: Take 0.4 mg by mouth daily.) 90 capsule 2   traZODone (DESYREL) 100 MG tablet Take 100 mg by mouth at bedtime.     No current facility-administered medications for this visit.    SURGICAL HISTORY:  Past Surgical History:  Procedure Laterality Date   BIOPSY  12/01/2016   Procedure: BIOPSY;  Surgeon: Daneil Dolin, MD;  [facility];  Service: Endoscopy;;  duodenum, gastric, esophagus   BIOPSY  07/15/2022   Procedure: BIOPSY;  Surgeon: Daneil Dolin, MD;  [facility];  Service: Endoscopy;;  duodenal, mass;   CHOLECYSTECTOMY     COLONOSCOPY  03/23/11   Dr. Gala Romney  pancolonic  diverticula, hemorrhoids, tubular adenoma.. next tcs 03/2016   COLONOSCOPY WITH PROPOFOL N/A 12/01/2016   inadequate bowel prep precluded exam   ESOPHAGOGASTRODUODENOSCOPY  02/05/08   goblet cell metaplasia/negative for H.pylori   ESOPHAGOGASTRODUODENOSCOPY  03/23/11   Dr. Gala Romney, barretts, hiatal hernia   ESOPHAGOGASTRODUODENOSCOPY (EGD) WITH PROPOFOL N/A 12/01/2016   Dr. Gala Romney: Large amount of retained gastric contents precluded completion of the stomach. Mucosal changes were found in the stomach. Erosions and somewhat scalloped appearing mucosa present, reactive  gastritis/no H pylori. Barrett's esophagus noted, no dysplasia on biopsy. Duodenal biopsies taken as well, benign, no evidence of eosinophilia.   ESOPHAGOGASTRODUODENOSCOPY (EGD) WITH PROPOFOL N/A 07/15/2022   Procedure: ESOPHAGOGASTRODUODENOSCOPY (EGD) WITH PROPOFOL;  Surgeon: Daneil Dolin, MD;  [facility];  Service: Endoscopy;  Laterality: N/A;   IR FLUORO GUIDED NEEDLE PLC ASPIRATION/INJECTION LOC  12/13/2018   IR KYPHO LUMBAR INC FX REDUCE BONE BX UNI/BIL CANNULATION INC/IMAGING  12/13/2018   IR RADIOLOGY PERIPHERAL GUIDED IV START  12/13/2018   IR US GUIDE VASC ACCESS LEFT  12/13/2018   MASS BIOPSY  11/01/2012   Procedure: NECK MASS BIOPSY;  Surgeon: Ascencion Dike, MD;  Location: [facility];  Service: ENT;  Laterality: Right;  Excisional Bx Right Neck Mass; attempted external jugular cutdown of left side   PAROTIDECTOMY  11/24/2012   Procedure: PAROTIDECTOMY;  Surgeon: Ascencion Dike, MD;  Location: [facility];  Service: ENT;  Laterality: N/A;  Total parotidectomy   PLEURECTOMY     right lymph node removal Right    behind right ear   Right video-assisted thoracic surgery, pleurectomy, and pleurodesis  2011   VIDEO BRONCHOSCOPY WITH ENDOBRONCHIAL NAVIGATION N/A 04/26/2018   Procedure: VIDEO BRONCHOSCOPY WITH ENDOBRONCHIAL NAVIGATION;  Surgeon: Melrose Nakayama, MD;  Location: [facility];  Service: Thoracic;  Laterality: N/A;    REVIEW OF  SYSTEMS:  A comprehensive review of systems was negative except for: Constitutional: positive for fatigue Respiratory: positive for dyspnea on exertion   PHYSICAL EXAMINATION: General appearance: alert, cooperative, fatigued, and no distress Head: Normocephalic, without obvious abnormality, atraumatic Neck: no adenopathy, no JVD, supple, symmetrical, trachea midline, and thyroid not enlarged, symmetric, no tenderness/mass/nodules Lymph nodes: Cervical, supraclavicular, and axillary nodes normal. Resp: clear to auscultation bilaterally Back: symmetric, no curvature. ROM normal. No CVA tenderness. Cardio: regular rate and rhythm, S1, S2 normal, no murmur, click, rub or gallop GI: soft, non-tender; bowel sounds normal; no masses,  no organomegaly Extremities: extremities normal, atraumatic, no cyanosis or edema  ECOG PERFORMANCE STATUS: 1 - Symptomatic but completely ambulatory  Blood pressure 130/78, pulse 69, temperature 98.2 F (36.8 C), temperature source Oral, resp. rate 14, height _0  (1.854 m), weight 189 lb (85.7 kg), SpO2 99 %.  LABORATORY DATA: Lab Results  Component Value Date   WBC 7.4 11/01/2022   HGB 9.6 (L) 11/01/2022   HCT 31.7 (L) 11/01/2022   MCV 65.8 (L) 11/01/2022   PLT 275 11/01/2022      Chemistry      Component Value Date/Time   NA 136 11/01/2022 1442   NA 143 12/16/2020 0918   K 4.3 11/01/2022 1442   K 4.3 08/16/2011 0957   CL 103 11/01/2022 1442   CO2 29 11/01/2022 1442   BUN 28 (H) 11/01/2022 1442   BUN 28 (H) 12/16/2020 0918   BUN 23 08/16/2011 0957   CREATININE 1.47 (H) 11/01/2022 1442   CREATININE 1.69 (H) 09/21/2021 1242      Component Value Date/Time   CALCIUM 9.3 11/01/2022 1442   CALCIUM 10.3 08/16/2011 0957   CALCIUM 11.4 (H) 08/12/2011 1349   ALKPHOS 100 11/01/2022 1442   ALKPHOS 80 08/16/2011 0957   AST 8 (L) 11/01/2022 1442   ALT 8 11/01/2022 1442   BILITOT 0.3 11/01/2022 1442       RADIOGRAPHIC STUDIES: CT Chest Wo  Contrast  Result Date: 10/22/2022 CLINICAL DATA:  Non small-cell lung cancer. Restaging. * Tracking Code: BO * EXAM: CT CHEST WITHOUT CONTRAST TECHNIQUE:  Multidetector CT imaging of the chest was performed following the standard protocol without IV contrast. RADIATION DOSE REDUCTION: This exam was performed according to the departmental dose-optimization program which includes automated exposure control, adjustment of the mA and/or kV according to patient size and/or use of iterative reconstruction technique. COMPARISON:  11/17/2020 FINDINGS: Cardiovascular: The heart size is normal. No substantial pericardial effusion. Coronary artery calcification is evident. Mild atherosclerotic calcification is noted in the wall of the thoracic aorta. Ascending thoracic aorta measures 4.4 cm diameter Mediastinum/Nodes: 1.5 cm posterior right thyroid nodule evident. No mediastinal lymphadenopathy. No evidence for gross hilar lymphadenopathy although assessment is limited by the lack of intravenous contrast on the current study. The esophagus has normal imaging features. There is no axillary lymphadenopathy. Lungs/Pleura: Centrilobular and paraseptal emphysema evident. Bolus changes noted left lung apex. Architectural distortion and postsurgical scarring noted posterior right lung apex, stable. Bandlike consolidative opacity in the left lung apex is not substantially changed in the interval. There is a new small loculated pleural fluid collection in the posterior left upper chest (see axial image 67/3) Cylindrical bronchiectasis with bronchial wall thickening noted in both lower lungs, similar to prior. No new suspicious pulmonary nodule or mass. Upper Abdomen: Multiple cysts identified in both kidneys similar to recent CT abdomen/pelvis. Stable left adrenal gland thickening without nodule or mass. Musculoskeletal: No worrisome lytic or sclerotic osseous abnormality. Degenerative changes are noted in both shoulders. IMPRESSION:  1. Interval development of a small loculated pleural fluid collection in the posterior left upper chest in the region of previously characterized bandlike pleuroparenchymal scarring. Given that this represents a new finding, consider follow-up CT chest in 3 months to ensure stability. The. 2. Stable appearance of bandlike consolidative opacity in the left lung apex. 3. No new suspicious pulmonary nodule or mass. 4. 1.5 cm posterior right thyroid nodule. This is new since neck CT of 08/19/2020. Recommend thyroid US (ref: J Am Coll Radiol. 2015 Feb;12(2): 143-50). 5. 4.4 cm diameter ascending thoracic aorta. Recommend annual imaging followup by CTA or MRA. This recommendation follows 2010 ACCF/AHA/AATS/ACR/ASA/SCA/SCAI/SIR/STS/SVM Guidelines for the Diagnosis and Management of Patients with Thoracic Aortic Disease. Circulation. 2010; 121: D408-X448. Aortic aneurysm NOS (ICD10-I71.9) 6. Aortic Atherosclerosis (ICD10-I70.0) and Emphysema (ICD10-J43.9). Electronically Signed   By: Misty Stanley M.D.   On: 10/22/2022 13:41     ASSESSMENT AND PLAN: This is a very pleasant 76 years old African-American male with history of stage Ib non-small cell lung cancer status post SBRT under the care of Dr. Isidore Moos and he is followed with her for monitoring of his condition. The patient was referred to [place] for evaluation of MGUS and to rule out multiple myeloma.  His protein study showed elevated light chains but his bone marrow biopsy and aspirate showed only 5% of plasma cells. The patient is currently on observation and he is doing fine. He had repeat CT scan of the chest performed recently.  I personally and independently reviewed the scan images and discussed the results with the patient and his wife. His scan showed no concerning finding except for the small loculated pleural fluid collection in the posterior left upper chest that previously characterized as bandlike pleural-parenchymal scarring. I recommended for the  patient to continue on observation with repeat CT scan of the chest in 6 months. For the MGUS, his myeloma panel are stable and he will continue on observation. The patient was advised to call immediately if he has any other concerning symptoms in the interval. The patient voices  understanding of current disease status and treatment options and is in agreement with the current care plan.  All questions were answered. The patient knows to call the clinic with any problems, questions or concerns. We can certainly see the patient much sooner if necessary.  Disclaimer: This note was dictated with voice recognition software. Similar sounding words can inadvertently be transcribed and may not be corrected upon review.

## 2022-11-02 DIAGNOSIS — I69351 Hemiplegia and hemiparesis following cerebral infarction affecting right dominant side: Secondary | ICD-10-CM | POA: Diagnosis not present

## 2022-11-07 DIAGNOSIS — I69351 Hemiplegia and hemiparesis following cerebral infarction affecting right dominant side: Secondary | ICD-10-CM | POA: Diagnosis not present

## 2022-11-08 DIAGNOSIS — I69351 Hemiplegia and hemiparesis following cerebral infarction affecting right dominant side: Secondary | ICD-10-CM | POA: Diagnosis not present

## 2022-11-09 DIAGNOSIS — I69351 Hemiplegia and hemiparesis following cerebral infarction affecting right dominant side: Secondary | ICD-10-CM | POA: Diagnosis not present

## 2022-11-11 DIAGNOSIS — R293 Abnormal posture: Secondary | ICD-10-CM | POA: Diagnosis not present

## 2022-11-11 DIAGNOSIS — M6281 Muscle weakness (generalized): Secondary | ICD-10-CM | POA: Diagnosis not present

## 2022-11-11 DIAGNOSIS — R269 Unspecified abnormalities of gait and mobility: Secondary | ICD-10-CM | POA: Diagnosis not present

## 2022-11-11 DIAGNOSIS — I69351 Hemiplegia and hemiparesis following cerebral infarction affecting right dominant side: Secondary | ICD-10-CM | POA: Diagnosis not present

## 2022-11-14 DIAGNOSIS — I69351 Hemiplegia and hemiparesis following cerebral infarction affecting right dominant side: Secondary | ICD-10-CM | POA: Diagnosis not present

## 2022-11-14 DIAGNOSIS — R293 Abnormal posture: Secondary | ICD-10-CM | POA: Diagnosis not present

## 2022-11-14 DIAGNOSIS — R269 Unspecified abnormalities of gait and mobility: Secondary | ICD-10-CM | POA: Diagnosis not present

## 2022-11-14 DIAGNOSIS — M6281 Muscle weakness (generalized): Secondary | ICD-10-CM | POA: Diagnosis not present

## 2022-11-15 DIAGNOSIS — R269 Unspecified abnormalities of gait and mobility: Secondary | ICD-10-CM | POA: Diagnosis not present

## 2022-11-15 DIAGNOSIS — R293 Abnormal posture: Secondary | ICD-10-CM | POA: Diagnosis not present

## 2022-11-15 DIAGNOSIS — I69351 Hemiplegia and hemiparesis following cerebral infarction affecting right dominant side: Secondary | ICD-10-CM | POA: Diagnosis not present

## 2022-11-15 DIAGNOSIS — M6281 Muscle weakness (generalized): Secondary | ICD-10-CM | POA: Diagnosis not present

## 2022-11-16 DIAGNOSIS — I69351 Hemiplegia and hemiparesis following cerebral infarction affecting right dominant side: Secondary | ICD-10-CM | POA: Diagnosis not present

## 2022-11-16 DIAGNOSIS — M6281 Muscle weakness (generalized): Secondary | ICD-10-CM | POA: Diagnosis not present

## 2022-11-16 DIAGNOSIS — R293 Abnormal posture: Secondary | ICD-10-CM | POA: Diagnosis not present

## 2022-11-16 DIAGNOSIS — R269 Unspecified abnormalities of gait and mobility: Secondary | ICD-10-CM | POA: Diagnosis not present

## 2022-11-17 DIAGNOSIS — M6281 Muscle weakness (generalized): Secondary | ICD-10-CM | POA: Diagnosis not present

## 2022-11-17 DIAGNOSIS — I2510 Atherosclerotic heart disease of native coronary artery without angina pectoris: Secondary | ICD-10-CM | POA: Diagnosis not present

## 2022-11-17 DIAGNOSIS — I7789 Other specified disorders of arteries and arterioles: Secondary | ICD-10-CM | POA: Diagnosis not present

## 2022-11-17 DIAGNOSIS — I69351 Hemiplegia and hemiparesis following cerebral infarction affecting right dominant side: Secondary | ICD-10-CM | POA: Diagnosis not present

## 2022-11-17 DIAGNOSIS — R269 Unspecified abnormalities of gait and mobility: Secondary | ICD-10-CM | POA: Diagnosis not present

## 2022-11-17 DIAGNOSIS — J90 Pleural effusion, not elsewhere classified: Secondary | ICD-10-CM | POA: Diagnosis not present

## 2022-11-17 DIAGNOSIS — M62838 Other muscle spasm: Secondary | ICD-10-CM | POA: Diagnosis not present

## 2022-11-17 DIAGNOSIS — D7389 Other diseases of spleen: Secondary | ICD-10-CM | POA: Diagnosis not present

## 2022-11-17 DIAGNOSIS — I10 Essential (primary) hypertension: Secondary | ICD-10-CM | POA: Diagnosis not present

## 2022-11-17 DIAGNOSIS — R293 Abnormal posture: Secondary | ICD-10-CM | POA: Diagnosis not present

## 2022-11-17 DIAGNOSIS — E041 Nontoxic single thyroid nodule: Secondary | ICD-10-CM | POA: Diagnosis not present

## 2022-11-17 DIAGNOSIS — M109 Gout, unspecified: Secondary | ICD-10-CM | POA: Diagnosis not present

## 2022-11-18 DIAGNOSIS — R293 Abnormal posture: Secondary | ICD-10-CM | POA: Diagnosis not present

## 2022-11-18 DIAGNOSIS — R269 Unspecified abnormalities of gait and mobility: Secondary | ICD-10-CM | POA: Diagnosis not present

## 2022-11-18 DIAGNOSIS — I69351 Hemiplegia and hemiparesis following cerebral infarction affecting right dominant side: Secondary | ICD-10-CM | POA: Diagnosis not present

## 2022-11-18 DIAGNOSIS — M6281 Muscle weakness (generalized): Secondary | ICD-10-CM | POA: Diagnosis not present

## 2022-11-22 ENCOUNTER — Encounter: Payer: Self-pay | Admitting: Internal Medicine

## 2022-11-22 ENCOUNTER — Ambulatory Visit (INDEPENDENT_AMBULATORY_CARE_PROVIDER_SITE_OTHER): Payer: Medicare Other | Admitting: Internal Medicine

## 2022-11-22 VITALS — BP 124/84 | HR 56 | Temp 97.2°F | Ht 73.0 in

## 2022-11-22 DIAGNOSIS — R1011 Right upper quadrant pain: Secondary | ICD-10-CM | POA: Diagnosis not present

## 2022-11-22 DIAGNOSIS — I69351 Hemiplegia and hemiparesis following cerebral infarction affecting right dominant side: Secondary | ICD-10-CM | POA: Diagnosis not present

## 2022-11-22 DIAGNOSIS — M6281 Muscle weakness (generalized): Secondary | ICD-10-CM | POA: Diagnosis not present

## 2022-11-22 DIAGNOSIS — R293 Abnormal posture: Secondary | ICD-10-CM | POA: Diagnosis not present

## 2022-11-22 DIAGNOSIS — K5909 Other constipation: Secondary | ICD-10-CM

## 2022-11-22 DIAGNOSIS — K219 Gastro-esophageal reflux disease without esophagitis: Secondary | ICD-10-CM | POA: Diagnosis not present

## 2022-11-22 DIAGNOSIS — R269 Unspecified abnormalities of gait and mobility: Secondary | ICD-10-CM | POA: Diagnosis not present

## 2022-11-22 DIAGNOSIS — E041 Nontoxic single thyroid nodule: Secondary | ICD-10-CM | POA: Diagnosis not present

## 2022-11-22 MED ORDER — MOTEGRITY 2 MG PO TABS: 1.0000 | ORAL_TABLET | Freq: Every day | ORAL | 11 refills | Status: DC

## 2022-11-22 NOTE — Addendum Note (Signed)
Addended by: Orland Jarred on: 11/22/2022 02:58 PM   Modules accepted: Orders

## 2022-11-22 NOTE — Progress Notes (Signed)
Primary Care Physician:  Hal Morales, DO Primary Gastroenterologist:  Dr.   Pre-Procedure History & Physical: HPI:  [name] is a 76 y.o. male here for   He has a complex medical history including residual right-sided hemiparesis, hepatic steatosis, renal insufficiency, non-small cell lung cancer, lymphoma, adenocarcinoma of the parotid gland, MGUS, Barrett's esophagus, diabetes, GERD, gastroparesis, history of eosinophilic esophagitis treated. Recent upper GI bleed earlier this year worked up with EGD which revealed a large hemorrhagic gastric polyp which was removed biopsies negative for H. pylori malignancy.  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  Chronic iron deficiency anemia last colonoscopy 2012 poor prep pancolonic diverticulosis.  Patient has multiple significant comorbidities which preclude further and invasive studies including colonoscopy and attempted endoscopic resection of the duodenal adenoma.  This has been previously discussed with the patient and his spouse and again today in the office.  Certainly not a candidate for any type of surgery.  Today:  Right-sided abdominal pain.  CT earlier this year demonstrated no critical mesenteric, stenosis or other acute issue.  CT imaging did note a large amount of stool throughout his colon.  He tells me that MiraLAX once daily and MSET is a 24 mcg twice daily is not adequate for constipation.  Far as I know has not been tried on [employer].  He is not having any upper GI tract symptoms now specifically denies reflux odynophagia or dysphagia.  Appetite is described as being good.  No nausea or vomiting.  GERD well-controlled on Protonix 40 mg daily  Past Medical History:  Diagnosis Date   Arthritis    Asthma    "hx of"   Barrett's esophagus    EGD 03/23/2011 & EGD 2/09 bx proven   BPH (benign prostatic hyperplasia)    Cancer of parotid gland ([place]) 11/23/12   Adenocarcinoma    Chronic abdominal pain    Chronic constipation    Colon polyp 03/23/2011   tubular adenoma, Dr. Gala Romney   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 ([place]) 07/15/2011   CVA (cerebral infarction) 1998   right sided deficit   Delayed gastric emptying 2018   Diverticulosis    TCS 03/23/11 pancolonic diverticula &TCS 5/08, pancolonic diverticula   DM (diabetes mellitus) ([place])    Edema of lower extremity 12/21/12   bilateral    Esotropia of left eye    GERD (gastroesophageal reflux disease)    Glaucoma (increased eye pressure)    Gout    Gout    Hemorrhagic colitis 06/06/2012.   Hemorrhoids, internal 03/23/2011   tcs by Dr. Gala Romney   Hepatitis    esosiniphilic, tx with prednisone   Hiatal hernia    History of radiation therapy 05/21/18- 05/30/18   Left Lung/ 54 Gy delivered in 3 fractions of 18 Gy. SBRT   HTN (hypertension)    Hx of radiation therapy 1974   right base of skull area-lymphoma   Hyperlipidemia    Lower facial weakness    Right   Lymphoma ([place]) 1974   XRT at [facility], right base of skull area   Neuropathy    Non-small cell lung cancer ([place]) dx'd 04/26/18   Peripheral edema    R>L legs   Rash    chronic, recurrent, R>L legs   Renal insufficiency    Steatohepatitis    liver biopsy 2009   Stroke [facility]) 1998   right hemiparesis/plegia    Past Surgical History:  Procedure Laterality Date  BIOPSY  12/01/2016   Procedure: BIOPSY;  Surgeon: Daneil Dolin, MD;  [facility]: [facility];  Service: Endoscopy;;  duodenum, gastric, esophagus   BIOPSY  07/15/2022   Procedure: BIOPSY;  Surgeon: Daneil Dolin, MD;  Location: [facility];  Service: Endoscopy;;  duodenal, mass;   CHOLECYSTECTOMY     COLONOSCOPY  03/23/11   Dr. Gala Romney  pancolonic diverticula, hemorrhoids, tubular adenoma.. next tcs 03/2016   COLONOSCOPY WITH PROPOFOL N/A 12/01/2016   inadequate bowel prep precluded exam   ESOPHAGOGASTRODUODENOSCOPY  02/05/08   goblet cell metaplasia/negative for H.pylori    ESOPHAGOGASTRODUODENOSCOPY  03/23/11   Dr. Gala Romney, barretts, hiatal hernia   ESOPHAGOGASTRODUODENOSCOPY (EGD) WITH PROPOFOL N/A 12/01/2016   Dr. Gala Romney: Large amount of retained gastric contents precluded completion of the stomach. Mucosal changes were found in the stomach. Erosions and somewhat scalloped appearing mucosa present, reactive gastritis/no H pylori. Barrett's esophagus noted, no dysplasia on biopsy. Duodenal biopsies taken as well, benign, no evidence of eosinophilia.   ESOPHAGOGASTRODUODENOSCOPY (EGD) WITH PROPOFOL N/A 07/15/2022   Procedure: ESOPHAGOGASTRODUODENOSCOPY (EGD) WITH PROPOFOL;  Surgeon: Daneil Dolin, MD;  [facility];  Service: Endoscopy;  Laterality: N/A;   IR FLUORO GUIDED NEEDLE PLC ASPIRATION/INJECTION LOC  12/13/2018   IR KYPHO LUMBAR INC FX REDUCE BONE BX UNI/BIL CANNULATION INC/IMAGING  12/13/2018   IR RADIOLOGY PERIPHERAL GUIDED IV START  12/13/2018   IR US GUIDE VASC ACCESS LEFT  12/13/2018   MASS BIOPSY  11/01/2012   Procedure: NECK MASS BIOPSY;  Surgeon: Ascencion Dike, MD;  Location: [facility];  Service: ENT;  Laterality: Right;  Excisional Bx Right Neck Mass; attempted external jugular cutdown of left side   PAROTIDECTOMY  11/24/2012   Procedure: PAROTIDECTOMY;  Surgeon: Ascencion Dike, MD;  Location: [facility];  Service: ENT;  Laterality: N/A;  Total parotidectomy   PLEURECTOMY     right lymph node removal Right    behind right ear   Right video-assisted thoracic surgery, pleurectomy, and pleurodesis  2011   VIDEO BRONCHOSCOPY WITH ENDOBRONCHIAL NAVIGATION N/A 04/26/2018   Procedure: VIDEO BRONCHOSCOPY WITH ENDOBRONCHIAL NAVIGATION;  Surgeon: Melrose Nakayama, MD;  Location: [facility];  Service: Thoracic;  Laterality: N/A;    Prior to Admission medications   Medication Sig Start Date End Date Taking? Authorizing Provider  allopurinol (ZYLOPRIM) 300 MG tablet TAKE 1 TABLET(300 MG) BY MOUTH DAILY Patient taking differently: Take 300 mg by mouth daily. TAKE 1  TABLET(300 MG) BY MOUTH DAILY 12/10/21  Yes [name], MD  amLODipine (NORVASC) 10 MG tablet Take 1 tablet (10 mg total) by mouth daily. 12/10/21  Yes [name], MD  ASPIRIN ADULT LOW STRENGTH 81 MG tablet Take 1 tablet (81 mg total) by mouth daily. Restart in 1 week 07/17/22  Yes Kathie Dike, MD  b complex-vitamin c-folic acid (NEPHRO-VITE) 0.8 MG TABS tablet Take 1 tablet by mouth at bedtime.   Yes [provider]  Baclofen 5 MG TABS Take 1 tablet by mouth 3 (three) times daily. 08/22/22  Yes [provider]  buPROPion ER (WELLBUTRIN SR) 100 MG 12 hr tablet Take 100 mg by mouth 2 (two) times daily. 02/03/22  Yes [provider]  cinacalcet (SENSIPAR) 30 MG tablet Take 30 mg by mouth daily with breakfast. 02/03/22  Yes [provider]  cloNIDine (CATAPRES) 0.2 MG tablet Take 1 tablet (0.2 mg total) by mouth 3 (three) times daily. TAKE 1 TABLET(0.2 MG) BY MOUTH THREE TIMES DAILY 12/10/21  Yes [name], MD  cyanocobalamin (VITAMIN B12) 1000 MCG tablet Take 1 tablet (1,000 mcg total) by mouth daily. 07/17/22  Yes Kathie Dike, MD  gabapentin (NEURONTIN) 300 MG capsule Take 2 capsules (600 mg total) by mouth 2 (two) times daily. May also take 2 capsules (600 mg total) daily as needed. Patient taking differently: 300 MG Twice a day 12/10/21  Yes [name], MD  glucose blood (ONETOUCH VERIO) test strip Use as directed to check Blood Glucose twice daily 11/25/20  Yes Reardon, Loree Fee J, NP  insulin glargine (LANTUS SOLOSTAR) 100 UNIT/ML Solostar Pen Inject 30 Units into the skin at bedtime. Patient taking differently: Inject 15 Units into the skin at bedtime. 03/04/21  Yes Reardon, Juanetta Beets, NP  iron polysaccharides (NIFEREX) 150 MG capsule TAKE 1 CAPSULE(150 MG) BY MOUTH DAILY Patient taking differently: Take 150 mg by mouth daily. 07/07/21  Yes [name], MD  Lancets (ONETOUCH DELICA PLUS [id number]) MISC USE TO TEST BLOOD SUGAR  FOUR TIMES DAILY 07/28/21  Yes Nida, Marella Chimes, MD  lisinopril (ZESTRIL) 20 MG tablet TAKE 1 TABLET(20 MG) BY MOUTH DAILY Patient taking differently: Take 20 mg by mouth daily. 06/18/21  Yes [name], MD  lubiprostone (AMITIZA) 24 MCG capsule TAKE 1 CAPSULE BY MOUTH TWICE DAILY WITH MEALS Patient taking differently: Take 24 mcg by mouth 2 (two) times daily with a meal. 04/07/22  Yes [name], MD  Magnesium 400 MG CAPS Take by mouth. 09/27/22 09/27/23 Yes [provider]  metoprolol tartrate (LOPRESSOR) 50 MG tablet Take 50 mg by mouth daily. 12/10/21  Yes [provider]  oxyCODONE-acetaminophen (PERCOCET) 7.5-325 MG tablet Take 1 tablet by mouth every 4 (four) hours as needed. 08/10/21  Yes [name], MD  pantoprazole (PROTONIX) 40 MG tablet TAKE 1 TABLET(40 MG) BY MOUTH DAILY Patient taking differently: Take 40 mg by mouth daily. TAKE 1 TABLET(40 MG) BY MOUTH DAILY 05/21/21  Yes [name], MD  polyethylene glycol powder (MIRALAX) 17 GM/SCOOP powder Take one capful daily. Hold for diarrhea. 08/24/22  Yes Mahala Menghini, PA-C  simvastatin (ZOCOR) 20 MG tablet TAKE 1 TABLET(20 MG) BY MOUTH AT BEDTIME Patient taking differently: Take 20 mg by mouth daily at 6 PM. 05/21/21  Yes Pickard, Cammie Mcgee, MD  tamsulosin (FLOMAX) 0.4 MG CAPS capsule TAKE 1 CAPSULE(0.4 MG) BY MOUTH DAILY Patient taking differently: Take 0.4 mg by mouth daily. 12/10/21  Yes [name], MD  traZODone (DESYREL) 100 MG tablet Take 100 mg by mouth at bedtime. 03/07/22  Yes [provider]    Allergies as of 11/22/2022   (No Known Allergies)    Family History  Problem Relation Age of Onset   Heart failure Mother    Heart failure Father    Heart failure Sister    Heart failure Son    Colon cancer Neg Hx     Social History   Socioeconomic History   Marital status: Married    Spouse name: Not on file   Number of children: 1   Years of education: Not on  file   Highest education level: Not on file  Occupational History   Occupation: disabled  Tobacco Use   Smoking status: Former    Packs/day: 3.00    Years: 25.00    Total pack years: 75.00    Types: Cigarettes    Quit date: 03/01/1997    Years since quitting: 25.7   Smokeless tobacco: Never  Vaping Use   Vaping Use: Never used  Substance and Sexual Activity   Alcohol use: No   Drug use: No   Sexual activity: Not Currently  Other Topics Concern   Not on file  Social History Narrative   Not on file   Social Determinants of Health   Financial Resource Strain: Low Risk  (05/29/2020)   Overall Financial Resource Strain (CARDIA)    Difficulty of Paying Living Expenses: Not very hard  Food Insecurity: Not on file  Transportation Needs: Not on file  Physical Activity: Not on file  Stress: Not on file  Social Connections: Not on file  Intimate Partner Violence: Not At Risk (08/31/2018)   Humiliation, Afraid, Rape, and Kick questionnaire    Fear of Current or Ex-Partner: No    Emotionally Abused: No    Physically Abused: No    Sexually Abused: No    Review of Systems: See HPI, otherwise negative ROS  Physical Exam: BP 124/84 (BP Location: Left Arm, Patient Position: Sitting, Cuff Size: Large)   Pulse (!) 56   Temp (!) 97.2 F (36.2 C) (Temporal)   Ht 6' 1"  (1.854 m)   SpO2 97%   BMI 24.94 kg/m  General:   Very pleasant elderly gentleman with obvious right hemiplegia wheelchair-bound accompanied by his wife today no acute distress.  Alert,  Neck:  Supple; no masses or thyromegaly. No significant cervical adenopathy. Lungs:  Clear throughout to auscultation.   No wheezes, crackles, or rhonchi. No acute distress. Heart:  Regular rate and rhythm; no murmurs, clicks, rubs,  or gallops. Abdomen: Nondistended.  Positive bowel sounds soft and nontender pulses:  Normal pulses noted. Extremities:  Without clubbing or edema.  Impression/Plan: Very pleasant but debilitated  76 year old gentleman with numerous comorbidities as chronicled above presents back to the office with chronic sided abdominal pain in the setting of refractory constipation.  Amitiza 24 mcg twice daily and MiraLAX once daily has not been effective to achieve adequate bowel function.  Etiology of constipation is multifactorial in etiology including opioid induced.  If we can get bowel function improved, his right-sided abdominal pain will improve.  Recent hospitalization for GI bleeding noted.  He has chronic iron deficiency anemia and an adenoma in his small bowel with a least high-grade dysplasia.  He could easily be harboring overt cancer in this polyp ; likewise, his colon could be harboring high-grade pathology as well.[name]  Unfortunately, patient is not a candidate for any type of invasive procedures.  Significantly increased risk of morbidity mortality with anesthesia given his comorbidities.  I discussed the situation at length with the patient and patient's family.  He may have an underlying tumor.  However, he would not be a candidate for any sort of treatment as well.  Recommendations:  I would favor optimize quality of life.  Lets get bowel function improved  GERD well-controlled on Protonix-continue that medication 40 mg daily  Lets start patient on Motegrity 2 mg daily.  Will provide samples if available.  Otherwise, we will call in a prescription for Motegrity 2 mg orally daily dispense 30 with 11 refills  Increase MiraLAX to (2) 17 g doses daily.  Follow H&H  Office visit here in 3 months   Notice: This dictation was prepared with Dragon dictation along with smaller phrase technology. Any transcriptional errors that result from this process are unintentional and may not be corrected upon review.

## 2022-11-22 NOTE — Patient Instructions (Signed)
It was good to see you again today!  We will start Motegrity 2 mg daily to combat constipation  In addition, increase MiraLAX to 17 g orally twice daily  Continue Protonix 40 mg daily for acid reflux  Will plan to see you back in 3 months

## 2022-11-23 DIAGNOSIS — R269 Unspecified abnormalities of gait and mobility: Secondary | ICD-10-CM | POA: Diagnosis not present

## 2022-11-23 DIAGNOSIS — I69351 Hemiplegia and hemiparesis following cerebral infarction affecting right dominant side: Secondary | ICD-10-CM | POA: Diagnosis not present

## 2022-11-23 DIAGNOSIS — M6281 Muscle weakness (generalized): Secondary | ICD-10-CM | POA: Diagnosis not present

## 2022-11-23 DIAGNOSIS — E041 Nontoxic single thyroid nodule: Secondary | ICD-10-CM | POA: Diagnosis not present

## 2022-11-23 DIAGNOSIS — R293 Abnormal posture: Secondary | ICD-10-CM | POA: Diagnosis not present

## 2022-11-24 DIAGNOSIS — R293 Abnormal posture: Secondary | ICD-10-CM | POA: Diagnosis not present

## 2022-11-24 DIAGNOSIS — I69351 Hemiplegia and hemiparesis following cerebral infarction affecting right dominant side: Secondary | ICD-10-CM | POA: Diagnosis not present

## 2022-11-24 DIAGNOSIS — R269 Unspecified abnormalities of gait and mobility: Secondary | ICD-10-CM | POA: Diagnosis not present

## 2022-11-24 DIAGNOSIS — M6281 Muscle weakness (generalized): Secondary | ICD-10-CM | POA: Diagnosis not present

## 2022-11-25 DIAGNOSIS — R293 Abnormal posture: Secondary | ICD-10-CM | POA: Diagnosis not present

## 2022-11-25 DIAGNOSIS — I69351 Hemiplegia and hemiparesis following cerebral infarction affecting right dominant side: Secondary | ICD-10-CM | POA: Diagnosis not present

## 2022-11-25 DIAGNOSIS — R269 Unspecified abnormalities of gait and mobility: Secondary | ICD-10-CM | POA: Diagnosis not present

## 2022-11-25 DIAGNOSIS — M6281 Muscle weakness (generalized): Secondary | ICD-10-CM | POA: Diagnosis not present

## 2022-11-28 DIAGNOSIS — I69351 Hemiplegia and hemiparesis following cerebral infarction affecting right dominant side: Secondary | ICD-10-CM | POA: Diagnosis not present

## 2022-11-28 DIAGNOSIS — M6281 Muscle weakness (generalized): Secondary | ICD-10-CM | POA: Diagnosis not present

## 2022-11-28 DIAGNOSIS — R293 Abnormal posture: Secondary | ICD-10-CM | POA: Diagnosis not present

## 2022-11-28 DIAGNOSIS — R269 Unspecified abnormalities of gait and mobility: Secondary | ICD-10-CM | POA: Diagnosis not present

## 2022-11-29 DIAGNOSIS — R293 Abnormal posture: Secondary | ICD-10-CM | POA: Diagnosis not present

## 2022-11-29 DIAGNOSIS — I69351 Hemiplegia and hemiparesis following cerebral infarction affecting right dominant side: Secondary | ICD-10-CM | POA: Diagnosis not present

## 2022-11-29 DIAGNOSIS — M6281 Muscle weakness (generalized): Secondary | ICD-10-CM | POA: Diagnosis not present

## 2022-11-29 DIAGNOSIS — R269 Unspecified abnormalities of gait and mobility: Secondary | ICD-10-CM | POA: Diagnosis not present

## 2022-11-30 DIAGNOSIS — R293 Abnormal posture: Secondary | ICD-10-CM | POA: Diagnosis not present

## 2022-11-30 DIAGNOSIS — M6281 Muscle weakness (generalized): Secondary | ICD-10-CM | POA: Diagnosis not present

## 2022-11-30 DIAGNOSIS — I69351 Hemiplegia and hemiparesis following cerebral infarction affecting right dominant side: Secondary | ICD-10-CM | POA: Diagnosis not present

## 2022-11-30 DIAGNOSIS — R269 Unspecified abnormalities of gait and mobility: Secondary | ICD-10-CM | POA: Diagnosis not present

## 2022-12-01 DIAGNOSIS — I69351 Hemiplegia and hemiparesis following cerebral infarction affecting right dominant side: Secondary | ICD-10-CM | POA: Diagnosis not present

## 2022-12-01 DIAGNOSIS — M6281 Muscle weakness (generalized): Secondary | ICD-10-CM | POA: Diagnosis not present

## 2022-12-01 DIAGNOSIS — R269 Unspecified abnormalities of gait and mobility: Secondary | ICD-10-CM | POA: Diagnosis not present

## 2022-12-01 DIAGNOSIS — R293 Abnormal posture: Secondary | ICD-10-CM | POA: Diagnosis not present

## 2022-12-02 DIAGNOSIS — D509 Iron deficiency anemia, unspecified: Secondary | ICD-10-CM | POA: Diagnosis not present

## 2022-12-07 DIAGNOSIS — R269 Unspecified abnormalities of gait and mobility: Secondary | ICD-10-CM | POA: Diagnosis not present

## 2022-12-07 DIAGNOSIS — R293 Abnormal posture: Secondary | ICD-10-CM | POA: Diagnosis not present

## 2022-12-07 DIAGNOSIS — M6281 Muscle weakness (generalized): Secondary | ICD-10-CM | POA: Diagnosis not present

## 2022-12-07 DIAGNOSIS — I69351 Hemiplegia and hemiparesis following cerebral infarction affecting right dominant side: Secondary | ICD-10-CM | POA: Diagnosis not present

## 2022-12-09 DIAGNOSIS — I69351 Hemiplegia and hemiparesis following cerebral infarction affecting right dominant side: Secondary | ICD-10-CM | POA: Diagnosis not present

## 2022-12-09 DIAGNOSIS — R269 Unspecified abnormalities of gait and mobility: Secondary | ICD-10-CM | POA: Diagnosis not present

## 2022-12-09 DIAGNOSIS — R293 Abnormal posture: Secondary | ICD-10-CM | POA: Diagnosis not present

## 2022-12-09 DIAGNOSIS — M6281 Muscle weakness (generalized): Secondary | ICD-10-CM | POA: Diagnosis not present

## 2022-12-13 DIAGNOSIS — Z741 Need for assistance with personal care: Secondary | ICD-10-CM | POA: Diagnosis not present

## 2022-12-13 DIAGNOSIS — M6281 Muscle weakness (generalized): Secondary | ICD-10-CM | POA: Diagnosis not present

## 2022-12-13 DIAGNOSIS — R293 Abnormal posture: Secondary | ICD-10-CM | POA: Diagnosis not present

## 2022-12-13 DIAGNOSIS — I69351 Hemiplegia and hemiparesis following cerebral infarction affecting right dominant side: Secondary | ICD-10-CM | POA: Diagnosis not present

## 2022-12-13 DIAGNOSIS — R269 Unspecified abnormalities of gait and mobility: Secondary | ICD-10-CM | POA: Diagnosis not present

## 2022-12-14 DIAGNOSIS — M6281 Muscle weakness (generalized): Secondary | ICD-10-CM | POA: Diagnosis not present

## 2022-12-14 DIAGNOSIS — R269 Unspecified abnormalities of gait and mobility: Secondary | ICD-10-CM | POA: Diagnosis not present

## 2022-12-14 DIAGNOSIS — Z741 Need for assistance with personal care: Secondary | ICD-10-CM | POA: Diagnosis not present

## 2022-12-14 DIAGNOSIS — R293 Abnormal posture: Secondary | ICD-10-CM | POA: Diagnosis not present

## 2022-12-14 DIAGNOSIS — I69351 Hemiplegia and hemiparesis following cerebral infarction affecting right dominant side: Secondary | ICD-10-CM | POA: Diagnosis not present

## 2022-12-15 DIAGNOSIS — R269 Unspecified abnormalities of gait and mobility: Secondary | ICD-10-CM | POA: Diagnosis not present

## 2022-12-15 DIAGNOSIS — I69351 Hemiplegia and hemiparesis following cerebral infarction affecting right dominant side: Secondary | ICD-10-CM | POA: Diagnosis not present

## 2022-12-15 DIAGNOSIS — R293 Abnormal posture: Secondary | ICD-10-CM | POA: Diagnosis not present

## 2022-12-15 DIAGNOSIS — M6281 Muscle weakness (generalized): Secondary | ICD-10-CM | POA: Diagnosis not present

## 2022-12-15 DIAGNOSIS — Z741 Need for assistance with personal care: Secondary | ICD-10-CM | POA: Diagnosis not present

## 2022-12-16 DIAGNOSIS — M6281 Muscle weakness (generalized): Secondary | ICD-10-CM | POA: Diagnosis not present

## 2022-12-16 DIAGNOSIS — R293 Abnormal posture: Secondary | ICD-10-CM | POA: Diagnosis not present

## 2022-12-16 DIAGNOSIS — I69351 Hemiplegia and hemiparesis following cerebral infarction affecting right dominant side: Secondary | ICD-10-CM | POA: Diagnosis not present

## 2022-12-16 DIAGNOSIS — R269 Unspecified abnormalities of gait and mobility: Secondary | ICD-10-CM | POA: Diagnosis not present

## 2022-12-16 DIAGNOSIS — Z741 Need for assistance with personal care: Secondary | ICD-10-CM | POA: Diagnosis not present

## 2022-12-19 DIAGNOSIS — G8190 Hemiplegia, unspecified affecting unspecified side: Secondary | ICD-10-CM | POA: Diagnosis not present

## 2022-12-19 DIAGNOSIS — N189 Chronic kidney disease, unspecified: Secondary | ICD-10-CM | POA: Diagnosis not present

## 2022-12-19 DIAGNOSIS — Z741 Need for assistance with personal care: Secondary | ICD-10-CM | POA: Diagnosis not present

## 2022-12-19 DIAGNOSIS — R293 Abnormal posture: Secondary | ICD-10-CM | POA: Diagnosis not present

## 2022-12-19 DIAGNOSIS — I69351 Hemiplegia and hemiparesis following cerebral infarction affecting right dominant side: Secondary | ICD-10-CM | POA: Diagnosis not present

## 2022-12-19 DIAGNOSIS — M6281 Muscle weakness (generalized): Secondary | ICD-10-CM | POA: Diagnosis not present

## 2022-12-19 DIAGNOSIS — R269 Unspecified abnormalities of gait and mobility: Secondary | ICD-10-CM | POA: Diagnosis not present

## 2022-12-20 DIAGNOSIS — Z741 Need for assistance with personal care: Secondary | ICD-10-CM | POA: Diagnosis not present

## 2022-12-20 DIAGNOSIS — R269 Unspecified abnormalities of gait and mobility: Secondary | ICD-10-CM | POA: Diagnosis not present

## 2022-12-20 DIAGNOSIS — R293 Abnormal posture: Secondary | ICD-10-CM | POA: Diagnosis not present

## 2022-12-20 DIAGNOSIS — I69351 Hemiplegia and hemiparesis following cerebral infarction affecting right dominant side: Secondary | ICD-10-CM | POA: Diagnosis not present

## 2022-12-20 DIAGNOSIS — M6281 Muscle weakness (generalized): Secondary | ICD-10-CM | POA: Diagnosis not present

## 2022-12-22 DIAGNOSIS — I69351 Hemiplegia and hemiparesis following cerebral infarction affecting right dominant side: Secondary | ICD-10-CM | POA: Diagnosis not present

## 2022-12-22 DIAGNOSIS — M6281 Muscle weakness (generalized): Secondary | ICD-10-CM | POA: Diagnosis not present

## 2022-12-22 DIAGNOSIS — R293 Abnormal posture: Secondary | ICD-10-CM | POA: Diagnosis not present

## 2022-12-22 DIAGNOSIS — Z741 Need for assistance with personal care: Secondary | ICD-10-CM | POA: Diagnosis not present

## 2022-12-22 DIAGNOSIS — R269 Unspecified abnormalities of gait and mobility: Secondary | ICD-10-CM | POA: Diagnosis not present

## 2022-12-23 DIAGNOSIS — R293 Abnormal posture: Secondary | ICD-10-CM | POA: Diagnosis not present

## 2022-12-23 DIAGNOSIS — M6281 Muscle weakness (generalized): Secondary | ICD-10-CM | POA: Diagnosis not present

## 2022-12-23 DIAGNOSIS — Z741 Need for assistance with personal care: Secondary | ICD-10-CM | POA: Diagnosis not present

## 2022-12-23 DIAGNOSIS — R269 Unspecified abnormalities of gait and mobility: Secondary | ICD-10-CM | POA: Diagnosis not present

## 2022-12-23 DIAGNOSIS — I69351 Hemiplegia and hemiparesis following cerebral infarction affecting right dominant side: Secondary | ICD-10-CM | POA: Diagnosis not present

## 2022-12-26 DIAGNOSIS — R269 Unspecified abnormalities of gait and mobility: Secondary | ICD-10-CM | POA: Diagnosis not present

## 2022-12-26 DIAGNOSIS — M6281 Muscle weakness (generalized): Secondary | ICD-10-CM | POA: Diagnosis not present

## 2022-12-26 DIAGNOSIS — Z741 Need for assistance with personal care: Secondary | ICD-10-CM | POA: Diagnosis not present

## 2022-12-26 DIAGNOSIS — R293 Abnormal posture: Secondary | ICD-10-CM | POA: Diagnosis not present

## 2022-12-26 DIAGNOSIS — I69351 Hemiplegia and hemiparesis following cerebral infarction affecting right dominant side: Secondary | ICD-10-CM | POA: Diagnosis not present

## 2022-12-27 DIAGNOSIS — M6281 Muscle weakness (generalized): Secondary | ICD-10-CM | POA: Diagnosis not present

## 2022-12-27 DIAGNOSIS — R293 Abnormal posture: Secondary | ICD-10-CM | POA: Diagnosis not present

## 2022-12-27 DIAGNOSIS — I69351 Hemiplegia and hemiparesis following cerebral infarction affecting right dominant side: Secondary | ICD-10-CM | POA: Diagnosis not present

## 2022-12-27 DIAGNOSIS — Z741 Need for assistance with personal care: Secondary | ICD-10-CM | POA: Diagnosis not present

## 2022-12-27 DIAGNOSIS — R269 Unspecified abnormalities of gait and mobility: Secondary | ICD-10-CM | POA: Diagnosis not present

## 2022-12-29 DIAGNOSIS — Z741 Need for assistance with personal care: Secondary | ICD-10-CM | POA: Diagnosis not present

## 2022-12-29 DIAGNOSIS — R293 Abnormal posture: Secondary | ICD-10-CM | POA: Diagnosis not present

## 2022-12-29 DIAGNOSIS — R269 Unspecified abnormalities of gait and mobility: Secondary | ICD-10-CM | POA: Diagnosis not present

## 2022-12-29 DIAGNOSIS — M6281 Muscle weakness (generalized): Secondary | ICD-10-CM | POA: Diagnosis not present

## 2022-12-29 DIAGNOSIS — I69351 Hemiplegia and hemiparesis following cerebral infarction affecting right dominant side: Secondary | ICD-10-CM | POA: Diagnosis not present

## 2022-12-30 DIAGNOSIS — R269 Unspecified abnormalities of gait and mobility: Secondary | ICD-10-CM | POA: Diagnosis not present

## 2022-12-30 DIAGNOSIS — I69351 Hemiplegia and hemiparesis following cerebral infarction affecting right dominant side: Secondary | ICD-10-CM | POA: Diagnosis not present

## 2022-12-30 DIAGNOSIS — Z741 Need for assistance with personal care: Secondary | ICD-10-CM | POA: Diagnosis not present

## 2022-12-30 DIAGNOSIS — M6281 Muscle weakness (generalized): Secondary | ICD-10-CM | POA: Diagnosis not present

## 2022-12-30 DIAGNOSIS — R293 Abnormal posture: Secondary | ICD-10-CM | POA: Diagnosis not present

## 2023-01-02 DIAGNOSIS — M6281 Muscle weakness (generalized): Secondary | ICD-10-CM | POA: Diagnosis not present

## 2023-01-02 DIAGNOSIS — R293 Abnormal posture: Secondary | ICD-10-CM | POA: Diagnosis not present

## 2023-01-02 DIAGNOSIS — I69351 Hemiplegia and hemiparesis following cerebral infarction affecting right dominant side: Secondary | ICD-10-CM | POA: Diagnosis not present

## 2023-01-02 DIAGNOSIS — Z741 Need for assistance with personal care: Secondary | ICD-10-CM | POA: Diagnosis not present

## 2023-01-02 DIAGNOSIS — R269 Unspecified abnormalities of gait and mobility: Secondary | ICD-10-CM | POA: Diagnosis not present

## 2023-01-03 DIAGNOSIS — I69351 Hemiplegia and hemiparesis following cerebral infarction affecting right dominant side: Secondary | ICD-10-CM | POA: Diagnosis not present

## 2023-01-03 DIAGNOSIS — Z741 Need for assistance with personal care: Secondary | ICD-10-CM | POA: Diagnosis not present

## 2023-01-03 DIAGNOSIS — R269 Unspecified abnormalities of gait and mobility: Secondary | ICD-10-CM | POA: Diagnosis not present

## 2023-01-03 DIAGNOSIS — M6281 Muscle weakness (generalized): Secondary | ICD-10-CM | POA: Diagnosis not present

## 2023-01-03 DIAGNOSIS — R293 Abnormal posture: Secondary | ICD-10-CM | POA: Diagnosis not present

## 2023-01-05 DIAGNOSIS — I69351 Hemiplegia and hemiparesis following cerebral infarction affecting right dominant side: Secondary | ICD-10-CM | POA: Diagnosis not present

## 2023-01-05 DIAGNOSIS — M6281 Muscle weakness (generalized): Secondary | ICD-10-CM | POA: Diagnosis not present

## 2023-01-05 DIAGNOSIS — Z741 Need for assistance with personal care: Secondary | ICD-10-CM | POA: Diagnosis not present

## 2023-01-05 DIAGNOSIS — R221 Localized swelling, mass and lump, neck: Secondary | ICD-10-CM | POA: Diagnosis not present

## 2023-01-05 DIAGNOSIS — R293 Abnormal posture: Secondary | ICD-10-CM | POA: Diagnosis not present

## 2023-01-05 DIAGNOSIS — R269 Unspecified abnormalities of gait and mobility: Secondary | ICD-10-CM | POA: Diagnosis not present

## 2023-01-06 DIAGNOSIS — I69351 Hemiplegia and hemiparesis following cerebral infarction affecting right dominant side: Secondary | ICD-10-CM | POA: Diagnosis not present

## 2023-01-06 DIAGNOSIS — R269 Unspecified abnormalities of gait and mobility: Secondary | ICD-10-CM | POA: Diagnosis not present

## 2023-01-06 DIAGNOSIS — R293 Abnormal posture: Secondary | ICD-10-CM | POA: Diagnosis not present

## 2023-01-06 DIAGNOSIS — Z741 Need for assistance with personal care: Secondary | ICD-10-CM | POA: Diagnosis not present

## 2023-01-06 DIAGNOSIS — M6281 Muscle weakness (generalized): Secondary | ICD-10-CM | POA: Diagnosis not present

## 2023-01-09 DIAGNOSIS — R293 Abnormal posture: Secondary | ICD-10-CM | POA: Diagnosis not present

## 2023-01-09 DIAGNOSIS — Z741 Need for assistance with personal care: Secondary | ICD-10-CM | POA: Diagnosis not present

## 2023-01-09 DIAGNOSIS — M6281 Muscle weakness (generalized): Secondary | ICD-10-CM | POA: Diagnosis not present

## 2023-01-09 DIAGNOSIS — R269 Unspecified abnormalities of gait and mobility: Secondary | ICD-10-CM | POA: Diagnosis not present

## 2023-01-09 DIAGNOSIS — I69351 Hemiplegia and hemiparesis following cerebral infarction affecting right dominant side: Secondary | ICD-10-CM | POA: Diagnosis not present

## 2023-01-10 DIAGNOSIS — R293 Abnormal posture: Secondary | ICD-10-CM | POA: Diagnosis not present

## 2023-01-10 DIAGNOSIS — R220 Localized swelling, mass and lump, head: Secondary | ICD-10-CM | POA: Diagnosis not present

## 2023-01-10 DIAGNOSIS — R269 Unspecified abnormalities of gait and mobility: Secondary | ICD-10-CM | POA: Diagnosis not present

## 2023-01-10 DIAGNOSIS — I69351 Hemiplegia and hemiparesis following cerebral infarction affecting right dominant side: Secondary | ICD-10-CM | POA: Diagnosis not present

## 2023-01-10 DIAGNOSIS — M6281 Muscle weakness (generalized): Secondary | ICD-10-CM | POA: Diagnosis not present

## 2023-01-10 DIAGNOSIS — Z741 Need for assistance with personal care: Secondary | ICD-10-CM | POA: Diagnosis not present

## 2023-01-11 DIAGNOSIS — Z13228 Encounter for screening for other metabolic disorders: Secondary | ICD-10-CM | POA: Diagnosis not present

## 2023-01-12 DIAGNOSIS — I69351 Hemiplegia and hemiparesis following cerebral infarction affecting right dominant side: Secondary | ICD-10-CM | POA: Diagnosis not present

## 2023-01-12 DIAGNOSIS — Z741 Need for assistance with personal care: Secondary | ICD-10-CM | POA: Diagnosis not present

## 2023-01-12 DIAGNOSIS — R293 Abnormal posture: Secondary | ICD-10-CM | POA: Diagnosis not present

## 2023-01-12 DIAGNOSIS — R269 Unspecified abnormalities of gait and mobility: Secondary | ICD-10-CM | POA: Diagnosis not present

## 2023-01-12 DIAGNOSIS — R221 Localized swelling, mass and lump, neck: Secondary | ICD-10-CM | POA: Diagnosis not present

## 2023-01-12 DIAGNOSIS — M6281 Muscle weakness (generalized): Secondary | ICD-10-CM | POA: Diagnosis not present

## 2023-01-13 ENCOUNTER — Other Ambulatory Visit (HOSPITAL_COMMUNITY): Payer: Self-pay | Admitting: Family Medicine

## 2023-01-13 DIAGNOSIS — Z741 Need for assistance with personal care: Secondary | ICD-10-CM | POA: Diagnosis not present

## 2023-01-13 DIAGNOSIS — R293 Abnormal posture: Secondary | ICD-10-CM | POA: Diagnosis not present

## 2023-01-13 DIAGNOSIS — I69351 Hemiplegia and hemiparesis following cerebral infarction affecting right dominant side: Secondary | ICD-10-CM | POA: Diagnosis not present

## 2023-01-13 DIAGNOSIS — R221 Localized swelling, mass and lump, neck: Secondary | ICD-10-CM

## 2023-01-13 DIAGNOSIS — M6281 Muscle weakness (generalized): Secondary | ICD-10-CM | POA: Diagnosis not present

## 2023-01-13 DIAGNOSIS — R269 Unspecified abnormalities of gait and mobility: Secondary | ICD-10-CM | POA: Diagnosis not present

## 2023-01-14 DIAGNOSIS — I69351 Hemiplegia and hemiparesis following cerebral infarction affecting right dominant side: Secondary | ICD-10-CM | POA: Diagnosis not present

## 2023-01-14 DIAGNOSIS — R269 Unspecified abnormalities of gait and mobility: Secondary | ICD-10-CM | POA: Diagnosis not present

## 2023-01-14 DIAGNOSIS — R293 Abnormal posture: Secondary | ICD-10-CM | POA: Diagnosis not present

## 2023-01-14 DIAGNOSIS — M6281 Muscle weakness (generalized): Secondary | ICD-10-CM | POA: Diagnosis not present

## 2023-01-14 DIAGNOSIS — Z741 Need for assistance with personal care: Secondary | ICD-10-CM | POA: Diagnosis not present

## 2023-01-16 DIAGNOSIS — R293 Abnormal posture: Secondary | ICD-10-CM | POA: Diagnosis not present

## 2023-01-16 DIAGNOSIS — M6281 Muscle weakness (generalized): Secondary | ICD-10-CM | POA: Diagnosis not present

## 2023-01-16 DIAGNOSIS — R269 Unspecified abnormalities of gait and mobility: Secondary | ICD-10-CM | POA: Diagnosis not present

## 2023-01-16 DIAGNOSIS — I69351 Hemiplegia and hemiparesis following cerebral infarction affecting right dominant side: Secondary | ICD-10-CM | POA: Diagnosis not present

## 2023-01-16 DIAGNOSIS — Z741 Need for assistance with personal care: Secondary | ICD-10-CM | POA: Diagnosis not present

## 2023-01-17 DIAGNOSIS — E210 Primary hyperparathyroidism: Secondary | ICD-10-CM | POA: Diagnosis not present

## 2023-01-17 DIAGNOSIS — D472 Monoclonal gammopathy: Secondary | ICD-10-CM | POA: Diagnosis not present

## 2023-01-17 DIAGNOSIS — R293 Abnormal posture: Secondary | ICD-10-CM | POA: Diagnosis not present

## 2023-01-17 DIAGNOSIS — E1122 Type 2 diabetes mellitus with diabetic chronic kidney disease: Secondary | ICD-10-CM | POA: Diagnosis not present

## 2023-01-17 DIAGNOSIS — I69351 Hemiplegia and hemiparesis following cerebral infarction affecting right dominant side: Secondary | ICD-10-CM | POA: Diagnosis not present

## 2023-01-17 DIAGNOSIS — M6281 Muscle weakness (generalized): Secondary | ICD-10-CM | POA: Diagnosis not present

## 2023-01-17 DIAGNOSIS — N189 Chronic kidney disease, unspecified: Secondary | ICD-10-CM | POA: Diagnosis not present

## 2023-01-17 DIAGNOSIS — D631 Anemia in chronic kidney disease: Secondary | ICD-10-CM | POA: Diagnosis not present

## 2023-01-17 DIAGNOSIS — I5032 Chronic diastolic (congestive) heart failure: Secondary | ICD-10-CM | POA: Diagnosis not present

## 2023-01-17 DIAGNOSIS — E1129 Type 2 diabetes mellitus with other diabetic kidney complication: Secondary | ICD-10-CM | POA: Diagnosis not present

## 2023-01-17 DIAGNOSIS — R269 Unspecified abnormalities of gait and mobility: Secondary | ICD-10-CM | POA: Diagnosis not present

## 2023-01-17 DIAGNOSIS — R809 Proteinuria, unspecified: Secondary | ICD-10-CM | POA: Diagnosis not present

## 2023-01-17 DIAGNOSIS — Z741 Need for assistance with personal care: Secondary | ICD-10-CM | POA: Diagnosis not present

## 2023-01-19 DIAGNOSIS — Z741 Need for assistance with personal care: Secondary | ICD-10-CM | POA: Diagnosis not present

## 2023-01-19 DIAGNOSIS — R293 Abnormal posture: Secondary | ICD-10-CM | POA: Diagnosis not present

## 2023-01-19 DIAGNOSIS — M6281 Muscle weakness (generalized): Secondary | ICD-10-CM | POA: Diagnosis not present

## 2023-01-19 DIAGNOSIS — R269 Unspecified abnormalities of gait and mobility: Secondary | ICD-10-CM | POA: Diagnosis not present

## 2023-01-19 DIAGNOSIS — I69351 Hemiplegia and hemiparesis following cerebral infarction affecting right dominant side: Secondary | ICD-10-CM | POA: Diagnosis not present

## 2023-01-20 DIAGNOSIS — R293 Abnormal posture: Secondary | ICD-10-CM | POA: Diagnosis not present

## 2023-01-20 DIAGNOSIS — Z741 Need for assistance with personal care: Secondary | ICD-10-CM | POA: Diagnosis not present

## 2023-01-20 DIAGNOSIS — I69351 Hemiplegia and hemiparesis following cerebral infarction affecting right dominant side: Secondary | ICD-10-CM | POA: Diagnosis not present

## 2023-01-20 DIAGNOSIS — R269 Unspecified abnormalities of gait and mobility: Secondary | ICD-10-CM | POA: Diagnosis not present

## 2023-01-20 DIAGNOSIS — M6281 Muscle weakness (generalized): Secondary | ICD-10-CM | POA: Diagnosis not present

## 2023-01-23 DIAGNOSIS — E119 Type 2 diabetes mellitus without complications: Secondary | ICD-10-CM | POA: Diagnosis not present

## 2023-01-23 DIAGNOSIS — R293 Abnormal posture: Secondary | ICD-10-CM | POA: Diagnosis not present

## 2023-01-23 DIAGNOSIS — R269 Unspecified abnormalities of gait and mobility: Secondary | ICD-10-CM | POA: Diagnosis not present

## 2023-01-23 DIAGNOSIS — I2510 Atherosclerotic heart disease of native coronary artery without angina pectoris: Secondary | ICD-10-CM | POA: Diagnosis not present

## 2023-01-23 DIAGNOSIS — M6281 Muscle weakness (generalized): Secondary | ICD-10-CM | POA: Diagnosis not present

## 2023-01-23 DIAGNOSIS — M109 Gout, unspecified: Secondary | ICD-10-CM | POA: Diagnosis not present

## 2023-01-23 DIAGNOSIS — Z741 Need for assistance with personal care: Secondary | ICD-10-CM | POA: Diagnosis not present

## 2023-01-23 DIAGNOSIS — I69351 Hemiplegia and hemiparesis following cerebral infarction affecting right dominant side: Secondary | ICD-10-CM | POA: Diagnosis not present

## 2023-01-24 DIAGNOSIS — R269 Unspecified abnormalities of gait and mobility: Secondary | ICD-10-CM | POA: Diagnosis not present

## 2023-01-24 DIAGNOSIS — Z741 Need for assistance with personal care: Secondary | ICD-10-CM | POA: Diagnosis not present

## 2023-01-24 DIAGNOSIS — R293 Abnormal posture: Secondary | ICD-10-CM | POA: Diagnosis not present

## 2023-01-24 DIAGNOSIS — M6281 Muscle weakness (generalized): Secondary | ICD-10-CM | POA: Diagnosis not present

## 2023-01-24 DIAGNOSIS — I69351 Hemiplegia and hemiparesis following cerebral infarction affecting right dominant side: Secondary | ICD-10-CM | POA: Diagnosis not present

## 2023-01-26 DIAGNOSIS — Z741 Need for assistance with personal care: Secondary | ICD-10-CM | POA: Diagnosis not present

## 2023-01-26 DIAGNOSIS — R269 Unspecified abnormalities of gait and mobility: Secondary | ICD-10-CM | POA: Diagnosis not present

## 2023-01-26 DIAGNOSIS — R293 Abnormal posture: Secondary | ICD-10-CM | POA: Diagnosis not present

## 2023-01-26 DIAGNOSIS — I69351 Hemiplegia and hemiparesis following cerebral infarction affecting right dominant side: Secondary | ICD-10-CM | POA: Diagnosis not present

## 2023-01-26 DIAGNOSIS — M6281 Muscle weakness (generalized): Secondary | ICD-10-CM | POA: Diagnosis not present

## 2023-01-27 DIAGNOSIS — I69351 Hemiplegia and hemiparesis following cerebral infarction affecting right dominant side: Secondary | ICD-10-CM | POA: Diagnosis not present

## 2023-01-27 DIAGNOSIS — Z741 Need for assistance with personal care: Secondary | ICD-10-CM | POA: Diagnosis not present

## 2023-01-27 DIAGNOSIS — R293 Abnormal posture: Secondary | ICD-10-CM | POA: Diagnosis not present

## 2023-01-27 DIAGNOSIS — R269 Unspecified abnormalities of gait and mobility: Secondary | ICD-10-CM | POA: Diagnosis not present

## 2023-01-27 DIAGNOSIS — M6281 Muscle weakness (generalized): Secondary | ICD-10-CM | POA: Diagnosis not present

## 2023-01-30 DIAGNOSIS — R293 Abnormal posture: Secondary | ICD-10-CM | POA: Diagnosis not present

## 2023-01-30 DIAGNOSIS — Z741 Need for assistance with personal care: Secondary | ICD-10-CM | POA: Diagnosis not present

## 2023-01-30 DIAGNOSIS — M6281 Muscle weakness (generalized): Secondary | ICD-10-CM | POA: Diagnosis not present

## 2023-01-30 DIAGNOSIS — I69351 Hemiplegia and hemiparesis following cerebral infarction affecting right dominant side: Secondary | ICD-10-CM | POA: Diagnosis not present

## 2023-01-30 DIAGNOSIS — R269 Unspecified abnormalities of gait and mobility: Secondary | ICD-10-CM | POA: Diagnosis not present

## 2023-01-31 DIAGNOSIS — I69951 Hemiplegia and hemiparesis following unspecified cerebrovascular disease affecting right dominant side: Secondary | ICD-10-CM | POA: Diagnosis not present

## 2023-01-31 DIAGNOSIS — Z87891 Personal history of nicotine dependence: Secondary | ICD-10-CM | POA: Diagnosis not present

## 2023-01-31 DIAGNOSIS — Z85818 Personal history of malignant neoplasm of other sites of lip, oral cavity, and pharynx: Secondary | ICD-10-CM | POA: Diagnosis not present

## 2023-01-31 DIAGNOSIS — Z9889 Other specified postprocedural states: Secondary | ICD-10-CM | POA: Diagnosis not present

## 2023-01-31 DIAGNOSIS — R4701 Aphasia: Secondary | ICD-10-CM | POA: Diagnosis not present

## 2023-01-31 DIAGNOSIS — R220 Localized swelling, mass and lump, head: Secondary | ICD-10-CM | POA: Diagnosis not present

## 2023-02-01 DIAGNOSIS — R600 Localized edema: Secondary | ICD-10-CM | POA: Diagnosis not present

## 2023-02-03 DIAGNOSIS — R609 Edema, unspecified: Secondary | ICD-10-CM | POA: Diagnosis not present

## 2023-02-03 DIAGNOSIS — N1831 Chronic kidney disease, stage 3a: Secondary | ICD-10-CM | POA: Diagnosis not present

## 2023-02-03 DIAGNOSIS — M6281 Muscle weakness (generalized): Secondary | ICD-10-CM | POA: Diagnosis not present

## 2023-02-03 DIAGNOSIS — I509 Heart failure, unspecified: Secondary | ICD-10-CM | POA: Diagnosis not present

## 2023-02-03 DIAGNOSIS — E119 Type 2 diabetes mellitus without complications: Secondary | ICD-10-CM | POA: Diagnosis not present

## 2023-02-06 DIAGNOSIS — M1991 Primary osteoarthritis, unspecified site: Secondary | ICD-10-CM | POA: Diagnosis not present

## 2023-02-06 DIAGNOSIS — G629 Polyneuropathy, unspecified: Secondary | ICD-10-CM | POA: Diagnosis not present

## 2023-02-06 DIAGNOSIS — G894 Chronic pain syndrome: Secondary | ICD-10-CM | POA: Diagnosis not present

## 2023-02-09 ENCOUNTER — Encounter: Payer: Self-pay | Admitting: Radiology

## 2023-02-21 ENCOUNTER — Ambulatory Visit: Payer: Medicare Other | Admitting: Internal Medicine

## 2023-02-21 ENCOUNTER — Encounter: Payer: Self-pay | Admitting: Internal Medicine

## 2023-02-28 ENCOUNTER — Ambulatory Visit (HOSPITAL_COMMUNITY)
Admission: RE | Admit: 2023-02-28 | Discharge: 2023-02-28 | Disposition: A | Discharge status: Home / Self Care | Payer: Medicare Other | Source: Ambulatory Visit | Attending: Family Medicine | Admitting: Family Medicine

## 2023-02-28 DIAGNOSIS — R221 Localized swelling, mass and lump, neck: Secondary | ICD-10-CM | POA: Insufficient documentation

## 2023-02-28 LAB — POCT I-STAT CREATININE: Creatinine, Ser: 1.9 mg/dL — ABNORMAL HIGH (ref 0.61–1.24)

## 2023-02-28 MED ORDER — IOHEXOL 300 MG/ML  SOLN
75.0000 mL | Freq: Once | INTRAMUSCULAR | Status: AC | PRN
  Administered 2023-02-28: 75 mL via INTRAVENOUS

## 2023-03-14 DIAGNOSIS — R7309 Other abnormal glucose: Secondary | ICD-10-CM | POA: Diagnosis not present

## 2023-03-21 DIAGNOSIS — I10 Essential (primary) hypertension: Secondary | ICD-10-CM | POA: Diagnosis not present

## 2023-03-21 DIAGNOSIS — E119 Type 2 diabetes mellitus without complications: Secondary | ICD-10-CM | POA: Diagnosis not present

## 2023-03-21 DIAGNOSIS — G894 Chronic pain syndrome: Secondary | ICD-10-CM | POA: Diagnosis not present

## 2023-03-21 DIAGNOSIS — K219 Gastro-esophageal reflux disease without esophagitis: Secondary | ICD-10-CM | POA: Diagnosis not present

## 2023-03-30 DIAGNOSIS — E041 Nontoxic single thyroid nodule: Secondary | ICD-10-CM | POA: Diagnosis not present

## 2023-03-30 DIAGNOSIS — R220 Localized swelling, mass and lump, head: Secondary | ICD-10-CM | POA: Diagnosis not present

## 2023-03-30 DIAGNOSIS — R600 Localized edema: Secondary | ICD-10-CM | POA: Diagnosis not present

## 2023-03-30 DIAGNOSIS — I10 Essential (primary) hypertension: Secondary | ICD-10-CM | POA: Diagnosis not present

## 2023-03-30 DIAGNOSIS — F32A Depression, unspecified: Secondary | ICD-10-CM | POA: Diagnosis not present

## 2023-03-30 DIAGNOSIS — M109 Gout, unspecified: Secondary | ICD-10-CM | POA: Diagnosis not present

## 2023-03-30 DIAGNOSIS — M62838 Other muscle spasm: Secondary | ICD-10-CM | POA: Diagnosis not present

## 2023-03-30 DIAGNOSIS — G629 Polyneuropathy, unspecified: Secondary | ICD-10-CM | POA: Diagnosis not present

## 2023-04-06 DIAGNOSIS — G629 Polyneuropathy, unspecified: Secondary | ICD-10-CM | POA: Diagnosis not present

## 2023-04-06 DIAGNOSIS — G894 Chronic pain syndrome: Secondary | ICD-10-CM | POA: Diagnosis not present

## 2023-04-17 DIAGNOSIS — E210 Primary hyperparathyroidism: Secondary | ICD-10-CM | POA: Diagnosis not present

## 2023-04-17 DIAGNOSIS — D508 Other iron deficiency anemias: Secondary | ICD-10-CM | POA: Diagnosis not present

## 2023-04-19 ENCOUNTER — Telehealth: Payer: Self-pay | Admitting: Internal Medicine

## 2023-04-21 DIAGNOSIS — E1151 Type 2 diabetes mellitus with diabetic peripheral angiopathy without gangrene: Secondary | ICD-10-CM | POA: Diagnosis not present

## 2023-04-21 DIAGNOSIS — L84 Corns and callosities: Secondary | ICD-10-CM | POA: Diagnosis not present

## 2023-04-24 DIAGNOSIS — E119 Type 2 diabetes mellitus without complications: Secondary | ICD-10-CM | POA: Diagnosis not present

## 2023-04-24 DIAGNOSIS — R600 Localized edema: Secondary | ICD-10-CM | POA: Diagnosis not present

## 2023-04-24 DIAGNOSIS — I2510 Atherosclerotic heart disease of native coronary artery without angina pectoris: Secondary | ICD-10-CM | POA: Diagnosis not present

## 2023-04-24 DIAGNOSIS — M109 Gout, unspecified: Secondary | ICD-10-CM | POA: Diagnosis not present

## 2023-04-25 ENCOUNTER — Ambulatory Visit (HOSPITAL_COMMUNITY): Payer: Medicare Other

## 2023-04-25 ENCOUNTER — Other Ambulatory Visit: Payer: Medicare Other

## 2023-04-26 DIAGNOSIS — I69351 Hemiplegia and hemiparesis following cerebral infarction affecting right dominant side: Secondary | ICD-10-CM | POA: Diagnosis not present

## 2023-04-26 DIAGNOSIS — R195 Other fecal abnormalities: Secondary | ICD-10-CM | POA: Diagnosis not present

## 2023-04-26 DIAGNOSIS — D0220 Carcinoma in situ of unspecified bronchus and lung: Secondary | ICD-10-CM | POA: Diagnosis not present

## 2023-04-26 DIAGNOSIS — E119 Type 2 diabetes mellitus without complications: Secondary | ICD-10-CM | POA: Diagnosis not present

## 2023-04-26 DIAGNOSIS — R531 Weakness: Secondary | ICD-10-CM | POA: Diagnosis not present

## 2023-04-26 DIAGNOSIS — D649 Anemia, unspecified: Secondary | ICD-10-CM | POA: Diagnosis not present

## 2023-04-26 DIAGNOSIS — I10 Essential (primary) hypertension: Secondary | ICD-10-CM | POA: Diagnosis not present

## 2023-04-26 DIAGNOSIS — R293 Abnormal posture: Secondary | ICD-10-CM | POA: Diagnosis not present

## 2023-04-26 DIAGNOSIS — M6281 Muscle weakness (generalized): Secondary | ICD-10-CM | POA: Diagnosis not present

## 2023-04-26 DIAGNOSIS — N390 Urinary tract infection, site not specified: Secondary | ICD-10-CM | POA: Diagnosis not present

## 2023-04-26 DIAGNOSIS — I501 Left ventricular failure: Secondary | ICD-10-CM | POA: Diagnosis not present

## 2023-04-27 ENCOUNTER — Ambulatory Visit: Payer: Medicare Other | Admitting: Internal Medicine

## 2023-05-01 DIAGNOSIS — M6281 Muscle weakness (generalized): Secondary | ICD-10-CM | POA: Diagnosis not present

## 2023-05-01 DIAGNOSIS — I69351 Hemiplegia and hemiparesis following cerebral infarction affecting right dominant side: Secondary | ICD-10-CM | POA: Diagnosis not present

## 2023-05-01 DIAGNOSIS — R293 Abnormal posture: Secondary | ICD-10-CM | POA: Diagnosis not present

## 2023-05-01 DIAGNOSIS — R600 Localized edema: Secondary | ICD-10-CM | POA: Diagnosis not present

## 2023-05-01 DIAGNOSIS — H5713 Ocular pain, bilateral: Secondary | ICD-10-CM | POA: Diagnosis not present

## 2023-05-01 DIAGNOSIS — D0220 Carcinoma in situ of unspecified bronchus and lung: Secondary | ICD-10-CM | POA: Diagnosis not present

## 2023-05-01 DIAGNOSIS — E119 Type 2 diabetes mellitus without complications: Secondary | ICD-10-CM | POA: Diagnosis not present

## 2023-05-02 DIAGNOSIS — E119 Type 2 diabetes mellitus without complications: Secondary | ICD-10-CM | POA: Diagnosis not present

## 2023-05-02 DIAGNOSIS — D0220 Carcinoma in situ of unspecified bronchus and lung: Secondary | ICD-10-CM | POA: Diagnosis not present

## 2023-05-02 DIAGNOSIS — M6281 Muscle weakness (generalized): Secondary | ICD-10-CM | POA: Diagnosis not present

## 2023-05-02 DIAGNOSIS — R293 Abnormal posture: Secondary | ICD-10-CM | POA: Diagnosis not present

## 2023-05-02 DIAGNOSIS — I69351 Hemiplegia and hemiparesis following cerebral infarction affecting right dominant side: Secondary | ICD-10-CM | POA: Diagnosis not present

## 2023-05-03 DIAGNOSIS — I69351 Hemiplegia and hemiparesis following cerebral infarction affecting right dominant side: Secondary | ICD-10-CM | POA: Diagnosis not present

## 2023-05-03 DIAGNOSIS — E119 Type 2 diabetes mellitus without complications: Secondary | ICD-10-CM | POA: Diagnosis not present

## 2023-05-03 DIAGNOSIS — M6281 Muscle weakness (generalized): Secondary | ICD-10-CM | POA: Diagnosis not present

## 2023-05-03 DIAGNOSIS — R293 Abnormal posture: Secondary | ICD-10-CM | POA: Diagnosis not present

## 2023-05-03 DIAGNOSIS — D0220 Carcinoma in situ of unspecified bronchus and lung: Secondary | ICD-10-CM | POA: Diagnosis not present

## 2023-05-04 DIAGNOSIS — D0220 Carcinoma in situ of unspecified bronchus and lung: Secondary | ICD-10-CM | POA: Diagnosis not present

## 2023-05-04 DIAGNOSIS — M6281 Muscle weakness (generalized): Secondary | ICD-10-CM | POA: Diagnosis not present

## 2023-05-04 DIAGNOSIS — R293 Abnormal posture: Secondary | ICD-10-CM | POA: Diagnosis not present

## 2023-05-04 DIAGNOSIS — E119 Type 2 diabetes mellitus without complications: Secondary | ICD-10-CM | POA: Diagnosis not present

## 2023-05-04 DIAGNOSIS — I69351 Hemiplegia and hemiparesis following cerebral infarction affecting right dominant side: Secondary | ICD-10-CM | POA: Diagnosis not present

## 2023-05-06 DIAGNOSIS — R293 Abnormal posture: Secondary | ICD-10-CM | POA: Diagnosis not present

## 2023-05-06 DIAGNOSIS — E119 Type 2 diabetes mellitus without complications: Secondary | ICD-10-CM | POA: Diagnosis not present

## 2023-05-06 DIAGNOSIS — I69351 Hemiplegia and hemiparesis following cerebral infarction affecting right dominant side: Secondary | ICD-10-CM | POA: Diagnosis not present

## 2023-05-06 DIAGNOSIS — D0220 Carcinoma in situ of unspecified bronchus and lung: Secondary | ICD-10-CM | POA: Diagnosis not present

## 2023-05-06 DIAGNOSIS — M6281 Muscle weakness (generalized): Secondary | ICD-10-CM | POA: Diagnosis not present

## 2023-05-09 DIAGNOSIS — I10 Essential (primary) hypertension: Secondary | ICD-10-CM | POA: Diagnosis not present

## 2023-05-09 DIAGNOSIS — R293 Abnormal posture: Secondary | ICD-10-CM | POA: Diagnosis not present

## 2023-05-09 DIAGNOSIS — E1122 Type 2 diabetes mellitus with diabetic chronic kidney disease: Secondary | ICD-10-CM | POA: Diagnosis not present

## 2023-05-09 DIAGNOSIS — D638 Anemia in other chronic diseases classified elsewhere: Secondary | ICD-10-CM | POA: Diagnosis not present

## 2023-05-09 DIAGNOSIS — D649 Anemia, unspecified: Secondary | ICD-10-CM | POA: Diagnosis not present

## 2023-05-09 DIAGNOSIS — E1129 Type 2 diabetes mellitus with other diabetic kidney complication: Secondary | ICD-10-CM | POA: Diagnosis not present

## 2023-05-09 DIAGNOSIS — D0220 Carcinoma in situ of unspecified bronchus and lung: Secondary | ICD-10-CM | POA: Diagnosis not present

## 2023-05-09 DIAGNOSIS — G894 Chronic pain syndrome: Secondary | ICD-10-CM | POA: Diagnosis not present

## 2023-05-09 DIAGNOSIS — I69351 Hemiplegia and hemiparesis following cerebral infarction affecting right dominant side: Secondary | ICD-10-CM | POA: Diagnosis not present

## 2023-05-09 DIAGNOSIS — E119 Type 2 diabetes mellitus without complications: Secondary | ICD-10-CM | POA: Diagnosis not present

## 2023-05-09 DIAGNOSIS — R808 Other proteinuria: Secondary | ICD-10-CM | POA: Diagnosis not present

## 2023-05-09 DIAGNOSIS — M6281 Muscle weakness (generalized): Secondary | ICD-10-CM | POA: Diagnosis not present

## 2023-05-10 ENCOUNTER — Inpatient Hospital Stay: Payer: Medicare Other | Attending: Nurse Practitioner

## 2023-05-10 ENCOUNTER — Ambulatory Visit (HOSPITAL_COMMUNITY)
Admission: RE | Admit: 2023-05-10 | Discharge: 2023-05-10 | Disposition: A | Discharge status: Home / Self Care | Payer: Medicare Other | Source: Ambulatory Visit | Attending: Internal Medicine | Admitting: Internal Medicine

## 2023-05-10 DIAGNOSIS — Z85118 Personal history of other malignant neoplasm of bronchus and lung: Secondary | ICD-10-CM | POA: Insufficient documentation

## 2023-05-10 DIAGNOSIS — C3490 Malignant neoplasm of unspecified part of unspecified bronchus or lung: Secondary | ICD-10-CM | POA: Insufficient documentation

## 2023-05-10 DIAGNOSIS — Z8719 Personal history of other diseases of the digestive system: Secondary | ICD-10-CM | POA: Insufficient documentation

## 2023-05-10 DIAGNOSIS — Z8673 Personal history of transient ischemic attack (TIA), and cerebral infarction without residual deficits: Secondary | ICD-10-CM | POA: Insufficient documentation

## 2023-05-10 DIAGNOSIS — Z923 Personal history of irradiation: Secondary | ICD-10-CM | POA: Diagnosis not present

## 2023-05-10 DIAGNOSIS — Z79899 Other long term (current) drug therapy: Secondary | ICD-10-CM | POA: Insufficient documentation

## 2023-05-10 DIAGNOSIS — D472 Monoclonal gammopathy: Secondary | ICD-10-CM | POA: Diagnosis not present

## 2023-05-10 DIAGNOSIS — G894 Chronic pain syndrome: Secondary | ICD-10-CM | POA: Diagnosis not present

## 2023-05-10 DIAGNOSIS — R293 Abnormal posture: Secondary | ICD-10-CM | POA: Diagnosis not present

## 2023-05-10 DIAGNOSIS — J479 Bronchiectasis, uncomplicated: Secondary | ICD-10-CM | POA: Diagnosis not present

## 2023-05-10 DIAGNOSIS — R5383 Other fatigue: Secondary | ICD-10-CM | POA: Insufficient documentation

## 2023-05-10 DIAGNOSIS — J439 Emphysema, unspecified: Secondary | ICD-10-CM | POA: Diagnosis not present

## 2023-05-10 DIAGNOSIS — M6281 Muscle weakness (generalized): Secondary | ICD-10-CM | POA: Diagnosis not present

## 2023-05-10 DIAGNOSIS — E041 Nontoxic single thyroid nodule: Secondary | ICD-10-CM | POA: Diagnosis not present

## 2023-05-10 DIAGNOSIS — I69351 Hemiplegia and hemiparesis following cerebral infarction affecting right dominant side: Secondary | ICD-10-CM | POA: Diagnosis not present

## 2023-05-10 DIAGNOSIS — M1991 Primary osteoarthritis, unspecified site: Secondary | ICD-10-CM | POA: Diagnosis not present

## 2023-05-10 DIAGNOSIS — D0220 Carcinoma in situ of unspecified bronchus and lung: Secondary | ICD-10-CM | POA: Diagnosis not present

## 2023-05-10 DIAGNOSIS — E119 Type 2 diabetes mellitus without complications: Secondary | ICD-10-CM | POA: Diagnosis not present

## 2023-05-10 LAB — CMP (CANCER CENTER ONLY)
ALT: 8 U/L (ref 0–44)
AST: 10 U/L — ABNORMAL LOW (ref 15–41)
Albumin: 3.4 g/dL — ABNORMAL LOW (ref 3.5–5.0)
Alkaline Phosphatase: 109 U/L (ref 38–126)
Anion gap: 7 (ref 5–15)
BUN: 44 mg/dL — ABNORMAL HIGH (ref 8–23)
CO2: 30 mmol/L (ref 22–32)
Calcium: 8.9 mg/dL (ref 8.9–10.3)
Chloride: 103 mmol/L (ref 98–111)
Creatinine: 2.13 mg/dL — ABNORMAL HIGH (ref 0.61–1.24)
GFR, Estimated: 31 mL/min — ABNORMAL LOW (ref >60)
Glucose, Bld: 171 mg/dL — ABNORMAL HIGH (ref 70–99)
Potassium: 4.3 mmol/L (ref 3.5–5.1)
Sodium: 140 mmol/L (ref 135–145)
Total Bilirubin: 0.2 mg/dL — ABNORMAL LOW (ref 0.3–1.2)
Total Protein: 7.5 g/dL (ref 6.5–8.1)

## 2023-05-10 LAB — CBC WITH DIFFERENTIAL (CANCER CENTER ONLY)
Abs Immature Granulocytes: 0.08 10*3/uL — ABNORMAL HIGH (ref 0.00–0.07)
Basophils Absolute: 0 10*3/uL (ref 0.0–0.1)
Basophils Relative: 0 %
Eosinophils Absolute: 0.4 10*3/uL (ref 0.0–0.5)
Eosinophils Relative: 4 %
HCT: 31.4 % — ABNORMAL LOW (ref 39.0–52.0)
Hemoglobin: 10.1 g/dL — ABNORMAL LOW (ref 13.0–17.0)
Immature Granulocytes: 1 %
Lymphocytes Relative: 9 %
Lymphs Abs: 1.1 10*3/uL (ref 0.7–4.0)
MCH: 22.6 pg — ABNORMAL LOW (ref 26.0–34.0)
MCHC: 32.2 g/dL (ref 30.0–36.0)
MCV: 70.2 fL — ABNORMAL LOW (ref 80.0–100.0)
Monocytes Absolute: 0.6 10*3/uL (ref 0.1–1.0)
Monocytes Relative: 5 %
Neutro Abs: 10.1 10*3/uL — ABNORMAL HIGH (ref 1.7–7.7)
Neutrophils Relative %: 81 %
Platelet Count: 342 10*3/uL (ref 150–400)
RBC: 4.47 MIL/uL (ref 4.22–5.81)
RDW: 20.9 % — ABNORMAL HIGH (ref 11.5–15.5)
WBC Count: 12.4 10*3/uL — ABNORMAL HIGH (ref 4.0–10.5)
nRBC: 0 % (ref 0.0–0.2)

## 2023-05-10 LAB — LACTATE DEHYDROGENASE: LDH: 131 U/L (ref 98–192)

## 2023-05-11 LAB — KAPPA/LAMBDA LIGHT CHAINS
Kappa free light chain: 199.9 mg/L — ABNORMAL HIGH (ref 3.3–19.4)
Kappa, lambda light chain ratio: 2.28 — ABNORMAL HIGH (ref 0.26–1.65)
Lambda free light chains: 87.5 mg/L — ABNORMAL HIGH (ref 5.7–26.3)

## 2023-05-11 LAB — BETA 2 MICROGLOBULIN, SERUM: Beta-2 Microglobulin: 7.9 mg/L — ABNORMAL HIGH (ref 0.6–2.4)

## 2023-05-12 DIAGNOSIS — E119 Type 2 diabetes mellitus without complications: Secondary | ICD-10-CM | POA: Diagnosis not present

## 2023-05-12 DIAGNOSIS — M6281 Muscle weakness (generalized): Secondary | ICD-10-CM | POA: Diagnosis not present

## 2023-05-12 DIAGNOSIS — R293 Abnormal posture: Secondary | ICD-10-CM | POA: Diagnosis not present

## 2023-05-12 DIAGNOSIS — I69351 Hemiplegia and hemiparesis following cerebral infarction affecting right dominant side: Secondary | ICD-10-CM | POA: Diagnosis not present

## 2023-05-12 DIAGNOSIS — D0220 Carcinoma in situ of unspecified bronchus and lung: Secondary | ICD-10-CM | POA: Diagnosis not present

## 2023-05-12 LAB — IGG, IGA, IGM
IgA: 345 mg/dL (ref 61–437)
IgG (Immunoglobin G), Serum: 1495 mg/dL (ref 603–1613)
IgM (Immunoglobulin M), Srm: 61 mg/dL (ref 15–143)

## 2023-05-13 DIAGNOSIS — R293 Abnormal posture: Secondary | ICD-10-CM | POA: Diagnosis not present

## 2023-05-13 DIAGNOSIS — E119 Type 2 diabetes mellitus without complications: Secondary | ICD-10-CM | POA: Diagnosis not present

## 2023-05-13 DIAGNOSIS — M6281 Muscle weakness (generalized): Secondary | ICD-10-CM | POA: Diagnosis not present

## 2023-05-13 DIAGNOSIS — I69351 Hemiplegia and hemiparesis following cerebral infarction affecting right dominant side: Secondary | ICD-10-CM | POA: Diagnosis not present

## 2023-05-13 DIAGNOSIS — D0220 Carcinoma in situ of unspecified bronchus and lung: Secondary | ICD-10-CM | POA: Diagnosis not present

## 2023-05-15 DIAGNOSIS — E119 Type 2 diabetes mellitus without complications: Secondary | ICD-10-CM | POA: Diagnosis not present

## 2023-05-15 DIAGNOSIS — D0220 Carcinoma in situ of unspecified bronchus and lung: Secondary | ICD-10-CM | POA: Diagnosis not present

## 2023-05-15 DIAGNOSIS — M6281 Muscle weakness (generalized): Secondary | ICD-10-CM | POA: Diagnosis not present

## 2023-05-15 DIAGNOSIS — R293 Abnormal posture: Secondary | ICD-10-CM | POA: Diagnosis not present

## 2023-05-15 DIAGNOSIS — I69351 Hemiplegia and hemiparesis following cerebral infarction affecting right dominant side: Secondary | ICD-10-CM | POA: Diagnosis not present

## 2023-05-16 ENCOUNTER — Ambulatory Visit: Payer: Medicare Other | Admitting: Internal Medicine

## 2023-05-16 DIAGNOSIS — M6281 Muscle weakness (generalized): Secondary | ICD-10-CM | POA: Diagnosis not present

## 2023-05-16 DIAGNOSIS — R293 Abnormal posture: Secondary | ICD-10-CM | POA: Diagnosis not present

## 2023-05-16 DIAGNOSIS — I69351 Hemiplegia and hemiparesis following cerebral infarction affecting right dominant side: Secondary | ICD-10-CM | POA: Diagnosis not present

## 2023-05-16 DIAGNOSIS — D0220 Carcinoma in situ of unspecified bronchus and lung: Secondary | ICD-10-CM | POA: Diagnosis not present

## 2023-05-16 DIAGNOSIS — E119 Type 2 diabetes mellitus without complications: Secondary | ICD-10-CM | POA: Diagnosis not present

## 2023-05-17 ENCOUNTER — Telehealth: Payer: Self-pay | Admitting: Internal Medicine

## 2023-05-17 ENCOUNTER — Inpatient Hospital Stay: Payer: Medicare Other | Attending: Nurse Practitioner | Admitting: Internal Medicine

## 2023-05-17 DIAGNOSIS — I69351 Hemiplegia and hemiparesis following cerebral infarction affecting right dominant side: Secondary | ICD-10-CM | POA: Diagnosis not present

## 2023-05-17 DIAGNOSIS — M6281 Muscle weakness (generalized): Secondary | ICD-10-CM | POA: Diagnosis not present

## 2023-05-17 DIAGNOSIS — E119 Type 2 diabetes mellitus without complications: Secondary | ICD-10-CM | POA: Diagnosis not present

## 2023-05-17 DIAGNOSIS — C3412 Malignant neoplasm of upper lobe, left bronchus or lung: Secondary | ICD-10-CM

## 2023-05-17 DIAGNOSIS — R293 Abnormal posture: Secondary | ICD-10-CM | POA: Diagnosis not present

## 2023-05-17 DIAGNOSIS — D0220 Carcinoma in situ of unspecified bronchus and lung: Secondary | ICD-10-CM | POA: Diagnosis not present

## 2023-05-17 NOTE — Progress Notes (Signed)
No show

## 2023-05-18 DIAGNOSIS — I69351 Hemiplegia and hemiparesis following cerebral infarction affecting right dominant side: Secondary | ICD-10-CM | POA: Diagnosis not present

## 2023-05-18 DIAGNOSIS — D0220 Carcinoma in situ of unspecified bronchus and lung: Secondary | ICD-10-CM | POA: Diagnosis not present

## 2023-05-18 DIAGNOSIS — R293 Abnormal posture: Secondary | ICD-10-CM | POA: Diagnosis not present

## 2023-05-18 DIAGNOSIS — E119 Type 2 diabetes mellitus without complications: Secondary | ICD-10-CM | POA: Diagnosis not present

## 2023-05-18 DIAGNOSIS — M6281 Muscle weakness (generalized): Secondary | ICD-10-CM | POA: Diagnosis not present

## 2023-05-19 DIAGNOSIS — E119 Type 2 diabetes mellitus without complications: Secondary | ICD-10-CM | POA: Diagnosis not present

## 2023-05-19 DIAGNOSIS — R293 Abnormal posture: Secondary | ICD-10-CM | POA: Diagnosis not present

## 2023-05-19 DIAGNOSIS — D0220 Carcinoma in situ of unspecified bronchus and lung: Secondary | ICD-10-CM | POA: Diagnosis not present

## 2023-05-19 DIAGNOSIS — I69351 Hemiplegia and hemiparesis following cerebral infarction affecting right dominant side: Secondary | ICD-10-CM | POA: Diagnosis not present

## 2023-05-19 DIAGNOSIS — M6281 Muscle weakness (generalized): Secondary | ICD-10-CM | POA: Diagnosis not present

## 2023-05-22 DIAGNOSIS — E782 Mixed hyperlipidemia: Secondary | ICD-10-CM | POA: Diagnosis not present

## 2023-05-22 DIAGNOSIS — G8190 Hemiplegia, unspecified affecting unspecified side: Secondary | ICD-10-CM | POA: Diagnosis not present

## 2023-05-22 DIAGNOSIS — I679 Cerebrovascular disease, unspecified: Secondary | ICD-10-CM | POA: Diagnosis not present

## 2023-05-23 DIAGNOSIS — E119 Type 2 diabetes mellitus without complications: Secondary | ICD-10-CM | POA: Diagnosis not present

## 2023-05-23 DIAGNOSIS — D0220 Carcinoma in situ of unspecified bronchus and lung: Secondary | ICD-10-CM | POA: Diagnosis not present

## 2023-05-23 DIAGNOSIS — M6281 Muscle weakness (generalized): Secondary | ICD-10-CM | POA: Diagnosis not present

## 2023-05-23 DIAGNOSIS — I69351 Hemiplegia and hemiparesis following cerebral infarction affecting right dominant side: Secondary | ICD-10-CM | POA: Diagnosis not present

## 2023-05-23 DIAGNOSIS — R293 Abnormal posture: Secondary | ICD-10-CM | POA: Diagnosis not present

## 2023-05-25 DIAGNOSIS — R293 Abnormal posture: Secondary | ICD-10-CM | POA: Diagnosis not present

## 2023-05-25 DIAGNOSIS — I69351 Hemiplegia and hemiparesis following cerebral infarction affecting right dominant side: Secondary | ICD-10-CM | POA: Diagnosis not present

## 2023-05-25 DIAGNOSIS — D0220 Carcinoma in situ of unspecified bronchus and lung: Secondary | ICD-10-CM | POA: Diagnosis not present

## 2023-05-25 DIAGNOSIS — M6281 Muscle weakness (generalized): Secondary | ICD-10-CM | POA: Diagnosis not present

## 2023-05-25 DIAGNOSIS — E119 Type 2 diabetes mellitus without complications: Secondary | ICD-10-CM | POA: Diagnosis not present

## 2023-05-26 DIAGNOSIS — D0220 Carcinoma in situ of unspecified bronchus and lung: Secondary | ICD-10-CM | POA: Diagnosis not present

## 2023-05-26 DIAGNOSIS — I69351 Hemiplegia and hemiparesis following cerebral infarction affecting right dominant side: Secondary | ICD-10-CM | POA: Diagnosis not present

## 2023-05-26 DIAGNOSIS — M6281 Muscle weakness (generalized): Secondary | ICD-10-CM | POA: Diagnosis not present

## 2023-05-26 DIAGNOSIS — R293 Abnormal posture: Secondary | ICD-10-CM | POA: Diagnosis not present

## 2023-05-26 DIAGNOSIS — E119 Type 2 diabetes mellitus without complications: Secondary | ICD-10-CM | POA: Diagnosis not present

## 2023-05-30 DIAGNOSIS — R293 Abnormal posture: Secondary | ICD-10-CM | POA: Diagnosis not present

## 2023-05-30 DIAGNOSIS — I69351 Hemiplegia and hemiparesis following cerebral infarction affecting right dominant side: Secondary | ICD-10-CM | POA: Diagnosis not present

## 2023-05-30 DIAGNOSIS — E119 Type 2 diabetes mellitus without complications: Secondary | ICD-10-CM | POA: Diagnosis not present

## 2023-05-30 DIAGNOSIS — D0220 Carcinoma in situ of unspecified bronchus and lung: Secondary | ICD-10-CM | POA: Diagnosis not present

## 2023-05-30 DIAGNOSIS — M6281 Muscle weakness (generalized): Secondary | ICD-10-CM | POA: Diagnosis not present

## 2023-05-31 DIAGNOSIS — M6281 Muscle weakness (generalized): Secondary | ICD-10-CM | POA: Diagnosis not present

## 2023-05-31 DIAGNOSIS — E119 Type 2 diabetes mellitus without complications: Secondary | ICD-10-CM | POA: Diagnosis not present

## 2023-05-31 DIAGNOSIS — D0220 Carcinoma in situ of unspecified bronchus and lung: Secondary | ICD-10-CM | POA: Diagnosis not present

## 2023-05-31 DIAGNOSIS — R293 Abnormal posture: Secondary | ICD-10-CM | POA: Diagnosis not present

## 2023-05-31 DIAGNOSIS — I69351 Hemiplegia and hemiparesis following cerebral infarction affecting right dominant side: Secondary | ICD-10-CM | POA: Diagnosis not present

## 2023-06-02 DIAGNOSIS — I69351 Hemiplegia and hemiparesis following cerebral infarction affecting right dominant side: Secondary | ICD-10-CM | POA: Diagnosis not present

## 2023-06-02 DIAGNOSIS — E119 Type 2 diabetes mellitus without complications: Secondary | ICD-10-CM | POA: Diagnosis not present

## 2023-06-02 DIAGNOSIS — M6281 Muscle weakness (generalized): Secondary | ICD-10-CM | POA: Diagnosis not present

## 2023-06-02 DIAGNOSIS — D0220 Carcinoma in situ of unspecified bronchus and lung: Secondary | ICD-10-CM | POA: Diagnosis not present

## 2023-06-02 DIAGNOSIS — R293 Abnormal posture: Secondary | ICD-10-CM | POA: Diagnosis not present

## 2023-06-06 DIAGNOSIS — D0220 Carcinoma in situ of unspecified bronchus and lung: Secondary | ICD-10-CM | POA: Diagnosis not present

## 2023-06-06 DIAGNOSIS — M6281 Muscle weakness (generalized): Secondary | ICD-10-CM | POA: Diagnosis not present

## 2023-06-06 DIAGNOSIS — K5900 Constipation, unspecified: Secondary | ICD-10-CM | POA: Diagnosis not present

## 2023-06-06 DIAGNOSIS — R0989 Other specified symptoms and signs involving the circulatory and respiratory systems: Secondary | ICD-10-CM | POA: Diagnosis not present

## 2023-06-06 DIAGNOSIS — I69351 Hemiplegia and hemiparesis following cerebral infarction affecting right dominant side: Secondary | ICD-10-CM | POA: Diagnosis not present

## 2023-06-06 DIAGNOSIS — E119 Type 2 diabetes mellitus without complications: Secondary | ICD-10-CM | POA: Diagnosis not present

## 2023-06-06 DIAGNOSIS — R051 Acute cough: Secondary | ICD-10-CM | POA: Diagnosis not present

## 2023-06-06 DIAGNOSIS — K219 Gastro-esophageal reflux disease without esophagitis: Secondary | ICD-10-CM | POA: Diagnosis not present

## 2023-06-06 DIAGNOSIS — H1033 Unspecified acute conjunctivitis, bilateral: Secondary | ICD-10-CM | POA: Diagnosis not present

## 2023-06-06 DIAGNOSIS — I10 Essential (primary) hypertension: Secondary | ICD-10-CM | POA: Diagnosis not present

## 2023-06-06 DIAGNOSIS — R293 Abnormal posture: Secondary | ICD-10-CM | POA: Diagnosis not present

## 2023-06-07 DIAGNOSIS — D0220 Carcinoma in situ of unspecified bronchus and lung: Secondary | ICD-10-CM | POA: Diagnosis not present

## 2023-06-07 DIAGNOSIS — M6281 Muscle weakness (generalized): Secondary | ICD-10-CM | POA: Diagnosis not present

## 2023-06-07 DIAGNOSIS — I69351 Hemiplegia and hemiparesis following cerebral infarction affecting right dominant side: Secondary | ICD-10-CM | POA: Diagnosis not present

## 2023-06-07 DIAGNOSIS — E119 Type 2 diabetes mellitus without complications: Secondary | ICD-10-CM | POA: Diagnosis not present

## 2023-06-07 DIAGNOSIS — R293 Abnormal posture: Secondary | ICD-10-CM | POA: Diagnosis not present

## 2023-06-09 DIAGNOSIS — I69351 Hemiplegia and hemiparesis following cerebral infarction affecting right dominant side: Secondary | ICD-10-CM | POA: Diagnosis not present

## 2023-06-09 DIAGNOSIS — D0220 Carcinoma in situ of unspecified bronchus and lung: Secondary | ICD-10-CM | POA: Diagnosis not present

## 2023-06-09 DIAGNOSIS — M1991 Primary osteoarthritis, unspecified site: Secondary | ICD-10-CM | POA: Diagnosis not present

## 2023-06-09 DIAGNOSIS — G629 Polyneuropathy, unspecified: Secondary | ICD-10-CM | POA: Diagnosis not present

## 2023-06-09 DIAGNOSIS — M6281 Muscle weakness (generalized): Secondary | ICD-10-CM | POA: Diagnosis not present

## 2023-06-09 DIAGNOSIS — R293 Abnormal posture: Secondary | ICD-10-CM | POA: Diagnosis not present

## 2023-06-09 DIAGNOSIS — E119 Type 2 diabetes mellitus without complications: Secondary | ICD-10-CM | POA: Diagnosis not present

## 2023-06-09 DIAGNOSIS — G894 Chronic pain syndrome: Secondary | ICD-10-CM | POA: Diagnosis not present

## 2023-06-12 DIAGNOSIS — D0220 Carcinoma in situ of unspecified bronchus and lung: Secondary | ICD-10-CM | POA: Diagnosis not present

## 2023-06-12 DIAGNOSIS — M6281 Muscle weakness (generalized): Secondary | ICD-10-CM | POA: Diagnosis not present

## 2023-06-12 DIAGNOSIS — R293 Abnormal posture: Secondary | ICD-10-CM | POA: Diagnosis not present

## 2023-06-12 DIAGNOSIS — I69351 Hemiplegia and hemiparesis following cerebral infarction affecting right dominant side: Secondary | ICD-10-CM | POA: Diagnosis not present

## 2023-06-12 DIAGNOSIS — E119 Type 2 diabetes mellitus without complications: Secondary | ICD-10-CM | POA: Diagnosis not present

## 2023-06-14 DIAGNOSIS — I69351 Hemiplegia and hemiparesis following cerebral infarction affecting right dominant side: Secondary | ICD-10-CM | POA: Diagnosis not present

## 2023-06-14 DIAGNOSIS — R293 Abnormal posture: Secondary | ICD-10-CM | POA: Diagnosis not present

## 2023-06-14 DIAGNOSIS — M109 Gout, unspecified: Secondary | ICD-10-CM | POA: Diagnosis not present

## 2023-06-14 DIAGNOSIS — I2510 Atherosclerotic heart disease of native coronary artery without angina pectoris: Secondary | ICD-10-CM | POA: Diagnosis not present

## 2023-06-14 DIAGNOSIS — R600 Localized edema: Secondary | ICD-10-CM | POA: Diagnosis not present

## 2023-06-14 DIAGNOSIS — D0220 Carcinoma in situ of unspecified bronchus and lung: Secondary | ICD-10-CM | POA: Diagnosis not present

## 2023-06-14 DIAGNOSIS — M6281 Muscle weakness (generalized): Secondary | ICD-10-CM | POA: Diagnosis not present

## 2023-06-14 DIAGNOSIS — E119 Type 2 diabetes mellitus without complications: Secondary | ICD-10-CM | POA: Diagnosis not present

## 2023-06-16 DIAGNOSIS — M6281 Muscle weakness (generalized): Secondary | ICD-10-CM | POA: Diagnosis not present

## 2023-06-16 DIAGNOSIS — E119 Type 2 diabetes mellitus without complications: Secondary | ICD-10-CM | POA: Diagnosis not present

## 2023-06-16 DIAGNOSIS — I69351 Hemiplegia and hemiparesis following cerebral infarction affecting right dominant side: Secondary | ICD-10-CM | POA: Diagnosis not present

## 2023-06-16 DIAGNOSIS — D0220 Carcinoma in situ of unspecified bronchus and lung: Secondary | ICD-10-CM | POA: Diagnosis not present

## 2023-06-16 DIAGNOSIS — R293 Abnormal posture: Secondary | ICD-10-CM | POA: Diagnosis not present

## 2023-06-19 DIAGNOSIS — E119 Type 2 diabetes mellitus without complications: Secondary | ICD-10-CM | POA: Diagnosis not present

## 2023-06-19 DIAGNOSIS — I2510 Atherosclerotic heart disease of native coronary artery without angina pectoris: Secondary | ICD-10-CM | POA: Diagnosis not present

## 2023-06-19 DIAGNOSIS — K5900 Constipation, unspecified: Secondary | ICD-10-CM | POA: Diagnosis not present

## 2023-06-19 DIAGNOSIS — K219 Gastro-esophageal reflux disease without esophagitis: Secondary | ICD-10-CM | POA: Diagnosis not present

## 2023-06-19 DIAGNOSIS — G894 Chronic pain syndrome: Secondary | ICD-10-CM | POA: Diagnosis not present

## 2023-06-19 DIAGNOSIS — M1991 Primary osteoarthritis, unspecified site: Secondary | ICD-10-CM | POA: Diagnosis not present

## 2023-06-19 DIAGNOSIS — M109 Gout, unspecified: Secondary | ICD-10-CM | POA: Diagnosis not present

## 2023-06-20 DIAGNOSIS — E119 Type 2 diabetes mellitus without complications: Secondary | ICD-10-CM | POA: Diagnosis not present

## 2023-06-20 DIAGNOSIS — D0220 Carcinoma in situ of unspecified bronchus and lung: Secondary | ICD-10-CM | POA: Diagnosis not present

## 2023-06-20 DIAGNOSIS — R293 Abnormal posture: Secondary | ICD-10-CM | POA: Diagnosis not present

## 2023-06-20 DIAGNOSIS — I69351 Hemiplegia and hemiparesis following cerebral infarction affecting right dominant side: Secondary | ICD-10-CM | POA: Diagnosis not present

## 2023-06-20 DIAGNOSIS — M6281 Muscle weakness (generalized): Secondary | ICD-10-CM | POA: Diagnosis not present

## 2023-06-22 DIAGNOSIS — R293 Abnormal posture: Secondary | ICD-10-CM | POA: Diagnosis not present

## 2023-06-22 DIAGNOSIS — I69351 Hemiplegia and hemiparesis following cerebral infarction affecting right dominant side: Secondary | ICD-10-CM | POA: Diagnosis not present

## 2023-06-22 DIAGNOSIS — M6281 Muscle weakness (generalized): Secondary | ICD-10-CM | POA: Diagnosis not present

## 2023-06-22 DIAGNOSIS — E119 Type 2 diabetes mellitus without complications: Secondary | ICD-10-CM | POA: Diagnosis not present

## 2023-06-22 DIAGNOSIS — D0220 Carcinoma in situ of unspecified bronchus and lung: Secondary | ICD-10-CM | POA: Diagnosis not present

## 2023-06-23 DIAGNOSIS — D0220 Carcinoma in situ of unspecified bronchus and lung: Secondary | ICD-10-CM | POA: Diagnosis not present

## 2023-06-23 DIAGNOSIS — R293 Abnormal posture: Secondary | ICD-10-CM | POA: Diagnosis not present

## 2023-06-23 DIAGNOSIS — I69351 Hemiplegia and hemiparesis following cerebral infarction affecting right dominant side: Secondary | ICD-10-CM | POA: Diagnosis not present

## 2023-06-23 DIAGNOSIS — M6281 Muscle weakness (generalized): Secondary | ICD-10-CM | POA: Diagnosis not present

## 2023-06-23 DIAGNOSIS — E119 Type 2 diabetes mellitus without complications: Secondary | ICD-10-CM | POA: Diagnosis not present

## 2023-06-29 DIAGNOSIS — R293 Abnormal posture: Secondary | ICD-10-CM | POA: Diagnosis not present

## 2023-06-29 DIAGNOSIS — E119 Type 2 diabetes mellitus without complications: Secondary | ICD-10-CM | POA: Diagnosis not present

## 2023-06-29 DIAGNOSIS — D0220 Carcinoma in situ of unspecified bronchus and lung: Secondary | ICD-10-CM | POA: Diagnosis not present

## 2023-06-29 DIAGNOSIS — M6281 Muscle weakness (generalized): Secondary | ICD-10-CM | POA: Diagnosis not present

## 2023-06-29 DIAGNOSIS — I69351 Hemiplegia and hemiparesis following cerebral infarction affecting right dominant side: Secondary | ICD-10-CM | POA: Diagnosis not present

## 2023-06-30 DIAGNOSIS — E119 Type 2 diabetes mellitus without complications: Secondary | ICD-10-CM | POA: Diagnosis not present

## 2023-06-30 DIAGNOSIS — R293 Abnormal posture: Secondary | ICD-10-CM | POA: Diagnosis not present

## 2023-06-30 DIAGNOSIS — I69351 Hemiplegia and hemiparesis following cerebral infarction affecting right dominant side: Secondary | ICD-10-CM | POA: Diagnosis not present

## 2023-06-30 DIAGNOSIS — M6281 Muscle weakness (generalized): Secondary | ICD-10-CM | POA: Diagnosis not present

## 2023-06-30 DIAGNOSIS — D0220 Carcinoma in situ of unspecified bronchus and lung: Secondary | ICD-10-CM | POA: Diagnosis not present

## 2023-07-01 DIAGNOSIS — D0220 Carcinoma in situ of unspecified bronchus and lung: Secondary | ICD-10-CM | POA: Diagnosis not present

## 2023-07-01 DIAGNOSIS — I69351 Hemiplegia and hemiparesis following cerebral infarction affecting right dominant side: Secondary | ICD-10-CM | POA: Diagnosis not present

## 2023-07-01 DIAGNOSIS — E119 Type 2 diabetes mellitus without complications: Secondary | ICD-10-CM | POA: Diagnosis not present

## 2023-07-01 DIAGNOSIS — R293 Abnormal posture: Secondary | ICD-10-CM | POA: Diagnosis not present

## 2023-07-01 DIAGNOSIS — M6281 Muscle weakness (generalized): Secondary | ICD-10-CM | POA: Diagnosis not present

## 2023-07-03 DIAGNOSIS — D0220 Carcinoma in situ of unspecified bronchus and lung: Secondary | ICD-10-CM | POA: Diagnosis not present

## 2023-07-03 DIAGNOSIS — M6281 Muscle weakness (generalized): Secondary | ICD-10-CM | POA: Diagnosis not present

## 2023-07-03 DIAGNOSIS — R293 Abnormal posture: Secondary | ICD-10-CM | POA: Diagnosis not present

## 2023-07-03 DIAGNOSIS — I69351 Hemiplegia and hemiparesis following cerebral infarction affecting right dominant side: Secondary | ICD-10-CM | POA: Diagnosis not present

## 2023-07-03 DIAGNOSIS — E119 Type 2 diabetes mellitus without complications: Secondary | ICD-10-CM | POA: Diagnosis not present

## 2023-07-04 DIAGNOSIS — I69351 Hemiplegia and hemiparesis following cerebral infarction affecting right dominant side: Secondary | ICD-10-CM | POA: Diagnosis not present

## 2023-07-04 DIAGNOSIS — R293 Abnormal posture: Secondary | ICD-10-CM | POA: Diagnosis not present

## 2023-07-04 DIAGNOSIS — E119 Type 2 diabetes mellitus without complications: Secondary | ICD-10-CM | POA: Diagnosis not present

## 2023-07-04 DIAGNOSIS — D0220 Carcinoma in situ of unspecified bronchus and lung: Secondary | ICD-10-CM | POA: Diagnosis not present

## 2023-07-04 DIAGNOSIS — M6281 Muscle weakness (generalized): Secondary | ICD-10-CM | POA: Diagnosis not present

## 2023-07-05 DIAGNOSIS — L84 Corns and callosities: Secondary | ICD-10-CM | POA: Diagnosis not present

## 2023-07-05 DIAGNOSIS — E1151 Type 2 diabetes mellitus with diabetic peripheral angiopathy without gangrene: Secondary | ICD-10-CM | POA: Diagnosis not present

## 2023-07-06 DIAGNOSIS — G894 Chronic pain syndrome: Secondary | ICD-10-CM | POA: Diagnosis not present

## 2023-07-06 DIAGNOSIS — E119 Type 2 diabetes mellitus without complications: Secondary | ICD-10-CM | POA: Diagnosis not present

## 2023-07-06 DIAGNOSIS — M1991 Primary osteoarthritis, unspecified site: Secondary | ICD-10-CM | POA: Diagnosis not present

## 2023-07-06 DIAGNOSIS — M109 Gout, unspecified: Secondary | ICD-10-CM | POA: Diagnosis not present

## 2023-07-06 DIAGNOSIS — L739 Follicular disorder, unspecified: Secondary | ICD-10-CM | POA: Diagnosis not present

## 2023-07-06 DIAGNOSIS — K219 Gastro-esophageal reflux disease without esophagitis: Secondary | ICD-10-CM | POA: Diagnosis not present

## 2023-07-06 DIAGNOSIS — I2510 Atherosclerotic heart disease of native coronary artery without angina pectoris: Secondary | ICD-10-CM | POA: Diagnosis not present

## 2023-07-07 DIAGNOSIS — G894 Chronic pain syndrome: Secondary | ICD-10-CM | POA: Diagnosis not present

## 2023-07-07 DIAGNOSIS — M1991 Primary osteoarthritis, unspecified site: Secondary | ICD-10-CM | POA: Diagnosis not present

## 2023-07-07 DIAGNOSIS — I509 Heart failure, unspecified: Secondary | ICD-10-CM | POA: Diagnosis not present

## 2023-07-07 DIAGNOSIS — R799 Abnormal finding of blood chemistry, unspecified: Secondary | ICD-10-CM | POA: Diagnosis not present

## 2023-07-07 DIAGNOSIS — E790 Hyperuricemia without signs of inflammatory arthritis and tophaceous disease: Secondary | ICD-10-CM | POA: Diagnosis not present

## 2023-07-07 DIAGNOSIS — Z130 Encounter for screening for diseases of the blood and blood-forming organs and certain disorders involving the immune mechanism: Secondary | ICD-10-CM | POA: Diagnosis not present

## 2023-07-13 DIAGNOSIS — E790 Hyperuricemia without signs of inflammatory arthritis and tophaceous disease: Secondary | ICD-10-CM | POA: Diagnosis not present

## 2023-07-13 DIAGNOSIS — L739 Follicular disorder, unspecified: Secondary | ICD-10-CM | POA: Diagnosis not present

## 2023-07-13 DIAGNOSIS — I509 Heart failure, unspecified: Secondary | ICD-10-CM | POA: Diagnosis not present

## 2023-07-24 DIAGNOSIS — I2510 Atherosclerotic heart disease of native coronary artery without angina pectoris: Secondary | ICD-10-CM | POA: Diagnosis not present

## 2023-07-24 DIAGNOSIS — N183 Chronic kidney disease, stage 3 unspecified: Secondary | ICD-10-CM | POA: Diagnosis not present

## 2023-07-24 DIAGNOSIS — I509 Heart failure, unspecified: Secondary | ICD-10-CM | POA: Diagnosis not present

## 2023-07-24 DIAGNOSIS — M109 Gout, unspecified: Secondary | ICD-10-CM | POA: Diagnosis not present

## 2023-08-07 DIAGNOSIS — G8190 Hemiplegia, unspecified affecting unspecified side: Secondary | ICD-10-CM | POA: Diagnosis not present

## 2023-08-07 DIAGNOSIS — R109 Unspecified abdominal pain: Secondary | ICD-10-CM | POA: Diagnosis not present

## 2023-08-07 DIAGNOSIS — E119 Type 2 diabetes mellitus without complications: Secondary | ICD-10-CM | POA: Diagnosis not present

## 2023-08-07 DIAGNOSIS — I679 Cerebrovascular disease, unspecified: Secondary | ICD-10-CM | POA: Diagnosis not present

## 2023-08-09 DIAGNOSIS — G894 Chronic pain syndrome: Secondary | ICD-10-CM | POA: Diagnosis not present

## 2023-08-09 DIAGNOSIS — M1991 Primary osteoarthritis, unspecified site: Secondary | ICD-10-CM | POA: Diagnosis not present

## 2023-08-09 DIAGNOSIS — M62838 Other muscle spasm: Secondary | ICD-10-CM | POA: Diagnosis not present

## 2023-08-18 DIAGNOSIS — R058 Other specified cough: Secondary | ICD-10-CM | POA: Diagnosis not present

## 2023-08-18 DIAGNOSIS — R0781 Pleurodynia: Secondary | ICD-10-CM | POA: Diagnosis not present

## 2023-08-19 DIAGNOSIS — R059 Cough, unspecified: Secondary | ICD-10-CM | POA: Diagnosis not present

## 2023-08-21 DIAGNOSIS — J069 Acute upper respiratory infection, unspecified: Secondary | ICD-10-CM | POA: Diagnosis not present

## 2023-08-21 DIAGNOSIS — R0781 Pleurodynia: Secondary | ICD-10-CM | POA: Diagnosis not present

## 2023-08-21 DIAGNOSIS — R058 Other specified cough: Secondary | ICD-10-CM | POA: Diagnosis not present

## 2023-08-23 DIAGNOSIS — R0781 Pleurodynia: Secondary | ICD-10-CM | POA: Diagnosis not present

## 2023-08-23 DIAGNOSIS — R058 Other specified cough: Secondary | ICD-10-CM | POA: Diagnosis not present

## 2023-08-30 DIAGNOSIS — I10 Essential (primary) hypertension: Secondary | ICD-10-CM | POA: Diagnosis not present

## 2023-08-30 DIAGNOSIS — N19 Unspecified kidney failure: Secondary | ICD-10-CM | POA: Diagnosis not present

## 2023-09-06 DIAGNOSIS — M1991 Primary osteoarthritis, unspecified site: Secondary | ICD-10-CM | POA: Diagnosis not present

## 2023-09-06 DIAGNOSIS — G894 Chronic pain syndrome: Secondary | ICD-10-CM | POA: Diagnosis not present

## 2023-09-07 DIAGNOSIS — M6281 Muscle weakness (generalized): Secondary | ICD-10-CM | POA: Diagnosis not present

## 2023-09-07 DIAGNOSIS — R279 Unspecified lack of coordination: Secondary | ICD-10-CM | POA: Diagnosis not present

## 2023-09-07 DIAGNOSIS — I890 Lymphedema, not elsewhere classified: Secondary | ICD-10-CM | POA: Diagnosis not present

## 2023-09-07 DIAGNOSIS — M2450 Contracture, unspecified joint: Secondary | ICD-10-CM | POA: Diagnosis not present

## 2023-09-07 DIAGNOSIS — M79602 Pain in left arm: Secondary | ICD-10-CM | POA: Diagnosis not present

## 2023-09-07 DIAGNOSIS — D0220 Carcinoma in situ of unspecified bronchus and lung: Secondary | ICD-10-CM | POA: Diagnosis not present

## 2023-09-07 DIAGNOSIS — I69351 Hemiplegia and hemiparesis following cerebral infarction affecting right dominant side: Secondary | ICD-10-CM | POA: Diagnosis not present

## 2023-09-08 DIAGNOSIS — G894 Chronic pain syndrome: Secondary | ICD-10-CM | POA: Diagnosis not present

## 2023-09-08 DIAGNOSIS — I69351 Hemiplegia and hemiparesis following cerebral infarction affecting right dominant side: Secondary | ICD-10-CM | POA: Diagnosis not present

## 2023-09-08 DIAGNOSIS — G629 Polyneuropathy, unspecified: Secondary | ICD-10-CM | POA: Diagnosis not present

## 2023-09-08 DIAGNOSIS — D0220 Carcinoma in situ of unspecified bronchus and lung: Secondary | ICD-10-CM | POA: Diagnosis not present

## 2023-09-08 DIAGNOSIS — K219 Gastro-esophageal reflux disease without esophagitis: Secondary | ICD-10-CM | POA: Diagnosis not present

## 2023-09-08 DIAGNOSIS — M6281 Muscle weakness (generalized): Secondary | ICD-10-CM | POA: Diagnosis not present

## 2023-09-08 DIAGNOSIS — E782 Mixed hyperlipidemia: Secondary | ICD-10-CM | POA: Diagnosis not present

## 2023-09-08 DIAGNOSIS — I890 Lymphedema, not elsewhere classified: Secondary | ICD-10-CM | POA: Diagnosis not present

## 2023-09-08 DIAGNOSIS — M79602 Pain in left arm: Secondary | ICD-10-CM | POA: Diagnosis not present

## 2023-09-08 DIAGNOSIS — D509 Iron deficiency anemia, unspecified: Secondary | ICD-10-CM | POA: Diagnosis not present

## 2023-09-08 DIAGNOSIS — I10 Essential (primary) hypertension: Secondary | ICD-10-CM | POA: Diagnosis not present

## 2023-09-08 DIAGNOSIS — R279 Unspecified lack of coordination: Secondary | ICD-10-CM | POA: Diagnosis not present

## 2023-09-08 DIAGNOSIS — M2450 Contracture, unspecified joint: Secondary | ICD-10-CM | POA: Diagnosis not present

## 2023-09-09 DIAGNOSIS — G4700 Insomnia, unspecified: Secondary | ICD-10-CM | POA: Diagnosis not present

## 2023-09-11 DIAGNOSIS — M2450 Contracture, unspecified joint: Secondary | ICD-10-CM | POA: Diagnosis not present

## 2023-09-11 DIAGNOSIS — N1832 Chronic kidney disease, stage 3b: Secondary | ICD-10-CM | POA: Diagnosis not present

## 2023-09-11 DIAGNOSIS — D472 Monoclonal gammopathy: Secondary | ICD-10-CM | POA: Diagnosis not present

## 2023-09-11 DIAGNOSIS — I890 Lymphedema, not elsewhere classified: Secondary | ICD-10-CM | POA: Diagnosis not present

## 2023-09-11 DIAGNOSIS — R279 Unspecified lack of coordination: Secondary | ICD-10-CM | POA: Diagnosis not present

## 2023-09-11 DIAGNOSIS — I69351 Hemiplegia and hemiparesis following cerebral infarction affecting right dominant side: Secondary | ICD-10-CM | POA: Diagnosis not present

## 2023-09-11 DIAGNOSIS — D0220 Carcinoma in situ of unspecified bronchus and lung: Secondary | ICD-10-CM | POA: Diagnosis not present

## 2023-09-11 DIAGNOSIS — M109 Gout, unspecified: Secondary | ICD-10-CM | POA: Diagnosis not present

## 2023-09-11 DIAGNOSIS — M6281 Muscle weakness (generalized): Secondary | ICD-10-CM | POA: Diagnosis not present

## 2023-09-11 DIAGNOSIS — E1122 Type 2 diabetes mellitus with diabetic chronic kidney disease: Secondary | ICD-10-CM | POA: Diagnosis not present

## 2023-09-11 DIAGNOSIS — M79602 Pain in left arm: Secondary | ICD-10-CM | POA: Diagnosis not present

## 2023-09-12 DIAGNOSIS — R279 Unspecified lack of coordination: Secondary | ICD-10-CM | POA: Diagnosis not present

## 2023-09-12 DIAGNOSIS — D0220 Carcinoma in situ of unspecified bronchus and lung: Secondary | ICD-10-CM | POA: Diagnosis not present

## 2023-09-12 DIAGNOSIS — M79602 Pain in left arm: Secondary | ICD-10-CM | POA: Diagnosis not present

## 2023-09-12 DIAGNOSIS — M6281 Muscle weakness (generalized): Secondary | ICD-10-CM | POA: Diagnosis not present

## 2023-09-12 DIAGNOSIS — I890 Lymphedema, not elsewhere classified: Secondary | ICD-10-CM | POA: Diagnosis not present

## 2023-09-12 DIAGNOSIS — I69351 Hemiplegia and hemiparesis following cerebral infarction affecting right dominant side: Secondary | ICD-10-CM | POA: Diagnosis not present

## 2023-09-12 DIAGNOSIS — M2450 Contracture, unspecified joint: Secondary | ICD-10-CM | POA: Diagnosis not present

## 2023-09-13 DIAGNOSIS — M79602 Pain in left arm: Secondary | ICD-10-CM | POA: Diagnosis not present

## 2023-09-13 DIAGNOSIS — M2450 Contracture, unspecified joint: Secondary | ICD-10-CM | POA: Diagnosis not present

## 2023-09-13 DIAGNOSIS — I69351 Hemiplegia and hemiparesis following cerebral infarction affecting right dominant side: Secondary | ICD-10-CM | POA: Diagnosis not present

## 2023-09-13 DIAGNOSIS — I890 Lymphedema, not elsewhere classified: Secondary | ICD-10-CM | POA: Diagnosis not present

## 2023-09-13 DIAGNOSIS — R279 Unspecified lack of coordination: Secondary | ICD-10-CM | POA: Diagnosis not present

## 2023-09-13 DIAGNOSIS — M6281 Muscle weakness (generalized): Secondary | ICD-10-CM | POA: Diagnosis not present

## 2023-09-13 DIAGNOSIS — D0220 Carcinoma in situ of unspecified bronchus and lung: Secondary | ICD-10-CM | POA: Diagnosis not present

## 2023-09-14 DIAGNOSIS — I890 Lymphedema, not elsewhere classified: Secondary | ICD-10-CM | POA: Diagnosis not present

## 2023-09-14 DIAGNOSIS — M2450 Contracture, unspecified joint: Secondary | ICD-10-CM | POA: Diagnosis not present

## 2023-09-14 DIAGNOSIS — I69351 Hemiplegia and hemiparesis following cerebral infarction affecting right dominant side: Secondary | ICD-10-CM | POA: Diagnosis not present

## 2023-09-14 DIAGNOSIS — M6281 Muscle weakness (generalized): Secondary | ICD-10-CM | POA: Diagnosis not present

## 2023-09-14 DIAGNOSIS — M79602 Pain in left arm: Secondary | ICD-10-CM | POA: Diagnosis not present

## 2023-09-14 DIAGNOSIS — D0220 Carcinoma in situ of unspecified bronchus and lung: Secondary | ICD-10-CM | POA: Diagnosis not present

## 2023-09-14 DIAGNOSIS — R279 Unspecified lack of coordination: Secondary | ICD-10-CM | POA: Diagnosis not present

## 2023-09-15 DIAGNOSIS — I890 Lymphedema, not elsewhere classified: Secondary | ICD-10-CM | POA: Diagnosis not present

## 2023-09-15 DIAGNOSIS — R279 Unspecified lack of coordination: Secondary | ICD-10-CM | POA: Diagnosis not present

## 2023-09-15 DIAGNOSIS — M6281 Muscle weakness (generalized): Secondary | ICD-10-CM | POA: Diagnosis not present

## 2023-09-15 DIAGNOSIS — D0220 Carcinoma in situ of unspecified bronchus and lung: Secondary | ICD-10-CM | POA: Diagnosis not present

## 2023-09-15 DIAGNOSIS — M2450 Contracture, unspecified joint: Secondary | ICD-10-CM | POA: Diagnosis not present

## 2023-09-15 DIAGNOSIS — I69351 Hemiplegia and hemiparesis following cerebral infarction affecting right dominant side: Secondary | ICD-10-CM | POA: Diagnosis not present

## 2023-09-15 DIAGNOSIS — M79602 Pain in left arm: Secondary | ICD-10-CM | POA: Diagnosis not present

## 2023-09-18 ENCOUNTER — Other Ambulatory Visit: Payer: Self-pay

## 2023-09-18 DIAGNOSIS — M79602 Pain in left arm: Secondary | ICD-10-CM | POA: Diagnosis not present

## 2023-09-18 DIAGNOSIS — I679 Cerebrovascular disease, unspecified: Secondary | ICD-10-CM | POA: Diagnosis not present

## 2023-09-18 DIAGNOSIS — I69351 Hemiplegia and hemiparesis following cerebral infarction affecting right dominant side: Secondary | ICD-10-CM | POA: Diagnosis not present

## 2023-09-18 DIAGNOSIS — M6281 Muscle weakness (generalized): Secondary | ICD-10-CM | POA: Diagnosis not present

## 2023-09-18 DIAGNOSIS — G8190 Hemiplegia, unspecified affecting unspecified side: Secondary | ICD-10-CM | POA: Diagnosis not present

## 2023-09-18 DIAGNOSIS — I2510 Atherosclerotic heart disease of native coronary artery without angina pectoris: Secondary | ICD-10-CM | POA: Diagnosis not present

## 2023-09-18 DIAGNOSIS — E782 Mixed hyperlipidemia: Secondary | ICD-10-CM | POA: Diagnosis not present

## 2023-09-18 DIAGNOSIS — R279 Unspecified lack of coordination: Secondary | ICD-10-CM | POA: Diagnosis not present

## 2023-09-18 DIAGNOSIS — D0220 Carcinoma in situ of unspecified bronchus and lung: Secondary | ICD-10-CM | POA: Diagnosis not present

## 2023-09-18 DIAGNOSIS — I890 Lymphedema, not elsewhere classified: Secondary | ICD-10-CM | POA: Diagnosis not present

## 2023-09-18 DIAGNOSIS — M2450 Contracture, unspecified joint: Secondary | ICD-10-CM | POA: Diagnosis not present

## 2023-09-19 DIAGNOSIS — R279 Unspecified lack of coordination: Secondary | ICD-10-CM | POA: Diagnosis not present

## 2023-09-19 DIAGNOSIS — M79602 Pain in left arm: Secondary | ICD-10-CM | POA: Diagnosis not present

## 2023-09-19 DIAGNOSIS — M2450 Contracture, unspecified joint: Secondary | ICD-10-CM | POA: Diagnosis not present

## 2023-09-19 DIAGNOSIS — I890 Lymphedema, not elsewhere classified: Secondary | ICD-10-CM | POA: Diagnosis not present

## 2023-09-19 DIAGNOSIS — I69351 Hemiplegia and hemiparesis following cerebral infarction affecting right dominant side: Secondary | ICD-10-CM | POA: Diagnosis not present

## 2023-09-19 DIAGNOSIS — D0220 Carcinoma in situ of unspecified bronchus and lung: Secondary | ICD-10-CM | POA: Diagnosis not present

## 2023-09-19 DIAGNOSIS — M6281 Muscle weakness (generalized): Secondary | ICD-10-CM | POA: Diagnosis not present

## 2023-09-20 DIAGNOSIS — R279 Unspecified lack of coordination: Secondary | ICD-10-CM | POA: Diagnosis not present

## 2023-09-20 DIAGNOSIS — I69351 Hemiplegia and hemiparesis following cerebral infarction affecting right dominant side: Secondary | ICD-10-CM | POA: Diagnosis not present

## 2023-09-20 DIAGNOSIS — I890 Lymphedema, not elsewhere classified: Secondary | ICD-10-CM | POA: Diagnosis not present

## 2023-09-20 DIAGNOSIS — E1151 Type 2 diabetes mellitus with diabetic peripheral angiopathy without gangrene: Secondary | ICD-10-CM | POA: Diagnosis not present

## 2023-09-20 DIAGNOSIS — L84 Corns and callosities: Secondary | ICD-10-CM | POA: Diagnosis not present

## 2023-09-20 DIAGNOSIS — M2450 Contracture, unspecified joint: Secondary | ICD-10-CM | POA: Diagnosis not present

## 2023-09-20 DIAGNOSIS — M79602 Pain in left arm: Secondary | ICD-10-CM | POA: Diagnosis not present

## 2023-09-20 DIAGNOSIS — M6281 Muscle weakness (generalized): Secondary | ICD-10-CM | POA: Diagnosis not present

## 2023-09-20 DIAGNOSIS — D0220 Carcinoma in situ of unspecified bronchus and lung: Secondary | ICD-10-CM | POA: Diagnosis not present

## 2023-09-21 DIAGNOSIS — M6281 Muscle weakness (generalized): Secondary | ICD-10-CM | POA: Diagnosis not present

## 2023-09-21 DIAGNOSIS — M79602 Pain in left arm: Secondary | ICD-10-CM | POA: Diagnosis not present

## 2023-09-21 DIAGNOSIS — I69351 Hemiplegia and hemiparesis following cerebral infarction affecting right dominant side: Secondary | ICD-10-CM | POA: Diagnosis not present

## 2023-09-21 DIAGNOSIS — M2450 Contracture, unspecified joint: Secondary | ICD-10-CM | POA: Diagnosis not present

## 2023-09-21 DIAGNOSIS — I890 Lymphedema, not elsewhere classified: Secondary | ICD-10-CM | POA: Diagnosis not present

## 2023-09-21 DIAGNOSIS — D0220 Carcinoma in situ of unspecified bronchus and lung: Secondary | ICD-10-CM | POA: Diagnosis not present

## 2023-09-21 DIAGNOSIS — R279 Unspecified lack of coordination: Secondary | ICD-10-CM | POA: Diagnosis not present

## 2023-09-22 DIAGNOSIS — M79602 Pain in left arm: Secondary | ICD-10-CM | POA: Diagnosis not present

## 2023-09-22 DIAGNOSIS — I890 Lymphedema, not elsewhere classified: Secondary | ICD-10-CM | POA: Diagnosis not present

## 2023-09-22 DIAGNOSIS — M2450 Contracture, unspecified joint: Secondary | ICD-10-CM | POA: Diagnosis not present

## 2023-09-22 DIAGNOSIS — R279 Unspecified lack of coordination: Secondary | ICD-10-CM | POA: Diagnosis not present

## 2023-09-22 DIAGNOSIS — M6281 Muscle weakness (generalized): Secondary | ICD-10-CM | POA: Diagnosis not present

## 2023-09-22 DIAGNOSIS — D0220 Carcinoma in situ of unspecified bronchus and lung: Secondary | ICD-10-CM | POA: Diagnosis not present

## 2023-09-22 DIAGNOSIS — I69351 Hemiplegia and hemiparesis following cerebral infarction affecting right dominant side: Secondary | ICD-10-CM | POA: Diagnosis not present

## 2023-09-25 DIAGNOSIS — M6281 Muscle weakness (generalized): Secondary | ICD-10-CM | POA: Diagnosis not present

## 2023-09-25 DIAGNOSIS — I69351 Hemiplegia and hemiparesis following cerebral infarction affecting right dominant side: Secondary | ICD-10-CM | POA: Diagnosis not present

## 2023-09-25 DIAGNOSIS — M79602 Pain in left arm: Secondary | ICD-10-CM | POA: Diagnosis not present

## 2023-09-25 DIAGNOSIS — D0220 Carcinoma in situ of unspecified bronchus and lung: Secondary | ICD-10-CM | POA: Diagnosis not present

## 2023-09-25 DIAGNOSIS — I890 Lymphedema, not elsewhere classified: Secondary | ICD-10-CM | POA: Diagnosis not present

## 2023-09-25 DIAGNOSIS — M2450 Contracture, unspecified joint: Secondary | ICD-10-CM | POA: Diagnosis not present

## 2023-09-25 DIAGNOSIS — R279 Unspecified lack of coordination: Secondary | ICD-10-CM | POA: Diagnosis not present

## 2023-09-26 DIAGNOSIS — I69351 Hemiplegia and hemiparesis following cerebral infarction affecting right dominant side: Secondary | ICD-10-CM | POA: Diagnosis not present

## 2023-09-26 DIAGNOSIS — M6281 Muscle weakness (generalized): Secondary | ICD-10-CM | POA: Diagnosis not present

## 2023-09-26 DIAGNOSIS — R279 Unspecified lack of coordination: Secondary | ICD-10-CM | POA: Diagnosis not present

## 2023-09-26 DIAGNOSIS — M79602 Pain in left arm: Secondary | ICD-10-CM | POA: Diagnosis not present

## 2023-09-26 DIAGNOSIS — D0220 Carcinoma in situ of unspecified bronchus and lung: Secondary | ICD-10-CM | POA: Diagnosis not present

## 2023-09-26 DIAGNOSIS — M2450 Contracture, unspecified joint: Secondary | ICD-10-CM | POA: Diagnosis not present

## 2023-09-26 DIAGNOSIS — I890 Lymphedema, not elsewhere classified: Secondary | ICD-10-CM | POA: Diagnosis not present

## 2023-09-27 DIAGNOSIS — R279 Unspecified lack of coordination: Secondary | ICD-10-CM | POA: Diagnosis not present

## 2023-09-27 DIAGNOSIS — M2450 Contracture, unspecified joint: Secondary | ICD-10-CM | POA: Diagnosis not present

## 2023-09-27 DIAGNOSIS — M79602 Pain in left arm: Secondary | ICD-10-CM | POA: Diagnosis not present

## 2023-09-27 DIAGNOSIS — M6281 Muscle weakness (generalized): Secondary | ICD-10-CM | POA: Diagnosis not present

## 2023-09-27 DIAGNOSIS — I69351 Hemiplegia and hemiparesis following cerebral infarction affecting right dominant side: Secondary | ICD-10-CM | POA: Diagnosis not present

## 2023-09-27 DIAGNOSIS — D0220 Carcinoma in situ of unspecified bronchus and lung: Secondary | ICD-10-CM | POA: Diagnosis not present

## 2023-09-27 DIAGNOSIS — I890 Lymphedema, not elsewhere classified: Secondary | ICD-10-CM | POA: Diagnosis not present

## 2023-09-28 DIAGNOSIS — R279 Unspecified lack of coordination: Secondary | ICD-10-CM | POA: Diagnosis not present

## 2023-09-28 DIAGNOSIS — M6281 Muscle weakness (generalized): Secondary | ICD-10-CM | POA: Diagnosis not present

## 2023-09-28 DIAGNOSIS — I890 Lymphedema, not elsewhere classified: Secondary | ICD-10-CM | POA: Diagnosis not present

## 2023-09-28 DIAGNOSIS — M79602 Pain in left arm: Secondary | ICD-10-CM | POA: Diagnosis not present

## 2023-09-28 DIAGNOSIS — M2450 Contracture, unspecified joint: Secondary | ICD-10-CM | POA: Diagnosis not present

## 2023-09-28 DIAGNOSIS — I69351 Hemiplegia and hemiparesis following cerebral infarction affecting right dominant side: Secondary | ICD-10-CM | POA: Diagnosis not present

## 2023-09-28 DIAGNOSIS — D0220 Carcinoma in situ of unspecified bronchus and lung: Secondary | ICD-10-CM | POA: Diagnosis not present

## 2023-09-29 DIAGNOSIS — M79602 Pain in left arm: Secondary | ICD-10-CM | POA: Diagnosis not present

## 2023-09-29 DIAGNOSIS — N189 Chronic kidney disease, unspecified: Secondary | ICD-10-CM | POA: Diagnosis not present

## 2023-09-29 DIAGNOSIS — M2450 Contracture, unspecified joint: Secondary | ICD-10-CM | POA: Diagnosis not present

## 2023-09-29 DIAGNOSIS — R279 Unspecified lack of coordination: Secondary | ICD-10-CM | POA: Diagnosis not present

## 2023-09-29 DIAGNOSIS — I509 Heart failure, unspecified: Secondary | ICD-10-CM | POA: Diagnosis not present

## 2023-09-29 DIAGNOSIS — D0220 Carcinoma in situ of unspecified bronchus and lung: Secondary | ICD-10-CM | POA: Diagnosis not present

## 2023-09-29 DIAGNOSIS — I69351 Hemiplegia and hemiparesis following cerebral infarction affecting right dominant side: Secondary | ICD-10-CM | POA: Diagnosis not present

## 2023-09-29 DIAGNOSIS — M6281 Muscle weakness (generalized): Secondary | ICD-10-CM | POA: Diagnosis not present

## 2023-09-29 DIAGNOSIS — I890 Lymphedema, not elsewhere classified: Secondary | ICD-10-CM | POA: Diagnosis not present

## 2023-10-02 DIAGNOSIS — M6281 Muscle weakness (generalized): Secondary | ICD-10-CM | POA: Diagnosis not present

## 2023-10-02 DIAGNOSIS — I69351 Hemiplegia and hemiparesis following cerebral infarction affecting right dominant side: Secondary | ICD-10-CM | POA: Diagnosis not present

## 2023-10-02 DIAGNOSIS — I890 Lymphedema, not elsewhere classified: Secondary | ICD-10-CM | POA: Diagnosis not present

## 2023-10-02 DIAGNOSIS — D0220 Carcinoma in situ of unspecified bronchus and lung: Secondary | ICD-10-CM | POA: Diagnosis not present

## 2023-10-02 DIAGNOSIS — M2450 Contracture, unspecified joint: Secondary | ICD-10-CM | POA: Diagnosis not present

## 2023-10-02 DIAGNOSIS — M79602 Pain in left arm: Secondary | ICD-10-CM | POA: Diagnosis not present

## 2023-10-02 DIAGNOSIS — R279 Unspecified lack of coordination: Secondary | ICD-10-CM | POA: Diagnosis not present

## 2023-10-03 DIAGNOSIS — I69351 Hemiplegia and hemiparesis following cerebral infarction affecting right dominant side: Secondary | ICD-10-CM | POA: Diagnosis not present

## 2023-10-03 DIAGNOSIS — M2450 Contracture, unspecified joint: Secondary | ICD-10-CM | POA: Diagnosis not present

## 2023-10-03 DIAGNOSIS — M79602 Pain in left arm: Secondary | ICD-10-CM | POA: Diagnosis not present

## 2023-10-03 DIAGNOSIS — D0220 Carcinoma in situ of unspecified bronchus and lung: Secondary | ICD-10-CM | POA: Diagnosis not present

## 2023-10-03 DIAGNOSIS — I890 Lymphedema, not elsewhere classified: Secondary | ICD-10-CM | POA: Diagnosis not present

## 2023-10-03 DIAGNOSIS — M6281 Muscle weakness (generalized): Secondary | ICD-10-CM | POA: Diagnosis not present

## 2023-10-03 DIAGNOSIS — R279 Unspecified lack of coordination: Secondary | ICD-10-CM | POA: Diagnosis not present

## 2023-10-04 DIAGNOSIS — I69351 Hemiplegia and hemiparesis following cerebral infarction affecting right dominant side: Secondary | ICD-10-CM | POA: Diagnosis not present

## 2023-10-04 DIAGNOSIS — I890 Lymphedema, not elsewhere classified: Secondary | ICD-10-CM | POA: Diagnosis not present

## 2023-10-04 DIAGNOSIS — M79602 Pain in left arm: Secondary | ICD-10-CM | POA: Diagnosis not present

## 2023-10-04 DIAGNOSIS — G894 Chronic pain syndrome: Secondary | ICD-10-CM | POA: Diagnosis not present

## 2023-10-04 DIAGNOSIS — D0220 Carcinoma in situ of unspecified bronchus and lung: Secondary | ICD-10-CM | POA: Diagnosis not present

## 2023-10-04 DIAGNOSIS — R279 Unspecified lack of coordination: Secondary | ICD-10-CM | POA: Diagnosis not present

## 2023-10-04 DIAGNOSIS — M2450 Contracture, unspecified joint: Secondary | ICD-10-CM | POA: Diagnosis not present

## 2023-10-04 DIAGNOSIS — M6281 Muscle weakness (generalized): Secondary | ICD-10-CM | POA: Diagnosis not present

## 2023-10-05 DIAGNOSIS — I69351 Hemiplegia and hemiparesis following cerebral infarction affecting right dominant side: Secondary | ICD-10-CM | POA: Diagnosis not present

## 2023-10-05 DIAGNOSIS — R279 Unspecified lack of coordination: Secondary | ICD-10-CM | POA: Diagnosis not present

## 2023-10-05 DIAGNOSIS — M6281 Muscle weakness (generalized): Secondary | ICD-10-CM | POA: Diagnosis not present

## 2023-10-05 DIAGNOSIS — D0220 Carcinoma in situ of unspecified bronchus and lung: Secondary | ICD-10-CM | POA: Diagnosis not present

## 2023-10-05 DIAGNOSIS — M2450 Contracture, unspecified joint: Secondary | ICD-10-CM | POA: Diagnosis not present

## 2023-10-05 DIAGNOSIS — I890 Lymphedema, not elsewhere classified: Secondary | ICD-10-CM | POA: Diagnosis not present

## 2023-10-05 DIAGNOSIS — M79602 Pain in left arm: Secondary | ICD-10-CM | POA: Diagnosis not present

## 2023-10-06 DIAGNOSIS — I69351 Hemiplegia and hemiparesis following cerebral infarction affecting right dominant side: Secondary | ICD-10-CM | POA: Diagnosis not present

## 2023-10-06 DIAGNOSIS — I890 Lymphedema, not elsewhere classified: Secondary | ICD-10-CM | POA: Diagnosis not present

## 2023-10-06 DIAGNOSIS — M79602 Pain in left arm: Secondary | ICD-10-CM | POA: Diagnosis not present

## 2023-10-06 DIAGNOSIS — M2450 Contracture, unspecified joint: Secondary | ICD-10-CM | POA: Diagnosis not present

## 2023-10-06 DIAGNOSIS — R279 Unspecified lack of coordination: Secondary | ICD-10-CM | POA: Diagnosis not present

## 2023-10-06 DIAGNOSIS — D0220 Carcinoma in situ of unspecified bronchus and lung: Secondary | ICD-10-CM | POA: Diagnosis not present

## 2023-10-06 DIAGNOSIS — M6281 Muscle weakness (generalized): Secondary | ICD-10-CM | POA: Diagnosis not present

## 2023-10-10 DIAGNOSIS — G4700 Insomnia, unspecified: Secondary | ICD-10-CM | POA: Diagnosis not present

## 2023-10-12 ENCOUNTER — Inpatient Hospital Stay: Payer: Medicare Other | Attending: Internal Medicine | Admitting: Internal Medicine

## 2023-10-12 VITALS — BP 151/90 | HR 93 | Temp 97.7°F | Resp 18 | Ht 73.0 in

## 2023-10-12 DIAGNOSIS — Z8673 Personal history of transient ischemic attack (TIA), and cerebral infarction without residual deficits: Secondary | ICD-10-CM | POA: Insufficient documentation

## 2023-10-12 DIAGNOSIS — D472 Monoclonal gammopathy: Secondary | ICD-10-CM | POA: Diagnosis not present

## 2023-10-12 DIAGNOSIS — C3412 Malignant neoplasm of upper lobe, left bronchus or lung: Secondary | ICD-10-CM | POA: Diagnosis not present

## 2023-10-12 DIAGNOSIS — G8191 Hemiplegia, unspecified affecting right dominant side: Secondary | ICD-10-CM | POA: Diagnosis not present

## 2023-10-12 DIAGNOSIS — R109 Unspecified abdominal pain: Secondary | ICD-10-CM | POA: Diagnosis not present

## 2023-10-12 DIAGNOSIS — C3490 Malignant neoplasm of unspecified part of unspecified bronchus or lung: Secondary | ICD-10-CM | POA: Insufficient documentation

## 2023-10-12 NOTE — H&P (View-Only) (Signed)
[facility] [phone]   [phone]  OFFICE PROGRESS NOTE  [name], DO [address] [place] [address]  DIAGNOSIS:  1) history of non-small cell lung cancer 2) MGUS  PRIOR THERAPY: None  CURRENT THERAPY: Observation  INTERVAL HISTORY: [name] 77 y.o. male returns to the clinic today for annual follow-up visit accompanied by his wife.Discussed the use of AI scribe software for clinical note transcription with the patient, who gave verbal consent to proceed.  History of Present Illness   [name], a 77 year old patient with a history of non-small cell lung cancer and MGUS, currently resides in a nursing facility due to non-transferability. The patient has been experiencing right-sided paralysis, affecting both the arm and leg, for approximately 25-26 years, a condition attributed to a past stroke. The patient also reports pain on the right side of the abdomen. The patient's condition has necessitated assistance for transfers and mobility, including moving from bed to chair, to prevent falls. The patient's speech is limited, and there is a frequent cough noted. The patient's condition has been stable, with no new symptoms or changes reported.        MEDICAL HISTORY: Past Medical History:  Diagnosis Date   Arthritis    Asthma    "hx of"   Barrett's esophagus    EGD 03/23/2011 & EGD 2/09 bx proven   BPH (benign prostatic hyperplasia)    Cancer of parotid gland (HCC) 11/23/12   Adenocarcinoma   Chronic abdominal pain    Chronic constipation    Colon polyp 03/23/2011   tubular adenoma, Dr. Jena Gauss   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) 07/15/2011   CVA (cerebral infarction) 1998   right sided deficit   Delayed gastric emptying 2018   Diverticulosis    TCS 03/23/11 pancolonic diverticula &TCS 5/08, pancolonic diverticula   DM (diabetes mellitus) (HCC)    Edema of lower extremity 12/21/12    bilateral    Esotropia of left eye    GERD (gastroesophageal reflux disease)    Glaucoma (increased eye pressure)    Gout    Gout    Hemorrhagic colitis 06/06/2012.   Hemorrhoids, internal 03/23/2011   tcs by Dr. Jena Gauss   Hepatitis    esosiniphilic, tx with prednisone   Hiatal hernia    History of radiation therapy 05/21/18- 05/30/18   Left Lung/ 54 Gy delivered in 3 fractions of 18 Gy. SBRT   HTN (hypertension)    Hx of radiation therapy 1974   right base of skull area-lymphoma   Hyperlipidemia    Lower facial weakness    Right   Lymphoma (HCC) 1974   XRT at [facility], right base of skull area   Neuropathy    Non-small cell lung cancer (HCC) dx'd 04/26/18   Peripheral edema    R>L legs   Rash    chronic, recurrent, R>L legs   Renal insufficiency    Steatohepatitis    liver biopsy 2009   Stroke [facility]) 1998   right hemiparesis/plegia    ALLERGIES:  has No Known Allergies.  MEDICATIONS:  Current Outpatient Medications  Medication Sig Dispense Refill   allopurinol (ZYLOPRIM) 300 MG tablet TAKE 1 TABLET(300 MG) BY MOUTH DAILY (Patient taking differently: Take 300 mg by mouth daily. TAKE 1 TABLET(300 MG) BY MOUTH DAILY) 90 tablet 2   amLODipine (NORVASC) 10 MG tablet Take 1 tablet (10 mg total) by mouth daily. 90 tablet 2  ASPIRIN ADULT LOW STRENGTH 81 MG tablet Take 1 tablet (81 mg total) by mouth daily. Restart in 1 week 30 tablet 12   b complex-vitamin c-folic acid (NEPHRO-VITE) 0.8 MG TABS tablet Take 1 tablet by mouth at bedtime.     Baclofen 5 MG TABS Take 1 tablet by mouth 3 (three) times daily.     buPROPion ER (WELLBUTRIN SR) 100 MG 12 hr tablet Take 100 mg by mouth 2 (two) times daily.     cinacalcet (SENSIPAR) 30 MG tablet Take 30 mg by mouth daily with breakfast.     cloNIDine (CATAPRES) 0.2 MG tablet Take 1 tablet (0.2 mg total) by mouth 3 (three) times daily. TAKE 1 TABLET(0.2 MG) BY MOUTH THREE TIMES DAILY 270 tablet 2   cyanocobalamin (VITAMIN B12) 1000 MCG tablet Take  1 tablet (1,000 mcg total) by mouth daily.     gabapentin (NEURONTIN) 300 MG capsule Take 2 capsules (600 mg total) by mouth 2 (two) times daily. May also take 2 capsules (600 mg total) daily as needed. (Patient taking differently: 300 MG Twice a day) 540 capsule 3   glucose blood (ONETOUCH VERIO) test strip Use as directed to check Blood Glucose twice daily 150 strip 5   insulin glargine (LANTUS SOLOSTAR) 100 UNIT/ML Solostar Pen Inject 30 Units into the skin at bedtime. (Patient taking differently: Inject 15 Units into the skin at bedtime.) 15 mL 2   iron polysaccharides (NIFEREX) 150 MG capsule TAKE 1 CAPSULE(150 MG) BY MOUTH DAILY (Patient taking differently: Take 150 mg by mouth daily.) 90 capsule 2   Lancets (ONETOUCH DELICA PLUS LANCET33G) MISC USE TO TEST BLOOD SUGAR FOUR TIMES DAILY 200 each 2   lisinopril (ZESTRIL) 20 MG tablet TAKE 1 TABLET(20 MG) BY MOUTH DAILY (Patient taking differently: Take 20 mg by mouth daily.) 90 tablet 2   metoprolol tartrate (LOPRESSOR) 50 MG tablet Take 50 mg by mouth daily.     oxyCODONE-acetaminophen (PERCOCET) 7.5-325 MG tablet Take 1 tablet by mouth every 4 (four) hours as needed. 180 tablet 0   pantoprazole (PROTONIX) 40 MG tablet TAKE 1 TABLET(40 MG) BY MOUTH DAILY (Patient taking differently: Take 40 mg by mouth daily. TAKE 1 TABLET(40 MG) BY MOUTH DAILY) 90 tablet 2   polyethylene glycol powder (MIRALAX) 17 GM/SCOOP powder Take one capful daily. Hold for diarrhea. 527 g 5   Prucalopride Succinate (MOTEGRITY) 2 MG TABS Take 1 tablet (2 mg total) by mouth daily. 30 tablet 11   simvastatin (ZOCOR) 20 MG tablet TAKE 1 TABLET(20 MG) BY MOUTH AT BEDTIME (Patient taking differently: Take 20 mg by mouth daily at 6 PM.) 90 tablet 2   tamsulosin (FLOMAX) 0.4 MG CAPS capsule TAKE 1 CAPSULE(0.4 MG) BY MOUTH DAILY (Patient taking differently: Take 0.4 mg by mouth daily.) 90 capsule 2   traZODone (DESYREL) 100 MG tablet Take 100 mg by mouth at bedtime.     No current  facility-administered medications for this visit.    SURGICAL HISTORY:  Past Surgical History:  Procedure Laterality Date   BIOPSY  12/01/2016   Procedure: BIOPSY;  Surgeon: Corbin Ade, MD;  Location: [facility];  Service: Endoscopy;;  duodenum, gastric, esophagus   BIOPSY  07/15/2022   Procedure: BIOPSY;  Surgeon: Corbin Ade, MD;  Location: [facility];  Service: Endoscopy;;  duodenal, mass;   CHOLECYSTECTOMY     COLONOSCOPY  03/23/11   Dr. Jena Gauss  pancolonic diverticula, hemorrhoids, tubular adenoma.. next tcs 03/2016   COLONOSCOPY WITH  PROPOFOL N/A 12/01/2016   inadequate bowel prep precluded exam   ESOPHAGOGASTRODUODENOSCOPY  02/05/08   goblet cell metaplasia/negative for H.pylori   ESOPHAGOGASTRODUODENOSCOPY  03/23/11   Dr. Jena Gauss, barretts, hiatal hernia   ESOPHAGOGASTRODUODENOSCOPY (EGD) WITH PROPOFOL N/A 12/01/2016   Dr. Jena Gauss: Large amount of retained gastric contents precluded completion of the stomach. Mucosal changes were found in the stomach. Erosions and somewhat scalloped appearing mucosa present, reactive gastritis/no H pylori. Barrett's esophagus noted, no dysplasia on biopsy. Duodenal biopsies taken as well, benign, no evidence of eosinophilia.   ESOPHAGOGASTRODUODENOSCOPY (EGD) WITH PROPOFOL N/A 07/15/2022   Procedure: ESOPHAGOGASTRODUODENOSCOPY (EGD) WITH PROPOFOL;  Surgeon: Corbin Ade, MD;  [facility]: [facility];  Service: Endoscopy;  Laterality: N/A;   IR FLUORO GUIDED NEEDLE PLC ASPIRATION/INJECTION LOC  12/13/2018   IR KYPHO LUMBAR INC FX REDUCE BONE BX UNI/BIL CANNULATION INC/IMAGING  12/13/2018   IR RADIOLOGY PERIPHERAL GUIDED IV START  12/13/2018   IR US GUIDE VASC ACCESS LEFT  12/13/2018   MASS BIOPSY  11/01/2012   Procedure: NECK MASS BIOPSY;  Surgeon: Darletta Moll, MD;  Location: [facility];  Service: ENT;  Laterality: Right;  Excisional Bx Right Neck Mass; attempted external jugular cutdown of left side   PAROTIDECTOMY  11/24/2012   Procedure: PAROTIDECTOMY;   Surgeon: Darletta Moll, MD;  Location: [facility] OR;  Service: ENT;  Laterality: N/A;  Total parotidectomy   PLEURECTOMY     right lymph node removal Right    behind right ear   Right video-assisted thoracic surgery, pleurectomy, and pleurodesis  2011   VIDEO BRONCHOSCOPY WITH ENDOBRONCHIAL NAVIGATION N/A 04/26/2018   Procedure: VIDEO BRONCHOSCOPY WITH ENDOBRONCHIAL NAVIGATION;  Surgeon: Loreli Slot, MD;  Location: [facility] OR;  Service: Thoracic;  Laterality: N/A;    REVIEW OF SYSTEMS:  A comprehensive review of systems was negative except for: Constitutional: positive for fatigue Respiratory: positive for dyspnea on exertion Musculoskeletal: positive for arthralgias and muscle weakness Neurological: positive for weakness   PHYSICAL EXAMINATION: General appearance: alert, cooperative, fatigued, and no distress Head: Normocephalic, without obvious abnormality, atraumatic Neck: no adenopathy, no JVD, supple, symmetrical, trachea midline, and thyroid not enlarged, symmetric, no tenderness/mass/nodules Lymph nodes: Cervical, supraclavicular, and axillary nodes normal. Resp: clear to auscultation bilaterally Back: symmetric, no curvature. ROM normal. No CVA tenderness. Cardio: regular rate and rhythm, S1, S2 normal, no murmur, click, rub or gallop GI: soft, non-tender; bowel sounds normal; no masses,  no organomegaly Extremities: extremities normal, atraumatic, no cyanosis or edema  ECOG PERFORMANCE STATUS: 1 - Symptomatic but completely ambulatory  Blood pressure (!) 151/90, pulse 93, temperature 97.7 F (36.5 C), temperature source Oral, resp. rate 18, height 6\' 1"  (1.854 m), SpO2 99%.  LABORATORY DATA: Lab Results  Component Value Date   WBC 12.4 (H) 05/10/2023   HGB 10.1 (L) 05/10/2023   HCT 31.4 (L) 05/10/2023   MCV 70.2 (L) 05/10/2023   PLT 342 05/10/2023      Chemistry      Component Value Date/Time   NA 140 05/10/2023 0941   NA 143 12/16/2020 0918   K 4.3 05/10/2023 0941   K  4.3 08/16/2011 0957   CL 103 05/10/2023 0941   CO2 30 05/10/2023 0941   BUN 44 (H) 05/10/2023 0941   BUN 28 (H) 12/16/2020 0918   BUN 23 08/16/2011 0957   CREATININE 2.13 (H) 05/10/2023 0941   CREATININE 1.69 (H) 09/21/2021 1242      Component Value Date/Time   CALCIUM 8.9 05/10/2023 0941  CALCIUM 10.3 08/16/2011 0957   CALCIUM 11.4 (H) 08/12/2011 1349   ALKPHOS 109 05/10/2023 0941   ALKPHOS 80 08/16/2011 0957   AST 10 (L) 05/10/2023 0941   ALT 8 05/10/2023 0941   BILITOT 0.2 (L) 05/10/2023 0941       RADIOGRAPHIC STUDIES: No results found.   ASSESSMENT AND PLAN: This is a very pleasant 77 years old African-American male with history of stage Ib non-small cell lung cancer status post SBRT under the care of Dr. Basilio Cairo and he is followed with her for monitoring of his condition. The patient was referred to [place] for evaluation of MGUS and to rule out multiple myeloma.  His protein study showed elevated light chains but his bone marrow biopsy and aspirate showed only 5% of plasma cells.    Non-small cell lung cancer History of disease, currently in a nursing home with limited mobility due to stroke. Given the patient's overall condition, aggressive treatment is not feasible. -No further follow-up visits planned.  Monoclonal Gammopathy of Undetermined Significance (MGUS) Rising numbers, concern for progression to multiple myeloma. However, given the patient's overall condition, aggressive treatment is not feasible. -No further follow-up visits planned.  Right-sided paralysis post-stroke Patient has right-sided paralysis following a stroke 25-26 years ago. Currently in a nursing home due to non-transferable status and need for assistance with mobility. -Continue care at the nursing home.  Abdominal pain Patient reports pain on the right side of the abdomen. No further details provided. -Continue monitoring at the nursing home.  General Health Maintenance Given the patient's  overall condition and limited mobility, the focus is on comfort and symptom management at the nursing home. -No further follow-up visits planned.   The patient was advised to call immediately if he has any concerning symptoms in the interval. The patient voices understanding of current disease status and treatment options and is in agreement with the current care plan.  All questions were answered. The patient knows to call the clinic with any problems, questions or concerns. We can certainly see the patient much sooner if necessary.  Disclaimer: This note was dictated with voice recognition software. Similar sounding words can inadvertently be transcribed and may not be corrected upon review.

## 2023-10-12 NOTE — Progress Notes (Signed)
[facility] [phone]   [phone]  OFFICE PROGRESS NOTE  Jason Dade, DO [address] [place] [address]  DIAGNOSIS:  1) history of non-small cell lung cancer 2) MGUS  PRIOR THERAPY: None  CURRENT THERAPY: Observation  INTERVAL HISTORY: [name] 77 y.o. male returns to the clinic today for annual follow-up visit accompanied by his wife.Discussed the use of AI scribe software for clinical note transcription with the patient, who gave verbal consent to proceed.  History of Present Illness   [name], a 77 year old patient with a history of non-small cell lung cancer and MGUS, currently resides in a nursing facility due to non-transferability. The patient has been experiencing right-sided paralysis, affecting both the arm and leg, for approximately 25-26 years, a condition attributed to a past stroke. The patient also reports pain on the right side of the abdomen. The patient's condition has necessitated assistance for transfers and mobility, including moving from bed to chair, to prevent falls. The patient's speech is limited, and there is a frequent cough noted. The patient's condition has been stable, with no new symptoms or changes reported.        MEDICAL HISTORY: Past Medical History:  Diagnosis Date   Arthritis    Asthma    "hx of"   Barrett's esophagus    EGD 03/23/2011 & EGD 2/09 bx proven   BPH (benign prostatic hyperplasia)    Cancer of parotid gland (HCC) 11/23/12   Adenocarcinoma   Chronic abdominal pain    Chronic constipation    Colon polyp 03/23/2011   tubular adenoma, Dr. Jena Gauss   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) 07/15/2011   CVA (cerebral infarction) 1998   right sided deficit   Delayed gastric emptying 2018   Diverticulosis    TCS 03/23/11 pancolonic diverticula &TCS 5/08, pancolonic diverticula   DM (diabetes mellitus) (HCC)    Edema of lower extremity 12/21/12    bilateral    Esotropia of left eye    GERD (gastroesophageal reflux disease)    Glaucoma (increased eye pressure)    Gout    Gout    Hemorrhagic colitis 06/06/2012.   Hemorrhoids, internal 03/23/2011   tcs by Dr. Jena Gauss   Hepatitis    esosiniphilic, tx with prednisone   Hiatal hernia    History of radiation therapy 05/21/18- 05/30/18   Left Lung/ 54 Gy delivered in 3 fractions of 18 Gy. SBRT   HTN (hypertension)    Hx of radiation therapy 1974   right base of skull area-lymphoma   Hyperlipidemia    Lower facial weakness    Right   Lymphoma (HCC) 1974   XRT at [facility], right base of skull area   Neuropathy    Non-small cell lung cancer (HCC) dx'd 04/26/18   Peripheral edema    R>L legs   Rash    chronic, recurrent, R>L legs   Renal insufficiency    Steatohepatitis    liver biopsy 2009   Stroke [facility]) 1998   right hemiparesis/plegia    ALLERGIES:  has No Known Allergies.  MEDICATIONS:  Current Outpatient Medications  Medication Sig Dispense Refill   allopurinol (ZYLOPRIM) 300 MG tablet TAKE 1 TABLET(300 MG) BY MOUTH DAILY (Patient taking differently: Take 300 mg by mouth daily. TAKE 1 TABLET(300 MG) BY MOUTH DAILY) 90 tablet 2   amLODipine (NORVASC) 10 MG tablet Take 1 tablet (10 mg total) by mouth daily. 90 tablet 2  ASPIRIN ADULT LOW STRENGTH 81 MG tablet Take 1 tablet (81 mg total) by mouth daily. Restart in 1 week 30 tablet 12   b complex-vitamin c-folic acid (NEPHRO-VITE) 0.8 MG TABS tablet Take 1 tablet by mouth at bedtime.     Baclofen 5 MG TABS Take 1 tablet by mouth 3 (three) times daily.     buPROPion ER (WELLBUTRIN SR) 100 MG 12 hr tablet Take 100 mg by mouth 2 (two) times daily.     cinacalcet (SENSIPAR) 30 MG tablet Take 30 mg by mouth daily with breakfast.     cloNIDine (CATAPRES) 0.2 MG tablet Take 1 tablet (0.2 mg total) by mouth 3 (three) times daily. TAKE 1 TABLET(0.2 MG) BY MOUTH THREE TIMES DAILY 270 tablet 2   cyanocobalamin (VITAMIN B12) 1000 MCG tablet Take  1 tablet (1,000 mcg total) by mouth daily.     gabapentin (NEURONTIN) 300 MG capsule Take 2 capsules (600 mg total) by mouth 2 (two) times daily. May also take 2 capsules (600 mg total) daily as needed. (Patient taking differently: 300 MG Twice a day) 540 capsule 3   glucose blood (ONETOUCH VERIO) test strip Use as directed to check Blood Glucose twice daily 150 strip 5   insulin glargine (LANTUS SOLOSTAR) 100 UNIT/ML Solostar Pen Inject 30 Units into the skin at bedtime. (Patient taking differently: Inject 15 Units into the skin at bedtime.) 15 mL 2   iron polysaccharides (NIFEREX) 150 MG capsule TAKE 1 CAPSULE(150 MG) BY MOUTH DAILY (Patient taking differently: Take 150 mg by mouth daily.) 90 capsule 2   Lancets (ONETOUCH DELICA PLUS LANCET33G) MISC USE TO TEST BLOOD SUGAR FOUR TIMES DAILY 200 each 2   lisinopril (ZESTRIL) 20 MG tablet TAKE 1 TABLET(20 MG) BY MOUTH DAILY (Patient taking differently: Take 20 mg by mouth daily.) 90 tablet 2   metoprolol tartrate (LOPRESSOR) 50 MG tablet Take 50 mg by mouth daily.     oxyCODONE-acetaminophen (PERCOCET) 7.5-325 MG tablet Take 1 tablet by mouth every 4 (four) hours as needed. 180 tablet 0   pantoprazole (PROTONIX) 40 MG tablet TAKE 1 TABLET(40 MG) BY MOUTH DAILY (Patient taking differently: Take 40 mg by mouth daily. TAKE 1 TABLET(40 MG) BY MOUTH DAILY) 90 tablet 2   polyethylene glycol powder (MIRALAX) 17 GM/SCOOP powder Take one capful daily. Hold for diarrhea. 527 g 5   Prucalopride Succinate (MOTEGRITY) 2 MG TABS Take 1 tablet (2 mg total) by mouth daily. 30 tablet 11   simvastatin (ZOCOR) 20 MG tablet TAKE 1 TABLET(20 MG) BY MOUTH AT BEDTIME (Patient taking differently: Take 20 mg by mouth daily at 6 PM.) 90 tablet 2   tamsulosin (FLOMAX) 0.4 MG CAPS capsule TAKE 1 CAPSULE(0.4 MG) BY MOUTH DAILY (Patient taking differently: Take 0.4 mg by mouth daily.) 90 capsule 2   traZODone (DESYREL) 100 MG tablet Take 100 mg by mouth at bedtime.     No current  facility-administered medications for this visit.    SURGICAL HISTORY:  Past Surgical History:  Procedure Laterality Date   BIOPSY  12/01/2016   Procedure: BIOPSY;  Surgeon: Corbin Ade, MD;  Location: [facility];  Service: Endoscopy;;  duodenum, gastric, esophagus   BIOPSY  07/15/2022   Procedure: BIOPSY;  Surgeon: Corbin Ade, MD;  Location: [facility];  Service: Endoscopy;;  duodenal, mass;   CHOLECYSTECTOMY     COLONOSCOPY  03/23/11   Dr. Jena Gauss  pancolonic diverticula, hemorrhoids, tubular adenoma.. next tcs 03/2016   COLONOSCOPY WITH  PROPOFOL N/A 12/01/2016   inadequate bowel prep precluded exam   ESOPHAGOGASTRODUODENOSCOPY  02/05/08   goblet cell metaplasia/negative for H.pylori   ESOPHAGOGASTRODUODENOSCOPY  03/23/11   Dr. Jena Gauss, barretts, hiatal hernia   ESOPHAGOGASTRODUODENOSCOPY (EGD) WITH PROPOFOL N/A 12/01/2016   Dr. Jena Gauss: Large amount of retained gastric contents precluded completion of the stomach. Mucosal changes were found in the stomach. Erosions and somewhat scalloped appearing mucosa present, reactive gastritis/no H pylori. Barrett's esophagus noted, no dysplasia on biopsy. Duodenal biopsies taken as well, benign, no evidence of eosinophilia.   ESOPHAGOGASTRODUODENOSCOPY (EGD) WITH PROPOFOL N/A 07/15/2022   Procedure: ESOPHAGOGASTRODUODENOSCOPY (EGD) WITH PROPOFOL;  Surgeon: Corbin Ade, MD;  [facility]: [facility];  Service: Endoscopy;  Laterality: N/A;   IR FLUORO GUIDED NEEDLE PLC ASPIRATION/INJECTION LOC  12/13/2018   IR KYPHO LUMBAR INC FX REDUCE BONE BX UNI/BIL CANNULATION INC/IMAGING  12/13/2018   IR RADIOLOGY PERIPHERAL GUIDED IV START  12/13/2018   IR US GUIDE VASC ACCESS LEFT  12/13/2018   MASS BIOPSY  11/01/2012   Procedure: NECK MASS BIOPSY;  Surgeon: Darletta Moll, MD;  Location: [facility];  Service: ENT;  Laterality: Right;  Excisional Bx Right Neck Mass; attempted external jugular cutdown of left side   PAROTIDECTOMY  11/24/2012   Procedure: PAROTIDECTOMY;   Surgeon: Darletta Moll, MD;  Location: [facility];  Service: ENT;  Laterality: N/A;  Total parotidectomy   PLEURECTOMY     right lymph node removal Right    behind right ear   Right video-assisted thoracic surgery, pleurectomy, and pleurodesis  2011   VIDEO BRONCHOSCOPY WITH ENDOBRONCHIAL NAVIGATION N/A 04/26/2018   Procedure: VIDEO BRONCHOSCOPY WITH ENDOBRONCHIAL NAVIGATION;  Surgeon: Loreli Slot, MD;  Location: [facility] OR;  Service: Thoracic;  Laterality: N/A;    REVIEW OF SYSTEMS:  A comprehensive review of systems was negative except for: Constitutional: positive for fatigue Respiratory: positive for dyspnea on exertion Musculoskeletal: positive for arthralgias and muscle weakness Neurological: positive for weakness   PHYSICAL EXAMINATION: General appearance: alert, cooperative, fatigued, and no distress Head: Normocephalic, without obvious abnormality, atraumatic Neck: no adenopathy, no JVD, supple, symmetrical, trachea midline, and thyroid not enlarged, symmetric, no tenderness/mass/nodules Lymph nodes: Cervical, supraclavicular, and axillary nodes normal. Resp: clear to auscultation bilaterally Back: symmetric, no curvature. ROM normal. No CVA tenderness. Cardio: regular rate and rhythm, S1, S2 normal, no murmur, click, rub or gallop GI: soft, non-tender; bowel sounds normal; no masses,  no organomegaly Extremities: extremities normal, atraumatic, no cyanosis or edema  ECOG PERFORMANCE STATUS: 1 - Symptomatic but completely ambulatory  Blood pressure (!) 151/90, pulse 93, temperature 97.7 F (36.5 C), temperature source Oral, resp. rate 18, height 6\' 1"  (1.854 m), SpO2 99%.  LABORATORY DATA: Lab Results  Component Value Date   WBC 12.4 (H) 05/10/2023   HGB 10.1 (L) 05/10/2023   HCT 31.4 (L) 05/10/2023   MCV 70.2 (L) 05/10/2023   PLT 342 05/10/2023      Chemistry      Component Value Date/Time   NA 140 05/10/2023 0941   NA 143 12/16/2020 0918   K 4.3 05/10/2023 0941   K  4.3 08/16/2011 0957   CL 103 05/10/2023 0941   CO2 30 05/10/2023 0941   BUN 44 (H) 05/10/2023 0941   BUN 28 (H) 12/16/2020 0918   BUN 23 08/16/2011 0957   CREATININE 2.13 (H) 05/10/2023 0941   CREATININE 1.69 (H) 09/21/2021 1242      Component Value Date/Time   CALCIUM 8.9 05/10/2023 0941  CALCIUM 10.3 08/16/2011 0957   CALCIUM 11.4 (H) 08/12/2011 1349   ALKPHOS 109 05/10/2023 0941   ALKPHOS 80 08/16/2011 0957   AST 10 (L) 05/10/2023 0941   ALT 8 05/10/2023 0941   BILITOT 0.2 (L) 05/10/2023 0941       RADIOGRAPHIC STUDIES: No results found.   ASSESSMENT AND PLAN: This is a very pleasant 77 years old African-American male with history of stage Ib non-small cell lung cancer status post SBRT under the care of Dr. Basilio Cairo and he is followed with her for monitoring of his condition. The patient was referred to [place] for evaluation of MGUS and to rule out multiple myeloma.  His protein study showed elevated light chains but his bone marrow biopsy and aspirate showed only 5% of plasma cells.    Non-small cell lung cancer History of disease, currently in a nursing home with limited mobility due to stroke. Given the patient's overall condition, aggressive treatment is not feasible. -No further follow-up visits planned.  Monoclonal Gammopathy of Undetermined Significance (MGUS) Rising numbers, concern for progression to multiple myeloma. However, given the patient's overall condition, aggressive treatment is not feasible. -No further follow-up visits planned.  Right-sided paralysis post-stroke Patient has right-sided paralysis following a stroke 25-26 years ago. Currently in a nursing home due to non-transferable status and need for assistance with mobility. -Continue care at the nursing home.  Abdominal pain Patient reports pain on the right side of the abdomen. No further details provided. -Continue monitoring at the nursing home.  General Health Maintenance Given the patient's  overall condition and limited mobility, the focus is on comfort and symptom management at the nursing home. -No further follow-up visits planned.   The patient was advised to call immediately if he has any concerning symptoms in the interval. The patient voices understanding of current disease status and treatment options and is in agreement with the current care plan.  All questions were answered. The patient knows to call the clinic with any problems, questions or concerns. We can certainly see the patient much sooner if necessary.  Disclaimer: This note was dictated with voice recognition software. Similar sounding words can inadvertently be transcribed and may not be corrected upon review.

## 2023-10-16 DIAGNOSIS — R112 Nausea with vomiting, unspecified: Secondary | ICD-10-CM | POA: Diagnosis not present

## 2023-10-16 DIAGNOSIS — R109 Unspecified abdominal pain: Secondary | ICD-10-CM | POA: Diagnosis not present

## 2023-10-17 ENCOUNTER — Inpatient Hospital Stay (HOSPITAL_COMMUNITY)
Admission: EM | Admit: 2023-10-17 | Discharge: 2023-10-20 | DRG: 377 | Disposition: A | Discharge status: Skilled Nursing Facility | Payer: Medicare Other | Source: Skilled Nursing Facility | Attending: Family Medicine | Admitting: Family Medicine

## 2023-10-17 ENCOUNTER — Other Ambulatory Visit: Payer: Self-pay

## 2023-10-17 ENCOUNTER — Observation Stay (HOSPITAL_COMMUNITY): Payer: Medicare Other

## 2023-10-17 DIAGNOSIS — K2100 Gastro-esophageal reflux disease with esophagitis, without bleeding: Secondary | ICD-10-CM | POA: Diagnosis present

## 2023-10-17 DIAGNOSIS — L89312 Pressure ulcer of right buttock, stage 2: Secondary | ICD-10-CM | POA: Diagnosis present

## 2023-10-17 DIAGNOSIS — R1084 Generalized abdominal pain: Secondary | ICD-10-CM | POA: Diagnosis not present

## 2023-10-17 DIAGNOSIS — I69351 Hemiplegia and hemiparesis following cerebral infarction affecting right dominant side: Secondary | ICD-10-CM | POA: Diagnosis not present

## 2023-10-17 DIAGNOSIS — Z8249 Family history of ischemic heart disease and other diseases of the circulatory system: Secondary | ICD-10-CM

## 2023-10-17 DIAGNOSIS — N179 Acute kidney failure, unspecified: Secondary | ICD-10-CM | POA: Diagnosis not present

## 2023-10-17 DIAGNOSIS — E1143 Type 2 diabetes mellitus with diabetic autonomic (poly)neuropathy: Secondary | ICD-10-CM | POA: Diagnosis present

## 2023-10-17 DIAGNOSIS — K7581 Nonalcoholic steatohepatitis (NASH): Secondary | ICD-10-CM | POA: Diagnosis present

## 2023-10-17 DIAGNOSIS — R109 Unspecified abdominal pain: Secondary | ICD-10-CM | POA: Diagnosis not present

## 2023-10-17 DIAGNOSIS — R000 Tachycardia, unspecified: Secondary | ICD-10-CM | POA: Diagnosis not present

## 2023-10-17 DIAGNOSIS — E785 Hyperlipidemia, unspecified: Secondary | ICD-10-CM | POA: Diagnosis present

## 2023-10-17 DIAGNOSIS — J690 Pneumonitis due to inhalation of food and vomit: Secondary | ICD-10-CM | POA: Diagnosis not present

## 2023-10-17 DIAGNOSIS — K5641 Fecal impaction: Secondary | ICD-10-CM | POA: Diagnosis present

## 2023-10-17 DIAGNOSIS — K3184 Gastroparesis: Secondary | ICD-10-CM | POA: Diagnosis present

## 2023-10-17 DIAGNOSIS — N1831 Chronic kidney disease, stage 3a: Secondary | ICD-10-CM | POA: Diagnosis present

## 2023-10-17 DIAGNOSIS — K5900 Constipation, unspecified: Secondary | ICD-10-CM | POA: Diagnosis not present

## 2023-10-17 DIAGNOSIS — K2950 Unspecified chronic gastritis without bleeding: Secondary | ICD-10-CM | POA: Diagnosis not present

## 2023-10-17 DIAGNOSIS — Z66 Do not resuscitate: Secondary | ICD-10-CM | POA: Diagnosis present

## 2023-10-17 DIAGNOSIS — N400 Enlarged prostate without lower urinary tract symptoms: Secondary | ICD-10-CM | POA: Diagnosis present

## 2023-10-17 DIAGNOSIS — K920 Hematemesis: Principal | ICD-10-CM | POA: Diagnosis present

## 2023-10-17 DIAGNOSIS — I129 Hypertensive chronic kidney disease with stage 1 through stage 4 chronic kidney disease, or unspecified chronic kidney disease: Secondary | ICD-10-CM | POA: Diagnosis present

## 2023-10-17 DIAGNOSIS — R112 Nausea with vomiting, unspecified: Secondary | ICD-10-CM | POA: Diagnosis not present

## 2023-10-17 DIAGNOSIS — K5730 Diverticulosis of large intestine without perforation or abscess without bleeding: Secondary | ICD-10-CM | POA: Diagnosis not present

## 2023-10-17 DIAGNOSIS — Z794 Long term (current) use of insulin: Secondary | ICD-10-CM | POA: Diagnosis not present

## 2023-10-17 DIAGNOSIS — Z7982 Long term (current) use of aspirin: Secondary | ICD-10-CM

## 2023-10-17 DIAGNOSIS — Z79899 Other long term (current) drug therapy: Secondary | ICD-10-CM

## 2023-10-17 DIAGNOSIS — K922 Gastrointestinal hemorrhage, unspecified: Secondary | ICD-10-CM | POA: Diagnosis present

## 2023-10-17 DIAGNOSIS — D472 Monoclonal gammopathy: Secondary | ICD-10-CM | POA: Diagnosis present

## 2023-10-17 DIAGNOSIS — Z923 Personal history of irradiation: Secondary | ICD-10-CM

## 2023-10-17 DIAGNOSIS — E1140 Type 2 diabetes mellitus with diabetic neuropathy, unspecified: Secondary | ICD-10-CM | POA: Diagnosis not present

## 2023-10-17 DIAGNOSIS — Z87891 Personal history of nicotine dependence: Secondary | ICD-10-CM

## 2023-10-17 DIAGNOSIS — K3189 Other diseases of stomach and duodenum: Secondary | ICD-10-CM | POA: Diagnosis present

## 2023-10-17 DIAGNOSIS — H409 Unspecified glaucoma: Secondary | ICD-10-CM | POA: Diagnosis present

## 2023-10-17 DIAGNOSIS — E1122 Type 2 diabetes mellitus with diabetic chronic kidney disease: Secondary | ICD-10-CM | POA: Diagnosis present

## 2023-10-17 DIAGNOSIS — I10 Essential (primary) hypertension: Secondary | ICD-10-CM | POA: Diagnosis not present

## 2023-10-17 DIAGNOSIS — K2970 Gastritis, unspecified, without bleeding: Secondary | ICD-10-CM

## 2023-10-17 DIAGNOSIS — K219 Gastro-esophageal reflux disease without esophagitis: Secondary | ICD-10-CM | POA: Diagnosis not present

## 2023-10-17 DIAGNOSIS — M109 Gout, unspecified: Secondary | ICD-10-CM | POA: Diagnosis present

## 2023-10-17 DIAGNOSIS — R1111 Vomiting without nausea: Secondary | ICD-10-CM | POA: Diagnosis not present

## 2023-10-17 DIAGNOSIS — Z85118 Personal history of other malignant neoplasm of bronchus and lung: Secondary | ICD-10-CM

## 2023-10-17 DIAGNOSIS — L89222 Pressure ulcer of left hip, stage 2: Secondary | ICD-10-CM | POA: Diagnosis not present

## 2023-10-17 DIAGNOSIS — J45909 Unspecified asthma, uncomplicated: Secondary | ICD-10-CM | POA: Diagnosis present

## 2023-10-17 DIAGNOSIS — K317 Polyp of stomach and duodenum: Secondary | ICD-10-CM | POA: Diagnosis present

## 2023-10-17 DIAGNOSIS — K921 Melena: Secondary | ICD-10-CM | POA: Diagnosis not present

## 2023-10-17 DIAGNOSIS — D132 Benign neoplasm of duodenum: Secondary | ICD-10-CM

## 2023-10-17 DIAGNOSIS — Z860100 Personal history of colon polyps, unspecified: Secondary | ICD-10-CM

## 2023-10-17 DIAGNOSIS — Z85818 Personal history of malignant neoplasm of other sites of lip, oral cavity, and pharynx: Secondary | ICD-10-CM

## 2023-10-17 DIAGNOSIS — R54 Age-related physical debility: Secondary | ICD-10-CM | POA: Diagnosis present

## 2023-10-17 LAB — GLUCOSE, CAPILLARY
Glucose-Capillary: 268 mg/dL — ABNORMAL HIGH (ref 70–99)
Glucose-Capillary: 231 mg/dL — ABNORMAL HIGH (ref 70–99)
Glucose-Capillary: 193 mg/dL — ABNORMAL HIGH (ref 70–99)
Glucose-Capillary: 167 mg/dL — ABNORMAL HIGH (ref 70–99)

## 2023-10-17 LAB — COMPREHENSIVE METABOLIC PANEL
ALT: 16 U/L (ref 0–44)
AST: 15 U/L (ref 15–41)
Albumin: 2.7 g/dL — ABNORMAL LOW (ref 3.5–5.0)
Alkaline Phosphatase: 100 U/L (ref 38–126)
Anion gap: 12 (ref 5–15)
BUN: 50 mg/dL — ABNORMAL HIGH (ref 8–23)
CO2: 29 mmol/L (ref 22–32)
Calcium: 9.5 mg/dL (ref 8.9–10.3)
Chloride: 99 mmol/L (ref 98–111)
Creatinine, Ser: 2.17 mg/dL — ABNORMAL HIGH (ref 0.61–1.24)
GFR, Estimated: 31 mL/min — ABNORMAL LOW (ref >60)
Glucose, Bld: 295 mg/dL — ABNORMAL HIGH (ref 70–99)
Potassium: 4.6 mmol/L (ref 3.5–5.1)
Sodium: 140 mmol/L (ref 135–145)
Total Bilirubin: 0.3 mg/dL (ref <1.2)
Total Protein: 7.5 g/dL (ref 6.5–8.1)

## 2023-10-17 LAB — HEMOGLOBIN AND HEMATOCRIT, BLOOD
HCT: 35.3 % — ABNORMAL LOW (ref 39.0–52.0)
Hemoglobin: 10.2 g/dL — ABNORMAL LOW (ref 13.0–17.0)

## 2023-10-17 LAB — CBC
HCT: 31.7 % — ABNORMAL LOW (ref 39.0–52.0)
Hemoglobin: 9.3 g/dL — ABNORMAL LOW (ref 13.0–17.0)
MCH: 20.9 pg — ABNORMAL LOW (ref 26.0–34.0)
MCHC: 29.3 g/dL — ABNORMAL LOW (ref 30.0–36.0)
MCV: 71.2 fL — ABNORMAL LOW (ref 80.0–100.0)
Platelets: 439 10*3/uL — ABNORMAL HIGH (ref 150–400)
RBC: 4.45 MIL/uL (ref 4.22–5.81)
RDW: 21.4 % — ABNORMAL HIGH (ref 11.5–15.5)
WBC: 26.7 10*3/uL — ABNORMAL HIGH (ref 4.0–10.5)
nRBC: 0 % (ref 0.0–0.2)

## 2023-10-17 LAB — MRSA NEXT GEN BY PCR, NASAL: MRSA by PCR Next Gen: NOT DETECTED

## 2023-10-17 MED ORDER — BISACODYL 10 MG RE SUPP: 10.0000 mg | Freq: Every day | RECTAL | Status: DC | PRN

## 2023-10-17 MED ORDER — SODIUM CHLORIDE 0.9% FLUSH
3.0000 mL | Freq: Two times a day (BID) | INTRAVENOUS | Status: DC
  Administered 2023-10-17 – 2023-10-19 (×5): 3 mL via INTRAVENOUS

## 2023-10-17 MED ORDER — DM-GUAIFENESIN ER 30-600 MG PO TB12
1.0000 | ORAL_TABLET | Freq: Two times a day (BID) | ORAL | Status: DC
  Administered 2023-10-17 – 2023-10-20 (×5): 1 via ORAL
  Filled 2023-10-17 (×7): qty 1

## 2023-10-17 MED ORDER — METOPROLOL TARTRATE 50 MG PO TABS
50.0000 mg | ORAL_TABLET | Freq: Once | ORAL | Status: AC
  Administered 2023-10-17: 50 mg via ORAL
  Filled 2023-10-17: qty 1

## 2023-10-17 MED ORDER — BISACODYL 10 MG RE SUPP
10.0000 mg | Freq: Once | RECTAL | Status: AC
  Administered 2023-10-17: 10 mg via RECTAL
  Filled 2023-10-17: qty 1

## 2023-10-17 MED ORDER — SODIUM CHLORIDE 0.9 % IV SOLN
2.0000 g | INTRAVENOUS | Status: DC
  Administered 2023-10-18 – 2023-10-19 (×3): 2 g via INTRAVENOUS
  Filled 2023-10-17 (×3): qty 20

## 2023-10-17 MED ORDER — ALBUTEROL SULFATE (2.5 MG/3ML) 0.083% IN NEBU: 2.5000 mg | INHALATION_SOLUTION | RESPIRATORY_TRACT | Status: DC | PRN

## 2023-10-17 MED ORDER — SODIUM CHLORIDE 0.9 % IV SOLN: INTRAVENOUS | Status: AC

## 2023-10-17 MED ORDER — AMLODIPINE BESYLATE 5 MG PO TABS: 10.0000 mg | ORAL_TABLET | Freq: Every day | ORAL | Status: DC

## 2023-10-17 MED ORDER — ACETAMINOPHEN 650 MG RE SUPP: 650.0000 mg | Freq: Four times a day (QID) | RECTAL | Status: DC | PRN

## 2023-10-17 MED ORDER — ACETAMINOPHEN 325 MG PO TABS: 650.0000 mg | ORAL_TABLET | Freq: Four times a day (QID) | ORAL | Status: DC | PRN

## 2023-10-17 MED ORDER — SIMVASTATIN 20 MG PO TABS
20.0000 mg | ORAL_TABLET | Freq: Every day | ORAL | Status: DC
  Administered 2023-10-17 – 2023-10-20 (×4): 20 mg via ORAL
  Filled 2023-10-17: qty 1
  Filled 2023-10-17 (×2): qty 2
  Filled 2023-10-17: qty 1

## 2023-10-17 MED ORDER — SENNOSIDES-DOCUSATE SODIUM 8.6-50 MG PO TABS
2.0000 | ORAL_TABLET | Freq: Every day | ORAL | Status: DC
  Administered 2023-10-17 – 2023-10-19 (×3): 2 via ORAL
  Filled 2023-10-17 (×4): qty 2

## 2023-10-17 MED ORDER — PANTOPRAZOLE SODIUM 40 MG IV SOLR
40.0000 mg | Freq: Two times a day (BID) | INTRAVENOUS | Status: DC
  Administered 2023-10-17 – 2023-10-20 (×7): 40 mg via INTRAVENOUS
  Filled 2023-10-17 (×7): qty 10

## 2023-10-17 MED ORDER — CINACALCET HCL 30 MG PO TABS
30.0000 mg | ORAL_TABLET | Freq: Every day | ORAL | Status: DC
  Administered 2023-10-18 – 2023-10-20 (×2): 30 mg via ORAL
  Filled 2023-10-17 (×4): qty 1

## 2023-10-17 MED ORDER — GABAPENTIN 300 MG PO CAPS: 600.0000 mg | ORAL_CAPSULE | Freq: Two times a day (BID) | ORAL | Status: DC

## 2023-10-17 MED ORDER — SODIUM CHLORIDE 0.9 % IV SOLN: INTRAVENOUS | Status: AC | PRN

## 2023-10-17 MED ORDER — LACTATED RINGERS IV BOLUS
1000.0000 mL | Freq: Once | INTRAVENOUS | Status: AC
  Administered 2023-10-17: 1000 mL via INTRAVENOUS

## 2023-10-17 MED ORDER — PANTOPRAZOLE SODIUM 40 MG IV SOLR
40.0000 mg | Freq: Once | INTRAVENOUS | Status: AC
  Administered 2023-10-17: 40 mg via INTRAVENOUS
  Filled 2023-10-17: qty 10

## 2023-10-17 MED ORDER — OXYCODONE-ACETAMINOPHEN 7.5-325 MG PO TABS
1.0000 | ORAL_TABLET | ORAL | Status: DC | PRN
  Administered 2023-10-17 – 2023-10-20 (×4): 1 via ORAL
  Filled 2023-10-17 (×4): qty 1

## 2023-10-17 MED ORDER — SODIUM CHLORIDE 0.9% FLUSH
3.0000 mL | INTRAVENOUS | Status: DC | PRN
Start: 2023-10-17 — End: 2023-10-21

## 2023-10-17 MED ORDER — VITAMIN B-12 1000 MCG PO TABS
1000.0000 ug | ORAL_TABLET | Freq: Every day | ORAL | Status: DC
  Administered 2023-10-17 – 2023-10-20 (×3): 1000 ug via ORAL
  Filled 2023-10-17 (×4): qty 1

## 2023-10-17 MED ORDER — ONDANSETRON HCL 4 MG PO TABS
4.0000 mg | ORAL_TABLET | Freq: Four times a day (QID) | ORAL | Status: DC | PRN
  Filled 2023-10-17: qty 1

## 2023-10-17 MED ORDER — IPRATROPIUM-ALBUTEROL 0.5-2.5 (3) MG/3ML IN SOLN
3.0000 mL | Freq: Three times a day (TID) | RESPIRATORY_TRACT | Status: DC
  Administered 2023-10-17 – 2023-10-18 (×4): 3 mL via RESPIRATORY_TRACT
  Filled 2023-10-17 (×4): qty 3

## 2023-10-17 MED ORDER — ONDANSETRON HCL 4 MG/2ML IJ SOLN
4.0000 mg | Freq: Four times a day (QID) | INTRAMUSCULAR | Status: DC | PRN
  Administered 2023-10-17 (×2): 4 mg via INTRAVENOUS
  Filled 2023-10-17 (×2): qty 2

## 2023-10-17 MED ORDER — METOPROLOL TARTRATE 25 MG PO TABS
25.0000 mg | ORAL_TABLET | Freq: Two times a day (BID) | ORAL | Status: DC
  Administered 2023-10-17 – 2023-10-20 (×6): 25 mg via ORAL
  Filled 2023-10-17 (×7): qty 1

## 2023-10-17 MED ORDER — TAMSULOSIN HCL 0.4 MG PO CAPS
0.4000 mg | ORAL_CAPSULE | Freq: Every day | ORAL | Status: DC
  Administered 2023-10-17 – 2023-10-20 (×3): 0.4 mg via ORAL
  Filled 2023-10-17 (×4): qty 1

## 2023-10-17 MED ORDER — SODIUM CHLORIDE 0.9% FLUSH
3.0000 mL | Freq: Two times a day (BID) | INTRAVENOUS | Status: DC
  Administered 2023-10-17 – 2023-10-20 (×7): 3 mL via INTRAVENOUS

## 2023-10-17 MED ORDER — AMLODIPINE BESYLATE 5 MG PO TABS
10.0000 mg | ORAL_TABLET | Freq: Every day | ORAL | Status: DC
  Administered 2023-10-17 – 2023-10-20 (×3): 10 mg via ORAL
  Filled 2023-10-17 (×4): qty 2

## 2023-10-17 MED ORDER — DEXTROSE 5 % IV SOLN
500.0000 mg | INTRAVENOUS | Status: DC
  Administered 2023-10-18 – 2023-10-19 (×3): 500 mg via INTRAVENOUS
  Filled 2023-10-17 (×3): qty 5

## 2023-10-17 MED ORDER — INSULIN GLARGINE-YFGN 100 UNIT/ML ~~LOC~~ SOLN
8.0000 [IU] | Freq: Every day | SUBCUTANEOUS | Status: DC
  Administered 2023-10-17 – 2023-10-19 (×3): 8 [IU] via SUBCUTANEOUS
  Filled 2023-10-17 (×4): qty 0.08

## 2023-10-17 MED ORDER — POLYETHYLENE GLYCOL 3350 17 G PO PACK: 17.0000 g | PACK | Freq: Every day | ORAL | Status: DC

## 2023-10-17 MED ORDER — BACLOFEN 10 MG PO TABS
5.0000 mg | ORAL_TABLET | Freq: Three times a day (TID) | ORAL | Status: DC
  Administered 2023-10-17 – 2023-10-20 (×9): 5 mg via ORAL
  Filled 2023-10-17 (×12): qty 1

## 2023-10-17 MED ORDER — GABAPENTIN 300 MG PO CAPS
300.0000 mg | ORAL_CAPSULE | Freq: Two times a day (BID) | ORAL | Status: DC
  Administered 2023-10-17 – 2023-10-20 (×6): 300 mg via ORAL
  Filled 2023-10-17 (×7): qty 1

## 2023-10-17 MED ORDER — BUPROPION HCL ER (SR) 100 MG PO TB12
100.0000 mg | ORAL_TABLET | Freq: Two times a day (BID) | ORAL | Status: DC
  Administered 2023-10-17 – 2023-10-20 (×6): 100 mg via ORAL
  Filled 2023-10-17 (×8): qty 1

## 2023-10-17 MED ORDER — TRAZODONE HCL 50 MG PO TABS
100.0000 mg | ORAL_TABLET | Freq: Every day | ORAL | Status: DC
  Administered 2023-10-17 – 2023-10-19 (×3): 100 mg via ORAL
  Filled 2023-10-17 (×4): qty 2

## 2023-10-17 MED ORDER — POLYSACCHARIDE IRON COMPLEX 150 MG PO CAPS
150.0000 mg | ORAL_CAPSULE | Freq: Every day | ORAL | Status: DC
  Administered 2023-10-17 – 2023-10-20 (×3): 150 mg via ORAL
  Filled 2023-10-17 (×4): qty 1

## 2023-10-17 MED ORDER — CLONIDINE HCL 0.2 MG PO TABS
0.2000 mg | ORAL_TABLET | Freq: Two times a day (BID) | ORAL | Status: DC
  Administered 2023-10-17: 0.2 mg via ORAL
  Filled 2023-10-17: qty 1

## 2023-10-17 MED ORDER — POLYETHYLENE GLYCOL 3350 17 G PO PACK
17.0000 g | PACK | Freq: Two times a day (BID) | ORAL | Status: DC
  Administered 2023-10-17 – 2023-10-20 (×5): 17 g via ORAL
  Filled 2023-10-17 (×7): qty 1

## 2023-10-17 MED ORDER — ONDANSETRON HCL 4 MG/2ML IJ SOLN
4.0000 mg | Freq: Once | INTRAMUSCULAR | Status: AC
  Administered 2023-10-17: 4 mg via INTRAVENOUS
  Filled 2023-10-17: qty 2

## 2023-10-17 NOTE — H&P (View-Only) (Signed)
@LOGO @   Referring Provider: Dr. Glendora Score Primary Care Physician:  [name], DO Primary Gastroenterologist:  Dr. Jena Gauss  Date of Admission: 10/17/23 Date of Consultation: 10/17/23  Reason for Consultation: Coffee-ground emesis  HPI:  [name] is a 77 y.o. year old male with history of stroke with residual right-sided hemiparesis, hepatic steatosis, renal insufficiency, non-small cell lung cancer, lymphoma, MGUS, adenocarcinoma of the parotid gland, asthma, HTN, DM, Barrett's esophagus, EOE treated with steroids,  GERD, gastroparesis, hemorrhoids, colon polyps, chronic constipation, hemorrhagic colitis in 2013 presumed infectious, admitted in August 2023 with hematemesis found to have hemorrhagic gastric polyps and duodenal adenoma with low-grade dysplasia with recommendations to follow conservatively considering overall poor health, chronic right upper quadrant pain suspected to be neurogenic or MSK, chronic IDA with no recommendations to repeat endoscopic evaluation due to multiple comorbidities increasing risk of morbidity and mortality.   He presented to the ER per EMS from [facility] for N/V with coffee ground emesis starting this morning. Patient arrived with dried blood on his lips. He was tachycardic with heart rates in the 140s but blood pressures normal. Hgb 9.3 (down from 10.1, 5 months ago), WBC 26.7, glucose 295, BUN 50 (chronic), Cr 2.17 (close to baseline)He was given 1L IV fluids and Protonix IV, and zofran.   Consult:  Reports he was in his usual state of health until he developed new onset nausea and vomiting early morning hours this morning. Associated hematemesis and  coffee ground emesis as well as generalized abdominal pain, worse compared to baseline. Unable to tell me when his last BM was. History from patient is limited due to limited speech related to prior stroke. He has not had any recurrent emesis since being in the room in the ER per ER nurse,  Cranston Neighbor.   Spoke nurse at the facility: Last BM: Not sure.  Anticoagulants: None.  Reports excessive emesis overnight with about 5 episodes. Unable to tell me about hematemesis/coffee ground emesis.  She states after returning from oncology visit on 10-31, oncologst recommended continued comfort care at [facility].  Reviewed office visit with patient's oncologist, Dr. Shirline Frees, 10/31.  States, "Given patient's overall condition, aggressive treatment is not feasible.  No further follow-up visits planned."    Last EGD 07/15/2022: - Normal esophagus.  - Multiple gastric polyps. 1 large hemorrhagic polyp straddling the diaphragmatic hiatus removed with hot snare cautery; polypectomy site clipped  - Markedly inflamed appearing gastric mucosa - status post biopsy.  - Duodenal polypoid mass? status post biopsy -Pathology showed duodenal adenoma with low-grade dysplasia, mild nonspecific reactive gastropathy, negative for H. Pylori, hyperplastic gastric polyp with ulceration.  Colonoscopy attempted in 2017 but incomplete due to inadequate prep.   Last complete colonoscopy in 2012 with pancolonic diverticula, hemorrhoids, tubular adenoma.   Past Medical History:  Diagnosis Date   Arthritis    Asthma    "hx of"   Barrett's esophagus    EGD 03/23/2011 & EGD 2/09 bx proven   BPH (benign prostatic hyperplasia)    Cancer of parotid gland (HCC) 11/23/12   Adenocarcinoma   Chronic abdominal pain    Chronic constipation    Colon polyp 03/23/2011   tubular adenoma, Dr. Jena Gauss   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) 07/15/2011   CVA (cerebral infarction) 1998   right sided deficit   Delayed gastric emptying 2018   Diverticulosis    TCS 03/23/11 pancolonic diverticula &TCS 5/08, pancolonic diverticula   DM (diabetes mellitus) (HCC)  Edema of lower extremity 12/21/12   bilateral    Esotropia of left eye    GERD (gastroesophageal reflux disease)    Glaucoma (increased eye pressure)     Gout    Gout    Hemorrhagic colitis 06/06/2012.   Hemorrhoids, internal 03/23/2011   tcs by Dr. Jena Gauss   Hepatitis    esosiniphilic, tx with prednisone   Hiatal hernia    History of radiation therapy 05/21/18- 05/30/18   Left Lung/ 54 Gy delivered in 3 fractions of 18 Gy. SBRT   HTN (hypertension)    Hx of radiation therapy 1974   right base of skull area-lymphoma   Hyperlipidemia    Lower facial weakness    Right   Lymphoma (HCC) 1974   XRT at [facility], right base of skull area   Neuropathy    Non-small cell lung cancer (HCC) dx'd 04/26/18   Peripheral edema    R>L legs   Rash    chronic, recurrent, R>L legs   Renal insufficiency    Steatohepatitis    liver biopsy 2009   Stroke [facility] 1998   right hemiparesis/plegia    Past Surgical History:  Procedure Laterality Date   BIOPSY  12/01/2016   Procedure: BIOPSY;  Surgeon: Corbin Ade, MD;  Location: [facility];  Service: Endoscopy;;  duodenum, gastric, esophagus   BIOPSY  07/15/2022   Procedure: BIOPSY;  Surgeon: Corbin Ade, MD;  Location: [facility];  Service: Endoscopy;;  duodenal, mass;   CHOLECYSTECTOMY     COLONOSCOPY  03/23/11   Dr. Jena Gauss  pancolonic diverticula, hemorrhoids, tubular adenoma.. next tcs 03/2016   COLONOSCOPY WITH PROPOFOL N/A 12/01/2016   inadequate bowel prep precluded exam   ESOPHAGOGASTRODUODENOSCOPY  02/05/08   goblet cell metaplasia/negative for H.pylori   ESOPHAGOGASTRODUODENOSCOPY  03/23/11   Dr. Jena Gauss, barretts, hiatal hernia   ESOPHAGOGASTRODUODENOSCOPY (EGD) WITH PROPOFOL N/A 12/01/2016   Dr. Jena Gauss: Large amount of retained gastric contents precluded completion of the stomach. Mucosal changes were found in the stomach. Erosions and somewhat scalloped appearing mucosa present, reactive gastritis/no H pylori. Barrett's esophagus noted, no dysplasia on biopsy. Duodenal biopsies taken as well, benign, no evidence of eosinophilia.   ESOPHAGOGASTRODUODENOSCOPY (EGD) WITH PROPOFOL N/A 07/15/2022    Procedure: ESOPHAGOGASTRODUODENOSCOPY (EGD) WITH PROPOFOL;  Surgeon: Corbin Ade, MD;  [facility]: [facility];  Service: Endoscopy;  Laterality: N/A;   IR FLUORO GUIDED NEEDLE PLC ASPIRATION/INJECTION LOC  12/13/2018   IR KYPHO LUMBAR INC FX REDUCE BONE BX UNI/BIL CANNULATION INC/IMAGING  12/13/2018   IR RADIOLOGY PERIPHERAL GUIDED IV START  12/13/2018   IR US GUIDE VASC ACCESS LEFT  12/13/2018   MASS BIOPSY  11/01/2012   Procedure: NECK MASS BIOPSY;  Surgeon: Darletta Moll, MD;  Location: [facility];  Service: ENT;  Laterality: Right;  Excisional Bx Right Neck Mass; attempted external jugular cutdown of left side   PAROTIDECTOMY  11/24/2012   Procedure: PAROTIDECTOMY;  Surgeon: Darletta Moll, MD;  Location: [facility] OR;  Service: ENT;  Laterality: N/A;  Total parotidectomy   PLEURECTOMY     right lymph node removal Right    behind right ear   Right video-assisted thoracic surgery, pleurectomy, and pleurodesis  2011   VIDEO BRONCHOSCOPY WITH ENDOBRONCHIAL NAVIGATION N/A 04/26/2018   Procedure: VIDEO BRONCHOSCOPY WITH ENDOBRONCHIAL NAVIGATION;  Surgeon: Loreli Slot, MD;  Location: [facility] OR;  Service: Thoracic;  Laterality: N/A;    Prior to Admission medications   Medication Sig Start Date End  Date Taking? Authorizing Provider  allopurinol (ZYLOPRIM) 300 MG tablet TAKE 1 TABLET(300 MG) BY MOUTH DAILY Patient taking differently: Take 300 mg by mouth daily. TAKE 1 TABLET(300 MG) BY MOUTH DAILY 12/10/21   Donita Brooks, MD  amLODipine (NORVASC) 10 MG tablet Take 1 tablet (10 mg total) by mouth daily. 12/10/21   Donita Brooks, MD  ASPIRIN ADULT LOW STRENGTH 81 MG tablet Take 1 tablet (81 mg total) by mouth daily. Restart in 1 week 07/17/22   Erick Blinks, MD  b complex-vitamin c-folic acid (NEPHRO-VITE) 0.8 MG TABS tablet Take 1 tablet by mouth at bedtime.    [provider]  Baclofen 5 MG TABS Take 1 tablet by mouth 3 (three) times daily. 08/22/22   [provider]  buPROPion ER  (WELLBUTRIN SR) 100 MG 12 hr tablet Take 100 mg by mouth 2 (two) times daily. 02/03/22   [provider]  cinacalcet (SENSIPAR) 30 MG tablet Take 30 mg by mouth daily with breakfast. 02/03/22   [provider]  cloNIDine (CATAPRES) 0.2 MG tablet Take 1 tablet (0.2 mg total) by mouth 3 (three) times daily. TAKE 1 TABLET(0.2 MG) BY MOUTH THREE TIMES DAILY 12/10/21   Donita Brooks, MD  cyanocobalamin (VITAMIN B12) 1000 MCG tablet Take 1 tablet (1,000 mcg total) by mouth daily. 07/17/22   Erick Blinks, MD  gabapentin (NEURONTIN) 300 MG capsule Take 2 capsules (600 mg total) by mouth 2 (two) times daily. May also take 2 capsules (600 mg total) daily as needed. Patient taking differently: 300 MG Twice a day 12/10/21   Donita Brooks, MD  glucose blood [facility] VERIO) test strip Use as directed to check Blood Glucose twice daily 11/25/20   Dani Gobble, NP  insulin glargine (LANTUS SOLOSTAR) 100 UNIT/ML Solostar Pen Inject 30 Units into the skin at bedtime. Patient taking differently: Inject 15 Units into the skin at bedtime. 03/04/21   Dani Gobble, NP  iron polysaccharides (NIFEREX) 150 MG capsule TAKE 1 CAPSULE(150 MG) BY MOUTH DAILY Patient taking differently: Take 150 mg by mouth daily. 07/07/21   Donita Brooks, MD  Lancets (ONETOUCH DELICA PLUS LANCET33G) MISC USE TO TEST BLOOD SUGAR FOUR TIMES DAILY 07/28/21   Roma Kayser, MD  lisinopril (ZESTRIL) 20 MG tablet TAKE 1 TABLET(20 MG) BY MOUTH DAILY Patient taking differently: Take 20 mg by mouth daily. 06/18/21   Donita Brooks, MD  metoprolol tartrate (LOPRESSOR) 50 MG tablet Take 50 mg by mouth daily. 12/10/21   [provider]  oxyCODONE-acetaminophen (PERCOCET) 7.5-325 MG tablet Take 1 tablet by mouth every 4 (four) hours as needed. 08/10/21   Donita Brooks, MD  pantoprazole (PROTONIX) 40 MG tablet TAKE 1 TABLET(40 MG) BY MOUTH DAILY Patient taking differently: Take 40 mg by mouth daily.  TAKE 1 TABLET(40 MG) BY MOUTH DAILY 05/21/21   Donita Brooks, MD  polyethylene glycol powder (MIRALAX) 17 GM/SCOOP powder Take one capful daily. Hold for diarrhea. 08/24/22   Tiffany Kocher, PA-C  Prucalopride Succinate (MOTEGRITY) 2 MG TABS Take 1 tablet (2 mg total) by mouth daily. 11/22/22   Rourk, Gerrit Friends, MD  simvastatin (ZOCOR) 20 MG tablet TAKE 1 TABLET(20 MG) BY MOUTH AT BEDTIME Patient taking differently: Take 20 mg by mouth daily at 6 PM. 05/21/21   Donita Brooks, MD  tamsulosin (FLOMAX) 0.4 MG CAPS capsule TAKE 1 CAPSULE(0.4 MG) BY MOUTH DAILY Patient taking differently: Take 0.4 mg by mouth daily. 12/10/21  Donita Brooks, MD  traZODone (DESYREL) 100 MG tablet Take 100 mg by mouth at bedtime. 03/07/22   [provider]    No current facility-administered medications for this encounter.   Current Outpatient Medications  Medication Sig Dispense Refill   allopurinol (ZYLOPRIM) 300 MG tablet TAKE 1 TABLET(300 MG) BY MOUTH DAILY (Patient taking differently: Take 300 mg by mouth daily. TAKE 1 TABLET(300 MG) BY MOUTH DAILY) 90 tablet 2   amLODipine (NORVASC) 10 MG tablet Take 1 tablet (10 mg total) by mouth daily. 90 tablet 2   ASPIRIN ADULT LOW STRENGTH 81 MG tablet Take 1 tablet (81 mg total) by mouth daily. Restart in 1 week 30 tablet 12   b complex-vitamin c-folic acid (NEPHRO-VITE) 0.8 MG TABS tablet Take 1 tablet by mouth at bedtime.     Baclofen 5 MG TABS Take 1 tablet by mouth 3 (three) times daily.     buPROPion ER (WELLBUTRIN SR) 100 MG 12 hr tablet Take 100 mg by mouth 2 (two) times daily.     cinacalcet (SENSIPAR) 30 MG tablet Take 30 mg by mouth daily with breakfast.     cloNIDine (CATAPRES) 0.2 MG tablet Take 1 tablet (0.2 mg total) by mouth 3 (three) times daily. TAKE 1 TABLET(0.2 MG) BY MOUTH THREE TIMES DAILY 270 tablet 2   cyanocobalamin (VITAMIN B12) 1000 MCG tablet Take 1 tablet (1,000 mcg total) by mouth daily.     gabapentin (NEURONTIN) 300 MG  capsule Take 2 capsules (600 mg total) by mouth 2 (two) times daily. May also take 2 capsules (600 mg total) daily as needed. (Patient taking differently: 300 MG Twice a day) 540 capsule 3   glucose blood (ONETOUCH VERIO) test strip Use as directed to check Blood Glucose twice daily 150 strip 5   insulin glargine (LANTUS SOLOSTAR) 100 UNIT/ML Solostar Pen Inject 30 Units into the skin at bedtime. (Patient taking differently: Inject 15 Units into the skin at bedtime.) 15 mL 2   iron polysaccharides (NIFEREX) 150 MG capsule TAKE 1 CAPSULE(150 MG) BY MOUTH DAILY (Patient taking differently: Take 150 mg by mouth daily.) 90 capsule 2   Lancets (ONETOUCH DELICA PLUS LANCET33G) MISC USE TO TEST BLOOD SUGAR FOUR TIMES DAILY 200 each 2   lisinopril (ZESTRIL) 20 MG tablet TAKE 1 TABLET(20 MG) BY MOUTH DAILY (Patient taking differently: Take 20 mg by mouth daily.) 90 tablet 2   metoprolol tartrate (LOPRESSOR) 50 MG tablet Take 50 mg by mouth daily.     oxyCODONE-acetaminophen (PERCOCET) 7.5-325 MG tablet Take 1 tablet by mouth every 4 (four) hours as needed. 180 tablet 0   pantoprazole (PROTONIX) 40 MG tablet TAKE 1 TABLET(40 MG) BY MOUTH DAILY (Patient taking differently: Take 40 mg by mouth daily. TAKE 1 TABLET(40 MG) BY MOUTH DAILY) 90 tablet 2   polyethylene glycol powder (MIRALAX) 17 GM/SCOOP powder Take one capful daily. Hold for diarrhea. 527 g 5   Prucalopride Succinate (MOTEGRITY) 2 MG TABS Take 1 tablet (2 mg total) by mouth daily. 30 tablet 11   simvastatin (ZOCOR) 20 MG tablet TAKE 1 TABLET(20 MG) BY MOUTH AT BEDTIME (Patient taking differently: Take 20 mg by mouth daily at 6 PM.) 90 tablet 2   tamsulosin (FLOMAX) 0.4 MG CAPS capsule TAKE 1 CAPSULE(0.4 MG) BY MOUTH DAILY (Patient taking differently: Take 0.4 mg by mouth daily.) 90 capsule 2   traZODone (DESYREL) 100 MG tablet Take 100 mg by mouth at bedtime.      Allergies as of  10/17/2023   (No Known Allergies)    Family History  Problem  Relation Age of Onset   Heart failure Mother    Heart failure Father    Heart failure Sister    Heart failure Son    Colon cancer Neg Hx     Social History   Socioeconomic History   Marital status: Married    Spouse name: Not on file   Number of children: 1   Years of education: Not on file   Highest education level: Not on file  Occupational History   Occupation: disabled  Tobacco Use   Smoking status: Former    Current packs/day: 0.00    Average packs/day: 3.0 packs/day for 25.0 years (75.0 ttl pk-yrs)    Types: Cigarettes    Start date: 03/01/1972    Quit date: 03/01/1997    Years since quitting: 26.6   Smokeless tobacco: Never  Vaping Use   Vaping status: Never Used  Substance and Sexual Activity   Alcohol use: No   Drug use: No   Sexual activity: Not Currently  Other Topics Concern   Not on file  Social History Narrative   Not on file   Social Determinants of Health   Financial Resource Strain: Low Risk  (05/29/2020)   Overall Financial Resource Strain (CARDIA)    Difficulty of Paying Living Expenses: Not very hard  Food Insecurity: Not on file  Transportation Needs: Not on file  Physical Activity: Not on file  Stress: Not on file  Social Connections: Not on file  Intimate Partner Violence: Not At Risk (08/31/2018)   Humiliation, Afraid, Rape, and Kick questionnaire    Fear of Current or Ex-Partner: No    Emotionally Abused: No    Physically Abused: No    Sexually Abused: No    Review of Systems: Gen: Denies fever, chills, cold or flulike symptoms. CV: Denies chest pain, heart palpitations. Resp: Denies shortness of breath. GI: See HPI Heme: See HPI  Physical Exam: Vital signs in last 24 hours: Temp:  [98.6 F (37 C)] 98.6 F (37 C) (11/05 0641) Pulse Rate:  [138-142] 138 (11/05 0743) Resp:  [18-23] 21 (11/05 0743) BP: (121-145)/(95-106) 121/97 (11/05 0743) SpO2:  [97 %] 97 % (11/05 0743)   General:   Frail, chronically ill-appearing, right  sided hemiparesis Head:  Normocephalic and atraumatic. Eyes:  Sclera clear, no icterus.   Conjunctiva pink. Ears:  Normal auditory acuity. Lungs:  Clear throughout to auscultation anteriorly.  No wheezes, crackles, or rhonchi. No acute distress. Heart:  Tachycardic but regular rhythm. No murmurs.  Abdomen:  Soft and nondistended. Generalized tenderness to palpation.  Hypoactive bowel sounds. Without guarding, and without rebound.   Rectal:  Deferred   Skin:  Intact without significant lesions or rashes. Psych:  Normal mood and affect.  Intake/Output from previous day: No intake/output data recorded. Intake/Output this shift: No intake/output data recorded.  Lab Results: Recent Labs    10/17/23 0731  WBC 26.7*  HGB 9.3*  HCT 31.7*  PLT 439*   BMET Recent Labs    10/17/23 0731  NA 140  K 4.6  CL 99  CO2 29  GLUCOSE 295*  BUN 50*  CREATININE 2.17*  CALCIUM 9.5   LFT Recent Labs    10/17/23 0731  PROT 7.5  ALBUMIN 2.7*  AST 15  ALT 16  ALKPHOS 100  BILITOT 0.3    Impression: 77 y.o. year old male with history of stroke with residual right-sided hemiparesis,  hepatic steatosis, renal insufficiency, non-small cell lung cancer, lymphoma, MGUS, adenocarcinoma of the parotid gland, asthma, HTN, DM, CKD, Barrett's esophagus, EOE treated with steroids,  GERD, gastroparesis, hemorrhoids, colon polyps, chronic constipation, hemorrhagic colitis in 2013 presumed infectious, admitted in August 2023 with hematemesis found to have hemorrhagic gastric polyps and duodenal adenoma with low-grade dysplasia with recommendations to follow conservatively considering overall poor health, chronic right upper quadrant pain suspected to be neurogenic or MSK, chronic IDA with no recommendations to repeat endoscopic evaluation due to multiple comorbidities increasing risk of morbidity and mortality, now presenting to the emergency room per EMS from [facility] for nausea, vomiting,  coffee-ground emesis starting early this morning. Patient also endorsing abdominal pain.   Upon presenting to the ER, patient was tachycardic with heart rate into the 140s, BP initially normal, but has become hypertensive.  Hemoglobin down to 9.3, previously 10.1 about 5 months ago.  Also white blood cell count elevated at 26.7.  LFTs within normal limits.  BUN is elevated at 50, but this is chronic and close to baseline in the setting of CKD, creatinine 2.17.  No recurrent hematemesis in the last couple of hours.  Nausea improved by Zofran. Continues with abdominal pain.  Etiology of nausea/vomiting/hematemesis is unclear. Differential is broad including recurrent hemorrhagic gastric polyp, bleeding duodenal adenoma versus progression to malignancy, gastric outlet obstruction, gastritis, duodenitis, PUD, Mallory-Weiss tear.  Considering hypoactive bowel sounds and inability to find last documented bowel movement at nursing facility, unable to rule out bowel obstruction.  We will obtain CT abdomen pelvis to further evaluate his abdominal pain.  If no acute findings, would need to consider EGD once tachycardia and hypertension have improved.   Considering patient's overall frailty, miltimorbidity, and considering oncology has recommended no further management/work-up, would be a good idea to get a palliative consult to outline goals of care.  Plan: Keep NPO CT A/P without contrast stat. IV Protonix 40 mg twice daily Zofran PRN. Would recommend scheduling Zofran every 6 hours if recurrent emesis.  Monitor for recurrent hematemesis/coffee-ground emesis.   Monitor H&H closely.  Transfuse for hemoglobin less than 7. Management of tachycardia and hypertension per hospitalist. Consider EGD once hemodynamically stable. Would benefit from palliative consult.     LOS: 0 days    10/17/2023, 8:23 AM   Ermalinda Memos, PA-C [facility]

## 2023-10-17 NOTE — Consult Note (Addendum)
@LOGO @   Referring Provider: Dr. Glendora Score Primary Care Physician:  [name], DO Primary Gastroenterologist:  Dr. Jena Gauss  Date of Admission: 10/17/23 Date of Consultation: 10/17/23  Reason for Consultation: Coffee-ground emesis  HPI:  [name] is a 77 y.o. year old male with history of stroke with residual right-sided hemiparesis, hepatic steatosis, renal insufficiency, non-small cell lung cancer, lymphoma, MGUS, adenocarcinoma of the parotid gland, asthma, HTN, DM, Barrett's esophagus, EOE treated with steroids,  GERD, gastroparesis, hemorrhoids, colon polyps, chronic constipation, hemorrhagic colitis in 2013 presumed infectious, admitted in August 2023 with hematemesis found to have hemorrhagic gastric polyps and duodenal adenoma with low-grade dysplasia with recommendations to follow conservatively considering overall poor health, chronic right upper quadrant pain suspected to be neurogenic or MSK, chronic IDA with no recommendations to repeat endoscopic evaluation due to multiple comorbidities increasing risk of morbidity and mortality.   He presented to the ER per EMS from [facility] for N/V with coffee ground emesis starting this morning. Patient arrived with dried blood on his lips. He was tachycardic with heart rates in the 140s but blood pressures normal. Hgb 9.3 (down from 10.1, 5 months ago), WBC 26.7, glucose 295, BUN 50 (chronic), Cr 2.17 (close to baseline)He was given 1L IV fluids and Protonix IV, and zofran.   Consult:  Reports he was in his usual state of health until he developed new onset nausea and vomiting early morning hours this morning. Associated hematemesis and  coffee ground emesis as well as generalized abdominal pain, worse compared to baseline. Unable to tell me when his last BM was. History from patient is limited due to limited speech related to prior stroke. He has not had any recurrent emesis since being in the room in the ER per ER nurse,  Cranston Neighbor.   Spoke nurse at the facility: Last BM: Not sure.  Anticoagulants: None.  Reports excessive emesis overnight with about 5 episodes. Unable to tell me about hematemesis/coffee ground emesis.  She states after returning from oncology visit on 10-31, oncologst recommended continued comfort care at [facility].  Reviewed office visit with patient's oncologist, Dr. Shirline Frees, 10/31.  States, "Given patient's overall condition, aggressive treatment is not feasible.  No further follow-up visits planned."    Last EGD 07/15/2022: - Normal esophagus.  - Multiple gastric polyps. 1 large hemorrhagic polyp straddling the diaphragmatic hiatus removed with hot snare cautery; polypectomy site clipped  - Markedly inflamed appearing gastric mucosa - status post biopsy.  - Duodenal polypoid mass? status post biopsy -Pathology showed duodenal adenoma with low-grade dysplasia, mild nonspecific reactive gastropathy, negative for H. Pylori, hyperplastic gastric polyp with ulceration.  Colonoscopy attempted in 2017 but incomplete due to inadequate prep.   Last complete colonoscopy in 2012 with pancolonic diverticula, hemorrhoids, tubular adenoma.   Past Medical History:  Diagnosis Date   Arthritis    Asthma    "hx of"   Barrett's esophagus    EGD 03/23/2011 & EGD 2/09 bx proven   BPH (benign prostatic hyperplasia)    Cancer of parotid gland (HCC) 11/23/12   Adenocarcinoma   Chronic abdominal pain    Chronic constipation    Colon polyp 03/23/2011   tubular adenoma, Dr. Jena Gauss   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) 07/15/2011   CVA (cerebral infarction) 1998   right sided deficit   Delayed gastric emptying 2018   Diverticulosis    TCS 03/23/11 pancolonic diverticula &TCS 5/08, pancolonic diverticula   DM (diabetes mellitus) (HCC)  Edema of lower extremity 12/21/12   bilateral    Esotropia of left eye    GERD (gastroesophageal reflux disease)    Glaucoma (increased eye pressure)     Gout    Gout    Hemorrhagic colitis 06/06/2012.   Hemorrhoids, internal 03/23/2011   tcs by Dr. Jena Gauss   Hepatitis    esosiniphilic, tx with prednisone   Hiatal hernia    History of radiation therapy 05/21/18- 05/30/18   Left Lung/ 54 Gy delivered in 3 fractions of 18 Gy. SBRT   HTN (hypertension)    Hx of radiation therapy 1974   right base of skull area-lymphoma   Hyperlipidemia    Lower facial weakness    Right   Lymphoma (HCC) 1974   XRT at [facility], right base of skull area   Neuropathy    Non-small cell lung cancer (HCC) dx'd 04/26/18   Peripheral edema    R>L legs   Rash    chronic, recurrent, R>L legs   Renal insufficiency    Steatohepatitis    liver biopsy 2009   Stroke [facility] 1998   right hemiparesis/plegia    Past Surgical History:  Procedure Laterality Date   BIOPSY  12/01/2016   Procedure: BIOPSY;  Surgeon: Corbin Ade, MD;  Location: [facility];  Service: Endoscopy;;  duodenum, gastric, esophagus   BIOPSY  07/15/2022   Procedure: BIOPSY;  Surgeon: Corbin Ade, MD;  Location: [facility];  Service: Endoscopy;;  duodenal, mass;   CHOLECYSTECTOMY     COLONOSCOPY  03/23/11   Dr. Jena Gauss  pancolonic diverticula, hemorrhoids, tubular adenoma.. next tcs 03/2016   COLONOSCOPY WITH PROPOFOL N/A 12/01/2016   inadequate bowel prep precluded exam   ESOPHAGOGASTRODUODENOSCOPY  02/05/08   goblet cell metaplasia/negative for H.pylori   ESOPHAGOGASTRODUODENOSCOPY  03/23/11   Dr. Jena Gauss, barretts, hiatal hernia   ESOPHAGOGASTRODUODENOSCOPY (EGD) WITH PROPOFOL N/A 12/01/2016   Dr. Jena Gauss: Large amount of retained gastric contents precluded completion of the stomach. Mucosal changes were found in the stomach. Erosions and somewhat scalloped appearing mucosa present, reactive gastritis/no H pylori. Barrett's esophagus noted, no dysplasia on biopsy. Duodenal biopsies taken as well, benign, no evidence of eosinophilia.   ESOPHAGOGASTRODUODENOSCOPY (EGD) WITH PROPOFOL N/A 07/15/2022    Procedure: ESOPHAGOGASTRODUODENOSCOPY (EGD) WITH PROPOFOL;  Surgeon: Corbin Ade, MD;  [facility];  Service: Endoscopy;  Laterality: N/A;   IR FLUORO GUIDED NEEDLE PLC ASPIRATION/INJECTION LOC  12/13/2018   IR KYPHO LUMBAR INC FX REDUCE BONE BX UNI/BIL CANNULATION INC/IMAGING  12/13/2018   IR RADIOLOGY PERIPHERAL GUIDED IV START  12/13/2018   IR US GUIDE VASC ACCESS LEFT  12/13/2018   MASS BIOPSY  11/01/2012   Procedure: NECK MASS BIOPSY;  Surgeon: Darletta Moll, MD;  Location: [facility];  Service: ENT;  Laterality: Right;  Excisional Bx Right Neck Mass; attempted external jugular cutdown of left side   PAROTIDECTOMY  11/24/2012   Procedure: PAROTIDECTOMY;  Surgeon: Darletta Moll, MD;  Location: [facility] OR;  Service: ENT;  Laterality: N/A;  Total parotidectomy   PLEURECTOMY     right lymph node removal Right    behind right ear   Right video-assisted thoracic surgery, pleurectomy, and pleurodesis  2011   VIDEO BRONCHOSCOPY WITH ENDOBRONCHIAL NAVIGATION N/A 04/26/2018   Procedure: VIDEO BRONCHOSCOPY WITH ENDOBRONCHIAL NAVIGATION;  Surgeon: Loreli Slot, MD;  Location: [facility] OR;  Service: Thoracic;  Laterality: N/A;    Prior to Admission medications   Medication Sig Start Date End  Date Taking? Authorizing Provider  allopurinol (ZYLOPRIM) 300 MG tablet TAKE 1 TABLET(300 MG) BY MOUTH DAILY Patient taking differently: Take 300 mg by mouth daily. TAKE 1 TABLET(300 MG) BY MOUTH DAILY 12/10/21   Donita Brooks, MD  amLODipine (NORVASC) 10 MG tablet Take 1 tablet (10 mg total) by mouth daily. 12/10/21   Donita Brooks, MD  ASPIRIN ADULT LOW STRENGTH 81 MG tablet Take 1 tablet (81 mg total) by mouth daily. Restart in 1 week 07/17/22   Erick Blinks, MD  b complex-vitamin c-folic acid (NEPHRO-VITE) 0.8 MG TABS tablet Take 1 tablet by mouth at bedtime.    [provider]  Baclofen 5 MG TABS Take 1 tablet by mouth 3 (three) times daily. 08/22/22   [provider]  buPROPion ER  (WELLBUTRIN SR) 100 MG 12 hr tablet Take 100 mg by mouth 2 (two) times daily. 02/03/22   [provider]  cinacalcet (SENSIPAR) 30 MG tablet Take 30 mg by mouth daily with breakfast. 02/03/22   [provider]  cloNIDine (CATAPRES) 0.2 MG tablet Take 1 tablet (0.2 mg total) by mouth 3 (three) times daily. TAKE 1 TABLET(0.2 MG) BY MOUTH THREE TIMES DAILY 12/10/21   Donita Brooks, MD  cyanocobalamin (VITAMIN B12) 1000 MCG tablet Take 1 tablet (1,000 mcg total) by mouth daily. 07/17/22   Erick Blinks, MD  gabapentin (NEURONTIN) 300 MG capsule Take 2 capsules (600 mg total) by mouth 2 (two) times daily. May also take 2 capsules (600 mg total) daily as needed. Patient taking differently: 300 MG Twice a day 12/10/21   Donita Brooks, MD  glucose blood [facility] VERIO) test strip Use as directed to check Blood Glucose twice daily 11/25/20   Dani Gobble, NP  insulin glargine (LANTUS SOLOSTAR) 100 UNIT/ML Solostar Pen Inject 30 Units into the skin at bedtime. Patient taking differently: Inject 15 Units into the skin at bedtime. 03/04/21   Dani Gobble, NP  iron polysaccharides (NIFEREX) 150 MG capsule TAKE 1 CAPSULE(150 MG) BY MOUTH DAILY Patient taking differently: Take 150 mg by mouth daily. 07/07/21   Donita Brooks, MD  Lancets (ONETOUCH DELICA PLUS LANCET33G) MISC USE TO TEST BLOOD SUGAR FOUR TIMES DAILY 07/28/21   Roma Kayser, MD  lisinopril (ZESTRIL) 20 MG tablet TAKE 1 TABLET(20 MG) BY MOUTH DAILY Patient taking differently: Take 20 mg by mouth daily. 06/18/21   Donita Brooks, MD  metoprolol tartrate (LOPRESSOR) 50 MG tablet Take 50 mg by mouth daily. 12/10/21   [provider]  oxyCODONE-acetaminophen (PERCOCET) 7.5-325 MG tablet Take 1 tablet by mouth every 4 (four) hours as needed. 08/10/21   Donita Brooks, MD  pantoprazole (PROTONIX) 40 MG tablet TAKE 1 TABLET(40 MG) BY MOUTH DAILY Patient taking differently: Take 40 mg by mouth daily.  TAKE 1 TABLET(40 MG) BY MOUTH DAILY 05/21/21   Donita Brooks, MD  polyethylene glycol powder (MIRALAX) 17 GM/SCOOP powder Take one capful daily. Hold for diarrhea. 08/24/22   Tiffany Kocher, PA-C  Prucalopride Succinate (MOTEGRITY) 2 MG TABS Take 1 tablet (2 mg total) by mouth daily. 11/22/22   Rourk, Gerrit Friends, MD  simvastatin (ZOCOR) 20 MG tablet TAKE 1 TABLET(20 MG) BY MOUTH AT BEDTIME Patient taking differently: Take 20 mg by mouth daily at 6 PM. 05/21/21   Donita Brooks, MD  tamsulosin (FLOMAX) 0.4 MG CAPS capsule TAKE 1 CAPSULE(0.4 MG) BY MOUTH DAILY Patient taking differently: Take 0.4 mg by mouth daily. 12/10/21  Donita Brooks, MD  traZODone (DESYREL) 100 MG tablet Take 100 mg by mouth at bedtime. 03/07/22   [provider]    No current facility-administered medications for this encounter.   Current Outpatient Medications  Medication Sig Dispense Refill   allopurinol (ZYLOPRIM) 300 MG tablet TAKE 1 TABLET(300 MG) BY MOUTH DAILY (Patient taking differently: Take 300 mg by mouth daily. TAKE 1 TABLET(300 MG) BY MOUTH DAILY) 90 tablet 2   amLODipine (NORVASC) 10 MG tablet Take 1 tablet (10 mg total) by mouth daily. 90 tablet 2   ASPIRIN ADULT LOW STRENGTH 81 MG tablet Take 1 tablet (81 mg total) by mouth daily. Restart in 1 week 30 tablet 12   b complex-vitamin c-folic acid (NEPHRO-VITE) 0.8 MG TABS tablet Take 1 tablet by mouth at bedtime.     Baclofen 5 MG TABS Take 1 tablet by mouth 3 (three) times daily.     buPROPion ER (WELLBUTRIN SR) 100 MG 12 hr tablet Take 100 mg by mouth 2 (two) times daily.     cinacalcet (SENSIPAR) 30 MG tablet Take 30 mg by mouth daily with breakfast.     cloNIDine (CATAPRES) 0.2 MG tablet Take 1 tablet (0.2 mg total) by mouth 3 (three) times daily. TAKE 1 TABLET(0.2 MG) BY MOUTH THREE TIMES DAILY 270 tablet 2   cyanocobalamin (VITAMIN B12) 1000 MCG tablet Take 1 tablet (1,000 mcg total) by mouth daily.     gabapentin (NEURONTIN) 300 MG  capsule Take 2 capsules (600 mg total) by mouth 2 (two) times daily. May also take 2 capsules (600 mg total) daily as needed. (Patient taking differently: 300 MG Twice a day) 540 capsule 3   glucose blood (ONETOUCH VERIO) test strip Use as directed to check Blood Glucose twice daily 150 strip 5   insulin glargine (LANTUS SOLOSTAR) 100 UNIT/ML Solostar Pen Inject 30 Units into the skin at bedtime. (Patient taking differently: Inject 15 Units into the skin at bedtime.) 15 mL 2   iron polysaccharides (NIFEREX) 150 MG capsule TAKE 1 CAPSULE(150 MG) BY MOUTH DAILY (Patient taking differently: Take 150 mg by mouth daily.) 90 capsule 2   Lancets (ONETOUCH DELICA PLUS LANCET33G) MISC USE TO TEST BLOOD SUGAR FOUR TIMES DAILY 200 each 2   lisinopril (ZESTRIL) 20 MG tablet TAKE 1 TABLET(20 MG) BY MOUTH DAILY (Patient taking differently: Take 20 mg by mouth daily.) 90 tablet 2   metoprolol tartrate (LOPRESSOR) 50 MG tablet Take 50 mg by mouth daily.     oxyCODONE-acetaminophen (PERCOCET) 7.5-325 MG tablet Take 1 tablet by mouth every 4 (four) hours as needed. 180 tablet 0   pantoprazole (PROTONIX) 40 MG tablet TAKE 1 TABLET(40 MG) BY MOUTH DAILY (Patient taking differently: Take 40 mg by mouth daily. TAKE 1 TABLET(40 MG) BY MOUTH DAILY) 90 tablet 2   polyethylene glycol powder (MIRALAX) 17 GM/SCOOP powder Take one capful daily. Hold for diarrhea. 527 g 5   Prucalopride Succinate (MOTEGRITY) 2 MG TABS Take 1 tablet (2 mg total) by mouth daily. 30 tablet 11   simvastatin (ZOCOR) 20 MG tablet TAKE 1 TABLET(20 MG) BY MOUTH AT BEDTIME (Patient taking differently: Take 20 mg by mouth daily at 6 PM.) 90 tablet 2   tamsulosin (FLOMAX) 0.4 MG CAPS capsule TAKE 1 CAPSULE(0.4 MG) BY MOUTH DAILY (Patient taking differently: Take 0.4 mg by mouth daily.) 90 capsule 2   traZODone (DESYREL) 100 MG tablet Take 100 mg by mouth at bedtime.      Allergies as of  10/17/2023   (No Known Allergies)    Family History  Problem  Relation Age of Onset   Heart failure Mother    Heart failure Father    Heart failure Sister    Heart failure Son    Colon cancer Neg Hx     Social History   Socioeconomic History   Marital status: Married    Spouse name: Not on file   Number of children: 1   Years of education: Not on file   Highest education level: Not on file  Occupational History   Occupation: disabled  Tobacco Use   Smoking status: Former    Current packs/day: 0.00    Average packs/day: 3.0 packs/day for 25.0 years (75.0 ttl pk-yrs)    Types: Cigarettes    Start date: 03/01/1972    Quit date: 03/01/1997    Years since quitting: 26.6   Smokeless tobacco: Never  Vaping Use   Vaping status: Never Used  Substance and Sexual Activity   Alcohol use: No   Drug use: No   Sexual activity: Not Currently  Other Topics Concern   Not on file  Social History Narrative   Not on file   Social Determinants of Health   Financial Resource Strain: Low Risk  (05/29/2020)   Overall Financial Resource Strain (CARDIA)    Difficulty of Paying Living Expenses: Not very hard  Food Insecurity: Not on file  Transportation Needs: Not on file  Physical Activity: Not on file  Stress: Not on file  Social Connections: Not on file  Intimate Partner Violence: Not At Risk (08/31/2018)   Humiliation, Afraid, Rape, and Kick questionnaire    Fear of Current or Ex-Partner: No    Emotionally Abused: No    Physically Abused: No    Sexually Abused: No    Review of Systems: Gen: Denies fever, chills, cold or flulike symptoms. CV: Denies chest pain, heart palpitations. Resp: Denies shortness of breath. GI: See HPI Heme: See HPI  Physical Exam: Vital signs in last 24 hours: Temp:  [98.6 F (37 C)] 98.6 F (37 C) (11/05 0641) Pulse Rate:  [138-142] 138 (11/05 0743) Resp:  [18-23] 21 (11/05 0743) BP: (121-145)/(95-106) 121/97 (11/05 0743) SpO2:  [97 %] 97 % (11/05 0743)   General:   Frail, chronically ill-appearing, right  sided hemiparesis Head:  Normocephalic and atraumatic. Eyes:  Sclera clear, no icterus.   Conjunctiva pink. Ears:  Normal auditory acuity. Lungs:  Clear throughout to auscultation anteriorly.  No wheezes, crackles, or rhonchi. No acute distress. Heart:  Tachycardic but regular rhythm. No murmurs.  Abdomen:  Soft and nondistended. Generalized tenderness to palpation.  Hypoactive bowel sounds. Without guarding, and without rebound.   Rectal:  Deferred   Skin:  Intact without significant lesions or rashes. Psych:  Normal mood and affect.  Intake/Output from previous day: No intake/output data recorded. Intake/Output this shift: No intake/output data recorded.  Lab Results: Recent Labs    10/17/23 0731  WBC 26.7*  HGB 9.3*  HCT 31.7*  PLT 439*   BMET Recent Labs    10/17/23 0731  NA 140  K 4.6  CL 99  CO2 29  GLUCOSE 295*  BUN 50*  CREATININE 2.17*  CALCIUM 9.5   LFT Recent Labs    10/17/23 0731  PROT 7.5  ALBUMIN 2.7*  AST 15  ALT 16  ALKPHOS 100  BILITOT 0.3    Impression: 77 y.o. year old male with history of stroke with residual right-sided hemiparesis,  hepatic steatosis, renal insufficiency, non-small cell lung cancer, lymphoma, MGUS, adenocarcinoma of the parotid gland, asthma, HTN, DM, CKD, Barrett's esophagus, EOE treated with steroids,  GERD, gastroparesis, hemorrhoids, colon polyps, chronic constipation, hemorrhagic colitis in 2013 presumed infectious, admitted in August 2023 with hematemesis found to have hemorrhagic gastric polyps and duodenal adenoma with low-grade dysplasia with recommendations to follow conservatively considering overall poor health, chronic right upper quadrant pain suspected to be neurogenic or MSK, chronic IDA with no recommendations to repeat endoscopic evaluation due to multiple comorbidities increasing risk of morbidity and mortality, now presenting to the emergency room per EMS from [facility] for nausea, vomiting,  coffee-ground emesis starting early this morning. Patient also endorsing abdominal pain.   Upon presenting to the ER, patient was tachycardic with heart rate into the 140s, BP initially normal, but has become hypertensive.  Hemoglobin down to 9.3, previously 10.1 about 5 months ago.  Also white blood cell count elevated at 26.7.  LFTs within normal limits.  BUN is elevated at 50, but this is chronic and close to baseline in the setting of CKD, creatinine 2.17.  No recurrent hematemesis in the last couple of hours.  Nausea improved by Zofran. Continues with abdominal pain.  Etiology of nausea/vomiting/hematemesis is unclear. Differential is broad including recurrent hemorrhagic gastric polyp, bleeding duodenal adenoma versus progression to malignancy, gastric outlet obstruction, gastritis, duodenitis, PUD, Mallory-Weiss tear.  Considering hypoactive bowel sounds and inability to find last documented bowel movement at nursing facility, unable to rule out bowel obstruction.  We will obtain CT abdomen pelvis to further evaluate his abdominal pain.  If no acute findings, would need to consider EGD once tachycardia and hypertension have improved.   Considering patient's overall frailty, miltimorbidity, and considering oncology has recommended no further management/work-up, would be a good idea to get a palliative consult to outline goals of care.  Plan: Keep NPO CT A/P without contrast stat. IV Protonix 40 mg twice daily Zofran PRN. Would recommend scheduling Zofran every 6 hours if recurrent emesis.  Monitor for recurrent hematemesis/coffee-ground emesis.   Monitor H&H closely.  Transfuse for hemoglobin less than 7. Management of tachycardia and hypertension per hospitalist. Consider EGD once hemodynamically stable. Would benefit from palliative consult.     LOS: 0 days    10/17/2023, 8:23 AM   Ermalinda Memos, PA-C [facility]

## 2023-10-17 NOTE — ED Triage Notes (Signed)
Pt bib [facility] from [facility]. Per facility pt began having N/V yesterday. Appears to be coffee ground emesis in triage. Pt c/o abdominal pain. Was given phenergan at 0130 with minimal relief.  Pt has ride sided weakness from previous stroke.

## 2023-10-17 NOTE — H&P (Signed)
Patient Demographics:    [name], is a 77 y.o. male  MRN: [id number]   DOB - 1946-08-30  Admit Date - 10/17/2023  Outpatient Primary MD for the patient is [name], DO   Assessment & Plan:   Assessment and Plan:  1)CAP--??  Aspiration related -CT study shows right-sided pneumonia most likely aspiration related in the setting of recurrent emesis WBC 26.7 -No hypoxia -Treat empirically with Rocephin/azithromycin, mucolytics and bronchodilators as ordered  2)Severe constipation with fecal impaction -- CT abdomen and pelvis with fecal impaction severe constipation -GI consult appreciated -Laxatives as ordered  3) hematemesis and concern for GI bleed--- -Emesis is coffee-ground-takes iron pills Hgb 10.1 >>9.3 >> 10.2 -GI consult appreciated -IV Protonix every 12 hours as ordered  4)history of stroke with residual right-sided hemiparesis--- aspirin and Crestor on hold  5)AKI----on CKD 3A -Creatinine---2.17  (baseline 1.47) --Hold lisinopril -Hydrate IV while NPO , renally adjust medications, avoid nephrotoxic agents / dehydration  / hypotension  6)Social/ethics--- plan of care and advanced directive discussed with patient and wife at bedside MOST Form reviewed -Patient is a DNR/DNI  7)HTN---Continue amlodipine, clonidine metoprolol -Hold lisinopril due to AKI  8)DM2--no recent A1c -Continue Lantus insulin Use Novolog/Humalog Sliding scale insulin with Accu-Cheks/Fingersticks as ordered   Dispo: The patient is from: SNF              Anticipated d/c is to: SNF              Anticipated d/c date is: 2 days              Patient currently is not medically stable to d/c. 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 Jena Gauss   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) 07/15/2011   CVA (cerebral infarction) 1998   right sided deficit   Delayed gastric emptying 2018   Diverticulosis    TCS 03/23/11 pancolonic diverticula &TCS 5/08, pancolonic diverticula   DM (diabetes mellitus) (HCC)    Edema of lower extremity 12/21/12   bilateral    Esotropia of left eye    GERD (gastroesophageal reflux disease)    Glaucoma (increased eye pressure)    Gout    Gout    Hemorrhagic colitis 06/06/2012.   Hemorrhoids, internal 03/23/2011   tcs by Dr. Jena Gauss   Hepatitis    esosiniphilic, tx with prednisone   Hiatal hernia    History of radiation therapy 05/21/18- 05/30/18   Left Lung/ 54 Gy delivered in 3  fractions of 18 Gy. SBRT   HTN (hypertension)    Hx of radiation therapy 1974   right base of skull area-lymphoma   Hyperlipidemia    Lower facial weakness    Right   Lymphoma (HCC) 1974   XRT at [facility], right base of skull area   Neuropathy    Non-small cell lung cancer (HCC) dx'd 04/26/18   Peripheral edema    R>L legs   Rash    chronic, recurrent, R>L legs   Renal insufficiency    Steatohepatitis    liver biopsy 2009   Stroke [facility]) 1998   right hemiparesis/plegia      Past Surgical History:  Procedure Laterality Date   BIOPSY  12/01/2016   Procedure: BIOPSY;  Surgeon: Corbin Ade, MD;  Location: [facility];  Service: Endoscopy;;  duodenum, gastric, esophagus   BIOPSY  07/15/2022   Procedure: BIOPSY;  Surgeon: Corbin Ade, MD;  Location: [facility];  Service: Endoscopy;;  duodenal, mass;   CHOLECYSTECTOMY     COLONOSCOPY  03/23/11   Dr. Jena Gauss  pancolonic diverticula, hemorrhoids, tubular adenoma..  next tcs 03/2016   COLONOSCOPY WITH PROPOFOL N/A 12/01/2016   inadequate bowel prep precluded exam   ESOPHAGOGASTRODUODENOSCOPY  02/05/08   goblet cell metaplasia/negative for H.pylori   ESOPHAGOGASTRODUODENOSCOPY  03/23/11   Dr. Jena Gauss, barretts, hiatal hernia   ESOPHAGOGASTRODUODENOSCOPY (EGD) WITH PROPOFOL N/A 12/01/2016   Dr. Jena Gauss: Large amount of retained gastric contents precluded completion of the stomach. Mucosal changes were found in the stomach. Erosions and somewhat scalloped appearing mucosa present, reactive gastritis/no H pylori. Barrett's esophagus noted, no dysplasia on biopsy. Duodenal biopsies taken as well, benign, no evidence of eosinophilia.   ESOPHAGOGASTRODUODENOSCOPY (EGD) WITH PROPOFOL N/A 07/15/2022   Procedure: ESOPHAGOGASTRODUODENOSCOPY (EGD) WITH PROPOFOL;  Surgeon: Corbin Ade, MD;  [facility];  Service: Endoscopy;  Laterality: N/A;   IR FLUORO GUIDED NEEDLE PLC ASPIRATION/INJECTION LOC  12/13/2018   IR KYPHO LUMBAR INC FX REDUCE BONE BX UNI/BIL CANNULATION INC/IMAGING  12/13/2018   IR RADIOLOGY PERIPHERAL GUIDED IV START  12/13/2018   IR US GUIDE VASC ACCESS LEFT  12/13/2018   MASS BIOPSY  11/01/2012   Procedure: NECK MASS BIOPSY;  Surgeon: Darletta Moll, MD;  Location: [facility];  Service: ENT;  Laterality: Right;  Excisional Bx Right Neck Mass; attempted external jugular cutdown of left side   PAROTIDECTOMY  11/24/2012   Procedure: PAROTIDECTOMY;  Surgeon: Darletta Moll, MD;  Location: [facility] OR;  Service: ENT;  Laterality: N/A;  Total parotidectomy   PLEURECTOMY     right lymph node removal Right    behind right ear   Right video-assisted thoracic surgery, pleurectomy, and pleurodesis  2011   VIDEO BRONCHOSCOPY WITH ENDOBRONCHIAL NAVIGATION N/A 04/26/2018   Procedure: VIDEO BRONCHOSCOPY WITH ENDOBRONCHIAL NAVIGATION;  Surgeon: Loreli Slot, MD;  Location: [facility] OR;  Service: Thoracic;  Laterality: N/A;    Chief Complaint  Patient presents with   Hematemesis       HPI:    [name]  is a 77 y.o. male with history of stroke with residual right-sided hemiparesis, hepatic steatosis, renal insufficiency, non-small cell lung cancer, lymphoma, MGUS, adenocarcinoma of the parotid gland, asthma, HTN, DM, Barrett's esophagus, EOE treated with steroids,  GERD, gastroparesis, hemorrhoids, colon polyps, chronic constipation, h/o hemorrhagic colitis presenting from [facility] with nausea vomiting and concern for coffee-ground emesis - Additional history obtained from patient's wife at bedside -Patient had abdominal pain -  Emesis is coffee-ground-takes iron pills -CT abdomen and pelvis with possible right-sided pneumonia and fecal impaction/constipation WBC 26.7,  Hgb 10.1 >>9.3 >> 10.2 Creatinine---2.17  (baseline 1.47)   Review of systems:    In addition to the HPI above,   A full Review of  Systems was done, all other systems reviewed are negative except as noted above in HPI , .    Social History:  Reviewed by me   Social History   Tobacco Use   Smoking status: Former    Current packs/day: 0.00    Average packs/day: 3.0 packs/day for 25.0 years (75.0 ttl pk-yrs)    Types: Cigarettes    Start date: 03/01/1972    Quit date: 03/01/1997    Years since quitting: 26.6   Smokeless tobacco: Never  Substance Use Topics   Alcohol use: No     Family History :  Reviewed by me    Family History  Problem Relation Age of Onset   Heart failure Mother    Heart failure Father    Heart failure Sister    Heart failure Son    Colon cancer Neg Hx      Home Medications:   Prior to Admission medications   Medication Sig Start Date End Date Taking? Authorizing Provider  acetaminophen (TYLENOL) 650 MG CR tablet Take 650 mg by mouth every 4 (four) hours as needed for pain.   Yes [provider]  allopurinol (ZYLOPRIM) 300 MG tablet TAKE 1 TABLET(300 MG) BY MOUTH DAILY Patient taking differently: Take 300 mg by mouth daily. TAKE 1  TABLET(300 MG) BY MOUTH DAILY 12/10/21  Yes Donita Brooks, MD  amLODipine (NORVASC) 10 MG tablet Take 1 tablet (10 mg total) by mouth daily. 12/10/21  Yes Donita Brooks, MD  ASPIRIN ADULT LOW STRENGTH 81 MG tablet Take 1 tablet (81 mg total) by mouth daily. Restart in 1 week 07/17/22  Yes Erick Blinks, MD  b complex-vitamin c-folic acid (NEPHRO-VITE) 0.8 MG TABS tablet Take 1 tablet by mouth at bedtime.   Yes [provider]  Baclofen 5 MG TABS Take 1 tablet by mouth 3 (three) times daily. 08/22/22  Yes [provider]  buPROPion ER (WELLBUTRIN SR) 100 MG 12 hr tablet Take 100 mg by mouth 2 (two) times daily. 02/03/22  Yes [provider]  cinacalcet (SENSIPAR) 30 MG tablet Take 30 mg by mouth daily with breakfast. 02/03/22  Yes [provider]  cloNIDine (CATAPRES) 0.2 MG tablet Take 1 tablet (0.2 mg total) by mouth 3 (three) times daily. TAKE 1 TABLET(0.2 MG) BY MOUTH THREE TIMES DAILY 12/10/21  Yes Donita Brooks, MD  cyanocobalamin (VITAMIN B12) 1000 MCG tablet Take 1 tablet (1,000 mcg total) by mouth daily. 07/17/22  Yes Erick Blinks, MD  ferrous sulfate 324 (65 Fe) MG TBEC Take 1 tablet by mouth daily at 12 noon.   Yes [provider]  furosemide (LASIX) 20 MG tablet Take 20 mg by mouth daily.   Yes [provider]  gabapentin (NEURONTIN) 300 MG capsule Take 2 capsules (600 mg total) by mouth 2 (two) times daily. May also take 2 capsules (600 mg total) daily as needed. Patient taking differently: 300 MG Twice a day 12/10/21  Yes Donita Brooks, MD  insulin glargine (LANTUS SOLOSTAR) 100 UNIT/ML Solostar Pen Inject 30 Units into the skin at bedtime. Patient taking differently: Inject 15 Units into the skin at bedtime. 03/04/21  Yes Dani Gobble, NP  insulin lispro (  HUMALOG KWIKPEN) 100 UNIT/ML KwikPen Inject into the skin 4 (four) times daily -  before meals and at bedtime. Per sliding scale   Yes [provider]   ipratropium-albuterol (DUONEB) 0.5-2.5 (3) MG/3ML SOLN Take 3 mLs by nebulization every 6 (six) hours.   Yes [provider]  iron polysaccharides (NIFEREX) 150 MG capsule TAKE 1 CAPSULE(150 MG) BY MOUTH DAILY Patient taking differently: Take 150 mg by mouth daily. 07/07/21  Yes Donita Brooks, MD  lisinopril (ZESTRIL) 20 MG tablet TAKE 1 TABLET(20 MG) BY MOUTH DAILY Patient taking differently: Take 20 mg by mouth daily. 06/18/21  Yes Donita Brooks, MD  metoprolol tartrate (LOPRESSOR) 50 MG tablet Take 50 mg by mouth daily. 12/10/21  Yes [provider]  mirtazapine (REMERON) 15 MG tablet Take 15 mg by mouth at bedtime.   Yes [provider]  ondansetron (ZOFRAN-ODT) 4 MG disintegrating tablet Take 4 mg by mouth every 6 (six) hours as needed for nausea or vomiting.   Yes [provider]  oxyCODONE-acetaminophen (PERCOCET) 7.5-325 MG tablet Take 1 tablet by mouth every 4 (four) hours as needed. 08/10/21  Yes Donita Brooks, MD  pantoprazole (PROTONIX) 40 MG tablet TAKE 1 TABLET(40 MG) BY MOUTH DAILY Patient taking differently: Take 40 mg by mouth daily. TAKE 1 TABLET(40 MG) BY MOUTH DAILY 05/21/21  Yes Donita Brooks, MD  polyethylene glycol powder (MIRALAX) 17 GM/SCOOP powder Take one capful daily. Hold for diarrhea. 08/24/22  Yes Tiffany Kocher, PA-C  Prucalopride Succinate (MOTEGRITY) 2 MG TABS Take 1 tablet (2 mg total) by mouth daily. 11/22/22  Yes Rourk, Gerrit Friends, MD  rosuvastatin (CRESTOR) 20 MG tablet Take 20 mg by mouth daily.   Yes [provider]  tamsulosin (FLOMAX) 0.4 MG CAPS capsule TAKE 1 CAPSULE(0.4 MG) BY MOUTH DAILY Patient taking differently: Take 0.4 mg by mouth daily. 12/10/21  Yes Donita Brooks, MD  traZODone (DESYREL) 100 MG tablet Take 100 mg by mouth at bedtime. 03/07/22  Yes [provider]  simvastatin (ZOCOR) 20 MG tablet TAKE 1 TABLET(20 MG) BY MOUTH AT BEDTIME Patient taking differently: Take 20 mg by  mouth daily at 6 PM. 05/21/21   Donita Brooks, MD     Allergies:    No Known Allergies   Physical Exam:   Vitals  Blood pressure (!) 116/99, pulse (!) 107, temperature 98 F (36.7 C), temperature source Oral, resp. rate 18, height 6\' 1"  (1.854 m), SpO2 96%.  Physical Examination: General appearance - alert,  in no distress  Mental status - alert, oriented to person, place, and time,  Eyes - sclera anicteric Neck - supple, no JVD elevation , Chest - clear  to auscultation bilaterally, symmetrical air movement,  Heart - S1 and S2 normal, regular  Abdomen - soft, nontender, nondistended, +BS Neurological -chronic right-sided hemiplegia  extremities -  intact peripheral pulses , right leg lymphedema from stroke related issues Skin - warm, dry   Data Review:    CBC Recent Labs  Lab 10/17/23 0731 10/17/23 1317  WBC 26.7*  --   HGB 9.3* 10.2*  HCT 31.7* 35.3*  PLT 439*  --   MCV 71.2*  --   MCH 20.9*  --   MCHC 29.3*  --   RDW 21.4*  --    ------------------------------------------------------------------------------------------------------------------  Chemistries  Recent Labs  Lab 10/17/23 0731  NA 140  K 4.6  CL 99  CO2 29  GLUCOSE 295*  BUN 50*  CREATININE 2.17*  CALCIUM 9.5  AST 15  ALT 16  ALKPHOS 100  BILITOT 0.3   ---------------------------------------------------------------------------------    Component Value Date/Time   BNP 5 09/14/2021 1318   Urinalysis    Component Value Date/Time   COLORURINE YELLOW 07/14/2022 1215   APPEARANCEUR HAZY (A) 07/14/2022 1215   LABSPEC 1.016 07/14/2022 1215   PHURINE 7.0 07/14/2022 1215   GLUCOSEU NEGATIVE 07/14/2022 1215   HGBUR NEGATIVE 07/14/2022 1215   BILIRUBINUR NEGATIVE 07/14/2022 1215   KETONESUR NEGATIVE 07/14/2022 1215   PROTEINUR 100 (A) 07/14/2022 1215   UROBILINOGEN 0.2 06/20/2014 0135   NITRITE NEGATIVE 07/14/2022 1215   LEUKOCYTESUR TRACE (A) 07/14/2022 1215    Imaging Results:     CT ABDOMEN PELVIS WO CONTRAST  Result Date: 10/17/2023 CLINICAL DATA:  Generalized abdominal pain, nausea and vomiting, coffee ground emesis EXAM: CT ABDOMEN AND PELVIS WITHOUT CONTRAST TECHNIQUE: Multidetector CT imaging of the abdomen and pelvis was performed following the standard protocol without IV contrast. Unenhanced CT was performed per clinician order. Lack of IV contrast limits sensitivity and specificity, especially for evaluation of abdominal/pelvic solid viscera. RADIATION DOSE REDUCTION: This exam was performed according to the departmental dose-optimization program which includes automated exposure control, adjustment of the mA and/or kV according to patient size and/or use of iterative reconstruction technique. COMPARISON:  07/14/2022 FINDINGS: Lower chest: There is patchy airspace disease within the dependent right lower lobe, which could reflect infection, inflammation, or aspiration. Hepatobiliary: Prior cholecystectomy. Unremarkable unenhanced appearance of the liver. No biliary duct dilation. Pancreas: Unremarkable unenhanced appearance. Spleen: Unremarkable unenhanced appearance. Adrenals/Urinary Tract: There are multiple bilateral renal hypodensities, stable since prior exam, previously characterized as benign cystic lesions that do not require specific imaging follow-up. No urinary tract calculi or obstructive uropathy within either kidney. Bladder is minimally distended, limiting its evaluation. No filling defects. The adrenals are stable. Stomach/Bowel: No bowel obstruction or ileus. There is a large amount of retained stool seen within the rectal vault which may reflect fecal impaction. Diverticulosis of the descending and sigmoid colon. Normal appendix right lower quadrant. No bowel wall thickening or inflammatory change. Vascular/Lymphatic: Aortic atherosclerosis. No enlarged abdominal or pelvic lymph nodes. Reproductive: Prostate is unremarkable. Other: No free fluid or free  intraperitoneal gas. No abdominal wall hernia. Musculoskeletal: Chronic L1 compression deformity and prior vertebral augmentation. No acute displaced fractures. Reconstructed images demonstrate no additional findings. IMPRESSION: 1. Patchy right lower lobe airspace disease which may reflect inflammation, infection, or aspiration. 2. Significant retained stool within the rectum, compatible fecal impaction. No bowel obstruction or ileus. 3. Distal colonic diverticulosis without diverticulitis. 4.  Aortic Atherosclerosis (ICD10-I70.0). Electronically Signed   By: Sharlet Salina M.D.   On: 10/17/2023 14:41    Radiological Exams on Admission: CT ABDOMEN PELVIS WO CONTRAST  Result Date: 10/17/2023 CLINICAL DATA:  Generalized abdominal pain, nausea and vomiting, coffee ground emesis EXAM: CT ABDOMEN AND PELVIS WITHOUT CONTRAST TECHNIQUE: Multidetector CT imaging of the abdomen and pelvis was performed following the standard protocol without IV contrast. Unenhanced CT was performed per clinician order. Lack of IV contrast limits sensitivity and specificity, especially for evaluation of abdominal/pelvic solid viscera. RADIATION DOSE REDUCTION: This exam was performed according to the departmental dose-optimization program which includes automated exposure control, adjustment of the mA and/or kV according to patient size and/or use of iterative reconstruction technique. COMPARISON:  07/14/2022 FINDINGS: Lower chest: There is patchy airspace disease within the dependent right lower lobe, which could reflect infection, inflammation, or aspiration. Hepatobiliary: Prior  cholecystectomy. Unremarkable unenhanced appearance of the liver. No biliary duct dilation. Pancreas: Unremarkable unenhanced appearance. Spleen: Unremarkable unenhanced appearance. Adrenals/Urinary Tract: There are multiple bilateral renal hypodensities, stable since prior exam, previously characterized as benign cystic lesions that do not require specific  imaging follow-up. No urinary tract calculi or obstructive uropathy within either kidney. Bladder is minimally distended, limiting its evaluation. No filling defects. The adrenals are stable. Stomach/Bowel: No bowel obstruction or ileus. There is a large amount of retained stool seen within the rectal vault which may reflect fecal impaction. Diverticulosis of the descending and sigmoid colon. Normal appendix right lower quadrant. No bowel wall thickening or inflammatory change. Vascular/Lymphatic: Aortic atherosclerosis. No enlarged abdominal or pelvic lymph nodes. Reproductive: Prostate is unremarkable. Other: No free fluid or free intraperitoneal gas. No abdominal wall hernia. Musculoskeletal: Chronic L1 compression deformity and prior vertebral augmentation. No acute displaced fractures. Reconstructed images demonstrate no additional findings. IMPRESSION: 1. Patchy right lower lobe airspace disease which may reflect inflammation, infection, or aspiration. 2. Significant retained stool within the rectum, compatible fecal impaction. No bowel obstruction or ileus. 3. Distal colonic diverticulosis without diverticulitis. 4.  Aortic Atherosclerosis (ICD10-I70.0). Electronically Signed   By: Sharlet Salina M.D.   On: 10/17/2023 14:41    DVT Prophylaxis -SCD/Gi Bleed AM Labs Ordered, also please review Full Orders  Family Communication: Admission, patients condition and plan of care including tests being ordered have been discussed with the patient and wife  who indicate understanding and agree with the plan   Condition  -stable  Shon Hale M.D on 10/17/2023 at 6:30 PM Go to [email or web address] -  for contact info  [facility] - Office  [phone]

## 2023-10-17 NOTE — Inpatient Diabetes Management (Signed)
Inpatient Diabetes Program Recommendations  AACE/ADA: New Consensus Statement on Inpatient Glycemic Control (2015)  Target Ranges:  Prepandial:   less than 140 mg/dL      Peak postprandial:   less than 180 mg/dL (1-2 hours)      Critically ill patients:  140 - 180 mg/dL   Lab Results  Component Value Date   GLUCAP 231 (H) 10/17/2023   HGBA1C 5.0 07/15/2022    Review of Glycemic Control  Latest Reference Range & Units 10/17/23 11:15 10/17/23 12:00  Glucose-Capillary 70 - 99 mg/dL 737 (H) 106 (H)  (H): Data is abnormally high  Diabetes history: DM2 Outpatient Diabetes medications: Lantus 15 units QD Current orders for Inpatient glycemic control: Semglee 8 units QHS  Inpatient Diabetes Program Recommendations:    Please consider:  Glycemic control order set using Novolog 0-9 units TID and 0-5 units at bedtime.  Will continue to follow while inpatient.  Thank you, Dulce Sellar, MSN, CDCES Diabetes Coordinator Inpatient Diabetes Program [phone] (team pager from 8a-5p)

## 2023-10-17 NOTE — Progress Notes (Signed)
Received pt from ED with high HR. Current HR is 138. Pt received PO Metoprolol before he was sent to the floor. MD was informed including GI, charge is aware. MD ordered Telemetry, on Sinus Tachy. Pt remains asymptomatic, Yellow MEWS protocol is implemented.   10/17/23 1044  Assess: MEWS Score  Temp 98.2 F (36.8 C)  BP (!) 144/108  MAP (mmHg) 120  Pulse Rate (!) 138  Resp 18  SpO2 98 %  O2 Device Room Air  Assess: MEWS Score  MEWS Temp 0  MEWS Systolic 0  MEWS Pulse 3  MEWS RR 0  MEWS LOC 0  MEWS Score 3  MEWS Score Color Yellow  Assess: if the MEWS score is Yellow or Red  Were vital signs accurate and taken at a resting state? Yes  Does the patient meet 2 or more of the SIRS criteria? Yes  Does the patient have a confirmed or suspected source of infection? No  MEWS guidelines implemented  Yes, yellow  Treat  MEWS Interventions Considered administering scheduled or prn medications/treatments as ordered  Take Vital Signs  Increase Vital Sign Frequency  Yellow: Q2hr x1, continue Q4hrs until patient remains green for 12hrs  Escalate  MEWS: Escalate Yellow: Discuss with charge nurse and consider notifying provider and/or RRT  Notify: Charge Nurse/RN  Name of Charge Nurse/RN Notified [facility] CN  Provider Notification  Provider Name/Title Emokpae  Date Provider Notified 10/17/23  Time Provider Notified 1100  Method of Notification Page  Notification Reason Other (Comment) (Yellow MEWS. High HR)  Provider response See new orders (Tele order)  Date of Provider Response 10/17/23  Time of Provider Response 1135  Assess: SIRS CRITERIA  SIRS Temperature  0  SIRS Pulse 1  SIRS Respirations  0  SIRS WBC 1  SIRS Score Sum  2

## 2023-10-17 NOTE — Progress Notes (Signed)
HR is lower at this time, HR 111. But BM remains high at 148/104. MD was notified with no new orders. Pharmacy was called to start the Amlodipine today rather that tomorrow, med was moved by Pharmacy today and was given by this RN.  10/17/23 1333  Assess: MEWS Score  Temp 98.4 F (36.9 C)  BP (!) 148/104  MAP (mmHg) 116  Pulse Rate (!) 115  Resp 16  SpO2 95 %  O2 Device Room Air  Assess: MEWS Score  MEWS Temp 0  MEWS Systolic 0  MEWS Pulse 2  MEWS RR 0  MEWS LOC 0  MEWS Score 2  MEWS Score Color Yellow  Assess: SIRS CRITERIA  SIRS Temperature  0  SIRS Pulse 1  SIRS Respirations  0  SIRS WBC 1  SIRS Score Sum  2

## 2023-10-17 NOTE — ED Provider Notes (Signed)
[place] [facility] DE[facility]MENT AT [facility] Provider Note  CSN: [id number] Arrival date & time: 10/17/23 6578  Chief Complaint(s) Hematemesis  HPI [name] is a 77 y.o. male with PMH Barrett's esophagus, BPH, parotid adenocarcinoma, previous CVA with right-sided hemiparesis and dysarthria, T2DM, GERD, HLD, diverticulosis, hospital admission in 2023 for upper GI bleed secondary to hemorrhagic gastric polyp who presents Emergency Department for evaluation of hematemesis.  Symptoms began this morning with multiple episodes of coffee-ground emesis.  Patient arrives with dried blood at his lips.  Patient also arrives significant tachycardic with heart rates in the 140s but blood pressures are normal.  Denies associated chest pain, abdominal pain, nausea, headache, fever or other systemic symptoms.  No hematochezia.   Past Medical History Past Medical History:  Diagnosis Date   Arthritis    Asthma    "hx of"   Barrett's esophagus    EGD 03/23/2011 & EGD 2/09 bx proven   BPH (benign prostatic hyperplasia)    Cancer of parotid gland (HCC) 11/23/12   Adenocarcinoma   Chronic abdominal pain    Chronic constipation    Colon polyp 03/23/2011   tubular adenoma, Dr. Jena Gauss   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) 07/15/2011   CVA (cerebral infarction) 1998   right sided deficit   Delayed gastric emptying 2018   Diverticulosis    TCS 03/23/11 pancolonic diverticula &TCS 5/08, pancolonic diverticula   DM (diabetes mellitus) (HCC)    Edema of lower extremity 12/21/12   bilateral    Esotropia of left eye    GERD (gastroesophageal reflux disease)    Glaucoma (increased eye pressure)    Gout    Gout    Hemorrhagic colitis 06/06/2012.   Hemorrhoids, internal 03/23/2011   tcs by Dr. Jena Gauss   Hepatitis    esosiniphilic, tx with prednisone   Hiatal hernia    History of radiation therapy 05/21/18- 05/30/18   Left Lung/ 54 Gy delivered in 3 fractions of 18 Gy. SBRT    HTN (hypertension)    Hx of radiation therapy 1974   right base of skull area-lymphoma   Hyperlipidemia    Lower facial weakness    Right   Lymphoma (HCC) 1974   XRT at [facility], right base of skull area   Neuropathy    Non-small cell lung cancer (HCC) dx'd 04/26/18   Peripheral edema    R>L legs   Rash    chronic, recurrent, R>L legs   Renal insufficiency    Steatohepatitis    liver biopsy 2009   Stroke [facility]) 1998   right hemiparesis/plegia   Patient Active Problem List   Diagnosis Date Noted   Adenomatous duodenal polyp 08/24/2022   Coffee ground emesis 07/14/2022   MGUS (monoclonal gammopathy of unknown significance) 03/03/2021   Chronic obstructive pulmonary disease (HCC) 02/17/2021   Common variable immunodeficiency (HCC) 02/17/2021   Osteoarthrosis 02/17/2021   Diverticular disease 01/05/2021   History of cerebrovascular accident 01/05/2021   Hiatal hernia 01/05/2021   Hyperlipidemia 01/05/2021   Glaucoma 01/05/2021   Esotropia of left eye 01/05/2021   Benign prostatic hyperplasia 01/05/2021   History of radiation therapy 01/05/2021   Anemia 01/05/2021   GI bleed 03/18/2020   Acute lower GI bleeding 03/03/2020   Pressure injury of skin 03/03/2020   Thoracic aortic aneurysm without rupture (HCC) 11/19/2019   Hardening of the aorta (main artery of the heart) (HCC) 11/19/2019   Protein-calorie malnutrition (HCC) 06/10/2019   Avitaminosis  D 03/07/2019   Male hypogonadism 07/26/2018   Gynecomastia 06/27/2018   Malignant neoplasm of upper lobe of left lung (HCC) 04/06/2018   History of malignant neoplasm of parotid gland 02/07/2018   Adenoma of large intestine 11/09/2016   Adult hypothyroidism 09/09/2015   Chronic constipation 05/22/2015   Impetigo bullosa 10/23/2013   Chronic left shoulder pain 09/25/2013   Muscle weakness (generalized) 09/25/2013   Hypercalcemia 08/08/2013   Chronic kidney disease, stage 3 unspecified (HCC) 08/07/2013   AKI (acute kidney injury)  (HCC) 07/24/2013   Acute kidney failure, unspecified (HCC) 07/24/2013   Adhesive capsulitis of left shoulder 05/14/2013   GERD (gastroesophageal reflux disease)    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/23/2012   Epidermoid cyst 10/05/2012   Neck pain 08/26/2012   Chronic abdominal pain 08/26/2012   Edema of lower extremity 08/24/2012   Hemiplegia of dominant side as late effect of cerebrovascular disease (HCC) 06/06/2012   Diarrhea 04/17/2012   Alimentary obesity 04/17/2012   Type 2 diabetes mellitus with diabetic neuropathy, unspecified (HCC) 02/13/2012   Recurrent boils 01/12/2012   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) 07/15/2011   Hemorrhoids, internal 03/23/2011   Hepatitis 03/02/2011   Steatohepatitis 03/02/2011   Abdominal pain, right upper quadrant 12/16/2010   Abnormal levels of other serum enzymes 12/16/2010   Eosinophil count raised 12/23/2009   HEMATOCHEZIA 12/23/2009   Type 2 diabetes mellitus (HCC) 01/05/2009   Gout 01/05/2009   Hypertensive disorder 01/05/2009   BARRETTS ESOPHAGUS 01/05/2009   DIVERTICULOSIS OF COLON 01/05/2009   History of malignant lymphoma 01/05/2009   Home Medication(s) Prior to Admission medications   Medication Sig Start Date End Date Taking? Authorizing Provider  allopurinol (ZYLOPRIM) 300 MG tablet TAKE 1 TABLET(300 MG) BY MOUTH DAILY Patient taking differently: Take 300 mg by mouth daily. TAKE 1 TABLET(300 MG) BY MOUTH DAILY 12/10/21   Donita Brooks, MD  amLODipine (NORVASC) 10 MG tablet Take 1 tablet (10 mg total) by mouth daily. 12/10/21   Donita Brooks, MD  ASPIRIN ADULT LOW STRENGTH 81 MG tablet Take 1 tablet (81 mg total) by mouth daily. Restart in 1 week 07/17/22   Erick Blinks, MD  b complex-vitamin c-folic acid (NEPHRO-VITE) 0.8 MG TABS tablet Take 1 tablet by mouth at bedtime.    [provider]  Baclofen 5 MG TABS Take 1 tablet by mouth 3 (three) times daily. 08/22/22   [provider]   buPROPion ER (WELLBUTRIN SR) 100 MG 12 hr tablet Take 100 mg by mouth 2 (two) times daily. 02/03/22   [provider]  cinacalcet (SENSIPAR) 30 MG tablet Take 30 mg by mouth daily with breakfast. 02/03/22   [provider]  cloNIDine (CATAPRES) 0.2 MG tablet Take 1 tablet (0.2 mg total) by mouth 3 (three) times daily. TAKE 1 TABLET(0.2 MG) BY MOUTH THREE TIMES DAILY 12/10/21   Donita Brooks, MD  cyanocobalamin (VITAMIN B12) 1000 MCG tablet Take 1 tablet (1,000 mcg total) by mouth daily. 07/17/22   Erick Blinks, MD  gabapentin (NEURONTIN) 300 MG capsule Take 2 capsules (600 mg total) by mouth 2 (two) times daily. May also take 2 capsules (600 mg total) daily as needed. Patient taking differently: 300 MG Twice a day 12/10/21   Donita Brooks, MD  glucose blood [facility] VERIO) test strip Use as directed to check Blood Glucose twice daily 11/25/20   Dani Gobble, NP  insulin glargine (LANTUS SOLOSTAR) 100 UNIT/ML Solostar Pen Inject 30  Units into the skin at bedtime. Patient taking differently: Inject 15 Units into the skin at bedtime. 03/04/21   Dani Gobble, NP  iron polysaccharides (NIFEREX) 150 MG capsule TAKE 1 CAPSULE(150 MG) BY MOUTH DAILY Patient taking differently: Take 150 mg by mouth daily. 07/07/21   Donita Brooks, MD  Lancets (ONETOUCH DELICA PLUS LANCET33G) MISC USE TO TEST BLOOD SUGAR FOUR TIMES DAILY 07/28/21   Roma Kayser, MD  lisinopril (ZESTRIL) 20 MG tablet TAKE 1 TABLET(20 MG) BY MOUTH DAILY Patient taking differently: Take 20 mg by mouth daily. 06/18/21   Donita Brooks, MD  metoprolol tartrate (LOPRESSOR) 50 MG tablet Take 50 mg by mouth daily. 12/10/21   [provider]  oxyCODONE-acetaminophen (PERCOCET) 7.5-325 MG tablet Take 1 tablet by mouth every 4 (four) hours as needed. 08/10/21   Donita Brooks, MD  pantoprazole (PROTONIX) 40 MG tablet TAKE 1 TABLET(40 MG) BY MOUTH DAILY Patient taking differently: Take 40 mg  by mouth daily. TAKE 1 TABLET(40 MG) BY MOUTH DAILY 05/21/21   Donita Brooks, MD  polyethylene glycol powder (MIRALAX) 17 GM/SCOOP powder Take one capful daily. Hold for diarrhea. 08/24/22   Tiffany Kocher, PA-C  Prucalopride Succinate (MOTEGRITY) 2 MG TABS Take 1 tablet (2 mg total) by mouth daily. 11/22/22   Rourk, Gerrit Friends, MD  simvastatin (ZOCOR) 20 MG tablet TAKE 1 TABLET(20 MG) BY MOUTH AT BEDTIME Patient taking differently: Take 20 mg by mouth daily at 6 PM. 05/21/21   Donita Brooks, MD  tamsulosin (FLOMAX) 0.4 MG CAPS capsule TAKE 1 CAPSULE(0.4 MG) BY MOUTH DAILY Patient taking differently: Take 0.4 mg by mouth daily. 12/10/21   Donita Brooks, MD  traZODone (DESYREL) 100 MG tablet Take 100 mg by mouth at bedtime. 03/07/22   [provider]                                                                                                                                    Past Surgical History Past Surgical History:  Procedure Laterality Date   BIOPSY  12/01/2016   Procedure: BIOPSY;  Surgeon: Corbin Ade, MD;  Location: [facility];  Service: Endoscopy;;  duodenum, gastric, esophagus   BIOPSY  07/15/2022   Procedure: BIOPSY;  Surgeon: Corbin Ade, MD;  Location: [facility];  Service: Endoscopy;;  duodenal, mass;   CHOLECYSTECTOMY     COLONOSCOPY  03/23/11   Dr. Jena Gauss  pancolonic diverticula, hemorrhoids, tubular adenoma.. next tcs 03/2016   COLONOSCOPY WITH PROPOFOL N/A 12/01/2016   inadequate bowel prep precluded exam   ESOPHAGOGASTRODUODENOSCOPY  02/05/08   goblet cell metaplasia/negative for H.pylori   ESOPHAGOGASTRODUODENOSCOPY  03/23/11   Dr. Jena Gauss, barretts, hiatal hernia   ESOPHAGOGASTRODUODENOSCOPY (EGD) WITH PROPOFOL N/A 12/01/2016   Dr. Jena Gauss: Large amount of retained gastric contents precluded completion of the stomach. Mucosal changes were found in the stomach. Erosions and somewhat  scalloped appearing mucosa present, reactive gastritis/no H pylori.  Barrett's esophagus noted, no dysplasia on biopsy. Duodenal biopsies taken as well, benign, no evidence of eosinophilia.   ESOPHAGOGASTRODUODENOSCOPY (EGD) WITH PROPOFOL N/A 07/15/2022   Procedure: ESOPHAGOGASTRODUODENOSCOPY (EGD) WITH PROPOFOL;  Surgeon: Corbin Ade, MD;  Location: [facility];  Service: Endoscopy;  Laterality: N/A;   IR FLUORO GUIDED NEEDLE PLC ASPIRATION/INJECTION LOC  12/13/2018   IR KYPHO LUMBAR INC FX REDUCE BONE BX UNI/BIL CANNULATION INC/IMAGING  12/13/2018   IR RADIOLOGY PERIPHERAL GUIDED IV START  12/13/2018   IR US GUIDE VASC ACCESS LEFT  12/13/2018   MASS BIOPSY  11/01/2012   Procedure: NECK MASS BIOPSY;  Surgeon: Darletta Moll, MD;  Location: [facility] ORS;  Service: ENT;  Laterality: Right;  Excisional Bx Right Neck Mass; attempted external jugular cutdown of left side   PAROTIDECTOMY  11/24/2012   Procedure: PAROTIDECTOMY;  Surgeon: Darletta Moll, MD;  Location: [facility] OR;  Service: ENT;  Laterality: N/A;  Total parotidectomy   PLEURECTOMY     right lymph node removal Right    behind right ear   Right video-assisted thoracic surgery, pleurectomy, and pleurodesis  2011   VIDEO BRONCHOSCOPY WITH ENDOBRONCHIAL NAVIGATION N/A 04/26/2018   Procedure: VIDEO BRONCHOSCOPY WITH ENDOBRONCHIAL NAVIGATION;  Surgeon: Loreli Slot, MD;  Location: [facility] OR;  Service: Thoracic;  Laterality: N/A;   Family History Family History  Problem Relation Age of Onset   Heart failure Mother    Heart failure Father    Heart failure Sister    Heart failure Son    Colon cancer Neg Hx     Social History Social History   Tobacco Use   Smoking status: Former    Current packs/day: 0.00    Average packs/day: 3.0 packs/day for 25.0 years (75.0 ttl pk-yrs)    Types: Cigarettes    Start date: 03/01/1972    Quit date: 03/01/1997    Years since quitting: 26.6   Smokeless tobacco: Never  Vaping Use   Vaping status: Never Used  Substance Use Topics   Alcohol use: No   Drug use: No    Allergies Patient has no known allergies.  Review of Systems Review of Systems  Gastrointestinal:  Positive for vomiting.    Physical Exam Vital Signs  I have reviewed the triage vital signs BP (!) 128/95   Pulse (!) 142   Temp 98.6 F (37 C) (Oral)   Resp 18   SpO2 97%   Physical Exam Constitutional:      General: He is not in acute distress.    Appearance: Normal appearance.  HENT:     Head: Normocephalic and atraumatic.     Comments: Dried blood at the lips    Nose: No congestion or rhinorrhea.  Eyes:     General:        Right eye: No discharge.        Left eye: No discharge.     Extraocular Movements: Extraocular movements intact.     Pupils: Pupils are equal, round, and reactive to light.  Cardiovascular:     Rate and Rhythm: Normal rate and regular rhythm.     Heart sounds: No murmur heard. Pulmonary:     Effort: No respiratory distress.     Breath sounds: No wheezing or rales.  Abdominal:     General: There is no distension.     Tenderness: There is no abdominal tenderness.  Musculoskeletal:  General: Normal range of motion.     Cervical back: Normal range of motion.  Skin:    General: Skin is warm and dry.  Neurological:     General: No focal deficit present.     Mental Status: He is alert.     ED Results and Treatments Labs (all labs ordered are listed, but only abnormal results are displayed) Labs Reviewed  COMPREHENSIVE METABOLIC PANEL  CBC  POC OCCULT BLOOD, ED  TYPE AND SCREEN                                                                                                                          Radiology No results found.  Pertinent labs & imaging results that were available during my care of the patient were reviewed by me and considered in my medical decision making (see MDM for details).  Medications Ordered in ED Medications  pantoprazole (PROTONIX) injection 40 mg (has no administration in time range)  lactated ringers  bolus 1,000 mL (has no administration in time range)                                                                                                                                     Procedures Ultrasound ED Peripheral IV (Provider)  Date/Time: 10/17/2023 8:19 AM  Performed by: Glendora Score, MD Authorized by: Glendora Score, MD   Procedure details:    Indications: multiple failed IV attempts     Skin Prep: isopropyl alcohol     Location:  Left AC   Angiocath:  20 G   Bedside Ultrasound Guided: Yes     Images: not archived     Patient tolerated procedure without complications: Yes     Dressing applied: Yes     (including critical care time)  Medical Decision Making / ED Course   This patient presents to the ED for concern of hematemesis, this involves an extensive number of treatment options, and is a complaint that carries with it a high risk of complications and morbidity.  The differential diagnosis includes upper GI bleed including PUD, varices, boerhaave's, Mallory Weiss tear, aortoenteric fistula versus lower GI bleed including mass, diverticulosis/diverticulitis, hemorrhoids, anal fissure, mesenteric ischemia, aortoenteric fistula, colitis  MDM: Patient seen emergency room for evaluation of hematemesis.  Physical exam with baseline right-sided deficits and facial droop, no significant tenderness in the abdomen.  Laboratory evaluation with a  leukocytosis to 26.7, hemoglobin 9.3 with an MCV of 71.2 down approximately 1 g over the last 5 months.  New BUN 50, creatinine 2.17 which is near patient's baseline.  Albumin 2.7.  Patient fluid resuscitated with 1 L lactated Ringer's, and heart rate improving from high 130s to low 120s.  Tachycardia may also be secondary to rebound tachycardia as patient has not been able to tolerate his daily Lopressor.  PPI initiated.  I spoke with the gastroenterologist on-call Dr. Levon Hedger who is recommending hospital admission and EGD.  Patient  should remain n.p.o.  Patient require hospital admission for hematemesis.  Patient admitted.   Additional history obtained: -Additional history obtained from wife -External records from outside source obtained and reviewed including: Chart review including previous notes, labs, imaging, consultation notes   Lab Tests: -I ordered, reviewed, and interpreted labs.   The pertinent results include:   Labs Reviewed  COMPREHENSIVE METABOLIC PANEL  CBC  POC OCCULT BLOOD, ED  TYPE AND SCREEN      EKG   EKG Interpretation Date/Time:  Tuesday October 17 2023 06:41:50 EST Ventricular Rate:  140 PR Interval:  134 QRS Duration:  83 QT Interval:  303 QTC Calculation: 463 R Axis:   32  Text Interpretation: Sinus tachycardia Confirmed by Dehlia Kilner ([id number]) on 10/17/2023 8:18:47 AM          Medicines ordered and prescription drug management: Meds ordered this encounter  Medications   pantoprazole (PROTONIX) injection 40 mg   lactated ringers bolus 1,000 mL    -I have reviewed the patients home medicines and have made adjustments as needed  Critical interventions none  Consultations Obtained: I requested consultation with the gastroenterologist on-call Dr. Levon Hedger,  and discussed lab and imaging findings as well as pertinent plan - they recommend: PPI, n.p.o., admit for EGD   Cardiac Monitoring: The patient was maintained on a cardiac monitor.  I personally viewed and interpreted the cardiac monitored which showed an underlying rhythm of: Sinus tachycardia  Social Determinants of Health:  Factors impacting patients care include: Lives in a skilled nursing facility   Reevaluation: After the interventions noted above, I reevaluated the patient and found that they have :improved  Co morbidities that complicate the patient evaluation  Past Medical History:  Diagnosis Date   Arthritis    Asthma    "hx of"   Barrett's esophagus    EGD 03/23/2011 & EGD 2/09 bx proven    BPH (benign prostatic hyperplasia)    Cancer of parotid gland (HCC) 11/23/12   Adenocarcinoma   Chronic abdominal pain    Chronic constipation    Colon polyp 03/23/2011   tubular adenoma, Dr. Jena Gauss   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) 07/15/2011   CVA (cerebral infarction) 1998   right sided deficit   Delayed gastric emptying 2018   Diverticulosis    TCS 03/23/11 pancolonic diverticula &TCS 5/08, pancolonic diverticula   DM (diabetes mellitus) (HCC)    Edema of lower extremity 12/21/12   bilateral    Esotropia of left eye    GERD (gastroesophageal reflux disease)    Glaucoma (increased eye pressure)    Gout    Gout    Hemorrhagic colitis 06/06/2012.   Hemorrhoids, internal 03/23/2011   tcs by Dr. Jena Gauss   Hepatitis    esosiniphilic, tx with prednisone   Hiatal hernia    History of radiation therapy 05/21/18- 05/30/18   Left Lung/ 54 Gy delivered in 3 fractions of  18 Gy. SBRT   HTN (hypertension)    Hx of radiation therapy 1974   right base of skull area-lymphoma   Hyperlipidemia    Lower facial weakness    Right   Lymphoma (HCC) 1974   XRT at [facility], right base of skull area   Neuropathy    Non-small cell lung cancer (HCC) dx'd 04/26/18   Peripheral edema    R>L legs   Rash    chronic, recurrent, R>L legs   Renal insufficiency    Steatohepatitis    liver biopsy 2009   Stroke [facility]) 1998   right hemiparesis/plegia      Dispostion: I considered admission for this patient, and patient require hospital admission for hematemesis and persistent tachycardia     Final Clinical Impression(s) / ED Diagnoses Final diagnoses:  None     @PCDICTATION @    Glendora Score, MD 10/17/23 [phone]

## 2023-10-18 DIAGNOSIS — J449 Chronic obstructive pulmonary disease, unspecified: Secondary | ICD-10-CM | POA: Diagnosis not present

## 2023-10-18 DIAGNOSIS — K2951 Unspecified chronic gastritis with bleeding: Secondary | ICD-10-CM | POA: Diagnosis not present

## 2023-10-18 DIAGNOSIS — N179 Acute kidney failure, unspecified: Secondary | ICD-10-CM | POA: Diagnosis present

## 2023-10-18 DIAGNOSIS — L89312 Pressure ulcer of right buttock, stage 2: Secondary | ICD-10-CM | POA: Diagnosis present

## 2023-10-18 DIAGNOSIS — J45909 Unspecified asthma, uncomplicated: Secondary | ICD-10-CM | POA: Diagnosis present

## 2023-10-18 DIAGNOSIS — K920 Hematemesis: Secondary | ICD-10-CM | POA: Diagnosis present

## 2023-10-18 DIAGNOSIS — K219 Gastro-esophageal reflux disease without esophagitis: Secondary | ICD-10-CM | POA: Diagnosis present

## 2023-10-18 DIAGNOSIS — D472 Monoclonal gammopathy: Secondary | ICD-10-CM | POA: Diagnosis present

## 2023-10-18 DIAGNOSIS — K2100 Gastro-esophageal reflux disease with esophagitis, without bleeding: Secondary | ICD-10-CM | POA: Diagnosis present

## 2023-10-18 DIAGNOSIS — E1143 Type 2 diabetes mellitus with diabetic autonomic (poly)neuropathy: Secondary | ICD-10-CM | POA: Diagnosis present

## 2023-10-18 DIAGNOSIS — L89222 Pressure ulcer of left hip, stage 2: Secondary | ICD-10-CM | POA: Diagnosis present

## 2023-10-18 DIAGNOSIS — K7581 Nonalcoholic steatohepatitis (NASH): Secondary | ICD-10-CM | POA: Diagnosis present

## 2023-10-18 DIAGNOSIS — D132 Benign neoplasm of duodenum: Secondary | ICD-10-CM | POA: Diagnosis present

## 2023-10-18 DIAGNOSIS — Z66 Do not resuscitate: Secondary | ICD-10-CM | POA: Diagnosis present

## 2023-10-18 DIAGNOSIS — J690 Pneumonitis due to inhalation of food and vomit: Secondary | ICD-10-CM | POA: Diagnosis present

## 2023-10-18 DIAGNOSIS — K922 Gastrointestinal hemorrhage, unspecified: Secondary | ICD-10-CM | POA: Diagnosis present

## 2023-10-18 DIAGNOSIS — I69351 Hemiplegia and hemiparesis following cerebral infarction affecting right dominant side: Secondary | ICD-10-CM | POA: Diagnosis not present

## 2023-10-18 DIAGNOSIS — E1122 Type 2 diabetes mellitus with diabetic chronic kidney disease: Secondary | ICD-10-CM | POA: Diagnosis present

## 2023-10-18 DIAGNOSIS — E1140 Type 2 diabetes mellitus with diabetic neuropathy, unspecified: Secondary | ICD-10-CM | POA: Diagnosis present

## 2023-10-18 DIAGNOSIS — K2970 Gastritis, unspecified, without bleeding: Secondary | ICD-10-CM | POA: Diagnosis not present

## 2023-10-18 DIAGNOSIS — K2950 Unspecified chronic gastritis without bleeding: Secondary | ICD-10-CM | POA: Diagnosis not present

## 2023-10-18 DIAGNOSIS — Z8249 Family history of ischemic heart disease and other diseases of the circulatory system: Secondary | ICD-10-CM | POA: Diagnosis not present

## 2023-10-18 DIAGNOSIS — K921 Melena: Secondary | ICD-10-CM | POA: Diagnosis present

## 2023-10-18 DIAGNOSIS — I129 Hypertensive chronic kidney disease with stage 1 through stage 4 chronic kidney disease, or unspecified chronic kidney disease: Secondary | ICD-10-CM | POA: Diagnosis present

## 2023-10-18 DIAGNOSIS — N1831 Chronic kidney disease, stage 3a: Secondary | ICD-10-CM | POA: Diagnosis present

## 2023-10-18 DIAGNOSIS — K5641 Fecal impaction: Secondary | ICD-10-CM | POA: Diagnosis present

## 2023-10-18 DIAGNOSIS — K209 Esophagitis, unspecified without bleeding: Secondary | ICD-10-CM | POA: Diagnosis not present

## 2023-10-18 DIAGNOSIS — Z794 Long term (current) use of insulin: Secondary | ICD-10-CM | POA: Diagnosis not present

## 2023-10-18 DIAGNOSIS — Z87891 Personal history of nicotine dependence: Secondary | ICD-10-CM | POA: Diagnosis not present

## 2023-10-18 DIAGNOSIS — E785 Hyperlipidemia, unspecified: Secondary | ICD-10-CM | POA: Diagnosis present

## 2023-10-18 DIAGNOSIS — E039 Hypothyroidism, unspecified: Secondary | ICD-10-CM | POA: Diagnosis not present

## 2023-10-18 LAB — PREPARE RBC (CROSSMATCH)

## 2023-10-18 LAB — CBC
HCT: 22.6 % — ABNORMAL LOW (ref 39.0–52.0)
Hemoglobin: 6.9 g/dL — CL (ref 13.0–17.0)
MCH: 21.8 pg — ABNORMAL LOW (ref 26.0–34.0)
MCHC: 30.5 g/dL (ref 30.0–36.0)
MCV: 71.5 fL — ABNORMAL LOW (ref 80.0–100.0)
Platelets: 271 10*3/uL (ref 150–400)
RBC: 3.16 MIL/uL — ABNORMAL LOW (ref 4.22–5.81)
RDW: 21.1 % — ABNORMAL HIGH (ref 11.5–15.5)
WBC: 18.5 10*3/uL — ABNORMAL HIGH (ref 4.0–10.5)
nRBC: 0 % (ref 0.0–0.2)

## 2023-10-18 LAB — RENAL FUNCTION PANEL
Albumin: 2.6 g/dL — ABNORMAL LOW (ref 3.5–5.0)
Anion gap: 10 (ref 5–15)
BUN: 66 mg/dL — ABNORMAL HIGH (ref 8–23)
CO2: 26 mmol/L (ref 22–32)
Calcium: 9.6 mg/dL (ref 8.9–10.3)
Chloride: 102 mmol/L (ref 98–111)
Creatinine, Ser: 2.25 mg/dL — ABNORMAL HIGH (ref 0.61–1.24)
GFR, Estimated: 29 mL/min — ABNORMAL LOW (ref >60)
Glucose, Bld: 202 mg/dL — ABNORMAL HIGH (ref 70–99)
Phosphorus: 2.9 mg/dL (ref 2.5–4.6)
Potassium: 5.2 mmol/L — ABNORMAL HIGH (ref 3.5–5.1)
Sodium: 138 mmol/L (ref 135–145)

## 2023-10-18 LAB — HEMOGLOBIN A1C
Hgb A1c MFr Bld: 6.7 % — ABNORMAL HIGH (ref 4.8–5.6)
Mean Plasma Glucose: 145.59 mg/dL

## 2023-10-18 LAB — GLUCOSE, CAPILLARY
Glucose-Capillary: 179 mg/dL — ABNORMAL HIGH (ref 70–99)
Glucose-Capillary: 139 mg/dL — ABNORMAL HIGH (ref 70–99)
Glucose-Capillary: 100 mg/dL — ABNORMAL HIGH (ref 70–99)
Glucose-Capillary: 185 mg/dL — ABNORMAL HIGH (ref 70–99)

## 2023-10-18 MED ORDER — INSULIN ASPART 100 UNIT/ML IJ SOLN
0.0000 [IU] | Freq: Three times a day (TID) | INTRAMUSCULAR | Status: DC
  Administered 2023-10-20: 3 [IU] via SUBCUTANEOUS

## 2023-10-18 MED ORDER — SODIUM CHLORIDE 0.9% IV SOLUTION: Freq: Once | INTRAVENOUS | Status: AC

## 2023-10-18 NOTE — Progress Notes (Cosign Needed Addendum)
Briefly, patient presented to the ED yesterday for nausea, vomiting and hematemesis, found to be hypertensive and tachycardic. Planning for possible EGD today given HR and BP have improved, however, hgb down to 6.5, from 10.2.  2 units PRBCs ordered. Pending improvement in hgb to atleast 7, may be able to proceed with EGD later today. Nursing staff aware that endo procedure is pending improvement in hemoglobin s/p transfusion.   [name] L. Jason Hubert, MSN, APRN, AGNP-C Adult-Gerontology Nurse Practitioner [facility] at [facility]   **Addendum: as both units of blood not transfused in time for recheck of hemoglobin today, will reassess patient in the a.m, as long as he remains hemodynamically stable, will plan for EGD tomorrow.

## 2023-10-18 NOTE — Hospital Course (Signed)
[name]  is a 77 y.o. male with history of stroke with residual right-sided hemiparesis, hepatic steatosis, renal insufficiency, non-small cell lung cancer, lymphoma, MGUS, adenocarcinoma of the parotid gland, asthma, HTN, DM, Barrett's esophagus, EOE treated with steroids,  GERD, gastroparesis, hemorrhoids, colon polyps, chronic constipation, h/o hemorrhagic colitis presenting from [facility] with nausea vomiting and concern for coffee-ground emesis - Additional history obtained from patient's wife at bedside -Patient had abdominal pain -Emesis is coffee-ground-takes iron pills -CT abdomen and pelvis with possible right-sided pneumonia and fecal impaction/constipation WBC 26.7,  Hgb 10.1 >>9.3 >> 10.2 Creatinine---2.17  (baseline 1.47)

## 2023-10-18 NOTE — TOC Initial Note (Signed)
Transition of Care [facility]) - Initial/Assessment Note    Patient Details  Name: [name] MRN: [id number] Date of Birth: 1946/10/28  Transition of Care [facility]) CM/SW Contact:    Erin Sons, LCSW Phone Number: 10/18/2023, 12:58 PM  Clinical Narrative:                  CSW contacted [facility] and confirmed pt is a LTC resident there. Plan will be for pt to return at DC. TOC will continue to follow.    Expected Discharge Plan: Skilled Nursing Facility Barriers to Discharge: Continued Medical Work up   Patient Goals and CMS Choice            Expected Discharge Plan and Services                                              Prior Living Arrangements/Services     Patient language and need for interpreter reviewed:: Yes        Need for Family Participation in Patient Care: Yes (Comment) Care giver support system in place?: Yes (comment)   Criminal Activity/Legal Involvement Pertinent to Current Situation/Hospitalization: No - Comment as needed  Activities of Daily Living   ADL Screening (condition at time of admission) Independently performs ADLs?: No Does the patient have a NEW difficulty with bathing/dressing/toileting/self-feeding that is expected to last >3 days?: No Does the patient have a NEW difficulty with getting in/out of bed, walking, or climbing stairs that is expected to last >3 days?: No Does the patient have a NEW difficulty with communication that is expected to last >3 days?: No Is the patient deaf or have difficulty hearing?: No Does the patient have difficulty seeing, even when wearing glasses/contacts?: No Does the patient have difficulty concentrating, remembering, or making decisions?: No  Permission Sought/Granted                  Emotional Assessment              Admission diagnosis:  Gastrointestinal hemorrhage with hematemesis [K92.0] Hematemesis with nausea [K92.0] GIB (gastrointestinal bleeding)  [K92.2] Patient Active Problem List   Diagnosis Date Noted   GIB (gastrointestinal bleeding) 10/18/2023   Gastrointestinal hemorrhage with hematemesis 10/17/2023   Adenomatous duodenal polyp 08/24/2022   Coffee ground emesis 07/14/2022   MGUS (monoclonal gammopathy of unknown significance) 03/03/2021   Chronic obstructive pulmonary disease (HCC) 02/17/2021   Common variable immunodeficiency (HCC) 02/17/2021   Osteoarthrosis 02/17/2021   Diverticular disease 01/05/2021   History of cerebrovascular accident 01/05/2021   Hiatal hernia 01/05/2021   Hyperlipidemia 01/05/2021   Glaucoma 01/05/2021   Esotropia of left eye 01/05/2021   Benign prostatic hyperplasia 01/05/2021   History of radiation therapy 01/05/2021   Anemia 01/05/2021   GI bleed 03/18/2020   Acute lower GI bleeding 03/03/2020   Pressure injury of skin 03/03/2020   Thoracic aortic aneurysm without rupture (HCC) 11/19/2019   Hardening of the aorta (main artery of the heart) (HCC) 11/19/2019   Protein-calorie malnutrition (HCC) 06/10/2019   Avitaminosis D 03/07/2019   Male hypogonadism 07/26/2018   Gynecomastia 06/27/2018   Malignant neoplasm of upper lobe of left lung (HCC) 04/06/2018   History of malignant neoplasm of parotid gland 02/07/2018   Adenoma of large intestine 11/09/2016   Adult hypothyroidism 09/09/2015   Chronic constipation 05/22/2015   Impetigo bullosa 10/23/2013  Chronic left shoulder pain 09/25/2013   Muscle weakness (generalized) 09/25/2013   Hypercalcemia 08/08/2013   Chronic kidney disease, stage 3 unspecified (HCC) 08/07/2013   AKI (acute kidney injury) (HCC) 07/24/2013   Acute kidney failure, unspecified (HCC) 07/24/2013   Adhesive capsulitis of left shoulder 05/14/2013   GERD (gastroesophageal reflux disease)    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/23/2012   Epidermoid cyst 10/05/2012   Neck pain 08/26/2012   Chronic abdominal pain 08/26/2012   Edema of lower extremity 08/24/2012    Hemiplegia of dominant side as late effect of cerebrovascular disease (HCC) 06/06/2012   Diarrhea 04/17/2012   Alimentary obesity 04/17/2012   Type 2 diabetes mellitus with diabetic neuropathy, unspecified (HCC) 02/13/2012   Recurrent boils 01/12/2012   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) 07/15/2011   Hemorrhoids, internal 03/23/2011   Hepatitis 03/02/2011   Steatohepatitis 03/02/2011   Abdominal pain, right upper quadrant 12/16/2010   Abnormal levels of other serum enzymes 12/16/2010   Eosinophil count raised 12/23/2009   HEMATOCHEZIA 12/23/2009   Type 2 diabetes mellitus (HCC) 01/05/2009   Gout 01/05/2009   Hypertensive disorder 01/05/2009   BARRETTS ESOPHAGUS 01/05/2009   DIVERTICULOSIS OF COLON 01/05/2009   History of malignant lymphoma 01/05/2009   PCP:  [name], DO Pharmacy:   [facility] - [place], [place] - [phone] [address] [place]  [address] Phone: [phone] Fax: [phone]  [facility] - [place], [place] - [address] [place] [address] Phone: [phone] Fax: [phone]     Social Determinants of Health (SDOH) Social History: SDOH Screenings   Alcohol Screen: Low Risk  (01/26/2021)  Depression (PHQ2-9): Low Risk  (09/21/2021)  Financial Resource Strain: Low Risk  (05/29/2020)  Tobacco Use: Medium Risk (02/03/2023)   [facility]   SDOH Interventions:     Readmission Risk Interventions    07/15/2022    2:16 PM  Readmission Risk Prevention Plan  Transportation Screening Complete  PCP or Specialist Appt within 5-7 Days Complete

## 2023-10-18 NOTE — Progress Notes (Addendum)
PROGRESS NOTE    Patient: [name]                            PCP: [name], DO                    DOB: 03/19/1946            DOA: 10/17/2023 ZOX:[id number]             DOS: 10/18/2023, 11:38 AM   LOS: 0 days   Date of Service: The patient was seen and examined on 10/18/2023  Subjective:   The patient was seen and examined this morning. Hemodynamically stable, noted drop in hemoglobin, denies any further hematemesis or rectal bleed this a.m.  Brief Narrative:   Wright Gravely  is a 77 y.o. male with history of stroke with residual right-sided hemiparesis, hepatic steatosis, renal insufficiency, non-small cell lung cancer, lymphoma, MGUS, adenocarcinoma of the parotid gland, asthma, HTN, DM, Barrett's esophagus, EOE treated with steroids,  GERD, gastroparesis, hemorrhoids, colon polyps, chronic constipation, h/o hemorrhagic colitis presenting from [facility] with nausea vomiting and concern for coffee-ground emesis - Additional history obtained from patient's wife at bedside -Patient had abdominal pain -Emesis is coffee-ground-takes iron pills -CT abdomen and pelvis with possible right-sided pneumonia and fecal impaction/constipation WBC 26.7,  Hgb 10.1 >>9.3 >> 10.2 Creatinine---2.17  (baseline 1.47)    Assessment & Plan:   Principal Problem:   Gastrointestinal hemorrhage with hematemesis Active Problems:   GIB (gastrointestinal bleeding)   1) acute GI bleed:  hematemesis NPO  -Emesis is coffee-ground-takes iron pills Hgb 10.1 >>9.3 >> 10.2 >>> 6.5     Latest Ref Rng & Units 10/18/2023    7:31 AM 10/18/2023    5:52 AM 10/17/2023    1:17 PM  CBC  WBC 4.0 - 10.5 K/uL  18.5    Hemoglobin 13.0 - 17.0 g/dL 6.5  6.9  81.1   Hematocrit 39.0 - 52.0 % 21.0  22.6  35.3   Platelets 150 - 400 K/uL  271      -GI consulted >>> possible EGD today 10/18/2023 -IV Protonix every 12 hours as ordered -Transfusing 2U PRBC today    2)Severe constipation with  fecal impaction -- CT abdomen and pelvis with fecal impaction severe constipation -GI consult appreciated -Laxatives as ordered   3)  CAP--??  Aspiration related -POA: CT study shows right-sided pneumonia most likely aspiration related in the setting of recurrent emesis - WBC 26.7 -No hypoxia, afebrile, normotensive -Treat empirically with Rocephin/azithromycin, mucolytics and bronchodilators as ordered  4)history of stroke with residual right-sided hemiparesis--- aspirin and Crestor on hold   5)AKI----on CKD 3A -Creatinine---2.17  (baseline 1.47) --Hold lisinopril -Hydrate IV while NPO Lab Results  Component Value Date   CREATININE 2.25 (H) 10/18/2023   CREATININE 2.17 (H) 10/17/2023   CREATININE 2.13 (H) 05/10/2023    renally adjust medications, avoid nephrotoxic agents / dehydration  / hypotension   6)Social/ethics--- plan of care and advanced directive discussed with patient and wife at bedside MOST Form reviewed -Patient is a DNR/DNI   7)HTN---Continue amlodipine, clonidine metoprolol -Hold lisinopril due to AKI   8)DM2--no recent A1c -Continue Lantus insulin Use Novolog/Humalog Sliding scale insulin with Accu-Cheks/Fingersticks as ordered      ----------------------------------------------------------------------------------------------------------------------------------------------- Nutritional status:  The patient's BMI is: Body mass index is 24.94 kg/m. I agree with the assessment and plan as outlined below: Nutrition Status:  Skin Assessment: I have examined the patient's skin and I agree with the wound assessment as performed by wound care team As outlined belowe: Pressure Injury 03/03/20 Buttocks Right;Medial Stage 2 -  Partial thickness loss of dermis presenting as a shallow open injury with a red, pink wound bed without slough. (Active)  03/03/20 0930  Location: Buttocks  Location Orientation: Right;Medial (inner cheek)  Staging: Stage 2 -   Partial thickness loss of dermis presenting as a shallow open injury with a red, pink wound bed without slough.  Wound Description (Comments):   Present on Admission: Yes     Pressure Injury 03/03/20 Hip Left Stage 2 -  Partial thickness loss of dermis presenting as a shallow open injury with a red, pink wound bed without slough. (Active)  03/03/20 0930  Location: Hip  Location Orientation: Left  Staging: Stage 2 -  Partial thickness loss of dermis presenting as a shallow open injury with a red, pink wound bed without slough.  Wound Description (Comments):   Present on Admission: Yes    ------------------------------------------------------------------------------------------------------------------------------------------------  DVT prophylaxis:  SCDs Start: 10/17/23 0827 Place TED hose Start: 10/17/23 0827   Code Status:   Code Status: Limited: Do not attempt resuscitation (DNR) -DNR-LIMITED -Do Not Intubate/DNI   Family Communication: Wife present at bedside-updated The above findings and plan of care has been discussed with patient (and family)  in detail,  they expressed understanding and agreement of above. -Advance care planning has been discussed.   Admission status:   Status is: Inpatient Remains inpatient appropriate because: Due to ongoing GI bleed, pending evaluation, blood transfusion   Disposition: From  - home             Planning for discharge in 1-2 days: to SNF[facility]  Procedures:   No admission procedures for hospital encounter.   Antimicrobials:  Anti-infectives (From admission, onward)    Start     Dose/Rate Route Frequency Ordered Stop   10/17/23 1930  azithromycin (ZITHROMAX) 500 mg in dextrose 5 % 250 mL IVPB        500 mg 250 mL/hr over 60 Minutes Intravenous Every 24 hours 10/17/23 1830 10/22/23 1929   10/17/23 1915  cefTRIAXone (ROCEPHIN) 2 g in sodium chloride 0.9 % 100 mL IVPB        2 g 200 mL/hr over 30 Minutes Intravenous Every  24 hours 10/17/23 1830 10/22/23 1929        Medication:   amLODipine  10 mg Oral Daily   baclofen  5 mg Oral TID   buPROPion ER  100 mg Oral BID   cinacalcet  30 mg Oral Q breakfast   cyanocobalamin  1,000 mcg Oral Daily   dextromethorphan-guaiFENesin  1 tablet Oral BID   gabapentin  300 mg Oral BID   insulin glargine-yfgn  8 Units Subcutaneous QHS   ipratropium-albuterol  3 mL Nebulization TID   iron polysaccharides  150 mg Oral Daily   metoprolol tartrate  25 mg Oral BID   pantoprazole (PROTONIX) IV  40 mg Intravenous Q12H   polyethylene glycol  17 g Oral BID   senna-docusate  2 tablet Oral QHS   simvastatin  20 mg Oral q1800   sodium chloride flush  3 mL Intravenous Q12H   sodium chloride flush  3 mL Intravenous Q12H   tamsulosin  0.4 mg Oral Daily   traZODone  100 mg Oral QHS    acetaminophen **OR** acetaminophen, albuterol, bisacodyl, ondansetron **OR** ondansetron (ZOFRAN) IV, oxyCODONE-acetaminophen, sodium  chloride flush   Objective:   Vitals:   10/18/23 0823 10/18/23 1020 10/18/23 1022 10/18/23 1119  BP:  112/79  113/79  Pulse:   95 88  Resp:    16  Temp:    97.8 F (36.6 C)  TempSrc:    Axillary  SpO2: 94%   98%  Height:        Intake/Output Summary (Last 24 hours) at 10/18/2023 1138 Last data filed at 10/18/2023 0600 Gross per 24 hour  Intake 364.57 ml  Output 200 ml  Net 164.57 ml   There were no vitals filed for this visit.   Physical examination:   Constitution:  Alert, cooperative, no distress,  Appears calm and comfortable  Psychiatric:   Normal and stable mood and affect, cognition intact,   HEENT:        Normocephalic, PERRL, otherwise with in Normal limits  Chest:         Chest symmetric Cardio vascular:  S1/S2, RRR, No murmure, No Rubs or Gallops  pulmonary: Clear to auscultation bilaterally, respirations unlabored, negative wheezes / crackles Abdomen: Soft, non-tender, non-distended, bowel sounds,no masses, no organomegaly Muscular  skeletal: Hemiplegic, chronic right-sided weakness with contraction Limited exam - in bed, , right leg lymphadenopathy Neuro: Chronic right-sided hemiplegia, CNII-XII intact. , normal motor and sensation, reflexes intact  Extremities: No pitting edema lower extremities, +2 pulses  Skin: Dry, warm to touch, negative for any Rashes, No open wounds Wounds: per nursing documentation   ------------------------------------------------------------------------------------------------------------------------------------------    LABs:     Latest Ref Rng & Units 10/18/2023    7:31 AM 10/18/2023    5:52 AM 10/17/2023    1:17 PM  CBC  WBC 4.0 - 10.5 K/uL  18.5    Hemoglobin 13.0 - 17.0 g/dL 6.5  6.9  62.6   Hematocrit 39.0 - 52.0 % 21.0  22.6  35.3   Platelets 150 - 400 K/uL  271        Latest Ref Rng & Units 10/18/2023    5:01 AM 10/17/2023    7:31 AM 05/10/2023    9:41 AM  CMP  Glucose 70 - 99 mg/dL 948  546  270   BUN 8 - 23 mg/dL 66  50  44   Creatinine 0.61 - 1.24 mg/dL 3.50  0.93  8.18   Sodium 135 - 145 mmol/L 138  140  140   Potassium 3.5 - 5.1 mmol/L 5.2  4.6  4.3   Chloride 98 - 111 mmol/L 102  99  103   CO2 22 - 32 mmol/L 26  29  30    Calcium 8.9 - 10.3 mg/dL 9.6  9.5  8.9   Total Protein 6.5 - 8.1 g/dL  7.5  7.5   Total Bilirubin <1.2 mg/dL  0.3  0.2   Alkaline Phos 38 - 126 U/L  100  109   AST 15 - 41 U/L  15  10   ALT 0 - 44 U/L  16  8        Micro Results Recent Results (from the past 240 hour(s))  MRSA Next Gen by PCR, Nasal     Status: None   Collection Time: 10/17/23 11:39 AM   Specimen: Nasal Mucosa; Nasal Swab  Result Value Ref Range Status   MRSA by PCR Next Gen NOT DETECTED NOT DETECTED Final    Comment: (NOTE) The GeneXpert MRSA Assay (FDA approved for NASAL specimens only), is one component of a comprehensive MRSA colonization surveillance program.  It is not intended to diagnose MRSA infection nor to guide or monitor treatment for MRSA infections. Test  performance is not FDA approved in patients less than 51 years old. Performed at [facility], [address] [place], [place] [address]     Radiology Reports CT ABDOMEN PELVIS WO CONTRAST  Result Date: 10/17/2023 CLINICAL DATA:  Generalized abdominal pain, nausea and vomiting, coffee ground emesis EXAM: CT ABDOMEN AND PELVIS WITHOUT CONTRAST TECHNIQUE: Multidetector CT imaging of the abdomen and pelvis was performed following the standard protocol without IV contrast. Unenhanced CT was performed per clinician order. Lack of IV contrast limits sensitivity and specificity, especially for evaluation of abdominal/pelvic solid viscera. RADIATION DOSE REDUCTION: This exam was performed according to the departmental dose-optimization program which includes automated exposure control, adjustment of the mA and/or kV according to patient size and/or use of iterative reconstruction technique. COMPARISON:  07/14/2022 FINDINGS: Lower chest: There is patchy airspace disease within the dependent right lower lobe, which could reflect infection, inflammation, or aspiration. Hepatobiliary: Prior cholecystectomy. Unremarkable unenhanced appearance of the liver. No biliary duct dilation. Pancreas: Unremarkable unenhanced appearance. Spleen: Unremarkable unenhanced appearance. Adrenals/Urinary Tract: There are multiple bilateral renal hypodensities, stable since prior exam, previously characterized as benign cystic lesions that do not require specific imaging follow-up. No urinary tract calculi or obstructive uropathy within either kidney. Bladder is minimally distended, limiting its evaluation. No filling defects. The adrenals are stable. Stomach/Bowel: No bowel obstruction or ileus. There is a large amount of retained stool seen within the rectal vault which may reflect fecal impaction. Diverticulosis of the descending and sigmoid colon. Normal appendix right lower quadrant. No bowel wall thickening or inflammatory change.  Vascular/Lymphatic: Aortic atherosclerosis. No enlarged abdominal or pelvic lymph nodes. Reproductive: Prostate is unremarkable. Other: No free fluid or free intraperitoneal gas. No abdominal wall hernia. Musculoskeletal: Chronic L1 compression deformity and prior vertebral augmentation. No acute displaced fractures. Reconstructed images demonstrate no additional findings. IMPRESSION: 1. Patchy right lower lobe airspace disease which may reflect inflammation, infection, or aspiration. 2. Significant retained stool within the rectum, compatible fecal impaction. No bowel obstruction or ileus. 3. Distal colonic diverticulosis without diverticulitis. 4.  Aortic Atherosclerosis (ICD10-I70.0). Electronically Signed   By: Sharlet Salina M.D.   On: 10/17/2023 14:41    SIGNED: Kendell Bane, MD, FHM. FAAFP. [facility] - [facility] Time spent - 55 min.  In seeing, evaluating and examining the patient. Reviewing medical records, labs, drawn plan of care. [facility],  Pager (please use [email or web address] to page/ text) Please use Epic Secure Chat for non-urgent communication (7AM-7PM)  If 7PM-7AM, please contact night-coverage [email or web address], 10/18/2023, 11:38 AM

## 2023-10-19 ENCOUNTER — Inpatient Hospital Stay (HOSPITAL_COMMUNITY): Payer: Medicare Other | Admitting: Anesthesiology

## 2023-10-19 ENCOUNTER — Encounter (HOSPITAL_COMMUNITY)
Admission: EM | Disposition: A | Discharge status: Skilled Nursing Facility | Payer: Self-pay | Source: Skilled Nursing Facility | Attending: Family Medicine

## 2023-10-19 ENCOUNTER — Encounter (HOSPITAL_COMMUNITY): Payer: Self-pay | Admitting: Family Medicine

## 2023-10-19 DIAGNOSIS — D132 Benign neoplasm of duodenum: Secondary | ICD-10-CM | POA: Diagnosis not present

## 2023-10-19 DIAGNOSIS — K2100 Gastro-esophageal reflux disease with esophagitis, without bleeding: Secondary | ICD-10-CM | POA: Diagnosis not present

## 2023-10-19 DIAGNOSIS — K920 Hematemesis: Secondary | ICD-10-CM | POA: Diagnosis not present

## 2023-10-19 DIAGNOSIS — K209 Esophagitis, unspecified without bleeding: Secondary | ICD-10-CM

## 2023-10-19 DIAGNOSIS — K921 Melena: Secondary | ICD-10-CM

## 2023-10-19 DIAGNOSIS — K2970 Gastritis, unspecified, without bleeding: Secondary | ICD-10-CM

## 2023-10-19 DIAGNOSIS — K2950 Unspecified chronic gastritis without bleeding: Secondary | ICD-10-CM | POA: Diagnosis not present

## 2023-10-19 DIAGNOSIS — E039 Hypothyroidism, unspecified: Secondary | ICD-10-CM

## 2023-10-19 HISTORY — PX: BIOPSY: SHX5522

## 2023-10-19 HISTORY — PX: ESOPHAGOGASTRODUODENOSCOPY (EGD) WITH PROPOFOL: SHX5813

## 2023-10-19 LAB — BPAM RBC
Blood Product Expiration Date: 202412012359
Blood Product Expiration Date: 202412012359
ISSUE DATE / TIME: 202411061124
ISSUE DATE / TIME: 202411061528
Unit Type and Rh: 5100
Unit Type and Rh: 5100

## 2023-10-19 LAB — LACTIC ACID, PLASMA: Lactic Acid, Venous: 1.2 mmol/L (ref 0.5–1.9)

## 2023-10-19 LAB — TYPE AND SCREEN
ABO/RH(D): O POS
Antibody Screen: NEGATIVE
Unit division: 0
Unit division: 0

## 2023-10-19 LAB — HEMOGLOBIN AND HEMATOCRIT, BLOOD
HCT: 21 % — ABNORMAL LOW (ref 39.0–52.0)
Hemoglobin: 6.5 g/dL — CL (ref 13.0–17.0)

## 2023-10-19 LAB — GLUCOSE, CAPILLARY
Glucose-Capillary: 81 mg/dL (ref 70–99)
Glucose-Capillary: 94 mg/dL (ref 70–99)
Glucose-Capillary: 75 mg/dL (ref 70–99)
Glucose-Capillary: 80 mg/dL (ref 70–99)

## 2023-10-19 LAB — URINALYSIS, ROUTINE W REFLEX MICROSCOPIC
Bilirubin Urine: NEGATIVE
Glucose, UA: NEGATIVE mg/dL
Hgb urine dipstick: NEGATIVE
Ketones, ur: NEGATIVE mg/dL
Nitrite: NEGATIVE
Protein, ur: >=300 mg/dL — AB
Specific Gravity, Urine: 1.017 (ref 1.005–1.030)
pH: 5 (ref 5.0–8.0)

## 2023-10-19 LAB — CBC
HCT: 27 % — ABNORMAL LOW (ref 39.0–52.0)
Hemoglobin: 8.5 g/dL — ABNORMAL LOW (ref 13.0–17.0)
MCH: 23.7 pg — ABNORMAL LOW (ref 26.0–34.0)
MCHC: 31.5 g/dL (ref 30.0–36.0)
MCV: 75.4 fL — ABNORMAL LOW (ref 80.0–100.0)
Platelets: 219 10*3/uL (ref 150–400)
RBC: 3.58 MIL/uL — ABNORMAL LOW (ref 4.22–5.81)
RDW: 21.4 % — ABNORMAL HIGH (ref 11.5–15.5)
WBC: 20.7 10*3/uL — ABNORMAL HIGH (ref 4.0–10.5)
nRBC: 0.1 % (ref 0.0–0.2)

## 2023-10-19 LAB — PROCALCITONIN: Procalcitonin: 0.29 ng/mL

## 2023-10-19 SURGERY — ESOPHAGOGASTRODUODENOSCOPY (EGD) WITH PROPOFOL
Anesthesia: General

## 2023-10-19 MED ORDER — PROPOFOL 10 MG/ML IV BOLUS
INTRAVENOUS | Status: DC | PRN
  Administered 2023-10-19: 20 mg via INTRAVENOUS
  Administered 2023-10-19 (×2): 50 mg via INTRAVENOUS

## 2023-10-19 MED ORDER — LIDOCAINE HCL (CARDIAC) PF 100 MG/5ML IV SOSY
PREFILLED_SYRINGE | INTRAVENOUS | Status: DC | PRN
  Administered 2023-10-19: 50 mg via INTRAVENOUS

## 2023-10-19 MED ORDER — LACTATED RINGERS IV SOLN: INTRAVENOUS | Status: DC | PRN

## 2023-10-19 MED ORDER — SODIUM CHLORIDE 0.9% FLUSH: 10.0000 mL | Freq: Two times a day (BID) | INTRAVENOUS | Status: DC

## 2023-10-19 MED ORDER — SODIUM CHLORIDE 0.9 % IV SOLN: INTRAVENOUS | Status: DC

## 2023-10-19 NOTE — Progress Notes (Signed)
Returned from EGD , VSS, patient c/o pain 7/10    10/19/23 1625  Vitals  Temp 98.5 F (36.9 C)  Temp Source Oral  BP (!) 153/87  MAP (mmHg) 107  Pulse Rate 77  Resp 20  Level of Consciousness  Level of Consciousness Alert  MEWS COLOR  MEWS Score Color Green  Oxygen Therapy  SpO2 99 %  O2 Device Room Air  Pain Assessment  Pain Scale 0-10  Pain Score 7  MEWS Score  MEWS Temp 0  MEWS Systolic 0  MEWS Pulse 0  MEWS RR 0  MEWS LOC 0  MEWS Score 0

## 2023-10-19 NOTE — Progress Notes (Addendum)
Patient underwent EGD under propofol sedation.  Tolerated the procedure adequately.   FINDINGS:  - LA Grade B reflux esophagitis with no bleeding.  - Erythematous mucosa in the stomach. Biopsied. - A single duodenal polyp 2x3cm . Biopsied. Previously known to be duodenal adenoma, non bleeding  -No overt bleeding was encountered  RECOMMENDATIONS  PPI Daily  Restart diet Further management of duodenal polyp given patient goals of care   Vista Lawman, MD Gastroenterology and Hepatology [facility]

## 2023-10-19 NOTE — Interval H&P Note (Signed)
History and Physical Interval Note:  10/19/2023 3:21 PM  [name]  has presented today for surgery, with the diagnosis of hematemesis, melena.  The various methods of treatment have been discussed with the patient and family. After consideration of risks, benefits and other options for treatment, the patient has consented to  Procedure(s): ESOPHAGOGASTRODUODENOSCOPY (EGD) WITH PROPOFOL (N/A) as a surgical intervention.  The patient's history has been reviewed, patient examined, no change in status, stable for surgery.  I have reviewed the patient's chart and labs.  Questions were answered to the patient's satisfaction.    We will proceed with EGD as scheduled.    I thoroughly discussed with the patient the procedure, including the risks involved. Patient understands what the procedure involves including the benefits and any risks. Patient understands alternatives to the proposed procedure. Risks including (but not limited to) bleeding, tearing of the lining (perforation), rupture of adjacent organs, problems with heart and lung function, infection, and medication reactions. A small percentage of complications may require surgery, hospitalization, repeat endoscopic procedure, and/or transfusion.  Patient understood and agreed.    Juanetta Beets [name]

## 2023-10-19 NOTE — Anesthesia Preprocedure Evaluation (Signed)
Anesthesia Evaluation  Patient identified by MRN, date of birth, ID band Patient awake    Reviewed: Allergy & Precautions, H&P , NPO status , Patient's Chart, lab work & pertinent test results, reviewed documented beta blocker date and time   Airway Mallampati: II  TM Distance: >3 FB Neck ROM: full    Dental no notable dental hx.    Pulmonary asthma , COPD, former smoker   Pulmonary exam normal breath sounds clear to auscultation       Cardiovascular Exercise Tolerance: Good hypertension, negative cardio ROS  Rhythm:regular Rate:Normal     Neuro/Psych  Neuromuscular disease CVA, Residual Symptoms  negative psych ROS   GI/Hepatic Neg liver ROS, hiatal hernia,GERD  Medicated,,  Endo/Other  diabetesHypothyroidism    Renal/GU CRF and ARFRenal disease  negative genitourinary   Musculoskeletal   Abdominal   Peds  Hematology  (+) Blood dyscrasia, anemia   Anesthesia Other Findings   Reproductive/Obstetrics negative OB ROS                              Anesthesia Physical Anesthesia Plan  ASA: 4 and emergent  Anesthesia Plan: General   Post-op Pain Management:    Induction:   PONV Risk Score and Plan: Propofol infusion  Airway Management Planned:   Additional Equipment:   Intra-op Plan:   Post-operative Plan:   Informed Consent: I have reviewed the patients History and Physical, chart, labs and discussed the procedure including the risks, benefits and alternatives for the proposed anesthesia with the patient or authorized representative who has indicated his/her understanding and acceptance.     Dental Advisory Given  Plan Discussed with: CRNA  Anesthesia Plan Comments:          Anesthesia Quick Evaluation

## 2023-10-19 NOTE — Progress Notes (Signed)
Patient briefly seen for appropriateness for today's EGD. Hgb is improved at 8.5 after two units of prbcs. Patient reports one episode of vomiting in last 24 hours. Stool has been brown during admission. Denies sob. Does have a cough. VSS. CTA bilaterally. Mild lower abdominal tenderness. Proceed with EGD today.   Leanna Battles. [facility] [phone] 11/7/20249:55 AM

## 2023-10-19 NOTE — Progress Notes (Signed)
Informed consent for EGD signed and placed in patients medical record, Remains NPO

## 2023-10-19 NOTE — Op Note (Signed)
[facility] Patient Name: [name] Procedure Date: 10/19/2023 3:09 PM MRN: [id number] Date of Birth: [birth year] Attending MD: Sanjuan Dame , MD, [id number] CSN: [id number] Age: 77 Admit Type: Outpatient Procedure:                Upper GI endoscopy Indications:              Hematemesis Providers:                Sanjuan Dame, MD, Sheran Fava, Dyann Ruddle Referring MD:              Medicines:                Monitored Anesthesia Care Complications:            No immediate complications. Estimated Blood Loss:     Estimated blood loss was minimal. Procedure:                Pre-Anesthesia Assessment:                           - Prior to the procedure, a History and Physical                            was performed, and patient medications and                            allergies were reviewed. The patient's tolerance of                            previous anesthesia was also reviewed. The risks                            and benefits of the procedure and the sedation                            options and risks were discussed with the patient.                            All questions were answered, and informed consent                            was obtained. Prior Anticoagulants: The patient has                            taken no anticoagulant or antiplatelet agents. ASA                            Grade Assessment: III - A patient with severe                            systemic disease. After reviewing the risks and                            benefits, the patient was deemed in satisfactory  condition to undergo the procedure.                           After obtaining informed consent, the endoscope was                            passed under direct vision. Throughout the                            procedure, the patient's blood pressure, pulse, and                            oxygen saturations were monitored continuously. The                             [id number]-H190 ([id number]) scope was introduced through the                            mouth, and advanced to the second part of duodenum.                            The upper GI endoscopy was accomplished without                            difficulty. The patient tolerated the procedure                            well. Scope In: 3:27:28 PM Scope Out: 3:33:45 PM Total Procedure Duration: 0 hours 6 minutes 17 seconds  Findings:      LA Grade B (one or more mucosal breaks greater than 5 mm, not extending       between the tops of two mucosal folds) esophagitis with no bleeding was       found in the lower third of the esophagus.      Diffuse moderately erythematous mucosa without bleeding was found in the       entire examined stomach. This was biopsied with a cold forceps for       histology.      A single 20 mm sessile polyp with no bleeding was found in the second       portion of the duodenum. This was biopsied with a cold forceps for       histology.      There is no endoscopic evidence of bleeding in the entire examined       stomach. Impression:               - LA Grade B reflux esophagitis with no bleeding.                           - Erythematous mucosa in the stomach. Biopsied.                           - A single duodenal polyp 2x3cm . Biopsied.                            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, non                            bleeding                           No overt bleeding was encountered Moderate Sedation:      Per Anesthesia Care Recommendation:           PPI Daily                           Restart diet                           Further mamagement of duodenal polyp given patient                            goals of care Procedure Code(s):        --- Professional ---                           [phone], Esophagogastroduodenoscopy, flexible,                            transoral; with biopsy, single or multiple Diagnosis Code(s):        --- Professional  ---                           K21.00, Gastro-esophageal reflux disease with                            esophagitis, without bleeding                           K31.89, Other diseases of stomach and duodenum                           K31.7, Polyp of stomach and duodenum                           K92.0, Hematemesis CPT copyright 2022 [employer]. All rights reserved. The codes documented in this report are preliminary and upon coder review may  be revised to meet current compliance requirements. Sanjuan Dame, MD Sanjuan Dame, MD 10/19/2023 3:45:00 PM This report has been signed electronically. Number of Addenda: 0

## 2023-10-19 NOTE — Transfer of Care (Signed)
Immediate Anesthesia Transfer of Care Note  Patient: [name]  Procedure(s) Performed: ESOPHAGOGASTRODUODENOSCOPY (EGD) WITH PROPOFOL BIOPSY  Patient Location: PACU  Anesthesia Type:General  Level of Consciousness: awake and drowsy  Airway & Oxygen Therapy: Patient Spontanous Breathing  Post-op Assessment: Report given to RN and Post -op Vital signs reviewed and stable  Post vital signs: Reviewed and stable  Last Vitals:  Vitals Value Taken Time  BP 130/87 10/19/23 1541  Temp 37.4 C 10/19/23 1541  Pulse 94 10/19/23 1541  Resp 20 10/19/23 1541  SpO2 98 % 10/19/23 1541    Last Pain:  Vitals:   10/19/23 1526  TempSrc:   PainSc: 10-Worst pain ever      Patients Stated Pain Goal: 2 (10/19/23 1333)  Complications: No notable events documented.

## 2023-10-19 NOTE — Progress Notes (Addendum)
PROGRESS NOTE    Patient: [name]                            PCP: [name], DO                    DOB: 02-Sep-1946            DOA: 10/17/2023 GNF:[id number]             DOS: 10/19/2023, 1:50 PM   LOS: 1 day   Date of Service: The patient was seen and examined on 10/19/2023  Subjective:   The patient was seen and examined this morning, stable no acute distress, n.p.o. for GI workup today Reports of 1 episode of vomiting last night nonbloody Nursing and patient reports no further rectal bleeding overnight  Brief Narrative:   [name]  is a 77 y.o. male with history of stroke with residual right-sided hemiparesis, hepatic steatosis, renal insufficiency, non-small cell lung cancer, lymphoma, MGUS, adenocarcinoma of the parotid gland, asthma, HTN, DM, Barrett's esophagus, EOE treated with steroids,  GERD, gastroparesis, hemorrhoids, colon polyps, chronic constipation, h/o hemorrhagic colitis presenting from [facility] with nausea vomiting and concern for coffee-ground emesis - Additional history obtained from patient's wife at bedside -Patient had abdominal pain -Emesis is coffee-ground-takes iron pills -CT abdomen and pelvis with possible right-sided pneumonia and fecal impaction/constipation WBC 26.7,  Hgb 10.1 >>9.3 >> 10.2 Creatinine---2.17  (baseline 1.47)    Assessment & Plan:   Principal Problem:   Gastrointestinal hemorrhage with hematemesis Active Problems:   GIB (gastrointestinal bleeding)   1) acute GI bleed:  hematemesis  EGD: 10/19/2023 FINDINGS:   - LA Grade B reflux esophagitis with no bleeding.  - Erythematous mucosa in the stomach. Biopsied. - A single duodenal polyp 2x3cm . Biopsied     -Emesis is coffee-ground-takes iron pills Hgb 10.1 >>9.3 >> 10.2 >>> 6.5 >> 2 u PRBC >>  8.5     Latest Ref Rng & Units 10/19/2023    4:51 AM 10/18/2023    7:31 AM 10/18/2023    5:52 AM  CBC  WBC 4.0 - 10.5 K/uL 20.7   18.5   Hemoglobin  13.0 - 17.0 g/dL 8.5  6.5  C 6.9   Hematocrit 39.0 - 52.0 % 27.0  21.0  22.6   Platelets 150 - 400 K/uL 219   271       -GI consulted >>> possible EGD today 10/18/2023 -IV Protonix every 12 hours as ordered -Transfusing 2U PRBC today    2)Severe constipation with fecal impaction -- CT abdomen and pelvis with fecal impaction severe constipation -GI consult appreciated -Laxatives as ordered -Positive bowel movements x 4 now   3)  CAP--??  Aspiration related -POA: CT study shows right-sided pneumonia most likely aspiration related in the setting of recurrent emesis - WBC 26.7 >>> 20.7 -Procalcitonin 0.29, lactic acid 1.2  -No hypoxia, afebrile, normotensive -Treat empirically with Rocephin/azithromycin, mucolytics and bronchodilators as ordered  4)history of stroke with residual right-sided hemiparesis--- aspirin and Crestor on hold   5)AKI----on CKD 3A -Creatinine---2.17  (baseline 1.47) --Hold lisinopril -Hydrate IV while NPO Lab Results  Component Value Date   CREATININE 2.25 (H) 10/18/2023   CREATININE 2.17 (H) 10/17/2023   CREATININE 2.13 (H) 05/10/2023    renally adjust medications, avoid nephrotoxic agents / dehydration  / hypotension   6)Social/ethics--- plan of care and advanced directive discussed with patient and  wife at bedside MOST Form reviewed -Patient is a DNR/DNI   7)HTN---Continue amlodipine, clonidine metoprolol -Hold lisinopril due to AKI   8)DM2--no recent A1c -Continue Lantus insulin Use Novolog/Humalog Sliding scale insulin with Accu-Cheks/Fingersticks as ordered      ----------------------------------------------------------------------------------------------------------------------------------------------- Nutritional status:  The patient's BMI is: Body mass index is 19.13 kg/m. I agree with the assessment and plan as outlined below: Nutrition Status:       Skin Assessment: I have examined the patient's skin and I agree with the  wound assessment as performed by wound care team As outlined belowe: Pressure Injury 03/03/20 Buttocks Right;Medial Stage 2 -  Partial thickness loss of dermis presenting as a shallow open injury with a red, pink wound bed without slough. (Active)  03/03/20 0930  Location: Buttocks  Location Orientation: Right;Medial (inner cheek)  Staging: Stage 2 -  Partial thickness loss of dermis presenting as a shallow open injury with a red, pink wound bed without slough.  Wound Description (Comments):   Present on Admission: Yes     Pressure Injury 03/03/20 Hip Left Stage 2 -  Partial thickness loss of dermis presenting as a shallow open injury with a red, pink wound bed without slough. (Active)  03/03/20 0930  Location: Hip  Location Orientation: Left  Staging: Stage 2 -  Partial thickness loss of dermis presenting as a shallow open injury with a red, pink wound bed without slough.  Wound Description (Comments):   Present on Admission: Yes    ------------------------------------------------------------------------------------------------------------------------------------------------  DVT prophylaxis:  SCDs Start: 10/17/23 0827 Place TED hose Start: 10/17/23 0827   Code Status:   Code Status: Limited: Do not attempt resuscitation (DNR) -DNR-LIMITED -Do Not Intubate/DNI   Family Communication: Wife present at bedside-updated . -Advance care planning has been discussed.   Admission status:   Status is: Inpatient Remains inpatient appropriate because: Due to ongoing GI bleed, pending evaluation, blood transfusion   Disposition: From  - home             Planning for discharge in 1-2 days: to SNF[facility]  Procedures:   No admission procedures for hospital encounter.   Antimicrobials:  Anti-infectives (From admission, onward)    Start     Dose/Rate Route Frequency Ordered Stop   10/17/23 1930  [MAR Hold]  azithromycin (ZITHROMAX) 500 mg in dextrose 5 % 250 mL IVPB         (MAR Hold since Thu 10/19/2023 at 1328.Hold Reason: Transfer to a Procedural area)   500 mg 250 mL/hr over 60 Minutes Intravenous Every 24 hours 10/17/23 1830 10/22/23 1929   10/17/23 1915  [MAR Hold]  cefTRIAXone (ROCEPHIN) 2 g in sodium chloride 0.9 % 100 mL IVPB        (MAR Hold since Thu 10/19/2023 at 1328.Hold Reason: Transfer to a Procedural area)   2 g 200 mL/hr over 30 Minutes Intravenous Every 24 hours 10/17/23 1830 10/22/23 1929        Medication:   [MAR Hold] amLODipine  10 mg Oral Daily   [MAR Hold] baclofen  5 mg Oral TID   [MAR Hold] buPROPion ER  100 mg Oral BID   [MAR Hold] cinacalcet  30 mg Oral Q breakfast   [MAR Hold] cyanocobalamin  1,000 mcg Oral Daily   [MAR Hold] dextromethorphan-guaiFENesin  1 tablet Oral BID   [MAR Hold] gabapentin  300 mg Oral BID   [MAR Hold] insulin aspart  0-6 Units Subcutaneous TID WC   [MAR Hold] insulin glargine-yfgn  8  Units Subcutaneous QHS   [MAR Hold] iron polysaccharides  150 mg Oral Daily   [MAR Hold] metoprolol tartrate  25 mg Oral BID   [MAR Hold] pantoprazole (PROTONIX) IV  40 mg Intravenous Q12H   [MAR Hold] polyethylene glycol  17 g Oral BID   [MAR Hold] senna-docusate  2 tablet Oral QHS   [MAR Hold] simvastatin  20 mg Oral q1800   [MAR Hold] sodium chloride flush  3 mL Intravenous Q12H   [MAR Hold] sodium chloride flush  3 mL Intravenous Q12H   [MAR Hold] tamsulosin  0.4 mg Oral Daily   [MAR Hold] traZODone  100 mg Oral QHS    [MAR Hold] acetaminophen **OR** [MAR Hold] acetaminophen, [MAR Hold] albuterol, [MAR Hold] bisacodyl, [MAR Hold] ondansetron **OR** [MAR Hold] ondansetron (ZOFRAN) IV, [MAR Hold] oxyCODONE-acetaminophen, [MAR Hold] sodium chloride flush   Objective:   Vitals:   10/18/23 2042 10/19/23 0448 10/19/23 1300 10/19/23 1333  BP:  131/85 (!) 147/85 (!) 152/94  Pulse:  86 83 87  Resp:  18 16 16   Temp:  98.3 F (36.8 C) 98.3 F (36.8 C) 98.3 F (36.8 C)  TempSrc:   Oral Oral  SpO2: 98% 94% 99% 100%   Weight:    65.8 kg  Height:    6\' 1"  (1.854 m)    Intake/Output Summary (Last 24 hours) at 10/19/2023 1350 Last data filed at 10/19/2023 0500 Gross per 24 hour  Intake 722.5 ml  Output 500 ml  Net 222.5 ml   Filed Weights   10/19/23 1333  Weight: 65.8 kg     Physical examination:        General:  AAO x 3,  cooperative, no distress;   HEENT:  1 eye blindness, normocephalic, PERRL, otherwise with in Normal limits   Neuro:  CNII-XII intact. , normal motor and sensation, reflexes intact   Lungs:   Clear to auscultation BL, Respirations unlabored,  No wheezes / crackles  Cardio:    S1/S2, RRR, No murmure, No Rubs or Gallops   Abdomen:  Soft, non-tender, bowel sounds active all four quadrants, no guarding or peritoneal signs.  Muscular  skeletal:  Chronic right-sided hemiplegia, with some contraction  Limited exam -global generalized weaknesses - in bed, able to move left upper and lower extremities,   2+ pulses,  symmetric, No pitting edema  Skin:  Dry, warm to touch, negative for any Rashes,  Wounds: Please see nursing documentation  Pressure Injury 03/03/20 Buttocks Right;Medial Stage 2 -  Partial thickness loss of dermis presenting as a shallow open injury with a red, pink wound bed without slough. (Active)  03/03/20 0930  Location: Buttocks  Location Orientation: Right;Medial (inner cheek)  Staging: Stage 2 -  Partial thickness loss of dermis presenting as a shallow open injury with a red, pink wound bed without slough.  Wound Description (Comments):   Present on Admission: Yes     Pressure Injury 03/03/20 Hip Left Stage 2 -  Partial thickness loss of dermis presenting as a shallow open injury with a red, pink wound bed without slough. (Active)  03/03/20 0930  Location: Hip  Location Orientation: Left  Staging: Stage 2 -  Partial thickness loss of dermis presenting as a shallow open injury with a red, pink wound bed without slough.  Wound Description (Comments):    Present on Admission: Yes          ------------------------------------------------------------------------------------------------------------------------------------------    LABs:     Latest Ref Rng & Units 10/19/2023  4:51 AM 10/18/2023    7:31 AM 10/18/2023    5:52 AM  CBC  WBC 4.0 - 10.5 K/uL 20.7   18.5   Hemoglobin 13.0 - 17.0 g/dL 8.5  6.5  C 6.9   Hematocrit 39.0 - 52.0 % 27.0  21.0  22.6   Platelets 150 - 400 K/uL 219   271     C Corrected result      Latest Ref Rng & Units 10/18/2023    5:01 AM 10/17/2023    7:31 AM 05/10/2023    9:41 AM  CMP  Glucose 70 - 99 mg/dL 098  119  147   BUN 8 - 23 mg/dL 66  50  44   Creatinine 0.61 - 1.24 mg/dL 8.29  5.62  1.30   Sodium 135 - 145 mmol/L 138  140  140   Potassium 3.5 - 5.1 mmol/L 5.2  4.6  4.3   Chloride 98 - 111 mmol/L 102  99  103   CO2 22 - 32 mmol/L 26  29  30    Calcium 8.9 - 10.3 mg/dL 9.6  9.5  8.9   Total Protein 6.5 - 8.1 g/dL  7.5  7.5   Total Bilirubin <1.2 mg/dL  0.3  0.2   Alkaline Phos 38 - 126 U/L  100  109   AST 15 - 41 U/L  15  10   ALT 0 - 44 U/L  16  8        Micro Results Recent Results (from the past 240 hour(s))  MRSA Next Gen by PCR, Nasal     Status: None   Collection Time: 10/17/23 11:39 AM   Specimen: Nasal Mucosa; Nasal Swab  Result Value Ref Range Status   MRSA by PCR Next Gen NOT DETECTED NOT DETECTED Final    Comment: (NOTE) The GeneXpert MRSA Assay (FDA approved for NASAL specimens only), is one component of a comprehensive MRSA colonization surveillance program. It is not intended to diagnose MRSA infection nor to guide or monitor treatment for MRSA infections. Test performance is not FDA approved in patients less than 42 years old. Performed at [facility], [address], [place], [place] [address]     Radiology Reports No results found.  SIGNED: Kendell Bane, MD, FHM. FAAFP. Red[facility] Gainer - [facility] Time spent - 55 min.  In seeing, evaluating and  examining the patient. Reviewing medical records, labs, drawn plan of care. [facility],  Pager (please use [email or web address] to page/ text) Please use Epic Secure Chat for non-urgent communication (7AM-7PM)  If 7PM-7AM, please contact night-coverage [email or web address], 10/19/2023, 1:50 PM

## 2023-10-19 NOTE — Interval H&P Note (Signed)
History and Physical Interval Note:  10/19/2023 3:24 PM  [name]  has presented today for surgery, with the diagnosis of hematemesis, melena.  The various methods of treatment have been discussed with the patient and family. After consideration of risks, benefits and other options for treatment, the patient has consented to  Procedure(s): ESOPHAGOGASTRODUODENOSCOPY (EGD) WITH PROPOFOL (N/A) as a surgical intervention.  The patient's history has been reviewed, patient examined, no change in status, stable for surgery.  I have reviewed the patient's chart and labs.  Questions were answered to the patient's satisfaction.     Juanetta Beets [facility]

## 2023-10-20 ENCOUNTER — Encounter (HOSPITAL_COMMUNITY): Payer: Self-pay | Admitting: Gastroenterology

## 2023-10-20 ENCOUNTER — Telehealth: Payer: Self-pay | Admitting: Gastroenterology

## 2023-10-20 DIAGNOSIS — K920 Hematemesis: Secondary | ICD-10-CM | POA: Diagnosis not present

## 2023-10-20 LAB — CBC
HCT: 26.4 % — ABNORMAL LOW (ref 39.0–52.0)
Hemoglobin: 8.3 g/dL — ABNORMAL LOW (ref 13.0–17.0)
MCH: 24.1 pg — ABNORMAL LOW (ref 26.0–34.0)
MCHC: 31.4 g/dL (ref 30.0–36.0)
MCV: 76.7 fL — ABNORMAL LOW (ref 80.0–100.0)
Platelets: 230 10*3/uL (ref 150–400)
RBC: 3.44 MIL/uL — ABNORMAL LOW (ref 4.22–5.81)
RDW: 21.2 % — ABNORMAL HIGH (ref 11.5–15.5)
WBC: 15.3 10*3/uL — ABNORMAL HIGH (ref 4.0–10.5)
nRBC: 0.1 % (ref 0.0–0.2)

## 2023-10-20 LAB — GLUCOSE, CAPILLARY
Glucose-Capillary: 219 mg/dL — ABNORMAL HIGH (ref 70–99)
Glucose-Capillary: 274 mg/dL — ABNORMAL HIGH (ref 70–99)
Glucose-Capillary: 146 mg/dL — ABNORMAL HIGH (ref 70–99)
Glucose-Capillary: 118 mg/dL — ABNORMAL HIGH (ref 70–99)

## 2023-10-20 LAB — BASIC METABOLIC PANEL
Anion gap: 8 (ref 5–15)
BUN: 54 mg/dL — ABNORMAL HIGH (ref 8–23)
CO2: 26 mmol/L (ref 22–32)
Calcium: 8.6 mg/dL — ABNORMAL LOW (ref 8.9–10.3)
Chloride: 103 mmol/L (ref 98–111)
Creatinine, Ser: 2.33 mg/dL — ABNORMAL HIGH (ref 0.61–1.24)
GFR, Estimated: 28 mL/min — ABNORMAL LOW (ref >60)
Glucose, Bld: 149 mg/dL — ABNORMAL HIGH (ref 70–99)
Potassium: 3.4 mmol/L — ABNORMAL LOW (ref 3.5–5.1)
Sodium: 137 mmol/L (ref 135–145)

## 2023-10-20 MED ORDER — ASPIRIN ADULT LOW STRENGTH 81 MG PO TBEC: 81.0000 mg | DELAYED_RELEASE_TABLET | Freq: Every day | ORAL | Status: DC

## 2023-10-20 MED ORDER — GABAPENTIN 300 MG PO CAPS: 300.0000 mg | ORAL_CAPSULE | Freq: Two times a day (BID) | ORAL | 0 refills | Status: DC

## 2023-10-20 MED ORDER — PANTOPRAZOLE SODIUM 40 MG PO TBEC
40.0000 mg | DELAYED_RELEASE_TABLET | Freq: Two times a day (BID) | ORAL | Status: DC
  Filled 2023-10-20: qty 1

## 2023-10-20 MED ORDER — LEVOFLOXACIN 500 MG PO TABS: 500.0000 mg | ORAL_TABLET | Freq: Every day | ORAL | 0 refills | Status: AC

## 2023-10-20 MED ORDER — INSULIN LISPRO (1 UNIT DIAL) 100 UNIT/ML (KWIKPEN): PEN_INJECTOR | SUBCUTANEOUS | Status: DC

## 2023-10-20 MED ORDER — LEVOFLOXACIN 500 MG PO TABS: 500.0000 mg | ORAL_TABLET | ORAL | Status: DC

## 2023-10-20 MED ORDER — POTASSIUM CHLORIDE CRYS ER 20 MEQ PO TBCR
40.0000 meq | EXTENDED_RELEASE_TABLET | Freq: Once | ORAL | Status: AC
  Administered 2023-10-20: 40 meq via ORAL
  Filled 2023-10-20: qty 2

## 2023-10-20 MED ORDER — LEVOFLOXACIN 500 MG PO TABS
500.0000 mg | ORAL_TABLET | Freq: Every day | ORAL | Status: DC
  Administered 2023-10-20: 500 mg via ORAL
  Filled 2023-10-20: qty 1

## 2023-10-20 NOTE — Care Management Important Message (Signed)
Important Message  Patient Details  Name: [name] MRN: [id number] Date of Birth: [birth year]   Important Message Given:  Yes - Medicare IM     Corey Harold 10/20/2023, 11:42 AM

## 2023-10-20 NOTE — Progress Notes (Signed)
Report given to Herbert Seta, RN at [employer].

## 2023-10-20 NOTE — Discharge Summary (Addendum)
Physician Discharge Summary   Patient: [name] MRN: [id number] DOB: 1946-02-13  Admit date:     10/17/2023  Discharge date: 10/20/23  Discharge Physician: Kendell Bane   PCP: [name], DO   Recommendations at discharge:   Follow-up with PCP in 1 week Follow-up with gastroenterologist in 2-4 weeks CBC and BMP in 1 week Continue Protonix twice a day Complete oral antibiotic Levaquin  Discharge Diagnoses: Principal Problem:   Gastrointestinal hemorrhage with hematemesis Active Problems:   GIB (gastrointestinal bleeding)   Gastritis and gastroduodenitis   Duodenal adenoma  Resolved Problems:   * No resolved hospital problems. *  Hospital Course: [name]  is a 77 y.o. male with history of stroke with residual right-sided hemiparesis, hepatic steatosis, renal insufficiency, non-small cell lung cancer, lymphoma, MGUS, adenocarcinoma of the parotid gland, asthma, HTN, DM, Barrett's esophagus, EOE treated with steroids,  GERD, gastroparesis, hemorrhoids, colon polyps, chronic constipation, h/o hemorrhagic colitis presenting from [facility] with nausea vomiting and concern for coffee-ground emesis - Additional history obtained from patient's wife at bedside -Patient had abdominal pain -Emesis is coffee-ground-takes iron pills -CT abdomen and pelvis with possible right-sided pneumonia and fecal impaction/constipation WBC 26.7,  Hgb 10.1 >>9.3 >> 10.2 Creatinine---2.17  (baseline 1.47)   Acute GI bleed:  with hematemesis   EGD: 10/19/2023 FINDINGS:   - LA Grade B reflux esophagitis with no bleeding.  - Erythematous mucosa in the stomach. Biopsied. - A single duodenal polyp 2x3cm . Biopsied      -Emesis is coffee-ground-takes iron pills Hgb 10.1 >>9.3 >> 10.2 >>> 6.5 >> 2 u PRBC >>  8.5, 8.3      Latest Ref Rng & Units 10/20/2023    4:02 AM 10/19/2023    4:51 AM 10/18/2023    7:31 AM  CBC  WBC 4.0 - 10.5 K/uL 15.3  20.7    Hemoglobin  13.0 - 17.0 g/dL 8.3  8.5  6.5  C  Hematocrit 39.0 - 52.0 % 26.4  27.0  21.0   Platelets 150 - 400 K/uL 230  219          -GI consulted >>> possible EGD today 10/18/2023 -IV Protonix every 12 hours as ordered -Transfusing 2U PRBC today       2)Severe constipation with fecal impaction -- CT abdomen and pelvis with fecal impaction severe constipation -GI consult appreciated -Laxatives as ordered Status post multiple bowel movement   3)  CAP--??  Aspiration related -POA: CT study shows right-sided pneumonia most likely aspiration related in the setting of recurrent emesis - WBC 26.7 >>> 20.7 >>15.3    -Procalcitonin 0.29, lactic acid 1.2   -No hypoxia, afebrile, normotensive -Was on IV Abx of  Rocephin/azithromycin>> switch to p.o.Abx  Levaquin  - Mucolytics and bronchodilators as needed    4)history of stroke with residual right-sided hemiparesis--- aspirin and Crestor on hold   5)AKI----on CKD 3A -Creatinine---2.17  (baseline 1.47)  -Encourage oral hydration  Lab Results  Component Value Date   CREATININE 2.33 (H) 10/20/2023   CREATININE 2.25 (H) 10/18/2023   CREATININE 2.17 (H) 10/17/2023      renally adjust medications, avoid nephrotoxic agents / dehydration  / hypotension   6)Social/ethics--- plan of care and advanced directive discussed with patient and wife at bedside MOST Form reviewed -Patient is a DNR/DNI   7)HTN---Continue amlodipine, clonidine metoprolol -   8)DM2--no recent A1c -Continue Lantus insulin Use Novolog/Humalog Sliding scale insulin with Accu-Cheks/Fingersticks as ordered    -----------------------------------------------------------------------------------------------------------------  Skin Assessment: I have examined the patient's skin and I agree with the wound assessment as performed by wound care team As outlined belowe: Pressure Injury 03/03/20 Buttocks Right;Medial Stage 2 -  Partial thickness loss of dermis presenting as a  shallow open injury with a red, pink wound bed without slough. (Active)  03/03/20 0930  Location: Buttocks  Location Orientation: Right;Medial (inner cheek)  Staging: Stage 2 -  Partial thickness loss of dermis presenting as a shallow open injury with a red, pink wound bed without slough.  Wound Description (Comments):   Present on Admission: Yes     Pressure Injury 03/03/20 Hip Left Stage 2 -  Partial thickness loss of dermis presenting as a shallow open injury with a red, pink wound bed without slough. (Active)  03/03/20 0930  Location: Hip  Location Orientation: Left  Staging: Stage 2 -  Partial thickness loss of dermis presenting as a shallow open injury with a red, pink wound bed without slough.  Wound Description (Comments):   Present on Admission: Yes      ------------------------------------------------------------------------------------------------------------------     Code Status:   Code Status: Limited: Do not attempt resuscitation (DNR) -DNR-LIMITED -Do Not Intubate/DNI         Consultants: Gastroenterologist Procedures performed: S/p Disposition: Skilled nursing facility Diet recommendation:  Discharge Diet Orders (From admission, onward)     Start     Ordered   10/20/23 0000  Diet - low sodium heart healthy        10/20/23 1128           Regular diet DISCHARGE MEDICATION: Allergies as of 10/20/2023   No Known Allergies      Medication List     STOP taking these medications    cloNIDine 0.2 MG tablet Commonly known as: CATAPRES   ferrous sulfate 324 (65 Fe) MG Tbec   lisinopril 20 MG tablet Commonly known as: ZESTRIL   mirtazapine 15 MG tablet Commonly known as: REMERON   simvastatin 20 MG tablet Commonly known as: ZOCOR       TAKE these medications    acetaminophen 650 MG CR tablet Commonly known as: TYLENOL Take 650 mg by mouth every 4 (four) hours as needed for pain.   allopurinol 300 MG tablet Commonly known as:  ZYLOPRIM TAKE 1 TABLET(300 MG) BY MOUTH DAILY What changed:  how much to take how to take this when to take this   amLODipine 10 MG tablet Commonly known as: NORVASC Take 1 tablet (10 mg total) by mouth daily.   Aspirin Adult Low Strength 81 MG tablet Generic drug: aspirin EC Take 1 tablet (81 mg total) by mouth daily. Restart in 1 week Start taking on: October 23, 2023 What changed: These instructions start on October 23, 2023. If you are unsure what to do until then, ask your doctor or other care provider.   b complex-vitamin c-folic acid 0.8 MG Tabs tablet Take 1 tablet by mouth at bedtime.   Baclofen 5 MG Tabs Take 1 tablet by mouth 3 (three) times daily.   buPROPion ER 100 MG 12 hr tablet Commonly known as: WELLBUTRIN SR Take 100 mg by mouth 2 (two) times daily.   cinacalcet 30 MG tablet Commonly known as: SENSIPAR Take 30 mg by mouth daily with breakfast.   cyanocobalamin 1000 MCG tablet Commonly known as: VITAMIN B12 Take 1 tablet (1,000 mcg total) by mouth daily.   furosemide 20 MG tablet Commonly known as: LASIX Take 1 tablet (20 mg  total) by mouth daily. Start taking on: October 23, 2023 What changed: These instructions start on October 23, 2023. If you are unsure what to do until then, ask your doctor or other care provider.   gabapentin 300 MG capsule Commonly known as: NEURONTIN Take 1 capsule (300 mg total) by mouth 2 (two) times daily. What changed: See the new instructions.   insulin lispro 100 UNIT/ML KwikPen Commonly known as: HumaLOG KwikPen Per sliding scale CBG 70 - 120:   0 units CBG 121 -150: 1 units  CBG 151 - 200: 2 unit  CBG 201-250:   4 units  CBG 251-300:   6 units  CBG 301-350:  8 units  CBG 351-400: 10 units  > 450 call MD What changed:  how to take this when to take this additional instructions   ipratropium-albuterol 0.5-2.5 (3) MG/3ML Soln Commonly known as: DUONEB Take 3 mLs by nebulization every 6 (six) hours.    iron polysaccharides 150 MG capsule Commonly known as: NIFEREX TAKE 1 CAPSULE(150 MG) BY MOUTH DAILY What changed: See the new instructions.   Lantus SoloStar 100 UNIT/ML Solostar Pen Generic drug: insulin glargine Inject 30 Units into the skin at bedtime. What changed: how much to take   levofloxacin 500 MG tablet Commonly known as: LEVAQUIN Take 1 tablet (500 mg total) by mouth daily for 3 days. Start taking on: October 21, 2023   metoprolol tartrate 50 MG tablet Commonly known as: LOPRESSOR Take 50 mg by mouth daily.   Motegrity 2 MG Tabs Generic drug: Prucalopride Succinate Take 1 tablet (2 mg total) by mouth daily.   ondansetron 4 MG disintegrating tablet Commonly known as: ZOFRAN-ODT Take 4 mg by mouth every 6 (six) hours as needed for nausea or vomiting.   oxyCODONE-acetaminophen 7.5-325 MG tablet Commonly known as: Percocet Take 1 tablet by mouth every 4 (four) hours as needed.   pantoprazole 40 MG tablet Commonly known as: PROTONIX Take 1 tablet (40 mg total) by mouth 2 (two) times daily. What changed:  how much to take how to take this when to take this additional instructions   polyethylene glycol powder 17 GM/SCOOP powder Commonly known as: MiraLax Take one capful daily. Hold for diarrhea.   rosuvastatin 20 MG tablet Commonly known as: CRESTOR Take 20 mg by mouth daily.   tamsulosin 0.4 MG Caps capsule Commonly known as: FLOMAX TAKE 1 CAPSULE(0.4 MG) BY MOUTH DAILY What changed:  how much to take how to take this when to take this additional instructions   traZODone 100 MG tablet Commonly known as: DESYREL Take 100 mg by mouth at bedtime.        Discharge Exam: Filed Weights   10/19/23 1333  Weight: 65.8 kg        General:  AAO x 3,  cooperative, no distress;   HEENT:  Normocephalic, PERRL, otherwise with in Normal limits   Neuro:  Right-sided hemiaplasia CNII-XII intact.  Reduced motor and sensation on the right with some  contractions-moving left upper and lower extremity sensation intact  Lungs:   Clear to auscultation BL, Respirations unlabored,  No wheezes / crackles  Cardio:    S1/S2, RRR, No murmure, No Rubs or Gallops   Abdomen:  Soft, non-tender, bowel sounds active all four quadrants, no guarding or peritoneal signs.  Muscular  skeletal:  Chronic hemiplegia with right-sided weakness due to previous stroke  Limited exam -global generalized weaknesses - in bed, able to move left upper and lower extremities,  2+ pulses,  symmetric, No pitting edema  Skin:  Dry, warm to touch, negative for any Rashes,  Wounds: Please see nursing documentation  Pressure Injury 03/03/20 Buttocks Right;Medial Stage 2 -  Partial thickness loss of dermis presenting as a shallow open injury with a red, pink wound bed without slough. (Active)  03/03/20 0930  Location: Buttocks  Location Orientation: Right;Medial (inner cheek)  Staging: Stage 2 -  Partial thickness loss of dermis presenting as a shallow open injury with a red, pink wound bed without slough.  Wound Description (Comments):   Present on Admission: Yes     Pressure Injury 03/03/20 Hip Left Stage 2 -  Partial thickness loss of dermis presenting as a shallow open injury with a red, pink wound bed without slough. (Active)  03/03/20 0930  Location: Hip  Location Orientation: Left  Staging: Stage 2 -  Partial thickness loss of dermis presenting as a shallow open injury with a red, pink wound bed without slough.  Wound Description (Comments):   Present on Admission: Yes          Condition at discharge: fair  The results of significant diagnostics from this hospitalization (including imaging, microbiology, ancillary and laboratory) are listed below for reference.   Imaging Studies: CT ABDOMEN PELVIS WO CONTRAST  Result Date: 10/17/2023 CLINICAL DATA:  Generalized abdominal pain, nausea and vomiting, coffee ground emesis EXAM: CT ABDOMEN AND PELVIS WITHOUT  CONTRAST TECHNIQUE: Multidetector CT imaging of the abdomen and pelvis was performed following the standard protocol without IV contrast. Unenhanced CT was performed per clinician order. Lack of IV contrast limits sensitivity and specificity, especially for evaluation of abdominal/pelvic solid viscera. RADIATION DOSE REDUCTION: This exam was performed according to the departmental dose-optimization program which includes automated exposure control, adjustment of the mA and/or kV according to patient size and/or use of iterative reconstruction technique. COMPARISON:  07/14/2022 FINDINGS: Lower chest: There is patchy airspace disease within the dependent right lower lobe, which could reflect infection, inflammation, or aspiration. Hepatobiliary: Prior cholecystectomy. Unremarkable unenhanced appearance of the liver. No biliary duct dilation. Pancreas: Unremarkable unenhanced appearance. Spleen: Unremarkable unenhanced appearance. Adrenals/Urinary Tract: There are multiple bilateral renal hypodensities, stable since prior exam, previously characterized as benign cystic lesions that do not require specific imaging follow-up. No urinary tract calculi or obstructive uropathy within either kidney. Bladder is minimally distended, limiting its evaluation. No filling defects. The adrenals are stable. Stomach/Bowel: No bowel obstruction or ileus. There is a large amount of retained stool seen within the rectal vault which may reflect fecal impaction. Diverticulosis of the descending and sigmoid colon. Normal appendix right lower quadrant. No bowel wall thickening or inflammatory change. Vascular/Lymphatic: Aortic atherosclerosis. No enlarged abdominal or pelvic lymph nodes. Reproductive: Prostate is unremarkable. Other: No free fluid or free intraperitoneal gas. No abdominal wall hernia. Musculoskeletal: Chronic L1 compression deformity and prior vertebral augmentation. No acute displaced fractures. Reconstructed images  demonstrate no additional findings. IMPRESSION: 1. Patchy right lower lobe airspace disease which may reflect inflammation, infection, or aspiration. 2. Significant retained stool within the rectum, compatible fecal impaction. No bowel obstruction or ileus. 3. Distal colonic diverticulosis without diverticulitis. 4.  Aortic Atherosclerosis (ICD10-I70.0). Electronically Signed   By: Sharlet Salina M.D.   On: 10/17/2023 14:41    Microbiology: Results for orders placed or performed during the hospital encounter of 10/17/23  MRSA Next Gen by PCR, Nasal     Status: None   Collection Time: 10/17/23 11:39 AM   Specimen: Nasal Mucosa; Nasal Swab  Result Value Ref Range Status   MRSA by PCR Next Gen NOT DETECTED NOT DETECTED Final    Comment: (NOTE) The GeneXpert MRSA Assay (FDA approved for NASAL specimens only), is one component of a comprehensive MRSA colonization surveillance program. It is not intended to diagnose MRSA infection nor to guide or monitor treatment for MRSA infections. Test performance is not FDA approved in patients less than 51 years old. Performed at [facility], [address] [place], [place] [address]    *Note: Due to a large number of results and/or encounters for the requested time period, some results have not been displayed. A complete set of results can be found in Results Review.    Labs: CBC: Recent Labs  Lab 10/17/23 0731 10/17/23 1317 10/18/23 0552 10/18/23 0731 10/19/23 0451 10/20/23 0402  WBC 26.7*  --  18.5*  --  20.7* 15.3*  HGB 9.3* 10.2* 6.9* 6.5* 8.5* 8.3*  HCT 31.7* 35.3* 22.6* 21.0* 27.0* 26.4*  MCV 71.2*  --  71.5*  --  75.4* 76.7*  PLT 439*  --  271  --  219 230   Basic Metabolic Panel: Recent Labs  Lab 10/17/23 0731 10/18/23 0501 10/20/23 0402  NA 140 138 137  K 4.6 5.2* 3.4*  CL 99 102 103  CO2 29 26 26   GLUCOSE 295* 202* 149*  BUN 50* 66* 54*  CREATININE 2.17* 2.25* 2.33*  CALCIUM 9.5 9.6 8.6*  PHOS  --  2.9  --    Liver  Function Tests: Recent Labs  Lab 10/17/23 0731 10/18/23 0501  AST 15  --   ALT 16  --   ALKPHOS 100  --   BILITOT 0.3  --   PROT 7.5  --   ALBUMIN 2.7* 2.6*   CBG: Recent Labs  Lab 10/19/23 1639 10/19/23 2052 10/20/23 0732 10/20/23 1134 10/20/23 1627  GLUCAP 80 219* 274* 146* 118*    Discharge time spent: greater than 40 minutes.  Signed: Kendell Bane, MD [facility] 10/20/2023

## 2023-10-20 NOTE — TOC Transition Note (Signed)
Transition of Care [facility]) - CM/SW Discharge Note   Patient Details  Name: [name] MRN: [id number] Date of Birth: 09-09-1946  Transition of Care [facility]) CM/SW Contact:  Elliot Gault, LCSW Phone Number: 10/20/2023, 11:47 AM   Clinical Narrative:     Pt medically stable for dc back to [facility] today per MD. Updated Debbie at [facility] who states they can re-admit pt today.  DC clinical sent electronically. RN to call report. EMS arranged.   No other TOC needs for dc.  Final next level of care: Skilled Nursing Facility Barriers to Discharge: Barriers Resolved   Patient Goals and CMS Choice      Discharge Placement                         Discharge Plan and Services Additional resources added to the After Visit Summary for                                       Social Determinants of Health (SDOH) Interventions SDOH Screenings   Alcohol Screen: Low Risk  (01/26/2021)  Depression (PHQ2-9): Low Risk  (09/21/2021)  Financial Resource Strain: Low Risk  (05/29/2020)  Tobacco Use: Medium Risk (10/19/2023)     Readmission Risk Interventions    07/15/2022    2:16 PM  Readmission Risk Prevention Plan  Transportation Screening Complete  PCP or Specialist Appt within 5-7 Days Complete

## 2023-10-20 NOTE — Progress Notes (Signed)
Patient not physically examined today.  Chart reviewed given he is discharging back to facility today.  Stable at 8.3 from 8.5 yesterday.  He has received 2 units of PRBCs this admission for drop in hemoglobin from 10-6.9.  He underwent EGD 10/19/2023: -LA grade B reflux esophagitis without bleeding -Gastritis s/p biopsy -Duodenal polyp 2 x 3 cm s/p biopsy without bleeding  Recommended daily PPI and to resume diet.  Patient should also continue daily iron therapy.  He needs to continue good bowel regimen with Motegrity and MiraLAX twice daily as this is likely cause of his right lower quadrant abdominal pain.  He is in need of outpatient plan of care consult per recent visit with oncology.  Has needed updated colonoscopy for quite some time however in light of small cell lung cancer as well as MGUS with concerns for multiple myeloma, goals of care discussions should be ongoing and given prior stroke he is not a good candidate for colonoscopy.    Brooke Bonito, MSN, APRN, FNP-BC, AGACNP-BC [facility] at [facility]

## 2023-10-20 NOTE — Evaluation (Signed)
Occupational Therapy Evaluation Patient Details Name: [name] MRN: [id number] DOB: February 26, 1946 Today's Date: 10/20/2023   History of Present Illness [name]  is a 77 y.o. male with history of stroke with residual right-sided hemiparesis, hepatic steatosis, renal insufficiency, non-small cell lung cancer, lymphoma, MGUS, adenocarcinoma of the parotid gland, asthma, HTN, DM, Barrett's esophagus, EOE treated with steroids,  GERD, gastroparesis, hemorrhoids, colon polyps, chronic constipation, h/o hemorrhagic colitis presenting from [facility] with nausea vomiting and concern for coffee-ground emesis   Clinical Impression   Pt was up in bed eating breakfast upon arrival, agreeable to OT evaluation. Per chart review, pt came from [facility]. Pt has prior CVA with total R sided hemiparesis. Pt able to move L UE and LLE in bed. Pt was self feeding himself breakfast with fingers and washed face with washcloth with LUE with set up. Pt required mod assist to sit up in bed in long sitting position, he was able to hold OT hand with L hand to pull up and use LLE to push up in bed. Pt should d/c back to SNF. He would benefit from OT while in acute care setting.       If plan is discharge home, recommend the following: A lot of help with walking and/or transfers;A lot of help with bathing/dressing/bathroom;Assistance with cooking/housework;Direct supervision/assist for medications management;Direct supervision/assist for financial management    Functional Status Assessment  Patient has had a recent decline in their functional status and/or demonstrates limited ability to make significant improvements in function in a reasonable and predictable amount of time  Equipment Recommendations  None recommended by OT       Precautions / Restrictions Precautions Precautions: Fall Restrictions Weight Bearing Restrictions: No      Mobility Bed Mobility Overal bed mobility: Needs  Assistance Bed Mobility: Supine to Sit     Supine to sit: Mod assist     General bed mobility comments: pt required mod assist to go from supine to long sittign in bed. Max assist to manage R side of body, able to pullup with L hand and move LLE    Transfers Overall transfer level: Needs assistance                 General transfer comment: Did not attempt t/f due to hemiparesis and use of w/c for mobility (no w/c in room)      Balance Overall balance assessment: Needs assistance                                         ADL either performed or assessed with clinical judgement   ADL Overall ADL's : At baseline;Needs assistance/impaired Eating/Feeding: Set up;Minimal assistance   Grooming: Set up Grooming Details (indicate cue type and reason): Pt washed and dreid face with set up Upper Body Bathing: Moderate assistance   Lower Body Bathing: Maximal assistance   Upper Body Dressing : Moderate assistance   Lower Body Dressing: Maximal assistance   Toilet Transfer: Maximal assistance   Toileting- Clothing Manipulation and Hygiene: Maximal assistance   Tub/ Shower Transfer: Maximal assistance   Functional mobility during ADLs: Wheelchair (pe) General ADL Comments: pt requires mod/max assist at baseline     Vision Baseline Vision/History: 0 No visual deficits Ability to See in Adequate Light: 0 Adequate Patient Visual Report: No change from baseline Vision Assessment?: No apparent visual deficits  Pertinent Vitals/Pain Pain Assessment Pain Assessment: No/denies pain     Extremity/Trunk Assessment Upper Extremity Assessment Upper Extremity Assessment: Overall WFL for tasks assessed;RUE deficits/detail RUE Deficits / Details: Hemiparesis RUE:  (Hemiparesis due to prior CVA) RUE Sensation: decreased light touch RUE Coordination: decreased fine motor;decreased gross motor   Lower Extremity Assessment Lower Extremity Assessment:  Defer to PT evaluation   Cervical / Trunk Assessment Cervical / Trunk Assessment: Normal   Communication Communication Communication: Difficulty communicating thoughts/reduced clarity of speech Cueing Techniques: Verbal cues   Cognition Arousal: Alert Behavior During Therapy: WFL for tasks assessed/performed Overall Cognitive Status: Within Functional Limits for tasks assessed                                 General Comments: pt demonstrates some aphasia from previous CVA, he answers yes/no questions but cannot answer questions in detail.     General Comments               Home Living Family/patient expects to be discharged to:: Skilled nursing facility                                        Prior Functioning/Environment Prior Level of Function : Needs assist       Physical Assist : Mobility (physical);ADLs (physical) Mobility (physical): Bed mobility;Transfers ADLs (physical): Feeding;Grooming;Bathing;Dressing;IADLs;Toileting Mobility Comments: Pt reports that he uses a w/c for mobility ADLs Comments: Pt stated that PLOF he requ8ires assit for ADLS but he is able to put head and L arm though holes of shirt. He is able to self feed with set up. Pt completes groomin with set up        OT Problem List: Decreased strength;Decreased activity tolerance      OT Treatment/Interventions: Self-care/ADL training;Therapeutic exercise;Therapeutic activities;DME and/or AE instruction    OT Goals(Current goals can be found in the care plan section) Acute Rehab OT Goals Patient Stated Goal: to return to rehab OT Goal Formulation: With patient Potential to Achieve Goals: Fair  OT Frequency: Min 1X/week       AM-PAC OT "6 Clicks" Daily Activity     Outcome Measure Help from another person eating meals?: A Little Help from another person taking care of personal grooming?: A Little Help from another person toileting, which includes using toliet,  bedpan, or urinal?: Total Help from another person bathing (including washing, rinsing, drying)?: A Lot Help from another person to put on and taking off regular upper body clothing?: A Lot Help from another person to put on and taking off regular lower body clothing?: A Lot 6 Click Score: 13   End of Session    Activity Tolerance: Patient tolerated treatment well Patient left: in bed;with call bell/phone within reach;with bed alarm set  OT Visit Diagnosis: Muscle weakness (generalized) (M62.81)                Time: 5409-8119 OT Time Calculation (min): 20 min Charges:  OT General Charges $OT Visit: 1 Visit OT Evaluation $OT Eval Moderate Complexity: 1 Mod  Bevelyn Ngo, OTR/L  10/20/2023, 9:51 AM

## 2023-10-20 NOTE — Progress Notes (Signed)
PT Cancellation Note  Patient Details Name: [name] MRN: [id number] DOB: 10-31-1946   Per MD: Expand All Collapse All   [facility] Provider Note   CSN: [id number] Arrival date & time: 10/17/23 5956   Chief Complaint(s) Hematemesis   HPI [name] is a 77 y.o. male with PMH Barrett's esophagus, BPH, parotid adenocarcinoma, previous CVA with right-sided hemiparesis and dysarthria, T2DM, GERD, HLD, diverticulosis, hospital admission in 2023 for upper GI bleed secondary to hemorrhagic gastric polyp who presents Emergency Department for evaluation of hematemesis     Therapist enters room to a polite male who states he has been paralyzed for years and does not walk, he has a lift at home.   Pt is not appropriate for PT as he is at his prior physical states Virgina Organ, PT CLT [phone]  10/20/2023, 8:38 AM

## 2023-10-20 NOTE — Progress Notes (Signed)
IV and malewick removed. Ems transport to [facility]

## 2023-10-20 NOTE — Plan of Care (Signed)
  Problem: Acute Rehab OT Goals (only OT should resolve) Goal: Pt. Will Perform Upper Body Bathing Flowsheets (Taken 10/20/2023 0958) Pt Will Perform Upper Body Bathing: with min assist Goal: Pt. Will Perform Upper Body Dressing Flowsheets (Taken 10/20/2023 0958) Pt Will Perform Upper Body Dressing: with min assist Goal: Pt/Caregiver Will Perform Home Exercise Program Flowsheets (Taken 10/20/2023 [phone]) Pt/caregiver will Perform Home Exercise Program:  Left upper extremity  With Supervision  With written HEP provided  Lurena Joiner, OTR/L

## 2023-10-20 NOTE — NC FL2 (Signed)
[place] MEDICAID FL2 LEVEL OF CARE FORM     IDENTIFICATION  Patient Name: [name] Birthdate: [birth year] Sex: male Admission Date (Current Location): 10/17/2023  [facility] and [place] Number:  [employer] and Address:  [facility],  [address] [place] [address]      Provider Number: [id number]  Attending Physician Name and Address:  Kendell Bane, MD  Relative Name and Phone Number:       Current Level of Care: Hospital Recommended Level of Care: Skilled Nursing Facility Prior Approval Number:    Date Approved/Denied:   PASRR Number:    Discharge Plan: SNF    Current Diagnoses: Patient Active Problem List   Diagnosis Date Noted   Gastritis and gastroduodenitis 10/19/2023   Duodenal adenoma 10/19/2023   GIB (gastrointestinal bleeding) 10/18/2023   Gastrointestinal hemorrhage with hematemesis 10/17/2023   Adenomatous duodenal polyp 08/24/2022   Coffee ground emesis 07/14/2022   MGUS (monoclonal gammopathy of unknown significance) 03/03/2021   Chronic obstructive pulmonary disease (HCC) 02/17/2021   Common variable immunodeficiency (HCC) 02/17/2021   Osteoarthrosis 02/17/2021   Diverticular disease 01/05/2021   History of cerebrovascular accident 01/05/2021   Hiatal hernia 01/05/2021   Hyperlipidemia 01/05/2021   Glaucoma 01/05/2021   Esotropia of left eye 01/05/2021   Benign prostatic hyperplasia 01/05/2021   History of radiation therapy 01/05/2021   Anemia 01/05/2021   GI bleed 03/18/2020   Acute lower GI bleeding 03/03/2020   Pressure injury of skin 03/03/2020   Thoracic aortic aneurysm without rupture (HCC) 11/19/2019   Hardening of the aorta (main artery of the heart) (HCC) 11/19/2019   Protein-calorie malnutrition (HCC) 06/10/2019   Avitaminosis D 03/07/2019   Male hypogonadism 07/26/2018   Gynecomastia 06/27/2018   Malignant neoplasm of upper lobe of left lung (HCC) 04/06/2018   History of malignant neoplasm of  parotid gland 02/07/2018   Adenoma of large intestine 11/09/2016   Adult hypothyroidism 09/09/2015   Chronic constipation 05/22/2015   Impetigo bullosa 10/23/2013   Chronic left shoulder pain 09/25/2013   Muscle weakness (generalized) 09/25/2013   Hypercalcemia 08/08/2013   Chronic kidney disease, stage 3 unspecified (HCC) 08/07/2013   AKI (acute kidney injury) (HCC) 07/24/2013   Acute kidney failure, unspecified (HCC) 07/24/2013   Adhesive capsulitis of left shoulder 05/14/2013   GERD (gastroesophageal reflux disease)    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/23/2012   Epidermoid cyst 10/05/2012   Neck pain 08/26/2012   Chronic abdominal pain 08/26/2012   Edema of lower extremity 08/24/2012   Hemiplegia of dominant side as late effect of cerebrovascular disease (HCC) 06/06/2012   Diarrhea 04/17/2012   Alimentary obesity 04/17/2012   Type 2 diabetes mellitus with diabetic neuropathy, unspecified (HCC) 02/13/2012   Recurrent boils 01/12/2012   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) 07/15/2011   Hemorrhoids, internal 03/23/2011   Hepatitis 03/02/2011   Steatohepatitis 03/02/2011   Abdominal pain, right upper quadrant 12/16/2010   Abnormal levels of other serum enzymes 12/16/2010   Eosinophil count raised 12/23/2009   HEMATOCHEZIA 12/23/2009   Type 2 diabetes mellitus (HCC) 01/05/2009   Gout 01/05/2009   Hypertensive disorder 01/05/2009   BARRETTS ESOPHAGUS 01/05/2009   DIVERTICULOSIS OF COLON 01/05/2009   History of malignant lymphoma 01/05/2009    Orientation RESPIRATION BLADDER Height & Weight     Self, Time, Situation, Place  Normal Incontinent Weight: 145 lb (65.8 kg) Height:  6\' 1"  (185.4 cm)  BEHAVIORAL SYMPTOMS/MOOD NEUROLOGICAL BOWEL NUTRITION STATUS  Incontinent Diet (see dc summary)  AMBULATORY STATUS COMMUNICATION OF NEEDS Skin   Total Care Verbally Normal                       Personal Care Assistance Level of Assistance  Bathing,  Feeding, Dressing Bathing Assistance: Maximum assistance Feeding assistance: Limited assistance Dressing Assistance: Maximum assistance     Functional Limitations Info  Sight, Hearing, Speech Sight Info: Adequate Hearing Info: Adequate Speech Info: Impaired    SPECIAL CARE FACTORS FREQUENCY  PT (By licensed PT), OT (By licensed OT)     PT Frequency: 5x week OT Frequency: 3x week            Contractures Contractures Info: Not present    Additional Factors Info  Code Status, Allergies Code Status Info: DNR Allergies Info: NKA           Current Medications (10/20/2023):  This is the current hospital active medication list Current Facility-Administered Medications  Medication Dose Route Frequency Provider Last Rate Last Admin   acetaminophen (TYLENOL) tablet 650 mg  650 mg Oral Q6H PRN Emokpae, Courage, MD       Or   acetaminophen (TYLENOL) suppository 650 mg  650 mg Rectal Q6H PRN Emokpae, Courage, MD       albuterol (PROVENTIL) (2.5 MG/3ML) 0.083% nebulizer solution 2.5 mg  2.5 mg Nebulization Q2H PRN Emokpae, Courage, MD       amLODipine (NORVASC) tablet 10 mg  10 mg Oral Daily Emokpae, Courage, MD   10 mg at 10/20/23 0845   baclofen (LIORESAL) tablet 5 mg  5 mg Oral TID Shon Hale, MD   5 mg at 10/20/23 0845   bisacodyl (DULCOLAX) suppository 10 mg  10 mg Rectal Daily PRN Shon Hale, MD       buPROPion ER (WELLBUTRIN SR) 12 hr tablet 100 mg  100 mg Oral BID Emokpae, Courage, MD   100 mg at 10/20/23 0845   cinacalcet (SENSIPAR) tablet 30 mg  30 mg Oral Q breakfast Emokpae, Courage, MD   30 mg at 10/20/23 0845   cyanocobalamin (VITAMIN B12) tablet 1,000 mcg  1,000 mcg Oral Daily Emokpae, Courage, MD   1,000 mcg at 10/20/23 0845   dextromethorphan-guaiFENesin (MUCINEX DM) 30-600 MG per 12 hr tablet 1 tablet  1 tablet Oral BID Shon Hale, MD   1 tablet at 10/20/23 0845   gabapentin (NEURONTIN) capsule 300 mg  300 mg Oral BID Emokpae, Courage, MD   300 mg at  10/20/23 0845   insulin aspart (novoLOG) injection 0-6 Units  0-6 Units Subcutaneous TID WC Nevin Bloodgood A, MD   3 Units at 10/20/23 0844   insulin glargine-yfgn (SEMGLEE) injection 8 Units  8 Units Subcutaneous QHS Emokpae, Courage, MD   8 Units at 10/19/23 2144   iron polysaccharides (NIFEREX) capsule 150 mg  150 mg Oral Daily Emokpae, Courage, MD   150 mg at 10/20/23 0845   levofloxacin (LEVAQUIN) tablet 500 mg  500 mg Oral Daily Shahmehdi, Seyed A, MD   500 mg at 10/20/23 1015   metoprolol tartrate (LOPRESSOR) tablet 25 mg  25 mg Oral BID Emokpae, Courage, MD   25 mg at 10/20/23 0845   ondansetron (ZOFRAN) tablet 4 mg  4 mg Oral Q6H PRN Emokpae, Courage, MD       Or   ondansetron (ZOFRAN) injection 4 mg  4 mg Intravenous Q6H PRN Mariea Clonts, Courage, MD   4 mg at 10/17/23 2213   oxyCODONE-acetaminophen (PERCOCET)  7.5-325 MG per tablet 1 tablet  1 tablet Oral Q4H PRN Shon Hale, MD   1 tablet at 10/19/23 1646   pantoprazole (PROTONIX) EC tablet 40 mg  40 mg Oral BID Shahmehdi, Seyed A, MD       polyethylene glycol (MIRALAX / GLYCOLAX) packet 17 g  17 g Oral BID Mariea Clonts, Courage, MD   17 g at 10/20/23 0845   senna-docusate (Senokot-S) tablet 2 tablet  2 tablet Oral QHS Emokpae, Courage, MD   2 tablet at 10/19/23 2145   simvastatin (ZOCOR) tablet 20 mg  20 mg Oral q1800 Emokpae, Courage, MD   20 mg at 10/19/23 1753   sodium chloride flush (NS) 0.9 % injection 3 mL  3 mL Intravenous Q12H Emokpae, Courage, MD   3 mL at 10/20/23 0846   sodium chloride flush (NS) 0.9 % injection 3 mL  3 mL Intravenous Q12H Emokpae, Courage, MD   3 mL at 10/19/23 2146   sodium chloride flush (NS) 0.9 % injection 3 mL  3 mL Intravenous PRN Emokpae, Courage, MD       tamsulosin (FLOMAX) capsule 0.4 mg  0.4 mg Oral Daily Emokpae, Courage, MD   0.4 mg at 10/20/23 0845   traZODone (DESYREL) tablet 100 mg  100 mg Oral QHS Emokpae, Courage, MD   100 mg at 10/19/23 2144     Discharge Medications: Please see  discharge summary for a list of discharge medications.  Relevant Imaging Results:  Relevant Lab Results:   Additional Information    Elliot Gault, LCSW

## 2023-10-20 NOTE — Telephone Encounter (Signed)
Patient seen during hospitalization for evaluation of nausea, vomiting, and hematemesis as well as right lower quadrant abdominal pain.  Patient not physically examined today.  Chart reviewed given he is discharging back to facility today.  Stable at 8.3 from 8.5 yesterday.  He has received 2 units of PRBCs this admission for drop in hemoglobin from 10-6.9.  He underwent EGD 10/19/2023: -LA grade B reflux esophagitis without bleeding -Gastritis s/p biopsy -Duodenal polyp 2 x 3 cm s/p biopsy without bleeding  Recommended daily PPI and to resume diet.  Patient should also continue daily iron therapy.  He needs to continue good bowel regimen with Motegrity and MiraLAX twice daily as this is likely cause of his right lower quadrant abdominal pain.  He is in need of outpatient plan of care consult per recent visit with oncology.   As needed updated colonoscopy for quite some time however in light of small cell lung cancer as well as MGUS with concerns for multiple myeloma, goals of care discussions should be ongoing and given prior stroke he is not a good candidate for colonoscopy.    Angelica Chessman -please arrange hospital follow-up for patient in 3-4 weeks with Verlon Au or Dr. Jena Gauss who he has seen in the past.  Brooke Bonito, MSN, APRN, FNP-BC, AGACNP-BC [facility] Gastroenterology at [facility]

## 2023-10-21 DIAGNOSIS — M6281 Muscle weakness (generalized): Secondary | ICD-10-CM | POA: Diagnosis not present

## 2023-10-21 DIAGNOSIS — R279 Unspecified lack of coordination: Secondary | ICD-10-CM | POA: Diagnosis not present

## 2023-10-21 DIAGNOSIS — R269 Unspecified abnormalities of gait and mobility: Secondary | ICD-10-CM | POA: Diagnosis not present

## 2023-10-21 DIAGNOSIS — K2270 Barrett's esophagus without dysplasia: Secondary | ICD-10-CM | POA: Diagnosis not present

## 2023-10-21 NOTE — Anesthesia Postprocedure Evaluation (Signed)
Anesthesia Post Note  Patient: [name]  Procedure(s) Performed: ESOPHAGOGASTRODUODENOSCOPY (EGD) WITH PROPOFOL BIOPSY  Patient location during evaluation: Phase II Anesthesia Type: General Level of consciousness: awake Pain management: pain level controlled Vital Signs Assessment: post-procedure vital signs reviewed and stable Respiratory status: spontaneous breathing and respiratory function stable Cardiovascular status: blood pressure returned to baseline and stable Postop Assessment: no headache and no apparent nausea or vomiting Anesthetic complications: no Comments: Late entry   No notable events documented.   Last Vitals:  Vitals:   10/20/23 1317 10/20/23 1400  BP: (!) 170/102 (!) 155/98  Pulse: 88 88  Resp: 18 17  Temp: 36.9 C   SpO2: 99% 97%    Last Pain:  Vitals:   10/20/23 1510  TempSrc:   PainSc: 3                  Windell Norfolk

## 2023-10-23 DIAGNOSIS — M6281 Muscle weakness (generalized): Secondary | ICD-10-CM | POA: Diagnosis not present

## 2023-10-23 DIAGNOSIS — K922 Gastrointestinal hemorrhage, unspecified: Secondary | ICD-10-CM | POA: Diagnosis not present

## 2023-10-23 DIAGNOSIS — K2270 Barrett's esophagus without dysplasia: Secondary | ICD-10-CM | POA: Diagnosis not present

## 2023-10-23 DIAGNOSIS — R279 Unspecified lack of coordination: Secondary | ICD-10-CM | POA: Diagnosis not present

## 2023-10-23 DIAGNOSIS — R269 Unspecified abnormalities of gait and mobility: Secondary | ICD-10-CM | POA: Diagnosis not present

## 2023-10-23 DIAGNOSIS — K2100 Gastro-esophageal reflux disease with esophagitis, without bleeding: Secondary | ICD-10-CM | POA: Diagnosis not present

## 2023-10-24 DIAGNOSIS — R269 Unspecified abnormalities of gait and mobility: Secondary | ICD-10-CM | POA: Diagnosis not present

## 2023-10-24 DIAGNOSIS — M6281 Muscle weakness (generalized): Secondary | ICD-10-CM | POA: Diagnosis not present

## 2023-10-24 DIAGNOSIS — K2270 Barrett's esophagus without dysplasia: Secondary | ICD-10-CM | POA: Diagnosis not present

## 2023-10-24 DIAGNOSIS — R279 Unspecified lack of coordination: Secondary | ICD-10-CM | POA: Diagnosis not present

## 2023-10-24 NOTE — Progress Notes (Signed)
I reviewed the pathology results. [name], can you send her a letter with the findings as described below please?  Thanks,  Vista Lawman, MD Gastroenterology and Hepatology [facility] Gastroenterology  ---------------------------------------------------------------------------------------------  [facility] [address], [address], [place], [place] [address] Phone:  [phone]   10/24/23 [place], [place]   Dear [name],  I am writing to inform you that the biopsies taken during your recent endoscopic examination showed:  Duodenal adenoma which is a polyp and considered pre-cancerous , this was also known from previous endoscopy and was not removed given the size and location needing advance expertise . Please continue to follow up in GI clinic   Please call us at [phone] if you have persistent problems or have questions about your condition that have not been fully answered at this time.  Sincerely,  Vista Lawman, MD Gastroenterology and Hepatology

## 2023-10-25 DIAGNOSIS — K922 Gastrointestinal hemorrhage, unspecified: Secondary | ICD-10-CM | POA: Diagnosis not present

## 2023-10-25 DIAGNOSIS — K2100 Gastro-esophageal reflux disease with esophagitis, without bleeding: Secondary | ICD-10-CM | POA: Diagnosis not present

## 2023-10-25 LAB — SURGICAL PATHOLOGY

## 2023-10-26 ENCOUNTER — Encounter (INDEPENDENT_AMBULATORY_CARE_PROVIDER_SITE_OTHER): Payer: Self-pay | Admitting: *Deleted

## 2023-10-26 DIAGNOSIS — I10 Essential (primary) hypertension: Secondary | ICD-10-CM | POA: Diagnosis not present

## 2023-10-26 DIAGNOSIS — D472 Monoclonal gammopathy: Secondary | ICD-10-CM | POA: Diagnosis not present

## 2023-10-26 DIAGNOSIS — K2101 Gastro-esophageal reflux disease with esophagitis, with bleeding: Secondary | ICD-10-CM | POA: Diagnosis not present

## 2023-10-26 DIAGNOSIS — C3490 Malignant neoplasm of unspecified part of unspecified bronchus or lung: Secondary | ICD-10-CM | POA: Diagnosis not present

## 2023-10-26 DIAGNOSIS — M62838 Other muscle spasm: Secondary | ICD-10-CM | POA: Diagnosis not present

## 2023-10-26 DIAGNOSIS — I2510 Atherosclerotic heart disease of native coronary artery without angina pectoris: Secondary | ICD-10-CM | POA: Diagnosis not present

## 2023-10-26 DIAGNOSIS — M1A9XX0 Chronic gout, unspecified, without tophus (tophi): Secondary | ICD-10-CM | POA: Diagnosis not present

## 2023-10-27 DIAGNOSIS — R799 Abnormal finding of blood chemistry, unspecified: Secondary | ICD-10-CM | POA: Diagnosis not present

## 2023-10-27 DIAGNOSIS — D474 Osteomyelofibrosis: Secondary | ICD-10-CM | POA: Diagnosis not present

## 2023-10-27 NOTE — Consult Note (Signed)
[facility] Liaison Note  10/27/2023  [name] 1946-06-13 [id number]  Location: RN Hospital Liaison screened the patient remotely at [facility].  Insurance: [employer]   [name] is a 77 y.o. male who is a Primary Care Patient of Simpson-Tarokh, Leann, DO. The patient was screened for readmission hospitalization with noted high risk score for unplanned readmission risk with 1 IP in 6 months.  The patient was assessed for potential Care Management service needs for post hospital transition for care coordination. Review of patient's electronic medical record reveals patient was admitted with GI hemorrhage with hematemesis. Pt discharged back to his facility [name] 10/20/23. No anticipated needs via VBCI as the facility will continue to address his ongoing needs.    VBCI Care Management/Population Health does not replace or interfere with any arrangements made by the Inpatient Transition of Care team.   For questions contact:   Elliot Cousin, RN, [facility] Liaison [place]   [facility], Population Health Office Hours MTWF  8:00 am-6:00 pm Direct Dial: [phone] mobile [phone] [Office toll free line] Office Hours are M-F 8:30 - 5 pm [name]

## 2023-10-28 DIAGNOSIS — Z13228 Encounter for screening for other metabolic disorders: Secondary | ICD-10-CM | POA: Diagnosis not present

## 2023-10-28 DIAGNOSIS — D649 Anemia, unspecified: Secondary | ICD-10-CM | POA: Diagnosis not present

## 2023-11-02 DIAGNOSIS — M7989 Other specified soft tissue disorders: Secondary | ICD-10-CM | POA: Diagnosis not present

## 2023-11-06 ENCOUNTER — Ambulatory Visit: Payer: Medicare Other | Admitting: Gastroenterology

## 2023-11-10 DIAGNOSIS — G894 Chronic pain syndrome: Secondary | ICD-10-CM | POA: Diagnosis not present

## 2023-11-10 DIAGNOSIS — M1A9XX0 Chronic gout, unspecified, without tophus (tophi): Secondary | ICD-10-CM | POA: Diagnosis not present

## 2023-11-16 DIAGNOSIS — E119 Type 2 diabetes mellitus without complications: Secondary | ICD-10-CM | POA: Diagnosis not present

## 2023-11-20 DIAGNOSIS — M1A9XX0 Chronic gout, unspecified, without tophus (tophi): Secondary | ICD-10-CM | POA: Diagnosis not present

## 2023-11-20 DIAGNOSIS — N189 Chronic kidney disease, unspecified: Secondary | ICD-10-CM | POA: Diagnosis not present

## 2023-11-20 DIAGNOSIS — K2101 Gastro-esophageal reflux disease with esophagitis, with bleeding: Secondary | ICD-10-CM | POA: Diagnosis not present

## 2023-11-21 ENCOUNTER — Ambulatory Visit: Payer: Medicare Other | Admitting: Internal Medicine

## 2023-11-22 DIAGNOSIS — G4700 Insomnia, unspecified: Secondary | ICD-10-CM | POA: Diagnosis not present

## 2023-11-23 DIAGNOSIS — R109 Unspecified abdominal pain: Secondary | ICD-10-CM | POA: Diagnosis not present

## 2023-11-24 DIAGNOSIS — I10 Essential (primary) hypertension: Secondary | ICD-10-CM | POA: Diagnosis not present

## 2023-11-24 DIAGNOSIS — E782 Mixed hyperlipidemia: Secondary | ICD-10-CM | POA: Diagnosis not present

## 2023-11-24 DIAGNOSIS — R627 Adult failure to thrive: Secondary | ICD-10-CM | POA: Diagnosis not present

## 2023-11-24 DIAGNOSIS — D509 Iron deficiency anemia, unspecified: Secondary | ICD-10-CM | POA: Diagnosis not present

## 2023-11-24 DIAGNOSIS — R109 Unspecified abdominal pain: Secondary | ICD-10-CM | POA: Diagnosis not present

## 2023-11-25 DIAGNOSIS — N390 Urinary tract infection, site not specified: Secondary | ICD-10-CM | POA: Diagnosis not present

## 2023-11-25 DIAGNOSIS — I10 Essential (primary) hypertension: Secondary | ICD-10-CM | POA: Diagnosis not present

## 2023-12-01 ENCOUNTER — Ambulatory Visit: Payer: Medicare Other | Admitting: Internal Medicine

## 2023-12-04 DIAGNOSIS — R109 Unspecified abdominal pain: Secondary | ICD-10-CM | POA: Diagnosis not present

## 2023-12-07 DIAGNOSIS — R109 Unspecified abdominal pain: Secondary | ICD-10-CM | POA: Diagnosis not present

## 2023-12-08 DIAGNOSIS — R7989 Other specified abnormal findings of blood chemistry: Secondary | ICD-10-CM | POA: Diagnosis not present

## 2023-12-08 DIAGNOSIS — R112 Nausea with vomiting, unspecified: Secondary | ICD-10-CM | POA: Diagnosis not present

## 2023-12-08 DIAGNOSIS — D709 Neutropenia, unspecified: Secondary | ICD-10-CM | POA: Diagnosis not present

## 2023-12-10 DIAGNOSIS — R112 Nausea with vomiting, unspecified: Secondary | ICD-10-CM | POA: Diagnosis not present

## 2023-12-11 DIAGNOSIS — R112 Nausea with vomiting, unspecified: Secondary | ICD-10-CM | POA: Diagnosis not present

## 2023-12-15 DIAGNOSIS — R112 Nausea with vomiting, unspecified: Secondary | ICD-10-CM | POA: Diagnosis not present

## 2023-12-15 DIAGNOSIS — R197 Diarrhea, unspecified: Secondary | ICD-10-CM | POA: Diagnosis not present

## 2023-12-15 DIAGNOSIS — R5381 Other malaise: Secondary | ICD-10-CM | POA: Diagnosis not present

## 2023-12-16 ENCOUNTER — Other Ambulatory Visit: Payer: Self-pay

## 2023-12-16 ENCOUNTER — Emergency Department (HOSPITAL_COMMUNITY): Payer: Medicare Other

## 2023-12-16 ENCOUNTER — Encounter (HOSPITAL_COMMUNITY): Payer: Self-pay | Admitting: Emergency Medicine

## 2023-12-16 ENCOUNTER — Inpatient Hospital Stay (HOSPITAL_COMMUNITY)
Admission: EM | Admit: 2023-12-16 | Discharge: 2023-12-22 | DRG: 641 | Disposition: A | Discharge status: Hospice | Payer: Medicare Other | Source: Skilled Nursing Facility | Attending: Family Medicine | Admitting: Family Medicine

## 2023-12-16 DIAGNOSIS — R10811 Right upper quadrant abdominal tenderness: Secondary | ICD-10-CM | POA: Diagnosis not present

## 2023-12-16 DIAGNOSIS — N184 Chronic kidney disease, stage 4 (severe): Secondary | ICD-10-CM | POA: Diagnosis present

## 2023-12-16 DIAGNOSIS — R5381 Other malaise: Secondary | ICD-10-CM | POA: Diagnosis present

## 2023-12-16 DIAGNOSIS — I129 Hypertensive chronic kidney disease with stage 1 through stage 4 chronic kidney disease, or unspecified chronic kidney disease: Secondary | ICD-10-CM | POA: Diagnosis present

## 2023-12-16 DIAGNOSIS — R600 Localized edema: Secondary | ICD-10-CM | POA: Diagnosis not present

## 2023-12-16 DIAGNOSIS — Z7189 Other specified counseling: Secondary | ICD-10-CM | POA: Diagnosis not present

## 2023-12-16 DIAGNOSIS — Q399 Congenital malformation of esophagus, unspecified: Secondary | ICD-10-CM | POA: Diagnosis not present

## 2023-12-16 DIAGNOSIS — R1310 Dysphagia, unspecified: Secondary | ICD-10-CM | POA: Diagnosis not present

## 2023-12-16 DIAGNOSIS — Z8719 Personal history of other diseases of the digestive system: Secondary | ICD-10-CM | POA: Diagnosis not present

## 2023-12-16 DIAGNOSIS — Z85818 Personal history of malignant neoplasm of other sites of lip, oral cavity, and pharynx: Secondary | ICD-10-CM

## 2023-12-16 DIAGNOSIS — E870 Hyperosmolality and hypernatremia: Secondary | ICD-10-CM | POA: Diagnosis present

## 2023-12-16 DIAGNOSIS — M109 Gout, unspecified: Secondary | ICD-10-CM | POA: Diagnosis present

## 2023-12-16 DIAGNOSIS — R918 Other nonspecific abnormal finding of lung field: Secondary | ICD-10-CM | POA: Diagnosis not present

## 2023-12-16 DIAGNOSIS — I82502 Chronic embolism and thrombosis of unspecified deep veins of left lower extremity: Secondary | ICD-10-CM | POA: Diagnosis present

## 2023-12-16 DIAGNOSIS — E876 Hypokalemia: Secondary | ICD-10-CM | POA: Diagnosis present

## 2023-12-16 DIAGNOSIS — Z79891 Long term (current) use of opiate analgesic: Secondary | ICD-10-CM

## 2023-12-16 DIAGNOSIS — K3184 Gastroparesis: Secondary | ICD-10-CM | POA: Diagnosis present

## 2023-12-16 DIAGNOSIS — E1143 Type 2 diabetes mellitus with diabetic autonomic (poly)neuropathy: Secondary | ICD-10-CM | POA: Diagnosis present

## 2023-12-16 DIAGNOSIS — Z515 Encounter for palliative care: Secondary | ICD-10-CM | POA: Diagnosis not present

## 2023-12-16 DIAGNOSIS — N179 Acute kidney failure, unspecified: Secondary | ICD-10-CM | POA: Diagnosis not present

## 2023-12-16 DIAGNOSIS — Z87891 Personal history of nicotine dependence: Secondary | ICD-10-CM

## 2023-12-16 DIAGNOSIS — R933 Abnormal findings on diagnostic imaging of other parts of digestive tract: Secondary | ICD-10-CM | POA: Diagnosis not present

## 2023-12-16 DIAGNOSIS — E119 Type 2 diabetes mellitus without complications: Secondary | ICD-10-CM

## 2023-12-16 DIAGNOSIS — K2270 Barrett's esophagus without dysplasia: Secondary | ICD-10-CM | POA: Diagnosis present

## 2023-12-16 DIAGNOSIS — I5A Non-ischemic myocardial injury (non-traumatic): Secondary | ICD-10-CM | POA: Diagnosis present

## 2023-12-16 DIAGNOSIS — Z7401 Bed confinement status: Secondary | ICD-10-CM

## 2023-12-16 DIAGNOSIS — J811 Chronic pulmonary edema: Secondary | ICD-10-CM | POA: Diagnosis not present

## 2023-12-16 DIAGNOSIS — E860 Dehydration: Principal | ICD-10-CM

## 2023-12-16 DIAGNOSIS — E878 Other disorders of electrolyte and fluid balance, not elsewhere classified: Secondary | ICD-10-CM | POA: Diagnosis not present

## 2023-12-16 DIAGNOSIS — Z794 Long term (current) use of insulin: Secondary | ICD-10-CM | POA: Diagnosis not present

## 2023-12-16 DIAGNOSIS — D132 Benign neoplasm of duodenum: Secondary | ICD-10-CM | POA: Diagnosis present

## 2023-12-16 DIAGNOSIS — R112 Nausea with vomiting, unspecified: Secondary | ICD-10-CM | POA: Diagnosis not present

## 2023-12-16 DIAGNOSIS — R935 Abnormal findings on diagnostic imaging of other abdominal regions, including retroperitoneum: Secondary | ICD-10-CM | POA: Diagnosis not present

## 2023-12-16 DIAGNOSIS — R638 Other symptoms and signs concerning food and fluid intake: Secondary | ICD-10-CM | POA: Diagnosis not present

## 2023-12-16 DIAGNOSIS — K5909 Other constipation: Secondary | ICD-10-CM | POA: Diagnosis present

## 2023-12-16 DIAGNOSIS — K7581 Nonalcoholic steatohepatitis (NASH): Secondary | ICD-10-CM | POA: Diagnosis present

## 2023-12-16 DIAGNOSIS — D472 Monoclonal gammopathy: Secondary | ICD-10-CM | POA: Diagnosis present

## 2023-12-16 DIAGNOSIS — Z8572 Personal history of non-Hodgkin lymphomas: Secondary | ICD-10-CM

## 2023-12-16 DIAGNOSIS — I69351 Hemiplegia and hemiparesis following cerebral infarction affecting right dominant side: Secondary | ICD-10-CM

## 2023-12-16 DIAGNOSIS — E785 Hyperlipidemia, unspecified: Secondary | ICD-10-CM | POA: Diagnosis present

## 2023-12-16 DIAGNOSIS — R1110 Vomiting, unspecified: Secondary | ICD-10-CM

## 2023-12-16 DIAGNOSIS — C3490 Malignant neoplasm of unspecified part of unspecified bronchus or lung: Secondary | ICD-10-CM | POA: Diagnosis present

## 2023-12-16 DIAGNOSIS — R109 Unspecified abdominal pain: Secondary | ICD-10-CM | POA: Diagnosis not present

## 2023-12-16 DIAGNOSIS — I499 Cardiac arrhythmia, unspecified: Secondary | ICD-10-CM | POA: Diagnosis not present

## 2023-12-16 DIAGNOSIS — K2990 Gastroduodenitis, unspecified, without bleeding: Secondary | ICD-10-CM | POA: Diagnosis present

## 2023-12-16 DIAGNOSIS — Z79899 Other long term (current) drug therapy: Secondary | ICD-10-CM

## 2023-12-16 DIAGNOSIS — K2970 Gastritis, unspecified, without bleeding: Secondary | ICD-10-CM | POA: Diagnosis present

## 2023-12-16 DIAGNOSIS — N289 Disorder of kidney and ureter, unspecified: Secondary | ICD-10-CM | POA: Diagnosis not present

## 2023-12-16 DIAGNOSIS — Z7982 Long term (current) use of aspirin: Secondary | ICD-10-CM

## 2023-12-16 DIAGNOSIS — K449 Diaphragmatic hernia without obstruction or gangrene: Secondary | ICD-10-CM | POA: Diagnosis not present

## 2023-12-16 DIAGNOSIS — R6889 Other general symptoms and signs: Secondary | ICD-10-CM | POA: Diagnosis not present

## 2023-12-16 DIAGNOSIS — D509 Iron deficiency anemia, unspecified: Secondary | ICD-10-CM | POA: Diagnosis not present

## 2023-12-16 DIAGNOSIS — R1319 Other dysphagia: Secondary | ICD-10-CM

## 2023-12-16 DIAGNOSIS — K648 Other hemorrhoids: Secondary | ICD-10-CM | POA: Diagnosis present

## 2023-12-16 DIAGNOSIS — Z8249 Family history of ischemic heart disease and other diseases of the circulatory system: Secondary | ICD-10-CM

## 2023-12-16 DIAGNOSIS — Z66 Do not resuscitate: Secondary | ICD-10-CM | POA: Diagnosis present

## 2023-12-16 DIAGNOSIS — R197 Diarrhea, unspecified: Secondary | ICD-10-CM | POA: Diagnosis not present

## 2023-12-16 DIAGNOSIS — E1122 Type 2 diabetes mellitus with diabetic chronic kidney disease: Secondary | ICD-10-CM | POA: Diagnosis present

## 2023-12-16 DIAGNOSIS — E1140 Type 2 diabetes mellitus with diabetic neuropathy, unspecified: Secondary | ICD-10-CM | POA: Diagnosis present

## 2023-12-16 DIAGNOSIS — G4700 Insomnia, unspecified: Secondary | ICD-10-CM | POA: Diagnosis present

## 2023-12-16 DIAGNOSIS — I82492 Acute embolism and thrombosis of other specified deep vein of left lower extremity: Secondary | ICD-10-CM

## 2023-12-16 DIAGNOSIS — Z860100 Personal history of colon polyps, unspecified: Secondary | ICD-10-CM

## 2023-12-16 DIAGNOSIS — G8929 Other chronic pain: Secondary | ICD-10-CM | POA: Diagnosis present

## 2023-12-16 DIAGNOSIS — N400 Enlarged prostate without lower urinary tract symptoms: Secondary | ICD-10-CM | POA: Diagnosis present

## 2023-12-16 DIAGNOSIS — K5730 Diverticulosis of large intestine without perforation or abscess without bleeding: Secondary | ICD-10-CM | POA: Diagnosis not present

## 2023-12-16 DIAGNOSIS — R10817 Generalized abdominal tenderness: Secondary | ICD-10-CM | POA: Diagnosis not present

## 2023-12-16 DIAGNOSIS — Z860101 Personal history of adenomatous and serrated colon polyps: Secondary | ICD-10-CM | POA: Diagnosis not present

## 2023-12-16 DIAGNOSIS — H409 Unspecified glaucoma: Secondary | ICD-10-CM | POA: Diagnosis present

## 2023-12-16 DIAGNOSIS — K224 Dyskinesia of esophagus: Secondary | ICD-10-CM | POA: Diagnosis not present

## 2023-12-16 DIAGNOSIS — R110 Nausea: Secondary | ICD-10-CM | POA: Diagnosis not present

## 2023-12-16 DIAGNOSIS — Z9049 Acquired absence of other specified parts of digestive tract: Secondary | ICD-10-CM

## 2023-12-16 DIAGNOSIS — R1314 Dysphagia, pharyngoesophageal phase: Secondary | ICD-10-CM | POA: Diagnosis present

## 2023-12-16 DIAGNOSIS — Z923 Personal history of irradiation: Secondary | ICD-10-CM

## 2023-12-16 DIAGNOSIS — R0689 Other abnormalities of breathing: Secondary | ICD-10-CM | POA: Diagnosis not present

## 2023-12-16 DIAGNOSIS — Z743 Need for continuous supervision: Secondary | ICD-10-CM | POA: Diagnosis not present

## 2023-12-16 LAB — CBC WITH DIFFERENTIAL/PLATELET
Abs Immature Granulocytes: 0.06 10*3/uL (ref 0.00–0.07)
Basophils Absolute: 0 10*3/uL (ref 0.0–0.1)
Basophils Relative: 0 %
Eosinophils Absolute: 0 10*3/uL (ref 0.0–0.5)
Eosinophils Relative: 0 %
HCT: 41 % (ref 39.0–52.0)
Hemoglobin: 12.1 g/dL — ABNORMAL LOW (ref 13.0–17.0)
Immature Granulocytes: 0 %
Lymphocytes Relative: 3 %
Lymphs Abs: 0.5 10*3/uL — ABNORMAL LOW (ref 0.7–4.0)
MCH: 21.8 pg — ABNORMAL LOW (ref 26.0–34.0)
MCHC: 29.5 g/dL — ABNORMAL LOW (ref 30.0–36.0)
MCV: 74 fL — ABNORMAL LOW (ref 80.0–100.0)
Monocytes Absolute: 0.5 10*3/uL (ref 0.1–1.0)
Monocytes Relative: 3 %
Neutro Abs: 14.6 10*3/uL — ABNORMAL HIGH (ref 1.7–7.7)
Neutrophils Relative %: 94 %
Platelets: 256 10*3/uL (ref 150–400)
RBC: 5.54 MIL/uL (ref 4.22–5.81)
RDW: 24.1 % — ABNORMAL HIGH (ref 11.5–15.5)
WBC: 15.7 10*3/uL — ABNORMAL HIGH (ref 4.0–10.5)
nRBC: 0.1 % (ref 0.0–0.2)

## 2023-12-16 LAB — COMPREHENSIVE METABOLIC PANEL
ALT: 14 U/L (ref 0–44)
AST: 22 U/L (ref 15–41)
Albumin: 3.1 g/dL — ABNORMAL LOW (ref 3.5–5.0)
Alkaline Phosphatase: 77 U/L (ref 38–126)
Anion gap: 11 (ref 5–15)
BUN: 23 mg/dL (ref 8–23)
CO2: 22 mmol/L (ref 22–32)
Calcium: 8.9 mg/dL (ref 8.9–10.3)
Chloride: 119 mmol/L — ABNORMAL HIGH (ref 98–111)
Creatinine, Ser: 2.44 mg/dL — ABNORMAL HIGH (ref 0.61–1.24)
GFR, Estimated: 27 mL/min — ABNORMAL LOW (ref >60)
Glucose, Bld: 111 mg/dL — ABNORMAL HIGH (ref 70–99)
Potassium: <2 mmol/L — CL (ref 3.5–5.1)
Sodium: 152 mmol/L — ABNORMAL HIGH (ref 135–145)
Total Bilirubin: 0.7 mg/dL (ref 0.0–1.2)
Total Protein: 7.3 g/dL (ref 6.5–8.1)

## 2023-12-16 LAB — URINALYSIS, ROUTINE W REFLEX MICROSCOPIC
Bilirubin Urine: NEGATIVE
Glucose, UA: NEGATIVE mg/dL
Ketones, ur: 5 mg/dL — AB
Leukocytes,Ua: NEGATIVE
Nitrite: NEGATIVE
Protein, ur: >=300 mg/dL — AB
Specific Gravity, Urine: 1.016 (ref 1.005–1.030)
pH: 5 (ref 5.0–8.0)

## 2023-12-16 LAB — BASIC METABOLIC PANEL
Anion gap: 13 (ref 5–15)
BUN: 22 mg/dL (ref 8–23)
CO2: 16 mmol/L — ABNORMAL LOW (ref 22–32)
Calcium: 8.7 mg/dL — ABNORMAL LOW (ref 8.9–10.3)
Chloride: 120 mmol/L — ABNORMAL HIGH (ref 98–111)
Creatinine, Ser: 2.28 mg/dL — ABNORMAL HIGH (ref 0.61–1.24)
GFR, Estimated: 29 mL/min — ABNORMAL LOW (ref >60)
Glucose, Bld: 100 mg/dL — ABNORMAL HIGH (ref 70–99)
Potassium: 3.2 mmol/L — ABNORMAL LOW (ref 3.5–5.1)
Sodium: 149 mmol/L — ABNORMAL HIGH (ref 135–145)

## 2023-12-16 LAB — TROPONIN I (HIGH SENSITIVITY)
Troponin I (High Sensitivity): 123 ng/L (ref <18)
Troponin I (High Sensitivity): 118 ng/L (ref <18)
Troponin I (High Sensitivity): 144 ng/L (ref <18)

## 2023-12-16 LAB — MAGNESIUM: Magnesium: 1.7 mg/dL (ref 1.7–2.4)

## 2023-12-16 LAB — LIPASE, BLOOD: Lipase: 33 U/L (ref 11–51)

## 2023-12-16 LAB — PHOSPHORUS: Phosphorus: 2.3 mg/dL — ABNORMAL LOW (ref 2.5–4.6)

## 2023-12-16 LAB — BRAIN NATRIURETIC PEPTIDE: B Natriuretic Peptide: 135 pg/mL — ABNORMAL HIGH (ref 0.0–100.0)

## 2023-12-16 MED ORDER — POTASSIUM CHLORIDE 10 MEQ/100ML IV SOLN
10.0000 meq | INTRAVENOUS | Status: AC
Start: 2023-12-16 — End: 2023-12-17
  Administered 2023-12-16 – 2023-12-17 (×3): 10 meq via INTRAVENOUS
  Filled 2023-12-16 (×4): qty 100

## 2023-12-16 MED ORDER — POTASSIUM CHLORIDE CRYS ER 20 MEQ PO TBCR: 40.0000 meq | EXTENDED_RELEASE_TABLET | Freq: Once | ORAL | Status: DC

## 2023-12-16 MED ORDER — LORAZEPAM 2 MG/ML IJ SOLN
0.5000 mg | Freq: Four times a day (QID) | INTRAMUSCULAR | Status: DC | PRN
  Administered 2023-12-17 (×2): 0.5 mg via INTRAVENOUS
  Filled 2023-12-16 (×2): qty 1

## 2023-12-16 MED ORDER — LACTATED RINGERS IV BOLUS
1000.0000 mL | Freq: Once | INTRAVENOUS | Status: AC
  Administered 2023-12-16: 1000 mL via INTRAVENOUS

## 2023-12-16 MED ORDER — MELATONIN 3 MG PO TABS
6.0000 mg | ORAL_TABLET | Freq: Every evening | ORAL | Status: DC | PRN
  Administered 2023-12-19 – 2023-12-21 (×2): 6 mg via ORAL
  Filled 2023-12-16 (×2): qty 2

## 2023-12-16 MED ORDER — SUCRALFATE 1 GM/10ML PO SUSP
1.0000 g | Freq: Three times a day (TID) | ORAL | Status: DC
  Administered 2023-12-17 – 2023-12-22 (×19): 1 g via ORAL
  Filled 2023-12-16 (×20): qty 10

## 2023-12-16 MED ORDER — POLYETHYLENE GLYCOL 3350 17 G PO PACK: 17.0000 g | PACK | Freq: Every day | ORAL | Status: DC | PRN

## 2023-12-16 MED ORDER — DEXTROSE 5 % IV SOLN: INTRAVENOUS | Status: AC

## 2023-12-16 MED ORDER — SODIUM CHLORIDE 0.9% FLUSH
3.0000 mL | Freq: Two times a day (BID) | INTRAVENOUS | Status: DC
  Administered 2023-12-16 – 2023-12-20 (×8): 3 mL via INTRAVENOUS

## 2023-12-16 MED ORDER — PANTOPRAZOLE SODIUM 40 MG IV SOLR
40.0000 mg | Freq: Two times a day (BID) | INTRAVENOUS | Status: DC
  Administered 2023-12-16 – 2023-12-21 (×10): 40 mg via INTRAVENOUS
  Filled 2023-12-16 (×11): qty 10

## 2023-12-16 MED ORDER — LACTATED RINGERS IV BOLUS
500.0000 mL | Freq: Once | INTRAVENOUS | Status: AC
  Administered 2023-12-16: 500 mL via INTRAVENOUS

## 2023-12-16 MED ORDER — POTASSIUM CHLORIDE 10 MEQ/100ML IV SOLN
10.0000 meq | INTRAVENOUS | Status: AC
Start: 2023-12-16 — End: 2023-12-17
  Administered 2023-12-16 (×6): 10 meq via INTRAVENOUS
  Filled 2023-12-16 (×5): qty 100

## 2023-12-16 MED ORDER — TECHNETIUM TO 99M ALBUMIN AGGREGATED
4.2000 | Freq: Once | INTRAVENOUS | Status: AC | PRN
  Administered 2023-12-16: 4.2 via INTRAVENOUS

## 2023-12-16 MED ORDER — ACETAMINOPHEN 500 MG PO TABS
1000.0000 mg | ORAL_TABLET | Freq: Four times a day (QID) | ORAL | Status: DC | PRN
  Administered 2023-12-17 – 2023-12-20 (×2): 1000 mg via ORAL
  Filled 2023-12-16 (×2): qty 2

## 2023-12-16 MED ORDER — MAGNESIUM SULFATE 2 GM/50ML IV SOLN
2.0000 g | Freq: Once | INTRAVENOUS | Status: AC
  Administered 2023-12-16: 2 g via INTRAVENOUS
  Filled 2023-12-16: qty 50

## 2023-12-16 MED ORDER — HEPARIN SODIUM (PORCINE) 5000 UNIT/ML IJ SOLN
5000.0000 [IU] | Freq: Three times a day (TID) | INTRAMUSCULAR | Status: DC
  Administered 2023-12-16 – 2023-12-22 (×16): 5000 [IU] via SUBCUTANEOUS
  Filled 2023-12-16 (×17): qty 1

## 2023-12-16 MED ORDER — FENTANYL CITRATE PF 50 MCG/ML IJ SOSY
25.0000 ug | PREFILLED_SYRINGE | Freq: Once | INTRAMUSCULAR | Status: AC
Start: 2023-12-16 — End: 2023-12-16
  Administered 2023-12-16: 25 ug via INTRAVENOUS
  Filled 2023-12-16: qty 1

## 2023-12-16 NOTE — ED Triage Notes (Signed)
 Pt came in ems from [facility] with complaints of abdominal pain 6/10 which is worse than his chronic pain due to untreatable stomach cancer. PT complains of nausea and vomiting that is also worse than his baseline.

## 2023-12-16 NOTE — ED Provider Notes (Signed)
 [place] [facility] AT [facility] Provider Note   CSN: [id number] Arrival date & time: 12/16/23  1217     History  Chief Complaint  Patient presents with   Abdominal Pain   Emesis   Nausea    [name] is a 78 y.o. male. He has PMH of stroke with residual right-sided hemiparesis, hepatic steatosis, renal insufficiency, non-small cell lung cancer, lymphoma, MGUS, adenocarcinoma of the parotid gland, asthma, HTN, DM, Barrett's esophagus, EOE treated with steroids,  GERD, gastroparesis, hemorrhoids, colon polyps, chronic constipation, h/o hemorrhagic colitis .  He is brought into the ER today accompanied by his wife for evaluation of worsened chronic abdominal pain with vomiting x 2 weeks.  He states he has not been able to keep anything down.  He states even with liquids he really vomits them back up.  He has had constant nonradiating left-sided chest pain, has had shortness of breath, does have some pain with respirations.   Patient and his wife note that though he has history of non-small cell lung cancer and MGUS they are no longer monitoring him for the use as he has so many other medical issues they would not plan to treat him.  Abdominal Pain Associated symptoms: vomiting   Emesis Associated symptoms: abdominal pain        Home Medications Prior to Admission medications   Medication Sig Start Date End Date Taking? Authorizing Provider  acetaminophen  (TYLENOL ) 650 MG CR tablet Take 650 mg by mouth every 4 (four) hours as needed for pain.   Yes [provider]  allopurinol  (ZYLOPRIM ) 300 MG tablet TAKE 1 TABLET(300 MG) BY MOUTH DAILY Patient taking differently: Take 300 mg by mouth daily. TAKE 1 TABLET(300 MG) BY MOUTH DAILY 12/10/21  Yes Duanne Butler DASEN, MD  amLODipine  (NORVASC ) 10 MG tablet Take 1 tablet (10 mg total) by mouth daily. 12/10/21  Yes Duanne Butler DASEN, MD  ASPIRIN  ADULT LOW STRENGTH 81 MG tablet Take 1 tablet (81 mg total)  by mouth daily. Restart in 1 week 10/23/23  Yes Shahmehdi, Seyed A, MD  b complex-vitamin c-folic acid (NEPHRO-VITE) 0.8 MG TABS tablet Take 1 tablet by mouth at bedtime.   Yes [provider]  Baclofen  5 MG TABS Take 1 tablet by mouth 3 (three) times daily. 08/22/22  Yes [provider]  bisacodyl  (DULCOLAX) 10 MG suppository Place 10 mg rectally daily as needed for moderate constipation.   Yes [provider]  buPROPion  ER (WELLBUTRIN  SR) 100 MG 12 hr tablet Take 100 mg by mouth 2 (two) times daily. 02/03/22  Yes [provider]  cinacalcet  (SENSIPAR ) 30 MG tablet Take 30 mg by mouth daily with breakfast. 02/03/22  Yes [provider]  cyanocobalamin  (VITAMIN B12) 1000 MCG tablet Take 1 tablet (1,000 mcg total) by mouth daily. 07/17/22  Yes Antoinette Doe, MD  doxycycline  (VIBRA -TABS) 100 MG tablet Take 100 mg by mouth 2 (two) times daily. 7 day course starting on 12/11/2023 12/11/23  Yes [provider]  furosemide  (LASIX ) 20 MG tablet Take 1 tablet (20 mg total) by mouth daily. 10/23/23  Yes Shahmehdi, Seyed A, MD  gabapentin  (NEURONTIN ) 600 MG tablet Take 600 mg by mouth 2 (two) times daily. 11/16/23  Yes [provider]  insulin  glargine (LANTUS  SOLOSTAR) 100 UNIT/ML Solostar Pen Inject 30 Units into the skin at bedtime. 03/04/21  Yes Reardon, Benton PARAS, NP  ipratropium-albuterol  (DUONEB) 0.5-2.5 (3) MG/3ML SOLN Take 3 mLs by nebulization every 6 (  six) hours.   Yes [provider]  iron  polysaccharides (NIFEREX) 150 MG capsule TAKE 1 CAPSULE(150 MG) BY MOUTH DAILY Patient taking differently: Take 150 mg by mouth daily. 07/07/21  Yes Duanne Butler DASEN, MD  metoprolol  tartrate (LOPRESSOR ) 50 MG tablet Take 50 mg by mouth daily. 12/10/21  Yes [provider]  ondansetron  (ZOFRAN -ODT) 4 MG disintegrating tablet Take 4 mg by mouth every 6 (six) hours as needed for nausea or vomiting.   Yes [provider]   oxyCODONE -acetaminophen  (PERCOCET) 7.5-325 MG tablet Take 1 tablet by mouth in the morning and at bedtime.   Yes [provider]  pantoprazole  (PROTONIX ) 40 MG tablet Take 1 tablet (40 mg total) by mouth 2 (two) times daily. 10/20/23  Yes Shahmehdi, Seyed A, MD  polyethylene glycol powder (MIRALAX ) 17 GM/SCOOP powder Take one capful daily. Hold for diarrhea. Patient taking differently: Take 17 g by mouth daily. Hold for diarrhea. 08/24/22  Yes Ezzard Sonny RAMAN, PA-C  Potassium Chloride  ER 20 MEQ TBCR Take 1 tablet by mouth 2 (two) times daily. 12/08/23  Yes [provider]  Prucalopride Succinate  (MOTEGRITY ) 2 MG TABS Take 1 tablet (2 mg total) by mouth daily. 11/22/22  Yes Rourk, Lamar HERO, MD  rosuvastatin  (CRESTOR ) 20 MG tablet Take 20 mg by mouth daily.   Yes [provider]  tamsulosin  (FLOMAX ) 0.4 MG CAPS capsule TAKE 1 CAPSULE(0.4 MG) BY MOUTH DAILY Patient taking differently: Take 0.4 mg by mouth daily. 12/10/21  Yes Duanne Butler DASEN, MD  traZODone  (DESYREL ) 100 MG tablet Take 100 mg by mouth at bedtime. 03/07/22  Yes [provider]  insulin  lispro (HUMALOG  KWIKPEN) 100 UNIT/ML KwikPen Per sliding scale CBG 70 - 120:   0 units CBG 121 -150: 1 units  CBG 151 - 200: 2 unit  CBG 201-250:   4 units  CBG 251-300:   6 units  CBG 301-350:  8 units  CBG 351-400: 10 units  > 450 call MD Patient not taking: Reported on 12/16/2023 10/20/23   Willette Adriana LABOR, MD      Allergies    Patient has no known allergies.    Review of Systems   Review of Systems  Gastrointestinal:  Positive for abdominal pain and vomiting.    Physical Exam Updated Vital Signs BP (!) 158/108 (BP Location: Right Arm)   Pulse (!) 116   Temp 97.9 F (36.6 C) (Oral)   Resp 14   SpO2 99%  Physical Exam Vitals and nursing note reviewed.  Constitutional:      General: He is not in acute distress.    Appearance: He is well-developed.  HENT:     Head: Normocephalic and  atraumatic.  Eyes:     Extraocular Movements: Extraocular movements intact.     Conjunctiva/sclera: Conjunctivae normal.     Pupils: Pupils are equal, round, and reactive to light.  Cardiovascular:     Rate and Rhythm: Regular rhythm. Tachycardia present.     Heart sounds: No murmur heard. Pulmonary:     Effort: Pulmonary effort is normal. No respiratory distress.     Breath sounds: Normal breath sounds.  Abdominal:     Palpations: Abdomen is soft.     Tenderness: There is no abdominal tenderness.  Genitourinary:    Rectum: Normal. Guaiac result negative.     Comments: Exam chaperoned by ED tech Musculoskeletal:        General: No swelling.     Cervical back: Neck supple.  Right lower leg: 4+ Pitting Edema present.     Left lower leg: 2+ Edema present.  Skin:    General: Skin is warm and dry.     Capillary Refill: Capillary refill takes less than 2 seconds.  Neurological:     General: No focal deficit present.     Mental Status: He is alert.     Comments: Chronic right sided hemiparesis  Psychiatric:        Mood and Affect: Mood normal.     ED Results / Procedures / Treatments   Labs (all labs ordered are listed, but only abnormal results are displayed) Labs Reviewed  CBC WITH DIFFERENTIAL/PLATELET - Abnormal; Notable for the following components:      Result Value   WBC 15.7 (*)    Hemoglobin 12.1 (*)    MCV 74.0 (*)    MCH 21.8 (*)    MCHC 29.5 (*)    RDW 24.1 (*)    Neutro Abs 14.6 (*)    Lymphs Abs 0.5 (*)    All other components within normal limits  COMPREHENSIVE METABOLIC PANEL - Abnormal; Notable for the following components:   Sodium 152 (*)    Potassium <2.0 (*)    Chloride 119 (*)    Glucose, Bld 111 (*)    Creatinine, Ser 2.44 (*)    Albumin  3.1 (*)    GFR, Estimated 27 (*)    All other components within normal limits  URINALYSIS, ROUTINE W REFLEX MICROSCOPIC - Abnormal; Notable for the following components:   Hgb urine dipstick SMALL (*)     Ketones, ur 5 (*)    Protein, ur >=300 (*)    Bacteria, UA RARE (*)    All other components within normal limits  BRAIN NATRIURETIC PEPTIDE - Abnormal; Notable for the following components:   B Natriuretic Peptide 135.0 (*)    All other components within normal limits  TROPONIN I (HIGH SENSITIVITY) - Abnormal; Notable for the following components:   Troponin I (High Sensitivity) 123 (*)    All other components within normal limits  TROPONIN I (HIGH SENSITIVITY) - Abnormal; Notable for the following components:   Troponin I (High Sensitivity) 118 (*)    All other components within normal limits  TROPONIN I (HIGH SENSITIVITY) - Abnormal; Notable for the following components:   Troponin I (High Sensitivity) 144 (*)    All other components within normal limits  LIPASE, BLOOD  MAGNESIUM   PATHOLOGIST SMEAR REVIEW  PHOSPHORUS  POC OCCULT BLOOD, ED    EKG EKG Interpretation Date/Time:  Saturday December 16 2023 12:47:08 EST Ventricular Rate:  123 PR Interval:  24 QRS Duration:  72 QT Interval:  323 QTC Calculation: 462 R Axis:   29  Text Interpretation: Sinus tachycardia Consider RVH w/ secondary repol abnormality Abnormal T, consider ischemia, lateral leads ST depression V1-V3, suggest recording posterior leads No significant change since last tracing Confirmed by Randol Simmonds [phone]) on 12/16/2023 12:49:53 PM  Radiology NM Pulmonary Perfusion Result Date: 12/16/2023 CLINICAL DATA:  Difficulty breathing EXAM: NUCLEAR MEDICINE PERFUSION LUNG SCAN TECHNIQUE: Perfusion images were obtained in multiple projections after intravenous injection of radiopharmaceutical. Ventilation scans intentionally deferred if perfusion scan and chest x-ray adequate for interpretation during COVID 19 epidemic. RADIOPHARMACEUTICALS:  4.2 mCi Tc-53m MAA IV COMPARISON:  Chest x-ray from earlier in the same day. FINDINGS: Adequate uptake is noted throughout both lungs. No focal defect to suggest pulmonary embolism is  noted. IMPRESSION: No evidence of pulmonary embolus. Electronically Signed  By: Oneil Devonshire M.D.   On: 12/16/2023 18:18   US  Venous Img Lower Right (DVT Study) Result Date: 12/16/2023 CLINICAL DATA:  Right lower extremity edema.  Evaluate for DVT. EXAM: RIGHT LOWER EXTREMITY VENOUS DOPPLER ULTRASOUND TECHNIQUE: Gray-scale sonography with graded compression, as well as color Doppler and duplex ultrasound were performed to evaluate the lower extremity deep venous systems from the level of the common femoral vein and including the common femoral, femoral, profunda femoral, popliteal and calf veins including the posterior tibial, peroneal and gastrocnemius veins when visible. The superficial great saphenous vein was also interrogated. Spectral Doppler was utilized to evaluate flow at rest and with distal augmentation maneuvers in the common femoral, femoral and popliteal veins. COMPARISON:  Right lower extremity venous Doppler ultrasound-07/27/2018 FINDINGS: Contralateral Common Femoral Vein: There is mixed echogenic near occlusive thrombus within the contralateral left common femoral vein (images 3 through 6). Common Femoral Vein: No evidence of thrombus. Normal compressibility, respiratory phasicity and response to augmentation. Saphenofemoral Junction: No evidence of thrombus. Normal compressibility and flow on color Doppler imaging. Profunda Femoral Vein: No evidence of thrombus. Normal compressibility and flow on color Doppler imaging. Femoral Vein: No evidence of thrombus. Normal compressibility, respiratory phasicity and response to augmentation. Popliteal Vein: No evidence of thrombus. Normal compressibility, respiratory phasicity and response to augmentation. Calf Veins: Appear patent where visualized Superficial Great Saphenous Vein: Appears patent where visualized Other Findings: There is a moderate amount of subcutaneous edema at the level of the right lower leg. IMPRESSION: 1. No definite evidence of  DVT within right lower extremity. 2. Near occlusive thrombus within the contralateral left common femoral vein, presumably chronic in etiology though new compared to the 07/2018 examination. Clinical correlation is advised. Electronically Signed   By: Norleen Roulette M.D.   On: 12/16/2023 16:28   DG Chest Portable 1 View Result Date: 12/16/2023 CLINICAL DATA:  Abdominal pain, edema EXAM: PORTABLE CHEST - 1 VIEW COMPARISON:  CT 05/10/2023, and previous FINDINGS: Left apical bullous change with an area of bandlike consolidation in the upper lobe which is stable from prior study. No new infiltrate or overt edema. Heart size and mediastinal contours are within normal limits. No effusion. Advanced bilateral glenohumeral DJD. IMPRESSION: 1. No acute findings. 2. Left apical bullous change with stable bandlike consolidation. Electronically Signed   By: JONETTA Faes M.D.   On: 12/16/2023 14:56   CT ABDOMEN PELVIS WO CONTRAST Result Date: 12/16/2023 CLINICAL DATA:  Acute abdominal pain, history of gastric carcinoma EXAM: CT ABDOMEN AND PELVIS WITHOUT CONTRAST TECHNIQUE: Multidetector CT imaging of the abdomen and pelvis was performed following the standard protocol without IV contrast. RADIATION DOSE REDUCTION: This exam was performed according to the departmental dose-optimization program which includes automated exposure control, adjustment of the mA and/or kV according to patient size and/or use of iterative reconstruction technique. COMPARISON:  10/17/2023 FINDINGS: Lower chest: No pleural or pericardial effusion. Scattered coronary and aortic calcifications. Small hiatal hernia. Hepatobiliary: No focal liver abnormality is seen. Status post cholecystectomy. No biliary dilatation. Pancreas: Unremarkable. No pancreatic ductal dilatation or surrounding inflammatory changes. Spleen: Normal in size without focal abnormality. Adrenals/Urinary Tract: No adrenal mass. No urolithiasis or hydronephrosis. Multiple cortical lesions  in both kidneys, some of which can be characterized as cysts, largest 4.9 cm 13 HU, left upper pole; no followup recommended. Urinary bladder partially distended. Stomach/Bowel: Small hiatal hernia. Stomach is nondistended, with old gastrostomy tract. Small bowel is decompressed. Normal appendix. The colon is partially distended, with multiple  scattered diverticula. Segmental wall thickening is suspected in the proximal sigmoid colon, without adjacent inflammatory change. Vascular/Lymphatic: Moderate aortoiliac calcified atheromatous plaque without aneurysm. No abdominal or pelvic adenopathy. Reproductive: Prostate enlargement. Other: Right pelvic phleboliths.  No ascites.  No free air. Musculoskeletal: Post cement vertebral augmentation L2. Stable spondylitic changes L3-S1. IMPRESSION: 1. No acute findings. 2. Colonic diverticulosis. 3. Small hiatal hernia. 4. Smooth wall thickening in the proximal sigmoid colon without adjacent inflammatory change, nonspecific. Consider outpatient elective colonoscopic evaluation. 5.  Aortic Atherosclerosis (ICD10-I70.0). Electronically Signed   By: JONETTA Faes M.D.   On: 12/16/2023 14:51    Procedures .Ultrasound ED Peripheral IV (Provider)  Date/Time: 12/16/2023 5:09 PM  Performed by: Suellen Sherran LABOR, PA-C Authorized by: Suellen Sherran LABOR, PA-C   Procedure details:    Indications: hydration, multiple failed IV attempts and poor IV access     Skin Prep: chlorhexidine  gluconate     Location: left upper arm.   Angiocath:  20 G   Bedside Ultrasound Guided: Yes     Images: not archived     Patient tolerated procedure without complications: Yes     Dressing applied: Yes   .Critical Care  Performed by: Suellen Sherran LABOR, PA-C Authorized by: Suellen Sherran LABOR, PA-C   Critical care provider statement:    Critical care time (minutes):  40   Critical care time was exclusive of:  Separately billable procedures and treating other patients   Critical care was  necessary to treat or prevent imminent or life-threatening deterioration of the following conditions:  Cardiac failure and dehydration   Critical care was time spent personally by me on the following activities:  Development of treatment plan with patient or surrogate, discussions with consultants, evaluation of patient's response to treatment, examination of patient, ordering and review of laboratory studies, ordering and review of radiographic studies, ordering and performing treatments and interventions, pulse oximetry, re-evaluation of patient's condition, review of old charts and obtaining history from patient or surrogate   Care discussed with: admitting provider       Medications Ordered in ED Medications  potassium chloride  10 mEq in 100 mL IVPB (10 mEq Intravenous New Bag/Given 12/16/23 1934)  lactated ringers  bolus 500 mL (0 mLs Intravenous Stopped 12/16/23 1759)  magnesium  sulfate IVPB 2 g 50 mL (0 g Intravenous Stopped 12/16/23 1758)  lactated ringers  bolus 1,000 mL (1,000 mLs Intravenous New Bag/Given 12/16/23 1805)  fentaNYL  (SUBLIMAZE ) injection 25 mcg (25 mcg Intravenous Given 12/16/23 1801)  technetium albumin  aggregated (MAA) injection solution 4.2 millicurie (4.2 millicuries Intravenous Contrast Given 12/16/23 1720)  lactated ringers  bolus 500 mL (500 mLs Intravenous New Bag/Given 12/16/23 1945)    ED Course/ Medical Decision Making/ A&P                                 Medical Decision Making This patient presents to the ED for concern of nausea and vomiting x 2 weeks, still having chest pain that has been constant and nonradiating this involves an extensive number of treatment options, and is a complaint that carries with it a high risk of complications and morbidity.  The differential diagnosis includes bowel obstruction, gastritis, colitis, gastroenteritis, electrolyte abnormality, ACS, PE, pneumonia, other   Co morbidities that complicate the patient evaluation :   Small cell lung  cancer MGUS, hypertension, diabetes, CKD   Additional history obtained:  Additional history obtained from EMR External records from outside source  obtained and reviewed including prior notes including oncology notes, radiology studies, labs   Lab Tests:  I Ordered, and personally interpreted labs.  The pertinent results include: Patient has some significant laboratory abnormalities, elevated BUN and troponin, sodium elevated at 152, potassium critically low at less than 2.0, chloride elevated at 119, baseline renal function   Imaging Studies ordered:  I ordered imaging studies including chest x-ray which shows no acute findings; CT abdomen pelvis shows smooth wall thickening in the proximal sigmoid colon without inflammatory change, diverticulosis without diverticulitis, no acute findings I independently visualized and interpreted imaging within scope of identifying emergent findings  I agree with the radiologist interpretation  Ultrasound right lower extremity shows no right lower extremity DVT but does show nearly occlusive DVT in left lower extremity.  VQ scan shows no endings to suggest PE radiologist report   Cardiac Monitoring: / EKG:  The patient was maintained on a cardiac monitor.  I personally viewed and interpreted the cardiac monitored which showed an underlying rhythm of: Sinus tachycardia   Consultations Obtained:  I requested consultation with the hospitalist, Dr. Keturah,  and discussed lab and imaging findings as well as pertinent plan - they recommend: He will admit the patient but will plan to keep him in the ER and recheck BMP, will keep him down here until potassium is above 2 and patient will have to go to stepdown.  Requested additional fluid bolus due to patient's severe dehydration   Problem List / ED Course / Critical interventions / Medication management  Nausea vomiting-patient unable to keep anything down for the past 2 weeks.  He states every time he  eats he throws up immediately, of the same with drinking.  He was opposed to follow-up with GI but unfortunately his nursing home was late getting him there and had a canceled appointment and reschedule.  He was following up for evaluation for possible GI bleeding after recent admission.  Patient tachycardic on exam, ill-appearing.  Critically ill requiring frequent monitoring and continuous cardiac monitoring due to his critically low potassium.  Rectal exam shows negative occult blood, brown stool noted.  Hesitant to start anticoagulation until further evaluation by hospitalist due to recent admission requiring transfusion for GI bleeding. On reevaluation, heart rate improved from 120-1 15 after fluids  Social Determinants of Health:  Patient lives in nursing home       Amount and/or Complexity of Data Reviewed Labs: ordered. Radiology: ordered.  Risk Prescription drug management.           Final Clinical Impression(s) / ED Diagnoses Final diagnoses:  None    Rx / DC Orders ED Discharge Orders     None         Suellen Sherran DELENA DEVONNA 12/16/23 2028    Randol Simmonds, MD 12/17/23 1921

## 2023-12-16 NOTE — ED Notes (Signed)
 Date and time results received: 12/16/23 1932   Test: TROPONIN Critical Value: 144  Name of Provider Notified: Suezanne Jacquet, MD

## 2023-12-17 DIAGNOSIS — E876 Hypokalemia: Secondary | ICD-10-CM | POA: Diagnosis not present

## 2023-12-17 DIAGNOSIS — E860 Dehydration: Secondary | ICD-10-CM

## 2023-12-17 DIAGNOSIS — E878 Other disorders of electrolyte and fluid balance, not elsewhere classified: Secondary | ICD-10-CM | POA: Diagnosis not present

## 2023-12-17 DIAGNOSIS — R1110 Vomiting, unspecified: Secondary | ICD-10-CM | POA: Diagnosis not present

## 2023-12-17 DIAGNOSIS — R197 Diarrhea, unspecified: Secondary | ICD-10-CM | POA: Diagnosis not present

## 2023-12-17 LAB — BASIC METABOLIC PANEL
Anion gap: 8 (ref 5–15)
BUN: 20 mg/dL (ref 8–23)
CO2: 22 mmol/L (ref 22–32)
Calcium: 9 mg/dL (ref 8.9–10.3)
Chloride: 119 mmol/L — ABNORMAL HIGH (ref 98–111)
Creatinine, Ser: 2.1 mg/dL — ABNORMAL HIGH (ref 0.61–1.24)
GFR, Estimated: 32 mL/min — ABNORMAL LOW (ref >60)
Glucose, Bld: 91 mg/dL (ref 70–99)
Potassium: 2.3 mmol/L — CL (ref 3.5–5.1)
Sodium: 149 mmol/L — ABNORMAL HIGH (ref 135–145)

## 2023-12-17 LAB — CBC WITH DIFFERENTIAL/PLATELET
Abs Immature Granulocytes: 0.06 10*3/uL (ref 0.00–0.07)
Basophils Absolute: 0 10*3/uL (ref 0.0–0.1)
Basophils Relative: 0 %
Eosinophils Absolute: 0 10*3/uL (ref 0.0–0.5)
Eosinophils Relative: 0 %
HCT: 34.3 % — ABNORMAL LOW (ref 39.0–52.0)
Hemoglobin: 10.4 g/dL — ABNORMAL LOW (ref 13.0–17.0)
Immature Granulocytes: 0 %
Lymphocytes Relative: 6 %
Lymphs Abs: 0.8 10*3/uL (ref 0.7–4.0)
MCH: 21.9 pg — ABNORMAL LOW (ref 26.0–34.0)
MCHC: 30.3 g/dL (ref 30.0–36.0)
MCV: 72.2 fL — ABNORMAL LOW (ref 80.0–100.0)
Monocytes Absolute: 0.9 10*3/uL (ref 0.1–1.0)
Monocytes Relative: 6 %
Neutro Abs: 12.6 10*3/uL — ABNORMAL HIGH (ref 1.7–7.7)
Neutrophils Relative %: 88 %
Platelets: 215 10*3/uL (ref 150–400)
RBC: 4.75 MIL/uL (ref 4.22–5.81)
RDW: 23.7 % — ABNORMAL HIGH (ref 11.5–15.5)
WBC: 14.3 10*3/uL — ABNORMAL HIGH (ref 4.0–10.5)
nRBC: 0.3 % — ABNORMAL HIGH (ref 0.0–0.2)

## 2023-12-17 LAB — MAGNESIUM: Magnesium: 1.6 mg/dL — ABNORMAL LOW (ref 1.7–2.4)

## 2023-12-17 LAB — GLUCOSE, CAPILLARY
Glucose-Capillary: 74 mg/dL (ref 70–99)
Glucose-Capillary: 80 mg/dL (ref 70–99)
Glucose-Capillary: 108 mg/dL — ABNORMAL HIGH (ref 70–99)
Glucose-Capillary: 109 mg/dL — ABNORMAL HIGH (ref 70–99)

## 2023-12-17 LAB — PHOSPHORUS: Phosphorus: 2 mg/dL — ABNORMAL LOW (ref 2.5–4.6)

## 2023-12-17 LAB — CK: Total CK: 83 U/L (ref 49–397)

## 2023-12-17 LAB — POTASSIUM: Potassium: 2.5 mmol/L — CL (ref 3.5–5.1)

## 2023-12-17 LAB — C DIFFICILE QUICK SCREEN W PCR REFLEX
C Diff antigen: POSITIVE — AB
C Diff toxin: NEGATIVE

## 2023-12-17 LAB — MRSA NEXT GEN BY PCR, NASAL: MRSA by PCR Next Gen: NOT DETECTED

## 2023-12-17 MED ORDER — INSULIN ASPART 100 UNIT/ML IJ SOLN
0.0000 [IU] | Freq: Three times a day (TID) | INTRAMUSCULAR | Status: DC
  Administered 2023-12-20 – 2023-12-22 (×4): 1 [IU] via SUBCUTANEOUS

## 2023-12-17 MED ORDER — ROSUVASTATIN CALCIUM 20 MG PO TABS
20.0000 mg | ORAL_TABLET | Freq: Every evening | ORAL | Status: DC
  Administered 2023-12-17 – 2023-12-21 (×5): 20 mg via ORAL
  Filled 2023-12-17 (×5): qty 1

## 2023-12-17 MED ORDER — OXYCODONE-ACETAMINOPHEN 7.5-325 MG PO TABS
1.0000 | ORAL_TABLET | Freq: Two times a day (BID) | ORAL | Status: DC | PRN
  Administered 2023-12-18 – 2023-12-21 (×3): 1 via ORAL
  Filled 2023-12-17 (×5): qty 1

## 2023-12-17 MED ORDER — DEXTROSE 5 % IV SOLN: INTRAVENOUS | Status: AC

## 2023-12-17 MED ORDER — POTASSIUM CHLORIDE CRYS ER 20 MEQ PO TBCR
40.0000 meq | EXTENDED_RELEASE_TABLET | Freq: Three times a day (TID) | ORAL | Status: DC
  Administered 2023-12-17: 40 meq via ORAL
  Filled 2023-12-17: qty 2

## 2023-12-17 MED ORDER — POTASSIUM & SODIUM PHOSPHATES 280-160-250 MG PO PACK
2.0000 | PACK | ORAL | Status: AC
  Administered 2023-12-17: 2 via ORAL
  Filled 2023-12-17: qty 2

## 2023-12-17 MED ORDER — SODIUM CHLORIDE 0.9% FLUSH
10.0000 mL | Freq: Two times a day (BID) | INTRAVENOUS | Status: DC
  Administered 2023-12-17 – 2023-12-21 (×8): 10 mL

## 2023-12-17 MED ORDER — POTASSIUM CHLORIDE 10 MEQ/50ML IV SOLN
10.0000 meq | INTRAVENOUS | Status: DC
  Filled 2023-12-17 (×6): qty 50

## 2023-12-17 MED ORDER — POTASSIUM CHLORIDE 10 MEQ/100ML IV SOLN
10.0000 meq | Freq: Once | INTRAVENOUS | Status: AC
  Administered 2023-12-17: 10 meq via INTRAVENOUS
  Filled 2023-12-17: qty 100

## 2023-12-17 MED ORDER — METOPROLOL TARTRATE 50 MG PO TABS
50.0000 mg | ORAL_TABLET | Freq: Every day | ORAL | Status: DC
  Administered 2023-12-17 – 2023-12-22 (×4): 50 mg via ORAL
  Filled 2023-12-17 (×5): qty 1

## 2023-12-17 MED ORDER — POTASSIUM CHLORIDE CRYS ER 20 MEQ PO TBCR: 40.0000 meq | EXTENDED_RELEASE_TABLET | Freq: Two times a day (BID) | ORAL | Status: DC

## 2023-12-17 MED ORDER — PRUCALOPRIDE SUCCINATE 2 MG PO TABS: 1.0000 | ORAL_TABLET | Freq: Every day | ORAL | Status: DC

## 2023-12-17 MED ORDER — LINACLOTIDE 145 MCG PO CAPS
145.0000 ug | ORAL_CAPSULE | Freq: Every day | ORAL | Status: DC
  Administered 2023-12-20 – 2023-12-22 (×3): 145 ug via ORAL
  Filled 2023-12-17 (×4): qty 1

## 2023-12-17 MED ORDER — POTASSIUM CHLORIDE 20 MEQ PO PACK: 40.0000 meq | PACK | Freq: Two times a day (BID) | ORAL | Status: DC

## 2023-12-17 MED ORDER — TAMSULOSIN HCL 0.4 MG PO CAPS
0.4000 mg | ORAL_CAPSULE | Freq: Every evening | ORAL | Status: DC
  Administered 2023-12-17 – 2023-12-21 (×5): 0.4 mg via ORAL
  Filled 2023-12-17 (×5): qty 1

## 2023-12-17 MED ORDER — HYDRALAZINE HCL 25 MG PO TABS: 25.0000 mg | ORAL_TABLET | Freq: Three times a day (TID) | ORAL | Status: DC | PRN

## 2023-12-17 MED ORDER — CINACALCET HCL 30 MG PO TABS
30.0000 mg | ORAL_TABLET | Freq: Every day | ORAL | Status: DC
  Administered 2023-12-17: 30 mg via ORAL
  Filled 2023-12-17 (×2): qty 1

## 2023-12-17 MED ORDER — MAGNESIUM SULFATE 2 GM/50ML IV SOLN
2.0000 g | Freq: Once | INTRAVENOUS | Status: AC
  Administered 2023-12-17: 2 g via INTRAVENOUS
  Filled 2023-12-17: qty 50

## 2023-12-17 MED ORDER — BUPROPION HCL ER (SR) 100 MG PO TB12
100.0000 mg | ORAL_TABLET | Freq: Two times a day (BID) | ORAL | Status: DC
  Administered 2023-12-17 – 2023-12-22 (×9): 100 mg via ORAL
  Filled 2023-12-17 (×10): qty 1

## 2023-12-17 MED ORDER — BACLOFEN 10 MG PO TABS: 5.0000 mg | ORAL_TABLET | Freq: Three times a day (TID) | ORAL | Status: DC | PRN

## 2023-12-17 MED ORDER — POTASSIUM CHLORIDE 20 MEQ PO PACK
40.0000 meq | PACK | Freq: Once | ORAL | Status: AC
  Administered 2023-12-17: 40 meq via ORAL
  Filled 2023-12-17: qty 2

## 2023-12-17 MED ORDER — BISACODYL 10 MG RE SUPP: 10.0000 mg | Freq: Every day | RECTAL | Status: DC | PRN

## 2023-12-17 MED ORDER — CHLORHEXIDINE GLUCONATE CLOTH 2 % EX PADS
6.0000 | MEDICATED_PAD | Freq: Every day | CUTANEOUS | Status: DC
  Administered 2023-12-17 – 2023-12-22 (×6): 6 via TOPICAL

## 2023-12-17 MED ORDER — POTASSIUM CHLORIDE 10 MEQ/100ML IV SOLN
10.0000 meq | INTRAVENOUS | Status: AC
  Administered 2023-12-17 (×4): 10 meq via INTRAVENOUS
  Filled 2023-12-17 (×4): qty 100

## 2023-12-17 MED ORDER — ASPIRIN 81 MG PO TBEC
81.0000 mg | DELAYED_RELEASE_TABLET | Freq: Every day | ORAL | Status: DC
  Administered 2023-12-17 – 2023-12-22 (×4): 81 mg via ORAL
  Filled 2023-12-17 (×5): qty 1

## 2023-12-17 MED ORDER — POTASSIUM CHLORIDE 10 MEQ/100ML IV SOLN: 10.0000 meq | INTRAVENOUS | Status: DC

## 2023-12-17 MED ORDER — POTASSIUM CHLORIDE 10 MEQ/100ML IV SOLN
10.0000 meq | INTRAVENOUS | Status: AC
  Administered 2023-12-17 – 2023-12-18 (×6): 10 meq via INTRAVENOUS
  Filled 2023-12-17: qty 100

## 2023-12-17 MED ORDER — PANTOPRAZOLE SODIUM 40 MG PO TBEC: 40.0000 mg | DELAYED_RELEASE_TABLET | Freq: Two times a day (BID) | ORAL | Status: DC

## 2023-12-17 MED ORDER — GABAPENTIN 300 MG PO CAPS
300.0000 mg | ORAL_CAPSULE | Freq: Two times a day (BID) | ORAL | Status: DC
  Administered 2023-12-17 – 2023-12-20 (×5): 300 mg via ORAL
  Filled 2023-12-17 (×6): qty 1

## 2023-12-17 MED ORDER — BOOST / RESOURCE BREEZE PO LIQD CUSTOM
1.0000 | Freq: Three times a day (TID) | ORAL | Status: DC
  Administered 2023-12-17 – 2023-12-18 (×2): 1 via ORAL

## 2023-12-17 MED ORDER — SODIUM CHLORIDE 0.9% FLUSH: 10.0000 mL | INTRAVENOUS | Status: DC | PRN

## 2023-12-17 MED ORDER — ALLOPURINOL 100 MG PO TABS
150.0000 mg | ORAL_TABLET | Freq: Every day | ORAL | Status: DC
  Administered 2023-12-17 – 2023-12-20 (×2): 150 mg via ORAL
  Filled 2023-12-17 (×3): qty 2

## 2023-12-17 NOTE — Plan of Care (Signed)

## 2023-12-17 NOTE — H&P (Signed)
 History and Physical    [name] FMW:[id number] DOB: [birth year] DOA: 12/16/2023  PCP: System, Provider Not In   Patient coming from:  LTC - [facility]    Chief Complaint:  Chief Complaint  Patient presents with   Abdominal Pain   Emesis   Nausea    HPI:  [name] is a 78 y.o. male , resident at [facility], with hx of CVA with residual right-sided hemiparesis, stage Ib non-small cell lung cancer s/p SBRT, MGUS with possible conversion to multiple myeloma, not felt to be a candidate for additional treatment of his malignancies, CKD 3-4, hypertension, diabetes, Barrett's esophagus, EOE, gastroparesis, recent history of upper GI bleeding and recent endoscopy 11/' 24 demonstrating esophagitis and gastritis, duodenal mass which was biopsied (duodenal adenoma, mild-mod chronic inactive gastritis, no H pylori).  Was brought in from nursing facility due to 2 weeks of inability to take p.o.'s.  History is mainly provided by patient's wife at the bedside.  She reports symptoms started suddenly prior to this did not have any dysphagia to solids or liquids.  Over the past 2 weeks having dysphagia and sensation that something is getting stuck in his upper chest.  Regurgitating food, having vomiting with clear/yellowish emesis.  He has abdominal pain in the lower abdomen but states that it radiates up.  Denies any hematemesis, coffee-ground emesis, melena, bloody stools.  He is not having any diarrhea or constipation.  Denies any other recent illness.  Denied chest pain on my interview, but had reported this to the ED.  Feeling very weak and rundown.   Review of Systems:  ROS complete and negative except as marked above   No Known Allergies  Prior to Admission medications   Medication Sig Start Date End Date Taking? Authorizing Provider  acetaminophen  (TYLENOL ) 650 MG CR tablet Take 650 mg by mouth every 4 (four) hours as needed for pain.   Yes [provider]   allopurinol  (ZYLOPRIM ) 300 MG tablet TAKE 1 TABLET(300 MG) BY MOUTH DAILY Patient taking differently: Take 300 mg by mouth daily. TAKE 1 TABLET(300 MG) BY MOUTH DAILY 12/10/21  Yes Duanne Butler DASEN, MD  amLODipine  (NORVASC ) 10 MG tablet Take 1 tablet (10 mg total) by mouth daily. 12/10/21  Yes Duanne Butler DASEN, MD  ASPIRIN  ADULT LOW STRENGTH 81 MG tablet Take 1 tablet (81 mg total) by mouth daily. Restart in 1 week 10/23/23  Yes Shahmehdi, Seyed A, MD  b complex-vitamin c-folic acid (NEPHRO-VITE) 0.8 MG TABS tablet Take 1 tablet by mouth at bedtime.   Yes [provider]  Baclofen  5 MG TABS Take 1 tablet by mouth 3 (three) times daily. 08/22/22  Yes [provider]  bisacodyl  (DULCOLAX) 10 MG suppository Place 10 mg rectally daily as needed for moderate constipation.   Yes [provider]  buPROPion  ER (WELLBUTRIN  SR) 100 MG 12 hr tablet Take 100 mg by mouth 2 (two) times daily. 02/03/22  Yes [provider]  cinacalcet  (SENSIPAR ) 30 MG tablet Take 30 mg by mouth daily with breakfast. 02/03/22  Yes [provider]  cyanocobalamin  (VITAMIN B12) 1000 MCG tablet Take 1 tablet (1,000 mcg total) by mouth daily. 07/17/22  Yes Antoinette Doe, MD  doxycycline  (VIBRA -TABS) 100 MG tablet Take 100 mg by mouth 2 (two) times daily. 7 day course starting on 12/11/2023 12/11/23  Yes [provider]  furosemide  (LASIX ) 20 MG tablet Take 1 tablet (20 mg total) by mouth daily. 10/23/23  Yes  Shahmehdi, Adriana LABOR, MD  gabapentin  (NEURONTIN ) 600 MG tablet Take 600 mg by mouth 2 (two) times daily. 11/16/23  Yes [provider]  insulin  glargine (LANTUS  SOLOSTAR) 100 UNIT/ML Solostar Pen Inject 30 Units into the skin at bedtime. 03/04/21  Yes Reardon, Benton PARAS, NP  ipratropium-albuterol  (DUONEB) 0.5-2.5 (3) MG/3ML SOLN Take 3 mLs by nebulization every 6 (six) hours.   Yes [provider]  iron  polysaccharides (NIFEREX) 150 MG capsule TAKE 1 CAPSULE(150 MG)  BY MOUTH DAILY Patient taking differently: Take 150 mg by mouth daily. 07/07/21  Yes Duanne Butler DASEN, MD  metoprolol  tartrate (LOPRESSOR ) 50 MG tablet Take 50 mg by mouth daily. 12/10/21  Yes [provider]  ondansetron  (ZOFRAN -ODT) 4 MG disintegrating tablet Take 4 mg by mouth every 6 (six) hours as needed for nausea or vomiting.   Yes [provider]  oxyCODONE -acetaminophen  (PERCOCET) 7.5-325 MG tablet Take 1 tablet by mouth in the morning and at bedtime.   Yes [provider]  pantoprazole  (PROTONIX ) 40 MG tablet Take 1 tablet (40 mg total) by mouth 2 (two) times daily. 10/20/23  Yes Shahmehdi, Seyed A, MD  polyethylene glycol powder (MIRALAX ) 17 GM/SCOOP powder Take one capful daily. Hold for diarrhea. Patient taking differently: Take 17 g by mouth daily. Hold for diarrhea. 08/24/22  Yes Ezzard Sonny RAMAN, PA-C  Potassium Chloride  ER 20 MEQ TBCR Take 1 tablet by mouth 2 (two) times daily. 12/08/23  Yes [provider]  Prucalopride Succinate  (MOTEGRITY ) 2 MG TABS Take 1 tablet (2 mg total) by mouth daily. 11/22/22  Yes Rourk, Lamar HERO, MD  rosuvastatin  (CRESTOR ) 20 MG tablet Take 20 mg by mouth daily.   Yes [provider]  tamsulosin  (FLOMAX ) 0.4 MG CAPS capsule TAKE 1 CAPSULE(0.4 MG) BY MOUTH DAILY Patient taking differently: Take 0.4 mg by mouth daily. 12/10/21  Yes Duanne Butler DASEN, MD  traZODone  (DESYREL ) 100 MG tablet Take 100 mg by mouth at bedtime. 03/07/22  Yes [provider]  insulin  lispro (HUMALOG  KWIKPEN) 100 UNIT/ML KwikPen Per sliding scale CBG 70 - 120:   0 units CBG 121 -150: 1 units  CBG 151 - 200: 2 unit  CBG 201-250:   4 units  CBG 251-300:   6 units  CBG 301-350:  8 units  CBG 351-400: 10 units  > 450 call MD Patient not taking: Reported on 12/16/2023 10/20/23   Willette Adriana LABOR, MD    Past Medical History:  Diagnosis Date   Arthritis    Asthma    hx of   Barrett's esophagus    EGD 03/23/2011 & EGD 2/09 bx  proven   BPH (benign prostatic hyperplasia)    Cancer of parotid gland (HCC) 11/23/12   Adenocarcinoma   Chronic abdominal pain    Chronic constipation    Colon polyp 03/23/2011   tubular adenoma, Dr. Shaaron   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) 07/15/2011   CVA (cerebral infarction) 1998   right sided deficit   Delayed gastric emptying 2018   Diverticulosis    TCS 03/23/11 pancolonic diverticula &TCS 5/08, pancolonic diverticula   DM (diabetes mellitus) (HCC)    Edema of lower extremity 12/21/12   bilateral    Esotropia of left eye    GERD (gastroesophageal reflux disease)    Glaucoma (increased eye pressure)    Gout    Gout    Hemorrhagic colitis 06/06/2012.   Hemorrhoids, internal 03/23/2011   tcs by Dr. Shaaron  Hepatitis    esosiniphilic, tx with prednisone   Hiatal hernia    History of radiation therapy 05/21/18- 05/30/18   Left Lung/ 54 Gy delivered in 3 fractions of 18 Gy. SBRT   HTN (hypertension)    Hx of radiation therapy 1974   right base of skull area-lymphoma   Hyperlipidemia    Lower facial weakness    Right   Lymphoma (HCC) 1974   XRT at [facility], right base of skull area   Neuropathy    Non-small cell lung cancer (HCC) dx'd 04/26/18   Peripheral edema    R>L legs   Rash    chronic, recurrent, R>L legs   Renal insufficiency    Steatohepatitis    liver biopsy 2009   Stroke [facility]) 1998   right hemiparesis/plegia    Past Surgical History:  Procedure Laterality Date   BIOPSY  12/01/2016   Procedure: BIOPSY;  Surgeon: Lamar CHRISTELLA Hollingshead, MD;  Location: [facility];  Service: Endoscopy;;  duodenum, gastric, esophagus   BIOPSY  07/15/2022   Procedure: BIOPSY;  Surgeon: Hollingshead Lamar CHRISTELLA, MD;  Location: [facility];  Service: Endoscopy;;  duodenal, mass;   BIOPSY  10/19/2023   Procedure: BIOPSY;  Surgeon: Cinderella Deatrice FALCON, MD;  Location: [facility];  Service: Endoscopy;;   CHOLECYSTECTOMY     COLONOSCOPY  03/23/11   Dr. Hollingshead  pancolonic  diverticula, hemorrhoids, tubular adenoma.. next tcs 03/2016   COLONOSCOPY WITH PROPOFOL  N/A 12/01/2016   inadequate bowel prep precluded exam   ESOPHAGOGASTRODUODENOSCOPY  02/05/08   goblet cell metaplasia/negative for H.pylori   ESOPHAGOGASTRODUODENOSCOPY  03/23/11   Dr. Hollingshead, barretts, hiatal hernia   ESOPHAGOGASTRODUODENOSCOPY (EGD) WITH PROPOFOL  N/A 12/01/2016   Dr. Hollingshead: Large amount of retained gastric contents precluded completion of the stomach. Mucosal changes were found in the stomach. Erosions and somewhat scalloped appearing mucosa present, reactive gastritis/no H pylori. Barrett's esophagus noted, no dysplasia on biopsy. Duodenal biopsies taken as well, benign, no evidence of eosinophilia.   ESOPHAGOGASTRODUODENOSCOPY (EGD) WITH PROPOFOL  N/A 07/15/2022   Procedure: ESOPHAGOGASTRODUODENOSCOPY (EGD) WITH PROPOFOL ;  Surgeon: Hollingshead Lamar CHRISTELLA, MD;  [facility];  Service: Endoscopy;  Laterality: N/A;   ESOPHAGOGASTRODUODENOSCOPY (EGD) WITH PROPOFOL  N/A 10/19/2023   Procedure: ESOPHAGOGASTRODUODENOSCOPY (EGD) WITH PROPOFOL ;  Surgeon: Cinderella Deatrice FALCON, MD;  [facility];  Service: Endoscopy;  Laterality: N/A;   IR FLUORO GUIDED NEEDLE PLC ASPIRATION/INJECTION LOC  12/13/2018   IR KYPHO LUMBAR INC FX REDUCE BONE BX UNI/BIL CANNULATION INC/IMAGING  12/13/2018   IR RADIOLOGY PERIPHERAL GUIDED IV START  12/13/2018   IR US  GUIDE VASC ACCESS LEFT  12/13/2018   MASS BIOPSY  11/01/2012   Procedure: NECK MASS BIOPSY;  Surgeon: Ana LELON Moccasin, MD;  Location: [facility];  Service: ENT;  Laterality: Right;  Excisional Bx Right Neck Mass; attempted external jugular cutdown of left side   PAROTIDECTOMY  11/24/2012   Procedure: PAROTIDECTOMY;  Surgeon: Ana LELON Moccasin, MD;  Location: [facility] OR;  Service: ENT;  Laterality: N/A;  Total parotidectomy   PLEURECTOMY     right lymph node removal Right    behind right ear   Right video-assisted thoracic surgery, pleurectomy, and pleurodesis  2011   VIDEO  BRONCHOSCOPY WITH ENDOBRONCHIAL NAVIGATION N/A 04/26/2018   Procedure: VIDEO BRONCHOSCOPY WITH ENDOBRONCHIAL NAVIGATION;  Surgeon: Kerrin Elspeth BROCKS, MD;  Location: [facility] OR;  Service: Thoracic;  Laterality: N/A;     reports that he quit smoking about 26 years ago. His smoking  use included cigarettes. He started smoking about 51 years ago. He has a 75 pack-year smoking history. He has never used smokeless tobacco. He reports that he does not drink alcohol and does not use drugs.  Family History  Problem Relation Age of Onset   Heart failure Mother    Heart failure Father    Heart failure Sister    Heart failure Son    Colon cancer Neg Hx      Physical Exam: Vitals:   12/16/23 2341 12/16/23 2347 12/17/23 0014 12/17/23 0033  BP: (!) 160/121  (!) 144/116 (!) 144/116  Pulse: 100 (!) 109  (!) 109  Resp: (!) 9 12 18 18   Temp: 97.7 F (36.5 C)  (!) 97.5 F (36.4 C) (!) 97.5 F (36.4 C)  TempSrc: Oral  Oral Oral  SpO2: 98% 99% 100%   Weight:    96.4 kg  Height:    6' (1.829 m)    Gen: Awake, alert, chronically ill-appearing HEENT: Exotropia OS CV: Regular, normal S1, S2, 2/6 SEM  resp: Normal WOB, CTAB  Abd: Round, nondistended normoactive, mild diffuse tenderness. MSK: Right greater than left lower extremity edema states chronic.  3+ on the right, 2+ left.  2+ edema of the right upper extremity as well Skin: No rashes or lesions to exposed skin  Neuro: Alert and interactive, oriented to person, [facility], 2025.  Right hemiparesis Psych: euthymic, appropriate    Data review:   Labs reviewed, notable for:   NA 152, K less than 2, chloride 119, bicarb 22, no gap Creatinine 2.44 (baseline 2.1-2.3 Mag 1.7 BNP 135, high-sensitivity troponin 123 -> 118 -> 144 WBC 15, hemoglobin 12 microcytic UA positive ketone, not consistent with infection  Micro:  Results for orders placed or performed during the hospital encounter of 10/17/23  MRSA Next Gen by PCR, Nasal      Status: None   Collection Time: 10/17/23 11:39 AM   Specimen: Nasal Mucosa; Nasal Swab  Result Value Ref Range Status   MRSA by PCR Next Gen NOT DETECTED NOT DETECTED Final    Comment: (NOTE) The GeneXpert MRSA Assay (FDA approved for NASAL specimens only), is one component of a comprehensive MRSA colonization surveillance program. It is not intended to diagnose MRSA infection nor to guide or monitor treatment for MRSA infections. Test performance is not FDA approved in patients less than 27 years old. Performed at [facility], [address], [place], [place] [address]    *Note: Due to a large number of results and/or encounters for the requested time period, some results have not been displayed. A complete set of results can be found in Results Review.    Imaging reviewed:  NM Pulmonary Perfusion Result Date: 12/16/2023 CLINICAL DATA:  Difficulty breathing EXAM: NUCLEAR MEDICINE PERFUSION LUNG SCAN TECHNIQUE: Perfusion images were obtained in multiple projections after intravenous injection of radiopharmaceutical. Ventilation scans intentionally deferred if perfusion scan and chest x-ray adequate for interpretation during COVID 19 epidemic. RADIOPHARMACEUTICALS:  4.2 mCi Tc-66m MAA IV COMPARISON:  Chest x-ray from earlier in the same day. FINDINGS: Adequate uptake is noted throughout both lungs. No focal defect to suggest pulmonary embolism is noted. IMPRESSION: No evidence of pulmonary embolus. Electronically Signed   By: Oneil Devonshire M.D.   On: 12/16/2023 18:18   US  Venous Img Lower Right (DVT Study) Result Date: 12/16/2023 CLINICAL DATA:  Right lower extremity edema.  Evaluate for DVT. EXAM: RIGHT LOWER EXTREMITY VENOUS DOPPLER ULTRASOUND TECHNIQUE: Gray-scale sonography with graded  compression, as well as color Doppler and duplex ultrasound were performed to evaluate the lower extremity deep venous systems from the level of the common femoral vein and including the common femoral, femoral,  profunda femoral, popliteal and calf veins including the posterior tibial, peroneal and gastrocnemius veins when visible. The superficial great saphenous vein was also interrogated. Spectral Doppler was utilized to evaluate flow at rest and with distal augmentation maneuvers in the common femoral, femoral and popliteal veins. COMPARISON:  Right lower extremity venous Doppler ultrasound-07/27/2018 FINDINGS: Contralateral Common Femoral Vein: There is mixed echogenic near occlusive thrombus within the contralateral left common femoral vein (images 3 through 6). Common Femoral Vein: No evidence of thrombus. Normal compressibility, respiratory phasicity and response to augmentation. Saphenofemoral Junction: No evidence of thrombus. Normal compressibility and flow on color Doppler imaging. Profunda Femoral Vein: No evidence of thrombus. Normal compressibility and flow on color Doppler imaging. Femoral Vein: No evidence of thrombus. Normal compressibility, respiratory phasicity and response to augmentation. Popliteal Vein: No evidence of thrombus. Normal compressibility, respiratory phasicity and response to augmentation. Calf Veins: Appear patent where visualized Superficial Great Saphenous Vein: Appears patent where visualized Other Findings: There is a moderate amount of subcutaneous edema at the level of the right lower leg. IMPRESSION: 1. No definite evidence of DVT within right lower extremity. 2. Near occlusive thrombus within the contralateral left common femoral vein, presumably chronic in etiology though new compared to the 07/2018 examination. Clinical correlation is advised. Electronically Signed   By: Norleen Roulette M.D.   On: 12/16/2023 16:28   DG Chest Portable 1 View Result Date: 12/16/2023 CLINICAL DATA:  Abdominal pain, edema EXAM: PORTABLE CHEST - 1 VIEW COMPARISON:  CT 05/10/2023, and previous FINDINGS: Left apical bullous change with an area of bandlike consolidation in the upper lobe which is stable  from prior study. No new infiltrate or overt edema. Heart size and mediastinal contours are within normal limits. No effusion. Advanced bilateral glenohumeral DJD. IMPRESSION: 1. No acute findings. 2. Left apical bullous change with stable bandlike consolidation. Electronically Signed   By: JONETTA Faes M.D.   On: 12/16/2023 14:56   CT ABDOMEN PELVIS WO CONTRAST Result Date: 12/16/2023 CLINICAL DATA:  Acute abdominal pain, history of gastric carcinoma EXAM: CT ABDOMEN AND PELVIS WITHOUT CONTRAST TECHNIQUE: Multidetector CT imaging of the abdomen and pelvis was performed following the standard protocol without IV contrast. RADIATION DOSE REDUCTION: This exam was performed according to the departmental dose-optimization program which includes automated exposure control, adjustment of the mA and/or kV according to patient size and/or use of iterative reconstruction technique. COMPARISON:  10/17/2023 FINDINGS: Lower chest: No pleural or pericardial effusion. Scattered coronary and aortic calcifications. Small hiatal hernia. Hepatobiliary: No focal liver abnormality is seen. Status post cholecystectomy. No biliary dilatation. Pancreas: Unremarkable. No pancreatic ductal dilatation or surrounding inflammatory changes. Spleen: Normal in size without focal abnormality. Adrenals/Urinary Tract: No adrenal mass. No urolithiasis or hydronephrosis. Multiple cortical lesions in both kidneys, some of which can be characterized as cysts, largest 4.9 cm 13 HU, left upper pole; no followup recommended. Urinary bladder partially distended. Stomach/Bowel: Small hiatal hernia. Stomach is nondistended, with old gastrostomy tract. Small bowel is decompressed. Normal appendix. The colon is partially distended, with multiple scattered diverticula. Segmental wall thickening is suspected in the proximal sigmoid colon, without adjacent inflammatory change. Vascular/Lymphatic: Moderate aortoiliac calcified atheromatous plaque without aneurysm.  No abdominal or pelvic adenopathy. Reproductive: Prostate enlargement. Other: Right pelvic phleboliths.  No ascites.  No free air. Musculoskeletal:  Post cement vertebral augmentation L2. Stable spondylitic changes L3-S1. IMPRESSION: 1. No acute findings. 2. Colonic diverticulosis. 3. Small hiatal hernia. 4. Smooth wall thickening in the proximal sigmoid colon without adjacent inflammatory change, nonspecific. Consider outpatient elective colonoscopic evaluation. 5.  Aortic Atherosclerosis (ICD10-I70.0). Electronically Signed   By: JONETTA Faes M.D.   On: 12/16/2023 14:51    ED Course:  Treated with potassium 60 mEq via IV runs, mag 2 g, 2 L LR, fentanyl  for pain.   Assessment/Plan:  78 y.o. male with hx resident at [facility], with hx of CVA with residual right-sided hemiparesis, stage Ib non-small cell lung cancer s/p SBRT, MGUS with possible conversion to multiple myeloma, not felt to be a candidate for additional treatment of his malignancies, CKD 3-4, hypertension, diabetes, Barrett's esophagus, EOE, gastroparesis, recent history of upper GI bleeding and recent endoscopy 11/' 24 demonstrating esophagitis and gastritis, duodenal mass which was biopsied (duodenal adenoma, mild-mod chronic inactive gastritis, no H pylori).  Was brought in from nursing facility due to 2 weeks of inability to take p.o.'s.  Found to have signs of dehydration and electrolyte derangements including critical potassium.   Dysphagia, nausea/vomiting, intolerance of p.o.'s Severe dehydration, electrolyte derangements Critical hypokalemia, symptomatic, improved Hypernatremia secondary to free water deficit Subacute onset of dysphagia, sense of food getting stuck in his upper chest, regurgitation and vomiting.  Not tolerating solids or liquids over this timeframe.  Reflected on his labs with abnormalities above.  On vitals tachycardic.  Initial labs NA 152, K less than 2 most notably.  EKG read as no QTc  prolongation but I disagree, difficult due to T wave notching /overlap with P wave but I suspect QTc greater than 400 with Qtc ~570. Symptomatic with generalized weakness, possibly exacerbating gut motility as well. On imaging CT A/P with no acute findings, stomach is decompressed, incidental findings of sigmoid wall thickening which may need to be evaluated for an underlying malignancy. His Dysphagia suspected related to recent esophagitis, possible esophageal spasm, stricture, or dysmotility from electrolytes (tho suspect these developed after onset of swallowing issues).  - GI consult when available for consideration of a repeat EGD, anticipate this may be Monday - Status post 2 L IV fluid in the ED, given additional 1 L LR. - With improving volume status and continued hypernatremia, started on D5 water at 50 cc/h - Check potassium every 4 hours x 2 overnight, with IV repletion - Twice daily electrolytes for repletion's/trending sodium following - Repeat EKG given suspected QT prolongation - Check CK due to severity of hypokalemia and weakness/myalgias - Clear liquid diet as tolerates - Symptomatic management with Ativan  first-line for nausea/vomiting in setting of prolonged Qtc - Pantoprazole  40 mg IV every 12 hours, sucralfate  1 g suspension 4 times daily for empiric treatment of esophagitis/gastritis  Acute myocardial injury Reported chest pain symptoms to ED provider, although denied on my interview.  He is bedbound at baseline.  High-sensitivity troponin flat at 123 -> 144.  EKG without acute ischemic changes.  Suspect demand in the setting of hypovolemia -Management directed at hypovolemia per above  Question chronic DVT in the left femoral vein With lower extremity swelling venous duplex obtained in the ED demonstrating possibly chronic left femoral vein thrombus.  -Considering recent admission with GI bleed, underlying MGUS possible progression to multiple myeloma currently feel that the  benefit of anticoagulation is outweighed by the risks.  Will need to engage with family in more shared decision making  Possible acute  kidney injury CKD stage III-IV.  Progressive renal dysfunction over recent hospitalizations with new baseline possibly around 2.1-2.3.  With his current presentation suspect prerenal. - IV fluids per above - Check PVR  Peripheral edema Despite being intravascularly down as evidenced by his labs and history he does have significant peripheral edema as well as asymmetric edema worse on the right side.  May be partially from low oncotic pressure although albumin  3.1. -IV fluids per above, holding his home Lasix  with volume depletion and severe electrolyte abnormalities  Chronic medical problems: CVA with right-sided hemiparesis: Continue home aspirin , rosuvastatin  Non-small cell lung cancer stage Ib: Not felt to be a candidate for additional treatments MGUS possible progression to multiple myeloma: Again not felt to be a candidate for additional treatments Hypertension: Hold home amlodipine , furosemide  in the setting of his volume depletion.  Continue his home metoprolol  Diabetes: Hold home glargine as sugars in normal range, SSI for now Barrett's, EOE: See evaluation for his dysphagia above Gastroparesis: Continue home Motegrity .  Avoiding QT prolonging meds for now Chronic pain /spasticity: Insetting of his worsening renal insufficiency over time reduce his home baclofen  to 2.5 mg 3 times daily.  Reduce gabapentin  to 300 mg twice daily.  Continue home Percocet twice daily as needed. Insomnia: Hold home trazodone  with prolonged Qtc Gout: Reduce his home allopurinol  to 150 mg daily  Body mass index is 28.82 kg/m.    DVT prophylaxis:  SQ Heparin  Code Status:  DNR/DNI(Do NOT Intubate);Confirmed with the patient  Diet:  Diet Orders (From admission, onward)     Start     Ordered   12/16/23 2207  Diet clear liquid Room service appropriate? Yes; Fluid  consistency: Thin  Diet effective now       Question Answer Comment  Room service appropriate? Yes   Fluid consistency: Thin      12/16/23 2209           Family Communication: Yes discussed with his wife at the bedside Consults: None Admission status:   Inpatient, Telemetry bed  Severity of Illness: The appropriate patient status for this patient is INPATIENT. Inpatient status is judged to be reasonable and necessary in order to provide the required intensity of service to ensure the patient's safety. The patient's presenting symptoms, physical exam findings, and initial radiographic and laboratory data in the context of their chronic comorbidities is felt to place them at high risk for further clinical deterioration. Furthermore, it is not anticipated that the patient will be medically stable for discharge from the hospital within 2 midnights of admission.   * I certify that at the point of admission it is my clinical judgment that the patient will require inpatient hospital care spanning beyond 2 midnights from the point of admission due to high intensity of service, high risk for further deterioration and high frequency of surveillance required.*   Dorn Dawson, MD [facility]  How to contact the [facility] Attending or Consulting provider 7A - 7P or covering provider during after hours 7P -7A, for this patient.  Check the care team in [facility] and look for a) attending/consulting TR[facility] provider listed and b) the [facility] team listed Log into [email or web address] and use [place]'s universal password to access. If you do not have the password, please contact the hospital operator. Locate the [facility] provider you are looking for under [facility] and page to a number that you can be directly reached. If you still have difficulty reaching the provider, please page the [facility] (Director  on Call) for the Hospitalists listed on amion for assistance.  12/17/2023, 3:43 AM

## 2023-12-17 NOTE — Progress Notes (Signed)
 PT Screen Note  Patient Details Name: DAXEN LANUM MRN: [id number] DOB: 1946-01-15   Cancelled Treatment:    Reason Eval/Treat Not Completed: PT screened, no needs identified, will sign off  Entered pt room this am @ 0830. Pt reporting he has been bedbound and does not ambulate. Pt uses lift machine for transfers and is at functional baseline.   Acute skilled PT services will sign off as no acute needs are indicated at this and pt is at functional baseline.   Omega JONETTA Bottcher PT, DPT [name] [phone] office  Omega JONETTA Bottcher 12/17/2023, 9:15 AM

## 2023-12-17 NOTE — Op Note (Signed)
 Patient:  [name]  DOB:  [birth year]  MRN:  [id number]   Preop Diagnosis: Acute kidney injury  Postop Diagnosis: Same  Procedure: Central line placement  Surgeon: Oneil Budge, MD  Anes: Local  Indications: Patient is a 78 year old black male who presents with acute kidney injury and dehydration secondary to nausea and vomiting.  IV access is unobtainable.  The patient now presents for central line placement.  The risks and benefits of the procedure were fully explained to the patient, who gave informed consent.  Procedure note: The procedure was performed in the ICU bed 2.  Surgical site confirmation was performed.  Sterile technique was used.  1% Xylocaine  was used for local anesthesia.  The right groin was prepped and draped using usual sterile technique with ChloraPrep.  A needle was advanced into the right femoral vein using the Seldinger technique without difficulty.  A guidewire was then advanced into the femoral vein without difficulty.  A 20 French triple-lumen catheter was then placed over the guidewire and the guidewire was removed.  Good backflow of venous blood was noted on aspiration of all 3 ports.  All 3 ports were flushed with saline.  The catheter was secured in the place using a 3-0 silk suture.  A dry sterile dressing was applied.  The patient tolerated the procedure well.  Complications: None  EBL: Minimal  Specimen: None

## 2023-12-17 NOTE — Progress Notes (Signed)
 [facility]                                                                               [name], is a 78 y.o. male, DOB - Mar 03, 1946, FMW:[id number] Admit date - 12/16/2023    Outpatient Primary MD for the patient is System, Provider Not In  LOS - 1  days    Brief summary    [name] is a 78 y.o. male , resident at [facility], with hx of CVA with residual right-sided hemiparesis, stage Ib non-small cell lung cancer s/p SBRT, MGUS with possible conversion to multiple myeloma, not felt to be a candidate for additional treatment of his malignancies, CKD 3-4, hypertension, diabetes, Barrett's esophagus, EOE, gastroparesis, recent history of upper GI bleeding and recent endoscopy 11/ 24 demonstrating esophagitis and gastritis, duodenal mass which was biopsied (duodenal adenoma, mild-mod chronic inactive gastritis, no H pylori).  Was brought into ED from nursing facility due to 2 weeks of inability to take anything oral. He also reports dysphagia, vomiting, mid abdominal pain and loose stools.   Assessment & Plan    Assessment and Plan:    Severe dehydration from nausea and vomiting secondary to Dysphagia   With resultant hypokalemia and hypernatremia ( sec to free water deficit) Replace electrolytes both IV and orally.  Unfortunately he does not any venous access at this time despite multiple efforts by RN's Unable to place a PICC OR Mid line due to renal insufficiency.  Requested general surgery for central venous access.  Meanwhile Gi consulted for EGD, who will see patient in am.  Continue with gentle hydration for severe dehydration.     Severe Hypokalemia  Replaced. Recheck K later tonight and tomorrow.     Hypernatremia:  From free water deficit from poor oral intake and vomiting.  Slowly improving.    Hypomagnesemia Replaced, recheck in am.    Acute Kidney Injury in the setting of Stage 4 CKD:  Suspect from dehydration and  poor oral intake.  Creatinine improved to 2.1 with IV fluids.     H/o CVA  With right sided hemiparesis.    Hyperlipidemia Resume statin.    BPH  Resume flomax .    H/o duodenal mass / adenoma Barretts Esophagus Continue with PPI IV   Insulin  dependent Type 2 DM:  CBG (last 3)  Recent Labs    12/17/23 0713 12/17/23 1156 12/17/23 1630  GLUCAP 74 80 108*   Patient on 30 units of lantus  at home, which is on hold for now. Resume SSI.     Estimated body mass index is 28.79 kg/m as calculated from the following:   Height as of this encounter: 6' (1.829 m).   Weight as of this encounter: 96.3 kg.  Code Status: DNR limited.  DVT Prophylaxis:  heparin  injection 5,000 Units Start: 12/16/23 2215   Level of Care: Level of care: ICU Family Communication: none at bedside.   Disposition Plan:     Remains inpatient appropriate:  pending further evaluation for dysphagia  Procedures:  None.   Consultants:   Gen Surgery for IV access.  GI consult for EGD in am.  Antimicrobials:   Anti-infectives (From admission, onward)    None        Medications  Scheduled Meds:  allopurinol   150 mg Oral Daily   aspirin  EC  81 mg Oral Daily   buPROPion  ER  100 mg Oral BID   cinacalcet   30 mg Oral Q breakfast   gabapentin   300 mg Oral BID   heparin   5,000 Units Subcutaneous Q8H   insulin  aspart  0-6 Units Subcutaneous TID WC   [START ON 12/18/2023] linaclotide   145 mcg Oral QAC breakfast   metoprolol  tartrate  50 mg Oral Daily   pantoprazole  (PROTONIX ) IV  40 mg Intravenous Q12H   potassium chloride   40 mEq Oral TID   rosuvastatin   20 mg Oral QPM   sodium chloride  flush  3 mL Intravenous Q12H   sucralfate   1 g Oral TID WC & HS   tamsulosin   0.4 mg Oral QPM   Continuous Infusions:  dextrose      PRN Meds:.acetaminophen , baclofen , bisacodyl , hydrALAZINE , LORazepam , melatonin, oxyCODONE -acetaminophen , polyethylene glycol    Subjective:   [name] was seen and  examined today.  Reports not feeling great.   Objective:   Vitals:   12/17/23 0033 12/17/23 0506 12/17/23 1034 12/17/23 1302  BP: (!) 144/116 (!) 172/124 (!) 158/102 (!) 158/112  Pulse: (!) 109  94 96  Resp: 18 20 16 20   Temp: (!) 97.5 F (36.4 C) 97.6 F (36.4 C) (!) 97.3 F (36.3 C) 98 F (36.7 C)  TempSrc: Oral Oral Oral Oral  SpO2:  100% 100% 100%  Weight: 96.4 kg 96.3 kg    Height: 6' (1.829 m)       Intake/Output Summary (Last 24 hours) at 12/17/2023 1508 Last data filed at 12/17/2023 1321 Gross per 24 hour  Intake 1499.95 ml  Output 850 ml  Net 649.95 ml   Filed Weights   12/17/23 0033 12/17/23 0506  Weight: 96.4 kg 96.3 kg     Exam General exam: Appears calm and comfortable  Respiratory system: Clear to auscultation. Respiratory effort normal. Cardiovascular system: S1 & S2 heard, RRR. Gastrointestinal system: Abdomen is nondistended, soft and nontender.  Central nervous system: Alert and oriented. No focal neurological deficits. Extremities: bilateral leg edema.  Skin: No rashes, Psychiatry: mood is appropriate.     Data Reviewed:  I have personally reviewed following labs and imaging studies   CBC Lab Results  Component Value Date   WBC 15.7 (H) 12/16/2023   RBC 5.54 12/16/2023   HGB 12.1 (L) 12/16/2023   HCT 41.0 12/16/2023   MCV 74.0 (L) 12/16/2023   MCH 21.8 (L) 12/16/2023   PLT 256 12/16/2023   MCHC 29.5 (L) 12/16/2023   RDW 24.1 (H) 12/16/2023   LYMPHSABS 0.5 (L) 12/16/2023   MONOABS 0.5 12/16/2023   EOSABS 0.0 12/16/2023   BASOSABS 0.0 12/16/2023     Last metabolic panel Lab Results  Component Value Date   NA 149 (H) 12/17/2023   K 2.3 (LL) 12/17/2023   CL 119 (H) 12/17/2023   CO2 22 12/17/2023   BUN 20 12/17/2023   CREATININE 2.10 (H) 12/17/2023   GLUCOSE 91 12/17/2023   GFRNONAA 32 (L) 12/17/2023   GFRAA 37 (L) 12/16/2020   CALCIUM  9.0 12/17/2023   PHOS 2.0 (L) 12/17/2023   PROT 7.3 12/16/2023   ALBUMIN  3.1 (L) 12/16/2023    LABGLOB 3.1 12/16/2020   AGRATIO 1.2 12/16/2020   BILITOT 0.7 12/16/2023   ALKPHOS 77 12/16/2023   AST 22  12/16/2023   ALT 14 12/16/2023   ANIONGAP 8 12/17/2023    CBG (last 3)  Recent Labs    12/17/23 0713 12/17/23 1156  GLUCAP 74 80      Coagulation Profile: No results for input(s): INR, PROTIME in the last 168 hours.   Radiology Studies: NM Pulmonary Perfusion Result Date: 12/16/2023 CLINICAL DATA:  Difficulty breathing EXAM: NUCLEAR MEDICINE PERFUSION LUNG SCAN TECHNIQUE: Perfusion images were obtained in multiple projections after intravenous injection of radiopharmaceutical. Ventilation scans intentionally deferred if perfusion scan and chest x-ray adequate for interpretation during COVID 19 epidemic. RADIOPHARMACEUTICALS:  4.2 mCi Tc-37m MAA IV COMPARISON:  Chest x-ray from earlier in the same day. FINDINGS: Adequate uptake is noted throughout both lungs. No focal defect to suggest pulmonary embolism is noted. IMPRESSION: No evidence of pulmonary embolus. Electronically Signed   By: Oneil Devonshire M.D.   On: 12/16/2023 18:18   US  Venous Img Lower Right (DVT Study) Result Date: 12/16/2023 CLINICAL DATA:  Right lower extremity edema.  Evaluate for DVT. EXAM: RIGHT LOWER EXTREMITY VENOUS DOPPLER ULTRASOUND TECHNIQUE: Gray-scale sonography with graded compression, as well as color Doppler and duplex ultrasound were performed to evaluate the lower extremity deep venous systems from the level of the common femoral vein and including the common femoral, femoral, profunda femoral, popliteal and calf veins including the posterior tibial, peroneal and gastrocnemius veins when visible. The superficial great saphenous vein was also interrogated. Spectral Doppler was utilized to evaluate flow at rest and with distal augmentation maneuvers in the common femoral, femoral and popliteal veins. COMPARISON:  Right lower extremity venous Doppler ultrasound-07/27/2018 FINDINGS: Contralateral Common  Femoral Vein: There is mixed echogenic near occlusive thrombus within the contralateral left common femoral vein (images 3 through 6). Common Femoral Vein: No evidence of thrombus. Normal compressibility, respiratory phasicity and response to augmentation. Saphenofemoral Junction: No evidence of thrombus. Normal compressibility and flow on color Doppler imaging. Profunda Femoral Vein: No evidence of thrombus. Normal compressibility and flow on color Doppler imaging. Femoral Vein: No evidence of thrombus. Normal compressibility, respiratory phasicity and response to augmentation. Popliteal Vein: No evidence of thrombus. Normal compressibility, respiratory phasicity and response to augmentation. Calf Veins: Appear patent where visualized Superficial Great Saphenous Vein: Appears patent where visualized Other Findings: There is a moderate amount of subcutaneous edema at the level of the right lower leg. IMPRESSION: 1. No definite evidence of DVT within right lower extremity. 2. Near occlusive thrombus within the contralateral left common femoral vein, presumably chronic in etiology though new compared to the 07/2018 examination. Clinical correlation is advised. Electronically Signed   By: Norleen Roulette M.D.   On: 12/16/2023 16:28   DG Chest Portable 1 View Result Date: 12/16/2023 CLINICAL DATA:  Abdominal pain, edema EXAM: PORTABLE CHEST - 1 VIEW COMPARISON:  CT 05/10/2023, and previous FINDINGS: Left apical bullous change with an area of bandlike consolidation in the upper lobe which is stable from prior study. No new infiltrate or overt edema. Heart size and mediastinal contours are within normal limits. No effusion. Advanced bilateral glenohumeral DJD. IMPRESSION: 1. No acute findings. 2. Left apical bullous change with stable bandlike consolidation. Electronically Signed   By: JONETTA Faes M.D.   On: 12/16/2023 14:56   CT ABDOMEN PELVIS WO CONTRAST Result Date: 12/16/2023 CLINICAL DATA:  Acute abdominal pain,  history of gastric carcinoma EXAM: CT ABDOMEN AND PELVIS WITHOUT CONTRAST TECHNIQUE: Multidetector CT imaging of the abdomen and pelvis was performed following the standard protocol without IV contrast. RADIATION  DOSE REDUCTION: This exam was performed according to the departmental dose-optimization program which includes automated exposure control, adjustment of the mA and/or kV according to patient size and/or use of iterative reconstruction technique. COMPARISON:  10/17/2023 FINDINGS: Lower chest: No pleural or pericardial effusion. Scattered coronary and aortic calcifications. Small hiatal hernia. Hepatobiliary: No focal liver abnormality is seen. Status post cholecystectomy. No biliary dilatation. Pancreas: Unremarkable. No pancreatic ductal dilatation or surrounding inflammatory changes. Spleen: Normal in size without focal abnormality. Adrenals/Urinary Tract: No adrenal mass. No urolithiasis or hydronephrosis. Multiple cortical lesions in both kidneys, some of which can be characterized as cysts, largest 4.9 cm 13 HU, left upper pole; no followup recommended. Urinary bladder partially distended. Stomach/Bowel: Small hiatal hernia. Stomach is nondistended, with old gastrostomy tract. Small bowel is decompressed. Normal appendix. The colon is partially distended, with multiple scattered diverticula. Segmental wall thickening is suspected in the proximal sigmoid colon, without adjacent inflammatory change. Vascular/Lymphatic: Moderate aortoiliac calcified atheromatous plaque without aneurysm. No abdominal or pelvic adenopathy. Reproductive: Prostate enlargement. Other: Right pelvic phleboliths.  No ascites.  No free air. Musculoskeletal: Post cement vertebral augmentation L2. Stable spondylitic changes L3-S1. IMPRESSION: 1. No acute findings. 2. Colonic diverticulosis. 3. Small hiatal hernia. 4. Smooth wall thickening in the proximal sigmoid colon without adjacent inflammatory change, nonspecific. Consider  outpatient elective colonoscopic evaluation. 5.  Aortic Atherosclerosis (ICD10-I70.0). Electronically Signed   By: JONETTA Faes M.D.   On: 12/16/2023 14:51       Elgie Butter M.D. [facility] 12/17/2023, 3:08 PM  Available via Epic secure chat 7am-7pm After 7 pm, please refer to night coverage provider listed on amion.

## 2023-12-17 NOTE — Progress Notes (Addendum)
 Patient transferred to ICU room 2 for central line placement. Waiting for physician. VSS. Patient resting comfortably. PO potassium given without issue.

## 2023-12-17 NOTE — Progress Notes (Signed)
 R fem triple lumen line placed. Patient tolerated procedure.

## 2023-12-17 NOTE — Progress Notes (Signed)
 Lab unable to draw morning labs on pt. Charge nurse also attempted to draw labs. Md made aware.

## 2023-12-18 ENCOUNTER — Inpatient Hospital Stay (HOSPITAL_COMMUNITY): Payer: Medicare Other

## 2023-12-18 DIAGNOSIS — R1310 Dysphagia, unspecified: Secondary | ICD-10-CM

## 2023-12-18 DIAGNOSIS — R197 Diarrhea, unspecified: Secondary | ICD-10-CM

## 2023-12-18 DIAGNOSIS — E876 Hypokalemia: Secondary | ICD-10-CM

## 2023-12-18 DIAGNOSIS — R1110 Vomiting, unspecified: Secondary | ICD-10-CM

## 2023-12-18 DIAGNOSIS — R1319 Other dysphagia: Secondary | ICD-10-CM

## 2023-12-18 DIAGNOSIS — K5909 Other constipation: Secondary | ICD-10-CM | POA: Diagnosis not present

## 2023-12-18 DIAGNOSIS — E878 Other disorders of electrolyte and fluid balance, not elsewhere classified: Secondary | ICD-10-CM

## 2023-12-18 DIAGNOSIS — Z8719 Personal history of other diseases of the digestive system: Secondary | ICD-10-CM

## 2023-12-18 LAB — CBC WITH DIFFERENTIAL/PLATELET
Abs Immature Granulocytes: 0.05 10*3/uL (ref 0.00–0.07)
Basophils Absolute: 0 10*3/uL (ref 0.0–0.1)
Basophils Relative: 0 %
Eosinophils Absolute: 0.1 10*3/uL (ref 0.0–0.5)
Eosinophils Relative: 1 %
HCT: 30.5 % — ABNORMAL LOW (ref 39.0–52.0)
Hemoglobin: 9.3 g/dL — ABNORMAL LOW (ref 13.0–17.0)
Immature Granulocytes: 0 %
Lymphocytes Relative: 7 %
Lymphs Abs: 0.9 10*3/uL (ref 0.7–4.0)
MCH: 21.9 pg — ABNORMAL LOW (ref 26.0–34.0)
MCHC: 30.5 g/dL (ref 30.0–36.0)
MCV: 71.9 fL — ABNORMAL LOW (ref 80.0–100.0)
Monocytes Absolute: 0.8 10*3/uL (ref 0.1–1.0)
Monocytes Relative: 6 %
Neutro Abs: 11.4 10*3/uL — ABNORMAL HIGH (ref 1.7–7.7)
Neutrophils Relative %: 86 %
Platelets: 180 10*3/uL (ref 150–400)
RBC: 4.24 MIL/uL (ref 4.22–5.81)
RDW: 23.6 % — ABNORMAL HIGH (ref 11.5–15.5)
WBC: 13.2 10*3/uL — ABNORMAL HIGH (ref 4.0–10.5)
nRBC: 0.2 % (ref 0.0–0.2)

## 2023-12-18 LAB — BASIC METABOLIC PANEL
Anion gap: 3 — ABNORMAL LOW (ref 5–15)
BUN: 16 mg/dL (ref 8–23)
CO2: 23 mmol/L (ref 22–32)
Calcium: 8.2 mg/dL — ABNORMAL LOW (ref 8.9–10.3)
Chloride: 114 mmol/L — ABNORMAL HIGH (ref 98–111)
Creatinine, Ser: 1.86 mg/dL — ABNORMAL HIGH (ref 0.61–1.24)
GFR, Estimated: 37 mL/min — ABNORMAL LOW (ref >60)
Glucose, Bld: 139 mg/dL — ABNORMAL HIGH (ref 70–99)
Potassium: 2.6 mmol/L — CL (ref 3.5–5.1)
Sodium: 140 mmol/L (ref 135–145)

## 2023-12-18 LAB — PHOSPHORUS: Phosphorus: 1.3 mg/dL — ABNORMAL LOW (ref 2.5–4.6)

## 2023-12-18 LAB — GLUCOSE, CAPILLARY
Glucose-Capillary: 104 mg/dL — ABNORMAL HIGH (ref 70–99)
Glucose-Capillary: 101 mg/dL — ABNORMAL HIGH (ref 70–99)
Glucose-Capillary: 109 mg/dL — ABNORMAL HIGH (ref 70–99)
Glucose-Capillary: 109 mg/dL — ABNORMAL HIGH (ref 70–99)

## 2023-12-18 LAB — PATHOLOGIST SMEAR REVIEW: Path Review: NORMAL

## 2023-12-18 LAB — MAGNESIUM: Magnesium: 1.7 mg/dL (ref 1.7–2.4)

## 2023-12-18 LAB — CLOSTRIDIUM DIFFICILE BY PCR, REFLEXED: Toxigenic C. Difficile by PCR: NEGATIVE

## 2023-12-18 LAB — POTASSIUM: Potassium: 3 mmol/L — ABNORMAL LOW (ref 3.5–5.1)

## 2023-12-18 MED ORDER — POTASSIUM CHLORIDE CRYS ER 20 MEQ PO TBCR
40.0000 meq | EXTENDED_RELEASE_TABLET | ORAL | Status: DC
  Administered 2023-12-18: 40 meq via ORAL
  Filled 2023-12-18: qty 2

## 2023-12-18 MED ORDER — POTASSIUM CHLORIDE 10 MEQ/100ML IV SOLN
10.0000 meq | INTRAVENOUS | Status: AC
  Administered 2023-12-18 (×6): 10 meq via INTRAVENOUS
  Filled 2023-12-18 (×6): qty 100

## 2023-12-18 MED ORDER — MAGNESIUM SULFATE 2 GM/50ML IV SOLN
2.0000 g | Freq: Once | INTRAVENOUS | Status: AC
  Administered 2023-12-18: 2 g via INTRAVENOUS
  Filled 2023-12-18: qty 50

## 2023-12-18 MED ORDER — POTASSIUM CHLORIDE 20 MEQ PO PACK
40.0000 meq | PACK | Freq: Once | ORAL | Status: AC
  Administered 2023-12-18: 40 meq via ORAL
  Filled 2023-12-18: qty 2

## 2023-12-18 MED ORDER — DIATRIZOATE MEGLUMINE & SODIUM 66-10 % PO SOLN
60.0000 mL | Freq: Once | ORAL | Status: AC
  Administered 2023-12-18: 60 mL via ORAL

## 2023-12-18 MED ORDER — DEXTROSE 5 % IV SOLN: INTRAVENOUS | Status: DC

## 2023-12-18 MED ORDER — K PHOS MONO-SOD PHOS DI & MONO 155-852-130 MG PO TABS
500.0000 mg | ORAL_TABLET | Freq: Three times a day (TID) | ORAL | Status: DC
  Administered 2023-12-18: 500 mg via ORAL
  Filled 2023-12-18: qty 2

## 2023-12-18 NOTE — NC FL2 (Signed)
 [place]  MEDICAID FL2 LEVEL OF CARE FORM     IDENTIFICATION  Patient Name: [name] Birthdate: July 09, 1946 Sex: male Admission Date (Current Location): 12/16/2023  [facility] and [place] Number:  [employer] and Address:  [facility],  [address], [place] [address]      Provider Number: [id number]  Attending Physician Name and Address:  Jillian Buttery, MD  Relative Name and Phone Number:  Graeme Menees (spouse) [phone]    Current Level of Care: Hospital Recommended Level of Care: Nursing Facility Prior Approval Number:    Date Approved/Denied:   PASRR Number: [id number]  Discharge Plan: SNF    Current Diagnoses: Patient Active Problem List   Diagnosis Date Noted   Dehydration 12/17/2023   Electrolyte and fluid disorder 12/16/2023   Gastritis and gastroduodenitis 10/19/2023   Duodenal adenoma 10/19/2023   GIB (gastrointestinal bleeding) 10/18/2023   Gastrointestinal hemorrhage with hematemesis 10/17/2023   Adenomatous duodenal polyp 08/24/2022   Coffee ground emesis 07/14/2022   MGUS (monoclonal gammopathy of unknown significance) 03/03/2021   Chronic obstructive pulmonary disease (HCC) 02/17/2021   Common variable immunodeficiency (HCC) 02/17/2021   Osteoarthrosis 02/17/2021   Diverticular disease 01/05/2021   History of cerebrovascular accident 01/05/2021   Hiatal hernia 01/05/2021   Hyperlipidemia 01/05/2021   Glaucoma 01/05/2021   Esotropia of left eye 01/05/2021   Benign prostatic hyperplasia 01/05/2021   History of radiation therapy 01/05/2021   Anemia 01/05/2021   GI bleed 03/18/2020   Acute lower GI bleeding 03/03/2020   Pressure injury of skin 03/03/2020   Thoracic aortic aneurysm without rupture (HCC) 11/19/2019   Hardening of the aorta (main artery of the heart) (HCC) 11/19/2019   Protein-calorie malnutrition (HCC) 06/10/2019   Avitaminosis D 03/07/2019   Male hypogonadism 07/26/2018   Gynecomastia 06/27/2018    Malignant neoplasm of upper lobe of left lung (HCC) 04/06/2018   History of malignant neoplasm of parotid gland 02/07/2018   Adenoma of large intestine 11/09/2016   Adult hypothyroidism 09/09/2015   Chronic constipation 05/22/2015   Impetigo bullosa 10/23/2013   Chronic left shoulder pain 09/25/2013   Muscle weakness (generalized) 09/25/2013   Hypercalcemia 08/08/2013   Chronic kidney disease, stage 3 unspecified (HCC) 08/07/2013   AKI (acute kidney injury) (HCC) 07/24/2013   Acute kidney failure, unspecified (HCC) 07/24/2013   Adhesive capsulitis of left shoulder 05/14/2013   GERD (gastroesophageal reflux disease)    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/23/2012   Epidermoid cyst 10/05/2012   Neck pain 08/26/2012   Chronic abdominal pain 08/26/2012   Edema of lower extremity 08/24/2012   Hemiplegia of dominant side as late effect of cerebrovascular disease (HCC) 06/06/2012   Diarrhea 04/17/2012   Alimentary obesity 04/17/2012   Type 2 diabetes mellitus with diabetic neuropathy, unspecified (HCC) 02/13/2012   Recurrent boils 01/12/2012   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) 07/15/2011   Hemorrhoids, internal 03/23/2011   Hepatitis 03/02/2011   Steatohepatitis 03/02/2011   Abdominal pain, right upper quadrant 12/16/2010   Abnormal levels of other serum enzymes 12/16/2010   Eosinophil count raised 12/23/2009   HEMATOCHEZIA 12/23/2009   Type 2 diabetes mellitus (HCC) 01/05/2009   Gout 01/05/2009   Hypertensive disorder 01/05/2009   BARRETTS ESOPHAGUS 01/05/2009   DIVERTICULOSIS OF COLON 01/05/2009   History of malignant lymphoma 01/05/2009    Orientation RESPIRATION BLADDER Height & Weight     Self, Place, Situation  Normal Incontinent, External catheter Weight: 212 lb 11.9 oz (96.5 kg)  Height:  6' (182.9 cm)  BEHAVIORAL SYMPTOMS/MOOD NEUROLOGICAL BOWEL NUTRITION STATUS      Incontinent Diet (See DC summary)  AMBULATORY STATUS COMMUNICATION OF NEEDS  Skin   Total Care Verbally Other (Comment) (Erythema/ Redness sacrum bilateral)                       Personal Care Assistance Level of Assistance  Bathing, Dressing, Feeding Bathing Assistance: Maximum assistance Feeding assistance: Limited assistance Dressing Assistance: Maximum assistance     Functional Limitations Info  Sight, Hearing, Speech Sight Info: Impaired Hearing Info: Adequate Speech Info: Impaired (Difficulty Speaking)    SPECIAL CARE FACTORS FREQUENCY                       Contractures Contractures Info: Not present    Additional Factors Info  Code Status, Allergies Code Status Info: DNR- Limited Allergies Info: NKA           Current Medications (12/18/2023):  This is the current hospital active medication list Current Facility-Administered Medications  Medication Dose Route Frequency Provider Last Rate Last Admin   acetaminophen  (TYLENOL ) tablet 1,000 mg  1,000 mg Oral Q6H PRN Akula, Vijaya, MD   1,000 mg at 12/17/23 0041   allopurinol  (ZYLOPRIM ) tablet 150 mg  150 mg Oral Daily Akula, Vijaya, MD   150 mg at 12/17/23 9046   aspirin  EC tablet 81 mg  81 mg Oral Daily Akula, Vijaya, MD   81 mg at 12/17/23 9045   baclofen  (LIORESAL ) tablet 5 mg  5 mg Oral TID PRN Akula, Vijaya, MD       bisacodyl  (DULCOLAX) suppository 10 mg  10 mg Rectal Daily PRN Akula, Vijaya, MD       buPROPion  ER (WELLBUTRIN  SR) 12 hr tablet 100 mg  100 mg Oral BID Akula, Vijaya, MD   100 mg at 12/17/23 2119   Chlorhexidine  Gluconate Cloth 2 % PADS 6 each  6 each Topical Daily Mavis Anes, MD   6 each at 12/18/23 0844   cinacalcet  (SENSIPAR ) tablet 30 mg  30 mg Oral Q breakfast Akula, Vijaya, MD   30 mg at 12/17/23 0954   feeding supplement (BOOST / RESOURCE BREEZE) liquid 1 Container  1 Container Oral TID BM Akula, Vijaya, MD   1 Container at 12/17/23 2049   gabapentin  (NEURONTIN ) capsule 300 mg  300 mg Oral BID Akula, Vijaya, MD   300 mg at 12/17/23 2119   heparin  injection  5,000 Units  5,000 Units Subcutaneous Q8H Akula, Vijaya, MD   5,000 Units at 12/17/23 2119   hydrALAZINE  (APRESOLINE ) tablet 25 mg  25 mg Oral Q8H PRN Akula, Vijaya, MD       insulin  aspart (novoLOG ) injection 0-6 Units  0-6 Units Subcutaneous TID WC Akula, Vijaya, MD       linaclotide  (LINZESS ) capsule 145 mcg  145 mcg Oral QAC breakfast Akula, Vijaya, MD       LORazepam  (ATIVAN ) injection 0.5 mg  0.5 mg Intravenous Q6H PRN Akula, Vijaya, MD   0.5 mg at 12/17/23 1001   magnesium  sulfate IVPB 2 g 50 mL  2 g Intravenous Once Adhikari, Amrit, MD 50 mL/hr at 12/18/23 1031 2 g at 12/18/23 1031   melatonin tablet 6 mg  6 mg Oral QHS PRN Akula, Vijaya, MD       metoprolol  tartrate (LOPRESSOR ) tablet 50 mg  50 mg Oral Daily Akula, Vijaya, MD   50 mg at 12/17/23 [phone]  oxyCODONE -acetaminophen  (PERCOCET) 7.5-325 MG per tablet 1 tablet  1 tablet Oral Q12H PRN Akula, Vijaya, MD       pantoprazole  (PROTONIX ) injection 40 mg  40 mg Intravenous Q12H Akula, Vijaya, MD   40 mg at 12/18/23 1020   phosphorus (K PHOS  NEUTRAL) tablet 500 mg  500 mg Oral TID Adhikari, Amrit, MD       polyethylene glycol (MIRALAX  / GLYCOLAX ) packet 17 g  17 g Oral Daily PRN Akula, Vijaya, MD       potassium & sodium phosphates  (PHOS-NAK) 280-160-250 MG packet 2 packet  2 packet Oral Q4H Segars, Jonathan, MD   2 packet at 12/17/23 2259   potassium chloride  10 mEq in 100 mL IVPB  10 mEq Intravenous Q1 Hr x 6 Adhikari, Amrit, MD 100 mL/hr at 12/18/23 1111 10 mEq at 12/18/23 1111   rosuvastatin  (CRESTOR ) tablet 20 mg  20 mg Oral QPM Akula, Vijaya, MD   20 mg at 12/17/23 1730   sodium chloride  flush (NS) 0.9 % injection 10-40 mL  10-40 mL Intracatheter Q12H Mavis Anes, MD   10 mL at 12/18/23 1029   sodium chloride  flush (NS) 0.9 % injection 10-40 mL  10-40 mL Intracatheter PRN Mavis Anes, MD       sodium chloride  flush (NS) 0.9 % injection 3 mL  3 mL Intravenous Q12H Akula, Vijaya, MD   3 mL at 12/17/23 2120   sucralfate  (CARAFATE ) 1  GM/10ML suspension 1 g  1 g Oral TID WC & HS Akula, Vijaya, MD   1 g at 12/17/23 2119   tamsulosin  (FLOMAX ) capsule 0.4 mg  0.4 mg Oral QPM Akula, Vijaya, MD   0.4 mg at 12/17/23 1730     Discharge Medications: Please see discharge summary for a list of discharge medications.  Relevant Imaging Results:  Relevant Lab Results:   Additional Information SSN: [id number]  Noreen KATHEE [place], [place]

## 2023-12-18 NOTE — Consult Note (Signed)
 @LOGO @   Referring Provider: [facility]  Primary Care Physician:  System, Provider Not In Primary Gastroenterologist:  Dr. Shaaron  Date of Admission: 12/16/23 Date of Consultation: 12/18/23  Reason for Consultation:  Dysphagia  HPI:  [name] is a 78 y.o. year old male with history of stroke with residual right-sided hemiparesis, hepatic steatosis, renal insufficiency, non-small cell lung cancer, lymphoma, MGUS, adenocarcinoma of the parotid gland, asthma, HTN, DM, CKD, Barrett's esophagus, EOE treated with steroids,  GERD, gastroparesis, hemorrhoids, colon polyps, chronic constipation, hemorrhagic colitis in 2013 presumed infectious, admitted in August 2023 with hematemesis found to have hemorrhagic gastric polyps and duodenal adenoma with low-grade dysplasia with recommendations to follow conservatively considering overall poor health, chronic right upper quadrant pain suspected to be neurogenic or MSK, chronic IDA, recent admission in November 2024 with upper GI bleed (hematemesis) with EGD showing grade B esophagitis, erythematous gastric mucosa, 2 x 3 cm duodenal adenoma, no H pylori.   Now presenting to the ED on 12/16/23 due to 2 weeks of inability to tolerate p.o.'s.,  Abdominal pain.  ED course: Hypertensive and tachycardic. WBC 15.7 (stable), hemoglobin 12.1 (improved), potassium <2, sodium 152, creatinine 2.44 (up from 2.33). Phosphorus 2.3 Troponin 123>> 118>> 144 BNP 135 C. difficile antigen was positive, but toxin and PCR negative.  EKG without ischemic changes.   CT A/P without contrast with no acute findings.  He did have some smooth wall thickening in the proximal sigmoid colon without adjacent inflammatory change, nonspecific.  Cardiology stated consider outpatient elective colonoscopic evaluation.  Chest x-ray with no acute findings.  Venous ultrasound with near occlusive thrombus within the contralateral left common femoral vein, presumably chronic in etiology  though new compared to 07/2018 examination.  He was started on heparin .   NM pulmonary perfusion scan was negative for PE.  He was given IV fluids.  Also started on potassium repletion.  Started on Ativan  for nausea/vomiting due to prolonged QTc.  Pantoprazole  40 mg every 12 hours and Carafate  1 g 4 times daily.  Regarding DVT, felt anticoagulation outweighed the risk and recommended shared decision making with the family.  Due to trouble with IV access, general surgery placed central line on 1/5.   Labs today: Hemoglobin 9.3, WBC 13.2, potassium 2.6, creatinine 1.86.    Consult: For the last 2 weeks, he has been having vomiting every time he ate. Was feeling like food would get stuck in his chest which is what led to vomiting. Some heartburn a couple times a week. Not sure if he vomited blood or coffee grounds. Wife didn't see any when she was there. Last vomited Wednesday as he has been refusing po intake since. Little liquid intake since then as well. Has pain radiating up to his chest from his abdomen.   Has been having diarrhea since he got to the ER. 5 documented Type 6 Bms documented in the last 24 hours.   Also reports pain in his stomach. States this has been going on for several months with no change. Never goes away. Worse with eating. Some improvement with a BM, but doesn't go away.     EGD 10/19/2023: Grade B esophagitis, erythematous gastric mucosa, 2 x 3 cm duodenal adenoma, no H pylori.   Colonoscopy attempted in 2017: Incomplete due to inadequate prep.    Last complete colonoscopy in 2012: Pancolonic diverticula, hemorrhoids, tubular adenoma.    Past Medical History:  Diagnosis Date   Arthritis    Asthma    hx  of   Barrett's esophagus    EGD 03/23/2011 & EGD 2/09 bx proven   BPH (benign prostatic hyperplasia)    Cancer of parotid gland (HCC) 11/23/12   Adenocarcinoma   Chronic abdominal pain    Chronic constipation    Colon polyp 03/23/2011   tubular adenoma,  Dr. Shaaron   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) 07/15/2011   CVA (cerebral infarction) 1998   right sided deficit   Delayed gastric emptying 2018   Diverticulosis    TCS 03/23/11 pancolonic diverticula &TCS 5/08, pancolonic diverticula   DM (diabetes mellitus) (HCC)    Edema of lower extremity 12/21/12   bilateral    Esotropia of left eye    GERD (gastroesophageal reflux disease)    Glaucoma (increased eye pressure)    Gout    Gout    Hemorrhagic colitis 06/06/2012.   Hemorrhoids, internal 03/23/2011   tcs by Dr. Shaaron   Hepatitis    esosiniphilic, tx with prednisone   Hiatal hernia    History of radiation therapy 05/21/18- 05/30/18   Left Lung/ 54 Gy delivered in 3 fractions of 18 Gy. SBRT   HTN (hypertension)    Hx of radiation therapy 1974   right base of skull area-lymphoma   Hyperlipidemia    Lower facial weakness    Right   Lymphoma (HCC) 1974   XRT at [facility], right base of skull area   Neuropathy    Non-small cell lung cancer (HCC) dx'd 04/26/18   Peripheral edema    R>L legs   Rash    chronic, recurrent, R>L legs   Renal insufficiency    Steatohepatitis    liver biopsy 2009   Stroke [facility]) 1998   right hemiparesis/plegia    Past Surgical History:  Procedure Laterality Date   BIOPSY  12/01/2016   Procedure: BIOPSY;  Surgeon: Lamar CHRISTELLA Shaaron, MD;  Location: [facility];  Service: Endoscopy;;  duodenum, gastric, esophagus   BIOPSY  07/15/2022   Procedure: BIOPSY;  Surgeon: Shaaron Lamar CHRISTELLA, MD;  Location: [facility];  Service: Endoscopy;;  duodenal, mass;   BIOPSY  10/19/2023   Procedure: BIOPSY;  Surgeon: Cinderella Deatrice FALCON, MD;  Location: [facility];  Service: Endoscopy;;   CHOLECYSTECTOMY     COLONOSCOPY  03/23/11   Dr. Shaaron  pancolonic diverticula, hemorrhoids, tubular adenoma.. next tcs 03/2016   COLONOSCOPY WITH PROPOFOL  N/A 12/01/2016   inadequate bowel prep precluded exam   ESOPHAGOGASTRODUODENOSCOPY  02/05/08   goblet cell  metaplasia/negative for H.pylori   ESOPHAGOGASTRODUODENOSCOPY  03/23/11   Dr. Shaaron, barretts, hiatal hernia   ESOPHAGOGASTRODUODENOSCOPY (EGD) WITH PROPOFOL  N/A 12/01/2016   Dr. Shaaron: Large amount of retained gastric contents precluded completion of the stomach. Mucosal changes were found in the stomach. Erosions and somewhat scalloped appearing mucosa present, reactive gastritis/no H pylori. Barrett's esophagus noted, no dysplasia on biopsy. Duodenal biopsies taken as well, benign, no evidence of eosinophilia.   ESOPHAGOGASTRODUODENOSCOPY (EGD) WITH PROPOFOL  N/A 07/15/2022   Procedure: ESOPHAGOGASTRODUODENOSCOPY (EGD) WITH PROPOFOL ;  Surgeon: Shaaron Lamar CHRISTELLA, MD;  [facility];  Service: Endoscopy;  Laterality: N/A;   ESOPHAGOGASTRODUODENOSCOPY (EGD) WITH PROPOFOL  N/A 10/19/2023   Procedure: ESOPHAGOGASTRODUODENOSCOPY (EGD) WITH PROPOFOL ;  Surgeon: Cinderella Deatrice FALCON, MD;  [facility]: [facility];  Service: Endoscopy;  Laterality: N/A;   IR FLUORO GUIDED NEEDLE PLC ASPIRATION/INJECTION LOC  12/13/2018   IR KYPHO LUMBAR INC FX REDUCE BONE BX UNI/BIL CANNULATION INC/IMAGING  12/13/2018  IR RADIOLOGY PERIPHERAL GUIDED IV START  12/13/2018   IR US  GUIDE VASC ACCESS LEFT  12/13/2018   MASS BIOPSY  11/01/2012   Procedure: NECK MASS BIOPSY;  Surgeon: Ana LELON Moccasin, MD;  Location: [facility];  Service: ENT;  Laterality: Right;  Excisional Bx Right Neck Mass; attempted external jugular cutdown of left side   PAROTIDECTOMY  11/24/2012   Procedure: PAROTIDECTOMY;  Surgeon: Ana LELON Moccasin, MD;  [facility] OR;  Service: ENT;  Laterality: N/A;  Total parotidectomy   PLEURECTOMY     right lymph node removal Right    behind right ear   Right video-assisted thoracic surgery, pleurectomy, and pleurodesis  2011   VIDEO BRONCHOSCOPY WITH ENDOBRONCHIAL NAVIGATION N/A 04/26/2018   Procedure: VIDEO BRONCHOSCOPY WITH ENDOBRONCHIAL NAVIGATION;  Surgeon: Kerrin Elspeth BROCKS, MD;  Location: [facility] OR;  Service: Thoracic;   Laterality: N/A;    Prior to Admission medications   Medication Sig Start Date End Date Taking? Authorizing Provider  acetaminophen  (TYLENOL ) 650 MG CR tablet Take 650 mg by mouth every 4 (four) hours as needed for pain.   Yes [provider]  allopurinol  (ZYLOPRIM ) 300 MG tablet TAKE 1 TABLET(300 MG) BY MOUTH DAILY Patient taking differently: Take 300 mg by mouth daily. TAKE 1 TABLET(300 MG) BY MOUTH DAILY 12/10/21  Yes Duanne Butler DASEN, MD  amLODipine  (NORVASC ) 10 MG tablet Take 1 tablet (10 mg total) by mouth daily. 12/10/21  Yes Duanne Butler DASEN, MD  ASPIRIN  ADULT LOW STRENGTH 81 MG tablet Take 1 tablet (81 mg total) by mouth daily. Restart in 1 week 10/23/23  Yes Shahmehdi, Seyed A, MD  b complex-vitamin c-folic acid (NEPHRO-VITE) 0.8 MG TABS tablet Take 1 tablet by mouth at bedtime.   Yes [provider]  Baclofen  5 MG TABS Take 1 tablet by mouth 3 (three) times daily. 08/22/22  Yes [provider]  bisacodyl  (DULCOLAX) 10 MG suppository Place 10 mg rectally daily as needed for moderate constipation.   Yes [provider]  buPROPion  ER (WELLBUTRIN  SR) 100 MG 12 hr tablet Take 100 mg by mouth 2 (two) times daily. 02/03/22  Yes [provider]  cinacalcet  (SENSIPAR ) 30 MG tablet Take 30 mg by mouth daily with breakfast. 02/03/22  Yes [provider]  cyanocobalamin  (VITAMIN B12) 1000 MCG tablet Take 1 tablet (1,000 mcg total) by mouth daily. 07/17/22  Yes Antoinette Doe, MD  doxycycline  (VIBRA -TABS) 100 MG tablet Take 100 mg by mouth 2 (two) times daily. 7 day course starting on 12/11/2023 12/11/23  Yes [provider]  furosemide  (LASIX ) 20 MG tablet Take 1 tablet (20 mg total) by mouth daily. 10/23/23  Yes Shahmehdi, Adriana LABOR, MD  gabapentin  (NEURONTIN ) 600 MG tablet Take 600 mg by mouth 2 (two) times daily. 11/16/23  Yes [provider]  insulin  glargine (LANTUS  SOLOSTAR) 100 UNIT/ML Solostar Pen Inject 30 Units into the  skin at bedtime. 03/04/21  Yes Reardon, Benton PARAS, NP  ipratropium-albuterol  (DUONEB) 0.5-2.5 (3) MG/3ML SOLN Take 3 mLs by nebulization every 6 (six) hours.   Yes [provider]  iron  polysaccharides (NIFEREX) 150 MG capsule TAKE 1 CAPSULE(150 MG) BY MOUTH DAILY Patient taking differently: Take 150 mg by mouth daily. 07/07/21  Yes Duanne Butler DASEN, MD  metoprolol  tartrate (LOPRESSOR ) 50 MG tablet Take 50 mg by mouth daily. 12/10/21  Yes [provider]  ondansetron  (ZOFRAN -ODT) 4 MG disintegrating tablet Take 4 mg by mouth every 6 (six) hours as needed for nausea or vomiting.  Yes [provider]  oxyCODONE -acetaminophen  (PERCOCET) 7.5-325 MG tablet Take 1 tablet by mouth in the morning and at bedtime.   Yes [provider]  pantoprazole  (PROTONIX ) 40 MG tablet Take 1 tablet (40 mg total) by mouth 2 (two) times daily. 10/20/23  Yes Shahmehdi, Seyed A, MD  polyethylene glycol powder (MIRALAX ) 17 GM/SCOOP powder Take one capful daily. Hold for diarrhea. Patient taking differently: Take 17 g by mouth daily. Hold for diarrhea. 08/24/22  Yes Ezzard Sonny RAMAN, PA-C  Potassium Chloride  ER 20 MEQ TBCR Take 1 tablet by mouth 2 (two) times daily. 12/08/23  Yes [provider]  Prucalopride Succinate  (MOTEGRITY ) 2 MG TABS Take 1 tablet (2 mg total) by mouth daily. 11/22/22  Yes Rourk, Lamar HERO, MD  rosuvastatin  (CRESTOR ) 20 MG tablet Take 20 mg by mouth daily.   Yes [provider]  tamsulosin  (FLOMAX ) 0.4 MG CAPS capsule TAKE 1 CAPSULE(0.4 MG) BY MOUTH DAILY Patient taking differently: Take 0.4 mg by mouth daily. 12/10/21  Yes Duanne Butler DASEN, MD  traZODone  (DESYREL ) 100 MG tablet Take 100 mg by mouth at bedtime. 03/07/22  Yes [provider]  insulin  lispro (HUMALOG  KWIKPEN) 100 UNIT/ML KwikPen Per sliding scale CBG 70 - 120:   0 units CBG 121 -150: 1 units  CBG 151 - 200: 2 unit  CBG 201-250:   4 units  CBG 251-300:   6 units  CBG 301-350:   8 units  CBG 351-400: 10 units  > 450 call MD Patient not taking: Reported on 12/16/2023 10/20/23   Willette Adriana LABOR, MD    Current Facility-Administered Medications  Medication Dose Route Frequency Provider Last Rate Last Admin   acetaminophen  (TYLENOL ) tablet 1,000 mg  1,000 mg Oral Q6H PRN Akula, Vijaya, MD   1,000 mg at 12/17/23 0041   allopurinol  (ZYLOPRIM ) tablet 150 mg  150 mg Oral Daily Akula, Vijaya, MD   150 mg at 12/17/23 9046   aspirin  EC tablet 81 mg  81 mg Oral Daily Akula, Vijaya, MD   81 mg at 12/17/23 9045   baclofen  (LIORESAL ) tablet 5 mg  5 mg Oral TID PRN Akula, Vijaya, MD       bisacodyl  (DULCOLAX) suppository 10 mg  10 mg Rectal Daily PRN Akula, Vijaya, MD       buPROPion  ER (WELLBUTRIN  SR) 12 hr tablet 100 mg  100 mg Oral BID Akula, Vijaya, MD   100 mg at 12/17/23 2119   Chlorhexidine  Gluconate Cloth 2 % PADS 6 each  6 each Topical Daily Mavis Anes, MD   6 each at 12/18/23 0844   cinacalcet  (SENSIPAR ) tablet 30 mg  30 mg Oral Q breakfast Akula, Vijaya, MD   30 mg at 12/17/23 9045   diatrizoate  meglumine -sodium (GASTROGRAFIN ) 66-10 % solution 60 mL  60 mL Oral Once Jeanni Allshouse S, PA-C       feeding supplement (BOOST / RESOURCE BREEZE) liquid 1 Container  1 Container Oral TID BM Akula, Vijaya, MD   1 Container at 12/17/23 2049   gabapentin  (NEURONTIN ) capsule 300 mg  300 mg Oral BID Akula, Vijaya, MD   300 mg at 12/17/23 2119   heparin  injection 5,000 Units  5,000 Units Subcutaneous Q8H Akula, Vijaya, MD   5,000 Units at 12/17/23 2119   hydrALAZINE  (APRESOLINE ) tablet 25 mg  25 mg Oral Q8H PRN Akula, Vijaya, MD       insulin  aspart (novoLOG ) injection 0-6 Units  0-6 Units Subcutaneous TID WC Akula, Vijaya, MD  linaclotide  (LINZESS ) capsule 145 mcg  145 mcg Oral QAC breakfast Cherlyn Labella, MD       LORazepam  (ATIVAN ) injection 0.5 mg  0.5 mg Intravenous Q6H PRN Akula, Vijaya, MD   0.5 mg at 12/17/23 1001   melatonin tablet 6 mg  6 mg Oral QHS PRN Cherlyn Labella,  MD       metoprolol  tartrate (LOPRESSOR ) tablet 50 mg  50 mg Oral Daily Akula, Vijaya, MD   50 mg at 12/17/23 9046   oxyCODONE -acetaminophen  (PERCOCET) 7.5-325 MG per tablet 1 tablet  1 tablet Oral Q12H PRN Cherlyn Labella, MD       pantoprazole  (PROTONIX ) injection 40 mg  40 mg Intravenous Q12H Cherlyn Labella, MD   40 mg at 12/18/23 1020   phosphorus (K PHOS  NEUTRAL) tablet 500 mg  500 mg Oral TID Jillian Buttery, MD       polyethylene glycol (MIRALAX  / GLYCOLAX ) packet 17 g  17 g Oral Daily PRN Cherlyn Labella, MD       potassium chloride  10 mEq in 100 mL IVPB  10 mEq Intravenous Q1 Hr x 6 Adhikari, Amrit, MD 100 mL/hr at 12/18/23 1311 10 mEq at 12/18/23 1311   rosuvastatin  (CRESTOR ) tablet 20 mg  20 mg Oral QPM Cherlyn Labella, MD   20 mg at 12/17/23 1730   sodium chloride  flush (NS) 0.9 % injection 10-40 mL  10-40 mL Intracatheter Q12H Mavis Anes, MD   10 mL at 12/18/23 1029   sodium chloride  flush (NS) 0.9 % injection 10-40 mL  10-40 mL Intracatheter PRN Mavis Anes, MD       sodium chloride  flush (NS) 0.9 % injection 3 mL  3 mL Intravenous Q12H Cherlyn Labella, MD   3 mL at 12/17/23 2120   sucralfate  (CARAFATE ) 1 GM/10ML suspension 1 g  1 g Oral TID WC & HS Akula, Vijaya, MD   1 g at 12/17/23 2119   tamsulosin  (FLOMAX ) capsule 0.4 mg  0.4 mg Oral QPM Cherlyn Labella, MD   0.4 mg at 12/17/23 1730    Allergies as of 12/16/2023   (No Known Allergies)    Family History  Problem Relation Age of Onset   Heart failure Mother    Heart failure Father    Heart failure Sister    Heart failure Son    Colon cancer Neg Hx     Social History   Socioeconomic History   Marital status: Married    Spouse name: Not on file   Number of children: 1   Years of education: Not on file   Highest education level: Not on file  Occupational History   Occupation: disabled  Tobacco Use   Smoking status: Former    Current packs/day: 0.00    Average packs/day: 3.0 packs/day for 25.0 years (75.0 ttl pk-yrs)     Types: Cigarettes    Start date: 03/01/1972    Quit date: 03/01/1997    Years since quitting: 26.8   Smokeless tobacco: Never  Vaping Use   Vaping status: Never Used  Substance and Sexual Activity   Alcohol use: No   Drug use: No   Sexual activity: Not Currently  Other Topics Concern   Not on file  Social History Narrative   Not on file   Social Drivers of Health   Financial Resource Strain: Low Risk  (05/29/2020)   Overall Financial Resource Strain (CARDIA)    Difficulty of Paying Living Expenses: Not very hard  Food Insecurity: No Food Insecurity (12/16/2023)  Hunger Vital Sign    Worried About Running Out of Food in the Last Year: Never true    Ran Out of Food in the Last Year: Never true  Transportation Needs: No Transportation Needs (12/16/2023)   PRAPARE - Administrator, Civil Service (Medical): No    Lack of Transportation (Non-Medical): No  Physical Activity: Not on file  Stress: Not on file  Social Connections: Patient Declined (12/17/2023)   Social Connection and Isolation Panel [NHANES]    Frequency of Communication with Friends and Family: Patient declined    Frequency of Social Gatherings with Friends and Family: Patient declined    Attends Religious Services: Patient declined    Database Administrator or Organizations: Patient declined    Attends Banker Meetings: Patient declined    Marital Status: Patient declined  Intimate Partner Violence: Not At Risk (12/16/2023)   Humiliation, Afraid, Rape, and Kick questionnaire    Fear of Current or Ex-Partner: No    Emotionally Abused: No    Physically Abused: No    Sexually Abused: No    Review of Systems: Gen: Denies fever, chills, cold or flulike symptoms. CV: Reports chest pain that is radiating from his abdomen. No palpitations.  Resp: Denies shortness of breath.  GI: See HPI GU : Denies urinary burning, urinary frequency, urinary incontinence.  MS: Denies joint pain.  Heme: Denies  bruising, bleeding.   Physical Exam: Vital signs in last 24 hours: Temp:  [98 F (36.7 C)-98.8 F (37.1 C)] 98 F (36.7 C) (01/06 9391) Pulse Rate:  [89-94] 94 (01/06 0608) Resp:  [16-18] 18 (01/06 0608) BP: (149-169)/(88-97) 149/88 (01/06 0608) SpO2:  [100 %] 100 % (01/06 9391) Weight:  [96.5 kg] 96.5 kg (01/06 0600) Last BM Date : 12/17/23 General:   Alert, chronically ill appearing, resting comfortably in bed.  Right sided paralysis.  Head:  Normocephalic and atraumatic. Eyes:  Sclera clear, no icterus.   Ears:  Normal auditory acuity. Lungs:  Clear throughout to auscultation anteriorly.   No wheezes, crackles, or rhonchi. No acute distress. Heart:  Regular rate and rhythm; no murmurs, clicks, rubs,  or gallops. Abdomen:  Full, soft, generalized tenderness, greatest in RUQ.  No masses or hernias noted. Normal bowel sounds, without guarding, and without rebound.   Rectal:  Deferred  Msk:  Symmetrical without gross deformities. Normal posture. Extremities:  Without bilateral pitting edema, right greater than left.  Neurologic:  Alert and seems oriented, but speech abnormality following stoke limits orientation assessment.  Skin:  Intact without significant lesions or rashes. Psych:  Normal mood and affect.  Intake/Output from previous day: 01/05 0701 - 01/06 0700 In: 1224.4 [P.O.:500; I.V.:371.4; IV Piggyback:353] Out: 1000 [Urine:1000] Intake/Output this shift: No intake/output data recorded.  Lab Results: Recent Labs    12/16/23 1355 12/17/23 2210 12/18/23 0828  WBC 15.7* 14.3* 13.2*  HGB 12.1* 10.4* 9.3*  HCT 41.0 34.3* 30.5*  PLT 256 215 180   BMET Recent Labs    12/16/23 1851 12/17/23 1253 12/17/23 2209 12/18/23 0828  NA 149* 149*  --  140  K 3.2* 2.3* 2.5* 2.6*  CL 120* 119*  --  114*  CO2 16* 22  --  23  GLUCOSE 100* 91  --  139*  BUN 22 20  --  16  CREATININE 2.28* 2.10*  --  1.86*  CALCIUM  8.7* 9.0  --  8.2*   LFT Recent Labs    12/16/23 1355   PROT  7.3  ALBUMIN  3.1*  AST 22  ALT 14  ALKPHOS 77  BILITOT 0.7   C-Diff Recent Labs    12/17/23 1516  CDIFFTOX NEGATIVE    Studies/Results: NM Pulmonary Perfusion Result Date: 12/16/2023 CLINICAL DATA:  Difficulty breathing EXAM: NUCLEAR MEDICINE PERFUSION LUNG SCAN TECHNIQUE: Perfusion images were obtained in multiple projections after intravenous injection of radiopharmaceutical. Ventilation scans intentionally deferred if perfusion scan and chest x-ray adequate for interpretation during COVID 19 epidemic. RADIOPHARMACEUTICALS:  4.2 mCi Tc-50m MAA IV COMPARISON:  Chest x-ray from earlier in the same day. FINDINGS: Adequate uptake is noted throughout both lungs. No focal defect to suggest pulmonary embolism is noted. IMPRESSION: No evidence of pulmonary embolus. Electronically Signed   By: Oneil Devonshire M.D.   On: 12/16/2023 18:18   US  Venous Img Lower Right (DVT Study) Result Date: 12/16/2023 CLINICAL DATA:  Right lower extremity edema.  Evaluate for DVT. EXAM: RIGHT LOWER EXTREMITY VENOUS DOPPLER ULTRASOUND TECHNIQUE: Gray-scale sonography with graded compression, as well as color Doppler and duplex ultrasound were performed to evaluate the lower extremity deep venous systems from the level of the common femoral vein and including the common femoral, femoral, profunda femoral, popliteal and calf veins including the posterior tibial, peroneal and gastrocnemius veins when visible. The superficial great saphenous vein was also interrogated. Spectral Doppler was utilized to evaluate flow at rest and with distal augmentation maneuvers in the common femoral, femoral and popliteal veins. COMPARISON:  Right lower extremity venous Doppler ultrasound-07/27/2018 FINDINGS: Contralateral Common Femoral Vein: There is mixed echogenic near occlusive thrombus within the contralateral left common femoral vein (images 3 through 6). Common Femoral Vein: No evidence of thrombus. Normal compressibility, respiratory  phasicity and response to augmentation. Saphenofemoral Junction: No evidence of thrombus. Normal compressibility and flow on color Doppler imaging. Profunda Femoral Vein: No evidence of thrombus. Normal compressibility and flow on color Doppler imaging. Femoral Vein: No evidence of thrombus. Normal compressibility, respiratory phasicity and response to augmentation. Popliteal Vein: No evidence of thrombus. Normal compressibility, respiratory phasicity and response to augmentation. Calf Veins: Appear patent where visualized Superficial Great Saphenous Vein: Appears patent where visualized Other Findings: There is a moderate amount of subcutaneous edema at the level of the right lower leg. IMPRESSION: 1. No definite evidence of DVT within right lower extremity. 2. Near occlusive thrombus within the contralateral left common femoral vein, presumably chronic in etiology though new compared to the 07/2018 examination. Clinical correlation is advised. Electronically Signed   By: Norleen Roulette M.D.   On: 12/16/2023 16:28   DG Chest Portable 1 View Result Date: 12/16/2023 CLINICAL DATA:  Abdominal pain, edema EXAM: PORTABLE CHEST - 1 VIEW COMPARISON:  CT 05/10/2023, and previous FINDINGS: Left apical bullous change with an area of bandlike consolidation in the upper lobe which is stable from prior study. No new infiltrate or overt edema. Heart size and mediastinal contours are within normal limits. No effusion. Advanced bilateral glenohumeral DJD. IMPRESSION: 1. No acute findings. 2. Left apical bullous change with stable bandlike consolidation. Electronically Signed   By: JONETTA Faes M.D.   On: 12/16/2023 14:56   CT ABDOMEN PELVIS WO CONTRAST Result Date: 12/16/2023 CLINICAL DATA:  Acute abdominal pain, history of gastric carcinoma EXAM: CT ABDOMEN AND PELVIS WITHOUT CONTRAST TECHNIQUE: Multidetector CT imaging of the abdomen and pelvis was performed following the standard protocol without IV contrast. RADIATION DOSE  REDUCTION: This exam was performed according to the departmental dose-optimization program which includes automated exposure control, adjustment of the  mA and/or kV according to patient size and/or use of iterative reconstruction technique. COMPARISON:  10/17/2023 FINDINGS: Lower chest: No pleural or pericardial effusion. Scattered coronary and aortic calcifications. Small hiatal hernia. Hepatobiliary: No focal liver abnormality is seen. Status post cholecystectomy. No biliary dilatation. Pancreas: Unremarkable. No pancreatic ductal dilatation or surrounding inflammatory changes. Spleen: Normal in size without focal abnormality. Adrenals/Urinary Tract: No adrenal mass. No urolithiasis or hydronephrosis. Multiple cortical lesions in both kidneys, some of which can be characterized as cysts, largest 4.9 cm 13 HU, left upper pole; no followup recommended. Urinary bladder partially distended. Stomach/Bowel: Small hiatal hernia. Stomach is nondistended, with old gastrostomy tract. Small bowel is decompressed. Normal appendix. The colon is partially distended, with multiple scattered diverticula. Segmental wall thickening is suspected in the proximal sigmoid colon, without adjacent inflammatory change. Vascular/Lymphatic: Moderate aortoiliac calcified atheromatous plaque without aneurysm. No abdominal or pelvic adenopathy. Reproductive: Prostate enlargement. Other: Right pelvic phleboliths.  No ascites.  No free air. Musculoskeletal: Post cement vertebral augmentation L2. Stable spondylitic changes L3-S1. IMPRESSION: 1. No acute findings. 2. Colonic diverticulosis. 3. Small hiatal hernia. 4. Smooth wall thickening in the proximal sigmoid colon without adjacent inflammatory change, nonspecific. Consider outpatient elective colonoscopic evaluation. 5.  Aortic Atherosclerosis (ICD10-I70.0). Electronically Signed   By: JONETTA Faes M.D.   On: 12/16/2023 14:51    Impression: 78 y.o. year old male with history of stroke with  residual right-sided hemiparesis, hepatic steatosis, renal insufficiency, non-small cell lung cancer, adenocarcinoma of the parotid gland, MGUS with possible conversion to multiple myeloma, not felt to be a candidate for additional treatment, asthma, HTN, DM, CKD, Barrett's esophagus, EOE, GERD, gastroparesis, hemorrhoids, colon polyps, chronic constipation, hemorrhagic colitis in 2013 presumed infectious, chronic right upper quadrant pain suspected to be neurogenic or MSK, chronic IDA, admitted in August 2023 with hematemesis found to have hemorrhagic gastric polyps and duodenal adenoma with low-grade dysplasia with recommendations to follow conservatively considering overall poor health, recent admission in November 2024 with upper GI bleed (hematemesis) with EGD showing grade B esophagitis, erythematous gastric mucosa, 2 x 3 cm duodenal adenoma, no H pylori, now admitted with poor po intake x 2 weeks in the setting of dysphagia, associated severe electrolyte disturbances and dehydration.   Dysphagia/vomiting:  New onset dysphagia for the last 2 weeks with associated vomiting as patient feels items are not passing through his esophagus.  Now refusing p.o. intake.  Etiology is unclear.  Recent EGD on file 10/19/2023 with grade B esophagitis, erythematous gastric mucosa, 2 x 3 cm duodenal adenoma.  Would not think that he has suddenly developed a severe stricture though I am not able to rule this out.  Additionally, could have flare of EOE.  Gastroparesis could also be contributing to his vomiting.  Had consider repeating an EGD for further evaluation, but considering persistent, severe hypokalemia, unable to pursue this today.  Will go ahead and pursue barium pill esophagram which will help evaluate for any obvious narrowing or stricturing as well as underlying motility disorders.   Abdominal pain:  Chronic. Reports primarily right sided abdominal pain that has been present for months without change though he  has mild generalized TTP, greatest in RUQ.  CT A/P without contrast in November 2024 for abdominal pain with no acute findings though he had significant retained stool.  CT A/P without contrast completed 1/4 again with no acute findings.  He did have smooth wall thickening in the proximal sigmoid colon without adjacent inflammatory change, nonspecific with recommendations to consider  elective outpatient colonoscopy. However, I do not think patient is a good candidate for colonoscopy in light of multiple morbidities. Additionally, if this is cancer, treatment is likely not an option considering his frailty.   Diarrhea:  Etiology unclear. Could be overflow as he has chronic constipation, but this is an acute change. CT with smooth wall thickening in the proximal sigmoid colon without adjacent inflammatory change. Will obtain GI pathogen panel. C diff already completed and negative.   Electrolyte abnormalities:  Management per hospitalist.  Potassium remains low at 2.6 and phosphorus is low at 1.3 today.  GI losses likely influencing.    Plan: BPE Can consider repeat EGD pending clinical course and BPE findings.  GI pathogen panel Electrolyte replacement per hospitalist  Needs palliative consult to discuss goals of care.    LOS: 2 days    12/18/2023, 1:54 PM   Josette [facility], [place] [facility]

## 2023-12-18 NOTE — Progress Notes (Signed)
 OT Cancellation Note  Patient Details Name: [name] MRN: [id number] DOB: [birth year]   Cancelled Treatment:    Reason Eval/Treat Not Completed: OT screened, no needs identified, will sign off. Pt is a long term resident at a SNF, bed bound at baseline, with total assist for all functional mobility and ADL's. He has no skilled OT needs at this time and will be discharged from acute OT. Thank you for the referral.   Valentin Nightingale, OTR/L [facility] Marlaine Arey Elane Nightingale 12/18/2023, 9:58 AM

## 2023-12-18 NOTE — Plan of Care (Signed)
  Problem: Education: Goal: Knowledge of General Education information will improve Description: Including pain rating scale, medication(s)/side effects and non-pharmacologic comfort measures Outcome: Not Progressing   Problem: Health Behavior/Discharge Planning: Goal: Ability to manage health-related needs will improve Outcome: Not Progressing   Problem: Activity: Goal: Risk for activity intolerance will decrease Outcome: Not Progressing   Problem: Clinical Measurements: Goal: Respiratory complications will improve Outcome: Not Applicable

## 2023-12-18 NOTE — Plan of Care (Signed)
  Problem: Education: Goal: Knowledge of General Education information will improve Description: Including pain rating scale, medication(s)/side effects and non-pharmacologic comfort measures Outcome: Progressing   Problem: Health Behavior/Discharge Planning: Goal: Ability to manage health-related needs will improve Outcome: Progressing   Problem: Clinical Measurements: Goal: Ability to maintain clinical measurements within normal limits will improve Outcome: Progressing Goal: Will remain free from infection Outcome: Progressing Goal: Diagnostic test results will improve Outcome: Progressing Goal: Cardiovascular complication will be avoided Outcome: Progressing   Problem: Activity: Goal: Risk for activity intolerance will decrease Outcome: Progressing   Problem: Nutrition: Goal: Adequate nutrition will be maintained Outcome: Progressing   Problem: Coping: Goal: Level of anxiety will decrease Outcome: Progressing   Problem: Elimination: Goal: Will not experience complications related to bowel motility Outcome: Progressing Goal: Will not experience complications related to urinary retention Outcome: Progressing   Problem: Pain Management: Goal: General experience of comfort will improve Outcome: Progressing   Problem: Safety: Goal: Ability to remain free from injury will improve Outcome: Progressing   Problem: Skin Integrity: Goal: Risk for impaired skin integrity will decrease Outcome: Progressing   Problem: Education: Goal: Ability to describe self-care measures that may prevent or decrease complications (Diabetes Survival Skills Education) will improve Outcome: Progressing Goal: Individualized Educational Video(s) Outcome: Progressing   Problem: Coping: Goal: Ability to adjust to condition or change in health will improve Outcome: Progressing   Problem: Fluid Volume: Goal: Ability to maintain a balanced intake and output will improve Outcome:  Progressing   Problem: Health Behavior/Discharge Planning: Goal: Ability to identify and utilize available resources and services will improve Outcome: Progressing Goal: Ability to manage health-related needs will improve Outcome: Progressing   Problem: Metabolic: Goal: Ability to maintain appropriate glucose levels will improve Outcome: Progressing   Problem: Nutritional: Goal: Maintenance of adequate nutrition will improve Outcome: Progressing Goal: Progress toward achieving an optimal weight will improve Outcome: Progressing   Problem: Skin Integrity: Goal: Risk for impaired skin integrity will decrease Outcome: Progressing   Problem: Tissue Perfusion: Goal: Adequacy of tissue perfusion will improve Outcome: Progressing

## 2023-12-18 NOTE — TOC Initial Note (Signed)
 Transition of Care [facility]) - Initial/Assessment Note    Patient Details  Name: [name] MRN: [id number] Date of Birth: 06-04-1946  [facility]) CM/SW Contact:    Noreen KATHEE Cleotilde ISRAEL Phone Number: 12/18/2023, 10:42 AM  Clinical Narrative:                 Patient was admitted for electrolyte and fluid disorder. CSW assessed patient with spouse who is at bedside. Patient is a long-term resident with [facility]. Spouse stated that patient is paralyzed and has been living at [facility] for a 1 1/2 years. Patient DC plans are to return back to [facility] in 2-3 days. CSW did speak with Debbie at [facility] and asked her if a administrative team member could speak with spouse because she expressed some concerns and questions. CSW will complete FL2 and send notes over to [facility]. CSW will continue to follow.     Expected Discharge Plan: Skilled Nursing Facility [facility]) Barriers to Discharge: Continued Medical Work up   Patient Goals and CMS Choice Patient states their goals for this hospitalization and ongoing recovery are:: return back to SNF CMS Medicare.gov Compare Post Acute Care list provided to:: Patient Represenative (must comment) (Spouse : Maryelizabeth Fuss) Choice offered to / list presented to : Spouse, HC POA / Guardian      Expected Discharge Plan and Services In-house Referral: Clinical Social Work Discharge Planning Services: CM Consult Post Acute Care Choice: Durable Medical Equipment, Nursing Home Living arrangements for the past 2 months: Skilled Nursing Facility                                      Prior Living Arrangements/Services Living arrangements for the past 2 months: Skilled Nursing Facility Lives with:: Facility Resident Patient language and need for interpreter reviewed:: Yes        Need for Family Participation in Patient Care: Yes (Comment) Care giver support system in place?: Yes (comment) Current home services: DME, Other (comment) (24/7 care in  SNF) Criminal Activity/Legal Involvement Pertinent to Current Situation/Hospitalization: No - Comment as needed  Activities of Daily Living   ADL Screening (condition at time of admission) Independently performs ADLs?: No Does the patient have a NEW difficulty with bathing/dressing/toileting/self-feeding that is expected to last >3 days?: Yes (Initiates electronic notice to provider for possible OT consult) Does the patient have a NEW difficulty with getting in/out of bed, walking, or climbing stairs that is expected to last >3 days?: Yes (Initiates electronic notice to provider for possible PT consult) Does the patient have a NEW difficulty with communication that is expected to last >3 days?: Yes (Initiates electronic notice to provider for possible SLP consult) Is the patient deaf or have difficulty hearing?: No Does the patient have difficulty seeing, even when wearing glasses/contacts?: No Does the patient have difficulty concentrating, remembering, or making decisions?: No  Permission Sought/Granted      Share Information with NAME: Mardy Lucier     Permission granted to share info w Relationship: Spouse     Emotional Assessment Appearance:: Appears stated age Attitude/Demeanor/Rapport: Engaged Affect (typically observed): Appropriate Orientation: : Oriented to Self, Oriented to Place, Oriented to Situation Alcohol / Substance Use: Not Applicable Psych Involvement: No (comment)  Admission diagnosis:  Dehydration [E86.0] Hypokalemia [E87.6] Electrolyte and fluid disorder [E87.8] Deep vein thrombosis (DVT) of other vein of left lower extremity, unspecified chronicity (HCC) [I82.492] Vomiting,  unspecified vomiting type, unspecified whether nausea present [R11.10] Patient Active Problem List   Diagnosis Date Noted   Dehydration 12/17/2023   Electrolyte and fluid disorder 12/16/2023   Gastritis and gastroduodenitis 10/19/2023   Duodenal adenoma 10/19/2023   GIB  (gastrointestinal bleeding) 10/18/2023   Gastrointestinal hemorrhage with hematemesis 10/17/2023   Adenomatous duodenal polyp 08/24/2022   Coffee ground emesis 07/14/2022   MGUS (monoclonal gammopathy of unknown significance) 03/03/2021   Chronic obstructive pulmonary disease (HCC) 02/17/2021   Common variable immunodeficiency (HCC) 02/17/2021   Osteoarthrosis 02/17/2021   Diverticular disease 01/05/2021   History of cerebrovascular accident 01/05/2021   Hiatal hernia 01/05/2021   Hyperlipidemia 01/05/2021   Glaucoma 01/05/2021   Esotropia of left eye 01/05/2021   Benign prostatic hyperplasia 01/05/2021   History of radiation therapy 01/05/2021   Anemia 01/05/2021   GI bleed 03/18/2020   Acute lower GI bleeding 03/03/2020   Pressure injury of skin 03/03/2020   Thoracic aortic aneurysm without rupture (HCC) 11/19/2019   Hardening of the aorta (main artery of the heart) (HCC) 11/19/2019   Protein-calorie malnutrition (HCC) 06/10/2019   Avitaminosis D 03/07/2019   Male hypogonadism 07/26/2018   Gynecomastia 06/27/2018   Malignant neoplasm of upper lobe of left lung (HCC) 04/06/2018   History of malignant neoplasm of parotid gland 02/07/2018   Adenoma of large intestine 11/09/2016   Adult hypothyroidism 09/09/2015   Chronic constipation 05/22/2015   Impetigo bullosa 10/23/2013   Chronic left shoulder pain 09/25/2013   Muscle weakness (generalized) 09/25/2013   Hypercalcemia 08/08/2013   Chronic kidney disease, stage 3 unspecified (HCC) 08/07/2013   AKI (acute kidney injury) (HCC) 07/24/2013   Acute kidney failure, unspecified (HCC) 07/24/2013   Adhesive capsulitis of left shoulder 05/14/2013   GERD (gastroesophageal reflux disease)    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/23/2012   Epidermoid cyst 10/05/2012   Neck pain 08/26/2012   Chronic abdominal pain 08/26/2012   Edema of lower extremity 08/24/2012   Hemiplegia of dominant side as late effect of cerebrovascular disease  (HCC) 06/06/2012   Diarrhea 04/17/2012   Alimentary obesity 04/17/2012   Type 2 diabetes mellitus with diabetic neuropathy, unspecified (HCC) 02/13/2012   Recurrent boils 01/12/2012   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 [place] [address] Phone: [phone] Fax: [phone]  [facility] - [place], [place] - [address] [place] [address] Phone: [phone] Fax: [phone]     Social Drivers of Health (SDOH) Social History: SDOH Screenings   Food Insecurity: No Food Insecurity (12/16/2023)  Housing: Unknown (12/17/2023)  Transportation Needs: No Transportation Needs (12/16/2023)  Utilities: Not At Risk (12/16/2023)  Alcohol Screen: Low Risk  (01/26/2021)  Depression (PHQ2-9): Low Risk  (09/21/2021)  Financial Resource Strain: Low Risk  (05/29/2020)  Social Connections: Patient Declined (12/17/2023)  Tobacco Use: Medium  Risk (12/16/2023)   SDOH Interventions:     Readmission Risk Interventions    12/18/2023   10:38 AM 07/15/2022    2:16 PM  Readmission Risk Prevention Plan  Transportation Screening Complete Complete  PCP or Specialist Appt within 5-7 Days  Complete  HRI or Home Care Consult Complete   Social Work Consult for Recovery Care Planning/Counseling Complete   Palliative Care Screening Not Applicable    Medication Review Oceanographer) Complete

## 2023-12-18 NOTE — Progress Notes (Signed)
 PROGRESS NOTE  [name]  FMW:[id number] DOB: 09-Feb-1946 DOA: 12/16/2023 PCP: System, Provider Not In   Brief Narrative: Patient is a 78 year old male with history of CVA/right-sided residual hemiparesis, non-small cell lung cancer s/p radiation therapy, MGUS with possible conversion to multiple myeloma, not felt to be a candidate for additional treatment, CKD stage IIIb-4, hypertension, diabetes type 2, gastroparesis esophagitis, upper GI bleed, chronically debilitated/ bedbound who was brought from nursing facility for the evaluation of nausea, vomiting, abdominal pain, inability to take p.o.  Report of feeling of food getting stuck in his upper chest found to be very weak and deconditioned on presentation.  Lab work showed severe electrolyte abnormalities with severe hypokalemia, hypomagnesemia, hypophosphatemia.  GI consulted.  Possible plan for EGD  Assessment & Plan:  Principal Problem:   Electrolyte and fluid disorder Active Problems:   Type 2 diabetes mellitus (HCC)   Gout   BARRETTS ESOPHAGUS   Diarrhea   Hyperlipidemia   AKI (acute kidney injury) (HCC)   Gastritis and gastroduodenitis   Duodenal adenoma   Dehydration   Severe dehydration secondary to dysphagia: Presented with poor oral intake, nausea, vomiting.  Continue gentle IV fluids.  Could not be found venous access on presentation so had to be placed femoral central line was planned needs to be removed in next 2 to 3 days. GI following.  Possible plan for EGD. CT abdomen/pelvis did not show any acute findings.  But showed incidental finding of sigmoid wall thickening.  Abdomen is soft, nontender, nondistended.  Having bowel movements.  Electrolyte abnormalities: Has hypokalemia, hypophosphatemia, hypomagnesemia.  Currently being supplemented and monitored  AKI on CKD stage IIIa-4: Baseline creatinine around 2.  Continue function at baseline.  Debility/deconditioning: Appears very deconditioned.  Lives at skilled  nursing facility.  History of CVA with right-sided hemiparesis.  PT/OT saw him here.  History of chronic spasticity.  On gabapentin , baclofen .  Nonambulatory, bedbound.  Has chronic right-sided edema on both upper and lower extremities  Elevated troponin: Flat trend.  Denies chest pain.  Likely demand ischemia.  Hyperlipidemia: On statin  BPH: On Flomax   History of esophagitis: Continue PPI  Hypertension: Takes amlodipine , Lasix  at home.  Currently this medicines are on hold.  Currently on metoprolol .  Might need to resume Lasix  tomorrow.    History of lung cancer/MGUS: Will recommend follow-up with oncology as an outpatient.  Insulin -dependent diabetes type 2: Continue current insulin  regimen.  Monitor blood sugars  BPH: Continue Flomax             DVT prophylaxis:heparin  injection 5,000 Units Start: 12/16/23 2215     Code Status: Limited: Do not attempt resuscitation (DNR) -DNR-LIMITED -Do Not Intubate/DNI   Family Communication: Wife at bedside  Patient status:Inpatient  Patient is from :SNF  Anticipated discharge to:SNF  Estimated DC date:2-3 days   Consultants: GI  Procedures:None yet  Antimicrobials:  Anti-infectives (From admission, onward)    None       Subjective: Patient seen and examined at bedside today.  He looks overall comfortable.  Lying in bed.  Wife at bedside.  He is alert, awake and obeys commands.  Has contractures in lower extremities, has chronic edema of the right upper and lower extremity but as per wife, this has recently worsened.  Communicates but confused to time.  Complains of pain everywhere.  Objective: Vitals:   12/17/23 1530 12/17/23 2154 12/18/23 0600 12/18/23 0608  BP: (!) 164/94 (!) 151/97  (!) 149/88  Pulse:  93  94  Resp:  16  18  Temp:  98.8 F (37.1 C)  98 F (36.7 C)  TempSrc:      SpO2: 100% 100%  100%  Weight:   96.5 kg   Height:        Intake/Output Summary (Last 24 hours) at 12/18/2023 0937 Last data  filed at 12/18/2023 9385 Gross per 24 hour  Intake 1224.42 ml  Output 1000 ml  Net 224.42 ml   Filed Weights   12/17/23 0033 12/17/23 0506 12/18/23 0600  Weight: 96.4 kg 96.3 kg 96.5 kg    Examination:  General exam: Overall comfortable, not in distress, very deconditioned HEENT: Ptosis on the left eye Respiratory system:  no wheezes or crackles  Cardiovascular system: S1 & S2 heard, RRR.  Gastrointestinal system: Abdomen is nondistended, soft and nontender. Central nervous system: Alert and awake, oriented to place, right hemiplegia Extremities: Edema of right upper and lower extremity, no clubbing ,no cyanosis, contractures of lower extremities Skin: No rashes, no ulcers,no icterus     Data Reviewed: I have personally reviewed following labs and imaging studies  CBC: Recent Labs  Lab 12/16/23 1355 12/17/23 2210 12/18/23 0828  WBC 15.7* 14.3* 13.2*  NEUTROABS 14.6* 12.6* 11.4*  HGB 12.1* 10.4* 9.3*  HCT 41.0 34.3* 30.5*  MCV 74.0* 72.2* 71.9*  PLT 256 215 180   Basic Metabolic Panel: Recent Labs  Lab 12/16/23 1355 12/16/23 1851 12/17/23 1253 12/17/23 2209 12/18/23 0828  NA 152* 149* 149*  --  140  K <2.0* 3.2* 2.3* 2.5* 2.6*  CL 119* 120* 119*  --  114*  CO2 22 16* 22  --  23  GLUCOSE 111* 100* 91  --  139*  BUN 23 22 20   --  16  CREATININE 2.44* 2.28* 2.10*  --  1.86*  CALCIUM  8.9 8.7* 9.0  --  8.2*  MG 1.7  --  1.6*  --  1.7  PHOS  --  2.3* 2.0*  --  1.3*     Recent Results (from the past 240 hours)  MRSA Next Gen by PCR, Nasal     Status: None   Collection Time: 12/17/23  5:21 AM   Specimen: Nasal Mucosa; Nasal Swab  Result Value Ref Range Status   MRSA by PCR Next Gen NOT DETECTED NOT DETECTED Final    Comment: (NOTE) The GeneXpert MRSA Assay (FDA approved for NASAL specimens only), is one component of a comprehensive MRSA colonization surveillance program. It is not intended to diagnose MRSA infection nor to guide or monitor treatment for MRSA  infections. Test performance is not FDA approved in patients less than 37 years old. Performed at [name], [address] [place], [place] [address]   C Difficile Quick Screen w PCR reflex     Status: Abnormal   Collection Time: 12/17/23  3:16 PM   Specimen: STOOL  Result Value Ref Range Status   C Diff antigen POSITIVE (A) NEGATIVE Final   C Diff toxin NEGATIVE NEGATIVE Final   C Diff interpretation Results are indeterminate. See PCR results.  Final    Comment: Performed at [facility], [address], [place], [place] [address]  C. Diff by PCR, Reflexed     Status: None   Collection Time: 12/17/23  3:16 PM  Result Value Ref Range Status   Toxigenic C. Difficile by PCR NEGATIVE NEGATIVE Final    Comment: Patient is colonized with non toxigenic C. difficile. May not need treatment unless significant symptoms are present.  Performed at [facility], [address], [place], [place] [address]      Radiology Studies: NM Pulmonary Perfusion Result Date: 12/16/2023 CLINICAL DATA:  Difficulty breathing EXAM: NUCLEAR MEDICINE PERFUSION LUNG SCAN TECHNIQUE: Perfusion images were obtained in multiple projections after intravenous injection of radiopharmaceutical. Ventilation scans intentionally deferred if perfusion scan and chest x-ray adequate for interpretation during COVID 19 epidemic. RADIOPHARMACEUTICALS:  4.2 mCi Tc-66m MAA IV COMPARISON:  Chest x-ray from earlier in the same day. FINDINGS: Adequate uptake is noted throughout both lungs. No focal defect to suggest pulmonary embolism is noted. IMPRESSION: No evidence of pulmonary embolus. Electronically Signed   By: Oneil Devonshire M.D.   On: 12/16/2023 18:18   US  Venous Img Lower Right (DVT Study) Result Date: 12/16/2023 CLINICAL DATA:  Right lower extremity edema.  Evaluate for DVT. EXAM: RIGHT LOWER EXTREMITY VENOUS DOPPLER ULTRASOUND TECHNIQUE: Gray-scale sonography with graded compression, as well as color Doppler and duplex  ultrasound were performed to evaluate the lower extremity deep venous systems from the level of the common femoral vein and including the common femoral, femoral, profunda femoral, popliteal and calf veins including the posterior tibial, peroneal and gastrocnemius veins when visible. The superficial great saphenous vein was also interrogated. Spectral Doppler was utilized to evaluate flow at rest and with distal augmentation maneuvers in the common femoral, femoral and popliteal veins. COMPARISON:  Right lower extremity venous Doppler ultrasound-07/27/2018 FINDINGS: Contralateral Common Femoral Vein: There is mixed echogenic near occlusive thrombus within the contralateral left common femoral vein (images 3 through 6). Common Femoral Vein: No evidence of thrombus. Normal compressibility, respiratory phasicity and response to augmentation. Saphenofemoral Junction: No evidence of thrombus. Normal compressibility and flow on color Doppler imaging. Profunda Femoral Vein: No evidence of thrombus. Normal compressibility and flow on color Doppler imaging. Femoral Vein: No evidence of thrombus. Normal compressibility, respiratory phasicity and response to augmentation. Popliteal Vein: No evidence of thrombus. Normal compressibility, respiratory phasicity and response to augmentation. Calf Veins: Appear patent where visualized Superficial Great Saphenous Vein: Appears patent where visualized Other Findings: There is a moderate amount of subcutaneous edema at the level of the right lower leg. IMPRESSION: 1. No definite evidence of DVT within right lower extremity. 2. Near occlusive thrombus within the contralateral left common femoral vein, presumably chronic in etiology though new compared to the 07/2018 examination. Clinical correlation is advised. Electronically Signed   By: Norleen Roulette M.D.   On: 12/16/2023 16:28   DG Chest Portable 1 View Result Date: 12/16/2023 CLINICAL DATA:  Abdominal pain, edema EXAM: PORTABLE  CHEST - 1 VIEW COMPARISON:  CT 05/10/2023, and previous FINDINGS: Left apical bullous change with an area of bandlike consolidation in the upper lobe which is stable from prior study. No new infiltrate or overt edema. Heart size and mediastinal contours are within normal limits. No effusion. Advanced bilateral glenohumeral DJD. IMPRESSION: 1. No acute findings. 2. Left apical bullous change with stable bandlike consolidation. Electronically Signed   By: JONETTA Faes M.D.   On: 12/16/2023 14:56   CT ABDOMEN PELVIS WO CONTRAST Result Date: 12/16/2023 CLINICAL DATA:  Acute abdominal pain, history of gastric carcinoma EXAM: CT ABDOMEN AND PELVIS WITHOUT CONTRAST TECHNIQUE: Multidetector CT imaging of the abdomen and pelvis was performed following the standard protocol without IV contrast. RADIATION DOSE REDUCTION: This exam was performed according to the departmental dose-optimization program which includes automated exposure control, adjustment of the mA and/or kV according to patient size and/or use of iterative reconstruction technique.  COMPARISON:  10/17/2023 FINDINGS: Lower chest: No pleural or pericardial effusion. Scattered coronary and aortic calcifications. Small hiatal hernia. Hepatobiliary: No focal liver abnormality is seen. Status post cholecystectomy. No biliary dilatation. Pancreas: Unremarkable. No pancreatic ductal dilatation or surrounding inflammatory changes. Spleen: Normal in size without focal abnormality. Adrenals/Urinary Tract: No adrenal mass. No urolithiasis or hydronephrosis. Multiple cortical lesions in both kidneys, some of which can be characterized as cysts, largest 4.9 cm 13 HU, left upper pole; no followup recommended. Urinary bladder partially distended. Stomach/Bowel: Small hiatal hernia. Stomach is nondistended, with old gastrostomy tract. Small bowel is decompressed. Normal appendix. The colon is partially distended, with multiple scattered diverticula. Segmental wall thickening is  suspected in the proximal sigmoid colon, without adjacent inflammatory change. Vascular/Lymphatic: Moderate aortoiliac calcified atheromatous plaque without aneurysm. No abdominal or pelvic adenopathy. Reproductive: Prostate enlargement. Other: Right pelvic phleboliths.  No ascites.  No free air. Musculoskeletal: Post cement vertebral augmentation L2. Stable spondylitic changes L3-S1. IMPRESSION: 1. No acute findings. 2. Colonic diverticulosis. 3. Small hiatal hernia. 4. Smooth wall thickening in the proximal sigmoid colon without adjacent inflammatory change, nonspecific. Consider outpatient elective colonoscopic evaluation. 5.  Aortic Atherosclerosis (ICD10-I70.0). Electronically Signed   By: JONETTA Faes M.D.   On: 12/16/2023 14:51    Scheduled Meds:  allopurinol   150 mg Oral Daily   aspirin  EC  81 mg Oral Daily   buPROPion  ER  100 mg Oral BID   Chlorhexidine  Gluconate Cloth  6 each Topical Daily   cinacalcet   30 mg Oral Q breakfast   feeding supplement  1 Container Oral TID BM   gabapentin   300 mg Oral BID   heparin   5,000 Units Subcutaneous Q8H   insulin  aspart  0-6 Units Subcutaneous TID WC   linaclotide   145 mcg Oral QAC breakfast   metoprolol  tartrate  50 mg Oral Daily   pantoprazole  (PROTONIX ) IV  40 mg Intravenous Q12H   phosphorus  500 mg Oral TID   potassium & sodium phosphates   2 packet Oral Q4H   potassium chloride   40 mEq Oral Once   rosuvastatin   20 mg Oral QPM   sodium chloride  flush  10-40 mL Intracatheter Q12H   sodium chloride  flush  3 mL Intravenous Q12H   sucralfate   1 g Oral TID WC & HS   tamsulosin   0.4 mg Oral QPM   Continuous Infusions:  dextrose  75 mL/hr at 12/18/23 0843   magnesium  sulfate bolus IVPB     potassium chloride        LOS: 2 days   Ivonne Mustache, MD [facility] P1/05/2024, 9:37 AM

## 2023-12-18 NOTE — Evaluation (Signed)
 Clinical/Bedside Swallow Evaluation Patient Details  Name: [name] MRN: [id number] Date of Birth: Apr 12, 1946  Today's Date: 12/18/2023 Time: SLP Start Time (ACUTE ONLY): 1225 SLP Stop Time (ACUTE ONLY): 1301 SLP Time Calculation (min) (ACUTE ONLY): 36 min  Past Medical History:  Past Medical History:  Diagnosis Date   Arthritis    Asthma    hx of   Barrett's esophagus    EGD 03/23/2011 & EGD 2/09 bx proven   BPH (benign prostatic hyperplasia)    Cancer of parotid gland (HCC) 11/23/12   Adenocarcinoma   Chronic abdominal pain    Chronic constipation    Colon polyp 03/23/2011   tubular adenoma, Dr. Shaaron   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) 07/15/2011   CVA (cerebral infarction) 1998   right sided deficit   Delayed gastric emptying 2018   Diverticulosis    TCS 03/23/11 pancolonic diverticula &TCS 5/08, pancolonic diverticula   DM (diabetes mellitus) (HCC)    Edema of lower extremity 12/21/12   bilateral    Esotropia of left eye    GERD (gastroesophageal reflux disease)    Glaucoma (increased eye pressure)    Gout    Gout    Hemorrhagic colitis 06/06/2012.   Hemorrhoids, internal 03/23/2011   tcs by Dr. Shaaron   Hepatitis    esosiniphilic, tx with prednisone   Hiatal hernia    History of radiation therapy 05/21/18- 05/30/18   Left Lung/ 54 Gy delivered in 3 fractions of 18 Gy. SBRT   HTN (hypertension)    Hx of radiation therapy 1974   right base of skull area-lymphoma   Hyperlipidemia    Lower facial weakness    Right   Lymphoma (HCC) 1974   XRT at [facility], right base of skull area   Neuropathy    Non-small cell lung cancer (HCC) dx'd 04/26/18   Peripheral edema    R>L legs   Rash    chronic, recurrent, R>L legs   Renal insufficiency    Steatohepatitis    liver biopsy 2009   Stroke [facility]) 1998   right hemiparesis/plegia   Past Surgical History:  Past Surgical History:  Procedure Laterality Date   BIOPSY  12/01/2016   Procedure: BIOPSY;   Surgeon: Lamar CHRISTELLA Shaaron, MD;  Location: [facility];  Service: Endoscopy;;  duodenum, gastric, esophagus   BIOPSY  07/15/2022   Procedure: BIOPSY;  Surgeon: Shaaron Lamar CHRISTELLA, MD;  Location: [facility];  Service: Endoscopy;;  duodenal, mass;   BIOPSY  10/19/2023   Procedure: BIOPSY;  Surgeon: Cinderella Deatrice FALCON, MD;  Location: [facility];  Service: Endoscopy;;   CHOLECYSTECTOMY     COLONOSCOPY  03/23/11   Dr. Shaaron  pancolonic diverticula, hemorrhoids, tubular adenoma.. next tcs 03/2016   COLONOSCOPY WITH PROPOFOL  N/A 12/01/2016   inadequate bowel prep precluded exam   ESOPHAGOGASTRODUODENOSCOPY  02/05/08   goblet cell metaplasia/negative for H.pylori   ESOPHAGOGASTRODUODENOSCOPY  03/23/11   Dr. Shaaron, barretts, hiatal hernia   ESOPHAGOGASTRODUODENOSCOPY (EGD) WITH PROPOFOL  N/A 12/01/2016   Dr. Shaaron: Large amount of retained gastric contents precluded completion of the stomach. Mucosal changes were found in the stomach. Erosions and somewhat scalloped appearing mucosa present, reactive gastritis/no H pylori. Barrett's esophagus noted, no dysplasia on biopsy. Duodenal biopsies taken as well, benign, no evidence of eosinophilia.   ESOPHAGOGASTRODUODENOSCOPY (EGD) WITH PROPOFOL  N/A 07/15/2022   Procedure: ESOPHAGOGASTRODUODENOSCOPY (EGD) WITH PROPOFOL ;  Surgeon: Shaaron Lamar CHRISTELLA, MD;  [facility];  Service: Endoscopy;  Laterality: N/A;   ESOPHAGOGASTRODUODENOSCOPY (EGD) WITH PROPOFOL  N/A 10/19/2023   Procedure: ESOPHAGOGASTRODUODENOSCOPY (EGD) WITH PROPOFOL ;  Surgeon: Cinderella Deatrice FALCON, MD;  [facility];  Service: Endoscopy;  Laterality: N/A;   IR FLUORO GUIDED NEEDLE PLC ASPIRATION/INJECTION LOC  12/13/2018   IR KYPHO LUMBAR INC FX REDUCE BONE BX UNI/BIL CANNULATION INC/IMAGING  12/13/2018   IR RADIOLOGY PERIPHERAL GUIDED IV START  12/13/2018   IR US  GUIDE VASC ACCESS LEFT  12/13/2018   MASS BIOPSY  11/01/2012   Procedure: NECK MASS BIOPSY;  Surgeon: Ana LELON Moccasin, MD;  Location: [facility]  ORS;  Service: ENT;  Laterality: Right;  Excisional Bx Right Neck Mass; attempted external jugular cutdown of left side   PAROTIDECTOMY  11/24/2012   Procedure: PAROTIDECTOMY;  Surgeon: Ana LELON Moccasin, MD;  Location: [facility] OR;  Service: ENT;  Laterality: N/A;  Total parotidectomy   PLEURECTOMY     right lymph node removal Right    behind right ear   Right video-assisted thoracic surgery, pleurectomy, and pleurodesis  2011   VIDEO BRONCHOSCOPY WITH ENDOBRONCHIAL NAVIGATION N/A 04/26/2018   Procedure: VIDEO BRONCHOSCOPY WITH ENDOBRONCHIAL NAVIGATION;  Surgeon: Kerrin Elspeth BROCKS, MD;  Location: [facility] OR;  Service: Thoracic;  Laterality: N/A;   HPI:  [name] is a 78 y.o. male , resident at [facility], with hx of CVA with residual right-sided hemiparesis, stage Ib non-small cell lung cancer s/p SBRT, MGUS with possible conversion to multiple myeloma, not felt to be a candidate for additional treatment of his malignancies, CKD 3-4, hypertension, diabetes, Barrett's esophagus, EOE, gastroparesis, recent history of upper GI bleeding and recent endoscopy 11/ 24 demonstrating esophagitis and gastritis, duodenal mass which was biopsied (duodenal adenoma, mild-mod chronic inactive gastritis, no H pylori).  Was brought into ED from nursing facility due to 2 weeks of inability to take anything oral. He also reports dysphagia, vomiting, mid abdominal pain and loose stools. BSE requested. GI is following and ordered a barium swallow.    Assessment / Plan / Recommendation  Clinical Impression  Clinical swallow evaluation completed this date with wife at bedside. Pt has been residing at [facility] for the past year+. Pt and spouse report that ~one week ago, Pt began expectorating solid foods, no difficulty with liquids. Pt assessed with ice chips, thin water via cup/straw, puree, and regular textures. Pt consumed all without signs of aspiration and no reports of globus. Pt with mildly prolonged  oral phase with solids, but efficient and all material removed. Pt ok from an oropharyngeal standpoint to consume regular textures and thin liquids, however will defer to GI/MD following barium pill esophagram scheduled later today. OK for PO medication whole with water. SLP will follow pending results of BPE. Above to RN. SLP Visit Diagnosis: Dysphagia, unspecified (R13.10)    Aspiration Risk  No limitations    Diet Recommendation Regular;Thin liquid    Liquid Administration via: Cup;Straw Medication Administration: Whole meds with liquid Supervision: Patient able to self feed Compensations: Slow rate Postural Changes: Seated upright at 90 degrees;Remain upright for at least 30 minutes after po intake    Other  Recommendations Oral Care Recommendations: Oral care BID;Staff/trained caregiver to provide oral care    Recommendations for follow up therapy are one component of a multi-disciplinary discharge planning process, led by the attending physician.  Recommendations may be updated based on patient status, additional functional criteria and insurance authorization.  Follow up Recommendations Follow physician's recommendations for discharge plan  and follow up therapies      Assistance Recommended at Discharge    Functional Status Assessment Patient has had a recent decline in their functional status and demonstrates the ability to make significant improvements in function in a reasonable and predictable amount of time.  Frequency and Duration min 2x/week  1 week       Prognosis Prognosis for improved oropharyngeal function: Good Barriers to Reach Goals: Cognitive deficits      Swallow Study   General Date of Onset: 12/16/23 HPI: [name] is a 78 y.o. male , resident at [facility], with hx of CVA with residual right-sided hemiparesis, stage Ib non-small cell lung cancer s/p SBRT, MGUS with possible conversion to multiple myeloma, not felt to be a candidate  for additional treatment of his malignancies, CKD 3-4, hypertension, diabetes, Barrett's esophagus, EOE, gastroparesis, recent history of upper GI bleeding and recent endoscopy 11/ 24 demonstrating esophagitis and gastritis, duodenal mass which was biopsied (duodenal adenoma, mild-mod chronic inactive gastritis, no H pylori).  Was brought into ED from nursing facility due to 2 weeks of inability to take anything oral. He also reports dysphagia, vomiting, mid abdominal pain and loose stools. BSE requested. GI is following and ordered a barium swallow. Type of Study: Bedside Swallow Evaluation Previous Swallow Assessment: N/A Diet Prior to this Study: NPO Temperature Spikes Noted: No Respiratory Status: Room air History of Recent Intubation: No Behavior/Cognition: Alert;Pleasant mood;Cooperative Oral Cavity Assessment: Within Functional Limits Oral Care Completed by SLP: Recent completion by staff Oral Cavity - Dentition: Missing dentition Vision: Functional for self-feeding Self-Feeding Abilities: Able to feed self;Needs set up Patient Positioning: Upright in bed Baseline Vocal Quality: Normal;Low vocal intensity Volitional Cough: Cognitively unable to elicit Volitional Swallow: Able to elicit    Oral/Motor/Sensory Function Overall Oral Motor/Sensory Function: Mild impairment (Pt with left eye ptosis)   Ice Chips Ice chips: Within functional limits Presentation: Spoon   Thin Liquid Thin Liquid: Within functional limits Presentation: Cup;Self Fed;Straw    Nectar Thick Nectar Thick Liquid: Not tested   Honey Thick Honey Thick Liquid: Not tested   Puree Puree: Within functional limits Presentation: Spoon   Solid     Solid: Within functional limits Presentation: Self Fed     Thank you,  Lamar Candy, CCC-SLP [phone]  Bro[place]linn Longbottom 12/18/2023,1:22 PM

## 2023-12-18 NOTE — Progress Notes (Signed)
 Initial Nutrition Assessment  DOCUMENTATION CODES:   Not applicable  INTERVENTION:   When diet advanced, resume Boost Breeze po TID, each supplement provides 250 kcal and 9 grams of protein.  NUTRITION DIAGNOSIS:   Inadequate oral intake related to dysphagia as evidenced by NPO status.  GOAL:   Patient will meet greater than or equal to 90% of their needs  MONITOR:   Diet advancement, PO intake  REASON FOR ASSESSMENT:   Consult Assessment of nutrition requirement/status  ASSESSMENT:   78 yo male admitted with inability to take POs x 2 weeks, severe electrolyte disturbances, dehydration. PMH includes Barrett's esophagus, DM, diverticulosis, GERD, HTN, CVA - R hemiparesis, hepatitis, HLD, glaucoma, arthritis, BPH, non-small cell lung cancer not felt to be a candidate for additional treatments, MGUS with possible progression to multiple myeloma.  RD working remotely. Unable to complete NFPE at this time. S/P clinical swallow evaluation with SLP today, rec regular diet with thin liquids. GI following for barium pill esophagram. Recommendation for Palliative Care team / Hospice involvement noted.  Currently NPO.  Labs reviewed.  K 2.6 <-- 2.5 <-- 2.3 <-- 3.2 <-- < 2 Phos 1.3 <-- 2 <-- 2.3 Mag 1.7 <-- 1.6 <-- 1.7  CBG: 104-101  Medications reviewed and include Sensipar , Novolog , Protonix , K PHos , Phos-Nak, Klor-Con , Carafate , IV mag sulfate, IV potassium chloride .  Weight history reviewed. Usual weight 10/19/23 65.8 kg, currently 96.5 kg. Currently has deep pitting edema to BLE per RN assessment.  NUTRITION - FOCUSED PHYSICAL EXAM:  Unable to complete  Diet Order:   Diet Order             Diet NPO time specified  Diet effective midnight                   EDUCATION NEEDS:   No education needs have been identified at this time  Skin:  Skin Assessment: Reviewed RN Assessment  Last BM:  1/6 type 6  Height:   Ht Readings from Last 1 Encounters:   12/17/23 6' (1.829 m)    Weight:   Wt Readings from Last 1 Encounters:  12/18/23 96.5 kg    Ideal Body Weight:  80.9 kg  BMI:  Body mass index is 28.85 kg/m.  Estimated Nutritional Needs:   Kcal:  1900-2100  Protein:  85-100 gm  Fluid:  1.9-2.1 L   Suzen HUNT RD, LDN, CNSC Contact Inpatient RD using Secure Chat. If unavailable, use group chat RD Inpatient via Secure Chat in EPIC.

## 2023-12-19 DIAGNOSIS — R10817 Generalized abdominal tenderness: Secondary | ICD-10-CM | POA: Diagnosis not present

## 2023-12-19 DIAGNOSIS — K449 Diaphragmatic hernia without obstruction or gangrene: Secondary | ICD-10-CM

## 2023-12-19 DIAGNOSIS — Z66 Do not resuscitate: Secondary | ICD-10-CM

## 2023-12-19 DIAGNOSIS — R638 Other symptoms and signs concerning food and fluid intake: Secondary | ICD-10-CM

## 2023-12-19 DIAGNOSIS — R197 Diarrhea, unspecified: Secondary | ICD-10-CM | POA: Diagnosis not present

## 2023-12-19 DIAGNOSIS — Z7189 Other specified counseling: Secondary | ICD-10-CM

## 2023-12-19 DIAGNOSIS — R1310 Dysphagia, unspecified: Secondary | ICD-10-CM | POA: Diagnosis not present

## 2023-12-19 DIAGNOSIS — Q399 Congenital malformation of esophagus, unspecified: Secondary | ICD-10-CM

## 2023-12-19 DIAGNOSIS — D509 Iron deficiency anemia, unspecified: Secondary | ICD-10-CM

## 2023-12-19 DIAGNOSIS — Z515 Encounter for palliative care: Secondary | ICD-10-CM

## 2023-12-19 DIAGNOSIS — N179 Acute kidney failure, unspecified: Secondary | ICD-10-CM

## 2023-12-19 DIAGNOSIS — R10811 Right upper quadrant abdominal tenderness: Secondary | ICD-10-CM

## 2023-12-19 DIAGNOSIS — K224 Dyskinesia of esophagus: Secondary | ICD-10-CM

## 2023-12-19 DIAGNOSIS — E878 Other disorders of electrolyte and fluid balance, not elsewhere classified: Secondary | ICD-10-CM | POA: Diagnosis not present

## 2023-12-19 DIAGNOSIS — Z860101 Personal history of adenomatous and serrated colon polyps: Secondary | ICD-10-CM

## 2023-12-19 DIAGNOSIS — R935 Abnormal findings on diagnostic imaging of other abdominal regions, including retroperitoneum: Secondary | ICD-10-CM

## 2023-12-19 LAB — BASIC METABOLIC PANEL
Anion gap: 6 (ref 5–15)
BUN: 15 mg/dL (ref 8–23)
CO2: 19 mmol/L — ABNORMAL LOW (ref 22–32)
Calcium: 8.3 mg/dL — ABNORMAL LOW (ref 8.9–10.3)
Chloride: 112 mmol/L — ABNORMAL HIGH (ref 98–111)
Creatinine, Ser: 1.64 mg/dL — ABNORMAL HIGH (ref 0.61–1.24)
GFR, Estimated: 43 mL/min — ABNORMAL LOW (ref >60)
Glucose, Bld: 69 mg/dL — ABNORMAL LOW (ref 70–99)
Potassium: 3.2 mmol/L — ABNORMAL LOW (ref 3.5–5.1)
Sodium: 137 mmol/L (ref 135–145)

## 2023-12-19 LAB — GLUCOSE, CAPILLARY
Glucose-Capillary: 78 mg/dL (ref 70–99)
Glucose-Capillary: 82 mg/dL (ref 70–99)
Glucose-Capillary: 107 mg/dL — ABNORMAL HIGH (ref 70–99)
Glucose-Capillary: 178 mg/dL — ABNORMAL HIGH (ref 70–99)

## 2023-12-19 LAB — PHOSPHORUS: Phosphorus: 1.5 mg/dL — ABNORMAL LOW (ref 2.5–4.6)

## 2023-12-19 LAB — MAGNESIUM: Magnesium: 2.1 mg/dL (ref 1.7–2.4)

## 2023-12-19 MED ORDER — AMLODIPINE BESYLATE 5 MG PO TABS
10.0000 mg | ORAL_TABLET | Freq: Every day | ORAL | Status: DC
  Administered 2023-12-19 – 2023-12-22 (×4): 10 mg via ORAL
  Filled 2023-12-19 (×4): qty 2

## 2023-12-19 MED ORDER — POTASSIUM PHOSPHATES 15 MMOLE/5ML IV SOLN
30.0000 mmol | Freq: Once | INTRAVENOUS | Status: AC
  Administered 2023-12-19: 30 mmol via INTRAVENOUS
  Filled 2023-12-19: qty 10

## 2023-12-19 MED ORDER — ENSURE ENLIVE PO LIQD
237.0000 mL | Freq: Three times a day (TID) | ORAL | Status: DC
  Administered 2023-12-19 – 2023-12-22 (×9): 237 mL via ORAL

## 2023-12-19 MED ORDER — K PHOS MONO-SOD PHOS DI & MONO 155-852-130 MG PO TABS
500.0000 mg | ORAL_TABLET | Freq: Three times a day (TID) | ORAL | Status: DC
  Administered 2023-12-20 – 2023-12-22 (×7): 500 mg via ORAL
  Filled 2023-12-19 (×7): qty 2

## 2023-12-19 MED ORDER — POTASSIUM CHLORIDE 10 MEQ/100ML IV SOLN
10.0000 meq | INTRAVENOUS | Status: AC
  Administered 2023-12-19 (×4): 10 meq via INTRAVENOUS
  Filled 2023-12-19 (×3): qty 100

## 2023-12-19 MED ORDER — ADULT MULTIVITAMIN LIQUID CH
15.0000 mL | Freq: Every day | ORAL | Status: DC
  Administered 2023-12-19 – 2023-12-21 (×3): 15 mL via ORAL
  Filled 2023-12-19 (×6): qty 15

## 2023-12-19 MED ORDER — FUROSEMIDE 20 MG PO TABS
20.0000 mg | ORAL_TABLET | Freq: Every day | ORAL | Status: DC
  Administered 2023-12-19 – 2023-12-20 (×2): 20 mg via ORAL
  Filled 2023-12-19 (×2): qty 1

## 2023-12-19 NOTE — Progress Notes (Signed)
 [name], Jason Not In   Brief Narrative: Patient is a 78 year old male with history of CVA/right-sided residual hemiparesis, non-small cell lung cancer s/p radiation therapy, MGUS with possible conversion to multiple myeloma, not felt to be a candidate for additional treatment, CKD stage IIIb-4, hypertension, diabetes type 2, gastroparesis esophagitis, upper GI bleed, chronically debilitated/ bedbound who was brought from nursing facility for the evaluation of nausea, vomiting, abdominal pain, inability to take p.o.  Report of feeling of food getting stuck in his upper chest .Found to be very weak and deconditioned on presentation.  Lab work showed severe electrolyte abnormalities with severe hypokalemia, hypomagnesemia, hypophosphatemia.  GI, speech therapy consulted.  No plan for EGD.  Patient is clinically better.  Palliative care also consulted for goals of care.  DC planning back to to his nursing facility in next 1 to 2 days  Assessment & Plan:  Principal Problem:   Electrolyte and fluid disorder Active Problems:   Type 2 diabetes mellitus (HCC)   Gout   BARRETTS ESOPHAGUS   Diarrhea   Hyperlipidemia   AKI (acute kidney injury) (HCC)   Gastritis and gastroduodenitis   Duodenal adenoma   Dehydration   Vomiting   Esophageal dysphagia   Severe dehydration secondary to dysphagia: Presented with poor oral intake, nausea, vomiting.  Given gentle IV fluids.  Could not be found venous access on presentation so had to be placed femoral central line was planned needs to be removed in next 1-2 days. CT abdomen/pelvis did not show any acute findings.  But showed incidental finding of sigmoid wall thickening.  Abdomen is soft, nontender, nondistended.  Having bowel movements. GI, speech therapy following.  Speech cleared for regular diet.  DG esophagus showed  mild tortuosity and patulous distension of the  esophagus without discrete stricture or mass.  Currently on dysphagia 3 diet.  GI not planning for EGD  Electrolyte abnormalities: Has hypokalemia, hypophosphatemia, hypomagnesemia.  Currently being supplemented and monitored  AKI on CKD stage IIIa-4: Baseline creatinine around 2.  Currently kidney function at baseline.  Elevated troponin: Flat trend.  Denies chest pain.  Likely demand ischemia.  Hyperlipidemia: On statin  BPH: On Flomax   History of esophagitis: Continue PPI  Hypertension: Takes amlodipine , Lasix  , metoprolol .  We will resume   History of lung cancer/MGUS: Will recommend follow-up with oncology as an outpatient.  Insulin -dependent diabetes type 2: Continue current insulin  regimen.  Monitor blood sugars  BPH: Continue Flomax   Debility/deconditioning/goals of care: Appears very deconditioned.  Lives at skilled nursing facility.  History of CVA with right-sided hemiparesis.  PT/OT saw him here.  History of chronic spasticity.  On gabapentin , baclofen .  Nonambulatory, bedbound.  Has chronic right-sided edema on both upper and lower extremities.  CODE STATUS DNR.  Palliative care also consulted for goals of care. Goal is to discharge back to nursing facility where he came from.     Nutrition Problem: Inadequate oral intake Etiology: dysphagia    DVT prophylaxis:heparin  injection 5,000 Units Start: 12/16/23 2215     Code Status: Limited: Do not attempt resuscitation (DNR) -DNR-LIMITED -Do Not Intubate/DNI   Family Communication: Wife at bedside on 1/6  Patient status:Inpatient  Patient is from :SNF  Anticipated discharge to:SNF  Estimated DC date:1-2 days   Consultants: GI  Procedures:[name] yet  Antimicrobials:  Anti-infectives (From admission, onward)    [name]       Subjective: Patient seen and examined at  bedside today.  Appears comfortable.  Oriented to place.  Obeys commands not in any kind of distress.  Complains of pain everywhere.  Having  bowel movements.  No vomiting or nausea.  Remains weak and deconditioned which is his baseline.  Objective: Vitals:   12/18/23 1610 12/18/23 2008 12/19/23 0300 12/19/23 0417  BP: (!) 149/93 (!) 156/93  (!) 151/94  Pulse: 98 (!) 107  94  Resp: 15 16  18   Temp: 97.7 F (36.5 C) 98.3 F (36.8 C)  98.1 F (36.7 C)  TempSrc: Oral     SpO2: 100% 100%  100%  Weight:   97.2 kg   Height:        Intake/Output Summary (Last 24 hours) at 12/19/2023 1117 Last data filed at 12/19/2023 0300 Gross per 24 hour  Intake 1014.01 ml  Output 1000 ml  Net 14.01 ml   Filed Weights   12/17/23 0506 12/18/23 0600 12/19/23 0300  Weight: 96.3 kg 96.5 kg 97.2 kg    Examination:   General exam: Overall comfortable, not in distress, very deconditioned HEENT: Ptosis of left eye Respiratory [name]:  no wheezes or crackles  Cardiovascular [name]: S1 & S2 heard, RRR.  Gastrointestinal [name]: Abdomen is nondistended, soft and nontender. Central nervous [name]: Alert and awake, obeys commands, right-sided hemiplegia Extremities: Generalized anasarca but more edema of the right upper and lower extremity, contractures of bilateral lower extremities Skin: No rashes, no ulcers,no icterus     Data Reviewed: I have personally reviewed following labs and imaging studies  CBC: Recent Labs  Lab 12/16/23 1355 12/17/23 2210 12/18/23 0828  WBC 15.7* 14.3* 13.2*  NEUTROABS 14.6* 12.6* 11.4*  HGB 12.1* 10.4* 9.3*  HCT 41.0 34.3* 30.5*  MCV 74.0* 72.2* 71.9*  PLT 256 215 180   Basic Metabolic Panel: Recent Labs  Lab 12/16/23 1355 12/16/23 1851 12/17/23 1253 12/17/23 2209 12/18/23 0828 12/18/23 1557 12/19/23 0354  NA 152* 149* 149*  --  140  --  137  K <2.0* 3.2* 2.3* 2.5* 2.6* 3.0* 3.2*  CL 119* 120* 119*  --  114*  --  112*  CO2 22 16* 22  --  23  --  19*  GLUCOSE 111* 100* 91  --  139*  --  69*  BUN 23 22 20   --  16  --  15  CREATININE 2.44* 2.28* 2.10*  --  1.86*  --  1.64*  CALCIUM  8.9 8.7*  9.0  --  8.2*  --  8.3*  MG 1.7  --  1.6*  --  1.7  --  2.1  PHOS  --  2.3* 2.0*  --  1.3*  --  1.5*     Recent Results (from the past 240 hours)  MRSA Next Gen by PCR, Nasal     Status: [name]   Collection Time: 12/17/23  5:21 AM   Specimen: Nasal Mucosa; Nasal Swab  Result Value Ref Range Status   MRSA by PCR Next Gen NOT DETECTED NOT DETECTED Final    Comment: (NOTE) The GeneXpert MRSA Assay (FDA approved for NASAL specimens only), is one component of a comprehensive MRSA colonization surveillance program. It is not intended to diagnose MRSA infection nor to guide or monitor treatment for MRSA infections. Test performance is not FDA approved in patients less than 59 years old. Performed at [facility], [address], [place], [place] [address]   C Difficile Quick Screen w PCR reflex     Status: Abnormal   Collection  Time: 12/17/23  3:16 PM   Specimen: STOOL  Result Value Ref Range Status   C Diff antigen POSITIVE (A) NEGATIVE Final   C Diff toxin NEGATIVE NEGATIVE Final   C Diff interpretation Results are indeterminate. See PCR results.  Final    Comment: Performed at [facility], [address] [place], [place] [address]  C. Diff by PCR, Reflexed     Status: [name]   Collection Time: 12/17/23  3:16 PM  Result Value Ref Range Status   Toxigenic C. Difficile by PCR NEGATIVE NEGATIVE Final    Comment: Patient is colonized with non toxigenic C. difficile. May not need treatment unless significant symptoms are present. Performed at [facility], [address], [place], [place] [address]      Radiology Studies: DG ESOPHAGUS W DOUBLE CM (HD) Result Date: 12/18/2023 CLINICAL DATA:  Dysphagia. EXAM: ESOPHAGUS/BARIUM SWALLOW/TABLET STUDY TECHNIQUE: Single contrast examination was performed using water-soluble contrast and thin liquid barium. FLUOROSCOPY TIME:  1 minute, 24 seconds (7.3 mGy) COMPARISON:  [name] Available. FINDINGS: Examination is markedly degraded due to patient's  bed down state. The examination was performed on the fluoroscopy table with the patient positioned supine with head fluoroscopy table angled approximately 25 degrees. Swallowing: Not well evaluated though definitive aspiration was not visualized. Pharynx: Not well evaluated Esophagus: Mild tortuosity and patulous distension of the esophagus without discrete stricture or mass on this single contrast examination. Esophageal motility: Moderate esophageal dysmotility with multiple tertiary contractions in transient escape to the level of the mid esophagus. Hiatal Hernia: Small hiatal hernia. Gastroesophageal reflux: [name] visualized. Ingested 13 mm barium tablet: Not given Other: [name]. IMPRESSION: 1. Limited single contrast esophagram demonstrates mild tortuosity and patulous distension of the esophagus without discrete stricture or mass. 2. Moderate esophageal dysmotility. 3. Small hiatal hernia. Electronically Signed   By: Norleen Roulette M.D.   On: 12/18/2023 14:54    Scheduled Meds:  allopurinol   150 mg Oral Daily   aspirin  EC  81 mg Oral Daily   buPROPion  ER  100 mg Oral BID   Chlorhexidine  Gluconate Cloth  6 each Topical Daily   cinacalcet   30 mg Oral Q breakfast   feeding supplement  1 Container Oral TID BM   gabapentin   300 mg Oral BID   heparin   5,000 Units Subcutaneous Q8H   insulin  aspart  0-6 Units Subcutaneous TID WC   linaclotide   145 mcg Oral QAC breakfast   metoprolol  tartrate  50 mg Oral Daily   pantoprazole  (PROTONIX ) IV  40 mg Intravenous Q12H   [START ON 12/20/2023] phosphorus  500 mg Oral TID   rosuvastatin   20 mg Oral QPM   sodium chloride  flush  10-40 mL Intracatheter Q12H   sodium chloride  flush  3 mL Intravenous Q12H   sucralfate   1 g Oral TID WC & HS   tamsulosin   0.4 mg Oral QPM   Continuous Infusions:  potassium chloride  10 mEq (12/19/23 1045)   potassium PHOSPHATE  IVPB (in mmol)       LOS: 3 days   Ivonne Mustache, MD [facility] P1/06/2024, 11:17 AM

## 2023-12-19 NOTE — Progress Notes (Signed)
 Nutrition Follow-up / Consult  DOCUMENTATION CODES:   Not applicable  INTERVENTION:   Ensure Enlive po TID, each supplement provides 350 kcal and 20 grams of protein. MVI with minerals daily.  Consider holding cinacalcet  (Sensipar ) as it lowers phosphorus and calcium  levels. It can be resumed when phosphorus levels have stabilized.  NUTRITION DIAGNOSIS:   Inadequate oral intake related to dysphagia as evidenced by NPO status.  Ongoing, diet advancing to dysphagia 3 with thin liquids today.  GOAL:   Patient will meet greater than or equal to 90% of their needs  Unmet.   MONITOR:   Diet advancement, PO intake  REASON FOR ASSESSMENT:   Consult Assessment of nutrition requirement/status  ASSESSMENT:   78 yo male admitted with inability to take POs x 2 weeks, severe electrolyte disturbances, dehydration. PMH includes Barrett's esophagus, DM, diverticulosis, GERD, HTN, CVA - R hemiparesis, hepatitis, HLD, glaucoma, arthritis, BPH, non-small cell lung cancer not felt to be a candidate for additional treatments, MGUS with possible progression to multiple myeloma.  Patient reports improvement in abdominal pain and nausea. He wants to try solid foods today.  S/P bedside swallow evaluation with SLP yesterday. SLP recommends regular diet with thin liquids. He tolerated liquids yesterday. Advancing to dysphagia 3 (mechanical soft) with thin liquids today. Will change supplement to Ensure Enlive / Ensure Plus High Protein for more kcal and protein than Boost Breeze.   BPE yesterday showed mild tortuosity, no stricture or mass, moderate esophageal dysmotility, small hiatal hernia.   Labs reviewed. K 3.2, Phos 1.5, Calcium  8.3 CBG: 78-82 this morning  Medications reviewed and include sensipar , lasix , novolog , K Phos , carafate , flomax , IV potassium chloride , IV potassium phosphate .  Edema severe in BLE. Difficult to determine dry weight changes; suspect edema is masking weight loss  and more severe muscle depletion.   NUTRITION - FOCUSED PHYSICAL EXAM:  Flowsheet Row Most Recent Value  Orbital Region No depletion  Upper Arm Region No depletion  Thoracic and Lumbar Region No depletion  Buccal Region Mild depletion  Temple Region No depletion  Clavicle Bone Region No depletion  Clavicle and Acromion Bone Region No depletion  Scapular Bone Region Unable to assess  Dorsal Hand No depletion  Patellar Region Mild depletion  Anterior Thigh Region Mild depletion  Posterior Calf Region Mild depletion  Edema (RD Assessment) Severe  Hair Reviewed  Eyes Reviewed  Mouth Reviewed  Skin Reviewed  Nails Reviewed       Diet Order:   Diet Order             DIET DYS 3 Room service appropriate? Yes; Fluid consistency: Thin  Diet effective now                   EDUCATION NEEDS:   No education needs have been identified at this time  Skin:  Skin Assessment: Reviewed RN Assessment  Last BM:  1/7 type 6  Height:   Ht Readings from Last 1 Encounters:  12/17/23 6' (1.829 m)    Weight:   Wt Readings from Last 1 Encounters:  12/19/23 97.2 kg    Ideal Body Weight:  80.9 kg  BMI:  Body mass index is 29.06 kg/m.  Estimated Nutritional Needs:   Kcal:  1900-2100  Protein:  85-100 gm  Fluid:  1.9-2.1 L   Suzen HUNT RD, LDN, CNSC Contact Inpatient RD using Secure Chat. If unavailable, use group chat RD Inpatient via Secure Chat in EPIC.

## 2023-12-19 NOTE — Consult Note (Signed)
 Palliative Care Consult Note                                  Date: 12/19/2023   Patient Name: [name]  DOB: [birth year]  MRN: [id number]  Age / Sex: 78 y.o., male  PCP: System, Provider Not In Referring Physician: Jillian Buttery, MD  Reason for Consultation: Establishing goals of care  HPI/Patient Profile: 78 y.o. male  with past medical history of CVA/right-sided residual hemiparesis, non-small cell lung cancer s/p radiation therapy, MGUS with possible conversion to multiple myeloma, not felt to be a candidate for additional treatment, CKD stage IIIb-4, hypertension, diabetes type 2, gastroparesis esophagitis, upper GI bleed, chronically debilitated/ bedbound who was brought from nursing facility for the evaluation of nausea, vomiting, abdominal pain, inability to take p.o.  He was admitted on 12/16/2023 with severe dehydration secondary to dysphagia and poor oral intake, electrolyte abnormalities, AKI on CKD stage IIIa-4, history of lung cancer/MGUS, and others.   Palliative medicine was consulted for GOC conversations.  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 Shaaron   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) 07/15/2011   CVA (cerebral infarction) 1998   right sided deficit   Delayed gastric emptying 2018   Diverticulosis    TCS 03/23/11 pancolonic diverticula &TCS 5/08, pancolonic diverticula   DM (diabetes mellitus) (HCC)    Edema of lower extremity 12/21/12   bilateral    Esotropia of left eye    GERD (gastroesophageal reflux disease)    Glaucoma (increased eye pressure)    Gout    Gout    Hemorrhagic colitis 06/06/2012.    Hemorrhoids, internal 03/23/2011   tcs by Dr. Shaaron   Hepatitis    esosiniphilic, tx with prednisone   Hiatal hernia    History of radiation therapy 05/21/18- 05/30/18   Left Lung/ 54 Gy delivered in 3 fractions of 18 Gy. SBRT   HTN (hypertension)    Hx of radiation therapy 1974   right base of skull area-lymphoma   Hyperlipidemia    Lower facial weakness    Right   Lymphoma (HCC) 1974   XRT at [facility], right base of skull area   Neuropathy    Non-small cell lung cancer (HCC) dx'd 04/26/18   Peripheral edema    R>L legs   Rash    chronic, recurrent, R>L legs   Renal insufficiency    Steatohepatitis    liver biopsy 2009   Stroke [facility]) 1998   right hemiparesis/plegia    Subjective:   This NP Camellia Kays reviewed medical records, received report from team, assessed the patient and then meet at the patient's bedside to discuss diagnosis, prognosis, GOC, EOL wishes disposition and options.  I met with the patient at the bedside.  His wife Maryelizabeth was also present.   We meet to discuss diagnosis prognosis, GOC, EOL wishes, disposition and options. Concept of Palliative Care was introduced as specialized medical care for people and their families living with serious illness.  If focuses on providing relief  from the symptoms and stress of a serious illness.  The goal is to improve quality of life for both the patient and the family. Values and goals of care important to patient and family were attempted to be elicited.  Created space and opportunity for patient  and family to explore thoughts and feelings regarding current medical situation   Natural trajectory and current clinical status were discussed. Questions and concerns addressed. Patient  encouraged to call with questions or concerns.    Patient/Family Understanding of Illness: But the patient, although he is difficult to understand, and his wife have a pretty solid understanding on the patient's current situation.  They note his  history of lung cancer, recently saw oncology and indicated he is not a candidate for further treatment.  Also likely MGUS progressing to multiple myeloma, also not a candidate for further treatment.  He knows he has chronic kidney disease.  They state he began having difficulty swallowing and this impacted his intake such that he has not eaten much over the past 2 weeks.  They noted that he is very weak and dehydrated because of this.  They also note that he is debilitated and essentially bedbound at this point requiring 24-hour care at a long-term care nursing home.  Life Review: The patient and his wife been married for 42 years.  The patient has 2 children from a.  This marriage and his wife has 1 from a previous marriage.  The patient's son passed away at age 67 from a heart condition.  The patient previously lived with his wife, although now he lives at [facility] long-term care because of 24-hour care needs.  The patient's wife is clear she was unable to care for him at home any further, which was distressing for her.  The patient previously enjoyed rabbit hunting and fishing.  Goals: Focus on comfort given limited treatment options  Today's Discussion: In addition to discussions described above we had extensive discussion of various topics.  We reviewed the patient's current clinical situation.  I shared that he has limited treatment options and, given his poor oral intake, likely limited time.  His wife is intermittently and understandably tearful at this.  I asked if this surprised her and she stated not really that other providers to beat around the bush about this but on the first one to come out and say it.  I shared that my goal is to always be open and honest.  We shared the importance of setting care limitations based on the patient's wishes.  I reviewed the previously completed MOST form and the ACP tab/Vynca system.  He agreed that he would not want resuscitation.  He would be okay  with antibiotics and fluids in theory.  However, he would never want a feeding tube.  I shared that given his limited oral intake, even in the past day or 2 after he was cleared by speech, he is unlikely to be able to eat enough to sustain himself.  Given that he would not want a feeding tube this means that he would have some limitation in time left.  I shared that at times like this when options for cure are limited we can still have important discussions on how to care for him.  I shared that oftentimes it is appropriate to speak of comfort care at this time.  I shared that comfort care outside of the hospital is also known as hospice care.  I asked if anyone had ever  explained hospice in detail to them and they said no. I described hospice as a service for patients who have a life expectancy of 6 months or less. The goal of hospice is the preservation of dignity and quality at the end phases of life. Under hospice care, the focus changes from curative to symptom relief. I explained the three setting where hospice services can be provided including the home, at a living facility (such as LTC SNF, Assisted Living, etc), and a hospice facility. I explained that acceptance to hospice in any specific location is the final decision of the hospice medical director and bed availability, if applicable. They verbalized understanding.  I shared that hospice can be delivered in a number of places including home, in place at long-term care facility, and residential hospice facility.  The patient's wife shares that she knows that the patient would prefer to be able to come home 1 more time and receive hospice at home.  However, because of his bedbound status and her limitations she cannot adequately care for him at home, even with intermittent hospice support.  I shared that this is more often than not common as being a in-home caregiver is very difficult.  She shared that she has some guilt about this, but I again  reminded her that this is common and encouraged her to free herself from her guilt.  I shared the option of hospice in place at [facility].  I shared that this can also be considered a safety decision as well.  After ongoing and in-depth discussions it appears that they are theoretically on board with comfort care and hospice care.  However, I offered that I could request a referral to hospice for liaison to come and explain in more detail their services.  They have agreed to this.  I shared that I would follow-up tomorrow and we can continue our conversations.  I provided emotional and general support through therapeutic listening, empathy, sharing of stories, therapeutic touch, and other techniques. I answered all questions and addressed all concerns to the best of my ability.  After seeing the patient and his family, I shared the details of my conversation with the hospitalist and social worker.  TOC consult was entered.  Review of Systems  Constitutional:  Positive for fatigue.       Denies pain in general  Respiratory:  Negative for shortness of breath.   Cardiovascular:  Negative for chest pain.  Gastrointestinal:  Negative for nausea and vomiting.  Neurological:  Positive for weakness.    Objective:   Primary Diagnoses: Present on Admission:  Electrolyte and fluid disorder  Gout  BARRETTS ESOPHAGUS  Diarrhea  Hyperlipidemia  AKI (acute kidney injury) (HCC)  Duodenal adenoma  Gastritis and gastroduodenitis   Physical Exam Vitals and nursing note reviewed.  Constitutional:      General: He is not in acute distress.    Appearance: He is ill-appearing.  HENT:     Head: Normocephalic and atraumatic.  Pulmonary:     Effort: Pulmonary effort is normal.  Abdominal:     General: Abdomen is flat.  Neurological:     General: No focal deficit present.     Mental Status: He is alert.     Comments: Difficulty understanding his speech at times, likely deficit from previous  CVA  Psychiatric:        Mood and Affect: Mood normal.        Behavior: Behavior normal.     Vital Signs:  BP (!) 109/97 (BP Location: Left Arm)   Pulse 84   Temp 97.8 F (36.6 C) (Axillary)   Resp 19   Ht 6' (1.829 m)   Wt 97.2 kg   SpO2 100%   BMI 29.06 kg/m   Palliative Assessment/Data: 20%    Advanced Care Planning:   Existing Vynca/ACP Documentation: MOST form signed 07/11/2022  Primary Decision Maker: PATIENT  Code Status/Advance Care Planning: DNR-limited  A discussion was had today regarding advanced directives. Concepts specific to code status, artifical feeding and hydration, continued IV antibiotics and rehospitalization was had.  The difference between a aggressive medical intervention path and a palliative comfort care path for this patient at this time was had.   Decisions/Changes to ACP: None today  Assessment & Plan:   Impression: 78 year old male with acute presentation of chronic comorbidities as described above.  The patient is in a precarious situation with history of lung cancer, MGUS likely advancing the multiple myeloma both without ongoing treatment options due to his frailty and chronic comorbidities.  At this time he also has very limited oral intake and has clearly no desire for feeding tube.  He has confirmed desire for DNR status.  Given his situation we discussed comfort care and hospice care.  Patient and family are taking time tonight and tomorrow to think, hopeful for hospice liaison to come meet with them tomorrow as well.  I will continue conversations but I feel they will likely be agreeable to hospice care.  Most likely setting is in place at current LTC facility.  Overall long-term prognosis poor.  SUMMARY OF RECOMMENDATIONS   Remain DNR-limited Continue current scope of treatment TOC consult for hospice referral for liaison to discuss options and care details Palliative medicine will follow-up tomorrow for ongoing  discussions  Symptom Management:  Per primary team PMT is available to assist as needed  Prognosis:  < 6 months  Discharge Planning:  To Be Determined   Discussed with: Patient, patient's family, medical team, nursing team, [facility] team    Thank you for allowing us  to participate in the care of MAJID MCCRAVY PMT will continue to support holistically.  Time Total: 75 min  Detailed review of medical records (labs, imaging, vital signs), medically appropriate exam, discussed with treatment team, counseling and education to patient, family, & staff, documenting clinical information, medication management, coordination of care  Signed by: Camellia Kays, NP Palliative Medicine Team  Team Phone # [phone] (Nights/Weekends)  12/19/2023, 3:51 PM

## 2023-12-19 NOTE — Plan of Care (Signed)
  Problem: Activity: Goal: Risk for activity intolerance will decrease Outcome: Progressing   Problem: Coping: Goal: Level of anxiety will decrease Outcome: Progressing   Problem: Pain Management: Goal: General experience of comfort will improve Outcome: Progressing

## 2023-12-19 NOTE — Progress Notes (Signed)
   12/19/23 1730  Assess: MEWS Score  Temp 98.1 F (36.7 C)  BP (!) 145/102  MAP (mmHg) 115  Pulse Rate (!) 123  SpO2 100 %  Assess: MEWS Score  MEWS Temp 0  MEWS Systolic 0  MEWS Pulse 2  MEWS RR 0  MEWS LOC 0  MEWS Score 2  MEWS Score Color Yellow  Assess: if the MEWS score is Yellow or Red  Were vital signs accurate and taken at a resting state? Yes  Does the patient meet 2 or more of the SIRS criteria? No  MEWS guidelines implemented  Yes, yellow  Treat  MEWS Interventions Considered administering scheduled or prn medications/treatments as ordered  Take Vital Signs  Increase Vital Sign Frequency  Yellow: Q2hr x1, continue Q4hrs until patient remains green for 12hrs  Escalate  MEWS: Escalate Yellow: Discuss with charge nurse and consider notifying provider and/or RRT  Notify: Charge Nurse/RN  Name of Charge Nurse/RN Notified Raoul, Rn  Provider Notification  Provider Name/Title MD Adhikari  Date Provider Notified 12/19/23  Time Provider Notified [phone]  Provider response Other (Comment) (MD aware, no new orders at this time.)  Date of Provider Response 12/19/23  Time of Provider Response 1736  Assess: SIRS CRITERIA  SIRS Temperature  0  SIRS Respirations  0  SIRS Pulse 1  SIRS WBC 0  SIRS Score Sum  1   EKG Done, provider aware. Patient asymptomatic.

## 2023-12-19 NOTE — Progress Notes (Addendum)
 Subjective: 3 large type 6 Bms documented yesterday, brown in color.   Continues with abdominal pain that comes and goes, but seems to be a little better today.   Tolerated full liquid diet well yesterday. Would like to try some regular food today. Ddi well with peanut butter crackers with speech yesterday.   Tried calling [facility] to speak with patient's nurse to verify whether or not he has been transition to hospice care and to verify that he was taking PPI twice daily outpatient, but I was not able to reach anyone.    Objective: Vital signs in last 24 hours: Temp:  [97.7 F (36.5 C)-98.3 F (36.8 C)] 98.1 F (36.7 C) (01/07 0417) Pulse Rate:  [94-107] 94 (01/07 0417) Resp:  [15-18] 18 (01/07 0417) BP: (149-156)/(93-94) 151/94 (01/07 0417) SpO2:  [100 %] 100 % (01/07 0417) Weight:  [97.2 kg] 97.2 kg (01/07 0300) Last BM Date : 12/18/23 General:   Alert and oriented, pleasant, NAD.  Head:  Normocephalic and atraumatic. Abdomen:  Bowel sounds present, soft, non-distended. Mild generalized TTP, but improved compared to yesterday.  No rebound or guarding.  Msk:  Symmetrical without gross deformities. Normal posture. Extremities:  With LE edema bilaterally, R>L. Skin:  Warm and dry, intact without significant lesions.  Psych: Normal mood and affect.  Intake/Output from previous day: 01/06 0701 - 01/07 0700 In: 1014 [P.O.:120; IV Piggyback:894] Out: 1000 [Urine:1000] Intake/Output this shift: No intake/output data recorded.  Lab Results: Recent Labs    12/16/23 1355 12/17/23 2210 12/18/23 0828  WBC 15.7* 14.3* 13.2*  HGB 12.1* 10.4* 9.3*  HCT 41.0 34.3* 30.5*  PLT 256 215 180   BMET Recent Labs    12/17/23 1253 12/17/23 2209 12/18/23 0828 12/18/23 1557 12/19/23 0354  NA 149*  --  140  --  137  K 2.3*   < > 2.6* 3.0* 3.2*  CL 119*  --  114*  --  112*  CO2 22  --  23  --  19*  GLUCOSE 91  --  139*  --  69*  BUN 20  --  16  --  15  CREATININE 2.10*   --  1.86*  --  1.64*  CALCIUM  9.0  --  8.2*  --  8.3*   < > = values in this interval not displayed.   LFT Recent Labs    12/16/23 1355  PROT 7.3  ALBUMIN  3.1*  AST 22  ALT 14  ALKPHOS 77  BILITOT 0.7   Studies/Results: DG ESOPHAGUS W DOUBLE CM (HD) Result Date: 12/18/2023 CLINICAL DATA:  Dysphagia. EXAM: ESOPHAGUS/BARIUM SWALLOW/TABLET STUDY TECHNIQUE: Single contrast examination was performed using water-soluble contrast and thin liquid barium. FLUOROSCOPY TIME:  1 minute, 24 seconds (7.3 mGy) COMPARISON:  None Available. FINDINGS: Examination is markedly degraded due to patient's bed down state. The examination was performed on the fluoroscopy table with the patient positioned supine with head fluoroscopy table angled approximately 25 degrees. Swallowing: Not well evaluated though definitive aspiration was not visualized. Pharynx: Not well evaluated Esophagus: Mild tortuosity and patulous distension of the esophagus without discrete stricture or mass on this single contrast examination. Esophageal motility: Moderate esophageal dysmotility with multiple tertiary contractions in transient escape to the level of the mid esophagus. Hiatal Hernia: Small hiatal hernia. Gastroesophageal reflux: None visualized. Ingested 13 mm barium tablet: Not given Other: None. IMPRESSION: 1. Limited single contrast esophagram demonstrates mild tortuosity and patulous distension of the esophagus without discrete stricture or mass. 2.  Moderate esophageal dysmotility. 3. Small hiatal hernia. Electronically Signed   By: Norleen Roulette M.D.   On: 12/18/2023 14:54    Assessment: 78 y.o. year old male with history of stroke with residual right-sided hemiparesis, hepatic steatosis, renal insufficiency, non-small cell lung cancer, adenocarcinoma of the parotid gland, MGUS with possible conversion to multiple myeloma, not felt to be a candidate for additional treatment, asthma, HTN, DM, CKD, Barrett's esophagus, EOE, GERD,  gastroparesis, hemorrhoids, colon polyps, chronic constipation, hemorrhagic colitis in 2013 presumed infectious, chronic right upper quadrant pain suspected to be neurogenic or MSK, chronic IDA, admitted in August 2023 with hematemesis found to have hemorrhagic gastric polyps and duodenal adenoma with low-grade dysplasia with recommendations to follow conservatively considering overall poor health, recent admission in November 2024 with upper GI bleed (hematemesis) with EGD showing grade B esophagitis, erythematous gastric mucosa, 2 x 3 cm duodenal adenoma, no H pylori, now admitted with poor po intake x 2 weeks in the setting of dysphagia with vomiting, associated severe electrolyte disturbances and dehydration.   Dysphagia/vomiting:  New onset dysphagia for the last 2 weeks with associated vomiting as patient feels items are not passing through his esophagus, subsequently refusing po intake. Likely driven by esophageal dysmotility.   Recent EGD on file 10/19/2023 with grade B esophagitis, erythematous gastric mucosa, 2 x 3 cm duodenal adenoma. BPE yesterday with mild tortuosity and patulous distension of the esophagus without discrete stricture or mass, moderate esophageal dysmotility, small hiatal hernia.  Speech therapy saw patient yesterday and did well with consumption of liquids, pure, and regular textures. He was placed on a full liquid diet yesterday to start and did very well with this.  He would like to try advancing his diet today as he has had no recurrent nausea or vomiting. I feel this is reasonable as esophagram showed no obvious esophageal stricture. If unable to advance diet, could consider repeating EGD with empiric dilation though I am not sure how much benefit this would provide the patient.   Abdominal pain:  Chronic. Reports primarily right sided abdominal pain that has been present for months without change though he has mild generalized TTP, greatest in RUQ.  CT A/P without contrast  in November 2024 for abdominal pain with no acute findings though he had significant retained stool.  CT A/P without contrast completed 1/4 again with no acute findings.  He did have smooth wall thickening in the proximal sigmoid colon without adjacent inflammatory change, nonspecific with recommendations to consider elective outpatient colonoscopy. However, I do not think patient is a good candidate for colonoscopy in light of multiple morbidities. Additionally, if this is cancer, treatment is likely not an option considering his frailty. Clinically, abdominal pain has improved slightly, but is still present.   Diarrhea:  Etiology unclear. Could be overflow as he has chronic constipation, but this is an acute change. CT with smooth wall thickening in the proximal sigmoid colon without adjacent inflammatory change.  C. difficile negative.  GI pathogen panel placed yesterday, but has not been completed.  I have asked the nurse to follow-up on this as he continues to have [facility] type VI stools.  Electrolyte abnormalities:  Management per hospitalist.  GI will also send poor p.o. intake likely etiology.  Potassium improved to 3.2 today.  Magnesium  2.1.  Phosphorus remains low at 1.5.   Acute on chronic anemia: Known IDA. Hemoglobin 12.1 on admission, down to 10.4 yesterday, and 9.3 today.  No overt GI bleeding.  This is likely  driven by hemodilution.  Recent EGD in November 2024 with grade B esophagitis, erythematous gastric mucosa, 2 x 3 cm duodenal adenoma, no H. pylori.  Last complete colonoscopy was in 2012 with tubular adenoma.  CT this admission with smooth wall thickening in the proximal sigmoid colon without adjacent laboratory change. He is not a great candidate for endoscopic evaluation considering multi morbidity and frailty and the fact that oncology has already deemed his a not a candidate for ongoing treatment of other cancer. At this point, I recommended we continue to monitor for now.  I have  also recommended palliative involvement to help outline goals of care.  When I saw him during his admission in November, there was mention of possible hospice outpatient, but per wife yesterday, they never had follow-up with palliative outpatient.     Plan: Dysphagia 3 diet.  Continue PPI twice daily GI pathogen panel needs to be completed.  Notified nurse about this. Continue to monitor H&H and for overt GI bleeding. Electrolyte replacement per hospitalist. Needs palliative consult to discuss goals of care.   LOS: 3 days    12/19/2023, 10:24 AM   Josette Centers, PA-C [facility]

## 2023-12-19 NOTE — Plan of Care (Signed)

## 2023-12-20 DIAGNOSIS — R197 Diarrhea, unspecified: Secondary | ICD-10-CM | POA: Diagnosis not present

## 2023-12-20 DIAGNOSIS — R109 Unspecified abdominal pain: Secondary | ICD-10-CM

## 2023-12-20 DIAGNOSIS — R1310 Dysphagia, unspecified: Secondary | ICD-10-CM | POA: Diagnosis not present

## 2023-12-20 DIAGNOSIS — D509 Iron deficiency anemia, unspecified: Secondary | ICD-10-CM | POA: Diagnosis not present

## 2023-12-20 DIAGNOSIS — R110 Nausea: Secondary | ICD-10-CM

## 2023-12-20 DIAGNOSIS — E878 Other disorders of electrolyte and fluid balance, not elsewhere classified: Secondary | ICD-10-CM | POA: Diagnosis not present

## 2023-12-20 LAB — CBC
HCT: 33.6 % — ABNORMAL LOW (ref 39.0–52.0)
Hemoglobin: 10.4 g/dL — ABNORMAL LOW (ref 13.0–17.0)
MCH: 22.4 pg — ABNORMAL LOW (ref 26.0–34.0)
MCHC: 31 g/dL (ref 30.0–36.0)
MCV: 72.4 fL — ABNORMAL LOW (ref 80.0–100.0)
Platelets: 165 10*3/uL (ref 150–400)
RBC: 4.64 MIL/uL (ref 4.22–5.81)
RDW: 24.2 % — ABNORMAL HIGH (ref 11.5–15.5)
WBC: 11.3 10*3/uL — ABNORMAL HIGH (ref 4.0–10.5)
nRBC: 0 % (ref 0.0–0.2)

## 2023-12-20 LAB — GASTROINTESTINAL PANEL BY PCR, STOOL (REPLACES STOOL CULTURE)

## 2023-12-20 LAB — BASIC METABOLIC PANEL
Anion gap: 8 (ref 5–15)
BUN: 16 mg/dL (ref 8–23)
CO2: 18 mmol/L — ABNORMAL LOW (ref 22–32)
Calcium: 8.3 mg/dL — ABNORMAL LOW (ref 8.9–10.3)
Chloride: 110 mmol/L (ref 98–111)
Creatinine, Ser: 1.73 mg/dL — ABNORMAL HIGH (ref 0.61–1.24)
GFR, Estimated: 40 mL/min — ABNORMAL LOW (ref >60)
Glucose, Bld: 113 mg/dL — ABNORMAL HIGH (ref 70–99)
Potassium: 2.8 mmol/L — ABNORMAL LOW (ref 3.5–5.1)
Sodium: 136 mmol/L (ref 135–145)

## 2023-12-20 LAB — GLUCOSE, CAPILLARY
Glucose-Capillary: 147 mg/dL — ABNORMAL HIGH (ref 70–99)
Glucose-Capillary: 135 mg/dL — ABNORMAL HIGH (ref 70–99)
Glucose-Capillary: 165 mg/dL — ABNORMAL HIGH (ref 70–99)
Glucose-Capillary: 116 mg/dL — ABNORMAL HIGH (ref 70–99)

## 2023-12-20 LAB — MAGNESIUM: Magnesium: 1.9 mg/dL (ref 1.7–2.4)

## 2023-12-20 LAB — PHOSPHORUS: Phosphorus: 2.9 mg/dL (ref 2.5–4.6)

## 2023-12-20 MED ORDER — FUROSEMIDE 10 MG/ML PO SOLN: 20.0000 mg | Freq: Every day | ORAL | Status: DC

## 2023-12-20 MED ORDER — GABAPENTIN 300 MG PO CAPS
300.0000 mg | ORAL_CAPSULE | Freq: Two times a day (BID) | ORAL | Status: DC
Start: 2023-12-21 — End: 2023-12-22
  Administered 2023-12-20 – 2023-12-22 (×4): 300 mg via ORAL
  Filled 2023-12-20 (×4): qty 1

## 2023-12-20 MED ORDER — ALLOPURINOL 20 MG/ML ORAL SUSPENSION: 150.0000 mg | Freq: Every day | ORAL | Status: DC

## 2023-12-20 MED ORDER — ACETAMINOPHEN 160 MG/5ML PO SOLN: 650.0000 mg | Freq: Four times a day (QID) | ORAL | Status: DC | PRN

## 2023-12-20 MED ORDER — POTASSIUM CHLORIDE 20 MEQ PO PACK
40.0000 meq | PACK | ORAL | Status: AC
  Administered 2023-12-20 (×2): 40 meq via ORAL
  Filled 2023-12-20 (×2): qty 2

## 2023-12-20 MED ORDER — GABAPENTIN 250 MG/5ML PO SOLN
300.0000 mg | Freq: Two times a day (BID) | ORAL | Status: DC
Start: 2023-12-20 — End: 2023-12-20
  Filled 2023-12-20 (×4): qty 6

## 2023-12-20 NOTE — Progress Notes (Signed)
 [facility] Liaison Note  Received request, for hospice services at home after discharge. Spoke with spouse to initiate education related to hospice philosophy, services, and team approach to care. Patient/family verbalized understanding of information given.   DME needs discussed. Patient does not need any DME at this time.   Please send signed and completed DNR home with patient/family. Please provide prescriptions at discharge as needed to ensure ongoing symptom management.   [facility] information and contact numbers given to spouse. Above information shared with Transitions of Care Manager. Please call with any questions or concerns.   Thank you for the opportunity to participate in this patient's care.   Elouise Husband BSN, CHARITY FUNDRAISER, OCN [employer] [phone]

## 2023-12-20 NOTE — Progress Notes (Addendum)
 PROGRESS NOTE    [name]  FMW:[id number] DOB: 1946/01/04 DOA: 12/16/2023 PCP: System, Provider Not In  Chief Complaint  Patient presents with   Abdominal Pain   Emesis   Nausea    Brief Narrative:   Patient is [name] 78 year old male with history of CVA/right-sided residual hemiparesis, non-small cell lung cancer s/p radiation therapy, MGUS with possible conversion to multiple myeloma, not felt to be [name] candidate for additional treatment, CKD stage IIIb-4, hypertension, diabetes type 2, gastroparesis esophagitis, upper GI bleed, chronically debilitated/ bedbound who was brought from nursing facility for the evaluation of nausea, vomiting, abdominal pain, inability to take p.o.  Report of feeling of food getting stuck in his upper chest .Found to be very weak and deconditioned on presentation.  Lab work showed severe electrolyte abnormalities with severe hypokalemia, hypomagnesemia, hypophosphatemia.  GI, speech therapy consulted.  Assessment & Plan:   Principal Problem:   Electrolyte and fluid disorder Active Problems:   Type 2 diabetes mellitus (HCC)   Gout   BARRETTS ESOPHAGUS   Diarrhea   Hyperlipidemia   AKI (acute kidney injury) (HCC)   Gastritis and gastroduodenitis   Duodenal adenoma   Dehydration   Vomiting   Dysphagia  Goals of Care Appreciate palliative's assistance.  Seems like the family is leaning towards hospice/comfort measures.  When I spoke to him today, seemed like his swallowing issue causing persistent discomfort/issues.  Given it's rather acute presentation, I think reasonable to see what GI thinks about possible endoscopy.  Palliative/hospice probably will remain reasonable to discuss/consider.  Dysphagia Odynophagia  Severe dehydration Presentation seems rather acute, per discussion with wife/patient, symptoms began about 2 weeks ago (prior to that was taking PO normally).  Presented with n/v.  C/o odynophagia today. Esophagram with tortuosity and  patulous distension of the esophagus without discrete stricture or mass, moderate esophageal dysmotility Upper endoscopy 10/2023 with grade B reflux esophagitis without bleeding, erythematous mucosa in stomach which was biopsied Appreciate GI assistance, my question is whether EGD maybe appropriate given what sounds like an abrupt change in his swallowing, appreciate their reeval  Hypokalemia Hypophosphatemia Hypomagnesemia Replace as indicated, still notable hypokalemia   AKI on CKD stage IIIa-4: Baseline creatinine around 2.  Currently kidney function better than recent baseline.   Elevated troponin: Flat trend.  Denies chest pain.  Likely demand ischemia.   Hyperlipidemia: On statin   BPH: On Flomax    History of esophagitis: Continue PPI   Hypertension: Takes amlodipine , Lasix  , metoprolol .  We will resume    History of lung cancer/MGUS: Will recommend follow-up with oncology as an outpatient.   Insulin -dependent diabetes type 2: Continue current insulin  regimen.  Monitor blood sugars  Femoral Central Line - will discuss with RN obtaining PIV - femoral line needs to be removed as soon as reasonably able  Chronic DVT - seen on imaging from 1/4, will discuss with vascular    DVT prophylaxis: heparin  Code Status: DNR Family Communication: wife, son, nieces at bedside Disposition:   Status is: Inpatient Remains inpatient appropriate because: need for continued inpatient care   Consultants:  GI palliative  Procedures:  none  Antimicrobials:  Anti-infectives (From admission, onward)    None       Subjective: C/o pain with swallowing  Objective: Vitals:   12/19/23 1932 12/19/23 2332 12/20/23 0348 12/20/23 1320  BP: (!) 164/95 (!) 152/94 (!) 148/95 (!) 144/91  Pulse: (!) 117 (!) 120 (!) 109 87  Resp: 18 17 17 19   Temp: 98.7  F (37.1 C) 98.2 F (36.8 C) 98.2 F (36.8 C)   TempSrc: Oral Oral Oral   SpO2: 100% 100% 98% 100%  Weight:   105 kg   Height:         Intake/Output Summary (Last 24 hours) at 12/20/2023 1444 Last data filed at 12/20/2023 1000 Gross per 24 hour  Intake 790.36 ml  Output 400 ml  Net 390.36 ml   Filed Weights   12/18/23 0600 12/19/23 0300 12/20/23 0348  Weight: 96.5 kg 97.2 kg 105 kg    Examination:  General exam: chronically ill appearing Respiratory system: unlabored Cardiovascular system: RRR Gastrointestinal system: mild RLQ discomfort Central nervous system: Alert, answers questions inconsistently - but often appropriate in responses. Chronic R sided weakness. Extremities: anasarca    Data Reviewed: I have personally reviewed following labs and imaging studies  CBC: Recent Labs  Lab 12/16/23 1355 12/17/23 2210 12/18/23 0828 12/20/23 0406  WBC 15.7* 14.3* 13.2* 11.3*  NEUTROABS 14.6* 12.6* 11.4*  --   HGB 12.1* 10.4* 9.3* 10.4*  HCT 41.0 34.3* 30.5* 33.6*  MCV 74.0* 72.2* 71.9* 72.4*  PLT 256 215 180 165    Basic Metabolic Panel: Recent Labs  Lab 12/16/23 1355 12/16/23 1851 12/17/23 1253 12/17/23 2209 12/18/23 0828 12/18/23 1557 12/19/23 0354 12/20/23 0406  NA 152* 149* 149*  --  140  --  137 136  K <2.0* 3.2* 2.3* 2.5* 2.6* 3.0* 3.2* 2.8*  CL 119* 120* 119*  --  114*  --  112* 110  CO2 22 16* 22  --  23  --  19* 18*  GLUCOSE 111* 100* 91  --  139*  --  69* 113*  BUN 23 22 20   --  16  --  15 16  CREATININE 2.44* 2.28* 2.10*  --  1.86*  --  1.64* 1.73*  CALCIUM  8.9 8.7* 9.0  --  8.2*  --  8.3* 8.3*  MG 1.7  --  1.6*  --  1.7  --  2.1 1.9  PHOS  --  2.3* 2.0*  --  1.3*  --  1.5* 2.9    GFR: Estimated Creatinine Clearance: 44.8 mL/min ([name]) (by C-G formula based on SCr of 1.73 mg/dL (H)).  Liver Function Tests: Recent Labs  Lab 12/16/23 1355  AST 22  ALT 14  ALKPHOS 77  BILITOT 0.7  PROT 7.3  ALBUMIN  3.1*    CBG: Recent Labs  Lab 12/19/23 1112 12/19/23 1633 12/19/23 2107 12/20/23 0744 12/20/23 1110  GLUCAP 82 107* 178* 147* 135*     Recent Results (from the  past 240 hours)  MRSA Next Gen by PCR, Nasal     Status: None   Collection Time: 12/17/23  5:21 AM   Specimen: Nasal Mucosa; Nasal Swab  Result Value Ref Range Status   MRSA by PCR Next Gen NOT DETECTED NOT DETECTED Final    Comment: (NOTE) The GeneXpert MRSA Assay (FDA approved for NASAL specimens only), is one component of [name] comprehensive MRSA colonization surveillance program. It is not intended to diagnose MRSA infection nor to guide or monitor treatment for MRSA infections. Test performance is not FDA approved in patients less than 46 years old. Performed at [facility], [address], [place], [place] [address]   C Difficile Quick Screen w PCR reflex     Status: Abnormal   Collection Time: 12/17/23  3:16 PM   Specimen: STOOL  Result Value Ref Range Status   C Diff antigen POSITIVE ([name])  NEGATIVE Final   C Diff toxin NEGATIVE NEGATIVE Final   C Diff interpretation Results are indeterminate. See PCR results.  Final    Comment: Performed at [facility], [address], [place], [place] [address]  C. Diff by PCR, Reflexed     Status: None   Collection Time: 12/17/23  3:16 PM  Result Value Ref Range Status   Toxigenic C. Difficile by PCR NEGATIVE NEGATIVE Final    Comment: Patient is colonized with non toxigenic C. difficile. May not need treatment unless significant symptoms are present. Performed at [facility], [address], [place], [place] [address]   Gastrointestinal Panel by PCR , Stool     Status: None   Collection Time: 12/19/23 11:30 AM   Specimen: Stool  Result Value Ref Range Status   Campylobacter species NOT DETECTED NOT DETECTED Final   Plesimonas shigelloides NOT DETECTED NOT DETECTED Final   Salmonella species NOT DETECTED NOT DETECTED Final   Yersinia enterocolitica NOT DETECTED NOT DETECTED Final   Vibrio species NOT DETECTED NOT DETECTED Final   Vibrio cholerae NOT DETECTED NOT DETECTED Final   Enteroaggregative E coli (EAEC) NOT DETECTED NOT  DETECTED Final   Enteropathogenic E coli (EPEC) NOT DETECTED NOT DETECTED Final   Enterotoxigenic E coli (ETEC) NOT DETECTED NOT DETECTED Final   Shiga like toxin producing E coli (STEC) NOT DETECTED NOT DETECTED Final   Shigella/Enteroinvasive E coli (EIEC) NOT DETECTED NOT DETECTED Final   Cryptosporidium NOT DETECTED NOT DETECTED Final   Cyclospora cayetanensis NOT DETECTED NOT DETECTED Final   Entamoeba histolytica NOT DETECTED NOT DETECTED Final   Giardia lamblia NOT DETECTED NOT DETECTED Final   Adenovirus F40/41 NOT DETECTED NOT DETECTED Final   Astrovirus NOT DETECTED NOT DETECTED Final   Norovirus GI/GII NOT DETECTED NOT DETECTED Final   Rotavirus [name] NOT DETECTED NOT DETECTED Final   Sapovirus (I, II, IV, and V) NOT DETECTED NOT DETECTED Final    Comment: Performed at [facility], [address], [place], [place] [address]         Radiology Studies: No results found.      Scheduled Meds:  allopurinol   150 mg Oral Daily   amLODipine   10 mg Oral Daily   aspirin  EC  81 mg Oral Daily   buPROPion  ER  100 mg Oral BID   Chlorhexidine  Gluconate Cloth  6 each Topical Daily   feeding supplement  237 mL Oral TID BM   furosemide   20 mg Oral Daily   gabapentin   300 mg Oral BID   heparin   5,000 Units Subcutaneous Q8H   insulin  aspart  0-6 Units Subcutaneous TID WC   linaclotide   145 mcg Oral QAC breakfast   metoprolol  tartrate  50 mg Oral Daily   multivitamin  15 mL Oral Q supper   pantoprazole  (PROTONIX ) IV  40 mg Intravenous Q12H   phosphorus  500 mg Oral TID   rosuvastatin   20 mg Oral QPM   sodium chloride  flush  10-40 mL Intracatheter Q12H   sodium chloride  flush  3 mL Intravenous Q12H   sucralfate   1 g Oral TID WC & HS   tamsulosin   0.4 mg Oral QPM   Continuous Infusions:   LOS: 4 days    Time spent: over 30 min    Jason Monte, MD [facility]   To contact the attending provider between 7A-7P or the covering provider during after  hours 7P-7A, please log into the web site [email or web address]  and access using universal [place] password for that web site. If you do not have the password, please call the hospital operator.  12/20/2023, 2:44 PM

## 2023-12-20 NOTE — Progress Notes (Signed)
 Daily Progress Note   Patient Name: [name]       Date: 12/20/2023 DOB: 1946/04/07  Age: 78 y.o. MRN#: [id number] Attending Physician: Perri DELENA Meliton Mickey., * Primary Care Physician: System, Provider Not In Admit Date: 12/16/2023 Length of Stay: 4 days  Reason for Consultation/Follow-up: Establishing goals of care  HPI/Patient Profile:  78 y.o. male  with past medical history of CVA/right-sided residual hemiparesis, non-small cell lung cancer s/p radiation therapy, MGUS with possible conversion to multiple myeloma, not felt to be a candidate for additional treatment, CKD stage IIIb-4, hypertension, diabetes type 2, gastroparesis esophagitis, upper GI bleed, chronically debilitated/ bedbound who was brought from nursing facility for the evaluation of nausea, vomiting, abdominal pain, inability to take p.o.  He was admitted on 12/16/2023 with severe dehydration secondary to dysphagia and poor oral intake, electrolyte abnormalities, AKI on CKD stage IIIa-4, history of lung cancer/MGUS, and others.    Palliative medicine was consulted for GOC conversations.  Subjective:   Subjective: Chart Reviewed. Updates received. Patient Assessed. Created space and opportunity for patient  and family to explore thoughts and feelings regarding current medical situation.  Today's Discussion: Today saw the patient at the bedside, also present was his wife.  The patient states he is doing okay today, has had some Ensure.  Overall appetite still not very good.  When I first entered the room the patient's wife was on the phone.  After hanging up she confirmed that this was with [facility].  She shares that the liaison also came and spoke with her nieces and nephews, although she was not present.  She shared the conversation went well.  We explored details of the conversation, thoughts about hospice care both with the patient and his wife.  At the end of our conversation the patient agreed that he wants hospice care  in place at [facility].  His wife is on board with this decision and indicates her goal is to support what ever the patient wants.  She seemed to be fretting over making sure she is doing the right thing.  I provided reassurance.  Earlier today I spoke with the hospitalist.  He is going to reengage with GI to find out if EGD is warranted prior to discharge given the abrupt nature of his symptoms.  This will be deferred to GI decision.  I still feel that the patient is appropriate for hospice care.  In the worst case scenario if he gets to [facility] and his symptoms resolved where he is eating adequately and is no longer meets criteria for hospice he can graduate from hospice and continue his long-term care as he was prior to this admission.  However, again, I feel this is unlikely.  I provided emotional and general support through therapeutic listening, empathy, sharing of stories, therapeutic touch, and other techniques. I answered all questions and addressed all concerns to the best of my ability.  Review of Systems  Constitutional:  Positive for appetite change (Poor appetite) and fatigue.  Respiratory:  Negative for chest tightness and shortness of breath.   Cardiovascular:  Negative for chest pain.  Gastrointestinal:  Negative for nausea (None currently).  Neurological:  Positive for weakness.    Objective:   Vital Signs:  BP (!) 144/91 (BP Location: Left Arm)   Pulse 87   Temp 98.2 F (36.8 C) (Oral)   Resp 19   Ht 6' (1.829 m)   Wt 105 kg   SpO2 100%   BMI  31.39 kg/m   Physical Exam Vitals and nursing note reviewed.  Constitutional:      General: He is not in acute distress.    Appearance: He is ill-appearing.  HENT:     Head: Normocephalic and atraumatic.  Pulmonary:     Effort: Pulmonary effort is normal. No respiratory distress.  Abdominal:     General: Abdomen is flat.  Skin:    General: Skin is warm and dry.  Neurological:     General: No focal deficit  present.     Mental Status: He is alert.     Comments: Difficult to understand speech consistent with previous CVA  Psychiatric:        Mood and Affect: Mood normal.        Behavior: Behavior normal.     Palliative Assessment/Data: 10-20%    Existing Vynca/ACP Documentation: MOST form signed 07/11/2022   Assessment & Plan:   Impression: Present on Admission:  Electrolyte and fluid disorder  Gout  BARRETTS ESOPHAGUS  Diarrhea  Hyperlipidemia  AKI (acute kidney injury) (HCC)  Duodenal adenoma  Gastritis and gastroduodenitis  78 year old male with acute presentation of chronic comorbidities as described above. The patient is in a precarious situation with history of lung cancer, MGUS likely advancing the multiple myeloma both without ongoing treatment options due to his frailty and chronic comorbidities. At this time he also has very limited oral intake and has clearly no desire for feeding tube. He has confirmed desire for DNR status. Given his situation we discussed comfort care and hospice care.  Patient and wife have agreed to hospice services in place at [facility] long-term care.  Hospitalist working on further GI input related to diarrhea and acute onset abdominal symptoms, query value of EGD. Overall long-term prognosis poor.   SUMMARY OF RECOMMENDATIONS   DNR-limited Continue current scope of care while inpatient Anticipate discharge in 24 to 48 hours to [facility] LTC with hospice in place Palliative medicine will back off for now Please notify us  of any significant clinical change or new palliative needs  Symptom Management:  Per primary team PMT is available to assist as needed  Code Status: DNR-limited  Prognosis: < 6 months  Discharge Planning: Skilled Nursing Facility with Hospice  Discussed with: Patient, family, medical team, nursing team, [facility] team  Thank you for allowing us  to participate in the care of [name] PMT will continue to  support holistically.  Time Total: 45 min  Detailed review of medical records (labs, imaging, vital signs), medically appropriate exam, discussed with treatment team, counseling and education to patient, family, & staff, documenting clinical information, medication management, coordination of care  Camellia Kays, NP Palliative Medicine Team  Team Phone # [phone] (Nights/Weekends)  08/10/2021, 8:17 AM

## 2023-12-20 NOTE — Progress Notes (Signed)
   12/19/23 1932  Assess: MEWS Score  Temp 98.7 F (37.1 C)  BP (!) 164/95  MAP (mmHg) 112  Pulse Rate (!) 117  Resp 18  SpO2 100 %  O2 Device Room Air  Assess: MEWS Score  MEWS Temp 0  MEWS Systolic 0  MEWS Pulse 2  MEWS RR 0  MEWS LOC 0  MEWS Score 2  MEWS Score Color Yellow  Assess: if the MEWS score is Yellow or Red  Were vital signs accurate and taken at a resting state? Yes  Does the patient meet 2 or more of the SIRS criteria? No  MEWS guidelines implemented  Yes, yellow  Treat  MEWS Interventions Considered administering scheduled or prn medications/treatments as ordered  Take Vital Signs  Increase Vital Sign Frequency  Yellow: Q2hr x1, continue Q4hrs until patient remains green for 12hrs  Escalate  MEWS: Escalate Yellow: Discuss with charge nurse and consider notifying provider and/or RRT  Notify: Charge Nurse/RN  Name of Charge Nurse/RN Notified Bree, RN  Assess: SIRS CRITERIA  SIRS Temperature  0  SIRS Respirations  0  SIRS Pulse 1  SIRS WBC 0  SIRS Score Sum  1

## 2023-12-20 NOTE — Plan of Care (Signed)
  Problem: Education: Goal: Knowledge of General Education information will improve Description: Including pain rating scale, medication(s)/side effects and non-pharmacologic comfort measures Outcome: Progressing   Problem: Health Behavior/Discharge Planning: Goal: Ability to manage health-related needs will improve Outcome: Not Progressing   Problem: Clinical Measurements: Goal: Ability to maintain clinical measurements within normal limits will improve Outcome: Not Met (add Reason)   Problem: Activity: Goal: Risk for activity intolerance will decrease Outcome: Not Progressing

## 2023-12-20 NOTE — Progress Notes (Signed)
 Subjective: Patient still with nausea and diarrhea. States he did not eat this morning due to nausea but feels swallowing is improved. Having lower abdominal discomfort.   Objective: Vital signs in last 24 hours: Temp:  [97.8 F (36.6 C)-98.7 F (37.1 C)] 98.2 F (36.8 C) (01/08 0348) Pulse Rate:  [84-123] 109 (01/08 0348) Resp:  [17-19] 17 (01/08 0348) BP: (109-164)/(94-102) 148/95 (01/08 0348) SpO2:  [98 %-100 %] 98 % (01/08 0348) Weight:  [105 kg] 105 kg (01/08 0348) Last BM Date : 12/20/23 General:   Alert and oriented, pleasant Head:  Normocephalic and atraumatic. Heart:  S1, S2 present, no murmurs noted.  Lungs: Clear to auscultation bilaterally, without wheezing, rales, or rhonchi.  Abdomen:  Bowel sounds present, soft, non-tender, non-distended. No HSM or hernias noted. No rebound or guarding. No masses appreciated  Neurologic:  Alert and  oriented x4;  grossly normal neurologically. Psych:  Alert and cooperative. Normal mood and affect.   Lab Results: Recent Labs    12/17/23 2210 12/18/23 0828  WBC 14.3* 13.2*  HGB 10.4* 9.3*  HCT 34.3* 30.5*  PLT 215 180   BMET Recent Labs    12/18/23 0828 12/18/23 1557 12/19/23 0354 12/20/23 0406  NA 140  --  137 136  K 2.6* 3.0* 3.2* 2.8*  CL 114*  --  112* 110  CO2 23  --  19* 18*  GLUCOSE 139*  --  69* 113*  BUN 16  --  15 16  CREATININE 1.86*  --  1.64* 1.73*  CALCIUM  8.2*  --  8.3* 8.3*    Studies/Results: DG ESOPHAGUS W DOUBLE CM (HD) Result Date: 12/18/2023 CLINICAL DATA:  Dysphagia. EXAM: ESOPHAGUS/BARIUM SWALLOW/TABLET STUDY TECHNIQUE: Single contrast examination was performed using water-soluble contrast and thin liquid barium. FLUOROSCOPY TIME:  1 minute, 24 seconds (7.3 mGy) COMPARISON:  None Available. FINDINGS: Examination is markedly degraded due to patient's bed down state. The examination was performed on the fluoroscopy table with the patient positioned supine with head fluoroscopy table angled  approximately 25 degrees. Swallowing: Not well evaluated though definitive aspiration was not visualized. Pharynx: Not well evaluated Esophagus: Mild tortuosity and patulous distension of the esophagus without discrete stricture or mass on this single contrast examination. Esophageal motility: Moderate esophageal dysmotility with multiple tertiary contractions in transient escape to the level of the mid esophagus. Hiatal Hernia: Small hiatal hernia. Gastroesophageal reflux: None visualized. Ingested 13 mm barium tablet: Not given Other: None. IMPRESSION: 1. Limited single contrast esophagram demonstrates mild tortuosity and patulous distension of the esophagus without discrete stricture or mass. 2. Moderate esophageal dysmotility. 3. Small hiatal hernia. Electronically Signed   By: Norleen Roulette M.D.   On: 12/18/2023 14:54    Assessment: Jason A.  [name] is a 78 year old male with complex medical history to include stroke with residual right-sided hemiparesis, renal insufficiency, non-small cell lung cancer, adenocarcinoma of the parotid gland, MGUS with possible conversion to multiple myeloma, hypertension, DM, CKD, Barrett's esophagus, EOE, GERD, gastroparesis, asthma with recent admission for upper GI bleed in November 2024 with EGD showing grade B esophagitis, erythematous gastric mucosa, duodenal adenoma, now admitted with poor p.o. intake x 2 weeks in setting of dysphagia and vomiting.  Dysphagia/vomiting: New onset x 2 weeks.  Patient refusing p.o. intake initially. Likely driven by esophageal dysmotility.  Recent EGD as outlined above.  BPE on Monday with mild tortuosity and patulous distention of esophagus without discrete stricture or mass, moderate esophageal dysmotility, small lateral hernia.  SLP saw patient Monday,  he developed consumption of liquids, pure and regular textures.  Patient placed on full liquid diet and that was advanced yesterday as he was feeling better. Notes swallowing still  feels improved today but did not eat much due to nausea.   Abdominal pain: Chronic.  Primarily right-sided, present for months without change.CT A/P without contrast completed 1/4 again with no acute findings. He did have smooth wall thickening in the proximal sigmoid colon without adjacent inflammatory change, nonspecific with recommendations to consider elective outpatient colonoscopy, however in light of multiple morbidities, patient is not a good candidate for colonoscopy.  Additionally if this were malignant, treatment is likely not an option considering patient's frailty. Appreciate palliative input   Diarrhea: Etiology unclear.  C. difficile negative.  GI pathogen panel in process.  Could be secondary to overflow due to chronic constipation. Again, not an ideal candidiate for colonoscopy.   Acute on chronic anemia: Known IDA.  Hemoglobin 12.1 on admission, CBC since Monday with hemoglobin of 9.3 at that time. Will repeat CBC this morning.  No overt GI bleeding.  Likely some aspect of hemodilution.  Recent EGD as outlined above.  Last colonoscopy in 2012 with tubular adenoma.  Patient is not an ideal candidate for endoscopic evaluation, he was recommended for palliative involvement.  Will continue to monitor hemoglobin and for overt GI bleeding.   Plan: Repeat CBC Dysphagia 3 diet Continue PPI twice daily Follow GI pathogen panel result Monitor H&H, monitor for overt GI bleeding Appreciate palliative care input Continue supportive measures    LOS: 4 days    12/20/2023, 9:25 AM   Alyana Kreiter L. Akshaj Besancon, MSN, APRN, AGNP-C Adult-Gerontology Nurse Practitioner [facility] at [facility]

## 2023-12-20 NOTE — Plan of Care (Signed)
  Problem: Clinical Measurements: Goal: Diagnostic test results will improve Outcome: Progressing   Problem: Nutrition: Goal: Adequate nutrition will be maintained Outcome: Not Progressing

## 2023-12-20 NOTE — TOC Progression Note (Signed)
 Transition of Care [facility]) - Progression Note    Patient Details  Name: [name] MRN: [id number] Date of Birth: 05/28/1946  Transition of Care [facility]) CM/SW Contact  Noreen KATHEE [place], [place] Phone Number: 12/20/2023, 12:46 PM  Clinical Narrative:    Family had no preference for hospice services . Agreeable to having hospice . Rosina with [facility] came to speak with pt and spouse at bedside to explain Hospice care. In-house palliative had a conversation with patient and family yesterday regarding patient goals of care plans. Patient will DC back to [facility] once ready with Hospice care. MD will order. TOC will continue to follow.    Expected Discharge Plan: Skilled Nursing Facility [facility]) Barriers to Discharge: Continued Medical Work up  Expected Discharge Plan and Services In-house Referral: Clinical Social Work Discharge Planning Services: [employer] Consult Post Acute Care Choice: Horticulturist, Commercial, Nursing Home Living arrangements for the past 2 months: Skilled Nursing Facility                    Social Determinants of Health (SDOH) Interventions SDOH Screenings   Food Insecurity: No Food Insecurity (12/16/2023)  Housing: Unknown (12/17/2023)  Transportation Needs: No Transportation Needs (12/16/2023)  Utilities: Not At Risk (12/16/2023)  Alcohol Screen: Low Risk  (01/26/2021)  Depression (PHQ2-9): Low Risk  (09/21/2021)  Financial Resource Strain: Low Risk  (05/29/2020)  Social Connections: Patient Declined (12/17/2023)  Tobacco Use: Medium Risk (12/16/2023)    Readmission Risk Interventions    12/20/2023   12:46 PM 12/18/2023   10:38 AM 07/15/2022    2:16 PM  Readmission Risk Prevention Plan  Transportation Screening Complete Complete Complete  PCP or Specialist Appt within 5-7 Days   Complete  HRI or Home Care Consult  Complete   Social Work Consult for Recovery Care Planning/Counseling  Complete   Palliative Care Screening  Not Applicable   Medication Review Furniture Conservator/restorer) Complete Complete   HRI or Home Care Consult Complete    SW Recovery Care/Counseling Consult Complete    Palliative Care Screening Complete    Skilled Nursing Facility Complete

## 2023-12-20 NOTE — Progress Notes (Signed)
   12/19/23 2332  Assess: MEWS Score  Temp 98.2 F (36.8 C)  BP (!) 152/94  MAP (mmHg) 110  Pulse Rate (!) 120  Resp 17  SpO2 100 %  O2 Device Room Air  Assess: MEWS Score  MEWS Temp 0  MEWS Systolic 0  MEWS Pulse 2  MEWS RR 0  MEWS LOC 0  MEWS Score 2  MEWS Score Color Yellow  Assess: if the MEWS score is Yellow or Red  Were vital signs accurate and taken at a resting state? Yes  Does the patient meet 2 or more of the SIRS criteria? No  MEWS guidelines implemented  Yes, yellow  Treat  MEWS Interventions Considered administering scheduled or prn medications/treatments as ordered  Take Vital Signs  Increase Vital Sign Frequency  Yellow: Q2hr x1, continue Q4hrs until patient remains green for 12hrs  Escalate  MEWS: Escalate Yellow: Discuss with charge nurse and consider notifying provider and/or RRT  Notify: Charge Nurse/RN  Name of Charge Nurse/RN Notified Bree, RN  Assess: SIRS CRITERIA  SIRS Temperature  0  SIRS Respirations  0  SIRS Pulse 1  SIRS WBC 0  SIRS Score Sum  1

## 2023-12-21 DIAGNOSIS — E878 Other disorders of electrolyte and fluid balance, not elsewhere classified: Secondary | ICD-10-CM | POA: Diagnosis not present

## 2023-12-21 DIAGNOSIS — R933 Abnormal findings on diagnostic imaging of other parts of digestive tract: Secondary | ICD-10-CM

## 2023-12-21 DIAGNOSIS — R1310 Dysphagia, unspecified: Secondary | ICD-10-CM | POA: Diagnosis not present

## 2023-12-21 DIAGNOSIS — K449 Diaphragmatic hernia without obstruction or gangrene: Secondary | ICD-10-CM | POA: Diagnosis not present

## 2023-12-21 DIAGNOSIS — K224 Dyskinesia of esophagus: Secondary | ICD-10-CM | POA: Diagnosis not present

## 2023-12-21 DIAGNOSIS — R197 Diarrhea, unspecified: Secondary | ICD-10-CM | POA: Diagnosis not present

## 2023-12-21 LAB — CBC
HCT: 29.4 % — ABNORMAL LOW (ref 39.0–52.0)
Hemoglobin: 9.5 g/dL — ABNORMAL LOW (ref 13.0–17.0)
MCH: 22.7 pg — ABNORMAL LOW (ref 26.0–34.0)
MCHC: 32.3 g/dL (ref 30.0–36.0)
MCV: 70.2 fL — ABNORMAL LOW (ref 80.0–100.0)
Platelets: 150 10*3/uL (ref 150–400)
RBC: 4.19 MIL/uL — ABNORMAL LOW (ref 4.22–5.81)
RDW: 23.7 % — ABNORMAL HIGH (ref 11.5–15.5)
WBC: 10.5 10*3/uL (ref 4.0–10.5)
nRBC: 0 % (ref 0.0–0.2)

## 2023-12-21 LAB — BASIC METABOLIC PANEL
Anion gap: 7 (ref 5–15)
BUN: 17 mg/dL (ref 8–23)
CO2: 17 mmol/L — ABNORMAL LOW (ref 22–32)
Calcium: 7.8 mg/dL — ABNORMAL LOW (ref 8.9–10.3)
Chloride: 114 mmol/L — ABNORMAL HIGH (ref 98–111)
Creatinine, Ser: 1.72 mg/dL — ABNORMAL HIGH (ref 0.61–1.24)
GFR, Estimated: 40 mL/min — ABNORMAL LOW (ref >60)
Glucose, Bld: 193 mg/dL — ABNORMAL HIGH (ref 70–99)
Potassium: 3.3 mmol/L — ABNORMAL LOW (ref 3.5–5.1)
Sodium: 138 mmol/L (ref 135–145)

## 2023-12-21 LAB — GLUCOSE, CAPILLARY
Glucose-Capillary: 184 mg/dL — ABNORMAL HIGH (ref 70–99)
Glucose-Capillary: 183 mg/dL — ABNORMAL HIGH (ref 70–99)
Glucose-Capillary: 131 mg/dL — ABNORMAL HIGH (ref 70–99)
Glucose-Capillary: 137 mg/dL — ABNORMAL HIGH (ref 70–99)

## 2023-12-21 LAB — MAGNESIUM: Magnesium: 1.7 mg/dL (ref 1.7–2.4)

## 2023-12-21 LAB — PHOSPHORUS: Phosphorus: 2.5 mg/dL (ref 2.5–4.6)

## 2023-12-21 MED ORDER — POTASSIUM CHLORIDE 20 MEQ PO PACK
40.0000 meq | PACK | ORAL | Status: AC
  Administered 2023-12-21 (×2): 40 meq via ORAL
  Filled 2023-12-21 (×2): qty 2

## 2023-12-21 MED ORDER — DIPHENOXYLATE-ATROPINE 2.5-0.025 MG PO TABS: 1.0000 | ORAL_TABLET | Freq: Four times a day (QID) | ORAL | Status: DC | PRN

## 2023-12-21 MED ORDER — PANTOPRAZOLE SODIUM 40 MG PO TBEC
40.0000 mg | DELAYED_RELEASE_TABLET | Freq: Two times a day (BID) | ORAL | Status: DC
  Administered 2023-12-21 – 2023-12-22 (×2): 40 mg via ORAL
  Filled 2023-12-21 (×2): qty 1

## 2023-12-21 MED ORDER — FUROSEMIDE 20 MG PO TABS
20.0000 mg | ORAL_TABLET | Freq: Every day | ORAL | Status: DC
  Administered 2023-12-21 – 2023-12-22 (×2): 20 mg via ORAL
  Filled 2023-12-21 (×2): qty 1

## 2023-12-21 MED ORDER — ALLOPURINOL 100 MG PO TABS
150.0000 mg | ORAL_TABLET | Freq: Every day | ORAL | Status: DC
  Administered 2023-12-21 – 2023-12-22 (×2): 150 mg via ORAL
  Filled 2023-12-21 (×2): qty 2

## 2023-12-21 NOTE — Progress Notes (Signed)
   12/21/23 1050  Spiritual Encounters  Type of Visit Initial  Care provided to: Pt not available  OnCall Visit No   Chaplain attempted to visit with patient however he was receiving care at the time of my visit.   Carley [place] [facility]  [phone]

## 2023-12-21 NOTE — Consult Note (Signed)
 [facility] Liaison Consult Note   12/21/2023  IMRE VECCHIONE 1946/11/28 [id number]  Insurance: [employer] [facility] Liaison coverage for Olam Ku, RN HL with patient at [facility]  Primary Care Provider: System, Provider Not In.  Patient showing with [facility] banner with extreme high risk score, 4 day admission  Chart reviewed  for high risk needs reviewed Inpatient TOC LCSW notes and reveals the patient is currently a long term resident at [facility] and per palliative note they would like to return to [facility] with Hospice with this being the current plan reviewed. Noted patient listed as having a legal guardian.  Plan: Patient will have full care coordination services through Hospice and needs will be met at the hospice level of care. No planned follow up for transitional needs. Will update Lisa, HL, if findings.  Will sign off at transition from hospital.  For questions,    Richerd Fish, RN, BSN, CCM [place]  [facility], [facility] Liaison Direct Dial : [phone] or secure chat Email: [email or web address]

## 2023-12-21 NOTE — Progress Notes (Signed)
 Gastroenterology Progress Note   Referring Provider: No ref. provider found Primary Care Physician:  System, Provider Not In Primary Gastroenterologist:  Ozell Hollingshead, MD Patient ID: [name]; [id number]; Jun 25, 1946   Subjective:    Some loose stools in the setting of Linzess . Wife at bedside encouraging oral intake. Patient reluctant. States he has pain in the chest all the time and worse with swalloing. No abdominal pain today.   Objective:   Vital signs in last 24 hours: Temp:  [98.4 F (36.9 C)-98.7 F (37.1 C)] 98.4 F (36.9 C) (01/09 0502) Pulse Rate:  [87-104] 102 (01/09 0502) Resp:  [16-19] 17 (01/09 0502) BP: (136-148)/(86-95) 136/86 (01/09 0502) SpO2:  [100 %] 100 % (01/09 0502) Weight:  [101.4 kg] 101.4 kg (01/09 0502) Last BM Date : 12/20/23 General:   Alert, pleasant and cooperative in NAD. No evidence of oral thrush. Head:  Normocephalic and atraumatic. Eyes:  Sclera clear, no icterus.   Abdomen:  Soft, nontender and nondistended.  Normal bowel sounds, without guarding, and without rebound.     Skin:  Intact without significant lesions or rashes. Psych:  Alert and cooperative. Normal mood and affect.  Intake/Output from previous day: 01/08 0701 - 01/09 0700 In: 480 [P.O.:480] Out: 850 [Urine:850] Intake/Output this shift: Total I/O In: 200 [P.O.:200] Out: -   Lab Results: CBC Recent Labs    12/20/23 0406 12/21/23 0350  WBC 11.3* 10.5  HGB 10.4* 9.5*  HCT 33.6* 29.4*  MCV 72.4* 70.2*  PLT 165 150   BMET Recent Labs    12/19/23 0354 12/20/23 0406 12/21/23 0350  NA 137 136 138  K 3.2* 2.8* 3.3*  CL 112* 110 114*  CO2 19* 18* 17*  GLUCOSE 69* 113* 193*  BUN 15 16 17   CREATININE 1.64* 1.73* 1.72*  CALCIUM  8.3* 8.3* 7.8*   LFTs No results for input(s): BILITOT, BILIDIR, IBILI, ALKPHOS, AST, ALT, PROT, ALBUMIN  in the last 72 hours. No results for input(s): LIPASE in the last 72 hours. PT/INR No results for  input(s): LABPROT, INR in the last 72 hours.       Imaging Studies: DG ESOPHAGUS W DOUBLE CM (HD) Result Date: 12/18/2023 CLINICAL DATA:  Dysphagia. EXAM: ESOPHAGUS/BARIUM SWALLOW/TABLET STUDY TECHNIQUE: Single contrast examination was performed using water-soluble contrast and thin liquid barium. FLUOROSCOPY TIME:  1 minute, 24 seconds (7.3 mGy) COMPARISON:  None Available. FINDINGS: Examination is markedly degraded due to patient's bed down state. The examination was performed on the fluoroscopy table with the patient positioned supine with head fluoroscopy table angled approximately 25 degrees. Swallowing: Not well evaluated though definitive aspiration was not visualized. Pharynx: Not well evaluated Esophagus: Mild tortuosity and patulous distension of the esophagus without discrete stricture or mass on this single contrast examination. Esophageal motility: Moderate esophageal dysmotility with multiple tertiary contractions in transient escape to the level of the mid esophagus. Hiatal Hernia: Small hiatal hernia. Gastroesophageal reflux: None visualized. Ingested 13 mm barium tablet: Not given Other: None. IMPRESSION: 1. Limited single contrast esophagram demonstrates mild tortuosity and patulous distension of the esophagus without discrete stricture or mass. 2. Moderate esophageal dysmotility. 3. Small hiatal hernia. Electronically Signed   By: Norleen Roulette M.D.   On: 12/18/2023 14:54   NM Pulmonary Perfusion Result Date: 12/16/2023 CLINICAL DATA:  Difficulty breathing EXAM: NUCLEAR MEDICINE PERFUSION LUNG SCAN TECHNIQUE: Perfusion images were obtained in multiple projections after intravenous injection of radiopharmaceutical. Ventilation scans intentionally deferred if perfusion scan and chest x-ray adequate for interpretation during COVID  19 epidemic. RADIOPHARMACEUTICALS:  4.2 mCi Tc-92m MAA IV COMPARISON:  Chest x-ray from earlier in the same day. FINDINGS: Adequate uptake is noted throughout both  lungs. No focal defect to suggest pulmonary embolism is noted. IMPRESSION: No evidence of pulmonary embolus. Electronically Signed   By: Oneil Devonshire M.D.   On: 12/16/2023 18:18   US  Venous Img Lower Right (DVT Study) Result Date: 12/16/2023 CLINICAL DATA:  Right lower extremity edema.  Evaluate for DVT. EXAM: RIGHT LOWER EXTREMITY VENOUS DOPPLER ULTRASOUND TECHNIQUE: Gray-scale sonography with graded compression, as well as color Doppler and duplex ultrasound were performed to evaluate the lower extremity deep venous systems from the level of the common femoral vein and including the common femoral, femoral, profunda femoral, popliteal and calf veins including the posterior tibial, peroneal and gastrocnemius veins when visible. The superficial great saphenous vein was also interrogated. Spectral Doppler was utilized to evaluate flow at rest and with distal augmentation maneuvers in the common femoral, femoral and popliteal veins. COMPARISON:  Right lower extremity venous Doppler ultrasound-07/27/2018 FINDINGS: Contralateral Common Femoral Vein: There is mixed echogenic near occlusive thrombus within the contralateral left common femoral vein (images 3 through 6). Common Femoral Vein: No evidence of thrombus. Normal compressibility, respiratory phasicity and response to augmentation. Saphenofemoral Junction: No evidence of thrombus. Normal compressibility and flow on color Doppler imaging. Profunda Femoral Vein: No evidence of thrombus. Normal compressibility and flow on color Doppler imaging. Femoral Vein: No evidence of thrombus. Normal compressibility, respiratory phasicity and response to augmentation. Popliteal Vein: No evidence of thrombus. Normal compressibility, respiratory phasicity and response to augmentation. Calf Veins: Appear patent where visualized Superficial Great Saphenous Vein: Appears patent where visualized Other Findings: There is a moderate amount of subcutaneous edema at the level of the  right lower leg. IMPRESSION: 1. No definite evidence of DVT within right lower extremity. 2. Near occlusive thrombus within the contralateral left common femoral vein, presumably chronic in etiology though new compared to the 07/2018 examination. Clinical correlation is advised. Electronically Signed   By: Norleen Roulette M.D.   On: 12/16/2023 16:28   DG Chest Portable 1 View Result Date: 12/16/2023 CLINICAL DATA:  Abdominal pain, edema EXAM: PORTABLE CHEST - 1 VIEW COMPARISON:  CT 05/10/2023, and previous FINDINGS: Left apical bullous change with an area of bandlike consolidation in the upper lobe which is stable from prior study. No new infiltrate or overt edema. Heart size and mediastinal contours are within normal limits. No effusion. Advanced bilateral glenohumeral DJD. IMPRESSION: 1. No acute findings. 2. Left apical bullous change with stable bandlike consolidation. Electronically Signed   By: JONETTA Faes M.D.   On: 12/16/2023 14:56   CT ABDOMEN PELVIS WO CONTRAST Result Date: 12/16/2023 CLINICAL DATA:  Acute abdominal pain, history of gastric carcinoma EXAM: CT ABDOMEN AND PELVIS WITHOUT CONTRAST TECHNIQUE: Multidetector CT imaging of the abdomen and pelvis was performed following the standard protocol without IV contrast. RADIATION DOSE REDUCTION: This exam was performed according to the departmental dose-optimization program which includes automated exposure control, adjustment of the mA and/or kV according to patient size and/or use of iterative reconstruction technique. COMPARISON:  10/17/2023 FINDINGS: Lower chest: No pleural or pericardial effusion. Scattered coronary and aortic calcifications. Small hiatal hernia. Hepatobiliary: No focal liver abnormality is seen. Status post cholecystectomy. No biliary dilatation. Pancreas: Unremarkable. No pancreatic ductal dilatation or surrounding inflammatory changes. Spleen: Normal in size without focal abnormality. Adrenals/Urinary Tract: No adrenal mass. No  urolithiasis or hydronephrosis. Multiple cortical lesions in both kidneys,  some of which can be characterized as cysts, largest 4.9 cm 13 HU, left upper pole; no followup recommended. Urinary bladder partially distended. Stomach/Bowel: Small hiatal hernia. Stomach is nondistended, with old gastrostomy tract. Small bowel is decompressed. Normal appendix. The colon is partially distended, with multiple scattered diverticula. Segmental wall thickening is suspected in the proximal sigmoid colon, without adjacent inflammatory change. Vascular/Lymphatic: Moderate aortoiliac calcified atheromatous plaque without aneurysm. No abdominal or pelvic adenopathy. Reproductive: Prostate enlargement. Other: Right pelvic phleboliths.  No ascites.  No free air. Musculoskeletal: Post cement vertebral augmentation L2. Stable spondylitic changes L3-S1. IMPRESSION: 1. No acute findings. 2. Colonic diverticulosis. 3. Small hiatal hernia. 4. Smooth wall thickening in the proximal sigmoid colon without adjacent inflammatory change, nonspecific. Consider outpatient elective colonoscopic evaluation. 5.  Aortic Atherosclerosis (ICD10-I70.0). Electronically Signed   By: JONETTA Faes M.D.   On: 12/16/2023 14:51  [2 weeks]  Assessment/Plan:   [name] is a 78 year old male with complex medical history to include stroke with residual right-sided hemiparesis, renal insufficiency, non-small cell lung cancer, adenocarcinoma of the parotid gland, MGUS with possible conversion to multiple myeloma, hypertension, DM, CKD, Barrett's esophagus, EOE, GERD, gastroparesis, asthma with recent admission for upper GI bleed in November 2024 with EGD showing grade B esophagitis, erythematous gastric mucosa, duodenal adenoma, now admitted with poor p.o. intake x 2 weeks in setting of dysphagia and vomiting.   Dysphagia/vomiting: -EGD 10/2023 with grade B esophagitis -BPE Monday with mild tortuosity and patulous distention of esophagus without  discrete stricture or mass, moderate esophageal dysmotility, small hiatal hernia -SLP evaluation Monday, patient okay to consume regular textures and thin liquids from a oropharyngeal standpoint -Symptoms likely multifactorial, EGD not likely to change management and not recommended at this time, patient wishes to avoid invasive procedures if at all possible -Continue PPI twice daily. -continue sucralfate  short-term, two weeks -Focus foods on things that he tolerates and enjoys, upright with meals and medication administration  Abdominal pain: Chronic -CT abdomen pelvis without contrast January 4 without acute findings.  Smooth wall thickening in the proximal sigmoid colon without adjacent inflammatory change, nonspecific with recommendations to consider elective outpatient colonoscopy, however in light of multiple comorbidities, patient not a good candidate for colonoscopy.  Additionally if there were malignancy, treatment not likely an option considering patient's frailty.  Diarrhea: -C. difficile antigen positive, C. difficile toxin negative, C. difficile by PCR negative  -GI pathogen panel negative -Having 1-2 loose stools daily, however patient receiving Linzess  the last 2 days.  If persistent diarrhea, consider rechecking C. difficile  Acute on chronic anemia: Known IDA -Hgb 12.1-->9.3-->9.5 -No overt GI bleeding -Recent EGD as outlined, not an ideal candidate for colonoscopy -Monitor for overt GI bleeding  Appreciate palliative involvement.  Patient agreed to hospice services.  GI to follow peripherally.   LOS: 5 days   Jason RAMAN. Ezzard RIGGERS [facility] [phone] 1/9/202511:23 AM

## 2023-12-21 NOTE — Progress Notes (Signed)
 PROGRESS NOTE    GENARO [name]  FMW:[id number] DOB: 1946-06-04 DOA: 12/16/2023 PCP: System, Provider Not In  Chief Complaint  Patient presents with   Abdominal Pain   Emesis   Nausea    Brief Narrative:   Patient is [name] 78 year old male with history of CVA/right-sided residual hemiparesis, non-small cell lung cancer s/p radiation therapy, MGUS with possible conversion to multiple myeloma, not felt to be [place] candidate for additional treatment, CKD stage IIIb-4, hypertension, diabetes type 2, gastroparesis esophagitis, upper GI bleed, chronically debilitated/ bedbound who was brought from nursing facility for the evaluation of nausea, vomiting, abdominal pain, inability to take p.o.  Report of feeling of food getting stuck in his upper chest .Found to be very weak and deconditioned on presentation.  Lab work showed severe electrolyte abnormalities with severe hypokalemia, hypomagnesemia, hypophosphatemia.  GI, speech therapy consulted.  Assessment & Plan:   Principal Problem:   Electrolyte and fluid disorder Active Problems:   Type 2 diabetes mellitus (HCC)   Gout   BARRETTS ESOPHAGUS   Diarrhea   Hyperlipidemia   AKI (acute kidney injury) (HCC)   Gastritis and gastroduodenitis   Duodenal adenoma   Dehydration   Vomiting   Dysphagia  Goals of Care Appreciate palliative's assistance.  Plan for discharge back to LTC with hospice.   Dysphagia Odynophagia  Severe dehydration Presentation seems rather acute, per discussion with wife/patient, symptoms began about 2 weeks ago (prior to that was taking PO normally).  Presented with n/v.  C/o odynophagia today. Esophagram with tortuosity and patulous distension of the esophagus without discrete stricture or mass, moderate esophageal dysmotility Upper endoscopy 10/2023 with grade B reflux esophagitis without bleeding, erythematous mucosa in stomach which was biopsied Appreciate GI assistance, they suspect repeat EGD not likely to change  management and is not recommended.  Also, patient wished to avoid invasive procedures.  Recommending PPI BID.  Upright with meals and meds (up in chair if possible).  Foods and liquids he enjoys and can tolerate.   Hypokalemia Hypophosphatemia Hypomagnesemia Replace as indicated, still notable hypokalemia   AKI on CKD stage IIIa-4: Baseline creatinine around 2.  Currently kidney function better than recent baseline.   Elevated troponin: Flat trend.  Denies chest pain.  Likely demand ischemia.   Hyperlipidemia: On statin   BPH: On Flomax    History of esophagitis: Continue PPI   Hypertension: Takes amlodipine , Lasix  , metoprolol .  We will resume    History of lung cancer/MGUS: Will recommend follow-up with oncology as an outpatient.   Insulin -dependent diabetes type 2: Continue current insulin  regimen.  Monitor blood sugars  Femoral Central Line - will d/c today, he'll be without Willet Schleifer line with plan to discharge back to [facility] with hospice tmrw  Chronic DVT - discussed with vascular, noted chronic, only recommended aspirin  which he's already on     DVT prophylaxis: heparin  Code Status: DNR Family Communication: wife, son, nieces at bedside Disposition:   Status is: Inpatient Remains inpatient appropriate because: need for continued inpatient care   Consultants:  GI palliative  Procedures:  none  Antimicrobials:  Anti-infectives (From admission, onward)    None       Subjective: Afraid of throwing up with eating Understands plan for [facility] with hospice  Objective: Vitals:   12/20/23 0348 12/20/23 1320 12/20/23 2033 12/21/23 0502  BP: (!) 148/95 (!) 144/91 (!) 148/95 136/86  Pulse: (!) 109 87 (!) 104 (!) 102  Resp: 17 19 16 17   Temp: 98.2 F (  36.8 C)  98.7 F (37.1 C) 98.4 F (36.9 C)  TempSrc: Oral  Oral Oral  SpO2: 98% 100% 100% 100%  Weight: 105 kg   101.4 kg  Height:        Intake/Output Summary (Last 24 hours) at 12/21/2023  1102 Last data filed at 12/21/2023 0834 Gross per 24 hour  Intake 440 ml  Output 850 ml  Net -410 ml   Filed Weights   12/19/23 0300 12/20/23 0348 12/21/23 0502  Weight: 97.2 kg 105 kg 101.4 kg    Examination:  General: No acute distress. Cardiovascular: RRR Lungs: unlabored Neurological: Alert, right sided weakness Skin: Warm and dry. No rashes or lesions. Extremities: anasarca    Data Reviewed: I have personally reviewed following labs and imaging studies  CBC: Recent Labs  Lab 12/16/23 1355 12/17/23 2210 12/18/23 0828 12/20/23 0406 12/21/23 0350  WBC 15.7* 14.3* 13.2* 11.3* 10.5  NEUTROABS 14.6* 12.6* 11.4*  --   --   HGB 12.1* 10.4* 9.3* 10.4* 9.5*  HCT 41.0 34.3* 30.5* 33.6* 29.4*  MCV 74.0* 72.2* 71.9* 72.4* 70.2*  PLT 256 215 180 165 150    Basic Metabolic Panel: Recent Labs  Lab 12/17/23 1253 12/17/23 2209 12/18/23 0828 12/18/23 1557 12/19/23 0354 12/20/23 0406 12/21/23 0350  NA 149*  --  140  --  137 136 138  K 2.3*   < > 2.6* 3.0* 3.2* 2.8* 3.3*  CL 119*  --  114*  --  112* 110 114*  CO2 22  --  23  --  19* 18* 17*  GLUCOSE 91  --  139*  --  69* 113* 193*  BUN 20  --  16  --  15 16 17   CREATININE 2.10*  --  1.86*  --  1.64* 1.73* 1.72*  CALCIUM  9.0  --  8.2*  --  8.3* 8.3* 7.8*  MG 1.6*  --  1.7  --  2.1 1.9 1.7  PHOS 2.0*  --  1.3*  --  1.5* 2.9 2.5   < > = values in this interval not displayed.    GFR: Estimated Creatinine Clearance: 44.3 mL/min (Malli Falotico) (by C-G formula based on SCr of 1.72 mg/dL (H)).  Liver Function Tests: Recent Labs  Lab 12/16/23 1355  AST 22  ALT 14  ALKPHOS 77  BILITOT 0.7  PROT 7.3  ALBUMIN  3.1*    CBG: Recent Labs  Lab 12/20/23 0744 12/20/23 1110 12/20/23 1643 12/20/23 2024 12/21/23 0718  GLUCAP 147* 135* 165* 116* 184*     Recent Results (from the past 240 hours)  MRSA Next Gen by PCR, Nasal     Status: None   Collection Time: 12/17/23  5:21 AM   Specimen: Nasal Mucosa; Nasal Swab  Result  Value Ref Range Status   MRSA by PCR Next Gen NOT DETECTED NOT DETECTED Final    Comment: (NOTE) The GeneXpert MRSA Assay (FDA approved for NASAL specimens only), is one component of Nathalie Cavendish comprehensive MRSA colonization surveillance program. It is not intended to diagnose MRSA infection nor to guide or monitor treatment for MRSA infections. Test performance is not FDA approved in patients less than 3 years old. Performed at [facility], [address], [place], [place] [address]   C Difficile Quick Screen w PCR reflex     Status: Abnormal   Collection Time: 12/17/23  3:16 PM   Specimen: STOOL  Result Value Ref Range Status   C Diff antigen POSITIVE (Anthoney Sheppard) NEGATIVE Final  C Diff toxin NEGATIVE NEGATIVE Final   C Diff interpretation Results are indeterminate. See PCR results.  Final    Comment: Performed at [facility], [address], [place], [place] [address]  C. Diff by PCR, Reflexed     Status: None   Collection Time: 12/17/23  3:16 PM  Result Value Ref Range Status   Toxigenic C. Difficile by PCR NEGATIVE NEGATIVE Final    Comment: Patient is colonized with non toxigenic C. difficile. May not need treatment unless significant symptoms are present. Performed at [facility], [address] [place], [place] [address]   Gastrointestinal Panel by PCR , Stool     Status: None   Collection Time: 12/19/23 11:30 AM   Specimen: Stool  Result Value Ref Range Status   Campylobacter species NOT DETECTED NOT DETECTED Final   Plesimonas shigelloides NOT DETECTED NOT DETECTED Final   Salmonella species NOT DETECTED NOT DETECTED Final   Yersinia enterocolitica NOT DETECTED NOT DETECTED Final   Vibrio species NOT DETECTED NOT DETECTED Final   Vibrio cholerae NOT DETECTED NOT DETECTED Final   Enteroaggregative E coli (EAEC) NOT DETECTED NOT DETECTED Final   Enteropathogenic E coli (EPEC) NOT DETECTED NOT DETECTED Final   Enterotoxigenic E coli (ETEC) NOT DETECTED NOT DETECTED Final   Shiga  like toxin producing E coli (STEC) NOT DETECTED NOT DETECTED Final   Shigella/Enteroinvasive E coli (EIEC) NOT DETECTED NOT DETECTED Final   Cryptosporidium NOT DETECTED NOT DETECTED Final   Cyclospora cayetanensis NOT DETECTED NOT DETECTED Final   Entamoeba histolytica NOT DETECTED NOT DETECTED Final   Giardia lamblia NOT DETECTED NOT DETECTED Final   Adenovirus F40/41 NOT DETECTED NOT DETECTED Final   Astrovirus NOT DETECTED NOT DETECTED Final   Norovirus GI/GII NOT DETECTED NOT DETECTED Final   Rotavirus Tye Vigo NOT DETECTED NOT DETECTED Final   Sapovirus (I, II, IV, and V) NOT DETECTED NOT DETECTED Final    Comment: Performed at [facility], [address], [place], [place] [address]         Radiology Studies: No results found.      Scheduled Meds:  allopurinol   150 mg Oral Daily   amLODipine   10 mg Oral Daily   aspirin  EC  81 mg Oral Daily   buPROPion  ER  100 mg Oral BID   Chlorhexidine  Gluconate Cloth  6 each Topical Daily   feeding supplement  237 mL Oral TID BM   furosemide   20 mg Oral Daily   gabapentin   300 mg Oral BID   heparin   5,000 Units Subcutaneous Q8H   insulin  aspart  0-6 Units Subcutaneous TID WC   linaclotide   145 mcg Oral QAC breakfast   metoprolol  tartrate  50 mg Oral Daily   multivitamin  15 mL Oral Q supper   pantoprazole  (PROTONIX ) IV  40 mg Intravenous Q12H   phosphorus  500 mg Oral TID   potassium chloride   40 mEq Oral Q4H   rosuvastatin   20 mg Oral QPM   sodium chloride  flush  10-40 mL Intracatheter Q12H   sodium chloride  flush  3 mL Intravenous Q12H   sucralfate   1 g Oral TID WC & HS   tamsulosin   0.4 mg Oral QPM   Continuous Infusions:   LOS: 5 days    Time spent: over 30 min    Meliton Monte, MD [facility]   To contact the attending provider between 7A-7P or the covering provider during after hours 7P-7A, please log into  the web site [email or web address] and access using universal [place] password for that web  site. If you do not have the password, please call the hospital operator.  12/21/2023, 11:02 AM

## 2023-12-21 NOTE — Plan of Care (Signed)

## 2023-12-21 NOTE — Plan of Care (Signed)
  Problem: Coping: Goal: Ability to adjust to condition or change in health will improve Outcome: Progressing   Problem: Skin Integrity: Goal: Risk for impaired skin integrity will decrease Outcome: Not Progressing

## 2023-12-21 NOTE — Progress Notes (Signed)
 Femoral line removed per MD order, MD aware that patient will not have another IV access, family made aware by MD. Patient tolerated removal well.

## 2023-12-21 NOTE — Progress Notes (Signed)
 [facility] Liaison Note   [facility] continues to follow for hospice services at facility after discharge likely tomorrow.    Please call with any hospice related questions or concerns.  Elouise Husband, BSN, RN, [facility] [phone]

## 2023-12-22 LAB — CBC
HCT: 32 % — ABNORMAL LOW (ref 39.0–52.0)
Hemoglobin: 10 g/dL — ABNORMAL LOW (ref 13.0–17.0)
MCH: 22.4 pg — ABNORMAL LOW (ref 26.0–34.0)
MCHC: 31.3 g/dL (ref 30.0–36.0)
MCV: 71.6 fL — ABNORMAL LOW (ref 80.0–100.0)
Platelets: 181 10*3/uL (ref 150–400)
RBC: 4.47 MIL/uL (ref 4.22–5.81)
RDW: 24.2 % — ABNORMAL HIGH (ref 11.5–15.5)
WBC: 11.7 10*3/uL — ABNORMAL HIGH (ref 4.0–10.5)
nRBC: 0 % (ref 0.0–0.2)

## 2023-12-22 LAB — BASIC METABOLIC PANEL
Anion gap: 5 (ref 5–15)
BUN: 18 mg/dL (ref 8–23)
CO2: 19 mmol/L — ABNORMAL LOW (ref 22–32)
Calcium: 8 mg/dL — ABNORMAL LOW (ref 8.9–10.3)
Chloride: 112 mmol/L — ABNORMAL HIGH (ref 98–111)
Creatinine, Ser: 1.67 mg/dL — ABNORMAL HIGH (ref 0.61–1.24)
GFR, Estimated: 42 mL/min — ABNORMAL LOW (ref >60)
Glucose, Bld: 131 mg/dL — ABNORMAL HIGH (ref 70–99)
Potassium: 3.3 mmol/L — ABNORMAL LOW (ref 3.5–5.1)
Sodium: 136 mmol/L (ref 135–145)

## 2023-12-22 LAB — GLUCOSE, CAPILLARY
Glucose-Capillary: 120 mg/dL — ABNORMAL HIGH (ref 70–99)
Glucose-Capillary: 157 mg/dL — ABNORMAL HIGH (ref 70–99)

## 2023-12-22 LAB — PHOSPHORUS: Phosphorus: 2.1 mg/dL — ABNORMAL LOW (ref 2.5–4.6)

## 2023-12-22 MED ORDER — POTASSIUM CHLORIDE 20 MEQ PO PACK: 40.0000 meq | PACK | Freq: Every day | ORAL | Status: DC

## 2023-12-22 MED ORDER — ALUM & MAG HYDROXIDE-SIMETH 200-200-20 MG/5ML PO SUSP
30.0000 mL | Freq: Once | ORAL | Status: AC
  Administered 2023-12-22: 30 mL via ORAL
  Filled 2023-12-22: qty 30

## 2023-12-22 MED ORDER — ALLOPURINOL 300 MG PO TABS: 150.0000 mg | ORAL_TABLET | Freq: Every day | ORAL | Status: AC

## 2023-12-22 MED ORDER — BACLOFEN 5 MG PO TABS: 1.0000 | ORAL_TABLET | Freq: Three times a day (TID) | ORAL | Status: DC | PRN

## 2023-12-22 MED ORDER — GABAPENTIN 300 MG PO CAPS: 300.0000 mg | ORAL_CAPSULE | Freq: Two times a day (BID) | ORAL | Status: AC

## 2023-12-22 MED ORDER — POTASSIUM CHLORIDE 20 MEQ PO PACK
40.0000 meq | PACK | ORAL | Status: AC
  Administered 2023-12-22 (×2): 40 meq via ORAL
  Filled 2023-12-22 (×2): qty 2

## 2023-12-22 MED ORDER — INSULIN ASPART 100 UNIT/ML IJ SOLN: 0.0000 [IU] | Freq: Three times a day (TID) | INTRAMUSCULAR | Status: DC

## 2023-12-22 MED ORDER — SUCRALFATE 1 GM/10ML PO SUSP: 1.0000 g | Freq: Three times a day (TID) | ORAL | Status: DC

## 2023-12-22 NOTE — Consult Note (Signed)
 [facility] Liaison Note  12/22/2023  [name] 04/20/1946 [id number]  Location: RN Hospital Liaison screened the patient remotely at [facility].  Insurance: [employer]   [name] is a 78 y.o. male who is a Primary Care Patient of System, Provider Not In The patient was screened for  readmission hospitalization with noted extreme risk score for unplanned readmission risk with 2 IP in 6 months.  The patient was assessed for potential Care Management service needs for post hospital transition for care coordination. Review of patient's electronic medical record reveals patient Electrolyte and Fluid disorder. Pt return to [facility] with hospice services. Facility will continue to address pt's needs.   [facility] Care Management/Population Health does not replace or interfere with any arrangements made by the Inpatient Transition of Care team.   For questions contact:   Jason Ku, RN, [facility] Liaison [name]   [facility], Population Health Office Hours MTWF  8:00 am-6:00 pm Direct Dial : [phone] mobile [phone] [Office toll free line] Office Hours are M-F 8:30 - 5 pm [email or web address]

## 2023-12-22 NOTE — Progress Notes (Signed)
 Attempted to call report to receiving facility and was on hold for 10+ minutes. Call disconnected.

## 2023-12-22 NOTE — Discharge Summary (Signed)
 Physician Discharge Summary  [name] FMW:[id number] DOB: 1946/10/07 DOA: 12/16/2023  PCP: System, Provider Not In  Admit date: 12/16/2023 Discharge date: 12/22/2023  Time spent: 40 minutes  Recommendations for Outpatient Follow-up:  Plan for discharge to [facility] with hospice Symptom management per hospice    Patient should be upright when taking medicines or eating/drinking  Discharge Diagnoses:  Principal Problem:   Electrolyte and fluid disorder Active Problems:   Type 2 diabetes mellitus (HCC)   Gout   BARRETTS ESOPHAGUS   Diarrhea   Hyperlipidemia   AKI (acute kidney injury) (HCC)   Gastritis and gastroduodenitis   Duodenal adenoma   Dehydration   Vomiting   Dysphagia   Discharge Condition: stable  Diet recommendation: as tolerated.    Filed Weights   12/19/23 0300 12/20/23 0348 12/21/23 0502  Weight: 97.2 kg 105 kg 101.4 kg    History of present illness:   78 year old male with history of CVA/right-sided residual hemiparesis, non-small cell lung cancer s/p radiation therapy, MGUS with possible conversion to multiple myeloma, not felt to be [name] candidate for additional treatment, CKD stage IIIb-4, hypertension, diabetes type 2, gastroparesis esophagitis, upper GI bleed, chronically debilitated/ bedbound who was brought from nursing facility for the evaluation of nausea, vomiting, abdominal pain, inability to take p.o. Report of feeling of food getting stuck in his upper chest .Found to be very weak and deconditioned on presentation. Lab work showed severe electrolyte abnormalities with severe hypokalemia, hypomagnesemia, hypophosphatemia. GI, speech therapy consulted.  Hospital Course:  Assessment and Plan:  Goals of Care Appreciate palliative's assistance.  Plan for discharge back to LTC with hospice.  He complained of abdominal discomfort today, I discussed we could repeat imaging and work this up additionally, but he preferred to discharge to [facility]  with plans for hospice and symptom management, he declined additional workup/management here.   Dysphagia Odynophagia  Severe dehydration Presentation seems rather acute, per discussion with wife/patient, symptoms began about 2 weeks ago (prior to that was taking PO normally).  Presented with n/v.  C/o odynophagia today. Esophagram with tortuosity and patulous distension of the esophagus without discrete stricture or mass, moderate esophageal dysmotility Upper endoscopy 10/2023 with grade B reflux esophagitis without bleeding, erythematous mucosa in stomach which was biopsied Appreciate GI assistance, they suspect repeat EGD not likely to change management and is not recommended.  Also, patient wished to avoid invasive procedures.  Recommending PPI BID.  Sucralfate  x2 weeks.  Upright with meals and meds (up in chair if possible).  Foods and liquids he enjoys and can tolerate.  GI 1/9 notes known dudoenal adenoma, possible malignant foci?   Hypokalemia Hypophosphatemia Hypomagnesemia Replace as indicated, still notable hypokalemia Discharge with daily potassium   AKI on CKD stage IIIa-4: Baseline creatinine around 2.  Currently kidney function better than recent baseline.   Elevated troponin: Flat trend.  Denies chest pain.  Likely demand ischemia.   Hyperlipidemia: On statin   BPH: On Flomax    History of esophagitis: Continue PPI   Hypertension: Takes amlodipine , Lasix  , metoprolol .  We will resume    History of lung cancer/MGUS: Will recommend follow-up with oncology as an outpatient.  He's now on hospice.   Insulin -dependent diabetes type 2: Continue current insulin  regimen.  Monitor blood sugars   Femoral Central Line - discontinued  Chronic DVT - discussed with vascular, noted chronic, only recommended aspirin  which he's already on        Procedures: LE US  IMPRESSION: 1. No definite  evidence of DVT within right lower extremity. 2. Near occlusive thrombus within the  contralateral left common femoral vein, presumably chronic in etiology though new compared to the 07/2018 examination. Clinical correlation is advised.   Consultations: GI palliative  Discharge Exam: Vitals:   12/22/23 0908 12/22/23 1033  BP: (!) 135/92 (!) 144/88  Pulse: (!) 112 99  Resp: 18 18  Temp:    SpO2:  100%   C/o R abdominal discomfort/chest discomfort Seemed improved with making him more upright Discussed we could workup symptoms additionally with more imaging, but he desired to go to [facility] with hospice and symptom management.  Wife was at bedside.  General: No acute distress. Cardiovascular: RRR Lungs: unlabored Abdomen: RUQ abdominal discomfort, not particularly TTP Neurological: Alert and oriented 3. R sided weakness Extremities: LE edema  Discharge Instructions   Discharge Instructions     Diet - low sodium heart healthy   Complete by: As directed    Discharge instructions   Complete by: As directed    You were seen for poor PO intake and inability to tolerate PO as well as abdominal pain.   You've been seen by GI.  You've had an esophagram which showed distension of the esophagus and esophageal dysmotility.  An EGD was not considered likely to change management and not recommended, you also did not desire any invasive procedures.  We'll recommend 2 weeks of sucralfate  and protonix  twice [name] day.  Prioritize the foods that you enjoy and tolerate.  You should be upright with meals and medications.  We're discharging you with plans for hospice to follow at [facility].  Return for new, recurrent, or worsening concerns.  Please ask your PCP to request records from this hospitalization so they know what was done and what the next steps will be.   Increase activity slowly   Complete by: As directed       Allergies as of 12/22/2023   No Known Allergies      Medication List     STOP taking these medications    doxycycline  100 MG  tablet Commonly known as: VIBRA -TABS   gabapentin  600 MG tablet Commonly known as: NEURONTIN  Replaced by: gabapentin  300 MG capsule   insulin  lispro 100 UNIT/ML KwikPen Commonly known as: HumaLOG  KwikPen   Lantus  SoloStar 100 UNIT/ML Solostar Pen Generic drug: insulin  glargine   Potassium Chloride  ER 20 MEQ Tbcr Replaced by: potassium chloride  20 MEQ packet       TAKE these medications    acetaminophen  650 MG CR tablet Commonly known as: TYLENOL  Take 650 mg by mouth every 4 (four) hours as needed for pain.   allopurinol  300 MG tablet Commonly known as: ZYLOPRIM  Take 0.5 tablets (150 mg total) by mouth daily. Start taking on: December 23, 2023 What changed:  how much to take how to take this when to take this additional instructions   amLODipine  10 MG tablet Commonly known as: NORVASC  Take 1 tablet (10 mg total) by mouth daily.   Aspirin  Adult Low Strength 81 MG tablet Generic drug: aspirin  EC Take 1 tablet (81 mg total) by mouth daily. Restart in 1 week   b complex-vitamin c-folic acid 0.8 MG Tabs tablet Take 1 tablet by mouth at bedtime.   Baclofen  5 MG Tabs Take 1 tablet (5 mg total) by mouth 3 (three) times daily as needed. What changed:  when to take this reasons to take this   bisacodyl  10 MG suppository Commonly known as: DULCOLAX Place 10  mg rectally daily as needed for moderate constipation.   buPROPion  ER 100 MG 12 hr tablet Commonly known as: WELLBUTRIN  SR Take 100 mg by mouth 2 (two) times daily.   cinacalcet  30 MG tablet Commonly known as: SENSIPAR  Take 30 mg by mouth daily with breakfast.   cyanocobalamin  1000 MCG tablet Commonly known as: VITAMIN B12 Take 1 tablet (1,000 mcg total) by mouth daily.   furosemide  20 MG tablet Commonly known as: LASIX  Take 1 tablet (20 mg total) by mouth daily.   gabapentin  300 MG capsule Commonly known as: NEURONTIN  Take 1 capsule (300 mg total) by mouth 2 (two) times daily. Replaces: gabapentin  600  MG tablet   insulin  aspart 100 UNIT/ML injection Commonly known as: novoLOG  Inject 0-6 Units into the skin 3 (three) times daily with meals. CBG 70 - 120: 0 units  CBG 121 - 150: 0 units  CBG 151 - 200: 1 unit  CBG 201-250: 2 units  CBG 251-300: 3 units  CBG 301-350: 4 units  CBG 351-400: 5 units  Give 6 units and Call MD if > 400   ipratropium-albuterol  0.5-2.5 (3) MG/3ML Soln Commonly known as: DUONEB Take 3 mLs by nebulization every 6 (six) hours.   iron  polysaccharides 150 MG capsule Commonly known as: NIFEREX TAKE 1 CAPSULE(150 MG) BY MOUTH DAILY What changed: See the new instructions.   metoprolol  tartrate 50 MG tablet Commonly known as: LOPRESSOR  Take 50 mg by mouth daily.   Motegrity  2 MG Tabs Generic drug: Prucalopride Succinate  Take 1 tablet (2 mg total) by mouth daily.   ondansetron  4 MG disintegrating tablet Commonly known as: ZOFRAN -ODT Take 4 mg by mouth every 6 (six) hours as needed for nausea or vomiting.   oxyCODONE -acetaminophen  7.5-325 MG tablet Commonly known as: PERCOCET Take 1 tablet by mouth in the morning and at bedtime.   pantoprazole  40 MG tablet Commonly known as: PROTONIX  Take 1 tablet (40 mg total) by mouth 2 (two) times daily.   polyethylene glycol powder 17 GM/SCOOP powder Commonly known as: MiraLax  Take one capful daily. Hold for diarrhea. What changed:  how much to take how to take this when to take this additional instructions   potassium chloride  20 MEQ packet Commonly known as: KLOR-CON  Take 40 mEq by mouth daily. Replaces: Potassium Chloride  ER 20 MEQ Tbcr   rosuvastatin  20 MG tablet Commonly known as: CRESTOR  Take 20 mg by mouth daily.   sucralfate  1 GM/10ML suspension Commonly known as: CARAFATE  Take 10 mLs (1 g total) by mouth 4 (four) times daily -  with meals and at bedtime for 14 days.   tamsulosin  0.4 MG Caps capsule Commonly known as: FLOMAX  TAKE 1 CAPSULE(0.4 MG) BY MOUTH DAILY What changed:  how much to  take how to take this when to take this additional instructions   traZODone  100 MG tablet Commonly known as: DESYREL  Take 100 mg by mouth at bedtime.       No Known Allergies    The results of significant diagnostics from this hospitalization (including imaging, microbiology, ancillary and laboratory) are listed below for reference.    Significant Diagnostic Studies: DG ESOPHAGUS W DOUBLE CM (HD) Result Date: 12/18/2023 CLINICAL DATA:  Dysphagia. EXAM: ESOPHAGUS/BARIUM SWALLOW/TABLET STUDY TECHNIQUE: Single contrast examination was performed using water-soluble contrast and thin liquid barium. FLUOROSCOPY TIME:  1 minute, 24 seconds (7.3 mGy) COMPARISON:  None Available. FINDINGS: Examination is markedly degraded due to patient's bed down state. The examination was performed on the fluoroscopy table with the  patient positioned supine with head fluoroscopy table angled approximately 25 degrees. Swallowing: Not well evaluated though definitive aspiration was not visualized. Pharynx: Not well evaluated Esophagus: Mild tortuosity and patulous distension of the esophagus without discrete stricture or mass on this single contrast examination. Esophageal motility: Moderate esophageal dysmotility with multiple tertiary contractions in transient escape to the level of the mid esophagus. Hiatal Hernia: Small hiatal hernia. Gastroesophageal reflux: None visualized. Ingested 13 mm barium tablet: Not given Other: None. IMPRESSION: 1. Limited single contrast esophagram demonstrates mild tortuosity and patulous distension of the esophagus without discrete stricture or mass. 2. Moderate esophageal dysmotility. 3. Small hiatal hernia. Electronically Signed   By: Norleen Roulette M.D.   On: 12/18/2023 14:54   NM Pulmonary Perfusion Result Date: 12/16/2023 CLINICAL DATA:  Difficulty breathing EXAM: NUCLEAR MEDICINE PERFUSION LUNG SCAN TECHNIQUE: Perfusion images were obtained in multiple projections after intravenous  injection of radiopharmaceutical. Ventilation scans intentionally deferred if perfusion scan and chest x-ray adequate for interpretation during COVID 19 epidemic. RADIOPHARMACEUTICALS:  4.2 mCi Tc-44m MAA IV COMPARISON:  Chest x-ray from earlier in the same day. FINDINGS: Adequate uptake is noted throughout both lungs. No focal defect to suggest pulmonary embolism is noted. IMPRESSION: No evidence of pulmonary embolus. Electronically Signed   By: Oneil Devonshire M.D.   On: 12/16/2023 18:18   US  Venous Img Lower Right (DVT Study) Result Date: 12/16/2023 CLINICAL DATA:  Right lower extremity edema.  Evaluate for DVT. EXAM: RIGHT LOWER EXTREMITY VENOUS DOPPLER ULTRASOUND TECHNIQUE: Gray-scale sonography with graded compression, as well as color Doppler and duplex ultrasound were performed to evaluate the lower extremity deep venous systems from the level of the common femoral vein and including the common femoral, femoral, profunda femoral, popliteal and calf veins including the posterior tibial, peroneal and gastrocnemius veins when visible. The superficial great saphenous vein was also interrogated. Spectral Doppler was utilized to evaluate flow at rest and with distal augmentation maneuvers in the common femoral, femoral and popliteal veins. COMPARISON:  Right lower extremity venous Doppler ultrasound-07/27/2018 FINDINGS: Contralateral Common Femoral Vein: There is mixed echogenic near occlusive thrombus within the contralateral left common femoral vein (images 3 through 6). Common Femoral Vein: No evidence of thrombus. Normal compressibility, respiratory phasicity and response to augmentation. Saphenofemoral Junction: No evidence of thrombus. Normal compressibility and flow on color Doppler imaging. Profunda Femoral Vein: No evidence of thrombus. Normal compressibility and flow on color Doppler imaging. Femoral Vein: No evidence of thrombus. Normal compressibility, respiratory phasicity and response to augmentation.  Popliteal Vein: No evidence of thrombus. Normal compressibility, respiratory phasicity and response to augmentation. Calf Veins: Appear patent where visualized Superficial Great Saphenous Vein: Appears patent where visualized Other Findings: There is Mannat Benedetti moderate amount of subcutaneous edema at the level of the right lower leg. IMPRESSION: 1. No definite evidence of DVT within right lower extremity. 2. Near occlusive thrombus within the contralateral left common femoral vein, presumably chronic in etiology though new compared to the 07/2018 examination. Clinical correlation is advised. Electronically Signed   By: Norleen Roulette M.D.   On: 12/16/2023 16:28   DG Chest Portable 1 View Result Date: 12/16/2023 CLINICAL DATA:  Abdominal pain, edema EXAM: PORTABLE CHEST - 1 VIEW COMPARISON:  CT 05/10/2023, and previous FINDINGS: Left apical bullous change with an area of bandlike consolidation in the upper lobe which is stable from prior study. No new infiltrate or overt edema. Heart size and mediastinal contours are within normal limits. No effusion. Advanced bilateral glenohumeral DJD. IMPRESSION: 1.  No acute findings. 2. Left apical bullous change with stable bandlike consolidation. Electronically Signed   By: JONETTA Faes M.D.   On: 12/16/2023 14:56   CT ABDOMEN PELVIS WO CONTRAST Result Date: 12/16/2023 CLINICAL DATA:  Acute abdominal pain, history of gastric carcinoma EXAM: CT ABDOMEN AND PELVIS WITHOUT CONTRAST TECHNIQUE: Multidetector CT imaging of the abdomen and pelvis was performed following the standard protocol without IV contrast. RADIATION DOSE REDUCTION: This exam was performed according to the departmental dose-optimization program which includes automated exposure control, adjustment of the mA and/or kV according to patient size and/or use of iterative reconstruction technique. COMPARISON:  10/17/2023 FINDINGS: Lower chest: No pleural or pericardial effusion. Scattered coronary and aortic calcifications.  Small hiatal hernia. Hepatobiliary: No focal liver abnormality is seen. Status post cholecystectomy. No biliary dilatation. Pancreas: Unremarkable. No pancreatic ductal dilatation or surrounding inflammatory changes. Spleen: Normal in size without focal abnormality. Adrenals/Urinary Tract: No adrenal mass. No urolithiasis or hydronephrosis. Multiple cortical lesions in both kidneys, some of which can be characterized as cysts, largest 4.9 cm 13 HU, left upper pole; no followup recommended. Urinary bladder partially distended. Stomach/Bowel: Small hiatal hernia. Stomach is nondistended, with old gastrostomy tract. Small bowel is decompressed. Normal appendix. The colon is partially distended, with multiple scattered diverticula. Segmental wall thickening is suspected in the proximal sigmoid colon, without adjacent inflammatory change. Vascular/Lymphatic: Moderate aortoiliac calcified atheromatous plaque without aneurysm. No abdominal or pelvic adenopathy. Reproductive: Prostate enlargement. Other: Right pelvic phleboliths.  No ascites.  No free air. Musculoskeletal: Post cement vertebral augmentation L2. Stable spondylitic changes L3-S1. IMPRESSION: 1. No acute findings. 2. Colonic diverticulosis. 3. Small hiatal hernia. 4. Smooth wall thickening in the proximal sigmoid colon without adjacent inflammatory change, nonspecific. Consider outpatient elective colonoscopic evaluation. 5.  Aortic Atherosclerosis (ICD10-I70.0). Electronically Signed   By: JONETTA Faes M.D.   On: 12/16/2023 14:51    Microbiology: Recent Results (from the past 240 hours)  MRSA Next Gen by PCR, Nasal     Status: None   Collection Time: 12/17/23  5:21 AM   Specimen: Nasal Mucosa; Nasal Swab  Result Value Ref Range Status   MRSA by PCR Next Gen NOT DETECTED NOT DETECTED Final    Comment: (NOTE) The GeneXpert MRSA Assay (FDA approved for NASAL specimens only), is one component of [facility] comprehensive MRSA colonization surveillance program.  It is not intended to diagnose MRSA infection nor to guide or monitor treatment for MRSA infections. Test performance is not FDA approved in patients less than 62 years old. Performed at [facility], [address], [place], [place] [address]   C Difficile Quick Screen w PCR reflex     Status: Abnormal   Collection Time: 12/17/23  3:16 PM   Specimen: STOOL  Result Value Ref Range Status   C Diff antigen POSITIVE (Fahad Cisse) NEGATIVE Final   C Diff toxin NEGATIVE NEGATIVE Final   C Diff interpretation Results are indeterminate. See PCR results.  Final    Comment: Performed at [facility], [address], [place], [place] [address]  C. Diff by PCR, Reflexed     Status: None   Collection Time: 12/17/23  3:16 PM  Result Value Ref Range Status   Toxigenic C. Difficile by PCR NEGATIVE NEGATIVE Final    Comment: Patient is colonized with non toxigenic C. difficile. May not need treatment unless significant symptoms are present. Performed at [facility], [address], [place], [place] [address]   Gastrointestinal Panel by PCR , Stool  Status: None   Collection Time: 12/19/23 11:30 AM   Specimen: Stool  Result Value Ref Range Status   Campylobacter species NOT DETECTED NOT DETECTED Final   Plesimonas shigelloides NOT DETECTED NOT DETECTED Final   Salmonella species NOT DETECTED NOT DETECTED Final   Yersinia enterocolitica NOT DETECTED NOT DETECTED Final   Vibrio species NOT DETECTED NOT DETECTED Final   Vibrio cholerae NOT DETECTED NOT DETECTED Final   Enteroaggregative E coli (EAEC) NOT DETECTED NOT DETECTED Final   Enteropathogenic E coli (EPEC) NOT DETECTED NOT DETECTED Final   Enterotoxigenic E coli (ETEC) NOT DETECTED NOT DETECTED Final   Shiga like toxin producing E coli (STEC) NOT DETECTED NOT DETECTED Final   Shigella/Enteroinvasive E coli (EIEC) NOT DETECTED NOT DETECTED Final   Cryptosporidium NOT DETECTED NOT DETECTED Final   Cyclospora cayetanensis NOT DETECTED NOT DETECTED  Final   Entamoeba histolytica NOT DETECTED NOT DETECTED Final   Giardia lamblia NOT DETECTED NOT DETECTED Final   Adenovirus F40/41 NOT DETECTED NOT DETECTED Final   Astrovirus NOT DETECTED NOT DETECTED Final   Norovirus GI/GII NOT DETECTED NOT DETECTED Final   Rotavirus Mea Ozga NOT DETECTED NOT DETECTED Final   Sapovirus (I, II, IV, and V) NOT DETECTED NOT DETECTED Final    Comment: Performed at [facility], [address] [place], [place] [address]     Labs: Basic Metabolic Panel: Recent Labs  Lab 12/17/23 1253 12/17/23 2209 12/18/23 0828 12/18/23 1557 12/19/23 0354 12/20/23 0406 12/21/23 0350 12/22/23 0350  NA 149*  --  140  --  137 136 138 136  K 2.3*   < > 2.6* 3.0* 3.2* 2.8* 3.3* 3.3*  CL 119*  --  114*  --  112* 110 114* 112*  CO2 22  --  23  --  19* 18* 17* 19*  GLUCOSE 91  --  139*  --  69* 113* 193* 131*  BUN 20  --  16  --  15 16 17 18   CREATININE 2.10*  --  1.86*  --  1.64* 1.73* 1.72* 1.67*  CALCIUM  9.0  --  8.2*  --  8.3* 8.3* 7.8* 8.0*  MG 1.6*  --  1.7  --  2.1 1.9 1.7  --   PHOS 2.0*  --  1.3*  --  1.5* 2.9 2.5 2.1*   < > = values in this interval not displayed.   Liver Function Tests: Recent Labs  Lab 12/16/23 1355  AST 22  ALT 14  ALKPHOS 77  BILITOT 0.7  PROT 7.3  ALBUMIN  3.1*   Recent Labs  Lab 12/16/23 1355  LIPASE 33   No results for input(s): AMMONIA in the last 168 hours. CBC: Recent Labs  Lab 12/16/23 1355 12/17/23 2210 12/18/23 0828 12/20/23 0406 12/21/23 0350 12/22/23 0350  WBC 15.7* 14.3* 13.2* 11.3* 10.5 11.7*  NEUTROABS 14.6* 12.6* 11.4*  --   --   --   HGB 12.1* 10.4* 9.3* 10.4* 9.5* 10.0*  HCT 41.0 34.3* 30.5* 33.6* 29.4* 32.0*  MCV 74.0* 72.2* 71.9* 72.4* 70.2* 71.6*  PLT 256 215 180 165 150 181   Cardiac Enzymes: Recent Labs  Lab 12/17/23 1253  CKTOTAL 83   BNP: BNP (last 3 results) Recent Labs    12/16/23 1355  BNP 135.0*    ProBNP (last 3 results) No results for input(s): PROBNP in the  last 8760 hours.  CBG: Recent Labs  Lab 12/21/23 1139 12/21/23 1657 12/21/23 1659 12/22/23 0728 12/22/23 1107  GLUCAP  183* 137* 131* 120* 157*       Signed:  Meliton Monte MD.  [facility] 12/22/2023, 11:12 AM

## 2023-12-22 NOTE — TOC Transition Note (Signed)
 Transition of Care [facility]) - Discharge Note   Patient Details  Name: [name] MRN: [id number] Date of Birth: 23-Aug-1946  Transition of Care [facility]) CM/SW Contact:  Noreen KATHEE Cleotilde ISRAEL Phone Number: 12/22/2023, 11:23 AM   Clinical Narrative:    Patient is discharging today back to [facility]. Debbie with CV made aware and provided with DC summary. Hospice was  also made aware of pt DC today so they can start Hospice care at SNF today around 2pm. Patient will go to room B3-2 and call number for report is [phone]. MD and nurse aware. Called spouse, unable to reach her. Med necessity form complete and EMS has already been called they should be here in the next  1HR 1/2 -2 HR. Nurse updated.     Final next level of care: Skilled Nursing Facility [facility]) Barriers to Discharge: Barriers Resolved   Patient Goals and CMS Choice Patient states their goals for this hospitalization and ongoing recovery are:: return back to SNF with hospice CMS Medicare.gov Compare Post Acute Care list provided to:: Patient Represenative (must comment) (Spouse) Choice offered to / list presented to : Spouse, HC POA / Guardian [place] ownership interest in [facility].provided to:: [facility] POA / Guardian    Discharge Placement                Patient to be transferred to facility by: Ambulance Name of family member notified: Spouse Patient and family notified of of transfer: 12/22/23  Discharge Plan and Services Additional resources added to the After Visit Summary for   In-house Referral: Clinical Social Work Discharge Planning Services: CM Consult Post Acute Care Choice: Durable Medical Equipment, Nursing Home                   Social Drivers of Health (SDOH) Interventions SDOH Screenings   Food Insecurity: No Food Insecurity (12/16/2023)  Housing: Unknown (12/17/2023)  Transportation Needs: No Transportation Needs (12/16/2023)  Utilities: Not At Risk (12/16/2023)  Alcohol Screen: Low  Risk  (01/26/2021)  Depression (PHQ2-9): Low Risk  (09/21/2021)  Financial Resource Strain: Low Risk  (05/29/2020)  Social Connections: Patient Declined (12/17/2023)  Tobacco Use: Medium Risk (12/16/2023)     Readmission Risk Interventions    12/22/2023   11:19 AM 12/20/2023   12:46 PM 12/18/2023   10:38 AM  Readmission Risk Prevention Plan  Transportation Screening Complete Complete Complete  HRI or Home Care Consult   Complete  Social Work Consult for Recovery Care Planning/Counseling   Complete  Palliative Care Screening   Not Applicable  Medication Review Oceanographer) Complete Complete Complete  HRI or Home Care Consult Complete Complete   SW Recovery Care/Counseling Consult Complete Complete   Palliative Care Screening Complete Complete   Skilled Nursing Facility Complete Complete

## 2023-12-22 NOTE — Care Management Important Message (Signed)
 Important Message  Patient Details  Name: [name] MRN: [id number] Date of Birth: 12-15-1945   Important Message Given:  No (starting Hospice care)     Corey Harold 12/22/2023, 11:52 AM

## 2023-12-25 DIAGNOSIS — I10 Essential (primary) hypertension: Secondary | ICD-10-CM | POA: Diagnosis not present

## 2023-12-25 DIAGNOSIS — E782 Mixed hyperlipidemia: Secondary | ICD-10-CM | POA: Diagnosis not present

## 2023-12-25 DIAGNOSIS — R54 Age-related physical debility: Secondary | ICD-10-CM | POA: Diagnosis not present

## 2023-12-25 DIAGNOSIS — R1310 Dysphagia, unspecified: Secondary | ICD-10-CM | POA: Diagnosis not present

## 2023-12-25 DIAGNOSIS — E878 Other disorders of electrolyte and fluid balance, not elsewhere classified: Secondary | ICD-10-CM | POA: Diagnosis not present

## 2023-12-28 DIAGNOSIS — Z515 Encounter for palliative care: Secondary | ICD-10-CM | POA: Diagnosis not present

## 2023-12-28 DIAGNOSIS — G894 Chronic pain syndrome: Secondary | ICD-10-CM | POA: Diagnosis not present

## 2023-12-28 DIAGNOSIS — R5381 Other malaise: Secondary | ICD-10-CM | POA: Diagnosis not present

## 2023-12-28 DIAGNOSIS — I2510 Atherosclerotic heart disease of native coronary artery without angina pectoris: Secondary | ICD-10-CM | POA: Diagnosis not present

## 2023-12-28 DIAGNOSIS — E860 Dehydration: Secondary | ICD-10-CM | POA: Diagnosis not present

## 2023-12-28 DIAGNOSIS — E119 Type 2 diabetes mellitus without complications: Secondary | ICD-10-CM | POA: Diagnosis not present

## 2023-12-28 DIAGNOSIS — D472 Monoclonal gammopathy: Secondary | ICD-10-CM | POA: Diagnosis not present

## 2023-12-29 DIAGNOSIS — I10 Essential (primary) hypertension: Secondary | ICD-10-CM | POA: Diagnosis not present

## 2023-12-29 DIAGNOSIS — R54 Age-related physical debility: Secondary | ICD-10-CM | POA: Diagnosis not present

## 2023-12-29 DIAGNOSIS — E782 Mixed hyperlipidemia: Secondary | ICD-10-CM | POA: Diagnosis not present

## 2023-12-29 DIAGNOSIS — E878 Other disorders of electrolyte and fluid balance, not elsewhere classified: Secondary | ICD-10-CM | POA: Diagnosis not present

## 2023-12-29 DIAGNOSIS — R1310 Dysphagia, unspecified: Secondary | ICD-10-CM | POA: Diagnosis not present

## 2024-01-02 ENCOUNTER — Ambulatory Visit: Payer: Medicare Other | Admitting: Internal Medicine

## 2024-01-10 DIAGNOSIS — R059 Cough, unspecified: Secondary | ICD-10-CM | POA: Diagnosis not present

## 2024-01-10 DIAGNOSIS — R54 Age-related physical debility: Secondary | ICD-10-CM | POA: Diagnosis not present

## 2024-01-10 DIAGNOSIS — E782 Mixed hyperlipidemia: Secondary | ICD-10-CM | POA: Diagnosis not present

## 2024-01-10 DIAGNOSIS — I10 Essential (primary) hypertension: Secondary | ICD-10-CM | POA: Diagnosis not present

## 2024-01-10 DIAGNOSIS — E878 Other disorders of electrolyte and fluid balance, not elsewhere classified: Secondary | ICD-10-CM | POA: Diagnosis not present

## 2024-01-10 DIAGNOSIS — R1310 Dysphagia, unspecified: Secondary | ICD-10-CM | POA: Diagnosis not present

## 2024-01-15 DIAGNOSIS — I10 Essential (primary) hypertension: Secondary | ICD-10-CM | POA: Diagnosis not present

## 2024-01-15 DIAGNOSIS — E782 Mixed hyperlipidemia: Secondary | ICD-10-CM | POA: Diagnosis not present

## 2024-01-15 DIAGNOSIS — E878 Other disorders of electrolyte and fluid balance, not elsewhere classified: Secondary | ICD-10-CM | POA: Diagnosis not present

## 2024-01-15 DIAGNOSIS — R1310 Dysphagia, unspecified: Secondary | ICD-10-CM | POA: Diagnosis not present

## 2024-01-15 DIAGNOSIS — R059 Cough, unspecified: Secondary | ICD-10-CM | POA: Diagnosis not present

## 2024-01-15 DIAGNOSIS — R54 Age-related physical debility: Secondary | ICD-10-CM | POA: Diagnosis not present

## 2024-01-26 DIAGNOSIS — L03032 Cellulitis of left toe: Secondary | ICD-10-CM | POA: Diagnosis not present

## 2024-02-01 ENCOUNTER — Emergency Department (HOSPITAL_COMMUNITY): Payer: Medicare Other

## 2024-02-01 ENCOUNTER — Emergency Department (HOSPITAL_COMMUNITY)

## 2024-02-01 ENCOUNTER — Other Ambulatory Visit: Payer: Self-pay

## 2024-02-01 ENCOUNTER — Inpatient Hospital Stay (HOSPITAL_COMMUNITY)
Admission: EM | Admit: 2024-02-01 | Discharge: 2024-02-05 | DRG: 385 | Disposition: A | Discharge status: Hospice | Source: Skilled Nursing Facility | Attending: Family Medicine | Admitting: Family Medicine

## 2024-02-01 ENCOUNTER — Encounter (HOSPITAL_COMMUNITY): Payer: Self-pay | Admitting: *Deleted

## 2024-02-01 DIAGNOSIS — E785 Hyperlipidemia, unspecified: Secondary | ICD-10-CM | POA: Diagnosis present

## 2024-02-01 DIAGNOSIS — Z515 Encounter for palliative care: Secondary | ICD-10-CM | POA: Diagnosis not present

## 2024-02-01 DIAGNOSIS — Z923 Personal history of irradiation: Secondary | ICD-10-CM

## 2024-02-01 DIAGNOSIS — E876 Hypokalemia: Secondary | ICD-10-CM | POA: Diagnosis present

## 2024-02-01 DIAGNOSIS — Z8701 Personal history of pneumonia (recurrent): Secondary | ICD-10-CM

## 2024-02-01 DIAGNOSIS — K7581 Nonalcoholic steatohepatitis (NASH): Secondary | ICD-10-CM | POA: Diagnosis present

## 2024-02-01 DIAGNOSIS — Z85118 Personal history of other malignant neoplasm of bronchus and lung: Secondary | ICD-10-CM

## 2024-02-01 DIAGNOSIS — I69351 Hemiplegia and hemiparesis following cerebral infarction affecting right dominant side: Secondary | ICD-10-CM | POA: Diagnosis not present

## 2024-02-01 DIAGNOSIS — E1169 Type 2 diabetes mellitus with other specified complication: Secondary | ICD-10-CM | POA: Diagnosis not present

## 2024-02-01 DIAGNOSIS — J189 Pneumonia, unspecified organism: Secondary | ICD-10-CM | POA: Diagnosis not present

## 2024-02-01 DIAGNOSIS — K5130 Ulcerative (chronic) rectosigmoiditis without complications: Secondary | ICD-10-CM | POA: Diagnosis present

## 2024-02-01 DIAGNOSIS — I10 Essential (primary) hypertension: Secondary | ICD-10-CM | POA: Diagnosis not present

## 2024-02-01 DIAGNOSIS — I82621 Acute embolism and thrombosis of deep veins of right upper extremity: Secondary | ICD-10-CM | POA: Diagnosis not present

## 2024-02-01 DIAGNOSIS — E039 Hypothyroidism, unspecified: Secondary | ICD-10-CM | POA: Diagnosis present

## 2024-02-01 DIAGNOSIS — E878 Other disorders of electrolyte and fluid balance, not elsewhere classified: Secondary | ICD-10-CM | POA: Diagnosis not present

## 2024-02-01 DIAGNOSIS — Z743 Need for continuous supervision: Secondary | ICD-10-CM | POA: Diagnosis not present

## 2024-02-01 DIAGNOSIS — R0602 Shortness of breath: Secondary | ICD-10-CM | POA: Diagnosis not present

## 2024-02-01 DIAGNOSIS — N183 Chronic kidney disease, stage 3 unspecified: Secondary | ICD-10-CM | POA: Diagnosis present

## 2024-02-01 DIAGNOSIS — Z66 Do not resuscitate: Secondary | ICD-10-CM | POA: Diagnosis present

## 2024-02-01 DIAGNOSIS — Z1152 Encounter for screening for COVID-19: Secondary | ICD-10-CM | POA: Diagnosis not present

## 2024-02-01 DIAGNOSIS — Z8572 Personal history of non-Hodgkin lymphomas: Secondary | ICD-10-CM

## 2024-02-01 DIAGNOSIS — M109 Gout, unspecified: Secondary | ICD-10-CM | POA: Diagnosis present

## 2024-02-01 DIAGNOSIS — I69391 Dysphagia following cerebral infarction: Secondary | ICD-10-CM

## 2024-02-01 DIAGNOSIS — T502X5A Adverse effect of carbonic-anhydrase inhibitors, benzothiadiazides and other diuretics, initial encounter: Secondary | ICD-10-CM | POA: Diagnosis present

## 2024-02-01 DIAGNOSIS — K219 Gastro-esophageal reflux disease without esophagitis: Secondary | ICD-10-CM | POA: Diagnosis present

## 2024-02-01 DIAGNOSIS — R1084 Generalized abdominal pain: Secondary | ICD-10-CM | POA: Diagnosis not present

## 2024-02-01 DIAGNOSIS — N400 Enlarged prostate without lower urinary tract symptoms: Secondary | ICD-10-CM | POA: Diagnosis present

## 2024-02-01 DIAGNOSIS — D472 Monoclonal gammopathy: Secondary | ICD-10-CM | POA: Diagnosis present

## 2024-02-01 DIAGNOSIS — R6889 Other general symptoms and signs: Secondary | ICD-10-CM | POA: Diagnosis not present

## 2024-02-01 DIAGNOSIS — Z87891 Personal history of nicotine dependence: Secondary | ICD-10-CM

## 2024-02-01 DIAGNOSIS — J690 Pneumonitis due to inhalation of food and vomit: Secondary | ICD-10-CM | POA: Diagnosis present

## 2024-02-01 DIAGNOSIS — N1832 Chronic kidney disease, stage 3b: Secondary | ICD-10-CM | POA: Diagnosis present

## 2024-02-01 DIAGNOSIS — F32A Depression, unspecified: Secondary | ICD-10-CM | POA: Diagnosis present

## 2024-02-01 DIAGNOSIS — Z794 Long term (current) use of insulin: Secondary | ICD-10-CM | POA: Diagnosis not present

## 2024-02-01 DIAGNOSIS — R109 Unspecified abdominal pain: Secondary | ICD-10-CM | POA: Diagnosis not present

## 2024-02-01 DIAGNOSIS — Z7982 Long term (current) use of aspirin: Secondary | ICD-10-CM

## 2024-02-01 DIAGNOSIS — J449 Chronic obstructive pulmonary disease, unspecified: Secondary | ICD-10-CM | POA: Diagnosis present

## 2024-02-01 DIAGNOSIS — Z7401 Bed confinement status: Secondary | ICD-10-CM | POA: Diagnosis not present

## 2024-02-01 DIAGNOSIS — M7989 Other specified soft tissue disorders: Secondary | ICD-10-CM | POA: Diagnosis not present

## 2024-02-01 DIAGNOSIS — B974 Respiratory syncytial virus as the cause of diseases classified elsewhere: Secondary | ICD-10-CM | POA: Diagnosis present

## 2024-02-01 DIAGNOSIS — I129 Hypertensive chronic kidney disease with stage 1 through stage 4 chronic kidney disease, or unspecified chronic kidney disease: Secondary | ICD-10-CM | POA: Diagnosis present

## 2024-02-01 DIAGNOSIS — E1140 Type 2 diabetes mellitus with diabetic neuropathy, unspecified: Secondary | ICD-10-CM | POA: Diagnosis present

## 2024-02-01 DIAGNOSIS — E119 Type 2 diabetes mellitus without complications: Secondary | ICD-10-CM

## 2024-02-01 DIAGNOSIS — Z79899 Other long term (current) drug therapy: Secondary | ICD-10-CM

## 2024-02-01 DIAGNOSIS — J181 Lobar pneumonia, unspecified organism: Secondary | ICD-10-CM | POA: Diagnosis present

## 2024-02-01 DIAGNOSIS — Z8249 Family history of ischemic heart disease and other diseases of the circulatory system: Secondary | ICD-10-CM

## 2024-02-01 DIAGNOSIS — I69959 Hemiplegia and hemiparesis following unspecified cerebrovascular disease affecting unspecified side: Secondary | ICD-10-CM | POA: Diagnosis not present

## 2024-02-01 DIAGNOSIS — K529 Noninfective gastroenteritis and colitis, unspecified: Secondary | ICD-10-CM | POA: Diagnosis not present

## 2024-02-01 DIAGNOSIS — H409 Unspecified glaucoma: Secondary | ICD-10-CM | POA: Diagnosis present

## 2024-02-01 DIAGNOSIS — K5730 Diverticulosis of large intestine without perforation or abscess without bleeding: Secondary | ICD-10-CM | POA: Diagnosis not present

## 2024-02-01 DIAGNOSIS — H50012 Monocular esotropia, left eye: Secondary | ICD-10-CM | POA: Diagnosis present

## 2024-02-01 DIAGNOSIS — E1122 Type 2 diabetes mellitus with diabetic chronic kidney disease: Secondary | ICD-10-CM | POA: Diagnosis present

## 2024-02-01 DIAGNOSIS — R079 Chest pain, unspecified: Secondary | ICD-10-CM | POA: Diagnosis not present

## 2024-02-01 DIAGNOSIS — N281 Cyst of kidney, acquired: Secondary | ICD-10-CM | POA: Diagnosis not present

## 2024-02-01 DIAGNOSIS — R918 Other nonspecific abnormal finding of lung field: Secondary | ICD-10-CM | POA: Diagnosis not present

## 2024-02-01 DIAGNOSIS — D7389 Other diseases of spleen: Secondary | ICD-10-CM | POA: Diagnosis not present

## 2024-02-01 HISTORY — DX: Chronic kidney disease, stage 3a: N18.31

## 2024-02-01 HISTORY — DX: Depression, unspecified: F32.A

## 2024-02-01 HISTORY — DX: Dysphagia, unspecified: R13.10

## 2024-02-01 HISTORY — DX: Atherosclerosis of aorta: I70.0

## 2024-02-01 HISTORY — DX: Insomnia, unspecified: G47.00

## 2024-02-01 LAB — CBC WITH DIFFERENTIAL/PLATELET
Abs Immature Granulocytes: 0.06 10*3/uL (ref 0.00–0.07)
Basophils Absolute: 0 10*3/uL (ref 0.0–0.1)
Basophils Relative: 0 %
Eosinophils Absolute: 0.1 10*3/uL (ref 0.0–0.5)
Eosinophils Relative: 1 %
HCT: 30 % — ABNORMAL LOW (ref 39.0–52.0)
Hemoglobin: 9.4 g/dL — ABNORMAL LOW (ref 13.0–17.0)
Immature Granulocytes: 1 %
Lymphocytes Relative: 8 %
Lymphs Abs: 0.8 10*3/uL (ref 0.7–4.0)
MCH: 21.5 pg — ABNORMAL LOW (ref 26.0–34.0)
MCHC: 31.3 g/dL (ref 30.0–36.0)
MCV: 68.6 fL — ABNORMAL LOW (ref 80.0–100.0)
Monocytes Absolute: 0.7 10*3/uL (ref 0.1–1.0)
Monocytes Relative: 7 %
Neutro Abs: 8.2 10*3/uL — ABNORMAL HIGH (ref 1.7–7.7)
Neutrophils Relative %: 83 %
Platelets: 307 10*3/uL (ref 150–400)
RBC: 4.37 MIL/uL (ref 4.22–5.81)
RDW: 22.9 % — ABNORMAL HIGH (ref 11.5–15.5)
WBC: 9.9 10*3/uL (ref 4.0–10.5)
nRBC: 0 % (ref 0.0–0.2)

## 2024-02-01 LAB — COMPREHENSIVE METABOLIC PANEL
ALT: 9 U/L (ref 0–44)
AST: 10 U/L — ABNORMAL LOW (ref 15–41)
Albumin: 2 g/dL — ABNORMAL LOW (ref 3.5–5.0)
Alkaline Phosphatase: 82 U/L (ref 38–126)
Anion gap: 9 (ref 5–15)
BUN: 19 mg/dL (ref 8–23)
CO2: 28 mmol/L (ref 22–32)
Calcium: 7.6 mg/dL — ABNORMAL LOW (ref 8.9–10.3)
Chloride: 97 mmol/L — ABNORMAL LOW (ref 98–111)
Creatinine, Ser: 1.59 mg/dL — ABNORMAL HIGH (ref 0.61–1.24)
GFR, Estimated: 44 mL/min — ABNORMAL LOW (ref >60)
Glucose, Bld: 209 mg/dL — ABNORMAL HIGH (ref 70–99)
Potassium: 2.6 mmol/L — CL (ref 3.5–5.1)
Sodium: 134 mmol/L — ABNORMAL LOW (ref 135–145)
Total Bilirubin: 0.4 mg/dL (ref 0.0–1.2)
Total Protein: 5.1 g/dL — ABNORMAL LOW (ref 6.5–8.1)

## 2024-02-01 LAB — URINALYSIS, ROUTINE W REFLEX MICROSCOPIC
Bilirubin Urine: NEGATIVE
Glucose, UA: 50 mg/dL — AB
Ketones, ur: NEGATIVE mg/dL
Leukocytes,Ua: NEGATIVE
Nitrite: NEGATIVE
Protein, ur: 100 mg/dL — AB
Specific Gravity, Urine: 1.015 (ref 1.005–1.030)
pH: 6 (ref 5.0–8.0)

## 2024-02-01 LAB — RESP PANEL BY RT-PCR (RSV, FLU A&B, COVID)  RVPGX2
Influenza A by PCR: NEGATIVE
Influenza B by PCR: NEGATIVE
Resp Syncytial Virus by PCR: POSITIVE — AB
SARS Coronavirus 2 by RT PCR: NEGATIVE

## 2024-02-01 LAB — CBG MONITORING, ED: Glucose-Capillary: 181 mg/dL — ABNORMAL HIGH (ref 70–99)

## 2024-02-01 LAB — MAGNESIUM: Magnesium: 1 mg/dL — ABNORMAL LOW (ref 1.7–2.4)

## 2024-02-01 MED ORDER — POLYETHYLENE GLYCOL 3350 17 G PO PACK: 17.0000 g | PACK | Freq: Every day | ORAL | Status: DC | PRN

## 2024-02-01 MED ORDER — POTASSIUM CHLORIDE 10 MEQ/100ML IV SOLN
10.0000 meq | Freq: Once | INTRAVENOUS | Status: AC
  Administered 2024-02-01: 10 meq via INTRAVENOUS
  Filled 2024-02-01: qty 100

## 2024-02-01 MED ORDER — HYDROMORPHONE HCL 1 MG/ML IJ SOLN
0.5000 mg | Freq: Once | INTRAMUSCULAR | Status: AC
  Administered 2024-02-01: 0.5 mg via INTRAVENOUS
  Filled 2024-02-01: qty 0.5

## 2024-02-01 MED ORDER — IPRATROPIUM-ALBUTEROL 0.5-2.5 (3) MG/3ML IN SOLN
3.0000 mL | Freq: Four times a day (QID) | RESPIRATORY_TRACT | Status: DC
  Administered 2024-02-02 – 2024-02-03 (×7): 3 mL via RESPIRATORY_TRACT
  Filled 2024-02-01 (×7): qty 3

## 2024-02-01 MED ORDER — FUROSEMIDE 40 MG PO TABS
40.0000 mg | ORAL_TABLET | Freq: Every day | ORAL | Status: DC
  Administered 2024-02-02 – 2024-02-03 (×2): 40 mg via ORAL
  Filled 2024-02-01 (×2): qty 1

## 2024-02-01 MED ORDER — METRONIDAZOLE 500 MG/100ML IV SOLN
500.0000 mg | Freq: Once | INTRAVENOUS | Status: AC
  Administered 2024-02-01: 500 mg via INTRAVENOUS
  Filled 2024-02-01: qty 100

## 2024-02-01 MED ORDER — SODIUM CHLORIDE 0.9 % IV BOLUS
1000.0000 mL | Freq: Once | INTRAVENOUS | Status: AC
  Administered 2024-02-01: 1000 mL via INTRAVENOUS

## 2024-02-01 MED ORDER — CEFTRIAXONE SODIUM 2 G IJ SOLR
2.0000 g | Freq: Once | INTRAMUSCULAR | Status: AC
  Administered 2024-02-01: 2 g via INTRAVENOUS
  Filled 2024-02-01: qty 20

## 2024-02-01 MED ORDER — POTASSIUM CHLORIDE 10 MEQ/100ML IV SOLN
10.0000 meq | Freq: Once | INTRAVENOUS | Status: DC
  Filled 2024-02-01: qty 100

## 2024-02-01 MED ORDER — ALLOPURINOL 300 MG PO TABS
150.0000 mg | ORAL_TABLET | Freq: Every day | ORAL | Status: DC
Start: 2024-02-02 — End: 2024-02-05
  Administered 2024-02-02 – 2024-02-05 (×4): 150 mg via ORAL
  Filled 2024-02-01 (×4): qty 1

## 2024-02-01 MED ORDER — ENOXAPARIN SODIUM 40 MG/0.4ML IJ SOSY
40.0000 mg | PREFILLED_SYRINGE | INTRAMUSCULAR | Status: DC
  Administered 2024-02-02: 40 mg via SUBCUTANEOUS
  Filled 2024-02-01: qty 0.4

## 2024-02-01 MED ORDER — HYDROMORPHONE HCL 1 MG/ML IJ SOLN
0.5000 mg | INTRAMUSCULAR | Status: DC | PRN
  Filled 2024-02-01: qty 1

## 2024-02-01 MED ORDER — IOHEXOL 300 MG/ML  SOLN
80.0000 mL | Freq: Once | INTRAMUSCULAR | Status: AC | PRN
  Administered 2024-02-01: 80 mL via INTRAVENOUS

## 2024-02-01 MED ORDER — SODIUM CHLORIDE 0.9 % IV BOLUS
500.0000 mL | Freq: Once | INTRAVENOUS | Status: AC
  Administered 2024-02-01: 500 mL via INTRAVENOUS

## 2024-02-01 MED ORDER — ONDANSETRON HCL 4 MG/2ML IJ SOLN
4.0000 mg | Freq: Once | INTRAMUSCULAR | Status: AC
  Administered 2024-02-01: 4 mg via INTRAVENOUS
  Filled 2024-02-01: qty 2

## 2024-02-01 MED ORDER — ACETAMINOPHEN 650 MG RE SUPP: 650.0000 mg | Freq: Four times a day (QID) | RECTAL | Status: DC | PRN

## 2024-02-01 MED ORDER — MAGNESIUM SULFATE 2 GM/50ML IV SOLN
2.0000 g | Freq: Once | INTRAVENOUS | Status: AC
  Administered 2024-02-02: 2 g via INTRAVENOUS
  Filled 2024-02-01: qty 50

## 2024-02-01 MED ORDER — IPRATROPIUM-ALBUTEROL 0.5-2.5 (3) MG/3ML IN SOLN: 3.0000 mL | Freq: Four times a day (QID) | RESPIRATORY_TRACT | Status: DC

## 2024-02-01 MED ORDER — ONDANSETRON HCL 4 MG/2ML IJ SOLN
4.0000 mg | Freq: Four times a day (QID) | INTRAMUSCULAR | Status: DC | PRN
  Administered 2024-02-03 – 2024-02-04 (×3): 4 mg via INTRAVENOUS
  Filled 2024-02-01 (×3): qty 2

## 2024-02-01 MED ORDER — AMLODIPINE BESYLATE 5 MG PO TABS
10.0000 mg | ORAL_TABLET | Freq: Every day | ORAL | Status: DC
  Administered 2024-02-02 – 2024-02-05 (×4): 10 mg via ORAL
  Filled 2024-02-01 (×4): qty 2

## 2024-02-01 MED ORDER — METOPROLOL TARTRATE 50 MG PO TABS
50.0000 mg | ORAL_TABLET | Freq: Every day | ORAL | Status: DC
  Administered 2024-02-02 – 2024-02-03 (×2): 50 mg via ORAL
  Filled 2024-02-01 (×2): qty 1

## 2024-02-01 MED ORDER — INSULIN ASPART 100 UNIT/ML IJ SOLN
0.0000 [IU] | Freq: Three times a day (TID) | INTRAMUSCULAR | Status: DC
  Administered 2024-02-02: 1 [IU] via SUBCUTANEOUS
  Administered 2024-02-02 – 2024-02-03 (×2): 2 [IU] via SUBCUTANEOUS

## 2024-02-01 MED ORDER — POTASSIUM CHLORIDE 10 MEQ/100ML IV SOLN
10.0000 meq | Freq: Once | INTRAVENOUS | Status: AC
  Administered 2024-02-01: 10 meq via INTRAVENOUS

## 2024-02-01 MED ORDER — INSULIN ASPART 100 UNIT/ML IJ SOLN: 0.0000 [IU] | Freq: Every day | INTRAMUSCULAR | Status: DC

## 2024-02-01 MED ORDER — ONDANSETRON HCL 4 MG PO TABS: 4.0000 mg | ORAL_TABLET | Freq: Four times a day (QID) | ORAL | Status: DC | PRN

## 2024-02-01 MED ORDER — POTASSIUM CHLORIDE 10 MEQ/100ML IV SOLN
10.0000 meq | INTRAVENOUS | Status: AC
  Administered 2024-02-02 (×3): 10 meq via INTRAVENOUS
  Filled 2024-02-01 (×3): qty 100

## 2024-02-01 MED ORDER — BACLOFEN 10 MG PO TABS: 5.0000 mg | ORAL_TABLET | Freq: Three times a day (TID) | ORAL | Status: DC | PRN

## 2024-02-01 MED ORDER — TAMSULOSIN HCL 0.4 MG PO CAPS
0.4000 mg | ORAL_CAPSULE | Freq: Every day | ORAL | Status: DC
  Administered 2024-02-02 – 2024-02-05 (×4): 0.4 mg via ORAL
  Filled 2024-02-01 (×4): qty 1

## 2024-02-01 MED ORDER — POTASSIUM CHLORIDE 20 MEQ PO PACK
40.0000 meq | PACK | Freq: Once | ORAL | Status: AC
  Administered 2024-02-02: 40 meq via ORAL
  Filled 2024-02-01: qty 2

## 2024-02-01 MED ORDER — ACETAMINOPHEN 325 MG PO TABS: 650.0000 mg | ORAL_TABLET | Freq: Four times a day (QID) | ORAL | Status: DC | PRN

## 2024-02-01 MED ORDER — PANTOPRAZOLE SODIUM 40 MG PO TBEC
40.0000 mg | DELAYED_RELEASE_TABLET | Freq: Two times a day (BID) | ORAL | Status: DC
  Administered 2024-02-02 – 2024-02-05 (×8): 40 mg via ORAL
  Filled 2024-02-01 (×8): qty 1

## 2024-02-01 NOTE — Assessment & Plan Note (Signed)
 Resume allopurinol

## 2024-02-01 NOTE — Assessment & Plan Note (Addendum)
Stable.  Presenting with pneumonia and RSV infection. -Resume home regimen

## 2024-02-01 NOTE — ED Provider Notes (Signed)
EMERGENCY DEPARTMENT AT [facility] Provider Note   CSN: [id number] Arrival date & time: 02/01/24  1546     History {Add pertinent medical, surgical, social history, OB history to HPI:1} No chief complaint on file.   [name] is a 78 y.o. male.  Patient has a history of hypertension CVA and kidney disease.  Patient complains of abdominal pain and vomiting   Abdominal Pain      Home Medications Prior to Admission medications   Medication Sig Start Date End Date Taking? Authorizing Provider  acetaminophen (TYLENOL) 650 MG CR tablet Take 650 mg by mouth every 4 (four) hours as needed for pain.    [provider]  allopurinol (ZYLOPRIM) 300 MG tablet Take 0.5 tablets (150 mg total) by mouth daily. 12/23/23   Zigmund Daniel., MD  amLODipine (NORVASC) 10 MG tablet Take 1 tablet (10 mg total) by mouth daily. 12/10/21   Donita Brooks, MD  ASPIRIN ADULT LOW STRENGTH 81 MG tablet Take 1 tablet (81 mg total) by mouth daily. Restart in 1 week 10/23/23   Kendell Bane, MD  b complex-vitamin c-folic acid (NEPHRO-VITE) 0.8 MG TABS tablet Take 1 tablet by mouth at bedtime.    [provider]  Baclofen 5 MG TABS Take 1 tablet (5 mg total) by mouth 3 (three) times daily as needed. 12/22/23   Zigmund Daniel., MD  bisacodyl (DULCOLAX) 10 MG suppository Place 10 mg rectally daily as needed for moderate constipation.    [provider]  buPROPion ER (WELLBUTRIN SR) 100 MG 12 hr tablet Take 100 mg by mouth 2 (two) times daily. 02/03/22   [provider]  cinacalcet (SENSIPAR) 30 MG tablet Take 30 mg by mouth daily with breakfast. 02/03/22   [provider]  cyanocobalamin (VITAMIN B12) 1000 MCG tablet Take 1 tablet (1,000 mcg total) by mouth daily. 07/17/22   Erick Blinks, MD  furosemide (LASIX) 20 MG tablet Take 1 tablet (20 mg total) by mouth daily. 10/23/23   Shahmehdi, Gemma Payor, MD  gabapentin (NEURONTIN) 300  MG capsule Take 1 capsule (300 mg total) by mouth 2 (two) times daily. 12/22/23   Zigmund Daniel., MD  insulin aspart (NOVOLOG) 100 UNIT/ML injection Inject 0-6 Units into the skin 3 (three) times daily with meals. CBG 70 - 120: 0 units  CBG 121 - 150: 0 units  CBG 151 - 200: 1 unit  CBG 201-250: 2 units  CBG 251-300: 3 units  CBG 301-350: 4 units  CBG 351-400: 5 units  Give 6 units and Call MD if > 400 12/22/23   Zigmund Daniel., MD  ipratropium-albuterol (DUONEB) 0.5-2.5 (3) MG/3ML SOLN Take 3 mLs by nebulization every 6 (six) hours.    [provider]  iron polysaccharides (NIFEREX) 150 MG capsule TAKE 1 CAPSULE(150 MG) BY MOUTH DAILY Patient taking differently: Take 150 mg by mouth daily. 07/07/21   Donita Brooks, MD  metoprolol tartrate (LOPRESSOR) 50 MG tablet Take 50 mg by mouth daily. 12/10/21   [provider]  ondansetron (ZOFRAN-ODT) 4 MG disintegrating tablet Take 4 mg by mouth every 6 (six) hours as needed for nausea or vomiting.    [provider]  oxyCODONE-acetaminophen (PERCOCET) 7.5-325 MG tablet Take 1 tablet by mouth in the morning and at bedtime.    [provider]  pantoprazole (PROTONIX) 40 MG tablet Take 1 tablet (40 mg total) by mouth 2 (two) times daily.  10/20/23   Shahmehdi, Gemma Payor, MD  polyethylene glycol powder (MIRALAX) 17 GM/SCOOP powder Take one capful daily. Hold for diarrhea. Patient taking differently: Take 17 g by mouth daily. Hold for diarrhea. 08/24/22   Tiffany Kocher, PA-C  potassium chloride (KLOR-CON) 20 MEQ packet Take 40 mEq by mouth daily. 12/22/23   Zigmund Daniel., MD  Prucalopride Succinate (MOTEGRITY) 2 MG TABS Take 1 tablet (2 mg total) by mouth daily. 11/22/22   Rourk, Gerrit Friends, MD  rosuvastatin (CRESTOR) 20 MG tablet Take 20 mg by mouth daily.    [provider]  sucralfate (CARAFATE) 1 GM/10ML suspension Take 10 mLs (1 g total) by mouth 4 (four) times daily -  with meals and at  bedtime for 14 days. 12/22/23 01/05/24  Zigmund Daniel., MD  tamsulosin (FLOMAX) 0.4 MG CAPS capsule TAKE 1 CAPSULE(0.4 MG) BY MOUTH DAILY Patient taking differently: Take 0.4 mg by mouth daily. 12/10/21   Donita Brooks, MD  traZODone (DESYREL) 100 MG tablet Take 100 mg by mouth at bedtime. 03/07/22   [provider]      Allergies    Patient has no known allergies.    Review of Systems   Review of Systems  Gastrointestinal:  Positive for abdominal pain.    Physical Exam Updated Vital Signs BP (!) 155/91   Pulse 78   Temp 98.3 F (36.8 C)   Resp 10   Ht 6' (1.829 m)   Wt 101.4 kg   SpO2 98%   BMI 30.32 kg/m  Physical Exam  ED Results / Procedures / Treatments   Labs (all labs ordered are listed, but only abnormal results are displayed) Labs Reviewed  CBC WITH DIFFERENTIAL/PLATELET - Abnormal; Notable for the following components:      Result Value   Hemoglobin 9.4 (*)    HCT 30.0 (*)    MCV 68.6 (*)    MCH 21.5 (*)    RDW 22.9 (*)    Neutro Abs 8.2 (*)    All other components within normal limits  COMPREHENSIVE METABOLIC PANEL - Abnormal; Notable for the following components:   Sodium 134 (*)    Potassium 2.6 (*)    Chloride 97 (*)    Glucose, Bld 209 (*)    Creatinine, Ser 1.59 (*)    Calcium 7.6 (*)    Total Protein 5.1 (*)    Albumin 2.0 (*)    AST 10 (*)    GFR, Estimated 44 (*)    All other components within normal limits  RESP PANEL BY RT-PCR (RSV, FLU A&B, COVID)  RVPGX2  URINALYSIS, ROUTINE W REFLEX MICROSCOPIC  MAGNESIUM    EKG None  Radiology DG Chest Port 1 View Result Date: 02/01/2024 CLINICAL DATA:  Shortness of breath with chest and abdominal pain. EXAM: PORTABLE CHEST 1 VIEW COMPARISON:  12/16/2023. FINDINGS: The heart size and mediastinal contours are stable stable bandlike opacity is noted in the left upper lobe. There are surgical changes in the mid right lung. No acute infiltrate, effusion or pneumothorax is seen. No  acute osseous abnormality. IMPRESSION: 1. No acute abnormality. 2. Stable bandlike opacity in the left upper lobe. Electronically Signed   By: Thornell Sartorius M.D.   On: 02/01/2024 19:30   CT ABDOMEN PELVIS W CONTRAST Result Date: 02/01/2024 CLINICAL DATA:  Abdominal pain. EXAM: CT ABDOMEN AND PELVIS WITH CONTRAST TECHNIQUE: Multidetector CT imaging of the abdomen and pelvis was performed using the standard protocol following bolus  administration of intravenous contrast. RADIATION DOSE REDUCTION: This exam was performed according to the departmental dose-optimization program which includes automated exposure control, adjustment of the mA and/or kV according to patient size and/or use of iterative reconstruction technique. CONTRAST:  80mL OMNIPAQUE IOHEXOL 300 MG/ML  SOLN COMPARISON:  CT abdomen pelvis dated December 16, 2023. FINDINGS: Lower chest: New small bilateral pleural effusions. New peribronchial thickening and patchy consolidation in the posterior right lower lobe. Hepatobiliary: No focal liver abnormality is seen. Status post cholecystectomy. No biliary dilatation. Pancreas: Unremarkable. No pancreatic ductal dilatation or surrounding inflammatory changes. Spleen: 1.9 cm round hypodense lesion in the medial spleen, previously 1.8 cm in August 2023, indeterminate but benign given stability. Normal in size. Adrenals/Urinary Tract: Bilateral adrenal gland thickening again noted. Multiple bilateral renal simple cysts again noted. No follow-up imaging is recommended. No renal calculi or hydronephrosis. The bladder is unremarkable. Stomach/Bowel: Distended stomach. Unremarkable small bowel. Wall thickening of the rectosigmoid colon. Diffuse colonic diverticulosis. Normal appendix. Vascular/Lymphatic: Aortic atherosclerosis. No enlarged abdominal or pelvic lymph nodes. Reproductive: Unchanged mild prostatomegaly. Other: No free fluid or pneumoperitoneum. Musculoskeletal: No acute or significant osseous findings.  IMPRESSION: 1. Wall thickening of the rectosigmoid colon, suggestive of proctocolitis. 2. New right lower lobe pneumonia with small bilateral pleural effusions. 3.  Aortic Atherosclerosis (ICD10-I70.0). Electronically Signed   By: Obie Dredge M.D.   On: 02/01/2024 18:26    Procedures Procedures  {Document cardiac monitor, telemetry assessment procedure when appropriate:1}  Medications Ordered in ED Medications  cefTRIAXone (ROCEPHIN) 2 g in sodium chloride 0.9 % 100 mL IVPB (2 g Intravenous New Bag/Given 02/01/24 2050)  metroNIDAZOLE (FLAGYL) IVPB 500 mg (has no administration in time range)  sodium chloride 0.9 % bolus 500 mL (0 mLs Intravenous Stopped 02/01/24 1824)  HYDROmorphone (DILAUDID) injection 0.5 mg (0.5 mg Intravenous Given 02/01/24 1652)  ondansetron (ZOFRAN) injection 4 mg (4 mg Intravenous Given 02/01/24 1651)  sodium chloride 0.9 % bolus 1,000 mL (0 mLs Intravenous Stopped 02/01/24 2043)  potassium chloride 10 mEq in 100 mL IVPB (0 mEq Intravenous Stopped 02/01/24 1926)  iohexol (OMNIPAQUE) 300 MG/ML solution 80 mL (80 mLs Intravenous Contrast Given 02/01/24 1757)  potassium chloride 10 mEq in 100 mL IVPB (0 mEq Intravenous Stopped 02/01/24 2043)    ED Course/ Medical Decision Making/ A&P   {   Click here for ABCD2, HEART and other calculatorsREFRESH Note before signing :1}                              Medical Decision Making Amount and/or Complexity of Data Reviewed Labs: ordered. Radiology: ordered.  Risk Prescription drug management. Decision regarding hospitalization.   Patient with proctocolitis and hypokalemia.  He will be admitted to medicine  {Document critical care time when appropriate:1} {Document review of labs and clinical decision tools ie heart score, Chads2Vasc2 etc:1}  {Document your independent review of radiology images, and any outside records:1} {Document your discussion with family members, caretakers, and with consultants:1} {Document social  determinants of health affecting pt's care:1} {Document your decision making why or why not admission, treatments were needed:1} Final Clinical Impression(s) / ED Diagnoses Final diagnoses:  Proctocolitis  Hypokalemia    Rx / DC Orders ED Discharge Orders     None

## 2024-02-01 NOTE — Assessment & Plan Note (Addendum)
Hypokalemia of 2.6, hypomagnesemia of 1.  Likely secondary to diuretics. -Replete electrolytes

## 2024-02-01 NOTE — Assessment & Plan Note (Addendum)
Likely aspiration pneumonia.  Presenting with multiple episodes of vomiting, with baseline dysphagia.  CT showing new right lower lobe pneumonia.  RSV positive.  Rules out for sepsis.  Afebrile.  WBC 9.9. -IV ceftriaxone and metronidazole started for proctocolitis -Add azithromycin for CAP coverage -Mucolytics - Check Qtc on EKG

## 2024-02-01 NOTE — ED Triage Notes (Addendum)
Pt brought in by [facility] from [facility] with c/o chest/abdominal pain. Pt is currently on Hospice. Pt reports the pain is more epigastric and down into his abdomen per EMS. CBG 247, BP 170/90, HR 82 for EMS. Pt reported to EMS that the pain is the same as the chronic pain he has been experiencing.

## 2024-02-01 NOTE — Assessment & Plan Note (Signed)
CKD stage IIIb.  Stable.  Creatinine 1.59.

## 2024-02-01 NOTE — H&P (Addendum)
History and Physical    [name] WNU:[id number] DOB: 04-08-1946 DOA: 02/01/2024  PCP: System, Provider Not In   Patient coming from: [facility]  I have personally briefly reviewed patient's old medical records in [facility]  Chief Complaint: Vomiting, abdominal pain  HPI: [name] is a 78 y.o. male who is a hospice patient with medical history significant for CVA with residual dense right hemiplegia, COPD, gout, hypertension, MGUS, diabetes mellitus, hypothyroidism, CKD 3B. Patient was brought to the ED from [facility] reports of vomiting, diarrhea, abdominal pain that started 3 days ago.  At baseline, patient's speech is limited due to stroke, history is obtained per chart review, and spouse at bedside.  Patient reports multiple episodes of vomiting since onset. Reports productive cough, no difficulty breathing.  Reports chronic problems with esophagus and swallowing causing pneumonias. He also reports swelling to his right upper extremity, he is unsure of how long, but noted it over the past month.  He also has bilateral lower extremity swelling-also on the right.  ED Course: Tmax 98.3.  Heart rate 78-85.  Respiratory rate 10-18.  Blood pressure systolic 155-179.  O2 sats greater than 98% on room air. Potassium 2.6. Chest x-ray not acute abnormality, shows stable bandlike opacity-LUL. Ct abd/pelvis with contrast-wall thickening of the rectosigmoid colon suggestive of proctocolitis, new right lower lobe pneumonia with small bilateral pleural effusions. IV ceftriaxone and metronidazole started.  1.5 L bolus given.  Review of Systems: As per HPI all other systems reviewed and negative.  Past Medical History:  Diagnosis Date   Arthritis    Asthma    "hx of"   Atherosclerosis of aorta (HCC)    Barrett's esophagus    EGD 03/23/2011 & EGD 2/09 bx proven   BPH (benign prostatic hyperplasia)    Cancer of parotid gland (HCC) 11/23/2012   Adenocarcinoma   Chronic  abdominal pain    Chronic constipation    Chronic kidney disease, stage 3a (HCC)    Colon polyp 03/23/2011   tubular adenoma, Dr. Jena Gauss   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) 07/15/2011   CVA (cerebral infarction) 1998   right sided deficit   Delayed gastric emptying 2018   Depression    Diverticulosis    TCS 03/23/11 pancolonic diverticula &TCS 5/08, pancolonic diverticula   DM (diabetes mellitus) (HCC)    Dysphagia    Edema of lower extremity 12/21/2012   bilateral    Esotropia of left eye    GERD (gastroesophageal reflux disease)    Glaucoma (increased eye pressure)    Gout    Gout    Hemorrhagic colitis 06/06/2012   Hemorrhoids, internal 03/23/2011   tcs by Dr. Jena Gauss   Hepatitis    esosiniphilic, tx with prednisone   Hiatal hernia    History of radiation therapy 05/21/18- 05/30/18   Left Lung/ 54 Gy delivered in 3 fractions of 18 Gy. SBRT   HTN (hypertension)    Hx of radiation therapy 1974   right base of skull area-lymphoma   Hyperlipidemia    Insomnia    Lower facial weakness    Right   Lymphoma (HCC) 1974   XRT at [facility], right base of skull area   Neuropathy    Non-small cell lung cancer (HCC) dx'd 04/26/18   Peripheral edema    R>L legs   Rash    chronic, recurrent, R>L legs   Renal insufficiency    Steatohepatitis    liver  biopsy 2009   Stroke [facility]) 1998   right hemiparesis/plegia    Past Surgical History:  Procedure Laterality Date   BIOPSY  12/01/2016   Procedure: BIOPSY;  Surgeon: Corbin Ade, MD;  Location: [facility];  Service: Endoscopy;;  duodenum, gastric, esophagus   BIOPSY  07/15/2022   Procedure: BIOPSY;  Surgeon: Corbin Ade, MD;  Location: [facility];  Service: Endoscopy;;  duodenal, mass;   BIOPSY  10/19/2023   Procedure: BIOPSY;  Surgeon: Franky Macho, MD;  Location: [facility];  Service: Endoscopy;;   CHOLECYSTECTOMY     COLONOSCOPY  03/23/11   Dr. Jena Gauss  pancolonic diverticula, hemorrhoids, tubular  adenoma.. next tcs 03/2016   COLONOSCOPY WITH PROPOFOL N/A 12/01/2016   inadequate bowel prep precluded exam   ESOPHAGOGASTRODUODENOSCOPY  02/05/08   goblet cell metaplasia/negative for H.pylori   ESOPHAGOGASTRODUODENOSCOPY  03/23/11   Dr. Jena Gauss, barretts, hiatal hernia   ESOPHAGOGASTRODUODENOSCOPY (EGD) WITH PROPOFOL N/A 12/01/2016   Dr. Jena Gauss: Large amount of retained gastric contents precluded completion of the stomach. Mucosal changes were found in the stomach. Erosions and somewhat scalloped appearing mucosa present, reactive gastritis/no H pylori. Barrett's esophagus noted, no dysplasia on biopsy. Duodenal biopsies taken as well, benign, no evidence of eosinophilia.   ESOPHAGOGASTRODUODENOSCOPY (EGD) WITH PROPOFOL N/A 07/15/2022   Procedure: ESOPHAGOGASTRODUODENOSCOPY (EGD) WITH PROPOFOL;  Surgeon: Corbin Ade, MD;  [facility];  Service: Endoscopy;  Laterality: N/A;   ESOPHAGOGASTRODUODENOSCOPY (EGD) WITH PROPOFOL N/A 10/19/2023   Procedure: ESOPHAGOGASTRODUODENOSCOPY (EGD) WITH PROPOFOL;  Surgeon: Franky Macho, MD;  [facility];  Service: Endoscopy;  Laterality: N/A;   IR FLUORO GUIDED NEEDLE PLC ASPIRATION/INJECTION LOC  12/13/2018   IR KYPHO LUMBAR INC FX REDUCE BONE BX UNI/BIL CANNULATION INC/IMAGING  12/13/2018   IR RADIOLOGY PERIPHERAL GUIDED IV START  12/13/2018   IR US GUIDE VASC ACCESS LEFT  12/13/2018   MASS BIOPSY  11/01/2012   Procedure: NECK MASS BIOPSY;  Surgeon: Darletta Moll, MD;  Location: [facility] ORS;  Service: ENT;  Laterality: Right;  Excisional Bx Right Neck Mass; attempted external jugular cutdown of left side   PAROTIDECTOMY  11/24/2012   Procedure: PAROTIDECTOMY;  Surgeon: Darletta Moll, MD;  Location: [facility] OR;  Service: ENT;  Laterality: N/A;  Total parotidectomy   PLEURECTOMY     right lymph node removal Right    behind right ear   Right video-assisted thoracic surgery, pleurectomy, and pleurodesis  2011   VIDEO BRONCHOSCOPY WITH ENDOBRONCHIAL  NAVIGATION N/A 04/26/2018   Procedure: VIDEO BRONCHOSCOPY WITH ENDOBRONCHIAL NAVIGATION;  Surgeon: Loreli Slot, MD;  Location: [facility] OR;  Service: Thoracic;  Laterality: N/A;     reports that he quit smoking about 26 years ago. His smoking use included cigarettes. He started smoking about 51 years ago. He has a 75 pack-year smoking history. He has never used smokeless tobacco. He reports that he does not drink alcohol and does not use drugs.  No Known Allergies  Family History  Problem Relation Age of Onset   Heart failure Mother    Heart failure Father    Heart failure Sister    Heart failure Son    Colon cancer Neg Hx     Prior to Admission medications   Medication Sig Start Date End Date Taking? Authorizing Provider  furosemide (LASIX) 40 MG tablet Take 40 mg by mouth daily. 01/09/24  Yes [provider]  ondansetron (ZOFRAN) 4 MG tablet Take 4 mg by  mouth every 8 (eight) hours as needed. 01/04/24  Yes [provider]  Potassium Chloride ER 20 MEQ TBCR Take 1 tablet by mouth 2 (two) times daily. 12/31/23  Yes [provider]  acetaminophen (TYLENOL) 650 MG CR tablet Take 650 mg by mouth every 4 (four) hours as needed for pain.    [provider]  allopurinol (ZYLOPRIM) 300 MG tablet Take 0.5 tablets (150 mg total) by mouth daily. 12/23/23   Zigmund Daniel., MD  amLODipine (NORVASC) 10 MG tablet Take 1 tablet (10 mg total) by mouth daily. 12/10/21   Donita Brooks, MD  ASPIRIN ADULT LOW STRENGTH 81 MG tablet Take 1 tablet (81 mg total) by mouth daily. Restart in 1 week 10/23/23   Kendell Bane, MD  b complex-vitamin c-folic acid (NEPHRO-VITE) 0.8 MG TABS tablet Take 1 tablet by mouth at bedtime.    [provider]  Baclofen 5 MG TABS Take 1 tablet (5 mg total) by mouth 3 (three) times daily as needed. 12/22/23   Zigmund Daniel., MD  bisacodyl (DULCOLAX) 10 MG suppository Place 10 mg rectally daily as needed for moderate  constipation.    [provider]  buPROPion ER (WELLBUTRIN SR) 100 MG 12 hr tablet Take 100 mg by mouth 2 (two) times daily. 02/03/22   [provider]  cinacalcet (SENSIPAR) 30 MG tablet Take 30 mg by mouth daily with breakfast. 02/03/22   [provider]  cyanocobalamin (VITAMIN B12) 1000 MCG tablet Take 1 tablet (1,000 mcg total) by mouth daily. 07/17/22   Erick Blinks, MD  gabapentin (NEURONTIN) 300 MG capsule Take 1 capsule (300 mg total) by mouth 2 (two) times daily. 12/22/23   Zigmund Daniel., MD  HUMALOG KWIKPEN 100 UNIT/ML KwikPen Inject into the skin. 12/22/23   [provider]  insulin aspart (NOVOLOG) 100 UNIT/ML injection Inject 0-6 Units into the skin 3 (three) times daily with meals. CBG 70 - 120: 0 units  CBG 121 - 150: 0 units  CBG 151 - 200: 1 unit  CBG 201-250: 2 units  CBG 251-300: 3 units  CBG 301-350: 4 units  CBG 351-400: 5 units  Give 6 units and Call MD if > 400 12/22/23   Zigmund Daniel., MD  insulin glargine-yfgn [facility]) 100 UNIT/ML Pen Inject into the skin. 11/21/23   [provider]  ipratropium-albuterol (DUONEB) 0.5-2.5 (3) MG/3ML SOLN Take 3 mLs by nebulization every 6 (six) hours.    [provider]  iron polysaccharides (NIFEREX) 150 MG capsule TAKE 1 CAPSULE(150 MG) BY MOUTH DAILY Patient taking differently: Take 150 mg by mouth daily. 07/07/21   Donita Brooks, MD  metoprolol tartrate (LOPRESSOR) 50 MG tablet Take 50 mg by mouth daily. 12/10/21   [provider]  ondansetron (ZOFRAN-ODT) 4 MG disintegrating tablet Take 4 mg by mouth every 6 (six) hours as needed for nausea or vomiting.    [provider]  Oxycodone HCl 10 MG TABS Take by mouth. 01/09/24   [provider]  oxyCODONE-acetaminophen (PERCOCET) 7.5-325 MG tablet Take 1 tablet by mouth in the morning and at bedtime.    [provider]  pantoprazole (PROTONIX) 40 MG tablet Take 1 tablet (40 mg  total) by mouth 2 (two) times daily. 10/20/23   Shahmehdi, Gemma Payor, MD  polyethylene glycol powder (MIRALAX) 17 GM/SCOOP powder Take one capful daily. Hold for diarrhea. Patient taking differently: Take 17 g by mouth daily. Hold for diarrhea.  08/24/22   Tiffany Kocher, PA-C  Prucalopride Succinate (MOTEGRITY) 2 MG TABS Take 1 tablet (2 mg total) by mouth daily. 11/22/22   Rourk, Gerrit Friends, MD  rosuvastatin (CRESTOR) 20 MG tablet Take 20 mg by mouth daily.    [provider]  sucralfate (CARAFATE) 1 GM/10ML suspension Take 10 mLs (1 g total) by mouth 4 (four) times daily -  with meals and at bedtime for 14 days. 12/22/23 01/05/24  Zigmund Daniel., MD  tamsulosin (FLOMAX) 0.4 MG CAPS capsule TAKE 1 CAPSULE(0.4 MG) BY MOUTH DAILY Patient taking differently: Take 0.4 mg by mouth daily. 12/10/21   Donita Brooks, MD  torsemide (DEMADEX) 20 MG tablet Take 40 mg by mouth 2 (two) times daily. 08/26/23   [provider]  traZODone (DESYREL) 100 MG tablet Take 100 mg by mouth at bedtime. 03/07/22   [provider]    Physical Exam: Vitals:   02/01/24 1900 02/01/24 2000 02/01/24 2100 02/01/24 2145  BP: (!) 176/104 (!) 155/91 (!) 174/98   Pulse: 81 78  85  Resp: 15 10  18   Temp:  98.3 F (36.8 C)    TempSrc:      SpO2: 100% 98%  100%  Weight:      Height:        Constitutional: NAD, calm, comfortable Vitals:   02/01/24 1900 02/01/24 2000 02/01/24 2100 02/01/24 2145  BP: (!) 176/104 (!) 155/91 (!) 174/98   Pulse: 81 78  85  Resp: 15 10  18   Temp:  98.3 F (36.8 C)    TempSrc:      SpO2: 100% 98%  100%  Weight:      Height:       Eyes: Abnormal left eye, baseline, lids and conjunctivae normal ENMT: Mucous membranes are dry  Neck: normal, supple, no masses, no thyromegaly Respiratory: clear to auscultation bilaterally, no wheezing, no crackles. Normal respiratory effort. No accessory muscle use.  Cardiovascular: Regular rate and rhythm, no murmurs / rubs /  gallops.  1+ pitting bilateral lower extremity edema to knees, worse on the right,   Extremities warm.  Also swelling to right upper extremity. Abdomen: no tenderness, no masses palpated. No hepatosplenomegaly. Bowel sounds positive.  Musculoskeletal: no clubbing / cyanosis. No joint deformity upper and lower extremities.  Skin: no rashes, lesions, ulcers. No induration Neurologic: Mildly slurred and delayed speech, dense hemiplegia to right upper and lower extremity. Psychiatric: Normal judgment and insight. Alert and oriented x 3. Normal mood.   Labs on Admission: I have personally reviewed following labs and imaging studies  CBC: Recent Labs  Lab 02/01/24 1647  WBC 9.9  NEUTROABS 8.2*  HGB 9.4*  HCT 30.0*  MCV 68.6*  PLT 307   Basic Metabolic Panel: Recent Labs  Lab 02/01/24 1647  NA 134*  K 2.6*  CL 97*  CO2 28  GLUCOSE 209*  BUN 19  CREATININE 1.59*  CALCIUM 7.6*  MG 1.0*   GFR: Estimated Creatinine Clearance: 47.9 mL/min (A) (by C-G formula based on SCr of 1.59 mg/dL (H)). Liver Function Tests: Recent Labs  Lab 02/01/24 1647  AST 10*  ALT 9  ALKPHOS 82  BILITOT 0.4  PROT 5.1*  ALBUMIN 2.0*   Urine analysis:    Component Value Date/Time   COLORURINE STRAW (A) 02/01/2024 2119   APPEARANCEUR CLEAR 02/01/2024 2119   LABSPEC 1.015 02/01/2024 2119   PHURINE 6.0 02/01/2024 2119   GLUCOSEU 50 (A) 02/01/2024 2119   HGBUR  SMALL (A) 02/01/2024 2119   BILIRUBINUR NEGATIVE 02/01/2024 2119   KETONESUR NEGATIVE 02/01/2024 2119   PROTEINUR 100 (A) 02/01/2024 2119   UROBILINOGEN 0.2 06/20/2014 0135   NITRITE NEGATIVE 02/01/2024 2119   LEUKOCYTESUR NEGATIVE 02/01/2024 2119    Radiological Exams on Admission: DG Chest Port 1 View Result Date: 02/01/2024 CLINICAL DATA:  Shortness of breath with chest and abdominal pain. EXAM: PORTABLE CHEST 1 VIEW COMPARISON:  12/16/2023. FINDINGS: The heart size and mediastinal contours are stable stable bandlike opacity is noted  in the left upper lobe. There are surgical changes in the mid right lung. No acute infiltrate, effusion or pneumothorax is seen. No acute osseous abnormality. IMPRESSION: 1. No acute abnormality. 2. Stable bandlike opacity in the left upper lobe. Electronically Signed   By: Thornell Sartorius M.D.   On: 02/01/2024 19:30   CT ABDOMEN PELVIS W CONTRAST Result Date: 02/01/2024 CLINICAL DATA:  Abdominal pain. EXAM: CT ABDOMEN AND PELVIS WITH CONTRAST TECHNIQUE: Multidetector CT imaging of the abdomen and pelvis was performed using the standard protocol following bolus administration of intravenous contrast. RADIATION DOSE REDUCTION: This exam was performed according to the departmental dose-optimization program which includes automated exposure control, adjustment of the mA and/or kV according to patient size and/or use of iterative reconstruction technique. CONTRAST:  80mL OMNIPAQUE IOHEXOL 300 MG/ML  SOLN COMPARISON:  CT abdomen pelvis dated December 16, 2023. FINDINGS: Lower chest: New small bilateral pleural effusions. New peribronchial thickening and patchy consolidation in the posterior right lower lobe. Hepatobiliary: No focal liver abnormality is seen. Status post cholecystectomy. No biliary dilatation. Pancreas: Unremarkable. No pancreatic ductal dilatation or surrounding inflammatory changes. Spleen: 1.9 cm round hypodense lesion in the medial spleen, previously 1.8 cm in August 2023, indeterminate but benign given stability. Normal in size. Adrenals/Urinary Tract: Bilateral adrenal gland thickening again noted. Multiple bilateral renal simple cysts again noted. No follow-up imaging is recommended. No renal calculi or hydronephrosis. The bladder is unremarkable. Stomach/Bowel: Distended stomach. Unremarkable small bowel. Wall thickening of the rectosigmoid colon. Diffuse colonic diverticulosis. Normal appendix. Vascular/Lymphatic: Aortic atherosclerosis. No enlarged abdominal or pelvic lymph nodes. Reproductive:  Unchanged mild prostatomegaly. Other: No free fluid or pneumoperitoneum. Musculoskeletal: No acute or significant osseous findings. IMPRESSION: 1. Wall thickening of the rectosigmoid colon, suggestive of proctocolitis. 2. New right lower lobe pneumonia with small bilateral pleural effusions. 3.  Aortic Atherosclerosis (ICD10-I70.0). Electronically Signed   By: Obie Dredge M.D.   On: 02/01/2024 18:26    EKG: None  Assessment/Plan Principal Problem:   Proctocolitis Active Problems:   Electrolyte abnormality   Lobar pneumonia (HCC)   Type 2 diabetes mellitus (HCC)   Gout   Hemiplegia of dominant side as late effect of cerebrovascular disease (HCC)   Esotropia of left eye   Benign prostatic hyperplasia   Chronic kidney disease, stage 3 unspecified (HCC)   Chronic obstructive pulmonary disease (HCC)   MGUS (monoclonal gammopathy of unknown significance)  Assessment and Plan: * Proctocolitis Presenting with multiple episodes of vomiting, diarrhea and abdominal pain.  Will start for sepsis. -Stool C. difficile GI pathogen panel -1.5 L bolus given, hold off on further fluids at this time. -Hold home Lasix 40 mg daily for now  Lobar pneumonia (HCC) Likely aspiration pneumonia.  Presenting with multiple episodes of vomiting, with baseline dysphagia.  CT showing new right lower lobe pneumonia.  RSV positive.  Rules out for sepsis.  Afebrile.  WBC 9.9. -IV ceftriaxone and metronidazole started for proctocolitis -Add azithromycin for CAP coverage -Mucolytics -  Check Qtc on EKG  Electrolyte abnormality Hypokalemia of 2.6, hypomagnesemia of 1.  Likely secondary to diuretics. -Replete electrolytes  Chronic obstructive pulmonary disease (HCC) Stable.  Presenting with pneumonia and RSV infection. -Resume home regimen  Chronic kidney disease, stage 3 unspecified (HCC) CKD stage IIIb.  Stable.  Creatinine 1.59.  Benign prostatic hyperplasia Resume tamsulosin  Hemiplegia of dominant  side as late effect of cerebrovascular disease (HCC) Nursing home resident.  With swelling to right upper extremity and bilateral lower extremities, worse on the right, likely secondary to hypoalbuminemia- 2. -Right upper extremity venous Dopplers -Resume home Lasix 40 mg daily in a.m.  Gout Resume allopurinol  Type 2 diabetes mellitus (HCC) - Controlled.  A1c 6.7.   - SSI- S -Hold home insulins for now  Hospice patient  DVT prophylaxis: Lovenox Code Status: DNR-MOST form at bedside.  Confirmed validity with both patient and spouse at bedside.  ACP documents reviewed.  DNR form on file. Family Communication: Spouse at bedside Disposition Plan: ~ 2 days Consults called: None Admission status: Inpt Tele I certify that at the point of admission it is my clinical judgment that the patient will require inpatient hospital care spanning beyond 2 midnights from the point of admission due to high intensity of service, high risk for further deterioration and high frequency of surveillance required.    Author: Onnie Boer, MD 02/01/2024 11:24 PM  For on call review [email or web address].

## 2024-02-01 NOTE — Assessment & Plan Note (Signed)
 Resume tamsulosin

## 2024-02-01 NOTE — Assessment & Plan Note (Addendum)
Presenting with multiple episodes of vomiting, diarrhea and abdominal pain.  Will start for sepsis. -Stool C. difficile GI pathogen panel -1.5 L bolus given, hold off on further fluids at this time. -Hold home Lasix 40 mg daily for now

## 2024-02-01 NOTE — Progress Notes (Signed)
[employer] engineer, [employer] Hospice liaison note     This patient is a current hospice patient with [facility].    Liaison will continue to follow for any discharge planning needs and to coordinate continuation of hospice care.    Please don't hesitate to call with any Hospice related questions or concerns.   Glenna Fellows, BSN, RN, OCN [facility] Liaison [phone]  (Current hospice medication list)

## 2024-02-01 NOTE — Assessment & Plan Note (Signed)
-   Controlled.  A1c 6.7.   - SSI- S -Hold home insulins for now

## 2024-02-01 NOTE — Assessment & Plan Note (Addendum)
Nursing home resident.  With swelling to right upper extremity and bilateral lower extremities, worse on the right, likely secondary to hypoalbuminemia- 2. -Right upper extremity venous Dopplers -Resume home Lasix 40 mg daily in a.m.

## 2024-02-02 ENCOUNTER — Inpatient Hospital Stay (HOSPITAL_COMMUNITY)

## 2024-02-02 DIAGNOSIS — K529 Noninfective gastroenteritis and colitis, unspecified: Secondary | ICD-10-CM | POA: Diagnosis not present

## 2024-02-02 LAB — CBC
HCT: 30.1 % — ABNORMAL LOW (ref 39.0–52.0)
Hemoglobin: 9.4 g/dL — ABNORMAL LOW (ref 13.0–17.0)
MCH: 21.3 pg — ABNORMAL LOW (ref 26.0–34.0)
MCHC: 31.2 g/dL (ref 30.0–36.0)
MCV: 68.3 fL — ABNORMAL LOW (ref 80.0–100.0)
Platelets: 309 10*3/uL (ref 150–400)
RBC: 4.41 MIL/uL (ref 4.22–5.81)
RDW: 23 % — ABNORMAL HIGH (ref 11.5–15.5)
WBC: 9.4 10*3/uL (ref 4.0–10.5)
nRBC: 0 % (ref 0.0–0.2)

## 2024-02-02 LAB — GASTROINTESTINAL PANEL BY PCR, STOOL (REPLACES STOOL CULTURE)

## 2024-02-02 LAB — CBG MONITORING, ED
Glucose-Capillary: 119 mg/dL — ABNORMAL HIGH (ref 70–99)
Glucose-Capillary: 151 mg/dL — ABNORMAL HIGH (ref 70–99)

## 2024-02-02 LAB — BASIC METABOLIC PANEL
Anion gap: 7 (ref 5–15)
BUN: 17 mg/dL (ref 8–23)
CO2: 26 mmol/L (ref 22–32)
Calcium: 7.6 mg/dL — ABNORMAL LOW (ref 8.9–10.3)
Chloride: 102 mmol/L (ref 98–111)
Creatinine, Ser: 1.43 mg/dL — ABNORMAL HIGH (ref 0.61–1.24)
GFR, Estimated: 50 mL/min — ABNORMAL LOW (ref >60)
Glucose, Bld: 142 mg/dL — ABNORMAL HIGH (ref 70–99)
Potassium: 2.9 mmol/L — ABNORMAL LOW (ref 3.5–5.1)
Sodium: 135 mmol/L (ref 135–145)

## 2024-02-02 LAB — C DIFFICILE QUICK SCREEN W PCR REFLEX
C Diff antigen: POSITIVE — AB
C Diff toxin: NEGATIVE

## 2024-02-02 LAB — GLUCOSE, CAPILLARY
Glucose-Capillary: 139 mg/dL — ABNORMAL HIGH (ref 70–99)
Glucose-Capillary: 149 mg/dL — ABNORMAL HIGH (ref 70–99)

## 2024-02-02 LAB — CLOSTRIDIUM DIFFICILE BY PCR, REFLEXED: Toxigenic C. Difficile by PCR: NEGATIVE

## 2024-02-02 LAB — MAGNESIUM: Magnesium: 1.2 mg/dL — ABNORMAL LOW (ref 1.7–2.4)

## 2024-02-02 MED ORDER — HYDRALAZINE HCL 20 MG/ML IJ SOLN
10.0000 mg | Freq: Four times a day (QID) | INTRAMUSCULAR | Status: DC | PRN
  Administered 2024-02-02: 10 mg via INTRAVENOUS
  Filled 2024-02-02: qty 1

## 2024-02-02 MED ORDER — APIXABAN 5 MG PO TABS
10.0000 mg | ORAL_TABLET | Freq: Two times a day (BID) | ORAL | Status: DC
  Administered 2024-02-02 – 2024-02-03 (×2): 10 mg via ORAL
  Filled 2024-02-02 (×2): qty 2

## 2024-02-02 MED ORDER — LABETALOL HCL 5 MG/ML IV SOLN: 10.0000 mg | INTRAVENOUS | Status: DC | PRN

## 2024-02-02 MED ORDER — APIXABAN 5 MG PO TABS: 5.0000 mg | ORAL_TABLET | Freq: Two times a day (BID) | ORAL | Status: DC

## 2024-02-02 MED ORDER — METRONIDAZOLE 500 MG/100ML IV SOLN
500.0000 mg | Freq: Two times a day (BID) | INTRAVENOUS | Status: DC
  Administered 2024-02-02: 500 mg via INTRAVENOUS
  Filled 2024-02-02: qty 100

## 2024-02-02 MED ORDER — POTASSIUM CHLORIDE CRYS ER 20 MEQ PO TBCR
40.0000 meq | EXTENDED_RELEASE_TABLET | Freq: Two times a day (BID) | ORAL | Status: DC
  Administered 2024-02-02 – 2024-02-03 (×3): 40 meq via ORAL
  Filled 2024-02-02 (×3): qty 2

## 2024-02-02 MED ORDER — ORAL CARE MOUTH RINSE: 15.0000 mL | OROMUCOSAL | Status: DC | PRN

## 2024-02-02 MED ORDER — ACETAMINOPHEN 325 MG PO TABS: 650.0000 mg | ORAL_TABLET | Freq: Four times a day (QID) | ORAL | Status: DC | PRN

## 2024-02-02 MED ORDER — SODIUM CHLORIDE 0.9 % IV SOLN
2.0000 g | INTRAVENOUS | Status: DC
  Administered 2024-02-02: 2 g via INTRAVENOUS
  Filled 2024-02-02: qty 20

## 2024-02-02 MED ORDER — HYDROCODONE-ACETAMINOPHEN 5-325 MG PO TABS
1.0000 | ORAL_TABLET | Freq: Four times a day (QID) | ORAL | Status: DC | PRN
  Administered 2024-02-03 – 2024-02-05 (×2): 1 via ORAL
  Filled 2024-02-02 (×2): qty 1

## 2024-02-02 MED ORDER — SODIUM CHLORIDE 0.9 % IV SOLN
500.0000 mg | Freq: Every day | INTRAVENOUS | Status: DC
  Administered 2024-02-02 (×2): 500 mg via INTRAVENOUS
  Filled 2024-02-02 (×2): qty 5

## 2024-02-02 NOTE — Progress Notes (Signed)
[facility] Hospitalized Hospice Patient Visit  [name] is a current hospice patient, with hospice diagnosis of Gastrointestinal hemorrhage, unspecified. He was admitted to [facility] 2.20.2025 with diagnosis of proctocolitis and symptoms including abdomen pain and vomiting. [facility] was notified that patient was being sent to the ED. Per Dr. Rosanne Sack, hospice physician, this is a related hospital admission.   Visited with patient while still in ED with wife at bedside. He was having ultrasound doppler evaluation during my visit. [name] reports he is not feeling better yet and still having abdominal discomfort. Reviewed plan of care with Ms. [name].  Patient is GIP appropriate due to need for evaluation of abdominal pain and IV fluids and medications.   Vital Signs: 98.3/84/12   175/105   O2 100% on RA  I&O: 1691/not documented  Abnormal labs: NA 124, K+ 2.6, Cre 1.59, CA 7.6, Mag 1.0, Alb 2.0, Ast 10, T. Pro 5.1, GFR e 44, HGB 9.4, Resp panel positive for respiratory syncytial virus, Cdiff positive for antigen only, UA positive for protein and glucose as well as rare bacteria and mucus.  Diagnostics:  RUE [place] evaluation for DVT, in progress Portable chest xray,  1. No acute abnormality. 2. Stable bandlike opacity in the left upper lobe.   CT Abd and Pelvis, 1. Wall thickening of the rectosigmoid colon, suggestive of proctocolitis. 2. New right lower lobe pneumonia with small bilateral pleural effusions. 3.  Aortic Atherosclerosis (ICD10-I70.0).  IV/PRN Meds: NS fluid bolus , KCL IV x 5, Flagyl 500mg  IV x 1, MgSo4 2G IV x 1, Rocephin 2G IV x 1, Zithromycin 500mg  IV x 1, Zofran 4mg  IV x 1, Dilaudid 0.5mg  IV x 1    Assessment and Plan:  Proctocolitis Presenting with multiple episodes of vomiting, diarrhea and abdominal pain.  Will start for sepsis. -Stool C. difficile GI pathogen panel -1.5 L bolus given, hold off on further fluids at  this time. -Hold home Lasix 40 mg daily for now   Lobar pneumonia (HCC) Likely aspiration pneumonia.  Presenting with multiple episodes of vomiting, with baseline dysphagia.  CT showing new right lower lobe pneumonia.  RSV positive.  Rules out for sepsis.  Afebrile.  WBC 9.9. -IV ceftriaxone and metronidazole started for proctocolitis -Add azithromycin for CAP coverage -Mucolytics - Check Qtc on EKG   Electrolyte abnormality Hypokalemia of 2.6, hypomagnesemia of 1.  Likely secondary to diuretics. -Replete electrolytes   Chronic obstructive pulmonary disease (HCC) Stable.  Presenting with pneumonia and RSV infection. -Resume home regimen   Chronic kidney disease, stage 3 unspecified (HCC) CKD stage IIIb.  Stable.  Creatinine 1.59.    Hemiplegia of dominant side as late effect of cerebrovascular disease (HCC) Nursing home resident.  With swelling to right upper extremity and bilateral lower extremities, worse on the right, likely secondary to hypoalbuminemia- 2. -Right upper extremity venous Dopplers -Resume home Lasix 40 mg daily in a.m.   Discharge Planning: Ongoing  Family Contact: spoke with spouse at bedside  IDT: Updated  Goals of Care: DNR  [name] BSN, RN, [facility] Hospice hospital liaison [phone]

## 2024-02-02 NOTE — Progress Notes (Signed)
PROGRESS NOTE    [name]  YNW:[id number] DOB: 05-30-1946 DOA: 02/01/2024 PCP: System, Provider Not In  Chief Complaint  Patient presents with   Abdominal Pain    Hospital Course:  [name] is 78 y.o. male with history of CVA, residual right-sided hemiplegia and dysphagia, COPD, gout, hypertension, MGUS, diabetes, hypothyroidism, CKD stage IIIb, BPH, who currently resides in a nursing home under hospice care.  He presents on this admission with vomiting, diarrhea, abdominal pain, and right upper extremity swelling.  In the ED he was found to be hypertensive, hypokalemic.  CT abdomen pelvis revealed rectosigmoid colon thickening suggestive of proctocolitis and a new right lower lobe pneumonia with small bilateral pleural effusions.  Patient was also found to be positive for RSV. He was Started on antibiotics and admitted for treatment  Subjective: On evaluation patient reports that he is ready to "go to heaven."  His wife is at bedside and endorses frustrations with the care he has been receiving at the nursing home.  She reports she she does not agree with the hospice philosophy and does not believe that he should be left without aggressive treatment.  At this time she would like for him to continue to receive all aggressive care.   Objective: Vitals:   02/02/24 0500 02/02/24 0636 02/02/24 0637 02/02/24 0752  BP: (!) 175/105     Pulse: 84  85   Resp: 12  15   Temp:  98.3 F (36.8 C)    TempSrc:  Oral    SpO2: 98%  96% 100%  Weight:      Height:        Intake/Output Summary (Last 24 hours) at 02/02/2024 0816 Last data filed at 02/02/2024 0511 Gross per 24 hour  Intake 1691.37 ml  Output --  Net 1691.37 ml   Filed Weights   02/01/24 1608  Weight: 101.4 kg    Examination: General exam: Appears calm and comfortable, weak appearing, diffusely anasarcic Respiratory system: No work of breathing, symmetric chest wall expansion, respirations Cardiovascular system: S1 &  S2 heard, RRR.  Gastrointestinal system: Abdomen is nondistended, soft and nontender.  Neuro: Alert and oriented.  Speech is slurred and at times unintelligible, does require prompting Extremities: Symmetric, expected ROM Skin: No rashes, lesions Psychiatry:  Mood & affect appropriate for situation.   Assessment & Plan:  Principal Problem:   Proctocolitis Active Problems:   Electrolyte abnormality   Lobar pneumonia (HCC)   Type 2 diabetes mellitus (HCC)   Gout   Hemiplegia of dominant side as late effect of cerebrovascular disease (HCC)   Esotropia of left eye   Benign prostatic hyperplasia   Chronic kidney disease, stage 3 unspecified (HCC)   Chronic obstructive pulmonary disease (HCC)   MGUS (monoclonal gammopathy of unknown significance)    Hospice patient - Currently resides in nursing home under [facility] hospice. - At this time the patient's wife is unsure if she like to proceed with hospice care.  The patient's hospice liaison has been reengaged and they are assisting with continued goals of care conversations - I discussed again with the patient and his wife today that his prognosis is poor in light of his chronic conditions and frequent hospitalizations.  They endorsed understanding.  RSV positive - Afebrile, no leukocytosis - Continue with supportive care  Colitis - Presents with vomiting, diarrhea, abdominal pain - C. difficile negative, GI pathogen panel negative - Status post 1.5 L IV fluids, hold off on further fluids given  anasarca - Hold home Lasix currently - On ceftriaxone and Flagyl currently, will discontinue given no evidence of bacterial colitis - Suspect this is viral   Lobar pneumonia - Likely aspiration pneumonia, has baseline dysphagia.  Has had multiple admissions for aspiration pneumonia in the past. Also likely increased secretions given RSV - CT showing new right lower lobe pneumonia - On ceftriaxone and azithromycin added for  pneumonia coverage, will continue - Continue mucolytics  Electrolyte abnormality Hypokalemia Hypomagnesemia - Likely secondary to diuretics - Replace as needed - Trend CMP  COPD - Complicated by pneumonia and RSV - Continue home meds - As needed DuoNebs  BPH - Continue Flomax  Hemiplegia of dominant side as late effect of cerebrovascular disease - Resides in a nursing home  Right brachial vein DVT -Will initiate Eliquis at loading dose  Anasarca - secondary to hypoalbuminemia - Resume home dose Lasix   Gout - Resume allopurinol  Type 2 diabetes - Hemoglobin A1c 6.7 - Sliding scale insulin as needed  CKD stage IIIb - Cr at baseline  Small cell lung cancer status post radiation therapy MGUS with possible conversion to multiple myeloma - Follows outpatient oncology, not felt to be a candidate for additional treatments  C. difficile positive -Antigen positive, toxin negative - Continue enteric precautions for now  DVT prophylaxis: On full dose Eliquis for DVT as above   Code Status: Limited: Do not attempt resuscitation (DNR) -DNR-LIMITED -Do Not Intubate/DNI  Family Communication:  Discussed directly with patient and his wife at bedside. Disposition:  Inpatient still hospitalized for ongoing management, will discharge to NH possibly with hospice pending further discussions with family.   Consultants:    Procedures:    Antimicrobials:  Anti-infectives (From admission, onward)    Start     Dose/Rate Route Frequency Ordered Stop   02/02/24 2200  cefTRIAXone (ROCEPHIN) 2 g in sodium chloride 0.9 % 100 mL IVPB        2 g 200 mL/hr over 30 Minutes Intravenous Every 24 hours 02/02/24 0219     02/02/24 1000  metroNIDAZOLE (FLAGYL) IVPB 500 mg        500 mg 100 mL/hr over 60 Minutes Intravenous Every 12 hours 02/02/24 0219     02/02/24 0230  azithromycin (ZITHROMAX) 500 mg in sodium chloride 0.9 % 250 mL IVPB        500 mg 250 mL/hr over 60 Minutes Intravenous  Daily at bedtime 02/02/24 0219     02/01/24 2030  metroNIDAZOLE (FLAGYL) IVPB 500 mg        500 mg 100 mL/hr over 60 Minutes Intravenous  Once 02/01/24 2023 02/01/24 2234   02/01/24 1900  cefTRIAXone (ROCEPHIN) 2 g in sodium chloride 0.9 % 100 mL IVPB        2 g 200 mL/hr over 30 Minutes Intravenous  Once 02/01/24 1851 02/01/24 2127       Data Reviewed: I have personally reviewed following labs and imaging studies CBC: Recent Labs  Lab 02/01/24 1647 02/02/24 0634  WBC 9.9 9.4  NEUTROABS 8.2*  --   HGB 9.4* 9.4*  HCT 30.0* 30.1*  MCV 68.6* 68.3*  PLT 307 309   Basic Metabolic Panel: Recent Labs  Lab 02/01/24 1647 02/02/24 0634  NA 134* 135  K 2.6* 2.9*  CL 97* 102  CO2 28 26  GLUCOSE 209* 142*  BUN 19 17  CREATININE 1.59* 1.43*  CALCIUM 7.6* 7.6*  MG 1.0* 1.2*   GFR: Estimated Creatinine Clearance: 53.3 mL/min (  A) (by C-G formula based on SCr of 1.43 mg/dL (H)). Liver Function Tests: Recent Labs  Lab 02/01/24 1647  AST 10*  ALT 9  ALKPHOS 82  BILITOT 0.4  PROT 5.1*  ALBUMIN 2.0*   CBG: Recent Labs  Lab 02/01/24 2346  GLUCAP 181*    Recent Results (from the past 240 hours)  Resp panel by RT-PCR (RSV, Flu A&B, Covid) Anterior Nasal Swab     Status: Abnormal   Collection Time: 02/01/24  7:44 PM   Specimen: Anterior Nasal Swab  Result Value Ref Range Status   SARS Coronavirus 2 by RT PCR NEGATIVE NEGATIVE Final    Comment: (NOTE) SARS-CoV-2 target nucleic acids are NOT DETECTED.  The SARS-CoV-2 RNA is generally detectable in upper respiratory specimens during the acute phase of infection. The lowest concentration of SARS-CoV-2 viral copies this assay can detect is 138 copies/mL. A negative result does not preclude SARS-Cov-2 infection and should not be used as the sole basis for treatment or other patient management decisions. A negative result may occur with  improper specimen collection/handling, submission of specimen other than nasopharyngeal  swab, presence of viral mutation(s) within the areas targeted by this assay, and inadequate number of viral copies(<138 copies/mL). A negative result must be combined with clinical observations, patient history, and epidemiological information. The expected result is Negative.  Fact Sheet for Patients:  BloggerCourse.com  Fact Sheet for Healthcare Providers:  SeriousBroker.it  This test is no t yet approved or cleared by the [place] FDA and  has been authorized for detection and/or diagnosis of SARS-CoV-2 by FDA under an Emergency Use Authorization (EUA). This EUA will remain  in effect (meaning this test can be used) for the duration of the COVID-19 declaration under Section 564(b)(1) of the Act, 21 U.S.C.section 360bbb-3(b)(1), unless the authorization is terminated  or revoked sooner.       Influenza A by PCR NEGATIVE NEGATIVE Final   Influenza B by PCR NEGATIVE NEGATIVE Final    Comment: (NOTE) The Xpert Xpress SARS-CoV-2/FLU/RSV plus assay is intended as an aid in the diagnosis of influenza from Nasopharyngeal swab specimens and should not be used as a sole basis for treatment. Nasal washings and aspirates are unacceptable for Xpert Xpress SARS-CoV-2/FLU/RSV testing.  Fact Sheet for Patients: BloggerCourse.com  Fact Sheet for Healthcare Providers: SeriousBroker.it  This test is not yet approved or cleared by the [place] FDA and has been authorized for detection and/or diagnosis of SARS-CoV-2 by FDA under an Emergency Use Authorization (EUA). This EUA will remain in effect (meaning this test can be used) for the duration of the COVID-19 declaration under Section 564(b)(1) of the Act, 21 U.S.C. section 360bbb-3(b)(1), unless the authorization is terminated or revoked.     Resp Syncytial Virus by PCR POSITIVE (A) NEGATIVE Final    Comment: (NOTE) Fact Sheet  for Patients: BloggerCourse.com  Fact Sheet for Healthcare Providers: SeriousBroker.it  This test is not yet approved or cleared by the [place] FDA and has been authorized for detection and/or diagnosis of SARS-CoV-2 by FDA under an Emergency Use Authorization (EUA). This EUA will remain in effect (meaning this test can be used) for the duration of the COVID-19 declaration under Section 564(b)(1) of the Act, 21 U.S.C. section 360bbb-3(b)(1), unless the authorization is terminated or revoked.  Performed at [facility], [address] [place], [place] [address]   C Difficile Quick Screen w PCR reflex     Status: Abnormal   Collection Time: 02/02/24  3:27 AM   Specimen: Stool  Result Value Ref Range Status   C Diff antigen POSITIVE (A) NEGATIVE Final   C Diff toxin NEGATIVE NEGATIVE Final   C Diff interpretation Results are indeterminate. See PCR results.  Final    Comment: Performed at [facility], [address] [place], [place] [address]     Radiology Studies: [facility] Chest [address] [facility] 1 View Result Date: 02/01/2024 CLINICAL DATA:  Shortness of breath with chest and abdominal pain. EXAM: PORTABLE CHEST 1 VIEW COMPARISON:  12/16/2023. FINDINGS: The heart size and mediastinal contours are stable stable bandlike opacity is noted in the left upper lobe. There are surgical changes in the mid right lung. No acute infiltrate, effusion or pneumothorax is seen. No acute osseous abnormality. IMPRESSION: 1. No acute abnormality. 2. Stable bandlike opacity in the left upper lobe. Electronically Signed   By: Thornell Sartorius M.D.   On: 02/01/2024 19:30   CT ABDOMEN PELVIS W CONTRAST Result Date: 02/01/2024 CLINICAL DATA:  Abdominal pain. EXAM: CT ABDOMEN AND PELVIS WITH CONTRAST TECHNIQUE: Multidetector CT imaging of the abdomen and pelvis was performed using the standard protocol following bolus administration of intravenous contrast. RADIATION DOSE  REDUCTION: This exam was performed according to the departmental dose-optimization program which includes automated exposure control, adjustment of the mA and/or kV according to patient size and/or use of iterative reconstruction technique. CONTRAST:  80mL OMNIPAQUE IOHEXOL 300 MG/ML  SOLN COMPARISON:  CT abdomen pelvis dated December 16, 2023. FINDINGS: Lower chest: New small bilateral pleural effusions. New peribronchial thickening and patchy consolidation in the posterior right lower lobe. Hepatobiliary: No focal liver abnormality is seen. Status post cholecystectomy. No biliary dilatation. Pancreas: Unremarkable. No pancreatic ductal dilatation or surrounding inflammatory changes. Spleen: 1.9 cm round hypodense lesion in the medial spleen, previously 1.8 cm in August 2023, indeterminate but benign given stability. Normal in size. Adrenals/Urinary Tract: Bilateral adrenal gland thickening again noted. Multiple bilateral renal simple cysts again noted. No follow-up imaging is recommended. No renal calculi or hydronephrosis. The bladder is unremarkable. Stomach/Bowel: Distended stomach. Unremarkable small bowel. Wall thickening of the rectosigmoid colon. Diffuse colonic diverticulosis. Normal appendix. Vascular/Lymphatic: Aortic atherosclerosis. No enlarged abdominal or pelvic lymph nodes. Reproductive: Unchanged mild prostatomegaly. Other: No free fluid or pneumoperitoneum. Musculoskeletal: No acute or significant osseous findings. IMPRESSION: 1. Wall thickening of the rectosigmoid colon, suggestive of proctocolitis. 2. New right lower lobe pneumonia with small bilateral pleural effusions. 3.  Aortic Atherosclerosis (ICD10-I70.0). Electronically Signed   By: Obie Dredge M.D.   On: 02/01/2024 18:26    Scheduled Meds:  allopurinol  150 mg Oral Daily   amLODipine  10 mg Oral Daily   enoxaparin (LOVENOX) injection  40 mg Subcutaneous Q24H   furosemide  40 mg Oral Daily   insulin aspart  0-5 Units  Subcutaneous QHS   insulin aspart  0-9 Units Subcutaneous TID WC   ipratropium-albuterol  3 mL Nebulization Q6H   metoprolol tartrate  50 mg Oral Daily   pantoprazole  40 mg Oral BID   tamsulosin  0.4 mg Oral Daily   Continuous Infusions:  azithromycin Stopped (02/02/24 0511)   cefTRIAXone (ROCEPHIN)  IV     metronidazole       LOS: 1 day    Total time spent coordinating care:   Debarah Crape, DO [facility]  To contact the attending physician between 7A-7P please use Epic Chat. To contact the covering physician during after hours 7P-7A, please review Amion.   02/02/2024, 8:16 AM   *  This document has been created with the assistance of dictation software. Please excuse typographical errors. *

## 2024-02-02 NOTE — TOC Initial Note (Signed)
Transition of Care [facility]) - Initial/Assessment Note    Patient Details  Name: [name] MRN: [id number] Date of Birth: 1946-10-08  [facility]) CM/SW Contact:    Isabella Bowens, LCSWA Phone Number: 02/02/2024, 2:00 PM  Clinical Narrative:                 CSW spoke with patient and spouse at bedside . Patient is a long-term resident at [facility] and will DC back once medically ready . Patient is also affiliated with [facility] for hospice. Debbie at [facility] stated that patient can return back once medically stable with hospice care. TOC will continue to follow.   Expected Discharge Plan: Skilled Nursing Facility Barriers to Discharge: Continued Medical Work up   Patient Goals and CMS Choice Patient states their goals for this hospitalization and ongoing recovery are:: DC back to [facility] CMS Medicare.gov Compare Post Acute Care list provided to:: Patient Represenative (must comment) [name] -Spouse) Choice offered to / list presented to : Spouse [name] -Spouse) [facility] ownership interest in [facility].provided to:: Spouse    Expected Discharge Plan and Services In-house Referral: Clinical Social Work   Post Acute Care Choice: Skilled Nursing Facility Living arrangements for the past 2 months: Skilled Nursing Facility                                      Prior Living Arrangements/Services Living arrangements for the past 2 months: Skilled Nursing Facility Lives with:: Facility Resident Patient language and need for interpreter reviewed:: Yes Do you feel safe going back to the place where you live?: Yes      Need for Family Participation in Patient Care: Yes (Comment) Care giver support system in place?: Yes (comment)   Criminal Activity/Legal Involvement Pertinent to Current Situation/Hospitalization: No - Comment as needed  Activities of Daily Living   ADL Screening (condition at time of admission) Independently performs ADLs?: No Does the  patient have a NEW difficulty with bathing/dressing/toileting/self-feeding that is expected to last >3 days?: Yes (Initiates electronic notice to provider for possible OT consult) Does the patient have a NEW difficulty with getting in/out of bed, walking, or climbing stairs that is expected to last >3 days?: Yes (Initiates electronic notice to provider for possible PT consult) Does the patient have a NEW difficulty with communication that is expected to last >3 days?: Yes (Initiates electronic notice to provider for possible SLP consult) Is the patient deaf or have difficulty hearing?: No Does the patient have difficulty seeing, even when wearing glasses/contacts?: No Does the patient have difficulty concentrating, remembering, or making decisions?: No  Permission Sought/Granted      Share Information with NAME: Graciano and [name]     Permission granted to share info w Relationship: Patient and spouse     Emotional Assessment Appearance:: Appears stated age   Affect (typically observed): Accepting, Calm Orientation: : Oriented to Self, Oriented to Place, Oriented to Situation, Oriented to  Time Alcohol / Substance Use: Not Applicable Psych Involvement: No (comment)  Admission diagnosis:  Proctocolitis [K52.9] Patient Active Problem List   Diagnosis Date Noted   Proctocolitis 02/01/2024   Lobar pneumonia (HCC) 02/01/2024   Vomiting 12/18/2023   Dysphagia 12/18/2023   Dehydration 12/17/2023   Electrolyte abnormality 12/16/2023   Gastritis and gastroduodenitis 10/19/2023   Duodenal adenoma 10/19/2023   GIB (gastrointestinal bleeding) 10/18/2023   Gastrointestinal hemorrhage with hematemesis  10/17/2023   Adenomatous duodenal polyp 08/24/2022   Coffee ground emesis 07/14/2022   MGUS (monoclonal gammopathy of unknown significance) 03/03/2021   Chronic obstructive pulmonary disease (HCC) 02/17/2021   Common variable immunodeficiency (HCC) 02/17/2021   Osteoarthrosis 02/17/2021    Diverticular disease 01/05/2021   History of cerebrovascular accident 01/05/2021   Hiatal hernia 01/05/2021   Hyperlipidemia 01/05/2021   Glaucoma 01/05/2021   Esotropia of left eye 01/05/2021   Benign prostatic hyperplasia 01/05/2021   History of radiation therapy 01/05/2021   Anemia 01/05/2021   GI bleed 03/18/2020   Acute lower GI bleeding 03/03/2020   Pressure injury of skin 03/03/2020   Thoracic aortic aneurysm without rupture (HCC) 11/19/2019   Hardening of the aorta (main artery of the heart) (HCC) 11/19/2019   Protein-calorie malnutrition (HCC) 06/10/2019   Avitaminosis D 03/07/2019   Male hypogonadism 07/26/2018   Gynecomastia 06/27/2018   Malignant neoplasm of upper lobe of left lung (HCC) 04/06/2018   History of malignant neoplasm of parotid gland 02/07/2018   Adenoma of large intestine 11/09/2016   Adult hypothyroidism 09/09/2015   Chronic constipation 05/22/2015   Impetigo bullosa 10/23/2013   Chronic left shoulder pain 09/25/2013   Muscle weakness (generalized) 09/25/2013   Hypercalcemia 08/08/2013   Chronic kidney disease, stage 3 unspecified (HCC) 08/07/2013   AKI (acute kidney injury) (HCC) 07/24/2013   Acute kidney failure, unspecified (HCC) 07/24/2013   Adhesive capsulitis of left shoulder 05/14/2013   GERD (gastroesophageal reflux disease)    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/23/2012   Epidermoid cyst 10/05/2012   Neck pain 08/26/2012   Chronic abdominal pain 08/26/2012   Edema of lower extremity 08/24/2012   Hemiplegia of dominant side as late effect of cerebrovascular disease (HCC) 06/06/2012   Diarrhea 04/17/2012   Alimentary obesity 04/17/2012   Type 2 diabetes mellitus with diabetic neuropathy, unspecified (HCC) 02/13/2012   Recurrent boils 01/12/2012   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) 07/15/2011   Hemorrhoids, internal 03/23/2011   Hepatitis 03/02/2011   Steatohepatitis 03/02/2011   Abdominal pain, right upper quadrant  12/16/2010   Abnormal levels of other serum enzymes 12/16/2010   Eosinophil count raised 12/23/2009   HEMATOCHEZIA 12/23/2009   Type 2 diabetes mellitus (HCC) 01/05/2009   Gout 01/05/2009   Hypertensive disorder 01/05/2009   BARRETTS ESOPHAGUS 01/05/2009   DIVERTICULOSIS OF COLON 01/05/2009   History of malignant lymphoma 01/05/2009   PCP:  System, Provider Not In Pharmacy:   [facility] - [place], [place] - [address] [place] [address] Phone: [phone] Fax: [phone]  [facility] - [place], [place] - [address] [place] [address] Phone: [phone] Fax: [phone]     Social Drivers of Health (SDOH) Social History: SDOH Screenings   Food Insecurity: No Food Insecurity (02/02/2024)  Housing: Low Risk  (02/02/2024)  Transportation Needs: No Transportation Needs (02/02/2024)  Utilities: Not At Risk (02/02/2024)  Alcohol Screen: Low Risk  (01/26/2021)  Depression (PHQ2-9): Low Risk  (09/21/2021)  Financial Resource Strain: Low Risk  (05/29/2020)  Social Connections: Unknown (02/02/2024)  Tobacco Use: Medium Risk (02/01/2024)   SDOH Interventions:     Readmission Risk Interventions    02/02/2024    1:58 PM 12/22/2023   11:19 AM 12/20/2023   12:46 PM  Readmission Risk Prevention Plan  Transportation Screening Complete Complete Complete  Medication Review Oceanographer) Complete  Complete Complete  HRI or Home Care Consult Complete Complete Complete  SW Recovery Care/Counseling Consult Complete Complete Complete  Palliative Care Screening Complete Complete Complete  Skilled Nursing Facility Complete Complete Complete

## 2024-02-03 DIAGNOSIS — K529 Noninfective gastroenteritis and colitis, unspecified: Secondary | ICD-10-CM | POA: Diagnosis not present

## 2024-02-03 LAB — COMPREHENSIVE METABOLIC PANEL
ALT: 8 U/L (ref 0–44)
AST: 10 U/L — ABNORMAL LOW (ref 15–41)
Albumin: 2.2 g/dL — ABNORMAL LOW (ref 3.5–5.0)
Alkaline Phosphatase: 88 U/L (ref 38–126)
Anion gap: 12 (ref 5–15)
BUN: 14 mg/dL (ref 8–23)
CO2: 23 mmol/L (ref 22–32)
Calcium: 8.6 mg/dL — ABNORMAL LOW (ref 8.9–10.3)
Chloride: 101 mmol/L (ref 98–111)
Creatinine, Ser: 1.3 mg/dL — ABNORMAL HIGH (ref 0.61–1.24)
GFR, Estimated: 57 mL/min — ABNORMAL LOW (ref >60)
Glucose, Bld: 101 mg/dL — ABNORMAL HIGH (ref 70–99)
Potassium: 2.4 mmol/L — CL (ref 3.5–5.1)
Sodium: 136 mmol/L (ref 135–145)
Total Bilirubin: 0.6 mg/dL (ref 0.0–1.2)
Total Protein: 5.6 g/dL — ABNORMAL LOW (ref 6.5–8.1)

## 2024-02-03 LAB — CBC WITH DIFFERENTIAL/PLATELET
Abs Immature Granulocytes: 0.11 10*3/uL — ABNORMAL HIGH (ref 0.00–0.07)
Basophils Absolute: 0 10*3/uL (ref 0.0–0.1)
Basophils Relative: 0 %
Eosinophils Absolute: 0.1 10*3/uL (ref 0.0–0.5)
Eosinophils Relative: 1 %
HCT: 33.6 % — ABNORMAL LOW (ref 39.0–52.0)
Hemoglobin: 10.8 g/dL — ABNORMAL LOW (ref 13.0–17.0)
Immature Granulocytes: 1 %
Lymphocytes Relative: 8 %
Lymphs Abs: 0.7 10*3/uL (ref 0.7–4.0)
MCH: 21.5 pg — ABNORMAL LOW (ref 26.0–34.0)
MCHC: 32.1 g/dL (ref 30.0–36.0)
MCV: 66.9 fL — ABNORMAL LOW (ref 80.0–100.0)
Monocytes Absolute: 0.5 10*3/uL (ref 0.1–1.0)
Monocytes Relative: 5 %
Neutro Abs: 7.9 10*3/uL — ABNORMAL HIGH (ref 1.7–7.7)
Neutrophils Relative %: 85 %
Platelets: 356 10*3/uL (ref 150–400)
RBC: 5.02 MIL/uL (ref 4.22–5.81)
RDW: 22.7 % — ABNORMAL HIGH (ref 11.5–15.5)
WBC: 9.3 10*3/uL (ref 4.0–10.5)
nRBC: 0 % (ref 0.0–0.2)

## 2024-02-03 LAB — GLUCOSE, CAPILLARY
Glucose-Capillary: 100 mg/dL — ABNORMAL HIGH (ref 70–99)
Glucose-Capillary: 178 mg/dL — ABNORMAL HIGH (ref 70–99)
Glucose-Capillary: 193 mg/dL — ABNORMAL HIGH (ref 70–99)

## 2024-02-03 MED ORDER — MAGNESIUM SULFATE 4 GM/100ML IV SOLN
4.0000 g | Freq: Once | INTRAVENOUS | Status: AC
  Administered 2024-02-03: 4 g via INTRAVENOUS
  Filled 2024-02-03: qty 100

## 2024-02-03 MED ORDER — IPRATROPIUM-ALBUTEROL 0.5-2.5 (3) MG/3ML IN SOLN
3.0000 mL | Freq: Three times a day (TID) | RESPIRATORY_TRACT | Status: DC
  Administered 2024-02-04 (×3): 3 mL via RESPIRATORY_TRACT
  Filled 2024-02-03 (×3): qty 3

## 2024-02-03 MED ORDER — TRAZODONE HCL 50 MG PO TABS
100.0000 mg | ORAL_TABLET | Freq: Every day | ORAL | Status: DC
  Administered 2024-02-03 – 2024-02-04 (×2): 100 mg via ORAL
  Filled 2024-02-03 (×2): qty 2

## 2024-02-03 MED ORDER — OXYCODONE HCL 5 MG PO TABS
10.0000 mg | ORAL_TABLET | Freq: Four times a day (QID) | ORAL | Status: DC
  Administered 2024-02-03 – 2024-02-05 (×10): 10 mg via ORAL
  Filled 2024-02-03 (×10): qty 2

## 2024-02-03 MED ORDER — ENSURE ENLIVE PO LIQD
237.0000 mL | Freq: Every day | ORAL | Status: DC
  Administered 2024-02-03 – 2024-02-05 (×3): 237 mL via ORAL

## 2024-02-03 MED ORDER — BUPROPION HCL ER (SR) 100 MG PO TB12
100.0000 mg | ORAL_TABLET | Freq: Two times a day (BID) | ORAL | Status: DC
  Administered 2024-02-03 – 2024-02-05 (×5): 100 mg via ORAL
  Filled 2024-02-03 (×5): qty 1

## 2024-02-03 MED ORDER — GABAPENTIN 300 MG PO CAPS
300.0000 mg | ORAL_CAPSULE | Freq: Two times a day (BID) | ORAL | Status: DC
  Administered 2024-02-03: 300 mg via ORAL
  Filled 2024-02-03: qty 1

## 2024-02-03 MED ORDER — NUTRITIONAL SUPPLEMENT PO LIQD: Freq: Every day | ORAL | Status: DC

## 2024-02-03 MED ORDER — ONDANSETRON HCL 4 MG PO TABS: 4.0000 mg | ORAL_TABLET | Freq: Three times a day (TID) | ORAL | Status: DC | PRN

## 2024-02-03 MED ORDER — POTASSIUM CHLORIDE 10 MEQ/100ML IV SOLN
10.0000 meq | INTRAVENOUS | Status: AC
  Administered 2024-02-03 (×6): 10 meq via INTRAVENOUS
  Filled 2024-02-03 (×6): qty 100

## 2024-02-03 MED ORDER — POLYETHYLENE GLYCOL 3350 17 G PO PACK
17.0000 g | PACK | Freq: Every day | ORAL | Status: DC
  Administered 2024-02-05: 17 g via ORAL
  Filled 2024-02-03 (×2): qty 1

## 2024-02-03 NOTE — Plan of Care (Signed)
  Problem: Acute Rehab PT Goals(only PT should resolve) Goal: Pt will Roll Supine to Side Outcome: Progressing Flowsheets (Taken 02/03/2024 0902) Pt will Roll Supine to Side: with mod assist Goal: Pt Will Go Supine/Side To Sit Outcome: Progressing Flowsheets (Taken 02/03/2024 0902) Pt will go Supine/Side to Sit: with moderate assist Goal: Patient Will Perform Sitting Balance Outcome: Progressing Flowsheets (Taken 02/03/2024 0902) Patient will perform sitting balance: with moderate assist Goal: Pt Will Transfer Bed To Chair/Chair To Bed Outcome: Progressing Flowsheets (Taken 02/03/2024 0902) Pt will Transfer Bed to Chair/Chair to Bed: with mod assist

## 2024-02-03 NOTE — Progress Notes (Signed)
 Telemetry called stating patient's hr was in the 160s. Patient states that they are having chest pain, but he has had chest pain for a long time. Currently completing an EKG. MD Dezii notified.

## 2024-02-03 NOTE — Progress Notes (Signed)
 [facility] Hospitalized [facility]ospice Patient Visit   Mr. Mclane Arora is a current hospice patient, with hospice diagnosis of Gastrointestinal hemorrhage, unspecified. He was admitted to [facility] 2.20.2025 with diagnosis of proctocolitis and symptoms including abdomen pain and vomiting. [facility] was notified that patient was being sent to the ED. Per Dr. Rosanne Sack, hospice physician, this is a related hospital admission.    Visited with patient in his hospital room. No family was present at the time. Patient reports he is still not feeling better and is still having abdominal pain. Spoke with wife by phone. She advised she was waiting for hospitalist to come visit. Reviewed goals of care.    Patient is GIP appropriate due to need for evaluation of abdominal pain and IV fluids and medications.    Vital Signs: 98.2/109/21  166/101   O2 100% on RA   I&O: 240/701   Abnormal labs:  Potassium: 2.4 (LL) Glucose: 101 (H) Creatinine: 1.30 (H) Calcium: 8.6 (L) Albumin: 2.2 (L) AST: 10 (L) Total Protein: 5.6 (L) GFR, Estimated: 57 (L) Hemoglobin: 10.8 (L) HCT: 33.6 (L) MCV: 66.9 (L)   RDW: 22.7 (H) NEUT#: 7.9 (H) Abs Immature Granulocytes: 0.11 (H) RBC Morphology: ANISOCYTOSIS Schistocytes: PRESENT Target Cells: PRESENT WBC Morphology: TOXIC GRANULATION  Diagnostics: None new  IV/PRN Meds: KCL IV x 2, MgSo4 4G IV x 1, Rocephin 2G IV x 1, Azithromycin 500mg  IV x 1, Zofran 4mg  IV x 1, Norco 5-325 mg tab PO x1, Hydralazine 10mg  IV x1   Problem List per Dr. Rennis Chris, Gordy Councilman, DO 2.21.25:  Assessment & Plan:  Principal Problem:   Proctocolitis Active Problems:   Electrolyte abnormality   Lobar pneumonia (HCC)   Type 2 diabetes mellitus (HCC)   Gout   Hemiplegia of dominant side as late effect of cerebrovascular disease (HCC)   Esotropia of left eye   Benign prostatic hyperplasia   Chronic kidney disease, stage 3 unspecified (HCC)   Chronic obstructive  pulmonary disease (HCC)   MGUS (monoclonal gammopathy of unknown significance)       Hospice patient - Currently resides in nursing home under [facility] hospice. - At this time the patient's wife is unsure if she like to proceed with hospice care.  The patient's hospice liaison has been reengaged and they are assisting with continued goals of care conversations - I discussed again with the patient and his wife today that his prognosis is poor in light of his chronic conditions and frequent hospitalizations.  They endorsed understanding.   RSV positive - Afebrile, no leukocytosis - Continue with supportive care   Colitis - Presents with vomiting, diarrhea, abdominal pain - C. difficile negative, GI pathogen panel negative - Status post 1.5 L IV fluids, hold off on further fluids given anasarca - Hold home Lasix currently - On ceftriaxone and Flagyl currently, will discontinue given no evidence of bacterial colitis - Suspect this is viral    Lobar pneumonia - Likely aspiration pneumonia, has baseline dysphagia.  Has had multiple admissions for aspiration pneumonia in the past. Also likely increased secretions given RSV - CT showing new right lower lobe pneumonia - On ceftriaxone and azithromycin added for pneumonia coverage, will continue - Continue mucolytics   Electrolyte abnormality Hypokalemia Hypomagnesemia - Likely secondary to diuretics - Replace as needed - Trend CMP   COPD - Complicated by pneumonia and RSV - Continue home meds - As needed DuoNebs   BPH - Continue Flomax   Hemiplegia of dominant side as late  effect of cerebrovascular disease - Resides in a nursing home   Right brachial vein DVT -Will initiate Eliquis at loading dose   Anasarca - secondary to hypoalbuminemia - Resume home dose Lasix    Gout - Resume allopurinol   Type 2 diabetes - Hemoglobin A1c 6.7 - Sliding scale insulin as needed   CKD stage IIIb - Cr at baseline    Small cell lung cancer status post radiation therapy MGUS with possible conversion to multiple myeloma - Follows outpatient oncology, not felt to be a candidate for additional treatments   C. difficile positive -Antigen positive, toxin negative - Continue enteric precautions for now  Discharge Planning: Ongoing   Family Contact: spoke with spouse by phone   IDT: Updated   Goals of Care: DNR   Henderson Newcomer, LPN Hospice hospital liaison [phone]

## 2024-02-03 NOTE — Progress Notes (Signed)
 PROGRESS NOTE    [name]  UEA:[id number] DOB: 1946-03-29 DOA: 02/01/2024 PCP: System, Provider Not In  Chief Complaint  Patient presents with   Abdominal Pain    Hospital Course:  [name] is 78 y.o. male with history of CVA, residual right-sided hemiplegia and dysphagia, COPD, gout, hypertension, MGUS, diabetes, hypothyroidism, CKD stage IIIb, BPH, who currently resides in a nursing home under hospice care.  He presents on this admission with vomiting, diarrhea, abdominal pain, and right upper extremity swelling.  In the ED he was found to be hypertensive, hypokalemic.  CT abdomen pelvis revealed rectosigmoid colon thickening suggestive of proctocolitis and a new right lower lobe pneumonia with small bilateral pleural effusions.  Patient was also found to be positive for RSV. He was Started on antibiotics and admitted for treatment  Subjective: This morning patient continues to complain of chest pain and arm pain.  He is having intermittent tachycardia up to the 160s. He had extensive discussion regarding his wishes and he is clear that he would like to be hospice, comfort measures only.  He is aware that his current medical conditions are terminal if left untreated.  He reports " I am ready to go to heaven.  Please leave me alone and let me go"  Objective: Vitals:   02/03/24 0210 02/03/24 0533 02/03/24 0810 02/03/24 0830  BP: (!) 140/101 (!) 161/118  (!) 166/101  Pulse: (!) 106 (!) 109    Resp: 19 (!) 21    Temp: 98.2 F (36.8 C) 98.2 F (36.8 C)    TempSrc: Oral Oral    SpO2: 98% 100% 100%   Weight:      Height:        Intake/Output Summary (Last 24 hours) at 02/03/2024 0848 Last data filed at 02/03/2024 0600 Gross per 24 hour  Intake 240 ml  Output 701 ml  Net -461 ml   Filed Weights   02/01/24 1608 02/02/24 1401  Weight: 101.4 kg 97.8 kg    Examination: General exam: Appears calm and comfortable, weak appearing, diffusely anasarcic Respiratory system: No  work of breathing, symmetric chest wall expansion, respirations Cardiovascular system: S1 & S2 heard, RRR.  Gastrointestinal system: Abdomen is nondistended, soft and nontender.  Neuro: Alert and oriented.  Speech is slurred and at times unintelligible, does require prompting Extremities: significant right arm swelling.  Skin: No rashes, lesions Psychiatry:  Mood & affect appropriate for situation.   Assessment & Plan:  Principal Problem:   Proctocolitis Active Problems:   Electrolyte abnormality   Lobar pneumonia (HCC)   Type 2 diabetes mellitus (HCC)   Gout   Hemiplegia of dominant side as late effect of cerebrovascular disease (HCC)   Esotropia of left eye   Benign prostatic hyperplasia   Chronic kidney disease, stage 3 unspecified (HCC)   Chronic obstructive pulmonary disease (HCC)   MGUS (monoclonal gammopathy of unknown significance)    Hospice patient Comfort Measures Only - Currently resides in nursing home under [facility] hospice. -After extensive discussion today with the patient's wife and son at bedside, they are amendable to resuming comfort measures only and discharging with hospice care.  They do not currently believe the care he is receiving at his facility is sufficient and would like to explore alternative hospice care facilities. Will discuss with TOC. - At this time we will discontinue all routine lab draws, stop all medications that do not immediately provide the patient comfort, perform vitals once per shift, turn off all  alarms. - Patient's hospice liaison's continues to follow  RSV positive - Afebrile, no leukocytosis - Continue with supportive care  Colitis - Presents with vomiting, diarrhea, abdominal pain - C. difficile negative, GI pathogen panel negative - Status post 1.5 L IV fluids, hold off on further fluids given anasarca - Hold home Lasix currently - On ceftriaxone and Flagyl currently, will discontinue given no evidence of  bacterial colitis - Suspect this is viral   Lobar pneumonia - Likely aspiration pneumonia, has baseline dysphagia.  Has had multiple admissions for aspiration pneumonia in the past. Also likely increased secretions given RSV - CT showing new right lower lobe pneumonia - On ceftriaxone and azithromycin added for pneumonia coverage, will  discontinue this now given aspiration is likely to be recurrent and pt is now CMO - Continue mucolytics  Electrolyte abnormality Hypokalemia Hypomagnesemia - Likely secondary to diuretics - Replace as needed - Trend CMP  COPD - Complicated by pneumonia and RSV - Continue home meds - As needed DuoNebs  BPH - Continue Flomax  Hemiplegia of dominant side as late effect of cerebrovascular disease - Resides in a nursing home  Right brachial vein DVT -Will initiate Eliquis at loading dose  Anasarca - secondary to hypoalbuminemia  Gout - Resume allopurinol  Type 2 diabetes - Hemoglobin A1c 6.7 - Sliding scale insulin as needed  CKD stage IIIb - Cr at baseline  Small cell lung cancer status post radiation therapy MGUS with possible conversion to multiple myeloma - Follows outpatient oncology, not felt to be a candidate for additional treatments  C. difficile positive -Antigen positive, toxin negative - Discontinue enteric precautions for now  DVT prophylaxis: On full dose Eliquis for DVT as above   Code Status: Limited: Do not attempt resuscitation (DNR) -DNR-LIMITED -Do Not Intubate/DNI  Family Communication:  Discussed directly with patient, his son, and his wife at bedside. Disposition:  Inpatient still hospitalized for ongoing management, will discharge to NH possibly with hospice pending further discussions with family.   Consultants:    Procedures:    Antimicrobials:  Anti-infectives (From admission, onward)    Start     Dose/Rate Route Frequency Ordered Stop   02/02/24 2200  cefTRIAXone (ROCEPHIN) 2 g in sodium chloride  0.9 % 100 mL IVPB        2 g 200 mL/hr over 30 Minutes Intravenous Every 24 hours 02/02/24 0219     02/02/24 1000  metroNIDAZOLE (FLAGYL) IVPB 500 mg  Status:  Discontinued        500 mg 100 mL/hr over 60 Minutes Intravenous Every 12 hours 02/02/24 0219 02/02/24 1635   02/02/24 0230  azithromycin (ZITHROMAX) 500 mg in sodium chloride 0.9 % 250 mL IVPB        500 mg 250 mL/hr over 60 Minutes Intravenous Daily at bedtime 02/02/24 0219     02/01/24 2030  metroNIDAZOLE (FLAGYL) IVPB 500 mg        500 mg 100 mL/hr over 60 Minutes Intravenous  Once 02/01/24 2023 02/01/24 2234   02/01/24 1900  cefTRIAXone (ROCEPHIN) 2 g in sodium chloride 0.9 % 100 mL IVPB        2 g 200 mL/hr over 30 Minutes Intravenous  Once 02/01/24 1851 02/01/24 2127       Data Reviewed: I have personally reviewed following labs and imaging studies CBC: Recent Labs  Lab 02/01/24 1647 02/02/24 0634  WBC 9.9 9.4  NEUTROABS 8.2*  --   HGB 9.4* 9.4*  HCT 30.0* 30.1*  MCV 68.6* 68.3*  PLT 307 309   Basic Metabolic Panel: Recent Labs  Lab 02/01/24 1647 02/02/24 0634  NA 134* 135  K 2.6* 2.9*  CL 97* 102  CO2 28 26  GLUCOSE 209* 142*  BUN 19 17  CREATININE 1.59* 1.43*  CALCIUM 7.6* 7.6*  MG 1.0* 1.2*   GFR: Estimated Creatinine Clearance: 52.4 mL/min (A) (by C-G formula based on SCr of 1.43 mg/dL (H)). Liver Function Tests: Recent Labs  Lab 02/01/24 1647  AST 10*  ALT 9  ALKPHOS 82  BILITOT 0.4  PROT 5.1*  ALBUMIN 2.0*   CBG: Recent Labs  Lab 02/02/24 0804 02/02/24 1113 02/02/24 1646 02/02/24 2102 02/03/24 0723  GLUCAP 119* 151* 139* 149* 100*    Recent Results (from the past 240 hours)  Resp panel by RT-PCR (RSV, Flu A&B, Covid) Anterior Nasal Swab     Status: Abnormal   Collection Time: 02/01/24  7:44 PM   Specimen: Anterior Nasal Swab  Result Value Ref Range Status   SARS Coronavirus 2 by RT PCR NEGATIVE NEGATIVE Final    Comment: (NOTE) SARS-CoV-2 target nucleic acids are NOT  DETECTED.  The SARS-CoV-2 RNA is generally detectable in upper respiratory specimens during the acute phase of infection. The lowest concentration of SARS-CoV-2 viral copies this assay can detect is 138 copies/mL. A negative result does not preclude SARS-Cov-2 infection and should not be used as the sole basis for treatment or other patient management decisions. A negative result may occur with  improper specimen collection/handling, submission of specimen other than nasopharyngeal swab, presence of viral mutation(s) within the areas targeted by this assay, and inadequate number of viral copies(<138 copies/mL). A negative result must be combined with clinical observations, patient history, and epidemiological information. The expected result is Negative.  Fact Sheet for Patients:  BloggerCourse.com  Fact Sheet for Healthcare Providers:  SeriousBroker.it  This test is no t yet approved or cleared by the [place] FDA and  has been authorized for detection and/or diagnosis of SARS-CoV-2 by FDA under an Emergency Use Authorization (EUA). This EUA will remain  in effect (meaning this test can be used) for the duration of the COVID-19 declaration under Section 564(b)(1) of the Act, 21 U.S.C.section 360bbb-3(b)(1), unless the authorization is terminated  or revoked sooner.       Influenza A by PCR NEGATIVE NEGATIVE Final   Influenza B by PCR NEGATIVE NEGATIVE Final    Comment: (NOTE) The Xpert Xpress SARS-CoV-2/FLU/RSV plus assay is intended as an aid in the diagnosis of influenza from Nasopharyngeal swab specimens and should not be used as a sole basis for treatment. Nasal washings and aspirates are unacceptable for Xpert Xpress SARS-CoV-2/FLU/RSV testing.  Fact Sheet for Patients: BloggerCourse.com  Fact Sheet for Healthcare Providers: SeriousBroker.it  This test is not yet  approved or cleared by the [place] FDA and has been authorized for detection and/or diagnosis of SARS-CoV-2 by FDA under an Emergency Use Authorization (EUA). This EUA will remain in effect (meaning this test can be used) for the duration of the COVID-19 declaration under Section 564(b)(1) of the Act, 21 U.S.C. section 360bbb-3(b)(1), unless the authorization is terminated or revoked.     Resp Syncytial Virus by PCR POSITIVE (A) NEGATIVE Final    Comment: (NOTE) Fact Sheet for Patients: BloggerCourse.com  Fact Sheet for Healthcare Providers: SeriousBroker.it  This test is not yet approved or cleared by the [place] FDA and has been authorized for detection and/or diagnosis of SARS-CoV-2 by  FDA under an Emergency Use Authorization (EUA). This EUA will remain in effect (meaning this test can be used) for the duration of the COVID-19 declaration under Section 564(b)(1) of the Act, 21 U.S.C. section 360bbb-3(b)(1), unless the authorization is terminated or revoked.  Performed at [facility], [address], [place], [place] [address]   Gastrointestinal Panel by PCR , Stool     Status: None   Collection Time: 02/02/24  3:27 AM   Specimen: Stool  Result Value Ref Range Status   Campylobacter species NOT DETECTED NOT DETECTED Final   Plesimonas shigelloides NOT DETECTED NOT DETECTED Final   Salmonella species NOT DETECTED NOT DETECTED Final   Yersinia enterocolitica NOT DETECTED NOT DETECTED Final   Vibrio species NOT DETECTED NOT DETECTED Final   Vibrio cholerae NOT DETECTED NOT DETECTED Final   Enteroaggregative E coli (EAEC) NOT DETECTED NOT DETECTED Final   Enteropathogenic E coli (EPEC) NOT DETECTED NOT DETECTED Final   Enterotoxigenic E coli (ETEC) NOT DETECTED NOT DETECTED Final   Shiga like toxin producing E coli (STEC) NOT DETECTED NOT DETECTED Final   Shigella/Enteroinvasive E coli (EIEC) NOT DETECTED NOT DETECTED  Final   Cryptosporidium NOT DETECTED NOT DETECTED Final   Cyclospora cayetanensis NOT DETECTED NOT DETECTED Final   Entamoeba histolytica NOT DETECTED NOT DETECTED Final   Giardia lamblia NOT DETECTED NOT DETECTED Final   Adenovirus F40/41 NOT DETECTED NOT DETECTED Final   Astrovirus NOT DETECTED NOT DETECTED Final   Norovirus GI/GII NOT DETECTED NOT DETECTED Final   Rotavirus A NOT DETECTED NOT DETECTED Final   Sapovirus (I, II, IV, and V) NOT DETECTED NOT DETECTED Final    Comment: Performed at [facility], [address] [place], [place] [address]  C Difficile Quick Screen w PCR reflex     Status: Abnormal   Collection Time: 02/02/24  3:27 AM   Specimen: Stool  Result Value Ref Range Status   C Diff antigen POSITIVE (A) NEGATIVE Final   C Diff toxin NEGATIVE NEGATIVE Final   C Diff interpretation Results are indeterminate. See PCR results.  Final    Comment: Performed at [facility], [address] [place], [place] [address]  C. Diff by PCR, Reflexed     Status: None   Collection Time: 02/02/24  3:27 AM  Result Value Ref Range Status   Toxigenic C. Difficile by PCR NEGATIVE NEGATIVE Final    Comment: Patient is colonized with non toxigenic C. difficile. May not need treatment unless significant symptoms are present. Performed at [facility], [address] [place], [place] [address]      Radiology Studies: US Venous Img Upper Uni Right(DVT) Result Date: 02/02/2024 CLINICAL DATA:  Right upper extremity swelling. EXAM: Right UPPER EXTREMITY VENOUS DOPPLER ULTRASOUND TECHNIQUE: Gray-scale sonography with graded compression, as well as color Doppler and duplex ultrasound were performed to evaluate the upper extremity deep venous system from the level of the subclavian vein and including the jugular, axillary, basilic, radial, ulnar and upper cephalic vein. Spectral Doppler was utilized to evaluate flow at rest and with distal augmentation maneuvers. Exam is somewhat  limited as patient arm was contracted. COMPARISON:  None Available. FINDINGS: Contralateral Subclavian Vein: Respiratory phasicity is normal and symmetric with the symptomatic side. No evidence of thrombus. Normal compressibility. Internal Jugular Vein: No evidence of thrombus. Normal compressibility, respiratory phasicity and response to augmentation. Subclavian Vein: No evidence of thrombus. Normal compressibility, respiratory phasicity and response to augmentation. Axillary Vein: No evidence  of thrombus. Normal compressibility, respiratory phasicity and response to augmentation. Cephalic Vein: Not visualized. Basilic Vein: No evidence of thrombus. Normal compressibility, respiratory phasicity and response to augmentation. Brachial Veins: Occlusive thrombus is noted in 1 brachial vein without flow or compressibility. The other brachial vein is not visualized. Radial Veins: No evidence of thrombus. Normal compressibility, respiratory phasicity and response to augmentation. Ulnar Veins: Not visualized. Venous Reflux:  None visualized. Other Findings:  None visualized. IMPRESSION: Occlusive deep venous thrombosis is noted in 1 right brachial vein. Electronically Signed   By: Lupita Raider M.D.   On: 02/02/2024 12:50   DG Chest Port 1 View Result Date: 02/01/2024 CLINICAL DATA:  Shortness of breath with chest and abdominal pain. EXAM: PORTABLE CHEST 1 VIEW COMPARISON:  12/16/2023. FINDINGS: The heart size and mediastinal contours are stable stable bandlike opacity is noted in the left upper lobe. There are surgical changes in the mid right lung. No acute infiltrate, effusion or pneumothorax is seen. No acute osseous abnormality. IMPRESSION: 1. No acute abnormality. 2. Stable bandlike opacity in the left upper lobe. Electronically Signed   By: Thornell Sartorius M.D.   On: 02/01/2024 19:30   CT ABDOMEN PELVIS W CONTRAST Result Date: 02/01/2024 CLINICAL DATA:  Abdominal pain. EXAM: CT ABDOMEN AND PELVIS WITH CONTRAST  TECHNIQUE: Multidetector CT imaging of the abdomen and pelvis was performed using the standard protocol following bolus administration of intravenous contrast. RADIATION DOSE REDUCTION: This exam was performed according to the departmental dose-optimization program which includes automated exposure control, adjustment of the mA and/or kV according to patient size and/or use of iterative reconstruction technique. CONTRAST:  80mL OMNIPAQUE IOHEXOL 300 MG/ML  SOLN COMPARISON:  CT abdomen pelvis dated December 16, 2023. FINDINGS: Lower chest: New small bilateral pleural effusions. New peribronchial thickening and patchy consolidation in the posterior right lower lobe. Hepatobiliary: No focal liver abnormality is seen. Status post cholecystectomy. No biliary dilatation. Pancreas: Unremarkable. No pancreatic ductal dilatation or surrounding inflammatory changes. Spleen: 1.9 cm round hypodense lesion in the medial spleen, previously 1.8 cm in August 2023, indeterminate but benign given stability. Normal in size. Adrenals/Urinary Tract: Bilateral adrenal gland thickening again noted. Multiple bilateral renal simple cysts again noted. No follow-up imaging is recommended. No renal calculi or hydronephrosis. The bladder is unremarkable. Stomach/Bowel: Distended stomach. Unremarkable small bowel. Wall thickening of the rectosigmoid colon. Diffuse colonic diverticulosis. Normal appendix. Vascular/Lymphatic: Aortic atherosclerosis. No enlarged abdominal or pelvic lymph nodes. Reproductive: Unchanged mild prostatomegaly. Other: No free fluid or pneumoperitoneum. Musculoskeletal: No acute or significant osseous findings. IMPRESSION: 1. Wall thickening of the rectosigmoid colon, suggestive of proctocolitis. 2. New right lower lobe pneumonia with small bilateral pleural effusions. 3.  Aortic Atherosclerosis (ICD10-I70.0). Electronically Signed   By: Obie Dredge M.D.   On: 02/01/2024 18:26    Scheduled Meds:  allopurinol  150 mg  Oral Daily   amLODipine  10 mg Oral Daily   apixaban  10 mg Oral BID   Followed by   Melene Muller ON 02/09/2024] apixaban  5 mg Oral BID   furosemide  40 mg Oral Daily   insulin aspart  0-5 Units Subcutaneous QHS   insulin aspart  0-9 Units Subcutaneous TID WC   ipratropium-albuterol  3 mL Nebulization Q6H   metoprolol tartrate  50 mg Oral Daily   pantoprazole  40 mg Oral BID   potassium chloride  40 mEq Oral BID   tamsulosin  0.4 mg Oral Daily   Continuous Infusions:  azithromycin 500 mg (02/02/24  2317)   cefTRIAXone (ROCEPHIN)  IV 2 g (02/02/24 2222)     LOS: 2 days    Total time spent coordinating care:   Debarah Crape, DO [facility]  To contact the attending physician between 7A-7P please use Epic Chat. To contact the covering physician during after hours 7P-7A, please review Amion.   02/03/2024, 8:48 AM   *This document has been created with the assistance of dictation software. Please excuse typographical errors. *

## 2024-02-03 NOTE — Progress Notes (Signed)
 This Investment banker, operational was called to assess IV access & to attempt a second line. Upon chart review last hospitalization patient had femoral line in place due to similar issues. This RN was unable to place a second site at this time. Bedside RN notified & attending MD.

## 2024-02-03 NOTE — Evaluation (Signed)
 Physical Therapy Evaluation Patient Details Name: [name] MRN: [id number] DOB: 18-Apr-1946 Today's Date: 02/03/2024  History of Present Illness  [name] is a 78 y.o. male who is a hospice patient with medical history significant for CVA with residual dense right hemiplegia, COPD, gout, hypertension, MGUS, diabetes mellitus, hypothyroidism, CKD 3B.  Patient was brought to the ED from [facility] reports of vomiting, diarrhea, abdominal pain that started 3 days ago.  At baseline, patient's speech is limited due to stroke, history is obtained per chart review, and spouse at bedside.  Patient reports multiple episodes of vomiting since onset.  Reports productive cough, no difficulty breathing.  Reports chronic problems with esophagus and swallowing causing pneumonias.  He also reports swelling to his right upper extremity, he is unsure of how long, but noted it over the past month.  He also has bilateral lower extremity swelling-also on the right.    Clinical Impression  Patient is sleeping on therapist arrival but rouses easily with verbal greeting.  Patient reports 10/10 pain in his stomach and "all over".  Patient with some difficulty with expressing his thoughts and answering questions due to old CVA.  Noted significant swelling right arm.  Patient needs max assist to roll to the left and reports increased pain with movement.  Did not attempt transfer due to high pain level and need for max A with bed mobility.  When asked about returning to [facility] he states he would like to try another facility.  His wife is not at bedside this morning.  patient left in bed with call button in reach, bed alarm set and nursing present.  Patient will benefit from continued skilled therapy services during the remainder of his hospital stay and at the next recommended venue of care to address deficits and promote return to optimal function.            If plan is discharge home, recommend the  following: A lot of help with walking and/or transfers;A lot of help with bathing/dressing/bathroom   Can travel by private vehicle   No    Equipment Recommendations None recommended by PT  Recommendations for Other Services       Functional Status Assessment Patient has had a recent decline in their functional status and demonstrates the ability to make significant improvements in function in a reasonable and predictable amount of time.     Precautions / Restrictions Precautions Precautions: None Restrictions Weight Bearing Restrictions Per Provider Order: No      Mobility  Bed Mobility Overal bed mobility: Needs Assistance Bed Mobility: Rolling Rolling: Max assist         General bed mobility comments: max assist for partial rolling to left    Transfers                   General transfer comment: did not attempt transfer due to high pain levels and patient inability to assist; max A for rolling in bed    Ambulation/Gait               General Gait Details: non ambulatory  Stairs            Wheelchair Mobility     Tilt Bed    Modified Rankin (Stroke Patients Only)       Balance  Pertinent Vitals/Pain Pain Assessment Pain Assessment: 0-10 Pain Score: 10-Worst pain ever Pain Location: stomach and "all over" Pain Intervention(s): Monitored during session, Repositioned, Limited activity within patient's tolerance    Home Living Family/patient expects to be discharged to:: Skilled nursing facility                   Additional Comments: states he is not able to return home    Prior Function Prior Level of Function : Needs assist       Physical Assist : Mobility (physical);ADLs (physical) Mobility (physical): Bed mobility;Transfers ADLs (physical): Feeding;Grooming;Bathing;Dressing;IADLs;Toileting Mobility Comments: Pt reports that he uses a w/c for  mobility ADLs Comments: currently needs assist with all activity     Extremity/Trunk Assessment   Upper Extremity Assessment Upper Extremity Assessment: Right hand dominant;RUE deficits/detail RUE Deficits / Details: swollen right arm; no functional use    Lower Extremity Assessment Lower Extremity Assessment: Generalized weakness       Communication   Communication Communication: Impaired Factors Affecting Communication: Reduced clarity of speech;Difficulty expressing self    Cognition Arousal: Alert Behavior During Therapy: WFL for tasks assessed/performed   PT - Cognitive impairments: No family/caregiver present to determine baseline                         Following commands: Intact       Cueing       General Comments General comments (skin integrity, edema, etc.): did not transfer in order to assess balance    Exercises     Assessment/Plan    PT Assessment Patient needs continued PT services  PT Problem List         PT Treatment Interventions Functional mobility training;Therapeutic activities;Therapeutic exercise;Patient/family education;Wheelchair mobility training;Balance training    PT Goals (Current goals can be found in the Care Plan section)  Acute Rehab PT Goals Patient Stated Goal: return to SNF; patient states he would prefer to go to another facility PT Goal Formulation: With patient Time For Goal Achievement: 02/17/24 Potential to Achieve Goals: Good    Frequency Min 2X/week     Co-evaluation               AM-PAC PT "6 Clicks" Mobility  Outcome Measure Help needed turning from your back to your side while in a flat bed without using bedrails?: A Lot Help needed moving from lying on your back to sitting on the side of a flat bed without using bedrails?: A Lot Help needed moving to and from a bed to a chair (including a wheelchair)?: A Lot Help needed standing up from a chair using your arms (e.g., wheelchair or bedside  chair)?: Total Help needed to walk in hospital room?: Total Help needed climbing 3-5 steps with a railing? : Total 6 Click Score: 9    End of Session   Activity Tolerance: Patient limited by pain Patient left: in bed;with call bell/phone within reach;with nursing/sitter in room Nurse Communication: Mobility status PT Visit Diagnosis: Pain;Muscle weakness (generalized) (M62.81)    Time: 1610-9604 PT Time Calculation (min) (ACUTE ONLY): 20 min   Charges:   PT Evaluation $PT Eval Moderate Complexity: 1 Mod   PT General Charges $$ ACUTE PT VISIT: 1 Visit         9:01 AM, 02/03/24 Zaya Kessenich Small Iysha Mishkin MPT [facility] [place] [phone] Ph:[phone]

## 2024-02-04 DIAGNOSIS — K529 Noninfective gastroenteritis and colitis, unspecified: Secondary | ICD-10-CM | POA: Diagnosis not present

## 2024-02-04 NOTE — Progress Notes (Signed)
 PROGRESS NOTE    [name]  EXB:[id number] DOB: 11-Jan-1946 DOA: 02/01/2024 PCP: System, Provider Not In  Chief Complaint  Patient presents with   Abdominal Pain    Hospital Course:  [name] is 78 y.o. male with history of CVA, residual right-sided hemiplegia and dysphagia, COPD, gout, hypertension, MGUS, diabetes, hypothyroidism, CKD stage IIIb, BPH, who currently resides in a nursing home under hospice care.  He presents on this admission with vomiting, diarrhea, abdominal pain, and right upper extremity swelling.  In the ED he was found to be hypertensive, hypokalemic.  CT abdomen pelvis revealed rectosigmoid colon thickening suggestive of proctocolitis and a new right lower lobe pneumonia with small bilateral pleural effusions.  Patient was also found to be positive for RSV. He was Started on antibiotics and admitted for treatment  Subjective: No acute events overnight. On evaluation today patient complains of some back pain.  Objective: Vitals:   02/04/24 0732 02/04/24 0833 02/04/24 1341 02/04/24 1409  BP:  (!) 158/89  (!) 131/95  Pulse:    (!) 101  Resp:    18  Temp:    98.2 F (36.8 C)  TempSrc:    Oral  SpO2: 100%  99% 100%  Weight:      Height:        Intake/Output Summary (Last 24 hours) at 02/04/2024 1459 Last data filed at 02/04/2024 1411 Gross per 24 hour  Intake --  Output 650 ml  Net -650 ml   Filed Weights   02/01/24 1608 02/02/24 1401  Weight: 101.4 kg 97.8 kg    Examination: General exam: Appears calm and comfortable, weak appearing, diffusely anasarcic Respiratory system: No work of breathing, symmetric chest wall expansion, respirations Cardiovascular system: S1 & S2 heard, RRR.  Gastrointestinal system: Abdomen is nondistended, soft and nontender.  Neuro: Alert and oriented.  Speech is slurred and at times unintelligible, does require prompting Extremities: significant right arm swelling.  Skin: No rashes, lesions Psychiatry:  Mood &  affect appropriate for situation.   Assessment & Plan:  Principal Problem:   Proctocolitis Active Problems:   Electrolyte abnormality   Lobar pneumonia (HCC)   Type 2 diabetes mellitus (HCC)   Gout   Hemiplegia of dominant side as late effect of cerebrovascular disease (HCC)   Esotropia of left eye   Benign prostatic hyperplasia   Chronic kidney disease, stage 3 unspecified (HCC)   Chronic obstructive pulmonary disease (HCC)   MGUS (monoclonal gammopathy of unknown significance)    Hospice patient Comfort Measures Only - Currently resides in nursing home under [facility] hospice. -After extensive discussion today with the patient's wife and son at bedside, they are amendable to resuming comfort measures only and discharging with hospice care.  They do not currently believe the care he is receiving at his facility is sufficient and would like to explore alternative hospice care facilities. Will discuss with TOC. - At this time we will discontinue all routine lab draws, stop all medications that do not immediately provide the patient comfort, perform vitals once per shift, turn off all alarms. - Patient's hospice liaison's continues to follow  RSV positive - Afebrile, no leukocytosis - Continue with supportive care  Colitis - Presents with vomiting, diarrhea, abdominal pain - C. difficile negative, GI pathogen panel negative - Status post 1.5 L IV fluids, hold off on further fluids given anasarca - Hold home Lasix currently - On ceftriaxone and Flagyl currently, will discontinue given no evidence of bacterial colitis -  Suspect this is viral   Lobar pneumonia - Likely aspiration pneumonia, has baseline dysphagia.  Has had multiple admissions for aspiration pneumonia in the past. Also likely increased secretions given RSV - CT showing new right lower lobe pneumonia - On ceftriaxone and azithromycin added for pneumonia coverage, will  discontinue this now given  aspiration is likely to be recurrent and pt is now CMO - Continue mucolytics  Electrolyte abnormality Hypokalemia Hypomagnesemia - Likely secondary to diuretics - Replace as needed - Trend CMP  COPD - Complicated by pneumonia and RSV - Continue home meds - As needed DuoNebs  BPH - Continue Flomax  Hemiplegia of dominant side as late effect of cerebrovascular disease - Resides in a nursing home  Right brachial vein DVT -Will initiate Eliquis at loading dose  Anasarca - secondary to hypoalbuminemia  Gout - Resume allopurinol  Type 2 diabetes - Hemoglobin A1c 6.7 - Sliding scale insulin as needed  CKD stage IIIb - Cr at baseline  Small cell lung cancer status post radiation therapy MGUS with possible conversion to multiple myeloma - Follows outpatient oncology, not felt to be a candidate for additional treatments  C. difficile positive -Antigen positive, toxin negative - Discontinue enteric precautions for now  DVT prophylaxis: On full dose Eliquis for DVT as above   Code Status: Do not attempt resuscitation (DNR) - Comfort care Family Communication:  Discussed directly with patient, no family at bedside today. Disposition: CMO now, pending hospice arrangements outpatient  Consultants:    Procedures:    Antimicrobials:  Anti-infectives (From admission, onward)    Start     Dose/Rate Route Frequency Ordered Stop   02/02/24 2200  cefTRIAXone (ROCEPHIN) 2 g in sodium chloride 0.9 % 100 mL IVPB  Status:  Discontinued        2 g 200 mL/hr over 30 Minutes Intravenous Every 24 hours 02/02/24 0219 02/03/24 1358   02/02/24 1000  metroNIDAZOLE (FLAGYL) IVPB 500 mg  Status:  Discontinued        500 mg 100 mL/hr over 60 Minutes Intravenous Every 12 hours 02/02/24 0219 02/02/24 1635   02/02/24 0230  azithromycin (ZITHROMAX) 500 mg in sodium chloride 0.9 % 250 mL IVPB  Status:  Discontinued        500 mg 250 mL/hr over 60 Minutes Intravenous Daily at bedtime  02/02/24 0219 02/03/24 1358   02/01/24 2030  metroNIDAZOLE (FLAGYL) IVPB 500 mg        500 mg 100 mL/hr over 60 Minutes Intravenous  Once 02/01/24 2023 02/01/24 2234   02/01/24 1900  cefTRIAXone (ROCEPHIN) 2 g in sodium chloride 0.9 % 100 mL IVPB        2 g 200 mL/hr over 30 Minutes Intravenous  Once 02/01/24 1851 02/01/24 2127       Data Reviewed: I have personally reviewed following labs and imaging studies CBC: Recent Labs  Lab 02/01/24 1647 02/02/24 0634 02/03/24 0919  WBC 9.9 9.4 9.3  NEUTROABS 8.2*  --  7.9*  HGB 9.4* 9.4* 10.8*  HCT 30.0* 30.1* 33.6*  MCV 68.6* 68.3* 66.9*  PLT 307 309 356   Basic Metabolic Panel: Recent Labs  Lab 02/01/24 1647 02/02/24 0634 02/03/24 0919  NA 134* 135 136  K 2.6* 2.9* 2.4*  CL 97* 102 101  CO2 28 26 23   GLUCOSE 209* 142* 101*  BUN 19 17 14   CREATININE 1.59* 1.43* 1.30*  CALCIUM 7.6* 7.6* 8.6*  MG 1.0* 1.2*  --    GFR: Estimated  Creatinine Clearance: 57.7 mL/min (A) (by C-G formula based on SCr of 1.3 mg/dL (H)). Liver Function Tests: Recent Labs  Lab 02/01/24 1647 02/03/24 0919  AST 10* 10*  ALT 9 8  ALKPHOS 82 88  BILITOT 0.4 0.6  PROT 5.1* 5.6*  ALBUMIN 2.0* 2.2*   CBG: Recent Labs  Lab 02/02/24 1646 02/02/24 2102 02/03/24 0723 02/03/24 1219 02/03/24 1645  GLUCAP 139* 149* 100* 178* 193*    Recent Results (from the past 240 hours)  Resp panel by RT-PCR (RSV, Flu A&B, Covid) Anterior Nasal Swab     Status: Abnormal   Collection Time: 02/01/24  7:44 PM   Specimen: Anterior Nasal Swab  Result Value Ref Range Status   SARS Coronavirus 2 by RT PCR NEGATIVE NEGATIVE Final    Comment: (NOTE) SARS-CoV-2 target nucleic acids are NOT DETECTED.  The SARS-CoV-2 RNA is generally detectable in upper respiratory specimens during the acute phase of infection. The lowest concentration of SARS-CoV-2 viral copies this assay can detect is 138 copies/mL. A negative result does not preclude SARS-Cov-2 infection and  should not be used as the sole basis for treatment or other patient management decisions. A negative result may occur with  improper specimen collection/handling, submission of specimen other than nasopharyngeal swab, presence of viral mutation(s) within the areas targeted by this assay, and inadequate number of viral copies(<138 copies/mL). A negative result must be combined with clinical observations, patient history, and epidemiological information. The expected result is Negative.  Fact Sheet for Patients:  BloggerCourse.com  Fact Sheet for Healthcare Providers:  SeriousBroker.it  This test is no t yet approved or cleared by the [place] FDA and  has been authorized for detection and/or diagnosis of SARS-CoV-2 by FDA under an Emergency Use Authorization (EUA). This EUA will remain  in effect (meaning this test can be used) for the duration of the COVID-19 declaration under Section 564(b)(1) of the Act, 21 U.S.C.section 360bbb-3(b)(1), unless the authorization is terminated  or revoked sooner.       Influenza A by PCR NEGATIVE NEGATIVE Final   Influenza B by PCR NEGATIVE NEGATIVE Final    Comment: (NOTE) The Xpert Xpress SARS-CoV-2/FLU/RSV plus assay is intended as an aid in the diagnosis of influenza from Nasopharyngeal swab specimens and should not be used as a sole basis for treatment. Nasal washings and aspirates are unacceptable for Xpert Xpress SARS-CoV-2/FLU/RSV testing.  Fact Sheet for Patients: BloggerCourse.com  Fact Sheet for Healthcare Providers: SeriousBroker.it  This test is not yet approved or cleared by the [place] FDA and has been authorized for detection and/or diagnosis of SARS-CoV-2 by FDA under an Emergency Use Authorization (EUA). This EUA will remain in effect (meaning this test can be used) for the duration of the COVID-19 declaration  under Section 564(b)(1) of the Act, 21 U.S.C. section 360bbb-3(b)(1), unless the authorization is terminated or revoked.     Resp Syncytial Virus by PCR POSITIVE (A) NEGATIVE Final    Comment: (NOTE) Fact Sheet for Patients: BloggerCourse.com  Fact Sheet for Healthcare Providers: SeriousBroker.it  This test is not yet approved or cleared by the [place] FDA and has been authorized for detection and/or diagnosis of SARS-CoV-2 by FDA under an Emergency Use Authorization (EUA). This EUA will remain in effect (meaning this test can be used) for the duration of the COVID-19 declaration under Section 564(b)(1) of the Act, 21 U.S.C. section 360bbb-3(b)(1), unless the authorization is terminated or revoked.  Performed at [facility], [address],  [place], [place] [address]   Gastrointestinal Panel by PCR , Stool     Status: None   Collection Time: 02/02/24  3:27 AM   Specimen: Stool  Result Value Ref Range Status   Campylobacter species NOT DETECTED NOT DETECTED Final   Plesimonas shigelloides NOT DETECTED NOT DETECTED Final   Salmonella species NOT DETECTED NOT DETECTED Final   Yersinia enterocolitica NOT DETECTED NOT DETECTED Final   Vibrio species NOT DETECTED NOT DETECTED Final   Vibrio cholerae NOT DETECTED NOT DETECTED Final   Enteroaggregative E coli (EAEC) NOT DETECTED NOT DETECTED Final   Enteropathogenic E coli (EPEC) NOT DETECTED NOT DETECTED Final   Enterotoxigenic E coli (ETEC) NOT DETECTED NOT DETECTED Final   Shiga like toxin producing E coli (STEC) NOT DETECTED NOT DETECTED Final   Shigella/Enteroinvasive E coli (EIEC) NOT DETECTED NOT DETECTED Final   Cryptosporidium NOT DETECTED NOT DETECTED Final   Cyclospora cayetanensis NOT DETECTED NOT DETECTED Final   Entamoeba histolytica NOT DETECTED NOT DETECTED Final   Giardia lamblia NOT DETECTED NOT DETECTED Final   Adenovirus F40/41 NOT DETECTED NOT DETECTED Final    Astrovirus NOT DETECTED NOT DETECTED Final   Norovirus GI/GII NOT DETECTED NOT DETECTED Final   Rotavirus A NOT DETECTED NOT DETECTED Final   Sapovirus (I, II, IV, and V) NOT DETECTED NOT DETECTED Final    Comment: Performed at [facility], [address] [place], [place] [address]  C Difficile Quick Screen w PCR reflex     Status: Abnormal   Collection Time: 02/02/24  3:27 AM   Specimen: Stool  Result Value Ref Range Status   C Diff antigen POSITIVE (A) NEGATIVE Final   C Diff toxin NEGATIVE NEGATIVE Final   C Diff interpretation Results are indeterminate. See PCR results.  Final    Comment: Performed at [facility], [address] [place], [place] [address]  C. Diff by PCR, Reflexed     Status: None   Collection Time: 02/02/24  3:27 AM  Result Value Ref Range Status   Toxigenic C. Difficile by PCR NEGATIVE NEGATIVE Final    Comment: Patient is colonized with non toxigenic C. difficile. May not need treatment unless significant symptoms are present. Performed at [facility], [address] [place], [place] [address]      Radiology Studies: No results found.   Scheduled Meds:  allopurinol  150 mg Oral Daily   amLODipine  10 mg Oral Daily   buPROPion ER  100 mg Oral BID   feeding supplement  237 mL Oral Daily   ipratropium-albuterol  3 mL Nebulization TID   oxyCODONE  10 mg Oral QID   pantoprazole  40 mg Oral BID   polyethylene glycol  17 g Oral Daily   tamsulosin  0.4 mg Oral Daily   traZODone  100 mg Oral QHS   Continuous Infusions:     LOS: 3 days    Total time spent coordinating care:   Debarah Crape, DO [facility]  To contact the attending physician between 7A-7P please use Epic Chat. To contact the covering physician during after hours 7P-7A, please review Amion.   02/04/2024, 2:59 PM   *This document has been created with the assistance of dictation software. Please excuse typographical errors. *

## 2024-02-04 NOTE — TOC Progression Note (Addendum)
 Transition of Care [facility]) - Progression Note    Patient Details  Name: [name] MRN: [id number] Date of Birth: [birth year]  [facility] CM/SW Contact  Armanda Heritage, RN Phone Number: 02/04/2024, 3:48 PM  Clinical Narrative:    Patient active with [facility] hospice prior to admission.  CM spoke with family who report that MD is recommending a residential hospice placement for patient.   CM outreached MD, who confirms an eval for residential hospice is needed.  Family reports an interest in the facility in [place].  [facility] rep made aware of this information with request to follow up with family, [facility] facilities [place].  TOC will await outcome of [facility] conversation with family.   Expected Discharge Plan: Skilled Nursing Facility Barriers to Discharge: Continued Medical Work up  Expected Discharge Plan and Services In-house Referral: Clinical Social Work   Post Acute Care Choice: Skilled Nursing Facility Living arrangements for the past 2 months: Skilled Nursing Facility                                       Social Determinants of Health (SDOH) Interventions SDOH Screenings   Food Insecurity: No Food Insecurity (02/02/2024)  Housing: Low Risk  (02/02/2024)  Transportation Needs: No Transportation Needs (02/02/2024)  Utilities: Not At Risk (02/02/2024)  Alcohol Screen: Low Risk  (01/26/2021)  Depression (PHQ2-9): Low Risk  (09/21/2021)  Financial Resource Strain: Low Risk  (05/29/2020)  Social Connections: Unknown (02/02/2024)  Tobacco Use: Medium Risk (02/01/2024)    Readmission Risk Interventions    02/02/2024    1:58 PM 12/22/2023   11:19 AM 12/20/2023   12:46 PM  Readmission Risk Prevention Plan  Transportation Screening Complete Complete Complete  Medication Review Oceanographer) Complete Complete Complete  HRI or Home Care Consult Complete Complete Complete  SW Recovery Care/Counseling Consult Complete Complete Complete  Palliative Care  Screening Complete Complete Complete  Skilled Nursing Facility Complete Complete Complete

## 2024-02-04 NOTE — Plan of Care (Signed)
  Problem: Education: Goal: Ability to describe self-care measures that may prevent or decrease complications (Diabetes Survival Skills Education) will improve Outcome: Not Progressing Goal: Individualized Educational Video(s) Outcome: Progressing   Problem: Coping: Goal: Ability to adjust to condition or change in health will improve Outcome: Progressing   Problem: Fluid Volume: Goal: Ability to maintain a balanced intake and output will improve Outcome: Progressing   Problem: Health Behavior/Discharge Planning: Goal: Ability to identify and utilize available resources and services will improve Outcome: Not Progressing Goal: Ability to manage health-related needs will improve Outcome: Not Progressing   Problem: Metabolic: Goal: Ability to maintain appropriate glucose levels will improve Outcome: Progressing   Problem: Nutritional: Goal: Maintenance of adequate nutrition will improve Outcome: Progressing Goal: Progress toward achieving an optimal weight will improve Outcome: Not Progressing   Problem: Skin Integrity: Goal: Risk for impaired skin integrity will decrease Outcome: Not Progressing   Problem: Tissue Perfusion: Goal: Adequacy of tissue perfusion will improve Outcome: Progressing   Problem: Education: Goal: Knowledge of General Education information will improve Description: Including pain rating scale, medication(s)/side effects and non-pharmacologic comfort measures Outcome: Progressing   Problem: Health Behavior/Discharge Planning: Goal: Ability to manage health-related needs will improve Outcome: Not Progressing   Problem: Clinical Measurements: Goal: Ability to maintain clinical measurements within normal limits will improve Outcome: Progressing Goal: Will remain free from infection Outcome: Progressing Goal: Diagnostic test results will improve Outcome: Progressing Goal: Respiratory complications will improve Outcome: Progressing Goal:  Cardiovascular complication will be avoided Outcome: Progressing   Problem: Activity: Goal: Risk for activity intolerance will decrease Outcome: Progressing   Problem: Nutrition: Goal: Adequate nutrition will be maintained Outcome: Progressing   Problem: Coping: Goal: Level of anxiety will decrease Outcome: Progressing   Problem: Elimination: Goal: Will not experience complications related to bowel motility Outcome: Progressing Goal: Will not experience complications related to urinary retention Outcome: Progressing   Problem: Pain Managment: Goal: General experience of comfort will improve and/or be controlled Outcome: Not Progressing   Problem: Safety: Goal: Ability to remain free from injury will improve Outcome: Progressing   Problem: Skin Integrity: Goal: Risk for impaired skin integrity will decrease Outcome: Not Progressing   Problem: Education: Goal: Knowledge of the prescribed therapeutic regimen will improve Outcome: Not Progressing   Problem: Coping: Goal: Ability to identify and develop effective coping behavior will improve Outcome: Progressing   Problem: Clinical Measurements: Goal: Quality of life will improve Outcome: Progressing   Problem: Respiratory: Goal: Verbalizations of increased ease of respirations will increase Outcome: Progressing   Problem: Role Relationship: Goal: Family's ability to cope with current situation will improve Outcome: Progressing Goal: Ability to verbalize concerns, feelings, and thoughts to partner or family member will improve Outcome: Progressing   Problem: Pain Management: Goal: Satisfaction with pain management regimen will improve Outcome: Not Progressing

## 2024-02-04 NOTE — Progress Notes (Signed)
 [facility] Hospitalized [facility]ospice Patient Visit   [name] is a current hospice patient, with hospice diagnosis of Gastrointestinal hemorrhage, unspecified. He was admitted to [facility] 2.20.2025 with diagnosis of proctocolitis and symptoms including abdomen pain and vomiting. [facility] was notified that patient was being sent to the ED. Per Dr. Rosanne [name], hospice physician, this is a related hospital admission.    Visited with patient in his hospital room. Nephew was at bedside. Patient continues to have abdominal pain and had an episode of chest pain yesterday.  Patient is GIP appropriate due to need for evaluation of abdominal pain and IV fluids and medications.  Vital Signs: 98.2/101/18  131/95   O2 100% on RA   I&O: none recorded/350   Abnormal labs: none new   Diagnostics: None new   IV/PRN Meds: KCL IV, Zofran 4mg  IV x 1   Problem List per Dr. Debarah Crape, DO 2.23.25: Assessment & Plan:  Principal Problem:   Proctocolitis Active Problems:   Electrolyte abnormality   Lobar pneumonia (HCC)   Type 2 diabetes mellitus (HCC)   Gout   Hemiplegia of dominant side as late effect of cerebrovascular disease (HCC)   Esotropia of left eye   Benign prostatic hyperplasia   Chronic kidney disease, stage 3 unspecified (HCC)   Chronic obstructive pulmonary disease (HCC)   MGUS (monoclonal gammopathy of unknown significance)    Hospice patient Comfort Measures Only - Currently resides in nursing home under [facility] hospice. -After extensive discussion today with the patient's wife and son at bedside, they are amendable to resuming comfort measures only and discharging with hospice care.  They do not currently believe the care he is receiving at his facility is sufficient and would like to explore alternative hospice care facilities. Will discuss with TOC. - At this time we will discontinue all routine lab draws, stop all medications  that do not immediately provide the patient comfort, perform vitals once per shift, turn off all alarms. - Patient's hospice liaison's continues to follow   RSV positive - Afebrile, no leukocytosis - Continue with supportive care   Colitis - Presents with vomiting, diarrhea, abdominal pain - C. difficile negative, GI pathogen panel negative - Status post 1.5 L IV fluids, hold off on further fluids given anasarca - Hold home Lasix currently - On ceftriaxone and Flagyl currently, will discontinue given no evidence of bacterial colitis - Suspect this is viral    Lobar pneumonia - Likely aspiration pneumonia, has baseline dysphagia.  Has had multiple admissions for aspiration pneumonia in the past. Also likely increased secretions given RSV - CT showing new right lower lobe pneumonia - On ceftriaxone and azithromycin added for pneumonia coverage, will  discontinue this now given aspiration is likely to be recurrent and pt is now CMO - Continue mucolytics   Electrolyte abnormality Hypokalemia Hypomagnesemia - Likely secondary to diuretics - Replace as needed - Trend CMP   COPD - Complicated by pneumonia and RSV - Continue home meds - As needed DuoNebs   BPH - Continue Flomax   Hemiplegia of dominant side as late effect of cerebrovascular disease - Resides in a nursing home   Right brachial vein DVT -Will initiate Eliquis at loading dose   Anasarca - secondary to hypoalbuminemia   Gout - Resume allopurinol   Type 2 diabetes - Hemoglobin A1c 6.7 - Sliding scale insulin as needed   CKD stage IIIb - Cr at baseline   Small cell lung cancer status post  radiation therapy MGUS with possible conversion to multiple myeloma - Follows outpatient oncology, not felt to be a candidate for additional treatments   C. difficile positive -Antigen positive, toxin negative - Discontinue enteric precautions for now   Discharge Planning: Ongoing   Family Contact: attempted to  speak with spouse by phone   IDT: Updated   Goals of Care: DNR   Jason Newcomer, LPN Hospice hospital liaison [phone]

## 2024-02-05 DIAGNOSIS — K529 Noninfective gastroenteritis and colitis, unspecified: Secondary | ICD-10-CM | POA: Diagnosis not present

## 2024-02-05 MED ORDER — IPRATROPIUM-ALBUTEROL 0.5-2.5 (3) MG/3ML IN SOLN
3.0000 mL | Freq: Two times a day (BID) | RESPIRATORY_TRACT | Status: DC
  Administered 2024-02-05: 3 mL via RESPIRATORY_TRACT
  Filled 2024-02-05: qty 3

## 2024-02-05 NOTE — Plan of Care (Signed)
  Problem: Education: Goal: Ability to describe self-care measures that may prevent or decrease complications (Diabetes Survival Skills Education) will improve Outcome: Adequate for Discharge Goal: Individualized Educational Video(s) Outcome: Adequate for Discharge   Problem: Coping: Goal: Ability to adjust to condition or change in health will improve Outcome: Adequate for Discharge   Problem: Fluid Volume: Goal: Ability to maintain a balanced intake and output will improve Outcome: Adequate for Discharge   Problem: Health Behavior/Discharge Planning: Goal: Ability to identify and utilize available resources and services will improve Outcome: Adequate for Discharge Goal: Ability to manage health-related needs will improve Outcome: Adequate for Discharge   Problem: Metabolic: Goal: Ability to maintain appropriate glucose levels will improve Outcome: Adequate for Discharge   Problem: Nutritional: Goal: Maintenance of adequate nutrition will improve Outcome: Adequate for Discharge Goal: Progress toward achieving an optimal weight will improve Outcome: Adequate for Discharge   Problem: Skin Integrity: Goal: Risk for impaired skin integrity will decrease Outcome: Adequate for Discharge   Problem: Tissue Perfusion: Goal: Adequacy of tissue perfusion will improve Outcome: Adequate for Discharge   Problem: Education: Goal: Knowledge of General Education information will improve Description: Including pain rating scale, medication(s)/side effects and non-pharmacologic comfort measures Outcome: Adequate for Discharge   Problem: Health Behavior/Discharge Planning: Goal: Ability to manage health-related needs will improve Outcome: Adequate for Discharge   Problem: Clinical Measurements: Goal: Ability to maintain clinical measurements within normal limits will improve Outcome: Adequate for Discharge Goal: Will remain free from infection Outcome: Adequate for Discharge Goal:  Diagnostic test results will improve Outcome: Adequate for Discharge Goal: Respiratory complications will improve Outcome: Adequate for Discharge Goal: Cardiovascular complication will be avoided Outcome: Adequate for Discharge   Problem: Activity: Goal: Risk for activity intolerance will decrease Outcome: Adequate for Discharge   Problem: Nutrition: Goal: Adequate nutrition will be maintained Outcome: Adequate for Discharge   Problem: Coping: Goal: Level of anxiety will decrease Outcome: Adequate for Discharge   Problem: Elimination: Goal: Will not experience complications related to bowel motility Outcome: Adequate for Discharge Goal: Will not experience complications related to urinary retention Outcome: Adequate for Discharge   Problem: Pain Managment: Goal: General experience of comfort will improve and/or be controlled Outcome: Adequate for Discharge   Problem: Safety: Goal: Ability to remain free from injury will improve Outcome: Adequate for Discharge   Problem: Skin Integrity: Goal: Risk for impaired skin integrity will decrease Outcome: Adequate for Discharge   Problem: Education: Goal: Knowledge of the prescribed therapeutic regimen will improve Outcome: Adequate for Discharge   Problem: Coping: Goal: Ability to identify and develop effective coping behavior will improve Outcome: Adequate for Discharge   Problem: Clinical Measurements: Goal: Quality of life will improve Outcome: Adequate for Discharge   Problem: Respiratory: Goal: Verbalizations of increased ease of respirations will increase Outcome: Adequate for Discharge   Problem: Role Relationship: Goal: Family's ability to cope with current situation will improve Outcome: Adequate for Discharge Goal: Ability to verbalize concerns, feelings, and thoughts to partner or family member will improve Outcome: Adequate for Discharge   Problem: Pain Management: Goal: Satisfaction with pain  management regimen will improve Outcome: Adequate for Discharge  Discharging to inpatient hospice

## 2024-02-05 NOTE — Progress Notes (Signed)
 Report called to [facility] followede

## 2024-02-05 NOTE — TOC Transition Note (Signed)
 Transition of Care [facility]) - Discharge Note   Patient Details  Name: [name] MRN: [id number] Date of Birth: 1946/10/17  [facility] CM/SW Contact:  Beather Arbour Phone Number: 02/05/2024, 2:34 PM   Clinical Narrative:     CSW spoke with Morrie Sheldon with [facility] regarding family wanting inpatient hospice early this morning. Morrie Sheldon shared with CSW that family was informed that pt needed inpatient hospice so they are agreeable to recommendation and that [facility] with [facility] would come to assess.  CSW did call son to follow up and suggested that we wait on Misty determination of assessment for inpatient hospice. Son agreeable to hear back from [facility] about inpatient. Misty secure messaged CSW and stated that patient is appropriate and that they can accept today. Misty did share that family was on board and provided number for report which was given to nurse.   Final next level of care: Hospice Medical Facility Barriers to Discharge: Barriers Resolved   Patient Goals and CMS Choice Patient states their goals for this hospitalization and ongoing recovery are:: [facility] CMS Medicare.gov Compare Post Acute Care list provided to:: Patient Represenative (must comment) (SonCaryn Bee) Choice offered to / list presented to : Adult Children [facility] ownership interest in [facility].provided to:: Adult Children    Discharge Placement              Patient chooses bed at:  [facility]) Patient to be transferred to facility by: Ambulance Name of family member notified: Kalix Meinecke Patient and family notified of of transfer: 02/05/24  Discharge Plan and Services Additional resources added to the After Visit Summary for   In-house Referral: Clinical Social Work   Post Acute Care Choice: Skilled Nursing Facility          DME Arranged: N/A         HH Arranged: NA HH Agency: NA        Social Drivers of Health (SDOH)  Interventions SDOH Screenings   Food Insecurity: No Food Insecurity (02/02/2024)  Housing: Low Risk  (02/02/2024)  Transportation Needs: No Transportation Needs (02/02/2024)  Utilities: Not At Risk (02/02/2024)  Alcohol Screen: Low Risk  (01/26/2021)  Depression (PHQ2-9): Low Risk  (09/21/2021)  Financial Resource Strain: Low Risk  (05/29/2020)  Social Connections: Unknown (02/02/2024)  Tobacco Use: Medium Risk (02/01/2024)     Readmission Risk Interventions    02/05/2024    1:34 PM 02/02/2024    1:58 PM 12/22/2023   11:19 AM  Readmission Risk Prevention Plan  Transportation Screening Complete Complete Complete  Medication Review Oceanographer) Complete Complete Complete  HRI or Home Care Consult Complete Complete Complete  SW Recovery Care/Counseling Consult Complete Complete Complete  Palliative Care Screening Complete Complete Complete  Skilled Nursing Facility Complete Complete Complete

## 2024-02-05 NOTE — Hospital Course (Signed)
 [name] is a 78 year old male with history of prior CVA, residual right-sided hemiplegia and dysphagia, COPD, gout, hypertension, MGUS, diabetes, hypothyroidism, CKD stage IIIb, BPH, who currently resides in a nursing home under hospice care.  He presents on this admission at the urging of his wife from the nursing home due to vomiting, diarrhea, abdominal pain, right upper extremity swelling.  In the ED he was found to be hypertensive and hypokalemic.  CT abdomen pelvis revealed rectosigmoid colon thickening suggestive of proctocolitis as well as a new right lower lobe pneumonia with small bilateral pleural effusions.  Patient was also found to be positive for RSV.  He was initially started on antibiotics and admitted for treatment.  Patient's right upper extremity was positive for DVT and he was started on anticoagulation.  Given the chronicity of his problems and frequent readmissions for aspiration pneumonia we discussed his goals of care.  Patient was very clear that he would like to stop coming to the hospital and be allowed to pass peacefully.  We had multiple discussions with the patient's family, his wife and son, and after further discussion they decided to proceed with hospice care outpatient.  His stay was somewhat delayed as we arranged an alternative outpatient hospice facility per their request.  He is discharging directly to the hospice facility today.  Hospice patient Comfort Measures Only - Currently resides in nursing home under [facility] hosp[facility]. -After extensive discussion with the patient's wife and son at bedside, they are amendable to resuming comfort measures only and discharging with hospice care.  - At this time we will discontinue all routine lab draws, stop all medications that do not immediately provide the patient comfort, perform vitals once per shift, turn off all alarms.   RSV positive - Afebrile, no leukocytosis - Continue with supportive care    Colitis - Presents with vomiting, diarrhea, abdominal pain - C. difficile negative, GI pathogen panel negative - Status post 1.5 L IV fluids, hold off on further fluids given anasarca - Hold home Lasix currently - On ceftriaxone and Flagyl currently, will discontinue given no evidence of bacterial colitis - Suspect this is viral    Lobar pneumonia - Likely aspiration pneumonia, has baseline dysphagia.  Has had multiple admissions for aspiration pneumonia in the past. Also likely increased secretions given RSV - CT showing new right lower lobe pneumonia - On ceftriaxone and azithromycin added for pneumonia coverage, will  discontinue this now given aspiration is likely to be recurrent and pt is now CMO - Continue mucolytics   Electrolyte abnormality Hypokalemia Hypomagnesemia - Likely secondary to diuretics   COPD - Complicated by pneumonia and RSV - Continue home meds - As needed DuoNebs   BPH - Continue Flomax   Hemiplegia of dominant side as late effect of cerebrovascular disease - Resides in a nursing home   Right brachial vein DVT -Will initiate Eliquis at loading dose   Anasarca - secondary to hypoalbuminemia   Gout - Resume allopurinol   Type 2 diabetes - Hemoglobin A1c 6.7 - Sliding scale insulin as needed   CKD stage IIIb - Cr at baseline   Small cell lung cancer status post radiation therapy MGUS with possible conversion to multiple myeloma - Follows outpatient oncology, not felt to be a candidate for additional treatments   C. Difficile, ruled out -Antigen positive, toxin negative - Discontinue enteric precautions

## 2024-02-05 NOTE — Discharge Summary (Signed)
 Physician Discharge Summary   Patient: [name] MRN: [id number] DOB: [birth year]  Admit date:     02/01/2024  Discharge date: 02/05/24  Discharge Physician: Debarah Crape   PCP: System, Provider Not In   Recommendations at discharge:    Discharging directly to [facility]  Discharge Diagnoses: Principal Problem:   Proctocolitis Active Problems:   Electrolyte abnormality   Lobar pneumonia (HCC)   Type 2 diabetes mellitus (HCC)   Gout   Hemiplegia of dominant side as late effect of cerebrovascular disease (HCC)   Esotropia of left eye   Benign prostatic hyperplasia   Chronic kidney disease, stage 3 unspecified (HCC)   Chronic obstructive pulmonary disease (HCC)   MGUS (monoclonal gammopathy of unknown significance)  Resolved Problems:   CAP (community acquired pneumonia)  Hospital Course: [name] is a 78 year old male with history of prior CVA, residual right-sided hemiplegia and dysphagia, COPD, gout, hypertension, MGUS, diabetes, hypothyroidism, CKD stage IIIb, BPH, who currently resides in a nursing home under hospice care.  He presents on this admission at the urging of his wife from the nursing home due to vomiting, diarrhea, abdominal pain, right upper extremity swelling.  In the ED he was found to be hypertensive and hypokalemic.  CT abdomen pelvis revealed rectosigmoid colon thickening suggestive of proctocolitis as well as a new right lower lobe pneumonia with small bilateral pleural effusions.  Patient was also found to be positive for RSV.  He was initially started on antibiotics and admitted for treatment.  Patient's right upper extremity was positive for DVT and he was started on anticoagulation.  Given the chronicity of his problems and frequent readmissions for aspiration pneumonia we discussed his goals of care.  Patient was very clear that he would like to stop coming to the hospital and be allowed to pass peacefully.  We had multiple discussions  with the patient's family, his wife and son, and after further discussion they decided to proceed with hospice care outpatient.  His stay was somewhat delayed as we arranged an alternative outpatient hospice facility per their request.  He is discharging directly to the hospice facility today.  Hospice patient Comfort Measures Only - Currently resides in nursing home under [facility] collective hospice. -After extensive discussion with the patient's wife and son at bedside, they are amendable to resuming comfort measures only and discharging with hospice care.  - At this time we will discontinue all routine lab draws, stop all medications that do not immediately provide the patient comfort, perform vitals once per shift, turn off all alarms.   RSV positive - Afebrile, no leukocytosis - Continue with supportive care   Colitis - Presents with vomiting, diarrhea, abdominal pain - C. difficile negative, GI pathogen panel negative - Status post 1.5 L IV fluids, hold off on further fluids given anasarca - Hold home Lasix currently - On ceftriaxone and Flagyl currently, will discontinue given no evidence of bacterial colitis - Suspect this is viral    Lobar pneumonia - Likely aspiration pneumonia, has baseline dysphagia.  Has had multiple admissions for aspiration pneumonia in the past. Also likely increased secretions given RSV - CT showing new right lower lobe pneumonia - On ceftriaxone and azithromycin added for pneumonia coverage, will  discontinue this now given aspiration is likely to be recurrent and pt is now CMO - Continue mucolytics   Electrolyte abnormality Hypokalemia Hypomagnesemia - Likely secondary to diuretics   COPD - Complicated by pneumonia and RSV - Continue home meds -  As needed DuoNebs   BPH - Continue Flomax   Hemiplegia of dominant side as late effect of cerebrovascular disease - Resides in a nursing home   Right brachial vein DVT -Will initiate Eliquis at  loading dose   Anasarca - secondary to hypoalbuminemia   Gout - Resume allopurinol   Type 2 diabetes - Hemoglobin A1c 6.7 - Sliding scale insulin as needed   CKD stage IIIb - Cr at baseline   Small cell lung cancer status post radiation therapy MGUS with possible conversion to multiple myeloma - Follows outpatient oncology, not felt to be a candidate for additional treatments   C. Difficile, ruled out -Antigen positive, toxin negative - Discontinue enteric precautions         Consultants: n/a Procedures performed: n/a  Disposition: Hospice care Diet recommendation:  Discharge Diet Orders (From admission, onward)     Start     Ordered   02/05/24 0000  Diet general        02/05/24 1418           Regular diet DISCHARGE MEDICATION: Allergies as of 02/05/2024   No Known Allergies      Medication List     STOP taking these medications    benzonatate 200 MG capsule Commonly known as: TESSALON   furosemide 40 MG tablet Commonly known as: LASIX   HumaLOG KwikPen 100 UNIT/ML KwikPen Generic drug: insulin lispro   insulin aspart 100 UNIT/ML injection Commonly known as: novoLOG   insulin glargine-yfgn 100 UNIT/ML Pen Commonly known as: SEMGLEE   metoprolol tartrate 50 MG tablet Commonly known as: LOPRESSOR   Potassium Chloride ER 20 MEQ Tbcr   sucralfate 1 GM/10ML suspension Commonly known as: CARAFATE       TAKE these medications    acetaminophen 650 MG CR tablet Commonly known as: TYLENOL Take 650 mg by mouth every 4 (four) hours as needed for pain.   allopurinol 300 MG tablet Commonly known as: ZYLOPRIM Take 0.5 tablets (150 mg total) by mouth daily. What changed: how much to take   amLODipine 10 MG tablet Commonly known as: NORVASC Take 1 tablet (10 mg total) by mouth daily.   bisacodyl 10 MG suppository Commonly known as: DULCOLAX Place 10 mg rectally daily as needed for moderate constipation.   buPROPion ER 100 MG 12 hr  tablet Commonly known as: WELLBUTRIN SR Take 100 mg by mouth 2 (two) times daily.   gabapentin 300 MG capsule Commonly known as: NEURONTIN Take 1 capsule (300 mg total) by mouth 2 (two) times daily.   ipratropium-albuterol 0.5-2.5 (3) MG/3ML Soln Commonly known as: DUONEB Take 3 mLs by nebulization every 6 (six) hours.   NUTRITIONAL SUPPLEMENT PO Take 1 Dose by mouth daily. Frozen Nutritional Cup   ondansetron 4 MG tablet Commonly known as: ZOFRAN Take 4 mg by mouth every 8 (eight) hours as needed for nausea or vomiting.   Oxycodone HCl 10 MG Tabs Take 10 mg by mouth 4 (four) times daily.   pantoprazole 40 MG tablet Commonly known as: PROTONIX Take 1 tablet (40 mg total) by mouth 2 (two) times daily.   polyethylene glycol powder 17 GM/SCOOP powder Commonly known as: MiraLax Take one capful daily. Hold for diarrhea. What changed:  how much to take how to take this when to take this additional instructions   tamsulosin 0.4 MG Caps capsule Commonly known as: FLOMAX TAKE 1 CAPSULE(0.4 MG) BY MOUTH DAILY   traZODone 100 MG tablet Commonly known as: DESYREL Take  100 mg by mouth at bedtime.        Discharge Exam: Filed Weights   02/01/24 1608 02/02/24 1401  Weight: 101.4 kg 97.8 kg   General exam: Appears calm and comfortable, weak appearing, diffusely anasarcic Respiratory system: No work of breathing, symmetric chest wall expansion, respirations Cardiovascular system: S1 & S2 heard, RRR.  Gastrointestinal system: Abdomen is nondistended, soft and nontender.  Neuro: Alert and oriented.  Speech is slurred and at times unintelligible, does require prompting Extremities: significant right arm swelling.  Skin: No rashes, lesions Psychiatry:  Mood & affect appropriate for situation.   Condition at discharge: poor  The results of significant diagnostics from this hospitalization (including imaging, microbiology, ancillary and laboratory) are listed below for  reference.   Imaging Studies: US Venous Img Upper Uni Right(DVT) Result Date: 02/02/2024 CLINICAL DATA:  Right upper extremity swelling. EXAM: Right UPPER EXTREMITY VENOUS DOPPLER ULTRASOUND TECHNIQUE: Gray-scale sonography with graded compression, as well as color Doppler and duplex ultrasound were performed to evaluate the upper extremity deep venous system from the level of the subclavian vein and including the jugular, axillary, basilic, radial, ulnar and upper cephalic vein. Spectral Doppler was utilized to evaluate flow at rest and with distal augmentation maneuvers. Exam is somewhat limited as patient arm was contracted. COMPARISON:  None Available. FINDINGS: Contralateral Subclavian Vein: Respiratory phasicity is normal and symmetric with the symptomatic side. No evidence of thrombus. Normal compressibility. Internal Jugular Vein: No evidence of thrombus. Normal compressibility, respiratory phasicity and response to augmentation. Subclavian Vein: No evidence of thrombus. Normal compressibility, respiratory phasicity and response to augmentation. Axillary Vein: No evidence of thrombus. Normal compressibility, respiratory phasicity and response to augmentation. Cephalic Vein: Not visualized. Basilic Vein: No evidence of thrombus. Normal compressibility, respiratory phasicity and response to augmentation. Brachial Veins: Occlusive thrombus is noted in 1 brachial vein without flow or compressibility. The other brachial vein is not visualized. Radial Veins: No evidence of thrombus. Normal compressibility, respiratory phasicity and response to augmentation. Ulnar Veins: Not visualized. Venous Reflux:  None visualized. Other Findings:  None visualized. IMPRESSION: Occlusive deep venous thrombosis is noted in 1 right brachial vein. Electronically Signed   By: Lupita Raider M.D.   On: 02/02/2024 12:50   DG Chest Port 1 View Result Date: 02/01/2024 CLINICAL DATA:  Shortness of breath with chest and abdominal  pain. EXAM: PORTABLE CHEST 1 VIEW COMPARISON:  12/16/2023. FINDINGS: The heart size and mediastinal contours are stable stable bandlike opacity is noted in the left upper lobe. There are surgical changes in the mid right lung. No acute infiltrate, effusion or pneumothorax is seen. No acute osseous abnormality. IMPRESSION: 1. No acute abnormality. 2. Stable bandlike opacity in the left upper lobe. Electronically Signed   By: Thornell Sartorius M.D.   On: 02/01/2024 19:30   CT ABDOMEN PELVIS W CONTRAST Result Date: 02/01/2024 CLINICAL DATA:  Abdominal pain. EXAM: CT ABDOMEN AND PELVIS WITH CONTRAST TECHNIQUE: Multidetector CT imaging of the abdomen and pelvis was performed using the standard protocol following bolus administration of intravenous contrast. RADIATION DOSE REDUCTION: This exam was performed according to the departmental dose-optimization program which includes automated exposure control, adjustment of the mA and/or kV according to patient size and/or use of iterative reconstruction technique. CONTRAST:  80mL OMNIPAQUE IOHEXOL 300 MG/ML  SOLN COMPARISON:  CT abdomen pelvis dated December 16, 2023. FINDINGS: Lower chest: New small bilateral pleural effusions. New peribronchial thickening and patchy consolidation in the posterior right lower lobe. Hepatobiliary: No focal liver  abnormality is seen. Status post cholecystectomy. No biliary dilatation. Pancreas: Unremarkable. No pancreatic ductal dilatation or surrounding inflammatory changes. Spleen: 1.9 cm round hypodense lesion in the medial spleen, previously 1.8 cm in August 2023, indeterminate but benign given stability. Normal in size. Adrenals/Urinary Tract: Bilateral adrenal gland thickening again noted. Multiple bilateral renal simple cysts again noted. No follow-up imaging is recommended. No renal calculi or hydronephrosis. The bladder is unremarkable. Stomach/Bowel: Distended stomach. Unremarkable small bowel. Wall thickening of the rectosigmoid colon.  Diffuse colonic diverticulosis. Normal appendix. Vascular/Lymphatic: Aortic atherosclerosis. No enlarged abdominal or pelvic lymph nodes. Reproductive: Unchanged mild prostatomegaly. Other: No free fluid or pneumoperitoneum. Musculoskeletal: No acute or significant osseous findings. IMPRESSION: 1. Wall thickening of the rectosigmoid colon, suggestive of proctocolitis. 2. New right lower lobe pneumonia with small bilateral pleural effusions. 3.  Aortic Atherosclerosis (ICD10-I70.0). Electronically Signed   By: Obie Dredge M.D.   On: 02/01/2024 18:26    Microbiology: Results for orders placed or performed during the hospital encounter of 02/01/24  Resp panel by RT-PCR (RSV, Flu A&B, Covid) Anterior Nasal Swab     Status: Abnormal   Collection Time: 02/01/24  7:44 PM   Specimen: Anterior Nasal Swab  Result Value Ref Range Status   SARS Coronavirus 2 by RT PCR NEGATIVE NEGATIVE Final    Comment: (NOTE) SARS-CoV-2 target nucleic acids are NOT DETECTED.  The SARS-CoV-2 RNA is generally detectable in upper respiratory specimens during the acute phase of infection. The lowest concentration of SARS-CoV-2 viral copies this assay can detect is 138 copies/mL. A negative result does not preclude SARS-Cov-2 infection and should not be used as the sole basis for treatment or other patient management decisions. A negative result may occur with  improper specimen collection/handling, submission of specimen other than nasopharyngeal swab, presence of viral mutation(s) within the areas targeted by this assay, and inadequate number of viral copies(<138 copies/mL). A negative result must be combined with clinical observations, patient history, and epidemiological information. The expected result is Negative.  Fact Sheet for Patients:  BloggerCourse.com  Fact Sheet for Healthcare Providers:  SeriousBroker.it  This test is no t yet approved or cleared by  the [place] FDA and  has been authorized for detection and/or diagnosis of SARS-CoV-2 by FDA under an Emergency Use Authorization (EUA). This EUA will remain  in effect (meaning this test can be used) for the duration of the COVID-19 declaration under Section 564(b)(1) of the Act, 21 U.S.C.section 360bbb-3(b)(1), unless the authorization is terminated  or revoked sooner.       Influenza A by PCR NEGATIVE NEGATIVE Final   Influenza B by PCR NEGATIVE NEGATIVE Final    Comment: (NOTE) The Xpert Xpress SARS-CoV-2/FLU/RSV plus assay is intended as an aid in the diagnosis of influenza from Nasopharyngeal swab specimens and should not be used as a sole basis for treatment. Nasal washings and aspirates are unacceptable for Xpert Xpress SARS-CoV-2/FLU/RSV testing.  Fact Sheet for Patients: BloggerCourse.com  Fact Sheet for Healthcare Providers: SeriousBroker.it  This test is not yet approved or cleared by the [place] FDA and has been authorized for detection and/or diagnosis of SARS-CoV-2 by FDA under an Emergency Use Authorization (EUA). This EUA will remain in effect (meaning this test can be used) for the duration of the COVID-19 declaration under Section 564(b)(1) of the Act, 21 U.S.C. section 360bbb-3(b)(1), unless the authorization is terminated or revoked.     Resp Syncytial Virus by PCR POSITIVE (A) NEGATIVE Final    Comment: (NOTE)  Fact Sheet for Patients: BloggerCourse.com  Fact Sheet for Healthcare Providers: SeriousBroker.it  This test is not yet approved or cleared by the [place] FDA and has been authorized for detection and/or diagnosis of SARS-CoV-2 by FDA under an Emergency Use Authorization (EUA). This EUA will remain in effect (meaning this test can be used) for the duration of the COVID-19 declaration under Section 564(b)(1) of the Act, 21  U.S.C. section 360bbb-3(b)(1), unless the authorization is terminated or revoked.  Performed at [facility], [address], [place], [place] [address]   Gastrointestinal Panel by PCR , Stool     Status: None   Collection Time: 02/02/24  3:27 AM   Specimen: Stool  Result Value Ref Range Status   Campylobacter species NOT DETECTED NOT DETECTED Final   Plesimonas shigelloides NOT DETECTED NOT DETECTED Final   Salmonella species NOT DETECTED NOT DETECTED Final   Yersinia enterocolitica NOT DETECTED NOT DETECTED Final   Vibrio species NOT DETECTED NOT DETECTED Final   Vibrio cholerae NOT DETECTED NOT DETECTED Final   Enteroaggregative E coli (EAEC) NOT DETECTED NOT DETECTED Final   Enteropathogenic E coli (EPEC) NOT DETECTED NOT DETECTED Final   Enterotoxigenic E coli (ETEC) NOT DETECTED NOT DETECTED Final   Shiga like toxin producing E coli (STEC) NOT DETECTED NOT DETECTED Final   Shigella/Enteroinvasive E coli (EIEC) NOT DETECTED NOT DETECTED Final   Cryptosporidium NOT DETECTED NOT DETECTED Final   Cyclospora cayetanensis NOT DETECTED NOT DETECTED Final   Entamoeba histolytica NOT DETECTED NOT DETECTED Final   Giardia lamblia NOT DETECTED NOT DETECTED Final   Adenovirus F40/41 NOT DETECTED NOT DETECTED Final   Astrovirus NOT DETECTED NOT DETECTED Final   Norovirus GI/GII NOT DETECTED NOT DETECTED Final   Rotavirus A NOT DETECTED NOT DETECTED Final   Sapovirus (I, II, IV, and V) NOT DETECTED NOT DETECTED Final    Comment: Performed at [facility], [address] [place], [place] [address]  C Difficile Quick Screen w PCR reflex     Status: Abnormal   Collection Time: 02/02/24  3:27 AM   Specimen: Stool  Result Value Ref Range Status   C Diff antigen POSITIVE (A) NEGATIVE Final   C Diff toxin NEGATIVE NEGATIVE Final   C Diff interpretation Results are indeterminate. See PCR results.  Final    Comment: Performed at [facility], [address], [place], [place] [address]   C. Diff by PCR, Reflexed     Status: None   Collection Time: 02/02/24  3:27 AM  Result Value Ref Range Status   Toxigenic C. Difficile by PCR NEGATIVE NEGATIVE Final    Comment: Patient is colonized with non toxigenic C. difficile. May not need treatment unless significant symptoms are present. Performed at [facility], [address] [place], [place] [address]    *Note: Due to a large number of results and/or encounters for the requested time period, some results have not been displayed. A complete set of results can be found in Results Review.    Labs: CBC: Recent Labs  Lab 02/01/24 1647 02/02/24 0634 02/03/24 0919  WBC 9.9 9.4 9.3  NEUTROABS 8.2*  --  7.9*  HGB 9.4* 9.4* 10.8*  HCT 30.0* 30.1* 33.6*  MCV 68.6* 68.3* 66.9*  PLT 307 309 356   Basic Metabolic Panel: Recent Labs  Lab 02/01/24 1647 02/02/24 0634 02/03/24 0919  NA 134* 135 136  K 2.6* 2.9* 2.4*  CL 97* 102 101  CO2 28 26  23  GLUCOSE 209* 142* 101*  BUN 19 17 14   CREATININE 1.59* 1.43* 1.30*  CALCIUM 7.6* 7.6* 8.6*  MG 1.0* 1.2*  --    Liver Function Tests: Recent Labs  Lab 02/01/24 1647 02/03/24 0919  AST 10* 10*  ALT 9 8  ALKPHOS 82 88  BILITOT 0.4 0.6  PROT 5.1* 5.6*  ALBUMIN 2.0* 2.2*   CBG: Recent Labs  Lab 02/02/24 1646 02/02/24 2102 02/03/24 0723 02/03/24 1219 02/03/24 1645  GLUCAP 139* 149* 100* 178* 193*    Discharge time spent: greater than 30 minutes.  Signed: Debarah Crape, DO [facility] 02/05/2024

## 2024-02-14 DIAGNOSIS — R54 Age-related physical debility: Secondary | ICD-10-CM | POA: Diagnosis not present

## 2024-02-14 DIAGNOSIS — G894 Chronic pain syndrome: Secondary | ICD-10-CM | POA: Diagnosis not present

## 2024-02-14 DIAGNOSIS — R627 Adult failure to thrive: Secondary | ICD-10-CM | POA: Diagnosis not present

## 2024-02-15 DIAGNOSIS — Z515 Encounter for palliative care: Secondary | ICD-10-CM | POA: Diagnosis not present

## 2024-02-15 DIAGNOSIS — R627 Adult failure to thrive: Secondary | ICD-10-CM | POA: Diagnosis not present

## 2024-02-16 DIAGNOSIS — R54 Age-related physical debility: Secondary | ICD-10-CM | POA: Diagnosis not present

## 2024-02-16 DIAGNOSIS — R627 Adult failure to thrive: Secondary | ICD-10-CM | POA: Diagnosis not present

## 2024-02-16 DIAGNOSIS — G894 Chronic pain syndrome: Secondary | ICD-10-CM | POA: Diagnosis not present

## 2024-03-05 DIAGNOSIS — L84 Corns and callosities: Secondary | ICD-10-CM | POA: Diagnosis not present

## 2024-03-05 DIAGNOSIS — E1151 Type 2 diabetes mellitus with diabetic peripheral angiopathy without gangrene: Secondary | ICD-10-CM | POA: Diagnosis not present

## 2024-03-20 DIAGNOSIS — L03113 Cellulitis of right upper limb: Secondary | ICD-10-CM | POA: Diagnosis not present

## 2024-03-21 DIAGNOSIS — I10 Essential (primary) hypertension: Secondary | ICD-10-CM | POA: Diagnosis not present

## 2024-03-21 DIAGNOSIS — R627 Adult failure to thrive: Secondary | ICD-10-CM | POA: Diagnosis not present

## 2024-03-21 DIAGNOSIS — Z515 Encounter for palliative care: Secondary | ICD-10-CM | POA: Diagnosis not present

## 2024-03-21 DIAGNOSIS — G894 Chronic pain syndrome: Secondary | ICD-10-CM | POA: Diagnosis not present

## 2024-03-21 DIAGNOSIS — I2510 Atherosclerotic heart disease of native coronary artery without angina pectoris: Secondary | ICD-10-CM | POA: Diagnosis not present

## 2024-03-26 DIAGNOSIS — G4700 Insomnia, unspecified: Secondary | ICD-10-CM | POA: Diagnosis not present

## 2024-03-29 DIAGNOSIS — M6281 Muscle weakness (generalized): Secondary | ICD-10-CM | POA: Diagnosis not present

## 2024-03-29 DIAGNOSIS — E119 Type 2 diabetes mellitus without complications: Secondary | ICD-10-CM | POA: Diagnosis not present

## 2024-03-29 DIAGNOSIS — L89620 Pressure ulcer of left heel, unstageable: Secondary | ICD-10-CM | POA: Diagnosis not present

## 2024-03-29 DIAGNOSIS — I69359 Hemiplegia and hemiparesis following cerebral infarction affecting unspecified side: Secondary | ICD-10-CM | POA: Diagnosis not present

## 2024-03-29 DIAGNOSIS — L89013 Pressure ulcer of right elbow, stage 3: Secondary | ICD-10-CM | POA: Diagnosis not present

## 2024-04-03 DIAGNOSIS — I69359 Hemiplegia and hemiparesis following cerebral infarction affecting unspecified side: Secondary | ICD-10-CM | POA: Diagnosis not present

## 2024-04-03 DIAGNOSIS — E119 Type 2 diabetes mellitus without complications: Secondary | ICD-10-CM | POA: Diagnosis not present

## 2024-04-03 DIAGNOSIS — L89620 Pressure ulcer of left heel, unstageable: Secondary | ICD-10-CM | POA: Diagnosis not present

## 2024-04-03 DIAGNOSIS — M6281 Muscle weakness (generalized): Secondary | ICD-10-CM | POA: Diagnosis not present

## 2024-04-03 DIAGNOSIS — L89013 Pressure ulcer of right elbow, stage 3: Secondary | ICD-10-CM | POA: Diagnosis not present

## 2024-04-11 DEATH — deceased
